# Patient Record
Sex: Male | Born: 1935
Health system: Southern US, Community
[De-identification: ages and names within clinical notes are randomized; demographics above are authoritative.]

## PROBLEM LIST (undated history)

## (undated) DIAGNOSIS — D472 Monoclonal gammopathy: Secondary | ICD-10-CM

## (undated) DIAGNOSIS — I480 Paroxysmal atrial fibrillation: Secondary | ICD-10-CM

## (undated) DIAGNOSIS — I498 Other specified cardiac arrhythmias: Secondary | ICD-10-CM

## (undated) DIAGNOSIS — Z8679 Personal history of other diseases of the circulatory system: Secondary | ICD-10-CM

## (undated) DIAGNOSIS — G7000 Myasthenia gravis without (acute) exacerbation: Secondary | ICD-10-CM

## (undated) DIAGNOSIS — E0590 Thyrotoxicosis, unspecified without thyrotoxic crisis or storm: Secondary | ICD-10-CM

## (undated) DIAGNOSIS — I350 Nonrheumatic aortic (valve) stenosis: Secondary | ICD-10-CM

## (undated) DIAGNOSIS — R0600 Dyspnea, unspecified: Secondary | ICD-10-CM

## (undated) DIAGNOSIS — K5792 Diverticulitis of intestine, part unspecified, without perforation or abscess without bleeding: Secondary | ICD-10-CM

## (undated) DIAGNOSIS — I2510 Atherosclerotic heart disease of native coronary artery without angina pectoris: Secondary | ICD-10-CM

## (undated) DIAGNOSIS — E785 Hyperlipidemia, unspecified: Secondary | ICD-10-CM

## (undated) DIAGNOSIS — R011 Cardiac murmur, unspecified: Secondary | ICD-10-CM

## (undated) DIAGNOSIS — G909 Disorder of the autonomic nervous system, unspecified: Secondary | ICD-10-CM

## (undated) DIAGNOSIS — R7303 Prediabetes: Secondary | ICD-10-CM

## (undated) DIAGNOSIS — D6859 Other primary thrombophilia: Secondary | ICD-10-CM

## (undated) DIAGNOSIS — I5022 Chronic systolic (congestive) heart failure: Secondary | ICD-10-CM

## (undated) DIAGNOSIS — M1990 Unspecified osteoarthritis, unspecified site: Secondary | ICD-10-CM

## (undated) DIAGNOSIS — I495 Sick sinus syndrome: Secondary | ICD-10-CM

## (undated) DIAGNOSIS — I639 Cerebral infarction, unspecified: Secondary | ICD-10-CM

## (undated) DIAGNOSIS — N400 Enlarged prostate without lower urinary tract symptoms: Secondary | ICD-10-CM

## (undated) DIAGNOSIS — E039 Hypothyroidism, unspecified: Secondary | ICD-10-CM

## (undated) HISTORY — PX: OTHER SURGICAL HISTORY: SHX169

## (undated) HISTORY — DX: Other specified cardiac arrhythmias: I49.8

## (undated) HISTORY — DX: Dyspnea, unspecified: R06.00

## (undated) HISTORY — DX: Unspecified osteoarthritis, unspecified site: M19.90

## (undated) HISTORY — PX: APPENDECTOMY: SHX54

## (undated) HISTORY — DX: Atherosclerotic heart disease of native coronary artery without angina pectoris: I25.10

## (undated) HISTORY — PX: COLONOSCOPY: SHX174

## (undated) HISTORY — DX: Monoclonal gammopathy: D47.2

## (undated) HISTORY — DX: Disorder of the autonomic nervous system, unspecified: G90.9

## (undated) HISTORY — PX: CARDIOVERSION: SHX1299

## (undated) HISTORY — DX: Chronic systolic (congestive) heart failure: I50.22

## (undated) HISTORY — DX: Other primary thrombophilia: D68.59

## (undated) HISTORY — DX: Hyperlipidemia, unspecified: E78.5

## (undated) HISTORY — DX: Sick sinus syndrome: I49.5

## (undated) HISTORY — DX: Prediabetes: R73.03

## (undated) HISTORY — DX: Paroxysmal atrial fibrillation: I48.0

## (undated) HISTORY — DX: Personal history of other diseases of the circulatory system: Z86.79

## (undated) HISTORY — DX: Myasthenia gravis without (acute) exacerbation: G70.00

## (undated) HISTORY — DX: Diverticulitis of intestine, part unspecified, without perforation or abscess without bleeding: K57.92

## (undated) HISTORY — DX: Nonrheumatic aortic (valve) stenosis: I35.0

## (undated) HISTORY — PX: CARDIAC CATHETERIZATION: SHX172

---

## 1989-03-01 HISTORY — PX: PACEMAKER INSERTION: SHX728

## 1998-02-06 ENCOUNTER — Ambulatory Visit (HOSPITAL_COMMUNITY): Admission: RE | Admit: 1998-02-06 | Discharge: 1998-02-06 | Payer: Self-pay | Admitting: Cardiovascular Disease

## 1998-02-06 ENCOUNTER — Encounter: Payer: Self-pay | Admitting: Cardiovascular Disease

## 2000-04-15 ENCOUNTER — Encounter: Payer: Self-pay | Admitting: Internal Medicine

## 2000-04-15 ENCOUNTER — Ambulatory Visit (HOSPITAL_COMMUNITY): Admission: RE | Admit: 2000-04-15 | Discharge: 2000-04-15 | Payer: Self-pay | Admitting: Internal Medicine

## 2002-08-24 ENCOUNTER — Ambulatory Visit (HOSPITAL_COMMUNITY): Admission: RE | Admit: 2002-08-24 | Discharge: 2002-08-25 | Payer: Self-pay | Admitting: Cardiology

## 2003-01-07 ENCOUNTER — Encounter: Admission: RE | Admit: 2003-01-07 | Discharge: 2003-01-07 | Payer: Self-pay | Admitting: General Surgery

## 2003-01-18 ENCOUNTER — Encounter: Admission: RE | Admit: 2003-01-18 | Discharge: 2003-01-18 | Payer: Self-pay | Admitting: General Surgery

## 2003-02-18 ENCOUNTER — Encounter (INDEPENDENT_AMBULATORY_CARE_PROVIDER_SITE_OTHER): Payer: Self-pay | Admitting: Specialist

## 2003-02-18 ENCOUNTER — Observation Stay (HOSPITAL_COMMUNITY): Admission: RE | Admit: 2003-02-18 | Discharge: 2003-02-20 | Payer: Self-pay | Admitting: General Surgery

## 2003-05-10 ENCOUNTER — Inpatient Hospital Stay (HOSPITAL_COMMUNITY): Admission: AD | Admit: 2003-05-10 | Discharge: 2003-05-13 | Payer: Self-pay | Admitting: Cardiology

## 2003-05-20 ENCOUNTER — Ambulatory Visit (HOSPITAL_COMMUNITY): Admission: RE | Admit: 2003-05-20 | Discharge: 2003-05-20 | Payer: Self-pay | Admitting: Cardiology

## 2003-07-02 ENCOUNTER — Ambulatory Visit (HOSPITAL_COMMUNITY): Admission: RE | Admit: 2003-07-02 | Discharge: 2003-07-02 | Payer: Self-pay | Admitting: *Deleted

## 2004-02-14 ENCOUNTER — Ambulatory Visit: Payer: Self-pay | Admitting: Cardiology

## 2004-02-28 ENCOUNTER — Ambulatory Visit: Payer: Self-pay

## 2004-09-14 ENCOUNTER — Ambulatory Visit: Payer: Self-pay | Admitting: Internal Medicine

## 2004-10-01 ENCOUNTER — Ambulatory Visit: Payer: Self-pay | Admitting: Internal Medicine

## 2004-10-07 ENCOUNTER — Ambulatory Visit: Payer: Self-pay | Admitting: Internal Medicine

## 2004-10-21 ENCOUNTER — Ambulatory Visit: Payer: Self-pay | Admitting: Internal Medicine

## 2004-11-17 ENCOUNTER — Ambulatory Visit: Payer: Self-pay | Admitting: Cardiology

## 2004-11-23 ENCOUNTER — Ambulatory Visit: Payer: Self-pay | Admitting: Cardiology

## 2004-11-23 ENCOUNTER — Encounter: Payer: Self-pay | Admitting: Cardiology

## 2004-11-23 ENCOUNTER — Ambulatory Visit: Payer: Self-pay

## 2005-06-28 ENCOUNTER — Ambulatory Visit: Payer: Self-pay | Admitting: Cardiology

## 2005-12-31 ENCOUNTER — Ambulatory Visit: Payer: Self-pay | Admitting: Internal Medicine

## 2006-01-28 ENCOUNTER — Encounter: Payer: Self-pay | Admitting: Cardiology

## 2006-01-28 ENCOUNTER — Ambulatory Visit: Payer: Self-pay | Admitting: Cardiology

## 2006-01-28 ENCOUNTER — Ambulatory Visit: Payer: Self-pay

## 2006-04-22 ENCOUNTER — Ambulatory Visit: Payer: Self-pay | Admitting: Internal Medicine

## 2006-05-02 ENCOUNTER — Ambulatory Visit (HOSPITAL_COMMUNITY): Admission: RE | Admit: 2006-05-02 | Discharge: 2006-05-02 | Payer: Self-pay | Admitting: Internal Medicine

## 2006-05-02 ENCOUNTER — Ambulatory Visit: Payer: Self-pay | Admitting: Internal Medicine

## 2006-05-06 ENCOUNTER — Ambulatory Visit: Payer: Self-pay

## 2006-05-09 ENCOUNTER — Ambulatory Visit: Payer: Self-pay | Admitting: Cardiology

## 2006-05-17 ENCOUNTER — Ambulatory Visit: Payer: Self-pay | Admitting: Internal Medicine

## 2006-05-19 ENCOUNTER — Ambulatory Visit: Payer: Self-pay | Admitting: Internal Medicine

## 2006-05-27 ENCOUNTER — Ambulatory Visit: Payer: Self-pay | Admitting: Cardiology

## 2006-05-27 ENCOUNTER — Inpatient Hospital Stay (HOSPITAL_BASED_OUTPATIENT_CLINIC_OR_DEPARTMENT_OTHER): Admission: RE | Admit: 2006-05-27 | Discharge: 2006-05-27 | Payer: Self-pay | Admitting: Cardiology

## 2006-06-02 ENCOUNTER — Ambulatory Visit: Payer: Self-pay | Admitting: Internal Medicine

## 2006-06-06 ENCOUNTER — Ambulatory Visit: Payer: Self-pay | Admitting: Internal Medicine

## 2006-06-09 ENCOUNTER — Ambulatory Visit: Payer: Self-pay | Admitting: Cardiology

## 2006-06-09 LAB — CONVERTED CEMR LAB
Basophils Absolute: 0 10*3/uL (ref 0.0–0.1)
Hemoglobin: 11.7 g/dL — ABNORMAL LOW (ref 13.0–17.0)
Lymphocytes Relative: 22.8 % (ref 12.0–46.0)
MCHC: 34 g/dL (ref 30.0–36.0)
MCV: 86.9 fL (ref 78.0–100.0)
Monocytes Absolute: 0.5 10*3/uL (ref 0.2–0.7)
Monocytes Relative: 9 % (ref 3.0–11.0)
Neutro Abs: 3.8 10*3/uL (ref 1.4–7.7)
Neutrophils Relative %: 63.4 % (ref 43.0–77.0)

## 2006-06-11 ENCOUNTER — Encounter: Payer: Self-pay | Admitting: Cardiology

## 2006-06-11 LAB — CONVERTED CEMR LAB: Prothrombin Time: 27.7 s — ABNORMAL HIGH (ref 11.6–15.2)

## 2006-06-13 ENCOUNTER — Ambulatory Visit: Payer: Self-pay | Admitting: Cardiology

## 2006-07-12 ENCOUNTER — Ambulatory Visit: Payer: Self-pay | Admitting: Cardiology

## 2006-08-16 ENCOUNTER — Ambulatory Visit: Payer: Self-pay | Admitting: Cardiology

## 2006-08-22 ENCOUNTER — Ambulatory Visit (HOSPITAL_COMMUNITY): Admission: RE | Admit: 2006-08-22 | Discharge: 2006-08-22 | Payer: Self-pay | Admitting: Cardiology

## 2006-08-22 ENCOUNTER — Ambulatory Visit: Payer: Self-pay | Admitting: Cardiology

## 2006-08-30 ENCOUNTER — Ambulatory Visit: Payer: Self-pay | Admitting: Cardiology

## 2006-09-16 ENCOUNTER — Ambulatory Visit: Payer: Self-pay | Admitting: Cardiology

## 2006-10-07 ENCOUNTER — Ambulatory Visit: Payer: Self-pay | Admitting: Cardiology

## 2006-10-27 ENCOUNTER — Ambulatory Visit: Payer: Self-pay | Admitting: Cardiology

## 2006-11-25 ENCOUNTER — Encounter: Payer: Self-pay | Admitting: Cardiology

## 2006-11-25 ENCOUNTER — Ambulatory Visit: Payer: Self-pay

## 2007-01-04 ENCOUNTER — Ambulatory Visit: Payer: Self-pay | Admitting: Cardiology

## 2007-02-14 ENCOUNTER — Ambulatory Visit: Payer: Self-pay | Admitting: Cardiology

## 2007-04-10 ENCOUNTER — Ambulatory Visit: Payer: Self-pay | Admitting: Cardiology

## 2007-05-12 ENCOUNTER — Ambulatory Visit: Payer: Self-pay | Admitting: Cardiology

## 2007-05-12 ENCOUNTER — Ambulatory Visit: Payer: Self-pay | Admitting: Internal Medicine

## 2007-05-18 ENCOUNTER — Ambulatory Visit: Payer: Self-pay | Admitting: Cardiology

## 2007-05-22 ENCOUNTER — Encounter: Admission: RE | Admit: 2007-05-22 | Discharge: 2007-05-22 | Payer: Self-pay | Admitting: Specialist

## 2007-06-14 ENCOUNTER — Ambulatory Visit: Payer: Self-pay | Admitting: Cardiology

## 2007-09-12 ENCOUNTER — Ambulatory Visit: Payer: Self-pay | Admitting: Cardiology

## 2007-10-12 ENCOUNTER — Ambulatory Visit: Payer: Self-pay | Admitting: Internal Medicine

## 2007-12-04 ENCOUNTER — Encounter: Admission: RE | Admit: 2007-12-04 | Discharge: 2007-12-04 | Payer: Self-pay | Admitting: Orthopedic Surgery

## 2007-12-08 DIAGNOSIS — I495 Sick sinus syndrome: Secondary | ICD-10-CM | POA: Insufficient documentation

## 2007-12-08 DIAGNOSIS — I359 Nonrheumatic aortic valve disorder, unspecified: Secondary | ICD-10-CM | POA: Insufficient documentation

## 2007-12-08 DIAGNOSIS — I4891 Unspecified atrial fibrillation: Secondary | ICD-10-CM | POA: Insufficient documentation

## 2007-12-08 DIAGNOSIS — I2510 Atherosclerotic heart disease of native coronary artery without angina pectoris: Secondary | ICD-10-CM | POA: Insufficient documentation

## 2007-12-08 DIAGNOSIS — E785 Hyperlipidemia, unspecified: Secondary | ICD-10-CM | POA: Insufficient documentation

## 2007-12-08 DIAGNOSIS — D6859 Other primary thrombophilia: Secondary | ICD-10-CM | POA: Insufficient documentation

## 2007-12-08 DIAGNOSIS — I25118 Atherosclerotic heart disease of native coronary artery with other forms of angina pectoris: Secondary | ICD-10-CM | POA: Insufficient documentation

## 2007-12-08 DIAGNOSIS — E782 Mixed hyperlipidemia: Secondary | ICD-10-CM | POA: Insufficient documentation

## 2008-01-19 ENCOUNTER — Ambulatory Visit: Payer: Self-pay | Admitting: Cardiovascular Disease

## 2008-02-20 ENCOUNTER — Ambulatory Visit: Payer: Self-pay | Admitting: Cardiology

## 2008-06-20 ENCOUNTER — Encounter (INDEPENDENT_AMBULATORY_CARE_PROVIDER_SITE_OTHER): Payer: Self-pay | Admitting: *Deleted

## 2008-07-04 ENCOUNTER — Encounter: Payer: Self-pay | Admitting: Cardiology

## 2008-07-30 ENCOUNTER — Encounter: Payer: Self-pay | Admitting: *Deleted

## 2008-08-16 ENCOUNTER — Ambulatory Visit: Payer: Self-pay | Admitting: Cardiology

## 2008-08-16 ENCOUNTER — Encounter: Payer: Self-pay | Admitting: Internal Medicine

## 2008-08-16 ENCOUNTER — Encounter: Payer: Self-pay | Admitting: Cardiology

## 2008-08-16 ENCOUNTER — Encounter (INDEPENDENT_AMBULATORY_CARE_PROVIDER_SITE_OTHER): Payer: Self-pay | Admitting: Cardiology

## 2008-08-23 ENCOUNTER — Ambulatory Visit: Payer: Self-pay | Admitting: Cardiology

## 2008-09-04 ENCOUNTER — Encounter: Payer: Self-pay | Admitting: *Deleted

## 2008-10-09 ENCOUNTER — Encounter: Payer: Self-pay | Admitting: Cardiology

## 2008-10-30 ENCOUNTER — Ambulatory Visit: Payer: Self-pay | Admitting: Internal Medicine

## 2008-12-30 ENCOUNTER — Encounter: Payer: Self-pay | Admitting: Cardiology

## 2008-12-30 LAB — CONVERTED CEMR LAB: POC INR: 2

## 2009-01-27 ENCOUNTER — Ambulatory Visit: Payer: Self-pay | Admitting: Cardiology

## 2009-01-27 ENCOUNTER — Encounter (INDEPENDENT_AMBULATORY_CARE_PROVIDER_SITE_OTHER): Payer: Self-pay | Admitting: Cardiology

## 2009-01-27 ENCOUNTER — Ambulatory Visit (HOSPITAL_COMMUNITY): Admission: RE | Admit: 2009-01-27 | Discharge: 2009-01-27 | Payer: Self-pay | Admitting: Cardiology

## 2009-01-27 ENCOUNTER — Encounter: Payer: Self-pay | Admitting: Cardiology

## 2009-01-27 ENCOUNTER — Ambulatory Visit: Payer: Self-pay

## 2009-01-27 LAB — CONVERTED CEMR LAB: POC INR: 2

## 2009-01-31 ENCOUNTER — Ambulatory Visit: Payer: Self-pay | Admitting: Cardiology

## 2009-02-03 ENCOUNTER — Ambulatory Visit: Payer: Self-pay | Admitting: Cardiology

## 2009-02-14 ENCOUNTER — Encounter: Payer: Self-pay | Admitting: Cardiology

## 2009-02-14 ENCOUNTER — Ambulatory Visit: Payer: Self-pay

## 2009-02-27 ENCOUNTER — Telehealth: Payer: Self-pay | Admitting: Cardiology

## 2009-03-06 ENCOUNTER — Encounter: Payer: Self-pay | Admitting: Cardiology

## 2009-04-14 ENCOUNTER — Ambulatory Visit: Payer: Self-pay | Admitting: Cardiology

## 2009-06-02 ENCOUNTER — Encounter: Payer: Self-pay | Admitting: Cardiovascular Disease

## 2009-06-02 ENCOUNTER — Ambulatory Visit: Payer: Self-pay | Admitting: Cardiovascular Disease

## 2009-06-02 LAB — CONVERTED CEMR LAB: POC INR: 1.5

## 2009-06-16 ENCOUNTER — Encounter: Payer: Self-pay | Admitting: Cardiology

## 2009-07-08 ENCOUNTER — Encounter: Payer: Self-pay | Admitting: Cardiology

## 2009-07-11 ENCOUNTER — Telehealth: Payer: Self-pay | Admitting: Cardiology

## 2009-07-11 ENCOUNTER — Encounter: Payer: Self-pay | Admitting: Cardiology

## 2009-07-11 LAB — CONVERTED CEMR LAB: POC INR: 2

## 2009-09-08 ENCOUNTER — Ambulatory Visit: Payer: Self-pay | Admitting: Internal Medicine

## 2009-09-08 LAB — CONVERTED CEMR LAB: INR: 2.1

## 2009-12-10 ENCOUNTER — Encounter: Payer: Self-pay | Admitting: Cardiology

## 2009-12-19 ENCOUNTER — Ambulatory Visit: Payer: Self-pay | Admitting: Cardiovascular Disease

## 2009-12-19 ENCOUNTER — Encounter: Payer: Self-pay | Admitting: Cardiology

## 2009-12-19 ENCOUNTER — Ambulatory Visit (HOSPITAL_COMMUNITY): Admission: RE | Admit: 2009-12-19 | Discharge: 2009-12-19 | Payer: Self-pay | Admitting: Cardiology

## 2009-12-19 ENCOUNTER — Ambulatory Visit: Payer: Self-pay

## 2009-12-22 ENCOUNTER — Ambulatory Visit: Payer: Self-pay | Admitting: Cardiology

## 2009-12-25 ENCOUNTER — Ambulatory Visit: Payer: Self-pay | Admitting: Internal Medicine

## 2009-12-26 ENCOUNTER — Encounter (INDEPENDENT_AMBULATORY_CARE_PROVIDER_SITE_OTHER): Payer: Self-pay | Admitting: *Deleted

## 2010-02-17 ENCOUNTER — Encounter (INDEPENDENT_AMBULATORY_CARE_PROVIDER_SITE_OTHER): Payer: Self-pay | Admitting: *Deleted

## 2010-03-06 ENCOUNTER — Encounter: Payer: Self-pay | Admitting: Internal Medicine

## 2010-03-06 ENCOUNTER — Ambulatory Visit
Admission: RE | Admit: 2010-03-06 | Discharge: 2010-03-06 | Payer: Self-pay | Source: Home / Self Care | Attending: Internal Medicine | Admitting: Internal Medicine

## 2010-03-31 NOTE — Letter (Signed)
Summary: Medical History  Medical History   Imported By: Roderic Ovens 08/13/2009 09:26:10  _____________________________________________________________________  External Attachment:    Type:   Image     Comment:   External Document  Appended Document: Medical History Preliminarily reviewed. Forwarded to MD desktop for review and signature

## 2010-03-31 NOTE — Medication Information (Signed)
Summary: [facility]in Clinic  Anticoagulant Therapy  Managed by: Bethena Midget, RN, BSN Referring MD: Legrand Rams MD: Antoine Poche MD, Fayrene Fearing Indication 1: Atrial Fibrillation (ICD-427.31) Lab Used: [facility] Site: [facility] INR POC 2.0 INR RANGE 2-3  Dietary changes: no    Health status changes: no    Bleeding/hemorrhagic complications: no    Recent/future hospitalizations: no    Any changes in medication regimen? no    Recent/future dental: no  Any missed doses?: no       Is patient compliant with meds? yes       Allergies: No Known Drug Allergies  Anticoagulation Management History:      The patient is taking warfarin and comes in today for a routine follow up visit.  Positive risk factors for bleeding include an age of 74 years or older.  The bleeding index is 'intermediate risk'.  Negative CHADS2 values include Age > 54 years old.  The start date was 05/31/2006.  His last INR was 2.4.  Anticoagulation responsible provider: Antoine Poche MD, Fayrene Fearing.  INR POC: 2.0.  Cuvette Lot#: [id number].  Exp: 09/2010.    Anticoagulation Management Assessment/Plan:      The patient's current anticoagulation dose is Warfarin sodium 4 mg tabs: Use as directed by Anticoagulation Clinic.  The target INR is 2.0-3.0.  The next INR is due 08/01/2009.  Anticoagulation instructions were given to patient.  Results were reviewed/authorized by Bethena Midget, RN, BSN.  He was notified by Bethena Midget, RN, BSN.         Prior Anticoagulation Instructions: INR 1.6  Take 6mg  x 2 days then increase dose to 4mg  every day except 6mg  on Monday and Friday   Current Anticoagulation Instructions: INR 2.0 Continue 4mg s daily except 6mg s on Mondays and Fridays. Recheck in 3 weeks.

## 2010-03-31 NOTE — Medication Information (Signed)
Summary: Coumadin Clinic  Anticoagulant Therapy  Managed by: Weston Brass, PharmD Referring MD: Legrand Rams MD: [name] MD, Tessa Seaberry Indication 1: Atrial Fibrillation (ICD-427.31) Lab Used: [facility] Site: [facility] INR POC 1.6 INR RANGE 2-3  Dietary changes: no    Health status changes: no    Bleeding/hemorrhagic complications: no    Recent/future hospitalizations: no    Any changes in medication regimen? no    Recent/future dental: no  Any missed doses?: no       Is patient compliant with meds? yes       Allergies: No Known Drug Allergies  Anticoagulation Management History:      The patient is taking warfarin and comes in today for a routine follow up visit.  Positive risk factors for bleeding include an age of 74 years or older.  The bleeding index is 'intermediate risk'.  Negative CHADS2 values include Age > 74 years old.  The start date was 05/31/2006.  His last INR was 2.4.  Anticoagulation responsible provider: [name] MD, Smitty Cords.  INR POC: 1.6.  Cuvette Lot#: [id number].  Exp: 11/2009.    Anticoagulation Management Assessment/Plan:      The patient's current anticoagulation dose is Warfarin sodium 4 mg tabs: Use as directed by Anticoagulation Clinic.  The target INR is 2.0-3.0.  The next INR is due 06/30/2009.  Anticoagulation instructions were given to patient.  Results were reviewed/authorized by Weston Brass, PharmD.  He was notified by Weston Brass PharmD.         Prior Anticoagulation Instructions: INR 1.5  Take 6mg  x 2 days then continue 4mg  daily   Current Anticoagulation Instructions: INR 1.6  Take 6mg  x 2 days then increase dose to 4mg  every day except 6mg  on Monday and Friday

## 2010-03-31 NOTE — Assessment & Plan Note (Signed)
Summary: PT/RCD   Nurse Visit   Allergies: No Known Drug Allergies Laboratory Results   Blood Tests   Date/Time Received: December 25, 2009 2:47 PM  Date/Time Reported: December 25, 2009 2:47 PM    INR: 2.0   (Normal Range: 0.88-1.12   Therap INR: 2.0-3.5) Comments: Wynona Canes, CMA  December 25, 2009 2:47 PM     Orders Added: 1)  Est. Patient Level I [99211] 2)  Protime [[id number]]  Laboratory Results   Blood Tests      INR: 2.0   (Normal Range: 0.88-1.12   Therap INR: 2.0-3.5) Comments: Wynona Canes, CMA  December 25, 2009 2:47 PM       ANTICOAGULATION RECORD PREVIOUS REGIMEN & LAB RESULTS Anticoagulation Diagnosis:  Atrial Fibrillation (ICD-427.31) on  07/12/2008 Previous INR Goal Range:  2-3 on  10/09/2008 Previous INR:  2.1 on  09/08/2009 Previous Coumadin Dose(mg):  4mg  on  06/16/2009 Previous Regimen:  same on  09/08/2009  NEW REGIMEN & LAB RESULTS Current INR: 2.0 Regimen: same  (no change)       Repeat testing in: 4 weeks MEDICATIONS FLOMAX 0.4 MG XR24H-CAP (TAMSULOSIN HCL) 1 by mouth daily ACTOS 30 MG TABS (PIOGLITAZONE HCL) 1 tab once daily CRESTOR 40 MG TABS (ROSUVASTATIN CALCIUM) Take one tablet by mouth daily. ASPIRIN 81 MG TBEC (ASPIRIN) Take one tablet by mouth daily POTASSIUM CHLORIDE CR 10 MEQ CR-TABS (POTASSIUM CHLORIDE) 2 TABS once daily * CO Q10 300 MG TABS (COENZYME Q10) 1 daily WARFARIN SODIUM 4 MG TABS (WARFARIN SODIUM) Use as directed by Anticoagulation Clinic LEVOTHROID 50 MCG TABS (LEVOTHYROXINE SODIUM) 1 daily. AMIODARONE HCL 200 MG TABS (AMIODARONE HCL) take one tab by mouth every other day alternating with 1/2 tab every other day METOPROLOL SUCCINATE 25 MG XR24H-TAB (METOPROLOL SUCCINATE) Take one tablet by mouth daily HYDROCHLOROTHIAZIDE 25 MG TABS (HYDROCHLOROTHIAZIDE) Take a half to one whole tab by mouth once daily as needed edema   Anticoagulation Visit Questionnaire      Coumadin dose missed/changed:  No  Abnormal Bleeding Symptoms:  No   Any diet changes including alcohol intake, vegetables or greens since the last visit:  No Any illnesses or hospitalizations since the last visit:  No Any signs of clotting since the last visit (including chest discomfort, dizziness, shortness of breath, arm tingling, slurred speech, swelling or redness in leg):  No

## 2010-03-31 NOTE — Assessment & Plan Note (Signed)
Summary: pt--ok per dr k//ccm   Nurse Visit   Allergies: No Known Drug Allergies Laboratory Results   Blood Tests      INR: 2.1   (Normal Range: 0.88-1.12   Therap INR: 2.0-3.5) Comments: Rita Ohara  September 08, 2009 1:31 PM     Orders Added: 1)  Est. Patient Level I [99211] 2)  Protime [[id number]]   ANTICOAGULATION RECORD PREVIOUS REGIMEN & LAB RESULTS Anticoagulation Diagnosis:  Atrial Fibrillation (ICD-427.31) on  07/12/2008 Previous INR Goal Range:  2-3 on  10/09/2008 Previous INR:  2.4 on  06/11/2006 Previous Coumadin Dose(mg):  4mg  on  06/16/2009   NEW REGIMEN & LAB RESULTS Current INR: 2.1 Regimen: same  Repeat testing in: 4 weeks  Anticoagulation Visit Questionnaire Coumadin dose missed/changed:  No Abnormal Bleeding Symptoms:  No  Any diet changes including alcohol intake, vegetables or greens since the last visit:  No Any illnesses or hospitalizations since the last visit:  No Any signs of clotting since the last visit (including chest discomfort, dizziness, shortness of breath, arm tingling, slurred speech, swelling or redness in leg):  No  MEDICATIONS FLOMAX 0.4 MG XR24H-CAP (TAMSULOSIN HCL) 1 by mouth daily ACTOS 30 MG TABS (PIOGLITAZONE HCL) 1 tab once daily CRESTOR 10 MG TABS (ROSUVASTATIN CALCIUM) Take one tablet by mouth daily. ASPIRIN 81 MG TBEC (ASPIRIN) Take one tablet by mouth daily POTASSIUM CHLORIDE CR 10 MEQ CR-TABS (POTASSIUM CHLORIDE) 2 TABS once daily * CO Q10 300 MG TABS (COENZYME Q10) 1 daily WARFARIN SODIUM 4 MG TABS (WARFARIN SODIUM) Use as directed by Anticoagulation Clinic LEVOTHROID 50 MCG TABS (LEVOTHYROXINE SODIUM) 1 daily. AMIODARONE HCL 200 MG TABS (AMIODARONE HCL) take one tab by mouth every other day alternating with 1/2 tab every other day METOPROLOL SUCCINATE 25 MG XR24H-TAB (METOPROLOL SUCCINATE) Take one tablet by mouth daily HYDROCHLOROTHIAZIDE 25 MG TABS (HYDROCHLOROTHIAZIDE) Take a half to one whole tab by mouth  once daily as needed edema

## 2010-03-31 NOTE — Assessment & Plan Note (Signed)
Summary: rov  Medications Added ACTOS 30 MG TABS (PIOGLITAZONE HCL) 1 tab once daily      Allergies Added: NKDA  Visit Type:  Pacemaker check  CC:  abdominal pain.  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 he had CAD and had a previous drug stent placed in the circumflex artery. He also has microvascular angina. He also has a history of paroxysmal atrial fibrillation which is controlled with amiodarone and he has a pacemaker which is now programmed to AAI mode. He is on chronic Coumadin therapy for this although he stopped that when he went skiing recently after discussions here. He also has aortic stenosis and had an echocardiogram in November of 2010 which showed a mean gradient of 20 mm of mercury. The mean gradient was 13 mm in 2008.  He has been doing well. He just got back from a skiing trip in [place]. He had minimal chest pain. He did laboratory work in his office and his LDL was 78, his HDL was 82, his total cholesterol is 135. He said he had a normal TSH and normal liver function tests.  Current Medications (verified): 1)  Flomax 0.4 Mg Xr24h-Cap (Tamsulosin Hcl) .Marland Kitchen.. 1 By Mouth Daily 2)  Actos 30 Mg Tabs (Pioglitazone Hcl) .Marland Kitchen.. 1 Tab Once Daily 3)  Crestor 20 Mg Tabs (Rosuvastatin Calcium) .... Take One Tablet By Mouth Daily. 4)  Aspirin 81 Mg Tbec (Aspirin) .... Take One Tablet By Mouth Daily 5)  Potassium Chloride Cr 10 Meq Cr-Tabs (Potassium Chloride) .... 2 Tabs Once Daily 6)  Co Q10 300 Mg Tabs (Coenzyme Q10) .Marland Kitchen.. 1 Daily 7)  Warfarin Sodium 4 Mg Tabs (Warfarin Sodium) .... Use As Directed By Anticoagulation Clinic 8)  Levothroid 50 Mcg Tabs (Levothyroxine Sodium) .Marland Kitchen.. 1 Daily. 9)  Amiodarone Hcl 200 Mg Tabs (Amiodarone Hcl) .... Take One Tab By Mouth Every Other Day Alternating With 1/2 Tab Every Other Day 10)  Metoprolol Succinate 25 Mg Xr24h-Tab (Metoprolol Succinate) .... Take One  Tablet By Mouth Daily 11)  Hydrochlorothiazide 25 Mg Tabs (Hydrochlorothiazide) .... Take A Half To One Whole Tab By Mouth Once Daily As Needed Edema  Allergies (verified): No Known Drug Allergies  Past History:  Past Medical History: Reviewed history from 12/08/2007 and no changes required. 1. Recent palpitations apparently related to intermittent atrial     bigeminy. 2. Paroxysmal atrial fibrillation, now controlled on amiodarone. 3. Status post Guidant dual-mode, dual-pacing, dual-sensing  pacemaker     implantation now programmed to AAI with recent generator change. 4. Coronary artery disease status post multiple prior percutaneous     coronary interventions with coronary anatomy as described above. 5. Microvascular angina. 6. Mild aortic stenosis with aortic valve gradient of 13 mmHg. 7. Hypercoagulable state. 8. Hyperlipidemia. 9. Degenerative disease, left shoulder.   Review of Systems       ROS is negative except as outlined in HPI.   Vital Signs:  Patient profile:   76 year old male Height:      74 inches Weight:      215 pounds Pulse rate:   60 / minute BP sitting:   130 / 72  (left arm) Cuff size:   large  Vitals Entered By: Burnett Kanaris, CNA (April 14, 2009 2:11 PM)  Physical Exam  Additional Exam:  Gen. Well-nourished, in no distress   Neck: No JVD, thyroid not enlarged, no carotid bruits Lungs: No  tachypnea, clear without rales, rhonchi or wheezes Cardiovascular: Rhythm regular, PMI not displaced,  heart sounds  normal, grade 2-3/6 systolic ejection murmur radiating to the base into the neck, no peripheral edema, pulses normal in all 4 extremities. Abdomen: BS normal, abdomen soft and non-tender without masses or organomegaly, no hepatosplenomegaly. MS: No deformities, no cyanosis or clubbing   Neuro:  No focal sns   Skin:  no lesions    PPM Specifications Following MD:  Everardo Beals. [name], MD     PPM VendorAurelio Jew     PPM Model Number:  [id number]      PPM Serial Number:  [id number] DOI:  08/22/2006     PPM Implanting MD:  Sherryl Manges, MD  Lead 1    Location: [facility]     DOI: 05/13/1989     Model #: [id number]     Serial #: [id number]     Status: active Lead 2    Location: [facility]     DOI: 05/13/1989     Model #: [id number]     Serial #: [id number]     Status: active   Indications:  Sick sinus syndrome   Episodes Coumadin:  Yes  Parameters Mode:  AAIR     Lower Rate Limit:  60     Upper Rate Limit:  100 Rate Response Parameters:  Accelorometer on med., responce factor 8, Minute ventilation turned on with a responsce factor of 3  Impression & Recommendations:  Problem # 1:  CAD, NATIVE VESSEL (ICD-414.01) He has had a previous drug-eluting stent placed to the circumflex artery and also has microvascular angina. He has had minimal angina and these problems. Under good control. His updated medication list for this problem includes:    Aspirin 81 Mg Tbec (Aspirin) .Marland Kitchen... Take one tablet by mouth daily    Warfarin Sodium 4 Mg Tabs (Warfarin sodium) ..... Use as directed by anticoagulation clinic    Metoprolol Succinate 25 Mg Xr24h-tab (Metoprolol succinate) .Marland Kitchen... Take one tablet by mouth daily  Problem # 2:  ATRIAL FIBRILLATION (ICD-427.31) he has a history of paroxysmal atrial fibrillation and is managed with amiodarone and Coumadin. 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 love and we've cut back his amiodarone from 200 mg a day to 150 mg a day. He has had no symptomatic recurrence of atrial fibrillation and has had no recurrence on interrogation of his pacemaker since his last visit. He had recent laboratory studies in his office documenting normal studies related to amiodarone toxicity. His updated medication list for this problem includes:    Aspirin 81 Mg Tbec (Aspirin) .Marland Kitchen... Take one tablet by mouth daily    Warfarin Sodium 4 Mg Tabs (Warfarin sodium) ..... Use as directed by anticoagulation clinic    Amiodarone Hcl 200 Mg Tabs (Amiodarone hcl) .Marland Kitchen... Take one  tab by mouth every other day alternating with 1/2 tab every other day    Metoprolol Succinate 25 Mg Xr24h-tab (Metoprolol succinate) .Marland Kitchen... Take one tablet by mouth daily  Problem # 3:  AORTIC STENOSIS/ INSUFFICIENCY, NON-RHEUMATIC (ICD-424.1) He has mild aortic stenosis with a mean aortic valve gradient of 20 mm. We will plan a repeat echocardiogram one year from his last echocardiogram which will be November of 2011. His updated medication list for this problem includes:    Metoprolol Succinate 25 Mg Xr24h-tab (Metoprolol succinate) .Marland Kitchen... Take one tablet by mouth daily    Hydrochlorothiazide 25 Mg Tabs (Hydrochlorothiazide) .Marland Kitchen... Take a half to one whole tab  by mouth once daily as needed edema  Problem # 4:  PACEMAKER (ICD-V45.Marland Kitchen01) His pacemaker is programmed AAI mode. He has good thresholds and good impedance he is had APCs but no atrial fibrillation.  Patient Instructions: 1)  Your physician wants you to follow-up in: 6 months.   You will receive a reminder letter in the mail two months in advance. If you don't receive a letter, please call our office to schedule the follow-up appointment.

## 2010-03-31 NOTE — Miscellaneous (Signed)
Summary: dx correction   Clinical Lists Changes  Problems: Changed problem from PACEMAKER (ICD-V45.Marland Kitchen01) to PACEMAKER, PERMANENT (ICD-V45.01)  changed the incorrect dx code to correct dx code Genella Mech  December 26, 2009 1:36 PM

## 2010-03-31 NOTE — Assessment & Plan Note (Signed)
Summary: rov  Medications Added CRESTOR 40 MG TABS (ROSUVASTATIN CALCIUM) Take one tablet by mouth daily.        History of Present Illness: [name] returns for followup management of CAD, atrial fibrillation, pacemaker, and aortic stenosis. He has CAD and previously has had a living stents to the circumflex artery. His last catheterization showed nonobstructive disease with the exception of a tight septal perforator. He also has had microvascular angina. He's had only minimal recent chest pain.  He also has paroxysmal atrial fibrillation which is being managed with amiodarone and Coumadin. He had an episode of fibrillation in April and we increased his amiodarone from 100 200 mg daily. This episode lasted about an hour and a half. He has a backup DDD pacemaker which is programmed AAI.  He also has aortic stenosis and we've been following this with serial echocardiograms. He had an echocardiogram prior to this visit. His twin brother [name] also has aortic stenosis and is nearing a time when he may need to have aortic valve replacement.  Current Medications (verified): 1)  Flomax 0.4 Mg Xr24h-Cap (Tamsulosin Hcl) .Marland Kitchen.. 1 By Mouth Daily 2)  Actos 30 Mg Tabs (Pioglitazone Hcl) .Marland Kitchen.. 1 Tab Once Daily 3)  Crestor 10 Mg Tabs (Rosuvastatin Calcium) .... Take One Tablet By Mouth Daily. 4)  Aspirin 81 Mg Tbec (Aspirin) .... Take One Tablet By Mouth Daily 5)  Potassium Chloride Cr 10 Meq Cr-Tabs (Potassium Chloride) .... 2 Tabs Once Daily 6)  Co Q10 300 Mg Tabs (Coenzyme Q10) .Marland Kitchen.. 1 Daily 7)  Warfarin Sodium 4 Mg Tabs (Warfarin Sodium) .... Use As Directed By Anticoagulation Clinic 8)  Levothroid 50 Mcg Tabs (Levothyroxine Sodium) .Marland Kitchen.. 1 Daily. 9)  Amiodarone Hcl 200 Mg Tabs (Amiodarone Hcl) .... Take One Tab By Mouth Every Other Day Alternating With 1/2 Tab Every Other Day 10)  Metoprolol Succinate 25 Mg Xr24h-Tab (Metoprolol Succinate) .... Take One Tablet By Mouth Daily 11)  Hydrochlorothiazide 25 Mg  Tabs (Hydrochlorothiazide) .... Take A Half To One Whole Tab By Mouth Once Daily As Needed Edema  Allergies: No Known Drug Allergies  Past History:  Past Medical History: Reviewed history from 12/08/2007 and no changes required. 1. Recent palpitations apparently related to intermittent atrial     bigeminy. 2. Paroxysmal atrial fibrillation, now controlled on amiodarone. 3. Status post Guidant dual-mode, dual-pacing, dual-sensing  pacemaker     implantation now programmed to AAI with recent generator change. 4. Coronary artery disease status post multiple prior percutaneous     coronary interventions with coronary anatomy as described above. 5. Microvascular angina. 6. Mild aortic stenosis with aortic valve gradient of 13 mmHg. 7. Hypercoagulable state. 8. Hyperlipidemia. 9. Degenerative disease, left shoulder.   Review of Systems       ROS is negative except as outlined in HPI.   Vital Signs:  Patient profile:   76 year old male Height:      74 inches Weight:      215 pounds BMI:     27.70 Pulse rate:   60 / minute Resp:     14 per minute BP sitting:   138 / 80  (left arm)  Vitals Entered By: Kem Parkinson (December 22, 2009 4:12 PM)  Physical Exam  Additional Exam:  Gen. Well-nourished, in no distress   Neck: No JVD, thyroid not enlarged, aortic murmur transferred to the neck Lungs: No tachypnea, clear without rales, rhonchi or wheezes Cardiovascular: Rhythm regular, PMI not displaced,  heart sounds  normal, grade 2-3/6 harsh systolic ejection murmur at the left sternal edge radiating to the base and into the neck., no diastolic murmur,, no peripheral edema, pulses normal in all 4 extremities. Abdomen: BS normal, abdomen soft and non-tender without masses or organomegaly, no hepatosplenomegaly. MS: No deformities, no cyanosis or clubbing   Neuro:  No focal sns   Skin:  no lesions    PPM Specifications Following MD:  Everardo Beals. [name], MD     PPM VendorAurelio Jew      PPM Model Number:  [id number]     PPM Serial Number:  [id number] DOI:  08/22/2006     PPM Implanting MD:  Sherryl Manges, MD  Lead 1    Location: [facility]     DOI: 05/13/1989     Model #: 1028T     Serial #: [id number]     Status: active Lead 2    Location: [facility]     DOI: 05/13/1989     Model #: [id number]     Serial #: [id number]     Status: active   Indications:  Sick sinus syndrome   Episodes Coumadin:  Yes  Parameters Mode:  AAIR     Lower Rate Limit:  60     Upper Rate Limit:  100 Rate Response Parameters:  Accelorometer on med., responce factor 8, Minute ventilation turned on with a responsce factor of 3  Impression & Recommendations:  Problem # 1:  ATRIAL FIBRILLATION (ICD-427.31) He has paroxysmal atrial fibrillation and had one episode of atrial fibrillation lasting an hour and a half in April. His amiodarone was increased to 200 mg daily. His problem now appears under good control. His updated medication list for this problem includes:    Aspirin 81 Mg Tbec (Aspirin) .Marland Kitchen... Take one tablet by mouth daily    Warfarin Sodium 4 Mg Tabs (Warfarin sodium) ..... Use as directed by anticoagulation clinic    Amiodarone Hcl 200 Mg Tabs (Amiodarone hcl) .Marland Kitchen... Take one tab by mouth every other day alternating with 1/2 tab every other day    Metoprolol Succinate 25 Mg Xr24h-tab (Metoprolol succinate) .Marland Kitchen... Take one tablet by mouth daily  Problem # 2:  AORTIC STENOSIS/ INSUFFICIENCY, NON-RHEUMATIC (ICD-424.1) He has moderate aortic stenosis. His recent echocardiogram on 10 /2111 showed a peak velocity of 344 and a mean aortic valve gradient of 27 mm. his echocardiogram in November of 2010 at a peak velocity of 308 and the mean aortic valve gradient of 20 mm. His echocardiogram in June of 2010 showed a peak velocity of 311 and the mean aortic valve gradient of 21 mm. It does not appear that his exercise tolerance has decreased over the past 6-12 months. His remained fairly active and he walks on his treadmill daily. We'll  plan a followup echocardiogram in 6 months prior to his next visit with Dr. Excell Seltzer. His updated medication list for this problem includes:    Metoprolol Succinate 25 Mg Xr24h-tab (Metoprolol succinate) .Marland Kitchen... Take one tablet by mouth daily    Hydrochlorothiazide 25 Mg Tabs (Hydrochlorothiazide) .Marland Kitchen... Take a half to one whole tab by mouth once daily as needed edema  Problem # 3:  PACEMAKER (ICD-V45.Marland Kitchen01) We interrogated his pacemaker today. He is programmed to AAI mode. Has a good threshold. His rate response was somewhat blunted. He had one episode of ventricular fibrillation lasting an hour and a half and a few other episodes lasted a few seconds.  Problem # 4:  CAD, NATIVE VESSEL (ICD-414.01)  He has CAD as described in history of present illness. He's had minimal recent angina and this problem appears stable. His updated medication list for this problem includes:    Aspirin 81 Mg Tbec (Aspirin) .Marland Kitchen... Take one tablet by mouth daily    Warfarin Sodium 4 Mg Tabs (Warfarin sodium) ..... Use as directed by anticoagulation clinic    Metoprolol Succinate 25 Mg Xr24h-tab (Metoprolol succinate) .Marland Kitchen... Take one tablet by mouth daily  Patient Instructions: 1)  Increase Crestor to 40mg  once daily. 2)  Fasting labwork in 1 month: lipid/liver/bmet. 3)  Your physician wants you to follow-up in: 6 months with Dr. Excell Seltzer (April 2012).  You will receive a reminder letter in the mail two months in advance. If you don't receive a letter, please call our office to schedule the follow-up appointment. 4)  Your physician wants you to have a repeat echocardiogram in: 6 months (April 2012)- just prior to your followup appointment with Dr. Excell Seltzer.

## 2010-03-31 NOTE — Progress Notes (Signed)
Summary: Med change  Medications Added CRESTOR 10 MG TABS (ROSUVASTATIN CALCIUM) Take one tablet by mouth daily.       Phone Note Call from Patient   Caller: Patient Call For: Dr. [name] Summary of Call: Per Dr. [name], he spoke with the pt. The pt has cut back his Crestor from 20mg  once daily to 10mg  once daily due to muscle aches and a CK of 260.  Initial call taken by: Sherri Rad, RN, BSN,  Jul 11, 2009 3:02 PM    New/Updated Medications: CRESTOR 10 MG TABS (ROSUVASTATIN CALCIUM) Take one tablet by mouth daily.

## 2010-03-31 NOTE — Medication Information (Signed)
Summary: [facility]  Anticoagulant Therapy  Managed by: Weston Brass, PharmD Referring MD: Legrand Rams MD: Clifton James MD, Cristal Deer Indication 1: Atrial Fibrillation (ICD-427.31) Lab Used: [facility] Site: [facility] INR POC 1.5 INR RANGE 2-3  Dietary changes: no    Health status changes: no    Bleeding/hemorrhagic complications: no    Recent/future hospitalizations: no    Any changes in medication regimen? no    Recent/future dental: no  Any missed doses?: yes     Details: Had held a few doses while skiing in [place] recently   Is patient compliant with meds? yes       Allergies: No Known Drug Allergies  Anticoagulation Management History:      The patient is taking warfarin and comes in today for a routine follow up visit.  Positive risk factors for bleeding include an age of 74 years or older.  The bleeding index is 'intermediate risk'.  Negative CHADS2 values include Age > 42 years old.  The start date was 05/31/2006.  His last INR was 2.4.  Anticoagulation responsible provider: Clifton James MD, Cristal Deer.  INR POC: 1.5.  Cuvette Lot#: [id number].  Exp: 11/2009.    Anticoagulation Management Assessment/Plan:      The patient's current anticoagulation dose is Warfarin sodium 4 mg tabs: Use as directed by Anticoagulation Clinic.  The target INR is 2.0-3.0.  The next INR is due 06/16/2009.  Anticoagulation instructions were given to patient.  Results were reviewed/authorized by Weston Brass, PharmD.  He was notified by Weston Brass PharmD.         Prior Anticoagulation Instructions: INR 2.0  Take extra 2 mg for 1 day then continue 4 mg daily.    Current Anticoagulation Instructions: INR 1.5  Take 6mg  x 2 days then continue 4mg  daily

## 2010-04-02 NOTE — Letter (Signed)
Summary: Generic Letter  [facility], Main Office  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 [name] increased your Crestor to 40mg  at your last office visit on 12/22/2009. If you have already had this done and could forward a copy of your results to [place], we would appreciate it. If you have not had this done, we ask that you do so at your earliest convienience.          Sincerely,    Bruce Brodie,MD Sherri Rad, RN, BSN  This letter has been electronically signed by your physician.

## 2010-04-02 NOTE — Assessment & Plan Note (Signed)
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 He has CAD and previously has had stents to the circumflex artery. His last catheterization showed nonobstructive disease with the exception of a tight septal perforator. He also has had microvascular angina. He's had only minimal recent chest pain.  He also has paroxysmal atrial fibrillation which is being managed with amiodarone and Coumadin. He had an episode of fibrillation in April and we increased his amiodarone from 100 200 mg daily. This episode lasted about an hour and a half. He has a backup DDD pacemaker which is programmed AAI.  He also has aortic stenosis and we've been following this with serial echocardiograms. Last echo in October mean AVG was . (last year gradient was 20)   Previously followed by Dr. [name]. Returns to establish long-term care. Very active, works with trainer 3x/week. Doing weights. Walks on treadmill and uses elliptical. HR won't go up over 105. Still with occasional angina. Also with episodes of palpitations. Both very stable.  Current Medications (verified): 1)  Actos 30 Mg Tabs (Pioglitazone Hcl) .Marland Kitchen.. 1 Tab Once Daily 2)  Crestor 40 Mg Tabs (Rosuvastatin Calcium) .... Take One Tablet By Mouth Daily. 3)  Aspirin 81 Mg Tbec (Aspirin) .... Take One Tablet By Mouth Daily 4)  Potassium Chloride Cr 10 Meq Cr-Tabs (Potassium Chloride) .... 2 Tabs Once Daily 5)  Co Q10 300 Mg Tabs (Coenzyme Q10) .Marland Kitchen.. 1 Daily 6)  Warfarin Sodium 4 Mg Tabs (Warfarin Sodium) .... Use As Directed By Anticoagulation Clinic 7)  Levothroid 75 Mcg Tabs (Levothyroxine Sodium) .... Take 1 Tablet By Mouth Once A Day 8)  Amiodarone Hcl 200 Mg Tabs  (Amiodarone Hcl) .... Take One Tab By Mouth Every Other Day Alternating With 1/2 Tab Every Other Day 9)  Metoprolol Succinate 25 Mg Xr24h-Tab (Metoprolol Succinate) .... Take One Tablet By Mouth Daily 10)  Hydrochlorothiazide 25 Mg Tabs (Hydrochlorothiazide) .... Take A Half To One Whole Tab By Mouth Once Daily As Needed Edema  Allergies (verified): No Known Drug Allergies  Review of Systems       As per HPI and past medical history; otherwise all systems negative.   Vital Signs:  Patient profile:   76 year old male Height:      74 inches Weight:      222 pounds BMI:     28.61 Pulse rate:   64 / minute BP sitting:   138 / 72  (left arm) Cuff size:   large  Vitals Entered By: Hardin Negus, RMA (March 06, 2010 11:37 AM)  Physical Exam  General:  Well appearing. no resp difficulty HEENT: normal Neck: supple. no JVD. Carotids 1+ bilat; bilateral radiated bruits. No lymphadenopathy or thryomegaly appreciated. Cor: PMI nondisplaced. Regular rate & rhythm. No rubs, gallops. 3/6 SEM at RSB. S2 only mildy decreased. Lungs: clear Abdomen: soft, nontender, nondistended. No hepatosplenomegaly. No bruits or masses. Good bowel sounds. Extremities: no cyanosis, clubbing, rash, edema Neuro: alert & orientedx3, cranial nerves grossly intact. moves all 4 extremities w/o difficulty. affect pleasant  Nose:  purulent nasal discharge.     PPM Specifications Following MD:  Everardo Beals. [name], MD     PPM Vendor:  [employer]lio Jew  PPM Model Number:  [id number]     PPM Serial Number:  [id number] PPM DOI:  08/22/2006     PPM Implanting MD:  Sherryl Manges, MD  Lead 1    Location: RA     DOI: 05/13/1989     Model #: [id number]     Serial #: [id number]     Status: active Lead 2    Location: RV     DOI: 05/13/1989     Model #: [id number]     Serial #: [id number]     Status: active   Indications:  Sick sinus syndrome   Episodes Coumadin:  Yes  Parameters Mode:  AAIR     Lower Rate Limit:  60     Upper Rate Limit:  100 Rate  Response Parameters:  Accelorometer on med., responce factor 8, Minute ventilation turned on with a responsce factor of 3  Impression & Recommendations:  Problem # 1:  AORTIC STENOSIS/ INSUFFICIENCY, NON-RHEUMATIC (ICD-424.1) This remains in the moderate range. Recent gradient has increased but exam reassuring. Will repeat echo in march. Will eventually need AVR.  Problem # 2:  CAD, NATIVE VESSEL (ICD-414.01) Stable with stable chronic angina. continue exercise program and current regimen. LDL at goal on Crestor 20.   Problem # 3:  ATRIAL FIBRILLATION (ICD-427.31) Remains on amio/coumadin for PAF. He is doing his own amio surveillance with TFTs, LFTs and CXR.   Other Orders: EKG w/ Interpretation (93000)

## 2010-04-06 ENCOUNTER — Telehealth: Payer: Self-pay | Admitting: Internal Medicine

## 2010-04-06 DIAGNOSIS — R079 Chest pain, unspecified: Secondary | ICD-10-CM | POA: Insufficient documentation

## 2010-04-06 DIAGNOSIS — I2089 Other forms of angina pectoris: Secondary | ICD-10-CM | POA: Insufficient documentation

## 2010-04-06 DIAGNOSIS — I208 Other forms of angina pectoris: Secondary | ICD-10-CM | POA: Insufficient documentation

## 2010-04-07 ENCOUNTER — Encounter: Payer: Self-pay | Admitting: Internal Medicine

## 2010-04-08 ENCOUNTER — Encounter: Payer: Self-pay | Admitting: Internal Medicine

## 2010-04-10 ENCOUNTER — Inpatient Hospital Stay (HOSPITAL_BASED_OUTPATIENT_CLINIC_OR_DEPARTMENT_OTHER)
Admission: RE | Admit: 2010-04-10 | Discharge: 2010-04-10 | Disposition: A | Discharge status: Home / Self Care | Payer: Medicare Other | Source: Ambulatory Visit | Attending: Internal Medicine | Admitting: Internal Medicine

## 2010-04-10 DIAGNOSIS — I2510 Atherosclerotic heart disease of native coronary artery without angina pectoris: Secondary | ICD-10-CM

## 2010-04-16 NOTE — Letter (Signed)
Summary: Cardiac Catheterization Instructions- [facility]  [employer], Main Office  [address]   [place], [place] [address]   Phone: [phone]  Fax: [phone]     04/07/2010 MRN: [id number]  JERRYL HOLZHAUER [address] [place], [place]  [address]  Dear Mr. Heinze,   You are scheduled for a Cardiac Catheterization on Fri. 04/10/10 with Dr.Bensimhon  Please arrive to the 1st floor of the Heart and Vascular Center at [facility] at 7:30 am / pm on the day of your procedure. Please do not arrive before 6:30 a.m. Call the Heart and Vascular Center at [phone] if you are unable to make your appointmnet. The Code to get into the parking garage under the building is 0300. Take the elevators to the 1st floor. You must have someone to drive you home. Someone must be with you for the first 24 hours after you arrive home. Please wear clothes that are easy to get on and off and wear slip-on shoes. Do not eat or drink after midnight except water with your medications that morning. Bring all your medications and current insurance cards with you.  _X__ DO NOT take these medications before your procedure:   Stop Coumadin 2/6,   Do Not take Actos AM of cath.    ___ Make sure you take your aspirin.  ___ You may take ALL of your medications with water that morning. ________________________________________________________________________________________________________________________________  ___ DO NOT take ANY medications before your procedure.  ___ Pre-med instructions:  ________________________________________________________________________________________________________________________________  The usual length of stay after your procedure is 2 to 3 hours. This can vary.  If you have any questions, please call the office at the number listed above.   [name], RN  Appended Document: Cardiac Catheterization Instructions- [facility] pt aware via  phone

## 2010-04-16 NOTE — Progress Notes (Signed)
Summary: sch cath   Phone Note Other Incoming   Summary of Call: Dr Gala Romney spoke w/pt reagrding chest pain, pt needs cath, sch for 2/10 at 8:30, will call pt w/time and instructions Initial call taken by: [name], RN,  April 06, 2010 1:06 PM  New Problems: CHEST PAIN (ICD-786.50)   New Problems: CHEST PAIN (ICD-786.50)

## 2010-04-20 DIAGNOSIS — I4891 Unspecified atrial fibrillation: Secondary | ICD-10-CM

## 2010-04-22 NOTE — Procedures (Signed)
NAMEALANN, AVEY              ACCOUNT NO.:  [id number]  MEDICAL RECORD NO.:  [id number]           PATIENT TYPE:  LOCATION:                                 FACILITY:  PHYSICIAN:  Bevelyn Buckles. 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  He is status post stenting of his left circumflex by [name].  He also has a history of microvascular angina.  He just got back from a skiing trip and notes that his angina has been slightly worse and more consistence. We decided to proceed with repeat catheterization to look at his coronaries and also further evaluate his aortic stenosis.  PROCEDURES PERFORMED: 1. Selective coronary angiography. 2. Left heart cath. 3. Left ventriculogram.  DESCRIPTION OF PROCEDURE:  The risks and indications were explained. Consent was signed and placed on the chart.  A 4-French arterial sheath was placed in the right femoral artery using a modified Seldinger technique.  We used JL-6, 3-D RCA and angled pigtail.  All catheter exchanges were made over wire.  No apparent complications.  Central aortic pressure 138/73 with a mean of 101.  LV pressure 165/14 with an EDP of 23, mean gradient across the aortic valve was 28 mmHg consistent with moderate aortic stenosis.  Left main had mild luminal plaquing, otherwise normal.  LAD was a long vessel wrapping the apex.  It gave off several small diagonals.  In the very proximal LAD there was a 40% tubular stenosis. In mid LAD there was a tubular 30% stenosis spanning the septal perforator.  The septal perforator had a chronic 99% subtotal occlusion ostially.  This was completely stable.  Left circumflex gave off a large branching ramus and a posterolateral. In the mid circumflex,  there was a long area of stenting.  Prior to the stent, there was about 30% stenosis.  The stent was itself was widely patent.  Right coronary artery was a dominant vessel gave off an RV branch of PDA and a posterolateral.  There is diffuse 40% plaquing in the proximal section and tubular 50% stenosis in the midsection and 20-30% stenosis distally near the takeoff of the PDA.  This was chronic.  Left ventriculogram done in the RAO position showed an EF of 55% with no regional wall motion abnormalities.  ASSESSMENT: 1. Stable essentially nonobstructive coronary artery disease. 2. Moderate aortic stenosis with gradient very similar to what is     found on echocardiogram. 3. Aortic root dilatation. 4. Normal left ventricular function.  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  We will continue aggressive risk factor management.  He has had some progression in his aortic stenosis over the past few years.  We will continue to watch this.  We will also continue to follow his aortic root diameter.     Bevelyn Buckles. Bensimhon, MD     DRB/MEDQ  D:  04/10/2010  T:  04/10/2010  Job:  [id number]  cc:   Gordy Savers, MD  Electronically Signed by Arvilla Meres MD on 04/22/2010 01:57:41 PM

## 2010-04-28 NOTE — Cardiovascular Report (Signed)
Summary: Pre-Cath Orders  Pre-Cath Orders   Imported By: Marylou Mccoy 04/24/2010 09:54:39  _____________________________________________________________________  External Attachment:    Type:   Image     Comment:   External Document

## 2010-05-14 ENCOUNTER — Ambulatory Visit (INDEPENDENT_AMBULATORY_CARE_PROVIDER_SITE_OTHER): Payer: Medicare Other

## 2010-05-14 DIAGNOSIS — I4891 Unspecified atrial fibrillation: Secondary | ICD-10-CM

## 2010-05-18 ENCOUNTER — Telehealth: Payer: Self-pay | Admitting: Internal Medicine

## 2010-05-22 ENCOUNTER — Encounter (INDEPENDENT_AMBULATORY_CARE_PROVIDER_SITE_OTHER): Payer: Medicare Other

## 2010-05-22 DIAGNOSIS — R002 Palpitations: Secondary | ICD-10-CM

## 2010-05-27 ENCOUNTER — Other Ambulatory Visit (HOSPITAL_COMMUNITY): Payer: Self-pay | Admitting: Internal Medicine

## 2010-05-27 DIAGNOSIS — I350 Nonrheumatic aortic (valve) stenosis: Secondary | ICD-10-CM

## 2010-05-28 ENCOUNTER — Ambulatory Visit (HOSPITAL_COMMUNITY): Payer: Medicare Other | Attending: Internal Medicine | Admitting: Radiology

## 2010-05-28 ENCOUNTER — Encounter: Payer: Self-pay | Admitting: Internal Medicine

## 2010-05-28 DIAGNOSIS — I359 Nonrheumatic aortic valve disorder, unspecified: Secondary | ICD-10-CM | POA: Insufficient documentation

## 2010-05-28 DIAGNOSIS — E785 Hyperlipidemia, unspecified: Secondary | ICD-10-CM | POA: Insufficient documentation

## 2010-05-28 DIAGNOSIS — I2510 Atherosclerotic heart disease of native coronary artery without angina pectoris: Secondary | ICD-10-CM | POA: Insufficient documentation

## 2010-05-28 DIAGNOSIS — I4891 Unspecified atrial fibrillation: Secondary | ICD-10-CM | POA: Insufficient documentation

## 2010-05-28 DIAGNOSIS — I350 Nonrheumatic aortic (valve) stenosis: Secondary | ICD-10-CM

## 2010-05-28 NOTE — Progress Notes (Signed)
Summary: sch echo   Phone Note Call from Patient   Summary of Call: pt due for echo in March to eval AS, he called today and sch echo for 3/30 at 1pm  Initial call taken by: [name], RN,  May 18, 2010 10:24 AM

## 2010-05-29 ENCOUNTER — Encounter: Payer: Self-pay | Admitting: Internal Medicine

## 2010-05-29 ENCOUNTER — Other Ambulatory Visit (HOSPITAL_COMMUNITY): Payer: Medicare Other | Admitting: Radiology

## 2010-05-29 ENCOUNTER — Ambulatory Visit (INDEPENDENT_AMBULATORY_CARE_PROVIDER_SITE_OTHER): Payer: Medicare Other | Admitting: Internal Medicine

## 2010-05-29 DIAGNOSIS — I359 Nonrheumatic aortic valve disorder, unspecified: Secondary | ICD-10-CM

## 2010-05-29 DIAGNOSIS — I4891 Unspecified atrial fibrillation: Secondary | ICD-10-CM

## 2010-05-29 DIAGNOSIS — I495 Sick sinus syndrome: Secondary | ICD-10-CM

## 2010-05-29 DIAGNOSIS — I2510 Atherosclerotic heart disease of native coronary artery without angina pectoris: Secondary | ICD-10-CM

## 2010-05-29 NOTE — Patient Instructions (Signed)
Your physician wants you to follow-up in: 6 months with Dr. Allred. You will receive a reminder letter in the mail two months in advance. If you don't receive a letter, please call our office to schedule the follow-up appointment.  

## 2010-05-29 NOTE — Assessment & Plan Note (Signed)
The patient has at least moderate aortic stenosis by cath and echo.  His peak and mean gradient by recent echo are 56 and 30 respectively.  His EF is preserved.  By exam, his AS is moderate.

## 2010-05-29 NOTE — Assessment & Plan Note (Signed)
I have reviewed the patients recent holter monitor and discussed its results with Dr [name].  The monitor reveals frequent PACs and PVCs with short episodes of atrial tachycardia.  The patient feels that his symptoms are much improved with recently increased amiodarone.  He will return to amiodarone 200mg  daily after a 2 week period.  Continue lifelong coumadin for stroke prevention. We will need to follow TFTs and LFTs longterm.

## 2010-05-29 NOTE — Assessment & Plan Note (Signed)
Recent cath reviewed.  Medical therapy advised No changes today

## 2010-05-29 NOTE — Progress Notes (Signed)
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  He has a Guidant PPM.  His first pacemaker was implanted 1991.  His most recent generator change was by Dr [name] in 2008.  He has a long AV delay and is therefore programmed AAI due to intolerance of ventricular pacing.  He states that over the past few months that his exercise tolerance, angina, and SOB have worsened.  He was evaluated by Dr Gala Romney which revealed stable coronary artery disease with moderate aortic stenosis for which medical therapy was advised.  He reports frequent palpitations over the past few months for which he wore a 48 hour holter 05/22/10.  This monitor was evaluated by Dr [name] and was felt to show episodes of atrial tachycardia with frequent PACs and PVCs.  He was instructed to increase amiodarone to 400mg  daily for two weeks.  With increased amiodarone, he reports feeling "much better".  His palpitations and exercise tolerance have improved.    He denies symptoms of orthopnea, PND, lower extremity edema, dizziness, presyncope, syncope, or neurologic sequela.  The patient feels that he is tolerating medications without difficulties and is otherwise without complaint today.   Past Medical History  Diagnosis Date  . Paroxysmal atrial fibrillation   . Chronotropic incompetence with sinus node dysfunction     Status post Guidant dual-mode, dual-pacing, dual-sensing  pacemaker   implantation now programmed to AAI with recent generator change.  . Coronary artery disease     status post multiple prior percutaneous coronary interventions, microvascular angina per Dr [name]  . Aortic stenosis     moderate aortic stenosis  . Hyperlipidemia   . Hypercoagulable state     chronically anticoagulated with coumadin   Past Surgical History  Procedure Date  . Pacemaker insertion 1991    Guidant PPM, most recent Generator Change by Dr [name] was 08/22/06    Current outpatient prescriptions:amiodarone (PACERONE) 200  MG tablet, 400 mg. 2 tabs daily, Disp: , Rfl: ;  aspirin 81 MG tablet, Take 81 mg by mouth daily.  , Disp: , Rfl: ;  co-enzyme Q-10 30 MG capsule, Take 30 mg by mouth daily.  , Disp: , Rfl: ;  hydrochlorothiazide 25 MG tablet, Take 25 mg by mouth. 1/2 po prn , Disp: , Rfl: ;  levothyroxine (SYNTHROID, LEVOTHROID) 75 MCG tablet, Take 75 mcg by mouth daily.  , Disp: , Rfl:  metoprolol succinate (TOPROL-XL) 25 MG 24 hr tablet, Take 50 mg by mouth daily. , Disp: , Rfl: ;  potassium chloride (K-DUR,KLOR-CON) 10 MEQ tablet, Take 10 mEq by mouth 2 (two) times daily.  , Disp: , Rfl: ;  rosuvastatin (CRESTOR) 40 MG tablet, Take 20 mg by mouth daily. , Disp: , Rfl: ;  Silodosin (RAPAFLO PO), Take by mouth daily.  , Disp: , Rfl: ;  warfarin (COUMADIN) 4 MG tablet, Take by mouth as directed.  , Disp: , Rfl:  DISCONTD: pioglitazone (ACTOS) 30 MG tablet, Take 30 mg by mouth daily.  , Disp: , Rfl:   History   Social History  . Marital Status: Married    Spouse Name: N/A    Number of Children: N/A  . Years of Education: N/A   Occupational History  . Not on file.   Social History Main Topics  . Smoking status: Never Smoker   . Smokeless tobacco: Not on file  . Alcohol Use: No  . Drug Use: No  . Sexually Active: Not on file   Other Topics Concern  .  Not on file   Social History Narrative  . No narrative on file    No family history on file.  Physical Exam: Filed Vitals:   05/29/10 1405  BP: 128/82  Pulse: 72  Height: 6\' 2"  (1.88 m)  Weight: 220 lb (99.791 kg)    GEN- The patient is well appearing, alert and oriented x 3 today.   Head- normocephalic, atraumatic Eyes-  Sclera clear, conjunctiva pink Ears- hearing intact Oropharynx- clear Neck- supple, no JVP Lymph- no cervical lymphadenopathy Lungs- Clear to ausculation bilaterally, normal work of breathing Chest- pacemaker pocket is well healed Heart- Regular rate and rhythm with frequent ectopy, 2/6 SEM LUSB with audible A2, PMI not  laterally displaced GI- soft, NT, ND, + BS Extremities- no clubbing, cyanosis, or edema MS- no significant deformity or atrophy Skin- no rash or lesion Psych- euthymic mood, full affect Neuro- strength and sensation are intact

## 2010-05-29 NOTE — Assessment & Plan Note (Signed)
Normal pacemaker function.  He has been chronically programmed AAI due to prolonged AV delay and intolerance of V pacing. No changes today. Return in 6 months for device follow-up.

## 2010-06-09 ENCOUNTER — Ambulatory Visit: Payer: Medicare Other | Admitting: Cardiology

## 2010-06-16 ENCOUNTER — Ambulatory Visit (INDEPENDENT_AMBULATORY_CARE_PROVIDER_SITE_OTHER): Payer: Medicare Other | Admitting: Cardiology

## 2010-06-16 DIAGNOSIS — I359 Nonrheumatic aortic valve disorder, unspecified: Secondary | ICD-10-CM

## 2010-06-16 DIAGNOSIS — I2510 Atherosclerotic heart disease of native coronary artery without angina pectoris: Secondary | ICD-10-CM

## 2010-06-16 DIAGNOSIS — D6859 Other primary thrombophilia: Secondary | ICD-10-CM

## 2010-06-16 DIAGNOSIS — I4891 Unspecified atrial fibrillation: Secondary | ICD-10-CM

## 2010-06-16 NOTE — Patient Instructions (Signed)
Your physician wants you to follow-up in: September 2012 with Dr Riley Kill.  You will receive a reminder letter in the mail two months in advance. If you don't receive a letter, please call our office to schedule the follow-up appointment.  Your physician has requested that you have an echocardiogram in September 2012. Echocardiography is a painless test that uses sound waves to create images of your heart. It provides your doctor with information about the size and shape of your heart and how well your heart's chambers and valves are working. This procedure takes approximately one hour. There are no restrictions for this procedure.

## 2010-06-26 NOTE — Assessment & Plan Note (Signed)
On amio and followed by Dr. [name].

## 2010-06-26 NOTE — Progress Notes (Signed)
HPI:  [name] comes in for follow up.  He has been followed by Bruce most recently, and then underwent cath by [facility] which demonstrated patent arteries.  He has not had CABG.  Brother [name], his identical twin, just underwent AVR after randomization in the Core Valve trial.  [name] has noticed some change, and is here today, and we are reviewing his course.  He also had a monitor, and it shows a variety of findings including what looks like atrial tach, some bradycardia, ?atrial fib, PVCs.  His amiodarone has recently been increased by Dr. [name].  Current Outpatient Prescriptions  Medication Sig Dispense Refill  . amiodarone (PACERONE) 200 MG tablet 400 mg. 2 tabs daily      . aspirin 81 MG tablet Take 81 mg by mouth daily.        Marland Kitchen co-enzyme Q-10 30 MG capsule Take 30 mg by mouth daily.        . hydrochlorothiazide 25 MG tablet Take 25 mg by mouth daily.       Marland Kitchen levothyroxine (SYNTHROID, LEVOTHROID) 75 MCG tablet Take 75 mcg by mouth daily.        . metoprolol succinate (TOPROL-XL) 25 MG 24 hr tablet Take 50 mg by mouth daily.       . potassium chloride (K-DUR,KLOR-CON) 10 MEQ tablet Take 10 mEq by mouth 2 (two) times daily.        . rosuvastatin (CRESTOR) 40 MG tablet Take 20 mg by mouth daily.       . Silodosin (RAPAFLO PO) Take by mouth daily.        Marland Kitchen warfarin (COUMADIN) 4 MG tablet Take by mouth as directed.          No Known Allergies  Past Medical History  Diagnosis Date  . Paroxysmal atrial fibrillation   . Chronotropic incompetence with sinus node dysfunction     Status post Guidant dual-mode, dual-pacing, dual-sensing  pacemaker   implantation now programmed to AAI with recent generator change.  . Coronary artery disease     status post multiple prior percutaneous coronary interventions, microvascular angina per Dr [name]  . Aortic stenosis     moderate aortic stenosis  . Hyperlipidemia   . Hypercoagulable state     chronically anticoagulated with coumadin    Past Surgical History    Procedure Date  . Pacemaker insertion 1991    Guidant PPM, most recent Generator Change by Dr [name] was 08/22/06    No family history on file.  History   Social History  . Marital Status: Married    Spouse Name: N/A    Number of Children: N/A  . Years of Education: N/A   Occupational History  . Not on file.   Social History Main Topics  . Smoking status: Never Smoker   . Smokeless tobacco: Not on file  . Alcohol Use: No  . Drug Use: No  . Sexually Active: Not on file   Other Topics Concern  . Not on file   Social History Narrative  . No narrative on file    ROS: Please see the HPI.  All other systems reviewed and negative.  PHYSICAL EXAM:  There were no vitals taken for this visit.  General: Well developed, well nourished, in no acute distress. Head:  Normocephalic and atraumatic. Neck: no JVD Lungs: Clear to auscultation and percussion. Heart: Normal S1 and S2.  S4.  SEM, mid peaking.   Abdomen:  Normal bowel sounds; soft; non tender; no organomegaly  Pulses: Pulses normal in all 4 extremities. Extremities: No clubbing or cyanosis. No edema. Neurologic: Alert and oriented x 3.  EKG: Atrial pacing, RBBB.   CARDIAC CATH 1. Stable essentially nonobstructive coronary artery disease.   2. Moderate aortic stenosis with gradient very similar to what is       found on echocardiogram.   3. Aortic root dilatation.   4. Normal left ventricular function.      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  We will continue aggressive risk factor management.  He has   had some progression in his aortic stenosis over the past few years.  We   will continue to watch this.  We will also continue to follow his aortic   root diameter.   ECHO: Study Conclusions    - Left ventricle: The cavity size was normal. Wall thickness was     increased in a pattern of mild LVH. Systolic function was normal.     The estimated ejection  fraction was in the range of 60% to 65%.     Doppler parameters are consistent with abnormal left ventricular     relaxation (grade 1 diastolic dysfunction).   - Aortic valve: AV is thickened, calcified with restricted motion.     Leaflets are difficult to see well. Peak and mean gradients     through the valve are50 and 26 mm Hg respectively consistent with     moderate AS. Mild regurgitation.   - Left atrium: The atrium was moderately dilated.       Cath:  Apr 14, 2010  ASSESSMENT AND PLAN:

## 2010-06-26 NOTE — Assessment & Plan Note (Signed)
Likely will come to replacement in the next few years.  I tried to review with him some of the possibilities.  He might be a TAVR candidate at the time this becomes necessary.  We are clued into this.

## 2010-06-26 NOTE — Assessment & Plan Note (Signed)
Identified issue.  Remains on warfarin.

## 2010-06-26 NOTE — Assessment & Plan Note (Signed)
See cath report information.

## 2010-07-14 NOTE — Assessment & Plan Note (Signed)
[facility]                            CARDIOLOGY OFFICE NOTE   NAME:LEBAUERMustaf, [name]                     MRN:          [id number]  DATE:07/12/2006                            DOB:          October 27, 1934    CLINICAL HISTORY:  [name] is 76 years old and returns for management of  his atrial fibrillation and coronary disease.  [name] has a Guidant-DD  pacemaker implanted.  About four weeks ago he went into atrial  fibrillation.  We started him on amiodarone and Coumadin at that time.  The amiodarone has helped her symptoms a great deal, and he has only  have occasional palpitations.  He does get a feeling of a pounding of  his heart with exercise as well.  When he exercises he also has had some  chest discomfort which is what he is associated with as his  microvascular angina.   He also has had coronary disease and has had previous stents placed in  the circumflex artery, but had a catheterization done recently which  showed only nonobstructive disease except for a lesion in the septal  perforator.  His LV function has been good.   PAST MEDICAL HISTORY:  Significant for the problems outlined below.   CURRENT MEDICATIONS INCLUDE:  Flomax, fish oil, multivitamin, Toprol,  aspirin, CoQ10, Lipitor, Protonix, amiodarone, and Coumadin.   PHYSICAL EXAMINATION:  On examination today the blood pressure is  130/82; the pulse was 60 and regular.  Venous pulses could be visible about 1 cm above the clavicle.  The  carotid pulses were full without bruits.  CHEST:  Clear without rales or rhonchi.  The cardiac rhythm was regular.  There was a 2/6 systolic ejection  murmur at the left sternal edge.  ABDOMEN:  Soft, normal bowel sounds.  There was trace peripheral edema.   We evaluated the pacemaker.  His resting electrocardiogram showed AV  pacing with AAI pacing his QT was about 470.  His AV delay measured with  a pacemaker with AAI pacing was 360, and on the ECG it  appeared to be  about 300.  He had no diaphragmatic stimulation on the ventricular lead  at high outputs.  We did this to see if his sensitivity was possibly  related to subclinical diaphragmatic stimulation.  Interrogation of his  pacemaker indicated that he was pacing both chambers most of the time;  and yet he did not have any sensation of feeling bad with ventricular  pacing.  It is possible that these beats were fused.   We programmed him down his rate response in the hopes that this will  help his palpitations.  We also put him back to DVD so that we can  assess mode switches.  Report his mode switch rate down to 140 so that  if he has SVT it will mode switch for this as well.  We left his  programmed AV delay at 300.   IMPRESSION:  1. Paroxysmal atrial fibrillation now improved on amiodarone.  2. It is post Guidant DVD pacemaker with good pacing function.  3. Coronary artery disease previous  drug-eluting stents to the      circumflex artery with nonobstructive disease that last      catheterization.  4. Microvascular angina.  5. Good LV function.  6. Hyperlipidemia.  7. History of hypercoagulable state.  8. History of posterior circulation transient ischemic attack.   RECOMMENDATIONS:  [name] is nearing ERI for his pacemaker; and we will  make plans to replace that in about four to five weeks.  We will have  him come in the week before to reinterrogate his pacemaker and assess  how much atrial fibrillation he has and decided we should cut back on  his amiodarone.  We will get amiodarone surveillance drugs with his labs  for his pacemaker including liver profile TSH and lipid profile.  When  we put in a new generator we may want to put in a generator which will  allow longer of AV delays in 300, and Joe from [employer] is looking into  their availability to provide this.  He may need a temporary pacemaker  since he has become pacer dependent.  We will cut back his Toprol from   200 to 100 a day; and we will put him back on hydrochlorothiazide 25 a  day since he has had some slight fluid retention with the resumption of  Actos.     Bruce Elvera Lennox [name], MD, [facility]     BRB/MedQ  DD: 07/12/2006  DT: 07/13/2006  Job #: [id number]

## 2010-07-14 NOTE — Procedures (Signed)
[name]                              EXERCISE TREADMILL   NAME:[name], [name]                     MRN:          [id number]  DATE:02/14/2007                            DOB:          November 01, 1934    INDICATIONS FOR PROCEDURE:  We performed a treadmill test on [name], to  evaluate his rate response to exercise, and also to evaluate his AV  conductions, to help with decisions regarding re-programming his  pacemaker.  He has been having quite a bit of symptoms of palpitations,  which he interprets as atrial fibrillation.   DESCRIPTION OF PROCEDURE:  [name] was able to exercise for nine minutes of  a modified Bruce protocol, and achieved a heart rate of 115, at which  time the test was terminated.  The resting electrocardiogram showed an  atrial paced rhythm with a right bundle branch block and frequent APCs.  During exercise, it appeared that he was atrial pacing with some APCs  and then with intrinsic conduction, until he got up to a rate of about  100, at which time he had his own instrinsic atrial rates.  During the  last phase of exercise, his rates got up to 100 to 115, and he had  occasional paced beats which appeared to be related to non-conducted  PACs with a long AV delay.  He also had a short run of what appeared to  be pacemaker-mediated tachycardia.  The post-exercise electrocardiogram  showed a 1.0 to 1.5 mm ST depression in the inferior lateral leads.  We  re-programmed his P-var up to 350 and we still documented one short  episode of pacemaker-mediated tachycardia following a non-captured  atrial paced beat.   CONCLUSION:  Exercise electrocardiogram showing moderately good exercise  tolerance with frequent atrial premature contractions and one short run  of pacemaker-mediated tachycardia with 1.5 mm ST depression in the  inferior lateral leads post-exercise.   RECOMMENDATIONS:  I reviewed the tracings with Dr. Doylene Canning. [name] and  we  made several decisions regarding his management.  We had interrogated  his pacemaker, and he had only less than one minute total of atrial  fibrillation, so this does not appear to be an issue.  He did have  frequent atrial premature contractions, and these are symptomatic.  Based on this, we increased his amiodarone from 100 mg to 200 mg daily.  He had actually started this several days ago.  Because of the pacemaker-  mediated tachycardia, we increased his P-var out to 360.  With his  atrioventricular delay set at 400, this gives an upper maximum tracking  rate of 80, but since he has intrinsic conduction with exercise, this  does not appear to be an issue.  We cut his resting rate back to 50 and  we programmed his sensor maximum rate to 100.   When he gets back from his trip to [place] and [place], which he is  leaving on March 10, 2007, we will plan to bring him in and re-program  to an AI mode.  This will eliminate the problems of ventricular pacing  and re-entrant tachycardia.  Since he had intact intrinsic conduction at  rest and through exercise, I think AI pacing should be safe.  He will  also get his  six-month amiodarone laboratory values, including liver tests, lipid  tests, TSH and beta natriuretic peptide.   FOLLOWUP:  I will see him back in late January or early February.     Bruce Kevin Lennox [name], MD, [facility]  Electronically Signed    BRB/MedQ  DD: 02/14/2007  DT: 02/15/2007  Job #: [id number]

## 2010-07-14 NOTE — Letter (Signed)
November 24, 2006     RE:  [name], [name]  MRN:  [id number]  /  DOB:  [birth year]   To whom this may concern:   [name] is having symptoms of palpitations and chest pain that have  increased in severity recently.  Because of the increase in his  symptoms, it is required that we perform a prolonged event monitor on  him.  This letter is to document the medical necessity for the event  monitor.    Sincerely,      Bruce R. [name], MD, [facility]  Electronically Signed    BRB/MedQ  DD: 11/24/2006  DT: 11/25/2006  Job #: [id number]

## 2010-07-14 NOTE — Assessment & Plan Note (Signed)
[facility]                            CARDIOLOGY OFFICE NOTE   NAME:LEBAUERNalin, Mazzocco                     MRN:          [id number]  DATE:10/26/2006                            DOB:          February 10, 1935    CLINICAL HISTORY:  [name] returns for follow-up management of his  pacemaker, atrial fibrillation, and coronary heart disease.  He has been  on several trips recently, including to the [place].  When he came  back from the [place], we had him on an event monitor, and he had  periods where he felt his heart beating differently with associated  chest pain.  On his event monitor, he was pacing during these spells,  and we thought it was probably due to paroxysmal atrial fibrillation  with pacing.  Based on this, we increased his amiodarone from 200 to 400  with plans to cut him back to 300 after a two-week load.  He has felt a  little bit better since that change.   He still has chest pain with exertion, but this has not changed in  frequency or intensity.   His past medical history is significant for multiple problems listed  below.  Including this is coronary artery disease with a drug-eluting  stent placed in the circumflex artery a number of years ago and  nonobstructive disease at last catheterization with the exception of a  high-grade stenosis and large septal perforator.  He also has mitral  valve angina.   His current medications include Flomax, fish oil, Toprol, aspirin,  Lipitor, Protonix, hydrochlorothiazide, Coumadin, Zetia, and amiodarone.   PHYSICAL EXAMINATION:  Blood pressure 136/78, pulse 50 and regular.  There was no venous distention.  Carotid pulses were full.  CHEST:  Clear without rales or rhonchi.  CARDIAC:  Heart rhythm was regular.  There is a 2/6 systolic murmur at  the left sternal edge.  I could hear no diastolic murmur.  ABDOMEN:  Soft with normal bowel sounds.  Peripheral pulses were full with no peripheral  edema.   The resting ECG showed atrial pacing with right bundle branch block and  nonspecific ST/T changes.   We interrogated the pacemaker, and we did not find any mode switches.  It does look like he had some periods where his ventricular pacing and  it appears it is related to prolongation of his AV delay.  The baseline  AV delay is about 380 and he is programmed to an AV delay of 400, so we  think he increases his AV delay and ventricular paces, and this causes  symptoms.   IMPRESSION:  1. Paroxysmal atrial fibrillation, controlled on Amiodarone.  2. Status post recent generator change with a Guidant DDD pacemaker      with good pacer function with some elevation of his atrial lead      threshold of 2.4, possibly related to amiodarone.  3. Coronary artery disease, status post overlapping drug-eluting      stents in the circumflex artery several years ago with      nonobstructive disease at last catheterization with the exception  of a __________ .  4. Mitral vascular angina.  5. History of hypercoagulable state.  6. Hyperlipidemia.  7. Good left ventricular function.  8. History of posterior circulation transient ischemic attacks.   RECOMMENDATIONS:  The atrial fib appears to be controlled on amiodarone,  and the symptoms appear to be related to pacing related to prolongation  of the AV delay.  Based on this, we will cut back the amiodarone from  400 back to 200 mg a day and will stop the Toprol.  We will plan to have  him return for a follow-up visit in three months. We will check his  atrial threshold at that time.  If his symptoms do not improve with the  above changes, he is to let us know.  He will need follow-up liver  function tests, TSH at six months for amiodarone surveillance.     Kevin Elvera Lennox [name], MD, [facility]  Electronically Signed    BRB/MedQ  DD: 10/26/2006  DT: 10/27/2006  Job #: [id number]

## 2010-07-14 NOTE — Assessment & Plan Note (Signed)
[facility]                            CARDIOLOGY OFFICE NOTE   NAME:LEBAUERParmvir, Boomer                     MRN:          [id number]  DATE:01/04/2007                            DOB:          March 02, 1934    CLINICAL HISTORY:  Gene Dils came in for an unscheduled visit today  because of increased symptoms of palpitation.  He feels like he can tell  the difference between ventricular pacing which is uncomfortable to him  and his atrial fibrillation and thought he was probably in atrial  fibrillation.  With listening with his stethoscope, he noticed some  irregular beats.  He has had no increase in his chest pain.   PAST MEDICAL HISTORY:  Significant for the problems outlined below.   CURRENT MEDICATIONS:  1. Flomax.  2. Fish oil.  3. Aspirin.  4. Protonix.  5. Hydrochlorothiazide.  6. Coumadin.  7. Zetia.  8. Amiodarone.  We talked two days ago, and he increased his      amiodarone from 100-300 a day, thinking he had increased atrial      fibrillation.  9. Lipitor.   PHYSICAL EXAMINATION:  VITAL SIGNS:  Blood pressure 150/84, there was no  venous distention.  Carotid pulses were full without bruits.  CHEST:  Clear without rales or rhonchi.  CARDIAC:  Rhythm was regular.  I could see no murmurs or gallops.  ABDOMEN:  Soft, normal bowel sounds.   ASSESSMENT:  We interrogated the pacemaker and there were no mode  switches, no absence of atrial fibrillation.  His program to mode switch  had a rate of 130 with more than 8 beats.  We found that his frequency  of ventricular paced beats increased from about 15% to 7%.  We presumed  this was related to his AV delay.  Stressing AV delay was shorter on  this interrogation at the lower dose of Amiodarone at about 320  milliseconds.  We also noted that his rate response was somewhat  blunted.   IMPRESSION:  1. Paroxysmal atrial fibrillation.  2. 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  3. Coronary artery disease status post prior percutaneous coronary      interventions.  4. Microvascular angina.  5. Aortic stenosis.  6. Hypercoagulable state.   RECOMMENDATIONS:  Based on the findings of interrogation with no  evidence of atrial fibrillation, we plan to cut his amiodarone back to  100 daily.  This will help decrease his frequency of ventricular pacing.  We elected not to increase his rate response as this might provoke  angina.  We will plan to have him come back for a stress test in  December which will allow [place] to assess his rate response and also see if  we can learn anything more about his AV delay and when he is ventricular  pacing.  We might consider programming the AAI at some point, but  probably wait until he returns from [place] before doing this.  He is  leaving tomorrow for [place] and then in early January will be  in [place]  for about three weeks.  We also discussed his coming off Coumadin when  he goes to [place] for about a week where he plans to ski, thinking that  the risk of an injury on Coumadin is greater than the risk of emboli off  Coumadin for a week.  He should stop his Coumadin about 3-4 days before  he goes on the trip, and he can start Coumadin back on the last day he  is there.     Bruce Elvera Lennox [name], MD, [facility]  Electronically Signed    BRB/MedQ  DD: 01/04/2007  DT: 01/05/2007  Job #: [id number]

## 2010-07-14 NOTE — Assessment & Plan Note (Signed)
[facility]                            CARDIOLOGY OFFICE NOTE   Kevin, [name]                     MRN:          [id number]  DATE:02/20/2008                            DOB:          1934-03-06    CLINICAL HISTORY:  [name] returns for followup management of his  coronary heart disease, atrial fibrillation, and pacemaker.  He has  documented coronary disease and has had previous stenting of the  circumflex artery and at last cath had a high-grade lesion of the  diagonal branch of the LAD, but nonobstructive disease elsewhere.  He  also had microvascular angina.  He also has atrial fibrillation, which  has been controlled with amiodarone.  Had a great deal of symptoms  related to his pacemaker when it was in the rate smoothing, but we cut  that off and the symptoms have mostly resolved.  We programmed the AAI  so that he would not pace his ventricle.  He is very sensitive when he  paces his ventricle.   He has been doing well recently with very little angina and very little  palpitations.   PAST MEDICAL HISTORY:  Significant for the problems outlined below.   CURRENT MEDICATIONS:  Flomax, fish oil, multivitamins, aspirin, CoQ10,  hydrochlorothiazide, Coumadin, Crestor, and amiodarone 200 mg daily.   PHYSICAL EXAMINATION:  VITAL SIGNS:  Blood pressure is 120/80, pulse 84  and regular.  NECK:  There was no venous distention.  The carotid pulses were full  with transmitted bruits from below.  CHEST:  Clear.  CARDIAC:  Rhythm was regular.  There was 2/6 systolic ejection murmur.  ABDOMEN:  Soft with normal bowel sounds.  No hepatosplenomegaly.  EXTREMITIES: Trace peripheral edema.  Pedal pulses are equal.   IMPRESSION:  1. Coronary artery disease status post prior stenting of the      circumflex artery with drug-eluting stents with coronary anatomy as      described above, now stable.  2. Microvascular angina.  3. Mild aortic  stenosis with a mean aortic valve of 13 mmHg.  4. Paroxysmal atrial fibrillation controlled on amiodarone.  5. Status post Guidant DDD pacemaker, now programmed AAI.  6. Hypercoagulable state.  7. Hyperlipidemia.  8. Degenerative disease of left shoulder.   RECOMMENDATIONS:  I think [name] is doing fairly well.  He is planning  to go on skiing and we decided to take him off Coumadin while he was  skiing feeling that the risk of injury on  Coumadin is greater than the small risk of stroke while he is off  Coumadin.  We will plan to see him back in followup in 6 months.     Bruce Elvera Lennox [name], MD, [facility]  Electronically Signed    BRB/MedQ  DD: 03/05/2008  DT: 03/06/2008  Job #: [id number]

## 2010-07-14 NOTE — Assessment & Plan Note (Signed)
[facility]                            CARDIOLOGY OFFICE NOTE   NAME:[name], [name]                     MRN:          [id number]  DATE:08/30/2006                            DOB:          Oct 24, 1934    CLINICAL HISTORY:  [name] is 76 years old and returned for management of  his atrial fibrillation and coronary heart disease following his recent  generator change.  We brought him in for a generator change because of  ERI and implanted a new Guidant DDD device which allowed an A-V delay of  up to 400.  Joe interrogated his device in the office last Friday, and  he was having sensitivity in pacing his ventricle, and he was having  intermittent retrograde V to A conduction, so we left him at AAI.  We  cut his Toprol back from 100 to 50 mg.  He said that he has felt some  better since then, but he still has had some feeling of palpitations.  He has had no chest pain and no feelings of SVT or atrial fibrillation.   PAST MEDICAL HISTORY:  Significant for the problems outlined below.   CURRENT MEDICATIONS:  Flomax, fish oil, Toprol, aspirin, Co-Q 10,  Lipitor, Protonix, amiodarone, hydrochlorothiazide, Coumadin, and Zetia.   PHYSICAL EXAMINATION:  VITAL SIGNS:  Blood pressure 120/86.  There was  no venous tension.  The carotid pulses were full without bruits.  CHEST:  Clear without rales or rhonchi.  Cardiac rhythm was regular.  He  had no murmurs or gallops.  The pacemaker incision appeared to be  healing well.  There was some mild puffiness in the pocket but no  erythema.  There was some ecchymosis below the pocket but no hematoma.  There was no peripheral edema.   We interrogated his pacemaker, and he was having intermittent fusion  with DDD pacing and an A-V delay of 400.  We were finally able to get  him to full instrinsic conduction by putting his atrial pacing rate at  50, so we set him at DDD 50.  We set his mode switch rate at 130 so it  would  detect SVT as well as atrial fibrillation and mode switch to DDI  mode.  We also found that he was having some retrograde conduction, both  when he had atrial pacing and ventricular pacing or ventricular fusion  and also occasionally when he had atrial pacing with instrinsic  conduction to his ventricle followed by retrograde P waves.  This  suggested there may be an accessory pathway.  With him programmed at DDD  at a rate of 50 and an A-V delay of 400, when we checked him last, he  had minimal retrograde conduction, and his instrinsic conduction was  intact.  We put his upper rate limit at 100 because of the long A-V  delay and PVARP requirements which would not allow [place] to have a faster  tracking rate or sensor rate.  This should not be a problem because his  rates when he was in AAI did not exceed 100.   IMPRESSION:  1. Status post recent generator change with a Guidant DDD pacer with      good pacer function but with some retrograde conduction.  2. Paroxysmal atrial fibrillation controlled on amiodarone.  3. History of supraventricular tachycardia.  4. Coronary artery disease status post prior drug-eluting overlapping      drug release stent in the circumflex artery with nonobstructive      disease at last catheterization with the exception of __________      large septal perforator.  5. Microvascular angina.  6. Hypercoagulable state.  7. Hyperlipidemia.  8. Good left ventricular function.  9. History of posterior circulation transient ischemic attack.   RECOMMENDATIONS:  I think [name] is doing well.  Hopefully the current  programming will allow him to have instrinsic arteriovenous conduction  so he will not pace his ventricle and be symptomatic from this and also  hopefully will minimize any retrograde conduction even from intrinsic  beats.  We will cut back his Toprol from 50 to 25 a day which will help  his instrinsic conduction, hopefully.  If retrograde becomes a   persistent problem and we think this is related to an accessory pathway,  then we might get an opinion about possible ablation.  I plan to see him  back the week before he leaves for the [facility] on July 20.     Bruce [name] [name], MD, [facility]  Electronically Signed    BRB/MedQ  DD: 08/30/2006  DT: 08/31/2006  Job #: [id number]

## 2010-07-14 NOTE — Assessment & Plan Note (Signed)
[facility]                            CARDIOLOGY OFFICE NOTE   NAME:LEBAUERJibril, Mcminn                     MRN:          [id number]  DATE:11/24/2006                            DOB:          [birth year]    CLINICAL HISTORY:  Gene Nicholl returned for followup management of his  atrial fibrillation and pacemaker and angina.  At his last visit, he was  having episodes where he felt like his ventricle was being paced.  Over  the last 2 weeks, he has had increased angina.  This is sometimes  associated with following his feeling of a ventricular paced rhythm.  Sometimes this occurs with exertion with the feeling of a paced rhythm,  sometimes without.  We had cut back his amiodarone from 400 to 200,  thinking his ventricular pacing was related to prolongation of his A-V  delay causing him to pace his ventricle rather than conduct through his  instrinsic system.  We also then cut back his Toprol and then  discontinued it, so now he is currently only on 200 of amiodarone today.   Please see the chart for his past medical history and his medications.   EXAMINATION:  Blood pressure is 139/88 and the pulse 55 and regular.  There was no venous distension.  Carotid pulses were full with slight  transmitted murmur from below.  CHEST:  Clear without rales or rhonchi.  The cardiac rhythm was regular.  There is a 2/6 systolic ejection murmur.  There is no peripheral edema, and pedal pulses were equal.   We interrogated the pacemaker, and his rate response was quite blunted,  indicating decreased activity which he concurred through his history.  He was having ventricular pacing about 15% of the time which was up from  about 1% of the time.  This generally occurred at a rate of about 70.   IMPRESSION:  1. Symptomatic ventricular pacing and angina.  2. Microvascular angina.  3. Coronary artery disease status post prior percutaneous coronary      intervention.  4.  Status post DDD pacemaker implantation with recent generator      change.  5. Paroxysmal atrial fibrillation, on amiodarone.  6. Aortic stenosis.   RECOMMENDATIONS:  I still think it is most likely that Gene's  ventricular pacing is related to prolongation of his arteriovenous  delay.  We will cut back his amiodarone from 200 to 100 a day.  He has  not had any atrial fibrillation.  We will reinterrogate his pacemaker in  about 4 weeks.  He is also to continue his event monitor, and we will  see if we can document for certain whether the ventricular pacing is  related to prolongation of his arteriovenous delay.  If we are not able  to tell from this, we may consider treadmill testing.  He is to get an  echocardiogram of  his heart to reevaluate his aortic stenosis and carotid Dopplers to  evaluate for carotid stenosis tomorrow.  We will see him back in 4  months.     Bruce Elvera Lennox [name], MD, [facility]  Electronically  Signed    BRB/MedQ  DD: 11/24/2006  DT: 11/25/2006  Job #: [id number]

## 2010-07-14 NOTE — Discharge Summary (Signed)
NAMEPETRO, [name]              ACCOUNT NO.:  [id number]   MEDICAL RECORD NO.:  [id number]          PATIENT TYPE:  OIB   LOCATION:  2855                         FACILITY:  [facility]   PHYSICIAN:  Everardo Beals. [name], MD, FACCDATE OF BIRTH:  [birth year]   DATE OF ADMISSION:  08/22/2006  DATE OF DISCHARGE:  08/22/2006                               DISCHARGE SUMMARY   ADDENDUM:  The addendum concerns the device which was implanted August 22, 2006 to replace his existing Guidant device.  The explanted device was a  [employer] II implanted April 15, 2000.  The new device is a  Herbalist 60 dual-chamber pacemaker with brady parameters  60/110 and mode DDDR.      Maple Mirza, PA      Bruce R. [name], MD, [facility]  Electronically Signed    GM/MEDQ  D:  08/22/2006  T:  08/22/2006  Job:  [id number] [id number]

## 2010-07-14 NOTE — Assessment & Plan Note (Signed)
[facility]                            CARDIOLOGY OFFICE NOTE   LATHEN, SEAL                     MRN:          [id number]  DATE:09/12/2007                            DOB:          1934/06/23    CLINICAL HISTORY:  [name] returned for a followup visit because of  increasing symptoms of palpitations.  He says he feels like his heart is  beating fast and it beats in the 80s and feels fairly regular, but  perhaps slightly irregular.  He says, he has some associated dizziness  with this.  He says this occurs every day and seems to occur much of the  time.  He wonders if it was atrial fibrillation.   He has documented atrial fibrillation and he has a DDD pacemaker, which  is now programmed to AAI.  He is on amiodarone for treatment of  paroxysmal atrial fibrillation.   He also has coronary heart disease and he has had previous stents in the  circumflex artery and was studied in March 2008, at which time he had  nonobstructive disease with the exception of a high-grade lesion in the  large septal perforator.   He also has moderate aortic stenosis with a mean aortic valve gradient  of 13 mmHg.   PAST MEDICAL HISTORY:  Significant for the other problems outlined  below.   CURRENT MEDICATIONS:  1. Flomax.  2. Fish oil.  3. Multivitamins.  4. Aspirin.  5. Hydrochlorothiazide 25 mg a day.  6. Coumadin.  7. Crestor.  8. Amiodarone, taking 200 mg of amiodarone a day.  9. Toprol 50 mg a day.   PHYSICAL EXAMINATION:  VITAL SIGNS:  Blood pressure is 120/80 and pulse  50 and regular.  NECK:  There was no venous distension.  The carotid pulses were full.  CHEST:  Clear without rales or rhonchi.  HEART:  Rhythm is regular, there is 2/6 systolic ejection murmur.  EXTREMITIES:  There is no peripheral edema.  Pedal pulses were equal.   We interrogated his pacemaker and while he was having some symptoms we  documented that he had atrial bigeminy.   This gave a rate of about 80,  which was only very slightly irregular.   IMPRESSION:  1. Recent palpitations apparently related to intermittent atrial      bigeminy.  2. Paroxysmal atrial fibrillation, now controlled on amiodarone.  3. Status post Guidant dual-mode, dual-pacing, dual-sensing  pacemaker      implantation now programmed to AAI with recent generator change.  4. Coronary artery disease status post multiple prior percutaneous      coronary interventions with coronary anatomy as described above.  5. Microvascular angina.  6. Mild aortic stenosis with aortic valve gradient of 13 mmHg.  7. Hypercoagulable state.  8. Hyperlipidemia.  9. Degenerative disease, left shoulder.   RECOMMENDATIONS:  The treatment of the bigeminy may be somewhat  difficult.  We increased his pacer rate from 50-60 in hopes that this  overdrive may decrease the frequency of atrial bigeminy.  His potassium  had been low in the past.  He has been  on hydrochlorothiazide without  potassium placement, so he is started on KCl 20 mEq a day and we will  check labs including potassium next week as well as amiodarone  surveillance.  If this does not help and then we will increase his  Toprol from 50-75 a day.  If this does not help we will consider  increasing amiodarone 200-300 mg.   ADDENDUM:  I reviewed the electrograms with Dr. Graciela Husbands because the atrial  escape interval for the next atrial pace beat was lower than the  programmed rate of 50.  Brett Canales felt this was likely related to a rate-  smoothing algorithm of the pacemaker and we called Guidant and the  pacemaker indeed does have this mode.  Sometimes this may cause an  increase in symptoms because it paces at faster rates, so if [name] is not  better with pacing at a rate of 60 and correcting his potassium, then we  may want to have him come in to turn off the rate-smoothing mode which  might make him less symptomatic.     Bruce Elvera Lennox [name], MD, [facility]   Electronically Signed    BRB/MedQ  DD: 09/12/2007  DT: 09/13/2007  Job #: [id number]

## 2010-07-14 NOTE — Assessment & Plan Note (Signed)
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  He has sick  sinus syndrome with paroxysmal atrial fibrillation, which is managed  with amiodarone.  He recently had a generator change for his Guidant DDD  pacemaker.  He was quite sensitive to ventricular pacing, was have quite  a bit of symptoms, probably mostly related to ventricular pacing.  We  programmed to AAI last visit, and this has helped a great deal.   He also has coronary disease and has had previous drug-eluting stents  placed in the circumflex artery and has had a IVUS of the LAD.   He says he has had only occasional palpitations, and he has had minimal  chest pain.  He does have some shortness of breath with exertion.  This  had not changed.   PAST MEDICAL HISTORY:  His past medical history is also significant for  a mild aortic stenosis.  He has microvascular angina.  He also has a  hypercoagulable state, hyperlipidemia and degenerative disease of the  shoulder.   CURRENT MEDICATIONS:  Flomax, fish oil, aspirin, Protonix,  hydrochlorothiazide 25 mg a day, Coumadin, Crestor 20 mg four times a  week,  amiodarone 10 mg a day, Norvasc 5 mg a day which he had not been  taking regularly, Ativan and Actose 45 mg daily.   EXAMINATION:  VITAL SIGNS:  On examination, blood pressure 140/90 (He  indicates that it ranges from 150/90 to 110/20).  The pulse was 80 and  regular.  There was no venous tension.  The carotid pulses were full  without bruits.  CHEST:  Was clear.  The cardiac rhythm was regular.  There is a grade at  2/6 systolic ejection murmur at the left sternal rate radiating  to the  base.  There  is a very short diastolic murmur.  ABDOMEN:  Soft with normal bowel sounds.  There is no peripheral edema  and pedal pulses are equal.   We interrogated his pacemaker, and he had no mode switches.  He has  programmed AAI, and there was no evidence of SVT evidenced by high  intrinsic rate on the atrium.  His rate response was somewhat blunted.   IMPRESSION:  1. Paroxysmal atrial fibrillation now controlled on amiodarone.  2. Status post Guidant DDD pacemaker with recent generator change, now      programmed to a AAI.  3. History of supraventricular tachycardia.  4. Coronary artery disease status post prior percutaneous coronary      interventions as described above.  5. Good LV function.  6. Microvascular angina.  7. Mild to moderate aortic stenosis.  8. Hypercoagulable state.  9. Hyperlipidemia.  10.Degenerative disease of the shoulder.  11.Hypertension.   RECOMMENDATIONS:  I think Gene is doing well.  I think the AAI  mode is  much better for him, since he is so symptomatic with ventricular pacing.  His blood pressure has been up, so we discussed and agreed to have him  start lisinopril 5 mg a day and take his Norvasc 5 mg a day regularly.  He will continue on 25 mg of Toprol.  We will plan to see him back in  and 3 to 4 months.     Bruce Elvera Lennox [name], MD, [facility]  Electronically Signed    BRB/MedQ  DD: 06/13/2007  DT: 06/13/2007  Job #: [id number]

## 2010-07-14 NOTE — Discharge Summary (Signed)
[name], [name]              ACCOUNT NO.:  [id number]   MEDICAL RECORD NO.:  [id number]          PATIENT TYPE:  OIB   LOCATION:  2855                         FACILITY:  [facility]   PHYSICIAN:  Everardo Beals. [name], MD, FACCDATE OF BIRTH:  April 10, 1934   DATE OF ADMISSION:  08/22/2006  DATE OF DISCHARGE:                               DISCHARGE SUMMARY   FINAL DIAGNOSIS:  Guidant pacemaker at end-of-life.   SECONDARY DIAGNOSES:  1. Pacemaker implanted for sick sinus syndrome.  2. Recent history of paroxysmal atrial fibrillation, quiescent on      amiodarone.  3. History of coronary artery disease status post stents to the left      circumflex.  4. Microvascular angina.  5. Preserved left ventricular function.  6. Dyslipidemia.  7. Hypercoagulable state.  8. History of posterior circulation transient ischemic attack.   PROCEDURE:  August 22, 2006, explantation of existing Guidant device with  implantation of a new Guidant device with AV delay greater than 300  milliseconds allowing for intrinsic AV conduction.  I will provide the  name of the device in a subsequent dictation.  The patient has tolerated  the procedure well.  He is cautioned not to get the incision wet for  next 7 days.   He is on an antibiotic, Keflex 500 mg one tablet one-half hour before  breakfast, lunch, dinner, bedtime.  He continues his regular  medications:  1. Flomax 0.4 mg daily.  2. Toprol-XL 100 mg daily.  3. Enteric-coated aspirin 81 mg daily.  4. Lipitor 80 liters daily at bedtime.  5. Protonix 40 mg daily.  6. Amiodarone 200 mg daily.  7. Coumadin resuming 4 mg daily.  8. Zetia 10 mg daily.  9. Hydrochlorothiazide 25 mg daily.  10.Ativan 1 mg as needed.   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  Interrogation of the  pacemaker shows that it is not quite at end-of-life, but it is thought  that a pacemaker which allows greater AV delay will help this patient.  We are scheduling replacement  with a Guidant generator with AV delay out  to 400 milliseconds.  The patient continues on amiodarone 200 mg daily  for his atrial fibrillation.   He had been scheduled for cardioversion earlier.  However, he converted  spontaneously to sinus rhythm on amiodarone and this was not necessary.  He has followup with Dr. Charlies Constable on August 30, 2006, at 4:30 p.m.      Maple Mirza, PA      Bruce R. [name], MD, [facility]  Electronically Signed    GM/MEDQ  D:  08/22/2006  T:  08/22/2006  Job:  [id number]

## 2010-07-14 NOTE — Assessment & Plan Note (Signed)
[facility]                            CARDIOLOGY OFFICE NOTE   NAME:[name], [name]                     MRN:          [id number]  DATE:04/10/2007                            DOB:          May 29, 1934    CLINICAL HISTORY:  [name] returned for follow-up management of his  coronary heart disease, microvascular angina, atrial fibrillation and  pacemaker.  He recently returned from a skiing trip in [place].  He  skied for six days and did quite well with no problems.  He did go off  his Coumadin for a short time while he was skiing since we felt the risk  of staying on it outweighed the risk of stopping.  He says that he has  daily short-lived episodes where he feels his heart beating abnormal.  He is not certain if this is ventricular pacing or atrial tachycardia  which are the two things that have caused him symptomatic palpitations.  He has had very little in the way of microvascular angina.   PAST MEDICAL HISTORY:  Significant for multiple problems as outlined  below.   CURRENT MEDICATIONS:  Include Flomax, fish oil, multivitamins, aspirin,  Co-Q 10, Protonix, hydrochlorothiazide 25 mg daily, Coumadin, Crestor 20  mg, amiodarone 200 mg daily.   PHYSICAL EXAMINATION:  VITAL SIGNS:  Blood pressure was 140/80.  NECK:  There was no venous distention.  The carotid pulses were full and  there were transmitted bruits from below.  CHEST:  Clear without rales or rhonchi.  CARDIAC:  Rhythm was regular.  There was grade 2/6 systolic ejection  murmur at the left sternal edge radiating to the base.  I could hear no  diastolic murmur.  ABDOMEN:  Soft with normal bowel sounds.  EXTREMITIES:  Peripheral pulses full with no peripheral edema.   We interrogated his pacemaker and he had reasonable thresholds on both  leads.  He was 81% atrial paced and  only 1% ventricular paced which was  down from 7%.  He did have more PACs than PVCs than on his previous  interrogation.  His AV delay is programmed at 400 and we increased his  PVARB last time after an exercise test when he had some pacemaker  mediated tachycardia.  He did have three episodes of atrial  fibrillation, the longest which was only 76 seconds.   IMPRESSION:  1. Paroxysmal atrial fibrillation now controlled on amiodarone.  2. Status post DDD Guidant Pulsar pacemaker.  3. Coronary artery disease, status post prior percutaneous coronary      interventions.  4. Microvascular angina.  5. Aortic stenosis, moderate.  6. Hypercoagulable state.  7. Hyperlipidemia.   RECOMMENDATIONS:  Kevin is symptomatically much better and has much less  biventricular pacing.  I initially planned to put in a M-mode today but  I think in view of the fact that he has had very little ventricular  pacing, that we can leave him where he is.  He recently amiodarone  surveillance blood work done in his office and he will fax those to [place]  or bring them in.  His arrhythmias have  been no different on the 200  from 100 amiodarone and he has a preference to be on 100 and I think it  is reasonable, so will leave him on 100 amiodarone.  I will plan to see  him back in follow-up in three months.     Bruce Elvera Lennox [name], MD, [facility]  Electronically Signed    BRB/MedQ  DD: 04/10/2007  DT: 04/11/2007  Job #: [id number]

## 2010-07-14 NOTE — Assessment & Plan Note (Signed)
[facility]                            CARDIOLOGY OFFICE NOTE   NAME:[name], [name]                     MRN:          [id number]  DATE:09/16/2006                            DOB:          08-28-1934    [name] came in for a final pacemaker check before he leaves for  the [facility]. He has been feeling fairly well, but he does have  some episodes where he feels that his heart is beating hard. It does not  feel fast or irregular. This sometimes occurs with exercise and  occasionally occurs at rest.   On interrogation of his pacemaker, he is pacing about the atrium 96% of  the time, but about 4% of the time, he is sensing an atrial rate of 70.  This corresponded with some APCs that we documented on his interrogation  from which he was symptomatic despite the fact that the APCs did not  occur very early and conducted with the intrinsic conduction. His  ventricular is sensed 99% of the time, so the prolonged AV delay of 400  appears to be allowing intrinsic conduction almost all of the time. His  rate response was not very vigorous, but we decided that we could change  that when he comes back from his trip.   We will plan to leave his settings as they are. We may have to tolerate  some symptomatic APCs.   His pacemaker wound looks very good. There is one stitch that is  slightly protruding and we clipped that today.     Bruce Elvera Lennox [name], MD, [facility]  Electronically Signed    BRB/MedQ  DD: 09/16/2006  DT: 09/17/2006  Job #: [id number]

## 2010-07-14 NOTE — Assessment & Plan Note (Signed)
[facility]                            CARDIOLOGY OFFICE NOTE   NAME:LEBAUERAlexiz, Sustaita                     MRN:          [id number]  DATE:05/18/2007                            DOB:          1934/05/11    PRIMARY CARE PHYSICIAN:  Unknown   CLINICAL HISTORY:  Gene Feinberg returns for our management of his  coronary heart disease, pacemaker, and arrhythmias.  He had been doing  fairly well, but he does have episodes daily where he feels a discomfort  feeling in his chest.  He thinks this may be when he has ventricular  pacing, but he cannot be absolutely sure.  He does have chronic  exertional chest discomfort, but this has not changed, and does not  appear to be terribly disabling at present.   PAST MEDICAL HISTORY:  Significant for the problems outlined below.   CURRENT MEDICATIONS INCLUDE:  1. Flomax.  2. Fish oil.  3. Aspirin.  4. Protonix.  5. Hydrochlorothiazide 25 mg daily.  6. Coumadin.  7. Crestor 20 mg daily.  8. Amiodarone 200 mg daily.  9. Norvasc 5 mg daily which is new for blood pressure.   PHYSICAL EXAMINATION:  On examination blood pressure is 148/77; pulse 60  and regular.  There was no venous distention.  The carotid pulses were full with  transmitted bruits from below.  CHEST:  Clear without rales or rhonchi.  CARDIAC:  Rhythm was regular.  There is a 2/3 systolic ejection murmur  at the left sternal edge radiating to the base.  I could hear no  diastolic murmur.  There was no peripheral edema.  The pedal pulses were equal.   We interrogated his pacemaker and he only had two very short episodes of  atrial fibrillation, both less than 1 minute.  He was pacing a little  less than 1% in the time.   We had done a Holter monitor on him, and he had several episodes where  he had a paced rhythms in the range of 60-70.  We thought this was most  likely an SVT that did not result in mode switching which he was  tracking 2:1.  He  is programmed to an AV delay of 400.   IMPRESSION:  1. Paroxysmal atrial fibrillation and fibrillating ventricular      tachycardia controlled with amiodarone.  2. Status post DV Guidant pacemaker with recent generator change.  3. Coronary artery status post prior percutaneous coronary      intervention.  4. Microvascular angina.  5. Moderate aortic stenosis.  6. Hypercoagulable state.  7. Hyperlipidemia  8. Degenerative disease of the shoulder.   RECOMMENDATIONS:  Overall, I think, Gene he is doing pretty well.  Again, he had laboratory studies for amiodarone surveillance, and his  liver tests were okay, his lipid profile was quite good, and his TSH was  okay.  We have programmed him to AAI to eliminate the ventricular  pacing.  We have not seen any AV block at any time greater than first  degree AV block, and when he went into atrial fibrillation he had a  ventricular response rather than a paced response except for a few  beats.  This makes me think that he would not be at significant risk of  high degree of block and profound bradycardia.  His underlying rhythm,  now, is sinus bradycardia at rate of about 35.  I will plan to see him  back in about 4 months.     Bruce Elvera Lennox [name], MD, [facility]  Electronically Signed    BRB/MedQ  DD: 05/18/2007  DT: 05/19/2007  Job #: [id number]

## 2010-07-14 NOTE — Assessment & Plan Note (Signed)
[facility]                            CARDIOLOGY OFFICE NOTE   NAME:LEBAUERDelio, Slates                     MRN:          [id number]  DATE:08/15/2006                            DOB:          March 11, 1934    Gene Nitsch came in today to the office, although I did not see him  officially.  He was scheduled to be seen for pre-evaluation prior to a  generator change, but on interrogation of his pacemaker it was found  that he was not at [facility].  He has at least 3 months of battery life after  ERI so we decided to hold off on proceeding with the pacemaker.  He is  also no longer pacer dependent, with an escape rhythm in the low 30's of  sinus bradycardia.  On interrogation of his pacemaker he was atrial  pacing 95% of the time and ventricular pacing 96% of the time with his  AV delay set at 300.   He did preop laboratory studies and his cholesterol was higher than  before at 219 with an HDL of 65 and an LDL of 121.  He had gone off the  Zetia and he is going to go back on that.  His liver function tests were  normal and his TSH was normal.  His BUN was 19, his creatinine was 1.2  and his potassium was 4.1.   Gene is scheduled to leave for [facility] in about 4 weeks and he  should be okay with this.  He has not reached ERI and he has several  months after that and he is not pacer dependent.  Will have him come in  for an office visit in August and arrange to replace his generator when  he reaches ERI.  We have a new Guidant generator with AV delay out to  400 milliseconds, so we can program him out that far if we think that is  optimal.  He seems to be less sensitive to ventricular pacing now since  he is pacing at the present time and not having any symptoms and feeling  well.  He had adjusted his amiodarone to 200 and 300 but we told him  that daily adjustments will not have an effect for some time and  recommended 200 at present.     Bruce Elvera Lennox  [name], MD, [facility]  Electronically Signed    BRB/MedQ  DD: 08/15/2006  DT: 08/16/2006  Job #: [id number]

## 2010-07-14 NOTE — Op Note (Signed)
NAMEROSS, BENDER              ACCOUNT NO.:  [id number]   MEDICAL RECORD NO.:  [id number]          PATIENT TYPE:  OIB   LOCATION:  2855                         FACILITY:  [facility]   PHYSICIAN:  Everardo Beals. 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  Then he had a generator change in 2002  with a Pulsar Max II.  He recently reached ERI and was brought in for a  change in his generator.   PROCEDURES:  Explantation of the old Pulsar Max II [phone])  inspection of the old ventricular leads (atrial lead:  Pacesetter 1028T  implanted May 13, 1989 and ventricular lead:  Pacesetter 1216T  implanted May 13, 1989) and implantation of a new Altrua pacemaker  (serial # [id number]).   ANESTHESIA:  1% of local Xylocaine.   ESTIMATED BLOOD LOSS:  Less than 10 mL.   INDICATIONS:  Old generator reaching ERI.   COMPLICATIONS:  None.   PROCEDURES:  The procedure was performed in laboratory room #2.  The  left anterior chest was prepped and draped in the usual fashion.  The  skin and subcutaneous tissue were anesthetized with 1% local Xylocaine.  The incision was made over the pacemaker pocket just inferior to the  previous incision.  This was extended to the pocket, and the pocket was  opened and the generator was removed.  The leads were inspected with  parameters as described below.  The pocket was irrigated with sterile  kanamycin solution.  The new generator was implanted into the pocket.  The subcutaneous tissue was closed with running 2-0 Vicryl.  The skin  was closed with running 4-0 Vicryl.   THE PATIENT'S PARAMETERS:  Atrial lead impedance:  560 ohms.  Threshold  1.7 volts with a pulse width of 0.5.  Intrinsic is not evaluated due to  low atrial  rate.   Ventricular lead impedance:  575 ohms.  Threshold 2.2 volts with a pulse  width of 0.5.  The R wave was not evaluated.   The patient tolerated the procedure well and left the laboratory in  satisfactory condition.      Bruce Elvera Lennox [name], MD, [facility]  Electronically Signed     BRB/MEDQ  D:  08/22/2006  T:  08/22/2006  Job:  [id number]   cc:   cardiopulmonary

## 2010-07-17 NOTE — Cardiovascular Report (Signed)
NAMETIMARION, AGCAOILI              ACCOUNT NO.:  [id number]   MEDICAL RECORD NO.:  [id number]          PATIENT TYPE:  OIB   LOCATION:  1962                         FACILITY:  [facility]   PHYSICIAN:  Everardo Beals. [name], MD, FACCDATE OF BIRTH:  November 23, 1934   DATE OF PROCEDURE:  05/27/2006  DATE OF DISCHARGE:  05/27/2006                            CARDIAC CATHETERIZATION   TITLE OF PROCEDURE:  Right and left heart catheterization and coronary  angiography.   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  His last catheterization was in  March 2005 at which time he had a 95% stenosis in the circumflex artery,  95% stenosis in the septal perforator and nonobstructive disease in his  remaining vessels.  He also has known microvascular angina and mild  aortic stenosis.  He also has a pacemaker with frequent APCs and runs of  probable SVT.  He recently has had increased symptoms of angina and had  a stress Myoview which showed more marked ST changes than on previous  studies as well as a three-beat run of V-tach although there was no  perfusion abnormality.  He also dropped his blood pressure immediately  post procedure.  For this reason, we decided to evaluate him further  with angiography.   PROCEDURE:  Right heart catheterization was performed percutaneously via  the right femoral vein using a mini sheath and a Swan-Ganz  thermodilution catheter.  Left heart catheterization was performed  percutaneously via the left femoral artery using an arterial sheath and  5-French preformed coronary catheters.  A front-wall arterial puncture  was performed and Omnipaque contrast was used.  We crossed the aortic  valve with a pigtail catheter and a straight wire.  The patient  tolerated the procedure well and left the laboratory in satisfactory  condition.   RESULTS:   The left main coronary artery was free of significant disease.   The left anterior descending artery gave rise to a moderately large  septal perforator and four diagonal branches.  The LAD was irregular  with some calcification and there was 40% narrowing in the proximal LAD  and 95% stenosis in the first septal perforator.  Overall, the disease  in the LAD with somewhat less than previously and the septal perforator  disease was about the same.   The circumflex artery gave rise to a ramus branch and a large  posterolateral branch.  There was 20% narrowing in the proximal  circumflex artery.  There was 0% stenosis at the tandem overlapping  stents in the mid circumflex artery.  The rest of the circumflex system  was free of significant obstruction.   The right coronary artery was a moderately large vessel and gave rise to  two right ventricular branches, a posterior branch and three small  posterolateral branches.  There was 30% narrowing in the proximal right  coronary artery.  There was 40% to 50% narrowing in the mid-right  coronary artery with was some irregularities in the mid-right coronary  artery.   The left ventriculogram from the RAO projection showed no definite areas  of hypokinesis.  The estimated ejection fraction was 50%.  There was 1+  mitral regurgitation.   HEMODYNAMIC DATA:  The right atrial pressure was 9 mean.  The pulmonary  artery pressure was 28/15 with a mean of 21.  Pulmonary wedge pressure  was 12.  Left ventricular pressure was 149/14.  The aortic pressure is  139/78 with a mean of 105.  The cardiac output and cardiac index was  4.8/2.1 liters/minutes/meter squared by Fick and 4.5/2.0  liters/minutes/meter squared by thermodilution.  The peak aortic valve  gradient was 10 mmHg and the mean aortic valve gradient was 13 mmHg.  The calculated aortic valve area was 1.5 cm2.   CONCLUSION:  1. Coronary artery disease status post prior PCI (percutaneous       coronary intervention) with mostly nonobstructive coronary disease      with 40% narrowing in the proximal LAD (left anterior descending      artery) and 95% narrowing in the first septal perforator, 20%      narrowing in the proximal circumflex artery with 0% stenosis at the      stent site in the mid circumflex artery, and 30% narrowing in the      proximal right coronary was 40% to 50% narrowing in the mid-right      coronary artery and normal left ventricular wall motion.  2. Mild aortic stenosis with a peak aortic valve gradient of 10 mmHg,      a mean aortic valve gradient of 13 mmHg and a calculated aortic      valve area 1.5 cm2.   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  I think  his symptoms are related to his microvascular angina rather than  obstructive coronary disease.  His aortic valve stenosis is mild to  moderate at most.  We will plan continued medical therapy of his  microvascular angina and secondary risk factor modification.  We will  consider Ranexa although he has a long QT associated with right bundle  branch block and we are researching the advisability of treatment with  Ranexa.      Bruce Elvera Lennox [name], MD, [facility]  Electronically Signed     BRB/MEDQ  D:  05/27/2006  T:  05/27/2006  Job:  [id number]   cc:   [name], MD, [facility]  Bruce R. [name], MD, [facility]  Cecil Cranker, MD, [facility]

## 2010-07-17 NOTE — Assessment & Plan Note (Signed)
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  Graciela Husbands and [name].  He presents here for second opinion.   PROBLEM LIST:  1. Coronary artery disease.      a.     Status post overlapping Taxus drug eluting stents to the       left circumflex in June of 2004.  These were 3 mm in diameter.      b.     Cardiac catheterization in March 2005 showed a normal left       main, LAD 50% narrowing in the mid-section.  There was a 95%       stenosis in a diagonal branch, as well as high-grade lesion in a       septal perforator.  Left circumflex stents were patent.  There was       a 40% ostial narrowing in a sub-branch of the ramus.  The right       coronary artery had 30% tandem lesions proximally; EF was 55%.  He       underwent IVUS of the LAD, which showed nonobstructive disease.  2. Chronic stable angina, thought to be due to microvascular disease.  3. Paroxysmal atrial tachycardia and sick sinus syndrome, status post      pacemaker, followed by Dr. Graciela Husbands.  4. Hyperlipidemia.  5. Mild to moderate aortic stenosis with a mean gradient of 20 by      echocardiogram.  6. Minimal carotid artery disease, 0-39% bilaterally.  7. History of CVA, secondary to occluded left vertebral.  8. Elevated sed rate.  9. History of hypercoagulable disorder with details unclear.   CURRENT MEDICATIONS:  1. Flomax 0.4 a day.  2. Zetia 10 a day.  3. Fish oil.  4. Multivitamin.  5. Plavix 75 a day.  6. Toprol XL 200.  7. Aspirin 81.  8. Co-enzyme Q10.  9. Caduet 10/40.  10.Lipitor 40 on top of that.  11.HCTZ 25.  12.Nexium 40.   ALLERGIES:  No known drug allergies.   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  He remains  very active.  He says when he starts exercising, he often has a little  bit of chest pressure, but he works through it and it does not come back  unless he pushes himself very hard.  For a while, this almost totally  resolved, but he says, of late, this has come back to some degree.  He  has also been having problems with palpitations.  He was seen by Dr.  Allena Katz, down in [place], who told him he had atrial bigeminy.  However, he is very concerned about the possibility of atrial  fibrillation.  He notes that his blood pressure has also been quite  labile.  He denies any congestive heart failure symptoms.   PHYSICAL EXAM:  He is well-appearing, in no acute distress.  He  ambulates around the clinic without any respiratory difficulty.  Blood  pressure is 122/80 with a heart rate of 60.  His weight is 230 pounds.  HEENT:  Sclerae are anicteric.  EOMI.  There are no xanthelasmas.  Mucous membranes are moist.  Oropharynx is clear.  NECK:  The neck is supple, there is no JVD.  Carotids are 2+  bilaterally.  There are soft bruits bilaterally.  There is no  lymphadenopathy or thyromegaly.  CARDIAC:  He has a regular rate and rhythm.  His point of maximum  impulse is not displaced.  There is a 2/6 mid-peaking systolic murmur  heard across the precordium.  S2 is well-preserved.  There is no rub or  gallop.  LUNGS:  Clear.  ABDOMEN:  Soft, nontender, nondistended.  No obvious hepatosplenomegaly,  no bruits, no masses appreciated.  Good bowel sounds.  EXTREMITIES:  Warm with no cyanosis, clubbing or edema and no rashes.  Dorsalis pedis pulses are 1+.   EKG:  Shows an irregular rhythm, appears to be atrial fibrillation with  occasional PVCs.  Ventricular rate is 75 beats per minute.   ASSESSMENT AND PLAN:  1. Coronary artery disease with microvascular angina.  I suggested to      him that he consider either going back on  long-acting      nitroglycerin, like Imdur, or trying to premedicate before exercise      with short-acting nitroglycerin.  He said he felt it was quite      tolerable and would do this if it was getting any worse.  I also      suggested the possibility that he may have a component of exercise-      induced bronchoconstriction, given his susceptibility to cold      weather, and we will set him up for a cardiopulmonary exercise test      to further evaluate.  Otherwise, he is quite stable from a coronary      perspective.  2. Atrial dysrhythmia.  I have reviewed the strips with Dr. Graciela Husbands, who      agrees it appears to be atrial fibrillation.  However, on      interrogation of his device, it appears to be a form of atrial      bigeminy and not true fibrillation.  We will continue him on his      Toprol.  At this point, there will be no need for Coumadin.  3. Hypertension, well-controlled at this point.  Continue current      regimen.  4. Hyperlipidemia.  Continue his Caduet and Lipitor.   DISPOSITION:  We will have him follow back up with Dr. [name] after his  CPX test.     Bevelyn Buckles. Bensimhon, MD  Electronically Signed    DRB/MedQ  DD: 04/22/2006  DT: 04/22/2006  Job #: [id number]

## 2010-07-17 NOTE — Cardiovascular Report (Signed)
NAME:  [name], PORTAL                        ACCOUNT NO.:  [id number]   MEDICAL RECORD NO.:  [id number]                   PATIENT TYPE:  INP   LOCATION:  3710                                 FACILITY:  [facility]   PHYSICIAN:  Charlies Constable, M.D. 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  Nine months ago he had placement of tandem overlying stents to the  circumflex artery.  He did well after that.  Over the last week, he has  developed recurrent symptoms of chest pain with exertion strongly suggestive  of angina.  He was admitted to the hospital with the diagnosis of possible  unstable angina.  He also has sick sinus syndrome and has a pacemaker in  place and he has microvascular angina.  Cardiolite scans have been difficult  to interpret in the past.   PROCEDURE:  The procedure was performed via the right femoral artery using  arterial sheath and 6 French preformed coronary catheters. A front wall  arterial puncture was performed and Omnipaque contrast was used.  A distal  aortogram was performed to rule out abdominal aortic aneurysm.  The right  femoral artery was closed with Angio-Seal at the end of the procedure.  The  patient tolerated the procedure well and left the laboratory in satisfactory  condition.   RESULTS:  Left main coronary artery:  The left main coronary was free of  significant disease.   Left anterior descending artery:  The left anterior descending artery gave  rise to two septal perforators, three diagonal branches and then several  more septal perforators.  There was 30-40% narrowing in the proximal LAD,  40% narrowing in the proximal to mid LAD and 50% narrowing at the mid LAD.  There was 95% stenosis in the second diagonal branch.  The diagonal lesion  was similar to  his previous lesions.  There may have been mild progression  in the lesion in the proximal LAD.   Circumflex artery:  The circumflex artery gave rise to a ramus branch and AV  branch which terminated into a large posterior lateral branch.  There was  40% ostial narrowing in a subbranch of the ramus branch.  There was 0%  stenosis at the stent sites in the mid circumflex artery.   Right coronary artery:  The right coronary artery is a moderately large  vessel.  It gave rise to two right ventricular branches, posterior  descending branch and three small posterior lateral branches.  There was  irregularity in the proximal right coronary artery with tandem 30% stenoses.   LEFT VENTRICULOGRAM:  The left ventriculogram performed in the RAO  projection showed asynchronous contraction.  All segments move well,  however.  The ejection fraction was  55%.   DISTAL AORTOGRAM:  Distal aortogram was performed which showed patent renal  arteries and no significant aortoiliac obstruction.   The aortic pressure was 165/102 with a mean of 128.  Left ventricular  pressure was 165/23.   CONCLUSION:  Coronary artery disease status post prior stenting of the  circumflex artery June 2004 with 30-40% proximal and 50% mid stenosis in the  left anterior descending with 95% stenosis in a septal perforator, 40%  narrowing in a subbranch of the ramus branch of the circumflex artery with  0% stenosis at the stent sites, and 30% narrowing in the right coronary  artery with good LV function with asynchronous contraction.   RECOMMENDATIONS:  Gradie's coronary disease appears to be primarily  nonobstructive disease.  The etiology of his symptoms is not clear.  It is  still possible he could have an acute coronary syndrome with ruptured plaque  and distal embolization, but there is no clear evidence for ruptured plaque  angiographically.  He could have had a flare up in his microvascular angina  although there is no  clear precipitating cause.  Will plan medical treatment  and will add Imdur to his current treatment regimen.  Will get an outpatient  adenosine rest/stress Cardiolite scan as a baseline and also to assess his  perfusion even though this may be difficult to interpret in view of his  paced rhythm and previous Cardiolite scans.  Will also get a D-dimer and  venous Dopplers to rule out an atypical presentation for pulmonary embolism.  He does have a strong positive family history for this.                                               Charlies Constable, M.D. [facility]    BB/MEDQ  D:  05/13/2003  T:  05/13/2003  Job:  [id number]   cc:   [name], M.D. [facility]

## 2010-07-17 NOTE — Cardiovascular Report (Signed)
NAME:  [name], [name]                        ACCOUNT NO.:  [id number]   MEDICAL RECORD NO.:  [id number]                   PATIENT TYPE:  OIB   LOCATION:  2920                                 FACILITY:  [facility]   PHYSICIAN:  Charlies Constable, M.D.                  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  He  also has supraventricular tachycardia and sick sinus syndrome and has a DDD  pacer in place.  He recently has had increased symptoms of exertional angina  over the past number of months.  He saw  Dr. [name] and decision was made to  evaluate him with angiography.  Previous Cardiolite scans have been positive  in the face of non-obstructive coronary disease and it has been felt that  the scans were possibly related to microvascular angina.  For this reason,  the scan was not thought to be too helpful.   PROCEDURE:  The procedure was performed via the right femoral artery and  arterial sheath and 6 French preformed coronary catheters.  A front wall  arterial puncture was performed and Omnipaque contrast was used.  Distal  aortogram was performed to rule out abdominal aortic aneurysm.   After completion of the diagnostic study and after review of the films with  Dr. Riley Kill, Dr. [name] and Dr. [name], we made a decision to proceed with  intervention on the tandem circumflex lesions.  The patient was given  Angiomax bolus and infusion was given 300 mg of Plavix.  We used a 6 [place]  CLS 3.5 guiding catheter with side holes and a Forte wire.  We crossed the  lesions in the circumflex artery with the wire without too much difficulty.  We initially predilated with a 2.5 x 20-mm Maverick performing two  inflations up to 8 atmospheres for  30 seconds.  We then deployed a 3.0 x 32-  mm Taxus stent covering the distal portion of the three tandem lesions.  We  deployed this with one inflation up to 14 atmospheres for 30 seconds.  We  then deployed a second  3.0 x 12-mm Taxus stent just overlapping the first stent and extending just  shy of an area where the vessel became somewhat ectatic.  We deployed this  with one inflation of 18 atmospheres for 30 seconds.  We then post dilated  with a 3.5 x 15-mm Quantum Maverick and performed a total of three  inflations at the proximal edge and over the overlapping stents and the  proximal portion of the distal stent performing inflations up to 15  atmospheres for 30 seconds.  Repeat diagnostics were then performed through  the guiding catheter.  The patient tolerated  the procedure well and left the  laboratory in satisfactory condition.   RESULTS:  Left main coronary artery:  The left main coronary was free of  significant disease.   Left anterior descending artery:  The left anterior descending artery gave  rises to four diagonal branches and a septal perforator.  There was 50%  narrowing in the mid LAD.  There were mild irregularities in the proximal  and mid LAD.   Circumflex artery:  The circumflex artery gave rise to an early marginal  branch and then AV branch which terminated in large posterior lateral  branch.  There was 70% narrowing in the sub branch of the first marginal  branch.  There were tandem 70, 80 and 40% stenoses in the mid to distal  circumflex artery.   Right coronary artery:  The right coronary artery is a mildly large vessel  that gave rise to a conus branch, two right  ventricular branches, posterior  descending branch and three posterior lateral branches.  There was 30%  proximal and 30% narrowing in the mid vessel and irregularities in the  proximal mid and distal vessel.   LEFT VENTRICULOGRAM:  The left ventriculogram performed in the RAO  projection  showed good wall motion with no areas of hypokinesis.  The  estimated ejection fraction was 60%.   DISTAL AORTOGRAM:  Distal aortogram was performed which showed patent renal  arteries and no significant aortoiliac instructions.  The ostium of the left  renal artery was not well visualized.   Following placement of two overlapping Taxus stents across three tandem  lesions in the mid and distal segments, the stenoses improved from 70 and  80% to 0%.   The aortic pressure was 164/93 with a mean of 123 and left ventricular  pressure was 164/22.   CONCLUSIONS:  1. Coronary artery disease with 50% narrowing in the mid left anterior     descending artery, 70% narrowing in the sub branch of the first marginal     branch of the first marginal branch of the circumflex artery, 70 and 80%     stenosis in the mid circumflex artery and 40% narrowing in the mid to     distal circumflex artery, 30% proximal and 30% mid narrowing in the right     coronary artery and normal LV function.  2. Successful placement of tandem overlying Taxus stents in the mid and mid     to distal circumflex artery with improvement in the narrowing of tandem     lesions from 70 and 80% to 0%.   DISPOSITION:  The patient is admitted for further observation.                                                 Charlies Constable, M.D.    BB/MEDQ  D:  08/24/2002  T:  08/26/2002  Job:  [id number]  [name], M.D.  [address]  [address]  [place]  [place] [address]   cc:   [name], M.D.  [address]  [address]  [place]  [place] [address]

## 2010-07-17 NOTE — Assessment & Plan Note (Signed)
[facility]                                 ON-CALL NOTE   [name], [name]                       MRN:          [id number]  DATE:06/11/2006                            DOB:          06/02/1934    TIME OF CALL:  11:30 to 12:00 on Saturday April 12.   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  The patient has been taking Lovenox 100 mg subcutaneously every 12 hours  since earlier in the week.  I prepared 3 syringes to get him through  this morning yesterday in the office.  I contacted the patient via voice  mail.  He called me back at 11:50 today.  He told me that he was not  having any bleeding or bruising out of the ordinary.  He does have some  continued bruising in his abdominal area, but is otherwise doing well.  He has had no other symptoms associated with anticoagulation therapy  problems.  The patient has been compliant with his regimen of 8 mg on  Thursdays and 6 mg on Friday the 11th.  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  The  patient will call with questions or problems in the meantime.      Shelby Dubin, PharmD, BCPS, CPP  Electronically Signed      Rollene Rotunda, MD, [facility]  Electronically Signed   MP/MedQ  DD: 06/11/2006  DT: 06/11/2006  Job #: [id number]   cc:   Everardo Beals. [name], MD, [facility]  Madolyn Frieze. Jens Som, MD, [facility]  Doylene Canning. [name], MD

## 2010-07-17 NOTE — Assessment & Plan Note (Signed)
[facility]                            CARDIOLOGY OFFICE NOTE   ERICSON, NAFZIGER                     MRN:          [id number]  DATE:06/13/2006                            DOB:          12-Mar-1934    ADDENDUM   Gene's pacemaker is nearing ERI, and we estimate about 6 months.  He  plans a trip on July 20th, so we might want to consider a generator  change in early June, so he will have time to heal before his trip.  We  will address this again, when we see him in 4 weeks.     Bruce Elvera Lennox [name], MD, [facility]     BRB/MedQ  DD: 06/13/2006  DT: 06/13/2006  Job #: [id number]

## 2010-07-17 NOTE — Assessment & Plan Note (Signed)
[facility]                            CARDIOLOGY OFFICE NOTE   JESTER, KLINGBERG                     MRN:          [id number]  DATE:06/13/2006                            DOB:          May 21, 1934    PRIMARY CARE PHYSICIAN:  None.   ELECTROPHYSIOLOGIST:  [name], M.D.   CLINICAL HISTORY:  [name] was scheduled to come in tomorrow for a TEE-  guided cardioversion after his recent onset of atrial fibrillation.  He  called me yesterday and felt like his rhythm had returned to normal  sinus rhythm.  He had an ECG in his office today which documented sinus  rhythm.  We cancelled the TEE-guided cardioversion and he came in today  for a followup visit.   He is feeling much better since his rhythm converted.  He has not  stressed himself enough to know if he has had much microvascular angina.   His current medications include:  1. Amiodarone 400 mg a day.  2. Toprol-XL 200 mg daily.   He is also taking Flomax, Zetia, fish oil, multivitamin, Plavix,  aspirin, and Lipitor.  We had started him on hydrochlorothiazide and  potassium when he went into atrial fibrillation and had questionable  mild fluid retention.   PAST MEDICAL HISTORY:  Significant for the problems outlined below.   On examination today, the pulse rate was 55 and regular.  There was no  venous distention.  The carotid pulses were full with short transmitted  bruits.  The chest was clear without rales or rhonchi.  The cardiac  rhythm was regular.  There was a grade 2/6 systolic ejection murmur at  the left sternal edge radiating to the base.  There was no peripheral  edema and pedal pulses were equal.   We interrogated his pacemaker and he was pacing both chambers with  fusion in the ventricular chamber.   IMPRESSION:  1. Recent onset of atrial fibrillation, now converted to sinus rhythm      on amiodarone.  2. Sick sinus syndrome status post placement of a Guidant DDD  pacemaker.  3. Coronary artery disease status post placement of tandem overlapping      drug-eluting stents in the circumflex artery 3 years ago with      mostly nonobstructive disease at last catheterization.  4. Microvascular angina.  5. Mild aortic stenosis.  6. Good left ventricular function with ejection fraction 50% by left      ventricular angiography and 65% by echocardiography.  7. Hypercoagulable state.  8. Coumadin therapy.   RECOMMENDATIONS:  Fortunately, [name] has converted to sinus rhythm.  Unfortunately, his AV delay is now somewhat longer and he is fusing at  an AV delay of 300.  Fortunately, most of his beat is intrinsic and he  does not feel any of the fusion beats.  Will plan to cut back his  amiodarone from 400 to 200 a day and cut back his Toprol-XL from 200 to  100 a day in hopes that this will keep his AV delay shorter and prevent  him from ventricular pacing.  I  am reluctant to put him back in AAI mode  with a long AV delay and him on amiodarone.  We also cut his rate  response back by decreasing his slope from 9 to 7 and decreasing his  upper rate limit from 120 to 105.  Hopefully this will help his  microvascular angina.  We had previously stopped his Plavix when we  started him Coumadin.  We continued the aspirin.  I told him he could  stop the hydrochlorothiazide and potassium today.  He has already had  baseline labs and pulmonary function tests which he says were normal.  We will plan to see him back in 4 weeks.  We did program him to DDIR  rather than DDD with mode switch so that when he has APCs he will not  track and pace his ventricle, which he is very sensitive to.  If his  microvascular angina is not improved with control of his heart rhythm  and changes in his rate response we may consider Norvasc.  Ranexa has  been used in the past with amiodarone but it is not approved for this  purpose.  Our options for other drugs for his atrial fibrillation  are  limited because of his long QT.   ADDENDUM:  [name]'s pacemaker is nearing ERI, and we estimate about 6  months.  He plans a trip on July 20th, so we might want to consider a  generator change in early June, so he will have time to heal before his  trip.  We will address this again, when we see him in 4 weeks.     Bruce Elvera Lennox [name], MD, [facility]  Electronically Signed    BRB/MedQ  DD: 06/13/2006  DT: 06/13/2006  Job #: [id number]

## 2010-07-17 NOTE — Assessment & Plan Note (Signed)
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  He had been seen in  [place], where he had had palpitations and had frequent PACs.  Interrogation of his device demonstrated that he was having atrial  tachycardia and this was programmed to detect at rates above 120 beats  per minute, I think.  We ended up reprogramming him down to 100 beats  per minute, as he is chronotropically incompetent (or it could be 140 to  120).   We will plan to get a 24-hour Holter monitor to see how much of this he  is having to consider further options and one of the other issues is he  has very prolonged AV conduction and in fact, his device is programmed  in the AAIR mode because he was having with his prolonged AV conduction,  occasional retrograde conduction when in the atrial pacing mode,  resulting in PMT, which we programmed around by programming an AAI.  I  am concerned about the use of Augmentin and AV nodal blocking agents or  intermediate drugs because of the potential for aggravating his AV  block.   We will plan to see him again.  I will plan to review his Holter monitor  and get back up with him before he takes his next trip, which I think is  a week from Friday.     [name], MD, [facility]  Electronically Signed    SCK/MedQ  DD: 05/18/2006  DT: 05/19/2006  Job #: [id number]

## 2010-07-17 NOTE — Op Note (Signed)
NAME:  [name], [name]                        ACCOUNT NO.:  [id number]   MEDICAL RECORD NO.:  [id number]                   PATIENT TYPE:  OBV   LOCATION:  0364                                 FACILITY:  [facility]   PHYSICIAN:  Timothy E. Earlene Plater, M.D.              DATE OF BIRTH:  [birth year]   DATE OF PROCEDURE:  02/18/2003  DATE OF DISCHARGE:                                 OPERATIVE REPORT   PREOPERATIVE DIAGNOSES:  1. Rectal mucosal prolapse syndrome.  2. Internal and external hemorrhoids.   POSTOPERATIVE DIAGNOSES:  1. Rectal mucosal prolapse syndrome.  2. Internal and external hemorrhoids.   OPERATION/PROCEDURE:  External hemorrhoidectomy and procedure for prolapse  and hemorrhoids anopexy.   SURGEON:  Timothy E. Earlene Plater, M.D.   ANESTHESIA:  General.   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  He has recently improved his function with  MiraLax on a regular basis and improved perianal care.  However, he does  have rectal mucosal prolapse daily with pain and bleeding.  Multiple  attempts have been made to treat this conservatively and he remains  symptomatic.  The rectal mucosal prolapse syndrome is relatively new  compared to his ordinary internal and external hemorrhoids.  He does have  significant cardiac history that has been cleared and his Plavix has been  stopped.  This has been carefully discussed with Dr. [name] and he is ready  to proceed with the surgery.   DESCRIPTION OF PROCEDURE:  He was identified, permit signed, evaluated by  anesthesia.  PASOs were applied. The patient was taken to the operating  room, placed supine.  LMA anesthesia provided.  He was placed in the  lithotomy. IV antibiotics had been given.  Perianal area was inspected,  prepped and draped in the usual fashion.  Hemorrhoids were very prominent.  Mucosal  prolapse was prominent.  All this area was gently massaged and  reduced and Marcaine, epinephrine and Wydase were injected around about the  anal orifice for wide field block and this was massaged which helped restore  some of the normal anatomy.  The anus was gently dilated.  The operating  anoscope was placed.  The rectal vault was cleansed with Betadine and then  that was cleared away and then I proceeded with the [facility] anopexy by placing a  pursestring suture of 2-0 Prolene approximately 4 cm proximal to the dentate  line in a circumferential fashion around the rectal mucosa.  The suture was  thought to be satisfactory.  The PPH stapler was opened completely and then  the head of the staple was placed through the pursestring suture which was  then tied around the shaft of the stapling device and then the shaft and  body of the stapling device were screwed on to the head of the  stapling  device.  It was fired and held and then removed.  A generous portion of  rectal mucosa was 360 degrees and intact.  The area of surgery was carefully  checked over a period of 25 minutes.  A couple of sutures were placed but  there was no significant bleeding; in fact, no bleeding at all after the  sutures were tied.  The surface of the anus was then inspected and three  large external hemorrhoids were removed and their base was closed with a  running 4-0 chromic, and with this the procedure was complete.  Again all  areas were inspected and no bleeding or complications.  Gelfoam gauze and  dry sterile dressing applied.  The patient was removed to the  recovery room, subsequently to outpatient, but plans for discharge were  interrupted by pain which required IV narcotics and he was then to be  admitted overnight.  The patient will be seen and followed as an outpatient  after discharge.                                               Timothy E. Earlene Plater, M.D.    TED/MEDQ  D:  02/18/2003  T:  02/18/2003   Job:  [id number]

## 2010-07-17 NOTE — Assessment & Plan Note (Signed)
[facility]                            CARDIOLOGY OFFICE NOTE   RANEY, KOEPPEN                     MRN:          [id number]  DATE:12/07/2006                            DOB:          1934/10/14    I reviewed Gene's event monitor with Berton Mount.  There is one period  where he appears to have wide complex ventricular beats, some of which  change morphology a little bit.  There are also double spikes after the  QRS.  We think these are probably artifact.  We suspect that, as his  rate increases and his atrial spike comes closer to the QRS, that he is  having longer AV delays and then pacing his ventricle and getting  symptomatic from that.  We do not see any definite spikes related to his  QRS, so we cannot be certain of that, but that is what our suspicion is.   I think I will have Gene scheduled to have a treadmill test.  We ought  to be able to define this at the time of the treadmill testing.  It is  difficult to tell on the event monitor because the clarity is not as  good.  We also might consider programming him to AAI mode, despite his  long AV delay.     Bruce Elvera Lennox [name], MD, [facility]  Electronically Signed    BRB/MedQ  DD: 12/07/2006  DT: 12/07/2006  Job #: [id number]

## 2010-07-17 NOTE — Assessment & Plan Note (Signed)
[facility]                                 ON-CALL NOTE   [name], [name]                       MRN:          [id number]  DATE:06/02/2006                            DOB:          [birth year]    Telephone conversation June 02, 2006 at 2107 through the answering  service concerning [name], patient of Dr. Odessa Fleming.   The phone call is from Chales Abrahams with [employer] at [phone]. I  returned the call promptly, it states [name] regarding urgent  EKG report. I spoke with Chales Abrahams. 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 on  June 02, 2006. Heart rate was approximately 92, irregular rhythm  diagnosed as atrial fib by the [facility] staff. I asked her to fax the  rhythm strip to our office. She stated she would. 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 and if not to call the office in the  morning. He returned a page to me approximately 30 minutes later. I  discussed with him the interpretation of the alarm. He was asymptomatic.  He states he has palpitations frequently and did not notice anything  different during that time period. I told him we could continue to  monitor and I had informed Chales Abrahams at the [employer] to call me if the  atrial fibrillation continued or the rate increased. The patient to get  in touch with Dr. Graciela Husbands once rhythm strip is reviewed to confirm it is  indeed atrial fib as he has a history of extensive PACs that has been  misinterpreted in the past. He agreed to seek medical assistance if his  symptoms changed.      Dorian Pod, ACNP  Electronically Signed      [name], MD, [facility]  Electronically Signed   MB/MedQ  DD: 06/03/2006  DT: 06/03/2006  Job #: [id number]

## 2010-07-17 NOTE — Cardiovascular Report (Signed)
NAMETAARIQ, LEITZ                        ACCOUNT NO.:  [id number]   MEDICAL RECORD NO.:  [id number]                   PATIENT TYPE:  OIB   LOCATION:  2899                                 FACILITY:  [facility]   PHYSICIAN:  Charlies Constable, M.D. 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  He was studied last week for recurrent angina and found to have  what was thought to be nonobstructive coronary disease.  Because of  persistent symptoms and because of CT angiogram performed at [facility] that  suggested more severe proximal LAD disease, he was brought back to the lab  for repeat evaluation with angiography and intravascular ultrasound.   PROCEDURE:  The procedure was performed via the left femoral artery using  arterial sheath and 6 French preformed coronary catheters. A front wall  arterial puncture was performed and Omnipaque contrast was used.  After  completion of the diagnostic study, we performed IVUS on the LAD.  We used a  Q4 6 [place] guiding catheter with side holes and a PT2 wire.  We passed the  wire down the LAD without too much difficulty.  After intracoronary  nitroglycerin, we passed the Atlantis catheter past the mid portion of the  LAD and did automatic pullback.  We had to do two separate runs because of  the long length of the vessel studied.  Repeat diagnostic study was then  performed through the guiding catheter.  The patient tolerated the procedure  well and left the laboratory in satisfactory condition.   RESULTS:  The aortic pressure was 138/77 with mean of 103.   Left main coronary artery:  The left main coronary was free of significant  disease.   Left anterior descending artery:  The  left anterior descending artery gave  rise to a large septal perforator and four diagonal branches.  There was 95%  stenosis at the ostium of the large septal perforator which then bifurcated  into two large septal perforators.  There was 30% narrowing in the proximal  LAD before the septal perforator.  There was 40% narrowing in the mid vessel  before and after the second diagonal branch and there was 40% narrowing in  the mid to distal vessel after the fourth diagonal branch.   Circumflex artery:  The circumflex artery gave rise to an early marginal  branch and two posterior lateral branches.  There was 50% narrowing in one  of the subbranches of the marginal branch.  There was less than 10%  narrowing at the overlapping stents in the mid circumflex artery.   Right coronary artery:  The right coronary artery is a  moderate size vessel  that gave rise to a right ventricular branch, posterior descending branch  and three posterior lateral branches. There were tandem 30% stenoses in the  proximal vessel.  There were irregularities in the distal vessel.   No left ventriculogram was performed.   IVUS measurements of the LAD showed a distal reference diameter of 2.5 x  2.5.  The lesion in the mid LAD measured at its minimal diameter 1.9 x 2.0  mm.  The proximal reference was 3.3 x 3.5 mm and the proximal LAD at its  smallest dimensions was 2.7 x 2.9 mm.  The calculated aortic stenosis was  only 25%.   CONCLUSION:  Coronary artery disease with 30% narrowing in the proximal LAD,  40% narrowing in the mid and 40% narrowing in the distal LAD with 95%  stenosis in a large septal perforator, 50% narrowing in a subbranch of the  first marginal branch of the circumflex artery with less than 10% stenosis  at the stent site in the mid circumflex artery and 30% narrowing in the  proximal right coronary artery.   RECOMMENDATIONS:  The lesions in the LAD do not appear to be obstructive  with lumen  dimensions that appear to be quite adequate.  I think it is quite  possible that the septal perforator could be causing the symptoms since it  is very tight and since it is a very large septal perforator.  Alternatively, his symptoms could be related to microvascular dysfunction.  We will plan continued medical therapy in hopes that this will stabilize  with time and he may develop collaterals to the septal perforator.                                               Charlies Constable, M.D. [facility]    BB/MEDQ  D:  05/20/2003  T:  05/20/2003  Job:  [id number]   cc:   [name], M.D. [facility]

## 2010-07-17 NOTE — Op Note (Signed)
[facility]  Patient:    [name], [name]                     MRN: [id number] Proc. Date: 04/15/00 Adm. Date:  [id number] Disc. Date: [id number] Attending:  Nathen May CC:         Electrophysiology Laboratory  Simek, Attention Fidela Salisbury, M.D. 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  PROCEDURE:  Explantation of a previously-implanted Pacesetter pulse generator with implantation of a Guidant rate-responsive pulse generator.  DESCRIPTION OF PROCEDURE:  Following the obtaining of informed consent, the patient was brought to the electrophysiology laboratory, placed on the fluoroscopic table in supine position.  After routine prep and drape of the left upper chest, lidocaine was infiltrated in the prepectoral and subclavicular regions at the site of the previous incision.  The incision was made and carried down to the layer to the pacemaker pocket, using blunt dissection and electrocautery.  The pocket was opened and with care, the pocket was expanded to allow for the extraction of the previously-implanted lead.  The leads were scarred, and they were freed up from the underlying scar tissue, and a scar tissue in the proximal quarter of the wound both anteriorly and posteriorly was removed.  The pocket was copiously irrigated with antibiotic-containing saline solution, and hemostasis was obtained.  Assessment of the Pacesetter atrial lead 1028, serial number [id number], demonstrated a P-wave of 4.4 with a pacing impedance of 613 and a pacing threshold of 1.2 volts at 0.5 milliseconds with a currented threshold of 1.9 MA.  The ventricular pacing lead was a 1216T, [id number].  R-wave was 14.6 with an impedance of 539 Ohms and a pacing thresholds at 2.2 volts and  0.5 milliseconds.  These were consonant with noninvasive measurements obtained previously.  With these acceptable parameters recorded, the leads were then attached to a Pulsar Max DR model 1280 pulse generator, serial number [id number].  Atrial and then AV pacing were identified.  The pocket was again copiously irrigated with antibiotic-containing saline solution.  Hemostasis was assured.  The leads and the pulse generator were then placed in the pocket.  The pocket was affixed to the prepectoral fascia because of the MDO2 sensor.  The wound was closed with three layers in the normal fashion.  The wound was washed, and a benzoin and Steri-Strip dressing was then applied.  Needle counts, sponge counts, and instrument counts were correct at the end of the procedure according to the staff. DD:  04/15/00 TD:  04/16/00 Job: [id number]

## 2010-07-17 NOTE — Assessment & Plan Note (Signed)
[facility]                               LIPID CLINIC NOTE   NAME:[name], [name]                     MRN:          [id number]  DATE:06/09/2006                            DOB:          [birth year]    TELEPHONE DOCUMENTATION:  I received lab work at 5:30 today regarding  Dr. Henrene Hawking PT/INR.  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  In review of the patient's current medications,  he states that he has been using two 40 mg syringes of Lovenox twice  daily to approximate an 80 mg dose.  I have asked the patient to  increase this to 100 mg and offered to put his syringes in a single-use  agent for him or show him how to do that for future injections.   The patient has had no bleeding.  He has had no weakness, fatigue.  He  has had dizziness associated with this irregular heart rhythm but has  had no other issues.  He states that he has had no dark, tarry bowel  movements.   His hemoglobin and hematocrit reveal 11.7 and 34.5.  Platelet count 201.   With the patient's INR of 1.5 and the plan for TEE-guided cardioversion  on Monday morning with Dr. Lewayne Bunting, I have asked the patient to  take 8 mg of warfarin today and 6 mg of warfarin tomorrow.  He will  report to the [facility] lab on Saturday morning for an INR.  I will  have that called to my pager.  Patient will continue with other  medications.  General cautions regarding his use of concomitant  medications has been given.  Patient will call with questions or  problems.      Shelby Dubin, PharmD, BCPS, CPP  Electronically Signed      Everardo Beals [name], MD, [facility]  Electronically Signed   MP/MedQ  DD: 06/09/2006  DT: 06/09/2006  Job #: [id number]   cc:   Anticoagulation clinic  E. Graceann Congress, MD, [facility]  Doylene Canning. [name], MD

## 2010-07-17 NOTE — Assessment & Plan Note (Signed)
[facility]                            CARDIOLOGY OFFICE NOTE   [name], [name]                     MRN:          [id number]  DATE:05/09/2006                            DOB:          November 03, 1934    PRIMARY CARE PHYSICIAN:  None.   ELECTROPHYSIOLOGIST:  [name], MD, [facility].   CLINICAL HISTORY:  [name] is 76 years old and came in for a visit today  after his Myoview scan last Friday.  He has been having some increased  symptoms of angina, and I had seen him recently, and Dr. Arvilla Meres had seen him recently and did a CPX on him.  The CPX test  showed rather marked ST depression, and we arranged for him to have a  Myoview scan.  On his Myoview scan, he developed chest pain and some  ventricular ectopy and had a 3-beat run of ventricular tachycardia post  exercise.  He had ST segment changes at rest which did not change much  post exercise.  His perfusion scans were normal.  He did have a drop in  his blood pressure immediately after exercise which returned to previous  level fairly quickly.  I could not tell on the strips whether he was  atrial paced when he increased his rate, but from interrogating his  pacemaker it appears that his increase in heart rates are related to  atrial paced rhythm rather than intrinsic sinus rhythm.   He has also had symptoms of palpitations and has had documentation of  atrial bigeminy and PACs.  He says he has quite a few symptoms of  palpitations at night.   PAST MEDICAL HISTORY:  1. Hyperlipidemia.  2. Aortic stenosis, with a mean aortic valve gradient of 20 mmHg in      November 2007.  3. He has had previous placement of tandem stents in the circumflex      artery, and had a 95% stenosis in his septal perforator at last      catheterization, which was March 2005.  4. He also has a Guidant generator which is programmed to AAI mode.   CURRENT MEDICATIONS:  1. Norvasc 10 mg.  2. Protonix.  3.  Flomax.  4. Zetia.  5. Fish oil.  6. Plavix.  7. Toprol 200 mg daily.  8. Aspirin.  9. CoQ10.  10.Lipitor 80.  11.Hydrochlorothiazide 25 mg.   PHYSICAL EXAMINATION:  VITAL SIGNS:  Blood pressure was 92/68, pulse 60  and regular.  NECK:  There was no venous distention.  The carotid pulses were full,  without bruits.  CHEST:  Clear.  HEART:  Rhythm was regular.  There was a grade 3/6 systolic ejection  murmur at the left sternal edge radiating to the base.  There was no  diastolic murmur.  S2 was slightly decreased.  ABDOMEN:  Soft, with normal bowel sounds.  No hepatosplenomegaly.  EXTREMITIES:  Peripheral pulses were full, with no peripheral edema.   IMPRESSION:  1. Coronary artery disease, status post placement of tandem Taxus      stents in the circumflex artery, with nonobstructive  disease at      last catheterization in 2005, with the exception of 95% stenosis of      the large septal perforator.  2. Microvascular angina, with some increased symptoms in recent      months.  3. Recent Myoview scan, with no evidence of ischemia, with no      perfusion defects, but with ST-T changes and ventricular ectopy.  4. Sick sinus syndrome, with atrial premature contractions.  5. Status post Guidant dual-chamber pacemaker, programmed to AAI mode.  6. Treated hyperlipidemia.   RECOMMENDATIONS:  Although the scan does not show any ischemia, there  are several things a little different from the previous scan.  There was  ventricular ectopy, a drop in blood pressure, and more ST-T changes on  the resting ECG.  Based on all these considerations, I think further  evaluation with angiography would be the best approach.  I discussed  this with [name], and he is agreeable to this.  If he does not have any  progression of his coronary disease, I think we should consider Ranexa.  His baseline QT is prolonged, with his right bundle branch block, and we  will review that further before making a  final decision.  We have  scheduled him to come in for the [facility] lab Friday morning.     Kevin Elvera Lennox [name], MD, [facility]  Electronically Signed    BRB/MedQ  DD: 05/09/2006  DT: 05/09/2006  Job #: [id number]

## 2010-07-17 NOTE — Assessment & Plan Note (Signed)
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  He is a very  pleasant man with a history of symptomatic bradycardia and who has felt  poorly for the last several months.  He had his pacemaker interrogated  several weeks ago and was in sinus rhythm apparently at that time with  some atrial bigeminy.  The patient continues to feel worse.  He is here  today for evaluation.  He on interrogation of his pacemaker is clearly  in atrial fibrillation with a ventricular rate at rest of 90 beats per  minute and I suspect with any exertion goes up much faster than this  despite his 200 mg a day of Toprol.  His other medications include fish  oil, Zetia, Plavix, aspirin, hydrochlorothiazide and Nexium.  He is also  on Lipitor.   PHYSICAL EXAMINATION:  GENERAL:  On physical examination today, he is a  pleasant 76 year old man in no acute distress.  VITAL SIGNS:  The blood pressure was 122/73, the pulse was 90 and  irregularly irregular, the respirations were 18.  NECK:  No jugular venous distention.  LUNGS:  Clear bilateral to masculation.  No wheezes, rales or rhonchi  were present.  CARDIOVASCULAR:  An irregularly irregular rhythm with normal S1 and S2.  EXTREMITIES:  No edema.   Interrogation of his CardioNet monitor demonstrates atrial fibrillation  as well.   IMPRESSION:  1. Paroxysmal atrial fibrillation.  2. Coronary artery disease, status post stenting.  3. Diabetes.  4. Dyslipidemia.  5. Prior remote stroke.   DISCUSSION:  I have discussed treatment options in significant detail  with Dr. [name].  The first issue is thromboembolic prevention.  He  clearly has an  indication for Coumadin based on his prior stroke, his  history of hypertension, and his history of diabetes, and I have started  him today on Coumadin.  I have also given him a prescription of Lovenox  as I am concerned about acute thrombolic complications, though he has no  symptoms at present.  He has been given a 3-day supply of Lovenox for  this.  We will have him be seen back in our Coumadin clinic on Thursday  or Friday.  We will stop his Lovenox once his INR is therapeutic.   The second issue is that of maintenance of sinus rhythm.  He is clearly  symptomatic in atrial fibrillation and feels poorly.  I have recommended  that he for now continue his present medical therapy.  The real issue is  whether to have him come in for Tikosyn or whether amiodarone as an  outpatient would be more reasonable.  I will discuss all of this with  Dr. [name] and Dr. Graciela Husbands.  We may ultimately have him come in for a TEE-guided cardioversion with  Tikosyn initiation later  in the week.     Doylene Canning. [name], MD  Electronically Signed    GWT/MedQ  DD: 06/06/2006  DT: 06/07/2006  Job #: [id number]   cc:   Everardo Beals. [name], MD, [facility]  [name], MD, [facility]

## 2010-07-17 NOTE — Discharge Summary (Signed)
[name]                        ACCOUNT NO.:  [id number]   MEDICAL RECORD NO.:  [id number]                   PATIENT TYPE:  OIB   LOCATION:  2920                                 FACILITY:  [facility]   PHYSICIAN:  [name], P.A. 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  He was recommended cardiac catheterization.  It was arranged from June 25.  According to the patient, he has had anterior chest squeezing sensation  radiating into his throat and left arm for a long time.  He feels it has  been worse over the last several months with decreased provocation and  increased dyspnea on exertion.  When he actually gets the discomfort, he  does not have any associated shortness of breath, nausea, vomiting or  diaphoresis.  He has not had any rest or nocturnal episodes. The discomfort  is precipitated by exertion, relieved by rest, or he works his way through  it.  He has not used any nitroglycerin.  Occasionally he takes an Imdur  prior to any heavy exercise.   PAST MEDICAL HISTORY:  Notable for irritable bowel syndrome, hyperlipidemia,  hypertension, chronically-elevated sed rate, arthritis, CVA in 1990 without  residual, vertigo, status post pacemaker.  However, he is unclear as to what  site as he does not carry his card with him.  He has also had cardiac  catheterizations in the past with nonobstructive coronary artery disease.  The actual records are not available at the time of this dictation.   LABORATORY DATA:  Admission H&H is 12.5 and 35.8, normal indices.  Platelets  226, WBC's 6.9.  Subsequent hematologies are all unremarkable.  PT 14.1,  sodium 138, potassium 3.8, BUN 19, creatinine 0.9,  glucose 111.   Subsequent  chemistries are unremarkable.  Fasting lipids showed a total cholesterol of  137, triglycerides  109, HDL 61, LDL 54.  Post procedure CK was 122, and MB  is 3.1.   Chest x-ray was not performed.   EKG showed ventricular AV pacing.   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  [name] on June 25.  According to Dr. [name] progress note, he has an  ejection fraction of 60%.  He has a proximal and mid 30% RCA region  irregularities in the distal RCA.  He has a 50% mid LAD, 70% branch off the  circumflex.  He has a 70-80% mid circumflex and a 30-40% distal circumflex.  Dr. [name] after review performed Taxus stenting to the 70-80% lesion in the  mid-circumflex and a 30-40% lesion in the circumflex reducing the __________  to 0%  His sheath were removed.  Bed rest.  He was ambulating without  difficulty.  After review on June 26, by [name] it was felt that he  could be discharged home.   DISCHARGE DIAGNOSES:  1. Unstable angina, status post stenting of the mid circumflex as previously     described.  2. History of __________ as described.   DISPOSITION:  1. Discharge home.  2. He was given a new prescription for Plavix 75 mg every day x6 months.  3. He is asked to continue his home medications.  These include Toprol-XL     150 every day, Altace 10 every day, Norvasc 5 daily, Flomax unknown     dosage every day, Lipitor 80 mg q.h.s.  Zetia 10 mg every day, enteric-     coated aspirin 325 daily.  Ambien 5 mg q.h.s.  Nexium 40 mg daily.  Fish     oil daily.  Multivitamin daily.  P.r.n. lorazepam, Darvocet and     nitroglycerin.  4. He was advised no lifting, driving, sexual activity or heavy exertion for     two days. Maintain low salt, low fat cholesterol diet.  If he has any     problems with his cardiac catheterization site, he is asked to call us     immediately.  Dr. [name] gave [place].  He  was  asked to call our office on Monday to arrange a two-week followup  appointment with Dr. Annia Belt.  At the time of followup, review of cardiac  risk factor modification should be pursued.                                               [name], P.A. [facility]    EW/MEDQ  D:  08/25/2002  T:  08/25/2002  Job:  [id number]

## 2010-07-17 NOTE — Assessment & Plan Note (Signed)
[facility]                            CARDIOLOGY OFFICE NOTE   NAME:[name], [name]                     MRN:          [id number]  DATE:06/09/2006                            DOB:          January 22, 1935    CLINICAL HISTORY:  [name] is 76 years old and returns for followup  management of his coronary heart disease, atrial fibrillation and  microvascular angina.  About a week ago, he developed symptoms of  palpitations and increased fatigue and was seen by Dr. Lewayne Bunting who  interrogated the pacemaker and documented he was indeed in atrial  fibrillation.  He started him on Coumadin and he was already on 200 mg  of Toprol and had fairly good rate control.  He came back today for  followup and decisions regarding further management.   He has been fairly symptomatic  with his atrial fibrillation.  He walks  outside and gets short of breath and has angina with fairly minimal  activity.  He feels his heart pounding although it does not beat very  fast.  He has timed his pulse rate and it only gets up with exercise  into the 90s.   [name] also has coronary artery disease and has had tandem overlapping,  drug-eluting stents in circumflex artery several years ago.  He has  microvascular angina and recently had a slight change in his Myoview  scan and underwent catheterization.  He had no progression of disease  and actually had some slight regression.  He does have a large septal  perforator with a high-grade stenosis which have not changed.  LV  function is good.   PAST MEDICAL HISTORY:  Significant for hyperlipidemia, aortic stenosis  with a meant aortic valve grading of 13 mmHg at catheterization.  He  also has a Guidant DVD pacemaker which had previously been programmed to  AAI mode.  He also has a hypercoagulable state.   CURRENT MEDICATIONS:  1. Flomax,  2. Zetia.  3. Lipitor.  4. Fish oil.  5. Toprol.  6. Aspirin.  7. Hydrochlorothiazide.  8.  Protonix.  His Plavix was discontinued when Coumadin was started by Dr. [name] on  April 7.   PHYSICAL EXAMINATION:  On examination, the blood pressure was 121/77,  pulse 70 and regular.  Venous pulsation was visible just after the  clavicle.  Carotid pulses were full.  Cardiac rhythm was irregular.  There was 2/6 murmur at the left sternal edge.  There is no peripheral  edema.   IMPRESSION:  1. Recent onset and persistent atrial fibrillation.  2. History of sick sinus syndrome with APC status post Guidant DVD      generator programmed to AAI.  3. Coronary artery disease status post pr;ideas of reference placement      of tandem overlapping drug-eluting stents in the circumflex artery      with mostly nonobstructive disease at last catheterization.  4. Microvascular angina.  5. Good LV function.  6. Hyperlipidemia.  7. History of hypercoagulable state.  8. History of posterior circulation TIA.   RECOMMENDATIONS:  1. It appears that the atrial  fibrillation is the major problem now      and over rides the microvascular angina.  I have done a lot of      research to see if we could use Renexa with his right bundle branch      block and prolonged QT and this does not appear to be a problem.      However in view of his atrial fibrillation and getting him back in      sinus rhythm and maintaining sinus rhythm has priority.  His atrium      is quite large and since he prefers not to have a repeat      cardioversion, I think beginning Amiodarone is appropriate, and we      have started him on 400 mg once a day, realizing it will take a      couple of weeks to get him loaded.  He is quite symptomatic  so we      will schedule him for a TE guided cardioversion on Monday.  This is      scheduled with Dr. Jens Som.  He is to be seen in Coumadin clinic      and I notified them that we are starting Amiodarone and they will      make the appropriate adjustments in his Coumadin dosage to try  to      have him therapeutic on Monday.  We also reprogrammed his      pacemaker.  I thought since he was in atrial fibrillation that we      should have a backup ventricular mode so we programmed him to DDI      at a rate of 50 with a long AV delay.  As long as he is in atrial      fibrillation and as long as his rates are not too slow, he will be      in backup mode.  When we convert him to sinus rhythm, I would      anticipate that he would be in atrial pace mode and we may want to      increase his rate back up to 60 at that time.  I will plan to see      him back in followup in 2 to 3 weeks.  We also turned the rate      smoothing off so he will minimize any ventricular pacing since he      is quite sensitive to that.     Bruce Elvera Lennox [name], MD, [facility]  Electronically Signed    BRB/MedQ  DD: 06/09/2006  DT: 06/09/2006  Job #: [id number]   cc:   [name], MD, [facility]  Doylene Canning. [name], MD

## 2010-07-17 NOTE — Assessment & Plan Note (Signed)
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  His  ectopy has been relatively active after having been quiescent for a number  of months.  He remains on his high-dose Toprol 200 mg a day.   He is about to embark on a couple of long sojourns.   I suggested that maybe a low dose of Ativan when his ectopy is more active  might be helpful.   Will plan to see him when he gets back and consider the adjunctive use of a  low-dose calcium blocker, specifically, verapamil or diltiazem, as an  alternative to his Norvasc and see if that does not help, also.                                   [name], MD, [facility]   SCK/MedQ  DD:  09/20/2005  DT:  09/20/2005  Job #:  [id number]   cc:   Kerry Kass, MD

## 2010-07-17 NOTE — H&P (Signed)
NAME:  [name], [name]                        ACCOUNT NO.:  [id number]   MEDICAL RECORD NO.:  [id number]                   PATIENT TYPE:  INP   LOCATION:  3710                                 FACILITY:  [facility]   PHYSICIAN:  Arturo Morton. Riley Kill, M.D.             DATE OF BIRTH:  December 03, 1934   DATE OF ADMISSION:  05/10/2003  DATE OF DISCHARGE:                                HISTORY & PHYSICAL   CHIEF COMPLAINT:  Chest pain.   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  He presents with episodes of recurring  substernal chest pain.  He was skiing during the week earlier and had no  problems.  He returned to [facility] early Tuesday morning.  On Wednesday  evening while walking the dog he had an episode of substernal chest pain  that recurred about an hour later.  It lasted up to an hour and was relieved  with sublingual nitroglycerin.  He had a second episode on Thursday evening  and according to his brother has had a couple of recurrent episodes since.  He called me today in the office to discuss this; and, I later spoke with  his brother, Dr. Glennon Hamilton, and the decision was made to admit the patient  for further evaluation and treatment.  Cardiac catheterization is planned on  Monday.   PAST MEDICAL HISTORY:  1. The patient has had known microvascular angina for many years.  2. The patient had a left heart catheterization in June 2004; left main was     free of significant disease.  There was a 50% mid LAD stenosis.  The     circumflex had 70% sub-branch of the OM and then tandem 70, 80 and 40%     mid-to-distal circumflex with subsequent stenting.  There was a 30%     proximal and mid right coronary with an ejection fraction of 60%.  3. The patient has had chronic bradycardia requiring permanent transvenous     pacing by Dr. Graciela Husbands.  4. The patient has had a history of paroxysmal supraventricular tachycardia.  5. The  patient has benign prostatic hypertrophy.  6. Hypertension.  7. History of CVA with vertebral artery occlusion.  8. Osteoarthritis.  9. Prior hemorrhoidectomy.  10.      Probable hypercoagulable syndrome.   ALLERGIES:  No known drug allergies.   MEDICATIONS:  1. Plavix 75 mg daily.  2. Flomax 0.4 mg q.h.s.  3. Toprol XL 50 in the morning and 100 mg in the evening.  4. Norvasc 5 mg daily.  5. Zetia 10 mg daily.  6. Lipitor 40 mg q.h.s.  7. Altace 10 mg daily.  8. Enteric coated aspirin 81 mg daily.  9. Aciphex 20 mg p.r.n.  10.      Fish oil caps 1 gram b.i.d.  11.  Nitroglycerin 0.4 mg sublingual p.r.n.   FAMILY HISTORY:  Mother died in her early 34s; she had coronary artery  disease and a pacemaker.  Father died in his 58s and he had coronary artery  disease as well as diabetes mellitus; both had cerebrovascular disease.  Brother died of sarcoid of the heart in his late 69s.   SOCIAL HISTORY:  The patient is a Paediatric nurse.  He has three  children and is married.  He does not smoke.  He drinks two glasses of wine  per week.   REVIEW OF SYSTEMS:  Review of systems is negative for significant rashes.  He has obvious osteoarthritis, which he has had for some time.  He has had  an occasional episode of vertigo.  Review of systems is otherwise negative.   PHYSICAL EXAMINATION:  GENERAL APPEARANCE:  On examination the patient is an  alert and oriented male in no acute distress.  VITAL SIGNS:  Blood pressure 170/100 in the right arm and 170/100 in the  left arm.  Respiratory rate 18.  Pulse 70 and temperature is pending.  NECK:  The neck is supple.  There are transmitted bruits from the aortic  area with good upstrokes.  LUNGS:  The lungs are clear to auscultation and percussion.  HEART:  The cardiac rhythm is regular.  There is a systolic ejection murmur,  graded 2/6, and without any diastolic murmurs noted.  There are no clicks or  rubs.  ABDOMEN:  The abdomen is  soft without obvious hepatosplenomegaly.  EXTREMITIES:  There is no extremity edema.  Distal pulses are intact.  NEUROLOGIC EXAMINATION:  Cranial nerves grossly are intact.  The remainder  of the neurologic examination is not performed.   LABORATORY DATA:  EKG is pending.   IMPRESSION:  1. Recurrent episodes of substernal chest pain with prior implantation of     TAXUS drug-eluting stents, June 2004.  2. History of microvascular angina.  3. Status post DDD pacer for sick sinus syndrome.  4. Probable hypercoagulable syndrome.  5. Hyperlipidemia.  6. Benign prostatic hypertrophy.  7. Hypertension.  8. Cerebrovascular accident with vertebral artery occlusion.   PLAN:  1. Admit to hospital.  2. Intravenous heparin drip.  3. Probable cardiac catheterization on Monday morning.   The patient and his family are all agreeable to this plan.                                                Arturo Morton. Riley Kill, M.D.    TDS/MEDQ  D:  05/10/2003  T:  05/11/2003  Job:  [id number]   cc:   [name], M.D.   [name], M.D.   Charlies Constable, M.D.   Cecil Cranker, M.D.   [facility]

## 2010-07-17 NOTE — Discharge Summary (Signed)
NAME:  [name], [name]                        ACCOUNT NO.:  [id number]   MEDICAL RECORD NO.:  [id number]                   PATIENT TYPE:  INP   LOCATION:  3710                                 FACILITY:  [facility]   PHYSICIAN:  Charlies Constable, M.D. [facility]              DATE OF BIRTH:  1935/02/05   DATE OF ADMISSION:  05/10/2003  DATE OF DISCHARGE:  05/13/2003                           DISCHARGE SUMMARY - REFERRING   DISCHARGE DIAGNOSES:  1. Nonobstructive coronary artery disease.  2. Chest pain, resolved.  3. Aortic sclerosis.  4. Sick sinus syndrome, status post DDD permanent pacemaker.  5. History of positive antinuclear antibody.  6. Arthritis.  7. Prior transient ischemic attack.   FAMILY HISTORY:  Coronary artery disease.   ALLERGIES:  No known drug allergies.   PAST MEDICAL HISTORY:  Hypertension.   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  He has undergone percutaneous  coronary intervention in 2004.  He was admitted on May 10, 2003 with  recurrent chest pain while walking.   He ultimately underwent cardiac catheterization on May 13, 2003.  He was  found to have the following:  LAD with a 40% proximal, 50% mid-lesion.  There was a 95% septal perforator lesion.  Circumflex was a 40% ramus and  there was a 0% in-stent restenosis at the previous stent site of the  circumflex.  The right coronary artery has a 30% proximal lesion.  Left  ventriculogram reveals an EF of 55%.  Dr. [name] at this time felt the  patient had nonobstructive coronary artery disease and felt that this could  perhaps still be ischemic related secondary to microvascular angina.   Lower extremity dopplers revealed no evidence of DVT.  EKG revealed right  bundle branch block, rate 76.  Chest x-ray had no acute findings.  Potassium  4.0, BUN 14, creatinine 1.1.  D-dimer was 0.29.  Hemoglobin 11.6, hematocrit  34.9, platelets  230,000.  White count 6.8.  Total cholesterol 148,  triglycerides 116, HDL 54, LDL 71.   CONDITION ON DISCHARGE:  The patient was discharged home on May 13, 2003  in stable condition.   DISCHARGE MEDICATIONS:  1. Toprol 50 mg b.i.d.  2. Norvasc 10 mg q.d.  3. Aspirin 325 mg q.d.  4. Imdur 15 mg q.d.  5. He is to resume his Flomax, Lipitor, Prilosec, Altace, Aciphex, Zetia,     fish oil, and nitroglycerin as needed as prior to admission.  6. May utilize Tylenol for pain.   DISCHARGE INSTRUCTIONS:  No strenuous activity, lifting.  No driving for two  days.  He will gradually increase activity.  Low fat diet.  Clean the site  with soap and water.  No scrubbing.  Follow up with questions or concerns.  The patient has a follow-up Adenosine-Cardiolite on May 17, 2003 and is to  hold the Imdur  that morning.  Follow up with Dr. [name] on June 03, 2003 at 2:30 p.m.      Guy Franco, P.A. [facility]                      Charlies Constable, M.D. [facility]    LB/MEDQ  D:  06/14/2003  T:  06/14/2003  Job:  [id number]   cc:   [name], M.D. [facility]   Charlies Constable, M.D. [facility]   [name], M.D.

## 2010-07-17 NOTE — Assessment & Plan Note (Signed)
[facility]                            CARDIOLOGY OFFICE NOTE   [name], ZURAWSKI                     MRN:          [id number]  DATE:01/28/2006                            DOB:          01/14/1935    PRIMARY CARE PHYSICIAN:  None.   ELECTROPHYSIOLOGIST:  [name], MD, [facility].   CLINICAL HISTORY:  [name] is 76 years old and has microvascular angina and  had two overlapping TAXUS stents placed in the circumflex artery in  2004.  He developed increased chest pain in 2005, and was studied and  was found to have nonobstructive disease in the LAD but a high grade  lesion in the septal perforator.  He had IVUS done to document the  lesion.  LAD was not obstructed.  He had been having stable angina since  that time and had to take nitroglycerin a fair amount until about 2  months ago when his symptoms improved.  He found that he only has to  take a rare nitroglycerin now.  All of his pain is related to exertion  and he walks on a treadmill regularly as well as walks his dogs.  He  also works out with [employer].  He has this equipment at home.   [name] also has had problems with sick sinus syndrome and atrial  tachycardia and bradycardia and has a __________  pacemaker which is  programmed AAI.  He says he occasionally gets episodes where he feels  like his heart goes up to 120.  He is not sure this is PAT because it  does not always start suddenly.  We did document an episode of PAT on  interrogation of his pacemaker today.   PAST MEDICAL HISTORY:  1. Hyperlipidemia.  2. Positive ANA.  3. Aortic stenosis with a mean aortic valve gradient of 13 in 2006,      with some concentric LVH.   CURRENT MEDICATIONS:  Flomax, Zetia, fish oil, multivitamin, Plavix,  Toprol, aspirin, Lipitor, hydrochlorothiazide, and Nexium.   PHYSICAL EXAMINATION:  VITAL SIGNS:  The blood pressure was 120/72 and  the pulse 62 and regular.  NECK:  There is no venous  distention.  The carotid pulses were full  transmitted bruits.  CHEST:  Clear without rales or rhonchi.  HEART:  Rhythm is regular.  Heart sounds were normal.  There is a grade  2/6 systolic ejection murmur left sternal edge radiating to the base and  to the neck.  I can hear no diastolic murmur.  ABDOMEN:  Soft without organomegaly.  EXTREMITIES:  Peripheral pulses were full.  There is no peripheral  edema.   His recent lipid profile showed an HDL of 59, an LDL of 61, and a total  cholesterol of 147, according to Dr. Nira Retort.   IMPRESSION:  1. Coronary artery disease, status post prior placement of tandem      overlapping TAXUS stents in the circumflex artery with high grade      large septal perforator lesion by last catheterization in 2005, now      stable on medical therapy.  2. Microvascular  angina.  3. Sick sinus syndrome with supraventricular tachycardia, stable.  4. Status post Guidant Pulsar Max II __________  generator programmed      to AAI mode.  5. Treated hyperlipidemia.   RECOMMENDATIONS:  I think [name] is doing quite well.  His symptoms have  improved for reasons that are not clear and it is possible he may have  developed some collaterals to the septal perforator.  He is not certain  he can tell a difference between microvascular angina and his other type  of angina.  In either event, he is much better.   His pacemaker showed good threshold and AAI pacing all the time and he  was pacer dependent.  His rate response was very good and we did  document one episode of PAT.   1. We will leave his medicines the same.  2. We will repeat an echocardiogram to rule out aortic stenosis as it      has been more than a year.  3. I will plan to see him back in followup in 6 months.     Bruce Elvera Lennox [name], MD, [facility]  Electronically Signed    BRB/MedQ  DD: 01/28/2006  DT: 01/29/2006  Job #: [id number]

## 2010-08-18 ENCOUNTER — Other Ambulatory Visit: Payer: Self-pay

## 2010-08-18 ENCOUNTER — Ambulatory Visit (INDEPENDENT_AMBULATORY_CARE_PROVIDER_SITE_OTHER): Payer: Medicare Other | Admitting: Internal Medicine

## 2010-08-18 DIAGNOSIS — I495 Sick sinus syndrome: Secondary | ICD-10-CM

## 2010-08-18 DIAGNOSIS — R079 Chest pain, unspecified: Secondary | ICD-10-CM

## 2010-08-18 DIAGNOSIS — I4892 Unspecified atrial flutter: Secondary | ICD-10-CM

## 2010-08-18 DIAGNOSIS — I4891 Unspecified atrial fibrillation: Secondary | ICD-10-CM

## 2010-08-18 NOTE — Patient Instructions (Signed)
Your physician recommends that you return for lab work on Friday 08/28/10 ---[name]  Depending on labs will set up for DCCV for next week on Mon or Tues

## 2010-08-19 ENCOUNTER — Encounter: Payer: Self-pay | Admitting: Internal Medicine

## 2010-08-19 DIAGNOSIS — I4892 Unspecified atrial flutter: Secondary | ICD-10-CM | POA: Insufficient documentation

## 2010-08-19 NOTE — Assessment & Plan Note (Signed)
This is a new problem. I discussed the treatment options with the patient. I recommended that we continue to hold his thyroid replacement and allow his thyroid to become euthyroid. Once this is accomplished, we plan to proceed with DC cardioversion. If the patient develops recurrent flutter after this, then catheter ablation would be recommended. Hopefully with maintenance of his thyroid in the normal state, he will maintain sinus rhythm. We'll schedule DC cardioversion in the next week.

## 2010-08-19 NOTE — Assessment & Plan Note (Signed)
His symptoms are well controlled. He will continue amiodarone.

## 2010-08-19 NOTE — Assessment & Plan Note (Signed)
His symptoms have worsened with development of atrial flutter. This is secondary to his more rapid ventricular response. He will continue with his beta blocker in addition to amiodarone and use nitroglycerin if needed.

## 2010-08-19 NOTE — Progress Notes (Signed)
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 The patient is a very pleasant 76 year old man with a history of hypertension, aortic valve stenosis, atrial fibrillation, symptomatic bradycardia, status post permanent pacemaker insertion. Several days ago he was in the [place] area when he developed palpitations and weak weakness. Subsequent evaluation there demonstrated atrial flutter with a controlled ventricular response. The patient initially felt worsening shortness of breath and chest pressure. He has had his medications up titrated to control his ventricular response. He has not had syncope. The patient has had problems with an elevated thyroid function. Previously he had been on Synthroid. He had held this medication. He is on chronic Coumadin therapy and his levels have been therapeutic. No Known Allergies   Current Outpatient Prescriptions  Medication Sig Dispense Refill  . amiodarone (PACERONE) 200 MG tablet 400 mg. 2 tabs daily      . aspirin 81 MG tablet Take 81 mg by mouth daily.        Marland Kitchen co-enzyme Q-10 30 MG capsule Take 30 mg by mouth daily.        . hydrochlorothiazide 25 MG tablet Take 25 mg by mouth daily.       Marland Kitchen levothyroxine (SYNTHROID, LEVOTHROID) 75 MCG tablet Take 75 mcg by mouth daily.        . metoprolol succinate (TOPROL-XL) 25 MG 24 hr tablet Take 50 mg by mouth daily.       . potassium chloride (K-DUR,KLOR-CON) 10 MEQ tablet Take 10 mEq by mouth 2 (two) times daily.        . rosuvastatin (CRESTOR) 40 MG tablet Take 20 mg by mouth daily.       . Silodosin (RAPAFLO PO) Take by mouth daily.        Marland Kitchen warfarin (COUMADIN) 4 MG tablet Take by mouth as directed.           Past Medical History  Diagnosis Date  . Paroxysmal atrial fibrillation   . Chronotropic incompetence with sinus node dysfunction     Status post Guidant dual-mode, dual-pacing, dual-sensing  pacemaker   implantation now programmed to AAI with recent generator  change.  . Coronary artery disease     status post multiple prior percutaneous coronary interventions, microvascular angina per Dr [name]  . Aortic stenosis     moderate aortic stenosis  . Hyperlipidemia   . Hypercoagulable state     chronically anticoagulated with coumadin    ROS:   All systems reviewed and negative except as noted in the HPI.   Past Surgical History  Procedure Date  . Pacemaker insertion 1991    Guidant PPM, most recent Generator Change by Dr [name] was 08/22/06     No family history on file.   History   Social History  . Marital Status: Married    Spouse Name: N/A    Number of Children: N/A  . Years of Education: N/A   Occupational History  . Not on file.   Social History Main Topics  . Smoking status: Never Smoker   . Smokeless tobacco: Not on file  . Alcohol Use: No  . Drug Use: No  . Sexually Active: Not on file   Other Topics Concern  . Not on file   Social History Narrative  . No narrative on file     There were no vitals taken for this visit.  Physical Exam:  Well appearing NAD HEENT: Unremarkable Neck:  No JVD, no thyromegally  Lymphatics:  No adenopathy Back:  No CVA tenderness Lungs:  Clear HEART:  Regular rate rhythm, no murmurs, no rubs, no clicks Abd:  positive bowel sounds, no organomegally, no rebound, no guarding Ext:  2 plus pulses, no edema, no cyanosis, no clubbing Skin:  No rashes no nodules Neuro:  CN II through XII intact, motor grossly intact  EKG Atrial flutter with a controlled ventricular response. DEVICE  Normal device function.  See PaceArt for details.   Assess/Plan:

## 2010-08-21 ENCOUNTER — Other Ambulatory Visit (INDEPENDENT_AMBULATORY_CARE_PROVIDER_SITE_OTHER): Payer: Medicare Other | Admitting: *Deleted

## 2010-08-21 ENCOUNTER — Other Ambulatory Visit: Payer: Medicare Other

## 2010-08-21 DIAGNOSIS — I4892 Unspecified atrial flutter: Secondary | ICD-10-CM

## 2010-08-21 DIAGNOSIS — I4891 Unspecified atrial fibrillation: Secondary | ICD-10-CM

## 2010-08-21 LAB — T4, FREE: Free T4: 1.2 ng/dL (ref 0.60–1.60)

## 2010-08-21 LAB — T4: T4, Total: 5.7 ug/dL (ref 5.0–12.5)

## 2010-08-26 ENCOUNTER — Ambulatory Visit (HOSPITAL_COMMUNITY)
Admission: RE | Admit: 2010-08-26 | Discharge: 2010-08-26 | Disposition: A | Discharge status: Home / Self Care | Payer: Medicare Other | Source: Ambulatory Visit | Attending: Internal Medicine | Admitting: Internal Medicine

## 2010-08-26 DIAGNOSIS — Z79899 Other long term (current) drug therapy: Secondary | ICD-10-CM | POA: Insufficient documentation

## 2010-08-26 DIAGNOSIS — Z01818 Encounter for other preprocedural examination: Secondary | ICD-10-CM | POA: Insufficient documentation

## 2010-08-26 DIAGNOSIS — Z01812 Encounter for preprocedural laboratory examination: Secondary | ICD-10-CM | POA: Insufficient documentation

## 2010-08-26 DIAGNOSIS — I4892 Unspecified atrial flutter: Secondary | ICD-10-CM

## 2010-08-26 LAB — BASIC METABOLIC PANEL
CO2: 27 mEq/L (ref 19–32)
Calcium: 8.8 mg/dL (ref 8.4–10.5)
Creatinine, Ser: 0.85 mg/dL (ref 0.50–1.35)
GFR calc Af Amer: >60 mL/min (ref >60)
GFR calc non Af Amer: >60 mL/min (ref >60)
Sodium: 138 mEq/L (ref 135–145)

## 2010-08-26 LAB — CBC
MCV: 83.1 fL (ref 78.0–100.0)
Platelets: 223 10*3/uL (ref 150–400)
RBC: 4.27 MIL/uL (ref 4.22–5.81)
WBC: 6.5 10*3/uL (ref 4.0–10.5)

## 2010-08-26 LAB — APTT: aPTT: 35 seconds (ref 24–37)

## 2010-08-26 LAB — PROTIME-INR
INR: 2.24 — ABNORMAL HIGH (ref 0.00–1.49)
Prothrombin Time: 25.2 seconds — ABNORMAL HIGH (ref 11.6–15.2)

## 2010-08-28 ENCOUNTER — Other Ambulatory Visit: Payer: Medicare Other | Admitting: *Deleted

## 2010-09-08 ENCOUNTER — Other Ambulatory Visit: Payer: Self-pay | Admitting: Dermatology

## 2010-09-17 NOTE — Op Note (Signed)
  NAMEXAYVIER, [name] NO.:  [id number]  MEDICAL RECORD NO.:  [id number]  LOCATION:  [facility]                         FACILITY:  [facility]  PHYSICIAN:  Doylene Canning. [name], MD    DATE OF BIRTH:  08-08-1934  DATE OF PROCEDURE:  08/26/2010 DATE OF DISCHARGE:  08/26/2010                              OPERATIVE REPORT   PROCEDURE PERFORMED:  Direct current cardioversion.  SURGEON:  Doylene Canning. [name], MD  INDICATIONS:  Symptomatic atrial flutter.  INTRODUCTION:  The patient is a 76 year old man who developed persistent atrial flutter despite low-dose amiodarone therapy after he became hyperthyroid.  He has now reached a euthyroid state.  He has been therapeutically anticoagulated.  He is referred for DC cardioversion.  PROCEDURE IN DETAIL:  After informed consent was obtained, the patient was prepped in the usual manner.  The electrode dispersive pad was placed in the anterior-posterior position.  125 mg of propofol was delivered intravenously under the direction of Dr. Michelle Piper.  200 joules of synchronized biphasic energy was subsequently delivered by way of the electrode dispersive pad in the anterior-posterior position resulting in restoration of sinus rhythm.  Atrial pacing then commenced.  The patient tolerated the procedure well.  COMPLICATIONS:  There were no immediate procedure complications.  RESULTS:  This demonstrated successful DC cardioversion in a patient with atrial flutter.     Doylene Canning. [name], MD     GWT/MEDQ  D:  08/26/2010  T:  08/27/2010  Job:  [id number]  Electronically Signed by Lewayne Bunting MD on 09/17/2010 08:35:08 AM

## 2010-09-23 ENCOUNTER — Ambulatory Visit (INDEPENDENT_AMBULATORY_CARE_PROVIDER_SITE_OTHER): Payer: Medicare Other | Admitting: Internal Medicine

## 2010-09-23 DIAGNOSIS — I4891 Unspecified atrial fibrillation: Secondary | ICD-10-CM

## 2010-11-16 ENCOUNTER — Other Ambulatory Visit: Payer: Self-pay | Admitting: Nurse Practitioner

## 2010-11-16 ENCOUNTER — Other Ambulatory Visit (HOSPITAL_COMMUNITY): Payer: Medicare Other | Admitting: Radiology

## 2010-11-16 ENCOUNTER — Ambulatory Visit (HOSPITAL_COMMUNITY): Payer: Medicare Other | Attending: Cardiology | Admitting: Radiology

## 2010-11-16 ENCOUNTER — Ambulatory Visit (INDEPENDENT_AMBULATORY_CARE_PROVIDER_SITE_OTHER): Payer: Medicare Other | Admitting: *Deleted

## 2010-11-16 ENCOUNTER — Other Ambulatory Visit (HOSPITAL_COMMUNITY): Payer: Self-pay | Admitting: Radiology

## 2010-11-16 DIAGNOSIS — I2510 Atherosclerotic heart disease of native coronary artery without angina pectoris: Secondary | ICD-10-CM

## 2010-11-16 DIAGNOSIS — I079 Rheumatic tricuspid valve disease, unspecified: Secondary | ICD-10-CM | POA: Insufficient documentation

## 2010-11-16 DIAGNOSIS — I2589 Other forms of chronic ischemic heart disease: Secondary | ICD-10-CM

## 2010-11-16 DIAGNOSIS — E7800 Pure hypercholesterolemia, unspecified: Secondary | ICD-10-CM

## 2010-11-16 DIAGNOSIS — I519 Heart disease, unspecified: Secondary | ICD-10-CM

## 2010-11-16 DIAGNOSIS — J331 Polypoid sinus degeneration: Secondary | ICD-10-CM

## 2010-11-16 DIAGNOSIS — N400 Enlarged prostate without lower urinary tract symptoms: Secondary | ICD-10-CM

## 2010-11-16 DIAGNOSIS — D6859 Other primary thrombophilia: Secondary | ICD-10-CM

## 2010-11-16 DIAGNOSIS — N181 Chronic kidney disease, stage 1: Secondary | ICD-10-CM

## 2010-11-16 DIAGNOSIS — I4891 Unspecified atrial fibrillation: Secondary | ICD-10-CM

## 2010-11-16 DIAGNOSIS — I359 Nonrheumatic aortic valve disorder, unspecified: Secondary | ICD-10-CM

## 2010-11-16 DIAGNOSIS — R079 Chest pain, unspecified: Secondary | ICD-10-CM

## 2010-11-16 DIAGNOSIS — I10 Essential (primary) hypertension: Secondary | ICD-10-CM | POA: Insufficient documentation

## 2010-11-16 DIAGNOSIS — R7301 Impaired fasting glucose: Secondary | ICD-10-CM

## 2010-11-16 DIAGNOSIS — E119 Type 2 diabetes mellitus without complications: Secondary | ICD-10-CM

## 2010-11-16 DIAGNOSIS — I350 Nonrheumatic aortic (valve) stenosis: Secondary | ICD-10-CM

## 2010-11-16 DIAGNOSIS — R351 Nocturia: Secondary | ICD-10-CM

## 2010-11-16 DIAGNOSIS — Z79899 Other long term (current) drug therapy: Secondary | ICD-10-CM

## 2010-11-16 DIAGNOSIS — I379 Nonrheumatic pulmonary valve disorder, unspecified: Secondary | ICD-10-CM | POA: Insufficient documentation

## 2010-11-16 DIAGNOSIS — I080 Rheumatic disorders of both mitral and aortic valves: Secondary | ICD-10-CM | POA: Insufficient documentation

## 2010-11-16 DIAGNOSIS — Z7902 Long term (current) use of antithrombotics/antiplatelets: Secondary | ICD-10-CM

## 2010-11-16 DIAGNOSIS — N320 Bladder-neck obstruction: Secondary | ICD-10-CM

## 2010-11-16 DIAGNOSIS — I4892 Unspecified atrial flutter: Secondary | ICD-10-CM

## 2010-11-16 DIAGNOSIS — I209 Angina pectoris, unspecified: Secondary | ICD-10-CM

## 2010-11-16 DIAGNOSIS — R9431 Abnormal electrocardiogram [ECG] [EKG]: Secondary | ICD-10-CM

## 2010-11-16 DIAGNOSIS — E785 Hyperlipidemia, unspecified: Secondary | ICD-10-CM

## 2010-11-16 DIAGNOSIS — R300 Dysuria: Secondary | ICD-10-CM

## 2010-11-16 DIAGNOSIS — M1990 Unspecified osteoarthritis, unspecified site: Secondary | ICD-10-CM

## 2010-11-16 DIAGNOSIS — I495 Sick sinus syndrome: Secondary | ICD-10-CM

## 2010-11-16 DIAGNOSIS — D649 Anemia, unspecified: Secondary | ICD-10-CM

## 2010-11-16 DIAGNOSIS — J309 Allergic rhinitis, unspecified: Secondary | ICD-10-CM

## 2010-11-16 LAB — CBC WITH DIFFERENTIAL/PLATELET
Basophils Absolute: 0 10*3/uL (ref 0.0–0.1)
Basophils Relative: 0 % (ref 0–1)
Eosinophils Absolute: 0.2 10*3/uL (ref 0.0–0.7)
Eosinophils Relative: 4 % (ref 0–5)
HCT: 39.2 % (ref 39.0–52.0)
MCH: 28.9 pg (ref 26.0–34.0)
MCHC: 33.2 g/dL (ref 30.0–36.0)
MCV: 87.1 fL (ref 78.0–100.0)
Monocytes Absolute: 0.3 10*3/uL (ref 0.1–1.0)
Monocytes Relative: 6 % (ref 3–12)
RDW: 16.3 % — ABNORMAL HIGH (ref 11.5–15.5)

## 2010-11-16 LAB — LIPID PANEL
Cholesterol: 239 mg/dL — ABNORMAL HIGH (ref 0–200)
HDL: 99.4 mg/dL (ref >39.00)
Total CHOL/HDL Ratio: 2
VLDL: 18 mg/dL (ref 0.0–40.0)

## 2010-11-16 LAB — BASIC METABOLIC PANEL WITH GFR
BUN: 20 mg/dL (ref 6–23)
CO2: 31 mEq/L (ref 19–32)
Chloride: 98 mEq/L (ref 96–112)
Creat: 1.26 mg/dL (ref 0.50–1.35)
GFR, Est Non African American: 56 mL/min — ABNORMAL LOW (ref >60)
Glucose, Bld: 100 mg/dL — ABNORMAL HIGH (ref 70–99)

## 2010-11-16 LAB — C-REACTIVE PROTEIN: CRP: 0.15 mg/dL (ref <0.60)

## 2010-11-16 LAB — T4: T4, Total: 1.6 ug/dL — ABNORMAL LOW (ref 5.0–12.5)

## 2010-11-16 LAB — LDL CHOLESTEROL, DIRECT: Direct LDL: 117.9 mg/dL

## 2010-11-17 LAB — TSH: TSH: 89.218 u[IU]/mL — ABNORMAL HIGH (ref 0.350–4.500)

## 2010-11-17 LAB — TESTOSTERONE, FREE, TOTAL, SHBG
Sex Hormone Binding: 42 nmol/L (ref 13–71)
Testosterone, Free: 30.4 pg/mL — ABNORMAL LOW (ref 47.0–244.0)
Testosterone: 186.89 ng/dL — ABNORMAL LOW (ref 250–890)

## 2010-11-19 ENCOUNTER — Ambulatory Visit (INDEPENDENT_AMBULATORY_CARE_PROVIDER_SITE_OTHER): Payer: Medicare Other | Admitting: Cardiology

## 2010-11-19 DIAGNOSIS — E039 Hypothyroidism, unspecified: Secondary | ICD-10-CM

## 2010-11-19 DIAGNOSIS — I359 Nonrheumatic aortic valve disorder, unspecified: Secondary | ICD-10-CM

## 2010-11-19 DIAGNOSIS — E785 Hyperlipidemia, unspecified: Secondary | ICD-10-CM

## 2010-11-19 DIAGNOSIS — I4891 Unspecified atrial fibrillation: Secondary | ICD-10-CM

## 2010-11-19 DIAGNOSIS — I2510 Atherosclerotic heart disease of native coronary artery without angina pectoris: Secondary | ICD-10-CM

## 2010-11-19 NOTE — Patient Instructions (Addendum)
Your physician has requested that you have an echocardiogram in [facility] 2013. Echocardiography is a painless test that uses sound waves to create images of your heart. It provides your doctor with information about the size and shape of your heart and how well your heart's chambers and valves are working. This procedure takes approximately one hour. There are no restrictions for this procedure.  Your physician wants you to follow-up in: [facility] 2013. You will receive a reminder letter in the mail two months in advance. If you don't receive a letter, please call our office to schedule the follow-up appointment.

## 2010-11-22 DIAGNOSIS — E039 Hypothyroidism, unspecified: Secondary | ICD-10-CM | POA: Insufficient documentation

## 2010-11-22 NOTE — Assessment & Plan Note (Signed)
Has AS in a background of both known CAD, which has been treated by PCI, and syndrome X, which has been documented previously.  It is notable that this is well described from prior history.  It was the same in his identical twin, Dr. [name].  There is not an aggravation in this at present.  Given his progressive aortic valve disease, I think it likely he will need surgical AVR in the next year or so.  A follow up echo in six months is suggested.  If he exceeds 4 m/s, he will be referred to Dr. Rexanne Mano.  Unlike Dr. [name], he has not had previous CABG, and I think his surgical risk score is low enough not to require a consideration of TAVR at this point.  He and I have discussed this at length, and I will bring back to him new information from TCT regarding treatment in lower risk patients, but he is comfortable with the approach.  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

## 2010-11-22 NOTE — Assessment & Plan Note (Signed)
Labs currently not reliable in background of hypothyroidism, undertreated.  Will recheck when this gets resolved.

## 2010-11-22 NOTE — Assessment & Plan Note (Signed)
Last cath in February.  Stable at present.  Would likely need another cath, but not until the time of planned surgical AVR.  He wishes to stay here for his surgery.

## 2010-11-22 NOTE — Assessment & Plan Note (Signed)
Stopped his own replacement therapy.  He is going back on at a targeted amount.  Will follow along.  Has spoken with Dr. [name].

## 2010-11-22 NOTE — Assessment & Plan Note (Signed)
Probable NSR.  Will have him get an ECG in his office.  Notably, it could recur when thyroid is normalized.  Will need to check amiodarone with time.

## 2010-11-22 NOTE — Progress Notes (Signed)
HPI:  [name] comes in for follow up.  The patient had atrial fib, and underwent cardioversion.  Of note, he was on thyroid replacement, and stopped it at the time because his TSH was low.  He has not taken, and when rechecked, he had a very high TSH.  He made this decision on his own.  He has not had noticeable symptoms since then.  He spoke with Lavone Orn, and then restarted his medications at a lower dose.  He will be rechecking his values.  Denies progressive chest pain, not clear that he is excessively fatigued.  Echo done today, and he and I reviewed the images.  He has some motion of the AV, and of note, his velocity is 3.7 m/s.  He and I discussed the results, implications, and possible outcomes of this.    Current Outpatient Prescriptions  Medication Sig Dispense Refill  . amiodarone (PACERONE) 200 MG tablet 400 mg. 2 tabs daily      . aspirin 81 MG tablet Take 81 mg by mouth daily.        Marland Kitchen co-enzyme Q-10 30 MG capsule Take 30 mg by mouth daily.        . hydrochlorothiazide 25 MG tablet Take 25 mg by mouth daily.       Marland Kitchen levothyroxine (SYNTHROID, LEVOTHROID) 75 MCG tablet Take 75 mcg by mouth daily.        . metoprolol succinate (TOPROL-XL) 25 MG 24 hr tablet Take 50 mg by mouth daily.       . potassium chloride (K-DUR,KLOR-CON) 10 MEQ tablet Take 10 mEq by mouth 2 (two) times daily.        . rosuvastatin (CRESTOR) 40 MG tablet Take 20 mg by mouth daily.       . Silodosin (RAPAFLO PO) Take by mouth daily.        Marland Kitchen warfarin (COUMADIN) 4 MG tablet Take by mouth as directed.          No Known Allergies  Past Medical History  Diagnosis Date  . Paroxysmal atrial fibrillation   . Chronotropic incompetence with sinus node dysfunction     Status post Guidant dual-mode, dual-pacing, dual-sensing  pacemaker   implantation now programmed to AAI with recent generator change.  . Coronary artery disease     status post multiple prior percutaneous coronary interventions, microvascular angina per  Dr [name]  . Aortic stenosis     moderate aortic stenosis  . Hyperlipidemia   . Hypercoagulable state     chronically anticoagulated with coumadin    Past Surgical History  Procedure Date  . Pacemaker insertion 1991    Guidant PPM, most recent Generator Change by Dr [name] was 08/22/06    No family history on file.  History   Social History  . Marital Status: Married    Spouse Name: N/A    Number of Children: N/A  . Years of Education: N/A   Occupational History  . Not on file.   Social History Main Topics  . Smoking status: Never Smoker   . Smokeless tobacco: Not on file  . Alcohol Use: No  . Drug Use: No  . Sexually Active: Not on file   Other Topics Concern  . Not on file   Social History Narrative  . No narrative on file    ROS: Please see the HPI.  All other systems reviewed and negative.  PHYSICAL EXAM:  There were no vitals taken for this visit.  General: Well developed,  well nourished, in no acute distress. Head:  Normocephalic and atraumatic. Neck: no JVD Lungs: Clear to auscultation and percussion. Heart: SEM  3-4/6 mid peaking, with preserved S2. Marland Kitchen  Extremities: No clubbing or cyanosis. No edema. Neurologic: Alert and oriented x 3.  EKG:  ECHO:Study Conclusions    - Left ventricle: The cavity size was normal. Systolic     function was normal. The estimated ejection fraction was     in the range of 55% to 60%. Wall motion was normal; there     were no regional wall motion abnormalities. Doppler     parameters are consistent with abnormal left ventricular     relaxation (grade 1 diastolic dysfunction).   - Aortic valve: Valve mobility was restricted. There was     moderate stenosis. Mild regurgitation. Valve area:     0.73cm^2 (Vmax).   - Mitral valve: Calcified annulus. Mild regurgitation.   - Left atrium: The atrium was moderately dilated.  Aortic valve                                  Peak vel,  374 cm/s     -----                                   S                                  Mean vel,  216 cm/s     -----  Apr 14, 2010  Cardiac Cath Data:   LAD was a long vessel wrapping the apex.  It gave off several small   diagonals.  In the very proximal LAD there was a 40% tubular stenosis.   In mid LAD there was a tubular 30% stenosis spanning the septal   perforator.  The septal perforator had a chronic 99% subtotal occlusion   ostially.  This was completely stable.      Left circumflex gave off a large branching ramus and a posterolateral.   In the mid circumflex, there was a long area of stenting.  Prior to the   stent, there was about 30% stenosis.  The stent was itself was widely   patent.      Right coronary artery was a dominant vessel gave off an RV branch of PDA   and a posterolateral.  There is diffuse 40% plaquing in the proximal   section and tubular 50% stenosis in the midsection and 20-30% stenosis   distally near the takeoff of the PDA.  This was chronic.      Left ventriculogram done in the RAO position showed an EF of 55% with no   regional wall motion abnormalities.      ASSESSMENT:   1. Stable essentially nonobstructive coronary artery disease.   2. Moderate aortic stenosis with gradient very similar to what is       found on echocardiogram.   3. Aortic root dilatation.   4. Normal left ventricular function.      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  We will continue aggressive risk factor management.  He has   had some progression in his aortic stenosis over the past few years.  We   will continue to  watch this.  We will also continue to follow his aortic   root diameter.        ASSESSMENT AND PLAN:

## 2010-12-16 LAB — BASIC METABOLIC PANEL
BUN: 21
CO2: 30
Calcium: 9.1
GFR calc non Af Amer: 56 — ABNORMAL LOW
Glucose, Bld: 115 — ABNORMAL HIGH

## 2010-12-16 LAB — CBC
HCT: 37.7 — ABNORMAL LOW
Hemoglobin: 12.9 — ABNORMAL LOW
MCHC: 34.2
Platelets: 207
RDW: 14.2 — ABNORMAL HIGH

## 2010-12-16 LAB — APTT: aPTT: 27

## 2011-01-28 ENCOUNTER — Other Ambulatory Visit: Payer: Self-pay

## 2011-01-28 DIAGNOSIS — I350 Nonrheumatic aortic (valve) stenosis: Secondary | ICD-10-CM

## 2011-01-28 DIAGNOSIS — R079 Chest pain, unspecified: Secondary | ICD-10-CM

## 2011-01-29 ENCOUNTER — Telehealth: Payer: Self-pay | Admitting: Cardiology

## 2011-01-29 ENCOUNTER — Encounter (HOSPITAL_COMMUNITY): Payer: Self-pay | Admitting: Pharmacy Technician

## 2011-01-29 ENCOUNTER — Ambulatory Visit (HOSPITAL_COMMUNITY): Payer: Medicare Other | Attending: Cardiology | Admitting: Radiology

## 2011-01-29 ENCOUNTER — Ambulatory Visit (INDEPENDENT_AMBULATORY_CARE_PROVIDER_SITE_OTHER): Payer: Medicare Other | Admitting: *Deleted

## 2011-01-29 DIAGNOSIS — I359 Nonrheumatic aortic valve disorder, unspecified: Secondary | ICD-10-CM

## 2011-01-29 DIAGNOSIS — I10 Essential (primary) hypertension: Secondary | ICD-10-CM | POA: Insufficient documentation

## 2011-01-29 DIAGNOSIS — E785 Hyperlipidemia, unspecified: Secondary | ICD-10-CM

## 2011-01-29 DIAGNOSIS — I079 Rheumatic tricuspid valve disease, unspecified: Secondary | ICD-10-CM | POA: Insufficient documentation

## 2011-01-29 DIAGNOSIS — I209 Angina pectoris, unspecified: Secondary | ICD-10-CM

## 2011-01-29 DIAGNOSIS — I350 Nonrheumatic aortic (valve) stenosis: Secondary | ICD-10-CM

## 2011-01-29 DIAGNOSIS — I2510 Atherosclerotic heart disease of native coronary artery without angina pectoris: Secondary | ICD-10-CM

## 2011-01-29 DIAGNOSIS — R079 Chest pain, unspecified: Secondary | ICD-10-CM

## 2011-01-29 LAB — BASIC METABOLIC PANEL
CO2: 33 mEq/L — ABNORMAL HIGH (ref 19–32)
Calcium: 9.1 mg/dL (ref 8.4–10.5)
Creatinine, Ser: 1.1 mg/dL (ref 0.4–1.5)
GFR: 68.75 mL/min (ref >60.00)
Sodium: 139 mEq/L (ref 135–145)

## 2011-01-29 LAB — TSH: TSH: 20.82 u[IU]/mL — ABNORMAL HIGH (ref 0.35–5.50)

## 2011-01-29 LAB — CBC WITH DIFFERENTIAL/PLATELET
Basophils Relative: 0.6 % (ref 0.0–3.0)
Eosinophils Absolute: 0.2 10*3/uL (ref 0.0–0.7)
Lymphocytes Relative: 16.7 % (ref 12.0–46.0)
MCHC: 34.5 g/dL (ref 30.0–36.0)
Monocytes Relative: 7.8 % (ref 3.0–12.0)
Neutrophils Relative %: 71.3 % (ref 43.0–77.0)
RBC: 4.12 Mil/uL — ABNORMAL LOW (ref 4.22–5.81)
WBC: 5.4 10*3/uL (ref 4.5–10.5)

## 2011-01-29 LAB — T3, FREE: T3, Free: 2.4 pg/mL (ref 2.3–4.2)

## 2011-01-29 LAB — T4, FREE: Free T4: 0.71 ng/dL (ref 0.60–1.60)

## 2011-01-29 NOTE — Telephone Encounter (Signed)
New problem:  Need order - left / heart cath.

## 2011-01-29 NOTE — Telephone Encounter (Signed)
I spoke with [name] and made her aware that this pt will need a work-up prior to his cardiac cath.  The pt has not been seen in the office since September and he will have a PA work up and his orders and note will be done at that time.

## 2011-02-01 ENCOUNTER — Encounter (HOSPITAL_COMMUNITY): Payer: Self-pay | Admitting: Cardiology

## 2011-02-01 ENCOUNTER — Ambulatory Visit (HOSPITAL_COMMUNITY)
Admission: RE | Admit: 2011-02-01 | Discharge: 2011-02-01 | Disposition: A | Discharge status: Home / Self Care | Payer: Medicare Other | Source: Ambulatory Visit | Attending: Cardiology | Admitting: Cardiology

## 2011-02-01 ENCOUNTER — Ambulatory Visit (HOSPITAL_COMMUNITY): Admission: RE | Admit: 2011-02-01 | Payer: Medicare Other | Source: Ambulatory Visit | Admitting: Cardiology

## 2011-02-01 ENCOUNTER — Encounter (HOSPITAL_COMMUNITY)
Admission: RE | Disposition: A | Discharge status: Home / Self Care | Payer: Self-pay | Source: Ambulatory Visit | Attending: Cardiology

## 2011-02-01 ENCOUNTER — Other Ambulatory Visit: Payer: Self-pay

## 2011-02-01 ENCOUNTER — Encounter (HOSPITAL_COMMUNITY): Admission: RE | Payer: Self-pay | Source: Ambulatory Visit

## 2011-02-01 DIAGNOSIS — R079 Chest pain, unspecified: Secondary | ICD-10-CM | POA: Insufficient documentation

## 2011-02-01 DIAGNOSIS — Z950 Presence of cardiac pacemaker: Secondary | ICD-10-CM | POA: Insufficient documentation

## 2011-02-01 DIAGNOSIS — I359 Nonrheumatic aortic valve disorder, unspecified: Secondary | ICD-10-CM

## 2011-02-01 DIAGNOSIS — I2510 Atherosclerotic heart disease of native coronary artery without angina pectoris: Secondary | ICD-10-CM | POA: Insufficient documentation

## 2011-02-01 DIAGNOSIS — I208 Other forms of angina pectoris: Secondary | ICD-10-CM | POA: Insufficient documentation

## 2011-02-01 DIAGNOSIS — I2089 Other forms of angina pectoris: Secondary | ICD-10-CM | POA: Insufficient documentation

## 2011-02-01 DIAGNOSIS — E039 Hypothyroidism, unspecified: Secondary | ICD-10-CM | POA: Insufficient documentation

## 2011-02-01 DIAGNOSIS — I209 Angina pectoris, unspecified: Secondary | ICD-10-CM | POA: Insufficient documentation

## 2011-02-01 DIAGNOSIS — I4891 Unspecified atrial fibrillation: Secondary | ICD-10-CM | POA: Insufficient documentation

## 2011-02-01 DIAGNOSIS — I25118 Atherosclerotic heart disease of native coronary artery with other forms of angina pectoris: Secondary | ICD-10-CM | POA: Insufficient documentation

## 2011-02-01 DIAGNOSIS — D6859 Other primary thrombophilia: Secondary | ICD-10-CM | POA: Insufficient documentation

## 2011-02-01 HISTORY — PX: LEFT AND RIGHT HEART CATHETERIZATION WITH CORONARY ANGIOGRAM: SHX5449

## 2011-02-01 LAB — POCT I-STAT 3, VENOUS BLOOD GAS (G3P V)
O2 Saturation: 69 %
TCO2: 25 mmol/L (ref 0–100)
pCO2, Ven: 46.4 mmHg (ref 45.0–50.0)
pO2, Ven: 40 mmHg (ref 30.0–45.0)

## 2011-02-01 LAB — POCT I-STAT 3, ART BLOOD GAS (G3+)
Acid-Base Excess: 1 mmol/L (ref 0.0–2.0)
Bicarbonate: 26.7 mEq/L — ABNORMAL HIGH (ref 20.0–24.0)
TCO2: 28 mmol/L (ref 0–100)
pO2, Arterial: 85 mmHg (ref 80.0–100.0)

## 2011-02-01 SURGERY — LEFT AND RIGHT HEART CATHETERIZATION WITH CORONARY ANGIOGRAM
Anesthesia: LOCAL

## 2011-02-01 SURGERY — LEFT AND RIGHT HEART CATHETERIZATION WITH CORONARY ANGIOGRAM
Anesthesia: LOCAL | Laterality: Bilateral

## 2011-02-01 MED ORDER — SODIUM CHLORIDE 0.9 % IJ SOLN: 3.0000 mL | INTRAMUSCULAR | Status: DC | PRN

## 2011-02-01 MED ORDER — FENTANYL CITRATE 0.05 MG/ML IJ SOLN
INTRAMUSCULAR | Status: AC
  Filled 2011-02-01: qty 2

## 2011-02-01 MED ORDER — ONDANSETRON HCL 4 MG/2ML IJ SOLN: 4.0000 mg | Freq: Four times a day (QID) | INTRAMUSCULAR | Status: DC | PRN

## 2011-02-01 MED ORDER — SODIUM CHLORIDE 0.9 % IV SOLN: 250.0000 mL | INTRAVENOUS | Status: DC | PRN

## 2011-02-01 MED ORDER — DIAZEPAM 5 MG PO TABS
5.0000 mg | ORAL_TABLET | ORAL | Status: AC
  Administered 2011-02-01: 5 mg via ORAL
  Filled 2011-02-01: qty 1

## 2011-02-01 MED ORDER — SODIUM CHLORIDE 0.9 % IV SOLN
INTRAVENOUS | Status: DC
  Administered 2011-02-01: 12:00:00 via INTRAVENOUS

## 2011-02-01 MED ORDER — NITROGLYCERIN 0.2 MG/ML ON CALL CATH LAB
INTRAVENOUS | Status: AC
  Filled 2011-02-01: qty 1

## 2011-02-01 MED ORDER — SODIUM CHLORIDE 0.9 % IJ SOLN: 3.0000 mL | Freq: Two times a day (BID) | INTRAMUSCULAR | Status: DC

## 2011-02-01 MED ORDER — FAMOTIDINE 20 MG PO TABS
20.0000 mg | ORAL_TABLET | ORAL | Status: AC
  Administered 2011-02-01: 20 mg via ORAL
  Filled 2011-02-01: qty 1

## 2011-02-01 MED ORDER — SODIUM CHLORIDE 0.9 % IV SOLN
1.0000 mL/kg/h | INTRAVENOUS | Status: DC
  Administered 2011-02-01: 1 mL/kg/h via INTRAVENOUS

## 2011-02-01 MED ORDER — HEPARIN (PORCINE) IN NACL 2-0.9 UNIT/ML-% IJ SOLN
INTRAMUSCULAR | Status: AC
  Filled 2011-02-01: qty 2000

## 2011-02-01 MED ORDER — DIPHENHYDRAMINE HCL 50 MG/ML IJ SOLN: 25.0000 mg | INTRAMUSCULAR | Status: DC

## 2011-02-01 MED ORDER — SODIUM CHLORIDE 0.9 % IV SOLN: INTRAVENOUS | Status: AC

## 2011-02-01 MED ORDER — MIDAZOLAM HCL 2 MG/2ML IJ SOLN
INTRAMUSCULAR | Status: AC
  Filled 2011-02-01: qty 2

## 2011-02-01 MED ORDER — HYDROCODONE-ACETAMINOPHEN 5-325 MG PO TABS
1.0000 | ORAL_TABLET | Freq: Once | ORAL | Status: AC
  Administered 2011-02-01: 1 via ORAL
  Filled 2011-02-01: qty 1

## 2011-02-01 MED ORDER — ACETAMINOPHEN 325 MG PO TABS: 650.0000 mg | ORAL_TABLET | ORAL | Status: DC | PRN

## 2011-02-01 MED ORDER — LIDOCAINE HCL (PF) 1 % IJ SOLN
INTRAMUSCULAR | Status: AC
  Filled 2011-02-01: qty 30

## 2011-02-01 NOTE — Progress Notes (Signed)
Dr Riley Kill called about pt question about when to resume coumadin and per Dr Riley Kill pt to take 2mg  tonight and pt notified and voiced understanding

## 2011-02-01 NOTE — H&P (Signed)
Patient ID: [name] MRN: [id number] DOB/AGE: January 17, 1935 76 y.o. 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  He has a long standing cardiac history, including Syndrome X, CAD, atrial fib/flutter, sick sinus syndrome, and worsening aortic stenosis.  He develop iatrogenic hypothyroidism earlier in the summer after stopping thyroid, and was in flutter.  He was cardioverted successfully.  His synthroid is undergoing replacement, but he has develop chest discomfort with exertion that has been worsening, raising the possibility of progressive CAD.  We have anticipated possible AVR in the spring as his velocity is 3.7 m/s, and expected angina greater potentially because of his well described syndrome X dating to the 74s.  He is brought in now for repeat diagnostic cath procedure.    Past Medical History  Diagnosis Date  . Paroxysmal atrial fibrillation   . Chronotropic incompetence with sinus node dysfunction     Status post Guidant dual-mode, dual-pacing, dual-sensing  pacemaker   implantation now programmed to AAI with recent generator change.  . Coronary artery disease     status post multiple prior percutaneous coronary interventions, microvascular angina per Dr [name]  . Aortic stenosis     moderate aortic stenosis  . Hyperlipidemia   . Hypercoagulable state     chronically anticoagulated with coumadin    Past Surgical History  Procedure Date  . Pacemaker insertion 1991    Guidant PPM, most recent Generator Change by Dr [name] was 08/22/06    No family history on file:  Pos for CAD.  Mother and twin brother  History   Social History  . Marital Status: Married    Spouse Name: Dondra Spry    Number of Children: 3  . Years of Education: Pulmonary/Allergy medicine    Occupational History  . Not on file.   Social History Main Topics  . Smoking status: Never  Smoker   . Smokeless tobacco: Not on file  . Alcohol Use: No  . Drug Use: No  . Sexually Active: Not on file   Other Topics Concern  . Not on file   Social History Narrative  . 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  This SmartLink requires parameters. Parameters are variables that are added to the [facility] name to request specific information. The parameter for lastwt is the number of readings to display.  For example: .lastwt[4  In this example, the SmartLink displays the last four encounter readings.   Pt is alert and oriented, WD, WN, in no distress. HEENT: normal Neck: JVP normal. Carotid upstrokes normal without bruits. No thyromegaly. Lungs: equal expansion, clear bilaterally CV: Normal S1 and fixed S2.  Mid to late peaking SEM  3-4/6 c/w AS Abd: soft, NT, +BS, no bruit, no hepatosplenomegaly Back: no CVA tenderness Ext: no C/C/E        Femoral pulses 2+= without bruits        DP/PT pulses intact and =  Skin: warm and dry without rash Neuro: CNII-XII intact               Labs:   Lab Results  Component Value Date   WBC 5.4 01/29/2011   HGB 12.7* 01/29/2011   HCT 36.8* 01/29/2011   MCV 89.3 01/29/2011   PLT 227.0 01/29/2011    Lab 01/29/11 0903  NA 139  K 3.9  CL 101  CO2 33*  BUN 17  CREATININE 1.1  CALCIUM 9.1  PROT --  BILITOT --  ALKPHOS --  ALT --  AST --  GLUCOSE 117*   No results found for this basename: CKTOTAL, CKMB,  CKMBINDEX, TROPONINI    Lab Results  Component Value Date   CHOL 239* 11/16/2010   Lab Results  Component Value Date   HDL 99.40 11/16/2010   No results found for this basename: [facility]   Lab Results  Component Value Date   TRIG 90.0 11/16/2010   Lab Results  Component Value Date   CHOLHDL 2 11/16/2010   Lab Results  Component Value Date   LDLDIRECT 117.9 11/16/2010      Radiology: No results found. EKG  ASSESSMENT AND PLAN:  Aortic Stenosis, Severe CAD with well described multiple PCI Syndrome X PAF Hypercoagulable syndrome  Plan: Cardiac Catheterization---the risks and indications have been thoroughly discussed with the patient, who wishes to proceed.  Labs have been reviewed.  Repeat PT INR is pending.   Signed: Shawnie Pons 02/01/2011, 6:47 AM

## 2011-02-01 NOTE — Op Note (Signed)
Cardiac Catheterization Procedure Note  Name: [name] MRN: [id number] DOB: October 14, 1934  Procedure: Right Heart Cath, Left Heart Cath, Selective Coronary Angiography, LV angiography  Indication:    Procedural Details: The right groin was prepped, draped, and anesthetized with 1% lidocaine. Using the modified Seldinger technique a 5 French sheath was placed in the right femoral artery and a 7 French sheath was placed in the right femoral vein. A Swan-Ganz catheter was used for the right heart catheterization. Standard protocol was followed for recording of right heart pressures and sampling of oxygen saturations. Fick cardiac output was calculated. Standard Judkins catheters were used for selective coronary angiography and left ventriculography. There were no immediate procedural complications. The patient was transferred to the post catheterization recovery area for further monitoring.  Procedural Findings: Hemodynamics RA 7/6/5 RV 26/5 PA 25/13 PCWP 11 LV Not done AO 147/82 (106)  Oxygen saturations: PA 69% AO 96%  Cardiac Output (Fick) 6.6 L/min  Cardiac Index (Fick) 2.85 L/min/m2 Thermodilution  CO:  5.46 L/min Thermodilution Index:  2.37 L/min/M2   Coronary angiography: Coronary dominance: right  Left mainstem: The left main coronary artery was a large-caliber vessel that was free of significant disease.   LAD:  The left anterior descending artery courses to the cardiac apex there is calcification and mild luminal irregularity throughout the first septal perforator has about 50% narrowing and there is a second septal perforator that has approximately 80-90% narrowing but is a small caliber vessel. The distal left anterior descending artery wraps the apex and provides twin apical vessels. Other than luminal regularity is free of critical disease.  Left Circumflex: The circumflex coronary artery provides a ramus intermedius and a large AV circumflex that supplies a marginal  branch. The  ramus intermedius  bifurcates into superior branch has approximately 40-50% narrowing but supplies a moderate size distal vessel the inferior subbranch appears to be widely patent. The AV portion of the circumflex coronary artery supplies a large marginal branch and has been previously stented. The stents are widely patent.   Right coronary artery (RCA): The right coronary artery is calcified there is moderate calcification in the mid vessel near the acute margin his knee centric 50% area of stenosis, but there is excellent flow through this territory. There is mild luminal irregularity in the distal right coronary. This supplies a posterior descending and posterolateral branches which are small and free of significant disease.  Left ventriculography: Not done  Final Conclusions:  1.  Severe aortic stenosis by echocardiography. 2. Continued patency of the circumflex stents. 3. Progressive angina pectoris with thyroid replacement therapy. 4.  Scattered non critical CAD  Recommendations:  This is a difficult situation.  His AS is not critical, but may be symptomatic because of his progressive AS.  I have discussed with my partners, and am leaning toward replacement of the valve.  Given his long standing classic syndrome X, which his identical twin, Dr. [name], also has, he may not be rendered asymptomatic with AVR.  Will refer to Dr. Laneta Simmers.  Shawnie Pons 02/01/2011, 2:31 PM

## 2011-02-08 ENCOUNTER — Encounter: Payer: Self-pay | Admitting: Internal Medicine

## 2011-02-09 ENCOUNTER — Encounter: Payer: Self-pay | Admitting: Surgery

## 2011-02-09 ENCOUNTER — Other Ambulatory Visit: Payer: Self-pay | Admitting: Dermatology

## 2011-02-09 ENCOUNTER — Encounter: Payer: Self-pay | Admitting: *Deleted

## 2011-02-09 ENCOUNTER — Institutional Professional Consult (permissible substitution) (INDEPENDENT_AMBULATORY_CARE_PROVIDER_SITE_OTHER): Payer: Medicare Other | Admitting: Surgery

## 2011-02-09 VITALS — BP 124/76 | HR 60 | Resp 18 | Ht 74.0 in | Wt 225.0 lb

## 2011-02-09 DIAGNOSIS — I359 Nonrheumatic aortic valve disorder, unspecified: Secondary | ICD-10-CM

## 2011-02-10 ENCOUNTER — Other Ambulatory Visit: Payer: Self-pay | Admitting: Surgery

## 2011-02-10 DIAGNOSIS — I359 Nonrheumatic aortic valve disorder, unspecified: Secondary | ICD-10-CM

## 2011-02-11 ENCOUNTER — Other Ambulatory Visit: Payer: Self-pay

## 2011-02-11 DIAGNOSIS — I359 Nonrheumatic aortic valve disorder, unspecified: Secondary | ICD-10-CM

## 2011-02-13 ENCOUNTER — Encounter: Payer: Self-pay | Admitting: Surgery

## 2011-02-13 NOTE — Progress Notes (Signed)
[address]            [place] [address]          [phone]      PCP is Rogelia Boga, MD Referring Provider is Shawnie Pons, MD  Chief Complaint  Patient presents with  . 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 He also has a history of atrial fibrillation and has been maintained on amiodarone and Coumadin. He subsequently developed hypothyroidism related to the amiodarone and was started on replacement therapy. With replacement therapy he developed increased atrial arrhythmias and therefore stopped the replacement therapy himself last summer. He developed symptomatic hypothyroidism and therefore restarted the replacement therapy. Since that has been restarted he has developed increased angina. He describes this as being a squeezing pressure in his chest radiating up into his neck and jaw as well as into his left arm. This is related to exertion and has been occurring with less exertion. He is at the point where he can no longer walk his dog without getting chest discomfort. His most recent echocardiogram from 01/29/2011 showed severe aortic stenosis with a peak gradient of 61 mm mercury and a mean gradient of 31 mm mercury. His ejection fraction was 60% with mild left ventricular hypertrophy. His indexed aortic valve area was 0.4cm2/m2. This had not changed much from his previous echo in September of 2012. He subsequently underwent catheterization to rule out recurrent coronary stenosis as the cause of his increased anginal symptoms. Cardiac catheterization showed patent stents in the left circumflex coronary artery and other scattered noncritical coronary disease. His right heart pressures were within normal limits with a cardiac index of 2.85.  Past Medical  History  Diagnosis Date  . Paroxysmal atrial fibrillation   . Chronotropic incompetence with sinus node dysfunction     Status post Guidant dual-mode, dual-pacing, dual-sensing  pacemaker   implantation now programmed to AAI with recent generator change.  . Coronary artery disease     status post multiple prior percutaneous coronary interventions, microvascular angina per Dr [name]  . Aortic stenosis     moderate aortic stenosis  . Hyperlipidemia   . Hypercoagulable state     chronically anticoagulated with coumadin    Past Surgical History  Procedure Date  . Pacemaker insertion 1991    Guidant PPM, most recent Generator Change by Dr [name] was 08/22/06    Family History  Problem Relation Age of Onset  . Heart disease Brother     Twin brother has coronary disease and recent AVR for AS    Social History History  Substance Use Topics  . Smoking status: Never Smoker   . Smokeless tobacco: Not on file  . Alcohol Use: No    Current Outpatient Prescriptions  Medication Sig Dispense Refill  . HYDROcodone-acetaminophen (NORCO) 5-325 MG per tablet Take 1 tablet by mouth 2 (two) times daily.        Marland Kitchen levothyroxine (SYNTHROID, LEVOTHROID) 25 MCG tablet Take 50 mcg by mouth daily.       Marland Kitchen LORazepam (ATIVAN) 1 MG tablet Take 1 mg by mouth once as needed.        Marland Kitchen amiodarone (PACERONE) 200 MG tablet  Take 200 mg by mouth daily.       Marland Kitchen aspirin 81 MG tablet Take 81 mg by mouth daily.       Marland Kitchen co-enzyme Q-10 30 MG capsule Take 30 mg by mouth daily.       . hydrochlorothiazide 25 MG tablet Take 25 mg by mouth daily.       . metoprolol (TOPROL-XL) 50 MG 24 hr tablet Take 50 mg by mouth at bedtime.        . potassium chloride SA (K-DUR,KLOR-CON) 20 MEQ tablet Take 30 mEq by mouth daily.        . rosuvastatin (CRESTOR) 40 MG tablet Take 20 mg by mouth at bedtime.       . Tamsulosin HCl (FLOMAX) 0.4 MG CAPS Take 0.4 mg by mouth at bedtime.        Marland Kitchen warfarin (COUMADIN) 4 MG tablet Take 4 mg by  mouth as directed.         Allergies  Allergen Reactions  . Contrast Media (Iodinated Diagnostic Agents) Hives    Review of Systems:  General: He denies fever or chills. He said no recent weight changes. He does report some fatigue. Eyes: Negative ENT: Negative. He does see a dentist regularly. Endocrine: he denies diabetes. He has hypothyroidism. Cardiac: He has exertional chest tightness and pressure radiating to his neck, jaw, and left arm. He has shortness of breath with exertion as well as occasionally at rest. He reports palpitations and always knows when he is in atrial fibrillation. He denies peripheral edema. Respiratory: He denies cough and sputum production. GI: He denies nausea vomiting. He has had no melena or bright red blood per rectum. GU: He denies dysuria and hematuria. 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 He has no significant residual deficit. He denies any focal weakness or numbness. He denies dizziness and syncope. Musculoskeletal: He denies arthralgias and myalgias.  BP 124/76  Pulse 60  Resp 18  Ht 6\' 2"  (1.88 m)  Wt 225 lb (102.059 kg)  BMI 28.89 kg/m2  SpO2 96% Physical Exam:  He is a well-developed white male in no distress. HEENT: Normocephalic and atraumatic. Pupils are equal and reactive to light. Extraocular muscles are intact. Oropharynx is clear. Teeth are in good condition. Neck: Carotid pulses are palpable bilaterally. There no bruits. There is no adenopathy or thyromegaly. Heart: Regular rate and rhythm with a grade 3/6 systolic murmur over the aorta. Lungs: Clear Abdomen: Bowel sounds are present. Soft and nontender. There are no palpable masses organomegaly. Extremities: No peripheral edema.  Diagnostic Tests: ------------------------------------------------------------ Transthoracic Echocardiography  Patient:    Axl, Rodino MR #:       [id number] Study Date:  01/29/2011 Gender:     M Age:        39 Height:     188cm Weight:     100kg BSA:        2.18m^2 Pt. Status: Room:    ORDERING     Shawnie Pons, MD  REFERRING    Shawnie Pons, MD  SONOGRAPHER  Luvenia Redden  ATTENDING    Willa Rough, MD, [facility]  PERFORMING   [facility], [facility] cc:  ------------------------------------------------------------ LV EF: 60%  ------------------------------------------------------------ Indications:      424.1 Aortic valve disorders.  ------------------------------------------------------------ History:   PMH:  Acquired from the patient and from the patient's chart.  Atrial fibrillation.  Atrial flutter. Coronary artery disease.  Aortic  valve disease.  Risk factors:  Hypertension. Dyslipidemia.  ------------------------------------------------------------ Study Conclusions  - Left ventricle: The cavity size was mildly dilated. Wall   thickness was increased in a pattern of mild LVH. The   estimated ejection fraction was 60%. Wall motion was   normal; there were no regional wall motion abnormalities. - Aortic valve: Calcified with aortic stenosis. The   gradients obtained today are very similar to the study of   10/2010. There is one beat with slight increase in velocity   at 3.57m/sec. Mean gradient: 31mm Hg (S). Peak gradient:   61mm Hg (S). - Mitral valve: Calcified annulus. - Left atrium: The atrium was moderately dilated. - Right ventricle: Pacer wire or catheter noted in right   ventricle. - Right atrium: The atrium was mildly dilated. Pacer wire or   catheter noted in right atrium. Transthoracic echocardiography.  M-mode, complete 2D, spectral Doppler, and color Doppler.  Height:  Height: 188cm. Height: 74in.  Weight:  Weight: 100kg. Weight: 220lb.  Body mass index:  BMI: 28.3kg/m^2.  Body surface area: BSA: 2.29m^2.  Patient status:  Outpatient.  Location:  [facility]  3  ------------------------------------------------------------  ------------------------------------------------------------ Left ventricle:  The cavity size was mildly dilated. Wall thickness was increased in a pattern of mild LVH. The estimated ejection fraction was 60%. Wall motion was normal; there were no regional wall motion abnormalities.  ------------------------------------------------------------ Aortic valve:  Calcified with aortic stenosis. The gradients obtained today are very similar to the study of 10/2010. There is one beat with slight increase in velocity at 3.104m/sec.  Doppler:     VTI ratio of LVOT to aortic valve: 0.2. Indexed valve area: 0.42cm^2/m^2 (VTI). Peak velocity ratio of LVOT to aortic valve: 0.18. Indexed valve area: 0.39cm^2/m^2 (Vmax).    Mean gradient: 31mm Hg (S). Peak gradient: 61mm Hg (S).  ------------------------------------------------------------ Mitral valve:   Calcified annulus.  Doppler:   No significant regurgitation.  ------------------------------------------------------------ Left atrium:  The atrium was moderately dilated.  ------------------------------------------------------------ Right ventricle:  The cavity size was normal. Pacer wire or catheter noted in right ventricle. Systolic function was normal.  ------------------------------------------------------------ Pulmonic valve:    The valve appears to be grossly normal.  Doppler:   No significant regurgitation.  ------------------------------------------------------------ Tricuspid valve:   Structurally normal valve.   Leaflet separation was normal.  Doppler:  Transvalvular velocity was within the normal range.  Mild regurgitation.  ------------------------------------------------------------ Right atrium:  The atrium was mildly dilated. Pacer wire or catheter noted in right atrium.  ------------------------------------------------------------ Pericardium:  There was  no pericardial effusion.  ------------------------------------------------------------  2D measurements        Normal  Doppler measurements    Norma Left ventricle                                         l LVID ED,   55.4 mm     43-52   Main pulmonary artery chord,                         Pressure,   23 mm Hg    =30 PLAX                           S LVID ES,   36.1 mm     23-38   Left ventricle chord,  Ea, lat    4.6 cm/s     ----- PLAX                           ann, tiss    1 FS, chord,   35 %      >29     DP PLAX                           E/Ea, lat  10.          ----- LVPW, ED   11.2 mm     ------  ann, tiss   17 IVS/LVPW   1.03        <1.3    DP ratio, ED                      Ea, med    5.9 cm/s     ----- Ventricular septum             ann, tiss    2 IVS, ED    11.5 mm     ------  DP LVOT                           E/Ea, med  7.9          ----- Diam, S      25 mm     ------  ann, tiss    2 Area       4.91 cm^2   ------  DP Diam         25 mm     ------  LVOT Aorta                          Peak vel,  71. cm/s     ----- Root diam,   37 mm     ------  S            4 ED                             VTI, S     20. cm       ----- Left atrium                                 2 AP dim       52 mm     ------  HR          60 bpm      ----- AP dim     2.26 cm/m^2 <2.2    Stroke vol 99. ml       ----- index                                       2                                Cardiac    5.9 L/min    -----  output                                Cardiac    2.6 L/(min-m -----                                index          ^2)                                Stroke     43. ml/m^2   -----                                index        1                                Aortic valve                                Peak vel,  389 cm/s     -----                                S                                Mean vel,  243 cm/s     -----                                 S                                VTI, S     103 cm       -----                                Mean        31 mm Hg    -----                                gradient,                                S                                Peak        61 mm Hg    -----                                gradient,  S                                VTI ratio  0.2          -----                                LVOT/AV                                Area index 0.4 cm^2/m^2 -----                                (VTI)        2                                Peak vel   0.1          -----                                ratio,       8                                LVOT/AV                                Area index 0.3 cm^2/m^2 -----                                (Vmax)       9                                Regurg PHT 342 ms       -----                                Mitral valve                                Peak E vel 46. cm/s     -----                                             9                                Peak A vel 72. cm/s     -----                                             1  Decelerati 197 ms       150-2                                on time                 30                                Peak E/A   0.7          -----                                ratio                                Tricuspid valve                                Regurg     213 cm/s     -----                                peak vel                                Peak RV-RA  18 mm Hg    -----                                gradient,                                S                                Systemic veins                                Estimated    5 mm Hg    -----                                CVP                                Right ventricle                                Pressure,   23 mm Hg    <30                                S                                 Sa vel,  10. cm/s     -----                                lat ann,     9                                tiss DP   ------------------------------------------------------------ Prepared and Electronically Authenticated by  Willa Rough, MD, [facility] 2012-11-30T15:48:50.990 Procedural Findings: Hemodynamics RA 7/6/5 RV 26/5 PA 25/13 PCWP 11 LV Not done AO 147/82 (106)  Oxygen saturations: PA 69% AO 96%  Cardiac Output (Fick) 6.6 L/min  Cardiac Index (Fick) 2.85 L/min/m2 Thermodilution  CO:  5.46 L/min Thermodilution Index:  2.37 L/min/M2   Coronary angiography: Coronary dominance: right  Left mainstem: The left main coronary artery was a large-caliber vessel that was free of significant disease.   LAD:  The left anterior descending artery courses to the cardiac apex there is calcification and mild luminal irregularity throughout the first septal perforator has about 50% narrowing and there is a second septal perforator that has approximately 80-90% narrowing but is a small caliber vessel. The distal left anterior descending artery wraps the apex and provides twin apical vessels. Other than luminal regularity is free of critical disease.  Left Circumflex: The circumflex coronary artery provides a ramus intermedius and a large AV circumflex that supplies a marginal branch. The  ramus intermedius  bifurcates into superior branch has approximately 40-50% narrowing but supplies a moderate size distal vessel the inferior subbranch appears to be widely patent. The AV portion of the circumflex coronary artery supplies a large marginal branch and has been previously stented. The stents are widely patent.    Right coronary artery (RCA): The right coronary artery is calcified there is moderate calcification in the mid vessel near the acute margin his knee centric 50% area of stenosis, but there is excellent flow through this territory. There is mild luminal  irregularity in the distal right coronary. This supplies a posterior descending and posterolateral branches which are small and free of significant disease.  Left ventriculography: Not done  Final Conclusions:  1.  Severe aortic stenosis by echocardiography. 2. Continued patency of the circumflex stents. 3. Progressive angina pectoris with thyroid replacement therapy. 4.  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 He has no significant coronary disease at this time and patent stents in his left circumflex coronary artery. He has a diagnosis of syndrome X with microvascular angina which may explain why he has such disabling angina with his aortic stenosis. He also has a history of paroxysmal atrial fibrillation that has been somewhat difficult to control and very symptomatic. He can feel every irregular beat. I would recommend proceeding with aortic valve replacement and possibly a left-sided Maze procedure. He has a permanent dual-chamber pacemaker and therefore I don't think a right-sided Maze procedure would be possible. I recommended using a tissue valve given his age even though he has been maintained on Coumadin for his atrial fibrillation. A bovine pericardial valve would have excellent longevity in his age group and would obviate the need for lifelong anticoagulation with Coumadin if it needed to be stopped for some other reason. I discussed the operative procedure with the patient and his wife including alternatives, benefits and risks; including but not limited to bleeding, blood transfusion,  infection, stroke, myocardial infarction, heart block requiring a permanent pacemaker, possible malfunction of his permanent pacer requiring revision, organ dysfunction, and death.  Kerry Kass understands and agrees to proceed.  We will schedule surgery for early January at his request.  I will discuss the potential  benefits and risks of a left-sided Maze procedure with him prior to that time.  Plan:  His wife will call us to schedule a date in the near future.

## 2011-02-22 ENCOUNTER — Encounter (HOSPITAL_COMMUNITY): Payer: Self-pay | Admitting: Emergency Medicine

## 2011-02-22 ENCOUNTER — Emergency Department (HOSPITAL_COMMUNITY)
Admission: EM | Admit: 2011-02-22 | Discharge: 2011-02-22 | Disposition: A | Discharge status: Home / Self Care | Payer: Medicare Other | Attending: Emergency Medicine | Admitting: Emergency Medicine

## 2011-02-22 DIAGNOSIS — IMO0002 Reserved for concepts with insufficient information to code with codable children: Secondary | ICD-10-CM | POA: Insufficient documentation

## 2011-02-22 DIAGNOSIS — E785 Hyperlipidemia, unspecified: Secondary | ICD-10-CM | POA: Insufficient documentation

## 2011-02-22 DIAGNOSIS — S01512A Laceration without foreign body of oral cavity, initial encounter: Secondary | ICD-10-CM

## 2011-02-22 DIAGNOSIS — Z950 Presence of cardiac pacemaker: Secondary | ICD-10-CM | POA: Insufficient documentation

## 2011-02-22 DIAGNOSIS — I4891 Unspecified atrial fibrillation: Secondary | ICD-10-CM | POA: Insufficient documentation

## 2011-02-22 DIAGNOSIS — Z7901 Long term (current) use of anticoagulants: Secondary | ICD-10-CM | POA: Insufficient documentation

## 2011-02-22 DIAGNOSIS — I2510 Atherosclerotic heart disease of native coronary artery without angina pectoris: Secondary | ICD-10-CM | POA: Insufficient documentation

## 2011-02-22 DIAGNOSIS — Z79899 Other long term (current) drug therapy: Secondary | ICD-10-CM | POA: Insufficient documentation

## 2011-02-22 DIAGNOSIS — Y92009 Unspecified place in unspecified non-institutional (private) residence as the place of occurrence of the external cause: Secondary | ICD-10-CM | POA: Insufficient documentation

## 2011-02-22 DIAGNOSIS — S01502A Unspecified open wound of oral cavity, initial encounter: Secondary | ICD-10-CM | POA: Insufficient documentation

## 2011-02-22 DIAGNOSIS — Z7982 Long term (current) use of aspirin: Secondary | ICD-10-CM | POA: Insufficient documentation

## 2011-02-22 NOTE — ED Notes (Signed)
Pt bit tongue and now can not get the bleeding to stop.  Pt is on coumadin.

## 2011-02-22 NOTE — ED Provider Notes (Signed)
History     CSN: [id number]  Arrival date & time 02/22/11  1618   First MD Initiated Contact with Patient 02/22/11 1626      Chief Complaint  Patient presents with  . Coagulation Disorder  tongue laceration  (Consider location/radiation/quality/duration/timing/severity/associated sxs/prior treatment) HPI This 76 year old male hit the dorsum of his tongue accidentally at home just prior to arrival and could not control the bleeding with local pressure at home but by the time he arrived here in the ED his bleeding is now stopped. 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  He has no other complaints at this time. He has no difficulty breathing currently. He is awaiting surgical repair for aortic stenosis sometime over the next several weeks for stable exertional shortness of breath. Past Medical History  Diagnosis Date  . Paroxysmal atrial fibrillation   . Chronotropic incompetence with sinus node dysfunction     Status post Guidant dual-mode, dual-pacing, dual-sensing  pacemaker   implantation now programmed to AAI with recent generator change.  . Coronary artery disease     status post multiple prior percutaneous coronary interventions, microvascular angina per Dr [name]  . Aortic stenosis     moderate aortic stenosis  . Hyperlipidemia   . Hypercoagulable state     chronically anticoagulated with coumadin    Past Surgical History  Procedure Date  . Pacemaker insertion 1991    Guidant PPM, most recent Generator Change by Dr [name] was 08/22/06    Family History  Problem Relation Age of Onset  . Heart disease Brother     Twin brother has coronary disease and recent AVR for AS    History  Substance Use Topics  . Smoking status: Never Smoker   . Smokeless tobacco: Not on file  .  Alcohol Use: No      Review of Systems  Constitutional: Negative for diaphoresis.  Respiratory: Negative for shortness of breath.   Cardiovascular: Negative for chest pain.  Neurological: Negative for light-headedness.    Allergies  Contrast media  Home Medications   Current Outpatient Rx  Name Route Sig Dispense Refill  . AMIODARONE HCL 200 MG PO TABS Oral Take 200 mg by mouth daily.     . ASPIRIN 81 MG PO TABS Oral Take 81 mg by mouth daily.     Marland Kitchen COENZYME Q10 30 MG PO CAPS Oral Take 30 mg by mouth daily.     Marland Kitchen HYDROCHLOROTHIAZIDE 25 MG PO TABS Oral Take 25 mg by mouth daily.     Marland Kitchen HYDROCODONE-ACETAMINOPHEN 5-325 MG PO TABS Oral Take 1 tablet by mouth every 6 (six) hours as needed. For pain    . LEVOTHYROXINE SODIUM 25 MCG PO TABS Oral Take 50 mcg by mouth daily.     Marland Kitchen LORAZEPAM 1 MG PO TABS Oral Take 1 mg by mouth at bedtime as needed. For anxiety    . METOPROLOL SUCCINATE ER 50 MG PO TB24 Oral Take 50 mg by mouth at bedtime.      Marland Kitchen POTASSIUM CHLORIDE CRYS CR 20 MEQ PO TBCR Oral Take 30 mEq by mouth daily.      Marland Kitchen ROSUVASTATIN CALCIUM 40 MG PO TABS Oral Take 20 mg by mouth at bedtime.     . TAMSULOSIN HCL 0.4 MG PO CAPS Oral  Take 0.4 mg by mouth at bedtime.      . WARFARIN SODIUM 4 MG PO TABS Oral Take 4 mg by mouth daily.       BP 163/81  Pulse 64  Resp 18  SpO2 97%  Physical Exam  Nursing note and vitals reviewed. Constitutional:       Awake, alert, nontoxic appearance.  Normal speech and gait  HENT:  Mouth/Throat: Oropharynx is clear and moist.       Superficial 2 mm dorsal tongue laceration which is not gaping and not actively bleeding  Eyes: Right eye exhibits no discharge. Left eye exhibits no discharge.  Neck: Neck supple.  Pulmonary/Chest: Effort normal. No respiratory distress.  Musculoskeletal:       Baseline ROM, no obvious new focal weakness.  Neurological: He is alert.       Mental status and motor strength appears baseline for patient and situation.    Skin: No pallor.  Psychiatric: He has a normal mood and affect.    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 He will apply a QR powder at home if needed if his tongue rebleeds Labs Reviewed - No data to display No results found.   1. Tongue laceration       MDM          Hurman Horn, MD 02/23/11 [phone]

## 2011-03-11 ENCOUNTER — Other Ambulatory Visit: Payer: Self-pay

## 2011-03-11 ENCOUNTER — Ambulatory Visit (HOSPITAL_COMMUNITY)
Admission: RE | Admit: 2011-03-11 | Discharge: 2011-03-11 | Disposition: A | Discharge status: Home / Self Care | Payer: Medicare Other | Source: Ambulatory Visit | Attending: Surgery | Admitting: Surgery

## 2011-03-11 ENCOUNTER — Encounter (HOSPITAL_COMMUNITY)
Admission: RE | Admit: 2011-03-11 | Discharge: 2011-03-11 | Disposition: A | Discharge status: Home / Self Care | Payer: Medicare Other | Source: Ambulatory Visit | Attending: Surgery | Admitting: Surgery

## 2011-03-11 ENCOUNTER — Encounter (HOSPITAL_COMMUNITY): Payer: Self-pay

## 2011-03-11 DIAGNOSIS — I359 Nonrheumatic aortic valve disorder, unspecified: Secondary | ICD-10-CM | POA: Diagnosis not present

## 2011-03-11 DIAGNOSIS — R0989 Other specified symptoms and signs involving the circulatory and respiratory systems: Secondary | ICD-10-CM | POA: Diagnosis not present

## 2011-03-11 DIAGNOSIS — Z01818 Encounter for other preprocedural examination: Secondary | ICD-10-CM | POA: Insufficient documentation

## 2011-03-11 DIAGNOSIS — Z01811 Encounter for preprocedural respiratory examination: Secondary | ICD-10-CM | POA: Diagnosis not present

## 2011-03-11 DIAGNOSIS — R0609 Other forms of dyspnea: Secondary | ICD-10-CM | POA: Insufficient documentation

## 2011-03-11 DIAGNOSIS — Z01812 Encounter for preprocedural laboratory examination: Secondary | ICD-10-CM | POA: Diagnosis not present

## 2011-03-11 DIAGNOSIS — Z01810 Encounter for preprocedural cardiovascular examination: Secondary | ICD-10-CM | POA: Insufficient documentation

## 2011-03-11 DIAGNOSIS — R079 Chest pain, unspecified: Secondary | ICD-10-CM | POA: Diagnosis not present

## 2011-03-11 HISTORY — DX: Thyrotoxicosis, unspecified without thyrotoxic crisis or storm: E05.90

## 2011-03-11 HISTORY — DX: Hypothyroidism, unspecified: E03.9

## 2011-03-11 HISTORY — DX: Benign prostatic hyperplasia without lower urinary tract symptoms: N40.0

## 2011-03-11 HISTORY — DX: Unspecified osteoarthritis, unspecified site: M19.90

## 2011-03-11 HISTORY — DX: Cardiac murmur, unspecified: R01.1

## 2011-03-11 HISTORY — DX: Cerebral infarction, unspecified: I63.9

## 2011-03-11 LAB — URINALYSIS, ROUTINE W REFLEX MICROSCOPIC
Bilirubin Urine: NEGATIVE
Leukocytes, UA: NEGATIVE
Nitrite: NEGATIVE
Specific Gravity, Urine: 1.018 (ref 1.005–1.030)
pH: 7 (ref 5.0–8.0)

## 2011-03-11 LAB — COMPREHENSIVE METABOLIC PANEL
ALT: 16 U/L (ref 0–53)
AST: 25 U/L (ref 0–37)
CO2: 26 mEq/L (ref 19–32)
Chloride: 103 mEq/L (ref 96–112)
GFR calc non Af Amer: 60 mL/min — ABNORMAL LOW (ref >90)
Potassium: 3.9 mEq/L (ref 3.5–5.1)
Sodium: 139 mEq/L (ref 135–145)
Total Bilirubin: 0.5 mg/dL (ref 0.3–1.2)

## 2011-03-11 LAB — BLOOD GAS, ARTERIAL
Acid-base deficit: 0.3 mmol/L (ref 0.0–2.0)
Drawn by: 344381
FIO2: 0.21 %
pCO2 arterial: 39.7 mmHg (ref 35.0–45.0)
pO2, Arterial: 62.1 mmHg — ABNORMAL LOW (ref 80.0–100.0)

## 2011-03-11 LAB — TSH: TSH: 8.793 u[IU]/mL — ABNORMAL HIGH (ref 0.350–4.500)

## 2011-03-11 LAB — TYPE AND SCREEN
Antibody Screen: NEGATIVE
Unit division: 0
Unit division: 0

## 2011-03-11 LAB — CBC
Platelets: 229 10*3/uL (ref 150–400)
RBC: 4.29 MIL/uL (ref 4.22–5.81)
WBC: 5.8 10*3/uL (ref 4.0–10.5)

## 2011-03-11 LAB — SURGICAL PCR SCREEN
MRSA, PCR: NEGATIVE
Staphylococcus aureus: NEGATIVE

## 2011-03-11 LAB — ABO/RH: ABO/RH(D): A POS

## 2011-03-11 LAB — PROTIME-INR: Prothrombin Time: 22.8 seconds — ABNORMAL HIGH (ref 11.6–15.2)

## 2011-03-11 LAB — PULMONARY FUNCTION TEST

## 2011-03-11 NOTE — Progress Notes (Signed)
Forwarded labs to anesthesia for review.  [employer] rep, Juan Quam phone # [phone]

## 2011-03-11 NOTE — Progress Notes (Signed)
VASCULAR LAB PRELIMINARY  PRELIMINARY  PRELIMINARY  PRELIMINARY  Pre-op Cardiac Surgery  Carotid Findings:  Bilateral: No evidence of hemodynamically significant internal carotid artery stenosis.   Right:  Vertebral artery flow is antegrade.  Left:  Vertebral artery occluded.     Upper Extremity Right Left  Brachial Pressures 138 tri 164 tri  Radial Waveforms tri tri  Ulnar Waveforms tri tri  Palmar Arch (Allen's Test) Within normal limits. Within normal limits   Findings:  Within normal limits.    Terance Hart, RVT 03/11/2011, 6:59 PM

## 2011-03-11 NOTE — Progress Notes (Signed)
Faxed prescription for cardiac device to [facility], contacted [employer], notified of patients upcoming surgery date 03/15/2011, rep is Juan Quam.

## 2011-03-11 NOTE — Pre-Procedure Instructions (Signed)
20 DARELL SAPUTO  03/11/2011   Your procedure is scheduled on:  Monday March 15, 2011  Report to [facility] at 0530 AM.  Call this number if you have problems the morning of surgery: [phone]   Remember:   Do not eat food:After Midnight.  May have clear liquids: up to 4 Hours before arrival. (up to 1:30am)  Clear liquids include soda, tea, black coffee, apple or grape juice, broth.  Take these medicines the morning of surgery with A SIP OF WATER: amiodarone, hydrocodone, levothyroxine, lorazepam, metoprolol   Do not wear jewelry, make-up or nail polish.  Do not wear lotions, powders, or perfumes. You may wear deodorant.  Do not shave 48 hours prior to surgery.  Do not bring valuables to the hospital.  Contacts, dentures or bridgework may not be worn into surgery.  Leave suitcase in the car. After surgery it may be brought to your room.  For patients admitted to the hospital, checkout time is 11:00 AM the day of discharge.   Patients discharged the day of surgery will not be allowed to drive home.  Name and phone number of your driver: Bradd Merlos [phone]  Special Instructions: Incentive Spirometry - Practice and bring it with you on the day of surgery. and CHG Shower Use Special Wash: 1/2 bottle night before surgery and 1/2 bottle morning of surgery.   Please read over the following fact sheets that you were given: Pain Booklet, Coughing and Deep Breathing, Blood Transfusion Information, Open Heart Packet, MRSA Information and Surgical Site Infection Prevention,  Bring Cardiac Surgery Packet with you on the day of surgery.

## 2011-03-12 NOTE — Progress Notes (Signed)
Call to Resp. Dept., requested PFT results be faxed to [phone]

## 2011-03-12 NOTE — Progress Notes (Signed)
Approx 1400 received phone call from [place] in device clinic at [facility] stating ICD orders almost complete, will fax when complete. At 1630 fax had not been received, 1645 called office and Belenda Cruise being given message to call writer back with update.

## 2011-03-12 NOTE — Consult Note (Signed)
Anesthesia:  [name] is a 77 year old male scheduled for an AV Replacement secondary to severe AS.  Other history includes HLD, paroxysmal a-fib, hypothyroidism, CVA, sinus node dysfunction s/p Guidant PPM, and CAD s/p left CX stent.  His Cardiologist is Dr. Riley Kill.  His last echo on 01/29/11 showed (See Results Review tab):  - Left ventricle: The cavity size was mildly dilated. Wall thickness was increased in a pattern of mild LVH. The estimated ejection fraction was 60%. Wall motion was normal; there were no regional wall motion abnormalities. - Aortic valve: Calcified with aortic stenosis. The gradients obtained today are very similar to the study of 10/2010. There is one beat with slight increase in velocity at 3.37m/sec. Mean gradient: 31mm Hg (S). Peak gradient: 61mm Hg (S). - Mitral valve: Calcified annulus. - Left atrium: The atrium was moderately dilated. - Right ventricle: Pacer wire or catheter noted in right ventricle. - Right atrium: The atrium was mildly   Cath on 02/01/11 showed: 1. Severe aortic stenosis by echocardiography.  2. Continued patency of the circumflex stents.  3. Progressive angina pectoris with thyroid replacement therapy.  4. Scattered non critical CAD  His preoperative EKG showed a-paced rhythm with right BBB.  CXR showed no acute cardiopulmonary process.  Labs showed a decreased pO2 of 62.1, normal pH 7.39, pCO2 39.7, bicarb of 23..9. Thyroid studies showed an elevated TSH of 8.79, decreased T3 of 69.2, and normal T4 of 6.6.  PT/INR were elevated at 22.8/1.97.  H/H were decreased at 12.8/37.7.  I spoke with Sherre Poot, RN at [facility].  She will have Dr. Laneta Simmers review labs.  I will order a repeat PT/INR for the morning of surgery, but will defer additional orders to Dr. Laneta Simmers.    It is anticipated that Dr. Michelle Piper will be the assigned Anesthesiologist.

## 2011-03-12 NOTE — Progress Notes (Signed)
ICD orders received. Spoke to Joey at [phone]. Requests call DOS when pt goes to holding area.

## 2011-03-12 NOTE — Progress Notes (Signed)
Call to [facility], spoke with [facility], requested status report on order fr. Device clinic.  They agree to f/u when order is available.

## 2011-03-14 MED ORDER — DOPAMINE-DEXTROSE 3.2-5 MG/ML-% IV SOLN
2.0000 ug/kg/min | INTRAVENOUS | Status: DC
  Filled 2011-03-14: qty 250

## 2011-03-14 MED ORDER — CHLORHEXIDINE GLUCONATE 4 % EX LIQD: 30.0000 mL | CUTANEOUS | Status: DC

## 2011-03-14 MED ORDER — METOPROLOL TARTRATE 12.5 MG HALF TABLET: 12.5000 mg | ORAL_TABLET | Freq: Once | ORAL | Status: DC

## 2011-03-14 MED ORDER — SODIUM CHLORIDE 0.9 % IV SOLN
0.1000 ug/kg/h | INTRAVENOUS | Status: DC
Start: 2011-03-15 — End: 2011-03-15
  Filled 2011-03-14: qty 4

## 2011-03-14 MED ORDER — PHENYLEPHRINE HCL 10 MG/ML IJ SOLN
30.0000 ug/min | INTRAVENOUS | Status: DC
  Filled 2011-03-14: qty 2

## 2011-03-14 MED ORDER — POTASSIUM CHLORIDE 2 MEQ/ML IV SOLN
80.0000 meq | INTRAVENOUS | Status: DC
  Filled 2011-03-14: qty 40

## 2011-03-14 MED ORDER — VANCOMYCIN HCL 1000 MG IV SOLR
1500.0000 mg | INTRAVENOUS | Status: AC
  Administered 2011-03-15: 1500 mg via INTRAVENOUS
  Filled 2011-03-14: qty 1500

## 2011-03-14 MED ORDER — DEXTROSE 5 % IV SOLN
1.5000 g | INTRAVENOUS | Status: AC
  Administered 2011-03-15: .75 g via INTRAVENOUS
  Administered 2011-03-15: 1.5 g via INTRAVENOUS
  Filled 2011-03-14: qty 1.5

## 2011-03-14 MED ORDER — SODIUM CHLORIDE 0.9 % IV SOLN
INTRAVENOUS | Status: DC
  Filled 2011-03-14: qty 1

## 2011-03-14 MED ORDER — DEXTROSE 5 % IV SOLN
750.0000 mg | INTRAVENOUS | Status: DC
  Filled 2011-03-14: qty 750

## 2011-03-14 MED ORDER — SODIUM CHLORIDE 0.9 % IV SOLN
INTRAVENOUS | Status: DC
  Filled 2011-03-14: qty 40

## 2011-03-14 MED ORDER — EPINEPHRINE HCL 1 MG/ML IJ SOLN
0.5000 ug/min | INTRAVENOUS | Status: DC
  Filled 2011-03-14: qty 4

## 2011-03-14 MED ORDER — MAGNESIUM SULFATE 50 % IJ SOLN
40.0000 meq | INTRAMUSCULAR | Status: DC
  Filled 2011-03-14: qty 10

## 2011-03-14 MED ORDER — PAPAVERINE HCL 30 MG/ML IJ SOLN
INTRAMUSCULAR | Status: AC
  Administered 2011-03-15: 09:00:00
  Filled 2011-03-14: qty 0.5

## 2011-03-14 MED ORDER — NITROGLYCERIN IN D5W 200-5 MCG/ML-% IV SOLN
2.0000 ug/min | INTRAVENOUS | Status: DC
  Filled 2011-03-14: qty 250

## 2011-03-15 ENCOUNTER — Encounter (HOSPITAL_COMMUNITY): Payer: Self-pay | Admitting: Vascular Surgery

## 2011-03-15 ENCOUNTER — Ambulatory Visit (HOSPITAL_COMMUNITY): Payer: Medicare Other | Admitting: Vascular Surgery

## 2011-03-15 ENCOUNTER — Other Ambulatory Visit: Payer: Self-pay | Admitting: Surgery

## 2011-03-15 ENCOUNTER — Encounter (HOSPITAL_COMMUNITY)
Admission: RE | Disposition: A | Discharge status: Home / Self Care | Payer: Self-pay | Source: Ambulatory Visit | Attending: Surgery

## 2011-03-15 ENCOUNTER — Encounter (HOSPITAL_COMMUNITY): Payer: Self-pay

## 2011-03-15 ENCOUNTER — Inpatient Hospital Stay (HOSPITAL_COMMUNITY): Payer: Medicare Other

## 2011-03-15 ENCOUNTER — Inpatient Hospital Stay (HOSPITAL_COMMUNITY)
Admission: RE | Admit: 2011-03-15 | Discharge: 2011-03-21 | DRG: 220 | Disposition: A | Discharge status: Home / Self Care | Payer: Medicare Other | Source: Ambulatory Visit | Attending: Surgery | Admitting: Surgery

## 2011-03-15 DIAGNOSIS — E782 Mixed hyperlipidemia: Secondary | ICD-10-CM | POA: Diagnosis present

## 2011-03-15 DIAGNOSIS — I495 Sick sinus syndrome: Secondary | ICD-10-CM | POA: Diagnosis present

## 2011-03-15 DIAGNOSIS — I4892 Unspecified atrial flutter: Secondary | ICD-10-CM | POA: Diagnosis present

## 2011-03-15 DIAGNOSIS — Z91041 Radiographic dye allergy status: Secondary | ICD-10-CM

## 2011-03-15 DIAGNOSIS — Z950 Presence of cardiac pacemaker: Secondary | ICD-10-CM | POA: Diagnosis not present

## 2011-03-15 DIAGNOSIS — I2510 Atherosclerotic heart disease of native coronary artery without angina pectoris: Secondary | ICD-10-CM | POA: Diagnosis present

## 2011-03-15 DIAGNOSIS — D62 Acute posthemorrhagic anemia: Secondary | ICD-10-CM | POA: Diagnosis not present

## 2011-03-15 DIAGNOSIS — Z7982 Long term (current) use of aspirin: Secondary | ICD-10-CM

## 2011-03-15 DIAGNOSIS — I4891 Unspecified atrial fibrillation: Secondary | ICD-10-CM | POA: Diagnosis present

## 2011-03-15 DIAGNOSIS — I359 Nonrheumatic aortic valve disorder, unspecified: Principal | ICD-10-CM

## 2011-03-15 DIAGNOSIS — E039 Hypothyroidism, unspecified: Secondary | ICD-10-CM | POA: Diagnosis present

## 2011-03-15 DIAGNOSIS — Z9861 Coronary angioplasty status: Secondary | ICD-10-CM

## 2011-03-15 DIAGNOSIS — J90 Pleural effusion, not elsewhere classified: Secondary | ICD-10-CM | POA: Diagnosis not present

## 2011-03-15 DIAGNOSIS — Z7901 Long term (current) use of anticoagulants: Secondary | ICD-10-CM

## 2011-03-15 DIAGNOSIS — I6789 Other cerebrovascular disease: Secondary | ICD-10-CM | POA: Diagnosis not present

## 2011-03-15 DIAGNOSIS — Z8249 Family history of ischemic heart disease and other diseases of the circulatory system: Secondary | ICD-10-CM | POA: Diagnosis not present

## 2011-03-15 DIAGNOSIS — Z79899 Other long term (current) drug therapy: Secondary | ICD-10-CM | POA: Diagnosis not present

## 2011-03-15 DIAGNOSIS — R918 Other nonspecific abnormal finding of lung field: Secondary | ICD-10-CM | POA: Diagnosis not present

## 2011-03-15 DIAGNOSIS — J9819 Other pulmonary collapse: Secondary | ICD-10-CM | POA: Diagnosis not present

## 2011-03-15 DIAGNOSIS — J811 Chronic pulmonary edema: Secondary | ICD-10-CM | POA: Diagnosis not present

## 2011-03-15 DIAGNOSIS — I209 Angina pectoris, unspecified: Secondary | ICD-10-CM | POA: Diagnosis present

## 2011-03-15 DIAGNOSIS — Z954 Presence of other heart-valve replacement: Secondary | ICD-10-CM | POA: Diagnosis not present

## 2011-03-15 HISTORY — PX: MAZE: SHX5063

## 2011-03-15 HISTORY — PX: AORTIC VALVE REPLACEMENT: SHX41

## 2011-03-15 LAB — POCT I-STAT, CHEM 8
BUN: 9 mg/dL (ref 6–23)
Chloride: 106 mEq/L (ref 96–112)
Glucose, Bld: 150 mg/dL — ABNORMAL HIGH (ref 70–99)
HCT: 22 % — ABNORMAL LOW (ref 39.0–52.0)
Potassium: 4 mEq/L (ref 3.5–5.1)

## 2011-03-15 LAB — POCT I-STAT 3, ART BLOOD GAS (G3+)
Acid-base deficit: 1 mmol/L (ref 0.0–2.0)
Acid-base deficit: 5 mmol/L — ABNORMAL HIGH (ref 0.0–2.0)
Bicarbonate: 27.7 mEq/L — ABNORMAL HIGH (ref 20.0–24.0)
O2 Saturation: 99 %
O2 Saturation: 95 %
Patient temperature: 35.1
Patient temperature: 37
Patient temperature: 37.3
TCO2: 24 mmol/L (ref 0–100)
TCO2: 24 mmol/L (ref 0–100)
TCO2: 22 mmol/L (ref 0–100)
pCO2 arterial: 30.1 mmHg — ABNORMAL LOW (ref 35.0–45.0)
pCO2 arterial: 41.6 mmHg (ref 35.0–45.0)
pH, Arterial: 7.411 (ref 7.350–7.450)
pH, Arterial: 7.481 — ABNORMAL HIGH (ref 7.350–7.450)
pO2, Arterial: 261 mmHg — ABNORMAL HIGH (ref 80.0–100.0)

## 2011-03-15 LAB — POCT I-STAT 4, (NA,K, GLUC, HGB,HCT)
Glucose, Bld: 113 mg/dL — ABNORMAL HIGH (ref 70–99)
Glucose, Bld: 102 mg/dL — ABNORMAL HIGH (ref 70–99)
Glucose, Bld: 128 mg/dL — ABNORMAL HIGH (ref 70–99)
Glucose, Bld: 107 mg/dL — ABNORMAL HIGH (ref 70–99)
HCT: 31 % — ABNORMAL LOW (ref 39.0–52.0)
HCT: 24 % — ABNORMAL LOW (ref 39.0–52.0)
HCT: 29 % — ABNORMAL LOW (ref 39.0–52.0)
Hemoglobin: 9.9 g/dL — ABNORMAL LOW (ref 13.0–17.0)
Hemoglobin: 8.2 g/dL — ABNORMAL LOW (ref 13.0–17.0)
Hemoglobin: 8.2 g/dL — ABNORMAL LOW (ref 13.0–17.0)
Potassium: 2.7 mEq/L — CL (ref 3.5–5.1)
Potassium: 3.8 mEq/L (ref 3.5–5.1)
Potassium: 2.9 mEq/L — ABNORMAL LOW (ref 3.5–5.1)
Sodium: 140 mEq/L (ref 135–145)
Sodium: 134 mEq/L — ABNORMAL LOW (ref 135–145)
Sodium: 137 mEq/L (ref 135–145)

## 2011-03-15 LAB — CREATININE, SERUM
Creatinine, Ser: 0.95 mg/dL (ref 0.50–1.35)
GFR calc non Af Amer: 80 mL/min — ABNORMAL LOW (ref >90)

## 2011-03-15 LAB — GLUCOSE, CAPILLARY
Glucose-Capillary: 96 mg/dL (ref 70–99)
Glucose-Capillary: 105 mg/dL — ABNORMAL HIGH (ref 70–99)
Glucose-Capillary: 137 mg/dL — ABNORMAL HIGH (ref 70–99)
Glucose-Capillary: 157 mg/dL — ABNORMAL HIGH (ref 70–99)

## 2011-03-15 LAB — PROTIME-INR
Prothrombin Time: 15.2 seconds (ref 11.6–15.2)
Prothrombin Time: 21.7 seconds — ABNORMAL HIGH (ref 11.6–15.2)

## 2011-03-15 LAB — CBC
HCT: 29.6 % — ABNORMAL LOW (ref 39.0–52.0)
Hemoglobin: 10 g/dL — ABNORMAL LOW (ref 13.0–17.0)
MCH: 29.3 pg (ref 26.0–34.0)
MCHC: 33.8 g/dL (ref 30.0–36.0)
MCV: 86.8 fL (ref 78.0–100.0)
Platelets: 159 10*3/uL (ref 150–400)
RBC: 3.41 MIL/uL — ABNORMAL LOW (ref 4.22–5.81)
RDW: 13.8 % (ref 11.5–15.5)
WBC: 12.2 10*3/uL — ABNORMAL HIGH (ref 4.0–10.5)

## 2011-03-15 LAB — HEMOGLOBIN AND HEMATOCRIT, BLOOD
HCT: 23.9 % — ABNORMAL LOW (ref 39.0–52.0)
Hemoglobin: 8.2 g/dL — ABNORMAL LOW (ref 13.0–17.0)

## 2011-03-15 LAB — MAGNESIUM: Magnesium: 3 mg/dL — ABNORMAL HIGH (ref 1.5–2.5)

## 2011-03-15 SURGERY — REPLACEMENT, AORTIC VALVE, OPEN
Anesthesia: General | Site: Chest | Wound class: Clean

## 2011-03-15 MED ORDER — KETOROLAC TROMETHAMINE 30 MG/ML IJ SOLN
INTRAMUSCULAR | Status: AC
  Administered 2011-03-15: 15 mg
  Filled 2011-03-15: qty 1

## 2011-03-15 MED ORDER — THROMBIN 20000 UNITS EX KIT
PACK | OROMUCOSAL | Status: DC | PRN
  Administered 2011-03-15: 09:00:00 via TOPICAL

## 2011-03-15 MED ORDER — SODIUM CHLORIDE 0.9 % IV SOLN
1000.0000 mg | Freq: Once | INTRAVENOUS | Status: AC
Start: 2011-03-15 — End: 2011-03-15
  Administered 2011-03-15: 1000 mg via INTRAVENOUS
  Filled 2011-03-15: qty 1000

## 2011-03-15 MED ORDER — METOPROLOL TARTRATE 1 MG/ML IV SOLN: 2.5000 mg | INTRAVENOUS | Status: DC | PRN

## 2011-03-15 MED ORDER — INSULIN ASPART 100 UNIT/ML ~~LOC~~ SOLN
0.0000 [IU] | SUBCUTANEOUS | Status: DC
  Administered 2011-03-16: 2 [IU] via SUBCUTANEOUS

## 2011-03-15 MED ORDER — MIDAZOLAM HCL 2 MG/2ML IJ SOLN
2.0000 mg | INTRAMUSCULAR | Status: DC | PRN
  Administered 2011-03-16: 1 mg via INTRAVENOUS
  Filled 2011-03-15: qty 2

## 2011-03-15 MED ORDER — SODIUM CHLORIDE 0.45 % IV SOLN
INTRAVENOUS | Status: DC
  Administered 2011-03-15: 14:00:00 via INTRAVENOUS

## 2011-03-15 MED ORDER — PHENYLEPHRINE HCL 10 MG/ML IJ SOLN
0.0000 ug/min | INTRAVENOUS | Status: DC
  Administered 2011-03-15: 10 ug/min via INTRAVENOUS
  Filled 2011-03-15: qty 2

## 2011-03-15 MED ORDER — AMINOCAPROIC ACID 250 MG/ML IV SOLN: INTRAVENOUS | Status: DC | PRN

## 2011-03-15 MED ORDER — SODIUM CHLORIDE 0.9 % IJ SOLN: 3.0000 mL | INTRAMUSCULAR | Status: DC | PRN

## 2011-03-15 MED ORDER — SODIUM CHLORIDE 0.9 % IV SOLN: INTRAVENOUS | Status: DC

## 2011-03-15 MED ORDER — OXYCODONE HCL 5 MG PO TABS
5.0000 mg | ORAL_TABLET | ORAL | Status: DC | PRN
  Administered 2011-03-15 – 2011-03-16 (×6): 10 mg via ORAL
  Administered 2011-03-17 – 2011-03-18 (×6): 5 mg via ORAL
  Administered 2011-03-18: 10 mg via ORAL
  Administered 2011-03-18 (×3): 5 mg via ORAL
  Administered 2011-03-18 – 2011-03-19 (×2): 10 mg via ORAL
  Administered 2011-03-19: 5 mg via ORAL
  Administered 2011-03-19 – 2011-03-21 (×7): 10 mg via ORAL
  Filled 2011-03-15 (×4): qty 2
  Filled 2011-03-15: qty 1
  Filled 2011-03-15 (×4): qty 2
  Filled 2011-03-15 (×2): qty 1
  Filled 2011-03-15: qty 2
  Filled 2011-03-15: qty 1
  Filled 2011-03-15 (×2): qty 2
  Filled 2011-03-15 (×2): qty 1
  Filled 2011-03-15 (×2): qty 2
  Filled 2011-03-15: qty 1
  Filled 2011-03-15 (×3): qty 2
  Filled 2011-03-15 (×2): qty 1
  Filled 2011-03-15 (×2): qty 2

## 2011-03-15 MED ORDER — HEMOSTATIC AGENTS (NO CHARGE) OPTIME
TOPICAL | Status: DC | PRN
  Administered 2011-03-15: 1 via TOPICAL

## 2011-03-15 MED ORDER — INSULIN REGULAR BOLUS VIA INFUSION
0.0000 [IU] | Freq: Three times a day (TID) | INTRAVENOUS | Status: DC
  Filled 2011-03-15 (×5): qty 10

## 2011-03-15 MED ORDER — POTASSIUM CHLORIDE 10 MEQ/50ML IV SOLN
10.0000 meq | INTRAVENOUS | Status: AC
  Administered 2011-03-15 (×3): 10 meq via INTRAVENOUS

## 2011-03-15 MED ORDER — MORPHINE SULFATE 2 MG/ML IJ SOLN
1.0000 mg | INTRAMUSCULAR | Status: AC | PRN
  Administered 2011-03-15: 4 mg via INTRAVENOUS
  Filled 2011-03-15 (×2): qty 1

## 2011-03-15 MED ORDER — ACETAMINOPHEN 650 MG RE SUPP
650.0000 mg | RECTAL | Status: AC
Start: 2011-03-15 — End: 2011-03-15
  Administered 2011-03-15: 650 mg via RECTAL

## 2011-03-15 MED ORDER — PROPOFOL 10 MG/ML IV EMUL
INTRAVENOUS | Status: DC | PRN
  Administered 2011-03-15 (×2): 50 mg via INTRAVENOUS

## 2011-03-15 MED ORDER — MIDAZOLAM HCL 5 MG/5ML IJ SOLN
INTRAMUSCULAR | Status: DC | PRN
  Administered 2011-03-15: 3 mg via INTRAVENOUS
  Administered 2011-03-15: 2 mg via INTRAVENOUS
  Administered 2011-03-15: 3 mg via INTRAVENOUS
  Administered 2011-03-15: 2 mg via INTRAVENOUS

## 2011-03-15 MED ORDER — SODIUM CHLORIDE 0.9 % IV SOLN
100.0000 [IU] | INTRAVENOUS | Status: DC | PRN
  Administered 2011-03-15: 1.6 [IU]/h via INTRAVENOUS

## 2011-03-15 MED ORDER — MAGNESIUM SULFATE 40 MG/ML IJ SOLN
4.0000 g | Freq: Once | INTRAMUSCULAR | Status: AC
Start: 2011-03-15 — End: 2011-03-15
  Administered 2011-03-15: 4 g via INTRAVENOUS
  Filled 2011-03-15: qty 100

## 2011-03-15 MED ORDER — BISACODYL 10 MG RE SUPP: 10.0000 mg | Freq: Every day | RECTAL | Status: DC

## 2011-03-15 MED ORDER — ONDANSETRON HCL 4 MG/2ML IJ SOLN: 4.0000 mg | Freq: Four times a day (QID) | INTRAMUSCULAR | Status: DC | PRN

## 2011-03-15 MED ORDER — ACETAMINOPHEN 160 MG/5ML PO SOLN
975.0000 mg | Freq: Four times a day (QID) | ORAL | Status: DC
  Filled 2011-03-15: qty 40.6

## 2011-03-15 MED ORDER — SODIUM CHLORIDE 0.9 % IV SOLN: 250.0000 mL | INTRAVENOUS | Status: DC

## 2011-03-15 MED ORDER — AMIODARONE HCL 200 MG PO TABS
200.0000 mg | ORAL_TABLET | Freq: Every day | ORAL | Status: DC
  Administered 2011-03-16 – 2011-03-21 (×6): 200 mg via ORAL
  Filled 2011-03-15 (×6): qty 1

## 2011-03-15 MED ORDER — ACETAMINOPHEN 500 MG PO TABS
1000.0000 mg | ORAL_TABLET | Freq: Four times a day (QID) | ORAL | Status: DC
  Administered 2011-03-15 – 2011-03-18 (×11): 1000 mg via ORAL
  Filled 2011-03-15 (×15): qty 2

## 2011-03-15 MED ORDER — NITROGLYCERIN IN D5W 200-5 MCG/ML-% IV SOLN: 0.0000 ug/min | INTRAVENOUS | Status: DC

## 2011-03-15 MED ORDER — HETASTARCH-ELECTROLYTES 6 % IV SOLN
INTRAVENOUS | Status: DC | PRN
  Administered 2011-03-15: 13:00:00 via INTRAVENOUS

## 2011-03-15 MED ORDER — POTASSIUM CHLORIDE 10 MEQ/50ML IV SOLN
10.0000 meq | INTRAVENOUS | Status: AC
  Administered 2011-03-15 (×2): 10 meq via INTRAVENOUS

## 2011-03-15 MED ORDER — PROTAMINE SULFATE 10 MG/ML IV SOLN
INTRAVENOUS | Status: DC | PRN
  Administered 2011-03-15: 25 mg via INTRAVENOUS
  Administered 2011-03-15: 50 mg via INTRAVENOUS
  Administered 2011-03-15: 220 mg via INTRAVENOUS
  Administered 2011-03-15: 5 mg via INTRAVENOUS

## 2011-03-15 MED ORDER — SODIUM CHLORIDE 0.9 % IV SOLN
10.0000 g | INTRAVENOUS | Status: DC | PRN
  Administered 2011-03-15: 5 g/h via INTRAVENOUS

## 2011-03-15 MED ORDER — LEVOTHYROXINE SODIUM 50 MCG PO TABS
50.0000 ug | ORAL_TABLET | Freq: Every day | ORAL | Status: DC
  Administered 2011-03-16 – 2011-03-21 (×6): 50 ug via ORAL
  Filled 2011-03-15 (×7): qty 1

## 2011-03-15 MED ORDER — LACTATED RINGERS IV SOLN: INTRAVENOUS | Status: DC

## 2011-03-15 MED ORDER — LACTATED RINGERS IV SOLN: 500.0000 mL | Freq: Once | INTRAVENOUS | Status: AC | PRN

## 2011-03-15 MED ORDER — DOCUSATE SODIUM 100 MG PO CAPS
200.0000 mg | ORAL_CAPSULE | Freq: Every day | ORAL | Status: DC
  Administered 2011-03-16 – 2011-03-18 (×3): 200 mg via ORAL
  Filled 2011-03-15 (×3): qty 2

## 2011-03-15 MED ORDER — SODIUM CHLORIDE 0.9 % IV SOLN
INTRAVENOUS | Status: DC
  Filled 2011-03-15: qty 1

## 2011-03-15 MED ORDER — ALBUMIN HUMAN 5 % IV SOLN
250.0000 mL | INTRAVENOUS | Status: AC | PRN
  Administered 2011-03-15 (×2): 250 mL via INTRAVENOUS
  Filled 2011-03-15: qty 500

## 2011-03-15 MED ORDER — LACTATED RINGERS IV SOLN
INTRAVENOUS | Status: DC | PRN
  Administered 2011-03-15: 07:00:00 via INTRAVENOUS

## 2011-03-15 MED ORDER — ASPIRIN EC 325 MG PO TBEC
325.0000 mg | DELAYED_RELEASE_TABLET | Freq: Every day | ORAL | Status: DC
  Administered 2011-03-16 – 2011-03-18 (×3): 325 mg via ORAL
  Filled 2011-03-15 (×3): qty 1

## 2011-03-15 MED ORDER — MORPHINE SULFATE 4 MG/ML IJ SOLN
2.0000 mg | INTRAMUSCULAR | Status: DC | PRN
  Administered 2011-03-15 – 2011-03-17 (×5): 4 mg via INTRAVENOUS
  Administered 2011-03-17: 2 mg via INTRAVENOUS
  Administered 2011-03-17: 4 mg via INTRAVENOUS
  Administered 2011-03-17: 2 mg via INTRAVENOUS
  Administered 2011-03-17 – 2011-03-18 (×6): 4 mg via INTRAVENOUS
  Filled 2011-03-15 (×14): qty 1

## 2011-03-15 MED ORDER — LORAZEPAM 1 MG PO TABS
1.0000 mg | ORAL_TABLET | Freq: Every day | ORAL | Status: DC
  Administered 2011-03-16 – 2011-03-20 (×5): 1 mg via ORAL
  Filled 2011-03-15 (×4): qty 1

## 2011-03-15 MED ORDER — 0.9 % SODIUM CHLORIDE (POUR BTL) OPTIME
TOPICAL | Status: DC | PRN
  Administered 2011-03-15: 5000 mL

## 2011-03-15 MED ORDER — ACETAMINOPHEN 160 MG/5ML PO SOLN: 650.0000 mg | ORAL | Status: AC

## 2011-03-15 MED ORDER — ROCURONIUM BROMIDE 100 MG/10ML IV SOLN
INTRAVENOUS | Status: DC | PRN
  Administered 2011-03-15 (×3): 50 mg via INTRAVENOUS

## 2011-03-15 MED ORDER — DEXTROSE 5 % IV SOLN
1.5000 g | Freq: Two times a day (BID) | INTRAVENOUS | Status: AC
Start: 2011-03-15 — End: 2011-03-17
  Administered 2011-03-15 – 2011-03-17 (×4): 1.5 g via INTRAVENOUS
  Filled 2011-03-15 (×4): qty 1.5

## 2011-03-15 MED ORDER — ASPIRIN 81 MG PO CHEW: 324.0000 mg | CHEWABLE_TABLET | Freq: Every day | ORAL | Status: DC

## 2011-03-15 MED ORDER — SODIUM CHLORIDE 0.9 % IV SOLN
INTRAVENOUS | Status: DC | PRN
  Administered 2011-03-15 (×2): via INTRAVENOUS

## 2011-03-15 MED ORDER — KETOROLAC TROMETHAMINE 15 MG/ML IJ SOLN
15.0000 mg | Freq: Four times a day (QID) | INTRAMUSCULAR | Status: DC | PRN
  Administered 2011-03-16 (×2): 15 mg via INTRAVENOUS
  Filled 2011-03-15 (×2): qty 1

## 2011-03-15 MED ORDER — FAMOTIDINE IN NACL 20-0.9 MG/50ML-% IV SOLN
20.0000 mg | Freq: Two times a day (BID) | INTRAVENOUS | Status: AC
  Administered 2011-03-15: 20 mg via INTRAVENOUS

## 2011-03-15 MED ORDER — PHENYLEPHRINE HCL 10 MG/ML IJ SOLN
20.0000 mg | INTRAVENOUS | Status: DC | PRN
  Administered 2011-03-15: 25 ug/min via INTRAVENOUS

## 2011-03-15 MED ORDER — FENTANYL CITRATE 0.05 MG/ML IJ SOLN
INTRAMUSCULAR | Status: DC | PRN
  Administered 2011-03-15 (×2): 100 ug via INTRAVENOUS
  Administered 2011-03-15: 250 ug via INTRAVENOUS
  Administered 2011-03-15: 300 ug via INTRAVENOUS
  Administered 2011-03-15: 1000 ug via INTRAVENOUS

## 2011-03-15 MED ORDER — METOPROLOL TARTRATE 25 MG/10 ML ORAL SUSPENSION
12.5000 mg | Freq: Two times a day (BID) | ORAL | Status: DC
  Filled 2011-03-15 (×7): qty 5

## 2011-03-15 MED ORDER — PANTOPRAZOLE SODIUM 40 MG PO TBEC
40.0000 mg | DELAYED_RELEASE_TABLET | Freq: Every day | ORAL | Status: DC
  Administered 2011-03-17 – 2011-03-20 (×4): 40 mg via ORAL
  Filled 2011-03-15 (×4): qty 1

## 2011-03-15 MED ORDER — NITROGLYCERIN IN D5W 200-5 MCG/ML-% IV SOLN
INTRAVENOUS | Status: DC | PRN
  Administered 2011-03-15: 5 ug/min via INTRAVENOUS

## 2011-03-15 MED ORDER — BISACODYL 5 MG PO TBEC
10.0000 mg | DELAYED_RELEASE_TABLET | Freq: Every day | ORAL | Status: DC
  Administered 2011-03-16 – 2011-03-18 (×3): 10 mg via ORAL
  Filled 2011-03-15 (×3): qty 2

## 2011-03-15 MED ORDER — HEPARIN SODIUM (PORCINE) 1000 UNIT/ML IJ SOLN
INTRAMUSCULAR | Status: DC | PRN
  Administered 2011-03-15: 35000 [IU] via INTRAVENOUS

## 2011-03-15 MED ORDER — ROSUVASTATIN CALCIUM 20 MG PO TABS
20.0000 mg | ORAL_TABLET | Freq: Every day | ORAL | Status: DC
  Administered 2011-03-16 – 2011-03-21 (×6): 20 mg via ORAL
  Filled 2011-03-15 (×6): qty 1

## 2011-03-15 MED ORDER — SODIUM CHLORIDE 0.9 % IV SOLN
0.1000 ug/kg/h | INTRAVENOUS | Status: DC
  Filled 2011-03-15: qty 2

## 2011-03-15 MED ORDER — SODIUM CHLORIDE 0.9 % IV SOLN
200.0000 ug | INTRAVENOUS | Status: DC | PRN
  Administered 2011-03-15: 0.2 ug/kg/h via INTRAVENOUS

## 2011-03-15 MED ORDER — SODIUM CHLORIDE 0.9 % IJ SOLN
3.0000 mL | Freq: Two times a day (BID) | INTRAMUSCULAR | Status: DC
  Administered 2011-03-16 – 2011-03-18 (×5): 3 mL via INTRAVENOUS

## 2011-03-15 MED ORDER — LACTATED RINGERS IV SOLN
INTRAVENOUS | Status: DC | PRN
  Administered 2011-03-15 (×3): via INTRAVENOUS

## 2011-03-15 MED ORDER — METOPROLOL TARTRATE 12.5 MG HALF TABLET
12.5000 mg | ORAL_TABLET | Freq: Two times a day (BID) | ORAL | Status: DC
  Administered 2011-03-16: 12.5 mg via ORAL
  Filled 2011-03-15 (×7): qty 1

## 2011-03-15 MED ORDER — 0.9 % SODIUM CHLORIDE (POUR BTL) OPTIME
TOPICAL | Status: DC | PRN
  Administered 2011-03-15: 1000 mL

## 2011-03-15 MED ORDER — INSULIN ASPART 100 UNIT/ML ~~LOC~~ SOLN
0.0000 [IU] | SUBCUTANEOUS | Status: AC
  Administered 2011-03-15 (×2): 2 [IU] via SUBCUTANEOUS
  Filled 2011-03-15 (×2): qty 3

## 2011-03-15 SURGICAL SUPPLY — 92 items
ADAPTER CARDIO PERF ANTE/RETRO (ADAPTER) ×3 IMPLANT
ADPR PRFSN 84XANTGRD RTRGD (ADAPTER) ×2
ATTRACTOMAT 16X20 MAGNETIC DRP (DRAPES) ×3 IMPLANT
BAG DECANTER FOR FLEXI CONT (MISCELLANEOUS) ×3 IMPLANT
BLADE SAW STERNAL (BLADE) ×3 IMPLANT
BLADE SURG 15 STRL LF DISP TIS (BLADE) ×2 IMPLANT
BLADE SURG 15 STRL SS (BLADE) ×3
CANISTER SUCTION 2500CC (MISCELLANEOUS) ×3 IMPLANT
CANN PRFSN 3/8XCNCT ST RT ANG (MISCELLANEOUS) ×2
CANNULA ARTERIAL NVNT 3/8 22FR (MISCELLANEOUS) ×1 IMPLANT
CANNULA GUNDRY RCSP 15FR (MISCELLANEOUS) ×3 IMPLANT
CANNULA PRFSN 3/8XCNCT RT ANG (MISCELLANEOUS) IMPLANT
CANNULA RT ANGLE VENOUS 36FR (CANNULA) IMPLANT
CANNULA SOFTFLOW AORTIC 8M24FR (CANNULA) ×1 IMPLANT
CANNULA VEN MTL TIP RT (MISCELLANEOUS) ×3
CANNULA VENOUS MAL SGL STG 40 (MISCELLANEOUS) IMPLANT
CANNULAE RT ANGLE VENOUS 36FR (CANNULA) ×3
CANNULAE VENOUS MAL SGL STG 40 (MISCELLANEOUS) ×3
CARDIOBLATE CARDIAC ABLATION (MISCELLANEOUS)
CATH ROBINSON RED A/P 18FR (CATHETERS) ×10 IMPLANT
CATH THORACIC 36FR (CATHETERS) ×3 IMPLANT
CATH THORACIC 36FR RT ANG (CATHETERS) ×3 IMPLANT
CLOTH BEACON ORANGE TIMEOUT ST (SAFETY) ×3 IMPLANT
CONT SPEC STER OR (MISCELLANEOUS) ×3 IMPLANT
COVER MAYO STAND STRL (DRAPES) ×1 IMPLANT
COVER SURGICAL LIGHT HANDLE (MISCELLANEOUS) ×6 IMPLANT
CRADLE DONUT ADULT HEAD (MISCELLANEOUS) ×3 IMPLANT
DEVICE CARDIOBLATE CARDIAC ABL (MISCELLANEOUS) IMPLANT
DRAPE SLUSH/WARMER DISC (DRAPES) ×3 IMPLANT
DRSG COVADERM 4X14 (GAUZE/BANDAGES/DRESSINGS) ×3 IMPLANT
ELECT CAUTERY BLADE 6.4 (BLADE) ×3 IMPLANT
ELECT REM PT RETURN 9FT ADLT (ELECTROSURGICAL) ×6
ELECTRODE REM PT RTRN 9FT ADLT (ELECTROSURGICAL) ×4 IMPLANT
GLOVE BIO SURGEON STRL SZ 6 (GLOVE) IMPLANT
GLOVE BIO SURGEON STRL SZ 6.5 (GLOVE) ×6 IMPLANT
GLOVE BIO SURGEON STRL SZ7 (GLOVE) IMPLANT
GLOVE BIO SURGEON STRL SZ7.5 (GLOVE) ×2 IMPLANT
GLOVE BIOGEL PI IND STRL 6.5 (GLOVE) IMPLANT
GLOVE BIOGEL PI IND STRL 7.0 (GLOVE) IMPLANT
GLOVE BIOGEL PI INDICATOR 6.5 (GLOVE) ×4
GLOVE BIOGEL PI INDICATOR 7.0 (GLOVE) ×3
GLOVE EUDERMIC 7 POWDERFREE (GLOVE) ×6 IMPLANT
GOWN PREVENTION PLUS XLARGE (GOWN DISPOSABLE) ×6 IMPLANT
GOWN STRL NON-REIN LRG LVL3 (GOWN DISPOSABLE) ×14 IMPLANT
HEART VENT LT CURVED (MISCELLANEOUS) ×3 IMPLANT
HEMOSTAT POWDER SURGIFOAM 1G (HEMOSTASIS) ×9 IMPLANT
HEMOSTAT SURGICEL 2X14 (HEMOSTASIS) ×3 IMPLANT
INSERT FOGARTY XLG (MISCELLANEOUS) IMPLANT
KIT BASIN OR (CUSTOM PROCEDURE TRAY) ×3 IMPLANT
KIT CATH CPB BARTLE (MISCELLANEOUS) ×3 IMPLANT
KIT ROOM TURNOVER OR (KITS) ×3 IMPLANT
KIT SUCTION CATH 14FR (SUCTIONS) ×3 IMPLANT
LINE EXTENSION DELIVERY (MISCELLANEOUS) ×1 IMPLANT
LINE VENT (MISCELLANEOUS) ×1 IMPLANT
LOOP VESSEL SUPERMAXI WHITE (MISCELLANEOUS) ×1 IMPLANT
NS IRRIG 1000ML POUR BTL (IV SOLUTION) ×16 IMPLANT
PACK OPEN HEART (CUSTOM PROCEDURE TRAY) ×3 IMPLANT
PAD ARMBOARD 7.5X6 YLW CONV (MISCELLANEOUS) ×6 IMPLANT
PROBE CRYO2-ABLATION MALLABLE (MISCELLANEOUS) ×1 IMPLANT
SET CARDIOPLEGIA MPS 5001102 (MISCELLANEOUS) ×1 IMPLANT
SPONGE GAUZE 4X4 12PLY (GAUZE/BANDAGES/DRESSINGS) ×3 IMPLANT
SPONGE GAUZE 4X4 FOR O.R. (GAUZE/BANDAGES/DRESSINGS) ×1 IMPLANT
SPONGE LAP 18X18 X RAY DECT (DISPOSABLE) ×2 IMPLANT
SPONGE LAP 4X18 X RAY DECT (DISPOSABLE) ×3 IMPLANT
SUCKER INTRACARDIAC WEIGHTED (SUCKER) ×1 IMPLANT
SUT BONE WAX W31G (SUTURE) ×3 IMPLANT
SUT ETHIBON 2 0 V 52N 30 (SUTURE) ×6 IMPLANT
SUT ETHIBON EXCEL 2-0 V-5 (SUTURE) IMPLANT
SUT ETHIBOND 2 0 SH (SUTURE) ×3
SUT ETHIBOND 2 0 SH 36X2 (SUTURE) IMPLANT
SUT ETHIBOND 2 0 V4 (SUTURE) IMPLANT
SUT ETHIBOND 2 0V4 GREEN (SUTURE) IMPLANT
SUT ETHIBOND 4 0 RB 1 (SUTURE) IMPLANT
SUT ETHIBOND V-5 VALVE (SUTURE) IMPLANT
SUT PROLENE 3 0 SH 1 (SUTURE) ×3 IMPLANT
SUT PROLENE 3 0 SH DA (SUTURE) IMPLANT
SUT PROLENE 4 0 RB 1 (SUTURE) ×15
SUT PROLENE 4-0 RB1 .5 CRCL 36 (SUTURE) ×6 IMPLANT
SUT STEEL 6MS V (SUTURE) IMPLANT
SUT STEEL SZ 6 DBL 3X14 BALL (SUTURE) ×3 IMPLANT
SUT VIC AB 1 CTX 36 (SUTURE) ×6
SUT VIC AB 1 CTX36XBRD ANBCTR (SUTURE) ×4 IMPLANT
SUTURE E-PAK OPEN HEART (SUTURE) ×3 IMPLANT
SYS ATRICLIP LAA EXCLUSION 45 (CLIP) IMPLANT
SYSTEM SAHARA CHEST DRAIN ATS (WOUND CARE) ×3 IMPLANT
TOWEL OR 17X24 6PK STRL BLUE (TOWEL DISPOSABLE) ×3 IMPLANT
TOWEL OR 17X26 10 PK STRL BLUE (TOWEL DISPOSABLE) ×4 IMPLANT
TRAY FOLEY IC TEMP SENS 14FR (CATHETERS) ×3 IMPLANT
TUBE SUCT INTRACARD DLP 20F (MISCELLANEOUS) ×3 IMPLANT
UNDERPAD 30X30 INCONTINENT (UNDERPADS AND DIAPERS) ×3 IMPLANT
VALVE MAGNA EASE AORTIC 25MM (Prosthesis & Implant Heart) ×1 IMPLANT
WATER STERILE IRR 1000ML POUR (IV SOLUTION) ×6 IMPLANT

## 2011-03-15 NOTE — Brief Op Note (Signed)
                   [address]            [place] [address]          [phone]    03/15/2011  11:03 AM  PATIENT:  [name]  77 y.o. male  PRE-OPERATIVE DIAGNOSIS:  Aortic stenosis; atrial fibrillation  POST-OPERATIVE DIAGNOSIS:  Aortic stenosis; atrial fibrillation  PROCEDURE:  Procedure(s): AORTIC VALVE REPLACEMENT (AVR) with #25 Magna Ease pericardial  Left MAZE/ligation Left atrial appendage  SURGEON:  Surgeon(s): Alleen Borne, MD ASSISTANT: Sheliah Plane MD PHYSICIAN ASSISTANT: Gershon Crane PA-C  ANESTHESIA:   general  PATIENT CONDITION:  ICU - intubated and hemodynamically stable.  PRE-OPERATIVE WEIGHT: 102kg  COMPLICATIONS: No Known

## 2011-03-15 NOTE — Progress Notes (Signed)
Echocardiogram 2D Echocardiogram has been performed.  [name] 03/15/2011, 9:33 AM

## 2011-03-15 NOTE — OR Nursing (Signed)
12:05 called vol. Desk to inform family off pump, 1st call made to SICU.  12:40pm 2nd call made to SICU.

## 2011-03-15 NOTE — Transfer of Care (Signed)
Immediate Anesthesia Transfer of Care Note  Patient: [name]  Procedure(s) Performed:  AORTIC VALVE REPLACEMENT (AVR); MAZE  Patient Location: SICU  Anesthesia Type: General  Level of Consciousness: sedated and unresponsive  Airway & Oxygen Therapy: Patient remains intubated per anesthesia plan and Patient placed on Ventilator (see vital sign flow sheet for setting)  Post-op Assessment: Report given to PACU RN and Post -op Vital signs reviewed and stable  Post vital signs: Reviewed and stable Filed Vitals:   03/15/11 0544  BP: 161/82  Pulse: 58  Temp: 36.6 C  Resp: 20    Complications: No apparent anesthesia complications

## 2011-03-15 NOTE — H&P (Signed)
[address]            [place] [address]          [phone]       [address]  [place] [address]  [phone]  PCP is Rogelia Boga, MD  Referring Provider is Shawnie Pons, MD  Chief Complaint   Patient presents with   .  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 He also has a history of atrial fibrillation and has been maintained on amiodarone and Coumadin. He subsequently developed hypothyroidism related to the amiodarone and was started on replacement therapy. With replacement therapy he developed increased atrial arrhythmias and therefore stopped the replacement therapy himself last summer. He developed symptomatic hypothyroidism and therefore restarted the replacement therapy. Since that has been restarted he has developed increased angina. He describes this as being a squeezing pressure in his chest radiating up into his neck and jaw as well as into his left arm. This is related to exertion and has been occurring with less exertion. He is at the point where he can no longer walk his dog without getting chest discomfort. His most recent echocardiogram from 01/29/2011 showed severe aortic stenosis with a peak gradient of 61 mm mercury and a mean gradient of 31 mm mercury. His ejection fraction was 60% with mild left ventricular hypertrophy. His indexed aortic valve area was 0.4cm2/m2. This had not changed much from his previous echo in September of 2012. He subsequently underwent catheterization to rule out recurrent coronary stenosis as the cause of his increased anginal symptoms. Cardiac catheterization showed patent stents in the left circumflex coronary artery and other scattered noncritical coronary disease. His right heart pressures  were within normal limits with a cardiac index of 2.85.  Past Medical History   Diagnosis  Date   .  Paroxysmal atrial fibrillation    .  Chronotropic incompetence with sinus node dysfunction      Status post Guidant dual-mode, dual-pacing, dual-sensing pacemaker implantation now programmed to AAI with recent generator change.   .  Coronary artery disease      status post multiple prior percutaneous coronary interventions, microvascular angina per Dr [name]   .  Aortic stenosis      moderate aortic stenosis   .  Hyperlipidemia    .  Hypercoagulable state      chronically anticoagulated with coumadin    Past Surgical History   Procedure  Date   .  Pacemaker insertion  1991     Guidant PPM, most recent Generator Change by Dr [name] was 08/22/06    Family History   Problem  Relation  Age of Onset   .  Heart disease  Brother       Twin brother has coronary disease and recent AVR for AS   Social History  History   Substance Use Topics   .  Smoking status:  Never Smoker   .  Smokeless tobacco:  Not on file   .  Alcohol Use:  No    Current Outpatient Prescriptions   Medication  Sig  Dispense  Refill   .  HYDROcodone-acetaminophen (NORCO) 5-325 MG per tablet  Take  1 tablet by mouth 2 (two) times daily.     Marland Kitchen  levothyroxine (SYNTHROID, LEVOTHROID) 25 MCG tablet  Take 50 mcg by mouth daily.     Marland Kitchen  LORazepam (ATIVAN) 1 MG tablet  Take 1 mg by mouth once as needed.     Marland Kitchen  amiodarone (PACERONE) 200 MG tablet  Take 200 mg by mouth daily.     Marland Kitchen  aspirin 81 MG tablet  Take 81 mg by mouth daily.     Marland Kitchen  co-enzyme Q-10 30 MG capsule  Take 30 mg by mouth daily.     .  hydrochlorothiazide 25 MG tablet  Take 25 mg by mouth daily.     .  metoprolol (TOPROL-XL) 50 MG 24 hr tablet  Take 50 mg by mouth at bedtime.     .  potassium chloride SA (K-DUR,KLOR-CON) 20 MEQ tablet  Take 30 mEq by mouth daily.     .  rosuvastatin (CRESTOR) 40 MG tablet  Take 20 mg by mouth at bedtime.     .  Tamsulosin HCl  (FLOMAX) 0.4 MG CAPS  Take 0.4 mg by mouth at bedtime.     Marland Kitchen  warfarin (COUMADIN) 4 MG tablet  Take 4 mg by mouth as directed.      Allergies   Allergen  Reactions   .  Contrast Media (Iodinated Diagnostic Agents)  Hives   Review of Systems:  General: He denies fever or chills. He said no recent weight changes. He does report some fatigue.  Eyes: Negative  ENT: Negative. He does see a dentist regularly.  Endocrine: he denies diabetes. He has hypothyroidism.  Cardiac: He has exertional chest tightness and pressure radiating to his neck, jaw, and left arm. He has shortness of breath with exertion as well as occasionally at rest. He reports palpitations and always knows when he is in atrial fibrillation. He denies peripheral edema.  Respiratory: He denies cough and sputum production.  GI: He denies nausea vomiting. He has had no melena or bright red blood per rectum.  GU: He denies dysuria and hematuria.  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 He has no significant residual deficit. He denies any focal weakness or numbness. He denies dizziness and syncope.  Musculoskeletal: He denies arthralgias and myalgias.  BP 124/76  Pulse 60  Resp 18  Ht 6\' 2"  (1.88 m)  Wt 225 lb (102.059 kg)  BMI 28.89 kg/m2  SpO2 96%  Physical Exam:  He is a well-developed white male in no distress.  HEENT: Normocephalic and atraumatic. Pupils are equal and reactive to light. Extraocular muscles are intact. Oropharynx is clear. Teeth are in good condition.  Neck: Carotid pulses are palpable bilaterally. There no bruits. There is no adenopathy or thyromegaly.  Heart: Regular rate and rhythm with a grade 3/6 systolic murmur over the aorta.  Lungs: Clear  Abdomen: Bowel sounds are present. Soft and nontender. There are no palpable masses organomegaly.  Extremities: No peripheral edema.  Diagnostic Tests:    ------------------------------------------------------------ Transthoracic Echocardiography  Patient: Jose, Corvin MR #: [id number] Study Date: 01/29/2011 Gender: M Age: 69 Height: 188cm Weight: 100kg BSA: 2.20m^2 Pt. Status: Room:  ORDERING Shawnie Pons, MD REFERRING Shawnie Pons, MD SONOGRAPHER Luvenia Redden ATTENDING Willa Rough, MD, [facility] PERFORMING [facility], Site 3 cc:  ------------------------------------------------------------ LV EF: 60%  ------------------------------------------------------------ Indications: 424.1 Aortic valve disorders.  ------------------------------------------------------------ History: PMH: Acquired from the patient and from the  patient's chart. Atrial fibrillation. Atrial flutter. Coronary artery disease. Aortic valve disease. Risk factors: Hypertension. Dyslipidemia.  ------------------------------------------------------------ Study Conclusions  - Left ventricle: The cavity size was mildly dilated. Wall thickness was increased in a pattern of mild LVH. The estimated ejection fraction was 60%. Wall motion was normal; there were no regional wall motion abnormalities. - Aortic valve: Calcified with aortic stenosis. The gradients obtained today are very similar to the study of 10/2010. There is one beat with slight increase in velocity at 3.39m/sec. Mean gradient: 31mm Hg (S). Peak gradient: 61mm Hg (S). - Mitral valve: Calcified annulus. - Left atrium: The atrium was moderately dilated. - Right ventricle: Pacer wire or catheter noted in right ventricle. - Right atrium: The atrium was mildly dilated. Pacer wire or catheter noted in right atrium. Transthoracic echocardiography. M-mode, complete 2D, spectral Doppler, and color Doppler. Height: Height: 188cm. Height: 74in. Weight: Weight: 100kg. Weight: 220lb. Body mass index: BMI: 28.3kg/m^2. Body surface area: BSA: 2.66m^2. Patient status: Outpatient. Location: [facility]  Site 3  ------------------------------------------------------------  ------------------------------------------------------------ Left ventricle: The cavity size was mildly dilated. Wall thickness was increased in a pattern of mild LVH. The estimated ejection fraction was 60%. Wall motion was normal; there were no regional wall motion abnormalities.  ------------------------------------------------------------ Aortic valve: Calcified with aortic stenosis. The gradients obtained today are very similar to the study of 10/2010. There is one beat with slight increase in velocity at 3.52m/sec. Doppler: VTI ratio of LVOT to aortic valve: 0.2. Indexed valve area: 0.42cm^2/m^2 (VTI). Peak velocity ratio of LVOT to aortic valve: 0.18. Indexed valve area: 0.39cm^2/m^2 (Vmax). Mean gradient: 31mm Hg (S). Peak gradient: 61mm Hg (S).  ------------------------------------------------------------ Mitral valve: Calcified annulus. Doppler: No significant regurgitation.  ------------------------------------------------------------ Left atrium: The atrium was moderately dilated.  ------------------------------------------------------------ Right ventricle: The cavity size was normal. Pacer wire or catheter noted in right ventricle. Systolic function was normal.  ------------------------------------------------------------ Pulmonic valve: The valve appears to be grossly normal. Doppler: No significant regurgitation.  ------------------------------------------------------------ Tricuspid valve: Structurally normal valve. Leaflet separation was normal. Doppler: Transvalvular velocity was within the normal range. Mild regurgitation.  ------------------------------------------------------------ Right atrium: The atrium was mildly dilated. Pacer wire or catheter noted in right atrium.  ------------------------------------------------------------ Pericardium: There was no pericardial  effusion.  ------------------------------------------------------------  2D measurements Normal Doppler measurements Norma Left ventricle l LVID ED, 55.4 mm 43-52 Main pulmonary artery chord, Pressure, 23 mm Hg =30 PLAX S LVID ES, 36.1 mm 23-38 Left ventricle chord, Ea, lat 4.6 cm/s ----- PLAX ann, tiss 1 FS, chord, 35 % >29 DP PLAX E/Ea, lat 10. ----- LVPW, ED 11.2 mm ------ ann, tiss 17 IVS/LVPW 1.03 <1.3 DP ratio, ED Ea, med 5.9 cm/s ----- Ventricular septum ann, tiss 2 IVS, ED 11.5 mm ------ DP LVOT E/Ea, med 7.9 ----- Diam, S 25 mm ------ ann, tiss 2 Area 4.91 cm^2 ------ DP Diam 25 mm ------ LVOT Aorta Peak vel, 71. cm/s ----- Root diam, 37 mm ------ S 4 ED VTI, S 20. cm ----- Left atrium 2 AP dim 52 mm ------ HR 60 bpm ----- AP dim 2.26 cm/m^2 <2.2 Stroke vol 99. ml ----- index 2 Cardiac 5.9 L/min ----- output Cardiac 2.6 L/(min-m ----- index ^2) Stroke 43. ml/m^2 ----- index 1 Aortic valve Peak vel, 389 cm/s ----- S Mean vel, 243 cm/s ----- S VTI, S 103 cm ----- Mean 31 mm Hg ----- gradient, S Peak 61 mm Hg ----- gradient, S VTI ratio 0.2 ----- LVOT/AV Area index 0.4 cm^2/m^2 ----- (VTI) 2  Peak vel 0.1 ----- ratio, 8 LVOT/AV Area index 0.3 cm^2/m^2 ----- (Vmax) 9 Regurg PHT 342 ms ----- Mitral valve Peak E vel 46. cm/s ----- 9 Peak A vel 72. cm/s ----- 1 Decelerati 197 ms 150-2 on time 30 Peak E/A 0.7 ----- ratio Tricuspid valve Regurg 213 cm/s ----- peak vel Peak RV-RA 18 mm Hg ----- gradient, S Systemic veins Estimated 5 mm Hg ----- CVP Right ventricle Pressure, 23 mm Hg <30 S Sa vel, 10. cm/s ----- lat ann, 9 tiss DP  ------------------------------------------------------------ Prepared and Electronically Authenticated by  Willa Rough, MD, [facility] 2012-11-30T15:48:50.990  Procedural Findings:  Hemodynamics  RA 7/6/5  RV 26/5  PA 25/13  PCWP 11  LV Not done  AO 147/82 (106)  Oxygen saturations:  PA 69%  AO 96%   Cardiac Output (Fick) 6.6 L/min  Cardiac Index (Fick) 2.85 L/min/m2  Thermodilution CO: 5.46 L/min  Thermodilution Index: 2.37 L/min/M2  Coronary angiography:  Coronary dominance: right  Left mainstem: The left main coronary artery was a large-caliber vessel that was free of significant disease.  LAD: The left anterior descending artery courses to the cardiac apex there is calcification and mild luminal irregularity throughout the first septal perforator has about 50% narrowing and there is a second septal perforator that has approximately 80-90% narrowing but is a small caliber vessel. The distal left anterior descending artery wraps the apex and provides twin apical vessels. Other than luminal regularity is free of critical disease.  Left Circumflex: The circumflex coronary artery provides a ramus intermedius and a large AV circumflex that supplies a marginal branch. The ramus intermedius bifurcates into superior branch has approximately 40-50% narrowing but supplies a moderate size distal vessel the inferior subbranch appears to be widely patent. The AV portion of the circumflex coronary artery supplies a large marginal branch and has been previously stented. The stents are widely patent.  Right coronary artery (RCA): The right coronary artery is calcified there is moderate calcification in the mid vessel near the acute margin his knee centric 50% area of stenosis, but there is excellent flow through this territory. There is mild luminal irregularity in the distal right coronary. This supplies a posterior descending and posterolateral branches which are small and free of significant disease.  Left ventriculography: Not done  Final Conclusions:  1. Severe aortic stenosis by echocardiography.  2. Continued patency of the circumflex stents.  3. Progressive angina pectoris with thyroid replacement therapy.  4. 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 He has no significant coronary disease at this time and patent stents in his left circumflex coronary artery. He has a diagnosis of syndrome X with microvascular angina which may explain why he has such disabling angina with his aortic stenosis. He also has a history of paroxysmal atrial fibrillation that has been somewhat difficult to control and very symptomatic. He can feel every irregular beat. I would recommend proceeding with aortic valve replacement and possibly a left-sided Maze procedure. He has a permanent dual-chamber pacemaker and therefore I don't think a right-sided Maze procedure would be possible. I recommended using a tissue valve given his age even though he has been maintained on Coumadin for his atrial fibrillation. A bovine pericardial valve would have excellent longevity in his age group and would obviate the need for lifelong anticoagulation with Coumadin if it needed to be stopped for some other reason. I discussed the operative procedure with the patient and his  wife including alternatives, benefits and risks; including but not limited to bleeding, blood transfusion, infection, stroke, myocardial infarction, heart block requiring a permanent pacemaker, possible malfunction of his permanent pacer requiring revision, organ dysfunction, and death. Kerry Kass understands and agrees to proceed. We will schedule surgery for early January at his request. I will discuss the potential benefits and risks of a left-sided Maze procedure with him prior to that time.  Plan:  AVR  And Left-sided MAZE.

## 2011-03-15 NOTE — Anesthesia Preprocedure Evaluation (Addendum)
Anesthesia Evaluation  Patient identified by MRN, date of birth, ID band Patient awake    Reviewed: Allergy & Precautions, H&P , NPO status , Patient's Chart, lab work & pertinent test results  Airway Mallampati: II TM Distance: >3 FB     Dental  (+) Teeth Intact and Caps   Pulmonary    Pulmonary exam normal       Cardiovascular hypertension, Pt. on medications and Pt. on home beta blockers + angina with exertion + CAD + dysrhythmias Atrial Fibrillation + Valvular Problems/Murmurs AS Irregular Abnormal+ Carotid Bruit    Neuro/Psych Anxiety CVA    GI/Hepatic   Endo/Other  Hypothyroidism   Renal/GU      Musculoskeletal   Abdominal   Peds  Hematology   Anesthesia Other Findings   Reproductive/Obstetrics                         Anesthesia Physical Anesthesia Plan  ASA: III  Anesthesia Plan: General   Post-op Pain Management:    Induction: Intravenous  Airway Management Planned: Oral ETT  Additional Equipment: TEE, PA Cath, CVP, 3D TEE, Arterial line and Ultrasound Guidance Line Placement  Intra-op Plan:   Post-operative Plan: Post-operative intubation/ventilation  Informed Consent: I have reviewed the patients History and Physical, chart, labs and discussed the procedure including the risks, benefits and alternatives for the proposed anesthesia with the patient or authorized representative who has indicated his/her understanding and acceptance.   Dental advisory given  Plan Discussed with: CRNA and Surgeon  Anesthesia Plan Comments:        Anesthesia Quick Evaluation

## 2011-03-15 NOTE — Procedures (Signed)
Extubation Procedure Note  Patient Details:   Name: [name] DOB: 07/25/1934 MRN: [id number]   Airway Documentation:  Airway 8 mm (Active)  Secured at (cm) 23 cm 03/15/2011  1:17 PM  Measured From Lips 03/15/2011  1:17 PM  Secured Location Right 03/15/2011  1:17 PM  Secured By Pink Tape 03/15/2011  1:17 PM  Site Condition Dry 03/15/2011  1:17 PM    Evaluation  O2 sats: 100% Complications: No apparent complications Patient did tolerate procedure well. Bilateral Breath Sounds: Clear;Diminished Suctioning: Oral Yes TIME OUT WAS PERFORMED. PATIENT DID VERY WELL WITH FOLLOWING COMMANDS. PLACED PATIENT ON 4L OF OXYGEN PER PROTOCOL.  Jeoffrey Massed, Lendell Caprice 03/15/2011, 4:56 PM

## 2011-03-15 NOTE — Anesthesia Procedure Notes (Signed)
Procedure Name: Intubation Date/Time: 03/15/2011 7:55 AM Performed by: Tyrone Nine Pre-anesthesia Checklist: Patient identified, Emergency Drugs available, Suction available and Patient being monitored Patient Re-evaluated:Patient Re-evaluated prior to inductionOxygen Delivery Method: Circle System Utilized Preoxygenation: Pre-oxygenation with 100% oxygen Intubation Type: IV induction Ventilation: Mask ventilation without difficulty Number of attempts: 1 Airway Equipment and Method: stylet Placement Confirmation: ETT inserted through vocal cords under direct vision,  positive ETCO2 and CO2 detector Secured at: 23 cm Tube secured with: Tape Dental Injury: Teeth and Oropharynx as per pre-operative assessment

## 2011-03-15 NOTE — Preoperative (Signed)
Beta Blockers   04:15 Metoprolol this am

## 2011-03-15 NOTE — Interval H&P Note (Signed)
History and Physical Interval Note:  03/15/2011 7:35 AM  [name]  has presented today for surgery, with the diagnosis of AORTIC STENOSIS  The various methods of treatment have been discussed with the patient and family. After consideration of risks, benefits and other options for treatment, the patient has consented to  Procedure(s): AORTIC VALVE REPLACEMENT (AVR) MAZE as a surgical intervention .  The patients' history has been reviewed, patient examined, no change in status, stable for surgery.  I have reviewed the patients' chart and labs.  Questions were answered to the patient's satisfaction.     Alleen Borne

## 2011-03-15 NOTE — Progress Notes (Signed)
Pt extubated by RT per MD order/protocol.  ABG within normal limits prior to extubation.  Patient placed on 3 L [place]ival and vital signs remain stable.  Pt alert and oriented post extubation.  Nursing will continue to monitor.

## 2011-03-15 NOTE — Progress Notes (Signed)
Patient ID: [name], male   DOB: [birth year], 77 y.o.   MRN: [id number] S/P AVR, maze Extubated Good hemodynamics BP 85/60  Pulse 79  Temp(Src) 98.8 F (37.1 C) (Oral)  Resp 16  Wt 225 lb 1.4 oz (102.1 kg)  SpO2 99% Minimal CT output Continue present care

## 2011-03-16 ENCOUNTER — Inpatient Hospital Stay (HOSPITAL_COMMUNITY): Payer: Medicare Other

## 2011-03-16 ENCOUNTER — Encounter (HOSPITAL_COMMUNITY): Payer: Self-pay | Admitting: Surgery

## 2011-03-16 ENCOUNTER — Other Ambulatory Visit: Payer: Self-pay

## 2011-03-16 LAB — POCT I-STAT, CHEM 8
Creatinine, Ser: 1.2 mg/dL (ref 0.50–1.35)
HCT: 21 % — ABNORMAL LOW (ref 39.0–52.0)
Hemoglobin: 7.1 g/dL — ABNORMAL LOW (ref 13.0–17.0)
Sodium: 137 mEq/L (ref 135–145)
TCO2: 25 mmol/L (ref 0–100)

## 2011-03-16 LAB — CBC
MCH: 29.5 pg (ref 26.0–34.0)
MCH: 29.8 pg (ref 26.0–34.0)
MCV: 87.8 fL (ref 78.0–100.0)
Platelets: 164 10*3/uL (ref 150–400)
Platelets: 116 10*3/uL — ABNORMAL LOW (ref 150–400)
RBC: 2.78 MIL/uL — ABNORMAL LOW (ref 4.22–5.81)
RBC: 2.58 MIL/uL — ABNORMAL LOW (ref 4.22–5.81)
RDW: 14 % (ref 11.5–15.5)
WBC: 9.2 10*3/uL (ref 4.0–10.5)

## 2011-03-16 LAB — BASIC METABOLIC PANEL
CO2: 24 mEq/L (ref 19–32)
Calcium: 7.2 mg/dL — ABNORMAL LOW (ref 8.4–10.5)
Chloride: 105 mEq/L (ref 96–112)
Creatinine, Ser: 0.97 mg/dL (ref 0.50–1.35)
GFR calc Af Amer: >90 mL/min (ref >90)
Sodium: 138 mEq/L (ref 135–145)

## 2011-03-16 LAB — MAGNESIUM
Magnesium: 2.6 mg/dL — ABNORMAL HIGH (ref 1.5–2.5)
Magnesium: 1.4 mg/dL — ABNORMAL LOW (ref 1.5–2.5)

## 2011-03-16 LAB — GLUCOSE, CAPILLARY
Glucose-Capillary: 123 mg/dL — ABNORMAL HIGH (ref 70–99)
Glucose-Capillary: 157 mg/dL — ABNORMAL HIGH (ref 70–99)

## 2011-03-16 LAB — CREATININE, SERUM: Creatinine, Ser: 1.12 mg/dL (ref 0.50–1.35)

## 2011-03-16 MED ORDER — POTASSIUM CHLORIDE CRYS ER 20 MEQ PO TBCR
40.0000 meq | EXTENDED_RELEASE_TABLET | Freq: Once | ORAL | Status: AC
Start: 2011-03-16 — End: 2011-03-16
  Administered 2011-03-16: 40 meq via ORAL
  Filled 2011-03-16: qty 2

## 2011-03-16 MED ORDER — TAMSULOSIN HCL 0.4 MG PO CAPS
0.4000 mg | ORAL_CAPSULE | Freq: Every day | ORAL | Status: DC
  Administered 2011-03-16 – 2011-03-20 (×5): 0.4 mg via ORAL
  Filled 2011-03-16 (×6): qty 1

## 2011-03-16 MED ORDER — ENOXAPARIN SODIUM 40 MG/0.4ML ~~LOC~~ SOLN
40.0000 mg | Freq: Every day | SUBCUTANEOUS | Status: DC
  Administered 2011-03-16 – 2011-03-20 (×5): 40 mg via SUBCUTANEOUS
  Filled 2011-03-16 (×6): qty 0.4

## 2011-03-16 MED ORDER — INSULIN ASPART 100 UNIT/ML ~~LOC~~ SOLN
0.0000 [IU] | SUBCUTANEOUS | Status: DC
  Administered 2011-03-16 – 2011-03-17 (×5): 2 [IU] via SUBCUTANEOUS
  Administered 2011-03-17: 4 [IU] via SUBCUTANEOUS
  Administered 2011-03-17 (×2): 2 [IU] via SUBCUTANEOUS
  Administered 2011-03-17: 4 [IU] via SUBCUTANEOUS
  Administered 2011-03-18: 2 [IU] via SUBCUTANEOUS
  Administered 2011-03-18: 4 [IU] via SUBCUTANEOUS
  Administered 2011-03-18: 2 [IU] via SUBCUTANEOUS

## 2011-03-16 MED ORDER — FUROSEMIDE 10 MG/ML IJ SOLN
40.0000 mg | Freq: Once | INTRAMUSCULAR | Status: AC
  Administered 2011-03-16: 40 mg via INTRAVENOUS
  Filled 2011-03-16: qty 4

## 2011-03-16 MED ORDER — POTASSIUM CHLORIDE 10 MEQ/50ML IV SOLN
INTRAVENOUS | Status: AC
  Administered 2011-03-16: 10 meq via INTRAVENOUS
  Filled 2011-03-16: qty 50

## 2011-03-16 MED ORDER — POTASSIUM CHLORIDE 10 MEQ/50ML IV SOLN
10.0000 meq | INTRAVENOUS | Status: AC | PRN
  Administered 2011-03-16 (×3): 10 meq via INTRAVENOUS
  Filled 2011-03-16: qty 100

## 2011-03-16 MED FILL — Dexmedetomidine HCl IV Soln 200 MCG/2ML: INTRAVENOUS | Qty: 2 | Status: AC

## 2011-03-16 MED FILL — Magnesium Sulfate Inj 50%: INTRAMUSCULAR | Qty: 10 | Status: AC

## 2011-03-16 MED FILL — Potassium Chloride Inj 2 mEq/ML: INTRAVENOUS | Qty: 40 | Status: AC

## 2011-03-16 NOTE — Progress Notes (Signed)
Patient ID: [name], male   DOB: [birth year], 77 y.o.   MRN: [id number] Filed Vitals:   03/16/11 1700 03/16/11 1800 03/16/11 1900 03/16/11 2000  BP: 104/49 125/65 94/54   Pulse: 80  81   Temp:    98.5 F (36.9 C)  TempSrc:    Oral  Resp: 15 22 16    Weight:      SpO2: 96% 92% 97%    Had a good day. Ambulated around ICU twice.  CBC    Component Value Date/Time   WBC 8.9 03/16/2011 1700   RBC 2.58* 03/16/2011 1700   HGB 7.1* 03/16/2011 1741   HCT 21.0* 03/16/2011 1741   PLT 116* 03/16/2011 1700   MCV 88.0 03/16/2011 1700   MCH 29.8 03/16/2011 1700   MCHC 33.9 03/16/2011 1700   RDW 14.3 03/16/2011 1700   LYMPHSABS 0.9 01/29/2011 0903   MONOABS 0.4 01/29/2011 0903   EOSABS 0.2 01/29/2011 0903   BASOSABS 0.0 01/29/2011 0903    BMET    Component Value Date/Time   NA 137 03/16/2011 1741   K 4.7 03/16/2011 1741   CL 100 03/16/2011 1741   CO2 24 03/16/2011 0410   GLUCOSE 134* 03/16/2011 1741   BUN 13 03/16/2011 1741   CREATININE 1.20 03/16/2011 1741   CREATININE 1.26 11/16/2010 1355   CALCIUM 7.2* 03/16/2011 0410   GFRNONAA 63* 03/16/2011 1700   GFRAA 73* 03/16/2011 1700     Will repeat Hgb in am. May need transfusion

## 2011-03-16 NOTE — Op Note (Signed)
NAMEENNIS, HEAVNER NO.:  [id number]  MEDICAL RECORD NO.:  [id number]  LOCATION:  2314                         FACILITY:  [facility]  PHYSICIAN:  Evelene Croon, M.D.     DATE OF BIRTH:  07-21-1934  DATE OF PROCEDURE:  03/15/2011 DATE OF DISCHARGE:                              OPERATIVE REPORT   PREOPERATIVE DIAGNOSES: 1. Severe aortic stenosis. 2. Paroxysmal atrial fibrillation.  POSTOPERATIVE DIAGNOSES: 1. Severe aortic stenosis. 2. Paroxysmal atrial fibrillation.  OPERATIVE PROCEDURE:  Median sternotomy, extracorporeal circulation, left-sided radiofrequency and cryotherapy Maze procedure, ligation of left atrial appendage, aortic valve replacement using a 25-mm Edwards Magna-Ease valve.  ATTENDING SURGEON:  Evelene Croon, MD.  ASSISTANT:  Sheliah Plane, MD  SECOND ASSISTANT:  Rowe Clack, PA-C  ANESTHESIA:  General endotracheal.  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  He also has a history of atrial fibrillation, has been maintained on amiodarone and Coumadin.  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  He has developed increasing angina that he describes as a squeezing pressure in his chest radiating up into his neck and jaw as well as into his left arm and specifically related to exertion.  This has been occurring with less exertion recently.  His most recent echocardiogram from January 29, 2011, shows severe aortic stenosis with a peak gradient of 61 and a mean gradient of 31.  Ejection fraction about 60% with mild left ventricular hypertrophy. His indexed aortic valve area was 0.4 cm square per meter square.  This had not changed much from his previous echo in  September 2012.  He subsequently underwent cardiac catheterizations to rule out recurrent coronary stenosis as a cause of his increased anginal symptoms and this showed that his stents were patent in the left circumflex system and the other scattered noncritical coronary artery disease was insignificant. His right heart pressures were within normal limits with a cardiac index of 2.85.  After review of the studies and examination of the patient, I felt that it would be best to proceed ahead with aortic valve replacement.  Also felt that a left-sided maze procedure was indicated to try to prevent atrial fibrillation and to give him the best chance of getting off amiodarone since he was very symptomatic with his atrial fibrillation.  He had a history of chronotropic incompetence with sinus node dysfunction and had a dual-chamber pacemaker implanted in the past with a recent generator change.  I discussed the operative procedure of aortic valve replacement and left-sided maze procedure with he and his wife.  We discussed the pros and cons of mechanical and tissue valves and my recommendation for a tissue valve given his age of 77 years old the coronary artery disease.  He also like to stop his Coumadin particularly when he went on ski trips and would not be able do that with a mechanical valve.  He was therefore in agreement to use a tissue valve.  I discussed the operative procedure with him including alternatives, benefits, and risks including, but not limited to bleeding, blood transfusion, infection, stroke, myocardial infarction, heart block or loss of sinus node function, recurrent atrial fibrillation, organ dysfunction, and death.  He understood all this and agreed to proceed.  OPERATIVE PROCEDURE:  The patient was taken to the operative room and placed on table in supine position.  After induction of general endotracheal anesthesia, a Foley catheter was placed in bladder  using sterile technique.  Then, the chest, abdomen, and both lower extremities were prepped and draped in usual sterile manner.  Chest was entered through a median sternotomy incision.  The pericardium opened midline. Examination of the heart showed good ventricular contractility.  The ascending aorta was of normal size and had no palpable plaques in it.  Transesophageal echocardiogram was performed by Dr. Arta Bruce.  This showed moderate to severe aortic stenosis with a calcified thickened aortic valve.  There is 2+ aortic insufficiency.  There was no significant mitral regurgitation.  Left ventricular function appeared well preserved.  Then the patient was heparinized when adequate ACT was obtained.  The distal ascending aorta was cannulated using a 22-French aortic cannula for arterial inflow.  Venous outflow was achieved using bicaval venous cannulation with an 8-French straight cannula placed into the superior vena cava through a pursestring suture.  A 40-French malleable plastic right-angle cannula was placed through pursestring suture in the low right atrium.  An antegrade cardioplegia and vent cannula was inserted in aortic root.  A retrograde cardioplegic cannula was placed through a pursestring suture in the right atrium and advanced in the coronary sinus.  Then the aorta was crossclamped and 800 mL of cold blood antegrade cardioplegia was administered into the aortic root with quick arrest of the heart.  Systemic hypothermia to 32 degrees centigrade and topical hypothermic iced saline was used.  A temperature probe was placed in the septum and insulating pad was placed in the pericardium.  Additional doses of cold blood retrograde cardioplegia were given about 20 minutes intervals to maintain myocardial temperature around 10 degrees centigrade or less.  Then the left-sided maze procedure was started.  The heart was retracted towards the right to expose the left  pulmonary veins.  Left upper and lower pulmonary vein were encircled with a tape.  Then a bipolar radiofrequency clamp was passed around the pulmonary veins and positioned on the left atrial wall.  An encircling the bipolar radiofrequency lesion was performed around the pulmonary veins.  Then a 2nd bipolar radiofrequency lesion was placed around the base of the left atrial appendage.  Then the AtriCure cryotherapy probe was used to create a cryolesion joining the base of left atrial appendage with the pulmonary vein encircling lesions.  All cryotherapy lesions were performed for 2 minutes.  Then the heart was returned to its normal anatomic position.  The superior vena cava was mobilized.  Then a vertical incision was made in the left atrium in the interatrial groove. Dissection was performed posterior to the right pulmonary veins and a bipolar radiofrequency clamp was then inserted from above and below to complete an encircling lesion around the right pulmonary veins.  Then the cryoprobe was used to place a cryotherapy lesion between the 2 pulmonary vein encircling lesions.  Then a cryotherapy lesion was performed from this crossing lesion down to the mitral anulus at the level of P3,.  Then the left atriotomy was closed in 2 layers using continuous 3-0 Prolene suture.  A left ventricular vent was placed through the suture line into the left ventricle for the aortic valve procedure and the suture was pulled snugly but not tied.  I did not perform a right-sided maze procedure since the patient had a permanent transvenous pacemaker already in place.  Then attention was turned to aortic valve replacement.  The aorta was opened transversely just above the sino-tubular junction.  Examination of the native valve showed that there were 3 leaflets that were markedly thickened, calcified, and fused at the commissures.  Did not coapt very well.  The right and left coronary ostia were identified  and were not obstructed.  The native valve was then excised.  The anulus was decalcified with rongeurs.  Care was taken to remove all particulate debris.  Left ventricle and aortic root were copiously irrigated with iced saline solution.  The anulus was sized and a 25 mm Edwards pericardial Magna-Ease valve was chosen.  This had model #3300TFX and serial [id number].  Then, a series of pledgeted 2-0 Ethibond horizontal mattress sutures were placed around the aortic anulus with pledgets in a subannular position.  The sutures were placed through the sewing ring and the valve lowered into place.  The sutures were tied sequentially. The valve seated nicely.  Left and right coronary ostia were not obstructed.  Then, the patient rewarmed to 37 degrees centigrade.  The aortotomy was closed in 2 layers using continuous 4-0 Prolene suture with failed strips to reinforce the closure.  Then the heart was retracted cephalad to expose the coronary sinus. Another cryotherapy lesion was performed across the coronary sinus into the left atrial wall at the location between the distal left circumflex and the distal right coronary artery.  This completed the maze procedure.  Then the left atriotomy was closed by tying the previously placed 2 suture lines.  Then the left atrial appendage was ligated.  The width of the base of the appendage was measured was about 40 mm.  Therefore, a 40 mm Gillinov- Cosgrove left atrial occlusion device was used and applied to the base of left atrial appendage without difficulty resulting in complete occlusion.  Then the left side of the heart was de-aired.  We did use CO2 insufflation throughout the case to minimize intracardiac air.  The head was placed in Trendelenburg position and the crossclamp removed with time of 126 minutes.  The patient spontaneously developed ventricular fibrillation and was defibrillated into a junctional rhythm. Then 2 temporary right ventricular  and right atrial pacing wires were placed, brought through the skin.  When the patient rewarmed to 37 degrees centigrade, he was weaned from cardiopulmonary bypass on no inotropic agents.  Total bypass time was 146 minutes.  Cardiac function appeared excellent.  Cardiac output of 6 L/minute.  TEE was performed that showed a normal functioning aortic valve prosthesis without evidence of regurgitation or perivalvular leak. There was no mitral regurgitation.  There was no tricuspid regurgitation.  Left ventricular function appeared well preserved.  Then protamine was given in the venous and aortic cannulas without difficulty.  Two chest tubes were placed with 2 in the post pericardium, 1 in anterior mediastinum.  The sternum was then closed with double #6 stainless steel wires.  Fascia was closed with continuous #1 Vicryl suture.  Subcutaneous tissue was closed with continuous 2-0 Vicryl and the skin with a 3-0 Vicryl subcuticular closure.  The sponge, needle, and instrument counts were correct according to the scrub nurse.  Dry sterile dressing applied over  the incisions around the chest tubes which were Pleur-Evac suction.  The patient remained hemodynamically stable, transferred to the SICU in guarded, but stable condition.     Evelene Croon, M.D.     BB/MEDQ  D:  03/15/2011  T:  03/16/2011  Job:  [id number]

## 2011-03-16 NOTE — Progress Notes (Signed)
UR Completed.  [name] [phone] 03/16/2011  

## 2011-03-16 NOTE — Progress Notes (Signed)
1 Day Post-Op Procedure(s) (LRB): AORTIC VALVE REPLACEMENT (AVR) (N/A) MAZE (N/A) Subjective: Sore, but otherwise feels ok  Objective: Vital signs in last 24 hours: Temp:  [95.2 F (35.1 C)-99.9 F (37.7 C)] 99.9 F (37.7 C) (01/15 0808) Pulse Rate:  [59-91] 79  (01/15 0700) Cardiac Rhythm:  [-] Atrial paced (01/15 0200) Resp:  [13-17] 15  (01/15 0700) BP: (85-116)/(57-81) 91/57 mmHg (01/14 2200) SpO2:  [89 %-100 %] 95 % (01/15 0700) Arterial Line BP: (83-146)/(37-74) 113/41 mmHg (01/15 0700) FiO2 (%):  [39.8 %-50.2 %] 39.8 % (01/14 1645) Weight:  [102.059 kg (225 lb)-109.4 kg (241 lb 2.9 oz)] 109.4 kg (241 lb 2.9 oz) (01/15 0500)  Hemodynamic parameters for last 24 hours: PAP: (17-38)/(9-21) 28/10 mmHg CO:  [3.4 L/min-4.6 L/min] 4.4 L/min CI:  [1.5 L/min/m2-2 L/min/m2] 2 L/min/m2  Intake/Output from previous day: 01/14 0701 - 01/15 0700 In: 7405.4 [P.O.:600; I.V.:4725.4; Blood:850; NG/GT:30; IV Piggyback:1200] Out: 4540 [JWJXB:1478; Blood:1850; Chest Tube:730] Intake/Output this shift:    General appearance: alert and cooperative Neurologic: intact Heart: regular rate and rhythm Lungs: clear to auscultation bilaterally Extremities: edema mild Wound: dressing dry  Lab Results:  Basename 03/16/11 0410 03/15/11 2001 03/15/11 2000  WBC 9.2 -- 12.2*  HGB 8.2* 7.5* --  HCT 24.4* 22.0* --  PLT 164 -- 159   BMET:  Basename 03/16/11 0410 03/15/11 2001  NA 138 139  K 3.6 4.0  CL 105 106  CO2 24 --  GLUCOSE 120* 150*  BUN 11 9  CREATININE 0.97 0.90  CALCIUM 7.2* --    PT/INR:  Basename 03/15/11 1330  LABPROT 21.7*  INR 1.85*   ABG    Component Value Date/Time   PHART 7.316* 03/15/2011 1805   HCO3 21.2 03/15/2011 1805   TCO2 24 03/15/2011 2001   ACIDBASEDEF 5.0* 03/15/2011 1805   O2SAT 95.0 03/15/2011 1805   CBG (last 3)   Basename 03/16/11 0418 03/15/11 2329 03/15/11 2147  GLUCAP 123* 119* 157*    Assessment/Plan: S/P Procedure(s) (LRB): AORTIC VALVE  REPLACEMENT (AVR) (N/A) MAZE (N/A) Mobilize Diuresis Diabetes control:  Hgb A1C was 5.7 preop d/c tubes/lines See progression orders Acute blood loss anemia:  Start Fe2+ and observe. Expect this to rise with diuresis. Rhythm: A-pacing at 60. Will let cardiology decide about internal pacer settings. Will resume amio 200mg  per day and would wait 6 wks before trying to stop that. Resume coumadin tomorrow.  LOS: 1 day    BARTLE,BRYAN K 03/16/2011

## 2011-03-16 NOTE — Anesthesia Postprocedure Evaluation (Signed)
  Anesthesia Post-op Note  Patient: [name]  Procedure(s) Performed:  AORTIC VALVE REPLACEMENT (AVR); MAZE  Patient Location: PACU and ICU  Anesthesia Type: General  Level of Consciousness: sedated  Airway and Oxygen Therapy: Patient remains intubated per anesthesia plan  Post-op Pain: none  Post-op Assessment: Post-op Vital signs reviewed  Post-op Vital Signs: stable  Complications: No apparent anesthesia complications

## 2011-03-17 ENCOUNTER — Inpatient Hospital Stay (HOSPITAL_COMMUNITY): Payer: Medicare Other

## 2011-03-17 LAB — GLUCOSE, CAPILLARY
Glucose-Capillary: 131 mg/dL — ABNORMAL HIGH (ref 70–99)
Glucose-Capillary: 125 mg/dL — ABNORMAL HIGH (ref 70–99)
Glucose-Capillary: 163 mg/dL — ABNORMAL HIGH (ref 70–99)

## 2011-03-17 LAB — CBC
Hemoglobin: 7.1 g/dL — ABNORMAL LOW (ref 13.0–17.0)
MCH: 30 pg (ref 26.0–34.0)
MCHC: 34 g/dL (ref 30.0–36.0)
Platelets: 99 10*3/uL — ABNORMAL LOW (ref 150–400)
RDW: 14.3 % (ref 11.5–15.5)

## 2011-03-17 LAB — BASIC METABOLIC PANEL
BUN: 15 mg/dL (ref 6–23)
Calcium: 7.8 mg/dL — ABNORMAL LOW (ref 8.4–10.5)
Creatinine, Ser: 1.11 mg/dL (ref 0.50–1.35)
GFR calc Af Amer: 74 mL/min — ABNORMAL LOW (ref >90)
GFR calc non Af Amer: 63 mL/min — ABNORMAL LOW (ref >90)
Glucose, Bld: 130 mg/dL — ABNORMAL HIGH (ref 70–99)

## 2011-03-17 LAB — PREPARE RBC (CROSSMATCH)

## 2011-03-17 MED ORDER — WARFARIN SODIUM 2.5 MG PO TABS
2.5000 mg | ORAL_TABLET | Freq: Once | ORAL | Status: AC
  Administered 2011-03-17: 2.5 mg via ORAL
  Filled 2011-03-17: qty 1

## 2011-03-17 MED ORDER — POTASSIUM CHLORIDE CRYS ER 20 MEQ PO TBCR
40.0000 meq | EXTENDED_RELEASE_TABLET | Freq: Two times a day (BID) | ORAL | Status: DC
  Administered 2011-03-17 – 2011-03-18 (×3): 40 meq via ORAL
  Filled 2011-03-17 (×4): qty 2

## 2011-03-17 MED ORDER — FUROSEMIDE 10 MG/ML IJ SOLN
40.0000 mg | Freq: Once | INTRAMUSCULAR | Status: DC
  Filled 2011-03-17: qty 4

## 2011-03-17 MED ORDER — POTASSIUM CHLORIDE 10 MEQ/50ML IV SOLN
INTRAVENOUS | Status: AC
  Administered 2011-03-17: 10 meq via INTRAVENOUS
  Filled 2011-03-17: qty 50

## 2011-03-17 MED ORDER — FUROSEMIDE 10 MG/ML IJ SOLN
40.0000 mg | Freq: Four times a day (QID) | INTRAMUSCULAR | Status: AC
  Administered 2011-03-17 (×3): 40 mg via INTRAVENOUS
  Filled 2011-03-17 (×3): qty 4

## 2011-03-17 MED ORDER — POTASSIUM CHLORIDE 10 MEQ/50ML IV SOLN
10.0000 meq | INTRAVENOUS | Status: AC | PRN
  Administered 2011-03-17 (×3): 10 meq via INTRAVENOUS
  Filled 2011-03-17 (×2): qty 50

## 2011-03-17 MED FILL — Heparin Sodium (Porcine) Inj 1000 Unit/ML: INTRAMUSCULAR | Qty: 30 | Status: AC

## 2011-03-17 MED FILL — Heparin Sodium (Porcine) Inj 1000 Unit/ML: INTRAMUSCULAR | Qty: 10 | Status: AC

## 2011-03-17 MED FILL — Electrolyte-R (PH 7.4) Solution: INTRAVENOUS | Qty: 5000 | Status: AC

## 2011-03-17 MED FILL — Sodium Chloride Irrigation Soln 0.9%: Qty: 3000 | Status: AC

## 2011-03-17 NOTE — Progress Notes (Signed)
Pacer working 80/min Packed cells infused O2 SAT 96  Restng comfortably

## 2011-03-17 NOTE — Progress Notes (Signed)
Labs/tele reviewed. I did not disturb patient this am as he was sleeping. Tele shows atrial pacing 80 bpm without any significant arrhythmia noted. HgB stable at 7.1. Will cont to follow.

## 2011-03-17 NOTE — Plan of Care (Signed)
Problem: Phase II Progression Outcomes Goal: Patient extubated within - Outcome: Completed/Met Date Met:  03/17/11 Within 6 hours

## 2011-03-17 NOTE — Progress Notes (Signed)
2 Days Post-Op Procedure(s) (LRB): AORTIC VALVE REPLACEMENT (AVR) (N/A) MAZE (N/A) Subjective: Feels weak and mildly SOB overnight and this am  Objective: Vital signs in last 24 hours: Temp:  [97.7 F (36.5 C)-99.1 F (37.3 C)] 98.8 F (37.1 C) (01/16 0830) Pulse Rate:  [79-84] 79  (01/16 0800) Cardiac Rhythm:  [-] Atrial paced (01/16 0800) Resp:  [13-22] 18  (01/16 0800) BP: (82-139)/(43-68) 99/55 mmHg (01/16 0830) SpO2:  [90 %-100 %] 95 % (01/16 0800) Weight:  [113 kg (249 lb 1.9 oz)] 113 kg (249 lb 1.9 oz) (01/16 0500)   Rhythm under external pacer is junction or A-paced 50.    Hemodynamic parameters for last 24 hours:    Intake/Output from previous day: 01/15 0701 - 01/16 0700 In: 1364.7 [P.O.:660; I.V.:454.7; IV Piggyback:250] Out: 1035 [Urine:945; Chest Tube:90] Intake/Output this shift: Total I/O In: 335 [P.O.:250; I.V.:20; Blood:15; IV Piggyback:50] Out: 30 [Urine:30]  General appearance: alert and cooperative Heart: regular rate and rhythm, S1, S2 normal, no murmur, click, rub or gallop Lungs: clear to auscultation bilaterally Extremities: edema mild Wound: incision ok  Lab Results:  Basename 03/17/11 0435 03/16/11 1741 03/16/11 1700  WBC 7.6 -- 8.9  HGB 7.1* 7.1* --  HCT 20.9* 21.0* --  PLT 99* -- 116*   BMET:  Basename 03/17/11 0435 03/16/11 1741 03/16/11 0410  NA 133* 137 --  K 3.4* 4.7 --  CL 101 100 --  CO2 27 -- 24  GLUCOSE 130* 134* --  BUN 15 13 --  CREATININE 1.11 1.20 --  CALCIUM 7.8* -- 7.2*    PT/INR:  Basename 03/15/11 1330  LABPROT 21.7*  INR 1.85*   ABG    Component Value Date/Time   PHART 7.316* 03/15/2011 1805   HCO3 21.2 03/15/2011 1805   TCO2 25 03/16/2011 1741   ACIDBASEDEF 5.0* 03/15/2011 1805   O2SAT 95.0 03/15/2011 1805   CBG (last 3)   Basename 03/17/11 0750 03/17/11 0353 03/17/11 0009  GLUCAP 118* 132* 175*    Assessment/Plan: S/P Procedure(s) (LRB): AORTIC VALVE REPLACEMENT (AVR) (N/A) MAZE  (N/A) Symptomatic acute blood loss anemia from surgery.  Will transfuse 2 units PRBC's. Volume excess:  Diurese today. Mobilize. Start coumadin slowly tonight. Unclear if internal pacer is working appropriately.  I thought it was set to AAI 60.  Will ask rep to check.   LOS: 2 days    Evelene Croon K 03/17/2011

## 2011-03-18 DIAGNOSIS — I359 Nonrheumatic aortic valve disorder, unspecified: Secondary | ICD-10-CM

## 2011-03-18 LAB — GLUCOSE, CAPILLARY
Glucose-Capillary: 128 mg/dL — ABNORMAL HIGH (ref 70–99)
Glucose-Capillary: 124 mg/dL — ABNORMAL HIGH (ref 70–99)
Glucose-Capillary: 174 mg/dL — ABNORMAL HIGH (ref 70–99)

## 2011-03-18 LAB — BASIC METABOLIC PANEL
GFR calc non Af Amer: 69 mL/min — ABNORMAL LOW (ref >90)
Glucose, Bld: 148 mg/dL — ABNORMAL HIGH (ref 70–99)
Potassium: 3.8 mEq/L (ref 3.5–5.1)
Sodium: 133 mEq/L — ABNORMAL LOW (ref 135–145)

## 2011-03-18 LAB — PROTIME-INR
INR: 1.39 (ref 0.00–1.49)
Prothrombin Time: 17.3 seconds — ABNORMAL HIGH (ref 11.6–15.2)

## 2011-03-18 LAB — CBC
Hemoglobin: 8.9 g/dL — ABNORMAL LOW (ref 13.0–17.0)
MCHC: 34.1 g/dL (ref 30.0–36.0)
RBC: 3 MIL/uL — ABNORMAL LOW (ref 4.22–5.81)

## 2011-03-18 MED ORDER — SODIUM CHLORIDE 0.9 % IV SOLN: 250.0000 mL | INTRAVENOUS | Status: DC | PRN

## 2011-03-18 MED ORDER — MOVING RIGHT ALONG BOOK
Freq: Once | Status: AC
  Administered 2011-03-19: 11:00:00
  Filled 2011-03-18: qty 1

## 2011-03-18 MED ORDER — METOPROLOL SUCCINATE ER 25 MG PO TB24
25.0000 mg | ORAL_TABLET | Freq: Every day | ORAL | Status: DC
  Administered 2011-03-18 – 2011-03-20 (×3): 25 mg via ORAL
  Filled 2011-03-18 (×4): qty 1

## 2011-03-18 MED ORDER — ONDANSETRON HCL 4 MG PO TABS: 4.0000 mg | ORAL_TABLET | Freq: Four times a day (QID) | ORAL | Status: DC | PRN

## 2011-03-18 MED ORDER — SODIUM CHLORIDE 0.9 % IJ SOLN: 3.0000 mL | INTRAMUSCULAR | Status: DC | PRN

## 2011-03-18 MED ORDER — BISACODYL 5 MG PO TBEC: 10.0000 mg | DELAYED_RELEASE_TABLET | Freq: Every day | ORAL | Status: DC | PRN

## 2011-03-18 MED ORDER — FUROSEMIDE 10 MG/ML IJ SOLN
40.0000 mg | Freq: Once | INTRAMUSCULAR | Status: AC
  Administered 2011-03-18: 40 mg via INTRAVENOUS
  Filled 2011-03-18: qty 4

## 2011-03-18 MED ORDER — INSULIN ASPART 100 UNIT/ML ~~LOC~~ SOLN
0.0000 [IU] | Freq: Three times a day (TID) | SUBCUTANEOUS | Status: DC
  Administered 2011-03-18: 4 [IU] via SUBCUTANEOUS
  Administered 2011-03-19 – 2011-03-20 (×6): 2 [IU] via SUBCUTANEOUS

## 2011-03-18 MED ORDER — SODIUM CHLORIDE 0.9 % IJ SOLN
3.0000 mL | Freq: Two times a day (BID) | INTRAMUSCULAR | Status: DC
  Administered 2011-03-18 – 2011-03-20 (×5): 3 mL via INTRAVENOUS

## 2011-03-18 MED ORDER — BISACODYL 10 MG RE SUPP: 10.0000 mg | Freq: Every day | RECTAL | Status: DC | PRN

## 2011-03-18 MED ORDER — POTASSIUM CHLORIDE CRYS ER 20 MEQ PO TBCR
40.0000 meq | EXTENDED_RELEASE_TABLET | Freq: Two times a day (BID) | ORAL | Status: DC
  Filled 2011-03-18 (×2): qty 2

## 2011-03-18 MED ORDER — FUROSEMIDE 80 MG PO TABS
80.0000 mg | ORAL_TABLET | Freq: Every day | ORAL | Status: DC
  Administered 2011-03-19 – 2011-03-20 (×2): 80 mg via ORAL
  Filled 2011-03-18 (×4): qty 1

## 2011-03-18 MED ORDER — WARFARIN SODIUM 2.5 MG PO TABS
2.5000 mg | ORAL_TABLET | Freq: Once | ORAL | Status: AC
  Administered 2011-03-18: 2.5 mg via ORAL
  Filled 2011-03-18: qty 1

## 2011-03-18 MED ORDER — ASPIRIN EC 81 MG PO TBEC
81.0000 mg | DELAYED_RELEASE_TABLET | Freq: Every day | ORAL | Status: DC
Start: 2011-03-18 — End: 2011-03-21
  Administered 2011-03-18 – 2011-03-21 (×4): 81 mg via ORAL
  Filled 2011-03-18 (×4): qty 1

## 2011-03-18 MED ORDER — DOCUSATE SODIUM 100 MG PO CAPS
200.0000 mg | ORAL_CAPSULE | Freq: Every day | ORAL | Status: DC
  Administered 2011-03-19 – 2011-03-21 (×3): 200 mg via ORAL
  Filled 2011-03-18 (×3): qty 2

## 2011-03-18 MED ORDER — POTASSIUM CHLORIDE CRYS ER 20 MEQ PO TBCR
40.0000 meq | EXTENDED_RELEASE_TABLET | Freq: Once | ORAL | Status: AC
  Administered 2011-03-18: 40 meq via ORAL

## 2011-03-18 MED ORDER — ONDANSETRON HCL 4 MG/2ML IJ SOLN: 4.0000 mg | Freq: Four times a day (QID) | INTRAMUSCULAR | Status: DC | PRN

## 2011-03-18 NOTE — Plan of Care (Signed)
Problem: Phase III Progression Outcomes Goal: Time patient transferred to PCTU/Telemetry POD Outcome: Completed/Met Date Met:  03/18/11 1640

## 2011-03-18 NOTE — Progress Notes (Signed)
3 Days Post-Op Procedure(s) (LRB): AORTIC VALVE REPLACEMENT (AVR) (N/A) MAZE (N/A) Subjective: Feeling better.  No BM yet   Objective: Vital signs in last 24 hours: Temp:  [97.3 F (36.3 C)-98.9 F (37.2 C)] 97.3 F (36.3 C) (01/17 0805) Pulse Rate:  [51-81] 80  (01/17 0700) Cardiac Rhythm:  [-] Atrial paced (01/16 2327) Resp:  [11-21] 17  (01/17 0700) BP: (83-125)/(47-76) 110/61 mmHg (01/17 0700) SpO2:  [91 %-99 %] 94 % (01/17 0700) FiO2 (%):  [1 %] 1 % (01/17 0500) Weight:  [112.6 kg (248 lb 3.8 oz)] 112.6 kg (248 lb 3.8 oz) (01/17 0500)   Rhythm: Sinus 70  Hemodynamic parameters for last 24 hours:    Intake/Output from previous day: 01/16 0701 - 01/17 0700 In: 2292 [P.O.:1330; I.V.:120; Blood:680; IV Piggyback:162] Out: 3935 [Urine:3935] Intake/Output this shift:    General appearance: alert and cooperative Heart: regular rate and rhythm, S1, S2 normal, no murmur, click, rub or gallop Lungs: diminished breath sounds bibasilar Extremities: edema mild Wound: incision ok  Lab Results:  Basename 03/18/11 0355 03/17/11 0435  WBC 9.5 7.6  HGB 8.9* 7.1*  HCT 26.1* 20.9*  PLT 130* 99*   BMET:  Basename 03/18/11 0355 03/17/11 0435  NA 133* 133*  K 3.8 3.4*  CL 97 101  CO2 29 27  GLUCOSE 148* 130*  BUN 13 15  CREATININE 1.03 1.11  CALCIUM 8.0* 7.8*    PT/INR:  Basename 03/18/11 0355  LABPROT 17.3*  INR 1.39   ABG    Component Value Date/Time   PHART 7.316* 03/15/2011 1805   HCO3 21.2 03/15/2011 1805   TCO2 25 03/16/2011 1741   ACIDBASEDEF 5.0* 03/15/2011 1805   O2SAT 95.0 03/15/2011 1805   CBG (last 3)   Basename 03/18/11 0409 03/17/11 2324 03/17/11 1942  GLUCAP 147* 144* 163*    Assessment/Plan: S/P Procedure(s) (LRB): AORTIC VALVE REPLACEMENT (AVR) (N/A) MAZE (N/A) Mobilize Diuresis Diabetes control Continue coumadin Plan for transfer to step-down: see transfer orders   LOS: 3 days    BARTLE,BRYAN K 03/18/2011

## 2011-03-18 NOTE — Progress Notes (Signed)
Subjective:  Feeling better. Discussed pacing issues with Dr Laneta Simmers. A-pacing threshold was increased and adjustments made by pacemaker rep to pace at a higher output.   Objective:  Vital Signs in the last 24 hours: Temp:  [97.8 F (36.6 C)-98.9 F (37.2 C)] 98.1 F (36.7 C) (01/17 0423) Pulse Rate:  [51-81] 80  (01/17 0700) Resp:  [11-21] 17  (01/17 0700) BP: (83-125)/(47-76) 110/61 mmHg (01/17 0700) SpO2:  [91 %-99 %] 94 % (01/17 0700) FiO2 (%):  [1 %] 1 % (01/17 0500) Weight:  [112.6 kg (248 lb 3.8 oz)] 112.6 kg (248 lb 3.8 oz) (01/17 0500)  Intake/Output from previous day: 01/16 0701 - 01/17 0700 In: 2292 [P.O.:1330; I.V.:120; Blood:680; IV Piggyback:162] Out: 3935 [Urine:3935]  Physical Exam: Pt is alert and oriented, NAD Remaining exam not done today.  Lab Results:  Basename 03/18/11 0355 03/17/11 0435  WBC 9.5 7.6  HGB 8.9* 7.1*  PLT 130* 99*    Basename 03/18/11 0355 03/17/11 0435  NA 133* 133*  K 3.8 3.4*  CL 97 101  CO2 29 27  GLUCOSE 148* 130*  BUN 13 15  CREATININE 1.03 1.11   No results found for this basename: TROPONINI:2,CK,MB:2 in the last 72 hours  Tele: a-paced  Assessment/Plan:  Progressing well after AVR. HgB up to 8.9 after 2 units prbc's. Good urine output on IV lasix.  Tonny Bollman, M.D. 03/18/2011, 7:53 AM

## 2011-03-19 ENCOUNTER — Inpatient Hospital Stay (HOSPITAL_COMMUNITY): Payer: Medicare Other

## 2011-03-19 ENCOUNTER — Ambulatory Visit: Payer: Self-pay | Admitting: Pharmacist

## 2011-03-19 DIAGNOSIS — I4891 Unspecified atrial fibrillation: Secondary | ICD-10-CM

## 2011-03-19 DIAGNOSIS — I359 Nonrheumatic aortic valve disorder, unspecified: Secondary | ICD-10-CM

## 2011-03-19 LAB — PROTIME-INR
INR: 1.32 (ref 0.00–1.49)
Prothrombin Time: 16.6 seconds — ABNORMAL HIGH (ref 11.6–15.2)

## 2011-03-19 LAB — BASIC METABOLIC PANEL
CO2: 29 mEq/L (ref 19–32)
Calcium: 8.4 mg/dL (ref 8.4–10.5)
GFR calc non Af Amer: 80 mL/min — ABNORMAL LOW (ref >90)
Potassium: 4.2 mEq/L (ref 3.5–5.1)
Sodium: 130 mEq/L — ABNORMAL LOW (ref 135–145)

## 2011-03-19 LAB — CBC
MCH: 29.7 pg (ref 26.0–34.0)
Platelets: 149 10*3/uL — ABNORMAL LOW (ref 150–400)
RBC: 2.9 MIL/uL — ABNORMAL LOW (ref 4.22–5.81)

## 2011-03-19 MED ORDER — POTASSIUM CHLORIDE CRYS ER 20 MEQ PO TBCR
40.0000 meq | EXTENDED_RELEASE_TABLET | Freq: Every day | ORAL | Status: AC
  Administered 2011-03-19 – 2011-03-21 (×3): 40 meq via ORAL
  Filled 2011-03-19 (×2): qty 2

## 2011-03-19 MED ORDER — LORAZEPAM 1 MG PO TABS
1.0000 mg | ORAL_TABLET | Freq: Once | ORAL | Status: DC
  Filled 2011-03-19: qty 1

## 2011-03-19 MED ORDER — LACTULOSE 10 GM/15ML PO SOLN
30.0000 g | Freq: Once | ORAL | Status: DC
  Filled 2011-03-19: qty 45

## 2011-03-19 MED ORDER — LACTULOSE 10 GM/15ML PO SOLN
30.0000 g | Freq: Once | ORAL | Status: AC
  Administered 2011-03-19: 30 g via ORAL
  Filled 2011-03-19: qty 45

## 2011-03-19 MED ORDER — FERROUS GLUCONATE 324 (38 FE) MG PO TABS
324.0000 mg | ORAL_TABLET | Freq: Two times a day (BID) | ORAL | Status: DC
  Administered 2011-03-19 – 2011-03-21 (×4): 324 mg via ORAL
  Filled 2011-03-19 (×7): qty 1

## 2011-03-19 MED ORDER — BISACODYL 10 MG RE SUPP
10.0000 mg | Freq: Once | RECTAL | Status: AC
  Administered 2011-03-19: 10 mg via RECTAL
  Filled 2011-03-19: qty 1

## 2011-03-19 MED ORDER — WARFARIN SODIUM 5 MG PO TABS
5.0000 mg | ORAL_TABLET | Freq: Once | ORAL | Status: AC
  Administered 2011-03-19: 5 mg via ORAL
  Filled 2011-03-19 (×2): qty 1

## 2011-03-19 NOTE — Progress Notes (Signed)
4 Days Post-Op Procedure(s) (LRB): AORTIC VALVE REPLACEMENT (AVR) (N/A) MAZE (N/A) Subjective: No BM yet.  Feels bloated. Some dyspnea.  Some chest wall pain  Objective: Vital signs in last 24 hours: Temp:  [97.7 F (36.5 C)-98.9 F (37.2 C)] 98.9 F (37.2 C) (01/18 0501) Pulse Rate:  [64-73] 68  (01/18 0501) Cardiac Rhythm:  [-] Junctional rhythm (01/18 0740) Resp:  [12-19] 18  (01/18 0501) BP: (104-131)/(44-77) 127/61 mmHg (01/18 0501) SpO2:  [91 %-99 %] 92 % (01/18 0501) Weight:  [113.2 kg (249 lb 9 oz)] 113.2 kg (249 lb 9 oz) (01/18 1610)  Hemodynamic parameters for last 24 hours:    Intake/Output from previous day: 01/17 0701 - 01/18 0700 In: 1200 [P.O.:1200] Out: 2130 [Urine:2130] Intake/Output this shift:    General appearance: alert and cooperative Heart: regular rate and rhythm, S1, S2 normal, no murmur, click, rub or gallop Lungs: rales base - left Abdomen: soft, non-tender; bowel sounds normal; no masses,  no organomegaly Extremities: edema mild Wound: incision looks good  Lab Results:  Basename 03/19/11 0430 03/18/11 0355  WBC 7.9 9.5  HGB 8.6* 8.9*  HCT 25.4* 26.1*  PLT 149* 130*   BMET:  Basename 03/19/11 0430 03/18/11 0355  NA 130* 133*  K 4.2 3.8  CL 94* 97  CO2 29 29  GLUCOSE 134* 148*  BUN 14 13  CREATININE 0.95 1.03  CALCIUM 8.4 8.0*    PT/INR:  Basename 03/19/11 0430  LABPROT 16.6*  INR 1.32   ABG    Component Value Date/Time   PHART 7.316* 03/15/2011 1805   HCO3 21.2 03/15/2011 1805   TCO2 25 03/16/2011 1741   ACIDBASEDEF 5.0* 03/15/2011 1805   O2SAT 95.0 03/15/2011 1805   CBG (last 3)   Basename 03/19/11 0622 03/18/11 2121 03/18/11 1601  GLUCAP 135* 171* 174*    Assessment/Plan: S/P Procedure(s) (LRB): AORTIC VALVE REPLACEMENT (AVR) (N/A) MAZE (N/A) Mobilize Diuresis Diabetes control Lactulose to get bowels moving DC pacing wires Continue coumadin Will check CXR today   LOS: 4 days    BARTLE,BRYAN  K 03/19/2011

## 2011-03-19 NOTE — Progress Notes (Signed)
CARDIAC REHAB PHASE I   PRE:  Rate/Rhythm: 64 pacing    BP: sitting 112/72    SaO2: 92-93  RA  MODE:  Ambulation: 350 ft   POST:  Rate/Rhythm: 79 SR    BP: sitting 170/85     SaO2: 97  RA  Pt lightheaded upon standing and going to bathroom. Sat again and took BP, which was 124/70. Stood and BP 142/75. Used RW and assist x1. Continued dizzy/lightheaded. Pt pale. Had to sit at half-way. Sts he feels weak. SaO2 92-97 while walking. BP 170/85 after walk. Felt and looked better after sitting in recliner for few minutes. Requests referral be sent to [facility]. Will f/u.  [id number]  [name] CES, ACSM

## 2011-03-19 NOTE — Progress Notes (Signed)
UR Completed.  [name] Jane [phone] 03/19/2011  

## 2011-03-19 NOTE — Progress Notes (Signed)
1000 - pt ambulated 167ft with RW x 1 assist, steady gait.  Returned to chair, personal RN is at his bedside, will continue to monitor.  Ninetta Lights 03/19/2011 12:30 PM

## 2011-03-19 NOTE — Progress Notes (Signed)
Pt not feeling well today. Initially agreed to d/c EPW after lunch, but CR walked with pt and taught him after lunch, then pt had several visitors this afternoon. Discussed with pt around 1530 to pull EPW but he wasn't feeling well, winded, tired, and in pain - so agreed to give pain med and wait an hour.  Upon f/u pt still isn't feeling well and discussed taking wires out in the morning.  Will continue to monitor.  Ninetta Lights 03/19/2011 4:26 PM

## 2011-03-19 NOTE — Progress Notes (Signed)
Subjective:  Feels short of breath. No other complaints.  Objective:  Vital Signs in the last 24 hours: Temp:  [97.7 F (36.5 C)-98.9 F (37.2 C)] 98.9 F (37.2 C) (01/18 0501) Pulse Rate:  [64-73] 68  (01/18 0501) Resp:  [12-19] 18  (01/18 0501) BP: (105-131)/(44-77) 127/61 mmHg (01/18 0501) SpO2:  [91 %-99 %] 92 % (01/18 0501) Weight:  [113.2 kg (249 lb 9 oz)] 113.2 kg (249 lb 9 oz) (01/18 0623)  Intake/Output from previous day: 01/17 0701 - 01/18 0700 In: 1200 [P.O.:1200] Out: 2130 [Urine:2130]  Physical Exam: Pt is alert and oriented, NAD HEENT: normal Neck: JVP - normal, carotids 2+= without bruits Lungs: CTA bilaterally CV: RRR without murmur or gallop Abd: soft, NT, Positive BS, no hepatomegaly Ext: no C/C/E, distal pulses intact and equal Skin: warm/dry no rash   Lab Results:  Basename 03/19/11 0430 03/18/11 0355  WBC 7.9 9.5  HGB 8.6* 8.9*  PLT 149* 130*    Basename 03/19/11 0430 03/18/11 0355  NA 130* 133*  K 4.2 3.8  CL 94* 97  CO2 29 29  GLUCOSE 134* 148*  BUN 14 13  CREATININE 0.95 1.03   No results found for this basename: TROPONINI:2,CK,MB:2 in the last 72 hours  Tele: pacer spikes hard to see, but I think a-paced at 65 bpm  Assessment/Plan:  1. Severe AS s/p bioprosthetic AVR 2. Afib s/p Maze. Rhythm stable - a paced. 3. Shortness of breath  CXR reviewed and appears stable, only small Left effusion. Agree with continued diuresis, mobilization. Will follow.   Tonny Bollman, M.D. 03/19/2011, 11:53 AM

## 2011-03-20 LAB — BASIC METABOLIC PANEL
Calcium: 8.7 mg/dL (ref 8.4–10.5)
GFR calc non Af Amer: 67 mL/min — ABNORMAL LOW (ref >90)
Potassium: 3.8 mEq/L (ref 3.5–5.1)
Sodium: 134 mEq/L — ABNORMAL LOW (ref 135–145)

## 2011-03-20 LAB — DIFFERENTIAL
Basophils Absolute: 0 10*3/uL (ref 0.0–0.1)
Eosinophils Absolute: 0.3 10*3/uL (ref 0.0–0.7)
Eosinophils Relative: 3 % (ref 0–5)
Lymphocytes Relative: 13 % (ref 12–46)
Neutrophils Relative %: 74 % (ref 43–77)

## 2011-03-20 LAB — PROTIME-INR
INR: 1.38 (ref 0.00–1.49)
Prothrombin Time: 17.2 seconds — ABNORMAL HIGH (ref 11.6–15.2)

## 2011-03-20 LAB — CBC
MCV: 87.2 fL (ref 78.0–100.0)
Platelets: 190 10*3/uL (ref 150–400)
RDW: 14.2 % (ref 11.5–15.5)
WBC: 8.6 10*3/uL (ref 4.0–10.5)

## 2011-03-20 LAB — GLUCOSE, CAPILLARY
Glucose-Capillary: 127 mg/dL — ABNORMAL HIGH (ref 70–99)
Glucose-Capillary: 135 mg/dL — ABNORMAL HIGH (ref 70–99)

## 2011-03-20 MED ORDER — WARFARIN SODIUM 5 MG PO TABS
5.0000 mg | ORAL_TABLET | Freq: Once | ORAL | Status: AC
  Administered 2011-03-20: 5 mg via ORAL
  Filled 2011-03-20: qty 1

## 2011-03-20 NOTE — Progress Notes (Addendum)
[address]            [place] [id number]          [phone]     5 Days Post-Op  Procedure(s) (LRB): AORTIC VALVE REPLACEMENT (AVR) (N/A) MAZE (N/A) Subjective: Continues to feel better  Objective  Telemetry NSR  Temp:  [98.6 F (37 C)-98.9 F (37.2 C)] 98.9 F (37.2 C) (01/19 0440) Pulse Rate:  [65-70] 66  (01/19 0440) Resp:  [18-20] 18  (01/19 0440) BP: (114-131)/(57-69) 114/57 mmHg (01/19 0440) SpO2:  [94 %-96 %] 94 % (01/19 0440) Weight:  [247 lb 5.7 oz (112.2 kg)] 247 lb 5.7 oz (112.2 kg) (01/19 0409)   Intake/Output Summary (Last 24 hours) at 03/20/11 1001 Last data filed at 03/20/11 0820  Gross per 24 hour  Intake    323 ml  Output   1976 ml  Net  -1653 ml       General appearance: alert and no distress Heart: regular rate and rhythm, S1, S2 normal and soft systolic flow murmur Lungs: clear to auscultation bilaterally Abdomen: soft, mild distension, + BS Extremities: minor lower extremity edema Wound: incision healing well  Lab Results:  Basename 03/20/11 0600 03/19/11 0430  NA 134* 130*  K 3.8 4.2  CL 93* 94*  CO2 30 29  GLUCOSE 118* 134*  BUN 15 14  CREATININE 1.06 0.95  CALCIUM 8.7 8.4  MG -- --  PHOS -- --   No results found for this basename: AST:2,ALT:2,ALKPHOS:2,BILITOT:2,PROT:2,ALBUMIN:2 in the last 72 hours No results found for this basename: LIPASE:2,AMYLASE:2 in the last 72 hours  Basename 03/20/11 0600 03/19/11 0430  WBC 8.6 7.9  NEUTROABS 6.3 --  HGB 8.1* 8.6*  HCT 24.5* 25.4*  MCV 87.2 87.6  PLT 190 149*   No results found for this basename: CKTOTAL:4,CKMB:4,TROPONINI:4 in the last 72 hours No components found with this basename: POCBNP:3 No results found for this basename: DDIMER in the last 72 hours No results found for this basename: HGBA1C in the last 72 hours No results found for this basename: CHOL,HDL,LDLCALC,TRIG,CHOLHDL in the last 72 hours No results found for this basename:  TSH,T4TOTAL,FREET3,T3FREE,THYROIDAB in the last 72 hours No results found for this basename: VITAMINB12,FOLATE,FERRITIN,TIBC,IRON,RETICCTPCT in the last 72 hours  Medications: Scheduled    . amiodarone  200 mg Oral Daily  . aspirin EC  81 mg Oral Daily  . bisacodyl  10 mg Rectal Once  . docusate sodium  200 mg Oral Daily  . enoxaparin  40 mg Subcutaneous QHS  . ferrous gluconate  324 mg Oral BID WC  . furosemide  80 mg Oral Daily  . insulin aspart  0-24 Units Subcutaneous TID AC & HS  . lactulose  30 g Oral Once  . lactulose  30 g Oral Once  . levothyroxine  50 mcg Oral QAC breakfast  . LORazepam  1 mg Oral QHS  . LORazepam  1 mg Oral Once  . metoprolol succinate  25 mg Oral QHS  . moving right along book   Does not apply Once  . pantoprazole  40 mg Oral Q1200  . potassium chloride  40 mEq Oral Daily  . rosuvastatin  20 mg Oral Daily  . sodium chloride  3 mL Intravenous Q12H  . Tamsulosin HCl  0.4 mg Oral QPC supper  . warfarin  5 mg Oral ONCE-1800  Radiology/Studies:  Dg Chest Port 1 View  03/19/2011  *RADIOLOGY REPORT*  Clinical Data: Status post aortic valve repair  PORTABLE CHEST - 1 VIEW  Comparison: 03/17/2011  Findings: There is a left chest wall pacer device with lead in the right atrial appendage and right ventricle.  The heart size appears moderately enlarged.  There is a small left pleural effusion.  Bibasilar atelectasis is unchanged from previous exam.  IMPRESSION:  1.  No change in aeration the lungs compared with previous exam. 2.  Interval resolution of right IJ catheter.  Original Report Authenticated By: Rosealee Albee, M.D.    INR:1.38 Will add last result for INR, ABG once components are confirmed Will add last 4 CBG results once components are confirmed  Assessment/Plan: S/P Procedure(s) (LRB): AORTIC VALVE REPLACEMENT (AVR) (N/A) MAZE (N/A) Mobilize Diuresis pulm toilet, AC rx, will give 5 mg coumadin today On Fe for ABL anemia Poss d/c in  am   LOS: 5 days    GOLD,WAYNE E 1/19/201310:01 AM    I have seen and examined [name] and agree with the above assessment  and plan.  Delight Ovens MD [facility] [phone] Office [phone] 03/20/2011 6:52 PM

## 2011-03-20 NOTE — Progress Notes (Signed)
EPW discontinued per protocol. Tips intact. Patient tolerated well. Last INR INR/Prothrombin Time on   .  Patient advised Bedrest X 1 hour. [name]   

## 2011-03-20 NOTE — Progress Notes (Signed)
CARDIAC REHAB PHASE I   PRE:  Rate/Rhythm: 74  BP:  Supine: 140/80  Sitting:   Standing:    SaO2: 95 RA  MODE:  Ambulation: 500 ft   POST:  Rate/Rhythem: 84  BP:  Supine:   Sitting: 174/84  Standing:   SaO2: 96 RA  [id number]  Ambulated with assist x 1.  Pace good, gait steady.  Reviewed exercise guideline, sternal precautions, restrictions and CR Phase 2.  Also reviewed nutrition. Returned to room, family visiting.  Jackey Loge

## 2011-03-20 NOTE — Progress Notes (Addendum)
Pt seen ambulating in the hall approximately 150 ft with Selena Batten, his private RN, using a RW on RA.  Tolerated well, slow and steady pace.  Returned to his room with RN.  Will continue to monitor.  [name]

## 2011-03-21 LAB — PROTIME-INR
INR: 1.68 — ABNORMAL HIGH (ref 0.00–1.49)
Prothrombin Time: 20.1 seconds — ABNORMAL HIGH (ref 11.6–15.2)

## 2011-03-21 LAB — GLUCOSE, CAPILLARY: Glucose-Capillary: 114 mg/dL — ABNORMAL HIGH (ref 70–99)

## 2011-03-21 MED ORDER — HYDROCODONE-ACETAMINOPHEN 5-325 MG PO TABS: 1.0000 | ORAL_TABLET | ORAL | Status: DC | PRN

## 2011-03-21 MED ORDER — HYDROCHLOROTHIAZIDE 25 MG PO TABS
25.0000 mg | ORAL_TABLET | Freq: Every day | ORAL | Status: DC
  Administered 2011-03-21: 25 mg via ORAL
  Filled 2011-03-21: qty 1

## 2011-03-21 MED ORDER — FERROUS GLUCONATE 324 (38 FE) MG PO TABS: 324.0000 mg | ORAL_TABLET | Freq: Two times a day (BID) | ORAL | Status: DC

## 2011-03-21 NOTE — Discharge Summary (Signed)
[address]            [place] [address]          [phone]      [name] 14-Feb-1935 77 y.o. [id number]  03/15/2011   Alleen Borne, MD  AORTIC STENOSIS  Patient presents with   .  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 He also has a history of atrial fibrillation and has been maintained on amiodarone and Coumadin. He subsequently developed hypothyroidism related to the amiodarone and was started on replacement therapy. With replacement therapy he developed increased atrial arrhythmias and therefore stopped the replacement therapy himself last summer. He developed symptomatic hypothyroidism and therefore restarted the replacement therapy. Since that has been restarted he has developed increased angina. He describes this as being a squeezing pressure in his chest radiating up into his neck and jaw as well as into his left arm. This is related to exertion and has been occurring with less exertion. He is at the point where he can no longer walk his dog without getting chest discomfort. His most recent echocardiogram from 01/29/2011 showed severe aortic stenosis with a peak gradient of 61 mm mercury and a mean gradient of 31 mm mercury. His ejection fraction was 60% with mild left ventricular hypertrophy. His indexed aortic valve area was 0.4cm2/m2. This had not changed much from his previous echo in September of 2012. He subsequently underwent catheterization to rule out recurrent coronary stenosis as the cause of his increased anginal symptoms. Cardiac catheterization showed patent stents in the left circumflex coronary artery and other scattered noncritical coronary disease. His right heart pressures were within normal limits with a cardiac  index of 2.85.  Past Medical History   Diagnosis  Date   .  Paroxysmal atrial fibrillation    .  Chronotropic incompetence with sinus node dysfunction      Status post Guidant dual-mode, dual-pacing, dual-sensing pacemaker implantation now programmed to AAI with recent generator change.   .  Coronary artery disease      status post multiple prior percutaneous coronary interventions, microvascular angina per Dr [name]   .  Aortic stenosis      moderate aortic stenosis   .  Hyperlipidemia    .  Hypercoagulable state      chronically anticoagulated with coumadin    Past Surgical History   Procedure  Date   .  Pacemaker insertion  1991     Guidant PPM, most recent Generator Change by Dr [name] was 08/22/06    Family History   Problem  Relation  Age of Onset   .  Heart disease  Brother       Twin brother has coronary disease and recent AVR for AS   Social History  History   Substance Use Topics   .  Smoking status:  Never Smoker   .  Smokeless tobacco:  Not on file   .  Alcohol Use:  No    Current Outpatient Prescriptions   Medication  Sig  Dispense  Refill   .  HYDROcodone-acetaminophen (NORCO) 5-325 MG per tablet  Take 1 tablet by mouth 2 (two) times  daily.     .  levothyroxine (SYNTHROID, LEVOTHROID) 25 MCG tablet  Take 50 mcg by mouth daily.     Marland Kitchen  LORazepam (ATIVAN) 1 MG tablet  Take 1 mg by mouth once as needed.     Marland Kitchen  amiodarone (PACERONE) 200 MG tablet  Take 200 mg by mouth daily.     Marland Kitchen  aspirin 81 MG tablet  Take 81 mg by mouth daily.     Marland Kitchen  co-enzyme Q-10 30 MG capsule  Take 30 mg by mouth daily.     .  hydrochlorothiazide 25 MG tablet  Take 25 mg by mouth daily.     .  metoprolol (TOPROL-XL) 50 MG 24 hr tablet  Take 50 mg by mouth at bedtime.     .  potassium chloride SA (K-DUR,KLOR-CON) 20 MEQ tablet  Take 30 mEq by mouth daily.     .  rosuvastatin (CRESTOR) 40 MG tablet  Take 20 mg by mouth at bedtime.     .  Tamsulosin HCl (FLOMAX) 0.4 MG CAPS  Take 0.4 mg by mouth  at bedtime.     Marland Kitchen  warfarin (COUMADIN) 4 MG tablet  Take 4 mg by mouth as directed.      Allergies   Allergen  Reactions   .  Contrast Media (Iodinated Diagnostic Agents)  Hives   Review of Systems: At time of consultation General: He denies fever or chills. He said no recent weight changes. He does report some fatigue.  Eyes: Negative  ENT: Negative. He does see a dentist regularly.  Endocrine: he denies diabetes. He has hypothyroidism.  Cardiac: He has exertional chest tightness and pressure radiating to his neck, jaw, and left arm. He has shortness of breath with exertion as well as occasionally at rest. He reports palpitations and always knows when he is in atrial fibrillation. He denies peripheral edema.  Respiratory: He denies cough and sputum production.  GI: He denies nausea vomiting. He has had no melena or bright red blood per rectum.  GU: He denies dysuria and hematuria.  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 He has no significant residual deficit. He denies any focal weakness or numbness. He denies dizziness and syncope.  Musculoskeletal: He denies arthralgias and myalgias.  BP 124/76  Pulse 60  Resp 18  Ht 6\' 2"  (1.88 m)  Wt 225 lb (102.059 kg)  BMI 28.89 kg/m2  SpO2 96%  Physical Exam: At time of consultation He is a well-developed white male in no distress.  HEENT: Normocephalic and atraumatic. Pupils are equal and reactive to light. Extraocular muscles are intact. Oropharynx is clear. Teeth are in good condition.  Neck: Carotid pulses are palpable bilaterally. There no bruits. There is no adenopathy or thyromegaly.  Heart: Regular rate and rhythm with a grade 3/6 systolic murmur over the aorta.  Lungs: Clear  Abdomen: Bowel sounds are present. Soft and nontender. There are no palpable masses organomegaly.  Extremities: No peripheral edema.  Diagnostic Tests:    ------------------------------------------------------------ Transthoracic Echocardiography  Patient: Dermot, Gremillion MR #: [id number] Study Date: 01/29/2011 Gender: M Age: 8 Height: 188cm Weight: 100kg BSA: 2.70m^2 Pt. Status: Room:  ORDERING Shawnie Pons, MD REFERRING Shawnie Pons, MD SONOGRAPHER Luvenia Redden ATTENDING Willa Rough, MD, [facility] PERFORMING [facility], Site 3 cc:  ------------------------------------------------------------ LV EF: 60%  ------------------------------------------------------------ Indications: 424.1 Aortic valve disorders.  ------------------------------------------------------------ History: PMH: Acquired from the patient and from the patient's  chart. Atrial fibrillation. Atrial flutter. Coronary artery disease. Aortic valve disease. Risk factors: Hypertension. Dyslipidemia.  ------------------------------------------------------------ Study Conclusions  - Left ventricle: The cavity size was mildly dilated. Wall thickness was increased in a pattern of mild LVH. The estimated ejection fraction was 60%. Wall motion was normal; there were no regional wall motion abnormalities. - Aortic valve: Calcified with aortic stenosis. The gradients obtained today are very similar to the study of 10/2010. There is one beat with slight increase in velocity at 3.82m/sec. Mean gradient: 31mm Hg (S). Peak gradient: 61mm Hg (S). - Mitral valve: Calcified annulus. - Left atrium: The atrium was moderately dilated. - Right ventricle: Pacer wire or catheter noted in right ventricle. - Right atrium: The atrium was mildly dilated. Pacer wire or catheter noted in right atrium. Transthoracic echocardiography. M-mode, complete 2D, spectral Doppler, and color Doppler. Height: Height: 188cm. Height: 74in. Weight: Weight: 100kg. Weight: 220lb. Body mass index: BMI: 28.3kg/m^2. Body surface area: BSA: 2.26m^2. Patient status: Outpatient. Location: [place]  Site 3  ------------------------------------------------------------  ------------------------------------------------------------ Left ventricle: The cavity size was mildly dilated. Wall thickness was increased in a pattern of mild LVH. The estimated ejection fraction was 60%. Wall motion was normal; there were no regional wall motion abnormalities.  ------------------------------------------------------------ Aortic valve: Calcified with aortic stenosis. The gradients obtained today are very similar to the study of 10/2010. There is one beat with slight increase in velocity at 3.82m/sec. Doppler: VTI ratio of LVOT to aortic valve: 0.2. Indexed valve area: 0.42cm^2/m^2 (VTI). Peak velocity ratio of LVOT to aortic valve: 0.18. Indexed valve area: 0.39cm^2/m^2 (Vmax). Mean gradient: 31mm Hg (S). Peak gradient: 61mm Hg (S).  ------------------------------------------------------------ Mitral valve: Calcified annulus. Doppler: No significant regurgitation.  ------------------------------------------------------------ Left atrium: The atrium was moderately dilated.  ------------------------------------------------------------ Right ventricle: The cavity size was normal. Pacer wire or catheter noted in right ventricle. Systolic function was normal.  ------------------------------------------------------------ Pulmonic valve: The valve appears to be grossly normal. Doppler: No significant regurgitation.  ------------------------------------------------------------ Tricuspid valve: Structurally normal valve. Leaflet separation was normal. Doppler: Transvalvular velocity was within the normal range. Mild regurgitation.  ------------------------------------------------------------ Right atrium: The atrium was mildly dilated. Pacer wire or catheter noted in right atrium.  ------------------------------------------------------------ Pericardium: There was no pericardial  effusion.  ------------------------------------------------------------  2D measurements Normal Doppler measurements Norma Left ventricle l LVID ED, 55.4 mm 43-52 Main pulmonary artery chord, Pressure, 23 mm Hg =30 PLAX S LVID ES, 36.1 mm 23-38 Left ventricle chord, Ea, lat 4.6 cm/s ----- PLAX ann, tiss 1 FS, chord, 35 % >29 DP PLAX E/Ea, lat 10. ----- LVPW, ED 11.2 mm ------ ann, tiss 17 IVS/LVPW 1.03 <1.3 DP ratio, ED Ea, med 5.9 cm/s ----- Ventricular septum ann, tiss 2 IVS, ED 11.5 mm ------ DP LVOT E/Ea, med 7.9 ----- Diam, S 25 mm ------ ann, tiss 2 Area 4.91 cm^2 ------ DP Diam 25 mm ------ LVOT Aorta Peak vel, 71. cm/s ----- Root diam, 37 mm ------ S 4 ED VTI, S 20. cm ----- Left atrium 2 AP dim 52 mm ------ HR 60 bpm ----- AP dim 2.26 cm/m^2 <2.2 Stroke vol 99. ml ----- index 2 Cardiac 5.9 L/min ----- output Cardiac 2.6 L/(min-m ----- index ^2) Stroke 43. ml/m^2 ----- index 1 Aortic valve Peak vel, 389 cm/s ----- S Mean vel, 243 cm/s ----- S VTI, S 103 cm ----- Mean 31 mm Hg ----- gradient, S Peak 61 mm Hg ----- gradient, S VTI ratio 0.2 ----- LVOT/AV Area index 0.4 cm^2/m^2 ----- (VTI) 2 Peak  vel 0.1 ----- ratio, 8 LVOT/AV Area index 0.3 cm^2/m^2 ----- (Vmax) 9 Regurg PHT 342 ms ----- Mitral valve Peak E vel 46. cm/s ----- 9 Peak A vel 72. cm/s ----- 1 Decelerati 197 ms 150-2 on time 30 Peak E/A 0.7 ----- ratio Tricuspid valve Regurg 213 cm/s ----- peak vel Peak RV-RA 18 mm Hg ----- gradient, S Systemic veins Estimated 5 mm Hg ----- CVP Right ventricle Pressure, 23 mm Hg <30 S Sa vel, 10. cm/s ----- lat ann, 9 tiss DP  ------------------------------------------------------------ Prepared and Electronically Authenticated by  Willa Rough, MD, [facility] 2012-11-30T15:[id number]  Procedural Findings:  Hemodynamics  RA 7/6/5  RV 26/5  PA 25/13  PCWP 11  LV Not done  AO 147/82 (106)  Oxygen saturations:  PA 69%  AO 96%   Cardiac Output (Fick) 6.6 L/min  Cardiac Index (Fick) 2.85 L/min/m2  Thermodilution CO: 5.46 L/min  Thermodilution Index: 2.37 L/min/M2  Coronary angiography:  Coronary dominance: right  Left mainstem: The left main coronary artery was a large-caliber vessel that was free of significant disease.  LAD: The left anterior descending artery courses to the cardiac apex there is calcification and mild luminal irregularity throughout the first septal perforator has about 50% narrowing and there is a second septal perforator that has approximately 80-90% narrowing but is a small caliber vessel. The distal left anterior descending artery wraps the apex and provides twin apical vessels. Other than luminal regularity is free of critical disease.  Left Circumflex: The circumflex coronary artery provides a ramus intermedius and a large AV circumflex that supplies a marginal branch. The ramus intermedius bifurcates into superior branch has approximately 40-50% narrowing but supplies a moderate size distal vessel the inferior subbranch appears to be widely patent. The AV portion of the circumflex coronary artery supplies a large marginal branch and has been previously stented. The stents are widely patent.  Right coronary artery (RCA): The right coronary artery is calcified there is moderate calcification in the mid vessel near the acute margin his knee centric 50% area of stenosis, but there is excellent flow through this territory. There is mild luminal irregularity in the distal right coronary. This supplies a posterior descending and posterolateral branches which are small and free of significant disease.  Left ventriculography: Not done  Final Conclusions:  1. Severe aortic stenosis by echocardiography.  2. Continued patency of the circumflex stents.  3. Progressive angina pectoris with thyroid replacement therapy.  4. Scattered non critical CAD      Hospital Course:  The patient was admitted to the  hospital and taken to the operating room on 03/15/2011 and underwent Procedure(s): OPERATIVE REPORT  PREOPERATIVE DIAGNOSES:  1. Severe aortic stenosis.  2. Paroxysmal atrial fibrillation.  POSTOPERATIVE DIAGNOSES:  1. Severe aortic stenosis.  2. Paroxysmal atrial fibrillation.  OPERATIVE PROCEDURE: Median sternotomy, extracorporeal circulation,  left-sided radiofrequency and cryotherapy Maze procedure, ligation of  left atrial appendage, aortic valve replacement using a 25-mm Edwards  Magna-Ease valve.  ATTENDING SURGEON: Evelene Croon, MD.  ASSISTANT: Sheliah Plane, MD  SECOND ASSISTANT: Rowe Clack, PA-C  ANESTHESIA: General endotracheal. He tolerated the procedure well was taken to the surgical intensive care in stable condition.  Post operative hospital course:  The patient has done quite well. He has remained hemodynamically stable. His permanent internal pacemaker is functioning, and he is mostly atrial pacing. He was weaned from the ventilator without difficulty. All routine lines, monitors, and drainage devices have been discontinued in the standard fashion. He does have  an acute blood loss anemia and has required transfusion. His values have been stabilized. He has been started on Coumadin and INRs have been monitored. Incisions are healing well without evidence of infection. He is tolerating routine activities using standard cardiac rehabilitation protocol. Oxygen has been weaned and he maintains good saturations on room air. He was seen and examined this morning and is at this time  quite stable for discharge.  Basename 03/20/11 0600 03/19/11 0430  NA 134* 130*  K 3.8 4.2  CL 93* 94*  CO2 30 29  GLUCOSE 118* 134*  BUN 15 14  CALCIUM 8.7 8.4    Basename 03/20/11 0600 03/19/11 0430  WBC 8.6 7.9  HGB 8.1* 8.6*  HCT 24.5* 25.4*  PLT 190 149*    Basename 03/21/11 0732 03/20/11 0600  INR 1.68* 1.38     Discharge Instructions:  The patient is discharged to home with  extensive instructions on wound care and progressive ambulation.  They are instructed not to drive or perform any heavy lifting until returning to see the physician in his office.  Discharge Diagnosis:  AORTIC STENOSIS  Secondary Diagnosis: Patient Active Problem List  Diagnoses  . HYPERLIPIDEMIA-MIXED  . PRIMARY HYPERCOAGULABLE STATE  . ANGINA, STABLE/EXERTIONAL  . CAD, NATIVE VESSEL  . AORTIC STENOSIS/ INSUFFICIENCY, NON-RHEUMATIC  . Atrial fibrillation  . Chronotropic incompetence with sinus node dysfunction  . CHEST PAIN  . Atrial flutter  . Hypothyroidism   Past Medical History  Diagnosis Date  . Chronotropic incompetence with sinus node dysfunction     Status post Guidant dual-mode, dual-pacing, dual-sensing  pacemaker   implantation now programmed to AAI with recent generator change.  . Coronary artery disease     status post multiple prior percutaneous coronary interventions, microvascular angina per Dr [name]  . Aortic stenosis     moderate aortic stenosis  . Hyperlipidemia   . Hypercoagulable state     chronically anticoagulated with coumadin  . Heart murmur   . Stroke     1990  . Hyperthyroidism   . Benign prostatic hypertrophy   . Arthritis   . Paroxysmal atrial fibrillation     DR. STuckey,   . Hypothyroidism     Dr. Geralyn Flash  Home Medication Instructions QIO:[id number]   Printed on:03/21/11 1152  Medication Information                    warfarin (COUMADIN) 4 MG tablet Take 4 mg by mouth daily.            amiodarone (PACERONE) 200 MG tablet Take 200 mg by mouth daily.            aspirin 81 MG tablet Take 81 mg by mouth daily.            co-enzyme Q-10 30 MG capsule Take 30 mg by mouth daily.            hydrochlorothiazide 25 MG tablet Take 25 mg by mouth daily.            metoprolol (TOPROL-XL) 50 MG 24 hr tablet Take 50 mg by mouth at bedtime.             potassium chloride SA (K-DUR,KLOR-CON) 20 MEQ  tablet Take 30 mEq by mouth daily.            Tamsulosin HCl (FLOMAX) 0.4 MG CAPS Take 0.4 mg by mouth at bedtime.  LORazepam (ATIVAN) 1 MG tablet Take 1 mg by mouth at bedtime as needed. For anxiety           rosuvastatin (CRESTOR) 20 MG tablet Take 20 mg by mouth daily.             Cholecalciferol (VITAMIN D PO) Take 1 tablet by mouth daily.             levothyroxine (SYNTHROID, LEVOTHROID) 50 MCG tablet Take 50 mcg by mouth daily.             Flaxseed, Linseed, (FLAX SEEDS PO) Take 1 tablet by mouth daily.             omega-3 acid ethyl esters (LOVAZA) 1 G capsule Take 1 g by mouth daily.             ferrous gluconate (FERGON) 324 MG tablet Take 1 tablet (324 mg total) by mouth 2 (two) times daily with a meal.           HYDROcodone-acetaminophen (NORCO) 5-325 MG per tablet Take 1 tablet by mouth every 4 (four) hours as needed for pain. For pain             Disposition: Discharged home  Patient's condition is Good  Gershon Crane, PA-C 03/21/2011  11:52 AM

## 2011-03-21 NOTE — Progress Notes (Signed)
Discharged patient home with wife and caregiver. RN reviewed discharge instructions and med rec with pt and caregiver. RN dc's IV with catheter intact. Pt and caregiver verbalized follow up appointments with MD.

## 2011-03-21 NOTE — Progress Notes (Signed)
Patient ambulated 500 ft using a rolling walker on room air with the assistance of his private RN at a moderate pace. Patient is very steady and tolerated ambulation well. Patient return to the bed and is resting. Will continue to monitor.

## 2011-03-21 NOTE — Progress Notes (Addendum)
[address]            [place] [address]          [phone]     6 Days Post-Op  Procedure(s) (LRB): AORTIC VALVE REPLACEMENT (AVR) (N/A) MAZE (N/A) Subjective: Feeling well  Objective  Telemetry Apaced at times, NSR currently  Temp:  [98.2 F (36.8 C)-98.4 F (36.9 C)] 98.4 F (36.9 C) (01/20 0500) Pulse Rate:  [72-80] 73  (01/20 0500) Resp:  [16-18] 18  (01/20 0500) BP: (128-170)/(71-95) 133/75 mmHg (01/20 0500) SpO2:  [91 %-92 %] 91 % (01/20 0500) Weight:  [241 lb 11.2 oz (109.634 kg)] 241 lb 11.2 oz (109.634 kg) (01/20 0350)   Intake/Output Summary (Last 24 hours) at 03/21/11 0757 Last data filed at 03/20/11 1716  Gross per 24 hour  Intake    440 ml  Output   2850 ml  Net  -2410 ml       General appearance: alert and no distress Heart: regular rate and rhythm, S1, S2 normal and systolic aortic flow murmur Lungs: clear to auscultation bilaterally Abdomen: benign exam Extremities: trace edema Wound: incision healing well  Lab Results:  Basename 03/20/11 0600 03/19/11 0430  NA 134* 130*  K 3.8 4.2  CL 93* 94*  CO2 30 29  GLUCOSE 118* 134*  BUN 15 14  CREATININE 1.06 0.95  CALCIUM 8.7 8.4  MG -- --  PHOS -- --   No results found for this basename: AST:2,ALT:2,ALKPHOS:2,BILITOT:2,PROT:2,ALBUMIN:2 in the last 72 hours No results found for this basename: LIPASE:2,AMYLASE:2 in the last 72 hours  Basename 03/20/11 0600 03/19/11 0430  WBC 8.6 7.9  NEUTROABS 6.3 --  HGB 8.1* 8.6*  HCT 24.5* 25.4*  MCV 87.2 87.6  PLT 190 149*   No results found for this basename: CKTOTAL:4,CKMB:4,TROPONINI:4 in the last 72 hours No components found with this basename: POCBNP:3 No results found for this basename: DDIMER in the last 72 hours No results found for this basename: HGBA1C in the last 72 hours No results found for this basename: CHOL,HDL,LDLCALC,TRIG,CHOLHDL in the last 72 hours No results found for this basename:  TSH,T4TOTAL,FREET3,T3FREE,THYROIDAB in the last 72 hours No results found for this basename: VITAMINB12,FOLATE,FERRITIN,TIBC,IRON,RETICCTPCT in the last 72 hours  Medications: Scheduled    . amiodarone  200 mg Oral Daily  . aspirin EC  81 mg Oral Daily  . docusate sodium  200 mg Oral Daily  . enoxaparin  40 mg Subcutaneous QHS  . ferrous gluconate  324 mg Oral BID WC  . furosemide  80 mg Oral Daily  . insulin aspart  0-24 Units Subcutaneous TID AC & HS  . lactulose  30 g Oral Once  . levothyroxine  50 mcg Oral QAC breakfast  . LORazepam  1 mg Oral QHS  . LORazepam  1 mg Oral Once  . metoprolol succinate  25 mg Oral QHS  . pantoprazole  40 mg Oral Q1200  . potassium chloride  40 mEq Oral Daily  . rosuvastatin  20 mg Oral Daily  . sodium chloride  3 mL Intravenous Q12H  . Tamsulosin HCl  0.4 mg Oral QPC supper  . warfarin  5 mg Oral ONCE-1800     Radiology/Studies:  Dg Chest Port 1 View  03/19/2011  *RADIOLOGY REPORT*  Clinical Data: Status post aortic valve repair  PORTABLE CHEST - 1 VIEW  Comparison: 03/17/2011  Findings:  There is a left chest wall pacer device with lead in the right atrial appendage and right ventricle.  The heart size appears moderately enlarged.  There is a small left pleural effusion.  Bibasilar atelectasis is unchanged from previous exam.  IMPRESSION:  1.  No change in aeration the lungs compared with previous exam. 2.  Interval resolution of right IJ catheter.  Original Report Authenticated By: Rosealee Albee, M.D.    [id number] Will add last result for INR, ABG once components are confirmed Will add last 4 CBG results once components are confirmed  Assessment/Plan: S/P Procedure(s) (LRB): AORTIC VALVE REPLACEMENT (AVR) (N/A) MAZE (N/A) 1. Doing well,some systolic htn, will restart hctz. 2. Will see how he feels with ambulation, poss d/c later today   LOS: 6 days    GOLD,WAYNE E 1/20/20137:57 AM    Feels well today. Plan d/c home I have  seen and examined [name] and agree with the above assessment  and plan.  Delight Ovens MD [facility] [phone] Office [phone] 03/21/2011 11:53 AM

## 2011-03-21 NOTE — Progress Notes (Signed)
Patient was shown cardiac video, recovering after surgery.  Will continue to monitor.

## 2011-03-21 NOTE — Progress Notes (Signed)
Patient ambulated 500 ft using a rolling walker on room air with the assistance of his private RN at a moderate pace. Patient is very steady and tolerated ambulation well. Patient return to the bed and is resting. Will continue to monitor. 

## 2011-03-24 ENCOUNTER — Ambulatory Visit (INDEPENDENT_AMBULATORY_CARE_PROVIDER_SITE_OTHER): Payer: Medicare Other | Admitting: Cardiology

## 2011-03-24 ENCOUNTER — Encounter: Payer: Self-pay | Admitting: Cardiology

## 2011-03-24 ENCOUNTER — Ambulatory Visit (INDEPENDENT_AMBULATORY_CARE_PROVIDER_SITE_OTHER)
Admission: RE | Admit: 2011-03-24 | Discharge: 2011-03-24 | Disposition: A | Discharge status: Home / Self Care | Payer: Medicare Other | Source: Ambulatory Visit | Attending: Cardiology | Admitting: Cardiology

## 2011-03-24 VITALS — BP 110/66 | HR 60 | Ht 74.0 in | Wt 235.0 lb

## 2011-03-24 DIAGNOSIS — I359 Nonrheumatic aortic valve disorder, unspecified: Secondary | ICD-10-CM

## 2011-03-24 DIAGNOSIS — E785 Hyperlipidemia, unspecified: Secondary | ICD-10-CM

## 2011-03-24 DIAGNOSIS — Z954 Presence of other heart-valve replacement: Secondary | ICD-10-CM

## 2011-03-24 DIAGNOSIS — I2510 Atherosclerotic heart disease of native coronary artery without angina pectoris: Secondary | ICD-10-CM

## 2011-03-24 DIAGNOSIS — I4891 Unspecified atrial fibrillation: Secondary | ICD-10-CM

## 2011-03-24 DIAGNOSIS — J90 Pleural effusion, not elsewhere classified: Secondary | ICD-10-CM | POA: Diagnosis not present

## 2011-03-24 DIAGNOSIS — Z7901 Long term (current) use of anticoagulants: Secondary | ICD-10-CM | POA: Insufficient documentation

## 2011-03-24 DIAGNOSIS — Z952 Presence of prosthetic heart valve: Secondary | ICD-10-CM

## 2011-03-24 LAB — CBC WITH DIFFERENTIAL/PLATELET
Basophils Relative: 0.4 % (ref 0.0–3.0)
Eosinophils Relative: 2.1 % (ref 0.0–5.0)
Lymphocytes Relative: 15 % (ref 12.0–46.0)
Monocytes Relative: 9.7 % (ref 3.0–12.0)
Neutrophils Relative %: 72.8 % (ref 43.0–77.0)
RBC: 3.27 Mil/uL — ABNORMAL LOW (ref 4.22–5.81)
WBC: 8.7 10*3/uL (ref 4.5–10.5)

## 2011-03-24 LAB — BASIC METABOLIC PANEL
BUN: 18 mg/dL (ref 6–23)
Chloride: 99 mEq/L (ref 96–112)
Glucose, Bld: 114 mg/dL — ABNORMAL HIGH (ref 70–99)
Potassium: 3.8 mEq/L (ref 3.5–5.1)

## 2011-03-24 NOTE — Assessment & Plan Note (Signed)
Scattered CAD.  No bypass.  Will need to be watched closely for progression of disease.

## 2011-03-24 NOTE — Assessment & Plan Note (Signed)
Resolved following maze.  Goal is to get off of Amio within the next two months.

## 2011-03-24 NOTE — Assessment & Plan Note (Signed)
No murmur.  Doing well post op.  Good experience.  Was anemic and required 2u prbc.  Will recheck labs today.

## 2011-03-24 NOTE — Assessment & Plan Note (Signed)
Exam suggests currently.  Will recheck CXR.  If larger, will change back to furosemide.

## 2011-03-24 NOTE — Assessment & Plan Note (Signed)
Continue statin. 

## 2011-03-24 NOTE — Patient Instructions (Signed)
Your physician recommends that you have lab work today: CBC, BMP  Please have a chest x-ray performed at the [place] office on [facility].   Your physician recommends that you schedule a follow-up appointment in: 2 WEEKS

## 2011-03-24 NOTE — Progress Notes (Signed)
HPI:  Had a good experience with his surgery.  A little weak today, feels like he is more anemic.  Has some chest discomfort, but expected it, and it is not too bad. He says he did a little too much yesterday.    Current Outpatient Prescriptions  Medication Sig Dispense Refill  . amiodarone (PACERONE) 200 MG tablet Take 200 mg by mouth daily.       Marland Kitchen aspirin 81 MG tablet Take 81 mg by mouth daily.       . bisacodyl (DULCOLAX) 5 MG EC tablet Take 5 mg by mouth daily as needed.      . Cholecalciferol (VITAMIN D PO) Take 1 tablet by mouth daily.        . ciprofloxacin (CIPRO) 750 MG tablet Take 750 mg by mouth 2 (two) times daily.      Marland Kitchen co-enzyme Q-10 30 MG capsule Take 30 mg by mouth daily.       . ferrous gluconate (FERGON) 324 MG tablet Take 1 tablet (324 mg total) by mouth 2 (two) times daily with a meal.  60 tablet  1  . fish oil-omega-3 fatty acids 1000 MG capsule Take 1 g by mouth daily.      . Flaxseed, Linseed, (FLAX SEEDS PO) Take 1 tablet by mouth daily.        . hydrochlorothiazide 25 MG tablet Take 25 mg by mouth daily.       Marland Kitchen HYDROcodone-acetaminophen (NORCO) 5-325 MG per tablet Take 1 tablet by mouth every 4 (four) hours as needed for pain. For pain  50 tablet  0  . levothyroxine (SYNTHROID, LEVOTHROID) 50 MCG tablet Take 50 mcg by mouth daily.        Marland Kitchen LORazepam (ATIVAN) 1 MG tablet Take 1 mg by mouth at bedtime as needed. For anxiety      . metoprolol (TOPROL-XL) 50 MG 24 hr tablet Take 50 mg by mouth at bedtime.        Marland Kitchen omega-3 acid ethyl esters (LOVAZA) 1 G capsule Take 1 g by mouth daily.        . polyethylene glycol (MIRALAX / GLYCOLAX) packet Take 17 g by mouth as needed.      . potassium chloride (K-DUR,KLOR-CON) 10 MEQ tablet Take 10 mEq by mouth 3 (three) times daily.      . rosuvastatin (CRESTOR) 20 MG tablet Take 20 mg by mouth daily.        . Tamsulosin HCl (FLOMAX) 0.4 MG CAPS Take 0.4 mg by mouth at bedtime.        Marland Kitchen warfarin (COUMADIN) 4 MG tablet Take 4 mg  by mouth daily.         Allergies  Allergen Reactions  . Contrast Media (Iodinated Diagnostic Agents) Hives    Past Medical History  Diagnosis Date  . Chronotropic incompetence with sinus node dysfunction     Status post Guidant dual-mode, dual-pacing, dual-sensing  pacemaker   implantation now programmed to AAI with recent generator change.  . Coronary artery disease     status post multiple prior percutaneous coronary interventions, microvascular angina per Dr [name]  . Aortic stenosis     moderate aortic stenosis  . Hyperlipidemia   . Hypercoagulable state     chronically anticoagulated with coumadin  . Heart murmur   . Stroke     1990  . Hyperthyroidism   . Benign prostatic hypertrophy   . Arthritis   . Paroxysmal atrial fibrillation  DR. Riley Kill,   . Hypothyroidism     Dr. [name]    Past Surgical History  Procedure Date  . Pacemaker insertion 1991    Guidant PPM, most recent Generator Change by Dr [name] was 08/22/06  . Hemrrhoidectomy   . Cardiac catheterization     11  . Cardioversion   . Appendectomy   . Aortic valve replacement 03/15/2011    Procedure: AORTIC VALVE REPLACEMENT (AVR);  Surgeon: Alleen Borne, MD;  Location: [facility] OR;  Service: Open Heart Surgery;  Laterality: N/A;  . Maze 03/15/2011    Procedure: MAZE;  Surgeon: Alleen Borne, MD;  Location: [facility] OR;  Service: Open Heart Surgery;  Laterality: N/A;    Family History  Problem Relation Age of Onset  . Heart disease Brother     Twin brother has coronary disease and recent AVR for AS    History   Social History  . Marital Status: Married    Spouse Name: N/A    Number of Children: N/A  . Years of Education: N/A   Occupational History  . Not on file.   Social History Main Topics  . Smoking status: Never Smoker   . Smokeless tobacco: Not on file  . Alcohol Use: No     occassional  . Drug Use: No  . Sexually Active: Not on file   Other Topics Concern  . Not on file   Social  History Narrative  . No narrative on file    ROS: Please see the HPI.  All other systems reviewed and negative.  PHYSICAL EXAM:  BP 110/66  Pulse 60  Ht 6\' 2"  (1.88 m)  Wt 106.595 kg (235 lb)  BMI 30.17 kg/m2  General: Well developed, well nourished, in no acute distress. Head:  Normocephalic and atraumatic. Neck: no JVD Lungs: Dullness to percussion in the bases, with decrease BS, but no rales.  Heart: Normal S1 and S2.   Pos S4 gallop.  No murmur, rubs or gallops.  Pulses: Pulses normal in all 4 extremities. Extremities: No clubbing or cyanosis. Trace edema.   Neurologic: Alert and oriented x 3.   EKG:  Atrial pacing, RBBB, LVH with repolarization change.  ASSESSMENT AND PLAN:

## 2011-03-25 ENCOUNTER — Encounter: Payer: Self-pay | Admitting: Surgery

## 2011-04-05 ENCOUNTER — Telehealth: Payer: Self-pay | Admitting: Cardiology

## 2011-04-05 DIAGNOSIS — R300 Dysuria: Secondary | ICD-10-CM

## 2011-04-05 DIAGNOSIS — I2510 Atherosclerotic heart disease of native coronary artery without angina pectoris: Secondary | ICD-10-CM

## 2011-04-05 DIAGNOSIS — I4891 Unspecified atrial fibrillation: Secondary | ICD-10-CM

## 2011-04-05 DIAGNOSIS — I359 Nonrheumatic aortic valve disorder, unspecified: Secondary | ICD-10-CM

## 2011-04-05 NOTE — Telephone Encounter (Signed)
New Msg: [name] calling wanting to know if UA for burning with urination will labs tomorrow. Please return call to discuss further.

## 2011-04-05 NOTE — Telephone Encounter (Signed)
Kim DR. Stevphen Rochester secretary called. She said pt spoke with Dr. Riley Kill yesterday regarding getting some lab work this coming Wednesday. He  Would like to have it done tomorrow instead and add urinalysis to the labs because he a got burning sensation with  Urination.  Selena Batten is aware that Dr. Riley Kill is not in the office today . I will sent MD this  message. We will call her back as soon as we have a respond.

## 2011-04-06 NOTE — Telephone Encounter (Signed)
I spoke with Dr Riley Kill and he would like the pt to have a BMP, CBC and Urinalysis.  I made Kim aware of this information and she said Dr [name] is going to come into the office on Wednesday for labs.

## 2011-04-07 ENCOUNTER — Other Ambulatory Visit (INDEPENDENT_AMBULATORY_CARE_PROVIDER_SITE_OTHER): Payer: Medicare Other | Admitting: *Deleted

## 2011-04-07 ENCOUNTER — Ambulatory Visit (INDEPENDENT_AMBULATORY_CARE_PROVIDER_SITE_OTHER): Payer: Medicare Other | Admitting: Cardiology

## 2011-04-07 ENCOUNTER — Ambulatory Visit (INDEPENDENT_AMBULATORY_CARE_PROVIDER_SITE_OTHER): Payer: Medicare Other | Admitting: *Deleted

## 2011-04-07 DIAGNOSIS — I2510 Atherosclerotic heart disease of native coronary artery without angina pectoris: Secondary | ICD-10-CM | POA: Diagnosis not present

## 2011-04-07 DIAGNOSIS — I951 Orthostatic hypotension: Secondary | ICD-10-CM

## 2011-04-07 DIAGNOSIS — D6859 Other primary thrombophilia: Secondary | ICD-10-CM | POA: Diagnosis not present

## 2011-04-07 DIAGNOSIS — E039 Hypothyroidism, unspecified: Secondary | ICD-10-CM

## 2011-04-07 DIAGNOSIS — I4891 Unspecified atrial fibrillation: Secondary | ICD-10-CM

## 2011-04-07 DIAGNOSIS — I359 Nonrheumatic aortic valve disorder, unspecified: Secondary | ICD-10-CM

## 2011-04-07 DIAGNOSIS — Z7901 Long term (current) use of anticoagulants: Secondary | ICD-10-CM

## 2011-04-07 DIAGNOSIS — J90 Pleural effusion, not elsewhere classified: Secondary | ICD-10-CM

## 2011-04-07 DIAGNOSIS — Z954 Presence of other heart-valve replacement: Secondary | ICD-10-CM

## 2011-04-07 DIAGNOSIS — R300 Dysuria: Secondary | ICD-10-CM

## 2011-04-07 DIAGNOSIS — Z952 Presence of prosthetic heart valve: Secondary | ICD-10-CM

## 2011-04-07 LAB — POCT INR: INR: 3.6

## 2011-04-07 LAB — CBC WITH DIFFERENTIAL/PLATELET
Basophils Relative: 0.7 % (ref 0.0–3.0)
Eosinophils Absolute: 0.2 10*3/uL (ref 0.0–0.7)
MCHC: 33.2 g/dL (ref 30.0–36.0)
MCV: 87.7 fl (ref 78.0–100.0)
Monocytes Absolute: 0.4 10*3/uL (ref 0.1–1.0)
Neutrophils Relative %: 75.3 % (ref 43.0–77.0)
Platelets: 297 10*3/uL (ref 150.0–400.0)
RBC: 3.9 Mil/uL — ABNORMAL LOW (ref 4.22–5.81)
RDW: 14.7 % — ABNORMAL HIGH (ref 11.5–14.6)

## 2011-04-07 LAB — BASIC METABOLIC PANEL
BUN: 13 mg/dL (ref 6–23)
GFR: 75.76 mL/min (ref >60.00)
Potassium: 3.8 mEq/L (ref 3.5–5.1)

## 2011-04-07 NOTE — Patient Instructions (Signed)
Pt will start HCTZ at 12.5mg  daily, Flomax every other day and take potassium 2 daily.  We will call after lab results are available.

## 2011-04-08 ENCOUNTER — Other Ambulatory Visit: Payer: Self-pay | Admitting: Surgery

## 2011-04-08 DIAGNOSIS — I359 Nonrheumatic aortic valve disorder, unspecified: Secondary | ICD-10-CM

## 2011-04-08 LAB — URINALYSIS
Bilirubin Urine: NEGATIVE
Ketones, ur: NEGATIVE
Leukocytes, UA: NEGATIVE
Specific Gravity, Urine: 1.01 (ref 1.000–1.030)
Urobilinogen, UA: 0.2 (ref 0.0–1.0)

## 2011-04-09 ENCOUNTER — Ambulatory Visit: Payer: Medicare Other | Admitting: Cardiology

## 2011-04-11 DIAGNOSIS — I951 Orthostatic hypotension: Secondary | ICD-10-CM | POA: Insufficient documentation

## 2011-04-11 NOTE — Assessment & Plan Note (Signed)
Clinically seems less, but scheduled for xray prior to visit with Dr. Laneta Simmers.

## 2011-04-11 NOTE — Assessment & Plan Note (Signed)
Non obstructive.  Will need long term aggressive RFR to avoid the need for later revascularization.  Has responded well to stenting in the past.

## 2011-04-11 NOTE — Assessment & Plan Note (Signed)
He is better after holding meds.  He has trouble going to the bathroom though.  We have adjusted the metoprolol a bit.  Will need to monitor.

## 2011-04-11 NOTE — Assessment & Plan Note (Signed)
TSH was normalizing.  Being followed by Dr. [name]

## 2011-04-11 NOTE — Assessment & Plan Note (Signed)
Thyroid now normalized, and currently on Amio.  Hope is for eventual dc of Amio, and I would consider that in a couple of months after MAZE, but for now would not initiate to avoid recurrence.

## 2011-04-11 NOTE — Assessment & Plan Note (Signed)
Slowly improving after surgery, gradually gaining strength without limiting angina at this point.

## 2011-04-11 NOTE — Assessment & Plan Note (Signed)
INR is now back out, and being adjusted by coumadin clinic.

## 2011-04-11 NOTE — Progress Notes (Signed)
HPI:  Doing well following recent surgery.  Walking without any angina.  No progressive shortness of breath.  BP had been a little low, and he was orthostatic, but was on flomax.  INR was done today in Coumadin clinic.    Current Outpatient Prescriptions  Medication Sig Dispense Refill  . amiodarone (PACERONE) 200 MG tablet Take 200 mg by mouth daily.       Marland Kitchen aspirin 81 MG tablet Take 81 mg by mouth daily.       . bisacodyl (DULCOLAX) 5 MG EC tablet Take 5 mg by mouth daily as needed.      . Cholecalciferol (VITAMIN D PO) Take 2,000 Units by mouth daily.       Marland Kitchen co-enzyme Q-10 30 MG capsule Take 30 mg by mouth daily.       . ferrous gluconate (FERGON) 324 MG tablet Take 1 tablet (324 mg total) by mouth 2 (two) times daily with a meal.  60 tablet  1  . fish oil-omega-3 fatty acids 1000 MG capsule Take 1 g by mouth daily.      Marland Kitchen HYDROcodone-acetaminophen (NORCO) 5-325 MG per tablet Take 1 tablet by mouth every 4 (four) hours as needed for pain. For pain  50 tablet  0  . levothyroxine (SYNTHROID, LEVOTHROID) 50 MCG tablet Take 50 mcg by mouth daily.        Marland Kitchen LORazepam (ATIVAN) 1 MG tablet Take 1 mg by mouth at bedtime as needed. For anxiety      . metoprolol (TOPROL-XL) 50 MG 24 hr tablet 50 mg. Take one-half tablet by mouth twice a day      . polyethylene glycol (MIRALAX / GLYCOLAX) packet Take 17 g by mouth as needed.      . potassium chloride (K-DUR,KLOR-CON) 10 MEQ tablet Take 1 tablet (10 mEq total) by mouth 2 (two) times daily.      . rosuvastatin (CRESTOR) 20 MG tablet Take 20 mg by mouth daily.        Marland Kitchen warfarin (COUMADIN) 4 MG tablet Take 4 mg by mouth daily.       . hydrochlorothiazide (HYDRODIURIL) 25 MG tablet Take 0.5 tablets (12.5 mg total) by mouth daily.      . Tamsulosin HCl (FLOMAX) 0.4 MG CAPS Take 1 capsule (0.4 mg total) by mouth at bedtime every other day  1 capsule      Allergies  Allergen Reactions  . Contrast Media (Iodinated Diagnostic Agents) Hives    Past  Medical History  Diagnosis Date  . Chronotropic incompetence with sinus node dysfunction     Status post Guidant dual-mode, dual-pacing, dual-sensing  pacemaker   implantation now programmed to AAI with recent generator change.  . Coronary artery disease     status post multiple prior percutaneous coronary interventions, microvascular angina per Dr [name]  . Aortic stenosis     moderate aortic stenosis  . Hyperlipidemia   . Hypercoagulable state     chronically anticoagulated with coumadin  . Heart murmur   . Stroke     1990  . Hyperthyroidism   . Benign prostatic hypertrophy   . Arthritis   . Paroxysmal atrial fibrillation     DR. Fatin Bachicha,   . Hypothyroidism     Dr. [name]    Past Surgical History  Procedure Date  . Pacemaker insertion 1991    Guidant PPM, most recent Generator Change by Dr [name] was 08/22/06  . Hemrrhoidectomy   . Cardiac catheterization  11  . Cardioversion   . Appendectomy   . Aortic valve replacement 03/15/2011    Procedure: AORTIC VALVE REPLACEMENT (AVR);  Surgeon: Alleen Borne, MD;  Location: [facility] OR;  Service: Open Heart Surgery;  Laterality: N/A;  . Maze 03/15/2011    Procedure: MAZE;  Surgeon: Alleen Borne, MD;  Location: [facility] OR;  Service: Open Heart Surgery;  Laterality: N/A;    Family History  Problem Relation Age of Onset  . Heart disease Brother     Twin brother has coronary disease and recent AVR for AS    History   Social History  . Marital Status: Married    Spouse Name: N/A    Number of Children: N/A  . Years of Education: N/A   Occupational History  . Not on file.   Social History Main Topics  . Smoking status: Never Smoker   . Smokeless tobacco: Not on file  . Alcohol Use: No     occassional  . Drug Use: No  . Sexually Active: Not on file   Other Topics Concern  . Not on file   Social History Narrative  . No narrative on file    ROS: Please see the HPI.  All other systems reviewed and  negative.  PHYSICAL EXAM:  BP 130/76  Pulse 66  SpO2 96%  General: Well developed, well nourished, in no acute distress. Head:  Normocephalic and atraumatic. Neck: no JVD Lungs: Clear throughout without rales, slight decrease at left base, but no increased dullness to percussioin. Heart: Normal S1 and S2.  SEM  1/6, no DM Pulses: Pulses normal in all 4 extremities. Extremities: No clubbing or cyanosis. No edema. Neurologic: Alert and oriented x 3.  EKG:  ASSESSMENT AND PLAN:

## 2011-04-12 ENCOUNTER — Ambulatory Visit (INDEPENDENT_AMBULATORY_CARE_PROVIDER_SITE_OTHER): Payer: Medicare Other

## 2011-04-12 ENCOUNTER — Ambulatory Visit (INDEPENDENT_AMBULATORY_CARE_PROVIDER_SITE_OTHER): Payer: Medicare Other | Admitting: Internal Medicine

## 2011-04-12 ENCOUNTER — Encounter: Payer: Medicare Other | Admitting: Internal Medicine

## 2011-04-12 ENCOUNTER — Encounter: Payer: Self-pay | Admitting: Internal Medicine

## 2011-04-12 ENCOUNTER — Ambulatory Visit
Admission: RE | Admit: 2011-04-12 | Discharge: 2011-04-12 | Disposition: A | Discharge status: Home / Self Care | Payer: Medicare Other | Source: Ambulatory Visit | Attending: Surgery | Admitting: Surgery

## 2011-04-12 ENCOUNTER — Encounter: Payer: Self-pay | Admitting: *Deleted

## 2011-04-12 VITALS — BP 92/62 | HR 108

## 2011-04-12 DIAGNOSIS — Z7901 Long term (current) use of anticoagulants: Secondary | ICD-10-CM | POA: Diagnosis not present

## 2011-04-12 DIAGNOSIS — I4891 Unspecified atrial fibrillation: Secondary | ICD-10-CM | POA: Diagnosis not present

## 2011-04-12 DIAGNOSIS — J90 Pleural effusion, not elsewhere classified: Secondary | ICD-10-CM | POA: Diagnosis not present

## 2011-04-12 DIAGNOSIS — I359 Nonrheumatic aortic valve disorder, unspecified: Secondary | ICD-10-CM

## 2011-04-12 DIAGNOSIS — Z954 Presence of other heart-valve replacement: Secondary | ICD-10-CM

## 2011-04-12 DIAGNOSIS — J9819 Other pulmonary collapse: Secondary | ICD-10-CM | POA: Diagnosis not present

## 2011-04-12 DIAGNOSIS — R0602 Shortness of breath: Secondary | ICD-10-CM | POA: Diagnosis not present

## 2011-04-12 NOTE — Patient Instructions (Signed)
Your physician has requested that you have a TEE/Cardioversion. During a TEE, sound waves are used to create images of your heart. It provides your doctor with information about the size and shape of your heart and how well your heart's chambers and valves are working. In this test, a transducer is attached to the end of a flexible tube that is guided down you throat and into your esophagus (the tube leading from your mouth to your stomach) to get a more detailed image of your heart. Once the TEE has determined that a blood clot is not present, the cardioversion begins. Electrical Cardioversion uses a jolt of electricity to your heart either through paddles or wired patches attached to your chest. This is a controlled, usually prescheduled, procedure. This procedure is done at the hospital and you are not awake during the procedure. You usually go home the day of the procedure. Please see the instruction sheet given to you today for more information.  Your physician recommends that you have lab work today: tsh

## 2011-04-13 ENCOUNTER — Encounter: Payer: Self-pay | Admitting: Surgery

## 2011-04-13 ENCOUNTER — Encounter: Payer: Self-pay | Admitting: Internal Medicine

## 2011-04-13 ENCOUNTER — Ambulatory Visit (INDEPENDENT_AMBULATORY_CARE_PROVIDER_SITE_OTHER): Payer: Self-pay | Admitting: Surgery

## 2011-04-13 VITALS — BP 104/71 | HR 88 | Resp 16 | Wt 202.4 lb

## 2011-04-13 DIAGNOSIS — Z09 Encounter for follow-up examination after completed treatment for conditions other than malignant neoplasm: Secondary | ICD-10-CM

## 2011-04-13 DIAGNOSIS — I359 Nonrheumatic aortic valve disorder, unspecified: Secondary | ICD-10-CM

## 2011-04-13 NOTE — Assessment & Plan Note (Signed)
The patient has gone out of rhythm one month out from his maze surgery. I recommended that he undergo a TSH check, increase his amiodarone to 200 mg twice a day, and return if couple of days for TEE guided cardioversion if his thyroid function is stable. Unfortunately, his INR is subtherapeutic. Hopefully by the time of his transesophageal echo, will become therapeutic.

## 2011-04-13 NOTE — Progress Notes (Signed)
[address]            [place] [address]          [phone]      HPI:  Patient returns for routine postoperative follow-up having undergone aortic valve replacement using a 25 mm Edwards pericardial bioprosthesis and left-sided radiofrequency and cryomaze procedure with ligation of the left atrial appendage on 03/15/2011. The patient's early postoperative recovery while in the hospital was notable for development of an ileus which resolved on its own. He had no atrial arrhythmias. Since hospital discharge the patient reports that he has felt well until 2 days ago when he developed atrial flutter with a rapid ventricular response. He was seen by Dr. Sharrell Ku and an attempt at rapid atrial pacing was unsuccessful. He is scheduled for a TEE directed cardioversion on Thursday. He says that his heart rate still speeds up when he is ambulatory and makes him tired and a little dizzy. He has had no further angina which was his main complaint preoperatively.   Current Outpatient Prescriptions  Medication Sig Dispense Refill  . amiodarone (PACERONE) 200 MG tablet Take 200 mg by mouth 2 (two) times daily.       Marland Kitchen aspirin 81 MG tablet Take 81 mg by mouth daily.       . bisacodyl (DULCOLAX) 5 MG EC tablet Take 5 mg by mouth daily as needed.      . Cholecalciferol (VITAMIN D PO) Take 2,000 Units by mouth daily.       Marland Kitchen co-enzyme Q-10 30 MG capsule Take 30 mg by mouth daily.       . ferrous gluconate (FERGON) 324 MG tablet Take 1 tablet (324 mg total) by mouth 2 (two) times daily with a meal.  60 tablet  1  . fish oil-omega-3 fatty acids 1000 MG capsule Take 1 g by mouth daily.      . hydrochlorothiazide (HYDRODIURIL) 25 MG tablet Take 0.5 tablets (12.5 mg total) by mouth daily.      Marland Kitchen HYDROcodone-acetaminophen (NORCO) 5-325 MG per tablet Take 1 tablet by mouth every 4 (four) hours as needed for pain. For pain  50 tablet  0  . levothyroxine (SYNTHROID,  LEVOTHROID) 50 MCG tablet Take 50 mcg by mouth daily.        Marland Kitchen LORazepam (ATIVAN) 1 MG tablet Take 1 mg by mouth at bedtime as needed. For anxiety      . metoprolol (TOPROL-XL) 50 MG 24 hr tablet 50 mg. Take one-half tablet by mouth twice a day      . polyethylene glycol (MIRALAX / GLYCOLAX) packet Take 17 g by mouth as needed.      . potassium chloride (K-DUR,KLOR-CON) 10 MEQ tablet Take 1 tablet (10 mEq total) by mouth 2 (two) times daily.      . rosuvastatin (CRESTOR) 20 MG tablet Take 20 mg by mouth daily.        . Tamsulosin HCl (FLOMAX) 0.4 MG CAPS Take 1 capsule (0.4 mg total) by mouth at bedtime every other day  1 capsule    . warfarin (COUMADIN) 4 MG tablet Take 4 mg by mouth daily.         Physical Exam:  BP 104/71  Pulse 88  Resp 16  Wt 202 lb 6.4 oz (91.808 kg)  SpO2 94% He looks tired but in no distress. Cardiac exam  shows a regularly irregular rhythm. There is no murmur. Lung exam is clear. Chest incision is healing well and sternum is stable. There is no peripheral edema.  Diagnostic Tests:  Chest x-ray from yesterday shows improved aeration compared to his previous x-ray and minimal left pleural effusion.  Impression:  Overall I think he is doing well following his aortic valve replacement. He has recurrent symptomatic atrial flutter which I don't think he has had since last summer. We only did a left-sided Maze procedure since he had a permanent dual-chamber pacemaker in the right atrium and that would've likely been disrupted by performing a Maze procedure on the right atrium. If he continues to have problems with atrial flutter he may need to be considered for an ablation. 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  Plan:  He will continue to followup with Dr. Riley Kill and Dr. [name] and I will see him back in 2 months for  followup.

## 2011-04-13 NOTE — Progress Notes (Signed)
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 He is concerned he has gone back out of rhythm. He is a 77-year-old man with aortic stenosis who underwent aortic valve replacement several weeks ago. At that time he had a surgical maze procedure performed. The patient had done quite well until the evening prior to his clinic visit. He noted palpitations and a sense that his heart was racing. He is referred now for additional evaluation. Of note, the patient has had therapeutic INRs but today his INR was less than 2. Allergies  Allergen Reactions  . Contrast Media (Iodinated Diagnostic Agents) Hives     Current Outpatient Prescriptions  Medication Sig Dispense Refill  . amiodarone (PACERONE) 200 MG tablet Take 200 mg by mouth 2 (two) times daily.       . aspirin 81 MG tablet Take 81 mg by mouth daily.       . bisacodyl (DULCOLAX) 5 MG EC tablet Take 5 mg by mouth daily as needed.      . Cholecalciferol (VITAMIN D PO) Take 2,000 Units by mouth daily.       . co-enzyme Q-10 30 MG capsule Take 30 mg by mouth daily.       . ferrous gluconate (FERGON) 324 MG tablet Take 1 tablet (324 mg total) by mouth 2 (two) times daily with a meal.  60 tablet  1  . fish oil-omega-3 fatty acids 1000 MG capsule Take 1 g by mouth daily.      . hydrochlorothiazide (HYDRODIURIL) 25 MG tablet Take 0.5 tablets (12.5 mg total) by mouth daily.      . HYDROcodone-acetaminophen (NORCO) 5-325 MG per tablet Take 1 tablet by mouth every 4 (four) hours as needed for pain. For pain  50 tablet  0  . levothyroxine (SYNTHROID, LEVOTHROID) 50 MCG tablet Take 50 mcg by mouth daily.        . LORazepam (ATIVAN) 1 MG tablet Take 1 mg by mouth at bedtime as needed. For anxiety      . metoprolol (TOPROL-XL) 50 MG 24 hr tablet 50 mg. Take one-half tablet by mouth twice a day      . polyethylene glycol (MIRALAX / GLYCOLAX) packet Take 17 g by mouth as needed.      . potassium chloride (K-DUR,KLOR-CON) 10  MEQ tablet Take 1 tablet (10 mEq total) by mouth 2 (two) times daily.      . rosuvastatin (CRESTOR) 20 MG tablet Take 20 mg by mouth daily.        . Tamsulosin HCl (FLOMAX) 0.4 MG CAPS Take 1 capsule (0.4 mg total) by mouth at bedtime every other day  1 capsule    . warfarin (COUMADIN) 4 MG tablet Take 4 mg by mouth daily.          Past Medical History  Diagnosis Date  . Chronotropic incompetence with sinus node dysfunction     Status post Guidant dual-mode, dual-pacing, dual-sensing  pacemaker   implantation now programmed to AAI with recent generator change.  . Coronary artery disease     status post multiple prior percutaneous coronary interventions, microvascular angina per Dr Brodie  . Aortic stenosis     moderate aortic stenosis  . Hyperlipidemia   . Hypercoagulable state     chronically anticoagulated with coumadin  . Heart murmur   . Stroke     1990  . Hyperthyroidism   . Benign prostatic hypertrophy   . Arthritis   . Paroxysmal atrial   fibrillation     DR. STuckey,   . Hypothyroidism     Dr. Altheimer    ROS:   All systems reviewed and negative except as noted in the HPI.   Past Surgical History  Procedure Date  . Pacemaker insertion 1991    Guidant PPM, most recent Generator Change by Dr Brodie was 08/22/06  . Hemrrhoidectomy   . Cardiac catheterization     11  . Cardioversion   . Appendectomy   . Aortic valve replacement 03/15/2011    Procedure: AORTIC VALVE REPLACEMENT (AVR);  Surgeon: Bryan K Bartle, MD;  Location: [facility] OR;  Service: Open Heart Surgery;  Laterality: N/A;  . Maze 03/15/2011    Procedure: MAZE;  Surgeon: Bryan K Bartle, MD;  Location: [facility] OR;  Service: Open Heart Surgery;  Laterality: N/A;     Family History  Problem Relation Age of Onset  . Heart disease Brother     Twin brother has coronary disease and recent AVR for AS     History   Social History  . Marital Status: Married    Spouse Name: N/A    Number of Children: N/A  . Years  of Education: N/A   Occupational History  . Not on file.   Social History Main Topics  . Smoking status: Never Smoker   . Smokeless tobacco: Not on file  . Alcohol Use: No     occassional  . Drug Use: No  . Sexually Active: Not on file   Other Topics Concern  . Not on file   Social History Narrative  . No narrative on file     BP 92/62  Pulse 108  Physical Exam:  Well appearing 77-year-old man,  NAD HEENT: Unremarkable Neck:  No JVD, no thyromegally Lungs:  Clear with no wheezes, rales, or rhonchi. HEART:  IRegular tachycardic rhythm, no murmurs, no rubs, no clicks Abd:  soft, positive bowel sounds, no organomegally, no rebound, no guarding Ext:  2 plus pulses, no edema, no cyanosis, no clubbing Skin:  No rashes no nodules Neuro:  CN II through XII intact, motor grossly intact  EKG Atrial fib/flutter with a rapid ventricular response DEVICE  Normal device function.  See PaceArt for details. Atrial flutter/fib.   Assess/Plan:   

## 2011-04-15 ENCOUNTER — Encounter (HOSPITAL_COMMUNITY): Payer: Self-pay | Admitting: Anesthesiology

## 2011-04-15 ENCOUNTER — Encounter (HOSPITAL_COMMUNITY): Payer: Self-pay | Admitting: *Deleted

## 2011-04-15 ENCOUNTER — Ambulatory Visit (INDEPENDENT_AMBULATORY_CARE_PROVIDER_SITE_OTHER): Payer: Medicare Other | Admitting: *Deleted

## 2011-04-15 ENCOUNTER — Ambulatory Visit (HOSPITAL_COMMUNITY)
Admission: RE | Admit: 2011-04-15 | Discharge: 2011-04-15 | Disposition: A | Discharge status: Home / Self Care | Payer: Medicare Other | Source: Ambulatory Visit | Attending: Cardiology | Admitting: Cardiology

## 2011-04-15 ENCOUNTER — Ambulatory Visit (HOSPITAL_COMMUNITY): Payer: Medicare Other | Admitting: Anesthesiology

## 2011-04-15 ENCOUNTER — Encounter (HOSPITAL_COMMUNITY)
Admission: RE | Disposition: A | Discharge status: Home / Self Care | Payer: Self-pay | Source: Ambulatory Visit | Attending: Cardiology

## 2011-04-15 DIAGNOSIS — I4891 Unspecified atrial fibrillation: Secondary | ICD-10-CM

## 2011-04-15 DIAGNOSIS — Z7901 Long term (current) use of anticoagulants: Secondary | ICD-10-CM | POA: Diagnosis not present

## 2011-04-15 DIAGNOSIS — Z954 Presence of other heart-valve replacement: Secondary | ICD-10-CM

## 2011-04-15 HISTORY — PX: CARDIOVERSION: SHX1299

## 2011-04-15 HISTORY — PX: TEE WITHOUT CARDIOVERSION: SHX5443

## 2011-04-15 SURGERY — ECHOCARDIOGRAM, TRANSESOPHAGEAL
Anesthesia: Moderate Sedation

## 2011-04-15 MED ORDER — SODIUM CHLORIDE 0.9 % IV SOLN: 250.0000 mL | INTRAVENOUS | Status: DC

## 2011-04-15 MED ORDER — SODIUM CHLORIDE 0.45 % IV SOLN
INTRAVENOUS | Status: DC
  Administered 2011-04-15: 500 mL via INTRAVENOUS
  Administered 2011-04-15: 100 mL via INTRAVENOUS

## 2011-04-15 MED ORDER — BUTAMBEN-TETRACAINE-BENZOCAINE 2-2-14 % EX AERO
INHALATION_SPRAY | CUTANEOUS | Status: DC | PRN
  Administered 2011-04-15: 2 via TOPICAL

## 2011-04-15 MED ORDER — MIDAZOLAM HCL 10 MG/2ML IJ SOLN
INTRAMUSCULAR | Status: DC | PRN
  Administered 2011-04-15: 2 mg via INTRAVENOUS
  Administered 2011-04-15 (×2): 1 mg via INTRAVENOUS

## 2011-04-15 MED ORDER — FENTANYL CITRATE 0.05 MG/ML IJ SOLN
INTRAMUSCULAR | Status: DC | PRN
  Administered 2011-04-15 (×2): 25 ug via INTRAVENOUS

## 2011-04-15 MED ORDER — FENTANYL CITRATE 0.05 MG/ML IJ SOLN
INTRAMUSCULAR | Status: AC
  Filled 2011-04-15: qty 2

## 2011-04-15 MED ORDER — SODIUM CHLORIDE 0.9 % IJ SOLN: 3.0000 mL | INTRAMUSCULAR | Status: DC | PRN

## 2011-04-15 MED ORDER — MIDAZOLAM HCL 10 MG/2ML IJ SOLN
INTRAMUSCULAR | Status: AC
  Filled 2011-04-15: qty 2

## 2011-04-15 MED ORDER — BENZOCAINE 20 % MT SOLN
1.0000 "application " | OROMUCOSAL | Status: DC | PRN
  Filled 2011-04-15: qty 57

## 2011-04-15 MED ORDER — SODIUM CHLORIDE 0.9 % IJ SOLN: 3.0000 mL | Freq: Two times a day (BID) | INTRAMUSCULAR | Status: DC

## 2011-04-15 MED ORDER — SODIUM CHLORIDE 0.9 % IV SOLN: INTRAVENOUS | Status: DC

## 2011-04-15 MED ORDER — PROPOFOL 10 MG/ML IV BOLUS
INTRAVENOUS | Status: DC | PRN
  Administered 2011-04-15: 100 mg via INTRAVENOUS

## 2011-04-15 MED ORDER — HYDROCORTISONE 1 % EX CREA: 1.0000 "application " | TOPICAL_CREAM | Freq: Three times a day (TID) | CUTANEOUS | Status: DC | PRN

## 2011-04-15 NOTE — Transfer of Care (Signed)
Immediate Anesthesia Transfer of Care Note  Patient: [name]  Procedure(s) Performed: Procedure(s) (LRB): TRANSESOPHAGEAL ECHOCARDIOGRAM (TEE) (N/A) CARDIOVERSION (N/A)  Patient Location: Endoscopy Unit  Anesthesia Type: General  Level of Consciousness: awake, alert  and oriented  Airway & Oxygen Therapy: Patient Spontanous Breathing and Patient connected to nasal cannula oxygen  Post-op Assessment: Report given to PACU RN, Post -op Vital signs reviewed and stable and Patient moving all extremities X 4  Post vital signs: Reviewed and stable  Complications: No apparent anesthesia complications

## 2011-04-15 NOTE — Discharge Instructions (Signed)
Transesophageal Echocardiography A transesophageal echocardiogram (TEE) is a picture test of your heart using sound waves. The pictures taken can give very detailed pictures of your heart. This can help your doctor see if there are problems with your heart. A TEE can check:  If your heart has blood clots in it.   How well your heart valves are working.   If you have puffiness (swelling) on the inside lining of your heart.   Some of the major arteries of your heart.  TEST  A tube (catheter) is put down your esophagus.   A tube (IV) will be started in your hand or arm. The medicine will be given through the tube to keep you relaxed.   A numbing medicine will be sprayed in the back of your throat to help numb it.   The tip of the probe is placed into the back of your mouth and you will be asked to swallow. This helps to pass the probe into your esophagus.   Once the tip of the probe is in the right place, your doctor can take pictures of your heart.   You may feel pressure at the back of your throat.  AFTER THE TEST  You will be taken to a recovery room area so the sedation can wear off. You will go home when you are fully awake and able to swallow liquids.   Your throat may be sore and scratchy. This normally goes away in 24 hours.   Have someone take you home. Do not drive or use machinery for the rest of the day.  Finding out the results of your test Ask when your test results will be ready. Make sure you get your test results. Document Released: 12/13/2008 Document Revised: 10/28/2010 Document Reviewed: 12/13/2008 [facility] Patient Information 2012 [place], [place].

## 2011-04-15 NOTE — Preoperative (Signed)
Beta Blockers   Reason not to administer Beta Blockers:Not Applicable 

## 2011-04-15 NOTE — Anesthesia Postprocedure Evaluation (Signed)
  Anesthesia Post-op Note  Patient: [name]  Procedure(s) Performed: Procedure(s) (LRB): TRANSESOPHAGEAL ECHOCARDIOGRAM (TEE) (N/A) CARDIOVERSION (N/A)  Patient Location: Endoscopy Unit  Anesthesia Type: General  Level of Consciousness: awake and alert   Airway and Oxygen Therapy: Patient Spontanous Breathing and Patient connected to nasal cannula oxygen  Post-op Pain: none  Post-op Assessment: Post-op Vital signs reviewed, Patient's Cardiovascular Status Stable, Respiratory Function Stable, Patent Airway and No signs of Nausea or vomiting  Post-op Vital Signs: Reviewed and stable  Complications: No apparent anesthesia complications

## 2011-04-15 NOTE — Interval H&P Note (Signed)
History and Physical Interval Note:  04/15/2011 12:07 PM  [name]  has presented today for surgery, with the diagnosis of a-fib  The various methods of treatment have been discussed with the patient and family. After consideration of risks, benefits and other options for treatment, the patient has consented to  Procedure(s) (LRB): TRANSESOPHAGEAL ECHOCARDIOGRAM (TEE) (N/A) CARDIOVERSION (N/A) as a surgical intervention .  The patients' history has been reviewed, patient examined, no change in status, stable for surgery.  I have reviewed the patients' chart and labs.  Questions were answered to the patient's satisfaction.     [name] [employer]

## 2011-04-15 NOTE — Op Note (Signed)
Procedure: TEE-guided cardioversion  Indication: Atrial fibrillation  Procedure Note:  INR was 2.5 today.  After informed consent was obtained, patient was sedated with 4 mg IV Versed and 50 mcg IV Fentanyl.  TEE was then done to rule out atrial thrombus.  The LA appendage had been ligated with prior cardiac surgery.  There was no LA thrombus.  There was a bioprosthetic aortic valve that was functioning properly.  Please see TEE note in echo section of chart for full details.    After TEE, the patient was sedated by anesthesiology using IV Propofol.  DC cardioversion was carried out at 200 J with conversion to NSR with 1 shock.  There were no complications.    Guidant rep will be by to interrogate pacemaker post-procedure.

## 2011-04-15 NOTE — Anesthesia Preprocedure Evaluation (Signed)
Anesthesia Evaluation  Patient identified by MRN, date of birth, ID band Patient awake    Reviewed: Allergy & Precautions, H&P , NPO status , Patient's Chart, lab work & pertinent test results  Airway Mallampati: II      Dental  (+) Teeth Intact   Pulmonary          Cardiovascular + angina with exertion + CAD + dysrhythmias Atrial Fibrillation + Valvular Problems/Murmurs     Neuro/Psych CVA, No Residual Symptoms    GI/Hepatic   Endo/Other  Hypothyroidism Hyperthyroidism   Renal/GU      Musculoskeletal   Abdominal   Peds  Hematology   Anesthesia Other Findings   Reproductive/Obstetrics                           Anesthesia Physical Anesthesia Plan  ASA: IV  Anesthesia Plan: General   Post-op Pain Management:    Induction: Intravenous  Airway Management Planned: Mask  Additional Equipment:   Intra-op Plan:   Post-operative Plan:   Informed Consent: I have reviewed the patients History and Physical, chart, labs and discussed the procedure including the risks, benefits and alternatives for the proposed anesthesia with the patient or authorized representative who has indicated his/her understanding and acceptance.   Dental advisory given  Plan Discussed with:   Anesthesia Plan Comments:         Anesthesia Quick Evaluation

## 2011-04-15 NOTE — H&P (View-Only) (Signed)
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 He is concerned he has gone back out of rhythm. He is a 77 year old man with aortic stenosis who underwent aortic valve replacement several weeks ago. At that time he had a surgical maze procedure performed. The patient had done quite well until the evening prior to his clinic visit. He noted palpitations and a sense that his heart was racing. He is referred now for additional evaluation. Of note, the patient has had therapeutic INRs but today his INR was less than 2. Allergies  Allergen Reactions  . Contrast Media (Iodinated Diagnostic Agents) Hives     Current Outpatient Prescriptions  Medication Sig Dispense Refill  . amiodarone (PACERONE) 200 MG tablet Take 200 mg by mouth 2 (two) times daily.       Marland Kitchen aspirin 81 MG tablet Take 81 mg by mouth daily.       . bisacodyl (DULCOLAX) 5 MG EC tablet Take 5 mg by mouth daily as needed.      . Cholecalciferol (VITAMIN D PO) Take 2,000 Units by mouth daily.       Marland Kitchen co-enzyme Q-10 30 MG capsule Take 30 mg by mouth daily.       . ferrous gluconate (FERGON) 324 MG tablet Take 1 tablet (324 mg total) by mouth 2 (two) times daily with a meal.  60 tablet  1  . fish oil-omega-3 fatty acids 1000 MG capsule Take 1 g by mouth daily.      . hydrochlorothiazide (HYDRODIURIL) 25 MG tablet Take 0.5 tablets (12.5 mg total) by mouth daily.      Marland Kitchen HYDROcodone-acetaminophen (NORCO) 5-325 MG per tablet Take 1 tablet by mouth every 4 (four) hours as needed for pain. For pain  50 tablet  0  . levothyroxine (SYNTHROID, LEVOTHROID) 50 MCG tablet Take 50 mcg by mouth daily.        Marland Kitchen LORazepam (ATIVAN) 1 MG tablet Take 1 mg by mouth at bedtime as needed. For anxiety      . metoprolol (TOPROL-XL) 50 MG 24 hr tablet 50 mg. Take one-half tablet by mouth twice a day      . polyethylene glycol (MIRALAX / GLYCOLAX) packet Take 17 g by mouth as needed.      . potassium chloride (K-DUR,KLOR-CON) 10  MEQ tablet Take 1 tablet (10 mEq total) by mouth 2 (two) times daily.      . rosuvastatin (CRESTOR) 20 MG tablet Take 20 mg by mouth daily.        . Tamsulosin HCl (FLOMAX) 0.4 MG CAPS Take 1 capsule (0.4 mg total) by mouth at bedtime every other day  1 capsule    . warfarin (COUMADIN) 4 MG tablet Take 4 mg by mouth daily.          Past Medical History  Diagnosis Date  . Chronotropic incompetence with sinus node dysfunction     Status post Guidant dual-mode, dual-pacing, dual-sensing  pacemaker   implantation now programmed to AAI with recent generator change.  . Coronary artery disease     status post multiple prior percutaneous coronary interventions, microvascular angina per Dr [name]  . Aortic stenosis     moderate aortic stenosis  . Hyperlipidemia   . Hypercoagulable state     chronically anticoagulated with coumadin  . Heart murmur   . Stroke     1990  . Hyperthyroidism   . Benign prostatic hypertrophy   . Arthritis   . Paroxysmal atrial  fibrillation     DR. STuckey,   . Hypothyroidism     Dr. [name]    ROS:   All systems reviewed and negative except as noted in the HPI.   Past Surgical History  Procedure Date  . Pacemaker insertion 1991    Guidant PPM, most recent Generator Change by Dr [name] was 08/22/06  . Hemrrhoidectomy   . Cardiac catheterization     11  . Cardioversion   . Appendectomy   . Aortic valve replacement 03/15/2011    Procedure: AORTIC VALVE REPLACEMENT (AVR);  Surgeon: Alleen Borne, MD;  Location: [facility] OR;  Service: Open Heart Surgery;  Laterality: N/A;  . Maze 03/15/2011    Procedure: MAZE;  Surgeon: Alleen Borne, MD;  Location: [facility] OR;  Service: Open Heart Surgery;  Laterality: N/A;     Family History  Problem Relation Age of Onset  . Heart disease Brother     Twin brother has coronary disease and recent AVR for AS     History   Social History  . Marital Status: Married    Spouse Name: N/A    Number of Children: N/A  . Years  of Education: N/A   Occupational History  . Not on file.   Social History Main Topics  . Smoking status: Never Smoker   . Smokeless tobacco: Not on file  . Alcohol Use: No     occassional  . Drug Use: No  . Sexually Active: Not on file   Other Topics Concern  . Not on file   Social History Narrative  . No narrative on file     BP 92/62  Pulse 108  Physical Exam:  Well appearing 77 year old man, NAD HEENT: Unremarkable Neck:  No JVD, no thyromegally Lungs:  Clear with no wheezes, rales, or rhonchi. HEART:  IRegular tachycardic rhythm, no murmurs, no rubs, no clicks Abd:  soft, positive bowel sounds, no organomegally, no rebound, no guarding Ext:  2 plus pulses, no edema, no cyanosis, no clubbing Skin:  No rashes no nodules Neuro:  CN II through XII intact, motor grossly intact  EKG Atrial fib/flutter with a rapid ventricular response DEVICE  Normal device function.  See PaceArt for details. Atrial flutter/fib.   Assess/Plan:

## 2011-04-16 ENCOUNTER — Encounter: Payer: Medicare Other | Admitting: *Deleted

## 2011-04-19 ENCOUNTER — Encounter (HOSPITAL_COMMUNITY): Payer: Self-pay | Admitting: Cardiology

## 2011-04-19 ENCOUNTER — Encounter: Payer: Medicare Other | Admitting: *Deleted

## 2011-04-21 NOTE — Progress Notes (Signed)
Addended by: Judithe Modest D on: 04/21/2011 10:37 AM   Modules accepted: Orders

## 2011-04-23 ENCOUNTER — Ambulatory Visit (INDEPENDENT_AMBULATORY_CARE_PROVIDER_SITE_OTHER): Payer: Medicare Other | Admitting: Pharmacist

## 2011-04-23 DIAGNOSIS — I4891 Unspecified atrial fibrillation: Secondary | ICD-10-CM | POA: Diagnosis not present

## 2011-04-23 DIAGNOSIS — Z954 Presence of other heart-valve replacement: Secondary | ICD-10-CM

## 2011-04-23 DIAGNOSIS — Z7901 Long term (current) use of anticoagulants: Secondary | ICD-10-CM

## 2011-04-27 DIAGNOSIS — R5381 Other malaise: Secondary | ICD-10-CM | POA: Diagnosis not present

## 2011-04-27 DIAGNOSIS — M25519 Pain in unspecified shoulder: Secondary | ICD-10-CM | POA: Diagnosis not present

## 2011-04-27 DIAGNOSIS — M19049 Primary osteoarthritis, unspecified hand: Secondary | ICD-10-CM | POA: Diagnosis not present

## 2011-04-27 DIAGNOSIS — M542 Cervicalgia: Secondary | ICD-10-CM | POA: Diagnosis not present

## 2011-04-30 ENCOUNTER — Ambulatory Visit (INDEPENDENT_AMBULATORY_CARE_PROVIDER_SITE_OTHER): Payer: Medicare Other | Admitting: *Deleted

## 2011-04-30 ENCOUNTER — Telehealth: Payer: Self-pay

## 2011-04-30 DIAGNOSIS — I4891 Unspecified atrial fibrillation: Secondary | ICD-10-CM | POA: Diagnosis not present

## 2011-04-30 DIAGNOSIS — Z7901 Long term (current) use of anticoagulants: Secondary | ICD-10-CM | POA: Diagnosis not present

## 2011-04-30 DIAGNOSIS — Z954 Presence of other heart-valve replacement: Secondary | ICD-10-CM

## 2011-04-30 LAB — POCT INR: INR: 2.5

## 2011-04-30 MED ORDER — AMIODARONE HCL 200 MG PO TABS: 200.0000 mg | ORAL_TABLET | Freq: Two times a day (BID) | ORAL | Status: DC

## 2011-04-30 MED ORDER — HYDROCHLOROTHIAZIDE 12.5 MG PO TABS: 12.5000 mg | ORAL_TABLET | Freq: Two times a day (BID) | ORAL | Status: DC

## 2011-04-30 MED ORDER — WARFARIN SODIUM 2 MG PO TABS: 2.0000 mg | ORAL_TABLET | ORAL | Status: DC

## 2011-04-30 MED ORDER — METOPROLOL SUCCINATE ER 25 MG PO TB24: 25.0000 mg | ORAL_TABLET | Freq: Two times a day (BID) | ORAL | Status: DC

## 2011-04-30 MED ORDER — POTASSIUM CHLORIDE CRYS ER 10 MEQ PO TBCR: 10.0000 meq | EXTENDED_RELEASE_TABLET | Freq: Two times a day (BID) | ORAL | Status: DC

## 2011-04-30 NOTE — Telephone Encounter (Signed)
I spoke with Selena Batten and she would like prescriptions sent into [facility] for patient: 1. HCTZ 12.5mg  bid #60 2. Metoprolol Succinate 25mg  bid #60 3. Amiodarone 200mg  bid #60 4. Warfarin 2mg  as directed #60 5. Potassium Chloride two daily #60  Prescriptions sent electronically to pharmacy.

## 2011-05-04 ENCOUNTER — Other Ambulatory Visit: Payer: Self-pay | Admitting: Cardiology

## 2011-05-04 ENCOUNTER — Other Ambulatory Visit: Payer: Self-pay | Admitting: *Deleted

## 2011-05-04 MED ORDER — METOPROLOL SUCCINATE ER 25 MG PO TB24: 25.0000 mg | ORAL_TABLET | Freq: Two times a day (BID) | ORAL | Status: DC

## 2011-05-05 DIAGNOSIS — Z13820 Encounter for screening for osteoporosis: Secondary | ICD-10-CM | POA: Diagnosis not present

## 2011-05-06 ENCOUNTER — Encounter (HOSPITAL_COMMUNITY)
Admission: RE | Admit: 2011-05-06 | Discharge: 2011-05-06 | Disposition: A | Discharge status: Home / Self Care | Payer: Medicare Other | Source: Ambulatory Visit | Attending: Cardiology | Admitting: Cardiology

## 2011-05-06 ENCOUNTER — Encounter (HOSPITAL_COMMUNITY): Payer: Self-pay

## 2011-05-06 DIAGNOSIS — I495 Sick sinus syndrome: Secondary | ICD-10-CM | POA: Insufficient documentation

## 2011-05-06 DIAGNOSIS — I4891 Unspecified atrial fibrillation: Secondary | ICD-10-CM | POA: Insufficient documentation

## 2011-05-06 DIAGNOSIS — E0590 Thyrotoxicosis, unspecified without thyrotoxic crisis or storm: Secondary | ICD-10-CM | POA: Insufficient documentation

## 2011-05-06 DIAGNOSIS — Z9861 Coronary angioplasty status: Secondary | ICD-10-CM | POA: Insufficient documentation

## 2011-05-06 DIAGNOSIS — Z8679 Personal history of other diseases of the circulatory system: Secondary | ICD-10-CM | POA: Insufficient documentation

## 2011-05-06 DIAGNOSIS — Z5189 Encounter for other specified aftercare: Secondary | ICD-10-CM | POA: Insufficient documentation

## 2011-05-06 DIAGNOSIS — E785 Hyperlipidemia, unspecified: Secondary | ICD-10-CM | POA: Insufficient documentation

## 2011-05-06 DIAGNOSIS — E039 Hypothyroidism, unspecified: Secondary | ICD-10-CM | POA: Insufficient documentation

## 2011-05-06 DIAGNOSIS — I080 Rheumatic disorders of both mitral and aortic valves: Secondary | ICD-10-CM | POA: Insufficient documentation

## 2011-05-06 DIAGNOSIS — I10 Essential (primary) hypertension: Secondary | ICD-10-CM | POA: Insufficient documentation

## 2011-05-06 DIAGNOSIS — I2510 Atherosclerotic heart disease of native coronary artery without angina pectoris: Secondary | ICD-10-CM | POA: Insufficient documentation

## 2011-05-06 DIAGNOSIS — R011 Cardiac murmur, unspecified: Secondary | ICD-10-CM | POA: Insufficient documentation

## 2011-05-06 NOTE — Progress Notes (Signed)
Cardiac Rehab Medication Review by a Pharmacist  Does the patient  feel that his/her medications are working for him/her?  yes  Has the patient been experiencing any side effects to the medications prescribed?  no  Does the patient measure his/her own blood pressure or blood glucose at home?  yes   Does the patient have any problems obtaining medications due to transportation or finances?   no  Understanding of regimen: good Understanding of indications: excellent Potential of compliance: good   Pharmacist comments: Daughter helps to manage medications.   Bayard Beaver. Saul Fordyce, PharmD  05/06/2011 8:21 AM

## 2011-05-07 DIAGNOSIS — E038 Other specified hypothyroidism: Secondary | ICD-10-CM | POA: Diagnosis not present

## 2011-05-10 ENCOUNTER — Encounter (HOSPITAL_COMMUNITY)
Admission: RE | Admit: 2011-05-10 | Discharge: 2011-05-10 | Disposition: A | Discharge status: Home / Self Care | Payer: Medicare Other | Source: Ambulatory Visit | Attending: Cardiology | Admitting: Cardiology

## 2011-05-10 DIAGNOSIS — Z8679 Personal history of other diseases of the circulatory system: Secondary | ICD-10-CM | POA: Diagnosis not present

## 2011-05-10 DIAGNOSIS — I495 Sick sinus syndrome: Secondary | ICD-10-CM | POA: Diagnosis not present

## 2011-05-10 DIAGNOSIS — Z5189 Encounter for other specified aftercare: Secondary | ICD-10-CM | POA: Diagnosis not present

## 2011-05-10 DIAGNOSIS — Z9861 Coronary angioplasty status: Secondary | ICD-10-CM | POA: Diagnosis not present

## 2011-05-10 DIAGNOSIS — I4891 Unspecified atrial fibrillation: Secondary | ICD-10-CM | POA: Diagnosis not present

## 2011-05-10 DIAGNOSIS — I080 Rheumatic disorders of both mitral and aortic valves: Secondary | ICD-10-CM | POA: Diagnosis not present

## 2011-05-10 DIAGNOSIS — E0590 Thyrotoxicosis, unspecified without thyrotoxic crisis or storm: Secondary | ICD-10-CM | POA: Diagnosis not present

## 2011-05-10 DIAGNOSIS — E039 Hypothyroidism, unspecified: Secondary | ICD-10-CM | POA: Diagnosis not present

## 2011-05-10 DIAGNOSIS — E785 Hyperlipidemia, unspecified: Secondary | ICD-10-CM | POA: Diagnosis not present

## 2011-05-10 DIAGNOSIS — R011 Cardiac murmur, unspecified: Secondary | ICD-10-CM | POA: Diagnosis not present

## 2011-05-10 DIAGNOSIS — I10 Essential (primary) hypertension: Secondary | ICD-10-CM | POA: Diagnosis not present

## 2011-05-10 DIAGNOSIS — I2510 Atherosclerotic heart disease of native coronary artery without angina pectoris: Secondary | ICD-10-CM | POA: Diagnosis not present

## 2011-05-10 NOTE — Progress Notes (Signed)
Pt started cardiac rehab today.  Pt tolerated light exercise without difficulty. Monitor show ?JR rate 60 and regular.  Very difficult to discern P wave.  Pt lead placement checked.  Pt placed on zoll to determine if pacer spikes were present.  No pacer spikes seen on ekg.  Pt gain increased to 3 and p waves became visible.  Noted  PVC during exercise.  Will send ekg tracing to Dr. [name] for review.

## 2011-05-12 ENCOUNTER — Encounter (HOSPITAL_COMMUNITY)
Admission: RE | Admit: 2011-05-12 | Discharge: 2011-05-12 | Disposition: A | Discharge status: Home / Self Care | Payer: Medicare Other | Source: Ambulatory Visit | Attending: Cardiology | Admitting: Cardiology

## 2011-05-13 NOTE — Progress Notes (Signed)
[name] 77 y.o. male       Nutrition Screen                                                                    YES  NO Do you live in a nursing home?  X   Do you eat out more than 3 times/week?    X If yes, how many times per week do you eat out?  Do you have food allergies?   X If yes, what are you allergic to?  Have you gained or lost more than 10 lbs without trying?               X If yes, how much weight have you lost and over what time period?  lbs gained or lost over  weeks/month  Do you want to lose weight? "always"   X  If yes, what is a goal weight or amount of weight you would like to lose? 198 lb  Do you eat alone most of the time?   X   Do you eat less than 2 meals/day?  X If yes, how many meals do you eat?  Do you drink more than 3 alcohol drinks/day? "Are you kidding"  X If yes, how many drinks per day?  Are you having trouble with constipation? * "un poco"  X If yes, what are you doing to help relieve constipation? "Praying"  Do you have financial difficulties with buying food?*    X Wife buys food  Are you experiencing regular nausea/ vomiting?*     X   Do you have a poor appetite? *                                        X   Do you have trouble chewing/swallowing? *   X    Pt with diagnoses of:  x Stent/ PTCA x AVR/MVR/ICD      x Dyslipidemia  / HDL< 40 / LDL>70 / High TG      x %  Body fat >goal / Body Mass Index >25 x HTN / BP >120/80 x Pre-diabetes        Pt Risk Score   1       Diagnosis Risk Score  30       Total Risk Score   31                         High Risk               X Low Risk    HT: 73.25" Ht Readings from Last 1 Encounters:  05/06/11 6' 1.25" (1.861 m)    WT:   227.3 lb (103.3 kg) Wt Readings from Last 3 Encounters:  05/06/11 227 lb 11.8 oz (103.3 kg)  04/15/11 224 lb (101.606 kg)  04/15/11 224 lb (101.606 kg)     IBW 84.3 123%IBW BMI 29.8 31.7%body fat  Meds reviewed: Vitamin D, Co-enzyme Q10, Ferrous Gluconate, Fish oil,  Hydrochlorothiazide, Potassium Chloride, Coumadin Past Medical History  Diagnosis Date  . Chronotropic incompetence with sinus node dysfunction  Status post Guidant dual-mode, dual-pacing, dual-sensing  pacemaker   implantation now programmed to AAI with recent generator change.  . Coronary artery disease     status post multiple prior percutaneous coronary interventions, microvascular angina per Dr [name]  . Aortic stenosis     moderate aortic stenosis  . Hyperlipidemia   . Hypercoagulable state     chronically anticoagulated with coumadin  . Heart murmur   . Stroke     1990  . Hyperthyroidism   . Benign prostatic hypertrophy   . Arthritis   . Paroxysmal atrial fibrillation     DR. STuckey,   . Hypothyroidism     Dr. [name]       Activity level: Pt is moderately active  Wt goal: 203-215 lb ( 92.3-97.7 kg) Current tobacco use? No      Food/Drug Interaction? Yes If Y, which med(s)? Coumadin If yes, pt given Food/Drug Interaction handout? Yes Labs:  Lipid Panel     Component Value Date/Time   CHOL 239* 11/16/2010 1355   TRIG 90.0 11/16/2010 1355   HDL 99.40 11/16/2010 1355   CHOLHDL 2 11/16/2010 1355   VLDL 18.0 11/16/2010 1355   Lab Results  Component Value Date   HGBA1C 5.7* 03/11/2011   04/07/11 Glucose 100  LDL goal: < 100       MI, DM, Carotid or PVD and > 2:      HTN, family h/o, > 75 yo male Estimated Daily Nutrition Needs for: ? wt loss  1550-2050 Kcal , Total Fat 40-55gm, Saturated Fat 12-16 gm, Trans Fat 1.5-2.1 gm,  Sodium less than 1500 mg

## 2011-05-14 ENCOUNTER — Encounter (HOSPITAL_COMMUNITY)
Admission: RE | Admit: 2011-05-14 | Discharge: 2011-05-14 | Disposition: A | Discharge status: Home / Self Care | Payer: Medicare Other | Source: Ambulatory Visit | Attending: Cardiology | Admitting: Cardiology

## 2011-05-14 ENCOUNTER — Ambulatory Visit (INDEPENDENT_AMBULATORY_CARE_PROVIDER_SITE_OTHER): Payer: Medicare Other | Admitting: Pharmacist

## 2011-05-14 DIAGNOSIS — Z7901 Long term (current) use of anticoagulants: Secondary | ICD-10-CM | POA: Diagnosis not present

## 2011-05-14 DIAGNOSIS — I4891 Unspecified atrial fibrillation: Secondary | ICD-10-CM

## 2011-05-14 DIAGNOSIS — Z954 Presence of other heart-valve replacement: Secondary | ICD-10-CM | POA: Diagnosis not present

## 2011-05-17 ENCOUNTER — Encounter (HOSPITAL_COMMUNITY)
Admission: RE | Admit: 2011-05-17 | Discharge: 2011-05-17 | Disposition: A | Discharge status: Home / Self Care | Payer: Medicare Other | Source: Ambulatory Visit | Attending: Cardiology | Admitting: Cardiology

## 2011-05-17 NOTE — Progress Notes (Signed)
Reviewed home exercise with pt today.  Pt plans to walk with wife and use home gym for exercise.  Reviewed THR, pulse, RPE, sign and symptoms, NTG use, and when to call 911 or MD.  Pt voiced understanding. Fabio Pierce, MA, ACSM RCEP

## 2011-05-17 NOTE — Progress Notes (Signed)
Pt with low BP at cool down - 88/50. Pt asymptomatic with no complaints.   Pt with water with him, but did not drink any during exercise. Pt given gatorade to drink. Further assessment revealed that patient ate breakfast early today which consisted of one medium size banana.  Pt did not eat any snack prior to the start of exercise.  Instructed pt that he must eat a snack before coming to rehab to exercise.  Pt verbalized understanding.

## 2011-05-19 ENCOUNTER — Encounter (HOSPITAL_COMMUNITY)
Admission: RE | Admit: 2011-05-19 | Discharge: 2011-05-19 | Disposition: A | Discharge status: Home / Self Care | Payer: Medicare Other | Source: Ambulatory Visit | Attending: Cardiology | Admitting: Cardiology

## 2011-05-19 ENCOUNTER — Other Ambulatory Visit: Payer: Self-pay

## 2011-05-19 DIAGNOSIS — I4891 Unspecified atrial fibrillation: Secondary | ICD-10-CM

## 2011-05-19 NOTE — Progress Notes (Signed)
Post exercise blood pressure 88/56. Patient asymptomatic given H20. Repeat blood pressure 102/64. Lauren [facility] nurse called and notified of low blood pressures.  Dr [name] left cardiac without complaints. Will send exercise flow sheets for Dr Riley Kill to review via media manger.

## 2011-05-19 NOTE — Progress Notes (Signed)
[name] 77 y.o. male Nutrition Note Spoke with pt.  Nutrition Plan reviewed with pt. Pt aware of pre-diabetic status. Pt noncommittal re: action plan to lose wt. Pt dislikes most food high in vitamin K and is aware of foods high in vitamin K. Nutrition Diagnosis   Food-and nutrition-related knowledge deficit related to lack of exposure to information as related to diagnosis of: ? CVD ? Pre-DM (A1c 5.7)    Overweight related to excessive energy intake as evidenced by a BMI of 29.8 Nutrition RX/ Estimated Daily Nutrition Needs for: wt loss  1550-2050 Kcal, 40-55 gm fat, 12-16 gm sat fat, 1.5-2.1 gm trans-fat, <1500 mg sodium  Nutrition Intervention   Pt's individual nutrition plan including cholesterol goals reviewed with pt.   Benefits of adopting Therapeutic Lifestyle Changes discussed when Medficts reviewed.   Pt to attend the Portion Distortion class   Pt to attend the  ? Nutrition I class                          ? Nutrition II class   Pt given handouts for: ? wt loss ? Consistent vit K diet   Continue client-centered nutrition education by RD, as part of interdisciplinary care. Goal(s)   Pt to identify food quantities necessary to achieve: ? wt loss to a goal wt of 203-215 lb (92.3-97.7 kg) at graduation from cardiac rehab.  Monitor and Evaluate progress toward nutrition goal with team.

## 2011-05-21 ENCOUNTER — Encounter (HOSPITAL_COMMUNITY)
Admission: RE | Admit: 2011-05-21 | Discharge: 2011-05-21 | Disposition: A | Discharge status: Home / Self Care | Payer: Medicare Other | Source: Ambulatory Visit | Attending: Cardiology | Admitting: Cardiology

## 2011-05-21 ENCOUNTER — Other Ambulatory Visit (INDEPENDENT_AMBULATORY_CARE_PROVIDER_SITE_OTHER): Payer: Medicare Other

## 2011-05-21 DIAGNOSIS — I4891 Unspecified atrial fibrillation: Secondary | ICD-10-CM | POA: Diagnosis not present

## 2011-05-21 LAB — BASIC METABOLIC PANEL
CO2: 26 mEq/L (ref 19–32)
Calcium: 9.2 mg/dL (ref 8.4–10.5)
Creatinine, Ser: 1.2 mg/dL (ref 0.4–1.5)
GFR: 61.02 mL/min (ref >60.00)
Glucose, Bld: 104 mg/dL — ABNORMAL HIGH (ref 70–99)
Sodium: 139 mEq/L (ref 135–145)

## 2011-05-21 LAB — CBC WITH DIFFERENTIAL/PLATELET
Basophils Absolute: 0 10*3/uL (ref 0.0–0.1)
Eosinophils Absolute: 0.2 10*3/uL (ref 0.0–0.7)
Hemoglobin: 11.8 g/dL — ABNORMAL LOW (ref 13.0–17.0)
Lymphocytes Relative: 16.4 % (ref 12.0–46.0)
MCHC: 33.1 g/dL (ref 30.0–36.0)
Monocytes Relative: 6.6 % (ref 3.0–12.0)
Neutro Abs: 5.1 10*3/uL (ref 1.4–7.7)
Neutrophils Relative %: 73 % (ref 43.0–77.0)
RDW: 14.8 % — ABNORMAL HIGH (ref 11.5–14.6)

## 2011-05-21 LAB — TSH: TSH: 7.97 u[IU]/mL — ABNORMAL HIGH (ref 0.35–5.50)

## 2011-05-21 NOTE — Progress Notes (Signed)
Pt informed rehab staff of medication changes by Dr. Riley Kill.  Pt hydrochlorothiazide d/c'd and amiodarone decreased to 200mg  once a day.  Pt again did not eat any breakfast.  Pt advised that he would need to eat prior to continue participation in rehab prior to exercise class.  Pt given gatorade to drink but declined offered crackers and peanut butter.  Pt verbalized that he would eat each time he comes in to exercise.

## 2011-05-24 ENCOUNTER — Encounter (HOSPITAL_COMMUNITY)
Admission: RE | Admit: 2011-05-24 | Discharge: 2011-05-24 | Disposition: A | Discharge status: Home / Self Care | Payer: Medicare Other | Source: Ambulatory Visit | Attending: Cardiology | Admitting: Cardiology

## 2011-05-24 NOTE — Progress Notes (Signed)
Pt to see Dr. Riley Kill in the office on Tuesday 05/25/11.  Rehab report scanned in media manager for review by MD.  Improving BP since medication change.  Pt reports that he ate breakfast prior to coming to exercise today.

## 2011-05-25 ENCOUNTER — Encounter: Payer: Self-pay | Admitting: Cardiology

## 2011-05-25 ENCOUNTER — Ambulatory Visit (INDEPENDENT_AMBULATORY_CARE_PROVIDER_SITE_OTHER): Payer: Medicare Other | Admitting: Cardiology

## 2011-05-25 VITALS — BP 138/80 | HR 60 | Ht 74.0 in | Wt 229.0 lb

## 2011-05-25 DIAGNOSIS — E039 Hypothyroidism, unspecified: Secondary | ICD-10-CM

## 2011-05-25 DIAGNOSIS — Z954 Presence of other heart-valve replacement: Secondary | ICD-10-CM | POA: Diagnosis not present

## 2011-05-25 DIAGNOSIS — I951 Orthostatic hypotension: Secondary | ICD-10-CM | POA: Diagnosis not present

## 2011-05-25 DIAGNOSIS — D6859 Other primary thrombophilia: Secondary | ICD-10-CM

## 2011-05-25 DIAGNOSIS — I4891 Unspecified atrial fibrillation: Secondary | ICD-10-CM

## 2011-05-25 DIAGNOSIS — J90 Pleural effusion, not elsewhere classified: Secondary | ICD-10-CM

## 2011-05-25 NOTE — Patient Instructions (Signed)
Your physician recommends that you schedule a follow-up appointment in: 6 WEEKS (Pt will call back to schedule)  Your physician recommends that you continue on your current medications as directed. Please refer to the Current Medication list given to you today.

## 2011-05-25 NOTE — Assessment & Plan Note (Signed)
Angina dramatically improved.  Now in NSR.  BP was down but now up.  Will hold on diuretics for now, but may need.

## 2011-05-25 NOTE — Assessment & Plan Note (Signed)
On long term warfarin.

## 2011-05-25 NOTE — Assessment & Plan Note (Signed)
Improved

## 2011-05-25 NOTE — Assessment & Plan Note (Signed)
No clinical evidence on exam.

## 2011-05-25 NOTE — Assessment & Plan Note (Signed)
Last TSH 7.97.  At some point will shoot for euthyroid status.

## 2011-05-25 NOTE — Assessment & Plan Note (Signed)
Is now on amiodarone at reduced dose.  SP MAZE procedure.  Had post op with cardioversion.  Will plan to gradually reduce and stop.

## 2011-05-25 NOTE — Progress Notes (Signed)
HPI:  [name] is in for follow up.  His BP has been a bit lower, so we pulled back on his HCTZ, and he has eaten more salt with the passover.  He denies any angina at this point, and he is doing pretty well.  He is able to walk fairly well, and feels none of what he had before.  No new symptoms.  We reviewed his meds today.    Current Outpatient Prescriptions  Medication Sig Dispense Refill  . amiodarone (PACERONE) 200 MG tablet Take 200 mg by mouth daily.      Marland Kitchen aspirin 81 MG tablet Take 81 mg by mouth daily.       . bisacodyl (DULCOLAX) 5 MG EC tablet Take 5 mg by mouth daily as needed.      . Cholecalciferol (VITAMIN D PO) Take 2,000 Units by mouth daily.       Marland Kitchen co-enzyme Q-10 30 MG capsule Take 30 mg by mouth daily.       . ferrous gluconate (FERGON) 324 MG tablet Take 324 mg by mouth daily with breakfast.      . fish oil-omega-3 fatty acids 1000 MG capsule Take 1 g by mouth as needed.       Marland Kitchen HYDROcodone-acetaminophen (NORCO) 5-325 MG per tablet Take 1 tablet by mouth every 4 (four) hours as needed for pain. For pain  50 tablet  0  . levothyroxine (SYNTHROID, LEVOTHROID) 50 MCG tablet Take 50 mcg by mouth daily.        Marland Kitchen LORazepam (ATIVAN) 1 MG tablet Take 1 mg by mouth at bedtime. For anxiety      . metoprolol succinate (TOPROL-XL) 50 MG 24 hr tablet Take 25 mg by mouth 2 (two) times daily. Take with or immediately following a meal.      . polyethylene glycol (MIRALAX / GLYCOLAX) packet Take 17 g by mouth as needed.      . potassium chloride (K-DUR,KLOR-CON) 10 MEQ tablet Take 1 tablet (10 mEq total) by mouth 2 (two) times daily.  60 tablet  6  . rosuvastatin (CRESTOR) 20 MG tablet Take 20 mg by mouth daily.        . Tamsulosin HCl (FLOMAX) 0.4 MG CAPS Take 1 capsule (0.4 mg total) by mouth at bedtime every other day  1 capsule    . warfarin (COUMADIN) 2 MG tablet Take 1 tablet (2 mg total) by mouth as directed.  60 tablet  6    Allergies  Allergen Reactions  . Contrast Media  (Iodinated Diagnostic Agents) Hives    Past Medical History  Diagnosis Date  . Chronotropic incompetence with sinus node dysfunction     Status post Guidant dual-mode, dual-pacing, dual-sensing  pacemaker   implantation now programmed to AAI with recent generator change.  . Coronary artery disease     status post multiple prior percutaneous coronary interventions, microvascular angina per Dr [name]  . Aortic stenosis     moderate aortic stenosis  . Hyperlipidemia   . Hypercoagulable state     chronically anticoagulated with coumadin  . Heart murmur   . Stroke     1990  . Hyperthyroidism   . Benign prostatic hypertrophy   . Arthritis   . Paroxysmal atrial fibrillation     DR. Eon Zunker,   . Hypothyroidism     Dr. [name]    Past Surgical History  Procedure Date  . Pacemaker insertion 1991    Guidant PPM, most recent Generator Change by  Dr [name] was 08/22/06  . Hemrrhoidectomy   . Cardiac catheterization     11  . Cardioversion   . Appendectomy   . Aortic valve replacement 03/15/2011    Procedure: AORTIC VALVE REPLACEMENT (AVR);  Surgeon: Alleen Borne, MD;  Location: [facility] OR;  Service: Open Heart Surgery;  Laterality: N/A;  . Maze 03/15/2011    Procedure: MAZE;  Surgeon: Alleen Borne, MD;  Location: [facility] OR;  Service: Open Heart Surgery;  Laterality: N/A;  . Tee without cardioversion 04/15/2011    Procedure: TRANSESOPHAGEAL ECHOCARDIOGRAM (TEE);  Surgeon: Marca Ancona, MD;  Location: [facility] ENDOSCOPY;  Service: Cardiovascular;  Laterality: N/A;  . Cardioversion 04/15/2011    Procedure: CARDIOVERSION;  Surgeon: Marca Ancona, MD;  Location: [facility] ENDOSCOPY;  Service: Cardiovascular;  Laterality: N/A;    Family History  Problem Relation Age of Onset  . Heart disease Brother     Twin brother has coronary disease and recent AVR for AS  . Anesthesia problems Neg Hx   . Hypotension Neg Hx   . Malignant hyperthermia Neg Hx   . Pseudochol deficiency Neg Hx     History   Social  History  . Marital Status: Married    Spouse Name: N/A    Number of Children: N/A  . Years of Education: N/A   Occupational History  . Not on file.   Social History Main Topics  . Smoking status: Never Smoker   . Smokeless tobacco: Not on file  . Alcohol Use: No     occassional  . Drug Use: No  . Sexually Active: Not on file   Other Topics Concern  . Not on file   Social History Narrative  . No narrative on file    ROS: Please see the HPI.  All other systems reviewed and negative.  PHYSICAL EXAM:  BP 138/80  Pulse 60  Ht 6\' 2"  (1.88 m)  Wt 229 lb (103.874 kg)  BMI 29.40 kg/m2  General: Well developed, well nourished, in no acute distress. Head:  Normocephalic and atraumatic. Neck: no JVD Lungs: Clear to auscultation and percussion. Heart: Normal S1 and S2.  Minimal SEM.   Pulses: Pulses normal in all 4 extremities. Extremities: No clubbing or cyanosis. Trace edema. Neurologic: Alert and oriented x 3.  EKG:  Electronic atrial pacemaker.  RBBB.  No acute changes.    ASSESSMENT AND PLAN:

## 2011-05-26 ENCOUNTER — Encounter (HOSPITAL_COMMUNITY)
Admission: RE | Admit: 2011-05-26 | Discharge: 2011-05-26 | Disposition: A | Discharge status: Home / Self Care | Payer: Medicare Other | Source: Ambulatory Visit | Attending: Cardiology | Admitting: Cardiology

## 2011-05-26 NOTE — Progress Notes (Signed)
Pt seen in office by Dr. Riley Kill.  No new changes.  Continue to monitor.

## 2011-05-28 ENCOUNTER — Encounter (HOSPITAL_COMMUNITY)
Admission: RE | Admit: 2011-05-28 | Discharge: 2011-05-28 | Disposition: A | Discharge status: Home / Self Care | Payer: Medicare Other | Source: Ambulatory Visit | Attending: Cardiology | Admitting: Cardiology

## 2011-05-31 ENCOUNTER — Encounter (HOSPITAL_COMMUNITY)
Admission: RE | Admit: 2011-05-31 | Discharge: 2011-05-31 | Disposition: A | Discharge status: Home / Self Care | Payer: Medicare Other | Source: Ambulatory Visit | Attending: Cardiology | Admitting: Cardiology

## 2011-05-31 DIAGNOSIS — I2510 Atherosclerotic heart disease of native coronary artery without angina pectoris: Secondary | ICD-10-CM | POA: Insufficient documentation

## 2011-05-31 DIAGNOSIS — I10 Essential (primary) hypertension: Secondary | ICD-10-CM | POA: Insufficient documentation

## 2011-05-31 DIAGNOSIS — Z8679 Personal history of other diseases of the circulatory system: Secondary | ICD-10-CM | POA: Diagnosis not present

## 2011-05-31 DIAGNOSIS — E039 Hypothyroidism, unspecified: Secondary | ICD-10-CM | POA: Diagnosis not present

## 2011-05-31 DIAGNOSIS — I080 Rheumatic disorders of both mitral and aortic valves: Secondary | ICD-10-CM | POA: Insufficient documentation

## 2011-05-31 DIAGNOSIS — E785 Hyperlipidemia, unspecified: Secondary | ICD-10-CM | POA: Insufficient documentation

## 2011-05-31 DIAGNOSIS — E0590 Thyrotoxicosis, unspecified without thyrotoxic crisis or storm: Secondary | ICD-10-CM | POA: Diagnosis not present

## 2011-05-31 DIAGNOSIS — I495 Sick sinus syndrome: Secondary | ICD-10-CM | POA: Insufficient documentation

## 2011-05-31 DIAGNOSIS — Z5189 Encounter for other specified aftercare: Secondary | ICD-10-CM | POA: Insufficient documentation

## 2011-05-31 DIAGNOSIS — R011 Cardiac murmur, unspecified: Secondary | ICD-10-CM | POA: Insufficient documentation

## 2011-05-31 DIAGNOSIS — Z9861 Coronary angioplasty status: Secondary | ICD-10-CM | POA: Insufficient documentation

## 2011-05-31 DIAGNOSIS — I4891 Unspecified atrial fibrillation: Secondary | ICD-10-CM | POA: Insufficient documentation

## 2011-06-02 ENCOUNTER — Encounter (HOSPITAL_COMMUNITY)
Admission: RE | Admit: 2011-06-02 | Discharge: 2011-06-02 | Disposition: A | Discharge status: Home / Self Care | Payer: Medicare Other | Source: Ambulatory Visit | Attending: Cardiology | Admitting: Cardiology

## 2011-06-04 ENCOUNTER — Encounter (HOSPITAL_COMMUNITY): Payer: Medicare Other

## 2011-06-04 ENCOUNTER — Encounter (HOSPITAL_COMMUNITY)
Admission: RE | Admit: 2011-06-04 | Discharge: 2011-06-04 | Disposition: A | Discharge status: Home / Self Care | Payer: Medicare Other | Source: Ambulatory Visit

## 2011-06-07 ENCOUNTER — Encounter (HOSPITAL_COMMUNITY)
Admission: RE | Admit: 2011-06-07 | Discharge: 2011-06-07 | Disposition: A | Discharge status: Home / Self Care | Payer: Medicare Other | Source: Ambulatory Visit | Attending: Cardiology | Admitting: Cardiology

## 2011-06-09 ENCOUNTER — Encounter (HOSPITAL_COMMUNITY)
Admission: RE | Admit: 2011-06-09 | Discharge: 2011-06-09 | Disposition: A | Discharge status: Home / Self Care | Payer: Medicare Other | Source: Ambulatory Visit | Attending: Cardiology | Admitting: Cardiology

## 2011-06-11 ENCOUNTER — Encounter (HOSPITAL_COMMUNITY)
Admission: RE | Admit: 2011-06-11 | Discharge: 2011-06-11 | Disposition: A | Discharge status: Home / Self Care | Payer: Medicare Other | Source: Ambulatory Visit | Attending: Cardiology | Admitting: Cardiology

## 2011-06-14 ENCOUNTER — Encounter (HOSPITAL_COMMUNITY): Payer: Medicare Other

## 2011-06-15 ENCOUNTER — Ambulatory Visit: Payer: Medicare Other | Admitting: Surgery

## 2011-06-15 ENCOUNTER — Ambulatory Visit (INDEPENDENT_AMBULATORY_CARE_PROVIDER_SITE_OTHER): Payer: Medicare Other | Admitting: Surgery

## 2011-06-15 ENCOUNTER — Encounter: Payer: Self-pay | Admitting: Surgery

## 2011-06-15 VITALS — BP 127/73 | HR 60 | Resp 20 | Ht 74.0 in | Wt 226.0 lb

## 2011-06-15 DIAGNOSIS — Z954 Presence of other heart-valve replacement: Secondary | ICD-10-CM

## 2011-06-15 DIAGNOSIS — I359 Nonrheumatic aortic valve disorder, unspecified: Secondary | ICD-10-CM

## 2011-06-15 DIAGNOSIS — Z952 Presence of prosthetic heart valve: Secondary | ICD-10-CM

## 2011-06-15 NOTE — Progress Notes (Signed)
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 Since I last saw him he said he has continued to improve and is now walking without any of his previous anginal symptoms. There only time he develops shortness of breath is when going up stairs quickly. He still has some problems with postural hypotension and is learning to deal with that. He did have some problems with atrial arrhythmias after discharge and required cardioversion. He says he has not noticed any tachycardia recently and only occasional PVCs. His amiodarone has been decreased to once daily and he said Dr. Riley Kill is considering stopping the amiodarone and possibly the Coumadin.   Current Outpatient Prescriptions  Medication Sig Dispense Refill  . amiodarone (PACERONE) 200 MG tablet Take 200 mg by mouth daily.      Marland Kitchen aspirin 81 MG tablet Take 81 mg by mouth daily.       . bisacodyl (DULCOLAX) 5 MG EC tablet Take 5 mg by mouth daily as needed.      . Cholecalciferol (VITAMIN D PO) Take 2,000 Units by mouth daily.       Marland Kitchen co-enzyme Q-10 30 MG capsule Take 30 mg by mouth daily.       . ferrous gluconate (FERGON) 324 MG tablet Take 324 mg by mouth every 3 (three) days.       . fish oil-omega-3 fatty acids 1000 MG capsule Take 1 g by mouth as needed.       Marland Kitchen HYDROcodone-acetaminophen (NORCO) 5-325 MG per tablet Take 1 tablet by mouth every 4 (four) hours as needed for pain. For pain  50 tablet  0  . levothyroxine (SYNTHROID, LEVOTHROID) 50 MCG tablet Take 50 mcg by mouth daily.        Marland Kitchen LORazepam (ATIVAN) 1 MG tablet Take 1 mg by mouth at bedtime. For anxiety      . metoprolol succinate (TOPROL-XL) 50 MG 24 hr tablet Take 25 mg by mouth daily. Take with or immediately following a meal.      . polyethylene glycol (MIRALAX /  GLYCOLAX) packet Take 17 g by mouth as needed.      . potassium chloride (K-DUR,KLOR-CON) 10 MEQ tablet Take 1 tablet (10 mEq total) by mouth 2 (two) times daily.  60 tablet  6  . rosuvastatin (CRESTOR) 20 MG tablet Take 20 mg by mouth daily.        . Tamsulosin HCl (FLOMAX) 0.4 MG CAPS Take 1 capsule (0.4 mg total) by mouth at bedtime every other day  1 capsule    . warfarin (COUMADIN) 2 MG tablet Take 1 tablet (2 mg total) by mouth as directed.  60 tablet  6  . DISCONTD: hydrochlorothiazide (HYDRODIURIL) 12.5 MG tablet Take 12.5 mg by mouth daily.        Physical Exam: BP 127/73  Pulse 60  Resp 20  Ht 6\' 2"  (1.88 m)  Wt 226 lb (102.513 kg)  BMI 29.02 kg/m2  SpO2 94% He looks well. Lung exam is clear. Cardiac exam shows a regular rate and rhythm with normal prosthetic valve sounds. There is no murmur. Chest incision is healing well and the sternum is stable. There is no peripheral edema.   Impression:  Overall he appears to be making good progress following surgery. I told him he can return to normal activity without limitation.  Plan:  He will continue to followup with Dr. Riley Kill and will contact me if he develops any problems with his incisions.

## 2011-06-16 ENCOUNTER — Encounter (HOSPITAL_COMMUNITY)
Admission: RE | Admit: 2011-06-16 | Discharge: 2011-06-16 | Disposition: A | Discharge status: Home / Self Care | Payer: Medicare Other | Source: Ambulatory Visit | Attending: Cardiology | Admitting: Cardiology

## 2011-06-18 ENCOUNTER — Encounter (HOSPITAL_COMMUNITY): Payer: Medicare Other

## 2011-06-21 ENCOUNTER — Encounter (HOSPITAL_COMMUNITY): Payer: Medicare Other

## 2011-06-23 ENCOUNTER — Encounter (HOSPITAL_COMMUNITY)
Admission: RE | Admit: 2011-06-23 | Discharge: 2011-06-23 | Disposition: A | Discharge status: Home / Self Care | Payer: Medicare Other | Source: Ambulatory Visit | Attending: Cardiology | Admitting: Cardiology

## 2011-06-23 ENCOUNTER — Ambulatory Visit: Payer: Medicare Other

## 2011-06-23 ENCOUNTER — Ambulatory Visit (INDEPENDENT_AMBULATORY_CARE_PROVIDER_SITE_OTHER): Payer: Medicare Other | Admitting: Cardiology

## 2011-06-23 ENCOUNTER — Encounter: Payer: Self-pay | Admitting: Cardiology

## 2011-06-23 VITALS — BP 132/82 | HR 60 | Ht 74.0 in | Wt 225.0 lb

## 2011-06-23 DIAGNOSIS — I4891 Unspecified atrial fibrillation: Secondary | ICD-10-CM | POA: Diagnosis not present

## 2011-06-23 DIAGNOSIS — Z954 Presence of other heart-valve replacement: Secondary | ICD-10-CM

## 2011-06-23 DIAGNOSIS — I209 Angina pectoris, unspecified: Secondary | ICD-10-CM

## 2011-06-23 DIAGNOSIS — I495 Sick sinus syndrome: Secondary | ICD-10-CM

## 2011-06-23 DIAGNOSIS — I359 Nonrheumatic aortic valve disorder, unspecified: Secondary | ICD-10-CM

## 2011-06-23 DIAGNOSIS — E0590 Thyrotoxicosis, unspecified without thyrotoxic crisis or storm: Secondary | ICD-10-CM

## 2011-06-23 DIAGNOSIS — I951 Orthostatic hypotension: Secondary | ICD-10-CM

## 2011-06-23 DIAGNOSIS — I4892 Unspecified atrial flutter: Secondary | ICD-10-CM

## 2011-06-23 DIAGNOSIS — I2510 Atherosclerotic heart disease of native coronary artery without angina pectoris: Secondary | ICD-10-CM | POA: Diagnosis not present

## 2011-06-23 DIAGNOSIS — E785 Hyperlipidemia, unspecified: Secondary | ICD-10-CM | POA: Diagnosis not present

## 2011-06-23 DIAGNOSIS — E039 Hypothyroidism, unspecified: Secondary | ICD-10-CM

## 2011-06-23 DIAGNOSIS — D6859 Other primary thrombophilia: Secondary | ICD-10-CM

## 2011-06-23 LAB — T4, FREE: Free T4: 0.82 ng/dL (ref 0.60–1.60)

## 2011-06-23 LAB — HEPATIC FUNCTION PANEL
Bilirubin, Direct: 0.1 mg/dL (ref 0.0–0.3)
Total Bilirubin: 0.2 mg/dL — ABNORMAL LOW (ref 0.3–1.2)

## 2011-06-23 LAB — BASIC METABOLIC PANEL
CO2: 30 mEq/L (ref 19–32)
Chloride: 105 mEq/L (ref 96–112)
Glucose, Bld: 104 mg/dL — ABNORMAL HIGH (ref 70–99)
Potassium: 4.4 mEq/L (ref 3.5–5.1)
Sodium: 142 mEq/L (ref 135–145)

## 2011-06-23 LAB — CBC WITH DIFFERENTIAL/PLATELET
Basophils Absolute: 0 10*3/uL (ref 0.0–0.1)
Basophils Relative: 1 % (ref 0.0–3.0)
Eosinophils Absolute: 0.2 10*3/uL (ref 0.0–0.7)
HCT: 36.1 % — ABNORMAL LOW (ref 39.0–52.0)
Hemoglobin: 11.9 g/dL — ABNORMAL LOW (ref 13.0–17.0)
Lymphs Abs: 0.9 10*3/uL (ref 0.7–4.0)
MCHC: 32.8 g/dL (ref 30.0–36.0)
Monocytes Relative: 7.3 % (ref 3.0–12.0)
Neutro Abs: 3.3 10*3/uL (ref 1.4–7.7)
RBC: 4.19 Mil/uL — ABNORMAL LOW (ref 4.22–5.81)
RDW: 15.6 % — ABNORMAL HIGH (ref 11.5–14.6)

## 2011-06-23 LAB — LIPID PANEL
Cholesterol: 156 mg/dL (ref 0–200)
Triglycerides: 67 mg/dL (ref 0.0–149.0)

## 2011-06-23 LAB — TSH: TSH: 6.56 u[IU]/mL — ABNORMAL HIGH (ref 0.35–5.50)

## 2011-06-23 LAB — PROTIME-INR: INR: 2 ratio — ABNORMAL HIGH (ref 0.8–1.0)

## 2011-06-25 ENCOUNTER — Encounter (HOSPITAL_COMMUNITY)
Admission: RE | Admit: 2011-06-25 | Discharge: 2011-06-25 | Disposition: A | Discharge status: Home / Self Care | Payer: Medicare Other | Source: Ambulatory Visit | Attending: Cardiology | Admitting: Cardiology

## 2011-06-28 ENCOUNTER — Encounter (HOSPITAL_COMMUNITY)
Admission: RE | Admit: 2011-06-28 | Discharge: 2011-06-28 | Disposition: A | Discharge status: Home / Self Care | Payer: Medicare Other | Source: Ambulatory Visit | Attending: Cardiology | Admitting: Cardiology

## 2011-06-30 ENCOUNTER — Encounter (HOSPITAL_COMMUNITY)
Admission: RE | Admit: 2011-06-30 | Discharge: 2011-06-30 | Disposition: A | Discharge status: Home / Self Care | Payer: Medicare Other | Source: Ambulatory Visit | Attending: Cardiology | Admitting: Cardiology

## 2011-06-30 DIAGNOSIS — I2510 Atherosclerotic heart disease of native coronary artery without angina pectoris: Secondary | ICD-10-CM | POA: Diagnosis not present

## 2011-06-30 DIAGNOSIS — E039 Hypothyroidism, unspecified: Secondary | ICD-10-CM | POA: Diagnosis not present

## 2011-06-30 DIAGNOSIS — I4891 Unspecified atrial fibrillation: Secondary | ICD-10-CM | POA: Diagnosis not present

## 2011-06-30 DIAGNOSIS — I495 Sick sinus syndrome: Secondary | ICD-10-CM | POA: Insufficient documentation

## 2011-06-30 DIAGNOSIS — R011 Cardiac murmur, unspecified: Secondary | ICD-10-CM | POA: Diagnosis not present

## 2011-06-30 DIAGNOSIS — E785 Hyperlipidemia, unspecified: Secondary | ICD-10-CM | POA: Diagnosis not present

## 2011-06-30 DIAGNOSIS — I10 Essential (primary) hypertension: Secondary | ICD-10-CM | POA: Insufficient documentation

## 2011-06-30 DIAGNOSIS — I080 Rheumatic disorders of both mitral and aortic valves: Secondary | ICD-10-CM | POA: Insufficient documentation

## 2011-06-30 DIAGNOSIS — Z9861 Coronary angioplasty status: Secondary | ICD-10-CM | POA: Insufficient documentation

## 2011-06-30 DIAGNOSIS — E0590 Thyrotoxicosis, unspecified without thyrotoxic crisis or storm: Secondary | ICD-10-CM | POA: Diagnosis not present

## 2011-06-30 DIAGNOSIS — Z8679 Personal history of other diseases of the circulatory system: Secondary | ICD-10-CM | POA: Insufficient documentation

## 2011-06-30 DIAGNOSIS — Z5189 Encounter for other specified aftercare: Secondary | ICD-10-CM | POA: Insufficient documentation

## 2011-07-02 ENCOUNTER — Encounter (HOSPITAL_COMMUNITY)
Admission: RE | Admit: 2011-07-02 | Discharge: 2011-07-02 | Disposition: A | Discharge status: Home / Self Care | Payer: Medicare Other | Source: Ambulatory Visit | Attending: Cardiology | Admitting: Cardiology

## 2011-07-05 ENCOUNTER — Encounter (HOSPITAL_COMMUNITY): Payer: Medicare Other

## 2011-07-07 ENCOUNTER — Encounter (HOSPITAL_COMMUNITY)
Admission: RE | Admit: 2011-07-07 | Discharge: 2011-07-07 | Disposition: A | Discharge status: Home / Self Care | Payer: Medicare Other | Source: Ambulatory Visit | Attending: Cardiology | Admitting: Cardiology

## 2011-07-09 ENCOUNTER — Encounter (HOSPITAL_COMMUNITY): Payer: Medicare Other

## 2011-07-12 ENCOUNTER — Encounter (HOSPITAL_COMMUNITY): Payer: Medicare Other

## 2011-07-14 ENCOUNTER — Encounter (HOSPITAL_COMMUNITY): Payer: Medicare Other

## 2011-07-15 NOTE — Assessment & Plan Note (Signed)
Good time to check lipid and liver.

## 2011-07-15 NOTE — Assessment & Plan Note (Signed)
Long term goal is to eliminate the use of Pacerone.  It may not be possible, but that would be the goal. Has had a MAZE, and he will remain on long term anticoagulation with his history of familial clotting  ([name] has had a PE.  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

## 2011-07-15 NOTE — Assessment & Plan Note (Signed)
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

## 2011-07-15 NOTE — Assessment & Plan Note (Addendum)
Difficult to assess.  No current angina.  Has combo of CAD and syndrome X.  Angina has been relieved since he had his AVR. Goal of long term prevention reviewed with Gene.  Main goal would be to get him euthyroid, off Amiodarone, at goal and target for LDL, and maintain better body weight.

## 2011-07-15 NOTE — Progress Notes (Signed)
HPI:  Gene returns in follow up.  Continues to improve.  BPs at time can get a little low, but overall doing pretty well.  No major chest pain or other symptoms.  Does not seem to have problems since he had his cardioversion.   Current Outpatient Prescriptions  Medication Sig Dispense Refill  . amiodarone (PACERONE) 200 MG tablet Take 200 mg by mouth daily.      Marland Kitchen aspirin 81 MG tablet Take 81 mg by mouth daily.       . bisacodyl (DULCOLAX) 5 MG EC tablet Take 5 mg by mouth daily as needed.      . Cholecalciferol (VITAMIN D PO) Take 2,000 Units by mouth daily.       Marland Kitchen co-enzyme Q-10 30 MG capsule Take 300 mg by mouth daily. Every other day      . ferrous gluconate (FERGON) 324 MG tablet Take 324 mg by mouth every 3 (three) days.       . fish oil-omega-3 fatty acids 1000 MG capsule Take 1 g by mouth as needed.       Marland Kitchen HYDROcodone-acetaminophen (NORCO) 5-325 MG per tablet Take 1 tablet by mouth every 4 (four) hours as needed for pain. For pain  50 tablet  0  . levothyroxine (SYNTHROID, LEVOTHROID) 50 MCG tablet Take 50 mcg by mouth daily.        Marland Kitchen LORazepam (ATIVAN) 1 MG tablet Take 1 mg by mouth at bedtime. For anxiety      . metoprolol succinate (TOPROL-XL) 50 MG 24 hr tablet Take 12.5 mg by mouth daily. Take with or immediately following a meal.      . polyethylene glycol (MIRALAX / GLYCOLAX) packet Take 17 g by mouth as needed.      . potassium chloride (K-DUR,KLOR-CON) 10 MEQ tablet Take 1 tablet (10 mEq total) by mouth 2 (two) times daily.  60 tablet  6  . rosuvastatin (CRESTOR) 20 MG tablet Take 20 mg by mouth daily.        . Tamsulosin HCl (FLOMAX) 0.4 MG CAPS Take 1 capsule (0.4 mg total) by mouth at bedtime every other day  1 capsule    . warfarin (COUMADIN) 2 MG tablet Take 1 tablet (2 mg total) by mouth as directed.  60 tablet  6  . DISCONTD: hydrochlorothiazide (HYDRODIURIL) 12.5 MG tablet Take 12.5 mg by mouth daily.        Allergies  Allergen Reactions  . Contrast Media  (Iodinated Diagnostic Agents) Hives    Past Medical History  Diagnosis Date  . Chronotropic incompetence with sinus node dysfunction     Status post Guidant dual-mode, dual-pacing, dual-sensing  pacemaker   implantation now programmed to AAI with recent generator change.  . Coronary artery disease     status post multiple prior percutaneous coronary interventions, microvascular angina per Dr [name]  . Aortic stenosis     moderate aortic stenosis  . Hyperlipidemia   . Hypercoagulable state     chronically anticoagulated with coumadin  . Heart murmur   . Stroke     1990  . Hyperthyroidism   . Benign prostatic hypertrophy   . Arthritis   . Paroxysmal atrial fibrillation     DR. Jadarious Dobbins,   . Hypothyroidism     Dr. [name]    Past Surgical History  Procedure Date  . Pacemaker insertion 1991    Guidant PPM, most recent Generator Change by Dr [name] was 08/22/06  . Hemrrhoidectomy   .  Cardiac catheterization     11  . Cardioversion   . Appendectomy   . Aortic valve replacement 03/15/2011    Procedure: AORTIC VALVE REPLACEMENT (AVR);  Surgeon: Alleen Borne, MD;  Location: [facility] OR;  Service: Open Heart Surgery;  Laterality: N/A;  . Maze 03/15/2011    Procedure: MAZE;  Surgeon: Alleen Borne, MD;  Location: [facility] OR;  Service: Open Heart Surgery;  Laterality: N/A;  . Tee without cardioversion 04/15/2011    Procedure: TRANSESOPHAGEAL ECHOCARDIOGRAM (TEE);  Surgeon: Marca Ancona, MD;  Location: [facility] ENDOSCOPY;  Service: Cardiovascular;  Laterality: N/A;  . Cardioversion 04/15/2011    Procedure: CARDIOVERSION;  Surgeon: Marca Ancona, MD;  Location: [facility] ENDOSCOPY;  Service: Cardiovascular;  Laterality: N/A;    Family History  Problem Relation Age of Onset  . Heart disease Brother     Twin brother has coronary disease and recent AVR for AS  . Anesthesia problems Neg Hx   . Hypotension Neg Hx   . Malignant hyperthermia Neg Hx   . Pseudochol deficiency Neg Hx     History   Social  History  . Marital Status: Married    Spouse Name: N/A    Number of Children: N/A  . Years of Education: N/A   Occupational History  . Not on file.   Social History Main Topics  . Smoking status: Never Smoker   . Smokeless tobacco: Not on file  . Alcohol Use: No     occassional  . Drug Use: No  . Sexually Active: Not on file   Other Topics Concern  . Not on file   Social History Narrative  . No narrative on file    ROS: Please see the HPI.  All other systems reviewed and negative.  PHYSICAL EXAM:  BP 132/82  Pulse 60  Ht 6\' 2"  (1.88 m)  Wt 225 lb (102.059 kg)  BMI 28.89 kg/m2  General: Well developed, well nourished, in no acute distress. Head:  Normocephalic and atraumatic. Neck: no JVD Lungs: Clear to auscultation and percussion.  No wheezes.   Heart: Normal S1 and S2.  No significant murmur.   Pulses: Pulses normal in all 4 extremities. Extremities: No clubbing or cyanosis. No edema. Neurologic: Alert and oriented x 3.  EKG:  ASSESSMENT AND PLAN:

## 2011-07-15 NOTE — Assessment & Plan Note (Signed)
Stressed to Gene the desire to get him to euthyroid status so that lipid status would be as close to normal as possible.  Doubt he would become symptomatic.

## 2011-07-16 ENCOUNTER — Encounter (HOSPITAL_COMMUNITY)
Admission: RE | Admit: 2011-07-16 | Discharge: 2011-07-16 | Disposition: A | Discharge status: Home / Self Care | Payer: Medicare Other | Source: Ambulatory Visit | Attending: Cardiology | Admitting: Cardiology

## 2011-07-16 NOTE — Progress Notes (Signed)
During third station pt with low bp and admitted that he felt dizzy.  Bp 88/62.  Pt did not eat any breakfast. Pt given gatorade and peanut butter crackers.  Rechecked bp 102/62.  Pt instructed that he must eat prior to exercise.  Pt assured staff that he would comply.

## 2011-07-19 ENCOUNTER — Encounter (HOSPITAL_COMMUNITY)
Admission: RE | Admit: 2011-07-19 | Discharge: 2011-07-19 | Disposition: A | Discharge status: Home / Self Care | Payer: Medicare Other | Source: Ambulatory Visit | Attending: Cardiology | Admitting: Cardiology

## 2011-07-21 ENCOUNTER — Encounter (HOSPITAL_COMMUNITY)
Admission: RE | Admit: 2011-07-21 | Discharge: 2011-07-21 | Disposition: A | Discharge status: Home / Self Care | Payer: Medicare Other | Source: Ambulatory Visit | Attending: Cardiology | Admitting: Cardiology

## 2011-07-23 ENCOUNTER — Encounter (HOSPITAL_COMMUNITY): Payer: Medicare Other

## 2011-07-26 ENCOUNTER — Encounter (HOSPITAL_COMMUNITY): Payer: Medicare Other

## 2011-07-28 ENCOUNTER — Encounter (HOSPITAL_COMMUNITY)
Admission: RE | Admit: 2011-07-28 | Discharge: 2011-07-28 | Disposition: A | Discharge status: Home / Self Care | Payer: Medicare Other | Source: Ambulatory Visit | Attending: Cardiology | Admitting: Cardiology

## 2011-07-30 ENCOUNTER — Encounter (HOSPITAL_COMMUNITY)
Admission: RE | Admit: 2011-07-30 | Discharge: 2011-07-30 | Disposition: A | Discharge status: Home / Self Care | Payer: Medicare Other | Source: Ambulatory Visit | Attending: Cardiology | Admitting: Cardiology

## 2011-08-02 ENCOUNTER — Encounter (HOSPITAL_COMMUNITY)
Admission: RE | Admit: 2011-08-02 | Discharge: 2011-08-02 | Disposition: A | Discharge status: Home / Self Care | Payer: Medicare Other | Source: Ambulatory Visit | Attending: Cardiology | Admitting: Cardiology

## 2011-08-02 DIAGNOSIS — I2510 Atherosclerotic heart disease of native coronary artery without angina pectoris: Secondary | ICD-10-CM | POA: Insufficient documentation

## 2011-08-02 DIAGNOSIS — I080 Rheumatic disorders of both mitral and aortic valves: Secondary | ICD-10-CM | POA: Insufficient documentation

## 2011-08-02 DIAGNOSIS — E785 Hyperlipidemia, unspecified: Secondary | ICD-10-CM | POA: Diagnosis not present

## 2011-08-02 DIAGNOSIS — R011 Cardiac murmur, unspecified: Secondary | ICD-10-CM | POA: Insufficient documentation

## 2011-08-02 DIAGNOSIS — E039 Hypothyroidism, unspecified: Secondary | ICD-10-CM | POA: Insufficient documentation

## 2011-08-02 DIAGNOSIS — I4891 Unspecified atrial fibrillation: Secondary | ICD-10-CM | POA: Diagnosis not present

## 2011-08-02 DIAGNOSIS — Z8679 Personal history of other diseases of the circulatory system: Secondary | ICD-10-CM | POA: Insufficient documentation

## 2011-08-02 DIAGNOSIS — I10 Essential (primary) hypertension: Secondary | ICD-10-CM | POA: Diagnosis not present

## 2011-08-02 DIAGNOSIS — I495 Sick sinus syndrome: Secondary | ICD-10-CM | POA: Insufficient documentation

## 2011-08-02 DIAGNOSIS — Z5189 Encounter for other specified aftercare: Secondary | ICD-10-CM | POA: Insufficient documentation

## 2011-08-02 DIAGNOSIS — Z9861 Coronary angioplasty status: Secondary | ICD-10-CM | POA: Insufficient documentation

## 2011-08-02 DIAGNOSIS — E0590 Thyrotoxicosis, unspecified without thyrotoxic crisis or storm: Secondary | ICD-10-CM | POA: Insufficient documentation

## 2011-08-03 ENCOUNTER — Ambulatory Visit (INDEPENDENT_AMBULATORY_CARE_PROVIDER_SITE_OTHER): Payer: Medicare Other | Admitting: Family

## 2011-08-03 DIAGNOSIS — Z7901 Long term (current) use of anticoagulants: Secondary | ICD-10-CM

## 2011-08-03 DIAGNOSIS — I4891 Unspecified atrial fibrillation: Secondary | ICD-10-CM | POA: Diagnosis not present

## 2011-08-03 DIAGNOSIS — Z954 Presence of other heart-valve replacement: Secondary | ICD-10-CM

## 2011-08-03 NOTE — Patient Instructions (Signed)
  Latest dosing instructions   Total Sun Mon Tue Wed Thu Fri Sat   28 4 mg 4 mg 4 mg 4 mg 4 mg 4 mg 4 mg    (2 mg2) (2 mg2) (2 mg2) (2 mg2) (2 mg2) (2 mg2) (2 mg2)        

## 2011-08-04 ENCOUNTER — Encounter (HOSPITAL_COMMUNITY)
Admission: RE | Admit: 2011-08-04 | Discharge: 2011-08-04 | Disposition: A | Discharge status: Home / Self Care | Payer: Medicare Other | Source: Ambulatory Visit | Attending: Cardiology | Admitting: Cardiology

## 2011-08-05 DIAGNOSIS — M2390 Unspecified internal derangement of unspecified knee: Secondary | ICD-10-CM | POA: Diagnosis not present

## 2011-08-06 ENCOUNTER — Encounter (HOSPITAL_COMMUNITY): Payer: Medicare Other

## 2011-08-09 ENCOUNTER — Encounter (HOSPITAL_COMMUNITY): Payer: Medicare Other

## 2011-08-11 ENCOUNTER — Encounter (HOSPITAL_COMMUNITY): Payer: Medicare Other

## 2011-08-12 NOTE — Progress Notes (Signed)
Pt will be absent extensively for vacation out of town.

## 2011-08-13 ENCOUNTER — Encounter (HOSPITAL_COMMUNITY): Admission: RE | Admit: 2011-08-13 | Payer: Medicare Other | Source: Ambulatory Visit

## 2011-08-16 ENCOUNTER — Encounter (HOSPITAL_COMMUNITY): Payer: Medicare Other

## 2011-08-18 ENCOUNTER — Encounter (HOSPITAL_COMMUNITY): Payer: Medicare Other

## 2011-08-20 ENCOUNTER — Encounter (HOSPITAL_COMMUNITY): Payer: Medicare Other

## 2011-08-23 ENCOUNTER — Ambulatory Visit (INDEPENDENT_AMBULATORY_CARE_PROVIDER_SITE_OTHER): Payer: Medicare Other | Admitting: Cardiology

## 2011-08-23 ENCOUNTER — Ambulatory Visit (INDEPENDENT_AMBULATORY_CARE_PROVIDER_SITE_OTHER): Payer: Medicare Other | Admitting: Pharmacist

## 2011-08-23 ENCOUNTER — Encounter (HOSPITAL_COMMUNITY)
Admission: RE | Admit: 2011-08-23 | Discharge: 2011-08-23 | Disposition: A | Discharge status: Home / Self Care | Payer: Medicare Other | Source: Ambulatory Visit | Attending: Cardiology | Admitting: Cardiology

## 2011-08-23 ENCOUNTER — Encounter: Payer: Self-pay | Admitting: Cardiology

## 2011-08-23 VITALS — BP 136/74 | HR 60 | Ht 74.0 in | Wt 226.0 lb

## 2011-08-23 DIAGNOSIS — D6859 Other primary thrombophilia: Secondary | ICD-10-CM | POA: Diagnosis not present

## 2011-08-23 DIAGNOSIS — Z954 Presence of other heart-valve replacement: Secondary | ICD-10-CM | POA: Diagnosis not present

## 2011-08-23 DIAGNOSIS — I4892 Unspecified atrial flutter: Secondary | ICD-10-CM

## 2011-08-23 DIAGNOSIS — Z952 Presence of prosthetic heart valve: Secondary | ICD-10-CM

## 2011-08-23 DIAGNOSIS — I4891 Unspecified atrial fibrillation: Secondary | ICD-10-CM

## 2011-08-23 DIAGNOSIS — Z7901 Long term (current) use of anticoagulants: Secondary | ICD-10-CM

## 2011-08-23 DIAGNOSIS — E785 Hyperlipidemia, unspecified: Secondary | ICD-10-CM

## 2011-08-23 DIAGNOSIS — E039 Hypothyroidism, unspecified: Secondary | ICD-10-CM

## 2011-08-24 MED ORDER — HYDROCHLOROTHIAZIDE 25 MG PO TABS: 25.0000 mg | ORAL_TABLET | Freq: Every day | ORAL | Status: DC

## 2011-08-25 ENCOUNTER — Encounter (HOSPITAL_COMMUNITY)
Admission: RE | Admit: 2011-08-25 | Discharge: 2011-08-25 | Disposition: A | Discharge status: Home / Self Care | Payer: Medicare Other | Source: Ambulatory Visit | Attending: Cardiology | Admitting: Cardiology

## 2011-08-25 NOTE — Progress Notes (Signed)
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  No major issues.  Has not had ANY angina since the time of his surgery.  He does have periods where he questions about wether he is out of rhythm, and he cut back on his thyroid replacement, thinking perhaps that slightly elevated TSH might be the best place for him to land.  He will recheck this and wants to discuss with Dr. Leslie [name].  Of note, the patient and I have also been discussing, given his MAZE, the liklihood of coming off of Amiodarone, although he has not had specific issues.    Current Outpatient Prescriptions  Medication Sig Dispense Refill  . amiodarone (PACERONE) 200 MG tablet Take 200 mg by mouth daily.      Marland Kitchen aspirin 81 MG tablet Take 81 mg by mouth daily.       . bisacodyl (DULCOLAX) 5 MG EC tablet Take 5 mg by mouth daily as needed.      . Cholecalciferol (VITAMIN D PO) Take 2,000 Units by mouth daily.       Marland Kitchen co-enzyme Q-10 30 MG capsule Take 300 mg by mouth daily. Every other day      . ferrous gluconate (FERGON) 324 MG tablet Take 324 mg by mouth every 3 (three) days.       . fish oil-omega-3 fatty acids 1000 MG capsule Take 1 g by mouth as needed.       Marland Kitchen HYDROcodone-acetaminophen (NORCO) 5-325 MG per tablet Take 1 tablet by mouth every 4 (four) hours as needed for pain. For pain  50 tablet  0  . levothyroxine (SYNTHROID, LEVOTHROID) 50 MCG tablet Take 50 mcg by mouth daily.        Marland Kitchen LORazepam (ATIVAN) 1 MG tablet Take 1 mg by mouth at bedtime. For anxiety      . metoprolol succinate (TOPROL-XL) 50 MG 24 hr tablet Take 12.5 mg by mouth daily. Take with or immediately following a meal.      . polyethylene glycol (MIRALAX / GLYCOLAX) packet Take 17 g by mouth as needed.      . potassium chloride (K-DUR,KLOR-CON) 10 MEQ tablet Take 1 tablet (10 mEq total) by mouth 2 (two) times daily.  60 tablet  6  . rosuvastatin (CRESTOR) 20 MG tablet Take 20 mg by mouth daily.        . Tamsulosin HCl (FLOMAX) 0.4 MG CAPS Take 1 capsule (0.4  mg total) by mouth at bedtime every other day  1 capsule    . warfarin (COUMADIN) 2 MG tablet Take 1 tablet (2 mg total) by mouth as directed.  60 tablet  6  . hydrochlorothiazide (HYDRODIURIL) 25 MG tablet Take 1 tablet (25 mg total) by mouth daily.  90 tablet  3    Allergies  Allergen Reactions  . Contrast Media (Iodinated Diagnostic Agents) Hives    Past Medical History  Diagnosis Date  . Chronotropic incompetence with sinus node dysfunction     Status post Guidant dual-mode, dual-pacing, dual-sensing  pacemaker   implantation now programmed to AAI with recent generator change.  . Coronary artery disease     status post multiple prior percutaneous coronary interventions, microvascular angina per Dr [name]  . Aortic stenosis     moderate aortic stenosis  . Hyperlipidemia   . Hypercoagulable state     chronically anticoagulated with coumadin  . Heart murmur   . Stroke     1990  . Hyperthyroidism   . Benign  prostatic hypertrophy   . Arthritis   . Paroxysmal atrial fibrillation     DR. [name],   . Hypothyroidism     Dr. Leslie [name]    Past Surgical History  Procedure Date  . Pacemaker insertion 1991    Guidant PPM, most recent Generator Change by Dr [name] was 08/22/06  . Hemrrhoidectomy   . Cardiac catheterization     11  . Cardioversion   . Appendectomy   . Aortic valve replacement 03/15/2011    Procedure: AORTIC VALVE REPLACEMENT (AVR);  Surgeon: Alleen Borne, MD;  Location[facility] OR;  Service: Open Heart Surgery;  Laterality: N/A;  . Maze 03/15/2011    Procedure: MAZE;  Surgeon: Alleen Borne, MD;  Location: [facility] OR;  Service: Open Heart Surgery;  Laterality: N/A;  . Tee without cardioversion 04/15/2011    Procedure: TRANSESOPHAGEAL ECHOCARDIOGRAM (TEE);  Surgeon: Marca Ancona, MD;  Location: [facility] ENDOSCOPY;  Service: Cardiovascular;  Laterality: N/A;  . Cardioversion 04/15/2011    Procedure: CARDIOVERSION;  Surgeon: Marca Ancona, MD;  Location: [facility] ENDOSCOPY;  Service:  Cardiovascular;  Laterality: N/A;    Family History  Problem Relation Age of Onset  . Heart disease Brother     Twin brother has coronary disease and recent AVR for AS  . Anesthesia problems Neg Hx   . Hypotension Neg Hx   . Malignant hyperthermia Neg Hx   . Pseudochol deficiency Neg Hx     History   Social History  . Marital Status: Married    Spouse Name: N/A    Number of Children: N/A  . Years of Education: N/A   Occupational History  . Not on file.   Social History Main Topics  . Smoking status: Never Smoker   . Smokeless tobacco: Not on file  . Alcohol Use: No     occassional  . Drug Use: No  . Sexually Active: Not on file   Other Topics Concern  . Not on file   Social History Narrative  . No narrative on file    ROS: Please see the HPI.  All other systems reviewed and negative.  PHYSICAL EXAM:  BP 136/74  Pulse 60  Ht 6\' 2"  (1.88 m)  Wt 226 lb (102.513 kg)  BMI 29.02 kg/m2  General: Well developed, well nourished, in no acute distress. Head:  Normocephalic and atraumatic. Neck: no JVD Lungs: Clear to auscultation and percussion. Heart: Normal S1 and S2.  No murmur, rubs or gallops.  Abdomen:  Normal bowel sounds; soft; non tender; no organomegaly Pulses: Pulses normal in all 4 extremities. Extremities: No clubbing or cyanosis. No edema. Neurologic: Alert and oriented x 3.  EKG:  Atrial pacing.  PR prolongation.  RBBB.    ASSESSMENT AND PLAN:

## 2011-08-27 ENCOUNTER — Encounter (HOSPITAL_COMMUNITY)
Admission: RE | Admit: 2011-08-27 | Discharge: 2011-08-27 | Disposition: A | Discharge status: Home / Self Care | Payer: Medicare Other | Source: Ambulatory Visit | Attending: Cardiology | Admitting: Cardiology

## 2011-08-27 DIAGNOSIS — E038 Other specified hypothyroidism: Secondary | ICD-10-CM | POA: Diagnosis not present

## 2011-08-30 ENCOUNTER — Encounter (HOSPITAL_COMMUNITY): Admission: RE | Admit: 2011-08-30 | Payer: Medicare Other | Source: Ambulatory Visit

## 2011-09-01 ENCOUNTER — Encounter (HOSPITAL_COMMUNITY): Payer: Medicare Other

## 2011-09-03 ENCOUNTER — Encounter (HOSPITAL_COMMUNITY): Payer: Medicare Other

## 2011-09-03 DIAGNOSIS — I495 Sick sinus syndrome: Secondary | ICD-10-CM | POA: Insufficient documentation

## 2011-09-03 DIAGNOSIS — Z9861 Coronary angioplasty status: Secondary | ICD-10-CM | POA: Insufficient documentation

## 2011-09-03 DIAGNOSIS — Z8679 Personal history of other diseases of the circulatory system: Secondary | ICD-10-CM | POA: Insufficient documentation

## 2011-09-03 DIAGNOSIS — I10 Essential (primary) hypertension: Secondary | ICD-10-CM | POA: Insufficient documentation

## 2011-09-03 DIAGNOSIS — Z5189 Encounter for other specified aftercare: Secondary | ICD-10-CM | POA: Insufficient documentation

## 2011-09-03 DIAGNOSIS — E785 Hyperlipidemia, unspecified: Secondary | ICD-10-CM | POA: Insufficient documentation

## 2011-09-03 DIAGNOSIS — E039 Hypothyroidism, unspecified: Secondary | ICD-10-CM | POA: Insufficient documentation

## 2011-09-03 DIAGNOSIS — E0590 Thyrotoxicosis, unspecified without thyrotoxic crisis or storm: Secondary | ICD-10-CM | POA: Insufficient documentation

## 2011-09-03 DIAGNOSIS — R011 Cardiac murmur, unspecified: Secondary | ICD-10-CM | POA: Insufficient documentation

## 2011-09-03 DIAGNOSIS — I4891 Unspecified atrial fibrillation: Secondary | ICD-10-CM | POA: Insufficient documentation

## 2011-09-03 DIAGNOSIS — I2510 Atherosclerotic heart disease of native coronary artery without angina pectoris: Secondary | ICD-10-CM | POA: Insufficient documentation

## 2011-09-03 DIAGNOSIS — I080 Rheumatic disorders of both mitral and aortic valves: Secondary | ICD-10-CM | POA: Insufficient documentation

## 2011-09-06 ENCOUNTER — Encounter (HOSPITAL_COMMUNITY): Payer: Medicare Other

## 2011-09-07 ENCOUNTER — Other Ambulatory Visit: Payer: Self-pay

## 2011-09-07 ENCOUNTER — Other Ambulatory Visit (INDEPENDENT_AMBULATORY_CARE_PROVIDER_SITE_OTHER): Payer: Medicare Other

## 2011-09-07 DIAGNOSIS — I4891 Unspecified atrial fibrillation: Secondary | ICD-10-CM

## 2011-09-07 LAB — T4, FREE: Free T4: 0.86 ng/dL (ref 0.60–1.60)

## 2011-09-07 LAB — CBC WITH DIFFERENTIAL/PLATELET
Eosinophils Relative: 4.9 % (ref 0.0–5.0)
Lymphocytes Relative: 21.1 % (ref 12.0–46.0)
Monocytes Relative: 8.8 % (ref 3.0–12.0)
Neutrophils Relative %: 64.5 % (ref 43.0–77.0)
Platelets: 185 10*3/uL (ref 150.0–400.0)
WBC: 4.4 10*3/uL — ABNORMAL LOW (ref 4.5–10.5)

## 2011-09-07 LAB — BASIC METABOLIC PANEL
BUN: 22 mg/dL (ref 6–23)
Creatinine, Ser: 1.1 mg/dL (ref 0.4–1.5)
GFR: 71.61 mL/min (ref >60.00)
Potassium: 4.1 mEq/L (ref 3.5–5.1)

## 2011-09-07 LAB — HEPATIC FUNCTION PANEL
Bilirubin, Direct: 0 mg/dL (ref 0.0–0.3)
Total Bilirubin: 0.7 mg/dL (ref 0.3–1.2)

## 2011-09-07 LAB — TSH: TSH: 7.16 u[IU]/mL — ABNORMAL HIGH (ref 0.35–5.50)

## 2011-09-08 ENCOUNTER — Encounter (HOSPITAL_COMMUNITY)
Admission: RE | Admit: 2011-09-08 | Discharge: 2011-09-08 | Disposition: A | Discharge status: Home / Self Care | Payer: Medicare Other | Source: Ambulatory Visit | Attending: Cardiology | Admitting: Cardiology

## 2011-09-08 ENCOUNTER — Other Ambulatory Visit: Payer: Self-pay

## 2011-09-08 DIAGNOSIS — I495 Sick sinus syndrome: Secondary | ICD-10-CM | POA: Diagnosis not present

## 2011-09-08 DIAGNOSIS — I10 Essential (primary) hypertension: Secondary | ICD-10-CM | POA: Diagnosis not present

## 2011-09-08 DIAGNOSIS — Z8679 Personal history of other diseases of the circulatory system: Secondary | ICD-10-CM | POA: Diagnosis not present

## 2011-09-08 DIAGNOSIS — E039 Hypothyroidism, unspecified: Secondary | ICD-10-CM | POA: Diagnosis not present

## 2011-09-08 DIAGNOSIS — I4891 Unspecified atrial fibrillation: Secondary | ICD-10-CM | POA: Diagnosis not present

## 2011-09-08 DIAGNOSIS — Z9861 Coronary angioplasty status: Secondary | ICD-10-CM | POA: Diagnosis not present

## 2011-09-08 DIAGNOSIS — E785 Hyperlipidemia, unspecified: Secondary | ICD-10-CM | POA: Diagnosis not present

## 2011-09-08 DIAGNOSIS — I080 Rheumatic disorders of both mitral and aortic valves: Secondary | ICD-10-CM | POA: Diagnosis not present

## 2011-09-08 DIAGNOSIS — E0590 Thyrotoxicosis, unspecified without thyrotoxic crisis or storm: Secondary | ICD-10-CM | POA: Diagnosis not present

## 2011-09-08 DIAGNOSIS — Z5189 Encounter for other specified aftercare: Secondary | ICD-10-CM | POA: Diagnosis not present

## 2011-09-08 DIAGNOSIS — R011 Cardiac murmur, unspecified: Secondary | ICD-10-CM | POA: Diagnosis not present

## 2011-09-08 DIAGNOSIS — I2510 Atherosclerotic heart disease of native coronary artery without angina pectoris: Secondary | ICD-10-CM | POA: Diagnosis not present

## 2011-09-10 ENCOUNTER — Encounter (HOSPITAL_COMMUNITY): Payer: Medicare Other

## 2011-09-12 NOTE — Assessment & Plan Note (Signed)
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  Needs lifelong anticoagulation.

## 2011-09-12 NOTE — Assessment & Plan Note (Signed)
Last LDL was 74mg /dl.

## 2011-09-12 NOTE — Assessment & Plan Note (Signed)
Has adjusted his own dose.  Will follow up with endocrinology.

## 2011-09-12 NOTE — Assessment & Plan Note (Signed)
Doing well.  No murmur.  Much improved exercise tolerance.  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

## 2011-09-12 NOTE — Assessment & Plan Note (Signed)
Will give a trial off of Amiodarone when he is in town to see how he does.

## 2011-10-06 ENCOUNTER — Other Ambulatory Visit: Payer: Self-pay

## 2011-10-06 MED ORDER — ROSUVASTATIN CALCIUM 20 MG PO TABS: 20.0000 mg | ORAL_TABLET | Freq: Every day | ORAL | Status: DC

## 2011-10-15 ENCOUNTER — Ambulatory Visit (INDEPENDENT_AMBULATORY_CARE_PROVIDER_SITE_OTHER): Payer: Medicare Other

## 2011-10-15 DIAGNOSIS — Z7901 Long term (current) use of anticoagulants: Secondary | ICD-10-CM | POA: Diagnosis not present

## 2011-10-15 DIAGNOSIS — I4891 Unspecified atrial fibrillation: Secondary | ICD-10-CM

## 2011-10-15 DIAGNOSIS — Z954 Presence of other heart-valve replacement: Secondary | ICD-10-CM | POA: Diagnosis not present

## 2011-10-22 ENCOUNTER — Ambulatory Visit (INDEPENDENT_AMBULATORY_CARE_PROVIDER_SITE_OTHER): Payer: Medicare Other | Admitting: Internal Medicine

## 2011-10-22 ENCOUNTER — Encounter: Payer: Medicare Other | Admitting: Internal Medicine

## 2011-10-22 ENCOUNTER — Encounter: Payer: Self-pay | Admitting: Internal Medicine

## 2011-10-22 VITALS — BP 110/70 | HR 100

## 2011-10-22 DIAGNOSIS — I4891 Unspecified atrial fibrillation: Secondary | ICD-10-CM

## 2011-10-22 DIAGNOSIS — I2510 Atherosclerotic heart disease of native coronary artery without angina pectoris: Secondary | ICD-10-CM | POA: Diagnosis not present

## 2011-10-22 DIAGNOSIS — I4892 Unspecified atrial flutter: Secondary | ICD-10-CM | POA: Diagnosis not present

## 2011-10-22 LAB — PACEMAKER DEVICE OBSERVATION
BMOD-0002RA: 8
BMOD-0003RA: 30
DEVICE MODEL PM: 567685
RV LEAD AMPLITUDE: 5.1 mv

## 2011-10-22 NOTE — Patient Instructions (Addendum)
Your physician has recommended you make the following change in your medication: RESTART Amiodarone 200 mg take two by mouth daily for 2 WEEKS and then decrease to one tablet by mouth daily 

## 2011-10-22 NOTE — Progress Notes (Signed)
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 The patient stop taking amiodarone approximately 5 weeks ago. Earlier today, he noted palpitations and presents for additional evaluation. He thinks that his heart is out of rhythm. He denies syncope. He has shortness of breath when his heart races. He has taken his pulse and found it to be over 100 beats per minute. His INRs have been therapeutic. Allergies  Allergen Reactions  . Contrast Media (Iodinated Diagnostic Agents) Hives     Current Outpatient Prescriptions  Medication Sig Dispense Refill  . amiodarone (PACERONE) 200 MG tablet Take two tablets by mouth daily for 2 weeks and then decrease to one tablet daily  1 tablet  0  . aspirin 81 MG tablet Take 81 mg by mouth daily.       . bisacodyl (DULCOLAX) 5 MG EC tablet Take 5 mg by mouth daily as needed.      . Cholecalciferol (VITAMIN D PO) Take 2,000 Units by mouth daily.       Marland Kitchen co-enzyme Q-10 30 MG capsule Take 300 mg by mouth daily. Every other day      . ferrous gluconate (FERGON) 324 MG tablet Take 324 mg by mouth every 3 (three) days.       . fish oil-omega-3 fatty acids 1000 MG capsule Take 1 g by mouth as needed.       . hydrochlorothiazide (HYDRODIURIL) 25 MG tablet Take 1 tablet (25 mg total) by mouth daily.  90 tablet  3  . HYDROcodone-acetaminophen (NORCO) 5-325 MG per tablet Take 1 tablet by mouth every 4 (four) hours as needed for pain. For pain  50 tablet  0  . levothyroxine (SYNTHROID, LEVOTHROID) 50 MCG tablet Take 50 mcg by mouth daily.        Marland Kitchen LORazepam (ATIVAN) 1 MG tablet Take 1 mg by mouth at bedtime. For anxiety      . metoprolol succinate (TOPROL-XL) 50 MG 24 hr tablet Take 12.5 mg by mouth daily. Take with or immediately following a meal.      . polyethylene  glycol (MIRALAX / GLYCOLAX) packet Take 17 g by mouth as needed.      . potassium chloride (K-DUR,KLOR-CON) 10 MEQ tablet Take 1 tablet (10 mEq total) by mouth 2 (two) times daily.  60 tablet  6  . rosuvastatin (CRESTOR) 20 MG tablet Take 1 tablet (20 mg total) by mouth daily.  90 tablet  3  . Tamsulosin HCl (FLOMAX) 0.4 MG CAPS Take 1 capsule (0.4 mg total) by mouth at bedtime every other day  1 capsule    . warfarin (COUMADIN) 2 MG tablet Take 1 tablet (2 mg total) by mouth as directed.  60 tablet  6     Past Medical History  Diagnosis Date  . Chronotropic incompetence with sinus node dysfunction     Status post Guidant dual-mode, dual-pacing, dual-sensing  pacemaker   implantation now programmed to AAI with recent generator change.  . Coronary artery disease     status post multiple prior percutaneous coronary interventions, microvascular angina per Dr [name]  . Aortic stenosis     moderate aortic stenosis  . Hyperlipidemia   . Hypercoagulable state     chronically anticoagulated with coumadin  . Heart murmur   . Stroke     1990  . Hyperthyroidism   .  Benign prostatic hypertrophy   . Arthritis   . Paroxysmal atrial fibrillation     DR. STuckey,   . Hypothyroidism     Dr. [name]    ROS:   All systems reviewed and negative except as noted in the HPI.   Past Surgical History  Procedure Date  . Pacemaker insertion 1991    Guidant PPM, most recent Generator Change by Dr [name] was 08/22/06  . Hemrrhoidectomy   . Cardiac catheterization     11  . Cardioversion   . Appendectomy   . Aortic valve replacement 03/15/2011    Procedure: AORTIC VALVE REPLACEMENT (AVR);  Surgeon: Alleen Borne, MD;  Location: [facility] OR;  Service: Open Heart Surgery;  Laterality: N/A;  . Maze 03/15/2011    Procedure: MAZE;  Surgeon: Alleen Borne, MD;  Location: [facility] OR;  Service: Open Heart Surgery;  Laterality: N/A;  . Tee without cardioversion 04/15/2011    Procedure: TRANSESOPHAGEAL ECHOCARDIOGRAM  (TEE);  Surgeon: Marca Ancona, MD;  Location: [facility] ENDOSCOPY;  Service: Cardiovascular;  Laterality: N/A;  . Cardioversion 04/15/2011    Procedure: CARDIOVERSION;  Surgeon: Marca Ancona, MD;  Location: [facility] ENDOSCOPY;  Service: Cardiovascular;  Laterality: N/A;     Family History  Problem Relation Age of Onset  . Heart disease Brother     Twin brother has coronary disease and recent AVR for AS  . Anesthesia problems Neg Hx   . Hypotension Neg Hx   . Malignant hyperthermia Neg Hx   . Pseudochol deficiency Neg Hx      History   Social History  . Marital Status: Married    Spouse Name: N/A    Number of Children: N/A  . Years of Education: N/A   Occupational History  . Not on file.   Social History Main Topics  . Smoking status: Never Smoker   . Smokeless tobacco: Not on file  . Alcohol Use: No     occassional  . Drug Use: No  . Sexually Active: Not on file   Other Topics Concern  . Not on file   Social History Narrative  . No narrative on file     BP 110/70  Pulse 100  Physical Exam:  Well appearing 77 year old man, NAD HEENT: Unremarkable Neck:  No JVD, no thyromegally Lungs:  Clear with no wheezes, rales, or rhonchi. HEART:  Regular rate rhythm, no murmurs, no rubs, no clicks Abd:  soft, positive bowel sounds, no organomegally, no rebound, no guarding Ext:  2 plus pulses, no edema, no cyanosis, no clubbing Skin:  No rashes no nodules Neuro:  CN II through XII intact, motor grossly intact  DEVICE  Normal device function.  See PaceArt for details. Underlying rhythm is atrial flutter  Assess/Plan:

## 2011-10-22 NOTE — Patient Instructions (Addendum)
Your physician has recommended you make the following change in your medication: RESTART Amiodarone 200 mg take two by mouth daily for 2 WEEKS and then decrease to one tablet by mouth daily

## 2011-10-22 NOTE — Assessment & Plan Note (Signed)
He denies anginal symptoms. Will follow. 

## 2011-10-22 NOTE — Assessment & Plan Note (Signed)
After confirming that his INR has been therapeutic, most recently less than a week ago, anti-tachycardic pacing was carried out through his pacemaker. Pacing at a cycle length of 210 ms, atrial flutter was terminated in sinus rhythm restored. He tolerated this very well. I've instructed the patient to go back on amiodarone 40 mg daily for 2 weeks followed by 200 mg a day thereafter. He is scheduled to undergo a trip to [place] and [place] in several months. I've asked the patient to stay on his amiodarone until he comes back from his travel. Further, he is instructed to call his endocrinologist for recommendations on his thyroid function.

## 2011-10-22 NOTE — Assessment & Plan Note (Signed)
He has had no atrial fibrillation in the last several months.

## 2011-10-25 NOTE — Progress Notes (Signed)
This encounter was created in error - please disregard.

## 2011-10-27 ENCOUNTER — Encounter: Payer: Self-pay | Admitting: Internal Medicine

## 2011-11-18 DIAGNOSIS — H2510 Age-related nuclear cataract, unspecified eye: Secondary | ICD-10-CM | POA: Diagnosis not present

## 2011-11-18 DIAGNOSIS — H02839 Dermatochalasis of unspecified eye, unspecified eyelid: Secondary | ICD-10-CM | POA: Diagnosis not present

## 2011-12-01 ENCOUNTER — Ambulatory Visit (INDEPENDENT_AMBULATORY_CARE_PROVIDER_SITE_OTHER): Payer: Medicare Other | Admitting: Cardiology

## 2011-12-01 ENCOUNTER — Encounter: Payer: Self-pay | Admitting: Cardiology

## 2011-12-01 VITALS — BP 136/78 | HR 60 | Ht 74.0 in | Wt 227.0 lb

## 2011-12-01 DIAGNOSIS — I2510 Atherosclerotic heart disease of native coronary artery without angina pectoris: Secondary | ICD-10-CM

## 2011-12-01 DIAGNOSIS — I4891 Unspecified atrial fibrillation: Secondary | ICD-10-CM

## 2011-12-01 DIAGNOSIS — Z7901 Long term (current) use of anticoagulants: Secondary | ICD-10-CM

## 2011-12-01 DIAGNOSIS — I4892 Unspecified atrial flutter: Secondary | ICD-10-CM

## 2011-12-01 DIAGNOSIS — D6859 Other primary thrombophilia: Secondary | ICD-10-CM

## 2011-12-01 DIAGNOSIS — Z954 Presence of other heart-valve replacement: Secondary | ICD-10-CM

## 2011-12-01 DIAGNOSIS — E039 Hypothyroidism, unspecified: Secondary | ICD-10-CM

## 2011-12-01 DIAGNOSIS — Z952 Presence of prosthetic heart valve: Secondary | ICD-10-CM

## 2011-12-01 DIAGNOSIS — Z79899 Other long term (current) drug therapy: Secondary | ICD-10-CM

## 2011-12-01 LAB — LIPID PANEL
Cholesterol: 166 mg/dL (ref 0–200)
VLDL: 21.8 mg/dL (ref 0.0–40.0)

## 2011-12-01 LAB — CBC WITH DIFFERENTIAL/PLATELET
Basophils Relative: 0.9 % (ref 0.0–3.0)
Eosinophils Absolute: 0.2 10*3/uL (ref 0.0–0.7)
Eosinophils Relative: 3.8 % (ref 0.0–5.0)
Lymphocytes Relative: 20.6 % (ref 12.0–46.0)
MCHC: 33.6 g/dL (ref 30.0–36.0)
Neutrophils Relative %: 64.6 % (ref 43.0–77.0)
RBC: 4.44 Mil/uL (ref 4.22–5.81)
WBC: 4.4 10*3/uL — ABNORMAL LOW (ref 4.5–10.5)

## 2011-12-01 LAB — HEPATIC FUNCTION PANEL
ALT: 22 U/L (ref 0–53)
AST: 30 U/L (ref 0–37)
Bilirubin, Direct: 0.1 mg/dL (ref 0.0–0.3)
Total Protein: 7.7 g/dL (ref 6.0–8.3)

## 2011-12-01 LAB — BASIC METABOLIC PANEL
CO2: 29 mEq/L (ref 19–32)
Calcium: 9.3 mg/dL (ref 8.4–10.5)
Creatinine, Ser: 0.9 mg/dL (ref 0.4–1.5)
GFR: 84.13 mL/min (ref >60.00)
Glucose, Bld: 104 mg/dL — ABNORMAL HIGH (ref 70–99)
Sodium: 142 mEq/L (ref 135–145)

## 2011-12-01 LAB — T3, FREE: T3, Free: 2.4 pg/mL (ref 2.3–4.2)

## 2011-12-01 LAB — PROTIME-INR
INR: 2.7 ratio — ABNORMAL HIGH (ref 0.8–1.0)
Prothrombin Time: 27.6 s — ABNORMAL HIGH (ref 10.2–12.4)

## 2011-12-01 NOTE — Patient Instructions (Addendum)
Labs drawn today.  Your physician has requested that you have an echocardiogram in November. Echocardiography is a painless test that uses sound waves to create images of your heart. It provides your doctor with information about the size and shape of your heart and how well your heart's chambers and valves are working. This procedure takes approximately one hour. There are no restrictions for this procedure.  Your physician recommends that you continue on your current medications as directed. Please refer to the Current Medication list given to you today.

## 2011-12-12 NOTE — Assessment & Plan Note (Signed)
Lifelong anticoagulation 

## 2011-12-12 NOTE — Assessment & Plan Note (Signed)
Dramatically improved.  Will check 2D ECHO at some point to assess current valve status.

## 2011-12-12 NOTE — Assessment & Plan Note (Signed)
The patient had post op recurrence, when he came off of Amio, but now back on.  Will need to watch thyroid status carefully at present.

## 2011-12-12 NOTE — Progress Notes (Signed)
HPI:  The patient is in for his followup visit. Since the time of his surgery, he's had a marked fairly dramatic improvement in his exercise tolerance. He now does not get any chest pain. Her Holter been to stop amiodarone, but he developed recurrent atrial fibrillation and had to be placed back on. He is now back on that drug. He is controlled on Coumadin anticoagulation without difficulty. He is been watching his electrolytes quite carefully. He is also been working with the endocrinologists to adjust his thyroid hormone level. He does have some postural orthostasis, he gets modestly symptomatic when he changes position. 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  Current Outpatient Prescriptions  Medication Sig Dispense Refill  . amiodarone (PACERONE) 200 MG tablet one tablet daily  1 tablet  0  . aspirin 81 MG tablet Take 81 mg by mouth daily.       . bisacodyl (DULCOLAX) 5 MG EC tablet Take 5 mg by mouth daily as needed.      . Cholecalciferol (VITAMIN D PO) Take 2,000 Units by mouth daily.       . fish oil-omega-3 fatty acids 1000 MG capsule Take 1 g by mouth as needed.       . hydrochlorothiazide (HYDRODIURIL) 25 MG tablet Take 1 tablet (25 mg total) by mouth daily.  90 tablet  3  . HYDROcodone-acetaminophen (NORCO) 5-325 MG per tablet Take 1 tablet by mouth every 4 (four) hours as needed for pain. For pain  50 tablet  0  . levothyroxine (SYNTHROID, LEVOTHROID) 50 MCG tablet Take 50 mcg by mouth daily.        Marland Kitchen LORazepam (ATIVAN) 1 MG tablet Take 1 mg by mouth at bedtime. For anxiety      . metoprolol succinate (TOPROL-XL) 50 MG 24 hr tablet Take 25mg  daily      . potassium chloride (K-DUR,KLOR-CON) 10 MEQ tablet Take 1 tablet (10 mEq total) by mouth 2 (two) times daily.  60 tablet  6  . rosuvastatin (CRESTOR) 20 MG tablet Take 1 tablet (20 mg total) by mouth daily.  90 tablet  3  . Tamsulosin HCl (FLOMAX) 0.4 MG CAPS Take 1 capsule (0.4 mg total) by mouth at bedtime every other day  1  capsule    . warfarin (COUMADIN) 2 MG tablet Take 1 tablet (2 mg total) by mouth as directed.  60 tablet  6    Allergies  Allergen Reactions  . Contrast Media (Iodinated Diagnostic Agents) Hives    Past Medical History  Diagnosis Date  . Chronotropic incompetence with sinus node dysfunction     Status post Guidant dual-mode, dual-pacing, dual-sensing  pacemaker   implantation now programmed to AAI with recent generator change.  . Coronary artery disease     status post multiple prior percutaneous coronary interventions, microvascular angina per Dr [name]  . Aortic stenosis     moderate aortic stenosis  . Hyperlipidemia   . Hypercoagulable state     chronically anticoagulated with coumadin  . Heart murmur   . Stroke     1990  . Hyperthyroidism   . Benign prostatic hypertrophy   . Arthritis   . Paroxysmal atrial fibrillation     DR. Eryk Beavers,   . Hypothyroidism     Dr. [name]    Past Surgical History  Procedure Date  . Pacemaker insertion 1991    Guidant PPM, most recent Generator Change by Dr [name] was 08/22/06  . Hemrrhoidectomy   . Cardiac catheterization  11  . Cardioversion   . Appendectomy   . Aortic valve replacement 03/15/2011    Procedure: AORTIC VALVE REPLACEMENT (AVR);  Surgeon: Alleen Borne, MD;  Location: [facility] OR;  Service: Open Heart Surgery;  Laterality: N/A;  . Maze 03/15/2011    Procedure: MAZE;  Surgeon: Alleen Borne, MD;  Location: [facility] OR;  Service: Open Heart Surgery;  Laterality: N/A;  . Tee without cardioversion 04/15/2011    Procedure: TRANSESOPHAGEAL ECHOCARDIOGRAM (TEE);  Surgeon: Marca Ancona, MD;  Location: [facility] ENDOSCOPY;  Service: Cardiovascular;  Laterality: N/A;  . Cardioversion 04/15/2011    Procedure: CARDIOVERSION;  Surgeon: Marca Ancona, MD;  Location: [facility] ENDOSCOPY;  Service: Cardiovascular;  Laterality: N/A;    Family History  Problem Relation Age of Onset  . Heart disease Brother     Twin brother has coronary disease and  recent AVR for AS  . Anesthesia problems Neg Hx   . Hypotension Neg Hx   . Malignant hyperthermia Neg Hx   . Pseudochol deficiency Neg Hx     History   Social History  . Marital Status: Married    Spouse Name: N/A    Number of Children: N/A  . Years of Education: N/A   Occupational History  . Not on file.   Social History Main Topics  . Smoking status: Never Smoker   . Smokeless tobacco: Not on file  . Alcohol Use: No     occassional  . Drug Use: No  . Sexually Active: Not on file   Other Topics Concern  . Not on file   Social History Narrative  . No narrative on file    ROS: Please see the HPI.  All other systems reviewed and negative.  PHYSICAL EXAM:  BP 136/78  Pulse 60  Ht 6\' 2"  (1.88 m)  Wt 227 lb (102.967 kg)  BMI 29.15 kg/m2  General: Well developed, well nourished, in no acute distress. Head:  Normocephalic and atraumatic. Neck: no JVD Lungs: Clear to auscultation and percussion. Heart: Normal S1 and S2.  Minimal to no systolic ejection murmur.  No AI.   Pulses: Pulses normal in all 4 extremities. Extremities: No clubbing or cyanosis. No edema. Neurologic: Alert and oriented x 3.  EKG:  Atrial pacing with RBBB.    ASSESSMENT AND PLAN:

## 2011-12-12 NOTE — Assessment & Plan Note (Signed)
Followed by Dr. Altheimer.  

## 2012-01-05 ENCOUNTER — Other Ambulatory Visit: Payer: Self-pay

## 2012-01-05 MED ORDER — LORAZEPAM 1 MG PO TABS: ORAL_TABLET | ORAL | Status: DC

## 2012-01-17 ENCOUNTER — Other Ambulatory Visit (HOSPITAL_COMMUNITY): Payer: Medicare Other

## 2012-01-18 DIAGNOSIS — H2510 Age-related nuclear cataract, unspecified eye: Secondary | ICD-10-CM | POA: Diagnosis not present

## 2012-01-19 ENCOUNTER — Ambulatory Visit (HOSPITAL_COMMUNITY): Payer: Medicare Other | Attending: Cardiology | Admitting: Radiology

## 2012-01-19 ENCOUNTER — Ambulatory Visit (INDEPENDENT_AMBULATORY_CARE_PROVIDER_SITE_OTHER): Payer: Medicare Other | Admitting: Pharmacist

## 2012-01-19 ENCOUNTER — Other Ambulatory Visit: Payer: Self-pay

## 2012-01-19 DIAGNOSIS — I4891 Unspecified atrial fibrillation: Secondary | ICD-10-CM

## 2012-01-19 DIAGNOSIS — Z954 Presence of other heart-valve replacement: Secondary | ICD-10-CM | POA: Diagnosis not present

## 2012-01-19 DIAGNOSIS — D6859 Other primary thrombophilia: Secondary | ICD-10-CM

## 2012-01-19 DIAGNOSIS — Z7901 Long term (current) use of anticoagulants: Secondary | ICD-10-CM

## 2012-01-19 DIAGNOSIS — I059 Rheumatic mitral valve disease, unspecified: Secondary | ICD-10-CM | POA: Diagnosis not present

## 2012-01-19 DIAGNOSIS — I359 Nonrheumatic aortic valve disorder, unspecified: Secondary | ICD-10-CM | POA: Diagnosis not present

## 2012-01-19 DIAGNOSIS — I2510 Atherosclerotic heart disease of native coronary artery without angina pectoris: Secondary | ICD-10-CM

## 2012-01-19 DIAGNOSIS — I4510 Unspecified right bundle-branch block: Secondary | ICD-10-CM | POA: Diagnosis not present

## 2012-01-19 DIAGNOSIS — I517 Cardiomegaly: Secondary | ICD-10-CM | POA: Insufficient documentation

## 2012-01-19 DIAGNOSIS — E785 Hyperlipidemia, unspecified: Secondary | ICD-10-CM | POA: Insufficient documentation

## 2012-01-19 DIAGNOSIS — Z79899 Other long term (current) drug therapy: Secondary | ICD-10-CM

## 2012-01-19 DIAGNOSIS — E039 Hypothyroidism, unspecified: Secondary | ICD-10-CM

## 2012-01-19 NOTE — Progress Notes (Signed)
Echocardiogram performed.  

## 2012-02-04 ENCOUNTER — Other Ambulatory Visit: Payer: Self-pay | Admitting: *Deleted

## 2012-02-10 ENCOUNTER — Encounter: Payer: Self-pay | Admitting: Cardiology

## 2012-02-14 ENCOUNTER — Other Ambulatory Visit: Payer: Self-pay | Admitting: Dermatology

## 2012-02-14 DIAGNOSIS — L820 Inflamed seborrheic keratosis: Secondary | ICD-10-CM | POA: Diagnosis not present

## 2012-02-14 DIAGNOSIS — D235 Other benign neoplasm of skin of trunk: Secondary | ICD-10-CM | POA: Diagnosis not present

## 2012-02-14 DIAGNOSIS — D485 Neoplasm of uncertain behavior of skin: Secondary | ICD-10-CM | POA: Diagnosis not present

## 2012-02-14 DIAGNOSIS — L578 Other skin changes due to chronic exposure to nonionizing radiation: Secondary | ICD-10-CM | POA: Diagnosis not present

## 2012-02-14 DIAGNOSIS — L570 Actinic keratosis: Secondary | ICD-10-CM | POA: Diagnosis not present

## 2012-02-14 DIAGNOSIS — L819 Disorder of pigmentation, unspecified: Secondary | ICD-10-CM | POA: Diagnosis not present

## 2012-02-17 ENCOUNTER — Other Ambulatory Visit: Payer: Self-pay

## 2012-02-17 MED ORDER — WARFARIN SODIUM 2 MG PO TABS: 2.0000 mg | ORAL_TABLET | ORAL | Status: DC

## 2012-02-18 ENCOUNTER — Other Ambulatory Visit: Payer: Self-pay | Admitting: Cardiology

## 2012-02-18 MED ORDER — AMIODARONE HCL 200 MG PO TABS: ORAL_TABLET | ORAL | Status: DC

## 2012-02-18 MED ORDER — WARFARIN SODIUM 2 MG PO TABS: ORAL_TABLET | ORAL | Status: DC

## 2012-02-18 MED ORDER — METOPROLOL SUCCINATE ER 50 MG PO TB24: ORAL_TABLET | ORAL | Status: DC

## 2012-02-18 MED ORDER — HYDROCHLOROTHIAZIDE 25 MG PO TABS: 25.0000 mg | ORAL_TABLET | Freq: Every day | ORAL | Status: DC

## 2012-03-06 ENCOUNTER — Ambulatory Visit (INDEPENDENT_AMBULATORY_CARE_PROVIDER_SITE_OTHER): Payer: Medicare Other | Admitting: *Deleted

## 2012-03-06 DIAGNOSIS — Z950 Presence of cardiac pacemaker: Secondary | ICD-10-CM

## 2012-03-06 LAB — PACEMAKER DEVICE OBSERVATION
BMOD-0004RA: 2
DEVICE MODEL PM: 567685

## 2012-03-06 NOTE — Progress Notes (Signed)
Patient presents for device clinic pacemaker check.  Pt reports problems with dizziness when climbing stairs quickly.  MV turned on today with a response factor of 3.  Accelerometer left at 8.  Question if we should increase max sensor rate now that AS has been surgically repaired.  Currently at 100bpm.  Pt will keep appt as scheduled with Dr [name] tomorrow.   Gypsy Balsam, RN, BSN 03/06/2012 11:20 AM

## 2012-03-07 ENCOUNTER — Encounter: Payer: Self-pay | Admitting: Internal Medicine

## 2012-03-07 ENCOUNTER — Ambulatory Visit (INDEPENDENT_AMBULATORY_CARE_PROVIDER_SITE_OTHER): Payer: Medicare Other | Admitting: Internal Medicine

## 2012-03-07 VITALS — BP 128/87 | HR 59 | Ht 74.0 in | Wt 227.0 lb

## 2012-03-07 DIAGNOSIS — I951 Orthostatic hypotension: Secondary | ICD-10-CM

## 2012-03-07 DIAGNOSIS — I4891 Unspecified atrial fibrillation: Secondary | ICD-10-CM

## 2012-03-07 DIAGNOSIS — I2510 Atherosclerotic heart disease of native coronary artery without angina pectoris: Secondary | ICD-10-CM

## 2012-03-07 NOTE — Progress Notes (Signed)
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 He is a 78 year old man with a history of aortic stenosis status post aortic valve replacement, hypertension, coronary artery disease, paroxysmal atrial fibrillation, and sinus bradycardia. The patient has complained of dizziness which is orthostatic in nature. He has had no frank syncope. When he bends over and comes back up, he feels dizzy and lightheaded. He can walk several miles at a time without difficulty. He can walk upstairs. Allergies  Allergen Reactions  . Contrast Media (Iodinated Diagnostic Agents) Hives     Current Outpatient Prescriptions  Medication Sig Dispense Refill  . amiodarone (PACERONE) 200 MG tablet one tablet by mouth daily  90 tablet  1  . aspirin 81 MG tablet Take 81 mg by mouth daily.       . bisacodyl (DULCOLAX) 5 MG EC tablet Take 5 mg by mouth daily as needed.      . Cholecalciferol (VITAMIN D PO) Take 2,000 Units by mouth daily.       . fish oil-omega-3 fatty acids 1000 MG capsule Take 1 g by mouth as needed.       . hydrochlorothiazide (HYDRODIURIL) 25 MG tablet Take 1 tablet (25 mg total) by mouth daily.  90 tablet  3  . HYDROcodone-acetaminophen (NORCO) 5-325 MG per tablet Take 1 tablet by mouth every 4 (four) hours as needed for pain. For pain  50 tablet  0  . levothyroxine (SYNTHROID, LEVOTHROID) 50 MCG tablet Take 50 mcg by mouth daily.        Marland Kitchen LORazepam (ATIVAN) 1 MG tablet Take one and one-half tablet by mouth at bedtime. For anxiety  45 tablet  0  . metoprolol succinate (TOPROL-XL) 50 MG 24 hr tablet Take 1/2 tablet (25 mg) by mouth daily  90 tablet  1  . potassium chloride (K-DUR,KLOR-CON) 10 MEQ tablet Take 1 tablet (10 mEq total) by mouth 2 (two) times daily.  60 tablet  6  . Tamsulosin HCl (FLOMAX) 0.4 MG CAPS Take 1 capsule (0.4 mg total) by mouth at bedtime every other day  1 capsule    . warfarin (COUMADIN) 2 MG tablet Take as directed by Anticoagulation clinic  120 tablet  1     Past Medical History   Diagnosis Date  . Chronotropic incompetence with sinus node dysfunction     Status post Guidant dual-mode, dual-pacing, dual-sensing  pacemaker   implantation now programmed to AAI with recent generator change.  . Coronary artery disease     status post multiple prior percutaneous coronary interventions, microvascular angina per Dr [name]  . Aortic stenosis     moderate aortic stenosis  . Hyperlipidemia   . Hypercoagulable state     chronically anticoagulated with coumadin  . Heart murmur   . Stroke     1990  . Hyperthyroidism   . Benign prostatic hypertrophy   . Arthritis   . Paroxysmal atrial fibrillation     DR. STuckey,   . Hypothyroidism     Dr. [name]    ROS:   All systems reviewed and negative except as noted in the HPI.   Past Surgical History  Procedure Date  . Pacemaker insertion 1991    Guidant PPM, most recent Generator Change by Dr [name] was 08/22/06  . Hemrrhoidectomy   . Cardiac catheterization     11  . Cardioversion   . Appendectomy   . Aortic valve replacement 03/15/2011    Procedure: AORTIC VALVE REPLACEMENT (AVR);  Surgeon: Alleen Borne, MD;  Location: [facility] OR;  Service: Open Heart Surgery;  Laterality: N/A;  . Maze 03/15/2011    Procedure: MAZE;  Surgeon: Alleen Borne, MD;  Location: [facility] OR;  Service: Open Heart Surgery;  Laterality: N/A;  . Tee without cardioversion 04/15/2011    Procedure: TRANSESOPHAGEAL ECHOCARDIOGRAM (TEE);  Surgeon: Marca Ancona, MD;  Location: [facility] ENDOSCOPY;  Service: Cardiovascular;  Laterality: N/A;  . Cardioversion 04/15/2011    Procedure: CARDIOVERSION;  Surgeon: Marca Ancona, MD;  Location: [facility] ENDOSCOPY;  Service: Cardiovascular;  Laterality: N/A;     Family History  Problem Relation Age of Onset  . Heart disease Brother     Twin brother has coronary disease and recent AVR for AS  . Anesthesia problems Neg Hx   . Hypotension Neg Hx   . Malignant hyperthermia Neg Hx   . Pseudochol deficiency Neg Hx       History   Social History  . Marital Status: Married    Spouse Name: N/A    Number of Children: N/A  . Years of Education: N/A   Occupational History  . Not on file.   Social History Main Topics  . Smoking status: Never Smoker   . Smokeless tobacco: Not on file  . Alcohol Use: No     Comment: occassional  . Drug Use: No  . Sexually Active: Not on file   Other Topics Concern  . Not on file   Social History Narrative  . No narrative on file     BP 128/87  Pulse 59  Ht 6\' 2"  (1.88 m)  Wt 227 lb (102.967 kg)  BMI 29.15 kg/m2  Physical Exam:  Well appearing 78 year old man, NAD HEENT: Unremarkable Neck:  No JVD, no thyromegally Lungs:  Clear with no wheezes, rales, or rhonchi. HEART:  Regular rate rhythm, no murmurs, no rubs, no clicks Abd:  soft, positive bowel sounds, no organomegally, no rebound, no guarding Ext:  2 plus pulses, no edema, no cyanosis, no clubbing Skin:  No rashes no nodules Neuro:  CN II through XII intact, motor grossly intact   DEVICE  Normal device function.  See PaceArt for details. No atrial fibrillation. 96% atrial pacing  Assess/Plan:

## 2012-03-07 NOTE — Assessment & Plan Note (Signed)
The patient is maintaining sinus rhythm very nicely on low-dose amiodarone therapy. He will continue his current medical therapy for atrial fibrillation. Of note, the patient is planning to go [place] skiing for several weeks. He has elected to stop his warfarin. He will start back once he stopped skiing.

## 2012-03-07 NOTE — Assessment & Plan Note (Signed)
Since his aortic valve has been replaced, he has had no additional anginal symptoms. No change in medical therapy.

## 2012-03-07 NOTE — Patient Instructions (Signed)
Your physician wants you to follow-up in:  6 months. You will receive a reminder letter in the mail two months in advance. If you don't receive a letter, please call our office to schedule the follow-up appointment.   

## 2012-03-07 NOTE — Assessment & Plan Note (Signed)
Today we discussed methods to minimize his symptoms. He has evidence of autonomic dysfunction. Maintaining a well-hydrated state, avoiding caffeine and alcohol, and moving a little bit slower when going from a recumbent to a standing position have all been discussed in detail. I do not think there is any pacemaker reprogramming option that will help reduce his symptoms.

## 2012-03-08 ENCOUNTER — Encounter: Payer: Self-pay | Admitting: Internal Medicine

## 2012-03-08 ENCOUNTER — Ambulatory Visit (INDEPENDENT_AMBULATORY_CARE_PROVIDER_SITE_OTHER): Payer: Medicare Other | Admitting: *Deleted

## 2012-03-08 DIAGNOSIS — I428 Other cardiomyopathies: Secondary | ICD-10-CM

## 2012-03-08 LAB — PACEMAKER DEVICE OBSERVATION

## 2012-03-09 ENCOUNTER — Encounter: Payer: Self-pay | Admitting: Internal Medicine

## 2012-04-03 ENCOUNTER — Other Ambulatory Visit: Payer: Self-pay | Admitting: Dermatology

## 2012-04-03 DIAGNOSIS — D485 Neoplasm of uncertain behavior of skin: Secondary | ICD-10-CM | POA: Diagnosis not present

## 2012-04-11 ENCOUNTER — Other Ambulatory Visit: Payer: Self-pay

## 2012-04-11 DIAGNOSIS — E7800 Pure hypercholesterolemia, unspecified: Secondary | ICD-10-CM

## 2012-04-11 MED ORDER — ATORVASTATIN CALCIUM 20 MG PO TABS: 20.0000 mg | ORAL_TABLET | Freq: Every day | ORAL | Status: DC

## 2012-04-27 DIAGNOSIS — M25519 Pain in unspecified shoulder: Secondary | ICD-10-CM | POA: Diagnosis not present

## 2012-04-27 DIAGNOSIS — M19049 Primary osteoarthritis, unspecified hand: Secondary | ICD-10-CM | POA: Diagnosis not present

## 2012-04-27 DIAGNOSIS — M25569 Pain in unspecified knee: Secondary | ICD-10-CM | POA: Diagnosis not present

## 2012-05-03 ENCOUNTER — Other Ambulatory Visit: Payer: Self-pay

## 2012-05-03 ENCOUNTER — Ambulatory Visit: Payer: Self-pay | Admitting: Internal Medicine

## 2012-05-03 ENCOUNTER — Ambulatory Visit (INDEPENDENT_AMBULATORY_CARE_PROVIDER_SITE_OTHER): Payer: Medicare Other | Admitting: *Deleted

## 2012-05-03 DIAGNOSIS — I4891 Unspecified atrial fibrillation: Secondary | ICD-10-CM

## 2012-05-03 DIAGNOSIS — Z7901 Long term (current) use of anticoagulants: Secondary | ICD-10-CM

## 2012-05-03 DIAGNOSIS — E039 Hypothyroidism, unspecified: Secondary | ICD-10-CM | POA: Diagnosis not present

## 2012-05-03 DIAGNOSIS — H02409 Unspecified ptosis of unspecified eyelid: Secondary | ICD-10-CM | POA: Diagnosis not present

## 2012-05-03 DIAGNOSIS — D6859 Other primary thrombophilia: Secondary | ICD-10-CM

## 2012-05-03 DIAGNOSIS — I10 Essential (primary) hypertension: Secondary | ICD-10-CM | POA: Diagnosis not present

## 2012-05-03 DIAGNOSIS — Z8679 Personal history of other diseases of the circulatory system: Secondary | ICD-10-CM | POA: Diagnosis not present

## 2012-05-03 DIAGNOSIS — Z954 Presence of other heart-valve replacement: Secondary | ICD-10-CM

## 2012-05-03 DIAGNOSIS — Z79899 Other long term (current) drug therapy: Secondary | ICD-10-CM

## 2012-05-03 DIAGNOSIS — I2510 Atherosclerotic heart disease of native coronary artery without angina pectoris: Secondary | ICD-10-CM

## 2012-05-03 LAB — BASIC METABOLIC PANEL
BUN: 18 mg/dL (ref 6–23)
CO2: 30 mEq/L (ref 19–32)
Chloride: 102 mEq/L (ref 96–112)
Potassium: 3.5 mEq/L (ref 3.5–5.1)

## 2012-05-03 LAB — CBC WITH DIFFERENTIAL/PLATELET
Basophils Relative: 0.8 % (ref 0.0–3.0)
Eosinophils Absolute: 0.2 10*3/uL (ref 0.0–0.7)
Eosinophils Relative: 4.8 % (ref 0.0–5.0)
HCT: 39.1 % (ref 39.0–52.0)
Lymphs Abs: 1.1 10*3/uL (ref 0.7–4.0)
MCHC: 33.7 g/dL (ref 30.0–36.0)
MCV: 85 fl (ref 78.0–100.0)
Monocytes Absolute: 0.4 10*3/uL (ref 0.1–1.0)
Neutro Abs: 3.4 10*3/uL (ref 1.4–7.7)
RBC: 4.6 Mil/uL (ref 4.22–5.81)
WBC: 5.1 10*3/uL (ref 4.5–10.5)

## 2012-05-03 LAB — T3, FREE: T3, Free: 2.6 pg/mL (ref 2.3–4.2)

## 2012-05-03 LAB — HEPATIC FUNCTION PANEL
Albumin: 4.1 g/dL (ref 3.5–5.2)
Bilirubin, Direct: 0.1 mg/dL (ref 0.0–0.3)
Total Protein: 7.4 g/dL (ref 6.0–8.3)

## 2012-05-03 LAB — SEDIMENTATION RATE: Sed Rate: 30 mm/hr — ABNORMAL HIGH (ref 0–22)

## 2012-05-03 LAB — PROTIME-INR
INR: 1.7 ratio — ABNORMAL HIGH (ref 0.8–1.0)
Prothrombin Time: 18.2 s — ABNORMAL HIGH (ref 10.2–12.4)

## 2012-05-03 LAB — T4, FREE: Free T4: 1 ng/dL (ref 0.60–1.60)

## 2012-05-03 LAB — LIPID PANEL
Cholesterol: 184 mg/dL (ref 0–200)
VLDL: 24 mg/dL (ref 0.0–40.0)

## 2012-05-03 LAB — HEMOGLOBIN A1C: Hgb A1c MFr Bld: 5.8 % (ref 4.6–6.5)

## 2012-05-03 NOTE — Patient Instructions (Signed)
Spoke with patient on phone regarding INR 1.7, see new episode for patient dosing.

## 2012-05-04 ENCOUNTER — Other Ambulatory Visit: Payer: Self-pay

## 2012-05-08 ENCOUNTER — Other Ambulatory Visit: Payer: Self-pay | Admitting: *Deleted

## 2012-05-08 MED ORDER — POTASSIUM CHLORIDE ER 10 MEQ PO TBCR: 10.0000 meq | EXTENDED_RELEASE_TABLET | Freq: Two times a day (BID) | ORAL | Status: DC

## 2012-05-09 ENCOUNTER — Other Ambulatory Visit: Payer: Self-pay | Admitting: Neurology

## 2012-05-09 DIAGNOSIS — H02409 Unspecified ptosis of unspecified eyelid: Secondary | ICD-10-CM

## 2012-05-17 ENCOUNTER — Ambulatory Visit
Admission: RE | Admit: 2012-05-17 | Discharge: 2012-05-17 | Disposition: A | Discharge status: Home / Self Care | Payer: Medicare Other | Source: Ambulatory Visit | Attending: Neurology | Admitting: Neurology

## 2012-05-17 DIAGNOSIS — H02409 Unspecified ptosis of unspecified eyelid: Secondary | ICD-10-CM

## 2012-05-17 DIAGNOSIS — I6509 Occlusion and stenosis of unspecified vertebral artery: Secondary | ICD-10-CM | POA: Diagnosis not present

## 2012-05-17 DIAGNOSIS — I658 Occlusion and stenosis of other precerebral arteries: Secondary | ICD-10-CM | POA: Diagnosis not present

## 2012-05-17 MED ORDER — IOHEXOL 350 MG/ML SOLN
100.0000 mL | Freq: Once | INTRAVENOUS | Status: AC | PRN
  Administered 2012-05-17: 100 mL via INTRAVENOUS

## 2012-06-05 ENCOUNTER — Other Ambulatory Visit: Payer: Self-pay

## 2012-06-05 MED ORDER — HYDROCHLOROTHIAZIDE 25 MG PO TABS: 25.0000 mg | ORAL_TABLET | Freq: Every day | ORAL | Status: DC

## 2012-06-05 MED ORDER — WARFARIN SODIUM 2 MG PO TABS: ORAL_TABLET | ORAL | Status: DC

## 2012-06-09 DIAGNOSIS — M2390 Unspecified internal derangement of unspecified knee: Secondary | ICD-10-CM | POA: Diagnosis not present

## 2012-06-12 ENCOUNTER — Encounter: Payer: Self-pay | Admitting: Cardiology

## 2012-06-12 ENCOUNTER — Ambulatory Visit (INDEPENDENT_AMBULATORY_CARE_PROVIDER_SITE_OTHER): Payer: Medicare Other

## 2012-06-12 ENCOUNTER — Ambulatory Visit (INDEPENDENT_AMBULATORY_CARE_PROVIDER_SITE_OTHER): Payer: Medicare Other | Admitting: Cardiology

## 2012-06-12 VITALS — BP 122/76 | HR 60 | Ht 73.0 in | Wt 220.0 lb

## 2012-06-12 DIAGNOSIS — I4891 Unspecified atrial fibrillation: Secondary | ICD-10-CM

## 2012-06-12 DIAGNOSIS — Z7901 Long term (current) use of anticoagulants: Secondary | ICD-10-CM

## 2012-06-12 DIAGNOSIS — I2510 Atherosclerotic heart disease of native coronary artery without angina pectoris: Secondary | ICD-10-CM

## 2012-06-12 DIAGNOSIS — D6859 Other primary thrombophilia: Secondary | ICD-10-CM | POA: Diagnosis not present

## 2012-06-12 DIAGNOSIS — Z954 Presence of other heart-valve replacement: Secondary | ICD-10-CM

## 2012-06-12 DIAGNOSIS — Z952 Presence of prosthetic heart valve: Secondary | ICD-10-CM

## 2012-06-12 DIAGNOSIS — E039 Hypothyroidism, unspecified: Secondary | ICD-10-CM

## 2012-06-12 LAB — POCT INR: INR: 1.8

## 2012-06-12 NOTE — Assessment & Plan Note (Signed)
Remains on warfarin at present.  Will continue given a fib.

## 2012-06-12 NOTE — Progress Notes (Signed)
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 He was able to ski this year without any difficulty whatsoever.  He really has some trouble event is over, but for the most part, his aortic valve replacement has been dramatic.   He did say he got my message regarding his labs, which do demonstrate mild TSH elevation, but he says he has been in contact with Dr. Leslie [name] regarding this and they had agreed to let him hover on the higher TSH side of things.  He also expressed a desire to see if in fact he could come down on his amiodarone dosing to 100mg  despite his recurrent arrhythmia, and we agreed that this would be acceptable. It is a pleasure providing his care.    Current Outpatient Prescriptions  Medication Sig Dispense Refill  . amiodarone (PACERONE) 200 MG tablet one tablet by mouth daily  90 tablet  1  . aspirin 81 MG tablet Take 81 mg by mouth daily.       . bisacodyl (DULCOLAX) 5 MG EC tablet Take 5 mg by mouth daily as needed.      . Coenzyme Q10 (COQ10) 100 MG CAPS Take 100 mg by mouth daily.      . fish oil-omega-3 fatty acids 1000 MG capsule Take 1 g by mouth as needed.       . hydrochlorothiazide (HYDRODIURIL) 25 MG tablet Take 1 tablet (25 mg total) by mouth daily.  90 tablet  3  . HYDROcodone-acetaminophen (NORCO) 5-325 MG per tablet Take 1 tablet by mouth every 4 (four) hours as needed for pain. For pain  50 tablet  0  . levothyroxine (SYNTHROID, LEVOTHROID) 50 MCG tablet Take 50 mcg by mouth daily.        Marland Kitchen LORazepam (ATIVAN) 1 MG tablet Take one and one-half tablet by mouth at bedtime. For anxiety  45 tablet  0  . metoprolol succinate (TOPROL-XL) 50 MG 24 hr tablet Take 1/2 tablet (25 mg) by mouth daily  90 tablet  1  . potassium chloride (K-DUR,KLOR-CON) 20 MEQ tablet Take 1 tablet (20 mEq total) by mouth daily.  1 tablet  0  . rosuvastatin (CRESTOR) 20 MG tablet Take 20 mg by mouth daily.      . Tamsulosin HCl (FLOMAX) 0.4 MG CAPS Take 1 capsule (0.4 mg total) by mouth at bedtime  every day  1 capsule    . warfarin (COUMADIN) 2 MG tablet Take as directed by Anticoagulation clinic  120 tablet  1   No current facility-administered medications for this visit.    Allergies  Allergen Reactions  . Contrast Media (Iodinated Diagnostic Agents) Hives    Past Medical History  Diagnosis Date  . Chronotropic incompetence with sinus node dysfunction     Status post Guidant dual-mode, dual-pacing, dual-sensing  pacemaker   implantation now programmed to AAI with recent generator change.  . Coronary artery disease     status post multiple prior percutaneous coronary interventions, microvascular angina per Dr [name]  . Aortic stenosis     moderate aortic stenosis  . Hyperlipidemia   . Hypercoagulable state     chronically anticoagulated with coumadin  . Heart murmur   . Stroke     1990  . Hyperthyroidism   . Benign prostatic hypertrophy   . Arthritis   . Paroxysmal atrial fibrillation     DR. Rollan [name],   . Hypothyroidism     Dr. Leslie [name]    Past Surgical History  Procedure Laterality Date  .  Pacemaker insertion  1991    Guidant PPM, most recent Generator Change by Dr [name] was 08/22/06  . Hemrrhoidectomy    . Cardiac catheterization      11  . Cardioversion    . Appendectomy    . Aortic valve replacement  03/15/2011    Procedure: AORTIC VALVE REPLACEMENT (AVR);  Surgeon: Kevin Borne, MD;  Location: [facility] OR;  Service: Open Heart Surgery;  Laterality: N/A;  . Maze  03/15/2011    Procedure: MAZE;  Surgeon: Kevin Borne, MD;  Location: [facility] OR;  Service: Open Heart Surgery;  Laterality: N/A;  . Tee without cardioversion  04/15/2011    Procedure: TRANSESOPHAGEAL ECHOCARDIOGRAM (TEE);  Surgeon: Kevin Ancona, MD;  Location: [facility] ENDOSCOPY;  Service: Cardiovascular;  Laterality: N/A;  . Cardioversion  04/15/2011    Procedure: CARDIOVERSION;  Surgeon: Kevin Ancona, MD;  Location: [facility] ENDOSCOPY;  Service: Cardiovascular;  Laterality: N/A;    Family History  Problem  Relation Age of Onset  . Heart disease Brother     Twin brother has coronary disease and recent AVR for AS  . Anesthesia problems Neg Hx   . Hypotension Neg Hx   . Malignant hyperthermia Neg Hx   . Pseudochol deficiency Neg Hx     History   Social History  . Marital Status: Married    Spouse Name: N/A    Number of Children: N/A  . Years of Education: N/A   Occupational History  . Not on file.   Social History Main Topics  . Smoking status: Never Smoker   . Smokeless tobacco: Not on file  . Alcohol Use: No     Comment: occassional  . Drug Use: No  . Sexually Active: Not on file   Other Topics Concern  . Not on file   Social History Narrative  . No narrative on file    ROS: Please see the HPI.  All other systems reviewed and negative.  PHYSICAL EXAM:  BP 122/76  Pulse 60  Ht 6\' 1"  (1.854 m)  Wt 220 lb (99.791 kg)  BMI 29.03 kg/m2  General: Well developed, well nourished, in no acute distress. Head:  Normocephalic and atraumatic. Neck: no JVD Lungs: Clear to auscultation and percussion. Heart: Normal S1 and S2. Miniimal SEM.  No DM>   Abdomen:  Normal bowel sounds; soft; non tender; no organomegaly Pulses: Pulses normal in all 4 extremities. Extremities: No clubbing or cyanosis. No edema. Neurologic: Alert and oriented x 3.  EKG:  Atrial paced with prolonged AV interval.  RBBB.  Left axis deviation. No change from Dec 01 2011.    ASSESSMENT AND PLAN:  1.  SP AVR --  Stable with dramatic improvement in symptoms.  2.  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  Recurrent atrial fib on Amiodarone 4.  History of syndrome X with classic angina with normal coronary arteries at that time and marked pos GXT  (1985 documentation) 5.  CAD sp stenting for multivessel coronary abnormalities--these have been stable.  6.  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  Chronic warfarin anticoagulation for #3, #6.  8.   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  DJD 10. Hypothyroidism followed by Dr. Leslie [name]  Feel free to call me at any time as I know the specifics of his history in detail along with that of his twin brother and prior evaluations.

## 2012-06-12 NOTE — Assessment & Plan Note (Signed)
Remains clinically euthyroid, but TSH is up a bit.  He has follow up and discussion with Dr. [name].

## 2012-06-12 NOTE — Assessment & Plan Note (Signed)
Continue to monitor over time.

## 2012-06-12 NOTE — Patient Instructions (Addendum)
Your physician wants you to follow-up in:  August 2014 with Dr. Excell Seltzer. You will receive a reminder letter in the mail two months in advance. If you don't receive a letter, please call our office to schedule the follow-up appointment.

## 2012-06-12 NOTE — Assessment & Plan Note (Signed)
Discussed options with regard to treatment.  He would like to come down on amiodarone to 100mg  per day and see how he does.  I would support for now.

## 2012-06-12 NOTE — Assessment & Plan Note (Signed)
Stable status.  Minimal murmur.  Good exercise tolerance.  Continue exercise at present.

## 2012-06-20 ENCOUNTER — Other Ambulatory Visit: Payer: Self-pay | Admitting: *Deleted

## 2012-06-20 MED ORDER — POTASSIUM CHLORIDE CRYS ER 20 MEQ PO TBCR: 20.0000 meq | EXTENDED_RELEASE_TABLET | Freq: Every day | ORAL | Status: DC

## 2012-06-23 ENCOUNTER — Ambulatory Visit (INDEPENDENT_AMBULATORY_CARE_PROVIDER_SITE_OTHER): Payer: Medicare Other | Admitting: *Deleted

## 2012-06-23 DIAGNOSIS — I4891 Unspecified atrial fibrillation: Secondary | ICD-10-CM

## 2012-06-23 DIAGNOSIS — Z954 Presence of other heart-valve replacement: Secondary | ICD-10-CM | POA: Diagnosis not present

## 2012-06-23 DIAGNOSIS — Z7901 Long term (current) use of anticoagulants: Secondary | ICD-10-CM | POA: Diagnosis not present

## 2012-06-30 ENCOUNTER — Ambulatory Visit (INDEPENDENT_AMBULATORY_CARE_PROVIDER_SITE_OTHER): Payer: Medicare Other | Admitting: *Deleted

## 2012-06-30 DIAGNOSIS — I4891 Unspecified atrial fibrillation: Secondary | ICD-10-CM

## 2012-06-30 DIAGNOSIS — Z7901 Long term (current) use of anticoagulants: Secondary | ICD-10-CM | POA: Diagnosis not present

## 2012-06-30 DIAGNOSIS — Z954 Presence of other heart-valve replacement: Secondary | ICD-10-CM | POA: Diagnosis not present

## 2012-07-03 ENCOUNTER — Telehealth: Payer: Self-pay | Admitting: Cardiovascular Disease

## 2012-07-03 NOTE — Telephone Encounter (Signed)
New problem    3 months of K+ 20 meq one bid.

## 2012-07-04 DIAGNOSIS — G7000 Myasthenia gravis without (acute) exacerbation: Secondary | ICD-10-CM | POA: Diagnosis not present

## 2012-07-04 MED ORDER — POTASSIUM CHLORIDE CRYS ER 20 MEQ PO TBCR: 20.0000 meq | EXTENDED_RELEASE_TABLET | Freq: Every day | ORAL | Status: DC

## 2012-07-11 ENCOUNTER — Telehealth: Payer: Self-pay | Admitting: Neurology

## 2012-07-11 NOTE — Telephone Encounter (Signed)
Received report for EMG from [facility].  Copy in your in box.

## 2012-07-11 NOTE — Telephone Encounter (Signed)
The patient is already on Mestinon.

## 2012-07-11 NOTE — Telephone Encounter (Signed)
I received the report. The single fiber EMG was consistent with myasthenia gravis. The patient was seen initially for ptosis.

## 2012-07-14 ENCOUNTER — Ambulatory Visit (INDEPENDENT_AMBULATORY_CARE_PROVIDER_SITE_OTHER): Payer: Medicare Other

## 2012-07-14 DIAGNOSIS — I4891 Unspecified atrial fibrillation: Secondary | ICD-10-CM

## 2012-07-14 DIAGNOSIS — Z7901 Long term (current) use of anticoagulants: Secondary | ICD-10-CM

## 2012-07-14 DIAGNOSIS — Z954 Presence of other heart-valve replacement: Secondary | ICD-10-CM

## 2012-08-01 ENCOUNTER — Encounter: Payer: Self-pay | Admitting: Internal Medicine

## 2012-08-01 ENCOUNTER — Ambulatory Visit (INDEPENDENT_AMBULATORY_CARE_PROVIDER_SITE_OTHER): Payer: Medicare Other

## 2012-08-01 ENCOUNTER — Ambulatory Visit (INDEPENDENT_AMBULATORY_CARE_PROVIDER_SITE_OTHER): Payer: Medicare Other | Admitting: Internal Medicine

## 2012-08-01 VITALS — BP 130/84 | HR 87

## 2012-08-01 DIAGNOSIS — I209 Angina pectoris, unspecified: Secondary | ICD-10-CM

## 2012-08-01 DIAGNOSIS — I4891 Unspecified atrial fibrillation: Secondary | ICD-10-CM

## 2012-08-01 DIAGNOSIS — I2510 Atherosclerotic heart disease of native coronary artery without angina pectoris: Secondary | ICD-10-CM

## 2012-08-01 DIAGNOSIS — Z954 Presence of other heart-valve replacement: Secondary | ICD-10-CM

## 2012-08-01 DIAGNOSIS — Z7901 Long term (current) use of anticoagulants: Secondary | ICD-10-CM

## 2012-08-01 DIAGNOSIS — I495 Sick sinus syndrome: Secondary | ICD-10-CM | POA: Diagnosis not present

## 2012-08-01 DIAGNOSIS — I951 Orthostatic hypotension: Secondary | ICD-10-CM

## 2012-08-01 DIAGNOSIS — I498 Other specified cardiac arrhythmias: Secondary | ICD-10-CM

## 2012-08-01 LAB — PACEMAKER DEVICE OBSERVATION
AL AMPLITUDE: 1.5 mv
ATRIAL PACING PM: 96
BMOD-0002RA: 8
BRDY-0004RA: 100 {beats}/min
DEVICE MODEL PM: 567685

## 2012-08-01 NOTE — Assessment & Plan Note (Signed)
Since his valve replacement, he has not had angina.

## 2012-08-01 NOTE — Assessment & Plan Note (Signed)
He is maintaining sinus rhythm. No change in medical therapy. 

## 2012-08-01 NOTE — Assessment & Plan Note (Signed)
His anginal symptoms are well-controlled. No change in medical therapy.

## 2012-08-01 NOTE — Patient Instructions (Addendum)
Your physician wants you to follow-up in: 6 months with Dr Court Joy will receive a reminder letter in the mail two months in advance. If you don't receive a letter, please call our office to schedule the follow-up appointment.  Your physician has recommended you make the following change in your medication:  1) STOP HCTZ

## 2012-08-01 NOTE — Assessment & Plan Note (Signed)
He is orthostatic symptoms by history. I've asked the patient to stop taking his hydrochlorothiazide, and to slowly get up.

## 2012-08-01 NOTE — Progress Notes (Signed)
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 Since his surgery, he has done well except that he notes shortness of breath when he bends over and when he climbs up stairs. In addition the patient has periods of orthostasis, particularly when he gets up too quickly. He is on a diuretic. Allergies  Allergen Reactions  . Contrast Media (Iodinated Diagnostic Agents) Hives     Current Outpatient Prescriptions  Medication Sig Dispense Refill  . amiodarone (PACERONE) 200 MG tablet one tablet by mouth daily  90 tablet  1  . aspirin 81 MG tablet Take 81 mg by mouth daily.       . bisacodyl (DULCOLAX) 5 MG EC tablet Take 5 mg by mouth daily as needed.      . Coenzyme Q10 (COQ10) 100 MG CAPS Take 100 mg by mouth daily.      . fish oil-omega-3 fatty acids 1000 MG capsule Take 1 g by mouth as needed.       Marland Kitchen HYDROcodone-acetaminophen (NORCO) 5-325 MG per tablet Take 1 tablet by mouth every 4 (four) hours as needed for pain. For pain  50 tablet  0  . levothyroxine (SYNTHROID, LEVOTHROID) 50 MCG tablet Take 50 mcg by mouth daily.        Marland Kitchen LORazepam (ATIVAN) 1 MG tablet Take one and one-half tablet by mouth at bedtime. For anxiety  45 tablet  0  . metoprolol succinate (TOPROL-XL) 50 MG 24 hr tablet Take 1/2 tablet (25 mg) by mouth daily  90 tablet  1  . potassium chloride SA (K-DUR,KLOR-CON) 20 MEQ tablet Take 1 tablet (20 mEq total) by mouth daily.  90 tablet  3  . rosuvastatin (CRESTOR) 20 MG tablet Take 20 mg by mouth daily.      . Tamsulosin HCl (FLOMAX) 0.4 MG CAPS Take 1 capsule (0.4 mg total) by mouth at bedtime every day  1 capsule    . warfarin (COUMADIN) 2 MG tablet Take as directed by Anticoagulation clinic  120 tablet  1   No current facility-administered medications for this visit.     Past Medical History  Diagnosis Date  . Chronotropic  incompetence with sinus node dysfunction     Status post Guidant dual-mode, dual-pacing, dual-sensing  pacemaker   implantation now programmed to AAI with recent generator change.  . Coronary artery disease     status post multiple prior percutaneous coronary interventions, microvascular angina per Dr [name]  . Aortic stenosis     moderate aortic stenosis  . Hyperlipidemia   . Hypercoagulable state     chronically anticoagulated with coumadin  . Heart murmur   . Stroke     1990  . Hyperthyroidism   . Benign prostatic hypertrophy   . Arthritis   . Paroxysmal atrial fibrillation     DR. STuckey,   . Hypothyroidism     Dr. [name]    ROS:   All systems reviewed and negative except as noted in the HPI.   Past Surgical History  Procedure Laterality Date  . Pacemaker insertion  1991    Guidant PPM, most recent Generator Change by Dr [name] was 08/22/06  . Hemrrhoidectomy    . Cardiac catheterization      11  . Cardioversion    . Appendectomy    . Aortic valve replacement  03/15/2011    Procedure: AORTIC VALVE REPLACEMENT (AVR);  Surgeon: Alleen Borne, MD;  Location: [facility] OR;  Service: Open Heart Surgery;  Laterality: N/A;  . Maze  03/15/2011    Procedure: MAZE;  Surgeon: Alleen Borne, MD;  Location: [facility] OR;  Service: Open Heart Surgery;  Laterality: N/A;  . Tee without cardioversion  04/15/2011    Procedure: TRANSESOPHAGEAL ECHOCARDIOGRAM (TEE);  Surgeon: Marca Ancona, MD;  Location: [facility] ENDOSCOPY;  Service: Cardiovascular;  Laterality: N/A;  . Cardioversion  04/15/2011    Procedure: CARDIOVERSION;  Surgeon: Marca Ancona, MD;  Location: [facility] ENDOSCOPY;  Service: Cardiovascular;  Laterality: N/A;     Family History  Problem Relation Age of Onset  . Heart disease Brother     Twin brother has coronary disease and recent AVR for AS  . Anesthesia problems Neg Hx   . Hypotension Neg Hx   . Malignant hyperthermia Neg Hx   . Pseudochol deficiency Neg Hx      History   Social  History  . Marital Status: Married    Spouse Name: N/A    Number of Children: N/A  . Years of Education: N/A   Occupational History  . Not on file.   Social History Main Topics  . Smoking status: Never Smoker   . Smokeless tobacco: Not on file  . Alcohol Use: No     Comment: occassional  . Drug Use: No  . Sexually Active: Not on file   Other Topics Concern  . Not on file   Social History Narrative  . No narrative on file     BP 130/84  Pulse 87  Physical Exam:  Well appearing 78 year old man, NAD HEENT: Unremarkable Neck:  6 cm JVD, no thyromegally Back:  No CVA tenderness Lungs:  Clear with no wheezes, rales, or rhonchi. HEART:  Regular rate rhythm, no murmurs, no rubs, no clicks Abd:  soft, positive bowel sounds, no organomegally, no rebound, no guarding Ext:  2 plus pulses, no edema, no cyanosis, no clubbing Skin:  No rashes no nodules Neuro:  CN II through XII intact, motor grossly intact  DEVICE  Normal device function.  See PaceArt for details.   Assess/Plan:

## 2012-08-18 ENCOUNTER — Other Ambulatory Visit: Payer: Self-pay

## 2012-08-18 DIAGNOSIS — Z954 Presence of other heart-valve replacement: Secondary | ICD-10-CM

## 2012-08-18 DIAGNOSIS — I4891 Unspecified atrial fibrillation: Secondary | ICD-10-CM

## 2012-08-18 DIAGNOSIS — Z7901 Long term (current) use of anticoagulants: Secondary | ICD-10-CM

## 2012-08-18 MED ORDER — NITROGLYCERIN 0.4 MG SL SUBL: 0.4000 mg | SUBLINGUAL_TABLET | SUBLINGUAL | Status: DC | PRN

## 2012-09-08 ENCOUNTER — Ambulatory Visit (INDEPENDENT_AMBULATORY_CARE_PROVIDER_SITE_OTHER): Payer: Medicare Other | Admitting: *Deleted

## 2012-09-08 DIAGNOSIS — Z7901 Long term (current) use of anticoagulants: Secondary | ICD-10-CM | POA: Diagnosis not present

## 2012-09-08 DIAGNOSIS — I4891 Unspecified atrial fibrillation: Secondary | ICD-10-CM

## 2012-09-08 DIAGNOSIS — Z954 Presence of other heart-valve replacement: Secondary | ICD-10-CM

## 2012-09-15 ENCOUNTER — Ambulatory Visit (INDEPENDENT_AMBULATORY_CARE_PROVIDER_SITE_OTHER): Payer: Medicare Other | Admitting: Pharmacist

## 2012-09-15 DIAGNOSIS — Z7901 Long term (current) use of anticoagulants: Secondary | ICD-10-CM | POA: Diagnosis not present

## 2012-09-15 DIAGNOSIS — I4891 Unspecified atrial fibrillation: Secondary | ICD-10-CM

## 2012-09-15 DIAGNOSIS — Z954 Presence of other heart-valve replacement: Secondary | ICD-10-CM | POA: Diagnosis not present

## 2012-09-20 ENCOUNTER — Other Ambulatory Visit: Payer: Self-pay

## 2012-09-20 DIAGNOSIS — R609 Edema, unspecified: Secondary | ICD-10-CM

## 2012-09-20 DIAGNOSIS — M79604 Pain in right leg: Secondary | ICD-10-CM

## 2012-09-21 ENCOUNTER — Encounter (INDEPENDENT_AMBULATORY_CARE_PROVIDER_SITE_OTHER): Payer: Medicare Other

## 2012-09-21 ENCOUNTER — Ambulatory Visit (INDEPENDENT_AMBULATORY_CARE_PROVIDER_SITE_OTHER): Payer: Medicare Other | Admitting: Pharmacist

## 2012-09-21 ENCOUNTER — Other Ambulatory Visit: Payer: Self-pay | Admitting: Pharmacist

## 2012-09-21 DIAGNOSIS — Z7901 Long term (current) use of anticoagulants: Secondary | ICD-10-CM

## 2012-09-21 DIAGNOSIS — I4891 Unspecified atrial fibrillation: Secondary | ICD-10-CM

## 2012-09-21 DIAGNOSIS — M79604 Pain in right leg: Secondary | ICD-10-CM

## 2012-09-21 DIAGNOSIS — R609 Edema, unspecified: Secondary | ICD-10-CM

## 2012-09-21 DIAGNOSIS — Z954 Presence of other heart-valve replacement: Secondary | ICD-10-CM

## 2012-09-21 DIAGNOSIS — M7989 Other specified soft tissue disorders: Secondary | ICD-10-CM | POA: Diagnosis not present

## 2012-09-21 DIAGNOSIS — M79609 Pain in unspecified limb: Secondary | ICD-10-CM

## 2012-09-21 LAB — POCT INR: INR: 2.6

## 2012-09-21 MED ORDER — WARFARIN SODIUM 4 MG PO TABS: ORAL_TABLET | ORAL | Status: DC

## 2012-09-25 ENCOUNTER — Telehealth: Payer: Self-pay | Admitting: Cardiovascular Disease

## 2012-09-25 NOTE — Telephone Encounter (Signed)
LMOM for [place] with [employer]

## 2012-09-25 NOTE — Telephone Encounter (Signed)
Will forward to CVRR. 

## 2012-09-25 NOTE — Telephone Encounter (Signed)
New Prob     Calling to provide an update on order for at home [facility] meter. Please call.

## 2012-09-25 NOTE — Telephone Encounter (Signed)
Spoke with Channing Mutters.  They have received all of the paperwork and are in the process of verifying benefits.  Once this is done, pt will be trained on the meter.

## 2012-10-01 ENCOUNTER — Other Ambulatory Visit: Payer: Self-pay | Admitting: Cardiovascular Disease

## 2012-10-01 MED ORDER — WARFARIN SODIUM 4 MG PO TABS: ORAL_TABLET | ORAL | Status: DC

## 2012-10-01 NOTE — Progress Notes (Signed)
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  Request a new script for the week.  Prescribed coumadin 4 mg tabs, # 15 to [facility], [facility] [place], [place]) Phone [phone]  Vesta Mixer, Montez Hageman., MD, [facility] 10/01/2012, 11:08 AM Office - [phone] Pager [phone]

## 2012-10-09 DIAGNOSIS — G7000 Myasthenia gravis without (acute) exacerbation: Secondary | ICD-10-CM | POA: Diagnosis not present

## 2012-10-11 ENCOUNTER — Other Ambulatory Visit: Payer: Self-pay | Admitting: *Deleted

## 2012-10-11 ENCOUNTER — Ambulatory Visit (INDEPENDENT_AMBULATORY_CARE_PROVIDER_SITE_OTHER): Payer: Medicare Other | Admitting: Cardiovascular Disease

## 2012-10-11 ENCOUNTER — Encounter: Payer: Self-pay | Admitting: Cardiovascular Disease

## 2012-10-11 VITALS — BP 116/74 | HR 60 | Ht 74.0 in | Wt 225.0 lb

## 2012-10-11 DIAGNOSIS — R0602 Shortness of breath: Secondary | ICD-10-CM | POA: Diagnosis not present

## 2012-10-11 DIAGNOSIS — I2510 Atherosclerotic heart disease of native coronary artery without angina pectoris: Secondary | ICD-10-CM

## 2012-10-11 DIAGNOSIS — R7309 Other abnormal glucose: Secondary | ICD-10-CM | POA: Diagnosis not present

## 2012-10-11 DIAGNOSIS — I4891 Unspecified atrial fibrillation: Secondary | ICD-10-CM

## 2012-10-11 NOTE — Progress Notes (Signed)
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 He is a 78 year old gentleman with history of coronary artery disease, cardiac syndrome X., aortic valve stenosis, and atrial fibrillation. He underwent aortic valve replacement with a 25 mm Edwards pericardial bioprosthesis and left-sided radiofrequency cryo- Cox maze procedure with ligation of the left atrial appendage in January 2013. He's had recurrence of atrial fibrillation and required amiodarone.  He called in last month with painless unilateral leg swelling on the right. A DVT ultrasound study was done and was negative. The swelling has now resolved.  Lipids were last checked in March 2014 and showed cholesterol 184, HDL 63, LDL 97, and triglycerides 161. Chemistry studies have been normal. Most recent hemoglobin from March was 13.2.  The patient has a history of symptomatic bradycardia and has undergone dual-chamber pacing by Dr. [name]. He was last seen in June.  Overall he is doing well. He continues to have shortness of breath with certain activities like walking up stairs at a brisk pace or bending forward. When he walks slowly he has no problems. He denies chest pain or pressure, orthopnea, PND, or cough. He has no lightheadedness or palpitations.  Outpatient Encounter Prescriptions as of 10/11/2012  Medication Sig Dispense Refill  . amiodarone (PACERONE) 200 MG tablet 100 mg. one tablet by mouth daily      . aspirin 81 MG tablet Take 81 mg by mouth daily.       . bisacodyl (DULCOLAX) 5 MG EC tablet Take 5 mg by mouth daily as needed.      . Coenzyme Q10 (COQ10) 100 MG CAPS Take 100 mg by mouth daily.      . fish oil-omega-3 fatty acids 1000 MG capsule Take 1 g by mouth as needed.       . hydrochlorothiazide (HYDRODIURIL) 25 MG tablet Take 25 mg by mouth daily.      Marland Kitchen HYDROcodone-acetaminophen (NORCO) 5-325 MG per tablet Take 1 tablet by mouth every 4 (four) hours as needed for pain. For pain  50 tablet  0  . levothyroxine  (SYNTHROID, LEVOTHROID) 50 MCG tablet Take 50 mcg by mouth daily.        Marland Kitchen LORazepam (ATIVAN) 1 MG tablet Take one and one-half tablet by mouth at bedtime. For anxiety  45 tablet  0  . metoprolol succinate (TOPROL-XL) 50 MG 24 hr tablet Take 1/2 tablet (25 mg) by mouth daily  90 tablet  1  . nitroGLYCERIN (NITROSTAT) 0.4 MG SL tablet Place 1 tablet (0.4 mg total) under the tongue every 5 (five) minutes as needed for chest pain.  25 tablet  3  . potassium chloride SA (K-DUR,KLOR-CON) 20 MEQ tablet Take 1 tablet (20 mEq total) by mouth daily.  90 tablet  3  . rosuvastatin (CRESTOR) 20 MG tablet Take 20 mg by mouth daily.      . Tamsulosin HCl (FLOMAX) 0.4 MG CAPS Take 1 capsule (0.4 mg total) by mouth at bedtime every day  1 capsule    . warfarin (COUMADIN) 4 MG tablet Take as directed by Anticoagulation clinic  15 tablet  1  . [DISCONTINUED] amiodarone (PACERONE) 200 MG tablet one tablet by mouth daily  90 tablet  1   No facility-administered encounter medications on file as of 10/11/2012.    Allergies  Allergen Reactions  . Contrast Media [Iodinated Diagnostic Agents] Hives    Past Medical History  Diagnosis Date  . Chronotropic incompetence with sinus node dysfunction     Status post Guidant  dual-mode, dual-pacing, dual-sensing  pacemaker   implantation now programmed to AAI with recent generator change.  . Coronary artery disease     status post multiple prior percutaneous coronary interventions, microvascular angina per Dr [name]  . Aortic stenosis     moderate aortic stenosis  . Hyperlipidemia   . Hypercoagulable state     chronically anticoagulated with coumadin  . Heart murmur   . Stroke     1990  . Hyperthyroidism   . Benign prostatic hypertrophy   . Arthritis   . Paroxysmal atrial fibrillation     DR. STuckey,   . Hypothyroidism     Dr. [name]   ROS: Negative except as per HPI  BP 116/74  Pulse 60  Ht 6\' 2"  (1.88 m)  Wt 102.059 kg (225 lb)  BMI 28.88  kg/m2  PHYSICAL EXAM: Pt is alert and oriented, NAD HEENT: normal Neck: JVP - normal, carotids 2+= without bruits Lungs: CTA bilaterally CV: RRR without murmur or gallop Abd: soft, NT, Positive BS, no hepatomegaly Ext: Mild right leg edema, distal pulses intact and equal Skin: warm/dry no rash  2-D echocardiogram 01/19/2012: Left ventricle: The cavity size was mildly dilated. Wall thickness was increased in a pattern of mild LVH. Systolic function was normal. The estimated ejection fraction was in the range of 55% to 60%. Wall motion was normal; there were no regional wall motion abnormalities. Features are consistent with a pseudonormal left ventricular filling pattern, with concomitant abnormal relaxation and increased filling pressure (grade 2 diastolic dysfunction).  ------------------------------------------------------------ Aortic valve: A bioprosthesis was present. Doppler: No regurgitation. VTI ratio of LVOT to aortic valve: 0.45. Peak velocity ratio of LVOT to aortic valve: 0.43. Mean gradient: 9mm Hg (S). Peak gradient: 18mm Hg (S).  ------------------------------------------------------------ Aorta: Aortic root: The aortic root was normal in size.  ------------------------------------------------------------ Mitral valve: Calcified annulus. Mobility was not restricted. Doppler: Transvalvular velocity was within the normal range. There was no evidence for stenosis. Mild regurgitation. Peak gradient: 3mm Hg (D).  ------------------------------------------------------------ Left atrium: The atrium was moderately dilated.  ------------------------------------------------------------ Right ventricle: The cavity size was normal. Pacer wire or catheter noted in right ventricle. Systolic function was mildly reduced.  ------------------------------------------------------------ Pulmonic valve: Doppler: Transvalvular velocity was within the normal range. There was no  evidence for stenosis.  ------------------------------------------------------------ Tricuspid valve: Structurally normal valve. Doppler: Transvalvular velocity was within the normal range. Trivial regurgitation.  ------------------------------------------------------------ Right atrium: The atrium was normal in size. Pacer wire or catheter noted in right atrium.  ------------------------------------------------------------ Pericardium: There was no pericardial effusion.  ------------------------------------------------------------ Systemic veins: Inferior vena cava: The vessel was normal in size.  ASSESSMENT AND PLAN: 1. Aortic stenosis. He is status post bioprosthetic aortic valve replacement. Marked symptomatic improvement since surgery. Continue clinical followup-will see him back in 6 months  2. Coronary artery disease, native vessel. Stable without anginal symptoms  3. Symptomatic bradycardia. Status post permanent pacemaker followed by Dr. [name]  4. Hyperlipidemia. Lipids reviewed as above. The patient remains on Crestor 20 mg daily  5. Chronic exertional dyspnea. Continues to be a significant complaint. Will check a cardiopulmonary exercise test for further evaluation.  Tonny Bollman 10/16/2012 5:46 AM

## 2012-10-11 NOTE — Patient Instructions (Addendum)
Your physician has recommended that you have a cardiopulmonary stress test (CPX). CPX testing is a non-invasive measurement of heart and lung function. It replaces a traditional treadmill stress test. This type of test provides a tremendous amount of information that relates not only to your present condition but also for future outcomes. This test combines measurements of you ventilation, respiratory gas exchange in the lungs, electrocardiogram (EKG), blood pressure and physical response before, during, and following an exercise protocol.  Your physician recommends that you return for lab work (FASTING): Lipid, Liver, BMP, CBC, TSH, HgbA1c, Free T4, Free T3, INR, Urine Drug Screen (per Dr Corliss Skains)  Your physician wants you to follow-up in: 6 MONTHS with Dr Excell Seltzer.  You will receive a reminder letter in the mail two months in advance. If you don't receive a letter, please call our office to schedule the follow-up appointment.

## 2012-10-16 ENCOUNTER — Ambulatory Visit (HOSPITAL_COMMUNITY): Payer: Medicare Other | Attending: Internal Medicine

## 2012-10-16 ENCOUNTER — Other Ambulatory Visit (INDEPENDENT_AMBULATORY_CARE_PROVIDER_SITE_OTHER): Payer: Medicare Other

## 2012-10-16 ENCOUNTER — Encounter: Payer: Self-pay | Admitting: Cardiovascular Disease

## 2012-10-16 DIAGNOSIS — R0609 Other forms of dyspnea: Secondary | ICD-10-CM

## 2012-10-16 DIAGNOSIS — I2510 Atherosclerotic heart disease of native coronary artery without angina pectoris: Secondary | ICD-10-CM | POA: Diagnosis not present

## 2012-10-16 DIAGNOSIS — I4891 Unspecified atrial fibrillation: Secondary | ICD-10-CM

## 2012-10-16 DIAGNOSIS — R7309 Other abnormal glucose: Secondary | ICD-10-CM | POA: Diagnosis not present

## 2012-10-16 DIAGNOSIS — R0602 Shortness of breath: Secondary | ICD-10-CM | POA: Insufficient documentation

## 2012-10-16 DIAGNOSIS — R0989 Other specified symptoms and signs involving the circulatory and respiratory systems: Secondary | ICD-10-CM | POA: Diagnosis not present

## 2012-10-16 DIAGNOSIS — Z79899 Other long term (current) drug therapy: Secondary | ICD-10-CM | POA: Diagnosis not present

## 2012-10-16 LAB — HEPATIC FUNCTION PANEL
Albumin: 4.2 g/dL (ref 3.5–5.2)
Total Bilirubin: 0.6 mg/dL (ref 0.3–1.2)

## 2012-10-16 LAB — CBC WITH DIFFERENTIAL/PLATELET
Basophils Absolute: 0 10*3/uL (ref 0.0–0.1)
Eosinophils Absolute: 0.3 10*3/uL (ref 0.0–0.7)
Hemoglobin: 13.1 g/dL (ref 13.0–17.0)
Lymphocytes Relative: 17.3 % (ref 12.0–46.0)
Lymphs Abs: 1 10*3/uL (ref 0.7–4.0)
MCHC: 34.5 g/dL (ref 30.0–36.0)
MCV: 84.9 fl (ref 78.0–100.0)
Monocytes Absolute: 0.4 10*3/uL (ref 0.1–1.0)
Neutro Abs: 3.9 10*3/uL (ref 1.4–7.7)
RDW: 13.4 % (ref 11.5–14.6)

## 2012-10-16 LAB — LIPID PANEL
HDL: 66.7 mg/dL (ref >39.00)
Total CHOL/HDL Ratio: 3
Triglycerides: 100 mg/dL (ref 0.0–149.0)
VLDL: 20 mg/dL (ref 0.0–40.0)

## 2012-10-16 LAB — BASIC METABOLIC PANEL
BUN: 15 mg/dL (ref 6–23)
Calcium: 9.2 mg/dL (ref 8.4–10.5)
Creatinine, Ser: 1 mg/dL (ref 0.4–1.5)
GFR: 74.6 mL/min (ref >60.00)

## 2012-10-16 LAB — PROTIME-INR
INR: 2.2 ratio — ABNORMAL HIGH (ref 0.8–1.0)
Prothrombin Time: 23.1 s — ABNORMAL HIGH (ref 10.2–12.4)

## 2012-10-17 DIAGNOSIS — D6859 Other primary thrombophilia: Secondary | ICD-10-CM | POA: Diagnosis not present

## 2012-10-17 LAB — POCT INR: INR: 2

## 2012-10-18 ENCOUNTER — Ambulatory Visit (INDEPENDENT_AMBULATORY_CARE_PROVIDER_SITE_OTHER): Payer: Medicare Other | Admitting: Cardiovascular Disease

## 2012-10-18 DIAGNOSIS — Z954 Presence of other heart-valve replacement: Secondary | ICD-10-CM

## 2012-10-18 DIAGNOSIS — I4891 Unspecified atrial fibrillation: Secondary | ICD-10-CM

## 2012-10-18 DIAGNOSIS — Z7901 Long term (current) use of anticoagulants: Secondary | ICD-10-CM

## 2012-10-19 DIAGNOSIS — L570 Actinic keratosis: Secondary | ICD-10-CM | POA: Diagnosis not present

## 2012-10-19 DIAGNOSIS — I8310 Varicose veins of unspecified lower extremity with inflammation: Secondary | ICD-10-CM | POA: Diagnosis not present

## 2012-11-06 ENCOUNTER — Ambulatory Visit (INDEPENDENT_AMBULATORY_CARE_PROVIDER_SITE_OTHER): Payer: Medicare Other | Admitting: Cardiology

## 2012-11-06 DIAGNOSIS — Z7901 Long term (current) use of anticoagulants: Secondary | ICD-10-CM

## 2012-11-06 DIAGNOSIS — I4891 Unspecified atrial fibrillation: Secondary | ICD-10-CM

## 2012-11-06 DIAGNOSIS — Z954 Presence of other heart-valve replacement: Secondary | ICD-10-CM

## 2012-11-23 ENCOUNTER — Ambulatory Visit (INDEPENDENT_AMBULATORY_CARE_PROVIDER_SITE_OTHER): Payer: Medicare Other | Admitting: Internal Medicine

## 2012-11-23 DIAGNOSIS — Z954 Presence of other heart-valve replacement: Secondary | ICD-10-CM

## 2012-11-23 DIAGNOSIS — Z7901 Long term (current) use of anticoagulants: Secondary | ICD-10-CM

## 2012-11-23 DIAGNOSIS — I4891 Unspecified atrial fibrillation: Secondary | ICD-10-CM

## 2012-11-23 DIAGNOSIS — D6859 Other primary thrombophilia: Secondary | ICD-10-CM | POA: Diagnosis not present

## 2012-11-23 LAB — POCT INR: INR: 2.7

## 2012-11-30 ENCOUNTER — Encounter: Payer: Self-pay | Admitting: Radiology

## 2012-11-30 ENCOUNTER — Other Ambulatory Visit: Payer: Self-pay

## 2012-11-30 ENCOUNTER — Encounter (INDEPENDENT_AMBULATORY_CARE_PROVIDER_SITE_OTHER): Payer: Medicare Other

## 2012-11-30 DIAGNOSIS — L821 Other seborrheic keratosis: Secondary | ICD-10-CM | POA: Diagnosis not present

## 2012-11-30 DIAGNOSIS — L570 Actinic keratosis: Secondary | ICD-10-CM | POA: Diagnosis not present

## 2012-11-30 DIAGNOSIS — D235 Other benign neoplasm of skin of trunk: Secondary | ICD-10-CM | POA: Diagnosis not present

## 2012-11-30 DIAGNOSIS — Z23 Encounter for immunization: Secondary | ICD-10-CM | POA: Diagnosis not present

## 2012-11-30 DIAGNOSIS — I4891 Unspecified atrial fibrillation: Secondary | ICD-10-CM

## 2012-11-30 DIAGNOSIS — L723 Sebaceous cyst: Secondary | ICD-10-CM | POA: Diagnosis not present

## 2012-11-30 NOTE — Progress Notes (Signed)
Patient ID: [name], male   DOB: 1934/09/28, 78 y.o.   MRN: [id number] E-Cardio 48 Hour holter monitor applied.

## 2012-12-07 ENCOUNTER — Ambulatory Visit (INDEPENDENT_AMBULATORY_CARE_PROVIDER_SITE_OTHER): Payer: Medicare Other | Admitting: Internal Medicine

## 2012-12-07 ENCOUNTER — Encounter: Payer: Self-pay | Admitting: Internal Medicine

## 2012-12-07 VITALS — BP 139/78 | HR 59 | Ht 74.0 in | Wt 224.4 lb

## 2012-12-07 DIAGNOSIS — I4891 Unspecified atrial fibrillation: Secondary | ICD-10-CM | POA: Diagnosis not present

## 2012-12-07 DIAGNOSIS — R079 Chest pain, unspecified: Secondary | ICD-10-CM | POA: Diagnosis not present

## 2012-12-07 LAB — PACEMAKER DEVICE OBSERVATION
AL IMPEDENCE PM: 540 Ohm
BMOD-0002RA: 8
BRDY-0002RA: 60 {beats}/min
BRDY-0004RA: 100 {beats}/min
DEVICE MODEL PM: 567685

## 2012-12-07 NOTE — Progress Notes (Signed)
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 He is an unscheduled visit calling because of palpitations and fatigue and dyspnea.  The patient has an extensive cardiac history with long-standing hypertension and sinus node dysfunction, status post pacemaker insertion. He has a history of microvascular coronary disease and epicardial coronary disease as well. He has aortic stenosis status post aortic valve replacement. The patient has maintained sinus rhythm on amiodarone therapy. As time has gone on, he has reduced his dose of amiodarone down to 100 mg daily. He has noted increasing palpitations and a sensation of nonsustained tachycardia. In addition, when he gets up and tries to walk quickly, he quickly gets tired, fatigued, short of breath. On the other hand, when he walks very slowly, he can walk up to 3 miles a day. He has not had syncope. He denies chest pain. Allergies  Allergen Reactions  . Contrast Media [Iodinated Diagnostic Agents] Hives     Current Outpatient Prescriptions  Medication Sig Dispense Refill  . amiodarone (PACERONE) 200 MG tablet 100 mg. one tablet by mouth daily      . aspirin 81 MG tablet Take 81 mg by mouth daily.       . bisacodyl (DULCOLAX) 5 MG EC tablet Take 5 mg by mouth daily as needed.      . Coenzyme Q10 (COQ10) 100 MG CAPS Take 100 mg by mouth daily.      . fish oil-omega-3 fatty acids 1000 MG capsule Take 1 g by mouth as needed.       . hydrochlorothiazide (HYDRODIURIL) 25 MG tablet Take 25 mg by mouth daily.      Marland Kitchen HYDROcodone-acetaminophen (NORCO) 5-325 MG per tablet Take 1 tablet by mouth every 4 (four) hours as needed for pain. For pain  50 tablet  0  . levothyroxine (SYNTHROID, LEVOTHROID) 50 MCG tablet Take 50 mcg by mouth daily.        Marland Kitchen LORazepam (ATIVAN) 1 MG tablet Take one and one-half tablet by mouth at bedtime. For anxiety  45 tablet  0  . metoprolol succinate (TOPROL-XL) 50 MG 24 hr tablet Take 1/2 tablet (25 mg) by mouth daily  90 tablet  1   . nitroGLYCERIN (NITROSTAT) 0.4 MG SL tablet Place 1 tablet (0.4 mg total) under the tongue every 5 (five) minutes as needed for chest pain.  25 tablet  3  . potassium chloride SA (K-DUR,KLOR-CON) 20 MEQ tablet Take 1 tablet (20 mEq total) by mouth daily.  90 tablet  3  . rosuvastatin (CRESTOR) 20 MG tablet Take 20 mg by mouth daily.      . Tamsulosin HCl (FLOMAX) 0.4 MG CAPS Take 1 capsule (0.4 mg total) by mouth at bedtime every day  1 capsule    . warfarin (COUMADIN) 4 MG tablet Take as directed by Anticoagulation clinic  15 tablet  1   No current facility-administered medications for this visit.     Past Medical History  Diagnosis Date  . Chronotropic incompetence with sinus node dysfunction     Status post Guidant dual-mode, dual-pacing, dual-sensing  pacemaker   implantation now programmed to AAI with recent generator change.  . Coronary artery disease     status post multiple prior percutaneous coronary interventions, microvascular angina per Dr [name]  . Aortic stenosis     moderate aortic stenosis  . Hyperlipidemia   . Hypercoagulable state     chronically anticoagulated with coumadin  . Heart murmur   . Stroke  1990  . Hyperthyroidism   . Benign prostatic hypertrophy   . Arthritis   . Paroxysmal atrial fibrillation     DR. STuckey,   . Hypothyroidism     Dr. [name]    ROS:   All systems reviewed and negative except as noted in the HPI.   Past Surgical History  Procedure Laterality Date  . Pacemaker insertion  1991    Guidant PPM, most recent Generator Change by Dr [name] was 08/22/06  . Hemrrhoidectomy    . Cardiac catheterization      11  . Cardioversion    . Appendectomy    . Aortic valve replacement  03/15/2011    Procedure: AORTIC VALVE REPLACEMENT (AVR);  Surgeon: Alleen Borne, MD;  Location: [facility] OR;  Service: Open Heart Surgery;  Laterality: N/A;  . Maze  03/15/2011    Procedure: MAZE;  Surgeon: Alleen Borne, MD;  Location: [facility] OR;  Service:  Open Heart Surgery;  Laterality: N/A;  . Tee without cardioversion  04/15/2011    Procedure: TRANSESOPHAGEAL ECHOCARDIOGRAM (TEE);  Surgeon: Marca Ancona, MD;  Location: [facility] ENDOSCOPY;  Service: Cardiovascular;  Laterality: N/A;  . Cardioversion  04/15/2011    Procedure: CARDIOVERSION;  Surgeon: Marca Ancona, MD;  Location[facility] ENDOSCOPY;  Service: Cardiovascular;  Laterality: N/A;     Family History  Problem Relation Age of Onset  . Heart disease Brother     Twin brother has coronary disease and recent AVR for AS  . Anesthesia problems Neg Hx   . Hypotension Neg Hx   . Malignant hyperthermia Neg Hx   . Pseudochol deficiency Neg Hx      History   Social History  . Marital Status: Married    Spouse Name: N/A    Number of Children: N/A  . Years of Education: N/A   Occupational History  . Not on file.   Social History Main Topics  . Smoking status: Never Smoker   . Smokeless tobacco: Not on file  . Alcohol Use: No     Comment: occassional  . Drug Use: No  . Sexual Activity: Not on file   Other Topics Concern  . Not on file   Social History Narrative  . No narrative on file     BP 139/78  Pulse 59  Ht 6\' 2"  (1.88 m)  Wt 224 lb 6.4 oz (101.787 kg)  BMI 28.8 kg/m2  Physical Exam:  Well appearing 78 year old man, NAD HEENT: Unremarkable Neck:  6 cm JVD, no thyromegally Back:  No CVA tenderness Lungs:  Clear with no wheezes, rales, or rhonchi. HEART:  Regular rate rhythm, no murmurs, no rubs, no clicks Abd:  soft, positive bowel sounds, no organomegally, no rebound, no guarding Ext:  2 plus pulses, no edema, no cyanosis, no clubbing Skin:  No rashes no nodules Neuro:  CN II through XII intact, motor grossly intact  Holter monitor - normal sinus rhythm with PVCs, and brief runs of atrial tachycardia  DEVICE  Normal device function.  See PaceArt for details.   Assess/Plan: a

## 2012-12-07 NOTE — Assessment & Plan Note (Signed)
The patient continues to have chest tightness with exertion, particularly when he tries to accelerate. I've asked the patient to slow down.

## 2012-12-07 NOTE — Assessment & Plan Note (Signed)
The patient has had an increase in frequency in his atrial arrhythmias, specifically atrial tachycardia. I've asked him to increase his dose of amiodarone from 100 mg daily up to 400 mg daily for 2 weeks. He will reduce his dose to 100 mg daily. He will continue his other medications for atrial fibrillation and atrial tachycardia.

## 2012-12-12 ENCOUNTER — Other Ambulatory Visit: Payer: Self-pay | Admitting: *Deleted

## 2012-12-12 ENCOUNTER — Other Ambulatory Visit: Payer: Self-pay

## 2012-12-12 MED ORDER — AMIODARONE HCL 200 MG PO TABS: 100.0000 mg | ORAL_TABLET | Freq: Every day | ORAL | Status: DC

## 2012-12-13 ENCOUNTER — Ambulatory Visit (INDEPENDENT_AMBULATORY_CARE_PROVIDER_SITE_OTHER): Payer: Medicare Other | Admitting: Pharmacist

## 2012-12-13 DIAGNOSIS — I4891 Unspecified atrial fibrillation: Secondary | ICD-10-CM

## 2012-12-13 DIAGNOSIS — Z7901 Long term (current) use of anticoagulants: Secondary | ICD-10-CM

## 2012-12-13 DIAGNOSIS — Z954 Presence of other heart-valve replacement: Secondary | ICD-10-CM

## 2012-12-13 LAB — POCT INR: INR: 4.2

## 2012-12-20 ENCOUNTER — Encounter: Payer: Self-pay | Admitting: Cardiology

## 2012-12-20 ENCOUNTER — Encounter: Payer: Self-pay | Admitting: Internal Medicine

## 2012-12-27 ENCOUNTER — Ambulatory Visit (INDEPENDENT_AMBULATORY_CARE_PROVIDER_SITE_OTHER): Payer: Medicare Other | Admitting: Cardiovascular Disease

## 2012-12-27 DIAGNOSIS — Z7901 Long term (current) use of anticoagulants: Secondary | ICD-10-CM

## 2012-12-27 DIAGNOSIS — Z954 Presence of other heart-valve replacement: Secondary | ICD-10-CM

## 2012-12-27 DIAGNOSIS — I4891 Unspecified atrial fibrillation: Secondary | ICD-10-CM

## 2013-01-17 ENCOUNTER — Encounter: Payer: Self-pay | Admitting: Internal Medicine

## 2013-01-26 ENCOUNTER — Ambulatory Visit (INDEPENDENT_AMBULATORY_CARE_PROVIDER_SITE_OTHER): Payer: Medicare Other | Admitting: Cardiology

## 2013-01-26 DIAGNOSIS — I4891 Unspecified atrial fibrillation: Secondary | ICD-10-CM

## 2013-01-26 DIAGNOSIS — Z954 Presence of other heart-valve replacement: Secondary | ICD-10-CM

## 2013-01-26 DIAGNOSIS — Z7901 Long term (current) use of anticoagulants: Secondary | ICD-10-CM

## 2013-01-30 DIAGNOSIS — E038 Other specified hypothyroidism: Secondary | ICD-10-CM | POA: Diagnosis not present

## 2013-01-31 DIAGNOSIS — R6889 Other general symptoms and signs: Secondary | ICD-10-CM | POA: Diagnosis not present

## 2013-01-31 DIAGNOSIS — R5381 Other malaise: Secondary | ICD-10-CM | POA: Diagnosis not present

## 2013-01-31 DIAGNOSIS — R52 Pain, unspecified: Secondary | ICD-10-CM | POA: Diagnosis not present

## 2013-01-31 DIAGNOSIS — M1710 Unilateral primary osteoarthritis, unspecified knee: Secondary | ICD-10-CM | POA: Diagnosis not present

## 2013-01-31 DIAGNOSIS — M19049 Primary osteoarthritis, unspecified hand: Secondary | ICD-10-CM | POA: Diagnosis not present

## 2013-01-31 DIAGNOSIS — E559 Vitamin D deficiency, unspecified: Secondary | ICD-10-CM | POA: Diagnosis not present

## 2013-01-31 DIAGNOSIS — IMO0001 Reserved for inherently not codable concepts without codable children: Secondary | ICD-10-CM | POA: Diagnosis not present

## 2013-01-31 DIAGNOSIS — Z79899 Other long term (current) drug therapy: Secondary | ICD-10-CM | POA: Diagnosis not present

## 2013-02-07 DIAGNOSIS — L570 Actinic keratosis: Secondary | ICD-10-CM | POA: Diagnosis not present

## 2013-02-08 ENCOUNTER — Ambulatory Visit (INDEPENDENT_AMBULATORY_CARE_PROVIDER_SITE_OTHER): Payer: Medicare Other | Admitting: Pharmacist

## 2013-02-08 DIAGNOSIS — I4891 Unspecified atrial fibrillation: Secondary | ICD-10-CM

## 2013-02-08 DIAGNOSIS — Z954 Presence of other heart-valve replacement: Secondary | ICD-10-CM

## 2013-02-08 DIAGNOSIS — D6859 Other primary thrombophilia: Secondary | ICD-10-CM | POA: Diagnosis not present

## 2013-02-08 DIAGNOSIS — Z7901 Long term (current) use of anticoagulants: Secondary | ICD-10-CM

## 2013-02-08 LAB — POCT INR: INR: 1.7

## 2013-02-14 ENCOUNTER — Encounter: Payer: Self-pay | Admitting: *Deleted

## 2013-02-14 ENCOUNTER — Other Ambulatory Visit: Payer: Self-pay | Admitting: Oncology

## 2013-02-14 DIAGNOSIS — D472 Monoclonal gammopathy: Secondary | ICD-10-CM

## 2013-02-14 NOTE — Progress Notes (Signed)
Received a referral by fax and called Maggie to let her know that Dr. Darnelle Catalan sees the breast patients and this pt does not have breast cancer and she stated that Dr. Velia Meyer is requesting Dr. Darnelle Catalan to see this pt.  Took paperwork to Dr. Darnelle Catalan to make him aware and requested for a date and time.

## 2013-02-15 ENCOUNTER — Telehealth: Payer: Self-pay | Admitting: *Deleted

## 2013-02-15 ENCOUNTER — Other Ambulatory Visit: Payer: Self-pay | Admitting: Oncology

## 2013-02-15 NOTE — Telephone Encounter (Signed)
Called pt to schedule an appt and he wants to speak with Dr. Darnelle Catalan about this.  Took paperwork to Dr. Darnelle Catalan for him to call pt and then give me some direction on how to schedule.

## 2013-02-17 ENCOUNTER — Other Ambulatory Visit: Payer: Self-pay | Admitting: Oncology

## 2013-02-17 ENCOUNTER — Encounter: Payer: Self-pay | Admitting: Oncology

## 2013-02-17 DIAGNOSIS — D472 Monoclonal gammopathy: Secondary | ICD-10-CM | POA: Insufficient documentation

## 2013-02-17 HISTORY — DX: Monoclonal gammopathy: D47.2

## 2013-02-19 ENCOUNTER — Other Ambulatory Visit: Payer: Self-pay | Admitting: Oncology

## 2013-02-19 ENCOUNTER — Telehealth: Payer: Self-pay

## 2013-02-19 ENCOUNTER — Other Ambulatory Visit: Payer: Self-pay

## 2013-02-19 MED ORDER — METOPROLOL SUCCINATE ER 50 MG PO TB24: ORAL_TABLET | ORAL | Status: DC

## 2013-02-19 NOTE — Telephone Encounter (Signed)
I received a phone call from the pt and he is currently at the pharmacy and needs refills on Warfarin and Metoprolol Succinate.  The pt gave his phone to the pharmacist and I spoke with her and authorized refills.  I gave one additional refill on Warfarin 4mg  as directed and gave 3 refills on Metoprolol Succinate.

## 2013-02-20 ENCOUNTER — Telehealth: Payer: Self-pay | Admitting: Oncology

## 2013-02-20 NOTE — Telephone Encounter (Signed)
Talked to pt gave him appt for labs on 04/05/12

## 2013-02-28 ENCOUNTER — Telehealth: Payer: Self-pay | Admitting: Oncology

## 2013-02-28 NOTE — Telephone Encounter (Signed)
S/W PT AND GVE NP APPT 02/11 @ 9:30 W/DR. GRANFORTUNA REFERRING DR. SHAILI DEVESHWAR DX- MGUS WELCOME PACKET MAILED

## 2013-04-04 DIAGNOSIS — H2510 Age-related nuclear cataract, unspecified eye: Secondary | ICD-10-CM | POA: Diagnosis not present

## 2013-04-05 ENCOUNTER — Other Ambulatory Visit (HOSPITAL_BASED_OUTPATIENT_CLINIC_OR_DEPARTMENT_OTHER): Payer: Medicare Other

## 2013-04-05 DIAGNOSIS — D472 Monoclonal gammopathy: Secondary | ICD-10-CM

## 2013-04-05 LAB — CBC & DIFF AND RETIC
BASO%: 0.2 % (ref 0.0–2.0)
BASOS ABS: 0 10*3/uL (ref 0.0–0.1)
EOS%: 0.7 % (ref 0.0–7.0)
Eosinophils Absolute: 0 10*3/uL (ref 0.0–0.5)
HCT: 41.5 % (ref 38.4–49.9)
HEMOGLOBIN: 13.8 g/dL (ref 13.0–17.1)
IMMATURE RETIC FRACT: 2.1 % — AB (ref 3.00–10.60)
LYMPH#: 0.7 10*3/uL — AB (ref 0.9–3.3)
LYMPH%: 11.4 % — ABNORMAL LOW (ref 14.0–49.0)
MCH: 29.2 pg (ref 27.2–33.4)
MCHC: 33.3 g/dL (ref 32.0–36.0)
MCV: 87.9 fL (ref 79.3–98.0)
MONO#: 0.2 10*3/uL (ref 0.1–0.9)
MONO%: 4 % (ref 0.0–14.0)
NEUT%: 83.7 % — ABNORMAL HIGH (ref 39.0–75.0)
NEUTROS ABS: 5 10*3/uL (ref 1.5–6.5)
Platelets: 206 10*3/uL (ref 140–400)
RBC: 4.72 10*6/uL (ref 4.20–5.82)
RDW: 15 % — ABNORMAL HIGH (ref 11.0–14.6)
RETIC CT ABS: 45.78 10*3/uL (ref 34.80–93.90)
Retic %: 0.97 % (ref 0.80–1.80)
WBC: 6 10*3/uL (ref 4.0–10.3)

## 2013-04-05 LAB — COMPREHENSIVE METABOLIC PANEL (CC13)
ALBUMIN: 4.3 g/dL (ref 3.5–5.0)
ALT: 16 U/L (ref 0–55)
AST: 19 U/L (ref 5–34)
Alkaline Phosphatase: 52 U/L (ref 40–150)
Anion Gap: 10 mEq/L (ref 3–11)
BILIRUBIN TOTAL: 0.45 mg/dL (ref 0.20–1.20)
BUN: 18.3 mg/dL (ref 7.0–26.0)
CO2: 30 mEq/L — ABNORMAL HIGH (ref 22–29)
Calcium: 9.7 mg/dL (ref 8.4–10.4)
Chloride: 100 mEq/L (ref 98–109)
Creatinine: 1.1 mg/dL (ref 0.7–1.3)
GLUCOSE: 122 mg/dL (ref 70–140)
POTASSIUM: 3.8 meq/L (ref 3.5–5.1)
Sodium: 141 mEq/L (ref 136–145)
TOTAL PROTEIN: 7.6 g/dL (ref 6.4–8.3)

## 2013-04-05 LAB — LACTATE DEHYDROGENASE (CC13): LDH: 203 U/L (ref 125–245)

## 2013-04-05 LAB — MORPHOLOGY: PLT EST: ADEQUATE

## 2013-04-05 LAB — CHCC SMEAR

## 2013-04-09 LAB — IMMUNOFIXATION ELECTROPHORESIS
IgA: 175 mg/dL (ref 68–379)
IgG (Immunoglobin G), Serum: 959 mg/dL (ref 650–1600)
IgM, Serum: 517 mg/dL — ABNORMAL HIGH (ref 41–251)
TOTAL PROTEIN, SERUM ELECTROPHOR: 7.4 g/dL (ref 6.0–8.3)

## 2013-04-09 LAB — KAPPA/LAMBDA LIGHT CHAINS
KAPPA LAMBDA RATIO: 0.87 (ref 0.26–1.65)
Kappa free light chain: 1.35 mg/dL (ref 0.33–1.94)
Lambda Free Lght Chn: 1.56 mg/dL (ref 0.57–2.63)

## 2013-04-10 ENCOUNTER — Ambulatory Visit (INDEPENDENT_AMBULATORY_CARE_PROVIDER_SITE_OTHER): Payer: Medicare Other | Admitting: Pharmacist

## 2013-04-10 DIAGNOSIS — I4891 Unspecified atrial fibrillation: Secondary | ICD-10-CM

## 2013-04-10 DIAGNOSIS — Z954 Presence of other heart-valve replacement: Secondary | ICD-10-CM

## 2013-04-10 DIAGNOSIS — Z7901 Long term (current) use of anticoagulants: Secondary | ICD-10-CM

## 2013-04-10 LAB — POCT INR: INR: 2.1

## 2013-04-11 ENCOUNTER — Ambulatory Visit (HOSPITAL_BASED_OUTPATIENT_CLINIC_OR_DEPARTMENT_OTHER): Payer: Medicare Other | Admitting: Oncology

## 2013-04-11 ENCOUNTER — Ambulatory Visit: Payer: Medicare Other

## 2013-04-11 ENCOUNTER — Other Ambulatory Visit: Payer: Self-pay | Admitting: Dermatology

## 2013-04-11 ENCOUNTER — Encounter: Payer: Self-pay | Admitting: Oncology

## 2013-04-11 VITALS — BP 125/77 | HR 59 | Temp 96.9°F

## 2013-04-11 DIAGNOSIS — D472 Monoclonal gammopathy: Secondary | ICD-10-CM | POA: Diagnosis not present

## 2013-04-11 DIAGNOSIS — D485 Neoplasm of uncertain behavior of skin: Secondary | ICD-10-CM | POA: Diagnosis not present

## 2013-04-11 DIAGNOSIS — L570 Actinic keratosis: Secondary | ICD-10-CM | POA: Diagnosis not present

## 2013-04-11 NOTE — Progress Notes (Signed)
New Patient Hematology-Oncology Evaluation   [name] [id number] 03-18-1934 79 y.o. 04/11/2013  CC: Dr. 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 Most of his medical problems have centered around vascular and cardiac issues. He had a stroke at age 43 in the distribution of the left vertebral artery. He subsequently developed coronary disease. He never had an MI. He did require coronary stenting x2. He developed atrial fibrillation. He has been cardioverted 3 times. He had a Maze procedure.  He also developed aortic valve disease and had a bovine aortic valve replacement in January of 2013. He has had some problems with increasing arthritis in the small joints of his hands. His identical twin brother has been diagnosed with polymyalgia rheumatica. [name] was recently evaluated by Dr. Patrecia Pour, rheumatology. As part of an evaluation to rule out PMR, additional laboratories were done on 01/31/2013. There was an  isolated elevation of IgM immunoglobulin 494 mg percent (41-251) with a normal IgG, 858 ounces [phone]), and IgA 169 (68-379). There was no monoclonal spike in the gamma region. There was a slight elevation of beta-2 globulin. Immunofixation electrophoresis was not done.  Lab profile was repeated again in anticipation of today's visit on February 5. IgM overall stable at 517 mg percent. Immunofixation electrophoresis with  monoclonal lambda restriction. Hemoglobin is 13.8. Normal white count and platelet count. Serum albumin is 4.3, total protein 7.6, BUN 18, creatinine 1.1, calcium 9.7.  He has no signs or symptoms of hyperviscosity syndrome. He denies any paresthesias. He has no focal bone pain. He has no constitutional symptoms. He remains quite active. He just got back from a skiing trip to [place] with [place] wife.     PMH: Past Medical History  Diagnosis Date   . Chronotropic incompetence with sinus node dysfunction     Status post Guidant dual-mode, dual-pacing, dual-sensing  pacemaker   implantation now programmed to AAI with recent generator change.  . Coronary artery disease     status post multiple prior percutaneous coronary interventions, microvascular angina per Dr Olevia Perches  . Aortic stenosis     moderate aortic stenosis  . Hyperlipidemia   . Hypercoagulable state     chronically anticoagulated with coumadin  . Heart murmur   . Stroke     1990  . Hyperthyroidism   . Benign prostatic hypertrophy   . Arthritis   . Paroxysmal atrial fibrillation     DR. STuckey,   . Hypothyroidism     Dr. Elyse Hsu  . MGUS (monoclonal gammopathy of unknown significance) 02/17/2013  He has mild hypothyroidism related to use of amiodarone to control atrial fibrillation. He denies history of peptic ulcer disease, hepatitis, yellow jaundice, malaria, seizure, He was told by a good neurologist and he has ocular myasthenia.   Past Surgical History  Procedure Laterality Date  . Pacemaker insertion  1991    Guidant PPM, most recent Generator Change by Dr Olevia Perches was 08/22/06  . Hemrrhoidectomy    . Cardiac catheterization      11  . Cardioversion    . Appendectomy    . Aortic valve replacement  03/15/2011    Procedure: AORTIC VALVE REPLACEMENT (AVR);  Surgeon: Gaye Pollack, MD;  Location: [place];  Service: Open Heart Surgery;  Laterality: N/A;  . Maze  03/15/2011    Procedure: MAZE;  Surgeon: Gaye Pollack, MD;  Location: [place];  Service: Open Heart Surgery;  Laterality: N/A;  . Tee without cardioversion  04/15/2011    Procedure: TRANSESOPHAGEAL ECHOCARDIOGRAM (TEE);  Surgeon: Loralie Champagne, MD;  Location: [place];  Service: Cardiovascular;  Laterality: N/A;  . Cardioversion  04/15/2011    Procedure: CARDIOVERSION;  Surgeon: Loralie Champagne, MD;  Location: [place];  Service: Cardiovascular;  Laterality: N/A;    Allergies: Allergies  Allergen  Reactions  . Contrast Media [Iodinated Diagnostic Agents] Hives    Medications: Current outpatient prescriptions:amiodarone (PACERONE) 200 MG tablet, Take 0.5 tablets (100 mg total) by mouth daily., Disp: 30 tablet, Rfl: 6;  aspirin 81 MG tablet, Take 81 mg by mouth daily. , Disp: , Rfl: ;  bisacodyl (DULCOLAX) 5 MG EC tablet, Take 5 mg by mouth daily as needed., Disp: , Rfl: ;  Coenzyme Q10 (COQ10) 100 MG CAPS, Take 100 mg by mouth daily., Disp: , Rfl:  fish oil-omega-3 fatty acids 1000 MG capsule, Take 1 g by mouth as needed. , Disp: , Rfl: ;  hydrochlorothiazide (HYDRODIURIL) 25 MG tablet, Take 25 mg by mouth daily., Disp: , Rfl: ;  HYDROcodone-acetaminophen (NORCO) 5-325 MG per tablet, Take 1 tablet by mouth every 4 (four) hours as needed for pain. For pain, Disp: 50 tablet, Rfl: 0;  levothyroxine (SYNTHROID, LEVOTHROID) 50 MCG tablet, Take 50 mcg by mouth daily.  , Disp: , Rfl:  LORazepam (ATIVAN) 1 MG tablet, Take one and one-half tablet by mouth at bedtime. For anxiety, Disp: 45 tablet, Rfl: 0;  metoprolol succinate (TOPROL-XL) 50 MG 24 hr tablet, Take 1/2 tablet (25 mg) by mouth daily, Disp: 90 tablet, Rfl: 1;  potassium chloride SA (K-DUR,KLOR-CON) 20 MEQ tablet, Take 1 tablet (20 mEq total) by mouth daily., Disp: 90 tablet, Rfl: 3 Tamsulosin HCl (FLOMAX) 0.4 MG CAPS, Take 1 capsule (0.4 mg total) by mouth at bedtime every day, Disp: 1 capsule, Rfl: ;  warfarin (COUMADIN) 4 MG tablet, Take 4 mg by mouth daily. 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 He is married. Wife accompanies him today. She is a Marine scientist. They  have 3 healthy children. 2 sons aged 69 and 56. One daughter who is a concert violinist age 16 currently living in [place]. he has never smoked. Marland Kitchen He does not drink alcohol or use illicit drugs.  Family History: Family History  Problem Relation Age of Onset  . Heart disease Brother     Twin brother has coronary disease and recent AVR for AS  . Anesthesia problems Neg Hx   . Hypotension Neg Hx   . Malignant hyperthermia Neg Hx   . Pseudochol deficiency Neg Hx     Review of Systems: Hematology: negative for swollen glands, easy bruising, epistaxis, gum bleeding, hematuria, hematochezia. Question of history of familial coagulopathy. His brother Inocente Salles has history of pulmonary embolus and was evaluated at [facility] many years ago. Results of that evaluation not known to [facility] ENT ROS: negative for - oral lesions or sore throat Breast ROS: Respiratory ROS: negative for - cough, pleuritic pain, shortness of breath or wheezing Cardiovascular ROS: negative for - chest pain, dyspnea on exertion, edema,  irregular heartbeat, murmur, orthopnea, palpitations, paroxysmal nocturnal dyspnea or rapid heart rate Gastrointestinal ROS: negative for - abdominal pain, appetite loss, blood in stools, change in bowel habits, constipation, diarrhea, heartburn, hematemesis, melena, nausea/vomiting or swallowing difficulty/pain Genito-Urinary ROS: Not questioned Musculoskeletal ROS: See above Neurological ROS: negative for - behavioral changes, confusion, dizziness, gait disturbance, headaches, impaired coordination/balance, memory loss, numbness/tingling, or paresthesias Dermatological ROS: negative for rash, ecchymosis Remaining ROS negative.  Physical Exam: Blood pressure 125/77, pulse 59, temperature 96.9 F (36.1 C), temperature source Oral. Wt Readings from Last 3 Encounters:  12/07/12 224 lb 6.4 oz (101.787 kg)  10/11/12 225 lb (102.059 kg)  06/12/12 220 lb (99.791 kg)     General appearance:  Well-nourished Caucasian man HENNT: Mild ptosis right eye. Pharynx no erythema, exudate, mass, or ulcer. No thyromegaly or thyroid nodules Lymph nodes: No cervical, supraclavicular, or axillary lymphadenopathy Breasts:  Lungs: Clear to auscultation, resonant to percussion throughout Heart: Regular rhythm, 1/6 aortic systolic murmur, no gallop, no rub, no click, no edema Abdomen: Soft, nontender, normal bowel sounds, no mass, no organomegaly Extremities: No edema, no calf tenderness Musculoskeletal: no joint deformities GU:  Vascular: Carotid pulses 1, no bruits,  Neurologic: Alert, oriented, PERRLA, I was unable to visualize his optic discs  no hemorrhage , cranial nerves grossly normal, motor strength 5 over 5, reflexes 1+ symmetric, upper body coordination normal, gait normal, vibration sense is normal over the fingertips by tuning fork exam Skin: No rash or ecchymosis    Lab Results: Lab Results  Component Value Date   WBC 6.0 04/05/2013   HGB 13.8 04/05/2013   HCT 41.5 04/05/2013   MCV 87.9 04/05/2013   PLT 206 04/05/2013     Chemistry      Component Value Date/Time   NA 141 04/05/2013 1159   NA 138 10/16/2012 1042   K 3.8 04/05/2013 1159   K 3.6 10/16/2012 1042   CL 101 10/16/2012 1042   CO2 30* 04/05/2013 1159   CO2 29 10/16/2012 1042   BUN 18.3 04/05/2013 1159   BUN 15 10/16/2012 1042   CREATININE 1.1 04/05/2013 1159   CREATININE 1.0 10/16/2012 1042   CREATININE 1.26 11/16/2010 1355      Component Value Date/Time   CALCIUM 9.7 04/05/2013 1159   CALCIUM 9.2 10/16/2012 1042   ALKPHOS 52 04/05/2013 1159   ALKPHOS 54 10/16/2012 1042   AST 19 04/05/2013 1159   AST 29 10/16/2012 1042   ALT 16 04/05/2013 1159   ALT 22 10/16/2012 1042   BILITOT 0.45 04/05/2013 1159   BILITOT 0.6 10/16/2012 1042      Impression and Plan: IgM monoclonal gammopathy undetermined significance  We discussed the differential for IgM monoclonal gammopathies which includes at the top of the list:  Waldenstrm's  macroglobulinemia which is really a low grade, well-differentiated lymphocytic lymphoma with an IgM paraprotein,  IgM myeloma,  which is less common than IgG or IgA myeloma,  and a splenic lymphoma with villous lymphocytes-low-grade lymphoma involving the spleen which is occasionally associated with a monoclonal IgM paraprotein.  I do not think he has myeloma since he does not have any concomitant suppression of his IgG or IgA immunoglobulins and he is not anemic. If he does have early Waldenstrm's, the treatment of choice would be observation alone at this point in the absence of any symptoms or anemia  and only a mild elevation of the IgM.  We discussed whether or not to do a bone marrow biopsy. This would not  immediately change our management which will be observation alone. He would prefer just to follow his immunoglobulin levels and CBCs at this point in time and defer a bone marrow biopsy until such time as there is a significant rise in the IgM paraprotein.  I will see him again in 6 months to reevaluate.     Annia Belt, MD 04/11/2013, 2:29 PM

## 2013-04-11 NOTE — Progress Notes (Signed)
The system was down so could not check the patient in. All documents were done last visit.  Check in for financial counselor only.

## 2013-04-12 ENCOUNTER — Telehealth: Payer: Self-pay | Admitting: *Deleted

## 2013-04-12 NOTE — Telephone Encounter (Signed)
Emailed MD and RH making them aware that pt needs to be scheduled w/ G in 6 months. Pt is aware that someone will call w/ appt...td

## 2013-04-19 DIAGNOSIS — L570 Actinic keratosis: Secondary | ICD-10-CM | POA: Diagnosis not present

## 2013-04-30 ENCOUNTER — Ambulatory Visit (INDEPENDENT_AMBULATORY_CARE_PROVIDER_SITE_OTHER): Payer: Medicare Other | Admitting: Internal Medicine

## 2013-04-30 ENCOUNTER — Other Ambulatory Visit: Payer: Self-pay | Admitting: Oncology

## 2013-04-30 ENCOUNTER — Encounter: Payer: Self-pay | Admitting: Internal Medicine

## 2013-04-30 ENCOUNTER — Ambulatory Visit (INDEPENDENT_AMBULATORY_CARE_PROVIDER_SITE_OTHER): Payer: Medicare Other

## 2013-04-30 VITALS — BP 122/82 | HR 84 | Ht 74.0 in | Wt 225.0 lb

## 2013-04-30 DIAGNOSIS — Z7901 Long term (current) use of anticoagulants: Secondary | ICD-10-CM

## 2013-04-30 DIAGNOSIS — I4892 Unspecified atrial flutter: Secondary | ICD-10-CM | POA: Diagnosis not present

## 2013-04-30 DIAGNOSIS — I4891 Unspecified atrial fibrillation: Secondary | ICD-10-CM

## 2013-04-30 DIAGNOSIS — Z954 Presence of other heart-valve replacement: Secondary | ICD-10-CM

## 2013-04-30 DIAGNOSIS — Z950 Presence of cardiac pacemaker: Secondary | ICD-10-CM

## 2013-04-30 LAB — POCT INR: INR: 2

## 2013-04-30 NOTE — Progress Notes (Addendum)
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 He is a pleasant 79 yo man with a h/o PAF and sinus node dysfunction who had done well but noted a rapid ventricular rate at 100/min. He denies syncope. No chest pressure. He denies dietary indiscretion. In the office today, we have tried to pace him through his device (therapeutic INR's) and he has reverted from atrial flutter to atrial fibrillation.  Allergies  Allergen Reactions  . Contrast Media [Iodinated Diagnostic Agents] Hives     Current Outpatient Prescriptions  Medication Sig Dispense Refill  . amiodarone (PACERONE) 200 MG tablet Take 100-200 mg by mouth daily.      Marland Kitchen aspirin 81 MG tablet Take 81 mg by mouth daily.       . bisacodyl (DULCOLAX) 5 MG EC tablet Take 5 mg by mouth daily as needed.      . butalbital-acetaminophen-caffeine (FIORICET, ESGIC) 50-325-40 MG per tablet Take 1 tablet by mouth daily.      . Coenzyme Q10 (COQ10) 100 MG CAPS Take 100 mg by mouth daily.      . Diclofenac Sodium 1.5 % SOLN Take 1 application by mouth as needed.      . fish oil-omega-3 fatty acids 1000 MG capsule Take 1 g by mouth as needed.       Marland Kitchen levothyroxine (SYNTHROID, LEVOTHROID) 50 MCG tablet Take 50 mcg by mouth daily.        Marland Kitchen LORazepam (ATIVAN) 1 MG tablet Take one tablet by mouth at bedtime. For anxiety      . metoprolol succinate (TOPROL-XL) 50 MG 24 hr tablet Take 1/2 tablet (25 mg) by mouth daily  90 tablet  1  . nitroGLYCERIN (NITROSTAT) 0.4 MG SL tablet Place 1 tablet (0.4 mg total) under the tongue every 5 (five) minutes as needed for chest pain.  25 tablet  3  . potassium chloride SA (K-DUR,KLOR-CON) 20 MEQ tablet Take 1 tablet (20 mEq total) by mouth daily.  90 tablet  3  . predniSONE (DELTASONE) 5 MG tablet Take 1 tablet by mouth daily.      . rosuvastatin (CRESTOR) 20 MG tablet Take 20 mg by mouth daily.      . Tamsulosin HCl (FLOMAX) 0.4 MG CAPS Take 1 capsule (0.4 mg total)  by mouth at bedtime every day  1 capsule    . warfarin (COUMADIN) 4 MG tablet Take as directed by Anticoagulation clinic      . hydrochlorothiazide (HYDRODIURIL) 25 MG tablet Take 25 mg by mouth daily.       No current facility-administered medications for this visit.     Past Medical History  Diagnosis Date  . Chronotropic incompetence with sinus node dysfunction     Status post Guidant dual-mode, dual-pacing, dual-sensing  pacemaker   implantation now programmed to AAI with recent generator change.  . Coronary artery disease     status post multiple prior percutaneous coronary interventions, microvascular angina per Dr [name]  . Aortic stenosis     moderate aortic stenosis  . Hyperlipidemia   . Hypercoagulable state     chronically anticoagulated with coumadin  . Heart murmur   . Stroke     1990  . Hyperthyroidism   . Benign prostatic hypertrophy   . Arthritis   . Paroxysmal atrial fibrillation     DR. STuckey,   . Hypothyroidism     Dr. Elyse [name]  . MGUS (monoclonal gammopathy of  unknown significance) 02/17/2013    ROS:   All systems reviewed and negative except as noted in the HPI.   Past Surgical History  Procedure Laterality Date  . Pacemaker insertion  1991    Guidant PPM, most recent Generator Change by Dr [name] was 08/22/06  . Hemrrhoidectomy    . Cardiac catheterization      11  . Cardioversion    . Appendectomy    . Aortic valve replacement  03/15/2011    Procedure: AORTIC VALVE REPLACEMENT (AVR);  Surgeon: Kevin Pollack, MD;  Location: [place];  Service: Open Heart Surgery;  Laterality: N/A;  . Maze  03/15/2011    Procedure: MAZE;  Surgeon: Kevin Pollack, MD;  Location: [place];  Service: Open Heart Surgery;  Laterality: N/A;  . Tee without cardioversion  04/15/2011    Procedure: TRANSESOPHAGEAL ECHOCARDIOGRAM (TEE);  Surgeon: Kevin Champagne, MD;  Location: [place];  Service: Cardiovascular;  Laterality: N/A;  . Cardioversion  04/15/2011    Procedure:  CARDIOVERSION;  Surgeon: Kevin Champagne, MD;  Location: [facility] END[facility];  Service: Cardiovascular;  Laterality: N/A;     Family History  Problem Relation Age of Onset  . Heart disease Brother     Twin brother has coronary disease and recent AVR for AS  . Anesthesia problems Neg Hx   . Hypotension Neg Hx   . Malignant hyperthermia Neg Hx   . Pseudochol deficiency Neg Hx      History   Social History  . Marital Status: Married    Spouse Name: N/A    Number of Children: N/A  . Years of Education: N/A   Occupational History  . Not on file.   Social History Main Topics  . Smoking status: Never Smoker   . Smokeless tobacco: Not on file  . Alcohol Use: No     Comment: occassional  . Drug Use: No  . Sexual Activity: Not on file   Other Topics Concern  . Not on file   Social History Narrative  . No narrative on file     BP 122/82  Pulse 84  Ht 6\' 2"  (1.88 m)  Wt 225 lb (102.059 kg)  BMI 28.88 kg/m2  Physical Exam:  Well appearing 79 yo man, NAD HEENT: Unremarkable Neck:  No JVD, no thyromegally Back:  No CVA tenderness Lungs:  Clear with no wheezes HEART:  IRegular rate rhythm, no murmurs, no rubs, no clicks Abd:  soft, positive bowel sounds, no organomegally, no rebound, no guarding Ext:  2 plus pulses, no edema, no cyanosis, no clubbing Skin:  No rashes no nodules Neuro:  CN II through XII intact, motor grossly intact  EKG - atrial flutter (atypical) with an RVR/CVR  DEVICE  Normal device function.  See PaceArt for details.   Assess/Plan:

## 2013-04-30 NOTE — Patient Instructions (Addendum)
Your physician has recommended that you have a Cardioversion (DCCV). Electrical Cardioversion uses a jolt of electricity to your heart either through paddles or wired patches attached to your chest. This is a controlled, usually prescheduled, procedure. Defibrillation is done under light anesthesia in the hospital, and you usually go home the day of the procedure. This is done to get your heart back into a normal rhythm. You are not awake for the procedure. Please see the instruction sheet given to you today.  We will call you with time/date  Your physician has recommended you make the following change in your medication:  1) Take extra 1/2 tablet Coumadin tonight and tomorrow night 2) Take Amiodarone 200 mg tonight, 400 mg tomorrow and daily after that until cardioversion

## 2013-04-30 NOTE — Assessment & Plan Note (Addendum)
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 Will recheck at the time of his DCCV.

## 2013-04-30 NOTE — Assessment & Plan Note (Signed)
I have tried to pace the patient back to NSR without success. He will continue his anticoagulation, increase his amiodarone and we will plan to proceed with DCCV on Thursday

## 2013-05-01 ENCOUNTER — Telehealth: Payer: Self-pay | Admitting: *Deleted

## 2013-05-01 LAB — BASIC METABOLIC PANEL
BUN: 19 mg/dL (ref 6–23)
CHLORIDE: 105 meq/L (ref 96–112)
CO2: 29 mEq/L (ref 19–32)
Calcium: 8.9 mg/dL (ref 8.4–10.5)
Creatinine, Ser: 1.1 mg/dL (ref 0.4–1.5)
GFR: 69.05 mL/min (ref >60.00)
Glucose, Bld: 102 mg/dL — ABNORMAL HIGH (ref 70–99)
POTASSIUM: 3.8 meq/L (ref 3.5–5.1)
Sodium: 139 mEq/L (ref 135–145)

## 2013-05-01 LAB — CBC WITH DIFFERENTIAL/PLATELET
BASOS ABS: 0 10*3/uL (ref 0.0–0.1)
BASOS PCT: 0.3 % (ref 0.0–3.0)
Eosinophils Absolute: 0.2 10*3/uL (ref 0.0–0.7)
Eosinophils Relative: 2 % (ref 0.0–5.0)
HCT: 39.5 % (ref 39.0–52.0)
Hemoglobin: 13.2 g/dL (ref 13.0–17.0)
LYMPHS PCT: 18.2 % (ref 12.0–46.0)
Lymphs Abs: 1.6 10*3/uL (ref 0.7–4.0)
MCHC: 33.5 g/dL (ref 30.0–36.0)
MCV: 88.6 fl (ref 78.0–100.0)
MONO ABS: 0.6 10*3/uL (ref 0.1–1.0)
Monocytes Relative: 7.2 % (ref 3.0–12.0)
NEUTROS PCT: 72.3 % (ref 43.0–77.0)
Neutro Abs: 6.5 10*3/uL (ref 1.4–7.7)
Platelets: 241 10*3/uL (ref 150.0–400.0)
RBC: 4.46 Mil/uL (ref 4.22–5.81)
RDW: 14.3 % (ref 11.5–14.6)
WBC: 9 10*3/uL (ref 4.5–10.5)

## 2013-05-01 NOTE — Telephone Encounter (Signed)
Called patient to inform him of DCCV scheduled for 3/5 at 8am w/ Dr. Lovena Le. Advised patient to be at hospital at 6:30am. Pt tells me he thinks he may be converting back to SR and will call Claiborne Billings today if he is back in NSR. Agreeable to plan.

## 2013-05-03 ENCOUNTER — Encounter (HOSPITAL_COMMUNITY): Admission: RE | Payer: Self-pay | Source: Ambulatory Visit

## 2013-05-03 ENCOUNTER — Ambulatory Visit (HOSPITAL_COMMUNITY): Admission: RE | Admit: 2013-05-03 | Payer: Medicare Other | Source: Ambulatory Visit | Admitting: Internal Medicine

## 2013-05-03 LAB — MDC_IDC_ENUM_SESS_TYPE_INCLINIC
Battery Remaining Longevity: 2.5
Brady Statistic RA Percent Paced: 96 %
Implantable Pulse Generator Serial Number: 567685
MDC IDC SET LEADCHNL RA PACING AMPLITUDE: 3.5 V

## 2013-05-03 SURGERY — CARDIOVERSION
Anesthesia: Monitor Anesthesia Care

## 2013-05-09 ENCOUNTER — Encounter: Payer: Self-pay | Admitting: Internal Medicine

## 2013-05-14 ENCOUNTER — Other Ambulatory Visit: Payer: Self-pay | Admitting: *Deleted

## 2013-05-14 DIAGNOSIS — I4891 Unspecified atrial fibrillation: Secondary | ICD-10-CM

## 2013-05-14 DIAGNOSIS — I4892 Unspecified atrial flutter: Secondary | ICD-10-CM

## 2013-05-14 DIAGNOSIS — Z950 Presence of cardiac pacemaker: Secondary | ICD-10-CM

## 2013-05-14 MED ORDER — AMIODARONE HCL 200 MG PO TABS: 100.0000 mg | ORAL_TABLET | Freq: Every day | ORAL | Status: DC

## 2013-06-11 ENCOUNTER — Ambulatory Visit (INDEPENDENT_AMBULATORY_CARE_PROVIDER_SITE_OTHER): Payer: Medicare Other | Admitting: Cardiology

## 2013-06-11 DIAGNOSIS — I4891 Unspecified atrial fibrillation: Secondary | ICD-10-CM

## 2013-06-11 DIAGNOSIS — Z7901 Long term (current) use of anticoagulants: Secondary | ICD-10-CM

## 2013-06-11 DIAGNOSIS — Z954 Presence of other heart-valve replacement: Secondary | ICD-10-CM

## 2013-06-11 LAB — POCT INR: INR: 2

## 2013-06-12 ENCOUNTER — Other Ambulatory Visit: Payer: Self-pay | Admitting: Cardiovascular Disease

## 2013-06-15 ENCOUNTER — Encounter: Payer: Self-pay | Admitting: Cardiovascular Disease

## 2013-06-15 ENCOUNTER — Ambulatory Visit (INDEPENDENT_AMBULATORY_CARE_PROVIDER_SITE_OTHER): Payer: Medicare Other | Admitting: Cardiovascular Disease

## 2013-06-15 VITALS — BP 136/80 | HR 60 | Ht 74.0 in

## 2013-06-15 DIAGNOSIS — I2510 Atherosclerotic heart disease of native coronary artery without angina pectoris: Secondary | ICD-10-CM | POA: Diagnosis not present

## 2013-06-15 DIAGNOSIS — I359 Nonrheumatic aortic valve disorder, unspecified: Secondary | ICD-10-CM

## 2013-06-15 DIAGNOSIS — I4891 Unspecified atrial fibrillation: Secondary | ICD-10-CM | POA: Diagnosis not present

## 2013-06-15 MED ORDER — POTASSIUM CHLORIDE CRYS ER 20 MEQ PO TBCR: 20.0000 meq | EXTENDED_RELEASE_TABLET | Freq: Every day | ORAL | Status: DC

## 2013-06-15 NOTE — Progress Notes (Signed)
HPI:  [name] returns for follow-up. He is now 79 years old. He was last seen in August 2014. He's been followed for coronary artery disease, cardiac syndrome X., aortic valve stenosis, and atrial fibrillation. He underwent aortic valve replacement with a 25 mm Edwards pericardial bioprosthesis and left-sided radiofrequency cryo- Cox maze procedure with ligation of the left atrial appendage in January 2013. He's had recurrence of atrial fibrillation and required amiodarone.  Since I have last seen him, he has had symptomatic atrial fibrillation and flutter. He's been followed closely by Dr. Lovena Le. He's had to increase his amiodarone dose. He was tentatively scheduled for cardioversion but converted spontaneously. He feels like he is back in sinus rhythm the vast majority of the time now. Symptoms up with his atrial fibrillation include palpitations, shortness of breath, and marked fatigue.  The patient is doing well at present. He has mild fullness in his chest when he first starts walking but this completely resolves if he continues. This symptom is markedly improved compared to how he felt before aortic valve replacement. He has dyspnea in a similar fashion when he first walks upstairs but has no symptoms with moderate level activity. He denies orthopnea, PND, or lightheadedness.  Other medical problems include MGUS, hypothyroidism, and somewhat labile BPs. He notes cholesterol has been excellent on rosuvastatin.  Outpatient Encounter Prescriptions as of 06/15/2013  Medication Sig  . amiodarone (PACERONE) 200 MG tablet Take 0.5-1 tablets (100-200 mg total) by mouth daily.  . bisacodyl (DULCOLAX) 5 MG EC tablet Take 5 mg by mouth daily as needed.  . butalbital-acetaminophen-caffeine (FIORICET, ESGIC) 50-325-40 MG per tablet Take 1 tablet by mouth daily.  . Coenzyme Q10 (COQ10) 100 MG CAPS Take 100 mg by mouth daily.  . Diclofenac Sodium 1.5 % SOLN Take 1 application by mouth as needed.  . fish  oil-omega-3 fatty acids 1000 MG capsule Take 1 g by mouth as needed.   . hydrochlorothiazide (HYDRODIURIL) 25 MG tablet Take 25 mg by mouth daily as needed.   Marland Kitchen levothyroxine (SYNTHROID, LEVOTHROID) 50 MCG tablet Take 50 mcg by mouth daily.    Marland Kitchen LORazepam (ATIVAN) 1 MG tablet Take one tablet by mouth at bedtime. For anxiety  . metoprolol succinate (TOPROL-XL) 50 MG 24 hr tablet Take 1/2 tablet (25 mg) by mouth daily  . nitroGLYCERIN (NITROSTAT) 0.4 MG SL tablet Place 1 tablet (0.4 mg total) under the tongue every 5 (five) minutes as needed for chest pain.  . potassium chloride SA (K-DUR,KLOR-CON) 20 MEQ tablet Take 1 tablet (20 mEq total) by mouth daily.  . predniSONE (DELTASONE) 5 MG tablet Take 1 tablet by mouth daily.  . rosuvastatin (CRESTOR) 20 MG tablet Take 20 mg by mouth daily.  . Tamsulosin HCl (FLOMAX) 0.4 MG CAPS Take 1 capsule (0.4 mg total) by mouth at bedtime every day  . warfarin (COUMADIN) 4 MG tablet Take as directed by Anticoagulation clinic  . [DISCONTINUED] potassium chloride SA (K-DUR,KLOR-CON) 20 MEQ tablet Take 1 tablet (20 mEq total) by mouth daily.  . [DISCONTINUED] aspirin 81 MG tablet Take 81 mg by mouth daily.   . [DISCONTINUED] potassium chloride SA (K-DUR,KLOR-CON) 20 MEQ tablet TAKE 1 TABLET BY MOUTH ONCE DAILY    Allergies  Allergen Reactions  . Contrast Media [Iodinated Diagnostic Agents] Hives    Past Medical History  Diagnosis Date  . Chronotropic incompetence with sinus node dysfunction     Status post Guidant dual-mode, dual-pacing, dual-sensing  pacemaker   implantation now programmed  to AAI with recent generator change.  . Coronary artery disease     status post multiple prior percutaneous coronary interventions, microvascular angina per Dr Olevia Perches  . Aortic stenosis     moderate aortic stenosis  . Hyperlipidemia   . Hypercoagulable state     chronically anticoagulated with coumadin  . Heart murmur   . Stroke     1990  . Hyperthyroidism   .  Benign prostatic hypertrophy   . Arthritis   . Paroxysmal atrial fibrillation     DR. STuckey,   . Hypothyroidism     Dr. Elyse Hsu  . MGUS (monoclonal gammopathy of unknown significance) 02/17/2013   ROS: Negative except as per HPI  BP 136/80  Pulse 60  Ht 6\' 2"  (1.88 m)  PHYSICAL EXAM: Pt is alert and oriented, NAD HEENT: normal Neck: JVP - normal, carotids 2+= without bruits Lungs: CTA bilaterally CV: RRR with a grade 2/6 systolic ejection murmur at the upper sternal border Abd: soft, NT, Positive BS, no hepatomegaly Ext: no C/C/E, distal pulses intact and equal Skin: warm/dry no rash  2-D echocardiogram November 2013: Study Conclusions  - Left ventricle: The cavity size was mildly dilated. Wall thickness was increased in a pattern of mild LVH. Systolic function was normal. The estimated ejection fraction was in the range of 55% to 60%. Wall motion was normal; there were no regional wall motion abnormalities. Features are consistent with a pseudonormal left ventricular filling pattern, with concomitant abnormal relaxation and increased filling pressure (grade 2 diastolic dysfunction). - Aortic valve: A bioprosthesis was present. - Mitral valve: Calcified annulus. Mild regurgitation. - Left atrium: The atrium was moderately dilated. - Right ventricle: Systolic function was mildly reduced.  ASSESSMENT AND PLAN: 1. Coronary artery disease, native vessel. Stable symptoms of mild angina with early exercise. Medical program reviewed and will continue the same. He is not on antiplatelet therapy because of long-term warfarin.  2. Aortic stenosis status post bioprosthetic aortic valve replacement. Stable exam. I would like him to have a repeat echocardiogram before he returns in 6 months.  3. Essential hypertension. Office blood pressure is excellent. He is treated with hydrochlorothiazide and metoprolol succinate. He notes labile blood pressures at home. Will continue same  medications for now.  4. Paroxysmal atrial fibrillation/flutter. In sinus rhythm today. Anticoagulated with warfarin. We discussed multiple anticoagulant drugs. He has done well on warfarin for many years and I would not recommend changing at this point. Followed closely by Dr. Lovena Le.  5. Hyperlipidemia. Lipids monitored by Dr Altheimer.  Sherren Mocha 06/15/2013 9:57 AM

## 2013-06-15 NOTE — Patient Instructions (Signed)
Your physician wants you to follow-up in: 6 MONTHS with Dr Cooper.  You will receive a reminder letter in the mail two months in advance. If you don't receive a letter, please call our office to schedule the follow-up appointment.  Your physician has requested that you have an echocardiogram in 6 MONTHS. Echocardiography is a painless test that uses sound waves to create images of your heart. It provides your doctor with information about the size and shape of your heart and how well your heart's chambers and valves are working. This procedure takes approximately one hour. There are no restrictions for this procedure.  Your physician recommends that you continue on your current medications as directed. Please refer to the Current Medication list given to you today.   

## 2013-06-26 ENCOUNTER — Other Ambulatory Visit (HOSPITAL_COMMUNITY): Payer: Medicare Other

## 2013-06-27 ENCOUNTER — Ambulatory Visit (INDEPENDENT_AMBULATORY_CARE_PROVIDER_SITE_OTHER): Payer: Medicare Other | Admitting: *Deleted

## 2013-06-27 DIAGNOSIS — Z954 Presence of other heart-valve replacement: Secondary | ICD-10-CM

## 2013-06-27 DIAGNOSIS — I4891 Unspecified atrial fibrillation: Secondary | ICD-10-CM | POA: Diagnosis not present

## 2013-06-27 DIAGNOSIS — Z7901 Long term (current) use of anticoagulants: Secondary | ICD-10-CM

## 2013-06-27 LAB — POCT INR: INR: 3.3

## 2013-07-05 ENCOUNTER — Other Ambulatory Visit: Payer: Self-pay

## 2013-07-05 MED ORDER — HYDROCHLOROTHIAZIDE 25 MG PO TABS: 25.0000 mg | ORAL_TABLET | Freq: Every day | ORAL | Status: DC | PRN

## 2013-07-06 ENCOUNTER — Ambulatory Visit (INDEPENDENT_AMBULATORY_CARE_PROVIDER_SITE_OTHER): Payer: Medicare Other | Admitting: Pharmacist

## 2013-07-06 DIAGNOSIS — Z7901 Long term (current) use of anticoagulants: Secondary | ICD-10-CM | POA: Diagnosis not present

## 2013-07-06 DIAGNOSIS — Z954 Presence of other heart-valve replacement: Secondary | ICD-10-CM | POA: Diagnosis not present

## 2013-07-06 DIAGNOSIS — I4891 Unspecified atrial fibrillation: Secondary | ICD-10-CM | POA: Diagnosis not present

## 2013-07-06 LAB — POCT INR: INR: 2.2

## 2013-07-27 ENCOUNTER — Ambulatory Visit (INDEPENDENT_AMBULATORY_CARE_PROVIDER_SITE_OTHER): Payer: Medicare Other

## 2013-07-27 DIAGNOSIS — R55 Syncope and collapse: Secondary | ICD-10-CM

## 2013-07-27 MED ORDER — METOPROLOL SUCCINATE ER 50 MG PO TB24: ORAL_TABLET | ORAL | Status: DC

## 2013-07-27 MED ORDER — HYDROCHLOROTHIAZIDE 25 MG PO TABS: ORAL_TABLET | ORAL | Status: DC

## 2013-07-27 NOTE — Progress Notes (Signed)
1.) Reason for visit: Pre-Syncope, dizziness, palpitations  2.) Name of MD requesting visit: Dr Sherren Mocha  3.) H&P: The pt called into the office complaining of dizzy spells over the past 3-4 days.  The pt has not completely passed out. The pt did describe an episode when he went to the bathroom and when going back to bed he became so dizzy that he fell and hit night stand and landed on his bed.  The pt has a scratch on the right side of his arm and face from this fall. The pt said he will check his BP at home when he stands up and his SBP will be in the 70s.   4.) ROS related to problem: EKG obtained and this shows NSR rate of 60 with pacemaker.  The pt did have rare PVCs when watching him on monitor. Pacemaker was interrogated with no events seen.   5.) Assessment and plan per MD: Discussed with Dr Burt Knack and the pt will hold Metoprolol and HCTZ at this time.  Push fluids and liberalize salt.  The pt will contact the office with any other questions or concerns.   Janan Halter RN did educate the pt about the [facility] device and this may be something that the pt needs in the future.

## 2013-07-31 DIAGNOSIS — I951 Orthostatic hypotension: Secondary | ICD-10-CM | POA: Diagnosis not present

## 2013-07-31 DIAGNOSIS — N138 Other obstructive and reflux uropathy: Secondary | ICD-10-CM | POA: Diagnosis not present

## 2013-07-31 DIAGNOSIS — N401 Enlarged prostate with lower urinary tract symptoms: Secondary | ICD-10-CM | POA: Diagnosis not present

## 2013-08-16 DIAGNOSIS — D6859 Other primary thrombophilia: Secondary | ICD-10-CM | POA: Diagnosis not present

## 2013-08-16 LAB — POCT INR: INR: 2.6

## 2013-08-17 ENCOUNTER — Ambulatory Visit (INDEPENDENT_AMBULATORY_CARE_PROVIDER_SITE_OTHER): Payer: Medicare Other | Admitting: Cardiology

## 2013-08-17 DIAGNOSIS — Z7901 Long term (current) use of anticoagulants: Secondary | ICD-10-CM

## 2013-08-17 DIAGNOSIS — Z954 Presence of other heart-valve replacement: Secondary | ICD-10-CM

## 2013-08-24 ENCOUNTER — Other Ambulatory Visit: Payer: Self-pay | Admitting: Cardiovascular Disease

## 2013-09-03 ENCOUNTER — Ambulatory Visit (INDEPENDENT_AMBULATORY_CARE_PROVIDER_SITE_OTHER): Payer: Medicare Other | Admitting: *Deleted

## 2013-09-03 DIAGNOSIS — I4891 Unspecified atrial fibrillation: Secondary | ICD-10-CM

## 2013-09-03 DIAGNOSIS — Z7901 Long term (current) use of anticoagulants: Secondary | ICD-10-CM | POA: Diagnosis not present

## 2013-09-03 DIAGNOSIS — Z954 Presence of other heart-valve replacement: Secondary | ICD-10-CM | POA: Diagnosis not present

## 2013-09-03 LAB — POCT INR: INR: 3.1

## 2013-09-04 DIAGNOSIS — IMO0001 Reserved for inherently not codable concepts without codable children: Secondary | ICD-10-CM | POA: Diagnosis not present

## 2013-09-04 DIAGNOSIS — R509 Fever, unspecified: Secondary | ICD-10-CM | POA: Diagnosis not present

## 2013-09-17 DIAGNOSIS — L089 Local infection of the skin and subcutaneous tissue, unspecified: Secondary | ICD-10-CM | POA: Diagnosis not present

## 2013-09-17 DIAGNOSIS — IMO0002 Reserved for concepts with insufficient information to code with codable children: Secondary | ICD-10-CM | POA: Diagnosis not present

## 2013-09-21 ENCOUNTER — Ambulatory Visit (INDEPENDENT_AMBULATORY_CARE_PROVIDER_SITE_OTHER): Payer: Medicare Other

## 2013-09-21 DIAGNOSIS — Z954 Presence of other heart-valve replacement: Secondary | ICD-10-CM | POA: Diagnosis not present

## 2013-09-21 DIAGNOSIS — Z7901 Long term (current) use of anticoagulants: Secondary | ICD-10-CM | POA: Diagnosis not present

## 2013-09-21 DIAGNOSIS — I4891 Unspecified atrial fibrillation: Secondary | ICD-10-CM

## 2013-09-21 LAB — POCT INR: INR: 1.5

## 2013-09-28 ENCOUNTER — Ambulatory Visit (INDEPENDENT_AMBULATORY_CARE_PROVIDER_SITE_OTHER): Payer: Medicare Other | Admitting: Internal Medicine

## 2013-09-28 ENCOUNTER — Encounter: Payer: Self-pay | Admitting: Internal Medicine

## 2013-09-28 VITALS — BP 125/79 | HR 42 | Ht 73.0 in | Wt 225.0 lb

## 2013-09-28 DIAGNOSIS — I4891 Unspecified atrial fibrillation: Secondary | ICD-10-CM | POA: Diagnosis not present

## 2013-09-28 DIAGNOSIS — I2510 Atherosclerotic heart disease of native coronary artery without angina pectoris: Secondary | ICD-10-CM

## 2013-09-28 DIAGNOSIS — I495 Sick sinus syndrome: Secondary | ICD-10-CM

## 2013-09-28 DIAGNOSIS — I498 Other specified cardiac arrhythmias: Secondary | ICD-10-CM

## 2013-09-28 DIAGNOSIS — I480 Paroxysmal atrial fibrillation: Secondary | ICD-10-CM

## 2013-09-28 LAB — MDC_IDC_ENUM_SESS_TYPE_INCLINIC
Brady Statistic RV Percent Paced: 0 %
Date Time Interrogation Session: 20150731040000
Implantable Pulse Generator Serial Number: 567685
Lead Channel Pacing Threshold Pulse Width: 1 ms
MDC IDC MSMT LEADCHNL RA IMPEDANCE VALUE: 520 Ohm
MDC IDC MSMT LEADCHNL RA PACING THRESHOLD AMPLITUDE: 1.7 V
MDC IDC SET LEADCHNL RA PACING AMPLITUDE: 3.5 V
MDC IDC STAT BRADY RA PERCENT PACED: 98 %

## 2013-09-28 NOTE — Progress Notes (Signed)
Patient Care Team: Limmie Patricia, MD as PCP - General (Endocrinology) Evans Lance, MD (Cardiology) Hillary Bow, MD (Cardiology) Christoper Allegra, MD as Consulting Physician (Rheumatology)   HPI  [name] is a 79 y.o. male Seen as an add-on today because of complaints of shortness of breath.  Is a history of coronary disease with cardiac syndrome X. He has a history of aortic valve replacement with a bioprosthesis and a prior Maze procedure with left atrial appendage ligation January 2013.  His history of paroxysmal atrial fibrillation and has been managed with amiodarone  He comes in today with complaints related to worsening shortness of breath with exertion. It is accompanied occasionally by chest tightness. This is notably different from 3-4 months ago. There is no associated cough. He had no change in his edema. There've been no recent travel.  Past Medical History  Diagnosis Date  . Chronotropic incompetence with sinus node dysfunction     Status post Guidant dual-mode, dual-pacing, dual-sensing  pacemaker   implantation now programmed to AAI with recent generator change.  . Coronary artery disease     status post multiple prior percutaneous coronary interventions, microvascular angina per Dr Olevia Perches  . Aortic stenosis     moderate aortic stenosis  . Hyperlipidemia   . Hypercoagulable state     chronically anticoagulated with coumadin  . Heart murmur   . Stroke     1990  . Hyperthyroidism   . Benign prostatic hypertrophy   . Arthritis   . Paroxysmal atrial fibrillation     DR. STuckey,   . Hypothyroidism     Dr. Elyse Hsu  . MGUS (monoclonal gammopathy of unknown significance) 02/17/2013    Past Surgical History  Procedure Laterality Date  . Pacemaker insertion  1991    Guidant PPM, most recent Generator Change by Dr Olevia Perches was 08/22/06  . Hemrrhoidectomy    . Cardiac catheterization      11  . Cardioversion    . Appendectomy      . Aortic valve replacement  03/15/2011    Procedure: AORTIC VALVE REPLACEMENT (AVR);  Surgeon: Gaye Pollack, MD;  Location: [place];  Service: Open Heart Surgery;  Laterality: N/A;  . Maze  03/15/2011    Procedure: MAZE;  Surgeon: Gaye Pollack, MD;  Location: [place];  Service: Open Heart Surgery;  Laterality: N/A;  . Tee without cardioversion  04/15/2011    Procedure: TRANSESOPHAGEAL ECHOCARDIOGRAM (TEE);  Surgeon: Loralie Champagne, MD;  Location: [place];  Service: Cardiovascular;  Laterality: N/A;  . Cardioversion  04/15/2011    Procedure: CARDIOVERSION;  Surgeon: Loralie Champagne, MD;  Location: [place];  Service: Cardiovascular;  Laterality: N/A;    Current Outpatient Prescriptions  Medication Sig Dispense Refill  . amiodarone (PACERONE) 200 MG tablet Take 0.5-1 tablets (100-200 mg total) by mouth daily.  90 tablet  3  . bisacodyl (DULCOLAX) 5 MG EC tablet Take 5 mg by mouth daily as needed.      . butalbital-acetaminophen-caffeine (FIORICET, ESGIC) 50-325-40 MG per tablet Take 1 tablet by mouth 2 (two) times daily.       . Coenzyme Q10 (COQ10) 100 MG CAPS Take 100 mg by mouth daily.      . Diclofenac Sodium 1.5 % SOLN Take 1 application by mouth as needed.      . doxazosin (CARDURA) 4 MG tablet Take 4 mg by mouth daily.      . fish oil-omega-3 fatty  acids 1000 MG capsule Take 1 g by mouth as needed.       . hydrochlorothiazide (HYDRODIURIL) 25 MG tablet ON HOLD STARTING 07/27/13  90 tablet  3  . HYDROcodone-acetaminophen (NORCO/VICODIN) 5-325 MG per tablet Take 1 tablet by mouth 3 (three) times daily.      Marland Kitchen levothyroxine (SYNTHROID, LEVOTHROID) 50 MCG tablet Take 50 mcg by mouth daily.        Marland Kitchen LORazepam (ATIVAN) 1 MG tablet Take one tablet by mouth at bedtime. For anxiety      . metoprolol succinate (TOPROL-XL) 50 MG 24 hr tablet ON HOLD STARTING 07/27/13-----Take 1/2 tablet (25 mg) by mouth daily  90 tablet  1  . nitroGLYCERIN (NITROSTAT) 0.4 MG SL tablet Place 1 tablet (0.4 mg  total) under the tongue every 5 (five) minutes as needed for chest pain.  25 tablet  3  . potassium chloride SA (K-DUR,KLOR-CON) 20 MEQ tablet Take 1 tablet (20 mEq total) by mouth daily.  90 tablet  3  . rosuvastatin (CRESTOR) 20 MG tablet Take 20 mg by mouth daily.      Marland Kitchen warfarin (COUMADIN) 4 MG tablet TAKE 1 TABLET BY MOUTH ONCE DAILY  90 tablet  1   No current facility-administered medications for this visit.    Allergies  Allergen Reactions  . Contrast Media [Iodinated Diagnostic Agents] Hives    Review of Systems negative except from HPI and PMH  Physical Exam BP 125/79  Pulse 42  Ht 6\' 1"  (1.854 m)  Wt 225 lb (102.059 kg)  BMI 29.69 kg/m2 Well developed and well nourished in no acute distress HENT normal E scleral and icterus clear Neck Supple JVP flat; carotids brisk and full Clear to ausculation  Regular rate and rhythm, 2/6 systolic murmur  Soft with active bowel sounds No clubbing cyanosis  Edema Alert and oriented, grossly normal motor and sensory function Skin Warm and Dry    Assessment and  Plan  Dyspnea on Exertion /chest discomfort  Coronary artery disease with prior stenting  Aortic stenosis  S/p AVT  Afib/Flutter  Amiodarone rx  PVCs  Patient has a subacute change in exercise tolerance which may or may not have a sporadic component. This is clearly different from 6-12 months ago. The accompanying chest discomfort is worrisome for progressive CAD, but the differential is broad and include amiodarone lung toxicity, not withstanding absence of cough, functional bradycardia related to PVCs and a cardiomyopathy related to PVCs, chronotropic incompetence with an  /inadequate rate response from  Pacing  For now we will undertake an echo to look at left ventricular function, we'll obtain a pulmonary function tests with DLCO  He will try to correlate  functional status  with heart rate changes, given the fact that he has frequent ventricular ectopy which  may be activated by exercise and he has noted some occasions that his heart rate goes down with exercise. =e may be helpful. Catheter ablation could also be considered

## 2013-09-28 NOTE — Patient Instructions (Signed)
Your physician has requested that you have an echocardiogram. Echocardiography is a painless test that uses sound waves to create images of your heart. It provides your doctor with information about the size and shape of your heart and how well your heart's chambers and valves are working. This procedure takes approximately one hour. There are no restrictions for this procedure.   Your physician has recommended that you have a pulmonary function test. Pulmonary Function Tests are a group of tests that measure how well air moves in and out of your lungs.  Your physician recommends that you schedule a follow-up appointment in: 3-4 weeks with Dr. Burt Knack.

## 2013-10-01 ENCOUNTER — Ambulatory Visit (HOSPITAL_COMMUNITY): Payer: Medicare Other | Attending: Internal Medicine | Admitting: Cardiology

## 2013-10-01 ENCOUNTER — Ambulatory Visit (INDEPENDENT_AMBULATORY_CARE_PROVIDER_SITE_OTHER): Payer: Medicare Other | Admitting: Internal Medicine

## 2013-10-01 ENCOUNTER — Other Ambulatory Visit: Payer: Self-pay | Admitting: *Deleted

## 2013-10-01 ENCOUNTER — Ambulatory Visit (INDEPENDENT_AMBULATORY_CARE_PROVIDER_SITE_OTHER)
Admission: RE | Admit: 2013-10-01 | Discharge: 2013-10-01 | Disposition: A | Discharge status: Home / Self Care | Payer: Medicare Other | Source: Ambulatory Visit | Attending: Internal Medicine | Admitting: Internal Medicine

## 2013-10-01 DIAGNOSIS — I4891 Unspecified atrial fibrillation: Secondary | ICD-10-CM

## 2013-10-01 DIAGNOSIS — R0602 Shortness of breath: Secondary | ICD-10-CM

## 2013-10-01 DIAGNOSIS — I2510 Atherosclerotic heart disease of native coronary artery without angina pectoris: Secondary | ICD-10-CM | POA: Diagnosis not present

## 2013-10-01 DIAGNOSIS — I480 Paroxysmal atrial fibrillation: Secondary | ICD-10-CM

## 2013-10-01 DIAGNOSIS — Z954 Presence of other heart-valve replacement: Secondary | ICD-10-CM | POA: Diagnosis not present

## 2013-10-01 DIAGNOSIS — E785 Hyperlipidemia, unspecified: Secondary | ICD-10-CM | POA: Insufficient documentation

## 2013-10-01 LAB — PULMONARY FUNCTION TEST
DL/VA % pred: 67 %
DL/VA: 3.2 ml/min/mmHg/L
DLCO UNC: 22.64 ml/min/mmHg
DLCO unc % pred: 62 %
FEF 25-75 Pre: 2.45 L/sec
FEF2575-%PRED-PRE: 101 %
FEV1-%PRED-PRE: 99 %
FEV1-Pre: 3.36 L
FEV1FVC-%Pred-Pre: 102 %
FEV6-%PRED-PRE: 101 %
FEV6-PRE: 4.45 L
FEV6FVC-%Pred-Pre: 104 %
FVC-%PRED-PRE: 97 %
FVC-PRE: 4.53 L
Pre FEV1/FVC ratio: 74 %
Pre FEV6/FVC Ratio: 98 %
RV % PRED: 99 %
RV: 2.74 L
TLC % PRED: 103 %
TLC: 7.94 L

## 2013-10-01 NOTE — Progress Notes (Signed)
Echo performed. 

## 2013-10-01 NOTE — Progress Notes (Signed)
PFT done today. 

## 2013-10-04 ENCOUNTER — Encounter: Payer: Self-pay | Admitting: Cardiovascular Disease

## 2013-10-04 ENCOUNTER — Ambulatory Visit (INDEPENDENT_AMBULATORY_CARE_PROVIDER_SITE_OTHER): Payer: Medicare Other | Admitting: Cardiovascular Disease

## 2013-10-04 ENCOUNTER — Ambulatory Visit (INDEPENDENT_AMBULATORY_CARE_PROVIDER_SITE_OTHER): Payer: Medicare Other | Admitting: *Deleted

## 2013-10-04 VITALS — BP 136/86 | HR 61 | Ht 73.0 in | Wt 227.0 lb

## 2013-10-04 DIAGNOSIS — I4891 Unspecified atrial fibrillation: Secondary | ICD-10-CM | POA: Diagnosis not present

## 2013-10-04 DIAGNOSIS — R0602 Shortness of breath: Secondary | ICD-10-CM

## 2013-10-04 DIAGNOSIS — I2510 Atherosclerotic heart disease of native coronary artery without angina pectoris: Secondary | ICD-10-CM | POA: Diagnosis not present

## 2013-10-04 DIAGNOSIS — Z7901 Long term (current) use of anticoagulants: Secondary | ICD-10-CM

## 2013-10-04 DIAGNOSIS — Z954 Presence of other heart-valve replacement: Secondary | ICD-10-CM

## 2013-10-04 LAB — BRAIN NATRIURETIC PEPTIDE: Pro B Natriuretic peptide (BNP): 56 pg/mL (ref 0.0–100.0)

## 2013-10-04 LAB — T3, FREE: T3 FREE: 2.8 pg/mL (ref 2.3–4.2)

## 2013-10-04 LAB — POCT INR: INR: 2.1

## 2013-10-04 LAB — T4, FREE: Free T4: 0.95 ng/dL (ref 0.60–1.60)

## 2013-10-04 LAB — TSH: TSH: 2.6 u[IU]/mL (ref 0.35–4.50)

## 2013-10-04 MED ORDER — NITROGLYCERIN 0.4 MG SL SUBL: 0.4000 mg | SUBLINGUAL_TABLET | SUBLINGUAL | Status: DC | PRN

## 2013-10-04 NOTE — Patient Instructions (Addendum)
Your physician recommends that you have lab work today: TSH, Free T4, Free T3 and BNP  Your physician has requested that you have an exercise stress myoview. For further information please visit [email or web address]. Please follow instruction sheet, as given.  Your physician has recommended you make the following change in your medication: Continue to hold Amiodarone and Aspirin (removed from your medication list)

## 2013-10-06 ENCOUNTER — Encounter: Payer: Self-pay | Admitting: Cardiovascular Disease

## 2013-10-06 NOTE — Progress Notes (Signed)
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  He underwent aortic valve replacement with a 25 mm Edwards pericardial bioprosthesis and left-sided radiofrequency cryo- Cox maze procedure with ligation of the left atrial appendage in January 2013. He's had recurrence of atrial fibrillation and required amiodarone.  And he has recently complained of worsening shortness of breath with activity. He has marked shortness of breath with bending forward. There have been concerns about amiodarone lung effects and he quit taking this medicine about 3 days ago. He also admits to occasional chest tightness, but this has been a long-standing issue. He denies edema, orthopnea, or PND. An echocardiogram showed normal LV systolic function and normal function of his aortic valve bioprosthesis. He was noted to have diastolic dysfunction. He has a chronically elevated sedimentation rate has been on and off of steroids. He's also had problems recently with labile blood pressure readings.   Outpatient Encounter Prescriptions as of 10/04/2013  Medication Sig  . bisacodyl (DULCOLAX) 5 MG EC tablet Take 5 mg by mouth daily as needed.  . butalbital-acetaminophen-caffeine (FIORICET, ESGIC) 50-325-40 MG per tablet Take 1 tablet by mouth 2 (two) times daily.   . Coenzyme Q10 (COQ10) 100 MG CAPS Take 100 mg by mouth daily.  . Diclofenac Sodium 1.5 % SOLN Take 1 application by mouth as needed.  . doxazosin (CARDURA) 4 MG tablet Take 4 mg by mouth daily.  . fish oil-omega-3 fatty acids 1000 MG capsule Take 1 g by mouth as needed.   . hydrochlorothiazide (HYDRODIURIL) 25 MG tablet ON HOLD STARTING 07/27/13  . HYDROcodone-acetaminophen (NORCO/VICODIN) 5-325 MG per tablet Take 1 tablet by mouth 3 (three) times daily.  Marland Kitchen levothyroxine  (SYNTHROID, LEVOTHROID) 50 MCG tablet Take 50 mcg by mouth daily.    Marland Kitchen LORazepam (ATIVAN) 1 MG tablet Take one tablet by mouth at bedtime. For anxiety  . metoprolol succinate (TOPROL-XL) 50 MG 24 hr tablet Take 1/2 tablet (25 mg) by mouth daily  . nitroGLYCERIN (NITROSTAT) 0.4 MG SL tablet Place 1 tablet (0.4 mg total) under the tongue every 5 (five) minutes as needed for chest pain.  . potassium chloride SA (K-DUR,KLOR-CON) 20 MEQ tablet Take 1 tablet (20 mEq total) by mouth daily.  . rosuvastatin (CRESTOR) 20 MG tablet Take 20 mg by mouth daily.  Marland Kitchen warfarin (COUMADIN) 4 MG tablet TAKE 1 TABLET BY MOUTH ONCE DAILY  . [DISCONTINUED] amiodarone (PACERONE) 200 MG tablet Take 0.5-1 tablets (100-200 mg total) by mouth daily.  . [DISCONTINUED] metoprolol succinate (TOPROL-XL) 50 MG 24 hr tablet ON HOLD STARTING 07/27/13-----Take 1/2 tablet (25 mg) by mouth daily  . [DISCONTINUED] nitroGLYCERIN (NITROSTAT) 0.4 MG SL tablet Place 1 tablet (0.4 mg total) under the tongue every 5 (five) minutes as needed for chest pain.    Allergies  Allergen Reactions  . Contrast Media [Iodinated Diagnostic Agents] Hives    Past Medical History  Diagnosis Date  . Chronotropic incompetence with sinus node dysfunction     Status post Guidant dual-mode, dual-pacing, dual-sensing  pacemaker   implantation now programmed to AAI with recent generator change.  . Coronary artery disease     status post multiple prior percutaneous coronary interventions, microvascular angina per Dr Olevia Perches  . Aortic stenosis     moderate aortic stenosis  . Hyperlipidemia   . Hypercoagulable state  chronically anticoagulated with coumadin  . Heart murmur   . Stroke     1990  . Hyperthyroidism   . Benign prostatic hypertrophy   . Arthritis   . Paroxysmal atrial fibrillation     DR. STuckey,   . Hypothyroidism     Dr. Elyse Hsu  . MGUS (monoclonal gammopathy of unknown significance) 02/17/2013    ROS: Negative except as per  HPI  BP 136/86  Pulse 61  Ht 6\' 1"  (1.854 m)  Wt 227 lb (102.967 kg)  BMI 29.96 kg/m2  SpO2 93%  PHYSICAL EXAM: Pt is alert and oriented, NAD HEENT: normal Neck: JVP - normal, carotids 2+= without bruits Lungs: CTA bilaterally CV: RRR with grade 2/6 systolic ejection murmur at the left sternal border Abd: soft, NT, Positive BS, no hepatomegaly Ext: no C/C/E, distal pulses intact and equal Skin: warm/dry no rash  2D Echo: Study Conclusions  - Left ventricle: The cavity size was normal. There was mild concentric hypertrophy. Systolic function was normal. The estimated ejection fraction was in the range of 55% to 60%. Wall motion was normal; there were no regional wall motion abnormalities. Features are consistent with a pseudonormal left ventricular filling pattern, with concomitant abnormal relaxation and increased filling pressure (grade 2 diastolic dysfunction). - Aortic valve: A bioprosthesis was present and functioning normally. Peak velocity (S): 228 cm/s. - Aorta: Ascending aortic diameter: 41 mm (S). - Mitral valve: Calcified annulus. Mildly thickened leaflets . There was mild regurgitation. - Left atrium: The atrium was moderately to severely dilated. Anterior-posterior dimension: 55 mm. - Right atrium: The atrium was mildly dilated.  Impressions:  - There is no significant change in bioprosthetic aortic valve when compared to prior (2013).  ASSESSMENT AND PLAN: 1. Progressive shortness of breath. Etiology is unclear, but considerations include amiodarone lung toxicity, progressive ischemic heart disease, diastolic dysfunction, and cardiac arrhythmia/chronotropic incompetence. His echocardiogram is reassuring. He has normal bioprosthetic valve function and normal LV systolic function. I checked a BNP this morning which is also normal. PFTs were reviewed and has reduced diffusion capacity compared to his baseline. Amiodarone has been stopped, and he clearly will be  at higher risk of atrial fibrillation considering his past history and severe atrial enlargement. Will arrange followup with Dr. Lovena Le. I'm also going to check an exercise Myoview scan to evaluate for ischemia. Thyroid function was also normal based on this morning's labs.  2. Coronary artery disease, native vessel. His chronic angina is essentially unchanged. As above he has progressive dyspnea. Will check a Myoview scan to evaluate for ischemia.  3. Paroxysmal atrial fibrillation. Anticoagulated with warfarin. Followup will be arranged with Dr. Lovena Le. He is now off of amiodarone.  4. Labile hypertension. Would continue same medications for now. Hopefully discontinuation of amiodarone will help as it can be associated with autonomic dysfunction.  Sherren Mocha MD 10/06/2013 7:53 AM

## 2013-10-16 ENCOUNTER — Ambulatory Visit (HOSPITAL_COMMUNITY): Payer: Medicare Other | Attending: Cardiology | Admitting: Radiology

## 2013-10-16 VITALS — BP 139/80 | HR 60 | Ht 73.0 in | Wt 230.0 lb

## 2013-10-16 DIAGNOSIS — R079 Chest pain, unspecified: Secondary | ICD-10-CM

## 2013-10-16 DIAGNOSIS — I10 Essential (primary) hypertension: Secondary | ICD-10-CM | POA: Insufficient documentation

## 2013-10-16 DIAGNOSIS — R42 Dizziness and giddiness: Secondary | ICD-10-CM | POA: Diagnosis not present

## 2013-10-16 DIAGNOSIS — Z8249 Family history of ischemic heart disease and other diseases of the circulatory system: Secondary | ICD-10-CM | POA: Diagnosis not present

## 2013-10-16 DIAGNOSIS — R0609 Other forms of dyspnea: Secondary | ICD-10-CM | POA: Insufficient documentation

## 2013-10-16 DIAGNOSIS — I4891 Unspecified atrial fibrillation: Secondary | ICD-10-CM | POA: Diagnosis not present

## 2013-10-16 DIAGNOSIS — R002 Palpitations: Secondary | ICD-10-CM | POA: Diagnosis not present

## 2013-10-16 DIAGNOSIS — I2510 Atherosclerotic heart disease of native coronary artery without angina pectoris: Secondary | ICD-10-CM | POA: Diagnosis not present

## 2013-10-16 DIAGNOSIS — R0989 Other specified symptoms and signs involving the circulatory and respiratory systems: Secondary | ICD-10-CM | POA: Diagnosis not present

## 2013-10-16 DIAGNOSIS — Z9861 Coronary angioplasty status: Secondary | ICD-10-CM | POA: Insufficient documentation

## 2013-10-16 DIAGNOSIS — R0602 Shortness of breath: Secondary | ICD-10-CM | POA: Diagnosis not present

## 2013-10-16 DIAGNOSIS — R0789 Other chest pain: Secondary | ICD-10-CM | POA: Diagnosis not present

## 2013-10-16 MED ORDER — TECHNETIUM TC 99M SESTAMIBI GENERIC - CARDIOLITE
30.0000 | Freq: Once | INTRAVENOUS | Status: AC | PRN
  Administered 2013-10-16: 30 via INTRAVENOUS

## 2013-10-16 MED ORDER — TECHNETIUM TC 99M SESTAMIBI GENERIC - CARDIOLITE
10.0000 | Freq: Once | INTRAVENOUS | Status: AC | PRN
  Administered 2013-10-16: 10 via INTRAVENOUS

## 2013-10-16 NOTE — Progress Notes (Addendum)
[facility] [address] [place], [place] [address] [phone]    Cardiology Nuclear Med Study  [name] is a 79 y.o. male     MRN : [id number]     DOB: 1934/12/17  Procedure Date: 10/16/2013  Nuclear Med Background Indication for Stress Test:  Evaluation for Ischemia and Stent Patency History:  CAD, Stents, AVR, PTVP, AFIB, '08  Myocardial Perfusion Imaging-Normal, EF=66%, 09-2013 Echo: EF=55-60% Cardiac Risk Factors: CVA, Family History - CAD, Hypertension and Lipids  Symptoms:Chest Tightness with exertion (last occurrence 2 weeks ago),  Dizziness, DOE and Palpitations   Nuclear Pre-Procedure Caffeine/Decaff Intake:  None> 12 hrs NPO After: 8:30pm   Lungs:  clear O2 Sat: 95%% on room air. IV 0.9% NS with Angio Cath:  22g  IV Site: R Antecubital x 1, tolerated well IV Started by:  Irven Baltimore, RN  Chest Size (in):  44 Cup Size: n/a  Height: 6\' 1"  (1.854 m)  Weight:  230 lb (104.327 kg)  BMI:  Body mass index is 30.35 kg/(m^2). Tech Comments:  Patient held Toprol x 24 hrs. Irven Baltimore, RN.    Nuclear Med Study 1 or 2 day study: 1 day  Stress Test Type:  Stress  Reading MD: N/A  Order Authorizing Provider:  Sherren Mocha, MD  Resting Radionuclide: Technetium 50m Sestamibi  Resting Radionuclide Dose: 11.0 mCi   Stress Radionuclide:  Technetium 50m Sestamibi  Stress Radionuclide Dose: 33.0 mCi           Stress Protocol Rest HR: 60 Stress HR: 123  Rest BP: 139/80 Stress BP: 179/66  Exercise Time (min): 9:57 METS: 11.6   Predicted Max HR: 143 bpm % Max HR: 86.01 bpm Rate Pressure Product: 22017   Dose of Adenosine (mg):  n/a Dose of Lexiscan: n/a mg  Dose of Atropine (mg): n/a Dose of Dobutamine: n/a mcg/kg/min (at max HR)  Stress Test Technologist: Irven Baltimore, RN  Nuclear Technologist:  Vedia Pereyra, CNMT     Rest Procedure:  Myocardial perfusion imaging was performed at rest 45 minutes following the intravenous  administration of Technetium 63m Sestamibi. Rest ECG: Atrial Fibrilliation with NS ST abn.   RBBB  Stress Procedure:  The patient exercised on the treadmill utilizing the Bruce Protocol for 9:57 minutes, RPE. The patient stopped due to DOE and denied any chest pain. Technetium 31m Sestamibi was injected at peak exercise and myocardial perfusion imaging was performed after a brief delay. Stress ECG: No significant change from baseline ECG  QPS Raw Data Images:  Normal; no motion artifact; normal heart/lung ratio. Stress Images:  Normal homogeneous uptake in all areas of the myocardium. Rest Images:  Normal homogeneous uptake in all areas of the myocardium. Subtraction (SDS):  No evidence of ischemia. Transient Ischemic Dilatation (Normal <1.22):  0.92 Lung/Heart Ratio (Normal <0.45):  0.30  Quantitative Gated Spect Images QGS EDV:  108 ml QGS ESV:  47 ml  Impression Exercise Capacity:  Good exercise capacity. BP Response:  Normal blood pressure response. Clinical Symptoms:  No significant symptoms noted. ECG Impression:  No significant ST segment change suggestive of ischemia. Comparison with Prior Nuclear Study: No images to compare  Overall Impression:  Normal stress nuclear study.  No evidence of ischemia.  Normal LV function.   LV Ejection Fraction: 57%.  LV Wall Motion:  NL LV Function; NL Wall Motion.  I think that the actual LV function is better than the computer generated 57%.   [name]  [name]., MD, [facility] 10/16/2013, 3:39 PM [address],  [place] Pager [phone]

## 2013-11-07 ENCOUNTER — Ambulatory Visit (INDEPENDENT_AMBULATORY_CARE_PROVIDER_SITE_OTHER): Payer: Medicare Other | Admitting: Pharmacist

## 2013-11-07 DIAGNOSIS — I4891 Unspecified atrial fibrillation: Secondary | ICD-10-CM

## 2013-11-07 DIAGNOSIS — Z7901 Long term (current) use of anticoagulants: Secondary | ICD-10-CM

## 2013-11-07 DIAGNOSIS — M19049 Primary osteoarthritis, unspecified hand: Secondary | ICD-10-CM | POA: Diagnosis not present

## 2013-11-07 DIAGNOSIS — Z0389 Encounter for observation for other suspected diseases and conditions ruled out: Secondary | ICD-10-CM | POA: Diagnosis not present

## 2013-11-07 DIAGNOSIS — Z954 Presence of other heart-valve replacement: Secondary | ICD-10-CM

## 2013-11-07 DIAGNOSIS — M1710 Unilateral primary osteoarthritis, unspecified knee: Secondary | ICD-10-CM | POA: Diagnosis not present

## 2013-11-07 DIAGNOSIS — M25519 Pain in unspecified shoulder: Secondary | ICD-10-CM | POA: Diagnosis not present

## 2013-11-07 LAB — POCT INR: INR: 1.4

## 2013-11-15 ENCOUNTER — Ambulatory Visit (INDEPENDENT_AMBULATORY_CARE_PROVIDER_SITE_OTHER): Payer: Medicare Other | Admitting: Pharmacist

## 2013-11-15 DIAGNOSIS — I4891 Unspecified atrial fibrillation: Secondary | ICD-10-CM

## 2013-11-15 DIAGNOSIS — Z7901 Long term (current) use of anticoagulants: Secondary | ICD-10-CM

## 2013-11-15 DIAGNOSIS — Z954 Presence of other heart-valve replacement: Secondary | ICD-10-CM | POA: Diagnosis not present

## 2013-11-15 LAB — POCT INR: INR: 1.6

## 2013-11-22 ENCOUNTER — Other Ambulatory Visit: Payer: Self-pay | Admitting: Oncology

## 2013-11-22 DIAGNOSIS — D472 Monoclonal gammopathy: Secondary | ICD-10-CM

## 2013-11-23 ENCOUNTER — Ambulatory Visit (INDEPENDENT_AMBULATORY_CARE_PROVIDER_SITE_OTHER): Payer: Medicare Other

## 2013-11-23 DIAGNOSIS — Z7901 Long term (current) use of anticoagulants: Secondary | ICD-10-CM

## 2013-11-23 DIAGNOSIS — I4891 Unspecified atrial fibrillation: Secondary | ICD-10-CM

## 2013-11-23 DIAGNOSIS — Z954 Presence of other heart-valve replacement: Secondary | ICD-10-CM | POA: Diagnosis not present

## 2013-11-23 LAB — POCT INR: INR: 1.5

## 2013-11-26 ENCOUNTER — Encounter: Payer: Self-pay | Admitting: Oncology

## 2013-11-26 ENCOUNTER — Encounter: Payer: Medicare Other | Admitting: Oncology

## 2013-11-26 ENCOUNTER — Ambulatory Visit (INDEPENDENT_AMBULATORY_CARE_PROVIDER_SITE_OTHER): Payer: Medicare Other | Admitting: Oncology

## 2013-11-26 ENCOUNTER — Other Ambulatory Visit: Payer: Medicare Other

## 2013-11-26 VITALS — BP 156/82 | HR 62 | Temp 97.8°F | Resp 20 | Ht 73.5 in | Wt 218.0 lb

## 2013-11-26 DIAGNOSIS — T50904A Poisoning by unspecified drugs, medicaments and biological substances, undetermined, initial encounter: Secondary | ICD-10-CM | POA: Diagnosis not present

## 2013-11-26 DIAGNOSIS — E032 Hypothyroidism due to medicaments and other exogenous substances: Secondary | ICD-10-CM

## 2013-11-26 DIAGNOSIS — R7309 Other abnormal glucose: Secondary | ICD-10-CM | POA: Diagnosis not present

## 2013-11-26 DIAGNOSIS — I4891 Unspecified atrial fibrillation: Secondary | ICD-10-CM | POA: Diagnosis not present

## 2013-11-26 DIAGNOSIS — N400 Enlarged prostate without lower urinary tract symptoms: Secondary | ICD-10-CM | POA: Diagnosis not present

## 2013-11-26 DIAGNOSIS — M899 Disorder of bone, unspecified: Secondary | ICD-10-CM

## 2013-11-26 DIAGNOSIS — E038 Other specified hypothyroidism: Secondary | ICD-10-CM

## 2013-11-26 DIAGNOSIS — E785 Hyperlipidemia, unspecified: Secondary | ICD-10-CM | POA: Diagnosis not present

## 2013-11-26 DIAGNOSIS — D472 Monoclonal gammopathy: Secondary | ICD-10-CM | POA: Diagnosis not present

## 2013-11-26 DIAGNOSIS — M949 Disorder of cartilage, unspecified: Secondary | ICD-10-CM

## 2013-11-26 DIAGNOSIS — I2510 Atherosclerotic heart disease of native coronary artery without angina pectoris: Secondary | ICD-10-CM

## 2013-11-26 DIAGNOSIS — M8580 Other specified disorders of bone density and structure, unspecified site: Secondary | ICD-10-CM

## 2013-11-26 LAB — CBC WITH DIFFERENTIAL/PLATELET
BASOS ABS: 0 10*3/uL (ref 0.0–0.1)
Basophils Relative: 0 % (ref 0–1)
EOS PCT: 2 % (ref 0–5)
Eosinophils Absolute: 0.1 10*3/uL (ref 0.0–0.7)
HEMATOCRIT: 37.4 % — AB (ref 39.0–52.0)
Hemoglobin: 12.6 g/dL — ABNORMAL LOW (ref 13.0–17.0)
Lymphocytes Relative: 12 % (ref 12–46)
Lymphs Abs: 0.7 10*3/uL (ref 0.7–4.0)
MCH: 29.5 pg (ref 26.0–34.0)
MCHC: 33.7 g/dL (ref 30.0–36.0)
MCV: 87.6 fL (ref 78.0–100.0)
MONO ABS: 0.3 10*3/uL (ref 0.1–1.0)
Monocytes Relative: 6 % (ref 3–12)
Neutro Abs: 4.5 10*3/uL (ref 1.7–7.7)
Neutrophils Relative %: 80 % — ABNORMAL HIGH (ref 43–77)
Platelets: 237 10*3/uL (ref 150–400)
RBC: 4.27 MIL/uL (ref 4.22–5.81)
RDW: 15 % (ref 11.5–15.5)
WBC: 5.6 10*3/uL (ref 4.0–10.5)

## 2013-11-26 LAB — COMPREHENSIVE METABOLIC PANEL
ALT: 15 U/L (ref 0–53)
AST: 18 U/L (ref 0–37)
Albumin: 4.4 g/dL (ref 3.5–5.2)
Alkaline Phosphatase: 61 U/L (ref 39–117)
BUN: 14 mg/dL (ref 6–23)
CO2: 27 mEq/L (ref 19–32)
CREATININE: 0.88 mg/dL (ref 0.50–1.35)
Calcium: 9.6 mg/dL (ref 8.4–10.5)
Chloride: 104 mEq/L (ref 96–112)
Glucose, Bld: 121 mg/dL — ABNORMAL HIGH (ref 70–99)
POTASSIUM: 4.1 meq/L (ref 3.5–5.3)
Sodium: 140 mEq/L (ref 135–145)
Total Bilirubin: 0.4 mg/dL (ref 0.2–1.2)
Total Protein: 7 g/dL (ref 6.0–8.3)

## 2013-11-26 LAB — LIPID PANEL
Cholesterol: 150 mg/dL (ref 0–200)
HDL: 59 mg/dL (ref >39)
LDL CALC: 69 mg/dL (ref 0–99)
TRIGLYCERIDES: 112 mg/dL (ref <150)
Total CHOL/HDL Ratio: 2.5 Ratio
VLDL: 22 mg/dL (ref 0–40)

## 2013-11-26 LAB — THYROID PANEL WITH TSH
FREE THYROXINE INDEX: 1.5 (ref 1.4–3.8)
T3 Uptake: 29 % (ref 22.0–35.0)
T4, Total: 5.1 ug/dL (ref 4.5–12.0)
TSH: 3.898 u[IU]/mL (ref 0.350–4.500)

## 2013-11-26 LAB — MAGNESIUM: Magnesium: 2.3 mg/dL (ref 1.5–2.5)

## 2013-11-26 NOTE — Patient Instructions (Signed)
To lab today Return visit 6 months  Lab 1 week before: CBC, IgM, Cmet

## 2013-11-26 NOTE — Progress Notes (Signed)
Patient ID: [name], male   DOB: 06/02/1934, 79 y.o.   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 IgM level initially done on 01/31/2013 was 494 mg percent (41-251) with normal IgG and IgA. No monoclonal spike in the gamma region. Repeat profile done on 04/05/2013 with IgM 517 mg percent. Immunofixation electrophoresis showed monoclonal lambda restriction. Hemoglobin 13.8. Normal white count and platelet count. Serum albumin 4.3 with total protein 7.6, BUN 18, creatinine 1.1, calcium 9.7. A chest radiograph showed changes from previous pacemaker implant. Other postoperative changes from previous median sternotomy and aortic valvuloplasty. I did not get a bone survey. He had no neurologic signs or symptoms. We discussed a differential diagnosis including benign monoclonal gammopathy, versus early Waldonstrom's macroglobulinemia (well-differentiated B-cell lymphocytic lymphoma), or IgM multiple myeloma. We discussed whether or not a bone marrow would add any insight into the process. Although it might confirm a diagnosis of Waldenstrm's, with a near normal hemoglobin, initial treatment  would be observation alone and we mutually decided to defer a bone marrow procedure at that time.  He has had no interim medical problems. He has travel quite extensively since his visit with me. He has an upcoming trip to [place].  He denied any new neurologic symptoms. 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 He denied any paresthesias.  We spent most of the time talking about current events, work history, and travel. I did not give him a formal physical exam today. Lab studies are pending.  Impression:  #1. Elevated IgM paraprotein in the absence of a significant anemia or any neurologic signs or symptoms.

## 2013-11-27 LAB — IGG, IGA, IGM
IGA: 168 mg/dL (ref 68–379)
IGG (IMMUNOGLOBIN G), SERUM: 862 mg/dL (ref 650–1600)
IGM, SERUM: 551 mg/dL — AB (ref 41–251)

## 2013-11-27 LAB — HEMOGLOBIN A1C
Hgb A1c MFr Bld: 5.8 % — ABNORMAL HIGH (ref <5.7)
Mean Plasma Glucose: 120 mg/dL — ABNORMAL HIGH (ref <117)

## 2013-11-27 LAB — SEDIMENTATION RATE: Sed Rate: 42 mm/hr — ABNORMAL HIGH (ref 0–16)

## 2013-11-27 LAB — PSA: PSA: 2.52 ng/mL (ref <=4.00)

## 2013-12-02 DIAGNOSIS — D6851 Activated protein C resistance: Secondary | ICD-10-CM | POA: Diagnosis not present

## 2013-12-02 LAB — POCT INR: INR: 1.4

## 2013-12-03 ENCOUNTER — Ambulatory Visit (INDEPENDENT_AMBULATORY_CARE_PROVIDER_SITE_OTHER): Payer: Medicare Other | Admitting: Cardiovascular Disease

## 2013-12-03 DIAGNOSIS — I4891 Unspecified atrial fibrillation: Secondary | ICD-10-CM

## 2013-12-05 ENCOUNTER — Ambulatory Visit (INDEPENDENT_AMBULATORY_CARE_PROVIDER_SITE_OTHER): Payer: Medicare Other | Admitting: Pharmacist

## 2013-12-05 DIAGNOSIS — M25561 Pain in right knee: Secondary | ICD-10-CM | POA: Diagnosis not present

## 2013-12-05 DIAGNOSIS — M1711 Unilateral primary osteoarthritis, right knee: Secondary | ICD-10-CM | POA: Diagnosis not present

## 2013-12-05 DIAGNOSIS — Z7901 Long term (current) use of anticoagulants: Secondary | ICD-10-CM | POA: Diagnosis not present

## 2013-12-05 DIAGNOSIS — I4891 Unspecified atrial fibrillation: Secondary | ICD-10-CM | POA: Diagnosis not present

## 2013-12-05 DIAGNOSIS — Z954 Presence of other heart-valve replacement: Secondary | ICD-10-CM | POA: Diagnosis not present

## 2013-12-05 DIAGNOSIS — Z952 Presence of prosthetic heart valve: Secondary | ICD-10-CM

## 2013-12-05 LAB — POCT INR: INR: 2.3

## 2013-12-13 ENCOUNTER — Other Ambulatory Visit: Payer: Self-pay

## 2013-12-17 DIAGNOSIS — Z23 Encounter for immunization: Secondary | ICD-10-CM | POA: Diagnosis not present

## 2013-12-18 DIAGNOSIS — Z23 Encounter for immunization: Secondary | ICD-10-CM | POA: Diagnosis not present

## 2013-12-18 DIAGNOSIS — E038 Other specified hypothyroidism: Secondary | ICD-10-CM | POA: Diagnosis not present

## 2013-12-26 ENCOUNTER — Ambulatory Visit (INDEPENDENT_AMBULATORY_CARE_PROVIDER_SITE_OTHER): Payer: Medicare Other | Admitting: *Deleted

## 2013-12-26 DIAGNOSIS — Z952 Presence of prosthetic heart valve: Secondary | ICD-10-CM

## 2013-12-26 DIAGNOSIS — I4891 Unspecified atrial fibrillation: Secondary | ICD-10-CM | POA: Diagnosis not present

## 2013-12-26 DIAGNOSIS — Z7901 Long term (current) use of anticoagulants: Secondary | ICD-10-CM | POA: Diagnosis not present

## 2013-12-26 DIAGNOSIS — Z954 Presence of other heart-valve replacement: Secondary | ICD-10-CM

## 2013-12-26 LAB — POCT INR: INR: 1.9

## 2014-01-07 ENCOUNTER — Other Ambulatory Visit: Payer: Self-pay | Admitting: Pharmacist

## 2014-01-07 MED ORDER — WARFARIN SODIUM 4 MG PO TABS: ORAL_TABLET | ORAL | Status: DC

## 2014-01-09 ENCOUNTER — Encounter: Payer: Self-pay | Admitting: Internal Medicine

## 2014-01-09 DIAGNOSIS — M1711 Unilateral primary osteoarthritis, right knee: Secondary | ICD-10-CM | POA: Diagnosis not present

## 2014-01-11 ENCOUNTER — Ambulatory Visit (INDEPENDENT_AMBULATORY_CARE_PROVIDER_SITE_OTHER): Payer: Medicare Other | Admitting: Pharmacist

## 2014-01-11 DIAGNOSIS — Z7901 Long term (current) use of anticoagulants: Secondary | ICD-10-CM

## 2014-01-11 DIAGNOSIS — I4891 Unspecified atrial fibrillation: Secondary | ICD-10-CM

## 2014-01-11 DIAGNOSIS — Z954 Presence of other heart-valve replacement: Secondary | ICD-10-CM

## 2014-01-11 DIAGNOSIS — Z952 Presence of prosthetic heart valve: Secondary | ICD-10-CM

## 2014-01-11 LAB — POCT INR: INR: 2.8

## 2014-01-16 ENCOUNTER — Ambulatory Visit (INDEPENDENT_AMBULATORY_CARE_PROVIDER_SITE_OTHER): Payer: Medicare Other | Admitting: *Deleted

## 2014-01-16 DIAGNOSIS — Z954 Presence of other heart-valve replacement: Secondary | ICD-10-CM

## 2014-01-16 DIAGNOSIS — Z7901 Long term (current) use of anticoagulants: Secondary | ICD-10-CM

## 2014-01-16 DIAGNOSIS — I4891 Unspecified atrial fibrillation: Secondary | ICD-10-CM

## 2014-01-16 DIAGNOSIS — Z952 Presence of prosthetic heart valve: Secondary | ICD-10-CM

## 2014-01-16 LAB — POCT INR: INR: 2.4

## 2014-02-06 ENCOUNTER — Encounter (HOSPITAL_COMMUNITY): Payer: Self-pay | Admitting: Cardiology

## 2014-02-25 ENCOUNTER — Ambulatory Visit (INDEPENDENT_AMBULATORY_CARE_PROVIDER_SITE_OTHER): Payer: Medicare Other | Admitting: Cardiology

## 2014-02-25 DIAGNOSIS — I4891 Unspecified atrial fibrillation: Secondary | ICD-10-CM

## 2014-02-25 LAB — POCT INR: INR: 2.6

## 2014-03-06 DIAGNOSIS — B029 Zoster without complications: Secondary | ICD-10-CM | POA: Diagnosis not present

## 2014-04-01 DIAGNOSIS — D6851 Activated protein C resistance: Secondary | ICD-10-CM | POA: Diagnosis not present

## 2014-04-02 ENCOUNTER — Other Ambulatory Visit: Payer: Self-pay | Admitting: Internal Medicine

## 2014-04-03 DIAGNOSIS — L821 Other seborrheic keratosis: Secondary | ICD-10-CM | POA: Diagnosis not present

## 2014-04-03 DIAGNOSIS — B029 Zoster without complications: Secondary | ICD-10-CM | POA: Diagnosis not present

## 2014-04-04 ENCOUNTER — Ambulatory Visit (INDEPENDENT_AMBULATORY_CARE_PROVIDER_SITE_OTHER): Payer: Medicare Other | Admitting: *Deleted

## 2014-04-04 DIAGNOSIS — I4891 Unspecified atrial fibrillation: Secondary | ICD-10-CM | POA: Diagnosis not present

## 2014-04-04 LAB — POCT INR: INR: 2.3

## 2014-04-10 ENCOUNTER — Telehealth: Payer: Self-pay | Admitting: Internal Medicine

## 2014-04-10 DIAGNOSIS — R5383 Other fatigue: Secondary | ICD-10-CM | POA: Diagnosis not present

## 2014-04-10 DIAGNOSIS — Z125 Encounter for screening for malignant neoplasm of prostate: Secondary | ICD-10-CM | POA: Diagnosis not present

## 2014-04-10 DIAGNOSIS — E038 Other specified hypothyroidism: Secondary | ICD-10-CM | POA: Diagnosis not present

## 2014-04-10 NOTE — Telephone Encounter (Signed)
2/10 left vm to sched pacer check recall; ah

## 2014-04-12 DIAGNOSIS — H25819 Combined forms of age-related cataract, unspecified eye: Secondary | ICD-10-CM | POA: Diagnosis not present

## 2014-04-12 DIAGNOSIS — H524 Presbyopia: Secondary | ICD-10-CM | POA: Diagnosis not present

## 2014-04-12 DIAGNOSIS — H5202 Hypermetropia, left eye: Secondary | ICD-10-CM | POA: Diagnosis not present

## 2014-04-12 DIAGNOSIS — H5211 Myopia, right eye: Secondary | ICD-10-CM | POA: Diagnosis not present

## 2014-04-12 DIAGNOSIS — H52223 Regular astigmatism, bilateral: Secondary | ICD-10-CM | POA: Diagnosis not present

## 2014-04-12 DIAGNOSIS — H5010 Unspecified exotropia: Secondary | ICD-10-CM | POA: Diagnosis not present

## 2014-04-24 ENCOUNTER — Encounter: Payer: Self-pay | Admitting: Cardiology

## 2014-04-24 DIAGNOSIS — I4891 Unspecified atrial fibrillation: Secondary | ICD-10-CM

## 2014-04-24 NOTE — Progress Notes (Signed)
This encounter was created in error - please disregard.

## 2014-05-02 ENCOUNTER — Ambulatory Visit (INDEPENDENT_AMBULATORY_CARE_PROVIDER_SITE_OTHER): Payer: Medicare Other | Admitting: Internal Medicine

## 2014-05-02 ENCOUNTER — Other Ambulatory Visit: Payer: Self-pay

## 2014-05-02 ENCOUNTER — Encounter: Payer: Self-pay | Admitting: Internal Medicine

## 2014-05-02 ENCOUNTER — Ambulatory Visit (INDEPENDENT_AMBULATORY_CARE_PROVIDER_SITE_OTHER): Payer: Medicare Other | Admitting: *Deleted

## 2014-05-02 VITALS — BP 140/80 | HR 60 | Ht 74.0 in | Wt 220.0 lb

## 2014-05-02 DIAGNOSIS — M1712 Unilateral primary osteoarthritis, left knee: Secondary | ICD-10-CM | POA: Diagnosis not present

## 2014-05-02 DIAGNOSIS — I209 Angina pectoris, unspecified: Secondary | ICD-10-CM

## 2014-05-02 DIAGNOSIS — Z954 Presence of other heart-valve replacement: Secondary | ICD-10-CM

## 2014-05-02 DIAGNOSIS — Z950 Presence of cardiac pacemaker: Secondary | ICD-10-CM

## 2014-05-02 DIAGNOSIS — Z7901 Long term (current) use of anticoagulants: Secondary | ICD-10-CM

## 2014-05-02 DIAGNOSIS — M25562 Pain in left knee: Secondary | ICD-10-CM | POA: Diagnosis not present

## 2014-05-02 DIAGNOSIS — Z952 Presence of prosthetic heart valve: Secondary | ICD-10-CM

## 2014-05-02 DIAGNOSIS — I498 Other specified cardiac arrhythmias: Secondary | ICD-10-CM | POA: Diagnosis not present

## 2014-05-02 DIAGNOSIS — I4891 Unspecified atrial fibrillation: Secondary | ICD-10-CM

## 2014-05-02 DIAGNOSIS — I495 Sick sinus syndrome: Secondary | ICD-10-CM

## 2014-05-02 LAB — MDC_IDC_ENUM_SESS_TYPE_INCLINIC
Date Time Interrogation Session: 20160303050000
Implantable Pulse Generator Serial Number: 567685
Lead Channel Impedance Value: 530 Ohm
Lead Channel Pacing Threshold Amplitude: 1.4 V
Lead Channel Pacing Threshold Pulse Width: 0.9 ms
Lead Channel Pacing Threshold Pulse Width: 1 ms
Lead Channel Sensing Intrinsic Amplitude: 1.5 mV
Lead Channel Sensing Intrinsic Amplitude: 7.8 mV
Lead Channel Setting Pacing Amplitude: 3.5 V
MDC IDC MSMT LEADCHNL RA IMPEDANCE VALUE: 520 Ohm
MDC IDC MSMT LEADCHNL RA PACING THRESHOLD AMPLITUDE: 2.5 V
MDC IDC SET LEADCHNL RV PACING AMPLITUDE: 2.8 V
MDC IDC SET LEADCHNL RV PACING PULSEWIDTH: 0.9 ms
MDC IDC STAT BRADY RA PERCENT PACED: 92 %
MDC IDC STAT BRADY RV PERCENT PACED: 0 %

## 2014-05-02 LAB — POCT INR: INR: 2.1

## 2014-05-02 NOTE — Assessment & Plan Note (Signed)
His [employer] scientific DDD PM is programmed AAIR and is working normally. Will follow.

## 2014-05-02 NOTE — Assessment & Plan Note (Signed)
His symptoms are well controlled. Will follow.

## 2014-05-02 NOTE — Progress Notes (Signed)
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 The patient has an extensive cardiac history with long-standing hypertension and sinus node dysfunction, status post pacemaker insertion, PAF, and is s/p AVR for aortic stenosis. He has a history of microvascular coronary disease and epicardial coronary disease as well. The patient had maintained sinus rhythm on amiodarone therapy. He developed thyroid dysfunction and possible pulmonary toxicity and his amio has been stopped for 8 months. He has not had syncope. He denies chest pain. He notes that his palpitations have worsened and he is experiencing more atrial fib which makes him more sob.  Allergies  Allergen Reactions  . Contrast Media [Iodinated Diagnostic Agents] Hives     Current Outpatient Prescriptions  Medication Sig Dispense Refill  . bisacodyl (DULCOLAX) 5 MG EC tablet Take 5 mg by mouth daily as needed (constipation).     . butalbital-acetaminophen-caffeine (FIORICET, ESGIC) 50-325-40 MG per tablet Take 1 tablet by mouth 2 (two) times daily.     . Coenzyme Q10 (COQ10) 100 MG CAPS Take 100 mg by mouth daily.    . Diclofenac Sodium 1.5 % SOLN Take 1 application by mouth daily as needed (pain).     Marland Kitchen doxazosin (CARDURA) 4 MG tablet Take 4 mg by mouth daily.    . fish oil-omega-3 fatty acids 1000 MG capsule Take 1 g by mouth as directed.     Marland Kitchen HYDROcodone-acetaminophen (NORCO) 10-325 MG per tablet Take 0.5-1 tablet by mouth twice a day    . levothyroxine (SYNTHROID, LEVOTHROID) 50 MCG tablet Take 25 mcg by mouth daily.     Marland Kitchen LORazepam (ATIVAN) 1 MG tablet Take one tablet by mouth at bedtime. For anxiety    . metoprolol succinate (TOPROL-XL) 25 MG 24 hr tablet TAKE 1 TABLET BY MOUTH DAILY 180 tablet 0  . nitroGLYCERIN (NITROSTAT) 0.4 MG SL tablet Place 1 tablet (0.4 mg total) under the tongue every 5 (five) minutes as needed for chest pain. 25 tablet 3  . potassium chloride SA (K-DUR,KLOR-CON) 20 MEQ tablet Take 1 tablet (20 mEq total)  by mouth daily. 90 tablet 3  . rosuvastatin (CRESTOR) 20 MG tablet Take 20 mg by mouth daily.    Marland Kitchen warfarin (COUMADIN) 4 MG tablet TAKE 1-2 TABLETS BY MOUTH ONCE DAILY 120 tablet 1   No current facility-administered medications for this visit.     Past Medical History  Diagnosis Date  . Chronotropic incompetence with sinus node dysfunction     Status post Guidant dual-mode, dual-pacing, dual-sensing  pacemaker   implantation now programmed to AAI with recent generator change.  . Coronary artery disease     status post multiple prior percutaneous coronary interventions, microvascular angina per Dr Olevia Perches  . Aortic stenosis     moderate aortic stenosis  . Hyperlipidemia   . Hypercoagulable state     chronically anticoagulated with coumadin  . Heart murmur   . Stroke     1990  . Hyperthyroidism   . Benign prostatic hypertrophy   . Arthritis   . Paroxysmal atrial fibrillation     DR. STuckey,   . Hypothyroidism     Dr. Elyse Hsu  . MGUS (monoclonal gammopathy of unknown significance) 02/17/2013    ROS:   All systems reviewed and negative except as noted in the HPI.   Past Surgical History  Procedure Laterality Date  . Pacemaker insertion  1991    Guidant PPM, most recent Generator Change by Dr Olevia Perches was 08/22/06  . Hemrrhoidectomy    .  Cardiac catheterization      11  . Cardioversion    . Appendectomy    . Aortic valve replacement  03/15/2011    Procedure: AORTIC VALVE REPLACEMENT (AVR);  Surgeon: Gaye Pollack, MD;  Location: [facility];  Service: Open Heart Surgery;  Laterality: N/A;  . Maze  03/15/2011    Procedure: MAZE;  Surgeon: Gaye Pollack, MD;  Location: [facility];  Service: Open Heart Surgery;  Laterality: N/A;  . Tee without cardioversion  04/15/2011    Procedure: TRANSESOPHAGEAL ECHOCARDIOGRAM (TEE);  Surgeon: Loralie Champagne, MD;  Location: [facility];  Service: Cardiovascular;  Laterality: N/A;  . Cardioversion  04/15/2011    Procedure: CARDIOVERSION;  Surgeon:  Loralie Champagne, MD;  Location: [facility] ENDOSCOPY;  Service: Cardiovascular;  Laterality: N/A;  . Left and right heart catheterization with coronary angiogram Bilateral 02/01/2011    Procedure: LEFT AND RIGHT HEART CATHETERIZATION WITH CORONARY ANGIOGRAM;  Surgeon: Hillary Bow, MD;  Location: [facility] CATH LAB;  Service: Cardiovascular;  Laterality: Bilateral;     Family History  Problem Relation Age of Onset  . Heart disease Brother     Twin brother has coronary disease and recent AVR for AS  . Anesthesia problems Neg Hx   . Hypotension Neg Hx   . Malignant hyperthermia Neg Hx   . Pseudochol deficiency Neg Hx      History   Social History  . Marital Status: Married    Spouse Name: N/A  . Number of Children: N/A  . Years of Education: N/A   Occupational History  . Not on file.   Social History Main Topics  . Smoking status: Never Smoker   . Smokeless tobacco: Not on file  . Alcohol Use: No     Comment: occassional  . Drug Use: No  . Sexual Activity: Not on file   Other Topics Concern  . Not on file   Social History Narrative     BP 140/80 mmHg  Pulse 60  Ht 6\' 2"  (1.88 m)  Wt 220 lb (99.791 kg)  BMI 28.23 kg/m2  Physical Exam:  Well appearing 80 year old man, NAD HEENT: Unremarkable Neck:  6 cm JVD, no thyromegally Back:  No CVA tenderness Lungs:  Clear with no wheezes, rales, or rhonchi. HEART:  Regular brady rhythm, no murmurs, no rubs, no clicks Abd:  soft, positive bowel sounds, no organomegally, no rebound, no guarding Ext:  2 plus pulses, no edema, no cyanosis, no clubbing Skin:  No rashes no nodules Neuro:  CN II through XII intact, motor grossly intact    DEVICE  Normal device function.  See PaceArt for details.   Assess/Plan: a

## 2014-05-02 NOTE — Assessment & Plan Note (Signed)
He is pacing 100% of the time that he is not in atrial fib.

## 2014-05-02 NOTE — Patient Instructions (Signed)
Your physician wants you to follow-up in: 6 months in the device clinic and 12 months with Dr Knox Saliva will receive a reminder letter in the mail two months in advance. If you don't receive a letter, please call our office to schedule the follow-up appointment.

## 2014-05-02 NOTE — Assessment & Plan Note (Signed)
He is having more atrial fib. I have offered him a trial of Tikosyn. His QTC is 450 on my calculation but he has RBBB and a QRS of 140. He is reflecting.

## 2014-05-03 DIAGNOSIS — M25562 Pain in left knee: Secondary | ICD-10-CM | POA: Diagnosis not present

## 2014-05-03 DIAGNOSIS — M1712 Unilateral primary osteoarthritis, left knee: Secondary | ICD-10-CM | POA: Diagnosis not present

## 2014-05-09 ENCOUNTER — Other Ambulatory Visit: Payer: Self-pay

## 2014-05-09 MED ORDER — ATORVASTATIN CALCIUM 40 MG PO TABS: 40.0000 mg | ORAL_TABLET | Freq: Every day | ORAL | Status: DC

## 2014-05-10 ENCOUNTER — Encounter: Payer: Self-pay | Admitting: Internal Medicine

## 2014-05-16 ENCOUNTER — Encounter (HOSPITAL_COMMUNITY): Admission: RE | Payer: Self-pay | Source: Ambulatory Visit

## 2014-05-16 ENCOUNTER — Ambulatory Visit (HOSPITAL_COMMUNITY): Admission: RE | Admit: 2014-05-16 | Payer: Medicare Other | Source: Ambulatory Visit | Admitting: Orthopedic Surgery

## 2014-05-16 SURGERY — ARTHROSCOPY, KNEE
Anesthesia: Choice | Site: Knee | Laterality: Left

## 2014-05-27 LAB — POCT INR: INR: 2.8

## 2014-05-28 ENCOUNTER — Ambulatory Visit (INDEPENDENT_AMBULATORY_CARE_PROVIDER_SITE_OTHER): Payer: Medicare Other | Admitting: Pharmacist

## 2014-05-28 DIAGNOSIS — I4891 Unspecified atrial fibrillation: Secondary | ICD-10-CM

## 2014-06-03 ENCOUNTER — Telehealth: Payer: Self-pay | Admitting: Internal Medicine

## 2014-06-03 DIAGNOSIS — I4891 Unspecified atrial fibrillation: Secondary | ICD-10-CM

## 2014-06-03 NOTE — Telephone Encounter (Signed)
Spoke with patient and let him know he can come at 11:30 tomorrow to have the monitor placed.  Shelly aware

## 2014-06-03 NOTE — Telephone Encounter (Signed)
Discussed with Dr Lovena Le and he wants a 48 hour holter

## 2014-06-03 NOTE — Telephone Encounter (Signed)
Patient called and states Dr Lovena Le wants him to wear a 48 hour holter monitor because he says his HR's are in the low 40's

## 2014-06-03 NOTE — Telephone Encounter (Signed)
New message      Talk to [facility].  He would not tell me what he wanted

## 2014-06-04 ENCOUNTER — Encounter: Payer: Self-pay | Admitting: *Deleted

## 2014-06-04 ENCOUNTER — Encounter (INDEPENDENT_AMBULATORY_CARE_PROVIDER_SITE_OTHER): Payer: Medicare Other

## 2014-06-04 DIAGNOSIS — I4891 Unspecified atrial fibrillation: Secondary | ICD-10-CM | POA: Diagnosis not present

## 2014-06-04 NOTE — Progress Notes (Signed)
Patient ID: [name], male   DOB: [birth year], 80 y.o.   MRN: [id number] [employer] 48 hour holter monitor applied to patient.

## 2014-06-07 ENCOUNTER — Other Ambulatory Visit: Payer: Self-pay | Admitting: Cardiovascular Disease

## 2014-06-11 ENCOUNTER — Telehealth: Payer: Self-pay | Admitting: Internal Medicine

## 2014-06-11 NOTE — Telephone Encounter (Signed)
New Message     Patient is calling to speak to Dr. Lovena Le please give patient a call back.  Thanks.

## 2014-06-11 NOTE — Telephone Encounter (Addendum)
Dr Lovena Le spoke with Dr Velora Heckler in regards to his monitor.  He recommended he start Mexiletine 100 mg tid.  He will call back after he returns from the beach as he does not want to start at this point.

## 2014-06-28 ENCOUNTER — Ambulatory Visit (INDEPENDENT_AMBULATORY_CARE_PROVIDER_SITE_OTHER): Payer: Medicare Other | Admitting: *Deleted

## 2014-06-28 DIAGNOSIS — I4891 Unspecified atrial fibrillation: Secondary | ICD-10-CM

## 2014-06-28 LAB — POCT INR: INR: 2.4

## 2014-07-04 DIAGNOSIS — L820 Inflamed seborrheic keratosis: Secondary | ICD-10-CM | POA: Diagnosis not present

## 2014-07-17 ENCOUNTER — Other Ambulatory Visit: Payer: Self-pay | Admitting: Cardiovascular Disease

## 2014-08-20 ENCOUNTER — Other Ambulatory Visit: Payer: Self-pay

## 2014-08-20 MED ORDER — ROSUVASTATIN CALCIUM 20 MG PO TABS: 20.0000 mg | ORAL_TABLET | Freq: Every day | ORAL | Status: DC

## 2014-09-03 ENCOUNTER — Ambulatory Visit (INDEPENDENT_AMBULATORY_CARE_PROVIDER_SITE_OTHER): Payer: Medicare Other | Admitting: Pharmacist Clinician (PhC)/ Clinical Pharmacy Specialist

## 2014-09-03 DIAGNOSIS — M7071 Other bursitis of hip, right hip: Secondary | ICD-10-CM | POA: Diagnosis not present

## 2014-09-03 DIAGNOSIS — M5030 Other cervical disc degeneration, unspecified cervical region: Secondary | ICD-10-CM | POA: Diagnosis not present

## 2014-09-03 DIAGNOSIS — M19041 Primary osteoarthritis, right hand: Secondary | ICD-10-CM | POA: Diagnosis not present

## 2014-09-03 DIAGNOSIS — Z036 Encounter for observation for suspected toxic effect from ingested substance ruled out: Secondary | ICD-10-CM | POA: Diagnosis not present

## 2014-09-03 DIAGNOSIS — I4891 Unspecified atrial fibrillation: Secondary | ICD-10-CM

## 2014-09-03 DIAGNOSIS — M170 Bilateral primary osteoarthritis of knee: Secondary | ICD-10-CM | POA: Diagnosis not present

## 2014-09-03 LAB — POCT INR: INR: 1.9

## 2014-09-09 ENCOUNTER — Telehealth: Payer: Self-pay | Admitting: Internal Medicine

## 2014-09-09 NOTE — Telephone Encounter (Signed)
Called patient who requests for [name], RN to call him back.  [name] is aware.

## 2014-09-09 NOTE — Telephone Encounter (Signed)
New message    Patient calling would like to triage nurse to call him back. Did not disclose any information .    Aware that [facility] [employer] is off today.

## 2014-09-10 ENCOUNTER — Encounter (HOSPITAL_COMMUNITY): Payer: Self-pay | Admitting: Nurse Practitioner

## 2014-09-10 ENCOUNTER — Ambulatory Visit (HOSPITAL_COMMUNITY)
Admission: RE | Admit: 2014-09-10 | Discharge: 2014-09-10 | Disposition: A | Discharge status: Home / Self Care | Payer: Medicare Other | Source: Ambulatory Visit | Attending: Nurse Practitioner | Admitting: Nurse Practitioner

## 2014-09-10 ENCOUNTER — Other Ambulatory Visit: Payer: Self-pay

## 2014-09-10 VITALS — BP 122/74 | HR 88 | Wt 266.0 lb

## 2014-09-10 DIAGNOSIS — E785 Hyperlipidemia, unspecified: Secondary | ICD-10-CM | POA: Diagnosis not present

## 2014-09-10 DIAGNOSIS — E039 Hypothyroidism, unspecified: Secondary | ICD-10-CM | POA: Diagnosis not present

## 2014-09-10 DIAGNOSIS — D472 Monoclonal gammopathy: Secondary | ICD-10-CM | POA: Insufficient documentation

## 2014-09-10 DIAGNOSIS — Z950 Presence of cardiac pacemaker: Secondary | ICD-10-CM | POA: Insufficient documentation

## 2014-09-10 DIAGNOSIS — I4891 Unspecified atrial fibrillation: Secondary | ICD-10-CM | POA: Diagnosis not present

## 2014-09-10 DIAGNOSIS — Z953 Presence of xenogenic heart valve: Secondary | ICD-10-CM | POA: Diagnosis not present

## 2014-09-10 DIAGNOSIS — I480 Paroxysmal atrial fibrillation: Secondary | ICD-10-CM | POA: Insufficient documentation

## 2014-09-10 DIAGNOSIS — Z8673 Personal history of transient ischemic attack (TIA), and cerebral infarction without residual deficits: Secondary | ICD-10-CM | POA: Diagnosis not present

## 2014-09-10 DIAGNOSIS — I4892 Unspecified atrial flutter: Secondary | ICD-10-CM | POA: Insufficient documentation

## 2014-09-10 DIAGNOSIS — Z7901 Long term (current) use of anticoagulants: Secondary | ICD-10-CM | POA: Insufficient documentation

## 2014-09-10 DIAGNOSIS — Z91041 Radiographic dye allergy status: Secondary | ICD-10-CM | POA: Diagnosis not present

## 2014-09-10 DIAGNOSIS — I2510 Atherosclerotic heart disease of native coronary artery without angina pectoris: Secondary | ICD-10-CM | POA: Diagnosis not present

## 2014-09-10 DIAGNOSIS — Z8249 Family history of ischemic heart disease and other diseases of the circulatory system: Secondary | ICD-10-CM | POA: Insufficient documentation

## 2014-09-10 DIAGNOSIS — Z79899 Other long term (current) drug therapy: Secondary | ICD-10-CM | POA: Diagnosis not present

## 2014-09-10 LAB — BASIC METABOLIC PANEL
ANION GAP: 8 (ref 5–15)
BUN: 10 mg/dL (ref 6–20)
CO2: 29 mmol/L (ref 22–32)
Calcium: 9.3 mg/dL (ref 8.9–10.3)
Chloride: 101 mmol/L (ref 101–111)
Creatinine, Ser: 1.01 mg/dL (ref 0.61–1.24)
GFR calc non Af Amer: >60 mL/min (ref >60)
GLUCOSE: 113 mg/dL — AB (ref 65–99)
Potassium: 4 mmol/L (ref 3.5–5.1)
Sodium: 138 mmol/L (ref 135–145)

## 2014-09-10 LAB — CBC WITH DIFFERENTIAL/PLATELET
BASOS ABS: 0 10*3/uL (ref 0.0–0.1)
Basophils Relative: 0 % (ref 0–1)
EOS ABS: 0.1 10*3/uL (ref 0.0–0.7)
Eosinophils Relative: 2 % (ref 0–5)
HCT: 40.5 % (ref 39.0–52.0)
HEMOGLOBIN: 13.5 g/dL (ref 13.0–17.0)
LYMPHS ABS: 1.4 10*3/uL (ref 0.7–4.0)
LYMPHS PCT: 19 % (ref 12–46)
MCH: 29.9 pg (ref 26.0–34.0)
MCHC: 33.3 g/dL (ref 30.0–36.0)
MCV: 89.6 fL (ref 78.0–100.0)
Monocytes Absolute: 0.4 10*3/uL (ref 0.1–1.0)
Monocytes Relative: 6 % (ref 3–12)
Neutro Abs: 5.4 10*3/uL (ref 1.7–7.7)
Neutrophils Relative %: 73 % (ref 43–77)
PLATELETS: 225 10*3/uL (ref 150–400)
RBC: 4.52 MIL/uL (ref 4.22–5.81)
RDW: 12.8 % (ref 11.5–15.5)
WBC: 7.3 10*3/uL (ref 4.0–10.5)

## 2014-09-10 LAB — LIPID PANEL
CHOL/HDL RATIO: 3.4 ratio
Cholesterol: 259 mg/dL — ABNORMAL HIGH (ref 0–200)
HDL: 77 mg/dL (ref >40)
LDL Cholesterol: 159 mg/dL — ABNORMAL HIGH (ref 0–99)
Triglycerides: 113 mg/dL (ref <150)
VLDL: 23 mg/dL (ref 0–40)

## 2014-09-10 LAB — PROTIME-INR
INR: 1.8 — ABNORMAL HIGH (ref 0.00–1.49)
Prothrombin Time: 20.8 seconds — ABNORMAL HIGH (ref 11.6–15.2)

## 2014-09-10 LAB — TSH: TSH: 2.203 u[IU]/mL (ref 0.350–4.500)

## 2014-09-10 MED ORDER — ENOXAPARIN SODIUM 120 MG/0.8ML ~~LOC~~ SOLN: SUBCUTANEOUS | Status: DC

## 2014-09-10 NOTE — Progress Notes (Signed)
Patient ID: [name], male   DOB: April 15, 1934, 80 y.o.   MRN: [id number]     Primary Care Physician: Limmie Patricia, MD Referring Physician:  Dr. Burt Knack Electrophysiologist: Dr. Gladstone Lighter is a 80 y.o. male with a h/o coronary artery disease, cardiac syndrome X., aortic stenosis status post bioprosthetic aortic valve replacement, and atrial fibrillation. He underwent aortic valve replacement with a 25 mm Edwards pericardial bioprosthesis and left-sided radiofrequency cryo- Cox maze procedure with ligation of the left atrial appendage in January 2013. He's had recurrence of atrial fibrillation and required amiodarone. He stopped amiodarone several months ago due to decreased PFT results. He drank a cup of decaf coffee from [employer] and went into an irregular rhythm almost immediately and EKG now shows aflutter with variable rate. He is in clinic requesting cardioversion. INR 7/5 1.9 so he will require a TEE. He is using extra metoprolol for rate control.  Today, he denies symptoms of palpitations, chest pain, shortness of breath, orthopnea, PND, lower extremity edema, dizziness, presyncope, syncope, or neurologic sequela. The patient is tolerating medications without difficulties and is otherwise without complaint today.   Past Medical History  Diagnosis Date  . Chronotropic incompetence with sinus node dysfunction     Status post Guidant dual-mode, dual-pacing, dual-sensing  pacemaker   implantation now programmed to AAI with recent generator change.  . Coronary artery disease     status post multiple prior percutaneous coronary interventions, microvascular angina per Dr Olevia Perches  . Aortic stenosis     moderate aortic stenosis  . Hyperlipidemia   . Hypercoagulable state     chronically anticoagulated with coumadin  . Heart murmur   . Stroke     1990  . Hyperthyroidism   . Benign prostatic hypertrophy   . Arthritis   . Paroxysmal atrial fibrillation     DR.  STuckey,   . Hypothyroidism     Dr. Elyse Hsu  . MGUS (monoclonal gammopathy of unknown significance) 02/17/2013   Past Surgical History  Procedure Laterality Date  . Pacemaker insertion  1991    Guidant PPM, most recent Generator Change by Dr Olevia Perches was 08/22/06  . Hemrrhoidectomy    . Cardiac catheterization      11  . Cardioversion    . Appendectomy    . Aortic valve replacement  03/15/2011    Procedure: AORTIC VALVE REPLACEMENT (AVR);  Surgeon: Gaye Pollack, MD;  Location: [place];  Service: Open Heart Surgery;  Laterality: N/A;  . Maze  03/15/2011    Procedure: MAZE;  Surgeon: Gaye Pollack, MD;  Location: [place];  Service: Open Heart Surgery;  Laterality: N/A;  . Tee without cardioversion  04/15/2011    Procedure: TRANSESOPHAGEAL ECHOCARDIOGRAM (TEE);  Surgeon: Loralie Champagne, MD;  Location: [place];  Service: Cardiovascular;  Laterality: N/A;  . Cardioversion  04/15/2011    Procedure: CARDIOVERSION;  Surgeon: Loralie Champagne, MD;  Location: [facility] ENDOSCOPY;  Service: Cardiovascular;  Laterality: N/A;  . Left and right heart catheterization with coronary angiogram Bilateral 02/01/2011    Procedure: LEFT AND RIGHT HEART CATHETERIZATION WITH CORONARY ANGIOGRAM;  Surgeon: Hillary Bow, MD;  Location: [facility] CATH LAB;  Service: Cardiovascular;  Laterality: Bilateral;    Current Outpatient Prescriptions  Medication Sig Dispense Refill  . bisacodyl (DULCOLAX) 5 MG EC tablet Take 5 mg by mouth daily as needed (constipation).     . butalbital-acetaminophen-caffeine (FIORICET, ESGIC) 50-325-40 MG per tablet Take 1 tablet by mouth 2 (two) times  daily.     . Coenzyme Q10 (COQ10) 100 MG CAPS Take 100 mg by mouth daily.    . Diclofenac Sodium 1.5 % SOLN Take 1 application by mouth daily as needed (pain).     Marland Kitchen doxazosin (CARDURA) 4 MG tablet Take 4 mg by mouth daily.    . fish oil-omega-3 fatty acids 1000 MG capsule Take 1 g by mouth as directed.     Marland Kitchen HYDROcodone-acetaminophen (NORCO) 10-325 MG  per tablet Take 0.5-1 tablet by mouth twice a day    . levothyroxine (SYNTHROID, LEVOTHROID) 50 MCG tablet Take 25 mcg by mouth daily.     Marland Kitchen LORazepam (ATIVAN) 1 MG tablet Take one tablet by mouth at bedtime. For anxiety    . metoprolol succinate (TOPROL-XL) 25 MG 24 hr tablet TAKE 1 TABLET BY MOUTH DAILY 180 tablet 0  . nitroGLYCERIN (NITROSTAT) 0.4 MG SL tablet Place 1 tablet (0.4 mg total) under the tongue every 5 (five) minutes as needed for chest pain. 25 tablet 3  . potassium chloride SA (K-DUR,KLOR-CON) 20 MEQ tablet TAKE 1 TABLET BY MOUTH ONCE DAILY 90 tablet 1  . warfarin (COUMADIN) 4 MG tablet TAKE 1 TO 2 TABLETS BY MOUTH DAILY 150 tablet 1  . rosuvastatin (CRESTOR) 20 MG tablet Take 1 tablet (20 mg total) by mouth at bedtime. (Patient not taking: Reported on 09/10/2014) 90 tablet 3   No current facility-administered medications for this encounter.    Allergies  Allergen Reactions  . Contrast Media [Iodinated Diagnostic Agents] Hives    History   Social History  . Marital Status: Married    Spouse Name: N/A  . Number of Children: N/A  . Years of Education: N/A   Occupational History  . Not on file.   Social History Main Topics  . Smoking status: Never Smoker   . Smokeless tobacco: Not on file  . Alcohol Use: No     Comment: occassional  . Drug Use: No  . Sexual Activity: Not on file   Other Topics Concern  . Not on file   Social History Narrative    Family History  Problem Relation Age of Onset  . Heart disease Brother     Twin brother has coronary disease and recent AVR for AS  . Anesthesia problems Neg Hx   . Hypotension Neg Hx   . Malignant hyperthermia Neg Hx   . Pseudochol deficiency Neg Hx     ROS- All systems are reviewed and negative except as per the HPI above  Physical Exam: Filed Vitals:   09/10/14 1449  BP: 122/74  Pulse: 88  Weight: 266 lb (120.657 kg)    GEN- The patient is well appearing, alert and oriented x 3 today.   Head-  normocephalic, atraumatic Eyes-  Sclera clear, conjunctiva pink Ears- hearing intact Oropharynx- clear Neck- supple, no JVP Lymph- no cervical lymphadenopathy Lungs- Clear to ausculation bilaterally, normal work of breathing Heart- Irregular rate and rhythm, no murmurs, rubs or gallops, PMI not laterally displaced GI- soft, NT, ND, + BS Extremities- no clubbing, cyanosis, or edema MS- no significant deformity or atrophy Skin- no rash or lesion Psych- euthymic mood, full affect Neuro- strength and sensation are intact  EKG-Shows Atrial flutter 88 bpm, QRS 138 ms, QTc 493 ms with variable block, RBBB, LAFB, LVH, unchanged from previous EKG. Epic records reviewed   Assessment and Plan:  1. Symptomatic A. Flutter, rate controlled Will be set up for TEE cardioversion tomorrow  No warfarin missed  but INR 1.9, 7/5, 1.8 today After discussion with Dr. Rayann Heman and pt concern re possible stroke with sub therapeutic INR, will give lovenox 120 mg(1 mg per kilogram) this pm and again in am. He will also increase coumadin this pm from regular dose of 5 mg to 7.5 mg tonight. Lipid panel/ thyroid panel drawn at patient request  F/u with Dr. Lovena Le after cardioversion.

## 2014-09-10 NOTE — Patient Instructions (Signed)
Your physician has recommended that you have a TEE guided Cardioversion (DCCV). Electrical Cardioversion uses a jolt of electricity to your heart either through paddles or wired patches attached to your chest. This is a controlled, usually prescheduled, procedure. Defibrillation is done under light anesthesia in the hospital, and you usually go home the day of the procedure. This is done to get your heart back into a normal rhythm. You are not awake for the procedure. Please see the instruction sheet given to you today.   Do Not eat or drink after midnight tonight and check in at the hospital at 9:00am at [facility] for a 10:00am procedure

## 2014-09-11 ENCOUNTER — Encounter (HOSPITAL_COMMUNITY)
Admission: RE | Disposition: A | Discharge status: Home / Self Care | Payer: Medicare Other | Source: Ambulatory Visit | Attending: Cardiology

## 2014-09-11 ENCOUNTER — Encounter (HOSPITAL_COMMUNITY): Payer: Self-pay | Admitting: Certified Registered"

## 2014-09-11 ENCOUNTER — Ambulatory Visit (HOSPITAL_COMMUNITY)
Admission: RE | Admit: 2014-09-11 | Discharge: 2014-09-11 | Disposition: A | Discharge status: Home / Self Care | Payer: Medicare Other | Source: Ambulatory Visit | Attending: Cardiology | Admitting: Cardiology

## 2014-09-11 ENCOUNTER — Ambulatory Visit (HOSPITAL_COMMUNITY): Payer: Medicare Other | Admitting: Certified Registered"

## 2014-09-11 DIAGNOSIS — Z950 Presence of cardiac pacemaker: Secondary | ICD-10-CM | POA: Diagnosis not present

## 2014-09-11 DIAGNOSIS — Z91041 Radiographic dye allergy status: Secondary | ICD-10-CM | POA: Insufficient documentation

## 2014-09-11 DIAGNOSIS — Z7901 Long term (current) use of anticoagulants: Secondary | ICD-10-CM | POA: Diagnosis not present

## 2014-09-11 DIAGNOSIS — Z952 Presence of prosthetic heart valve: Secondary | ICD-10-CM | POA: Insufficient documentation

## 2014-09-11 DIAGNOSIS — I4892 Unspecified atrial flutter: Secondary | ICD-10-CM

## 2014-09-11 DIAGNOSIS — E039 Hypothyroidism, unspecified: Secondary | ICD-10-CM | POA: Insufficient documentation

## 2014-09-11 DIAGNOSIS — N400 Enlarged prostate without lower urinary tract symptoms: Secondary | ICD-10-CM | POA: Insufficient documentation

## 2014-09-11 DIAGNOSIS — I4891 Unspecified atrial fibrillation: Secondary | ICD-10-CM | POA: Diagnosis present

## 2014-09-11 DIAGNOSIS — I2510 Atherosclerotic heart disease of native coronary artery without angina pectoris: Secondary | ICD-10-CM | POA: Insufficient documentation

## 2014-09-11 DIAGNOSIS — E785 Hyperlipidemia, unspecified: Secondary | ICD-10-CM | POA: Diagnosis not present

## 2014-09-11 HISTORY — PX: CARDIOVERSION: SHX1299

## 2014-09-11 HISTORY — PX: TEE WITHOUT CARDIOVERSION: SHX5443

## 2014-09-11 LAB — PROTIME-INR
INR: 2.39 — AB (ref 0.00–1.49)
Prothrombin Time: 25.8 seconds — ABNORMAL HIGH (ref 11.6–15.2)

## 2014-09-11 SURGERY — ECHOCARDIOGRAM, TRANSESOPHAGEAL
Anesthesia: Monitor Anesthesia Care

## 2014-09-11 MED ORDER — LACTATED RINGERS IV SOLN
INTRAVENOUS | Status: DC
  Administered 2014-09-11: 10:00:00 via INTRAVENOUS

## 2014-09-11 MED ORDER — MEPERIDINE HCL 100 MG/ML IJ SOLN: 6.2500 mg | INTRAMUSCULAR | Status: DC | PRN

## 2014-09-11 MED ORDER — LIDOCAINE VISCOUS 2 % MT SOLN
OROMUCOSAL | Status: AC
  Filled 2014-09-11: qty 15

## 2014-09-11 MED ORDER — BUTAMBEN-TETRACAINE-BENZOCAINE 2-2-14 % EX AERO
INHALATION_SPRAY | CUTANEOUS | Status: DC | PRN
  Administered 2014-09-11: 2 via TOPICAL

## 2014-09-11 MED ORDER — LIDOCAINE HCL (CARDIAC) 20 MG/ML IV SOLN
INTRAVENOUS | Status: DC | PRN
  Administered 2014-09-11: 80 mg via INTRAVENOUS

## 2014-09-11 MED ORDER — ONDANSETRON HCL 4 MG/2ML IJ SOLN: 4.0000 mg | Freq: Once | INTRAMUSCULAR | Status: DC | PRN

## 2014-09-11 MED ORDER — HYDROMORPHONE HCL 1 MG/ML IJ SOLN: 0.2500 mg | INTRAMUSCULAR | Status: DC | PRN

## 2014-09-11 MED ORDER — MIDAZOLAM HCL 5 MG/5ML IJ SOLN
INTRAMUSCULAR | Status: DC | PRN
  Administered 2014-09-11 (×2): 1 mg via INTRAVENOUS

## 2014-09-11 MED ORDER — PROPOFOL INFUSION 10 MG/ML OPTIME
INTRAVENOUS | Status: DC | PRN
  Administered 2014-09-11: 160 ug/kg/min via INTRAVENOUS

## 2014-09-11 MED ORDER — LIDOCAINE VISCOUS 2 % MT SOLN
OROMUCOSAL | Status: DC | PRN
  Administered 2014-09-11: 1 via OROMUCOSAL

## 2014-09-11 NOTE — Anesthesia Preprocedure Evaluation (Signed)
Anesthesia Evaluation  Patient identified by MRN, date of birth, ID band Patient awake    Reviewed: Allergy & Precautions, NPO status   Airway Mallampati: I  TM Distance: >3 FB Neck ROM: Full    Dental   Pulmonary    Pulmonary exam normal       Cardiovascular + CAD Normal cardiovascular exam+ pacemaker + Valvular Problems/Murmurs AS     Neuro/Psych    GI/Hepatic   Endo/Other    Renal/GU      Musculoskeletal   Abdominal   Peds  Hematology   Anesthesia Other Findings   Reproductive/Obstetrics                             Anesthesia Physical Anesthesia Plan  ASA: III  Anesthesia Plan: MAC   Post-op Pain Management:    Induction: Intravenous  Airway Management Planned: Natural Airway  Additional Equipment:   Intra-op Plan:   Post-operative Plan:   Informed Consent: I have reviewed the patients History and Physical, chart, labs and discussed the procedure including the risks, benefits and alternatives for the proposed anesthesia with the patient or authorized representative who has indicated his/her understanding and acceptance.     Plan Discussed with: CRNA and Surgeon  Anesthesia Plan Comments:         Anesthesia Quick Evaluation

## 2014-09-11 NOTE — Progress Notes (Signed)
  Echocardiogram Echocardiogram Transesophageal has been performed.  Diamond Nickel 09/11/2014, 10:48 AM

## 2014-09-11 NOTE — H&P (View-Only) (Signed)
Patient ID: [name], male   DOB: 29-Nov-1934, 80 y.o.   MRN: [id number]     Primary Care Physician: Limmie Patricia, MD Referring Physician:  Dr. Burt Knack Electrophysiologist: Dr. Gladstone Lighter is a 80 y.o. male with a h/o coronary artery disease, cardiac syndrome X., aortic stenosis status post bioprosthetic aortic valve replacement, and atrial fibrillation. He underwent aortic valve replacement with a 25 mm Edwards pericardial bioprosthesis and left-sided radiofrequency cryo- Cox maze procedure with ligation of the left atrial appendage in January 2013. He's had recurrence of atrial fibrillation and required amiodarone. He stopped amiodarone several months ago due to decreased PFT results. He drank a cup of decaf coffee from [employer] and went into an irregular rhythm almost immediately and EKG now shows aflutter with variable rate. He is in clinic requesting cardioversion. INR 7/5 1.9 so he will require a TEE. He is using extra metoprolol for rate control.  Today, he denies symptoms of palpitations, chest pain, shortness of breath, orthopnea, PND, lower extremity edema, dizziness, presyncope, syncope, or neurologic sequela. The patient is tolerating medications without difficulties and is otherwise without complaint today.   Past Medical History  Diagnosis Date  . Chronotropic incompetence with sinus node dysfunction     Status post Guidant dual-mode, dual-pacing, dual-sensing  pacemaker   implantation now programmed to AAI with recent generator change.  . Coronary artery disease     status post multiple prior percutaneous coronary interventions, microvascular angina per Dr Olevia Perches  . Aortic stenosis     moderate aortic stenosis  . Hyperlipidemia   . Hypercoagulable state     chronically anticoagulated with coumadin  . Heart murmur   . Stroke     1990  . Hyperthyroidism   . Benign prostatic hypertrophy   . Arthritis   . Paroxysmal atrial fibrillation     DR.  STuckey,   . Hypothyroidism     Dr. Elyse Hsu  . MGUS (monoclonal gammopathy of unknown significance) 02/17/2013   Past Surgical History  Procedure Laterality Date  . Pacemaker insertion  1991    Guidant PPM, most recent Generator Change by Dr Olevia Perches was 08/22/06  . Hemrrhoidectomy    . Cardiac catheterization      11  . Cardioversion    . Appendectomy    . Aortic valve replacement  03/15/2011    Procedure: AORTIC VALVE REPLACEMENT (AVR);  Surgeon: Gaye Pollack, MD;  Location: [place];  Service: Open Heart Surgery;  Laterality: N/A;  . Maze  03/15/2011    Procedure: MAZE;  Surgeon: Gaye Pollack, MD;  Location: [place];  Service: Open Heart Surgery;  Laterality: N/A;  . Tee without cardioversion  04/15/2011    Procedure: TRANSESOPHAGEAL ECHOCARDIOGRAM (TEE);  Surgeon: Loralie Champagne, MD;  Location: [place];  Service: Cardiovascular;  Laterality: N/A;  . Cardioversion  04/15/2011    Procedure: CARDIOVERSION;  Surgeon: Loralie Champagne, MD;  Location: [facility] ENDOSCOPY;  Service: Cardiovascular;  Laterality: N/A;  . Left and right heart catheterization with coronary angiogram Bilateral 02/01/2011    Procedure: LEFT AND RIGHT HEART CATHETERIZATION WITH CORONARY ANGIOGRAM;  Surgeon: Hillary Bow, MD;  Location: [facility] CATH LAB;  Service: Cardiovascular;  Laterality: Bilateral;    Current Outpatient Prescriptions  Medication Sig Dispense Refill  . bisacodyl (DULCOLAX) 5 MG EC tablet Take 5 mg by mouth daily as needed (constipation).     . butalbital-acetaminophen-caffeine (FIORICET, ESGIC) 50-325-40 MG per tablet Take 1 tablet by mouth 2 (two) times  daily.     . Coenzyme Q10 (COQ10) 100 MG CAPS Take 100 mg by mouth daily.    . Diclofenac Sodium 1.5 % SOLN Take 1 application by mouth daily as needed (pain).     Marland Kitchen doxazosin (CARDURA) 4 MG tablet Take 4 mg by mouth daily.    . fish oil-omega-3 fatty acids 1000 MG capsule Take 1 g by mouth as directed.     Marland Kitchen HYDROcodone-acetaminophen (NORCO) 10-325 MG  per tablet Take 0.5-1 tablet by mouth twice a day    . levothyroxine (SYNTHROID, LEVOTHROID) 50 MCG tablet Take 25 mcg by mouth daily.     Marland Kitchen LORazepam (ATIVAN) 1 MG tablet Take one tablet by mouth at bedtime. For anxiety    . metoprolol succinate (TOPROL-XL) 25 MG 24 hr tablet TAKE 1 TABLET BY MOUTH DAILY 180 tablet 0  . nitroGLYCERIN (NITROSTAT) 0.4 MG SL tablet Place 1 tablet (0.4 mg total) under the tongue every 5 (five) minutes as needed for chest pain. 25 tablet 3  . potassium chloride SA (K-DUR,KLOR-CON) 20 MEQ tablet TAKE 1 TABLET BY MOUTH ONCE DAILY 90 tablet 1  . warfarin (COUMADIN) 4 MG tablet TAKE 1 TO 2 TABLETS BY MOUTH DAILY 150 tablet 1  . rosuvastatin (CRESTOR) 20 MG tablet Take 1 tablet (20 mg total) by mouth at bedtime. (Patient not taking: Reported on 09/10/2014) 90 tablet 3   No current facility-administered medications for this encounter.    Allergies  Allergen Reactions  . Contrast Media [Iodinated Diagnostic Agents] Hives    History   Social History  . Marital Status: Married    Spouse Name: N/A  . Number of Children: N/A  . Years of Education: N/A   Occupational History  . Not on file.   Social History Main Topics  . Smoking status: Never Smoker   . Smokeless tobacco: Not on file  . Alcohol Use: No     Comment: occassional  . Drug Use: No  . Sexual Activity: Not on file   Other Topics Concern  . Not on file   Social History Narrative    Family History  Problem Relation Age of Onset  . Heart disease Brother     Twin brother has coronary disease and recent AVR for AS  . Anesthesia problems Neg Hx   . Hypotension Neg Hx   . Malignant hyperthermia Neg Hx   . Pseudochol deficiency Neg Hx     ROS- All systems are reviewed and negative except as per the HPI above  Physical Exam: Filed Vitals:   09/10/14 1449  BP: 122/74  Pulse: 88  Weight: 266 lb (120.657 kg)    GEN- The patient is well appearing, alert and oriented x 3 today.   Head-  normocephalic, atraumatic Eyes-  Sclera clear, conjunctiva pink Ears- hearing intact Oropharynx- clear Neck- supple, no JVP Lymph- no cervical lymphadenopathy Lungs- Clear to ausculation bilaterally, normal work of breathing Heart- Irregular rate and rhythm, no murmurs, rubs or gallops, PMI not laterally displaced GI- soft, NT, ND, + BS Extremities- no clubbing, cyanosis, or edema MS- no significant deformity or atrophy Skin- no rash or lesion Psych- euthymic mood, full affect Neuro- strength and sensation are intact  EKG-Shows Atrial flutter 88 bpm, QRS 138 ms, QTc 493 ms with variable block, RBBB, LAFB, LVH, unchanged from previous EKG. Epic records reviewed   Assessment and Plan:  1. Symptomatic A. Flutter, rate controlled Will be set up for TEE cardioversion tomorrow  No warfarin missed  but INR 1.9, 7/5, 1.8 today After discussion with Dr. Rayann Heman and pt concern re possible stroke with sub therapeutic INR, will give lovenox 120 mg(1 mg per kilogram) this pm and again in am. He will also increase coumadin this pm from regular dose of 5 mg to 7.5 mg tonight. Lipid panel/ thyroid panel drawn at patient request  F/u with Dr. Lovena Le after cardioversion.

## 2014-09-11 NOTE — Anesthesia Postprocedure Evaluation (Signed)
Anesthesia Post Note  Patient: [name]  Procedure(s) Performed: Procedure(s) (LRB): TRANSESOPHAGEAL ECHOCARDIOGRAM (TEE) (N/A) CARDIOVERSION (N/A)  Anesthesia type: general  Patient location: PACU  Post pain: Pain level controlled  Post assessment: Patient's Cardiovascular Status Stable  Last Vitals:  Filed Vitals:   09/11/14 1130  BP: 156/76  Pulse: 60  Temp:   Resp: 12    Post vital signs: Reviewed and stable  Level of consciousness: awake  Complications: No apparent anesthesia complications

## 2014-09-11 NOTE — Transfer of Care (Signed)
Immediate Anesthesia Transfer of Care Note  Patient: [name]  Procedure(s) Performed: Procedure(s): TRANSESOPHAGEAL ECHOCARDIOGRAM (TEE) (N/A) CARDIOVERSION (N/A)  Patient Location: Endoscopy Unit  Anesthesia Type:MAC  Level of Consciousness: awake, alert  and oriented  Airway & Oxygen Therapy: Patient Spontanous Breathing and Patient connected to nasal cannula oxygen  Post-op Assessment: Report given to RN, Post -op Vital signs reviewed and stable and Patient moving all extremities  Post vital signs: Reviewed and stable  Last Vitals:  Filed Vitals:   09/11/14 1110  BP: 118/85  Pulse: 59  Temp:   Resp: 15    Complications: No apparent anesthesia complications

## 2014-09-11 NOTE — Interval H&P Note (Signed)
History and Physical Interval Note:  09/11/2014 10:20 AM  [name]  has presented today for surgery, with the diagnosis of AFLUTTER  The various methods of treatment have been discussed with the patient and family. After consideration of risks, benefits and other options for treatment, the patient has consented to  Procedure(s): TRANSESOPHAGEAL ECHOCARDIOGRAM (TEE) (N/A) CARDIOVERSION (N/A) as a surgical intervention .  The patient's history has been reviewed, patient examined, no change in status, stable for surgery.  I have reviewed the patient's chart and labs.  Questions were answered to the patient's satisfaction.     TURNER,TRACI R

## 2014-09-11 NOTE — Discharge Instructions (Signed)

## 2014-09-11 NOTE — CV Procedure (Signed)
     PROCEDURE NOTE  Procedure:  Transesophageal echocardiogram /Direct Current Cardioversion Operator:  Fransico Him, MD Indications:  Atrial flutter Complications:None IV Meds:350mg  Propofol, 2mg  Versed IV  Results: Normal LV size and function EF 60% Normal RV size and function Normal RA with pacer wire noted that appears normal Moderately dilated LA with spontaneous echo contrast but no evidence of thrombus.  The LA appendage has been ligated with a very small residual sac present with no evidence of thrombus or spontaneous echo contrast. Normal TV with mild TR Normal PV Normal functioning bioprosthetic aortic valve with no perivalvular AI Normal MV with mild MR Normal interatrial septum with no evidence of shunt by colorflow doppler. Mild atherosclerosis of the thoracic aorta.  The patient tolerated the procedure well and proceeded with DCCV.      Electrical Cardioversion Procedure Note [name] [id number] 1934/08/16  Procedure: Electrical Cardioversion Indications:  Atrial Flutter  Time Out: Verified patient identification, verified procedure,medications/allergies/relevent history reviewed, required imaging and test results available.  Performed  Procedure Details  The patient was NPO after midnight. Anesthesia was administered at the beside  by [facility] with 350mg  of propofol.  Cardioversion was done with synchronized biphasic defibrillation with AP pads with 150watts.  The patient converted to normal sinus rhythm. The patient tolerated the procedure well   IMPRESSION:  Successful cardioversion of atrial flutter    Muhammed Teutsch R 09/11/2014, 10:49 AM

## 2014-09-12 ENCOUNTER — Encounter (HOSPITAL_COMMUNITY): Payer: Self-pay | Admitting: Cardiology

## 2014-09-13 ENCOUNTER — Ambulatory Visit (INDEPENDENT_AMBULATORY_CARE_PROVIDER_SITE_OTHER): Payer: Medicare Other | Admitting: Internal Medicine

## 2014-09-13 ENCOUNTER — Encounter: Payer: Self-pay | Admitting: Internal Medicine

## 2014-09-13 ENCOUNTER — Ambulatory Visit (INDEPENDENT_AMBULATORY_CARE_PROVIDER_SITE_OTHER): Payer: Medicare Other | Admitting: *Deleted

## 2014-09-13 ENCOUNTER — Other Ambulatory Visit: Payer: Self-pay

## 2014-09-13 VITALS — BP 124/70 | HR 60 | Ht 74.0 in

## 2014-09-13 DIAGNOSIS — I4891 Unspecified atrial fibrillation: Secondary | ICD-10-CM | POA: Diagnosis not present

## 2014-09-13 DIAGNOSIS — I2510 Atherosclerotic heart disease of native coronary artery without angina pectoris: Secondary | ICD-10-CM | POA: Diagnosis not present

## 2014-09-13 DIAGNOSIS — Z7901 Long term (current) use of anticoagulants: Secondary | ICD-10-CM | POA: Diagnosis not present

## 2014-09-13 DIAGNOSIS — Z950 Presence of cardiac pacemaker: Secondary | ICD-10-CM

## 2014-09-13 DIAGNOSIS — I480 Paroxysmal atrial fibrillation: Secondary | ICD-10-CM | POA: Diagnosis not present

## 2014-09-13 LAB — CUP PACEART INCLINIC DEVICE CHECK
Brady Statistic RA Percent Paced: 88 %
Lead Channel Impedance Value: 500 Ohm
Lead Channel Pacing Threshold Pulse Width: 1 ms
Lead Channel Setting Pacing Amplitude: 3.5 V
MDC IDC MSMT LEADCHNL RA PACING THRESHOLD AMPLITUDE: 1.6 V
MDC IDC MSMT LEADCHNL RA SENSING INTR AMPL: 1.1 mV
MDC IDC SESS DTM: 20160715040000
MDC IDC STAT BRADY RV PERCENT PACED: 0 %
Pulse Gen Serial Number: 567685

## 2014-09-13 LAB — POCT INR: INR: 2.9

## 2014-09-13 NOTE — Assessment & Plan Note (Signed)
He denies anginal symptoms except during strenuous activity. No change in meds today.

## 2014-09-13 NOTE — Assessment & Plan Note (Signed)
We discussed treatment options in detail. He has had ERAF with atypical atrial flutter (LA) and now returned to NSR. He had lung and thyroid problems with amiodarone and has been off of this for nearly a year. We discussed Tiksoyn, Multaq, and catheter ablation. He is reflecting. In my mind no clear advantage to any of the above of options. He is avoiding caffeine and ETOH.

## 2014-09-13 NOTE — Assessment & Plan Note (Signed)
Today we discussed ongoing Warfarin vs a NOAC. He will continue Warfarin for now.

## 2014-09-13 NOTE — Patient Instructions (Signed)
Medication Instructions:  Your physician recommends that you continue on your current medications as directed. Please refer to the Current Medication list given to you today.   Labwork: None ordered  Testing/Procedures: None ordered  Follow-Up: Your physician recommends that you schedule a follow-up appointment as scheduled   Any Other Special Instructions Will Be Listed Below (If Applicable).

## 2014-09-13 NOTE — Assessment & Plan Note (Signed)
His [place] Sci device is stable. He does have some electrical noise on his atrial lead which is short lived. I would anticipate a new lead at the time of PM generator change.

## 2014-09-13 NOTE — Progress Notes (Signed)
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 The patient has an extensive cardiac history with long-standing hypertension and sinus node dysfunction, status post pacemaker insertion, CAD, s/p multiple interventions, PAF, and is s/p AVR for aortic stenosis with surgical MAZE. The patient had maintained sinus rhythm on amiodarone therapy. He developed thyroid dysfunction and possible pulmonary toxicity and his amio has been stopped for nearly a year. He has not had syncope. He denies chest pain. He notes that his palpitations have worsened and he is experiencing more atrial fib which makes him more sob. He underwent DCCV several days ago, had ERAF, and then converted back to NSR on his own 2 days ago. He feels better.  Allergies  Allergen Reactions  . Contrast Media [Iodinated Diagnostic Agents] Hives     Current Outpatient Prescriptions  Medication Sig Dispense Refill  . bisacodyl (DULCOLAX) 5 MG EC tablet Take 5 mg by mouth daily as needed (constipation).     . butalbital-acetaminophen-caffeine (FIORICET, ESGIC) 50-325-40 MG per tablet Take 1 tablet by mouth 2 (two) times daily.     . Coenzyme Q10 (COQ10) 100 MG CAPS Take 100 mg by mouth daily.    . Diclofenac Sodium 1.5 % SOLN Take 1 application by mouth daily as needed (pain).     Marland Kitchen doxazosin (CARDURA) 4 MG tablet Take 4 mg by mouth daily.    . fish oil-omega-3 fatty acids 1000 MG capsule Take 1 g by mouth as directed.     Marland Kitchen HYDROcodone-acetaminophen (NORCO) 10-325 MG per tablet Take 0.5-1 tablet by mouth twice a day    . levothyroxine (SYNTHROID, LEVOTHROID) 50 MCG tablet Take 25 mcg by mouth daily.     Marland Kitchen LORazepam (ATIVAN) 1 MG tablet Take one tablet by mouth at bedtime. For anxiety    . metoprolol succinate (TOPROL-XL) 25 MG 24 hr tablet TAKE 1 TABLET BY MOUTH DAILY 180 tablet 0  . nitroGLYCERIN (NITROSTAT) 0.4 MG SL tablet Place 1 tablet (0.4 mg total) under the tongue every 5 (five) minutes as needed for chest pain. 25 tablet 3  .  potassium chloride SA (K-DUR,KLOR-CON) 20 MEQ tablet TAKE 1 TABLET BY MOUTH ONCE DAILY 90 tablet 1  . rosuvastatin (CRESTOR) 20 MG tablet Take 1 tablet (20 mg total) by mouth at bedtime. 90 tablet 3  . warfarin (COUMADIN) 4 MG tablet TAKE 1 TO 2 TABLETS BY MOUTH DAILY 150 tablet 1   No current facility-administered medications for this visit.     Past Medical History  Diagnosis Date  . Chronotropic incompetence with sinus node dysfunction     Status post Guidant dual-mode, dual-pacing, dual-sensing  pacemaker   implantation now programmed to AAI with recent generator change.  . Coronary artery disease     status post multiple prior percutaneous coronary interventions, microvascular angina per Dr Olevia Perches  . Aortic stenosis     moderate aortic stenosis  . Hyperlipidemia   . Hypercoagulable state     chronically anticoagulated with coumadin  . Heart murmur   . Stroke     1990  . Hyperthyroidism   . Benign prostatic hypertrophy   . Arthritis   . Paroxysmal atrial fibrillation     DR. STuckey,   . Hypothyroidism     Dr. Elyse Hsu  . MGUS (monoclonal gammopathy of unknown significance) 02/17/2013    ROS:   All systems reviewed and negative except as noted in the HPI.   Past Surgical History  Procedure Laterality Date  . Pacemaker  insertion  1991    Guidant PPM, most recent Generator Change by Dr Olevia Perches was 08/22/06  . Hemrrhoidectomy    . Cardiac catheterization      11  . Cardioversion    . Appendectomy    . Aortic valve replacement  03/15/2011    Procedure: AORTIC VALVE REPLACEMENT (AVR);  Surgeon: Gaye Pollack, MD;  Location: [place];  Service: Open Heart Surgery;  Laterality: N/A;  . Maze  03/15/2011    Procedure: MAZE;  Surgeon: Gaye Pollack, MD;  Location: [place];  Service: Open Heart Surgery;  Laterality: N/A;  . Tee without cardioversion  04/15/2011    Procedure: TRANSESOPHAGEAL ECHOCARDIOGRAM (TEE);  Surgeon: Loralie Champagne, MD;  Location: [place];  Service:  Cardiovascular;  Laterality: N/A;  . Cardioversion  04/15/2011    Procedure: CARDIOVERSION;  Surgeon: Loralie Champagne, MD;  Location: [facility] ENDOSCOPY;  Service: Cardiovascular;  Laterality: N/A;  . Left and right heart catheterization with coronary angiogram Bilateral 02/01/2011    Procedure: LEFT AND RIGHT HEART CATHETERIZATION WITH CORONARY ANGIOGRAM;  Surgeon: Hillary Bow, MD;  Location: [facility] CATH LAB;  Service: Cardiovascular;  Laterality: Bilateral;  . Tee without cardioversion N/A 09/11/2014    Procedure: TRANSESOPHAGEAL ECHOCARDIOGRAM (TEE);  Surgeon: Sueanne Margarita, MD;  Location: [facility] ENDOSCOPY;  Service: Cardiovascular;  Laterality: N/A;  . Cardioversion N/A 09/11/2014    Procedure: CARDIOVERSION;  Surgeon: Sueanne Margarita, MD;  Location: [facility] ENDOSCOPY;  Service: Cardiovascular;  Laterality: N/A;     Family History  Problem Relation Age of Onset  . Heart disease Brother     Twin brother has coronary disease and recent AVR for AS  . Anesthesia problems Neg Hx   . Hypotension Neg Hx   . Malignant hyperthermia Neg Hx   . Pseudochol deficiency Neg Hx      History   Social History  . Marital Status: Married    Spouse Name: N/A  . Number of Children: N/A  . Years of Education: N/A   Occupational History  . Not on file.   Social History Main Topics  . Smoking status: Never Smoker   . Smokeless tobacco: Not on file  . Alcohol Use: No     Comment: occassional  . Drug Use: No  . Sexual Activity: Not on file   Other Topics Concern  . Not on file   Social History Narrative     BP 124/70 mmHg  Pulse 60  Ht 6\' 2"  (1.88 m)  Physical Exam:  Well appearing 80 year old man, NAD HEENT: Unremarkable Neck:  6 cm JVD, no thyromegally Back:  No CVA tenderness Lungs:  Clear with no wheezes, rales, or rhonchi. HEART:  Regular brady rhythm, split S2. Abd:  soft, positive bowel sounds, no organomegally, no rebound, no guarding Ext:  2 plus pulses, no edema, no cyanosis, no  clubbing Skin:  No rashes no nodules Neuro:  CN II through XII intact, motor grossly intact  ECG - NSR with atrial pacing and RBBB.  DEVICE  Normal device function.  See PaceArt for details.   Assess/Plan: a

## 2014-09-17 DIAGNOSIS — E782 Mixed hyperlipidemia: Secondary | ICD-10-CM | POA: Diagnosis not present

## 2014-09-17 DIAGNOSIS — Z954 Presence of other heart-valve replacement: Secondary | ICD-10-CM | POA: Diagnosis not present

## 2014-09-17 DIAGNOSIS — I481 Persistent atrial fibrillation: Secondary | ICD-10-CM | POA: Diagnosis not present

## 2014-09-17 DIAGNOSIS — G7000 Myasthenia gravis without (acute) exacerbation: Secondary | ICD-10-CM | POA: Diagnosis not present

## 2014-09-17 DIAGNOSIS — D472 Monoclonal gammopathy: Secondary | ICD-10-CM | POA: Diagnosis not present

## 2014-09-17 DIAGNOSIS — I2510 Atherosclerotic heart disease of native coronary artery without angina pectoris: Secondary | ICD-10-CM | POA: Diagnosis not present

## 2014-09-17 DIAGNOSIS — I951 Orthostatic hypotension: Secondary | ICD-10-CM | POA: Diagnosis not present

## 2014-09-17 DIAGNOSIS — Z950 Presence of cardiac pacemaker: Secondary | ICD-10-CM | POA: Diagnosis not present

## 2014-09-17 DIAGNOSIS — Z862 Personal history of diseases of the blood and blood-forming organs and certain disorders involving the immune mechanism: Secondary | ICD-10-CM | POA: Diagnosis not present

## 2014-09-18 DIAGNOSIS — I481 Persistent atrial fibrillation: Secondary | ICD-10-CM | POA: Diagnosis not present

## 2014-09-18 DIAGNOSIS — Z954 Presence of other heart-valve replacement: Secondary | ICD-10-CM | POA: Diagnosis not present

## 2014-09-18 DIAGNOSIS — Z950 Presence of cardiac pacemaker: Secondary | ICD-10-CM | POA: Diagnosis not present

## 2014-09-19 ENCOUNTER — Encounter: Payer: Self-pay | Admitting: Internal Medicine

## 2014-10-16 DIAGNOSIS — I481 Persistent atrial fibrillation: Secondary | ICD-10-CM | POA: Diagnosis not present

## 2014-10-16 DIAGNOSIS — I2510 Atherosclerotic heart disease of native coronary artery without angina pectoris: Secondary | ICD-10-CM | POA: Diagnosis not present

## 2014-10-16 DIAGNOSIS — Z950 Presence of cardiac pacemaker: Secondary | ICD-10-CM | POA: Diagnosis not present

## 2014-10-16 DIAGNOSIS — I951 Orthostatic hypotension: Secondary | ICD-10-CM | POA: Diagnosis not present

## 2014-10-16 DIAGNOSIS — Z954 Presence of other heart-valve replacement: Secondary | ICD-10-CM | POA: Diagnosis not present

## 2014-10-24 DIAGNOSIS — I4891 Unspecified atrial fibrillation: Secondary | ICD-10-CM | POA: Diagnosis not present

## 2014-10-24 DIAGNOSIS — Z950 Presence of cardiac pacemaker: Secondary | ICD-10-CM | POA: Diagnosis not present

## 2014-10-24 DIAGNOSIS — I951 Orthostatic hypotension: Secondary | ICD-10-CM | POA: Diagnosis not present

## 2014-10-24 DIAGNOSIS — I484 Atypical atrial flutter: Secondary | ICD-10-CM | POA: Diagnosis not present

## 2014-10-25 ENCOUNTER — Telehealth: Payer: Self-pay | Admitting: Internal Medicine

## 2014-10-25 NOTE — Telephone Encounter (Signed)
New message      Pt is having problems regarding his metoprolol

## 2014-10-25 NOTE — Telephone Encounter (Signed)
Left message on both of pts contact numbers to contact the office back.   Operator tried to connect the pt to triage and was accidentally disconnected in transition.  Will continue to follow-up with the pt.

## 2014-10-28 MED ORDER — METOPROLOL SUCCINATE ER 25 MG PO TB24: 25.0000 mg | ORAL_TABLET | Freq: Every day | ORAL | Status: DC

## 2014-10-28 NOTE — Telephone Encounter (Signed)
Pt requesting refill for metoprolol succinate.

## 2014-10-30 ENCOUNTER — Encounter: Payer: Medicare Other | Admitting: Internal Medicine

## 2014-11-05 ENCOUNTER — Encounter: Payer: Medicare Other | Admitting: Internal Medicine

## 2014-11-26 ENCOUNTER — Other Ambulatory Visit: Payer: Self-pay | Admitting: Cardiovascular Disease

## 2014-11-27 ENCOUNTER — Ambulatory Visit (INDEPENDENT_AMBULATORY_CARE_PROVIDER_SITE_OTHER): Payer: Medicare Other | Admitting: *Deleted

## 2014-11-27 DIAGNOSIS — R002 Palpitations: Secondary | ICD-10-CM | POA: Diagnosis not present

## 2014-11-27 LAB — CUP PACEART INCLINIC DEVICE CHECK
Brady Statistic RA Percent Paced: 96 %
Brady Statistic RV Percent Paced: 87 %
Date Time Interrogation Session: 20160928150933
Lead Channel Impedance Value: 530 Ohm
Lead Channel Pacing Threshold Amplitude: 1.7 V
Lead Channel Pacing Threshold Pulse Width: 1 ms
Lead Channel Setting Pacing Amplitude: 3.5 V
MDC IDC PG SERIAL: 567685

## 2014-11-27 NOTE — Progress Notes (Signed)
Pacemaker check in clinic for palpitations and occaisional dizziness. Normal device function. Threshold, impedance consistent with previous measurements. Device programmed back to AAIR 60-100bpm (reprogrammed AOOR 60-140 at [facility], pt hasn't felt well since).  ATRs show PACs. No high ventricular rates recorded. Device programmed at appropriate safety margins. Atrial histogram distribution appropriate for patient activity level. Ventricular histogram shows rates in 90-110 bin and 110-130bpm bin. Device programmed to optimize intrinsic conduction. Estimated longevity 1 year. Dr. Lovena Le ordered 48hr holter monitor per pt request- to be placed 11/28/14.

## 2014-11-28 ENCOUNTER — Ambulatory Visit (INDEPENDENT_AMBULATORY_CARE_PROVIDER_SITE_OTHER): Payer: Medicare Other

## 2014-11-28 DIAGNOSIS — R002 Palpitations: Secondary | ICD-10-CM | POA: Diagnosis not present

## 2014-11-28 DIAGNOSIS — R42 Dizziness and giddiness: Secondary | ICD-10-CM | POA: Diagnosis not present

## 2014-12-17 ENCOUNTER — Encounter: Payer: Self-pay | Admitting: Internal Medicine

## 2014-12-17 DIAGNOSIS — I10 Essential (primary) hypertension: Secondary | ICD-10-CM | POA: Diagnosis not present

## 2014-12-24 DIAGNOSIS — E038 Other specified hypothyroidism: Secondary | ICD-10-CM | POA: Diagnosis not present

## 2014-12-24 DIAGNOSIS — Z125 Encounter for screening for malignant neoplasm of prostate: Secondary | ICD-10-CM | POA: Diagnosis not present

## 2014-12-24 DIAGNOSIS — R5383 Other fatigue: Secondary | ICD-10-CM | POA: Diagnosis not present

## 2014-12-26 ENCOUNTER — Ambulatory Visit (HOSPITAL_COMMUNITY): Admission: EM | Admit: 2014-12-26 | Payer: Medicare Other | Source: Ambulatory Visit | Admitting: Cardiovascular Disease

## 2014-12-26 ENCOUNTER — Encounter: Payer: Self-pay | Admitting: Internal Medicine

## 2014-12-26 ENCOUNTER — Encounter (HOSPITAL_COMMUNITY): Admission: EM | Payer: Self-pay | Source: Ambulatory Visit

## 2014-12-26 ENCOUNTER — Ambulatory Visit (INDEPENDENT_AMBULATORY_CARE_PROVIDER_SITE_OTHER): Payer: Medicare Other | Admitting: Internal Medicine

## 2014-12-26 ENCOUNTER — Other Ambulatory Visit: Payer: Self-pay | Admitting: *Deleted

## 2014-12-26 VITALS — BP 120/64 | HR 64 | Ht 73.0 in | Wt 225.0 lb

## 2014-12-26 DIAGNOSIS — Z950 Presence of cardiac pacemaker: Secondary | ICD-10-CM

## 2014-12-26 DIAGNOSIS — I4892 Unspecified atrial flutter: Secondary | ICD-10-CM | POA: Diagnosis not present

## 2014-12-26 DIAGNOSIS — I480 Paroxysmal atrial fibrillation: Secondary | ICD-10-CM

## 2014-12-26 DIAGNOSIS — I495 Sick sinus syndrome: Secondary | ICD-10-CM

## 2014-12-26 DIAGNOSIS — R079 Chest pain, unspecified: Secondary | ICD-10-CM

## 2014-12-26 DIAGNOSIS — I2510 Atherosclerotic heart disease of native coronary artery without angina pectoris: Secondary | ICD-10-CM

## 2014-12-26 DIAGNOSIS — I498 Other specified cardiac arrhythmias: Secondary | ICD-10-CM

## 2014-12-26 LAB — CUP PACEART INCLINIC DEVICE CHECK
Brady Statistic AP VP Percent: 84 %
Brady Statistic AP VS Percent: 12 %
HighPow Impedance: 55 Ohm
HighPow Impedance: 80 Ohm
Implantable Lead Implant Date: 19910315
Implantable Lead Location: 753859
Lead Channel Impedance Value: 652 Ohm
Lead Channel Sensing Intrinsic Amplitude: 10.8 mV
MDC IDC LEAD IMPLANT DT: 19910315
MDC IDC LEAD LOCATION: 753860
MDC IDC MSMT LEADCHNL RA SENSING INTR AMPL: 4.2 mV
MDC IDC MSMT LEADCHNL RV IMPEDANCE VALUE: 544 Ohm
MDC IDC SESS DTM: 20161027040000
MDC IDC SET LEADCHNL RA PACING AMPLITUDE: 3.5 V
MDC IDC STAT BRADY AS VP PERCENT: 3 %
MDC IDC STAT BRADY AS VS PERCENT: 1 %
Pulse Gen Serial Number: 113171

## 2014-12-26 SURGERY — CARDIOVERSION
Anesthesia: Monitor Anesthesia Care

## 2014-12-26 NOTE — Assessment & Plan Note (Signed)
His [place] PPM is working normally. Will recheck in several months.  

## 2014-12-26 NOTE — Patient Instructions (Addendum)
Medication Instructions:  Your physician recommends that you continue on your current medications as directed. Please refer to the Current Medication list given to you today.  Labwork: Pre procedure labs at hospital today  Testing/Procedures: Your physician has recommended that you have a Cardioversion (DCCV). Electrical Cardioversion uses a jolt of electricity to your heart either through paddles or wired patches attached to your chest. This is a controlled, usually prescheduled, procedure. Defibrillation is done under light anesthesia in the hospital, and you usually go home the day of the procedure. This is done to get your heart back into a normal rhythm. You are not awake for the procedure. Please see the instruction sheet given to you today.  Follow-Up: You have been referred to Dr. Rayann Heman to discuss AFib ablation.  You have a follow-up appointment on: 02/13/15 at 10:30 a.m. with device clinic  Your physician wants you to follow-up in: 1 year with Dr. Lovena Le.  You will receive a reminder letter in the mail two months in advance. If you don't receive a letter, please call our office to schedule the follow-up appointment.   Any Other Special Instructions Will Be Listed Below (If Applicable).  If you need a refill on your cardiac medications before your next appointment, please call your pharmacy.  Thank you for choosing [facility]!!     Your provider has recommended a cardioversion.   You are scheduled for a cardioversion on 12/26/2014 at 1:30 p.m. with Dr. Oval Linsey or associates. Please go to [facility]nd [place] Stay at 11:30 a.m..  Enter through the [facility] not have any food or drink after midnight on 12/25/15.  You may take your medicines with a sip of water on the day of your procedure.  You will need someone to drive you home following your procedure.   Call the [place] office at [phone] if you have  any questions, problems or concerns.

## 2014-12-26 NOTE — Assessment & Plan Note (Signed)
He had some earlier due to his rapid rate. He is feeling better now.

## 2014-12-26 NOTE — Progress Notes (Signed)
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 The patient has an extensive cardiac history with long-standing hypertension and sinus node dysfunction, status post pacemaker insertion, CAD, s/p multiple interventions, PAF, and is s/p AVR for aortic stenosis with surgical MAZE. The patient had maintained sinus rhythm on amiodarone therapy. He developed thyroid dysfunction and possible pulmonary toxicity and his amio has been stopped for over a year. Early this morning, he went out of rhythm. He feels poorly.   Allergies  Allergen Reactions  . Contrast Media [Iodinated Diagnostic Agents] Hives     Current Outpatient Prescriptions  Medication Sig Dispense Refill  . apixaban (ELIQUIS) 5 MG TABS tablet Take 5 mg by mouth 2 (two) times daily.    . bisacodyl (DULCOLAX) 5 MG EC tablet Take 5 mg by mouth daily as needed (constipation).     . butalbital-acetaminophen-caffeine (FIORICET, ESGIC) 50-325-40 MG per tablet Take 1 tablet by mouth 2 (two) times daily.     . Coenzyme Q10 (COQ10) 100 MG CAPS Take 100 mg by mouth daily.    . Diclofenac Sodium 1.5 % SOLN Take 1 application by mouth daily as needed (pain).     Marland Kitchen doxazosin (CARDURA) 4 MG tablet Take 4 mg by mouth daily.    . fish oil-omega-3 fatty acids 1000 MG capsule Take 1 g by mouth as directed.     Marland Kitchen HYDROcodone-acetaminophen (NORCO) 10-325 MG per tablet Take 0.5-1 tablet by mouth twice a day    . levothyroxine (SYNTHROID, LEVOTHROID) 50 MCG tablet Take 25 mcg by mouth daily.     Marland Kitchen LORazepam (ATIVAN) 1 MG tablet Take one tablet by mouth at bedtime. For anxiety    . metoprolol succinate (TOPROL-XL) 25 MG 24 hr tablet Take 1 tablet (25 mg total) by mouth daily. 90 tablet 3  . nitroGLYCERIN (NITROSTAT) 0.4 MG SL tablet Place 1 tablet (0.4 mg total) under the tongue every 5 (five) minutes as needed for chest pain. 25 tablet 3  . potassium chloride SA (K-DUR,KLOR-CON) 20 MEQ tablet TAKE 1 TABLET BY MOUTH ONCE DAILY 90 tablet 1  .  rosuvastatin (CRESTOR) 20 MG tablet Take 1 tablet (20 mg total) by mouth at bedtime. 90 tablet 3  . warfarin (COUMADIN) 4 MG tablet TAKE 1 TO 2 TABLETS BY MOUTH DAILY 150 tablet 1   No current facility-administered medications for this visit.     Past Medical History  Diagnosis Date  . 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  . Coronary artery disease     status post multiple prior percutaneous coronary interventions, microvascular angina per Dr Olevia Perches  . Aortic stenosis     moderate aortic stenosis  . Hyperlipidemia   . Hypercoagulable state ([place])     chronically anticoagulated with coumadin  . Heart murmur   . Stroke ([place])     1990  . Hyperthyroidism   . Benign prostatic hypertrophy   . Arthritis   . Paroxysmal atrial fibrillation ([place])     DR. Lia Foyer,   . Hypothyroidism     Dr. Elyse Hsu  . MGUS (monoclonal gammopathy of unknown significance) 02/17/2013    ROS:   All systems reviewed and negative except as noted in the HPI.   Past Surgical History  Procedure Laterality Date  . Pacemaker insertion  1991    Guidant PPM, most recent Generator  Change by Dr Olevia Perches was 08/22/06  . Hemrrhoidectomy    . Cardiac catheterization      11  . Cardioversion    . Appendectomy    . Aortic valve replacement  03/15/2011    Procedure: AORTIC VALVE REPLACEMENT (AVR);  Surgeon: Gaye Pollack, MD;  Location: [place];  Service: Open Heart Surgery;  Laterality: N/A;  . Maze  03/15/2011    Procedure: MAZE;  Surgeon: Gaye Pollack, MD;  Location: [place];  Service: Open Heart Surgery;  Laterality: N/A;  . Tee without cardioversion  04/15/2011    Procedure: TRANSESOPHAGEAL ECHOCARDIOGRAM (TEE);  Surgeon: Loralie Champagne, MD;  Location: [place];  Service: Cardiovascular;  Laterality: N/A;  . Cardioversion  04/15/2011    Procedure:  CARDIOVERSION;  Surgeon: Loralie Champagne, MD;  Location: [facility] ENDOSCOPY;  Service: Cardiovascular;  Laterality: N/A;  . Left and right heart catheterization with coronary angiogram Bilateral 02/01/2011    Procedure: LEFT AND RIGHT HEART CATHETERIZATION WITH CORONARY ANGIOGRAM;  Surgeon: Hillary Bow, MD;  Location: [facility] CATH LAB;  Service: Cardiovascular;  Laterality: Bilateral;  . Tee without cardioversion N/A 09/11/2014    Procedure: TRANSESOPHAGEAL ECHOCARDIOGRAM (TEE);  Surgeon: Sueanne Margarita, MD;  Location: [facility] ENDOSCOPY;  Service: Cardiovascular;  Laterality: N/A;  . Cardioversion N/A 09/11/2014    Procedure: CARDIOVERSION;  Surgeon: Sueanne Margarita, MD;  Location: [facility] ENDOSCOPY;  Service: Cardiovascular;  Laterality: N/A;     Family History  Problem Relation Age of Onset  . Heart disease Brother     Twin brother has coronary disease and recent AVR for AS  . Anesthesia problems Neg Hx   . Hypotension Neg Hx   . Malignant hyperthermia Neg Hx   . Pseudochol deficiency Neg Hx      Social History   Social History  . Marital Status: Married    Spouse Name: N/A  . Number of Children: N/A  . Years of Education: N/A   Occupational History  . Not on file.   Social History Main Topics  . Smoking status: Never Smoker   . Smokeless tobacco: Not on file  . Alcohol Use: No     Comment: occassional  . Drug Use: No  . Sexual Activity: Not on file   Other Topics Concern  . Not on file   Social History Narrative     BP 120/64 mmHg  Pulse 64  Ht 6\' 1"  (1.854 m)  Wt 225 lb (102.059 kg)  BMI 29.69 kg/m2  Physical Exam:  Well appearing 80 year old man, NAD HEENT: Unremarkable Neck:  6 cm JVD, no thyromegally Back:  No CVA tenderness Lungs:  Clear with no wheezes, rales, or rhonchi. HEART:  Regular brady rhythm, split S2. Abd:  soft, positive bowel sounds, no organomegally, no rebound, no guarding Ext:  2 plus pulses, no edema, no cyanosis, no clubbing Skin:  No rashes no  nodules Neuro:  CN II through XII intact, motor grossly intact  ECG - NSR with atrial pacing and RBBB.  DEVICE  Normal device function.  See PaceArt for details. He is in atrial fib  Assess/Plan:

## 2014-12-26 NOTE — Assessment & Plan Note (Signed)
He denies anginal symptoms. Will follow. 

## 2014-12-26 NOTE — Assessment & Plan Note (Signed)
He has returned to atrial fib and has felt poorly. He denies missing any of his Eliquis. I have recommended he be cardioverted today. Will obtain labs and set him up for DCCV later today. He will need referral to Dr. Greggory Brandy to consider ablation.

## 2015-01-01 ENCOUNTER — Ambulatory Visit (INDEPENDENT_AMBULATORY_CARE_PROVIDER_SITE_OTHER): Payer: Medicare Other | Admitting: Internal Medicine

## 2015-01-01 ENCOUNTER — Encounter: Payer: Self-pay | Admitting: Internal Medicine

## 2015-01-01 VITALS — BP 128/78 | HR 63 | Ht 73.5 in | Wt 220.0 lb

## 2015-01-01 DIAGNOSIS — Z7901 Long term (current) use of anticoagulants: Secondary | ICD-10-CM

## 2015-01-01 DIAGNOSIS — I498 Other specified cardiac arrhythmias: Secondary | ICD-10-CM

## 2015-01-01 DIAGNOSIS — I951 Orthostatic hypotension: Secondary | ICD-10-CM | POA: Diagnosis not present

## 2015-01-01 DIAGNOSIS — I480 Paroxysmal atrial fibrillation: Secondary | ICD-10-CM | POA: Diagnosis not present

## 2015-01-01 DIAGNOSIS — I484 Atypical atrial flutter: Secondary | ICD-10-CM

## 2015-01-01 DIAGNOSIS — I495 Sick sinus syndrome: Secondary | ICD-10-CM | POA: Diagnosis not present

## 2015-01-01 DIAGNOSIS — Z950 Presence of cardiac pacemaker: Secondary | ICD-10-CM

## 2015-01-01 DIAGNOSIS — I2510 Atherosclerotic heart disease of native coronary artery without angina pectoris: Secondary | ICD-10-CM

## 2015-01-01 MED ORDER — METOPROLOL SUCCINATE ER 25 MG PO TB24: 25.0000 mg | ORAL_TABLET | Freq: Two times a day (BID) | ORAL | Status: DC

## 2015-01-01 NOTE — Progress Notes (Signed)
Electrophysiology Office Note   Date:  01/01/2015   ID:  FREMONT SKALICKY, DOB 1934/10/06, MRN [id number]  PCP:  Limmie Patricia, MD  Primary Electrophysiologist:  Dr Lovena Le  Chief Complaint  Patient presents with  . PAF  . Atrial Flutter     History of Present Illness: [name] is a 80 y.o. male who presents today for electrophysiology evaluation.   The patient has had complex cardiac history.  More recently his issues have been with labile BP/ dysautonomia, sinus bradycardia, atrial fibrillation, atrial flutter (atypical) and PVCs.  He recently wore an event monitor which revealed high PVC burden as well as NSVT.  The patient has also had atrial arrhythmias but feels that he has actually been more symptomatic with PVCs.  He was seen recently by Dr Glennon Mac at [facility] and reports that ablation was discussed.  Overall, he is doing reasonably well.  He remains active.  Today, he denies symptoms of chest pain, shortness of breath, orthopnea, PND, lower extremity edema, claudication, dizziness, presyncope, syncope, bleeding, or neurologic sequela. The patient is tolerating medications without difficulties and is otherwise without complaint today.    Past Medical History  Diagnosis Date  . 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  . Coronary artery disease     status post multiple prior percutaneous coronary interventions, microvascular angina per Dr Olevia Perches  . Aortic stenosis     moderate aortic stenosis  . Hyperlipidemia   . Hypercoagulable state ([place])     chronically anticoagulated with coumadin  . Heart murmur   . Stroke ([place])     1990  . Hyperthyroidism   . Benign prostatic hypertrophy   . Arthritis   . Paroxysmal atrial fibrillation ([place])     DR. Lia Foyer,   . Hypothyroidism     Dr. Elyse Hsu  . MGUS  (monoclonal gammopathy of unknown significance) 02/17/2013   Past Surgical History  Procedure Laterality Date  . Pacemaker insertion  1991    Guidant PPM, most recent Generator Change by Dr Olevia Perches was 08/22/06  . Hemrrhoidectomy    . Cardiac catheterization      11  . Cardioversion    . Appendectomy    . Aortic valve replacement  03/15/2011    Procedure: AORTIC VALVE REPLACEMENT (AVR);  Surgeon: Gaye Pollack, MD;  Location: [place];  Service: Open Heart Surgery;  Laterality: N/A;  . Maze  03/15/2011    Procedure: MAZE;  Surgeon: Gaye Pollack, MD;  Location: [place];  Service: Open Heart Surgery;  Laterality: N/A;  . Tee without cardioversion  04/15/2011    Procedure: TRANSESOPHAGEAL ECHOCARDIOGRAM (TEE);  Surgeon: Loralie Champagne, MD;  Location: [place];  Service: Cardiovascular;  Laterality: N/A;  . Cardioversion  04/15/2011    Procedure: CARDIOVERSION;  Surgeon: Loralie Champagne, MD;  Location: [facility] ENDOSCOPY;  Service: Cardiovascular;  Laterality: N/A;  . Left and right heart catheterization with coronary angiogram Bilateral 02/01/2011    Procedure: LEFT AND RIGHT HEART CATHETERIZATION WITH CORONARY ANGIOGRAM;  Surgeon: Hillary Bow, MD;  Location: [facility] CATH LAB;  Service: Cardiovascular;  Laterality: Bilateral;  . Tee without cardioversion N/A 09/11/2014    Procedure: TRANSESOPHAGEAL ECHOCARDIOGRAM (TEE);  Surgeon: Sueanne Margarita, MD;  Location: [place];  Service: Cardiovascular;  Laterality: N/A;  . Cardioversion N/A 09/11/2014    Procedure: CARDIOVERSION;  Surgeon: Eber Hong  Radford Pax, MD;  Location: [place] ENDOSCOPY;  Service: Cardiovascular;  Laterality: N/A;     Current Outpatient Prescriptions  Medication Sig Dispense Refill  . apixaban (ELIQUIS) 5 MG TABS tablet Take 5 mg by mouth 2 (two) times daily.    . bisacodyl (DULCOLAX) 5 MG EC tablet Take 5 mg by mouth daily as needed (constipation).     . butalbital-acetaminophen-caffeine (FIORICET, ESGIC) 50-325-40 MG per tablet Take 1 tablet  by mouth 2 (two) times daily.     . Coenzyme Q10 (COQ10) 100 MG CAPS Take 100 mg by mouth daily.    . Diclofenac Sodium 1.5 % SOLN Take 1 application by mouth daily as needed (pain).     Marland Kitchen doxazosin (CARDURA) 4 MG tablet Take 4 mg by mouth daily.    . fish oil-omega-3 fatty acids 1000 MG capsule Take 1 g by mouth as directed.     Marland Kitchen HYDROcodone-acetaminophen (NORCO) 10-325 MG per tablet Take 0.5-1 tablet by mouth twice a day    . levothyroxine (SYNTHROID, LEVOTHROID) 50 MCG tablet Take 25 mcg by mouth as directed.     Marland Kitchen LORazepam (ATIVAN) 1 MG tablet Take one tablet by mouth at bedtime. For anxiety    . metoprolol succinate (TOPROL-XL) 25 MG 24 hr tablet Take 1 tablet (25 mg total) by mouth 2 (two) times daily. 180 tablet 3  . nitroGLYCERIN (NITROSTAT) 0.4 MG SL tablet Place 1 tablet (0.4 mg total) under the tongue every 5 (five) minutes as needed for chest pain. 25 tablet 3  . potassium chloride SA (K-DUR,KLOR-CON) 20 MEQ tablet TAKE 1 TABLET BY MOUTH ONCE DAILY 90 tablet 1  . rosuvastatin (CRESTOR) 20 MG tablet Take 1 tablet (20 mg total) by mouth at bedtime. 90 tablet 3   No current facility-administered medications for this visit.    Allergies:   Contrast media   Social History:  The patient  reports that he has never smoked. He does not have any smokeless tobacco history on file. He reports that he does not drink alcohol or use illicit drugs.   Family History:  The patient's family history includes Heart disease in his brother. There is no history of Anesthesia problems, Hypotension, Malignant hyperthermia, or Pseudochol deficiency.    ROS:  Please see the history of present illness.   All other systems are reviewed and negative.    PHYSICAL EXAM: VS:  BP 128/78 mmHg  Pulse 63  Ht 6' 1.5" (1.867 m)  Wt 220 lb (99.791 kg)  BMI 28.63 kg/m2 , BMI Body mass index is 28.63 kg/(m^2). GEN: Well nourished, well developed, in no acute distress HEENT: normal Neck: no JVD, carotid bruits,  or masses Cardiac: RRR  Respiratory:  clear to auscultation bilaterally, normal work of breathing GI: soft, nontender, nondistended, + BS MS: no deformity or atrophy Skin: warm and dry, device pocket is well healed Neuro:  Strength and sensation are intact Psych: euthymic mood, full affect  EKG:  EKG is ordered today. The ekg ordered today shows atrial pacing 60 bpm with frequent PVCs.  First degree AV block with RBBB/ LAHB.  PVC morphology is LBB inferior axis with transition V1-V2  Device interrogation is reviewed today in detail.  See PaceArt for details.   Recent Labs: 09/10/2014: BUN 10; Creatinine, Ser 1.01; Hemoglobin 13.5; Platelets 225; Potassium 4.0; Sodium 138; TSH 2.203    Lipid Panel     Component Value Date/Time   CHOL 259* 09/10/2014 1530   TRIG 113 09/10/2014 1530  HDL 77 09/10/2014 1530   CHOLHDL 3.4 09/10/2014 1530   VLDL 23 09/10/2014 1530   LDLCALC 159* 09/10/2014 1530   LDLDIRECT 117.9 11/16/2010 1355     Wt Readings from Last 3 Encounters:  01/01/15 220 lb (99.791 kg)  12/26/14 225 lb (102.059 kg)  09/10/14 266 lb (120.657 kg)      Other studies Reviewed: Additional studies/ records that were reviewed today include: Dr Forde Dandy notes, echo from 2015   [place]:  1.  Paroxysmal atrial fibrillation/ atypical atrial flutter The patient has significant atriopathy with severe LA enlargement previously and is already s/p MAZE.  I would anticipate that our ability to maintain sinus rhythm without AAD therapy is very low.  Success rates with ablation is modest at best.   Presently, the patient would like to avoid ablation. I will obtain an echo to evaluate for structural changes over the past year related to AF.  Once results are obtained, I will discuss AAD options with Dr Lovena Le and we will proceed accordingly.  Our options would be multaq, sotalol, or tikosyn.  He has previously failed medical therapy with amiodarone. Continue long term  anticoagulation  2. NSVT/PVCs Obtain echo As he is more symptomatic presently with PVCs than afib, I would favor AAD therapy which could hopefully improve both atrial and ventricular arrhythmias.  3. SSS Normal pacemaker function See Pace Art report No changes today  4. HTN Stable No change required today BP has been quite labile Dr Lovena Le to manage  Follow-up with Dr Lovena Le after echo results are obtained to determine appropriate strategy of either watchful waiting or initiation of AAD therapy.  Current medicines are reviewed at length with the patient today.   The patient does not have concerns regarding his medicines.  The following changes were made today:  none  Labs/ tests ordered today include:  Orders Placed This Encounter  Procedures  . EKG 12-Lead  . ECHOCARDIOGRAM COMPLETE   Today, I have spent 40  minutes with the patient discussing arrhythmia management.  More than 50% of the visit time today was spent on this issue.  Very complex patient with multiple medical issues.  A high level of decision making was required for this visit today.   Army Fossa, MD  01/01/2015 10:03 PM     [address] [place] [address] [place] [address] [phone] (office) [phone] (fax)

## 2015-01-01 NOTE — Patient Instructions (Addendum)
Medication Instructions:  Your physician has recommended you make the following change in your medication:  1)Increase Metoprolol to 25 mg twice daily   Labwork: None ordered   Testing/Procedures: Your physician has requested that you have an echocardiogram. Echocardiography is a painless test that uses sound waves to create images of your heart. It provides your doctor with information about the size and shape of your heart and how well your heart's chambers and valves are working. This procedure takes approximately one hour. There are no restrictions for this procedure.    Follow-Up: Your physician recommends that you schedule a follow-up appointment in: 4 weeks with Dr Lovena Le   Any Other Special Instructions Will Be Listed Below (If Applicable).     If you need a refill on your cardiac medications before your next appointment, please call your pharmacy.

## 2015-01-03 LAB — CUP PACEART INCLINIC DEVICE CHECK
Battery Remaining Longevity: 12 mo
Brady Statistic RA Percent Paced: 79 %
Implantable Lead Implant Date: 19910315
Implantable Lead Implant Date: 19910315
Implantable Lead Location: 753859
Lead Channel Impedance Value: 420 Ohm
Lead Channel Pacing Threshold Pulse Width: 1 ms
Lead Channel Sensing Intrinsic Amplitude: 2.3 mV
MDC IDC LEAD LOCATION: 753860
MDC IDC MSMT LEADCHNL RA PACING THRESHOLD AMPLITUDE: 1.9 V
MDC IDC PG SERIAL: 113171
MDC IDC SESS DTM: 20161104134104
MDC IDC SET LEADCHNL RA PACING AMPLITUDE: 3.5 V

## 2015-01-07 ENCOUNTER — Encounter: Payer: Self-pay | Admitting: Internal Medicine

## 2015-01-09 ENCOUNTER — Other Ambulatory Visit: Payer: Self-pay

## 2015-01-09 ENCOUNTER — Ambulatory Visit (HOSPITAL_COMMUNITY): Payer: Medicare Other | Attending: Cardiology

## 2015-01-09 DIAGNOSIS — I480 Paroxysmal atrial fibrillation: Secondary | ICD-10-CM

## 2015-01-09 DIAGNOSIS — E785 Hyperlipidemia, unspecified: Secondary | ICD-10-CM | POA: Diagnosis not present

## 2015-01-09 DIAGNOSIS — I4891 Unspecified atrial fibrillation: Secondary | ICD-10-CM | POA: Diagnosis not present

## 2015-01-09 DIAGNOSIS — I059 Rheumatic mitral valve disease, unspecified: Secondary | ICD-10-CM | POA: Diagnosis not present

## 2015-01-09 DIAGNOSIS — I517 Cardiomegaly: Secondary | ICD-10-CM | POA: Diagnosis not present

## 2015-01-30 ENCOUNTER — Encounter: Payer: Self-pay | Admitting: Internal Medicine

## 2015-01-30 ENCOUNTER — Ambulatory Visit (INDEPENDENT_AMBULATORY_CARE_PROVIDER_SITE_OTHER): Payer: Medicare Other | Admitting: Internal Medicine

## 2015-01-30 VITALS — BP 92/68 | HR 60 | Ht 73.5 in

## 2015-01-30 DIAGNOSIS — I4819 Other persistent atrial fibrillation: Secondary | ICD-10-CM

## 2015-01-30 DIAGNOSIS — I493 Ventricular premature depolarization: Secondary | ICD-10-CM | POA: Diagnosis not present

## 2015-01-30 DIAGNOSIS — I495 Sick sinus syndrome: Secondary | ICD-10-CM

## 2015-01-30 DIAGNOSIS — I481 Persistent atrial fibrillation: Secondary | ICD-10-CM | POA: Diagnosis not present

## 2015-01-30 DIAGNOSIS — I2510 Atherosclerotic heart disease of native coronary artery without angina pectoris: Secondary | ICD-10-CM | POA: Diagnosis not present

## 2015-01-30 DIAGNOSIS — Z950 Presence of cardiac pacemaker: Secondary | ICD-10-CM | POA: Diagnosis not present

## 2015-01-30 LAB — CUP PACEART INCLINIC DEVICE CHECK
Brady Statistic RA Percent Paced: 95 %
Brady Statistic RV Percent Paced: 72 %
Date Time Interrogation Session: 20161201050000
Implantable Lead Implant Date: 19910315
Implantable Lead Implant Date: 19910315
Implantable Lead Location: 753859
Lead Channel Pacing Threshold Amplitude: 1.5 V
MDC IDC LEAD LOCATION: 753860
MDC IDC MSMT LEADCHNL RA IMPEDANCE VALUE: 520 Ohm
MDC IDC MSMT LEADCHNL RA PACING THRESHOLD PULSEWIDTH: 1 ms
MDC IDC MSMT LEADCHNL RA SENSING INTR AMPL: 2.3 mV
MDC IDC SET LEADCHNL RA PACING AMPLITUDE: 3.5 V
Pulse Gen Serial Number: 567685

## 2015-01-30 MED ORDER — DRONEDARONE HCL 400 MG PO TABS: 400.0000 mg | ORAL_TABLET | Freq: Two times a day (BID) | ORAL | Status: DC

## 2015-01-30 NOTE — Assessment & Plan Note (Signed)
We will treat PVC's with multaq.

## 2015-01-30 NOTE — Assessment & Plan Note (Signed)
He is asymptomatic, s/p PPM insertion.

## 2015-01-30 NOTE — Assessment & Plan Note (Signed)
His symptoms are not progressing. Will follow.

## 2015-01-30 NOTE — Assessment & Plan Note (Signed)
His [employer] PM is working normally. He does have some noise on atrial lead and we have attempted to program around this to avoid oversensing of atrial noise.If this persists, will plan on changing PPM out earlier.

## 2015-01-30 NOTE — Assessment & Plan Note (Signed)
He has been reasonably well controlled. However with his ventricular ectopy, he is asymptomatic. He will undergo a trial of Multaq.

## 2015-01-30 NOTE — Progress Notes (Signed)
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 The patient has an extensive cardiac history with long-standing hypertension and sinus node dysfunction, status post pacemaker insertion, CAD, s/p multiple interventions, PAF, and is s/p AVR for aortic stenosis with surgical MAZE. The patient had maintained sinus rhythm on amiodarone therapy. He developed thyroid dysfunction and possible pulmonary toxicity and his amio has been stopped for over a year. He has had PAF and symptomatic PVC's. He was seen by Dr. Greggory Brandy and thought not to have enough symptoms to undergo catheter ablation of atrial fib. He appears to be bothered most by his PVC's.   His holter monitor demonstrated about 11k in 24 hours. Allergies  Allergen Reactions  . Contrast Media [Iodinated Diagnostic Agents] Hives     Current Outpatient Prescriptions  Medication Sig Dispense Refill  . apixaban (ELIQUIS) 5 MG TABS tablet Take 5 mg by mouth 2 (two) times daily.    . bisacodyl (DULCOLAX) 5 MG EC tablet Take 5 mg by mouth daily as needed (constipation).     . butalbital-acetaminophen-caffeine (FIORICET, ESGIC) 50-325-40 MG per tablet Take 1 tablet by mouth 2 (two) times daily.     . Coenzyme Q10 (COQ10) 100 MG CAPS Take 100 mg by mouth daily.    . Diclofenac Sodium 1.5 % SOLN Take 1 application by mouth daily as needed (pain).     Marland Kitchen doxazosin (CARDURA) 4 MG tablet Take 4 mg by mouth daily.    Marland Kitchen HYDROcodone-acetaminophen (NORCO) 10-325 MG per tablet Take 0.5-1 tablet by mouth twice a day    . LORazepam (ATIVAN) 1 MG tablet Take one tablet by mouth at bedtime. For anxiety    . metoprolol succinate (TOPROL-XL) 25 MG 24 hr tablet Take 1 tablet (25 mg total) by mouth 2 (two) times daily. 180 tablet 3  . nitroGLYCERIN (NITROSTAT) 0.4 MG SL tablet Place 1 tablet (0.4 mg total) under the tongue every 5 (five) minutes as needed for chest pain. 25 tablet 3  . potassium chloride SA (K-DUR,KLOR-CON) 20 MEQ tablet TAKE 1 TABLET BY MOUTH  ONCE DAILY 90 tablet 1  . rosuvastatin (CRESTOR) 20 MG tablet Take 1 tablet (20 mg total) by mouth at bedtime. 90 tablet 3  . dronedarone (MULTAQ) 400 MG tablet Take 1 tablet (400 mg total) by mouth 2 (two) times daily with a meal. 180 tablet 3   No current facility-administered medications for this visit.     Past Medical History  Diagnosis Date  . 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  . Coronary artery disease     status post multiple prior percutaneous coronary interventions, microvascular angina per Dr Olevia Perches  . Aortic stenosis     moderate aortic stenosis  . Hyperlipidemia   . Hypercoagulable state ([place])     chronically anticoagulated with coumadin  . Heart murmur   . Stroke ([place])     1990  . Hyperthyroidism   . Benign prostatic hypertrophy   . Arthritis   . Paroxysmal atrial fibrillation ([place])     DR. Lia Foyer,   . Hypothyroidism     Dr. Elyse Hsu  . MGUS (monoclonal gammopathy of unknown significance) 02/17/2013    ROS:   All systems reviewed and negative except as noted in the HPI.   Past Surgical History  Procedure Laterality Date  . Pacemaker insertion  1991  Guidant PPM, most recent Generator Change by Dr Olevia Perches was 08/22/06  . Hemrrhoidectomy    . Cardiac catheterization      11  . Cardioversion    . Appendectomy    . Aortic valve replacement  03/15/2011    Procedure: AORTIC VALVE REPLACEMENT (AVR);  Surgeon: Gaye Pollack, MD;  Location: [place];  Service: Open Heart Surgery;  Laterality: N/A;  . Maze  03/15/2011    Procedure: MAZE;  Surgeon: Gaye Pollack, MD;  Location: [place];  Service: Open Heart Surgery;  Laterality: N/A;  . Tee without cardioversion  04/15/2011    Procedure: TRANSESOPHAGEAL ECHOCARDIOGRAM (TEE);  Surgeon: Loralie Champagne, MD;  Location: [place];  Service: Cardiovascular;   Laterality: N/A;  . Cardioversion  04/15/2011    Procedure: CARDIOVERSION;  Surgeon: Loralie Champagne, MD;  Location: [facility] ENDOSCOPY;  Service: Cardiovascular;  Laterality: N/A;  . Left and right heart catheterization with coronary angiogram Bilateral 02/01/2011    Procedure: LEFT AND RIGHT HEART CATHETERIZATION WITH CORONARY ANGIOGRAM;  Surgeon: Hillary Bow, MD;  Location: [facility] CATH LAB;  Service: Cardiovascular;  Laterality: Bilateral;  . Tee without cardioversion N/A 09/11/2014    Procedure: TRANSESOPHAGEAL ECHOCARDIOGRAM (TEE);  Surgeon: Sueanne Margarita, MD;  Location: [facility] ENDOSCOPY;  Service: Cardiovascular;  Laterality: N/A;  . Cardioversion N/A 09/11/2014    Procedure: CARDIOVERSION;  Surgeon: Sueanne Margarita, MD;  Location: [facility] ENDOSCOPY;  Service: Cardiovascular;  Laterality: N/A;     Family History  Problem Relation Age of Onset  . Heart disease Brother     Twin brother has coronary disease and recent AVR for AS  . Anesthesia problems Neg Hx   . Hypotension Neg Hx   . Malignant hyperthermia Neg Hx   . Pseudochol deficiency Neg Hx      Social History   Social History  . Marital Status: Married    Spouse Name: N/A  . Number of Children: N/A  . Years of Education: N/A   Occupational History  . Not on file.   Social History Main Topics  . Smoking status: Never Smoker   . Smokeless tobacco: Not on file  . Alcohol Use: No     Comment: occassional  . Drug Use: No  . Sexual Activity: Not on file   Other Topics Concern  . Not on file   Social History Narrative     BP 92/68 mmHg  Pulse 60  Ht 6' 1.5" (1.867 m)  Physical Exam:  Well appearing 80 year old man, NAD HEENT: Unremarkable Neck:  6 cm JVD, no thyromegally Back:  No CVA tenderness Lungs:  Clear with no wheezes, rales, or rhonchi. HEART:  Regular brady rhythm, split S2. Abd:  soft, positive bowel sounds, no organomegally, no rebound, no guarding Ext:  2 plus pulses, no edema, no cyanosis, no clubbing Skin:   No rashes no nodules Neuro:  CN II through XII intact, motor grossly intact   DEVICE  Normal device function.  See PaceArt for details. He has had some oversensing of atrial lead noise.   Assess/Plan:

## 2015-01-30 NOTE — Patient Instructions (Addendum)
Medication Instructions: 1) Start Multaq 400 mg one tablet by mouth twice daily  Labwork: - none  Procedures/Testing: - none  Follow-Up: - Your physician wants you to follow-up in: late December with the device clinic. You will receive a reminder letter in the mail two months in advance. If you don't receive a letter, please call our office to schedule the follow-up appointment.  Any Additional Special Instructions Will Be Listed Below (If Applicable). - none

## 2015-02-11 ENCOUNTER — Encounter: Payer: Self-pay | Admitting: Internal Medicine

## 2015-02-26 ENCOUNTER — Encounter: Payer: Self-pay | Admitting: *Deleted

## 2015-02-26 ENCOUNTER — Ambulatory Visit (INDEPENDENT_AMBULATORY_CARE_PROVIDER_SITE_OTHER): Payer: Medicare Other | Admitting: *Deleted

## 2015-02-26 ENCOUNTER — Encounter: Payer: Self-pay | Admitting: Internal Medicine

## 2015-02-26 DIAGNOSIS — Z45010 Encounter for checking and testing of cardiac pacemaker pulse generator [battery]: Secondary | ICD-10-CM

## 2015-02-26 LAB — CUP PACEART INCLINIC DEVICE CHECK
Brady Statistic RA Percent Paced: 89 %
Date Time Interrogation Session: 20161228050000
Implantable Lead Location: 753859
Implantable Lead Location: 753860
Lead Channel Setting Pacing Amplitude: 3.5 V
MDC IDC LEAD IMPLANT DT: 19910315
MDC IDC LEAD IMPLANT DT: 19910315
MDC IDC STAT BRADY RV PERCENT PACED: 0 %
Pulse Gen Serial Number: 567685

## 2015-02-26 NOTE — Progress Notes (Signed)
Battery check only prior to leaving country [place]mo. Remaining longevity 52yr. PVC count not noted on interrogation due to AAIR mode. In office interrogation does show frequent skipped V-events. EGM shows atrial event without ventricular response (V-sensing not changed)---pt noted PVC symptom during V-isoelectric timeframes. Pt not inclined to reattempt DDD mode, previously had it on but was symptomatic. No changes made. Pt will make appt in approx 54mo (billable) for next battery check w/ device clinic.

## 2015-03-04 MED FILL — KLOR-CON M20 TABLET: 20 | 90 days supply | Qty: 90 | Fill #1

## 2015-03-04 MED FILL — HYDROCODON-APAP 10-325: 10-325 | 50 days supply | Qty: 100 | Fill #0

## 2015-03-04 MED FILL — DOXAZOSIN MESYLATE 4 MG TAB: 4 | 30 days supply | Qty: 30 | Fill #0

## 2015-03-04 MED FILL — ELIQUIS 5 MG TABLET: 5 | 30 days supply | Qty: 60 | Fill #3

## 2015-03-04 MED FILL — ROSUVASTATIN CALCIUM 20 MG: 20 | 90 days supply | Qty: 90 | Fill #2

## 2015-03-04 MED FILL — BUTALB-ACETAMIN-CAFF 50-325: 50-325-40 | 30 days supply | Qty: 60 | Fill #3

## 2015-03-04 MED FILL — LORazepam 1 MG TABS: 1 | 30 days supply | Qty: 30 | Fill #3

## 2015-04-02 DIAGNOSIS — M19012 Primary osteoarthritis, left shoulder: Secondary | ICD-10-CM | POA: Diagnosis not present

## 2015-04-02 DIAGNOSIS — M19011 Primary osteoarthritis, right shoulder: Secondary | ICD-10-CM | POA: Diagnosis not present

## 2015-04-02 MED FILL — BUTALB-ACETAMIN-CAFF 50-325: 50-325-40 | 30 days supply | Qty: 60 | Fill #4

## 2015-04-02 MED FILL — LORazepam 1 MG TABS: 1 | 30 days supply | Qty: 30 | Fill #4

## 2015-04-02 MED FILL — ELIQUIS 5 MG TABLET: 5 | 30 days supply | Qty: 60 | Fill #4

## 2015-04-02 MED FILL — METOPROLOL SUCC ER 25 MG TA: 25 | 90 days supply | Qty: 180 | Fill #1

## 2015-04-03 ENCOUNTER — Other Ambulatory Visit: Payer: Self-pay | Admitting: Endocrinology

## 2015-04-03 DIAGNOSIS — Z5181 Encounter for therapeutic drug level monitoring: Secondary | ICD-10-CM | POA: Diagnosis not present

## 2015-04-03 DIAGNOSIS — M4302 Spondylolysis, cervical region: Secondary | ICD-10-CM | POA: Diagnosis not present

## 2015-04-03 DIAGNOSIS — M25511 Pain in right shoulder: Secondary | ICD-10-CM | POA: Diagnosis not present

## 2015-04-03 DIAGNOSIS — M19041 Primary osteoarthritis, right hand: Secondary | ICD-10-CM | POA: Diagnosis not present

## 2015-04-03 DIAGNOSIS — R142 Eructation: Secondary | ICD-10-CM

## 2015-04-03 DIAGNOSIS — M170 Bilateral primary osteoarthritis of knee: Secondary | ICD-10-CM | POA: Diagnosis not present

## 2015-04-03 DIAGNOSIS — Z036 Encounter for observation for suspected toxic effect from ingested substance ruled out: Secondary | ICD-10-CM | POA: Diagnosis not present

## 2015-04-03 MED FILL — DOXAZOSIN MESYLATE 4 MG TAB: 4 | 30 days supply | Qty: 30 | Fill #0

## 2015-04-04 ENCOUNTER — Other Ambulatory Visit (HOSPITAL_COMMUNITY): Payer: Medicare Other

## 2015-04-04 ENCOUNTER — Ambulatory Visit (HOSPITAL_COMMUNITY)
Admission: RE | Admit: 2015-04-04 | Discharge: 2015-04-04 | Disposition: A | Discharge status: Home / Self Care | Payer: Medicare Other | Source: Ambulatory Visit | Attending: Endocrinology | Admitting: Endocrinology

## 2015-04-04 DIAGNOSIS — R142 Eructation: Secondary | ICD-10-CM

## 2015-04-04 DIAGNOSIS — R1310 Dysphagia, unspecified: Secondary | ICD-10-CM | POA: Diagnosis not present

## 2015-04-04 DIAGNOSIS — K224 Dyskinesia of esophagus: Secondary | ICD-10-CM | POA: Insufficient documentation

## 2015-04-04 DIAGNOSIS — K219 Gastro-esophageal reflux disease without esophagitis: Secondary | ICD-10-CM | POA: Insufficient documentation

## 2015-04-04 DIAGNOSIS — R066 Hiccough: Secondary | ICD-10-CM | POA: Insufficient documentation

## 2015-04-08 DIAGNOSIS — H25819 Combined forms of age-related cataract, unspecified eye: Secondary | ICD-10-CM | POA: Diagnosis not present

## 2015-04-08 DIAGNOSIS — H5053 Vertical heterophoria: Secondary | ICD-10-CM | POA: Diagnosis not present

## 2015-04-10 ENCOUNTER — Telehealth: Payer: Self-pay | Admitting: *Deleted

## 2015-04-10 ENCOUNTER — Other Ambulatory Visit: Payer: Self-pay | Admitting: Oncology

## 2015-04-10 DIAGNOSIS — D472 Monoclonal gammopathy: Secondary | ICD-10-CM

## 2015-04-10 NOTE — Telephone Encounter (Signed)
Lab appt tomorrow@ 1000 AM and sees Dr Beryle Beams 2/14.

## 2015-04-10 NOTE — Telephone Encounter (Signed)
-----   Message from Annia Belt, MD sent at 04/10/2015 11:52 AM EST ----- I put lab orders in for tomorrow 2/10; we can do an add on visit on Tuesday 2/14 @ 3:30 PM - need a few days to get lab results back so if he can't get labs tomorrow - would not schedule MD visit until nex Mon or Tues 2/20 or 2/21 ----- Message -----    From: Ebbie Latus, RN    Sent: 04/10/2015   9:45 AM      To: Lesia Hausen, MD  Received telephone message from Dr Bernita Buffy - stated he needed an appt to see Dr Beryle Beams and labs? Telephone #[phone] Thanks Holley Raring

## 2015-04-10 NOTE — Telephone Encounter (Signed)
Called pt to scheduled lab appt for tomorrow 2/10 - no answer; left message to [facility].

## 2015-04-11 ENCOUNTER — Other Ambulatory Visit (INDEPENDENT_AMBULATORY_CARE_PROVIDER_SITE_OTHER): Payer: Medicare Other

## 2015-04-11 DIAGNOSIS — D472 Monoclonal gammopathy: Secondary | ICD-10-CM | POA: Diagnosis not present

## 2015-04-12 LAB — CBC WITH DIFFERENTIAL/PLATELET
BASOS ABS: 0 10*3/uL (ref 0.0–0.2)
Basos: 0 %
EOS (ABSOLUTE): 0.2 10*3/uL (ref 0.0–0.4)
EOS: 4 %
HEMOGLOBIN: 12.3 g/dL — AB (ref 12.6–17.7)
Hematocrit: 37.3 % — ABNORMAL LOW (ref 37.5–51.0)
IMMATURE GRANS (ABS): 0 10*3/uL (ref 0.0–0.1)
Immature Granulocytes: 0 %
LYMPHS: 10 %
Lymphocytes Absolute: 0.7 10*3/uL (ref 0.7–3.1)
MCH: 28.5 pg (ref 26.6–33.0)
MCHC: 33 g/dL (ref 31.5–35.7)
MCV: 87 fL (ref 79–97)
MONOCYTES: 6 %
Monocytes Absolute: 0.4 10*3/uL (ref 0.1–0.9)
Neutrophils Absolute: 5 10*3/uL (ref 1.4–7.0)
Neutrophils: 80 %
Platelets: 210 10*3/uL (ref 150–379)
RBC: 4.31 x10E6/uL (ref 4.14–5.80)
RDW: 14.1 % (ref 12.3–15.4)
WBC: 6.2 10*3/uL (ref 3.4–10.8)

## 2015-04-14 LAB — IMMUNOFIXATION ELECTROPHORESIS
IGA/IMMUNOGLOBULIN A, SERUM: 151 mg/dL (ref 61–437)
IGG (IMMUNOGLOBIN G), SERUM: 751 mg/dL (ref 700–1600)
IgM (Immunoglobulin M), Srm: 638 mg/dL — ABNORMAL HIGH (ref 15–143)
TOTAL PROTEIN: 6.7 g/dL (ref 6.0–8.5)

## 2015-04-14 LAB — KAPPA/LAMBDA LIGHT CHAINS
Ig Kappa Free Light Chain: 22.44 mg/L — ABNORMAL HIGH (ref 3.30–19.40)
Ig Lambda Free Light Chain: 24.41 mg/L (ref 5.71–26.30)
KAPPA/LAMBDA FLC RATIO: 0.92 (ref 0.26–1.65)

## 2015-04-15 ENCOUNTER — Ambulatory Visit (INDEPENDENT_AMBULATORY_CARE_PROVIDER_SITE_OTHER): Payer: Medicare Other | Admitting: Oncology

## 2015-04-15 ENCOUNTER — Encounter: Payer: Self-pay | Admitting: Oncology

## 2015-04-15 VITALS — BP 161/91 | HR 78 | Temp 97.5°F | Ht 73.0 in | Wt 225.0 lb

## 2015-04-15 DIAGNOSIS — D472 Monoclonal gammopathy: Secondary | ICD-10-CM

## 2015-04-15 NOTE — Patient Instructions (Signed)
Return 1 year Lab 1 week before visit 

## 2015-04-16 ENCOUNTER — Encounter: Payer: Self-pay | Admitting: Oncology

## 2015-04-16 NOTE — Progress Notes (Signed)
Patient ID: [name], male   DOB: October 31, 1934, 81 y.o.   MRN: [id number] Hematology and Oncology Follow Up Visit  [name] [id number] 11-09-1934 81 y.o. 04/16/2015 4:27 PM   Principle Diagnosis: Encounter Diagnosis  Name Primary?  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 IgM level initially done on 01/31/2013 was 494 mg percent (41-251) with normal IgG and IgA. No monoclonal spike in the gamma region. Repeat profile done on 04/05/2013 with IgM 517 mg percent. Immunofixation electrophoresis showed monoclonal lambda restriction. Hemoglobin 13.8. Normal white count and platelet count. Serum albumin 4.3 with total protein 7.6, BUN 18, creatinine 1.1, calcium 9.7. A chest radiograph showed changes from previous pacemaker implant. Other postoperative changes from previous median sternotomy and aortic valvuloplasty. I did not get a bone survey. He had no neurologic signs or symptoms. We discussed a differential diagnosis including benign monoclonal gammopathy, versus early Waldonstrom's macroglobulinemia (well-differentiated B-cell lymphocytic lymphoma), or IgM multiple myeloma. We discussed whether or not a bone marrow would add any insight into the process. Although it might confirm a diagnosis of Waldenstrm's, with a near normal hemoglobin, initial treatment would be observation alone and we mutually decided to defer a bone marrow procedure.     Interim History:   He recently returned from an extended 6 week stay in [place] Needless to say, the [place] are incredulous about our new president. He denied any new neurologic symptoms. 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 No new change in vision. He has developed some intermittent  dysphagia. UGI/Barium swallow done last week show mild esophageal dysmotility; no mass or obstruction. He denied any paresthesias. He remains active, no excessive fatigue. No bleeding or bruising.   Medications: reviewed  Allergies:  Allergies  Allergen Reactions  . Contrast Media [Iodinated Diagnostic Agents] Hives    Review of Systems: See HPI Remaining ROS negative:   Physical Exam: Blood pressure 161/91, pulse 78, temperature 97.5 F (36.4 C), temperature source Oral, height '6\' 1"'  (1.854 m), weight 225 lb (102.059 kg), SpO2 98 %. Wt Readings from Last 3 Encounters:  04/15/15 225 lb (102.059 kg)  01/01/15 220 lb (99.791 kg)  12/26/14 225 lb (102.059 kg)     General appearance: Tall, well-nourished, Caucasian man HENNT: Pharynx no erythema, exudate, mass, or ulcer. No thyromegaly or thyroid nodules Lymph nodes: No cervical, supraclavicular, or axillary lymphadenopathy Breasts: Lungs: Clear to auscultation, resonant to percussion throughout Heart: Regular rhythm, no murmur, no gallop, no rub, no click, no edema Abdomen: Soft, nontender, normal bowel sounds, no mass, no organomegaly Extremities: No edema, no calf tenderness Musculoskeletal: no joint deformities GU:  Vascular: Carotid pulses 2+, no bruits, distal pulses: Dorsalis pedis 1+ symmetric Neurologic: Alert, oriented, PERRLA,I was unable to see his fundi; cranial nerves grossly normal, motor strength 5 over 5, reflexes 1+ symmetric, upper body coordination normal, gait normal,vibration not checked Skin: No rash or ecchymosis  Lab Results: CBC W/Diff    Component Value Date/Time   WBC 6.2 04/11/2015 1013   WBC 7.3 09/10/2014 1530   WBC 6.0 04/05/2013 1158   RBC 4.31 04/11/2015 1013   RBC 4.52 09/10/2014 1530   RBC 4.72 04/05/2013 1158   HGB 13.5 09/10/2014 1530   HGB 13.8 04/05/2013 1158   HCT 37.3* 04/11/2015 1013   HCT 40.5 09/10/2014  1530   HCT 41.5 04/05/2013 1158   PLT 210 04/11/2015 1013   PLT 225  09/10/2014 1530   PLT 206 04/05/2013 1158   MCV 87 04/11/2015 1013   MCV 89.6 09/10/2014 1530   MCV 87.9 04/05/2013 1158   MCH 28.5 04/11/2015 1013   MCH 29.9 09/10/2014 1530   MCH 29.2 04/05/2013 1158   MCHC 33.0 04/11/2015 1013   MCHC 33.3 09/10/2014 1530   MCHC 33.3 04/05/2013 1158   RDW 14.1 04/11/2015 1013   RDW 12.8 09/10/2014 1530   RDW 15.0* 04/05/2013 1158   LYMPHSABS 0.7 04/11/2015 1013   LYMPHSABS 1.4 09/10/2014 1530   LYMPHSABS 0.7* 04/05/2013 1158   MONOABS 0.4 09/10/2014 1530   MONOABS 0.2 04/05/2013 1158   EOSABS 0.2 04/11/2015 1013   EOSABS 0.1 09/10/2014 1530   EOSABS 0.0 04/05/2013 1158   BASOSABS 0.0 04/11/2015 1013   BASOSABS 0.0 09/10/2014 1530   BASOSABS 0.0 04/05/2013 1158     Chemistry      Component Value Date/Time   NA 138 09/10/2014 1530   NA 141 04/05/2013 1159   K 4.0 09/10/2014 1530   K 3.8 04/05/2013 1159   CL 101 09/10/2014 1530   CO2 29 09/10/2014 1530   CO2 30* 04/05/2013 1159   BUN 10 09/10/2014 1530   BUN 18.3 04/05/2013 1159   CREATININE 1.01 09/10/2014 1530   CREATININE 0.88 11/26/2013 1142   CREATININE 1.1 04/05/2013 1159      Component Value Date/Time   CALCIUM 9.3 09/10/2014 1530   CALCIUM 9.7 04/05/2013 1159   ALKPHOS 61 11/26/2013 1142   ALKPHOS 52 04/05/2013 1159   AST 18 11/26/2013 1142   AST 19 04/05/2013 1159   ALT 15 11/26/2013 1142   ALT 16 04/05/2013 1159   BILITOT 0.4 11/26/2013 1142   BILITOT 0.45 04/05/2013 1159    IgM: 638  (15-143) with normal IgG & IgA serum total protein 6.7 gm% = low normal IFE: monoclonal IgM with lambda light chain specificity Kappa free light chains: 22.4 mg% Lambda:24.4 Ratio: 0.92 (0.26-1.65) = normal Hemoglobin 12.3 down from 13.5 on 09/10/14  MCV 87   Radiological Studies: Dg Ugi W/high Density W/kub  04/04/2015  CLINICAL DATA:  Belching.  Dysphagia.  Difficulty swallowing foods. EXAM: UPPER GI SERIES WITH KUB TECHNIQUE: After obtaining a scout radiograph a routine upper GI  series was performed using thin and high density barium. FLUOROSCOPY TIME:  Radiation Exposure Index (as provided by the fluoroscopic device): If the device does not provide the exposure index: Fluoroscopy Time (in minutes and seconds):  2 minutes, 39 seconds Number of Acquired Images:  26 COMPARISON:  Multiple exams, including 01/18/2003 and 10/01/2013 FINDINGS: Initial KUB demonstrates normal bowel gas pattern. Mild lumbar spondylosis. The pharyngeal phase of swallowing appears normal. Cervical spondylosis observed. Primary peristaltic waves in the esophagus were mildly disrupted on 4 out of 4 swallows, with a significant portion of the swallowed bolus of barium static within the upper esophagus. One episode of gastroesophageal reflux was noted during the course of the exam. There is some occasional tertiary contractions in the distal esophagus, as might be seen on series 13. No significant distal esophageal fold thickening. I do not observe an esophageal ulcer or significant erosion. No esophageal mass is observed. No significant esophageal narrowing/stricture. A 13 mm barium tablet passed without difficulty into the stomach. Normal gastric rugal fold pattern. I do not perceive a gastric mass. The antrum and pyloric bulb unremarkable. The normal duodenal fall  pattern. IMPRESSION: 1. Nonspecific esophageal dysmotility disorder. No significant esophageal narrowing or stricture is identified. 2. 1 episode of gastroesophageal reflux during the course of the exam. 3. Cervical and lumbar spondylosis incidentally observed. Electronically Signed   By: Van Clines M.D.   On: 04/04/2015 09:55    Impression:  Slowly rising monoclonal IgM lambda gammopathy. No suppression of IgG and IgA in normal serum free light chain ratio argues against myeloma. This is most likely Waldenstrm's or a marginal zone lymphoma. I do not detect any splenomegaly on exam making marginal zone lymphoma less likely. These are both  low-grade B-cell lymphomas that do not require immediate treatment. Extent of the IgM rise is minimal over a 2-1/2 year interval. Only potential concern is a 1 g drop in his hemoglobin. He is alerted to this fact and will get an interim CBC in 6 months. I feel observation alone remains the preferred approach at this time. I will reevaluate him in one year.          12/14     2/15     9/15     2/17    IgM:  494        517      551      638   CC: Patient Care Team: [name], MD as PCP - General (Endocrinology) Evans Lance, MD (Cardiology) Hillary Bow, MD (Cardiology) Bo Merino, MD as Consulting Physician (Rheumatology)   Annia Belt, MD 2/15/20174:27 PM

## 2015-04-25 DIAGNOSIS — R350 Frequency of micturition: Secondary | ICD-10-CM | POA: Diagnosis not present

## 2015-04-25 DIAGNOSIS — N401 Enlarged prostate with lower urinary tract symptoms: Secondary | ICD-10-CM | POA: Diagnosis not present

## 2015-04-25 DIAGNOSIS — Z0000 Encounter for general adult medical examination without abnormal findings: Secondary | ICD-10-CM | POA: Diagnosis not present

## 2015-04-25 MED FILL — BUTALB-ACETAMIN-CAFF 50-325: 50-325-40 | 30 days supply | Qty: 60 | Fill #5

## 2015-04-25 MED FILL — ELIQUIS 5 MG TABLET: 5 | 30 days supply | Qty: 60 | Fill #5

## 2015-04-29 ENCOUNTER — Encounter: Payer: Medicare Other | Admitting: Oncology

## 2015-04-30 MED FILL — LORazepam 1 MG TABS: 1 | 30 days supply | Qty: 30 | Fill #5

## 2015-05-06 ENCOUNTER — Telehealth: Payer: Self-pay | Admitting: Internal Medicine

## 2015-05-06 ENCOUNTER — Emergency Department (HOSPITAL_COMMUNITY)
Admission: EM | Admit: 2015-05-06 | Discharge: 2015-05-07 | Disposition: A | Discharge status: Home / Self Care | Payer: Medicare Other | Attending: Emergency Medicine | Admitting: Emergency Medicine

## 2015-05-06 ENCOUNTER — Encounter (HOSPITAL_COMMUNITY): Payer: Self-pay | Admitting: Emergency Medicine

## 2015-05-06 ENCOUNTER — Ambulatory Visit: Payer: Self-pay | Admitting: Pharmacist

## 2015-05-06 ENCOUNTER — Encounter: Payer: Self-pay | Admitting: Internal Medicine

## 2015-05-06 ENCOUNTER — Ambulatory Visit (INDEPENDENT_AMBULATORY_CARE_PROVIDER_SITE_OTHER): Payer: Medicare Other | Admitting: Internal Medicine

## 2015-05-06 VITALS — BP 124/92 | Ht 73.0 in

## 2015-05-06 DIAGNOSIS — Z79899 Other long term (current) drug therapy: Secondary | ICD-10-CM | POA: Diagnosis not present

## 2015-05-06 DIAGNOSIS — I4819 Other persistent atrial fibrillation: Secondary | ICD-10-CM

## 2015-05-06 DIAGNOSIS — R011 Cardiac murmur, unspecified: Secondary | ICD-10-CM | POA: Diagnosis not present

## 2015-05-06 DIAGNOSIS — I482 Chronic atrial fibrillation, unspecified: Secondary | ICD-10-CM

## 2015-05-06 DIAGNOSIS — Z8673 Personal history of transient ischemic attack (TIA), and cerebral infarction without residual deficits: Secondary | ICD-10-CM | POA: Diagnosis not present

## 2015-05-06 DIAGNOSIS — E785 Hyperlipidemia, unspecified: Secondary | ICD-10-CM | POA: Insufficient documentation

## 2015-05-06 DIAGNOSIS — R002 Palpitations: Secondary | ICD-10-CM | POA: Diagnosis present

## 2015-05-06 DIAGNOSIS — Z7901 Long term (current) use of anticoagulants: Secondary | ICD-10-CM

## 2015-05-06 DIAGNOSIS — Z862 Personal history of diseases of the blood and blood-forming organs and certain disorders involving the immune mechanism: Secondary | ICD-10-CM | POA: Diagnosis not present

## 2015-05-06 DIAGNOSIS — M1990 Unspecified osteoarthritis, unspecified site: Secondary | ICD-10-CM | POA: Diagnosis not present

## 2015-05-06 DIAGNOSIS — Z9889 Other specified postprocedural states: Secondary | ICD-10-CM | POA: Diagnosis not present

## 2015-05-06 DIAGNOSIS — N400 Enlarged prostate without lower urinary tract symptoms: Secondary | ICD-10-CM | POA: Insufficient documentation

## 2015-05-06 DIAGNOSIS — I495 Sick sinus syndrome: Secondary | ICD-10-CM

## 2015-05-06 DIAGNOSIS — I2510 Atherosclerotic heart disease of native coronary artery without angina pectoris: Secondary | ICD-10-CM | POA: Diagnosis not present

## 2015-05-06 DIAGNOSIS — Z950 Presence of cardiac pacemaker: Secondary | ICD-10-CM | POA: Insufficient documentation

## 2015-05-06 DIAGNOSIS — I480 Paroxysmal atrial fibrillation: Secondary | ICD-10-CM

## 2015-05-06 DIAGNOSIS — R000 Tachycardia, unspecified: Secondary | ICD-10-CM | POA: Diagnosis not present

## 2015-05-06 LAB — CBC WITH DIFFERENTIAL/PLATELET
Basophils Absolute: 0 10*3/uL (ref 0.0–0.1)
Basophils Relative: 0 %
EOS ABS: 0.2 10*3/uL (ref 0.0–0.7)
EOS PCT: 3 %
HCT: 35.4 % — ABNORMAL LOW (ref 39.0–52.0)
Hemoglobin: 11.6 g/dL — ABNORMAL LOW (ref 13.0–17.0)
LYMPHS ABS: 1.1 10*3/uL (ref 0.7–4.0)
Lymphocytes Relative: 15 %
MCH: 28.4 pg (ref 26.0–34.0)
MCHC: 32.8 g/dL (ref 30.0–36.0)
MCV: 86.6 fL (ref 78.0–100.0)
MONO ABS: 0.5 10*3/uL (ref 0.1–1.0)
Monocytes Relative: 8 %
Neutro Abs: 5.2 10*3/uL (ref 1.7–7.7)
Neutrophils Relative %: 74 %
PLATELETS: 162 10*3/uL (ref 150–400)
RBC: 4.09 MIL/uL — AB (ref 4.22–5.81)
RDW: 13.8 % (ref 11.5–15.5)
WBC: 7.1 10*3/uL (ref 4.0–10.5)

## 2015-05-06 LAB — CUP PACEART INCLINIC DEVICE CHECK
Date Time Interrogation Session: 20170307174634
Implantable Lead Implant Date: 19910315
Implantable Lead Location: 753860
MDC IDC LEAD IMPLANT DT: 19910315
MDC IDC LEAD LOCATION: 753859
MDC IDC PG SERIAL: 567685

## 2015-05-06 LAB — BASIC METABOLIC PANEL
Anion gap: 8 (ref 5–15)
BUN: 10 mg/dL (ref 6–20)
CHLORIDE: 108 mmol/L (ref 101–111)
CO2: 24 mmol/L (ref 22–32)
CREATININE: 0.82 mg/dL (ref 0.61–1.24)
Calcium: 8.7 mg/dL — ABNORMAL LOW (ref 8.9–10.3)
GFR calc Af Amer: >60 mL/min (ref >60)
GFR calc non Af Amer: >60 mL/min (ref >60)
GLUCOSE: 121 mg/dL — AB (ref 65–99)
Potassium: 3.9 mmol/L (ref 3.5–5.1)
SODIUM: 140 mmol/L (ref 135–145)

## 2015-05-06 MED ORDER — DILTIAZEM HCL 25 MG/5ML IV SOLN
10.0000 mg | Freq: Once | INTRAVENOUS | Status: AC
  Administered 2015-05-06: 10 mg via INTRAVENOUS
  Filled 2015-05-06: qty 5

## 2015-05-06 MED ORDER — SODIUM CHLORIDE 0.9 % IV BOLUS (SEPSIS)
1000.0000 mL | Freq: Once | INTRAVENOUS | Status: AC
  Administered 2015-05-06: 1000 mL via INTRAVENOUS

## 2015-05-06 MED ORDER — APIXABAN 5 MG PO TABS
5.0000 mg | ORAL_TABLET | Freq: Two times a day (BID) | ORAL | Status: DC
Start: 2015-05-06 — End: 2015-05-07
  Administered 2015-05-06: 5 mg via ORAL
  Filled 2015-05-06: qty 1

## 2015-05-06 MED ORDER — PROPOFOL 10 MG/ML IV BOLUS
1.0000 mg/kg | Freq: Once | INTRAVENOUS | Status: AC
  Administered 2015-05-06: 102.1 mg via INTRAVENOUS
  Filled 2015-05-06: qty 20

## 2015-05-06 MED ORDER — METOPROLOL SUCCINATE ER 25 MG PO TB24
25.0000 mg | ORAL_TABLET | Freq: Every day | ORAL | Status: DC
  Administered 2015-05-06: 25 mg via ORAL
  Filled 2015-05-06: qty 1

## 2015-05-06 MED ORDER — SODIUM CHLORIDE 0.9 % IV SOLN
INTRAVENOUS | Status: DC
  Administered 2015-05-06: 21:00:00 via INTRAVENOUS

## 2015-05-06 NOTE — Telephone Encounter (Signed)
Please call.

## 2015-05-06 NOTE — Telephone Encounter (Signed)
Discussed with Dr Caryl Comes and he will see patient today

## 2015-05-06 NOTE — ED Notes (Signed)
Jacubowitz, MD at bedside.  

## 2015-05-06 NOTE — Sedation Documentation (Signed)
200J syncronized cardioversion shock delivered

## 2015-05-06 NOTE — ED Notes (Signed)
Pt reporting "I feel like Im back in Afib"; Jacubowitz, MD notified

## 2015-05-06 NOTE — Telephone Encounter (Signed)
Never took Multaq and has been doing great since his DCCV in July.   But over the last few days has been in and out of rhythm and has now become persistent.  Makes him feel tired and wants to come in a be pace terminated if possible.  I let him know I would discuss with the device clinic and call back this afternoon as to what the POC will be

## 2015-05-06 NOTE — ED Provider Notes (Signed)
CSN: [id number]     Arrival date & time 05/06/15  2004 History   First MD Initiated Contact with Patient 05/06/15 2007     Chief Complaint  Patient presents with  . Near Syncope  . Palpitations  . Weakness     (Consider location/radiation/quality/duration/timing/severity/associated sxs/prior Treatment) HPI Complains of rapid heartbeat, palpitations onset yesterday, intermittent, waxes and wanes. Denies any shortness of breath and sweatiness chest pain or lightheadedness or near syncope. No treatment prior to coming here. Brought by EMS. Patient seen by Dr. Caryl Comes today. He reports that in the office his rate was well controlled. Arrangements were made for him to be cardioverted electively tomorrow. He reports that after leaving the office his heart rate increased again to 150. Presently he feels his heart racing. No other associated symptoms Past Medical History  Diagnosis Date  . 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  . Coronary artery disease     status post multiple prior percutaneous coronary interventions, microvascular angina per Dr Olevia Perches  . Aortic stenosis     moderate aortic stenosis  . Hyperlipidemia   . Hypercoagulable state ([place])     chronically anticoagulated with coumadin  . Heart murmur   . Stroke ([place])     1990  . Hyperthyroidism   . Benign prostatic hypertrophy   . Arthritis   . Paroxysmal atrial fibrillation ([place])     DR. Lia Foyer,   . Hypothyroidism     Dr. Elyse Hsu  . MGUS (monoclonal gammopathy of unknown significance) 02/17/2013   Past Surgical History  Procedure Laterality Date  . Pacemaker insertion  1991    Guidant PPM, most recent Generator Change by Dr Olevia Perches was 08/22/06  . Hemrrhoidectomy    . Cardiac catheterization      11  . Cardioversion    . Appendectomy    . Aortic valve  replacement  03/15/2011    Procedure: AORTIC VALVE REPLACEMENT (AVR);  Surgeon: Gaye Pollack, MD;  Location: [place];  Service: Open Heart Surgery;  Laterality: N/A;  . Maze  03/15/2011    Procedure: MAZE;  Surgeon: Gaye Pollack, MD;  Location: [place];  Service: Open Heart Surgery;  Laterality: N/A;  . Tee without cardioversion  04/15/2011    Procedure: TRANSESOPHAGEAL ECHOCARDIOGRAM (TEE);  Surgeon: Loralie Champagne, MD;  Location: [place];  Service: Cardiovascular;  Laterality: N/A;  . Cardioversion  04/15/2011    Procedure: CARDIOVERSION;  Surgeon: Loralie Champagne, MD;  Location: [facility] ENDOSCOPY;  Service: Cardiovascular;  Laterality: N/A;  . Left and right heart catheterization with coronary angiogram Bilateral 02/01/2011    Procedure: LEFT AND RIGHT HEART CATHETERIZATION WITH CORONARY ANGIOGRAM;  Surgeon: Hillary Bow, MD;  Location: [facility] CATH LAB;  Service: Cardiovascular;  Laterality: Bilateral;  . Tee without cardioversion N/A 09/11/2014    Procedure: TRANSESOPHAGEAL ECHOCARDIOGRAM (TEE);  Surgeon: Sueanne Margarita, MD;  Location: [facility] ENDOSCOPY;  Service: Cardiovascular;  Laterality: N/A;  . Cardioversion N/A 09/11/2014    Procedure: CARDIOVERSION;  Surgeon: Sueanne Margarita, MD;  Location: [facility] ENDOSCOPY;  Service: Cardiovascular;  Laterality: N/A;   Family History  Problem Relation Age of Onset  . Heart disease Brother     Twin brother has coronary disease and recent AVR for AS  . Anesthesia problems Neg Hx   . Hypotension Neg Hx   . Malignant hyperthermia Neg Hx   .  Pseudochol deficiency Neg Hx    Social History  Substance Use Topics  . Smoking status: Never Smoker   . Smokeless tobacco: None  . Alcohol Use: 0.0 oz/week    0 Standard drinks or equivalent per week     Comment: Rarely.    Review of Systems  Cardiovascular: Positive for palpitations.  All other systems reviewed and are negative.     Allergies  Contrast media  Home Medications   Prior to Admission medications    Medication Sig Start Date End Date Taking? Authorizing Provider  apixaban (ELIQUIS) 5 MG TABS tablet Take 5 mg by mouth 2 (two) times daily. 10/17/14   Historical Provider, MD  bisacodyl (DULCOLAX) 5 MG EC tablet Take 5 mg by mouth daily as needed (constipation).     Historical Provider, MD  butalbital-acetaminophen-caffeine (FIORICET, ESGIC) 50-325-40 MG per tablet Take 1 tablet by mouth 2 (two) times daily.  02/28/13   Historical Provider, MD  Coenzyme Q10 (COQ10) 100 MG CAPS Take 100 mg by mouth daily.    Historical Provider, MD  Diclofenac Sodium 1.5 % SOLN Take 1 application by mouth daily as needed (pain).  02/28/13   Historical Provider, MD  doxazosin (CARDURA) 4 MG tablet Take 4 mg by mouth daily.    Historical Provider, MD  dronedarone (MULTAQ) 400 MG tablet Take 400 mg by mouth 2 (two) times daily with a meal.    Historical Provider, MD  LORazepam (ATIVAN) 1 MG tablet Take one tablet by mouth at bedtime. For anxiety 01/05/12   Hillary Bow, MD  metoprolol succinate (TOPROL-XL) 25 MG 24 hr tablet Take 1 tablet (25 mg total) by mouth 2 (two) times daily. 01/01/15   Thompson Grayer, MD  nitroGLYCERIN (NITROSTAT) 0.4 MG SL tablet Place 1 tablet (0.4 mg total) under the tongue every 5 (five) minutes as needed for chest pain. 10/04/13   Sherren Mocha, MD  potassium chloride SA (K-DUR,KLOR-CON) 20 MEQ tablet TAKE 1 TABLET BY MOUTH ONCE DAILY 11/27/14   Sherren Mocha, MD  rosuvastatin (CRESTOR) 20 MG tablet Take 1 tablet (20 mg total) by mouth at bedtime. 08/20/14 08/20/15  Sherren Mocha, MD   BP 146/114 mmHg  Pulse 146  Temp(Src) 98.3 F (36.8 C) (Oral)  Resp 18  Ht 6\' 1"  (1.854 m)  Wt 225 lb (102.059 kg)  BMI 29.69 kg/m2  SpO2 95% Physical Exam  Constitutional: He appears well-developed and well-nourished. No distress.  HENT:  Head: Normocephalic and atraumatic.  Eyes: Conjunctivae are normal. Pupils are equal, round, and reactive to light.  Neck: Neck supple. No tracheal deviation  present. No thyromegaly present.  Cardiovascular:  No murmur heard. Tachycardic irregularly irregular  Pulmonary/Chest: Effort normal and breath sounds normal.  Abdominal: Soft. Bowel sounds are normal. He exhibits no distension. There is no tenderness.  Musculoskeletal: Normal range of motion. He exhibits no edema or tenderness.  Neurological: He is alert. Coordination normal.  Skin: Skin is warm and dry. No rash noted.  Psychiatric: He has a normal mood and affect.  Nursing note and vitals reviewed.   ED Course  .Cardioversion Date/Time: 05/06/2015 9:44 PM Performed by: Orlie Dakin Authorized by: Orlie Dakin Consent: Verbal consent obtained. Written consent obtained. Risks and benefits: risks, benefits and alternatives were discussed Consent given by: patient Patient understanding: patient states understanding of the procedure being performed Patient consent: the patient's understanding of the procedure matches consent given Procedure consent: procedure consent matches procedure scheduled Relevant documents: relevant documents present and verified Test  results: test results available and properly labeled Required items: required blood products, implants, devices, and special equipment available Patient identity confirmed: verbally with patient, arm band and provided demographic data Patient sedated: yes Sedation type: moderate (conscious) sedation Sedatives: propofol Cardioversion basis: emergent Pre-procedure rhythm: atrial fibrillation Patient position: patient was placed in a supine position Chest area: chest area exposed Electrodes: pads Electrodes placed: anterior-posterior Number of attempts: 1 Attempt 1 mode: synchronous Attempt 1 waveform: biphasic Attempt 1 shock (in Joules): 120 Attempt 1 outcome: conversion to normal sinus rhythm Post-procedure rhythm: normal sinus rhythm Complications: no complications Patient tolerance: Patient tolerated the procedure  well with no immediate complications Comments: Dr Lanice Shirts) present  During cardioversion.  10 PM patient awake alert Glasgow Coma Score 15 asymptomatic   (including critical care time) Labs Review Labs Reviewed - No data to display  Imaging Review No results found. I have personally reviewed and evaluated these images and lab results as part of my medical decision-making.   EKG Interpretation   Date/Time:  Tuesday May 06 2015 20:15:14 EST Ventricular Rate:  147 PR Interval:    QRS Duration: 137 QT Interval:  359 QTC Calculation: 561 R Axis:   -70 Text Interpretation:  Wide-QRS tachycardia RBBB and LAFB Baseline wander  in lead(s) V1 SINCE LAST TRACING HEART RATE HAS INCREASED Confirmed by  Winfred Leeds  MD, Dan Dissinger [phone]) on 05/06/2015 8:19:56 PM     ED ECG REPORT   Date: 05/06/2015   2149pm  Rate: 70  Rhythm: normal sinus rhythm  QRS Axis: left  Intervals: normal  ST/T Wave abnormalities: nonspecific T wave changes  Conduction Disutrbances:right bundle 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  2242 p.m. nurse approached me. Patient's atrial fibrillation returned. 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  Dr Marlowe Sax was reconsulted and briefly saw pt and termed okay for patient to go home with follow-up tomorrow by his  cardiologist Dr. Lovena Le Results for orders placed or performed during the hospital encounter of [id number]  Basic metabolic panel  Result Value Ref Range   Sodium 140 135 - 145 mmol/L   Potassium 3.9 3.5 - 5.1 mmol/L   Chloride 108 101 - 111 mmol/L   CO2 24 22 - 32 mmol/L   Glucose, Bld 121 (H) 65 - 99 mg/dL   BUN 10 6 - 20 mg/dL   Creatinine, Ser 0.82 0.61 - 1.24 mg/dL   Calcium 8.7 (L) 8.9 - 10.3 mg/dL   GFR calc non Af Amer >60 >60 mL/min   GFR calc Af Amer >60 >60 mL/min   Anion gap 8 5 - 15  CBC with Differential/Platelet  Result Value Ref Range   WBC 7.1 4.0 - 10.5 K/uL   RBC 4.09 (L) 4.22 - 5.81 MIL/uL   Hemoglobin 11.6 (L) 13.0 - 17.0 g/dL   HCT 35.4 (L) 39.0 - 52.0 %   MCV 86.6 78.0 - 100.0 fL  MCH 28.4 26.0 - 34.0 pg   MCHC 32.8 30.0 - 36.0 g/dL   RDW 13.8 11.5 - 15.5 %   Platelets 162 150 - 400 K/uL   Neutrophils Relative % 74 %   Neutro Abs 5.2 1.7 - 7.7 K/uL   Lymphocytes Relative 15 %   Lymphs Abs 1.1 0.7 - 4.0 K/uL   Monocytes Relative 8 %   Monocytes Absolute 0.5 0.1 - 1.0 K/uL   Eosinophils Relative 3 %   Eosinophils Absolute 0.2 0.0 - 0.7 K/uL   Basophils Relative 0 %   Basophils Absolute 0.0 0.0 - 0.1 K/uL   No results found.  MDM  plasn : f/u dr Lovena Le tomorrow. Pt given po dose toplrol xl and eliquis prior to discharge Final diagnoses:  None  Dx : atrial fibrillation      Orlie Dakin, MD 05/06/15 2314

## 2015-05-06 NOTE — Progress Notes (Signed)
Patient Care Team: [name], MD as PCP - General (Endocrinology) Evans Lance, MD (Cardiology) Hillary Bow, MD (Cardiology) Bo Merino, MD as Consulting Physician (Rheumatology)   HPI  [name] is a 81 y.o. male Seen as an add-on today. He has complaints of recurrent atrial arrhythmia. He last saw Dr. Elliot Cousin 12/16 He has hx of CAD, s/p multiple interventions, PAF, and is s/p AVR for aortic stenosis with surgical MAZE.   The patient had maintained sinus rhythm on amiodarone therapy. He developed thyroid dysfunction and possible pulmonary toxicity and his amio has been stopped for over a year.   He has had PAF and symptomatic PVC's. He was seen by Dr. Greggory Brandy and thought not to have enough symptoms to undergo catheter ablation of atrial fib. He appears to be bothered most by his PVC's. His holter monitor demonstrated about 11k in 24 hours. Dronaderone was recommended but never initiated   he comies in today with complaints of palpitations over the last 3-4 days on and off Device nterrogation confirms PERSISENT tachycardia with variable conduction  There is also evidence of worsened bipolar atrial vs unipolar sensing  Past Medical History  Diagnosis Date  . 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  . Coronary artery disease     status post multiple prior percutaneous coronary interventions, microvascular angina per Dr Olevia Perches  . Aortic stenosis     moderate aortic stenosis  . Hyperlipidemia   . Hypercoagulable state ([place])     chronically anticoagulated with coumadin  . Heart murmur   . Stroke ([place])     1990  . Hyperthyroidism   . Benign prostatic hypertrophy   . Arthritis   . Paroxysmal atrial fibrillation ([place])     DR. Lia Foyer,   . Hypothyroidism     Dr. Elyse Hsu  . MGUS (monoclonal  gammopathy of unknown significance) 02/17/2013    Past Surgical History  Procedure Laterality Date  . Pacemaker insertion  1991    Guidant PPM, most recent Generator Change by Dr Olevia Perches was 08/22/06  . Hemrrhoidectomy    . Cardiac catheterization      11  . Cardioversion    . Appendectomy    . Aortic valve replacement  03/15/2011    Procedure: AORTIC VALVE REPLACEMENT (AVR);  Surgeon: Gaye Pollack, MD;  Location: [place];  Service: Open Heart Surgery;  Laterality: N/A;  . Maze  03/15/2011    Procedure: MAZE;  Surgeon: Gaye Pollack, MD;  Location: [place];  Service: Open Heart Surgery;  Laterality: N/A;  . Tee without cardioversion  04/15/2011    Procedure: TRANSESOPHAGEAL ECHOCARDIOGRAM (TEE);  Surgeon: Loralie Champagne, MD;  Location: [place];  Service: Cardiovascular;  Laterality: N/A;  . Cardioversion  04/15/2011    Procedure: CARDIOVERSION;  Surgeon: Loralie Champagne, MD;  Location: [facility] ENDOSCOPY;  Service: Cardiovascular;  Laterality: N/A;  . Left and right heart catheterization with coronary angiogram Bilateral 02/01/2011    Procedure: LEFT AND RIGHT HEART CATHETERIZATION WITH CORONARY ANGIOGRAM;  Surgeon: Hillary Bow, MD;  Location: [facility] CATH LAB;  Service: Cardiovascular;  Laterality: Bilateral;  . Tee without cardioversion N/A 09/11/2014    Procedure: TRANSESOPHAGEAL ECHOCARDIOGRAM (TEE);  Surgeon: Sueanne Margarita, MD;  Location: [place];  Service: Cardiovascular;  Laterality: N/A;  . Cardioversion N/A 09/11/2014    Procedure: CARDIOVERSION;  Surgeon: Sueanne Margarita, MD;  Location: [facility] ENDOSCOPY;  Service: Cardiovascular;  Laterality: N/A;    Current Outpatient Prescriptions  Medication Sig Dispense Refill  . apixaban (ELIQUIS) 5 MG TABS tablet Take 5 mg by mouth 2 (two) times daily.    . bisacodyl (DULCOLAX) 5 MG EC tablet Take 5 mg by mouth daily as needed (constipation).     . butalbital-acetaminophen-caffeine (FIORICET, ESGIC) 50-325-40 MG per tablet Take 1 tablet by mouth 2  (two) times daily.     . Coenzyme Q10 (COQ10) 100 MG CAPS Take 100 mg by mouth daily.    . Diclofenac Sodium 1.5 % SOLN Take 1 application by mouth daily as needed (pain).     Marland Kitchen doxazosin (CARDURA) 4 MG tablet Take 4 mg by mouth daily.    Marland Kitchen dronedarone (MULTAQ) 400 MG tablet Take 1 tablet (400 mg total) by mouth 2 (two) times daily with a meal. 180 tablet 3  . HYDROcodone-acetaminophen (NORCO) 10-325 MG per tablet Take 0.5-1 tablet by mouth twice a day    . LORazepam (ATIVAN) 1 MG tablet Take one tablet by mouth at bedtime. For anxiety    . metoprolol succinate (TOPROL-XL) 25 MG 24 hr tablet Take 1 tablet (25 mg total) by mouth 2 (two) times daily. 180 tablet 3  . nitroGLYCERIN (NITROSTAT) 0.4 MG SL tablet Place 1 tablet (0.4 mg total) under the tongue every 5 (five) minutes as needed for chest pain. 25 tablet 3  . potassium chloride SA (K-DUR,KLOR-CON) 20 MEQ tablet TAKE 1 TABLET BY MOUTH ONCE DAILY 90 tablet 1  . rosuvastatin (CRESTOR) 20 MG tablet Take 1 tablet (20 mg total) by mouth at bedtime. 90 tablet 3   No current facility-administered medications for this visit.    Allergies  Allergen Reactions  . Contrast Media [Iodinated Diagnostic Agents] Hives      Review of Systems negative except from HPI and PMH  Physical Exam BP 124/92 mmHg  Ht 6\' 1"  (1.854 m) Well developed and nourished in no acute distress HENT normal Neck supple with JVP-flat Clear Rapid and irreglarRegular rate and rhythm, no murmurs or gallops Abd-soft with active BS No Clubbing cyanosis edema Skin-warm and dry A & Oriented  Grossly normal sensory and motor function  ECG  Atrial tach/flutter with variable conduction  Assessment and  Plan  mulitple attempts to overdrive pace his atrial tach Has been on apixoban Have spoken with Dr Elliot Cousin and will arrange outpt DCCV in am

## 2015-05-06 NOTE — Discharge Instructions (Signed)
Atrial Fibrillation Call Dr. Lovena Le tomorrow morning to schedule the next available office appointment Atrial fibrillation is a type of heartbeat that is irregular or fast (rapid). If you have this condition, your heart keeps quivering in a weird (chaotic) way. This condition can make it so your heart cannot pump blood normally. Having this condition gives a person more risk for stroke, heart failure, and other heart problems. There are different types of atrial fibrillation. Talk with your doctor to learn about the type that you have. HOME CARE  Take over-the-counter and prescription medicines only as told by your doctor.  If your doctor prescribed a blood-thinning medicine, take it exactly as told. Taking too much of it can cause bleeding. If you do not take enough of it, you will not have the protection that you need against stroke and other problems.  Do not use any tobacco products. These include cigarettes, chewing tobacco, and e-cigarettes. If you need help quitting, ask your doctor.  If you have apnea (obstructive sleep apnea), manage it as told by your doctor.  Do not drink alcohol.  Do not drink beverages that have caffeine. These include coffee, soda, and tea.  Maintain a healthy weight. Do not use diet pills unless your doctor says they are safe for you. Diet pills may make heart problems worse.  Follow diet instructions as told by your doctor.  Exercise regularly as told by your doctor.  Keep all follow-up visits as told by your doctor. This is important. GET HELP IF:  You notice a change in the speed, rhythm, or strength of your heartbeat.  You are taking a blood-thinning medicine and you notice more bruising.  You get tired more easily when you move or exercise. GET HELP RIGHT AWAY IF:  You have pain in your chest or your belly (abdomen).  You have sweating or weakness.  You feel sick to your stomach (nauseous).  You notice blood in your throw up (vomit), poop  (stool), or pee (urine).  You are short of breath.  You suddenly have swollen feet and ankles.  You feel dizzy.  Your suddenly get weak or numb in your face, arms, or legs, especially if it happens on one side of your body.  You have trouble talking, trouble understanding, or both.  Your face or your eyelid droops on one side. These symptoms may be an emergency. Do not wait to see if the symptoms will go away. Get medical help right away. Call your local emergency services (911 in the [place] Do not drive yourself to the hospital.   This information is not intended to replace advice given to you by your health care provider. Make sure you discuss any questions you have with your health care provider.   Document Released: 11/25/2007 Document Revised: 11/06/2014 Document Reviewed: 06/12/2014 Elsevier Interactive Patient Education [employer].

## 2015-05-06 NOTE — Progress Notes (Addendum)
RT NOTE:  RT @ bedside to assist with cardioversion. AMBU bag on standby. Pt remained on 2L [place] throughout procedure. Sats 98%, ETCO2 32. Pt waking up at this time. Now on RA, Sats 98%.

## 2015-05-06 NOTE — ED Notes (Signed)
Pt presents to ER with [facility] for afib with RVR, palpitations, and weakness; pt reports recently seen with Dr Caryl Comes and had pacemaker changes; EMS reports occcassional PVCs; pt refused cardizem enroute;

## 2015-05-06 NOTE — Patient Instructions (Signed)
Medication Instructions: - no changes  Labwork: - none  Procedures/Testing: - none  Follow-Up: - call Heather, RN for Dr. Caryl Comes in the morning at [phone]  Any Additional Special Instructions Will Be Listed Below (If Applicable).     If you need a refill on your cardiac medications before your next appointment, please call your pharmacy.

## 2015-05-06 NOTE — Telephone Encounter (Addendum)
This note was written by Janan Halter, RN: Discussed with Dr Caryl Comes, he would like for patient to send transmission.  Spoke with patient and he does not have a way to send the transmission.  Dr Caryl Comes hs asked for him to come to the office at 5:00pm

## 2015-05-07 ENCOUNTER — Telehealth: Payer: Self-pay | Admitting: Internal Medicine

## 2015-05-07 NOTE — Telephone Encounter (Signed)
Calling stating he was seen in ER last PM with HR of 150 and CV in ER.  Was told by ER physician to get in touch with Dr. Lovena Le today to see "what next step would be".  States he is taking his Toprol and Eliquis.  Wants Dr. Lovena Le to call him at [phone]  [facility] on Dr. Tanna Furry phone to call Dr. Velora Heckler.

## 2015-05-07 NOTE — Telephone Encounter (Signed)
New Message:  Pt is returning [facility] call from yesterday.

## 2015-05-07 NOTE — ED Notes (Signed)
97.9mg  Propofol wasted in sharps container post patient discharge- unable to waste in medication pyxis

## 2015-05-09 ENCOUNTER — Telehealth: Payer: Self-pay | Admitting: Internal Medicine

## 2015-05-09 NOTE — Telephone Encounter (Addendum)
Spoke with patient and he was seen in ER after being here in the office 05/06/15 and had a DCCV.  He had labs done and wants the results.  I gave him the results and asked that I forward to Dr Beryle Beams.  I have done this but he is concerned as his hemoglobin has dropped even more.  I tried contacting the Internal medicine clinic at [facility] and held for over 5 minutes with no answer only automated saying to hold as all available  persons assisting other costumers.   Dr Lorin Picket contacted Dr Beryle Beams on his cell phone and feels better since speaking with him.  He is flying out tonight to be with his son who is 72 and just had recent spine surgery and is now in ICU.  He will call me back next week when he returns.

## 2015-05-09 NOTE — Telephone Encounter (Signed)
New Message:  Pt is calling to speak with Claiborne Billings about "something really important" . Please f/u with pt

## 2015-05-15 NOTE — Addendum Note (Signed)
Addended by: Freada Bergeron on: 05/15/2015 03:34 PM   Modules accepted: Orders

## 2015-05-16 MED FILL — LORazepam 1 MG TABS: 1 | 90 days supply | Qty: 180 | Fill #0

## 2015-05-19 MED FILL — DOXAZOSIN MESYLATE 4 MG TAB: 4 | 30 days supply | Qty: 30 | Fill #0

## 2015-05-19 MED FILL — ELIQUIS 5 MG TABLET: 5 | 30 days supply | Qty: 60 | Fill #6

## 2015-05-20 ENCOUNTER — Other Ambulatory Visit: Payer: Self-pay | Admitting: Cardiovascular Disease

## 2015-05-20 MED FILL — HYDROCODON-APAP 10-325: 10-325 | 50 days supply | Qty: 100 | Fill #0

## 2015-05-20 MED FILL — BUTALB-ACETAMIN-CAFF 50-325: 50-325-40 | 30 days supply | Qty: 60 | Fill #0

## 2015-05-30 ENCOUNTER — Telehealth: Payer: Self-pay | Admitting: Internal Medicine

## 2015-05-30 ENCOUNTER — Ambulatory Visit (INDEPENDENT_AMBULATORY_CARE_PROVIDER_SITE_OTHER): Payer: Medicare Other | Admitting: Internal Medicine

## 2015-05-30 ENCOUNTER — Encounter: Payer: Self-pay | Admitting: Internal Medicine

## 2015-05-30 VITALS — BP 108/60 | HR 66 | Ht 73.0 in | Wt 225.0 lb

## 2015-05-30 DIAGNOSIS — I480 Paroxysmal atrial fibrillation: Secondary | ICD-10-CM | POA: Diagnosis not present

## 2015-05-30 LAB — CUP PACEART INCLINIC DEVICE CHECK
Implantable Lead Implant Date: 19910315
Implantable Lead Implant Date: 19910315
Implantable Lead Location: 753859
Lead Channel Impedance Value: 520 Ohm
Lead Channel Pacing Threshold Amplitude: 1.5 V
Lead Channel Pacing Threshold Amplitude: 1.6 V
Lead Channel Pacing Threshold Amplitude: 1.9 V
Lead Channel Pacing Threshold Amplitude: 4 V
Lead Channel Pacing Threshold Pulse Width: 1 ms
Lead Channel Pacing Threshold Pulse Width: 1 ms
Lead Channel Pacing Threshold Pulse Width: 1 ms
Lead Channel Pacing Threshold Pulse Width: 1 ms
Lead Channel Sensing Intrinsic Amplitude: 1.5 mV
MDC IDC LEAD LOCATION: 753860
MDC IDC MSMT LEADCHNL RA PACING THRESHOLD AMPLITUDE: 3.3 V
MDC IDC MSMT LEADCHNL RA PACING THRESHOLD AMPLITUDE: 4.5 V
MDC IDC MSMT LEADCHNL RA PACING THRESHOLD PULSEWIDTH: 1 ms
MDC IDC MSMT LEADCHNL RA PACING THRESHOLD PULSEWIDTH: 1 ms
MDC IDC PG SERIAL: 567685
MDC IDC SESS DTM: 20170331040000
MDC IDC SET LEADCHNL RA PACING AMPLITUDE: 3.5 V
MDC IDC STAT BRADY RA PERCENT PACED: 75 %
MDC IDC STAT BRADY RV PERCENT PACED: 0 %

## 2015-05-30 MED ORDER — POTASSIUM CHLORIDE CRYS ER 20 MEQ PO TBCR: 20.0000 meq | EXTENDED_RELEASE_TABLET | Freq: Every day | ORAL | Status: DC

## 2015-05-30 MED ORDER — METOPROLOL SUCCINATE ER 25 MG PO TB24: 25.0000 mg | ORAL_TABLET | Freq: Two times a day (BID) | ORAL | Status: DC

## 2015-05-30 NOTE — Telephone Encounter (Signed)
Added patient on to schedule today at 10:30

## 2015-05-30 NOTE — Telephone Encounter (Signed)
Moved procedure to 4/3

## 2015-05-30 NOTE — Telephone Encounter (Signed)
New message     Patient calling did not disclose any information wants the nurse to call him back.

## 2015-05-30 NOTE — Telephone Encounter (Signed)
New Message:   Please call,said he was just there.

## 2015-05-30 NOTE — Progress Notes (Signed)
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 The patient has an extensive cardiac history with long-standing hypertension and sinus node dysfunction, status post pacemaker insertion, CAD, s/p multiple interventions, PAF, and is s/p AVR for aortic stenosis with surgical MAZE. The patient had maintained sinus rhythm on amiodarone therapy. He developed thyroid dysfunction and possible pulmonary toxicity and his amio has been stopped for over a year. He has had PAF and symptomatic PVC's. He was seen by Dr. Greggory Brandy and thought not to have enough symptoms to undergo catheter ablation of atrial fib. He appears to be bothered most by his PVC's.   His holter monitor demonstrated about 11k in 24 hours. He underwent DCCV several weeks ago for an atrial tachycardia but has been in and out of rhythm since. I recommended her start Multaq but he has only taken the medication a few times. He was out with MD friends yesterday and felt bad and was noted to have HR's in the 37's and returns today for PPM followup.  Allergies  Allergen Reactions  . Contrast Media [Iodinated Diagnostic Agents] Hives     Current Outpatient Prescriptions  Medication Sig Dispense Refill  . apixaban (ELIQUIS) 5 MG TABS tablet Take 5 mg by mouth 2 (two) times daily.    . bisacodyl (DULCOLAX) 5 MG EC tablet Take 5 mg by mouth daily as needed (constipation).     . butalbital-acetaminophen-caffeine (FIORICET, ESGIC) 50-325-40 MG per tablet Take 1 tablet by mouth 2 (two) times daily.     . Coenzyme Q10 (COQ10) 100 MG CAPS Take 100 mg by mouth daily.    . Diclofenac Sodium 1.5 % SOLN Take 1 application by mouth daily as needed (Knee pain).     Marland Kitchen doxazosin (CARDURA) 4 MG tablet Take 4 mg by mouth daily.    Marland Kitchen HYDROcodone-acetaminophen (NORCO) 10-325 MG tablet Take 1 tablet by mouth every 4 (four) hours as needed for moderate pain or severe pain.   0  . KLOR-CON M20 20 MEQ tablet TAKE 1 TABLET BY MOUTH ONCE DAILY 90 tablet 3  .  LORazepam (ATIVAN) 1 MG tablet Take one tablet by mouth at bedtime. For anxiety    . MAGNESIUM PO Take 1 tablet by mouth daily.    . metoprolol succinate (TOPROL-XL) 25 MG 24 hr tablet Take 1 tablet (25 mg total) by mouth 2 (two) times daily. 180 tablet 3  . Multiple Vitamin (MULTIVITAMIN WITH MINERALS) TABS tablet Take 1 tablet by mouth daily.    . nitroGLYCERIN (NITROSTAT) 0.4 MG SL tablet Place 1 tablet (0.4 mg total) under the tongue every 5 (five) minutes as needed for chest pain. 25 tablet 3  . rosuvastatin (CRESTOR) 20 MG tablet Take 1 tablet (20 mg total) by mouth at bedtime. 90 tablet 3   No current facility-administered medications for this visit.     Past Medical History  Diagnosis Date  . 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  . Coronary artery disease     status post multiple prior percutaneous coronary interventions, microvascular angina per Dr Olevia Perches  . Aortic stenosis     moderate aortic stenosis  . Hyperlipidemia   . Hypercoagulable state ([place])     chronically anticoagulated with coumadin  . Heart murmur   . Stroke ([place])     1990  . Hyperthyroidism   .  Benign prostatic hypertrophy   . Arthritis   . Paroxysmal atrial fibrillation ([place])     DR. Lia Foyer,   . Hypothyroidism     Dr. Elyse Hsu  . MGUS (monoclonal gammopathy of unknown significance) 02/17/2013    ROS:   All systems reviewed and negative except as noted in the HPI.   Past Surgical History  Procedure Laterality Date  . Pacemaker insertion  1991    Guidant PPM, most recent Generator Change by Dr Olevia Perches was 08/22/06  . Hemrrhoidectomy    . Cardiac catheterization      11  . Cardioversion    . Appendectomy    . Aortic valve replacement  03/15/2011    Procedure: AORTIC VALVE REPLACEMENT (AVR);  Surgeon: Gaye Pollack, MD;  Location: [place];   Service: Open Heart Surgery;  Laterality: N/A;  . Maze  03/15/2011    Procedure: MAZE;  Surgeon: Gaye Pollack, MD;  Location: [place];  Service: Open Heart Surgery;  Laterality: N/A;  . Tee without cardioversion  04/15/2011    Procedure: TRANSESOPHAGEAL ECHOCARDIOGRAM (TEE);  Surgeon: Loralie Champagne, MD;  Location: [place];  Service: Cardiovascular;  Laterality: N/A;  . Cardioversion  04/15/2011    Procedure: CARDIOVERSION;  Surgeon: Loralie Champagne, MD;  Location: [facility] ENDOSCOPY;  Service: Cardiovascular;  Laterality: N/A;  . Left and right heart catheterization with coronary angiogram Bilateral 02/01/2011    Procedure: LEFT AND RIGHT HEART CATHETERIZATION WITH CORONARY ANGIOGRAM;  Surgeon: Hillary Bow, MD;  Location: [facility] CATH LAB;  Service: Cardiovascular;  Laterality: Bilateral;  . Tee without cardioversion N/A 09/11/2014    Procedure: TRANSESOPHAGEAL ECHOCARDIOGRAM (TEE);  Surgeon: Sueanne Margarita, MD;  Location: [facility] ENDOSCOPY;  Service: Cardiovascular;  Laterality: N/A;  . Cardioversion N/A 09/11/2014    Procedure: CARDIOVERSION;  Surgeon: Sueanne Margarita, MD;  Location: [facility] ENDOSCOPY;  Service: Cardiovascular;  Laterality: N/A;     Family History  Problem Relation Age of Onset  . Heart disease Brother     Twin brother has coronary disease and recent AVR for AS  . Anesthesia problems Neg Hx   . Hypotension Neg Hx   . Malignant hyperthermia Neg Hx   . Pseudochol deficiency Neg Hx      Social History   Social History  . Marital Status: Married    Spouse Name: N/A  . Number of Children: N/A  . Years of Education: N/A   Occupational History  . Not on file.   Social History Main Topics  . Smoking status: Never Smoker   . Smokeless tobacco: Not on file  . Alcohol Use: 0.0 oz/week    0 Standard drinks or equivalent per week     Comment: Rarely.  . Drug Use: No  . Sexual Activity: Not on file   Other Topics Concern  . Not on file   Social History Narrative     BP 108/60  mmHg  Pulse 66  Ht 6\' 1"  (1.854 m)  Wt 225 lb (102.059 kg)  BMI 29.69 kg/m2  Physical Exam:  Well appearing 81 year old man, NAD HEENT: Unremarkable Neck:  6 cm JVD, no thyromegally Back:  No CVA tenderness Lungs:  Clear with no wheezes, rales, or rhonchi. HEART:  Regular brady rhythm, split S2. Abd:  soft, positive bowel sounds, no organomegally, no rebound, no guarding Ext:  2 plus pulses, no edema, no cyanosis, no clubbing Skin:  No rashes no nodules Neuro:  CN II through XII intact, motor grossly intact   DEVICE  Normal device function.  See PaceArt for details. He has had some oversensing of atrial lead noise.   Assess/Plan:  1. Atrial lead oversensing - I suspect he is now symptomatic. We discussed the treatment options. His leads are 79 years old. I have recommended he undergo placement of a new pacing system in the next few weeks with plans to abandon his old system. 2. Atrial fib/tachycardia - I have suggested he try Multaq for 2 weeks. He has had some nausea with the medication.  3. PPM - he is about a year from ERI. Will plan to place a new device in a few weeks to avoid atrial oversensing of electical noise 4. CAD - he denies anginal symptoms. Will follow.  Mikle Bosworth.D.

## 2015-05-30 NOTE — Patient Instructions (Addendum)
Medication Instructions:  Your physician recommends that you continue on your current medications as directed. Please refer to the Current Medication list given to you today.   Labwork: At the hospital day of procedure  Testing/Procedures: Your physician has recommended that you have a pacemaker inserted. A pacemaker is a small device that is placed under the skin of your chest or abdomen to help control abnormal heart rhythms. This device uses electrical pulses to prompt the heart to beat at a normal rate. Pacemakers are used to treat heart rhythms that are too slow. Wire (leads) are attached to the pacemaker that goes into the chambers of you heart. This is done in the hospital and usually requires and overnight stay. Please see the instruction sheet given to you today for more information.  Please arrive at [place] [facility] at 7:30am  Do not eat or drink after midnight    Follow-Up: Your physician recommends that you schedule a follow-up appointment in: 10-14 days from 06/02/15 in device clinic for wound check    Any Other Special Instructions Will Be Listed Below (If Applicable).     If you need a refill on your cardiac medications before your next appointment, please call your pharmacy.

## 2015-06-01 MED ORDER — SODIUM CHLORIDE 0.9 % IR SOLN
80.0000 mg | Status: DC
  Filled 2015-06-01: qty 2

## 2015-06-01 MED ORDER — CEFAZOLIN SODIUM-DEXTROSE 2-4 GM/100ML-% IV SOLN
2.0000 g | INTRAVENOUS | Status: DC
  Filled 2015-06-01: qty 100

## 2015-06-01 MED ORDER — SODIUM CHLORIDE 0.9 % IV SOLN
INTRAVENOUS | Status: DC
Start: 2015-06-01 — End: 2015-06-02

## 2015-06-01 MED ORDER — CHLORHEXIDINE GLUCONATE 4 % EX LIQD
60.0000 mL | Freq: Once | CUTANEOUS | Status: DC
  Filled 2015-06-01: qty 60

## 2015-06-02 ENCOUNTER — Ambulatory Visit (HOSPITAL_COMMUNITY): Admission: RE | Admit: 2015-06-02 | Payer: Medicare Other | Source: Ambulatory Visit | Admitting: Internal Medicine

## 2015-06-02 ENCOUNTER — Encounter (HOSPITAL_COMMUNITY): Admission: RE | Payer: Self-pay | Source: Ambulatory Visit

## 2015-06-02 SURGERY — PACEMAKER IMPLANT
Anesthesia: LOCAL

## 2015-06-03 MED FILL — predniSONE 5 MG TABS: 5 | 30 days supply | Qty: 100 | Fill #0

## 2015-06-03 MED FILL — KLOR-CON M20 TABLET: 20 | 90 days supply | Qty: 90 | Fill #0

## 2015-06-09 ENCOUNTER — Telehealth: Payer: Self-pay | Admitting: Internal Medicine

## 2015-06-09 NOTE — Telephone Encounter (Signed)
Spoke with patient and moved his case again.  This time to 06/19/15

## 2015-06-09 NOTE — Telephone Encounter (Signed)
New Message:  Pt called in wanting to speak with Claiborne Billings about cancelling his procedure. Please f/u with him

## 2015-06-11 ENCOUNTER — Telehealth: Payer: Self-pay | Admitting: Internal Medicine

## 2015-06-11 ENCOUNTER — Ambulatory Visit (INDEPENDENT_AMBULATORY_CARE_PROVIDER_SITE_OTHER)
Admission: RE | Admit: 2015-06-11 | Discharge: 2015-06-11 | Disposition: A | Discharge status: Home / Self Care | Payer: Medicare Other | Source: Ambulatory Visit | Attending: Pulmonary Disease | Admitting: Pulmonary Disease

## 2015-06-11 ENCOUNTER — Telehealth: Payer: Self-pay | Admitting: Pulmonary Disease

## 2015-06-11 DIAGNOSIS — R05 Cough: Secondary | ICD-10-CM | POA: Diagnosis not present

## 2015-06-11 DIAGNOSIS — J209 Acute bronchitis, unspecified: Secondary | ICD-10-CM | POA: Diagnosis not present

## 2015-06-11 NOTE — Telephone Encounter (Signed)
Says got CXR done and looks ok.

## 2015-06-11 NOTE — Telephone Encounter (Signed)
I called [name] w/ report & sent to him via Email... Copies to DrAltheimer & DrTaylor... [facility]

## 2015-06-11 NOTE — Telephone Encounter (Signed)
New Message: ° ° ° ° °Please call. °

## 2015-06-11 NOTE — Telephone Encounter (Signed)
Follow Up:   He wants you to call him again.

## 2015-06-11 NOTE — Telephone Encounter (Signed)
Still coughing and wants to get a CXR at [facility].  I let him know unfortunately we can not order a CXR there.  I can order one at [place].  He will call Dr Altheimer's office and have them order.

## 2015-06-11 NOTE — Telephone Encounter (Signed)
Pt downstairs for cxr for bronchitis symptoms Order placed Will route phone note to SN/Elise for follow up

## 2015-06-13 NOTE — Telephone Encounter (Signed)
Patient is feeling much better and will move his case to [facility].  Be at the hospital at 11am per patient request for 1pm procedure.  Aaron Edelman moved at the lab.

## 2015-06-16 ENCOUNTER — Ambulatory Visit (HOSPITAL_COMMUNITY)
Admission: RE | Admit: 2015-06-16 | Discharge: 2015-06-17 | Disposition: A | Discharge status: Home / Self Care | Payer: Medicare Other | Source: Ambulatory Visit | Attending: Internal Medicine | Admitting: Internal Medicine

## 2015-06-16 ENCOUNTER — Ambulatory Visit: Payer: Medicare Other

## 2015-06-16 ENCOUNTER — Encounter (HOSPITAL_COMMUNITY)
Admission: RE | Disposition: A | Discharge status: Home / Self Care | Payer: Self-pay | Source: Ambulatory Visit | Attending: Internal Medicine

## 2015-06-16 ENCOUNTER — Encounter (HOSPITAL_COMMUNITY): Payer: Self-pay

## 2015-06-16 DIAGNOSIS — I350 Nonrheumatic aortic (valve) stenosis: Secondary | ICD-10-CM | POA: Diagnosis not present

## 2015-06-16 DIAGNOSIS — I481 Persistent atrial fibrillation: Secondary | ICD-10-CM | POA: Insufficient documentation

## 2015-06-16 DIAGNOSIS — E785 Hyperlipidemia, unspecified: Secondary | ICD-10-CM | POA: Insufficient documentation

## 2015-06-16 DIAGNOSIS — E039 Hypothyroidism, unspecified: Secondary | ICD-10-CM | POA: Insufficient documentation

## 2015-06-16 DIAGNOSIS — Z8673 Personal history of transient ischemic attack (TIA), and cerebral infarction without residual deficits: Secondary | ICD-10-CM | POA: Diagnosis not present

## 2015-06-16 DIAGNOSIS — Z7901 Long term (current) use of anticoagulants: Secondary | ICD-10-CM | POA: Insufficient documentation

## 2015-06-16 DIAGNOSIS — Z79899 Other long term (current) drug therapy: Secondary | ICD-10-CM | POA: Insufficient documentation

## 2015-06-16 DIAGNOSIS — I2510 Atherosclerotic heart disease of native coronary artery without angina pectoris: Secondary | ICD-10-CM | POA: Diagnosis not present

## 2015-06-16 DIAGNOSIS — Z952 Presence of prosthetic heart valve: Secondary | ICD-10-CM | POA: Diagnosis not present

## 2015-06-16 DIAGNOSIS — I495 Sick sinus syndrome: Secondary | ICD-10-CM | POA: Insufficient documentation

## 2015-06-16 DIAGNOSIS — Z950 Presence of cardiac pacemaker: Secondary | ICD-10-CM

## 2015-06-16 DIAGNOSIS — D472 Monoclonal gammopathy: Secondary | ICD-10-CM | POA: Diagnosis not present

## 2015-06-16 HISTORY — PX: EP IMPLANTABLE DEVICE: SHX172B

## 2015-06-16 LAB — BASIC METABOLIC PANEL
Anion gap: 11 (ref 5–15)
BUN: 13 mg/dL (ref 6–20)
CHLORIDE: 104 mmol/L (ref 101–111)
CO2: 25 mmol/L (ref 22–32)
CREATININE: 0.96 mg/dL (ref 0.61–1.24)
Calcium: 9 mg/dL (ref 8.9–10.3)
GFR calc Af Amer: >60 mL/min (ref >60)
GFR calc non Af Amer: >60 mL/min (ref >60)
GLUCOSE: 128 mg/dL — AB (ref 65–99)
Potassium: 3.9 mmol/L (ref 3.5–5.1)
SODIUM: 140 mmol/L (ref 135–145)

## 2015-06-16 LAB — SURGICAL PCR SCREEN
MRSA, PCR: NEGATIVE
Staphylococcus aureus: NEGATIVE

## 2015-06-16 LAB — CBC
HCT: 39.1 % (ref 39.0–52.0)
HEMOGLOBIN: 12.6 g/dL — AB (ref 13.0–17.0)
MCH: 28.2 pg (ref 26.0–34.0)
MCHC: 32.2 g/dL (ref 30.0–36.0)
MCV: 87.5 fL (ref 78.0–100.0)
PLATELETS: 196 10*3/uL (ref 150–400)
RBC: 4.47 MIL/uL (ref 4.22–5.81)
RDW: 14.3 % (ref 11.5–15.5)
WBC: 7.9 10*3/uL (ref 4.0–10.5)

## 2015-06-16 SURGERY — PACEMAKER IMPLANT

## 2015-06-16 MED ORDER — FENTANYL CITRATE (PF) 100 MCG/2ML IJ SOLN
INTRAMUSCULAR | Status: DC | PRN
  Administered 2015-06-16: 25 ug via INTRAVENOUS
  Administered 2015-06-16 (×3): 12.5 ug via INTRAVENOUS

## 2015-06-16 MED ORDER — ACETAMINOPHEN 325 MG PO TABS: 325.0000 mg | ORAL_TABLET | ORAL | Status: DC | PRN

## 2015-06-16 MED ORDER — BUTALBITAL-APAP-CAFFEINE 50-325-40 MG PO TABS: 1.0000 | ORAL_TABLET | Freq: Two times a day (BID) | ORAL | Status: DC | PRN

## 2015-06-16 MED ORDER — GENTAMICIN SULFATE 40 MG/ML IJ SOLN
80.0000 mg | INTRAMUSCULAR | Status: AC
  Administered 2015-06-16: 80 mg

## 2015-06-16 MED ORDER — SODIUM CHLORIDE 0.9 % IV SOLN
INTRAVENOUS | Status: DC
  Administered 2015-06-16: 12:00:00 via INTRAVENOUS

## 2015-06-16 MED ORDER — DOXAZOSIN MESYLATE 4 MG PO TABS
4.0000 mg | ORAL_TABLET | Freq: Every day | ORAL | Status: DC
  Administered 2015-06-16: 4 mg via ORAL
  Filled 2015-06-16: qty 1

## 2015-06-16 MED ORDER — FENTANYL CITRATE (PF) 100 MCG/2ML IJ SOLN
INTRAMUSCULAR | Status: AC
  Filled 2015-06-16: qty 2

## 2015-06-16 MED ORDER — LORAZEPAM 0.5 MG PO TABS
1.0000 mg | ORAL_TABLET | Freq: Every day | ORAL | Status: DC
  Administered 2015-06-16: 22:00:00 1 mg via ORAL
  Filled 2015-06-16: qty 2

## 2015-06-16 MED ORDER — CEFAZOLIN SODIUM-DEXTROSE 2-4 GM/100ML-% IV SOLN
2.0000 g | INTRAVENOUS | Status: AC
  Administered 2015-06-16: 2 g via INTRAVENOUS

## 2015-06-16 MED ORDER — MIDAZOLAM HCL 5 MG/5ML IJ SOLN
INTRAMUSCULAR | Status: DC | PRN
  Administered 2015-06-16: 1 mg via INTRAVENOUS
  Administered 2015-06-16: 2 mg via INTRAVENOUS
  Administered 2015-06-16 (×2): 1 mg via INTRAVENOUS

## 2015-06-16 MED ORDER — MIDAZOLAM HCL 5 MG/5ML IJ SOLN
INTRAMUSCULAR | Status: AC
  Filled 2015-06-16: qty 5

## 2015-06-16 MED ORDER — CEFAZOLIN SODIUM 1-5 GM-% IV SOLN
1.0000 g | Freq: Four times a day (QID) | INTRAVENOUS | Status: AC
  Administered 2015-06-16 – 2015-06-17 (×3): 1 g via INTRAVENOUS
  Filled 2015-06-16 (×3): qty 50

## 2015-06-16 MED ORDER — SODIUM CHLORIDE 0.9 % IR SOLN
Status: AC
  Filled 2015-06-16: qty 2

## 2015-06-16 MED ORDER — HEPARIN (PORCINE) IN NACL 2-0.9 UNIT/ML-% IJ SOLN
INTRAMUSCULAR | Status: DC | PRN
  Administered 2015-06-16: 500 mL

## 2015-06-16 MED ORDER — APIXABAN 5 MG PO TABS
5.0000 mg | ORAL_TABLET | Freq: Two times a day (BID) | ORAL | Status: DC
  Administered 2015-06-16 – 2015-06-17 (×2): 5 mg via ORAL
  Filled 2015-06-16 (×2): qty 1

## 2015-06-16 MED ORDER — BISACODYL 5 MG PO TBEC: 5.0000 mg | DELAYED_RELEASE_TABLET | Freq: Every day | ORAL | Status: DC | PRN

## 2015-06-16 MED ORDER — ROSUVASTATIN CALCIUM 20 MG PO TABS
20.0000 mg | ORAL_TABLET | Freq: Every day | ORAL | Status: DC
  Administered 2015-06-16: 20 mg via ORAL
  Filled 2015-06-16: qty 1

## 2015-06-16 MED ORDER — CEFAZOLIN SODIUM-DEXTROSE 2-3 GM-% IV SOLR
INTRAVENOUS | Status: AC
Start: 2015-06-16 — End: 2015-06-16
  Filled 2015-06-16: qty 50

## 2015-06-16 MED ORDER — ADULT MULTIVITAMIN W/MINERALS CH
1.0000 | ORAL_TABLET | Freq: Every day | ORAL | Status: DC
  Administered 2015-06-17: 08:00:00 1 via ORAL
  Filled 2015-06-16 (×2): qty 1

## 2015-06-16 MED ORDER — NITROGLYCERIN 0.4 MG SL SUBL
0.4000 mg | SUBLINGUAL_TABLET | SUBLINGUAL | Status: DC | PRN
Start: 2015-06-16 — End: 2015-06-17

## 2015-06-16 MED ORDER — LIDOCAINE HCL (PF) 1 % IJ SOLN
INTRAMUSCULAR | Status: AC
Start: 2015-06-16 — End: 2015-06-16
  Filled 2015-06-16: qty 60

## 2015-06-16 MED ORDER — LIDOCAINE HCL (PF) 1 % IJ SOLN
INTRAMUSCULAR | Status: DC | PRN
  Administered 2015-06-16: 50 mL

## 2015-06-16 MED ORDER — MUPIROCIN 2 % EX OINT
1.0000 "application " | TOPICAL_OINTMENT | Freq: Once | CUTANEOUS | Status: AC
  Administered 2015-06-16: 1 via TOPICAL

## 2015-06-16 MED ORDER — PREDNISONE 20 MG PO TABS
20.0000 mg | ORAL_TABLET | Freq: Every day | ORAL | Status: DC
  Administered 2015-06-17: 20 mg via ORAL
  Filled 2015-06-16: qty 1

## 2015-06-16 MED ORDER — ONDANSETRON HCL 4 MG/2ML IJ SOLN: 4.0000 mg | Freq: Four times a day (QID) | INTRAMUSCULAR | Status: DC | PRN

## 2015-06-16 MED ORDER — HYDROCODONE-ACETAMINOPHEN 10-325 MG PO TABS
1.0000 | ORAL_TABLET | ORAL | Status: DC | PRN
  Administered 2015-06-16 – 2015-06-17 (×3): 1 via ORAL
  Filled 2015-06-16 (×3): qty 1

## 2015-06-16 MED ORDER — METOPROLOL SUCCINATE ER 25 MG PO TB24
25.0000 mg | ORAL_TABLET | Freq: Two times a day (BID) | ORAL | Status: DC
  Administered 2015-06-16 – 2015-06-17 (×2): 25 mg via ORAL
  Filled 2015-06-16 (×2): qty 1

## 2015-06-16 MED ORDER — COQ10 100 MG PO CAPS: 100.0000 mg | ORAL_CAPSULE | Freq: Every day | ORAL | Status: DC

## 2015-06-16 MED ORDER — POTASSIUM CHLORIDE CRYS ER 20 MEQ PO TBCR
20.0000 meq | EXTENDED_RELEASE_TABLET | Freq: Every day | ORAL | Status: DC
  Administered 2015-06-16 – 2015-06-17 (×2): 20 meq via ORAL
  Filled 2015-06-16 (×2): qty 1

## 2015-06-16 MED ORDER — MUPIROCIN 2 % EX OINT
TOPICAL_OINTMENT | CUTANEOUS | Status: AC
  Administered 2015-06-16: 1 via TOPICAL
  Filled 2015-06-16: qty 22

## 2015-06-16 SURGICAL SUPPLY — 10 items
CABLE SURGICAL S-101-97-12 (CABLE) ×1 IMPLANT
INGEVITY MRI 7740-45CM (Lead) ×2 IMPLANT
INGEVITY MRI 7741-52CM (Lead) ×2 IMPLANT
LEAD PACING INGEVITY MRI 45CM (Lead) IMPLANT
LEAD PACING INGEVITY MRI 52CM (Lead) IMPLANT
PAD DEFIB LIFELINK (PAD) ×1 IMPLANT
SHEATH CLASSIC 7F (SHEATH) ×2 IMPLANT
SYS PACING ACCOLADE EL DR L321 (Pacemaker) ×2 IMPLANT
SYSTEM PACING ACCLD EL DR L321 (Pacemaker) IMPLANT
TRAY PACEMAKER INSERTION (PACKS) ×1 IMPLANT

## 2015-06-16 NOTE — Interval H&P Note (Signed)
History and Physical Interval Note:  06/16/2015 1:36 PM  [name]  has presented today for surgery, with the diagnosis of afib  The various methods of treatment have been discussed with the patient and family. After consideration of risks, benefits and other options for treatment, the patient has consented to  Procedure(s): Pacemaker Implant (N/A) as a surgical intervention .  The patient's history has been reviewed, patient examined, no change in status, stable for surgery.  I have reviewed the patient's chart and labs.  Questions were answered to the patient's satisfaction.     Cristopher Peru

## 2015-06-16 NOTE — H&P (View-Only) (Signed)
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 The patient has an extensive cardiac history with long-standing hypertension and sinus node dysfunction, status post pacemaker insertion, CAD, s/p multiple interventions, PAF, and is s/p AVR for aortic stenosis with surgical MAZE. The patient had maintained sinus rhythm on amiodarone therapy. He developed thyroid dysfunction and possible pulmonary toxicity and his amio has been stopped for over a year. He has had PAF and symptomatic PVC's. He was seen by Dr. Greggory Brandy and thought not to have enough symptoms to undergo catheter ablation of atrial fib. He appears to be bothered most by his PVC's.   His holter monitor demonstrated about 11k in 24 hours. He underwent DCCV several weeks ago for an atrial tachycardia but has been in and out of rhythm since. I recommended her start Multaq but he has only taken the medication a few times. He was out with MD friends yesterday and felt bad and was noted to have HR's in the 76's and returns today for PPM followup.  Allergies  Allergen Reactions  . Contrast Media [Iodinated Diagnostic Agents] Hives     Current Outpatient Prescriptions  Medication Sig Dispense Refill  . apixaban (ELIQUIS) 5 MG TABS tablet Take 5 mg by mouth 2 (two) times daily.    . bisacodyl (DULCOLAX) 5 MG EC tablet Take 5 mg by mouth daily as needed (constipation).     . butalbital-acetaminophen-caffeine (FIORICET, ESGIC) 50-325-40 MG per tablet Take 1 tablet by mouth 2 (two) times daily.     . Coenzyme Q10 (COQ10) 100 MG CAPS Take 100 mg by mouth daily.    . Diclofenac Sodium 1.5 % SOLN Take 1 application by mouth daily as needed (Knee pain).     Marland Kitchen doxazosin (CARDURA) 4 MG tablet Take 4 mg by mouth daily.    Marland Kitchen HYDROcodone-acetaminophen (NORCO) 10-325 MG tablet Take 1 tablet by mouth every 4 (four) hours as needed for moderate pain or severe pain.   0  . KLOR-CON M20 20 MEQ tablet TAKE 1 TABLET BY MOUTH ONCE DAILY 90 tablet 3  .  LORazepam (ATIVAN) 1 MG tablet Take one tablet by mouth at bedtime. For anxiety    . MAGNESIUM PO Take 1 tablet by mouth daily.    . metoprolol succinate (TOPROL-XL) 25 MG 24 hr tablet Take 1 tablet (25 mg total) by mouth 2 (two) times daily. 180 tablet 3  . Multiple Vitamin (MULTIVITAMIN WITH MINERALS) TABS tablet Take 1 tablet by mouth daily.    . nitroGLYCERIN (NITROSTAT) 0.4 MG SL tablet Place 1 tablet (0.4 mg total) under the tongue every 5 (five) minutes as needed for chest pain. 25 tablet 3  . rosuvastatin (CRESTOR) 20 MG tablet Take 1 tablet (20 mg total) by mouth at bedtime. 90 tablet 3   No current facility-administered medications for this visit.     Past Medical History  Diagnosis Date  . 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  . Coronary artery disease     status post multiple prior percutaneous coronary interventions, microvascular angina per Dr Olevia Perches  . Aortic stenosis     moderate aortic stenosis  . Hyperlipidemia   . Hypercoagulable state ([place])     chronically anticoagulated with coumadin  . Heart murmur   . Stroke ([place])     1990  . Hyperthyroidism   .  Benign prostatic hypertrophy   . Arthritis   . Paroxysmal atrial fibrillation ([place])     DR. Lia Foyer,   . Hypothyroidism     Dr. Elyse Hsu  . MGUS (monoclonal gammopathy of unknown significance) 02/17/2013    ROS:   All systems reviewed and negative except as noted in the HPI.   Past Surgical History  Procedure Laterality Date  . Pacemaker insertion  1991    Guidant PPM, most recent Generator Change by Dr Olevia Perches was 08/22/06  . Hemrrhoidectomy    . Cardiac catheterization      11  . Cardioversion    . Appendectomy    . Aortic valve replacement  03/15/2011    Procedure: AORTIC VALVE REPLACEMENT (AVR);  Surgeon: Gaye Pollack, MD;  Location: [place];   Service: Open Heart Surgery;  Laterality: N/A;  . Maze  03/15/2011    Procedure: MAZE;  Surgeon: Gaye Pollack, MD;  Location: [place];  Service: Open Heart Surgery;  Laterality: N/A;  . Tee without cardioversion  04/15/2011    Procedure: TRANSESOPHAGEAL ECHOCARDIOGRAM (TEE);  Surgeon: Loralie Champagne, MD;  Location: [place];  Service: Cardiovascular;  Laterality: N/A;  . Cardioversion  04/15/2011    Procedure: CARDIOVERSION;  Surgeon: Loralie Champagne, MD;  Location: [facility] ENDOSCOPY;  Service: Cardiovascular;  Laterality: N/A;  . Left and right heart catheterization with coronary angiogram Bilateral 02/01/2011    Procedure: LEFT AND RIGHT HEART CATHETERIZATION WITH CORONARY ANGIOGRAM;  Surgeon: Hillary Bow, MD;  Location: [facility] CATH LAB;  Service: Cardiovascular;  Laterality: Bilateral;  . Tee without cardioversion N/A 09/11/2014    Procedure: TRANSESOPHAGEAL ECHOCARDIOGRAM (TEE);  Surgeon: Sueanne Margarita, MD;  Location[facility] ENDOSCOPY;  Service: Cardiovascular;  Laterality: N/A;  . Cardioversion N/A 09/11/2014    Procedure: CARDIOVERSION;  Surgeon: Sueanne Margarita, MD;  Location: [facility] ENDOSCOPY;  Service: Cardiovascular;  Laterality: N/A;     Family History  Problem Relation Age of Onset  . Heart disease Brother     Twin brother has coronary disease and recent AVR for AS  . Anesthesia problems Neg Hx   . Hypotension Neg Hx   . Malignant hyperthermia Neg Hx   . Pseudochol deficiency Neg Hx      Social History   Social History  . Marital Status: Married    Spouse Name: N/A  . Number of Children: N/A  . Years of Education: N/A   Occupational History  . Not on file.   Social History Main Topics  . Smoking status: Never Smoker   . Smokeless tobacco: Not on file  . Alcohol Use: 0.0 oz/week    0 Standard drinks or equivalent per week     Comment: Rarely.  . Drug Use: No  . Sexual Activity: Not on file   Other Topics Concern  . Not on file   Social History Narrative     BP 108/60  mmHg  Pulse 66  Ht 6\' 1"  (1.854 m)  Wt 225 lb (102.059 kg)  BMI 29.69 kg/m2  Physical Exam:  Well appearing 81 year old man, NAD HEENT: Unremarkable Neck:  6 cm JVD, no thyromegally Back:  No CVA tenderness Lungs:  Clear with no wheezes, rales, or rhonchi. HEART:  Regular brady rhythm, split S2. Abd:  soft, positive bowel sounds, no organomegally, no rebound, no guarding Ext:  2 plus pulses, no edema, no cyanosis, no clubbing Skin:  No rashes no nodules Neuro:  CN II through XII intact, motor grossly intact   DEVICE  Normal device function.  See PaceArt for details. He has had some oversensing of atrial lead noise.   Assess/Plan:  1. Atrial lead oversensing - I suspect he is now symptomatic. We discussed the treatment options. His leads are 79 years old. I have recommended he undergo placement of a new pacing system in the next few weeks with plans to abandon his old system. 2. Atrial fib/tachycardia - I have suggested he try Multaq for 2 weeks. He has had some nausea with the medication.  3. PPM - he is about a year from ERI. Will plan to place a new device in a few weeks to avoid atrial oversensing of electical noise 4. CAD - he denies anginal symptoms. Will follow.  Mikle Bosworth.D.

## 2015-06-17 ENCOUNTER — Ambulatory Visit (HOSPITAL_COMMUNITY): Payer: Medicare Other

## 2015-06-17 ENCOUNTER — Encounter (HOSPITAL_COMMUNITY): Payer: Self-pay | Admitting: Internal Medicine

## 2015-06-17 DIAGNOSIS — I499 Cardiac arrhythmia, unspecified: Secondary | ICD-10-CM | POA: Diagnosis not present

## 2015-06-17 DIAGNOSIS — E039 Hypothyroidism, unspecified: Secondary | ICD-10-CM | POA: Diagnosis not present

## 2015-06-17 DIAGNOSIS — Z8673 Personal history of transient ischemic attack (TIA), and cerebral infarction without residual deficits: Secondary | ICD-10-CM | POA: Diagnosis not present

## 2015-06-17 DIAGNOSIS — Z950 Presence of cardiac pacemaker: Secondary | ICD-10-CM | POA: Diagnosis not present

## 2015-06-17 DIAGNOSIS — I2510 Atherosclerotic heart disease of native coronary artery without angina pectoris: Secondary | ICD-10-CM | POA: Diagnosis not present

## 2015-06-17 DIAGNOSIS — I495 Sick sinus syndrome: Secondary | ICD-10-CM | POA: Diagnosis not present

## 2015-06-17 DIAGNOSIS — I481 Persistent atrial fibrillation: Secondary | ICD-10-CM | POA: Diagnosis not present

## 2015-06-17 DIAGNOSIS — E785 Hyperlipidemia, unspecified: Secondary | ICD-10-CM | POA: Diagnosis not present

## 2015-06-17 MED ORDER — YOU HAVE A PACEMAKER BOOK
Freq: Once | Status: AC
  Administered 2015-06-17: 02:00:00
  Filled 2015-06-17: qty 1

## 2015-06-17 MED FILL — Gentamicin Sulfate Inj 40 MG/ML: INTRAMUSCULAR | Qty: 2 | Status: AC

## 2015-06-17 MED FILL — Sodium Chloride Irrigation Soln 0.9%: Qty: 500 | Status: AC

## 2015-06-17 NOTE — Discharge Summary (Signed)
ELECTROPHYSIOLOGY PROCEDURE DISCHARGE SUMMARY    Patient ID: [name],  MRN: [id number], DOB/AGE: 04-16-1934 81 y.o.  Admit date: 06/16/2015 Discharge date: 06/17/2015  Primary Care Physician: Limmie Patricia, MD Primary Cardiologist: previously [place] Electrophysiologist: Lovena Le  Primary Discharge Diagnosis:  Symptomatic sinus node dysfunction with atrial lead malfunction status post pacemaker implantation this admission  Secondary Discharge Diagnosis:  1.  CAD 2.  Aortic stenosis s/p AVR 3.  Hyperlipidemia 4.  Prior CVA 5.  Hypothyroidism 6.  MGUS 7.  Persistent atrial fibrillation  Allergies  Allergen Reactions  . Contrast Media [Iodinated Diagnostic Agents] Hives     Procedures This Admission:  1.  Implantation of a [employer] dual chamber PPM on 06/16/15 by Dr Lovena Le.  See op note for full details There were no immediate post procedure complications.  The previously implanted left sided pacemaker was programmed off 2.  CXR on 06/17/15 demonstrated no pneumothorax status post device implantation.   Brief HPI: [name] is a 81 y.o. male with a past medical history as outlined above. He has developed atrial lead dysfunction with loss of capture and atrial oversensing. Risks, benefits, and alternatives to new PPM system implantation were reviewed with the patient who wished to proceed.   Hospital Course:  The patient was admitted and underwent implantation of a BSX dual chamber pacemaker with details as outlined above.  He  was monitored on telemetry overnight which demonstrated atrial pacing with intrinsic ventricular conduction.  Right chest was without hematoma or ecchymosis.  The device was interrogated and found to be functioning normally.  CXR was obtained and demonstrated no pneumothorax status post device implantation.  Wound care, arm mobility, and restrictions were reviewed with the patient.  The patient was examined and considered stable  for discharge to home.    Physical Exam: Filed Vitals:   06/16/15 2000 06/16/15 2018 06/17/15 0532 06/17/15 0725  BP:  136/78 134/65 137/73  Pulse:  59 61 64  Temp:  98.1 F (36.7 C) 98.1 F (36.7 C) 98 F (36.7 C)  TempSrc:  Oral Oral Oral  Resp: 18 14 16 20   Height:      Weight:      SpO2:  96% 95% 98%    GEN- The patient is well appearing, alert and oriented x 3 today.   HEENT: normocephalic, atraumatic; sclera clear, conjunctiva pink; hearing intact; oropharynx clear; neck supple  Lungs- Clear to ausculation bilaterally, normal work of breathing.  No wheezes, rales, rhonchi Heart- Regular rate and rhythm  GI- soft, non-tender, non-distended, bowel sounds present  Extremities- no clubbing, cyanosis, or edema; DP/PT/radial pulses 2+ bilaterally MS- no significant deformity or atrophy Skin- warm and dry, no rash or lesion, right chest without hematoma/ecchymosis Psych- euthymic mood, full affect Neuro- strength and sensation are intact   Labs:   Lab Results  Component Value Date   WBC 7.9 06/16/2015   HGB 12.6* 06/16/2015   HCT 39.1 06/16/2015   MCV 87.5 06/16/2015   PLT 196 06/16/2015     Recent Labs Lab 06/16/15 1155  NA 140  K 3.9  CL 104  CO2 25  BUN 13  CREATININE 0.96  CALCIUM 9.0  GLUCOSE 128*    Discharge Medications:    Medication List    TAKE these medications        bisacodyl 5 MG EC tablet  Commonly known as:  DULCOLAX  Take 5 mg by mouth daily as needed (constipation).  butalbital-acetaminophen-caffeine 50-325-40 MG tablet  Commonly known as:  FIORICET, ESGIC  Take 1 tablet by mouth 2 (two) times daily as needed for migraine.     CoQ10 100 MG Caps  Take 100 mg by mouth daily.     Diclofenac Sodium 1.5 % Soln  Take 1 application by mouth daily as needed (Knee pain).     doxazosin 4 MG tablet  Commonly known as:  CARDURA  Take 4 mg by mouth at bedtime.     ELIQUIS 5 MG Tabs tablet  Generic drug:  apixaban  Take 5 mg by  mouth 2 (two) times daily.     HYDROcodone-acetaminophen 10-325 MG tablet  Commonly known as:  NORCO  Take 1 tablet by mouth every 4 (four) hours as needed for moderate pain or severe pain.     LORazepam 1 MG tablet  Commonly known as:  ATIVAN  Take one tablet by mouth at bedtime. For anxiety     MAGNESIUM PO  Take 1 tablet by mouth daily.     metoprolol succinate 25 MG 24 hr tablet  Commonly known as:  TOPROL-XL  Take 1 tablet (25 mg total) by mouth 2 (two) times daily.     multivitamin with minerals Tabs tablet  Take 1 tablet by mouth daily.     nitroGLYCERIN 0.4 MG SL tablet  Commonly known as:  NITROSTAT  Place 1 tablet (0.4 mg total) under the tongue every 5 (five) minutes as needed for chest pain.     potassium chloride SA 20 MEQ tablet  Commonly known as:  KLOR-CON M20  Take 1 tablet (20 mEq total) by mouth daily.     predniSONE 20 MG tablet  Commonly known as:  DELTASONE  Take 20 mg by mouth daily with breakfast.     rosuvastatin 20 MG tablet  Commonly known as:  CRESTOR  Take 1 tablet (20 mg total) by mouth at bedtime.        Disposition:  Discharge Instructions    Diet - low sodium heart healthy    Complete by:  As directed      Increase activity slowly    Complete by:  As directed           Follow-up Information    Follow up with [facility] On 06/26/2015.   Specialty:  Cardiology   Why:  at 10:30AM for wound check    Contact information:   [address] [place] [phone]      Duration of Discharge Encounter: Greater than 30 minutes including physician time.  Signed, Chanetta Marshall, NP 06/17/2015 8:31 AM  EP Attending  Patient seen and examined. Agree with the findings as noted above. His PPM has been interogated and CXR reviewed and found to be stable. [place] for discharge home. Usual followup.  Mikle Bosworth.D.

## 2015-06-17 NOTE — Discharge Instructions (Signed)
° ° °  Supplemental Discharge Instructions for  Pacemaker/Defibrillator Patients  Activity No heavy lifting or vigorous activity with your left/right arm for 6 to 8 weeks.  Do not raise your left/right arm above your head for one week.  Gradually raise your affected arm as drawn below.           __     06/21/15                     06/22/15                     06/23/15                  06/24/15   NO DRIVING for  1 week   ; you may begin driving on  [id number]   .  WOUND CARE - Keep the wound area clean and dry.  Do not get this area wet for one week. No showers for one week; you may shower on 06/24/15    . - The tape/steri-strips on your wound will fall off; do not pull them off.  No bandage is needed on the site.  DO  NOT apply any creams, oils, or ointments to the wound area. - If you notice any drainage or discharge from the wound, any swelling or bruising at the site, or you develop a fever > 101? F after you are discharged home, call the office at once.  Special Instructions - You are still able to use cellular telephones; use the ear opposite the side where you have your pacemaker/defibrillator.  Avoid carrying your cellular phone near your device. - When traveling through airports, show security personnel your identification card to avoid being screened in the metal detectors.  Ask the security personnel to use the hand wand. - Avoid arc welding equipment, MRI testing (magnetic resonance imaging), TENS units (transcutaneous nerve stimulators).  Call the office for questions about other devices. - Avoid electrical appliances that are in poor condition or are not properly grounded. - Microwave ovens are safe to be near or to operate.

## 2015-06-20 ENCOUNTER — Ambulatory Visit: Payer: Medicare Other

## 2015-06-24 MED FILL — DOXAZOSIN MESYLATE 4 MG TAB: 4 | 30 days supply | Qty: 30 | Fill #1

## 2015-06-24 MED FILL — METOPROLOL SUCC ER 25 MG TA: 25 | 90 days supply | Qty: 180 | Fill #2

## 2015-06-24 MED FILL — BUTALB-ACETAMIN-CAFF 50-325: 50-325-40 | 30 days supply | Qty: 60 | Fill #1

## 2015-06-24 MED FILL — ELIQUIS 5 MG TABLET: 5 | 30 days supply | Qty: 60 | Fill #7

## 2015-06-24 MED FILL — ROSUVASTATIN CALCIUM 20 MG: 20 | 90 days supply | Qty: 90 | Fill #3

## 2015-06-25 ENCOUNTER — Telehealth: Payer: Self-pay | Admitting: *Deleted

## 2015-06-25 NOTE — Telephone Encounter (Signed)
Pt is a 81 yo M w/ CAD, afib, and s/p AVR. S/p PPM. Taking metoprolol. Recurrent afib. Rates 110-140 bpm. Took dronedarone. Had a second dose of metoprolol. +Palpitations. Feeling dizzy. Takes apixaban. Has not missed any doses. Instructed pt that if HRs improve to less than 120 bpm and symptoms are tolerable can follow-up in AM. If HRs remain persistently elevated and symptomatic should come to ER for further evaluation. Pt's brother verbalized understanding.  Jay Schlichter, MD

## 2015-06-25 NOTE — Telephone Encounter (Signed)
Dr. Coralie Keens came to office and stated that patient is "back in A-fib".  Called patient and assisted him in setting up Latitude monitor to send manual transmission per request from Janan Halter, RN.  Patient able to send manual transmission, presenting rhythm shows AFl/Vs, rates 100-120s bpm.   Claiborne Billings, RN spoke with patient--he reports that he has weaned himself to prednisone 5mg  daily and has taken Multaq and Toprol-XL 25mg  in an attempt to lower his HR.  He states that he feels dizzy, but denies chest discomfort or SOB at this time.  He reported to her that he missed two doses of Eliquis prior to his gen change--one the night before and one the morning of the procedure.    Attempted to reach Dr. Lovena Le and Dr. Caryl Comes to seek recommendations.  Patient agreeable to rescheduling wound check to 2:00pm (from 10:30am) on 06/26/15 while Dr. Caryl Comes is in the office.  He requests that Dr. Caryl Comes call him with further recommendations.  Advised patient that I will forward this message, but that he may not get it until tomorrow as it is so late in the day.  Patient and wife are aware.  Advised patient to proceed to ED if his symptoms worsen or if develops chest discomfort or SOB.  Patient verbalizes understanding and denies additional device-related questions at this time.

## 2015-06-26 ENCOUNTER — Encounter: Payer: Self-pay | Admitting: Internal Medicine

## 2015-06-26 ENCOUNTER — Ambulatory Visit (INDEPENDENT_AMBULATORY_CARE_PROVIDER_SITE_OTHER): Payer: Medicare Other | Admitting: Internal Medicine

## 2015-06-26 ENCOUNTER — Encounter: Payer: Self-pay | Admitting: *Deleted

## 2015-06-26 VITALS — BP 123/65

## 2015-06-26 DIAGNOSIS — I481 Persistent atrial fibrillation: Secondary | ICD-10-CM

## 2015-06-26 DIAGNOSIS — I4819 Other persistent atrial fibrillation: Secondary | ICD-10-CM

## 2015-06-26 NOTE — Progress Notes (Signed)
Patient Care Team: [name], MD as PCP - General (Endocrinology) Evans Lance, MD (Cardiology) Hillary Bow, MD (Cardiology) Bo Merino, MD as Consulting Physician (Rheumatology)   HPI  [name] is a 81 y.o. male With hx of recurrent atrial arrhythmias s/p MAZE LAA ligation  at time of AVR, PVC ( up to 7%) dysautonomia and sinus node dysfunction s/p pacemaker Presents today a few weeks following implantation of a new pacing system having developed recurrent atrial flutter with RVR  He had been on amiodarone but had developed suspected pulmonary toxicity; seen at [facility] 8/16 with discussion of ablation- no comment on LA size ( 59/2.6/55)  Records reviewed  Records and Results Reviewed As above   Past Medical History  Diagnosis Date  . 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  . Coronary artery disease     status post multiple prior percutaneous coronary interventions, microvascular angina per Dr Olevia Perches  . Aortic stenosis     moderate aortic stenosis  . Hyperlipidemia   . Hypercoagulable state ([place])     chronically anticoagulated with coumadin  . Heart murmur   . Stroke ([place])     1990  . Hyperthyroidism   . Benign prostatic hypertrophy   . Arthritis   . Paroxysmal atrial fibrillation ([place])     DR. Lia Foyer,   . Hypothyroidism     Dr. Elyse Hsu  . MGUS (monoclonal gammopathy of unknown significance) 02/17/2013    Past Surgical History  Procedure Laterality Date  . Pacemaker insertion  1991    Guidant PPM, most recent Generator Change by Dr Olevia Perches was 08/22/06  . Hemrrhoidectomy    . Cardiac catheterization      11  . Cardioversion    . Appendectomy    . Aortic valve replacement  03/15/2011    Procedure: AORTIC VALVE REPLACEMENT (AVR);  Surgeon: Gaye Pollack, MD;  Location: [place];  Service: Open Heart Surgery;  Laterality: N/A;  . Maze  03/15/2011    Procedure: MAZE;  Surgeon: Gaye Pollack, MD;  Location: [place];  Service: Open Heart Surgery;  Laterality: N/A;  . Tee without cardioversion  04/15/2011    Procedure: TRANSESOPHAGEAL ECHOCARDIOGRAM (TEE);  Surgeon: Loralie Champagne, MD;  Location: [place];  Service: Cardiovascular;  Laterality: N/A;  . Cardioversion  04/15/2011    Procedure: CARDIOVERSION;  Surgeon: Loralie Champagne, MD;  Location: [facility] ENDOSCOPY;  Service: Cardiovascular;  Laterality: N/A;  . Left and right heart catheterization with coronary angiogram Bilateral 02/01/2011    Procedure: LEFT AND RIGHT HEART CATHETERIZATION WITH CORONARY ANGIOGRAM;  Surgeon: Hillary Bow, MD;  Location: [facility] CATH LAB;  Service: Cardiovascular;  Laterality: Bilateral;  . Tee without cardioversion N/A 09/11/2014    Procedure: TRANSESOPHAGEAL ECHOCARDIOGRAM (TEE);  Surgeon: Sueanne Margarita, MD;  Location: [place];  Service: Cardiovascular;  Laterality: N/A;  . Cardioversion N/A 09/11/2014    Procedure: CARDIOVERSION;  Surgeon: Sueanne Margarita, MD;  Location: [facility] ENDOSCOPY;  Service: Cardiovascular;  Laterality: N/A;  . Ep implantable device N/A 06/16/2015    Procedure: Pacemaker Implant;  Surgeon: Evans Lance, MD;  Location: [place] CV LAB;  Service: Cardiovascular;  Laterality: N/A;    Current Outpatient Prescriptions  Medication Sig Dispense Refill  . apixaban (ELIQUIS) 5 MG TABS tablet Take 5 mg by mouth 2 (two)  times daily.    . bisacodyl (DULCOLAX) 5 MG EC tablet Take 5 mg by mouth daily as needed (constipation).     . butalbital-acetaminophen-caffeine (FIORICET, ESGIC) 50-325-40 MG per tablet Take 1 tablet by mouth 2 (two) times daily as needed (osteo-arthritis pain).     . Coenzyme Q10 (COQ10) 100 MG CAPS Take 100 mg by mouth daily.    . Diclofenac Sodium 1.5 % SOLN Take 1 application by mouth 2 (two) times daily as needed (Knee pain).     Marland Kitchen doxazosin (CARDURA) 4  MG tablet Take 4 mg by mouth at bedtime.     Marland Kitchen HYDROcodone-acetaminophen (NORCO) 10-325 MG tablet Take 0.5-1 tablets by mouth 2 (two) times daily as needed for moderate pain or severe pain.   0  . LORazepam (ATIVAN) 1 MG tablet Take 1 mg by mouth at bedtime.     . magnesium oxide (MAG-OX) 400 MG tablet Take 400 mg by mouth daily.    . metoprolol succinate (TOPROL-XL) 25 MG 24 hr tablet Take 1 tablet (25 mg total) by mouth 2 (two) times daily. (Patient taking differently: Take 12.5-50 mg by mouth 2 (two) times daily. To control heart rate) 180 tablet 3  . Multiple Vitamin (MULTIVITAMIN WITH MINERALS) TABS tablet Take 1 tablet by mouth daily.    . nitroGLYCERIN (NITROSTAT) 0.4 MG SL tablet Place 1 tablet (0.4 mg total) under the tongue every 5 (five) minutes as needed for chest pain. 25 tablet 3  . OVER THE COUNTER MEDICATION Apply 1 patch topically daily as needed (neck and shoulder pain). Chinese pain patch    . potassium chloride SA (KLOR-CON M20) 20 MEQ tablet Take 1 tablet (20 mEq total) by mouth daily. 90 tablet 3  . predniSONE (DELTASONE) 5 MG tablet Take 5 mg by mouth every other day.  4  . rosuvastatin (CRESTOR) 20 MG tablet Take 1 tablet (20 mg total) by mouth at bedtime. (Patient taking differently: Take 20 mg by mouth every other day. At night) 90 tablet 3   No current facility-administered medications for this visit.    Allergies  Allergen Reactions  . Contrast Media [Iodinated Diagnostic Agents] Hives      Review of Systems negative except from HPI and PMH  Physical Exam BP 123/65 mmHg Well developed and well nourished in no acute distress HENT normal E scleral and icterus clear Neck Supple JVP flat; carotids brisk and full Clear to ausculation  Regular rate and rhythm, no murmurs gallops or rub Soft with active bowel sounds No clubbing cyanosis { a: Edema Alert and oriented, grossly normal motor and sensory function Skin Warm and Dry    Assessment and   Plan  Atrial flutter / fibrillation  Pacemaker [employer]    Unable to paceterminate-- ate breakfast this am so will arrange cardioversion in am Worth giving thought to antiarrhythmic options-- dofetilide is worth considering--JTc appears within range as has RBBB-- i am not experienced enough to know how successful ablation might be with massive LA enlargement-- these data from 6 months ago   Pt was off apixoban at time of generator replacmenet but has been back on since return of tachycardia.  hjence do not think he needs TEE

## 2015-06-27 ENCOUNTER — Encounter (HOSPITAL_COMMUNITY)
Admission: RE | Disposition: A | Discharge status: Home / Self Care | Payer: Self-pay | Source: Ambulatory Visit | Attending: Cardiovascular Disease

## 2015-06-27 ENCOUNTER — Ambulatory Visit (HOSPITAL_COMMUNITY)
Admission: RE | Admit: 2015-06-27 | Discharge: 2015-06-27 | Disposition: A | Discharge status: Home / Self Care | Payer: Medicare Other | Source: Ambulatory Visit | Attending: Cardiovascular Disease | Admitting: Cardiovascular Disease

## 2015-06-27 ENCOUNTER — Encounter (HOSPITAL_COMMUNITY): Payer: Self-pay | Admitting: Anesthesiology

## 2015-06-27 ENCOUNTER — Ambulatory Visit (HOSPITAL_COMMUNITY): Payer: Medicare Other | Admitting: Anesthesiology

## 2015-06-27 DIAGNOSIS — Z7901 Long term (current) use of anticoagulants: Secondary | ICD-10-CM | POA: Diagnosis not present

## 2015-06-27 DIAGNOSIS — I4892 Unspecified atrial flutter: Secondary | ICD-10-CM | POA: Insufficient documentation

## 2015-06-27 DIAGNOSIS — I480 Paroxysmal atrial fibrillation: Secondary | ICD-10-CM | POA: Insufficient documentation

## 2015-06-27 DIAGNOSIS — Z950 Presence of cardiac pacemaker: Secondary | ICD-10-CM | POA: Diagnosis not present

## 2015-06-27 DIAGNOSIS — D6859 Other primary thrombophilia: Secondary | ICD-10-CM | POA: Diagnosis not present

## 2015-06-27 DIAGNOSIS — I10 Essential (primary) hypertension: Secondary | ICD-10-CM | POA: Diagnosis not present

## 2015-06-27 DIAGNOSIS — I2510 Atherosclerotic heart disease of native coronary artery without angina pectoris: Secondary | ICD-10-CM | POA: Insufficient documentation

## 2015-06-27 DIAGNOSIS — Z952 Presence of prosthetic heart valve: Secondary | ICD-10-CM | POA: Insufficient documentation

## 2015-06-27 DIAGNOSIS — Z79899 Other long term (current) drug therapy: Secondary | ICD-10-CM | POA: Diagnosis not present

## 2015-06-27 DIAGNOSIS — I4891 Unspecified atrial fibrillation: Secondary | ICD-10-CM | POA: Diagnosis not present

## 2015-06-27 DIAGNOSIS — Z7952 Long term (current) use of systemic steroids: Secondary | ICD-10-CM | POA: Insufficient documentation

## 2015-06-27 DIAGNOSIS — E785 Hyperlipidemia, unspecified: Secondary | ICD-10-CM | POA: Diagnosis not present

## 2015-06-27 DIAGNOSIS — I483 Typical atrial flutter: Secondary | ICD-10-CM | POA: Diagnosis not present

## 2015-06-27 DIAGNOSIS — Z8673 Personal history of transient ischemic attack (TIA), and cerebral infarction without residual deficits: Secondary | ICD-10-CM | POA: Diagnosis not present

## 2015-06-27 DIAGNOSIS — I4819 Other persistent atrial fibrillation: Secondary | ICD-10-CM

## 2015-06-27 HISTORY — PX: CARDIOVERSION: SHX1299

## 2015-06-27 LAB — CUP PACEART INCLINIC DEVICE CHECK
Brady Statistic RA Percent Paced: 57 %
Brady Statistic RV Percent Paced: 1 % — CL
Date Time Interrogation Session: 20170427040000
Implantable Lead Location: 753859
Implantable Lead Location: 753860
Lead Channel Impedance Value: 679 Ohm
Lead Channel Sensing Intrinsic Amplitude: 4 mV
Lead Channel Setting Pacing Amplitude: 5 V
MDC IDC LEAD IMPLANT DT: 20170417
MDC IDC LEAD IMPLANT DT: 20170417
MDC IDC LEAD SERIAL: 662696
MDC IDC LEAD SERIAL: 751382
MDC IDC MSMT LEADCHNL RV IMPEDANCE VALUE: 717 Ohm
MDC IDC MSMT LEADCHNL RV PACING THRESHOLD AMPLITUDE: 0.6 V
MDC IDC MSMT LEADCHNL RV PACING THRESHOLD PULSEWIDTH: 0.4 ms
MDC IDC MSMT LEADCHNL RV SENSING INTR AMPL: 24.4 mV
Pulse Gen Serial Number: 718418

## 2015-06-27 SURGERY — CARDIOVERSION
Anesthesia: Monitor Anesthesia Care

## 2015-06-27 MED ORDER — PROPOFOL 10 MG/ML IV BOLUS
INTRAVENOUS | Status: AC
  Filled 2015-06-27: qty 40

## 2015-06-27 MED ORDER — LIDOCAINE HCL (CARDIAC) 20 MG/ML IV SOLN
INTRAVENOUS | Status: DC | PRN
  Administered 2015-06-27: 40 mg via INTRAVENOUS

## 2015-06-27 MED ORDER — SODIUM CHLORIDE 0.9 % IV SOLN
INTRAVENOUS | Status: DC
  Administered 2015-06-27: 12:00:00 via INTRAVENOUS

## 2015-06-27 MED ORDER — PROPOFOL 10 MG/ML IV BOLUS
INTRAVENOUS | Status: DC | PRN
  Administered 2015-06-27: 100 mg via INTRAVENOUS

## 2015-06-27 MED FILL — HYDROCODON-APAP 10-325: 10-325 | 50 days supply | Qty: 100 | Fill #0

## 2015-06-27 NOTE — CV Procedure (Signed)
    Cardioversion Note  TREYVON VENUTI [id number] 28-Oct-1934  Procedure: DC Cardioversion Indications: atrial flutter   Procedure Details Consent: Obtained Time Out: Verified patient identification, verified procedure, site/side was marked, verified correct patient position, special equipment/implants available, Radiology Safety Procedures followed,  medications/allergies/relevent history reviewed, required imaging and test results available.  Performed  The patient has been on adequate anticoagulation.  The patient received IV Lidocaine 40 mg IV followed by Propofol 100 mg IV  for sedation.  Synchronous cardioversion was performed at 50 joules.  The cardioversion was successful     Complications: No apparent complications Patient did tolerate procedure well.   Thayer Headings, Brooke Bonito., MD, [facility] 06/27/2015, 12:03 PM

## 2015-06-27 NOTE — H&P (View-Only) (Signed)
Patient Care Team: [name], MD as PCP - General (Endocrinology) Evans Lance, MD (Cardiology) Hillary Bow, MD (Cardiology) Bo Merino, MD as Consulting Physician (Rheumatology)   HPI  [name] is a 81 y.o. male With hx of recurrent atrial arrhythmias s/p MAZE LAA ligation  at time of AVR, PVC ( up to 7%) dysautonomia and sinus node dysfunction s/p pacemaker Presents today a few weeks following implantation of a new pacing system having developed recurrent atrial flutter with RVR  He had been on amiodarone but had developed suspected pulmonary toxicity; seen at [facility] 8/16 with discussion of ablation- no comment on LA size ( 59/2.6/55)  Records reviewed  Records and Results Reviewed As above   Past Medical History  Diagnosis Date  . 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  . Coronary artery disease     status post multiple prior percutaneous coronary interventions, microvascular angina per Dr Olevia Perches  . Aortic stenosis     moderate aortic stenosis  . Hyperlipidemia   . Hypercoagulable state ([place])     chronically anticoagulated with coumadin  . Heart murmur   . Stroke ([place])     1990  . Hyperthyroidism   . Benign prostatic hypertrophy   . Arthritis   . Paroxysmal atrial fibrillation ([place])     DR. Lia Foyer,   . Hypothyroidism     Dr. Elyse Hsu  . MGUS (monoclonal gammopathy of unknown significance) 02/17/2013    Past Surgical History  Procedure Laterality Date  . Pacemaker insertion  1991    Guidant PPM, most recent Generator Change by Dr Olevia Perches was 08/22/06  . Hemrrhoidectomy    . Cardiac catheterization      11  . Cardioversion    . Appendectomy    . Aortic valve replacement  03/15/2011    Procedure: AORTIC VALVE REPLACEMENT (AVR);  Surgeon: Gaye Pollack, MD;  Location: [place];  Service: Open Heart Surgery;  Laterality: N/A;  . Maze  03/15/2011    Procedure: MAZE;  Surgeon: Gaye Pollack, MD;  Location: [place];  Service: Open Heart Surgery;  Laterality: N/A;  . Tee without cardioversion  04/15/2011    Procedure: TRANSESOPHAGEAL ECHOCARDIOGRAM (TEE);  Surgeon: Loralie Champagne, MD;  Location: [place];  Service: Cardiovascular;  Laterality: N/A;  . Cardioversion  04/15/2011    Procedure: CARDIOVERSION;  Surgeon: Loralie Champagne, MD;  Location: [facility] ENDOSCOPY;  Service: Cardiovascular;  Laterality: N/A;  . Left and right heart catheterization with coronary angiogram Bilateral 02/01/2011    Procedure: LEFT AND RIGHT HEART CATHETERIZATION WITH CORONARY ANGIOGRAM;  Surgeon: Hillary Bow, MD;  Location: [facility] CATH LAB;  Service: Cardiovascular;  Laterality: Bilateral;  . Tee without cardioversion N/A 09/11/2014    Procedure: TRANSESOPHAGEAL ECHOCARDIOGRAM (TEE);  Surgeon: Sueanne Margarita, MD;  Location: [place];  Service: Cardiovascular;  Laterality: N/A;  . Cardioversion N/A 09/11/2014    Procedure: CARDIOVERSION;  Surgeon: Sueanne Margarita, MD;  Location: [facility] ENDOSCOPY;  Service: Cardiovascular;  Laterality: N/A;  . Ep implantable device N/A 06/16/2015    Procedure: Pacemaker Implant;  Surgeon: Evans Lance, MD;  Location: [place] CV LAB;  Service: Cardiovascular;  Laterality: N/A;    Current Outpatient Prescriptions  Medication Sig Dispense Refill  . apixaban (ELIQUIS) 5 MG TABS tablet Take 5 mg by mouth 2 (two)  times daily.    . bisacodyl (DULCOLAX) 5 MG EC tablet Take 5 mg by mouth daily as needed (constipation).     . butalbital-acetaminophen-caffeine (FIORICET, ESGIC) 50-325-40 MG per tablet Take 1 tablet by mouth 2 (two) times daily as needed (osteo-arthritis pain).     . Coenzyme Q10 (COQ10) 100 MG CAPS Take 100 mg by mouth daily.    . Diclofenac Sodium 1.5 % SOLN Take 1 application by mouth 2 (two) times daily as needed (Knee pain).     Marland Kitchen doxazosin (CARDURA) 4  MG tablet Take 4 mg by mouth at bedtime.     Marland Kitchen HYDROcodone-acetaminophen (NORCO) 10-325 MG tablet Take 0.5-1 tablets by mouth 2 (two) times daily as needed for moderate pain or severe pain.   0  . LORazepam (ATIVAN) 1 MG tablet Take 1 mg by mouth at bedtime.     . magnesium oxide (MAG-OX) 400 MG tablet Take 400 mg by mouth daily.    . metoprolol succinate (TOPROL-XL) 25 MG 24 hr tablet Take 1 tablet (25 mg total) by mouth 2 (two) times daily. (Patient taking differently: Take 12.5-50 mg by mouth 2 (two) times daily. To control heart rate) 180 tablet 3  . Multiple Vitamin (MULTIVITAMIN WITH MINERALS) TABS tablet Take 1 tablet by mouth daily.    . nitroGLYCERIN (NITROSTAT) 0.4 MG SL tablet Place 1 tablet (0.4 mg total) under the tongue every 5 (five) minutes as needed for chest pain. 25 tablet 3  . OVER THE COUNTER MEDICATION Apply 1 patch topically daily as needed (neck and shoulder pain). Chinese pain patch    . potassium chloride SA (KLOR-CON M20) 20 MEQ tablet Take 1 tablet (20 mEq total) by mouth daily. 90 tablet 3  . predniSONE (DELTASONE) 5 MG tablet Take 5 mg by mouth every other day.  4  . rosuvastatin (CRESTOR) 20 MG tablet Take 1 tablet (20 mg total) by mouth at bedtime. (Patient taking differently: Take 20 mg by mouth every other day. At night) 90 tablet 3   No current facility-administered medications for this visit.    Allergies  Allergen Reactions  . Contrast Media [Iodinated Diagnostic Agents] Hives      Review of Systems negative except from HPI and PMH  Physical Exam BP 123/65 mmHg Well developed and well nourished in no acute distress HENT normal E scleral and icterus clear Neck Supple JVP flat; carotids brisk and full Clear to ausculation  Regular rate and rhythm, no murmurs gallops or rub Soft with active bowel sounds No clubbing cyanosis { a: Edema Alert and oriented, grossly normal motor and sensory function Skin Warm and Dry    Assessment and   Plan  Atrial flutter / fibrillation  Pacemaker [employer]    Unable to paceterminate-- ate breakfast this am so will arrange cardioversion in am Worth giving thought to antiarrhythmic options-- dofetilide is worth considering--JTc appears within range as has RBBB-- i am not experienced enough to know how successful ablation might be with massive LA enlargement-- these data from 6 months ago   Pt was off apixoban at time of generator replacmenet but has been back on since return of tachycardia.  hjence do not think he needs TEE

## 2015-06-27 NOTE — Interval H&P Note (Signed)
History and Physical Interval Note:  06/27/2015 8:20 AM  [name]  has presented today for surgery, with the diagnosis of AFIB  The various methods of treatment have been discussed with the patient and family. After consideration of risks, benefits and other options for treatment, the patient has consented to  Procedure(s): CARDIOVERSION (N/A) as a surgical intervention .  The patient's history has been reviewed, patient examined, no change in status, stable for surgery.  I have reviewed the patient's chart and labs.  Questions were answered to the patient's satisfaction.     [name], Wonda Cheng

## 2015-06-27 NOTE — Anesthesia Preprocedure Evaluation (Addendum)
Anesthesia Evaluation  Patient identified by MRN, date of birth, ID band Patient awake    Reviewed: Allergy & Precautions, NPO status , Patient's Chart, lab work & pertinent test results, reviewed documented beta blocker date and time   Airway Mallampati: I  TM Distance: >3 FB Neck ROM: Full    Dental  (+) Teeth Intact   Pulmonary neg pulmonary ROS,    Pulmonary exam normal        Cardiovascular hypertension, Pt. on home beta blockers and Pt. on medications + CAD  Normal cardiovascular exam+ pacemaker + Valvular Problems/Murmurs AS   12/2014 Echo: Left ventricle: The cavity size was normal. There was mild  concentric hypertrophy. Systolic function was normal. The  estimated ejection fraction was in the range of 60% to 65%. Wall  motion was normal; there were no regional wall motion  abnormalities. - Aortic valve: A bioprosthesis was present and functioning  normally. Peak velocity (S): 267 cm/s. Mean gradient (S): 14 mm  Hg. - Aorta: Ascending aortic diameter: 40 mm (S). - Mitral valve: Moderately calcified annulus. - Left atrium: The atrium was severely dilated. - Right atrium: The atrium was moderately dilated.   Neuro/Psych CVA    GI/Hepatic negative GI ROS, Neg liver ROS,   Endo/Other  negative endocrine ROS  Renal/GU negative Renal ROS     Musculoskeletal   Abdominal   Peds  Hematology negative hematology ROS (+)   Anesthesia Other Findings   Reproductive/Obstetrics                           Anesthesia Physical  Anesthesia Plan  ASA: III  Anesthesia Plan: MAC and General   Post-op Pain Management:    Induction: Intravenous  Airway Management Planned: Natural Airway and Mask  Additional Equipment:   Intra-op Plan:   Post-operative Plan:   Informed Consent: I have reviewed the patients History and Physical, chart, labs and discussed the procedure including the  risks, benefits and alternatives for the proposed anesthesia with the patient or authorized representative who has indicated his/her understanding and acceptance.     Plan Discussed with: CRNA and Surgeon  Anesthesia Plan Comments:         Anesthesia Quick Evaluation

## 2015-06-27 NOTE — Discharge Instructions (Signed)
Electrical Cardioversion, Care After °Refer to this sheet in the next few weeks. These instructions provide you with information on caring for yourself after your procedure. Your health care provider may also give you more specific instructions. Your treatment has been planned according to current medical practices, but problems sometimes occur. Call your health care provider if you have any problems or questions after your procedure. °WHAT TO EXPECT AFTER THE PROCEDURE °After your procedure, it is typical to have the following sensations: °· Some redness on the skin where the shocks were delivered. If this is tender, a sunburn lotion or hydrocortisone cream may help. °· Possible return of an abnormal heart rhythm within hours or days after the procedure. °HOME CARE INSTRUCTIONS °· Take medicines only as directed by your health care provider. Be sure you understand how and when to take your medicine. °· Learn how to feel your pulse and check it often. °· Limit your activity for 48 hours after the procedure or as directed by your health care provider. °· Avoid or minimize caffeine and other stimulants as directed by your health care provider. °SEEK MEDICAL CARE IF: °· You feel like your heart is beating too fast or your pulse is not regular. °· You have any questions about your medicines. °· You have bleeding that will not stop. °SEEK IMMEDIATE MEDICAL CARE IF: °· You are dizzy or feel faint. °· It is hard to breathe or you feel short of breath. °· There is a change in discomfort in your chest. °· Your speech is slurred or you have trouble moving an arm or leg on one side of your body. °· You get a serious muscle cramp that does not go away. °· Your fingers or toes turn cold or blue. °  °This information is not intended to replace advice given to you by your health care provider. Make sure you discuss any questions you have with your health care provider. °  °Document Released: 12/06/2012 Document Revised: 03/08/2014  Document Reviewed: 12/06/2012 °Elsevier Interactive Patient Education ©2016 [employer] Inc[employer] ° °

## 2015-06-27 NOTE — Transfer of Care (Signed)
Immediate Anesthesia Transfer of Care Note  Patient: [name]  Procedure(s) Performed: Procedure(s): CARDIOVERSION (N/A)  Patient Location: PACU  Anesthesia Type:MAC  Level of Consciousness: awake, alert , oriented and patient cooperative  Airway & Oxygen Therapy: Patient Spontanous Breathing and Patient connected to nasal cannula oxygen  Post-op Assessment: Report given to RN, Post -op Vital signs reviewed and stable and Patient moving all extremities  Post vital signs: Reviewed and stable  Last Vitals:  Filed Vitals:   06/27/15 1108 06/27/15 1214  BP: 134/116 107/85  Pulse: 137 48  Temp: 36.4 C   Resp: 16 16    Last Pain: There were no vitals filed for this visit.       Complications: No apparent anesthesia complications

## 2015-06-27 NOTE — Anesthesia Postprocedure Evaluation (Signed)
Anesthesia Post Note  Patient: [name]  Procedure(s) Performed: Procedure(s) (LRB): CARDIOVERSION (N/A)  Patient location during evaluation: Endoscopy Anesthesia Type: MAC Level of consciousness: awake, awake and alert, oriented and patient cooperative Pain management: pain level controlled Vital Signs Assessment: post-procedure vital signs reviewed and stable Respiratory status: spontaneous breathing Cardiovascular status: blood pressure returned to baseline and stable Anesthetic complications: no    Last Vitals:  Filed Vitals:   06/27/15 1108 06/27/15 1214  BP: 134/116 107/85  Pulse: 137 48  Temp: 36.4 C   Resp: 16 16    Last Pain: There were no vitals filed for this visit.               Destry Bezdek

## 2015-06-28 ENCOUNTER — Encounter (HOSPITAL_COMMUNITY): Payer: Self-pay | Admitting: Cardiovascular Disease

## 2015-07-01 ENCOUNTER — Encounter: Payer: Self-pay | Admitting: Internal Medicine

## 2015-07-01 ENCOUNTER — Ambulatory Visit (INDEPENDENT_AMBULATORY_CARE_PROVIDER_SITE_OTHER): Payer: Medicare Other | Admitting: Internal Medicine

## 2015-07-01 VITALS — BP 144/88 | Ht 73.0 in | Wt 229.0 lb

## 2015-07-01 DIAGNOSIS — I480 Paroxysmal atrial fibrillation: Secondary | ICD-10-CM | POA: Diagnosis not present

## 2015-07-01 LAB — CUP PACEART INCLINIC DEVICE CHECK
Implantable Lead Implant Date: 20170417
Implantable Lead Implant Date: 20170417
Implantable Lead Location: 753859
Implantable Lead Model: 7740
Implantable Lead Serial Number: 662696
Lead Channel Setting Pacing Amplitude: 3.5 V
Lead Channel Setting Pacing Amplitude: 3.5 V
Lead Channel Setting Pacing Pulse Width: 0.4 ms
MDC IDC LEAD LOCATION: 753860
MDC IDC LEAD SERIAL: 751382
MDC IDC PG SERIAL: 718418
MDC IDC SESS DTM: 20170502040000
MDC IDC SET LEADCHNL RV SENSING SENSITIVITY: 2.5 mV
MDC IDC STAT BRADY RA PERCENT PACED: 0 %
MDC IDC STAT BRADY RV PERCENT PACED: 0 %

## 2015-07-01 NOTE — Patient Instructions (Signed)
Medication Instructions:  Your physician recommends that you continue on your current medications as directed. Please refer to the Current Medication list given to you today.   Labwork: None ordered   Testing/Procedures: None ordered   Follow-Up: Your physician recommends that you schedule a follow-up appointment in: June with Dr Lovena Le   Any Other Special Instructions Will Be Listed Below (If Applicable).     If you need a refill on your cardiac medications before your next appointment, please call your pharmacy.

## 2015-07-01 NOTE — Progress Notes (Signed)
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 The patient has an extensive cardiac history with long-standing hypertension and sinus node dysfunction, status post pacemaker insertion, CAD, s/p multiple interventions, PAF, and is s/p AVR for aortic stenosis with surgical MAZE. The patient had maintained sinus rhythm on amiodarone therapy. He developed thyroid dysfunction and possible pulmonary toxicity and his amio has been stopped. He has had PAF and symptomatic PVC's. He was seen by Dr. Greggory Brandy and thought not to have enough symptoms to undergo catheter ablation of atrial fib. He has developed worsening symptoms. His 81 yo PPM leads began to experience noise resulting in inhibition of pacing and he went insertion of a new DDD PM on the right side about 3 weeks ago. He has had worsening atrial fib/flutter. He gets dizzy with exertion as well as chest pressure and sob. He feels like his heart is beating too fast. He has taken additional beta blocker. Allergies  Allergen Reactions  . Contrast Media [Iodinated Diagnostic Agents] Hives     Current Outpatient Prescriptions  Medication Sig Dispense Refill  . apixaban (ELIQUIS) 5 MG TABS tablet Take 5 mg by mouth 2 (two) times daily.    . bisacodyl (DULCOLAX) 5 MG EC tablet Take 5 mg by mouth daily as needed (constipation).     . butalbital-acetaminophen-caffeine (FIORICET, ESGIC) 50-325-40 MG per tablet Take 1 tablet by mouth 2 (two) times daily as needed (osteo-arthritis pain).     . Coenzyme Q10 (COQ10) 100 MG CAPS Take 100 mg by mouth daily.    . Diclofenac Sodium 1.5 % SOLN Take 1 application by mouth 2 (two) times daily as needed (Knee pain).     Marland Kitchen doxazosin (CARDURA) 4 MG tablet Take 4 mg by mouth at bedtime.     Marland Kitchen HYDROcodone-acetaminophen (NORCO) 10-325 MG tablet Take 0.5-1 tablets by mouth 2 (two) times daily as needed for moderate pain or severe pain.   0  . LORazepam (ATIVAN) 1 MG tablet Take 1 mg by mouth at  bedtime.     . magnesium oxide (MAG-OX) 400 MG tablet Take 400 mg by mouth daily.    . metoprolol succinate (TOPROL-XL) 25 MG 24 hr tablet Take 1 tablet (25 mg total) by mouth 2 (two) times daily. (Patient taking differently: Take 12.5-50 mg by mouth 2 (two) times daily. To control heart rate) 180 tablet 3  . Multiple Vitamin (MULTIVITAMIN WITH MINERALS) TABS tablet Take 1 tablet by mouth daily.    . nitroGLYCERIN (NITROSTAT) 0.4 MG SL tablet Place 1 tablet (0.4 mg total) under the tongue every 5 (five) minutes as needed for chest pain. 25 tablet 3  . OVER THE COUNTER MEDICATION Apply 1 patch topically daily as needed (neck and shoulder pain). Chinese pain patch    . potassium chloride SA (KLOR-CON M20) 20 MEQ tablet Take 1 tablet (20 mEq total) by mouth daily. 90 tablet 3  . predniSONE (DELTASONE) 5 MG tablet Take 5 mg by mouth every other day.  4  . rosuvastatin (CRESTOR) 20 MG tablet Take 1 tablet (20 mg total) by mouth at bedtime. (Patient taking differently: Take 20 mg by mouth every other day. At night) 90 tablet 3   No current facility-administered medications for this visit.     Past Medical History  Diagnosis Date  . Chronotropic incompetence with sinus node dysfunction (HCC)     Status post Guidant dual-mode, dual-pacing, dual-sensing  pacemaker   implantation now programmed to  AAI with recent generator change.  . Coronary artery disease     status post multiple prior percutaneous coronary interventions, microvascular angina per Dr Olevia Perches  . Aortic stenosis     moderate aortic stenosis  . Hyperlipidemia   . Hypercoagulable state ([place])     chronically anticoagulated with coumadin  . Heart murmur   . Stroke ([place])     1990  . Hyperthyroidism   . Benign prostatic hypertrophy   . Arthritis   . Paroxysmal atrial fibrillation ([place])     DR. Lia Foyer,   . Hypothyroidism     Dr. Elyse Hsu  . MGUS (monoclonal gammopathy of unknown significance) 02/17/2013    ROS:   All systems  reviewed and negative except as noted in the HPI.   Past Surgical History  Procedure Laterality Date  . Pacemaker insertion  1991    Guidant PPM, most recent Generator Change by Dr Olevia Perches was 08/22/06  . Hemrrhoidectomy    . Cardiac catheterization      11  . Cardioversion    . Appendectomy    . Aortic valve replacement  03/15/2011    Procedure: AORTIC VALVE REPLACEMENT (AVR);  Surgeon: Gaye Pollack, MD;  Location: [place];  Service: Open Heart Surgery;  Laterality: N/A;  . Maze  03/15/2011    Procedure: MAZE;  Surgeon: Gaye Pollack, MD;  Location: [place];  Service: Open Heart Surgery;  Laterality: N/A;  . Tee without cardioversion  04/15/2011    Procedure: TRANSESOPHAGEAL ECHOCARDIOGRAM (TEE);  Surgeon: Loralie Champagne, MD;  Location: [place];  Service: Cardiovascular;  Laterality: N/A;  . Cardioversion  04/15/2011    Procedure: CARDIOVERSION;  Surgeon: Loralie Champagne, MD;  Location: [facility] ENDOSCOPY;  Service: Cardiovascular;  Laterality: N/A;  . Left and right heart catheterization with coronary angiogram Bilateral 02/01/2011    Procedure: LEFT AND RIGHT HEART CATHETERIZATION WITH CORONARY ANGIOGRAM;  Surgeon: Hillary Bow, MD;  Location: [facility] CATH LAB;  Service: Cardiovascular;  Laterality: Bilateral;  . Tee without cardioversion N/A 09/11/2014    Procedure: TRANSESOPHAGEAL ECHOCARDIOGRAM (TEE);  Surgeon: Sueanne Margarita, MD;  Location: [place];  Service: Cardiovascular;  Laterality: N/A;  . Cardioversion N/A 09/11/2014    Procedure: CARDIOVERSION;  Surgeon: Sueanne Margarita, MD;  Location: [facility] ENDOSCOPY;  Service: Cardiovascular;  Laterality: N/A;  . Ep implantable device N/A 06/16/2015    Procedure: Pacemaker Implant;  Surgeon: Evans Lance, MD;  Location: [place] CV LAB;  Service: Cardiovascular;  Laterality: N/A;  . Cardioversion N/A 06/27/2015    Procedure: CARDIOVERSION;  Surgeon: Thayer Headings, MD;  Location: [facility] ENDOSCOPY;  Service: Cardiovascular;  Laterality: N/A;      Family History  Problem Relation Age of Onset  . Heart disease Brother     Twin brother has coronary disease and recent AVR for AS  . Anesthesia problems Neg Hx   . Hypotension Neg Hx   . Malignant hyperthermia Neg Hx   . Pseudochol deficiency Neg Hx      Social History   Social History  . Marital Status: Married    Spouse Name: N/A  . Number of Children: N/A  . Years of Education: N/A   Occupational History  . Not on file.   Social History Main Topics  . Smoking status: Never Smoker   . Smokeless tobacco: Not on file  . Alcohol Use: 0.0 oz/week    0 Standard drinks or equivalent per week     Comment: Rarely.  . Drug  Use: No  . Sexual Activity: Not on file   Other Topics Concern  . Not on file   Social History Narrative     BP 144/88 mmHg  Ht 6\' 1"  (1.854 m)  Wt 229 lb (103.874 kg)  BMI 30.22 kg/m2  Physical Exam:  Well appearing 81 year old man, NAD HEENT: Unremarkable Neck:  6 cm JVD, no thyromegally Back:  No CVA tenderness Lungs:  Clear with no wheezes, rales, or rhonchi. HEART:  IRegular tachy rhythm, split S2. Abd:  soft, positive bowel sounds, no organomegally, no rebound, no guarding Ext:  2 plus pulses, no edema, no cyanosis, no clubbing Skin:  No rashes no nodules Neuro:  CN II through XII intact, motor grossly intact   DEVICE  Normal device function.  See PaceArt for details. He has had atrial fib/flutter and is in this rhythm today. His new right sided DDD PM is working normally.   Assess/Plan:  1. Atrial fib/flutter with a RVR - his ventricular rate is not optimally controlled. We discussed the treatment options and I reviewed rate vs rhythm control. He would like to try rate control for now. If he cannot control his rate or if he feels badly with a strategy of rate control, then we would consider the initiation of Tikosyn. We also discussed ablation. 3. PPM - he is s/p insertion of a DDD PM. He appears to be healing nicely. 4. CAD -  he denies anginal symptoms. Will follow.  [name].D.

## 2015-07-03 ENCOUNTER — Encounter: Payer: Medicare Other | Admitting: Internal Medicine

## 2015-07-03 ENCOUNTER — Ambulatory Visit (INDEPENDENT_AMBULATORY_CARE_PROVIDER_SITE_OTHER): Payer: Medicare Other | Admitting: Internal Medicine

## 2015-07-03 ENCOUNTER — Encounter: Payer: Self-pay | Admitting: Internal Medicine

## 2015-07-03 VITALS — BP 106/68 | HR 51 | Ht 73.0 in | Wt 225.0 lb

## 2015-07-03 DIAGNOSIS — I484 Atypical atrial flutter: Secondary | ICD-10-CM | POA: Diagnosis not present

## 2015-07-03 NOTE — Progress Notes (Signed)
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 The patient has an extensive cardiac history with long-standing hypertension and sinus node dysfunction, status post pacemaker insertion, CAD, s/p multiple interventions, PAF, and is s/p AVR for aortic stenosis with surgical MAZE. The patient had maintained sinus rhythm on amiodarone therapy. He developed thyroid dysfunction and possible pulmonary toxicity and his amio has been stopped. He has had PAF and symptomatic PVC's. He was seen by Dr. Greggory Brandy and thought not to have enough symptoms to undergo catheter ablation of atrial fib. He has developed worsening symptoms. His 81 yo PPM leads began to experience noise resulting in inhibition of pacing and he went insertion of a new DDD PM on the right side about 4 weeks ago. He has had worsening atrial fib/flutter. He gets dizzy with exertion as well as chest pressure and sob. He feels like his heart is beating too fast. He has taken additional beta blocker. Today he came in for an add on visit and was noted to have atrial flutter with an RVR. He has taken additional doses of his metoprolol and feels poorly but his rates have been up. When his rate is controlled, he feels ok but when his rate is up he feels terrible.  Allergies  Allergen Reactions  . Contrast Media [Iodinated Diagnostic Agents] Hives     Current Outpatient Prescriptions  Medication Sig Dispense Refill  . apixaban (ELIQUIS) 5 MG TABS tablet Take 5 mg by mouth 2 (two) times daily.    . bisacodyl (DULCOLAX) 5 MG EC tablet Take 5 mg by mouth daily as needed (constipation).     . butalbital-acetaminophen-caffeine (FIORICET, ESGIC) 50-325-40 MG per tablet Take 1 tablet by mouth 2 (two) times daily as needed (osteo-arthritis pain).     . Coenzyme Q10 (COQ10) 100 MG CAPS Take 100 mg by mouth daily.    . Diclofenac Sodium 1.5 % SOLN Take 1 application by mouth 2 (two) times daily as needed (Knee pain).     Marland Kitchen doxazosin  (CARDURA) 4 MG tablet Take 4 mg by mouth at bedtime.     Marland Kitchen HYDROcodone-acetaminophen (NORCO) 10-325 MG tablet Take 0.5-1 tablets by mouth 2 (two) times daily as needed for moderate pain or severe pain.   0  . LORazepam (ATIVAN) 1 MG tablet Take 1 mg by mouth at bedtime.     . magnesium oxide (MAG-OX) 400 MG tablet Take 400 mg by mouth daily.    . metoprolol succinate (TOPROL-XL) 25 MG 24 hr tablet Take 1 tablet (25 mg total) by mouth 2 (two) times daily. (Patient taking differently: Take 12.5-50 mg by mouth 2 (two) times daily. To control heart rate) 180 tablet 3  . Multiple Vitamin (MULTIVITAMIN WITH MINERALS) TABS tablet Take 1 tablet by mouth daily.    . nitroGLYCERIN (NITROSTAT) 0.4 MG SL tablet Place 1 tablet (0.4 mg total) under the tongue every 5 (five) minutes as needed for chest pain. 25 tablet 3  . OVER THE COUNTER MEDICATION Apply 1 patch topically daily as needed (neck and shoulder pain). Chinese pain patch    . potassium chloride SA (KLOR-CON M20) 20 MEQ tablet Take 1 tablet (20 mEq total) by mouth daily. 90 tablet 3  . predniSONE (DELTASONE) 5 MG tablet Take 5 mg by mouth every other day.  4  . rosuvastatin (CRESTOR) 20 MG tablet Take 1 tablet (20 mg total) by mouth at bedtime. (Patient taking differently: Take 20 mg by mouth  every other day. At night) 90 tablet 3   No current facility-administered medications for this visit.     Past Medical History  Diagnosis Date  . 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  . Coronary artery disease     status post multiple prior percutaneous coronary interventions, microvascular angina per Dr Olevia Perches  . Aortic stenosis     moderate aortic stenosis  . Hyperlipidemia   . Hypercoagulable state ([place])     chronically anticoagulated with coumadin  . Heart murmur   . Stroke ([place])     1990    . Hyperthyroidism   . Benign prostatic hypertrophy   . Arthritis   . Paroxysmal atrial fibrillation ([place])     DR. Lia Foyer,   . Hypothyroidism     Dr. Elyse Hsu  . MGUS (monoclonal gammopathy of unknown significance) 02/17/2013    ROS:   All systems reviewed and negative except as noted in the HPI.   Past Surgical History  Procedure Laterality Date  . Pacemaker insertion  1991    Guidant PPM, most recent Generator Change by Dr Olevia Perches was 08/22/06  . Hemrrhoidectomy    . Cardiac catheterization      11  . Cardioversion    . Appendectomy    . Aortic valve replacement  03/15/2011    Procedure: AORTIC VALVE REPLACEMENT (AVR);  Surgeon: Gaye Pollack, MD;  Location: [place];  Service: Open Heart Surgery;  Laterality: N/A;  . Maze  03/15/2011    Procedure: MAZE;  Surgeon: Gaye Pollack, MD;  Location: [place];  Service: Open Heart Surgery;  Laterality: N/A;  . Tee without cardioversion  04/15/2011    Procedure: TRANSESOPHAGEAL ECHOCARDIOGRAM (TEE);  Surgeon: Loralie Champagne, MD;  Location: [place];  Service: Cardiovascular;  Laterality: N/A;  . Cardioversion  04/15/2011    Procedure: CARDIOVERSION;  Surgeon: Loralie Champagne, MD;  Location: [facility] ENDOSCOPY;  Service: Cardiovascular;  Laterality: N/A;  . Left and right heart catheterization with coronary angiogram Bilateral 02/01/2011    Procedure: LEFT AND RIGHT HEART CATHETERIZATION WITH CORONARY ANGIOGRAM;  Surgeon: Hillary Bow, MD;  Location[facility] CATH LAB;  Service: Cardiovascular;  Laterality: Bilateral;  . Tee without cardioversion N/A 09/11/2014    Procedure: TRANSESOPHAGEAL ECHOCARDIOGRAM (TEE);  Surgeon: Sueanne Margarita, MD;  Location: [place];  Service: Cardiovascular;  Laterality: N/A;  . Cardioversion N/A 09/11/2014    Procedure: CARDIOVERSION;  Surgeon: Sueanne Margarita, MD;  Location: [facility] ENDOSCOPY;  Service: Cardiovascular;  Laterality: N/A;  . Ep implantable device N/A 06/16/2015    Procedure: Pacemaker Implant;  Surgeon:  Evans Lance, MD;  Location: [place] CV LAB;  Service: Cardiovascular;  Laterality: N/A;  . Cardioversion N/A 06/27/2015    Procedure: CARDIOVERSION;  Surgeon: Thayer Headings, MD;  Location: [facility] ENDOSCOPY;  Service: Cardiovascular;  Laterality: N/A;     Family History  Problem Relation Age of Onset  . Heart disease Brother     Twin brother has coronary disease and recent AVR for AS  . Anesthesia problems Neg Hx   . Hypotension Neg Hx   . Malignant hyperthermia Neg Hx   . Pseudochol deficiency Neg Hx      Social History   Social History  . Marital Status: Married    Spouse Name: N/A  . Number of Children: N/A  . Years of Education:  N/A   Occupational History  . Not on file.   Social History Main Topics  . Smoking status: Never Smoker   . Smokeless tobacco: Never Used  . Alcohol Use: 0.0 oz/week    0 Standard drinks or equivalent per week     Comment: Rarely.  . Drug Use: No  . Sexual Activity: Not on file   Other Topics Concern  . Not on file   Social History Narrative     BP 106/68 mmHg  Pulse 51  Ht 6\' 1"  (1.854 m)  Wt 225 lb (102.059 kg)  BMI 29.69 kg/m2  SpO2 96%  Physical Exam:  Chronically ill appearing 81 year old man, NAD HEENT: Unremarkable Neck:  6 cm JVD, no thyromegally Back:  No CVA tenderness Lungs:  Clear with no wheezes, rales, or rhonchi. HEART:  IRegular tachy rhythm, split S2. Abd:  soft, positive bowel sounds, no organomegally, no rebound, no guarding Ext:  2 plus pulses, no edema, no cyanosis, no clubbing Skin:  No rashes no nodules Neuro:  CN II through XII intact, motor grossly intact   DEVICE  Normal device function.  See PaceArt for details. He has had atrial fib/flutter and is in this rhythm today. His new right sided DDD PM is working normally.   Assess/Plan:  1. Atrial fib/flutter with a RVR - his ventricular rate is not optimally controlled. We had previously reprogrammed his device but because of his surgical MAZE,  he is more often in atrial flutter with an RVR than atrial fib with a controlled VR. I have attempted to get him back to NSR and atrial fib without success by ATP. He will continue his beta blocker. I will have him come in tomorrow to undergo add on DCCV. 2. Acute diastolic heart failure - he feels poorly but is not in distress. If his breathing worsens, he is instructed to come to the ED 3. PPM - he is s/p insertion of a DDD PM. He appears to be healing nicely. 4. CAD - he denies anginal symptoms except when he is exerting himself and his rate is high. This should improve once his rate is controlled. Will follow.  Mikle Bosworth.D.

## 2015-07-03 NOTE — Patient Instructions (Signed)
  Call [facility] @ [phone] Concerning Cardioversion.   If you need a refill on your cardiac medications before your next appointment, please call your pharmacy.  Thank you for choosing [facility]!

## 2015-07-04 ENCOUNTER — Encounter (HOSPITAL_COMMUNITY): Payer: Self-pay

## 2015-07-04 ENCOUNTER — Telehealth: Payer: Self-pay | Admitting: *Deleted

## 2015-07-04 ENCOUNTER — Ambulatory Visit (HOSPITAL_COMMUNITY): Payer: Medicare Other | Admitting: Anesthesiology

## 2015-07-04 ENCOUNTER — Ambulatory Visit (HOSPITAL_COMMUNITY)
Admission: RE | Admit: 2015-07-04 | Discharge: 2015-07-04 | Disposition: A | Discharge status: Home / Self Care | Payer: Medicare Other | Source: Ambulatory Visit | Attending: Internal Medicine | Admitting: Internal Medicine

## 2015-07-04 ENCOUNTER — Other Ambulatory Visit: Payer: Self-pay | Admitting: Physician Assistant

## 2015-07-04 ENCOUNTER — Encounter (HOSPITAL_COMMUNITY)
Admission: RE | Disposition: A | Discharge status: Home / Self Care | Payer: Self-pay | Source: Ambulatory Visit | Attending: Internal Medicine

## 2015-07-04 DIAGNOSIS — I480 Paroxysmal atrial fibrillation: Secondary | ICD-10-CM | POA: Insufficient documentation

## 2015-07-04 DIAGNOSIS — I4892 Unspecified atrial flutter: Secondary | ICD-10-CM | POA: Insufficient documentation

## 2015-07-04 DIAGNOSIS — Z952 Presence of prosthetic heart valve: Secondary | ICD-10-CM | POA: Insufficient documentation

## 2015-07-04 DIAGNOSIS — Z79899 Other long term (current) drug therapy: Secondary | ICD-10-CM | POA: Insufficient documentation

## 2015-07-04 DIAGNOSIS — E785 Hyperlipidemia, unspecified: Secondary | ICD-10-CM | POA: Insufficient documentation

## 2015-07-04 DIAGNOSIS — I5031 Acute diastolic (congestive) heart failure: Secondary | ICD-10-CM | POA: Diagnosis not present

## 2015-07-04 DIAGNOSIS — I2510 Atherosclerotic heart disease of native coronary artery without angina pectoris: Secondary | ICD-10-CM | POA: Diagnosis not present

## 2015-07-04 DIAGNOSIS — I484 Atypical atrial flutter: Secondary | ICD-10-CM | POA: Diagnosis not present

## 2015-07-04 DIAGNOSIS — Z8673 Personal history of transient ischemic attack (TIA), and cerebral infarction without residual deficits: Secondary | ICD-10-CM | POA: Insufficient documentation

## 2015-07-04 DIAGNOSIS — I110 Hypertensive heart disease with heart failure: Secondary | ICD-10-CM | POA: Insufficient documentation

## 2015-07-04 DIAGNOSIS — Z950 Presence of cardiac pacemaker: Secondary | ICD-10-CM | POA: Insufficient documentation

## 2015-07-04 DIAGNOSIS — E039 Hypothyroidism, unspecified: Secondary | ICD-10-CM | POA: Diagnosis not present

## 2015-07-04 DIAGNOSIS — I4891 Unspecified atrial fibrillation: Secondary | ICD-10-CM | POA: Diagnosis not present

## 2015-07-04 DIAGNOSIS — Z91041 Radiographic dye allergy status: Secondary | ICD-10-CM | POA: Diagnosis not present

## 2015-07-04 DIAGNOSIS — Z7901 Long term (current) use of anticoagulants: Secondary | ICD-10-CM | POA: Insufficient documentation

## 2015-07-04 HISTORY — PX: CARDIOVERSION: SHX1299

## 2015-07-04 SURGERY — CARDIOVERSION
Anesthesia: General

## 2015-07-04 MED ORDER — PROPOFOL 10 MG/ML IV BOLUS
INTRAVENOUS | Status: DC | PRN
  Administered 2015-07-04: 90 mg via INTRAVENOUS

## 2015-07-04 MED ORDER — SODIUM CHLORIDE 0.9 % IV SOLN
INTRAVENOUS | Status: DC
Start: 2015-07-04 — End: 2015-07-04
  Administered 2015-07-04: 13:00:00 via INTRAVENOUS
  Administered 2015-07-04: 500 mL via INTRAVENOUS

## 2015-07-04 MED ORDER — HYDROCORTISONE 1 % EX CREA: 1.0000 "application " | TOPICAL_CREAM | Freq: Three times a day (TID) | CUTANEOUS | Status: DC | PRN

## 2015-07-04 MED ORDER — SODIUM CHLORIDE 0.9 % IV SOLN: 250.0000 mL | INTRAVENOUS | Status: DC

## 2015-07-04 MED ORDER — SODIUM CHLORIDE 0.9% FLUSH: 3.0000 mL | Freq: Two times a day (BID) | INTRAVENOUS | Status: DC

## 2015-07-04 MED ORDER — METOPROLOL SUCCINATE ER 100 MG PO TB24: 100.0000 mg | ORAL_TABLET | Freq: Two times a day (BID) | ORAL | Status: DC

## 2015-07-04 MED ORDER — ROSUVASTATIN CALCIUM 20 MG PO TABS: 20.0000 mg | ORAL_TABLET | ORAL | Status: DC

## 2015-07-04 MED ORDER — SODIUM CHLORIDE 0.9% FLUSH: 3.0000 mL | INTRAVENOUS | Status: DC | PRN

## 2015-07-04 MED FILL — METOPROLOL SUCC ER 100 MG T: 100 | 90 days supply | Qty: 180 | Fill #0

## 2015-07-04 NOTE — Transfer of Care (Signed)
Immediate Anesthesia Transfer of Care Note  Patient: [name]  Procedure(s) Performed: Procedure(s): CARDIOVERSION (N/A)  Patient Location: PACU  Anesthesia Type:MAC  Level of Consciousness: awake and alert   Airway & Oxygen Therapy: Patient Spontanous Breathing  Post-op Assessment: Report given to RN and Post -op Vital signs reviewed and stable  Post vital signs: Reviewed and stable  Last Vitals:  Filed Vitals:   07/04/15 1208  BP: 180/117  Pulse: 82  Temp: 36.6 C  Resp: 15    Last Pain: There were no vitals filed for this visit.       Complications: No apparent anesthesia complications

## 2015-07-04 NOTE — Telephone Encounter (Signed)
Called patient to determine if he still wants to pursue DCCV today.  He states that his HR is controlled in the 70s-80s right now and that he feels much better.  Patient has not eaten yet this morning and took 50mg  metoprolol.  He states he is going to walk around his house and get ready for the day to see if his HR is still controlled.  Patient to call back in the next few hours after he decides.  Gave device clinic phone number for return call.

## 2015-07-04 NOTE — Telephone Encounter (Signed)
Pt called wanting you to call him back.

## 2015-07-04 NOTE — Discharge Instructions (Signed)
Electrical Cardioversion, Care After °Refer to this sheet in the next few weeks. These instructions provide you with information on caring for yourself after your procedure. Your health care provider may also give you more specific instructions. Your treatment has been planned according to current medical practices, but problems sometimes occur. Call your health care provider if you have any problems or questions after your procedure. °WHAT TO EXPECT AFTER THE PROCEDURE °After your procedure, it is typical to have the following sensations: °· Some redness on the skin where the shocks were delivered. If this is tender, a sunburn lotion or hydrocortisone cream may help. °· Possible return of an abnormal heart rhythm within hours or days after the procedure. °HOME CARE INSTRUCTIONS °· Take medicines only as directed by your health care provider. Be sure you understand how and when to take your medicine. °· Learn how to feel your pulse and check it often. °· Limit your activity for 48 hours after the procedure or as directed by your health care provider. °· Avoid or minimize caffeine and other stimulants as directed by your health care provider. °SEEK MEDICAL CARE IF: °· You feel like your heart is beating too fast or your pulse is not regular. °· You have any questions about your medicines. °· You have bleeding that will not stop. °SEEK IMMEDIATE MEDICAL CARE IF: °· You are dizzy or feel faint. °· It is hard to breathe or you feel short of breath. °· There is a change in discomfort in your chest. °· Your speech is slurred or you have trouble moving an arm or leg on one side of your body. °· You get a serious muscle cramp that does not go away. °· Your fingers or toes turn cold or blue. °  °This information is not intended to replace advice given to you by your health care provider. Make sure you discuss any questions you have with your health care provider. °  °Document Released: 12/06/2012 Document Revised: 03/08/2014  Document Reviewed: 12/06/2012 °Elsevier Interactive Patient Education ©2016 [employer] Inc[employer] ° °

## 2015-07-04 NOTE — H&P (Signed)
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 The patient has an extensive cardiac history with long-standing hypertension and sinus node dysfunction, status post pacemaker insertion, CAD, s/p multiple interventions, PAF, and is s/p AVR for aortic stenosis with surgical MAZE. The patient had maintained sinus rhythm on amiodarone therapy. He developed thyroid dysfunction and possible pulmonary toxicity and his amio has been stopped. He has had PAF and symptomatic PVC's. He was seen by Dr. Greggory Brandy and thought not to have enough symptoms to undergo catheter ablation of atrial fib. He has developed worsening symptoms. His 81 yo PPM leads began to experience noise resulting in inhibition of pacing and he went insertion of a new DDD PM on the right side about 4 weeks ago. He has had worsening atrial fib/flutter. He gets dizzy with exertion as well as chest pressure and sob. He feels like his heart is beating too fast. He has taken additional beta blocker. Today he came in for an add on visit and was noted to have atrial flutter with an RVR. He has taken additional doses of his metoprolol and feels poorly but his rates have been up. When his rate is controlled, he feels ok but when his rate is up he feels terrible.  Allergies  Allergen Reactions  . Contrast Media [Iodinated Diagnostic Agents] Hives     Current Outpatient Prescriptions  Medication Sig Dispense Refill  . apixaban (ELIQUIS) 5 MG TABS tablet Take 5 mg by mouth 2 (two) times daily.    . bisacodyl (DULCOLAX) 5 MG EC tablet Take 5 mg by mouth daily as needed (constipation).     . butalbital-acetaminophen-caffeine (FIORICET, ESGIC) 50-325-40 MG per tablet Take 1 tablet by mouth 2 (two) times daily as needed (osteo-arthritis pain).     . Coenzyme Q10 (COQ10) 100 MG CAPS Take 100 mg by mouth daily.    . Diclofenac Sodium 1.5 % SOLN Take 1 application by mouth 2 (two) times daily as needed  (Knee pain).     Marland Kitchen doxazosin (CARDURA) 4 MG tablet Take 4 mg by mouth at bedtime.     Marland Kitchen HYDROcodone-acetaminophen (NORCO) 10-325 MG tablet Take 0.5-1 tablets by mouth 2 (two) times daily as needed for moderate pain or severe pain.   0  . LORazepam (ATIVAN) 1 MG tablet Take 1 mg by mouth at bedtime.     . magnesium oxide (MAG-OX) 400 MG tablet Take 400 mg by mouth daily.    . metoprolol succinate (TOPROL-XL) 25 MG 24 hr tablet Take 1 tablet (25 mg total) by mouth 2 (two) times daily. (Patient taking differently: Take 12.5-50 mg by mouth 2 (two) times daily. To control heart rate) 180 tablet 3  . Multiple Vitamin (MULTIVITAMIN WITH MINERALS) TABS tablet Take 1 tablet by mouth daily.    . nitroGLYCERIN (NITROSTAT) 0.4 MG SL tablet Place 1 tablet (0.4 mg total) under the tongue every 5 (five) minutes as needed for chest pain. 25 tablet 3  . OVER THE COUNTER MEDICATION Apply 1 patch topically daily as needed (neck and shoulder pain). Chinese pain patch    . potassium chloride SA (KLOR-CON M20) 20 MEQ tablet Take 1 tablet (20 mEq total) by mouth daily. 90 tablet 3  . predniSONE (DELTASONE) 5 MG tablet Take 5 mg by mouth every other day.  4  . rosuvastatin (CRESTOR) 20 MG tablet Take 1 tablet (20 mg total) by mouth at bedtime. (Patient taking differently: Take 20 mg by mouth every other day. At  night) 90 tablet 3   No current facility-administered medications for this visit.     Past Medical History  Diagnosis Date  . Chronotropic incompetence with sinus node dysfunction (HCC)     Status post Guidant dual-mode, dual-pacing, dual-sensing pacemaker implantation now programmed to AAI with recent generator change.  . Coronary artery disease     status post multiple prior percutaneous coronary interventions, microvascular angina per Dr Olevia Perches  . Aortic stenosis     moderate aortic stenosis  . Hyperlipidemia   .  Hypercoagulable state ([place])     chronically anticoagulated with coumadin  . Heart murmur   . Stroke ([place])     1990  . Hyperthyroidism   . Benign prostatic hypertrophy   . Arthritis   . Paroxysmal atrial fibrillation ([place])     DR. Lia Foyer,   . Hypothyroidism     Dr. Elyse Hsu  . MGUS (monoclonal gammopathy of unknown significance) 02/17/2013    ROS:  All systems reviewed and negative except as noted in the HPI.   Past Surgical History  Procedure Laterality Date  . Pacemaker insertion  1991    Guidant PPM, most recent Generator Change by Dr Olevia Perches was 08/22/06  . Hemrrhoidectomy    . Cardiac catheterization      11  . Cardioversion    . Appendectomy    . Aortic valve replacement  03/15/2011    Procedure: AORTIC VALVE REPLACEMENT (AVR); Surgeon: Gaye Pollack, MD; Location: [place]; Service: Open Heart Surgery; Laterality: N/A;  . Maze  03/15/2011    Procedure: MAZE; Surgeon: Gaye Pollack, MD; Location: [place]; Service: Open Heart Surgery; Laterality: N/A;  . Tee without cardioversion  04/15/2011    Procedure: TRANSESOPHAGEAL ECHOCARDIOGRAM (TEE); Surgeon: Loralie Champagne, MD; Location: [place]; Service: Cardiovascular; Laterality: N/A;  . Cardioversion  04/15/2011    Procedure: CARDIOVERSION; Surgeon: Loralie Champagne, MD; Location: [facility] ENDOSCOPY; Service: Cardiovascular; Laterality: N/A;  . Left and right heart catheterization with coronary angiogram Bilateral 02/01/2011    Procedure: LEFT AND RIGHT HEART CATHETERIZATION WITH CORONARY ANGIOGRAM; Surgeon: Hillary Bow, MD; Location: [facility] CATH LAB; Service: Cardiovascular; Laterality: Bilateral;  . Tee without cardioversion N/A 09/11/2014    Procedure: TRANSESOPHAGEAL ECHOCARDIOGRAM (TEE); Surgeon: Sueanne Margarita, MD; Location: [place]; Service: Cardiovascular; Laterality: N/A;  .  Cardioversion N/A 09/11/2014    Procedure: CARDIOVERSION; Surgeon: Sueanne Margarita, MD; Location: [facility] ENDOSCOPY; Service: Cardiovascular; Laterality: N/A;  . Ep implantable device N/A 06/16/2015    Procedure: Pacemaker Implant; Surgeon: Evans Lance, MD; Location: [place] CV LAB; Service: Cardiovascular; Laterality: N/A;  . Cardioversion N/A 06/27/2015    Procedure: CARDIOVERSION; Surgeon: Thayer Headings, MD; Location: [facility] ENDOSCOPY; Service: Cardiovascular; Laterality: N/A;     Family History  Problem Relation Age of Onset  . Heart disease Brother     Twin brother has coronary disease and recent AVR for AS  . Anesthesia problems Neg Hx   . Hypotension Neg Hx   . Malignant hyperthermia Neg Hx   . Pseudochol deficiency Neg Hx      Social History   Social History  . Marital Status: Married    Spouse Name: N/A  . Number of Children: N/A  . Years of Education: N/A   Occupational History  . Not on file.   Social History Main Topics  . Smoking status: Never Smoker   . Smokeless tobacco: Never Used  . Alcohol Use: 0.0 oz/week    0 Standard drinks or equivalent per week  Comment: Rarely.  . Drug Use: No  . Sexual Activity: Not on file   Other Topics Concern  . Not on file   Social History Narrative     BP 106/68 mmHg  Pulse 51  Ht 6\' 1"  (1.854 m)  Wt 225 lb (102.059 kg)  BMI 29.69 kg/m2  SpO2 96%  Physical Exam:  Chronically ill appearing 81 year old man, NAD HEENT: Unremarkable Neck: 6 cm JVD, no thyromegally Back: No CVA tenderness Lungs: Clear with no wheezes, rales, or rhonchi. HEART: IRegular tachy rhythm, split S2. Abd: soft, positive bowel sounds, no organomegally, no rebound, no guarding Ext: 2 plus pulses, no edema, no cyanosis, no clubbing Skin: No rashes no nodules Neuro: CN II through XII intact, motor grossly  intact   DEVICE  Normal device function. See PaceArt for details. He has had atrial fib/flutter and is in this rhythm today. His new right sided DDD PM is working normally.   Assess/Plan:  1. Atrial fib/flutter with a RVR - his ventricular rate is not optimally controlled. We had previously reprogrammed his device but because of his surgical MAZE, he is more often in atrial flutter with an RVR than atrial fib with a controlled VR. I have attempted to get him back to NSR and atrial fib without success by ATP. He will continue his beta blocker. I will have him come in tomorrow to undergo add on DCCV. 2. Acute diastolic heart failure - he feels poorly but is not in distress. If his breathing worsens, he is instructed to come to the ED 3. PPM - he is s/p insertion of a DDD PM. He appears to be healing nicely. 4. CAD - he denies anginal symptoms except when he is exerting himself and his rate is high. This should improve once his rate is controlled. Will follow.  Carleene Overlie Demeshia Sherburne,M.D.       EP Since clinic visit yesterday, he remains in atrial flutter with an RVR. Will proceed with DCCV. He has not missed any anti-coagulation.  Mikle Bosworth.D.

## 2015-07-04 NOTE — Anesthesia Preprocedure Evaluation (Signed)
Anesthesia Evaluation  Patient identified by MRN, date of birth, ID band Patient awake    Reviewed: Allergy & Precautions, NPO status , Patient's Chart, lab work & pertinent test results  Airway Mallampati: II  TM Distance: >3 FB Neck ROM: Full    Dental   Pulmonary    Pulmonary exam normal        Cardiovascular + angina + CAD  Normal cardiovascular exam+ dysrhythmias Atrial Fibrillation + pacemaker + Valvular Problems/Murmurs      Neuro/Psych CVA    GI/Hepatic   Endo/Other    Renal/GU      Musculoskeletal   Abdominal   Peds  Hematology   Anesthesia Other Findings   Reproductive/Obstetrics                             Anesthesia Physical Anesthesia Plan  ASA: III  Anesthesia Plan: General   Post-op Pain Management:    Induction: Intravenous  Airway Management Planned: Mask  Additional Equipment:   Intra-op Plan:   Post-operative Plan:   Informed Consent: I have reviewed the patients History and Physical, chart, labs and discussed the procedure including the risks, benefits and alternatives for the proposed anesthesia with the patient or authorized representative who has indicated his/her understanding and acceptance.     Plan Discussed with: CRNA and Surgeon  Anesthesia Plan Comments:         Anesthesia Quick Evaluation

## 2015-07-04 NOTE — Anesthesia Postprocedure Evaluation (Signed)
Anesthesia Post Note  Patient: [name]  Procedure(s) Performed: Procedure(s) (LRB): CARDIOVERSION (N/A)  Patient location during evaluation: PACU Anesthesia Type: General Level of consciousness: awake and alert Pain management: pain level controlled Vital Signs Assessment: post-procedure vital signs reviewed and stable Respiratory status: spontaneous breathing, nonlabored ventilation, respiratory function stable and patient connected to nasal cannula oxygen Cardiovascular status: blood pressure returned to baseline and stable Postop Assessment: no signs of nausea or vomiting Anesthetic complications: no    Last Vitals:  Filed Vitals:   07/04/15 1400 07/04/15 1405  BP: 123/67 143/92  Pulse:    Temp:    Resp: 14 17    Last Pain: There were no vitals filed for this visit.               [place]

## 2015-07-04 NOTE — CV Procedure (Signed)
EP procedure note  Procedure performed: Elective DC cardioversion  Preoperative diagnosis: Incessant left atrial flutter  Postoperative diagnosis: Same as preoperative diagnosis  Description of the procedure: After informed consent was obtained, the patient was sedated under the direction of the anesthesia service with intravenous propofol. The electrode dispersive pad was placed in the anterior-posterior position. 200 J of synchronized biphasic energy was delivered, terminating atrial flutter, and restoring sinus rhythm. The patient was atrial pacing after the procedure.  Complications: There were no immediate procedure, occasions  Conclusion: Successful DC cardioversion in a patient with incessant left atrial flutter.  Cristopher Peru, M.D.

## 2015-07-04 NOTE — Telephone Encounter (Signed)
Called patient--he still wishes to pursue DCCV today.  Dr. Lovena Le made aware, procedure already scheduled for 2:00pm.  Patient currently at [facility].  Tommye Standard, PA made aware of plan and will enter orders.  Patient is appreciative of assistance and denies additional questions at this time.

## 2015-07-07 ENCOUNTER — Encounter (HOSPITAL_COMMUNITY): Payer: Self-pay | Admitting: Internal Medicine

## 2015-07-21 LAB — CUP PACEART INCLINIC DEVICE CHECK
Brady Statistic RV Percent Paced: 1 % — CL
Date Time Interrogation Session: 20170522152500
Implantable Lead Implant Date: 20170417
Implantable Lead Location: 753860
Implantable Lead Model: 7741
Lead Channel Setting Pacing Amplitude: 3.5 V
Lead Channel Setting Sensing Sensitivity: 2.5 mV
MDC IDC LEAD IMPLANT DT: 20170417
MDC IDC LEAD LOCATION: 753859
MDC IDC LEAD SERIAL: 662696
MDC IDC LEAD SERIAL: 751382
MDC IDC SET LEADCHNL RV PACING AMPLITUDE: 3.5 V
MDC IDC SET LEADCHNL RV PACING PULSEWIDTH: 0.4 ms
MDC IDC STAT BRADY RA PERCENT PACED: 1 % — AB
Pulse Gen Serial Number: 718418

## 2015-07-21 MED FILL — DOXAZOSIN MESYLATE 4 MG TAB: 4 | 30 days supply | Qty: 30 | Fill #2

## 2015-07-21 MED FILL — ELIQUIS 5 MG TABLET: 5 | 30 days supply | Qty: 60 | Fill #8

## 2015-07-21 MED FILL — BUTALB-ACETAMIN-CAFF 50-325: 50-325-40 | 30 days supply | Qty: 60 | Fill #2

## 2015-07-22 ENCOUNTER — Telehealth: Payer: Self-pay | Admitting: Internal Medicine

## 2015-07-22 NOTE — Telephone Encounter (Signed)
New Message: ° ° ° ° °Please call. °

## 2015-07-23 ENCOUNTER — Encounter: Payer: Self-pay | Admitting: Internal Medicine

## 2015-07-23 ENCOUNTER — Ambulatory Visit (INDEPENDENT_AMBULATORY_CARE_PROVIDER_SITE_OTHER): Payer: Medicare Other | Admitting: Internal Medicine

## 2015-07-23 VITALS — BP 108/56 | HR 68 | Ht 73.0 in

## 2015-07-23 DIAGNOSIS — I491 Atrial premature depolarization: Secondary | ICD-10-CM

## 2015-07-23 DIAGNOSIS — Z950 Presence of cardiac pacemaker: Secondary | ICD-10-CM

## 2015-07-23 NOTE — Telephone Encounter (Signed)
Left message on home number.  Then called cell number.  Spoke to patient, he has no power.  Tree feel on generator.  He was calling because he feels his HR is 30-40 and in bigeminy.  Feels terrible.  Wants to know about his Metoprolol dosing.  He device was reprogrammed top DDI 50. Currently taking Metoprolol 100 mg bid.  I let him know I would discuss wit Dr Lovena Le and call him back

## 2015-07-23 NOTE — Telephone Encounter (Signed)
Discussed with Dr Lovena Le.  Will have him come in today at 1:45 to see Dr Lovena Le for device interrogation.  Patient aware

## 2015-07-23 NOTE — Progress Notes (Signed)
HPI Dr. 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 The patient has an extensive cardiac history with long-standing hypertension and sinus node dysfunction, status post pacemaker insertion, CAD, s/p multiple interventions, PAF, and is s/p AVR for aortic stenosis with surgical MAZE. The patient had maintained sinus rhythm on amiodarone therapy. He developed thyroid dysfunction and possible pulmonary toxicity and Kevin amio has been stopped. He has had PAF and symptomatic PVC's. He was seen by Dr. Greggory Brandy and thought not to have enough symptoms to undergo catheter ablation of atrial fib. He has developed worsening symptoms. Kevin 81 yo PPM leads began to experience noise resulting in inhibition of pacing and he went insertion of a new DDD PM on the right side about 8 weeks ago. He has had worsening atrial fib/flutter. He was cardioverted on May 4 and has maintained (to my surprise) NSR. This morning he awoke and Kevin pulse ox revealed a HR in the 40's and he returns for additional eval.  Allergies  Allergen Reactions  . Contrast Media [Iodinated Diagnostic Agents] Hives     Current Outpatient Prescriptions  Medication Sig Dispense Refill  . apixaban (ELIQUIS) 5 MG TABS tablet Take 5 mg by mouth 2 (two) times daily.    . bisacodyl (DULCOLAX) 5 MG EC tablet Take 5 mg by mouth daily as needed (constipation).     . butalbital-acetaminophen-caffeine (FIORICET, ESGIC) 50-325-40 MG per tablet Take 1 tablet by mouth 2 (two) times daily as needed (osteo-arthritis pain).     . Coenzyme Q10 (COQ10) 100 MG CAPS Take 100 mg by mouth daily.    . Diclofenac Sodium 1.5 % SOLN Take 1 application by mouth 2 (two) times daily as needed (Knee pain).     Marland Kitchen doxazosin (CARDURA) 4 MG tablet Take 4 mg by mouth at bedtime.     Marland Kitchen HYDROcodone-acetaminophen (NORCO) 10-325 MG tablet Take 0.5-1 tablets by mouth 2 (two) times daily as needed for moderate pain or severe pain.   0  . LORazepam  (ATIVAN) 1 MG tablet Take 1 mg by mouth at bedtime.     . magnesium oxide (MAG-OX) 400 MG tablet Take 400 mg by mouth daily.    . metoprolol succinate (TOPROL-XL) 100 MG 24 hr tablet Take 1 tablet (100 mg total) by mouth 2 (two) times daily. 180 tablet 1  . Multiple Vitamin (MULTIVITAMIN WITH MINERALS) TABS tablet Take 1 tablet by mouth daily.    . nitroGLYCERIN (NITROSTAT) 0.4 MG SL tablet Place 1 tablet (0.4 mg total) under the tongue every 5 (five) minutes as needed for chest pain. 25 tablet 3  . OVER THE COUNTER MEDICATION Apply 1 patch topically daily as needed (neck and shoulder pain). Chinese pain patch    . potassium chloride SA (KLOR-CON M20) 20 MEQ tablet Take 1 tablet (20 mEq total) by mouth daily. 90 tablet 3  . predniSONE (DELTASONE) 5 MG tablet Take 5 mg by mouth every other day.  4  . rosuvastatin (CRESTOR) 20 MG tablet Take 1 tablet (20 mg total) by mouth every other day. At night 90 tablet 3   No current facility-administered medications for this visit.     Past Medical History  Diagnosis Date  . 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  . Coronary artery disease     status post multiple  prior percutaneous coronary interventions, microvascular angina per Dr Olevia Perches  . Aortic stenosis     moderate aortic stenosis  . Hyperlipidemia   . Hypercoagulable state ([place])     chronically anticoagulated with coumadin  . Heart murmur   . Stroke ([place])     1990  . Hyperthyroidism   . Benign prostatic hypertrophy   . Arthritis   . Paroxysmal atrial fibrillation ([place])     DR. Lia Foyer,   . Hypothyroidism     Dr. Elyse Hsu  . MGUS (monoclonal gammopathy of unknown significance) 02/17/2013    ROS:   All systems reviewed and negative except as noted in the HPI.   Past Surgical History  Procedure Laterality Date  .  Pacemaker insertion  1991    Guidant PPM, most recent Generator Change by Dr Olevia Perches was 08/22/06  . Hemrrhoidectomy    . Cardiac catheterization      11  . Cardioversion    . Appendectomy    . Aortic valve replacement  03/15/2011    Procedure: AORTIC VALVE REPLACEMENT (AVR);  Surgeon: Gaye Pollack, MD;  Location: [facility];  Service: Open Heart Surgery;  Laterality: N/A;  . Maze  03/15/2011    Procedure: MAZE;  Surgeon: Gaye Pollack, MD;  Location: [place];  Service: Open Heart Surgery;  Laterality: N/A;  . Tee without cardioversion  04/15/2011    Procedure: TRANSESOPHAGEAL ECHOCARDIOGRAM (TEE);  Surgeon: Loralie Champagne, MD;  Location: [place];  Service: Cardiovascular;  Laterality: N/A;  . Cardioversion  04/15/2011    Procedure: CARDIOVERSION;  Surgeon: Loralie Champagne, MD;  Location: [facility] ENDOSCOPY;  Service: Cardiovascular;  Laterality: N/A;  . Left and right heart catheterization with coronary angiogram Bilateral 02/01/2011    Procedure: LEFT AND RIGHT HEART CATHETERIZATION WITH CORONARY ANGIOGRAM;  Surgeon: Hillary Bow, MD;  Location: [facility] CATH LAB;  Service: Cardiovascular;  Laterality: Bilateral;  . Tee without cardioversion N/A 09/11/2014    Procedure: TRANSESOPHAGEAL ECHOCARDIOGRAM (TEE);  Surgeon: Sueanne Margarita, MD;  Location: [place];  Service: Cardiovascular;  Laterality: N/A;  . Cardioversion N/A 09/11/2014    Procedure: CARDIOVERSION;  Surgeon: Sueanne Margarita, MD;  Location: [facility] ENDOSCOPY;  Service: Cardiovascular;  Laterality: N/A;  . Ep implantable device N/A 06/16/2015    Procedure: Pacemaker Implant;  Surgeon: Evans Lance, MD;  Location: [place] CV LAB;  Service: Cardiovascular;  Laterality: N/A;  . Cardioversion N/A 06/27/2015    Procedure: CARDIOVERSION;  Surgeon: Thayer Headings, MD;  Location: [place];  Service: Cardiovascular;  Laterality: N/A;  . Cardioversion N/A 07/04/2015    Procedure: CARDIOVERSION;  Surgeon: Evans Lance, MD;  Location: [facility] ENDOSCOPY;   Service: Cardiovascular;  Laterality: N/A;     Family History  Problem Relation Age of Onset  . Heart disease Brother     Twin brother has coronary disease and recent AVR for AS  . Anesthesia problems Neg Hx   . Hypotension Neg Hx   . Malignant hyperthermia Neg Hx   . Pseudochol deficiency Neg Hx      Social History   Social History  . Marital Status: Married    Spouse Name: N/A  . Number of Children: N/A  . Years of Education: N/A   Occupational History  . Not on file.   Social History Main Topics  . Smoking status: Never Smoker   . Smokeless tobacco: Never Used  . Alcohol Use: 0.0 oz/week    0 Standard drinks or equivalent per week  Comment: Rarely.  . Drug Use: No  . Sexual Activity: Not on file   Other Topics Concern  . Not on file   Social History Narrative     BP 108/56 mmHg  Pulse 68  Ht 6\' 1"  (1.854 m)  Physical Exam:  Chronically ill appearing 81 year old man, NAD HEENT: Unremarkable Neck:  6 cm JVD, no thyromegally Back:  No CVA tenderness Lungs:  Clear with no wheezes, rales, or rhonchi. HEART:  IRegular rhythm, split S2. Abd:  soft, positive bowel sounds, no organomegally, no rebound, no guarding Ext:  2 plus pulses, no edema, no cyanosis, no clubbing Skin:  No rashes no nodules Neuro:  CN II through XII intact, motor grossly intact   DEVICE  Normal device function.  See PaceArt for details. He has had atrial fib/flutter and is in this rhythm today. Kevin new right sided DDD PM is working normally.   Assess/Plan:  1. Atrial fib/flutter/Pac's - he is in atrial bigeminy today. We turned on rate response on Kevin PPM and he has been able to walk reasonably well in our halls. At this point, we will continue Kevin current treatment as we do not have much else we can offer short of Tikosyn which he has not wanted to undergo. 2. Acute diastolic heart failure - Kevin symptoms are well compensated 3. PPM - he is s/p insertion of a DDD PM. He appears to  be healing nicely. 4. CAD - he denies anginal symptoms except when he is exerting himself and Kevin rate is high. These have been controlled since he has been back in rhythm. Will follow.  Mikle [place].D.

## 2015-07-23 NOTE — Patient Instructions (Signed)
Medication Instructions:  Your physician recommends that you continue on your current medications as directed. Please refer to the Current Medication list given to you today.   Labwork: none  Testing/Procedures: none  Follow-Up: Keep scheduled follow-up with Dr. Lovena Le in June.  Any Other Special Instructions Will Be Listed Below (If Applicable).     If you need a refill on your cardiac medications before your next appointment, please call your pharmacy.

## 2015-07-24 LAB — CUP PACEART INCLINIC DEVICE CHECK
Date Time Interrogation Session: 20170524040000
Implantable Lead Implant Date: 20170417
Implantable Lead Location: 753860
Implantable Lead Model: 7740
Implantable Lead Serial Number: 662696
Implantable Lead Serial Number: 751382
Lead Channel Setting Pacing Pulse Width: 0.4 ms
Lead Channel Setting Sensing Sensitivity: 2.5 mV
MDC IDC LEAD IMPLANT DT: 20170417
MDC IDC LEAD LOCATION: 753859
MDC IDC PG SERIAL: 718418
MDC IDC SET LEADCHNL RA PACING AMPLITUDE: 3.5 V
MDC IDC SET LEADCHNL RV PACING AMPLITUDE: 3.5 V

## 2015-08-01 ENCOUNTER — Other Ambulatory Visit: Payer: Self-pay

## 2015-08-01 MED ORDER — FUROSEMIDE 20 MG PO TABS: 20.0000 mg | ORAL_TABLET | Freq: Every day | ORAL | Status: DC

## 2015-08-01 NOTE — Progress Notes (Signed)
The pt walked into the office to have DMV form completed by Dr Burt Knack. Dr Burt Knack spoke with the pt and the pt does not feel well and complains of fatigue and SOB with exertion.  Follow-up appointment scheduled 08/12/15 since the pt is out of town next week.  Dr Burt Knack also recommended an echocardiogram be performed and Rx given for Furosemide 20mg  take one tablet by mouth daily.

## 2015-08-06 ENCOUNTER — Other Ambulatory Visit: Payer: Self-pay

## 2015-08-06 DIAGNOSIS — R0602 Shortness of breath: Secondary | ICD-10-CM

## 2015-08-06 DIAGNOSIS — Z952 Presence of prosthetic heart valve: Secondary | ICD-10-CM

## 2015-08-06 DIAGNOSIS — R5383 Other fatigue: Secondary | ICD-10-CM

## 2015-08-11 ENCOUNTER — Ambulatory Visit (HOSPITAL_COMMUNITY): Payer: Medicare Other | Attending: Cardiovascular Disease

## 2015-08-11 ENCOUNTER — Other Ambulatory Visit: Payer: Self-pay

## 2015-08-11 DIAGNOSIS — E785 Hyperlipidemia, unspecified: Secondary | ICD-10-CM | POA: Diagnosis not present

## 2015-08-11 DIAGNOSIS — Z954 Presence of other heart-valve replacement: Secondary | ICD-10-CM

## 2015-08-11 DIAGNOSIS — I340 Nonrheumatic mitral (valve) insufficiency: Secondary | ICD-10-CM | POA: Insufficient documentation

## 2015-08-11 DIAGNOSIS — R5383 Other fatigue: Secondary | ICD-10-CM

## 2015-08-11 DIAGNOSIS — I350 Nonrheumatic aortic (valve) stenosis: Secondary | ICD-10-CM | POA: Diagnosis present

## 2015-08-11 DIAGNOSIS — R0602 Shortness of breath: Secondary | ICD-10-CM

## 2015-08-11 DIAGNOSIS — I517 Cardiomegaly: Secondary | ICD-10-CM | POA: Insufficient documentation

## 2015-08-11 DIAGNOSIS — I2510 Atherosclerotic heart disease of native coronary artery without angina pectoris: Secondary | ICD-10-CM | POA: Insufficient documentation

## 2015-08-11 DIAGNOSIS — Z953 Presence of xenogenic heart valve: Secondary | ICD-10-CM | POA: Diagnosis not present

## 2015-08-11 DIAGNOSIS — Z952 Presence of prosthetic heart valve: Secondary | ICD-10-CM

## 2015-08-11 LAB — ECHOCARDIOGRAM COMPLETE
AO mean calculated velocity dopler: 154 cm/s
AOASC: 41 cm
AOPV: 0.29 m/s
AOVTI: 71.5 cm
AV Area VTI index: 0.65 cm2/m2
AV Area VTI: 1.44 cm2
AV Area mean vel: 1.65 cm2
AV VEL mean LVOT/AV: 0.34
AV vel: 1.48
AVA: 1.48 cm2
AVAREAMEANVIN: 0.73 cm2/m2
AVG: 12 mmHg
AVLVOTPG: 2 mmHg
AVPG: 27 mmHg
AVPKVEL: 261 cm/s
CHL CUP AV PEAK INDEX: 0.64
CHL CUP DOP CALC LVOT VTI: 21.6 cm
CHL CUP MV DEC (S): 433
E decel time: 433 msec
FS: 28 % (ref 28–44)
IVS/LV PW RATIO, ED: 0.99
LA ID, A-P, ES: 54 mm
LA diam end sys: 54 mm
LA vol index: 43.8 mL/m2
LADIAMINDEX: 2.39 cm/m2
LAVOL: 99 mL
LAVOLA4C: 97 mL
LV PW d: 11.3 mm — AB (ref 0.6–1.1)
LVOT SV: 106 mL
LVOT area: 4.91 cm2
LVOT peak VTI: 0.3 cm
LVOT peak vel: 76.7 cm/s
LVOTD: 25 mm
MVPG: 3 mmHg
MVPKAVEL: 31.6 m/s
MVPKEVEL: 88.4 m/s
Valve area index: 0.65

## 2015-08-12 ENCOUNTER — Encounter: Payer: Self-pay | Admitting: Cardiovascular Disease

## 2015-08-12 ENCOUNTER — Ambulatory Visit (INDEPENDENT_AMBULATORY_CARE_PROVIDER_SITE_OTHER): Payer: Medicare Other | Admitting: Cardiovascular Disease

## 2015-08-12 VITALS — BP 114/60 | HR 60 | Ht 73.0 in | Wt 222.5 lb

## 2015-08-12 DIAGNOSIS — I5032 Chronic diastolic (congestive) heart failure: Secondary | ICD-10-CM

## 2015-08-12 MED FILL — FUROSEMIDE 20 MG TABLET: 20 | 90 days supply | Qty: 90 | Fill #0

## 2015-08-12 NOTE — Patient Instructions (Signed)
Medication Instructions:  Your physician recommends that you continue on your current medications as directed. Please refer to the Current Medication list given to you today.  Labwork: none  Testing/Procedures: none  Follow-Up: Your physician wants you to follow-up in: 1 year with Dr Burt Knack. (June 2018).  You will receive a reminder letter in the mail two months in advance. If you don't receive a letter, please call our office to schedule the follow-up appointment.        If you need a refill on your cardiac medications before your next appointment, please call your pharmacy.

## 2015-08-12 NOTE — Progress Notes (Signed)
Cardiology Office Note Date:  08/12/2015   ID:  TADASHI SHERBURNE, DOB December 11, 1934, MRN [id number]  PCP:  Limmie Patricia, MD  Cardiologist:  Sherren Mocha, MD    No chief complaint on file.    History of Present Illness: MALLIK RIZK is a 81 y.o. male who presents for follow-up evaluation.   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 He is 81 years old and has been followed for coronary artery disease, cardiac syndrome X., aortic stenosis status post bioprosthetic aortic valve replacement, and atrial fibrillation. He underwent aortic valve replacement with a 25 mm Edwards pericardial bioprosthesis and left-sided radiofrequency cryo- Cox maze procedure with ligation of the left atrial appendage in 2013. He's had recurrence of atrial fibrillation and required amiodarone. He subsequently has discontinued amiodarone and has been followed closely by Dr. Lovena Le. At the time of his last visit Tikosyn was discussed. He's had a lot of problems with autonomic dysfunction, supine hypertension, and orthostatic hypotension. He has learned to deal with this as it has become a chronic issue. He has been seen at [facility], but has not found any treatment to really help much.  He recently complained of progressive shortness of breath. He denies orthopnea or PND. He has not had leg swelling or abdominal swelling. Shortness of breath occurs with climbing stairs or moderate level activities. He's had no recent chest pain or pressure.  Past Medical History  Diagnosis Date  . 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  . Coronary artery disease     status post multiple prior percutaneous coronary interventions, microvascular angina per Dr Olevia Perches  . Aortic stenosis     moderate aortic stenosis  . Hyperlipidemia   . Hypercoagulable state  ([place])     chronically anticoagulated with coumadin  . Heart murmur   . Stroke ([place])     1990  . Hyperthyroidism   . Benign prostatic hypertrophy   . Arthritis   . Paroxysmal atrial fibrillation ([place])     DR. Lia Foyer,   . Hypothyroidism     Dr. Elyse Hsu  . MGUS (monoclonal gammopathy of unknown significance) 02/17/2013    Past Surgical History  Procedure Laterality Date  . Pacemaker insertion  1991    Guidant PPM, most recent Generator Change by Dr Olevia Perches was 08/22/06  . Hemrrhoidectomy    . Cardiac catheterization      11  . Cardioversion    . Appendectomy    . Aortic valve replacement  03/15/2011    Procedure: AORTIC VALVE REPLACEMENT (AVR);  Surgeon: Gaye Pollack, MD;  Location: [place];  Service: Open Heart Surgery;  Laterality: N/A;  . Maze  03/15/2011    Procedure: MAZE;  Surgeon: Gaye Pollack, MD;  Location: [place];  Service: Open Heart Surgery;  Laterality: N/A;  . Tee without cardioversion  04/15/2011    Procedure: TRANSESOPHAGEAL ECHOCARDIOGRAM (TEE);  Surgeon: Loralie Champagne, MD;  Location: [place];  Service: Cardiovascular;  Laterality: N/A;  . Cardioversion  04/15/2011    Procedure: CARDIOVERSION;  Surgeon: Loralie Champagne, MD;  Location[facility] ENDOSCOPY;  Service: Cardiovascular;  Laterality: N/A;  . Left and right heart catheterization with coronary angiogram Bilateral 02/01/2011    Procedure: LEFT AND RIGHT HEART CATHETERIZATION WITH CORONARY ANGIOGRAM;  Surgeon: Hillary Bow, MD;  Location: [facility] CATH LAB;  Service: Cardiovascular;  Laterality:  Bilateral;  . Tee without cardioversion N/A 09/11/2014    Procedure: TRANSESOPHAGEAL ECHOCARDIOGRAM (TEE);  Surgeon: Sueanne Margarita, MD;  Location: [place];  Service: Cardiovascular;  Laterality: N/A;  . Cardioversion N/A 09/11/2014    Procedure: CARDIOVERSION;  Surgeon: Sueanne Margarita, MD;  Location: [facility] ENDOSCOPY;  Service: Cardiovascular;  Laterality: N/A;  . Ep implantable device N/A 06/16/2015    Procedure: Pacemaker  Implant;  Surgeon: Evans Lance, MD;  Location: [place] CV LAB;  Service: Cardiovascular;  Laterality: N/A;  . Cardioversion N/A 06/27/2015    Procedure: CARDIOVERSION;  Surgeon: Thayer Headings, MD;  Location: [place];  Service: Cardiovascular;  Laterality: N/A;  . Cardioversion N/A 07/04/2015    Procedure: CARDIOVERSION;  Surgeon: Evans Lance, MD;  Location: [place];  Service: Cardiovascular;  Laterality: N/A;    Current Outpatient Prescriptions  Medication Sig Dispense Refill  . apixaban (ELIQUIS) 5 MG TABS tablet Take 5 mg by mouth 2 (two) times daily.    . bisacodyl (DULCOLAX) 5 MG EC tablet Take 5 mg by mouth daily as needed (constipation).     . butalbital-acetaminophen-caffeine (FIORICET, ESGIC) 50-325-40 MG per tablet Take 1 tablet by mouth 2 (two) times daily as needed (osteo-arthritis pain).     . Coenzyme Q10 (COQ10) 100 MG CAPS Take 100 mg by mouth daily.    . Diclofenac Sodium 1.5 % SOLN Take 1 application by mouth 2 (two) times daily as needed (Knee pain).     Marland Kitchen doxazosin (CARDURA) 4 MG tablet Take 4 mg by mouth at bedtime.     . furosemide (LASIX) 20 MG tablet Take 1 tablet (20 mg total) by mouth daily. 90 tablet 3  . HYDROcodone-acetaminophen (NORCO) 10-325 MG tablet Take 0.5-1 tablets by mouth 2 (two) times daily as needed for moderate pain or severe pain.   0  . LORazepam (ATIVAN) 1 MG tablet Take 1 mg by mouth at bedtime.     . magnesium oxide (MAG-OX) 400 MG tablet Take 400 mg by mouth daily.    . metoprolol succinate (TOPROL-XL) 100 MG 24 hr tablet Take 1 tablet (100 mg total) by mouth 2 (two) times daily. 180 tablet 1  . Multiple Vitamin (MULTIVITAMIN WITH MINERALS) TABS tablet Take 1 tablet by mouth daily.    . nitroGLYCERIN (NITROSTAT) 0.4 MG SL tablet Place 1 tablet (0.4 mg total) under the tongue every 5 (five) minutes as needed for chest pain. 25 tablet 3  . OVER THE COUNTER MEDICATION Apply 1 patch topically daily as needed (neck and shoulder pain).  Chinese pain patch    . potassium chloride SA (KLOR-CON M20) 20 MEQ tablet Take 1 tablet (20 mEq total) by mouth daily. 90 tablet 3  . predniSONE (DELTASONE) 5 MG tablet Take 5 mg by mouth every other day.  4  . rosuvastatin (CRESTOR) 20 MG tablet Take 1 tablet (20 mg total) by mouth every other day. At night 90 tablet 3   No current facility-administered medications for this visit.    Allergies:   Contrast media   Social History:  The patient  reports that he has never smoked. He has never used smokeless tobacco. He reports that he drinks alcohol. He reports that he does not use illicit drugs.   Family History:  The patient's family history includes Heart disease in his brother. There is no history of Anesthesia problems, Hypotension, Malignant hyperthermia, or Pseudochol deficiency.    ROS:  Please see the history of present illness.  Otherwise, review of systems is positive for visual disturbance (ocular myasthenia), osteoarthritis of multiple joints (hands, shoulders, hips).  All other systems are reviewed and negative.    PHYSICAL EXAM: VS:  BP 114/60 mmHg  Pulse 60  Ht 6\' 1"  (1.854 m)  Wt 222 lb 8 oz (100.925 kg)  BMI 29.36 kg/m2 , BMI Body mass index is 29.36 kg/(m^2). GEN: Well nourished, well developed, in no acute distress HEENT: normal Neck: no JVD, no masses. No carotid bruits Cardiac: RRR with 2/6 systolic ejection murmur at the right upper sternal border               Respiratory:  clear to auscultation bilaterally, normal work of breathing GI: soft, nontender, nondistended, + BS MS: no deformity or atrophy Ext: no pretibial edema, pedal pulses 2+= bilaterally Skin: warm and dry, no rash Neuro:  Strength and sensation are intact Psych: euthymic mood, full affect  EKG:  EKG is not ordered today.  Recent Labs: 09/10/2014: TSH 2.203 06/16/2015: BUN 13; Creatinine, Ser 0.96; Hemoglobin 12.6*; Platelets 196; Potassium 3.9; Sodium 140   Lipid Panel     Component  Value Date/Time   CHOL 259* 09/10/2014 1530   TRIG 113 09/10/2014 1530   HDL 77 09/10/2014 1530   CHOLHDL 3.4 09/10/2014 1530   VLDL 23 09/10/2014 1530   LDLCALC 159* 09/10/2014 1530   LDLDIRECT 117.9 11/16/2010 1355      Wt Readings from Last 3 Encounters:  08/12/15 222 lb 8 oz (100.925 kg)  07/04/15 225 lb (102.059 kg)  07/03/15 225 lb (102.059 kg)     Cardiac Studies Reviewed: 2D Echo: Study Conclusions  - Left ventricle: The cavity size was mildly dilated. Wall  thickness was normal. Systolic function was normal. The estimated  ejection fraction was in the range of 55% to 60%. Wall motion was  normal; there were no regional wall motion abnormalities. The  study is not technically sufficient to allow evaluation of LV  diastolic function. - Aortic valve: A bioprosthesis was present and functioning  normally. Mean gradient (S): 12 mm Hg. Peak gradient (S): 27 mm  Hg. Valve area (VTI): 1.48 cm^2. Valve area (Vmax): 1.44 cm^2.  Valve area (Vmean): 1.65 cm^2. - Mitral valve: Calcified annulus. There was mild regurgitation  directed centrally. - Left atrium: The atrium was severely dilated. - Right ventricle: The cavity size was mildly dilated. - Right atrium: The atrium was moderately dilated. - Pulmonary arteries: Systolic pressure was mildly increased. PA  peak pressure: 41 mm Hg (S).  ASSESSMENT AND PLAN: 1.  Chronic diastolic heart failure, NYHA functional class II: Probably related to aortic valve disease, coronary disease, and hypertension. Echo reviewed and demonstrates normal LV systolic function, normal function of his aortic bioprosthesis, and no evidence of significant pulmonary hypertension (estimated PA pressure 41 mmHg). He has not had any improvement with furosemide. Recommend that he only use this as needed. Otherwise he will continue on his current medical program. An aggressive antihypertensive program is not possible with his autonomic  dysfunction.  2. Atrial fibrillation/flutter: Tolerating chronic anticoagulation. Status post permanent pacemaker. Considering Tikosyn. Followed closely by Dr Lovena Le.  3. PPM: recent device change noted.  4. CAD: no symptoms of angina. Continue current medical therapy.  5. HTN: stable.  Current medicines are reviewed with the patient today.  The patient does not have concerns regarding medicines.  Labs/ tests ordered today include:  No orders of the defined types were placed in this encounter.   Disposition:  FU one year  Signed, Sherren Mocha, MD  08/12/2015 12:10 PM    [facility] [place], [place], [place]  [address] Phone: [phone]; Fax: [phone]

## 2015-08-13 MED FILL — BUTALB-ACETAMIN-CAFF 50-325: 50-325-40 | 30 days supply | Qty: 60 | Fill #3

## 2015-08-13 MED FILL — ELIQUIS 5 MG TABLET: 5 | 30 days supply | Qty: 60 | Fill #9

## 2015-08-13 MED FILL — DOXAZOSIN MESYLATE 4 MG TAB: 4 | 30 days supply | Qty: 30 | Fill #3

## 2015-08-14 ENCOUNTER — Encounter: Payer: Self-pay | Admitting: Internal Medicine

## 2015-08-14 MED FILL — HYDROCODON-APAP 10-325: 10-325 | 50 days supply | Qty: 100 | Fill #0

## 2015-08-15 MED FILL — KLOR-CON M20 TABLET: 20 | 90 days supply | Qty: 90 | Fill #1

## 2015-08-18 ENCOUNTER — Ambulatory Visit: Payer: Medicare Other | Admitting: Cardiovascular Disease

## 2015-08-25 ENCOUNTER — Encounter: Payer: Self-pay | Admitting: Internal Medicine

## 2015-08-25 ENCOUNTER — Ambulatory Visit (INDEPENDENT_AMBULATORY_CARE_PROVIDER_SITE_OTHER): Payer: Medicare Other | Admitting: Internal Medicine

## 2015-08-25 ENCOUNTER — Encounter: Payer: Medicare Other | Admitting: Internal Medicine

## 2015-08-25 VITALS — BP 118/64 | HR 58 | Ht 73.0 in

## 2015-08-25 DIAGNOSIS — I480 Paroxysmal atrial fibrillation: Secondary | ICD-10-CM | POA: Diagnosis not present

## 2015-08-25 NOTE — Progress Notes (Addendum)
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 The patient has an extensive cardiac history with long-standing hypertension and sinus node dysfunction, status post pacemaker insertion, CAD, s/p multiple interventions, PAF, and is s/p AVR for aortic stenosis with surgical MAZE. The patient had maintained sinus rhythm on amiodarone therapy. He developed thyroid dysfunction and possible pulmonary toxicity and his amio has been stopped. He has had PAF and symptomatic PVC's. He was seen by Dr. Greggory Brandy and thought not to have enough symptoms to undergo catheter ablation of atrial fib. He has developed worsening symptoms. His 81 yo PPM leads began to experience noise resulting in inhibition of pacing and he went insertion of a new DDD PM on the right side about 8 weeks ago. He has had worsening atrial fib/flutter. He was cardioverted on May 4 and has maintained (to my surprise) NSR. He has both atrial and ventricular bigeminy at times. He feels better now that he is out of flutter.  Allergies  Allergen Reactions  . Contrast Media [Iodinated Diagnostic Agents] Hives     Current Outpatient Prescriptions  Medication Sig Dispense Refill  . apixaban (ELIQUIS) 5 MG TABS tablet Take 5 mg by mouth 2 (two) times daily.    . bisacodyl (DULCOLAX) 5 MG EC tablet Take 5 mg by mouth daily as needed (constipation).     . butalbital-acetaminophen-caffeine (FIORICET, ESGIC) 50-325-40 MG per tablet Take 1 tablet by mouth 2 (two) times daily as needed (osteo-arthritis pain).     . Coenzyme Q10 (COQ10) 100 MG CAPS Take 100 mg by mouth daily.    . Diclofenac Sodium 1.5 % SOLN Take 1 application by mouth 2 (two) times daily as needed (Knee pain).     Marland Kitchen doxazosin (CARDURA) 4 MG tablet Take 4 mg by mouth at bedtime.     . furosemide (LASIX) 20 MG tablet Take 1 tablet (20 mg total) by mouth daily. 90 tablet 3  . HYDROcodone-acetaminophen (NORCO) 10-325 MG tablet Take 0.5-1 tablets  by mouth 2 (two) times daily as needed for moderate pain or severe pain.   0  . LORazepam (ATIVAN) 1 MG tablet Take 1 mg by mouth at bedtime.     . magnesium oxide (MAG-OX) 400 MG tablet Take 400 mg by mouth daily.    . metoprolol succinate (TOPROL-XL) 100 MG 24 hr tablet Take 1 tablet (100 mg total) by mouth 2 (two) times daily. 180 tablet 1  . Multiple Vitamin (MULTIVITAMIN WITH MINERALS) TABS tablet Take 1 tablet by mouth daily.    . nitroGLYCERIN (NITROSTAT) 0.4 MG SL tablet Place 1 tablet (0.4 mg total) under the tongue every 5 (five) minutes as needed for chest pain. 25 tablet 3  . OVER THE COUNTER MEDICATION Apply 1 patch topically daily as needed (neck and shoulder pain). Chinese pain patch    . potassium chloride SA (KLOR-CON M20) 20 MEQ tablet Take 1 tablet (20 mEq total) by mouth daily. 90 tablet 3  . predniSONE (DELTASONE) 5 MG tablet Take 5 mg by mouth every other day.  4  . rosuvastatin (CRESTOR) 20 MG tablet Take 1 tablet (20 mg total) by mouth every other day. At night 90 tablet 3   No current facility-administered medications for this visit.     Past Medical History  Diagnosis Date  . Chronotropic incompetence with sinus node dysfunction (HCC)     Status post Guidant dual-mode, dual-pacing, dual-sensing  pacemaker   implantation now programmed  to AAI with recent generator change.  . Coronary artery disease     status post multiple prior percutaneous coronary interventions, microvascular angina per Dr Olevia Perches  . Aortic stenosis     moderate aortic stenosis  . Hyperlipidemia   . Hypercoagulable state ([place])     chronically anticoagulated with coumadin  . Heart murmur   . Stroke ([place])     1990  . Hyperthyroidism   . Benign prostatic hypertrophy   . Arthritis   . Paroxysmal atrial fibrillation ([place])     DR. Lia Foyer,   . Hypothyroidism     Dr. Elyse Hsu  . MGUS (monoclonal gammopathy of unknown significance) 02/17/2013    ROS:   All systems reviewed and negative  except as noted in the HPI.   Past Surgical History  Procedure Laterality Date  . Pacemaker insertion  1991    Guidant PPM, most recent Generator Change by Dr Olevia Perches was 08/22/06  . Hemrrhoidectomy    . Cardiac catheterization      11  . Cardioversion    . Appendectomy    . Aortic valve replacement  03/15/2011    Procedure: AORTIC VALVE REPLACEMENT (AVR);  Surgeon: Gaye Pollack, MD;  Location: [place];  Service: Open Heart Surgery;  Laterality: N/A;  . Maze  03/15/2011    Procedure: MAZE;  Surgeon: Gaye Pollack, MD;  Location: [place];  Service: Open Heart Surgery;  Laterality: N/A;  . Tee without cardioversion  04/15/2011    Procedure: TRANSESOPHAGEAL ECHOCARDIOGRAM (TEE);  Surgeon: Loralie Champagne, MD;  Location: [place];  Service: Cardiovascular;  Laterality: N/A;  . Cardioversion  04/15/2011    Procedure: CARDIOVERSION;  Surgeon: Loralie Champagne, MD;  Location: [facility] ENDOSCOPY;  Service: Cardiovascular;  Laterality: N/A;  . Left and right heart catheterization with coronary angiogram Bilateral 02/01/2011    Procedure: LEFT AND RIGHT HEART CATHETERIZATION WITH CORONARY ANGIOGRAM;  Surgeon: Hillary Bow, MD;  Location: [facility] CATH LAB;  Service: Cardiovascular;  Laterality: Bilateral;  . Tee without cardioversion N/A 09/11/2014    Procedure: TRANSESOPHAGEAL ECHOCARDIOGRAM (TEE);  Surgeon: Sueanne Margarita, MD;  Location: [place];  Service: Cardiovascular;  Laterality: N/A;  . Cardioversion N/A 09/11/2014    Procedure: CARDIOVERSION;  Surgeon: Sueanne Margarita, MD;  Location: [facility] ENDOSCOPY;  Service: Cardiovascular;  Laterality: N/A;  . Ep implantable device N/A 06/16/2015    Procedure: Pacemaker Implant;  Surgeon: Evans Lance, MD;  Location: [place] CV LAB;  Service: Cardiovascular;  Laterality: N/A;  . Cardioversion N/A 06/27/2015    Procedure: CARDIOVERSION;  Surgeon: Thayer Headings, MD;  Location: [place];  Service: Cardiovascular;  Laterality: N/A;  . Cardioversion N/A 07/04/2015      Procedure: CARDIOVERSION;  Surgeon: Evans Lance, MD;  Location: [facility] ENDOSCOPY;  Service: Cardiovascular;  Laterality: N/A;     Family History  Problem Relation Age of Onset  . Heart disease Brother     Twin brother has coronary disease and recent AVR for AS  . Anesthesia problems Neg Hx   . Hypotension Neg Hx   . Malignant hyperthermia Neg Hx   . Pseudochol deficiency Neg Hx      Social History   Social History  . Marital Status: Married    Spouse Name: N/A  . Number of Children: N/A  . Years of Education: N/A   Occupational History  . Not on file.   Social History Main Topics  . Smoking status: Never Smoker   . Smokeless tobacco:  Never Used  . Alcohol Use: 0.0 oz/week    0 Standard drinks or equivalent per week     Comment: Rarely.  . Drug Use: No  . Sexual Activity: Not on file   Other Topics Concern  . Not on file   Social History Narrative     BP 118/64 mmHg  Pulse 58  Ht 6\' 1"  (1.854 m)  SpO2 97%  Physical Exam:  Chronically ill appearing 81 year old man, NAD HEENT: Unremarkable Neck:  6 cm JVD, no thyromegally Back:  No CVA tenderness Lungs:  Clear with no wheezes, rales, or rhonchi. HEART:  IRegular rhythm, split S2. Abd:  soft, positive bowel sounds, no organomegally, no rebound, no guarding Ext:  2 plus pulses, no edema, no cyanosis, no clubbing Skin:  No rashes no nodules Neuro:  CN II through XII intact, motor grossly intact   DEVICE  Normal device function.  See PaceArt for details. He has not had any sustained atrial fib or flutter since May 4.  His new right sided DDD PM is working normally.   Assess/Plan:  1. Atrial fib/flutter/Pac's - he is in atrial bigeminy today. We turned on rate response on his PPM and he has been able to walk reasonably well in our halls. At this point, we will continue his current treatment as we do not have much else we can offer short of Tikosyn which he has not wanted to undergo. 2. Acute diastolic  heart failure - his symptoms are well compensated 3. PPM - he is s/p insertion of a DDD PM. His device is working normally. 4. CAD - he denies anginal symptoms except when he is exerting himself and his rate is high. These have been controlled since he has been back in rhythm. Will follow.  Mikle Bosworth.D.

## 2015-08-25 NOTE — Patient Instructions (Addendum)
Medication Instructions:  Your physician recommends that you continue on your current medications as directed. Please refer to the Current Medication list given to you today.   Labwork: NONE  Testing/Procedures: NONE  Follow-Up: Your physician wants you to follow-up [id number]  CHECK 11-24-15  AND  YEAR  WITH DR  Knox Saliva will receive a reminder letter in the mail two months in advance. If you don't receive a letter, please call our office to schedule the follow-up appointment.  Any Other Special Instructions Will Be Listed Below (If Applicable).     If you need a refill on your cardiac medications before your next appointment, please call your pharmacy.

## 2015-09-05 MED FILL — BUTALB-ACETAMIN-CAFF 50-325: 50-325-40 | 30 days supply | Qty: 60 | Fill #4

## 2015-09-15 LAB — CUP PACEART INCLINIC DEVICE CHECK
Implantable Lead Implant Date: 20170417
Implantable Lead Location: 753859
Implantable Lead Model: 7741
Implantable Lead Serial Number: 662696
Implantable Lead Serial Number: 751382
MDC IDC LEAD IMPLANT DT: 20170417
MDC IDC LEAD LOCATION: 753860
MDC IDC PG SERIAL: 718418
MDC IDC SESS DTM: 20170717170445

## 2015-09-23 ENCOUNTER — Telehealth: Payer: Self-pay | Admitting: *Deleted

## 2015-09-23 ENCOUNTER — Telehealth: Payer: Self-pay | Admitting: Internal Medicine

## 2015-09-23 ENCOUNTER — Ambulatory Visit: Payer: Medicare Other

## 2015-09-23 DIAGNOSIS — I493 Ventricular premature depolarization: Secondary | ICD-10-CM

## 2015-09-23 NOTE — Telephone Encounter (Signed)
Patient had Holter placed today.  Patient agreeable to appointment with Dr. Lovena Le on 10/22/15 at 8:30am to discuss results.

## 2015-09-23 NOTE — Telephone Encounter (Signed)
New Message: ° ° ° ° °Please call. °

## 2015-09-23 NOTE — Telephone Encounter (Signed)
Spoke with Dr. Lovena Le, who ordered 48 hour Holter.  Order entered.  Patient aware and agreeable to follow-up with Dr. Lovena Le next available to discuss results.  Patient will be out of town for 2.5 weeks starting next week.  Advised patient that I will call him later today with an appointment date/time.  He is aware and appreciative.

## 2015-09-23 NOTE — Telephone Encounter (Signed)
Patient called reporting that his HR is "around 40bpm".  Patient is aware of his atrial bigeminy, but states that he is also having runs of PVCs.  Patient sent manual transmission for review.  Patient feels that he needs a 48 hour Holter to further assess his PAC/PVC burden.  Advised patient that I will review with Dr. Lovena Le and call him back.  He verbalizes understanding.

## 2015-09-24 ENCOUNTER — Other Ambulatory Visit: Payer: Self-pay | Admitting: Cardiovascular Disease

## 2015-09-24 MED FILL — DICLOFENAC 1.5% TOPICAL SOL: 1.5 | 30 days supply | Qty: 150 | Fill #1

## 2015-09-24 MED FILL — predniSONE 5 MG TABS: 5 | 30 days supply | Qty: 100 | Fill #1

## 2015-09-24 MED FILL — BUTALB-ACETAMIN-CAFF 50-325: 50-325-40 | 30 days supply | Qty: 60 | Fill #5

## 2015-09-24 MED FILL — METOPROLOL SUCC ER 100 MG T: 100 | 90 days supply | Qty: 180 | Fill #1

## 2015-09-24 MED FILL — DOXAZOSIN MESYLATE 4 MG TAB: 4 | 30 days supply | Qty: 30 | Fill #4

## 2015-09-24 MED FILL — LORazepam 1 MG TABS: 1 | 90 days supply | Qty: 180 | Fill #1

## 2015-09-24 NOTE — Telephone Encounter (Signed)
Left message on machine for him to call me back.  I apologize that no one called him yesterday.  I was off and message was sent tot me.

## 2015-09-25 ENCOUNTER — Other Ambulatory Visit: Payer: Self-pay | Admitting: *Deleted

## 2015-09-25 DIAGNOSIS — Z5181 Encounter for therapeutic drug level monitoring: Secondary | ICD-10-CM | POA: Diagnosis not present

## 2015-09-25 MED ORDER — APIXABAN 5 MG PO TABS: 5.0000 mg | ORAL_TABLET | Freq: Two times a day (BID) | ORAL | 3 refills | Status: DC

## 2015-09-25 MED FILL — HYDROCODON-APAP 10-325: 10-325 | 50 days supply | Qty: 100 | Fill #0

## 2015-09-25 MED FILL — ELIQUIS 5 MG TABLET: 5 | 30 days supply | Qty: 60 | Fill #0

## 2015-09-25 MED FILL — ROSUVASTATIN CALCIUM 20 MG: 20 | 90 days supply | Qty: 90 | Fill #0 | Status: TO

## 2015-09-25 NOTE — Telephone Encounter (Signed)
Mechele Dawley, RN     09/23/15 12:43 PM  Note    Patient called reporting that his HR is "around 40bpm".  Patient is aware of his atrial bigeminy, but states that he is also having runs of PVCs.  Patient sent manual transmission for review.  Patient feels that he needs a 48 hour Holter to further assess his PAC/PVC burden.  Advised patient that I will review with Dr. Lovena Le and call him back.  He verbalizes understanding

## 2015-10-02 ENCOUNTER — Telehealth: Payer: Self-pay | Admitting: Internal Medicine

## 2015-10-02 NOTE — Telephone Encounter (Signed)
F/u   Pt calling to speak to [facility] Please call.

## 2015-10-02 NOTE — Telephone Encounter (Signed)
New Message: ° ° ° ° °Please call. °

## 2015-10-02 NOTE — Telephone Encounter (Signed)
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  [place] is at the beach and will be back 8/15 or 8/16 and contact Shelly to have monitor repeated

## 2015-10-07 ENCOUNTER — Encounter: Payer: Self-pay | Admitting: Internal Medicine

## 2015-10-10 DIAGNOSIS — R9431 Abnormal electrocardiogram [ECG] [EKG]: Secondary | ICD-10-CM | POA: Diagnosis not present

## 2015-10-10 DIAGNOSIS — I452 Bifascicular block: Secondary | ICD-10-CM | POA: Diagnosis not present

## 2015-10-10 DIAGNOSIS — I481 Persistent atrial fibrillation: Secondary | ICD-10-CM | POA: Diagnosis not present

## 2015-10-10 DIAGNOSIS — R000 Tachycardia, unspecified: Secondary | ICD-10-CM | POA: Diagnosis not present

## 2015-10-16 ENCOUNTER — Ambulatory Visit (INDEPENDENT_AMBULATORY_CARE_PROVIDER_SITE_OTHER): Payer: Medicare Other

## 2015-10-16 DIAGNOSIS — I493 Ventricular premature depolarization: Secondary | ICD-10-CM | POA: Diagnosis not present

## 2015-10-22 ENCOUNTER — Encounter: Payer: Medicare Other | Admitting: Internal Medicine

## 2015-10-22 DIAGNOSIS — R5383 Other fatigue: Secondary | ICD-10-CM | POA: Diagnosis not present

## 2015-10-22 DIAGNOSIS — M7071 Other bursitis of hip, right hip: Secondary | ICD-10-CM | POA: Diagnosis not present

## 2015-10-22 DIAGNOSIS — E559 Vitamin D deficiency, unspecified: Secondary | ICD-10-CM | POA: Diagnosis not present

## 2015-10-22 DIAGNOSIS — Z79899 Other long term (current) drug therapy: Secondary | ICD-10-CM | POA: Diagnosis not present

## 2015-10-22 DIAGNOSIS — M19041 Primary osteoarthritis, right hand: Secondary | ICD-10-CM | POA: Diagnosis not present

## 2015-10-22 DIAGNOSIS — M2550 Pain in unspecified joint: Secondary | ICD-10-CM | POA: Diagnosis not present

## 2015-10-22 DIAGNOSIS — R5381 Other malaise: Secondary | ICD-10-CM | POA: Diagnosis not present

## 2015-10-22 DIAGNOSIS — M542 Cervicalgia: Secondary | ICD-10-CM | POA: Diagnosis not present

## 2015-10-23 MED FILL — predniSONE 5 MG TABS: 5 | 30 days supply | Qty: 100 | Fill #2

## 2015-10-23 MED FILL — DOXAZOSIN MESYLATE 4 MG TAB: 4 | 30 days supply | Qty: 30 | Fill #5

## 2015-10-23 MED FILL — ELIQUIS 5 MG TABLET: 5 | 90 days supply | Qty: 180 | Fill #0

## 2015-10-24 ENCOUNTER — Ambulatory Visit (INDEPENDENT_AMBULATORY_CARE_PROVIDER_SITE_OTHER): Payer: Medicare Other | Admitting: Internal Medicine

## 2015-10-24 ENCOUNTER — Encounter: Payer: Self-pay | Admitting: Internal Medicine

## 2015-10-24 VITALS — BP 116/58 | Ht 73.0 in | Wt 225.0 lb

## 2015-10-24 DIAGNOSIS — I350 Nonrheumatic aortic (valve) stenosis: Secondary | ICD-10-CM

## 2015-10-24 NOTE — Patient Instructions (Signed)
Medication Instructions:  Your physician recommends that you continue on your current medications as directed. Please refer to the Current Medication list given to you today.   Labwork: None Ordered   Testing/Procedures: None Ordered   Follow-Up: Your physician wants you to follow-up in: Patient will call when needed.  You will receive a reminder letter in the mail two months in advance. If you don't receive a letter, please call our office to schedule the follow-up appointment.   If you need a refill on your cardiac medications before your next appointment, please call your pharmacy.   Thank you for choosing [facility]! Christen Bame, RN [phone]

## 2015-10-24 NOTE — Progress Notes (Signed)
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 The patient has an extensive cardiac history with long-standing hypertension and sinus node dysfunction, status post pacemaker insertion, CAD, s/p multiple interventions, PAF, and is s/p AVR for aortic stenosis with surgical MAZE. The patient had maintained sinus rhythm on amiodarone therapy. He developed thyroid dysfunction and possible pulmonary toxicity and his amio has been stopped. He has had PAF and symptomatic PVC's and PAC's. He was at the beach 2 weeks ago and went back ton atrial fib/flutter and underwent DCCV. He wore a cardiac monitor which demonstrated frequent PVC's and PAC's and minimal NSVT.   Allergies  Allergen Reactions  . Contrast Media [Iodinated Diagnostic Agents] Hives     Current Outpatient Prescriptions  Medication Sig Dispense Refill  . apixaban (ELIQUIS) 5 MG TABS tablet Take 1 tablet (5 mg total) by mouth 2 (two) times daily. 180 tablet 3  . bisacodyl (DULCOLAX) 5 MG EC tablet Take 5 mg by mouth daily as needed (constipation).     . butalbital-acetaminophen-caffeine (FIORICET, ESGIC) 50-325-40 MG per tablet Take 1 tablet by mouth 2 (two) times daily as needed (osteo-arthritis pain).     . Coenzyme Q10 (COQ10) 100 MG CAPS Take 100 mg by mouth daily.    . Diclofenac Sodium 1.5 % SOLN Take 1 application by mouth 2 (two) times daily as needed (Knee pain).     Marland Kitchen doxazosin (CARDURA) 4 MG tablet Take 4 mg by mouth at bedtime.     . furosemide (LASIX) 20 MG tablet Take 1 tablet (20 mg total) by mouth daily. 90 tablet 3  . HYDROcodone-acetaminophen (NORCO) 10-325 MG tablet Take 0.5-1 tablets by mouth 2 (two) times daily as needed for moderate pain or severe pain.   0  . LORazepam (ATIVAN) 1 MG tablet Take 1 mg by mouth at bedtime.     . magnesium oxide (MAG-OX) 400 MG tablet Take 400 mg by mouth daily.    . metoprolol succinate (TOPROL-XL) 100 MG 24 hr tablet Take 1 tablet (100  mg total) by mouth 2 (two) times daily. 180 tablet 1  . Multiple Vitamin (MULTIVITAMIN WITH MINERALS) TABS tablet Take 1 tablet by mouth daily.    . nitroGLYCERIN (NITROSTAT) 0.4 MG SL tablet Place 1 tablet (0.4 mg total) under the tongue every 5 (five) minutes as needed for chest pain. 25 tablet 3  . OVER THE COUNTER MEDICATION Apply 1 patch topically daily as needed (neck and shoulder pain). Chinese pain patch    . potassium chloride SA (KLOR-CON M20) 20 MEQ tablet Take 1 tablet (20 mEq total) by mouth daily. 90 tablet 3  . predniSONE (DELTASONE) 5 MG tablet Take 5 mg by mouth every other day.  4  . rosuvastatin (CRESTOR) 20 MG tablet TAKE 1 TABLET BY MOUTH AT BEDTIME. 90 tablet 3   No current facility-administered medications for this visit.      Past Medical History:  Diagnosis Date  . Aortic stenosis    moderate aortic stenosis  . Arthritis   . Benign prostatic hypertrophy   . 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  . Coronary artery disease    status post multiple prior percutaneous coronary interventions, microvascular angina per Dr Olevia Perches  . Heart murmur   . Hypercoagulable state ([place])    chronically anticoagulated with  coumadin  . Hyperlipidemia   . Hyperthyroidism   . Hypothyroidism    Dr. Elyse Hsu  . MGUS (monoclonal gammopathy of unknown significance) 02/17/2013  . Paroxysmal atrial fibrillation ([place])    DR. Lia Foyer,   . Stroke ([place])    1990    ROS:   All systems reviewed and negative except as noted in the HPI.   Past Surgical History:  Procedure Laterality Date  . AORTIC VALVE REPLACEMENT  03/15/2011   Procedure: AORTIC VALVE REPLACEMENT (AVR);  Surgeon: Gaye Pollack, MD;  Location: [place];  Service: Open Heart Surgery;  Laterality: N/A;  . APPENDECTOMY    . CARDIAC CATHETERIZATION     11  .  CARDIOVERSION    . CARDIOVERSION  04/15/2011   Procedure: CARDIOVERSION;  Surgeon: Loralie Champagne, MD;  Location: [place];  Service: Cardiovascular;  Laterality: N/A;  . CARDIOVERSION N/A 09/11/2014   Procedure: CARDIOVERSION;  Surgeon: Sueanne Margarita, MD;  Location: [facility] ENDOSCOPY;  Service: Cardiovascular;  Laterality: N/A;  . CARDIOVERSION N/A 06/27/2015   Procedure: CARDIOVERSION;  Surgeon: Thayer Headings, MD;  Location: [facility] ENDOSCOPY;  Service: Cardiovascular;  Laterality: N/A;  . CARDIOVERSION N/A 07/04/2015   Procedure: CARDIOVERSION;  Surgeon: Evans Lance, MD;  Location: [place];  Service: Cardiovascular;  Laterality: N/A;  . EP IMPLANTABLE DEVICE N/A 06/16/2015   Procedure: Pacemaker Implant;  Surgeon: Evans Lance, MD;  Location: [place] CV LAB;  Service: Cardiovascular;  Laterality: N/A;  . hemrrhoidectomy    . LEFT AND RIGHT HEART CATHETERIZATION WITH CORONARY ANGIOGRAM Bilateral 02/01/2011   Procedure: LEFT AND RIGHT HEART CATHETERIZATION WITH CORONARY ANGIOGRAM;  Surgeon: Hillary Bow, MD;  Location: [facility] CATH LAB;  Service: Cardiovascular;  Laterality: Bilateral;  . MAZE  03/15/2011   Procedure: MAZE;  Surgeon: Gaye Pollack, MD;  Location: [place];  Service: Open Heart Surgery;  Laterality: N/A;  . PACEMAKER INSERTION  1991   Guidant PPM, most recent Generator Change by Dr Olevia Perches was 08/22/06  . TEE WITHOUT CARDIOVERSION  04/15/2011   Procedure: TRANSESOPHAGEAL ECHOCARDIOGRAM (TEE);  Surgeon: Loralie Champagne, MD;  Location: [place];  Service: Cardiovascular;  Laterality: N/A;  . TEE WITHOUT CARDIOVERSION N/A 09/11/2014   Procedure: TRANSESOPHAGEAL ECHOCARDIOGRAM (TEE);  Surgeon: Sueanne Margarita, MD;  Location: [facility] ENDOSCOPY;  Service: Cardiovascular;  Laterality: N/A;     Family History  Problem Relation Age of Onset  . Heart disease Brother     Twin brother has coronary disease and recent AVR for AS  . Anesthesia problems Neg Hx   . Hypotension Neg Hx   .  Malignant hyperthermia Neg Hx   . Pseudochol deficiency Neg Hx      Social History   Social History  . Marital status: Married    Spouse name: N/A  . Number of children: N/A  . Years of education: N/A   Occupational History  . Not on file.   Social History Main Topics  . Smoking status: Never Smoker  . Smokeless tobacco: Never Used  . Alcohol use 0.0 oz/week     Comment: Rarely.  . Drug use: No  . Sexual activity: Not on file   Other Topics Concern  . Not on file   Social History Narrative  . No narrative on file     BP (!) 116/58   Ht 6\' 1"  (1.854 m)   Wt 225 lb (102.1 kg)   BMI 29.69 kg/m   Physical Exam:  Chronically ill appearing 81 year old  man, NAD HEENT: Unremarkable Neck:  6 cm JVD, no thyromegally Back:  No CVA tenderness Lungs:  Clear with no wheezes, rales, or rhonchi. HEART:  IRegular rhythm, split S2. Abd:  soft, positive bowel sounds, no organomegally, no rebound, no guarding Ext:  2 plus pulses, no edema, no cyanosis, no clubbing Skin:  No rashes no nodules Neuro:  CN II through XII intact, motor grossly intact  ECG - nsr with atrial pacing and PAC's in a bigeminal fashion, and PVC's, likely originating from the RV  Assess/Plan:  1. Atrial fib/flutter/Pac's - he is in atrial bigeminy at times.  At this point, we will continue his current treatment as we do not have much else we can offer short of Tikosyn which he has not wanted to undergo or PAC ablation. 2. Acute diastolic heart failure - his symptoms are well compensated 3. PPM - he is s/p insertion of a DDD PM. His device is working normally. 4. CAD - he denies anginal symptoms except when he is exerting himself and his rate is high. These have been controlled since he has been back in rhythm. Will follow.  Mikle Bosworth.D.

## 2015-10-28 ENCOUNTER — Ambulatory Visit (INDEPENDENT_AMBULATORY_CARE_PROVIDER_SITE_OTHER): Payer: Medicare Other | Admitting: Neurology

## 2015-10-28 ENCOUNTER — Encounter: Payer: Self-pay | Admitting: Neurology

## 2015-10-28 VITALS — BP 120/84 | HR 59 | Ht 74.0 in | Wt 225.0 lb

## 2015-10-28 DIAGNOSIS — G7000 Myasthenia gravis without (acute) exacerbation: Secondary | ICD-10-CM | POA: Insufficient documentation

## 2015-10-28 MED ORDER — PREDNISONE 10 MG PO TABS
10.0000 mg | ORAL_TABLET | Freq: Every day | ORAL | 5 refills | Status: DC
Start: 2015-10-28 — End: 2016-04-30

## 2015-10-28 MED ORDER — PYRIDOSTIGMINE BROMIDE 60 MG PO TABS
60.0000 mg | ORAL_TABLET | Freq: Three times a day (TID) | ORAL | 5 refills | Status: DC
Start: 2015-10-28 — End: 2016-07-02

## 2015-10-28 MED FILL — predniSONE 10 MG TABS: 10 | 30 days supply | Qty: 30 | Fill #0

## 2015-10-28 MED FILL — PYRIDOSTIGMINE BR 60 MG TAB: 60 | 30 days supply | Qty: 90 | Fill #0

## 2015-10-28 NOTE — Patient Instructions (Addendum)
1.  Start mestinon 60mg  three times daily 2.  After one week, start prednisone 10mg  daily 3.  Call with an update in 3-4 weeks  Return to clinic on October 10th at 3pm.

## 2015-10-28 NOTE — Progress Notes (Signed)
[facility] Clinic Note - Initial Visit   Date: 10/28/15  [name] MRN: [id number] DOB: Oct 03, 1934   Dear Dr. Elyse Hsu:  Thank you for your kind referral of [name] for consultation of myasthenia gravis. Although his history is well known to you, please allow [place] to reiterate it for the purpose of our medical record. The patient was accompanied to the clinic by self.   History of Present Illness: [name] is a 81 y.o. 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  His has multiple medical comorbidities including hypertension, amiodarone-induced hypothyroidism and pulmonary fibrosis, cerebellar stroke (1989, age 33), sinus node dysfunction s/p PPM, CAD s/p multiple interventions, PAF on chronic anticoagulation with Eliquis, and is s/p AVR for aortic stenosis with MAZE.  He reports having history of fluctuating and fatigable bilateral ptosis, greater on the right, and binocular diplopia since 2010.  In 2014, his acetylcholine receptor antibodies returned negative.  Because of high clinical suspicion for myasthenia, he was referred to [facility] for SFEMG which showed jitter in the left orbicularis oculi.  The diagnosis of seronegative myasthenia gravis was made and he was started on mestinon 30-60mg  TID, however, admits that he did not take this very long.  CT chest was negative for thymoma.  Over the years, he has simply learned to compensate with his vertical diplopia and ptosis and remained off any medications. There has been no change in his ocular symptoms since onset.  He does not have any difficulty with day-to-day activities, including driving.   Recently, he began having increased dyspnea with exertion and greater bilateral leg fatigue and achy pain, which led to him thinking that his myasthenia may be getting worse.  He was able to walk a greater distance, but now gets very short of breath and needs to  rest.  With this said, he also acknowledges his significant cardiac comorbidities and is unsure whether his MG is getting worse or if his symptoms are stemming from cardiac and pulmonary disease.  He had mild dysphagia and had barium swallow which showed slowed peristalsis.  He has a reclining bed and tends to keep the head up, but this has been the same for many years.   He does not have slurred speech, proximal weakness, or falls.    He is mostly bothered by his cardiac arrhythmias, especially when he reverts back into atrial flutter with tachycardia because it causes chest tightness and angina.  Because this can be triggered by a number of things, he has been less active as to avoid increasing his heart rate and does feel deconditioned.  He also has significant arthritis involving his neck, shoulder, hands, hips, and knees for which he sees Dr. Estanislado Pandy.  Out-side paper records, electronic medical record, and images have been reviewed where available and summarized as:  Lab Results  Component Value Date   TSH 2.203 09/10/2014   Lab Results  Component Value Date   HGBA1C 5.8 (H) 11/26/2013   Lab Results  Component Value Date   ESRSEDRATE 42 (H) 11/26/2013   CTA head 05/17/2012: Old left inferior cerebellar infarction.  No abnormality of the cerebral hemispheres. Atherosclerosis of both carotid siphon regions without evidence of flow-limiting stenosis.  Narrowing is no greater than about 25-30% in that region. The right vertebral artery is patent to the basilar.  There is mild atherosclerotic narrowing of the distal 1 cm without evidence of a critical stenosis.  The left vertebral artery is  a tiny vessel. This could be antegrade flow, reconstituted flow or retrograde flow.  See above for full discussion.  There is no evidence of a dominant or correctable posterior circulation stenosis.   Past Medical History:  Diagnosis Date  . Aortic stenosis    moderate aortic stenosis  . Arthritis     . Benign prostatic hypertrophy   . 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  . Coronary artery disease    status post multiple prior percutaneous coronary interventions, microvascular angina per Dr Olevia Perches  . Heart murmur   . Hypercoagulable state ([place])    chronically anticoagulated with coumadin  . Hyperlipidemia   . Hyperthyroidism   . Hypothyroidism    Dr. Elyse Hsu  . MGUS (monoclonal gammopathy of unknown significance) 02/17/2013  . Paroxysmal atrial fibrillation ([place])    DR. Lia Foyer,   . Stroke [facility])    1990    Past Surgical History:  Procedure Laterality Date  . AORTIC VALVE REPLACEMENT  03/15/2011   Procedure: AORTIC VALVE REPLACEMENT (AVR);  Surgeon: Gaye Pollack, MD;  Location: [place];  Service: Open Heart Surgery;  Laterality: N/A;  . APPENDECTOMY    . CARDIAC CATHETERIZATION     11  . CARDIOVERSION    . CARDIOVERSION  04/15/2011   Procedure: CARDIOVERSION;  Surgeon: Loralie Champagne, MD;  Location: [place];  Service: Cardiovascular;  Laterality: N/A;  . CARDIOVERSION N/A 09/11/2014   Procedure: CARDIOVERSION;  Surgeon: Sueanne Margarita, MD;  Location: [facility] ENDOSCOPY;  Service: Cardiovascular;  Laterality: N/A;  . CARDIOVERSION N/A 06/27/2015   Procedure: CARDIOVERSION;  Surgeon: Thayer Headings, MD;  Location: [facility] ENDOSCOPY;  Service: Cardiovascular;  Laterality: N/A;  . CARDIOVERSION N/A 07/04/2015   Procedure: CARDIOVERSION;  Surgeon: Evans Lance, MD;  Location: [place];  Service: Cardiovascular;  Laterality: N/A;  . EP IMPLANTABLE DEVICE N/A 06/16/2015   Procedure: Pacemaker Implant;  Surgeon: Evans Lance, MD;  Location: [place] CV LAB;  Service: Cardiovascular;  Laterality: N/A;  . hemrrhoidectomy    . LEFT AND RIGHT HEART CATHETERIZATION WITH CORONARY ANGIOGRAM Bilateral 02/01/2011   Procedure: LEFT  AND RIGHT HEART CATHETERIZATION WITH CORONARY ANGIOGRAM;  Surgeon: Hillary Bow, MD;  Location: [facility] CATH LAB;  Service: Cardiovascular;  Laterality: Bilateral;  . MAZE  03/15/2011   Procedure: MAZE;  Surgeon: Gaye Pollack, MD;  Location: [place];  Service: Open Heart Surgery;  Laterality: N/A;  . PACEMAKER INSERTION  1991   Guidant PPM, most recent Generator Change by Dr Olevia Perches was 08/22/06  . TEE WITHOUT CARDIOVERSION  04/15/2011   Procedure: TRANSESOPHAGEAL ECHOCARDIOGRAM (TEE);  Surgeon: Loralie Champagne, MD;  Location: [place];  Service: Cardiovascular;  Laterality: N/A;  . TEE WITHOUT CARDIOVERSION N/A 09/11/2014   Procedure: TRANSESOPHAGEAL ECHOCARDIOGRAM (TEE);  Surgeon: Sueanne Margarita, MD;  Location: [facility] ENDOSCOPY;  Service: Cardiovascular;  Laterality: N/A;     Medications:  Outpatient Encounter Prescriptions as of 10/28/2015  Medication Sig Note  . apixaban (ELIQUIS) 5 MG TABS tablet Take 1 tablet (5 mg total) by mouth 2 (two) times daily.   . bisacodyl (DULCOLAX) 5 MG EC tablet Take 5 mg by mouth daily as needed (constipation).    . butalbital-acetaminophen-caffeine (FIORICET, ESGIC) 50-325-40 MG per tablet Take 1 tablet by mouth 2 (two) times daily as needed (osteo-arthritis pain).    . Coenzyme Q10 (COQ10) 100  MG CAPS Take 100 mg by mouth daily.   . Diclofenac Sodium 1.5 % SOLN Take 1 application by mouth 2 (two) times daily as needed (Knee pain).    Marland Kitchen doxazosin (CARDURA) 4 MG tablet Take 4 mg by mouth at bedtime.    . furosemide (LASIX) 20 MG tablet Take 1 tablet (20 mg total) by mouth daily.   Marland Kitchen HYDROcodone-acetaminophen (NORCO) 10-325 MG tablet Take 0.5-1 tablets by mouth 2 (two) times daily as needed for moderate pain or severe pain.    Marland Kitchen LORazepam (ATIVAN) 1 MG tablet Take 1 mg by mouth at bedtime.    . magnesium oxide (MAG-OX) 400 MG tablet Take 400 mg by mouth daily.   . metoprolol succinate (TOPROL-XL) 100 MG 24 hr tablet Take 1 tablet (100 mg total) by mouth 2 (two)  times daily.   . Multiple Vitamin (MULTIVITAMIN WITH MINERALS) TABS tablet Take 1 tablet by mouth daily.   . nitroGLYCERIN (NITROSTAT) 0.4 MG SL tablet Place 1 tablet (0.4 mg total) under the tongue every 5 (five) minutes as needed for chest pain.   Marland Kitchen OVER THE COUNTER MEDICATION Apply 1 patch topically daily as needed (neck and shoulder pain). Chinese pain patch   . potassium chloride SA (KLOR-CON M20) 20 MEQ tablet Take 1 tablet (20 mEq total) by mouth daily.   . rosuvastatin (CRESTOR) 20 MG tablet TAKE 1 TABLET BY MOUTH AT BEDTIME.   . [DISCONTINUED] butalbital-acetaminophen-caffeine (FIORICET, ESGIC) 50-325-40 MG tablet Take by mouth. 10/28/2015: Received from: [place]: Take 1 tablet by mouth once daily as needed.  . [DISCONTINUED] predniSONE (DELTASONE) 5 MG tablet Take 5 mg by mouth every other day.   . predniSONE (DELTASONE) 10 MG tablet Take 1 tablet (10 mg total) by mouth daily with breakfast.   . pyridostigmine (MESTINON) 60 MG tablet Take 1 tablet (60 mg total) by mouth 3 (three) times daily.   . [DISCONTINUED] Coenzyme Q10 100 MG capsule Take by mouth. 10/28/2015: Received from: [place]: Take 100 mg by mouth daily.   No facility-administered encounter medications on file as of 10/28/2015.      Allergies:  Allergies  Allergen Reactions  . Contrast Media [Iodinated Diagnostic Agents] Hives  . Other Hives    Family History: Family History  Problem Relation Age of Onset  . Heart disease Brother     Twin brother has coronary disease and recent AVR for AS  . Anesthesia problems Neg Hx   . Hypotension Neg Hx   . Malignant hyperthermia Neg Hx   . Pseudochol deficiency Neg Hx     Social History: Social History  Substance Use Topics  . Smoking status: Never Smoker  . Smokeless tobacco: Never Used  . Alcohol use 0.0 oz/week     Comment: Rarely.   Social History   Social History Narrative  . No narrative on  file    Review of Systems:  CONSTITUTIONAL: No fevers, chills, night sweats, or weight loss.   EYES: +visual changes or eye pain ENT: No hearing changes.  No history of nose bleeds.   RESPIRATORY: No cough, wheezing and shortness of breath.   CARDIOVASCULAR: Negative for chest pain, + palpitations.   GI: Negative for abdominal discomfort, blood in stools or black stools.  No recent change in bowel habits.   GU:  No history of incontinence.   MUSCLOSKELETAL: +history of joint pain or swelling.  No myalgias.   SKIN: Negative for lesions, rash,  and itching.   HEMATOLOGY/ONCOLOGY: Negative for prolonged bleeding, bruising easily, and swollen nodes.  No history of cancer.   ENDOCRINE: Negative for cold or heat intolerance, polydipsia or goiter.   PSYCH:  No depression or anxiety symptoms.   NEURO: As Above.   Vital Signs:  BP 120/84   Pulse (!) 59   Ht 6\' 2"  (1.88 m)   Wt 225 lb (102.1 kg)   SpO2 97%   BMI 28.89 kg/m   Neurological Exam: MENTAL STATUS including orientation to time, place, person, recent and remote memory, attention span and concentration, language, and fund of knowledge is normal.  Speech is not dysarthric.  CRANIAL NERVES: II:  No visual field defects.  Unremarkable fundi.   III-IV-VI: Very small pupils, equal round and reactive to light.  There is mild right upgaze restriction, otherwise normal conjugate, extra-ocular eye movements in all directions of gaze.  No nystagmus. Moderate right ptosis at rest and mildly worse with upgaze, there is also intermittent mild right ptosis.   V:  Normal facial sensation.   VII:  Normal facial symmetry and movements.  Frontalis, orbicularis oculi, buccinator, and orbicularis oris is 5.5    VIII:  Normal hearing and vestibular function.   IX-X:  Normal palatal movement.   XI:  Normal shoulder shrug and head rotation.   XII:  Normal tongue strength and range of motion, no deviation or fasciculation.  MOTOR:  No atrophy,  fasciculations or abnormal movements.  No pronator drift.  Tone is normal.  There is no muscle fatigability with proximal muscle testing.  Neck flexion and extension is 5/5.    Right Upper Extremity:    Left Upper Extremity:    Deltoid  5/5   Deltoid  5/5   Biceps  5/5   Biceps  5/5   Triceps  5/5   Triceps  5/5   Wrist extensors  5/5   Wrist extensors  5/5   Wrist flexors  5/5   Wrist flexors  5/5   Finger extensors  5/5   Finger extensors  5/5   Finger flexors  5/5   Finger flexors  5/5   Dorsal interossei  5/5   Dorsal interossei  5/5   Abductor pollicis  5/5   Abductor pollicis  5/5   Tone (Ashworth scale)  0  Tone (Ashworth scale)  0   Right Lower Extremity:    Left Lower Extremity:    Hip flexors  5/5   Hip flexors  5/5   Hip extensors  5/5   Hip extensors  5/5   Knee flexors  5/5   Knee flexors  5/5   Knee extensors  5/5   Knee extensors  5/5   Dorsiflexors  5/5   Dorsiflexors  5/5   Plantarflexors  5/5   Plantarflexors  5/5   Toe extensors  5/5   Toe extensors  5/5   Toe flexors  5/5   Toe flexors  5/5   Tone (Ashworth scale)  0  Tone (Ashworth scale)  0   MSRs:  Right                                                                 Left brachioradialis 2+  brachioradialis 2+  biceps 2+  biceps 2+  triceps 2+  triceps 2+  patellar 2+  patellar 2+  ankle jerk 1+  ankle jerk 1+  Hoffman no  Hoffman no  plantar response down  plantar response down   SENSORY:  Normal and symmetric perception of light touch, pinprick, vibration, and proprioception.    COORDINATION/GAIT: Normal finger-to- nose-finger and heel-to-shin.  Intact rapid alternating movements bilaterally.  Able to rise from a chair without using arms.  Gait narrow based and stable.     IMPRESSION: It was my pleasure to see Dr. Velora Heckler, a 81 year-old gentleman, for evaluation and management of seronegative ocular myasthenia gravis.  He was diagnosed by SFEMG at [facility] by Dr. Queen Slough in 2014 and was  briefly on mestinon, but did not notice any benefit.  He has not been on mestinon or immunomodulatory drugs since 2014.   Today, he presents with stable and unchanged ocular symptoms and new complains of shortness of breath and myalgias involving the quadriceps muscles.  His exam continues to show isolated bilateral ptosis, worse on the right, and mild right upgaze paresis.  History and exam is consistent with isolated ocular myasthenia without evidence of bulbar or limb involvement.  I do not see evidence that his dyspnea is due to respiratory muscle weakness and is most likely cardiopulmonary in nature.  His leg myalgias may be due to deconditioning as well as referred pain from his arthritis, as myasthenia would not cause pain.  With intact motor strength, myopathy seems less likely.  I can certainly do additional lab and electrodiagnostic testing if pain persists or he develops weakness.  He did have AChR and MUSK antibodies recently checked with Dr. Estanislado Pandy, his rheumatologist, and I will await these results.  From a management perspective, the options include restarting mestinon 60mg  TID and/or adding prednisone. Side effects of both medications were discussed.  It was decided to start mestinon 60mg  TID and if no benefit after one week, start prednisone 10mg  daily.  If there is no improvement after being on prednisone 10mg  for two week, I will further increase this to 20mg  daily.    He will contact me with an update in 3-4 weeks and return to clinic in 6 weeks.   The duration of this appointment visit was 45 minutes of face-to-face time with the patient.  Greater than 50% of this time was spent in counseling, explanation of diagnosis, planning of further management, and coordination of care.   Thank you for allowing me to participate in patient's care.  If I can answer any additional questions, I would be pleased to do so.    Sincerely,    Donika K. Posey Pronto, DO

## 2015-10-29 MED FILL — BUTALB-ACETAMIN-CAFF 50-325: 50-325-40 | 30 days supply | Qty: 60 | Fill #6

## 2015-10-29 NOTE — Progress Notes (Signed)
Note routed to both.

## 2015-11-10 ENCOUNTER — Telehealth: Payer: Self-pay | Admitting: Neurology

## 2015-11-10 NOTE — Telephone Encounter (Signed)
Labs rec'd dated 10/22/2015:  CMP wnl, ESR 16, MuSK neg, AChR binding neg, CK 76, vitamin D 26*  Called and discussed labs with Dr. Velora Heckler.  If no improvement with double vision, recommended increasing prednisone to 88m daily.  Donika K. PPosey Pronto DO

## 2015-11-12 ENCOUNTER — Telehealth: Payer: Self-pay | Admitting: Internal Medicine

## 2015-11-12 ENCOUNTER — Encounter: Payer: Self-pay | Admitting: Internal Medicine

## 2015-11-12 NOTE — Telephone Encounter (Signed)
Spoke with patient and let him know to send a transmission from his monitor and will have Dr Lovena Le review tomorrow

## 2015-11-12 NOTE — Telephone Encounter (Signed)
New message       Pt request to talk to [place] only

## 2015-11-13 MED FILL — KLOR-CON M20 TABLET: 20 | 90 days supply | Qty: 90 | Fill #2

## 2015-11-13 MED FILL — DICLOFENAC 1.5% TOPICAL SOL: 1.5 | 30 days supply | Qty: 150 | Fill #2

## 2015-11-13 NOTE — Telephone Encounter (Signed)
Received transmission and have for Dr Lovena Le to review. Patient called back today and wants to come in tomorrow to be seen.  I have put [name] on at 11:30am.  He is aware

## 2015-11-14 ENCOUNTER — Other Ambulatory Visit: Payer: Self-pay

## 2015-11-14 ENCOUNTER — Encounter: Payer: Self-pay | Admitting: Internal Medicine

## 2015-11-14 ENCOUNTER — Ambulatory Visit (INDEPENDENT_AMBULATORY_CARE_PROVIDER_SITE_OTHER): Payer: Medicare Other | Admitting: Internal Medicine

## 2015-11-14 DIAGNOSIS — I4891 Unspecified atrial fibrillation: Secondary | ICD-10-CM | POA: Diagnosis not present

## 2015-11-14 MED ORDER — NITROGLYCERIN 0.4 MG SL SUBL: 0.4000 mg | SUBLINGUAL_TABLET | SUBLINGUAL | 3 refills | Status: DC | PRN

## 2015-11-14 MED FILL — NITROGLYCERIN 0.4 MG TAB SL: 0.4 | 20 days supply | Qty: 25 | Fill #0

## 2015-11-14 MED FILL — HYDROCODON-APAP 10-325: 10-325 | 50 days supply | Qty: 100 | Fill #0

## 2015-11-14 NOTE — Patient Instructions (Signed)
Medication Instructions:  Your physician recommends that you continue on your current medications as directed. Please refer to the Current Medication list given to you today.   Labwork: none  Testing/Procedures: none  Follow-Up: Your physician wants you to follow-up in: 6 months with Dr. Taylor. You will receive a reminder letter in the mail two months in advance. If you don't receive a letter, please call our office to schedule the follow-up appointment.   Any Other Special Instructions Will Be Listed Below (If Applicable).     If you need a refill on your cardiac medications before your next appointment, please call your pharmacy.   

## 2015-11-14 NOTE — Progress Notes (Signed)
HPI [name] returns today for ongoing evaluation and management of his many arrhythmias. The patient has an extensive cardiac history with long-standing hypertension and sinus node dysfunction, status post pacemaker insertion, CAD, s/p multiple interventions, PAF, and is s/p AVR for aortic stenosis with surgical MAZE. The patient had maintained sinus rhythm on amiodarone therapy. He developed thyroid dysfunction and possible pulmonary toxicity and his amio has been stopped. He has reverted back to atrial fib/flutter. He is on high dose metoprolol and his ventricular rate has been controlled. Previously when he was out of rhythm, his HR's were very high and he felt terribly. He thinks that he is better now than when he is in Sinus bradycardia with PVC's.  Allergies  Allergen Reactions  . Contrast Media [Iodinated Diagnostic Agents] Hives     Current Outpatient Prescriptions  Medication Sig Dispense Refill  . apixaban (ELIQUIS) 5 MG TABS tablet Take 1 tablet (5 mg total) by mouth 2 (two) times daily. 180 tablet 3  . bisacodyl (DULCOLAX) 5 MG EC tablet Take 5 mg by mouth daily as needed (constipation).     . butalbital-acetaminophen-caffeine (FIORICET, ESGIC) 50-325-40 MG per tablet Take 1 tablet by mouth 2 (two) times daily as needed (osteo-arthritis pain).     . Coenzyme Q10 (COQ10) 100 MG CAPS Take 100 mg by mouth daily.    . Diclofenac Sodium 1.5 % SOLN Take 1 application by mouth 2 (two) times daily as needed (Knee pain).     Marland Kitchen doxazosin (CARDURA) 4 MG tablet Take 4 mg by mouth at bedtime.     . furosemide (LASIX) 20 MG tablet Take 20 mg by mouth daily as needed for fluid.    Marland Kitchen HYDROcodone-acetaminophen (NORCO) 10-325 MG tablet Take 0.5-1 tablets by mouth 2 (two) times daily as needed for moderate pain or severe pain.   0  . LORazepam (ATIVAN) 1 MG tablet Take 1 mg by mouth at bedtime.     . magnesium oxide (MAG-OX) 400 MG tablet Take 400 mg by mouth daily.    . metoprolol succinate  (TOPROL-XL) 100 MG 24 hr tablet Take 1 tablet (100 mg total) by mouth 2 (two) times daily. 180 tablet 1  . Multiple Vitamin (MULTIVITAMIN WITH MINERALS) TABS tablet Take 1 tablet by mouth daily.    . nitroGLYCERIN (NITROSTAT) 0.4 MG SL tablet Place 1 tablet (0.4 mg total) under the tongue every 5 (five) minutes as needed for chest pain. 25 tablet 3  . OVER THE COUNTER MEDICATION Apply 1 patch topically daily as needed (neck and shoulder pain). Chinese pain patch    . potassium chloride SA (KLOR-CON M20) 20 MEQ tablet Take 1 tablet (20 mEq total) by mouth daily. 90 tablet 3  . predniSONE (DELTASONE) 10 MG tablet Take 1 tablet (10 mg total) by mouth daily with breakfast. 30 tablet 5  . predniSONE (DELTASONE) 5 MG tablet as directed. Also has a 10 mg tablet - take as directed    . pyridostigmine (MESTINON) 60 MG tablet Take 1 tablet (60 mg total) by mouth 3 (three) times daily. 90 tablet 5  . rosuvastatin (CRESTOR) 20 MG tablet TAKE 1 TABLET BY MOUTH AT BEDTIME. 90 tablet 3   No current facility-administered medications for this visit.      Past Medical History:  Diagnosis Date  . Aortic stenosis    moderate aortic stenosis  . Arthritis   . Benign prostatic hypertrophy   . Chronotropic incompetence with sinus node dysfunction (HCC)  Status post Guidant dual-mode, dual-pacing, dual-sensing  pacemaker   implantation now programmed to AAI with recent generator change.  . Coronary artery disease    status post multiple prior percutaneous coronary interventions, microvascular angina per Dr Olevia Perches  . Heart murmur   . Hypercoagulable state ([place])    chronically anticoagulated with coumadin  . Hyperlipidemia   . Hyperthyroidism   . Hypothyroidism    Dr. Elyse Hsu  . MGUS (monoclonal gammopathy of unknown significance) 02/17/2013  . Paroxysmal atrial fibrillation ([place])    DR. Lia Foyer,   . Stroke ([place])    1990    ROS:   All systems reviewed and negative except as noted in the  HPI.   Past Surgical History:  Procedure Laterality Date  . AORTIC VALVE REPLACEMENT  03/15/2011   Procedure: AORTIC VALVE REPLACEMENT (AVR);  Surgeon: Gaye Pollack, MD;  Location: [place];  Service: Open Heart Surgery;  Laterality: N/A;  . APPENDECTOMY    . CARDIAC CATHETERIZATION     11  . CARDIOVERSION    . CARDIOVERSION  04/15/2011   Procedure: CARDIOVERSION;  Surgeon: Loralie Champagne, MD;  Location: [place];  Service: Cardiovascular;  Laterality: N/A;  . CARDIOVERSION N/A 09/11/2014   Procedure: CARDIOVERSION;  Surgeon: Sueanne Margarita, MD;  Location: [facility] ENDOSCOPY;  Service: Cardiovascular;  Laterality: N/A;  . CARDIOVERSION N/A 06/27/2015   Procedure: CARDIOVERSION;  Surgeon: Thayer Headings, MD;  Location: [facility] ENDOSCOPY;  Service: Cardiovascular;  Laterality: N/A;  . CARDIOVERSION N/A 07/04/2015   Procedure: CARDIOVERSION;  Surgeon: Evans Lance, MD;  Location: [place];  Service: Cardiovascular;  Laterality: N/A;  . EP IMPLANTABLE DEVICE N/A 06/16/2015   Procedure: Pacemaker Implant;  Surgeon: Evans Lance, MD;  Location: [place] CV LAB;  Service: Cardiovascular;  Laterality: N/A;  . hemrrhoidectomy    . LEFT AND RIGHT HEART CATHETERIZATION WITH CORONARY ANGIOGRAM Bilateral 02/01/2011   Procedure: LEFT AND RIGHT HEART CATHETERIZATION WITH CORONARY ANGIOGRAM;  Surgeon: Hillary Bow, MD;  Location[facility] CATH LAB;  Service: Cardiovascular;  Laterality: Bilateral;  . MAZE  03/15/2011   Procedure: MAZE;  Surgeon: Gaye Pollack, MD;  Location: [place];  Service: Open Heart Surgery;  Laterality: N/A;  . PACEMAKER INSERTION  1991   Guidant PPM, most recent Generator Change by Dr Olevia Perches was 08/22/06  . TEE WITHOUT CARDIOVERSION  04/15/2011   Procedure: TRANSESOPHAGEAL ECHOCARDIOGRAM (TEE);  Surgeon: Loralie Champagne, MD;  Location: [place];  Service: Cardiovascular;  Laterality: N/A;  . TEE WITHOUT CARDIOVERSION N/A 09/11/2014   Procedure: TRANSESOPHAGEAL ECHOCARDIOGRAM (TEE);   Surgeon: Sueanne Margarita, MD;  Location: [facility] ENDOSCOPY;  Service: Cardiovascular;  Laterality: N/A;     Family History  Problem Relation Age of Onset  . Heart disease Brother     Twin brother has coronary disease and recent AVR for AS  . Anesthesia problems Neg Hx   . Hypotension Neg Hx   . Malignant hyperthermia Neg Hx   . Pseudochol deficiency Neg Hx      Social History   Social History  . Marital status: Married    Spouse name: N/A  . Number of children: N/A  . Years of education: N/A   Occupational History  . Not on file.   Social History Main Topics  . Smoking status: Never Smoker  . Smokeless tobacco: Never Used  . Alcohol use 0.0 oz/week     Comment: Rarely.  . Drug use: No  . Sexual activity: Not on file  Other Topics Concern  . Not on file   Social History Narrative  . No narrative on file     BP 118/76   Pulse 82   Ht 6\' 2"  (1.88 m)   Wt 225 lb (102.1 kg)   BMI 28.89 kg/m   Physical Exam:  stable appearing 81 year old man, NAD HEENT: Unremarkable Neck:  7 cm JVD, no thyromegally Back:  No CVA tenderness Lungs:  Clear with no wheezes, rales, or rhonchi. HEART:  IRegular rhythm, split S2. Abd:  soft, positive bowel sounds, no organomegally, no rebound, no guarding Ext:  2 plus pulses, no edema, no cyanosis, no clubbing Skin:  No rashes no nodules Neuro:  CN II through XII intact, motor grossly intact  PPM interogation - atrial flutter with  Normal RV thresholds.   Assess/Plan:  1. Atrial fib/flutter/Pac's - he isback out of rhythm. I have discussed the treatment options in detail. Because he does not feel bad, he will continue with a strategy of rate control rather than rhythm control. He will call us if his ventricular rates cannot be controlled. 2. Acute diastolic heart failure - his symptoms are well compensated 3. PPM - he is s/p insertion of a DDD PM. His device is working normally. 4. CAD - he denies anginal symptoms except when he is  exerting himself and his rate is high. These have been controlled since he has been back in rhythm. Will follow.  [name].D.

## 2015-11-20 ENCOUNTER — Emergency Department (HOSPITAL_COMMUNITY): Payer: Medicare Other

## 2015-11-20 ENCOUNTER — Emergency Department (HOSPITAL_COMMUNITY)
Admission: EM | Admit: 2015-11-20 | Discharge: 2015-11-20 | Disposition: A | Discharge status: Home / Self Care | Payer: Medicare Other | Attending: Emergency Medicine | Admitting: Emergency Medicine

## 2015-11-20 ENCOUNTER — Telehealth: Payer: Self-pay | Admitting: *Deleted

## 2015-11-20 ENCOUNTER — Encounter (HOSPITAL_COMMUNITY): Payer: Self-pay | Admitting: Emergency Medicine

## 2015-11-20 DIAGNOSIS — Z950 Presence of cardiac pacemaker: Secondary | ICD-10-CM | POA: Insufficient documentation

## 2015-11-20 DIAGNOSIS — I480 Paroxysmal atrial fibrillation: Secondary | ICD-10-CM | POA: Diagnosis not present

## 2015-11-20 DIAGNOSIS — Z7901 Long term (current) use of anticoagulants: Secondary | ICD-10-CM | POA: Diagnosis not present

## 2015-11-20 DIAGNOSIS — I2510 Atherosclerotic heart disease of native coronary artery without angina pectoris: Secondary | ICD-10-CM | POA: Diagnosis not present

## 2015-11-20 DIAGNOSIS — E039 Hypothyroidism, unspecified: Secondary | ICD-10-CM | POA: Insufficient documentation

## 2015-11-20 DIAGNOSIS — Z79899 Other long term (current) drug therapy: Secondary | ICD-10-CM | POA: Insufficient documentation

## 2015-11-20 DIAGNOSIS — I4891 Unspecified atrial fibrillation: Secondary | ICD-10-CM | POA: Diagnosis not present

## 2015-11-20 DIAGNOSIS — Z8673 Personal history of transient ischemic attack (TIA), and cerebral infarction without residual deficits: Secondary | ICD-10-CM | POA: Insufficient documentation

## 2015-11-20 LAB — BASIC METABOLIC PANEL
Anion gap: 7 (ref 5–15)
BUN: 16 mg/dL (ref 6–20)
CHLORIDE: 104 mmol/L (ref 101–111)
CO2: 27 mmol/L (ref 22–32)
Calcium: 9.4 mg/dL (ref 8.9–10.3)
Creatinine, Ser: 1.09 mg/dL (ref 0.61–1.24)
GFR calc non Af Amer: >60 mL/min (ref >60)
Glucose, Bld: 134 mg/dL — ABNORMAL HIGH (ref 65–99)
POTASSIUM: 3.6 mmol/L (ref 3.5–5.1)
SODIUM: 138 mmol/L (ref 135–145)

## 2015-11-20 LAB — CBC
HEMATOCRIT: 38.4 % — AB (ref 39.0–52.0)
Hemoglobin: 12.1 g/dL — ABNORMAL LOW (ref 13.0–17.0)
MCH: 27.9 pg (ref 26.0–34.0)
MCHC: 31.5 g/dL (ref 30.0–36.0)
MCV: 88.7 fL (ref 78.0–100.0)
PLATELETS: 210 10*3/uL (ref 150–400)
RBC: 4.33 MIL/uL (ref 4.22–5.81)
RDW: 14 % (ref 11.5–15.5)
WBC: 7.5 10*3/uL (ref 4.0–10.5)

## 2015-11-20 MED ORDER — METOPROLOL TARTRATE 5 MG/5ML IV SOLN
5.0000 mg | Freq: Once | INTRAVENOUS | Status: DC
Start: 2015-11-20 — End: 2015-11-20

## 2015-11-20 NOTE — ED Provider Notes (Signed)
[facility] DEPT Provider Note   CSN: [id number] Arrival date & time: 11/20/15  0008  By signing my name below, I, [name], attest that this documentation has been prepared under the direction and in the presence of Orpah Greek, MD. Electronically Signed: Judithann Sauger, ED Scribe. 11/20/15. 12:44 AM.    History   Chief Complaint Chief Complaint  Patient presents with  . Atrial Fibrillation    HPI Comments: [name] is a 81 y.o. male with a hx of Paroxysmal A-fib, atrial flutter, chronotropic incompetence with sinus node dysfunction, and pacemaker who presents to the Emergency Department complaining of sudden onset of persistent episode of A-fib with heart flutters onset 1.5 hours ago. He states that he does not have any current pain. No alleviating factors noted. He reports that he tried 172 mg Metoprolol PTA with mild relief. Pt explains that he was last cardioverted on 10/10/15 and adds that he did not go back to full normal sinus after that. No fever, chills, chest pain, shortness of breath, headache, nausea, or vomiting.   The history is provided by the patient. No language interpreter was used.  Atrial Fibrillation  This is a chronic problem. The current episode started 1 to 2 hours ago. The problem has not changed since onset.Pertinent negatives include no chest pain, no headaches and no shortness of breath. Nothing aggravates the symptoms. Nothing relieves the symptoms. Treatments tried: Metropolol. The treatment provided mild relief.    Past Medical History:  Diagnosis Date  . Aortic stenosis    moderate aortic stenosis  . Arthritis   . Benign prostatic hypertrophy   . 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  . Coronary artery disease    status post multiple prior percutaneous coronary  interventions, microvascular angina per Dr Olevia Perches  . Heart murmur   . Hypercoagulable state ([place])    chronically anticoagulated with coumadin  . Hyperlipidemia   . Hyperthyroidism   . Hypothyroidism    Dr. Elyse Hsu  . MGUS (monoclonal gammopathy of unknown significance) 02/17/2013  . Paroxysmal atrial fibrillation ([place])    DR. Lia Foyer,   . Stroke [facility])    1990    Patient Active Problem List   Diagnosis Date Noted  . 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 Typical atrial flutter ([place])   . PVC's (premature ventricular contractions) 01/30/2015  . Pacemaker 04/30/2013  . MGUS (monoclonal gammopathy of unknown significance) 02/17/2013  . Orthostatic hypotension 04/11/2011  . Long term current use of anticoagulant therapy 03/24/2011  . Heart valve replaced by other means 03/24/2011  . S/P AVR 03/24/2011  . Pleural effusion 03/24/2011  . Hypothyroidism 11/22/2010  . Atrial flutter ([place]) 08/19/2010  . CHEST PAIN 04/06/2010  . HYPERLIPIDEMIA-MIXED 12/08/2007  . PRIMARY HYPERCOAGULABLE STATE 12/08/2007  . ANGINA, STABLE/EXERTIONAL 12/08/2007  . CAD, NATIVE VESSEL 12/08/2007  . AORTIC STENOSIS/ INSUFFICIENCY, NON-RHEUMATIC 12/08/2007  . Atrial fibrillation ([place]) 12/08/2007  . Chronotropic incompetence with sinus node dysfunction (HCC) 12/08/2007    Past Surgical History:  Procedure Laterality Date  . AORTIC VALVE REPLACEMENT  03/15/2011   Procedure: AORTIC VALVE REPLACEMENT (AVR);  Surgeon: Gaye Pollack, MD;  Location: [place];  Service: Open Heart Surgery;  Laterality: N/A;  . APPENDECTOMY    . CARDIAC CATHETERIZATION     11  . CARDIOVERSION    . CARDIOVERSION  04/15/2011   Procedure:  CARDIOVERSION;  Surgeon: Loralie Champagne, MD;  Location: [place];  Service: Cardiovascular;  Laterality: N/A;  . CARDIOVERSION N/A 09/11/2014   Procedure: CARDIOVERSION;  Surgeon: Sueanne Margarita, MD;  Location: [place] ENDOSCOPY;  Service: Cardiovascular;  Laterality: N/A;  . CARDIOVERSION N/A  06/27/2015   Procedure: CARDIOVERSION;  Surgeon: Thayer Headings, MD;  Location: [facility] ENDOSCOPY;  Service: Cardiovascular;  Laterality: N/A;  . CARDIOVERSION N/A 07/04/2015   Procedure: CARDIOVERSION;  Surgeon: Evans Lance, MD;  Location: [place];  Service: Cardiovascular;  Laterality: N/A;  . EP IMPLANTABLE DEVICE N/A 06/16/2015   Procedure: Pacemaker Implant;  Surgeon: Evans Lance, MD;  Location: [place] CV LAB;  Service: Cardiovascular;  Laterality: N/A;  . hemrrhoidectomy    . LEFT AND RIGHT HEART CATHETERIZATION WITH CORONARY ANGIOGRAM Bilateral 02/01/2011   Procedure: LEFT AND RIGHT HEART CATHETERIZATION WITH CORONARY ANGIOGRAM;  Surgeon: Hillary Bow, MD;  Location: [facility] CATH LAB;  Service: Cardiovascular;  Laterality: Bilateral;  . MAZE  03/15/2011   Procedure: MAZE;  Surgeon: Gaye Pollack, MD;  Location: [place];  Service: Open Heart Surgery;  Laterality: N/A;  . PACEMAKER INSERTION  1991   Guidant PPM, most recent Generator Change by Dr Olevia Perches was 08/22/06  . TEE WITHOUT CARDIOVERSION  04/15/2011   Procedure: TRANSESOPHAGEAL ECHOCARDIOGRAM (TEE);  Surgeon: Loralie Champagne, MD;  Location: [place];  Service: Cardiovascular;  Laterality: N/A;  . TEE WITHOUT CARDIOVERSION N/A 09/11/2014   Procedure: TRANSESOPHAGEAL ECHOCARDIOGRAM (TEE);  Surgeon: Sueanne Margarita, MD;  Location: [facility] ENDOSCOPY;  Service: Cardiovascular;  Laterality: N/A;       Home Medications    Prior to Admission medications   Medication Sig Start Date End Date Taking? Authorizing Provider  apixaban (ELIQUIS) 5 MG TABS tablet Take 1 tablet (5 mg total) by mouth 2 (two) times daily. 09/25/15   Sherren Mocha, MD  bisacodyl (DULCOLAX) 5 MG EC tablet Take 5 mg by mouth daily as needed (constipation).     Historical Provider, MD  butalbital-acetaminophen-caffeine (FIORICET, ESGIC) 50-325-40 MG per tablet Take 1 tablet by mouth 2 (two) times daily as needed (osteo-arthritis pain).  02/28/13   Historical Provider,  MD  Coenzyme Q10 (COQ10) 100 MG CAPS Take 100 mg by mouth daily.    Historical Provider, MD  Diclofenac Sodium 1.5 % SOLN Take 1 application by mouth 2 (two) times daily as needed (Knee pain).  02/28/13   Historical Provider, MD  doxazosin (CARDURA) 4 MG tablet Take 4 mg by mouth at bedtime.     Historical Provider, MD  furosemide (LASIX) 20 MG tablet Take 20 mg by mouth daily as needed for fluid.    Historical Provider, MD  HYDROcodone-acetaminophen (NORCO) 10-325 MG tablet Take 0.5-1 tablets by mouth 2 (two) times daily as needed for moderate pain or severe pain.  05/20/15   Historical Provider, MD  LORazepam (ATIVAN) 1 MG tablet Take 1 mg by mouth at bedtime.  01/05/12   Hillary Bow, MD  magnesium oxide (MAG-OX) 400 MG tablet Take 400 mg by mouth daily.    Historical Provider, MD  metoprolol succinate (TOPROL-XL) 100 MG 24 hr tablet Take 1 tablet (100 mg total) by mouth 2 (two) times daily. 07/04/15   Renee Dyane Dustman, PA-C  Multiple Vitamin (MULTIVITAMIN WITH MINERALS) TABS tablet Take 1 tablet by mouth daily.    Historical Provider, MD  nitroGLYCERIN (NITROSTAT) 0.4 MG SL tablet Place 1 tablet (0.4 mg total) under the tongue every 5 (five) minutes as needed  for chest pain. 11/14/15   Evans Lance, MD  OVER THE COUNTER MEDICATION Apply 1 patch topically daily as needed (neck and shoulder pain). Chinese pain patch    Historical Provider, MD  potassium chloride SA (KLOR-CON M20) 20 MEQ tablet Take 1 tablet (20 mEq total) by mouth daily. 05/30/15   Evans Lance, MD  predniSONE (DELTASONE) 10 MG tablet Take 1 tablet (10 mg total) by mouth daily with breakfast. 10/28/15   Alda Berthold, DO  predniSONE (DELTASONE) 5 MG tablet as directed. Also has a 10 mg tablet - take as directed 10/23/15   Historical Provider, MD  pyridostigmine (MESTINON) 60 MG tablet Take 1 tablet (60 mg total) by mouth 3 (three) times daily. 10/28/15   Donika Keith Rake, DO  rosuvastatin (CRESTOR) 20 MG tablet TAKE 1 TABLET BY MOUTH  AT BEDTIME. 09/25/15   Sherren Mocha, MD    Family History Family History  Problem Relation Age of Onset  . Heart disease Brother     Twin brother has coronary disease and recent AVR for AS  . Anesthesia problems Neg Hx   . Hypotension Neg Hx   . Malignant hyperthermia Neg Hx   . Pseudochol deficiency Neg Hx     Social History Social History  Substance Use Topics  . Smoking status: Never Smoker  . Smokeless tobacco: Never Used  . Alcohol use 0.0 oz/week     Comment: Rarely.     Allergies   Contrast media [iodinated diagnostic agents]   Review of Systems Review of Systems  Constitutional: Negative for chills and fever.  Respiratory: Negative for shortness of breath.   Cardiovascular: Positive for palpitations. Negative for chest pain.  Gastrointestinal: Negative for nausea and vomiting.  Neurological: Negative for headaches.  All other systems reviewed and are negative.    Physical Exam Updated Vital Signs BP 127/78   Pulse 66   Temp 97.7 F (36.5 C) (Oral)   Resp 18   SpO2 96%   Physical Exam  Constitutional: He is oriented to person, place, and time. He appears well-developed and well-nourished. No distress.  HENT:  Head: Normocephalic and atraumatic.  Right Ear: Hearing normal.  Left Ear: Hearing normal.  Nose: Nose normal.  Mouth/Throat: Oropharynx is clear and moist and mucous membranes are normal.  Eyes: Conjunctivae and EOM are normal. Pupils are equal, round, and reactive to light.  Neck: Normal range of motion. Neck supple.  Cardiovascular: S1 normal and S2 normal.  An irregularly irregular rhythm present. Tachycardia present.  Exam reveals no gallop and no friction rub.   No murmur heard. Pulmonary/Chest: Effort normal and breath sounds normal. No respiratory distress. He exhibits no tenderness.  Abdominal: Soft. Normal appearance and bowel sounds are normal. There is no hepatosplenomegaly. There is no tenderness. There is no rebound, no guarding,  no tenderness at McBurney's point and negative Murphy's sign. No hernia.  Musculoskeletal: Normal range of motion.  Neurological: He is alert and oriented to person, place, and time. He has normal strength. No cranial nerve deficit or sensory deficit. Coordination normal. GCS eye subscore is 4. GCS verbal subscore is 5. GCS motor subscore is 6.  Skin: Skin is warm, dry and intact. No rash noted. No cyanosis.  Psychiatric: He has a normal mood and affect. His speech is normal and behavior is normal. Thought content normal.  Nursing note and vitals reviewed.    ED Treatments / Results  DIAGNOSTIC STUDIES: Oxygen Saturation is 95% on RA, adequate by  my interpretation.    COORDINATION OF CARE: 12:54 AM- Pt advised of plan for treatment and pt agrees. Pt will receive lab work, chest x-ray, and EKG for further evaluation. He will also receive 5 mg metoprolol.    Labs (all labs ordered are listed, but only abnormal results are displayed) Labs Reviewed  BASIC METABOLIC PANEL - Abnormal; Notable for the following:       Result Value   Glucose, Bld 134 (*)    All other components within normal limits  CBC - Abnormal; Notable for the following:    Hemoglobin 12.1 (*)    HCT 38.4 (*)    All other components within normal limits    EKG  EKG Interpretation  Date/Time:  Thursday November 20 2015 00:10:06 EDT Ventricular Rate:  115 PR Interval:    QRS Duration: 134 QT Interval:  382 QTC Calculation: 528 R Axis:   -68 Text Interpretation:  Atrial flutter with variable A-V block with premature ventricular or aberrantly conducted complexes Right bundle branch block Left anterior fascicular block Bifascicular block  Left ventricular hypertrophy with repolarization abnormality Abnormal ECG Confirmed by Chandrika Sandles  MD, Roald Lukacs [id number]) on 11/20/2015 12:26:04 AM       Radiology No results found.  Procedures Procedures (including critical care time)  Medications Ordered in ED Medications -  No data to display   Initial Impression / Assessment and Plan / ED Course  Orpah Greek, MD has reviewed the triage vital signs and the nursing notes.  Pertinent labs & imaging results that were available during my care of the patient were reviewed by me and considered in my medical decision making (see chart for details).  Clinical Course    Patient presents to the emergency department for evaluation of increased heart rate. He did take additional Lopressor prior to coming to the ER and his heart rate is coming down. Reviewing records from his visit to cardiology on the 15th of this month reveals that he is now in chronic A. fib. He is taking Eliquis and is therefore adequately anticoagulated. Patient was monitored here in the ER. Basic labs are unremarkable. He is not experiencing any chest pain or shortness of breath. Examination is otherwise unremarkable. Heart rate is now in the 70s and 80s and he is therefore appropriate for discharge. He will contact Dr. Lovena Le tomorrow.  Final Clinical Impressions(s) / ED Diagnoses   Final diagnoses:  Atrial fibrillation with RVR (HCC)    New Prescriptions New Prescriptions   No medications on file   I personally performed the services described in this documentation, which was scribed in my presence. The recorded information has been reviewed and is accurate.'   Orpah Greek, MD 11/20/15 [phone]

## 2015-11-20 NOTE — ED Notes (Signed)
Patient transported to X-ray 

## 2015-11-20 NOTE — Telephone Encounter (Signed)
Patient called to ask if his Latitude monitor is working.  He sent three transmissions overnight due to A-flutter with V rates into the 130s and 140s.  Patient ultimately went to the ED due to these heart rates.  He was discharged early this morning and is feeling much better.  Advised patient that his monitor is transmitting but that it does not replace the ED overnight or on the weekend.  Had long discussion with patient about the purpose of his home monitor and he verbalizes understanding of its purpose.  Patient is going to follow-up with an EP at [facility] next week.  He requests copy of his Holter report, most recent device check, and Dr. Tanna Furry most recent note to bring to this appointment with him.  He plans to come in on Monday to pick up this information.  Patient agrees to fill out records request form at this time.  Patient is appreciative of assistance and denies additional questions or concerns at this time.

## 2015-11-20 NOTE — ED Triage Notes (Signed)
Pt reports AFIB with RVR starting at 2300 while walking up the steps. Pt alert, no diaphoresis. Denies pain at this time, ambulatory. Ativan 1.5 MG ativan and 175 MG metoprolol PTA.

## 2015-11-20 NOTE — ED Notes (Signed)
Pt departed in NAD.  

## 2015-11-24 ENCOUNTER — Encounter: Payer: Medicare Other | Admitting: *Deleted

## 2015-11-26 MED FILL — DOXAZOSIN MESYLATE 4 MG TAB: 4 | 90 days supply | Qty: 90 | Fill #0

## 2015-11-27 DIAGNOSIS — Z953 Presence of xenogenic heart valve: Secondary | ICD-10-CM | POA: Diagnosis not present

## 2015-11-27 DIAGNOSIS — I484 Atypical atrial flutter: Secondary | ICD-10-CM | POA: Diagnosis not present

## 2015-11-27 DIAGNOSIS — I2510 Atherosclerotic heart disease of native coronary artery without angina pectoris: Secondary | ICD-10-CM | POA: Diagnosis not present

## 2015-11-27 DIAGNOSIS — I951 Orthostatic hypotension: Secondary | ICD-10-CM | POA: Diagnosis not present

## 2015-11-27 DIAGNOSIS — Z950 Presence of cardiac pacemaker: Secondary | ICD-10-CM | POA: Diagnosis not present

## 2015-11-27 DIAGNOSIS — E782 Mixed hyperlipidemia: Secondary | ICD-10-CM | POA: Diagnosis not present

## 2015-11-27 DIAGNOSIS — D472 Monoclonal gammopathy: Secondary | ICD-10-CM | POA: Diagnosis not present

## 2015-11-28 MED FILL — BUTALB-ACETAMIN-CAFF 50-325: 50-325-40 | 90 days supply | Qty: 180 | Fill #0

## 2015-12-02 ENCOUNTER — Ambulatory Visit (INDEPENDENT_AMBULATORY_CARE_PROVIDER_SITE_OTHER): Payer: Medicare Other | Admitting: Neurology

## 2015-12-02 ENCOUNTER — Encounter: Payer: Self-pay | Admitting: Neurology

## 2015-12-02 VITALS — BP 144/86 | HR 70 | Temp 97.4°F

## 2015-12-02 DIAGNOSIS — G7000 Myasthenia gravis without (acute) exacerbation: Secondary | ICD-10-CM | POA: Diagnosis not present

## 2015-12-02 NOTE — Patient Instructions (Addendum)
1.  Continue prednisone 10mg  daily 2.  Continue mestinon 60mg  three times daily 3.  Call with an update in 3 weeks

## 2015-12-02 NOTE — Progress Notes (Signed)
Follow-up Visit   Date: 12/02/15    [name] MRN: [id number] DOB: Dec 26, 1934   Interim History: [name] is a 81 y.o. right-handed male returning to the clinic for follow-up of ocular myasthenia gravis.  His has multiple medical comorbidities including hypertension, amiodarone-induced hypothyroidism and pulmonary fibrosis, cerebellar stroke (1989, age 31), sinus node dysfunction s/p PPM, CAD s/p multiple interventions, PAF on chronic anticoagulation with Eliquis, and is s/p AVR for aortic stenosis with MAZE.  History of present illness: He reports having history of fluctuating and fatigable bilateral ptosis, greater on the right, and binocular diplopia since 2010.  In 2014, his acetylcholine receptor antibodies returned negative.  Because of high clinical suspicion for myasthenia, he was referred to [facility] for SFEMG which showed jitter in the left orbicularis oculi.  The diagnosis of seronegative myasthenia gravis was made and he was started on mestinon 30-60mg  TID, however, admits that he did not take this very long.  CT chest was negative for thymoma.  Over the years, he has simply learned to compensate with his vertical diplopia and ptosis and remained off any medications. There has been no change in his ocular symptoms since onset.  He does not have any difficulty with day-to-day activities, including driving.   Recently, he began having increased dyspnea with exertion and greater bilateral leg fatigue and achy pain, which led to him thinking that his myasthenia may be getting worse.  He was able to walk a greater distance, but now gets very short of breath and needs to rest.  With this said, he also acknowledges his significant cardiac comorbidities and is unsure whether his MG is getting worse or if his symptoms are stemming from cardiac and pulmonary disease.  He had mild dysphagia and had barium swallow which showed slowed peristalsis.  He has a reclining bed  and tends to keep the head up, but this has been the same for many years.   He does not have slurred speech, proximal weakness, or falls.    He is mostly bothered by his cardiac arrhythmias, especially when he reverts back into atrial flutter with tachycardia because it causes chest tightness and angina.  Because this can be triggered by a number of things, he has been less active as to avoid increasing his heart rate and does feel deconditioned.  He also has significant arthritis involving his neck, shoulder, hands, hips, and knees for which he sees Dr. Estanislado Pandy.  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  He tried prednisone 20mg  for about 10-days without benefit, so self tapering it back to 10mg  daily.  He denies any new neurological symptoms.  Unfortunately, he had an interval ER visit on 11/20/2015 for afib/flutter with RVR which was medically managed.  He will be going to [facility] for evaluation for cardiac ablation.    Medications:  Current Outpatient Prescriptions on File Prior to Visit  Medication Sig Dispense Refill  . apixaban (ELIQUIS) 5 MG TABS tablet Take 1 tablet (5 mg total) by mouth 2 (two) times daily. 180 tablet 3  . bisacodyl (DULCOLAX) 5 MG EC tablet Take 5 mg by mouth daily as needed (constipation).     . butalbital-acetaminophen-caffeine (FIORICET, ESGIC) 50-325-40 MG per tablet Take 1 tablet by mouth 2 (two) times daily as needed (osteo-arthritis pain).     . Coenzyme Q10 (COQ10) 100 MG CAPS Take 100 mg by mouth daily.    Marland Kitchen  Diclofenac Sodium 1.5 % SOLN Take 1 application by mouth 2 (two) times daily as needed (Knee pain).     Marland Kitchen doxazosin (CARDURA) 4 MG tablet Take 4 mg by mouth at bedtime.     . furosemide (LASIX) 20 MG tablet Take 20 mg by mouth daily as needed for fluid.    Marland Kitchen HYDROcodone-acetaminophen (NORCO) 10-325 MG tablet Take 0.5-1 tablets by mouth 2 (two) times daily as needed for  moderate pain or severe pain.   0  . LORazepam (ATIVAN) 1 MG tablet Take 1 mg by mouth at bedtime.     . magnesium oxide (MAG-OX) 400 MG tablet Take 400 mg by mouth daily.    . metoprolol succinate (TOPROL-XL) 100 MG 24 hr tablet Take 1 tablet (100 mg total) by mouth 2 (two) times daily. 180 tablet 1  . Multiple Vitamin (MULTIVITAMIN WITH MINERALS) TABS tablet Take 1 tablet by mouth daily.    . nitroGLYCERIN (NITROSTAT) 0.4 MG SL tablet Place 1 tablet (0.4 mg total) under the tongue every 5 (five) minutes as needed for chest pain. 25 tablet 3  . OVER THE COUNTER MEDICATION Apply 1 patch topically daily as needed (neck and shoulder pain). Chinese pain patch    . potassium chloride SA (KLOR-CON M20) 20 MEQ tablet Take 1 tablet (20 mEq total) by mouth daily. 90 tablet 3  . predniSONE (DELTASONE) 10 MG tablet Take 1 tablet (10 mg total) by mouth daily with breakfast. 30 tablet 5  . predniSONE (DELTASONE) 5 MG tablet as directed. Also has a 10 mg tablet - take as directed    . pyridostigmine (MESTINON) 60 MG tablet Take 1 tablet (60 mg total) by mouth 3 (three) times daily. 90 tablet 5  . rosuvastatin (CRESTOR) 20 MG tablet TAKE 1 TABLET BY MOUTH AT BEDTIME. 90 tablet 3   No current facility-administered medications on file prior to visit.     Allergies:  Allergies  Allergen Reactions  . Contrast Media [Iodinated Diagnostic Agents] Hives    Review of Systems:  CONSTITUTIONAL: No fevers, chills, night sweats, or weight loss.  EYES: No visual changes or eye pain ENT: No hearing changes.  No history of nose bleeds.   RESPIRATORY: No cough, wheezing and shortness of breath.   CARDIOVASCULAR: Negative for chest pain, and palpitations.   GI: Negative for abdominal discomfort, blood in stools or black stools.  No recent change in bowel habits.   GU:  No history of incontinence.   MUSCLOSKELETAL: +history of joint pain or swelling.  No myalgias.   SKIN: Negative for lesions, rash, and itching.     ENDOCRINE: Negative for cold or heat intolerance, polydipsia or goiter.   PSYCH:  No depression or anxiety symptoms.   NEURO: As Above.   Vital Signs:  BP (!) 144/86   Pulse 70   Temp 97.4 F (36.3 C) (Oral)   SpO2 (!) 70%   Neurological Exam: MENTAL STATUS including orientation to time, place, person, recent and remote memory, attention span and concentration, language, and fund of knowledge is normal.  Speech is not dysarthric.  CRANIAL NERVES:  Pupils are small equal round and reactive to light.  There is mild right upgaze restriction, otherwise normal conjugate, extra-ocular eye movements in all directions of gaze.  Moderate right ptosis at rest worse with upgaze.  Orbicularis oculi and buccinator muscles are intact.  Palate elevates symmetrically.  Tongue is midline.  MOTOR:  Motor strength is 5/5 in all extremities.  COORDINATION/GAIT:  Gait narrow based and stable.   Data: CTA head 05/17/2012: Old left inferior cerebellar infarction. No abnormality of the cerebral hemispheres. Atherosclerosis of both carotid siphon regions without evidence of flow-limiting stenosis. Narrowing is no greater than about 25-30% in that region. The right vertebral artery is patent to the basilar. There is mild atherosclerotic narrowing of the distal 1 cm without evidence of a critical stenosis. The left vertebral artery is a tiny vessel. This could be antegrade flow, reconstituted flow or retrograde flow. See above for full discussion. There is no evidence of a dominant or correctable posterior circulation stenosis.   IMPRESSION/PLAN: Seronegative ocular myasthenia gravis with exacerbation  - Diagnosed by [facility]G at [facility] by Dr. Queen Slough in 2014  - He has not been on mestinon or immunomodulatory drugs since 2014 and was managing symptoms by covering one eye  - He established care with me in August 2017 and I started mestinon 60mg  TID and prednisone 10mg  daily which was briefly increased  to 20mg , but has not noticed any benefit.  I do not think that he was on prednisone 20mg  for sufficient time to see lasting benefit  - At this juncture, his cardiac problems are a much greater priority and with his upcoming ablation, I do not want to make any changes to his medication.  He will continue mesntinon 60mg  TID and prednisone 10mg  daily.  Following his procedure, we can further titrate his prednisone to 20-30mg  daily to see how he responds. Long term side effects of corticosteroids were discussed.   The duration of this appointment visit was 30 minutes of face-to-face time with the patient.  Greater than 50% of this time was spent in counseling, explanation of diagnosis, planning of further management, and coordination of care.   Thank you for allowing me to participate in patient's care.  If I can answer any additional questions, I would be pleased to do so.    Sincerely,    Gordon Carlson K. Posey Pronto, DO

## 2015-12-03 NOTE — Progress Notes (Signed)
Note sent to Dr. Estanislado Pandy.

## 2015-12-09 ENCOUNTER — Ambulatory Visit: Payer: Medicare Other | Admitting: Neurology

## 2015-12-10 ENCOUNTER — Telehealth: Payer: Self-pay | Admitting: Internal Medicine

## 2015-12-10 NOTE — Telephone Encounter (Signed)
[name] is calling because they received the device back w/ no data on it for this patient . Please call    Thanks

## 2015-12-10 NOTE — Telephone Encounter (Signed)
Monitor was shipped back to [facility] because it malfunctioned back in July when Dr. Velora Heckler wore it. His holter monitor was repeated using a monitor from [facility] instead.  The results of this study have been finalized in EPIC.  I will contact [employer] to reiterate this was a malfunctioning monitor.

## 2015-12-11 DIAGNOSIS — I4430 Unspecified atrioventricular block: Secondary | ICD-10-CM | POA: Diagnosis not present

## 2015-12-11 DIAGNOSIS — R9431 Abnormal electrocardiogram [ECG] [EKG]: Secondary | ICD-10-CM | POA: Diagnosis not present

## 2015-12-11 DIAGNOSIS — I951 Orthostatic hypotension: Secondary | ICD-10-CM | POA: Diagnosis not present

## 2015-12-11 DIAGNOSIS — I484 Atypical atrial flutter: Secondary | ICD-10-CM | POA: Diagnosis not present

## 2015-12-11 DIAGNOSIS — Z950 Presence of cardiac pacemaker: Secondary | ICD-10-CM | POA: Diagnosis not present

## 2015-12-11 DIAGNOSIS — I452 Bifascicular block: Secondary | ICD-10-CM | POA: Diagnosis not present

## 2015-12-15 ENCOUNTER — Encounter: Payer: Self-pay | Admitting: Internal Medicine

## 2015-12-15 ENCOUNTER — Ambulatory Visit (INDEPENDENT_AMBULATORY_CARE_PROVIDER_SITE_OTHER)
Admission: RE | Admit: 2015-12-15 | Discharge: 2015-12-15 | Disposition: A | Discharge status: Home / Self Care | Payer: Medicare Other | Source: Ambulatory Visit | Attending: Internal Medicine | Admitting: Internal Medicine

## 2015-12-15 ENCOUNTER — Ambulatory Visit (INDEPENDENT_AMBULATORY_CARE_PROVIDER_SITE_OTHER): Payer: Medicare Other | Admitting: Internal Medicine

## 2015-12-15 VITALS — BP 110/68 | HR 72 | Ht 74.0 in | Wt 225.0 lb

## 2015-12-15 DIAGNOSIS — R10814 Left lower quadrant abdominal tenderness: Secondary | ICD-10-CM

## 2015-12-15 DIAGNOSIS — K5732 Diverticulitis of large intestine without perforation or abscess without bleeding: Secondary | ICD-10-CM | POA: Diagnosis not present

## 2015-12-15 MED ORDER — AMOXICILLIN-POT CLAVULANATE 875-125 MG PO TABS: 1.0000 | ORAL_TABLET | Freq: Two times a day (BID) | ORAL | 1 refills | Status: DC

## 2015-12-15 MED FILL — AMOX TR-K CLV 875-125 MG TA: 875-125 | 10 days supply | Qty: 20 | Fill #0

## 2015-12-15 NOTE — Progress Notes (Signed)
[name] 81 y.o. 10/11/1934 [id number]  Assessment & Plan:   Encounter Diagnoses  Name Primary?  . Diverticulitis of colon Yes  . Left lower quadrant abdominal tenderness without rebound tenderness but + involuntary guarding     Seems like diverticulitis - clinical dx only so far over the yrs - ? Undertreated w/ 100 mg cipro dose Complicated.  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 He started 100 mg bid cipro 5-6 d ago and then added metronidazole in past 2-3 d. Was in [place] for family visit past few days and 1-2 d ago pain very severe - maybe a little better today. Scanty small bowel movments - not much in past few days. Not eating solid food - using liquids, Boost, etc. Appetitie is off, + subjective fevers. Some borborygmi at times. Remote CT colonoscopy 2004 - 6 mm polyp, diverticulosis  Allergies  Allergen Reactions  . Contrast Media [Iodinated Diagnostic Agents] Hives   Outpatient Medications Prior to Visit  Medication Sig Dispense Refill  . apixaban (ELIQUIS) 5 MG TABS tablet Take 1 tablet (5 mg total) by mouth 2 (two) times daily. 180 tablet 3  . bisacodyl (DULCOLAX) 5 MG EC tablet Take 5 mg by mouth daily as needed (constipation).     . butalbital-acetaminophen-caffeine (FIORICET, ESGIC) 50-325-40 MG per tablet Take 1 tablet by mouth 2 (two) times daily as needed (osteo-arthritis pain).     . Coenzyme Q10 (COQ10) 100 MG CAPS Take 100 mg by mouth daily.    . Diclofenac Sodium 1.5 % SOLN Take 1  application by mouth 2 (two) times daily as needed (Knee pain).     Marland Kitchen doxazosin (CARDURA) 4 MG tablet Take 4 mg by mouth at bedtime.     . furosemide (LASIX) 20 MG tablet Take 20 mg by mouth daily as needed for fluid.    Marland Kitchen HYDROcodone-acetaminophen (NORCO) 10-325 MG tablet Take 0.5-1 tablets by mouth 2 (two) times daily as needed for moderate pain or severe pain.   0  . LORazepam (ATIVAN) 1 MG tablet Take 1 mg by mouth at bedtime.     . magnesium oxide (MAG-OX) 400 MG tablet Take 400 mg by mouth daily.    . metoprolol succinate (TOPROL-XL) 100 MG 24 hr tablet Take 1 tablet (100 mg total) by mouth 2 (two) times daily. 180 tablet 1  . Multiple Vitamin (MULTIVITAMIN WITH MINERALS) TABS tablet Take 1 tablet by mouth daily.    . nitroGLYCERIN (NITROSTAT) 0.4 MG SL tablet Place 1 tablet (0.4 mg total) under the tongue every 5 (five) minutes as needed for chest pain. 25 tablet 3  . OVER THE COUNTER MEDICATION Apply 1 patch topically daily as needed (neck and shoulder pain). Chinese pain patch    . potassium chloride SA (KLOR-CON M20) 20 MEQ tablet Take 1 tablet (20 mEq total) by mouth daily. 90 tablet 3  . predniSONE (DELTASONE) 10 MG tablet Take 1 tablet (10 mg total) by mouth daily with  breakfast. 30 tablet 5  . predniSONE (DELTASONE) 5 MG tablet as directed. Also has a 10 mg tablet - take as directed    . pyridostigmine (MESTINON) 60 MG tablet Take 1 tablet (60 mg total) by mouth 3 (three) times daily. 90 tablet 5  . rosuvastatin (CRESTOR) 20 MG tablet TAKE 1 TABLET BY MOUTH AT BEDTIME. 90 tablet 3   No facility-administered medications prior to visit.    Past Medical History:  Diagnosis Date  . Aortic stenosis    moderate aortic stenosis  . Arthritis   . Benign prostatic hypertrophy   . 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  . Coronary  artery disease    status post multiple prior percutaneous coronary interventions, microvascular angina per Dr Olevia Perches  . Heart murmur   . Hypercoagulable state ([place])    chronically anticoagulated with coumadin  . Hyperlipidemia   . Hyperthyroidism   . Hypothyroidism    Dr. Elyse Hsu  . MGUS (monoclonal gammopathy of unknown significance) 02/17/2013  . Paroxysmal atrial fibrillation ([place])    DR. Lia Foyer,   . Stroke [facility])    1990   Past Surgical History:  Procedure Laterality Date  . AORTIC VALVE REPLACEMENT  03/15/2011   Procedure: AORTIC VALVE REPLACEMENT (AVR);  Surgeon: Gaye Pollack, MD;  Location: [place];  Service: Open Heart Surgery;  Laterality: N/A;  . APPENDECTOMY    . CARDIAC CATHETERIZATION     11  . CARDIOVERSION    . CARDIOVERSION  04/15/2011   Procedure: CARDIOVERSION;  Surgeon: Loralie Champagne, MD;  Location: [place];  Service: Cardiovascular;  Laterality: N/A;  . CARDIOVERSION N/A 09/11/2014   Procedure: CARDIOVERSION;  Surgeon: Sueanne Margarita, MD;  Location: [facility] ENDOSCOPY;  Service: Cardiovascular;  Laterality: N/A;  . CARDIOVERSION N/A 06/27/2015   Procedure: CARDIOVERSION;  Surgeon: Thayer Headings, MD;  Location: [facility] ENDOSCOPY;  Service: Cardiovascular;  Laterality: N/A;  . CARDIOVERSION N/A 07/04/2015   Procedure: CARDIOVERSION;  Surgeon: Evans Lance, MD;  Location: [place];  Service: Cardiovascular;  Laterality: N/A;  . EP IMPLANTABLE DEVICE N/A 06/16/2015   Procedure: Pacemaker Implant;  Surgeon: Evans Lance, MD;  Location: [place] CV LAB;  Service: Cardiovascular;  Laterality: N/A;  . hemrrhoidectomy    . LEFT AND RIGHT HEART CATHETERIZATION WITH CORONARY ANGIOGRAM Bilateral 02/01/2011   Procedure: LEFT AND RIGHT HEART CATHETERIZATION WITH CORONARY ANGIOGRAM;  Surgeon: Hillary Bow, MD;  Location: [facility] CATH LAB;  Service: Cardiovascular;  Laterality: Bilateral;  . MAZE  03/15/2011   Procedure: MAZE;  Surgeon: Gaye Pollack, MD;  Location: [place];   Service: Open Heart Surgery;  Laterality: N/A;  . PACEMAKER INSERTION  1991   Guidant PPM, most recent Generator Change by Dr Olevia Perches was 08/22/06  . TEE WITHOUT CARDIOVERSION  04/15/2011   Procedure: TRANSESOPHAGEAL ECHOCARDIOGRAM (TEE);  Surgeon: Loralie Champagne, MD;  Location: [place];  Service: Cardiovascular;  Laterality: N/A;  . TEE WITHOUT CARDIOVERSION N/A 09/11/2014   Procedure: TRANSESOPHAGEAL ECHOCARDIOGRAM (TEE);  Surgeon: Sueanne Margarita, MD;  Location: [facility] ENDOSCOPY;  Service: Cardiovascular;  Laterality: N/A;   Social History   Social History  . Marital status: Married    Spouse name: N/A  . Number of children: N/A  . Years of education: N/A   Social History Main Topics  . Smoking status: Never Smoker  . Smokeless tobacco:  Never Used  . Alcohol use 0.0 oz/week     Comment: Rarely.  . Drug use: No   Social History Narrative   Married. Has grown children   Retired Horticulturist, commercial MD         Family History  Problem Relation Age of Onset  . Heart disease Brother     Twin brother has coronary disease and recent AVR for AS  . Anesthesia problems Neg Hx   . Hypotension Neg Hx   . Malignant hyperthermia Neg Hx   . Pseudochol deficiency Neg Hx      Review of Systems Chronic low back pain, no new urinary sxs - some chronically reduced stream Was to have a cardiac ablation for A fib today at [facility] but cancelled  Objective:   Physical Exam BP 110/68 (BP Location: Left Arm, Patient Position: Sitting, Cuff Size: Normal)   Pulse 72   Ht 6\' 2"  (1.88 m)   Wt 225 lb (102.1 kg)   BMI 28.89 kg/m  NAD anicetric eyes Lungs clear ant Heart S1S2 no rmg Abd: Mod tender sl guarding LLQ and tender to percussion but otherwise soft, NT BS + Neuro/psiych: - appropriate mood and affect - oriented x 3

## 2015-12-15 NOTE — Patient Instructions (Addendum)
   We have sent the following medications to your pharmacy for you to pick up at your convenience: Augmentin   You have been scheduled for a CT scan of the abdomen and pelvis at [facility] ([address]---this is in the same building as [facility]).   You are scheduled on 12/15/15 at 4:30pm. You should arrive 15 minutes prior to your appointment time for registration. Please follow the written instructions below on the day of your exam:  WARNING: IF YOU ARE ALLERGIC TO IODINE/X-RAY DYE, PLEASE NOTIFY RADIOLOGY IMMEDIATELY AT [phone]! YOU WILL BE GIVEN A 13 HOUR PREMEDICATION PREP.  1) Do not eat or drink anything after 12:30pm (4 hours prior to your test) 2) You have been given 2 bottles of oral contrast to drink. The solution may taste better if refrigerated, but do NOT add ice or any other liquid to this solution. Shake well before drinking.    Drink 1 bottle of contrast @ 2:30pm (2 hours prior to your exam)  Drink 1 bottle of contrast @ 3:30pm (1 hour prior to your exam)  You may take any medications as prescribed with a small amount of water except for the following: Metformin, Glucophage, Glucovance, Avandamet, Riomet, Fortamet, Actoplus Met, Janumet, Glumetza or Metaglip. The above medications must be held the day of the exam AND 48 hours after the exam.  The purpose of you drinking the oral contrast is to aid in the visualization of your intestinal tract. The contrast solution may cause some diarrhea. Before your exam is started, you will be given a small amount of fluid to drink. Depending on your individual set of symptoms, you may also receive an intravenous injection of x-ray contrast/dye. Plan on being at [facility] for 30 minutes or longer, depending on the type of exam you are having performed.  This test typically takes 30-45 minutes to complete.  If you have any questions regarding your exam or if you need to reschedule, you may call the CT  department at [phone] between the hours of 8:00 am and 5:00 pm, Monday-Friday.  ________________________________________________________________________    I appreciate the opportunity to care for you. Silvano Rusk, MD, [facility]

## 2015-12-16 NOTE — Progress Notes (Signed)
Please let Dr. Lorin Picket know CT confirms diverticulitis - no complications Hopefully Augmentin will fix things Try MiraLax for constipation  Will check in with him later in week by phone  Call back sooner prn

## 2015-12-19 NOTE — Progress Notes (Signed)
I called him - he is improved Will finish course of augmentin Has 1 RF for attacks if recur See me prn

## 2015-12-23 ENCOUNTER — Other Ambulatory Visit: Payer: Self-pay | Admitting: Oncology

## 2015-12-23 DIAGNOSIS — D472 Monoclonal gammopathy: Secondary | ICD-10-CM

## 2015-12-24 ENCOUNTER — Ambulatory Visit: Payer: Medicare Other | Admitting: Neurology

## 2015-12-24 ENCOUNTER — Other Ambulatory Visit (INDEPENDENT_AMBULATORY_CARE_PROVIDER_SITE_OTHER): Payer: Medicare Other

## 2015-12-24 DIAGNOSIS — D472 Monoclonal gammopathy: Secondary | ICD-10-CM | POA: Diagnosis not present

## 2015-12-26 DIAGNOSIS — R350 Frequency of micturition: Secondary | ICD-10-CM | POA: Diagnosis not present

## 2015-12-26 DIAGNOSIS — R3915 Urgency of urination: Secondary | ICD-10-CM | POA: Diagnosis not present

## 2015-12-26 DIAGNOSIS — N401 Enlarged prostate with lower urinary tract symptoms: Secondary | ICD-10-CM | POA: Diagnosis not present

## 2015-12-26 LAB — CBC WITH DIFFERENTIAL/PLATELET
BASOS ABS: 0 10*3/uL (ref 0.0–0.2)
Basos: 0 %
EOS (ABSOLUTE): 0.1 10*3/uL (ref 0.0–0.4)
Eos: 2 %
HEMOGLOBIN: 12 g/dL — AB (ref 12.6–17.7)
Hematocrit: 37.2 % — ABNORMAL LOW (ref 37.5–51.0)
Immature Grans (Abs): 0 10*3/uL (ref 0.0–0.1)
Immature Granulocytes: 0 %
LYMPHS ABS: 0.9 10*3/uL (ref 0.7–3.1)
Lymphs: 12 %
MCH: 28.4 pg (ref 26.6–33.0)
MCHC: 32.3 g/dL (ref 31.5–35.7)
MCV: 88 fL (ref 79–97)
MONOS ABS: 0.4 10*3/uL (ref 0.1–0.9)
Monocytes: 5 %
NEUTROS ABS: 6.1 10*3/uL (ref 1.4–7.0)
Neutrophils: 81 %
Platelets: 246 10*3/uL (ref 150–379)
RBC: 4.23 x10E6/uL (ref 4.14–5.80)
RDW: 13.9 % (ref 12.3–15.4)
WBC: 7.5 10*3/uL (ref 3.4–10.8)

## 2015-12-26 LAB — IMMUNOFIXATION ELECTROPHORESIS
IGM (IMMUNOGLOBULIN M), SRM: 731 mg/dL — AB (ref 15–143)
IgA/Immunoglobulin A, Serum: 156 mg/dL (ref 61–437)
IgG (Immunoglobin G), Serum: 832 mg/dL (ref 700–1600)
TOTAL PROTEIN: 6.7 g/dL (ref 6.0–8.5)

## 2015-12-26 LAB — KAPPA/LAMBDA LIGHT CHAINS
IG KAPPA FREE LIGHT CHAIN: 20.5 mg/L — AB (ref 3.3–19.4)
Ig Lambda Free Light Chain: 29.6 mg/L — ABNORMAL HIGH (ref 5.7–26.3)
KAPPA/LAMBDA FLC RATIO: 0.69 (ref 0.26–1.65)

## 2015-12-26 MED FILL — predniSONE 10 MG TABS: 10 | 30 days supply | Qty: 30 | Fill #1

## 2015-12-26 MED FILL — AMOX TR-K CLV 875-125 MG TA: 875-125 | 10 days supply | Qty: 20 | Fill #1

## 2015-12-26 MED FILL — ROSUVASTATIN CALCIUM 20 MG: 20 | 90 days supply | Qty: 90 | Fill #1 | Status: TO

## 2015-12-31 ENCOUNTER — Telehealth: Payer: Self-pay | Admitting: Rheumatology

## 2015-12-31 ENCOUNTER — Telehealth: Payer: Self-pay | Admitting: Radiology

## 2015-12-31 MED FILL — LORazepam 1 MG TABS: 1 | 90 days supply | Qty: 180 | Fill #0

## 2015-12-31 NOTE — Telephone Encounter (Signed)
Patient needs a refill of Hydrocodone

## 2015-12-31 NOTE — Telephone Encounter (Signed)
Patient needs follow up appt with Dr Deveshwar pls call to make appt. Feb 

## 2015-12-31 NOTE — Telephone Encounter (Signed)
10/22/15 last visit next visit Not sch, message sent for appt scheduling UDS 10/19/15 narcotic agreement on file/ current ok to refill Hydrocodone ?

## 2016-01-01 ENCOUNTER — Other Ambulatory Visit: Payer: Self-pay | Admitting: Radiology

## 2016-01-01 MED ORDER — HYDROCODONE-ACETAMINOPHEN 5-325 MG PO TABS: 1.0000 | ORAL_TABLET | Freq: Two times a day (BID) | ORAL | 0 refills | Status: DC | PRN

## 2016-01-01 MED ORDER — HYDROCODONE-ACETAMINOPHEN 10-325 MG PO TABS: 1.0000 | ORAL_TABLET | Freq: Two times a day (BID) | ORAL | 0 refills | Status: DC | PRN

## 2016-01-01 MED FILL — HYDROCODON-APAP 10-325: 10-325 | 50 days supply | Qty: 100 | Fill #0

## 2016-01-01 NOTE — Telephone Encounter (Signed)
ok 

## 2016-01-01 NOTE — Telephone Encounter (Signed)
Printed, I gave him the Rx while he was in the lobby. ID checked.

## 2016-01-01 NOTE — Telephone Encounter (Signed)
Rx for hydrocodone was given for 5/325, but this should have been 10/325 pharmacy has asked for Verbal to change this, I have advised 10/325 per Dr Bronson Curb / documented in Rx module quantity same #100

## 2016-01-05 NOTE — Telephone Encounter (Signed)
Sent clearance documents to medical records to be faxed to [facility] [phone]. Thulium laser TURP in Feb 2018.

## 2016-01-06 ENCOUNTER — Other Ambulatory Visit: Payer: Self-pay | Admitting: Physician Assistant

## 2016-01-06 DIAGNOSIS — I480 Paroxysmal atrial fibrillation: Secondary | ICD-10-CM

## 2016-01-07 ENCOUNTER — Other Ambulatory Visit: Payer: Self-pay | Admitting: Internal Medicine

## 2016-01-07 DIAGNOSIS — I480 Paroxysmal atrial fibrillation: Secondary | ICD-10-CM

## 2016-01-07 MED ORDER — METOPROLOL SUCCINATE ER 100 MG PO TB24: 100.0000 mg | ORAL_TABLET | Freq: Two times a day (BID) | ORAL | 3 refills | Status: DC

## 2016-01-07 MED FILL — METOPROLOL SUCC ER 100 MG T: 100 | 90 days supply | Qty: 180 | Fill #0

## 2016-01-14 DIAGNOSIS — M19011 Primary osteoarthritis, right shoulder: Secondary | ICD-10-CM | POA: Diagnosis not present

## 2016-01-14 DIAGNOSIS — M19012 Primary osteoarthritis, left shoulder: Secondary | ICD-10-CM | POA: Diagnosis not present

## 2016-01-29 MED FILL — ELIQUIS 5 MG TABLET: 5 | 30 days supply | Qty: 60 | Fill #0

## 2016-02-20 ENCOUNTER — Other Ambulatory Visit: Payer: Self-pay | Admitting: *Deleted

## 2016-02-20 ENCOUNTER — Other Ambulatory Visit: Payer: Self-pay | Admitting: Rheumatology

## 2016-02-20 MED ORDER — HYDROCODONE-ACETAMINOPHEN 10-325 MG PO TABS: 1.0000 | ORAL_TABLET | Freq: Two times a day (BID) | ORAL | 0 refills | Status: DC | PRN

## 2016-02-20 MED FILL — predniSONE 10 MG TABS: 10 | 30 days supply | Qty: 30 | Fill #2

## 2016-02-20 MED FILL — KLOR-CON M20 TABLET: 20 | 90 days supply | Qty: 90 | Fill #3

## 2016-02-20 MED FILL — NITROGLYCERIN 0.4 MG TAB SL: 0.4 | 20 days supply | Qty: 25 | Fill #1

## 2016-02-20 MED FILL — DOXAZOSIN MESYLATE 4 MG TAB: 4 | 90 days supply | Qty: 90 | Fill #1

## 2016-02-20 MED FILL — HYDROCODON-APAP 10-325: 10-325 | 50 days supply | Qty: 100 | Fill #0

## 2016-02-20 MED FILL — BUTALB-ACETAMIN-CAFF 50-325: 50-325-40 | 90 days supply | Qty: 180 | Fill #1

## 2016-02-20 NOTE — Telephone Encounter (Signed)
Last Visit: 10/22/15 Next Visit: 03/11/16 UDS 10/19/15 narcotic agreement on file/ current  Okay to refill Hydrocodone?

## 2016-02-20 NOTE — Telephone Encounter (Signed)
ok 

## 2016-02-20 NOTE — Telephone Encounter (Signed)
Patient needs a refill of Hydrocodone

## 2016-02-24 ENCOUNTER — Telehealth: Payer: Self-pay | Admitting: Internal Medicine

## 2016-02-24 ENCOUNTER — Encounter: Payer: Medicare Other | Admitting: *Deleted

## 2016-02-24 ENCOUNTER — Telehealth: Payer: Self-pay | Admitting: Cardiology

## 2016-02-24 MED FILL — DICLOFENAC 1.5% TOPICAL SOL: 1.5 | 10 days supply | Qty: 150 | Fill #0

## 2016-02-24 NOTE — Telephone Encounter (Signed)
Spoke with pt and reminded pt of remote transmission that is due today. Pt verbalized understanding.   

## 2016-02-24 NOTE — Telephone Encounter (Signed)
Returned call to patient and he is requesting an appointment with Dr Lovena Le before he goes on a trip.  I have given him 03/12/16 at 12:15.  Jari Sportsman will put on schedule.

## 2016-02-24 NOTE — Telephone Encounter (Signed)
New Message:   I told him you were no longer Dr Lovena Le nurse. He said he wanted to talk to you.

## 2016-02-27 ENCOUNTER — Encounter: Payer: Self-pay | Admitting: Cardiology

## 2016-03-08 ENCOUNTER — Other Ambulatory Visit: Payer: Self-pay | Admitting: Internal Medicine

## 2016-03-08 MED ORDER — AMOXICILLIN-POT CLAVULANATE 875-125 MG PO TABS: 1.0000 | ORAL_TABLET | Freq: Two times a day (BID) | ORAL | 0 refills | Status: DC

## 2016-03-08 MED FILL — ELIQUIS 5 MG TABLET: 5 | 90 days supply | Qty: 180 | Fill #1

## 2016-03-08 MED FILL — AMOX TR-K CLV 875-125 MG TA: 875-125 | 10 days supply | Qty: 20 | Fill #0

## 2016-03-08 NOTE — Telephone Encounter (Signed)
Dr Loletha Carrow as Doc of the day will you please advise on patient's Augmentin refill, leaving for [place] this weekend and feels like he's having a diverticulitis flare with on/off Left sided discomfort.  Routed to Dr Carlean Purl and he is off and hasn't answered back. Thank you Sir.

## 2016-03-08 NOTE — Telephone Encounter (Signed)
Yes, definitely refill for same dosing x 10 days with no refill.  Naturally, he should seek medical care overseas or return home if things worsening.  - HD

## 2016-03-08 NOTE — Telephone Encounter (Signed)
Spoke with Dr Velora Heckler and he said he's having on/off diverticulitis flare with Left sided discomfort.  He is leaving this weekend for [place] and needs to feel better.  Please advise regarding refill Sir, thank you.

## 2016-03-08 NOTE — Telephone Encounter (Signed)
I notified Dr Velora Heckler that his Augmentin has been sent in and advised him to seek medical care abroad if needed. He appreciated [place] taking care of this for him.

## 2016-03-10 DIAGNOSIS — G894 Chronic pain syndrome: Secondary | ICD-10-CM | POA: Insufficient documentation

## 2016-03-10 DIAGNOSIS — M25512 Pain in left shoulder: Secondary | ICD-10-CM

## 2016-03-10 DIAGNOSIS — R5383 Other fatigue: Secondary | ICD-10-CM | POA: Insufficient documentation

## 2016-03-10 DIAGNOSIS — G8929 Other chronic pain: Secondary | ICD-10-CM | POA: Insufficient documentation

## 2016-03-10 DIAGNOSIS — M791 Myalgia, unspecified site: Secondary | ICD-10-CM | POA: Insufficient documentation

## 2016-03-10 DIAGNOSIS — M19042 Primary osteoarthritis, left hand: Secondary | ICD-10-CM | POA: Insufficient documentation

## 2016-03-10 DIAGNOSIS — M7061 Trochanteric bursitis, right hip: Secondary | ICD-10-CM | POA: Insufficient documentation

## 2016-03-10 DIAGNOSIS — I739 Peripheral vascular disease, unspecified: Secondary | ICD-10-CM | POA: Insufficient documentation

## 2016-03-10 DIAGNOSIS — M19041 Primary osteoarthritis, right hand: Secondary | ICD-10-CM | POA: Insufficient documentation

## 2016-03-10 DIAGNOSIS — M47812 Spondylosis without myelopathy or radiculopathy, cervical region: Secondary | ICD-10-CM | POA: Insufficient documentation

## 2016-03-10 DIAGNOSIS — M47816 Spondylosis without myelopathy or radiculopathy, lumbar region: Secondary | ICD-10-CM | POA: Insufficient documentation

## 2016-03-10 NOTE — Progress Notes (Deleted)
Office Visit Note  Patient: Kevin Buff, MD             Date of Birth: Jul 20, 1934           MRN: [id number]             PCP: Limmie Patricia, MD Referring: Altheimer, Legrand Como, MD Visit Date: 03/11/2016 Occupation: _0 @    Subjective:  No chief complaint on file.   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   Patient {ACTIONS;DENIES/REPORTS:21021675::"Denies"} nocturnal pain.  Difficulty dressing/grooming: {ACTIONS;DENIES/REPORTS:21021675::"Denies"} Difficulty climbing stairs: {ACTIONS;DENIES/REPORTS:21021675::"Denies"} Difficulty getting out of chair: {ACTIONS;DENIES/REPORTS:21021675::"Denies"} Difficulty using hands for taps, buttons, cutlery, and/or writing: {ACTIONS;DENIES/REPORTS:21021675::"Denies"}   No Rheumatology ROS completed.   PMFS History:  Patient Active Problem List   Diagnosis Date Noted  . Primary osteoarthritis of both hands 03/10/2016  . DDD cervical spine 03/10/2016  . 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 Chronic left shoulder pain 03/10/2016  . Trochanteric bursitis of right hip 03/10/2016  . Other fatigue 03/10/2016  . Myalgia 03/10/2016  . Other chronic pain 03/10/2016  . Ocular myasthenia gravis ([place]) 10/28/2015  . Typical atrial flutter ([place])   . PVC's (premature ventricular contractions) 01/30/2015  . Pacemaker 04/30/2013  . MGUS (monoclonal gammopathy of unknown significance) 02/17/2013  . Orthostatic hypotension 04/11/2011  . Long term current use of anticoagulant therapy 03/24/2011  . Heart valve replaced by other means 03/24/2011  . S/P AVR 03/24/2011  . Pleural effusion 03/24/2011  . Hypothyroidism 11/22/2010  . Atrial flutter ([place]) 08/19/2010  . CHEST PAIN 04/06/2010  . HYPERLIPIDEMIA-MIXED 12/08/2007  . PRIMARY HYPERCOAGULABLE STATE 12/08/2007  . ANGINA, STABLE/EXERTIONAL 12/08/2007  .  CAD, NATIVE VESSEL 12/08/2007  . AORTIC STENOSIS/ INSUFFICIENCY, NON-RHEUMATIC 12/08/2007  . Atrial fibrillation ([place]) 12/08/2007  . Chronotropic incompetence with sinus node dysfunction (HCC) 12/08/2007    Past Medical History:  Diagnosis Date  . Aortic stenosis    moderate aortic stenosis  . Arthritis   . Benign prostatic hypertrophy   . 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  . Coronary artery disease    status post multiple prior percutaneous coronary interventions, microvascular angina per Dr Olevia Perches  . Heart murmur   . Hypercoagulable state ([place])    chronically anticoagulated with coumadin  . Hyperlipidemia   . Hyperthyroidism   . Hypothyroidism    Dr. Elyse Hsu  . MGUS (monoclonal gammopathy of unknown significance) 02/17/2013  . Paroxysmal atrial fibrillation ([place])    DR. Lia Foyer,   . Stroke [facility])    1990    Family History  Problem Relation Age of Onset  . Heart disease Brother     Twin brother has coronary disease and recent AVR for AS  . Anesthesia problems Neg Hx   . Hypotension Neg Hx   . Malignant hyperthermia Neg Hx   . Pseudochol deficiency Neg Hx    Past Surgical History:  Procedure Laterality Date  . AORTIC VALVE REPLACEMENT  03/15/2011   Procedure: AORTIC VALVE REPLACEMENT (AVR);  Surgeon: Gaye Pollack, MD;  Location: [place];  Service: Open Heart Surgery;  Laterality: N/A;  . APPENDECTOMY    . CARDIAC CATHETERIZATION     11  . CARDIOVERSION    . CARDIOVERSION  04/15/2011   Procedure: CARDIOVERSION;  Surgeon: Loralie Champagne, MD;  Location: [place];  Service: Cardiovascular;  Laterality: N/A;  . CARDIOVERSION N/A 09/11/2014   Procedure: CARDIOVERSION;  Surgeon: Sueanne Margarita, MD;  Location: [facility] ENDOSCOPY;  Service: Cardiovascular;  Laterality: N/A;  . CARDIOVERSION N/A 06/27/2015   Procedure:  CARDIOVERSION;  Surgeon: Thayer Headings, MD;  Location: [facility] ENDOSCOPY;  Service: Cardiovascular;  Laterality: N/A;  . CARDIOVERSION N/A 07/04/2015   Procedure: CARDIOVERSION;  Surgeon: Evans Lance, MD;  Location: [place];  Service: Cardiovascular;  Laterality: N/A;  . EP IMPLANTABLE DEVICE N/A 06/16/2015   Procedure: Pacemaker Implant;  Surgeon: Evans Lance, MD;  Location: [place] CV LAB;  Service: Cardiovascular;  Laterality: N/A;  . hemrrhoidectomy    . LEFT AND RIGHT HEART CATHETERIZATION WITH CORONARY ANGIOGRAM Bilateral 02/01/2011   Procedure: LEFT AND RIGHT HEART CATHETERIZATION WITH CORONARY ANGIOGRAM;  Surgeon: Hillary Bow, MD;  Location: [facility] CATH LAB;  Service: Cardiovascular;  Laterality: Bilateral;  . MAZE  03/15/2011   Procedure: MAZE;  Surgeon: Gaye Pollack, MD;  Location: [place];  Service: Open Heart Surgery;  Laterality: N/A;  . PACEMAKER INSERTION  1991   Guidant PPM, most recent Generator Change by Dr Olevia Perches was 08/22/06  . TEE WITHOUT CARDIOVERSION  04/15/2011   Procedure: TRANSESOPHAGEAL ECHOCARDIOGRAM (TEE);  Surgeon: Loralie Champagne, MD;  Location: [place];  Service: Cardiovascular;  Laterality: N/A;  . TEE WITHOUT CARDIOVERSION N/A 09/11/2014   Procedure: TRANSESOPHAGEAL ECHOCARDIOGRAM (TEE);  Surgeon: Sueanne Margarita, MD;  Location: [facility] ENDOSCOPY;  Service: Cardiovascular;  Laterality: N/A;   Social History   Social History Narrative   Married. Has grown children   Retired Horticulturist, commercial MD           Objective: Vital Signs: There were no vitals taken for this visit.   Physical Exam   Musculoskeletal Exam: ***  CDAI Exam: No CDAI exam completed.    Investigation: Findings:  10/22/2015 CMP normal, TSH normal, CBC hemoglobin 12.8, ESR 16, [place] antibody test negative, CK 76, acetylcholine receptor binding antibody negative, vitamin D 23, 09/25/2015 urine drug screen consistent with medication use    Imaging: No results found.  Speciality  Comments: No specialty comments available.    Procedures:  No procedures performed Allergies: Contrast media [iodinated diagnostic agents]   Assessment / Plan:     Visit Diagnoses: Primary osteoarthritis of both hands  DDD cervical spine  DDD lumbar spine  Chronic left shoulder pain  Trochanteric bursitis of right hip  Other fatigue  Myalgia  Other chronic pain    Orders: No orders of the defined types were placed in this encounter.  No orders of the defined types were placed in this encounter.   Face-to-face time spent with patient was *** minutes. 50% of time was spent in counseling and coordination of care.  Follow-Up Instructions: No Follow-up on file.   Bo Merino, MD  Note - This record has been created using Editor, commissioning.  Chart creation errors have been sought, but may not always  have been located. Such creation errors do not reflect on  the standard of medical care.

## 2016-03-11 ENCOUNTER — Ambulatory Visit: Payer: Self-pay | Admitting: Rheumatology

## 2016-03-12 ENCOUNTER — Ambulatory Visit (INDEPENDENT_AMBULATORY_CARE_PROVIDER_SITE_OTHER): Payer: Medicare Other | Admitting: Internal Medicine

## 2016-03-12 ENCOUNTER — Encounter: Payer: Self-pay | Admitting: Internal Medicine

## 2016-03-12 VITALS — BP 122/82 | HR 80 | Ht 74.0 in | Wt 220.0 lb

## 2016-03-12 DIAGNOSIS — I5032 Chronic diastolic (congestive) heart failure: Secondary | ICD-10-CM

## 2016-03-12 DIAGNOSIS — I480 Paroxysmal atrial fibrillation: Secondary | ICD-10-CM | POA: Diagnosis not present

## 2016-03-12 DIAGNOSIS — Z950 Presence of cardiac pacemaker: Secondary | ICD-10-CM | POA: Diagnosis not present

## 2016-03-12 DIAGNOSIS — I484 Atypical atrial flutter: Secondary | ICD-10-CM

## 2016-03-12 LAB — CUP PACEART INCLINIC DEVICE CHECK
Brady Statistic RV Percent Paced: 3 %
Date Time Interrogation Session: 20180112050000
Implantable Lead Implant Date: 20170417
Implantable Lead Location: 753859
Implantable Lead Model: 7740
Implantable Lead Serial Number: 751382
Lead Channel Impedance Value: 711 Ohm
Lead Channel Impedance Value: 783 Ohm
Lead Channel Sensing Intrinsic Amplitude: 4.1 mV
Lead Channel Setting Pacing Pulse Width: 0.4 ms
MDC IDC LEAD IMPLANT DT: 20170417
MDC IDC LEAD LOCATION: 753860
MDC IDC LEAD SERIAL: 662696
MDC IDC MSMT LEADCHNL RV PACING THRESHOLD AMPLITUDE: 0.5 V
MDC IDC MSMT LEADCHNL RV PACING THRESHOLD PULSEWIDTH: 0.4 ms
MDC IDC MSMT LEADCHNL RV SENSING INTR AMPL: 13.8 mV
MDC IDC PG IMPLANT DT: 20170417
MDC IDC SET LEADCHNL RA PACING AMPLITUDE: 2.5 V
MDC IDC SET LEADCHNL RV PACING AMPLITUDE: 2.5 V
MDC IDC SET LEADCHNL RV SENSING SENSITIVITY: 2.5 mV
MDC IDC STAT BRADY RA PERCENT PACED: 1 % — AB
Pulse Gen Serial Number: 718418

## 2016-03-12 NOTE — Progress Notes (Signed)
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 The patient has an extensive cardiac history with long-standing hypertension and sinus node dysfunction, status post pacemaker insertion, CAD, s/p multiple interventions, PAF, and is s/p AVR for aortic stenosis with surgical MAZE. The patient had maintained sinus rhythm on amiodarone therapy. He developed thyroid dysfunction and possible pulmonary toxicity and his amio has been stopped. He has reverted back to atrial fib/flutter. He is on high dose metoprolol and his ventricular rate has been controlled, mostly. Overall he feels fairly well. He notes that occaisionally his HR increases and he will take an additional metoprolol.  Allergies  Allergen Reactions  . Contrast Media [Iodinated Diagnostic Agents] Hives     Current Outpatient Prescriptions  Medication Sig Dispense Refill  . amoxicillin-clavulanate (AUGMENTIN) 875-125 MG tablet Take 1 tablet by mouth 2 (two) times daily. 20 tablet 0  . apixaban (ELIQUIS) 5 MG TABS tablet Take 1 tablet (5 mg total) by mouth 2 (two) times daily. 180 tablet 3  . bisacodyl (DULCOLAX) 5 MG EC tablet Take 5 mg by mouth daily as needed (constipation).     . butalbital-acetaminophen-caffeine (FIORICET, ESGIC) 50-325-40 MG per tablet Take 1 tablet by mouth 2 (two) times daily as needed (osteo-arthritis pain).     . Coenzyme Q10 (COQ10) 100 MG CAPS Take 100 mg by mouth daily.    . Diclofenac Sodium 1.5 % SOLN Take 1 application by mouth 2 (two) times daily as needed (Knee pain).     Marland Kitchen doxazosin (CARDURA) 4 MG tablet Take 4 mg by mouth at bedtime.     . furosemide (LASIX) 20 MG tablet Take 20 mg by mouth daily as needed for fluid.    Marland Kitchen HYDROcodone-acetaminophen (NORCO) 10-325 MG tablet Take 1 tablet by mouth 2 (two) times daily as needed. 100 tablet 0  . LORazepam (ATIVAN) 1 MG tablet Take 1 mg by mouth at bedtime.     . magnesium oxide (MAG-OX) 400 MG tablet Take 400  mg by mouth daily.    . metoprolol succinate (TOPROL-XL) 100 MG 24 hr tablet Take 1 tablet (100 mg total) by mouth 2 (two) times daily. 180 tablet 3  . Multiple Vitamin (MULTIVITAMIN WITH MINERALS) TABS tablet Take 1 tablet by mouth daily.    . nitroGLYCERIN (NITROSTAT) 0.4 MG SL tablet Place 1 tablet (0.4 mg total) under the tongue every 5 (five) minutes as needed for chest pain. 25 tablet 3  . OVER THE COUNTER MEDICATION Apply 1 patch topically daily as needed (neck and shoulder pain). Chinese pain patch    . potassium chloride SA (KLOR-CON M20) 20 MEQ tablet Take 1 tablet (20 mEq total) by mouth daily. 90 tablet 3  . predniSONE (DELTASONE) 10 MG tablet Take 1 tablet (10 mg total) by mouth daily with breakfast. 30 tablet 5  . predniSONE (DELTASONE) 5 MG tablet as directed. Also has a 10 mg tablet - take as directed    . pyridostigmine (MESTINON) 60 MG tablet Take 1 tablet (60 mg total) by mouth 3 (three) times daily. 90 tablet 5  . rosuvastatin (CRESTOR) 20 MG tablet TAKE 1 TABLET BY MOUTH AT BEDTIME. 90 tablet 3   No current facility-administered medications for this visit.      Past Medical History:  Diagnosis Date  . Aortic stenosis    moderate aortic stenosis  . Arthritis   . Benign prostatic hypertrophy   . Chronotropic incompetence with sinus node dysfunction (  [place])    Status post Guidant dual-mode, dual-pacing, dual-sensing  pacemaker   implantation now programmed to AAI with recent generator change.  . Coronary artery disease    status post multiple prior percutaneous coronary interventions, microvascular angina per Dr Olevia Perches  . Heart murmur   . Hypercoagulable state ([place])    chronically anticoagulated with coumadin  . Hyperlipidemia   . Hyperthyroidism   . Hypothyroidism    Dr. Elyse Hsu  . MGUS (monoclonal gammopathy of unknown significance) 02/17/2013  . Paroxysmal atrial fibrillation ([place])    DR. Lia Foyer,   . Stroke ([place])    1990    ROS:   All systems reviewed  and negative except as noted in the HPI.   Past Surgical History:  Procedure Laterality Date  . AORTIC VALVE REPLACEMENT  03/15/2011   Procedure: AORTIC VALVE REPLACEMENT (AVR);  Surgeon: Gaye Pollack, MD;  Location: [place];  Service: Open Heart Surgery;  Laterality: N/A;  . APPENDECTOMY    . CARDIAC CATHETERIZATION     11  . CARDIOVERSION    . CARDIOVERSION  04/15/2011   Procedure: CARDIOVERSION;  Surgeon: Loralie Champagne, MD;  Location: [place];  Service: Cardiovascular;  Laterality: N/A;  . CARDIOVERSION N/A 09/11/2014   Procedure: CARDIOVERSION;  Surgeon: Sueanne Margarita, MD;  Location: [facility] ENDOSCOPY;  Service: Cardiovascular;  Laterality: N/A;  . CARDIOVERSION N/A 06/27/2015   Procedure: CARDIOVERSION;  Surgeon: Thayer Headings, MD;  Location: [facility] ENDOSCOPY;  Service: Cardiovascular;  Laterality: N/A;  . CARDIOVERSION N/A 07/04/2015   Procedure: CARDIOVERSION;  Surgeon: Evans Lance, MD;  Location: [place];  Service: Cardiovascular;  Laterality: N/A;  . EP IMPLANTABLE DEVICE N/A 06/16/2015   Procedure: Pacemaker Implant;  Surgeon: Evans Lance, MD;  Location: [place] CV LAB;  Service: Cardiovascular;  Laterality: N/A;  . hemrrhoidectomy    . LEFT AND RIGHT HEART CATHETERIZATION WITH CORONARY ANGIOGRAM Bilateral 02/01/2011   Procedure: LEFT AND RIGHT HEART CATHETERIZATION WITH CORONARY ANGIOGRAM;  Surgeon: Hillary Bow, MD;  Location: [facility] CATH LAB;  Service: Cardiovascular;  Laterality: Bilateral;  . MAZE  03/15/2011   Procedure: MAZE;  Surgeon: Gaye Pollack, MD;  Location: [place];  Service: Open Heart Surgery;  Laterality: N/A;  . PACEMAKER INSERTION  1991   Guidant PPM, most recent Generator Change by Dr Olevia Perches was 08/22/06  . TEE WITHOUT CARDIOVERSION  04/15/2011   Procedure: TRANSESOPHAGEAL ECHOCARDIOGRAM (TEE);  Surgeon: Loralie Champagne, MD;  Location: [place];  Service: Cardiovascular;  Laterality: N/A;  . TEE WITHOUT CARDIOVERSION N/A 09/11/2014   Procedure:  TRANSESOPHAGEAL ECHOCARDIOGRAM (TEE);  Surgeon: Sueanne Margarita, MD;  Location: [facility] ENDOSCOPY;  Service: Cardiovascular;  Laterality: N/A;     Family History  Problem Relation Age of Onset  . Heart disease Brother     Twin brother has coronary disease and recent AVR for AS  . Anesthesia problems Neg Hx   . Hypotension Neg Hx   . Malignant hyperthermia Neg Hx   . Pseudochol deficiency Neg Hx      Social History   Social History  . Marital status: Married    Spouse name: N/A  . Number of children: N/A  . Years of education: N/A   Occupational History  . Not on file.   Social History Main Topics  . Smoking status: Never Smoker  . Smokeless tobacco: Never Used  . Alcohol use 0.0 oz/week     Comment: Rarely.  . Drug use: No  . Sexual activity:  Not on file   Other Topics Concern  . Not on file   Social History Narrative   Married. Has grown children   Retired Horticulturist, commercial MD           BP 122/82   Pulse 80   Ht 6\' 2"  (1.88 m)   Wt 220 lb (99.8 kg)   SpO2 98%   BMI 28.25 kg/m   Physical Exam:  stable appearing 82 year old man, NAD HEENT: Unremarkable Neck:  7 cm JVD, no thyromegally Back:  No CVA tenderness Lungs:  Clear with no wheezes, rales, or rhonchi. HEART:  IRegular rhythm, split S2. Abd:  soft, positive bowel sounds, no organomegally, no rebound, no guarding Ext:  2 plus pulses, no edema, no cyanosis, no clubbing Skin:  No rashes no nodules Neuro:  CN II through XII intact, motor grossly intact  PPM interogation - atypical atrial flutter with  Normal RV thresholds.   Assess/Plan:  1. Atrial fib/flutter/Pac's - he remains out of rhythm. I have discussed the treatment options in detail. Because he does not feel bad, he will continue with a strategy of rate control rather than rhythm control. He will call us if his ventricular rates cannot be controlled. We tried to pace terminate his tachycardia but were unsuccessful. 2. chronic diastolic heart failure -  his symptoms are well compensated 3. PPM - he is s/p insertion of a DDD PM. His device is working normally. 4. CAD - he denies anginal symptoms except when he is exerting himself and his rate is high. These have been controlled since he has been back in rhythm. Will follow.  Mikle Bosworth.D.

## 2016-03-12 NOTE — Patient Instructions (Addendum)
Medication Instructions:    Your physician recommends that you continue on your current medications as directed. Please refer to the Current Medication list given to you today.  --- If you need a refill on your cardiac medications before your next appointment, please call your pharmacy. ---  Labwork:  None ordered  Testing/Procedures:  None ordered  Follow-Up: Remote monitoring is used to monitor your Pacemaker of ICD from home. This monitoring reduces the number of office visits required to check your device to one time per year. It allows [place] to keep an eye on the functioning of your device to ensure it is working properly. You are scheduled for a device check from home on 06/14/2016. You may send your transmission at any time that day. If you have a wireless device, the transmission will be sent automatically. After your physician reviews your transmission, you will receive a postcard with your next transmission date.   Your physician wants you to follow-up in: 6 months with Dr. Lovena Le.  You will receive a reminder letter in the mail two months in advance. If you don't receive a letter, please call our office to schedule the follow-up appointment.  Thank you for choosing [facility]!!

## 2016-03-31 ENCOUNTER — Ambulatory Visit (INDEPENDENT_AMBULATORY_CARE_PROVIDER_SITE_OTHER): Payer: Medicare Other | Admitting: Pulmonary Disease

## 2016-03-31 ENCOUNTER — Encounter: Payer: Self-pay | Admitting: Pulmonary Disease

## 2016-03-31 ENCOUNTER — Other Ambulatory Visit: Payer: Self-pay | Admitting: Pulmonary Disease

## 2016-03-31 ENCOUNTER — Ambulatory Visit (INDEPENDENT_AMBULATORY_CARE_PROVIDER_SITE_OTHER)
Admission: RE | Admit: 2016-03-31 | Discharge: 2016-03-31 | Disposition: A | Discharge status: Home / Self Care | Payer: Medicare Other | Source: Ambulatory Visit | Attending: Pulmonary Disease | Admitting: Pulmonary Disease

## 2016-03-31 VITALS — BP 122/62 | HR 76 | Ht 74.0 in | Wt 220.0 lb

## 2016-03-31 DIAGNOSIS — J209 Acute bronchitis, unspecified: Secondary | ICD-10-CM

## 2016-03-31 DIAGNOSIS — J90 Pleural effusion, not elsewhere classified: Secondary | ICD-10-CM

## 2016-03-31 DIAGNOSIS — R05 Cough: Secondary | ICD-10-CM | POA: Diagnosis not present

## 2016-03-31 NOTE — Patient Instructions (Addendum)
You likely have a postviral inflammatory cough. The chest x-ray which I reviewed does not show any acute pneumonia or fluid. I recommend that you try first generation antihistamine such as Benadryl and Flonase nasal spray Treat acid reflux with antiacid medication and elevate the head of the bed Rest your throat to allow full recovery  Please call us back if there is any worsening of symptoms.

## 2016-03-31 NOTE — Progress Notes (Addendum)
Kevin Buff, MD    GA:[id number]    [birth year]  Primary Care Physician:ALTHEIMER,MICHAEL D, MD  Referring Physician: [name], MD [facility] [place], [place] [address]  Chief complaint:  Consult for evaluation of cough.  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 fib, coronary artery disease, aortic valve replacement. 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 He was vacationing in [place] this month and developed cough with green mucus, subjective fevers, dyspnea for the past 3 weeks. He was treated with Augmentin which was stopped secondary to diarrhea. He also tried Cipro but had to be stopped due to diarrhea as well. He is making a slow recovery and feels that the cough is getting better. He denies any fever, sputum production, wheezing, chest pain, palpitations. He does not have any sick contacts.  Outpatient Encounter Prescriptions as of 03/31/2016  Medication Sig  . amoxicillin-clavulanate (AUGMENTIN) 875-125 MG tablet Take 1 tablet by mouth 2 (two) times daily.  Marland Kitchen apixaban (ELIQUIS) 5 MG TABS tablet Take 1 tablet (5 mg total) by mouth 2 (two) times daily.  . bisacodyl (DULCOLAX) 5 MG EC tablet Take 5 mg by mouth daily as needed (constipation).   . butalbital-acetaminophen-caffeine (FIORICET, ESGIC) 50-325-40 MG per tablet Take 1 tablet by mouth 2 (two) times daily as needed (osteo-arthritis pain).   . Coenzyme Q10 (COQ10) 100 MG CAPS Take 100 mg by mouth daily.  . Diclofenac Sodium 1.5 % SOLN Take 1 application by mouth 2 (two) times daily as needed (Knee pain).   Marland Kitchen doxazosin (CARDURA) 4 MG tablet Take 4 mg by mouth at bedtime.   . furosemide (LASIX) 20 MG tablet Take 20 mg by mouth daily as needed for fluid.  Marland Kitchen HYDROcodone-acetaminophen (NORCO) 10-325 MG tablet  Take 1 tablet by mouth 2 (two) times daily as needed.  Marland Kitchen LORazepam (ATIVAN) 1 MG tablet Take 1 mg by mouth at bedtime.   . magnesium oxide (MAG-OX) 400 MG tablet Take 400 mg by mouth daily.  . metoprolol succinate (TOPROL-XL) 100 MG 24 hr tablet Take 1 tablet (100 mg total) by mouth 2 (two) times daily.  . Multiple Vitamin (MULTIVITAMIN WITH MINERALS) TABS tablet Take 1 tablet by mouth daily.  . nitroGLYCERIN (NITROSTAT) 0.4 MG SL tablet Place 1 tablet (0.4 mg total) under the tongue every 5 (five) minutes as needed for chest pain.  Marland Kitchen OVER THE COUNTER MEDICATION Apply 1 patch topically daily as needed (neck and shoulder pain). Chinese pain patch  . potassium chloride SA (KLOR-CON M20) 20 MEQ tablet Take 1 tablet (20 mEq total) by mouth daily.  . predniSONE (DELTASONE) 10 MG tablet Take 1 tablet (10 mg total) by mouth daily with breakfast.  . predniSONE (DELTASONE) 5 MG tablet as directed. Also has a 10 mg tablet - take as directed  . pyridostigmine (MESTINON) 60 MG tablet Take 1 tablet (60 mg total) by mouth 3 (three) times daily.  . rosuvastatin (CRESTOR) 20 MG tablet TAKE 1 TABLET BY MOUTH AT BEDTIME.   No facility-administered encounter medications on file as of 03/31/2016.     Allergies as of 03/31/2016 - Review Complete 03/12/2016  Allergen Reaction Noted  . Contrast media [iodinated diagnostic agents] Hives 02/01/2011    Past Medical History:  Diagnosis Date  . Aortic stenosis    moderate aortic  stenosis  . Arthritis   . Benign prostatic hypertrophy   . 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  . Coronary artery disease    status post multiple prior percutaneous coronary interventions, microvascular angina per Dr Olevia Perches  . Heart murmur   . Hypercoagulable state ([place])    chronically anticoagulated with coumadin  .  Hyperlipidemia   . Hyperthyroidism   . Hypothyroidism    Dr. Elyse Hsu  . MGUS (monoclonal gammopathy of unknown significance) 02/17/2013  . Paroxysmal atrial fibrillation ([place])    DR. Lia Foyer,   . Stroke [facility])    1990    Past Surgical History:  Procedure Laterality Date  . AORTIC VALVE REPLACEMENT  03/15/2011   Procedure: AORTIC VALVE REPLACEMENT (AVR);  Surgeon: Gaye Pollack, MD;  Location: [place];  Service: Open Heart Surgery;  Laterality: N/A;  . APPENDECTOMY    . CARDIAC CATHETERIZATION     11  . CARDIOVERSION    . CARDIOVERSION  04/15/2011   Procedure: CARDIOVERSION;  Surgeon: Loralie Champagne, MD;  Location: [place];  Service: Cardiovascular;  Laterality: N/A;  . CARDIOVERSION N/A 09/11/2014   Procedure: CARDIOVERSION;  Surgeon: Sueanne Margarita, MD;  Location: [facility] ENDOSCOPY;  Service: Cardiovascular;  Laterality: N/A;  . CARDIOVERSION N/A 06/27/2015   Procedure: CARDIOVERSION;  Surgeon: Thayer Headings, MD;  Location: [facility] ENDOSCOPY;  Service: Cardiovascular;  Laterality: N/A;  . CARDIOVERSION N/A 07/04/2015   Procedure: CARDIOVERSION;  Surgeon: Evans Lance, MD;  Location: [place];  Service: Cardiovascular;  Laterality: N/A;  . EP IMPLANTABLE DEVICE N/A 06/16/2015   Procedure: Pacemaker Implant;  Surgeon: Evans Lance, MD;  Location: [place] CV LAB;  Service: Cardiovascular;  Laterality: N/A;  . hemrrhoidectomy    . LEFT AND RIGHT HEART CATHETERIZATION WITH CORONARY ANGIOGRAM Bilateral 02/01/2011   Procedure: LEFT AND RIGHT HEART CATHETERIZATION WITH CORONARY ANGIOGRAM;  Surgeon: Hillary Bow, MD;  Location: [facility] CATH LAB;  Service: Cardiovascular;  Laterality: Bilateral;  . MAZE  03/15/2011   Procedure: MAZE;  Surgeon: Gaye Pollack, MD;  Location: [place];  Service: Open Heart Surgery;  Laterality: N/A;  . PACEMAKER INSERTION  1991   Guidant PPM, most recent Generator Change by Dr Olevia Perches was 08/22/06  . TEE WITHOUT CARDIOVERSION  04/15/2011   Procedure:  TRANSESOPHAGEAL ECHOCARDIOGRAM (TEE);  Surgeon: Loralie Champagne, MD;  Location: [place];  Service: Cardiovascular;  Laterality: N/A;  . TEE WITHOUT CARDIOVERSION N/A 09/11/2014   Procedure: TRANSESOPHAGEAL ECHOCARDIOGRAM (TEE);  Surgeon: Sueanne Margarita, MD;  Location: [facility] ENDOSCOPY;  Service: Cardiovascular;  Laterality: N/A;    Family History  Problem Relation Age of Onset  . Heart disease Brother     Twin brother has coronary disease and recent AVR for AS  . Anesthesia problems Neg Hx   . Hypotension Neg Hx   . Malignant hyperthermia Neg Hx   . Pseudochol deficiency Neg Hx     Social History   Social History  . Marital status: Married    Spouse name: N/A  . Number of children: N/A  . Years of education: N/A   Occupational History  . Not on file.   Social History Main Topics  . Smoking status: Never Smoker  . Smokeless tobacco: Never Used  . Alcohol use 0.0 oz/week     Comment: Rarely.  . Drug use: No  . Sexual  activity: Not on file   Other Topics Concern  . Not on file   Social History Narrative   Married. Has grown children   Retired Horticulturist, commercial MD         Review of systems: Review of Systems  Constitutional: Negative for fever and chills.  HENT: Negative.   Eyes: Negative for blurred vision.  Respiratory: as per HPI  Cardiovascular: Negative for chest pain and palpitations.  Gastrointestinal: Negative for vomiting, diarrhea, blood per rectum. Genitourinary: Negative for dysuria, urgency, frequency and hematuria.  Musculoskeletal: Negative for myalgias, back pain and joint pain.  Skin: Negative for itching and rash.  Neurological: Negative for dizziness, tremors, focal weakness, seizures and loss of consciousness.  Endo/Heme/Allergies: Negative for environmental allergies.  Psychiatric/Behavioral: Negative for depression, suicidal ideas and hallucinations.  All other systems reviewed and are negative.  Physical Exam: Blood pressure 122/62, pulse 76,  height 6\' 2"  (1.88 m), weight 220 lb (99.8 kg), SpO2 96 %. Gen:      No acute distress HEENT:  EOMI, sclera anicteric Neck:     No masses; no thyromegaly Lungs:    Clear to auscultation bilaterally; normal respiratory effort CV:         Irregular; systolic murmur Abd:      + bowel sounds; soft, non-tender; no palpable masses, no distension Ext:    No edema; adequate peripheral perfusion Skin:      Warm and dry; no rash Neuro: alert and oriented x 3 Psych: normal mood and affect  Data Reviewed: CXR 03/31/16- no acute cardiopulmonary abnormality. There is no infiltrate or fluid collection. Images personally reviewed.  PFTs 10/01/13 FVC 4.67 [97%) FEV1 3.37 [99%) F/F 72 TLC 103% DLCO 62% Moderate diffusion defect.  Assessment:  Viral bronchitis Post infectious cough.  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 Chest x-ray which I personally reviewed does not show any acute abnormality or pneumonia. He is making slow recovery but cough still persists. I have advised him to try and rest his throat to allow full recovery. He does have some postnasal drip and acid reflux which is likely worsening the situation. He'll try over-the-counter Benadryl and Flonase for postnasal drip and ranitidine for his acid reflux.  I have asked him to call the office back if symptoms do not improve or if they worsen. Otherwise he will be seen as needed.  Plan/Recommendations: - Benadryl and flonase - Ranitidine  Return as needed  Marshell Garfinkel MD [facility] Pager [phone] 03/31/2016, 1:30 PM  CC: Altheimer, Legrand Como, MD

## 2016-04-01 ENCOUNTER — Telehealth: Payer: Self-pay | Admitting: Pulmonary Disease

## 2016-04-01 MED ORDER — AZITHROMYCIN 250 MG PO TABS: ORAL_TABLET | ORAL | 0 refills | Status: DC

## 2016-04-01 MED FILL — AZITHROMYCIN 250 MG TABLET: 250 | 5 days supply | Qty: 6 | Fill #0

## 2016-04-01 NOTE — Telephone Encounter (Signed)
Pt called saying he is bringing up green sputum. No fevers, chills. Please call in Z pack 500 mg on day 1 and then 250 mg/day from day 2 to 5 to [place].  PM

## 2016-04-01 NOTE — Telephone Encounter (Addendum)
Spoke with pt. And made him aware that I was sending in his prescription. Nothing further is needed at this time. Rx was sent

## 2016-04-02 ENCOUNTER — Other Ambulatory Visit: Payer: Self-pay | Admitting: Oncology

## 2016-04-02 DIAGNOSIS — D472 Monoclonal gammopathy: Secondary | ICD-10-CM

## 2016-04-05 ENCOUNTER — Other Ambulatory Visit (INDEPENDENT_AMBULATORY_CARE_PROVIDER_SITE_OTHER): Payer: Medicare Other

## 2016-04-05 DIAGNOSIS — D472 Monoclonal gammopathy: Secondary | ICD-10-CM | POA: Diagnosis not present

## 2016-04-05 MED FILL — METOPROLOL SUCC ER 100 MG T: 100 | 90 days supply | Qty: 180 | Fill #1

## 2016-04-05 MED FILL — LORazepam 1 MG TABS: 1 | 90 days supply | Qty: 180 | Fill #1

## 2016-04-06 LAB — COMPREHENSIVE METABOLIC PANEL
ALBUMIN: 3.9 g/dL (ref 3.5–4.8)
ALK PHOS: 83 IU/L (ref 39–117)
ALT: 23 IU/L (ref 0–44)
AST: 18 IU/L (ref 0–40)
Albumin/Globulin Ratio: 1.3 (ref 1.2–2.2)
BUN/Creatinine Ratio: 16 (ref 10–24)
BUN: 14 mg/dL (ref 8–27)
Bilirubin Total: 0.3 mg/dL (ref 0.0–1.2)
CALCIUM: 9.3 mg/dL (ref 8.6–10.2)
CO2: 25 mmol/L (ref 18–29)
CREATININE: 0.85 mg/dL (ref 0.76–1.27)
Chloride: 99 mmol/L (ref 96–106)
GFR calc Af Amer: 96 mL/min/{1.73_m2} (ref >59)
GFR, EST NON AFRICAN AMERICAN: 83 mL/min/{1.73_m2} (ref >59)
GLOBULIN, TOTAL: 3.1 g/dL (ref 1.5–4.5)
GLUCOSE: 92 mg/dL (ref 65–99)
Potassium: 4.7 mmol/L (ref 3.5–5.2)
SODIUM: 140 mmol/L (ref 134–144)
Total Protein: 7 g/dL (ref 6.0–8.5)

## 2016-04-06 LAB — CBC WITH DIFFERENTIAL/PLATELET
BASOS ABS: 0 10*3/uL (ref 0.0–0.2)
Basos: 0 %
EOS (ABSOLUTE): 0.1 10*3/uL (ref 0.0–0.4)
EOS: 2 %
HEMATOCRIT: 38.4 % (ref 37.5–51.0)
HEMOGLOBIN: 12.4 g/dL — AB (ref 13.0–17.7)
IMMATURE GRANULOCYTES: 0 %
Immature Grans (Abs): 0 10*3/uL (ref 0.0–0.1)
LYMPHS: 20 %
Lymphocytes Absolute: 1.4 10*3/uL (ref 0.7–3.1)
MCH: 28 pg (ref 26.6–33.0)
MCHC: 32.3 g/dL (ref 31.5–35.7)
MCV: 87 fL (ref 79–97)
MONOCYTES: 8 %
Monocytes Absolute: 0.5 10*3/uL (ref 0.1–0.9)
NEUTROS PCT: 70 %
Neutrophils Absolute: 4.8 10*3/uL (ref 1.4–7.0)
Platelets: 289 10*3/uL (ref 150–379)
RBC: 4.43 x10E6/uL (ref 4.14–5.80)
RDW: 13.7 % (ref 12.3–15.4)
WBC: 6.9 10*3/uL (ref 3.4–10.8)

## 2016-04-06 LAB — IGG, IGA, IGM
IGA/IMMUNOGLOBULIN A, SERUM: 207 mg/dL (ref 61–437)
IGG (IMMUNOGLOBIN G), SERUM: 953 mg/dL (ref 700–1600)
IgM (Immunoglobulin M), Srm: 732 mg/dL — ABNORMAL HIGH (ref 15–143)

## 2016-04-06 LAB — KAPPA/LAMBDA LIGHT CHAINS
IG KAPPA FREE LIGHT CHAIN: 25.8 mg/L — AB (ref 3.3–19.4)
IG LAMBDA FREE LIGHT CHAIN: 32.7 mg/L — AB (ref 5.7–26.3)
KAPPA/LAMBDA FLC RATIO: 0.79 (ref 0.26–1.65)

## 2016-04-07 ENCOUNTER — Telehealth: Payer: Self-pay | Admitting: *Deleted

## 2016-04-07 NOTE — Telephone Encounter (Signed)
Pt called - no answer; left message "lab all stable compared with previous. IgM almost identical to 3 mos ago at 732 mg; was 731; Hb 12.4 " per Dr Beryle Beams. And call for any questions.

## 2016-04-07 NOTE — Telephone Encounter (Signed)
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 IgM almost identical to 3 mos ago at 732 mg; was 731; Hb 12.4

## 2016-04-12 ENCOUNTER — Ambulatory Visit (INDEPENDENT_AMBULATORY_CARE_PROVIDER_SITE_OTHER): Payer: Medicare Other | Admitting: Oncology

## 2016-04-12 ENCOUNTER — Encounter: Payer: Self-pay | Admitting: Oncology

## 2016-04-12 VITALS — BP 109/70 | HR 84 | Temp 97.8°F | Ht 74.0 in | Wt 220.0 lb

## 2016-04-12 DIAGNOSIS — Z7901 Long term (current) use of anticoagulants: Secondary | ICD-10-CM | POA: Diagnosis not present

## 2016-04-12 DIAGNOSIS — G7000 Myasthenia gravis without (acute) exacerbation: Secondary | ICD-10-CM | POA: Diagnosis not present

## 2016-04-12 DIAGNOSIS — Z950 Presence of cardiac pacemaker: Secondary | ICD-10-CM

## 2016-04-12 DIAGNOSIS — Z7952 Long term (current) use of systemic steroids: Secondary | ICD-10-CM | POA: Diagnosis not present

## 2016-04-12 DIAGNOSIS — D472 Monoclonal gammopathy: Secondary | ICD-10-CM | POA: Diagnosis present

## 2016-04-12 DIAGNOSIS — Z952 Presence of prosthetic heart valve: Secondary | ICD-10-CM

## 2016-04-12 DIAGNOSIS — Z91041 Radiographic dye allergy status: Secondary | ICD-10-CM | POA: Diagnosis not present

## 2016-04-12 DIAGNOSIS — D649 Anemia, unspecified: Secondary | ICD-10-CM

## 2016-04-12 DIAGNOSIS — I481 Persistent atrial fibrillation: Secondary | ICD-10-CM | POA: Diagnosis not present

## 2016-04-12 NOTE — Patient Instructions (Addendum)
Lab here 6/11 and October 15 Md vist 1-2 weeks after lab in June

## 2016-04-12 NOTE — Progress Notes (Signed)
Hematology and Oncology Follow Up Visit  Kevin WESTRUP, MD [id number] 1935-02-14 82 y.o. 04/12/2016 5:03 PM   Principle Diagnosis: Encounter Diagnoses  Name Primary?  Marland Kitchen MGUS (monoclonal gammopathy of unknown significance) Yes  . 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 IgM level initially done on 01/31/2013 was 494 mg percent (41-251) with normal IgG and IgA. No monoclonal spike in the gamma region. Repeat profile done on 04/05/2013 with IgM 517 mg percent. Immunofixation electrophoresis showed monoclonal lambda restriction. Hemoglobin 13.8. Normal white count and platelet count. Serum albumin 4.3 with total protein 7.6, BUN 18, creatinine 1.1, calcium 9.7. A chest radiograph showed changes from previous pacemaker implant. Other postoperative changes from previous median sternotomy and aortic valvuloplasty. I did not get a bone survey. He had no neurologic signs or symptoms. We discussed a differential diagnosis including benign monoclonal gammopathy, versus early Waldenstrom's macroglobulinemia (well-differentiated B-cell lymphocytic lymphoma), versus IgM multiple myeloma. We discussed whether or not a bone marrow would add any insight into the process. Although it might confirm a diagnosis of Waldenstrm's, with a near normal hemoglobin, initial treatment would be observation alone and we mutually decided to defer a bone marrow procedure. Serial IgM levels over the last 3 years have shown minimal increases and his hemoglobin remained stable at 12 g.  Interim History:   He has ongoing cardiac issues. He has persistent atrial fibrillation. He has had multiple cardioversions in the past. He has a pacemaker. He has had a bioprosthetic aortic valve replacement. He is on chronic anticoagulation with full dose Eloquis 5 mg twice daily  (he has surprisingly normal renal function). He gets dyspnea on exertion but not at rest. No current chest pain. He has ocular myasthenia. He decided to give himself a trial off Mestinon about 6 weeks ago and finding no difference in his symptoms, will likely not resume it. He was [place] in [place] month. He contracted flulike symptoms with a hacking nonproductive cough. Chills. No high fever. Cough has persisted for about 5 weeks and now resolving. He got a chest radiograph when he returned home on January 31 which did not show any pneumonia. He has had ongoing problems with polyarthralgia particularly in his hand joints. He is under the care of a rheumatologist Dr. Patrecia Pour. He is taking chronic low-dose prednisone 5-10 mg daily. He denies any paresthesias. No change in vision. No headache.   Medications: reviewed  Allergies:  Allergies  Allergen Reactions  . Contrast Media [Iodinated Diagnostic Agents] Hives    Review of Systems: See interim history Remaining ROS negative:   Physical Exam: Blood pressure 109/70, pulse 84, temperature 97.8 F (36.6 C), temperature source Oral, height _0  (1.88 m), weight 220 lb (99.8 kg), SpO2 97 %. Wt Readings from Last 3 Encounters:  04/12/16 220 lb (99.8 kg)  03/31/16 220 lb (99.8 kg)  03/12/16 220 lb (99.8 kg)     General appearance: Well-nourished Caucasian man HENNT: Pharynx no erythema, exudate, mass, or ulcer. No thyromegaly or thyroid nodules Lymph nodes: No cervical, supraclavicular, or axillary lymphadenopathy Breasts:  Lungs: Clear to auscultation, resonant to percussion throughout Heart: Irregularly irregular rhythm, no murmur, no gallop, no rub, no click, no edema. Known aortic valve replacement. Abdomen: Soft, nontender, normal bowel sounds, no mass, no organomegaly Extremities: No edema, no calf tenderness Musculoskeletal: no joint deformities GU:  Vascular: Carotid pulses 2+, no bruits,  distal pulses: Neurologic:  Alert, oriented, PERRLA, I was unable to get a good look at his fundi. cranial nerves grossly normal, motor strength 5 over 5, reflexes 1+ symmetric at the biceps, absent symmetric at the knees, upper body coordination normal, gait normal, sensation intact to vibration over the fingertips by tuning fork exam. Skin: No rash or ecchymosis  Lab Results: CBC W/Diff    Component Value Date/Time   WBC 6.9 04/05/2016 1408   WBC 7.5 11/20/2015 0015   RBC 4.43 04/05/2016 1408   RBC 4.33 11/20/2015 0015   HGB 12.1 (L) 11/20/2015 0015   HGB 13.8 04/05/2013 1158   HCT 38.4 04/05/2016 1408   HCT 41.5 04/05/2013 1158   PLT 289 04/05/2016 1408   MCV 87 04/05/2016 1408   MCV 87.9 04/05/2013 1158   MCH 28.0 04/05/2016 1408   MCH 27.9 11/20/2015 0015   MCHC 32.3 04/05/2016 1408   MCHC 31.5 11/20/2015 0015   RDW 13.7 04/05/2016 1408   RDW 15.0 (H) 04/05/2013 1158   LYMPHSABS 1.4 04/05/2016 1408   LYMPHSABS 0.7 (L) 04/05/2013 1158   MONOABS 0.5 05/06/2015 2110   MONOABS 0.2 04/05/2013 1158   EOSABS 0.1 04/05/2016 1408   BASOSABS 0.0 04/05/2016 1408   BASOSABS 0.0 04/05/2013 1158     Chemistry      Component Value Date/Time   NA 140 04/05/2016 1408   NA 141 04/05/2013 1159   K 4.7 04/05/2016 1408   K 3.8 04/05/2013 1159   CL 99 04/05/2016 1408   CO2 25 04/05/2016 1408   CO2 30 (H) 04/05/2013 1159   BUN 14 04/05/2016 1408   BUN 18.3 04/05/2013 1159   CREATININE 0.85 04/05/2016 1408   CREATININE 0.88 11/26/2013 1142   CREATININE 1.1 04/05/2013 1159      Component Value Date/Time   CALCIUM 9.3 04/05/2016 1408   CALCIUM 9.7 04/05/2013 1159   ALKPHOS 83 04/05/2016 1408   ALKPHOS 52 04/05/2013 1159   AST 18 04/05/2016 1408   AST 19 04/05/2013 1159   ALT 23 04/05/2016 1408   ALT 16 04/05/2013 1159   BILITOT 0.3 04/05/2016 1408   BILITOT 0.45 04/05/2013 1159    IgM: 732 mg percent on 04/05/2016 compare with 731 on 12/24/2015, 638 on 04/11/2015, 551 on 11/26/2013. No concomitant  suppression of the other immunoglobulins  Radiological Studies: Dg Chest 2 View  Result Date: 03/31/2016 CLINICAL DATA:  Cough, chest congestion for the past 3-4 weeks. History of coronary artery disease, aortic valve replacement, permanent pacemaker placement. EXAM: CHEST  2 VIEW COMPARISON:  Chest x-ray of November 20, 2015 FINDINGS: The lungs are well-expanded. There is no focal infiltrate. There is no pleural effusion. The heart and pulmonary vascularity are normal. The left atrial appendage clip is in stable position. The prosthetic aortic valve ring is visible. The sternal wires are intact. The two ICD devices appear to be in stable position. The bony thorax exhibits no acute abnormality. IMPRESSION: There is no pneumonia nor other acute cardiopulmonary abnormality. Electronically Signed   By: David  Martinique M.D.   On: 03/31/2016 13:33    Impression:  #1. 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 Minimal rise in IgM over time with stable hemoglobin, normal platelet count, and no neurologic signs or symptoms. Plan: Continue observation alone. If treatment eventually needed, I would likely start with single agent Rituxan given his age and cardiac condition.  CC: Patient Care Team: [name], MD as PCP - General (Endocrinology) Evans Lance, MD (Cardiology) Hillary Bow, MD (Cardiology) Bo Merino, MD as Consulting Physician (Rheumatology)   Annia Belt, MD 2/12/20185:03 PM

## 2016-04-29 ENCOUNTER — Other Ambulatory Visit: Payer: Self-pay | Admitting: Rheumatology

## 2016-04-29 ENCOUNTER — Telehealth: Payer: Self-pay | Admitting: Rheumatology

## 2016-04-29 ENCOUNTER — Telehealth: Payer: Self-pay | Admitting: Internal Medicine

## 2016-04-29 MED ORDER — HYDROCODONE-ACETAMINOPHEN 10-325 MG PO TABS
1.0000 | ORAL_TABLET | Freq: Two times a day (BID) | ORAL | 0 refills | Status: DC | PRN
Start: 2016-04-29 — End: 2016-06-18

## 2016-04-29 NOTE — Telephone Encounter (Signed)
Received incoming call from pt. Pt stated he would like to schedule appt tomorrow r/t a-fib/flutter. Pt would like to speak with Dr. Lovena Le about Tikosyn. Made appt for 04/30/16 at 10:30 AM, arriving at 10:15 AM. Pt verbalized understanding and thanked me.

## 2016-04-29 NOTE — Telephone Encounter (Signed)
Patient called requesting a refill of his hydrocodone.  [phone].  Thank you.

## 2016-04-29 NOTE — Telephone Encounter (Signed)
-----   Message from Carole Binning, LPN sent at [id number]  3:11 PM EST ----- Regarding: Please schedule patient for follow up appointment Please schedule patient for follow up appointment. Patient was due 04/2016. Thanks!

## 2016-04-29 NOTE — Telephone Encounter (Signed)
ok 

## 2016-04-29 NOTE — Telephone Encounter (Signed)
Attempted to call patient about his follow up.  Will try again in the morning.

## 2016-04-29 NOTE — Telephone Encounter (Signed)
Last Visit: 10/22/15 Next Visit: was due in Feb 2018 Message sent to front to schedule patient.  UDS 10/19/15 narcotic agreement 08/2015  Okay to refill Hydrocodone?

## 2016-04-30 ENCOUNTER — Encounter: Payer: Self-pay | Admitting: Internal Medicine

## 2016-04-30 ENCOUNTER — Ambulatory Visit (INDEPENDENT_AMBULATORY_CARE_PROVIDER_SITE_OTHER): Payer: Medicare Other | Admitting: Internal Medicine

## 2016-04-30 ENCOUNTER — Other Ambulatory Visit: Payer: Self-pay | Admitting: *Deleted

## 2016-04-30 ENCOUNTER — Telehealth: Payer: Self-pay | Admitting: Rheumatology

## 2016-04-30 VITALS — BP 102/64 | HR 59 | Ht 73.5 in | Wt 221.0 lb

## 2016-04-30 DIAGNOSIS — I480 Paroxysmal atrial fibrillation: Secondary | ICD-10-CM

## 2016-04-30 DIAGNOSIS — Z95810 Presence of automatic (implantable) cardiac defibrillator: Secondary | ICD-10-CM | POA: Diagnosis not present

## 2016-04-30 DIAGNOSIS — I495 Sick sinus syndrome: Secondary | ICD-10-CM

## 2016-04-30 DIAGNOSIS — Z5181 Encounter for therapeutic drug level monitoring: Secondary | ICD-10-CM

## 2016-04-30 LAB — CUP PACEART INCLINIC DEVICE CHECK
Implantable Lead Implant Date: 20170417
Implantable Lead Location: 753859
Implantable Lead Model: 7741
Implantable Lead Serial Number: 662696
Implantable Pulse Generator Implant Date: 20170417
MDC IDC LEAD IMPLANT DT: 20170417
MDC IDC LEAD LOCATION: 753860
MDC IDC LEAD SERIAL: 751382
MDC IDC SESS DTM: 20180302133927
Pulse Gen Serial Number: 718418

## 2016-04-30 NOTE — Progress Notes (Addendum)
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 The patient has an extensive cardiac history with long-standing hypertension and sinus node dysfunction, status post pacemaker insertion, CAD, s/p multiple interventions, PAF, and is s/p AVR for aortic stenosis with surgical MAZE. The patient had maintained sinus rhythm on amiodarone therapy. He developed thyroid dysfunction and possible pulmonary toxicity and his amio has been stopped. He has reverted back to atrial fib/flutter. He is on high dose metoprolol and his ventricular rate has been controlled, mostly. Since I saw him last, he has had influenza. He did not get a flu vaccine and did not take Tamiflu. He has not had much heart racing. He wonders about dofetilide.  Allergies  Allergen Reactions  . Contrast Media [Iodinated Diagnostic Agents] Hives     Current Outpatient Prescriptions  Medication Sig Dispense Refill  . apixaban (ELIQUIS) 5 MG TABS tablet Take 1 tablet (5 mg total) by mouth 2 (two) times daily. 180 tablet 3  . aspirin EC 81 MG tablet Take 81 mg by mouth daily.    . bisacodyl (DULCOLAX) 5 MG EC tablet Take 5 mg by mouth daily as needed (constipation).     . butalbital-acetaminophen-caffeine (FIORICET, ESGIC) 50-325-40 MG per tablet Take 1 tablet by mouth 2 (two) times daily as needed (osteo-arthritis pain).     . Coenzyme Q10 (COQ10) 100 MG CAPS Take 100 mg by mouth daily.    . Diclofenac Sodium 1.5 % SOLN Take 1 application by mouth 2 (two) times daily as needed (Knee pain).     Marland Kitchen doxazosin (CARDURA) 4 MG tablet Take 4 mg by mouth at bedtime.     . furosemide (LASIX) 20 MG tablet Take 20 mg by mouth daily as needed for fluid.    Marland Kitchen HYDROcodone-acetaminophen (NORCO) 10-325 MG tablet Take 1 tablet by mouth 2 (two) times daily as needed. 100 tablet 0  . LORazepam (ATIVAN) 1 MG tablet Take 1 mg by mouth at bedtime.     . magnesium oxide (MAG-OX) 400 MG tablet Take 400 mg by mouth  daily.    . metoprolol succinate (TOPROL-XL) 100 MG 24 hr tablet Take 1 tablet (100 mg total) by mouth 2 (two) times daily. 180 tablet 3  . Multiple Vitamin (MULTIVITAMIN WITH MINERALS) TABS tablet Take 1 tablet by mouth daily.    . nitroGLYCERIN (NITROSTAT) 0.4 MG SL tablet Place 1 tablet (0.4 mg total) under the tongue every 5 (five) minutes as needed for chest pain. 25 tablet 3  . OVER THE COUNTER MEDICATION Apply 1 patch topically daily as needed (neck and shoulder pain). Chinese pain patch    . potassium chloride SA (KLOR-CON M20) 20 MEQ tablet Take 1 tablet (20 mEq total) by mouth daily. 90 tablet 3  . predniSONE (DELTASONE) 5 MG tablet as directed. Also has a 10 mg tablet - take as directed    . pyridostigmine (MESTINON) 60 MG tablet Take 1 tablet (60 mg total) by mouth 3 (three) times daily. 90 tablet 5   No current facility-administered medications for this visit.      Past Medical History:  Diagnosis Date  . Aortic stenosis    moderate aortic stenosis  . Arthritis   . Benign prostatic hypertrophy   . 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  . Coronary artery disease  status post multiple prior percutaneous coronary interventions, microvascular angina per Dr Olevia Perches  . Heart murmur   . Hypercoagulable state ([place])    chronically anticoagulated with coumadin  . Hyperlipidemia   . Hyperthyroidism   . Hypothyroidism    Dr. Elyse Hsu  . MGUS (monoclonal gammopathy of unknown significance) 02/17/2013  . Paroxysmal atrial fibrillation ([place])    DR. Lia Foyer,   . Stroke ([place])    1990    ROS:   All systems reviewed and negative except as noted in the HPI.   Past Surgical History:  Procedure Laterality Date  . AORTIC VALVE REPLACEMENT  03/15/2011   Procedure: AORTIC VALVE REPLACEMENT (AVR);  Surgeon: Gaye Pollack, MD;   Location: [place];  Service: Open Heart Surgery;  Laterality: N/A;  . APPENDECTOMY    . CARDIAC CATHETERIZATION     11  . CARDIOVERSION    . CARDIOVERSION  04/15/2011   Procedure: CARDIOVERSION;  Surgeon: Loralie Champagne, MD;  Location: [place];  Service: Cardiovascular;  Laterality: N/A;  . CARDIOVERSION N/A 09/11/2014   Procedure: CARDIOVERSION;  Surgeon: Sueanne Margarita, MD;  Location: [facility] ENDOSCOPY;  Service: Cardiovascular;  Laterality: N/A;  . CARDIOVERSION N/A 06/27/2015   Procedure: CARDIOVERSION;  Surgeon: Thayer Headings, MD;  Location[facility] ENDOSCOPY;  Service: Cardiovascular;  Laterality: N/A;  . CARDIOVERSION N/A 07/04/2015   Procedure: CARDIOVERSION;  Surgeon: Evans Lance, MD;  Location: [place];  Service: Cardiovascular;  Laterality: N/A;  . EP IMPLANTABLE DEVICE N/A 06/16/2015   Procedure: Pacemaker Implant;  Surgeon: Evans Lance, MD;  Location: [place] CV LAB;  Service: Cardiovascular;  Laterality: N/A;  . hemrrhoidectomy    . LEFT AND RIGHT HEART CATHETERIZATION WITH CORONARY ANGIOGRAM Bilateral 02/01/2011   Procedure: LEFT AND RIGHT HEART CATHETERIZATION WITH CORONARY ANGIOGRAM;  Surgeon: Hillary Bow, MD;  Location[facility] CATH LAB;  Service: Cardiovascular;  Laterality: Bilateral;  . MAZE  03/15/2011   Procedure: MAZE;  Surgeon: Gaye Pollack, MD;  Location: [place];  Service: Open Heart Surgery;  Laterality: N/A;  . PACEMAKER INSERTION  1991   Guidant PPM, most recent Generator Change by Dr Olevia Perches was 08/22/06  . TEE WITHOUT CARDIOVERSION  04/15/2011   Procedure: TRANSESOPHAGEAL ECHOCARDIOGRAM (TEE);  Surgeon: Loralie Champagne, MD;  Location: [place];  Service: Cardiovascular;  Laterality: N/A;  . TEE WITHOUT CARDIOVERSION N/A 09/11/2014   Procedure: TRANSESOPHAGEAL ECHOCARDIOGRAM (TEE);  Surgeon: Sueanne Margarita, MD;  Location: [facility] ENDOSCOPY;  Service: Cardiovascular;  Laterality: N/A;     Family History  Problem Relation Age of Onset  . Heart disease Brother       Twin brother has coronary disease and recent AVR for AS  . Anesthesia problems Neg Hx   . Hypotension Neg Hx   . Malignant hyperthermia Neg Hx   . Pseudochol deficiency Neg Hx      Social History   Social History  . Marital status: Married    Spouse name: N/A  . Number of children: N/A  . Years of education: N/A   Occupational History  . Not on file.   Social History Main Topics  . Smoking status: Never Smoker  . Smokeless tobacco: Never Used  . Alcohol use No  . Drug use: No  . Sexual activity: Not on file   Other Topics Concern  . Not on file   Social History Narrative   Married. Has grown children   Retired Horticulturist, commercial MD  BP 102/64   Pulse (!) 59   Ht 6' 1.5" (1.867 m)   Wt 221 lb (100.2 kg)   BMI 28.76 kg/m   Physical Exam:  stable appearing 82 year old man, NAD HEENT: Unremarkable Neck:  7 cm JVD, no thyromegally Back:  No CVA tenderness Lungs:  Clear with no wheezes, rales, or rhonchi. HEART:  IRegular rhythm, split S2. Abd:  soft, positive bowel sounds, no organomegally, no rebound, no guarding Ext:  2 plus pulses, no edema, no cyanosis, no clubbing Skin:  No rashes no nodules Neuro:  CN II through XII intact, motor grossly intact  PPM interogation - atypical atrial flutter with  Normal RV thresholds.   Assess/Plan:  1. Atrial fib/flutter/Pac's - he remains out of rhythm. I have discussed the treatment options in detail. Because he does not feel bad, he will continue with a strategy of rate control rather than rhythm control. He will call us if his ventricular rates cannot be controlled or if he wants to try dofetilide with a 4 day hospital stay.  2. chronic diastolic heart failure - his symptoms are well compensated 3. PPM - he is s/p insertion of a DDD PM. His device is working normally. 4. CAD - he denies anginal symptoms except when he is exerting himself and his rate is high. These have been controlled since his rate in atrial  flutter is controlled. Will follow.  Mikle Bosworth.D.

## 2016-04-30 NOTE — Patient Instructions (Addendum)
Medication Instructions:  Your physician recommends that you continue on your current medications as directed. Please refer to the Current Medication list given to you today.   Labwork: None Ordered   Testing/Procedures: None Ordered    Follow-Up: Your physician recommends that you schedule a follow-up appointment in: 3 months with Dr. Taylor   Any Other Special Instructions Will Be Listed Below (If Applicable).     If you need a refill on your cardiac medications before your next appointment, please call your pharmacy.   

## 2016-04-30 NOTE — Telephone Encounter (Signed)
-----   Message from Carole Binning, LPN sent at [id number]  3:11 PM EST ----- Regarding: Please schedule patient for follow up appointment Please schedule patient for follow up appointment. Patient was due 04/2016. Thanks!

## 2016-04-30 NOTE — Telephone Encounter (Signed)
LVMOM for patient to call the office to schedule his follow up appointment.

## 2016-05-03 MED FILL — HYDROCODON-APAP 10-325: 10-325 | 50 days supply | Qty: 100 | Fill #0

## 2016-05-04 LAB — PAIN MGMT, PROFILE 5 W/CONF, U
AMINOCLONAZEPAM: NEGATIVE ng/mL (ref <25)
AMOBARBITAL: NEGATIVE ng/mL (ref <100)
AMPHETAMINES: NEGATIVE ng/mL (ref <500)
Alphahydroxyalprazolam: NEGATIVE ng/mL (ref <25)
Alphahydroxymidazolam: NEGATIVE ng/mL (ref <50)
Alphahydroxytriazolam: NEGATIVE ng/mL (ref <50)
BARBITURATES: POSITIVE ng/mL — AB (ref <300)
BENZODIAZEPINES: POSITIVE ng/mL — AB (ref <100)
BUTALBITAL: 3105 ng/mL — AB (ref <100)
COCAINE METABOLITE: NEGATIVE ng/mL (ref <150)
CREATININE: 97.8 mg/dL (ref >=20.0)
Codeine: NEGATIVE ng/mL (ref <50)
HYDROMORPHONE: 308 ng/mL — AB (ref <50)
Hydrocodone: 301 ng/mL — ABNORMAL HIGH (ref <50)
Hydroxyethylflurazepam: NEGATIVE ng/mL (ref <50)
LORAZEPAM: 597 ng/mL — AB (ref <50)
METHADONE METABOLITE: NEGATIVE ng/mL (ref <100)
Marijuana Metabolite: NEGATIVE ng/mL (ref <20)
Morphine: NEGATIVE ng/mL (ref <50)
NORDIAZEPAM: NEGATIVE ng/mL (ref <50)
Norhydrocodone: 1275 ng/mL — ABNORMAL HIGH (ref <50)
OXIDANT: NEGATIVE ug/mL (ref <200)
Opiates: POSITIVE ng/mL — AB (ref <100)
Oxazepam: NEGATIVE ng/mL (ref <50)
Oxycodone: NEGATIVE ng/mL (ref <100)
PENTOBARBITAL: NEGATIVE ng/mL (ref <100)
PH: 6.71 (ref 4.5–9.0)
Phenobarbital: NEGATIVE ng/mL (ref <100)
Secobarbital: NEGATIVE ng/mL (ref <100)
TEMAZEPAM: NEGATIVE ng/mL (ref <50)

## 2016-05-04 NOTE — Progress Notes (Signed)
Labs consistent

## 2016-05-12 ENCOUNTER — Other Ambulatory Visit: Payer: Self-pay | Admitting: Pulmonary Disease

## 2016-05-12 MED FILL — BUTALB-ACETAMIN-CAFF 50-325: 50-325-40 | 90 days supply | Qty: 180 | Fill #2

## 2016-05-12 MED FILL — DOXAZOSIN MESYLATE 4 MG TAB: 4 | 90 days supply | Qty: 90 | Fill #2

## 2016-05-12 MED FILL — predniSONE 10 MG TABS: 10 | 30 days supply | Qty: 30 | Fill #3

## 2016-05-12 MED FILL — POTASSIUM CL ER 20 MEQ TAB: 20 | 90 days supply | Qty: 90 | Fill #0

## 2016-05-13 DIAGNOSIS — L570 Actinic keratosis: Secondary | ICD-10-CM | POA: Diagnosis not present

## 2016-05-13 DIAGNOSIS — C4442 Squamous cell carcinoma of skin of scalp and neck: Secondary | ICD-10-CM | POA: Diagnosis not present

## 2016-05-13 DIAGNOSIS — D485 Neoplasm of uncertain behavior of skin: Secondary | ICD-10-CM | POA: Diagnosis not present

## 2016-05-20 DIAGNOSIS — Z8673 Personal history of transient ischemic attack (TIA), and cerebral infarction without residual deficits: Secondary | ICD-10-CM | POA: Insufficient documentation

## 2016-05-20 DIAGNOSIS — Z950 Presence of cardiac pacemaker: Secondary | ICD-10-CM | POA: Insufficient documentation

## 2016-05-20 DIAGNOSIS — E559 Vitamin D deficiency, unspecified: Secondary | ICD-10-CM | POA: Insufficient documentation

## 2016-05-29 NOTE — Progress Notes (Signed)
Office Visit Note  Patient: Kevin Buff, MD             Date of Birth: [birth year]           MRN: [id number]             PCP: Limmie Patricia, MD Referring: Altheimer, Legrand Como, MD Visit Date: 06/01/2016 Occupation: @GUAROCC @    Subjective:  Pain hands.   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 He continues to have more of pain and discomfort in his bilateral hands, bilateral shoulders, hip joints, knee joints and C-spine. He's been getting massage therapy which only helps to some extent. He is also tried physical therapy. Topical Voltaren gel was not very effective. He has not seen a great response from prednisone. He is currently on prednisone 5 mg by mouth daily. He also tried stopping Crestor for 4 months which did not make a difference in his myalgias. He states Vicodin 5-10 mg twice a day when necessary helps him to function on daily basis.  Activities of Daily Living:  Patient reports morning stiffness for all day hours.   Patient Reports nocturnal pain.  Difficulty dressing/grooming: Reports Difficulty climbing stairs: Reports Difficulty getting out of chair: Reports Difficulty using hands for taps, buttons, cutlery, and/or writing: Reports   Review of Systems  Constitutional: Positive for fatigue. Negative for night sweats and weakness ( ).  HENT: Negative for mouth sores, mouth dryness and nose dryness.   Eyes: Negative for redness and dryness.  Respiratory: Negative for shortness of breath and difficulty breathing.   Cardiovascular: Positive for hypertension and irregular heartbeat. Negative for chest pain, palpitations and swelling in legs/feet.  Gastrointestinal: Positive for constipation. Negative for diarrhea.  Endocrine: Negative for increased urination.  Musculoskeletal: Positive for arthralgias, joint pain, myalgias, morning stiffness and myalgias. Negative for joint swelling,  muscle weakness and muscle tenderness.  Skin: Negative for color change, rash, hair loss, nodules/bumps, skin tightness, ulcers and sensitivity to sunlight.  Allergic/Immunologic: Negative for susceptible to infections.  Neurological: Negative for dizziness, fainting, memory loss and night sweats.  Hematological: Negative for swollen glands.  Psychiatric/Behavioral: Positive for depressed mood and sleep disturbance. The patient is not nervous/anxious.     PMFS History:  Patient Active Problem List   Diagnosis Date Noted  . Status post internal cardiac defibrillator procedure 05/29/2016  . Vitamin D deficiency 05/20/2016  . History of pacemaker 05/20/2016  . History of stroke/[place]  05/20/2016  . Primary osteoarthritis of both hands 03/10/2016  . DDD cervical spine 03/10/2016  . 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 Chronic left shoulder pain 03/10/2016  . Trochanteric bursitis of right hip 03/10/2016  . Other fatigue 03/10/2016  . Myalgia 03/10/2016  . Other chronic pain 03/10/2016  . 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 Typical atrial flutter ([place])   . PVC's (premature ventricular contractions) 01/30/2015  . Pacemaker 04/30/2013  . MGUS (monoclonal gammopathy of unknown significance) 02/17/2013  . Orthostatic hypotension 04/11/2011  . Long term current use of anticoagulant therapy 03/24/2011  . S/P AVR 03/24/2011  . Pleural effusion 03/24/2011  . Hypothyroidism 11/22/2010  . Atrial flutter ([place]) 08/19/2010  . CHEST PAIN 04/06/2010  . HYPERLIPIDEMIA-MIXED 12/08/2007  . PRIMARY HYPERCOAGULABLE STATE 12/08/2007  . ANGINA, STABLE/EXERTIONAL 12/08/2007  . CAD, NATIVE VESSEL 12/08/2007  . AORTIC STENOSIS/ INSUFFICIENCY, NON-RHEUMATIC 12/08/2007  . Atrial fibrillation ([place]) 12/08/2007  . Chronotropic incompetence with sinus node dysfunction (  [place]) 12/08/2007    Past Medical History:  Diagnosis Date  . Aortic stenosis    moderate aortic stenosis  . Arthritis   .  Benign prostatic hypertrophy   . 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  . Coronary artery disease    status post multiple prior percutaneous coronary interventions, microvascular angina per Dr Olevia Perches  . Heart murmur   . Hypercoagulable state ([place])    chronically anticoagulated with coumadin  . Hyperlipidemia   . Hyperthyroidism   . Hypothyroidism    Dr. Elyse Hsu  . MGUS (monoclonal gammopathy of unknown significance) 02/17/2013  . Paroxysmal atrial fibrillation ([place])    DR. Lia Foyer,   . Stroke [facility])    1990    Family History  Problem Relation Age of Onset  . Heart disease Brother     Twin brother has coronary disease and recent AVR for AS  . Anesthesia problems Neg Hx   . Hypotension Neg Hx   . Malignant hyperthermia Neg Hx   . Pseudochol deficiency Neg Hx    Past Surgical History:  Procedure Laterality Date  . AORTIC VALVE REPLACEMENT  03/15/2011   Procedure: AORTIC VALVE REPLACEMENT (AVR);  Surgeon: Gaye Pollack, MD;  Location: [place];  Service: Open Heart Surgery;  Laterality: N/A;  . APPENDECTOMY    . CARDIAC CATHETERIZATION     11  . CARDIOVERSION    . CARDIOVERSION  04/15/2011   Procedure: CARDIOVERSION;  Surgeon: Loralie Champagne, MD;  Location: [place];  Service: Cardiovascular;  Laterality: N/A;  . CARDIOVERSION N/A 09/11/2014   Procedure: CARDIOVERSION;  Surgeon: Sueanne Margarita, MD;  Location: [facility] ENDOSCOPY;  Service: Cardiovascular;  Laterality: N/A;  . CARDIOVERSION N/A 06/27/2015   Procedure: CARDIOVERSION;  Surgeon: Thayer Headings, MD;  Location: [facility] ENDOSCOPY;  Service: Cardiovascular;  Laterality: N/A;  . CARDIOVERSION N/A 07/04/2015   Procedure: CARDIOVERSION;  Surgeon: Evans Lance, MD;  Location: [place];  Service: Cardiovascular;  Laterality: N/A;  . EP IMPLANTABLE DEVICE N/A 06/16/2015    Procedure: Pacemaker Implant;  Surgeon: Evans Lance, MD;  Location: [place] CV LAB;  Service: Cardiovascular;  Laterality: N/A;  . hemrrhoidectomy    . LEFT AND RIGHT HEART CATHETERIZATION WITH CORONARY ANGIOGRAM Bilateral 02/01/2011   Procedure: LEFT AND RIGHT HEART CATHETERIZATION WITH CORONARY ANGIOGRAM;  Surgeon: Hillary Bow, MD;  Location: [facility] CATH LAB;  Service: Cardiovascular;  Laterality: Bilateral;  . MAZE  03/15/2011   Procedure: MAZE;  Surgeon: Gaye Pollack, MD;  Location: [place];  Service: Open Heart Surgery;  Laterality: N/A;  . PACEMAKER INSERTION  1991   Guidant PPM, most recent Generator Change by Dr Olevia Perches was 08/22/06  . TEE WITHOUT CARDIOVERSION  04/15/2011   Procedure: TRANSESOPHAGEAL ECHOCARDIOGRAM (TEE);  Surgeon: Loralie Champagne, MD;  Location: [place];  Service: Cardiovascular;  Laterality: N/A;  . TEE WITHOUT CARDIOVERSION N/A 09/11/2014   Procedure: TRANSESOPHAGEAL ECHOCARDIOGRAM (TEE);  Surgeon: Sueanne Margarita, MD;  Location: [facility] ENDOSCOPY;  Service: Cardiovascular;  Laterality: N/A;   Social History   Social History Narrative   Married. Has grown children   Retired Horticulturist, commercial MD           Objective: Vital Signs: BP 118/76   Pulse 82   Resp 18    Physical Exam  Constitutional: He is oriented to person, place, and time. He appears  well-developed and well-nourished.  HENT:  Head: Normocephalic and atraumatic.  Eyes: Conjunctivae and EOM are normal. Pupils are equal, round, and reactive to light.  Neck: Normal range of motion. Neck supple.  Cardiovascular: Normal rate, regular rhythm and normal heart sounds.   Pacemaker  Pulmonary/Chest: Effort normal and breath sounds normal.  Abdominal: Soft. Bowel sounds are normal.  Neurological: He is alert and oriented to person, place, and time.  Skin: Skin is warm and dry. Capillary refill takes less than 2 seconds.  Psychiatric: He has a normal mood and affect. His behavior is normal.  Nursing note and  vitals reviewed.    Musculoskeletal Exam: C-spine limited range of motion. Thoracic and lumbar spine fairly good range of motion. Shoulder joint abduction was limited to 120 bilaterally. Elbow joints wrist joints are good range of motion. He has no synovitis or MCP joints. He had subluxation and thickening of all PIP/DIP joints consistent with osteoarthritis in his hands. Hip joints are good range of motion. Tenderness on palpation over right trochanteric bursa. Knee joints are good range of motion there was no swelling over bilateral ankle joints. CDAI Exam: No CDAI exam completed.    Investigation: Findings:  04/05/2016 CBC hemoglobin 12.4, CMP normal, 04/30/2016 urine drug screen consistent with benzodiazepine and opiates    Imaging: No results found.  Speciality Comments: No specialty comments available.    Procedures:  No procedures performed Allergies: Contrast media [iodinated diagnostic agents]   Assessment / Plan:     Visit Diagnoses: Myalgia: He continues to have some generalized aches and pains.  Other fatigue: Fatigue appears to be related to chronic insomnia due to nocturnal pain.  Other chronic pain: Due to severe arthritis in multiple joints. He is on Vicodin which controlled his pain symptoms. Narcotic agreement was renewed today. He also feels depressed to some extent due to chronic pain. Different treatment options and their side effects were discussed. I believe for chronic pain Cymbalta may be a good option to add. Indications side effects contraindications were discussed at length. I also advised him to discuss with his electrophysiologist before starting the medication. Possibility of urinary retention was also discussed. After reviewing indications and side effects of prescription for Cymbalta 30 mg by mouth daily at bedtime was called in with 2 refills.  Primary osteoarthritis of both hands: He has severe arthritis and DIP PIP joints.  Chronic left shoulder  pain: He has arthritis which continues to cause problems  Trochanteric bursitis of right hip: He has chronic pain. ITB and exercise were discussed.  DDD cervical spine: He is been having arm discomfort in his C-spine without any radiculopathy. His stretching exercises were demonstrated and discussed today.  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  Meds ordered this encounter  Medications  . DULoxetine (CYMBALTA) 30 MG capsule    Sig: Take 1 capsule (30 mg total) by mouth daily.    Dispense:  30 capsule    Refill:  2    Face-to-face time spent with patient was 30 minutes. 50% of time was spent in counseling and coordination of care.  Follow-Up Instructions: Return in about 3 months (around 08/31/2016) for Osteoarthritis.  Bo Merino, MD  Note - This record has been created using Editor, commissioning.  Chart creation errors have been sought, but may not always  have been located. Such creation errors do not reflect on  the standard of medical care.

## 2016-05-31 MED FILL — ROSUVASTATIN CALCIUM 20 MG: 20 | 90 days supply | Qty: 90 | Fill #2 | Status: TO

## 2016-06-01 ENCOUNTER — Ambulatory Visit (INDEPENDENT_AMBULATORY_CARE_PROVIDER_SITE_OTHER): Payer: Medicare Other | Admitting: Rheumatology

## 2016-06-01 ENCOUNTER — Encounter: Payer: Self-pay | Admitting: Rheumatology

## 2016-06-01 VITALS — BP 118/76 | HR 82 | Resp 18

## 2016-06-01 DIAGNOSIS — M47816 Spondylosis without myelopathy or radiculopathy, lumbar region: Secondary | ICD-10-CM

## 2016-06-01 DIAGNOSIS — R5383 Other fatigue: Secondary | ICD-10-CM | POA: Diagnosis not present

## 2016-06-01 DIAGNOSIS — D472 Monoclonal gammopathy: Secondary | ICD-10-CM

## 2016-06-01 DIAGNOSIS — M19041 Primary osteoarthritis, right hand: Secondary | ICD-10-CM

## 2016-06-01 DIAGNOSIS — E559 Vitamin D deficiency, unspecified: Secondary | ICD-10-CM

## 2016-06-01 DIAGNOSIS — Z950 Presence of cardiac pacemaker: Secondary | ICD-10-CM

## 2016-06-01 DIAGNOSIS — Z8673 Personal history of transient ischemic attack (TIA), and cerebral infarction without residual deficits: Secondary | ICD-10-CM

## 2016-06-01 DIAGNOSIS — G7000 Myasthenia gravis without (acute) exacerbation: Secondary | ICD-10-CM

## 2016-06-01 DIAGNOSIS — M791 Myalgia, unspecified site: Secondary | ICD-10-CM

## 2016-06-01 DIAGNOSIS — M25512 Pain in left shoulder: Secondary | ICD-10-CM

## 2016-06-01 DIAGNOSIS — M19042 Primary osteoarthritis, left hand: Secondary | ICD-10-CM

## 2016-06-01 DIAGNOSIS — G8929 Other chronic pain: Secondary | ICD-10-CM

## 2016-06-01 DIAGNOSIS — M7061 Trochanteric bursitis, right hip: Secondary | ICD-10-CM

## 2016-06-01 DIAGNOSIS — M47812 Spondylosis without myelopathy or radiculopathy, cervical region: Secondary | ICD-10-CM

## 2016-06-01 DIAGNOSIS — Z952 Presence of prosthetic heart valve: Secondary | ICD-10-CM

## 2016-06-01 DIAGNOSIS — M5030 Other cervical disc degeneration, unspecified cervical region: Secondary | ICD-10-CM | POA: Diagnosis not present

## 2016-06-01 DIAGNOSIS — D6859 Other primary thrombophilia: Secondary | ICD-10-CM

## 2016-06-01 MED ORDER — DULOXETINE HCL 30 MG PO CPEP: 30.0000 mg | ORAL_CAPSULE | Freq: Every day | ORAL | 2 refills | Status: DC

## 2016-06-01 MED FILL — ELIQUIS 5 MG TABLET: 5 | 90 days supply | Qty: 180 | Fill #0 | Status: TO

## 2016-06-01 MED FILL — DULoxetine HCL 30 MG CPEP: 30 | 30 days supply | Qty: 30 | Fill #0

## 2016-06-01 NOTE — Patient Instructions (Addendum)
Supplements for OA Natural anti-inflammatories  You can purchase these at [employer], [employer] or online.   . Ginger (ginger root or capsules)  . Omega 3 (Fish, flax seeds, chia seeds, walnuts, almonds)  . Tart cherry (dried or extract)   Patient should be under the care of a physician while taking these supplements. This may not be reproduced without the permission of Dr. Bo Merino.  Duloxetine delayed-release capsules What is this medicine? DULOXETINE (doo LOX e teen) is used to treat depression, anxiety, and different types of chronic pain. This medicine may be used for other purposes; ask your health care provider or pharmacist if you have questions. COMMON BRAND NAME(S): Cymbalta, [place] What should I tell my health care provider before I take this medicine? They need to know if you have any of these conditions: -bipolar disorder or a family history of bipolar disorder -glaucoma -kidney disease -liver disease -suicidal thoughts or a previous suicide attempt -taken medicines called MAOIs like Carbex, Eldepryl, Marplan, Nardil, and Parnate within 14 days -an unusual reaction to duloxetine, other medicines, foods, dyes, or preservatives -pregnant or trying to get pregnant -breast-feeding How should I use this medicine? Take this medicine by mouth with a glass of water. Follow the directions on the prescription label. Do not cut, crush or chew this medicine. You can take this medicine with or without food. Take your medicine at regular intervals. Do not take your medicine more often than directed. Do not stop taking this medicine suddenly except upon the advice of your doctor. Stopping this medicine too quickly may cause serious side effects or your condition may worsen. A special MedGuide will be given to you by the pharmacist with each prescription and refill. Be sure to read this information carefully each time. Talk to your pediatrician regarding the use of this  medicine in children. While this drug may be prescribed for children as young as 71 years of age for selected conditions, precautions do apply. Overdosage: If you think you have taken too much of this medicine contact a poison control center or emergency room at once. NOTE: This medicine is only for you. Do not share this medicine with others. What if I miss a dose? If you miss a dose, take it as soon as you can. If it is almost time for your next dose, take only that dose. Do not take double or extra doses. What may interact with this medicine? Do not take this medicine with any of the following medications: -desvenlafaxine -levomilnacipran -linezolid -MAOIs like Carbex, Eldepryl, Marplan, Nardil, and Parnate -methylene blue (injected into a vein) -milnacipran -thioridazine -venlafaxine This medicine may also interact with the following medications: -alcohol -amphetamines -aspirin and aspirin-like medicines -certain antibiotics like ciprofloxacin and enoxacin -certain medicines for blood pressure, heart disease, irregular heart beat -certain medicines for depression, anxiety, or psychotic disturbances -certain medicines for migraine headache like almotriptan, eletriptan, frovatriptan, naratriptan, rizatriptan, sumatriptan, zolmitriptan -certain medicines that treat or prevent blood clots like warfarin, enoxaparin, and dalteparin -cimetidine -fentanyl -lithium -NSAIDS, medicines for pain and inflammation, like ibuprofen or naproxen -phentermine -procarbazine -rasagiline -sibutramine -St. John's wort -theophylline -tramadol -tryptophan This list may not describe all possible interactions. Give your health care provider a list of all the medicines, herbs, non-prescription drugs, or dietary supplements you use. Also tell them if you smoke, drink alcohol, or use illegal drugs. Some items may interact with your medicine. What should I watch for while using this medicine? Tell your  doctor if your symptoms do  not get better or if they get worse. Visit your doctor or health care professional for regular checks on your progress. Because it may take several weeks to see the full effects of this medicine, it is important to continue your treatment as prescribed by your doctor. Patients and their families should watch out for new or worsening thoughts of suicide or depression. Also watch out for sudden changes in feelings such as feeling anxious, agitated, panicky, irritable, hostile, aggressive, impulsive, severely restless, overly excited and hyperactive, or not being able to sleep. If this happens, especially at the beginning of treatment or after a change in dose, call your health care professional. Dennis Bast may get drowsy or dizzy. Do not drive, use machinery, or do anything that needs mental alertness until you know how this medicine affects you. Do not stand or sit up quickly, especially if you are an older patient. This reduces the risk of dizzy or fainting spells. Alcohol may interfere with the effect of this medicine. Avoid alcoholic drinks. This medicine can cause an increase in blood pressure. This medicine can also cause a sudden drop in your blood pressure, which may make you feel faint and increase the chance of a fall. These effects are most common when you first start the medicine or when the dose is increased, or during use of other medicines that can cause a sudden drop in blood pressure. Check with your doctor for instructions on monitoring your blood pressure while taking this medicine. Your mouth may get dry. Chewing sugarless gum or sucking hard candy, and drinking plenty of water may help. Contact your doctor if the problem does not go away or is severe. What side effects may I notice from receiving this medicine? Side effects that you should report to your doctor or health care professional as soon as possible: -allergic reactions like skin rash, itching or hives, swelling  of the face, lips, or tongue -anxious -breathing problems -confusion -changes in vision -chest pain -confusion -elevated mood, decreased need for sleep, racing thoughts, impulsive behavior -eye pain -fast, irregular heartbeat -feeling faint or lightheaded, falls -feeling agitated, angry, or irritable -hallucination, loss of contact with reality -high blood pressure -loss of balance or coordination -palpitations -redness, blistering, peeling or loosening of the skin, including inside the mouth -restlessness, pacing, inability to keep still -seizures -stiff muscles -suicidal thoughts or other mood changes -trouble passing urine or change in the amount of urine -trouble sleeping -unusual bleeding or bruising -unusually weak or tired -vomiting -yellowing of the eyes or skin Side effects that usually do not require medical attention (report to your doctor or health care professional if they continue or are bothersome): -change in sex drive or performance -change in appetite or weight -constipation -dizziness -dry mouth -headache -increased sweating -nausea -tired This list may not describe all possible side effects. Call your doctor for medical advice about side effects. You may report side effects to FDA at 1-800-FDA-1088. Where should I keep my medicine? Keep out of the reach of children. Store at room temperature between 20 and 25 degrees C (68 to 77 degrees F). Throw away any unused medicine after the expiration date. NOTE: This sheet is a summary. It may not cover all possible information. If you have questions about this medicine, talk to your doctor, pharmacist, or health care provider.  2018 Elsevier/Gold Standard (2015-07-17 18:16:03)

## 2016-06-01 NOTE — Progress Notes (Signed)
Pharmacy Note  Subjective:   Patient presents today to the [facility] to see Dr. Estanislado Pandy.  Patient seen by the pharmacist for counseling on duloxetine (Cymbalta).    Objective: Vitals:   06/01/16 1122  BP: 118/76  Pulse: 82  Resp: 18   CMP     Component Value Date/Time   NA 140 04/05/2016 1408   NA 141 04/05/2013 1159   K 4.7 04/05/2016 1408   K 3.8 04/05/2013 1159   CL 99 04/05/2016 1408   CO2 25 04/05/2016 1408   CO2 30 (H) 04/05/2013 1159   GLUCOSE 92 04/05/2016 1408   GLUCOSE 134 (H) 11/20/2015 0015   GLUCOSE 122 04/05/2013 1159   BUN 14 04/05/2016 1408   BUN 18.3 04/05/2013 1159   CREATININE 0.85 04/05/2016 1408   CREATININE 0.88 11/26/2013 1142   CREATININE 1.1 04/05/2013 1159   CALCIUM 9.3 04/05/2016 1408   CALCIUM 9.7 04/05/2013 1159   PROT 7.0 04/05/2016 1408   PROT 7.6 04/05/2013 1159   ALBUMIN 3.9 04/05/2016 1408   ALBUMIN 4.3 04/05/2013 1159   AST 18 04/05/2016 1408   AST 19 04/05/2013 1159   ALT 23 04/05/2016 1408   ALT 16 04/05/2013 1159   ALKPHOS 83 04/05/2016 1408   ALKPHOS 52 04/05/2013 1159   BILITOT 0.3 04/05/2016 1408   BILITOT 0.45 04/05/2013 1159   GFRNONAA 83 04/05/2016 1408   GFRNONAA 56 (L) 11/16/2010 1355   GFRAA 96 04/05/2016 1408   GFRAA >60 11/16/2010 1355   Assessment/Plan: Patient was prescribed duloxetine 30 mg daily.  Patient was counseled on the purpose, proper use, and adverse effects of duloxetine including nausea, constipation, dizziness, confusion, blurry vision, increased blood pressure, increased sweating, headache, and urinary retention.  Reviewed drug interactions.  Noted plasma concentrations and pharmacologic effects of metoprolol may be increased by duloxetine.  Patient was advised to monitor the pharmacologic effects of metoprolol including blood pressure and heart rate.  Patient voiced understanding.  Reviewed black boxed warning of increased risk of suicidal thoughts in young adults.  Provided patient  with educational materials on duloxetine and answered all questions.    Elisabeth Most, Pharm.D., BCPS Clinical Pharmacist Pager: [phone] Phone: [phone] 06/01/2016 12:35 PM

## 2016-06-07 DIAGNOSIS — C4442 Squamous cell carcinoma of skin of scalp and neck: Secondary | ICD-10-CM | POA: Diagnosis not present

## 2016-06-07 DIAGNOSIS — Z85828 Personal history of other malignant neoplasm of skin: Secondary | ICD-10-CM | POA: Diagnosis not present

## 2016-06-14 ENCOUNTER — Telehealth: Payer: Self-pay | Admitting: Cardiology

## 2016-06-14 ENCOUNTER — Encounter: Payer: Medicare Other | Admitting: *Deleted

## 2016-06-14 NOTE — Telephone Encounter (Signed)
Spoke with pt and reminded pt of remote transmission that is due today. Pt verbalized understanding.   

## 2016-06-16 MED FILL — predniSONE 10 MG TABS: 10 | 30 days supply | Qty: 30 | Fill #4

## 2016-06-18 ENCOUNTER — Encounter: Payer: Self-pay | Admitting: Cardiology

## 2016-06-18 ENCOUNTER — Telehealth: Payer: Self-pay | Admitting: Rheumatology

## 2016-06-18 MED ORDER — HYDROCODONE-ACETAMINOPHEN 10-325 MG PO TABS: 1.0000 | ORAL_TABLET | Freq: Two times a day (BID) | ORAL | 0 refills | Status: DC | PRN

## 2016-06-18 NOTE — Telephone Encounter (Signed)
Patient is requesting refill of hydrocodone.  

## 2016-06-18 NOTE — Telephone Encounter (Signed)
ok 

## 2016-06-18 NOTE — Telephone Encounter (Signed)
06/01/16 last visit [facility] in March + Benzos, Barbituates Hyodrocodone  Consistent with meds used  No return visit sch sent message to schedule  Ok to refill Hydrocodone?

## 2016-06-23 MED FILL — HYDROCODON-APAP 10-325: 10-325 | 50 days supply | Qty: 100 | Fill #0

## 2016-06-29 ENCOUNTER — Telehealth: Payer: Self-pay | Admitting: Cardiovascular Disease

## 2016-06-29 NOTE — Telephone Encounter (Signed)
New message    Pt c/o of Chest Pain: STAT if CP now or developed within 24 hours  1. Are you having CP right now? Last night  2. Are you experiencing any other symptoms (ex. SOB, nausea, vomiting, sweating)? SOB  3. How long have you been experiencing CP? Pt said to put down forever  4. Is your CP continuous or coming and going? With exertion   5. Have you taken Nitroglycerin? no?

## 2016-06-29 NOTE — Telephone Encounter (Signed)
Spoke with patient who was complaining of chest pain and shortness of breath. Patient stated an episode happened last night where he was experiencing shortness of breath, chest pain and pain in his shoulder. Patient states the chest pain happens mostly with exertion. Patient wanting to see Dr. Burt Knack. Phone handed to [place], [place]..Patient stated he did not take NTG. Pt stated Dr. Wille Glaser (brother) was with patient the other day and pt was short of breath. Advised patient Ander Purpura will call him back tomorrow to make an appointment with Dr. Burt Knack for his chest pain.

## 2016-06-30 NOTE — Telephone Encounter (Signed)
Anderson Malta LPN contacted me by phone on 06/29/16 and I advised that the pt can be added to Dr Antionette Char next clinic day on 07/01/16.  Anderson Malta has scheduled this appointment already.

## 2016-07-01 ENCOUNTER — Ambulatory Visit: Payer: Medicare Other | Admitting: Cardiovascular Disease

## 2016-07-02 ENCOUNTER — Encounter: Payer: Self-pay | Admitting: Cardiovascular Disease

## 2016-07-02 ENCOUNTER — Ambulatory Visit (INDEPENDENT_AMBULATORY_CARE_PROVIDER_SITE_OTHER): Payer: Medicare Other | Admitting: Cardiovascular Disease

## 2016-07-02 VITALS — BP 120/78 | HR 79 | Ht 74.0 in | Wt 219.2 lb

## 2016-07-02 DIAGNOSIS — R079 Chest pain, unspecified: Secondary | ICD-10-CM

## 2016-07-02 DIAGNOSIS — I350 Nonrheumatic aortic (valve) stenosis: Secondary | ICD-10-CM

## 2016-07-02 DIAGNOSIS — R7309 Other abnormal glucose: Secondary | ICD-10-CM | POA: Diagnosis not present

## 2016-07-02 DIAGNOSIS — I480 Paroxysmal atrial fibrillation: Secondary | ICD-10-CM | POA: Diagnosis not present

## 2016-07-02 LAB — COMPREHENSIVE METABOLIC PANEL
A/G RATIO: 1.7 (ref 1.2–2.2)
ALBUMIN: 4.3 g/dL (ref 3.5–4.8)
ALK PHOS: 71 IU/L (ref 39–117)
ALT: 20 IU/L (ref 0–44)
AST: 20 IU/L (ref 0–40)
BILIRUBIN TOTAL: 0.4 mg/dL (ref 0.0–1.2)
BUN / CREAT RATIO: 16 (ref 10–24)
BUN: 16 mg/dL (ref 8–27)
CHLORIDE: 101 mmol/L (ref 96–106)
CO2: 23 mmol/L (ref 18–29)
Calcium: 9 mg/dL (ref 8.6–10.2)
Creatinine, Ser: 0.99 mg/dL (ref 0.76–1.27)
GFR calc non Af Amer: 72 mL/min/{1.73_m2} (ref >59)
GFR, EST AFRICAN AMERICAN: 83 mL/min/{1.73_m2} (ref >59)
GLOBULIN, TOTAL: 2.6 g/dL (ref 1.5–4.5)
Glucose: 124 mg/dL — ABNORMAL HIGH (ref 65–99)
POTASSIUM: 4.5 mmol/L (ref 3.5–5.2)
SODIUM: 143 mmol/L (ref 134–144)
TOTAL PROTEIN: 6.9 g/dL (ref 6.0–8.5)

## 2016-07-02 LAB — CBC
Hematocrit: 34.9 % — ABNORMAL LOW (ref 37.5–51.0)
Hemoglobin: 11.6 g/dL — ABNORMAL LOW (ref 13.0–17.7)
MCH: 28.7 pg (ref 26.6–33.0)
MCHC: 33.2 g/dL (ref 31.5–35.7)
MCV: 86 fL (ref 79–97)
PLATELETS: 209 10*3/uL (ref 150–379)
RBC: 4.04 x10E6/uL — ABNORMAL LOW (ref 4.14–5.80)
RDW: 14.6 % (ref 12.3–15.4)
WBC: 6.2 10*3/uL (ref 3.4–10.8)

## 2016-07-02 LAB — LIPID PANEL
CHOL/HDL RATIO: 2.3 ratio (ref 0.0–5.0)
CHOLESTEROL TOTAL: 162 mg/dL (ref 100–199)
HDL: 71 mg/dL (ref >39)
LDL CALC: 67 mg/dL (ref 0–99)
Triglycerides: 120 mg/dL (ref 0–149)
VLDL Cholesterol Cal: 24 mg/dL (ref 5–40)

## 2016-07-02 LAB — T4, FREE: Free T4: 1.06 ng/dL (ref 0.82–1.77)

## 2016-07-02 LAB — PROTIME-INR
INR: 1.1 (ref 0.8–1.2)
Prothrombin Time: 11.9 s (ref 9.1–12.0)

## 2016-07-02 LAB — HEMOGLOBIN A1C
Est. average glucose Bld gHb Est-mCnc: 117 mg/dL
Hgb A1c MFr Bld: 5.7 % — ABNORMAL HIGH (ref 4.8–5.6)

## 2016-07-02 LAB — TSH: TSH: 2.09 u[IU]/mL (ref 0.450–4.500)

## 2016-07-02 MED ORDER — PREDNISONE 20 MG PO TABS: ORAL_TABLET | ORAL | 0 refills | Status: DC

## 2016-07-02 MED FILL — predniSONE 20 MG TABS: 20 | 2 days supply | Qty: 6 | Fill #0

## 2016-07-02 NOTE — Patient Instructions (Addendum)
Medication Instructions:  Your physician recommends that you continue on your current medications as directed. Please refer to the Current Medication list given to you today.  Labwork: Your physician recommends that you have lab work today: LIPID, TSH, Free T4, HgbA1c, CMP, CBC and PT/INR  Testing/Procedures: Your physician has requested that you have a cardiac catheterization. Cardiac catheterization is used to diagnose and/or treat various heart conditions. Doctors may recommend this procedure for a number of different reasons. The most common reason is to evaluate chest pain. Chest pain can be a symptom of coronary artery disease (CAD), and cardiac catheterization can show whether plaque is narrowing or blocking your heart's arteries. This procedure is also used to evaluate the valves, as well as measure the blood flow and oxygen levels in different parts of your heart. For further information please visit [email or web address]. Please follow instruction sheet, as given.    [place] [facility] [address], [address] [place] [address] Dept: [phone] Loc: [phone]  [name], MD  07/02/2016  You are scheduled for a Cardiac Catheterization on Tuesday, May 8 with Dr. Sherren Mocha.  1. Please arrive at the [facility] (Main Entrance A) at [facility]: [place], [place] [address] at 7:00 AM (two hours before your procedure to ensure your preparation). Free valet parking service is available.   Special note: Every effort is made to have your procedure done on time. Please understand that emergencies sometimes delay scheduled procedures.  2. Diet: Do not eat or drink anything after midnight prior to your procedure except sips of water to take medications.  3. Labs: None needed.  4. Medication instructions in preparation for your procedure:  Stop taking Eliquis (Apixiban) on  Sunday, May 6. You will take your last dose of Eliquis on Saturday night, May 5.  Take Prednisone 60mg  the night before procedure and at 6:30 AM the morning of procedure.    On the morning of your procedure, take your Aspirin and any morning medicines NOT listed above.  You may use sips of water.  5. Plan for one night stay--bring personal belongings.  6. Bring a current list of your medications and current insurance cards.  7. You MUST have a responsible person to drive you home.  8. Someone MUST be with you the first 24 hours after you arrive home or your discharge will be delayed.  9. Please wear clothes that are easy to get on and off and wear slip-on shoes.  Thank you for allowing [place] to care for you!   -- [facility]   Follow-Up: We will arrange further follow-up after your cardiac catheterization.   Any Other Special Instructions Will Be Listed Below (If Applicable).     If you need a refill on your cardiac medications before your next appointment, please call your pharmacy.

## 2016-07-02 NOTE — Progress Notes (Addendum)
Cardiology Office Note Date:  07/02/2016   ID:  Kevin Buff, [name], DOB [birth year], MRN [id number]  PCP:  Limmie Patricia, [name]  Cardiologist:  Sherren Mocha, [name]    No chief complaint on file.    History of Present Illness: Kevin Buff, [name] is a 82 y.o. male who presents for follow-up evaluation.  [name] returns for follow-up of multiple cardiac issues. He is 82 years old and has been followed for coronary artery disease, cardiac syndrome X., aortic stenosis status post bioprosthetic aortic valve replacement, and atrial fibrillation. He underwent aortic valve replacement with a 25 mm Edwards pericardial bioprosthesis and left-sided radiofrequency cryo- Cox maze procedure with ligation of the left atrial appendage in 2013. He's had a lot of problems with autonomic dysfunction, supine hypertension, and orthostatic hypotension. Over the past 6 months he has been followed closely by Kevin. Lovena Le for his multiple arrhythmias. There was a discussion about dofetilide but the patient preferred to continue with a rate control strategy.  Kevin. Lorin [name] is here alone today. He continues to have worsening shortness of breath. He also has begun experiencing exertional angina. He complains of central and left-sided chest pressure radiating to his left shoulder with walking. Symptoms are worse than they were 6 months ago. He has long-standing microvascular angina, but symptoms are more pronounced. He also complains of worsening exercise intolerance, increased fatigue, and progressive shortness of breath. Symptoms of dizziness and postural hypotension are unchanged. He reports no bleeding problems on chronic anticoagulation. Chest pain symptoms are relieved with rest. He has not required sublingual nitroglycerin.   Past Medical History:  Diagnosis Date  . Aortic stenosis    moderate aortic stenosis  . Arthritis   . Benign prostatic hypertrophy   . Chronotropic incompetence with sinus node dysfunction  (HCC)    Status post Guidant dual-mode, dual-pacing, dual-sensing  pacemaker   implantation now programmed to AAI with recent generator change.  . Coronary artery disease    status post multiple prior percutaneous coronary interventions, microvascular angina per Kevin Olevia Perches  . Heart murmur   . Hypercoagulable state ([place])    chronically anticoagulated with coumadin  . Hyperlipidemia   . Hyperthyroidism   . Hypothyroidism    Kevin. Elyse Hsu  . MGUS (monoclonal gammopathy of unknown significance) 02/17/2013  . Paroxysmal atrial fibrillation ([place])    Kevin. Lia Foyer,   . Stroke [facility])    1990    Past Surgical History:  Procedure Laterality Date  . AORTIC VALVE REPLACEMENT  03/15/2011   Procedure: AORTIC VALVE REPLACEMENT (AVR);  Surgeon: Gaye Pollack, [name];  Location: [place];  Service: Open Heart Surgery;  Laterality: N/A;  . APPENDECTOMY    . CARDIAC CATHETERIZATION     11  . CARDIOVERSION    . CARDIOVERSION  04/15/2011   Procedure: CARDIOVERSION;  Surgeon: Loralie Champagne, [name];  Location: [place];  Service: Cardiovascular;  Laterality: N/A;  . CARDIOVERSION N/A 09/11/2014   Procedure: CARDIOVERSION;  Surgeon: Sueanne Margarita, [name];  Location: [facility] ENDOSCOPY;  Service: Cardiovascular;  Laterality: N/A;  . CARDIOVERSION N/A 06/27/2015   Procedure: CARDIOVERSION;  Surgeon: Thayer Headings, [name];  Location: [facility] ENDOSCOPY;  Service: Cardiovascular;  Laterality: N/A;  . CARDIOVERSION N/A 07/04/2015   Procedure: CARDIOVERSION;  Surgeon: Evans Lance, [name];  Location: [place];  Service: Cardiovascular;  Laterality: N/A;  . EP IMPLANTABLE DEVICE N/A 06/16/2015   Procedure: Pacemaker Implant;  Surgeon: Evans Lance, [name];  Location: [place] CV LAB;  Service:  Cardiovascular;  Laterality: N/A;  . hemrrhoidectomy    . LEFT AND RIGHT HEART CATHETERIZATION WITH CORONARY ANGIOGRAM Bilateral 02/01/2011   Procedure: LEFT AND RIGHT HEART CATHETERIZATION WITH CORONARY ANGIOGRAM;  Surgeon: Hillary Bow, [name];   Location: [facility] CATH LAB;  Service: Cardiovascular;  Laterality: Bilateral;  . MAZE  03/15/2011   Procedure: MAZE;  Surgeon: Gaye Pollack, [name];  Location: [place];  Service: Open Heart Surgery;  Laterality: N/A;  . PACEMAKER INSERTION  1991   Guidant PPM, most recent Generator Change by Kevin Olevia Perches was 08/22/06  . TEE WITHOUT CARDIOVERSION  04/15/2011   Procedure: TRANSESOPHAGEAL ECHOCARDIOGRAM (TEE);  Surgeon: Loralie Champagne, [name];  Location: [place];  Service: Cardiovascular;  Laterality: N/A;  . TEE WITHOUT CARDIOVERSION N/A 09/11/2014   Procedure: TRANSESOPHAGEAL ECHOCARDIOGRAM (TEE);  Surgeon: Sueanne Margarita, [name];  Location: [facility] ENDOSCOPY;  Service: Cardiovascular;  Laterality: N/A;    Current Outpatient Prescriptions  Medication Sig Dispense Refill  . apixaban (ELIQUIS) 5 MG TABS tablet Take 1 tablet (5 mg total) by mouth 2 (two) times daily. 180 tablet 3  . aspirin EC 81 MG tablet Take 81 mg by mouth daily.    . bisacodyl (DULCOLAX) 5 MG EC tablet Take 5 mg by mouth daily as needed (constipation).     . butalbital-acetaminophen-caffeine (FIORICET, ESGIC) 50-325-40 MG per tablet Take 1 tablet by mouth 2 (two) times daily as needed (osteo-arthritis pain).     . Coenzyme Q10 (COQ10) 100 MG CAPS Take 100 mg by mouth daily.    . Diclofenac Sodium 1.5 % SOLN Take 1 application by mouth 2 (two) times daily as needed (Knee pain).     Marland Kitchen doxazosin (CARDURA) 4 MG tablet Take 4 mg by mouth at bedtime.     Marland Kitchen HYDROcodone-acetaminophen (NORCO) 10-325 MG tablet Take 1 tablet by mouth 2 (two) times daily as needed. 100 tablet 0  . LORazepam (ATIVAN) 1 MG tablet Take 1 mg by mouth at bedtime.     . magnesium oxide (MAG-OX) 400 MG tablet Take 400 mg by mouth daily.    . metoprolol succinate (TOPROL-XL) 100 MG 24 hr tablet Take 1 tablet (100 mg total) by mouth 2 (two) times daily. 180 tablet 3  . Multiple Vitamin (MULTIVITAMIN WITH MINERALS) TABS tablet Take 1 tablet by mouth daily.    . nitroGLYCERIN (NITROSTAT)  0.4 MG SL tablet Place 1 tablet (0.4 mg total) under the tongue every 5 (five) minutes as needed for chest pain. 25 tablet 3  . OVER THE COUNTER MEDICATION Apply 1 patch topically daily as needed (neck and shoulder pain). Chinese pain patch    . potassium chloride SA (KLOR-CON M20) 20 MEQ tablet Take 1 tablet (20 mEq total) by mouth daily. 90 tablet 3  . predniSONE (DELTASONE) 5 MG tablet as directed. Also has a 10 mg tablet - take as directed     No current facility-administered medications for this visit.     Allergies:   Contrast media [iodinated diagnostic agents]   Social History:  The patient  reports that he has never smoked. He has never used smokeless tobacco. He reports that he does not drink alcohol or use drugs.   Family History:  The patient's  family history includes Heart disease in his brother.    ROS:  Please see the history of present illness.  All other systems are reviewed and negative.    PHYSICAL EXAM: VS:  BP 120/78   Pulse 79   Ht 6\' 2"  (  1.88 m)   Wt 219 lb 3 oz (99.4 kg)   BMI 28.14 kg/m  , BMI Body mass index is 28.14 kg/m. GEN: Well nourished, well developed, in no acute distress  HEENT: normal  Neck: no JVD, no masses. No carotid bruits Cardiac: RRR with 2/6 SEM at the RUSB               Respiratory:  clear to auscultation bilaterally, normal work of breathing GI: soft, nontender, nondistended, + BS MS: no deformity or atrophy  Ext: no pretibial edema, pedal pulses 2+= bilaterally Skin: warm and dry, no rash Neuro:  Strength and sensation are intact Psych: euthymic mood, full affect  EKG:  EKG is ordered today. The ekg ordered today shows atrial flutter with variable AV block, right bundle branch block, left anterior fascicular block  Recent Labs: 11/20/2015: Hemoglobin 12.1 04/05/2016: ALT 23; BUN 14; Creatinine, Ser 0.85; Platelets 289; Potassium 4.7; Sodium 140   Lipid Panel     Component Value Date/Time   CHOL 259 (H) 09/10/2014 1530    TRIG 113 09/10/2014 1530   HDL 77 09/10/2014 1530   CHOLHDL 3.4 09/10/2014 1530   VLDL 23 09/10/2014 1530   LDLCALC 159 (H) 09/10/2014 1530   LDLDIRECT 117.9 11/16/2010 1355      Wt Readings from Last 3 Encounters:  07/02/16 219 lb 3 oz (99.4 kg)  04/30/16 221 lb (100.2 kg)  04/12/16 220 lb (99.8 kg)     Cardiac Studies Reviewed: Echo 08/11/2015: Study Conclusions  - Left ventricle: The cavity size was mildly dilated. Wall   thickness was normal. Systolic function was normal. The estimated   ejection fraction was in the range of 55% to 60%. Wall motion was   normal; there were no regional wall motion abnormalities. The   study is not technically sufficient to allow evaluation of LV   diastolic function. - Aortic valve: A bioprosthesis was present and functioning   normally. Mean gradient (S): 12 mm Hg. Peak gradient (S): 27 mm   Hg. Valve area (VTI): 1.48 cm^2. Valve area (Vmax): 1.44 cm^2.   Valve area (Vmean): 1.65 cm^2. - Mitral valve: Calcified annulus. There was mild regurgitation   directed centrally. - Left atrium: The atrium was severely dilated. - Right ventricle: The cavity size was mildly dilated. - Right atrium: The atrium was moderately dilated. - Pulmonary arteries: Systolic pressure was mildly increased. PA   peak pressure: 41 mm Hg (S).  Cardiac Cath 02/01/2011: Procedural Details: The right groin was prepped, draped, and anesthetized with 1% lidocaine. Using the modified Seldinger technique a 5 French sheath was placed in the right femoral artery and a 7 French sheath was placed in the right femoral vein. A Swan-Ganz catheter was used for the right heart catheterization. Standard protocol was followed for recording of right heart pressures and sampling of oxygen saturations. Fick cardiac output was calculated. Standard Judkins catheters were used for selective coronary angiography and left ventriculography. There were no immediate procedural complications. The  patient was transferred to the post catheterization recovery area for further monitoring.  Procedural Findings: Hemodynamics RA 7/6/5 RV 26/5 PA 25/13 PCWP 11 LV Not done AO 147/82 (106)  Oxygen saturations: PA 69% AO 96%  Cardiac Output (Fick) 6.6 L/min  Cardiac Index (Fick) 2.85 L/min/m2 Thermodilution  CO:  5.46 L/min Thermodilution Index:  2.37 L/min/M2              Coronary angiography: Coronary dominance: right  Left mainstem: The left  main coronary artery was a large-caliber vessel that was free of significant disease.   LAD:  The left anterior descending artery courses to the cardiac apex there is calcification and mild luminal irregularity throughout the first septal perforator has about 50% narrowing and there is a second septal perforator that has approximately 80-90% narrowing but is a small caliber vessel. The distal left anterior descending artery wraps the apex and provides twin apical vessels. Other than luminal regularity is free of critical disease.  Left Circumflex: The circumflex coronary artery provides a ramus intermedius and a large AV circumflex that supplies a marginal branch. The  ramus intermedius  bifurcates into superior branch has approximately 40-50% narrowing but supplies a moderate size distal vessel the inferior subbranch appears to be widely patent. The AV portion of the circumflex coronary artery supplies a large marginal branch and has been previously stented. The stents are widely patent.   Right coronary artery (RCA): The right coronary artery is calcified there is moderate calcification in the mid vessel near the acute margin his knee centric 50% area of stenosis, but there is excellent flow through this territory. There is mild luminal irregularity in the distal right coronary. This supplies a posterior descending and posterolateral branches which are small and free of significant disease.  Left ventriculography: Not done  Final  Conclusions:  1.  Severe aortic stenosis by echocardiography. 2. Continued patency of the circumflex stents. 3. Progressive angina pectoris with thyroid replacement therapy. 4.  Scattered non critical CAD  Recommendations:  This is a difficult situation.  His AS is not critical, but may be symptomatic because of his progressive AS.  I have discussed with my partners, and am leaning toward replacement of the valve.  Given his long standing classic syndrome X, which his identical twin, Kevin. Inocente Salles, also has, he may not be rendered asymptomatic with AVR.  Will refer to Kevin. Cyndia Bent.   ASSESSMENT AND PLAN: 1.  Coronary artery disease with exertional angina: The patient reports progressive symptoms as outlined above, CCS class II. He is on high-dose metoprolol. I reviewed his last heart catheterization films from 2012. With progressive symptoms I have recommended right and left heart catheterization with possible PCI. I have reviewed the risks, indications, and alternatives to cardiac catheterization, possible angioplasty, and stenting with the patient. Risks include but are not limited to bleeding, infection, vascular injury, stroke, myocardial infection, arrhythmia, kidney injury, radiation-related injury in the case of prolonged fluoroscopy use, emergency cardiac surgery, and death. The patient understands the risks of serious complication is 1-2 in 4098 with diagnostic cardiac cath and 1-2% or less with angioplasty/stenting.   2. Atrial fibrillation: Tolerating anticoagulation. He will hold this for his cardiac catheterization procedure per protocol. Heart rate is controlled on high-dose beta-blockade. Followed closely by Kevin. Lovena Le. We discussed strategies of rate control versus rhythm control and he seems to be most comfortable with a rate control strategy at present. He has been evaluated at [facility] for ablation and has considered Tikosyn for AAD RX as well.  3. Chronic diastolic CHF: We will assess  intracardiac hemodynamics at the time of heart catheterization. We'll continue his current medical program. In the past he has not had much response to the addition of a loop diuretic.  4. Hypertension with autonomic dysfunction and postural hypotension. Overall appears stable.  Current medicines are reviewed with the patient today.  The patient does not have concerns regarding medicines.  Labs/ tests ordered today include:  No orders of the  defined types were placed in this encounter.   Disposition:   FU pending cath results, likely 6 months  Signed, Sherren Mocha, [name]  07/02/2016 10:48 AM    [facility] [place], [place], [place]  [address] Phone: [phone]; Fax: [phone]   ADDENDUM 07/15/2016: In review of patient's records and discussion with patient at time of cardiac catheterization, I have recommended a sleep study to evaluate him for obstructive sleep apnea. The patient has multiple cardiac arrhythmias, CAD, and symptoms of non-restorative sleep, shortness of breath, fatigue, and frequent nocturia. Will request for  Kevin Claiborne Billings to arrange and interpret sleep study.   Sherren Mocha 07/15/2016 10:58 AM

## 2016-07-05 ENCOUNTER — Other Ambulatory Visit: Payer: Self-pay | Admitting: Cardiovascular Disease

## 2016-07-05 DIAGNOSIS — I25118 Atherosclerotic heart disease of native coronary artery with other forms of angina pectoris: Secondary | ICD-10-CM

## 2016-07-06 ENCOUNTER — Encounter (HOSPITAL_COMMUNITY)
Admission: RE | Disposition: A | Discharge status: Home / Self Care | Payer: Self-pay | Source: Ambulatory Visit | Attending: Cardiovascular Disease

## 2016-07-06 ENCOUNTER — Other Ambulatory Visit: Payer: Self-pay | Admitting: *Deleted

## 2016-07-06 ENCOUNTER — Ambulatory Visit (HOSPITAL_COMMUNITY)
Admission: RE | Admit: 2016-07-06 | Discharge: 2016-07-06 | Disposition: A | Discharge status: Home / Self Care | Payer: Medicare Other | Source: Ambulatory Visit | Attending: Cardiovascular Disease | Admitting: Cardiovascular Disease

## 2016-07-06 DIAGNOSIS — I951 Orthostatic hypotension: Secondary | ICD-10-CM | POA: Insufficient documentation

## 2016-07-06 DIAGNOSIS — G909 Disorder of the autonomic nervous system, unspecified: Secondary | ICD-10-CM | POA: Insufficient documentation

## 2016-07-06 DIAGNOSIS — Z7982 Long term (current) use of aspirin: Secondary | ICD-10-CM | POA: Diagnosis not present

## 2016-07-06 DIAGNOSIS — Z7952 Long term (current) use of systemic steroids: Secondary | ICD-10-CM | POA: Diagnosis not present

## 2016-07-06 DIAGNOSIS — Z950 Presence of cardiac pacemaker: Secondary | ICD-10-CM | POA: Diagnosis not present

## 2016-07-06 DIAGNOSIS — Z7901 Long term (current) use of anticoagulants: Secondary | ICD-10-CM | POA: Insufficient documentation

## 2016-07-06 DIAGNOSIS — I480 Paroxysmal atrial fibrillation: Secondary | ICD-10-CM | POA: Diagnosis not present

## 2016-07-06 DIAGNOSIS — Z8673 Personal history of transient ischemic attack (TIA), and cerebral infarction without residual deficits: Secondary | ICD-10-CM | POA: Diagnosis not present

## 2016-07-06 DIAGNOSIS — I25118 Atherosclerotic heart disease of native coronary artery with other forms of angina pectoris: Secondary | ICD-10-CM | POA: Diagnosis present

## 2016-07-06 DIAGNOSIS — Z8249 Family history of ischemic heart disease and other diseases of the circulatory system: Secondary | ICD-10-CM | POA: Diagnosis not present

## 2016-07-06 DIAGNOSIS — R400 Somnolence: Secondary | ICD-10-CM

## 2016-07-06 DIAGNOSIS — I2584 Coronary atherosclerosis due to calcified coronary lesion: Secondary | ICD-10-CM | POA: Insufficient documentation

## 2016-07-06 DIAGNOSIS — I110 Hypertensive heart disease with heart failure: Secondary | ICD-10-CM | POA: Diagnosis not present

## 2016-07-06 DIAGNOSIS — I4892 Unspecified atrial flutter: Secondary | ICD-10-CM | POA: Insufficient documentation

## 2016-07-06 DIAGNOSIS — N400 Enlarged prostate without lower urinary tract symptoms: Secondary | ICD-10-CM | POA: Diagnosis not present

## 2016-07-06 DIAGNOSIS — E785 Hyperlipidemia, unspecified: Secondary | ICD-10-CM | POA: Insufficient documentation

## 2016-07-06 DIAGNOSIS — M1990 Unspecified osteoarthritis, unspecified site: Secondary | ICD-10-CM | POA: Diagnosis not present

## 2016-07-06 DIAGNOSIS — E039 Hypothyroidism, unspecified: Secondary | ICD-10-CM | POA: Diagnosis not present

## 2016-07-06 DIAGNOSIS — G478 Other sleep disorders: Secondary | ICD-10-CM

## 2016-07-06 DIAGNOSIS — I350 Nonrheumatic aortic (valve) stenosis: Secondary | ICD-10-CM | POA: Insufficient documentation

## 2016-07-06 DIAGNOSIS — D472 Monoclonal gammopathy: Secondary | ICD-10-CM | POA: Diagnosis not present

## 2016-07-06 DIAGNOSIS — I5032 Chronic diastolic (congestive) heart failure: Secondary | ICD-10-CM | POA: Diagnosis not present

## 2016-07-06 DIAGNOSIS — Z953 Presence of xenogenic heart valve: Secondary | ICD-10-CM | POA: Insufficient documentation

## 2016-07-06 DIAGNOSIS — I2510 Atherosclerotic heart disease of native coronary artery without angina pectoris: Secondary | ICD-10-CM

## 2016-07-06 HISTORY — PX: RIGHT/LEFT HEART CATH AND CORONARY ANGIOGRAPHY: CATH118266

## 2016-07-06 LAB — POCT I-STAT 3, VENOUS BLOOD GAS (G3P V)
Bicarbonate: 25.8 mmol/L (ref 20.0–28.0)
O2 Saturation: 67 %
TCO2: 27 mmol/L (ref 0–100)
pCO2, Ven: 45.3 mmHg (ref 44.0–60.0)
pH, Ven: 7.363 (ref 7.250–7.430)
pO2, Ven: 36 mmHg (ref 32.0–45.0)

## 2016-07-06 LAB — POCT I-STAT 3, ART BLOOD GAS (G3+)
Acid-base deficit: 1 mmol/L (ref 0.0–2.0)
Bicarbonate: 24.5 mmol/L (ref 20.0–28.0)
O2 Saturation: 98 %
TCO2: 26 mmol/L (ref 0–100)
pCO2 arterial: 42 mmHg (ref 32.0–48.0)
pH, Arterial: 7.373 (ref 7.350–7.450)
pO2, Arterial: 101 mmHg (ref 83.0–108.0)

## 2016-07-06 SURGERY — RIGHT/LEFT HEART CATH AND CORONARY ANGIOGRAPHY
Anesthesia: LOCAL

## 2016-07-06 MED ORDER — MIDAZOLAM HCL 2 MG/2ML IJ SOLN
INTRAMUSCULAR | Status: AC
  Filled 2016-07-06: qty 2

## 2016-07-06 MED ORDER — HYDRALAZINE HCL 20 MG/ML IJ SOLN: 10.0000 mg | INTRAMUSCULAR | Status: DC | PRN

## 2016-07-06 MED ORDER — SODIUM CHLORIDE 0.9% FLUSH: 3.0000 mL | INTRAVENOUS | Status: DC | PRN

## 2016-07-06 MED ORDER — VERAPAMIL HCL 2.5 MG/ML IV SOLN
INTRAVENOUS | Status: AC
  Filled 2016-07-06: qty 2

## 2016-07-06 MED ORDER — HEPARIN (PORCINE) IN NACL 2-0.9 UNIT/ML-% IJ SOLN
INTRAMUSCULAR | Status: AC
  Filled 2016-07-06: qty 1000

## 2016-07-06 MED ORDER — SODIUM CHLORIDE 0.9 % IV SOLN: 250.0000 mL | INTRAVENOUS | Status: DC | PRN

## 2016-07-06 MED ORDER — VERAPAMIL HCL 2.5 MG/ML IV SOLN
INTRAVENOUS | Status: DC | PRN
  Administered 2016-07-06: 10 mL via INTRA_ARTERIAL

## 2016-07-06 MED ORDER — HEPARIN SODIUM (PORCINE) 1000 UNIT/ML IJ SOLN
INTRAMUSCULAR | Status: DC | PRN
  Administered 2016-07-06: 5000 [IU] via INTRAVENOUS

## 2016-07-06 MED ORDER — FAMOTIDINE IN NACL 20-0.9 MG/50ML-% IV SOLN
INTRAVENOUS | Status: AC
  Administered 2016-07-06: 20 mg via INTRAVENOUS
  Filled 2016-07-06: qty 50

## 2016-07-06 MED ORDER — IOPAMIDOL (ISOVUE-370) INJECTION 76%
INTRAVENOUS | Status: AC
  Filled 2016-07-06: qty 100

## 2016-07-06 MED ORDER — LIDOCAINE HCL (PF) 1 % IJ SOLN
INTRAMUSCULAR | Status: AC
  Filled 2016-07-06: qty 30

## 2016-07-06 MED ORDER — SODIUM CHLORIDE 0.9 % WEIGHT BASED INFUSION
3.0000 mL/kg/h | INTRAVENOUS | Status: AC
  Administered 2016-07-06: 3 mL/kg/h via INTRAVENOUS

## 2016-07-06 MED ORDER — ASPIRIN 81 MG PO CHEW
81.0000 mg | CHEWABLE_TABLET | ORAL | Status: DC
Start: 2016-07-06 — End: 2016-07-06

## 2016-07-06 MED ORDER — SODIUM CHLORIDE 0.9 % WEIGHT BASED INFUSION: 1.0000 mL/kg/h | INTRAVENOUS | Status: DC

## 2016-07-06 MED ORDER — FENTANYL CITRATE (PF) 100 MCG/2ML IJ SOLN
INTRAMUSCULAR | Status: AC
Start: 2016-07-06 — End: ?
  Filled 2016-07-06: qty 2

## 2016-07-06 MED ORDER — ONDANSETRON HCL 4 MG/2ML IJ SOLN
4.0000 mg | Freq: Four times a day (QID) | INTRAMUSCULAR | Status: DC | PRN
Start: 2016-07-06 — End: 2016-07-06

## 2016-07-06 MED ORDER — SODIUM CHLORIDE 0.9% FLUSH: 3.0000 mL | Freq: Two times a day (BID) | INTRAVENOUS | Status: DC

## 2016-07-06 MED ORDER — FENTANYL CITRATE (PF) 100 MCG/2ML IJ SOLN
INTRAMUSCULAR | Status: DC | PRN
  Administered 2016-07-06: 25 ug via INTRAVENOUS

## 2016-07-06 MED ORDER — LABETALOL HCL 5 MG/ML IV SOLN: 20.0000 mg | INTRAVENOUS | Status: DC | PRN

## 2016-07-06 MED ORDER — DIPHENHYDRAMINE HCL 50 MG/ML IJ SOLN
25.0000 mg | Freq: Once | INTRAMUSCULAR | Status: AC
  Administered 2016-07-06: 25 mg via INTRAVENOUS

## 2016-07-06 MED ORDER — LABETALOL HCL 5 MG/ML IV SOLN
INTRAVENOUS | Status: AC
  Filled 2016-07-06: qty 4

## 2016-07-06 MED ORDER — HEPARIN (PORCINE) IN NACL 2-0.9 UNIT/ML-% IJ SOLN
INTRAMUSCULAR | Status: DC | PRN
  Administered 2016-07-06: 1000 mL

## 2016-07-06 MED ORDER — METOPROLOL TARTRATE 5 MG/5ML IV SOLN
INTRAVENOUS | Status: DC | PRN
  Administered 2016-07-06: 5 mg via INTRAVENOUS

## 2016-07-06 MED ORDER — LABETALOL HCL 5 MG/ML IV SOLN
INTRAVENOUS | Status: DC | PRN
  Administered 2016-07-06: 10 mg via INTRAVENOUS

## 2016-07-06 MED ORDER — LIDOCAINE HCL (PF) 1 % IJ SOLN
INTRAMUSCULAR | Status: DC | PRN
  Administered 2016-07-06: 2 mL
  Administered 2016-07-06: 3 mL

## 2016-07-06 MED ORDER — MIDAZOLAM HCL 2 MG/2ML IJ SOLN
INTRAMUSCULAR | Status: DC | PRN
Start: 2016-07-06 — End: 2016-07-06
  Administered 2016-07-06: 2 mg via INTRAVENOUS

## 2016-07-06 MED ORDER — FAMOTIDINE IN NACL 20-0.9 MG/50ML-% IV SOLN
20.0000 mg | Freq: Once | INTRAVENOUS | Status: AC
  Administered 2016-07-06: 20 mg via INTRAVENOUS

## 2016-07-06 MED ORDER — ACETAMINOPHEN 325 MG PO TABS: 650.0000 mg | ORAL_TABLET | ORAL | Status: DC | PRN

## 2016-07-06 MED ORDER — DIPHENHYDRAMINE HCL 50 MG/ML IJ SOLN
INTRAMUSCULAR | Status: AC
  Administered 2016-07-06: 25 mg via INTRAVENOUS
  Filled 2016-07-06: qty 1

## 2016-07-06 MED ORDER — IOPAMIDOL (ISOVUE-370) INJECTION 76%
INTRAVENOUS | Status: DC | PRN
  Administered 2016-07-06: 70 mL via INTRA_ARTERIAL

## 2016-07-06 MED ORDER — METOPROLOL TARTRATE 5 MG/5ML IV SOLN
INTRAVENOUS | Status: AC
  Filled 2016-07-06: qty 5

## 2016-07-06 SURGICAL SUPPLY — 15 items
CATH BALLN WEDGE 5F 110CM (CATHETERS) ×1 IMPLANT
CATH INFINITI 5FR ANG PIGTAIL (CATHETERS) ×1 IMPLANT
CATH INFINITI 5FR JL5 (CATHETERS) ×1 IMPLANT
CATH INFINITI JR4 5F (CATHETERS) ×1 IMPLANT
DEVICE RAD COMP TR BAND LRG (VASCULAR PRODUCTS) ×1 IMPLANT
GLIDESHEATH SLEND SS 6F .021 (SHEATH) ×1 IMPLANT
GUIDEWIRE .025 260CM (WIRE) ×1 IMPLANT
GUIDEWIRE INQWIRE 1.5J.035X260 (WIRE) IMPLANT
INQWIRE 1.5J .035X260CM (WIRE) ×2
KIT HEART LEFT (KITS) ×2 IMPLANT
PACK CARDIAC CATHETERIZATION (CUSTOM PROCEDURE TRAY) ×2 IMPLANT
SHEATH FAST CATH BRACH 5F 5CM (SHEATH) ×1 IMPLANT
SYR MEDRAD MARK V 150ML (SYRINGE) ×2 IMPLANT
TRANSDUCER W/STOPCOCK (MISCELLANEOUS) ×2 IMPLANT
TUBING CIL FLEX 10 FLL-RA (TUBING) ×2 IMPLANT

## 2016-07-06 NOTE — Discharge Instructions (Signed)
Radial Site Care °Refer to this sheet in the next few weeks. These instructions provide you with information about caring for yourself after your procedure. Your health care provider may also give you more specific instructions. Your treatment has been planned according to current medical practices, but problems sometimes occur. Call your health care provider if you have any problems or questions after your procedure. °What can I expect after the procedure? °After your procedure, it is typical to have the following: °· Bruising at the radial site that usually fades within 1-2 weeks. °· Blood collecting in the tissue (hematoma) that may be painful to the touch. It should usually decrease in size and tenderness within 1-2 weeks. °Follow these instructions at home: °· Take medicines only as directed by your health care provider. °· You may shower 24-48 hours after the procedure or as directed by your health care provider. Remove the bandage (dressing) and gently wash the site with plain soap and water. Pat the area dry with a clean towel. Do not rub the site, because this may cause bleeding. °· Do not take baths, swim, or use a hot tub until your health care provider approves. °· Check your insertion site every day for redness, swelling, or drainage. °· Do not apply powder or lotion to the site. °· Do not flex or bend the affected arm for 24 hours or as directed by your health care provider. °· Do not push or pull heavy objects with the affected arm for 24 hours or as directed by your health care provider. °· Do not lift over 10 lb (4.5 kg) for 5 days after your procedure or as directed by your health care provider. °· Ask your health care provider when it is okay to: °¨ Return to work or school. °¨ Resume usual physical activities or sports. °¨ Resume sexual activity. °· Do not drive home if you are discharged the same day as the procedure. Have someone else drive you. °· You may drive 24 hours after the procedure  unless otherwise instructed by your health care provider. °· Do not operate machinery or power tools for 24 hours after the procedure. °· If your procedure was done as an outpatient procedure, which means that you went home the same day as your procedure, a responsible adult should be with you for the first 24 hours after you arrive home. °· Keep all follow-up visits as directed by your health care provider. This is important. °Contact a health care provider if: °· You have a fever. °· You have chills. °· You have increased bleeding from the radial site. Hold pressure on the site. CALL 911 °Get help right away if: °· You have unusual pain at the radial site. °· You have redness, warmth, or swelling at the radial site. °· You have drainage (other than a small amount of blood on the dressing) from the radial site. °· The radial site is bleeding, and the bleeding does not stop after 30 minutes of holding steady pressure on the site. °· Your arm or hand becomes pale, cool, tingly, or numb. °This information is not intended to replace advice given to you by your health care provider. Make sure you discuss any questions you have with your health care provider. °Document Released: 03/20/2010 Document Revised: 07/24/2015 Document Reviewed: 09/03/2013 °Elsevier Interactive Patient Education © 2017 [employer] ° ° °

## 2016-07-06 NOTE — Interval H&P Note (Signed)
Cath Lab Visit (complete for each Cath Lab visit)  Clinical Evaluation Leading to the Procedure:   ACS: No.  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 After consideration of risks, benefits and other options for treatment, the patient has consented to  Procedure(s): Right/Left Heart Cath and Coronary Angiography (N/A) as a surgical intervention .  The patient's history has been reviewed, patient examined, no change in status, stable for surgery.  I have reviewed the patient's chart and labs.  Questions were answered to the patient's satisfaction.     Sherren Mocha

## 2016-07-06 NOTE — Progress Notes (Signed)
Pressure held 10 minutes to right radial/ slight swelling, will continue to monitor.

## 2016-07-06 NOTE — H&P (View-Only) (Signed)
Cardiology Office Note Date:  07/02/2016   ID:  Kevin Buff, [name], DOB 07-03-1934, MRN [id number]  PCP:  Limmie Patricia, [name]  Cardiologist:  Sherren Mocha, [name]    No chief complaint on file.    History of Present Illness: Kevin Buff, [name] is a 82 y.o. male who presents for follow-up evaluation.  [name] returns for follow-up of multiple cardiac issues. He is 82 years old and has been followed for coronary artery disease, cardiac syndrome X., aortic stenosis status post bioprosthetic aortic valve replacement, and atrial fibrillation. He underwent aortic valve replacement with a 25 mm Edwards pericardial bioprosthesis and left-sided radiofrequency cryo- Cox maze procedure with ligation of the left atrial appendage in 2013. He's had a lot of problems with autonomic dysfunction, supine hypertension, and orthostatic hypotension. Over the past 6 months he has been followed closely by Kevin. Lovena Le for his multiple arrhythmias. There was a discussion about dofetilide but the patient preferred to continue with a rate control strategy.  [name] is here alone today. He continues to have worsening shortness of breath. He also has begun experiencing exertional angina. He complains of central and left-sided chest pressure radiating to his left shoulder with walking. Symptoms are worse than they were 6 months ago. He has long-standing microvascular angina, but symptoms are more pronounced. He also complains of worsening exercise intolerance, increased fatigue, and progressive shortness of breath. Symptoms of dizziness and postural hypotension are unchanged. He reports no bleeding problems on chronic anticoagulation. Chest pain symptoms are relieved with rest. He has not required sublingual nitroglycerin.   Past Medical History:  Diagnosis Date  . Aortic stenosis    moderate aortic stenosis  . Arthritis   . Benign prostatic hypertrophy   . Chronotropic incompetence with sinus node dysfunction  (HCC)    Status post Guidant dual-mode, dual-pacing, dual-sensing  pacemaker   implantation now programmed to AAI with recent generator change.  . Coronary artery disease    status post multiple prior percutaneous coronary interventions, microvascular angina per Kevin Olevia Perches  . Heart murmur   . Hypercoagulable state ([place])    chronically anticoagulated with coumadin  . Hyperlipidemia   . Hyperthyroidism   . Hypothyroidism    Kevin. Elyse Hsu  . MGUS (monoclonal gammopathy of unknown significance) 02/17/2013  . Paroxysmal atrial fibrillation ([place])    Kevin. Lia Foyer,   . Stroke [facility])    1990    Past Surgical History:  Procedure Laterality Date  . AORTIC VALVE REPLACEMENT  03/15/2011   Procedure: AORTIC VALVE REPLACEMENT (AVR);  Surgeon: Gaye Pollack, [name];  Location: [place];  Service: Open Heart Surgery;  Laterality: N/A;  . APPENDECTOMY    . CARDIAC CATHETERIZATION     11  . CARDIOVERSION    . CARDIOVERSION  04/15/2011   Procedure: CARDIOVERSION;  Surgeon: Loralie Champagne, [name];  Location: [place];  Service: Cardiovascular;  Laterality: N/A;  . CARDIOVERSION N/A 09/11/2014   Procedure: CARDIOVERSION;  Surgeon: Sueanne Margarita, [name];  Location: [facility] ENDOSCOPY;  Service: Cardiovascular;  Laterality: N/A;  . CARDIOVERSION N/A 06/27/2015   Procedure: CARDIOVERSION;  Surgeon: Thayer Headings, [name];  Location: [facility] ENDOSCOPY;  Service: Cardiovascular;  Laterality: N/A;  . CARDIOVERSION N/A 07/04/2015   Procedure: CARDIOVERSION;  Surgeon: Evans Lance, [name];  Location: [place];  Service: Cardiovascular;  Laterality: N/A;  . EP IMPLANTABLE DEVICE N/A 06/16/2015   Procedure: Pacemaker Implant;  Surgeon: Evans Lance, [name];  Location: [place] CV LAB;  Service:  Cardiovascular;  Laterality: N/A;  . hemrrhoidectomy    . LEFT AND RIGHT HEART CATHETERIZATION WITH CORONARY ANGIOGRAM Bilateral 02/01/2011   Procedure: LEFT AND RIGHT HEART CATHETERIZATION WITH CORONARY ANGIOGRAM;  Surgeon: Hillary Bow, [name];   Location: [facility] CATH LAB;  Service: Cardiovascular;  Laterality: Bilateral;  . MAZE  03/15/2011   Procedure: MAZE;  Surgeon: Gaye Pollack, [name];  Location: [place];  Service: Open Heart Surgery;  Laterality: N/A;  . PACEMAKER INSERTION  1991   Guidant PPM, most recent Generator Change by Kevin Olevia Perches was 08/22/06  . TEE WITHOUT CARDIOVERSION  04/15/2011   Procedure: TRANSESOPHAGEAL ECHOCARDIOGRAM (TEE);  Surgeon: Loralie Champagne, [name];  Location: [place];  Service: Cardiovascular;  Laterality: N/A;  . TEE WITHOUT CARDIOVERSION N/A 09/11/2014   Procedure: TRANSESOPHAGEAL ECHOCARDIOGRAM (TEE);  Surgeon: Sueanne Margarita, [name];  Location: [facility] ENDOSCOPY;  Service: Cardiovascular;  Laterality: N/A;    Current Outpatient Prescriptions  Medication Sig Dispense Refill  . apixaban (ELIQUIS) 5 MG TABS tablet Take 1 tablet (5 mg total) by mouth 2 (two) times daily. 180 tablet 3  . aspirin EC 81 MG tablet Take 81 mg by mouth daily.    . bisacodyl (DULCOLAX) 5 MG EC tablet Take 5 mg by mouth daily as needed (constipation).     . butalbital-acetaminophen-caffeine (FIORICET, ESGIC) 50-325-40 MG per tablet Take 1 tablet by mouth 2 (two) times daily as needed (osteo-arthritis pain).     . Coenzyme Q10 (COQ10) 100 MG CAPS Take 100 mg by mouth daily.    . Diclofenac Sodium 1.5 % SOLN Take 1 application by mouth 2 (two) times daily as needed (Knee pain).     Marland Kitchen doxazosin (CARDURA) 4 MG tablet Take 4 mg by mouth at bedtime.     Marland Kitchen HYDROcodone-acetaminophen (NORCO) 10-325 MG tablet Take 1 tablet by mouth 2 (two) times daily as needed. 100 tablet 0  . LORazepam (ATIVAN) 1 MG tablet Take 1 mg by mouth at bedtime.     . magnesium oxide (MAG-OX) 400 MG tablet Take 400 mg by mouth daily.    . metoprolol succinate (TOPROL-XL) 100 MG 24 hr tablet Take 1 tablet (100 mg total) by mouth 2 (two) times daily. 180 tablet 3  . Multiple Vitamin (MULTIVITAMIN WITH MINERALS) TABS tablet Take 1 tablet by mouth daily.    . nitroGLYCERIN (NITROSTAT)  0.4 MG SL tablet Place 1 tablet (0.4 mg total) under the tongue every 5 (five) minutes as needed for chest pain. 25 tablet 3  . OVER THE COUNTER MEDICATION Apply 1 patch topically daily as needed (neck and shoulder pain). Chinese pain patch    . potassium chloride SA (KLOR-CON M20) 20 MEQ tablet Take 1 tablet (20 mEq total) by mouth daily. 90 tablet 3  . predniSONE (DELTASONE) 5 MG tablet as directed. Also has a 10 mg tablet - take as directed     No current facility-administered medications for this visit.     Allergies:   Contrast media [iodinated diagnostic agents]   Social History:  The patient  reports that he has never smoked. He has never used smokeless tobacco. He reports that he does not drink alcohol or use drugs.   Family History:  The patient's  family history includes Heart disease in his brother.    ROS:  Please see the history of present illness.  All other systems are reviewed and negative.    PHYSICAL EXAM: VS:  BP 120/78   Pulse 79   Ht 6\' 2"  (  1.88 m)   Wt 219 lb 3 oz (99.4 kg)   BMI 28.14 kg/m  , BMI Body mass index is 28.14 kg/m. GEN: Well nourished, well developed, in no acute distress  HEENT: normal  Neck: no JVD, no masses. No carotid bruits Cardiac: RRR with 2/6 SEM at the RUSB               Respiratory:  clear to auscultation bilaterally, normal work of breathing GI: soft, nontender, nondistended, + BS MS: no deformity or atrophy  Ext: no pretibial edema, pedal pulses 2+= bilaterally Skin: warm and dry, no rash Neuro:  Strength and sensation are intact Psych: euthymic mood, full affect  EKG:  EKG is ordered today. The ekg ordered today shows atrial flutter with variable AV block, right bundle branch block, left anterior fascicular block  Recent Labs: 11/20/2015: Hemoglobin 12.1 04/05/2016: ALT 23; BUN 14; Creatinine, Ser 0.85; Platelets 289; Potassium 4.7; Sodium 140   Lipid Panel     Component Value Date/Time   CHOL 259 (H) 09/10/2014 1530    TRIG 113 09/10/2014 1530   HDL 77 09/10/2014 1530   CHOLHDL 3.4 09/10/2014 1530   VLDL 23 09/10/2014 1530   LDLCALC 159 (H) 09/10/2014 1530   LDLDIRECT 117.9 11/16/2010 1355      Wt Readings from Last 3 Encounters:  07/02/16 219 lb 3 oz (99.4 kg)  04/30/16 221 lb (100.2 kg)  04/12/16 220 lb (99.8 kg)     Cardiac Studies Reviewed: Echo 08/11/2015: Study Conclusions  - Left ventricle: The cavity size was mildly dilated. Wall   thickness was normal. Systolic function was normal. The estimated   ejection fraction was in the range of 55% to 60%. Wall motion was   normal; there were no regional wall motion abnormalities. The   study is not technically sufficient to allow evaluation of LV   diastolic function. - Aortic valve: A bioprosthesis was present and functioning   normally. Mean gradient (S): 12 mm Hg. Peak gradient (S): 27 mm   Hg. Valve area (VTI): 1.48 cm^2. Valve area (Vmax): 1.44 cm^2.   Valve area (Vmean): 1.65 cm^2. - Mitral valve: Calcified annulus. There was mild regurgitation   directed centrally. - Left atrium: The atrium was severely dilated. - Right ventricle: The cavity size was mildly dilated. - Right atrium: The atrium was moderately dilated. - Pulmonary arteries: Systolic pressure was mildly increased. PA   peak pressure: 41 mm Hg (S).  Cardiac Cath 02/01/2011: Procedural Details: The right groin was prepped, draped, and anesthetized with 1% lidocaine. Using the modified Seldinger technique a 5 French sheath was placed in the right femoral artery and a 7 French sheath was placed in the right femoral vein. A Swan-Ganz catheter was used for the right heart catheterization. Standard protocol was followed for recording of right heart pressures and sampling of oxygen saturations. Fick cardiac output was calculated. Standard Judkins catheters were used for selective coronary angiography and left ventriculography. There were no immediate procedural complications. The  patient was transferred to the post catheterization recovery area for further monitoring.  Procedural Findings: Hemodynamics RA 7/6/5 RV 26/5 PA 25/13 PCWP 11 LV Not done AO 147/82 (106)  Oxygen saturations: PA 69% AO 96%  Cardiac Output (Fick) 6.6 L/min  Cardiac Index (Fick) 2.85 L/min/m2 Thermodilution  CO:  5.46 L/min Thermodilution Index:  2.37 L/min/M2              Coronary angiography: Coronary dominance: right  Left mainstem: The left  main coronary artery was a large-caliber vessel that was free of significant disease.   LAD:  The left anterior descending artery courses to the cardiac apex there is calcification and mild luminal irregularity throughout the first septal perforator has about 50% narrowing and there is a second septal perforator that has approximately 80-90% narrowing but is a small caliber vessel. The distal left anterior descending artery wraps the apex and provides twin apical vessels. Other than luminal regularity is free of critical disease.  Left Circumflex: The circumflex coronary artery provides a ramus intermedius and a large AV circumflex that supplies a marginal branch. The  ramus intermedius  bifurcates into superior branch has approximately 40-50% narrowing but supplies a moderate size distal vessel the inferior subbranch appears to be widely patent. The AV portion of the circumflex coronary artery supplies a large marginal branch and has been previously stented. The stents are widely patent.   Right coronary artery (RCA): The right coronary artery is calcified there is moderate calcification in the mid vessel near the acute margin his knee centric 50% area of stenosis, but there is excellent flow through this territory. There is mild luminal irregularity in the distal right coronary. This supplies a posterior descending and posterolateral branches which are small and free of significant disease.  Left ventriculography: Not done  Final  Conclusions:  1.  Severe aortic stenosis by echocardiography. 2. Continued patency of the circumflex stents. 3. Progressive angina pectoris with thyroid replacement therapy. 4.  Scattered non critical CAD  Recommendations:  This is a difficult situation.  His AS is not critical, but may be symptomatic because of his progressive AS.  I have discussed with my partners, and am leaning toward replacement of the valve.  Given his long standing classic syndrome X, which his identical twin, Kevin. Inocente Salles, also has, he may not be rendered asymptomatic with AVR.  Will refer to Kevin. Cyndia Bent.   ASSESSMENT AND PLAN: 1.  Coronary artery disease with exertional angina: The patient reports progressive symptoms as outlined above, CCS class II. He is on high-dose metoprolol. I reviewed his last heart catheterization films from 2012. With progressive symptoms I have recommended right and left heart catheterization with possible PCI. I have reviewed the risks, indications, and alternatives to cardiac catheterization, possible angioplasty, and stenting with the patient. Risks include but are not limited to bleeding, infection, vascular injury, stroke, myocardial infection, arrhythmia, kidney injury, radiation-related injury in the case of prolonged fluoroscopy use, emergency cardiac surgery, and death. The patient understands the risks of serious complication is 1-2 in 1610 with diagnostic cardiac cath and 1-2% or less with angioplasty/stenting.   2. Atrial fibrillation: Tolerating anticoagulation. He will hold this for his cardiac catheterization procedure per protocol. Heart rate is controlled on high-dose beta-blockade. Followed closely by Kevin. Lovena Le. We discussed strategies of rate control versus rhythm control and he seems to be most comfortable with a rate control strategy at present. He has been evaluated at [facility] for ablation and has considered Tikosyn for AAD RX as well.  3. Chronic diastolic CHF: We will assess  intracardiac hemodynamics at the time of heart catheterization. We'll continue his current medical program. In the past he has not had much response to the addition of a loop diuretic.  4. Hypertension with autonomic dysfunction and postural hypotension. Overall appears stable.  Current medicines are reviewed with the patient today.  The patient does not have concerns regarding medicines.  Labs/ tests ordered today include:  No orders of the  defined types were placed in this encounter.   Disposition:   FU pending cath results, likely 6 months  Signed, Sherren Mocha, [name]  07/02/2016 10:48 AM    [facility] [address], [place], [place]  [address] Phone: [phone]; Fax: [phone]

## 2016-07-07 ENCOUNTER — Encounter (HOSPITAL_COMMUNITY): Payer: Self-pay | Admitting: Cardiovascular Disease

## 2016-07-07 ENCOUNTER — Other Ambulatory Visit: Payer: Self-pay

## 2016-07-07 DIAGNOSIS — Z952 Presence of prosthetic heart valve: Secondary | ICD-10-CM

## 2016-07-07 DIAGNOSIS — I25119 Atherosclerotic heart disease of native coronary artery with unspecified angina pectoris: Secondary | ICD-10-CM

## 2016-07-09 ENCOUNTER — Telehealth: Payer: Self-pay | Admitting: Cardiovascular Disease

## 2016-07-09 DIAGNOSIS — I208 Other forms of angina pectoris: Secondary | ICD-10-CM

## 2016-07-09 DIAGNOSIS — I2089 Other forms of angina pectoris: Secondary | ICD-10-CM

## 2016-07-09 MED FILL — LORazepam 1 MG TABS: 1 | 30 days supply | Qty: 30 | Fill #0

## 2016-07-09 NOTE — Telephone Encounter (Signed)
I spoke with patient, he is waiting to hear from Cardiac Rehab about enrolling in Cardiac Rehab monitored program.

## 2016-07-09 NOTE — Telephone Encounter (Signed)
I spoke with Carlette at [place] will follow-up with patient this afternoon about getting enrolled in Cardiac Rehab, pt is aware Carlette will be in touch with him later today.

## 2016-07-09 NOTE — Telephone Encounter (Signed)
New message   Pt is having problems trying to get an appointment with the "rehabilitation people". Michela Pitcher he is upset about the phone system and usually does not get a call back. Requests a call back about this as soon as possible.

## 2016-07-11 ENCOUNTER — Encounter (HOSPITAL_COMMUNITY): Payer: Self-pay | Admitting: Cardiovascular Disease

## 2016-07-12 ENCOUNTER — Telehealth (HOSPITAL_COMMUNITY): Payer: Self-pay | Admitting: *Deleted

## 2016-07-12 DIAGNOSIS — Z125 Encounter for screening for malignant neoplasm of prostate: Secondary | ICD-10-CM | POA: Diagnosis not present

## 2016-07-12 DIAGNOSIS — R51 Headache: Secondary | ICD-10-CM | POA: Diagnosis not present

## 2016-07-12 DIAGNOSIS — E039 Hypothyroidism, unspecified: Secondary | ICD-10-CM | POA: Diagnosis not present

## 2016-07-12 NOTE — Telephone Encounter (Signed)
-----   Message from Sherren Mocha, MD sent at 07/11/2016  1:05 PM EDT ----- Regarding: RE: Cardiac rehab Changed cath note to reflect this. thanks ----- Message ----- From: Rowe Pavy, RN Sent: 07/08/2016  11:54 AM To: Sherren Mocha, MD Subject: RE: Cardiac rehab                              Dr. Burt Knack, We can try the diagnosis for chronic stable angina which would hopefully be reimbursed by Medicare.  Would you mind placing a phase II order with that diagnosis and also make an addendum to his cath report indicating "He has a history of microvascular angina. I think he would really benefit from cardiac rehab". Would be helpful for any medicare audits. Thanks Maurice Small RN, BSN Cardiac and Pulmonary Rehab Nurse Navigator    ----- Message ----- From: Sherren Mocha, MD Sent: 07/07/2016  10:09 PM To: Barkley Boards, RN, Rowe Pavy, RN Subject: RE: Cardiac rehab                              He doesn't have tight coronary stenoses but does have typical angina with exertion. He has a history of microvascular angina. I think he would really benefit from cardiac rehab. ----- Message ----- From: Barkley Boards, RN Sent: 07/07/2016   5:08 PM To: Rowe Pavy, RN, Sherren Mocha, MD Subject: RE: Cardiac rehab                              [facility],  I am not sure of the discussion between Dr Burt Knack and the pt (occurred after cardiac cath).  I will have to forward this message to Dr Burt Knack and wait for him to respond to you with this information.    Thanks, Lauren  ----- Message ----- From: Rowe Pavy, RN Sent: 07/07/2016   4:52 PM To: Barkley Boards, RN Subject: Cardiac rehab                                  Ander Purpura,  We received a referral for the above patient.  Olinty contacted pt who was quite upset that he would not be able to attend phase II (telemetry) program and wanted to speak to a nurse. He indicated that was the whole reason to come back  to CR.  I reviewed his cath report which showed non obstructive disease with patent stent which would not substantiate a stable angina order.  I also checked his latest echo for EF (55-60%) this also would not be a medicare reimburseable diagnosis. Before I call him back, I wanted to find out what the conversation entailed and what was hoped to be gain by returning to CR.  My only other option if he absolutely wanted to attend phase II would be to have him come in as self pay.  I appreciate your thoughts and your help Maurice Small RN, BSN Cardiac and Pulmonary Rehab Nurse Navigator

## 2016-07-13 MED FILL — METOPROLOL SUCC ER 100 MG T: 100 | 90 days supply | Qty: 180 | Fill #2

## 2016-07-17 ENCOUNTER — Telehealth: Payer: Self-pay | Admitting: Cardiovascular Disease

## 2016-07-27 ENCOUNTER — Other Ambulatory Visit: Payer: Self-pay | Admitting: Internal Medicine

## 2016-07-27 MED FILL — DOXAZOSIN MESYLATE 4 MG TAB: 4 | 90 days supply | Qty: 90 | Fill #3

## 2016-07-27 MED FILL — BUTALB-ACETAMIN-CAFF 50-325: 50-325-40 | 90 days supply | Qty: 180 | Fill #0

## 2016-07-28 ENCOUNTER — Other Ambulatory Visit: Payer: Self-pay | Admitting: Rheumatology

## 2016-07-28 MED ORDER — HYDROCODONE-ACETAMINOPHEN 10-325 MG PO TABS: 1.0000 | ORAL_TABLET | Freq: Two times a day (BID) | ORAL | 0 refills | Status: DC | PRN

## 2016-07-28 NOTE — Telephone Encounter (Signed)
06/01/16 last visit [facility] in March + Benzos, Barbituates Hyodrocodone  Consistent with meds used  No return visit sch sent message to schedule  Ok to refill Hydrocodone?

## 2016-07-28 NOTE — Telephone Encounter (Signed)
Patient called this morning requesting a refill on his Hydrocodone.  He states he can pick it up tomorrow and will fill it when they go out of town.  CB#[phone]

## 2016-07-28 NOTE — Telephone Encounter (Signed)
ok 

## 2016-08-24 ENCOUNTER — Telehealth (HOSPITAL_COMMUNITY): Payer: Self-pay | Admitting: Pharmacist

## 2016-08-25 NOTE — Telephone Encounter (Signed)
Cardiac Rehab Medication Review by a Pharmacist  Does the patient  feel that his/her medications are working for him/her?  yes  Has the patient been experiencing any side effects to the medications prescribed?  Yes - metoprolol makes him sleepy  Does the patient measure his/her own blood pressure or blood glucose at home?  yes   Does the patient have any problems obtaining medications due to transportation or finances?   no  Understanding of regimen: excellent Understanding of indications: excellent Potential of compliance: excellent    Pharmacist comments: Called patient to do medication hx in anticipation of cardiac rehab tomorrow. Patient has excellent understanding of medications and has no issues at this time. Metoprolol causes sleepiness but this does not bother him per patient. Educated on expiration date for NTG SL tablets, pt expressed understanding.   Carlean Jews, Pharm.D. PGY1 Pharmacy Resident 6/27/20186:42 PM Pager [phone]

## 2016-08-25 NOTE — Addendum Note (Signed)
Addended by: Carlean Jews on: 08/25/2016 06:43 PM   Modules accepted: Orders

## 2016-08-26 ENCOUNTER — Encounter (HOSPITAL_COMMUNITY)
Admission: RE | Admit: 2016-08-26 | Discharge: 2016-08-26 | Disposition: A | Discharge status: Home / Self Care | Payer: Medicare Other | Source: Ambulatory Visit | Attending: Cardiovascular Disease | Admitting: Cardiovascular Disease

## 2016-08-26 ENCOUNTER — Encounter (HOSPITAL_COMMUNITY): Payer: Self-pay

## 2016-08-26 VITALS — BP 142/80 | HR 79 | Ht 73.0 in | Wt 231.3 lb

## 2016-08-26 DIAGNOSIS — I208 Other forms of angina pectoris: Secondary | ICD-10-CM | POA: Diagnosis not present

## 2016-08-26 DIAGNOSIS — I498 Other specified cardiac arrhythmias: Secondary | ICD-10-CM | POA: Diagnosis not present

## 2016-08-26 DIAGNOSIS — M1990 Unspecified osteoarthritis, unspecified site: Secondary | ICD-10-CM | POA: Insufficient documentation

## 2016-08-26 DIAGNOSIS — I350 Nonrheumatic aortic (valve) stenosis: Secondary | ICD-10-CM | POA: Insufficient documentation

## 2016-08-26 DIAGNOSIS — R011 Cardiac murmur, unspecified: Secondary | ICD-10-CM | POA: Insufficient documentation

## 2016-08-26 DIAGNOSIS — I480 Paroxysmal atrial fibrillation: Secondary | ICD-10-CM | POA: Diagnosis not present

## 2016-08-26 DIAGNOSIS — I2089 Other forms of angina pectoris: Secondary | ICD-10-CM

## 2016-08-26 DIAGNOSIS — Z7901 Long term (current) use of anticoagulants: Secondary | ICD-10-CM | POA: Diagnosis not present

## 2016-08-26 DIAGNOSIS — Z79899 Other long term (current) drug therapy: Secondary | ICD-10-CM | POA: Insufficient documentation

## 2016-08-26 DIAGNOSIS — Z8673 Personal history of transient ischemic attack (TIA), and cerebral infarction without residual deficits: Secondary | ICD-10-CM | POA: Insufficient documentation

## 2016-08-26 DIAGNOSIS — N400 Enlarged prostate without lower urinary tract symptoms: Secondary | ICD-10-CM | POA: Insufficient documentation

## 2016-08-26 NOTE — Progress Notes (Signed)
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  apixaban (ELIQUIS) 5 MG TABS tablet, Take 1 tablet (5 mg total) by mouth 2 (two) times daily., Disp: 180 tablet, Rfl: 3 .  bisacodyl (DULCOLAX) 5 MG EC tablet, Take 5 mg by mouth daily as needed (constipation). , Disp: , Rfl:  .  butalbital-acetaminophen-caffeine (FIORICET, ESGIC) 50-325-40 MG per tablet, Take 1 tablet by mouth 2 (two) times daily as needed (osteo-arthritis pain). , Disp: , Rfl:  .  Cholecalciferol (VITAMIN D3) 2000 units capsule, Take 1 capsule by mouth daily., Disp: , Rfl:  .  Coenzyme Q10 (COQ10) 100 MG CAPS, Take 100 mg by mouth daily., Disp: , Rfl:  .  Diclofenac Sodium 1.5 % SOLN, Take 1 application by mouth 2 (two) times daily as needed (Knee pain). , Disp: , Rfl:  .  doxazosin (CARDURA) 4 MG tablet, Take 4 mg by mouth at bedtime. , Disp: , Rfl:  .  HYDROcodone-acetaminophen (NORCO) 10-325 MG tablet, Take 1 tablet by mouth 2 (two) times daily as needed., Disp: 100 tablet, Rfl: 0 .  LORazepam (ATIVAN) 1 MG tablet, Take 1 mg by mouth at bedtime. , Disp: , Rfl:  .  magnesium oxide (MAG-OX) 400 MG tablet, Take 400 mg by mouth daily., Disp: , Rfl:  .  metoprolol succinate (TOPROL-XL) 100 MG 24 hr tablet, Take 1 tablet (100 mg total) by mouth 2 (two) times daily., Disp: 180 tablet, Rfl: 3 .  nitroGLYCERIN (NITROSTAT) 0.4 MG SL tablet, Place 1 tablet (0.4 mg total) under the tongue every 5  (five) minutes as needed for chest pain., Disp: 25 tablet, Rfl: 3 .  OVER THE COUNTER MEDICATION, Apply 1 patch topically daily as needed (neck and shoulder pain). Chinese pain patch, Disp: , Rfl:  .  potassium chloride SA (K-DUR,KLOR-CON) 20 MEQ tablet, TAKE 1 TABLET BY MOUTH ONCE DAILY, Disp: 90 tablet, Rfl: 3 .  predniSONE (DELTASONE) 5 MG tablet, Take 2.5-5 mg by mouth daily as needed. , Disp: , Rfl:  .  rosuvastatin (CRESTOR) 20 MG tablet, Take 1 tablet by mouth at bedtime., Disp: , Rfl:   Past Medical History: Past Medical History:  Diagnosis Date  . Aortic stenosis    moderate aortic stenosis  . Arthritis   . Benign prostatic hypertrophy   . 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  . Coronary artery disease    status post multiple prior percutaneous coronary interventions, microvascular angina per Dr Olevia Perches  . Heart murmur   . Hypercoagulable state ([place])    chronically anticoagulated with coumadin  . Hyperlipidemia   . Hyperthyroidism   . Hypothyroidism    Dr. Elyse Hsu  . MGUS (monoclonal gammopathy of unknown significance) 02/17/2013  . Paroxysmal atrial fibrillation ([place])    DR. Lia Foyer,   . Stroke [facility])  1990    Tobacco Use: History  Smoking Status  . Never Smoker  Smokeless Tobacco  . Never Used    Labs: Recent Review Flowsheet Data    Labs for ITP Cardiac and Pulmonary Rehab Latest Ref Rng & Units 11/26/2013 09/10/2014 07/02/2016 07/06/2016 07/06/2016   Cholestrol 100 - 199 mg/dL 150 259(H) 162 - -   LDLCALC 0 - 99 mg/dL 69 159(H) 67 - -   LDLDIRECT mg/dL - - - - -   HDL >39 mg/dL 59 77 71 - -   Trlycerides 0 - 149 mg/dL 112 113 120 - -   Hemoglobin A1c 4.8 - 5.6 % 5.8(H) - 5.7(H) - -   PHART 7.350 - 7.450 - - - - 7.373   PCO2ART 32.0 - 48.0 mmHg - - - - 42.0   HCO3 20.0 - 28.0 mmol/L - - - 25.8 24.5    TCO2 0 - 100 mmol/L - - - 27 26   ACIDBASEDEF 0.0 - 2.0 mmol/L - - - - 1.0   O2SAT % - - - 67.0 98.0      Capillary Blood Glucose: Lab Results  Component Value Date   GLUCAP 114 (H) 03/21/2011   GLUCAP 135 (H) 03/20/2011   GLUCAP 127 (H) 03/20/2011   GLUCAP 123 (H) 03/20/2011   GLUCAP 146 (H) 03/19/2011     Exercise Target Goals: Date: 08/26/16  Exercise Program Goal: Individual exercise prescription set with THRR, safety & activity barriers. Participant demonstrates ability to understand and report RPE using BORG scale, to self-measure pulse accurately, and to acknowledge the importance of the exercise prescription.  Exercise Prescription Goal: Starting with aerobic activity 30 plus minutes a day, 3 days per week for initial exercise prescription. Provide home exercise prescription and guidelines that participant acknowledges understanding prior to discharge.  Activity Barriers & Risk Stratification:     Activity Barriers & Cardiac Risk Stratification - 08/26/16 1620      Activity Barriers & Cardiac Risk Stratification   Activity Barriers None   Cardiac Risk Stratification High      6 Minute Walk:     6 Minute Walk    Row Name 08/26/16 1544         6 Minute Walk   Phase Initial     Distance 1821 feet     Walk Time 6 minutes     # of Rest Breaks 0     MPH 3.4     METS 3.3     RPE 11     VO2 Peak 11.5     Symptoms No     Resting HR 73 bpm     Resting BP 142/80     Max Ex. HR 96 bpm     Max Ex. BP 148/88     2 Minute Post BP 118/78        Oxygen Initial Assessment:   Oxygen Re-Evaluation:   Oxygen Discharge (Final Oxygen Re-Evaluation):   Initial Exercise Prescription:     Initial Exercise Prescription - 08/26/16 1500      Date of Initial Exercise RX and Referring Provider   Date 08/26/16   Referring Provider Sherren Mocha MD     Treadmill   MPH 2.8   Grade 1   Minutes 10   METs 3     Recumbant Bike   Level 2   Minutes 10    METs 3     NuStep   Level 3   Minutes 10  METs 3     Prescription Details   Frequency (times per week) 3   Duration Progress to 45 minutes of aerobic exercise without signs/symptoms of physical distress     Intensity   THRR 40-80% of Max Heartrate 56-115   Ratings of Perceived Exertion 11-13   Perceived Dyspnea 0-4     Progression   Progression Continue to progress workloads to maintain intensity without signs/symptoms of physical distress.     Resistance Training   Training Prescription Yes   Weight 2   Reps 10-15      Perform Capillary Blood Glucose checks as needed.  Exercise Prescription Changes:   Exercise Comments:   Exercise Goals and Review:      Exercise Goals    Row Name 08/26/16 1413             Exercise Goals   Increase Physical Activity Yes       Intervention Provide advice, education, support and counseling about physical activity/exercise needs.;Develop an individualized exercise prescription for aerobic and resistive training based on initial evaluation findings, risk stratification, comorbidities and participant's personal goals.       Expected Outcomes Achievement of increased cardiorespiratory fitness and enhanced flexibility, muscular endurance and strength shown through measurements of functional capacity and personal statement of participant.       Increase Strength and Stamina Yes       Intervention Provide advice, education, support and counseling about physical activity/exercise needs.;Develop an individualized exercise prescription for aerobic and resistive training based on initial evaluation findings, risk stratification, comorbidities and participant's personal goals.       Expected Outcomes Achievement of increased cardiorespiratory fitness and enhanced flexibility, muscular endurance and strength shown through measurements of functional capacity and personal statement of participant.          Exercise Goals Re-Evaluation  :    Discharge Exercise Prescription (Final Exercise Prescription Changes):   Nutrition:  Target Goals: Understanding of nutrition guidelines, daily intake of sodium 1500mg , cholesterol 200mg , calories 30% from fat and 7% or less from saturated fats, daily to have 5 or more servings of fruits and vegetables.  Biometrics:     Pre Biometrics - 08/26/16 1607      Pre Biometrics   Height 6\' 1"  (1.854 m)   Weight 231 lb 4.2 oz (104.9 kg)   Waist Circumference 43 inches   Hip Circumference 45.5 inches   Waist to Hip Ratio 0.95 %   BMI (Calculated) 30.6   Triceps Skinfold 15 mm   % Body Fat 29.8 %   Grip Strength 43 kg   Flexibility 7 in   Single Leg Stand 18.9 seconds       Nutrition Therapy Plan and Nutrition Goals:     Nutrition Therapy & Goals - 08/26/16 1546      Nutrition Therapy   Diet Therapeutic Lifestyle Changes     Personal Nutrition Goals   Nutrition Goal Wt loss of 1-2 lb/week to a wt loss goal of 6-24 lb at graduation from [place].      Intervention Plan   Intervention Prescribe, educate and counsel regarding individualized specific dietary modifications aiming towards targeted core components such as weight, hypertension, lipid management, diabetes, heart failure and other comorbidities.   Expected Outcomes Short Term Goal: Understand basic principles of dietary content, such as calories, fat, sodium, cholesterol and nutrients.;Long Term Goal: Adherence to prescribed nutrition plan.      Nutrition Discharge: Nutrition Scores:     Nutrition Assessments -  08/26/16 1545      MEDFICTS Scores   Pre Score 3      Nutrition Goals Re-Evaluation:   Nutrition Goals Re-Evaluation:   Nutrition Goals Discharge (Final Nutrition Goals Re-Evaluation):   Psychosocial: Target Goals: Acknowledge presence or absence of significant depression and/or stress, maximize coping skills, provide positive support system. Participant is able to verbalize types and  ability to use techniques and skills needed for reducing stress and depression.  Initial Review & Psychosocial Screening:     Initial Psych Review & Screening - 08/26/16 1612      Family Dynamics   Good Support System? Yes     Barriers   Psychosocial barriers to participate in program There are no identifiable barriers or psychosocial needs.     Screening Interventions   Interventions Encouraged to exercise      Quality of Life Scores:     Quality of Life - 08/26/16 1548      Quality of Life Scores   Health/Function Pre 18.57 %   Socioeconomic Pre 28.75 %   Psych/Spiritual Pre 25 %   Family Pre 22.8 %   GLOBAL Pre 22.55 %      PHQ-9: Recent Review Flowsheet Data    Depression screen [facility] 2/9 04/12/2016 04/15/2015 11/26/2013   Decreased Interest 0 0 0   Down, Depressed, Hopeless 0 0 0   PHQ - 2 Score 0 0 0     Interpretation of Total Score  Total Score Depression Severity:  1-4 = Minimal depression, 5-9 = Mild depression, 10-14 = Moderate depression, 15-19 = Moderately severe depression, 20-27 = Severe depression   Psychosocial Evaluation and Intervention:   Psychosocial Re-Evaluation:   Psychosocial Discharge (Final Psychosocial Re-Evaluation):   Vocational Rehabilitation: Provide vocational rehab assistance to qualifying candidates.   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 Participant will state understanding/return demonstration of topics presented.  Learning Barriers/Preferences:     Learning Barriers/Preferences - 08/26/16 1406      Learning Barriers/Preferences   Learning Barriers None   Learning Preferences Verbal Instruction;Written  Material;Skilled Demonstration      Education Topics: Count Your Pulse:  -Group instruction provided by verbal instruction, demonstration, patient participation and written materials to support subject.  Instructors address importance of being able to find your pulse and how to count your pulse when at home without a heart monitor.  Patients get hands on experience counting their pulse with staff help and individually.   Heart Attack, Angina, and Risk Factor Modification:  -Group instruction provided by verbal instruction, video, and written materials to support subject.  Instructors address signs and symptoms of angina and heart attacks.    Also discuss risk factors for heart disease and how to make changes to improve heart health risk factors.   Functional Fitness:  -Group instruction provided by verbal instruction, demonstration, patient participation, and written materials to support subject.  Instructors address safety measures for doing things around the house.  Discuss how to get up and down off the floor, how to pick things up properly, how to safely get out of a chair without assistance, and balance training.   Meditation and Mindfulness:  -Group instruction provided by  verbal instruction, patient participation, and written materials to support subject.  Instructor addresses importance of mindfulness and meditation practice to help reduce stress and improve awareness.  Instructor also leads participants through a meditation exercise.    Stretching for Flexibility and Mobility:  -Group instruction provided by verbal instruction, patient participation, and written materials to support subject.  Instructors lead participants through series of stretches that are designed to increase flexibility thus improving mobility.  These stretches are additional exercise for major muscle groups that are typically performed during regular warm up and cool down.   Hands Only CPR:  -Group verbal,  video, and participation provides a basic overview of AHA guidelines for community CPR. Role-play of emergencies allow participants the opportunity to practice calling for help and chest compression technique with discussion of AED use.   Hypertension: -Group verbal and written instruction that provides a basic overview of hypertension including the most recent diagnostic guidelines, risk factor reduction with self-care instructions and medication management.    Nutrition I class: Heart Healthy Eating:  -Group instruction provided by PowerPoint slides, verbal discussion, and written materials to support subject matter. The instructor gives an explanation and review of the Therapeutic Lifestyle Changes diet recommendations, which includes a discussion on lipid goals, dietary fat, sodium, fiber, plant stanol/sterol esters, sugar, and the components of a well-balanced, healthy diet.   Nutrition II class: Lifestyle Skills:  -Group instruction provided by PowerPoint slides, verbal discussion, and written materials to support subject matter. The instructor gives an explanation and review of label reading, grocery shopping for heart health, heart healthy recipe modifications, and ways to make healthier choices when eating out.   Diabetes Question & Answer:  -Group instruction provided by PowerPoint slides, verbal discussion, and written materials to support subject matter. The instructor gives an explanation and review of diabetes co-morbidities, pre- and post-prandial blood glucose goals, pre-exercise blood glucose goals, signs, symptoms, and treatment of hypoglycemia and hyperglycemia, and foot care basics.   Diabetes Blitz:  -Group instruction provided by PowerPoint slides, verbal discussion, and written materials to support subject matter. The instructor gives an explanation and review of the physiology behind type 1 and type 2 diabetes, diabetes medications and rational behind using different  medications, pre- and post-prandial blood glucose recommendations and Hemoglobin A1c goals, diabetes diet, and exercise including blood glucose guidelines for exercising safely.    Portion Distortion:  -Group instruction provided by PowerPoint slides, verbal discussion, written materials, and food models to support subject matter. The instructor gives an explanation of serving size versus portion size, changes in portions sizes over the last 20 years, and what consists of a serving from each food group.   Stress Management:  -Group instruction provided by verbal instruction, video, and written materials to support subject matter.  Instructors review role of stress in heart disease and how to cope with stress positively.     Exercising on Your Own:  -Group instruction provided by verbal instruction, power point, and written materials to support subject.  Instructors discuss benefits of exercise, components of exercise, frequency and intensity of exercise, and end points for exercise.  Also discuss use of nitroglycerin and activating EMS.  Review options of places to exercise outside of rehab.  Review guidelines for sex with heart disease.   Cardiac Drugs I:  -Group instruction provided by verbal instruction and written materials to support subject.  Instructor reviews cardiac drug classes: antiplatelets, anticoagulants, beta blockers, and statins.  Instructor discusses reasons, side effects, and lifestyle considerations for  each drug class.   Cardiac Drugs II:  -Group instruction provided by verbal instruction and written materials to support subject.  Instructor reviews cardiac drug classes: angiotensin converting enzyme inhibitors (ACE-I), angiotensin II receptor blockers (ARBs), nitrates, and calcium channel blockers.  Instructor discusses reasons, side effects, and lifestyle considerations for each drug class.   Anatomy and Physiology of the Circulatory System:  Group verbal and written  instruction and models provide basic cardiac anatomy and physiology, with the coronary electrical and arterial systems. Review of: AMI, Angina, Valve disease, Heart Failure, Peripheral Artery Disease, Cardiac Arrhythmia, Pacemakers, and the ICD.   Other Education:  -Group or individual verbal, written, or video instructions that support the educational goals of the cardiac rehab program.   Knowledge Questionnaire Score:     Knowledge Questionnaire Score - 08/26/16 1543      Knowledge Questionnaire Score   Pre Score 22/24      Core Components/Risk Factors/Patient Goals at Admission:     Personal Goals and Risk Factors at Admission - 08/26/16 1419      Core Components/Risk Factors/Patient Goals on Admission    Weight Management Yes;Obesity;Weight Maintenance;Weight Loss   Intervention Weight Management: Develop a combined nutrition and exercise program designed to reach desired caloric intake, while maintaining appropriate intake of nutrient and fiber, sodium and fats, and appropriate energy expenditure required for the weight goal.;Weight Management: Provide education and appropriate resources to help participant work on and attain dietary goals.;Weight Management/Obesity: Establish reasonable short term and long term weight goals.;Obesity: Provide education and appropriate resources to help participant work on and attain dietary goals.   Expected Outcomes Short Term: Continue to assess and modify interventions until short term weight is achieved;Long Term: Adherence to nutrition and physical activity/exercise program aimed toward attainment of established weight goal;Weight Maintenance: Understanding of the daily nutrition guidelines, which includes 25-35% calories from fat, 7% or less cal from saturated fats, less than 200mg  cholesterol, less than 1.5gm of sodium, & 5 or more servings of fruits and vegetables daily;Weight Loss: Understanding of general recommendations for a balanced deficit  meal plan, which promotes 1-2 lb weight loss per week and includes a negative energy balance of [phone] kcal/d;Understanding recommendations for meals to include 15-35% energy as protein, 25-35% energy from fat, 35-60% energy from carbohydrates, less than 200mg  of dietary cholesterol, 20-35 gm of total fiber daily;Understanding of distribution of calorie intake throughout the day with the consumption of 4-5 meals/snacks   Hypertension Yes   Intervention Provide education on lifestyle modifcations including regular physical activity/exercise, weight management, moderate sodium restriction and increased consumption of fresh fruit, vegetables, and low fat dairy, alcohol moderation, and smoking cessation.;Monitor prescription use compliance.   Expected Outcomes Short Term: Continued assessment and intervention until BP is < 140/11mm HG in hypertensive participants. < 130/74mm HG in hypertensive participants with diabetes, heart failure or chronic kidney disease.;Long Term: Maintenance of blood pressure at goal levels.   Lipids Yes   Intervention Provide education and support for participant on nutrition & aerobic/resistive exercise along with prescribed medications to achieve LDL 70mg , HDL >40mg .   Expected Outcomes Short Term: Participant states understanding of desired cholesterol values and is compliant with medications prescribed. Participant is following exercise prescription and nutrition guidelines.;Long Term: Cholesterol controlled with medications as prescribed, with individualized exercise RX and with personalized nutrition plan. Value goals: LDL < 70mg , HDL > 40 mg.      Core Components/Risk Factors/Patient Goals Review:    Core Components/Risk Factors/Patient Goals at Discharge (Final  Review):    ITP Comments:     ITP Comments    Row Name 08/26/16 1407           ITP Comments Dr. Fransico Him, Medical Director          Comments: Dr Lorin Picket attended orientation from 1330 to 1500 to  review rules and guidelines for program. Completed 6 minute walk test, Intitial ITP, and exercise prescription.  VSS. Telemetry-chronic atrial flutter with a bundle branch block, V paced on demand, this has been previously documented .  Asymptomatic.Barnet Pall, RN,BSN 08/26/2016 4:22 PM

## 2016-08-26 NOTE — Progress Notes (Signed)
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 Stable angina [facility])    Past Medical History:  Diagnosis Date  . Aortic stenosis    moderate aortic stenosis  . Arthritis   . Benign prostatic hypertrophy   . 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  . Coronary artery disease    status post multiple prior percutaneous coronary interventions, microvascular angina per Dr Olevia Perches  . Heart murmur   . Hypercoagulable state ([place])    chronically anticoagulated with coumadin  . Hyperlipidemia   . Hyperthyroidism   . Hypothyroidism    Dr. Elyse Hsu  . MGUS (monoclonal gammopathy of unknown significance) 02/17/2013  . Paroxysmal atrial fibrillation ([place])    DR. Lia Foyer,   . Stroke [facility])    1990   Meds reviewed.  HT: Ht Readings from Last 1 Encounters:  08/26/16 6\' 1"  (1.854 m)    WT: Wt Readings from Last 3 Encounters:  08/26/16 231 lb 4.2 oz (104.9 kg)  07/06/16 220 lb (99.8 kg)  07/02/16 219 lb 3 oz (99.4 kg)     BMI 30.6   Current tobacco use? No  Labs:  Lipid Panel     Component Value Date/Time   CHOL 162 07/02/2016 1126   TRIG 120 07/02/2016 1126   HDL 71 07/02/2016 1126   CHOLHDL 2.3 07/02/2016 1126   CHOLHDL 3.4 09/10/2014 1530   VLDL 23 09/10/2014 1530   LDLCALC 67 07/02/2016 1126   LDLDIRECT 117.9 11/16/2010 1355    Lab Results  Component Value Date   HGBA1C 5.7 (H) 07/02/2016   CBG (last 3)  No results for input(s): GLUCAP in the last 72 hours.  Nutrition Note Spoke with pt. Pt known to this Probation officer from previous admission. Pt is following Step 2 of the Therapeutic Lifestyle Changes diet. Pt wants to lose wt but has not been actively trying to lose wt and does not plan on trying to lose wt. Pt expressed understanding of the information reviewed. Pt aware of nutrition education  classes offered and declined to attend nutrition classes due to previously receiving class information.  Nutrition Diagnosis ? Food-and nutrition-related knowledge deficit related to lack of exposure to information as related to diagnosis of: ? CVD ? Pre-DM ? Obesity related to excessive energy intake as evidenced by a BMI of 30.6  Nutrition Intervention ? Pt's individual nutrition plan reviewed with pt.  Nutrition Goal(s):  Pt to identify food quantities necessary to achieve weight loss of 6-24 lb (2.7-10.9 kg) at graduation from cardiac rehab.  Plan:  Will provide client-centered nutrition education as part of interdisciplinary care.   Monitor and evaluate progress toward nutrition goal with team.  Derek Mound, M.Ed, RD, LDN, CDE 08/26/2016 3:38 PM

## 2016-08-30 ENCOUNTER — Encounter (HOSPITAL_COMMUNITY): Payer: Medicare Other

## 2016-09-03 ENCOUNTER — Encounter (HOSPITAL_COMMUNITY)
Admission: RE | Admit: 2016-09-03 | Discharge: 2016-09-03 | Disposition: A | Discharge status: Home / Self Care | Payer: Medicare Other | Source: Ambulatory Visit | Attending: Cardiovascular Disease | Admitting: Cardiovascular Disease

## 2016-09-03 DIAGNOSIS — M1990 Unspecified osteoarthritis, unspecified site: Secondary | ICD-10-CM | POA: Diagnosis not present

## 2016-09-03 DIAGNOSIS — N400 Enlarged prostate without lower urinary tract symptoms: Secondary | ICD-10-CM | POA: Insufficient documentation

## 2016-09-03 DIAGNOSIS — R011 Cardiac murmur, unspecified: Secondary | ICD-10-CM | POA: Diagnosis not present

## 2016-09-03 DIAGNOSIS — Z7901 Long term (current) use of anticoagulants: Secondary | ICD-10-CM | POA: Diagnosis not present

## 2016-09-03 DIAGNOSIS — I480 Paroxysmal atrial fibrillation: Secondary | ICD-10-CM | POA: Insufficient documentation

## 2016-09-03 DIAGNOSIS — C4442 Squamous cell carcinoma of skin of scalp and neck: Secondary | ICD-10-CM | POA: Diagnosis not present

## 2016-09-03 DIAGNOSIS — Z79899 Other long term (current) drug therapy: Secondary | ICD-10-CM | POA: Diagnosis not present

## 2016-09-03 DIAGNOSIS — I350 Nonrheumatic aortic (valve) stenosis: Secondary | ICD-10-CM | POA: Insufficient documentation

## 2016-09-03 DIAGNOSIS — Z8673 Personal history of transient ischemic attack (TIA), and cerebral infarction without residual deficits: Secondary | ICD-10-CM | POA: Diagnosis not present

## 2016-09-03 DIAGNOSIS — I208 Other forms of angina pectoris: Secondary | ICD-10-CM | POA: Insufficient documentation

## 2016-09-03 DIAGNOSIS — Z85828 Personal history of other malignant neoplasm of skin: Secondary | ICD-10-CM | POA: Diagnosis not present

## 2016-09-03 DIAGNOSIS — I498 Other specified cardiac arrhythmias: Secondary | ICD-10-CM | POA: Insufficient documentation

## 2016-09-03 DIAGNOSIS — D485 Neoplasm of uncertain behavior of skin: Secondary | ICD-10-CM | POA: Diagnosis not present

## 2016-09-03 NOTE — Progress Notes (Signed)
Daily Session Note  Patient Details  Name: Kevin TUCH, [name] MRN: [id number] Date of Birth: January 17, 1935 Referring Provider:     [place] from 08/26/2016 in [place]  Referring Provider  Sherren Mocha [name]      Encounter Date: 09/03/2016  Check In:   Capillary Blood Glucose: No results found for this or any previous visit (from the past 24 hour(s)).    History  Smoking Status  . Never Smoker  Smokeless Tobacco  . Never Used    Goals Met:  Exercise tolerated well Personal goals reviewed No report of cardiac concerns or symptoms  Goals Unmet:  Not Applicable  Comments:  Pt started full exercise at phase II cardiac rehab today.  Pt tolerated light exercise without difficulty. VSS, telemetry-SR with no noted ectopy, asymptomatic.Pt returns to cardiac rehab with the diagnosis of stable angina  Medication list reconciled. Pt denies barriers to medication compliance.  PSYCHOSOCIAL ASSESSMENT:  PHQ-0. Pt completed pre assessment quality of life survey. Pt scored the following     Quality of Life - 08/26/16 1548      Quality of Life Scores   Health/Function Pre 18.57 %   Socioeconomic Pre 28.75 %   Psych/Spiritual Pre 25 %   Family Pre 22.8 %   GLOBAL Pre 22.55 %       Pt exhibits positive coping skills, hopeful outlook with supportive family. No psychosocial needs identified at this time, no psychosocial interventions necessary.  Pt brother is also a participant in the cardiac rehab maintenance program.  Pt and his twin brother just celebrated a birthday on the 63 of July.   Pt enjoys reading and travel. Pt desires to live comfortable and develop a structured exercise routine and better understanding of limitations with exercise.Pt oriented to exercise equipment and routine.  Understanding verbalized. Maurice Small RN, BSN Cardiac and Pulmonary Rehab Nurse Navigator      Dr. Fransico Him is Medical  Director for Cardiac Rehab at [facility].

## 2016-09-06 ENCOUNTER — Encounter (HOSPITAL_COMMUNITY)
Admission: RE | Admit: 2016-09-06 | Discharge: 2016-09-06 | Disposition: A | Discharge status: Home / Self Care | Payer: Medicare Other | Source: Ambulatory Visit | Attending: Cardiovascular Disease | Admitting: Cardiovascular Disease

## 2016-09-06 DIAGNOSIS — I208 Other forms of angina pectoris: Secondary | ICD-10-CM | POA: Diagnosis not present

## 2016-09-06 DIAGNOSIS — I2089 Other forms of angina pectoris: Secondary | ICD-10-CM

## 2016-09-06 DIAGNOSIS — M1990 Unspecified osteoarthritis, unspecified site: Secondary | ICD-10-CM | POA: Diagnosis not present

## 2016-09-06 DIAGNOSIS — Z7901 Long term (current) use of anticoagulants: Secondary | ICD-10-CM | POA: Diagnosis not present

## 2016-09-06 DIAGNOSIS — N400 Enlarged prostate without lower urinary tract symptoms: Secondary | ICD-10-CM | POA: Diagnosis not present

## 2016-09-06 DIAGNOSIS — Z79899 Other long term (current) drug therapy: Secondary | ICD-10-CM | POA: Diagnosis not present

## 2016-09-06 DIAGNOSIS — I350 Nonrheumatic aortic (valve) stenosis: Secondary | ICD-10-CM | POA: Diagnosis not present

## 2016-09-08 ENCOUNTER — Encounter (HOSPITAL_COMMUNITY)
Admission: RE | Admit: 2016-09-08 | Discharge: 2016-09-08 | Disposition: A | Discharge status: Home / Self Care | Payer: Medicare Other | Source: Ambulatory Visit | Attending: Cardiovascular Disease | Admitting: Cardiovascular Disease

## 2016-09-08 DIAGNOSIS — N400 Enlarged prostate without lower urinary tract symptoms: Secondary | ICD-10-CM | POA: Diagnosis not present

## 2016-09-08 DIAGNOSIS — I350 Nonrheumatic aortic (valve) stenosis: Secondary | ICD-10-CM | POA: Diagnosis not present

## 2016-09-08 DIAGNOSIS — I208 Other forms of angina pectoris: Secondary | ICD-10-CM | POA: Diagnosis not present

## 2016-09-08 DIAGNOSIS — Z7901 Long term (current) use of anticoagulants: Secondary | ICD-10-CM | POA: Diagnosis not present

## 2016-09-08 DIAGNOSIS — M1990 Unspecified osteoarthritis, unspecified site: Secondary | ICD-10-CM | POA: Diagnosis not present

## 2016-09-08 DIAGNOSIS — Z79899 Other long term (current) drug therapy: Secondary | ICD-10-CM | POA: Diagnosis not present

## 2016-09-10 ENCOUNTER — Encounter (HOSPITAL_COMMUNITY)
Admission: RE | Admit: 2016-09-10 | Discharge: 2016-09-10 | Disposition: A | Discharge status: Home / Self Care | Payer: Medicare Other | Source: Ambulatory Visit | Attending: Cardiovascular Disease | Admitting: Cardiovascular Disease

## 2016-09-10 DIAGNOSIS — Z79899 Other long term (current) drug therapy: Secondary | ICD-10-CM | POA: Diagnosis not present

## 2016-09-10 DIAGNOSIS — I350 Nonrheumatic aortic (valve) stenosis: Secondary | ICD-10-CM | POA: Diagnosis not present

## 2016-09-10 DIAGNOSIS — N400 Enlarged prostate without lower urinary tract symptoms: Secondary | ICD-10-CM | POA: Diagnosis not present

## 2016-09-10 DIAGNOSIS — M1990 Unspecified osteoarthritis, unspecified site: Secondary | ICD-10-CM | POA: Diagnosis not present

## 2016-09-10 DIAGNOSIS — I208 Other forms of angina pectoris: Secondary | ICD-10-CM | POA: Diagnosis not present

## 2016-09-10 DIAGNOSIS — Z7901 Long term (current) use of anticoagulants: Secondary | ICD-10-CM | POA: Diagnosis not present

## 2016-09-12 NOTE — Progress Notes (Signed)
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  apixaban (ELIQUIS) 5 MG TABS tablet, Take 1 tablet (5 mg total) by mouth 2 (two) times daily., Disp: 180 tablet, Rfl: 3 .  bisacodyl (DULCOLAX) 5 MG EC tablet, Take 5 mg by mouth daily as needed (constipation). , Disp: , Rfl:  .  butalbital-acetaminophen-caffeine (FIORICET, ESGIC) 50-325-40 MG per tablet, Take 1 tablet by mouth 2 (two) times daily as needed (osteo-arthritis pain). , Disp: , Rfl:  .  Cholecalciferol (VITAMIN D3) 2000 units capsule, Take 1 capsule by mouth daily., Disp: , Rfl:  .  Coenzyme Q10 (COQ10) 100 MG CAPS, Take 100 mg by mouth daily., Disp: , Rfl:  .  Diclofenac Sodium 1.5 % SOLN, Take 1 application by mouth 2 (two) times daily as needed (Knee pain). , Disp: , Rfl:  .  doxazosin (CARDURA) 4 MG tablet, Take 4 mg by mouth at bedtime. , Disp: , Rfl:  .  HYDROcodone-acetaminophen (NORCO) 10-325 MG tablet, Take 1 tablet by mouth 2 (two) times daily as needed., Disp: 100 tablet, Rfl: 0 .  LORazepam (ATIVAN) 1 MG tablet, Take 1 mg by mouth at bedtime. , Disp: , Rfl:  .  magnesium oxide (MAG-OX) 400 MG tablet, Take 400 mg by mouth daily., Disp: , Rfl:  .  metoprolol succinate (TOPROL-XL) 100 MG 24 hr tablet, Take 1 tablet (100 mg total) by mouth 2 (two) times daily., Disp: 180 tablet, Rfl: 3 .  nitroGLYCERIN (NITROSTAT) 0.4 MG SL tablet, Place 1 tablet (0.4 mg total) under the tongue every 5  (five) minutes as needed for chest pain., Disp: 25 tablet, Rfl: 3 .  OVER THE COUNTER MEDICATION, Apply 1 patch topically daily as needed (neck and shoulder pain). Chinese pain patch, Disp: , Rfl:  .  potassium chloride SA (K-DUR,KLOR-CON) 20 MEQ tablet, TAKE 1 TABLET BY MOUTH ONCE DAILY, Disp: 90 tablet, Rfl: 3 .  predniSONE (DELTASONE) 5 MG tablet, Take 2.5-5 mg by mouth daily as needed. , Disp: , Rfl:  .  rosuvastatin (CRESTOR) 20 MG tablet, Take 1 tablet by mouth at bedtime., Disp: , Rfl:   Past Medical History: Past Medical History:  Diagnosis Date  . Aortic stenosis    moderate aortic stenosis  . Arthritis   . Benign prostatic hypertrophy   . 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  . Coronary artery disease    status post multiple prior percutaneous coronary interventions, microvascular angina per Dr Olevia Perches  . Heart murmur   . Hypercoagulable state ([place])    chronically anticoagulated with coumadin  . Hyperlipidemia   . Hyperthyroidism   . Hypothyroidism    Dr. Elyse Hsu  . MGUS (monoclonal gammopathy of unknown significance) 02/17/2013  . Paroxysmal atrial fibrillation ([place])    DR. Lia Foyer,   . Stroke [facility])  1990    Tobacco Use: History  Smoking Status  . Never Smoker  Smokeless Tobacco  . Never Used    Labs: Recent Review Flowsheet Data    Labs for ITP Cardiac and Pulmonary Rehab Latest Ref Rng & Units 11/26/2013 09/10/2014 07/02/2016 07/06/2016 07/06/2016   Cholestrol 100 - 199 mg/dL 150 259(H) 162 - -   LDLCALC 0 - 99 mg/dL 69 159(H) 67 - -   LDLDIRECT mg/dL - - - - -   HDL >39 mg/dL 59 77 71 - -   Trlycerides 0 - 149 mg/dL 112 113 120 - -   Hemoglobin A1c 4.8 - 5.6 % 5.8(H) - 5.7(H) - -   PHART 7.350 - 7.450 - - - - 7.373   PCO2ART 32.0 - 48.0 mmHg - - - - 42.0   HCO3 20.0 - 28.0 mmol/L - - - 25.8 24.5    TCO2 0 - 100 mmol/L - - - 27 26   ACIDBASEDEF 0.0 - 2.0 mmol/L - - - - 1.0   O2SAT % - - - 67.0 98.0      Capillary Blood Glucose: Lab Results  Component Value Date   GLUCAP 114 (H) 03/21/2011   GLUCAP 135 (H) 03/20/2011   GLUCAP 127 (H) 03/20/2011   GLUCAP 123 (H) 03/20/2011   GLUCAP 146 (H) 03/19/2011     Exercise Target Goals:    Exercise Program Goal: Individual exercise prescription set with THRR, safety & activity barriers. Participant demonstrates ability to understand and report RPE using BORG scale, to self-measure pulse accurately, and to acknowledge the importance of the exercise prescription.  Exercise Prescription Goal: Starting with aerobic activity 30 plus minutes a day, 3 days per week for initial exercise prescription. Provide home exercise prescription and guidelines that participant acknowledges understanding prior to discharge.  Activity Barriers & Risk Stratification:     Activity Barriers & Cardiac Risk Stratification - 08/26/16 1620      Activity Barriers & Cardiac Risk Stratification   Activity Barriers None   Cardiac Risk Stratification High      6 Minute Walk:     6 Minute Walk    Row Name 08/26/16 1544         6 Minute Walk   Phase Initial     Distance 1821 feet     Walk Time 6 minutes     # of Rest Breaks 0     MPH 3.4     METS 3.3     RPE 11     VO2 Peak 11.5     Symptoms No     Resting HR 73 bpm     Resting BP 142/80     Max Ex. HR 96 bpm     Max Ex. BP 148/88     2 Minute Post BP 118/78        Oxygen Initial Assessment:   Oxygen Re-Evaluation:   Oxygen Discharge (Final Oxygen Re-Evaluation):   Initial Exercise Prescription:     Initial Exercise Prescription - 08/26/16 1500      Date of Initial Exercise RX and Referring Provider   Date 08/26/16   Referring Provider Sherren Mocha MD     Treadmill   MPH 2.8   Grade 1   Minutes 10   METs 3     Recumbant Bike   Level 2   Minutes 10   METs 3      NuStep   Level 3   Minutes 10  METs 3     Prescription Details   Frequency (times per week) 3   Duration Progress to 45 minutes of aerobic exercise without signs/symptoms of physical distress     Intensity   THRR 40-80% of Max Heartrate 56-115   Ratings of Perceived Exertion 11-13   Perceived Dyspnea 0-4     Progression   Progression Continue to progress workloads to maintain intensity without signs/symptoms of physical distress.     Resistance Training   Training Prescription Yes   Weight 2   Reps 10-15      Perform Capillary Blood Glucose checks as needed.  Exercise Prescription Changes:      Exercise Prescription Changes    Row Name 09/03/16 1041 09/13/16 1000           Response to Exercise   Blood Pressure (Admit) 112/82 98/62      Blood Pressure (Exercise) 130/70 119/78      Blood Pressure (Exit) 112/62 108/60      Heart Rate (Admit) 85 bpm 75 bpm      Heart Rate (Exercise) 105 bpm 102 bpm      Heart Rate (Exit) 85 bpm 75 bpm      Rating of Perceived Exertion (Exercise) 12 11      Symptoms none none       Comments pt was oriented to exercise equipment on 09/03/16  -      Duration Continue with 30 min of aerobic exercise without signs/symptoms of physical distress. Continue with 30 min of aerobic exercise without signs/symptoms of physical distress.      Intensity THRR unchanged THRR unchanged        Progression   Progression Continue to progress workloads to maintain intensity without signs/symptoms of physical distress. Continue to progress workloads to maintain intensity without signs/symptoms of physical distress.      Average METs 3.4 2.9        Resistance Training   Training Prescription Yes No      Weight 2lbs  -      Reps 10-15  -      Time 10 Minutes  -        Treadmill   MPH 2.8 2.8      Grade 1 1      Minutes 10 10      METs 3.53 3.53        Recumbant Bike   Level 2.5 2.5      Minutes 10 10        NuStep   Level 3 4      Minutes 10  10      METs 3.4 2.4         Exercise Comments:      Exercise Comments    Row Name 09/13/16 1044           Exercise Comments Pt was oriented to exercise equipment on 09/03/16. Pt is tolerating exercise fairly well; will continue to monitor exercise/activity progression.          Exercise Goals and Review:      Exercise Goals    Row Name 08/26/16 1413             Exercise Goals   Increase Physical Activity Yes       Intervention Provide advice, education, support and counseling about physical activity/exercise needs.;Develop an individualized exercise prescription for aerobic and resistive training based on initial evaluation findings, risk stratification, comorbidities and participant's personal goals.  Expected Outcomes Achievement of increased cardiorespiratory fitness and enhanced flexibility, muscular endurance and strength shown through measurements of functional capacity and personal statement of participant.       Increase Strength and Stamina Yes       Intervention Provide advice, education, support and counseling about physical activity/exercise needs.;Develop an individualized exercise prescription for aerobic and resistive training based on initial evaluation findings, risk stratification, comorbidities and participant's personal goals.       Expected Outcomes Achievement of increased cardiorespiratory fitness and enhanced flexibility, muscular endurance and strength shown through measurements of functional capacity and personal statement of participant.          Exercise Goals Re-Evaluation :    Discharge Exercise Prescription (Final Exercise Prescription Changes):     Exercise Prescription Changes - 09/13/16 1000      Response to Exercise   Blood Pressure (Admit) 98/62   Blood Pressure (Exercise) 119/78   Blood Pressure (Exit) 108/60   Heart Rate (Admit) 75 bpm   Heart Rate (Exercise) 102 bpm   Heart Rate (Exit) 75 bpm   Rating of Perceived  Exertion (Exercise) 11   Symptoms none    Duration Continue with 30 min of aerobic exercise without signs/symptoms of physical distress.   Intensity THRR unchanged     Progression   Progression Continue to progress workloads to maintain intensity without signs/symptoms of physical distress.   Average METs 2.9     Resistance Training   Training Prescription No     Treadmill   MPH 2.8   Grade 1   Minutes 10   METs 3.53     Recumbant Bike   Level 2.5   Minutes 10     NuStep   Level 4   Minutes 10   METs 2.4      Nutrition:  Target Goals: Understanding of nutrition guidelines, daily intake of sodium 1500mg , cholesterol 200mg , calories 30% from fat and 7% or less from saturated fats, daily to have 5 or more servings of fruits and vegetables.  Biometrics:     Pre Biometrics - 08/26/16 1607      Pre Biometrics   Height 6\' 1"  (1.854 m)   Weight 231 lb 4.2 oz (104.9 kg)   Waist Circumference 43 inches   Hip Circumference 45.5 inches   Waist to Hip Ratio 0.95 %   BMI (Calculated) 30.6   Triceps Skinfold 15 mm   % Body Fat 29.8 %   Grip Strength 43 kg   Flexibility 7 in   Single Leg Stand 18.9 seconds       Nutrition Therapy Plan and Nutrition Goals:     Nutrition Therapy & Goals - 08/26/16 1546      Nutrition Therapy   Diet Therapeutic Lifestyle Changes     Personal Nutrition Goals   Nutrition Goal Wt loss of 1-2 lb/week to a wt loss goal of 6-24 lb at graduation from [place].      Intervention Plan   Intervention Prescribe, educate and counsel regarding individualized specific dietary modifications aiming towards targeted core components such as weight, hypertension, lipid management, diabetes, heart failure and other comorbidities.   Expected Outcomes Short Term Goal: Understand basic principles of dietary content, such as calories, fat, sodium, cholesterol and nutrients.;Long Term Goal: Adherence to prescribed nutrition plan.      Nutrition  Discharge: Nutrition Scores:     Nutrition Assessments - 08/26/16 1545      MEDFICTS Scores   Pre Score 3  Nutrition Goals Re-Evaluation:   Nutrition Goals Re-Evaluation:   Nutrition Goals Discharge (Final Nutrition Goals Re-Evaluation):   Psychosocial: Target Goals: Acknowledge presence or absence of significant depression and/or stress, maximize coping skills, provide positive support system. Participant is able to verbalize types and ability to use techniques and skills needed for reducing stress and depression.  Initial Review & Psychosocial Screening:     Initial Psych Review & Screening - 09/12/16 1923      Barriers   Psychosocial barriers to participate in program There are no identifiable barriers or psychosocial needs.     Screening Interventions   Interventions Encouraged to exercise      Quality of Life Scores:     Quality of Life - 08/26/16 1548      Quality of Life Scores   Health/Function Pre 18.57 %   Socioeconomic Pre 28.75 %   Psych/Spiritual Pre 25 %   Family Pre 22.8 %   GLOBAL Pre 22.55 %      PHQ-9: Recent Review Flowsheet Data    Depression screen [facility] 2/9 09/03/2016 04/12/2016 04/15/2015 11/26/2013   Decreased Interest 0 0 0 0   Down, Depressed, Hopeless 0 0 0 0   PHQ - 2 Score 0 0 0 0     Interpretation of Total Score  Total Score Depression Severity:  1-4 = Minimal depression, 5-9 = Mild depression, 10-14 = Moderate depression, 15-19 = Moderately severe depression, 20-27 = Severe depression   Psychosocial Evaluation and Intervention:     Psychosocial Evaluation - 09/12/16 1923      Psychosocial Evaluation & Interventions   Interventions Encouraged to exercise with the program and follow exercise prescription   Comments pt demonstrates healthy and positive coping skills. pt has the support of his twin brother who is also a fellow participatnt in the cardiac rehab program      Psychosocial Re-Evaluation:   Psychosocial  Discharge (Final Psychosocial Re-Evaluation):   Vocational Rehabilitation: Provide vocational rehab assistance to qualifying candidates.   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 Participant will state understanding/return demonstration of topics presented.  Learning Barriers/Preferences:     Learning Barriers/Preferences - 08/26/16 1406      Learning Barriers/Preferences   Learning Barriers None   Learning Preferences Verbal Instruction;Written Material;Skilled Demonstration      Education Topics: Count Your Pulse:  -Group instruction provided by verbal instruction, demonstration, patient participation and written materials to support subject.  Instructors address importance of being able to find your pulse and how to count your pulse when at home without a heart monitor.  Patients get hands on experience counting their pulse with staff help and individually.   CARDIAC REHAB PHASE II EXERCISE from 09/08/2016 in [place]  Date  09/03/16  Educator  RN  Instruction Review Code  2- meets goals/outcomes      Heart Attack, Angina, and Risk Factor Modification:  -Group instruction provided by verbal instruction, video, and written materials to support subject.  Instructors address signs and symptoms of angina and heart attacks.    Also discuss risk factors for heart disease and how to make changes to improve heart health risk factors.  Functional Fitness:  -Group instruction provided by verbal instruction, demonstration, patient participation, and written materials to support subject.  Instructors address safety measures for doing things around  the house.  Discuss how to get up and down off the floor, how to pick things up properly, how to safely get out of a chair without assistance, and balance training.   Meditation and Mindfulness:  -Group instruction provided by verbal instruction, patient participation, and written materials to support subject.  Instructor addresses importance of mindfulness and meditation practice to help reduce stress and improve awareness.  Instructor also leads participants through a meditation exercise.    Stretching for Flexibility and Mobility:  -Group instruction provided by verbal instruction, patient participation, and written materials to support subject.  Instructors lead participants through series of stretches that are designed to increase flexibility thus improving mobility.  These stretches are additional exercise for major muscle groups that are typically performed during regular warm up and cool down.   Hands Only CPR:  -Group verbal, video, and participation provides a basic overview of AHA guidelines for community CPR. Role-play of emergencies allow participants the opportunity to practice calling for help and chest compression technique with discussion of AED use.   Hypertension: -Group verbal and written instruction that provides a basic overview of hypertension including the most recent diagnostic guidelines, risk factor reduction with self-care instructions and medication management.    Nutrition I class: Heart Healthy Eating:  -Group instruction provided by PowerPoint slides, verbal discussion, and written materials to support subject matter. The instructor gives an explanation and review of the Therapeutic Lifestyle Changes diet recommendations, which includes a discussion on lipid goals, dietary fat, sodium, fiber, plant stanol/sterol esters, sugar, and the components of a well-balanced, healthy diet.   Nutrition II class: Lifestyle Skills:  -Group instruction provided by PowerPoint  slides, verbal discussion, and written materials to support subject matter. The instructor gives an explanation and review of label reading, grocery shopping for heart health, heart healthy recipe modifications, and ways to make healthier choices when eating out.   Diabetes Question & Answer:  -Group instruction provided by PowerPoint slides, verbal discussion, and written materials to support subject matter. The instructor gives an explanation and review of diabetes co-morbidities, pre- and post-prandial blood glucose goals, pre-exercise blood glucose goals, signs, symptoms, and treatment of hypoglycemia and hyperglycemia, and foot care basics.   Diabetes Blitz:  -Group instruction provided by PowerPoint slides, verbal discussion, and written materials to support subject matter. The instructor gives an explanation and review of the physiology behind type 1 and type 2 diabetes, diabetes medications and rational behind using different medications, pre- and post-prandial blood glucose recommendations and Hemoglobin A1c goals, diabetes diet, and exercise including blood glucose guidelines for exercising safely.    Portion Distortion:  -Group instruction provided by PowerPoint slides, verbal discussion, written materials, and food models to support subject matter. The instructor gives an explanation of serving size versus portion size, changes in portions sizes over the last 20 years, and what consists of a serving from each food group.   Stress Management:  -Group instruction provided by verbal instruction, video, and written materials to support subject matter.  Instructors review role of stress in heart disease and how to cope with stress positively.     Exercising on Your Own:  -Group instruction provided by verbal instruction, power point, and written materials to support subject.  Instructors discuss benefits of exercise, components of exercise, frequency and intensity of exercise, and end  points for exercise.  Also discuss use of nitroglycerin and activating EMS.  Review options of places to exercise outside of rehab.  Review guidelines for sex with heart disease.   Cardiac Drugs I:  -Group instruction provided by verbal instruction and written materials to support subject.  Instructor reviews cardiac drug classes: antiplatelets, anticoagulants, beta blockers, and statins.  Instructor discusses reasons, side effects, and lifestyle considerations for each drug class.   Cardiac Drugs II:  -Group instruction provided by verbal instruction and written materials to support subject.  Instructor reviews cardiac drug classes: angiotensin converting enzyme inhibitors (ACE-I), angiotensin II receptor blockers (ARBs), nitrates, and calcium channel blockers.  Instructor discusses reasons, side effects, and lifestyle considerations for each drug class.   Anatomy and Physiology of the Circulatory System:  Group verbal and written instruction and models provide basic cardiac anatomy and physiology, with the coronary electrical and arterial systems. Review of: AMI, Angina, Valve disease, Heart Failure, Peripheral Artery Disease, Cardiac Arrhythmia, Pacemakers, and the ICD.   Other Education:  -Group or individual verbal, written, or video instructions that support the educational goals of the cardiac rehab program.   Knowledge Questionnaire Score:     Knowledge Questionnaire Score - 08/26/16 1543      Knowledge Questionnaire Score   Pre Score 22/24      Core Components/Risk Factors/Patient Goals at Admission:     Personal Goals and Risk Factors at Admission - 08/26/16 1419      Core Components/Risk Factors/Patient Goals on Admission    Weight Management Yes;Obesity;Weight Maintenance;Weight Loss   Intervention Weight Management: Develop a combined nutrition and exercise program designed to reach desired caloric intake, while maintaining appropriate intake of nutrient and fiber,  sodium and fats, and appropriate energy expenditure required for the weight goal.;Weight Management: Provide education and appropriate resources to help participant work on and attain dietary goals.;Weight Management/Obesity: Establish reasonable short term and long term weight goals.;Obesity: Provide education and appropriate resources to help participant work on and attain dietary goals.   Expected Outcomes Short Term: Continue to assess and modify interventions until short term weight is achieved;Long Term: Adherence to nutrition and physical activity/exercise program aimed toward attainment of established weight goal;Weight Maintenance: Understanding of the daily nutrition guidelines, which includes 25-35% calories from fat, 7% or less cal from saturated fats, less than 200mg  cholesterol, less than 1.5gm of sodium, & 5 or more servings of fruits and vegetables daily;Weight Loss: Understanding of general recommendations for a balanced deficit meal plan, which promotes 1-2 lb weight loss per week and includes a negative energy balance of [phone] kcal/d;Understanding recommendations for meals to include 15-35% energy as protein, 25-35% energy from fat, 35-60% energy from carbohydrates, less than 200mg  of dietary cholesterol, 20-35 gm of total fiber daily;Understanding of distribution of calorie intake throughout the day with the consumption of 4-5 meals/snacks   Hypertension Yes   Intervention Provide education on lifestyle modifcations including regular physical activity/exercise, weight management, moderate sodium restriction and increased consumption of fresh fruit, vegetables, and low fat dairy, alcohol moderation, and smoking cessation.;Monitor prescription use compliance.   Expected Outcomes Short Term: Continued assessment and intervention until BP is < 140/65mm HG in hypertensive participants. < 130/17mm HG in hypertensive participants with diabetes, heart failure or chronic kidney disease.;Long Term:  Maintenance of blood pressure at goal levels.   Lipids Yes   Intervention Provide education and support for participant on nutrition & aerobic/resistive exercise along with prescribed medications to achieve LDL 70mg , HDL >40mg .  Expected Outcomes Short Term: Participant states understanding of desired cholesterol values and is compliant with medications prescribed. Participant is following exercise prescription and nutrition guidelines.;Long Term: Cholesterol controlled with medications as prescribed, with individualized exercise RX and with personalized nutrition plan. Value goals: LDL < 70mg , HDL > 40 mg.      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 toward achieving risk factor reduction       Expected Outcomes Pt achieves and/or make progress toward his desired weight, improvement of blood lipid levels and bp readings within normal limits          Core Components/Risk Factors/Patient Goals at Discharge (Final Review):      Goals and Risk Factor Review - 09/12/16 1920      Core Components/Risk Factors/Patient Goals Review   Personal Goals Review Weight Management/Obesity;Hypertension;Lipids   Review Pt is a retired Engineer, 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 Fransico Him, Medical Director          Comments:  [name] is off to a great start and  is making the expected progress toward personal goals after completing  5 sessions.  Psychosocial Assessment -Pt exhibits positive coping skills, hopeful outlook with supportive family. No psychosocial needs identified at this time, no psychosocial interventions necessary. Pt denies any angina or chest pain during exercise. Pt remarks that he has some brief "mild" episodes at time. Episodes do not require any further medical treatment. Recommend continued exercise and life style modification education including  stress management and relaxation techniques to decrease cardiac risk profile. Cherre Huger, BSN Cardiac and Training and development officer

## 2016-09-13 ENCOUNTER — Encounter (HOSPITAL_COMMUNITY)
Admission: RE | Admit: 2016-09-13 | Discharge: 2016-09-13 | Disposition: A | Discharge status: Home / Self Care | Payer: Medicare Other | Source: Ambulatory Visit | Attending: Cardiovascular Disease | Admitting: Cardiovascular Disease

## 2016-09-13 DIAGNOSIS — Z7901 Long term (current) use of anticoagulants: Secondary | ICD-10-CM | POA: Diagnosis not present

## 2016-09-13 DIAGNOSIS — I208 Other forms of angina pectoris: Secondary | ICD-10-CM | POA: Diagnosis not present

## 2016-09-13 DIAGNOSIS — I350 Nonrheumatic aortic (valve) stenosis: Secondary | ICD-10-CM | POA: Diagnosis not present

## 2016-09-13 DIAGNOSIS — N400 Enlarged prostate without lower urinary tract symptoms: Secondary | ICD-10-CM | POA: Diagnosis not present

## 2016-09-13 DIAGNOSIS — Z79899 Other long term (current) drug therapy: Secondary | ICD-10-CM | POA: Diagnosis not present

## 2016-09-13 DIAGNOSIS — I2089 Other forms of angina pectoris: Secondary | ICD-10-CM

## 2016-09-13 DIAGNOSIS — M1990 Unspecified osteoarthritis, unspecified site: Secondary | ICD-10-CM | POA: Diagnosis not present

## 2016-09-15 ENCOUNTER — Encounter (HOSPITAL_COMMUNITY)
Admission: RE | Admit: 2016-09-15 | Discharge: 2016-09-15 | Disposition: A | Discharge status: Home / Self Care | Payer: Medicare Other | Source: Ambulatory Visit | Attending: Cardiovascular Disease | Admitting: Cardiovascular Disease

## 2016-09-15 DIAGNOSIS — N400 Enlarged prostate without lower urinary tract symptoms: Secondary | ICD-10-CM | POA: Diagnosis not present

## 2016-09-15 DIAGNOSIS — I208 Other forms of angina pectoris: Secondary | ICD-10-CM

## 2016-09-15 DIAGNOSIS — I350 Nonrheumatic aortic (valve) stenosis: Secondary | ICD-10-CM | POA: Diagnosis not present

## 2016-09-15 DIAGNOSIS — M1990 Unspecified osteoarthritis, unspecified site: Secondary | ICD-10-CM | POA: Diagnosis not present

## 2016-09-15 DIAGNOSIS — Z79899 Other long term (current) drug therapy: Secondary | ICD-10-CM | POA: Diagnosis not present

## 2016-09-15 DIAGNOSIS — Z7901 Long term (current) use of anticoagulants: Secondary | ICD-10-CM | POA: Diagnosis not present

## 2016-09-15 NOTE — Progress Notes (Signed)
Reviewed home exercise with pt today.  Pt plans to use stationary bike, treadmill and other exercise equipment at home gym.  Reviewed THR, pulse, RPE, sign and symptoms, NTG use, and when to call 911 or MD.  Also discussed weather considerations and indoor options.  Pt voiced understanding.    Gaye Scorza [employer]

## 2016-09-16 ENCOUNTER — Encounter (HOSPITAL_BASED_OUTPATIENT_CLINIC_OR_DEPARTMENT_OTHER): Payer: Medicare Other

## 2016-09-17 ENCOUNTER — Encounter (HOSPITAL_COMMUNITY)
Admission: RE | Admit: 2016-09-17 | Discharge: 2016-09-17 | Disposition: A | Discharge status: Home / Self Care | Payer: Medicare Other | Source: Ambulatory Visit | Attending: Cardiovascular Disease | Admitting: Cardiovascular Disease

## 2016-09-17 DIAGNOSIS — I350 Nonrheumatic aortic (valve) stenosis: Secondary | ICD-10-CM | POA: Diagnosis not present

## 2016-09-17 DIAGNOSIS — Z79899 Other long term (current) drug therapy: Secondary | ICD-10-CM | POA: Diagnosis not present

## 2016-09-17 DIAGNOSIS — I208 Other forms of angina pectoris: Secondary | ICD-10-CM

## 2016-09-17 DIAGNOSIS — M1990 Unspecified osteoarthritis, unspecified site: Secondary | ICD-10-CM | POA: Diagnosis not present

## 2016-09-17 DIAGNOSIS — Z7901 Long term (current) use of anticoagulants: Secondary | ICD-10-CM | POA: Diagnosis not present

## 2016-09-17 DIAGNOSIS — N400 Enlarged prostate without lower urinary tract symptoms: Secondary | ICD-10-CM | POA: Diagnosis not present

## 2016-09-20 ENCOUNTER — Encounter (HOSPITAL_COMMUNITY): Payer: Medicare Other

## 2016-09-20 DIAGNOSIS — L988 Other specified disorders of the skin and subcutaneous tissue: Secondary | ICD-10-CM | POA: Diagnosis not present

## 2016-09-20 DIAGNOSIS — Z85828 Personal history of other malignant neoplasm of skin: Secondary | ICD-10-CM | POA: Diagnosis not present

## 2016-09-20 DIAGNOSIS — C4442 Squamous cell carcinoma of skin of scalp and neck: Secondary | ICD-10-CM | POA: Diagnosis not present

## 2016-09-21 ENCOUNTER — Telehealth: Payer: Self-pay | Admitting: Internal Medicine

## 2016-09-21 NOTE — Telephone Encounter (Signed)
New Message:   Please call,having problems with his Pacemaker. He would like to be seen this week before he goes out of town.

## 2016-09-21 NOTE — Telephone Encounter (Signed)
Melissa to call and schedule an apt.

## 2016-09-21 NOTE — Telephone Encounter (Signed)
LVM on home and cell phone.

## 2016-09-22 ENCOUNTER — Encounter (HOSPITAL_COMMUNITY)
Admission: RE | Admit: 2016-09-22 | Discharge: 2016-09-22 | Disposition: A | Discharge status: Home / Self Care | Payer: Medicare Other | Source: Ambulatory Visit | Attending: Cardiovascular Disease | Admitting: Cardiovascular Disease

## 2016-09-22 DIAGNOSIS — I208 Other forms of angina pectoris: Secondary | ICD-10-CM

## 2016-09-22 DIAGNOSIS — I350 Nonrheumatic aortic (valve) stenosis: Secondary | ICD-10-CM | POA: Diagnosis not present

## 2016-09-22 DIAGNOSIS — Z79899 Other long term (current) drug therapy: Secondary | ICD-10-CM | POA: Diagnosis not present

## 2016-09-22 DIAGNOSIS — M1990 Unspecified osteoarthritis, unspecified site: Secondary | ICD-10-CM | POA: Diagnosis not present

## 2016-09-22 DIAGNOSIS — N400 Enlarged prostate without lower urinary tract symptoms: Secondary | ICD-10-CM | POA: Diagnosis not present

## 2016-09-22 DIAGNOSIS — Z7901 Long term (current) use of anticoagulants: Secondary | ICD-10-CM | POA: Diagnosis not present

## 2016-09-24 ENCOUNTER — Encounter (HOSPITAL_COMMUNITY)
Admission: RE | Admit: 2016-09-24 | Discharge: 2016-09-24 | Disposition: A | Discharge status: Home / Self Care | Payer: Medicare Other | Source: Ambulatory Visit | Attending: Cardiovascular Disease | Admitting: Cardiovascular Disease

## 2016-09-24 DIAGNOSIS — I208 Other forms of angina pectoris: Secondary | ICD-10-CM

## 2016-09-24 DIAGNOSIS — M1990 Unspecified osteoarthritis, unspecified site: Secondary | ICD-10-CM | POA: Diagnosis not present

## 2016-09-24 DIAGNOSIS — N400 Enlarged prostate without lower urinary tract symptoms: Secondary | ICD-10-CM | POA: Diagnosis not present

## 2016-09-24 DIAGNOSIS — Z79899 Other long term (current) drug therapy: Secondary | ICD-10-CM | POA: Diagnosis not present

## 2016-09-24 DIAGNOSIS — Z7901 Long term (current) use of anticoagulants: Secondary | ICD-10-CM | POA: Diagnosis not present

## 2016-09-24 DIAGNOSIS — I350 Nonrheumatic aortic (valve) stenosis: Secondary | ICD-10-CM | POA: Diagnosis not present

## 2016-09-27 ENCOUNTER — Encounter (HOSPITAL_COMMUNITY)
Admission: RE | Admit: 2016-09-27 | Discharge: 2016-09-27 | Disposition: A | Discharge status: Home / Self Care | Payer: Medicare Other | Source: Ambulatory Visit | Attending: Cardiovascular Disease | Admitting: Cardiovascular Disease

## 2016-09-27 DIAGNOSIS — Z79899 Other long term (current) drug therapy: Secondary | ICD-10-CM | POA: Diagnosis not present

## 2016-09-27 DIAGNOSIS — Z7901 Long term (current) use of anticoagulants: Secondary | ICD-10-CM | POA: Diagnosis not present

## 2016-09-27 DIAGNOSIS — I350 Nonrheumatic aortic (valve) stenosis: Secondary | ICD-10-CM | POA: Diagnosis not present

## 2016-09-27 DIAGNOSIS — I208 Other forms of angina pectoris: Secondary | ICD-10-CM | POA: Diagnosis not present

## 2016-09-27 DIAGNOSIS — N400 Enlarged prostate without lower urinary tract symptoms: Secondary | ICD-10-CM | POA: Diagnosis not present

## 2016-09-27 DIAGNOSIS — M1990 Unspecified osteoarthritis, unspecified site: Secondary | ICD-10-CM | POA: Diagnosis not present

## 2016-09-28 ENCOUNTER — Ambulatory Visit (INDEPENDENT_AMBULATORY_CARE_PROVIDER_SITE_OTHER): Payer: Medicare Other | Admitting: Rheumatology

## 2016-09-28 ENCOUNTER — Telehealth: Payer: Self-pay | Admitting: Rheumatology

## 2016-09-28 ENCOUNTER — Ambulatory Visit (INDEPENDENT_AMBULATORY_CARE_PROVIDER_SITE_OTHER): Payer: Medicare Other | Admitting: Internal Medicine

## 2016-09-28 ENCOUNTER — Other Ambulatory Visit: Payer: Self-pay | Admitting: *Deleted

## 2016-09-28 ENCOUNTER — Encounter: Payer: Self-pay | Admitting: Internal Medicine

## 2016-09-28 ENCOUNTER — Encounter: Payer: Self-pay | Admitting: Rheumatology

## 2016-09-28 VITALS — BP 158/93 | HR 72 | Ht 73.0 in | Wt 221.0 lb

## 2016-09-28 DIAGNOSIS — M5030 Other cervical disc degeneration, unspecified cervical region: Secondary | ICD-10-CM

## 2016-09-28 DIAGNOSIS — Z950 Presence of cardiac pacemaker: Secondary | ICD-10-CM | POA: Diagnosis not present

## 2016-09-28 DIAGNOSIS — G8929 Other chronic pain: Secondary | ICD-10-CM | POA: Diagnosis not present

## 2016-09-28 DIAGNOSIS — M19042 Primary osteoarthritis, left hand: Secondary | ICD-10-CM

## 2016-09-28 DIAGNOSIS — I25118 Atherosclerotic heart disease of native coronary artery with other forms of angina pectoris: Secondary | ICD-10-CM | POA: Diagnosis not present

## 2016-09-28 DIAGNOSIS — R5383 Other fatigue: Secondary | ICD-10-CM

## 2016-09-28 DIAGNOSIS — Z7901 Long term (current) use of anticoagulants: Secondary | ICD-10-CM

## 2016-09-28 DIAGNOSIS — M791 Myalgia, unspecified site: Secondary | ICD-10-CM

## 2016-09-28 DIAGNOSIS — I480 Paroxysmal atrial fibrillation: Secondary | ICD-10-CM

## 2016-09-28 DIAGNOSIS — M25512 Pain in left shoulder: Secondary | ICD-10-CM

## 2016-09-28 DIAGNOSIS — M7061 Trochanteric bursitis, right hip: Secondary | ICD-10-CM

## 2016-09-28 DIAGNOSIS — Z952 Presence of prosthetic heart valve: Secondary | ICD-10-CM

## 2016-09-28 DIAGNOSIS — M19041 Primary osteoarthritis, right hand: Secondary | ICD-10-CM | POA: Diagnosis not present

## 2016-09-28 DIAGNOSIS — E559 Vitamin D deficiency, unspecified: Secondary | ICD-10-CM

## 2016-09-28 DIAGNOSIS — D472 Monoclonal gammopathy: Secondary | ICD-10-CM

## 2016-09-28 DIAGNOSIS — I208 Other forms of angina pectoris: Secondary | ICD-10-CM

## 2016-09-28 DIAGNOSIS — I209 Angina pectoris, unspecified: Secondary | ICD-10-CM

## 2016-09-28 DIAGNOSIS — D6859 Other primary thrombophilia: Secondary | ICD-10-CM

## 2016-09-28 DIAGNOSIS — M47816 Spondylosis without myelopathy or radiculopathy, lumbar region: Secondary | ICD-10-CM | POA: Diagnosis not present

## 2016-09-28 DIAGNOSIS — Z8673 Personal history of transient ischemic attack (TIA), and cerebral infarction without residual deficits: Secondary | ICD-10-CM | POA: Diagnosis not present

## 2016-09-28 DIAGNOSIS — M47812 Spondylosis without myelopathy or radiculopathy, cervical region: Secondary | ICD-10-CM

## 2016-09-28 LAB — CUP PACEART INCLINIC DEVICE CHECK
Implantable Lead Implant Date: 20170417
Implantable Lead Model: 7741
Implantable Lead Serial Number: 662696
Implantable Pulse Generator Implant Date: 20170417
Lead Channel Impedance Value: 795 Ohm
Lead Channel Pacing Threshold Amplitude: 0.5 V
Lead Channel Pacing Threshold Pulse Width: 0.5 ms
Lead Channel Sensing Intrinsic Amplitude: 5.6 mV
Lead Channel Setting Pacing Pulse Width: 0.4 ms
Lead Channel Setting Sensing Sensitivity: 2.5 mV
MDC IDC LEAD IMPLANT DT: 20170417
MDC IDC LEAD LOCATION: 753859
MDC IDC LEAD LOCATION: 753860
MDC IDC LEAD SERIAL: 751382
MDC IDC MSMT LEADCHNL RA IMPEDANCE VALUE: 716 Ohm
MDC IDC MSMT LEADCHNL RV PACING THRESHOLD AMPLITUDE: 0.5 V
MDC IDC MSMT LEADCHNL RV PACING THRESHOLD PULSEWIDTH: 0.4 ms
MDC IDC MSMT LEADCHNL RV SENSING INTR AMPL: 17.7 mV
MDC IDC PG SERIAL: 718418
MDC IDC SESS DTM: 20180731040000
MDC IDC SET LEADCHNL RA PACING AMPLITUDE: 2.5 V
MDC IDC SET LEADCHNL RV PACING AMPLITUDE: 2.5 V

## 2016-09-28 MED ORDER — HYDROCODONE-ACETAMINOPHEN 10-325 MG PO TABS: 1.0000 | ORAL_TABLET | Freq: Two times a day (BID) | ORAL | 0 refills | Status: DC | PRN

## 2016-09-28 NOTE — Patient Instructions (Signed)
Natural anti-inflammatories  You can purchase these at [employer], [employer] or online.   . Ginger (ginger root or capsules)  . Omega 3 (Fish, flax seeds, chia seeds, walnuts, almonds)  . Tart cherry (dried or extract)   Patient should be under the care of a physician while taking these supplements. This may not be reproduced without the permission of Dr. Idabell Picking.  

## 2016-09-28 NOTE — Patient Instructions (Signed)

## 2016-09-28 NOTE — Telephone Encounter (Signed)
Patient requesting refill on Hydrocodone.   06/01/16 last visit UDS: 04/2016 + Benzos, Barbituates Hyodrocodone Consistent with meds used  No return visit sch sent message to schedule  Ok to refill Hydrocodone?

## 2016-09-28 NOTE — Progress Notes (Signed)
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 He is a 82 yo man with a multiple medical problems, s/p PPM, AVR, and multiple PCI's who has had atrial fib/flutter with a RVR/CVR. He has begun cardiac rehab and feels well. His vitals are stable and his exercise tolerance has been good. He does not have palpitations. No syncope. No chest pain. No edema.  Allergies  Allergen Reactions  . Contrast Media [Iodinated Diagnostic Agents] Hives  . Gadolinium Derivatives Hives  . Metrizamide Hives     Current Outpatient Prescriptions  Medication Sig Dispense Refill  . apixaban (ELIQUIS) 5 MG TABS tablet Take 1 tablet (5 mg total) by mouth 2 (two) times daily. 180 tablet 3  . bisacodyl (DULCOLAX) 5 MG EC tablet Take 5 mg by mouth daily as needed (constipation).     . butalbital-acetaminophen-caffeine (FIORICET, ESGIC) 50-325-40 MG per tablet Take 1 tablet by mouth 2 (two) times daily as needed (osteo-arthritis pain).     . Cholecalciferol (VITAMIN D3) 2000 units capsule Take 1 capsule by mouth daily.    . Coenzyme Q10 (COQ10) 100 MG CAPS Take 100 mg by mouth daily.    . Diclofenac Sodium 1.5 % SOLN Take 1 application by mouth 2 (two) times daily as needed (Knee pain).     Marland Kitchen doxazosin (CARDURA) 4 MG tablet Take 4 mg by mouth at bedtime.     Marland Kitchen HYDROcodone-acetaminophen (NORCO) 10-325 MG tablet Take 1 tablet by mouth 2 (two) times daily as needed. 100 tablet 0  . LORazepam (ATIVAN) 1 MG tablet Take 1 mg by mouth at bedtime.     . magnesium oxide (MAG-OX) 400 MG tablet Take 400 mg by mouth daily.    . metoprolol succinate (TOPROL-XL) 100 MG 24 hr tablet Take 1 tablet (100 mg total) by mouth 2 (two) times daily. 180 tablet 3  . nitroGLYCERIN (NITROSTAT) 0.4 MG SL tablet Place 1 tablet (0.4 mg total) under the tongue every 5 (five) minutes as needed for chest pain. 25 tablet 3  . OVER THE COUNTER MEDICATION Apply 1 patch topically daily as needed (neck and shoulder pain). Chinese pain patch    .  potassium chloride SA (K-DUR,KLOR-CON) 20 MEQ tablet TAKE 1 TABLET BY MOUTH ONCE DAILY 90 tablet 3  . predniSONE (DELTASONE) 5 MG tablet Take 2.5-5 mg by mouth daily as needed.     . rosuvastatin (CRESTOR) 20 MG tablet Take 1 tablet by mouth at bedtime.     No current facility-administered medications for this visit.      Past Medical History:  Diagnosis Date  . Aortic stenosis    moderate aortic stenosis  . Arthritis   . Benign prostatic hypertrophy   . 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  . Coronary artery disease    status post multiple prior percutaneous coronary interventions, microvascular angina per Dr Olevia Perches  . Heart murmur   . Hypercoagulable state ([place])    chronically anticoagulated with coumadin  . Hyperlipidemia   . Hyperthyroidism   . Hypothyroidism    Dr. Elyse Hsu  . MGUS (monoclonal gammopathy of unknown significance) 02/17/2013  . Paroxysmal atrial fibrillation ([place])    DR. Lia Foyer,   . Stroke ([place])    1990    ROS:   All systems reviewed and negative except as noted in the HPI.   Past Surgical History:  Procedure Laterality Date  . AORTIC VALVE REPLACEMENT  03/15/2011   Procedure: AORTIC VALVE REPLACEMENT (AVR);  Surgeon: Gaye Pollack, MD;  Location: [place];  Service: Open Heart Surgery;  Laterality: N/A;  . APPENDECTOMY    . CARDIAC CATHETERIZATION     11  . CARDIOVERSION    . CARDIOVERSION  04/15/2011   Procedure: CARDIOVERSION;  Surgeon: Loralie Champagne, MD;  Location: [place];  Service: Cardiovascular;  Laterality: N/A;  . CARDIOVERSION N/A 09/11/2014   Procedure: CARDIOVERSION;  Surgeon: Sueanne Margarita, MD;  Location[facility] ENDOSCOPY;  Service: Cardiovascular;  Laterality: N/A;  . CARDIOVERSION N/A 06/27/2015   Procedure: CARDIOVERSION;  Surgeon: Thayer Headings, MD;  [facility]  ENDOSCOPY;  Service: Cardiovascular;  Laterality: N/A;  . CARDIOVERSION N/A 07/04/2015   Procedure: CARDIOVERSION;  Surgeon: Evans Lance, MD;  Location: [place];  Service: Cardiovascular;  Laterality: N/A;  . EP IMPLANTABLE DEVICE N/A 06/16/2015   Procedure: Pacemaker Implant;  Surgeon: Evans Lance, MD;  Location: [place] CV LAB;  Service: Cardiovascular;  Laterality: N/A;  . hemrrhoidectomy    . LEFT AND RIGHT HEART CATHETERIZATION WITH CORONARY ANGIOGRAM Bilateral 02/01/2011   Procedure: LEFT AND RIGHT HEART CATHETERIZATION WITH CORONARY ANGIOGRAM;  Surgeon: Hillary Bow, MD;  Location: [facility] CATH LAB;  Service: Cardiovascular;  Laterality: Bilateral;  . MAZE  03/15/2011   Procedure: MAZE;  Surgeon: Gaye Pollack, MD;  Location: [place];  Service: Open Heart Surgery;  Laterality: N/A;  . PACEMAKER INSERTION  1991   Guidant PPM, most recent Generator Change by Dr Olevia Perches was 08/22/06  . RIGHT/LEFT HEART CATH AND CORONARY ANGIOGRAPHY N/A 07/06/2016   Procedure: Right/Left Heart Cath and Coronary Angiography;  Surgeon: Sherren Mocha, MD;  Location: [place] CV LAB;  Service: Cardiovascular;  Laterality: N/A;  . TEE WITHOUT CARDIOVERSION  04/15/2011   Procedure: TRANSESOPHAGEAL ECHOCARDIOGRAM (TEE);  Surgeon: Loralie Champagne, MD;  Location: [place];  Service: Cardiovascular;  Laterality: N/A;  . TEE WITHOUT CARDIOVERSION N/A 09/11/2014   Procedure: TRANSESOPHAGEAL ECHOCARDIOGRAM (TEE);  Surgeon: Sueanne Margarita, MD;  Location: [facility] ENDOSCOPY;  Service: Cardiovascular;  Laterality: N/A;     Family History  Problem Relation Age of Onset  . Heart disease Brother        Twin brother has coronary disease and recent AVR for AS  . Anesthesia problems Neg Hx   . Hypotension Neg Hx   . Malignant hyperthermia Neg Hx   . Pseudochol deficiency Neg Hx      Social History   Social History  . Marital status: Married    Spouse name: N/A  . Number of children: N/A  . Years of education:  N/A   Occupational History  . Not on file.   Social History Main Topics  . Smoking status: Never Smoker  . Smokeless tobacco: Never Used  . Alcohol use No  . Drug use: No  . Sexual activity: Not on file   Other Topics Concern  . Not on file   Social History Narrative   Married. Has grown children   Retired Horticulturist, commercial MD           BP (!) 145/84   Pulse 62   Ht 6\' 1"  (1.854 m)   Wt 221 lb (100.2 kg)   SpO2 92%   BMI 29.16 kg/m   Physical Exam:  Well appearing 82 yo man, NAD HEENT: Unremarkable Neck:  6 cm JVD, no thyromegally Lymphatics:  No adenopathy Back:  No  CVA tenderness Lungs:  Clear with no wheezes HEART:  IRegular rate rhythm, no murmurs, no rubs, no clicks Abd:  soft, positive bowel sounds, no organomegally, no rebound, no guarding Ext:  2 plus pulses, no edema, no cyanosis, no clubbing Skin:  No rashes no nodules Neuro:  CN II through XII intact, motor grossly intact   DEVICE  Normal device function.  See PaceArt for details. Underlying rhythm atrial fib/flutter  Assess/Plan: 1. Atrial flutter/fib - his rates are well controlled. He will continue his current meds including toprol 100 bid. 2. CAD - he denies anginal symptoms 3. PPM - His [employer] device is working normally. Will recheck in several months. 4. HTN - his blood pressure is mostly well controlled. He notes occaisional episodes where his pressure goes high and then comes down after a few hours.   Mikle Bosworth.D.

## 2016-09-28 NOTE — Progress Notes (Signed)
Office Visit Note  Patient: Kevin Buff, MD             Date of Birth: 07/06/1934           MRN: [id number]             PCP: [name], MD Referring: Altheimer, Legrand Como, MD Visit Date: 09/28/2016 Occupation: @GUAROCC @    Subjective:  Joint Pain (Arthritis. 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 He continues to have discomfort in his C-spine, bilateral shoulders bilateral hips and lower back. His hands continue to be painful. His pain level is 8-9 on the scale of 0-10 without the medication. With hydrocodone he can bring his pain level down to about 4-5. He states he is able to function with the medication.  Activities of Daily Living:  Patient reports morning stiffness for0 hours.   Patient reports nocturnal pain. Bilateral shoulder pain Difficulty dressing/grooming: Denies Difficulty climbing stairs: Reports Difficulty getting out of chair: Reports Difficulty using hands for taps, buttons, cutlery, and/or writing: Reports   Review of Systems  Constitutional: Positive for fatigue. Negative for night sweats and weakness ( ).  HENT: Negative for mouth sores, mouth dryness and nose dryness.   Eyes: Negative for redness and dryness.  Respiratory: Negative for shortness of breath and difficulty breathing.   Cardiovascular: Positive for palpitations and hypertension. Negative for chest pain, irregular heartbeat and swelling in legs/feet.  Gastrointestinal: Negative for constipation and diarrhea.  Endocrine: Negative for increased urination.  Musculoskeletal: Positive for arthralgias, joint pain, myalgias and myalgias. Negative for joint swelling, muscle weakness and muscle tenderness.  Skin: Negative for color change, rash, hair loss, nodules/bumps, skin tightness, ulcers and sensitivity to sunlight.  Allergic/Immunologic: Negative for susceptible to infections.    Neurological: Negative for dizziness, fainting, memory loss and night sweats.  Hematological: Negative for swollen glands.  Psychiatric/Behavioral: Positive for sleep disturbance. Negative for depressed mood. The patient is not nervous/anxious.     PMFS History:  Patient Active Problem List   Diagnosis Date Noted  . Vitamin D deficiency 05/20/2016  . History of pacemaker 05/20/2016  . History of stroke/[place]  05/20/2016  . Primary osteoarthritis of both hands 03/10/2016  . DDD cervical spine 03/10/2016  . 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 Chronic left shoulder pain 03/10/2016  . Trochanteric bursitis of right hip 03/10/2016  . Other fatigue 03/10/2016  . Myalgia 03/10/2016  . Other chronic pain 03/10/2016  . 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 Typical atrial flutter ([place])   . PVC's (premature ventricular contractions) 01/30/2015  . Pacemaker 04/30/2013  . MGUS (monoclonal gammopathy of unknown significance) 02/17/2013  . Orthostatic hypotension 04/11/2011  . Long term current use of anticoagulant therapy 03/24/2011  . S/P AVR 03/24/2011  . Pleural effusion 03/24/2011  . Hypothyroidism 11/22/2010  . Atrial flutter ([place]) 08/19/2010  . CHEST PAIN 04/06/2010  . HYPERLIPIDEMIA-MIXED 12/08/2007  . PRIMARY HYPERCOAGULABLE STATE 12/08/2007  . Coronary artery disease with exertional angina ([place]) 12/08/2007  . CAD, NATIVE VESSEL 12/08/2007  . AORTIC STENOSIS/ INSUFFICIENCY, NON-RHEUMATIC 12/08/2007  . Atrial fibrillation ([place]) 12/08/2007  . Chronotropic incompetence with sinus node dysfunction (HCC) 12/08/2007    Past Medical History:  Diagnosis Date  . Aortic stenosis    moderate aortic stenosis  . Arthritis   . Benign prostatic hypertrophy   . Chronotropic incompetence with sinus node dysfunction (HCC)  Status post Guidant dual-mode, dual-pacing, dual-sensing  pacemaker   implantation now programmed to AAI with recent generator change.  . Coronary artery  disease    status post multiple prior percutaneous coronary interventions, microvascular angina per Dr Olevia Perches  . Heart murmur   . Hypercoagulable state ([place])    chronically anticoagulated with coumadin  . Hyperlipidemia   . Hyperthyroidism   . Hypothyroidism    Dr. Elyse Hsu  . MGUS (monoclonal gammopathy of unknown significance) 02/17/2013  . Paroxysmal atrial fibrillation ([place])    DR. Lia Foyer,   . Stroke [facility])    1990    Family History  Problem Relation Age of Onset  . Heart disease Brother        Twin brother has coronary disease and recent AVR for AS  . Anesthesia problems Neg Hx   . Hypotension Neg Hx   . Malignant hyperthermia Neg Hx   . Pseudochol deficiency Neg Hx    Past Surgical History:  Procedure Laterality Date  . AORTIC VALVE REPLACEMENT  03/15/2011   Procedure: AORTIC VALVE REPLACEMENT (AVR);  Surgeon: Gaye Pollack, MD;  Location: [place];  Service: Open Heart Surgery;  Laterality: N/A;  . APPENDECTOMY    . CARDIAC CATHETERIZATION     11  . CARDIOVERSION    . CARDIOVERSION  04/15/2011   Procedure: CARDIOVERSION;  Surgeon: Loralie Champagne, MD;  Location: [place];  Service: Cardiovascular;  Laterality: N/A;  . CARDIOVERSION N/A 09/11/2014   Procedure: CARDIOVERSION;  Surgeon: Sueanne Margarita, MD;  Location: [facility] ENDOSCOPY;  Service: Cardiovascular;  Laterality: N/A;  . CARDIOVERSION N/A 06/27/2015   Procedure: CARDIOVERSION;  Surgeon: Thayer Headings, MD;  Location: [facility] ENDOSCOPY;  Service: Cardiovascular;  Laterality: N/A;  . CARDIOVERSION N/A 07/04/2015   Procedure: CARDIOVERSION;  Surgeon: Evans Lance, MD;  Location: [place];  Service: Cardiovascular;  Laterality: N/A;  . EP IMPLANTABLE DEVICE N/A 06/16/2015   Procedure: Pacemaker Implant;  Surgeon: Evans Lance, MD;  Location: [place] CV LAB;  Service: Cardiovascular;  Laterality: N/A;  . hemrrhoidectomy    . LEFT AND RIGHT HEART CATHETERIZATION WITH CORONARY ANGIOGRAM Bilateral 02/01/2011    Procedure: LEFT AND RIGHT HEART CATHETERIZATION WITH CORONARY ANGIOGRAM;  Surgeon: Hillary Bow, MD;  Location: [facility] CATH LAB;  Service: Cardiovascular;  Laterality: Bilateral;  . MAZE  03/15/2011   Procedure: MAZE;  Surgeon: Gaye Pollack, MD;  Location: [place];  Service: Open Heart Surgery;  Laterality: N/A;  . PACEMAKER INSERTION  1991   Guidant PPM, most recent Generator Change by Dr Olevia Perches was 08/22/06  . RIGHT/LEFT HEART CATH AND CORONARY ANGIOGRAPHY N/A 07/06/2016   Procedure: Right/Left Heart Cath and Coronary Angiography;  Surgeon: Sherren Mocha, MD;  Location: [place] CV LAB;  Service: Cardiovascular;  Laterality: N/A;  . TEE WITHOUT CARDIOVERSION  04/15/2011   Procedure: TRANSESOPHAGEAL ECHOCARDIOGRAM (TEE);  Surgeon: Loralie Champagne, MD;  Location: [place];  Service: Cardiovascular;  Laterality: N/A;  . TEE WITHOUT CARDIOVERSION N/A 09/11/2014   Procedure: TRANSESOPHAGEAL ECHOCARDIOGRAM (TEE);  Surgeon: Sueanne Margarita, MD;  Location: [facility] ENDOSCOPY;  Service: Cardiovascular;  Laterality: N/A;   Social History   Social History Narrative   Married. Has grown children   Retired Horticulturist, commercial MD           Objective: Vital Signs: BP (!) 158/93 (BP Location: Left Arm, Patient Position: Sitting, Cuff Size: Normal)   Pulse 72   Ht 6\' 1"  (1.854 m)   Wt 221 lb (100.2 kg)  BMI 29.16 kg/m    Physical Exam  Constitutional: He is oriented to person, place, and time. He appears well-developed and well-nourished.  HENT:  Head: Normocephalic and atraumatic.  Eyes: Pupils are equal, round, and reactive to light. Conjunctivae and EOM are normal.  Neck: Normal range of motion. Neck supple.  Cardiovascular: Normal rate, regular rhythm and normal heart sounds.   Pacemaker  Pulmonary/Chest: Effort normal and breath sounds normal.  Abdominal: Soft. Bowel sounds are normal.  Neurological: He is alert and oriented to person, place, and time.  Skin: Skin is warm and dry. Capillary refill  takes less than 2 seconds.  Psychiatric: He has a normal mood and affect. His behavior is normal.  Nursing note and vitals reviewed.    Musculoskeletal Exam: C-spine and lumbar spine limited range of motion. Shoulder joints painful range of motion of 210 bilaterally. Elbow joints are good range of motion. He has DIP PIP thickening bilaterally. With incomplete fist formation. He has tenderness over bilateral trochanteric bursa. Hip joints are good range of motion. He is crepitus in his bilateral knee joints.  CDAI Exam: No CDAI exam completed.    Investigation: No additional findings. CBC Latest Ref Rng & Units 07/02/2016 04/05/2016 12/24/2015  WBC 3.4 - 10.8 x10E3/uL 6.2 6.9 7.5  Hemoglobin 13.0 - 17.7 g/dL 11.6(L) 12.4(L) 12.0(L)  Hematocrit 37.5 - 51.0 % 34.9(L) 38.4 37.2(L)  Platelets 150 - 379 x10E3/uL 209 289 246   BMP normalTSH normal, 04/30/2016 UDS consistent with current treatment Imaging: No results found.  Speciality Comments: No specialty comments available.    Procedures:  No procedures performed Allergies: Contrast media [iodinated diagnostic agents]; Gadolinium derivatives; and Metrizamide   Assessment / Plan:     Visit Diagnoses: Primary osteoarthritis of both hands: He has severe osteoarthritis in his bilateral hands with decreased range of motion and chronic pain. We discussed some natural aunt and chemistries.  DDD cervical spine: He continues to have discomfort in his C-spine.  Spondylosis of lumbar region without myelopathy or radiculopathy: Discomfort in his lower back persists.  Chronic bilateral shoulder pain: Continues to have discomfort in his bilateral shoulders and nocturnal pain. He has had cortisone injections in the past which last only short time. He will continue with the massage therapy for right now.  Trochanteric bursitis of right hip: ITB and exercise were discussed.  Myalgia: He continues to have generalized aches and pains in his  muscles.  Other fatigue: Related to his beta blocker use.  Other chronic pain: He does have severe arthritis in multiple joints which causes chronic pain. He is never increased his hydrocodone dose he is on same dose for multiple years which aches and functional. A prescription refill for hydrocodone was given today.  Paroxysmal atrial fibrillation [facility]): He has a pacemaker  Coronary artery disease with exertional angina ([place])  History of pacemaker  History of stroke/[place]   Long term current use of anticoagulant therapy  MGUS (monoclonal gammopathy of unknown significance)  PRIMARY HYPERCOAGULABLE STATE  S/P AVR  Vitamin D deficiency    Orders: No orders of the defined types were placed in this encounter.  No orders of the defined types were placed in this encounter.   Face-to-face time spent with patient was 25 minutes. 50% of time was spent in counseling and coordination of care.  Follow-Up Instructions: Return in about 6 months (around 03/31/2017) for Osteoarthritis.   Bo Merino, MD  Note - This record has been created using Editor, commissioning.  Chart creation  errors have been sought, but may not always  have been located. Such creation errors do not reflect on  the standard of medical care.

## 2016-09-28 NOTE — Telephone Encounter (Signed)
Patient to have his follow up appointment today while in office.

## 2016-09-28 NOTE — Telephone Encounter (Signed)
Opened in error

## 2016-09-29 ENCOUNTER — Encounter (HOSPITAL_COMMUNITY): Payer: Medicare Other

## 2016-10-01 ENCOUNTER — Encounter (HOSPITAL_COMMUNITY): Payer: Medicare Other

## 2016-10-04 ENCOUNTER — Encounter (HOSPITAL_COMMUNITY): Payer: Medicare Other

## 2016-10-06 ENCOUNTER — Encounter (HOSPITAL_COMMUNITY): Payer: Medicare Other

## 2016-10-08 ENCOUNTER — Encounter (HOSPITAL_COMMUNITY): Payer: Medicare Other

## 2016-10-11 ENCOUNTER — Encounter (HOSPITAL_COMMUNITY): Payer: Medicare Other

## 2016-10-11 ENCOUNTER — Encounter (HOSPITAL_COMMUNITY): Payer: Self-pay | Admitting: *Deleted

## 2016-10-11 NOTE — Progress Notes (Signed)
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  apixaban (ELIQUIS) 5 MG TABS tablet, Take 1 tablet (5 mg total) by mouth 2 (two) times daily., Disp: 180 tablet, Rfl: 3 .  bisacodyl (DULCOLAX) 5 MG EC tablet, Take 5 mg by mouth daily as needed (constipation). , Disp: , Rfl:  .  butalbital-acetaminophen-caffeine (FIORICET, ESGIC) 50-325-40 MG per tablet, Take 1 tablet by mouth 2 (two) times daily as needed (osteo-arthritis pain). , Disp: , Rfl:  .  Cholecalciferol (VITAMIN D3) 2000 units capsule, Take 1 capsule by mouth daily., Disp: , Rfl:  .  Coenzyme Q10 (COQ10) 100 MG CAPS, Take 100 mg by mouth daily., Disp: , Rfl:  .  Diclofenac Sodium 1.5 % SOLN, Take 1 application by mouth 2 (two) times daily as needed (Knee pain). , Disp: , Rfl:  .  doxazosin (CARDURA) 4 MG tablet, Take 4 mg by mouth at bedtime. , Disp: , Rfl:  .  HYDROcodone-acetaminophen (NORCO) 10-325 MG tablet, Take 1 tablet by mouth 2 (two) times daily as needed., Disp: 100 tablet, Rfl: 0 .  LORazepam (ATIVAN) 1 MG tablet, Take 1 mg by mouth at bedtime. , Disp: , Rfl:  .  magnesium oxide (MAG-OX) 400 MG tablet, Take 400 mg by mouth daily., Disp: , Rfl:  .  metoprolol succinate (TOPROL-XL) 100 MG 24 hr tablet, Take 1 tablet (100 mg total) by mouth 2 (two) times daily., Disp: 180 tablet, Rfl: 3 .  nitroGLYCERIN (NITROSTAT) 0.4 MG SL tablet, Place 1 tablet (0.4 mg total) under the tongue every 5 (five)  minutes as needed for chest pain., Disp: 25 tablet, Rfl: 3 .  OVER THE COUNTER MEDICATION, Apply 1 patch topically daily as needed (neck and shoulder pain). Chinese pain patch, Disp: , Rfl:  .  potassium chloride SA (K-DUR,KLOR-CON) 20 MEQ tablet, TAKE 1 TABLET BY MOUTH ONCE DAILY, Disp: 90 tablet, Rfl: 3 .  predniSONE (DELTASONE) 5 MG tablet, Take 2.5-5 mg by mouth daily as needed. , Disp: , Rfl:  .  rosuvastatin (CRESTOR) 20 MG tablet, TAKE 1 TABLET BY MOUTH AT BEDTIME., Disp: 90 tablet, Rfl: 2  Past Medical History: Past Medical History:  Diagnosis Date  . Aortic stenosis    moderate aortic stenosis  . Arthritis   . Benign prostatic hypertrophy   . 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  . Coronary artery disease    status post multiple prior percutaneous coronary interventions, microvascular angina per Dr Olevia Perches  . Heart murmur   . Hypercoagulable state ([place])    chronically anticoagulated with coumadin  . Hyperlipidemia   . Hyperthyroidism   . Hypothyroidism    Dr. Elyse Hsu  . MGUS (monoclonal gammopathy of unknown significance) 02/17/2013  . Paroxysmal atrial fibrillation ([place])    DR. Lia Foyer,   . Stroke [facility])  1990    Tobacco Use: History  Smoking Status  . Never Smoker  Smokeless Tobacco  . Never Used    Labs: Recent Review Flowsheet Data    Labs for ITP Cardiac and Pulmonary Rehab Latest Ref Rng & Units 11/26/2013 09/10/2014 07/02/2016 07/06/2016 07/06/2016   Cholestrol 100 - 199 mg/dL 150 259(H) 162 - -   LDLCALC 0 - 99 mg/dL 69 159(H) 67 - -   LDLDIRECT mg/dL - - - - -   HDL >39 mg/dL 59 77 71 - -   Trlycerides 0 - 149 mg/dL 112 113 120 - -   Hemoglobin A1c 4.8 - 5.6 % 5.8(H) - 5.7(H) - -   PHART 7.350 - 7.450 - - - - 7.373   PCO2ART 32.0 - 48.0 mmHg - - - - 42.0   HCO3 20.0 - 28.0 mmol/L - - - 25.8 24.5    TCO2 0 - 100 mmol/L - - - 27 26   ACIDBASEDEF 0.0 - 2.0 mmol/L - - - - 1.0   O2SAT % - - - 67.0 98.0      Capillary Blood Glucose: Lab Results  Component Value Date   GLUCAP 114 (H) 03/21/2011   GLUCAP 135 (H) 03/20/2011   GLUCAP 127 (H) 03/20/2011   GLUCAP 123 (H) 03/20/2011   GLUCAP 146 (H) 03/19/2011     Exercise Target Goals:    Exercise Program Goal: Individual exercise prescription set with THRR, safety & activity barriers. Participant demonstrates ability to understand and report RPE using BORG scale, to self-measure pulse accurately, and to acknowledge the importance of the exercise prescription.  Exercise Prescription Goal: Starting with aerobic activity 30 plus minutes a day, 3 days per week for initial exercise prescription. Provide home exercise prescription and guidelines that participant acknowledges understanding prior to discharge.  Activity Barriers & Risk Stratification:     Activity Barriers & Cardiac Risk Stratification - 08/26/16 1620      Activity Barriers & Cardiac Risk Stratification   Activity Barriers None   Cardiac Risk Stratification High      6 Minute Walk:     6 Minute Walk    Row Name 08/26/16 1544         6 Minute Walk   Phase Initial     Distance 1821 feet     Walk Time 6 minutes     # of Rest Breaks 0     MPH 3.4     METS 3.3     RPE 11     VO2 Peak 11.5     Symptoms No     Resting HR 73 bpm     Resting BP 142/80     Max Ex. HR 96 bpm     Max Ex. BP 148/88     2 Minute Post BP 118/78        Oxygen Initial Assessment:   Oxygen Re-Evaluation:   Oxygen Discharge (Final Oxygen Re-Evaluation):   Initial Exercise Prescription:     Initial Exercise Prescription - 08/26/16 1500      Date of Initial Exercise RX and Referring Provider   Date 08/26/16   Referring Provider Sherren Mocha MD     Treadmill   MPH 2.8   Grade 1   Minutes 10   METs 3     Recumbant Bike   Level 2   Minutes 10   METs 3      NuStep   Level 3   Minutes 10  METs 3     Prescription Details   Frequency (times per week) 3   Duration Progress to 45 minutes of aerobic exercise without signs/symptoms of physical distress     Intensity   THRR 40-80% of Max Heartrate 56-115   Ratings of Perceived Exertion 11-13   Perceived Dyspnea 0-4     Progression   Progression Continue to progress workloads to maintain intensity without signs/symptoms of physical distress.     Resistance Training   Training Prescription Yes   Weight 2   Reps 10-15      Perform Capillary Blood Glucose checks as needed.  Exercise Prescription Changes:      Exercise Prescription Changes    Row Name 09/03/16 1041 09/13/16 1000 09/27/16 1123         Response to Exercise   Blood Pressure (Admit) 112/82 98/62 112/66     Blood Pressure (Exercise) 130/70 119/78 128/72     Blood Pressure (Exit) 112/62 108/60 112/64     Heart Rate (Admit) 85 bpm 75 bpm 72 bpm     Heart Rate (Exercise) 105 bpm 102 bpm 96 bpm     Heart Rate (Exit) 85 bpm 75 bpm 72 bpm     Rating of Perceived Exertion (Exercise) 12 11 11      Symptoms none none  none     Comments pt was oriented to exercise equipment on 09/03/16  -  -     Duration Continue with 30 min of aerobic exercise without signs/symptoms of physical distress. Continue with 30 min of aerobic exercise without signs/symptoms of physical distress. Continue with 30 min of aerobic exercise without signs/symptoms of physical distress.     Intensity THRR unchanged THRR unchanged THRR unchanged       Progression   Progression Continue to progress workloads to maintain intensity without signs/symptoms of physical distress. Continue to progress workloads to maintain intensity without signs/symptoms of physical distress. Continue to progress workloads to maintain intensity without signs/symptoms of physical distress.     Average METs 3.4 2.9 3.6       Resistance Training   Training Prescription Yes No No      Weight 2lbs  -  -     Reps 10-15  -  -     Time 10 Minutes  -  -       Treadmill   MPH 2.8 2.8 2.6     Grade 1 1 2      Minutes 10 10 10      METs 3.53 3.53 3.71       Recumbant Bike   Level 2.5 2.5 3.5     Minutes 10 10 10        NuStep   Level 3 4 4      Minutes 10 10 10      METs 3.4 2.4 3.5       Home Exercise Plan   Plans to continue exercise at  -  - Home (comment)  home gym     Frequency  -  - Add 2 additional days to program exercise sessions.     Initial Home Exercises Provided  -  - 09/15/16        Exercise Comments:      Exercise Comments    Row Name 09/13/16 1044 09/15/16 1239 10/11/16 1657       Exercise Comments Pt was oriented to exercise equipment on 09/03/16. Pt is tolerating exercise fairly well; will continue to monitor exercise/activity progression. Reviewed HEP on  09/15/16. Reviewed METs and goals. Pt is tolerating exercise well; will continue to improve in cardiorespiratory fitness.        Exercise Goals and Review:      Exercise Goals    Row Name 08/26/16 1413             Exercise Goals   Increase Physical Activity Yes       Intervention Provide advice, education, support and counseling about physical activity/exercise needs.;Develop an individualized exercise prescription for aerobic and resistive training based on initial evaluation findings, risk stratification, comorbidities and participant's personal goals.       Expected Outcomes Achievement of increased cardiorespiratory fitness and enhanced flexibility, muscular endurance and strength shown through measurements of functional capacity and personal statement of participant.       Increase Strength and Stamina Yes       Intervention Provide advice, education, support and counseling about physical activity/exercise needs.;Develop an individualized exercise prescription for aerobic and resistive training based on initial evaluation findings, risk stratification, comorbidities and participant's  personal goals.       Expected Outcomes Achievement of increased cardiorespiratory fitness and enhanced flexibility, muscular endurance and strength shown through measurements of functional capacity and personal statement of participant.          Exercise Goals Re-Evaluation :     Exercise Goals Re-Evaluation    Row Name 09/15/16 1239 10/11/16 1654           Exercise Goal Re-Evaluation   Exercise Goals Review Increase Physical Activity;Increase Strenth and Stamina Increase Physical Activity;Increase Strenth and Stamina      Comments Reviewed home exercise with pt today.  Pt plans to go to [employer] for exercise,3x/week in addition to coming to cardiac rehab.  Reviewed THR, pulse, RPE, sign and symptoms, NTG use, and when to call 911 or MD.  Also discussed weather considerations and indoor options.  Pt voiced understanding. Pt is using home gym 2x/week in addition to coming to cardiac rehab. Pt is also walks the dog multiple times a day.      Expected Outcomes Pt will continue to improve in cardiorespiratory fitness and energy levels. Pt will continue to improve in cardiorespiratory fitness and energy levels.          Discharge Exercise Prescription (Final Exercise Prescription Changes):     Exercise Prescription Changes - 09/27/16 1123      Response to Exercise   Blood Pressure (Admit) 112/66   Blood Pressure (Exercise) 128/72   Blood Pressure (Exit) 112/64   Heart Rate (Admit) 72 bpm   Heart Rate (Exercise) 96 bpm   Heart Rate (Exit) 72 bpm   Rating of Perceived Exertion (Exercise) 11   Symptoms none   Duration Continue with 30 min of aerobic exercise without signs/symptoms of physical distress.   Intensity THRR unchanged     Progression   Progression Continue to progress workloads to maintain intensity without signs/symptoms of physical distress.   Average METs 3.6     Resistance Training   Training Prescription No     Treadmill   MPH 2.6   Grade 2   Minutes  10   METs 3.71     Recumbant Bike   Level 3.5   Minutes 10     NuStep   Level 4   Minutes 10   METs 3.5     Home Exercise Plan   Plans to continue exercise at Home (comment)  home gym   Frequency Add 2 additional  days to program exercise sessions.   Initial Home Exercises Provided 09/15/16      Nutrition:  Target Goals: Understanding of nutrition guidelines, daily intake of sodium 1500mg , cholesterol 200mg , calories 30% from fat and 7% or less from saturated fats, daily to have 5 or more servings of fruits and vegetables.  Biometrics:     Pre Biometrics - 08/26/16 1607      Pre Biometrics   Height 6\' 1"  (1.854 m)   Weight 231 lb 4.2 oz (104.9 kg)   Waist Circumference 43 inches   Hip Circumference 45.5 inches   Waist to Hip Ratio 0.95 %   BMI (Calculated) 30.6   Triceps Skinfold 15 mm   % Body Fat 29.8 %   Grip Strength 43 kg   Flexibility 7 in   Single Leg Stand 18.9 seconds       Nutrition Therapy Plan and Nutrition Goals:     Nutrition Therapy & Goals - 08/26/16 1546      Nutrition Therapy   Diet Therapeutic Lifestyle Changes     Personal Nutrition Goals   Nutrition Goal Wt loss of 1-2 lb/week to a wt loss goal of 6-24 lb at graduation from [place].      Intervention Plan   Intervention Prescribe, educate and counsel regarding individualized specific dietary modifications aiming towards targeted core components such as weight, hypertension, lipid management, diabetes, heart failure and other comorbidities.   Expected Outcomes Short Term Goal: Understand basic principles of dietary content, such as calories, fat, sodium, cholesterol and nutrients.;Long Term Goal: Adherence to prescribed nutrition plan.      Nutrition Discharge: Nutrition Scores:     Nutrition Assessments - 08/26/16 1545      MEDFICTS Scores   Pre Score 3      Nutrition Goals Re-Evaluation:   Nutrition Goals Re-Evaluation:   Nutrition Goals Discharge (Final  Nutrition Goals Re-Evaluation):   Psychosocial: Target Goals: Acknowledge presence or absence of significant depression and/or stress, maximize coping skills, provide positive support system. Participant is able to verbalize types and ability to use techniques and skills needed for reducing stress and depression.  Initial Review & Psychosocial Screening:     Initial Psych Review & Screening - 09/12/16 1923      Barriers   Psychosocial barriers to participate in program There are no identifiable barriers or psychosocial needs.     Screening Interventions   Interventions Encouraged to exercise      Quality of Life Scores:     Quality of Life - 08/26/16 1548      Quality of Life Scores   Health/Function Pre 18.57 %   Socioeconomic Pre 28.75 %   Psych/Spiritual Pre 25 %   Family Pre 22.8 %   GLOBAL Pre 22.55 %      PHQ-9: Recent Review Flowsheet Data    Depression screen [facility] 2/9 09/03/2016 04/12/2016 04/15/2015 11/26/2013   Decreased Interest 0 0 0 0   Down, Depressed, Hopeless 0 0 0 0   PHQ - 2 Score 0 0 0 0     Interpretation of Total Score  Total Score Depression Severity:  1-4 = Minimal depression, 5-9 = Mild depression, 10-14 = Moderate depression, 15-19 = Moderately severe depression, 20-27 = Severe depression   Psychosocial Evaluation and Intervention:     Psychosocial Evaluation - 10/11/16 1619      Psychosocial Evaluation & Interventions   Interventions Encouraged to exercise with the program and follow exercise prescription   Comments pt  demonstrates healthy and positive coping skills. pt has the support of his twin brother who is also a fellow participatnt in the cardiac rehab program   Continue Psychosocial Services  No Follow up required      Psychosocial Re-Evaluation:     Psychosocial Re-Evaluation    [place] Name 10/11/16 1619             Psychosocial Re-Evaluation   Current issues with None Identified       Interventions Encouraged to attend  Cardiac Rehabilitation for the exercise;Relaxation education;Stress management education       Continue Psychosocial Services  No Follow up required          Psychosocial Discharge (Final Psychosocial Re-Evaluation):     Psychosocial Re-Evaluation - 10/11/16 1619      Psychosocial Re-Evaluation   Current issues with None Identified   Interventions Encouraged to attend Cardiac Rehabilitation for the exercise;Relaxation education;Stress management education   Continue Psychosocial Services  No Follow up required      Vocational Rehabilitation: Provide vocational rehab assistance to qualifying candidates.   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 Participant will state understanding/return demonstration of topics presented.  Learning Barriers/Preferences:     Learning Barriers/Preferences - 08/26/16 1406      Learning Barriers/Preferences   Learning Barriers None   Learning Preferences Verbal Instruction;Written Material;Skilled Demonstration      Education Topics: Count Your Pulse:  -Group instruction provided by verbal instruction, demonstration, patient participation and written materials to support subject.  Instructors address importance of being able to find your pulse and how to count your pulse when at home without a heart monitor.  Patients get hands on experience counting their pulse with staff help and individually.   CARDIAC REHAB PHASE II EXERCISE from 09/17/2016 in [place]  Date  09/03/16  Educator  RN  Instruction Review Code  2- meets goals/outcomes      Heart Attack, Angina, and Risk Factor Modification:  -Group  instruction provided by verbal instruction, video, and written materials to support subject.  Instructors address signs and symptoms of angina and heart attacks.    Also discuss risk factors for heart disease and how to make changes to improve heart health risk factors.   Functional Fitness:  -Group instruction provided by verbal instruction, demonstration, patient participation, and written materials to support subject.  Instructors address safety measures for doing things around the house.  Discuss how to get up and down off the floor, how to pick things up properly, how to safely get out of a chair without assistance, and balance training.   CARDIAC REHAB PHASE II EXERCISE from 09/17/2016 in [place]  Date  09/17/16  Instruction Review Code  2- meets goals/outcomes      Meditation and Mindfulness:  -Group instruction provided by verbal instruction, patient participation, and written materials to support subject.  Instructor addresses importance of mindfulness and meditation practice to help reduce stress and improve awareness.  Instructor also leads participants through a meditation exercise.    Stretching for Flexibility and Mobility:  -Group instruction provided by verbal instruction, patient participation, and written materials to  support subject.  Instructors lead participants through series of stretches that are designed to increase flexibility thus improving mobility.  These stretches are additional exercise for major muscle groups that are typically performed during regular warm up and cool down.   Hands Only CPR:  -Group verbal, video, and participation provides a basic overview of AHA guidelines for community CPR. Role-play of emergencies allow participants the opportunity to practice calling for help and chest compression technique with discussion of AED use.   Hypertension: -Group verbal and written instruction that provides a basic overview of  hypertension including the most recent diagnostic guidelines, risk factor reduction with self-care instructions and medication management.    Nutrition I class: Heart Healthy Eating:  -Group instruction provided by PowerPoint slides, verbal discussion, and written materials to support subject matter. The instructor gives an explanation and review of the Therapeutic Lifestyle Changes diet recommendations, which includes a discussion on lipid goals, dietary fat, sodium, fiber, plant stanol/sterol esters, sugar, and the components of a well-balanced, healthy diet.   Nutrition II class: Lifestyle Skills:  -Group instruction provided by PowerPoint slides, verbal discussion, and written materials to support subject matter. The instructor gives an explanation and review of label reading, grocery shopping for heart health, heart healthy recipe modifications, and ways to make healthier choices when eating out.   Diabetes Question & Answer:  -Group instruction provided by PowerPoint slides, verbal discussion, and written materials to support subject matter. The instructor gives an explanation and review of diabetes co-morbidities, pre- and post-prandial blood glucose goals, pre-exercise blood glucose goals, signs, symptoms, and treatment of hypoglycemia and hyperglycemia, and foot care basics.   Diabetes Blitz:  -Group instruction provided by PowerPoint slides, verbal discussion, and written materials to support subject matter. The instructor gives an explanation and review of the physiology behind type 1 and type 2 diabetes, diabetes medications and rational behind using different medications, pre- and post-prandial blood glucose recommendations and Hemoglobin A1c goals, diabetes diet, and exercise including blood glucose guidelines for exercising safely.    Portion Distortion:  -Group instruction provided by PowerPoint slides, verbal discussion, written materials, and food models to support subject  matter. The instructor gives an explanation of serving size versus portion size, changes in portions sizes over the last 20 years, and what consists of a serving from each food group.   Stress Management:  -Group instruction provided by verbal instruction, video, and written materials to support subject matter.  Instructors review role of stress in heart disease and how to cope with stress positively.     Exercising on Your Own:  -Group instruction provided by verbal instruction, power point, and written materials to support subject.  Instructors discuss benefits of exercise, components of exercise, frequency and intensity of exercise, and end points for exercise.  Also discuss use of nitroglycerin and activating EMS.  Review options of places to exercise outside of rehab.  Review guidelines for sex with heart disease.   Cardiac Drugs I:  -Group instruction provided by verbal instruction and written materials to support subject.  Instructor reviews cardiac drug classes: antiplatelets, anticoagulants, beta blockers, and statins.  Instructor discusses reasons, side effects, and lifestyle considerations for each drug class.   Cardiac Drugs II:  -Group instruction provided by verbal instruction and written materials to support subject.  Instructor reviews cardiac drug classes: angiotensin converting enzyme inhibitors (ACE-I), angiotensin II receptor blockers (ARBs), nitrates, and calcium channel blockers.  Instructor discusses reasons, side effects, and lifestyle considerations for each drug class.  Anatomy and Physiology of the Circulatory System:  Group verbal and written instruction and models provide basic cardiac anatomy and physiology, with the coronary electrical and arterial systems. Review of: AMI, Angina, Valve disease, Heart Failure, Peripheral Artery Disease, Cardiac Arrhythmia, Pacemakers, and the ICD.   Other Education:  -Group or individual verbal, written, or video instructions  that support the educational goals of the cardiac rehab program.   Knowledge Questionnaire Score:     Knowledge Questionnaire Score - 08/26/16 1543      Knowledge Questionnaire Score   Pre Score 22/24      Core Components/Risk Factors/Patient Goals at Admission:     Personal Goals and Risk Factors at Admission - 08/26/16 1419      Core Components/Risk Factors/Patient Goals on Admission    Weight Management Yes;Obesity;Weight Maintenance;Weight Loss   Intervention Weight Management: Develop a combined nutrition and exercise program designed to reach desired caloric intake, while maintaining appropriate intake of nutrient and fiber, sodium and fats, and appropriate energy expenditure required for the weight goal.;Weight Management: Provide education and appropriate resources to help participant work on and attain dietary goals.;Weight Management/Obesity: Establish reasonable short term and long term weight goals.;Obesity: Provide education and appropriate resources to help participant work on and attain dietary goals.   Expected Outcomes Short Term: Continue to assess and modify interventions until short term weight is achieved;Long Term: Adherence to nutrition and physical activity/exercise program aimed toward attainment of established weight goal;Weight Maintenance: Understanding of the daily nutrition guidelines, which includes 25-35% calories from fat, 7% or less cal from saturated fats, less than 200mg  cholesterol, less than 1.5gm of sodium, & 5 or more servings of fruits and vegetables daily;Weight Loss: Understanding of general recommendations for a balanced deficit meal plan, which promotes 1-2 lb weight loss per week and includes a negative energy balance of [phone] kcal/d;Understanding recommendations for meals to include 15-35% energy as protein, 25-35% energy from fat, 35-60% energy from carbohydrates, less than 200mg  of dietary cholesterol, 20-35 gm of total fiber  daily;Understanding of distribution of calorie intake throughout the day with the consumption of 4-5 meals/snacks   Hypertension Yes   Intervention Provide education on lifestyle modifcations including regular physical activity/exercise, weight management, moderate sodium restriction and increased consumption of fresh fruit, vegetables, and low fat dairy, alcohol moderation, and smoking cessation.;Monitor prescription use compliance.   Expected Outcomes Short Term: Continued assessment and intervention until BP is < 140/50mm HG in hypertensive participants. < 130/60mm HG in hypertensive participants with diabetes, heart failure or chronic kidney disease.;Long Term: Maintenance of blood pressure at goal levels.   Lipids Yes   Intervention Provide education and support for participant on nutrition & aerobic/resistive exercise along with prescribed medications to achieve LDL 70mg , HDL >40mg .   Expected Outcomes Short Term: Participant states understanding of desired cholesterol values and is compliant with medications prescribed. Participant is following exercise prescription and nutrition guidelines.;Long Term: Cholesterol controlled with medications as prescribed, with individualized exercise RX and with personalized nutrition plan. Value goals: LDL < 70mg , HDL > 40 mg.      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  is a retired Engineer, drilling and is off to a good start toward achieving risk factor reduction      Expected Outcomes Pt achieves and/or make progress toward his desired weight, improvement of blood lipid levels and bp readings  within normal limits Pt achieves and/or make progress toward his desired weight, improvement of blood lipid levels and bp readings within normal limits         Core Components/Risk Factors/Patient Goals at Discharge (Final Review):      Goals and Risk Factor Review - 10/11/16 1619      Core Components/Risk Factors/Patient Goals Review   Personal Goals Review Weight Management/Obesity;Hypertension;Lipids   Review Pt is a retired Engineer, 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 Fransico Him, Medical Director          Comments:  Gene is making expected progress toward personal goals after completing 12 sessions. Pt [place] vacation[place] with [place]. Psychosocial Assessment -Pt exhibits positive coping skills, hopeful outlook with supportive family. No psychosocial needs identified at this time, no psychosocial interventions necessary. Recommend continued exercise and life style modification education including  stress management and relaxation techniques to decrease cardiac risk profile. Cherre Huger, BSN Cardiac and Training and development officer

## 2016-10-12 ENCOUNTER — Other Ambulatory Visit: Payer: Self-pay | Admitting: Cardiovascular Disease

## 2016-10-12 DIAGNOSIS — Z4802 Encounter for removal of sutures: Secondary | ICD-10-CM | POA: Diagnosis not present

## 2016-10-12 MED FILL — METOPROLOL SUCC ER 100 MG T: 100 | 90 days supply | Qty: 180 | Fill #3

## 2016-10-12 MED FILL — LORazepam 1 MG TABS: 1 | 60 days supply | Qty: 60 | Fill #0

## 2016-10-13 ENCOUNTER — Encounter (HOSPITAL_COMMUNITY)
Admission: RE | Admit: 2016-10-13 | Discharge: 2016-10-13 | Disposition: A | Discharge status: Home / Self Care | Payer: Medicare Other | Source: Ambulatory Visit | Attending: Cardiovascular Disease | Admitting: Cardiovascular Disease

## 2016-10-13 DIAGNOSIS — I498 Other specified cardiac arrhythmias: Secondary | ICD-10-CM | POA: Insufficient documentation

## 2016-10-13 DIAGNOSIS — N400 Enlarged prostate without lower urinary tract symptoms: Secondary | ICD-10-CM | POA: Insufficient documentation

## 2016-10-13 DIAGNOSIS — I480 Paroxysmal atrial fibrillation: Secondary | ICD-10-CM | POA: Insufficient documentation

## 2016-10-13 DIAGNOSIS — I350 Nonrheumatic aortic (valve) stenosis: Secondary | ICD-10-CM | POA: Insufficient documentation

## 2016-10-13 DIAGNOSIS — Z7901 Long term (current) use of anticoagulants: Secondary | ICD-10-CM | POA: Insufficient documentation

## 2016-10-13 DIAGNOSIS — M1990 Unspecified osteoarthritis, unspecified site: Secondary | ICD-10-CM | POA: Insufficient documentation

## 2016-10-13 DIAGNOSIS — I208 Other forms of angina pectoris: Secondary | ICD-10-CM | POA: Insufficient documentation

## 2016-10-13 DIAGNOSIS — Z8673 Personal history of transient ischemic attack (TIA), and cerebral infarction without residual deficits: Secondary | ICD-10-CM | POA: Insufficient documentation

## 2016-10-13 DIAGNOSIS — R011 Cardiac murmur, unspecified: Secondary | ICD-10-CM | POA: Insufficient documentation

## 2016-10-13 DIAGNOSIS — Z79899 Other long term (current) drug therapy: Secondary | ICD-10-CM | POA: Insufficient documentation

## 2016-10-15 ENCOUNTER — Encounter (HOSPITAL_COMMUNITY): Admission: RE | Admit: 2016-10-15 | Payer: Medicare Other | Source: Ambulatory Visit

## 2016-10-15 DIAGNOSIS — H5202 Hypermetropia, left eye: Secondary | ICD-10-CM | POA: Diagnosis not present

## 2016-10-15 DIAGNOSIS — H5211 Myopia, right eye: Secondary | ICD-10-CM | POA: Diagnosis not present

## 2016-10-15 DIAGNOSIS — H2513 Age-related nuclear cataract, bilateral: Secondary | ICD-10-CM | POA: Diagnosis not present

## 2016-10-15 DIAGNOSIS — H52223 Regular astigmatism, bilateral: Secondary | ICD-10-CM | POA: Diagnosis not present

## 2016-10-15 DIAGNOSIS — H524 Presbyopia: Secondary | ICD-10-CM | POA: Diagnosis not present

## 2016-10-15 DIAGNOSIS — G7000 Myasthenia gravis without (acute) exacerbation: Secondary | ICD-10-CM | POA: Diagnosis not present

## 2016-10-18 ENCOUNTER — Encounter (HOSPITAL_COMMUNITY)
Admission: RE | Admit: 2016-10-18 | Discharge: 2016-10-18 | Disposition: A | Discharge status: Home / Self Care | Payer: Medicare Other | Source: Ambulatory Visit | Attending: Cardiovascular Disease | Admitting: Cardiovascular Disease

## 2016-10-18 DIAGNOSIS — I350 Nonrheumatic aortic (valve) stenosis: Secondary | ICD-10-CM | POA: Diagnosis not present

## 2016-10-18 DIAGNOSIS — Z79899 Other long term (current) drug therapy: Secondary | ICD-10-CM | POA: Diagnosis not present

## 2016-10-18 DIAGNOSIS — I498 Other specified cardiac arrhythmias: Secondary | ICD-10-CM | POA: Diagnosis not present

## 2016-10-18 DIAGNOSIS — I208 Other forms of angina pectoris: Secondary | ICD-10-CM | POA: Diagnosis not present

## 2016-10-18 DIAGNOSIS — I480 Paroxysmal atrial fibrillation: Secondary | ICD-10-CM | POA: Diagnosis not present

## 2016-10-18 DIAGNOSIS — R011 Cardiac murmur, unspecified: Secondary | ICD-10-CM | POA: Diagnosis not present

## 2016-10-18 DIAGNOSIS — M1990 Unspecified osteoarthritis, unspecified site: Secondary | ICD-10-CM | POA: Diagnosis not present

## 2016-10-18 DIAGNOSIS — Z7901 Long term (current) use of anticoagulants: Secondary | ICD-10-CM | POA: Diagnosis not present

## 2016-10-18 DIAGNOSIS — N400 Enlarged prostate without lower urinary tract symptoms: Secondary | ICD-10-CM | POA: Diagnosis not present

## 2016-10-18 DIAGNOSIS — Z8673 Personal history of transient ischemic attack (TIA), and cerebral infarction without residual deficits: Secondary | ICD-10-CM | POA: Diagnosis not present

## 2016-10-20 ENCOUNTER — Encounter (HOSPITAL_COMMUNITY)
Admission: RE | Admit: 2016-10-20 | Discharge: 2016-10-20 | Disposition: A | Discharge status: Home / Self Care | Payer: Medicare Other | Source: Ambulatory Visit | Attending: Cardiovascular Disease | Admitting: Cardiovascular Disease

## 2016-10-20 ENCOUNTER — Encounter (HOSPITAL_COMMUNITY): Payer: Medicare Other

## 2016-10-20 DIAGNOSIS — I208 Other forms of angina pectoris: Secondary | ICD-10-CM | POA: Diagnosis not present

## 2016-10-20 DIAGNOSIS — Z79899 Other long term (current) drug therapy: Secondary | ICD-10-CM | POA: Diagnosis not present

## 2016-10-20 DIAGNOSIS — M1990 Unspecified osteoarthritis, unspecified site: Secondary | ICD-10-CM | POA: Diagnosis not present

## 2016-10-20 DIAGNOSIS — Z7901 Long term (current) use of anticoagulants: Secondary | ICD-10-CM | POA: Diagnosis not present

## 2016-10-20 DIAGNOSIS — N400 Enlarged prostate without lower urinary tract symptoms: Secondary | ICD-10-CM | POA: Diagnosis not present

## 2016-10-20 DIAGNOSIS — I350 Nonrheumatic aortic (valve) stenosis: Secondary | ICD-10-CM | POA: Diagnosis not present

## 2016-10-20 MED FILL — predniSONE 10 MG TABS: 10 | 30 days supply | Qty: 30 | Fill #5

## 2016-10-20 MED FILL — BUTALB-ACETAMIN-CAFF 50-325: 50-325-40 | 90 days supply | Qty: 180 | Fill #0

## 2016-10-22 ENCOUNTER — Encounter (HOSPITAL_COMMUNITY)
Admission: RE | Admit: 2016-10-22 | Discharge: 2016-10-22 | Disposition: A | Discharge status: Home / Self Care | Payer: Medicare Other | Source: Ambulatory Visit | Attending: Cardiovascular Disease | Admitting: Cardiovascular Disease

## 2016-10-22 ENCOUNTER — Encounter (HOSPITAL_COMMUNITY): Payer: Medicare Other

## 2016-10-22 DIAGNOSIS — Z79899 Other long term (current) drug therapy: Secondary | ICD-10-CM | POA: Diagnosis not present

## 2016-10-22 DIAGNOSIS — I208 Other forms of angina pectoris: Secondary | ICD-10-CM

## 2016-10-22 DIAGNOSIS — N400 Enlarged prostate without lower urinary tract symptoms: Secondary | ICD-10-CM | POA: Diagnosis not present

## 2016-10-22 DIAGNOSIS — I350 Nonrheumatic aortic (valve) stenosis: Secondary | ICD-10-CM | POA: Diagnosis not present

## 2016-10-22 DIAGNOSIS — Z7901 Long term (current) use of anticoagulants: Secondary | ICD-10-CM | POA: Diagnosis not present

## 2016-10-22 DIAGNOSIS — M1990 Unspecified osteoarthritis, unspecified site: Secondary | ICD-10-CM | POA: Diagnosis not present

## 2016-10-25 ENCOUNTER — Encounter (HOSPITAL_COMMUNITY)
Admission: RE | Admit: 2016-10-25 | Discharge: 2016-10-25 | Disposition: A | Discharge status: Home / Self Care | Payer: Medicare Other | Source: Ambulatory Visit | Attending: Cardiovascular Disease | Admitting: Cardiovascular Disease

## 2016-10-25 ENCOUNTER — Encounter (HOSPITAL_COMMUNITY): Payer: Medicare Other

## 2016-10-25 DIAGNOSIS — Z7901 Long term (current) use of anticoagulants: Secondary | ICD-10-CM | POA: Diagnosis not present

## 2016-10-25 DIAGNOSIS — I208 Other forms of angina pectoris: Secondary | ICD-10-CM

## 2016-10-25 DIAGNOSIS — N400 Enlarged prostate without lower urinary tract symptoms: Secondary | ICD-10-CM | POA: Diagnosis not present

## 2016-10-25 DIAGNOSIS — M1990 Unspecified osteoarthritis, unspecified site: Secondary | ICD-10-CM | POA: Diagnosis not present

## 2016-10-25 DIAGNOSIS — Z79899 Other long term (current) drug therapy: Secondary | ICD-10-CM | POA: Diagnosis not present

## 2016-10-25 DIAGNOSIS — I350 Nonrheumatic aortic (valve) stenosis: Secondary | ICD-10-CM | POA: Diagnosis not present

## 2016-10-27 ENCOUNTER — Encounter (HOSPITAL_COMMUNITY): Payer: Medicare Other

## 2016-10-27 DIAGNOSIS — M19012 Primary osteoarthritis, left shoulder: Secondary | ICD-10-CM | POA: Diagnosis not present

## 2016-10-27 DIAGNOSIS — M19011 Primary osteoarthritis, right shoulder: Secondary | ICD-10-CM | POA: Diagnosis not present

## 2016-10-29 ENCOUNTER — Encounter (HOSPITAL_COMMUNITY): Payer: Medicare Other

## 2016-11-03 ENCOUNTER — Encounter (HOSPITAL_COMMUNITY): Payer: Medicare Other

## 2016-11-05 ENCOUNTER — Encounter (HOSPITAL_COMMUNITY): Payer: Medicare Other

## 2016-11-08 ENCOUNTER — Encounter (HOSPITAL_COMMUNITY): Payer: Medicare Other

## 2016-11-09 ENCOUNTER — Ambulatory Visit (HOSPITAL_BASED_OUTPATIENT_CLINIC_OR_DEPARTMENT_OTHER): Payer: Medicare Other | Attending: Cardiovascular Disease | Admitting: Cardiovascular Disease

## 2016-11-09 VITALS — Ht 73.0 in | Wt 225.0 lb

## 2016-11-09 DIAGNOSIS — R5383 Other fatigue: Secondary | ICD-10-CM | POA: Insufficient documentation

## 2016-11-09 DIAGNOSIS — I480 Paroxysmal atrial fibrillation: Secondary | ICD-10-CM | POA: Diagnosis not present

## 2016-11-09 DIAGNOSIS — R51 Headache: Secondary | ICD-10-CM | POA: Insufficient documentation

## 2016-11-09 DIAGNOSIS — I4892 Unspecified atrial flutter: Secondary | ICD-10-CM | POA: Diagnosis not present

## 2016-11-09 DIAGNOSIS — Z79899 Other long term (current) drug therapy: Secondary | ICD-10-CM | POA: Insufficient documentation

## 2016-11-09 DIAGNOSIS — R0683 Snoring: Secondary | ICD-10-CM | POA: Diagnosis not present

## 2016-11-09 DIAGNOSIS — G4719 Other hypersomnia: Secondary | ICD-10-CM | POA: Diagnosis not present

## 2016-11-09 DIAGNOSIS — Z7901 Long term (current) use of anticoagulants: Secondary | ICD-10-CM | POA: Diagnosis not present

## 2016-11-09 DIAGNOSIS — I493 Ventricular premature depolarization: Secondary | ICD-10-CM | POA: Insufficient documentation

## 2016-11-09 DIAGNOSIS — Z7952 Long term (current) use of systemic steroids: Secondary | ICD-10-CM | POA: Diagnosis not present

## 2016-11-09 DIAGNOSIS — G478 Other sleep disorders: Secondary | ICD-10-CM

## 2016-11-09 DIAGNOSIS — R400 Somnolence: Secondary | ICD-10-CM

## 2016-11-09 DIAGNOSIS — I2510 Atherosclerotic heart disease of native coronary artery without angina pectoris: Secondary | ICD-10-CM

## 2016-11-10 ENCOUNTER — Encounter (HOSPITAL_COMMUNITY): Payer: Medicare Other

## 2016-11-10 NOTE — Progress Notes (Signed)
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  apixaban (ELIQUIS) 5 MG TABS tablet, Take 1 tablet (5 mg total) by mouth 2 (two) times daily., Disp: 180 tablet, Rfl: 3 .  bisacodyl (DULCOLAX) 5 MG EC tablet, Take 5 mg by mouth daily as needed (constipation). , Disp: , Rfl:  .  butalbital-acetaminophen-caffeine (FIORICET, ESGIC) 50-325-40 MG per tablet, Take 1 tablet by mouth 2 (two) times daily as needed (osteo-arthritis pain). , Disp: , Rfl:  .  Cholecalciferol (VITAMIN D3) 2000 units capsule, Take 1 capsule by mouth daily., Disp: , Rfl:  .  Coenzyme Q10 (COQ10) 100 MG CAPS, Take 100 mg by mouth daily., Disp: , Rfl:  .  Diclofenac Sodium 1.5 % SOLN, Take 1 application by mouth 2 (two) times daily as needed (Knee pain). , Disp: , Rfl:  .  doxazosin (CARDURA) 4 MG tablet, Take 4 mg by mouth at bedtime. , Disp: , Rfl:  .  HYDROcodone-acetaminophen (NORCO) 10-325 MG tablet, Take 1 tablet by mouth 2 (two) times daily as needed., Disp: 100 tablet, Rfl: 0 .  LORazepam (ATIVAN) 1 MG tablet, Take 1 mg by mouth at bedtime. , Disp: , Rfl:  .  magnesium oxide (MAG-OX) 400 MG tablet, Take 400 mg by mouth daily., Disp: , Rfl:  .  metoprolol succinate (TOPROL-XL) 100 MG 24 hr tablet, Take 1 tablet (100 mg total) by mouth 2 (two) times daily., Disp: 180 tablet, Rfl: 3 .  nitroGLYCERIN (NITROSTAT) 0.4 MG SL tablet, Place 1 tablet (0.4 mg total) under the tongue every 5  (five) minutes as needed for chest pain., Disp: 25 tablet, Rfl: 3 .  OVER THE COUNTER MEDICATION, Apply 1 patch topically daily as needed (neck and shoulder pain). Chinese pain patch, Disp: , Rfl:  .  potassium chloride SA (K-DUR,KLOR-CON) 20 MEQ tablet, TAKE 1 TABLET BY MOUTH ONCE DAILY, Disp: 90 tablet, Rfl: 3 .  predniSONE (DELTASONE) 5 MG tablet, Take 2.5-5 mg by mouth daily as needed. , Disp: , Rfl:  .  rosuvastatin (CRESTOR) 20 MG tablet, TAKE 1 TABLET BY MOUTH AT BEDTIME., Disp: 90 tablet, Rfl: 2  Past Medical History: Past Medical History:  Diagnosis Date  . Aortic stenosis    moderate aortic stenosis  . Arthritis   . Benign prostatic hypertrophy   . 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  . Coronary artery disease    status post multiple prior percutaneous coronary interventions, microvascular angina per Dr Olevia Perches  . Heart murmur   . Hypercoagulable state ([place])    chronically anticoagulated with coumadin  . Hyperlipidemia   . Hyperthyroidism   . Hypothyroidism    Dr. Elyse Hsu  . MGUS (monoclonal gammopathy of unknown significance) 02/17/2013  . Paroxysmal atrial fibrillation ([place])    DR. Lia Foyer,   . Stroke [facility])  1990    Tobacco Use: History  Smoking Status  . Never Smoker  Smokeless Tobacco  . Never Used    Labs: Recent Review Flowsheet Data    Labs for ITP Cardiac and Pulmonary Rehab Latest Ref Rng & Units 11/26/2013 09/10/2014 07/02/2016 07/06/2016 07/06/2016   Cholestrol 100 - 199 mg/dL 150 259(H) 162 - -   LDLCALC 0 - 99 mg/dL 69 159(H) 67 - -   LDLDIRECT mg/dL - - - - -   HDL >39 mg/dL 59 77 71 - -   Trlycerides 0 - 149 mg/dL 112 113 120 - -   Hemoglobin A1c 4.8 - 5.6 % 5.8(H) - 5.7(H) - -   PHART 7.350 - 7.450 - - - - 7.373   PCO2ART 32.0 - 48.0 mmHg - - - - 42.0   HCO3 20.0 - 28.0 mmol/L - - - 25.8  24.5   TCO2 0 - 100 mmol/L - - - 27 26   ACIDBASEDEF 0.0 - 2.0 mmol/L - - - - 1.0   O2SAT % - - - 67.0 98.0      Capillary Blood Glucose: Lab Results  Component Value Date   GLUCAP 114 (H) 03/21/2011   GLUCAP 135 (H) 03/20/2011   GLUCAP 127 (H) 03/20/2011   GLUCAP 123 (H) 03/20/2011   GLUCAP 146 (H) 03/19/2011     Exercise Target Goals:    Exercise Program Goal: Individual exercise prescription set with THRR, safety & activity barriers. Participant demonstrates ability to understand and report RPE using BORG scale, to self-measure pulse accurately, and to acknowledge the importance of the exercise prescription.  Exercise Prescription Goal: Starting with aerobic activity 30 plus minutes a day, 3 days per week for initial exercise prescription. Provide home exercise prescription and guidelines that participant acknowledges understanding prior to discharge.  Activity Barriers & Risk Stratification:     Activity Barriers & Cardiac Risk Stratification - 08/26/16 1620      Activity Barriers & Cardiac Risk Stratification   Activity Barriers None   Cardiac Risk Stratification High      6 Minute Walk:     6 Minute Walk    Row Name 08/26/16 1544         6 Minute Walk   Phase Initial     Distance 1821 feet     Walk Time 6 minutes     # of Rest Breaks 0     MPH 3.4     METS 3.3     RPE 11     VO2 Peak 11.5     Symptoms No     Resting HR 73 bpm     Resting BP 142/80     Max Ex. HR 96 bpm     Max Ex. BP 148/88     2 Minute Post BP 118/78        Oxygen Initial Assessment:   Oxygen Re-Evaluation:   Oxygen Discharge (Final Oxygen Re-Evaluation):   Initial Exercise Prescription:     Initial Exercise Prescription - 08/26/16 1500      Date of Initial Exercise RX and Referring Provider   Date 08/26/16   Referring Provider Cooper, Michael MD     Treadmill   MPH 2.8   Grade 1   Minutes 10   METs 3     Recumbant Bike   Level 2   Minutes 10   METs 3      NuStep   Level 3   Minutes 10     METs 3     Prescription Details   Frequency (times per week) 3   Duration Progress to 45 minutes of aerobic exercise without signs/symptoms of physical distress     Intensity   THRR 40-80% of Max Heartrate 56-115   Ratings of Perceived Exertion 11-13   Perceived Dyspnea 0-4     Progression   Progression Continue to progress workloads to maintain intensity without signs/symptoms of physical distress.     Resistance Training   Training Prescription Yes   Weight 2   Reps 10-15      Perform Capillary Blood Glucose checks as needed.  Exercise Prescription Changes:     Exercise Prescription Changes    Row Name 09/03/16 1041 09/13/16 1000 09/27/16 1123 10/18/16 1427 10/25/16 1423     Response to Exercise   Blood Pressure (Admit) 112/82 98/62 112/66 104/66 118/70   Blood Pressure (Exercise) 130/70 119/78 128/72 122/70 120/60   Blood Pressure (Exit) 112/62 108/60 112/64 104/60 97/66   Heart Rate (Admit) 85 bpm 75 bpm 72 bpm 78 bpm 80 bpm   Heart Rate (Exercise) 105 bpm 102 bpm 96 bpm 99 bpm 115 bpm   Heart Rate (Exit) 85 bpm 75 bpm 72 bpm 88 bpm 80 bpm   Rating of Perceived Exertion (Exercise) 12 11 11 11 11   Symptoms none none  none none none   Comments pt was oriented to exercise equipment on 09/03/16  -  -  -  -   Duration Continue with 30 min of aerobic exercise without signs/symptoms of physical distress. Continue with 30 min of aerobic exercise without signs/symptoms of physical distress. Continue with 30 min of aerobic exercise without signs/symptoms of physical distress. Continue with 30 min of aerobic exercise without signs/symptoms of physical distress. Continue with 30 min of aerobic exercise without signs/symptoms of physical distress.   Intensity THRR unchanged THRR unchanged THRR unchanged THRR unchanged THRR unchanged     Progression   Progression Continue to progress workloads to maintain intensity without signs/symptoms of  physical distress. Continue to progress workloads to maintain intensity without signs/symptoms of physical distress. Continue to progress workloads to maintain intensity without signs/symptoms of physical distress. Continue to progress workloads to maintain intensity without signs/symptoms of physical distress. Continue to progress workloads to maintain intensity without signs/symptoms of physical distress.   Average METs 3.4 2.9 3.6 3.7 4.1     Resistance Training   Training Prescription Yes No No No No   Weight 2lbs  -  -  -  -   Reps 10-15  -  -  -  -   Time 10 Minutes  -  -  -  -     Treadmill   MPH 2.8 2.8 2.6 2.6 2.6   Grade 1 1 2 2 2   Minutes 10 10 10 10 10   METs 3.53 3.53 3.71 3.71 3.71     Recumbant Bike   Level 2.5 2.5 3.5 3.5 4   Minutes 10 10 10 10 10     NuStep   Level 3 4 4 4 4   Minutes 10 10 10 10 10   METs 3.4 2.4 3.5 3.8 4.6     Home Exercise Plan   Plans to continue exercise at  -  - Home (comment)  home gym Home (comment)  home gym Home (comment)  home gym   Frequency  -  - Add 2 additional days to program exercise sessions. Add 2 additional days   to program exercise sessions. Add 2 additional days to program exercise sessions.   Initial Home Exercises Provided  -  - 09/15/16 09/15/16 09/15/16      Exercise Comments:     Exercise Comments    Row Name 09/13/16 1044 09/15/16 1239 10/11/16 1657 11/09/16 1425     Exercise Comments Pt was oriented to exercise equipment on 09/03/16. Pt is tolerating exercise fairly well; will continue to monitor exercise/activity progression. Reviewed HEP on 09/15/16. Reviewed METs and goals. Pt is tolerating exercise well; will continue to improve in cardiorespiratory fitness. Unable to access METs and goals. Pt last exercise session was 10/25/16. Will f/u       Exercise Goals and Review:     Exercise Goals    Row Name 08/26/16 1413             Exercise Goals   Increase Physical Activity Yes       Intervention  Provide advice, education, support and counseling about physical activity/exercise needs.;Develop an individualized exercise prescription for aerobic and resistive training based on initial evaluation findings, risk stratification, comorbidities and participant's personal goals.       Expected Outcomes Achievement of increased cardiorespiratory fitness and enhanced flexibility, muscular endurance and strength shown through measurements of functional capacity and personal statement of participant.       Increase Strength and Stamina Yes       Intervention Provide advice, education, support and counseling about physical activity/exercise needs.;Develop an individualized exercise prescription for aerobic and resistive training based on initial evaluation findings, risk stratification, comorbidities and participant's personal goals.       Expected Outcomes Achievement of increased cardiorespiratory fitness and enhanced flexibility, muscular endurance and strength shown through measurements of functional capacity and personal statement of participant.          Exercise Goals Re-Evaluation :     Exercise Goals Re-Evaluation    Row Name 09/15/16 1239 10/11/16 1654 11/09/16 1426         Exercise Goal Re-Evaluation   Exercise Goals Review Increase Physical Activity;Increase Strenth and Stamina Increase Physical Activity;Increase Strenth and Stamina Increase Physical Activity;Able to understand and use rate of perceived exertion (RPE) scale;Knowledge and understanding of Target Heart Rate Range (THRR);Understanding of Exercise Prescription;Increase Strength and Stamina;Able to check pulse independently     Comments Reviewed home exercise with pt today.  Pt plans to go to [facility] for exercise,3x/week in addition to coming to cardiac rehab.  Reviewed THR, pulse, RPE, sign and symptoms, NTG use, and when to call 911 or MD.  Also discussed weather considerations and indoor options.  Pt voiced  understanding. Pt is using home gym 2x/week in addition to coming to cardiac rehab. Pt is also walks the dog multiple times a day. Pt is progressing well in cardiac rehab. Pt MET levels are increasing and evident that pt is getting stronger.     Expected Outcomes Pt will continue to improve in cardiorespiratory fitness and energy levels. Pt will continue to improve in cardiorespiratory fitness and energy levels. Pt will continue to improve in cardiorespiratory fitness and energy levels.         Discharge Exercise Prescription (Final Exercise Prescription Changes):     Exercise Prescription Changes - 10/25/16 1423      Response to Exercise   Blood Pressure (Admit) 118/70   Blood Pressure (Exercise) 120/60   Blood Pressure (Exit) 97/66   Heart Rate (Admit) 80 bpm   Heart Rate (Exercise) 115 bpm     Heart Rate (Exit) 80 bpm   Rating of Perceived Exertion (Exercise) 11   Symptoms none   Duration Continue with 30 min of aerobic exercise without signs/symptoms of physical distress.   Intensity THRR unchanged     Progression   Progression Continue to progress workloads to maintain intensity without signs/symptoms of physical distress.   Average METs 4.1     Resistance Training   Training Prescription No     Treadmill   MPH 2.6   Grade 2   Minutes 10   METs 3.71     Recumbant Bike   Level 4   Minutes 10     NuStep   Level 4   Minutes 10   METs 4.6     Home Exercise Plan   Plans to continue exercise at Home (comment)  home gym   Frequency Add 2 additional days to program exercise sessions.   Initial Home Exercises Provided 09/15/16      Nutrition:  Target Goals: Understanding of nutrition guidelines, daily intake of sodium <1542m, cholesterol <2072m calories 30% from fat and 7% or less from saturated fats, daily to have 5 or more servings of fruits and vegetables.  Biometrics:     Pre Biometrics - 08/26/16 1607      Pre Biometrics   Height 6' 1" (1.854 m)    Weight 231 lb 4.2 oz (104.9 kg)   Waist Circumference 43 inches   Hip Circumference 45.5 inches   Waist to Hip Ratio 0.95 %   BMI (Calculated) 30.6   Triceps Skinfold 15 mm   % Body Fat 29.8 %   Grip Strength 43 kg   Flexibility 7 in   Single Leg Stand 18.9 seconds       Nutrition Therapy Plan and Nutrition Goals:     Nutrition Therapy & Goals - 08/26/16 1546      Nutrition Therapy   Diet Therapeutic Lifestyle Changes     Personal Nutrition Goals   Nutrition Goal Wt loss of 1-2 lb/week to a wt loss goal of 6-24 lb at graduation from [facility]     Intervention Plan   Intervention Prescribe, educate and counsel regarding individualized specific dietary modifications aiming towards targeted core components such as weight, hypertension, lipid management, diabetes, heart failure and other comorbidities.   Expected Outcomes Short Term Goal: Understand basic principles of dietary content, such as calories, fat, sodium, cholesterol and nutrients.;Long Term Goal: Adherence to prescribed nutrition plan.      Nutrition Discharge: Nutrition Scores:     Nutrition Assessments - 08/26/16 1545      MEDFICTS Scores   Pre Score 3      Nutrition Goals Re-Evaluation:   Nutrition Goals Re-Evaluation:   Nutrition Goals Discharge (Final Nutrition Goals Re-Evaluation):   Psychosocial: Target Goals: Acknowledge presence or absence of significant depression and/or stress, maximize coping skills, provide positive support system. Participant is able to verbalize types and ability to use techniques and skills needed for reducing stress and depression.  Initial Review & Psychosocial Screening:     Initial Psych Review & Screening - 09/12/16 1923      Barriers   Psychosocial barriers to participate in program There are no identifiable barriers or psychosocial needs.     Screening Interventions   Interventions Encouraged to exercise      Quality of Life Scores:     Quality  of Life - 08/26/16 1548      Quality of Life Scores   Health/Function  Pre 18.57 %   Socioeconomic Pre 28.75 %   Psych/Spiritual Pre 25 %   Family Pre 22.8 %   GLOBAL Pre 22.55 %      PHQ-9: Recent Review Flowsheet Data    Depression screen PHQ 2/9 09/03/2016 04/12/2016 04/15/2015 11/26/2013   Decreased Interest 0 0 0 0   Down, Depressed, Hopeless 0 0 0 0   PHQ - 2 Score 0 0 0 0     Interpretation of Total Score  Total Score Depression Severity:  1-4 = Minimal depression, 5-9 = Mild depression, 10-14 = Moderate depression, 15-19 = Moderately severe depression, 20-27 = Severe depression   Psychosocial Evaluation and Intervention:     Psychosocial Evaluation - 11/10/16 1317      Psychosocial Evaluation & Interventions   Interventions Encouraged to exercise with the program and follow exercise prescription   Comments pt demonstrates healthy and positive coping skills. pt has the support of his twin brother who is also a fellow participatnt in the cardiac rehab program   Expected Outcomes Pt will have demonstrate positive and healthy coping skills and contribute to the community in a meaningful way.   Continue Psychosocial Services  No Follow up required      Psychosocial Re-Evaluation:     Psychosocial Re-Evaluation    Row Name 10/11/16 1619 11/10/16 1318           Psychosocial Re-Evaluation   Current issues with None Identified None Identified      Interventions Encouraged to attend Cardiac Rehabilitation for the exercise;Relaxation education;Stress management education Encouraged to attend Cardiac Rehabilitation for the exercise;Relaxation education;Stress management education      Continue Psychosocial Services  No Follow up required No Follow up required         Psychosocial Discharge (Final Psychosocial Re-Evaluation):     Psychosocial Re-Evaluation - 11/10/16 1318      Psychosocial Re-Evaluation   Current issues with None Identified   Interventions Encouraged  to attend Cardiac Rehabilitation for the exercise;Relaxation education;Stress management education   Continue Psychosocial Services  No Follow up required      Vocational Rehabilitation: Provide vocational rehab assistance to qualifying candidates.   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 Participant will state understanding/return demonstration of topics presented.  Learning Barriers/Preferences:     Learning Barriers/Preferences - 08/26/16 1406      Learning Barriers/Preferences   Learning Barriers None   Learning Preferences Verbal Instruction;Written Material;Skilled Demonstration      Education Topics: Count Your Pulse:  -Group instruction provided by verbal instruction, demonstration, patient participation and written materials to support subject.  Instructors address importance of being able to find your pulse and how to count your pulse when at home without a heart monitor.  Patients get hands on experience counting their pulse with staff help and individually.   CARDIAC REHAB PHASE II EXERCISE from 09/17/2016 in [facility] CARD[facility]AC [facility]  Date  09/03/16  Educator  RN  Instruction Review Code  2- meets goals/outcomes      Heart Attack, Angina, and Risk Factor Modification:  -Group instruction provided by verbal instruction, video, and written materials to support subject.  Instructors address signs   and symptoms of angina and heart attacks.    Also discuss risk factors for heart disease and how to make changes to improve heart health risk factors.   Functional Fitness:  -Group instruction provided by verbal instruction, demonstration, patient  participation, and written materials to support subject.  Instructors address safety measures for doing things around the house.  Discuss how to get up and down off the floor, how to pick things up properly, how to safely get out of a chair without assistance, and balance training.   CARDIAC REHAB PHASE II EXERCISE from 09/17/2016 in [facility]  Date  09/17/16  Instruction Review Code  2- meets goals/outcomes      Meditation and Mindfulness:  -Group instruction provided by verbal instruction, patient participation, and written materials to support subject.  Instructor addresses importance of mindfulness and meditation practice to help reduce stress and improve awareness.  Instructor also leads participants through a meditation exercise.    Stretching for Flexibility and Mobility:  -Group instruction provided by verbal instruction, patient participation, and written materials to support subject.  Instructors lead participants through series of stretches that are designed to increase flexibility thus improving mobility.  These stretches are additional exercise for major muscle groups that are typically performed during regular warm up and cool down.   Hands Only CPR:  -Group verbal, video, and participation provides a basic overview of AHA guidelines for community CPR. Role-play of emergencies allow participants the opportunity to practice calling for help and chest compression technique with discussion of AED use.   Hypertension: -Group verbal and written instruction that provides a basic overview of hypertension including the most recent diagnostic guidelines, risk factor reduction with self-care instructions and medication management.    Nutrition I class: Heart Healthy Eating:  -Group instruction provided by PowerPoint slides, verbal discussion, and written materials to support subject matter. The instructor gives an explanation and review of the Therapeutic  Lifestyle Changes diet recommendations, which includes a discussion on lipid goals, dietary fat, sodium, fiber, plant stanol/sterol esters, sugar, and the components of a well-balanced, healthy diet.   Nutrition II class: Lifestyle Skills:  -Group instruction provided by PowerPoint slides, verbal discussion, and written materials to support subject matter. The instructor gives an explanation and review of label reading, grocery shopping for heart health, heart healthy recipe modifications, and ways to make healthier choices when eating out.   Diabetes Question & Answer:  -Group instruction provided by PowerPoint slides, verbal discussion, and written materials to support subject matter. The instructor gives an explanation and review of diabetes co-morbidities, pre- and post-prandial blood glucose goals, pre-exercise blood glucose goals, signs, symptoms, and treatment of hypoglycemia and hyperglycemia, and foot care basics.   Diabetes Blitz:  -Group instruction provided by PowerPoint slides, verbal discussion, and written materials to support subject matter. The instructor gives an explanation and review of the physiology behind type 1 and type 2 diabetes, diabetes medications and rational behind using different medications, pre- and post-prandial blood glucose recommendations and Hemoglobin A1c goals, diabetes diet, and exercise including blood glucose guidelines for exercising safely.    Portion Distortion:  -Group instruction provided by PowerPoint slides, verbal discussion, written materials, and food models to support subject matter. The instructor gives an explanation of serving size versus portion size, changes in portions sizes over the last 20 years, and what consists of a serving from each food group.   Stress Management:  -Group instruction provided by verbal instruction, video, and written   materials to support subject matter.  Instructors review role of stress in heart disease and how  to cope with stress positively.     Exercising on Your Own:  -Group instruction provided by verbal instruction, power point, and written materials to support subject.  Instructors discuss benefits of exercise, components of exercise, frequency and intensity of exercise, and end points for exercise.  Also discuss use of nitroglycerin and activating EMS.  Review options of places to exercise outside of rehab.  Review guidelines for sex with heart disease.   Cardiac Drugs I:  -Group instruction provided by verbal instruction and written materials to support subject.  Instructor reviews cardiac drug classes: antiplatelets, anticoagulants, beta blockers, and statins.  Instructor discusses reasons, side effects, and lifestyle considerations for each drug class.   Cardiac Drugs II:  -Group instruction provided by verbal instruction and written materials to support subject.  Instructor reviews cardiac drug classes: angiotensin converting enzyme inhibitors (ACE-I), angiotensin II receptor blockers (ARBs), nitrates, and calcium channel blockers.  Instructor discusses reasons, side effects, and lifestyle considerations for each drug class.   Anatomy and Physiology of the Circulatory System:  Group verbal and written instruction and models provide basic cardiac anatomy and physiology, with the coronary electrical and arterial systems. Review of: AMI, Angina, Valve disease, Heart Failure, Peripheral Artery Disease, Cardiac Arrhythmia, Pacemakers, and the ICD.   Other Education:  -Group or individual verbal, written, or video instructions that support the educational goals of the cardiac rehab program.   Knowledge Questionnaire Score:     Knowledge Questionnaire Score - 08/26/16 1543      Knowledge Questionnaire Score   Pre Score 22/24      Core Components/Risk Factors/Patient Goals at Admission:     Personal Goals and Risk Factors at Admission - 08/26/16 1419      Core Components/Risk  Factors/Patient Goals on Admission    Weight Management Yes;Obesity;Weight Maintenance;Weight Loss   Intervention Weight Management: Develop a combined nutrition and exercise program designed to reach desired caloric intake, while maintaining appropriate intake of nutrient and fiber, sodium and fats, and appropriate energy expenditure required for the weight goal.;Weight Management: Provide education and appropriate resources to help participant work on and attain dietary goals.;Weight Management/Obesity: Establish reasonable short term and long term weight goals.;Obesity: Provide education and appropriate resources to help participant work on and attain dietary goals.   Expected Outcomes Short Term: Continue to assess and modify interventions until short term weight is achieved;Long Term: Adherence to nutrition and physical activity/exercise program aimed toward attainment of established weight goal;Weight Maintenance: Understanding of the daily nutrition guidelines, which includes 25-35% calories from fat, 7% or less cal from saturated fats, less than 2108m cholesterol, less than 1.5gm of sodium, & 5 or more servings of fruits and vegetables daily;Weight Loss: Understanding of general recommendations for a balanced deficit meal plan, which promotes 1-2 lb weight loss per week and includes a negative energy balance of [phone] kcal/d;Understanding recommendations for meals to include 15-35% energy as protein, 25-35% energy from fat, 35-60% energy from carbohydrates, less than 2021mof dietary cholesterol, 20-35 gm of total fiber daily;Understanding of distribution of calorie intake throughout the day with the consumption of 4-5 meals/snacks   Hypertension Yes   Intervention Provide education on lifestyle modifcations including regular physical activity/exercise, weight management, moderate sodium restriction and increased consumption of fresh fruit, vegetables, and low fat dairy, alcohol moderation, and  smoking cessation.;Monitor prescription use compliance.   Expected Outcomes Short Term: Continued assessment and intervention  until BP is < 140/45m HG in hypertensive participants. < 130/819mHG in hypertensive participants with diabetes, heart failure or chronic kidney disease.;Long Term: Maintenance of blood pressure at goal levels.   Lipids Yes   Intervention Provide education and support for participant on nutrition & aerobic/resistive exercise along with prescribed medications to achieve LDL <7084mHDL >84m44m Expected Outcomes Short Term: Participant states understanding of desired cholesterol values and is compliant with medications prescribed. Participant is following exercise prescription and nutrition guidelines.;Long Term: Cholesterol controlled with medications as prescribed, with individualized exercise RX and with personalized nutrition plan. Value goals: LDL < 70mg53mL > 40 mg.      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  Would like pt to attend more consitently.     Expected Outcomes Pt achieves and/or make progress toward his desired weight, improvement of blood lipid levels and bp readings within normal limits Pt achieves and/or make progress toward his desired weight, improvement of blood lipid levels and bp readings within normal limits Pt achieves and/or make progress  toward his desired weight, improvement of blood lipid levels and bp readings within normal limits        Core Components/Risk Factors/Patient Goals at Discharge (Final Review):      Goals and Risk Factor Review - 11/10/16 1316      Core Components/Risk Factors/Patient Goals Review   Personal Goals Review Weight Management/Obesity;Hypertension;Lipids   Review Pt is a retired physiEngineer, drillingis making some progress toward achieving risk factor reduction for CAD.  Would like pt to attend more consitently.   Expected Outcomes Pt achieves and/or make progress toward his desired weight, improvement of blood lipid levels and bp readings within normal limits      ITP Comments:     ITP Comments    Row Name 08/26/16 1407           ITP Comments Dr. TraciFransico Himical Director          Comments:  Pt is making expected progress toward personal goals after completing  15 sessions. Pt out with his wife who recently had surgery.  Prior to this pt attendance was sporadic with trips to the beach.  Psychosocial Assessment -Pt exhibits positive coping skills, hopeful outlook with supportive family. No psychosocial needs identified at this time, no psychosocial interventions necessary.   Recommend continued exercise and life style modification education including  stress management and relaxation techniques to decrease cardiac risk profile. CarleCherre Huger Cardiac and PulmoTraining and development officer

## 2016-11-11 MED FILL — ROSUVASTATIN CALCIUM 20 MG: 20 | 90 days supply | Qty: 90 | Fill #0 | Status: TO

## 2016-11-12 ENCOUNTER — Encounter (HOSPITAL_COMMUNITY): Payer: Medicare Other

## 2016-11-15 ENCOUNTER — Encounter (HOSPITAL_COMMUNITY): Payer: Medicare Other

## 2016-11-17 ENCOUNTER — Encounter (HOSPITAL_COMMUNITY): Payer: Medicare Other

## 2016-11-18 ENCOUNTER — Other Ambulatory Visit: Payer: Self-pay | Admitting: Oncology

## 2016-11-18 DIAGNOSIS — Z7901 Long term (current) use of anticoagulants: Secondary | ICD-10-CM

## 2016-11-18 DIAGNOSIS — D472 Monoclonal gammopathy: Secondary | ICD-10-CM

## 2016-11-18 DIAGNOSIS — D649 Anemia, unspecified: Secondary | ICD-10-CM

## 2016-11-18 NOTE — Progress Notes (Signed)
cbc

## 2016-11-19 ENCOUNTER — Encounter (HOSPITAL_COMMUNITY)
Admission: RE | Admit: 2016-11-19 | Discharge: 2016-11-19 | Disposition: A | Discharge status: Home / Self Care | Payer: Medicare Other | Source: Ambulatory Visit | Attending: Cardiovascular Disease | Admitting: Cardiovascular Disease

## 2016-11-19 ENCOUNTER — Other Ambulatory Visit (INDEPENDENT_AMBULATORY_CARE_PROVIDER_SITE_OTHER): Payer: Medicare Other

## 2016-11-19 ENCOUNTER — Encounter (HOSPITAL_COMMUNITY): Payer: Medicare Other

## 2016-11-19 DIAGNOSIS — I208 Other forms of angina pectoris: Secondary | ICD-10-CM | POA: Insufficient documentation

## 2016-11-19 DIAGNOSIS — D649 Anemia, unspecified: Secondary | ICD-10-CM

## 2016-11-19 DIAGNOSIS — N400 Enlarged prostate without lower urinary tract symptoms: Secondary | ICD-10-CM | POA: Diagnosis not present

## 2016-11-19 DIAGNOSIS — I350 Nonrheumatic aortic (valve) stenosis: Secondary | ICD-10-CM | POA: Diagnosis not present

## 2016-11-19 DIAGNOSIS — I480 Paroxysmal atrial fibrillation: Secondary | ICD-10-CM | POA: Diagnosis not present

## 2016-11-19 DIAGNOSIS — Z7901 Long term (current) use of anticoagulants: Secondary | ICD-10-CM

## 2016-11-19 DIAGNOSIS — M1990 Unspecified osteoarthritis, unspecified site: Secondary | ICD-10-CM | POA: Insufficient documentation

## 2016-11-19 DIAGNOSIS — R011 Cardiac murmur, unspecified: Secondary | ICD-10-CM | POA: Insufficient documentation

## 2016-11-19 DIAGNOSIS — Z79899 Other long term (current) drug therapy: Secondary | ICD-10-CM | POA: Insufficient documentation

## 2016-11-19 DIAGNOSIS — Z8673 Personal history of transient ischemic attack (TIA), and cerebral infarction without residual deficits: Secondary | ICD-10-CM | POA: Insufficient documentation

## 2016-11-19 DIAGNOSIS — D472 Monoclonal gammopathy: Secondary | ICD-10-CM

## 2016-11-19 DIAGNOSIS — I498 Other specified cardiac arrhythmias: Secondary | ICD-10-CM | POA: Insufficient documentation

## 2016-11-22 ENCOUNTER — Encounter (HOSPITAL_COMMUNITY): Payer: Medicare Other

## 2016-11-22 LAB — COMPREHENSIVE METABOLIC PANEL
ALBUMIN: 4.3 g/dL (ref 3.5–4.7)
ALK PHOS: 81 IU/L (ref 39–117)
ALT: 17 IU/L (ref 0–44)
AST: 21 IU/L (ref 0–40)
Albumin/Globulin Ratio: 1.5 (ref 1.2–2.2)
BUN / CREAT RATIO: 14 (ref 10–24)
BUN: 14 mg/dL (ref 8–27)
Bilirubin Total: 0.4 mg/dL (ref 0.0–1.2)
CALCIUM: 9.4 mg/dL (ref 8.6–10.2)
CO2: 26 mmol/L (ref 20–29)
CREATININE: 1 mg/dL (ref 0.76–1.27)
Chloride: 103 mmol/L (ref 96–106)
GFR, EST AFRICAN AMERICAN: 82 mL/min/{1.73_m2} (ref >59)
GFR, EST NON AFRICAN AMERICAN: 71 mL/min/{1.73_m2} (ref >59)
GLOBULIN, TOTAL: 2.9 g/dL (ref 1.5–4.5)
GLUCOSE: 128 mg/dL — AB (ref 65–99)
Potassium: 4.4 mmol/L (ref 3.5–5.2)
SODIUM: 144 mmol/L (ref 134–144)

## 2016-11-22 LAB — IMMUNOFIXATION ELECTROPHORESIS
IGM (IMMUNOGLOBULIN M), SRM: 648 mg/dL — AB (ref 15–143)
IgA/Immunoglobulin A, Serum: 157 mg/dL (ref 61–437)
IgG (Immunoglobin G), Serum: 854 mg/dL (ref 700–1600)
Total Protein: 7.2 g/dL (ref 6.0–8.5)

## 2016-11-22 LAB — CBC WITH DIFFERENTIAL/PLATELET
BASOS ABS: 0 10*3/uL (ref 0.0–0.2)
Basos: 0 %
EOS (ABSOLUTE): 0.2 10*3/uL (ref 0.0–0.4)
Eos: 3 %
HEMOGLOBIN: 12.1 g/dL — AB (ref 13.0–17.7)
Hematocrit: 36.6 % — ABNORMAL LOW (ref 37.5–51.0)
IMMATURE GRANS (ABS): 0 10*3/uL (ref 0.0–0.1)
Immature Granulocytes: 0 %
LYMPHS: 13 %
Lymphocytes Absolute: 0.7 10*3/uL (ref 0.7–3.1)
MCH: 28.5 pg (ref 26.6–33.0)
MCHC: 33.1 g/dL (ref 31.5–35.7)
MCV: 86 fL (ref 79–97)
MONOCYTES: 7 %
Monocytes Absolute: 0.4 10*3/uL (ref 0.1–0.9)
NEUTROS PCT: 77 %
Neutrophils Absolute: 4.3 10*3/uL (ref 1.4–7.0)
Platelets: 190 10*3/uL (ref 150–379)
RBC: 4.25 x10E6/uL (ref 4.14–5.80)
RDW: 14.6 % (ref 12.3–15.4)
WBC: 5.5 10*3/uL (ref 3.4–10.8)

## 2016-11-22 LAB — LACTATE DEHYDROGENASE: LDH: 208 IU/L (ref 121–224)

## 2016-11-23 MED FILL — ELIQUIS 5 MG TABLET: 5 | 90 days supply | Qty: 180 | Fill #0

## 2016-11-23 MED FILL — DOXAZOSIN MESYLATE 4 MG TAB: 4 | 90 days supply | Qty: 90 | Fill #0

## 2016-11-23 MED FILL — POTASSIUM CL ER 20 MEQ TAB: 20 | 90 days supply | Qty: 90 | Fill #0 | Status: TO

## 2016-11-24 ENCOUNTER — Encounter (HOSPITAL_COMMUNITY): Payer: Medicare Other

## 2016-11-24 NOTE — Procedures (Addendum)
Patient Name: [name], [name] Date: 11/09/2016 Gender: Male D.O.B: January 16, 1935 Age (years): 45 Referring Provider: Shelva Majestic MD, ABSM Height (inches): 73 Interpreting Physician: Shelva Majestic MD, ABSM Weight (lbs): 225 RPSGT: Carolin Coy BMI: 30 MRN: [id number] Neck Size: 17.50  CLINICAL INFORMATION Sleep Study Type: NPSG  Indication for sleep study: Excessive Daytime Sleepiness, Fatigue, Morning Headaches  Epworth Sleepiness Score: 3  SLEEP STUDY TECHNIQUE As per the AASM Manual for the Scoring of Sleep and Associated Events v2.3 (April 2016) with a hypopnea requiring 4% desaturations.  The channels recorded and monitored were frontal, central and occipital EEG, electrooculogram (EOG), submentalis EMG (chin), nasal and oral airflow, thoracic and abdominal wall motion, anterior tibialis EMG, snore microphone, electrocardiogram, and pulse oximetry.  MEDICATIONS . apixaban (ELIQUIS) 5 MG TABS tablet Take 1 tablet (5 mg total) by mouth 2 (two) times daily. 180 tablet 3  . bisacodyl (DULCOLAX) 5 MG EC tablet Take 5 mg by mouth daily as needed (constipation).     . butalbital-acetaminophen-caffeine (FIORICET, ESGIC) 50-325-40 MG per tablet Take 1 tablet by mouth 2 (two) times daily as needed (osteo-arthritis pain).     . Cholecalciferol (VITAMIN D3) 2000 units capsule Take 1 capsule by mouth daily.    . Coenzyme Q10 (COQ10) 100 MG CAPS Take 100 mg by mouth daily.    . Diclofenac Sodium 1.5 % SOLN Take 1 application by mouth 2 (two) times daily as needed (Knee pain).     Marland Kitchen doxazosin (CARDURA) 4 MG tablet Take 4 mg by mouth at bedtime.     Marland Kitchen HYDROcodone-acetaminophen (NORCO) 10-325 MG tablet Take 1 tablet by mouth 2 (two) times daily as needed. 100 tablet 0  . LORazepam (ATIVAN) 1 MG tablet Take 1 mg by mouth at bedtime.     . magnesium oxide (MAG-OX) 400 MG tablet Take 400 mg by mouth daily.    . metoprolol succinate (TOPROL-XL) 100 MG 24 hr tablet  Take 1 tablet (100 mg total) by mouth 2 (two) times daily. 180 tablet 3  . nitroGLYCERIN (NITROSTAT) 0.4 MG SL tablet Place 1 tablet (0.4 mg total) under the tongue every 5 (five) minutes as needed for chest pain. 25 tablet 3  . OVER THE COUNTER MEDICATION Apply 1 patch topically daily as needed (neck and shoulder pain). Chinese pain patch    . potassium chloride SA (K-DUR,KLOR-CON) 20 MEQ tablet TAKE 1 TABLET BY MOUTH ONCE DAILY 90 tablet 3  . predniSONE (DELTASONE) 5 MG tablet Take 2.5-5 mg by mouth daily as needed.     . rosuvastatin (CRESTOR) 20 MG tablet Take 1 tablet by mouth at bedtime.      Medications self-administered by patient taken the night of the study : ELIQUIS, DOXAZOSIN, ATIVAN, METOPROLOL SUCCINATE, [place] The study was initiated at 10:40:28 PM and ended at 5:27:34 AM.  Sleep onset time was 0.4 minutes and the sleep efficiency was 89.4%. The total sleep time was 364.0 minutes.  Stage REM latency was 187.0 minutes.  The patient spent 11.68% of the night in stage N1 sleep, 73.21% in stage N2 sleep, 0.00% in stage N3 and 15.11% in REM.  Alpha intrusion was absent.  Supine sleep was 100.00%.  RESPIRATORY PARAMETERS The overall apnea/hypopnea index (AHI) was 1.2 per hour. The respiratory disturbance index (RDI) was 2.8 per hour.  There were 3 total apneas, including 2 obstructive, 0 central and 1 mixed apneas. There were 4 hypopneas and 10 RERAs.  The AHI during Stage REM sleep  was 4.4 per hour.  AHI while supine was 1.2 per hour.  The mean oxygen saturation was 93.59%. The minimum SpO2 during sleep was 91.00%.  Soft snoring was noted during this study.  CARDIAC DATA The 2 lead EKG demonstrated sinus rhythm. The mean heart rate was 61.79 beats per minute. Other EKG findings include: The underlying rhythm appeared to be atrial flutter with variable block.  There were rare to occasional PVCs.  LEG MOVEMENT DATA The total PLMS were 0 with a  resulting PLMS index of 0.00. Associated arousal with leg movement index was 0.0 .  IMPRESSIONS - Very mild increased upper airway resistance without significant obstructive sleep apnea (AHI 1.2/h; RDI 2.8//h).  AHI during REM sleep was 4.4/h. - No significant central sleep apnea occurred during this study (CAI = 0.0/h). - No significant oxygen desaturation;  oxygen nadir 91%. - The patient snored with Soft snoring volume. - Excellent sleep efficiency but with prolonged latency to REM sleep. - EKG findings include atrial flutter with variable block with rare to occasional PVCs. - Clinically significant periodic limb movements did not occur during sleep. No significant associated arousals.  DIAGNOSIS - Mild increased airway resistance - Mild snoring  RECOMMENDATIONS - There is no indication for CPAP therapy. - Effort should be made to optimize nasal and oral pharyngeal patency. - Avoid alcohol, sedatives and other CNS depressants that may worsen sleep apnea and disrupt normal sleep architecture. - Sleep hygiene should be reviewed to assess factors that may improve sleep quality. - Weight management and regular exercise should be continued.  [Electronically signed] 11/24/2016 05:33 PM  Shelva Majestic MD, [facility], [place], American Board of Sleep Medicine   NPI: [id number] [place] PH: [phone]   FX: [phone] [place]

## 2016-11-26 ENCOUNTER — Encounter (HOSPITAL_COMMUNITY): Payer: Medicare Other

## 2016-11-27 ENCOUNTER — Telehealth: Payer: Self-pay | Admitting: Internal Medicine

## 2016-11-27 DIAGNOSIS — K5732 Diverticulitis of large intestine without perforation or abscess without bleeding: Secondary | ICD-10-CM

## 2016-11-27 MED ORDER — AMOXICILLIN-POT CLAVULANATE 875-125 MG PO TABS: 1.0000 | ORAL_TABLET | Freq: Two times a day (BID) | ORAL | 1 refills | Status: DC

## 2016-11-27 NOTE — Telephone Encounter (Signed)
Having recurrent diverticulitis sxs - wants refill Augmentin  Is on some now wants refill  No worrisome sigs reported  Rx sent

## 2016-11-29 ENCOUNTER — Encounter (HOSPITAL_COMMUNITY): Payer: Medicare Other

## 2016-11-29 ENCOUNTER — Encounter (HOSPITAL_COMMUNITY)
Admission: RE | Admit: 2016-11-29 | Discharge: 2016-11-29 | Disposition: A | Discharge status: Home / Self Care | Payer: Medicare Other | Source: Ambulatory Visit | Attending: Cardiovascular Disease | Admitting: Cardiovascular Disease

## 2016-11-29 DIAGNOSIS — I480 Paroxysmal atrial fibrillation: Secondary | ICD-10-CM | POA: Insufficient documentation

## 2016-11-29 DIAGNOSIS — Z79899 Other long term (current) drug therapy: Secondary | ICD-10-CM | POA: Diagnosis not present

## 2016-11-29 DIAGNOSIS — I208 Other forms of angina pectoris: Secondary | ICD-10-CM | POA: Insufficient documentation

## 2016-11-29 DIAGNOSIS — I498 Other specified cardiac arrhythmias: Secondary | ICD-10-CM | POA: Insufficient documentation

## 2016-11-29 DIAGNOSIS — Z7901 Long term (current) use of anticoagulants: Secondary | ICD-10-CM | POA: Insufficient documentation

## 2016-11-29 DIAGNOSIS — R011 Cardiac murmur, unspecified: Secondary | ICD-10-CM | POA: Diagnosis not present

## 2016-11-29 DIAGNOSIS — Z8673 Personal history of transient ischemic attack (TIA), and cerebral infarction without residual deficits: Secondary | ICD-10-CM | POA: Insufficient documentation

## 2016-11-29 DIAGNOSIS — N400 Enlarged prostate without lower urinary tract symptoms: Secondary | ICD-10-CM | POA: Diagnosis not present

## 2016-11-29 DIAGNOSIS — I350 Nonrheumatic aortic (valve) stenosis: Secondary | ICD-10-CM | POA: Diagnosis not present

## 2016-11-29 DIAGNOSIS — M1990 Unspecified osteoarthritis, unspecified site: Secondary | ICD-10-CM | POA: Diagnosis not present

## 2016-11-29 MED FILL — AMOX TR-K CLV 875-125 MG TA: 875-125 | 10 days supply | Qty: 20 | Fill #0

## 2016-12-01 ENCOUNTER — Encounter (HOSPITAL_COMMUNITY): Payer: Medicare Other

## 2016-12-05 ENCOUNTER — Encounter (HOSPITAL_COMMUNITY): Payer: Self-pay

## 2016-12-05 NOTE — Progress Notes (Signed)
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  amoxicillin-clavulanate (AUGMENTIN) 875-125 MG tablet, Take 1 tablet by mouth 2 (two) times daily., Disp: 20 tablet, Rfl: 1 .  apixaban (ELIQUIS) 5 MG TABS tablet, Take 1 tablet (5 mg total) by mouth 2 (two) times daily., Disp: 180 tablet, Rfl: 3 .  bisacodyl (DULCOLAX) 5 MG EC tablet, Take 5 mg by mouth daily as needed (constipation). , Disp: , Rfl:  .  butalbital-acetaminophen-caffeine (FIORICET, ESGIC) 50-325-40 MG per tablet, Take 1 tablet by mouth 2 (two) times daily as needed (osteo-arthritis pain). , Disp: , Rfl:  .  Cholecalciferol (VITAMIN D3) 2000 units capsule, Take 1 capsule by mouth daily., Disp: , Rfl:  .  Coenzyme Q10 (COQ10) 100 MG CAPS, Take 100 mg by mouth daily., Disp: , Rfl:  .  Diclofenac Sodium 1.5 % SOLN, Take 1 application by mouth 2 (two) times daily as needed (Knee pain). , Disp: , Rfl:  .  doxazosin (CARDURA) 4 MG tablet, Take 4 mg by mouth at bedtime. , Disp: , Rfl:  .  HYDROcodone-acetaminophen (NORCO) 10-325 MG tablet, Take 1 tablet by mouth 2 (two) times daily as needed., Disp: 100 tablet, Rfl: 0 .  LORazepam (ATIVAN) 1 MG tablet, Take 1 mg by mouth at bedtime. , Disp: , Rfl:  .  magnesium oxide (MAG-OX) 400 MG tablet, Take 400 mg by mouth daily., Disp: , Rfl:  .  metoprolol succinate (TOPROL-XL) 100 MG 24 hr tablet, Take 1 tablet (100 mg total) by mouth 2 (two) times daily.,  Disp: 180 tablet, Rfl: 3 .  nitroGLYCERIN (NITROSTAT) 0.4 MG SL tablet, Place 1 tablet (0.4 mg total) under the tongue every 5 (five) minutes as needed for chest pain., Disp: 25 tablet, Rfl: 3 .  OVER THE COUNTER MEDICATION, Apply 1 patch topically daily as needed (neck and shoulder pain). Chinese pain patch, Disp: , Rfl:  .  potassium chloride SA (K-DUR,KLOR-CON) 20 MEQ tablet, TAKE 1 TABLET BY MOUTH ONCE DAILY, Disp: 90 tablet, Rfl: 3 .  predniSONE (DELTASONE) 5 MG tablet, Take 2.5-5 mg by mouth daily as needed. , Disp: , Rfl:  .  rosuvastatin (CRESTOR) 20 MG tablet, TAKE 1 TABLET BY MOUTH AT BEDTIME., Disp: 90 tablet, Rfl: 2  Past Medical History: Past Medical History:  Diagnosis Date  . Aortic stenosis    moderate aortic stenosis  . Arthritis   . Benign prostatic hypertrophy   . 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  . Coronary artery disease    status post multiple prior percutaneous coronary interventions, microvascular angina per Dr Olevia Perches  . Heart murmur   . Hypercoagulable state ([place])    chronically anticoagulated with coumadin  . Hyperlipidemia   . Hyperthyroidism   . Hypothyroidism    Dr. Elyse Hsu  . MGUS (monoclonal gammopathy  of unknown significance) 02/17/2013  . Paroxysmal atrial fibrillation ([place])    DR. Lia Foyer,   . Stroke ([place])    1990    Tobacco Use: History  Smoking Status  . Never Smoker  Smokeless Tobacco  . Never Used    Labs: Recent Review Flowsheet Data    Labs for ITP Cardiac and Pulmonary Rehab Latest Ref Rng & Units 11/26/2013 09/10/2014 07/02/2016 07/06/2016 07/06/2016   Cholestrol 100 - 199 mg/dL 150 259(H) 162 - -   LDLCALC 0 - 99 mg/dL 69 159(H) 67 - -   LDLDIRECT mg/dL - - - - -   HDL >39 mg/dL 59 77 71 - -   Trlycerides 0 - 149 mg/dL 112 113 120 - -   Hemoglobin A1c 4.8 - 5.6 % 5.8(H)  - 5.7(H) - -   PHART 7.350 - 7.450 - - - - 7.373   PCO2ART 32.0 - 48.0 mmHg - - - - 42.0   HCO3 20.0 - 28.0 mmol/L - - - 25.8 24.5   TCO2 0 - 100 mmol/L - - - 27 26   ACIDBASEDEF 0.0 - 2.0 mmol/L - - - - 1.0   O2SAT % - - - 67.0 98.0      Capillary Blood Glucose: Lab Results  Component Value Date   GLUCAP 114 (H) 03/21/2011   GLUCAP 135 (H) 03/20/2011   GLUCAP 127 (H) 03/20/2011   GLUCAP 123 (H) 03/20/2011   GLUCAP 146 (H) 03/19/2011     Exercise Target Goals:    Exercise Program Goal: Individual exercise prescription set with THRR, safety & activity barriers. Participant demonstrates ability to understand and report RPE using BORG scale, to self-measure pulse accurately, and to acknowledge the importance of the exercise prescription.  Exercise Prescription Goal: Starting with aerobic activity 30 plus minutes a day, 3 days per week for initial exercise prescription. Provide home exercise prescription and guidelines that participant acknowledges understanding prior to discharge.  Activity Barriers & Risk Stratification:     Activity Barriers & Cardiac Risk Stratification - 08/26/16 1620      Activity Barriers & Cardiac Risk Stratification   Activity Barriers None   Cardiac Risk Stratification High      6 Minute Walk:     6 Minute Walk    Row Name 08/26/16 1544         6 Minute Walk   Phase Initial     Distance 1821 feet     Walk Time 6 minutes     # of Rest Breaks 0     MPH 3.4     METS 3.3     RPE 11     VO2 Peak 11.5     Symptoms No     Resting HR 73 bpm     Resting BP 142/80     Max Ex. HR 96 bpm     Max Ex. BP 148/88     2 Minute Post BP 118/78        Oxygen Initial Assessment:   Oxygen Re-Evaluation:   Oxygen Discharge (Final Oxygen Re-Evaluation):   Initial Exercise Prescription:     Initial Exercise Prescription - 08/26/16 1500      Date of Initial Exercise RX and Referring Provider   Date 08/26/16   Referring Provider Sherren Mocha MD     Treadmill   MPH 2.8   Grade 1   Minutes 10   METs 3     Recumbant Bike   Level 2  Minutes 10   METs 3     NuStep   Level 3   Minutes 10   METs 3     Prescription Details   Frequency (times per week) 3   Duration Progress to 45 minutes of aerobic exercise without signs/symptoms of physical distress     Intensity   THRR 40-80% of Max Heartrate 56-115   Ratings of Perceived Exertion 11-13   Perceived Dyspnea 0-4     Progression   Progression Continue to progress workloads to maintain intensity without signs/symptoms of physical distress.     Resistance Training   Training Prescription Yes   Weight 2   Reps 10-15      Perform Capillary Blood Glucose checks as needed.  Exercise Prescription Changes:      Exercise Prescription Changes    Row Name 09/03/16 1041 09/13/16 1000 09/27/16 1123 10/18/16 1427 10/25/16 1423     Response to Exercise   Blood Pressure (Admit) 112/82 98/62 112/66 104/66 118/70   Blood Pressure (Exercise) 130/70 119/78 128/72 122/70 120/60   Blood Pressure (Exit) 112/62 108/60 112/64 104/60 97/66   Heart Rate (Admit) 85 bpm 75 bpm 72 bpm 78 bpm 80 bpm   Heart Rate (Exercise) 105 bpm 102 bpm 96 bpm 99 bpm 115 bpm   Heart Rate (Exit) 85 bpm 75 bpm 72 bpm 88 bpm 80 bpm   Rating of Perceived Exertion (Exercise) _0 Symptoms none none  none none none   Comments pt was oriented to exercise equipment on 09/03/16  -  -  -  -   Duration Continue with 30 min of aerobic exercise without signs/symptoms of physical distress. Continue with 30 min of aerobic exercise without signs/symptoms of physical distress. Continue with 30 min of aerobic exercise without signs/symptoms of physical distress. Continue with 30 min of aerobic exercise without signs/symptoms of physical distress. Continue with 30 min of aerobic exercise without signs/symptoms of physical distress.   Intensity THRR unchanged THRR unchanged THRR unchanged THRR unchanged  THRR unchanged     Progression   Progression Continue to progress workloads to maintain intensity without signs/symptoms of physical distress. Continue to progress workloads to maintain intensity without signs/symptoms of physical distress. Continue to progress workloads to maintain intensity without signs/symptoms of physical distress. Continue to progress workloads to maintain intensity without signs/symptoms of physical distress. Continue to progress workloads to maintain intensity without signs/symptoms of physical distress.   Average METs 3.4 2.9 3.6 3.7 4.1     Resistance Training   Training Prescription Yes No No No No   Weight 2lbs  -  -  -  -   Reps 10-15  -  -  -  -   Time 10 Minutes  -  -  -  -     Treadmill   MPH 2.8 2.8 2.6 2.6 2.6   Grade _1 Minutes _2 METs 3.53 3.53 3.71 3.71 3.71     Recumbant Bike   Level 2.5 2.5 3.5 3.5 4   Minutes _3 NuStep   Level _4 Minutes _5 METs 3.4 2.4 3.5 3.8 4.6     Home Exercise Plan   Plans to continue exercise at  -  - Home (comment)  home gym Home (comment)  home gym Home (comment)  home gym   Frequency  -  - Add 2 additional days to program exercise sessions. Add 2 additional days to program exercise sessions. Add 2 additional days to program exercise sessions.   Initial Home Exercises Provided  -  - 09/15/16 09/15/16 09/15/16   Row Name 11/19/16 1601 11/29/16 1557           Response to Exercise   Blood Pressure (Admit) 106/62 124/70      Blood Pressure (Exercise) 110/72 122/68      Blood Pressure (Exit) 104/60 104/60      Heart Rate (Admit) 90 bpm 73 bpm      Heart Rate (Exercise) 104 bpm 109 bpm      Heart Rate (Exit) 88 bpm 72 bpm      Rating of Perceived Exertion (Exercise) 10 11      Symptoms none none      Duration Continue with 30 min of aerobic exercise without signs/symptoms of physical distress. Continue with 30 min of aerobic exercise without  signs/symptoms of physical distress.      Intensity THRR unchanged THRR unchanged        Progression   Progression Continue to progress workloads to maintain intensity without signs/symptoms of physical distress. Continue to progress workloads to maintain intensity without signs/symptoms of physical distress.      Average METs 4.1 4.1        Resistance Training   Training Prescription No No        Treadmill   MPH 2.6 2.6      Grade 2 2      Minutes 10 10      METs 3.71 3.71        Recumbant Bike   Level 4 4      Minutes 10 10      METs 3.8 3.8        NuStep   Level 4 4      Minutes 10 10      METs 4.8 4.8        Home Exercise Plan   Plans to continue exercise at Home (comment)  home gym Home (comment)  home gym      Frequency Add 2 additional days to program exercise sessions. Add 2 additional days to program exercise sessions.      Initial Home Exercises Provided 09/15/16 09/15/16         Exercise Comments:      Exercise Comments    Row Name 09/13/16 1044 09/15/16 1239 10/11/16 1657 11/09/16 1425 12/07/16 1602   Exercise Comments Pt was oriented to exercise equipment on 09/03/16. Pt is tolerating exercise fairly well; will continue to monitor exercise/activity progression. Reviewed HEP on 09/15/16. Reviewed METs and goals. Pt is tolerating exercise well; will continue to improve in cardiorespiratory fitness. Unable to access METs and goals. Pt last exercise session was 10/25/16. Will f/u Pt plans to graduate on Friday 12/10/16 in which he will use home gym to continue exercise program.    [place] Name 12/07/16 1605           Exercise Comments Pt plans to graduate on Friday 12/10/16 in which he will use home gym to continue exercise program. Pt feels comfortable about exercising on his own.          Exercise Goals and Review:      Exercise Goals    Row Name 08/26/16 1413             Exercise Goals  Increase Physical Activity Yes       Intervention Provide  advice, education, support and counseling about physical activity/exercise needs.;Develop an individualized exercise prescription for aerobic and resistive training based on initial evaluation findings, risk stratification, comorbidities and participant's personal goals.       Expected Outcomes Achievement of increased cardiorespiratory fitness and enhanced flexibility, muscular endurance and strength shown through measurements of functional capacity and personal statement of participant.       Increase Strength and Stamina Yes       Intervention Provide advice, education, support and counseling about physical activity/exercise needs.;Develop an individualized exercise prescription for aerobic and resistive training based on initial evaluation findings, risk stratification, comorbidities and participant's personal goals.       Expected Outcomes Achievement of increased cardiorespiratory fitness and enhanced flexibility, muscular endurance and strength shown through measurements of functional capacity and personal statement of participant.          Exercise Goals Re-Evaluation :     Exercise Goals Re-Evaluation    Row Name 09/15/16 1239 10/11/16 1654 11/09/16 1426 12/07/16 1605       Exercise Goal Re-Evaluation   Exercise Goals Review Increase Physical Activity;Increase Strenth and Stamina Increase Physical Activity;Increase Strenth and Stamina Increase Physical Activity;Able to understand and use rate of perceived exertion (RPE) scale;Knowledge and understanding of Target Heart Rate Range (THRR);Understanding of Exercise Prescription;Increase Strength and Stamina;Able to check pulse independently Increase Physical Activity;Able to understand and use rate of perceived exertion (RPE) scale;Knowledge and understanding of Target Heart Rate Range (THRR);Understanding of Exercise Prescription;Increase Strength and Stamina;Able to check pulse independently    Comments Reviewed home exercise with pt today.   Pt plans to go to [employer] for exercise,3x/week in addition to coming to cardiac rehab.  Reviewed THR, pulse, RPE, sign and symptoms, NTG use, and when to call 911 or MD.  Also discussed weather considerations and indoor options.  Pt voiced understanding. Pt is using home gym 2x/week in addition to coming to cardiac rehab. Pt is also walks the dog multiple times a day. Pt is progressing well in cardiac rehab. Pt MET levels are increasing and evident that pt is getting stronger. Pt continues to exercise for 30 minutes in cardiac rehab without signs and symptoms physical distress.    Expected Outcomes Pt will continue to improve in cardiorespiratory fitness and energy levels. Pt will continue to improve in cardiorespiratory fitness and energy levels. Pt will continue to improve in cardiorespiratory fitness and energy levels. Pt will continue to improve in cardiorespiratory fitness and energy levels.        Discharge Exercise Prescription (Final Exercise Prescription Changes):     Exercise Prescription Changes - 11/29/16 1557      Response to Exercise   Blood Pressure (Admit) 124/70   Blood Pressure (Exercise) 122/68   Blood Pressure (Exit) 104/60   Heart Rate (Admit) 73 bpm   Heart Rate (Exercise) 109 bpm   Heart Rate (Exit) 72 bpm   Rating of Perceived Exertion (Exercise) 11   Symptoms none   Duration Continue with 30 min of aerobic exercise without signs/symptoms of physical distress.   Intensity THRR unchanged     Progression   Progression Continue to progress workloads to maintain intensity without signs/symptoms of physical distress.   Average METs 4.1     Resistance Training   Training Prescription No     Treadmill   MPH 2.6   Grade 2   Minutes 10   METs 3.71  Recumbant Bike   Level 4   Minutes 10   METs 3.8     NuStep   Level 4   Minutes 10   METs 4.8     Home Exercise Plan   Plans to continue exercise at Home (comment)  home gym   Frequency Add 2  additional days to program exercise sessions.   Initial Home Exercises Provided 09/15/16      Nutrition:  Target Goals: Understanding of nutrition guidelines, daily intake of sodium <1584m, cholesterol <2071m calories 30% from fat and 7% or less from saturated fats, daily to have 5 or more servings of fruits and vegetables.  Biometrics:     Pre Biometrics - 08/26/16 1607      Pre Biometrics   Height 6' 1" (1.854 m)   Weight 231 lb 4.2 oz (104.9 kg)   Waist Circumference 43 inches   Hip Circumference 45.5 inches   Waist to Hip Ratio 0.95 %   BMI (Calculated) 30.6   Triceps Skinfold 15 mm   % Body Fat 29.8 %   Grip Strength 43 kg   Flexibility 7 in   Single Leg Stand 18.9 seconds       Nutrition Therapy Plan and Nutrition Goals:     Nutrition Therapy & Goals - 08/26/16 1546      Nutrition Therapy   Diet Therapeutic Lifestyle Changes     Personal Nutrition Goals   Nutrition Goal Wt loss of 1-2 lb/week to a wt loss goal of 6-24 lb at graduation from [facility]     Intervention Plan   Intervention Prescribe, educate and counsel regarding individualized specific dietary modifications aiming towards targeted core components such as weight, hypertension, lipid management, diabetes, heart failure and other comorbidities.   Expected Outcomes Short Term Goal: Understand basic principles of dietary content, such as calories, fat, sodium, cholesterol and nutrients.;Long Term Goal: Adherence to prescribed nutrition plan.      Nutrition Discharge: Nutrition Scores:     Nutrition Assessments - 08/26/16 1545      MEDFICTS Scores   Pre Score 3      Nutrition Goals Re-Evaluation:   Nutrition Goals Re-Evaluation:   Nutrition Goals Discharge (Final Nutrition Goals Re-Evaluation):   Psychosocial: Target Goals: Acknowledge presence or absence of significant depression and/or stress, maximize coping skills, provide positive support system. Participant is able to  verbalize types and ability to use techniques and skills needed for reducing stress and depression.  Initial Review & Psychosocial Screening:     Initial Psych Review & Screening - 09/12/16 1923      Barriers   Psychosocial barriers to participate in program There are no identifiable barriers or psychosocial needs.     Screening Interventions   Interventions Encouraged to exercise      Quality of Life Scores:     Quality of Life - 08/26/16 1548      Quality of Life Scores   Health/Function Pre 18.57 %   Socioeconomic Pre 28.75 %   Psych/Spiritual Pre 25 %   Family Pre 22.8 %   GLOBAL Pre 22.55 %      PHQ-9: Recent Review Flowsheet Data    [facility] 09/03/2016 04/12/2016 04/15/2015 11/26/2013   Decreased Interest 0 0 0 0   Down, Depressed, Hopeless 0 0 0 0   PHQ - 2 Score 0 0 0 0     Interpretation of Total Score  Total Score Depression Severity:  1-4 = Minimal depression, 5-9 =  Mild depression, 10-14 = Moderate depression, 15-19 = Moderately severe depression, 20-27 = Severe depression   Psychosocial Evaluation and Intervention:     Psychosocial Evaluation - 12/05/16 2009      Psychosocial Evaluation & Interventions   Interventions Encouraged to exercise with the program and follow exercise prescription   Comments pt demonstrates healthy and positive coping skills. pt has the support of his twin brother who is also a fellow participatnt in the cardiac rehab program   Expected Outcomes Pt will have demonstrate positive and healthy coping skills and contribute to the community in a meaningful way.   Continue Psychosocial Services  No Follow up required      Psychosocial Re-Evaluation:     Psychosocial Re-Evaluation    [place] Name 10/11/16 1619 11/10/16 1318 12/05/16 2010         Psychosocial Re-Evaluation   Current issues with None Identified None Identified None Identified     Interventions Encouraged to attend Cardiac Rehabilitation for the  exercise;Relaxation education;Stress management education Encouraged to attend Cardiac Rehabilitation for the exercise;Relaxation education;Stress management education Encouraged to attend Cardiac Rehabilitation for the exercise;Relaxation education;Stress management education     Continue Psychosocial Services  No Follow up required No Follow up required No Follow up required        Psychosocial Discharge (Final Psychosocial Re-Evaluation):     Psychosocial Re-Evaluation - 12/05/16 2010      Psychosocial Re-Evaluation   Current issues with None Identified   Interventions Encouraged to attend Cardiac Rehabilitation for the exercise;Relaxation education;Stress management education   Continue Psychosocial Services  No Follow up required      Vocational Rehabilitation: Provide vocational rehab assistance to qualifying candidates.   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 Participant will state understanding/return demonstration of topics presented.  Learning Barriers/Preferences:     Learning Barriers/Preferences - 08/26/16 1406      Learning Barriers/Preferences   Learning Barriers None   Learning Preferences Verbal Instruction;Written Material;Skilled Demonstration      Education Topics: Count Your Pulse:  -Group instruction provided by verbal instruction, demonstration, patient participation and written materials to support subject.  Instructors address importance of being able to find your pulse and how to count your pulse when at home without a heart monitor.  Patients get hands on experience counting their pulse with staff help and individually.   CARDIAC REHAB PHASE II  EXERCISE from 11/19/2016 in [place]  Date  09/03/16  Educator  RN  Instruction Review Code  2- meets goals/outcomes      Heart Attack, Angina, and Risk Factor Modification:  -Group instruction provided by verbal instruction, video, and written materials to support subject.  Instructors address signs and symptoms of angina and heart attacks.    Also discuss risk factors for heart disease and how to make changes to improve heart health risk factors.   Functional Fitness:  -Group instruction provided by verbal instruction, demonstration, patient participation, and written materials to support subject.  Instructors address safety measures for doing things around the house.  Discuss how to get up and down off the floor, how to pick things up properly, how to safely get  out of a chair without assistance, and balance training.   CARDIAC REHAB PHASE II EXERCISE from 11/19/2016 in [place]  Date  09/17/16  Instruction Review Code  2- meets goals/outcomes      Meditation and Mindfulness:  -Group instruction provided by verbal instruction, patient participation, and written materials to support subject.  Instructor addresses importance of mindfulness and meditation practice to help reduce stress and improve awareness.  Instructor also leads participants through a meditation exercise.    Stretching for Flexibility and Mobility:  -Group instruction provided by verbal instruction, patient participation, and written materials to support subject.  Instructors lead participants through series of stretches that are designed to increase flexibility thus improving mobility.  These stretches are additional exercise for major muscle groups that are typically performed during regular warm up and cool down.   Hands Only CPR:  -Group verbal, video, and participation provides a basic overview of AHA guidelines for community CPR. Role-play of  emergencies allow participants the opportunity to practice calling for help and chest compression technique with discussion of AED use.   Hypertension: -Group verbal and written instruction that provides a basic overview of hypertension including the most recent diagnostic guidelines, risk factor reduction with self-care instructions and medication management.    Nutrition I class: Heart Healthy Eating:  -Group instruction provided by PowerPoint slides, verbal discussion, and written materials to support subject matter. The instructor gives an explanation and review of the Therapeutic Lifestyle Changes diet recommendations, which includes a discussion on lipid goals, dietary fat, sodium, fiber, plant stanol/sterol esters, sugar, and the components of a well-balanced, healthy diet.   Nutrition II class: Lifestyle Skills:  -Group instruction provided by PowerPoint slides, verbal discussion, and written materials to support subject matter. The instructor gives an explanation and review of label reading, grocery shopping for heart health, heart healthy recipe modifications, and ways to make healthier choices when eating out.   Diabetes Question & Answer:  -Group instruction provided by PowerPoint slides, verbal discussion, and written materials to support subject matter. The instructor gives an explanation and review of diabetes co-morbidities, pre- and post-prandial blood glucose goals, pre-exercise blood glucose goals, signs, symptoms, and treatment of hypoglycemia and hyperglycemia, and foot care basics.   Diabetes Blitz:  -Group instruction provided by PowerPoint slides, verbal discussion, and written materials to support subject matter. The instructor gives an explanation and review of the physiology behind type 1 and type 2 diabetes, diabetes medications and rational behind using different medications, pre- and post-prandial blood glucose recommendations and Hemoglobin A1c goals, diabetes  diet, and exercise including blood glucose guidelines for exercising safely.    Portion Distortion:  -Group instruction provided by PowerPoint slides, verbal discussion, written materials, and food models to support subject matter. The instructor gives an explanation of serving size versus portion size, changes in portions sizes over the last 20 years, and what consists of a serving from each food group.   Stress Management:  -Group instruction provided by verbal instruction, video, and written materials to support subject matter.  Instructors review role of stress in heart disease and how to cope with stress positively.     Exercising on Your Own:  -Group instruction provided by verbal instruction, power point, and written materials to support subject.  Instructors discuss benefits of exercise, components of exercise, frequency and intensity of exercise, and end points for exercise.  Also discuss use of nitroglycerin and activating EMS.  Review options of places to exercise outside  of rehab.  Review guidelines for sex with heart disease.   Cardiac Drugs I:  -Group instruction provided by verbal instruction and written materials to support subject.  Instructor reviews cardiac drug classes: antiplatelets, anticoagulants, beta blockers, and statins.  Instructor discusses reasons, side effects, and lifestyle considerations for each drug class.   Cardiac Drugs II:  -Group instruction provided by verbal instruction and written materials to support subject.  Instructor reviews cardiac drug classes: angiotensin converting enzyme inhibitors (ACE-I), angiotensin II receptor blockers (ARBs), nitrates, and calcium channel blockers.  Instructor discusses reasons, side effects, and lifestyle considerations for each drug class.   Anatomy and Physiology of the Circulatory System:  Group verbal and written instruction and models provide basic cardiac anatomy and physiology, with the coronary electrical and  arterial systems. Review of: AMI, Angina, Valve disease, Heart Failure, Peripheral Artery Disease, Cardiac Arrhythmia, Pacemakers, and the ICD.   Other Education:  -Group or individual verbal, written, or video instructions that support the educational goals of the cardiac rehab program.   Knowledge Questionnaire Score:     Knowledge Questionnaire Score - 08/26/16 1543      Knowledge Questionnaire Score   Pre Score 22/24      Core Components/Risk Factors/Patient Goals at Admission:     Personal Goals and Risk Factors at Admission - 08/26/16 1419      Core Components/Risk Factors/Patient Goals on Admission    Weight Management Yes;Obesity;Weight Maintenance;Weight Loss   Intervention Weight Management: Develop a combined nutrition and exercise program designed to reach desired caloric intake, while maintaining appropriate intake of nutrient and fiber, sodium and fats, and appropriate energy expenditure required for the weight goal.;Weight Management: Provide education and appropriate resources to help participant work on and attain dietary goals.;Weight Management/Obesity: Establish reasonable short term and long term weight goals.;Obesity: Provide education and appropriate resources to help participant work on and attain dietary goals.   Expected Outcomes Short Term: Continue to assess and modify interventions until short term weight is achieved;Long Term: Adherence to nutrition and physical activity/exercise program aimed toward attainment of established weight goal;Weight Maintenance: Understanding of the daily nutrition guidelines, which includes 25-35% calories from fat, 7% or less cal from saturated fats, less than 238m cholesterol, less than 1.5gm of sodium, & 5 or more servings of fruits and vegetables daily;Weight Loss: Understanding of general recommendations for a balanced deficit meal plan, which promotes 1-2 lb weight loss per week and includes a negative energy balance of  [phone] kcal/d;Understanding recommendations for meals to include 15-35% energy as protein, 25-35% energy from fat, 35-60% energy from carbohydrates, less than 2027mof dietary cholesterol, 20-35 gm of total fiber daily;Understanding of distribution of calorie intake throughout the day with the consumption of 4-5 meals/snacks   Hypertension Yes   Intervention Provide education on lifestyle modifcations including regular physical activity/exercise, weight management, moderate sodium restriction and increased consumption of fresh fruit, vegetables, and low fat dairy, alcohol moderation, and smoking cessation.;Monitor prescription use compliance.   Expected Outcomes Short Term: Continued assessment and intervention until BP is < 140/9072mG in hypertensive participants. < 130/58m43m in hypertensive participants with diabetes, heart failure or chronic kidney disease.;Long Term: Maintenance of blood pressure at goal levels.   Lipids Yes   Intervention Provide education and support for participant on nutrition & aerobic/resistive exercise along with prescribed medications to achieve LDL <70mg54mL >40mg.64mxpected Outcomes Short Term: Participant states understanding of desired cholesterol values and is compliant with medications prescribed. Participant is following  exercise prescription and nutrition guidelines.;Long Term: Cholesterol controlled with medications as prescribed, with individualized exercise RX and with personalized nutrition plan. Value goals: LDL < 49m, HDL > 40 mg.      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  Would like pt to attend more consitently. 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  Would like pt to attend more consistently.    Expected Outcomes Pt achieves and/or make progress toward his desired weight, improvement of blood lipid levels and bp readings within normal limits Pt achieves and/or make progress toward his desired weight, improvement of blood lipid levels and bp readings within normal limits Pt achieves and/or make progress toward his desired weight, improvement of blood lipid levels and bp readings within normal limits Pt will participate in Cardiac Rehab, nutrition and lifestlye modification education to decrease overall risk factors.       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  Would like pt to attend more consistently.   Expected Outcomes Pt will participate in Cardiac Rehab, nutrition and lifestlye modification education to decrease overall risk factors.      ITP Comments:     ITP Comments    Row Name 08/26/16 1407 12/07/16 1616         ITP Comments Dr. TFransico Him Medical Director Dr. TFransico Him Medical Director         Comments:  Gene is making expected progress toward personal goals after completing 17 sessions since beginning in 07/2816.  Pt will graduate this week .Psychosocial Assessment -Pt  exhibits positive coping skills, hopeful outlook with supportive family. No psychosocial needs identified at this time, no psychosocial interventions necessary. Recommend continued exercise and life style modification education including stress management and relaxation techniques to decrease cardiac risk profile. CCherre Huger BSN Cardiac and PTraining and development officer

## 2016-12-07 NOTE — Addendum Note (Signed)
Encounter addended by: Dorna Bloom D on: 12/07/2016  4:07 PM<BR>    Actions taken: Flowsheet accepted, Flowsheet data copied forward, Visit Navigator Flowsheet section accepted

## 2016-12-08 ENCOUNTER — Other Ambulatory Visit: Payer: Self-pay | Admitting: Rheumatology

## 2016-12-08 ENCOUNTER — Encounter (HOSPITAL_COMMUNITY)
Admission: RE | Admit: 2016-12-08 | Discharge: 2016-12-08 | Disposition: A | Discharge status: Home / Self Care | Payer: Medicare Other | Source: Ambulatory Visit | Attending: Cardiovascular Disease | Admitting: Cardiovascular Disease

## 2016-12-08 ENCOUNTER — Other Ambulatory Visit: Payer: Self-pay

## 2016-12-08 ENCOUNTER — Other Ambulatory Visit: Payer: Self-pay | Admitting: *Deleted

## 2016-12-08 VITALS — Ht 73.0 in | Wt 233.7 lb

## 2016-12-08 DIAGNOSIS — I208 Other forms of angina pectoris: Secondary | ICD-10-CM | POA: Diagnosis not present

## 2016-12-08 DIAGNOSIS — Z5181 Encounter for therapeutic drug level monitoring: Secondary | ICD-10-CM | POA: Diagnosis not present

## 2016-12-08 DIAGNOSIS — I2089 Other forms of angina pectoris: Secondary | ICD-10-CM

## 2016-12-08 DIAGNOSIS — N400 Enlarged prostate without lower urinary tract symptoms: Secondary | ICD-10-CM | POA: Diagnosis not present

## 2016-12-08 DIAGNOSIS — M1990 Unspecified osteoarthritis, unspecified site: Secondary | ICD-10-CM | POA: Diagnosis not present

## 2016-12-08 DIAGNOSIS — Z7901 Long term (current) use of anticoagulants: Secondary | ICD-10-CM | POA: Diagnosis not present

## 2016-12-08 DIAGNOSIS — D649 Anemia, unspecified: Secondary | ICD-10-CM

## 2016-12-08 DIAGNOSIS — D472 Monoclonal gammopathy: Secondary | ICD-10-CM

## 2016-12-08 DIAGNOSIS — Z79899 Other long term (current) drug therapy: Secondary | ICD-10-CM | POA: Diagnosis not present

## 2016-12-08 DIAGNOSIS — I350 Nonrheumatic aortic (valve) stenosis: Secondary | ICD-10-CM | POA: Diagnosis not present

## 2016-12-08 MED ORDER — HYDROCODONE-ACETAMINOPHEN 10-325 MG PO TABS: 1.0000 | ORAL_TABLET | Freq: Two times a day (BID) | ORAL | 0 refills | Status: DC | PRN

## 2016-12-08 MED FILL — HYDROCODON-APAP 10-325: 10-325 | 50 days supply | Qty: 100 | Fill #0

## 2016-12-08 NOTE — Telephone Encounter (Signed)
Patient requesting refill on Hydrocodone. Patient will be here at 12:30.

## 2016-12-08 NOTE — Telephone Encounter (Signed)
ok 

## 2016-12-08 NOTE — Telephone Encounter (Signed)
Last Visit: 09/28/16 Next Visit due January 2019. Message sent to the front to schedule patient, UDS: 04/30/16  Narc Agreement: 06/20/16  Patient to update UDS while picking up prescription.   Okay to refill Hydrocodone?

## 2016-12-10 ENCOUNTER — Encounter (HOSPITAL_COMMUNITY)
Admission: RE | Admit: 2016-12-10 | Discharge: 2016-12-10 | Disposition: A | Discharge status: Home / Self Care | Payer: Medicare Other | Source: Ambulatory Visit | Attending: Cardiovascular Disease | Admitting: Cardiovascular Disease

## 2016-12-10 DIAGNOSIS — Z23 Encounter for immunization: Secondary | ICD-10-CM | POA: Diagnosis not present

## 2016-12-10 DIAGNOSIS — Z7901 Long term (current) use of anticoagulants: Secondary | ICD-10-CM | POA: Diagnosis not present

## 2016-12-10 DIAGNOSIS — N400 Enlarged prostate without lower urinary tract symptoms: Secondary | ICD-10-CM | POA: Diagnosis not present

## 2016-12-10 DIAGNOSIS — M1990 Unspecified osteoarthritis, unspecified site: Secondary | ICD-10-CM | POA: Diagnosis not present

## 2016-12-10 DIAGNOSIS — I2089 Other forms of angina pectoris: Secondary | ICD-10-CM

## 2016-12-10 DIAGNOSIS — I350 Nonrheumatic aortic (valve) stenosis: Secondary | ICD-10-CM | POA: Diagnosis not present

## 2016-12-10 DIAGNOSIS — I208 Other forms of angina pectoris: Secondary | ICD-10-CM | POA: Diagnosis not present

## 2016-12-10 DIAGNOSIS — Z79899 Other long term (current) drug therapy: Secondary | ICD-10-CM | POA: Diagnosis not present

## 2016-12-10 NOTE — Progress Notes (Signed)
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 from 08/26/2016 in [place]  Referring Provider  Sherren Mocha MD       Number of Visits: 19 sessions over 18 week period of time  Reason for Discharge:  Patient reached a stable level of exercise.  Smoking History:  History  Smoking Status  . Never Smoker  Smokeless Tobacco  . Never Used    Diagnosis:  Stable angina ([place])  ADL UCSD:   Initial Exercise Prescription:     Initial Exercise Prescription - 08/26/16 1500      Date of Initial Exercise RX and Referring Provider   Date 08/26/16   Referring Provider Sherren Mocha MD     Treadmill   MPH 2.8   Grade 1   Minutes 10   METs 3     Recumbant Bike   Level 2   Minutes 10   METs 3     NuStep   Level 3   Minutes 10   METs 3     Prescription Details   Frequency (times per week) 3   Duration Progress to 45 minutes of aerobic exercise without signs/symptoms of physical distress     Intensity   THRR 40-80% of Max Heartrate 56-115   Ratings of Perceived Exertion 11-13   Perceived Dyspnea 0-4     Progression   Progression Continue to progress workloads to maintain intensity without signs/symptoms of physical distress.     Resistance Training   Training Prescription Yes   Weight 2   Reps 10-15      Discharge Exercise Prescription (Final Exercise Prescription Changes):     Exercise Prescription Changes - 11/29/16 1557      Response to Exercise   Blood Pressure (Admit) 124/70   Blood Pressure (Exercise) 122/68   Blood Pressure (Exit) 104/60   Heart Rate (Admit) 73 bpm   Heart Rate (Exercise) 109 bpm   Heart Rate (Exit) 72 bpm   Rating of Perceived Exertion (Exercise) 11   Symptoms none   Duration Continue with 30 min of aerobic exercise without signs/symptoms of physical distress.    Intensity THRR unchanged     Progression   Progression Continue to progress workloads to maintain intensity without signs/symptoms of physical distress.   Average METs 4.1     Resistance Training   Training Prescription No     Treadmill   MPH 2.6   Grade 2   Minutes 10   METs 3.71     Recumbant Bike   Level 4   Minutes 10   METs 3.8     NuStep   Level 4   Minutes 10   METs 4.8     Home Exercise Plan   Plans to continue exercise at Home (comment)  home gym   Frequency Add 2 additional days to program exercise sessions.   Initial Home Exercises Provided 09/15/16      Functional Capacity:     6 Minute Walk    Row Name 08/26/16 1544 12/09/16 1412       6 Minute Walk   Phase Initial Discharge    Distance 1821 feet 1904 feet    Distance % Change  - 4.56 %    Distance Feet Change  - 83 ft    Walk Time 6 minutes 6 minutes    #  of Rest Breaks 0 0    MPH 3.4 3.61    METS 3.3 3.48    RPE 11 9    Perceived Dyspnea   - 0    VO2 Peak 11.5 12.2    Symptoms No Yes (comment)    Comments  - Pt had chest pain 2/10. Resolved with rest. History of stable angina    Resting HR 73 bpm 70 bpm    Resting BP 142/80 104/78    Max Ex. HR 96 bpm 109 bpm    Max Ex. BP 148/88 146/78    2 Minute Post BP 118/78 122/68       Psychological, QOL, Others - Outcomes: PHQ 2/9: Depression screen [facility] 2/9 09/03/2016 04/12/2016 04/15/2015 11/26/2013  Decreased Interest 0 0 0 0  Down, Depressed, Hopeless 0 0 0 0  PHQ - 2 Score 0 0 0 0  Some recent data might be hidden    Quality of Life:     Quality of Life - 12/09/16 1411      Quality of Life Scores   Health/Function Pre 18.57 %   Health/Function Post 20.25 %   Health/Function % Change 9.05 %   Socioeconomic Pre 28.75 %   Socioeconomic Post 26.63 %   Socioeconomic % Change  -7.37 %   Psych/Spiritual Pre 25 %   Psych/Spiritual Post 24 %   Psych/Spiritual % Change -4 %   Family Pre 22.8 %   Family Post 20 %   Family % Change  -12.28 %   GLOBAL Pre 22.55 %   GLOBAL Post 22 %   GLOBAL % Change -2.44 %      Personal Goals: Goals established at orientation with interventions provided to work toward goal.     Personal Goals and Risk Factors at Admission - 08/26/16 1419      Core Components/Risk Factors/Patient Goals on Admission    Weight Management Yes;Obesity;Weight Maintenance;Weight Loss   Intervention Weight Management: Develop a combined nutrition and exercise program designed to reach desired caloric intake, while maintaining appropriate intake of nutrient and fiber, sodium and fats, and appropriate energy expenditure required for the weight goal.;Weight Management: Provide education and appropriate resources to help participant work on and attain dietary goals.;Weight Management/Obesity: Establish reasonable short term and long term weight goals.;Obesity: Provide education and appropriate resources to help participant work on and attain dietary goals.   Expected Outcomes Short Term: Continue to assess and modify interventions until short term weight is achieved;Long Term: Adherence to nutrition and physical activity/exercise program aimed toward attainment of established weight goal;Weight Maintenance: Understanding of the daily nutrition guidelines, which includes 25-35% calories from fat, 7% or less cal from saturated fats, less than 22m cholesterol, less than 1.5gm of sodium, & 5 or more servings of fruits and vegetables daily;Weight Loss: Understanding of general recommendations for a balanced deficit meal plan, which promotes 1-2 lb weight loss per week and includes a negative energy balance of [phone] kcal/d;Understanding recommendations for meals to include 15-35% energy as protein, 25-35% energy from fat, 35-60% energy from carbohydrates, less than 2060mof dietary cholesterol, 20-35 gm of total fiber daily;Understanding of distribution of calorie intake throughout the day with the consumption of 4-5  meals/snacks   Hypertension Yes   Intervention Provide education on lifestyle modifcations including regular physical activity/exercise, weight management, moderate sodium restriction and increased consumption of fresh fruit, vegetables, and low fat dairy, alcohol moderation, and smoking cessation.;Monitor prescription use compliance.   Expected Outcomes Short Term: Continued  assessment and intervention until BP is < 140/70m HG in hypertensive participants. < 130/846mHG in hypertensive participants with diabetes, heart failure or chronic kidney disease.;Long Term: Maintenance of blood pressure at goal levels.   Lipids Yes   Intervention Provide education and support for participant on nutrition & aerobic/resistive exercise along with prescribed medications to achieve LDL <7064mHDL >4m44m Expected Outcomes Short Term: Participant states understanding of desired cholesterol values and is compliant with medications prescribed. Participant is following exercise prescription and nutrition guidelines.;Long Term: Cholesterol controlled with medications as prescribed, with individualized exercise RX and with personalized nutrition plan. Value goals: LDL < 70mg38mL > 40 mg.       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  Would like pt to attend more consitently. Pt  is a retired physiEngineer, drillingis making some progress toward achieving risk factor reduction for CAD.  Would like pt to attend more consistently.    Expected Outcomes Pt achieves and/or make progress toward his desired weight, improvement of blood lipid levels and bp readings within normal limits Pt achieves and/or make progress toward his desired weight, improvement of blood lipid levels and bp readings within normal limits Pt achieves and/or make progress toward his desired weight, improvement of blood lipid levels and bp readings within normal limits Pt will participate in Cardiac Rehab, nutrition and lifestlye modification education to decrease overall risk factors.       Exercise Goals and Review:     Exercise Goals    Row Name 08/26/16 1413             Exercise Goals   Increase Physical Activity Yes       Intervention Provide advice, education, support and counseling about physical activity/exercise needs.;Develop an individualized exercise prescription for aerobic and resistive training based on initial evaluation findings, risk stratification, comorbidities and participant's personal goals.       Expected Outcomes Achievement of increased cardiorespiratory fitness and enhanced flexibility, muscular endurance and strength shown through measurements of functional capacity and personal statement of participant.       Increase Strength and Stamina Yes       Intervention Provide advice, education, support and counseling about physical activity/exercise needs.;Develop an individualized exercise prescription for aerobic and resistive training based on initial evaluation findings, risk stratification, comorbidities and participant's personal goals.       Expected Outcomes Achievement of increased cardiorespiratory fitness and enhanced flexibility, muscular endurance and strength shown through measurements of functional capacity and personal statement of participant.          Nutrition & Weight  - Outcomes:     Pre Biometrics - 08/26/16 1607      Pre Biometrics   Height _0  (1.854 m)   Weight 231 lb 4.2 oz (104.9 kg)   Waist Circumference 43 inches   Hip Circumference 45.5 inches   Waist to Hip Ratio 0.95 %  BMI (Calculated) 30.6   Triceps Skinfold 15 mm   % Body Fat 29.8 %   Grip Strength 43 kg   Flexibility 7 in   Single Leg Stand 18.9 seconds         Post Biometrics - 12/09/16 1421       Post  Biometrics   Height _0  (1.854 m)   Weight 233 lb 11 oz (106 kg)   Waist Circumference 42.5 inches   Hip Circumference 45.5 inches   Waist to Hip Ratio 0.93 %   BMI (Calculated) 30.84   Triceps Skinfold 20 mm   % Body Fat 30.9 %   Grip Strength 44 kg   Flexibility 8 in   Single Leg Stand 30 seconds      Nutrition:     Nutrition Therapy & Goals - 08/26/16 1546      Nutrition Therapy   Diet Therapeutic Lifestyle Changes     Personal Nutrition Goals   Nutrition Goal Wt loss of 1-2 lb/week to a wt loss goal of 6-24 lb at graduation from [place].      Intervention Plan   Intervention Prescribe, educate and counsel regarding individualized specific dietary modifications aiming towards targeted core components such as weight, hypertension, lipid management, diabetes, heart failure and other comorbidities.   Expected Outcomes Short Term Goal: Understand basic principles of dietary content, such as calories, fat, sodium, cholesterol and nutrients.;Long Term Goal: Adherence to prescribed nutrition plan.      Nutrition Discharge:     Nutrition Assessments - 08/26/16 1545      MEDFICTS Scores   Pre Score 3      Education Questionnaire Score:     Knowledge Questionnaire Score - 12/09/16 1410      Knowledge Questionnaire Score   Post Score 23/24      Goals reviewed with patient. Pt graduated from cardiac rehab program today with completion of 19 exercise sessions in Phase II. Pt maintained good attendance and progressed nicely during his  participation in rehab as evidenced by increased MET level.   Medication list reconciled. Repeat  PHQ score-0. Pt completed post assessment quality of life survey.  Pt scored the following:     Quality of Life - 12/09/16 1411      Quality of Life Scores   Health/Function Pre 18.57 %   Health/Function Post 20.25 %   Health/Function % Change 9.05 %   Socioeconomic Pre 28.75 %   Socioeconomic Post 26.63 %   Socioeconomic % Change  -7.37 %   Psych/Spiritual Pre 25 %   Psych/Spiritual Post 24 %   Psych/Spiritual % Change -4 %   Family Pre 22.8 %   Family Post 20 %   Family % Change -12.28 %   GLOBAL Pre 22.55 %   GLOBAL Post 22 %   GLOBAL % Change -2.44 %     Pt feels he has achieved his goals during cardiac rehab. Pt wanted to see what his heart rhythm would be during exercise.   Pt plans to continue exercise at his home gym with weights and gym equipment. Pt was a joy to have in the exercise program!

## 2016-12-11 NOTE — Progress Notes (Signed)
C/w

## 2016-12-12 LAB — PAIN MGMT, PROFILE 5 W/CONF, U
ALPHAHYDROXYALPRAZOLAM: NEGATIVE ng/mL (ref <25)
AMINOCLONAZEPAM: NEGATIVE ng/mL (ref <25)
AMOBARBITAL: NEGATIVE ng/mL (ref <100)
Alphahydroxymidazolam: NEGATIVE ng/mL (ref <50)
Alphahydroxytriazolam: NEGATIVE ng/mL (ref <50)
Amphetamines: NEGATIVE ng/mL (ref <500)
BARBITURATES: POSITIVE ng/mL — AB (ref <300)
BENZODIAZEPINES: POSITIVE ng/mL — AB (ref <100)
BUTALBITAL: 2369 ng/mL — AB (ref <100)
COCAINE METABOLITE: NEGATIVE ng/mL (ref <150)
CREATININE: 75.7 mg/dL
Codeine: NEGATIVE ng/mL (ref <50)
HYDROCODONE: 214 ng/mL — AB (ref <50)
HYDROMORPHONE: 218 ng/mL — AB (ref <50)
Hydroxyethylflurazepam: NEGATIVE ng/mL (ref <50)
LORAZEPAM: 430 ng/mL — AB (ref <50)
Marijuana Metabolite: NEGATIVE ng/mL (ref <20)
Methadone Metabolite: NEGATIVE ng/mL (ref <100)
Morphine: NEGATIVE ng/mL (ref <50)
NORHYDROCODONE: 704 ng/mL — AB (ref <50)
Nordiazepam: NEGATIVE ng/mL (ref <50)
Opiates: POSITIVE ng/mL — AB (ref <100)
Oxazepam: NEGATIVE ng/mL (ref <50)
Oxidant: NEGATIVE ug/mL (ref <200)
Oxycodone: NEGATIVE ng/mL (ref <100)
PH: 6.5 (ref 4.5–9.0)
Pentobarbital: NEGATIVE ng/mL (ref <100)
Phenobarbital: NEGATIVE ng/mL (ref <100)
SECOBARBITAL: NEGATIVE ng/mL (ref <100)
TEMAZEPAM: NEGATIVE ng/mL (ref <50)

## 2016-12-13 NOTE — Progress Notes (Signed)
C/w

## 2016-12-28 ENCOUNTER — Encounter: Payer: Medicare Other | Admitting: *Deleted

## 2016-12-31 ENCOUNTER — Encounter: Payer: Self-pay | Admitting: Cardiology

## 2017-01-10 ENCOUNTER — Other Ambulatory Visit: Payer: Self-pay | Admitting: Rheumatology

## 2017-01-10 ENCOUNTER — Other Ambulatory Visit: Payer: Self-pay | Admitting: Internal Medicine

## 2017-01-10 DIAGNOSIS — I480 Paroxysmal atrial fibrillation: Secondary | ICD-10-CM

## 2017-01-10 MED FILL — METOPROLOL SUCC ER 100 MG T: 100 | 90 days supply | Qty: 180 | Fill #0

## 2017-01-10 MED FILL — LORazepam 1 MG TABS: 1 | 60 days supply | Qty: 60 | Fill #1

## 2017-01-10 MED FILL — DICLOFENAC 1.5% TOPICAL SOL: 1.5 | 10 days supply | Qty: 150 | Fill #1

## 2017-01-10 NOTE — Telephone Encounter (Signed)
Last Visit: 09/28/16 Next Visit: due January 2019. Message sent to the front to schedule patient, UDS: 12/08/16 Narc Agreement: 06/20/16  Okay to refill Hydrocodone?

## 2017-01-10 NOTE — Telephone Encounter (Signed)
ok 

## 2017-01-12 ENCOUNTER — Encounter: Payer: Self-pay | Admitting: Neurology

## 2017-01-12 ENCOUNTER — Other Ambulatory Visit: Payer: Self-pay

## 2017-01-12 ENCOUNTER — Ambulatory Visit (INDEPENDENT_AMBULATORY_CARE_PROVIDER_SITE_OTHER): Payer: Medicare Other | Admitting: Neurology

## 2017-01-12 VITALS — BP 98/48 | HR 106 | Ht 73.0 in | Wt 222.0 lb

## 2017-01-12 DIAGNOSIS — M542 Cervicalgia: Secondary | ICD-10-CM

## 2017-01-12 DIAGNOSIS — I208 Other forms of angina pectoris: Secondary | ICD-10-CM

## 2017-01-12 MED ORDER — TIZANIDINE HCL 2 MG PO TABS: 2.0000 mg | ORAL_TABLET | Freq: Every evening | ORAL | 5 refills | Status: DC | PRN

## 2017-01-12 MED FILL — BUTALB-ACETAMIN-CAFF 50-325: 50-325-40 | 90 days supply | Qty: 180 | Fill #0

## 2017-01-12 MED FILL — tiZANidine HCL 4 MG TABS: 4 | 30 days supply | Qty: 15 | Fill #0

## 2017-01-12 NOTE — Patient Instructions (Signed)
Start tizanidine 2mg  at bedtime  We will set you up from neck physiotherapy

## 2017-01-12 NOTE — Progress Notes (Signed)
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  Medical comorbidities include hypertension, amiodarone-induced hypothyroidism and pulmonary fibrosis, cerebellar stroke (1989, age 62), sinus node dysfunction s/p PPM, CAD s/p multiple interventions, PAF on chronic anticoagulation with Eliquis, and is s/p AVR for aortic stenosis with MAZE.  History of present illness: He reports having history of fluctuating and fatigable bilateral ptosis, greater on the right, and binocular diplopia since 2010.  In 2014, his acetylcholine receptor antibodies returned negative.  Because of high clinical suspicion for myasthenia, he was referred to [facility] for SFEMG which showed jitter in the left orbicularis oculi.  The diagnosis of seronegative myasthenia gravis was made and he was started on mestinon 30-60mg  TID, however, admits that he did not take this very long.  CT chest was negative for thymoma.  Over the years, he has simply learned to compensate with his vertical diplopia and ptosis and remained off any medications. There has been no change in his ocular symptoms since onset.  He does not have any difficulty with day-to-day activities, including driving.   Recently, he began having increased dyspnea with exertion and greater bilateral leg fatigue and achy pain, which led to him thinking that his myasthenia may be getting worse.  He was able to walk a greater distance, but now gets very short of breath and needs to rest.  With this said, he also acknowledges his significant cardiac comorbidities and is unsure whether his MG is getting worse or if his symptoms are stemming from cardiac and pulmonary disease.  He had mild dysphagia and had barium swallow which showed slowed peristalsis.  He has a reclining bed and tends  to keep the head up, but this has been the same for many years.   He does not have slurred speech, proximal weakness, or falls.    He is mostly bothered by his cardiac arrhythmias, especially when he reverts back into atrial flutter with tachycardia because it causes chest tightness and angina.  Because this can be triggered by a number of things, he has been less active as to avoid increasing his heart rate and does feel deconditioned.  He also has significant arthritis involving his neck, shoulder, hands, hips, and knees for which he sees Dr. Estanislado Pandy.  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  He tried prednisone 20mg  for about 10-days without benefit, so self tapering it back to 10mg  daily.  He denies any new neurological symptoms.  Unfortunately, he had an interval ER visit on 11/20/2015 for afib/flutter with RVR which was medically managed.  He will be going to [facility] for evaluation for cardiac ablation.    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  He has long history of OA and is followed by Dr. Estanislado Pandy.  He is cautious with NSAIDs due to being on Eliquis and treats pain with various analgesic patches and hydrocodone. He is mostly bothered by neck stiffness and pain with walking and feels that using a neck collar to support his head may alleviate some pain.  He denies any radicular pain into the arms or paresthesias. He has been taking fioricet twice daily for many years for tension  headache and neck pain.  He continues to have double vision secondary to ocular myasthenia gravis and did not benefit with prednisone or mestinon in the past.    Medications:  Current Outpatient Medications on File Prior to Visit  Medication Sig Dispense Refill  . apixaban (ELIQUIS) 5 MG TABS tablet Take 1 tablet (5 mg total) by mouth 2 (two) times daily. 180 tablet 3  .  bisacodyl (DULCOLAX) 5 MG EC tablet Take 5 mg by mouth daily as needed (constipation).     . butalbital-acetaminophen-caffeine (FIORICET, ESGIC) 50-325-40 MG per tablet Take 1 tablet by mouth 2 (two) times daily as needed (osteo-arthritis pain).     . Cholecalciferol (VITAMIN D3) 2000 units capsule Take 1 capsule by mouth daily.    . Coenzyme Q10 (COQ10) 100 MG CAPS Take 100 mg by mouth daily.    . Diclofenac Sodium 1.5 % SOLN Take 1 application by mouth 2 (two) times daily as needed (Knee pain).     Marland Kitchen doxazosin (CARDURA) 4 MG tablet Take 4 mg by mouth at bedtime.     Marland Kitchen HYDROcodone-acetaminophen (NORCO) 10-325 MG tablet Take 1 tablet by mouth 2 (two) times daily as needed. 100 tablet 0  . HYDROcodone-acetaminophen (NORCO) 10-325 MG tablet TAKE 1 TABLET BY MOUTH TWICE A DAY AS NEEDED 100 tablet 0  . LORazepam (ATIVAN) 1 MG tablet Take 1 mg by mouth at bedtime.     . magnesium oxide (MAG-OX) 400 MG tablet Take 400 mg by mouth daily.    . metoprolol succinate (TOPROL-XL) 100 MG 24 hr tablet Take one (1) tablet (100 mg) by mouth twice daily. 180 tablet 2  . nitroGLYCERIN (NITROSTAT) 0.4 MG SL tablet Place 1 tablet (0.4 mg total) under the tongue every 5 (five) minutes as needed for chest pain. 25 tablet 3  . OVER THE COUNTER MEDICATION Apply 1 patch topically daily as needed (neck and shoulder pain). Chinese pain patch    . potassium chloride SA (K-DUR,KLOR-CON) 20 MEQ tablet TAKE 1 TABLET BY MOUTH ONCE DAILY 90 tablet 3  . predniSONE (DELTASONE) 5 MG tablet Take 2.5-5 mg by mouth daily as needed.     . rosuvastatin (CRESTOR) 20 MG tablet TAKE 1 TABLET BY MOUTH AT BEDTIME. 90 tablet 2   No current facility-administered medications on file prior to visit.     Allergies:  Allergies  Allergen Reactions  . Contrast Media [Iodinated Diagnostic Agents] Hives  . Gadolinium Derivatives Hives  . Metrizamide Hives    Review of Systems:  CONSTITUTIONAL: No fevers, chills, night sweats, or weight loss.    EYES: No visual changes or eye pain ENT: No hearing changes.  No history of nose bleeds.   RESPIRATORY: No cough, wheezing and shortness of breath.   CARDIOVASCULAR: Negative for chest pain, and palpitations.   GI: Negative for abdominal discomfort, blood in stools or black stools.  No recent change in bowel habits.   GU:  No history of incontinence.   MUSCLOSKELETAL: +history of joint pain or swelling.  No myalgias.   SKIN: Negative for lesions, rash, and itching.   ENDOCRINE: Negative for cold or heat intolerance, polydipsia or goiter.   PSYCH:  No depression or anxiety symptoms.   NEURO: As Above.   Vital Signs:  BP (!) 98/48   Pulse (!) 106   Ht 6\' 1"  (1.854 m)   Wt 222 lb (100.7 kg)   SpO2 97%   BMI 29.29 kg/m   Neurological Exam: MENTAL STATUS including orientation  to time, place, person, recent and remote memory, attention span and concentration and fund of knowledge is excellent.  Speech is not dysarthric.  CRANIAL NERVES:  Face is symmetric.  He tends to keep the right eye closed due to diplopia. He has mild R ptosis at baseline.    MOTOR:  Motor strength is 5/5 in all extremities, including neck flexion and extension.  He has stiffness and tenderness over the cervical paraspinal muscles. Neck ROM is limited with rotation right and left and extension.   REFLEXES:  Reflexes are 2+/4 in the upper extremities.   COORDINATION/GAIT:  Gait narrow based and stable.   Data: CTA head 05/17/2012: Old left inferior cerebellar infarction. No abnormality of the cerebral hemispheres. Atherosclerosis of both carotid siphon regions without evidence of flow-limiting stenosis. Narrowing is no greater than about 25-30% in that region. The right vertebral artery is patent to the basilar. There is mild atherosclerotic narrowing of the distal 1 cm without evidence of a critical stenosis. The left vertebral artery is a tiny vessel. This could be antegrade flow, reconstituted flow or  retrograde flow. See above for full discussion. There is no evidence of a dominant or correctable posterior circulation stenosis.   IMPRESSION/PLAN: Cervicalgia due to OA and neck strain.  No features to suggest cervical radiculopathy or myelopathy  - Recommend starting tizanidine 2mg  at bedtime and titrate as needed  - Start neck physiotherapy  - OK to use soft neck collar only as needed to prevent neck fatigue and minimize pain, but hopefully strengthening and traction with neck PT will help  - Taper off fioricet as this will contribute to rebound headaches and pain  Seronegative ocular myasthenia gravis with exacerbation  - Diagnosed by [facility] at [facility] by Dr. Queen Slough in 2014  - No improvement with mestinon or prednisone 20mg  and he is prefers to manage this by covering one eye   - He does not wish to try any new medication for MG  Greater than 50% of this 25 minute visit was spent in counseling, explanation of diagnosis, planning of further management, and coordination of care.   Thank you for allowing me to participate in patient's care.  If I can answer any additional questions, I would be pleased to do so.    Sincerely,    Donika K. Posey Pronto, DO

## 2017-01-13 DIAGNOSIS — Z125 Encounter for screening for malignant neoplasm of prostate: Secondary | ICD-10-CM | POA: Diagnosis not present

## 2017-01-13 DIAGNOSIS — E039 Hypothyroidism, unspecified: Secondary | ICD-10-CM | POA: Diagnosis not present

## 2017-01-13 DIAGNOSIS — R51 Headache: Secondary | ICD-10-CM | POA: Diagnosis not present

## 2017-01-13 DIAGNOSIS — I480 Paroxysmal atrial fibrillation: Secondary | ICD-10-CM | POA: Diagnosis not present

## 2017-01-13 MED FILL — DOXYCYCLINE HYCLATE 100 MG: 100 | 15 days supply | Qty: 30 | Fill #0

## 2017-01-25 ENCOUNTER — Other Ambulatory Visit: Payer: Self-pay | Admitting: Neurology

## 2017-01-25 MED FILL — DOXYCYCLINE HYCLATE 100 MG: 100 | 15 days supply | Qty: 30 | Fill #1

## 2017-01-25 MED FILL — predniSONE 10 MG TABS: 10 | 30 days supply | Qty: 30 | Fill #0

## 2017-01-25 MED FILL — HYDROCODON-APAP 10-325: 10-325 | 50 days supply | Qty: 100 | Fill #0

## 2017-01-25 NOTE — Telephone Encounter (Signed)
Please advise 

## 2017-01-27 ENCOUNTER — Telehealth: Payer: Self-pay | Admitting: Neurology

## 2017-01-27 MED ORDER — PYRIDOSTIGMINE BROMIDE 60 MG PO TABS: 60.0000 mg | ORAL_TABLET | Freq: Three times a day (TID) | ORAL | 5 refills | Status: DC

## 2017-01-27 MED FILL — PYRIDOSTIGMINE BR 60 MG TAB: 60 | 30 days supply | Qty: 90 | Fill #0

## 2017-01-27 NOTE — Telephone Encounter (Signed)
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  No worsening double vision, droopy eyelid, dysphagia, dysarthria, or limb weakness.  He has many overlapping medical conditions which can predispose his to fatigue and neck stiffness/weakness, including known cardiac disease and arthritis.    Although it would be atypical for ocular myasthenia to generalize now (symptoms since 2010, diagnosed in 2014), I would like to offer a trial of prednisone 20mg  and mestinon 60mg  three times daily.  Lashayla Armes K. Posey Pronto, DO

## 2017-02-11 ENCOUNTER — Other Ambulatory Visit: Payer: Self-pay | Admitting: Rheumatology

## 2017-02-11 MED FILL — ELIQUIS 5 MG TABLET: 5 | 90 days supply | Qty: 180 | Fill #1

## 2017-02-11 MED FILL — ROSUVASTATIN CALCIUM 20 MG: 20 | 90 days supply | Qty: 90 | Fill #1 | Status: TO

## 2017-02-11 MED FILL — POTASSIUM CL ER 20 MEQ TAB: 20 | 90 days supply | Qty: 90 | Fill #1 | Status: TO

## 2017-02-11 MED FILL — DOXAZOSIN MESYLATE 4 MG TAB: 4 | 90 days supply | Qty: 90 | Fill #0

## 2017-02-11 NOTE — Telephone Encounter (Addendum)
Patient will be leaving to travel to [place] on 03/08/17 and will be gone for 3 weeks.   Last Visit: 09/28/16 Next Visit: due January 2019. Message sent to the front to schedule patient, UDS: 12/08/16 Narc Agreement: 06/20/16  Okay to refill Hydrocodone on 02/28/17?

## 2017-02-11 NOTE — Telephone Encounter (Signed)
ok 

## 2017-02-24 ENCOUNTER — Ambulatory Visit (INDEPENDENT_AMBULATORY_CARE_PROVIDER_SITE_OTHER): Payer: Medicare Other | Admitting: Internal Medicine

## 2017-02-24 ENCOUNTER — Encounter: Payer: Self-pay | Admitting: Internal Medicine

## 2017-02-24 VITALS — BP 155/80 | HR 80

## 2017-02-24 DIAGNOSIS — I480 Paroxysmal atrial fibrillation: Secondary | ICD-10-CM | POA: Diagnosis not present

## 2017-02-24 DIAGNOSIS — Z950 Presence of cardiac pacemaker: Secondary | ICD-10-CM | POA: Diagnosis not present

## 2017-02-24 DIAGNOSIS — I495 Sick sinus syndrome: Secondary | ICD-10-CM

## 2017-02-24 DIAGNOSIS — I484 Atypical atrial flutter: Secondary | ICD-10-CM

## 2017-02-24 DIAGNOSIS — I208 Other forms of angina pectoris: Secondary | ICD-10-CM

## 2017-02-24 LAB — CUP PACEART INCLINIC DEVICE CHECK
Date Time Interrogation Session: 20181227160455
Implantable Lead Implant Date: 20170417
Implantable Lead Location: 753860
Implantable Lead Model: 7740
Implantable Lead Serial Number: 662696
Implantable Lead Serial Number: 751382
MDC IDC LEAD IMPLANT DT: 20170417
MDC IDC LEAD LOCATION: 753859
MDC IDC PG IMPLANT DT: 20170417
MDC IDC PG SERIAL: 718418

## 2017-02-24 NOTE — Progress Notes (Signed)
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  Since I saw him last, he has been stable.  His palpitations have improved.  His main complaint is dyspnea and fatigue with exertion.  When he goes quickly up 2 flights of stairs, he gets severely winded and his legs get sore. Allergies  Allergen Reactions  . Contrast Media [Iodinated Diagnostic Agents] Hives  . Gadolinium Derivatives Hives  . Metrizamide Hives     Current Outpatient Medications  Medication Sig Dispense Refill  . apixaban (ELIQUIS) 5 MG TABS tablet Take 1 tablet (5 mg total) by mouth 2 (two) times daily. 180 tablet 3  . bisacodyl (DULCOLAX) 5 MG EC tablet Take 5 mg by mouth daily as needed (constipation).     . butalbital-acetaminophen-caffeine (FIORICET, ESGIC) 50-325-40 MG per tablet Take 1 tablet by mouth 2 (two) times daily as needed (osteo-arthritis pain).     . Cholecalciferol (VITAMIN D3) 2000 units capsule Take 1 capsule by mouth daily.    . Coenzyme Q10 (COQ10) 100 MG CAPS Take 100 mg by mouth daily.    . Diclofenac Sodium 1.5 % SOLN Take 1 application by mouth 2 (two) times daily as needed (Knee pain).     Marland Kitchen doxazosin (CARDURA) 4 MG tablet Take 4 mg by mouth at bedtime.     Marland Kitchen HYDROcodone-acetaminophen (NORCO) 10-325 MG tablet Take 1 tablet by mouth 2 (two) times daily as needed. 100 tablet 0  . HYDROcodone-acetaminophen (NORCO) 10-325 MG tablet TAKE 1 TABLET BY MOUTH TWICE A DAY AS NEEDED 100 tablet 0  . HYDROcodone-acetaminophen (NORCO) 10-325 MG tablet TAKE 1 TABLET BY MOUTH TWICE DAILY AS NEEDED 100 tablet 0  . LORazepam (ATIVAN) 1 MG tablet Take 1 mg by mouth at bedtime.     . magnesium oxide (MAG-OX)  400 MG tablet Take 400 mg by mouth daily.    . metoprolol succinate (TOPROL-XL) 100 MG 24 hr tablet Take one (1) tablet (100 mg) by mouth twice daily. 180 tablet 2  . nitroGLYCERIN (NITROSTAT) 0.4 MG SL tablet Place 1 tablet (0.4 mg total) under the tongue every 5 (five) minutes as needed for chest pain. 25 tablet 3  . OVER THE COUNTER MEDICATION Apply 1 patch topically daily as needed (neck and shoulder pain). Chinese pain patch    . potassium chloride SA (K-DUR,KLOR-CON) 20 MEQ tablet TAKE 1 TABLET BY MOUTH ONCE DAILY 90 tablet 3  . predniSONE (DELTASONE) 10 MG tablet TAKE 1 TABLET BY MOUTH ONCE DAILY WITH BREAKFAST 30 tablet 5  . predniSONE (DELTASONE) 5 MG tablet Take 2.5-5 mg by mouth daily as needed.     . pyridostigmine (MESTINON) 60 MG tablet Take 1 tablet (60 mg total) by mouth 3 (three) times daily. 90 tablet 5  . rosuvastatin (CRESTOR) 20 MG tablet TAKE 1 TABLET BY MOUTH AT BEDTIME. 90 tablet 2  . tiZANidine (ZANAFLEX) 2 MG tablet Take 1 tablet (2 mg total) at bedtime as needed by mouth for muscle spasms. 30 tablet 5   No current facility-administered medications for this visit.      Past Medical History:  Diagnosis Date  . Aortic stenosis    moderate aortic stenosis  . Arthritis   . Benign prostatic hypertrophy   . Chronotropic  incompetence with sinus node dysfunction (HCC)    Status post Guidant dual-mode, dual-pacing, dual-sensing  pacemaker   implantation now programmed to AAI with recent generator change.  . Coronary artery disease    status post multiple prior percutaneous coronary interventions, microvascular angina per Dr Olevia Perches  . Heart murmur   . Hypercoagulable state ([place])    chronically anticoagulated with coumadin  . Hyperlipidemia   . Hyperthyroidism   . Hypothyroidism    Dr. Elyse Hsu  . MGUS (monoclonal gammopathy of unknown significance) 02/17/2013  . Paroxysmal atrial fibrillation ([place])    DR. Lia Foyer,   . Stroke ([place])    1990    ROS:   All  systems reviewed and negative except as noted in the HPI.   Past Surgical History:  Procedure Laterality Date  . AORTIC VALVE REPLACEMENT  03/15/2011   Procedure: AORTIC VALVE REPLACEMENT (AVR);  Surgeon: Gaye Pollack, MD;  Location: [place];  Service: Open Heart Surgery;  Laterality: N/A;  . APPENDECTOMY    . CARDIAC CATHETERIZATION     13  . CARDIOVERSION    . CARDIOVERSION  04/15/2011   Procedure: CARDIOVERSION;  Surgeon: Loralie Champagne, MD;  Location: [place];  Service: Cardiovascular;  Laterality: N/A;  . CARDIOVERSION N/A 09/11/2014   Procedure: CARDIOVERSION;  Surgeon: Sueanne Margarita, MD;  Location: [facility] ENDOSCOPY;  Service: Cardiovascular;  Laterality: N/A;  . CARDIOVERSION N/A 06/27/2015   Procedure: CARDIOVERSION;  Surgeon: Thayer Headings, MD;  Location: [facility] ENDOSCOPY;  Service: Cardiovascular;  Laterality: N/A;  . CARDIOVERSION N/A 07/04/2015   Procedure: CARDIOVERSION;  Surgeon: Evans Lance, MD;  Location: [place];  Service: Cardiovascular;  Laterality: N/A;  . EP IMPLANTABLE DEVICE N/A 06/16/2015   Procedure: Pacemaker Implant;  Surgeon: Evans Lance, MD;  Location: [place] CV LAB;  Service: Cardiovascular;  Laterality: N/A;  . hemrrhoidectomy    . LEFT AND RIGHT HEART CATHETERIZATION WITH CORONARY ANGIOGRAM Bilateral 02/01/2011   Procedure: LEFT AND RIGHT HEART CATHETERIZATION WITH CORONARY ANGIOGRAM;  Surgeon: Hillary Bow, MD;  Location: [facility] CATH LAB;  Service: Cardiovascular;  Laterality: Bilateral;  . MAZE  03/15/2011   Procedure: MAZE;  Surgeon: Gaye Pollack, MD;  Location: [place];  Service: Open Heart Surgery;  Laterality: N/A;  . PACEMAKER INSERTION  1991   Guidant PPM, most recent Generator Change by Dr Olevia Perches was 08/22/06  . RIGHT/LEFT HEART CATH AND CORONARY ANGIOGRAPHY N/A 07/06/2016   Procedure: Right/Left Heart Cath and Coronary Angiography;  Surgeon: Sherren Mocha, MD;  Location: [place] CV LAB;  Service: Cardiovascular;  Laterality: N/A;  . TEE  WITHOUT CARDIOVERSION  04/15/2011   Procedure: TRANSESOPHAGEAL ECHOCARDIOGRAM (TEE);  Surgeon: Loralie Champagne, MD;  Location: [place];  Service: Cardiovascular;  Laterality: N/A;  . TEE WITHOUT CARDIOVERSION N/A 09/11/2014   Procedure: TRANSESOPHAGEAL ECHOCARDIOGRAM (TEE);  Surgeon: Sueanne Margarita, MD;  Location: [facility] ENDOSCOPY;  Service: Cardiovascular;  Laterality: N/A;     Family History  Problem Relation Age of Onset  . Heart disease Brother        Twin brother has coronary disease and recent AVR for AS  . Anesthesia problems Neg Hx   . Hypotension Neg Hx   . Malignant hyperthermia Neg Hx   . Pseudochol deficiency Neg Hx      Social History   Socioeconomic History  . Marital status: Married    Spouse name: Not on file  . Number of children: Not on file  . Years of education: Not  on file  . Highest education level: Not on file  Social Needs  . Financial resource strain: Not on file  . Food insecurity - worry: Not on file  . Food insecurity - inability: Not on file  . Transportation needs - medical: Not on file  . Transportation needs - non-medical: Not on file  Occupational History  . Not on file  Tobacco Use  . Smoking status: Never Smoker  . Smokeless tobacco: Never Used  Substance and Sexual Activity  . Alcohol use: No    Alcohol/week: 0.0 oz  . Drug use: No  . Sexual activity: Not on file  Other Topics Concern  . Not on file  Social History Narrative   Married. Has grown children   Retired Horticulturist, commercial MD        BP (!) 155/80   Pulse 80   Physical Exam:  Well appearing 82 year old man, NAD HEENT: Unremarkable Neck: 6 cm JVD, no thyromegally Lymphatics:  No adenopathy Back:  No CVA tenderness Lungs:  Clear, with no wheezes, rales, or rhonchi HEART:  IRegular rate rhythm, no murmurs, no rubs, no clicks Abd:  soft, positive bowel sounds, no organomegally, no rebound, no guarding Ext:  2 plus pulses, no edema, no cyanosis, no clubbing Skin:  No rashes  no nodules Neuro:  CN II through XII intact, motor grossly intact   DEVICE  Normal device function.  See PaceArt for details.  His device rate response was reprogrammed today in hopes of increasing his ventricular rates and his cardiac output.  Assess/Plan: 1.  Atrial fibrillation and flutter -his ventricular rate is well controlled based on his histogram data.  I have asked the patient to reduce his dose of Toprol from 100 mg twice a day to 100 mg in the morning and 50 mg in the evening.  We discussed rhythm control and specifically the antiarrhythmic drug dofetilide.  He is traveling out of the country for a couple of weeks and will return in late January.  We will consider inpatient admission for dofetilide at that time.  While long-term dofetilide therapy is in question, I would really like to see if he feels better in sinus rhythm, and if he does, consider catheter ablation. 2.  Hypertension -his blood pressure is at times low and at times high.  A few moments after walking up 2 flights of stairs today, his blood pressure was 150/95 on my exam.  I have asked the patient to try taking some additional diuretic therapy in hopes of improving his blood pressure. 3.  Chronic diastolic heart failure -his symptoms seem a bit worse today.  We will try additional diuretic therapy with Lasix as noted above.  If he is not improved after 3 or 4 days, then he will stop taking the Lasix. 4.  Pacemaker -we have interrogated his [place] pacemaker today.  His minute ventilation sensor was adjusted as well as his accelerometer.  He will undergo watchful waiting.  Crissie Sickles, MD

## 2017-02-24 NOTE — Patient Instructions (Addendum)
Medication Instructions:  Your physician has recommended you make the following change in your medication: 1.  Try taking furosemide in the morning for a few days to see if your dyspnea is better. 2.  Decrease your Toprol XL 100 mg to one tablet in the AM and 1/2 tablet in the PM.  If these changes are beneficial, have your med list updated at your upcoming visit with Dr. Burt Knack.  Labwork: None ordered.  Testing/Procedures: None ordered.  Follow-Up: Your physician wants you to follow-up in: 6-8 weeks with Dr. Lovena Le.  Remote monitoring is used to monitor your Pacemaker from home. This monitoring reduces the number of office visits required to check your device to one time per year. It allows [place] to keep an eye on the functioning of your device to ensure it is working properly. You are scheduled for a device check from home on 05/26/2017. You may send your transmission at any time that day. If you have a wireless device, the transmission will be sent automatically. After your physician reviews your transmission, you will receive a postcard with your next transmission date.  Any Other Special Instructions Will Be Listed Below (If Applicable).  If you need a refill on your cardiac medications before your next appointment, please call your pharmacy.

## 2017-02-25 MED FILL — predniSONE 10 MG TABS: 10 | 30 days supply | Qty: 30 | Fill #1

## 2017-03-04 ENCOUNTER — Ambulatory Visit (INDEPENDENT_AMBULATORY_CARE_PROVIDER_SITE_OTHER): Payer: Medicare Other | Admitting: Cardiovascular Disease

## 2017-03-04 ENCOUNTER — Encounter: Payer: Self-pay | Admitting: Cardiovascular Disease

## 2017-03-04 VITALS — BP 90/58 | HR 80 | Ht 73.0 in | Wt 220.0 lb

## 2017-03-04 DIAGNOSIS — I5032 Chronic diastolic (congestive) heart failure: Secondary | ICD-10-CM | POA: Diagnosis not present

## 2017-03-04 DIAGNOSIS — I2510 Atherosclerotic heart disease of native coronary artery without angina pectoris: Secondary | ICD-10-CM

## 2017-03-04 MED FILL — HYDROCODON-APAP 10-325: 10-325 | 50 days supply | Qty: 100 | Fill #0

## 2017-03-04 NOTE — Progress Notes (Signed)
Cardiology Office Note Date:  03/05/2017   ID:  Kevin Buff, MD, DOB [birth year], MRN [id number]  PCP:  [name], MD  Cardiologist:  Sherren Mocha, MD    Chief Complaint  Patient presents with  . Shortness of Breath    weakness  . 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  He continues to have shortness of breath with bending forward and with physical activity.  He is having a lot of dizziness especially with positional changes.  Notes that his blood pressure has been running  low at home, generally in the 90s.  Denies frank syncope.  Denies leg swelling.  No chest pain or pressure.  He is considering hospital admission for Tikosyn to try to restore sinus rhythm but has questions about the effectiveness of this as he has been out of rhythm for a long time and has significant left atrial enlargement.  He continues to tolerate anticoagulation without bleeding problems.  He is going to [place] 2 or 3 weeks in the near future and is going to consider hospital admission for Tikosyn when he gets back home.   Past Medical History:  Diagnosis Date  . Aortic stenosis    moderate aortic stenosis  . Arthritis   . Benign prostatic hypertrophy   . 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  . Coronary artery disease    status post multiple prior percutaneous coronary interventions, microvascular angina per Dr Olevia Perches  . Heart murmur   . Hypercoagulable state ([place])    chronically anticoagulated with coumadin  . Hyperlipidemia   . Hyperthyroidism   . Hypothyroidism    Dr. Elyse Hsu  . MGUS (monoclonal gammopathy of unknown significance) 02/17/2013  . Paroxysmal atrial fibrillation ([place])    DR. Lia Foyer,   . Stroke [facility])    1990    Past Surgical History:  Procedure Laterality Date  . AORTIC VALVE REPLACEMENT  03/15/2011   Procedure: AORTIC VALVE REPLACEMENT (AVR);  Surgeon: Gaye Pollack, MD;  Location: [facility];  Service: Open Heart Surgery;  Laterality: N/A;  . APPENDECTOMY    . CARDIAC CATHETERIZATION     11  . CARDIOVERSION    . CARDIOVERSION  04/15/2011   Procedure: CARDIOVERSION;  Surgeon: Loralie Champagne, MD;  Location: [place];  Service: Cardiovascular;  Laterality: N/A;  . CARDIOVERSION N/A 09/11/2014   Procedure: CARDIOVERSION;  Surgeon: Sueanne Margarita, MD;  Location: [facility] ENDOSCOPY;  Service: Cardiovascular;  Laterality: N/A;  . CARDIOVERSION N/A 06/27/2015   Procedure: CARDIOVERSION;  Surgeon: Thayer Headings, MD;  Location: [facility] ENDOSCOPY;  Service: Cardiovascular;  Laterality: N/A;  . CARDIOVERSION N/A 07/04/2015   Procedure: CARDIOVERSION;  Surgeon: Evans Lance, MD;  Location: [place];  Service: Cardiovascular;  Laterality: N/A;  . EP IMPLANTABLE DEVICE N/A 06/16/2015   Procedure: Pacemaker Implant;  Surgeon: Evans Lance, MD;  Location: [place] CV LAB;  Service: Cardiovascular;  Laterality: N/A;  . hemrrhoidectomy    . LEFT AND RIGHT HEART CATHETERIZATION WITH CORONARY ANGIOGRAM Bilateral 02/01/2011   Procedure: LEFT AND RIGHT HEART CATHETERIZATION WITH CORONARY ANGIOGRAM;  Surgeon: Hillary Bow, MD;  Location: [facility] CATH LAB;  Service: Cardiovascular;  Laterality: Bilateral;  . MAZE  03/15/2011   Procedure: MAZE;  Surgeon: Gaye Pollack, MD;  Location: [place];  Service: Open Heart Surgery;  Laterality: N/A;  . PACEMAKER INSERTION  1991   Guidant PPM, most recent Generator Change by Dr Olevia Perches was 08/22/06  . RIGHT/LEFT HEART CATH AND CORONARY ANGIOGRAPHY N/A 07/06/2016   Procedure: Right/Left Heart Cath and Coronary Angiography;  Surgeon: Sherren Mocha, MD;  Location: [place] CV LAB;  Service: Cardiovascular;  Laterality: N/A;  . TEE WITHOUT  CARDIOVERSION  04/15/2011   Procedure: TRANSESOPHAGEAL ECHOCARDIOGRAM (TEE);  Surgeon: Loralie Champagne, MD;  Location: [place];  Service: Cardiovascular;  Laterality: N/A;  . TEE WITHOUT CARDIOVERSION N/A 09/11/2014   Procedure: TRANSESOPHAGEAL ECHOCARDIOGRAM (TEE);  Surgeon: Sueanne Margarita, MD;  Location: [facility] ENDOSCOPY;  Service: Cardiovascular;  Laterality: N/A;    Current Outpatient Medications  Medication Sig Dispense Refill  . apixaban (ELIQUIS) 5 MG TABS tablet Take 1 tablet (5 mg total) by mouth 2 (two) times daily. 180 tablet 3  . bisacodyl (DULCOLAX) 5 MG EC tablet Take 5 mg by mouth daily as needed (constipation).     . butalbital-acetaminophen-caffeine (FIORICET, ESGIC) 50-325-40 MG per tablet Take 1 tablet by mouth 2 (two) times daily as needed (osteo-arthritis pain).     . Cholecalciferol (VITAMIN D3) 2000 units capsule Take 1 capsule by mouth daily.    . Coenzyme Q10 (COQ10) 100 MG CAPS Take 100 mg by mouth daily.    . Diclofenac Sodium 1.5 % SOLN Take 1 application by mouth 2 (two) times daily as needed (Knee pain).     Marland Kitchen doxazosin (CARDURA) 4 MG tablet Take 4 mg by mouth at bedtime.     Marland Kitchen HYDROcodone-acetaminophen (NORCO) 10-325 MG tablet Take 1 tablet by mouth 2 (two) times daily as needed. 100 tablet 0  . HYDROcodone-acetaminophen (NORCO) 10-325 MG tablet TAKE 1 TABLET BY MOUTH TWICE A DAY AS NEEDED 100 tablet 0  . HYDROcodone-acetaminophen (NORCO) 10-325 MG tablet TAKE 1 TABLET BY MOUTH TWICE DAILY AS NEEDED 100 tablet 0  . LORazepam (ATIVAN) 1 MG tablet Take 1 mg by mouth at bedtime.     . magnesium oxide (MAG-OX) 400 MG tablet Take 400 mg by mouth daily.    . metoprolol succinate (TOPROL-XL) 100 MG 24 hr tablet Take 100 mg by mouth daily. Take 50 mg by mouth at night. Take with or immediately following a meal.    . nitroGLYCERIN (NITROSTAT) 0.4 MG SL tablet Place 1 tablet (0.4 mg total) under the tongue every 5 (five) minutes as needed for chest pain. 25 tablet 3  . OVER  THE COUNTER MEDICATION Apply 1 patch topically daily as needed (neck and shoulder pain). Chinese pain patch    . potassium chloride SA (K-DUR,KLOR-CON) 20 MEQ tablet TAKE 1 TABLET BY MOUTH ONCE DAILY 90 tablet 3  . predniSONE (DELTASONE) 10 MG tablet TAKE 1 TABLET BY MOUTH ONCE DAILY WITH BREAKFAST 30 tablet 5  . predniSONE (DELTASONE) 5 MG tablet Take 2.5-5 mg by mouth daily as needed.     . pyridostigmine (MESTINON) 60 MG tablet Take 1 tablet (60 mg total) by mouth 3 (three) times daily. 90 tablet 5  . rosuvastatin (CRESTOR) 20 MG tablet TAKE 1 TABLET BY MOUTH AT BEDTIME. 90 tablet 2  . tiZANidine (ZANAFLEX) 2 MG tablet Take 1 tablet (2 mg total) at bedtime as needed by mouth for muscle spasms. 30 tablet 5   No current facility-administered medications for this visit.     Allergies:  Contrast media [iodinated diagnostic agents]; Gadolinium derivatives; and Metrizamide   Social History:  The patient  reports that  has never smoked. he has never used smokeless tobacco. He reports that he does not drink alcohol or use drugs.   Family History:  The patient's family history includes Heart disease in his brother.    ROS:  Please see the history of present illness.  Otherwise, review of systems is positive for generalized fatigue.  All other systems are reviewed and negative.    PHYSICAL EXAM: VS:  BP (!) 90/58   Pulse 80   Ht 6\' 1"  (1.854 m)   Wt 220 lb (99.8 kg)   BMI 29.03 kg/m  , BMI Body mass index is 29.03 kg/m. GEN: Well nourished, well developed, in no acute distress  HEENT: normal  Neck: no JVD, no masses. No carotid bruits Cardiac: RRR with 2/6 systolic ejection murmur at the RUSB< no diastolic murmur               Respiratory:  clear to auscultation bilaterally, normal work of breathing GI: soft, nontender, nondistended, + BS MS: no deformity or atrophy  Ext: no pretibial edema, pedal pulses 2+= bilaterally Skin: warm and dry, no rash Neuro:  Strength and sensation are  intact Psych: euthymic mood, full affect  EKG:  EKG is ordered today. The ekg ordered today shows atrial fibrillation 80 bpm, right bundle branch block, left anterior fascicular block.  Recent Labs: 07/02/2016: TSH 2.090 11/19/2016: ALT 17; BUN 14; Creatinine, Ser 1.00; Hemoglobin 12.1; Platelets 190; Potassium 4.4; Sodium 144   Lipid Panel     Component Value Date/Time   CHOL 162 07/02/2016 1126   TRIG 120 07/02/2016 1126   HDL 71 07/02/2016 1126   CHOLHDL 2.3 07/02/2016 1126   CHOLHDL 3.4 09/10/2014 1530   VLDL 23 09/10/2014 1530   LDLCALC 67 07/02/2016 1126   LDLDIRECT 117.9 11/16/2010 1355      Wt Readings from Last 3 Encounters:  03/04/17 220 lb (99.8 kg)  01/12/17 222 lb (100.7 kg)  12/09/16 233 lb 11 oz (106 kg)     Cardiac Studies Reviewed: Cardiac Cath 07-06-2016: Conclusion   1. Continued patency of the stented segment in the left circumflex 2. Diffuse nonobstructive coronary artery disease as detailed above with no high-grade stenoses identified 3. Low normal LV systolic function with no significant gradient across the patient's bioprosthetic aortic valve 4. Essentially normal right heart hemodynamics 5. Severe systemic hypertension. The patient is given IV metoprolol, IV labetalol during the procedure  Resume eliquis tonight.  Continue same medical therapy. Pt with history of microvascular angina, now with progressive dyspnea and exertional angina. Recommend Phase 2 cardiac rehab as his symptoms continue to limit him despite optimal medical therapy.   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 He recently presented with progressive  exertional angina and shortness of breath. He is referred for right and left heart catheterization.  PROCEDURAL DETAILS: There was an indwelling IV in a right antecubital vein. Using normal sterile technique, the IV was changed out for a 5 Fr brachial sheath over a 0.018 inch wire. The right wrist was then prepped, draped, and anesthetized with 1% lidocaine. Using the modified Seldinger technique a 5/6 [place] Slender  sheath was placed in the right radial artery. Intra-arterial verapamil was administered through the radial artery sheath. IV heparin was administered after a JR4 catheter was advanced into the central aorta. A Swan-Ganz catheter was used for the right heart catheterization. Standard protocol was followed for recording of right heart pressures and sampling of oxygen saturations. Fick cardiac output was calculated. Standard Judkins catheters were used for selective coronary angiography and left ventriculography. A JL 5 diagnostic catheter used for injection of the left coronary artery. There were no immediate procedural complications. The patient was transferred to the post catheterization recovery area for further monitoring.     Estimated blood loss <50 mL.  During this procedure the patient was administered the following to achieve and maintain moderate conscious sedation: Versed 2 mg, Fentanyl 25 mcg, while the patient's heart rate, blood pressure, and oxygen saturation were continuously monitored. The period of conscious sedation was 31 minutes, of which I was present face-to-face 100% of this time.  Coronary Findings   Diagnostic  Dominance: Right  Left Main  Vessel is angiographically normal.  Left Anterior Descending  Prox LAD to Mid LAD lesion 40% stenosed  There is mild diffuse proximal LAD stenosis unchanged from previous study. The LAD wraps around the LV apex  Left Circumflex  Ost Cx to Mid Cx lesion 10% stenosed  The lesion was previously treated. The long stented  segment in the left circumflex is widely patent with very mild in-stent restenosis present.  Right Coronary Artery  There is mild the vessel. Large, dominant vessel. There are diffuse irregularities with mild stenoses in the proximal and mid vessel. The distal vessel is widely patent. The PDA and PLA branches are patent.  Prox RCA lesion 30% stenosed  The lesion is calcified.  Mid RCA lesion 40% stenosed  The lesion is calcified.  Intervention   No interventions have been documented.  Wall Motion              Left Heart   Left Ventricle Somewhat difficult to assess LV function because of frequent ectopic beats, but LVEF is estimated at 55%.  Coronary Diagrams   Diagnostic Diagram       Implants     No implant documentation for this case.  MERGE Images   Show images for Cardiac catheterization   Link to Procedure Log   Procedure Log    Hemo Data    Most Recent Value  Fick Cardiac Output 4.65 L/min  Fick Cardiac Output Index 2.06 (L/min)/BSA  RA A Wave 2 mmHg  RA V Wave 7 mmHg  RA Mean 2 mmHg  RV Systolic Pressure 35 mmHg  RV Diastolic Pressure 0 mmHg  RV EDP 1 mmHg  PA Systolic Pressure 39 mmHg  PA Diastolic Pressure 13 mmHg  PA Mean 24 mmHg  PW A Wave 21 mmHg  PW V Wave 16 mmHg  PW Mean 18 mmHg  AO Systolic Pressure 497 mmHg  AO Diastolic Pressure 88 mmHg  AO Mean 026 mmHg  LV Systolic Pressure 378 mmHg  LV Diastolic Pressure 6 mmHg  LV EDP 9 mmHg  Arterial Occlusion Pressure Extended Systolic Pressure 588 mmHg  Arterial Occlusion Pressure Extended Diastolic Pressure 92 mmHg  Arterial Occlusion Pressure Extended Mean Pressure 122 mmHg  Left Ventricular Apex Extended Systolic Pressure 502 mmHg  Left Ventricular Apex Extended Diastolic Pressure 6 mmHg  Left Ventricular Apex Extended EDP Pressure 13 mmHg  QP/QS 1  TPVR Index 11.68 HRUI  TSVR Index 57.39 HRUI  PVR SVR Ratio 0.05  TPVR/TSVR Ratio 0.2   Cardiac Cath 07-06-2016: Conclusion   1.  Continued patency of the stented segment in the left circumflex 2. Diffuse nonobstructive coronary artery disease as detailed above with no high-grade stenoses identified 3. Low normal LV systolic function with no significant gradient across the patient's bioprosthetic aortic valve 4. Essentially normal right heart hemodynamics 5. Severe systemic hypertension. The patient is given IV metoprolol, IV labetalol during the procedure  Resume eliquis tonight.  Continue same medical therapy. Pt with history of microvascular angina, now with progressive dyspnea and exertional angina. Recommend Phase 2 cardiac rehab as his symptoms continue to limit him despite optimal medical therapy.   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 He recently presented with progressive exertional angina and shortness of breath. He is referred for right and left heart catheterization.  PROCEDURAL DETAILS: There was an indwelling IV in a right antecubital vein. Using normal sterile technique, the IV was changed out for a 5 Fr brachial sheath over a 0.018 inch wire. The right wrist was then prepped, draped, and anesthetized with 1% lidocaine. Using the modified Seldinger technique a 5/6 [place] Slender sheath was placed in the right radial artery. Intra-arterial verapamil was administered through the radial artery sheath. IV heparin was administered after a JR4 catheter was advanced into the central aorta. A Swan-Ganz catheter was used for the right heart catheterization. Standard protocol was followed for recording of right heart pressures and sampling of oxygen saturations. Fick cardiac output was  calculated. Standard Judkins catheters were used for selective coronary angiography and left ventriculography. A JL 5 diagnostic catheter used for injection of the left coronary artery. There were no immediate procedural complications. The patient was transferred to the post catheterization recovery area for further monitoring.     Estimated blood loss <50 mL.  During this procedure the patient was administered the following to achieve and maintain moderate conscious sedation: Versed 2 mg, Fentanyl 25 mcg, while the patient's heart rate, blood pressure, and oxygen saturation were continuously monitored. The period of conscious sedation was 31 minutes, of which I was present face-to-face 100% of this time.  Coronary Findings   Diagnostic  Dominance: Right  Left Main  Vessel is angiographically normal.  Left Anterior Descending  Prox LAD to Mid LAD lesion 40% stenosed  There is mild diffuse proximal LAD stenosis unchanged from previous study. The LAD wraps around the LV apex  Left Circumflex  Ost Cx to Mid Cx lesion 10% stenosed  The lesion was previously treated. The long stented segment in the left circumflex is widely patent with very mild in-stent restenosis present.  Right Coronary Artery  There is mild the vessel. Large, dominant vessel. There are diffuse irregularities with mild stenoses in the proximal and mid vessel. The distal vessel is widely patent. The PDA and PLA branches are patent.  Prox RCA lesion 30% stenosed  The lesion is calcified.  Mid RCA lesion 40% stenosed  The lesion is calcified.  Intervention   No interventions have been documented.  Wall Motion              Left Heart   Left Ventricle Somewhat difficult to assess LV function because of frequent ectopic beats, but  LVEF is estimated at 55%.  Coronary Diagrams   Diagnostic Diagram       Implants     No implant documentation for this case.  MERGE Images   Show images for Cardiac  catheterization   Link to Procedure Log   Procedure Log    Hemo Data    Most Recent Value  Fick Cardiac Output 4.65 L/min  Fick Cardiac Output Index 2.06 (L/min)/BSA  RA A Wave 2 mmHg  RA V Wave 7 mmHg  RA Mean 2 mmHg  RV Systolic Pressure 35 mmHg  RV Diastolic Pressure 0 mmHg  RV EDP 1 mmHg  PA Systolic Pressure 39 mmHg  PA Diastolic Pressure 13 mmHg  PA Mean 24 mmHg  PW A Wave 21 mmHg  PW V Wave 16 mmHg  PW Mean 18 mmHg  AO Systolic Pressure 096 mmHg  AO Diastolic Pressure 88 mmHg  AO Mean 283 mmHg  LV Systolic Pressure 662 mmHg  LV Diastolic Pressure 6 mmHg  LV EDP 9 mmHg  Arterial Occlusion Pressure Extended Systolic Pressure 947 mmHg  Arterial Occlusion Pressure Extended Diastolic Pressure 92 mmHg  Arterial Occlusion Pressure Extended Mean Pressure 122 mmHg  Left Ventricular Apex Extended Systolic Pressure 654 mmHg  Left Ventricular Apex Extended Diastolic Pressure 6 mmHg  Left Ventricular Apex Extended EDP Pressure 13 mmHg  QP/QS 1  TPVR Index 11.68 HRUI  TSVR Index 57.39 HRUI  PVR SVR Ratio 0.05  TPVR/TSVR Ratio 0.2     ASSESSMENT AND PLAN: 1.  Coronary artery disease, native vessel, without angina: The patient is stable with respect to his CAD.  He has no anginal symptoms.  His heart catheterization performed within the last 12 months showed stable coronary anatomy.  Medications are reviewed today and will be continued without change.  The patient is not on antiplatelet therapy because of chronic oral anticoagulation.  2.  Atrial fibrillation, persistent: The patient is considering options of rhythm control.  Somewhat difficult to know how much his symptoms are related to atrial fibrillation, but likely would feel better in sinus rhythm.  I encouraged him to move forward with hospital admission for Tikosyn when he returns from his trip.  If he feels significantly better with restoration of sinus rhythm he may consider radiofrequency ablation.  3.  Autonomic  dysfunction: Has had to reduce antihypertensive medications because of low blood pressure and orthostasis.  4.  Hyperlipidemia: Treated with Crestor.  Most recent lipids reviewed.  5.  Chronic diastolic heart failure: Was treated with a short course of furosemide but did not have any symptomatic improvement.  He has discontinued this.  Current medicines are reviewed with the patient today.  The patient does not have concerns regarding medicines.  Labs/ tests ordered today include:   Orders Placed This Encounter  Procedures  . EKG 12-Lead    Disposition:   FU 6 months  Signed, Sherren Mocha, MD  03/05/2017 3:03 PM    [facility] [address], [place], [place]  [address] Phone: [phone]; Fax: [phone]

## 2017-03-04 NOTE — Patient Instructions (Signed)

## 2017-03-21 ENCOUNTER — Encounter: Payer: Self-pay | Admitting: *Deleted

## 2017-03-25 ENCOUNTER — Telehealth: Payer: Self-pay | Admitting: Rheumatology

## 2017-03-25 NOTE — Telephone Encounter (Signed)
LMOM to schedule follow-up visit

## 2017-03-25 NOTE — Telephone Encounter (Signed)
-----   Message from Carole Binning, LPN sent at 29/79/8921 11:29 AM EST ----- Regarding: Please schedule patient for a follow up visit.  Please schedule patient for a follow up visit. Patient due January 2019. Thanks!

## 2017-03-29 ENCOUNTER — Telehealth: Payer: Self-pay

## 2017-03-29 NOTE — Telephone Encounter (Signed)
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  He has OV with Dr. Lovena Le 2/5 at 1400. She was grateful for assistance.

## 2017-03-30 NOTE — Progress Notes (Signed)
Office Visit Note  Patient: Kevin Buff, MD             Date of Birth: 1934-08-28           MRN: [id number]             PCP: [name], MD Referring: Altheimer, Legrand Como, MD Visit Date: 03/31/2017 Occupation: @GUAROCC @    Subjective:  Neck and shoulder pain.   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  He states he continues to have a lot of pain and discomfort in his C-spine.  He has been also having discomfort in his bilateral shoulders.  He describes discomfort in bilateral arms.  His hands continue to work.  He has been having discomfort in his right trochanteric bursa.  He is unable to function without hydrocodone.  Due to multiple medical problems he is not able to take any other medications.  He describes the pain level on the scale of 0-10 without medication about 8 and 9 and with medication about 5.  Activities of Daily Living:  Patient reports morning stiffness for 10 minutes.   Patient Reports nocturnal pain.  Difficulty dressing/grooming: Denies Difficulty climbing stairs: Reports Difficulty getting out of chair: Reports Difficulty using hands for taps, buttons, cutlery, and/or writing: Reports   Review of Systems  Constitutional: Positive for fatigue. Negative for night sweats and weakness ( ).  HENT: Negative for mouth sores, mouth dryness and nose dryness.   Eyes: Negative for redness and dryness.  Respiratory: Positive for shortness of breath. Negative for difficulty breathing.   Cardiovascular: Positive for palpitations. Negative for chest pain, hypertension, irregular heartbeat and swelling in legs/feet.  Gastrointestinal: Positive for constipation. Negative for diarrhea.  Endocrine: Negative for increased urination.  Musculoskeletal: Positive for arthralgias, joint pain, myalgias, morning stiffness and myalgias. Negative for joint swelling, muscle weakness and muscle tenderness.  Skin:  Negative for color change, rash, hair loss, nodules/bumps, skin tightness, ulcers and sensitivity to sunlight.  Allergic/Immunologic: Negative for susceptible to infections.  Neurological: Negative for dizziness, fainting, memory loss and night sweats.  Hematological: Negative for swollen glands.  Psychiatric/Behavioral: Positive for sleep disturbance. Negative for depressed mood. The patient is not nervous/anxious.     PMFS History:  Patient Active Problem List   Diagnosis Date Noted  . Vitamin D deficiency 05/20/2016  . History of pacemaker 05/20/2016  . History of stroke/[place]  05/20/2016  . Primary osteoarthritis of both hands 03/10/2016  . DDD cervical spine 03/10/2016  . 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 Chronic left shoulder pain 03/10/2016  . Trochanteric bursitis of right hip 03/10/2016  . Other fatigue 03/10/2016  . Myalgia 03/10/2016  . Other chronic pain 03/10/2016  . 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 Typical atrial flutter ([place])   . PVC's (premature ventricular contractions) 01/30/2015  . Pacemaker 04/30/2013  . MGUS (monoclonal gammopathy of unknown significance) 02/17/2013  . Orthostatic hypotension 04/11/2011  . Long term current use of anticoagulant therapy 03/24/2011  . S/P AVR 03/24/2011  . Pleural effusion 03/24/2011  . Hypothyroidism 11/22/2010  . Atrial flutter ([place]) 08/19/2010  . CHEST PAIN 04/06/2010  . HYPERLIPIDEMIA-MIXED 12/08/2007  . PRIMARY HYPERCOAGULABLE STATE 12/08/2007  . Coronary artery disease with exertional angina ([place]) 12/08/2007  . CAD, NATIVE VESSEL 12/08/2007  . AORTIC STENOSIS/ INSUFFICIENCY, NON-RHEUMATIC 12/08/2007  . Atrial fibrillation ([place]) 12/08/2007  . 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 Aortic stenosis    moderate aortic stenosis  . Arthritis   . Benign prostatic hypertrophy   . Chronotropic incompetence with sinus node  dysfunction (HCC)    Status post Guidant dual-mode, dual-pacing, dual-sensing  pacemaker   implantation now programmed to AAI with recent generator change.  . Coronary artery disease    status post multiple prior percutaneous coronary interventions, microvascular angina per Dr Olevia Perches  . Heart murmur   . Hypercoagulable state ([place])    chronically anticoagulated with coumadin  . Hyperlipidemia   . Hyperthyroidism   . Hypothyroidism    Dr. Elyse Hsu  . MGUS (monoclonal gammopathy of unknown significance) 02/17/2013  . Paroxysmal atrial fibrillation ([place])    DR. Lia Foyer,   . Stroke [facility])    1990    Family History  Problem Relation Age of Onset  . Heart disease Brother        Twin brother has coronary disease and recent AVR for AS  . Anesthesia problems Neg Hx   . Hypotension Neg Hx   . Malignant hyperthermia Neg Hx   . Pseudochol deficiency Neg Hx    Past Surgical History:  Procedure Laterality Date  . AORTIC VALVE REPLACEMENT  03/15/2011   Procedure: AORTIC VALVE REPLACEMENT (AVR);  Surgeon: Gaye Pollack, MD;  Location: [place];  Service: Open Heart Surgery;  Laterality: N/A;  . APPENDECTOMY    . CARDIAC CATHETERIZATION     13  . CARDIOVERSION    . CARDIOVERSION  04/15/2011   Procedure: CARDIOVERSION;  Surgeon: Loralie Champagne, MD;  Location: [place];  Service: Cardiovascular;  Laterality: N/A;  . CARDIOVERSION N/A 09/11/2014   Procedure: CARDIOVERSION;  Surgeon: Sueanne Margarita, MD;  Location: [facility] ENDOSCOPY;  Service: Cardiovascular;  Laterality: N/A;  . CARDIOVERSION N/A 06/27/2015   Procedure: CARDIOVERSION;  Surgeon: Thayer Headings, MD;  Location: [facility] ENDOSCOPY;  Service: Cardiovascular;  Laterality: N/A;  . CARDIOVERSION N/A 07/04/2015   Procedure: CARDIOVERSION;  Surgeon: Evans Lance, MD;  Location: [place];  Service: Cardiovascular;  Laterality: N/A;  . EP IMPLANTABLE DEVICE N/A 06/16/2015   Procedure: Pacemaker Implant;  Surgeon: Evans Lance, MD;  Location: [place] CV LAB;  Service: Cardiovascular;  Laterality: N/A;  . hemrrhoidectomy    . LEFT AND RIGHT HEART CATHETERIZATION WITH CORONARY ANGIOGRAM Bilateral 02/01/2011   Procedure: LEFT AND RIGHT HEART CATHETERIZATION WITH CORONARY ANGIOGRAM;  Surgeon: Hillary Bow, MD;  Location: [facility] CATH LAB;  Service: Cardiovascular;  Laterality: Bilateral;  . MAZE  03/15/2011   Procedure: MAZE;  Surgeon: Gaye Pollack, MD;  Location: [place];  Service: Open Heart Surgery;  Laterality: N/A;  . PACEMAKER INSERTION  1991   Guidant PPM, most recent Generator Change by Dr Olevia Perches was 08/22/06  . RIGHT/LEFT HEART CATH AND CORONARY ANGIOGRAPHY N/A 07/06/2016   Procedure: Right/Left Heart Cath and Coronary Angiography;  Surgeon: Sherren Mocha, MD;  Location: [place] CV LAB;  Service: Cardiovascular;  Laterality: N/A;  . TEE WITHOUT CARDIOVERSION  04/15/2011   Procedure: TRANSESOPHAGEAL ECHOCARDIOGRAM (TEE);  Surgeon: Loralie Champagne, MD;  Location: [place];  Service: Cardiovascular;  Laterality: N/A;  . TEE WITHOUT CARDIOVERSION N/A 09/11/2014   Procedure: TRANSESOPHAGEAL ECHOCARDIOGRAM (TEE);  Surgeon: Sueanne Margarita, MD;  Location: [facility] ENDOSCOPY;  Service: Cardiovascular;  Laterality: N/A;   Social History   Social History Narrative   Married. Has grown children   Retired Horticulturist, commercial MD  Objective: Vital Signs: BP (!) 141/83 (BP Location: Left Arm, Patient Position: Sitting, Cuff Size: Large)   Pulse 78   Resp 12   Ht 6\' 2"  (1.88 m)   Wt 228 lb (103.4 kg)   BMI 29.27 kg/m    Physical Exam  Constitutional: He is oriented to person, place, and time. He appears well-developed and well-nourished.  HENT:  Head: Normocephalic and atraumatic.  Eyes: Conjunctivae and EOM are normal. Pupils are equal, round, and reactive to light.  Neck: Normal range of motion. Neck supple.  Cardiovascular: Normal rate, regular rhythm and normal heart sounds.  Pulmonary/Chest: Effort normal and breath sounds normal.   Abdominal: Soft. Bowel sounds are normal.  Neurological: He is alert and oriented to person, place, and time.  Skin: Skin is warm and dry. Capillary refill takes less than 2 seconds.  Psychiatric: He has a normal mood and affect. His behavior is normal.  Nursing note and vitals reviewed.    Musculoskeletal Exam: C-spine limited range of motion.  He has painful range of motion of bilateral shoulders.  He has severe PIP/DIP thickening consistent with osteoarthritis.  Joints knee joints with good range of motion.  He has pedal edema over his right lower extremity.  CDAI Exam: No CDAI exam completed.    Investigation: No additional findings.   Imaging: No results found.  Speciality Comments: No specialty comments available.    Procedures:  No procedures performed Allergies: Contrast media [iodinated diagnostic agents]; Gadolinium derivatives; and Metrizamide   Assessment / Plan:     Visit Diagnoses: Primary osteoarthritis of both hands he has severe osteoarthritis in his hands with a lot of discomfort.  Joint protection muscle strengthening was discussed.  DDD (degenerative disc disease), cervical: He continues to have a lot of neck discomfort.  Spondylosis of lumbar region without myelopathy or radiculopathy: Chronic pain  Trochanteric bursitis of right hip: Has been having increased pain after the recent travel.  Other fatigue - Related to his beta blocker use.  Other chronic pain - hydrocodone UDS: Today Narc agreement: Today he has not increased his dose of hydrocodone in multiple years.  He is unable to tolerate any other medications due to multiple medical problems.  Other medical problems are listed as follows:  Paroxysmal atrial fibrillation ([place]) - He has a pacemaker  Coronary artery disease with exertional angina (HCC)  History of vitamin D deficiency  History of pacemaker  S/P AVR  History of stroke - /[place]   Primary hypercoagulable state  ([place])  MGUS (monoclonal gammopathy of unknown significance)  Long term current use of anticoagulant therapy  Medication monitoring encounter - Plan: Pain Mgmt, Profile 5 w/Conf, U    Orders: Orders Placed This Encounter  Procedures  . Pain Mgmt, Profile 5 w/Conf, U   No orders of the defined types were placed in this encounter.   Face-to-face time spent with patient was 30 minutes.  Greater than 50% of time was spent in counseling and coordination of care.  Follow-Up Instructions: Return in about 6 months (around 09/28/2017) for Osteoarthritis DDD.   Bo Merino, MD  Note - This record has been created using Editor, commissioning.  Chart creation errors have been sought, but may not always  have been located. Such creation errors do not reflect on  the standard of medical care.

## 2017-03-31 ENCOUNTER — Ambulatory Visit (INDEPENDENT_AMBULATORY_CARE_PROVIDER_SITE_OTHER): Payer: Medicare Other | Admitting: Rheumatology

## 2017-03-31 ENCOUNTER — Encounter: Payer: Self-pay | Admitting: Rheumatology

## 2017-03-31 VITALS — BP 141/83 | HR 78 | Resp 12 | Ht 74.0 in | Wt 228.0 lb

## 2017-03-31 DIAGNOSIS — M5030 Other cervical disc degeneration, unspecified cervical region: Secondary | ICD-10-CM | POA: Diagnosis not present

## 2017-03-31 DIAGNOSIS — Z950 Presence of cardiac pacemaker: Secondary | ICD-10-CM

## 2017-03-31 DIAGNOSIS — Z8639 Personal history of other endocrine, nutritional and metabolic disease: Secondary | ICD-10-CM

## 2017-03-31 DIAGNOSIS — I480 Paroxysmal atrial fibrillation: Secondary | ICD-10-CM | POA: Diagnosis not present

## 2017-03-31 DIAGNOSIS — M19042 Primary osteoarthritis, left hand: Secondary | ICD-10-CM

## 2017-03-31 DIAGNOSIS — M47816 Spondylosis without myelopathy or radiculopathy, lumbar region: Secondary | ICD-10-CM | POA: Diagnosis not present

## 2017-03-31 DIAGNOSIS — I2510 Atherosclerotic heart disease of native coronary artery without angina pectoris: Secondary | ICD-10-CM

## 2017-03-31 DIAGNOSIS — M7061 Trochanteric bursitis, right hip: Secondary | ICD-10-CM

## 2017-03-31 DIAGNOSIS — I25118 Atherosclerotic heart disease of native coronary artery with other forms of angina pectoris: Secondary | ICD-10-CM | POA: Diagnosis not present

## 2017-03-31 DIAGNOSIS — Z5181 Encounter for therapeutic drug level monitoring: Secondary | ICD-10-CM | POA: Diagnosis not present

## 2017-03-31 DIAGNOSIS — M19041 Primary osteoarthritis, right hand: Secondary | ICD-10-CM | POA: Diagnosis not present

## 2017-03-31 DIAGNOSIS — Z952 Presence of prosthetic heart valve: Secondary | ICD-10-CM | POA: Diagnosis not present

## 2017-03-31 DIAGNOSIS — Z7901 Long term (current) use of anticoagulants: Secondary | ICD-10-CM

## 2017-03-31 DIAGNOSIS — G8929 Other chronic pain: Secondary | ICD-10-CM | POA: Diagnosis not present

## 2017-03-31 DIAGNOSIS — Z8673 Personal history of transient ischemic attack (TIA), and cerebral infarction without residual deficits: Secondary | ICD-10-CM

## 2017-03-31 DIAGNOSIS — R5383 Other fatigue: Secondary | ICD-10-CM

## 2017-03-31 DIAGNOSIS — D472 Monoclonal gammopathy: Secondary | ICD-10-CM

## 2017-03-31 DIAGNOSIS — I209 Angina pectoris, unspecified: Secondary | ICD-10-CM

## 2017-03-31 DIAGNOSIS — D6859 Other primary thrombophilia: Secondary | ICD-10-CM

## 2017-04-04 LAB — PAIN MGMT, PROFILE 5 W/CONF, U
ALPHAHYDROXYALPRAZOLAM: NEGATIVE ng/mL (ref <25)
ALPHAHYDROXYTRIAZOLAM: NEGATIVE ng/mL (ref <50)
AMINOCLONAZEPAM: NEGATIVE ng/mL (ref <25)
Alphahydroxymidazolam: NEGATIVE ng/mL (ref <50)
Amobarbital: NEGATIVE ng/mL (ref <100)
Amphetamines: NEGATIVE ng/mL (ref <500)
BARBITURATES: POSITIVE ng/mL — AB (ref <300)
BUTALBITAL: 2219 ng/mL — AB (ref <100)
Benzodiazepines: POSITIVE ng/mL — AB (ref <100)
Cocaine Metabolite: NEGATIVE ng/mL (ref <150)
Codeine: NEGATIVE ng/mL (ref <50)
Creatinine: 61.1 mg/dL
HYDROCODONE: 254 ng/mL — AB (ref <50)
Hydromorphone: 257 ng/mL — ABNORMAL HIGH (ref <50)
Hydroxyethylflurazepam: NEGATIVE ng/mL (ref <50)
Lorazepam: 378 ng/mL — ABNORMAL HIGH (ref <50)
METHADONE METABOLITE: NEGATIVE ng/mL (ref <100)
Marijuana Metabolite: NEGATIVE ng/mL (ref <20)
Morphine: NEGATIVE ng/mL (ref <50)
NORDIAZEPAM: NEGATIVE ng/mL (ref <50)
NORHYDROCODONE: 662 ng/mL — AB (ref <50)
OPIATES: POSITIVE ng/mL — AB (ref <100)
OXAZEPAM: NEGATIVE ng/mL (ref <50)
Oxidant: NEGATIVE ug/mL (ref <200)
Oxycodone: NEGATIVE ng/mL (ref <100)
PHENOBARBITAL: NEGATIVE ng/mL (ref <100)
Pentobarbital: NEGATIVE ng/mL (ref <100)
SECOBARBITAL: NEGATIVE ng/mL (ref <100)
TEMAZEPAM: NEGATIVE ng/mL (ref <50)
pH: 6.88 (ref 4.5–9.0)

## 2017-04-05 ENCOUNTER — Encounter: Payer: Self-pay | Admitting: Internal Medicine

## 2017-04-05 ENCOUNTER — Telehealth: Payer: Self-pay | Admitting: Pharmacist

## 2017-04-05 ENCOUNTER — Ambulatory Visit (INDEPENDENT_AMBULATORY_CARE_PROVIDER_SITE_OTHER): Payer: Medicare Other | Admitting: Internal Medicine

## 2017-04-05 VITALS — BP 142/88 | HR 93 | Ht 74.0 in | Wt 222.0 lb

## 2017-04-05 DIAGNOSIS — I480 Paroxysmal atrial fibrillation: Secondary | ICD-10-CM

## 2017-04-05 DIAGNOSIS — I484 Atypical atrial flutter: Secondary | ICD-10-CM | POA: Diagnosis not present

## 2017-04-05 DIAGNOSIS — Z950 Presence of cardiac pacemaker: Secondary | ICD-10-CM

## 2017-04-05 DIAGNOSIS — I495 Sick sinus syndrome: Secondary | ICD-10-CM

## 2017-04-05 DIAGNOSIS — I2510 Atherosclerotic heart disease of native coronary artery without angina pectoris: Secondary | ICD-10-CM

## 2017-04-05 NOTE — Patient Instructions (Addendum)
Medication Instructions:  Your physician recommends that you continue on your current medications as directed. Please refer to the Current Medication list given to you today.  Labwork: None ordered.  Testing/Procedures: None ordered.  Follow-Up: Your physician wants you to follow-up in: one year with Dr. Lovena Le.   You will receive a reminder letter in the mail two months in advance. If you don't receive a letter, please call our office to schedule the follow-up appointment.  Remote monitoring is used to monitor your Pacemaker from home. This monitoring reduces the number of office visits required to check your device to one time per year. It allows [place] to keep an eye on the functioning of your device to ensure it is working properly. You are scheduled for a device check from home on 05/26/2017. You may send your transmission at any time that day. If you have a wireless device, the transmission will be sent automatically. After your physician reviews your transmission, you will receive a postcard with your next transmission date.  Any Other Special Instructions Will Be Listed Below (If Applicable).    If you need a refill on your cardiac medications before your next appointment, please call your pharmacy.  Dofetilide capsules What is this medicine? DOFETILIDE (doe FET il ide) is an antiarrhythmic drug. It helps make your heart beat regularly. This medicine also helps to slow rapid heartbeats. This medicine may be used for other purposes; ask your health care provider or pharmacist if you have questions. COMMON BRAND NAME(S): Tikosyn What should I tell my health care provider before I take this medicine? They need to know if you have any of these conditions: -heart disease -history of low levels of potassium or magnesium -kidney disease -liver disease -an unusual or allergic reaction to dofetilide, other medicines, foods, dyes, or preservatives -pregnant or trying to get  pregnant -breast-feeding How should I use this medicine? Take this medicine by mouth with a glass of water. Follow the directions on the prescription label. You can take this medicine with or without food. Do not drink grapefruit juice with this medicine. Take your doses at regular intervals. Do not take your medicine more often than directed. Do not stop taking this medicine suddenly. This may cause serious, heart-related side effects. Your doctor will tell you how much medicine to take. If your doctor wants you to stop the medicine, the dose will be slowly lowered over time to avoid any side effects. Talk to your pediatrician regarding the use of this medicine in children. Special care may be needed. Overdosage: If you think you have taken too much of this medicine contact a poison control center or emergency room at once. NOTE: This medicine is only for you. Do not share this medicine with others. What if I miss a dose? If you miss a dose, take it as soon as you can. If it is almost time for your next dose, take only that dose. Do not take double or extra doses. What may interact with this medicine? Do not take this medicine with any of the following medications: -cimetidine -dolutegravir -hydrochlorothiazide alone or in combination with other medicines -isavuconazonium -ketoconazole -megestrol -other medicines that prolong the QT interval (cause an abnormal heart rhythm) -prochlorperazine -trimethoprim alone or in combination with sulfamethoxazole -verapamil This medicine may also interact with the following medications: -amiloride -certain antidepressants like fluvoxamine or paroxetine -certain antiviral medicines for HIV or AIDS like atazanavir or darunavir -certain medicines for fungal infections like clotrimazole or miconazole -digoxin -diltiazem -  dronabinol, THC -grapefruit juice -metformin -nefazodone -triamterene -zafirlukast This list may not describe all possible  interactions. Give your health care provider a list of all the medicines, herbs, non-prescription drugs, or dietary supplements you use. Also tell them if you smoke, drink alcohol, or use illegal drugs. Some items may interact with your medicine. What should I watch for while using this medicine? Visit your doctor or health care professional for regular checks on your progress. Wear a medical ID bracelet or chain, and carry a card that describes your disease and details of your medicine and dosage times. Check your heart rate and blood pressure regularly while you are taking this medicine. Ask your doctor or health care professional what your heart rate and blood pressure should be, and when you should contact him or her. Your doctor or health care professional also may schedule regular tests to check your progress. You will be started on this medicine in a specialized facility for at least three days. You will be monitored to find the right dose of medicine for you. It is very important that you take your medicine exactly as prescribed when you leave the hospital. The correct dosing of this medicine is very important to treat your condition and prevent possible serious side effects. What side effects may I notice from receiving this medicine? Side effects that you should report to your doctor or health care professional as soon as possible: -allergic reactions like skin rash, itching or hives, swelling of the face, lips, or tongue -breathing problems -dizziness -fast or rapid beating of the heart -feeling faint or lightheaded -swelling of the ankles -unusually weak or tired -vomiting Side effects that usually do not require medical attention (report to your doctor or health care professional if they continue or are bothersome): -cough -diarrhea -difficulty sleeping -headache -nausea -stomach pain This list may not describe all possible side effects. Call your doctor for medical advice about  side effects. You may report side effects to FDA at 1-800-FDA-1088. Where should I keep my medicine? Keep out of the reach of children. Store at room temperature between 15 and 30 degrees C (59 and 86 degrees F). Protect the medicine from moisture or humidity. Keep container tightly closed. Throw away any unused medicine after the expiration date. NOTE: This sheet is a summary. It may not cover all possible information. If you have questions about this medicine, talk to your doctor, pharmacist, or health care provider.  2018 Elsevier/Gold Standard (2015-12-29 62:56:38)

## 2017-04-05 NOTE — Telephone Encounter (Signed)
Medication list reviewed in anticipation of upcoming Tikosyn initiation - referral by Dr Lovena Le. Patient is not taking any contraindicated or QTc prolonging medications.   Patient is anticoagulated on Eliquis 5mg  BID on the appropriate dose. Please ensure that patient has not missed any anticoagulation doses in the 3 weeks prior to Tikosyn initiation.   Patient will need to be counseled to avoid use of Benadryl while on Tikosyn and in the 2-3 days prior to Tikosyn initiation.

## 2017-04-05 NOTE — Progress Notes (Signed)
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 He is a pleasant 83 yo man with persistent atrial fib, s/p prior MAZE, who developed atrial flutter with a RVR. We reduced his dose of toprol when I saw him last. He feels fatigued and has had a reduction in his energy level. He has not had syncope. He does not have palpitations. Minimal peripheral edema. Allergies  Allergen Reactions  . Contrast Media [Iodinated Diagnostic Agents] Hives  . Gadolinium Derivatives Hives  . Metrizamide Hives     Current Outpatient Medications  Medication Sig Dispense Refill  . apixaban (ELIQUIS) 5 MG TABS tablet Take 1 tablet (5 mg total) by mouth 2 (two) times daily. 180 tablet 3  . bisacodyl (DULCOLAX) 5 MG EC tablet Take 5 mg by mouth daily as needed (constipation).     . butalbital-acetaminophen-caffeine (FIORICET, ESGIC) 50-325-40 MG per tablet Take 1 tablet by mouth 2 (two) times daily as needed (osteo-arthritis pain).     . Cholecalciferol (VITAMIN D3) 2000 units capsule Take 1 capsule by mouth daily.    . Coenzyme Q10 (COQ10) 100 MG CAPS Take 100 mg by mouth daily.    . Diclofenac Sodium 1.5 % SOLN Take 1 application by mouth 2 (two) times daily as needed (Knee pain).     Marland Kitchen doxazosin (CARDURA) 4 MG tablet Take 4 mg by mouth at bedtime.     Marland Kitchen HYDROcodone-acetaminophen (NORCO) 10-325 MG tablet Take 1 tablet by mouth 2 (two) times daily as needed. 100 tablet 0  . HYDROcodone-acetaminophen (NORCO) 10-325 MG tablet TAKE 1 TABLET BY MOUTH TWICE A DAY AS NEEDED 100 tablet 0  . HYDROcodone-acetaminophen (NORCO) 10-325 MG tablet TAKE 1 TABLET BY MOUTH TWICE DAILY AS NEEDED 100 tablet 0  . LORazepam (ATIVAN) 1 MG tablet Take 1 mg by mouth at bedtime.     . magnesium oxide (MAG-OX) 400 MG tablet Take 400 mg by mouth daily.    . metoprolol succinate (TOPROL-XL) 100 MG 24 hr tablet Take 100 mg by mouth daily. Take 50 mg by mouth at night. Take with or immediately following a meal.    . nitroGLYCERIN (NITROSTAT)  0.4 MG SL tablet Place 1 tablet (0.4 mg total) under the tongue every 5 (five) minutes as needed for chest pain. 25 tablet 3  . OVER THE COUNTER MEDICATION Apply 1 patch topically daily as needed (neck and shoulder pain). Chinese pain patch    . potassium chloride SA (K-DUR,KLOR-CON) 20 MEQ tablet TAKE 1 TABLET BY MOUTH ONCE DAILY 90 tablet 3  . predniSONE (DELTASONE) 10 MG tablet TAKE 1 TABLET BY MOUTH ONCE DAILY WITH BREAKFAST 30 tablet 5  . predniSONE (DELTASONE) 5 MG tablet Take 2.5-5 mg by mouth daily as needed.     . pyridostigmine (MESTINON) 60 MG tablet Take 1 tablet (60 mg total) by mouth 3 (three) times daily. 90 tablet 5  . rosuvastatin (CRESTOR) 20 MG tablet TAKE 1 TABLET BY MOUTH AT BEDTIME. 90 tablet 2  . tiZANidine (ZANAFLEX) 2 MG tablet Take 1 tablet (2 mg total) at bedtime as needed by mouth for muscle spasms. 30 tablet 5   No current facility-administered medications for this visit.      Past Medical History:  Diagnosis Date  . Aortic stenosis    moderate aortic stenosis  . Arthritis   . Benign prostatic hypertrophy   . Chronotropic incompetence with sinus node dysfunction (HCC)    Status post Guidant dual-mode, dual-pacing, dual-sensing  pacemaker  implantation now programmed to AAI with recent generator change.  . Coronary artery disease    status post multiple prior percutaneous coronary interventions, microvascular angina per Dr Olevia Perches  . Heart murmur   . Hypercoagulable state ([place])    chronically anticoagulated with coumadin  . Hyperlipidemia   . Hyperthyroidism   . Hypothyroidism    Dr. Elyse Hsu  . MGUS (monoclonal gammopathy of unknown significance) 02/17/2013  . Paroxysmal atrial fibrillation ([place])    DR. Lia Foyer,   . Stroke ([place])    1990    ROS:   All systems reviewed and negative except as noted in the HPI.   Past Surgical History:  Procedure Laterality Date  . AORTIC VALVE REPLACEMENT  03/15/2011   Procedure: AORTIC VALVE REPLACEMENT (AVR);   Surgeon: Gaye Pollack, MD;  Location: [place];  Service: Open Heart Surgery;  Laterality: N/A;  . APPENDECTOMY    . CARDIAC CATHETERIZATION     11  . CARDIOVERSION    . CARDIOVERSION  04/15/2011   Procedure: CARDIOVERSION;  Surgeon: Loralie Champagne, MD;  Location: [place];  Service: Cardiovascular;  Laterality: N/A;  . CARDIOVERSION N/A 09/11/2014   Procedure: CARDIOVERSION;  Surgeon: Sueanne Margarita, MD;  Location: [facility] ENDOSCOPY;  Service: Cardiovascular;  Laterality: N/A;  . CARDIOVERSION N/A 06/27/2015   Procedure: CARDIOVERSION;  Surgeon: Thayer Headings, MD;  Location: [facility] ENDOSCOPY;  Service: Cardiovascular;  Laterality: N/A;  . CARDIOVERSION N/A 07/04/2015   Procedure: CARDIOVERSION;  Surgeon: Evans Lance, MD;  Location: [place];  Service: Cardiovascular;  Laterality: N/A;  . EP IMPLANTABLE DEVICE N/A 06/16/2015   Procedure: Pacemaker Implant;  Surgeon: Evans Lance, MD;  Location: [place] CV LAB;  Service: Cardiovascular;  Laterality: N/A;  . hemrrhoidectomy    . LEFT AND RIGHT HEART CATHETERIZATION WITH CORONARY ANGIOGRAM Bilateral 02/01/2011   Procedure: LEFT AND RIGHT HEART CATHETERIZATION WITH CORONARY ANGIOGRAM;  Surgeon: Hillary Bow, MD;  Location[facility] CATH LAB;  Service: Cardiovascular;  Laterality: Bilateral;  . MAZE  03/15/2011   Procedure: MAZE;  Surgeon: Gaye Pollack, MD;  Location: [place];  Service: Open Heart Surgery;  Laterality: N/A;  . PACEMAKER INSERTION  1991   Guidant PPM, most recent Generator Change by Dr Olevia Perches was 08/22/06  . RIGHT/LEFT HEART CATH AND CORONARY ANGIOGRAPHY N/A 07/06/2016   Procedure: Right/Left Heart Cath and Coronary Angiography;  Surgeon: Sherren Mocha, MD;  Location: [place] CV LAB;  Service: Cardiovascular;  Laterality: N/A;  . TEE WITHOUT CARDIOVERSION  04/15/2011   Procedure: TRANSESOPHAGEAL ECHOCARDIOGRAM (TEE);  Surgeon: Loralie Champagne, MD;  Location: [place];  Service: Cardiovascular;  Laterality: N/A;  . TEE WITHOUT  CARDIOVERSION N/A 09/11/2014   Procedure: TRANSESOPHAGEAL ECHOCARDIOGRAM (TEE);  Surgeon: Sueanne Margarita, MD;  Location: [facility] ENDOSCOPY;  Service: Cardiovascular;  Laterality: N/A;     Family History  Problem Relation Age of Onset  . Heart disease Brother        Twin brother has coronary disease and recent AVR for AS  . Anesthesia problems Neg Hx   . Hypotension Neg Hx   . Malignant hyperthermia Neg Hx   . Pseudochol deficiency Neg Hx      Social History   Socioeconomic History  . Marital status: Married    Spouse name: Not on file  . Number of children: Not on file  . Years of education: Not on file  . Highest education level: Not on file  Social Needs  . Financial resource strain: Not  on file  . Food insecurity - worry: Not on file  . Food insecurity - inability: Not on file  . Transportation needs - medical: Not on file  . Transportation needs - non-medical: Not on file  Occupational History  . Not on file  Tobacco Use  . Smoking status: Never Smoker  . Smokeless tobacco: Never Used  Substance and Sexual Activity  . Alcohol use: No    Alcohol/week: 0.0 oz  . Drug use: No  . Sexual activity: Not on file  Other Topics Concern  . Not on file  Social History Narrative   Married. Has grown children   Retired Horticulturist, commercial MD        BP (!) 142/88   Pulse 93   Ht 6\' 2"  (1.88 m)   Wt 222 lb (100.7 kg)   BMI 28.50 kg/m   Physical Exam:  Well appearing NAD HEENT: Unremarkable Neck:  No JVD, no thyromegally Lymphatics:  No adenopathy Back:  No CVA tenderness Lungs:  Clear HEART:  Regular rate rhythm, no murmurs, no rubs, no clicks Abd:  soft, positive bowel sounds, no organomegally, no rebound, no guarding Ext:  2 plus pulses, no edema, no cyanosis, no clubbing Skin:  No rashes no nodules Neuro:  CN II through XII intact, motor grossly intact  EKG - none  DEVICE  Normal device function.  See PaceArt for details. Underlying rhythm is atrial  fib  Assess/Plan: 1. Atrial fib/flutter - his ventricular rates are controlled for the most part but he remains symptomatic. We discussed treatment with dofetilide. He is a marginal candidate based on his age, time out of rhythm and QTC. I have offered him inpatient initiation but warned him that he may not be able to take dofetilide. Also it might not be effective. However, I think he will feel better if he can maintain NSR and if he did, then consideration for PVI would be made.  2. Sinus node dysfunction - he is s/p PPM insertion and his device is working normally. 3. CAD - he denies angina at rest although he does have some with exertion.  4. Chronic diastolic heart failure - his symptoms are class 2b. Hopefully he will improve if he can be maintained in NSR.  [name].D.

## 2017-04-06 DIAGNOSIS — R350 Frequency of micturition: Secondary | ICD-10-CM | POA: Diagnosis not present

## 2017-04-06 DIAGNOSIS — N401 Enlarged prostate with lower urinary tract symptoms: Secondary | ICD-10-CM | POA: Diagnosis not present

## 2017-04-06 MED FILL — LORazepam 1 MG TABS: 1 | 60 days supply | Qty: 60 | Fill #0

## 2017-04-06 MED FILL — predniSONE 10 MG TABS: 10 | 30 days supply | Qty: 30 | Fill #2

## 2017-04-06 MED FILL — METOPROLOL SUCC ER 100 MG T: 100 | 90 days supply | Qty: 180 | Fill #1

## 2017-04-11 ENCOUNTER — Encounter (HOSPITAL_COMMUNITY): Payer: Self-pay | Admitting: Nurse Practitioner

## 2017-04-11 ENCOUNTER — Ambulatory Visit (HOSPITAL_COMMUNITY)
Admission: RE | Admit: 2017-04-11 | Discharge: 2017-04-11 | Disposition: A | Discharge status: Home / Self Care | Payer: Medicare Other | Source: Ambulatory Visit | Attending: Nurse Practitioner | Admitting: Nurse Practitioner

## 2017-04-11 ENCOUNTER — Other Ambulatory Visit: Payer: Self-pay

## 2017-04-11 ENCOUNTER — Inpatient Hospital Stay (HOSPITAL_COMMUNITY)
Admission: RE | Admit: 2017-04-11 | Discharge: 2017-04-14 | DRG: 310 | Disposition: A | Discharge status: Home / Self Care | Payer: Medicare Other | Source: Ambulatory Visit | Attending: Internal Medicine | Admitting: Internal Medicine

## 2017-04-11 VITALS — BP 126/84 | HR 79 | Ht 74.0 in | Wt 230.0 lb

## 2017-04-11 DIAGNOSIS — I481 Persistent atrial fibrillation: Principal | ICD-10-CM | POA: Diagnosis present

## 2017-04-11 DIAGNOSIS — E039 Hypothyroidism, unspecified: Secondary | ICD-10-CM | POA: Diagnosis not present

## 2017-04-11 DIAGNOSIS — I4510 Unspecified right bundle-branch block: Secondary | ICD-10-CM | POA: Diagnosis not present

## 2017-04-11 DIAGNOSIS — I10 Essential (primary) hypertension: Secondary | ICD-10-CM | POA: Diagnosis not present

## 2017-04-11 DIAGNOSIS — I25119 Atherosclerotic heart disease of native coronary artery with unspecified angina pectoris: Secondary | ICD-10-CM | POA: Diagnosis not present

## 2017-04-11 DIAGNOSIS — Z79899 Other long term (current) drug therapy: Secondary | ICD-10-CM

## 2017-04-11 DIAGNOSIS — Z7901 Long term (current) use of anticoagulants: Secondary | ICD-10-CM | POA: Diagnosis not present

## 2017-04-11 DIAGNOSIS — Z952 Presence of prosthetic heart valve: Secondary | ICD-10-CM

## 2017-04-11 DIAGNOSIS — I4819 Other persistent atrial fibrillation: Secondary | ICD-10-CM

## 2017-04-11 DIAGNOSIS — N400 Enlarged prostate without lower urinary tract symptoms: Secondary | ICD-10-CM | POA: Diagnosis present

## 2017-04-11 DIAGNOSIS — Z950 Presence of cardiac pacemaker: Secondary | ICD-10-CM | POA: Diagnosis not present

## 2017-04-11 DIAGNOSIS — I2510 Atherosclerotic heart disease of native coronary artery without angina pectoris: Secondary | ICD-10-CM | POA: Diagnosis present

## 2017-04-11 DIAGNOSIS — E782 Mixed hyperlipidemia: Secondary | ICD-10-CM | POA: Diagnosis not present

## 2017-04-11 DIAGNOSIS — Z8673 Personal history of transient ischemic attack (TIA), and cerebral infarction without residual deficits: Secondary | ICD-10-CM | POA: Diagnosis not present

## 2017-04-11 DIAGNOSIS — I4891 Unspecified atrial fibrillation: Secondary | ICD-10-CM | POA: Diagnosis not present

## 2017-04-11 DIAGNOSIS — R079 Chest pain, unspecified: Secondary | ICD-10-CM | POA: Diagnosis not present

## 2017-04-11 DIAGNOSIS — E785 Hyperlipidemia, unspecified: Secondary | ICD-10-CM | POA: Diagnosis not present

## 2017-04-11 LAB — BASIC METABOLIC PANEL
ANION GAP: 9 (ref 5–15)
BUN: 12 mg/dL (ref 6–20)
CALCIUM: 9.3 mg/dL (ref 8.9–10.3)
CO2: 26 mmol/L (ref 22–32)
Chloride: 104 mmol/L (ref 101–111)
Creatinine, Ser: 0.94 mg/dL (ref 0.61–1.24)
Glucose, Bld: 127 mg/dL — ABNORMAL HIGH (ref 65–99)
POTASSIUM: 4.4 mmol/L (ref 3.5–5.1)
Sodium: 139 mmol/L (ref 135–145)

## 2017-04-11 LAB — CBC
HCT: 38.9 % — ABNORMAL LOW (ref 39.0–52.0)
Hemoglobin: 12.4 g/dL — ABNORMAL LOW (ref 13.0–17.0)
MCH: 28.4 pg (ref 26.0–34.0)
MCHC: 31.9 g/dL (ref 30.0–36.0)
MCV: 89.2 fL (ref 78.0–100.0)
PLATELETS: 215 10*3/uL (ref 150–400)
RBC: 4.36 MIL/uL (ref 4.22–5.81)
RDW: 13.7 % (ref 11.5–15.5)
WBC: 7.7 10*3/uL (ref 4.0–10.5)

## 2017-04-11 LAB — MAGNESIUM: Magnesium: 2.2 mg/dL (ref 1.7–2.4)

## 2017-04-11 LAB — TSH: TSH: 3.278 u[IU]/mL (ref 0.350–4.500)

## 2017-04-11 MED ORDER — LORAZEPAM 1 MG PO TABS
1.0000 mg | ORAL_TABLET | Freq: Every day | ORAL | Status: DC
  Administered 2017-04-11 – 2017-04-13 (×3): 1 mg via ORAL
  Filled 2017-04-11 (×3): qty 1

## 2017-04-11 MED ORDER — MAGNESIUM OXIDE 400 (241.3 MG) MG PO TABS
400.0000 mg | ORAL_TABLET | Freq: Every day | ORAL | Status: DC
  Administered 2017-04-12 – 2017-04-14 (×3): 400 mg via ORAL
  Filled 2017-04-11 (×3): qty 1

## 2017-04-11 MED ORDER — SODIUM CHLORIDE 0.9% FLUSH
3.0000 mL | Freq: Two times a day (BID) | INTRAVENOUS | Status: DC
  Administered 2017-04-11 – 2017-04-14 (×7): 3 mL via INTRAVENOUS

## 2017-04-11 MED ORDER — ROSUVASTATIN CALCIUM 20 MG PO TABS
20.0000 mg | ORAL_TABLET | Freq: Every day | ORAL | Status: DC
  Administered 2017-04-11 – 2017-04-13 (×3): 20 mg via ORAL
  Filled 2017-04-11 (×3): qty 1

## 2017-04-11 MED ORDER — LORAZEPAM 1 MG PO TABS: 1.0000 mg | ORAL_TABLET | Freq: Every day | ORAL | Status: DC

## 2017-04-11 MED ORDER — METOPROLOL SUCCINATE ER 100 MG PO TB24
100.0000 mg | ORAL_TABLET | Freq: Every day | ORAL | Status: AC
  Administered 2017-04-11: 100 mg via ORAL

## 2017-04-11 MED ORDER — DOFETILIDE 250 MCG PO CAPS: 375.0000 ug | ORAL_CAPSULE | Freq: Two times a day (BID) | ORAL | Status: DC

## 2017-04-11 MED ORDER — HYDROCODONE-ACETAMINOPHEN 10-325 MG PO TABS
1.0000 | ORAL_TABLET | Freq: Two times a day (BID) | ORAL | Status: DC | PRN
  Administered 2017-04-11 – 2017-04-14 (×6): 1 via ORAL
  Filled 2017-04-11 (×6): qty 1

## 2017-04-11 MED ORDER — DOXAZOSIN MESYLATE 4 MG PO TABS
4.0000 mg | ORAL_TABLET | Freq: Every day | ORAL | Status: DC
  Administered 2017-04-11 – 2017-04-13 (×3): 4 mg via ORAL
  Filled 2017-04-11 (×3): qty 1

## 2017-04-11 MED ORDER — DOFETILIDE 500 MCG PO CAPS: 500.0000 ug | ORAL_CAPSULE | Freq: Two times a day (BID) | ORAL | Status: DC

## 2017-04-11 MED ORDER — APIXABAN 5 MG PO TABS
5.0000 mg | ORAL_TABLET | Freq: Two times a day (BID) | ORAL | Status: DC
  Administered 2017-04-11 – 2017-04-14 (×6): 5 mg via ORAL
  Filled 2017-04-11 (×6): qty 1

## 2017-04-11 MED ORDER — DOFETILIDE 500 MCG PO CAPS: 500.0000 ug | ORAL_CAPSULE | Freq: Once | ORAL | Status: DC

## 2017-04-11 MED ORDER — POTASSIUM CHLORIDE CRYS ER 20 MEQ PO TBCR
20.0000 meq | EXTENDED_RELEASE_TABLET | Freq: Every day | ORAL | Status: DC
  Administered 2017-04-12 – 2017-04-14 (×3): 20 meq via ORAL
  Filled 2017-04-11 (×3): qty 1

## 2017-04-11 MED ORDER — METOPROLOL SUCCINATE ER 100 MG PO TB24
100.0000 mg | ORAL_TABLET | Freq: Every day | ORAL | Status: DC
  Administered 2017-04-12 – 2017-04-14 (×3): 100 mg via ORAL
  Filled 2017-04-11 (×3): qty 1

## 2017-04-11 MED ORDER — METOPROLOL SUCCINATE ER 50 MG PO TB24: 50.0000 mg | ORAL_TABLET | Freq: Every day | ORAL | Status: DC

## 2017-04-11 MED ORDER — SODIUM CHLORIDE 0.9% FLUSH
3.0000 mL | INTRAVENOUS | Status: DC | PRN
  Administered 2017-04-14: 3 mL via INTRAVENOUS
  Filled 2017-04-11: qty 3

## 2017-04-11 MED ORDER — BISACODYL 5 MG PO TBEC
5.0000 mg | DELAYED_RELEASE_TABLET | ORAL | Status: DC
  Filled 2017-04-11 (×3): qty 1

## 2017-04-11 MED ORDER — METOPROLOL SUCCINATE ER 100 MG PO TB24
100.0000 mg | ORAL_TABLET | Freq: Every day | ORAL | Status: DC
  Administered 2017-04-12 – 2017-04-13 (×2): 100 mg via ORAL
  Filled 2017-04-11 (×2): qty 1

## 2017-04-11 MED ORDER — BUTALBITAL-APAP-CAFFEINE 50-325-40 MG PO TABS: 1.0000 | ORAL_TABLET | Freq: Two times a day (BID) | ORAL | Status: DC | PRN

## 2017-04-11 MED ORDER — PNEUMOCOCCAL 13-VAL CONJ VACC IM SUSP: 0.5000 mL | INTRAMUSCULAR | Status: DC

## 2017-04-11 MED ORDER — NITROGLYCERIN 0.4 MG SL SUBL: 0.4000 mg | SUBLINGUAL_TABLET | SUBLINGUAL | Status: DC | PRN

## 2017-04-11 MED ORDER — DOFETILIDE 250 MCG PO CAPS: 375.0000 ug | ORAL_CAPSULE | Freq: Once | ORAL | Status: DC

## 2017-04-11 MED ORDER — SODIUM CHLORIDE 0.9 % IV SOLN
250.0000 mL | INTRAVENOUS | Status: DC | PRN
  Administered 2017-04-13: 13:00:00 via INTRAVENOUS

## 2017-04-11 MED ORDER — METOPROLOL SUCCINATE ER 100 MG PO TB24
100.0000 mg | ORAL_TABLET | Freq: Every day | ORAL | Status: DC
  Filled 2017-04-11: qty 1

## 2017-04-11 MED ORDER — DOFETILIDE 500 MCG PO CAPS
500.0000 ug | ORAL_CAPSULE | Freq: Two times a day (BID) | ORAL | Status: DC
  Administered 2017-04-11: 500 ug via ORAL
  Filled 2017-04-11: qty 1

## 2017-04-11 MED ORDER — PREDNISONE 10 MG PO TABS
10.0000 mg | ORAL_TABLET | Freq: Every day | ORAL | Status: DC
  Administered 2017-04-12 – 2017-04-14 (×3): 10 mg via ORAL
  Filled 2017-04-11 (×3): qty 1

## 2017-04-11 MED ORDER — METOPROLOL SUCCINATE ER 50 MG PO TB24: 50.0000 mg | ORAL_TABLET | Freq: Two times a day (BID) | ORAL | Status: DC

## 2017-04-11 MED FILL — BUTALB-ACETAMIN-CAFF 50-325: 50-325-40 | 90 days supply | Qty: 180 | Fill #0

## 2017-04-11 NOTE — H&P (Signed)
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 Lovena Le.. He has h/o of remote amiodarone use. He is aware of purpose for  tikosyn and precautions with use after hospitalization. No missed does of eleiquis, no benadyl use.  Today, he denies symptoms of palpitations, chest pain, shortness of breath, orthopnea, PND, lower extremity edema, dizziness, presyncope, syncope, or neurologic sequela. The patient is tolerating medications without difficulties and is otherwise without complaint today.       Past Medical History:  Diagnosis Date  . Aortic stenosis    moderate aortic stenosis  . Arthritis   . Benign prostatic hypertrophy   . 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  . Coronary artery disease    status post multiple prior percutaneous coronary interventions, microvascular angina per Dr Olevia Perches  . Heart murmur   . Hypercoagulable state ([place])    chronically anticoagulated with coumadin  . Hyperlipidemia   . Hyperthyroidism   . Hypothyroidism    Dr. Elyse Hsu  . MGUS (monoclonal gammopathy of unknown significance) 02/17/2013  . Paroxysmal atrial fibrillation ([place])    DR. Lia Foyer,   . Stroke [facility])    1990        Past Surgical History:  Procedure Laterality Date  . AORTIC VALVE REPLACEMENT  03/15/2011   Procedure: AORTIC VALVE REPLACEMENT (AVR);  Surgeon: Gaye Pollack, MD;  Location: [place];  Service: Open Heart Surgery;  Laterality: N/A;  . APPENDECTOMY    . CARDIAC CATHETERIZATION     13  . CARDIOVERSION    . CARDIOVERSION  04/15/2011   Procedure: CARDIOVERSION;  Surgeon: Loralie Champagne, MD;  Location: [place];  Service: Cardiovascular;  Laterality: N/A;  . CARDIOVERSION N/A 09/11/2014   Procedure:  CARDIOVERSION;  Surgeon: Sueanne Margarita, MD;  Location: [facility] ENDOSCOPY;  Service: Cardiovascular;  Laterality: N/A;  . CARDIOVERSION N/A 06/27/2015   Procedure: CARDIOVERSION;  Surgeon: Thayer Headings, MD;  Location[facility] ENDOSCOPY;  Service: Cardiovascular;  Laterality: N/A;  . CARDIOVERSION N/A 07/04/2015   Procedure: CARDIOVERSION;  Surgeon: Evans Lance, MD;  Location: [place];  Service: Cardiovascular;  Laterality: N/A;  . EP IMPLANTABLE DEVICE N/A 06/16/2015   Procedure: Pacemaker Implant;  Surgeon: Evans Lance, MD;  Location: [place] CV LAB;  Service: Cardiovascular;  Laterality: N/A;  . hemrrhoidectomy    . LEFT AND RIGHT HEART CATHETERIZATION WITH CORONARY ANGIOGRAM Bilateral 02/01/2011   Procedure: LEFT AND RIGHT HEART CATHETERIZATION WITH CORONARY ANGIOGRAM;  Surgeon: Hillary Bow, MD;  Location: [facility] CATH LAB;  Service: Cardiovascular;  Laterality: Bilateral;  . MAZE  03/15/2011   Procedure: MAZE;  Surgeon: Gaye Pollack, MD;  Location: [place];  Service: Open Heart Surgery;  Laterality: N/A;  . PACEMAKER INSERTION  1991   Guidant PPM, most recent Generator Change by Dr Olevia Perches was 08/22/06  . RIGHT/LEFT HEART CATH AND CORONARY ANGIOGRAPHY N/A 07/06/2016   Procedure: Right/Left Heart Cath and Coronary Angiography;  Surgeon: Sherren Mocha, MD;  Location: [place] CV LAB;  Service: Cardiovascular;  Laterality: N/A;  . TEE WITHOUT CARDIOVERSION  04/15/2011   Procedure: TRANSESOPHAGEAL ECHOCARDIOGRAM (TEE);  Surgeon: Loralie Champagne, MD;  Location: [place];  Service: Cardiovascular;  Laterality: N/A;  . TEE WITHOUT CARDIOVERSION N/A 09/11/2014   Procedure: TRANSESOPHAGEAL ECHOCARDIOGRAM (TEE);  Surgeon: Sueanne Margarita, MD;  Location: [facility] ENDOSCOPY;  Service: Cardiovascular;  Laterality: N/A;          Current Outpatient Medications  Medication Sig Dispense Refill  . apixaban (ELIQUIS) 5 MG TABS tablet Take 1 tablet (5 mg total) by mouth 2 (two) times daily. 180  tablet 3  . bisacodyl (DULCOLAX) 5 MG EC tablet Take 5 mg by mouth every other day.     . butalbital-acetaminophen-caffeine (FIORICET, ESGIC) 50-325-40 MG per tablet Take 1 tablet by mouth 2 (two) times daily as needed (osteo-arthritis pain).     . Cholecalciferol (VITAMIN D3) 2000 units capsule Take 1 capsule by mouth daily.    . Diclofenac Sodium 1.5 % SOLN Take 1 application by mouth 2 (two) times daily as needed (Knee pain).     Marland Kitchen doxazosin (CARDURA) 4 MG tablet Take 4 mg by mouth at bedtime.     Marland Kitchen HYDROcodone-acetaminophen (NORCO) 10-325 MG tablet Take 1 tablet by mouth 2 (two) times daily as needed. 100 tablet 0  . LORazepam (ATIVAN) 1 MG tablet Take 1 mg by mouth at bedtime.     . magnesium oxide (MAG-OX) 400 MG tablet Take 400 mg by mouth daily.    . metoprolol succinate (TOPROL-XL) 100 MG 24 hr tablet Take 50-100 mg by mouth 2 (two) times daily. Take 100 mg by mouth in the morning and 50 mg by mouth at night. Take with or immediately following a meal.    . nitroGLYCERIN (NITROSTAT) 0.4 MG SL tablet Place 1 tablet (0.4 mg total) under the tongue every 5 (five) minutes as needed for chest pain. 25 tablet 3  . OVER THE COUNTER MEDICATION Apply 1 patch topically daily as needed (neck and shoulder pain). Chinese pain patch    . potassium chloride SA (K-DUR,KLOR-CON) 20 MEQ tablet TAKE 1 TABLET BY MOUTH ONCE DAILY 90 tablet 3  . predniSONE (DELTASONE) 10 MG tablet TAKE 1 TABLET BY MOUTH ONCE DAILY WITH BREAKFAST 30 tablet 5  . rosuvastatin (CRESTOR) 20 MG tablet TAKE 1 TABLET BY MOUTH AT BEDTIME. 90 tablet 2  . HYDROcodone-acetaminophen (NORCO) 10-325 MG tablet TAKE 1 TABLET BY MOUTH TWICE DAILY AS NEEDED (Patient not taking: Reported on 04/11/2017) 100 tablet 0   No current facility-administered medications for this encounter.         Allergies  Allergen Reactions  . Contrast Media [Iodinated Diagnostic Agents] Hives  . Gadolinium Derivatives Hives  . Metrizamide  Hives    Social History        Socioeconomic History  . Marital status: Married    Spouse name: Not on file  . Number of children: Not on file  . Years of education: Not on file  . Highest education level: Not on file  Social Needs  . Financial resource strain: Not on file  . Food insecurity - worry: Not on file  . Food insecurity - inability: Not on file  . Transportation needs - medical: Not on file  . Transportation needs - non-medical: Not on file  Occupational History  . Not on file  Tobacco Use  . Smoking status: Never Smoker  . Smokeless tobacco: Never Used  Substance and Sexual Activity  . Alcohol use: No    Alcohol/week: 0.0 oz  . Drug use: No  . Sexual activity: Not on file  Other Topics Concern  . Not on file  Social History Narrative   Married. Has grown children   Retired Horticulturist, commercial MD  Family History  Problem Relation Age of Onset  . Heart disease Brother        Twin brother has coronary disease and recent AVR for AS  . Anesthesia problems Neg Hx   . Hypotension Neg Hx   . Malignant hyperthermia Neg Hx   . Pseudochol deficiency Neg Hx     ROS- All systems are reviewed and negative except as per the HPI above  Physical Exam:    Vitals:   04/11/17 0922  BP: 126/84  Pulse: 79  Weight: 230 lb (104.3 kg)  Height: 6\' 2"  (1.88 m)      Wt Readings from Last 3 Encounters:  04/11/17 230 lb (104.3 kg)  04/05/17 222 lb (100.7 kg)  03/31/17 228 lb (103.4 kg)    Labs: RecentLabs       Lab Results  Component Value Date   NA 139 04/11/2017   K 4.4 04/11/2017   CL 104 04/11/2017   CO2 26 04/11/2017   GLUCOSE 127 (H) 04/11/2017   BUN 12 04/11/2017   CREATININE 0.94 04/11/2017   CALCIUM 9.3 04/11/2017   MG 2.2 04/11/2017     RecentLabs       Lab Results  Component Value Date   INR 1.1 07/02/2016     RecentLabs       Lab Results  Component Value Date   CHOL 162 07/02/2016   HDL  71 07/02/2016   LDLCALC 67 07/02/2016   TRIG 120 07/02/2016       GEN- The patient is well appearing, alert and oriented x 3 today.   Head- normocephalic, atraumatic Eyes-  Sclera clear, conjunctiva pink Ears- hearing intact Oropharynx- clear Neck- supple, no JVP Lymph- no cervical lymphadenopathy Lungs- Clear to ausculation bilaterally, normal work of breathing Heart-irregular rate and rhythm, no murmurs, rubs or gallops, PMI not laterally displaced GI- soft, NT, ND, + BS Extremities- no clubbing, cyanosis, or edema MS- no significant deformity or atrophy Skin- no rash or lesion Psych- euthymic mood, full affect Neuro- strength and sensation are intact  EKG-afib at 79 bpm, RBBB, LAFB, qrs int 139 bpm, qtc 497 ms Epic records reviewed    Assessment and Plan: 1. Persistent  afib Admission for this am for Tikosyn admit Bmet/mag pending, pt requested thyroid panel/ CBC to be drawn as well No missed doses of eliquis for at least 3 weeks No benadryl use Drugs reviewed by PharmD and no qtc prolonging drugs on board Tikosyn precautions discussed Qtc 497 ms but with presence of RBBB/LAFB   Labs  today show K+ at 4.4 and mag at 2.2, crcl cal at 230 ml/mim To be admitted to [place]. [facility] [address] [place], [place] [id number] [phone]  EP Attending  Patient seen and examined. Discussed plans for initiation of dofetilide. He is a bit anxious. His QTC after first dose around 490 in setting of QRS of 140. Will reduce his dose to 375 q12, increase his toprol back to 100 bid and follow. DC CV planned on Wednesday if still on dofetilide if he has not converted back to NSR.  Mikle Bosworth.D.

## 2017-04-11 NOTE — Progress Notes (Signed)
Primary Care Physician: [name], MD Referring Physician: Dr. Gladstone Lighter, MD is a 83 y.o. male with a h/o persistent Afib that is in the Afib clinic for admission for Tikosyn per Dr. Lovena Le.. He has h/o of remote amiodarone use. He is aware of purpose for  tikosyn and precautions with use after hospitalization. No missed does of eleiquis, no benadyl use.  Today, he denies symptoms of palpitations, chest pain, shortness of breath, orthopnea, PND, lower extremity edema, dizziness, presyncope, syncope, or neurologic sequela. The patient is tolerating medications without difficulties and is otherwise without complaint today.   Past Medical History:  Diagnosis Date  . Aortic stenosis    moderate aortic stenosis  . Arthritis   . Benign prostatic hypertrophy   . 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  . Coronary artery disease    status post multiple prior percutaneous coronary interventions, microvascular angina per Dr Olevia Perches  . Heart murmur   . Hypercoagulable state ([place])    chronically anticoagulated with coumadin  . Hyperlipidemia   . Hyperthyroidism   . Hypothyroidism    Dr. Elyse Hsu  . MGUS (monoclonal gammopathy of unknown significance) 02/17/2013  . Paroxysmal atrial fibrillation ([place])    DR. Lia Foyer,   . Stroke [facility])    1990   Past Surgical History:  Procedure Laterality Date  . AORTIC VALVE REPLACEMENT  03/15/2011   Procedure: AORTIC VALVE REPLACEMENT (AVR);  Surgeon: Gaye Pollack, MD;  Location: [place];  Service: Open Heart Surgery;  Laterality: N/A;  . APPENDECTOMY    . CARDIAC CATHETERIZATION     11  . CARDIOVERSION    . CARDIOVERSION  04/15/2011   Procedure: CARDIOVERSION;  Surgeon: Loralie Champagne, MD;  Location: [place];  Service: Cardiovascular;  Laterality: N/A;  .  CARDIOVERSION N/A 09/11/2014   Procedure: CARDIOVERSION;  Surgeon: Sueanne Margarita, MD;  Location: [facility] ENDOSCOPY;  Service: Cardiovascular;  Laterality: N/A;  . CARDIOVERSION N/A 06/27/2015   Procedure: CARDIOVERSION;  Surgeon: Thayer Headings, MD;  Location: [facility] ENDOSCOPY;  Service: Cardiovascular;  Laterality: N/A;  . CARDIOVERSION N/A 07/04/2015   Procedure: CARDIOVERSION;  Surgeon: Evans Lance, MD;  Location: [place];  Service: Cardiovascular;  Laterality: N/A;  . EP IMPLANTABLE DEVICE N/A 06/16/2015   Procedure: Pacemaker Implant;  Surgeon: Evans Lance, MD;  Location: [place] CV LAB;  Service: Cardiovascular;  Laterality: N/A;  . hemrrhoidectomy    . LEFT AND RIGHT HEART CATHETERIZATION WITH CORONARY ANGIOGRAM Bilateral 02/01/2011   Procedure: LEFT AND RIGHT HEART CATHETERIZATION WITH CORONARY ANGIOGRAM;  Surgeon: Hillary Bow, MD;  Location: [facility] CATH LAB;  Service: Cardiovascular;  Laterality: Bilateral;  . MAZE  03/15/2011   Procedure: MAZE;  Surgeon: Gaye Pollack, MD;  Location: [place];  Service: Open Heart Surgery;  Laterality: N/A;  . PACEMAKER INSERTION  1991   Guidant PPM, most recent Generator Change by Dr Olevia Perches was 08/22/06  . RIGHT/LEFT HEART CATH AND CORONARY ANGIOGRAPHY N/A 07/06/2016   Procedure: Right/Left Heart Cath and Coronary Angiography;  Surgeon: Sherren Mocha, MD;  Location: [place] CV LAB;  Service: Cardiovascular;  Laterality: N/A;  . TEE WITHOUT CARDIOVERSION  04/15/2011   Procedure: TRANSESOPHAGEAL ECHOCARDIOGRAM (TEE);  Surgeon: Loralie Champagne, MD;  Location: [place];  Service: Cardiovascular;  Laterality: N/A;  . TEE WITHOUT CARDIOVERSION N/A 09/11/2014  Procedure: TRANSESOPHAGEAL ECHOCARDIOGRAM (TEE);  Surgeon: Sueanne Margarita, MD;  Location: [facility] ENDOSCOPY;  Service: Cardiovascular;  Laterality: N/A;    Current Outpatient Medications  Medication Sig Dispense Refill  . apixaban (ELIQUIS) 5 MG TABS tablet Take 1 tablet (5 mg total) by mouth 2  (two) times daily. 180 tablet 3  . bisacodyl (DULCOLAX) 5 MG EC tablet Take 5 mg by mouth every other day.     . butalbital-acetaminophen-caffeine (FIORICET, ESGIC) 50-325-40 MG per tablet Take 1 tablet by mouth 2 (two) times daily as needed (osteo-arthritis pain).     . Cholecalciferol (VITAMIN D3) 2000 units capsule Take 1 capsule by mouth daily.    . Diclofenac Sodium 1.5 % SOLN Take 1 application by mouth 2 (two) times daily as needed (Knee pain).     Marland Kitchen doxazosin (CARDURA) 4 MG tablet Take 4 mg by mouth at bedtime.     Marland Kitchen HYDROcodone-acetaminophen (NORCO) 10-325 MG tablet Take 1 tablet by mouth 2 (two) times daily as needed. 100 tablet 0  . LORazepam (ATIVAN) 1 MG tablet Take 1 mg by mouth at bedtime.     . magnesium oxide (MAG-OX) 400 MG tablet Take 400 mg by mouth daily.    . metoprolol succinate (TOPROL-XL) 100 MG 24 hr tablet Take 50-100 mg by mouth 2 (two) times daily. Take 100 mg by mouth in the morning and 50 mg by mouth at night. Take with or immediately following a meal.    . nitroGLYCERIN (NITROSTAT) 0.4 MG SL tablet Place 1 tablet (0.4 mg total) under the tongue every 5 (five) minutes as needed for chest pain. 25 tablet 3  . OVER THE COUNTER MEDICATION Apply 1 patch topically daily as needed (neck and shoulder pain). Chinese pain patch    . potassium chloride SA (K-DUR,KLOR-CON) 20 MEQ tablet TAKE 1 TABLET BY MOUTH ONCE DAILY 90 tablet 3  . predniSONE (DELTASONE) 10 MG tablet TAKE 1 TABLET BY MOUTH ONCE DAILY WITH BREAKFAST 30 tablet 5  . rosuvastatin (CRESTOR) 20 MG tablet TAKE 1 TABLET BY MOUTH AT BEDTIME. 90 tablet 2  . HYDROcodone-acetaminophen (NORCO) 10-325 MG tablet TAKE 1 TABLET BY MOUTH TWICE DAILY AS NEEDED (Patient not taking: Reported on 04/11/2017) 100 tablet 0   No current facility-administered medications for this encounter.     Allergies  Allergen Reactions  . Contrast Media [Iodinated Diagnostic Agents] Hives  . Gadolinium Derivatives Hives  . Metrizamide Hives      Social History   Socioeconomic History  . Marital status: Married    Spouse name: Not on file  . Number of children: Not on file  . Years of education: Not on file  . Highest education level: Not on file  Social Needs  . Financial resource strain: Not on file  . Food insecurity - worry: Not on file  . Food insecurity - inability: Not on file  . Transportation needs - medical: Not on file  . Transportation needs - non-medical: Not on file  Occupational History  . Not on file  Tobacco Use  . Smoking status: Never Smoker  . Smokeless tobacco: Never Used  Substance and Sexual Activity  . Alcohol use: No    Alcohol/week: 0.0 oz  . Drug use: No  . Sexual activity: Not on file  Other Topics Concern  . Not on file  Social History Narrative   Married. Has grown children   Retired Horticulturist, commercial MD       Family History  Problem Relation Age of  Onset  . Heart disease Brother        Twin brother has coronary disease and recent AVR for AS  . Anesthesia problems Neg Hx   . Hypotension Neg Hx   . Malignant hyperthermia Neg Hx   . Pseudochol deficiency Neg Hx     ROS- All systems are reviewed and negative except as per the HPI above  Physical Exam: Vitals:   04/11/17 0922  BP: 126/84  Pulse: 79  Weight: 230 lb (104.3 kg)  Height: 6\' 2"  (1.88 m)   Wt Readings from Last 3 Encounters:  04/11/17 230 lb (104.3 kg)  04/05/17 222 lb (100.7 kg)  03/31/17 228 lb (103.4 kg)    Labs: Lab Results  Component Value Date   NA 139 04/11/2017   K 4.4 04/11/2017   CL 104 04/11/2017   CO2 26 04/11/2017   GLUCOSE 127 (H) 04/11/2017   BUN 12 04/11/2017   CREATININE 0.94 04/11/2017   CALCIUM 9.3 04/11/2017   MG 2.2 04/11/2017   Lab Results  Component Value Date   INR 1.1 07/02/2016   Lab Results  Component Value Date   CHOL 162 07/02/2016   HDL 71 07/02/2016   LDLCALC 67 07/02/2016   TRIG 120 07/02/2016     GEN- The patient is well appearing, alert and oriented x 3  today.   Head- normocephalic, atraumatic Eyes-  Sclera clear, conjunctiva pink Ears- hearing intact Oropharynx- clear Neck- supple, no JVP Lymph- no cervical lymphadenopathy Lungs- Clear to ausculation bilaterally, normal work of breathing Heart-irregular rate and rhythm, no murmurs, rubs or gallops, PMI not laterally displaced GI- soft, NT, ND, + BS Extremities- no clubbing, cyanosis, or edema MS- no significant deformity or atrophy Skin- no rash or lesion Psych- euthymic mood, full affect Neuro- strength and sensation are intact  EKG-afib at 79 bpm, RBBB, LAFB, qrs int 139 bpm, qtc 497 ms Epic records reviewed    Assessment and Plan: 1. Persistent  afib Admission for this am for Tikosyn admit Bmet/mag pending, pt requested thyroid panel/ CBC to be drawn as well No missed doses of eliquis for at least 3 weeks No benadryl use Drugs reviewed by PharmD and no qtc prolonging drugs on board Tikosyn precautions discussed Qtc 497 ms but with presence of RBBB/LAFB   Labs  today show K+ at 4.4 and mag at 2.2, crcl cal at 230 ml/mim To be admitted to [place]. Lauranne Beyersdorf, [facility] [address] [place], [place] [address] [phone]

## 2017-04-11 NOTE — Progress Notes (Signed)
Pharmacy Review for Dofetilide (Tikosyn) Initiation  Admit Complaint: 83 y.o. male admitted 04/11/2017 with atrial fibrillation to be initiated on dofetilide.   Assessment:  Patient Exclusion Criteria: If any screening criteria checked as "Yes", then  patient  should NOT receive dofetilide until criteria item is corrected. If "Yes" please indicate correction plan.  YES  NO Patient  Exclusion Criteria Correction Plan  [x]  []  Baseline QTc interval is greater than or equal to 440 msec. IF above YES box checked dofetilide contraindicated unless patient has ICD; then may proceed if QTc 500-550 msec or with known ventricular conduction abnormalities may proceed with QTc 550-600 msec. QTc = Qtc 497 ms but with presence of RBBB/LAFB   MD aware  []  [x]  Magnesium level is less than 1.8 mEq/l : Last magnesium:  Lab Results  Component Value Date   MG 2.2 04/11/2017         []  [x]  Potassium level is less than 4 mEq/l : Last potassium:  Lab Results  Component Value Date   K 4.4 04/11/2017         []  [x]  Patient is known or suspected to have a digoxin level greater than 2 ng/ml: No results found for: DIGOXIN    []  [x]  Creatinine clearance less than 20 ml/min (calculated using Cockcroft-Gault, actual body weight and serum creatinine): Estimated Creatinine Clearance: 80.7 mL/min (by C-G formula based on SCr of 0.94 mg/dL).    []  [x]  Patient has received drugs known to prolong the QT intervals within the last 48 hours (phenothiazines, tricyclics or tetracyclic antidepressants, erythromycin, H-1 antihistamines, cisapride, fluoroquinolones, azithromycin). Drugs not listed above may have an, as yet, undetected potential to prolong the QT interval, updated information on QT prolonging agents is available at this website:QT prolonging agents   []  [x]  Patient received a dose of hydrochlorothiazide (Oretic) alone or in any combination including triamterene (Dyazide, Maxzide) in the last 48 hours.   []  [x]   Patient received a medication known to increase dofetilide plasma concentrations prior to initial dofetilide dose:  . Trimethoprim (Primsol, Proloprim) in the last 36 hours . Verapamil (Calan, Verelan) in the last 36 hours or a sustained release dose in the last 72 hours . Megestrol (Megace) in the last 5 days  . Cimetidine (Tagamet) in the last 6 hours . Ketoconazole (Nizoral) in the last 24 hours . Itraconazole (Sporanox) in the last 48 hours  . Prochlorperazine (Compazine) in the last 36 hours    []  [x]  Patient is known to have a history of torsades de pointes; congenital or acquired long QT syndromes.   []  [x]  Patient has received a Class 1 antiarrhythmic with less than 2 half-lives since last dose. (Disopyramide, Quinidine, Procainamide, Lidocaine, Mexiletine, Flecainide, Propafenone)   []  [x]  Patient has received amiodarone therapy in the past 3 months or amiodarone level is greater than 0.3 ng/ml.    Patient has been appropriately anticoagulated with apixaban.  Ordering provider was confirmed at [email or web address] if they are not listed on the [facility] Prescribers list.  Goal of Therapy: Follow renal function, electrolytes, potential drug interactions, and dose adjustment. Provide education and 1 week supply at discharge.  Plan:  [x]   Physician selected initial dose within range recommended for patients level of renal function - will monitor for response.  []   Physician selected initial dose outside of range recommended for patients level of renal function - will discuss if the dose should be altered at this time.   Select One Calculated CrCl  Dose  q12h  [x]  > 60 ml/min 500 mcg  []  40-60 ml/min 250 mcg  []  20-40 ml/min 125 mcg   2. Follow up QTc after the first 5 doses, renal function, electrolytes (K & Mg) daily x 3     days, dose adjustment, success of initiation and facilitate 1 week discharge supply as     clinically indicated.  Erin Hearing Rhea 1:20 PM  04/11/2017

## 2017-04-12 ENCOUNTER — Encounter (HOSPITAL_COMMUNITY): Payer: Self-pay

## 2017-04-12 ENCOUNTER — Other Ambulatory Visit: Payer: Self-pay

## 2017-04-12 LAB — BASIC METABOLIC PANEL
Anion gap: 9 (ref 5–15)
BUN: 10 mg/dL (ref 6–20)
CO2: 26 mmol/L (ref 22–32)
Calcium: 8.9 mg/dL (ref 8.9–10.3)
Chloride: 106 mmol/L (ref 101–111)
Creatinine, Ser: 1.02 mg/dL (ref 0.61–1.24)
Glucose, Bld: 109 mg/dL — ABNORMAL HIGH (ref 65–99)
POTASSIUM: 3.9 mmol/L (ref 3.5–5.1)
SODIUM: 141 mmol/L (ref 135–145)

## 2017-04-12 LAB — MAGNESIUM: MAGNESIUM: 2.1 mg/dL (ref 1.7–2.4)

## 2017-04-12 MED ORDER — HYDRALAZINE HCL 20 MG/ML IJ SOLN
10.0000 mg | Freq: Three times a day (TID) | INTRAMUSCULAR | Status: DC | PRN
  Administered 2017-04-12: 10 mg via INTRAVENOUS
  Filled 2017-04-12: qty 1

## 2017-04-12 MED ORDER — HYDRALAZINE HCL 20 MG/ML IJ SOLN
10.0000 mg | Freq: Once | INTRAMUSCULAR | Status: AC
  Administered 2017-04-12: 10 mg via INTRAVENOUS
  Filled 2017-04-12: qty 1

## 2017-04-12 MED ORDER — DOFETILIDE 250 MCG PO CAPS
250.0000 ug | ORAL_CAPSULE | Freq: Two times a day (BID) | ORAL | Status: DC
  Administered 2017-04-12 – 2017-04-14 (×5): 250 ug via ORAL
  Filled 2017-04-12 (×5): qty 1

## 2017-04-12 NOTE — Care Management Note (Signed)
Case Management Note  Patient Details  Name: Kevin SAINDON, [name] MRN: [id number] Date of Birth: 1934-05-01  Subjective/Objective: Pt presented for Tikosyn Load- Persistent Atrial Fib. Plan for home on Tikosyn. PTA Independent from home with wife.  Benefits Check completed. CM will make pt aware of cost below and check with pharmacy for availability. Pt will need a Rx for 7 day supply no refills and the original Rx with refills. CM will assist with the Rx for 7 day supply via [place]. No further needs from CM at this time.                     Action/Plan: S/W JOAN @ [address] RX [facility] ([employer]) / [facility] # [phone]    1. TIKOSYN 500 MCG BID  [place] # [phone]    2. DOFITILIDE 500 MCG BID  COVER- YES  CO-PAY- 33 % OF TOTAL COST  TIER- 4 DRUG  PRIOR APPROVAL- NO  DEDUCTIBLE - NOT MET   PREFERRED PHARMACY : [facility]  Expected Discharge Date:  04/14/17               Expected Discharge Plan:  Home/Self Care  In-House Referral:  NA  Discharge planning Services  CM Consult, Medication Assistance  Post Acute Care Choice:  NA Choice offered to:  NA  DME Arranged:  N/A DME Agency:  NA  HH Arranged:  NA HH Agency:  NA  Status of Service:  Completed, signed off  If discussed at [place] of Stay Meetings, dates discussed:    Additional Comments: 1042 04-12-17 Jacqlyn Krauss, RN,BSN [phone] Pt utilizes [place] to call to make sure pharmacy can order. Bethena Roys, RN 04/12/2017, 10:29 AM

## 2017-04-12 NOTE — Progress Notes (Signed)
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  At this time, the patient denies chest pain, shortness of breath, or any new concerns except that his blood pressure is elevated. Asymptomatic  Inpatient Medications    Scheduled Meds: . apixaban  5 mg Oral BID  . bisacodyl  5 mg Oral QODAY  . dofetilide  250 mcg Oral BID  . doxazosin  4 mg Oral QHS  . hydrALAZINE  10 mg Intravenous Once  . LORazepam  1 mg Oral QHS  . magnesium oxide  400 mg Oral Daily  . metoprolol succinate  100 mg Oral QHS   And  . metoprolol succinate  100 mg Oral Daily  . potassium chloride SA  20 mEq Oral Daily  . predniSONE  10 mg Oral Q breakfast  . rosuvastatin  20 mg Oral QHS  . sodium chloride flush  3 mL Intravenous Q12H   Continuous Infusions: . sodium chloride     PRN Meds: sodium chloride, butalbital-acetaminophen-caffeine, HYDROcodone-acetaminophen, nitroGLYCERIN, sodium chloride flush   Vital Signs    Vitals:   04/11/17 1848 04/11/17 1937 04/11/17 2102 04/12/17 0520  BP: (!) 146/99 (!) 133/98 136/78 (!) 160/95  Pulse: (!) 114 (!) 114  (!) 110  Resp:  19  17  Temp:  (!) 97.5 F (36.4 C)  98.2 F (36.8 C)  TempSrc:  Oral  Oral  SpO2:    97%  Weight:    230 lb (104.3 kg)    Intake/Output Summary (Last 24 hours) at 04/12/2017 0742 Last data filed at 04/11/2017 2200 Gross per 24 hour  Intake 480 ml  Output 250 ml  Net 230 ml   Filed Weights   04/12/17 0520  Weight: 230 lb (104.3 kg)    Physical Exam    GEN- The patient is well appearing, alert and oriented x 3 today.   Head- normocephalic, atraumatic Eyes-  Sclera clear, conjunctiva pink Ears- hearing intact Oropharynx- clear Neck- supple Lungs- Clear to ausculation bilaterally, normal work of breathing Heart- Irregular rate and rhythm  GI- soft, NT, ND, + BS Extremities- no clubbing,  cyanosis, or edema Skin- no rash or lesion Psych- euthymic mood, full affect Neuro- strength and sensation are intact  Labs    CBC Recent Labs    04/11/17 0917  WBC 7.7  HGB 12.4*  HCT 38.9*  MCV 89.2  PLT 149   Basic Metabolic Panel Recent Labs    04/11/17 0917 04/12/17 0350  NA 139 141  K 4.4 3.9  CL 104 106  CO2 26 26  GLUCOSE 127* 109*  BUN 12 10  CREATININE 0.94 1.02  CALCIUM 9.3 8.9  MG 2.2 2.1   Thyroid Function Tests Recent Labs    04/11/17 0930  TSH 3.278    Telemetry    Atrial tach/atypical flutter (personally reviewed)  Radiology    No results found.   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  Persistent atrial arrhythmias Admitted for Tikosyn load QTc prolonged yesterday, ok this morning Will resume at 251mcg twice daily and follow Labs ok Continue Eliquis  2.  HTN BP elevated this morning Will give 1 dose of IV hydralazine   Signed, Chanetta Marshall, NP  04/12/2017, 7:42 AM   EP Attending  Patient seen and examined. Agree with above. His QT went out a bit and his dofetilide was held. He will restart 250 bid. Hydralazine for his elevated blood pressure. Will plan DCCV tomorrow or Thursday.  Mikle Bosworth.D.

## 2017-04-12 NOTE — Progress Notes (Signed)
HR around 120s and sustained.  Pt c/o pressure in his chest and anxiety. BP went up to 153/109. Denies SOB. Paged EP.

## 2017-04-13 ENCOUNTER — Inpatient Hospital Stay (HOSPITAL_COMMUNITY): Payer: Medicare Other | Admitting: Anesthesiology

## 2017-04-13 ENCOUNTER — Encounter (HOSPITAL_COMMUNITY): Payer: Self-pay

## 2017-04-13 ENCOUNTER — Encounter (HOSPITAL_COMMUNITY)
Admission: RE | Disposition: A | Discharge status: Home / Self Care | Payer: Self-pay | Source: Ambulatory Visit | Attending: Internal Medicine

## 2017-04-13 ENCOUNTER — Ambulatory Visit (HOSPITAL_COMMUNITY): Admit: 2017-04-13 | Payer: Medicare Other | Admitting: Cardiology

## 2017-04-13 DIAGNOSIS — I4891 Unspecified atrial fibrillation: Secondary | ICD-10-CM

## 2017-04-13 HISTORY — PX: CARDIOVERSION: SHX1299

## 2017-04-13 LAB — BASIC METABOLIC PANEL
Anion gap: 10 (ref 5–15)
BUN: 11 mg/dL (ref 6–20)
CALCIUM: 8.8 mg/dL — AB (ref 8.9–10.3)
CO2: 23 mmol/L (ref 22–32)
CREATININE: 0.87 mg/dL (ref 0.61–1.24)
Chloride: 105 mmol/L (ref 101–111)
GFR calc non Af Amer: >60 mL/min (ref >60)
Glucose, Bld: 101 mg/dL — ABNORMAL HIGH (ref 65–99)
Potassium: 3.4 mmol/L — ABNORMAL LOW (ref 3.5–5.1)
Sodium: 138 mmol/L (ref 135–145)

## 2017-04-13 LAB — MAGNESIUM: Magnesium: 2.3 mg/dL (ref 1.7–2.4)

## 2017-04-13 SURGERY — CARDIOVERSION
Anesthesia: General

## 2017-04-13 MED ORDER — POTASSIUM CHLORIDE CRYS ER 20 MEQ PO TBCR: 40.0000 meq | EXTENDED_RELEASE_TABLET | Freq: Once | ORAL | Status: DC

## 2017-04-13 MED ORDER — LIDOCAINE 2% (20 MG/ML) 5 ML SYRINGE
INTRAMUSCULAR | Status: DC | PRN
  Administered 2017-04-13: 100 mg via INTRAVENOUS

## 2017-04-13 MED ORDER — PROPOFOL 10 MG/ML IV BOLUS
INTRAVENOUS | Status: DC | PRN
  Administered 2017-04-13: 80 mg via INTRAVENOUS

## 2017-04-13 MED ORDER — POTASSIUM CHLORIDE CRYS ER 20 MEQ PO TBCR
40.0000 meq | EXTENDED_RELEASE_TABLET | Freq: Once | ORAL | Status: AC
  Administered 2017-04-13: 40 meq via ORAL
  Filled 2017-04-13: qty 2

## 2017-04-13 NOTE — Consult Note (Signed)
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 Patient reportshaving"irregular heart beats"thathad led to this admission.  Patientendorses Dr. Legrand Como Altheimer with [facility] Endocrinology under [place] care provider.   [facility]. to obtain medications without difficulty.   Patient reportsthat hehas beenmanaginghis medications at homewith use of "pillbox" system filled every 2 weeks.  Patientverbalized that he was driving prior to admission but wife Baker Janus) or friend Maudie Mercury) will be ableprovidetransportationto his doctors' appointmentsif needed after discharge.  His wife will bethe primary caregiverat home if needed.  Anticipateddischarge planishomeaccording to patient.  Patientvoiced understanding to call primary care provider's officewhen hereturnshometo schedule a post discharge follow-up visit within1-2weeksor sooner if needs arise.Patient letter (with PCP's contact number) was provided asareminder.   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 His wife and other support people will be able to assist him with health management at homeas stated. Patient denies any current needs or concerns at this point.   For additional questions please contact:  Edwena Felty A. Ayodele Hartsock, BSN, RN-BC [facility] PRIMARY CARE Navigator Cell: [phone]

## 2017-04-13 NOTE — Anesthesia Procedure Notes (Signed)
Procedure Name: General with mask airway Date/Time: 04/13/2017 1:38 PM Performed by: Wilburn Cornelia, CRNA Pre-anesthesia Checklist: Patient identified, Emergency Drugs available, Suction available, Patient being monitored and Timeout performed Patient Re-evaluated:Patient Re-evaluated prior to induction Oxygen Delivery Method: Ambu bag Preoxygenation: Pre-oxygenation with 100% oxygen Induction Type: IV induction Ventilation: Mask ventilation without difficulty Placement Confirmation: positive ETCO2,  breath sounds checked- equal and bilateral and CO2 detector Dental Injury: Teeth and Oropharynx as per pre-operative assessment

## 2017-04-13 NOTE — Anesthesia Postprocedure Evaluation (Signed)
Anesthesia Post Note  Patient: [name], MD  Procedure(s) Performed: CARDIOVERSION (N/A )     Patient location during evaluation: PACU Anesthesia Type: General Level of consciousness: awake and alert Pain management: pain level controlled Vital Signs Assessment: post-procedure vital signs reviewed and stable Respiratory status: spontaneous breathing, nonlabored ventilation and respiratory function stable Cardiovascular status: blood pressure returned to baseline and stable Postop Assessment: no apparent nausea or vomiting Anesthetic complications: no    Last Vitals:  Vitals:   04/13/17 1405 04/13/17 1424  BP: 137/80 (!) 144/85  Pulse: (!) 59 60  Resp: 14   Temp:  36.6 C  SpO2: 94% 97%    Last Pain:  Vitals:   04/13/17 1424  TempSrc: Oral  PainSc:                  Lynda Rainwater

## 2017-04-13 NOTE — Interval H&P Note (Signed)
History and Physical Interval Note:  04/13/2017 12:48 PM  [name], MD  has presented today for surgery, with the diagnosis of a fib  The various methods of treatment have been discussed with the patient and family. After consideration of risks, benefits and other options for treatment, the patient has consented to  Procedure(s): CARDIOVERSION (N/A) as a surgical intervention .  The patient's history has been reviewed, patient examined, no change in status, stable for surgery.  I have reviewed the patient's chart and labs.  Questions were answered to the patient's satisfaction.     Fransico Him

## 2017-04-13 NOTE — CV Procedure (Signed)
   Electrical Cardioversion Procedure Note KEIJUAN SCHELLHASE, MD [id number] [birth year]  Procedure: Electrical Cardioversion Indications:  Atrial Fibrillation  Time Out: Verified patient identification, verified procedure,medications/allergies/relevent history reviewed, required imaging and test results available.  Performed  Procedure Details  The patient was NPO after midnight. Anesthesia was administered at the beside  by Dr. Sabra Heck with 80mg  of propofol and 100mg  Lidocaine.   Cardioversion was done with synchronized biphasic defibrillation with AP pads with 150watts.  The patient converted to normal sinus rhythm. The patient tolerated the procedure well   IMPRESSION:  Successful cardioversion of atrial fibrillation    Traci Turner 04/13/2017, 12:48 PM

## 2017-04-13 NOTE — Discharge Summary (Signed)
ELECTROPHYSIOLOGY PROCEDURE DISCHARGE SUMMARY    Patient ID: Kevin Buff, [name],  MRN: [id number], DOB/AGE: 02-Sep-1934 83 y.o.  Admit date: 04/11/2017 Discharge date: 04/14/17  Primary Care Physician: [name], [name] Primary Cardiologist: Dr. Burt Knack Electrophysiologist: Dr. Lovena Le  Primary Discharge Diagnosis:  1.  persistent atrial fibrillation status post Tikosyn loading this admission      CHA2DS2Vasc is 5, on eliquis  Secondary Discharge Diagnosis:  1. Sinus node dysfunctionw/PPM 2. 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 CAD   Allergies  Allergen Reactions  . Contrast Media [Iodinated Diagnostic Agents] Hives  . Gadolinium Derivatives Hives  . Metrizamide Hives     Procedures This Admission:  1.  Tikosyn loading 2.  Direct current cardioversion on 04/13/17 by Dr Radford Pax which successfully restored SR.  There were no early apparent complications.   Brief HPI: Kevin Buff, [name] is a 83 y.o. male with a past medical history as noted above.  He wsa referred to the AFib clinic to arrange Tiksoyn loading, followed out patient by EP, Dr. Lovena Le.  Risks, benefits, and alternatives to Tikosyn were reviewed with the patient who wished to proceed.    Hospital Course:  The patient was admitted and Tikosyn was initiated.  Renal function and electrolytes were followed during the hospitalization. Will increase his home potassium replacement.  QTC lengthened and required down-titration,  remained stable at reduced dose.  On 04/13/17 he underwent direct current cardioversion which restored sinus rhythm.  The patient monitored until discharge on telemetry which demonstrated APace/Vsense, occ PVCs, NSVT monomorphic, in d/w Dr. Lovena Le, known prior.  On the day of discharge,he was examined by Dr. Lovena Le who considered him stable for discharge to home.  Follow-up has been arranged with the AFib clinic in 1 week and with Dr Lovena Le in 4 weeks.   Physical Exam: Vitals:   04/13/17 1424 04/13/17 2014 04/13/17  2041 04/14/17 0600  BP: (!) 144/85 132/80  (!) 150/89  Pulse: 60 61 68 66  Resp:  15  16  Temp: 97.9 F (36.6 C) 97.7 F (36.5 C)  97.7 F (36.5 C)  TempSrc: Oral Oral  Oral  SpO2: 97% 98%  98%  Weight:    235 lb (106.6 kg)    GEN- The patient is well appearing, alert and oriented x 3 today.   HEENT: normocephalic, atraumatic; sclera clear, conjunctiva pink; hearing intact; oropharynx clear; neck supple, no JVP Lymph- no cervical lymphadenopathy Lungs- CTA b/l, normal work of breathing.  No wheezes, rales, rhonchi Heart- RRR, no murmurs, rubs or gallops, PMI not laterally displaced GI- soft, non-tender, non-distended Extremities- no clubbing, cyanosis, or edema MS- no significant deformity or atrophy Skin- warm and dry, no rash or lesion Psych- euthymic mood, full affect Neuro- strength and sensation are intact   Labs:   Lab Results  Component Value Date   WBC 7.7 04/11/2017   HGB 12.4 (L) 04/11/2017   HCT 38.9 (L) 04/11/2017   MCV 89.2 04/11/2017   PLT 215 04/11/2017    Recent Labs  Lab 04/14/17 0551  NA 140  K 4.0  CL 107  CO2 23  BUN 12  CREATININE 0.86  CALCIUM 8.8*  GLUCOSE 100*     Discharge Medications:  Allergies as of 04/14/2017      Reactions   Contrast Media [iodinated Diagnostic Agents] Hives   Gadolinium Derivatives Hives   Metrizamide Hives      Medication List    TAKE these medications   apixaban 5 MG Tabs tablet  Commonly known as:  ELIQUIS Take 1 tablet (5 mg total) by mouth 2 (two) times daily.   bisacodyl 5 MG EC tablet Commonly known as:  DULCOLAX Take 5 mg by mouth every other day.   butalbital-acetaminophen-caffeine 50-325-40 MG tablet Commonly known as:  FIORICET, ESGIC Take 1 tablet by mouth 2 (two) times daily as needed (osteo-arthritis pain).   Diclofenac Sodium 1.5 % Soln Take 1 application by mouth 2 (two) times daily as needed (Knee pain).   dofetilide 250 MCG capsule Commonly known as:  TIKOSYN Take 1 capsule  (250 mcg total) by mouth 2 (two) times daily.   doxazosin 4 MG tablet Commonly known as:  CARDURA Take 4 mg by mouth at bedtime.   HYDROcodone-acetaminophen 10-325 MG tablet Commonly known as:  NORCO Take 1 tablet by mouth 2 (two) times daily as needed. What changed:  reasons to take this   LORazepam 1 MG tablet Commonly known as:  ATIVAN Take 1 mg by mouth at bedtime.   magnesium oxide 400 MG tablet Commonly known as:  MAG-OX Take 400 mg by mouth daily.   metoprolol succinate 100 MG 24 hr tablet Commonly known as:  TOPROL-XL Take 50-100 mg by mouth 2 (two) times daily. Take 100 mg by mouth in the morning and 50 mg by mouth at night. Take with or immediately following a meal.   nitroGLYCERIN 0.4 MG SL tablet Commonly known as:  NITROSTAT Place 1 tablet (0.4 mg total) under the tongue every 5 (five) minutes as needed for chest pain.   OVER THE COUNTER MEDICATION Apply 1 patch topically daily as needed (neck and shoulder pain). Chinese pain patch   potassium chloride SA 20 MEQ tablet Commonly known as:  K-DUR,KLOR-CON Take 2 tablets (40 mEq total) by mouth daily. Start taking on:  04/15/2017 What changed:  how much to take   predniSONE 10 MG tablet Commonly known as:  DELTASONE TAKE 1 TABLET BY MOUTH ONCE DAILY WITH BREAKFAST What changed:  See the new instructions.   rosuvastatin 20 MG tablet Commonly known as:  CRESTOR TAKE 1 TABLET BY MOUTH AT BEDTIME.   Vitamin D3 2000 units capsule Take 1,000 Units by mouth daily.       Disposition:  Home Discharge Instructions    Diet - low sodium heart healthy   Complete by:  As directed    Increase activity slowly   Complete by:  As directed      Follow-up Information    [facility] Follow up on 04/21/2017.   Specialty:  Cardiology Why:  1:30PM Contact information: [address] [id number] mc [place]       Ev[facility] Lance, [name] Follow up on  05/17/2017.   Specialty:  Cardiology Why:  10:45AM Contact information: [address] [address] [phone]           Duration of Discharge Encounter: Greater than 30 minutes including physician time.  Venetia Night, PA-C 04/14/2017 11:35 AM  EP Attending  Patient seen and examined. Agree with above. He is doing well after reverting to NSR after DCCV and dofetilide. His QTC is acceptable after my review. He will be discharged home and usual followup. Because his potassium has been a little low, he will take 40 meq of supplemental potassium daily.   Mikle Bosworth.D.

## 2017-04-13 NOTE — Progress Notes (Signed)
EKG obtained 3 hours post tikosyn. QTc = 531

## 2017-04-13 NOTE — Discharge Instructions (Addendum)
You have an appointment set up with the [facility].  Multiple studies have shown that being followed by a dedicated atrial fibrillation clinic in addition to the standard care you receive from your other physicians improves health. We believe that enrollment in the atrial fibrillation clinic will allow [place] to better care for you.   The phone number to the [facility] is [phone]. The clinic is staffed Monday through Friday from 8:30am to 5pm.  Parking Directions: The clinic is located in the Heart and Vascular Building connected to [facility]. 1)From [address] turn on to [address] and go to the 3rd entrance  (Heart and Vascular entrance) on the right. 2)Look to the right for Heart &Vascular Parking Garage. 3)A code for the entrance is required please call the clinic to receive this.   4)Take the elevators to the 1st floor. Registration is in the room with the glass walls at the end of the hallway.  If you have any trouble parking or locating the clinic, please dont hesitate to call [phone].   Electrical Cardioversion, Care After This sheet gives you information about how to care for yourself after your procedure. Your health care provider may also give you more specific instructions. If you have problems or questions, contact your health care provider. What can I expect after the procedure? After the procedure, it is common to have:  Some redness on the skin where the shocks were given.  Follow these instructions at home:  Do not drive for 24 hours if you were given a medicine to help you relax (sedative).  Take over-the-counter and prescription medicines only as told by your health care provider.  Ask your health care provider how to check your pulse. Check it often.  Rest for 48 hours after the procedure or as told by your health care provider.  Avoid or limit your caffeine use as told by your health care provider. Contact a  health care provider if:  You feel like your heart is beating too quickly or your pulse is not regular.  You have a serious muscle cramp that does not go away. Get help right away if:  You have discomfort in your chest.  You are dizzy or you feel faint.  You have trouble breathing or you are short of breath.  Your speech is slurred.  You have trouble moving an arm or leg on one side of your body.  Your fingers or toes turn cold or blue. This information is not intended to replace advice given to you by your health care provider. Make sure you discuss any questions you have with your health care provider. Document Released: 12/06/2012 Document Revised: 09/19/2015 Document Reviewed: 08/22/2015 Elsevier Interactive Patient Education  2018 [place] on my medicine - ELIQUIS (apixaban)  Why was Eliquis prescribed for you? Eliquis was prescribed for you to reduce the risk of a blood clot forming that can cause a stroke if you have a medical condition called atrial fibrillation (a type of irregular heartbeat).  What do You need to know about Eliquis ? Take your Eliquis TWICE DAILY - one tablet in the morning and one tablet in the evening with or without food. If you have difficulty swallowing the tablet whole please discuss with your pharmacist how to take the medication safely.  Take Eliquis exactly as prescribed by your doctor and DO NOT stop taking Eliquis without talking to the doctor who prescribed the medication.  Stopping may increase your  risk of developing a stroke.  Refill your prescription before you run out.  After discharge, you should have regular check-up appointments with your healthcare provider that is prescribing your Eliquis.  In the future your dose may need to be changed if your kidney function or weight changes by a significant amount or as you get older.  What do you do if you miss a dose? If you miss a dose, take it as soon as you remember  on the same day and resume taking twice daily.  Do not take more than one dose of ELIQUIS at the same time to make up a missed dose.  Important Safety Information A possible side effect of Eliquis is bleeding. You should call your healthcare provider right away if you experience any of the following: ? Bleeding from an injury or your nose that does not stop. ? Unusual colored urine (red or dark brown) or unusual colored stools (red or black). ? Unusual bruising for unknown reasons. ? A serious fall or if you hit your head (even if there is no bleeding).  Some medicines may interact with Eliquis and might increase your risk of bleeding or clotting while on Eliquis. To help avoid this, consult your healthcare provider or pharmacist prior to using any new prescription or non-prescription medications, including herbals, vitamins, non-steroidal anti-inflammatory drugs (NSAIDs) and supplements.  This website has more information on Eliquis (apixaban): http://www.eliquis.com/eliquis/home

## 2017-04-13 NOTE — Anesthesia Preprocedure Evaluation (Addendum)
Anesthesia Evaluation  Patient identified by MRN, date of birth, ID band Patient awake    Reviewed: Allergy & Precautions, H&P , NPO status , Patient's Chart, lab work & pertinent test results, reviewed documented beta blocker date and time   Airway Mallampati: II  TM Distance: >3 FB Neck ROM: Full    Dental no notable dental hx.    Pulmonary neg pulmonary ROS,    Pulmonary exam normal breath sounds clear to auscultation       Cardiovascular hypertension, Pt. on medications and Pt. on home beta blockers + CAD  Normal cardiovascular exam+ dysrhythmias Atrial Fibrillation + pacemaker  Rhythm:Regular Rate:Normal     Neuro/Psych CVA negative neurological ROS  negative psych ROS   GI/Hepatic negative GI ROS, Neg liver ROS,   Endo/Other  negative endocrine ROS  Renal/GU negative Renal ROS  negative genitourinary   Musculoskeletal  (+) Arthritis , Osteoarthritis,    Abdominal   Peds  Hematology negative hematology ROS (+)   Anesthesia Other Findings   Reproductive/Obstetrics negative OB ROS                            Anesthesia Physical Anesthesia Plan  ASA: III  Anesthesia Plan: General   Post-op Pain Management:    Induction: Intravenous  PONV Risk Score and Plan: 2 and Treatment may vary due to age or medical condition  Airway Management Planned: Mask  Additional Equipment:   Intra-op Plan:   Post-operative Plan:   Informed Consent: I have reviewed the patients History and Physical, chart, labs and discussed the procedure including the risks, benefits and alternatives for the proposed anesthesia with the patient or authorized representative who has indicated his/her understanding and acceptance.   Dental advisory given  Plan Discussed with: CRNA  Anesthesia Plan Comments:         Anesthesia Quick Evaluation

## 2017-04-13 NOTE — Progress Notes (Signed)
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  At this time, the patient denies chest pain, shortness of breath, or any new concerns.  Inpatient Medications    Scheduled Meds: . apixaban  5 mg Oral BID  . bisacodyl  5 mg Oral QODAY  . dofetilide  250 mcg Oral BID  . doxazosin  4 mg Oral QHS  . LORazepam  1 mg Oral QHS  . magnesium oxide  400 mg Oral Daily  . metoprolol succinate  100 mg Oral QHS   And  . metoprolol succinate  100 mg Oral Daily  . potassium chloride SA  20 mEq Oral Daily  . predniSONE  10 mg Oral Q breakfast  . rosuvastatin  20 mg Oral QHS  . sodium chloride flush  3 mL Intravenous Q12H   Continuous Infusions: . sodium chloride     PRN Meds: sodium chloride, butalbital-acetaminophen-caffeine, hydrALAZINE, HYDROcodone-acetaminophen, nitroGLYCERIN, sodium chloride flush   Vital Signs    Vitals:   04/12/17 2005 04/12/17 2311 04/12/17 2343 04/13/17 0540  BP: (!) 117/94 (!) 168/129 132/86 (!) 135/95  Pulse: 92   95  Resp: 17   17  Temp: 98.1 F (36.7 C)   97.7 F (36.5 C)  TempSrc: Oral   Oral  SpO2: 97%   97%  Weight:    230 lb (104.3 kg)    Intake/Output Summary (Last 24 hours) at 04/13/2017 0630 Last data filed at 04/12/2017 2200 Gross per 24 hour  Intake 1260 ml  Output -  Net 1260 ml   Filed Weights   04/12/17 0520 04/13/17 0540  Weight: 230 lb (104.3 kg) 230 lb (104.3 kg)    Physical Exam    GEN- The patient is well appearing, alert and oriented x 3 today.   Head- normocephalic, atraumatic Eyes-  Sclera clear, conjunctiva pink Ears- hearing intact Oropharynx- clear Neck- supple Lungs- Clear to ausculation bilaterally, normal work of breathing Heart- Irregular rate and rhythm  GI- soft, NT, ND, + BS Extremities- no clubbing, cyanosis, or edema Skin- no rash or lesion Psych- euthymic mood, full  affect Neuro- strength and sensation are intact  Labs    CBC Recent Labs    04/11/17 0917  WBC 7.7  HGB 12.4*  HCT 38.9*  MCV 89.2  PLT 099   Basic Metabolic Panel Recent Labs    04/12/17 0350 04/13/17 0418  NA 141 138  K 3.9 3.4*  CL 106 105  CO2 26 23  GLUCOSE 109* 101*  BUN 10 11  CREATININE 1.02 0.87  CALCIUM 8.9 8.8*  MG 2.1 2.3   Thyroid Function Tests Recent Labs    04/11/17 0930  TSH 3.278    Telemetry    Atypical flutter (personally reviewed)  Radiology    No results found.   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  Persistent atrial arrhythmias Admitted for Tikosyn load Plan DCCV today - NPO QTc ok when accounting for underlying RBBB K+ repleted this morning Continue [place]   2.  HTN Will re-evaluate once back in [place] today and hopefully home tomorrow if QTc ok in SR.    Signed, Chanetta Marshall, NP  04/13/2017, 6:30 AM   EP Attending  Patient seen and examined. Agree  with above. The patient remains in atrial flutter this morning. I have discussed his elevated bp and HR and plan for DCCV this morning. With RBBB, his QTC is within acceptable limits on dofetilide 250 q12. His potassium has been repleted.  Mikle Bosworth.D.

## 2017-04-13 NOTE — Transfer of Care (Signed)
Immediate Anesthesia Transfer of Care Note  Patient: Kevin Buff, MD  Procedure(s) Performed: CARDIOVERSION (N/A )  Patient Location: Endoscopy Unit  Anesthesia Type:General  Level of Consciousness: awake and alert   Airway & Oxygen Therapy: Patient Spontanous Breathing  Post-op Assessment: Report given to RN and Post -op Vital signs reviewed and stable  Post vital signs: Reviewed and stable  Last Vitals:  Vitals:   04/13/17 1238 04/13/17 1342  BP: (!) 156/115 (!) 145/76  Pulse: (!) 124 72  Resp: 15 15  Temp: 36.6 C   SpO2: 94% 96%    Last Pain:  Vitals:   04/13/17 1342  TempSrc: Oral  PainSc:       Patients Stated Pain Goal: 0 (43/83/81 8403)  Complications: No apparent anesthesia complications

## 2017-04-13 NOTE — Plan of Care (Signed)
Pt up ad lib. Ambulates independently with ease. Verbalizes understanding of need to call for assistance prior to ambulation when necessary

## 2017-04-14 ENCOUNTER — Encounter (HOSPITAL_COMMUNITY): Payer: Self-pay | Admitting: Cardiology

## 2017-04-14 LAB — BASIC METABOLIC PANEL
ANION GAP: 10 (ref 5–15)
BUN: 12 mg/dL (ref 6–20)
CALCIUM: 8.8 mg/dL — AB (ref 8.9–10.3)
CO2: 23 mmol/L (ref 22–32)
Chloride: 107 mmol/L (ref 101–111)
Creatinine, Ser: 0.86 mg/dL (ref 0.61–1.24)
GFR calc Af Amer: >60 mL/min (ref >60)
GLUCOSE: 100 mg/dL — AB (ref 65–99)
Potassium: 4 mmol/L (ref 3.5–5.1)
SODIUM: 140 mmol/L (ref 135–145)

## 2017-04-14 LAB — MAGNESIUM: MAGNESIUM: 2.2 mg/dL (ref 1.7–2.4)

## 2017-04-14 MED ORDER — DOFETILIDE 250 MCG PO CAPS: 250.0000 ug | ORAL_CAPSULE | Freq: Two times a day (BID) | ORAL | 6 refills | Status: DC

## 2017-04-14 MED ORDER — POTASSIUM CHLORIDE CRYS ER 20 MEQ PO TBCR: 40.0000 meq | EXTENDED_RELEASE_TABLET | Freq: Every day | ORAL | 6 refills | Status: DC

## 2017-04-14 MED FILL — DOFETILIDE 250 MCG CAPS: 250 | 30 days supply | Qty: 60 | Fill #0

## 2017-04-14 MED FILL — POTASSIUM CL ER 20 MEQ TABL: 20 | 30 days supply | Qty: 60 | Fill #0

## 2017-04-14 NOTE — Plan of Care (Signed)
Pt consumes 75-100% of all meals and snacks. Endorses good appetite.

## 2017-04-19 ENCOUNTER — Ambulatory Visit (HOSPITAL_COMMUNITY)
Admission: RE | Admit: 2017-04-19 | Discharge: 2017-04-19 | Disposition: A | Discharge status: Home / Self Care | Payer: Medicare Other | Source: Ambulatory Visit | Attending: Nurse Practitioner | Admitting: Nurse Practitioner

## 2017-04-19 ENCOUNTER — Encounter (HOSPITAL_COMMUNITY): Payer: Self-pay | Admitting: Nurse Practitioner

## 2017-04-19 VITALS — BP 136/82 | HR 67 | Ht 74.0 in | Wt 231.0 lb

## 2017-04-19 DIAGNOSIS — Z952 Presence of prosthetic heart valve: Secondary | ICD-10-CM | POA: Diagnosis not present

## 2017-04-19 DIAGNOSIS — Z9889 Other specified postprocedural states: Secondary | ICD-10-CM | POA: Insufficient documentation

## 2017-04-19 DIAGNOSIS — I481 Persistent atrial fibrillation: Secondary | ICD-10-CM | POA: Insufficient documentation

## 2017-04-19 DIAGNOSIS — Z8673 Personal history of transient ischemic attack (TIA), and cerebral infarction without residual deficits: Secondary | ICD-10-CM | POA: Diagnosis not present

## 2017-04-19 DIAGNOSIS — I4819 Other persistent atrial fibrillation: Secondary | ICD-10-CM

## 2017-04-19 DIAGNOSIS — Z79899 Other long term (current) drug therapy: Secondary | ICD-10-CM | POA: Diagnosis not present

## 2017-04-19 LAB — BASIC METABOLIC PANEL
Anion gap: 9 (ref 5–15)
BUN: 17 mg/dL (ref 6–20)
CALCIUM: 9.3 mg/dL (ref 8.9–10.3)
CO2: 24 mmol/L (ref 22–32)
Chloride: 106 mmol/L (ref 101–111)
Creatinine, Ser: 0.88 mg/dL (ref 0.61–1.24)
GFR calc Af Amer: >60 mL/min (ref >60)
GLUCOSE: 116 mg/dL — AB (ref 65–99)
Potassium: 4.6 mmol/L (ref 3.5–5.1)
Sodium: 139 mmol/L (ref 135–145)

## 2017-04-19 LAB — MAGNESIUM: Magnesium: 2.3 mg/dL (ref 1.7–2.4)

## 2017-04-19 NOTE — Progress Notes (Signed)
Primary Care Physician: [name], MD Referring Physician: Dr. Gladstone Lighter, MD is a 83 y.o. male with a h/o persistent Afib that is in the Afib clinic for f/u admission for Tikosyn per Dr. Lovena Le. He has h/o of remote amiodarone use.  He felt like he may have been back in afib over the weekend, but today his EKG shows atrial paced rhythm with PAC's. Interrogation of device also shows atrial paced rhythm since cardioversion. Pt states that sensitivity on  his device was tuned up in the hospital at he felt his heart rate was not responsive to activity. Now since adjustment, he feels he needs to be turned back down, because his HR is increasing too mcuh with activity. .   Today, he denies symptoms of palpitations, chest pain, shortness of breath, orthopnea, PND, lower extremity edema, dizziness, presyncope, syncope, or neurologic sequela. The patient is tolerating medications without difficulties and is otherwise without complaint today.   Past Medical History:  Diagnosis Date  . Aortic stenosis    moderate aortic stenosis  . Arthritis   . Benign prostatic hypertrophy   . 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  . Coronary artery disease    status post multiple prior percutaneous coronary interventions, microvascular angina per Dr Olevia Perches  . Heart murmur   . Hypercoagulable state ([place])    chronically anticoagulated with coumadin  . Hyperlipidemia   . Hyperthyroidism   . Hypothyroidism    Dr. Elyse Hsu  . MGUS (monoclonal gammopathy of unknown significance) 02/17/2013  . Paroxysmal atrial fibrillation ([place])    DR. Lia Foyer,   . Stroke [facility])    1990   Past Surgical History:  Procedure Laterality Date  . AORTIC VALVE REPLACEMENT  03/15/2011   Procedure: AORTIC VALVE REPLACEMENT (AVR);  Surgeon: Gaye Pollack, MD;  Location: [place];  Service: Open Heart Surgery;  Laterality: N/A;  . APPENDECTOMY    . CARDIAC CATHETERIZATION     13  . CARDIOVERSION    . CARDIOVERSION  04/15/2011   Procedure: CARDIOVERSION;  Surgeon: Loralie Champagne, MD;  Location: [place];  Service: Cardiovascular;  Laterality: N/A;  . CARDIOVERSION N/A 09/11/2014   Procedure: CARDIOVERSION;  Surgeon: Sueanne Margarita, MD;  Location: [facility] ENDOSCOPY;  Service: Cardiovascular;  Laterality: N/A;  . CARDIOVERSION N/A 06/27/2015   Procedure: CARDIOVERSION;  Surgeon: Thayer Headings, MD;  Location: [facility] ENDOSCOPY;  Service: Cardiovascular;  Laterality: N/A;  . CARDIOVERSION N/A 07/04/2015   Procedure: CARDIOVERSION;  Surgeon: Evans Lance, MD;  Location: [place];  Service: Cardiovascular;  Laterality: N/A;  . CARDIOVERSION N/A 04/13/2017   Procedure: CARDIOVERSION;  Surgeon: Sueanne Margarita, MD;  Location: [facility] ENDOSCOPY;  Service: Cardiovascular;  Laterality: N/A;  . EP IMPLANTABLE DEVICE N/A 06/16/2015   Procedure: Pacemaker Implant;  Surgeon: Evans Lance, MD;  Location: [place] CV LAB;  Service: Cardiovascular;  Laterality: N/A;  . hemrrhoidectomy    . LEFT AND RIGHT HEART CATHETERIZATION WITH CORONARY ANGIOGRAM Bilateral 02/01/2011   Procedure: LEFT AND RIGHT HEART CATHETERIZATION WITH CORONARY ANGIOGRAM;  Surgeon: Hillary Bow, MD;  Location: [facility] CATH LAB;  Service: Cardiovascular;  Laterality: Bilateral;  . MAZE  03/15/2011   Procedure: MAZE;  Surgeon: Gaye Pollack, MD;  Location: [place];  Service: Open Heart Surgery;  Laterality: N/A;  . [place]  Guidant PPM, most recent Generator Change by Dr Olevia Perches was 08/22/06  . RIGHT/LEFT HEART CATH AND CORONARY ANGIOGRAPHY N/A 07/06/2016   Procedure: Right/Left Heart Cath and Coronary Angiography;  Surgeon: Sherren Mocha, MD;  Location: [place] CV LAB;  Service: Cardiovascular;  Laterality: N/A;  . TEE WITHOUT CARDIOVERSION  04/15/2011   Procedure:  TRANSESOPHAGEAL ECHOCARDIOGRAM (TEE);  Surgeon: Loralie Champagne, MD;  Location: [place];  Service: Cardiovascular;  Laterality: N/A;  . TEE WITHOUT CARDIOVERSION N/A 09/11/2014   Procedure: TRANSESOPHAGEAL ECHOCARDIOGRAM (TEE);  Surgeon: Sueanne Margarita, MD;  Location: [facility] ENDOSCOPY;  Service: Cardiovascular;  Laterality: N/A;    Current Outpatient Medications  Medication Sig Dispense Refill  . apixaban (ELIQUIS) 5 MG TABS tablet Take 1 tablet (5 mg total) by mouth 2 (two) times daily. 180 tablet 3  . bisacodyl (DULCOLAX) 5 MG EC tablet Take 5 mg by mouth every other day.     . butalbital-acetaminophen-caffeine (FIORICET, ESGIC) 50-325-40 MG per tablet Take 1 tablet by mouth 2 (two) times daily as needed (osteo-arthritis pain).     . Cholecalciferol (VITAMIN D3) 2000 units capsule Take 1,000 Units by mouth daily.     . Diclofenac Sodium 1.5 % SOLN Take 1 application by mouth 2 (two) times daily as needed (Knee pain).     Marland Kitchen dofetilide (TIKOSYN) 250 MCG capsule Take 1 capsule (250 mcg total) by mouth 2 (two) times daily. 60 capsule 6  . doxazosin (CARDURA) 4 MG tablet Take 4 mg by mouth at bedtime.     Marland Kitchen HYDROcodone-acetaminophen (NORCO) 10-325 MG tablet Take 1 tablet by mouth 2 (two) times daily as needed. (Patient taking differently: Take 1 tablet by mouth 2 (two) times daily as needed for moderate pain. ) 100 tablet 0  . LORazepam (ATIVAN) 1 MG tablet Take 1 mg by mouth at bedtime.     . magnesium oxide (MAG-OX) 400 MG tablet Take 400 mg by mouth daily.    . metoprolol succinate (TOPROL-XL) 100 MG 24 hr tablet Take 50-100 mg by mouth 2 (two) times daily. Take 100 mg by mouth in the morning and 100 mg by mouth at night. Take with or immediately following a meal.    . nitroGLYCERIN (NITROSTAT) 0.4 MG SL tablet Place 1 tablet (0.4 mg total) under the tongue every 5 (five) minutes as needed for chest pain. 25 tablet 3  . OVER THE COUNTER MEDICATION Apply 1 patch topically daily as needed (neck and  shoulder pain). Chinese pain patch    . potassium chloride SA (K-DUR,KLOR-CON) 20 MEQ tablet Take 2 tablets (40 mEq total) by mouth daily. 60 tablet 6  . predniSONE (DELTASONE) 10 MG tablet TAKE 1 TABLET BY MOUTH ONCE DAILY WITH BREAKFAST (Patient taking differently: TAKE 5mg  BY MOUTH ONCE DAILY WITH BREAKFAST) 30 tablet 5  . rosuvastatin (CRESTOR) 20 MG tablet TAKE 1 TABLET BY MOUTH AT BEDTIME. 90 tablet 2   No current facility-administered medications for this encounter.     Allergies  Allergen Reactions  . Contrast Media [Iodinated Diagnostic Agents] Hives  . Gadolinium Derivatives Hives  . Metrizamide Hives    Social History   Socioeconomic History  . Marital status: Married    Spouse name: Not on file  . Number of children: Not on file  . Years of education: Not on file  . Highest education level: Not on file  Social Needs  . Financial resource strain: Not on file  . Food insecurity - worry: Not on file  . Food  insecurity - inability: Not on file  . Transportation needs - medical: Not on file  . Transportation needs - non-medical: Not on file  Occupational History  . Not on file  Tobacco Use  . Smoking status: Never Smoker  . Smokeless tobacco: Never Used  Substance and Sexual Activity  . Alcohol use: No    Alcohol/week: 0.0 oz  . Drug use: No  . Sexual activity: Not on file  Other Topics Concern  . Not on file  Social History Narrative   Married. Has grown children   Retired Horticulturist, commercial MD       Family History  Problem Relation Age of Onset  . Heart disease Brother        Twin brother has coronary disease and recent AVR for AS  . Anesthesia problems Neg Hx   . Hypotension Neg Hx   . Malignant hyperthermia Neg Hx   . Pseudochol deficiency Neg Hx     ROS- All systems are reviewed and negative except as per the HPI above  Physical Exam: Vitals:   04/19/17 1403  BP: 136/82  Pulse: 67  Weight: 231 lb (104.8 kg)  Height: 6\' 2"  (1.88 m)   Wt Readings  from Last 3 Encounters:  04/19/17 231 lb (104.8 kg)  04/14/17 235 lb (106.6 kg)  04/11/17 230 lb (104.3 kg)    Labs: Lab Results  Component Value Date   NA 139 04/19/2017   K 4.6 04/19/2017   CL 106 04/19/2017   CO2 24 04/19/2017   GLUCOSE 116 (H) 04/19/2017   BUN 17 04/19/2017   CREATININE 0.88 04/19/2017   CALCIUM 9.3 04/19/2017   MG 2.3 04/19/2017   Lab Results  Component Value Date   INR 1.1 07/02/2016   Lab Results  Component Value Date   CHOL 162 07/02/2016   HDL 71 07/02/2016   LDLCALC 67 07/02/2016   TRIG 120 07/02/2016     GEN- The patient is well appearing, alert and oriented x 3 today.   Head- normocephalic, atraumatic Eyes-  Sclera clear, conjunctiva pink Ears- hearing intact Oropharynx- clear Neck- supple, no JVP Lymph- no cervical lymphadenopathy Lungs- Clear to ausculation bilaterally, normal work of breathing Heart regular( with PAC's)  and rhythm, no murmurs, rubs or gallops, PMI not laterally displaced GI- soft, NT, ND, + BS Extremities- no clubbing, cyanosis, or edema MS- no significant deformity or atrophy Skin- no rash or lesion Psych- euthymic mood, full affect Neuro- strength and sensation are intact  EKG-atrial paced rhythm with prolonged AV conduction with PAC's, RBBB, LAFB, qrs int 142 ms, qtc 494 ms Epic records reviewed    Assessment and Plan: 1. Persistent  afib Appears to be atrial pacing since Tikosyn/cardioversion Joey, [employer] rep, in for interrogation and also adjusted HR sensitivity so that HR will not jump up so quickly with exertion Continue eliquis with chadsvasc score of at least 5 Bmet/mag  F/u with Dr. Lovena Le 3/19  Geroge Baseman. [place], [facility] [address] [place], [place] [address] [phone]

## 2017-04-21 ENCOUNTER — Ambulatory Visit (HOSPITAL_COMMUNITY): Payer: Medicare Other | Admitting: Nurse Practitioner

## 2017-04-29 LAB — CUP PACEART INCLINIC DEVICE CHECK
Date Time Interrogation Session: 20190301182111
Implantable Lead Location: 753859
Implantable Lead Model: 7741
Implantable Lead Serial Number: 662696
Implantable Pulse Generator Implant Date: 20170417
MDC IDC LEAD IMPLANT DT: 20170417
MDC IDC LEAD IMPLANT DT: 20170417
MDC IDC LEAD LOCATION: 753860
MDC IDC LEAD SERIAL: 751382
Pulse Gen Serial Number: 718418

## 2017-04-29 IMAGING — CT CT ABD-PELV W/O CM
2 of 4 series · 15 of 46 positions shown, 17 images · non-contrast
Comparison: CT the abdomen and pelvis 01/18/2003.

CLINICAL DATA: 81-year-old male with history of left-sided
abdominal pain. Prior history of diverticulitis.

EXAM:
CT ABDOMEN AND PELVIS WITHOUT CONTRAST
TECHNIQUE: Multidetector CT imaging of the abdomen and pelvis was performed
following the standard protocol without IV contrast.

[Series 2: abd/ pelvis · axial · 0.98mm/px · z∈[-455,-35]mm · 12 of 100 slices shown, 14 images]
[im 8/100  soft-tissue]
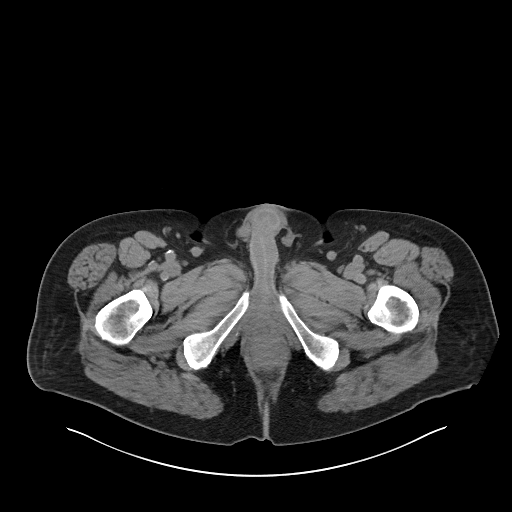
[im 8/100  bone]
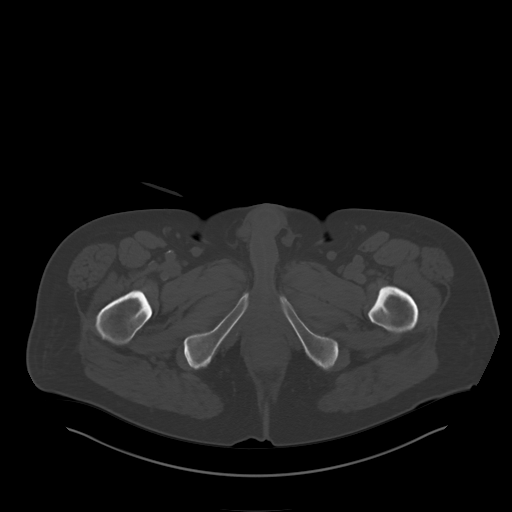
[im 16/100  soft-tissue]
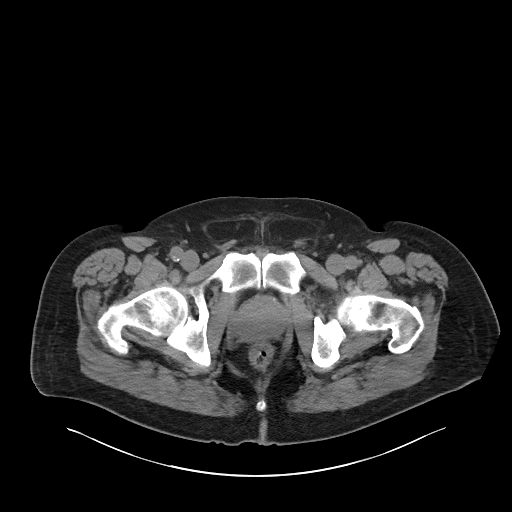
[im 23/100  soft-tissue]
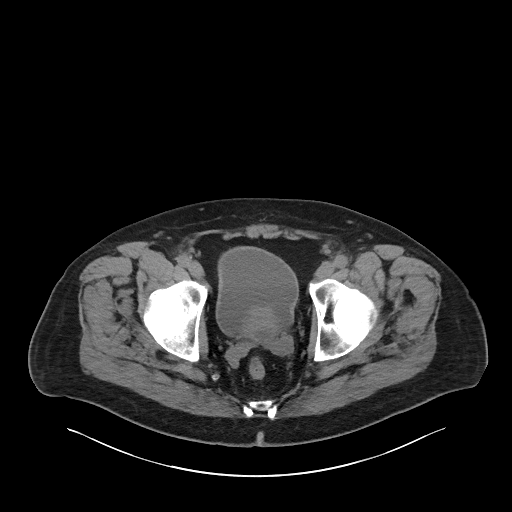
[im 31/100  soft-tissue]
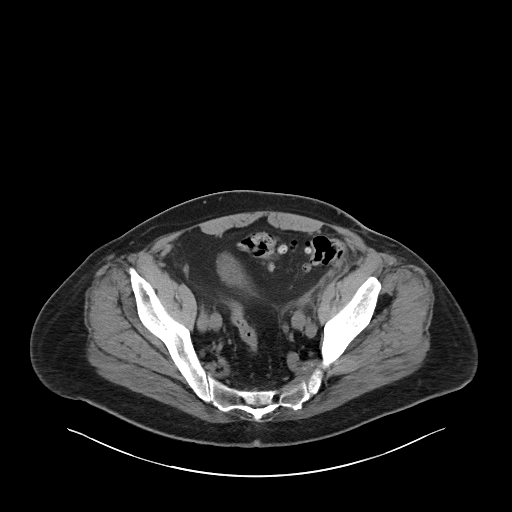
[im 39/100  soft-tissue]
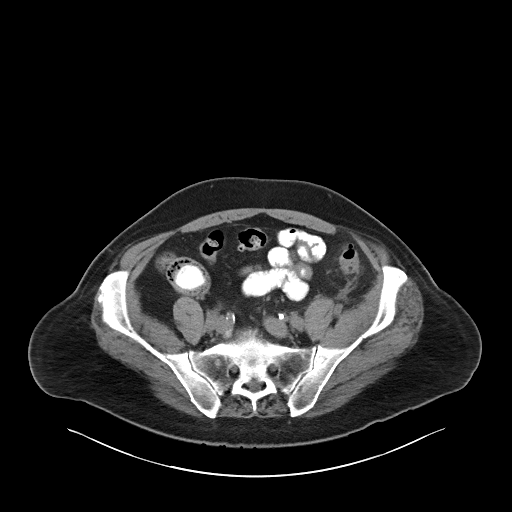
[im 46/100  soft-tissue]
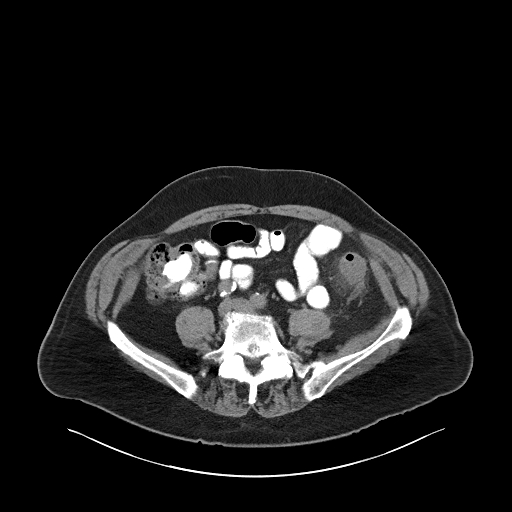
[im 54/100  soft-tissue]
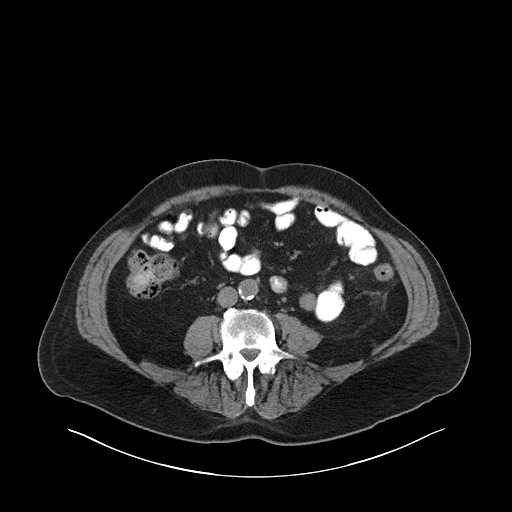
[im 61/100  soft-tissue]
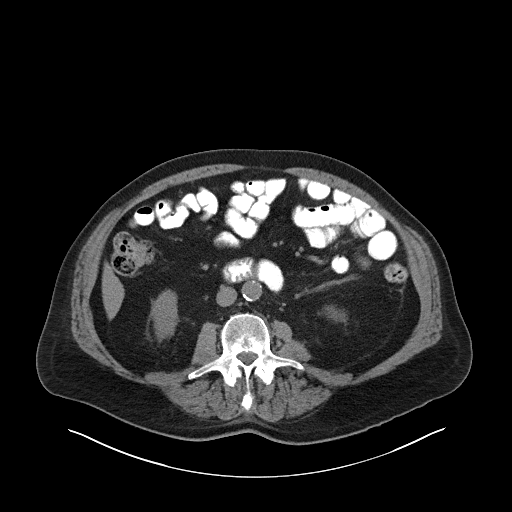
[im 69/100  soft-tissue]
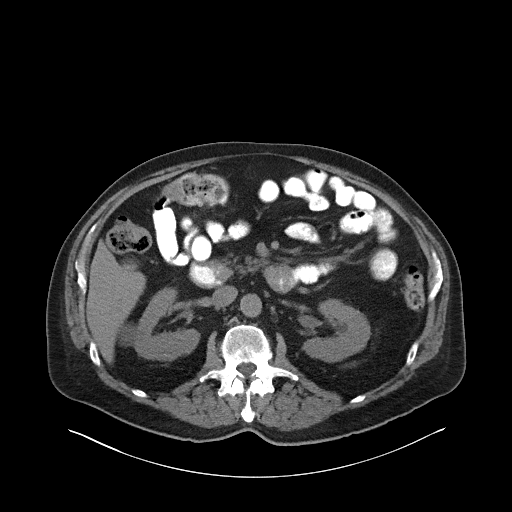
[im 69/100  bone]
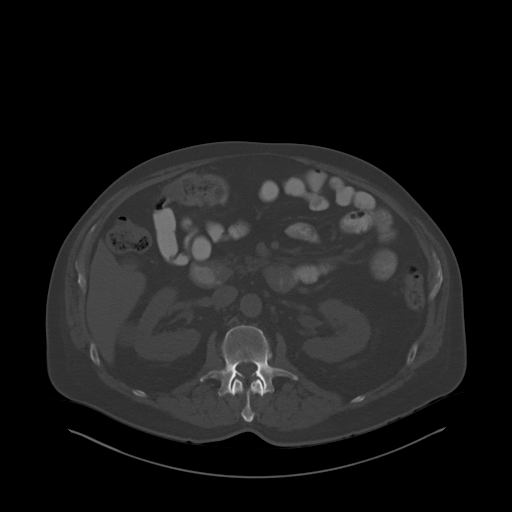
[im 77/100  soft-tissue]
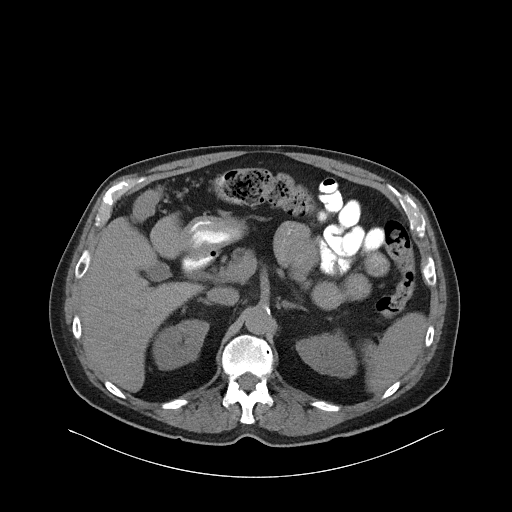
[im 84/100  soft-tissue]
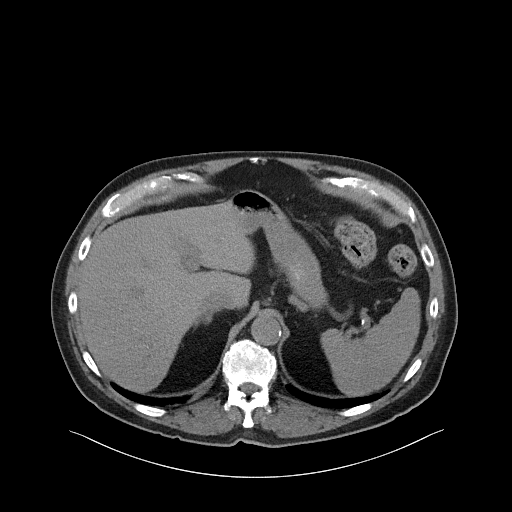
[im 92/100  soft-tissue]
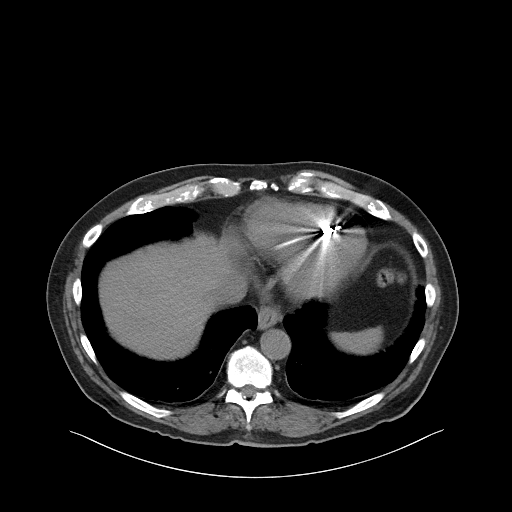

[Series 5: cor st · coronal · 0.80mm/px · 3 of 94 slices shown]
[im 32/94  soft-tissue]
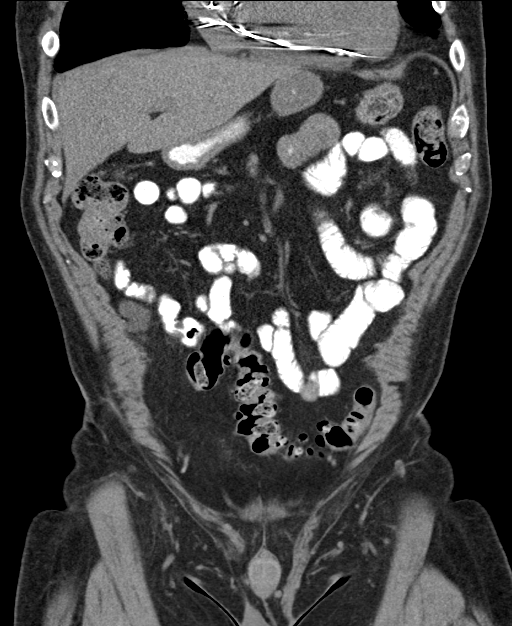
[im 42/94  soft-tissue]
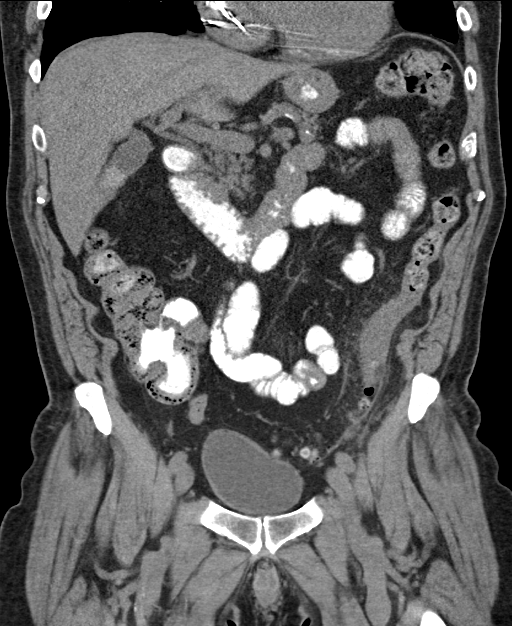
[im 52/94  soft-tissue]
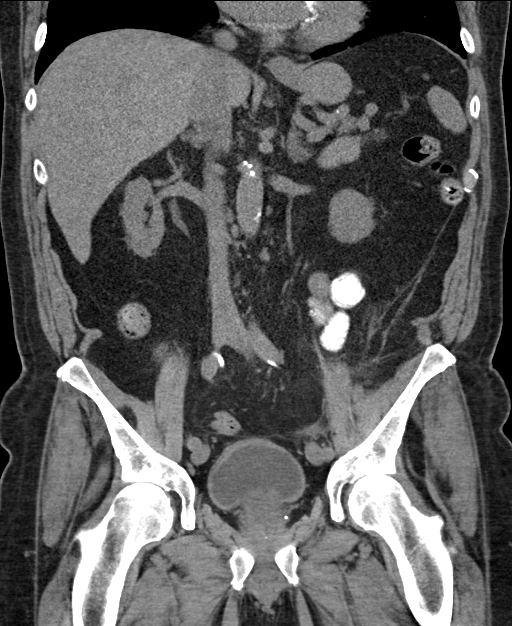

[15 of 46 positions shown; findings below may reference images not displayed]

FINDINGS: Lower chest: Mild scarring in the right lower lobe. Mild
cardiomegaly. Calcifications of the mitral annulus. Pacemaker leads
in the right atrium and right ventricle. Atherosclerotic
calcifications in the thoracic aorta and right coronary artery.

Hepatobiliary: No definite cystic or solid hepatic lesions are
identified on today's noncontrast CT examination. There is a small
amount of dependent high attenuation material which may represent
tiny gallstones or biliary sludge in the gallbladder. Gallbladder is
not distended. No pericholecystic fluid or inflammatory changes to
suggest an acute cholecystitis at this time.

Pancreas: No definite pancreatic mass or peripancreatic inflammatory
changes.

Spleen: Unremarkable.

Adrenals/Urinary Tract: Exophytic 4 cm low-attenuation lesion in the
lateral aspect of the interpolar region of the right kidney,
incompletely characterized, but only slightly larger than prior
study from 6228, presumably a cyst. Left kidney and bilateral
adrenal glands are normal in appearance. No urinary tract calculi.
No hydroureteronephrosis. Unenhanced appearance of the urinary
bladder is normal.

Stomach/Bowel: Unenhanced appearance of the stomach is normal. There
is no pathologic dilatation of small bowel or colon. Numerous
colonic diverticulae are noted, and there are inflammatory changes
in the left lower quadrant adjacent to the proximal sigmoid colon,
compatible with an acute diverticulitis. No definite diverticular
abscess or signs of frank perforation are noted at this time. The
appendix is not confidently identified and may be surgically absent.
Regardless, there are no inflammatory changes noted adjacent to the
cecum to suggest the presence of an acute appendicitis at this time.

Vascular/Lymphatic: Aortic atherosclerosis, without evidence of
aneurysm in the abdominal or pelvic vasculature. No lymphadenopathy
noted in the abdomen or pelvis.

Reproductive: Prostate gland is enlarged measuring 5.0 x 6.1 x
cm, with median lobe hypertrophy. Seminal vesicles are unremarkable
in appearance.

Other: No significant volume of ascites.  No pneumoperitoneum.

Musculoskeletal: There are no aggressive appearing lytic or blastic
lesions noted in the visualized portions of the skeleton. Median
sternotomy wires.
IMPRESSION: 1. Findings are compatible with acute diverticulitis of the proximal
sigmoid colon. No discrete diverticular abscess or definite signs to
suggest frank perforation at this time.
2. Cholelithiasis and/or biliary sludge lying dependently in the
gallbladder. No findings to suggest an acute cholecystitis at this
time.
3. Aortic atherosclerosis.
4. Prostatomegaly with median lobe hypertrophy of the prostate
gland.
5. Additional incidental findings, as above.

## 2017-05-17 ENCOUNTER — Encounter: Payer: Self-pay | Admitting: Internal Medicine

## 2017-05-17 ENCOUNTER — Ambulatory Visit (INDEPENDENT_AMBULATORY_CARE_PROVIDER_SITE_OTHER): Payer: Medicare Other | Admitting: Internal Medicine

## 2017-05-17 ENCOUNTER — Other Ambulatory Visit: Payer: Self-pay | Admitting: Rheumatology

## 2017-05-17 VITALS — BP 110/86 | HR 67 | Ht 73.5 in | Wt 220.0 lb

## 2017-05-17 DIAGNOSIS — I481 Persistent atrial fibrillation: Secondary | ICD-10-CM

## 2017-05-17 DIAGNOSIS — Z950 Presence of cardiac pacemaker: Secondary | ICD-10-CM

## 2017-05-17 DIAGNOSIS — I484 Atypical atrial flutter: Secondary | ICD-10-CM

## 2017-05-17 DIAGNOSIS — I495 Sick sinus syndrome: Secondary | ICD-10-CM | POA: Diagnosis not present

## 2017-05-17 DIAGNOSIS — I2510 Atherosclerotic heart disease of native coronary artery without angina pectoris: Secondary | ICD-10-CM

## 2017-05-17 DIAGNOSIS — I4819 Other persistent atrial fibrillation: Secondary | ICD-10-CM

## 2017-05-17 MED ORDER — HYDROCODONE-ACETAMINOPHEN 10-325 MG PO TABS: 1.0000 | ORAL_TABLET | Freq: Two times a day (BID) | ORAL | 0 refills | Status: DC | PRN

## 2017-05-17 NOTE — Telephone Encounter (Signed)
Patient requests a refill on Hydrocodone. Patient would like to pick it up by noon, if possible.

## 2017-05-17 NOTE — Telephone Encounter (Signed)
Last visit: 03/31/17 Next visit due July 2019 UDS 03/31/17 Narc Agreement: 03/31/17  Okay to refill Hydrocodone?

## 2017-05-17 NOTE — Patient Instructions (Addendum)
Medication Instructions:  Your physician recommends that you continue on your current medications as directed. Please refer to the Current Medication list given to you today.  Labwork: None ordered.  Testing/Procedures: None ordered.  Follow-Up: Your physician wants you to follow-up in: 7-8 weeks with Dr. Lovena Le.  Remote monitoring is used to monitor your Pacemaker from home. This monitoring reduces the number of office visits required to check your device to one time per year. It allows [place] to keep an eye on the functioning of your device to ensure it is working properly. You are scheduled for a device check from home on 05/26/2017. You may send your transmission at any time that day. If you have a wireless device, the transmission will be sent automatically. After your physician reviews your transmission, you will receive a postcard with your next transmission date.  Any Other Special Instructions Will Be Listed Below (If Applicable).  If you need a refill on your cardiac medications before your next appointment, please call your pharmacy.

## 2017-05-17 NOTE — Progress Notes (Signed)
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 He has sinus node dysfunction and is s/p PPM insertion. He has marked first degree AV block and RBBB. He has a h/o AS and is s/p valve replacement. The patient was hospitalized just over a month ago for initiation of dofetilide. He was cardioverted and has maintained NSR. His QT interval with my manual calculation corrects to around 470. He feels palpitations and is not sure that he feels any better. He has had episodic HTN.  Allergies  Allergen Reactions  . Contrast Media [Iodinated Diagnostic Agents] Hives  . Gadolinium Derivatives Hives  . Metrizamide Hives     Current Outpatient Medications  Medication Sig Dispense Refill  . apixaban (ELIQUIS) 5 MG TABS tablet Take 1 tablet (5 mg total) by mouth 2 (two) times daily. 180 tablet 3  . bisacodyl (DULCOLAX) 5 MG EC tablet Take 5 mg by mouth every other day.     . butalbital-acetaminophen-caffeine (FIORICET, ESGIC) 50-325-40 MG per tablet Take 1 tablet by mouth 2 (two) times daily as needed (osteo-arthritis pain).     . Cholecalciferol (VITAMIN D3) 2000 units capsule Take 1,000 Units by mouth daily.     . Diclofenac Sodium 1.5 % SOLN Take 1 application by mouth 2 (two) times daily as needed (Knee pain).     Marland Kitchen dofetilide (TIKOSYN) 250 MCG capsule Take 1 capsule (250 mcg total) by mouth 2 (two) times daily. 60 capsule 6  . doxazosin (CARDURA) 4 MG tablet Take 4 mg by mouth at bedtime.     Marland Kitchen LORazepam (ATIVAN) 1 MG tablet Take 1 mg by mouth at bedtime.     . magnesium oxide (MAG-OX) 400 MG tablet Take 400 mg by mouth daily.    . metoprolol succinate (TOPROL-XL) 100 MG 24 hr tablet Take 50-100 mg by mouth 2 (two) times daily. Take 100 mg by mouth in the morning and 100 mg by mouth at night. Take with or immediately following a meal.    .  nitroGLYCERIN (NITROSTAT) 0.4 MG SL tablet Place 1 tablet (0.4 mg total) under the tongue every 5 (five) minutes as needed for chest pain. 25 tablet 3  . OVER THE COUNTER MEDICATION Apply 1 patch topically daily as needed (neck and shoulder pain). Chinese pain patch    . potassium chloride SA (K-DUR,KLOR-CON) 20 MEQ tablet Take 2 tablets (40 mEq total) by mouth daily. 60 tablet 6  . predniSONE (DELTASONE) 10 MG tablet TAKE 1 TABLET BY MOUTH ONCE DAILY WITH BREAKFAST (Patient taking differently: TAKE 5mg  BY MOUTH ONCE DAILY WITH BREAKFAST) 30 tablet 5  . rosuvastatin (CRESTOR) 20 MG tablet TAKE 1 TABLET BY MOUTH AT BEDTIME. 90 tablet 2  . HYDROcodone-acetaminophen (NORCO) 10-325 MG tablet Take 1 tablet by mouth 2 (two) times daily as needed for moderate pain. 100 tablet 0   No current facility-administered medications for this visit.      Past Medical History:  Diagnosis Date  . Aortic stenosis    moderate aortic stenosis  . Arthritis   . Benign prostatic hypertrophy   . 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  . Coronary artery disease    status post multiple prior percutaneous coronary  interventions, microvascular angina per Dr Olevia Perches  . Heart murmur   . Hypercoagulable state ([place])    chronically anticoagulated with coumadin  . Hyperlipidemia   . Hyperthyroidism   . Hypothyroidism    Dr. Elyse Hsu  . MGUS (monoclonal gammopathy of unknown significance) 02/17/2013  . Paroxysmal atrial fibrillation ([place])    DR. Lia Foyer,   . Stroke ([place])    1990    ROS:   All systems reviewed and negative except as noted in the HPI.   Past Surgical History:  Procedure Laterality Date  . AORTIC VALVE REPLACEMENT  03/15/2011   Procedure: AORTIC VALVE REPLACEMENT (AVR);  Surgeon: Gaye Pollack, MD;  Location: [place];  Service: Open Heart  Surgery;  Laterality: N/A;  . APPENDECTOMY    . CARDIAC CATHETERIZATION     13  . CARDIOVERSION    . CARDIOVERSION  04/15/2011   Procedure: CARDIOVERSION;  Surgeon: Loralie Champagne, MD;  Location: [place];  Service: Cardiovascular;  Laterality: N/A;  . CARDIOVERSION N/A 09/11/2014   Procedure: CARDIOVERSION;  Surgeon: Sueanne Margarita, MD;  Location: [facility] ENDOSCOPY;  Service: Cardiovascular;  Laterality: N/A;  . CARDIOVERSION N/A 06/27/2015   Procedure: CARDIOVERSION;  Surgeon: Thayer Headings, MD;  Location: [facility] ENDOSCOPY;  Service: Cardiovascular;  Laterality: N/A;  . CARDIOVERSION N/A 07/04/2015   Procedure: CARDIOVERSION;  Surgeon: Evans Lance, MD;  Location: [place];  Service: Cardiovascular;  Laterality: N/A;  . CARDIOVERSION N/A 04/13/2017   Procedure: CARDIOVERSION;  Surgeon: Sueanne Margarita, MD;  Location: [facility] ENDOSCOPY;  Service: Cardiovascular;  Laterality: N/A;  . EP IMPLANTABLE DEVICE N/A 06/16/2015   Procedure: Pacemaker Implant;  Surgeon: Evans Lance, MD;  Location: [place] CV LAB;  Service: Cardiovascular;  Laterality: N/A;  . hemrrhoidectomy    . LEFT AND RIGHT HEART CATHETERIZATION WITH CORONARY ANGIOGRAM Bilateral 02/01/2011   Procedure: LEFT AND RIGHT HEART CATHETERIZATION WITH CORONARY ANGIOGRAM;  Surgeon: Hillary Bow, MD;  Location: [facility] CATH LAB;  Service: Cardiovascular;  Laterality: Bilateral;  . MAZE  03/15/2011   Procedure: MAZE;  Surgeon: Gaye Pollack, MD;  Location: [place];  Service: Open Heart Surgery;  Laterality: N/A;  . PACEMAKER INSERTION  1991   Guidant PPM, most recent Generator Change by Dr Olevia Perches was 08/22/06  . RIGHT/LEFT HEART CATH AND CORONARY ANGIOGRAPHY N/A 07/06/2016   Procedure: Right/Left Heart Cath and Coronary Angiography;  Surgeon: Sherren Mocha, MD;  Location: [place] CV LAB;  Service: Cardiovascular;  Laterality: N/A;  . TEE WITHOUT CARDIOVERSION  04/15/2011   Procedure: TRANSESOPHAGEAL ECHOCARDIOGRAM (TEE);  Surgeon: Loralie Champagne, MD;  Location: [place];  Service: Cardiovascular;  Laterality: N/A;  . TEE WITHOUT CARDIOVERSION N/A 09/11/2014   Procedure: TRANSESOPHAGEAL ECHOCARDIOGRAM (TEE);  Surgeon: Sueanne Margarita, MD;  Location: [facility] ENDOSCOPY;  Service: Cardiovascular;  Laterality: N/A;     Family History  Problem Relation Age of Onset  . Heart disease Brother        Twin brother has coronary disease and recent AVR for AS  . Anesthesia problems Neg Hx   . Hypotension Neg Hx   . Malignant hyperthermia Neg Hx   . Pseudochol deficiency Neg Hx      Social History   Socioeconomic History  . Marital status: Married    Spouse name: Not on file  . Number of children: Not on file  . Years of education: Not on file  . Highest education level: Not on file  Social Needs  . Emergency planning/management officer  strain: Not on file  . Food insecurity - worry: Not on file  . Food insecurity - inability: Not on file  . Transportation needs - medical: Not on file  . Transportation needs - non-medical: Not on file  Occupational History  . Not on file  Tobacco Use  . Smoking status: Never Smoker  . Smokeless tobacco: Never Used  Substance and Sexual Activity  . Alcohol use: No    Alcohol/week: 0.0 oz  . Drug use: No  . Sexual activity: Not on file  Other Topics Concern  . Not on file  Social History Narrative   Married. Has grown children   Retired Horticulturist, commercial MD        BP 110/86   Pulse 67   Ht 6' 1.5" (1.867 m)   Wt 220 lb (99.8 kg)   BMI 28.63 kg/m   Physical Exam:  Well appearing 83 yo man, NAD HEENT: Unremarkable Neck:  6 cm JVD, no thyromegally Lymphatics:  No adenopathy Back:  No CVA tenderness Lungs:  Clear with no wheezes HEART:  Regular rate rhythm, no murmurs, no rubs, no clicks Abd:  soft, positive bowel sounds, no organomegally, no rebound, no guarding Ext:  2 plus pulses, no edema, no cyanosis, no clubbing Skin:  No rashes no nodules Neuro:  CN II through XII intact, motor grossly  intact  EKG - NSR with PAC's and RBBB  DEVICE  Normal device function.  See PaceArt for details.   Assess/Plan: 1. Persistent atrial fib/flutter - he has maintained NSR since undergoing DCCV over a month ago. I have noted that he does not feel much improved. He thought that he had gone in and out of rhythm but his PPM interogation does not support any atrial fib. I have recommended that he either stop dofetilide or continue for another month or two. If he does not clearly feel better when I see him back then we will stop dofetilide.  2. Sinus node dysfunction - his resting HR is in the low 30's. He is asymptomatic with atrial pacing. 3. CAD - he denies anginal symptoms. Will follow. 4. PPM - interogation of his PPM demonstrates that his [employer] device is working normally.  Mikle Bosworth.D.

## 2017-05-18 LAB — CUP PACEART INCLINIC DEVICE CHECK
Implantable Lead Implant Date: 20170417
Implantable Lead Location: 753859
Implantable Lead Location: 753860
Implantable Lead Model: 7740
Implantable Lead Model: 7741
Implantable Lead Serial Number: 662696
Implantable Lead Serial Number: 751382
Lead Channel Impedance Value: 693 Ohm
Lead Channel Impedance Value: 785 Ohm
Lead Channel Pacing Threshold Amplitude: 0.5 V
Lead Channel Pacing Threshold Pulse Width: 0.4 ms
Lead Channel Setting Pacing Amplitude: 2.5 V
Lead Channel Setting Pacing Pulse Width: 0.4 ms
Lead Channel Setting Sensing Sensitivity: 2.5 mV
MDC IDC LEAD IMPLANT DT: 20170417
MDC IDC MSMT LEADCHNL RA PACING THRESHOLD AMPLITUDE: 0.8 V
MDC IDC MSMT LEADCHNL RA PACING THRESHOLD PULSEWIDTH: 0.5 ms
MDC IDC PG IMPLANT DT: 20170417
MDC IDC SESS DTM: 20190319040000
MDC IDC SET LEADCHNL RV PACING AMPLITUDE: 2.5 V
Pulse Gen Serial Number: 718418

## 2017-05-19 ENCOUNTER — Other Ambulatory Visit: Payer: Self-pay | Admitting: Rheumatology

## 2017-05-19 ENCOUNTER — Other Ambulatory Visit: Payer: Self-pay | Admitting: *Deleted

## 2017-05-19 MED ORDER — APIXABAN 5 MG PO TABS: 5.0000 mg | ORAL_TABLET | Freq: Two times a day (BID) | ORAL | 1 refills | Status: DC

## 2017-05-19 MED FILL — DOXAZOSIN MESYLATE 4 MG TAB: 4 | 90 days supply | Qty: 90 | Fill #0

## 2017-05-19 MED FILL — DOFETILIDE 250 MCG CAPS: 250 | 30 days supply | Qty: 60 | Fill #1

## 2017-05-19 MED FILL — ROSUVASTATIN CALCIUM 20 MG: 20 | 90 days supply | Qty: 90 | Fill #2 | Status: TO

## 2017-05-19 MED FILL — predniSONE 10 MG TABS: 10 | 30 days supply | Qty: 30 | Fill #3

## 2017-05-19 MED FILL — POTASSIUM CL ER 20 MEQ TABL: 20 | 30 days supply | Qty: 60 | Fill #1

## 2017-05-19 MED FILL — ELIQUIS 5 MG TABLET: 5 | 90 days supply | Qty: 180 | Fill #0

## 2017-05-25 MED FILL — HYDROCODON-APAP 10-325: 10-325 | 50 days supply | Qty: 100 | Fill #0

## 2017-05-26 ENCOUNTER — Encounter: Payer: Medicare Other | Admitting: *Deleted

## 2017-05-27 ENCOUNTER — Encounter: Payer: Self-pay | Admitting: Cardiology

## 2017-06-01 ENCOUNTER — Telehealth: Payer: Self-pay | Admitting: Neurology

## 2017-06-01 NOTE — Telephone Encounter (Signed)
Talked with patel about appt

## 2017-06-02 ENCOUNTER — Encounter: Payer: Self-pay | Admitting: Neurology

## 2017-06-02 ENCOUNTER — Ambulatory Visit (INDEPENDENT_AMBULATORY_CARE_PROVIDER_SITE_OTHER): Payer: Medicare Other | Admitting: Neurology

## 2017-06-02 VITALS — BP 120/84 | HR 81 | Ht 74.0 in | Wt 220.0 lb

## 2017-06-02 DIAGNOSIS — G629 Polyneuropathy, unspecified: Secondary | ICD-10-CM

## 2017-06-02 DIAGNOSIS — I2510 Atherosclerotic heart disease of native coronary artery without angina pectoris: Secondary | ICD-10-CM | POA: Diagnosis not present

## 2017-06-02 DIAGNOSIS — G7000 Myasthenia gravis without (acute) exacerbation: Secondary | ICD-10-CM | POA: Diagnosis not present

## 2017-06-02 DIAGNOSIS — R292 Abnormal reflex: Secondary | ICD-10-CM | POA: Diagnosis not present

## 2017-06-02 DIAGNOSIS — M542 Cervicalgia: Secondary | ICD-10-CM | POA: Diagnosis not present

## 2017-06-02 NOTE — Progress Notes (Signed)
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  Medical comorbidities include hypertension, amiodarone-induced hypothyroidism and pulmonary fibrosis, cerebellar stroke (1989, age 89), sinus node dysfunction s/p PPM, CAD s/p multiple interventions, PAF on chronic anticoagulation with Eliquis, and is s/p AVR for aortic stenosis with MAZE.  History of present illness: He has history of fluctuating and fatigable bilateral ptosis, greater on the right, and binocular diplopia since 2010.  Acetylcholine receptor antibodies were checked 2014 and negative.  Because of high clinical suspicion for myasthenia, he was referred to [name] for SFEMG which showed jitter in the left orbicularis oculi.  The diagnosis of seronegative myasthenia gravis was made and he was started on mestinon 30-60mg  TID, however, admits that he did not take this very long.  CT chest was negative for thymoma.  Over the years, he has simply learned to compensate with his vertical diplopia and ptosis and remained off any medications. There has been no change in his ocular symptoms since onset.  He does not have difficulty with day-to-day activities, including driving.   In 2017, he began having increased dyspnea with exertion and greater bilateral leg fatigue and achy pain, which led to him thinking that his myasthenia may be getting worse.  He was able to walk a greater distance, but now gets very short of breath and needs to rest.  With this said, he also acknowledges his significant cardiac comorbidities and is unsure whether his MG is getting worse or if his symptoms are stemming from cardiac and pulmonary disease.  He had mild dysphagia and had barium swallow which showed slowed peristalsis.  He was offered a trial of prednisone 20mg  and  self tapered it to 10mg  and did not appreciate any change.  He is mostly bothered by his cardiac arrhythmias, especially when he reverts back into atrial flutter with tachycardia because it causes chest tightness and angina.  Because this can be triggered by a number of things, he has been less active as to avoid increasing his heart rate and does feel deconditioned.  He also has significant arthritis involving his neck, shoulder, hands, hips, and knees for which he sees [name].  UPDATE 01/12/2017:   [name] is here with new complaints of neck pain and stiffness.  He has long history of OA and is followed by [name].  He is cautious with NSAIDs due to being on Eliquis and treats pain with various analgesic patches and hydrocodone. He is mostly bothered by neck stiffness and pain with walking and feels that using a neck collar to support his head may alleviate some pain.  He denies any radicular pain into the arms or paresthesias. He has been taking fioricet twice daily for many years for tension headache and neck pain.  He continues to have double vision secondary to ocular myasthenia gravis and did not benefit with prednisone or mestinon in the past.    UPDATE 06/02/2017:  [name] is here with ongoing neck pain and stiffness and has new complaints of right hand numbness.  He denies any radicular arm pain.  There is no weakness and he does not wake up with his hand asleep.  He does not have similar symptoms on the left hand.  Over the last few months, he has also started to  have increased numbness and pain of the legs, always triggered by exertion and improved by rest. He has chronic low back pain which is exacerbated by prolonged walking.  He does not have shooting pain into his thigh or legs.  He has some degree of constant numbness of the feet.  No balance issues or falls.  He walks unassisted. In February, he was admitted for Tikosyn loading for atrial fibrillation.    He has  self-adjusted his prednisone to 3mg  daily and does not appreciate any change in his double vision.  He keeps the right eye closed most of the time, which resolves his double vision.  He continues to struggle with osteoarthritis and is followed by [name].  He takes hydrocodone with minimal relief.     Medications:  Current Outpatient Medications on File Prior to Visit  Medication Sig Dispense Refill  . apixaban (ELIQUIS) 5 MG TABS tablet Take 1 tablet (5 mg total) by mouth 2 (two) times daily. 180 tablet 1  . bisacodyl (DULCOLAX) 5 MG EC tablet Take 5 mg by mouth every other day.     . butalbital-acetaminophen-caffeine (FIORICET, ESGIC) 50-325-40 MG per tablet Take 1 tablet by mouth 2 (two) times daily as needed (osteo-arthritis pain).     . Cholecalciferol (VITAMIN D3) 2000 units capsule Take 1,000 Units by mouth daily.     . Diclofenac Sodium 1.5 % SOLN Take 1 application by mouth 2 (two) times daily as needed (Knee pain).     Marland Kitchen dofetilide (TIKOSYN) 250 MCG capsule Take 1 capsule (250 mcg total) by mouth 2 (two) times daily. 60 capsule 6  . doxazosin (CARDURA) 4 MG tablet Take 4 mg by mouth at bedtime.     Marland Kitchen HYDROcodone-acetaminophen (NORCO) 10-325 MG tablet Take 1 tablet by mouth 2 (two) times daily as needed for moderate pain. 100 tablet 0  . LORazepam (ATIVAN) 1 MG tablet Take 1 mg by mouth at bedtime.     . magnesium oxide (MAG-OX) 400 MG tablet Take 400 mg by mouth daily.    . metoprolol succinate (TOPROL-XL) 100 MG 24 hr tablet Take 50-100 mg by mouth 2 (two) times daily. Take 100 mg by mouth in the morning and 100 mg by mouth at night. Take with or immediately following a meal.    . nitroGLYCERIN (NITROSTAT) 0.4 MG SL tablet Place 1 tablet (0.4 mg total) under the tongue every 5 (five) minutes as needed for chest pain. 25 tablet 3  . OVER THE COUNTER MEDICATION Apply 1 patch topically daily as needed (neck and shoulder pain). Chinese pain patch    . potassium chloride SA  (K-DUR,KLOR-CON) 20 MEQ tablet Take 2 tablets (40 mEq total) by mouth daily. 60 tablet 6  . predniSONE (DELTASONE) 10 MG tablet TAKE 1 TABLET BY MOUTH ONCE DAILY WITH BREAKFAST (Patient taking differently: TAKE 5mg  BY MOUTH ONCE DAILY WITH BREAKFAST) 30 tablet 5  . rosuvastatin (CRESTOR) 20 MG tablet TAKE 1 TABLET BY MOUTH AT BEDTIME. 90 tablet 2   No current facility-administered medications on file prior to visit.     Allergies:  Allergies  Allergen Reactions  . Contrast Media [Iodinated Diagnostic Agents] Hives  . Gadolinium Derivatives Hives  . Metrizamide Hives    Review of Systems:  CONSTITUTIONAL: No fevers, chills, night sweats, or weight loss.  EYES: No visual changes or eye pain ENT: No hearing changes.  No history of nose bleeds.   RESPIRATORY: No cough, wheezing and shortness of breath.   CARDIOVASCULAR:  Negative for chest pain, and palpitations.   GI: Negative for abdominal discomfort, blood in stools or black stools.  No recent change in bowel habits.   GU:  No history of incontinence.   MUSCLOSKELETAL: +history of joint pain or swelling.  No myalgias.   SKIN: Negative for lesions, rash, and itching.   ENDOCRINE: Negative for cold or heat intolerance, polydipsia or goiter.   PSYCH:  No depression or anxiety symptoms.   NEURO: As Above.   Vital Signs:  BP 120/84   Pulse 81   Ht 6\' 2"  (1.88 m)   Wt 220 lb (99.8 kg)   SpO2 94%   BMI 28.25 kg/m   Neurological Exam: MENTAL STATUS including orientation to time, place, person, recent and remote memory, attention span and concentration and fund of knowledge is excellent.  Speech is not dysarthric.  CRANIAL NERVES:  Pupils are round and reactive.  Extraocular muscles are intact except for mild left upgaze restriction.  Subtle right ptosis.  Facial muscles are intact.   MOTOR:  Motor strength is 5/5 in all extremities, including neck flexion and distally in the feet.  Feet are warm without skin lesions. Dorsalis pedis  is strong bilaterally, posterior tib is weaker.   REFLEXES:  Reflexes are 3+/4 in the upper extremities. Reflexes are absent in the legs  SENSORY:  Vibration is intact at the knees, 20% at the ankles, and absent at the ankle bilaterally. Proprioception, temperature, and pin prick is intact. Tinel's sign is negative at the wrist and elbow.   COORDINATION/GAIT:  Gait narrow based and stable, stooped posture.   Data: CTA head 05/17/2012: Old left inferior cerebellar infarction. No abnormality of the cerebral hemispheres. Atherosclerosis of both carotid siphon regions without evidence of flow-limiting stenosis. Narrowing is no greater than about 25-30% in that region. The right vertebral artery is patent to the basilar. There is mild atherosclerotic narrowing of the distal 1 cm without evidence of a critical stenosis. The left vertebral artery is a tiny vessel. This could be antegrade flow, reconstituted flow or retrograde flow. See above for full discussion. There is no evidence of a dominant or correctable posterior circulation stenosis.   IMPRESSION/PLAN: Cervical spinal canal and/or foraminal stenosis is suspected.  With his ongoing cervicalgia and neck stiffness and new complaints of right hand paresthesias, he most likely has some degree of degenerative changes of the spine for which CT cervical spine will be ordered.  May consider NCS/EMG of the right hand going forward  He has not appreciated any benefit with tizanidine.  He did not start neck physiotherapy, but will reconsider this based on his CT results.  Bilateral feet paresthesias is suggestive of peripheral neuropathy.  He describes constant numbness of the feet and exam shows markedly reduced vibratory sense distally as well as arreflexia in the legs.  Neurogenic claudication is less likely, as I would expect brisk reflexes and severe low back pain.  He will be seeing [name] tomorrow for evaluation and if there is no strong  inclination for arterial claudication, I will order NCS/EMG of the legs.  Check vitamin B12, TSH.    Seronegative ocular myasthenia gravis with exacerbation. Diagnosed by SFEMG at [facility] by [name] in 2014.  No improvement with mestinon or prednisone 20mg  and he is prefers to manage this by covering one eye.  Clinically unchanged.   Greater than 50% of this 40 minute visit was spent in counseling, explanation of diagnosis, planning of further management, and coordination  of care.   Thank you for allowing me to participate in patient's care.  If I can answer any additional questions, I would be pleased to do so.    Sincerely,    Kevin Woodbury K. Posey Pronto, DO

## 2017-06-02 NOTE — Patient Instructions (Signed)
CT cervical spine without contrast

## 2017-06-03 ENCOUNTER — Ambulatory Visit (INDEPENDENT_AMBULATORY_CARE_PROVIDER_SITE_OTHER): Payer: Medicare Other | Admitting: Cardiovascular Disease

## 2017-06-03 ENCOUNTER — Other Ambulatory Visit: Payer: Self-pay | Admitting: *Deleted

## 2017-06-03 VITALS — BP 114/66 | HR 64 | Ht 74.0 in | Wt 221.0 lb

## 2017-06-03 DIAGNOSIS — I359 Nonrheumatic aortic valve disorder, unspecified: Secondary | ICD-10-CM

## 2017-06-03 DIAGNOSIS — R5383 Other fatigue: Secondary | ICD-10-CM

## 2017-06-03 DIAGNOSIS — I5032 Chronic diastolic (congestive) heart failure: Secondary | ICD-10-CM

## 2017-06-03 DIAGNOSIS — G7000 Myasthenia gravis without (acute) exacerbation: Secondary | ICD-10-CM

## 2017-06-03 NOTE — Progress Notes (Signed)
Orders in epic. 

## 2017-06-03 NOTE — Patient Instructions (Signed)
Medication Instructions:  Your provider recommends that you continue on your current medications as directed. Please refer to the Current Medication list given to you today.    Labwork: None  Testing/Procedures: Your provider has requested that you have an echocardiogram. Echocardiography is a painless test that uses sound waves to create images of your heart. It provides your doctor with information about the size and shape of your heart and how well your heart's chambers and valves are working. This procedure takes approximately one hour. There are no restrictions for this procedure.  Follow-Up: Your provider wants you to follow-up in: 6 months with Dr. Cooper. You will receive a reminder letter in the mail two months in advance. If you don't receive a letter, please call our office to schedule the follow-up appointment.    Any Other Special Instructions Will Be Listed Below (If Applicable).     If you need a refill on your cardiac medications before your next appointment, please call your pharmacy.   

## 2017-06-03 NOTE — Progress Notes (Signed)
Cardiology Office Note Date:  06/03/2017   ID:  Kevin Buff, [name], DOB 1934/06/23, MRN [id number]  PCP:  [name], [name]  Cardiologist:  Sherren Mocha, [name]    No chief complaint on file.    History of Present Illness: Kevin Buff, [name] is a 83 y.o. male who presents for evaluation of shortness of breath. He has been followed for CAD, aortic valve disease, orthostatic hypotension, persistent atrial fibrillation, and chronic diastolic heart failure.   He complains of worsening shortness of breath. Occurs with bending over or walking short distances, climbing stairs. No cough, orthopnea, or PND. No chest pain.   He is having numbness in his legs when he walks. His legs 'feel tight.' He does complain of hip pain when he walks. Mild leg swelling in the ankles. He's now on Tikosyn and maintaining sinus rhythm but he really doesn't feel any better.   Past Medical History:  Diagnosis Date  . Aortic stenosis    moderate aortic stenosis  . Arthritis   . Benign prostatic hypertrophy   . 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  . Coronary artery disease    status post multiple prior percutaneous coronary interventions, microvascular angina per Dr Olevia Perches  . Heart murmur   . Hypercoagulable state ([place])    chronically anticoagulated with coumadin  . Hyperlipidemia   . Hyperthyroidism   . Hypothyroidism    Dr. Elyse Hsu  . MGUS (monoclonal gammopathy of unknown significance) 02/17/2013  . Paroxysmal atrial fibrillation ([place])    DR. Lia Foyer,   . Stroke [facility])    1990    Past Surgical History:  Procedure Laterality Date  . AORTIC VALVE REPLACEMENT  03/15/2011   Procedure: AORTIC VALVE REPLACEMENT (AVR);  Surgeon: Gaye Pollack, [name];  Location: [place];  Service: Open Heart Surgery;  Laterality: N/A;  . APPENDECTOMY    .  CARDIAC CATHETERIZATION     13  . CARDIOVERSION    . CARDIOVERSION  04/15/2011   Procedure: CARDIOVERSION;  Surgeon: Loralie Champagne, [name];  Location: [place];  Service: Cardiovascular;  Laterality: N/A;  . CARDIOVERSION N/A 09/11/2014   Procedure: CARDIOVERSION;  Surgeon: Sueanne Margarita, [name];  Location[facility] ENDOSCOPY;  Service: Cardiovascular;  Laterality: N/A;  . CARDIOVERSION N/A 06/27/2015   Procedure: CARDIOVERSION;  Surgeon: Thayer Headings, [name];  Location: [facility] ENDOSCOPY;  Service: Cardiovascular;  Laterality: N/A;  . CARDIOVERSION N/A 07/04/2015   Procedure: CARDIOVERSION;  Surgeon: Evans Lance, [name];  Location: [place];  Service: Cardiovascular;  Laterality: N/A;  . CARDIOVERSION N/A 04/13/2017   Procedure: CARDIOVERSION;  Surgeon: Sueanne Margarita, [name];  Location: [facility] ENDOSCOPY;  Service: Cardiovascular;  Laterality: N/A;  . EP IMPLANTABLE DEVICE N/A 06/16/2015   Procedure: Pacemaker Implant;  Surgeon: Evans Lance, [name];  Location: [place] CV LAB;  Service: Cardiovascular;  Laterality: N/A;  . hemrrhoidectomy    . LEFT AND RIGHT HEART CATHETERIZATION WITH CORONARY ANGIOGRAM Bilateral 02/01/2011   Procedure: LEFT AND RIGHT HEART CATHETERIZATION WITH CORONARY ANGIOGRAM;  Surgeon: Hillary Bow, [name];  Location: [facility] CATH LAB;  Service: Cardiovascular;  Laterality: Bilateral;  . MAZE  03/15/2011   Procedure: MAZE;  Surgeon: Gaye Pollack, [name];  Location: [place];  Service: Open Heart Surgery;  Laterality: N/A;  . PACEMAKER INSERTION  1991   Guidant PPM, most recent Generator Change by Dr  Olevia Perches was 08/22/06  . RIGHT/LEFT HEART CATH AND CORONARY ANGIOGRAPHY N/A 07/06/2016   Procedure: Right/Left Heart Cath and Coronary Angiography;  Surgeon: Sherren Mocha, [name];  Location: [place] CV LAB;  Service: Cardiovascular;  Laterality: N/A;  . TEE WITHOUT CARDIOVERSION  04/15/2011   Procedure: TRANSESOPHAGEAL ECHOCARDIOGRAM (TEE);  Surgeon: Loralie Champagne, [name];  Location: [place];  Service:  Cardiovascular;  Laterality: N/A;  . TEE WITHOUT CARDIOVERSION N/A 09/11/2014   Procedure: TRANSESOPHAGEAL ECHOCARDIOGRAM (TEE);  Surgeon: Sueanne Margarita, [name];  Location: [facility] ENDOSCOPY;  Service: Cardiovascular;  Laterality: N/A;    Current Outpatient Medications  Medication Sig Dispense Refill  . apixaban (ELIQUIS) 5 MG TABS tablet Take 1 tablet (5 mg total) by mouth 2 (two) times daily. 180 tablet 1  . bisacodyl (DULCOLAX) 5 MG EC tablet Take 5 mg by mouth every other day.     . butalbital-acetaminophen-caffeine (FIORICET, ESGIC) 50-325-40 MG per tablet Take 1 tablet by mouth 2 (two) times daily as needed (osteo-arthritis pain).     . Cholecalciferol (VITAMIN D3) 2000 units capsule Take 1,000 Units by mouth daily.     . Diclofenac Sodium 1.5 % SOLN Take 1 application by mouth 2 (two) times daily as needed (Knee pain).     Marland Kitchen dofetilide (TIKOSYN) 250 MCG capsule Take 1 capsule (250 mcg total) by mouth 2 (two) times daily. 60 capsule 6  . doxazosin (CARDURA) 4 MG tablet Take 4 mg by mouth at bedtime.     Marland Kitchen HYDROcodone-acetaminophen (NORCO) 10-325 MG tablet Take 1 tablet by mouth 2 (two) times daily as needed for moderate pain. 100 tablet 0  . LORazepam (ATIVAN) 1 MG tablet Take 1 mg by mouth at bedtime.     . magnesium oxide (MAG-OX) 400 MG tablet Take 400 mg by mouth daily.    . metoprolol succinate (TOPROL-XL) 100 MG 24 hr tablet Take 50-100 mg by mouth 2 (two) times daily. Take 100 mg by mouth in the morning and 100 mg by mouth at night. Take with or immediately following a meal.    . nitroGLYCERIN (NITROSTAT) 0.4 MG SL tablet Place 1 tablet (0.4 mg total) under the tongue every 5 (five) minutes as needed for chest pain. 25 tablet 3  . OVER THE COUNTER MEDICATION Apply 1 patch topically daily as needed (neck and shoulder pain). Chinese pain patch    . potassium chloride SA (K-DUR,KLOR-CON) 20 MEQ tablet Take 2 tablets (40 mEq total) by mouth daily. 60 tablet 6  . predniSONE (DELTASONE) 10 MG  tablet TAKE 1 TABLET BY MOUTH ONCE DAILY WITH BREAKFAST (Patient taking differently: TAKE 5mg  BY MOUTH ONCE DAILY WITH BREAKFAST) 30 tablet 5  . rosuvastatin (CRESTOR) 20 MG tablet TAKE 1 TABLET BY MOUTH AT BEDTIME. 90 tablet 2   No current facility-administered medications for this visit.     Allergies:   Contrast media [iodinated diagnostic agents]; Gadolinium derivatives; and Metrizamide   Social History:  The patient  reports that he has never smoked. He has never used smokeless tobacco. He reports that he does not drink alcohol or use drugs.   Family History:  The patient's family history includes Heart disease in his brother.    ROS:  Please see the history of present illness.  Otherwise, review of systems is positive for dizziness, arthritis.  All other systems are reviewed and negative.    PHYSICAL EXAM: VS:  BP 114/66   Pulse 64   Ht 6\' 2"  (1.88 m)   Wt 221 lb (  100.2 kg)   BMI 28.37 kg/m  , BMI Body mass index is 28.37 kg/m. GEN: Well nourished, well developed, in no acute distress  HEENT: normal  Neck: no JVD, no masses. No carotid bruits Cardiac: RRR with 2/6 SEM at the RUSB                Respiratory:  clear to auscultation bilaterally, normal work of breathing GI: soft, nontender, nondistended, + BS MS: no deformity or atrophy  Ext: trace bilateral ankle edema, pedal pulses 2+= bilaterally Skin: warm and dry, no rash Neuro:  Strength and sensation are intact Psych: euthymic mood, full affect  EKG:  EKG is not ordered today.  Recent Labs: 11/19/2016: ALT 17 04/11/2017: Hemoglobin 12.4; Platelets 215; TSH 3.278 04/19/2017: BUN 17; Creatinine, Ser 0.88; Magnesium 2.3; Potassium 4.6; Sodium 139   Lipid Panel     Component Value Date/Time   CHOL 162 07/02/2016 1126   TRIG 120 07/02/2016 1126   HDL 71 07/02/2016 1126   CHOLHDL 2.3 07/02/2016 1126   CHOLHDL 3.4 09/10/2014 1530   VLDL 23 09/10/2014 1530   LDLCALC 67 07/02/2016 1126   LDLDIRECT 117.9 11/16/2010  1355      Wt Readings from Last 3 Encounters:  06/03/17 221 lb (100.2 kg)  06/02/17 220 lb (99.8 kg)  05/17/17 220 lb (99.8 kg)     Cardiac Studies Reviewed: Echo 08-11-2015: Study Conclusions  - Left ventricle: The cavity size was mildly dilated. Wall   thickness was normal. Systolic function was normal. The estimated   ejection fraction was in the range of 55% to 60%. Wall motion was   normal; there were no regional wall motion abnormalities. The   study is not technically sufficient to allow evaluation of LV   diastolic function. - Aortic valve: A bioprosthesis was present and functioning   normally. Mean gradient (S): 12 mm Hg. Peak gradient (S): 27 mm   Hg. Valve area (VTI): 1.48 cm^2. Valve area (Vmax): 1.44 cm^2.   Valve area (Vmean): 1.65 cm^2. - Mitral valve: Calcified annulus. There was mild regurgitation   directed centrally. - Left atrium: The atrium was severely dilated. - Right ventricle: The cavity size was mildly dilated. - Right atrium: The atrium was moderately dilated. - Pulmonary arteries: Systolic pressure was mildly increased. PA   peak pressure: 41 mm Hg (S).   ASSESSMENT AND PLAN: 1.  Chronic diastolic CHF: worsening shortness of breath. No clinical exam signs of volume overload. Cath last year showed normal right heart hemodynamics. Will check echo to assess prosthetic aortic valve function, LV/RV function. No medication changes.   2. CAD, native vessel: no angina. Most recent cath study result reviewed.   3. Persistent atrial fibrillation: tolerating oral anticoagulation without bleeding problems. Maintaining sinus rhythm on Tikosyn. Followed by Dr Lovena Le.  4. Aortic valve disease: exam unchanged. Check echo.   Current medicines are reviewed with the patient today.  The patient does not have concerns regarding medicines.  Labs/ tests ordered today include:  No orders of the defined types were placed in this encounter.   Disposition:   FU 6  months  Signed, Sherren Mocha, [name]  06/03/2017 12:26 PM    [address], [place], [place]  [address] Phone: [phone]; Fax: [phone]

## 2017-06-05 ENCOUNTER — Encounter: Payer: Self-pay | Admitting: Cardiovascular Disease

## 2017-06-06 ENCOUNTER — Ambulatory Visit (HOSPITAL_COMMUNITY): Payer: Medicare Other | Attending: Cardiology

## 2017-06-06 ENCOUNTER — Other Ambulatory Visit: Payer: Self-pay

## 2017-06-06 DIAGNOSIS — I5032 Chronic diastolic (congestive) heart failure: Secondary | ICD-10-CM | POA: Insufficient documentation

## 2017-06-06 DIAGNOSIS — I4892 Unspecified atrial flutter: Secondary | ICD-10-CM | POA: Diagnosis not present

## 2017-06-06 DIAGNOSIS — I272 Pulmonary hypertension, unspecified: Secondary | ICD-10-CM | POA: Insufficient documentation

## 2017-06-06 DIAGNOSIS — I2510 Atherosclerotic heart disease of native coronary artery without angina pectoris: Secondary | ICD-10-CM | POA: Insufficient documentation

## 2017-06-06 DIAGNOSIS — Z8673 Personal history of transient ischemic attack (TIA), and cerebral infarction without residual deficits: Secondary | ICD-10-CM | POA: Insufficient documentation

## 2017-06-06 DIAGNOSIS — Z953 Presence of xenogenic heart valve: Secondary | ICD-10-CM | POA: Insufficient documentation

## 2017-06-06 DIAGNOSIS — I081 Rheumatic disorders of both mitral and tricuspid valves: Secondary | ICD-10-CM | POA: Insufficient documentation

## 2017-06-06 DIAGNOSIS — Z950 Presence of cardiac pacemaker: Secondary | ICD-10-CM | POA: Diagnosis not present

## 2017-06-06 DIAGNOSIS — J90 Pleural effusion, not elsewhere classified: Secondary | ICD-10-CM | POA: Diagnosis not present

## 2017-06-06 DIAGNOSIS — I4891 Unspecified atrial fibrillation: Secondary | ICD-10-CM | POA: Insufficient documentation

## 2017-06-06 MED FILL — LORazepam 1 MG TABS: 1 | 60 days supply | Qty: 60 | Fill #1

## 2017-06-10 ENCOUNTER — Ambulatory Visit
Admission: RE | Admit: 2017-06-10 | Discharge: 2017-06-10 | Disposition: A | Discharge status: Home / Self Care | Payer: Medicare Other | Source: Ambulatory Visit | Attending: Neurology | Admitting: Neurology

## 2017-06-10 DIAGNOSIS — M542 Cervicalgia: Secondary | ICD-10-CM

## 2017-06-10 DIAGNOSIS — R292 Abnormal reflex: Secondary | ICD-10-CM

## 2017-06-10 DIAGNOSIS — M50323 Other cervical disc degeneration at C6-C7 level: Secondary | ICD-10-CM | POA: Diagnosis not present

## 2017-06-12 MED FILL — DOFETILIDE 250 MCG CAPS: 250 | 30 days supply | Qty: 60 | Fill #2

## 2017-06-13 ENCOUNTER — Telehealth: Payer: Self-pay | Admitting: Neurology

## 2017-06-13 NOTE — Telephone Encounter (Signed)
Called Dr. Velora Heckler to discuss results of his CT cervical spine which shows degenerative cervical spondylosis with multilevel disc and facet changes, worse at C3-4 on the right.  There is mild right foraminal stenosis at C5-6, minimal at C6-7 and C7-8 which would not explain his right hand paresthesias.  Therefore, I suspect that there may be an entrapment neuropathy such as CTS in the right hand.  NCS/EMG offered, and would like to hold off on this unless paresthesias get worse. In the meantime, recommend using a wrist splint at bedtime to see if this helps.  Draycen Leichter K. Posey Pronto, DO

## 2017-06-24 MED FILL — predniSONE 10 MG TABS: 10 | 30 days supply | Qty: 30 | Fill #4

## 2017-07-07 MED FILL — METOPROLOL SUCCINATE ER 100: 100 | 90 days supply | Qty: 180 | Fill #2

## 2017-07-07 MED FILL — POTASSIUM CL ER 20 MEQ TABL: 20 | 30 days supply | Qty: 60 | Fill #2

## 2017-07-11 ENCOUNTER — Other Ambulatory Visit: Payer: Self-pay | Admitting: Rheumatology

## 2017-07-11 NOTE — Telephone Encounter (Signed)
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  He has been having increased discomfort in his bilateral shoulders and C-spine.  He was requesting a shoulder injection.  I have advised him to contact his cardiologist and if he gets permission from his cardiologist I will inject his shoulder joint with cortisone.  I also advised him to come in for UDS when he comes to pick up the hydrocodone prescription.  It is okay to refill hydrocodone.

## 2017-07-11 NOTE — Telephone Encounter (Signed)
Patient called requesting prescription refill of Hydrocodone.  Patient's pharmacy is [facility].  Patient also stated that he would like to talk to Dr. Estanislado Pandy directly when she has some time.

## 2017-07-11 NOTE — Telephone Encounter (Signed)
Last visit: 03/31/17 Next visit due July 2019 UDS 03/31/17 Narc Agreement: 03/31/17  Okay to refill Hydrocodone?  Patient also stated that he would like to talk to Dr. Estanislado Pandy directly when she has some time.

## 2017-07-12 ENCOUNTER — Other Ambulatory Visit: Payer: Self-pay

## 2017-07-12 ENCOUNTER — Other Ambulatory Visit: Payer: Self-pay | Admitting: *Deleted

## 2017-07-12 DIAGNOSIS — Z5181 Encounter for therapeutic drug level monitoring: Secondary | ICD-10-CM

## 2017-07-12 MED ORDER — HYDROCODONE-ACETAMINOPHEN 10-325 MG PO TABS: 1.0000 | ORAL_TABLET | Freq: Two times a day (BID) | ORAL | 0 refills | Status: DC | PRN

## 2017-07-12 MED FILL — HYDROCODON-APAP 10-325: 10-325 | 50 days supply | Qty: 100 | Fill #0

## 2017-07-13 DIAGNOSIS — L821 Other seborrheic keratosis: Secondary | ICD-10-CM | POA: Diagnosis not present

## 2017-07-13 DIAGNOSIS — L570 Actinic keratosis: Secondary | ICD-10-CM | POA: Diagnosis not present

## 2017-07-13 DIAGNOSIS — Z85828 Personal history of other malignant neoplasm of skin: Secondary | ICD-10-CM | POA: Diagnosis not present

## 2017-07-13 MED FILL — FLUOROURACIL 5% CREAM: 5 | 14 days supply | Qty: 40 | Fill #0

## 2017-07-17 LAB — PAIN MGMT, PROFILE 5 W/CONF, U
ALPHAHYDROXYALPRAZOLAM: NEGATIVE ng/mL (ref <25)
AMOBARBITAL: NEGATIVE ng/mL (ref <100)
AMPHETAMINES: NEGATIVE ng/mL (ref <500)
Alphahydroxymidazolam: NEGATIVE ng/mL (ref <50)
Alphahydroxytriazolam: NEGATIVE ng/mL (ref <50)
Aminoclonazepam: NEGATIVE ng/mL (ref <25)
BUTALBITAL: 1860 ng/mL — AB (ref <100)
Barbiturates: POSITIVE ng/mL — AB (ref <300)
Benzodiazepines: POSITIVE ng/mL — AB (ref <100)
Cocaine Metabolite: NEGATIVE ng/mL (ref <150)
Codeine: NEGATIVE ng/mL (ref <50)
Creatinine: 90.8 mg/dL
HYDROXYETHYLFLURAZEPAM: NEGATIVE ng/mL (ref <50)
Hydrocodone: 225 ng/mL — ABNORMAL HIGH (ref <50)
Hydromorphone: 335 ng/mL — ABNORMAL HIGH (ref <50)
LORAZEPAM: 486 ng/mL — AB (ref <50)
MARIJUANA METABOLITE: NEGATIVE ng/mL (ref <20)
MORPHINE: NEGATIVE ng/mL (ref <50)
Methadone Metabolite: NEGATIVE ng/mL (ref <100)
NORHYDROCODONE: 1020 ng/mL — AB (ref <50)
Nordiazepam: NEGATIVE ng/mL (ref <50)
Opiates: POSITIVE ng/mL — AB (ref <100)
Oxazepam: NEGATIVE ng/mL (ref <50)
Oxidant: NEGATIVE ug/mL (ref <200)
Oxycodone: NEGATIVE ng/mL (ref <100)
PENTOBARBITAL: NEGATIVE ng/mL (ref <100)
Phenobarbital: NEGATIVE ng/mL (ref <100)
SECOBARBITAL: NEGATIVE ng/mL (ref <100)
Temazepam: NEGATIVE ng/mL (ref <50)
pH: 6.72 (ref 4.5–9.0)

## 2017-07-18 ENCOUNTER — Ambulatory Visit (INDEPENDENT_AMBULATORY_CARE_PROVIDER_SITE_OTHER): Payer: Medicare Other | Admitting: Internal Medicine

## 2017-07-18 ENCOUNTER — Encounter: Payer: Self-pay | Admitting: Internal Medicine

## 2017-07-18 VITALS — BP 140/90 | HR 69 | Ht 73.0 in | Wt 225.0 lb

## 2017-07-18 DIAGNOSIS — I481 Persistent atrial fibrillation: Secondary | ICD-10-CM

## 2017-07-18 DIAGNOSIS — I4819 Other persistent atrial fibrillation: Secondary | ICD-10-CM

## 2017-07-18 DIAGNOSIS — I2510 Atherosclerotic heart disease of native coronary artery without angina pectoris: Secondary | ICD-10-CM

## 2017-07-18 DIAGNOSIS — I495 Sick sinus syndrome: Secondary | ICD-10-CM

## 2017-07-18 DIAGNOSIS — I484 Atypical atrial flutter: Secondary | ICD-10-CM

## 2017-07-18 DIAGNOSIS — Z950 Presence of cardiac pacemaker: Secondary | ICD-10-CM | POA: Diagnosis not present

## 2017-07-18 LAB — CUP PACEART INCLINIC DEVICE CHECK
Brady Statistic RV Percent Paced: 36 %
Date Time Interrogation Session: 20190520040000
Implantable Lead Implant Date: 20170417
Implantable Lead Location: 753860
Implantable Lead Model: 7741
Implantable Lead Serial Number: 662696
Implantable Lead Serial Number: 751382
Lead Channel Impedance Value: 739 Ohm
Lead Channel Impedance Value: 786 Ohm
Lead Channel Pacing Threshold Amplitude: 0.8 V
Lead Channel Pacing Threshold Pulse Width: 0.5 ms
Lead Channel Sensing Intrinsic Amplitude: 3.7 mV
MDC IDC LEAD IMPLANT DT: 20170417
MDC IDC LEAD LOCATION: 753859
MDC IDC MSMT LEADCHNL RV PACING THRESHOLD AMPLITUDE: 0.5 V
MDC IDC MSMT LEADCHNL RV PACING THRESHOLD PULSEWIDTH: 0.4 ms
MDC IDC MSMT LEADCHNL RV SENSING INTR AMPL: 14.9 mV
MDC IDC PG IMPLANT DT: 20170417
MDC IDC SET LEADCHNL RA PACING AMPLITUDE: 2.5 V
MDC IDC SET LEADCHNL RV PACING AMPLITUDE: 2.5 V
MDC IDC SET LEADCHNL RV PACING PULSEWIDTH: 0.4 ms
MDC IDC SET LEADCHNL RV SENSING SENSITIVITY: 2.5 mV
MDC IDC STAT BRADY RA PERCENT PACED: 21 %
Pulse Gen Serial Number: 718418

## 2017-07-18 NOTE — Progress Notes (Signed)
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 He is a pleasant 83 yo man with a h/o persistent atrial fib, AS, CAD, S./P AVR, who was placed on dofetilide and maintained NSR but developed recurrent atypical atrial flutter and presents for additional evaluation. The patient has felt like he went back out of rhyth about 3 weeks ago. I saw him 2 months ago and he was maintaining NSR though he was not sure if he felt much better. His QT interval on dofetilide was borderline. No syncope. Allergies  Allergen Reactions  . Contrast Media [Iodinated Diagnostic Agents] Hives  . Gadolinium Derivatives Hives  . Metrizamide Hives     Current Outpatient Medications  Medication Sig Dispense Refill  . apixaban (ELIQUIS) 5 MG TABS tablet Take 1 tablet (5 mg total) by mouth 2 (two) times daily. 180 tablet 1  . bisacodyl (DULCOLAX) 5 MG EC tablet Take 5 mg by mouth every other day.     . butalbital-acetaminophen-caffeine (FIORICET, ESGIC) 50-325-40 MG per tablet Take 1 tablet by mouth 2 (two) times daily as needed (osteo-arthritis pain).     . Cholecalciferol (VITAMIN D3) 2000 units capsule Take 1,000 Units by mouth daily.     . Diclofenac Sodium 1.5 % SOLN Take 1 application by mouth 2 (two) times daily as needed (Knee pain).     Marland Kitchen doxazosin (CARDURA) 4 MG tablet Take 4 mg by mouth at bedtime.     Marland Kitchen HYDROcodone-acetaminophen (NORCO) 10-325 MG tablet Take 1 tablet by mouth 2 (two) times daily as needed for moderate pain. 100 tablet 0  . LORazepam (ATIVAN) 1 MG tablet Take 1 mg by mouth at bedtime.     . magnesium oxide (MAG-OX) 400 MG tablet Take 400 mg by mouth daily.    . metoprolol succinate (TOPROL-XL) 100 MG 24 hr tablet Take 50-100 mg by mouth 2 (two) times daily. Take 100 mg by mouth in the morning and 100 mg by mouth at night. Take with or immediately following a meal.    . nitroGLYCERIN (NITROSTAT) 0.4 MG SL tablet Place 1 tablet (0.4 mg total) under the tongue every 5 (five) minutes as needed for  chest pain. 25 tablet 3  . OVER THE COUNTER MEDICATION Apply 1 patch topically daily as needed (neck and shoulder pain). Chinese pain patch    . potassium chloride SA (K-DUR,KLOR-CON) 20 MEQ tablet Take 2 tablets (40 mEq total) by mouth daily. 60 tablet 6  . predniSONE (DELTASONE) 10 MG tablet TAKE 1 TABLET BY MOUTH ONCE DAILY WITH BREAKFAST (Patient taking differently: TAKE 5mg  BY MOUTH ONCE DAILY WITH BREAKFAST) 30 tablet 5  . rosuvastatin (CRESTOR) 20 MG tablet TAKE 1 TABLET BY MOUTH AT BEDTIME. 90 tablet 2   No current facility-administered medications for this visit.      Past Medical History:  Diagnosis Date  . Aortic stenosis    moderate aortic stenosis  . Arthritis   . Benign prostatic hypertrophy   . 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  . Coronary artery disease    status post multiple prior percutaneous coronary interventions, microvascular angina per Dr Olevia Perches  . Heart murmur   . Hypercoagulable state ([place])    chronically anticoagulated with coumadin  . Hyperlipidemia   . Hyperthyroidism   . Hypothyroidism    Dr. Elyse Hsu  . MGUS (monoclonal gammopathy of unknown  significance) 02/17/2013  . Paroxysmal atrial fibrillation ([place])    DR. Lia Foyer,   . Stroke ([place])    1990    ROS:   All systems reviewed and negative except as noted in the HPI.   Past Surgical History:  Procedure Laterality Date  . AORTIC VALVE REPLACEMENT  03/15/2011   Procedure: AORTIC VALVE REPLACEMENT (AVR);  Surgeon: Gaye Pollack, MD;  Location: [place];  Service: Open Heart Surgery;  Laterality: N/A;  . APPENDECTOMY    . CARDIAC CATHETERIZATION     11  . CARDIOVERSION    . CARDIOVERSION  04/15/2011   Procedure: CARDIOVERSION;  Surgeon: Loralie Champagne, MD;  Location: [place];  Service: Cardiovascular;  Laterality: N/A;  .  CARDIOVERSION N/A 09/11/2014   Procedure: CARDIOVERSION;  Surgeon: Sueanne Margarita, MD;  Location: [facility] ENDOSCOPY;  Service: Cardiovascular;  Laterality: N/A;  . CARDIOVERSION N/A 06/27/2015   Procedure: CARDIOVERSION;  Surgeon: Thayer Headings, MD;  Location: [facility] ENDOSCOPY;  Service: Cardiovascular;  Laterality: N/A;  . CARDIOVERSION N/A 07/04/2015   Procedure: CARDIOVERSION;  Surgeon: Evans Lance, MD;  Location: [place];  Service: Cardiovascular;  Laterality: N/A;  . CARDIOVERSION N/A 04/13/2017   Procedure: CARDIOVERSION;  Surgeon: Sueanne Margarita, MD;  Location: [facility] ENDOSCOPY;  Service: Cardiovascular;  Laterality: N/A;  . EP IMPLANTABLE DEVICE N/A 06/16/2015   Procedure: Pacemaker Implant;  Surgeon: Evans Lance, MD;  Location: [place] CV LAB;  Service: Cardiovascular;  Laterality: N/A;  . hemrrhoidectomy    . LEFT AND RIGHT HEART CATHETERIZATION WITH CORONARY ANGIOGRAM Bilateral 02/01/2011   Procedure: LEFT AND RIGHT HEART CATHETERIZATION WITH CORONARY ANGIOGRAM;  Surgeon: Hillary Bow, MD;  Location: [facility] CATH LAB;  Service: Cardiovascular;  Laterality: Bilateral;  . MAZE  03/15/2011   Procedure: MAZE;  Surgeon: Gaye Pollack, MD;  Location: [place];  Service: Open Heart Surgery;  Laterality: N/A;  . PACEMAKER INSERTION  1991   Guidant PPM, most recent Generator Change by Dr Olevia Perches was 08/22/06  . RIGHT/LEFT HEART CATH AND CORONARY ANGIOGRAPHY N/A 07/06/2016   Procedure: Right/Left Heart Cath and Coronary Angiography;  Surgeon: Sherren Mocha, MD;  Location: [place] CV LAB;  Service: Cardiovascular;  Laterality: N/A;  . TEE WITHOUT CARDIOVERSION  04/15/2011   Procedure: TRANSESOPHAGEAL ECHOCARDIOGRAM (TEE);  Surgeon: Loralie Champagne, MD;  Location: [place];  Service: Cardiovascular;  Laterality: N/A;  . TEE WITHOUT CARDIOVERSION N/A 09/11/2014   Procedure: TRANSESOPHAGEAL ECHOCARDIOGRAM (TEE);  Surgeon: Sueanne Margarita, MD;  Location: [facility] ENDOSCOPY;  Service: Cardiovascular;   Laterality: N/A;     Family History  Problem Relation Age of Onset  . Heart disease Brother        Twin brother has coronary disease and recent AVR for AS  . Anesthesia problems Neg Hx   . Hypotension Neg Hx   . Malignant hyperthermia Neg Hx   . Pseudochol deficiency Neg Hx      Social History   Socioeconomic History  . Marital status: Married    Spouse name: Not on file  . Number of children: Not on file  . Years of education: Not on file  . Highest education level: Not on file  Occupational History  . Not on file  Social Needs  . Financial resource strain: Not on file  . Food insecurity:    Worry: Not on file    Inability: Not on file  . Transportation needs:    Medical: Not on file    Non-medical: Not  on file  Tobacco Use  . Smoking status: Never Smoker  . Smokeless tobacco: Never Used  Substance and Sexual Activity  . Alcohol use: No    Alcohol/week: 0.0 oz  . Drug use: No  . Sexual activity: Not on file  Lifestyle  . Physical activity:    Days per week: Not on file    Minutes per session: Not on file  . Stress: Not on file  Relationships  . Social connections:    Talks on phone: Not on file    Gets together: Not on file    Attends religious service: Not on file    Active member of club or organization: Not on file    Attends meetings of clubs or organizations: Not on file    Relationship status: Not on file  . Intimate partner violence:    Fear of current or ex partner: Not on file    Emotionally abused: Not on file    Physically abused: Not on file    Forced sexual activity: Not on file  Other Topics Concern  . Not on file  Social History Narrative   Married. Has grown children   Retired Horticulturist, commercial MD        BP 140/90   Pulse 69   Ht 6\' 1"  (1.854 m)   Wt 225 lb (102.1 kg)   SpO2 95%   BMI 29.69 kg/m   Physical Exam:  Well appearing 83 yo man, NAD HEENT: Unremarkable Neck:  6 cm JVD, no thyromegally Lymphatics:  No adenopathy Back:   No CVA tenderness Lungs:  Clear with no wheezes HEART:  IRegular rate rhythm, no murmurs, no rubs, no clicks Abd:  soft, positive bowel sounds, no organomegally, no rebound, no guarding Ext:  2 plus pulses, no edema, no cyanosis, no clubbing Skin:  No rashes no nodules Neuro:  CN II through XII intact, motor grossly intact  EKG  -atrial flutter with a CVR  DEVICE  Normal device function.  See PaceArt for details.   Assess/Plan: 1. PAF - he has reverted back to atrial fib/flutter and I tried to pace him back to NSR. I was unsuccessful. I discussed the feasibility of repeat DCCV as well as stopping the dofetilide and allowing him to remain rate controlled. He chooses the latter and I agree.  2. Chronic diastolic heart failure - his symptoms are class 2 mostly.  3. PPM - his [employer] DDD PM is working normally. Will follow. 4. HTN - his blood pressure is up a bit today. At times he does this and it is not clear what the cause is. He has been worked up in the past. I asked him to maintain a low sodium diet.  Mikle Bosworth.D.

## 2017-07-18 NOTE — Patient Instructions (Addendum)
Medication Instructions:  Your physician has recommended you make the following change in your medication: 1.   Stop dofetilide  Labwork: None ordered.  Testing/Procedures: None ordered.  Follow-Up: Your physician wants you to follow-up in: 6 months with Dr. Lovena Le.   You will receive a reminder letter in the mail two months in advance. If you don't receive a letter, please call our office to schedule the follow-up appointment.  Remote monitoring is used to monitor your Pacemaker from home. This monitoring reduces the number of office visits required to check your device to one time per year. It allows [place] to keep an eye on the functioning of your device to ensure it is working properly. You are scheduled for a device check from home on 10/17/2017. You may send your transmission at any time that day. If you have a wireless device, the transmission will be sent automatically. After your physician reviews your transmission, you will receive a postcard with your next transmission date.  Any Other Special Instructions Will Be Listed Below (If Applicable).  If you need a refill on your cardiac medications before your next appointment, please call your pharmacy.

## 2017-07-21 NOTE — Progress Notes (Signed)
Office Visit Note  Patient: Kevin Buff, [name]             Date of Birth: November 12, 1934           MRN: [id number]             PCP: [name], [name] Referring: [place], Legrand Como, [name] Visit Date: 07/26/2017 Occupation: @GUAROCC @    Subjective:  Pain in both shoulders.   History of Present Illness: Kevin Buff, [name] is a 83 y.o. male with history of osteoarthritis and DDD.  He continues to have pain and discomfort in his bilateral shoulders and want to have cortisone injections to his shoulder.  He was seen by his cardiologist recently and got approval for the cortisone injections.  He states he continues to have discomfort in the posterior shoulders during day and nighttime.  Activities of Daily Living:  Patient reports morning stiffness for 24 hours.   Patient Reports nocturnal pain.  Difficulty dressing/grooming: Reports Difficulty climbing stairs: Reports Difficulty getting out of chair: Reports Difficulty using hands for taps, buttons, cutlery, and/or writing: Reports   Review of Systems  Constitutional: Positive for activity change and fatigue.  HENT: Positive for mouth dryness.   Eyes: Negative for dryness.  Respiratory: Positive for shortness of breath and difficulty breathing.   Cardiovascular: Positive for swelling in legs/feet.  Gastrointestinal: Negative for constipation.  Endocrine: Positive for excessive thirst and increased urination.  Genitourinary: Positive for difficulty urinating.  Musculoskeletal: Positive for arthralgias, gait problem, joint pain, joint swelling, muscle weakness and morning stiffness.  Skin: Positive for rash.  Neurological: Positive for weakness.  Hematological: Negative for bruising/bleeding tendency.  Psychiatric/Behavioral: Positive for sleep disturbance.    PMFS History:  Patient Active Problem List   Diagnosis Date Noted  . Persistent atrial fibrillation ([place]) 04/11/2017  . Vitamin D deficiency 05/20/2016  . History of  pacemaker 05/20/2016  . History of stroke/[place]  05/20/2016  . Primary osteoarthritis of both hands 03/10/2016  . DDD cervical spine 03/10/2016  . 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 Chronic left shoulder pain 03/10/2016  . Trochanteric bursitis of right hip 03/10/2016  . Other fatigue 03/10/2016  . Myalgia 03/10/2016  . Other chronic pain 03/10/2016  . 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 Typical atrial flutter ([place])   . PVC's (premature ventricular contractions) 01/30/2015  . Pacemaker 04/30/2013  . MGUS (monoclonal gammopathy of unknown significance) 02/17/2013  . Orthostatic hypotension 04/11/2011  . Long term current use of anticoagulant therapy 03/24/2011  . S/P AVR 03/24/2011  . Pleural effusion 03/24/2011  . Hypothyroidism 11/22/2010  . Atrial flutter ([place]) 08/19/2010  . CHEST PAIN 04/06/2010  . HYPERLIPIDEMIA-MIXED 12/08/2007  . PRIMARY HYPERCOAGULABLE STATE 12/08/2007  . Coronary artery disease with exertional angina ([place]) 12/08/2007  . CAD, NATIVE VESSEL 12/08/2007  . AORTIC STENOSIS/ INSUFFICIENCY, NON-RHEUMATIC 12/08/2007  . Atrial fibrillation ([place]) 12/08/2007  . Chronotropic incompetence with sinus node dysfunction (HCC) 12/08/2007    Past Medical History:  Diagnosis Date  . Aortic stenosis    moderate aortic stenosis  . Arthritis   . Benign prostatic hypertrophy   . 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  . Coronary artery disease    status post multiple prior percutaneous coronary interventions, microvascular angina per Dr Olevia Perches  . Heart murmur   . Hypercoagulable state ([place])    chronically anticoagulated with  coumadin  . Hyperlipidemia   . Hyperthyroidism   . Hypothyroidism    Dr. Elyse Hsu  . MGUS (monoclonal gammopathy of unknown significance) 02/17/2013  . Paroxysmal atrial  fibrillation ([place])    DR. Lia Foyer,   . Stroke [facility])    1990    Family History  Problem Relation Age of Onset  . Heart disease Brother        Twin brother has coronary disease and recent AVR for AS  . Anesthesia problems Neg Hx   . Hypotension Neg Hx   . Malignant hyperthermia Neg Hx   . Pseudochol deficiency Neg Hx    Past Surgical History:  Procedure Laterality Date  . AORTIC VALVE REPLACEMENT  03/15/2011   Procedure: AORTIC VALVE REPLACEMENT (AVR);  Surgeon: Gaye Pollack, [name];  Location: [place];  Service: Open Heart Surgery;  Laterality: N/A;  . APPENDECTOMY    . CARDIAC CATHETERIZATION     11  . CARDIOVERSION    . CARDIOVERSION  04/15/2011   Procedure: CARDIOVERSION;  Surgeon: Loralie Champagne, [name];  Location: [place];  Service: Cardiovascular;  Laterality: N/A;  . CARDIOVERSION N/A 09/11/2014   Procedure: CARDIOVERSION;  Surgeon: Sueanne Margarita, [name];  Location: [facility] ENDOSCOPY;  Service: Cardiovascular;  Laterality: N/A;  . CARDIOVERSION N/A 06/27/2015   Procedure: CARDIOVERSION;  Surgeon: Thayer Headings, [name];  Location: [facility] ENDOSCOPY;  Service: Cardiovascular;  Laterality: N/A;  . CARDIOVERSION N/A 07/04/2015   Procedure: CARDIOVERSION;  Surgeon: Evans Lance, [name];  Location: [place];  Service: Cardiovascular;  Laterality: N/A;  . CARDIOVERSION N/A 04/13/2017   Procedure: CARDIOVERSION;  Surgeon: Sueanne Margarita, [name];  Location: [facility] ENDOSCOPY;  Service: Cardiovascular;  Laterality: N/A;  . EP IMPLANTABLE DEVICE N/A 06/16/2015   Procedure: Pacemaker Implant;  Surgeon: Evans Lance, [name];  Location: [place] CV LAB;  Service: Cardiovascular;  Laterality: N/A;  . hemrrhoidectomy    . LEFT AND RIGHT HEART CATHETERIZATION WITH CORONARY ANGIOGRAM Bilateral 02/01/2011   Procedure: LEFT AND RIGHT HEART CATHETERIZATION WITH CORONARY ANGIOGRAM;  Surgeon: Hillary Bow, [name];  Location: [facility] CATH LAB;  Service: Cardiovascular;  Laterality: Bilateral;  . MAZE  03/15/2011   Procedure: MAZE;   Surgeon: Gaye Pollack, [name];  Location: [place];  Service: Open Heart Surgery;  Laterality: N/A;  . PACEMAKER INSERTION  1991   Guidant PPM, most recent Generator Change by Dr Olevia Perches was 08/22/06  . RIGHT/LEFT HEART CATH AND CORONARY ANGIOGRAPHY N/A 07/06/2016   Procedure: Right/Left Heart Cath and Coronary Angiography;  Surgeon: Sherren Mocha, [name];  Location: [place] CV LAB;  Service: Cardiovascular;  Laterality: N/A;  . TEE WITHOUT CARDIOVERSION  04/15/2011   Procedure: TRANSESOPHAGEAL ECHOCARDIOGRAM (TEE);  Surgeon: Loralie Champagne, [name];  Location: [place];  Service: Cardiovascular;  Laterality: N/A;  . TEE WITHOUT CARDIOVERSION N/A 09/11/2014   Procedure: TRANSESOPHAGEAL ECHOCARDIOGRAM (TEE);  Surgeon: Sueanne Margarita, [name];  Location: [facility] ENDOSCOPY;  Service: Cardiovascular;  Laterality: N/A;   Social History   Social History Narrative   Married. Has grown children   Retired Horticulturist, commercial [name]        Objective: Vital Signs: BP 136/80 (BP Location: Left Arm, Patient Position: Sitting, Cuff Size: Normal)   Pulse 72   Resp 18   Ht 6\' 1"  (1.854 m)   Wt 239 lb (108.4 kg)   BMI 31.53 kg/m    Physical Exam  Constitutional: He is oriented to person, place, and time. He appears well-developed and well-nourished.  HENT:  Head: Normocephalic  and atraumatic.  Eyes: Pupils are equal, round, and reactive to light. Conjunctivae and EOM are normal.  Neck: Normal range of motion. Neck supple.  Cardiovascular: Normal rate, regular rhythm and normal heart sounds.  Defibrillator and pacemaker  Pulmonary/Chest: Effort normal and breath sounds normal.  Abdominal: Soft. Bowel sounds are normal.  Neurological: He is alert and oriented to person, place, and time.  Skin: Skin is warm and dry. Capillary refill takes less than 2 seconds.  Psychiatric: He has a normal mood and affect. His behavior is normal.  Nursing note and vitals reviewed.    Musculoskeletal Exam: C-spine some discomfort with range of  motion.  Thoracic and lumbar spine good range of motion.  He has discomfort with range of motion of his bilateral shoulder joints.  Elbow joints were in good range of motion.  He has thickening of bilateral PIP/DIP joints consistent with osteoarthritis.  Hip joints knee joints ankles MTPs PIPs in good range of motion with no synovitis.  CDAI Exam: No CDAI exam completed.    Investigation: No additional findings.   Imaging: No results found.  Speciality Comments: No specialty comments available.    Procedures:  Large Joint Inj: bilateral glenohumeral on 07/26/2017 1:40 PM Indications: pain Details: 27 G 1.5 in needle, posterior approach  Arthrogram: No  Medications (Right): 1.5 mL lidocaine 1 %; 40 mg triamcinolone acetonide 40 MG/ML Aspirate (Right): 0 mL Medications (Left): 1.5 mL lidocaine 1 %; 40 mg triamcinolone acetonide 40 MG/ML Aspirate (Left): 0 mL Outcome: tolerated well, no immediate complications Procedure, treatment alternatives, risks and benefits explained, specific risks discussed. Consent was given by the patient. Immediately prior to procedure a time out was called to verify the correct patient, procedure, equipment, support staff and site/side marked as required. Patient was prepped and draped in the usual sterile fashion.     Allergies: Contrast media [iodinated diagnostic agents]; Gadolinium derivatives; and Metrizamide   Assessment / Plan:     Visit Diagnoses: Primary osteoarthritis of both hands-he continues to have pain and stiffness in his bilateral hands and incomplete fist formation.  Primary osteoarthritis of both shoulders-he is on joint pain and discomfort in his bilateral shoulders and difficulty lifting his shoulder joints.  He requested cortisone injections to bilateral shoulders.  I took the permission of Dr. Katharina Caper.  He tolerated the procedure well.  I have advised him to monitor his blood pressure closely.  DDD (degenerative disc disease),  cervical-chronic pain  Spondylosis of lumbar region without myelopathy or radiculopathy-chronic pain  Trochanteric bursitis of right hip-he has been doing exercises.  Other chronic pain - hydrocodone. UDS: 5/14/2019Narc agreement: 03/31/2017  Other fatigue  History of vitamin D deficiency  Paroxysmal atrial fibrillation ([place]) - He has a pacemaker  Long term current use of anticoagulant therapy  MGUS (monoclonal gammopathy of unknown significance)  Coronary artery disease with exertional angina (HCC)  History of stroke - /[place]   Primary hypercoagulable state ([place])  S/P AVR  History of pacemaker    Orders: Orders Placed This Encounter  Procedures  . Large Joint Inj   No orders of the defined types were placed in this encounter.    Follow-Up Instructions: Return in about 6 months (around 01/26/2018) for Osteoarthritis,DDD.   Bo Merino, [name]  Note - This record has been created using Editor, commissioning.  Chart creation errors have been sought, but may not always  have been located. Such creation errors do not reflect on  the standard of medical care.

## 2017-07-26 ENCOUNTER — Ambulatory Visit (INDEPENDENT_AMBULATORY_CARE_PROVIDER_SITE_OTHER): Payer: Medicare Other | Admitting: Rheumatology

## 2017-07-26 ENCOUNTER — Encounter: Payer: Self-pay | Admitting: Rheumatology

## 2017-07-26 ENCOUNTER — Telehealth: Payer: Self-pay | Admitting: Cardiovascular Disease

## 2017-07-26 VITALS — BP 136/80 | HR 72 | Resp 18 | Ht 73.0 in | Wt 239.0 lb

## 2017-07-26 DIAGNOSIS — Z7901 Long term (current) use of anticoagulants: Secondary | ICD-10-CM | POA: Diagnosis not present

## 2017-07-26 DIAGNOSIS — R5383 Other fatigue: Secondary | ICD-10-CM

## 2017-07-26 DIAGNOSIS — Z950 Presence of cardiac pacemaker: Secondary | ICD-10-CM

## 2017-07-26 DIAGNOSIS — M19041 Primary osteoarthritis, right hand: Secondary | ICD-10-CM | POA: Diagnosis not present

## 2017-07-26 DIAGNOSIS — D6859 Other primary thrombophilia: Secondary | ICD-10-CM

## 2017-07-26 DIAGNOSIS — M7061 Trochanteric bursitis, right hip: Secondary | ICD-10-CM | POA: Diagnosis not present

## 2017-07-26 DIAGNOSIS — M19042 Primary osteoarthritis, left hand: Secondary | ICD-10-CM

## 2017-07-26 DIAGNOSIS — I480 Paroxysmal atrial fibrillation: Secondary | ICD-10-CM | POA: Diagnosis not present

## 2017-07-26 DIAGNOSIS — Z8639 Personal history of other endocrine, nutritional and metabolic disease: Secondary | ICD-10-CM

## 2017-07-26 DIAGNOSIS — I25118 Atherosclerotic heart disease of native coronary artery with other forms of angina pectoris: Secondary | ICD-10-CM

## 2017-07-26 DIAGNOSIS — M5030 Other cervical disc degeneration, unspecified cervical region: Secondary | ICD-10-CM

## 2017-07-26 DIAGNOSIS — G8929 Other chronic pain: Secondary | ICD-10-CM

## 2017-07-26 DIAGNOSIS — M19011 Primary osteoarthritis, right shoulder: Secondary | ICD-10-CM

## 2017-07-26 DIAGNOSIS — D472 Monoclonal gammopathy: Secondary | ICD-10-CM

## 2017-07-26 DIAGNOSIS — M47816 Spondylosis without myelopathy or radiculopathy, lumbar region: Secondary | ICD-10-CM

## 2017-07-26 DIAGNOSIS — M19012 Primary osteoarthritis, left shoulder: Secondary | ICD-10-CM

## 2017-07-26 DIAGNOSIS — Z8673 Personal history of transient ischemic attack (TIA), and cerebral infarction without residual deficits: Secondary | ICD-10-CM | POA: Diagnosis not present

## 2017-07-26 DIAGNOSIS — I2510 Atherosclerotic heart disease of native coronary artery without angina pectoris: Secondary | ICD-10-CM

## 2017-07-26 DIAGNOSIS — Z952 Presence of prosthetic heart valve: Secondary | ICD-10-CM

## 2017-07-26 MED ORDER — TRIAMCINOLONE ACETONIDE 40 MG/ML IJ SUSP
40.0000 mg | INTRAMUSCULAR | Status: AC | PRN
  Administered 2017-07-26: 40 mg via INTRA_ARTICULAR

## 2017-07-26 MED ORDER — LIDOCAINE HCL 1 % IJ SOLN
1.5000 mL | INTRAMUSCULAR | Status: AC | PRN
  Administered 2017-07-26: 1.5 mL

## 2017-07-26 NOTE — Telephone Encounter (Signed)
Phone call from Dr. Bo Merino wanting clearance for shoulder injection  She is not concerned about any bleeding. Was concerned about causing an arhythmia.  Dr. Velora Heckler is at low risk for any arrhythmias from a shoulder injection.    Mertie Moores, MD  07/26/2017 1:12 PM    [facility] [place],  [place], [place]  [address] Pager [phone] Phone: [phone]; Fax: [phone]

## 2017-08-01 MED FILL — LORazepam 1 MG TABS: 1 | 60 days supply | Qty: 60 | Fill #0

## 2017-08-01 MED FILL — predniSONE 10 MG TABS: 10 | 30 days supply | Qty: 30 | Fill #5

## 2017-08-01 MED FILL — BUTALB-ACETAMIN-CAFF 50-325: 50-325-40 | 90 days supply | Qty: 180 | Fill #0

## 2017-08-22 ENCOUNTER — Other Ambulatory Visit: Payer: Self-pay | Admitting: Cardiovascular Disease

## 2017-08-22 MED FILL — DOXAZOSIN MESYLATE 4 MG TAB: 4 | 90 days supply | Qty: 90 | Fill #1

## 2017-08-22 MED FILL — ROSUVASTATIN CALCIUM 20 MG: 20 | 90 days supply | Qty: 90 | Fill #0 | Status: TO

## 2017-08-22 MED FILL — ELIQUIS 5 MG TABLET: 5 | 90 days supply | Qty: 180 | Fill #1

## 2017-08-29 ENCOUNTER — Other Ambulatory Visit: Payer: Self-pay | Admitting: Oncology

## 2017-08-29 ENCOUNTER — Other Ambulatory Visit: Payer: Self-pay | Admitting: Rheumatology

## 2017-08-29 DIAGNOSIS — I2510 Atherosclerotic heart disease of native coronary artery without angina pectoris: Secondary | ICD-10-CM

## 2017-08-29 DIAGNOSIS — I4819 Other persistent atrial fibrillation: Secondary | ICD-10-CM

## 2017-08-29 DIAGNOSIS — E039 Hypothyroidism, unspecified: Secondary | ICD-10-CM

## 2017-08-29 DIAGNOSIS — D472 Monoclonal gammopathy: Secondary | ICD-10-CM

## 2017-08-29 DIAGNOSIS — Z7901 Long term (current) use of anticoagulants: Secondary | ICD-10-CM

## 2017-08-29 MED FILL — HYDROCODON-APAP 10-325: 10-325 | 30 days supply | Qty: 60 | Fill #0

## 2017-08-29 NOTE — Telephone Encounter (Signed)
Last Visit: 07/26/17 Next Visit due in November 2019 UDS: 07/12/17 Narc Agreement: 03/31/17  Okay to refill Hydrocodone?

## 2017-08-30 ENCOUNTER — Other Ambulatory Visit: Payer: Medicare Other

## 2017-09-02 ENCOUNTER — Other Ambulatory Visit (INDEPENDENT_AMBULATORY_CARE_PROVIDER_SITE_OTHER): Payer: Medicare Other

## 2017-09-02 DIAGNOSIS — E039 Hypothyroidism, unspecified: Secondary | ICD-10-CM | POA: Diagnosis not present

## 2017-09-02 DIAGNOSIS — I481 Persistent atrial fibrillation: Secondary | ICD-10-CM | POA: Diagnosis not present

## 2017-09-02 DIAGNOSIS — D472 Monoclonal gammopathy: Secondary | ICD-10-CM | POA: Diagnosis not present

## 2017-09-02 DIAGNOSIS — Z7901 Long term (current) use of anticoagulants: Secondary | ICD-10-CM | POA: Diagnosis not present

## 2017-09-02 DIAGNOSIS — I4819 Other persistent atrial fibrillation: Secondary | ICD-10-CM

## 2017-09-02 DIAGNOSIS — I2510 Atherosclerotic heart disease of native coronary artery without angina pectoris: Secondary | ICD-10-CM

## 2017-09-02 DIAGNOSIS — R7303 Prediabetes: Secondary | ICD-10-CM | POA: Diagnosis not present

## 2017-09-05 ENCOUNTER — Encounter: Payer: Medicare Other | Admitting: Oncology

## 2017-09-05 LAB — LIPID PANEL
Chol/HDL Ratio: 2.2 ratio (ref 0.0–5.0)
Cholesterol, Total: 158 mg/dL (ref 100–199)
HDL: 72 mg/dL (ref >39)
LDL Calculated: 68 mg/dL (ref 0–99)
TRIGLYCERIDES: 92 mg/dL (ref 0–149)
VLDL Cholesterol Cal: 18 mg/dL (ref 5–40)

## 2017-09-05 LAB — CBC WITH DIFFERENTIAL/PLATELET
BASOS ABS: 0 10*3/uL (ref 0.0–0.2)
Basos: 0 %
EOS (ABSOLUTE): 0.2 10*3/uL (ref 0.0–0.4)
EOS: 4 %
HEMOGLOBIN: 12 g/dL — AB (ref 13.0–17.7)
Hematocrit: 36.7 % — ABNORMAL LOW (ref 37.5–51.0)
IMMATURE GRANS (ABS): 0 10*3/uL (ref 0.0–0.1)
Immature Granulocytes: 0 %
LYMPHS ABS: 1 10*3/uL (ref 0.7–3.1)
Lymphs: 17 %
MCH: 28.9 pg (ref 26.6–33.0)
MCHC: 32.7 g/dL (ref 31.5–35.7)
MCV: 88 fL (ref 79–97)
MONOCYTES: 6 %
Monocytes Absolute: 0.4 10*3/uL (ref 0.1–0.9)
NEUTROS ABS: 4.1 10*3/uL (ref 1.4–7.0)
Neutrophils: 73 %
Platelets: 213 10*3/uL (ref 150–450)
RBC: 4.15 x10E6/uL (ref 4.14–5.80)
RDW: 14.2 % (ref 12.3–15.4)
WBC: 5.7 10*3/uL (ref 3.4–10.8)

## 2017-09-05 LAB — COMPREHENSIVE METABOLIC PANEL
A/G RATIO: 1.4 (ref 1.2–2.2)
ALBUMIN: 3.9 g/dL (ref 3.5–4.7)
ALT: 19 IU/L (ref 0–44)
AST: 20 IU/L (ref 0–40)
Alkaline Phosphatase: 77 IU/L (ref 39–117)
BUN / CREAT RATIO: 13 (ref 10–24)
BUN: 11 mg/dL (ref 8–27)
CALCIUM: 9 mg/dL (ref 8.6–10.2)
CHLORIDE: 104 mmol/L (ref 96–106)
CO2: 25 mmol/L (ref 20–29)
Creatinine, Ser: 0.88 mg/dL (ref 0.76–1.27)
GFR, EST AFRICAN AMERICAN: 93 mL/min/{1.73_m2} (ref >59)
GFR, EST NON AFRICAN AMERICAN: 81 mL/min/{1.73_m2} (ref >59)
GLOBULIN, TOTAL: 2.8 g/dL (ref 1.5–4.5)
Glucose: 107 mg/dL — ABNORMAL HIGH (ref 65–99)
POTASSIUM: 4.8 mmol/L (ref 3.5–5.2)
SODIUM: 141 mmol/L (ref 134–144)

## 2017-09-05 LAB — IMMUNOFIXATION ELECTROPHORESIS
IGG (IMMUNOGLOBIN G), SERUM: 853 mg/dL (ref 700–1600)
IgA/Immunoglobulin A, Serum: 150 mg/dL (ref 61–437)
IgM (Immunoglobulin M), Srm: 730 mg/dL — ABNORMAL HIGH (ref 15–143)
Total Protein: 6.7 g/dL (ref 6.0–8.5)

## 2017-09-05 LAB — KAPPA/LAMBDA LIGHT CHAINS
IG KAPPA FREE LIGHT CHAIN: 22.9 mg/L — AB (ref 3.3–19.4)
IG LAMBDA FREE LIGHT CHAIN: 27.2 mg/L — AB (ref 5.7–26.3)
KAPPA/LAMBDA FLC RATIO: 0.84 (ref 0.26–1.65)

## 2017-09-05 LAB — LACTATE DEHYDROGENASE: LDH: 224 IU/L (ref 121–224)

## 2017-09-05 LAB — HEMOGLOBIN A1C
ESTIMATED AVERAGE GLUCOSE: 126 mg/dL
HEMOGLOBIN A1C: 6 % — AB (ref 4.8–5.6)

## 2017-09-05 LAB — TSH: TSH: 2.75 u[IU]/mL (ref 0.450–4.500)

## 2017-09-12 ENCOUNTER — Encounter: Payer: Medicare Other | Admitting: Oncology

## 2017-09-21 ENCOUNTER — Encounter: Payer: Self-pay | Admitting: Oncology

## 2017-09-21 DIAGNOSIS — R7303 Prediabetes: Secondary | ICD-10-CM | POA: Insufficient documentation

## 2017-09-21 HISTORY — DX: Prediabetes: R73.03

## 2017-10-04 ENCOUNTER — Other Ambulatory Visit: Payer: Self-pay | Admitting: Rheumatology

## 2017-10-04 ENCOUNTER — Other Ambulatory Visit: Payer: Self-pay | Admitting: Internal Medicine

## 2017-10-04 DIAGNOSIS — I480 Paroxysmal atrial fibrillation: Secondary | ICD-10-CM

## 2017-10-04 MED FILL — METOPROLOL SUCCINATE ER 100: 100 | 90 days supply | Qty: 180 | Fill #0

## 2017-10-04 MED FILL — LORazepam 1 MG TABS: 1 | 60 days supply | Qty: 60 | Fill #1

## 2017-10-04 MED FILL — HYDROCODON-APAP 10-325: 10-325 | 30 days supply | Qty: 60 | Fill #0

## 2017-10-04 MED FILL — predniSONE 5 MG TABS: 5 | 90 days supply | Qty: 100 | Fill #0

## 2017-10-04 MED FILL — POTASSIUM CL ER 20 MEQ TABL: 20 | 30 days supply | Qty: 60 | Fill #3

## 2017-10-04 NOTE — Telephone Encounter (Signed)
Last visit: 07/26/2017 Next visit: message sent to the front desk to schedule patient.  UDS: 07/12/2017 c/w narc agreement: 03/31/2017  Okay to refill hydrocodone?

## 2017-10-06 ENCOUNTER — Telehealth: Payer: Self-pay | Admitting: Rheumatology

## 2017-10-06 NOTE — Telephone Encounter (Signed)
-----   Message from [place] sent at 10/04/2017 11:00 AM EDT ----- Please call patient and schedule follow up. Patient is due November 2019. Thanks!

## 2017-10-06 NOTE — Telephone Encounter (Signed)
LMOM for patient to call and schedule his follow-up appointment

## 2017-10-17 ENCOUNTER — Encounter: Payer: Medicare Other | Admitting: *Deleted

## 2017-10-18 ENCOUNTER — Telehealth: Payer: Self-pay | Admitting: Cardiology

## 2017-10-18 NOTE — Telephone Encounter (Signed)
Spoke with pt and reminded pt of remote transmission that is due today. Pt verbalized understanding.   

## 2017-10-19 ENCOUNTER — Encounter: Payer: Self-pay | Admitting: Cardiology

## 2017-11-04 ENCOUNTER — Other Ambulatory Visit: Payer: Self-pay | Admitting: Physician Assistant

## 2017-11-04 MED FILL — BUTALB-ACETAMIN-CAFF 50-325: 50-325-40 | 90 days supply | Qty: 180 | Fill #0

## 2017-11-04 MED FILL — POTASSIUM CL ER 20 MEQ TABL: 20 | 30 days supply | Qty: 60 | Fill #4

## 2017-11-04 NOTE — Telephone Encounter (Signed)
Ok. Please, check the status of UDS and narcotic agreement.

## 2017-11-04 NOTE — Telephone Encounter (Signed)
Last visit: 07/26/2017 Next visit: message sent to the front desk to schedule patient.  UDS: 07/12/2017 c/w narc agreement: 03/31/2017

## 2017-11-10 DIAGNOSIS — Z85828 Personal history of other malignant neoplasm of skin: Secondary | ICD-10-CM | POA: Diagnosis not present

## 2017-11-10 DIAGNOSIS — L821 Other seborrheic keratosis: Secondary | ICD-10-CM | POA: Diagnosis not present

## 2017-11-10 DIAGNOSIS — L570 Actinic keratosis: Secondary | ICD-10-CM | POA: Diagnosis not present

## 2017-11-11 MED FILL — HYDROCODON-APAP 10-325: 10-325 | 30 days supply | Qty: 60 | Fill #0

## 2017-11-15 NOTE — Progress Notes (Signed)
Office Visit Note  Patient: Kevin Buff, MD             Date of Birth: [birth year]           MRN: [id number]             PCP: [name], MD Referring: Altheimer, Legrand Como, MD Visit Date: 11/16/2017 Occupation: @GUAROCC @  Subjective:  Pain in muscles.   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  He has been experiencing increased pain in the muscles of his upper extremities and lower extremities.  He states he is having difficulty walking long distance.  He continues to take hydrocodone for ongoing pain from osteoarthritis.  He continues to have pain in his right trochanteric bursa.  He has not gone to the physical therapist yet.  Activities of Daily Living:  Patient reports morning stiffness for all day hours.   Patient Reports nocturnal pain.  Difficulty dressing/grooming: Denies Difficulty climbing stairs: Reports Difficulty getting out of chair: Reports Difficulty using hands for taps, buttons, cutlery, and/or writing: Denies  Review of Systems  Constitutional: Positive for fatigue. Negative for night sweats.  HENT: Positive for mouth dryness. Negative for mouth sores and nose dryness.   Eyes: Negative for redness and dryness.  Respiratory: Negative for shortness of breath and difficulty breathing.   Cardiovascular: Positive for palpitations. Negative for chest pain, hypertension, irregular heartbeat and swelling in legs/feet.  Gastrointestinal: Negative for constipation and diarrhea.  Endocrine: Negative for increased urination.  Musculoskeletal: Positive for arthralgias, joint pain, myalgias, morning stiffness and myalgias. Negative for joint swelling, muscle weakness and muscle tenderness.  Skin: Negative for color change, rash, hair loss, nodules/bumps, skin tightness, ulcers and sensitivity to sunlight.  Allergic/Immunologic: Negative for susceptible to infections.  Neurological: Negative for  dizziness, fainting, memory loss, night sweats and weakness ( ).  Hematological: Negative for swollen glands.  Psychiatric/Behavioral: Negative for depressed mood and sleep disturbance. The patient is not nervous/anxious.     PMFS History:  Patient Active Problem List   Diagnosis Date Noted  . Prediabetes 09/21/2017  . Persistent atrial fibrillation ([place]) 04/11/2017  . Vitamin D deficiency 05/20/2016  . History of pacemaker 05/20/2016  . History of stroke/[place]  05/20/2016  . Primary osteoarthritis of both hands 03/10/2016  . DDD cervical spine 03/10/2016  . 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 Chronic left shoulder pain 03/10/2016  . Trochanteric bursitis of right hip 03/10/2016  . Other fatigue 03/10/2016  . Myalgia 03/10/2016  . Other chronic pain 03/10/2016  . 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 Typical atrial flutter ([place])   . PVC's (premature ventricular contractions) 01/30/2015  . Pacemaker 04/30/2013  . MGUS (monoclonal gammopathy of unknown significance) 02/17/2013  . Orthostatic hypotension 04/11/2011  . Long term current use of anticoagulant therapy 03/24/2011  . S/P AVR 03/24/2011  . Pleural effusion 03/24/2011  . Hypothyroidism 11/22/2010  . Atrial flutter ([place]) 08/19/2010  . CHEST PAIN 04/06/2010  . HYPERLIPIDEMIA-MIXED 12/08/2007  . PRIMARY HYPERCOAGULABLE STATE 12/08/2007  . Coronary artery disease with exertional angina ([place]) 12/08/2007  . CAD, NATIVE VESSEL 12/08/2007  . AORTIC STENOSIS/ INSUFFICIENCY, NON-RHEUMATIC 12/08/2007  . Atrial fibrillation ([place]) 12/08/2007  . Chronotropic incompetence with sinus node dysfunction (HCC) 12/08/2007    Past Medical History:  Diagnosis Date  . Aortic stenosis    moderate aortic stenosis  . Arthritis   . Benign prostatic hypertrophy   . Chronotropic incompetence with  sinus node dysfunction (HCC)    Status post Guidant dual-mode, dual-pacing, dual-sensing  pacemaker   implantation now programmed to  AAI with recent generator change.  . Coronary artery disease    status post multiple prior percutaneous coronary interventions, microvascular angina per Dr Olevia Perches  . Heart murmur   . Hypercoagulable state ([place])    chronically anticoagulated with coumadin  . Hyperlipidemia   . Hyperthyroidism   . Hypothyroidism    Dr. Elyse Hsu  . MGUS (monoclonal gammopathy of unknown significance) 02/17/2013  . Paroxysmal atrial fibrillation ([place])    DR. Lia Foyer,   . Prediabetes 09/21/2017  . Stroke [facility])    1990    Family History  Problem Relation Age of Onset  . Heart disease Brother        Twin brother has coronary disease and recent AVR for AS  . Anesthesia problems Neg Hx   . Hypotension Neg Hx   . Malignant hyperthermia Neg Hx   . Pseudochol deficiency Neg Hx    Past Surgical History:  Procedure Laterality Date  . AORTIC VALVE REPLACEMENT  03/15/2011   Procedure: AORTIC VALVE REPLACEMENT (AVR);  Surgeon: Gaye Pollack, MD;  Location: [place];  Service: Open Heart Surgery;  Laterality: N/A;  . APPENDECTOMY    . CARDIAC CATHETERIZATION     11  . CARDIOVERSION    . CARDIOVERSION  04/15/2011   Procedure: CARDIOVERSION;  Surgeon: Loralie Champagne, MD;  Location: [place];  Service: Cardiovascular;  Laterality: N/A;  . CARDIOVERSION N/A 09/11/2014   Procedure: CARDIOVERSION;  Surgeon: Sueanne Margarita, MD;  Location: [facility] ENDOSCOPY;  Service: Cardiovascular;  Laterality: N/A;  . CARDIOVERSION N/A 06/27/2015   Procedure: CARDIOVERSION;  Surgeon: Thayer Headings, MD;  Location: [facility] ENDOSCOPY;  Service: Cardiovascular;  Laterality: N/A;  . CARDIOVERSION N/A 07/04/2015   Procedure: CARDIOVERSION;  Surgeon: Evans Lance, MD;  Location: [place];  Service: Cardiovascular;  Laterality: N/A;  . CARDIOVERSION N/A 04/13/2017   Procedure: CARDIOVERSION;  Surgeon: Sueanne Margarita, MD;  Location: [facility] ENDOSCOPY;  Service: Cardiovascular;  Laterality: N/A;  . EP IMPLANTABLE DEVICE N/A 06/16/2015   Procedure:  Pacemaker Implant;  Surgeon: Evans Lance, MD;  Location: [place] CV LAB;  Service: Cardiovascular;  Laterality: N/A;  . hemrrhoidectomy    . LEFT AND RIGHT HEART CATHETERIZATION WITH CORONARY ANGIOGRAM Bilateral 02/01/2011   Procedure: LEFT AND RIGHT HEART CATHETERIZATION WITH CORONARY ANGIOGRAM;  Surgeon: Hillary Bow, MD;  Location: [facility] CATH LAB;  Service: Cardiovascular;  Laterality: Bilateral;  . MAZE  03/15/2011   Procedure: MAZE;  Surgeon: Gaye Pollack, MD;  Location: [place];  Service: Open Heart Surgery;  Laterality: N/A;  . PACEMAKER INSERTION  1991   Guidant PPM, most recent Generator Change by Dr Olevia Perches was 08/22/06  . RIGHT/LEFT HEART CATH AND CORONARY ANGIOGRAPHY N/A 07/06/2016   Procedure: Right/Left Heart Cath and Coronary Angiography;  Surgeon: Sherren Mocha, MD;  Location: [place] CV LAB;  Service: Cardiovascular;  Laterality: N/A;  . TEE WITHOUT CARDIOVERSION  04/15/2011   Procedure: TRANSESOPHAGEAL ECHOCARDIOGRAM (TEE);  Surgeon: Loralie Champagne, MD;  Location: [place];  Service: Cardiovascular;  Laterality: N/A;  . TEE WITHOUT CARDIOVERSION N/A 09/11/2014   Procedure: TRANSESOPHAGEAL ECHOCARDIOGRAM (TEE);  Surgeon: Sueanne Margarita, MD;  Location[facility] ENDOSCOPY;  Service: Cardiovascular;  Laterality: N/A;   Social History   Social History Narrative   Married. Has grown children   Retired Horticulturist, commercial MD       Objective: Vital  Signs: BP 128/85 (BP Location: Left Arm, Patient Position: Sitting, Cuff Size: Normal)   Pulse 76   Resp 15   Ht 6\' 1"  (1.854 m)   Wt 222 lb (100.7 kg)   BMI 29.29 kg/m    Physical Exam  Constitutional: He is oriented to person, place, and time. He appears well-developed and well-nourished.  HENT:  Head: Normocephalic and atraumatic.  Eyes: Pupils are equal, round, and reactive to light. Conjunctivae and EOM are normal.  Neck: Normal range of motion. Neck supple.  Cardiovascular: Normal rate, regular rhythm and normal heart sounds.   Pace maker  Pulmonary/Chest: Effort normal and breath sounds normal.  Abdominal: Soft. Bowel sounds are normal.  Neurological: He is alert and oriented to person, place, and time.  Skin: Skin is warm and dry. Capillary refill takes less than 2 seconds.  Psychiatric: He has a normal mood and affect. His behavior is normal.  Nursing note and vitals reviewed.    Musculoskeletal Exam: C-spine limited range of motion with discomfort, shoulder joints very limited range of motion in his left shoulder and about 30 degrees right shoulder joint about 120 degrees.  He had discomfort in bilateral shoulders.  Elbow joints wrist joints were in good range of motion.  He is in complete fist formation due to severe osteoarthritis involving PIPs and DIPs.  He had tenderness on palpation of the right trochanteric bursa.  Wrist joints were in good range of motion.  CDAI Exam: CDAI Score: Not documented Patient Global Assessment: Not documented; Provider Global Assessment: Not documented Swollen: Not documented; Tender: Not documented Joint Exam   Not documented   There is currently no information documented on the homunculus. Go to the Rheumatology activity and complete the homunculus joint exam.  Investigation: No additional findings.  Imaging: No results found.  Recent Labs: Lab Results  Component Value Date   WBC 5.7 09/02/2017   HGB 12.0 (L) 09/02/2017   PLT 213 09/02/2017   NA 141 09/02/2017   K 4.8 09/02/2017   CL 104 09/02/2017   CO2 25 09/02/2017   GLUCOSE 107 (H) 09/02/2017   BUN 11 09/02/2017   CREATININE 0.88 09/02/2017   BILITOT <0.2 09/02/2017   ALKPHOS 77 09/02/2017   AST 20 09/02/2017   ALT 19 09/02/2017   PROT 6.7 09/02/2017   ALBUMIN 3.9 09/02/2017   CALCIUM 9.0 09/02/2017   GFRAA 93 09/02/2017    Speciality Comments: No specialty comments available.  Procedures:  No procedures performed Allergies: Contrast media [iodinated diagnostic agents]; Gadolinium  derivatives; and Metrizamide   Assessment / Plan:     Visit Diagnoses: Primary osteoarthritis of both hands-he has severe osteoarthritis in his bilateral hands with decreased range of motion.  He has been trying to do some stretching exercises.  Primary osteoarthritis of both shoulders-he has limited range of motion of bilateral shoulders with discomfort.  DDD (degenerative disc disease), cervical-he continues to have discomfort in his cervical region.  DDD (degenerative disc disease), lumbar-he does not have much lower back pain currently.  Trochanteric bursitis of right hip-he has difficulty walking due to right trochanteric bursitis.  He has not been to physical therapy.    Other fatigue -he has been experiencing increased muscle pain and increased fatigue.  Plan: CK, Sedimentation rate  Other chronic pain - hydrocodone.  He has been on the same dose of hydrocodone for many years.  He states he takes it on PRN basis to manage discomfort.  He is unable to take any  NSAIDs due to being on anticoagulants.  UDS: 5/14/2019Narc agreement: 03/31/2017  History of pacemaker  S/P AVR  History of vitamin D deficiency-he is on vitamin D supplement.  Paroxysmal atrial fibrillation (HCC) - He has a pacemaker  MGUS (monoclonal gammopathy of unknown significance)-followed up by hematology.  History of stroke -  /[place]   Primary hypercoagulable state [facility])  Coronary artery disease with exertional angina ([place])  Long term current use of anticoagulant therapy  Medication monitoring encounter - Plan: Pain Mgmt, Profile 5 w/Conf, U   Orders: Orders Placed This Encounter  Procedures  . CK  . Sedimentation rate  . Pain Mgmt, Profile 5 w/Conf, U   No orders of the defined types were placed in this encounter.   Face-to-face time spent with patient was 30 minutes. Greater than 50% of time was spent in counseling and coordination of care.  Follow-Up Instructions: Return in about 6 months  (around 05/17/2018) for Osteoarthritis.   Bo Merino, MD  Note - This record has been created using Editor, commissioning.  Chart creation errors have been sought, but may not always  have been located. Such creation errors do not reflect on  the standard of medical care.

## 2017-11-16 ENCOUNTER — Encounter: Payer: Self-pay | Admitting: Rheumatology

## 2017-11-16 ENCOUNTER — Ambulatory Visit (INDEPENDENT_AMBULATORY_CARE_PROVIDER_SITE_OTHER): Payer: Medicare Other | Admitting: Rheumatology

## 2017-11-16 VITALS — BP 128/85 | HR 76 | Resp 15 | Ht 73.0 in | Wt 222.0 lb

## 2017-11-16 DIAGNOSIS — M5136 Other intervertebral disc degeneration, lumbar region: Secondary | ICD-10-CM

## 2017-11-16 DIAGNOSIS — R5383 Other fatigue: Secondary | ICD-10-CM | POA: Diagnosis not present

## 2017-11-16 DIAGNOSIS — Z5181 Encounter for therapeutic drug level monitoring: Secondary | ICD-10-CM

## 2017-11-16 DIAGNOSIS — I25118 Atherosclerotic heart disease of native coronary artery with other forms of angina pectoris: Secondary | ICD-10-CM

## 2017-11-16 DIAGNOSIS — Z950 Presence of cardiac pacemaker: Secondary | ICD-10-CM | POA: Diagnosis not present

## 2017-11-16 DIAGNOSIS — M5030 Other cervical disc degeneration, unspecified cervical region: Secondary | ICD-10-CM | POA: Diagnosis not present

## 2017-11-16 DIAGNOSIS — Z8639 Personal history of other endocrine, nutritional and metabolic disease: Secondary | ICD-10-CM | POA: Diagnosis not present

## 2017-11-16 DIAGNOSIS — M7061 Trochanteric bursitis, right hip: Secondary | ICD-10-CM | POA: Diagnosis not present

## 2017-11-16 DIAGNOSIS — M19011 Primary osteoarthritis, right shoulder: Secondary | ICD-10-CM

## 2017-11-16 DIAGNOSIS — I2510 Atherosclerotic heart disease of native coronary artery without angina pectoris: Secondary | ICD-10-CM

## 2017-11-16 DIAGNOSIS — Z8673 Personal history of transient ischemic attack (TIA), and cerebral infarction without residual deficits: Secondary | ICD-10-CM

## 2017-11-16 DIAGNOSIS — D472 Monoclonal gammopathy: Secondary | ICD-10-CM | POA: Diagnosis not present

## 2017-11-16 DIAGNOSIS — Z952 Presence of prosthetic heart valve: Secondary | ICD-10-CM

## 2017-11-16 DIAGNOSIS — M19012 Primary osteoarthritis, left shoulder: Secondary | ICD-10-CM

## 2017-11-16 DIAGNOSIS — M19042 Primary osteoarthritis, left hand: Secondary | ICD-10-CM

## 2017-11-16 DIAGNOSIS — Z7901 Long term (current) use of anticoagulants: Secondary | ICD-10-CM

## 2017-11-16 DIAGNOSIS — G8929 Other chronic pain: Secondary | ICD-10-CM

## 2017-11-16 DIAGNOSIS — M19041 Primary osteoarthritis, right hand: Secondary | ICD-10-CM

## 2017-11-16 DIAGNOSIS — D6859 Other primary thrombophilia: Secondary | ICD-10-CM

## 2017-11-16 DIAGNOSIS — I480 Paroxysmal atrial fibrillation: Secondary | ICD-10-CM | POA: Diagnosis not present

## 2017-11-17 LAB — CK: CK TOTAL: 113 U/L (ref 44–196)

## 2017-11-17 LAB — SEDIMENTATION RATE: Sed Rate: 34 mm/h — ABNORMAL HIGH (ref 0–20)

## 2017-11-17 NOTE — Progress Notes (Signed)
Please notify patient that his CK is normal and his sed rate is mildly elevated which probably secondary to anemia.

## 2017-11-19 LAB — PAIN MGMT, PROFILE 5 W/CONF, U
ALPHAHYDROXYTRIAZOLAM: NEGATIVE ng/mL (ref <50)
AMINOCLONAZEPAM: NEGATIVE ng/mL (ref <25)
AMPHETAMINES: NEGATIVE ng/mL (ref <500)
Alphahydroxyalprazolam: NEGATIVE ng/mL (ref <25)
Alphahydroxymidazolam: NEGATIVE ng/mL (ref <50)
Amobarbital: NEGATIVE ng/mL (ref <100)
BENZODIAZEPINES: POSITIVE ng/mL — AB (ref <100)
Barbiturates: POSITIVE ng/mL — AB (ref <300)
Butalbital: 1782 ng/mL — ABNORMAL HIGH (ref <100)
CODEINE: NEGATIVE ng/mL (ref <50)
Cocaine Metabolite: NEGATIVE ng/mL (ref <150)
Creatinine: 78.1 mg/dL
HYDROCODONE: 276 ng/mL — AB (ref <50)
HYDROXYETHYLFLURAZEPAM: NEGATIVE ng/mL (ref <50)
Hydromorphone: 315 ng/mL — ABNORMAL HIGH (ref <50)
Lorazepam: 447 ng/mL — ABNORMAL HIGH (ref <50)
MARIJUANA METABOLITE: NEGATIVE ng/mL (ref <20)
Methadone Metabolite: NEGATIVE ng/mL (ref <100)
Morphine: NEGATIVE ng/mL (ref <50)
Nordiazepam: NEGATIVE ng/mL (ref <50)
Norhydrocodone: 1209 ng/mL — ABNORMAL HIGH (ref <50)
OPIATES: POSITIVE ng/mL — AB (ref <100)
OXAZEPAM: NEGATIVE ng/mL (ref <50)
OXYCODONE: NEGATIVE ng/mL (ref <100)
Oxidant: NEGATIVE ug/mL (ref <200)
PHENOBARBITAL: NEGATIVE ng/mL (ref <100)
Pentobarbital: NEGATIVE ng/mL (ref <100)
SECOBARBITAL: NEGATIVE ng/mL (ref <100)
TEMAZEPAM: NEGATIVE ng/mL (ref <50)
pH: 6.63 (ref 4.5–9.0)

## 2017-11-21 DIAGNOSIS — N401 Enlarged prostate with lower urinary tract symptoms: Secondary | ICD-10-CM | POA: Diagnosis not present

## 2017-11-21 DIAGNOSIS — R350 Frequency of micturition: Secondary | ICD-10-CM | POA: Diagnosis not present

## 2017-11-29 DIAGNOSIS — Z23 Encounter for immunization: Secondary | ICD-10-CM | POA: Diagnosis not present

## 2017-12-02 ENCOUNTER — Other Ambulatory Visit: Payer: Self-pay | Admitting: Cardiovascular Disease

## 2017-12-02 ENCOUNTER — Other Ambulatory Visit: Payer: Self-pay | Admitting: Internal Medicine

## 2017-12-02 DIAGNOSIS — I480 Paroxysmal atrial fibrillation: Secondary | ICD-10-CM

## 2017-12-02 MED FILL — LORazepam 1 MG TABS: 1 | 60 days supply | Qty: 60 | Fill #2

## 2017-12-02 MED FILL — DOXAZOSIN MESYLATE 4 MG TAB: 4 | 90 days supply | Qty: 90 | Fill #2

## 2017-12-02 MED FILL — ELIQUIS 5 MG TABLET: 5 | 90 days supply | Qty: 180 | Fill #0

## 2017-12-02 MED FILL — ROSUVASTATIN CALCIUM 20 MG: 20 | 90 days supply | Qty: 90 | Fill #1 | Status: TO

## 2017-12-02 NOTE — Telephone Encounter (Signed)
Pt saw Dr Lovena Le on 07/18/17. Last noted weight was 100.7Kg on 11/16/17. SCr on 09/02/17 was 0.88. Will refill Eliquis 5mg  BID.

## 2017-12-09 ENCOUNTER — Other Ambulatory Visit: Payer: Self-pay | Admitting: *Deleted

## 2017-12-09 DIAGNOSIS — M7061 Trochanteric bursitis, right hip: Secondary | ICD-10-CM

## 2017-12-09 DIAGNOSIS — M7918 Myalgia, other site: Secondary | ICD-10-CM

## 2017-12-09 NOTE — Progress Notes (Signed)
amb  

## 2017-12-19 ENCOUNTER — Other Ambulatory Visit: Payer: Self-pay | Admitting: Physician Assistant

## 2017-12-19 ENCOUNTER — Other Ambulatory Visit: Payer: Self-pay | Admitting: Internal Medicine

## 2017-12-19 DIAGNOSIS — R3914 Feeling of incomplete bladder emptying: Secondary | ICD-10-CM | POA: Diagnosis not present

## 2017-12-19 DIAGNOSIS — N401 Enlarged prostate with lower urinary tract symptoms: Secondary | ICD-10-CM | POA: Diagnosis not present

## 2017-12-19 DIAGNOSIS — K5732 Diverticulitis of large intestine without perforation or abscess without bleeding: Secondary | ICD-10-CM

## 2017-12-19 DIAGNOSIS — R3915 Urgency of urination: Secondary | ICD-10-CM | POA: Diagnosis not present

## 2017-12-19 MED FILL — HYDROCODON-APAP 10-325: 10-325 | 30 days supply | Qty: 60 | Fill #0

## 2017-12-19 NOTE — Telephone Encounter (Signed)
Please advise Sir? Thank you. 

## 2017-12-19 NOTE — Telephone Encounter (Signed)
Last Visit: 11/16/17 Next Visit due 04/2018 UDS: 11/16/17 Narc Agreement: 11/16/17  Okay to refill Hydrocodone? 

## 2017-12-22 MED FILL — AMOX-CLAV 875-125 MG TABLET: 875-125 | 10 days supply | Qty: 20 | Fill #0

## 2017-12-22 NOTE — Telephone Encounter (Signed)
Left Dr Velora Heckler a detailed message to call me so I can get an update.

## 2017-12-22 NOTE — Telephone Encounter (Signed)
Dr Bernita Buffy called back and said he has a diverticulitis flare about every 2-3 months. He said he doesn't want surgery. He has one pill left and just wants a refill to have on hand. I told him I will send in the refill and update Dr Carlean Purl.

## 2017-12-22 NOTE — Telephone Encounter (Signed)
Call Dr. Lorin Picket and find out how he is and when he last needed to take antibiotics  I can refill it so he has it on hand but do need to know how he has been re: diverticulitis

## 2017-12-26 NOTE — Telephone Encounter (Signed)
Agree 

## 2017-12-28 ENCOUNTER — Encounter: Payer: Self-pay | Admitting: Internal Medicine

## 2018-01-16 ENCOUNTER — Other Ambulatory Visit: Payer: Self-pay

## 2018-01-16 ENCOUNTER — Encounter: Payer: Self-pay | Admitting: Physical Therapy

## 2018-01-16 ENCOUNTER — Ambulatory Visit: Payer: Medicare Other | Attending: Rheumatology | Admitting: Physical Therapy

## 2018-01-16 DIAGNOSIS — G8929 Other chronic pain: Secondary | ICD-10-CM | POA: Insufficient documentation

## 2018-01-16 DIAGNOSIS — M25512 Pain in left shoulder: Secondary | ICD-10-CM | POA: Insufficient documentation

## 2018-01-16 DIAGNOSIS — M25551 Pain in right hip: Secondary | ICD-10-CM | POA: Insufficient documentation

## 2018-01-16 DIAGNOSIS — M25511 Pain in right shoulder: Secondary | ICD-10-CM | POA: Insufficient documentation

## 2018-01-16 NOTE — Therapy (Signed)
[facility] [address] [place], [place], [address] Phone: [phone]   Fax:  [phone]  Physical Therapy Evaluation  Patient Details  Name: Kevin ESSNER, [name] MRN: [id number] Date of Birth: 1934/12/20 Referring Provider (PT): Bo Merino, [name]   Encounter Date: 01/16/2018  PT End of Session - 01/16/18 2047    Visit Number  1    Date for PT Re-Evaluation  03/13/18    Authorization Type  Medicare [employer] life supplement    Authorization Time Period  01/16/18-03/13/18    PT Start Time  1445    PT Stop Time  1530    PT Time Calculation (min)  45 min    Activity Tolerance  Patient tolerated treatment well;Patient limited by pain    Behavior During Therapy  [facility] for tasks assessed/performed       Past Medical History:  Diagnosis Date  . Aortic stenosis    moderate aortic stenosis  . Arthritis   . Benign prostatic hypertrophy   . 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  . Coronary artery disease    status post multiple prior percutaneous coronary interventions, microvascular angina per Dr Olevia Perches  . Heart murmur   . Hypercoagulable state ([place])    chronically anticoagulated with coumadin  . Hyperlipidemia   . Hyperthyroidism   . Hypothyroidism    Dr. Elyse Hsu  . MGUS (monoclonal gammopathy of unknown significance) 02/17/2013  . Paroxysmal atrial fibrillation ([place])    DR. Lia Foyer,   . Prediabetes 09/21/2017  . Stroke [facility])    1990    Past Surgical History:  Procedure Laterality Date  . AORTIC VALVE REPLACEMENT  03/15/2011   Procedure: AORTIC VALVE REPLACEMENT (AVR);  Surgeon: Gaye Pollack, [name];  Location: [place];  Service: Open Heart Surgery;  Laterality: N/A;  . APPENDECTOMY    . CARDIAC CATHETERIZATION     11  . CARDIOVERSION     . CARDIOVERSION  04/15/2011   Procedure: CARDIOVERSION;  Surgeon: Loralie Champagne, [name];  Location: [place];  Service: Cardiovascular;  Laterality: N/A;  . CARDIOVERSION N/A 09/11/2014   Procedure: CARDIOVERSION;  Surgeon: Sueanne Margarita, [name];  Location: [facility] ENDOSCOPY;  Service: Cardiovascular;  Laterality: N/A;  . CARDIOVERSION N/A 06/27/2015   Procedure: CARDIOVERSION;  Surgeon: Thayer Headings, [name];  Location: [facility] ENDOSCOPY;  Service: Cardiovascular;  Laterality: N/A;  . CARDIOVERSION N/A 07/04/2015   Procedure: CARDIOVERSION;  Surgeon: Evans Lance, [name];  Location: [place];  Service: Cardiovascular;  Laterality: N/A;  . CARDIOVERSION N/A 04/13/2017   Procedure: CARDIOVERSION;  Surgeon: Sueanne Margarita, [name];  Location: [facility] ENDOSCOPY;  Service: Cardiovascular;  Laterality: N/A;  . EP IMPLANTABLE DEVICE N/A 06/16/2015   Procedure: Pacemaker Implant;  Surgeon: Evans Lance, [name];  Location: [place] CV LAB;  Service: Cardiovascular;  Laterality: N/A;  . hemrrhoidectomy    . LEFT AND RIGHT HEART CATHETERIZATION WITH CORONARY ANGIOGRAM Bilateral 02/01/2011   Procedure: LEFT AND RIGHT HEART CATHETERIZATION WITH CORONARY ANGIOGRAM;  Surgeon: Hillary Bow, [name];  Location: [facility] CATH LAB;  Service: Cardiovascular;  Laterality: Bilateral;  . MAZE  03/15/2011   Procedure: MAZE;  Surgeon: Gaye Pollack, [name];  Location: [place];  Service: Open Heart Surgery;  Laterality: N/A;  . PACEMAKER INSERTION  1991   Guidant PPM, most recent Generator Change by Dr Olevia Perches was  08/22/06  . RIGHT/LEFT HEART CATH AND CORONARY ANGIOGRAPHY N/A 07/06/2016   Procedure: Right/Left Heart Cath and Coronary Angiography;  Surgeon: Sherren Mocha, [name];  Location: [place] CV LAB;  Service: Cardiovascular;  Laterality: N/A;  . TEE WITHOUT CARDIOVERSION  04/15/2011   Procedure: TRANSESOPHAGEAL ECHOCARDIOGRAM (TEE);  Surgeon: Loralie Champagne, [name];  Location: [place];  Service: Cardiovascular;  Laterality: N/A;  . TEE WITHOUT  CARDIOVERSION N/A 09/11/2014   Procedure: TRANSESOPHAGEAL ECHOCARDIOGRAM (TEE);  Surgeon: Sueanne Margarita, [name];  Location: [facility] ENDOSCOPY;  Service: Cardiovascular;  Laterality: N/A;    There were no vitals filed for this visit.   Subjective Assessment - 01/16/18 1455    Subjective  Pt presents with extensive medical history and history of chronic right hip pain and bil shoulder pain.  Hip pain is worse with walking and shoulders hurt with all UE use.  He is unable to sleep on his sides due to shoulder pain. He has primary osteoarthritis throughout multiple joints with primary report of pain and limition in bil shoulders, Lt>Rt.  He reports bil shoulder pain is worse than Rt hip pain.    Limitations  Walking;Lifting;House hold activities    How long can you sit comfortably?  no limit    How long can you walk comfortably?  1 min    Diagnostic tests  Pt cannot get MRIs due to pacemaker, xrays    Patient Stated Goals  reduce pain    Currently in Pain?  Yes    Pain Score  6     Pain Location  Hip    Pain Orientation  Right    Pain Descriptors / Indicators  Aching    Pain Type  Chronic pain    Pain Radiating Towards  does not radiate    Pain Onset  More than a month ago    Pain Frequency  Intermittent    Aggravating Factors   walking    Pain Relieving Factors  sitting, laying down    Effect of Pain on Daily Activities  limits walking to 1 min    Multiple Pain Sites  Yes    Pain Score  9    Pain Location  Shoulder    Pain Orientation  Right;Left    Pain Descriptors / Indicators  Aching;Tightness    Pain Type  Chronic pain    Pain Onset  More than a month ago    Pain Frequency  Constant    Aggravating Factors   all use of UEs    Pain Relieving Factors  meds, although Pt not sure how much it helps    Effect of Pain on Daily Activities  lifting, sleeping on sides, any activity requiring use of UEs         [facility] PT Assessment - 01/16/18 0001      Assessment   Medical Diagnosis  M70.61  (ICD-10-CM) - Trochanteric bursitis, right hip, M79.18 (ICD-10-CM) - Myofascial pain    Referring Provider (PT)  Bo Merino, [name]    Next [name] Visit  no    Prior Therapy  yes      Balance Screen   Has the patient fallen in the past 6 months  No    Has the patient had a decrease in activity level because of a fear of falling?   No    Is the patient reluctant to leave their home because of a fear of falling?   No      Home Environment   Living  Environment  Private residence    Living Arrangements  Spouse/significant other    Type of [place] entry    [place]  One level      Prior Function   Level of [place]  Retired    Leisure  read      Cognition   Overall Cognitive Status  Within Functional Limits for tasks assessed      Observation/Other Assessments   Focus on Therapeutic Outcomes (FOTO)   45% for Rt hip   expected outcome 46%     ROM / Strength   AROM / PROM / Strength  AROM;Strength;PROM      AROM   Overall AROM Comments  bil shoulder A/ROM limited by pain in bil shoulder joints    AROM Assessment Site  Shoulder;Hip    Right/Left Shoulder  Right;Left    Right Shoulder Extension  15 Degrees    Right Shoulder Flexion  80 Degrees    Right Shoulder ABduction  55 Degrees    Right Shoulder Internal Rotation  30 Degrees    Right Shoulder External Rotation  35 Degrees    Right Shoulder Horizontal ABduction  35 Degrees    Left Shoulder Extension  10 Degrees    Left Shoulder Flexion  35 Degrees    Left Shoulder Internal Rotation  30 Degrees    Left Shoulder External Rotation  30 Degrees    Left Shoulder Horizontal ABduction  25 Degrees    Right/Left Hip  Right;Left    Right Hip Extension  15    Right Hip Flexion  95    Right Hip External Rotation   65    Right Hip Internal Rotation   10    Right Hip ABduction  50    Left Hip Extension  15    Left Hip Flexion  110    Left Hip External  Rotation   75    Left Hip Internal Rotation   15    Left Hip ABduction  50      PROM   PROM Assessment Site  Shoulder    Right/Left Shoulder  Right;Left    Right Shoulder Flexion  140 Degrees    Left Shoulder Flexion  130 Degrees      Strength   Overall Strength Comments  grossly 5/5 for bil LEs, cardinal planes of motion bil shoulders performed arms at sides      Palpation   Palpation comment  significant tenderness present: Rt greater trochanter      Special Tests    Special Tests  Hip Special Tests    Hip Special Tests   Saralyn Pilar (FABER) Test;Hip Scouring      Saralyn Pilar [facility]) Test   Findings  Negative    Side  Right      Hip Scouring   Findings  Negative    Side  Right                Objective measurements completed on examination: See above findings.                PT Short Term Goals - 01/16/18 2109      PT SHORT TERM GOAL #1   Title  Pt will be ind in initial HEP.    Time  4    Period  Weeks    Status  New    Target Date  02/13/18  PT SHORT TERM GOAL #2   Title  Pt will achieve bil shoulder A/ROM to at least 90 degrees to increase functional use of bil UEs.    Time  5    Period  Weeks    Status  New    Target Date  02/20/18      PT SHORT TERM GOAL #3   Title  Pt will report reduced shoulder pain to < or = 6/10 throughout the day.    Time  5    Period  Weeks    Status  New    Target Date  02/20/18      PT SHORT TERM GOAL #4   Title  Pt will be able to tolerate walking at least 10 min without increased Rt hip pain.    Time  5    Period  Weeks    Status  New    Target Date  02/20/18        PT Long Term Goals - 01/16/18 2112      PT LONG TERM GOAL #1   Title  Pt will be ind in advanced HEP.    Time  8    Period  Weeks    Status  New    Target Date  03/13/18      PT LONG TERM GOAL #2   Title  Pt will achieve active bil shoulder flexion to at least 110 degrees to increase ability to perform overhead tasks.    Time  8     Period  Weeks    Status  New    Target Date  03/13/18      PT LONG TERM GOAL #3   Title  Pt will be able to tolerate at least 15 min of standing and/or walking activities without increased hip pain.    Time  8    Period  Weeks    Status  New    Target Date  03/13/18      PT LONG TERM GOAL #4   Title  Pt will reduce FOTO score for Rt hip to < or = 40% to demonstate reduced functional limitation.             Plan - 01/16/18 2048    Clinical Impression Statement  Pt is referred to PT with diagnoses of Rt trochanteric bursitis and myofascial pain.  His chief complaint is bilateral shoulder pain and secondary is right hip pain.  Pt has significant loss of A/ROM in Lt>Rt shoulders.  He reports increased shoulder pain with sleeping and during all activities requiring use of UEs.  Pain appears to be a greater limiting factor than strength for bil shoulders.  Pt has 5/5 strength throughout bil LEs with slight limitation in Rt hip ROM compared to Lt.  Greater trochanter was exquisitely tender to palpation on evaluation with slight decrease in hip ROM compared to Lt.  Pt will benefit from skilled PT and modalities as needed to address pain, ROM, flexibility, and functional strength to improve his tolerance of daily activities.    History and Personal Factors relevant to plan of care:  pacemaker, extensive medical history    Clinical Presentation  Stable    Clinical Decision Making  Low    Rehab Potential  Good    Clinical Impairments Affecting Rehab Potential  pacemaker, extensive medical history    PT Frequency  2x / week    PT Duration  8 weeks    PT Treatment/Interventions  ADLs/Self Care  Home Management;Cryotherapy;Iontophoresis 4mg /ml Dexamethasone;Moist Heat;Functional mobility training;Therapeutic exercise;Therapeutic activities;Balance training;Neuromuscular re-education;Patient/family education;Manual techniques;Passive range of motion;Dry needling;Spinal Manipulations;Joint  Manipulations    PT Next Visit Plan  bil shoulder P/ROM, joint mobs, A/AROM, begin shoulder HEP, dexa for Rt hip if approved by cardiologist    PT Home Exercise Plan  begin HEP    Consulted and Agree with Plan of Care  Patient       Patient will benefit from skilled therapeutic intervention in order to improve the following deficits and impairments:  Decreased range of motion, Difficulty walking, Increased fascial restricitons, Impaired UE functional use, Decreased activity tolerance, Pain, Hypomobility, Impaired flexibility, Decreased mobility, Decreased strength, Postural dysfunction  Visit Diagnosis: Chronic left shoulder pain - Plan: PT plan of care cert/re-cert  Chronic right shoulder pain - Plan: PT plan of care cert/re-cert  Pain in right hip - Plan: PT plan of care cert/re-cert     Problem List Patient Active Problem List   Diagnosis Date Noted  . Prediabetes 09/21/2017  . Persistent atrial fibrillation 04/11/2017  . Vitamin D deficiency 05/20/2016  . History of pacemaker 05/20/2016  . History of stroke/[place]  05/20/2016  . Primary osteoarthritis of both hands 03/10/2016  . DDD cervical spine 03/10/2016  . 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 Chronic left shoulder pain 03/10/2016  . Trochanteric bursitis of right hip 03/10/2016  . Other fatigue 03/10/2016  . Myalgia 03/10/2016  . Other chronic pain 03/10/2016  . 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 Typical atrial flutter ([place])   . PVC's (premature ventricular contractions) 01/30/2015  . Pacemaker 04/30/2013  . MGUS (monoclonal gammopathy of unknown significance) 02/17/2013  . Orthostatic hypotension 04/11/2011  . Long term current use of anticoagulant therapy 03/24/2011  . S/P AVR 03/24/2011  . Pleural effusion 03/24/2011  . Hypothyroidism 11/22/2010  . Atrial flutter ([place]) 08/19/2010  . CHEST PAIN 04/06/2010  . HYPERLIPIDEMIA-MIXED 12/08/2007  . PRIMARY HYPERCOAGULABLE STATE 12/08/2007  .  Coronary artery disease with exertional angina ([place]) 12/08/2007  . CAD, NATIVE VESSEL 12/08/2007  . AORTIC STENOSIS/ INSUFFICIENCY, NON-RHEUMATIC 12/08/2007  . Atrial fibrillation ([place]) 12/08/2007  . Chronotropic incompetence with sinus node dysfunction (HCC) 12/08/2007    Laren Orama, PT 01/16/18 9:20 PM   [facility] [address] [place], [place], [address] Phone: [phone]   Fax:  [phone]  Name: Kevin Buff, [name] MRN: [id number] Date of Birth: 1935-02-03

## 2018-01-19 MED FILL — FUROSEMIDE 20 MG TABS: 20 | 30 days supply | Qty: 30 | Fill #0

## 2018-01-19 MED FILL — predniSONE 5 MG TABS: 5 | 90 days supply | Qty: 100 | Fill #1

## 2018-01-19 MED FILL — POTASSIUM CHLORIDE CRYS ER: 20 | 30 days supply | Qty: 60 | Fill #5

## 2018-01-19 MED FILL — METOPROLOL SUCCINATE ER 100: 100 | 90 days supply | Qty: 180 | Fill #1

## 2018-01-19 MED FILL — AMOX-CLAV 875-125 MG TABLET: 875-125 | 7 days supply | Qty: 14 | Fill #0

## 2018-01-19 MED FILL — LORazepam 1 MG TABS: 1 | 60 days supply | Qty: 60 | Fill #0

## 2018-01-20 ENCOUNTER — Ambulatory Visit (INDEPENDENT_AMBULATORY_CARE_PROVIDER_SITE_OTHER): Payer: Medicare Other | Admitting: Internal Medicine

## 2018-01-20 ENCOUNTER — Encounter: Payer: Self-pay | Admitting: Internal Medicine

## 2018-01-20 VITALS — BP 124/74 | HR 64 | Ht 73.0 in | Wt 222.0 lb

## 2018-01-20 DIAGNOSIS — I739 Peripheral vascular disease, unspecified: Secondary | ICD-10-CM | POA: Diagnosis not present

## 2018-01-20 DIAGNOSIS — I2510 Atherosclerotic heart disease of native coronary artery without angina pectoris: Secondary | ICD-10-CM

## 2018-01-20 DIAGNOSIS — I4819 Other persistent atrial fibrillation: Secondary | ICD-10-CM | POA: Diagnosis not present

## 2018-01-20 DIAGNOSIS — Z950 Presence of cardiac pacemaker: Secondary | ICD-10-CM | POA: Diagnosis not present

## 2018-01-20 DIAGNOSIS — I495 Sick sinus syndrome: Secondary | ICD-10-CM

## 2018-01-20 DIAGNOSIS — R0602 Shortness of breath: Secondary | ICD-10-CM

## 2018-01-20 DIAGNOSIS — I209 Angina pectoris, unspecified: Secondary | ICD-10-CM | POA: Diagnosis not present

## 2018-01-20 MED ORDER — NITROGLYCERIN 0.4 MG SL SUBL: 0.4000 mg | SUBLINGUAL_TABLET | SUBLINGUAL | 1 refills | Status: DC | PRN

## 2018-01-20 MED ORDER — MUPIROCIN 2 % EX OINT: 1.0000 "application " | TOPICAL_OINTMENT | Freq: Two times a day (BID) | CUTANEOUS | 1 refills | Status: DC

## 2018-01-20 MED FILL — MUPIROCIN 2% OINTMENT: 2 | 15 days supply | Qty: 22 | Fill #0

## 2018-01-20 MED FILL — NITROGLYCERIN 0.4 MG TAB SL: 0.4 | 7 days supply | Qty: 25 | Fill #0

## 2018-01-20 MED FILL — BUTALB-ACETAMIN-CAFF 50-325: 50-325-40 | 90 days supply | Qty: 180 | Fill #1

## 2018-01-20 NOTE — Addendum Note (Signed)
Addended by: Willeen Cass A on: 01/20/2018 11:51 AM   Modules accepted: Orders

## 2018-01-20 NOTE — Progress Notes (Signed)
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 He is a pleasant 83 yo man with a h/o persistent atrial fib/flutter, AS, CAD, S./P AVR, who was placed on dofetilide and maintained NSR but developed recurrent atypical atrial flutter and presents for additional evaluation. He denies palpitations but has noted some worsening dyspnea and claudication type symptoms.   Allergies  Allergen Reactions  . Contrast Media [Iodinated Diagnostic Agents] Hives  . Gadolinium Derivatives Hives  . Metrizamide Hives     Current Outpatient Medications  Medication Sig Dispense Refill  . amoxicillin-clavulanate (AUGMENTIN) 875-125 MG tablet TAKE 1 TABLET BY MOUTH TWICE DAILY 20 tablet 1  . bisacodyl (DULCOLAX) 5 MG EC tablet Take 5 mg by mouth every other day.     . butalbital-acetaminophen-caffeine (FIORICET, ESGIC) 50-325-40 MG per tablet Take 1 tablet by mouth 2 (two) times daily as needed (osteo-arthritis pain).     . Cholecalciferol (VITAMIN D3) 2000 units capsule Take 1,000 Units by mouth daily.     . Diclofenac Sodium 1.5 % SOLN Take 1 application by mouth 2 (two) times daily as needed (Knee pain).     Marland Kitchen doxazosin (CARDURA) 4 MG tablet Take 4 mg by mouth at bedtime.     Marland Kitchen ELIQUIS 5 MG TABS tablet TAKE 1 TABLET BY MOUTH 2 TIMES DAILY. 180 tablet 3  . HYDROcodone-acetaminophen (NORCO) 10-325 MG tablet Take 1 tablet by mouth 2 (two) times daily as needed for moderate pain. 100 tablet 0  . HYDROcodone-acetaminophen (NORCO) 10-325 MG tablet TAKE 1 TABLET BY MOUTH TWICE DAILY AS NEEDED FOR MODERATE PAIN 60 tablet 0  . HYDROcodone-acetaminophen (NORCO) 10-325 MG tablet TAKE 1 TABLET BY MOUTH TWICE DAILY AS NEEDED FOR MODERATE PAIN 100 tablet 0  . LORazepam (ATIVAN) 1 MG tablet Take 1 mg by mouth at bedtime.     . magnesium oxide (MAG-OX) 400 MG tablet Take 400 mg by mouth daily.    . metoprolol succinate (TOPROL-XL) 100 MG 24 hr tablet Take 100 mg by mouth in the morning and 100 mg by mouth at night. Take  with or immediately following a meal. 180 tablet 2  . nitroGLYCERIN (NITROSTAT) 0.4 MG SL tablet Place 1 tablet (0.4 mg total) under the tongue every 5 (five) minutes as needed for chest pain. 25 tablet 3  . OVER THE COUNTER MEDICATION Apply 1 patch topically daily as needed (neck and shoulder pain). Chinese pain patch    . potassium chloride SA (K-DUR,KLOR-CON) 20 MEQ tablet Take 2 tablets (40 mEq total) by mouth daily. 60 tablet 6  . predniSONE (DELTASONE) 10 MG tablet TAKE 1 TABLET BY MOUTH ONCE DAILY WITH BREAKFAST (Patient taking differently: Take 5 mg by mouth daily with breakfast. ) 30 tablet 5  . rosuvastatin (CRESTOR) 20 MG tablet TAKE 1 TABLET BY MOUTH AT BEDTIME. 90 tablet 2   No current facility-administered medications for this visit.      Past Medical History:  Diagnosis Date  . Aortic stenosis    moderate aortic stenosis  . Arthritis   . Benign prostatic hypertrophy   . 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  . Coronary artery disease    status post multiple prior percutaneous coronary interventions, microvascular angina per Dr Olevia Perches  . Heart murmur   . Hypercoagulable state ([place])    chronically anticoagulated with coumadin  . Hyperlipidemia   .  Hyperthyroidism   . Hypothyroidism    Dr. Elyse Hsu  . MGUS (monoclonal gammopathy of unknown significance) 02/17/2013  . Paroxysmal atrial fibrillation ([place])    DR. Lia Foyer,   . Prediabetes 09/21/2017  . Stroke ([place])    1990    ROS:   All systems reviewed and negative except as noted in the HPI.   Past Surgical History:  Procedure Laterality Date  . AORTIC VALVE REPLACEMENT  03/15/2011   Procedure: AORTIC VALVE REPLACEMENT (AVR);  Surgeon: Gaye Pollack, MD;  Location: [place];  Service: Open Heart Surgery;  Laterality: N/A;  . APPENDECTOMY    . CARDIAC  CATHETERIZATION     11  . CARDIOVERSION    . CARDIOVERSION  04/15/2011   Procedure: CARDIOVERSION;  Surgeon: Loralie Champagne, MD;  Location: [place];  Service: Cardiovascular;  Laterality: N/A;  . CARDIOVERSION N/A 09/11/2014   Procedure: CARDIOVERSION;  Surgeon: Sueanne Margarita, MD;  Location: [facility] ENDOSCOPY;  Service: Cardiovascular;  Laterality: N/A;  . CARDIOVERSION N/A 06/27/2015   Procedure: CARDIOVERSION;  Surgeon: Thayer Headings, MD;  Location: [facility] ENDOSCOPY;  Service: Cardiovascular;  Laterality: N/A;  . CARDIOVERSION N/A 07/04/2015   Procedure: CARDIOVERSION;  Surgeon: Evans Lance, MD;  Location: [place];  Service: Cardiovascular;  Laterality: N/A;  . CARDIOVERSION N/A 04/13/2017   Procedure: CARDIOVERSION;  Surgeon: Sueanne Margarita, MD;  Location: [facility] ENDOSCOPY;  Service: Cardiovascular;  Laterality: N/A;  . EP IMPLANTABLE DEVICE N/A 06/16/2015   Procedure: Pacemaker Implant;  Surgeon: Evans Lance, MD;  Location: [place] CV LAB;  Service: Cardiovascular;  Laterality: N/A;  . hemrrhoidectomy    . LEFT AND RIGHT HEART CATHETERIZATION WITH CORONARY ANGIOGRAM Bilateral 02/01/2011   Procedure: LEFT AND RIGHT HEART CATHETERIZATION WITH CORONARY ANGIOGRAM;  Surgeon: Hillary Bow, MD;  Location: [facility] CATH LAB;  Service: Cardiovascular;  Laterality: Bilateral;  . MAZE  03/15/2011   Procedure: MAZE;  Surgeon: Gaye Pollack, MD;  Location: [place];  Service: Open Heart Surgery;  Laterality: N/A;  . PACEMAKER INSERTION  1991   Guidant PPM, most recent Generator Change by Dr Olevia Perches was 08/22/06  . RIGHT/LEFT HEART CATH AND CORONARY ANGIOGRAPHY N/A 07/06/2016   Procedure: Right/Left Heart Cath and Coronary Angiography;  Surgeon: Sherren Mocha, MD;  Location: [place] CV LAB;  Service: Cardiovascular;  Laterality: N/A;  . TEE WITHOUT CARDIOVERSION  04/15/2011   Procedure: TRANSESOPHAGEAL ECHOCARDIOGRAM (TEE);  Surgeon: Loralie Champagne, MD;  Location: [place];  Service: Cardiovascular;   Laterality: N/A;  . TEE WITHOUT CARDIOVERSION N/A 09/11/2014   Procedure: TRANSESOPHAGEAL ECHOCARDIOGRAM (TEE);  Surgeon: Sueanne Margarita, MD;  Location: [facility] ENDOSCOPY;  Service: Cardiovascular;  Laterality: N/A;     Family History  Problem Relation Age of Onset  . Heart disease Brother        Twin brother has coronary disease and recent AVR for AS  . Anesthesia problems Neg Hx   . Hypotension Neg Hx   . Malignant hyperthermia Neg Hx   . Pseudochol deficiency Neg Hx      Social History   Socioeconomic History  . Marital status: Married    Spouse name: Not on file  . Number of children: Not on file  . Years of education: Not on file  . Highest education level: Not on file  Occupational History  . Not on file  Social Needs  . Financial resource strain: Not on file  . Food insecurity:    Worry: Not on file  Inability: Not on file  . Transportation needs:    Medical: Not on file    Non-medical: Not on file  Tobacco Use  . Smoking status: Never Smoker  . Smokeless tobacco: Never Used  Substance and Sexual Activity  . Alcohol use: No    Alcohol/week: 0.0 standard drinks  . Drug use: No  . Sexual activity: Not on file  Lifestyle  . Physical activity:    Days per week: Not on file    Minutes per session: Not on file  . Stress: Not on file  Relationships  . Social connections:    Talks on phone: Not on file    Gets together: Not on file    Attends religious service: Not on file    Active member of club or organization: Not on file    Attends meetings of clubs or organizations: Not on file    Relationship status: Not on file  . Intimate partner violence:    Fear of current or ex partner: Not on file    Emotionally abused: Not on file    Physically abused: Not on file    Forced sexual activity: Not on file  Other Topics Concern  . Not on file  Social History Narrative   Married. Has grown children   Retired Horticulturist, commercial MD        BP 124/74   Pulse 64   Ht 6'  1" (1.854 m)   Wt 222 lb (100.7 kg)   BMI 29.29 kg/m   Physical Exam:  Well appearing NAD HEENT: Unremarkable Neck:  No JVD, no thyromegally Lymphatics:  No adenopathy Back:  No CVA tenderness Lungs:  Clear with no wheezes HEART:  IRegular rate rhythm, no murmurs, no rubs, no clicks Abd:  soft, positive bowel sounds, no organomegally, no rebound, no guarding Ext:  2 plus pulses, no edema, no cyanosis, no clubbing Skin:  No rashes no nodules Neuro:  CN II through XII intact, motor grossly intact  EKG - atrial flutter, atypical with a controlled VR  DEVICE  Normal device function.  See PaceArt for details.   Assess/Plan: 1. Sinus node dysfunction - he has been in atypical atrial flutter. 2. PPM - his [place] Sci demonstrates that his VR has been fairly well controlled.  3. Worsening dyspnea - this is likely multifactorial but I have recommended a 2D echo 4. CAD - he has had some anginal symptoms with exertion. I will give him a new script for slntg  Mikle Bosworth.D.

## 2018-01-20 NOTE — Patient Instructions (Addendum)
Medication Instructions:  Your physician recommends that you continue on your current medications as directed. Please refer to the Current Medication list given to you today.  Labwork: None ordered.  Testing/Procedures: Your physician has requested that you have an echocardiogram. Echocardiography is a painless test that uses sound waves to create images of your heart. It provides your doctor with information about the size and shape of your heart and how well your heart's chambers and valves are working. This procedure takes approximately one hour. There are no restrictions for this procedure.  Please schedule for an ECHO   Your physician has requested that you have an ankle brachial index (ABI). During this test an ultrasound and blood pressure cuff are used to evaluate the arteries that supply the arms and legs with blood. Allow thirty minutes for this exam. There are no restrictions or special instructions.  Please schedule for bilateral ABI's  Follow-Up: Your physician wants you to follow-up in: 6 months with Dr. Lovena Le.   You will receive a reminder letter in the mail two months in advance. If you don't receive a letter, please call our office to schedule the follow-up appointment.   Any Other Special Instructions Will Be Listed Below (If Applicable).  If you need a refill on your cardiac medications before your next appointment, please call your pharmacy.

## 2018-01-23 NOTE — Addendum Note (Signed)
Addended by: Paulla Dolly on: 01/23/2018 08:39 AM   Modules accepted: Orders

## 2018-01-30 ENCOUNTER — Other Ambulatory Visit: Payer: Self-pay | Admitting: Internal Medicine

## 2018-01-30 DIAGNOSIS — I739 Peripheral vascular disease, unspecified: Secondary | ICD-10-CM

## 2018-02-01 ENCOUNTER — Ambulatory Visit: Payer: Medicare Other | Attending: Rheumatology

## 2018-02-01 ENCOUNTER — Telehealth: Payer: Self-pay

## 2018-02-01 DIAGNOSIS — M25511 Pain in right shoulder: Secondary | ICD-10-CM | POA: Insufficient documentation

## 2018-02-01 DIAGNOSIS — M25551 Pain in right hip: Secondary | ICD-10-CM | POA: Insufficient documentation

## 2018-02-01 DIAGNOSIS — G8929 Other chronic pain: Secondary | ICD-10-CM | POA: Insufficient documentation

## 2018-02-01 DIAGNOSIS — M25512 Pain in left shoulder: Secondary | ICD-10-CM | POA: Insufficient documentation

## 2018-02-01 NOTE — Telephone Encounter (Signed)
PT called pt due to missed appointment.  Pt reported that he didn't know he had an appointment and didn't get a reminder call.  Pt asked to be called tomorrow to see if he can get another appointment early next week as he was driving.

## 2018-02-02 ENCOUNTER — Other Ambulatory Visit: Payer: Self-pay

## 2018-02-02 ENCOUNTER — Ambulatory Visit (HOSPITAL_BASED_OUTPATIENT_CLINIC_OR_DEPARTMENT_OTHER): Payer: Medicare Other

## 2018-02-02 ENCOUNTER — Ambulatory Visit (HOSPITAL_COMMUNITY)
Admission: RE | Admit: 2018-02-02 | Discharge: 2018-02-02 | Disposition: A | Discharge status: Home / Self Care | Payer: Medicare Other | Source: Ambulatory Visit | Attending: Cardiovascular Disease | Admitting: Cardiovascular Disease

## 2018-02-02 DIAGNOSIS — R0602 Shortness of breath: Secondary | ICD-10-CM | POA: Diagnosis not present

## 2018-02-02 DIAGNOSIS — I739 Peripheral vascular disease, unspecified: Secondary | ICD-10-CM | POA: Diagnosis not present

## 2018-02-08 ENCOUNTER — Encounter: Payer: Medicare Other | Admitting: Physical Therapy

## 2018-02-10 ENCOUNTER — Encounter: Payer: Self-pay | Admitting: Cardiovascular Disease

## 2018-02-10 ENCOUNTER — Ambulatory Visit (INDEPENDENT_AMBULATORY_CARE_PROVIDER_SITE_OTHER): Payer: Medicare Other | Admitting: Cardiovascular Disease

## 2018-02-10 VITALS — BP 126/78 | HR 75 | Ht 73.0 in | Wt 220.0 lb

## 2018-02-10 DIAGNOSIS — I484 Atypical atrial flutter: Secondary | ICD-10-CM | POA: Diagnosis not present

## 2018-02-10 DIAGNOSIS — I2510 Atherosclerotic heart disease of native coronary artery without angina pectoris: Secondary | ICD-10-CM | POA: Diagnosis not present

## 2018-02-10 DIAGNOSIS — I5032 Chronic diastolic (congestive) heart failure: Secondary | ICD-10-CM | POA: Diagnosis not present

## 2018-02-10 DIAGNOSIS — I4819 Other persistent atrial fibrillation: Secondary | ICD-10-CM

## 2018-02-10 DIAGNOSIS — I359 Nonrheumatic aortic valve disorder, unspecified: Secondary | ICD-10-CM | POA: Diagnosis not present

## 2018-02-10 NOTE — Progress Notes (Signed)
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  The patient is here alone today.  He continues to struggle with exertional dyspnea.  He denies orthopnea, PND, or leg swelling.  He denies chest pain or pressure.  He has had no recent problems with lightheadedness or syncope.  He had no real improvement in the past with furosemide.  He has been noted to be in a left-sided atrial flutter with recommendations for ongoing rate control and anticoagulation.  Past Medical History:  Diagnosis Date  . Aortic stenosis    moderate aortic stenosis  . Arthritis   . Benign prostatic hypertrophy   . 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  . Coronary artery disease    status post multiple prior percutaneous coronary interventions, microvascular angina per Dr Olevia Perches  . Heart murmur   . Hypercoagulable state ([place])    chronically anticoagulated with coumadin  . Hyperlipidemia   . Hyperthyroidism   . Hypothyroidism    Dr. Elyse Hsu  . MGUS (monoclonal gammopathy of unknown significance) 02/17/2013  . Paroxysmal atrial fibrillation ([place])    DR. Lia Foyer,   . Prediabetes 09/21/2017  . Stroke [facility])    1990    Past Surgical History:  Procedure Laterality Date  . AORTIC VALVE  REPLACEMENT  03/15/2011   Procedure: AORTIC VALVE REPLACEMENT (AVR);  Surgeon: Gaye Pollack, MD;  Location: [place];  Service: Open Heart Surgery;  Laterality: N/A;  . APPENDECTOMY    . CARDIAC CATHETERIZATION     13  . CARDIOVERSION    . CARDIOVERSION  04/15/2011   Procedure: CARDIOVERSION;  Surgeon: Loralie Champagne, MD;  Location: [place];  Service: Cardiovascular;  Laterality: N/A;  . CARDIOVERSION N/A 09/11/2014   Procedure: CARDIOVERSION;  Surgeon: Sueanne Margarita, MD;  Location: [facility] ENDOSCOPY;  Service: Cardiovascular;  Laterality: N/A;  . CARDIOVERSION N/A 06/27/2015   Procedure: CARDIOVERSION;  Surgeon: Thayer Headings, MD;  Location: [facility] ENDOSCOPY;  Service: Cardiovascular;  Laterality: N/A;  . CARDIOVERSION N/A 07/04/2015   Procedure: CARDIOVERSION;  Surgeon: Evans Lance, MD;  Location: [place];  Service: Cardiovascular;  Laterality: N/A;  . CARDIOVERSION N/A 04/13/2017   Procedure: CARDIOVERSION;  Surgeon: Sueanne Margarita, MD;  Location: [facility] ENDOSCOPY;  Service: Cardiovascular;  Laterality: N/A;  . EP IMPLANTABLE DEVICE N/A 06/16/2015   Procedure: Pacemaker Implant;  Surgeon: Evans Lance, MD;  Location: [place] CV LAB;  Service: Cardiovascular;  Laterality: N/A;  . hemrrhoidectomy    . LEFT AND RIGHT HEART CATHETERIZATION WITH CORONARY ANGIOGRAM Bilateral 02/01/2011   Procedure: LEFT AND RIGHT HEART CATHETERIZATION WITH CORONARY ANGIOGRAM;  Surgeon: Hillary Bow, MD;  Location: [facility] CATH LAB;  Service: Cardiovascular;  Laterality: Bilateral;  . MAZE  03/15/2011   Procedure: MAZE;  Surgeon: Gaye Pollack, MD;  Location:  [place] OR;  Service: Open Heart Surgery;  Laterality: N/A;  . PACEMAKER INSERTION  1991   Guidant PPM, most recent Generator Change by Dr Olevia Perches was 08/22/06  . RIGHT/LEFT HEART CATH AND CORONARY ANGIOGRAPHY N/A 07/06/2016   Procedure: Right/Left Heart Cath and Coronary Angiography;  Surgeon: Sherren Mocha, MD;  Location: [place] CV LAB;  Service:  Cardiovascular;  Laterality: N/A;  . TEE WITHOUT CARDIOVERSION  04/15/2011   Procedure: TRANSESOPHAGEAL ECHOCARDIOGRAM (TEE);  Surgeon: Loralie Champagne, MD;  Location: [place];  Service: Cardiovascular;  Laterality: N/A;  . TEE WITHOUT CARDIOVERSION N/A 09/11/2014   Procedure: TRANSESOPHAGEAL ECHOCARDIOGRAM (TEE);  Surgeon: Sueanne Margarita, MD;  Location: [facility] ENDOSCOPY;  Service: Cardiovascular;  Laterality: N/A;    Current Medications: Current Meds  Medication Sig  . bisacodyl (DULCOLAX) 5 MG EC tablet Take 5 mg by mouth as needed (constipation).   . butalbital-acetaminophen-caffeine (FIORICET, ESGIC) 50-325-40 MG per tablet Take 1 tablet by mouth 2 (two) times daily as needed (osteo-arthritis pain).   . Cholecalciferol (VITAMIN D3) 2000 units capsule Take 1,000 Units by mouth daily.   . Diclofenac Sodium 1.5 % SOLN Take 1 application by mouth 2 (two) times daily as needed (Knee pain).   Marland Kitchen doxazosin (CARDURA) 4 MG tablet Take 4 mg by mouth at bedtime.   Marland Kitchen ELIQUIS 5 MG TABS tablet TAKE 1 TABLET BY MOUTH 2 TIMES DAILY.  Marland Kitchen HYDROcodone-acetaminophen (NORCO) 10-325 MG tablet Take 1 tablet by mouth 2 (two) times daily as needed for moderate pain.  Marland Kitchen LORazepam (ATIVAN) 1 MG tablet Take 1 mg by mouth at bedtime.   . magnesium oxide (MAG-OX) 400 MG tablet Take 400 mg by mouth daily.  . Metoprolol Succinate 100 MG CS24 Take 1 tablet by mouth 2 (two) times daily.  . nitroGLYCERIN (NITROSTAT) 0.4 MG SL tablet Place 1 tablet (0.4 mg total) under the tongue every 5 (five) minutes as needed for chest pain.  Marland Kitchen OVER THE COUNTER MEDICATION Apply 1 patch topically daily as needed (neck and shoulder pain). Chinese pain patch  . potassium chloride SA (K-DUR,KLOR-CON) 20 MEQ tablet Take 2 tablets (40 mEq total) by mouth daily.  . predniSONE (DELTASONE) 10 MG tablet TAKE 1 TABLET BY MOUTH ONCE DAILY WITH BREAKFAST  . rosuvastatin (CRESTOR) 20 MG tablet TAKE 1 TABLET BY MOUTH AT BEDTIME.     Allergies:   Contrast  media [iodinated diagnostic agents]; Gadolinium derivatives; and Metrizamide   Social History   Socioeconomic History  . Marital status: Married    Spouse name: Not on file  . Number of children: Not on file  . Years of education: Not on file  . Highest education level: Not on file  Occupational History  . Not on file  Social Needs  . Financial resource strain: Not on file  . Food insecurity:    Worry: Not on file    Inability: Not on file  . Transportation needs:    Medical: Not on file    Non-medical: Not on file  Tobacco Use  . Smoking status: Never Smoker  . Smokeless tobacco: Never Used  Substance and Sexual Activity  . Alcohol use: No    Alcohol/week: 0.0 standard drinks  . Drug use: No  . Sexual activity: Not on file  Lifestyle  . Physical activity:    Days per week: Not on file    Minutes per session: Not on file  . Stress: Not on file  Relationships  . Social connections:    Talks  on phone: Not on file    Gets together: Not on file    Attends religious service: Not on file    Active member of club or organization: Not on file    Attends meetings of clubs or organizations: Not on file    Relationship status: Not on file  Other Topics Concern  . Not on file  Social History Narrative   Married. Has grown children   Retired Horticulturist, commercial MD        Family History: The patient's family history includes Heart disease in his brother. There is no history of Anesthesia problems, Hypotension, Malignant hyperthermia, or Pseudochol deficiency.  ROS:   Please see the history of present illness.    Positive for joint pains. All other systems reviewed and are negative.  EKGs/Labs/Other Studies Reviewed:    The following studies were reviewed today: Echo 02/02/2018: Study Conclusions  - Left ventricle: The cavity size was normal. There was mild   concentric hypertrophy. Systolic function was normal. The   estimated ejection fraction was in the range of 50% to 55%.  Wall   motion was normal; there were no regional wall motion   abnormalities. - Aortic valve: A bioprosthesis was present. Valve mobility was   mildly restricted on parasternal long axis. There was very mild   stenosis consistent with prior valve gradients. There was no   regurgitation. - Aorta: Ascending aortic diameter: 40 mm (S). - Ascending aorta: The ascending aorta was mildly dilated. - Mitral valve: Calcified annulus. There was mild regurgitation. - Left atrium: The atrium was moderately dilated. - Right ventricle: The cavity size was mildly dilated. Wall   thickness was normal. Systolic function was normal. - Right atrium: The atrium was moderately dilated. - Atrial septum: No defect or patent foramen ovale was identified. - Tricuspid valve: There was mild regurgitation. Peak RV-RA   gradient (S): 30 mm Hg. - Pulmonic valve: There was mild regurgitation.  Impressions:  - Normal LV EF, bioprosthetic AVR without regurgitation, gradient   similar to prior.  EKG:  EKG is not ordered today.    Recent Labs: 04/19/2017: Magnesium 2.3 09/02/2017: ALT 19; BUN 11; Creatinine, Ser 0.88; Hemoglobin 12.0; Platelets 213; Potassium 4.8; Sodium 141; TSH 2.750  Recent Lipid Panel    Component Value Date/Time   CHOL 158 09/02/2017 1006   TRIG 92 09/02/2017 1006   HDL 72 09/02/2017 1006   CHOLHDL 2.2 09/02/2017 1006   CHOLHDL 3.4 09/10/2014 1530   VLDL 23 09/10/2014 1530   LDLCALC 68 09/02/2017 1006   LDLDIRECT 117.9 11/16/2010 1355    Physical Exam:    VS:  BP 126/78   Pulse 75   Ht 6\' 1"  (1.854 m)   Wt 220 lb (99.8 kg)   SpO2 97%   BMI 29.03 kg/m     Wt Readings from Last 3 Encounters:  02/10/18 220 lb (99.8 kg)  01/20/18 222 lb (100.7 kg)  11/16/17 222 lb (100.7 kg)     GEN: Well nourished, well developed in no acute distress HEENT: Normal NECK: No JVD; No carotid bruits LYMPHATICS: No lymphadenopathy CARDIAC: irregular, 2/6 SEM at the RUSB, rubs,  gallops RESPIRATORY:  Clear to auscultation without rales, wheezing or rhonchi  ABDOMEN: Soft, non-tender, non-distended MUSCULOSKELETAL:  No edema; No deformity  SKIN: Warm and dry NEUROLOGIC:  Alert and oriented x 3 PSYCHIATRIC:  Normal affect   ASSESSMENT:    1. Persistent atrial fibrillation   2. Chronic diastolic heart failure ([place])  3. Coronary artery disease involving native coronary artery of native heart without angina pectoris   4. Atypical atrial flutter ([place])   5. Aortic valve disease    PLAN:    In order of problems listed above:  1. The patient has a left-sided atrial flutter.  He is concerned that this may be contributing to his exercise intolerance and shortness of breath.  I spoke with Dr. Lovena Le at length about treatment options.  He feels the likelihood of a successful ablation is low considering the patient's atrial enlargement and scarred atria from previous maze surgery.  He also points out that the patient was in sinus rhythm previously and really did not appreciate any improvement in his symptoms.  He favors a continued approach of medical therapy which seems most reasonable.  He will continue on anticoagulation. 2. No evidence of volume excess on exam.  We will continue with current management. 3. Patient is stable from a coronary artery disease perspective with no anginal symptoms. 4. The patient's most recent echocardiogram is reviewed and demonstrates normal function of his aortic valve bioprosthesis.   Medication Adjustments/Labs and Tests Ordered: Current medicines are reviewed at length with the patient today.  Concerns regarding medicines are outlined above.  Orders Placed This Encounter  Procedures  . DG Chest 2 View   No orders of the defined types were placed in this encounter.   There are no Patient Instructions on file for this visit.   Signed, Sherren Mocha, MD  02/12/2018 4:23 PM    [place]

## 2018-02-12 ENCOUNTER — Encounter: Payer: Self-pay | Admitting: Cardiovascular Disease

## 2018-02-13 ENCOUNTER — Encounter: Payer: Self-pay | Admitting: Cardiovascular Disease

## 2018-02-14 ENCOUNTER — Ambulatory Visit: Payer: Medicare Other

## 2018-02-14 DIAGNOSIS — G8929 Other chronic pain: Secondary | ICD-10-CM | POA: Diagnosis not present

## 2018-02-14 DIAGNOSIS — M25511 Pain in right shoulder: Secondary | ICD-10-CM

## 2018-02-14 DIAGNOSIS — M25512 Pain in left shoulder: Principal | ICD-10-CM

## 2018-02-14 DIAGNOSIS — M25551 Pain in right hip: Secondary | ICD-10-CM

## 2018-02-14 NOTE — Patient Instructions (Signed)
Access Code: [email or web address]  URL: [email or web address]  Date: 02/14/2018  Prepared by: Sigurd Sos   Exercises Seated Hamstring Stretch - 3 reps - 20 hold - 2x daily - 7x weekly Supine Shoulder Flexion AAROM - 10 reps - 1 sets - 10 hold - 2x daily - 7x weekly Seated Shoulder Flexion AAROM with Pulley Behind - 2x daily - 7x weekly

## 2018-02-14 NOTE — Therapy (Signed)
[facility] 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 Aortic stenosis    moderate aortic stenosis  . Arthritis   . Benign prostatic hypertrophy   . 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  . Coronary artery disease    status post multiple prior percutaneous coronary interventions, microvascular angina per Dr Olevia Perches  . Heart murmur   . Hypercoagulable state ([place])    chronically anticoagulated with coumadin  . Hyperlipidemia   . Hyperthyroidism   . Hypothyroidism    Dr. Elyse Hsu  . MGUS (monoclonal gammopathy of unknown significance) 02/17/2013  . Paroxysmal atrial fibrillation ([place])    DR. Lia Foyer,   . Prediabetes 09/21/2017  . Stroke [facility])    1990    Past Surgical History:  Procedure Laterality Date  . AORTIC VALVE REPLACEMENT  03/15/2011   Procedure: AORTIC VALVE REPLACEMENT (AVR);  Surgeon: Gaye Pollack, MD;  Location: [place];  Service: Open Heart Surgery;  Laterality: N/A;  . APPENDECTOMY    . CARDIAC CATHETERIZATION     11  . CARDIOVERSION    . CARDIOVERSION  04/15/2011   Procedure:  CARDIOVERSION;  Surgeon: Loralie Champagne, MD;  Location: [place];  Service: Cardiovascular;  Laterality: N/A;  . CARDIOVERSION N/A 09/11/2014   Procedure: CARDIOVERSION;  Surgeon: Sueanne Margarita, MD;  Location: [facility] ENDOSCOPY;  Service: Cardiovascular;  Laterality: N/A;  . CARDIOVERSION N/A 06/27/2015   Procedure: CARDIOVERSION;  Surgeon: Thayer Headings, MD;  Location: [facility] ENDOSCOPY;  Service: Cardiovascular;  Laterality: N/A;  . CARDIOVERSION N/A 07/04/2015   Procedure: CARDIOVERSION;  Surgeon: Evans Lance, MD;  Location: [place];  Service: Cardiovascular;  Laterality: N/A;  . CARDIOVERSION N/A 04/13/2017   Procedure: CARDIOVERSION;  Surgeon: Sueanne Margarita, MD;  Location: [facility] ENDOSCOPY;  Service: Cardiovascular;  Laterality: N/A;  . EP IMPLANTABLE DEVICE N/A 06/16/2015   Procedure: Pacemaker Implant;  Surgeon: Evans Lance, MD;  Location: [place] CV LAB;  Service: Cardiovascular;  Laterality: N/A;  . hemrrhoidectomy    . LEFT AND RIGHT HEART CATHETERIZATION WITH CORONARY ANGIOGRAM Bilateral 02/01/2011   Procedure: LEFT AND RIGHT HEART CATHETERIZATION WITH CORONARY ANGIOGRAM;  Surgeon: Hillary Bow, MD;  Location: [facility] CATH LAB;  Service: Cardiovascular;  Laterality: Bilateral;  . MAZE  03/15/2011   Procedure: MAZE;  Surgeon: Gaye Pollack, MD;  Location: [place];  Service: Open Heart Surgery;  Laterality: N/A;  . PACEMAKER INSERTION  1991   Guidant PPM, most recent Generator Change by Dr Olevia Perches was 08/22/06  . RIGHT/LEFT HEART CATH  AND CORONARY ANGIOGRAPHY N/A 07/06/2016   Procedure: Right/Left Heart Cath and Coronary Angiography;  Surgeon: Sherren Mocha, MD;  Location: [place] CV LAB;  Service: Cardiovascular;  Laterality: N/A;  . TEE WITHOUT CARDIOVERSION  04/15/2011   Procedure: TRANSESOPHAGEAL ECHOCARDIOGRAM (TEE);  Surgeon: Loralie Champagne, MD;  Location: [place];  Service: Cardiovascular;  Laterality: N/A;  . TEE WITHOUT CARDIOVERSION N/A 09/11/2014   Procedure:  TRANSESOPHAGEAL ECHOCARDIOGRAM (TEE);  Surgeon: Sueanne Margarita, MD;  Location: [facility] ENDOSCOPY;  Service: Cardiovascular;  Laterality: N/A;    There were no vitals filed for this visit.  Subjective Assessment - 02/14/18 1247    Subjective  I feel terrible.  I have pain everywhere.      Currently in Pain?  Yes    Pain Score  10-Worst pain ever    Pain Location  Hip    Pain Orientation  Right    Pain Descriptors / Indicators  Aching    Pain Type  Chronic pain    Pain Onset  More than a month ago    Pain Frequency  Intermittent    Aggravating Factors   walking    Pain Relieving Factors  sitting, laying down    Pain Score  10    Pain Location  Shoulder    Pain Orientation  Right;Left    Pain Descriptors / Indicators  Aching;Tightness    Pain Type  Chronic pain    Pain Onset  More than a month ago    Pain Frequency  Constant    Aggravating Factors   everything, sleep at night    Pain Relieving Factors  meds                       [facility] Adult PT Treatment/Exercise - 02/14/18 0001      Exercises   Exercises  Knee/Hip;Shoulder      Knee/Hip Exercises: Stretches   Active Hamstring Stretch  Both;3 reps;20 seconds      Knee/Hip Exercises: Aerobic   Nustep  Level 1 x 5 minutes   PT present to monitor     Shoulder Exercises: Supine   Flexion  AAROM;Both;20 reps    Flexion Limitations  clasped hands      Shoulder Exercises: Pulleys   Flexion  3 minutes             PT Education - 02/14/18 1307    Education Details  Access Code: [email or web address]    Person(s) Educated  Patient    Methods  Explanation;Demonstration    Comprehension  Verbalized understanding;Returned demonstration       PT Short Term Goals - 01/16/18 2109      PT SHORT TERM GOAL #1   Title  Pt will be ind in initial HEP.    Time  4    Period  Weeks    Status  New    Target Date  02/13/18      PT SHORT TERM GOAL #2   Title  Pt will achieve bil shoulder A/ROM to at least 90 degrees to increase  functional use of bil UEs.    Time  5    Period  Weeks    Status  New    Target Date  02/20/18      PT SHORT TERM GOAL #3   Title  Pt will report reduced shoulder pain to < or = 6/10 throughout the day.    Time  5    Period  Weeks  Status  New    Target Date  02/20/18      PT SHORT TERM GOAL #4   Title  Pt will be able to tolerate walking at least 10 min without increased Rt hip pain.    Time  5    Period  Weeks    Status  New    Target Date  02/20/18        PT Long Term Goals - 01/16/18 2112      PT LONG TERM GOAL #1   Title  Pt will be ind in advanced HEP.    Time  8    Period  Weeks    Status  New    Target Date  03/13/18      PT LONG TERM GOAL #2   Title  Pt will achieve active bil shoulder flexion to at least 110 degrees to increase ability to perform overhead tasks.    Time  8    Period  Weeks    Status  New    Target Date  03/13/18      PT LONG TERM GOAL #3   Title  Pt will be able to tolerate at least 15 min of standing and/or walking activities without increased hip pain.    Time  8    Period  Weeks    Status  New    Target Date  03/13/18      PT LONG TERM GOAL #4   Title  Pt will reduce FOTO score for Rt hip to < or = 40% to demonstate reduced functional limitation.            Plan - 02/14/18 1327    Clinical Impression Statement  Pt with lapse in treatment.  Pt initiated HEP For shoulder flexibility and hip flexibility.  Pt declined ionto for Rt hip until he was able to be more consistent with PT attendance.  Pt will be going out of the country for a month.  Pt tolerated exercise well today.  Pt was encouraged to do the NuStep at [facility] for hip and shoulder mobility.  Pt will benefit from continued PT for shoulder mobility and hip flexibility.      Rehab Potential  Good    Clinical Impairments Affecting Rehab Potential  pacemaker, extensive medical history    PT Frequency  2x / week    PT Duration  8 weeks    PT Treatment/Interventions   ADLs/Self Care Home Management;Cryotherapy;Iontophoresis 4mg /ml Dexamethasone;Moist Heat;Functional mobility training;Therapeutic exercise;Therapeutic activities;Balance training;Neuromuscular re-education;Patient/family education;Manual techniques;Passive range of motion;Dry needling;Spinal Manipulations;Joint Manipulations    PT Next Visit Plan  shoulder and hip flexibility and ROM, NuStep    Recommended Other Services  initial cert is signed    Consulted and Agree with Plan of Care  Patient       Patient will benefit from skilled therapeutic intervention in order to improve the following deficits and impairments:  Decreased range of motion, Difficulty walking, Increased fascial restricitons, Impaired UE functional use, Decreased activity tolerance, Pain, Hypomobility, Impaired flexibility, Decreased mobility, Decreased strength, Postural dysfunction  Visit Diagnosis: Chronic left shoulder pain  Chronic right shoulder pain  Pain in right hip     Problem List Patient Active Problem List   Diagnosis Date Noted  . Prediabetes 09/21/2017  . Persistent atrial fibrillation 04/11/2017  . Vitamin D deficiency 05/20/2016  . History of pacemaker 05/20/2016  . History of stroke/[place]  05/20/2016  . Primary osteoarthritis of both hands 03/10/2016  . DDD  cervical spine 03/10/2016  . 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 Chronic left shoulder pain 03/10/2016  . Trochanteric bursitis of right hip 03/10/2016  . Other fatigue 03/10/2016  . Myalgia 03/10/2016  . Other chronic pain 03/10/2016  . 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 Typical atrial flutter ([place])   . PVC's (premature ventricular contractions) 01/30/2015  . Pacemaker 04/30/2013  . MGUS (monoclonal gammopathy of unknown significance) 02/17/2013  . Orthostatic hypotension 04/11/2011  . Long term current use of anticoagulant therapy 03/24/2011  . S/P AVR 03/24/2011  . Pleural effusion 03/24/2011  . Hypothyroidism  11/22/2010  . Atrial flutter ([place]) 08/19/2010  . CHEST PAIN 04/06/2010  . HYPERLIPIDEMIA-MIXED 12/08/2007  . PRIMARY HYPERCOAGULABLE STATE 12/08/2007  . Coronary artery disease with exertional angina ([place]) 12/08/2007  . CAD, NATIVE VESSEL 12/08/2007  . AORTIC STENOSIS/ INSUFFICIENCY, NON-RHEUMATIC 12/08/2007  . Atrial fibrillation ([place]) 12/08/2007  . Chronotropic incompetence with sinus node dysfunction (HCC) 12/08/2007    Sigurd Sos, PT 02/14/18 1:32 PM   [facility] [address], [place], [place], [address] Phone: [phone]   Fax:  [phone]  Name: [name], MD MRN: [id number] Date of Birth: 11/02/1934

## 2018-02-15 ENCOUNTER — Ambulatory Visit (INDEPENDENT_AMBULATORY_CARE_PROVIDER_SITE_OTHER)
Admission: RE | Admit: 2018-02-15 | Discharge: 2018-02-15 | Disposition: A | Discharge status: Home / Self Care | Payer: Medicare Other | Source: Ambulatory Visit | Attending: Cardiovascular Disease | Admitting: Cardiovascular Disease

## 2018-02-15 DIAGNOSIS — R0602 Shortness of breath: Secondary | ICD-10-CM | POA: Diagnosis not present

## 2018-02-15 DIAGNOSIS — I4819 Other persistent atrial fibrillation: Secondary | ICD-10-CM

## 2018-02-15 DIAGNOSIS — R05 Cough: Secondary | ICD-10-CM | POA: Diagnosis not present

## 2018-02-16 ENCOUNTER — Ambulatory Visit: Payer: Medicare Other

## 2018-02-20 ENCOUNTER — Ambulatory Visit: Payer: Medicare Other

## 2018-02-20 DIAGNOSIS — M25511 Pain in right shoulder: Secondary | ICD-10-CM

## 2018-02-20 DIAGNOSIS — M25512 Pain in left shoulder: Secondary | ICD-10-CM | POA: Diagnosis not present

## 2018-02-20 DIAGNOSIS — M25551 Pain in right hip: Secondary | ICD-10-CM

## 2018-02-20 DIAGNOSIS — G8929 Other chronic pain: Secondary | ICD-10-CM

## 2018-02-20 NOTE — Patient Instructions (Signed)
Access Code: [email or web address]  URL: [email or web address]  Date: 02/20/2018  Prepared by: Sigurd Sos   Exercises Supine Shoulder Horizontal Abduction with Resistance - 10 reps - 2 sets - 2x daily - 7x weekly Supine Bilateral Shoulder External Rotation with Resistance - 10 reps - 2 sets - 2x daily - 7x weekly Clamshell - 10 reps - 2 sets - 2x daily - 7x weekly Hooklying Single Knee to Chest - 3 reps - 1 sets - 20 hold - 3x daily - 7x weekly

## 2018-02-20 NOTE — Therapy (Signed)
[facility] [address], [place], [place], [address] Phone: [phone]   Fax:  [phone]  Physical Therapy Treatment  Patient Details  Name: Kevin HUNTSMAN, MD MRN: [id number] Date of Birth: 08-31-1934 Referring Provider (PT): Bo Merino, MD   Encounter Date: 02/20/2018  PT End of Session - 02/20/18 1526    Visit Number  3    PT Start Time  3094    PT Stop Time  0768    PT Time Calculation (min)  39 min    Activity Tolerance  Patient tolerated treatment well;Patient limited by pain    Behavior During Therapy  [facility] for tasks assessed/performed       Past Medical History:  Diagnosis Date  . Aortic stenosis    moderate aortic stenosis  . Arthritis   . Benign prostatic hypertrophy   . 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  . Coronary artery disease    status post multiple prior percutaneous coronary interventions, microvascular angina per Dr Olevia Perches  . Heart murmur   . Hypercoagulable state ([place])    chronically anticoagulated with coumadin  . Hyperlipidemia   . Hyperthyroidism   . Hypothyroidism    Dr. Elyse Hsu  . MGUS (monoclonal gammopathy of unknown significance) 02/17/2013  . Paroxysmal atrial fibrillation ([place])    DR. Lia Foyer,   . Prediabetes 09/21/2017  . Stroke [facility])    1990    Past Surgical History:  Procedure Laterality Date  . AORTIC VALVE REPLACEMENT  03/15/2011   Procedure: AORTIC VALVE REPLACEMENT (AVR);  Surgeon: Gaye Pollack, MD;  Location: [place];  Service: Open Heart Surgery;  Laterality: N/A;  . APPENDECTOMY    . CARDIAC CATHETERIZATION     11  . CARDIOVERSION    . CARDIOVERSION  04/15/2011   Procedure: CARDIOVERSION;  Surgeon: Loralie Champagne, MD;  Location: [place];  Service: Cardiovascular;  Laterality: N/A;  .  CARDIOVERSION N/A 09/11/2014   Procedure: CARDIOVERSION;  Surgeon: Sueanne Margarita, MD;  Location: [facility] ENDOSCOPY;  Service: Cardiovascular;  Laterality: N/A;  . CARDIOVERSION N/A 06/27/2015   Procedure: CARDIOVERSION;  Surgeon: Thayer Headings, MD;  Location: [facility] ENDOSCOPY;  Service: Cardiovascular;  Laterality: N/A;  . CARDIOVERSION N/A 07/04/2015   Procedure: CARDIOVERSION;  Surgeon: Evans Lance, MD;  Location: [place];  Service: Cardiovascular;  Laterality: N/A;  . CARDIOVERSION N/A 04/13/2017   Procedure: CARDIOVERSION;  Surgeon: Sueanne Margarita, MD;  Location: [facility] ENDOSCOPY;  Service: Cardiovascular;  Laterality: N/A;  . EP IMPLANTABLE DEVICE N/A 06/16/2015   Procedure: Pacemaker Implant;  Surgeon: Evans Lance, MD;  Location: [place] CV LAB;  Service: Cardiovascular;  Laterality: N/A;  . hemrrhoidectomy    . LEFT AND RIGHT HEART CATHETERIZATION WITH CORONARY ANGIOGRAM Bilateral 02/01/2011   Procedure: LEFT AND RIGHT HEART CATHETERIZATION WITH CORONARY ANGIOGRAM;  Surgeon: Hillary Bow, MD;  Location: [facility] CATH LAB;  Service: Cardiovascular;  Laterality: Bilateral;  . MAZE  03/15/2011   Procedure: MAZE;  Surgeon: Gaye Pollack, MD;  Location: [place];  Service: Open Heart Surgery;  Laterality: N/A;  . PACEMAKER INSERTION  1991   Guidant PPM, most recent Generator Change by Dr Olevia Perches was 08/22/06  . RIGHT/LEFT HEART CATH AND CORONARY ANGIOGRAPHY N/A 07/06/2016   Procedure: Right/Left Heart Cath and Coronary Angiography;  Surgeon: Sherren Mocha, MD;  Location: [facility]  INVASIVE CV LAB;  Service: Cardiovascular;  Laterality: N/A;  . TEE WITHOUT CARDIOVERSION  04/15/2011   Procedure: TRANSESOPHAGEAL ECHOCARDIOGRAM (TEE);  Surgeon: Loralie Champagne, MD;  Location: [place];  Service: Cardiovascular;  Laterality: N/A;  . TEE WITHOUT CARDIOVERSION N/A 09/11/2014   Procedure: TRANSESOPHAGEAL ECHOCARDIOGRAM (TEE);  Surgeon: Sueanne Margarita, MD;  Location: [facility] ENDOSCOPY;  Service: Cardiovascular;   Laterality: N/A;    There were no vitals filed for this visit.  Subjective Assessment - 02/20/18 1451    Subjective  [place] am leaving for [place] and won't be back for a month.  I have pulleys at [facility] and a NuStep    Diagnostic tests  Pt cannot get MRIs due to pacemaker, xrays    Patient Stated Goals  reduce pain    Currently in Pain?  Yes    Pain Score  5     Pain Location  Hip    Pain Orientation  Right    Pain Descriptors / Indicators  Aching    Pain Type  Chronic pain    Pain Onset  More than a month ago    Pain Frequency  Intermittent    Aggravating Factors   walking    Pain Relieving Factors  sitting, lying down    Pain Score  5    Pain Location  Shoulder    Pain Orientation  Left;Right    Pain Descriptors / Indicators  Aching;Tightness         [facility] PT Assessment - 02/20/18 0001      Assessment   Medical Diagnosis  M70.61 (ICD-10-CM) - Trochanteric bursitis, right hip, M79.18 (ICD-10-CM) - Myofascial pain      Cognition   Overall Cognitive Status  Within Functional Limits for tasks assessed                   [facility] Adult PT Treatment/Exercise - 02/20/18 0001      Knee/Hip Exercises: Stretches   Active Hamstring Stretch  Both;3 reps;20 seconds    Other Knee/Hip Stretches  knee to chest 3x20 seconds      Knee/Hip Exercises: Aerobic   Nustep  Level 6 x 6 minutes   PT present to monitor     Shoulder Exercises: Supine   Horizontal ABduction  Strengthening;Both;20 reps;Theraband    External Rotation  Strengthening;Both;Theraband;20 reps    Theraband Level (Shoulder External Rotation)  Level 1 (Yellow)    Flexion  AAROM;Both;20 reps    Flexion Limitations  clasped hands      Shoulder Exercises: Pulleys   Flexion  3 minutes             PT Education - 02/20/18 1524    Education Details  Access Code: [email or web address]    Person(s) Educated  Patient    Methods  Explanation;Demonstration    Comprehension  Verbalized understanding;Returned  demonstration       PT Short Term Goals - 01/16/18 2109      PT SHORT TERM GOAL #1   Title  Pt will be ind in initial HEP.    Time  4    Period  Weeks    Status  New    Target Date  02/13/18      PT SHORT TERM GOAL #2   Title  Pt will achieve bil shoulder A/ROM to at least 90 degrees to increase functional use of bil UEs.    Time  5    Period  Weeks    Status  New  Target Date  02/20/18      PT SHORT TERM GOAL #3   Title  Pt will report reduced shoulder pain to < or = 6/10 throughout the day.    Time  5    Period  Weeks    Status  New    Target Date  02/20/18      PT SHORT TERM GOAL #4   Title  Pt will be able to tolerate walking at least 10 min without increased Rt hip pain.    Time  5    Period  Weeks    Status  New    Target Date  02/20/18        PT Long Term Goals - 02/20/18 1525      PT LONG TERM GOAL #1   Title  Pt will be ind in advanced HEP.    Status  Partially Met      PT LONG TERM GOAL #2   Title  Pt will achieve active bil shoulder flexion to at least 110 degrees to increase ability to perform overhead tasks.    Status  Not Met      PT LONG TERM GOAL #3   Title  Pt will be able to tolerate at least 15 min of standing and/or walking activities without increased hip pain.    Status  Not Met      PT LONG TERM GOAL #4   Title  Pt will reduce FOTO score for Rt hip to < or = 40% to demonstate reduced functional limitation.    Status  Not Met            Plan - 02/20/18 1523    Clinical Impression Statement  Pt will be leaving the country for a month so he will discharge to HEP.  Pt will D/C to HEP for shoulder and hip flexibility and strength.  Pt will follow-up with MD when he returns if he wants to return to PT.  Pt required minor tactile and demo cues for technique with new exercises.  No goals met due to limited attendance with PT.      PT Next Visit Plan  D/C PT to HEP    Consulted and Agree with Plan of Care  Patient       Patient will  benefit from skilled therapeutic intervention in order to improve the following deficits and impairments:     Visit Diagnosis: Chronic left shoulder pain  Chronic right shoulder pain  Pain in right hip     Problem List Patient Active Problem List   Diagnosis Date Noted  . Prediabetes 09/21/2017  . Persistent atrial fibrillation 04/11/2017  . Vitamin D deficiency 05/20/2016  . History of pacemaker 05/20/2016  . History of stroke/[place]  05/20/2016  . Primary osteoarthritis of both hands 03/10/2016  . DDD cervical spine 03/10/2016  . 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 Chronic left shoulder pain 03/10/2016  . Trochanteric bursitis of right hip 03/10/2016  . Other fatigue 03/10/2016  . Myalgia 03/10/2016  . Other chronic pain 03/10/2016  . 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 Typical atrial flutter ([place])   . PVC's (premature ventricular contractions) 01/30/2015  . Pacemaker 04/30/2013  . MGUS (monoclonal gammopathy of unknown significance) 02/17/2013  . Orthostatic hypotension 04/11/2011  . Long term current use of anticoagulant therapy 03/24/2011  . S/P AVR 03/24/2011  . Pleural effusion 03/24/2011  . Hypothyroidism 11/22/2010  . Atrial flutter ([place]) 08/19/2010  . CHEST PAIN 04/06/2010  .  HYPERLIPIDEMIA-MIXED 12/08/2007  . PRIMARY HYPERCOAGULABLE STATE 12/08/2007  . Coronary artery disease with exertional angina ([place]) 12/08/2007  . CAD, NATIVE VESSEL 12/08/2007  . AORTIC STENOSIS/ INSUFFICIENCY, NON-RHEUMATIC 12/08/2007  . Atrial fibrillation ([place]) 12/08/2007  . Chronotropic incompetence with sinus node dysfunction (HCC) 12/08/2007  PHYSICAL THERAPY DISCHARGE SUMMARY  Visits from Start of Care: 3  Current functional level related to goals / functional outcomes: Pt discharged as he is going out of the country x 1 month.  See above for most current status.  PT established a HEP for hip and shoulder strength and flexibility.  Pt will follow-up MD when he  returns.     Remaining deficits: Chronic shoulder and hip pain.     Education / Equipment: HEP Plan: Patient agrees to discharge.  Patient goals were not met. Patient is being discharged due to a change in medical status.  ?????leaving the country x 1 month.           Sigurd Sos, PT 02/20/18 3:30 PM  [facility] [address] [place], [place], [address] Phone: [phone]   Fax:  [phone]  Name: [name], MD MRN: [id number] Date of Birth: 07/28/1934

## 2018-02-23 ENCOUNTER — Other Ambulatory Visit: Payer: Self-pay | Admitting: Physician Assistant

## 2018-02-23 MED FILL — POTASSIUM CHLORIDE CRYS ER: 20 | 30 days supply | Qty: 60 | Fill #6

## 2018-02-23 MED FILL — ROSUVASTATIN CALCIUM 20 MG: 20 | 90 days supply | Qty: 90 | Fill #2 | Status: TO

## 2018-02-23 MED FILL — HYDROCODON-APAP 10-325: 10-325 | 30 days supply | Qty: 60 | Fill #0

## 2018-02-23 MED FILL — MUPIROCIN 2% OINTMENT: 2 | 15 days supply | Qty: 22 | Fill #1

## 2018-02-23 MED FILL — AMOX-CLAV 875-125 MG TABLET: 875-125 | 7 days supply | Qty: 14 | Fill #1

## 2018-02-23 MED FILL — NITROGLYCERIN 0.4 MG TAB SL: 0.4 | 7 days supply | Qty: 25 | Fill #1

## 2018-02-23 MED FILL — DOXAZOSIN MESYLATE 4 MG TAB: 4 | 90 days supply | Qty: 90 | Fill #3

## 2018-02-23 MED FILL — ELIQUIS 5 MG TABLET: 5 | 90 days supply | Qty: 180 | Fill #1

## 2018-02-23 NOTE — Telephone Encounter (Signed)
Last Visit: 11/16/17 Next Visit due 04/2018 UDS: 11/16/17 Narc Agreement: 11/16/17  Okay to refill Hydrocodone? 

## 2018-02-27 ENCOUNTER — Ambulatory Visit: Payer: Medicare Other

## 2018-03-06 ENCOUNTER — Encounter: Payer: Medicare Other | Admitting: Physical Therapy

## 2018-03-06 MED FILL — LORazepam 1 MG TABS: 1 | 60 days supply | Qty: 60 | Fill #1

## 2018-03-25 LAB — CUP PACEART INCLINIC DEVICE CHECK
Brady Statistic RA Percent Paced: 1 % — CL
Brady Statistic RV Percent Paced: 35 %
Date Time Interrogation Session: 20200125121028
Implantable Lead Implant Date: 20170417
Implantable Lead Location: 753859
Implantable Lead Location: 753860
Implantable Lead Model: 7740
Implantable Lead Model: 7741
Implantable Lead Serial Number: 662696
Implantable Pulse Generator Implant Date: 20170417
Lead Channel Impedance Value: 655 Ohm
Lead Channel Pacing Threshold Amplitude: 0.5 V
Lead Channel Pacing Threshold Pulse Width: 0.4 ms
Lead Channel Sensing Intrinsic Amplitude: 15.6 mV
Lead Channel Sensing Intrinsic Amplitude: 2.4 mV
Lead Channel Setting Pacing Amplitude: 2.5 V
Lead Channel Setting Pacing Amplitude: 2.5 V
Lead Channel Setting Pacing Pulse Width: 0.4 ms
MDC IDC LEAD IMPLANT DT: 20170417
MDC IDC LEAD SERIAL: 751382
MDC IDC MSMT LEADCHNL RV IMPEDANCE VALUE: 749 Ohm
MDC IDC SET LEADCHNL RV SENSING SENSITIVITY: 2.5 mV
Pulse Gen Serial Number: 718418

## 2018-03-28 ENCOUNTER — Other Ambulatory Visit: Payer: Self-pay | Admitting: Physician Assistant

## 2018-03-28 ENCOUNTER — Other Ambulatory Visit (HOSPITAL_COMMUNITY): Payer: Self-pay | Admitting: Physician Assistant

## 2018-03-28 MED FILL — POTASSIUM CHLORIDE CRYS ER: 20 | 30 days supply | Qty: 60 | Fill #0

## 2018-03-28 MED FILL — HYDROCODON-APAP 10-325: 10-325 | 30 days supply | Qty: 60 | Fill #0

## 2018-03-28 NOTE — Telephone Encounter (Signed)
Last Visit: 11/16/17 Next Visit due 04/2018 UDS: 11/16/17 Narc Agreement: 11/16/17  Last Fill 02/23/18  Okay to refill Hydrocodone?

## 2018-04-05 MED FILL — BUTALB-ACETAMIN-CAFF 50-325: 50-325-40 | 90 days supply | Qty: 180 | Fill #2

## 2018-04-28 MED FILL — METOPROLOL SUCCINATE ER 100: 100 | 90 days supply | Qty: 180 | Fill #2

## 2018-05-04 ENCOUNTER — Other Ambulatory Visit: Payer: Self-pay | Admitting: Physician Assistant

## 2018-05-04 ENCOUNTER — Telehealth: Payer: Self-pay | Admitting: Rheumatology

## 2018-05-04 MED FILL — LORazepam 1 MG TABS: 1 | 60 days supply | Qty: 60 | Fill #2

## 2018-05-04 MED FILL — HYDROCODON-APAP 10-325: 10-325 | 30 days supply | Qty: 60 | Fill #0

## 2018-05-04 NOTE — Telephone Encounter (Signed)
Attempted to contact the patient and left message for patient to call the office. Patient will need follow up appointment and to update UDS and narcotic agreement.

## 2018-05-04 NOTE — Telephone Encounter (Signed)
Last Visit: 11/16/17 Next Visit due 04/2018 UDS: 11/16/17 Narc Agreement: 11/16/17  Okay to refill Hydrocodone? 

## 2018-05-04 NOTE — Telephone Encounter (Signed)
Patient returning your call.

## 2018-05-04 NOTE — Telephone Encounter (Signed)
Spoke with patient and schedule for a ROV as well as to update UDS and narcotic agreement.

## 2018-05-04 NOTE — Telephone Encounter (Signed)
Please advise patient to return to update UDS and narcotic agreement.

## 2018-05-08 ENCOUNTER — Telehealth: Payer: Self-pay | Admitting: Pulmonary Disease

## 2018-05-08 NOTE — Telephone Encounter (Signed)
Please make appointment for 8:45 am tomorrow. I already called and informed the pt about this.

## 2018-05-08 NOTE — Telephone Encounter (Signed)
Pt has been scheduled for 8:45a on 05/09/18. Nothing further is needed at this time.

## 2018-05-08 NOTE — Telephone Encounter (Signed)
Dr. Velora Heckler would like to see Dr. Vaughan Browner as soon as he can. First available for Dr.Mannam is 3/24 and this was too late for Dr. Velora Heckler, wants Dr. Vaughan Browner to please call him. [phone].   Will route to [facility] and Dr. Vaughan Browner

## 2018-05-09 ENCOUNTER — Encounter: Payer: Self-pay | Admitting: Pulmonary Disease

## 2018-05-09 ENCOUNTER — Ambulatory Visit (INDEPENDENT_AMBULATORY_CARE_PROVIDER_SITE_OTHER): Payer: Medicare Other | Admitting: Pulmonary Disease

## 2018-05-09 VITALS — BP 144/86 | HR 63 | Ht 73.0 in | Wt 236.2 lb

## 2018-05-09 DIAGNOSIS — R062 Wheezing: Secondary | ICD-10-CM | POA: Diagnosis not present

## 2018-05-09 DIAGNOSIS — J4541 Moderate persistent asthma with (acute) exacerbation: Secondary | ICD-10-CM

## 2018-05-09 LAB — BASIC METABOLIC PANEL
BUN: 15 mg/dL (ref 6–23)
CO2: 29 mEq/L (ref 19–32)
Calcium: 9.4 mg/dL (ref 8.4–10.5)
Chloride: 103 mEq/L (ref 96–112)
Creatinine, Ser: 0.88 mg/dL (ref 0.40–1.50)
GFR: 82.97 mL/min (ref >60.00)
Glucose, Bld: 106 mg/dL — ABNORMAL HIGH (ref 70–99)
Potassium: 3.4 mEq/L — ABNORMAL LOW (ref 3.5–5.1)
Sodium: 140 mEq/L (ref 135–145)

## 2018-05-09 LAB — NITRIC OXIDE: Peripheral Smear Description: 58

## 2018-05-09 LAB — CBC WITH DIFFERENTIAL/PLATELET
Basophils Absolute: 0 10*3/uL (ref 0.0–0.1)
Basophils Relative: 0.7 % (ref 0.0–3.0)
Eosinophils Absolute: 0.2 10*3/uL (ref 0.0–0.7)
Eosinophils Relative: 3.5 % (ref 0.0–5.0)
HCT: 35.1 % — ABNORMAL LOW (ref 39.0–52.0)
Hemoglobin: 11.8 g/dL — ABNORMAL LOW (ref 13.0–17.0)
LYMPHS ABS: 0.9 10*3/uL (ref 0.7–4.0)
Lymphocytes Relative: 14.6 % (ref 12.0–46.0)
MCHC: 33.5 g/dL (ref 30.0–36.0)
MCV: 85.1 fl (ref 78.0–100.0)
Monocytes Absolute: 0.5 10*3/uL (ref 0.1–1.0)
Monocytes Relative: 7.4 % (ref 3.0–12.0)
NEUTROS PCT: 73.8 % (ref 43.0–77.0)
Neutro Abs: 4.8 10*3/uL (ref 1.4–7.7)
Platelets: 206 10*3/uL (ref 150.0–400.0)
RBC: 4.12 Mil/uL — ABNORMAL LOW (ref 4.22–5.81)
RDW: 14.7 % (ref 11.5–15.5)
WBC: 6.5 10*3/uL (ref 4.0–10.5)

## 2018-05-09 LAB — BRAIN NATRIURETIC PEPTIDE: Pro B Natriuretic peptide (BNP): 141 pg/mL — ABNORMAL HIGH (ref 0.0–100.0)

## 2018-05-09 MED ORDER — BUDESONIDE 180 MCG/ACT IN AEPB: 2.0000 | INHALATION_SPRAY | Freq: Two times a day (BID) | RESPIRATORY_TRACT | 6 refills | Status: DC

## 2018-05-09 MED ORDER — BECLOMETHASONE DIPROPIONATE 80 MCG/ACT IN AERS: 2.0000 | INHALATION_SPRAY | Freq: Two times a day (BID) | RESPIRATORY_TRACT | 6 refills | Status: DC

## 2018-05-09 MED ORDER — PREDNISONE 10 MG PO TABS: ORAL_TABLET | ORAL | 0 refills | Status: DC

## 2018-05-09 MED ORDER — AMOXICILLIN-POT CLAVULANATE 875-125 MG PO TABS: 1.0000 | ORAL_TABLET | Freq: Two times a day (BID) | ORAL | 0 refills | Status: DC

## 2018-05-09 MED FILL — AMOX-CLAV 875-125 MG TABLET: 875-125 | 7 days supply | Qty: 14 | Fill #0

## 2018-05-09 MED FILL — PULMICORT 180 MCG FLEXHALER: 180 | 30 days supply | Qty: 1 | Fill #0

## 2018-05-09 MED FILL — predniSONE 10 MG TABS: 10 | 12 days supply | Qty: 30 | Fill #0

## 2018-05-09 NOTE — Addendum Note (Signed)
Addended by: Elton Sin on: 05/09/2018 11:35 AM   Modules accepted: Orders

## 2018-05-09 NOTE — Patient Instructions (Addendum)
We will check some blood test today including CBC differential, metabolic panel, IgE and BNP Prednisone taper starting at 40 mg.  Reduce dose by 10 mg every 3 days We will start you on an inhaler called pulmicort. Prescribed Augmentin 875 mg twice daily for 7 days. We will schedule you for pulmonary function test and follow-up in 2 to 4 weeks

## 2018-05-09 NOTE — Progress Notes (Addendum)
[name], MD    [id number]    1934-09-11  Primary Care Physician:Altheimer, Legrand Como, MD  Referring Physician: [name], MD [place], [place] [address]  Chief complaint: Follow-up for cough, wheezing.  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 fib, coronary artery disease, aortic valve replacement. 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  On intermittent prednisone for osteoarthritis. .  Interim history: Last seen in clinic in 2018 for acute bronchitis after a trip to [place] His come back to clinic today complaining of 6 months of daily symptoms of cough, wheezing that occurs mostly at night.  Cough is associated with clear mucus.  Denies any fevers, chills, body aches, sick contacts He had his annual trip to [place] and January of this year.  No other significant travel.  He started taking Augmentin 2 days ago.  He is also noticed increased lower extremity edema and took extra dose of Lasix with improvement in leg swelling. He does have allergy symptoms, history of atopic allergies and allergic rhinitis.  Denies GERD symptoms.  Outpatient Encounter Medications as of 05/09/2018  Medication Sig  . bisacodyl (DULCOLAX) 5 MG EC tablet Take 5 mg by mouth as needed (constipation).   . butalbital-acetaminophen-caffeine (FIORICET, ESGIC) 50-325-40 MG per tablet Take 1 tablet by mouth 2 (two) times daily as needed (osteo-arthritis pain).   . Cholecalciferol (VITAMIN D3) 2000 units capsule Take 1,000 Units by mouth daily.   . Diclofenac Sodium 1.5 % SOLN Take 1 application by mouth 2 (two) times daily as needed (Knee pain).   Marland Kitchen doxazosin (CARDURA) 4 MG tablet Take 4 mg by mouth at bedtime.   Marland Kitchen ELIQUIS 5 MG TABS tablet TAKE 1 TABLET BY MOUTH 2 TIMES DAILY.   Marland Kitchen HYDROcodone-acetaminophen (NORCO) 10-325 MG tablet TAKE 1 TABLET BY MOUTH TWO TIMES DAILY AS NEEDED FOR MODERATE PAIN  . LORazepam (ATIVAN) 1 MG tablet Take 1 mg by mouth at bedtime.   . magnesium oxide (MAG-OX) 400 MG tablet Take 400 mg by mouth daily.  . Metoprolol Succinate 100 MG CS24 Take 1 tablet by mouth 2 (two) times daily.  . nitroGLYCERIN (NITROSTAT) 0.4 MG SL tablet Place 1 tablet (0.4 mg total) under the tongue every 5 (five) minutes as needed for chest pain.  Marland Kitchen OVER THE COUNTER MEDICATION Apply 1 patch topically daily as needed (neck and shoulder pain). Chinese pain patch  . potassium chloride SA (K-DUR,KLOR-CON) 20 MEQ tablet TAKE 2 TABLETS BY MOUTH DAILY.  Marland Kitchen predniSONE (DELTASONE) 10 MG tablet TAKE 1 TABLET BY MOUTH ONCE DAILY WITH BREAKFAST  . rosuvastatin (CRESTOR) 20 MG tablet TAKE 1 TABLET BY MOUTH AT BEDTIME.   No facility-administered encounter medications on file as of 05/09/2018.     Allergies as of 05/09/2018 - Review Complete 05/09/2018  Allergen Reaction Noted  . Contrast media [iodinated diagnostic agents] Hives 02/01/2011  . Gadolinium derivatives Hives 10/09/2012  . Metrizamide Hives 02/01/2011    Past Medical History:  Diagnosis Date  . Aortic stenosis    moderate aortic stenosis  . Arthritis   . Benign prostatic hypertrophy   . 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  . Coronary artery  disease    status post multiple prior percutaneous coronary interventions, microvascular angina per Dr Olevia Perches  . Heart murmur   . Hypercoagulable state ([place])    chronically anticoagulated with coumadin  . Hyperlipidemia   . Hyperthyroidism   . Hypothyroidism    Dr. Elyse Hsu  . MGUS (monoclonal gammopathy of unknown significance) 02/17/2013  . Paroxysmal atrial fibrillation ([place])    DR. Lia Foyer,   .  Prediabetes 09/21/2017  . Stroke [facility])    1990    Past Surgical History:  Procedure Laterality Date  . AORTIC VALVE REPLACEMENT  03/15/2011   Procedure: AORTIC VALVE REPLACEMENT (AVR);  Surgeon: Gaye Pollack, MD;  Location: [place];  Service: Open Heart Surgery;  Laterality: N/A;  . APPENDECTOMY    . CARDIAC CATHETERIZATION     11  . CARDIOVERSION    . CARDIOVERSION  04/15/2011   Procedure: CARDIOVERSION;  Surgeon: Loralie Champagne, MD;  Location: [place];  Service: Cardiovascular;  Laterality: N/A;  . CARDIOVERSION N/A 09/11/2014   Procedure: CARDIOVERSION;  Surgeon: Sueanne Margarita, MD;  Location: [facility] ENDOSCOPY;  Service: Cardiovascular;  Laterality: N/A;  . CARDIOVERSION N/A 06/27/2015   Procedure: CARDIOVERSION;  Surgeon: Thayer Headings, MD;  Location: [facility] ENDOSCOPY;  Service: Cardiovascular;  Laterality: N/A;  . CARDIOVERSION N/A 07/04/2015   Procedure: CARDIOVERSION;  Surgeon: Evans Lance, MD;  Location: [place];  Service: Cardiovascular;  Laterality: N/A;  . CARDIOVERSION N/A 04/13/2017   Procedure: CARDIOVERSION;  Surgeon: Sueanne Margarita, MD;  Location: [facility] ENDOSCOPY;  Service: Cardiovascular;  Laterality: N/A;  . EP IMPLANTABLE DEVICE N/A 06/16/2015   Procedure: Pacemaker Implant;  Surgeon: Evans Lance, MD;  Location: [place] CV LAB;  Service: Cardiovascular;  Laterality: N/A;  . hemrrhoidectomy    . LEFT AND RIGHT HEART CATHETERIZATION WITH CORONARY ANGIOGRAM Bilateral 02/01/2011   Procedure: LEFT AND RIGHT HEART CATHETERIZATION WITH CORONARY ANGIOGRAM;  Surgeon: Hillary Bow, MD;  Location: [facility] CATH LAB;  Service: Cardiovascular;  Laterality: Bilateral;  . MAZE  03/15/2011   Procedure: MAZE;  Surgeon: Gaye Pollack, MD;  Location: [place];  Service: Open Heart Surgery;  Laterality: N/A;  . PACEMAKER INSERTION  1991   Guidant PPM, most recent Generator Change by Dr Olevia Perches was 08/22/06  . RIGHT/LEFT HEART CATH AND CORONARY ANGIOGRAPHY N/A 07/06/2016   Procedure:  Right/Left Heart Cath and Coronary Angiography;  Surgeon: Sherren Mocha, MD;  Location: [place] CV LAB;  Service: Cardiovascular;  Laterality: N/A;  . TEE WITHOUT CARDIOVERSION  04/15/2011   Procedure: TRANSESOPHAGEAL ECHOCARDIOGRAM (TEE);  Surgeon: Loralie Champagne, MD;  Location: [place];  Service: Cardiovascular;  Laterality: N/A;  . TEE WITHOUT CARDIOVERSION N/A 09/11/2014   Procedure: TRANSESOPHAGEAL ECHOCARDIOGRAM (TEE);  Surgeon: Sueanne Margarita, MD;  Location: [facility] ENDOSCOPY;  Service: Cardiovascular;  Laterality: N/A;    Family History  Problem Relation Age of Onset  . Heart disease Brother        Twin brother has coronary disease and recent AVR for AS  . Anesthesia problems Neg Hx   . Hypotension Neg Hx   . Malignant hyperthermia Neg Hx   . Pseudochol deficiency Neg Hx     Social History   Socioeconomic History  . Marital status: Married    Spouse name: Not on file  . Number of children: Not on file  . Years of education: Not on file  . Highest education level: Not on file  Occupational History  . Not on file  Social Needs  .  Financial resource strain: Not on file  . Food insecurity:    Worry: Not on file    Inability: Not on file  . Transportation needs:    Medical: Not on file    Non-medical: Not on file  Tobacco Use  . Smoking status: Never Smoker  . Smokeless tobacco: Never Used  Substance and Sexual Activity  . Alcohol use: No    Alcohol/week: 0.0 standard drinks  . Drug use: No  . Sexual activity: Not on file  Lifestyle  . Physical activity:    Days per week: Not on file    Minutes per session: Not on file  . Stress: Not on file  Relationships  . Social connections:    Talks on phone: Not on file    Gets together: Not on file    Attends religious service: Not on file    Active member of club or organization: Not on file    Attends meetings of clubs or organizations: Not on file    Relationship status: Not on file  . Intimate partner violence:     Fear of current or ex partner: Not on file    Emotionally abused: Not on file    Physically abused: Not on file    Forced sexual activity: Not on file  Other Topics Concern  . Not on file  Social History Narrative   Married. Has grown children   Retired Horticulturist, commercial MD      Review of systems: Review of Systems  Constitutional: Negative for fever and chills.  HENT: Negative.   Eyes: Negative for blurred vision.  Respiratory: as per HPI  Cardiovascular: Negative for chest pain and palpitations.  Gastrointestinal: Negative for vomiting, diarrhea, blood per rectum. Genitourinary: Negative for dysuria, urgency, frequency and hematuria.  Musculoskeletal: Negative for myalgias, back pain and joint pain.  Skin: Negative for itching and rash.  Neurological: Negative for dizziness, tremors, focal weakness, seizures and loss of consciousness.  Endo/Heme/Allergies: Negative for environmental allergies.  Psychiatric/Behavioral: Negative for depression, suicidal ideas and hallucinations.  All other systems reviewed and are negative.  Physical Exam: Blood pressure (!) 144/86, pulse 63, height 6\' 1"  (1.854 m), weight 236 lb 3.2 oz (107.1 kg), SpO2 97 %. Gen:      No acute distress HEENT:  EOMI, sclera anicteric Neck:     No masses; no thyromegaly Lungs:    Clear to auscultation bilaterally; normal respiratory effort CV:         Regular rate and rhythm; no murmurs Abd:      + bowel sounds; soft, non-tender; no palpable masses, no distension Ext:    No edema; adequate peripheral perfusion Skin:      Warm and dry; no rash Neuro: alert and oriented x 3 Psych: normal mood and affect  Data Reviewed: CXR 03/31/16- no acute cardiopulmonary abnormality. There is no infiltrate or fluid collection. Images personally reviewed.  PFTs 10/01/13 FVC 4.67 [97%) FEV1 3.37 [99%) F/F 72 TLC 103% DLCO 62% Moderate diffusion defect.  FENO 05/09/2018- 58  Assessment:  Moderate persistent asthma Presents  with mild asthma exacerbation in the setting of allergic rhinitis, atopic allergies.  May have a component of volume overload from his cardiac issues as well. Has elevated FENO in office today  He is already on Augmentin therapy.  Will finish a 7-day course I will give him a prednisone taper starting at 40 mg.  Reduce dose by 10 mg every 3 days Start Pulmicort.  He would like to avoid  a beta agonist due to history of atrial fibrillation Check CBC with differential, metabolic panel, BNP and IgE Schedule CXR, pulmonary function tests for reevaluation of lungs.  Plan/Recommendations: - Finish Augmentin - Prednisone taper - Start Qvar - CBC, metabolic panel, BNP, IgE - Chest x-ray, PFTs  Follow-up in 2 to 4 weeks.  Marshell Garfinkel MD [facility] 05/09/2018, 8:48 AM  CC: Altheimer, Legrand Como, MD

## 2018-05-10 LAB — IGE: IgE (Immunoglobulin E), Serum: 28 kU/L (ref <=114)

## 2018-05-24 MED FILL — ELIQUIS 5 MG TABLET: 5 | 90 days supply | Qty: 180 | Fill #2

## 2018-05-26 ENCOUNTER — Telehealth: Payer: Self-pay | Admitting: *Deleted

## 2018-05-26 NOTE — Telephone Encounter (Signed)
Called Dr. Velora Heckler to reschedule his PFT/ office visit. He did reschedule PFT but wishes to wait and see Dr. Vaughan Browner for follow up. Nothing further needed.

## 2018-05-29 ENCOUNTER — Other Ambulatory Visit: Payer: Self-pay | Admitting: Oncology

## 2018-05-29 ENCOUNTER — Telehealth: Payer: Self-pay | Admitting: Rheumatology

## 2018-05-29 NOTE — Telephone Encounter (Signed)
Patient called requesting prescription refill of Hydrocodone to be sent to [facility].  Patient was scheduled for appointment with Dr. Estanislado Pandy on 05/30/18 to update UDS and narcotic agreement.  Patient does not want to have a virtual appointment and prefers to reschedule when she is back in the office, but doesn't want the missed appointment to affect getting his prescription.  Patient requested a return call.

## 2018-05-29 NOTE — Telephone Encounter (Signed)
Attempted to contact the patient and left message for patient to call the office. Per Dr. Estanislado Pandy patient will need to go to the lab to get UDS and we can mail the narcotic agreement to be signed.

## 2018-05-30 ENCOUNTER — Ambulatory Visit: Payer: Self-pay | Admitting: Rheumatology

## 2018-05-31 ENCOUNTER — Telehealth: Payer: Self-pay | Admitting: Rheumatology

## 2018-05-31 DIAGNOSIS — G8929 Other chronic pain: Secondary | ICD-10-CM

## 2018-05-31 DIAGNOSIS — Z5181 Encounter for therapeutic drug level monitoring: Secondary | ICD-10-CM

## 2018-05-31 NOTE — Telephone Encounter (Signed)
attempted to contact patient and left message on machine to advise patient to call the office.  

## 2018-05-31 NOTE — Telephone Encounter (Signed)
Patient left voicemail stating he was returning Andrea's call.

## 2018-06-01 NOTE — Addendum Note (Signed)
Addended by: Carole Binning on: 06/01/2018 02:38 PM   Modules accepted: Orders

## 2018-06-01 NOTE — Telephone Encounter (Signed)
Patient advised he will need to have a UDS at the [facility] and we will mail narc agreement for him to sign and send back. Patient to schedule appointment once we are seeing patients in the office again. Patient verbalized understanding.

## 2018-06-02 ENCOUNTER — Other Ambulatory Visit: Payer: Self-pay | Admitting: *Deleted

## 2018-06-02 MED ORDER — HYDROCODONE-ACETAMINOPHEN 10-325 MG PO TABS: ORAL_TABLET | ORAL | 0 refills | Status: DC

## 2018-06-02 MED FILL — HYDROCODON-APAP 10-325: 10-325 | 30 days supply | Qty: 60 | Fill #0

## 2018-06-02 NOTE — Telephone Encounter (Signed)
Last Visit: 11/16/17 Next Visit due 04/2018 UDS: 11/16/17 Narc Agreement: 11/16/17  Okay to refill Hydrocodone?

## 2018-06-05 ENCOUNTER — Other Ambulatory Visit: Payer: Self-pay | Admitting: Cardiovascular Disease

## 2018-06-05 ENCOUNTER — Other Ambulatory Visit: Payer: Self-pay | Admitting: *Deleted

## 2018-06-05 DIAGNOSIS — Z5181 Encounter for therapeutic drug level monitoring: Secondary | ICD-10-CM

## 2018-06-05 DIAGNOSIS — G8929 Other chronic pain: Secondary | ICD-10-CM

## 2018-06-05 MED FILL — DOXAZOSIN MESYLATE 4 MG TAB: 4 | 90 days supply | Qty: 90 | Fill #0

## 2018-06-06 MED FILL — ROSUVASTATIN CALCIUM 20 MG: 20 | 90 days supply | Qty: 90 | Fill #0 | Status: TO

## 2018-06-07 MED FILL — BUTALBITAL-APAP-CAFFEINE 50: 50-325-40 | 90 days supply | Qty: 180 | Fill #0

## 2018-06-16 ENCOUNTER — Ambulatory Visit: Payer: Medicare Other | Admitting: Pulmonary Disease

## 2018-06-26 ENCOUNTER — Telehealth: Payer: Self-pay | Admitting: Internal Medicine

## 2018-06-26 MED ORDER — METRONIDAZOLE 500 MG PO TABS: 500.0000 mg | ORAL_TABLET | Freq: Two times a day (BID) | ORAL | 0 refills | Status: DC

## 2018-06-26 MED FILL — METRONIDAZOLE 500 MG TABS: 500 | 10 days supply | Qty: 20 | Fill #0

## 2018-06-26 NOTE — Telephone Encounter (Signed)
Diverticulitis flare LLQ pain No fevers  Took Augmentin 4- 6 weeks ago and had diarrhea so quit  No fever - gets a temp every day at [facility]  Has some cipro on hand  Plan cipro and metronidazole 500 mg bid each x 10 d  If not getting better call back

## 2018-06-28 ENCOUNTER — Telehealth: Payer: Self-pay | Admitting: Internal Medicine

## 2018-06-28 NOTE — Telephone Encounter (Signed)
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  He had taken some Augmentin about 6 weeks prior for similar complaints but only took it for a few days because he developed diarrhea.  He has been on Cipro for about 3 days and metronidazole for 2 days now as it was started a little bit later than the Cipro.  He has had about 15 bowel movements in the past couple of days, mostly just during the daytime.  He is having some borborygmi.  There is just slight left lower quadrant abdominal tenderness but no significant pain in fact he feels better otherwise with respect to this.  He had been using some MiraLAX.  No true fever though he feels a little feverish at times.  I have asked him not to take any more MiraLAX and give this 1 more day and to update me tomorrow.  Should things change in the interim he is to contact me sooner.

## 2018-06-30 MED FILL — LORazepam 1 MG TABS: 1 | 60 days supply | Qty: 60 | Fill #0

## 2018-07-10 ENCOUNTER — Other Ambulatory Visit: Payer: Self-pay | Admitting: Physician Assistant

## 2018-07-10 NOTE — Telephone Encounter (Signed)
Last Visit: 11/16/17 Next Visit due 04/2018 UDS: 11/16/17 Narc Agreement: 11/16/17  Okay to refill Hydrocodone?

## 2018-07-11 ENCOUNTER — Telehealth: Payer: Self-pay | Admitting: Rheumatology

## 2018-07-11 MED FILL — HYDROCODON-APAP 10-325: 10-325 | 30 days supply | Qty: 60 | Fill #0

## 2018-07-11 NOTE — Telephone Encounter (Signed)
Please advise patient to update UDS and narcotic agreement.

## 2018-07-11 NOTE — Telephone Encounter (Signed)
Pam from [facility] called stating she received a call from the patient who requested she call our office regarding his refill request.  I forwarded the message to Seth Bake who stated she left a message for patient to return her call regarding refill.  I let Pam know that Seth Bake would be calling the patient back.

## 2018-07-11 NOTE — Telephone Encounter (Signed)
Spoke with patient who will come to the office next week to update UDS and narcotic agreement in the office next week. Sent prescription refill to Hazel Sams, PA-C to be sent to the pharmacy.

## 2018-07-11 NOTE — Telephone Encounter (Signed)
Attempted to contact the patient and left message for patient to call the office.  

## 2018-07-11 NOTE — Telephone Encounter (Signed)
Patient will come to office and update next week.

## 2018-07-19 ENCOUNTER — Telehealth: Payer: Self-pay | Admitting: Internal Medicine

## 2018-07-19 NOTE — Telephone Encounter (Signed)
New message    Pt set up for virtual visit with Dr. Lovena Le on 05.26.20. Pt prefers a phone call, phone number is listed in appt notes.      Virtual Visit Pre-Appointment Phone Call  "(Name), I am calling you today to discuss your upcoming appointment. We are currently trying to limit exposure to the virus that causes COVID-19 by seeing patients at home rather than in the office."  1. "What is the BEST phone number to call the day of the visit?" - include this in appointment notes  2. Do you have or have access to (through a family member/friend) a smartphone with video capability that we can use for your visit?" a. If yes - list this number in appt notes as cell (if different from BEST phone #) and list the appointment type as a VIDEO visit in appointment notes b. If no - list the appointment type as a PHONE visit in appointment notes  3. Confirm consent - "In the setting of the current Covid19 crisis, you are scheduled for a (phone or video) visit with your provider on (date) at (time).  Just as we do with many in-office visits, in order for you to participate in this visit, we must obtain consent.  If you'd like, I can send this to your mychart (if signed up) or email for you to review.  Otherwise, I can obtain your verbal consent now.  All virtual visits are billed to your insurance company just like a normal visit would be.  By agreeing to a virtual visit, we'd like you to understand that the technology does not allow for your provider to perform an examination, and thus may limit your provider's ability to fully assess your condition. If your provider identifies any concerns that need to be evaluated in person, we will make arrangements to do so.  Finally, though the technology is pretty good, we cannot assure that it will always work on either your or our end, and in the setting of a video visit, we may have to convert it to a phone-only visit.  In either situation, we cannot ensure that  we have a secure connection.  Are you willing to proceed?" STAFF: Did the patient verbally acknowledge consent to telehealth visit? Document YES/NO here: YES  4. Advise patient to be prepared - "Two hours prior to your appointment, go ahead and check your blood pressure, pulse, oxygen saturation, and your weight (if you have the equipment to check those) and write them all down. When your visit starts, your provider will ask you for this information. If you have an Apple Watch or Kardia device, please plan to have heart rate information ready on the day of your appointment. Please have a pen and paper handy nearby the day of the visit as well."  5. Give patient instructions for MyChart download to smartphone OR Doximity/Doxy.me as below if video visit (depending on what platform provider is using)  6. 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 I spoke with the patient via phone to ensure availability of phone/video source, confirm preferred email & phone number, and discuss instructions and expectations.  I reminded [name], MD to be prepared with  any vital sign and/or heart rhythm information that could potentially be obtained via home monitoring, at the time of his visit. I reminded [name], MD to expect a phone call prior to his visit.  [name] 07/19/2018 11:39 AM   INSTRUCTIONS FOR DOWNLOADING THE MYCHART APP TO SMARTPHONE  - The patient must first make sure to have activated MyChart and know their login information - If Apple, go to [employer] and type in MyChart in the search bar and download the app. If Android, ask patient to go to [employer] and type in [place] in the search bar  and download the app. The app is free but as with any other app downloads, their phone may require them to verify saved payment information or Apple/Android password.  - The patient will need to then log into the app with their MyChart username and password, and select [facility] as their healthcare provider to link the account. When it is time for your visit, go to the MyChart app, find appointments, and click Begin Video Visit. Be sure to Select Allow for your device to access the Microphone and Camera for your visit. You will then be connected, and your provider will be with you shortly.  **If they have any issues connecting, or need assistance please contact MyChart service desk [phone])**  **If using a computer, in order to ensure the best quality for their visit they will need to use either of the following Internet Browsers: [employer], or Google Chrome**  IF USING DOXIMITY or DOXY.ME - The patient will receive a link just prior to their visit by text.     FULL LENGTH CONSENT FOR TELE-HEALTH VISIT   I hereby voluntarily request, consent and authorize [facility] and its employed or contracted physicians, physician assistants, nurse practitioners or other licensed health care professionals (the Practitioner), to provide me with telemedicine health care services (the Services") as deemed necessary by the treating Practitioner. I acknowledge and consent to receive the Services by the Practitioner via telemedicine. I understand that the telemedicine visit will involve communicating with the Practitioner through live audiovisual communication technology and the disclosure of certain medical information by electronic transmission. I acknowledge that I have been given the opportunity to request an in-person assessment or other available alternative prior to the telemedicine visit and am voluntarily participating in the telemedicine visit.  I understand that I have the  right to withhold or withdraw my consent to the use of telemedicine in the course of my care at any time, without affecting my right to future care or treatment, and that the Practitioner or I may terminate the telemedicine visit at any time. I understand that I have the right to inspect all information obtained and/or recorded in the course of the telemedicine visit and may receive copies of available information for a reasonable fee.  I understand that some of the potential risks of receiving the Services via telemedicine include:   Delay or interruption in medical evaluation due to technological equipment failure or disruption;  Information transmitted may not be sufficient (e.g. poor resolution of images) to allow for appropriate medical decision making by the Practitioner; and/or   In rare instances, security protocols could fail, causing a breach of personal health information.  Furthermore, I acknowledge that it is my responsibility to provide information about my medical history, conditions and care that is complete and accurate to the best of my ability. I acknowledge that Practitioner's advice, recommendations,  and/or decision may be based on factors not within their control, such as incomplete or inaccurate data provided by me or distortions of diagnostic images or specimens that may result from electronic transmissions. I understand that the practice of medicine is not an exact science and that Practitioner makes no warranties or guarantees regarding treatment outcomes. I acknowledge that I will receive a copy of this consent concurrently upon execution via email to the email address I last provided but may also request a printed copy by calling the office of [facility].    I understand that my insurance will be billed for this visit.   I have read or had this consent read to me.  I understand the contents of this consent, which adequately explains the benefits and risks of the Services  being provided via telemedicine.   I have been provided ample opportunity to ask questions regarding this consent and the Services and have had my questions answered to my satisfaction.  I give my informed consent for the services to be provided through the use of telemedicine in my medical care  By participating in this telemedicine visit I agree to the above.

## 2018-07-25 ENCOUNTER — Telehealth (INDEPENDENT_AMBULATORY_CARE_PROVIDER_SITE_OTHER): Payer: Medicare Other | Admitting: Internal Medicine

## 2018-07-25 ENCOUNTER — Other Ambulatory Visit: Payer: Self-pay | Admitting: Internal Medicine

## 2018-07-25 ENCOUNTER — Other Ambulatory Visit: Payer: Self-pay

## 2018-07-25 DIAGNOSIS — I480 Paroxysmal atrial fibrillation: Secondary | ICD-10-CM

## 2018-07-25 DIAGNOSIS — I5032 Chronic diastolic (congestive) heart failure: Secondary | ICD-10-CM | POA: Diagnosis not present

## 2018-07-25 DIAGNOSIS — I4819 Other persistent atrial fibrillation: Secondary | ICD-10-CM

## 2018-07-25 MED FILL — POTASSIUM CHLORIDE CRYS ER: 20 | 30 days supply | Qty: 60 | Fill #1

## 2018-07-25 NOTE — Progress Notes (Signed)
Electrophysiology TeleHealth Note   Due to national recommendations of social distancing due to COVID 19, an audio/video telehealth visit is felt to be most appropriate for this patient at this time.  See MyChart message from today for the patient's consent to telehealth for [facility].   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 He notes early morning orthostasis and his bp has been a little on the low side. He has moved to [employer] Since last being seen in our clinic, the patient reports doing very well. The patient is otherwise without complaint today.  The patient denies symptoms of fevers, chills, cough, or new SOB worrisome for COVID 19.  Past Medical History:  Diagnosis Date  . Aortic stenosis    moderate aortic stenosis  . Arthritis   . Benign prostatic hypertrophy   . 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  . Coronary artery disease    status post multiple prior percutaneous coronary interventions, microvascular angina per Dr Olevia Perches  . Heart murmur   . Hypercoagulable state ([place])    chronically anticoagulated with coumadin  . Hyperlipidemia   . Hyperthyroidism   . Hypothyroidism     Dr. Elyse Hsu  . MGUS (monoclonal gammopathy of unknown significance) 02/17/2013  . Paroxysmal atrial fibrillation ([place])    DR. Lia Foyer,   . Prediabetes 09/21/2017  . Stroke [facility])    1990    Past Surgical History:  Procedure Laterality Date  . AORTIC VALVE REPLACEMENT  03/15/2011   Procedure: AORTIC VALVE REPLACEMENT (AVR);  Surgeon: Gaye Pollack, MD;  Location: [place];  Service: Open Heart Surgery;  Laterality: N/A;  . APPENDECTOMY    . CARDIAC CATHETERIZATION     11  . CARDIOVERSION    . CARDIOVERSION  04/15/2011   Procedure: CARDIOVERSION;  Surgeon: Loralie Champagne, MD;  Location: [place];  Service: Cardiovascular;  Laterality: N/A;  . CARDIOVERSION N/A 09/11/2014   Procedure: CARDIOVERSION;  Surgeon: Sueanne Margarita, MD;  Location: [facility] ENDOSCOPY;  Service: Cardiovascular;  Laterality: N/A;  . CARDIOVERSION N/A 06/27/2015   Procedure: CARDIOVERSION;  Surgeon: Thayer Headings, MD;  Location: [facility] ENDOSCOPY;  Service: Cardiovascular;  Laterality: N/A;  . CARDIOVERSION N/A 07/04/2015   Procedure: CARDIOVERSION;  Surgeon: Evans Lance, MD;  Location: [place];  Service: Cardiovascular;  Laterality: N/A;  . CARDIOVERSION N/A 04/13/2017   Procedure: CARDIOVERSION;  Surgeon: Sueanne Margarita, MD;  Location: [facility] ENDOSCOPY;  Service: Cardiovascular;  Laterality: N/A;  . EP IMPLANTABLE DEVICE N/A 06/16/2015   Procedure: Pacemaker Implant;  Surgeon: Evans Lance, MD;  Location: [place] CV LAB;  Service: Cardiovascular;  Laterality: N/A;  . hemrrhoidectomy    . LEFT AND RIGHT HEART CATHETERIZATION WITH CORONARY  ANGIOGRAM Bilateral 02/01/2011   Procedure: LEFT AND RIGHT HEART CATHETERIZATION WITH CORONARY ANGIOGRAM;  Surgeon: Hillary Bow, MD;  Location: [facility] CATH LAB;  Service: Cardiovascular;  Laterality: Bilateral;  . MAZE  03/15/2011   Procedure: MAZE;  Surgeon: Gaye Pollack, MD;  Location: [place];  Service: Open Heart Surgery;  Laterality: N/A;  . PACEMAKER INSERTION  1991   Guidant  PPM, most recent Generator Change by Dr Olevia Perches was 08/22/06  . RIGHT/LEFT HEART CATH AND CORONARY ANGIOGRAPHY N/A 07/06/2016   Procedure: Right/Left Heart Cath and Coronary Angiography;  Surgeon: Sherren Mocha, MD;  Location: [place] CV LAB;  Service: Cardiovascular;  Laterality: N/A;  . TEE WITHOUT CARDIOVERSION  04/15/2011   Procedure: TRANSESOPHAGEAL ECHOCARDIOGRAM (TEE);  Surgeon: Loralie Champagne, MD;  Location: [place];  Service: Cardiovascular;  Laterality: N/A;  . TEE WITHOUT CARDIOVERSION N/A 09/11/2014   Procedure: TRANSESOPHAGEAL ECHOCARDIOGRAM (TEE);  Surgeon: Sueanne Margarita, MD;  Location: [facility] ENDOSCOPY;  Service: Cardiovascular;  Laterality: N/A;    Current Outpatient Medications  Medication Sig Dispense Refill  . beclomethasone (QVAR) 80 MCG/ACT inhaler Inhale 2 puffs into the lungs 2 (two) times daily. 1 Inhaler 6  . bisacodyl (DULCOLAX) 5 MG EC tablet Take 5 mg by mouth as needed (constipation).     . budesonide (PULMICORT FLEXHALER) 180 MCG/ACT inhaler Inhale 2 puffs into the lungs 2 (two) times daily. 1 each 6  . butalbital-acetaminophen-caffeine (FIORICET, ESGIC) 50-325-40 MG per tablet Take 1 tablet by mouth 2 (two) times daily as needed (osteo-arthritis pain).     . Cholecalciferol (VITAMIN D3) 2000 units capsule Take 1,000 Units by mouth daily.     . Diclofenac Sodium 1.5 % SOLN Take 1 application by mouth 2 (two) times daily as needed (Knee pain).     Marland Kitchen doxazosin (CARDURA) 4 MG tablet Take 4 mg by mouth at bedtime.     Marland Kitchen ELIQUIS 5 MG TABS tablet TAKE 1 TABLET BY MOUTH 2 TIMES DAILY. 180 tablet 3  . HYDROcodone-acetaminophen (NORCO) 10-325 MG tablet TAKE 1 TABLET BY MOUTH TWO TIMES DAILY AS NEEDED FOR MODERATE PAIN 60 tablet 0  . LORazepam (ATIVAN) 1 MG tablet Take 1 mg by mouth at bedtime.     . magnesium oxide (MAG-OX) 400 MG tablet Take 400 mg by mouth daily.    . Metoprolol Succinate 100 MG CS24 Take 1 tablet by mouth 2 (two) times daily.    . metroNIDAZOLE  (FLAGYL) 500 MG tablet Take 1 tablet (500 mg total) by mouth 2 (two) times daily. 20 tablet 0  . nitroGLYCERIN (NITROSTAT) 0.4 MG SL tablet Place 1 tablet (0.4 mg total) under the tongue every 5 (five) minutes as needed for chest pain. 30 tablet 1  . OVER THE COUNTER MEDICATION Apply 1 patch topically daily as needed (neck and shoulder pain). Chinese pain patch    . potassium chloride SA (K-DUR,KLOR-CON) 20 MEQ tablet TAKE 2 TABLETS BY MOUTH DAILY. 60 tablet 10  . predniSONE (DELTASONE) 10 MG tablet TAKE 1 TABLET BY MOUTH ONCE DAILY WITH BREAKFAST 30 tablet 5  . predniSONE (DELTASONE) 10 MG tablet 4 tabs x's 3 days,3tabs x's 3days,2tabs x's 3 days,1 tab x's 3 days,then stop 30 tablet 0  . rosuvastatin (CRESTOR) 20 MG tablet TAKE 1 TABLET BY MOUTH AT BEDTIME. 90 tablet 3   No current facility-administered medications for this visit.     Allergies:   Contrast media [iodinated diagnostic agents]; Gadolinium derivatives; and Metrizamide   Social History:  The  patient  reports that he has never smoked. He has never used smokeless tobacco. He reports that he does not drink alcohol or use drugs.   Family History:  The patient's  family history includes Heart disease in his brother.   ROS:  Please see the history of present illness.   All other systems are personally reviewed and negative.    Exam:    Vital Signs:  There were no vitals taken for this visit.   Labs/Other Tests and Data Reviewed:    Recent Labs: 09/02/2017: ALT 19; TSH 2.750 05/09/2018: BUN 15; Creatinine, Ser 0.88; Hemoglobin 11.8; Platelets 206.0; Potassium 3.4; Pro B Natriuretic peptide (BNP) 141.0; Sodium 140   Wt Readings from Last 3 Encounters:  05/09/18 236 lb 3.2 oz (107.1 kg)  02/10/18 220 lb (99.8 kg)  01/20/18 222 lb (100.7 kg)     Other studies personally reviewed: The patient presents wearable device technology report for my review today. On my review, the patient presents with Apple Watch tracings demonstrate  HR's in the 60-95 range.   ASSESSMENT & PLAN:    1.  Atrial fib/flutter - his rates appear to be well controlled. On his Apple watch, HR's in the 60-95 range. He will reduce his dose of nocturnal toprol. 2. orthostasis - he is mild/moderatly symptomatic and I have asked him to reduce his dose of the toprol in the evening. 3. PPM - I have asked him to return in 3 months. We will try and start doing home monitoring.  4. Chronic systolic/diastolic heart failure - his symptoms are class 2. He is exercising several times a week. 5. COVID 19 screen The patient denies symptoms of COVID 19 at this time.  The importance of social distancing was discussed today.  Follow-up:  3 months in the office Next remote: n/a  Current medicines are reviewed at length with the patient today.   The patient does not have concerns regarding his medicines.  The following changes were made today:  none  Labs/ tests ordered today include: none No orders of the defined types were placed in this encounter.    Patient Risk:  after full review of this patients clinical status, I feel that they are at moderate risk at this time.  Today, I have spent 25 minutes with the patient with telehealth technology discussing all of the above.    Signed, Cristopher Peru, MD  07/25/2018 10:39 AM     [facility] [address] [place] [address] [phone] (office) [phone] (fax)

## 2018-07-27 ENCOUNTER — Other Ambulatory Visit: Payer: Self-pay | Admitting: Internal Medicine

## 2018-07-27 DIAGNOSIS — I480 Paroxysmal atrial fibrillation: Secondary | ICD-10-CM

## 2018-07-31 ENCOUNTER — Other Ambulatory Visit: Payer: Self-pay | Admitting: Internal Medicine

## 2018-07-31 DIAGNOSIS — M25511 Pain in right shoulder: Secondary | ICD-10-CM | POA: Diagnosis not present

## 2018-07-31 DIAGNOSIS — M25512 Pain in left shoulder: Secondary | ICD-10-CM | POA: Diagnosis not present

## 2018-07-31 DIAGNOSIS — M19012 Primary osteoarthritis, left shoulder: Secondary | ICD-10-CM | POA: Diagnosis not present

## 2018-07-31 DIAGNOSIS — I480 Paroxysmal atrial fibrillation: Secondary | ICD-10-CM

## 2018-07-31 DIAGNOSIS — M19011 Primary osteoarthritis, right shoulder: Secondary | ICD-10-CM | POA: Diagnosis not present

## 2018-07-31 MED FILL — METOPROLOL SUCCINATE ER 100: 100 | 90 days supply | Qty: 180 | Fill #0

## 2018-08-07 ENCOUNTER — Other Ambulatory Visit: Payer: Self-pay | Admitting: Physician Assistant

## 2018-08-07 ENCOUNTER — Other Ambulatory Visit: Payer: Self-pay | Admitting: Pulmonary Disease

## 2018-08-07 MED FILL — POTASSIUM CHLORIDE CRYS ER: 20 | 30 days supply | Qty: 60 | Fill #2

## 2018-08-07 MED FILL — DOXAZOSIN MESYLATE 4 MG TAB: 4 | 90 days supply | Qty: 90 | Fill #1

## 2018-08-07 MED FILL — ROSUVASTATIN CALCIUM 20 MG: 20 | 90 days supply | Qty: 90 | Fill #1 | Status: TO

## 2018-08-07 MED FILL — LORazepam 1 MG TABS: 1 | 60 days supply | Qty: 60 | Fill #1

## 2018-08-07 MED FILL — ELIQUIS 5 MG TABLET: 5 | 90 days supply | Qty: 180 | Fill #3

## 2018-08-07 NOTE — Progress Notes (Signed)
Virtual Visit via Telephone Note  I connected with [name], MD on 08/08/18 at 11:30 AM EDT by telephone and verified that I am speaking with the correct person using two identifiers.  Location: Patient: At home Provider:In the office.   I discussed the limitations, risks, security and privacy concerns of performing an evaluation and management service by telephone and the availability of in person appointments. I also discussed with the patient that there may be a patient responsible charge related to this service. The patient expressed understanding and agreed to proceed. This service was conducted via virtual visit.  Both audio tools were used.  The patient was located at home. I was located in my office.  Consent was obtained prior to the virtual visit and is aware of possible charges through their insurance for this visit.  The patient is an established patient.  Dr. Estanislado Pandy, MD conducted the virtual visit .  Office staff helped with scheduling follow up visits after the service was conducted.    CC: History of Present Illness: Patient is a 84 year old male with a past medical history of osteoarthritis and DDD.  According to patient he continues to have a lot of pain and discomfort in his multiple joints.  He has been having severe pain and discomfort in his left shoulder and having difficulty lifting his arm.  He states he was seen by Dr. Onnie Graham who advised surgery but he is not ready and prepared for it.  He continues to have some stiffness in his hands which is tolerable.  He also has discomfort in his hip joints and knee joints.  He has been experiencing generalized pain and fatigue.  He states despite of taking Vicodin he has lives in a lot of discomfort.  The Vicodin makes his pain manageable.  He cannot take any NSAIDs or any other medications due to multiple medical problems.  He denies any joint swelling currently.  He states he would like to have left shoulder joint cortisone  injections in the future.  Review of Systems  Constitutional: Negative for fever and malaise/fatigue.  Eyes: Negative for photophobia, pain, discharge and redness.  Respiratory: Negative for cough, shortness of breath and wheezing.   Cardiovascular: Negative for chest pain and palpitations.  Gastrointestinal: Negative for blood in stool, constipation and diarrhea.  Genitourinary: Negative for dysuria.  Musculoskeletal: Positive for joint pain and myalgias. Negative for back pain and neck pain.  Skin: Negative for rash.  Neurological: Negative for dizziness and headaches.  Psychiatric/Behavioral: Negative for depression. The patient has insomnia. The patient is not nervous/anxious.       Observations/Objective: Physical Exam  Constitutional: He is oriented to person, place, and time.  Neurological: He is alert and oriented to person, place, and time.  Psychiatric: Mood, memory, affect and judgment normal.   Patient reports morning stiffness for 2 hours.   Patient reports nocturnal pain.  Difficulty dressing/grooming: Reports Difficulty climbing stairs: Reports Difficulty getting out of chair: Reports Difficulty using hands for taps, buttons, cutlery, and/or writing: Reports  Fax # of the lab: [phone]  UDS  Assessment and Plan: Primary osteoarthritis of both hands-he continues to have pain and stiffness in his bilateral hands.  Primary osteoarthritis of both shoulders-he has been having severe pain in his left shoulder with decreased range of motion.  He was recently seen by Dr. Onnie Graham who suggested surgery.  He is not prepared for surgery due to medical conditions.  He may consider cortisone injection in future.  DDD (degenerative disc disease), cervical-he has been having discomfort in his cervical spine.  DDD (degenerative disc disease), lumbar-he has chronic lower back pain.  Trochanteric bursitis of right hip-continues to have discomfort at night.  He has been doing  IT band exercises.  Other fatigue -he is chronic fatigue.  Other chronic pain -he is on Hydrocodone for pain management..  He has been on the same dose of hydrocodone for many years.  He states he takes it on PRN basis to manage discomfort.  He is unable to take any NSAIDs due to being on anticoagulants.    I will obtain UDS today.  The order will be faxed to his lab as per his request.  History of pacemaker  S/P AVR  History of vitamin D deficiency-he is on vitamin D supplement.  Paroxysmal atrial fibrillation (HCC) - He has a pacemaker  MGUS (monoclonal gammopathy of unknown significance)-followed up by hematology.  History of stroke -  /[place]   Primary hypercoagulable state [facility])  Coronary artery disease with exertional angina ([place])  Follow Up Instructions: He will follow up in    I discussed the assessment and treatment plan with the patient. The patient was provided an opportunity to ask questions and all were answered. The patient agreed with the plan and demonstrated an understanding of the instructions.   The patient was advised to call back or seek an in-person evaluation if the symptoms worsen or if the condition fails to improve as anticipated.     Bo Merino, MD

## 2018-08-07 NOTE — Telephone Encounter (Signed)
Last Visit: 11/16/2017 Next Visit: 08/08/2018 UDS: 11/16/2017 (order has been placed for updated UDS) Narc Agreement: 11/16/2017  Last fill: 07/11/2018  Okay to refill hydrocodone?

## 2018-08-08 ENCOUNTER — Telehealth: Payer: Self-pay | Admitting: Rheumatology

## 2018-08-08 ENCOUNTER — Telehealth (INDEPENDENT_AMBULATORY_CARE_PROVIDER_SITE_OTHER): Payer: Medicare Other | Admitting: Rheumatology

## 2018-08-08 DIAGNOSIS — G8929 Other chronic pain: Secondary | ICD-10-CM

## 2018-08-08 DIAGNOSIS — M19042 Primary osteoarthritis, left hand: Secondary | ICD-10-CM

## 2018-08-08 DIAGNOSIS — M19041 Primary osteoarthritis, right hand: Secondary | ICD-10-CM | POA: Diagnosis not present

## 2018-08-08 DIAGNOSIS — D472 Monoclonal gammopathy: Secondary | ICD-10-CM

## 2018-08-08 DIAGNOSIS — D6859 Other primary thrombophilia: Secondary | ICD-10-CM

## 2018-08-08 DIAGNOSIS — Z8639 Personal history of other endocrine, nutritional and metabolic disease: Secondary | ICD-10-CM

## 2018-08-08 DIAGNOSIS — M5030 Other cervical disc degeneration, unspecified cervical region: Secondary | ICD-10-CM

## 2018-08-08 DIAGNOSIS — M7918 Myalgia, other site: Secondary | ICD-10-CM

## 2018-08-08 DIAGNOSIS — I25118 Atherosclerotic heart disease of native coronary artery with other forms of angina pectoris: Secondary | ICD-10-CM

## 2018-08-08 DIAGNOSIS — M19012 Primary osteoarthritis, left shoulder: Secondary | ICD-10-CM

## 2018-08-08 DIAGNOSIS — M19011 Primary osteoarthritis, right shoulder: Secondary | ICD-10-CM

## 2018-08-08 DIAGNOSIS — M7061 Trochanteric bursitis, right hip: Secondary | ICD-10-CM

## 2018-08-08 DIAGNOSIS — Z5181 Encounter for therapeutic drug level monitoring: Secondary | ICD-10-CM

## 2018-08-08 DIAGNOSIS — R5383 Other fatigue: Secondary | ICD-10-CM

## 2018-08-08 DIAGNOSIS — M5136 Other intervertebral disc degeneration, lumbar region: Secondary | ICD-10-CM

## 2018-08-08 DIAGNOSIS — I480 Paroxysmal atrial fibrillation: Secondary | ICD-10-CM

## 2018-08-08 DIAGNOSIS — Z950 Presence of cardiac pacemaker: Secondary | ICD-10-CM

## 2018-08-08 DIAGNOSIS — Z8673 Personal history of transient ischemic attack (TIA), and cerebral infarction without residual deficits: Secondary | ICD-10-CM

## 2018-08-08 DIAGNOSIS — Z952 Presence of prosthetic heart valve: Secondary | ICD-10-CM

## 2018-08-08 MED FILL — HYDROCODON-APAP 10-325: 10-325 | 30 days supply | Qty: 60 | Fill #0

## 2018-08-08 NOTE — Telephone Encounter (Signed)
I LMOM for patient to call, and schedule a rov for 6 months (around 02/07/2019) with Dr. Estanislado Pandy.

## 2018-08-09 ENCOUNTER — Telehealth: Payer: Self-pay | Admitting: Oncology

## 2018-08-09 NOTE — Telephone Encounter (Signed)
Spoke to Dr. Velora Heckler and confirmed appt for him to see Dr. Benay Spice on 6/11 at 2pm. He has been made aware to arrive 15 minutes early.

## 2018-08-10 ENCOUNTER — Other Ambulatory Visit: Payer: Self-pay

## 2018-08-10 ENCOUNTER — Encounter: Payer: Self-pay | Admitting: Rheumatology

## 2018-08-10 ENCOUNTER — Inpatient Hospital Stay: Payer: Medicare Other

## 2018-08-10 ENCOUNTER — Telehealth: Payer: Self-pay | Admitting: Oncology

## 2018-08-10 ENCOUNTER — Inpatient Hospital Stay: Payer: Medicare Other | Attending: Oncology | Admitting: Oncology

## 2018-08-10 VITALS — BP 167/113 | HR 79 | Temp 98.9°F | Resp 16 | Ht 73.0 in

## 2018-08-10 DIAGNOSIS — R7303 Prediabetes: Secondary | ICD-10-CM | POA: Diagnosis not present

## 2018-08-10 DIAGNOSIS — I350 Nonrheumatic aortic (valve) stenosis: Secondary | ICD-10-CM | POA: Diagnosis not present

## 2018-08-10 DIAGNOSIS — Z952 Presence of prosthetic heart valve: Secondary | ICD-10-CM | POA: Diagnosis not present

## 2018-08-10 DIAGNOSIS — K5792 Diverticulitis of intestine, part unspecified, without perforation or abscess without bleeding: Secondary | ICD-10-CM | POA: Diagnosis not present

## 2018-08-10 DIAGNOSIS — E785 Hyperlipidemia, unspecified: Secondary | ICD-10-CM | POA: Diagnosis not present

## 2018-08-10 DIAGNOSIS — Z950 Presence of cardiac pacemaker: Secondary | ICD-10-CM

## 2018-08-10 DIAGNOSIS — I480 Paroxysmal atrial fibrillation: Secondary | ICD-10-CM | POA: Diagnosis not present

## 2018-08-10 DIAGNOSIS — Z79899 Other long term (current) drug therapy: Secondary | ICD-10-CM | POA: Diagnosis not present

## 2018-08-10 DIAGNOSIS — D805 Immunodeficiency with increased immunoglobulin M [IgM]: Secondary | ICD-10-CM | POA: Diagnosis not present

## 2018-08-10 DIAGNOSIS — I2510 Atherosclerotic heart disease of native coronary artery without angina pectoris: Secondary | ICD-10-CM | POA: Insufficient documentation

## 2018-08-10 DIAGNOSIS — E039 Hypothyroidism, unspecified: Secondary | ICD-10-CM | POA: Insufficient documentation

## 2018-08-10 DIAGNOSIS — D472 Monoclonal gammopathy: Secondary | ICD-10-CM

## 2018-08-10 DIAGNOSIS — Z5181 Encounter for therapeutic drug level monitoring: Secondary | ICD-10-CM | POA: Diagnosis not present

## 2018-08-10 DIAGNOSIS — N400 Enlarged prostate without lower urinary tract symptoms: Secondary | ICD-10-CM | POA: Insufficient documentation

## 2018-08-10 DIAGNOSIS — Z8673 Personal history of transient ischemic attack (TIA), and cerebral infarction without residual deficits: Secondary | ICD-10-CM | POA: Diagnosis not present

## 2018-08-10 LAB — CBC WITH DIFFERENTIAL (CANCER CENTER ONLY)
Abs Immature Granulocytes: 0.01 10*3/uL (ref 0.00–0.07)
Basophils Absolute: 0 10*3/uL (ref 0.0–0.1)
Basophils Relative: 0 %
Eosinophils Absolute: 0.1 10*3/uL (ref 0.0–0.5)
Eosinophils Relative: 2 %
HCT: 38 % — ABNORMAL LOW (ref 39.0–52.0)
Hemoglobin: 11.9 g/dL — ABNORMAL LOW (ref 13.0–17.0)
Immature Granulocytes: 0 %
Lymphocytes Relative: 10 %
Lymphs Abs: 0.6 10*3/uL — ABNORMAL LOW (ref 0.7–4.0)
MCH: 28.5 pg (ref 26.0–34.0)
MCHC: 31.3 g/dL (ref 30.0–36.0)
MCV: 91.1 fL (ref 80.0–100.0)
Monocytes Absolute: 0.3 10*3/uL (ref 0.1–1.0)
Monocytes Relative: 4 %
Neutro Abs: 5.3 10*3/uL (ref 1.7–7.7)
Neutrophils Relative %: 84 %
Platelet Count: 208 10*3/uL (ref 150–400)
RBC: 4.17 MIL/uL — ABNORMAL LOW (ref 4.22–5.81)
RDW: 14.1 % (ref 11.5–15.5)
WBC Count: 6.4 10*3/uL (ref 4.0–10.5)
nRBC: 0 % (ref 0.0–0.2)

## 2018-08-10 LAB — CMP (CANCER CENTER ONLY)
ALT: 19 U/L (ref 0–44)
AST: 25 U/L (ref 15–41)
Albumin: 4.1 g/dL (ref 3.5–5.0)
Alkaline Phosphatase: 95 U/L (ref 38–126)
Anion gap: 10 (ref 5–15)
BUN: 11 mg/dL (ref 8–23)
CO2: 27 mmol/L (ref 22–32)
Calcium: 9.5 mg/dL (ref 8.9–10.3)
Chloride: 105 mmol/L (ref 98–111)
Creatinine: 0.96 mg/dL (ref 0.61–1.24)
GFR, Est AFR Am: >60 mL/min (ref >60)
GFR, Estimated: >60 mL/min (ref >60)
Glucose, Bld: 136 mg/dL — ABNORMAL HIGH (ref 70–99)
Potassium: 4.3 mmol/L (ref 3.5–5.1)
Sodium: 142 mmol/L (ref 135–145)
Total Bilirubin: 0.4 mg/dL (ref 0.3–1.2)
Total Protein: 7.9 g/dL (ref 6.5–8.1)

## 2018-08-10 LAB — LIPID PANEL
Cholesterol: 174 mg/dL (ref 0–200)
HDL: 60 mg/dL (ref >40)
LDL Cholesterol: 84 mg/dL (ref 0–99)
Total CHOL/HDL Ratio: 2.9 RATIO
Triglycerides: 148 mg/dL (ref <150)
VLDL: 30 mg/dL (ref 0–40)

## 2018-08-10 NOTE — Progress Notes (Signed)
[name] Patient Consult   Requesting Kevin: [name], [name],  [place] [id number]   Kevin Buff, Kevin 84 y.o.  04-12-1934    Reason for Consult: Monoclonal gammopathy, IgM   HPI: [name] was referred to [name] in 2015 when he was discovered to have an elevated IgM level while undergoing evaluation for arthritis.  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  He has not undergone any diagnostic bone marrow biopsy.  He last saw [name] in September 2018.  [name] reports feeling well.    Past Medical History:  Diagnosis Date  . Aortic stenosis    moderate aortic stenosis  . Arthritis-osteoarthritis   . Benign prostatic hypertrophy   . 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  . Coronary artery disease    status post multiple prior percutaneous coronary interventions, microvascular angina per Dr [name]  . Heart murmur   . Hypercoagulable state ([name])    chronically anticoagulated with coumadin  . Hyperlipidemia   . Hyperthyroidism   . Hypothyroidism    Dr. Elyse [name]  . MGUS (monoclonal gammopathy of unknown significance) 02/17/2013  . Paroxysmal atrial fibrillation ([place])    DR. Lia [name],   . Prediabetes 09/21/2017  . Stroke [facility])    1990    Past Surgical History:  Procedure Laterality Date  . AORTIC VALVE REPLACEMENT  03/15/2011   Procedure: AORTIC VALVE REPLACEMENT (AVR);  Surgeon: Kevin Pollack, Kevin;  Location: [place];  Service: Open Heart Surgery;  Laterality: N/A;  . APPENDECTOMY    . CARDIAC CATHETERIZATION     11  . CARDIOVERSION    . CARDIOVERSION  04/15/2011   Procedure: CARDIOVERSION;  Surgeon: Kevin Champagne, Kevin;  Location: [place];  Service: Cardiovascular;   Laterality: N/A;  . CARDIOVERSION N/A 09/11/2014   Procedure: CARDIOVERSION;  Surgeon: Kevin Margarita, Kevin;  Location: [facility] ENDOSCOPY;  Service: Cardiovascular;  Laterality: N/A;  . CARDIOVERSION N/A 06/27/2015   Procedure: CARDIOVERSION;  Surgeon: Kevin Headings, Kevin;  [facility] END[facility];  Service: Cardiovascular;  Laterality: N/A;  . CARDIOVERSION N/A 07/04/2015   Procedure: CARDIOVERSION;  Surgeon: Kevin Lance, Kevin;  Location: [place];  Service: Cardiovascular;  Laterality: N/A;  . CARDIOVERSION N/A 04/13/2017   Procedure: CARDIOVERSION;  Surgeon: Kevin Margarita, Kevin;  Location: [facility] ENDOSCOPY;  Service: Cardiovascular;  Laterality: N/A;  . EP IMPLANTABLE DEVICE N/A 06/16/2015   Procedure: Pacemaker Implant;  Surgeon: Kevin Lance, Kevin;  Location: [place] CV LAB;  Service: Cardiovascular;  Laterality: N/A;  . hemrrhoidectomy    . LEFT AND RIGHT HEART CATHETERIZATION WITH CORONARY ANGIOGRAM Bilateral 02/01/2011   Procedure: LEFT AND RIGHT HEART CATHETERIZATION WITH CORONARY ANGIOGRAM;  Surgeon: Kevin Bow, Kevin;  Location: [facility] CATH LAB;  Service: Cardiovascular;  Laterality: Bilateral;  . MAZE  03/15/2011   Procedure: MAZE;  Surgeon: Kevin Pollack, Kevin;  Location: [place];  Service: Open Heart Surgery;  Laterality: N/A;  . PACEMAKER INSERTION  1991   Guidant PPM, most recent Generator Change by Dr [name] was 08/22/06  . RIGHT/LEFT HEART CATH AND CORONARY ANGIOGRAPHY N/A 07/06/2016   Procedure: Right/Left Heart Cath and Coronary Angiography;  Surgeon: Kevin Mocha, Kevin;  Location: [place] CV LAB;  Service: Cardiovascular;  Laterality: N/A;  . TEE  WITHOUT CARDIOVERSION  04/15/2011   Procedure: TRANSESOPHAGEAL ECHOCARDIOGRAM (TEE);  Surgeon: Kevin Champagne, Kevin;  Location: [place];  Service: Cardiovascular;  Laterality: N/A;  . TEE WITHOUT CARDIOVERSION N/A 09/11/2014   Procedure: TRANSESOPHAGEAL ECHOCARDIOGRAM (TEE);  Surgeon: Kevin Margarita, Kevin;  Location: [facility] ENDOSCOPY;  Service:  Cardiovascular;  Laterality: N/A;    Medications: Reviewed  Allergies:  Allergies  Allergen Reactions  . Contrast Media [Iodinated Diagnostic Agents] Hives  . Gadolinium Derivatives Hives  . Metrizamide Hives    Family history: No family history of cancer  Social History:   He lives with his wife in [place].  He is retired Contractor.  He does not use cigarettes or alcohol.  No transfusion history.  No risk factor for HIV or hepatitis.  ROS:   Positives include: He felt warm in the evening beginning in January and lasted for 2 months-resolved, nocturia; pain in the neck, shoulders, and hands  A complete ROS was otherwise negative.  Physical Exam:  Blood pressure (!) 167/113, pulse 79, temperature 98.9 F (37.2 C), temperature source Oral, resp. rate 16, height '6\' 1"'  (1.854 m), SpO2 99 %. Limited physical examination secondary to distancing with the COVID pandemic  HEENT: Neck without mass Abdomen: No hepatosplenomegaly, no mass, nontender  Vascular: No leg edema Lymph nodes: No cervical, supraclavicular, axillary, or inguinal nodes    LAB:  CBC  Lab Results  Component Value Date   WBC 6.4 08/10/2018   HGB 11.9 (L) 08/10/2018   HCT 38.0 (L) 08/10/2018   MCV 91.1 08/10/2018   PLT 208 08/10/2018   NEUTROABS 5.3 08/10/2018        CMP  Lab Results  Component Value Date   NA 140 05/09/2018   K 3.4 (L) 05/09/2018   CL 103 05/09/2018   CO2 29 05/09/2018   GLUCOSE 106 (H) 05/09/2018   BUN 15 05/09/2018   CREATININE 0.88 05/09/2018   CALCIUM 9.4 05/09/2018   PROT 6.7 09/02/2017   ALBUMIN 3.9 09/02/2017   AST 20 09/02/2017   ALT 19 09/02/2017   ALKPHOS 77 09/02/2017   BILITOT <0.2 09/02/2017   GFRNONAA 81 09/02/2017   GFRAA 93 09/02/2017   09/02/2017: IgG 863, IgM 730  Imaging: CT abdomen/pelvis 12/15/2015- unremarkable spleen, no lymphadenopathy    Assessment/Plan:   1. Elevated IgM level dating to 2015  2.  Coronary artery disease 3. BPH 4. Diverticulitis 5. Arrhythmia, status post pacemaker placement 6. History of atrial fibrillation 7. Aortic stenosis, status post aortic valve replacement 8. History of a CVA   Disposition:    KevinAmmon is referred for chronic elevation of the IgM level.  The elevated IgM may be related to early stage Waldenstrom's macroglobulinemia.  The differential diagnosis includes a monoclonal gammopathy of unknown significance.  I have a low clinical suspicion for IgM multiple myeloma.  There is no evidence of an associated condition such as CLL or rheumatoid arthritis.  We will repeat the IgM level and a serum protein electrophoresis/immunofixation today.  We discussed indications for treating Waldenstroms macroglobulinemia such as progressive anemia, "B "symptoms, and bulky lymphadenopathy.  There is no indication for treatment at present.  He will return for an office and lab visit in 1 year.  He will contact us in the interim as needed.  He reports intermittent elevations of his blood pressure.  He says this has been evaluated extensively in the past and his blood pressure usually runs low.  He does not wish to have a repeat blood  pressure today.  He monitors his blood pressure at home.  Kevin Coder, Kevin  08/10/2018, 3:48 PM

## 2018-08-10 NOTE — Telephone Encounter (Signed)
Scheduled appt per 6/11 los. A calendar will be mailed out. °

## 2018-08-10 NOTE — Progress Notes (Signed)
Patient declined weight today. Reports weight at 225.5 lb

## 2018-08-11 LAB — IGG, IGA, IGM
IgA: 157 mg/dL (ref 61–437)
IgG (Immunoglobin G), Serum: 911 mg/dL (ref 603–1613)
IgM (Immunoglobulin M), Srm: 929 mg/dL — ABNORMAL HIGH (ref 15–143)

## 2018-08-11 LAB — KAPPA/LAMBDA LIGHT CHAINS
Kappa free light chain: 24.1 mg/L — ABNORMAL HIGH (ref 3.3–19.4)
Kappa, lambda light chain ratio: 0.68 (ref 0.26–1.65)
Lambda free light chains: 35.4 mg/L — ABNORMAL HIGH (ref 5.7–26.3)

## 2018-08-14 ENCOUNTER — Telehealth: Payer: Self-pay

## 2018-08-14 LAB — IMMUNOFIXATION ELECTROPHORESIS
IgA: 157 mg/dL (ref 61–437)
IgG (Immunoglobin G), Serum: 895 mg/dL (ref 603–1613)
IgM (Immunoglobulin M), Srm: 973 mg/dL — ABNORMAL HIGH (ref 15–143)
Total Protein ELP: 7.3 g/dL (ref 6.0–8.5)

## 2018-08-14 NOTE — Telephone Encounter (Signed)
Received UDS via fax.   Reviewed by Hazel Sams, PA-C. 08/10/2018 consistent with treatment.   Will send document to scan center.

## 2018-08-24 MED FILL — DICLOFENAC 1.5% TOPICAL SOL: 1.5 | 90 days supply | Qty: 450 | Fill #0

## 2018-08-31 ENCOUNTER — Telehealth: Payer: Self-pay | Admitting: Internal Medicine

## 2018-08-31 DIAGNOSIS — R10812 Left upper quadrant abdominal tenderness: Secondary | ICD-10-CM

## 2018-08-31 DIAGNOSIS — K5732 Diverticulitis of large intestine without perforation or abscess without bleeding: Secondary | ICD-10-CM

## 2018-08-31 NOTE — Telephone Encounter (Addendum)
Persistent LLQ tenderness and abdominal pain despite multiple rounds of treatment for suspected diverticulitis.  We will arrange for a CT scan abd/pelvis without contrast 09/06/2018.  He has contrast allergy   DX  LUQ tenderness Diverticulitis  BUN creat were ok 6/11.  He will hopefully be coming back from the beach 7[place]6 - wife was hospitalized for intestinal volvulus and had surgery in [place] and hoping she will be discharged and released to return Monday.

## 2018-08-31 NOTE — Telephone Encounter (Signed)
Left message for patient to call back CT arranged at [facility] for 09/06/18 2:30 arrive at 2:15.

## 2018-09-04 ENCOUNTER — Other Ambulatory Visit: Payer: Self-pay | Admitting: Pulmonary Disease

## 2018-09-04 NOTE — Telephone Encounter (Signed)
I spoke with patient and he is still out of town with his wife that is currently;y admitted.  He will call me back when he returns

## 2018-09-06 ENCOUNTER — Inpatient Hospital Stay: Admission: RE | Admit: 2018-09-06 | Payer: Medicare Other | Source: Ambulatory Visit

## 2018-09-06 DIAGNOSIS — Z20828 Contact with and (suspected) exposure to other viral communicable diseases: Secondary | ICD-10-CM | POA: Diagnosis not present

## 2018-09-08 ENCOUNTER — Other Ambulatory Visit: Payer: Self-pay | Admitting: Physician Assistant

## 2018-09-08 ENCOUNTER — Telehealth: Payer: Self-pay | Admitting: Internal Medicine

## 2018-09-08 ENCOUNTER — Encounter: Payer: Self-pay | Admitting: Internal Medicine

## 2018-09-08 MED ORDER — METRONIDAZOLE 500 MG PO TABS: 500.0000 mg | ORAL_TABLET | Freq: Two times a day (BID) | ORAL | 0 refills | Status: DC

## 2018-09-08 MED ORDER — CIPROFLOXACIN HCL 500 MG PO TABS: 500.0000 mg | ORAL_TABLET | Freq: Two times a day (BID) | ORAL | 0 refills | Status: DC

## 2018-09-08 MED FILL — HYDROCODON-APAP 10-325: 10-325 | 30 days supply | Qty: 60 | Fill #0

## 2018-09-08 MED FILL — METRONIDAZOLE 500 MG TABS: 500 | 14 days supply | Qty: 28 | Fill #0

## 2018-09-08 MED FILL — CIPROFLOXACIN HCL 500 MG TA: 500 | 14 days supply | Qty: 28 | Fill #0

## 2018-09-08 MED FILL — POTASSIUM CHLORIDE CRYS ER: 20 | 30 days supply | Qty: 60 | Fill #3

## 2018-09-08 MED FILL — BUTALBITAL-APAP-CAFFEINE 50: 50-325-40 | 90 days supply | Qty: 180 | Fill #1

## 2018-09-08 NOTE — Telephone Encounter (Signed)
He left message  His wife remained in hospital a while in [place] so he missed his CT  Please reschedule CT abd/pelvis with contrast - see if he wants to do it this weekend if they can at [facility] and do that otherwise Monday or early next week should be ok  Also send in cipro 500 mh bid and metronidazole 500 mg bid x 14 days no refill

## 2018-09-08 NOTE — Telephone Encounter (Signed)
Patient notified of the recommendations He will have the CT scan on 09/12/18 at [facility].  He is asked to arrive at 8:45 .  He is asked to come pick up his contrast and instructions at the 3rd floor.  Rx sent to the pharmacy

## 2018-09-08 NOTE — Telephone Encounter (Signed)
I cannot send refill through [facility] currently. Please send to Dr. Estanislado Pandy or call in verbal.

## 2018-09-08 NOTE — Telephone Encounter (Signed)
Last Visit: 08/08/18 Next Visit: due in December 2020 UDS: 08/10/18 Narc Agreement: 11/16/17  Okay to refill Hydrocodone?

## 2018-09-12 ENCOUNTER — Other Ambulatory Visit (HOSPITAL_COMMUNITY)
Admission: RE | Admit: 2018-09-12 | Discharge: 2018-09-12 | Disposition: A | Discharge status: Home / Self Care | Payer: Medicare Other | Source: Ambulatory Visit | Attending: Pulmonary Disease | Admitting: Pulmonary Disease

## 2018-09-12 ENCOUNTER — Other Ambulatory Visit: Payer: Self-pay

## 2018-09-12 ENCOUNTER — Ambulatory Visit (INDEPENDENT_AMBULATORY_CARE_PROVIDER_SITE_OTHER)
Admission: RE | Admit: 2018-09-12 | Discharge: 2018-09-12 | Disposition: A | Discharge status: Home / Self Care | Payer: Medicare Other | Source: Ambulatory Visit | Attending: Internal Medicine | Admitting: Internal Medicine

## 2018-09-12 DIAGNOSIS — R10812 Left upper quadrant abdominal tenderness: Secondary | ICD-10-CM

## 2018-09-12 DIAGNOSIS — R10814 Left lower quadrant abdominal tenderness: Secondary | ICD-10-CM | POA: Diagnosis not present

## 2018-09-12 DIAGNOSIS — K5732 Diverticulitis of large intestine without perforation or abscess without bleeding: Secondary | ICD-10-CM | POA: Diagnosis not present

## 2018-09-12 NOTE — Progress Notes (Signed)
I s[poke with [name] to inquire his arrival for Covid 19 screening today. He stated he has canceled his PFT and does not need Covid testing at this time.

## 2018-09-12 NOTE — Progress Notes (Signed)
I reviewed results of mild diverticulitis I have asked him to see me in person 7/23 at 1130 in person Please make that appt and remind him

## 2018-09-20 ENCOUNTER — Telehealth: Payer: Self-pay

## 2018-09-20 NOTE — Telephone Encounter (Signed)
Covid-19 screening questions   Do you now or have you had a fever in the last 14 days? No  Do you have any respiratory symptoms of shortness of breath or cough now or in the last 14 days? No  Do you have any family members or close contacts with diagnosed or suspected Covid-19 in the past 14 days? No  Have you been tested for Covid-19 and found to be positive? Was tested on the 7th and as found to be negative

## 2018-09-21 ENCOUNTER — Encounter: Payer: Self-pay | Admitting: Internal Medicine

## 2018-09-21 ENCOUNTER — Ambulatory Visit (INDEPENDENT_AMBULATORY_CARE_PROVIDER_SITE_OTHER): Payer: Medicare Other | Admitting: Internal Medicine

## 2018-09-21 VITALS — BP 134/84 | HR 80 | Temp 97.8°F | Ht 73.0 in | Wt 221.0 lb

## 2018-09-21 DIAGNOSIS — K5732 Diverticulitis of large intestine without perforation or abscess without bleeding: Secondary | ICD-10-CM

## 2018-09-21 DIAGNOSIS — K5792 Diverticulitis of intestine, part unspecified, without perforation or abscess without bleeding: Secondary | ICD-10-CM | POA: Diagnosis not present

## 2018-09-21 NOTE — Patient Instructions (Signed)
Great to see you today. Please follow up with Dr Carlean Purl as needed.   I appreciate the opportunity to care for you. Silvano Rusk, MD, [facility]

## 2018-09-21 NOTE — Progress Notes (Signed)
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  . Diverticulitis of colon Yes  . Diverticulitis sigmoid colon recurrent      He has recurrent sigmoid diverticulitis problems and probably has symptomatic diverticulosis as well.  He will contact me as needed, at his age and with his comorbidities I do not think intervention with surgery makes sense and he agrees.  We will try not to overuse antibiotics.  Subjective:   Chief Complaint: Follow-up of diverticulitis  HPI [name] presents today for follow-up.  He struggled off and on with abdominal pain in the left lower quadrant and recurrent diverticulitis.  He uses antibiotics intermittently.  They generally work.  He will have some chronic mild tenderness but then flares where it is worse.  He has a regimen of Peri-Colace Colace and Dulcolax that he takes every other day which promotes regular defecation.  We had some communication by phone recently he had been having a flare while his wife was hospitalized in [place], she unfortunately developed a small bowel obstruction and required a resection and then had a postoperative abscess.  He was treated with Cipro and metronidazole and feels much better though he is still mildly tender in the left lower quadrant but is chronically so.  He moves his bowels as mentioned.  He definitely feels improved.  I had him do a CT scan because it has been a while since we have had 1 and I wanted to rule out abscess and he had some mild stranding around the distal sigmoid colon, we reviewed that in person today the images.  He cannot have IV contrast because of allergy.  We would have to premedicate but have not done so. Allergies  Allergen Reactions  . Contrast Media [Iodinated Diagnostic Agents] Hives  . Gadolinium Derivatives Hives  . Metrizamide Hives   Current Meds  Medication Sig  . bisacodyl (DULCOLAX) 5 MG EC tablet Take 5 mg by mouth  as needed (constipation).   . butalbital-acetaminophen-caffeine (FIORICET, ESGIC) 50-325-40 MG per tablet Take 1 tablet by mouth 2 (two) times daily as needed (osteo-arthritis pain).   . Cholecalciferol (VITAMIN D3) 2000 units capsule Take 1,000 Units by mouth daily.   . Coenzyme Q10 100 MG capsule Take 1 tablet once a day  . Diclofenac Sodium 1.5 % SOLN Take 1 application by mouth 2 (two) times daily as needed (Knee pain).   Marland Kitchen doxazosin (CARDURA) 4 MG tablet Take 4 mg by mouth at bedtime.   Marland Kitchen ELIQUIS 5 MG TABS tablet TAKE 1 TABLET BY MOUTH 2 TIMES DAILY.  Marland Kitchen HYDROcodone-acetaminophen (NORCO) 10-325 MG tablet TAKE 1 TABLET BY MOUTH TWICE DAILY AS NEEDED FOR MODERATE PAIN  . LORazepam (ATIVAN) 1 MG tablet Take 1 mg by mouth at bedtime.   . magnesium oxide (MAG-OX) 400 MG tablet Take 400 mg by mouth daily.  . metoprolol succinate (TOPROL-XL) 100 MG 24 hr tablet TAKE 1 TABLET BY MOUTH IN THE MORNING AND 1 TABLET BY MOUTH AT NIGHT. TAKE WITH OR IMMEDIATELY FOLLOWING A MEAL.  Marland Kitchen OVER THE COUNTER MEDICATION Apply 1 patch topically daily as needed (neck and shoulder pain). Chinese pain patch  . potassium chloride SA (K-DUR,KLOR-CON) 20 MEQ tablet TAKE 2 TABLETS BY MOUTH DAILY.  Marland Kitchen predniSONE (DELTASONE) 10 MG tablet 4 tabs x's 3 days,3tabs x's 3days,2tabs x's 3 days,1 tab x's 3 days,then stop  . rosuvastatin (CRESTOR) 20 MG tablet TAKE 1 TABLET BY MOUTH  AT BEDTIME.   Past Medical History:  Diagnosis Date  . Aortic stenosis    moderate aortic stenosis  . Arthritis   . Benign prostatic hypertrophy   . 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  . Coronary artery disease    status post multiple prior percutaneous coronary interventions, microvascular angina per Dr Olevia Perches  . Diverticulitis sigmoid colon recurrent   . Heart murmur   . Hypercoagulable  state ([place])    chronically anticoagulated with coumadin  . Hyperlipidemia   . Hyperthyroidism   . Hypothyroidism    Dr. Elyse Hsu  . MGUS (monoclonal gammopathy of unknown significance) 02/17/2013  . Paroxysmal atrial fibrillation ([place])    DR. Lia Foyer,   . Prediabetes 09/21/2017  . Stroke [facility])    1990   Past Surgical History:  Procedure Laterality Date  . AORTIC VALVE REPLACEMENT  03/15/2011   Procedure: AORTIC VALVE REPLACEMENT (AVR);  Surgeon: Gaye Pollack, MD;  Location: [place];  Service: Open Heart Surgery;  Laterality: N/A;  . APPENDECTOMY    . CARDIAC CATHETERIZATION     11  . CARDIOVERSION    . CARDIOVERSION  04/15/2011   Procedure: CARDIOVERSION;  Surgeon: Loralie Champagne, MD;  Location: [place];  Service: Cardiovascular;  Laterality: N/A;  . CARDIOVERSION N/A 09/11/2014   Procedure: CARDIOVERSION;  Surgeon: Sueanne Margarita, MD;  Location: [facility] ENDOSCOPY;  Service: Cardiovascular;  Laterality: N/A;  . CARDIOVERSION N/A 06/27/2015   Procedure: CARDIOVERSION;  Surgeon: Thayer Headings, MD;  Location: [facility] ENDOSCOPY;  Service: Cardiovascular;  Laterality: N/A;  . CARDIOVERSION N/A 07/04/2015   Procedure: CARDIOVERSION;  Surgeon: Evans Lance, MD;  Location: [place];  Service: Cardiovascular;  Laterality: N/A;  . CARDIOVERSION N/A 04/13/2017   Procedure: CARDIOVERSION;  Surgeon: Sueanne Margarita, MD;  Location: [facility] ENDOSCOPY;  Service: Cardiovascular;  Laterality: N/A;  . COLONOSCOPY    . EP IMPLANTABLE DEVICE N/A 06/16/2015   Procedure: Pacemaker Implant;  Surgeon: Evans Lance, MD;  Location: [place] CV LAB;  Service: Cardiovascular;  Laterality: N/A;  . hemrrhoidectomy    . LEFT AND RIGHT HEART CATHETERIZATION WITH CORONARY ANGIOGRAM Bilateral 02/01/2011   Procedure: LEFT AND RIGHT HEART CATHETERIZATION WITH CORONARY ANGIOGRAM;  Surgeon: Hillary Bow, MD;  Location: [facility] CATH LAB;  Service: Cardiovascular;  Laterality: Bilateral;  . MAZE  03/15/2011   Procedure: MAZE;   Surgeon: Gaye Pollack, MD;  Location: [place];  Service: Open Heart Surgery;  Laterality: N/A;  . PACEMAKER INSERTION  1991   Guidant PPM, most recent Generator Change by Dr Olevia Perches was 08/22/06  . RIGHT/LEFT HEART CATH AND CORONARY ANGIOGRAPHY N/A 07/06/2016   Procedure: Right/Left Heart Cath and Coronary Angiography;  Surgeon: Sherren Mocha, MD;  Location: [place] CV LAB;  Service: Cardiovascular;  Laterality: N/A;  . TEE WITHOUT CARDIOVERSION  04/15/2011   Procedure: TRANSESOPHAGEAL ECHOCARDIOGRAM (TEE);  Surgeon: Loralie Champagne, MD;  Location: [place];  Service: Cardiovascular;  Laterality: N/A;  . TEE WITHOUT CARDIOVERSION N/A 09/11/2014   Procedure: TRANSESOPHAGEAL ECHOCARDIOGRAM (TEE);  Surgeon: Sueanne Margarita, MD;  Location: [facility] ENDOSCOPY;  Service: Cardiovascular;  Laterality: N/A;   Social History   Social History Narrative   Married to [place]. Has grown children   Retired Horticulturist, commercial MD      Never smoker no alcohol      family history includes Heart disease  in his brother.   Review of Systems As above  Objective:   Physical Exam BP 134/84 (BP Location: Left Arm, Patient Position: Sitting, Cuff Size: Normal)   Pulse 80   Temp 97.8 F (36.6 C)   Ht 6\' 1"  (1.854 m)   Wt 221 lb (100.2 kg)   BMI 29.16 kg/m  Elderly white man in no acute distress There is mild left lower quadrant tenderness to deep palpation but no mass-effect or other concerning signs on abdominal exam  15 minutes time spent with patient > half in counseling coordination of care

## 2018-10-04 ENCOUNTER — Other Ambulatory Visit: Payer: Self-pay | Admitting: Rheumatology

## 2018-10-04 MED FILL — LORazepam 1 MG TABS: 1 | 60 days supply | Qty: 60 | Fill #2

## 2018-10-04 MED FILL — DICLOFENAC 1.5% TOPICAL SOL: 1.5 | 90 days supply | Qty: 450 | Fill #0

## 2018-10-04 NOTE — Telephone Encounter (Signed)
Last Visit: 08/08/18 Next Visit: due in December 2020 UDS: 08/10/18 Narc Agreement: 11/16/17 Last Fill: 10/04/18  Okay to refill Hydrocodone?

## 2018-10-11 MED FILL — LORazepam 1 MG TABS: 1 | 60 days supply | Qty: 60 | Fill #3

## 2018-10-23 ENCOUNTER — Telehealth: Payer: Self-pay | Admitting: Internal Medicine

## 2018-10-23 MED FILL — HYDROCODON-APAP 10-325: 10-325 | 30 days supply | Qty: 60 | Fill #0

## 2018-10-23 MED FILL — METOPROLOL SUCCINATE ER 100: 100 | 90 days supply | Qty: 180 | Fill #1

## 2018-10-23 NOTE — Telephone Encounter (Signed)
New message  Patient calling back in to speak with nurse about appointment on 10/25/18, patient states that he needs to reschedule, offered next soonest available with Dr Lovena Le on 01/17/19, patient declined. Declined scheduling with any APP. Asking to speak with Lorren. Please give patient a call back.

## 2018-10-23 NOTE — Telephone Encounter (Signed)
Call returned to Dr. Velora Heckler.  Rescheduled visit as requested.

## 2018-10-26 ENCOUNTER — Encounter: Payer: Medicare Other | Admitting: Internal Medicine

## 2018-10-31 ENCOUNTER — Other Ambulatory Visit: Payer: Self-pay

## 2018-10-31 ENCOUNTER — Ambulatory Visit (INDEPENDENT_AMBULATORY_CARE_PROVIDER_SITE_OTHER): Payer: Medicare Other | Admitting: Internal Medicine

## 2018-10-31 ENCOUNTER — Encounter: Payer: Self-pay | Admitting: Internal Medicine

## 2018-10-31 VITALS — BP 134/90 | HR 87 | Ht 73.0 in | Wt 225.0 lb

## 2018-10-31 DIAGNOSIS — I495 Sick sinus syndrome: Secondary | ICD-10-CM | POA: Diagnosis not present

## 2018-10-31 DIAGNOSIS — I2510 Atherosclerotic heart disease of native coronary artery without angina pectoris: Secondary | ICD-10-CM | POA: Diagnosis not present

## 2018-10-31 DIAGNOSIS — I480 Paroxysmal atrial fibrillation: Secondary | ICD-10-CM | POA: Diagnosis not present

## 2018-10-31 DIAGNOSIS — I4819 Other persistent atrial fibrillation: Secondary | ICD-10-CM

## 2018-10-31 DIAGNOSIS — Z950 Presence of cardiac pacemaker: Secondary | ICD-10-CM

## 2018-10-31 MED ORDER — EZETIMIBE 10 MG PO TABS: 10.0000 mg | ORAL_TABLET | Freq: Every day | ORAL | 3 refills | Status: DC

## 2018-10-31 MED ORDER — METOPROLOL TARTRATE 50 MG PO TABS: ORAL_TABLET | ORAL | 2 refills | Status: DC

## 2018-10-31 MED ORDER — METOPROLOL SUCCINATE ER 100 MG PO TB24: ORAL_TABLET | ORAL | 3 refills | Status: DC

## 2018-10-31 MED FILL — EZETIMIBE 10 MG TABS: 10 | 90 days supply | Qty: 90 | Fill #0

## 2018-10-31 MED FILL — METOPROLOL TARTRATE 50 MG T: 50 | 20 days supply | Qty: 60 | Fill #0

## 2018-10-31 NOTE — Progress Notes (Signed)
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 He is a pleasant 84 yo man with persistent left atrial flutter, sinus node dysfunction, amio induced thyroid dysfunction, resolved off amiodarone. He has reduced the dose of his toprol and he feels a bit more energy. He has minimal chest pain. No other complaints.  Allergies  Allergen Reactions  . Contrast Media [Iodinated Diagnostic Agents] Hives  . Gadolinium Derivatives Hives  . Metrizamide Hives     Current Outpatient Medications  Medication Sig Dispense Refill  . beclomethasone (QVAR) 80 MCG/ACT inhaler Inhale 2 puffs into the lungs 2 (two) times daily. 1 Inhaler 6  . bisacodyl (DULCOLAX) 5 MG EC tablet Take 5 mg by mouth as needed (constipation).     . budesonide (PULMICORT FLEXHALER) 180 MCG/ACT inhaler Inhale 2 puffs into the lungs 2 (two) times daily. 1 each 6  . butalbital-acetaminophen-caffeine (FIORICET, ESGIC) 50-325-40 MG per tablet Take 1 tablet by mouth 2 (two) times daily as needed (osteo-arthritis pain).     . Cholecalciferol (VITAMIN D3) 2000 units capsule Take 1,000 Units by mouth daily.     . Coenzyme Q10 100 MG capsule Take 1 tablet once a day    . Diclofenac Sodium 1.5 % SOLN Take 1 application by mouth 2 (two) times daily as needed (Knee pain).     Marland Kitchen doxazosin (CARDURA) 4 MG tablet Take 4 mg by mouth at bedtime.     Marland Kitchen ELIQUIS 5 MG TABS tablet TAKE 1 TABLET BY MOUTH 2 TIMES DAILY. 180 tablet 3  . HYDROcodone-acetaminophen (NORCO) 10-325 MG tablet TAKE 1 TABLET BY MOUTH TWICE DAILY AS NEEDED FOR MODERATE PAIN 60 tablet 0  . LORazepam (ATIVAN) 1 MG tablet Take 1 mg by mouth at bedtime.     . magnesium oxide (MAG-OX) 400 MG tablet Take 400 mg by mouth daily.    . metoprolol tartrate (LOPRESSOR) 50 MG tablet Take 50 mg by mouth 2 (two) times daily.    . nitroGLYCERIN (NITROSTAT) 0.4 MG SL tablet Place 1 tablet (0.4 mg total) under the tongue every 5 (five) minutes as needed for chest pain. 30 tablet 1  . OVER THE COUNTER  MEDICATION Apply 1 patch topically daily as needed (neck and shoulder pain). Chinese pain patch    . potassium chloride SA (K-DUR,KLOR-CON) 20 MEQ tablet TAKE 2 TABLETS BY MOUTH DAILY. 60 tablet 10  . predniSONE (DELTASONE) 10 MG tablet 4 tabs x's 3 days,3tabs x's 3days,2tabs x's 3 days,1 tab x's 3 days,then stop 30 tablet 0  . rosuvastatin (CRESTOR) 20 MG tablet TAKE 1 TABLET BY MOUTH AT BEDTIME. 90 tablet 3   No current facility-administered medications for this visit.      Past Medical History:  Diagnosis Date  . Aortic stenosis    moderate aortic stenosis  . Arthritis   . Benign prostatic hypertrophy   . 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  . Coronary artery disease    status post multiple prior percutaneous coronary interventions, microvascular angina per Dr Olevia Perches  . Diverticulitis sigmoid colon recurrent   . Heart murmur   . Hypercoagulable state ([place])    chronically anticoagulated with coumadin  . Hyperlipidemia   . Hyperthyroidism   . Hypothyroidism    Dr. Elyse Hsu  . MGUS (monoclonal gammopathy of unknown significance) 02/17/2013  . Paroxysmal atrial fibrillation (HCC)  DR. Lia Foyer,   . Prediabetes 09/21/2017  . Stroke ([place])    1990    ROS:   All systems reviewed and negative except as noted in the HPI.   Past Surgical History:  Procedure Laterality Date  . AORTIC VALVE REPLACEMENT  03/15/2011   Procedure: AORTIC VALVE REPLACEMENT (AVR);  Surgeon: Gaye Pollack, MD;  Location: [place];  Service: Open Heart Surgery;  Laterality: N/A;  . APPENDECTOMY    . CARDIAC CATHETERIZATION     11  . CARDIOVERSION    . CARDIOVERSION  04/15/2011   Procedure: CARDIOVERSION;  Surgeon: Loralie Champagne, MD;  Location: [place];  Service: Cardiovascular;  Laterality: N/A;  . CARDIOVERSION N/A 09/11/2014   Procedure:  CARDIOVERSION;  Surgeon: Sueanne Margarita, MD;  Location: [facility] ENDOSCOPY;  Service: Cardiovascular;  Laterality: N/A;  . CARDIOVERSION N/A 06/27/2015   Procedure: CARDIOVERSION;  Surgeon: Thayer Headings, MD;  Location: [facility] ENDOSCOPY;  Service: Cardiovascular;  Laterality: N/A;  . CARDIOVERSION N/A 07/04/2015   Procedure: CARDIOVERSION;  Surgeon: Evans Lance, MD;  Location: [place];  Service: Cardiovascular;  Laterality: N/A;  . CARDIOVERSION N/A 04/13/2017   Procedure: CARDIOVERSION;  Surgeon: Sueanne Margarita, MD;  Location: [facility] ENDOSCOPY;  Service: Cardiovascular;  Laterality: N/A;  . COLONOSCOPY    . EP IMPLANTABLE DEVICE N/A 06/16/2015   Procedure: Pacemaker Implant;  Surgeon: Evans Lance, MD;  Location: [place] CV LAB;  Service: Cardiovascular;  Laterality: N/A;  . hemrrhoidectomy    . LEFT AND RIGHT HEART CATHETERIZATION WITH CORONARY ANGIOGRAM Bilateral 02/01/2011   Procedure: LEFT AND RIGHT HEART CATHETERIZATION WITH CORONARY ANGIOGRAM;  Surgeon: Hillary Bow, MD;  Location: [facility] CATH LAB;  Service: Cardiovascular;  Laterality: Bilateral;  . MAZE  03/15/2011   Procedure: MAZE;  Surgeon: Gaye Pollack, MD;  Location: [place];  Service: Open Heart Surgery;  Laterality: N/A;  . PACEMAKER INSERTION  1991   Guidant PPM, most recent Generator Change by Dr Olevia Perches was 08/22/06  . RIGHT/LEFT HEART CATH AND CORONARY ANGIOGRAPHY N/A 07/06/2016   Procedure: Right/Left Heart Cath and Coronary Angiography;  Surgeon: Sherren Mocha, MD;  Location: [place] CV LAB;  Service: Cardiovascular;  Laterality: N/A;  . TEE WITHOUT CARDIOVERSION  04/15/2011   Procedure: TRANSESOPHAGEAL ECHOCARDIOGRAM (TEE);  Surgeon: Loralie Champagne, MD;  Location: [place];  Service: Cardiovascular;  Laterality: N/A;  . TEE WITHOUT CARDIOVERSION N/A 09/11/2014   Procedure: TRANSESOPHAGEAL ECHOCARDIOGRAM (TEE);  Surgeon: Sueanne Margarita, MD;  Location: [facility] ENDOSCOPY;  Service: Cardiovascular;  Laterality: N/A;     Family  History  Problem Relation Age of Onset  . Heart disease Brother        Twin brother has coronary disease and recent AVR for AS  . Anesthesia problems Neg Hx   . Hypotension Neg Hx   . Malignant hyperthermia Neg Hx   . Pseudochol deficiency Neg Hx      Social History   Socioeconomic History  . Marital status: Married    Spouse name: Not on file  . Number of children: Not on file  . Years of education: Not on file  . Highest education level: Not on file  Occupational History  . Occupation: Retired    Comment: Physician  Social Needs  . Financial resource strain: Not on file  . Food insecurity    Worry: Not on file    Inability: Not on file  . Transportation needs    Medical: Not on file  Non-medical: Not on file  Tobacco Use  . Smoking status: Never Smoker  . Smokeless tobacco: Never Used  Substance and Sexual Activity  . Alcohol use: No    Alcohol/week: 0.0 standard drinks  . Drug use: No  . Sexual activity: Not on file  Lifestyle  . Physical activity    Days per week: Not on file    Minutes per session: Not on file  . Stress: Not on file  Relationships  . Social Herbalist on phone: Not on file    Gets together: Not on file    Attends religious service: Not on file    Active member of club or organization: Not on file    Attends meetings of clubs or organizations: Not on file    Relationship status: Not on file  . Intimate partner violence    Fear of current or ex partner: Not on file    Emotionally abused: Not on file    Physically abused: Not on file    Forced sexual activity: Not on file  Other Topics Concern  . Not on file  Social History Narrative   Married to [place]. Has grown children   Retired Horticulturist, commercial MD      Never smoker no alcohol        BP 134/90   Pulse 87   Ht 6\' 1"  (1.854 m)   Wt 225 lb (102.1 kg)   SpO2 97%   BMI 29.69 kg/m   Physical Exam:  Well appearing NAD HEENT: Unremarkable Neck:  No JVD, no thyromegally  Lymphatics:  No adenopathy Back:  No CVA tenderness Lungs:  Clear with no wheezes HEART:  IRegular rate rhythm, no murmurs, no rubs, no clicks Abd:  soft, positive bowel sounds, no organomegally, no rebound, no guarding Ext:  2 plus pulses, no edema, no cyanosis, no clubbing Skin:  No rashes no nodules Neuro:  CN II through XII intact, motor grossly intact  DEVICE  Normal device function.  See PaceArt for details.   Assess/Plan: 1. Left atrial flutter - his rate is well controlled. He will undergo watchful waiting. I have prescribed short acting metoprolol so that the can take something if his rate and/or bp increase.  2. PPM - his [employer] DDD PPM is working normally. Histograms looks good. Thresholds are good. 3. Coags - he has had no bleeding on Eliquis. Continue. 4. Dyslipidemia - he has been maintained on crestor. His LDL in the past has been above 70. We will precribe Zetia.   [name].D.

## 2018-10-31 NOTE — Patient Instructions (Addendum)
Medication Instructions:  Your physician has recommended you make the following change in your medication:   1.  Start taking Zetia 10 mg one tablet by mouth daily   Labwork: None ordered.  Testing/Procedures: None ordered.  Follow-Up: Your physician wants you to follow-up in:   6 months with Dr. Lovena Le.   Any Other Special Instructions Will Be Listed Below (If Applicable).  If you need a refill on your cardiac medications before your next appointment, please call your pharmacy.

## 2018-11-02 DIAGNOSIS — H52203 Unspecified astigmatism, bilateral: Secondary | ICD-10-CM | POA: Diagnosis not present

## 2018-11-02 DIAGNOSIS — H532 Diplopia: Secondary | ICD-10-CM | POA: Diagnosis not present

## 2018-11-02 DIAGNOSIS — H25813 Combined forms of age-related cataract, bilateral: Secondary | ICD-10-CM | POA: Diagnosis not present

## 2018-11-03 ENCOUNTER — Ambulatory Visit (INDEPENDENT_AMBULATORY_CARE_PROVIDER_SITE_OTHER): Payer: Medicare Other | Admitting: *Deleted

## 2018-11-03 DIAGNOSIS — I495 Sick sinus syndrome: Secondary | ICD-10-CM

## 2018-11-06 LAB — CUP PACEART REMOTE DEVICE CHECK
Battery Remaining Longevity: 102 mo
Battery Remaining Percentage: 97 %
Brady Statistic RA Percent Paced: 0 %
Brady Statistic RV Percent Paced: 31 %
Date Time Interrogation Session: 20200905131700
Implantable Lead Implant Date: 20170417
Implantable Lead Implant Date: 20170417
Implantable Lead Location: 753859
Implantable Lead Location: 753860
Implantable Lead Model: 7740
Implantable Lead Model: 7741
Implantable Lead Serial Number: 662696
Implantable Lead Serial Number: 751382
Implantable Pulse Generator Implant Date: 20170417
Lead Channel Impedance Value: 659 Ohm
Lead Channel Impedance Value: 734 Ohm
Lead Channel Setting Pacing Amplitude: 2.5 V
Lead Channel Setting Pacing Amplitude: 2.5 V
Lead Channel Setting Pacing Pulse Width: 0.4 ms
Lead Channel Setting Sensing Sensitivity: 2.5 mV
Pulse Gen Serial Number: 718418

## 2018-11-07 ENCOUNTER — Other Ambulatory Visit: Payer: Self-pay | Admitting: Internal Medicine

## 2018-11-07 ENCOUNTER — Other Ambulatory Visit: Payer: Self-pay | Admitting: Pulmonary Disease

## 2018-11-07 MED FILL — ELIQUIS 5 MG TABLET: 5 | 90 days supply | Qty: 180 | Fill #0

## 2018-11-07 MED FILL — DOXAZOSIN MESYLATE 4 MG TAB: 4 | 90 days supply | Qty: 90 | Fill #2

## 2018-11-07 MED FILL — ROSUVASTATIN CALCIUM 20 MG: 20 | 90 days supply | Qty: 90 | Fill #2 | Status: TO

## 2018-11-07 MED FILL — predniSONE 10 MG TABS: 10 | 12 days supply | Qty: 30 | Fill #0

## 2018-11-07 NOTE — Telephone Encounter (Signed)
Appropriate for refill

## 2018-11-07 NOTE — Telephone Encounter (Signed)
Age 84, weight 102kg, SCr 0.96 on 08/10/18, last OV 10/31/18, afib indication

## 2018-11-10 DIAGNOSIS — M19011 Primary osteoarthritis, right shoulder: Secondary | ICD-10-CM | POA: Diagnosis not present

## 2018-11-10 DIAGNOSIS — M19012 Primary osteoarthritis, left shoulder: Secondary | ICD-10-CM | POA: Diagnosis not present

## 2018-11-10 DIAGNOSIS — M542 Cervicalgia: Secondary | ICD-10-CM | POA: Diagnosis not present

## 2018-11-15 ENCOUNTER — Encounter: Payer: Self-pay | Admitting: Cardiology

## 2018-11-15 NOTE — Progress Notes (Signed)
Remote pacemaker transmission.   

## 2018-11-29 ENCOUNTER — Other Ambulatory Visit: Payer: Self-pay | Admitting: Rheumatology

## 2018-11-29 MED FILL — HYDROCODON-APAP 10-325: 10-325 | 30 days supply | Qty: 60 | Fill #0

## 2018-11-29 NOTE — Telephone Encounter (Signed)
Last Visit: 08/08/18 Next Visit: due in December 2020 UDS: 08/10/18 Narc Agreement: 11/16/17  Okay to refill Hydrocodone?  

## 2018-11-30 ENCOUNTER — Other Ambulatory Visit: Payer: Self-pay

## 2018-11-30 DIAGNOSIS — Z20822 Contact with and (suspected) exposure to covid-19: Secondary | ICD-10-CM

## 2018-12-01 LAB — NOVEL CORONAVIRUS, NAA: SARS-CoV-2, NAA: NOT DETECTED

## 2018-12-06 MED FILL — BUTALBITAL-APAP-CAFFEINE 50: 50-325-40 | 90 days supply | Qty: 180 | Fill #2

## 2018-12-14 DIAGNOSIS — Z23 Encounter for immunization: Secondary | ICD-10-CM | POA: Diagnosis not present

## 2018-12-22 ENCOUNTER — Other Ambulatory Visit: Payer: Self-pay | Admitting: Rheumatology

## 2018-12-22 DIAGNOSIS — N401 Enlarged prostate with lower urinary tract symptoms: Secondary | ICD-10-CM | POA: Diagnosis not present

## 2018-12-22 DIAGNOSIS — R3914 Feeling of incomplete bladder emptying: Secondary | ICD-10-CM | POA: Diagnosis not present

## 2018-12-22 NOTE — Telephone Encounter (Signed)
Last Visit: 08/08/18 Next Visit: due in December 2020 UDS: 08/10/18 Narc Agreement: 11/16/17  Okay to refill Hydrocodone?  

## 2018-12-26 MED FILL — HYDROCODON-APAP 10-325: 10-325 | 30 days supply | Qty: 60 | Fill #0

## 2019-01-19 ENCOUNTER — Other Ambulatory Visit: Payer: Self-pay | Admitting: Rheumatology

## 2019-01-19 MED FILL — ELIQUIS 5 MG TABLET: 5 | 90 days supply | Qty: 180 | Fill #1

## 2019-01-19 MED FILL — LORazepam 1 MG TABS: 1 | 60 days supply | Qty: 60 | Fill #1

## 2019-01-19 MED FILL — DOXAZOSIN MESYLATE 4 MG TAB: 4 | 90 days supply | Qty: 90 | Fill #3

## 2019-01-19 MED FILL — ROSUVASTATIN CALCIUM 20 MG: 20 | 90 days supply | Qty: 90 | Fill #3 | Status: TO

## 2019-01-19 MED FILL — EZETIMIBE 10 MG TABS: 10 | 90 days supply | Qty: 90 | Fill #1

## 2019-01-19 MED FILL — METOPROLOL SUCCINATE ER 100: 100 | 90 days supply | Qty: 180 | Fill #2

## 2019-01-19 NOTE — Telephone Encounter (Signed)
He needs an appointment, UDS and narcotic agreement renewed.  This will be the last medication refill I will do.  Please advise that 3 will be able to refill any more medications after this phone until all above is met.

## 2019-01-19 NOTE — Telephone Encounter (Signed)
Last Visit: 08/08/2018 Next Visit: message sent to the front desk to schedule.  UDS:08/10/2018 Narc Agreement: 11/16/2017  Last fill: 12/22/2018   Okay to refill hydrocodone?

## 2019-01-22 NOTE — Telephone Encounter (Signed)
Attempted to contact patient and left message on machine to advise patient to call the office.  

## 2019-01-23 ENCOUNTER — Telehealth: Payer: Self-pay | Admitting: Rheumatology

## 2019-01-23 NOTE — Telephone Encounter (Signed)
Patient states he went to [facility] on Saturday, 11/21 to pick up his prescription of Hydrocodone and states the pharmacy has not received the refill request from Dr. Estanislado Pandy.  Patient states he will be going out of town for 2 weeks and would like to pick up his prescription today.

## 2019-01-23 NOTE — Telephone Encounter (Signed)
Spoke with pharmacy and they have received the prescription but cannot refill until 01/26/2019. I advised patient and patient verbalized understanding. Also advised patient he is due to update UDS/narc agreement, he is currently out of town.

## 2019-01-24 MED FILL — HYDROCODON-APAP 10-325: 10-325 | 30 days supply | Qty: 60 | Fill #0

## 2019-01-29 ENCOUNTER — Telehealth: Payer: Self-pay | Admitting: Cardiovascular Disease

## 2019-01-29 NOTE — Telephone Encounter (Signed)
Per Dr. Burt Knack, scheduled patient for evaluation on 12/2. The patient was grateful for assistance.

## 2019-01-29 NOTE — Telephone Encounter (Signed)
New Message:   Dr Cornelia Copa would like for you or Dr Burt Knack to please call him.

## 2019-01-30 ENCOUNTER — Other Ambulatory Visit: Payer: Self-pay

## 2019-01-30 DIAGNOSIS — Z20828 Contact with and (suspected) exposure to other viral communicable diseases: Secondary | ICD-10-CM | POA: Diagnosis not present

## 2019-01-30 DIAGNOSIS — Z20822 Contact with and (suspected) exposure to covid-19: Secondary | ICD-10-CM

## 2019-01-31 ENCOUNTER — Other Ambulatory Visit: Payer: Self-pay

## 2019-01-31 ENCOUNTER — Encounter: Payer: Self-pay | Admitting: Cardiovascular Disease

## 2019-01-31 ENCOUNTER — Ambulatory Visit (INDEPENDENT_AMBULATORY_CARE_PROVIDER_SITE_OTHER): Payer: Medicare Other | Admitting: Cardiovascular Disease

## 2019-01-31 VITALS — BP 140/90 | HR 85 | Ht 73.0 in | Wt 225.0 lb

## 2019-01-31 DIAGNOSIS — I484 Atypical atrial flutter: Secondary | ICD-10-CM | POA: Diagnosis not present

## 2019-01-31 DIAGNOSIS — I5032 Chronic diastolic (congestive) heart failure: Secondary | ICD-10-CM

## 2019-01-31 DIAGNOSIS — I25119 Atherosclerotic heart disease of native coronary artery with unspecified angina pectoris: Secondary | ICD-10-CM | POA: Diagnosis not present

## 2019-01-31 DIAGNOSIS — R0602 Shortness of breath: Secondary | ICD-10-CM | POA: Diagnosis not present

## 2019-01-31 MED FILL — POTASSIUM CHLORIDE CRYS ER: 20 MEQ | 30 days supply | Qty: 60 | Fill #4

## 2019-01-31 MED FILL — METOPROLOL TARTRATE 50 MG T: 50 | 20 days supply | Qty: 60 | Fill #1

## 2019-01-31 NOTE — Progress Notes (Signed)
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  The patient called in with progressive shortness of breath and was added on for evaluation today.  He describes progressive shortness of breath over a prolonged time period.  3 to 4 years ago he was able to go for a 3 mile walk without major limitation.  He has developed progressive dyspnea and now is only able to walk for about 100 yards.  He does not have edema, orthopnea, or PND.  He has had episodic angina where he describes a pressure-like sensation in the upper chest and neck with exertion.  This has not occurred consistently.  He is able to ride a stationary bike at a slow pace for about 30 minutes and he does this 3 to 4 days/week.  He has had no cough, fever, or chills.  He continues to have episodes of orthostatic hypotension but has not had frank syncope.  He has a lot of limitation from arthritis and especially with shoulder pain.  Past Medical History:  Diagnosis Date  . Aortic stenosis    moderate aortic stenosis  . Arthritis   . Benign prostatic hypertrophy   . 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  . Coronary artery disease    status post multiple prior percutaneous coronary  interventions, microvascular angina per Dr Olevia Perches  . Diverticulitis sigmoid colon recurrent   . Heart murmur   . Hypercoagulable state ([place])    chronically anticoagulated with coumadin  . Hyperlipidemia   . Hyperthyroidism   . Hypothyroidism    Dr. Elyse Hsu  . MGUS (monoclonal gammopathy of unknown significance) 02/17/2013  . Paroxysmal atrial fibrillation ([place])    DR. Lia Foyer,   . Prediabetes 09/21/2017  . Stroke [facility])    1990    Past Surgical History:  Procedure Laterality Date  . AORTIC VALVE REPLACEMENT  03/15/2011   Procedure: AORTIC VALVE REPLACEMENT (AVR);  Surgeon: Gaye Pollack, MD;  Location: [place];  Service: Open Heart Surgery;  Laterality: N/A;  . APPENDECTOMY    . CARDIAC CATHETERIZATION     11  . CARDIOVERSION    . CARDIOVERSION  04/15/2011   Procedure: CARDIOVERSION;  Surgeon: Loralie Champagne, MD;  Location: [place];  Service: Cardiovascular;  Laterality: N/A;  . CARDIOVERSION N/A 09/11/2014   Procedure: CARDIOVERSION;  Surgeon: Sueanne Margarita, MD;  Location: [facility] ENDOSCOPY;  Service: Cardiovascular;  Laterality: N/A;  . CARDIOVERSION N/A 06/27/2015   Procedure: CARDIOVERSION;  Surgeon: Thayer Headings, MD;  Location: [facility] ENDOSCOPY;  Service: Cardiovascular;  Laterality: N/A;  . CARDIOVERSION N/A 07/04/2015   Procedure: CARDIOVERSION;  Surgeon: Evans Lance, MD;  Location: [place];  Service: Cardiovascular;  Laterality: N/A;  . CARDIOVERSION N/A 04/13/2017   Procedure: CARDIOVERSION;  Surgeon: Sueanne Margarita, MD;  Location: [facility] ENDOSCOPY;  Service: Cardiovascular;  Laterality: N/A;  . COLONOSCOPY    . EP IMPLANTABLE DEVICE N/A 06/16/2015   Procedure: Pacemaker Implant;  Surgeon: Evans Lance, MD;  Location: [place] CV LAB;  Service: Cardiovascular;  Laterality: N/A;  . hemrrhoidectomy    . LEFT AND RIGHT HEART CATHETERIZATION WITH CORONARY ANGIOGRAM Bilateral 02/01/2011   Procedure: LEFT AND RIGHT HEART CATHETERIZATION WITH CORONARY ANGIOGRAM;  Surgeon:  Hillary Bow, MD;  Location: [facility] CATH LAB;  Service: Cardiovascular;  Laterality: Bilateral;  . MAZE  03/15/2011   Procedure: MAZE;  Surgeon: Gaye Pollack, MD;  Location: [place];  Service: Open Heart Surgery;  Laterality: N/A;  . PACEMAKER INSERTION  1991   Guidant PPM, most recent Generator Change by Dr Olevia Perches was 08/22/06  . RIGHT/LEFT HEART CATH AND CORONARY ANGIOGRAPHY N/A 07/06/2016   Procedure: Right/Left Heart Cath and Coronary Angiography;  Surgeon: Sherren Mocha, MD;  Location: [place] CV LAB;  Service: Cardiovascular;  Laterality: N/A;  . TEE WITHOUT CARDIOVERSION  04/15/2011   Procedure: TRANSESOPHAGEAL ECHOCARDIOGRAM (TEE);  Surgeon: Loralie Champagne, MD;  Location: [place];  Service: Cardiovascular;  Laterality: N/A;  . TEE WITHOUT CARDIOVERSION N/A 09/11/2014   Procedure: TRANSESOPHAGEAL ECHOCARDIOGRAM (TEE);  Surgeon: Sueanne Margarita, MD;  Location: [facility] ENDOSCOPY;  Service: Cardiovascular;  Laterality: N/A;    Current Medications: Current Meds  Medication Sig  . beclomethasone (QVAR) 80 MCG/ACT inhaler Inhale 2 puffs into the lungs 2 (two) times daily.  . bisacodyl (DULCOLAX) 5 MG EC tablet Take 5 mg by mouth as needed (constipation).   . budesonide (PULMICORT FLEXHALER) 180 MCG/ACT inhaler Inhale 2 puffs into the lungs 2 (two) times daily.  . butalbital-acetaminophen-caffeine (FIORICET, ESGIC) 50-325-40 MG per tablet Take 1 tablet by mouth 2 (two) times daily as needed (osteo-arthritis pain).   . Cholecalciferol (VITAMIN D3) 2000 units capsule Take 1,000 Units by mouth daily.   . Coenzyme Q10 100 MG capsule Take 1 tablet once a day  . Diclofenac Sodium 1.5 % SOLN Take 1 application by mouth 2 (two) times daily as needed (Knee pain).   Marland Kitchen doxazosin (CARDURA) 4 MG tablet Take 4 mg by mouth at bedtime.   Marland Kitchen ELIQUIS 5 MG TABS tablet TAKE 1 TABLET BY MOUTH 2 TIMES DAILY.  Marland Kitchen ezetimibe (ZETIA) 10 MG tablet Take 1 tablet (10 mg total) by mouth daily.  Marland Kitchen HYDROcodone-acetaminophen  (NORCO) 10-325 MG tablet TAKE 1 TABLET BY MOUTH TWICE DAILY AS NEEDED FOR MODERATE PAIN  . LORazepam (ATIVAN) 1 MG tablet Take 1 mg by mouth at bedtime.   . magnesium oxide (MAG-OX) 400 MG tablet Take 400 mg by mouth daily.  . metoprolol succinate (TOPROL-XL) 100 MG 24 hr tablet Take 1/2 tablet by mouth twice a day  . metoprolol tartrate (LOPRESSOR) 50 MG tablet Take one tablet by mouth as needed for chest pain/palpitations.  May take up to 3 tabs daily.  . nitroGLYCERIN (NITROSTAT) 0.4 MG SL tablet Place 1 tablet (0.4 mg total) under the tongue every 5 (five) minutes as needed for chest pain.  Marland Kitchen OVER THE COUNTER MEDICATION Apply 1 patch topically daily as needed (neck and shoulder pain). Chinese pain patch  . potassium chloride SA (KLOR-CON) 20 MEQ tablet Take 20 mEq by mouth daily.  . predniSONE (DELTASONE) 5 MG tablet Take 5 mg by mouth daily with breakfast.  . rosuvastatin (CRESTOR) 20 MG tablet TAKE 1 TABLET BY MOUTH AT BEDTIME.     Allergies:   Contrast  media [iodinated diagnostic agents], Gadolinium derivatives, and Metrizamide   Social History   Socioeconomic History  . Marital status: Married    Spouse name: Not on file  . Number of children: Not on file  . Years of education: Not on file  . Highest education level: Not on file  Occupational History  . Occupation: Retired    Comment: Physician  Social Needs  . Financial resource strain: Not on file  . Food insecurity    Worry: Not on file    Inability: Not on file  . Transportation needs    Medical: Not on file    Non-medical: Not on file  Tobacco Use  . Smoking status: Never Smoker  . Smokeless tobacco: Never Used  Substance and Sexual Activity  . Alcohol use: No    Alcohol/week: 0.0 standard drinks  . Drug use: No  . Sexual activity: Not on file  Lifestyle  . Physical activity    Days per week: Not on file    Minutes per session: Not on file  . Stress: Not on file  Relationships  . Social Product manager on phone: Not on file    Gets together: Not on file    Attends religious service: Not on file    Active member of club or organization: Not on file    Attends meetings of clubs or organizations: Not on file    Relationship status: Not on file  Other Topics Concern  . Not on file  Social History Narrative   Married to [place]. Has grown children   Retired Horticulturist, commercial MD      Never smoker no alcohol        Family History: The patient's family history includes Heart disease in his brother. There is no history of Anesthesia problems, Hypotension, Malignant hyperthermia, or Pseudochol deficiency.  ROS:   Please see the history of present illness.    All other systems reviewed and are negative.  EKGs/Labs/Other Studies Reviewed:    The following studies were reviewed today: 2D echocardiogram 02/02/2018: Study Conclusions  - Left ventricle: The cavity size was normal. There was mild   concentric hypertrophy. Systolic function was normal. The   estimated ejection fraction was in the range of 50% to 55%. Wall   motion was normal; there were no regional wall motion   abnormalities. - Aortic valve: A bioprosthesis was present. Valve mobility was   mildly restricted on parasternal long axis. There was very mild   stenosis consistent with prior valve gradients. There was no   regurgitation. - Aorta: Ascending aortic diameter: 40 mm (S). - Ascending aorta: The ascending aorta was mildly dilated. - Mitral valve: Calcified annulus. There was mild regurgitation. - Left atrium: The atrium was moderately dilated. - Right ventricle: The cavity size was mildly dilated. Wall   thickness was normal. Systolic function was normal. - Right atrium: The atrium was moderately dilated. - Atrial septum: No defect or patent foramen ovale was identified. - Tricuspid valve: There was mild regurgitation. Peak RV-RA   gradient (S): 30 mm Hg. - Pulmonic valve: There was mild regurgitation.   Impressions:  - Normal LV EF, bioprosthetic AVR without regurgitation, gradient   similar to prior.  Myoview 10-16-2013: Overall Impression:  Normal stress nuclear study.  No evidence of ischemia.  Normal LV function.   LV Ejection Fraction: 57%.  LV Wall Motion:  NL LV Function; NL Wall Motion.  I think that the actual LV function is  better than the computer generated 57%. EKG:  EKG is ordered today.  The ekg ordered today demonstrates atrial flutter with variable block, right bundle branch block, left anterior fascicular block.  Recent Labs: 05/09/2018: Pro B Natriuretic peptide (BNP) 141.0 08/10/2018: ALT 19; BUN 11; Creatinine 0.96; Hemoglobin 11.9; Platelet Count 208; Potassium 4.3; Sodium 142  Recent Lipid Panel    Component Value Date/Time   CHOL 174 08/10/2018 1520   CHOL 158 09/02/2017 1006   TRIG 148 08/10/2018 1520   HDL 60 08/10/2018 1520   HDL 72 09/02/2017 1006   CHOLHDL 2.9 08/10/2018 1520   VLDL 30 08/10/2018 1520   LDLCALC 84 08/10/2018 1520   LDLCALC 68 09/02/2017 1006   LDLDIRECT 117.9 11/16/2010 1355    Physical Exam:    VS:  BP 140/90   Pulse 85   Ht 6\' 1"  (1.854 m)   Wt 225 lb (102.1 kg)   SpO2 99%   BMI 29.69 kg/m     Wt Readings from Last 3 Encounters:  01/31/19 225 lb (102.1 kg)  10/31/18 225 lb (102.1 kg)  09/21/18 221 lb (100.2 kg)     GEN:  Well nourished, well developed in no acute distress HEENT: Normal NECK: No JVD; No carotid bruits LYMPHATICS: No lymphadenopathy CARDIAC: irregular, 1/6 systolic murmur at the RUSB RESPIRATORY:  Clear to auscultation without rales, wheezing or rhonchi  ABDOMEN: Soft, non-tender, non-distended MUSCULOSKELETAL:  No edema; No deformity  SKIN: Warm and dry NEUROLOGIC:  Alert and oriented x 3 PSYCHIATRIC:  Normal affect   ASSESSMENT:    1. Coronary artery disease involving native coronary artery of native heart with angina pectoris ([place])   2. Atypical atrial flutter (HCC)   3. Chronic diastolic  heart failure (HCC)    PLAN:    In order of problems listed above:  1. I personally reviewed the patient's most recent cardiac catheterization study from 2018 and we looked over his images together.  He has continued patency of the stented segment in the left circumflex.  There is nonobstructive disease in the mid right coronary artery and proximal LAD.  There were no high-grade lesions noted.  I have recommended a Lexiscan Myoview stress test for further evaluation.  I reviewed his last nuclear scan from 2015 which demonstrated no significant ischemia.  He will continue on current medical therapy. 2. The patient's EKG today demonstrates atrial flutter with variable AV block.  He has been followed closely by Dr. Lovena Le and is noted to have a left atrial flutter with recommendations for ongoing medical therapy. 3. The patient has New York Heart Association functional class II-III limitation.  His most recent echocardiogram 1 year ago demonstrated normal function of his aortic valve bioprosthesis with an LVEF of 50 to 55%.  I have recommended a BNP and a repeat echocardiogram today.  He has no evidence of volume overload on exam.  I will continue his current medical program.   Medication Adjustments/Labs and Tests Ordered: Current medicines are reviewed at length with the patient today.  Concerns regarding medicines are outlined above.  Orders Placed This Encounter  Procedures  . Basic Metabolic Panel (BMET)  . Pro b natriuretic peptide  . Magnesium  . MYOVIEW  . EKG 12-Lead  . ECHOCARDIOGRAM COMPLETE   No orders of the defined types were placed in this encounter.   Patient Instructions  Medication Instructions:  Your provider recommends that you continue on your current medications as directed. Please refer to the Current Medication list  given to you today.    Labwork: TODAY: BMET, Mag, BNP  Testing/Procedures: Your provider has requested that you have an echocardiogram.  Echocardiography is a painless test that uses sound waves to create images of your heart. It provides your doctor with information about the size and shape of your heart and how well your heart's chambers and valves are working. This procedure takes approximately one hour. There are no restrictions for this procedure.    Dr. Burt Knack recommends you have a NUCLEAR STRESS TEST.  Follow-Up: Your provider wants you to follow-up in: 6 months with Dr. Burt Knack. You will receive a reminder letter in the mail two months in advance. If you don't receive a letter, please call our office to schedule the follow-up appointment.      Signed, Sherren Mocha, MD  01/31/2019 12:15 PM    [place]

## 2019-01-31 NOTE — Patient Instructions (Signed)
Medication Instructions:  Your provider recommends that you continue on your current medications as directed. Please refer to the Current Medication list given to you today.    Labwork: TODAY: BMET, Mag, BNP  Testing/Procedures: Your provider has requested that you have an echocardiogram. Echocardiography is a painless test that uses sound waves to create images of your heart. It provides your doctor with information about the size and shape of your heart and how well your heart's chambers and valves are working. This procedure takes approximately one hour. There are no restrictions for this procedure.    Dr. Burt Knack recommends you have a NUCLEAR STRESS TEST.  Follow-Up: Your provider wants you to follow-up in: 6 months with Dr. Burt Knack. You will receive a reminder letter in the mail two months in advance. If you don't receive a letter, please call our office to schedule the follow-up appointment.

## 2019-02-01 LAB — BASIC METABOLIC PANEL
BUN/Creatinine Ratio: 14 (ref 10–24)
BUN: 13 mg/dL (ref 8–27)
CO2: 26 mmol/L (ref 20–29)
Calcium: 9.2 mg/dL (ref 8.6–10.2)
Chloride: 102 mmol/L (ref 96–106)
Creatinine, Ser: 0.9 mg/dL (ref 0.76–1.27)
GFR calc Af Amer: 92 mL/min/{1.73_m2} (ref >59)
GFR calc non Af Amer: 79 mL/min/{1.73_m2} (ref >59)
Glucose: 107 mg/dL — ABNORMAL HIGH (ref 65–99)
Potassium: 4.5 mmol/L (ref 3.5–5.2)
Sodium: 140 mmol/L (ref 134–144)

## 2019-02-01 LAB — PRO B NATRIURETIC PEPTIDE: NT-Pro BNP: 842 pg/mL — ABNORMAL HIGH (ref 0–486)

## 2019-02-01 LAB — NOVEL CORONAVIRUS, NAA: SARS-CoV-2, NAA: NOT DETECTED

## 2019-02-01 LAB — MAGNESIUM: Magnesium: 2.1 mg/dL (ref 1.6–2.3)

## 2019-02-02 ENCOUNTER — Ambulatory Visit (INDEPENDENT_AMBULATORY_CARE_PROVIDER_SITE_OTHER): Payer: Medicare Other | Admitting: *Deleted

## 2019-02-02 ENCOUNTER — Telehealth: Payer: Self-pay

## 2019-02-02 DIAGNOSIS — I495 Sick sinus syndrome: Secondary | ICD-10-CM

## 2019-02-02 NOTE — Telephone Encounter (Signed)
I spoke with the pt and let him know that he do not need to come into the office for his appointment. That appointment was with his bedside monitor. I also let him know we did receive his transmission from the monitor already. The pt verbalized understanding and thanked me for the call.

## 2019-02-04 LAB — CUP PACEART REMOTE DEVICE CHECK
Battery Remaining Longevity: 102 mo
Battery Remaining Percentage: 96 %
Brady Statistic RA Percent Paced: 0 %
Brady Statistic RV Percent Paced: 27 %
Date Time Interrogation Session: 20201204021100
Implantable Lead Implant Date: 20170417
Implantable Lead Implant Date: 20170417
Implantable Lead Location: 753859
Implantable Lead Location: 753860
Implantable Lead Model: 7740
Implantable Lead Model: 7741
Implantable Lead Serial Number: 662696
Implantable Lead Serial Number: 751382
Implantable Pulse Generator Implant Date: 20170417
Lead Channel Impedance Value: 709 Ohm
Lead Channel Impedance Value: 722 Ohm
Lead Channel Setting Pacing Amplitude: 2.5 V
Lead Channel Setting Pacing Amplitude: 2.5 V
Lead Channel Setting Pacing Pulse Width: 0.4 ms
Lead Channel Setting Sensing Sensitivity: 2.5 mV
Pulse Gen Serial Number: 718418

## 2019-02-08 ENCOUNTER — Telehealth (HOSPITAL_COMMUNITY): Payer: Self-pay

## 2019-02-08 NOTE — Telephone Encounter (Signed)
Spoke with the patient, instructions given. He stated that he would be here for his test. Asked to call back with any questions. S.Shinika Estelle EMTP 

## 2019-02-13 ENCOUNTER — Other Ambulatory Visit: Payer: Self-pay

## 2019-02-13 ENCOUNTER — Ambulatory Visit (HOSPITAL_BASED_OUTPATIENT_CLINIC_OR_DEPARTMENT_OTHER): Payer: Medicare Other

## 2019-02-13 ENCOUNTER — Ambulatory Visit (HOSPITAL_COMMUNITY): Payer: Medicare Other | Attending: Cardiovascular Disease

## 2019-02-13 VITALS — Ht 73.0 in | Wt 225.0 lb

## 2019-02-13 DIAGNOSIS — I25119 Atherosclerotic heart disease of native coronary artery with unspecified angina pectoris: Secondary | ICD-10-CM | POA: Insufficient documentation

## 2019-02-13 DIAGNOSIS — R110 Nausea: Secondary | ICD-10-CM | POA: Diagnosis present

## 2019-02-13 LAB — MYOCARDIAL PERFUSION IMAGING
LV dias vol: 109 mL (ref 62–150)
LV sys vol: 56 mL
Peak HR: 88 {beats}/min
Rest HR: 76 {beats}/min
SDS: 0
SRS: 0
SSS: 0
TID: 0.98

## 2019-02-13 MED ORDER — AMINOPHYLLINE 25 MG/ML IV SOLN
75.0000 mg | Freq: Once | INTRAVENOUS | Status: AC
  Administered 2019-02-13: 75 mg via INTRAVENOUS

## 2019-02-13 MED ORDER — REGADENOSON 0.4 MG/5ML IV SOLN
0.4000 mg | Freq: Once | INTRAVENOUS | Status: AC
  Administered 2019-02-13: 0.4 mg via INTRAVENOUS

## 2019-02-13 MED ORDER — TECHNETIUM TC 99M TETROFOSMIN IV KIT
32.2000 | PACK | Freq: Once | INTRAVENOUS | Status: AC | PRN
  Administered 2019-02-13: 32.2 via INTRAVENOUS
  Filled 2019-02-13: qty 33

## 2019-02-13 MED ORDER — TECHNETIUM TC 99M TETROFOSMIN IV KIT
11.0000 | PACK | Freq: Once | INTRAVENOUS | Status: AC | PRN
  Administered 2019-02-13: 11 via INTRAVENOUS
  Filled 2019-02-13: qty 11

## 2019-02-13 NOTE — Progress Notes (Signed)
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  Location: Patient: Home  Provider: Clinic  This service was conducted via virtual visit. The patient was located at home. I was located in my office.  Consent was obtained prior to the virtual visit and is aware of possible charges through their insurance for this visit.  The patient is an established patient.  Dr. Estanislado Pandy, MD conducted the virtual visit and Hazel Sams, PA-C acted as scribe during the service.  Office staff helped with scheduling follow up visits after the service was conducted.     I discussed the limitations, risks, security and privacy concerns of performing an evaluation and management service by telephone and the availability of in person appointments. I also discussed with the patient that there may be a patient responsible charge related to this service. The patient expressed understanding and agreed to proceed.  CC: Pain in multiple joints  History of Present Illness: Patient is a 84 year old male with history of osteoarthritis, DDD, and myofascial pain. He has ongoing pain in multiple joints. He has chronic pain in both shoulder joints.  He has tried using arnica topically and voltaren gel topically as needed for pain relief.  He states the topical agents provide about 35% pain relief. He continues to take hydrocodone 1/2 tablet to 1 tablet twice daily as needed for pain relief.  He states without hydrocodone his pain is a 9/10 and after he takes hydrocodone it is a 3/10. He has chronic pain in both hand but no inflammation.  He has chronic pain in his neck and lower back pain.    Review of Systems  Constitutional: Positive for malaise/fatigue. Negative for fever.  Eyes: Negative for photophobia, pain, discharge and redness.  Respiratory: Negative for cough, shortness of breath  and wheezing.   Cardiovascular: Negative for chest pain and palpitations.  Gastrointestinal: Negative for blood in stool, constipation and diarrhea.  Genitourinary: Negative for dysuria.  Musculoskeletal: Positive for joint pain. Negative for back pain, myalgias and neck pain.  Skin: Negative for rash.  Neurological: Negative for dizziness and headaches.  Psychiatric/Behavioral: Negative for depression. The patient is not nervous/anxious and does not have insomnia.       Observations/Objective: Physical Exam  Constitutional: He is oriented to person, place, and time.  Neurological: He is alert and oriented to person, place, and time.  Psychiatric: Mood, memory, affect and judgment normal.   Patient reports joint stiffness all day  Patient reports nocturnal pain.  Difficulty dressing/grooming: Reports Difficulty climbing stairs: Reports Difficulty getting out of chair: Reports Difficulty using hands for taps, buttons, cutlery, and/or writing: Reports   Assessment and Plan: Primary osteoarthritis of both hands-He has chronic pain and stiffness in both hands.  No joint swelling currently.  Joint protection and muscle strengthening were discussed. He will follow up in 6 months.   Primary osteoarthritis of both shoulders-He has chronic pain and stiffness in both shoulder joints.  He applies arnica and voltaren gel topically as needed for pain relief, which provided about 35% pain relief. He takes hydrocodone 1/2 tablet to 1 tablet by mouth twice daily as needed for pain relief. His pain is a 9/10 prior to taking hydrocodone and comes down to a 3/10 after taking hydrocodone. He cannot have a shoulder joint replacement due to his underlying medical conditions.  DDD (degenerative disc disease), cervical-He has chronic neck pain and stiffness.  No symptoms of radiculopathy at this time.   DDD (degenerative disc disease), lumbar-He has chronic lower back pain.  He takes hydrocodone as needed  for pain relief.    Trochanteric bursitis of right hip-He has intermittent discomfort, which is exacerbated by walking prolonged distances.   Other fatigue-Chronic and stable.   Other chronic pain -He is on Hydrocodone for pain management..He has been on the same dose of hydrocodone for many years.  He takes 1/2 tablet to 1 tablet BID PRN for pain relief. He is unable to take any NSAIDs due to being on anticoagulants.  UDS updated on 11/16/17.  He is due to update UDS.   Other medical conditions are listed as follows:   History of pacemaker  S/P AVR  History of vitamin D deficiency-he is on vitamin D supplement.  Paroxysmal atrial fibrillation (HCC) - He has a pacemaker  MGUS (monoclonal gammopathy of unknown significance)-followed up by hematology.  History of stroke - /[place]   Primary hypercoagulable state [facility])  Coronary artery disease with exertional angina ([place])   Follow Up Instructions: He will follow up in 6 months    I discussed the assessment and treatment plan with the patient. The patient was provided an opportunity to ask questions and all were answered. The patient agreed with the plan and demonstrated an understanding of the instructions.   The patient was advised to call back or seek an in-person evaluation if the symptoms worsen or if the condition fails to improve as anticipated.  I provided 15 minutes of non-face-to-face time during this encounter.   Bo Merino, MD   Scribed by-  Hazel Sams, PA-C

## 2019-02-14 ENCOUNTER — Telehealth (INDEPENDENT_AMBULATORY_CARE_PROVIDER_SITE_OTHER): Payer: Medicare Other | Admitting: Rheumatology

## 2019-02-14 ENCOUNTER — Encounter: Payer: Self-pay | Admitting: Rheumatology

## 2019-02-14 ENCOUNTER — Telehealth: Payer: Self-pay | Admitting: Cardiovascular Disease

## 2019-02-14 DIAGNOSIS — M7918 Myalgia, other site: Secondary | ICD-10-CM

## 2019-02-14 DIAGNOSIS — I480 Paroxysmal atrial fibrillation: Secondary | ICD-10-CM

## 2019-02-14 DIAGNOSIS — M19011 Primary osteoarthritis, right shoulder: Secondary | ICD-10-CM

## 2019-02-14 DIAGNOSIS — Z950 Presence of cardiac pacemaker: Secondary | ICD-10-CM

## 2019-02-14 DIAGNOSIS — M19042 Primary osteoarthritis, left hand: Secondary | ICD-10-CM

## 2019-02-14 DIAGNOSIS — I25118 Atherosclerotic heart disease of native coronary artery with other forms of angina pectoris: Secondary | ICD-10-CM

## 2019-02-14 DIAGNOSIS — Z8673 Personal history of transient ischemic attack (TIA), and cerebral infarction without residual deficits: Secondary | ICD-10-CM

## 2019-02-14 DIAGNOSIS — D472 Monoclonal gammopathy: Secondary | ICD-10-CM

## 2019-02-14 DIAGNOSIS — Z8639 Personal history of other endocrine, nutritional and metabolic disease: Secondary | ICD-10-CM

## 2019-02-14 DIAGNOSIS — M7061 Trochanteric bursitis, right hip: Secondary | ICD-10-CM

## 2019-02-14 DIAGNOSIS — M19041 Primary osteoarthritis, right hand: Secondary | ICD-10-CM

## 2019-02-14 DIAGNOSIS — M19012 Primary osteoarthritis, left shoulder: Secondary | ICD-10-CM

## 2019-02-14 DIAGNOSIS — R5383 Other fatigue: Secondary | ICD-10-CM

## 2019-02-14 DIAGNOSIS — I25119 Atherosclerotic heart disease of native coronary artery with unspecified angina pectoris: Secondary | ICD-10-CM

## 2019-02-14 DIAGNOSIS — M5136 Other intervertebral disc degeneration, lumbar region: Secondary | ICD-10-CM

## 2019-02-14 DIAGNOSIS — Z952 Presence of prosthetic heart valve: Secondary | ICD-10-CM

## 2019-02-14 DIAGNOSIS — G8929 Other chronic pain: Secondary | ICD-10-CM

## 2019-02-14 DIAGNOSIS — D6859 Other primary thrombophilia: Secondary | ICD-10-CM

## 2019-02-14 DIAGNOSIS — M5030 Other cervical disc degeneration, unspecified cervical region: Secondary | ICD-10-CM

## 2019-02-14 NOTE — Telephone Encounter (Signed)
The patient called to review recent echo and stress test results. Reviewed preliminary results with patient, but he understands he will be called when Dr. Burt Knack reviews. He was grateful for assistance.

## 2019-02-14 NOTE — Telephone Encounter (Signed)
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 States that it would be great to get a call back within the next hour. Please give Dr. Velora Heckler a call back.

## 2019-02-15 MED ORDER — METOPROLOL SUCCINATE ER 100 MG PO TB24: 100.0000 mg | ORAL_TABLET | Freq: Two times a day (BID) | ORAL | 3 refills | Status: DC

## 2019-02-15 NOTE — Addendum Note (Signed)
Addended by: Harland German A on: 02/15/2019 05:49 PM   Modules accepted: Orders

## 2019-02-15 NOTE — Telephone Encounter (Signed)
Reviewed results with patient again.  Dr. Burt Knack reviewed with patient as well.  Dr. Burt Knack instructed the patient to INCREASE TOPROL to 100 mg BID. Called in to pharmacy.

## 2019-02-21 ENCOUNTER — Telehealth: Payer: Self-pay

## 2019-02-21 ENCOUNTER — Telehealth: Payer: Self-pay | Admitting: Rheumatology

## 2019-02-21 NOTE — Telephone Encounter (Signed)
UDS results received via fax.   02/15/2019 Results are consistent with treatment.   UDS reviewed by Hazel Sams, PA-C. Will send document to scan center.

## 2019-02-21 NOTE — Telephone Encounter (Signed)
-----   Message from Shona Needles, RT sent at 02/14/2019  2:55 PM EST ----- Regarding: 6 MONTH F/U

## 2019-02-21 NOTE — Telephone Encounter (Signed)
LMOM for patient to call and schedule 6 month follow-up appointment ?

## 2019-02-26 ENCOUNTER — Ambulatory Visit: Payer: Medicare Other | Attending: Internal Medicine

## 2019-02-26 ENCOUNTER — Other Ambulatory Visit: Payer: Self-pay

## 2019-02-26 DIAGNOSIS — Z20822 Contact with and (suspected) exposure to covid-19: Secondary | ICD-10-CM

## 2019-02-26 MED ORDER — HYDROCODONE-ACETAMINOPHEN 10-325 MG PO TABS: ORAL_TABLET | ORAL | 0 refills | Status: DC

## 2019-02-26 MED FILL — HYDROCODON-APAP 10-325: 10-325 | 30 days supply | Qty: 60 | Fill #0

## 2019-02-26 NOTE — Telephone Encounter (Signed)
Patient called requesting a refill of hydrocodone to be sent to [facility].   Last Visit: 02/14/2019 Next Visit: message sent to the front desk to schedule.  UDS:02/15/2019  Narc Agreement: 11/16/2017  Last fill: 01/19/2019  Okay to refill hydrocodone?

## 2019-02-28 LAB — NOVEL CORONAVIRUS, NAA: SARS-CoV-2, NAA: NOT DETECTED

## 2019-03-09 MED FILL — LORAZEPAM 1 MG TABS: 1 | 60 days supply | Qty: 60 | Fill #2

## 2019-03-09 MED FILL — BUTALBITAL-APAP-CAFFEINE 50: 50-325-40 | 90 days supply | Qty: 180 | Fill #0

## 2019-03-13 ENCOUNTER — Ambulatory Visit: Payer: Medicare Other | Attending: Internal Medicine

## 2019-03-13 DIAGNOSIS — Z23 Encounter for immunization: Secondary | ICD-10-CM | POA: Diagnosis not present

## 2019-03-13 NOTE — Progress Notes (Signed)
   Covid-19 Vaccination Clinic  Name:  Kevin ATKISON, MD    MRN: [id number] DOB: 1934/05/14  03/13/2019  [name] was observed post Covid-19 immunization for 30 minutes based on pre-vaccination screening without incidence. He was provided with Vaccine Information Sheet and instruction to access the V-Safe system.   [name] was instructed to call 911 with any severe reactions post vaccine: Marland Kitchen Difficulty breathing  . Swelling of your face and throat  . A fast heartbeat  . A bad rash all over your body  . Dizziness and weakness    Immunizations Administered    Name Date Dose VIS Date Route   Pfizer COVID-19 Vaccine 03/13/2019 12:06 PM 0.3 mL 02/09/2019 Intramuscular   Manufacturer: [place]   Lot: [id number]   [place]: [id number]

## 2019-03-15 ENCOUNTER — Other Ambulatory Visit: Payer: Self-pay | Admitting: Internal Medicine

## 2019-03-15 NOTE — Telephone Encounter (Signed)
Age 85, weight 102kg, SCr 0.9 on 01/31/19, last OV 01/2019, afib indication

## 2019-03-21 ENCOUNTER — Other Ambulatory Visit: Payer: Self-pay | Admitting: Rheumatology

## 2019-03-21 MED FILL — POTASSIUM CHLORIDE CRYS ER: 20 | 30 days supply | Qty: 60 | Fill #5

## 2019-03-21 NOTE — Telephone Encounter (Signed)
Last Visit: 02/14/2019 Next Visit: message sent to the front desk to schedule.  UDS:02/15/2019  Narc Agreement: 11/16/2017  Last fill: 02/26/19  Okay to refill hydrocodone?

## 2019-03-22 ENCOUNTER — Telehealth: Payer: Self-pay | Admitting: *Deleted

## 2019-03-22 NOTE — Telephone Encounter (Signed)
Left message requesting call from Dr. Benay Spice. He has vaccine questions.

## 2019-03-27 MED FILL — predniSONE 10 MG TABS: 10 | 90 days supply | Qty: 100 | Fill #0

## 2019-03-27 MED FILL — HYDROCODON-APAP 10-325: 10-325 | 30 days supply | Qty: 60 | Fill #0

## 2019-04-02 ENCOUNTER — Ambulatory Visit: Payer: Medicare Other

## 2019-04-02 ENCOUNTER — Ambulatory Visit: Payer: Medicare Other | Attending: Internal Medicine

## 2019-04-02 DIAGNOSIS — Z23 Encounter for immunization: Secondary | ICD-10-CM

## 2019-04-02 NOTE — Progress Notes (Signed)
   Covid-19 Vaccination Clinic  Name:  KOLBI SHETTY, MD    MRN: [id number] DOB: [birth year]  04/02/2019  Mr. Beato was observed post Covid-19 immunization for 30 minutes based on pre-vaccination screening without incidence. He was provided with Vaccine Information Sheet and instruction to access the V-Safe system.   Mr. Runnells was instructed to call 911 with any severe reactions post vaccine: Marland Kitchen Difficulty breathing  . Swelling of your face and throat  . A fast heartbeat  . A bad rash all over your body  . Dizziness and weakness    Immunizations Administered    Name Date Dose VIS Date Route   Pfizer COVID-19 Vaccine 04/02/2019  9:54 AM 0.3 mL 02/09/2019 Intramuscular   Manufacturer: [place]   Lot: [id number]   [place]: [id number]

## 2019-04-26 ENCOUNTER — Other Ambulatory Visit: Payer: Self-pay | Admitting: Internal Medicine

## 2019-04-26 ENCOUNTER — Other Ambulatory Visit: Payer: Self-pay | Admitting: Rheumatology

## 2019-04-26 MED FILL — HYDROCODON-APAP 10-325: 10-325 | 30 days supply | Qty: 60 | Fill #0

## 2019-04-26 MED FILL — DOXAZOSIN MESYLATE 4 MG TAB: 4 | 90 days supply | Qty: 90 | Fill #0

## 2019-04-26 MED FILL — ELIQUIS 5 MG TABLET: 5 | 90 days supply | Qty: 180 | Fill #0

## 2019-04-26 NOTE — Telephone Encounter (Signed)
Prescription refill request for Eliquis received.  Last office visit: [name] 01/31/2019 Scr: 0.90, 01/31/2019 Age: 85 y.o. Weight: 102.1 kg   Prescription refill sent.

## 2019-04-26 NOTE — Telephone Encounter (Signed)
Last Visit:02/14/2019 Next Visit:message sent to the front desk to schedule. UDS:02/15/2019 Narc Agreement:11/16/2017  Last fill: 03/21/19  Okay to refill Hydrocodone?

## 2019-05-01 MED FILL — LORazepam 1 MG TABS: 1 | 60 days supply | Qty: 60 | Fill #3

## 2019-05-04 ENCOUNTER — Ambulatory Visit (INDEPENDENT_AMBULATORY_CARE_PROVIDER_SITE_OTHER): Payer: Medicare Other | Admitting: *Deleted

## 2019-05-04 DIAGNOSIS — I495 Sick sinus syndrome: Secondary | ICD-10-CM

## 2019-05-04 LAB — CUP PACEART REMOTE DEVICE CHECK
Battery Remaining Longevity: 96 mo
Battery Remaining Percentage: 94 %
Brady Statistic RA Percent Paced: 0 %
Brady Statistic RV Percent Paced: 33 %
Date Time Interrogation Session: 20210305021100
Implantable Lead Implant Date: 20170417
Implantable Lead Implant Date: 20170417
Implantable Lead Location: 753859
Implantable Lead Location: 753860
Implantable Lead Model: 7740
Implantable Lead Model: 7741
Implantable Lead Serial Number: 662696
Implantable Lead Serial Number: 751382
Implantable Pulse Generator Implant Date: 20170417
Lead Channel Impedance Value: 698 Ohm
Lead Channel Impedance Value: 708 Ohm
Lead Channel Setting Pacing Amplitude: 2.5 V
Lead Channel Setting Pacing Amplitude: 2.5 V
Lead Channel Setting Pacing Pulse Width: 0.4 ms
Lead Channel Setting Sensing Sensitivity: 2.5 mV
Pulse Gen Serial Number: 718418

## 2019-05-05 NOTE — Progress Notes (Signed)
PPM remote 

## 2019-05-09 ENCOUNTER — Other Ambulatory Visit: Payer: Self-pay | Admitting: Physician Assistant

## 2019-05-09 ENCOUNTER — Other Ambulatory Visit: Payer: Self-pay | Admitting: Cardiovascular Disease

## 2019-05-09 ENCOUNTER — Other Ambulatory Visit: Payer: Self-pay | Admitting: Internal Medicine

## 2019-05-09 DIAGNOSIS — I209 Angina pectoris, unspecified: Secondary | ICD-10-CM

## 2019-05-09 MED FILL — POTASSIUM CHLORIDE CRYS ER: 20 | 30 days supply | Qty: 60 | Fill #0

## 2019-05-09 MED FILL — ROSUVASTATIN CALCIUM 20 MG: 20 | 90 days supply | Qty: 90 | Fill #0 | Status: TO

## 2019-05-09 MED FILL — NITROGLYCERIN 0.4 MG TAB SL: 0.4 | 75 days supply | Qty: 75 | Fill #0

## 2019-05-09 MED FILL — EZETIMIBE 10 MG TABS: 10 | 90 days supply | Qty: 90 | Fill #2

## 2019-05-28 ENCOUNTER — Other Ambulatory Visit: Payer: Self-pay | Admitting: Rheumatology

## 2019-05-28 MED FILL — HYDROCODON-APAP 10-325: 10-325 | 30 days supply | Qty: 60 | Fill #0

## 2019-05-28 NOTE — Telephone Encounter (Signed)
Last Visit:02/14/2019 Next Visit:message sent to the front desk to schedule. UDS:02/15/2019 Narc Agreement:11/16/2017  Last fill: 04/26/19   Okay to refill Hydrocodone?

## 2019-06-04 MED FILL — BUTALB-ACETAMIN-CAFF 50-325: 50-325-40 | 90 days supply | Qty: 180 | Fill #1

## 2019-06-20 ENCOUNTER — Ambulatory Visit (INDEPENDENT_AMBULATORY_CARE_PROVIDER_SITE_OTHER): Payer: Medicare Other | Admitting: Cardiovascular Disease

## 2019-06-20 ENCOUNTER — Encounter: Payer: Self-pay | Admitting: Cardiovascular Disease

## 2019-06-20 ENCOUNTER — Other Ambulatory Visit: Payer: Self-pay

## 2019-06-20 VITALS — BP 120/74 | Ht 74.0 in | Wt 225.0 lb

## 2019-06-20 DIAGNOSIS — I480 Paroxysmal atrial fibrillation: Secondary | ICD-10-CM

## 2019-06-20 DIAGNOSIS — I209 Angina pectoris, unspecified: Secondary | ICD-10-CM | POA: Diagnosis not present

## 2019-06-20 DIAGNOSIS — I484 Atypical atrial flutter: Secondary | ICD-10-CM

## 2019-06-20 DIAGNOSIS — D472 Monoclonal gammopathy: Secondary | ICD-10-CM

## 2019-06-20 DIAGNOSIS — I25119 Atherosclerotic heart disease of native coronary artery with unspecified angina pectoris: Secondary | ICD-10-CM

## 2019-06-20 DIAGNOSIS — I5032 Chronic diastolic (congestive) heart failure: Secondary | ICD-10-CM

## 2019-06-20 MED ORDER — RANOLAZINE ER 500 MG PO TB12: 500.0000 mg | ORAL_TABLET | Freq: Two times a day (BID) | ORAL | 3 refills | Status: DC

## 2019-06-20 MED FILL — RANOLAZINE ER 500 MG TABLET: 500 | 90 days supply | Qty: 180 | Fill #0

## 2019-06-20 NOTE — Progress Notes (Signed)
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  The patient is here alone today.  He called in because of progressive fatigue and shortness of breath.  He does not have orthopnea, PND, or leg edema.  He occasionally has chest discomfort with activity in the upper chest, neck, and shoulder area.  This is been associated with both arthritis as well as microvascular angina in the past.  His breathing has progressively gotten worse over the last year and he is short of breath now with low-level activities.  He cannot do many of the things he could do just a few years ago.  He does not have resting dyspnea.  He has been tried on short courses of diuretics but has not appreciated any clinical improvement with this.  Evaluation in December 2020 included a Myoview scan and echocardiogram which were essentially unrevealing other than the presence of diastolic dysfunction.  The patient's nuclear study showed no significant ischemia.  A BNP was moderately elevated.  Past Medical History:  Diagnosis Date  . Aortic stenosis    moderate aortic stenosis  . Arthritis   . Benign prostatic hypertrophy   . 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  . Coronary artery  disease    status post multiple prior percutaneous coronary interventions, microvascular angina per Dr Olevia Perches  . Diverticulitis sigmoid colon recurrent   . Heart murmur   . Hypercoagulable state ([place])    chronically anticoagulated with coumadin  . Hyperlipidemia   . Hyperthyroidism   . Hypothyroidism    Dr. Elyse Hsu  . MGUS (monoclonal gammopathy of unknown significance) 02/17/2013  . Paroxysmal atrial fibrillation ([place])    DR. Lia Foyer,   . Prediabetes 09/21/2017  . Stroke [facility])    1990    Past Surgical History:  Procedure Laterality Date  . AORTIC VALVE REPLACEMENT  03/15/2011   Procedure: AORTIC VALVE REPLACEMENT (AVR);  Surgeon: Gaye Pollack, MD;  Location: [place];  Service: Open Heart Surgery;  Laterality: N/A;  . APPENDECTOMY    . CARDIAC CATHETERIZATION     11  . CARDIOVERSION    . CARDIOVERSION  04/15/2011   Procedure: CARDIOVERSION;  Surgeon: Loralie Champagne, MD;  Location: [place];  Service: Cardiovascular;  Laterality: N/A;  . CARDIOVERSION N/A 09/11/2014   Procedure: CARDIOVERSION;  Surgeon: Sueanne Margarita, MD;  Location: [facility] ENDOSCOPY;  Service: Cardiovascular;  Laterality: N/A;  . CARDIOVERSION N/A 06/27/2015   Procedure: CARDIOVERSION;  Surgeon: Thayer Headings, MD;  Location: [facility] ENDOSCOPY;  Service: Cardiovascular;  Laterality: N/A;  . CARDIOVERSION N/A 07/04/2015   Procedure: CARDIOVERSION;  Surgeon: Evans Lance, MD;  Location: [place];  Service: Cardiovascular;  Laterality: N/A;  . CARDIOVERSION N/A 04/13/2017   Procedure: CARDIOVERSION;  Surgeon: Sueanne Margarita, MD;  Location: [facility]  ENDOSCOPY;  Service: Cardiovascular;  Laterality: N/A;  . COLONOSCOPY    . EP IMPLANTABLE DEVICE N/A 06/16/2015   Procedure: Pacemaker Implant;  Surgeon: Evans Lance, MD;  Location: [place] CV LAB;  Service: Cardiovascular;  Laterality: N/A;  . hemrrhoidectomy    . LEFT AND RIGHT HEART CATHETERIZATION WITH CORONARY ANGIOGRAM Bilateral 02/01/2011   Procedure: LEFT AND RIGHT  HEART CATHETERIZATION WITH CORONARY ANGIOGRAM;  Surgeon: Hillary Bow, MD;  Location: [facility] CATH LAB;  Service: Cardiovascular;  Laterality: Bilateral;  . MAZE  03/15/2011   Procedure: MAZE;  Surgeon: Gaye Pollack, MD;  Location: [place];  Service: Open Heart Surgery;  Laterality: N/A;  . PACEMAKER INSERTION  1991   Guidant PPM, most recent Generator Change by Dr Olevia Perches was 08/22/06  . RIGHT/LEFT HEART CATH AND CORONARY ANGIOGRAPHY N/A 07/06/2016   Procedure: Right/Left Heart Cath and Coronary Angiography;  Surgeon: Sherren Mocha, MD;  Location: [place] CV LAB;  Service: Cardiovascular;  Laterality: N/A;  . TEE WITHOUT CARDIOVERSION  04/15/2011   Procedure: TRANSESOPHAGEAL ECHOCARDIOGRAM (TEE);  Surgeon: Loralie Champagne, MD;  Location: [place];  Service: Cardiovascular;  Laterality: N/A;  . TEE WITHOUT CARDIOVERSION N/A 09/11/2014   Procedure: TRANSESOPHAGEAL ECHOCARDIOGRAM (TEE);  Surgeon: Sueanne Margarita, MD;  Location: [facility] ENDOSCOPY;  Service: Cardiovascular;  Laterality: N/A;    Current Medications: No outpatient medications have been marked as taking for the 06/20/19 encounter (Office Visit) with Sherren Mocha, MD.     Allergies:   Contrast media [iodinated diagnostic agents], Gadolinium derivatives, and Metrizamide   Social History   Socioeconomic History  . Marital status: Married    Spouse name: Not on file  . Number of children: Not on file  . Years of education: Not on file  . Highest education level: Not on file  Occupational History  . Occupation: Retired    Comment: Physician  Tobacco Use  . Smoking status: Never Smoker  . Smokeless tobacco: Never Used  Substance and Sexual Activity  . Alcohol use: No    Alcohol/week: 0.0 standard drinks  . Drug use: No  . Sexual activity: Not on file  Other Topics Concern  . Not on file  Social History Narrative   Married to [place]. Has grown children   Retired Horticulturist, commercial MD      Never smoker no alcohol      Social  Determinants of Radio broadcast assistant Strain:   . Difficulty of Paying Living Expenses:   Food Insecurity:   . Worried About Charity fundraiser in the Last Year:   . Arboriculturist in the Last Year:   Transportation Needs:   . Film/video editor (Medical):   Marland Kitchen Lack of Transportation (Non-Medical):   Physical Activity:   . Days of Exercise per Week:   . Minutes of Exercise per Session:   Stress:   . Feeling of Stress :   Social Connections:   . Frequency of Communication with Friends and Family:   . Frequency of Social Gatherings with Friends and Family:   . Attends Religious Services:   . Active Member of Clubs or Organizations:   . Attends Archivist Meetings:   Marland Kitchen Marital Status:      Family History: The patient's family history includes Heart disease in his brother. There is no history of Anesthesia problems, Hypotension, Malignant hyperthermia, or Pseudochol deficiency.  ROS:   Please see the history of present illness.    All other  systems reviewed and are negative.  EKGs/Labs/Other Studies Reviewed:    The following studies were reviewed today: Echo 02-13-2019: IMPRESSIONS    1. Left ventricular ejection fraction, by visual estimation, is 55 to  60%. The left ventricle has normal function. There is no left ventricular  hypertrophy.  2. Abnormal septal motion consistent with post-operative status.  3. The left ventricle has no regional wall motion abnormalities.  4. Global right ventricle has normal systolic function.The right  ventricular size is normal. No increase in right ventricular wall  thickness.  5. Left atrial size was moderately dilated.  6. Right atrial size was moderately dilated.  7. Presence of pericardial fat pad.  8. Trivial pericardial effusion is present.  9. Mild to moderate mitral annular calcification.  10. The mitral valve is degenerative. Mild mitral valve regurgitation.  11. The tricuspid valve is grossly  normal. Tricuspid valve regurgitation  is mild.  12. Aortic valve regurgitation is not visualized.  13. Bioprosthetic valve in place. Vmax 1.9 m/2, mean gradient 8 mmHG, EOA  1.43 cm2, DI 0.32. Normal functioning prosthetic valve.  14. The pulmonic valve was grossly normal. Pulmonic valve regurgitation is  not visualized.  15. Normal pulmonary artery systolic pressure.  16. The tricuspid regurgitant velocity is 1.90 m/s, and with an assumed  right atrial pressure of 8 mmHg, the estimated right ventricular systolic  pressure is normal at 22.4 mmHg.  17. A pacer wire is visualized in the RA and RV.  18. The inferior vena cava is dilated in size with >50% respiratory  variability, suggesting right atrial pressure of 8 mmHg.  19. No significant change from prior study.   Myocardial Perfusion Scan 02-13-2019: Study Highlights   Nuclear stress EF: 48%. Visually, the EF appears to be normal. Echo from today shows normal EF of 55-60%.  There was no ST segment deviation noted during stress.  This is a low risk study. There is no evidence of ischemia or previous infarction.  The study is normal.    EKG:  EKG is ordered today.  The ekg ordered today demonstrates atypical atrial flutter 90 bpm  Recent Labs: 01/31/2019: Magnesium 2.1; NT-Pro BNP 842 06/20/2019: ALT 15; BUN 12; Creatinine, Ser 1.00; Hemoglobin 12.1; Platelets 248; Potassium 4.6; Sodium 142  Recent Lipid Panel    Component Value Date/Time   CHOL 174 08/10/2018 1520   CHOL 158 09/02/2017 1006   TRIG 148 08/10/2018 1520   HDL 60 08/10/2018 1520   HDL 72 09/02/2017 1006   CHOLHDL 2.9 08/10/2018 1520   VLDL 30 08/10/2018 1520   LDLCALC 84 08/10/2018 1520   LDLCALC 68 09/02/2017 1006   LDLDIRECT 117.9 11/16/2010 1355    Physical Exam:    VS:  BP 120/74   Ht 6\' 2"  (1.88 m)   Wt 225 lb (102.1 kg) Comment: patient reported  BMI 28.89 kg/m     Wt Readings from Last 3 Encounters:  06/20/19 225 lb (102.1 kg)  02/13/19  225 lb (102.1 kg)  01/31/19 225 lb (102.1 kg)     GEN:  Well nourished, well developed in no acute distress HEENT: Normal NECK: No JVD; No carotid bruits LYMPHATICS: No lymphadenopathy CARDIAC: Irregularly irregular, 2/6 systolic ejection murmur at the right upper sternal border RESPIRATORY:  Clear to auscultation without rales, wheezing or rhonchi  ABDOMEN: Soft, non-tender, non-distended MUSCULOSKELETAL:  No edema; No deformity  SKIN: Warm and dry NEUROLOGIC:  Alert and oriented x 3 PSYCHIATRIC:  Normal affect   ASSESSMENT:  1. Atypical atrial flutter ([place])   2. Chronic diastolic (congestive) heart failure ([place])   3. Coronary artery disease involving native coronary artery of native heart with angina pectoris ([place])    PLAN:    In order of problems listed above:  1. Continue with heart rate control using metoprolol.  The patient is chronically anticoagulated with apixaban.  States that his heart rate is generally well controlled on 50 mg of metoprolol succinate twice daily.  He will occasionally take 100 mg if his heart rate is higher than 100 bpm.  We have explored the idea of ablation, but he is felt to be a high risk candidate because of the left-sided location and his comorbid conditions. 2. He has not improved with diuretics in the past.  We will continue current medical therapy.  His most recent echocardiogram is reviewed as above.  We will try ranolazine 500 mg twice daily. 3. He does have a history of microvascular angina.  He may have small vessel disease as part of his functional limitation.  We will give him a trial of ranolazine as above.   Medication Adjustments/Labs and Tests Ordered: Current medicines are reviewed at length with the patient today.  Concerns regarding medicines are outlined above.  Orders Placed This Encounter  Procedures  . IgG, IgA, IgM  . Comprehensive metabolic panel  . CBC with Differential/Platelet  . Kappa/lambda light chains  .  Immunofixation electrophoresis  . EKG 12-Lead   Meds ordered this encounter  Medications  . ranolazine (RANEXA) 500 MG 12 hr tablet    Sig: Take 1 tablet (500 mg total) by mouth 2 (two) times daily.    Dispense:  180 tablet    Refill:  3    Patient Instructions  Medication Instructions:  1) START RANEXA 500 mg twice daily *If you need a refill on your cardiac medications before your next appointment, please call your pharmacy*  Follow-Up: At [facility], you and your health needs are our priority.  As part of our continuing mission to provide you with exceptional heart care, we have created designated Provider Care Teams.  These Care Teams include your primary Cardiologist (physician) and Advanced Practice Providers (APPs -  Physician Assistants and Nurse Practitioners) who all work together to provide you with the care you need, when you need it. Your next appointment:   6 month(s) The format for your next appointment:   In Person Provider:   You may see Sherren Mocha, MD or one of the following Advanced Practice Providers on your designated Care Team:    Richardson Dopp, PA-C  Vin [place], PA-C  Daune Perch, [place]      Signed, Sherren Mocha, MD  06/22/2019 7:45 PM    [place]

## 2019-06-20 NOTE — Patient Instructions (Signed)
Medication Instructions:  1) START RANEXA 500 mg twice daily *If you need a refill on your cardiac medications before your next appointment, please call your pharmacy*  Follow-Up: At [facility], you and your health needs are our priority.  As part of our continuing mission to provide you with exceptional heart care, we have created designated Provider Care Teams.  These Care Teams include your primary Cardiologist (physician) and Advanced Practice Providers (APPs -  Physician Assistants and Nurse Practitioners) who all work together to provide you with the care you need, when you need it. Your next appointment:   6 month(s) The format for your next appointment:   In Person Provider:   You may see Sherren Mocha, MD or one of the following Advanced Practice Providers on your designated Care Team:    Richardson Dopp, PA-C  Vin [place], [place]  Daune Perch, [place]

## 2019-06-22 ENCOUNTER — Encounter: Payer: Self-pay | Admitting: Cardiovascular Disease

## 2019-06-22 ENCOUNTER — Other Ambulatory Visit: Payer: Self-pay | Admitting: Physician Assistant

## 2019-06-22 NOTE — Telephone Encounter (Signed)
Attempted to contact patient and left message on machine to advise patient he is due to update UDS and narcotic agreement as soon as possible.

## 2019-06-22 NOTE — Telephone Encounter (Signed)
Last Visit: 02/14/2019 telemedicine  Next Visit: message sent to the front desk to schedule.  UDS:02/15/2019  Narc Agreement: 11/16/2017  Last fill: 05/28/2019   Okay to refill hydrocodone?

## 2019-06-22 NOTE — Telephone Encounter (Signed)
Okay to give him 30-day supply.  Please let him know that he needs to come as soon as possible for his UDS and narcotic agreement renewal.

## 2019-06-25 LAB — CBC WITH DIFFERENTIAL/PLATELET
Basophils Absolute: 0 10*3/uL (ref 0.0–0.2)
Basos: 0 %
EOS (ABSOLUTE): 0.2 10*3/uL (ref 0.0–0.4)
Eos: 2 %
Hematocrit: 37.1 % — ABNORMAL LOW (ref 37.5–51.0)
Hemoglobin: 12.1 g/dL — ABNORMAL LOW (ref 13.0–17.7)
Immature Grans (Abs): 0 10*3/uL (ref 0.0–0.1)
Immature Granulocytes: 0 %
Lymphocytes Absolute: 0.6 10*3/uL — ABNORMAL LOW (ref 0.7–3.1)
Lymphs: 8 %
MCH: 28.9 pg (ref 26.6–33.0)
MCHC: 32.6 g/dL (ref 31.5–35.7)
MCV: 89 fL (ref 79–97)
Monocytes Absolute: 0.4 10*3/uL (ref 0.1–0.9)
Monocytes: 5 %
Neutrophils Absolute: 6.9 10*3/uL (ref 1.4–7.0)
Neutrophils: 85 %
Platelets: 248 10*3/uL (ref 150–450)
RBC: 4.19 x10E6/uL (ref 4.14–5.80)
RDW: 12.7 % (ref 11.6–15.4)
WBC: 8.2 10*3/uL (ref 3.4–10.8)

## 2019-06-25 LAB — COMPREHENSIVE METABOLIC PANEL
ALT: 15 IU/L (ref 0–44)
AST: 23 IU/L (ref 0–40)
Albumin/Globulin Ratio: 1.5 (ref 1.2–2.2)
Albumin: 4.4 g/dL (ref 3.6–4.6)
Alkaline Phosphatase: 106 IU/L (ref 39–117)
BUN/Creatinine Ratio: 12 (ref 10–24)
BUN: 12 mg/dL (ref 8–27)
Bilirubin Total: 0.4 mg/dL (ref 0.0–1.2)
CO2: 23 mmol/L (ref 20–29)
Calcium: 9.6 mg/dL (ref 8.6–10.2)
Chloride: 103 mmol/L (ref 96–106)
Creatinine, Ser: 1 mg/dL (ref 0.76–1.27)
GFR calc Af Amer: 81 mL/min/{1.73_m2} (ref >59)
GFR calc non Af Amer: 70 mL/min/{1.73_m2} (ref >59)
Globulin, Total: 2.9 g/dL (ref 1.5–4.5)
Glucose: 115 mg/dL — ABNORMAL HIGH (ref 65–99)
Potassium: 4.6 mmol/L (ref 3.5–5.2)
Sodium: 142 mmol/L (ref 134–144)

## 2019-06-25 LAB — KAPPA/LAMBDA LIGHT CHAINS
Ig Kappa Free Light Chain: 25.4 mg/L — ABNORMAL HIGH (ref 3.3–19.4)
Ig Lambda Free Light Chain: 40.2 mg/L — ABNORMAL HIGH (ref 5.7–26.3)
KAPPA/LAMBDA RATIO: 0.63 (ref 0.26–1.65)

## 2019-06-25 LAB — IMMUNOFIXATION ELECTROPHORESIS
IgA/Immunoglobulin A, Serum: 141 mg/dL (ref 61–437)
IgG (Immunoglobin G), Serum: 871 mg/dL (ref 603–1613)
IgM (Immunoglobulin M), Srm: 1000 mg/dL — ABNORMAL HIGH (ref 15–143)
Total Protein: 7.3 g/dL (ref 6.0–8.5)

## 2019-06-25 MED FILL — HYDROCODON-APAP 10-325: 10-325 | 30 days supply | Qty: 60 | Fill #0

## 2019-06-27 MED FILL — LORazepam 1 MG TABS: 1 | 60 days supply | Qty: 60 | Fill #0

## 2019-06-28 MED FILL — AMOXICILLIN 500 MG CAPSULE: 500 | 3 days supply | Qty: 12 | Fill #0

## 2019-07-06 DIAGNOSIS — R946 Abnormal results of thyroid function studies: Secondary | ICD-10-CM | POA: Diagnosis not present

## 2019-07-19 ENCOUNTER — Other Ambulatory Visit: Payer: Self-pay | Admitting: Rheumatology

## 2019-07-19 DIAGNOSIS — G8929 Other chronic pain: Secondary | ICD-10-CM

## 2019-07-19 DIAGNOSIS — Z5181 Encounter for therapeutic drug level monitoring: Secondary | ICD-10-CM

## 2019-07-19 MED FILL — ELIQUIS 5 MG TABLET: 5 | 90 days supply | Qty: 180 | Fill #1

## 2019-07-19 MED FILL — ROSUVASTATIN CALCIUM 20 MG: 20 | 90 days supply | Qty: 90 | Fill #1 | Status: TO

## 2019-07-19 MED FILL — EZETIMIBE 10 MG TABS: 10 | 90 days supply | Qty: 90 | Fill #3

## 2019-07-19 MED FILL — DOXAZOSIN MESYLATE 4 MG TAB: 4 | 90 days supply | Qty: 90 | Fill #1

## 2019-07-19 MED FILL — METOPROLOL SUCCINATE ER 100: 100 | 90 days supply | Qty: 180 | Fill #0

## 2019-07-19 NOTE — Telephone Encounter (Signed)
Last Visit: 02/14/2019 telemedicine  Next Visit: patient will schedule.  UDS:02/15/2019  Narc Agreement: 11/16/2017  Last Fill: 06/22/2019   Spoke with patient and advised he is due to update UDS and narcotic agreement. order for UDS order faxed to [facility] (attn: Mliss Sax) and narcotic agreement mailed to his home address per patient's request.   Okay to refill hydrocodone?

## 2019-07-21 DIAGNOSIS — Z79899 Other long term (current) drug therapy: Secondary | ICD-10-CM | POA: Diagnosis not present

## 2019-07-21 DIAGNOSIS — Z5181 Encounter for therapeutic drug level monitoring: Secondary | ICD-10-CM | POA: Diagnosis not present

## 2019-07-21 DIAGNOSIS — G8929 Other chronic pain: Secondary | ICD-10-CM | POA: Diagnosis not present

## 2019-07-24 ENCOUNTER — Telehealth: Payer: Self-pay | Admitting: *Deleted

## 2019-07-24 MED FILL — HYDROCODON-APAP 10-325: 10-325 | 30 days supply | Qty: 60 | Fill #0

## 2019-07-24 NOTE — Telephone Encounter (Signed)
Received UDS collected on 07/21/2019.  Reviewed by Hazel Sams, PA-C   UDS Panel 5: negative  Patient's fill history 03/21/2019, 04/26/2019, 05/28/2019, 06/22/2019, 07/19/2019  Spoke with patient and verified patient is taking Hydrocodone 1 tablet every 12 hours. Patient states he occasionally takes less.

## 2019-08-03 ENCOUNTER — Telehealth: Payer: Self-pay

## 2019-08-03 NOTE — Telephone Encounter (Signed)
Left message for patient to remind of missed remote transmission.  

## 2019-08-10 ENCOUNTER — Other Ambulatory Visit: Payer: Self-pay

## 2019-08-10 ENCOUNTER — Ambulatory Visit (INDEPENDENT_AMBULATORY_CARE_PROVIDER_SITE_OTHER): Payer: Medicare Other | Admitting: *Deleted

## 2019-08-10 ENCOUNTER — Inpatient Hospital Stay (HOSPITAL_BASED_OUTPATIENT_CLINIC_OR_DEPARTMENT_OTHER): Payer: Medicare Other | Admitting: Oncology

## 2019-08-10 ENCOUNTER — Inpatient Hospital Stay: Payer: Medicare Other | Attending: Oncology

## 2019-08-10 VITALS — BP 169/96 | HR 87 | Temp 97.2°F | Resp 20 | Ht 74.0 in

## 2019-08-10 DIAGNOSIS — M1990 Unspecified osteoarthritis, unspecified site: Secondary | ICD-10-CM | POA: Insufficient documentation

## 2019-08-10 DIAGNOSIS — I2510 Atherosclerotic heart disease of native coronary artery without angina pectoris: Secondary | ICD-10-CM | POA: Diagnosis not present

## 2019-08-10 DIAGNOSIS — Z8673 Personal history of transient ischemic attack (TIA), and cerebral infarction without residual deficits: Secondary | ICD-10-CM | POA: Diagnosis not present

## 2019-08-10 DIAGNOSIS — R768 Other specified abnormal immunological findings in serum: Secondary | ICD-10-CM | POA: Diagnosis not present

## 2019-08-10 DIAGNOSIS — I4891 Unspecified atrial fibrillation: Secondary | ICD-10-CM | POA: Diagnosis not present

## 2019-08-10 DIAGNOSIS — I209 Angina pectoris, unspecified: Secondary | ICD-10-CM | POA: Diagnosis not present

## 2019-08-10 DIAGNOSIS — D472 Monoclonal gammopathy: Secondary | ICD-10-CM

## 2019-08-10 DIAGNOSIS — N400 Enlarged prostate without lower urinary tract symptoms: Secondary | ICD-10-CM | POA: Diagnosis not present

## 2019-08-10 DIAGNOSIS — I495 Sick sinus syndrome: Secondary | ICD-10-CM

## 2019-08-10 DIAGNOSIS — R5381 Other malaise: Secondary | ICD-10-CM | POA: Insufficient documentation

## 2019-08-10 LAB — CUP PACEART REMOTE DEVICE CHECK
Battery Remaining Longevity: 96 mo
Battery Remaining Percentage: 93 %
Brady Statistic RA Percent Paced: 0 %
Brady Statistic RV Percent Paced: 31 %
Date Time Interrogation Session: 20210610173100
Implantable Lead Implant Date: 20170417
Implantable Lead Implant Date: 20170417
Implantable Lead Location: 753859
Implantable Lead Location: 753860
Implantable Lead Model: 7740
Implantable Lead Model: 7741
Implantable Lead Serial Number: 662696
Implantable Lead Serial Number: 751382
Implantable Pulse Generator Implant Date: 20170417
Lead Channel Impedance Value: 643 Ohm
Lead Channel Impedance Value: 666 Ohm
Lead Channel Setting Pacing Amplitude: 2.5 V
Lead Channel Setting Pacing Amplitude: 2.5 V
Lead Channel Setting Pacing Pulse Width: 0.4 ms
Lead Channel Setting Sensing Sensitivity: 2.5 mV
Pulse Gen Serial Number: 718418

## 2019-08-10 NOTE — Patient Instructions (Signed)
Please provide a copy of your Medical Advanced Directive to have scanned into chart when available.

## 2019-08-10 NOTE — Progress Notes (Signed)
Remote pacemaker transmission.   

## 2019-08-10 NOTE — Progress Notes (Signed)
  [facility] OFFICE PROGRESS NOTE   Diagnosis: Monoclonal protein  INTERVAL HISTORY:   [name] returns as scheduled.  He has multiple complaints including diffuse arthritis pain and malaise.  He relates the malaise to atrial fibrillation.  No fever, night sweats, or palpable lymph nodes.  Good appetite.  He plans to begin a weight loss diet.  Objective:  Vital signs in last 24 hours:  Blood pressure (!) 169/96, pulse 87, temperature (!) 97.2 F (36.2 C), temperature source Temporal, resp. rate 20, height '6\' 2"'$  (1.88 m), SpO2 98 %.     Lymphatics: No cervical, supraclavicular, axillary, or inguinal nodes Resp: Lungs clear bilaterally Cardio: Regular rhythm GI: No hepatosplenomegaly Vascular: No leg edema  Lab Results:  Lab Results  Component Value Date   WBC 8.2 06/20/2019   HGB 12.1 (L) 06/20/2019   HCT 37.1 (L) 06/20/2019   MCV 89 06/20/2019   PLT 248 06/20/2019   NEUTROABS 6.9 06/20/2019    CMP  Lab Results  Component Value Date   NA 142 06/20/2019   K 4.6 06/20/2019   CL 103 06/20/2019   CO2 23 06/20/2019   GLUCOSE 115 (H) 06/20/2019   BUN 12 06/20/2019   CREATININE 1.00 06/20/2019   CALCIUM 9.6 06/20/2019   PROT 7.3 06/20/2019   ALBUMIN 4.4 06/20/2019   AST 23 06/20/2019   ALT 15 06/20/2019   ALKPHOS 106 06/20/2019   BILITOT 0.4 06/20/2019   GFRNONAA 70 06/20/2019   GFRAA 81 06/20/2019   06/20/2019: IgM 1000, lambda free light chain 40.2, serum immunofixation-monoclonal IgM lambda Imaging:  CUP PACEART REMOTE DEVICE CHECK  Result Date: 08/10/2019 Scheduled remote reviewed. Normal device function.  Known Persistent AFlutter programmed DDIR. NST events appear conducted Aflutter. Overall rates controlled. On toprol and eliquis. Next remote 91 days. [place]   Medications: I have reviewed the patient's current medications.   Assessment/Plan: 1. 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 Coronary artery disease 3. BPH 4. Diverticulitis 5. Arrhythmia, status post pacemaker placement 6. History of atrial fibrillation 7. Aortic stenosis, status post aortic valve replacement 8. History of a CVA    Disposition: [name] appears stable.  He has an elevated IgM level with a serum monoclonal IgM lambda protein.  There is no clinical evidence for progression to multiple myeloma or another lymphoproliferative disorder.  It is possible the M protein is related to an underlying collagen vascular disease.  He will be scheduled for a lab visit in approximately 5 months.  He will return for an office visit in 1 year.  [name] has received the COVID-19 vaccine.  Betsy Coder, MD  08/10/2019  12:04 PM

## 2019-08-13 ENCOUNTER — Telehealth: Payer: Self-pay | Admitting: Oncology

## 2019-08-13 NOTE — Telephone Encounter (Signed)
Scheduled per los. Called and left msg. Mailed printout  °

## 2019-08-14 ENCOUNTER — Encounter: Payer: Self-pay | Admitting: Internal Medicine

## 2019-08-14 ENCOUNTER — Other Ambulatory Visit: Payer: Self-pay

## 2019-08-14 ENCOUNTER — Ambulatory Visit (INDEPENDENT_AMBULATORY_CARE_PROVIDER_SITE_OTHER): Payer: Medicare Other | Admitting: Internal Medicine

## 2019-08-14 VITALS — BP 148/88 | HR 82 | Ht 73.0 in | Wt 231.0 lb

## 2019-08-14 DIAGNOSIS — I495 Sick sinus syndrome: Secondary | ICD-10-CM | POA: Diagnosis not present

## 2019-08-14 DIAGNOSIS — I951 Orthostatic hypotension: Secondary | ICD-10-CM | POA: Diagnosis not present

## 2019-08-14 DIAGNOSIS — I4819 Other persistent atrial fibrillation: Secondary | ICD-10-CM | POA: Diagnosis not present

## 2019-08-14 DIAGNOSIS — I209 Angina pectoris, unspecified: Secondary | ICD-10-CM

## 2019-08-14 DIAGNOSIS — I498 Other specified cardiac arrhythmias: Secondary | ICD-10-CM

## 2019-08-14 DIAGNOSIS — Z950 Presence of cardiac pacemaker: Secondary | ICD-10-CM

## 2019-08-14 NOTE — Progress Notes (Signed)
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 He is a pleasant almost 85 yo man with CAD, AS, s/p AVR, persistent atrial fib/flutter, sinus node dysfunction s/p PPM. 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 We tried him on dofetilide and he maintained NSR but could not tell that he felt any better, and then reverted back to atrial flutter and did not feel any worse. He has dyspnea with exertion but appears to be limited mostly by his sore knees. He is considering knee replacement surgery.  Allergies  Allergen Reactions   Contrast Media [Iodinated Diagnostic Agents] Hives   Gadolinium Derivatives Hives   Metrizamide Hives     Current Outpatient Medications  Medication Sig Dispense Refill   bisacodyl (DULCOLAX) 5 MG EC tablet Take 5 mg by mouth as needed (constipation).      butalbital-acetaminophen-caffeine (FIORICET, ESGIC) 50-325-40 MG per tablet Take 1 tablet by mouth 2 (two) times daily as needed (osteo-arthritis pain).      Cholecalciferol (VITAMIN D3) 2000 units capsule Take 1,000 Units by mouth daily.      Coenzyme Q10 100 MG capsule Take 1 tablet once a day     Diclofenac Sodium 1.5 % SOLN Take 1 application by mouth 2 (two) times daily as needed (Knee pain).      doxazosin (CARDURA) 4 MG tablet Take 4 mg by mouth at bedtime.      ELIQUIS 5 MG TABS tablet TAKE 1 TABLET BY MOUTH 2 TIMES DAILY. 180 tablet 1   ezetimibe (ZETIA) 10 MG tablet Take 1 tablet (10 mg total) by mouth daily. 90 tablet 3   HYDROcodone-acetaminophen (NORCO) 10-325 MG tablet TAKE 1 TABLET BY MOUTH TWO TIMES DAILY AS NEEDED FOR MODERATE PAIN 60 tablet 0   LORazepam (ATIVAN) 1 MG tablet Take 1 mg by mouth at bedtime.      magnesium oxide (MAG-OX) 400 MG tablet Take 400 mg by mouth daily.     metoprolol succinate (TOPROL-XL) 50 MG 24 hr tablet Take 50 mg by mouth in the morning and at bedtime. Take with or immediately following a meal.     metoprolol tartrate (LOPRESSOR) 50  MG tablet Take one tablet by mouth as needed for chest pain/palpitations.  May take up to 3 tabs daily. 60 tablet 2   nitroGLYCERIN (NITROSTAT) 0.4 MG SL tablet PLACE 1 TABLET UNDER THE TONGUE EVERY 5 MINUTES AS NEEDED FOR CHEST PAIN. 75 tablet 2   OVER THE COUNTER MEDICATION Apply 1 patch topically daily as needed (neck and shoulder pain). Chinese pain patch     potassium chloride SA (KLOR-CON) 20 MEQ tablet TAKE 2 TABLETS BY MOUTH DAILY. 60 tablet 8   predniSONE (DELTASONE) 5 MG tablet Take 5 mg by mouth daily with breakfast.     rosuvastatin (CRESTOR) 20 MG tablet TAKE 1 TABLET BY MOUTH AT BEDTIME. 90 tablet 2   No current facility-administered medications for this visit.     Past Medical History:  Diagnosis Date   Aortic stenosis    moderate aortic stenosis   Arthritis    Benign prostatic hypertrophy    Chronotropic incompetence with sinus node dysfunction (HCC)    Status post Guidant dual-mode, dual-pacing, dual-sensing  pacemaker   implantation now programmed to AAI with recent generator change.   Coronary artery disease    status post multiple prior percutaneous coronary interventions, microvascular angina per Dr Olevia Perches   Diverticulitis sigmoid colon recurrent    Heart murmur  Hypercoagulable state ([place])    chronically anticoagulated with coumadin   Hyperlipidemia    Hyperthyroidism    Hypothyroidism    Dr. Elyse Hsu   MGUS (monoclonal gammopathy of unknown significance) 02/17/2013   Paroxysmal atrial fibrillation ([place])    DR. Lia Foyer,    Prediabetes 09/21/2017   Stroke ([place])    1990    ROS:   All systems reviewed and negative except as noted in the HPI.   Past Surgical History:  Procedure Laterality Date   AORTIC VALVE REPLACEMENT  03/15/2011   Procedure: AORTIC VALVE REPLACEMENT (AVR);  Surgeon: Gaye Pollack, MD;  Location: [place];  Service: Open Heart Surgery;  Laterality: N/A;   APPENDECTOMY     CARDIAC CATHETERIZATION     13    CARDIOVERSION     CARDIOVERSION  04/15/2011   Procedure: CARDIOVERSION;  Surgeon: Loralie Champagne, MD;  Location: [place];  Service: Cardiovascular;  Laterality: N/A;   CARDIOVERSION N/A 09/11/2014   Procedure: CARDIOVERSION;  Surgeon: Sueanne Margarita, MD;  Location: [place] [facility];  Service: Cardiovascular;  Laterality: N/A;   CARDIOVERSION N/A 06/27/2015   Procedure: CARDIOVERSION;  Surgeon: Thayer Headings, MD;  Location: [place];  Service: Cardiovascular;  Laterality: N/A;   CARDIOVERSION N/A 07/04/2015   Procedure: CARDIOVERSION;  Surgeon: Evans Lance, MD;  Location: [place];  Service: Cardiovascular;  Laterality: N/A;   CARDIOVERSION N/A 04/13/2017   Procedure: CARDIOVERSION;  Surgeon: Sueanne Margarita, MD;  Location[facility] ENDOSCOPY;  Service: Cardiovascular;  Laterality: N/A;   COLONOSCOPY     EP IMPLANTABLE DEVICE N/A 06/16/2015   Procedure: Pacemaker Implant;  Surgeon: Evans Lance, MD;  Location: [place] CV LAB;  Service: Cardiovascular;  Laterality: N/A;   hemrrhoidectomy     LEFT AND RIGHT HEART CATHETERIZATION WITH CORONARY ANGIOGRAM Bilateral 02/01/2011   Procedure: LEFT AND RIGHT HEART CATHETERIZATION WITH CORONARY ANGIOGRAM;  Surgeon: Hillary Bow, MD;  Location: [facility] CATH LAB;  Service: Cardiovascular;  Laterality: Bilateral;   MAZE  03/15/2011   Procedure: MAZE;  Surgeon: Gaye Pollack, MD;  Location: [place];  Service: Open Heart Surgery;  Laterality: N/A;   PACEMAKER INSERTION  1991   Guidant PPM, most recent Generator Change by Dr Olevia Perches was 08/22/06   RIGHT/LEFT HEART CATH AND CORONARY ANGIOGRAPHY N/A 07/06/2016   Procedure: Right/Left Heart Cath and Coronary Angiography;  Surgeon: Sherren Mocha, MD;  Location: [place] CV LAB;  Service: Cardiovascular;  Laterality: N/A;   TEE WITHOUT CARDIOVERSION  04/15/2011   Procedure: TRANSESOPHAGEAL ECHOCARDIOGRAM (TEE);  Surgeon: Loralie Champagne, MD;  Location: [place];  Service: Cardiovascular;   Laterality: N/A;   TEE WITHOUT CARDIOVERSION N/A 09/11/2014   Procedure: TRANSESOPHAGEAL ECHOCARDIOGRAM (TEE);  Surgeon: Sueanne Margarita, MD;  Location: [facility] ENDOSCOPY;  Service: Cardiovascular;  Laterality: N/A;     Family History  Problem Relation Age of Onset   Heart disease Brother        Twin brother has coronary disease and recent AVR for AS   Anesthesia problems Neg Hx    Hypotension Neg Hx    Malignant hyperthermia Neg Hx    Pseudochol deficiency Neg Hx      Social History   Socioeconomic History   Marital status: Married    Spouse name: Not on file   Number of children: Not on file   Years of education: Not on file   Highest education level: Not on file  Occupational History   Occupation: Retired    Comment:  Physician  Tobacco Use   Smoking status: Never Smoker   Smokeless tobacco: Never Used  Vaping Use   Vaping Use: Never used  Substance and Sexual Activity   Alcohol use: No    Alcohol/week: 0.0 standard drinks   Drug use: No   Sexual activity: Not on file  Other Topics Concern   Not on file  Social History Narrative   Married to [place]. Has grown children   Retired Horticulturist, commercial MD      Never smoker no alcohol      Social Determinants of Radio broadcast assistant Strain:    Difficulty of Paying Living Expenses:   Food Insecurity:    Worried About Charity fundraiser in the Last Year:    Arboriculturist in the Last Year:   Transportation Needs:    Film/video editor (Medical):    Lack of Transportation (Non-Medical):   Physical Activity:    Days of Exercise per Week:    Minutes of Exercise per Session:   Stress:    Feeling of Stress :   Social Connections:    Frequency of Communication with Friends and Family:    Frequency of Social Gatherings with Friends and Family:    Attends Religious Services:    Active Member of Clubs or Organizations:    Attends Music therapist:    Marital Status:     Intimate Partner Violence:    Fear of Current or Ex-Partner:    Emotionally Abused:    Physically Abused:    Sexually Abused:      BP (!) 148/88    Pulse 82    Ht 6\' 1"  (1.854 m)    Wt 231 lb (104.8 kg)    SpO2 94%    BMI 30.48 kg/m   Physical Exam:  Well appearing NAD HEENT: Unremarkable Neck:  No JVD, no thyromegally Lymphatics:  No adenopathy Back:  No CVA tenderness Lungs:  Clear with no wheezes HEART:  Regular rate rhythm, no murmurs, no rubs, no clicks Abd:  soft, positive bowel sounds, no organomegally, no rebound, no guarding Ext:  2 plus pulses, no edema, no cyanosis, no clubbing Skin:  No rashes no nodules Neuro:  CN II through XII intact, motor grossly intact  DEVICE  Normal device function.  See PaceArt for details.   Assess/Plan: 1. Atrial flutter - he has left atrial flutter. Review of his electrograms demonstrates that his VR is controlled but appropriate. He will continue his beta blocker 2. Coags -he has had no bleeding on eliquis. Continue. 3. HTN - his bp is minimally elevated today. No change in his meds. 4. PPM - his [employer] Sci DDD PM is working normally. He has over 7 years on the battery.  Mikle Bosworth.D.

## 2019-08-14 NOTE — Patient Instructions (Addendum)
Medication Instructions:  Your physician recommends that you continue on your current medications as directed. Please refer to the Current Medication list given to you today.  Labwork: None ordered.  Testing/Procedures: None ordered.  Follow-Up: Your physician wants you to follow-up in: 6 months with Dr. Lovena Le.   You will receive a reminder letter in the mail two months in advance. If you don't receive a letter, please call our office to schedule the follow-up appointment.  Remote monitoring is used to monitor your Pacemaker from home. This monitoring reduces the number of office visits required to check your device to one time per year. It allows [place] to keep an eye on the functioning of your device to ensure it is working properly. You are scheduled for a device check from home on 11/09/2019. You may send your transmission at any time that day. If you have a wireless device, the transmission will be sent automatically. After your physician reviews your transmission, you will receive a postcard with your next transmission date.  Any Other Special Instructions Will Be Listed Below (If Applicable).  If you need a refill on your cardiac medications before your next appointment, please call your pharmacy.

## 2019-08-17 DIAGNOSIS — M170 Bilateral primary osteoarthritis of knee: Secondary | ICD-10-CM | POA: Diagnosis not present

## 2019-08-17 DIAGNOSIS — M1711 Unilateral primary osteoarthritis, right knee: Secondary | ICD-10-CM | POA: Diagnosis not present

## 2019-08-17 DIAGNOSIS — M1712 Unilateral primary osteoarthritis, left knee: Secondary | ICD-10-CM | POA: Diagnosis not present

## 2019-08-21 ENCOUNTER — Other Ambulatory Visit: Payer: Self-pay | Admitting: Rheumatology

## 2019-08-21 MED FILL — LORazepam 1 MG TABS: 1 | 60 days supply | Qty: 60 | Fill #1

## 2019-08-21 MED FILL — HYDROCODON-APAP 10-325: 10-325 | 30 days supply | Qty: 60 | Fill #0

## 2019-08-21 MED FILL — AMOX-CLAV 875-125 MG TABLET: 875-125 | 7 days supply | Qty: 14 | Fill #0

## 2019-08-21 NOTE — Telephone Encounter (Signed)
Last Visit: 02/14/2019 telemedicine  Next Visit: patient will schedule UDS: 07/21/2019 Narc Agreement: 11/16/2017 (patient has updated. Not scanned in the chart yet)  Last Fill: 07/19/2019  Okay to refill Hydrocodone?

## 2019-08-23 DIAGNOSIS — M170 Bilateral primary osteoarthritis of knee: Secondary | ICD-10-CM | POA: Diagnosis not present

## 2019-08-23 DIAGNOSIS — M1712 Unilateral primary osteoarthritis, left knee: Secondary | ICD-10-CM | POA: Diagnosis not present

## 2019-08-23 DIAGNOSIS — M1711 Unilateral primary osteoarthritis, right knee: Secondary | ICD-10-CM | POA: Diagnosis not present

## 2019-08-24 DIAGNOSIS — I8312 Varicose veins of left lower extremity with inflammation: Secondary | ICD-10-CM | POA: Diagnosis not present

## 2019-08-24 DIAGNOSIS — I872 Venous insufficiency (chronic) (peripheral): Secondary | ICD-10-CM | POA: Diagnosis not present

## 2019-08-24 DIAGNOSIS — I8311 Varicose veins of right lower extremity with inflammation: Secondary | ICD-10-CM | POA: Diagnosis not present

## 2019-08-24 DIAGNOSIS — Z85828 Personal history of other malignant neoplasm of skin: Secondary | ICD-10-CM | POA: Diagnosis not present

## 2019-08-24 MED FILL — DOXYCYCLINE HYCLATE 100 MG: 100 | 14 days supply | Qty: 28 | Fill #0

## 2019-08-24 MED FILL — CEPHALEXIN 500 MG CAPSULE: 500 | 10 days supply | Qty: 40 | Fill #0

## 2019-08-31 MED FILL — BUTALB-ACETAMIN-CAFF 50-325: 50-325-40 | 90 days supply | Qty: 180 | Fill #2

## 2019-09-05 LAB — CUP PACEART INCLINIC DEVICE CHECK
Date Time Interrogation Session: 20210615172202
Implantable Lead Implant Date: 20170417
Implantable Lead Implant Date: 20170417
Implantable Lead Location: 753859
Implantable Lead Location: 753860
Implantable Lead Model: 7740
Implantable Lead Model: 7741
Implantable Lead Serial Number: 662696
Implantable Lead Serial Number: 751382
Implantable Pulse Generator Implant Date: 20170417
Lead Channel Impedance Value: 676 Ohm
Lead Channel Impedance Value: 682 Ohm
Lead Channel Pacing Threshold Amplitude: 0.5 V
Lead Channel Pacing Threshold Amplitude: 0.8 V
Lead Channel Pacing Threshold Pulse Width: 0.4 ms
Lead Channel Pacing Threshold Pulse Width: 0.5 ms
Lead Channel Sensing Intrinsic Amplitude: 14 mV
Lead Channel Sensing Intrinsic Amplitude: 3.7 mV
Lead Channel Setting Pacing Amplitude: 2.5 V
Lead Channel Setting Pacing Amplitude: 2.5 V
Lead Channel Setting Pacing Pulse Width: 0.4 ms
Lead Channel Setting Sensing Sensitivity: 2.5 mV
Pulse Gen Serial Number: 718418

## 2019-09-13 DIAGNOSIS — I83212 Varicose veins of right lower extremity with both ulcer of calf and inflammation: Secondary | ICD-10-CM | POA: Diagnosis not present

## 2019-09-13 DIAGNOSIS — Z85828 Personal history of other malignant neoplasm of skin: Secondary | ICD-10-CM | POA: Diagnosis not present

## 2019-09-13 DIAGNOSIS — C4442 Squamous cell carcinoma of skin of scalp and neck: Secondary | ICD-10-CM | POA: Diagnosis not present

## 2019-09-21 ENCOUNTER — Other Ambulatory Visit: Payer: Self-pay | Admitting: Rheumatology

## 2019-09-21 MED FILL — HYDROCODON-APAP 10-325: 10-325 | 30 days supply | Qty: 60 | Fill #0

## 2019-09-21 MED FILL — POTASSIUM CHLORIDE CRYS ER: 20 | 30 days supply | Qty: 60 | Fill #1

## 2019-09-21 NOTE — Telephone Encounter (Signed)
Last Visit:02/14/2019 telemedicine Next Visit:patient will schedule UDS: 07/21/2019 Narc Agreement: 07/24/2019  Last Fill: 08/24/2019  Okay to refill Hydrocodone?

## 2019-09-25 DIAGNOSIS — D229 Melanocytic nevi, unspecified: Secondary | ICD-10-CM | POA: Diagnosis not present

## 2019-09-25 DIAGNOSIS — D225 Melanocytic nevi of trunk: Secondary | ICD-10-CM | POA: Diagnosis not present

## 2019-09-25 DIAGNOSIS — D1801 Hemangioma of skin and subcutaneous tissue: Secondary | ICD-10-CM | POA: Diagnosis not present

## 2019-09-25 DIAGNOSIS — L853 Xerosis cutis: Secondary | ICD-10-CM | POA: Diagnosis not present

## 2019-09-25 DIAGNOSIS — L570 Actinic keratosis: Secondary | ICD-10-CM | POA: Diagnosis not present

## 2019-09-25 DIAGNOSIS — L821 Other seborrheic keratosis: Secondary | ICD-10-CM | POA: Diagnosis not present

## 2019-09-25 DIAGNOSIS — Z85828 Personal history of other malignant neoplasm of skin: Secondary | ICD-10-CM | POA: Diagnosis not present

## 2019-09-25 MED FILL — predniSONE 10 MG TABS: 10 | 90 days supply | Qty: 100 | Fill #0

## 2019-10-01 DIAGNOSIS — C4442 Squamous cell carcinoma of skin of scalp and neck: Secondary | ICD-10-CM | POA: Diagnosis not present

## 2019-10-01 DIAGNOSIS — L0889 Other specified local infections of the skin and subcutaneous tissue: Secondary | ICD-10-CM | POA: Diagnosis not present

## 2019-10-01 DIAGNOSIS — Z85828 Personal history of other malignant neoplasm of skin: Secondary | ICD-10-CM | POA: Diagnosis not present

## 2019-10-01 MED FILL — DOXYCYCLINE HYCLATE 100 MG: 100 | 7 days supply | Qty: 14 | Fill #0

## 2019-10-08 DIAGNOSIS — C4442 Squamous cell carcinoma of skin of scalp and neck: Secondary | ICD-10-CM | POA: Diagnosis not present

## 2019-10-08 DIAGNOSIS — Z85828 Personal history of other malignant neoplasm of skin: Secondary | ICD-10-CM | POA: Diagnosis not present

## 2019-10-12 DIAGNOSIS — Z4801 Encounter for change or removal of surgical wound dressing: Secondary | ICD-10-CM | POA: Diagnosis not present

## 2019-10-12 DIAGNOSIS — L0889 Other specified local infections of the skin and subcutaneous tissue: Secondary | ICD-10-CM | POA: Diagnosis not present

## 2019-10-13 ENCOUNTER — Observation Stay (HOSPITAL_COMMUNITY): Payer: Medicare Other

## 2019-10-13 ENCOUNTER — Inpatient Hospital Stay (HOSPITAL_COMMUNITY)
Admission: EM | Admit: 2019-10-13 | Discharge: 2019-10-16 | DRG: 247 | Disposition: A | Discharge status: Home / Self Care | Payer: Medicare Other | Attending: Internal Medicine | Admitting: Internal Medicine

## 2019-10-13 ENCOUNTER — Emergency Department (HOSPITAL_COMMUNITY): Payer: Medicare Other

## 2019-10-13 ENCOUNTER — Encounter (HOSPITAL_COMMUNITY): Payer: Self-pay | Admitting: Internal Medicine

## 2019-10-13 ENCOUNTER — Other Ambulatory Visit: Payer: Self-pay

## 2019-10-13 DIAGNOSIS — I471 Supraventricular tachycardia: Secondary | ICD-10-CM

## 2019-10-13 DIAGNOSIS — R011 Cardiac murmur, unspecified: Secondary | ICD-10-CM | POA: Diagnosis present

## 2019-10-13 DIAGNOSIS — R197 Diarrhea, unspecified: Secondary | ICD-10-CM

## 2019-10-13 DIAGNOSIS — E0590 Thyrotoxicosis, unspecified without thyrotoxic crisis or storm: Secondary | ICD-10-CM | POA: Diagnosis present

## 2019-10-13 DIAGNOSIS — Z79899 Other long term (current) drug therapy: Secondary | ICD-10-CM

## 2019-10-13 DIAGNOSIS — I259 Chronic ischemic heart disease, unspecified: Secondary | ICD-10-CM

## 2019-10-13 DIAGNOSIS — R778 Other specified abnormalities of plasma proteins: Secondary | ICD-10-CM | POA: Diagnosis not present

## 2019-10-13 DIAGNOSIS — Z7952 Long term (current) use of systemic steroids: Secondary | ICD-10-CM

## 2019-10-13 DIAGNOSIS — I4891 Unspecified atrial fibrillation: Secondary | ICD-10-CM | POA: Diagnosis not present

## 2019-10-13 DIAGNOSIS — I495 Sick sinus syndrome: Secondary | ICD-10-CM | POA: Diagnosis present

## 2019-10-13 DIAGNOSIS — I214 Non-ST elevation (NSTEMI) myocardial infarction: Secondary | ICD-10-CM | POA: Diagnosis not present

## 2019-10-13 DIAGNOSIS — G894 Chronic pain syndrome: Secondary | ICD-10-CM

## 2019-10-13 DIAGNOSIS — I2510 Atherosclerotic heart disease of native coronary artery without angina pectoris: Secondary | ICD-10-CM | POA: Diagnosis present

## 2019-10-13 DIAGNOSIS — R000 Tachycardia, unspecified: Secondary | ICD-10-CM | POA: Diagnosis not present

## 2019-10-13 DIAGNOSIS — N400 Enlarged prostate without lower urinary tract symptoms: Secondary | ICD-10-CM | POA: Diagnosis present

## 2019-10-13 DIAGNOSIS — Z8249 Family history of ischemic heart disease and other diseases of the circulatory system: Secondary | ICD-10-CM

## 2019-10-13 DIAGNOSIS — Z7901 Long term (current) use of anticoagulants: Secondary | ICD-10-CM

## 2019-10-13 DIAGNOSIS — I4819 Other persistent atrial fibrillation: Secondary | ICD-10-CM | POA: Diagnosis not present

## 2019-10-13 DIAGNOSIS — Z955 Presence of coronary angioplasty implant and graft: Secondary | ICD-10-CM | POA: Diagnosis not present

## 2019-10-13 DIAGNOSIS — D72829 Elevated white blood cell count, unspecified: Secondary | ICD-10-CM | POA: Diagnosis present

## 2019-10-13 DIAGNOSIS — E039 Hypothyroidism, unspecified: Secondary | ICD-10-CM

## 2019-10-13 DIAGNOSIS — I4589 Other specified conduction disorders: Secondary | ICD-10-CM | POA: Diagnosis present

## 2019-10-13 DIAGNOSIS — D6859 Other primary thrombophilia: Secondary | ICD-10-CM | POA: Diagnosis not present

## 2019-10-13 DIAGNOSIS — R0789 Other chest pain: Secondary | ICD-10-CM | POA: Diagnosis present

## 2019-10-13 DIAGNOSIS — E782 Mixed hyperlipidemia: Secondary | ICD-10-CM

## 2019-10-13 DIAGNOSIS — I2584 Coronary atherosclerosis due to calcified coronary lesion: Secondary | ICD-10-CM | POA: Diagnosis present

## 2019-10-13 DIAGNOSIS — I071 Rheumatic tricuspid insufficiency: Secondary | ICD-10-CM | POA: Diagnosis present

## 2019-10-13 DIAGNOSIS — K5900 Constipation, unspecified: Secondary | ICD-10-CM | POA: Diagnosis not present

## 2019-10-13 DIAGNOSIS — D472 Monoclonal gammopathy: Secondary | ICD-10-CM | POA: Diagnosis present

## 2019-10-13 DIAGNOSIS — M159 Polyosteoarthritis, unspecified: Secondary | ICD-10-CM | POA: Diagnosis present

## 2019-10-13 DIAGNOSIS — R7989 Other specified abnormal findings of blood chemistry: Secondary | ICD-10-CM

## 2019-10-13 DIAGNOSIS — Z20822 Contact with and (suspected) exposure to covid-19: Secondary | ICD-10-CM | POA: Diagnosis not present

## 2019-10-13 DIAGNOSIS — I10 Essential (primary) hypertension: Secondary | ICD-10-CM | POA: Diagnosis present

## 2019-10-13 DIAGNOSIS — I25118 Atherosclerotic heart disease of native coronary artery with other forms of angina pectoris: Secondary | ICD-10-CM

## 2019-10-13 DIAGNOSIS — I248 Other forms of acute ischemic heart disease: Secondary | ICD-10-CM | POA: Diagnosis not present

## 2019-10-13 DIAGNOSIS — Z8673 Personal history of transient ischemic attack (TIA), and cerebral infarction without residual deficits: Secondary | ICD-10-CM

## 2019-10-13 DIAGNOSIS — D638 Anemia in other chronic diseases classified elsewhere: Secondary | ICD-10-CM | POA: Diagnosis present

## 2019-10-13 DIAGNOSIS — I951 Orthostatic hypotension: Secondary | ICD-10-CM

## 2019-10-13 DIAGNOSIS — I484 Atypical atrial flutter: Secondary | ICD-10-CM | POA: Diagnosis not present

## 2019-10-13 DIAGNOSIS — Z91041 Radiographic dye allergy status: Secondary | ICD-10-CM

## 2019-10-13 DIAGNOSIS — D649 Anemia, unspecified: Secondary | ICD-10-CM

## 2019-10-13 DIAGNOSIS — Z950 Presence of cardiac pacemaker: Secondary | ICD-10-CM

## 2019-10-13 DIAGNOSIS — Z953 Presence of xenogenic heart valve: Secondary | ICD-10-CM

## 2019-10-13 DIAGNOSIS — G709 Myoneural disorder, unspecified: Secondary | ICD-10-CM | POA: Diagnosis present

## 2019-10-13 DIAGNOSIS — I25110 Atherosclerotic heart disease of native coronary artery with unstable angina pectoris: Secondary | ICD-10-CM | POA: Diagnosis present

## 2019-10-13 DIAGNOSIS — I340 Nonrheumatic mitral (valve) insufficiency: Secondary | ICD-10-CM | POA: Diagnosis not present

## 2019-10-13 DIAGNOSIS — I499 Cardiac arrhythmia, unspecified: Secondary | ICD-10-CM | POA: Diagnosis not present

## 2019-10-13 DIAGNOSIS — I361 Nonrheumatic tricuspid (valve) insufficiency: Secondary | ICD-10-CM | POA: Diagnosis not present

## 2019-10-13 LAB — MAGNESIUM: Magnesium: 2.3 mg/dL (ref 1.7–2.4)

## 2019-10-13 LAB — HEPATIC FUNCTION PANEL
ALT: 23 U/L (ref 0–44)
AST: 32 U/L (ref 15–41)
Albumin: 3.9 g/dL (ref 3.5–5.0)
Alkaline Phosphatase: 82 U/L (ref 38–126)
Bilirubin, Direct: 0.2 mg/dL (ref 0.0–0.2)
Indirect Bilirubin: 0.6 mg/dL (ref 0.3–0.9)
Total Bilirubin: 0.8 mg/dL (ref 0.3–1.2)
Total Protein: 7 g/dL (ref 6.5–8.1)

## 2019-10-13 LAB — URINALYSIS, ROUTINE W REFLEX MICROSCOPIC
Bilirubin Urine: NEGATIVE
Glucose, UA: NEGATIVE mg/dL
Hgb urine dipstick: NEGATIVE
Ketones, ur: NEGATIVE mg/dL
Leukocytes,Ua: NEGATIVE
Nitrite: NEGATIVE
Protein, ur: NEGATIVE mg/dL
Specific Gravity, Urine: 1.014 (ref 1.005–1.030)
pH: 6 (ref 5.0–8.0)

## 2019-10-13 LAB — HEMOGLOBIN AND HEMATOCRIT, BLOOD
HCT: 34 % — ABNORMAL LOW (ref 39.0–52.0)
Hemoglobin: 10.6 g/dL — ABNORMAL LOW (ref 13.0–17.0)

## 2019-10-13 LAB — CBC
HCT: 34.7 % — ABNORMAL LOW (ref 39.0–52.0)
Hemoglobin: 11 g/dL — ABNORMAL LOW (ref 13.0–17.0)
MCH: 29.1 pg (ref 26.0–34.0)
MCHC: 31.7 g/dL (ref 30.0–36.0)
MCV: 91.8 fL (ref 80.0–100.0)
Platelets: 192 10*3/uL (ref 150–400)
RBC: 3.78 MIL/uL — ABNORMAL LOW (ref 4.22–5.81)
RDW: 14.4 % (ref 11.5–15.5)
WBC: 11.9 10*3/uL — ABNORMAL HIGH (ref 4.0–10.5)
nRBC: 0 % (ref 0.0–0.2)

## 2019-10-13 LAB — TROPONIN I (HIGH SENSITIVITY)
Troponin I (High Sensitivity): 120 ng/L (ref <18)
Troponin I (High Sensitivity): 833 ng/L (ref <18)
Troponin I (High Sensitivity): 1449 ng/L (ref <18)

## 2019-10-13 LAB — BASIC METABOLIC PANEL
Anion gap: 10 (ref 5–15)
BUN: 15 mg/dL (ref 8–23)
CO2: 23 mmol/L (ref 22–32)
Calcium: 8.9 mg/dL (ref 8.9–10.3)
Chloride: 105 mmol/L (ref 98–111)
Creatinine, Ser: 1.09 mg/dL (ref 0.61–1.24)
GFR calc Af Amer: >60 mL/min (ref >60)
GFR calc non Af Amer: >60 mL/min (ref >60)
Glucose, Bld: 158 mg/dL — ABNORMAL HIGH (ref 70–99)
Potassium: 4 mmol/L (ref 3.5–5.1)
Sodium: 138 mmol/L (ref 135–145)

## 2019-10-13 LAB — SARS CORONAVIRUS 2 BY RT PCR (HOSPITAL ORDER, PERFORMED IN ~~LOC~~ HOSPITAL LAB): SARS Coronavirus 2: NEGATIVE

## 2019-10-13 LAB — LIPASE, BLOOD: Lipase: 21 U/L (ref 11–51)

## 2019-10-13 LAB — PROTIME-INR
INR: 1.4 — ABNORMAL HIGH (ref 0.8–1.2)
Prothrombin Time: 16.5 seconds — ABNORMAL HIGH (ref 11.4–15.2)

## 2019-10-13 MED ORDER — VITAMIN D 25 MCG (1000 UNIT) PO TABS
1000.0000 [IU] | ORAL_TABLET | Freq: Every day | ORAL | Status: DC
  Administered 2019-10-14 – 2019-10-16 (×2): 1000 [IU] via ORAL
  Filled 2019-10-13 (×2): qty 1

## 2019-10-13 MED ORDER — COENZYME Q10 100 MG PO CAPS: 100.0000 mg | ORAL_CAPSULE | Freq: Every day | ORAL | Status: DC

## 2019-10-13 MED ORDER — HYDROCODONE-ACETAMINOPHEN 10-325 MG PO TABS
1.0000 | ORAL_TABLET | Freq: Once | ORAL | Status: AC
  Administered 2019-10-13: 1 via ORAL
  Filled 2019-10-13: qty 1

## 2019-10-13 MED ORDER — METOPROLOL TARTRATE 50 MG PO TABS
50.0000 mg | ORAL_TABLET | Freq: Two times a day (BID) | ORAL | Status: DC
  Administered 2019-10-14 – 2019-10-15 (×3): 50 mg via ORAL
  Filled 2019-10-13 (×3): qty 1

## 2019-10-13 MED ORDER — METOPROLOL SUCCINATE ER 25 MG PO TB24: 50.0000 mg | ORAL_TABLET | Freq: Two times a day (BID) | ORAL | Status: DC

## 2019-10-13 MED ORDER — ROSUVASTATIN CALCIUM 20 MG PO TABS
20.0000 mg | ORAL_TABLET | Freq: Every day | ORAL | Status: DC
  Administered 2019-10-14 – 2019-10-16 (×3): 20 mg via ORAL
  Filled 2019-10-13 (×3): qty 1

## 2019-10-13 MED ORDER — LACTATED RINGERS IV SOLN: INTRAVENOUS | Status: AC

## 2019-10-13 MED ORDER — EZETIMIBE 10 MG PO TABS
10.0000 mg | ORAL_TABLET | Freq: Every day | ORAL | Status: DC
  Administered 2019-10-14 – 2019-10-16 (×3): 10 mg via ORAL
  Filled 2019-10-13 (×3): qty 1

## 2019-10-13 MED ORDER — SODIUM CHLORIDE 0.9 % IV SOLN: INTRAVENOUS | Status: DC

## 2019-10-13 MED ORDER — APIXABAN 5 MG PO TABS
5.0000 mg | ORAL_TABLET | Freq: Two times a day (BID) | ORAL | Status: DC
  Administered 2019-10-14 (×2): 5 mg via ORAL
  Filled 2019-10-13 (×3): qty 1

## 2019-10-13 MED ORDER — HYDROCODONE-ACETAMINOPHEN 5-325 MG PO TABS
1.0000 | ORAL_TABLET | Freq: Four times a day (QID) | ORAL | Status: DC | PRN
  Administered 2019-10-14: 1 via ORAL
  Filled 2019-10-13: qty 1

## 2019-10-13 MED ORDER — MAGNESIUM OXIDE 400 (241.3 MG) MG PO TABS
400.0000 mg | ORAL_TABLET | Freq: Every day | ORAL | Status: DC
  Administered 2019-10-14 – 2019-10-16 (×3): 400 mg via ORAL
  Filled 2019-10-13 (×3): qty 1

## 2019-10-13 MED ORDER — BUTALBITAL-APAP-CAFFEINE 50-325-40 MG PO TABS
1.0000 | ORAL_TABLET | Freq: Two times a day (BID) | ORAL | Status: DC | PRN
  Administered 2019-10-14: 1 via ORAL
  Administered 2019-10-16: 2 via ORAL
  Filled 2019-10-13 (×3): qty 1

## 2019-10-13 MED ORDER — DILTIAZEM HCL 25 MG/5ML IV SOLN
10.0000 mg | Freq: Once | INTRAVENOUS | Status: AC
  Administered 2019-10-13: 10 mg via INTRAVENOUS
  Filled 2019-10-13: qty 5

## 2019-10-13 MED ORDER — METOPROLOL TARTRATE 5 MG/5ML IV SOLN: 2.5000 mg | Freq: Four times a day (QID) | INTRAVENOUS | Status: DC | PRN

## 2019-10-13 MED ORDER — LORAZEPAM 1 MG PO TABS
1.0000 mg | ORAL_TABLET | Freq: Every day | ORAL | Status: DC
  Administered 2019-10-14 – 2019-10-15 (×3): 1 mg via ORAL
  Filled 2019-10-13 (×3): qty 1

## 2019-10-13 MED ORDER — SODIUM CHLORIDE 0.9 % IV BOLUS
250.0000 mL | Freq: Once | INTRAVENOUS | Status: AC
  Administered 2019-10-13: 250 mL via INTRAVENOUS

## 2019-10-13 MED ORDER — ONDANSETRON HCL 4 MG/2ML IJ SOLN: 4.0000 mg | Freq: Four times a day (QID) | INTRAMUSCULAR | Status: DC | PRN

## 2019-10-13 MED ORDER — DOXAZOSIN MESYLATE 4 MG PO TABS
4.0000 mg | ORAL_TABLET | Freq: Every day | ORAL | Status: DC
  Administered 2019-10-14 – 2019-10-15 (×3): 4 mg via ORAL
  Filled 2019-10-13 (×3): qty 1

## 2019-10-13 MED ORDER — HYDROCODONE-ACETAMINOPHEN 10-325 MG PO TABS
2.0000 | ORAL_TABLET | Freq: Four times a day (QID) | ORAL | Status: DC | PRN
  Administered 2019-10-14: 2 via ORAL
  Administered 2019-10-15: 1 via ORAL
  Filled 2019-10-13 (×3): qty 2

## 2019-10-13 MED ORDER — NITROGLYCERIN 0.4 MG SL SUBL: 0.4000 mg | SUBLINGUAL_TABLET | SUBLINGUAL | Status: DC | PRN

## 2019-10-13 MED ORDER — ACETAMINOPHEN 325 MG PO TABS: 650.0000 mg | ORAL_TABLET | ORAL | Status: DC | PRN

## 2019-10-13 MED ORDER — PREDNISONE 5 MG PO TABS
5.0000 mg | ORAL_TABLET | Freq: Every day | ORAL | Status: DC
  Administered 2019-10-14 – 2019-10-16 (×3): 5 mg via ORAL
  Filled 2019-10-13 (×2): qty 1

## 2019-10-13 NOTE — ED Notes (Signed)
Urine sent to lab WITH culture. 

## 2019-10-13 NOTE — ED Provider Notes (Signed)
[facility] EMERGENCY DEPARTMENT Provider Note   CSN: [id number] Arrival date & time: 10/13/19  1139     History Chief Complaint  Patient presents with  . 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  Patient brought in by EMS from [facility].  Patient reported a rapid heart rate on scene.  Parent heart rate was going up to the 180s.  Patient had already taken some of his extra short acting and long-acting beta blockers.  EMS thought it was SVT.  They gave him 6 then 12 and 12 of adenosine unsuccessfully.  Then they gave Cardizem 10 mg IV bolus which slowed his heart rate down to the 80s and 90s.  Shortly after arrival here patient's heart rate was going anywhere from the upper 90s up to 140.  When heart rate would get above 120s he would get chest pain.  Patient stated that he had chest pain during the night.  Patient has a very extensive cardiac history that is very complicated followed by cardiology here.  Also coronary artery disease.  Has a chronotropic incompetence with sinus node dysfunction.  Has a pacemaker.  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  In addition patient was having some diarrhea overnight.  Not having large amounts now.  Kind of mucousy orange and heme positive.  No abdominal pain.        Past Medical History:  Diagnosis Date  . Aortic stenosis    moderate aortic stenosis  . Arthritis   . Benign prostatic hypertrophy   . 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  . Coronary artery disease    status post multiple prior percutaneous coronary interventions,  microvascular angina per Dr Olevia Perches  . Diverticulitis sigmoid colon recurrent   . Heart murmur   . Hypercoagulable state ([place])    chronically anticoagulated with coumadin  . Hyperlipidemia   . Hyperthyroidism   . Hypothyroidism    Dr. Elyse Hsu  . MGUS (monoclonal gammopathy of unknown significance) 02/17/2013  . Paroxysmal atrial fibrillation ([place])    DR. Lia Foyer,   . Prediabetes 09/21/2017  . Stroke [facility])    1990    Patient Active Problem List   Diagnosis Date Noted  . Diverticulitis sigmoid colon recurrent   . Prediabetes 09/21/2017  . Persistent atrial fibrillation ([place]) 04/11/2017  . Vitamin D deficiency 05/20/2016  . History of pacemaker 05/20/2016  . History of stroke/[place]  05/20/2016  . Primary osteoarthritis of both hands 03/10/2016  . DDD cervical spine 03/10/2016  . 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 Chronic left shoulder pain 03/10/2016  . Trochanteric bursitis of right hip 03/10/2016  . Other fatigue 03/10/2016  . Myalgia 03/10/2016  . Other chronic pain 03/10/2016  . 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 Typical atrial flutter ([place])   . PVC's (premature ventricular contractions) 01/30/2015  . Pacemaker 04/30/2013  . MGUS (monoclonal gammopathy of unknown significance) 02/17/2013  . Orthostatic hypotension 04/11/2011  . Long term current use of anticoagulant therapy 03/24/2011  . S/P AVR 03/24/2011  . Pleural effusion 03/24/2011  . Hypothyroidism 11/22/2010  . Atrial flutter ([place]) 08/19/2010  . CHEST PAIN 04/06/2010  .  HYPERLIPIDEMIA-MIXED 12/08/2007  . PRIMARY HYPERCOAGULABLE STATE 12/08/2007  . Coronary artery disease with exertional angina ([place]) 12/08/2007  . CAD, NATIVE VESSEL 12/08/2007  . AORTIC STENOSIS/ INSUFFICIENCY, NON-RHEUMATIC 12/08/2007  . Atrial fibrillation ([place]) 12/08/2007  . Chronotropic incompetence with sinus node dysfunction (HCC) 12/08/2007    Past Surgical History:  Procedure Laterality Date  . AORTIC VALVE  REPLACEMENT  03/15/2011   Procedure: AORTIC VALVE REPLACEMENT (AVR);  Surgeon: Gaye Pollack, MD;  Location: [facility];  Service: Open Heart Surgery;  Laterality: N/A;  . APPENDECTOMY    . CARDIAC CATHETERIZATION     11  . CARDIOVERSION    . CARDIOVERSION  04/15/2011   Procedure: CARDIOVERSION;  Surgeon: Loralie Champagne, MD;  Location: [facility];  Service: Cardiovascular;  Laterality: N/A;  . CARDIOVERSION N/A 09/11/2014   Procedure: CARDIOVERSION;  Surgeon: Sueanne Margarita, MD;  Location: [facility] ENDOSCOPY;  Service: Cardiovascular;  Laterality: N/A;  . CARDIOVERSION N/A 06/27/2015   Procedure: CARDIOVERSION;  Surgeon: Thayer Headings, MD;  Location: [facility] ENDOSCOPY;  Service: Cardiovascular;  Laterality: N/A;  . CARDIOVERSION N/A 07/04/2015   Procedure: CARDIOVERSION;  Surgeon: Evans Lance, MD;  Location: [place] [facility];  Service: Cardiovascular;  Laterality: N/A;  . CARDIOVERSION N/A 04/13/2017   Procedure: CARDIOVERSION;  Surgeon: Sueanne Margarita, MD;  Location: [facility] ENDOSCOPY;  Service: Cardiovascular;  Laterality: N/A;  . COLONOSCOPY    . EP IMPLANTABLE DEVICE N/A 06/16/2015   Procedure: Pacemaker Implant;  Surgeon: Evans Lance, MD;  Location: [facility] CV LAB;  Service: Cardiovascular;  Laterality: N/A;  . hemrrhoidectomy    . LEFT AND RIGHT HEART CATHETERIZATION WITH CORONARY ANGIOGRAM Bilateral 02/01/2011   Procedure: LEFT AND RIGHT HEART CATHETERIZATION WITH CORONARY ANGIOGRAM;  Surgeon: Hillary Bow, MD;  Location: [facility] CATH LAB;  Service: Cardiovascular;  Laterality: Bilateral;  . MAZE  03/15/2011   Procedure: MAZE;  Surgeon: Gaye Pollack, MD;  Location: [facility];  Service: Open Heart Surgery;  Laterality: N/A;  . PACEMAKER INSERTION  1991   Guidant PPM, most recent Generator Change by Dr Olevia Perches was 08/22/06  . RIGHT/LEFT HEART CATH AND CORONARY ANGIOGRAPHY N/A 07/06/2016   Procedure: Right/Left Heart Cath and Coronary Angiography;  Surgeon: Sherren Mocha, MD;  Location: [place] CV LAB;   Service: Cardiovascular;  Laterality: N/A;  . TEE WITHOUT CARDIOVERSION  04/15/2011   Procedure: TRANSESOPHAGEAL ECHOCARDIOGRAM (TEE);  Surgeon: Loralie Champagne, MD;  Location: [facility] [place];  Service: Cardiovascular;  Laterality: N/A;  . TEE WITHOUT CARDIOVERSION N/A 09/11/2014   Procedure: TRANSESOPHAGEAL ECHOCARDIOGRAM (TEE);  Surgeon: Sueanne Margarita, MD;  Location: [facility] ENDOSCOPY;  Service: Cardiovascular;  Laterality: N/A;       Family History  Problem Relation Age of Onset  . Heart disease Brother        Twin brother has coronary disease and recent AVR for AS  . Anesthesia problems Neg Hx   . Hypotension Neg Hx   . Malignant hyperthermia Neg Hx   . Pseudochol deficiency Neg Hx     Social History   Tobacco Use  . Smoking status: Never Smoker  . Smokeless tobacco: Never Used  Vaping Use  . Vaping Use: Never used  Substance Use Topics  . Alcohol use: No    Alcohol/week: 0.0 standard drinks  . Drug use: No    Home Medications Prior to Admission medications   Medication Sig Start Date End Date Taking? Authorizing Provider  bisacodyl (DULCOLAX) 5 MG EC tablet Take 5 mg by mouth as  needed (constipation).    Yes [provider]  butalbital-acetaminophen-caffeine (FIORICET, ESGIC) 50-325-40 MG per tablet Take 1 tablet by mouth 2 (two) times daily as needed (osteo-arthritis pain).  02/28/13  Yes [provider]  Cholecalciferol (VITAMIN D3) 2000 units capsule Take 1,000 Units by mouth daily.    Yes [provider]  Coenzyme Q10 100 MG capsule Take 100 mg by mouth daily.    Yes [provider]  Diclofenac Sodium 1.5 % SOLN Take 1 application by mouth 2 (two) times daily as needed (Knee pain).  02/28/13  Yes [provider]  doxazosin (CARDURA) 4 MG tablet Take 4 mg by mouth at bedtime.    Yes [provider]  ELIQUIS 5 MG TABS tablet TAKE 1 TABLET BY MOUTH 2 TIMES DAILY. Patient taking differently: Take 5 mg by mouth 2 (two) times  daily.  04/26/19  Yes Evans Lance, MD  ezetimibe (ZETIA) 10 MG tablet Take 1 tablet (10 mg total) by mouth daily. 10/31/18  Yes Evans Lance, MD  HYDROcodone-acetaminophen (NORCO) 10-325 MG tablet TAKE 1 TABLET BY MOUTH TWICE DAILY AS NEEDED FOR MODERATE PAIN 09/21/19  Yes Ofilia Neas, PA-C  LORazepam (ATIVAN) 1 MG tablet Take 1 mg by mouth at bedtime.  01/05/12  Yes Hillary Bow, MD  magnesium oxide (MAG-OX) 400 MG tablet Take 400 mg by mouth daily.   Yes [provider]  metoprolol succinate (TOPROL-XL) 50 MG 24 hr tablet Take 50 mg by mouth in the morning and at bedtime. Take with or immediately following a meal.   Yes [provider]  metoprolol tartrate (LOPRESSOR) 50 MG tablet Take one tablet by mouth as needed for chest pain/palpitations.  May take up to 3 tabs daily. 10/31/18  Yes Evans Lance, MD  nitroGLYCERIN (NITROSTAT) 0.4 MG SL tablet PLACE 1 TABLET UNDER THE TONGUE EVERY 5 MINUTES AS NEEDED FOR CHEST PAIN. Patient taking differently: Place 0.4 mg under the tongue every 5 (five) minutes as needed for chest pain.  05/09/19  Yes Sherren Mocha, MD  OVER THE COUNTER MEDICATION Apply 1 patch topically daily as needed (neck and shoulder pain). Chinese pain patch   Yes [provider]  potassium chloride SA (KLOR-CON) 20 MEQ tablet TAKE 2 TABLETS BY MOUTH DAILY. Patient taking differently: Take 20 mEq by mouth daily.  05/09/19  Yes Sherren Mocha, MD  predniSONE (DELTASONE) 5 MG tablet Take 5 mg by mouth daily with breakfast.   Yes [provider]  rosuvastatin (CRESTOR) 20 MG tablet TAKE 1 TABLET BY MOUTH AT BEDTIME. Patient taking differently: Take 20 mg by mouth daily.  05/09/19  Yes Sherren Mocha, MD    Allergies    Contrast media [iodinated diagnostic agents], Gadolinium derivatives, and Metrizamide  Review of Systems   Review of Systems  Constitutional: Negative for chills and fever.  HENT: Negative for congestion, rhinorrhea and  sore throat.   Eyes: Negative for visual disturbance.  Respiratory: Negative for cough and shortness of breath.   Cardiovascular: Positive for chest pain and palpitations. Negative for leg swelling.  Gastrointestinal: Positive for blood in stool and diarrhea. Negative for abdominal pain, nausea and vomiting.  Genitourinary: Negative for dysuria.  Musculoskeletal: Negative for back pain and neck pain.  Skin: Negative for rash.  Neurological: Negative for dizziness, light-headedness and headaches.  Hematological: Does not bruise/bleed easily.  Psychiatric/Behavioral: Negative for confusion.    Physical Exam Updated Vital Signs BP 129/70   Pulse (!) 106  Temp 98.1 F (36.7 C)   Resp 15   Ht 1.829 m (6')   Wt 100.7 kg   SpO2 97%   BMI 30.11 kg/m   Physical Exam Vitals and nursing note reviewed.  Constitutional:      General: He is not in acute distress.    Appearance: Normal appearance. He is well-developed. He is ill-appearing.  HENT:     Head: Normocephalic and atraumatic.  Eyes:     Extraocular Movements: Extraocular movements intact.     Conjunctiva/sclera: Conjunctivae normal.     Pupils: Pupils are equal, round, and reactive to light.  Cardiovascular:     Rate and Rhythm: Tachycardia present. Rhythm irregular.     Heart sounds: No murmur heard.   Pulmonary:     Effort: Pulmonary effort is normal. No respiratory distress.     Breath sounds: Normal breath sounds.  Abdominal:     Palpations: Abdomen is soft.     Tenderness: There is no abdominal tenderness.  Musculoskeletal:        General: Normal range of motion.     Cervical back: Normal range of motion and neck supple.  Skin:    General: Skin is warm and dry.  Neurological:     General: No focal deficit present.     Mental Status: He is alert and oriented to person, place, and time.     Cranial Nerves: No cranial nerve deficit.     Sensory: No sensory deficit.     Motor: No weakness.     ED Results /  Procedures / Treatments   Labs (all labs ordered are listed, but only abnormal results are displayed) Labs Reviewed  BASIC METABOLIC PANEL - Abnormal; Notable for the following components:      Result Value   Glucose, Bld 158 (*)    All other components within normal limits  CBC - Abnormal; Notable for the following components:   WBC 11.9 (*)    RBC 3.78 (*)    Hemoglobin 11.0 (*)    HCT 34.7 (*)    All other components within normal limits  PROTIME-INR - Abnormal; Notable for the following components:   Prothrombin Time 16.5 (*)    INR 1.4 (*)    All other components within normal limits  HEMOGLOBIN AND HEMATOCRIT, BLOOD - Abnormal; Notable for the following components:   Hemoglobin 10.6 (*)    HCT 34.0 (*)    All other components within normal limits  TROPONIN I (HIGH SENSITIVITY) - Abnormal; Notable for the following components:   Troponin I (High Sensitivity) 120 (*)    All other components within normal limits  TROPONIN I (HIGH SENSITIVITY) - Abnormal; Notable for the following components:   Troponin I (High Sensitivity) 833 (*)    All other components within normal limits  TROPONIN I (HIGH SENSITIVITY) - Abnormal; Notable for the following components:   Troponin I (High Sensitivity) 1,449 (*)    All other components within normal limits  SARS CORONAVIRUS 2 BY RT PCR (HOSPITAL ORDER, [place] [facility])  C DIFFICILE QUICK SCREEN W PCR REFLEX  HEPATIC FUNCTION PANEL  LIPASE, BLOOD  URINALYSIS, ROUTINE W REFLEX MICROSCOPIC  TSH  MAGNESIUM    EKG EKG Interpretation  Date/Time:  Saturday October 13 2019 12:10:40 EDT Ventricular Rate:  97 PR Interval:    QRS Duration: 152 QT Interval:  409 QTC Calculation: 515 R Axis:   -55 Text Interpretation: Atrial fibrillation Right bundle branch block LVH with  IVCD and secondary repol abnrm Prolonged QT interval Confirmed by Fredia Sorrow [phone]) on 10/13/2019 6:15:29 PM   Radiology DG Chest Portable 1  View  Result Date: 10/13/2019 CLINICAL DATA:  Supraventricular tachycardia. EXAM: PORTABLE CHEST 1 VIEW COMPARISON:  Chest radiograph dated 02/15/2018. FINDINGS: The heart size is mildly enlarged, unchanged. Both lungs are clear. The visualized skeletal structures are unremarkable. Bilateral subclavian cardiac devices are redemonstrated. Median sternotomy wires, an aortic valve prosthesis, and an atrial appendage clip are redemonstrated. IMPRESSION: No active disease. Electronically Signed   By: Zerita Boers M.D.   On: 10/13/2019 13:20    Procedures Procedures (including critical care time)CRITICAL CARE Performed by: Fredia Sorrow Total critical care time: 45 minutes Critical care time was exclusive of separately billable procedures and treating other patients. Critical care was necessary to treat or prevent imminent or life-threatening deterioration. Critical care was time spent personally by me on the following activities: development of treatment plan with patient and/or surrogate as well as nursing, discussions with consultants, evaluation of patient's response to treatment, examination of patient, obtaining history from patient or surrogate, ordering and performing treatments and interventions, ordering and review of laboratory studies, ordering and review of radiographic studies, pulse oximetry and re-evaluation of patient's condition.   Medications Ordered in ED Medications  0.9 %  sodium chloride infusion ( Intravenous Rate/Dose Change 10/13/19 1634)  HYDROcodone-acetaminophen (NORCO) 10-325 MG per tablet 1 tablet (has no administration in time range)  diltiazem (CARDIZEM) injection 10 mg (10 mg Intravenous Given 10/13/19 1519)  sodium chloride 0.9 % bolus 250 mL (250 mLs Intravenous New Bag/Given 10/13/19 1524)    ED Course  I have reviewed the triage vital signs and the nursing notes.  Pertinent labs & imaging results that were available during my care of the patient were reviewed  by me and considered in my medical decision making (see chart for details).    MDM Rules/Calculators/A&P                         Initially after getting patient's complicated cardiac history.  Did give cardiology a call Dr. Caryl Comes was on his way in to see patient.  Heart rate was starting to get up to around 140s with chest discomfort again.  So gave another 10 mg of Cardizem.  Which helped bring his heart rate back.  But as that would kind of wear off then his heart rate would kind of go anywhere from upper 90s to the 140s.  We did not get any more the 180s.  Dr. Caryl Comes felt that maybe the diarrhea and some dehydration was kind of triggering the tachycardia.  Covid was checked and that was negative.  Also he had a little bit of the diarrhea was some bloody mucus.  Hemoglobin had changed slightly from 12-11.  No abdominal pain.  C. difficile was ordered since patient's been on doxycycline for a number of days secondary to a biopsy that occurred on his head.  Chest x-ray without any significant findings.  Renal function without any significant abnormalities.  However patient's troponin which was ordered later but drawn run off the original blood came back at 120 so therefore delta was required next one was 880 and and the one after that was 1500.  With 880 troponin Dr. Golden Hurter was called from cardiology she did not recommend anticoagulating him with heparin he is already on Eliquis.  But did recommend medical admission particularly in face of the diarrhea.  Repeat hemoglobin dropped down to around 10 but no significant change there.  Patient has not had any pooling of blood with the bowel movements.  Patient is chest pain-free when the heart rate is down below 120.  Gets chest discomfort when above apparently had a lot just chest discomfort overnight. Gust with the hospitalist they will admit Dr. Radford Pax said she will see the patient in person and consult.  Final Clinical Impression(s) / ED  Diagnoses Final diagnoses:  Atrial fibrillation with RVR ([place])  Elevated troponin    Rx / DC Orders ED Discharge Orders    None       Fredia Sorrow, MD 10/13/19 2208

## 2019-10-13 NOTE — Consult Note (Addendum)
ELECTROPHYSIOLOGY CONSULT NOTE  Patient ID: Kevin Buff, MD, MRN: [id number], DOB/AGE: February 01, 1935 85 y.o. 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  He is seen today because of the development of 1: 1 conduction in the context of abrupt onset voluminous and watery diarrhea.  Adenosine and diltiazem have been associated with slowing.  One-to-one conduction was very symptomatic hypotensive, chest pain, diaphoretic and ashen.  Stool was associated with some blood  P.o. intake has been decreased of late.  Also, status post Mohs procedure on his scalp that was complicated by infection.  Now 10 days in on antibiotics  Fluid intake is generally pretty good.  He has significant orthostatic hypotension, normally without orthostatic tachycardia.  Blood pressures 140--80 with standing.  Presyncope.  Unable to exercise.  Does not recall evaluation for orthostasis specifically evalu for amyloid.   no dry eyes/dry mouth or constipation  Diagnosed with ocular myasthenia;  Venous stasis and wears compressive stockings      Past Medical History:  Diagnosis Date  . Aortic stenosis    moderate aortic stenosis  . Arthritis   . Benign prostatic hypertrophy   . 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  . Coronary artery disease    status post multiple prior percutaneous coronary interventions, microvascular angina per Dr Olevia Perches   . Diverticulitis sigmoid colon recurrent   . Heart murmur   . Hypercoagulable state ([place])    chronically anticoagulated with coumadin  . Hyperlipidemia   . Hyperthyroidism   . Hypothyroidism    Dr. Elyse Hsu  . MGUS (monoclonal gammopathy of unknown significance) 02/17/2013  . Paroxysmal atrial fibrillation ([place])    DR. Lia Foyer,   . Prediabetes 09/21/2017  . Stroke [facility])    1990      Surgical History:  Past Surgical History:  Procedure Laterality Date  . AORTIC VALVE REPLACEMENT  03/15/2011   Procedure: AORTIC VALVE REPLACEMENT (AVR);  Surgeon: Gaye Pollack, MD;  Location: [place];  Service: Open Heart Surgery;  Laterality: N/A;  . APPENDECTOMY    . CARDIAC CATHETERIZATION     11  . CARDIOVERSION    . CARDIOVERSION  04/15/2011   Procedure: CARDIOVERSION;  Surgeon: Loralie Champagne, MD;  Location: [place];  Service: Cardiovascular;  Laterality: N/A;  . CARDIOVERSION N/A 09/11/2014   Procedure: CARDIOVERSION;  Surgeon: Sueanne Margarita, MD;  Location: [facility] ENDOSCOPY;  Service: Cardiovascular;  Laterality: N/A;  . CARDIOVERSION N/A 06/27/2015   Procedure: CARDIOVERSION;  Surgeon: Thayer Headings, MD;  Location: [facility] ENDOSCOPY;  Service: Cardiovascular;  Laterality: N/A;  . CARDIOVERSION N/A 07/04/2015   Procedure: CARDIOVERSION;  Surgeon: Evans Lance, MD;  Location: [place];  Service: Cardiovascular;  Laterality: N/A;  . CARDIOVERSION N/A 04/13/2017   Procedure: CARDIOVERSION;  Surgeon: Sueanne Margarita, MD;  Location: [facility] ENDOSCOPY;  Service: Cardiovascular;  Laterality: N/A;  . COLONOSCOPY    . EP IMPLANTABLE DEVICE N/A 06/16/2015   Procedure: Pacemaker  Implant;  Surgeon: Evans Lance, MD;  Location: [place] CV LAB;  Service: Cardiovascular;  Laterality: N/A;  . hemrrhoidectomy    . LEFT AND RIGHT HEART CATHETERIZATION WITH CORONARY ANGIOGRAM Bilateral 02/01/2011   Procedure: LEFT AND RIGHT HEART CATHETERIZATION WITH CORONARY ANGIOGRAM;  Surgeon: Hillary Bow, MD;   Location: [facility] CATH LAB;  Service: Cardiovascular;  Laterality: Bilateral;  . MAZE  03/15/2011   Procedure: MAZE;  Surgeon: Gaye Pollack, MD;  Location: [place];  Service: Open Heart Surgery;  Laterality: N/A;  . PACEMAKER INSERTION  1991   Guidant PPM, most recent Generator Change by Dr Olevia Perches was 08/22/06  . RIGHT/LEFT HEART CATH AND CORONARY ANGIOGRAPHY N/A 07/06/2016   Procedure: Right/Left Heart Cath and Coronary Angiography;  Surgeon: Sherren Mocha, MD;  Location: [place] CV LAB;  Service: Cardiovascular;  Laterality: N/A;  . TEE WITHOUT CARDIOVERSION  04/15/2011   Procedure: TRANSESOPHAGEAL ECHOCARDIOGRAM (TEE);  Surgeon: Loralie Champagne, MD;  Location: [place];  Service: Cardiovascular;  Laterality: N/A;  . TEE WITHOUT CARDIOVERSION N/A 09/11/2014   Procedure: TRANSESOPHAGEAL ECHOCARDIOGRAM (TEE);  Surgeon: Sueanne Margarita, MD;  Location: [facility] ENDOSCOPY;  Service: Cardiovascular;  Laterality: N/A;     Home Meds: Prior to Admission medications   Medication Sig Start Date End Date Taking? Authorizing Provider  bisacodyl (DULCOLAX) 5 MG EC tablet Take 5 mg by mouth as needed (constipation).     [provider]  butalbital-acetaminophen-caffeine (FIORICET, ESGIC) 50-325-40 MG per tablet Take 1 tablet by mouth 2 (two) times daily as needed (osteo-arthritis pain).  02/28/13   [provider]  Cholecalciferol (VITAMIN D3) 2000 units capsule Take 1,000 Units by mouth daily.     [provider]  Coenzyme Q10 100 MG capsule Take 1 tablet once a day    [provider]  Diclofenac Sodium 1.5 % SOLN Take 1 application by mouth 2 (two) times daily as needed (Knee pain).  02/28/13   [provider]  doxazosin (CARDURA) 4 MG tablet Take 4 mg by mouth at bedtime.     [provider]  ELIQUIS 5 MG TABS tablet TAKE 1 TABLET BY MOUTH 2 TIMES DAILY. 04/26/19   Evans Lance, MD  ezetimibe (ZETIA) 10 MG tablet Take 1 tablet (10 mg total) by mouth daily.  10/31/18   Evans Lance, MD  HYDROcodone-acetaminophen (NORCO) 10-325 MG tablet TAKE 1 TABLET BY MOUTH TWICE DAILY AS NEEDED FOR MODERATE PAIN 09/21/19   Ofilia Neas, PA-C  LORazepam (ATIVAN) 1 MG tablet Take 1 mg by mouth at bedtime.  01/05/12   Hillary Bow, MD  magnesium oxide (MAG-OX) 400 MG tablet Take 400 mg by mouth daily.    [provider]  metoprolol succinate (TOPROL-XL) 50 MG 24 hr tablet Take 50 mg by mouth in the morning and at bedtime. Take with or immediately following a meal.    [provider]  metoprolol tartrate (LOPRESSOR) 50 MG tablet Take one tablet by mouth as needed for chest pain/palpitations.  May take up to 3 tabs daily. 10/31/18   Evans Lance, MD  nitroGLYCERIN (NITROSTAT) 0.4 MG SL tablet PLACE 1 TABLET UNDER THE TONGUE EVERY 5 MINUTES AS NEEDED FOR CHEST PAIN. 05/09/19   Sherren Mocha, MD  OVER THE COUNTER MEDICATION Apply 1 patch topically daily as needed (neck and shoulder pain). Chinese pain patch    [provider]  potassium chloride SA (KLOR-CON) 20 MEQ tablet TAKE 2 TABLETS BY MOUTH DAILY. 05/09/19  Sherren Mocha, MD  predniSONE (DELTASONE) 5 MG tablet Take 5 mg by mouth daily with breakfast.    [provider]  rosuvastatin (CRESTOR) 20 MG tablet TAKE 1 TABLET BY MOUTH AT BEDTIME. 05/09/19   Sherren Mocha, MD      Allergies:  Allergies  Allergen Reactions  . Contrast Media [Iodinated Diagnostic Agents] Hives  . Gadolinium Derivatives Hives  . Metrizamide Hives    Social History   Socioeconomic History  . Marital status: Married    Spouse name: Not on file  . Number of children: Not on file  . Years of education: Not on file  . Highest education level: Not on file  Occupational History  . Occupation: Retired    Comment: Physician  Tobacco Use  . Smoking status: Never Smoker  . Smokeless tobacco: Never Used  Vaping Use  . Vaping Use: Never used  Substance and Sexual Activity  . Alcohol use: No     Alcohol/week: 0.0 standard drinks  . Drug use: No  . Sexual activity: Not on file  Other Topics Concern  . Not on file  Social History Narrative   Married to [place]. Has grown children   Retired Horticulturist, commercial MD      Never smoker no alcohol      Social Determinants of Radio broadcast assistant Strain:   . Difficulty of Paying Living Expenses:   Food Insecurity:   . Worried About Charity fundraiser in the Last Year:   . Arboriculturist in the Last Year:   Transportation Needs:   . Film/video editor (Medical):   Marland Kitchen Lack of Transportation (Non-Medical):   Physical Activity:   . Days of Exercise per Week:   . Minutes of Exercise per Session:   Stress:   . Feeling of Stress :   Social Connections:   . Frequency of Communication with Friends and Family:   . Frequency of Social Gatherings with Friends and Family:   . Attends Religious Services:   . Active Member of Clubs or Organizations:   . Attends Archivist Meetings:   Marland Kitchen Marital Status:   Intimate Partner Violence:   . Fear of Current or Ex-Partner:   . Emotionally Abused:   Marland Kitchen Physically Abused:   . Sexually Abused:      Family History  Problem Relation Age of Onset  . Heart disease Brother        Twin brother has coronary disease and recent AVR for AS  . Anesthesia problems Neg Hx   . Hypotension Neg Hx   . Malignant hyperthermia Neg Hx   . Pseudochol deficiency Neg Hx      ROS:  Please see the history of present illness.     All other systems reviewed and negative.    Physical Exam:  Blood pressure (!) 148/85, pulse 96, temperature 98.1 F (36.7 C), resp. rate 20, height 6' (1.829 m), weight 100.7 kg, SpO2 100 %. General: Well developed, well nourished male in no acute distress. Head: Normocephalic, atraumatic, sclera non-icteric, no xanthomas, nares are without discharge. EENT: Right eye ptosis Lymph Nodes:  none Back: without scoliosis/kyphosis , no CVA tendersness Neck: Negative for  carotid bruits. JVD 7 cm Lungs: Clear bilaterally to auscultation without wheezes, rales, or rhonchi. Breathing is unlabored. Heart: Irregular rate and rhythm with a 2/6 systolic murmur , rubs, or gallops appreciated. Abdomen: Soft, non-tender, non-distended with normoactive bowel sounds. No hepatomegaly. No rebound/guarding. No obvious abdominal masses.  Msk:  Strength and tone appear normal for age. Extremities: No clubbing or cyanosis. No edema.  Distal pedal pulses are 2+ and equal bilaterally. Skin: Warm and Dry Neuro: Alert and oriented X 3. CN III-XII intact Grossly normal sensory and motor function . Psych:  Responds to questions appropriately with a normal affect.      Labs: Cardiac Enzymes No results for input(s): CKTOTAL, CKMB, TROPONINI in the last 72 hours. CBC Lab Results  Component Value Date   WBC 11.9 (H) 10/13/2019   HGB 11.0 (L) 10/13/2019   HCT 34.7 (L) 10/13/2019   MCV 91.8 10/13/2019   PLT 192 10/13/2019   PROTIME: Recent Labs    10/13/19 1158  LABPROT 16.5*  INR 1.4*   Chemistry  Recent Labs  Lab 10/13/19 1158  NA 138  K 4.0  CL 105  CO2 23  BUN 15  CREATININE 1.09  CALCIUM 8.9  GLUCOSE 158*   Lipids Lab Results  Component Value Date   CHOL 174 08/10/2018   HDL 60 08/10/2018   LDLCALC 84 08/10/2018   TRIG 148 08/10/2018   BNP Pro B Natriuretic peptide (BNP)  Date/Time Value Ref Range Status  05/09/2018 09:30 AM 141.0 (H) 0.0 - 100.0 pg/mL Final  10/04/2013 11:29 AM 56.0 0.0 - 100.0 pg/mL Final   NT-Pro BNP  Date/Time Value Ref Range Status  01/31/2019 12:20 PM 842 (H) 0 - 486 pg/mL Final    Comment:    The following cut-points have been suggested for the use of proBNP for the diagnostic evaluation of heart failure (HF) in patients with acute dyspnea: Modality                     Age           Optimal Cut                            (years)            Point ------------------------------------------------------ Diagnosis (rule in  HF)        <50            450 pg/mL                           50 - 75            900 pg/mL                               >75           1800 pg/mL Exclusion (rule out HF)  Age independent     300 pg/mL    Thyroid Function Tests: No results for input(s): TSH, T4TOTAL, T3FREE, THYROIDAB in the last 72 hours.  Invalid input(s): FREET3    Miscellaneous No results found for: DDIMER  Radiology/Studies:  DG Chest Portable 1 View  Result Date: 10/13/2019 CLINICAL DATA:  Supraventricular tachycardia. EXAM: PORTABLE CHEST 1 VIEW COMPARISON:  Chest radiograph dated 02/15/2018. FINDINGS: The heart size is mildly enlarged, unchanged. Both lungs are clear. The visualized skeletal structures are unremarkable. Bilateral subclavian cardiac devices are redemonstrated. Median sternotomy wires, an aortic valve prosthesis, and an atrial appendage clip are redemonstrated. IMPRESSION: No active disease. Electronically Signed   By: Zerita Boers M.D.   On: 10/13/2019 13:20    EKG: atrial tach CL at 320  msec  RBBB with variable conduction    Assessment and Plan:  Atrial tachycardia 1: 1 conduction  Chest pain associated with the above  Diarrheal illness-acute  Resolving infection following scalp Mohs procedure  Ocular myasthenia  Hypertension  Orthostatic hypotension  Pacemaker-[employer]  Anemia-mild  Coronary artery disease/microvascular angina  Status post AVR/maze  IgM gammopathy chronic anticoagulation   The patient presented with the abrupt onset of atrial tachycardia with one-to-one conduction; normally he is well controlled with 2: 1 conduction.  This occurred in the wake of acute diarrhea developing in the middle of the night.  Recurrent tachycardia was noted today in the emergency room with a bowel movement at about 10.  Dietary intake has been down over the last week or so.  Has longstanding history of orthostatic intolerance with systolic blood pressures in the 80s upon  standing and presyncope.  Has been diagnosed with ocular myasthenia; hence, the role of Mestinon might be particularly attractive.  However, given his complex medical regime, would favor compressive therapies.  Discussed that calf compression may help with his venous stasis but would not be expected to help with his orthostasis.  Thigh compression and abdominal binders are more likely to be useful.  With his IgM gammopathy, I wonder whether that puts him at increased risk for amyloid which might explain his orthostasis. Will send message to Julieanne Manson to consider    In the short-term with his diarrhea has suggested he use a bedside commode.  Following resolution of the acute illness, we will obtain some orthostatic vital signs at home before and after compression wear.  We will then plan a telehealth visit in about 2 weeks to consider pharmacotherapy. There was some blood in his stool; hemoglobin is relatively stable 11<<-12.5 or so; this will need to continue to be followed.   In the acute phase, aggressive beta-blockade is not indicated as his tachycardia is at least part compensatory.  However, given the fact that he is rate control is mediated by AV nodal conduction and his options are 2: 1 versus 1: 1, low-dose beta-blockade is appropriate.  We will have him resume his metoprolol at 50 mg tartrate twice daily  Chest pain almost certainly demand in the setting of his tachycardia coronary disease and microvascular angina no work-up is currently indicated.  Could his diarrhea be C Diff   Spoke with ED  They will assess  For now awaiting  1) continue IV fluid repletion--perhaps 500 cc prior to discharge I spoke about the role of NUUN, Pedialyte advance care and liquid IV as preferred volume repletion strategies as opposed to Gatorade given her higher sodium content.  2) resume home BB but would use tartrate 50 bid for the first 24 hrs until diarrhea resolves  3) bedside commode  4)  C  diff assessment  5) compressive wear of abdomen and thighs for OI  6) recumbent exercise-- bike  And CUBII   7) will arrange telehealth visit  In 2 weeks       Virl Axe

## 2019-10-13 NOTE — ED Triage Notes (Signed)
Arrived via EMS from [place]. Reported SVT on scene. EMS gave 6, 12, 12 of adenosine unsuccessful. Cardizem 10 mg slowed rate down from 180 to 80's-90's. Patient arrived in room A&Ox4. Marland Kitchen

## 2019-10-13 NOTE — Progress Notes (Signed)
PHARMACIST - PHYSICIAN ORDER COMMUNICATION  CONCERNING: P&T Medication Policy on Herbal Medications  DESCRIPTION:  This patient's order for:  Co Enzyme Q10  has been noted.  This product(s) is classified as an "herbal" or natural product. Due to a lack of definitive safety studies or FDA approval, nonstandard manufacturing practices, plus the potential risk of unknown drug-drug interactions while on inpatient medications, the Pharmacy and Therapeutics Committee does not permit the use of "herbal" or natural products of this type within [place].   ACTION TAKEN: The pharmacy department is unable to verify this order at this time and your patient has been informed of this safety policy. Please reevaluate patient's clinical condition at discharge and address if the herbal or natural product(s) should be resumed at that time.   

## 2019-10-13 NOTE — H&P (Signed)
History and Physical    Kevin Buff, MD JGO:[id number] DOB: Mar 16, 1934 DOA: 10/13/2019  PCP: [name], MD  Patient coming from: Home   Chief Complaint:  Chief Complaint  Patient presents with  . Irregular Heart Beat     HPI:    85 year old male with past medical history of persistent atrial fibrillation status post pacemaker placement, aortic stenosis status post aortic valve replacement 2012, coronary artery disease status post PCI and stent placement (last cath 2018), hyperlipidemia, previous CVA, hypothyroidism (although patient reports no current Tx), MGUS, chronic pain syndrome who presents to [facility] emergency department with complaints of chest discomfort, lightheadedness and weakness.  Patient explains that for approximately the past week he has been experiencing generalized mild weakness and malaise.  Patient also complains of some associated myalgias although he is unable to provide further details concerning this.  Over the same span of time the patient is also been experiencing decreased oral intake.  Patient denies any associated abdominal pain nausea or vomiting.  Patient denies any dysuria.  Patient states that over this past week he was actually constipated, prompting him to take several laxatives the evening of 8/13.  The morning of 8/14 the patient began to experience several small-volume watery bowel movements.  After having several uncomfortable watery episodes of diarrhea patient began to develop severe lightheadedness and worsening generalized weakness.  Patient also began to experience associated palpitations and chest discomfort that he states is moderate to severe intensity, midsternal, pressure-like in quality and radiating to the neck and down the left arm.  The symptoms lasted for approximately 4 hours after onset.  Patient states that his apple watch stated that he was in atrial fibrillation with heart rates in excess of 140 bpm.  EMS was  then contacted.  EMS confirmed that the patient was indeed in rapid atrial fibrillation with heart rates approaching 180 bpm.  In route to [facility] emergency department, several rounds of adenosine were administered followed by 10 mg of IV diltiazem resulting in improvement in his heart rate.  Upon evaluation in the emergency department, patient was found to have markedly elevated troponins of 120 followed by 833.  Patient was then evaluated by Dr. Caryl Comes with cardiology.  At the time, Dr. Caryl Comes recommended conservative management with intravenous volume resuscitation, continued close monitoring and work-up of patient's diarrhea if indicated.  In the hours that followed, serial troponin continued to rise to 1449.  Case was rediscussed with cardiology (thist time Dr. Radford Pax) who stated that they would come and reevaluate the patient this evening.  The hospitalist group was then called to assess the patient for admission to the hospital.   Review of Systems:   Review of Systems  Constitutional: Positive for malaise/fatigue.  Cardiovascular: Positive for chest pain and palpitations.  Gastrointestinal: Positive for constipation and diarrhea.  Neurological: Positive for weakness.  All other systems reviewed and are negative.     Past Medical History:  Diagnosis Date  . Aortic stenosis    moderate aortic stenosis  . Arthritis   . Benign prostatic hypertrophy   . 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  . Coronary artery disease    status post multiple prior percutaneous coronary interventions, microvascular angina per Dr Olevia Perches  . Diverticulitis sigmoid colon recurrent   . Heart murmur   . Hypercoagulable state ([place])  chronically anticoagulated with coumadin  . Hyperlipidemia   . Hyperthyroidism   . Hypothyroidism     Dr. Elyse Hsu  . MGUS (monoclonal gammopathy of unknown significance) 02/17/2013  . Paroxysmal atrial fibrillation ([place])    DR. Lia Foyer,   . Prediabetes 09/21/2017  . Stroke [facility])    1990    Past Surgical History:  Procedure Laterality Date  . AORTIC VALVE REPLACEMENT  03/15/2011   Procedure: AORTIC VALVE REPLACEMENT (AVR);  Surgeon: Gaye Pollack, MD;  Location: [place];  Service: Open Heart Surgery;  Laterality: N/A;  . APPENDECTOMY    . CARDIAC CATHETERIZATION     11  . CARDIOVERSION    . CARDIOVERSION  04/15/2011   Procedure: CARDIOVERSION;  Surgeon: Loralie Champagne, MD;  Location: [place];  Service: Cardiovascular;  Laterality: N/A;  . CARDIOVERSION N/A 09/11/2014   Procedure: CARDIOVERSION;  Surgeon: Sueanne Margarita, MD;  Location: [facility] ENDOSCOPY;  Service: Cardiovascular;  Laterality: N/A;  . CARDIOVERSION N/A 06/27/2015   Procedure: CARDIOVERSION;  Surgeon: Thayer Headings, MD;  Location: [facility] ENDOSCOPY;  Service: Cardiovascular;  Laterality: N/A;  . CARDIOVERSION N/A 07/04/2015   Procedure: CARDIOVERSION;  Surgeon: Evans Lance, MD;  Location: [place];  Service: Cardiovascular;  Laterality: N/A;  . CARDIOVERSION N/A 04/13/2017   Procedure: CARDIOVERSION;  Surgeon: Sueanne Margarita, MD;  Location: [facility] ENDOSCOPY;  Service: Cardiovascular;  Laterality: N/A;  . COLONOSCOPY    . EP IMPLANTABLE DEVICE N/A 06/16/2015   Procedure: Pacemaker Implant;  Surgeon: Evans Lance, MD;  Location: [place] CV LAB;  Service: Cardiovascular;  Laterality: N/A;  . hemrrhoidectomy    . LEFT AND RIGHT HEART CATHETERIZATION WITH CORONARY ANGIOGRAM Bilateral 02/01/2011   Procedure: LEFT AND RIGHT HEART CATHETERIZATION WITH CORONARY ANGIOGRAM;  Surgeon: Hillary Bow, MD;  Location: [facility] CATH LAB;  Service: Cardiovascular;  Laterality: Bilateral;  . MAZE  03/15/2011   Procedure: MAZE;  Surgeon: Gaye Pollack, MD;  Location: [place];  Service: Open Heart Surgery;  Laterality: N/A;  . PACEMAKER INSERTION   1991   Guidant PPM, most recent Generator Change by Dr Olevia Perches was 08/22/06  . RIGHT/LEFT HEART CATH AND CORONARY ANGIOGRAPHY N/A 07/06/2016   Procedure: Right/Left Heart Cath and Coronary Angiography;  Surgeon: Sherren Mocha, MD;  Location: [place] CV LAB;  Service: Cardiovascular;  Laterality: N/A;  . TEE WITHOUT CARDIOVERSION  04/15/2011   Procedure: TRANSESOPHAGEAL ECHOCARDIOGRAM (TEE);  Surgeon: Loralie Champagne, MD;  Location: [place];  Service: Cardiovascular;  Laterality: N/A;  . TEE WITHOUT CARDIOVERSION N/A 09/11/2014   Procedure: TRANSESOPHAGEAL ECHOCARDIOGRAM (TEE);  Surgeon: Sueanne Margarita, MD;  Location: [facility] ENDOSCOPY;  Service: Cardiovascular;  Laterality: N/A;     reports that he has never smoked. He has never used smokeless tobacco. He reports that he does not drink alcohol and does not use drugs.  Allergies  Allergen Reactions  . Contrast Media [Iodinated Diagnostic Agents] Hives  . Gadolinium Derivatives Hives  . Metrizamide Hives    Family History  Problem Relation Age of Onset  . Heart disease Brother        Twin brother has coronary disease and recent AVR for AS  . Anesthesia problems Neg Hx   . Hypotension Neg Hx   . Malignant hyperthermia Neg Hx   . Pseudochol deficiency Neg Hx      Prior to Admission medications   Medication Sig Start Date End Date Taking? Authorizing Provider  bisacodyl (DULCOLAX) 5 MG EC tablet Take 5 mg by  mouth as needed (constipation).    Yes [provider]  butalbital-acetaminophen-caffeine (FIORICET, ESGIC) 50-325-40 MG per tablet Take 1 tablet by mouth 2 (two) times daily as needed (osteo-arthritis pain).  02/28/13  Yes [provider]  Cholecalciferol (VITAMIN D3) 2000 units capsule Take 1,000 Units by mouth daily.    Yes [provider]  Coenzyme Q10 100 MG capsule Take 100 mg by mouth daily.    Yes [provider]  Diclofenac Sodium 1.5 % SOLN Take 1 application by mouth 2 (two) times daily  as needed (Knee pain).  02/28/13  Yes [provider]  doxazosin (CARDURA) 4 MG tablet Take 4 mg by mouth at bedtime.    Yes [provider]  ELIQUIS 5 MG TABS tablet TAKE 1 TABLET BY MOUTH 2 TIMES DAILY. Patient taking differently: Take 5 mg by mouth 2 (two) times daily.  04/26/19  Yes Evans Lance, MD  ezetimibe (ZETIA) 10 MG tablet Take 1 tablet (10 mg total) by mouth daily. 10/31/18  Yes Evans Lance, MD  HYDROcodone-acetaminophen (NORCO) 10-325 MG tablet TAKE 1 TABLET BY MOUTH TWICE DAILY AS NEEDED FOR MODERATE PAIN 09/21/19  Yes Ofilia Neas, PA-C  LORazepam (ATIVAN) 1 MG tablet Take 1 mg by mouth at bedtime.  01/05/12  Yes Hillary Bow, MD  magnesium oxide (MAG-OX) 400 MG tablet Take 400 mg by mouth daily.   Yes [provider]  metoprolol succinate (TOPROL-XL) 50 MG 24 hr tablet Take 50 mg by mouth in the morning and at bedtime. Take with or immediately following a meal.   Yes [provider]  metoprolol tartrate (LOPRESSOR) 50 MG tablet Take one tablet by mouth as needed for chest pain/palpitations.  May take up to 3 tabs daily. 10/31/18  Yes Evans Lance, MD  nitroGLYCERIN (NITROSTAT) 0.4 MG SL tablet PLACE 1 TABLET UNDER THE TONGUE EVERY 5 MINUTES AS NEEDED FOR CHEST PAIN. Patient taking differently: Place 0.4 mg under the tongue every 5 (five) minutes as needed for chest pain.  05/09/19  Yes Sherren Mocha, MD  OVER THE COUNTER MEDICATION Apply 1 patch topically daily as needed (neck and shoulder pain). Chinese pain patch   Yes [provider]  potassium chloride SA (KLOR-CON) 20 MEQ tablet TAKE 2 TABLETS BY MOUTH DAILY. Patient taking differently: Take 20 mEq by mouth daily.  05/09/19  Yes Sherren Mocha, MD  predniSONE (DELTASONE) 5 MG tablet Take 5 mg by mouth daily with breakfast.   Yes [provider]  rosuvastatin (CRESTOR) 20 MG tablet TAKE 1 TABLET BY MOUTH AT BEDTIME. Patient taking differently: Take 20 mg by mouth  daily.  05/09/19  Yes Sherren Mocha, MD    Physical Exam: Vitals:   10/13/19 1615 10/13/19 1700 10/13/19 1730 10/13/19 1904  BP: (!) 148/107 (!) 148/98 (!) 151/89 129/70  Pulse: 85 85 86 (!) 106  Resp: 16 20 20 15   Temp:      SpO2: 99% 97% 96% 97%  Weight:      Height:        Constitutional: Acute alert and oriented x3, no associated distress.   Skin: no rashes, no lesions, somewhat poor skin turgor noted. Eyes: Pupils are equally reactive to light.  No evidence of scleral icterus or conjunctival pallor.  ENMT: Slightly dry mucous membranes noted.  Posterior pharynx clear of any exudate or lesions.   Neck: normal, supple, no masses, no thyromegaly.  No evidence of jugular venous distension.   Respiratory: clear  to auscultation bilaterally, no wheezing, no crackles. Normal respiratory effort. No accessory muscle use.  Cardiovascular: Cardiac rate with irregularly irregular rhythm, no murmurs / rubs / gallops. No extremity edema. 2+ pedal pulses. No carotid bruits.  Chest:   Nontender without crepitus or deformity.   Back:   Nontender without crepitus or deformity. Abdomen: Abdomen is soft and nontender.  No evidence of intra-abdominal masses.  Positive bowel sounds noted in all quadrants.   Musculoskeletal: No joint deformity upper and lower extremities. Good ROM, no contractures. Normal muscle tone.  Neurologic: CN 2-12 grossly intact. Sensation intact, strength noted to be 5 out of 5 in all 4 extremities.  Patient is following all commands.  Patient is responsive to verbal stimuli.   Psychiatric: Patient presents as a normal mood with appropriate affect.  Patient seems to possess insight as to theircurrent situation.     Labs on Admission: I have personally reviewed following labs and imaging studies -   CBC: Recent Labs  Lab 10/13/19 1158 10/13/19 1737  WBC 11.9*  --   HGB 11.0* 10.6*  HCT 34.7* 34.0*  MCV 91.8  --   PLT 192  --    Basic Metabolic Panel: Recent Labs    Lab 10/13/19 1158  NA 138  K 4.0  CL 105  CO2 23  GLUCOSE 158*  BUN 15  CREATININE 1.09  CALCIUM 8.9   GFR: Estimated Creatinine Clearance: 63 mL/min (by C-G formula based on SCr of 1.09 mg/dL). Liver Function Tests: Recent Labs  Lab 10/13/19 1158  AST 32  ALT 23  ALKPHOS 82  BILITOT 0.8  PROT 7.0  ALBUMIN 3.9   Recent Labs  Lab 10/13/19 1158  LIPASE 21   No results for input(s): AMMONIA in the last 168 hours. Coagulation Profile: Recent Labs  Lab 10/13/19 1158  INR 1.4*   Cardiac Enzymes: No results for input(s): CKTOTAL, CKMB, CKMBINDEX, TROPONINI in the last 168 hours. BNP (last 3 results) Recent Labs    01/31/19 1220  PROBNP 842*   HbA1C: No results for input(s): HGBA1C in the last 72 hours. CBG: No results for input(s): GLUCAP in the last 168 hours. Lipid Profile: No results for input(s): CHOL, HDL, LDLCALC, TRIG, CHOLHDL, LDLDIRECT in the last 72 hours. Thyroid Function Tests: No results for input(s): TSH, T4TOTAL, FREET4, T3FREE, THYROIDAB in the last 72 hours. Anemia Panel: No results for input(s): VITAMINB12, FOLATE, FERRITIN, TIBC, IRON, RETICCTPCT in the last 72 hours. Urine analysis:    Component Value Date/Time   COLORURINE YELLOW 10/13/2019 1524   APPEARANCEUR CLEAR 10/13/2019 1524   LABSPEC 1.014 10/13/2019 1524   PHURINE 6.0 10/13/2019 1524   GLUCOSEU NEGATIVE 10/13/2019 1524   GLUCOSEU NEGATIVE 04/07/2011 1305   HGBUR NEGATIVE 10/13/2019 [place] 10/13/2019 [place] 10/13/2019 1524   PROTEINUR NEGATIVE 10/13/2019 1524   UROBILINOGEN 0.2 04/07/2011 1305   NITRITE NEGATIVE 10/13/2019 1524   LEUKOCYTESUR NEGATIVE 10/13/2019 1524    Radiological Exams on Admission - Personally Reviewed: DG Chest Portable 1 View  Result Date: 10/13/2019 CLINICAL DATA:  Supraventricular tachycardia. EXAM: PORTABLE CHEST 1 VIEW COMPARISON:  Chest radiograph dated 02/15/2018. FINDINGS: The heart size is mildly  enlarged, unchanged. Both lungs are clear. The visualized skeletal structures are unremarkable. Bilateral subclavian cardiac devices are redemonstrated. Median sternotomy wires, an aortic valve prosthesis, and an atrial appendage clip are redemonstrated. IMPRESSION: No active disease. Electronically Signed   By: Zerita Boers M.D.   On: 10/13/2019  13:20    EKG: Personally reviewed.  Rhythm is atrial fibrillation with heart rate of 97 bpm.  Evidence of right bundle branch block with intraventricular conduction delay.  No dynamic ST segment changes appreciated.  Assessment/Plan Principal Problem:   Atrial fibrillation with rapid ventricular response [facility])   Patient presenting after the development of several hours of rapid atrial fibrillation in the home setting.  Patient seems to be acutely aware of when he goes into rapid atrial fibrillation, particularly due to the availability of using his watch EKG future.  Patient states that this was ongoing for approximately 4 hours.  Concerning the etiology of this bout of rapid atrial fibrillation, it is somewhat unclear.  It is possibly secondary to some relative volume depletion due to a week of poor oral intake.  Onset of several episodes of diarrhea this morning could have also precipitated the rapid atrial fibrillation.    Obtaining TSH, serum electrolytes, as well as chest x-ray and urinalysis to ensure there is no evidence of underlying infection.  Patient is Covid negative.  Cycling cardiac enzymes which seem to be markedly elevated and currently 1449.  Considering patient's extended bout of typical sounding chest discomfort for approximately 4 hours earlier today I am concerned patient developed some degree of NSTEMI.  Placing patient back on metoprolol tartrate 50 mg twice daily per Dr. Olin Pia recommendation.  Will order additional intravenous as needed metoprolol for bouts of rapid atrial fibrillation.  Continuing home regimen of Eliquis  for now, will defer whether patient needs need to be transitioned to heparin infusion to cardiology based on upcoming troponins  Admitting patient to stepdown unit for close clinical monitoring.    Acute diarrhea   Patient does mention several small bouts of watery diarrhea this morning, however this was likely laxative induced due to the fact the patient took 2 laxatives and a suppository yesterday evening.  C. difficile testing has already been ordered by another provider, this will be performed if the watery stools continue   Symptoms are likely self-limiting, monitoring for resolution.  Patient also is complaining of a 1 week history of constipation prior to these episodes.  Will obtain abdominal x-ray to evaluate for retained stool.  Active Problems:   NSTEMI (non-ST elevated myocardial infarction) ([place])   Please see assessment and plan above.    Mixed hyperlipidemia   Coronary artery disease involving native coronary artery of native heart   Known multivessel coronary artery disease status post multiple stents placed.  Continue home regimen of statin therapy.  Remainder of assessment and plan as above.    Hypothyroidism   Patient reports history of hypothyroidism related to previous longstanding amiodarone use.  Patient states he is no longer on Synthroid.  Will obtain TSH    Chronic pain syndrome   Substantial chronic pain secondary to advanced osteoarthritis of multiple joints  We will continue home regimen of Norco    Normocytic anemia  Patient indeed does exhibit a mild normocytic anemia on CBC  Stool Hemoccult in the emergency department is indeed positive however hemoglobin and hematocrit are close to baseline.  Will monitor hemoglobin and hematocrit with serial CBCs  No evidence of gross clinical bleeding  Will obtain vitamin B12, folate, iron panel.  Code Status:  Full code Family Communication: Caretaker is at the bedside and has been updated  on plan of care.  Status is: Observation  The patient remains OBS appropriate and will d/c before 2 midnights.  Dispo: The patient is from:  Home              Anticipated d/c is to: Home              Anticipated d/c date is: 2 days              Patient currently is not medically stable to d/c.        Vernelle Emerald MD [facility] Pager [phone]  If 7PM-7AM, please contact night-coverage [email or web address] Use universal [place] password for that web site. If you do not have the password, please call the hospital operator.  10/13/2019, 10:30 PM

## 2019-10-14 ENCOUNTER — Observation Stay (HOSPITAL_COMMUNITY): Payer: Medicare Other

## 2019-10-14 DIAGNOSIS — Z955 Presence of coronary angioplasty implant and graft: Secondary | ICD-10-CM | POA: Diagnosis not present

## 2019-10-14 DIAGNOSIS — Z953 Presence of xenogenic heart valve: Secondary | ICD-10-CM | POA: Diagnosis not present

## 2019-10-14 DIAGNOSIS — D6859 Other primary thrombophilia: Secondary | ICD-10-CM | POA: Diagnosis present

## 2019-10-14 DIAGNOSIS — R778 Other specified abnormalities of plasma proteins: Secondary | ICD-10-CM

## 2019-10-14 DIAGNOSIS — I361 Nonrheumatic tricuspid (valve) insufficiency: Secondary | ICD-10-CM | POA: Diagnosis not present

## 2019-10-14 DIAGNOSIS — E039 Hypothyroidism, unspecified: Secondary | ICD-10-CM | POA: Diagnosis not present

## 2019-10-14 DIAGNOSIS — R0789 Other chest pain: Secondary | ICD-10-CM | POA: Diagnosis present

## 2019-10-14 DIAGNOSIS — I2510 Atherosclerotic heart disease of native coronary artery without angina pectoris: Secondary | ICD-10-CM

## 2019-10-14 DIAGNOSIS — D72829 Elevated white blood cell count, unspecified: Secondary | ICD-10-CM | POA: Diagnosis present

## 2019-10-14 DIAGNOSIS — I484 Atypical atrial flutter: Secondary | ICD-10-CM | POA: Diagnosis present

## 2019-10-14 DIAGNOSIS — E782 Mixed hyperlipidemia: Secondary | ICD-10-CM | POA: Diagnosis present

## 2019-10-14 DIAGNOSIS — I4891 Unspecified atrial fibrillation: Secondary | ICD-10-CM | POA: Diagnosis present

## 2019-10-14 DIAGNOSIS — N400 Enlarged prostate without lower urinary tract symptoms: Secondary | ICD-10-CM | POA: Diagnosis present

## 2019-10-14 DIAGNOSIS — I4819 Other persistent atrial fibrillation: Secondary | ICD-10-CM | POA: Diagnosis present

## 2019-10-14 DIAGNOSIS — I10 Essential (primary) hypertension: Secondary | ICD-10-CM | POA: Diagnosis present

## 2019-10-14 DIAGNOSIS — Z8673 Personal history of transient ischemic attack (TIA), and cerebral infarction without residual deficits: Secondary | ICD-10-CM | POA: Diagnosis not present

## 2019-10-14 DIAGNOSIS — R011 Cardiac murmur, unspecified: Secondary | ICD-10-CM | POA: Diagnosis present

## 2019-10-14 DIAGNOSIS — D472 Monoclonal gammopathy: Secondary | ICD-10-CM | POA: Diagnosis present

## 2019-10-14 DIAGNOSIS — Z950 Presence of cardiac pacemaker: Secondary | ICD-10-CM | POA: Diagnosis not present

## 2019-10-14 DIAGNOSIS — I471 Supraventricular tachycardia: Secondary | ICD-10-CM | POA: Diagnosis present

## 2019-10-14 DIAGNOSIS — I071 Rheumatic tricuspid insufficiency: Secondary | ICD-10-CM | POA: Diagnosis present

## 2019-10-14 DIAGNOSIS — K5900 Constipation, unspecified: Secondary | ICD-10-CM | POA: Diagnosis not present

## 2019-10-14 DIAGNOSIS — Z20822 Contact with and (suspected) exposure to covid-19: Secondary | ICD-10-CM | POA: Diagnosis present

## 2019-10-14 DIAGNOSIS — I2584 Coronary atherosclerosis due to calcified coronary lesion: Secondary | ICD-10-CM | POA: Diagnosis present

## 2019-10-14 DIAGNOSIS — I248 Other forms of acute ischemic heart disease: Secondary | ICD-10-CM | POA: Diagnosis not present

## 2019-10-14 DIAGNOSIS — I340 Nonrheumatic mitral (valve) insufficiency: Secondary | ICD-10-CM | POA: Diagnosis not present

## 2019-10-14 DIAGNOSIS — D638 Anemia in other chronic diseases classified elsewhere: Secondary | ICD-10-CM | POA: Diagnosis present

## 2019-10-14 DIAGNOSIS — I951 Orthostatic hypotension: Secondary | ICD-10-CM | POA: Diagnosis present

## 2019-10-14 DIAGNOSIS — I4589 Other specified conduction disorders: Secondary | ICD-10-CM | POA: Diagnosis present

## 2019-10-14 DIAGNOSIS — I214 Non-ST elevation (NSTEMI) myocardial infarction: Secondary | ICD-10-CM | POA: Diagnosis present

## 2019-10-14 DIAGNOSIS — G894 Chronic pain syndrome: Secondary | ICD-10-CM | POA: Diagnosis present

## 2019-10-14 DIAGNOSIS — R197 Diarrhea, unspecified: Secondary | ICD-10-CM | POA: Diagnosis present

## 2019-10-14 LAB — COMPREHENSIVE METABOLIC PANEL
ALT: 19 U/L (ref 0–44)
AST: 30 U/L (ref 15–41)
Albumin: 3.3 g/dL — ABNORMAL LOW (ref 3.5–5.0)
Alkaline Phosphatase: 81 U/L (ref 38–126)
Anion gap: 8 (ref 5–15)
BUN: 11 mg/dL (ref 8–23)
CO2: 24 mmol/L (ref 22–32)
Calcium: 8.5 mg/dL — ABNORMAL LOW (ref 8.9–10.3)
Chloride: 104 mmol/L (ref 98–111)
Creatinine, Ser: 0.84 mg/dL (ref 0.61–1.24)
GFR calc Af Amer: >60 mL/min (ref >60)
GFR calc non Af Amer: >60 mL/min (ref >60)
Glucose, Bld: 119 mg/dL — ABNORMAL HIGH (ref 70–99)
Potassium: 3.6 mmol/L (ref 3.5–5.1)
Sodium: 136 mmol/L (ref 135–145)
Total Bilirubin: 0.8 mg/dL (ref 0.3–1.2)
Total Protein: 6.1 g/dL — ABNORMAL LOW (ref 6.5–8.1)

## 2019-10-14 LAB — IRON AND TIBC
Iron: 31 ug/dL — ABNORMAL LOW (ref 45–182)
Saturation Ratios: 11 % — ABNORMAL LOW (ref 17.9–39.5)
TIBC: 287 ug/dL (ref 250–450)
UIBC: 256 ug/dL

## 2019-10-14 LAB — CBC WITH DIFFERENTIAL/PLATELET
Abs Immature Granulocytes: 0.03 10*3/uL (ref 0.00–0.07)
Basophils Absolute: 0 10*3/uL (ref 0.0–0.1)
Basophils Relative: 0 %
Eosinophils Absolute: 0.1 10*3/uL (ref 0.0–0.5)
Eosinophils Relative: 1 %
HCT: 31.6 % — ABNORMAL LOW (ref 39.0–52.0)
Hemoglobin: 10.2 g/dL — ABNORMAL LOW (ref 13.0–17.0)
Immature Granulocytes: 0 %
Lymphocytes Relative: 9 %
Lymphs Abs: 0.8 10*3/uL (ref 0.7–4.0)
MCH: 28.7 pg (ref 26.0–34.0)
MCHC: 32.3 g/dL (ref 30.0–36.0)
MCV: 88.8 fL (ref 80.0–100.0)
Monocytes Absolute: 0.7 10*3/uL (ref 0.1–1.0)
Monocytes Relative: 8 %
Neutro Abs: 6.9 10*3/uL (ref 1.7–7.7)
Neutrophils Relative %: 82 %
Platelets: 175 10*3/uL (ref 150–400)
RBC: 3.56 MIL/uL — ABNORMAL LOW (ref 4.22–5.81)
RDW: 14.6 % (ref 11.5–15.5)
WBC: 8.5 10*3/uL (ref 4.0–10.5)
nRBC: 0 % (ref 0.0–0.2)

## 2019-10-14 LAB — APTT: aPTT: 31 seconds (ref 24–36)

## 2019-10-14 LAB — TROPONIN I (HIGH SENSITIVITY): Troponin I (High Sensitivity): 1252 ng/L (ref <18)

## 2019-10-14 LAB — PROTIME-INR
INR: 1.4 — ABNORMAL HIGH (ref 0.8–1.2)
Prothrombin Time: 17 seconds — ABNORMAL HIGH (ref 11.4–15.2)

## 2019-10-14 LAB — VITAMIN B12: Vitamin B-12: 379 pg/mL (ref 180–914)

## 2019-10-14 LAB — MAGNESIUM: Magnesium: 2.3 mg/dL (ref 1.7–2.4)

## 2019-10-14 LAB — FOLATE: Folate: 24.5 ng/mL (ref >5.9)

## 2019-10-14 LAB — TSH: TSH: 2.231 u[IU]/mL (ref 0.350–4.500)

## 2019-10-14 MED ORDER — ASPIRIN 81 MG PO CHEW
81.0000 mg | CHEWABLE_TABLET | ORAL | Status: AC
  Administered 2019-10-15: 81 mg via ORAL
  Filled 2019-10-14: qty 1

## 2019-10-14 MED ORDER — DIPHENHYDRAMINE HCL 50 MG/ML IJ SOLN
50.0000 mg | Freq: Once | INTRAMUSCULAR | Status: AC
  Administered 2019-10-15: 50 mg via INTRAVENOUS
  Filled 2019-10-14: qty 1

## 2019-10-14 MED ORDER — SODIUM CHLORIDE 0.9% FLUSH: 3.0000 mL | INTRAVENOUS | Status: DC | PRN

## 2019-10-14 MED ORDER — DIPHENHYDRAMINE HCL 25 MG PO CAPS: 50.0000 mg | ORAL_CAPSULE | Freq: Once | ORAL | Status: AC

## 2019-10-14 MED ORDER — SENNOSIDES-DOCUSATE SODIUM 8.6-50 MG PO TABS
1.0000 | ORAL_TABLET | Freq: Two times a day (BID) | ORAL | Status: DC
  Administered 2019-10-14 – 2019-10-16 (×3): 1 via ORAL
  Filled 2019-10-14 (×3): qty 1

## 2019-10-14 MED ORDER — HEPARIN (PORCINE) 25000 UT/250ML-% IV SOLN
1500.0000 [IU]/h | INTRAVENOUS | Status: DC
  Administered 2019-10-14: 1500 [IU]/h via INTRAVENOUS
  Filled 2019-10-14: qty 250

## 2019-10-14 MED ORDER — SODIUM CHLORIDE 0.9% FLUSH
3.0000 mL | Freq: Two times a day (BID) | INTRAVENOUS | Status: DC
  Administered 2019-10-14 (×2): 3 mL via INTRAVENOUS

## 2019-10-14 MED ORDER — SODIUM CHLORIDE 0.9% FLUSH
3.0000 mL | Freq: Two times a day (BID) | INTRAVENOUS | Status: DC
  Administered 2019-10-14: 3 mL via INTRAVENOUS

## 2019-10-14 MED ORDER — HYDROCORTISONE (PERIANAL) 2.5 % EX CREA
TOPICAL_CREAM | Freq: Two times a day (BID) | CUTANEOUS | Status: DC
  Administered 2019-10-14: 1 via RECTAL
  Filled 2019-10-14: qty 28.35

## 2019-10-14 MED ORDER — SODIUM CHLORIDE 0.9 % WEIGHT BASED INFUSION
1.0000 mL/kg/h | INTRAVENOUS | Status: DC
  Administered 2019-10-15: 1 mL/kg/h via INTRAVENOUS

## 2019-10-14 MED ORDER — SODIUM CHLORIDE 0.9 % WEIGHT BASED INFUSION
3.0000 mL/kg/h | INTRAVENOUS | Status: DC
  Administered 2019-10-15: 3 mL/kg/h via INTRAVENOUS

## 2019-10-14 MED ORDER — SODIUM CHLORIDE 0.9 % IV SOLN: 250.0000 mL | INTRAVENOUS | Status: DC | PRN

## 2019-10-14 MED ORDER — PREDNISONE 20 MG PO TABS
50.0000 mg | ORAL_TABLET | Freq: Four times a day (QID) | ORAL | Status: DC
  Administered 2019-10-15 (×2): 50 mg via ORAL
  Filled 2019-10-14 (×2): qty 1

## 2019-10-14 MED ORDER — POLYETHYLENE GLYCOL 3350 17 G PO PACK
17.0000 g | PACK | Freq: Every day | ORAL | Status: DC | PRN
  Filled 2019-10-14: qty 1

## 2019-10-14 NOTE — H&P (View-Only) (Signed)
Progress Note  Patient Name: [name], MD Date of Encounter: 10/14/2019  [facility] Cardiologist: Sherren Mocha, MD   Patient Profile     85 y.o. male with permanent atrial tach ( CL320) (anticoagulation with Apixoban  )with normally controlled VR admitted 8/14 for increased HR in the context of acute diarrhea and subacute scalp infection. Has severe baseline orthostasis and IgM gammopathy; ocular myasthenia; AVR/MAze and microvascular angina.    HsTn 14 ( on synopsis but not lab list??) > 120>.833>>1449>>1252  No interval diarrhea    Subjective   Still feels washed out lightheaded with standing   Inpatient Medications    Scheduled Meds:  apixaban  5 mg Oral BID   cholecalciferol  1,000 Units Oral Daily   doxazosin  4 mg Oral QHS   ezetimibe  10 mg Oral Daily   LORazepam  1 mg Oral QHS   magnesium oxide  400 mg Oral Daily   metoprolol tartrate  50 mg Oral BID   predniSONE  5 mg Oral Q breakfast   rosuvastatin  20 mg Oral Daily   Continuous Infusions:  PRN Meds: acetaminophen, butalbital-acetaminophen-caffeine, HYDROcodone-acetaminophen **OR** HYDROcodone-acetaminophen, metoprolol tartrate, nitroGLYCERIN, ondansetron (ZOFRAN) IV   Vital Signs    Vitals:   10/14/19 0217 10/14/19 0342 10/14/19 0831 10/14/19 1100  BP: (!) 157/91 (!) 158/104 (!) 172/103 130/84  Pulse: 87 80 74 68  Resp: 20 20 18 20   Temp: 98.4 F (36.9 C) 98.1 F (36.7 C) 98.2 F (36.8 C) 97.9 F (36.6 C)  TempSrc: Oral Oral Oral Axillary  SpO2: 95% 96% 98% 94%  Weight:      Height:        Intake/Output Summary (Last 24 hours) at 10/14/2019 1119 Last data filed at 10/14/2019 1100 Gross per 24 hour  Intake 240 ml  Output 556 ml  Net -316 ml   Last 3 Weights 10/14/2019 10/13/2019 08/14/2019  Weight (lbs) 228 lb 11.2 oz 222 lb 231 lb  Weight (kg) 103.738 kg 100.699 kg 104.781 kg      Telemetry     atach with VVR* - Personally Reviewed  ECG       Physical  Exam    GEN: No acute distress.   Neck: No JVD Cardiac: iRRR, no murmurs, rubs, or gallops.  Respiratory: Clear to auscultation bilaterally. GI: Soft, nontender, non-distended  MS: No edema; No deformity. Neuro:  Nonfocal  Skin decreased turgor  Psych: Normal affect   Labs    High Sensitivity Troponin:   Recent Labs  Lab 10/13/19 1158 10/13/19 1629 10/13/19 2012 10/14/19 0936  TROPONINIHS 120* 833* 1,449* 1,252*      Chemistry Recent Labs  Lab 10/13/19 1158 10/14/19 0208  NA 138 136  K 4.0 3.6  CL 105 104  CO2 23 24  GLUCOSE 158* 119*  BUN 15 11  CREATININE 1.09 0.84  CALCIUM 8.9 8.5*  PROT 7.0 6.1*  ALBUMIN 3.9 3.3*  AST 32 30  ALT 23 19  ALKPHOS 82 81  BILITOT 0.8 0.8  GFRNONAA >60 >60  GFRAA >60 >60  ANIONGAP 10 8     Hematology Recent Labs  Lab 10/13/19 1158 10/13/19 1737 10/14/19 0208  WBC 11.9*  --  8.5  RBC 3.78*  --  3.56*  HGB 11.0* 10.6* 10.2*  HCT 34.7* 34.0* 31.6*  MCV 91.8  --  88.8  MCH 29.1  --  28.7  MCHC 31.7  --  32.3  RDW 14.4  --  14.6  PLT 192  --  175    BNPNo results for input(s): BNP, PROBNP in the last 168 hours.   DDimer No results for input(s): DDIMER in the last 168 hours.   Radiology    DG Abd 1 View  Result Date: 10/14/2019 CLINICAL DATA:  Constipation. EXAM: ABDOMEN - 1 VIEW COMPARISON:  CT 09/12/2018 FINDINGS: No bowel dilatation to suggest obstruction. There is moderate stool in the right colon, with small volume of stool throughout the remainder of the colon. No abnormal rectal distention. Pelvic calcifications consistent phleboliths. There also vascular calcifications. No acute osseous abnormalities are seen. IMPRESSION: Moderate stool in the right colon, small volume of stool distally. No abnormal rectal distention. Normal bowel gas pattern without obstruction. Electronically Signed   By: Keith Rake M.D.   On: 10/14/2019 01:30   DG Chest Portable 1 View  Result Date: 10/13/2019 CLINICAL DATA:   Supraventricular tachycardia. EXAM: PORTABLE CHEST 1 VIEW COMPARISON:  Chest radiograph dated 02/15/2018. FINDINGS: The heart size is mildly enlarged, unchanged. Both lungs are clear. The visualized skeletal structures are unremarkable. Bilateral subclavian cardiac devices are redemonstrated. Median sternotomy wires, an aortic valve prosthesis, and an atrial appendage clip are redemonstrated. IMPRESSION: No active disease. Electronically Signed   By: Zerita Boers M.D.   On: 10/13/2019 13:20    Cardiac Studies       Assessment & Plan    Atrial tachycardia 1: 1 conduction  Chest pain associated with the above  Diarrheal illness-acute  Resolving infection following scalp Mohs procedure  Ocular myasthenia  Hypertension-systolic  Orthostatic hypotension  Pacemaker-[employer]  Anemia-mild  Coronary artery disease/microvascular angina  Status post AVR/maze  IgM gammopathy chronic anticoagulation  Elevated Troponin   Will check orthostatics  Continue fluids -- by mouth  His steroid dose has been low and relatively short duration ( 1 yr) so doubt adrenally insufficient so will not give stress steroids at present ( x for cath--See Below)     No known obstructive CAD, so suspect elevated Tn related to the demand ischemia inferred from chest pain with tachycardia.  Will defer to Dr [facility] as to further workup put will write orders for cath in am  Will need premeidcation        For questions or updates, please contact [facility] Please consult [email or web address] for contact info under        Signed, Virl Axe, MD  10/14/2019, 11:19 AM

## 2019-10-14 NOTE — Progress Notes (Signed)
   10/14/19 0000  Assess: MEWS Score  Temp 98.5 F (36.9 C)  BP (!) 158/104  Pulse Rate (!) 137  Resp 20  SpO2 96 %  O2 Device Room Air  Assess: MEWS Score  MEWS Temp 0  MEWS Systolic 0  MEWS Pulse 3  MEWS RR 0  MEWS LOC 0  MEWS Score 3  MEWS Score Color Yellow  Assess: if the MEWS score is Yellow or Red  Were vital signs taken at a resting state? Yes  Focused Assessment No change from prior assessment  Early Detection of Sepsis Score *See Row Information* Low  MEWS guidelines implemented *See Row Information* Yes  Treat  MEWS Interventions Administered scheduled meds/treatments  Take Vital Signs  Increase Vital Sign Frequency  Yellow: Q 2hr X 2 then Q 4hr X 2, if remains yellow, continue Q 4hrs  Escalate  MEWS: Escalate Yellow: discuss with charge nurse/RN and consider discussing with provider and RRT  Notify: Charge Nurse/RN  Name of Charge Nurse/RN Notified  Sharee Pimple RN  Date Charge Nurse/RN Notified 10/14/19  Time Charge Nurse/RN Notified 0000  Document  Patient Outcome Stabilized after interventions (HR 84)  Progress note created (see row info) Yes

## 2019-10-14 NOTE — Progress Notes (Signed)
Progress Note  Patient Name: Kevin Buff, MD Date of Encounter: 10/14/2019  [facility] Cardiologist: Sherren Mocha, MD   Patient Profile     85 y.o. male with permanent atrial tach ( CL320) (anticoagulation with Apixoban  )with normally controlled VR admitted 8/14 for increased HR in the context of acute diarrhea and subacute scalp infection. Has severe baseline orthostasis and IgM gammopathy; ocular myasthenia; AVR/MAze and microvascular angina.    HsTn 14 ( on synopsis but not lab list??) > 120>.833>>1449>>1252  No interval diarrhea    Subjective   Still feels washed out lightheaded with standing   Inpatient Medications    Scheduled Meds: . apixaban  5 mg Oral BID  . cholecalciferol  1,000 Units Oral Daily  . doxazosin  4 mg Oral QHS  . ezetimibe  10 mg Oral Daily  . LORazepam  1 mg Oral QHS  . magnesium oxide  400 mg Oral Daily  . metoprolol tartrate  50 mg Oral BID  . predniSONE  5 mg Oral Q breakfast  . rosuvastatin  20 mg Oral Daily   Continuous Infusions:  PRN Meds: acetaminophen, butalbital-acetaminophen-caffeine, HYDROcodone-acetaminophen **OR** HYDROcodone-acetaminophen, metoprolol tartrate, nitroGLYCERIN, ondansetron (ZOFRAN) IV   Vital Signs    Vitals:   10/14/19 0217 10/14/19 0342 10/14/19 0831 10/14/19 1100  BP: (!) 157/91 (!) 158/104 (!) 172/103 130/84  Pulse: 87 80 74 68  Resp: 20 20 18 20   Temp: 98.4 F (36.9 C) 98.1 F (36.7 C) 98.2 F (36.8 C) 97.9 F (36.6 C)  TempSrc: Oral Oral Oral Axillary  SpO2: 95% 96% 98% 94%  Weight:      Height:        Intake/Output Summary (Last 24 hours) at 10/14/2019 1119 Last data filed at 10/14/2019 1100 Gross per 24 hour  Intake 240 ml  Output 556 ml  Net -316 ml   Last 3 Weights 10/14/2019 10/13/2019 08/14/2019  Weight (lbs) 228 lb 11.2 oz 222 lb 231 lb  Weight (kg) 103.738 kg 100.699 kg 104.781 kg      Telemetry     atach with VVR* - Personally Reviewed  ECG       Physical  Exam    GEN: No acute distress.   Neck: No JVD Cardiac: iRRR, no murmurs, rubs, or gallops.  Respiratory: Clear to auscultation bilaterally. GI: Soft, nontender, non-distended  MS: No edema; No deformity. Neuro:  Nonfocal  Skin decreased turgor  Psych: Normal affect   Labs    High Sensitivity Troponin:   Recent Labs  Lab 10/13/19 1158 10/13/19 1629 10/13/19 2012 10/14/19 0936  TROPONINIHS 120* 833* 1,449* 1,252*      Chemistry Recent Labs  Lab 10/13/19 1158 10/14/19 0208  NA 138 136  K 4.0 3.6  CL 105 104  CO2 23 24  GLUCOSE 158* 119*  BUN 15 11  CREATININE 1.09 0.84  CALCIUM 8.9 8.5*  PROT 7.0 6.1*  ALBUMIN 3.9 3.3*  AST 32 30  ALT 23 19  ALKPHOS 82 81  BILITOT 0.8 0.8  GFRNONAA >60 >60  GFRAA >60 >60  ANIONGAP 10 8     Hematology Recent Labs  Lab 10/13/19 1158 10/13/19 1737 10/14/19 0208  WBC 11.9*  --  8.5  RBC 3.78*  --  3.56*  HGB 11.0* 10.6* 10.2*  HCT 34.7* 34.0* 31.6*  MCV 91.8  --  88.8  MCH 29.1  --  28.7  MCHC 31.7  --  32.3  RDW 14.4  --  14.6  PLT 192  --  175    BNPNo results for input(s): BNP, PROBNP in the last 168 hours.   DDimer No results for input(s): DDIMER in the last 168 hours.   Radiology    DG Abd 1 View  Result Date: 10/14/2019 CLINICAL DATA:  Constipation. EXAM: ABDOMEN - 1 VIEW COMPARISON:  CT 09/12/2018 FINDINGS: No bowel dilatation to suggest obstruction. There is moderate stool in the right colon, with small volume of stool throughout the remainder of the colon. No abnormal rectal distention. Pelvic calcifications consistent phleboliths. There also vascular calcifications. No acute osseous abnormalities are seen. IMPRESSION: Moderate stool in the right colon, small volume of stool distally. No abnormal rectal distention. Normal bowel gas pattern without obstruction. Electronically Signed   By: Keith Rake M.D.   On: 10/14/2019 01:30   DG Chest Portable 1 View  Result Date: 10/13/2019 CLINICAL DATA:   Supraventricular tachycardia. EXAM: PORTABLE CHEST 1 VIEW COMPARISON:  Chest radiograph dated 02/15/2018. FINDINGS: The heart size is mildly enlarged, unchanged. Both lungs are clear. The visualized skeletal structures are unremarkable. Bilateral subclavian cardiac devices are redemonstrated. Median sternotomy wires, an aortic valve prosthesis, and an atrial appendage clip are redemonstrated. IMPRESSION: No active disease. Electronically Signed   By: Zerita Boers M.D.   On: 10/13/2019 13:20    Cardiac Studies       Assessment & Plan    Atrial tachycardia 1: 1 conduction  Chest pain associated with the above  Diarrheal illness-acute  Resolving infection following scalp Mohs procedure  Ocular myasthenia  Hypertension-systolic  Orthostatic hypotension  Pacemaker-[employer]  Anemia-mild  Coronary artery disease/microvascular angina  Status post AVR/maze  IgM gammopathy chronic anticoagulation  Elevated Troponin   Will check orthostatics  Continue fluids -- by mouth  His steroid dose has been low and relatively short duration ( 1 yr) so doubt adrenally insufficient so will not give stress steroids at present ( x for cath--See Below)     No known obstructive CAD, so suspect elevated Tn related to the demand ischemia inferred from chest pain with tachycardia.  Will defer to Dr [facility] as to further workup put will write orders for cath in am  Will need premeidcation        For questions or updates, please contact [place] Please consult [email or web address] for contact info under        Signed, Virl Axe, MD  10/14/2019, 11:19 AM

## 2019-10-14 NOTE — Progress Notes (Signed)
Patient ID: Kevin Buff, MD, male   DOB: 15-Mar-1934, 85 y.o.   MRN: [id number]  PROGRESS NOTE    Kevin Buff, MD  FYB:[id number] DOB: September 13, 1934 DOA: 10/13/2019 PCP: [name], MD   Brief Narrative:  84 year old male with history of persistent atrial fibrillation status post pacemaker placement, aortic stenosis status post aortic valve replacement in 2012, CAD status post PCI and stent placement, last cath in 2018, hyperlipidemia, previous unspecified CVA, hypothyroidism, MGUS, chronic pain syndrome, orthostatic hypotension presented with chest discomfort, lightheadedness and weakness.  She was found to have A. fib with RVR with heart rates reaching up to 180 by EMS requiring several rounds of adenosine followed by IV diltiazem in the ED.  Cardiology was consulted.  Assessment & Plan:   A. fib with RVR in a patient with history of persistent A. Fib -Initially required IV adenosine followed by IV Cardizem.  Cardiology following.  Currently rate controlled.  Continue oral metoprolol.  Continue Eliquis.  Follow further cardiology recommendations.  Diarrhea -Most likely from recent laxative use.  C. difficile test pending although does not sound that patient has serial diarrhea.  Positive troponins in a patient with history of CAD status post PCI and stent placement -Unclear if this is demand ischemia versus non-STEMI.  Cardiology following.  Continue metoprolol and statin  Leukocytosis -Mild.  Resolved  Chronic normocytic anemia/anemia of chronic disease -Questionable cause.  Hemoglobin stable.  Hyperlipidemia -continue ezetimibe and statin  Chronic pain syndrome -Continue home regimen.  BPH  -continue doxazosin  Intermittent orthostatic hypotension History of hypertension -Blood pressure intermittently elevated.  Intermittently also becomes orthostatic.  Currently on low-dose prednisone.      DVT prophylaxis: Apixaban Code Status: Full Family Communication:  Caretaker at bedside Disposition Plan: Status is: Observation  Will switch to inpatient.  Not ready for discharge today, pending further cardiology recommendations. Dispo: The patient is from: Home              Anticipated d/c is to: Home              Anticipated d/c date is: 1 day              Patient currently is not medically stable to d/c.   Consultants: Cardiology  Procedures: None  Antimicrobials: None   Subjective: Patient seen and examined at bedside.  Feels almost the same.  Diarrhea improving.  Denies any palpitations.  No overnight fever or vomiting reported.  Objective: Vitals:   10/14/19 0217 10/14/19 0342 10/14/19 0831 10/14/19 1100  BP: (!) 157/91 (!) 158/104 (!) 172/103 130/84  Pulse: 87 80 74 68  Resp: 20 20 18 20   Temp: 98.4 F (36.9 C) 98.1 F (36.7 C) 98.2 F (36.8 C) 97.9 F (36.6 C)  TempSrc: Oral Oral Oral Axillary  SpO2: 95% 96% 98% 94%  Weight:      Height:        Intake/Output Summary (Last 24 hours) at 10/14/2019 1156 Last data filed at 10/14/2019 1100 Gross per 24 hour  Intake 240 ml  Output 556 ml  Net -316 ml   Filed Weights   10/13/19 1151 10/14/19 0000  Weight: 100.7 kg 103.7 kg    Examination:  General exam: Appears calm and comfortable  Respiratory system: Bilateral decreased breath sounds at bases Cardiovascular system: S1 & S2 heard, Rate controlled Gastrointestinal system: Abdomen is nondistended, soft and nontender. Normal bowel sounds heard. Extremities: No cyanosis, clubbing, edema  Central nervous system: Alert and oriented. No  focal neurological deficits. Moving extremities Skin: No rashes, lesions or ulcers Psychiatry: Judgement and insight appear normal. Mood & affect appropriate.     Data Reviewed: I have personally reviewed following labs and imaging studies  CBC: Recent Labs  Lab 10/13/19 1158 10/13/19 1737 10/14/19 0208  WBC 11.9*  --  8.5  NEUTROABS  --   --  6.9  HGB 11.0* 10.6* 10.2*  HCT 34.7*  34.0* 31.6*  MCV 91.8  --  88.8  PLT 192  --  063   Basic Metabolic Panel: Recent Labs  Lab 10/13/19 1158 10/13/19 2012 10/14/19 0208  NA 138  --  136  K 4.0  --  3.6  CL 105  --  104  CO2 23  --  24  GLUCOSE 158*  --  119*  BUN 15  --  11  CREATININE 1.09  --  0.84  CALCIUM 8.9  --  8.5*  MG  --  2.3 2.3   GFR: Estimated Creatinine Clearance: 84.3 mL/min (by C-G formula based on SCr of 0.84 mg/dL). Liver Function Tests: Recent Labs  Lab 10/13/19 1158 10/14/19 0208  AST 32 30  ALT 23 19  ALKPHOS 82 81  BILITOT 0.8 0.8  PROT 7.0 6.1*  ALBUMIN 3.9 3.3*   Recent Labs  Lab 10/13/19 1158  LIPASE 21   No results for input(s): AMMONIA in the last 168 hours. Coagulation Profile: Recent Labs  Lab 10/13/19 1158 10/14/19 0208  INR 1.4* 1.4*   Cardiac Enzymes: No results for input(s): CKTOTAL, CKMB, CKMBINDEX, TROPONINI in the last 168 hours. BNP (last 3 results) Recent Labs    01/31/19 1220  PROBNP 842*   HbA1C: No results for input(s): HGBA1C in the last 72 hours. CBG: No results for input(s): GLUCAP in the last 168 hours. Lipid Profile: No results for input(s): CHOL, HDL, LDLCALC, TRIG, CHOLHDL, LDLDIRECT in the last 72 hours. Thyroid Function Tests: Recent Labs    10/14/19 0208  TSH 2.231   Anemia Panel: Recent Labs    10/14/19 0208  VITAMINB12 379  FOLATE 24.5  TIBC 287  IRON 31*   Sepsis Labs: No results for input(s): PROCALCITON, LATICACIDVEN in the last 168 hours.  Recent Results (from the past 240 hour(s))  SARS Coronavirus 2 by RT PCR (hospital order, performed in [facility] hospital lab) Nasopharyngeal Nasopharyngeal Swab     Status: None   Collection Time: 10/13/19  3:59 PM   Specimen: Nasopharyngeal Swab  Result Value Ref Range Status   SARS Coronavirus 2 NEGATIVE NEGATIVE Final    Comment: (NOTE) SARS-CoV-2 target nucleic acids are NOT DETECTED.  The SARS-CoV-2 RNA is generally detectable in upper and lower respiratory  specimens during the acute phase of infection. The lowest concentration of SARS-CoV-2 viral copies this assay can detect is 250 copies / mL. A negative result does not preclude SARS-CoV-2 infection and should not be used as the sole basis for treatment or other patient management decisions.  A negative result may occur with improper specimen collection / handling, submission of specimen other than nasopharyngeal swab, presence of viral mutation(s) within the areas targeted by this assay, and inadequate number of viral copies (<250 copies / mL). A negative result must be combined with clinical observations, patient history, and epidemiological information.  Fact Sheet for Patients:   StrictlyIdeas.no  Fact Sheet for Healthcare Providers: BankingDealers.co.za  This test is not yet approved or  cleared by the [place] FDA and has been authorized for detection  and/or diagnosis of SARS-CoV-2 by FDA under an Emergency Use Authorization (EUA).  This EUA will remain in effect (meaning this test can be used) for the duration of the COVID-19 declaration under Section 564(b)(1) of the Act, 21 U.S.C. section 360bbb-3(b)(1), unless the authorization is terminated or revoked sooner.  Performed at [facility], [place] [address], [place], [place] [address]          Radiology Studies: DG Abd 1 View  Result Date: 10/14/2019 CLINICAL DATA:  Constipation. EXAM: ABDOMEN - 1 VIEW COMPARISON:  CT 09/12/2018 FINDINGS: No bowel dilatation to suggest obstruction. There is moderate stool in the right colon, with small volume of stool throughout the remainder of the colon. No abnormal rectal distention. Pelvic calcifications consistent phleboliths. There also vascular calcifications. No acute osseous abnormalities are seen. IMPRESSION: Moderate stool in the right colon, small volume of stool distally. No abnormal rectal distention. Normal bowel gas  pattern without obstruction. Electronically Signed   By: Keith Rake M.D.   On: 10/14/2019 01:30   DG Chest Portable 1 View  Result Date: 10/13/2019 CLINICAL DATA:  Supraventricular tachycardia. EXAM: PORTABLE CHEST 1 VIEW COMPARISON:  Chest radiograph dated 02/15/2018. FINDINGS: The heart size is mildly enlarged, unchanged. Both lungs are clear. The visualized skeletal structures are unremarkable. Bilateral subclavian cardiac devices are redemonstrated. Median sternotomy wires, an aortic valve prosthesis, and an atrial appendage clip are redemonstrated. IMPRESSION: No active disease. Electronically Signed   By: Zerita Boers M.D.   On: 10/13/2019 13:20        Scheduled Meds: . apixaban  5 mg Oral BID  . cholecalciferol  1,000 Units Oral Daily  . doxazosin  4 mg Oral QHS  . ezetimibe  10 mg Oral Daily  . LORazepam  1 mg Oral QHS  . magnesium oxide  400 mg Oral Daily  . metoprolol tartrate  50 mg Oral BID  . predniSONE  5 mg Oral Q breakfast  . rosuvastatin  20 mg Oral Daily   Continuous Infusions:        Aline August, MD [facility] 10/14/2019, 11:56 AM

## 2019-10-14 NOTE — Progress Notes (Signed)
ANTICOAGULATION CONSULT NOTE - Initial Consult  Pharmacy Consult for heparin Indication: atrial fibrillation  Allergies  Allergen Reactions  . Contrast Media [Iodinated Diagnostic Agents] Hives  . Gadolinium Derivatives Hives  . Metrizamide Hives    Patient Measurements: Height: 6\' 1"  (185.4 cm) Weight: 103.7 kg (228 lb 11.2 oz) IBW/kg (Calculated) : 79.9 Heparin Dosing Weight: 101 kg  Vital Signs: Temp: 97.9 F (36.6 C) (08/15 1100) Temp Source: Axillary (08/15 1100) BP: 130/84 (08/15 1100) Pulse Rate: 68 (08/15 1100)  Labs: Recent Labs    10/13/19 1158 10/13/19 1158 10/13/19 1629 10/13/19 1737 10/13/19 2012 10/14/19 0208 10/14/19 0936  HGB 11.0*   < >  --  10.6*  --  10.2*  --   HCT 34.7*  --   --  34.0*  --  31.6*  --   PLT 192  --   --   --   --  175  --   APTT  --   --   --   --   --  31  --   LABPROT 16.5*  --   --   --   --  17.0*  --   INR 1.4*  --   --   --   --  1.4*  --   CREATININE 1.09  --   --   --   --  0.84  --   TROPONINIHS 120*   < > 833*  --  1,449*  --  1,252*   < > = values in this interval not displayed.    Estimated Creatinine Clearance: 84.3 mL/min (by C-G formula based on SCr of 0.84 mg/dL).   Medical History: Past Medical History:  Diagnosis Date  . Aortic stenosis    moderate aortic stenosis  . Arthritis   . Benign prostatic hypertrophy   . 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  . Coronary artery disease    status post multiple prior percutaneous coronary interventions, microvascular angina per Dr Olevia Perches  . Diverticulitis sigmoid colon recurrent   . Heart murmur   . Hypercoagulable state ([place])    chronically anticoagulated with coumadin  . Hyperlipidemia   . Hyperthyroidism   . Hypothyroidism    Dr. Elyse Hsu  . MGUS (monoclonal gammopathy of unknown significance)  02/17/2013  . Paroxysmal atrial fibrillation ([place])    DR. Lia Foyer,   . Prediabetes 09/21/2017  . Stroke [facility])    1990    Medications:  Scheduled:  . cholecalciferol  1,000 Units Oral Daily  . doxazosin  4 mg Oral QHS  . ezetimibe  10 mg Oral Daily  . LORazepam  1 mg Oral QHS  . magnesium oxide  400 mg Oral Daily  . metoprolol tartrate  50 mg Oral BID  . predniSONE  5 mg Oral Q breakfast  . rosuvastatin  20 mg Oral Daily  . sodium chloride flush  3 mL Intravenous Q12H  . sodium chloride flush  3 mL Intravenous Q12H   PRN: acetaminophen, butalbital-acetaminophen-caffeine, HYDROcodone-acetaminophen **OR** HYDROcodone-acetaminophen, metoprolol tartrate, nitroGLYCERIN, ondansetron (ZOFRAN) IV   Assessment: 85 yo male presents with chest discomfort, lightheadedness and weakness. The patient has a history of atrial fibrillation in which they take apixaban for PTA. The patient's last dose of apixaban was on 8/15 at 0925. The plan is for the patient to go to cath lab on 8/16 in the morning.  Apixaban can elevate the heparin level, therefore, will obtain aPTT levels and adjust therapy accordingly. Once heparin levels and aPTT levels correlate, then can discontinue the aPTT level and adjust therapy with only heparin levels. There are no signs or symptoms of bleeding documented and CBC is WNL. Due to the patient's last dose of apixaban being on 8/15 at 0925, will start heparin drip on 8/15 at 2130 and continue to follow up on cath plans.   Goal of Therapy:  Heparin level 0.3-0.7 units/ml aPTT 66-102 seconds Monitor platelets by anticoagulation protocol: Yes   Plan:  Heparin IV 1500 units/hr, no bolus Check 8-hr heparin level and aPTT Monitor daily heparin level, aPTT and CBC Monitor for signs and symptoms of bleeding  Shauna Hugh, PharmD, [place]  PGY-1 Pharmacy Resident 10/14/2019 2:48 PM  Please check [email or web address] for unit-specific pharmacy phone numbers.

## 2019-10-14 NOTE — Progress Notes (Signed)
EP consult note reviewed.  I personally interviewed and examined patient.  Called due to ongoing CP with intermittent with elevated HRs.  HR mainly in the 80's at rest but with any movement increases to the 130's and gets CP.  hsTrop elevated at 120 and has continued to trend upward now at 833.  Will continue to cycle hsTrop until it peaks.  He is anticoagulated on Eliquis.  TRH plans to admit patient.  Suspect his trop elevation is related to prolonged SVT earlier prior to admission.

## 2019-10-15 ENCOUNTER — Encounter (HOSPITAL_COMMUNITY): Payer: Self-pay | Admitting: Interventional Cardiology

## 2019-10-15 ENCOUNTER — Other Ambulatory Visit (HOSPITAL_COMMUNITY): Payer: Medicare Other

## 2019-10-15 ENCOUNTER — Encounter (HOSPITAL_COMMUNITY)
Admission: EM | Disposition: A | Discharge status: Home / Self Care | Payer: Self-pay | Source: Home / Self Care | Attending: Internal Medicine

## 2019-10-15 ENCOUNTER — Inpatient Hospital Stay (HOSPITAL_COMMUNITY): Payer: Medicare Other

## 2019-10-15 DIAGNOSIS — I2510 Atherosclerotic heart disease of native coronary artery without angina pectoris: Secondary | ICD-10-CM | POA: Diagnosis not present

## 2019-10-15 DIAGNOSIS — I340 Nonrheumatic mitral (valve) insufficiency: Secondary | ICD-10-CM

## 2019-10-15 DIAGNOSIS — I4891 Unspecified atrial fibrillation: Secondary | ICD-10-CM | POA: Diagnosis not present

## 2019-10-15 DIAGNOSIS — I361 Nonrheumatic tricuspid (valve) insufficiency: Secondary | ICD-10-CM | POA: Diagnosis not present

## 2019-10-15 DIAGNOSIS — I214 Non-ST elevation (NSTEMI) myocardial infarction: Secondary | ICD-10-CM | POA: Diagnosis not present

## 2019-10-15 DIAGNOSIS — G894 Chronic pain syndrome: Secondary | ICD-10-CM | POA: Diagnosis not present

## 2019-10-15 DIAGNOSIS — I248 Other forms of acute ischemic heart disease: Secondary | ICD-10-CM

## 2019-10-15 HISTORY — PX: LEFT HEART CATH AND CORONARY ANGIOGRAPHY: CATH118249

## 2019-10-15 HISTORY — PX: CORONARY STENT INTERVENTION: CATH118234

## 2019-10-15 LAB — POCT ACTIVATED CLOTTING TIME
Activated Clotting Time: 279 seconds
Activated Clotting Time: 323 seconds

## 2019-10-15 LAB — CBC WITH DIFFERENTIAL/PLATELET
Abs Immature Granulocytes: 0.04 10*3/uL (ref 0.00–0.07)
Basophils Absolute: 0 10*3/uL (ref 0.0–0.1)
Basophils Relative: 0 %
Eosinophils Absolute: 0 10*3/uL (ref 0.0–0.5)
Eosinophils Relative: 1 %
HCT: 35.5 % — ABNORMAL LOW (ref 39.0–52.0)
Hemoglobin: 11.2 g/dL — ABNORMAL LOW (ref 13.0–17.0)
Immature Granulocytes: 1 %
Lymphocytes Relative: 6 %
Lymphs Abs: 0.5 10*3/uL — ABNORMAL LOW (ref 0.7–4.0)
MCH: 28.2 pg (ref 26.0–34.0)
MCHC: 31.5 g/dL (ref 30.0–36.0)
MCV: 89.4 fL (ref 80.0–100.0)
Monocytes Absolute: 0.1 10*3/uL (ref 0.1–1.0)
Monocytes Relative: 2 %
Neutro Abs: 6.6 10*3/uL (ref 1.7–7.7)
Neutrophils Relative %: 90 %
Platelets: 175 10*3/uL (ref 150–400)
RBC: 3.97 MIL/uL — ABNORMAL LOW (ref 4.22–5.81)
RDW: 14.4 % (ref 11.5–15.5)
WBC: 7.3 10*3/uL (ref 4.0–10.5)
nRBC: 0 % (ref 0.0–0.2)

## 2019-10-15 LAB — BASIC METABOLIC PANEL
Anion gap: 8 (ref 5–15)
BUN: 6 mg/dL — ABNORMAL LOW (ref 8–23)
CO2: 24 mmol/L (ref 22–32)
Calcium: 8.9 mg/dL (ref 8.9–10.3)
Chloride: 107 mmol/L (ref 98–111)
Creatinine, Ser: 0.79 mg/dL (ref 0.61–1.24)
GFR calc Af Amer: >60 mL/min (ref >60)
GFR calc non Af Amer: >60 mL/min (ref >60)
Glucose, Bld: 161 mg/dL — ABNORMAL HIGH (ref 70–99)
Potassium: 3.9 mmol/L (ref 3.5–5.1)
Sodium: 139 mmol/L (ref 135–145)

## 2019-10-15 LAB — ECHOCARDIOGRAM COMPLETE
AR max vel: 1.53 cm2
AV Area VTI: 1.42 cm2
AV Area mean vel: 1.42 cm2
AV Mean grad: 7 mmHg
AV Peak grad: 12.4 mmHg
Ao pk vel: 1.76 m/s
Height: 73 in
S' Lateral: 4.2 cm
Weight: 3534.41 oz

## 2019-10-15 LAB — KAPPA/LAMBDA LIGHT CHAINS
Kappa free light chain: 21.7 mg/L — ABNORMAL HIGH (ref 3.3–19.4)
Kappa, lambda light chain ratio: 0.76 (ref 0.26–1.65)
Lambda free light chains: 28.5 mg/L — ABNORMAL HIGH (ref 5.7–26.3)

## 2019-10-15 LAB — HEPARIN LEVEL (UNFRACTIONATED): Heparin Unfractionated: 1.06 IU/mL — ABNORMAL HIGH (ref 0.30–0.70)

## 2019-10-15 LAB — APTT: aPTT: 108 seconds — ABNORMAL HIGH (ref 24–36)

## 2019-10-15 LAB — MAGNESIUM: Magnesium: 2.2 mg/dL (ref 1.7–2.4)

## 2019-10-15 SURGERY — LEFT HEART CATH AND CORONARY ANGIOGRAPHY
Anesthesia: LOCAL

## 2019-10-15 MED ORDER — LABETALOL HCL 5 MG/ML IV SOLN: 10.0000 mg | INTRAVENOUS | Status: AC | PRN

## 2019-10-15 MED ORDER — ONDANSETRON HCL 4 MG/2ML IJ SOLN: 4.0000 mg | Freq: Four times a day (QID) | INTRAMUSCULAR | Status: DC | PRN

## 2019-10-15 MED ORDER — SODIUM CHLORIDE 0.9 % IV SOLN: INTRAVENOUS | Status: AC

## 2019-10-15 MED ORDER — MIDAZOLAM HCL 2 MG/2ML IJ SOLN
INTRAMUSCULAR | Status: AC
  Filled 2019-10-15: qty 2

## 2019-10-15 MED ORDER — FENTANYL CITRATE (PF) 100 MCG/2ML IJ SOLN
INTRAMUSCULAR | Status: DC | PRN
  Administered 2019-10-15 (×2): 25 ug via INTRAVENOUS

## 2019-10-15 MED ORDER — HEPARIN (PORCINE) IN NACL 1000-0.9 UT/500ML-% IV SOLN
INTRAVENOUS | Status: DC | PRN
  Administered 2019-10-15: 500 mL

## 2019-10-15 MED ORDER — SODIUM CHLORIDE 0.9% FLUSH
3.0000 mL | Freq: Two times a day (BID) | INTRAVENOUS | Status: DC
  Administered 2019-10-16: 3 mL via INTRAVENOUS

## 2019-10-15 MED ORDER — CLOPIDOGREL BISULFATE 300 MG PO TABS
ORAL_TABLET | ORAL | Status: AC
  Filled 2019-10-15: qty 2

## 2019-10-15 MED ORDER — FENTANYL CITRATE (PF) 100 MCG/2ML IJ SOLN
INTRAMUSCULAR | Status: AC
  Filled 2019-10-15: qty 2

## 2019-10-15 MED ORDER — HEPARIN (PORCINE) IN NACL 1000-0.9 UT/500ML-% IV SOLN
INTRAVENOUS | Status: AC
  Filled 2019-10-15: qty 1000

## 2019-10-15 MED ORDER — MIDAZOLAM HCL 2 MG/2ML IJ SOLN
INTRAMUSCULAR | Status: DC | PRN
  Administered 2019-10-15 (×2): 1 mg via INTRAVENOUS

## 2019-10-15 MED ORDER — NITROGLYCERIN 1 MG/10 ML FOR IR/CATH LAB
INTRA_ARTERIAL | Status: AC
  Filled 2019-10-15: qty 10

## 2019-10-15 MED ORDER — LIDOCAINE HCL (PF) 1 % IJ SOLN
INTRAMUSCULAR | Status: DC | PRN
  Administered 2019-10-15: 2 mL

## 2019-10-15 MED ORDER — SODIUM CHLORIDE 0.9% FLUSH
3.0000 mL | INTRAVENOUS | Status: DC | PRN
  Administered 2019-10-16: 3 mL via INTRAVENOUS

## 2019-10-15 MED ORDER — APIXABAN 5 MG PO TABS
5.0000 mg | ORAL_TABLET | Freq: Two times a day (BID) | ORAL | Status: DC
  Administered 2019-10-15 – 2019-10-16 (×2): 5 mg via ORAL
  Filled 2019-10-15 (×2): qty 1

## 2019-10-15 MED ORDER — METOPROLOL TARTRATE 50 MG PO TABS
50.0000 mg | ORAL_TABLET | Freq: Once | ORAL | Status: AC
  Administered 2019-10-15: 50 mg via ORAL
  Filled 2019-10-15: qty 1

## 2019-10-15 MED ORDER — SODIUM CHLORIDE 0.9 % IV SOLN: 250.0000 mL | INTRAVENOUS | Status: DC | PRN

## 2019-10-15 MED ORDER — HEPARIN SODIUM (PORCINE) 1000 UNIT/ML IJ SOLN
INTRAMUSCULAR | Status: DC | PRN
  Administered 2019-10-15: 5000 [IU] via INTRAVENOUS
  Administered 2019-10-15: 3000 [IU] via INTRAVENOUS
  Administered 2019-10-15: 7000 [IU] via INTRAVENOUS

## 2019-10-15 MED ORDER — CLOPIDOGREL BISULFATE 300 MG PO TABS
ORAL_TABLET | ORAL | Status: DC | PRN
  Administered 2019-10-15: 600 mg via ORAL

## 2019-10-15 MED ORDER — IOHEXOL 350 MG/ML SOLN
INTRAVENOUS | Status: DC | PRN
  Administered 2019-10-15: 110 mL

## 2019-10-15 MED ORDER — METOPROLOL SUCCINATE ER 100 MG PO TB24
100.0000 mg | ORAL_TABLET | Freq: Two times a day (BID) | ORAL | Status: DC
  Administered 2019-10-15 – 2019-10-16 (×2): 100 mg via ORAL
  Filled 2019-10-15 (×2): qty 1

## 2019-10-15 MED ORDER — HYDROCODONE-ACETAMINOPHEN 5-325 MG PO TABS: 1.0000 | ORAL_TABLET | Freq: Four times a day (QID) | ORAL | Status: DC | PRN

## 2019-10-15 MED ORDER — LIDOCAINE HCL (PF) 1 % IJ SOLN
INTRAMUSCULAR | Status: AC
  Filled 2019-10-15: qty 30

## 2019-10-15 MED ORDER — HYDROCODONE-ACETAMINOPHEN 10-325 MG PO TABS
1.0000 | ORAL_TABLET | Freq: Four times a day (QID) | ORAL | Status: DC | PRN
  Administered 2019-10-15: 1 via ORAL
  Filled 2019-10-15: qty 1

## 2019-10-15 MED ORDER — HYDRALAZINE HCL 20 MG/ML IJ SOLN: 10.0000 mg | INTRAMUSCULAR | Status: AC | PRN

## 2019-10-15 MED ORDER — VERAPAMIL HCL 2.5 MG/ML IV SOLN
INTRAVENOUS | Status: AC
  Filled 2019-10-15: qty 2

## 2019-10-15 MED ORDER — FAMOTIDINE IN NACL 20-0.9 MG/50ML-% IV SOLN
INTRAVENOUS | Status: AC
  Filled 2019-10-15: qty 50

## 2019-10-15 MED ORDER — VERAPAMIL HCL 2.5 MG/ML IV SOLN
INTRAVENOUS | Status: DC | PRN
  Administered 2019-10-15: 10 mL via INTRA_ARTERIAL

## 2019-10-15 MED ORDER — FAMOTIDINE IN NACL 20-0.9 MG/50ML-% IV SOLN
INTRAVENOUS | Status: AC | PRN
  Administered 2019-10-15: 20 mg via INTRAVENOUS

## 2019-10-15 MED ORDER — CLOPIDOGREL BISULFATE 75 MG PO TABS
75.0000 mg | ORAL_TABLET | Freq: Every day | ORAL | Status: DC
  Administered 2019-10-16: 75 mg via ORAL
  Filled 2019-10-15: qty 1

## 2019-10-15 MED ORDER — CHLORHEXIDINE GLUCONATE 4 % EX LIQD: Freq: Every morning | CUTANEOUS | Status: DC

## 2019-10-15 MED ORDER — ACETAMINOPHEN 325 MG PO TABS: 650.0000 mg | ORAL_TABLET | ORAL | Status: DC | PRN

## 2019-10-15 MED ORDER — CIPROFLOXACIN HCL 500 MG PO TABS
500.0000 mg | ORAL_TABLET | Freq: Two times a day (BID) | ORAL | Status: DC
  Administered 2019-10-15 – 2019-10-16 (×3): 500 mg via ORAL
  Filled 2019-10-15 (×3): qty 1

## 2019-10-15 MED ORDER — NITROGLYCERIN 1 MG/10 ML FOR IR/CATH LAB
INTRA_ARTERIAL | Status: DC | PRN
  Administered 2019-10-15 (×2): 200 ug via INTRACORONARY
  Administered 2019-10-15: 400 ug via INTRA_ARTERIAL

## 2019-10-15 MED FILL — CIPROFLOXACIN HCL 500 MG TA: 500 | 10 days supply | Qty: 20 | Fill #0

## 2019-10-15 SURGICAL SUPPLY — 18 items
BALLN SAPPHIRE 2.5X12 (BALLOONS) ×2
BALLN SAPPHIRE ~~LOC~~ 3.25X12 (BALLOONS) ×1 IMPLANT
BALLOON SAPPHIRE 2.5X12 (BALLOONS) IMPLANT
CATH 5FR JL3.5 JR4 ANG PIG MP (CATHETERS) ×1 IMPLANT
CATH LAUNCHER 6FR JR4 (CATHETERS) ×1 IMPLANT
DEVICE RAD COMP TR BAND LRG (VASCULAR PRODUCTS) ×1 IMPLANT
GLIDESHEATH SLEND SS 6F .021 (SHEATH) ×1 IMPLANT
GUIDEWIRE INQWIRE 1.5J.035X260 (WIRE) IMPLANT
INQWIRE 1.5J .035X260CM (WIRE) ×2
KIT ENCORE 26 ADVANTAGE (KITS) ×1 IMPLANT
KIT HEART LEFT (KITS) ×2 IMPLANT
KIT HEMO VALVE WATCHDOG (MISCELLANEOUS) ×1 IMPLANT
PACK CARDIAC CATHETERIZATION (CUSTOM PROCEDURE TRAY) ×2 IMPLANT
STENT SYNERGY XD 2.75X16 (Permanent Stent) IMPLANT
SYNERGY XD 2.75X16 (Permanent Stent) ×2 IMPLANT
TRANSDUCER W/STOPCOCK (MISCELLANEOUS) ×2 IMPLANT
TUBING CIL FLEX 10 FLL-RA (TUBING) ×2 IMPLANT
WIRE ASAHI PROWATER 180CM (WIRE) ×1 IMPLANT

## 2019-10-15 NOTE — Progress Notes (Signed)
  Echocardiogram 2D Echocardiogram has been performed.  Jennette Dubin 10/15/2019, 2:38 PM

## 2019-10-15 NOTE — Progress Notes (Signed)
Per Dr. Burt Knack, resume Eliquis tonight -> plan relayed to pharmD. Loye Reininger PA-C

## 2019-10-15 NOTE — Progress Notes (Signed)
Patient ID: Kevin Buff, MD, male   DOB: [birth year], 85 y.o.   MRN: [id number]  PROGRESS NOTE    Kevin Buff, MD  PXT:[id number] DOB: 09-07-1934 DOA: 10/13/2019 PCP: [name], MD   Brief Narrative:  85 year old male with history of persistent atrial fibrillation status post pacemaker placement, aortic stenosis status post aortic valve replacement in 2012, CAD status post PCI and stent placement, last cath in 2018, hyperlipidemia, previous unspecified CVA, hypothyroidism, MGUS, chronic pain syndrome, orthostatic hypotension presented with chest discomfort, lightheadedness and weakness.  She was found to have A. fib with RVR with heart rates reaching up to 180 by EMS requiring several rounds of adenosine followed by IV diltiazem in the ED.  Cardiology was consulted.  Assessment & Plan:   A. fib with RVR in a patient with history of persistent A. Fib -Initially required IV adenosine followed by IV Cardizem.  Cardiology following.  Currently rate controlled.  Continue oral metoprolol.  Eliquis has been switched to heparin drip by cardiology.  Follow further cardiology recommendations.  Diarrhea -Most likely from recent laxative use.  C. difficile testing has been discontinued.  Enteric precautions has been discontinued.  Positive troponins/NSTEMI in a patient with history of CAD status post PCI and stent placement -Cardiology following: Status post cardiac catheterization and stenting today.  Continue metoprolol and rosuvastatin.  Leukocytosis -Mild.  Resolved  Chronic normocytic anemia/anemia of chronic disease -Questionable cause.  Hemoglobin stable.  Hyperlipidemia -continue ezetimibe and statin  Chronic pain syndrome -Continue home regimen.  BPH  -continue doxazosin  Intermittent orthostatic hypotension History of hypertension -Blood pressure intermittently elevated.  Intermittently also becomes orthostatic.  Currently on low-dose prednisone. -Continue  metoprolol   DVT prophylaxis: Heparin drip Code Status: Full Family Communication: Caretaker at bedside Disposition Plan: Status is: Inpatient  Cardiology is planning for cardiac catheterization today.  Discharge home pending cardiology clearance in 1 to 2 days.   Dispo: The patient is from: Home              Anticipated d/c is to: Home              Anticipated d/c date is: 1 day              Patient currently is not medically stable to d/c.   Consultants: Cardiology  Procedures:  Cardiac catheterization  Prox LAD to Mid LAD lesion is 40% stenosed.  Prox RCA lesion is 30% stenosed.  Mid RCA lesion is 40% stenosed.  Previously placed Mid Cx to Dist Cx stent (unknown type) is widely patent.  Dist RCA lesion is 75% stenosed. This was new compared to prior cath in 2018.  A drug-eluting stent was successfully placed using a SYNERGY XD [id number]. THis was dilated to 3.0 mm and postdilated to 3.4 mm.  Post intervention, there is a 0% residual stenosis.  There is moderate left ventricular systolic dysfunction.  LV end diastolic pressure is normal.  The left ventricular ejection fraction is 35-45% by visual estimate. Global hypokinesis.   No aspirin given Eliquis and Plavix.  Could use Plavix longer if no bleeding issues, but bare minimum would be one month.    Continue aggressive secondary prevention and management of his tachycardia.    Antimicrobials: None   Subjective: Patient seen and examined at bedside.  Denies any chest pain or palpitations.  Diarrhea has improved.   Objective: Vitals:   10/14/19 1100 10/14/19 1959 10/14/19 2137 10/15/19 0511  BP: 130/84 (!) 153/100  (!) 185/100  Pulse: 68 82 99 77  Resp: 20 18  18   Temp: 97.9 F (36.6 C) 98 F (36.7 C)  98 F (36.7 C)  TempSrc: Axillary Oral  Oral  SpO2: 94%   99%  Weight:    100.2 kg  Height:        Intake/Output Summary (Last 24 hours) at 10/15/2019 0738 Last data filed at 10/15/2019 1610 Gross per  24 hour  Intake 1337.44 ml  Output 250 ml  Net 1087.44 ml   Filed Weights   10/13/19 1151 10/14/19 0000 10/15/19 0511  Weight: 100.7 kg 103.7 kg 100.2 kg    Examination:  General exam: No acute distress.   Respiratory system: Bilateral decreased breath sounds at bases, no wheezing Cardiovascular system: Currently rate controlled, S1-S2 heard Gastrointestinal system: Abdomen is nondistended, soft and nontender.  Bowel sounds are heard  extremities: No lower extremity edema or clubbing   Data Reviewed: I have personally reviewed following labs and imaging studies  CBC: Recent Labs  Lab 10/13/19 1158 10/13/19 1737 10/14/19 0208 10/15/19 0624  WBC 11.9*  --  8.5 7.3  NEUTROABS  --   --  6.9 6.6  HGB 11.0* 10.6* 10.2* 11.2*  HCT 34.7* 34.0* 31.6* 35.5*  MCV 91.8  --  88.8 89.4  PLT 192  --  175 960   Basic Metabolic Panel: Recent Labs  Lab 10/13/19 1158 10/13/19 2012 10/14/19 0208 10/15/19 0624  NA 138  --  136 139  K 4.0  --  3.6 3.9  CL 105  --  104 107  CO2 23  --  24 24  GLUCOSE 158*  --  119* 161*  BUN 15  --  11 6*  CREATININE 1.09  --  0.84 0.79  CALCIUM 8.9  --  8.5* 8.9  MG  --  2.3 2.3 2.2   GFR: Estimated Creatinine Clearance: 87.1 mL/min (by C-G formula based on SCr of 0.79 mg/dL). Liver Function Tests: Recent Labs  Lab 10/13/19 1158 10/14/19 0208  AST 32 30  ALT 23 19  ALKPHOS 82 81  BILITOT 0.8 0.8  PROT 7.0 6.1*  ALBUMIN 3.9 3.3*   Recent Labs  Lab 10/13/19 1158  LIPASE 21   No results for input(s): AMMONIA in the last 168 hours. Coagulation Profile: Recent Labs  Lab 10/13/19 1158 10/14/19 0208  INR 1.4* 1.4*   Cardiac Enzymes: No results for input(s): CKTOTAL, CKMB, CKMBINDEX, TROPONINI in the last 168 hours. BNP (last 3 results) Recent Labs    01/31/19 1220  PROBNP 842*   HbA1C: No results for input(s): HGBA1C in the last 72 hours. CBG: No results for input(s): GLUCAP in the last 168 hours. Lipid Profile: No  results for input(s): CHOL, HDL, LDLCALC, TRIG, CHOLHDL, LDLDIRECT in the last 72 hours. Thyroid Function Tests: Recent Labs    10/14/19 0208  TSH 2.231   Anemia Panel: Recent Labs    10/14/19 0208  VITAMINB12 379  FOLATE 24.5  TIBC 287  IRON 31*   Sepsis Labs: No results for input(s): PROCALCITON, LATICACIDVEN in the last 168 hours.  Recent Results (from the past 240 hour(s))  SARS Coronavirus 2 by RT PCR (hospital order, performed in [facility] hospital lab) Nasopharyngeal Nasopharyngeal Swab     Status: None   Collection Time: 10/13/19  3:59 PM   Specimen: Nasopharyngeal Swab  Result Value Ref Range Status   SARS Coronavirus 2 NEGATIVE NEGATIVE Final    Comment: (NOTE) SARS-CoV-2 target nucleic acids are NOT  DETECTED.  The SARS-CoV-2 RNA is generally detectable in upper and lower respiratory specimens during the acute phase of infection. The lowest concentration of SARS-CoV-2 viral copies this assay can detect is 250 copies / mL. A negative result does not preclude SARS-CoV-2 infection and should not be used as the sole basis for treatment or other patient management decisions.  A negative result may occur with improper specimen collection / handling, submission of specimen other than nasopharyngeal swab, presence of viral mutation(s) within the areas targeted by this assay, and inadequate number of viral copies (<250 copies / mL). A negative result must be combined with clinical observations, patient history, and epidemiological information.  Fact Sheet for Patients:   StrictlyIdeas.no  Fact Sheet for Healthcare Providers: BankingDealers.co.za  This test is not yet approved or  cleared by the [place] FDA and has been authorized for detection and/or diagnosis of SARS-CoV-2 by FDA under an Emergency Use Authorization (EUA).  This EUA will remain in effect (meaning this test can be used) for the duration of  the COVID-19 declaration under Section 564(b)(1) of the Act, 21 U.S.C. section 360bbb-3(b)(1), unless the authorization is terminated or revoked sooner.  Performed at [facility], [place] [address] [place], [place] [address]          Radiology Studies: DG Abd 1 View  Result Date: 10/14/2019 CLINICAL DATA:  Constipation. EXAM: ABDOMEN - 1 VIEW COMPARISON:  CT 09/12/2018 FINDINGS: No bowel dilatation to suggest obstruction. There is moderate stool in the right colon, with small volume of stool throughout the remainder of the colon. No abnormal rectal distention. Pelvic calcifications consistent phleboliths. There also vascular calcifications. No acute osseous abnormalities are seen. IMPRESSION: Moderate stool in the right colon, small volume of stool distally. No abnormal rectal distention. Normal bowel gas pattern without obstruction. Electronically Signed   By: Keith Rake M.D.   On: 10/14/2019 01:30   DG Chest Portable 1 View  Result Date: 10/13/2019 CLINICAL DATA:  Supraventricular tachycardia. EXAM: PORTABLE CHEST 1 VIEW COMPARISON:  Chest radiograph dated 02/15/2018. FINDINGS: The heart size is mildly enlarged, unchanged. Both lungs are clear. The visualized skeletal structures are unremarkable. Bilateral subclavian cardiac devices are redemonstrated. Median sternotomy wires, an aortic valve prosthesis, and an atrial appendage clip are redemonstrated. IMPRESSION: No active disease. Electronically Signed   By: Zerita Boers M.D.   On: 10/13/2019 13:20        Scheduled Meds:  cholecalciferol  1,000 Units Oral Daily   diphenhydrAMINE  50 mg Oral Once   Or   diphenhydrAMINE  50 mg Intravenous Once   doxazosin  4 mg Oral QHS   ezetimibe  10 mg Oral Daily   hydrocortisone   Rectal BID   LORazepam  1 mg Oral QHS   magnesium oxide  400 mg Oral Daily   metoprolol tartrate  50 mg Oral BID   predniSONE  5 mg Oral Q breakfast   predniSONE  50 mg Oral Q6H    rosuvastatin  20 mg Oral Daily   senna-docusate  1 tablet Oral BID   sodium chloride flush  3 mL Intravenous Q12H   sodium chloride flush  3 mL Intravenous Q12H   Continuous Infusions:  sodium chloride     sodium chloride 1 mL/kg/hr (10/15/19 0617)   heparin 1,500 Units/hr (10/14/19 2144)          Aline August, MD [facility] 10/15/2019, 7:38 AM

## 2019-10-15 NOTE — Interval H&P Note (Signed)
Cath Lab Visit (complete for each Cath Lab visit)  Clinical Evaluation Leading to the Procedure:   ACS: Yes.    Non-ACS:    Anginal Classification: CCS IV  Anti-ischemic medical therapy: Minimal Therapy (1 class of medications)  Non-Invasive Test Results: No non-invasive testing performed  Prior CABG: No previous CABG      History and Physical Interval Note:  10/15/2019 8:43 AM  [name], MD  has presented today for surgery, with the diagnosis of NSTEMI.  The various methods of treatment have been discussed with the patient and family. After consideration of risks, benefits and other options for treatment, the patient has consented to  Procedure(s): LEFT HEART CATH AND CORONARY ANGIOGRAPHY (N/A) as a surgical intervention.  The patient's history has been reviewed, patient examined, no change in status, stable for surgery.  I have reviewed the patient's chart and labs.  Questions were answered to the patient's satisfaction.     Larae Grooms

## 2019-10-15 NOTE — Progress Notes (Signed)
Progress Note  Patient Name: Kevin Buff, MD Date of Encounter: 10/15/2019  [facility] Cardiologist: Sherren Mocha, MD   Subjective   No specific complaints this morning.  No chest pain at present.  His home nurse, Maudie Mercury, is at the bedside.  No shortness of breath or heart palpitations at rest.  Inpatient Medications    Scheduled Meds: . cholecalciferol  1,000 Units Oral Daily  . doxazosin  4 mg Oral QHS  . ezetimibe  10 mg Oral Daily  . hydrocortisone   Rectal BID  . LORazepam  1 mg Oral QHS  . magnesium oxide  400 mg Oral Daily  . metoprolol tartrate  50 mg Oral BID  . predniSONE  5 mg Oral Q breakfast  . predniSONE  50 mg Oral Q6H  . rosuvastatin  20 mg Oral Daily  . senna-docusate  1 tablet Oral BID  . sodium chloride flush  3 mL Intravenous Q12H  . sodium chloride flush  3 mL Intravenous Q12H   Continuous Infusions: . sodium chloride    . sodium chloride 1 mL/kg/hr (10/15/19 0617)  . heparin 1,500 Units/hr (10/14/19 2144)   PRN Meds: sodium chloride, acetaminophen, butalbital-acetaminophen-caffeine, HYDROcodone-acetaminophen **OR** HYDROcodone-acetaminophen, metoprolol tartrate, nitroGLYCERIN, ondansetron (ZOFRAN) IV, polyethylene glycol, sodium chloride flush   Vital Signs    Vitals:   10/14/19 1100 10/14/19 1959 10/14/19 2137 10/15/19 0511  BP: 130/84 (!) 153/100  (!) 185/100  Pulse: 68 82 99 77  Resp: 20 18  18   Temp: 97.9 F (36.6 C) 98 F (36.7 C)  98 F (36.7 C)  TempSrc: Axillary Oral  Oral  SpO2: 94%   99%  Weight:    100.2 kg  Height:        Intake/Output Summary (Last 24 hours) at 10/15/2019 0833 Last data filed at 10/15/2019 5638 Gross per 24 hour  Intake 1337.44 ml  Output 250 ml  Net 1087.44 ml   Last 3 Weights 10/15/2019 10/14/2019 10/13/2019  Weight (lbs) 220 lb 14.4 oz 228 lb 11.2 oz 222 lb  Weight (kg) 100.2 kg 103.738 kg 100.699 kg      Telemetry    Atrial fibrillation, heart rate 90 to 120 bpm - Personally  Reviewed   Physical Exam  Elderly male in no distress GEN: No acute distress.   Neck: No JVD Cardiac:  Irregularly irregular, widely split S2 Respiratory: Clear to auscultation bilaterally. GI: Soft, nontender, non-distended  MS: No edema; No deformity. Neuro:  Nonfocal  Psych: Normal affect   Labs    High Sensitivity Troponin:   Recent Labs  Lab 10/13/19 1158 10/13/19 1629 10/13/19 2012 10/14/19 0936  TROPONINIHS 120* 833* 1,449* 1,252*      Chemistry Recent Labs  Lab 10/13/19 1158 10/14/19 0208 10/15/19 0624  NA 138 136 139  K 4.0 3.6 3.9  CL 105 104 107  CO2 23 24 24   GLUCOSE 158* 119* 161*  BUN 15 11 6*  CREATININE 1.09 0.84 0.79  CALCIUM 8.9 8.5* 8.9  PROT 7.0 6.1*  --   ALBUMIN 3.9 3.3*  --   AST 32 30  --   ALT 23 19  --   ALKPHOS 82 81  --   BILITOT 0.8 0.8  --   GFRNONAA >60 >60 >60  GFRAA >60 >60 >60  ANIONGAP 10 8 8      Hematology Recent Labs  Lab 10/13/19 1158 10/13/19 1158 10/13/19 1737 10/14/19 0208 10/15/19 0624  WBC 11.9*  --   --  8.5  7.3  RBC 3.78*  --   --  3.56* 3.97*  HGB 11.0*   < > 10.6* 10.2* 11.2*  HCT 34.7*   < > 34.0* 31.6* 35.5*  MCV 91.8  --   --  88.8 89.4  MCH 29.1  --   --  28.7 28.2  MCHC 31.7  --   --  32.3 31.5  RDW 14.4  --   --  14.6 14.4  PLT 192  --   --  175 175   < > = values in this interval not displayed.    BNPNo results for input(s): BNP, PROBNP in the last 168 hours.   DDimer No results for input(s): DDIMER in the last 168 hours.   Radiology    DG Abd 1 View  Result Date: 10/14/2019 CLINICAL DATA:  Constipation. EXAM: ABDOMEN - 1 VIEW COMPARISON:  CT 09/12/2018 FINDINGS: No bowel dilatation to suggest obstruction. There is moderate stool in the right colon, with small volume of stool throughout the remainder of the colon. No abnormal rectal distention. Pelvic calcifications consistent phleboliths. There also vascular calcifications. No acute osseous abnormalities are seen. IMPRESSION: Moderate  stool in the right colon, small volume of stool distally. No abnormal rectal distention. Normal bowel gas pattern without obstruction. Electronically Signed   By: Keith Rake M.D.   On: 10/14/2019 01:30   DG Chest Portable 1 View  Result Date: 10/13/2019 CLINICAL DATA:  Supraventricular tachycardia. EXAM: PORTABLE CHEST 1 VIEW COMPARISON:  Chest radiograph dated 02/15/2018. FINDINGS: The heart size is mildly enlarged, unchanged. Both lungs are clear. The visualized skeletal structures are unremarkable. Bilateral subclavian cardiac devices are redemonstrated. Median sternotomy wires, an aortic valve prosthesis, and an atrial appendage clip are redemonstrated. IMPRESSION: No active disease. Electronically Signed   By: Zerita Boers M.D.   On: 10/13/2019 13:20    Cardiac Studies   Echo 02/13/2019: IMPRESSIONS    1. Left ventricular ejection fraction, by visual estimation, is 55 to  60%. The left ventricle has normal function. There is no left ventricular  hypertrophy.  2. Abnormal septal motion consistent with post-operative status.  3. The left ventricle has no regional wall motion abnormalities.  4. Global right ventricle has normal systolic function.The right  ventricular size is normal. No increase in right ventricular wall  thickness.  5. Left atrial size was moderately dilated.  6. Right atrial size was moderately dilated.  7. Presence of pericardial fat pad.  8. Trivial pericardial effusion is present.  9. Mild to moderate mitral annular calcification.  10. The mitral valve is degenerative. Mild mitral valve regurgitation.  11. The tricuspid valve is grossly normal. Tricuspid valve regurgitation  is mild.  12. Aortic valve regurgitation is not visualized.  13. Bioprosthetic valve in place. Vmax 1.9 m/2, mean gradient 8 mmHG, EOA  1.43 cm2, DI 0.32. Normal functioning prosthetic valve.  14. The pulmonic valve was grossly normal. Pulmonic valve regurgitation is  not  visualized.  15. Normal pulmonary artery systolic pressure.  16. The tricuspid regurgitant velocity is 1.90 m/s, and with an assumed  right atrial pressure of 8 mmHg, the estimated right ventricular systolic  pressure is normal at 22.4 mmHg.  17. A pacer wire is visualized in the RA and RV.  18. The inferior vena cava is dilated in size with >50% respiratory  variability, suggesting right atrial pressure of 8 mmHg.  19. No significant change from prior study.   Myoview stress test 02/13/2019: Study Highlights   Nuclear  stress EF: 48%. Visually, the EF appears to be normal. Echo from today shows normal EF of 55-60%.  There was no ST segment deviation noted during stress.  This is a low risk study. There is no evidence of ischemia or previous infarction.  The study is normal.  Patient Profile     85 y.o. male with atrial tachycardia and rapid ventricular rate presenting with elevated troponin likely consistent with demand ischemia.  Assessment & Plan    1.  Demand ischemia: Troponin elevated to approximately 1500.  Patient with known coronary disease, prior left circumflex stenting, last heart catheterization in 2018 and most recent Myoview stress test in December 2020 with no ischemia.  I agree that repeat cardiac catheterization is reasonable to rule out progressive coronary disease.  The patient's renal function is normal.  Apixaban has been appropriately held.  Should be a candidate for radial cath. I have reviewed the risks, indications, and alternatives to cardiac catheterization, possible angioplasty, and stenting with the patient. Risks include but are not limited to bleeding, infection, vascular injury, stroke, myocardial infection, arrhythmia, kidney injury, radiation-related injury in the case of prolonged fluoroscopy use, emergency cardiac surgery, and death. The patient understands the risks of serious complication is 1-2 in 1157 with diagnostic cardiac cath and 1-2% or less  with angioplasty/stenting.   2.  Atrial tachycardia, rapid ventricular rate: Has not been felt to be a candidate for ablation in the past.  Previously was on metoprolol succinate 100 mg twice daily, wean down to 50 mg twice daily with ongoing fatigue.  He has been switched to metoprolol tartrate in the hospital.  We will give an extra 50 mg of metoprolol this morning for better heart rate control.  Suspect he will need to go back on 100 mg twice daily, defer to EP team whether they want him on tartrate or succinate.  3.  Orthostatic hypotension: Longstanding issue.  Patient with supine hypertension and diastolic blood pressures greater than 100 mmHg.  Will increase his beta-blocker.  Abdominal and thigh binders as recommended by Dr. Caryl Comes.  Question about Mestinon, defer to EP team.  Disposition: Pending cardiac catheterization results      For questions or updates, please contact [place] Please consult [email or web address] for contact info under        Signed, Sherren Mocha, MD  10/15/2019, 8:33 AM

## 2019-10-16 ENCOUNTER — Telehealth: Payer: Self-pay | Admitting: Physician Assistant

## 2019-10-16 ENCOUNTER — Other Ambulatory Visit (HOSPITAL_COMMUNITY): Payer: Self-pay | Admitting: Internal Medicine

## 2019-10-16 DIAGNOSIS — I2510 Atherosclerotic heart disease of native coronary artery without angina pectoris: Secondary | ICD-10-CM | POA: Diagnosis not present

## 2019-10-16 DIAGNOSIS — G894 Chronic pain syndrome: Secondary | ICD-10-CM | POA: Diagnosis not present

## 2019-10-16 DIAGNOSIS — I4891 Unspecified atrial fibrillation: Secondary | ICD-10-CM | POA: Diagnosis not present

## 2019-10-16 DIAGNOSIS — I214 Non-ST elevation (NSTEMI) myocardial infarction: Secondary | ICD-10-CM | POA: Diagnosis not present

## 2019-10-16 LAB — HEMOGLOBIN A1C
Hgb A1c MFr Bld: 6.2 % — ABNORMAL HIGH (ref 4.8–5.6)
Mean Plasma Glucose: 131.24 mg/dL

## 2019-10-16 LAB — CBC
HCT: 33.9 % — ABNORMAL LOW (ref 39.0–52.0)
Hemoglobin: 10.6 g/dL — ABNORMAL LOW (ref 13.0–17.0)
MCH: 28 pg (ref 26.0–34.0)
MCHC: 31.3 g/dL (ref 30.0–36.0)
MCV: 89.4 fL (ref 80.0–100.0)
Platelets: 173 10*3/uL (ref 150–400)
RBC: 3.79 MIL/uL — ABNORMAL LOW (ref 4.22–5.81)
RDW: 14.2 % (ref 11.5–15.5)
WBC: 9.3 10*3/uL (ref 4.0–10.5)
nRBC: 0 % (ref 0.0–0.2)

## 2019-10-16 LAB — BASIC METABOLIC PANEL
Anion gap: 8 (ref 5–15)
BUN: 8 mg/dL (ref 8–23)
CO2: 27 mmol/L (ref 22–32)
Calcium: 9.1 mg/dL (ref 8.9–10.3)
Chloride: 105 mmol/L (ref 98–111)
Creatinine, Ser: 0.87 mg/dL (ref 0.61–1.24)
GFR calc Af Amer: >60 mL/min (ref >60)
GFR calc non Af Amer: >60 mL/min (ref >60)
Glucose, Bld: 112 mg/dL — ABNORMAL HIGH (ref 70–99)
Potassium: 3.8 mmol/L (ref 3.5–5.1)
Sodium: 140 mmol/L (ref 135–145)

## 2019-10-16 LAB — PROTEIN ELECTROPHORESIS, SERUM
A/G Ratio: 1.1 (ref 0.7–1.7)
Albumin ELP: 3.3 g/dL (ref 2.9–4.4)
Alpha-1-Globulin: 0.3 g/dL (ref 0.0–0.4)
Alpha-2-Globulin: 0.7 g/dL (ref 0.4–1.0)
Beta Globulin: 0.7 g/dL (ref 0.7–1.3)
Gamma Globulin: 1.4 g/dL (ref 0.4–1.8)
Globulin, Total: 3.1 g/dL (ref 2.2–3.9)
M-Spike, %: 0.6 g/dL — ABNORMAL HIGH
Total Protein ELP: 6.4 g/dL (ref 6.0–8.5)

## 2019-10-16 LAB — LIPID PANEL
Cholesterol: 134 mg/dL (ref 0–200)
HDL: 78 mg/dL (ref >40)
LDL Cholesterol: 45 mg/dL (ref 0–99)
Total CHOL/HDL Ratio: 1.7 RATIO
Triglycerides: 55 mg/dL (ref <150)
VLDL: 11 mg/dL (ref 0–40)

## 2019-10-16 MED ORDER — ASPIRIN 81 MG PO TBEC: 81.0000 mg | DELAYED_RELEASE_TABLET | Freq: Every day | ORAL | 0 refills | Status: DC

## 2019-10-16 MED ORDER — METOPROLOL SUCCINATE ER 100 MG PO TB24: 100.0000 mg | ORAL_TABLET | Freq: Two times a day (BID) | ORAL | 0 refills | Status: DC

## 2019-10-16 MED ORDER — CIPROFLOXACIN HCL 500 MG PO TABS: 500.0000 mg | ORAL_TABLET | Freq: Two times a day (BID) | ORAL | 0 refills | Status: DC

## 2019-10-16 MED ORDER — PANTOPRAZOLE SODIUM 40 MG PO TBEC: 40.0000 mg | DELAYED_RELEASE_TABLET | Freq: Every day | ORAL | 0 refills | Status: DC

## 2019-10-16 MED ORDER — CLOPIDOGREL BISULFATE 75 MG PO TABS: 75.0000 mg | ORAL_TABLET | Freq: Every day | ORAL | 0 refills | Status: DC

## 2019-10-16 MED ORDER — PANTOPRAZOLE SODIUM 40 MG PO TBEC: 40.0000 mg | DELAYED_RELEASE_TABLET | Freq: Every day | ORAL | Status: DC

## 2019-10-16 MED ORDER — ASPIRIN EC 81 MG PO TBEC: 81.0000 mg | DELAYED_RELEASE_TABLET | Freq: Every day | ORAL | Status: DC

## 2019-10-16 MED ORDER — CHLORHEXIDINE GLUCONATE 4 % EX LIQD: Freq: Every morning | CUTANEOUS | 0 refills | Status: DC

## 2019-10-16 MED FILL — CLOPIDOGREL 75 MG TABLET: 75 | 30 days supply | Qty: 30 | Fill #0

## 2019-10-16 MED FILL — PANTOPRAZOLE SOD DR 40 MG T: 40 | 30 days supply | Qty: 30 | Fill #0

## 2019-10-16 NOTE — Progress Notes (Addendum)
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 Lovena Le   Subjective   No specific complaints this am. Dr. Lovena Le has seen.    Inpatient Medications    Scheduled Meds: . apixaban  5 mg Oral BID  . chlorhexidine   Topical q morning - 10a  . cholecalciferol  1,000 Units Oral Daily  . ciprofloxacin  500 mg Oral BID  . clopidogrel  75 mg Oral Q breakfast  . doxazosin  4 mg Oral QHS  . ezetimibe  10 mg Oral Daily  . hydrocortisone   Rectal BID  . LORazepam  1 mg Oral QHS  . magnesium oxide  400 mg Oral Daily  . metoprolol succinate  100 mg Oral BID  . predniSONE  5 mg Oral Q breakfast  . rosuvastatin  20 mg Oral Daily  . senna-docusate  1 tablet Oral BID  . sodium chloride flush  3 mL Intravenous Q12H  . sodium chloride flush  3 mL Intravenous Q12H  . sodium chloride flush  3 mL Intravenous Q12H   Continuous Infusions: . sodium chloride     PRN Meds: sodium chloride, acetaminophen, butalbital-acetaminophen-caffeine, HYDROcodone-acetaminophen **OR** HYDROcodone-acetaminophen, nitroGLYCERIN, ondansetron (ZOFRAN) IV, polyethylene glycol, sodium chloride flush   Vital Signs    Vitals:   10/15/19 2049 10/16/19 0022 10/16/19 0516 10/16/19 0842  BP: (!) 179/110 (!) 163/108 (!) 175/91 (!) 167/105  Pulse: 94 70 62 95  Resp: 18 20 18    Temp: 98 F (36.7 C) 98 F (36.7 C) 97.8 F (36.6 C)   TempSrc: Oral Oral Oral   SpO2: 99% 98% 98%   Weight:   103.6 kg   Height:        Intake/Output Summary (Last 24 hours) at 10/16/2019 0908 Last data filed at 10/16/2019 0830 Gross per 24 hour  Intake 460 ml  Output --  Net 460 ml   Filed Weights   10/14/19 0000 10/15/19 0511 10/16/19 0516  Weight: 103.7 kg 100.2 kg 103.6 kg    Physical Exam    GEN- The patient is elderly appearing, alert and oriented x 3 today.   Head- normocephalic, atraumatic Eyes-  Sclera  clear, conjunctiva pink Ears- hearing intact Oropharynx- clear Neck- supple Lungs- Clear to ausculation bilaterally, normal work of breathing Heart- irregularly irregular, widely split S2.  GI- soft, NT, ND, + BS Extremities- no clubbing or cyanosis. No edema Skin- no rash or lesion Psych- euthymic mood, full affect Neuro- strength and sensation are intact  Labs    CBC Recent Labs    10/14/19 0208 10/14/19 0208 10/15/19 0624 10/16/19 0355  WBC 8.5   < > 7.3 9.3  NEUTROABS 6.9  --  6.6  --   HGB 10.2*   < > 11.2* 10.6*  HCT 31.6*   < > 35.5* 33.9*  MCV 88.8   < > 89.4 89.4  PLT 175   < > 175 173   < > = values in this interval not displayed.   Basic Metabolic Panel Recent Labs    10/14/19 0208 10/14/19 0208 10/15/19 0624 10/16/19 0355  NA 136   < > 139 140  K 3.6   < > 3.9 3.8  CL 104   < > 107 105  CO2 24   < > 24 27  GLUCOSE 119*   < > 161* 112*  BUN 11   < > 6* 8  CREATININE 0.84   < >  0.79 0.87  CALCIUM 8.5*   < > 8.9 9.1  MG 2.3  --  2.2  --    < > = values in this interval not displayed.   Liver Function Tests Recent Labs    10/13/19 1158 10/14/19 0208  AST 32 30  ALT 23 19  ALKPHOS 82 81  BILITOT 0.8 0.8  PROT 7.0 6.1*  ALBUMIN 3.9 3.3*   Recent Labs    10/13/19 1158  LIPASE 21   Cardiac Enzymes No results for input(s): CKTOTAL, CKMB, CKMBINDEX, TROPONINI in the last 72 hours.   Telemetry    Atrial flutter with variable rates in 60-90s (personally reviewed)  Radiology    CARDIAC CATHETERIZATION  Result Date: 10/15/2019  Prox LAD to Mid LAD lesion is 40% stenosed.  Prox RCA lesion is 30% stenosed.  Mid RCA lesion is 40% stenosed.  Previously placed Mid Cx to Dist Cx stent (unknown type) is widely patent.  Dist RCA lesion is 75% stenosed. This was new compared to prior cath in 2018.  A drug-eluting stent was successfully placed using a SYNERGY XD [id number]. THis was dilated to 3.0 mm and postdilated to 3.4 mm.  Post intervention,  there is a 0% residual stenosis.  There is moderate left ventricular systolic dysfunction.  LV end diastolic pressure is normal.  The left ventricular ejection fraction is 35-45% by visual estimate. Global hypokinesis.  No aspirin given Eliquis and Plavix.  Could use Plavix longer if no bleeding issues, but bare minimum would be one month.  Continue aggressive secondary prevention and management of his tachycardia.   ECHOCARDIOGRAM COMPLETE  Result Date: 10/15/2019    ECHOCARDIOGRAM REPORT   Patient Name:   [name] Date of Exam: 10/15/2019 Medical Rec #:  [id number]        Height:       73.0 in Accession #:    [id number]       Weight:       220.9 lb Date of Birth:  03-14-1934         BSA:          2.244 m Patient Age:    85 years         BP:           146/90 mmHg Patient Gender: M                HR:           77 bpm. Exam Location:  Inpatient Procedure: 2D Echo Indications:    Chest Pain R07.9  History:        Patient has prior history of Echocardiogram examinations, most                 recent 02/13/2019. CAD, Pacemaker, Aortic Valve Disease,                 Arrythmias:Atrial Fibrillation; Risk Factors:Dyslipidemia.                 Aortic Valve: valve is present in the aortic position. Procedure                 Date: 03/15/2011.  Sonographer:    Mikki Santee RDCS (AE) Referring Phys: [place]  1. Mid and basal inferior wall hypokinesis EF has decreased since echo done 02/13/19 . Left ventricular ejection fraction, by estimation, is 45 to 50%. The left ventricle has mildly decreased function. The left ventricle demonstrates regional wall motion abnormalities (  see scoring diagram/findings for description). Left ventricular diastolic parameters are indeterminate.  2. Pacing wires in RA/RV. Right ventricular systolic function is mildly reduced. The right ventricular size is mildly enlarged. There is mildly elevated pulmonary artery systolic pressure.  3. Left atrial size was  moderately dilated.  4. Right atrial size was moderately dilated.  5. The mitral valve is abnormal. Mild mitral valve regurgitation.  6. Tricuspid valve regurgitation is mild to moderate.  7. Bioprosthetic AVR no details on valve type in chart. No PVL stable gradients since echo done 02/13/19 . The aortic valve has been repaired/replaced. Aortic valve regurgitation is not visualized. No aortic stenosis is present. There is a valve present  in the aortic position. Procedure Date: 03/15/2011.  8. Aortic dilatation noted. There is mild dilatation of the ascending aorta measuring 40 mm. FINDINGS  Left Ventricle: Mid and basal inferior wall hypokinesis EF has decreased since echo done 02/13/19. Left ventricular ejection fraction, by estimation, is 45 to 50%. The left ventricle has mildly decreased function. The left ventricle demonstrates regional wall motion abnormalities. The left ventricular internal cavity size was normal in size. There is no left ventricular hypertrophy. Left ventricular diastolic parameters are indeterminate. Right Ventricle: Pacing wires in RA/RV. The right ventricular size is mildly enlarged. Right vetricular wall thickness was not assessed. Right ventricular systolic function is mildly reduced. There is mildly elevated pulmonary artery systolic pressure. The tricuspid regurgitant velocity is 2.79 m/s, and with an assumed right atrial pressure of 8 mmHg, the estimated right ventricular systolic pressure is 97.0 mmHg. Left Atrium: Left atrial size was moderately dilated. Right Atrium: Right atrial size was moderately dilated. Pericardium: There is no evidence of pericardial effusion. Mitral Valve: The mitral valve is abnormal. There is mild thickening of the mitral valve leaflet(s). There is mild calcification of the mitral valve leaflet(s). Severe mitral annular calcification. Mild mitral valve regurgitation. Tricuspid Valve: The tricuspid valve is normal in structure. Tricuspid valve  regurgitation is mild to moderate. Aortic Valve: Bioprosthetic AVR no details on valve type in chart. No PVL stable gradients since echo done 02/13/19. The aortic valve has been repaired/replaced. Aortic valve regurgitation is not visualized. No aortic stenosis is present. Aortic valve mean gradient measures 7.0 mmHg. Aortic valve peak gradient measures 12.4 mmHg. Aortic valve area, by VTI measures 1.42 cm. There is a valve present in the aortic position. Procedure Date: 03/15/2011. Pulmonic Valve: The pulmonic valve was normal in structure. Pulmonic valve regurgitation is mild. Aorta: Aortic dilatation noted. There is mild dilatation of the ascending aorta measuring 40 mm. IAS/Shunts: The interatrial septum was not well visualized.  LEFT VENTRICLE PLAX 2D LVIDd:         6.00 cm  Diastology LVIDs:         4.20 cm  LV e' lateral: 8.92 cm/s LV PW:         1.10 cm  LV e' medial:  4.54 cm/s LV IVS:        1.00 cm LVOT diam:     2.30 cm LV SV:         55 LV SV Index:   25 LVOT Area:     4.15 cm  RIGHT VENTRICLE RV S prime:     10.60 cm/s TAPSE (M-mode): 1.2 cm LEFT ATRIUM              Index       RIGHT ATRIUM           Index LA diam:  5.60 cm  2.50 cm/m  RA Area:     20.90 cm LA Vol (A2C):   114.0 ml 50.79 ml/m RA Volume:   56.20 ml  25.04 ml/m LA Vol (A4C):   120.0 ml 53.46 ml/m LA Biplane Vol: 122.0 ml 54.36 ml/m  AORTIC VALVE AV Area (Vmax):    1.53 cm AV Area (Vmean):   1.42 cm AV Area (VTI):     1.42 cm AV Vmax:           176.00 cm/s AV Vmean:          131.000 cm/s AV VTI:            0.389 m AV Peak Grad:      12.4 mmHg AV Mean Grad:      7.0 mmHg LVOT Vmax:         64.90 cm/s LVOT Vmean:        44.800 cm/s LVOT VTI:          0.133 m LVOT/AV VTI ratio: 0.34  AORTA Ao Root diam: 3.60 cm TRICUSPID VALVE TR Peak grad:   31.1 mmHg TR Vmax:        279.00 cm/s  SHUNTS Systemic VTI:  0.13 m Systemic Diam: 2.30 cm Jenkins Rouge MD Electronically signed by Jenkins Rouge MD Signature Date/Time: 10/15/2019/3:08:20  PM    Final     Patient Profile     Kevin Buff, 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 Found to have elevated troponin and cathed with DES as below.   Has severe baseline orthostasis and IgM gammopathy; ocular myasthenia; AVR/MAze and microvascular angina.    Assessment & Plan    1. Atrial tachycardia/fib/flutter, RVR Rates improved.  Continue apixaban chronically. 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 7 year battery by last check.   2. Orthostatic hypotension Longstanding.  Beta blocker increased in setting of #1 Abdominal and thigh binders have been recommended.   3. Demand ischemia LHC cath 10/15/2019 with RCA stenting (75% lesion) Echo 10/15/2019 with LVEF 45-50%  EP will see as needed while here. 8 week follow up made.   For questions or updates, please contact [place] Please consult [email or web address] for contact info under Cardiology/STEMI.  Signed, Shirley Friar, PA-C  10/16/2019, 9:08 AM   EP Attending  Patient seen and examined. Agree with the findings as noted above. The patient is stable after PCI. HR is well controlled. He will be discharged with toprol 100 bid and plavix and elliquis. I am concerned about bleeding. He will continue his other meds. I suspect that his EF will stabilize.  Carleene Overlie Janeene Sand,MD

## 2019-10-16 NOTE — Progress Notes (Signed)
Progress Note  Patient Name: Kevin Buff, MD Date of Encounter: 10/16/2019  [facility] Cardiologist: Sherren Mocha, MD   Subjective   Feeling better today.  No chest pain or shortness of breath.  Heart rate better controlled on higher dose of metoprolol succinate.  Inpatient Medications    Scheduled Meds: . apixaban  5 mg Oral BID  . chlorhexidine   Topical q morning - 10a  . cholecalciferol  1,000 Units Oral Daily  . ciprofloxacin  500 mg Oral BID  . clopidogrel  75 mg Oral Q breakfast  . doxazosin  4 mg Oral QHS  . ezetimibe  10 mg Oral Daily  . hydrocortisone   Rectal BID  . LORazepam  1 mg Oral QHS  . magnesium oxide  400 mg Oral Daily  . metoprolol succinate  100 mg Oral BID  . predniSONE  5 mg Oral Q breakfast  . rosuvastatin  20 mg Oral Daily  . senna-docusate  1 tablet Oral BID  . sodium chloride flush  3 mL Intravenous Q12H  . sodium chloride flush  3 mL Intravenous Q12H  . sodium chloride flush  3 mL Intravenous Q12H   Continuous Infusions: . sodium chloride     PRN Meds: sodium chloride, acetaminophen, butalbital-acetaminophen-caffeine, HYDROcodone-acetaminophen **OR** HYDROcodone-acetaminophen, nitroGLYCERIN, ondansetron (ZOFRAN) IV, polyethylene glycol, sodium chloride flush   Vital Signs    Vitals:   10/15/19 2049 10/16/19 0022 10/16/19 0516 10/16/19 0842  BP: (!) 179/110 (!) 163/108 (!) 175/91 (!) 167/105  Pulse: 94 70 62 95  Resp: 18 20 18    Temp: 98 F (36.7 C) 98 F (36.7 C) 97.8 F (36.6 C)   TempSrc: Oral Oral Oral   SpO2: 99% 98% 98%   Weight:   103.6 kg   Height:        Intake/Output Summary (Last 24 hours) at 10/16/2019 0954 Last data filed at 10/16/2019 0830 Gross per 24 hour  Intake 460 ml  Output --  Net 460 ml   Last 3 Weights 10/16/2019 10/15/2019 10/14/2019  Weight (lbs) 228 lb 8 oz 220 lb 14.4 oz 228 lb 11.2 oz  Weight (kg) 103.647 kg 100.2 kg 103.738 kg      Telemetry    Atrial flutter with variable AV block,  heart rate currently in the 70s- Personally Reviewed   Physical Exam  Alert, oriented, no distress.  Sitting up in a chair at the bedside. GEN: No acute distress.   Neck: No JVD Cardiac:  Irregularly irregular, no murmurs, rubs, or gallops.  Respiratory: Clear to auscultation bilaterally. GI: Soft, nontender, non-distended  MS: No edema; No deformity.  Right radial site clear. Neuro:  Nonfocal  Psych: Normal affect   Labs    High Sensitivity Troponin:   Recent Labs  Lab 10/13/19 1158 10/13/19 1629 10/13/19 2012 10/14/19 0936  TROPONINIHS 120* 833* 1,449* 1,252*      Chemistry Recent Labs  Lab 10/13/19 1158 10/13/19 1158 10/14/19 0208 10/15/19 0624 10/16/19 0355  NA 138   < > 136 139 140  K 4.0   < > 3.6 3.9 3.8  CL 105   < > 104 107 105  CO2 23   < > 24 24 27   GLUCOSE 158*   < > 119* 161* 112*  BUN 15   < > 11 6* 8  CREATININE 1.09   < > 0.84 0.79 0.87  CALCIUM 8.9   < > 8.5* 8.9 9.1  PROT 7.0  --  6.1*  --   --  ALBUMIN 3.9  --  3.3*  --   --   AST 32  --  30  --   --   ALT 23  --  19  --   --   ALKPHOS 82  --  81  --   --   BILITOT 0.8  --  0.8  --   --   GFRNONAA >60   < > >60 >60 >60  GFRAA >60   < > >60 >60 >60  ANIONGAP 10   < > 8 8 8    < > = values in this interval not displayed.     Hematology Recent Labs  Lab 10/14/19 0208 10/15/19 0624 10/16/19 0355  WBC 8.5 7.3 9.3  RBC 3.56* 3.97* 3.79*  HGB 10.2* 11.2* 10.6*  HCT 31.6* 35.5* 33.9*  MCV 88.8 89.4 89.4  MCH 28.7 28.2 28.0  MCHC 32.3 31.5 31.3  RDW 14.6 14.4 14.2  PLT 175 175 173    BNPNo results for input(s): BNP, PROBNP in the last 168 hours.   DDimer No results for input(s): DDIMER in the last 168 hours.   Radiology    CARDIAC CATHETERIZATION  Result Date: 10/15/2019  Prox LAD to Mid LAD lesion is 40% stenosed.  Prox RCA lesion is 30% stenosed.  Mid RCA lesion is 40% stenosed.  Previously placed Mid Cx to Dist Cx stent (unknown type) is widely patent.  Dist RCA lesion  is 75% stenosed. This was new compared to prior cath in 2018.  A drug-eluting stent was successfully placed using a SYNERGY XD 2.75X16. THis was dilated to 3.0 mm and postdilated to 3.4 mm.  Post intervention, there is a 0% residual stenosis.  There is moderate left ventricular systolic dysfunction.  LV end diastolic pressure is normal.  The left ventricular ejection fraction is 35-45% by visual estimate. Global hypokinesis.  No aspirin given Eliquis and Plavix.  Could use Plavix longer if no bleeding issues, but bare minimum would be one month.  Continue aggressive secondary prevention and management of his tachycardia.   ECHOCARDIOGRAM COMPLETE  Result Date: 10/15/2019    ECHOCARDIOGRAM REPORT   Patient Name:   [name] Date of Exam: 10/15/2019 Medical Rec #:  [id number]        Height:       73.0 in Accession #:    [id number]       Weight:       220.9 lb Date of Birth:  07-06-1934         BSA:          2.244 m Patient Age:    85 years         BP:           146/90 mmHg Patient Gender: M                HR:           77 bpm. Exam Location:  Inpatient Procedure: 2D Echo Indications:    Chest Pain R07.9  History:        Patient has prior history of Echocardiogram examinations, most                 recent 02/13/2019. CAD, Pacemaker, Aortic Valve Disease,                 Arrythmias:Atrial Fibrillation; Risk Factors:Dyslipidemia.                 Aortic Valve: valve is present in  the aortic position. Procedure                 Date: 03/15/2011.  Sonographer:    Mikki Santee RDCS (AE) Referring Phys: [place]  1. Mid and basal inferior wall hypokinesis EF has decreased since echo done 02/13/19 . Left ventricular ejection fraction, by estimation, is 45 to 50%. The left ventricle has mildly decreased function. The left ventricle demonstrates regional wall motion abnormalities (see scoring diagram/findings for description). Left ventricular diastolic parameters are indeterminate.   2. Pacing wires in RA/RV. Right ventricular systolic function is mildly reduced. The right ventricular size is mildly enlarged. There is mildly elevated pulmonary artery systolic pressure.  3. Left atrial size was moderately dilated.  4. Right atrial size was moderately dilated.  5. The mitral valve is abnormal. Mild mitral valve regurgitation.  6. Tricuspid valve regurgitation is mild to moderate.  7. Bioprosthetic AVR no details on valve type in chart. No PVL stable gradients since echo done 02/13/19 . The aortic valve has been repaired/replaced. Aortic valve regurgitation is not visualized. No aortic stenosis is present. There is a valve present  in the aortic position. Procedure Date: 03/15/2011.  8. Aortic dilatation noted. There is mild dilatation of the ascending aorta measuring 40 mm. FINDINGS  Left Ventricle: Mid and basal inferior wall hypokinesis EF has decreased since echo done 02/13/19. Left ventricular ejection fraction, by estimation, is 45 to 50%. The left ventricle has mildly decreased function. The left ventricle demonstrates regional wall motion abnormalities. The left ventricular internal cavity size was normal in size. There is no left ventricular hypertrophy. Left ventricular diastolic parameters are indeterminate. Right Ventricle: Pacing wires in RA/RV. The right ventricular size is mildly enlarged. Right vetricular wall thickness was not assessed. Right ventricular systolic function is mildly reduced. There is mildly elevated pulmonary artery systolic pressure. The tricuspid regurgitant velocity is 2.79 m/s, and with an assumed right atrial pressure of 8 mmHg, the estimated right ventricular systolic pressure is 33.0 mmHg. Left Atrium: Left atrial size was moderately dilated. Right Atrium: Right atrial size was moderately dilated. Pericardium: There is no evidence of pericardial effusion. Mitral Valve: The mitral valve is abnormal. There is mild thickening of the mitral valve leaflet(s). There  is mild calcification of the mitral valve leaflet(s). Severe mitral annular calcification. Mild mitral valve regurgitation. Tricuspid Valve: The tricuspid valve is normal in structure. Tricuspid valve regurgitation is mild to moderate. Aortic Valve: Bioprosthetic AVR no details on valve type in chart. No PVL stable gradients since echo done 02/13/19. The aortic valve has been repaired/replaced. Aortic valve regurgitation is not visualized. No aortic stenosis is present. Aortic valve mean gradient measures 7.0 mmHg. Aortic valve peak gradient measures 12.4 mmHg. Aortic valve area, by VTI measures 1.42 cm. There is a valve present in the aortic position. Procedure Date: 03/15/2011. Pulmonic Valve: The pulmonic valve was normal in structure. Pulmonic valve regurgitation is mild. Aorta: Aortic dilatation noted. There is mild dilatation of the ascending aorta measuring 40 mm. IAS/Shunts: The interatrial septum was not well visualized.  LEFT VENTRICLE PLAX 2D LVIDd:         6.00 cm  Diastology LVIDs:         4.20 cm  LV e' lateral: 8.92 cm/s LV PW:         1.10 cm  LV e' medial:  4.54 cm/s LV IVS:        1.00 cm LVOT diam:     2.30 cm  LV SV:         55 LV SV Index:   25 LVOT Area:     4.15 cm  RIGHT VENTRICLE RV S prime:     10.60 cm/s TAPSE (M-mode): 1.2 cm LEFT ATRIUM              Index       RIGHT ATRIUM           Index LA diam:        5.60 cm  2.50 cm/m  RA Area:     20.90 cm LA Vol (A2C):   114.0 ml 50.79 ml/m RA Volume:   56.20 ml  25.04 ml/m LA Vol (A4C):   120.0 ml 53.46 ml/m LA Biplane Vol: 122.0 ml 54.36 ml/m  AORTIC VALVE AV Area (Vmax):    1.53 cm AV Area (Vmean):   1.42 cm AV Area (VTI):     1.42 cm AV Vmax:           176.00 cm/s AV Vmean:          131.000 cm/s AV VTI:            0.389 m AV Peak Grad:      12.4 mmHg AV Mean Grad:      7.0 mmHg LVOT Vmax:         64.90 cm/s LVOT Vmean:        44.800 cm/s LVOT VTI:          0.133 m LVOT/AV VTI ratio: 0.34  AORTA Ao Root diam: 3.60 cm TRICUSPID VALVE  TR Peak grad:   31.1 mmHg TR Vmax:        279.00 cm/s  SHUNTS Systemic VTI:  0.13 m Systemic Diam: 2.30 cm Jenkins Rouge MD Electronically signed by Jenkins Rouge MD Signature Date/Time: 10/15/2019/3:08:20 PM    Final     Cardiac Studies   2D Echo: 1. Mid and basal inferior wall hypokinesis EF has decreased since echo  done 02/13/19 . Left ventricular ejection fraction, by estimation, is 45  to 50%. The left ventricle has mildly decreased function. The left  ventricle demonstrates regional wall  motion abnormalities (see scoring diagram/findings for description). Left  ventricular diastolic parameters are indeterminate.  2. Pacing wires in RA/RV. Right ventricular systolic function is mildly  reduced. The right ventricular size is mildly enlarged. There is mildly  elevated pulmonary artery systolic pressure.  3. Left atrial size was moderately dilated.  4. Right atrial size was moderately dilated.  5. The mitral valve is abnormal. Mild mitral valve regurgitation.  6. Tricuspid valve regurgitation is mild to moderate.  7. Bioprosthetic AVR no details on valve type in chart. No PVL stable  gradients since echo done 02/13/19 . The aortic valve has been  repaired/replaced. Aortic valve regurgitation is not visualized. No aortic  stenosis is present. There is a valve present  in the aortic position. Procedure Date: 03/15/2011.  8. Aortic dilatation noted. There is mild dilatation of the ascending  aorta measuring 40 mm.   Cath: Conclusion    Prox LAD to Mid LAD lesion is 40% stenosed.  Prox RCA lesion is 30% stenosed.  Mid RCA lesion is 40% stenosed.  Previously placed Mid Cx to Dist Cx stent (unknown type) is widely patent.  Dist RCA lesion is 75% stenosed. This was new compared to prior cath in 2018.  A drug-eluting stent was successfully placed using a SYNERGY XD 2.75X16. THis was dilated to 3.0 mm and postdilated  to 3.4 mm.  Post intervention, there is a 0% residual  stenosis.  There is moderate left ventricular systolic dysfunction.  LV end diastolic pressure is normal.  The left ventricular ejection fraction is 35-45% by visual estimate. Global hypokinesis.   No aspirin given Eliquis and Plavix.  Could use Plavix longer if no bleeding issues, but bare minimum would be one month.    Continue aggressive secondary prevention and management of his tachycardia.   Patient Profile     85 y.o. male presents with atrial tachycardia with rapid ventricular rate and chest pain/elevated troponin consistent with demand ischemia.  Assessment & Plan    1.  Demand ischemia/unstable angina: Cardiac cath data reviewed.  Discussed at length with Dr. Illene Bolus yesterday during the procedure.  Patient underwent uncomplicated PCI of the distal right coronary artery with a drug-eluting stent.  Recommend aspirin 81 mg daily x2 weeks, clopidogrel 75 mg daily times at least 3 months, and patient now back on apixaban.  We will cover him empirically with a proton pump inhibitor (pantoprazole) while he is on multi drug therapy especially in the background of prednisone use.  Will arrange hospital follow-up in 2 weeks.  2.  Atypical atrial flutter/atrial tachycardia: Metoprolol succinate increased to 100 mg twice daily.  Apixaban has been restarted.  Dr. Lovena Le has seen patient while in the hospital.  3.  Orthostatic hypotension: Patient with resting hypertension.  For now we will continue metoprolol succinate alone.  Dr. Caryl Comes has seen the patient and has recommended abdominal and thigh binders.  This has been a longstanding issue for the patient.  Mestinon has been discussed.  This can be followed up as an outpatient.  If the patient remains hypertensive, his home nurse will call and we will make adjustments to his medical regimen.  Disposition: Patient appears stable for discharge from a cardiac perspective.  Hospital follow-up will be arranged.  Recommendations as outlined  above.  [facility] will sign off.   Medication Recommendations:  See above Other recommendations (labs, testing, etc):  none Follow up as an outpatient:  Will arrange 2 week follow-up  For questions or updates, please contact [place] Please consult [email or web address] for contact info under    Signed, Sherren Mocha, MD  10/16/2019, 9:54 AM

## 2019-10-16 NOTE — Telephone Encounter (Signed)
    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 They are being discharged today Follow-up appointment has already been arranged with: (Pending - per Dr. Burt Knack, I sent message to Lenice Llamas to add him in clinic in 2 weeks) They are a patient of Sherren Mocha, MD.  Thank you! Charlie Pitter, PA-C

## 2019-10-16 NOTE — Progress Notes (Signed)
CARDIAC REHAB PHASE I   PRE:  Rate/Rhythm: 80 Afib  BP:  Sitting: 167/105     SaO2: 94 RA  MODE:  Ambulation: 470 ft   POST:  Rate/Rhythm: 103 Afib  BP:  Sitting: 167/105    SaO2: 95 RA  Pt ambulated 422ft in hallway assist of one with unsteady gait. Pt declines use of walker, states he uses furniture at home. Stressed importance of safety and symptom monitoring. Reviewed importance of Plavix and Eliquis with pts private nurse, along with restrictions and exercise guidelines. Will refer to CRP II [place] Rufina Falco, RN BSN 10/16/2019 8:59 AM

## 2019-10-16 NOTE — Discharge Summary (Signed)
Physician Discharge Summary  Kevin Buff, MD ZOX:[id number] DOB: 1934/10/28 DOA: 10/13/2019  PCP: [name], MD  Admit date: 10/13/2019 Discharge date: 10/16/2019  Admitted From: Home Disposition: Home  Recommendations for Outpatient Follow-up:  1. Follow up with PCP in 1 week with repeat CBC/BMP 2. Outpatient follow-up with cardiology 3. Follow up in ED if symptoms worsen or new appear   Home Health: No Equipment/Devices: None  Discharge Condition: Stable CODE STATUS: Full Diet recommendation: Heart healthy  Brief/Interim Summary: 85 year old male with history of persistent atrial fibrillation status post pacemaker placement, aortic stenosis status post aortic valve replacement in 2012, CAD status post PCI and stent placement, last cath in 2018, hyperlipidemia, previous unspecified CVA, hypothyroidism, MGUS, chronic pain syndrome, orthostatic hypotension presented with chest discomfort, lightheadedness and weakness.  She was found to have A. fib with RVR with heart rates reaching up to 180 by EMS requiring several rounds of adenosine followed by IV diltiazem in the ED.  Cardiology was consulted. During hospitalization, he was switched to oral metoprolol by cardiology. Diarrhea improved. He underwent cardiac catheterization and stenting on 10/15/2019. Cardiology has cleared the patient for discharge. He will be discharged home today with outpatient follow-up with cardiology.   Discharge Diagnoses:   A. fib with RVR in a patient with history of persistent A. Fib -Initially required IV adenosine followed by IV Cardizem.  Cardiology following.  Currently rate controlled.   -Continue oral Toprol-XL 100 mg twice a day as per cardiology recommendations.  Continue Eliquis. Cardiology has cleared the patient for discharge with outpatient follow-up with cardiology. Discharge home today.  Positive troponins/NSTEMI in a patient with history of CAD status post PCI and stent  placement -Cardiology following: Status post cardiac catheterization and stenting on 10/15/2019. -Cardiology recommends aspirin 81 mg daily for 2 weeks, Plavix 75 mg daily along with metoprolol and rosuvastatin.  Outpatient follow-up with cardiology in 2 weeks.  Diarrhea -Most likely from recent laxative use.  C. difficile testing has been discontinued.  Enteric precautions has been discontinued. Improved.  Leukocytosis -Mild.  Resolved  Chronic normocytic anemia/anemia of chronic disease -Questionable cause.  Hemoglobin stable.  Hyperlipidemia -continue ezetimibe and statin  Chronic pain syndrome -Continue home regimen.  BPH  -continue doxazosin  Intermittent orthostatic hypotension History of hypertension -Blood pressure intermittently elevated.  Intermittently also becomes orthostatic.  Currently on low-dose prednisone. -Continue metoprolol as above. Outpatient follow-up.  Discharge Instructions  Discharge Instructions    Amb Referral to Cardiac Rehabilitation   Complete by: As directed    Diagnosis: Coronary Stents   After initial evaluation and assessments completed: Virtual Based Care may be provided alone or in conjunction with Phase 2 Cardiac Rehab based on patient barriers.: Yes   Diet - low sodium heart healthy   Complete by: As directed    Increase activity slowly   Complete by: As directed    No wound care   Complete by: As directed      Allergies as of 10/16/2019      Reactions   Contrast Media [iodinated Diagnostic Agents] Hives   Gadolinium Derivatives Hives   Metrizamide Hives      Medication List    STOP taking these medications   metoprolol tartrate 50 MG tablet Commonly known as: LOPRESSOR     TAKE these medications   bisacodyl 5 MG EC tablet Commonly known as: DULCOLAX Take 5 mg by mouth as needed (constipation).   butalbital-acetaminophen-caffeine 50-325-40 MG tablet Commonly known as: FIORICET Take 1 tablet by  mouth 2 (two)  times daily as needed (osteo-arthritis pain).   chlorhexidine 4 % external liquid Commonly known as: HIBICLENS Apply topically every morning.   ciprofloxacin 500 MG tablet Commonly known as: CIPRO Take 1 tablet (500 mg total) by mouth 2 (two) times daily.   clopidogrel 75 MG tablet Commonly known as: PLAVIX Take 1 tablet (75 mg total) by mouth daily with breakfast. Start taking on: October 17, 2019   Coenzyme Q10 100 MG capsule Take 100 mg by mouth daily.   Diclofenac Sodium 1.5 % Soln Take 1 application by mouth 2 (two) times daily as needed (Knee pain).   doxazosin 4 MG tablet Commonly known as: CARDURA Take 4 mg by mouth at bedtime.   Eliquis 5 MG Tabs tablet Generic drug: apixaban TAKE 1 TABLET BY MOUTH 2 TIMES DAILY. What changed: how much to take   ezetimibe 10 MG tablet Commonly known as: Zetia Take 1 tablet (10 mg total) by mouth daily.   HYDROcodone-acetaminophen 10-325 MG tablet Commonly known as: NORCO TAKE 1 TABLET BY MOUTH TWICE DAILY AS NEEDED FOR MODERATE PAIN   LORazepam 1 MG tablet Commonly known as: ATIVAN Take 1 mg by mouth at bedtime.   magnesium oxide 400 MG tablet Commonly known as: MAG-OX Take 400 mg by mouth daily.   metoprolol succinate 100 MG 24 hr tablet Commonly known as: TOPROL-XL Take 1 tablet (100 mg total) by mouth 2 (two) times daily. Take with or immediately following a meal. What changed:   medication strength  how much to take  when to take this   nitroGLYCERIN 0.4 MG SL tablet Commonly known as: NITROSTAT PLACE 1 TABLET UNDER THE TONGUE EVERY 5 MINUTES AS NEEDED FOR CHEST PAIN. What changed: See the new instructions.   OVER THE COUNTER MEDICATION Apply 1 patch topically daily as needed (neck and shoulder pain). Chinese pain patch   potassium chloride SA 20 MEQ tablet Commonly known as: KLOR-CON TAKE 2 TABLETS BY MOUTH DAILY. What changed: how much to take   predniSONE 5 MG tablet Commonly known as:  DELTASONE Take 5 mg by mouth daily with breakfast.   rosuvastatin 20 MG tablet Commonly known as: CRESTOR TAKE 1 TABLET BY MOUTH AT BEDTIME. What changed: when to take this   Vitamin D3 50 MCG (2000 UT) capsule Take 1,000 Units by mouth daily.       Follow-up Information    Evans Lance, MD Follow up on 12/18/2019.   Specialty: Cardiology Why: at 1145 for post hospital follow up Contact information: [address] [place] [address] [phone]        Altheimer, Legrand Como, MD. Schedule an appointment as soon as possible for a visit in 1 week(s).   Specialty: Endocrinology Contact information: [place] [address] [phone]        Sherren Mocha, MD .   Specialty: Cardiology Contact information: [address] [place] [address] [phone]              Allergies  Allergen Reactions  . Contrast Media [Iodinated Diagnostic Agents] Hives  . Gadolinium Derivatives Hives  . Metrizamide Hives    Consultations:  Cardiology/EP   Procedures/Studies: DG Abd 1 View  Result Date: 10/14/2019 CLINICAL DATA:  Constipation. EXAM: ABDOMEN - 1 VIEW COMPARISON:  CT 09/12/2018 FINDINGS: No bowel dilatation to suggest obstruction. There is moderate stool in the right colon, with small volume of stool throughout the remainder of  the colon. No abnormal rectal distention. Pelvic calcifications consistent phleboliths. There also vascular calcifications. No acute osseous abnormalities are seen. IMPRESSION: Moderate stool in the right colon, small volume of stool distally. No abnormal rectal distention. Normal bowel gas pattern without obstruction. Electronically Signed   By: Keith Rake M.D.   On: 10/14/2019 01:30   CARDIAC CATHETERIZATION  Result Date: 10/15/2019  Prox LAD to Mid LAD lesion is 40% stenosed.  Prox RCA lesion is 30% stenosed.  Mid RCA lesion is 40% stenosed.  Previously placed  Mid Cx to Dist Cx stent (unknown type) is widely patent.  Dist RCA lesion is 75% stenosed. This was new compared to prior cath in 2018.  A drug-eluting stent was successfully placed using a SYNERGY XD [id number]. THis was dilated to 3.0 mm and postdilated to 3.4 mm.  Post intervention, there is a 0% residual stenosis.  There is moderate left ventricular systolic dysfunction.  LV end diastolic pressure is normal.  The left ventricular ejection fraction is 35-45% by visual estimate. Global hypokinesis.  No aspirin given Eliquis and Plavix.  Could use Plavix longer if no bleeding issues, but bare minimum would be one month.  Continue aggressive secondary prevention and management of his tachycardia.   DG Chest Portable 1 View  Result Date: 10/13/2019 CLINICAL DATA:  Supraventricular tachycardia. EXAM: PORTABLE CHEST 1 VIEW COMPARISON:  Chest radiograph dated 02/15/2018. FINDINGS: The heart size is mildly enlarged, unchanged. Both lungs are clear. The visualized skeletal structures are unremarkable. Bilateral subclavian cardiac devices are redemonstrated. Median sternotomy wires, an aortic valve prosthesis, and an atrial appendage clip are redemonstrated. IMPRESSION: No active disease. Electronically Signed   By: Zerita Boers M.D.   On: 10/13/2019 13:20   ECHOCARDIOGRAM COMPLETE  Result Date: 10/15/2019    ECHOCARDIOGRAM REPORT   Patient Name:   [name] Date of Exam: 10/15/2019 Medical Rec #:  [id number]        Height:       73.0 in Accession #:    [id number]       Weight:       220.9 lb Date of Birth:  Apr 05, 1934         BSA:          2.244 m Patient Age:    85 years         BP:           146/90 mmHg Patient Gender: M                HR:           77 bpm. Exam Location:  Inpatient Procedure: 2D Echo Indications:    Chest Pain R07.9  History:        Patient has prior history of Echocardiogram examinations, most                 recent 02/13/2019. CAD, Pacemaker, Aortic Valve Disease,                  Arrythmias:Atrial Fibrillation; Risk Factors:Dyslipidemia.                 Aortic Valve: valve is present in the aortic position. Procedure                 Date: 03/15/2011.  Sonographer:    Mikki Santee RDCS (AE) Referring Phys: [place]  1. Mid and basal inferior wall hypokinesis EF has decreased since echo done 02/13/19 . Left ventricular  ejection fraction, by estimation, is 45 to 50%. The left ventricle has mildly decreased function. The left ventricle demonstrates regional wall motion abnormalities (see scoring diagram/findings for description). Left ventricular diastolic parameters are indeterminate.  2. Pacing wires in RA/RV. Right ventricular systolic function is mildly reduced. The right ventricular size is mildly enlarged. There is mildly elevated pulmonary artery systolic pressure.  3. Left atrial size was moderately dilated.  4. Right atrial size was moderately dilated.  5. The mitral valve is abnormal. Mild mitral valve regurgitation.  6. Tricuspid valve regurgitation is mild to moderate.  7. Bioprosthetic AVR no details on valve type in chart. No PVL stable gradients since echo done 02/13/19 . The aortic valve has been repaired/replaced. Aortic valve regurgitation is not visualized. No aortic stenosis is present. There is a valve present  in the aortic position. Procedure Date: 03/15/2011.  8. Aortic dilatation noted. There is mild dilatation of the ascending aorta measuring 40 mm. FINDINGS  Left Ventricle: Mid and basal inferior wall hypokinesis EF has decreased since echo done 02/13/19. Left ventricular ejection fraction, by estimation, is 45 to 50%. The left ventricle has mildly decreased function. The left ventricle demonstrates regional wall motion abnormalities. The left ventricular internal cavity size was normal in size. There is no left ventricular hypertrophy. Left ventricular diastolic parameters are indeterminate. Right Ventricle: Pacing wires in RA/RV. The  right ventricular size is mildly enlarged. Right vetricular wall thickness was not assessed. Right ventricular systolic function is mildly reduced. There is mildly elevated pulmonary artery systolic pressure. The tricuspid regurgitant velocity is 2.79 m/s, and with an assumed right atrial pressure of 8 mmHg, the estimated right ventricular systolic pressure is 71.6 mmHg. Left Atrium: Left atrial size was moderately dilated. Right Atrium: Right atrial size was moderately dilated. Pericardium: There is no evidence of pericardial effusion. Mitral Valve: The mitral valve is abnormal. There is mild thickening of the mitral valve leaflet(s). There is mild calcification of the mitral valve leaflet(s). Severe mitral annular calcification. Mild mitral valve regurgitation. Tricuspid Valve: The tricuspid valve is normal in structure. Tricuspid valve regurgitation is mild to moderate. Aortic Valve: Bioprosthetic AVR no details on valve type in chart. No PVL stable gradients since echo done 02/13/19. The aortic valve has been repaired/replaced. Aortic valve regurgitation is not visualized. No aortic stenosis is present. Aortic valve mean gradient measures 7.0 mmHg. Aortic valve peak gradient measures 12.4 mmHg. Aortic valve area, by VTI measures 1.42 cm. There is a valve present in the aortic position. Procedure Date: 03/15/2011. Pulmonic Valve: The pulmonic valve was normal in structure. Pulmonic valve regurgitation is mild. Aorta: Aortic dilatation noted. There is mild dilatation of the ascending aorta measuring 40 mm. IAS/Shunts: The interatrial septum was not well visualized.  LEFT VENTRICLE PLAX 2D LVIDd:         6.00 cm  Diastology LVIDs:         4.20 cm  LV e' lateral: 8.92 cm/s LV PW:         1.10 cm  LV e' medial:  4.54 cm/s LV IVS:        1.00 cm LVOT diam:     2.30 cm LV SV:         55 LV SV Index:   25 LVOT Area:     4.15 cm  RIGHT VENTRICLE RV S prime:     10.60 cm/s TAPSE (M-mode): 1.2 cm LEFT ATRIUM  Index       RIGHT ATRIUM           Index LA diam:        5.60 cm  2.50 cm/m  RA Area:     20.90 cm LA Vol (A2C):   114.0 ml 50.79 ml/m RA Volume:   56.20 ml  25.04 ml/m LA Vol (A4C):   120.0 ml 53.46 ml/m LA Biplane Vol: 122.0 ml 54.36 ml/m  AORTIC VALVE AV Area (Vmax):    1.53 cm AV Area (Vmean):   1.42 cm AV Area (VTI):     1.42 cm AV Vmax:           176.00 cm/s AV Vmean:          131.000 cm/s AV VTI:            0.389 m AV Peak Grad:      12.4 mmHg AV Mean Grad:      7.0 mmHg LVOT Vmax:         64.90 cm/s LVOT Vmean:        44.800 cm/s LVOT VTI:          0.133 m LVOT/AV VTI ratio: 0.34  AORTA Ao Root diam: 3.60 cm TRICUSPID VALVE TR Peak grad:   31.1 mmHg TR Vmax:        279.00 cm/s  SHUNTS Systemic VTI:  0.13 m Systemic Diam: 2.30 cm Jenkins Rouge MD Electronically signed by Jenkins Rouge MD Signature Date/Time: 10/15/2019/3:08:20 PM    Final        Subjective: Patient seen and examined at bedside. He thinks that he is ready for discharge today. Concerned about his blood pressure fluctuating up and down. No diarrhea. No chest pain, shortness of breath or abdominal pain.  Discharge Exam: Vitals:   10/16/19 0516 10/16/19 0842  BP: (!) 175/91 (!) 167/105  Pulse: 62 95  Resp: 18   Temp: 97.8 F (36.6 C)   SpO2: 98%     General: Pt is alert, awake, not in acute distress Cardiovascular: rate controlled, S1/S2 + Respiratory: bilateral decreased breath sounds at bases Abdominal: Soft, NT, ND, bowel sounds + Extremities: no edema, no cyanosis    The results of significant diagnostics from this hospitalization (including imaging, microbiology, ancillary and laboratory) are listed below for reference.     Microbiology: Recent Results (from the past 240 hour(s))  SARS Coronavirus 2 by RT PCR (hospital order, performed in [facility] hospital lab) Nasopharyngeal Nasopharyngeal Swab     Status: None   Collection Time: 10/13/19  3:59 PM   Specimen: Nasopharyngeal Swab  Result Value Ref  Range Status   SARS Coronavirus 2 NEGATIVE NEGATIVE Final    Comment: (NOTE) SARS-CoV-2 target nucleic acids are NOT DETECTED.  The SARS-CoV-2 RNA is generally detectable in upper and lower respiratory specimens during the acute phase of infection. The lowest concentration of SARS-CoV-2 viral copies this assay can detect is 250 copies / mL. A negative result does not preclude SARS-CoV-2 infection and should not be used as the sole basis for treatment or other patient management decisions.  A negative result may occur with improper specimen collection / handling, submission of specimen other than nasopharyngeal swab, presence of viral mutation(s) within the areas targeted by this assay, and inadequate number of viral copies (<250 copies / mL). A negative result must be combined with clinical observations, patient history, and epidemiological information.  Fact Sheet for Patients:   StrictlyIdeas.no  Fact Sheet for Healthcare Providers:  BankingDealers.co.za  This test is not yet approved or  cleared by the [place] and has been authorized for detection and/or diagnosis of SARS-CoV-2 by FDA under an Emergency Use Authorization (EUA).  This EUA will remain in effect (meaning this test can be used) for the duration of the COVID-19 declaration under Section 564(b)(1) of the Act, 21 U.S.C. section 360bbb-3(b)(1), unless the authorization is terminated or revoked sooner.  Performed at [facility], [place] [address] [place],  [address]      Labs: BNP (last 3 results) No results for input(s): BNP in the last 8760 hours. Basic Metabolic Panel: Recent Labs  Lab 10/13/19 1158 10/13/19 2012 10/14/19 0208 10/15/19 0624 10/16/19 0355  NA 138  --  136 139 140  K 4.0  --  3.6 3.9 3.8  CL 105  --  104 107 105  CO2 23  --  24 24 27   GLUCOSE 158*  --  119* 161* 112*  BUN 15  --  11 6* 8  CREATININE 1.09  --  0.84 0.79  0.87  CALCIUM 8.9  --  8.5* 8.9 9.1  MG  --  2.3 2.3 2.2  --    Liver Function Tests: Recent Labs  Lab 10/13/19 1158 10/14/19 0208  AST 32 30  ALT 23 19  ALKPHOS 82 81  BILITOT 0.8 0.8  PROT 7.0 6.1*  ALBUMIN 3.9 3.3*   Recent Labs  Lab 10/13/19 1158  LIPASE 21   No results for input(s): AMMONIA in the last 168 hours. CBC: Recent Labs  Lab 10/13/19 1158 10/13/19 1737 10/14/19 0208 10/15/19 0624 10/16/19 0355  WBC 11.9*  --  8.5 7.3 9.3  NEUTROABS  --   --  6.9 6.6  --   HGB 11.0* 10.6* 10.2* 11.2* 10.6*  HCT 34.7* 34.0* 31.6* 35.5* 33.9*  MCV 91.8  --  88.8 89.4 89.4  PLT 192  --  175 175 173   Cardiac Enzymes: No results for input(s): CKTOTAL, CKMB, CKMBINDEX, TROPONINI in the last 168 hours. BNP: Invalid input(s): POCBNP CBG: No results for input(s): GLUCAP in the last 168 hours. D-Dimer No results for input(s): DDIMER in the last 72 hours. Hgb A1c Recent Labs    10/16/19 0355  HGBA1C 6.2*   Lipid Profile Recent Labs    10/16/19 0355  CHOL 134  HDL 78  LDLCALC 45  TRIG 55  CHOLHDL 1.7   Thyroid function studies Recent Labs    10/14/19 0208  TSH 2.231   Anemia work up Recent Labs    10/14/19 0208  VITAMINB12 379  FOLATE 24.5  TIBC 287  IRON 31*   Urinalysis    Component Value Date/Time   COLORURINE YELLOW 10/13/2019 [place] 10/13/2019 1524   LABSPEC 1.014 10/13/2019 1524   PHURINE 6.0 10/13/2019 1524   GLUCOSEU NEGATIVE 10/13/2019 1524   GLUCOSEU NEGATIVE 04/07/2011 1305   [place] 10/13/2019 [place] 10/13/2019 1524   [place] 10/13/2019 1524   [place] 10/13/2019 1524   UROBILINOGEN 0.2 04/07/2011 1305   NITRITE NEGATIVE 10/13/2019 1524   LEUKOCYTESUR NEGATIVE 10/13/2019 1524   Sepsis Labs Invalid input(s): PROCALCITONIN,  WBC,  LACTICIDVEN Microbiology Recent Results (from the past 240 hour(s))  SARS Coronavirus 2 by RT PCR (hospital order, performed in  [facility] hospital lab) Nasopharyngeal Nasopharyngeal Swab     Status: None   Collection Time: 10/13/19  3:59 PM   Specimen: Nasopharyngeal Swab  Result Value Ref Range Status   SARS Coronavirus 2 NEGATIVE NEGATIVE Final    Comment: (NOTE) SARS-CoV-2 target nucleic acids are NOT DETECTED.  The SARS-CoV-2 RNA is generally detectable in upper and lower respiratory specimens during the acute phase of infection. The lowest concentration of SARS-CoV-2 viral copies this assay can detect is 250 copies / mL. A negative result does not preclude SARS-CoV-2 infection and should not be used as the sole basis for treatment or other patient management decisions.  A negative result may occur with improper specimen collection / handling, submission of specimen other than nasopharyngeal swab, presence of viral mutation(s) within the areas targeted by this assay, and inadequate number of viral copies (<250 copies / mL). A negative result must be combined with clinical observations, patient history, and epidemiological information.  Fact Sheet for Patients:   StrictlyIdeas.no  Fact Sheet for Healthcare Providers: BankingDealers.co.za  This test is not yet approved or  cleared by the [place] FDA and has been authorized for detection and/or diagnosis of SARS-CoV-2 by FDA under an Emergency Use Authorization (EUA).  This EUA will remain in effect (meaning this test can be used) for the duration of the COVID-19 declaration under Section 564(b)(1) of the Act, 21 U.S.C. section 360bbb-3(b)(1), unless the authorization is terminated or revoked sooner.  Performed at [facility], [place] [address] [place], [place] [address]      Time coordinating discharge: 35 minutes  SIGNED:   Aline August, MD  [facility] 10/16/2019, 9:26 AM

## 2019-10-16 NOTE — Progress Notes (Signed)
Patient had a 13 beat run of VT at 2245. Upon assessment patient stated feeling fine; pt denied chest pain, palpitations and shortness of breath. Will continue to monitor closely.

## 2019-10-17 NOTE — Telephone Encounter (Signed)
Scheduled Dr. Lorin Picket 8/30 with Dr. Burt Knack. He was grateful for assistance.

## 2019-10-17 NOTE — Telephone Encounter (Signed)
**Note De-Identified [name] Obfuscation** Patient contacted regarding discharge from [facility] on 10/16/2019.  Patient understands to follow up with provider Dr Burt Knack in 2 weeks. He is aware that Dr Antionette Char nurse will be contacting him to schedule his post hospital f/u at [address], [place] in [place], [place] [address]. Patient understands discharge instructions? Yes Patient understands medications and regiment? Yes Patient understands to bring all medications to this visit? Yes  Ask patient:  Are you enrolled in My Chart: Yes  The patient states that he is doing well today and is without any complaints or concerns at this time. He does want the entire [facility] MDs to know that he appreciates them all greatly and that they took the BEST care of him while he was in the hospital as always.

## 2019-10-18 ENCOUNTER — Ambulatory Visit (HOSPITAL_COMMUNITY)
Admission: RE | Admit: 2019-10-18 | Discharge: 2019-10-18 | Disposition: A | Discharge status: Home / Self Care | Payer: Medicare Other | Source: Ambulatory Visit | Attending: Internal Medicine | Admitting: Internal Medicine

## 2019-10-18 ENCOUNTER — Other Ambulatory Visit: Payer: Self-pay

## 2019-10-18 ENCOUNTER — Telehealth: Payer: Self-pay

## 2019-10-18 DIAGNOSIS — R10812 Left upper quadrant abdominal tenderness: Secondary | ICD-10-CM | POA: Diagnosis not present

## 2019-10-18 DIAGNOSIS — K5732 Diverticulitis of large intestine without perforation or abscess without bleeding: Secondary | ICD-10-CM | POA: Diagnosis not present

## 2019-10-18 DIAGNOSIS — I700 Atherosclerosis of aorta: Secondary | ICD-10-CM | POA: Diagnosis not present

## 2019-10-18 DIAGNOSIS — K8020 Calculus of gallbladder without cholecystitis without obstruction: Secondary | ICD-10-CM | POA: Diagnosis not present

## 2019-10-18 DIAGNOSIS — N400 Enlarged prostate without lower urinary tract symptoms: Secondary | ICD-10-CM | POA: Diagnosis not present

## 2019-10-18 MED ORDER — CIPROFLOXACIN HCL 500 MG PO TABS
500.0000 mg | ORAL_TABLET | Freq: Two times a day (BID) | ORAL | 0 refills | Status: DC
Start: 2019-10-18 — End: 2019-10-19

## 2019-10-18 MED ORDER — METRONIDAZOLE 500 MG PO TABS
500.0000 mg | ORAL_TABLET | Freq: Two times a day (BID) | ORAL | 0 refills | Status: DC
Start: 2019-10-18 — End: 2019-10-19

## 2019-10-18 MED FILL — METRONIDAZOLE 500 MG TABS: 500 | 10 days supply | Qty: 20 | Fill #0

## 2019-10-18 NOTE — Telephone Encounter (Signed)
Contacted by Dr. Carlean Purl , patient called him this am with LLQ pain.  Patient needs a CT today or tomorrow. Patient has been scheduled at [facility] for today at 1:00.  Patient's caregiver Maudie Mercury Card notified of instructions. They will come pick up the oral contrast and drink at 11:00 and 12:00

## 2019-10-18 NOTE — Addendum Note (Signed)
Addended by: Marlon Pel on: 10/18/2019 04:24 PM   Modules accepted: Orders

## 2019-10-18 NOTE — Telephone Encounter (Signed)
Excellent.  Thank you 

## 2019-10-18 NOTE — Telephone Encounter (Addendum)
CT results of:  Mild to moderate acute sigmoid diverticulitis. No evidence of abscess or other complication.  Stable enlarged prostate and findings of chronic bladder outlet obstruction.  Cholelithiasis. No radiographic evidence of cholecystitis.  Aortic Atherosclerosis (ICD10-I70.0).  Reviewed with Dr. Henrene Pastor.  Per Dr. Henrene Pastor contact patient and caregiver with results and rx cipro and flagyl or Augmentin. Patient and caregiver Maudie Mercury Card notified of the results and Dr. Henrene Pastor recommendations.   Patient and caregiver prefer cipro and flagyl.  Rx sent for Cipro 500 mg BID, flagyl 500 mg BID for 10 days.  Patient asked to call back if not improved in the next few days. Rx sent to [facility].  Pharmacy contacted and notified to hold rx for pick up and not mail rx, per patient

## 2019-10-19 ENCOUNTER — Telehealth (HOSPITAL_COMMUNITY): Payer: Self-pay

## 2019-10-19 ENCOUNTER — Encounter: Payer: Self-pay | Admitting: Internal Medicine

## 2019-10-19 ENCOUNTER — Telehealth: Payer: Self-pay | Admitting: Internal Medicine

## 2019-10-19 ENCOUNTER — Other Ambulatory Visit: Payer: Self-pay | Admitting: Internal Medicine

## 2019-10-19 DIAGNOSIS — K5732 Diverticulitis of large intestine without perforation or abscess without bleeding: Secondary | ICD-10-CM

## 2019-10-19 DIAGNOSIS — K5792 Diverticulitis of intestine, part unspecified, without perforation or abscess without bleeding: Secondary | ICD-10-CM

## 2019-10-19 MED ORDER — AMOXICILLIN-POT CLAVULANATE 875-125 MG PO TABS: 1.0000 | ORAL_TABLET | Freq: Two times a day (BID) | ORAL | 0 refills | Status: DC

## 2019-10-19 MED FILL — AMOX-CLAV 875-125 MG TABLET: 875-125 | 10 days supply | Qty: 20 | Fill #0

## 2019-10-19 NOTE — Telephone Encounter (Signed)
Reviewed CT results - he was aware from contact with Scarlette Shorts  Sigmoid diverticulitis  We discussed and following plans made:\  1) Augmentin 875 mg bid x 10 d Rx sent 2) cipro and metronidazole dc (he is worried about QT prolongation) 3) miralax qd-bid

## 2019-10-19 NOTE — Telephone Encounter (Signed)
Called patient to see if he is interested in the Cardiac Rehab Program. Patient expressed interest. Explained scheduling process and went over insurance, patient verbalized understanding. Will contact patient for scheduling once f/u has been completed.  °

## 2019-10-19 NOTE — Telephone Encounter (Signed)
Pt insurance is active and benefits verified through Medicare A/B. Co-pay $0.00, DED $203.00/$203.00 met, out of pocket $0.00/$0.00 met, co-insurance 20%. No pre-authorization required. Passport, 10/19/19 @ 12:21AM, [phone]  2ndary insurance is active and benefits verified through [place]. Co-pay $0.00, DED $0.00/$0.00 met, out of pocket $0.00/$0.00 met, co-insurance 0%. No pre-authorization required.  Will contact patient to see if he is interested in the Cardiac Rehab Program. If interested, patient will need to complete follow up appt. Once completed, patient will be contacted for scheduling upon review by the RN Navigator.

## 2019-10-23 ENCOUNTER — Other Ambulatory Visit: Payer: Self-pay | Admitting: *Deleted

## 2019-10-23 MED FILL — CIPROFLOXACIN HCL 500 MG TA: 500 | 10 days supply | Qty: 20 | Fill #0

## 2019-10-23 NOTE — Patient Outreach (Signed)
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 Pt has d/c paperwork with review. No additional barriers or needs at this time.   Plan: No needs will close this EMMI with resolution.  Raina Mina, RN Care Management Coordinator [place] Office [phone]

## 2019-10-24 ENCOUNTER — Other Ambulatory Visit: Payer: Self-pay | Admitting: Rheumatology

## 2019-10-24 ENCOUNTER — Other Ambulatory Visit: Payer: Self-pay | Admitting: Physician Assistant

## 2019-10-24 MED FILL — LORazepam 1 MG TABS: 1 | 60 days supply | Qty: 60 | Fill #2

## 2019-10-24 MED FILL — HYDROCODON-APAP 10-325: 10-325 | 30 days supply | Qty: 60 | Fill #0

## 2019-10-24 NOTE — Telephone Encounter (Signed)
Last Visit:02/14/2019 telemedicine Next Visit:patient will schedule UDS: 07/21/2019 Narc Agreement: 07/24/2019  Last Fill: 09/21/2019  Okay to refill Hydrocodone?

## 2019-10-29 ENCOUNTER — Ambulatory Visit (INDEPENDENT_AMBULATORY_CARE_PROVIDER_SITE_OTHER): Payer: Medicare Other | Admitting: Cardiovascular Disease

## 2019-10-29 ENCOUNTER — Other Ambulatory Visit: Payer: Self-pay

## 2019-10-29 ENCOUNTER — Other Ambulatory Visit: Payer: Self-pay | Admitting: Cardiovascular Disease

## 2019-10-29 ENCOUNTER — Encounter: Payer: Self-pay | Admitting: Cardiovascular Disease

## 2019-10-29 VITALS — BP 142/98 | HR 66 | Ht 73.0 in | Wt 222.0 lb

## 2019-10-29 DIAGNOSIS — I5032 Chronic diastolic (congestive) heart failure: Secondary | ICD-10-CM

## 2019-10-29 DIAGNOSIS — R319 Hematuria, unspecified: Secondary | ICD-10-CM | POA: Diagnosis not present

## 2019-10-29 DIAGNOSIS — I951 Orthostatic hypotension: Secondary | ICD-10-CM | POA: Diagnosis not present

## 2019-10-29 DIAGNOSIS — I359 Nonrheumatic aortic valve disorder, unspecified: Secondary | ICD-10-CM

## 2019-10-29 DIAGNOSIS — I209 Angina pectoris, unspecified: Secondary | ICD-10-CM | POA: Diagnosis not present

## 2019-10-29 DIAGNOSIS — I484 Atypical atrial flutter: Secondary | ICD-10-CM

## 2019-10-29 DIAGNOSIS — I25119 Atherosclerotic heart disease of native coronary artery with unspecified angina pectoris: Secondary | ICD-10-CM

## 2019-10-29 LAB — CBC WITH DIFFERENTIAL/PLATELET
Basophils Absolute: 0.1 10*3/uL (ref 0.0–0.2)
Basos: 1 %
EOS (ABSOLUTE): 0.2 10*3/uL (ref 0.0–0.4)
Eos: 3 %
Hematocrit: 34.6 % — ABNORMAL LOW (ref 37.5–51.0)
Hemoglobin: 11.4 g/dL — ABNORMAL LOW (ref 13.0–17.7)
Immature Grans (Abs): 0 10*3/uL (ref 0.0–0.1)
Immature Granulocytes: 0 %
Lymphocytes Absolute: 1 10*3/uL (ref 0.7–3.1)
Lymphs: 13 %
MCH: 28.8 pg (ref 26.6–33.0)
MCHC: 32.9 g/dL (ref 31.5–35.7)
MCV: 87 fL (ref 79–97)
Monocytes Absolute: 0.4 10*3/uL (ref 0.1–0.9)
Monocytes: 5 %
Neutrophils Absolute: 6.3 10*3/uL (ref 1.4–7.0)
Neutrophils: 78 %
Platelets: 258 10*3/uL (ref 150–450)
RBC: 3.96 x10E6/uL — ABNORMAL LOW (ref 4.14–5.80)
RDW: 13.3 % (ref 11.6–15.4)
WBC: 7.9 10*3/uL (ref 3.4–10.8)

## 2019-10-29 LAB — URINALYSIS, ROUTINE W REFLEX MICROSCOPIC
Bilirubin, UA: NEGATIVE
Glucose, UA: NEGATIVE
Ketones, UA: NEGATIVE
Leukocytes,UA: NEGATIVE
Nitrite, UA: NEGATIVE
Protein,UA: NEGATIVE
RBC, UA: NEGATIVE
Specific Gravity, UA: 1.012 (ref 1.005–1.030)
Urobilinogen, Ur: 0.2 mg/dL (ref 0.2–1.0)
pH, UA: 7.5 (ref 5.0–7.5)

## 2019-10-29 LAB — BASIC METABOLIC PANEL
BUN/Creatinine Ratio: 11 (ref 10–24)
BUN: 10 mg/dL (ref 8–27)
CO2: 27 mmol/L (ref 20–29)
Calcium: 9.5 mg/dL (ref 8.6–10.2)
Chloride: 101 mmol/L (ref 96–106)
Creatinine, Ser: 0.94 mg/dL (ref 0.76–1.27)
GFR calc Af Amer: 86 mL/min/{1.73_m2} (ref >59)
GFR calc non Af Amer: 75 mL/min/{1.73_m2} (ref >59)
Glucose: 112 mg/dL — ABNORMAL HIGH (ref 65–99)
Potassium: 4.6 mmol/L (ref 3.5–5.2)
Sodium: 140 mmol/L (ref 134–144)

## 2019-10-29 MED ORDER — AMLODIPINE BESYLATE 5 MG PO TABS
5.0000 mg | ORAL_TABLET | Freq: Every evening | ORAL | 1 refills | Status: DC | PRN
Start: 2019-10-29 — End: 2020-08-20

## 2019-10-29 MED ORDER — NITROGLYCERIN 0.4 MG SL SUBL: 0.4000 mg | SUBLINGUAL_TABLET | SUBLINGUAL | 5 refills | Status: DC | PRN

## 2019-10-29 MED FILL — NITROGLYCERIN 0.4 MG TAB SL: 0.4 | 5 days supply | Qty: 25 | Fill #0

## 2019-10-29 MED FILL — AMLODIPINE BESYLATE 5 MG TA: 5 | 90 days supply | Qty: 90 | Fill #0

## 2019-10-29 NOTE — Patient Instructions (Signed)
Medication Instructions:  Your provider recommends that you continue on your current medications as directed. Please refer to the Current Medication list given to you today.  *If you need a refill on your cardiac medications before your next appointment, please call your pharmacy*  Lab Work: TODAY! BMET, CBC, urinalysis  If you have labs (blood work) drawn today and your tests are completely normal, you will receive your results only by: Marland Kitchen MyChart Message (if you have MyChart) OR . A paper copy in the mail If you have any lab test that is abnormal or we need to change your treatment, we will call you to review the results.  Procedures: Your provider has requested that you have an echocardiogram in 3 months. Echocardiography is a painless test that uses sound waves to create images of your heart. It provides your doctor with information about the size and shape of your heart and how well your heart's chambers and valves are working. This procedure takes approximately one hour. There are no restrictions for this procedure.     Follow-Up: Your provider recommends that you schedule a follow-up appointment in 3 months with Dr. Burt Knack.

## 2019-10-29 NOTE — Progress Notes (Signed)
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  The patient was hospitalized 2 weeks ago with atrial flutter with RVR and non-ST elevation MI.  His medications were adjusted to better control heart rate.  He underwent cardiac catheterization demonstrating progressive disease in the distal RCA and he was treated with a drug-eluting stent platform.  He was discharged on "triple therapy" with aspirin 81 mg daily, clopidogrel, and apixaban.  Since discharge, he remains weak.  He continues to have problems with labile blood pressures.  He takes amlodipine on an as-needed basis, currently taking it most days.  He continues on high-dose metoprolol succinate and is tolerating this reasonably well.  He is very fatigued with any activity and feels weak in his legs with walking even very short distances.  He denies orthopnea or PND.  He has had no recent chest discomfort.  Past Medical History:  Diagnosis Date  . Aortic stenosis    moderate aortic stenosis  . Arthritis   . Benign prostatic hypertrophy   . 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  . Coronary artery disease    status post multiple prior percutaneous coronary  interventions, microvascular angina per Dr Olevia Perches  . Diverticulitis sigmoid colon recurrent   . Heart murmur   . Hypercoagulable state ([place])    chronically anticoagulated with coumadin  . Hyperlipidemia   . Hyperthyroidism   . Hypothyroidism    Dr. Elyse Hsu  . MGUS (monoclonal gammopathy of unknown significance) 02/17/2013  . Paroxysmal atrial fibrillation ([place])    DR. Lia Foyer,   . Prediabetes 09/21/2017  . Stroke [facility])    1990    Past Surgical History:  Procedure Laterality Date  . AORTIC VALVE REPLACEMENT  03/15/2011   Procedure: AORTIC VALVE REPLACEMENT (AVR);  Surgeon: Gaye Pollack, MD;  Location: [place];  Service: Open Heart Surgery;  Laterality: N/A;  . APPENDECTOMY    . CARDIAC CATHETERIZATION     11  . CARDIOVERSION    . CARDIOVERSION  04/15/2011   Procedure: CARDIOVERSION;  Surgeon: Loralie Champagne, MD;  Location: [place];  Service: Cardiovascular;  Laterality: N/A;  . CARDIOVERSION N/A 09/11/2014   Procedure: CARDIOVERSION;  Surgeon: Sueanne Margarita, MD;  Location: [facility] ENDOSCOPY;  Service: Cardiovascular;  Laterality: N/A;  . CARDIOVERSION N/A 06/27/2015   Procedure: CARDIOVERSION;  Surgeon: Thayer Headings, MD;  Location: [facility] ENDOSCOPY;  Service: Cardiovascular;  Laterality: N/A;  . CARDIOVERSION N/A 07/04/2015   Procedure: CARDIOVERSION;  Surgeon: Evans Lance, MD;  Location: [place];  Service: Cardiovascular;  Laterality: N/A;  . CARDIOVERSION N/A 04/13/2017   Procedure: CARDIOVERSION;  Surgeon: Sueanne Margarita, MD;  Location[facility]COPY;  Service: Cardiovascular;  Laterality: N/A;  . COLONOSCOPY    . CORONARY STENT INTERVENTION N/A 10/15/2019  Procedure: CORONARY STENT INTERVENTION;  Surgeon: Jettie Booze, MD;  Location: [place] CV LAB;  Service: Cardiovascular;  Laterality: N/A;  . EP IMPLANTABLE DEVICE N/A 06/16/2015   Procedure: Pacemaker Implant;  Surgeon: Evans Lance, MD;  Location: [place] CV LAB;  Service: Cardiovascular;  Laterality:  N/A;  . hemrrhoidectomy    . LEFT AND RIGHT HEART CATHETERIZATION WITH CORONARY ANGIOGRAM Bilateral 02/01/2011   Procedure: LEFT AND RIGHT HEART CATHETERIZATION WITH CORONARY ANGIOGRAM;  Surgeon: Hillary Bow, MD;  Location: [facility] CATH LAB;  Service: Cardiovascular;  Laterality: Bilateral;  . LEFT HEART CATH AND CORONARY ANGIOGRAPHY N/A 10/15/2019   Procedure: LEFT HEART CATH AND CORONARY ANGIOGRAPHY;  Surgeon: Jettie Booze, MD;  Location: [place] CV LAB;  Service: Cardiovascular;  Laterality: N/A;  . MAZE  03/15/2011   Procedure: MAZE;  Surgeon: Gaye Pollack, MD;  Location: [place];  Service: Open Heart Surgery;  Laterality: N/A;  . PACEMAKER INSERTION  1991   Guidant PPM, most recent Generator Change by Dr Olevia Perches was 08/22/06  . RIGHT/LEFT HEART CATH AND CORONARY ANGIOGRAPHY N/A 07/06/2016   Procedure: Right/Left Heart Cath and Coronary Angiography;  Surgeon: Sherren Mocha, MD;  Location: [place] CV LAB;  Service: Cardiovascular;  Laterality: N/A;  . TEE WITHOUT CARDIOVERSION  04/15/2011   Procedure: TRANSESOPHAGEAL ECHOCARDIOGRAM (TEE);  Surgeon: Loralie Champagne, MD;  Location: [place];  Service: Cardiovascular;  Laterality: N/A;  . TEE WITHOUT CARDIOVERSION N/A 09/11/2014   Procedure: TRANSESOPHAGEAL ECHOCARDIOGRAM (TEE);  Surgeon: Sueanne Margarita, MD;  Location: [facility] ENDOSCOPY;  Service: Cardiovascular;  Laterality: N/A;    Current Medications: Current Meds  Medication Sig  . amLODipine (NORVASC) 5 MG tablet Take 1 tablet (5 mg total) by mouth at bedtime as needed.  Marland Kitchen aspirin EC 81 MG EC tablet Take 1 tablet (81 mg total) by mouth daily. Swallow whole.  . bisacodyl (DULCOLAX) 5 MG EC tablet Take 5 mg by mouth as needed (constipation).   . butalbital-acetaminophen-caffeine (FIORICET, ESGIC) 50-325-40 MG per tablet Take 1 tablet by mouth 2 (two) times daily as needed (osteo-arthritis pain).   . Cholecalciferol (VITAMIN D3) 2000 units capsule Take 1,000 Units by mouth daily.     . clopidogrel (PLAVIX) 75 MG tablet Take 1 tablet (75 mg total) by mouth daily with breakfast.  . Coenzyme Q10 100 MG capsule Take 100 mg by mouth daily.   . Diclofenac Sodium 1.5 % SOLN Take 1 application by mouth 2 (two) times daily as needed (Knee pain).   Marland Kitchen doxazosin (CARDURA) 4 MG tablet Take 4 mg by mouth at bedtime.   Marland Kitchen ELIQUIS 5 MG TABS tablet TAKE 1 TABLET BY MOUTH 2 TIMES DAILY.  Marland Kitchen ezetimibe (ZETIA) 10 MG tablet Take 1 tablet (10 mg total) by mouth daily.  Marland Kitchen HYDROcodone-acetaminophen (NORCO) 10-325 MG tablet TAKE 1 TABLET BY MOUTH TWICE DAILY AS NEEDED FOR MODERATE PAIN  . LORazepam (ATIVAN) 1 MG tablet Take 1 mg by mouth at bedtime.   . magnesium oxide (MAG-OX) 400 MG tablet Take 400 mg by mouth daily.  . metoprolol succinate (TOPROL-XL) 100 MG 24 hr tablet Take 1 tablet (100 mg total) by mouth 2 (two) times daily. Take with or immediately following a meal.  . nitroGLYCERIN (NITROSTAT) 0.4 MG SL tablet PLACE 1 TABLET UNDER THE TONGUE EVERY 5 MINUTES AS NEEDED FOR CHEST PAIN.  Marland Kitchen OVER THE COUNTER MEDICATION Apply 1 patch topically daily as needed (neck and shoulder pain). Chinese pain patch  . pantoprazole (  PROTONIX) 40 MG tablet Take 1 tablet (40 mg total) by mouth daily.  . polyethylene glycol (MIRALAX / GLYCOLAX) 17 g packet Take 17 g by mouth daily.  . potassium chloride SA (KLOR-CON) 20 MEQ tablet Take 20 mEq by mouth daily.  . predniSONE (DELTASONE) 5 MG tablet Take 5 mg by mouth daily with breakfast.  . rosuvastatin (CRESTOR) 20 MG tablet TAKE 1 TABLET BY MOUTH AT BEDTIME.  . [DISCONTINUED] amLODipine (NORVASC) 5 MG tablet Take 5 mg by mouth daily as needed.     Allergies:   Contrast media [iodinated diagnostic agents], Gadolinium derivatives, and Metrizamide   Social History   Socioeconomic History  . Marital status: Married    Spouse name: Not on file  . Number of children: Not on file  . Years of education: Not on file  . Highest education level: Not on file   Occupational History  . Occupation: Retired    Comment: Physician  Tobacco Use  . Smoking status: Never Smoker  . Smokeless tobacco: Never Used  Vaping Use  . Vaping Use: Never used  Substance and Sexual Activity  . Alcohol use: No    Alcohol/week: 0.0 standard drinks  . Drug use: No  . Sexual activity: Not on file  Other Topics Concern  . Not on file  Social History Narrative   Married to [place]. Has grown children   Retired Horticulturist, commercial MD      Never smoker no alcohol      Social Determinants of Radio broadcast assistant Strain:   . Difficulty of Paying Living Expenses: Not on file  Food Insecurity:   . Worried About Charity fundraiser in the Last Year: Not on file  . Ran Out of Food in the Last Year: Not on file  Transportation Needs:   . Lack of Transportation (Medical): Not on file  . Lack of Transportation (Non-Medical): Not on file  Physical Activity:   . Days of Exercise per Week: Not on file  . Minutes of Exercise per Session: Not on file  Stress:   . Feeling of Stress : Not on file  Social Connections:   . Frequency of Communication with Friends and Family: Not on file  . Frequency of Social Gatherings with Friends and Family: Not on file  . Attends Religious Services: Not on file  . Active Member of Clubs or Organizations: Not on file  . Attends Archivist Meetings: Not on file  . Marital Status: Not on file     Family History: The patient's family history includes Heart disease in his brother. There is no history of Anesthesia problems, Hypotension, Malignant hyperthermia, or Pseudochol deficiency.  ROS:   Please see the history of present illness.    All other systems reviewed and are negative.  EKGs/Labs/Other Studies Reviewed:    The following studies were reviewed today: Echo 10/15/2019: IMPRESSIONS    1. Mid and basal inferior wall hypokinesis EF has decreased since echo  done 02/13/19 . Left ventricular ejection fraction, by  estimation, is 45  to 50%. The left ventricle has mildly decreased function. The left  ventricle demonstrates regional wall  motion abnormalities (see scoring diagram/findings for description). Left  ventricular diastolic parameters are indeterminate.  2. Pacing wires in RA/RV. Right ventricular systolic function is mildly  reduced. The right ventricular size is mildly enlarged. There is mildly  elevated pulmonary artery systolic pressure.  3. Left atrial size was moderately dilated.  4. Right atrial size  was moderately dilated.  5. The mitral valve is abnormal. Mild mitral valve regurgitation.  6. Tricuspid valve regurgitation is mild to moderate.  7. Bioprosthetic AVR no details on valve type in chart. No PVL stable  gradients since echo done 02/13/19 . The aortic valve has been  repaired/replaced. Aortic valve regurgitation is not visualized. No aortic  stenosis is present. There is a valve present  in the aortic position. Procedure Date: 03/15/2011.  8. Aortic dilatation noted. There is mild dilatation of the ascending  aorta measuring 40 mm.   Cardiac Cath 10-15-2019: Conclusion    Prox LAD to Mid LAD lesion is 40% stenosed.  Prox RCA lesion is 30% stenosed.  Mid RCA lesion is 40% stenosed.  Previously placed Mid Cx to Dist Cx stent (unknown type) is widely patent.  Dist RCA lesion is 75% stenosed. This was new compared to prior cath in 2018.  A drug-eluting stent was successfully placed using a SYNERGY XD 2.75X16. THis was dilated to 3.0 mm and postdilated to 3.4 mm.  Post intervention, there is a 0% residual stenosis.  There is moderate left ventricular systolic dysfunction.  LV end diastolic pressure is normal.  The left ventricular ejection fraction is 35-45% by visual estimate. Global hypokinesis.   No aspirin given Eliquis and Plavix.  Could use Plavix longer if no bleeding issues, but bare minimum would be one month.    Continue aggressive secondary  prevention and management of his tachycardia.   Diagnostic Dominance: Right Left Main  Vessel is angiographically normal.  Left Anterior Descending  Prox LAD to Mid LAD lesion is 40% stenosed. There is mild diffuse proximal LAD stenosis unchanged from previous study. The LAD wraps around the LV apex  Left Circumflex  Previously placed Mid Cx to Dist Cx stent (unknown type) is widely patent.  Right Coronary Artery  There is mild diffuse disease throughout the vessel. Large, dominant vessel. There are diffuse irregularities with mild stenoses in the proximal and mid vessel. The distal vessel is widely patent. The PDA and PLA branches are patent.  Prox RCA lesion is 30% stenosed. The lesion is calcified.  Mid RCA lesion is 40% stenosed. The lesion is calcified.  Dist RCA lesion is 75% stenosed.  Intervention  Dist RCA lesion  Stent  CATH LAUNCHER 6FR JR4 guide catheter was inserted. Lesion crossed with guidewire using a WIRE ASAHI PROWATER 180CM. Pre-stent angioplasty was performed using a BALLOON SAPPHIRE 2.5X12. A drug-eluting stent was successfully placed using a SYNERGY XD 2.75X16. Stent strut is well apposed. Post-stent angioplasty was performed using a BALLOON SAPPHIRE [place] 3.25X12.  Post-Intervention Lesion Assessment  The intervention was successful. Pre-interventional TIMI flow is 3. Post-intervention TIMI flow is 3. No complications occurred at this lesion.  There is a 0% residual stenosis post intervention.  Wall Motion  Resting               Left Heart  Left Ventricle The left ventricular size is in the upper limits of normal. There is moderate left ventricular systolic dysfunction. LV end diastolic pressure is normal. The left ventricular ejection fraction is 35-45% by visual estimate. There are LV function abnormalities due to global hypokinesis.  Aortic Valve The patient has a bioprosthetic aortic valve that opens and closes well. No gradient across bioprosthetic valve.    Coronary Diagrams  Diagnostic Dominance: Right  Intervention    EKG:  EKG is not ordered today.    Recent Labs: 01/31/2019: NT-Pro BNP 842 10/14/2019: ALT 19;  TSH 2.231 10/15/2019: Magnesium 2.2 10/16/2019: BUN 8; Creatinine, Ser 0.87; Hemoglobin 10.6; Platelets 173; Potassium 3.8; Sodium 140  Recent Lipid Panel    Component Value Date/Time   CHOL 134 10/16/2019 0355   CHOL 158 09/02/2017 1006   TRIG 55 10/16/2019 0355   HDL 78 10/16/2019 0355   HDL 72 09/02/2017 1006   CHOLHDL 1.7 10/16/2019 0355   VLDL 11 10/16/2019 0355   LDLCALC 45 10/16/2019 0355   LDLCALC 68 09/02/2017 1006   LDLDIRECT 117.9 11/16/2010 1355    Physical Exam:    VS:  BP (!) 142/98   Pulse 66   Ht 6\' 1"  (1.854 m)   Wt 222 lb (100.7 kg)   SpO2 97%   BMI 29.29 kg/m     Wt Readings from Last 3 Encounters:  10/29/19 222 lb (100.7 kg)  10/16/19 228 lb 8 oz (103.6 kg)  08/14/19 231 lb (104.8 kg)     GEN:  Well nourished, well developed in no acute distress HEENT: Normal NECK: No JVD; No carotid bruits LYMPHATICS: No lymphadenopathy CARDIAC: irregular, 2/6 SEM at the RUSB RESPIRATORY:  Clear to auscultation without rales, wheezing or rhonchi  ABDOMEN: Soft, non-tender, non-distended MUSCULOSKELETAL:  No edema; No deformity  SKIN: Warm and dry NEUROLOGIC:  Alert and oriented x 3 PSYCHIATRIC:  Normal affect   ASSESSMENT:    1. Hematuria, unspecified type   2. Coronary artery disease involving native coronary artery of native heart with angina pectoris ([place])   3. Orthostatic hypotension   4. Atypical atrial flutter ([place])   5. Chronic diastolic (congestive) heart failure (HCC)   6. Aortic valve disease    PLAN:    In order of problems listed above:  1. Patient on apixaban, aspirin, and clopidogrel.  Check urinalysis, check CBC, discontinue aspirin.  Patient to follow-up closely with urology. 2. Recommend stop aspirin, continue clopidogrel and apixaban.  No angina since hospital  discharge.  Would anticipate discontinuation of clopidogrel at time of 62-month follow-up and transition back to low-dose aspirin. 3. Longstanding problem.  Use amlodipine as needed with episodes of resting hypertension.  Continue metoprolol succinate. 4. Continue anticoagulation with apixaban.  Heart rate now well controlled. 5. Recommend repeat echocardiogram in 3 months since he had new evidence of LV dysfunction during his hospitalization with LVEF 45 to 50%.  I suspect this was related to demand ischemia in the setting of his tachycardia.  I am hopeful that his LV function will return to normal with medical therapy. 6. Normal function of his aortic valve bioprosthesis noted on his recent echo study.   Medication Adjustments/Labs and Tests Ordered: Current medicines are reviewed at length with the patient today.  Concerns regarding medicines are outlined above.  Orders Placed This Encounter  Procedures  . Basic metabolic panel  . CBC with Differential/Platelet  . Urinalysis, Routine w reflex microscopic  . ECHOCARDIOGRAM COMPLETE   Meds ordered this encounter  Medications  . amLODipine (NORVASC) 5 MG tablet    Sig: Take 1 tablet (5 mg total) by mouth at bedtime as needed.    Dispense:  90 tablet    Refill:  1    Patient Instructions  Medication Instructions:  Your provider recommends that you continue on your current medications as directed. Please refer to the Current Medication list given to you today.  *If you need a refill on your cardiac medications before your next appointment, please call your pharmacy*  Lab Work: TODAY! BMET, CBC, urinalysis  If you  have labs (blood work) drawn today and your tests are completely normal, you will receive your results only by: Marland Kitchen MyChart Message (if you have MyChart) OR . A paper copy in the mail If you have any lab test that is abnormal or we need to change your treatment, we will call you to review the results.  Procedures: Your  provider has requested that you have an echocardiogram in 3 months. Echocardiography is a painless test that uses sound waves to create images of your heart. It provides your doctor with information about the size and shape of your heart and how well your heart's chambers and valves are working. This procedure takes approximately one hour. There are no restrictions for this procedure.     Follow-Up: Your provider recommends that you schedule a follow-up appointment in 3 months with Dr. Burt Knack.    Signed, Sherren Mocha, MD  10/29/2019 9:52 AM    [facility]

## 2019-10-30 DIAGNOSIS — R310 Gross hematuria: Secondary | ICD-10-CM | POA: Diagnosis not present

## 2019-10-30 DIAGNOSIS — N401 Enlarged prostate with lower urinary tract symptoms: Secondary | ICD-10-CM | POA: Diagnosis not present

## 2019-10-30 DIAGNOSIS — R3914 Feeling of incomplete bladder emptying: Secondary | ICD-10-CM | POA: Diagnosis not present

## 2019-10-30 MED FILL — LIDOCAINE HCL 2% JELLY: 2 | 12 days supply | Qty: 250 | Fill #0

## 2019-11-01 DIAGNOSIS — Z23 Encounter for immunization: Secondary | ICD-10-CM | POA: Diagnosis not present

## 2019-11-06 ENCOUNTER — Other Ambulatory Visit: Payer: Self-pay

## 2019-11-06 ENCOUNTER — Emergency Department (HOSPITAL_COMMUNITY)
Admission: EM | Admit: 2019-11-06 | Discharge: 2019-11-06 | Disposition: A | Discharge status: Home / Self Care | Payer: Medicare Other | Attending: Emergency Medicine | Admitting: Emergency Medicine

## 2019-11-06 ENCOUNTER — Other Ambulatory Visit (HOSPITAL_COMMUNITY): Payer: Self-pay | Admitting: Endocrinology

## 2019-11-06 ENCOUNTER — Encounter (HOSPITAL_COMMUNITY): Payer: Self-pay | Admitting: Emergency Medicine

## 2019-11-06 ENCOUNTER — Emergency Department (HOSPITAL_COMMUNITY): Payer: Medicare Other

## 2019-11-06 DIAGNOSIS — I4891 Unspecified atrial fibrillation: Secondary | ICD-10-CM | POA: Insufficient documentation

## 2019-11-06 DIAGNOSIS — Z950 Presence of cardiac pacemaker: Secondary | ICD-10-CM | POA: Diagnosis not present

## 2019-11-06 DIAGNOSIS — E039 Hypothyroidism, unspecified: Secondary | ICD-10-CM | POA: Diagnosis not present

## 2019-11-06 DIAGNOSIS — R0789 Other chest pain: Secondary | ICD-10-CM | POA: Diagnosis not present

## 2019-11-06 DIAGNOSIS — Z79899 Other long term (current) drug therapy: Secondary | ICD-10-CM | POA: Insufficient documentation

## 2019-11-06 DIAGNOSIS — Z20822 Contact with and (suspected) exposure to covid-19: Secondary | ICD-10-CM | POA: Insufficient documentation

## 2019-11-06 DIAGNOSIS — I200 Unstable angina: Secondary | ICD-10-CM | POA: Diagnosis not present

## 2019-11-06 DIAGNOSIS — I2510 Atherosclerotic heart disease of native coronary artery without angina pectoris: Secondary | ICD-10-CM | POA: Insufficient documentation

## 2019-11-06 DIAGNOSIS — I471 Supraventricular tachycardia: Secondary | ICD-10-CM

## 2019-11-06 DIAGNOSIS — I201 Angina pectoris with documented spasm: Secondary | ICD-10-CM | POA: Diagnosis not present

## 2019-11-06 DIAGNOSIS — N390 Urinary tract infection, site not specified: Secondary | ICD-10-CM

## 2019-11-06 DIAGNOSIS — R079 Chest pain, unspecified: Secondary | ICD-10-CM | POA: Diagnosis not present

## 2019-11-06 DIAGNOSIS — I499 Cardiac arrhythmia, unspecified: Secondary | ICD-10-CM | POA: Diagnosis not present

## 2019-11-06 LAB — URINALYSIS, ROUTINE W REFLEX MICROSCOPIC
Bilirubin Urine: NEGATIVE
Glucose, UA: NEGATIVE mg/dL
Hgb urine dipstick: NEGATIVE
Ketones, ur: NEGATIVE mg/dL
Nitrite: NEGATIVE
Protein, ur: NEGATIVE mg/dL
Specific Gravity, Urine: 1.017 (ref 1.005–1.030)
WBC, UA: >50 WBC/hpf — ABNORMAL HIGH (ref 0–5)
pH: 6 (ref 5.0–8.0)

## 2019-11-06 LAB — BASIC METABOLIC PANEL
Anion gap: 11 (ref 5–15)
BUN: 13 mg/dL (ref 8–23)
CO2: 26 mmol/L (ref 22–32)
Calcium: 8.7 mg/dL — ABNORMAL LOW (ref 8.9–10.3)
Chloride: 99 mmol/L (ref 98–111)
Creatinine, Ser: 0.99 mg/dL (ref 0.61–1.24)
GFR calc Af Amer: >60 mL/min (ref >60)
GFR calc non Af Amer: >60 mL/min (ref >60)
Glucose, Bld: 169 mg/dL — ABNORMAL HIGH (ref 70–99)
Potassium: 3.6 mmol/L (ref 3.5–5.1)
Sodium: 136 mmol/L (ref 135–145)

## 2019-11-06 LAB — TROPONIN I (HIGH SENSITIVITY)
Troponin I (High Sensitivity): 36 ng/L — ABNORMAL HIGH (ref <18)
Troponin I (High Sensitivity): 96 ng/L — ABNORMAL HIGH (ref <18)
Troponin I (High Sensitivity): 172 ng/L (ref <18)
Troponin I (High Sensitivity): 231 ng/L (ref <18)

## 2019-11-06 LAB — SARS CORONAVIRUS 2 BY RT PCR (HOSPITAL ORDER, PERFORMED IN ~~LOC~~ HOSPITAL LAB): SARS Coronavirus 2: NEGATIVE

## 2019-11-06 LAB — CBC
HCT: 34.3 % — ABNORMAL LOW (ref 39.0–52.0)
Hemoglobin: 10.8 g/dL — ABNORMAL LOW (ref 13.0–17.0)
MCH: 28.3 pg (ref 26.0–34.0)
MCHC: 31.5 g/dL (ref 30.0–36.0)
MCV: 89.8 fL (ref 80.0–100.0)
Platelets: 180 10*3/uL (ref 150–400)
RBC: 3.82 MIL/uL — ABNORMAL LOW (ref 4.22–5.81)
RDW: 14.5 % (ref 11.5–15.5)
WBC: 7.7 10*3/uL (ref 4.0–10.5)
nRBC: 0 % (ref 0.0–0.2)

## 2019-11-06 MED ORDER — CEPHALEXIN 250 MG PO CAPS
500.0000 mg | ORAL_CAPSULE | Freq: Once | ORAL | Status: AC
  Administered 2019-11-06: 500 mg via ORAL
  Filled 2019-11-06: qty 2

## 2019-11-06 MED ORDER — METOPROLOL SUCCINATE ER 100 MG PO TB24
100.0000 mg | ORAL_TABLET | Freq: Two times a day (BID) | ORAL | Status: DC
  Administered 2019-11-06: 100 mg via ORAL
  Filled 2019-11-06: qty 1

## 2019-11-06 MED ORDER — HYDROCODONE-ACETAMINOPHEN 5-325 MG PO TABS
2.0000 | ORAL_TABLET | Freq: Once | ORAL | Status: AC
  Administered 2019-11-06: 2 via ORAL
  Filled 2019-11-06: qty 2

## 2019-11-06 MED ORDER — APIXABAN 5 MG PO TABS
5.0000 mg | ORAL_TABLET | Freq: Two times a day (BID) | ORAL | Status: DC
  Administered 2019-11-06: 5 mg via ORAL
  Filled 2019-11-06: qty 1

## 2019-11-06 MED ORDER — CEPHALEXIN 500 MG PO CAPS: 500.0000 mg | ORAL_CAPSULE | Freq: Three times a day (TID) | ORAL | 0 refills | Status: AC

## 2019-11-06 MED ORDER — DILTIAZEM HCL 30 MG PO TABS: 30.0000 mg | ORAL_TABLET | Freq: Four times a day (QID) | ORAL | 11 refills | Status: DC | PRN

## 2019-11-06 MED ORDER — CLOPIDOGREL BISULFATE 75 MG PO TABS
75.0000 mg | ORAL_TABLET | Freq: Every day | ORAL | Status: DC
  Administered 2019-11-06: 75 mg via ORAL
  Filled 2019-11-06: qty 1

## 2019-11-06 MED ORDER — CLOPIDOGREL BISULFATE 75 MG PO TABS: 75.0000 mg | ORAL_TABLET | Freq: Every day | ORAL | Status: DC

## 2019-11-06 MED FILL — CEPHALEXIN 500 MG CAPSULE: 500 | 7 days supply | Qty: 21 | Fill #0

## 2019-11-06 MED FILL — dilTIAZem HCL 30 MG TABS: 30 | 22 days supply | Qty: 90 | Fill #0

## 2019-11-06 NOTE — Consult Note (Addendum)
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  Admit date: 11/06/2019 Date of Consult: 11/06/2019  Primary Physician: [name], MD Primary Cardiologist: Sherren Mocha, MD  Electrophysiologist: Dr. Lovena Le  Referring Provider: Dr. 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 Billy Fischer.  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 He was feeling well until evening of 9/6 when he became flushed, diaphoertic with generalized muscle aches. He then noticed a rapid HR and tightness in his chest. He took an additional 25 mg of Lopressor, and called his RN. She documented HR of 187 upon her arrival, gave him an additional 100 mg of Toprol and called EMS.     Pt was given 10 mg IV cardizem x 2 by EMS with rate control and resolution of chest pain. Denied shortness of breath at any point.   Currently he is feeling OK. His nurse is present who states he had overall been doing well on Toprol 100 mg BID.  Pt has been having issues with urinary retention. He attempted self cathing but this resulted in hematuria and he has stopped.  UA positive with Hazy appearance, moderate Leukocytes, > WBC, and rare bacteria with mild dysuria for the past few weeks. Plans to start ABX per ED.   Past Medical History:  Diagnosis Date  . Aortic stenosis    moderate aortic stenosis  . Arthritis   . Benign prostatic hypertrophy   . Chronotropic incompetence with sinus node dysfunction (HCC)    Status post Guidant dual-mode, dual-pacing,  dual-sensing  pacemaker   implantation now programmed to AAI with recent generator change.  . Coronary artery disease    status post multiple prior percutaneous coronary interventions, microvascular angina per Dr Olevia Perches  . Diverticulitis sigmoid colon recurrent   . Heart murmur   . Hypercoagulable state ([place])    chronically anticoagulated with coumadin  . Hyperlipidemia   . Hyperthyroidism   . Hypothyroidism    Dr. Elyse Hsu  . MGUS (monoclonal gammopathy of unknown significance) 02/17/2013  . Paroxysmal atrial fibrillation ([place])    DR. Lia Foyer,   . Prediabetes 09/21/2017  . Stroke [facility])    1990     Surgical History:  Past Surgical History:  Procedure Laterality Date  . AORTIC VALVE REPLACEMENT  03/15/2011   Procedure: AORTIC VALVE REPLACEMENT (AVR);  Surgeon: Gaye Pollack, MD;  Location: [place];  Service: Open Heart Surgery;  Laterality: N/A;  . APPENDECTOMY    . CARDIAC CATHETERIZATION     11  . CARDIOVERSION    . CARDIOVERSION  04/15/2011   Procedure: CARDIOVERSION;  Surgeon: Loralie Champagne, MD;  Location: [place];  Service: Cardiovascular;  Laterality: N/A;  . CARDIOVERSION N/A 09/11/2014   Procedure: CARDIOVERSION;  Surgeon: Sueanne Margarita, MD;  Location: [facility] ENDOSCOPY;  Service: Cardiovascular;  Laterality: N/A;  . CARDIOVERSION N/A 06/27/2015   Procedure: CARDIOVERSION;  Surgeon: Thayer Headings, MD;  Location: [place];  Service: Cardiovascular;  Laterality: N/A;  . CARDIOVERSION  N/A 07/04/2015   Procedure: CARDIOVERSION;  Surgeon: Evans Lance, MD;  Location: [place];  Service: Cardiovascular;  Laterality: N/A;  . CARDIOVERSION N/A 04/13/2017   Procedure: CARDIOVERSION;  Surgeon: Sueanne Margarita, MD;  Location: [facility] ENDOSCOPY;  Service: Cardiovascular;  Laterality: N/A;  . COLONOSCOPY    . CORONARY STENT INTERVENTION N/A 10/15/2019   Procedure: CORONARY STENT INTERVENTION;  Surgeon: Jettie Booze, MD;  Location: [place] CV LAB;  Service: Cardiovascular;   Laterality: N/A;  . EP IMPLANTABLE DEVICE N/A 06/16/2015   Procedure: Pacemaker Implant;  Surgeon: Evans Lance, MD;  Location: [place] CV LAB;  Service: Cardiovascular;  Laterality: N/A;  . hemrrhoidectomy    . LEFT AND RIGHT HEART CATHETERIZATION WITH CORONARY ANGIOGRAM Bilateral 02/01/2011   Procedure: LEFT AND RIGHT HEART CATHETERIZATION WITH CORONARY ANGIOGRAM;  Surgeon: Hillary Bow, MD;  Location: [facility] CATH LAB;  Service: Cardiovascular;  Laterality: Bilateral;  . LEFT HEART CATH AND CORONARY ANGIOGRAPHY N/A 10/15/2019   Procedure: LEFT HEART CATH AND CORONARY ANGIOGRAPHY;  Surgeon: Jettie Booze, MD;  Location: [place] CV LAB;  Service: Cardiovascular;  Laterality: N/A;  . MAZE  03/15/2011   Procedure: MAZE;  Surgeon: Gaye Pollack, MD;  Location: [place];  Service: Open Heart Surgery;  Laterality: N/A;  . PACEMAKER INSERTION  1991   Guidant PPM, most recent Generator Change by Dr Olevia Perches was 08/22/06  . RIGHT/LEFT HEART CATH AND CORONARY ANGIOGRAPHY N/A 07/06/2016   Procedure: Right/Left Heart Cath and Coronary Angiography;  Surgeon: Sherren Mocha, MD;  Location: [place] CV LAB;  Service: Cardiovascular;  Laterality: N/A;  . TEE WITHOUT CARDIOVERSION  04/15/2011   Procedure: TRANSESOPHAGEAL ECHOCARDIOGRAM (TEE);  Surgeon: Loralie Champagne, MD;  Location: [place];  Service: Cardiovascular;  Laterality: N/A;  . TEE WITHOUT CARDIOVERSION N/A 09/11/2014   Procedure: TRANSESOPHAGEAL ECHOCARDIOGRAM (TEE);  Surgeon: Sueanne Margarita, MD;  Location: [facility] ENDOSCOPY;  Service: Cardiovascular;  Laterality: N/A;     (Not in a hospital admission)   Inpatient Medications:   Allergies:  Allergies  Allergen Reactions  . Contrast Media [Iodinated Diagnostic Agents] Hives  . Gadolinium Derivatives Hives  . Metrizamide Hives    Social History   Socioeconomic History  . Marital status: Married    Spouse name: Not on file  . Number of children: Not on file  . Years of  education: Not on file  . Highest education level: Not on file  Occupational History  . Occupation: Retired    Comment: Physician  Tobacco Use  . Smoking status: Never Smoker  . Smokeless tobacco: Never Used  Vaping Use  . Vaping Use: Never used  Substance and Sexual Activity  . Alcohol use: No    Alcohol/week: 0.0 standard drinks  . Drug use: No  . Sexual activity: Not on file  Other Topics Concern  . Not on file  Social History Narrative   Married to Serena. Has grown children   Retired Horticulturist, commercial MD      Never smoker no alcohol      Social Determinants of Radio broadcast assistant Strain:   . Difficulty of Paying Living Expenses: Not on file  Food Insecurity:   . Worried About Charity fundraiser in the Last Year: Not on file  . Ran Out of Food in the Last Year: Not on file  Transportation Needs:   . Lack of Transportation (Medical): Not on file  . Lack of Transportation (Non-Medical): Not on file  Physical Activity:   . Days of Exercise per Week: Not on file  . Minutes of Exercise per Session: Not on file  Stress:   . Feeling of Stress : Not on file  Social Connections:   . Frequency of Communication with Friends and Family: Not on file  . Frequency of Social Gatherings with Friends and Family: Not on file  . Attends Religious Services: Not on file  . Active Member of Clubs or Organizations: Not on file  . Attends Archivist Meetings: Not on file  . Marital Status: Not on file  Intimate Partner Violence:   . Fear of Current or Ex-Partner: Not on file  . Emotionally Abused: Not on file  . Physically Abused: Not on file  . Sexually Abused: Not on file     Family History  Problem Relation Age of Onset  . Heart disease Brother        Twin brother has coronary disease and recent AVR for AS  . Anesthesia problems Neg Hx   . Hypotension Neg Hx   . Malignant hyperthermia Neg Hx   . Pseudochol deficiency Neg Hx      Review of Systems: All other  systems reviewed and are otherwise negative except as noted above.  Physical Exam: Vitals:   11/06/19 0715 11/06/19 0730 11/06/19 0745 11/06/19 0800  BP: (!) 157/86 (!) 166/100 (!) 153/100 (!) 167/94  Pulse: 86 79 92 78  Resp: 18 14 18 17   Temp:      TempSrc:      SpO2: 96% 96% 96% 96%  Weight:      Height:        GEN- The patient is well appearing, alert and oriented x 3 today.   HEENT: normocephalic, atraumatic; sclera clear, conjunctiva pink; hearing intact; oropharynx clear; neck supple Lungs- Clear to ausculation bilaterally, normal work of breathing.  No wheezes, rales, rhonchi Heart- Regular rate and rhythm, no murmurs, rubs or gallops GI- soft, non-tender, non-distended, bowel sounds present Extremities- no clubbing, cyanosis, or edema; DP/PT/radial pulses 2+ bilaterally MS- no significant deformity or atrophy Skin- warm and dry, no rash or lesion Psych- euthymic mood, full affect Neuro- strength and sensation are intact  Labs:   Lab Results  Component Value Date   WBC 7.7 11/06/2019   HGB 10.8 (L) 11/06/2019   HCT 34.3 (L) 11/06/2019   MCV 89.8 11/06/2019   PLT 180 11/06/2019    Recent Labs  Lab 11/06/19 0238  NA 136  K 3.6  CL 99  CO2 26  BUN 13  CREATININE 0.99  CALCIUM 8.7*  GLUCOSE 169*      Radiology/Studies: CT ABDOMEN PELVIS WO CONTRAST  Result Date: 10/18/2019 CLINICAL DATA:  Worsening left lower quadrant pain for several months. EXAM: CT ABDOMEN AND PELVIS WITHOUT CONTRAST TECHNIQUE: Multidetector CT imaging of the abdomen and pelvis was performed following the standard protocol without IV contrast. COMPARISON:  09/12/2018 from [place]: Lower chest: No acute findings. Hepatobiliary: No mass visualized on this unenhanced exam. Numerous tiny gallstones are again seen, however there is no evidence of cholecystitis or biliary ductal dilatation. Pancreas: No mass or inflammatory process visualized on this unenhanced exam. Spleen:   Within normal limits in size. Adrenals/Urinary tract: 4 cm fluid attenuation subcapsular cysts again seen in the lower pole of the right kidney. No evidence of urolithiasis or hydronephrosis. Stable mild diffuse bladder wall thickening is again seen, likely due to chronic bladder outlet obstruction given enlarged prostate. Stomach/Bowel:  Mild-to-moderate acute diverticulitis is seen involving the proximal sigmoid colon. No evidence of extraluminal air or abscess. Vascular/Lymphatic: No pathologically enlarged lymph nodes identified. No evidence of abdominal aortic aneurysm. Aortic atherosclerosis noted. Reproductive:  Stable mild-to-moderate enlargement of prostate. Other:  None. Musculoskeletal:  No suspicious bone lesions identified. IMPRESSION: Mild to moderate acute sigmoid diverticulitis. No evidence of abscess or other complication. Stable enlarged prostate and findings of chronic bladder outlet obstruction. Cholelithiasis. No radiographic evidence of cholecystitis. Aortic Atherosclerosis (ICD10-I70.0). Electronically Signed   By: Marlaine Hind M.D.   On: 10/18/2019 14:32   DG Abd 1 View  Result Date: 10/14/2019 CLINICAL DATA:  Constipation. EXAM: ABDOMEN - 1 VIEW COMPARISON:  CT 09/12/2018 FINDINGS: No bowel dilatation to suggest obstruction. There is moderate stool in the right colon, with small volume of stool throughout the remainder of the colon. No abnormal rectal distention. Pelvic calcifications consistent phleboliths. There also vascular calcifications. No acute osseous abnormalities are seen. IMPRESSION: Moderate stool in the right colon, small volume of stool distally. No abnormal rectal distention. Normal bowel gas pattern without obstruction. Electronically Signed   By: Keith Rake M.D.   On: 10/14/2019 01:30   CARDIAC CATHETERIZATION  Result Date: 10/15/2019  Prox LAD to Mid LAD lesion is 40% stenosed.  Prox RCA lesion is 30% stenosed.  Mid RCA lesion is 40% stenosed.  Previously  placed Mid Cx to Dist Cx stent (unknown type) is widely patent.  Dist RCA lesion is 75% stenosed. This was new compared to prior cath in 2018.  A drug-eluting stent was successfully placed using a SYNERGY XD [id number]. THis was dilated to 3.0 mm and postdilated to 3.4 mm.  Post intervention, there is a 0% residual stenosis.  There is moderate left ventricular systolic dysfunction.  LV end diastolic pressure is normal.  The left ventricular ejection fraction is 35-45% by visual estimate. Global hypokinesis.  No aspirin given Eliquis and Plavix.  Could use Plavix longer if no bleeding issues, but bare minimum would be one month.  Continue aggressive secondary prevention and management of his tachycardia.   DG Chest Port 1 View  Result Date: 11/06/2019 CLINICAL DATA:  Chest pain EXAM: PORTABLE CHEST 1 VIEW COMPARISON:  10/13/2019 FINDINGS: The heart size and mediastinal contours are within normal limits. Both lungs are clear. The visualized skeletal structures are unremarkable. Median sternotomy, prosthetic aortic valve and left atrial appendage clip. Bilateral pacemakers. IMPRESSION: No active disease. Electronically Signed   By: Ulyses Jarred M.D.   On: 11/06/2019 02:59   DG Chest Portable 1 View  Result Date: 10/13/2019 CLINICAL DATA:  Supraventricular tachycardia. EXAM: PORTABLE CHEST 1 VIEW COMPARISON:  Chest radiograph dated 02/15/2018. FINDINGS: The heart size is mildly enlarged, unchanged. Both lungs are clear. The visualized skeletal structures are unremarkable. Bilateral subclavian cardiac devices are redemonstrated. Median sternotomy wires, an aortic valve prosthesis, and an atrial appendage clip are redemonstrated. IMPRESSION: No active disease. Electronically Signed   By: Zerita Boers M.D.   On: 10/13/2019 13:20   ECHOCARDIOGRAM COMPLETE  Result Date: 10/15/2019    ECHOCARDIOGRAM REPORT   Patient Name:   JAIEL SARACENO Date of Exam: 10/15/2019 Medical Rec #:  [id number]        Height:        73.0 in Accession #:    [id number]       Weight:       220.9 lb Date of Birth:  [birth year]         BSA:  2.244 m Patient Age:    61 years         BP:           146/90 mmHg Patient Gender: M                HR:           77 bpm. Exam Location:  Inpatient Procedure: 2D Echo Indications:    Chest Pain R07.9  History:        Patient has prior history of Echocardiogram examinations, most                 recent 02/13/2019. CAD, Pacemaker, Aortic Valve Disease,                 Arrythmias:Atrial Fibrillation; Risk Factors:Dyslipidemia.                 Aortic Valve: valve is present in the aortic position. Procedure                 Date: 03/15/2011.  Sonographer:    Mikki Santee RDCS (AE) Referring Phys: [place]  1. Mid and basal inferior wall hypokinesis EF has decreased since echo done 02/13/19 . Left ventricular ejection fraction, by estimation, is 45 to 50%. The left ventricle has mildly decreased function. The left ventricle demonstrates regional wall motion abnormalities (see scoring diagram/findings for description). Left ventricular diastolic parameters are indeterminate.  2. Pacing wires in RA/RV. Right ventricular systolic function is mildly reduced. The right ventricular size is mildly enlarged. There is mildly elevated pulmonary artery systolic pressure.  3. Left atrial size was moderately dilated.  4. Right atrial size was moderately dilated.  5. The mitral valve is abnormal. Mild mitral valve regurgitation.  6. Tricuspid valve regurgitation is mild to moderate.  7. Bioprosthetic AVR no details on valve type in chart. No PVL stable gradients since echo done 02/13/19 . The aortic valve has been repaired/replaced. Aortic valve regurgitation is not visualized. No aortic stenosis is present. There is a valve present  in the aortic position. Procedure Date: 03/15/2011.  8. Aortic dilatation noted. There is mild dilatation of the ascending aorta measuring 40 mm. FINDINGS  Left  Ventricle: Mid and basal inferior wall hypokinesis EF has decreased since echo done 02/13/19. Left ventricular ejection fraction, by estimation, is 45 to 50%. The left ventricle has mildly decreased function. The left ventricle demonstrates regional wall motion abnormalities. The left ventricular internal cavity size was normal in size. There is no left ventricular hypertrophy. Left ventricular diastolic parameters are indeterminate. Right Ventricle: Pacing wires in RA/RV. The right ventricular size is mildly enlarged. Right vetricular wall thickness was not assessed. Right ventricular systolic function is mildly reduced. There is mildly elevated pulmonary artery systolic pressure. The tricuspid regurgitant velocity is 2.79 m/s, and with an assumed right atrial pressure of 8 mmHg, the estimated right ventricular systolic pressure is 24.4 mmHg. Left Atrium: Left atrial size was moderately dilated. Right Atrium: Right atrial size was moderately dilated. Pericardium: There is no evidence of pericardial effusion. Mitral Valve: The mitral valve is abnormal. There is mild thickening of the mitral valve leaflet(s). There is mild calcification of the mitral valve leaflet(s). Severe mitral annular calcification. Mild mitral valve regurgitation. Tricuspid Valve: The tricuspid valve is normal in structure. Tricuspid valve regurgitation is mild to moderate. Aortic Valve: Bioprosthetic AVR no details on valve type in chart. No PVL stable gradients since echo done 02/13/19. The aortic valve has been  repaired/replaced. Aortic valve regurgitation is not visualized. No aortic stenosis is present. Aortic valve mean gradient measures 7.0 mmHg. Aortic valve peak gradient measures 12.4 mmHg. Aortic valve area, by VTI measures 1.42 cm. There is a valve present in the aortic position. Procedure Date: 03/15/2011. Pulmonic Valve: The pulmonic valve was normal in structure. Pulmonic valve regurgitation is mild. Aorta: Aortic dilatation  noted. There is mild dilatation of the ascending aorta measuring 40 mm. IAS/Shunts: The interatrial septum was not well visualized.  LEFT VENTRICLE PLAX 2D LVIDd:         6.00 cm  Diastology LVIDs:         4.20 cm  LV e' lateral: 8.92 cm/s LV PW:         1.10 cm  LV e' medial:  4.54 cm/s LV IVS:        1.00 cm LVOT diam:     2.30 cm LV SV:         55 LV SV Index:   25 LVOT Area:     4.15 cm  RIGHT VENTRICLE RV S prime:     10.60 cm/s TAPSE (M-mode): 1.2 cm LEFT ATRIUM              Index       RIGHT ATRIUM           Index LA diam:        5.60 cm  2.50 cm/m  RA Area:     20.90 cm LA Vol (A2C):   114.0 ml 50.79 ml/m RA Volume:   56.20 ml  25.04 ml/m LA Vol (A4C):   120.0 ml 53.46 ml/m LA Biplane Vol: 122.0 ml 54.36 ml/m  AORTIC VALVE AV Area (Vmax):    1.53 cm AV Area (Vmean):   1.42 cm AV Area (VTI):     1.42 cm AV Vmax:           176.00 cm/s AV Vmean:          131.000 cm/s AV VTI:            0.389 m AV Peak Grad:      12.4 mmHg AV Mean Grad:      7.0 mmHg LVOT Vmax:         64.90 cm/s LVOT Vmean:        44.800 cm/s LVOT VTI:          0.133 m LVOT/AV VTI ratio: 0.34  AORTA Ao Root diam: 3.60 cm TRICUSPID VALVE TR Peak grad:   31.1 mmHg TR Vmax:        279.00 cm/s  SHUNTS Systemic VTI:  0.13 m Systemic Diam: 2.30 cm Jenkins Rouge MD Electronically signed by Jenkins Rouge MD Signature Date/Time: 10/15/2019/3:08:20 PM    Final     EKG: EMS strips show AF with RVR in 100-110s (personally reviewed)  EKG on arrival shows AF with controlled VR at 89 bpm (personally reviewed)      TELEMETRY: AF 60-90s since being attached to monitor (personally reviewed)  DEVICE HISTORY: [employer]   Assessment/Plan: 1.  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  Rates have been controlled since arrival after IV diltiazem via EMS.  He is on Toprol 100 mg BID.  Discussed with Dr. Rayann Heman and  will also prescribe PRN short acting diltiazem to use as outpatient.  Continue eliquis for CHA2DS2VASC of at least  4   He has scheduled follow up with Dr. Lovena Le 12/18/2019. Previously failed amiodarone with lung toxicity  2. HTN Uses amlodipine prn with episodes of resting HTN  3. Chronic systolic CHF Echo 0/17/7939 LVEF 45-50%  Volume status stable on exam.   4. CAD Pt had stenting of RCA 10/15/2019 He had chest tightness last night with RVR, but no further.  HS troponin with gradual uptrend over past 6 hrs.  36 -> 96 -> 172 -> 231 -> pending.  Dr. Burt Knack to further evaluate.   5. UTI With recent urinary retention.  Has attempted in and out caths but led to hematuria on Eliquis.  UCx sent.  Plan to start ABX per ED MD.   For questions or updates, please contact [place] Please consult [email or web address] for contact info under Cardiology/STEMI.  Signed, Shirley Friar, PA-C  11/06/2019 10:20 AM  I have seen, examined the patient, and reviewed the above assessment and plan.  Changes to above are made where necessary.  On exam, iRRR.  The patient has longstanding symptomatic atrial arrhythmias.  He is well known to Dr Lovena Le.  Dr Lovena Le and I have discussed at length.  Per Dr Lovena Le, I will continue metoprolol 100mg  BID.  In addition, I will give cardizem 30mg  Q4h prn for breakthrough RVR. He has scheduled follow-up with Dr Lovena Le.  Co Sign: Thompson Grayer, MD 11/06/2019 6:11 PM

## 2019-11-06 NOTE — Progress Notes (Signed)
Cardiology Note: Pt well-known to me. In ER with AF with RVR (permanent atypical flutter). This is in setting of a catheter induced UTI (recent trial of self cath). See full EP consult note for details. Pt looks good on my evaluation, now with HR 75 bpm after receiving diltiazem. He has mild troponin elevation consistent with mild demand ischemia. I do not think he has any evidence of ACS. Agree with use of prn cardizem. I will arrange close follow-up with him later this month. His nurse, Maudie Mercury, is at the bedside and we discussed follow-up plans and recommendations. Also spoke with EDP who will discharge him from the ER.  Sherren Mocha 11/06/2019 4:53 PM

## 2019-11-06 NOTE — ED Provider Notes (Signed)
  Physical Exam  BP 129/71 (BP Location: Right Arm)   Pulse 69   Temp 98.3 F (36.8 C) (Oral)   Resp 16   Ht 6\' 2"  (1.88 m)   Wt 109 kg   SpO2 95%   BMI 30.85 kg/m   Physical Exam  ED Course/Procedures     Procedures  MDM  Received care of Dr. Velora Heckler from Dr. Betsey Holiday at [facility]. Please see his note for prior history and physical.  Presents with concern for palpitations, chest tightness in setting being in atrial fibrillation with RVR witih rate in 180s which was rate controlled after IV diltiazem with EMS and po metoprolol at home.  This episode was preceded by feeling of body aches, fatigue, chills.  He received the third COVID vaccine Thursday but did not have any of these symptoms until yesterday.  Troponins trending up--dlan to obtain third troponin and discuss with Cardiology.  Third troponin with continued increase however likely within realm of atrial fibrillation RVR for hours prior to arrival. No continued chest pain or dyspnea. Discussed with Dr. Gardiner Rhyme of Cardiology who recommends repeat troponin and have Cardiology team evaluate.  Dr. Rayann Heman and Dr. Burt Knack of Cardiology evaluated him and feel outpatient therapy is appropriate with close follow up and prescription for prn diltiazem.  History of urinary retention 500cc and self-cath was prescribed but discontinued due to associated bleeding.  No sign of renal dysfunction on labs. Urinalysis with greater than 50 WBC is concerning for UTI and suspect this is underlying etiology of episode of afib with RVR. Given keflex in ED and rx for same.      Gareth Morgan, MD 11/06/19 2224

## 2019-11-06 NOTE — ED Provider Notes (Signed)
[facility] EMERGENCY DEPARTMENT Provider Note   CSN: [id number] Arrival date & time: 11/06/19  0208     History Chief Complaint  Patient presents with  . 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  Patient presents to the emergency department for evaluation of atrial fibrillation.  Patient has a history of chronic atrial fibrillation.  He has had periodic episodes of rapid ventricular response in the past.  He reports that he has undergone every type of cardioversion attempt in the past and has not been able to stay in sinus rhythm.  He had his Covid vaccination booster several days ago.  He was feeling well until tonight when he suddenly became flushed, diaphoretic with generalized aching myalgias.  He then noticed a rapid heart rate with tightness in the chest.  He took an additional Lopressor 25 mg and called his nurse.  She documented a heart rate of 187 upon her arrival, gave him an additional Toprol-XL 100 mg and called EMS.  Patient presents to the emergency department by ambulance.  He was still experiencing rapid ventricular response when EMS arrived.  He was given Cardizem 10 mg IV push x2 and has achieved rate control.  He no longer has any chest discomfort.  He never had any shortness of breath.        Past Medical History:  Diagnosis Date  . Aortic stenosis    moderate aortic stenosis  . Arthritis   . Benign prostatic hypertrophy   . 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  . Coronary artery disease    status post multiple prior percutaneous coronary interventions, microvascular angina per Dr Olevia Perches  . Diverticulitis sigmoid colon recurrent   . Heart murmur   . Hypercoagulable state ([place])    chronically anticoagulated with coumadin  . Hyperlipidemia   .  Hyperthyroidism   . Hypothyroidism    Dr. Elyse Hsu  . MGUS (monoclonal gammopathy of unknown significance) 02/17/2013  . Paroxysmal atrial fibrillation ([place])    DR. Lia Foyer,   . Prediabetes 09/21/2017  . Stroke [facility])    1990    Patient Active Problem List   Diagnosis Date Noted  . A-fib ([place]) 10/14/2019  . Atrial fibrillation with rapid ventricular response ([place]) 10/13/2019  . NSTEMI (non-ST elevated myocardial infarction) ([place]) 10/13/2019  . Acute diarrhea 10/13/2019  . Normocytic anemia 10/13/2019  . Diverticulitis sigmoid colon recurrent   . Prediabetes 09/21/2017  . Persistent atrial fibrillation ([place]) 04/11/2017  . Vitamin D deficiency 05/20/2016  . History of pacemaker 05/20/2016  . History of stroke/[place]  05/20/2016  . Primary osteoarthritis of both hands 03/10/2016  . DDD cervical spine 03/10/2016  . 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 Chronic left shoulder pain 03/10/2016  . Trochanteric bursitis of right hip 03/10/2016  . Other fatigue 03/10/2016  . Myalgia 03/10/2016  . Chronic pain syndrome 03/10/2016  . 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 Typical atrial flutter ([place])   . PVC's (premature ventricular contractions) 01/30/2015  . Pacemaker 04/30/2013  . MGUS (monoclonal gammopathy of unknown significance) 02/17/2013  . Orthostatic hypotension 04/11/2011  . Long term current use of anticoagulant therapy 03/24/2011  . S/P AVR 03/24/2011  . Pleural effusion 03/24/2011  . Hypothyroidism 11/22/2010  . Atrial flutter ([place]) 08/19/2010  . CHEST PAIN 04/06/2010  .  Mixed hyperlipidemia 12/08/2007  . PRIMARY HYPERCOAGULABLE STATE 12/08/2007  . Coronary artery disease with exertional angina ([place]) 12/08/2007  . Coronary artery disease involving native coronary artery of native heart 12/08/2007  . AORTIC STENOSIS/ INSUFFICIENCY, NON-RHEUMATIC 12/08/2007  . Atrial fibrillation ([place]) 12/08/2007  . 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 AORTIC VALVE REPLACEMENT  03/15/2011   Procedure: AORTIC VALVE REPLACEMENT (AVR);  Surgeon: Gaye Pollack, MD;  Location: [place];  Service: Open Heart Surgery;  Laterality: N/A;  . APPENDECTOMY    . CARDIAC CATHETERIZATION     11  . CARDIOVERSION    . CARDIOVERSION  04/15/2011   Procedure: CARDIOVERSION;  Surgeon: Loralie Champagne, MD;  Location: [place];  Service: Cardiovascular;  Laterality: N/A;  . CARDIOVERSION N/A 09/11/2014   Procedure: CARDIOVERSION;  Surgeon: Sueanne Margarita, MD;  Location: [facility] ENDOSCOPY;  Service: Cardiovascular;  Laterality: N/A;  . CARDIOVERSION N/A 06/27/2015   Procedure: CARDIOVERSION;  Surgeon: Thayer Headings, MD;  Location: [facility] ENDOSCOPY;  Service: Cardiovascular;  Laterality: N/A;  . CARDIOVERSION N/A 07/04/2015   Procedure: CARDIOVERSION;  Surgeon: Evans Lance, MD;  Location: [place];  Service: Cardiovascular;  Laterality: N/A;  . CARDIOVERSION N/A 04/13/2017   Procedure: CARDIOVERSION;  Surgeon: Sueanne Margarita, MD;  Location: [facility] ENDOSCOPY;  Service: Cardiovascular;  Laterality: N/A;  . COLONOSCOPY    . CORONARY STENT INTERVENTION N/A 10/15/2019   Procedure: CORONARY STENT INTERVENTION;  Surgeon: Jettie Booze, MD;  Location: [place] CV LAB;  Service: Cardiovascular;  Laterality: N/A;  . EP IMPLANTABLE DEVICE N/A 06/16/2015   Procedure: Pacemaker Implant;  Surgeon: Evans Lance, MD;  Location: [place] CV LAB;  Service: Cardiovascular;  Laterality: N/A;  . hemrrhoidectomy    . LEFT AND RIGHT HEART CATHETERIZATION WITH CORONARY ANGIOGRAM Bilateral 02/01/2011   Procedure: LEFT AND RIGHT HEART CATHETERIZATION WITH CORONARY ANGIOGRAM;  Surgeon: Hillary Bow, MD;  Location: [facility] CATH LAB;  Service: Cardiovascular;  Laterality: Bilateral;  . LEFT HEART CATH AND CORONARY ANGIOGRAPHY N/A 10/15/2019   Procedure: LEFT HEART CATH AND CORONARY ANGIOGRAPHY;  Surgeon: Jettie Booze, MD;  Location: [place] CV LAB;  Service: Cardiovascular;  Laterality: N/A;  . MAZE  03/15/2011   Procedure: MAZE;  Surgeon: Gaye Pollack, MD;  Location: [place];  Service: Open Heart Surgery;  Laterality: N/A;  . PACEMAKER INSERTION  1991   Guidant PPM, most recent Generator Change by Dr Olevia Perches was 08/22/06  . RIGHT/LEFT HEART CATH AND CORONARY ANGIOGRAPHY N/A 07/06/2016   Procedure: Right/Left Heart Cath and Coronary Angiography;  Surgeon: Sherren Mocha, MD;  Location: [place] CV LAB;  Service: Cardiovascular;  Laterality: N/A;  . TEE WITHOUT CARDIOVERSION  04/15/2011   Procedure: TRANSESOPHAGEAL ECHOCARDIOGRAM (TEE);  Surgeon: Loralie Champagne, MD;  Location: [place];  Service: Cardiovascular;  Laterality: N/A;  . TEE WITHOUT CARDIOVERSION N/A 09/11/2014   Procedure: TRANSESOPHAGEAL ECHOCARDIOGRAM (TEE);  Surgeon: Sueanne Margarita, MD;  Location: [facility] ENDOSCOPY;  Service: Cardiovascular;  Laterality: N/A;       Family History  Problem Relation Age of Onset  . Heart disease Brother        Twin brother has coronary disease and recent AVR for AS  . Anesthesia problems Neg Hx   . Hypotension Neg Hx   . Malignant hyperthermia Neg Hx   . Pseudochol deficiency Neg Hx     Social History   Tobacco  Use  . Smoking status: Never Smoker  . Smokeless tobacco: Never Used  Vaping Use  . Vaping Use: Never used  Substance Use Topics  . Alcohol use: No    Alcohol/week: 0.0 standard drinks  . Drug use: No    Home Medications Prior to Admission medications   Medication Sig Start Date End Date Taking? Authorizing Provider  amLODipine (NORVASC) 5 MG tablet Take 1 tablet (5 mg total) by mouth at bedtime as needed. 10/29/19   Sherren Mocha, MD  bisacodyl (DULCOLAX) 5 MG EC tablet Take 5 mg by mouth as needed (constipation).     [provider]  butalbital-acetaminophen-caffeine (FIORICET, ESGIC) 50-325-40 MG per tablet Take 1 tablet by mouth 2 (two) times daily as needed  (osteo-arthritis pain).  02/28/13   [provider]  Cholecalciferol (VITAMIN D3) 2000 units capsule Take 1,000 Units by mouth daily.     [provider]  clopidogrel (PLAVIX) 75 MG tablet Take 1 tablet (75 mg total) by mouth daily with breakfast. 10/17/19   Aline August, MD  Coenzyme Q10 100 MG capsule Take 100 mg by mouth daily.     [provider]  Diclofenac Sodium 1.5 % SOLN Take 1 application by mouth 2 (two) times daily as needed (Knee pain).  02/28/13   [provider]  doxazosin (CARDURA) 4 MG tablet Take 4 mg by mouth at bedtime.     [provider]  ELIQUIS 5 MG TABS tablet TAKE 1 TABLET BY MOUTH 2 TIMES DAILY. 04/26/19   Evans Lance, MD  ezetimibe (ZETIA) 10 MG tablet Take 1 tablet (10 mg total) by mouth daily. 10/31/18   Evans Lance, MD  HYDROcodone-acetaminophen (NORCO) 10-325 MG tablet TAKE 1 TABLET BY MOUTH TWICE DAILY AS NEEDED FOR MODERATE PAIN 10/24/19   Bo Merino, MD  LORazepam (ATIVAN) 1 MG tablet Take 1 mg by mouth at bedtime.  01/05/12   Hillary Bow, MD  magnesium oxide (MAG-OX) 400 MG tablet Take 400 mg by mouth daily.    [provider]  metoprolol succinate (TOPROL-XL) 100 MG 24 hr tablet Take 1 tablet (100 mg total) by mouth 2 (two) times daily. Take with or immediately following a meal. 10/16/19   Aline August, MD  nitroGLYCERIN (NITROSTAT) 0.4 MG SL tablet Place 1 tablet (0.4 mg total) under the tongue every 5 (five) minutes as needed for chest pain. 10/29/19   Sherren Mocha, MD  OVER THE COUNTER MEDICATION Apply 1 patch topically daily as needed (neck and shoulder pain). Chinese pain patch    [provider]  pantoprazole (PROTONIX) 40 MG tablet Take 1 tablet (40 mg total) by mouth daily. 10/16/19   Aline August, MD  polyethylene glycol (MIRALAX / GLYCOLAX) 17 g packet Take 17 g by mouth daily.    [provider]  potassium chloride SA (KLOR-CON) 20 MEQ tablet Take 20 mEq by  mouth daily.    [provider]  predniSONE (DELTASONE) 5 MG tablet Take 5 mg by mouth daily with breakfast.    [provider]  rosuvastatin (CRESTOR) 20 MG tablet TAKE 1 TABLET BY MOUTH AT BEDTIME. 05/09/19   Sherren Mocha, MD    Allergies    Contrast media [iodinated diagnostic agents], Gadolinium derivatives, and Metrizamide  Review of Systems   Review of Systems  Respiratory: Positive for chest tightness.   Cardiovascular: Positive for palpitations.  All other systems reviewed and are negative.   Physical Exam Updated Vital Signs BP (!) 157/81  Pulse 74   Temp 98.3 F (36.8 C) (Oral)   Resp 18   Ht 6\' 2"  (1.88 m)   Wt 109 kg   SpO2 96%   BMI 30.85 kg/m   Physical Exam Vitals and nursing note reviewed.  Constitutional:      General: He is not in acute distress.    Appearance: Normal appearance. He is well-developed.  HENT:     Head: Normocephalic and atraumatic.     Right Ear: Hearing normal.     Left Ear: Hearing normal.     Nose: Nose normal.  Eyes:     Conjunctiva/sclera: Conjunctivae normal.     Pupils: Pupils are equal, round, and reactive to light.  Cardiovascular:     Rate and Rhythm: Rhythm irregular.     Heart sounds: S1 normal and S2 normal. No murmur heard.  No friction rub. No gallop.   Pulmonary:     Effort: Pulmonary effort is normal. No respiratory distress.     Breath sounds: Normal breath sounds.  Chest:     Chest wall: No tenderness.  Abdominal:     General: Bowel sounds are normal.     Palpations: Abdomen is soft.     Tenderness: There is no abdominal tenderness. There is no guarding or rebound. Negative signs include Murphy's sign and McBurney's sign.     Hernia: No hernia is present.  Musculoskeletal:        General: Normal range of motion.     Cervical back: Normal range of motion and neck supple.  Skin:    General: Skin is warm and dry.     Findings: No rash.  Neurological:     Mental Status: He is alert and  oriented to person, place, and time.     GCS: GCS eye subscore is 4. GCS verbal subscore is 5. GCS motor subscore is 6.     Cranial Nerves: No cranial nerve deficit.     Sensory: No sensory deficit.     Coordination: Coordination normal.  Psychiatric:        Speech: Speech normal.        Behavior: Behavior normal.        Thought Content: Thought content normal.     ED Results / Procedures / Treatments   Labs (all labs ordered are listed, but only abnormal results are displayed) Labs Reviewed  BASIC METABOLIC PANEL - Abnormal; Notable for the following components:      Result Value   Glucose, Bld 169 (*)    Calcium 8.7 (*)    All other components within normal limits  CBC - Abnormal; Notable for the following components:   RBC 3.82 (*)    Hemoglobin 10.8 (*)    HCT 34.3 (*)    All other components within normal limits  TROPONIN I (HIGH SENSITIVITY) - Abnormal; Notable for the following components:   Troponin I (High Sensitivity) 36 (*)    All other components within normal limits  TROPONIN I (HIGH SENSITIVITY) - Abnormal; Notable for the following components:   Troponin I (High Sensitivity) 96 (*)    All other components within normal limits  SARS CORONAVIRUS 2 BY RT PCR (HOSPITAL ORDER, [place] LAB)  URINE CULTURE  URINALYSIS, ROUTINE W REFLEX MICROSCOPIC  TROPONIN I (HIGH SENSITIVITY)    EKG EKG Interpretation  Date/Time:  Tuesday November 06 2019 02:20:18 EDT Ventricular Rate:  89 PR Interval:    QRS Duration: 147 QT Interval:  404  QTC Calculation: 492 R Axis:   -65 Text Interpretation: Atrial fibrillation Right bundle branch block LVH with IVCD and secondary repol abnrm Borderline prolonged QT interval Confirmed by Orpah Greek [phone]) on 11/06/2019 4:40:35 AM   Radiology DG Chest Port 1 View  Result Date: 11/06/2019 CLINICAL DATA:  Chest pain EXAM: PORTABLE CHEST 1 VIEW COMPARISON:  10/13/2019 FINDINGS: The heart size and  mediastinal contours are within normal limits. Both lungs are clear. The visualized skeletal structures are unremarkable. Median sternotomy, prosthetic aortic valve and left atrial appendage clip. Bilateral pacemakers. IMPRESSION: No active disease. Electronically Signed   By: Ulyses Jarred M.D.   On: 11/06/2019 02:59    Procedures Procedures (including critical care time)  Medications Ordered in ED Medications - No data to display  ED Course  I have reviewed the triage vital signs and the nursing notes.  Pertinent labs & imaging results that were available during my care of the patient were reviewed by me and considered in my medical decision making (see chart for details).    MDM Rules/Calculators/A&P                          Patient brought to the emergency department after he had sudden onset of feeling poorly tonight.  Patient reports that he started to feel sweaty with diffuse myalgias that he thought was a viremia.  He recently had his third Covid vaccination shot.  He was found to have tachycardia.  He took an extra Lopressor dose and his home health nurse administered additional Lopressor.  EMS found the patient still tachycardic, atrial fibrillation with rapid ventricular response.  He was given 2 doses of IV Cardizem during transport and is now rate controlled at arrival.  He did have some tightness in his chest when his heart rate was in the 180s but this has resolved at arrival and has not reoccurred.  Patient did have stenting last month with a similar episode.  He is on Eliquis and Plavix.  First troponin was 36, repeat was 96.  Discussed with cardiology fellow on-call, it was felt that the patient did not require hospitalization.  Based on his recent presentation, however, it was felt prudent to obtain a third troponin.  This is pending at this time.  We will set oncoming ER physician.  If there is any significant elevation, can discuss disposition with cardiology.      CHA2DS2/VAS Stroke Risk Points  Current as of 37 minutes ago     4 >= 2 Points: High Risk  1 - 1.99 Points: Medium Risk  0 Points: Low Risk    Last Change: N/A      Details    This score determines the patient's risk of having a stroke if the  patient has atrial fibrillation.       Points Metrics  1 Has Congestive Heart Failure:  Yes    Current as of 37 minutes ago  1 Has Vascular Disease:  Yes    Current as of 37 minutes ago  0 Has Hypertension:  No    Current as of 37 minutes ago  2 Age:  52    Current as of 37 minutes ago  0 Has Diabetes:  No    Current as of 37 minutes ago  0 Had Stroke:  No  Had TIA:  No  Had thromboembolism:  No    Current as of 37 minutes ago  0 Male:  No  Current as of 37 minutes ago           Final Clinical Impression(s) / ED Diagnoses Final diagnoses:  Atrial fibrillation with RVR [facility])    Rx / DC Orders ED Discharge Orders    None       Sabrina Keough, Gwenyth Allegra, MD 11/06/19 [phone]

## 2019-11-06 NOTE — ED Triage Notes (Signed)
Patient arrived with EMS from home reports central chest pain/palpitations this evening , Afib/RVR 180-200's at home , he received Cardizem 20 mg by EMS prior to arrival , HR = 90's at arrival , denies chest pain /respirations unlabored, history of CAD/Coronary stent , his cardiologist is Dr. Burt Knack.

## 2019-11-06 NOTE — ED Notes (Signed)
Pt ambulated independently in the hall with steady gait. No complaint of dizziness.

## 2019-11-07 MED FILL — BUTALB-ACETAMIN-CAFF 50-325: 50-325-40 | 90 days supply | Qty: 180 | Fill #0

## 2019-11-08 LAB — URINE CULTURE: Culture: >=100000 — AB

## 2019-11-08 NOTE — Telephone Encounter (Signed)
The patient has been scheduled for consult with Dr. Cruzita Lederer 10/19.

## 2019-11-09 ENCOUNTER — Ambulatory Visit (INDEPENDENT_AMBULATORY_CARE_PROVIDER_SITE_OTHER): Payer: Medicare Other | Admitting: *Deleted

## 2019-11-09 ENCOUNTER — Telehealth: Payer: Self-pay

## 2019-11-09 DIAGNOSIS — Z950 Presence of cardiac pacemaker: Secondary | ICD-10-CM

## 2019-11-09 LAB — CUP PACEART REMOTE DEVICE CHECK
Battery Remaining Longevity: 90 mo
Battery Remaining Percentage: 86 %
Brady Statistic RA Percent Paced: 1 %
Brady Statistic RV Percent Paced: 29 %
Date Time Interrogation Session: 20210910021100
Implantable Lead Implant Date: 20170417
Implantable Lead Implant Date: 20170417
Implantable Lead Location: 753859
Implantable Lead Location: 753860
Implantable Lead Model: 7740
Implantable Lead Model: 7741
Implantable Lead Serial Number: 662696
Implantable Lead Serial Number: 751382
Implantable Pulse Generator Implant Date: 20170417
Lead Channel Impedance Value: 669 Ohm
Lead Channel Impedance Value: 691 Ohm
Lead Channel Setting Pacing Amplitude: 2.5 V
Lead Channel Setting Pacing Amplitude: 2.5 V
Lead Channel Setting Pacing Pulse Width: 0.4 ms
Lead Channel Setting Sensing Sensitivity: 2.5 mV
Pulse Gen Serial Number: 718418

## 2019-11-09 NOTE — Progress Notes (Signed)
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 Chest Pain/Palpitations    AFIB/RVR    Recent Results (from the past 720 hour(s))  SARS Coronavirus 2 by RT PCR (hospital order, performed in [facility] hospital lab) Nasopharyngeal Nasopharyngeal Swab     Status: None   Collection Time: 10/13/19  3:59 PM   Specimen: Nasopharyngeal Swab  Result Value Ref Range Status   SARS Coronavirus 2 NEGATIVE NEGATIVE Final    Comment: (NOTE) SARS-CoV-2 target nucleic acids are NOT DETECTED.  The SARS-CoV-2 RNA is generally detectable in upper and lower respiratory specimens during the acute phase of infection. The lowest concentration of SARS-CoV-2 viral copies this assay can detect is 250 copies / mL. A negative result does not preclude SARS-CoV-2 infection and should not be used as the sole basis for treatment or other patient management decisions.  A negative result may occur with improper specimen collection / handling, submission of specimen other than nasopharyngeal swab, presence of viral mutation(s) within the areas targeted by this assay, and inadequate number of viral copies (<250 copies / mL). A negative result must be combined with clinical observations, patient history, and epidemiological information.  Fact Sheet for Patients:   StrictlyIdeas.no  Fact Sheet for Healthcare Providers: BankingDealers.co.za  This test is not yet approved or  cleared by the [place] FDA and has been authorized for detection and/or diagnosis of SARS-CoV-2 by FDA under an Emergency Use Authorization (EUA).  This EUA will remain in effect (meaning this test can be used) for the duration of the COVID-19 declaration under Section 564(b)(1) of the Act, 21 U.S.C. section 360bbb-3(b)(1), unless the  authorization is terminated or revoked sooner.  Performed at [facility], [place] [address], [place], [place] [address]   SARS Coronavirus 2 by RT PCR (hospital order, performed in [facility] hospital lab) Nasopharyngeal Nasopharyngeal Swab     Status: None   Collection Time: 11/06/19  2:30 AM   Specimen: Nasopharyngeal Swab  Result Value Ref Range Status   SARS Coronavirus 2 NEGATIVE NEGATIVE Final    Comment: (NOTE) SARS-CoV-2 target nucleic acids are NOT DETECTED.  The SARS-CoV-2 RNA is generally detectable in upper and lower respiratory specimens during the acute phase of infection. The lowest concentration of SARS-CoV-2 viral copies this assay can detect is 250 copies / mL. A negative result does not preclude SARS-CoV-2 infection and should not be used as the sole basis for treatment or other patient management decisions.  A negative result may occur with improper specimen collection / handling, submission of specimen other than nasopharyngeal swab, presence of viral mutation(s) within the areas targeted by this assay, and inadequate number of viral copies (<250 copies / mL). A negative result must be combined with clinical observations, patient history, and epidemiological information.  Fact Sheet for Patients:   StrictlyIdeas.no  Fact Sheet for Healthcare Providers: BankingDealers.co.za  This test is not yet approved or  cleared by the [place] FDA and has been authorized for detection and/or diagnosis of SARS-CoV-2 by FDA under an Emergency Use Authorization (EUA).  This EUA will remain in effect (meaning this test can be used) for the duration of the COVID-19 declaration under Section 564(b)(1) of the Act, 21 U.S.C. section 360bbb-3(b)(1), unless the authorization is terminated or revoked sooner.  Performed at [facility]  Hospital Lab, [place] [address], [place], [place] [address]   Urine culture     Status: Abnormal    Collection Time: 11/06/19  7:41 AM   Specimen: Urine, Random  Result Value Ref Range Status   Specimen Description URINE, RANDOM  Final   Special Requests   Final    NONE Performed at [facility], [place] [address] [place], [place] [address]    Culture >=100,000 COLONIES/mL ENTEROCOCCUS FAECALIS (A)  Final   Report Status 11/08/2019 FINAL  Final   Organism ID, Bacteria ENTEROCOCCUS FAECALIS (A)  Final      Susceptibility   Enterococcus faecalis - MIC*    AMPICILLIN <=2 SENSITIVE Sensitive     NITROFURANTOIN <=16 SENSITIVE Sensitive     VANCOMYCIN 2 SENSITIVE Sensitive     * >=100,000 COLONIES/mL ENTEROCOCCUS FAECALIS    [x]  Treated with Keflex, organism resistant to prescribed antimicrobial  36 YOM who presented with CP/palpitations and was found to be in Afib w/ RVR. The patient also noted issues with urinary retention and mild dysuria. MD wanted to treat for UTI - urinary culture growing Enterococcus not covered by Keflex  New antibiotic prescription: D/c Keflex and start Amoxicillin 500 mg po 3x/day for 7 days  ED Provider: Eustaquio Maize, PA-C   Lawson Radar 11/09/2019, 9:14 AM Clinical Pharmacist Monday - Friday phone -  [phone] Saturday - Sunday phone - [phone]

## 2019-11-09 NOTE — Telephone Encounter (Signed)
Post ED Visit - Positive Culture Follow-up: Unsuccessful Patient Follow-up  Culture assessed and recommendations reviewed by:  []  Elenor Quinones, Pharm.D. []  Heide Guile, Pharm.D., BCPS AQ-ID []  Parks Neptune, Pharm.D., BCPS [x]  Alycia Rossetti, Pharm.D., BCPS []  [place], [place].D., BCPS, AAHIVP []  Legrand Como, Pharm.D., BCPS, AAHIVP []  Wynell Balloon, PharmD []  Vincenza Hews, PharmD, BCPS  Positive urine culture Pt needs amoxicillin 500 mg PO TIC x 7 days per  M. [place] PA []  Patient discharged without antimicrobial prescription and treatment is now indicated [x]  Organism is resistant to prescribed ED discharge antimicrobial []  Patient with positive blood cultures   Unable to contact patient after 3 attempts, letter will be sent to address on file  Genia Del 11/09/2019, 9:43 AM

## 2019-11-12 NOTE — Progress Notes (Signed)
Remote pacemaker transmission.   

## 2019-11-14 ENCOUNTER — Other Ambulatory Visit: Payer: Self-pay | Admitting: Cardiovascular Disease

## 2019-11-14 ENCOUNTER — Other Ambulatory Visit: Payer: Self-pay | Admitting: Internal Medicine

## 2019-11-14 MED ORDER — PANTOPRAZOLE SODIUM 40 MG PO TBEC: 40.0000 mg | DELAYED_RELEASE_TABLET | Freq: Every day | ORAL | 3 refills | Status: DC

## 2019-11-14 MED ORDER — ROSUVASTATIN CALCIUM 20 MG PO TABS: 20.0000 mg | ORAL_TABLET | Freq: Every day | ORAL | 3 refills | Status: DC

## 2019-11-14 MED ORDER — CLOPIDOGREL BISULFATE 75 MG PO TABS: 75.0000 mg | ORAL_TABLET | Freq: Every day | ORAL | 0 refills | Status: AC

## 2019-11-14 MED ORDER — APIXABAN 5 MG PO TABS: 5.0000 mg | ORAL_TABLET | Freq: Two times a day (BID) | ORAL | 3 refills | Status: DC

## 2019-11-14 MED ORDER — EZETIMIBE 10 MG PO TABS: 10.0000 mg | ORAL_TABLET | Freq: Every day | ORAL | 3 refills | Status: DC

## 2019-11-14 MED FILL — ROSUVASTATIN CALCIUM 20 MG: 20 | 90 days supply | Qty: 90 | Fill #2 | Status: TO

## 2019-11-14 MED FILL — METOPROLOL SUCCINATE ER 100: 100 | 90 days supply | Qty: 180 | Fill #1

## 2019-11-14 MED FILL — CLOPIDOGREL 75 MG TABLET: 75 | 60 days supply | Qty: 60 | Fill #0

## 2019-11-14 MED FILL — DOXAZOSIN MESYLATE 4 MG TAB: 4 | 90 days supply | Qty: 90 | Fill #2

## 2019-11-14 MED FILL — PANTOPRAZOLE SOD DR 40 MG T: 40 | 90 days supply | Qty: 90 | Fill #0

## 2019-11-14 MED FILL — ELIQUIS 5 MG TABLET: 5 | 90 days supply | Qty: 180 | Fill #0

## 2019-11-14 MED FILL — POTASSIUM CHLORIDE CRYS ER: 20 | 30 days supply | Qty: 60 | Fill #2

## 2019-11-14 MED FILL — EZETIMIBE 10 MG TABS: 10 | 90 days supply | Qty: 90 | Fill #0

## 2019-11-14 NOTE — Telephone Encounter (Signed)
10M 100.7KG SCR 0.99 11/06/19 LOVW/COOPER 10/29/19

## 2019-11-19 DIAGNOSIS — H5213 Myopia, bilateral: Secondary | ICD-10-CM | POA: Diagnosis not present

## 2019-11-19 DIAGNOSIS — H04123 Dry eye syndrome of bilateral lacrimal glands: Secondary | ICD-10-CM | POA: Diagnosis not present

## 2019-11-19 DIAGNOSIS — H43813 Vitreous degeneration, bilateral: Secondary | ICD-10-CM | POA: Diagnosis not present

## 2019-11-19 DIAGNOSIS — H25813 Combined forms of age-related cataract, bilateral: Secondary | ICD-10-CM | POA: Diagnosis not present

## 2019-11-24 ENCOUNTER — Other Ambulatory Visit: Payer: Self-pay | Admitting: Rheumatology

## 2019-11-26 ENCOUNTER — Encounter: Payer: Self-pay | Admitting: Cardiovascular Disease

## 2019-11-26 ENCOUNTER — Other Ambulatory Visit: Payer: Self-pay

## 2019-11-26 ENCOUNTER — Ambulatory Visit (INDEPENDENT_AMBULATORY_CARE_PROVIDER_SITE_OTHER): Payer: Medicare Other | Admitting: Cardiovascular Disease

## 2019-11-26 VITALS — BP 140/82 | HR 74 | Ht 73.0 in | Wt 220.0 lb

## 2019-11-26 DIAGNOSIS — I209 Angina pectoris, unspecified: Secondary | ICD-10-CM

## 2019-11-26 DIAGNOSIS — I484 Atypical atrial flutter: Secondary | ICD-10-CM | POA: Diagnosis not present

## 2019-11-26 DIAGNOSIS — I2510 Atherosclerotic heart disease of native coronary artery without angina pectoris: Secondary | ICD-10-CM

## 2019-11-26 DIAGNOSIS — I5032 Chronic diastolic (congestive) heart failure: Secondary | ICD-10-CM | POA: Diagnosis not present

## 2019-11-26 DIAGNOSIS — I4819 Other persistent atrial fibrillation: Secondary | ICD-10-CM | POA: Diagnosis not present

## 2019-11-26 MED FILL — HYDROCODON-APAP 10-325: 10-325 | 30 days supply | Qty: 60 | Fill #0

## 2019-11-26 NOTE — Patient Instructions (Signed)
Medication Instructions:  Your provider recommends that you continue on your current medications as directed. Please refer to the Current Medication list given to you today.   *If you need a refill on your cardiac medications before your next appointment, please call your pharmacy*  Lab Work: TODAY! BMET, CBC, urinalysis  If you have labs (blood work) drawn today and your tests are completely normal, you will receive your results only by: Marland Kitchen MyChart Message (if you have MyChart) OR . A paper copy in the mail If you have any lab test that is abnormal or we need to change your treatment, we will call you to review the results.  Follow-Up: Your provider recommends that you schedule a follow-up appointment in 4 months with Dr. Burt Knack.

## 2019-11-26 NOTE — Telephone Encounter (Signed)
Last Visit:02/14/2019 telemedicine Next Visit:patient will schedule UDS: 07/21/2019 Narc Agreement:07/24/2019  Last Fill: 10/24/2019  Okay to refill Hydrocodone?

## 2019-11-26 NOTE — Progress Notes (Signed)
Cardiology Office Note:    Date:  11/26/2019   ID:  Kevin Buff, [name], DOB 1934/05/27, MRN [id number]  PCP:  [name], [name]  [facility] Cardiologist:  Sherren Mocha, [name]  [place] Electrophysiologist:  None   Referring [name]: [name], [name]   Chief Complaint  Patient presents with  . Shortness of Breath    History of Present Illness:    Kevin Buff, [name] is a 85 y.o. male presenting for follow-up evaluation today.  Dr. Romie Minus has been evaluated frequently over recent months.  I last saw him October 29, 2019.  Since the time of my last evaluation he was seen in the emergency department with atrial flutter with RVR in the setting of a urinary tract infection.  He was treated with diltiazem with good response.  His urinary tract infection was also treated.  The patient returns today in is doing fairly well.  His weakness is slowly improving.  He still has significant weak spells, especially in the postprandial period.  He has not had frank syncope.  He has not had any recent chest pain or pressure.  His exertional dyspnea is essentially unchanged.  Heart rate and blood pressure readings are reviewed today as they are frequently recorded at home.  His heart rates have ranged in the 60s and 70s on a fairly consistent basis.  His blood pressure has been very labile over time but appears to be more consistent based on my review of his most recent readings.  He takes amlodipine on an as-needed basis and has only taken it once over the last several weeks.  Past Medical History:  Diagnosis Date  . Aortic stenosis    moderate aortic stenosis  . Arthritis   . Benign prostatic hypertrophy   . 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  . Coronary artery disease    status post multiple prior percutaneous coronary  interventions, microvascular angina per Dr Olevia Perches  . Diverticulitis sigmoid colon recurrent   . Heart murmur   . Hypercoagulable state ([place])    chronically anticoagulated with coumadin  . Hyperlipidemia   . Hyperthyroidism   . Hypothyroidism    Dr. Elyse Hsu  . MGUS (monoclonal gammopathy of unknown significance) 02/17/2013  . Paroxysmal atrial fibrillation ([place])    DR. Lia Foyer,   . Prediabetes 09/21/2017  . Stroke [facility])    1990    Past Surgical History:  Procedure Laterality Date  . AORTIC VALVE REPLACEMENT  03/15/2011   Procedure: AORTIC VALVE REPLACEMENT (AVR);  Surgeon: Gaye Pollack, [name];  Location: [place];  Service: Open Heart Surgery;  Laterality: N/A;  . APPENDECTOMY    . CARDIAC CATHETERIZATION     11  . CARDIOVERSION    . CARDIOVERSION  04/15/2011   Procedure: CARDIOVERSION;  Surgeon: Loralie Champagne, [name];  Location: [place];  Service: Cardiovascular;  Laterality: N/A;  . CARDIOVERSION N/A 09/11/2014   Procedure: CARDIOVERSION;  Surgeon: Sueanne Margarita, [name];  Location[facility] ENDOSCOPY;  Service: Cardiovascular;  Laterality: N/A;  . CARDIOVERSION N/A 06/27/2015   Procedure: CARDIOVERSION;  Surgeon: Thayer Headings, [name];  Location: [facility] ENDOSCOPY;  Service: Cardiovascular;  Laterality: N/A;  . CARDIOVERSION N/A 07/04/2015   Procedure: CARDIOVERSION;  Surgeon: Evans Lance, [name];  Location: [place];  Service: Cardiovascular;  Laterality: N/A;  . CARDIOVERSION N/A 04/13/2017   Procedure: CARDIOVERSION;  Surgeon: Sueanne Margarita, [name];  Location: [facility] ENDOSCOPY;  Service: Cardiovascular;  Laterality: N/A;  . COLONOSCOPY    . CORONARY STENT INTERVENTION N/A 10/15/2019   Procedure: CORONARY STENT INTERVENTION;  Surgeon: Jettie Booze, [name];  Location: [place] CV LAB;  Service: Cardiovascular;  Laterality: N/A;  . EP IMPLANTABLE DEVICE N/A 06/16/2015   Procedure: Pacemaker Implant;  Surgeon: Evans Lance, [name];  Location: [place] CV LAB;  Service: Cardiovascular;  Laterality:  N/A;  . hemrrhoidectomy    . LEFT AND RIGHT HEART CATHETERIZATION WITH CORONARY ANGIOGRAM Bilateral 02/01/2011   Procedure: LEFT AND RIGHT HEART CATHETERIZATION WITH CORONARY ANGIOGRAM;  Surgeon: Hillary Bow, [name];  Location: [facility] CATH LAB;  Service: Cardiovascular;  Laterality: Bilateral;  . LEFT HEART CATH AND CORONARY ANGIOGRAPHY N/A 10/15/2019   Procedure: LEFT HEART CATH AND CORONARY ANGIOGRAPHY;  Surgeon: Jettie Booze, [name];  Location: [place] CV LAB;  Service: Cardiovascular;  Laterality: N/A;  . MAZE  03/15/2011   Procedure: MAZE;  Surgeon: Gaye Pollack, [name];  Location: [place];  Service: Open Heart Surgery;  Laterality: N/A;  . PACEMAKER INSERTION  1991   Guidant PPM, most recent Generator Change by Dr Olevia Perches was 08/22/06  . RIGHT/LEFT HEART CATH AND CORONARY ANGIOGRAPHY N/A 07/06/2016   Procedure: Right/Left Heart Cath and Coronary Angiography;  Surgeon: Sherren Mocha, [name];  Location: [place] CV LAB;  Service: Cardiovascular;  Laterality: N/A;  . TEE WITHOUT CARDIOVERSION  04/15/2011   Procedure: TRANSESOPHAGEAL ECHOCARDIOGRAM (TEE);  Surgeon: Loralie Champagne, [name];  Location: [place];  Service: Cardiovascular;  Laterality: N/A;  . TEE WITHOUT CARDIOVERSION N/A 09/11/2014   Procedure: TRANSESOPHAGEAL ECHOCARDIOGRAM (TEE);  Surgeon: Sueanne Margarita, [name];  Location: [facility] ENDOSCOPY;  Service: Cardiovascular;  Laterality: N/A;    Current Medications: Current Meds  Medication Sig  . amLODipine (NORVASC) 5 MG tablet Take 1 tablet (5 mg total) by mouth at bedtime as needed.  Marland Kitchen apixaban (ELIQUIS) 5 MG TABS tablet Take 1 tablet (5 mg total) by mouth 2 (two) times daily.  . bisacodyl (DULCOLAX) 5 MG EC tablet Take 5 mg by mouth as needed (constipation).   . butalbital-acetaminophen-caffeine (FIORICET, ESGIC) 50-325-40 MG per tablet Take 1 tablet by mouth 2 (two) times daily as needed for headache (osteo-arthritis pain).   . Cholecalciferol (VITAMIN D3) 2000 units capsule Take 1,000 Units  by mouth daily.   . clopidogrel (PLAVIX) 75 MG tablet Take 1 tablet (75 mg total) by mouth daily with breakfast.  . Coenzyme Q10 100 MG capsule Take 100 mg by mouth daily.   . diclofenac Sodium (VOLTAREN) 1 % GEL Apply 2 g topically 2 (two) times daily as needed (knee pain).  Marland Kitchen diltiazem (CARDIZEM) 30 MG tablet Take 1 tablet (30 mg total) by mouth every 6 (six) hours as needed (for HRs greater than 150).  Marland Kitchen doxazosin (CARDURA) 4 MG tablet Take 4 mg by mouth at bedtime.   Marland Kitchen ezetimibe (ZETIA) 10 MG tablet Take 1 tablet (10 mg total) by mouth daily.  Marland Kitchen HYDROcodone-acetaminophen (NORCO) 10-325 MG tablet TAKE 1 TABLET BY MOUTH TWICE DAILY AS NEEDED FOR MODERATE PAIN  . LORazepam (ATIVAN) 1 MG tablet Take 1 mg by mouth at bedtime.   . magnesium oxide (MAG-OX) 400 MG tablet Take 400 mg by mouth daily.  . metoprolol succinate (TOPROL-XL) 100 MG 24 hr tablet Take 1 tablet (100 mg total) by mouth 2 (two) times daily. Take with or immediately following a meal.  . nitroGLYCERIN (NITROSTAT)  0.4 MG SL tablet Place 1 tablet (0.4 mg total) under the tongue every 5 (five) minutes as needed for chest pain.  Marland Kitchen OVER THE COUNTER MEDICATION Apply 1 patch topically daily as needed (neck and shoulder pain). Chinese pain patch  . pantoprazole (PROTONIX) 40 MG tablet Take 1 tablet (40 mg total) by mouth daily.  . polyethylene glycol (MIRALAX / GLYCOLAX) 17 g packet Take 17 g by mouth daily as needed for mild constipation.   . potassium chloride SA (KLOR-CON) 20 MEQ tablet Take 20 mEq by mouth daily.  . predniSONE (DELTASONE) 5 MG tablet Take 5 mg by mouth daily with breakfast.  . rosuvastatin (CRESTOR) 20 MG tablet Take 1 tablet (20 mg total) by mouth at bedtime.     Allergies:   Contrast media [iodinated diagnostic agents], Gadolinium derivatives, and Metrizamide   Social History   Socioeconomic History  . Marital status: Married    Spouse name: Not on file  . Number of children: Not on file  . Years of  education: Not on file  . Highest education level: Not on file  Occupational History  . Occupation: Retired    Comment: Physician  Tobacco Use  . Smoking status: Never Smoker  . Smokeless tobacco: Never Used  Vaping Use  . Vaping Use: Never used  Substance and Sexual Activity  . Alcohol use: No    Alcohol/week: 0.0 standard drinks  . Drug use: No  . Sexual activity: Not on file  Other Topics Concern  . Not on file  Social History Narrative   Married to [place]. Has grown children   Retired Horticulturist, commercial [name]      Never smoker no alcohol      Social Determinants of Radio broadcast assistant Strain:   . Difficulty of Paying Living Expenses: Not on file  Food Insecurity:   . Worried About Charity fundraiser in the Last Year: Not on file  . Ran Out of Food in the Last Year: Not on file  Transportation Needs:   . Lack of Transportation (Medical): Not on file  . Lack of Transportation (Non-Medical): Not on file  Physical Activity:   . Days of Exercise per Week: Not on file  . Minutes of Exercise per Session: Not on file  Stress:   . Feeling of Stress : Not on file  Social Connections:   . Frequency of Communication with Friends and Family: Not on file  . Frequency of Social Gatherings with Friends and Family: Not on file  . Attends Religious Services: Not on file  . Active Member of Clubs or Organizations: Not on file  . Attends Archivist Meetings: Not on file  . Marital Status: Not on file     Family History: The patient's family history includes Heart disease in his brother. There is no history of Anesthesia problems, Hypotension, Malignant hyperthermia, or Pseudochol deficiency.  ROS:   Please see the history of present illness.    All other systems reviewed and are negative.  EKGs/Labs/Other Studies Reviewed:    EKG:  EKG is ordered today.  The ekg ordered today demonstrates atrial flutter with variable response, heart rate 74 bpm, right bundle branch  block.  Recent Labs: 01/31/2019: NT-Pro BNP 842 10/14/2019: ALT 19; TSH 2.231 10/15/2019: Magnesium 2.2 11/06/2019: BUN 13; Creatinine, Ser 0.99; Hemoglobin 10.8; Platelets 180; Potassium 3.6; Sodium 136  Recent Lipid Panel    Component Value Date/Time   CHOL 134 10/16/2019 0355   CHOL  158 09/02/2017 1006   TRIG 55 10/16/2019 0355   HDL 78 10/16/2019 0355   HDL 72 09/02/2017 1006   CHOLHDL 1.7 10/16/2019 0355   VLDL 11 10/16/2019 0355   LDLCALC 45 10/16/2019 0355   LDLCALC 68 09/02/2017 1006   LDLDIRECT 117.9 11/16/2010 1355    Physical Exam:    VS:  BP 140/82   Pulse 74   Ht 6\' 1"  (1.854 m)   Wt 220 lb (99.8 kg)   SpO2 95%   BMI 29.03 kg/m     Wt Readings from Last 3 Encounters:  11/26/19 220 lb (99.8 kg)  11/06/19 240 lb 4.8 oz (109 kg)  10/29/19 222 lb (100.7 kg)     GEN:  Well nourished, well developed in no acute distress HEENT: Normal NECK: No JVD; No carotid bruits LYMPHATICS: No lymphadenopathy CARDIAC: Irregularly irregular, grade 2/6 systolic ejection murmur at the right upper sternal border, no diastolic murmur. RESPIRATORY:  Clear to auscultation without rales, wheezing or rhonchi  ABDOMEN: Soft, non-tender, non-distended MUSCULOSKELETAL:  No edema; No deformity  SKIN: Warm and dry NEUROLOGIC:  Alert and oriented x 3 PSYCHIATRIC:  Normal affect   ASSESSMENT:    1. Chronic diastolic (congestive) heart failure ([place])   2. Atypical atrial flutter (HCC)   3. Coronary artery disease involving native coronary artery of native heart without angina pectoris    PLAN:    In order of problems listed above:  1. Appears clinically stable.  I am going to repeat an echocardiogram and this is already on the books.  He had some LV dysfunction at the time of his recent hospital admission when he experienced atrial flutter with RVR and non-STEMI.  We will continue his current therapy at present. 2. Heart rate is much better controlled on his current dose of metoprolol  succinate.  He is tolerating apixaban without bleeding problems. 3. Currently stable without symptoms of angina.  Aspirin has been discontinued after recent PCI.  He remains on clopidogrel 75 mg daily.  Anticipate switching him back to low-dose aspirin in about 4 months.   Medication Adjustments/Labs and Tests Ordered: Current medicines are reviewed at length with the patient today.  Concerns regarding medicines are outlined above.  Orders Placed This Encounter  Procedures  . Basic metabolic panel  . CBC with Differential/Platelet  . Urinalysis, Routine w reflex microscopic  . EKG 12-Lead   No orders of the defined types were placed in this encounter.   Patient Instructions  Medication Instructions:  Your provider recommends that you continue on your current medications as directed. Please refer to the Current Medication list given to you today.   *If you need a refill on your cardiac medications before your next appointment, please call your pharmacy*  Lab Work: TODAY! BMET, CBC, urinalysis  If you have labs (blood work) drawn today and your tests are completely normal, you will receive your results only by: Marland Kitchen MyChart Message (if you have MyChart) OR . A paper copy in the mail If you have any lab test that is abnormal or we need to change your treatment, we will call you to review the results.  Follow-Up: Your provider recommends that you schedule a follow-up appointment in 4 months with Dr. Burt Knack.    Signed, Sherren Mocha, [name]  11/26/2019 5:04 PM    [place]

## 2019-11-27 LAB — BASIC METABOLIC PANEL
BUN/Creatinine Ratio: 14 (ref 10–24)
BUN: 12 mg/dL (ref 8–27)
CO2: 26 mmol/L (ref 20–29)
Calcium: 9.2 mg/dL (ref 8.6–10.2)
Chloride: 105 mmol/L (ref 96–106)
Creatinine, Ser: 0.88 mg/dL (ref 0.76–1.27)
GFR calc Af Amer: 92 mL/min/{1.73_m2} (ref >59)
GFR calc non Af Amer: 79 mL/min/{1.73_m2} (ref >59)
Glucose: 90 mg/dL (ref 65–99)
Potassium: 4.7 mmol/L (ref 3.5–5.2)
Sodium: 143 mmol/L (ref 134–144)

## 2019-11-27 LAB — CBC WITH DIFFERENTIAL/PLATELET
Basophils Absolute: 0 10*3/uL (ref 0.0–0.2)
Basos: 0 %
EOS (ABSOLUTE): 0.1 10*3/uL (ref 0.0–0.4)
Eos: 1 %
Hematocrit: 33.9 % — ABNORMAL LOW (ref 37.5–51.0)
Hemoglobin: 10.9 g/dL — ABNORMAL LOW (ref 13.0–17.7)
Immature Grans (Abs): 0 10*3/uL (ref 0.0–0.1)
Immature Granulocytes: 0 %
Lymphocytes Absolute: 0.8 10*3/uL (ref 0.7–3.1)
Lymphs: 11 %
MCH: 28.8 pg (ref 26.6–33.0)
MCHC: 32.2 g/dL (ref 31.5–35.7)
MCV: 89 fL (ref 79–97)
Monocytes Absolute: 0.4 10*3/uL (ref 0.1–0.9)
Monocytes: 6 %
Neutrophils Absolute: 5.6 10*3/uL (ref 1.4–7.0)
Neutrophils: 82 %
Platelets: 218 10*3/uL (ref 150–450)
RBC: 3.79 x10E6/uL — ABNORMAL LOW (ref 4.14–5.80)
RDW: 14.1 % (ref 11.6–15.4)
WBC: 6.9 10*3/uL (ref 3.4–10.8)

## 2019-11-28 ENCOUNTER — Encounter: Payer: Self-pay | Admitting: Internal Medicine

## 2019-11-28 ENCOUNTER — Ambulatory Visit: Payer: Medicare Other | Admitting: Internal Medicine

## 2019-11-28 ENCOUNTER — Other Ambulatory Visit: Payer: Self-pay

## 2019-11-28 VITALS — BP 138/76 | HR 66 | Temp 97.7°F | Ht 74.0 in | Wt 220.0 lb

## 2019-11-28 DIAGNOSIS — M4712 Other spondylosis with myelopathy, cervical region: Secondary | ICD-10-CM

## 2019-11-28 DIAGNOSIS — K648 Other hemorrhoids: Secondary | ICD-10-CM

## 2019-11-28 DIAGNOSIS — D472 Monoclonal gammopathy: Secondary | ICD-10-CM

## 2019-11-28 DIAGNOSIS — I2510 Atherosclerotic heart disease of native coronary artery without angina pectoris: Secondary | ICD-10-CM | POA: Diagnosis not present

## 2019-11-28 DIAGNOSIS — I4819 Other persistent atrial fibrillation: Secondary | ICD-10-CM | POA: Diagnosis not present

## 2019-11-28 DIAGNOSIS — F4322 Adjustment disorder with anxiety: Secondary | ICD-10-CM

## 2019-11-28 DIAGNOSIS — G7000 Myasthenia gravis without (acute) exacerbation: Secondary | ICD-10-CM | POA: Diagnosis not present

## 2019-11-28 DIAGNOSIS — E032 Hypothyroidism due to medicaments and other exogenous substances: Secondary | ICD-10-CM

## 2019-11-28 DIAGNOSIS — Z950 Presence of cardiac pacemaker: Secondary | ICD-10-CM

## 2019-11-28 DIAGNOSIS — I209 Angina pectoris, unspecified: Secondary | ICD-10-CM

## 2019-11-28 DIAGNOSIS — R5381 Other malaise: Secondary | ICD-10-CM

## 2019-11-28 DIAGNOSIS — I208 Other forms of angina pectoris: Secondary | ICD-10-CM | POA: Diagnosis not present

## 2019-11-28 DIAGNOSIS — Z7189 Other specified counseling: Secondary | ICD-10-CM | POA: Diagnosis not present

## 2019-11-28 DIAGNOSIS — T462X1A Poisoning by other antidysrhythmic drugs, accidental (unintentional), initial encounter: Secondary | ICD-10-CM | POA: Diagnosis not present

## 2019-11-28 DIAGNOSIS — I2089 Other forms of angina pectoris: Secondary | ICD-10-CM

## 2019-11-28 DIAGNOSIS — R339 Retention of urine, unspecified: Secondary | ICD-10-CM

## 2019-11-28 LAB — URINALYSIS, ROUTINE W REFLEX MICROSCOPIC

## 2019-11-28 MED ORDER — LORAZEPAM 1 MG PO TABS: ORAL_TABLET | ORAL | 0 refills | Status: DC

## 2019-11-28 MED FILL — LORazepam 1 MG TABS: 1 | 30 days supply | Qty: 45 | Fill #0
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
## 2019-12-04 ENCOUNTER — Ambulatory Visit (INDEPENDENT_AMBULATORY_CARE_PROVIDER_SITE_OTHER): Payer: Medicare Other | Admitting: Rheumatology

## 2019-12-04 ENCOUNTER — Other Ambulatory Visit: Payer: Self-pay

## 2019-12-04 ENCOUNTER — Encounter: Payer: Self-pay | Admitting: Rheumatology

## 2019-12-04 VITALS — BP 143/85 | HR 81 | Ht 73.5 in | Wt 220.0 lb

## 2019-12-04 DIAGNOSIS — M5030 Other cervical disc degeneration, unspecified cervical region: Secondary | ICD-10-CM | POA: Diagnosis not present

## 2019-12-04 DIAGNOSIS — Z7189 Other specified counseling: Secondary | ICD-10-CM

## 2019-12-04 DIAGNOSIS — G8929 Other chronic pain: Secondary | ICD-10-CM

## 2019-12-04 DIAGNOSIS — Z8719 Personal history of other diseases of the digestive system: Secondary | ICD-10-CM

## 2019-12-04 DIAGNOSIS — D6859 Other primary thrombophilia: Secondary | ICD-10-CM

## 2019-12-04 DIAGNOSIS — M19042 Primary osteoarthritis, left hand: Secondary | ICD-10-CM

## 2019-12-04 DIAGNOSIS — I25118 Atherosclerotic heart disease of native coronary artery with other forms of angina pectoris: Secondary | ICD-10-CM | POA: Diagnosis not present

## 2019-12-04 DIAGNOSIS — I209 Angina pectoris, unspecified: Secondary | ICD-10-CM

## 2019-12-04 DIAGNOSIS — I480 Paroxysmal atrial fibrillation: Secondary | ICD-10-CM

## 2019-12-04 DIAGNOSIS — M19012 Primary osteoarthritis, left shoulder: Secondary | ICD-10-CM

## 2019-12-04 DIAGNOSIS — M19011 Primary osteoarthritis, right shoulder: Secondary | ICD-10-CM | POA: Diagnosis not present

## 2019-12-04 DIAGNOSIS — Z5181 Encounter for therapeutic drug level monitoring: Secondary | ICD-10-CM

## 2019-12-04 DIAGNOSIS — Z950 Presence of cardiac pacemaker: Secondary | ICD-10-CM

## 2019-12-04 DIAGNOSIS — M7061 Trochanteric bursitis, right hip: Secondary | ICD-10-CM

## 2019-12-04 DIAGNOSIS — M5136 Other intervertebral disc degeneration, lumbar region: Secondary | ICD-10-CM

## 2019-12-04 DIAGNOSIS — M7918 Myalgia, other site: Secondary | ICD-10-CM

## 2019-12-04 DIAGNOSIS — R5383 Other fatigue: Secondary | ICD-10-CM

## 2019-12-04 DIAGNOSIS — Z8673 Personal history of transient ischemic attack (TIA), and cerebral infarction without residual deficits: Secondary | ICD-10-CM

## 2019-12-04 DIAGNOSIS — Z952 Presence of prosthetic heart valve: Secondary | ICD-10-CM

## 2019-12-04 DIAGNOSIS — M19041 Primary osteoarthritis, right hand: Secondary | ICD-10-CM | POA: Diagnosis not present

## 2019-12-04 DIAGNOSIS — D472 Monoclonal gammopathy: Secondary | ICD-10-CM

## 2019-12-04 MED ORDER — HYDROCODONE-ACETAMINOPHEN 5-325 MG PO TABS: 1.0000 | ORAL_TABLET | Freq: Three times a day (TID) | ORAL | 0 refills | Status: DC | PRN

## 2019-12-04 NOTE — Progress Notes (Signed)
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  He states about 6 weeks ago he developed tachycardia which was diagnosed as ventricular tachycardia.  The symptoms were controlled with diltiazem.  He also underwent coronary cath and RCA stent placement.  At the time he was also diagnosed with diverticulitis and treated with Cipro.  He was rehospitalized with tachycardia and had urinary tract infection at the time.  Now the heart  rate is regular.  He is on Eliquis and Plavix combination currently.  He has been experiencing increased pain.  He complains of pain and discomfort in his C-spine, bilateral shoulders, bilateral hands and his hips.  He has been extremely tired.  He states he is unable to stand and take a shower.  He has been sitting and doing activities.  He has been taking hydrocodone about half a tablet once or twice a day.  It makes a big difference in the pain and activity level.  Activities of Daily Living:  Patient reports morning stiffness for 24 hours.   Patient Reports nocturnal pain.  Difficulty dressing/grooming: Reports Difficulty climbing stairs: Reports Difficulty getting out of chair: Reports Difficulty using hands for taps, buttons, cutlery, and/or writing: Reports  Review of Systems  Constitutional: Positive for fatigue.  HENT: Negative for mouth sores, mouth dryness and nose dryness.   Eyes: Negative for pain, itching, visual disturbance and dryness.  Respiratory: Negative for cough, hemoptysis, shortness of breath and difficulty breathing.   Cardiovascular: Negative for chest pain, palpitations  and swelling in legs/feet.  Gastrointestinal: Negative for abdominal pain, blood in stool, constipation and diarrhea.  Endocrine: Negative for increased urination.  Genitourinary: Negative for difficulty urinating and painful urination.  Musculoskeletal: Positive for arthralgias, joint pain, myalgias, muscle weakness, morning stiffness, muscle tenderness and myalgias. Negative for joint swelling.  Skin: Negative for color change, rash and redness.  Allergic/Immunologic: Positive for susceptible to infections.  Neurological: Positive for dizziness, headaches and weakness. Negative for numbness and memory loss.  Hematological: Negative for swollen glands.  Psychiatric/Behavioral: Positive for sleep disturbance. Negative for confusion.    PMFS History:  Patient Active Problem List   Diagnosis Date Noted   A-fib ([place]) 10/14/2019   Atrial fibrillation with rapid ventricular response ([place]) 10/13/2019   NSTEMI (non-ST elevated myocardial infarction) ([place]) 10/13/2019   Acute diarrhea 10/13/2019   Normocytic anemia 10/13/2019   Diverticulitis sigmoid colon recurrent    Prediabetes 09/21/2017   Persistent atrial fibrillation ([place]) 04/11/2017   Vitamin D deficiency 05/20/2016   History of pacemaker 05/20/2016   History of stroke/[place]  05/20/2016   Primary osteoarthritis of both hands 03/10/2016   DDD cervical spine 03/10/2016   Osteoarthritis of lumbar spine 03/10/2016   Chronic left shoulder pain 03/10/2016   Trochanteric bursitis of right hip 03/10/2016   Other fatigue 03/10/2016   Myalgia 03/10/2016   Chronic pain syndrome 03/10/2016   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   Typical atrial flutter (HCC)    PVC's (premature ventricular contractions) 01/30/2015  Pacemaker 04/30/2013   MGUS (monoclonal gammopathy of unknown significance) 02/17/2013   Orthostatic hypotension 04/11/2011   Long term current use of anticoagulant therapy 03/24/2011   S/P AVR  03/24/2011   Pleural effusion 03/24/2011   Hypothyroidism 11/22/2010   Atrial flutter ([place]) 08/19/2010   CHEST PAIN 04/06/2010   Mixed hyperlipidemia 12/08/2007   PRIMARY HYPERCOAGULABLE STATE 12/08/2007   Coronary artery disease with exertional angina ([place]) 12/08/2007   Coronary artery disease involving native coronary artery of native heart 12/08/2007   AORTIC STENOSIS/ INSUFFICIENCY, NON-RHEUMATIC 12/08/2007   Atrial fibrillation ([place]) 12/08/2007   Chronotropic incompetence with sinus node dysfunction ([place]) 12/08/2007    Past Medical History:  Diagnosis Date   Aortic stenosis    moderate aortic stenosis   Arthritis    Benign prostatic hypertrophy    Chronotropic incompetence with sinus node dysfunction (HCC)    Status post Guidant dual-mode, dual-pacing, dual-sensing  pacemaker   implantation now programmed to AAI with recent generator change.   Coronary artery disease    status post multiple prior percutaneous coronary interventions, microvascular angina per Dr Olevia Perches   Diverticulitis sigmoid colon recurrent    Heart murmur    Hypercoagulable state ([place])    chronically anticoagulated with coumadin   Hyperlipidemia    Hyperthyroidism    Hypothyroidism    Dr. Elyse Hsu   MGUS (monoclonal gammopathy of unknown significance) 02/17/2013   Paroxysmal atrial fibrillation ([place])    DR. Lia Foyer,    Prediabetes 09/21/2017   Stroke ([place])    1990    Family History  Problem Relation Age of Onset   Heart disease Brother        Twin brother has coronary disease and recent AVR for AS   CAD Brother    Atrial fibrillation Brother    Sarcoidosis Brother    Depression Daughter    Anorexia nervosa Daughter    Hypertension Son    Anesthesia problems Neg Hx    Hypotension Neg Hx    Malignant hyperthermia Neg Hx    Pseudochol deficiency Neg Hx    Past Surgical History:  Procedure Laterality Date   AORTIC VALVE REPLACEMENT  03/15/2011   Procedure:  AORTIC VALVE REPLACEMENT (AVR);  Surgeon: Gaye Pollack, MD;  Location: [facility];  Service: Open Heart Surgery;  Laterality: N/A;   APPENDECTOMY     CARDIAC CATHETERIZATION     11   CARDIOVERSION     CARDIOVERSION  04/15/2011   Procedure: CARDIOVERSION;  Surgeon: Loralie Champagne, MD;  Location: [place] [facility];  Service: Cardiovascular;  Laterality: N/A;   CARDIOVERSION N/A 09/11/2014   Procedure: CARDIOVERSION;  Surgeon: Sueanne Margarita, MD;  Location: [facility];  Service: Cardiovascular;  Laterality: N/A;   CARDIOVERSION N/A 06/27/2015   Procedure: CARDIOVERSION;  Surgeon: Thayer Headings, MD;  Location: [facility];  Service: Cardiovascular;  Laterality: N/A;   CARDIOVERSION N/A 07/04/2015   Procedure: CARDIOVERSION;  Surgeon: Evans Lance, MD;  Location: [place];  Service: Cardiovascular;  Laterality: N/A;   CARDIOVERSION N/A 04/13/2017   Procedure: CARDIOVERSION;  Surgeon: Sueanne Margarita, MD;  Location: [facility] ENDOSCOPY;  Service: Cardiovascular;  Laterality: N/A;   COLONOSCOPY     CORONARY STENT INTERVENTION N/A 10/15/2019   Procedure: CORONARY STENT INTERVENTION;  Surgeon: Jettie Booze, MD;  Location: [place] CV LAB;  Service: Cardiovascular;  Laterality: N/A;   EP IMPLANTABLE DEVICE N/A 06/16/2015   Procedure: Pacemaker Implant;  Surgeon: Evans Lance, MD;  Location: [place] CV LAB;  Service: Cardiovascular;  Laterality: N/A;   hemrrhoidectomy     LEFT AND RIGHT HEART CATHETERIZATION WITH CORONARY ANGIOGRAM Bilateral 02/01/2011   Procedure: LEFT AND RIGHT HEART CATHETERIZATION WITH CORONARY ANGIOGRAM;  Surgeon: Hillary Bow, MD;  Location[facility] CATH LAB;  Service: Cardiovascular;  Laterality: Bilateral;   LEFT HEART CATH AND CORONARY ANGIOGRAPHY N/A 10/15/2019   Procedure: LEFT HEART CATH AND CORONARY ANGIOGRAPHY;  Surgeon: Jettie Booze, MD;  Location: [place] CV LAB;  Service: Cardiovascular;  Laterality: N/A;   MAZE  03/15/2011   Procedure:  MAZE;  Surgeon: Gaye Pollack, MD;  Location: [place];  Service: Open Heart Surgery;  Laterality: N/A;   PACEMAKER INSERTION  1991   Guidant PPM, most recent Generator Change by Dr Olevia Perches was 08/22/06   RIGHT/LEFT HEART CATH AND CORONARY ANGIOGRAPHY N/A 07/06/2016   Procedure: Right/Left Heart Cath and Coronary Angiography;  Surgeon: Sherren Mocha, MD;  Location: [place] CV LAB;  Service: Cardiovascular;  Laterality: N/A;   TEE WITHOUT CARDIOVERSION  04/15/2011   Procedure: TRANSESOPHAGEAL ECHOCARDIOGRAM (TEE);  Surgeon: Loralie Champagne, MD;  Location: [place];  Service: Cardiovascular;  Laterality: N/A;   TEE WITHOUT CARDIOVERSION N/A 09/11/2014   Procedure: TRANSESOPHAGEAL ECHOCARDIOGRAM (TEE);  Surgeon: Sueanne Margarita, MD;  Location: [facility] ENDOSCOPY;  Service: Cardiovascular;  Laterality: N/A;   Social History   Social History Narrative   Married to [place]. Has grown children   Retired Horticulturist, commercial MD      Never smoker no alcohol      Immunization History  Administered Date(s) Administered   Influenza Split 12/30/2017   PFIZER SARS-COV-2 Vaccination 03/13/2019, 04/02/2019, 11/01/2019     Objective: Vital Signs: BP (!) 143/85 (BP Location: Left Arm, Patient Position: Sitting, Cuff Size: Small)    Pulse 81    Ht 6' 1.5" (1.867 m)    Wt 220 lb (99.8 kg)    BMI 28.63 kg/m    Physical Exam Vitals and nursing note reviewed.  Constitutional:      Appearance: He is well-developed.  HENT:     Head: Normocephalic and atraumatic.  Eyes:     Conjunctiva/sclera: Conjunctivae normal.     Pupils: Pupils are equal, round, and reactive to light.  Cardiovascular:     Rate and Rhythm: Normal rate and regular rhythm.     Heart sounds: Normal heart sounds.  Pulmonary:     Effort: Pulmonary effort is normal.     Breath sounds: Normal breath sounds.  Abdominal:     General: Bowel sounds are normal.     Palpations: Abdomen is soft.  Musculoskeletal:     Cervical back: Normal range of  motion and neck supple.  Skin:    General: Skin is warm and dry.     Capillary Refill: Capillary refill takes less than 2 seconds.  Neurological:     Mental Status: He is alert and oriented to person, place, and time.  Psychiatric:        Behavior: Behavior normal.      Musculoskeletal Exam: He has good range of motion of cervical spine with discomfort.  Shoulder joint abduction was limited to 90 degrees.  Elbow joints with good range of motion.  He has PIP and DIP thickening with no synovitis.  Hip joints and knee joints with good range of motion.  He had no tenderness over ankle joints or MTPs.  CDAI Exam: CDAI Score: -- Patient Global: --; Provider Global: -- Swollen: --; Tender: -- Joint Exam 12/04/2019  No joint exam has been documented for this visit   There is currently no information documented on the homunculus. Go to the Rheumatology activity and complete the homunculus joint exam.  Investigation: No additional findings.  Imaging: DG Chest Port 1 View  Result Date: 11/06/2019 CLINICAL DATA:  Chest pain EXAM: PORTABLE CHEST 1 VIEW COMPARISON:  10/13/2019 FINDINGS: The heart size and mediastinal contours are within normal limits. Both lungs are clear. The visualized skeletal structures are unremarkable. Median sternotomy, prosthetic aortic valve and left atrial appendage clip. Bilateral pacemakers. IMPRESSION: No active disease. Electronically Signed   By: Ulyses Jarred M.D.   On: 11/06/2019 02:59   CUP PACEART REMOTE DEVICE CHECK  Result Date: 11/09/2019 Scheduled remote reviewed. Normal device function.  SVT/NST events show SVT/conducted Aflutter, programmed DDIR On toprol and eliquis. Next remote 91 days. JM   Recent Labs: Lab Results  Component Value Date   WBC 6.9 11/26/2019   HGB 10.9 (L) 11/26/2019   PLT 218 11/26/2019   NA 143 11/26/2019   K 4.7 11/26/2019   CL 105 11/26/2019   CO2 26 11/26/2019   GLUCOSE 90 11/26/2019   BUN 12 11/26/2019   CREATININE  0.88 11/26/2019   BILITOT 0.8 10/14/2019   ALKPHOS 81 10/14/2019   AST 30 10/14/2019   ALT 19 10/14/2019   PROT 6.1 (L) 10/14/2019   ALBUMIN 3.3 (L) 10/14/2019   CALCIUM 9.2 11/26/2019   GFRAA 92 11/26/2019    Speciality Comments: No specialty comments available.  Procedures:  No procedures performed Allergies: Contrast media [iodinated diagnostic agents], Gadolinium derivatives, and Metrizamide   Assessment / Plan:     Visit Diagnoses: Primary osteoarthritis of both shoulders-he has limited range of motion of his shoulder joints with discomfort.  A handout on shoulder joint exercises was given.  Primary osteoarthritis of both hands-he has severe osteoarthritis with decreased range of motion of PIPs and DIPs.  Exercises were demonstrated and also handout was given.  Trochanteric bursitis, right hip-doing better currently.  DDD (degenerative disc disease), cervical-he has discomfort with range of motion of his cervical spine.  C-spine exercises were demonstrated in the office.  Proper posture was discussed.  DDD (degenerative disc disease), lumbar-he has chronic discomfort.  Other chronic pain-he is has chronic pain due to severe osteoarthritis.  He is trying to reduce hydrocodone to the minimal amount as tolerated.  I have decreased his dose of hydrocodone to 5 -325 mg 3 times daily as needed.  We will check UDS today.  Myofascial pain-he continues to have generalized pain and discomfort.  Other fatigue-he has been experiencing extreme fatigue.  History of pacemaker  S/P AVR  Paroxysmal atrial fibrillation (HCC)-he was recently hospitalized for ventricular tachycardia.  He had's adjustment to his medications.  Coronary artery disease with exertional angina (HCC)-he had recent stent placement.  History of stroke  Primary hypercoagulable state [facility])  History of diverticulitis - August 2021 treated with Cipro   MGUS (monoclonal gammopathy of unknown significance)-followed  by hematology.  Depression-he feels he is more depressed.  He does not want to take any antidepressants.  Educated about COVID-19 virus infection-he is fully vaccinated against COVID-19.  Use of mask, social distancing and hand hygiene was discussed.  I also discussed the use of monoclonal antibody infusion in case he develops COVID-19 infection.  Orders: Orders Placed This Encounter  Procedures   DRUG MONITOR, PANEL 5, W/CONF, URINE   Meds ordered this encounter  Medications   HYDROcodone-acetaminophen (NORCO/VICODIN) 5-325 MG tablet  Sig: Take 1 tablet by mouth 3 (three) times daily as needed for moderate pain.    Dispense:  90 tablet    Refill:  0    Face-to-face time spent with patient was 33 minutes.  Follow-Up Instructions: Return in about 6 months (around 06/03/2020) for Osteoarthritis,DDD,MFP.   Bo Merino, MD  Note - This record has been created using Editor, commissioning.  Chart creation errors have been sought, but may not always  have been located. Such creation errors do not reflect on  the standard of medical care.

## 2019-12-04 NOTE — Patient Instructions (Signed)
Hand Exercises Hand exercises can be helpful for almost anyone. These exercises can strengthen the hands, improve flexibility and movement, and increase blood flow to the hands. These results can make work and daily tasks easier. Hand exercises can be especially helpful for people who have joint pain from arthritis or have nerve damage from overuse (carpal tunnel syndrome). These exercises can also help people who have injured a hand. Exercises Most of these hand exercises are gentle stretching and motion exercises. It is usually safe to do them often throughout the day. Warming up your hands before exercise may help to reduce stiffness. You can do this with gentle massage or by placing your hands in warm water for 10-15 minutes. It is normal to feel some stretching, pulling, tightness, or mild discomfort as you begin new exercises. This will gradually improve. Stop an exercise right away if you feel sudden, severe pain or your pain gets worse. Ask your health care provider which exercises are best for you. Knuckle bend or "claw" fist 1. Stand or sit with your arm, hand, and all five fingers pointed straight up. Make sure to keep your wrist straight during the exercise. 2. Gently bend your fingers down toward your palm until the tips of your fingers are touching the top of your palm. Keep your big knuckle straight and just bend the small knuckles in your fingers. 3. Hold this position for __________ seconds. 4. Straighten (extend) your fingers back to the starting position. Repeat this exercise 5-10 times with each hand. Full finger fist 1. Stand or sit with your arm, hand, and all five fingers pointed straight up. Make sure to keep your wrist straight during the exercise. 2. Gently bend your fingers into your palm until the tips of your fingers are touching the middle of your palm. 3. Hold this position for __________ seconds. 4. Extend your fingers back to the starting position, stretching every  joint fully. Repeat this exercise 5-10 times with each hand. Straight fist 1. Stand or sit with your arm, hand, and all five fingers pointed straight up. Make sure to keep your wrist straight during the exercise. 2. Gently bend your fingers at the big knuckle, where your fingers meet your hand, and the middle knuckle. Keep the knuckle at the tips of your fingers straight and try to touch the bottom of your palm. 3. Hold this position for __________ seconds. 4. Extend your fingers back to the starting position, stretching every joint fully. Repeat this exercise 5-10 times with each hand. Tabletop 1. Stand or sit with your arm, hand, and all five fingers pointed straight up. Make sure to keep your wrist straight during the exercise. 2. Gently bend your fingers at the big knuckle, where your fingers meet your hand, as far down as you can while keeping the small knuckles in your fingers straight. Think of forming a tabletop with your fingers. 3. Hold this position for __________ seconds. 4. Extend your fingers back to the starting position, stretching every joint fully. Repeat this exercise 5-10 times with each hand. Finger spread 1. Place your hand flat on a table with your palm facing down. Make sure your wrist stays straight as you do this exercise. 2. Spread your fingers and thumb apart from each other as far as you can until you feel a gentle stretch. Hold this position for __________ seconds. 3. Bring your fingers and thumb tight together again. Hold this position for __________ seconds. Repeat this exercise 5-10 times with each hand.   Making circles 1. Stand or sit with your arm, hand, and all five fingers pointed straight up. Make sure to keep your wrist straight during the exercise. 2. Make a circle by touching the tip of your thumb to the tip of your index finger. 3. Hold for __________ seconds. Then open your hand wide. 4. Repeat this motion with your thumb and each finger on your  hand. Repeat this exercise 5-10 times with each hand. Thumb motion 1. Sit with your forearm resting on a table and your wrist straight. Your thumb should be facing up toward the ceiling. Keep your fingers relaxed as you move your thumb. 2. Lift your thumb up as high as you can toward the ceiling. Hold for __________ seconds. 3. Bend your thumb across your palm as far as you can, reaching the tip of your thumb for the small finger (pinkie) side of your palm. Hold for __________ seconds. Repeat this exercise 5-10 times with each hand. Grip strengthening  1. Hold a stress ball or other soft ball in the middle of your hand. 2. Slowly increase the pressure, squeezing the ball as much as you can without causing pain. Think of bringing the tips of your fingers into the middle of your palm. All of your finger joints should bend when doing this exercise. 3. Hold your squeeze for __________ seconds, then relax. Repeat this exercise 5-10 times with each hand. Contact a health care provider if:  Your hand pain or discomfort gets much worse when you do an exercise.  Your hand pain or discomfort does not improve within 2 hours after you exercise. If you have any of these problems, stop doing these exercises right away. Do not do them again unless your health care provider says that you can. Get help right away if:  You develop sudden, severe hand pain or swelling. If this happens, stop doing these exercises right away. Do not do them again unless your health care provider says that you can. This information is not intended to replace advice given to you by your health care provider. Make sure you discuss any questions you have with your health care provider. Document Revised: 06/08/2018 Document Reviewed: 02/16/2018 Elsevier Patient Education  [place]. Shoulder Exercises Ask your health care provider which exercises are safe for you. Do exercises exactly as told by your health care provider  and adjust them as directed. It is normal to feel mild stretching, pulling, tightness, or discomfort as you do these exercises. Stop right away if you feel sudden pain or your pain gets worse. Do not begin these exercises until told by your health care provider. Stretching exercises External rotation and abduction This exercise is sometimes called corner stretch. This exercise rotates your arm outward (external rotation) and moves your arm out from your body (abduction). 1. Stand in a doorway with one of your feet slightly in front of the other. This is called a staggered stance. If you cannot reach your forearms to the door frame, stand facing a corner of a room. 2. Choose one of the following positions as told by your health care provider: ? Place your hands and forearms on the door frame above your head. ? Place your hands and forearms on the door frame at the height of your head. ? Place your hands on the door frame at the height of your elbows. 3. Slowly move your weight onto your front foot until you feel a stretch across your chest and in the front  of your shoulders. Keep your head and chest upright and keep your abdominal muscles tight. 4. Hold for __________ seconds. 5. To release the stretch, shift your weight to your back foot. Repeat __________ times. Complete this exercise __________ times a day. Extension, standing 1. Stand and hold a broomstick, a cane, or a similar object behind your back. ? Your hands should be a little wider than shoulder width apart. ? Your palms should face away from your back. 2. Keeping your elbows straight and your shoulder muscles relaxed, move the stick away from your body until you feel a stretch in your shoulders (extension). ? Avoid shrugging your shoulders while you move the stick. Keep your shoulder blades tucked down toward the middle of your back. 3. Hold for __________ seconds. 4. Slowly return to the starting position. Repeat __________ times.  Complete this exercise __________ times a day. Range-of-motion exercises Pendulum  1. Stand near a wall or a surface that you can hold onto for balance. 2. Bend at the waist and let your left / right arm hang straight down. Use your other arm to support you. Keep your back straight and do not lock your knees. 3. Relax your left / right arm and shoulder muscles, and move your hips and your trunk so your left / right arm swings freely. Your arm should swing because of the motion of your body, not because you are using your arm or shoulder muscles. 4. Keep moving your hips and trunk so your arm swings in the following directions, as told by your health care provider: ? Side to side. ? Forward and backward. ? In clockwise and counterclockwise circles. 5. Continue each motion for __________ seconds, or for as long as told by your health care provider. 6. Slowly return to the starting position. Repeat __________ times. Complete this exercise __________ times a day. Shoulder flexion, standing  1. Stand and hold a broomstick, a cane, or a similar object. Place your hands a little more than shoulder width apart on the object. Your left / right hand should be palm up, and your other hand should be palm down. 2. Keep your elbow straight and your shoulder muscles relaxed. Push the stick up with your healthy arm to raise your left / right arm in front of your body, and then over your head until you feel a stretch in your shoulder (flexion). ? Avoid shrugging your shoulder while you raise your arm. Keep your shoulder blade tucked down toward the middle of your back. 3. Hold for __________ seconds. 4. Slowly return to the starting position. Repeat __________ times. Complete this exercise __________ times a day. Shoulder abduction, standing 1. Stand and hold a broomstick, a cane, or a similar object. Place your hands a little more than shoulder width apart on the object. Your left / right hand should be palm  up, and your other hand should be palm down. 2. Keep your elbow straight and your shoulder muscles relaxed. Push the object across your body toward your left / right side. Raise your left / right arm to the side of your body (abduction) until you feel a stretch in your shoulder. ? Do not raise your arm above shoulder height unless your health care provider tells you to do that. ? If directed, raise your arm over your head. ? Avoid shrugging your shoulder while you raise your arm. Keep your shoulder blade tucked down toward the middle of your back. 3. Hold for __________ seconds. 4. Slowly return  to the starting position. Repeat __________ times. Complete this exercise __________ times a day. Internal rotation  1. Place your left / right hand behind your back, palm up. 2. Use your other hand to dangle an exercise band, a towel, or a similar object over your shoulder. Grasp the band with your left / right hand so you are holding on to both ends. 3. Gently pull up on the band until you feel a stretch in the front of your left / right shoulder. The movement of your arm toward the center of your body is called internal rotation. ? Avoid shrugging your shoulder while you raise your arm. Keep your shoulder blade tucked down toward the middle of your back. 4. Hold for __________ seconds. 5. Release the stretch by letting go of the band and lowering your hands. Repeat __________ times. Complete this exercise __________ times a day. Strengthening exercises External rotation  1. Sit in a stable chair without armrests. 2. Secure an exercise band to a stable object at elbow height on your left / right side. 3. Place a soft object, such as a folded towel or a small pillow, between your left / right upper arm and your body to move your elbow about 4 inches (10 cm) away from your side. 4. Hold the end of the exercise band so it is tight and there is no slack. 5. Keeping your elbow pressed against the soft  object, slowly move your forearm out, away from your abdomen (external rotation). Keep your body steady so only your forearm moves. 6. Hold for __________ seconds. 7. Slowly return to the starting position. Repeat __________ times. Complete this exercise __________ times a day. Shoulder abduction  1. Sit in a stable chair without armrests, or stand up. 2. Hold a __________ weight in your left / right hand, or hold an exercise band with both hands. 3. Start with your arms straight down and your left / right palm facing in, toward your body. 4. Slowly lift your left / right hand out to your side (abduction). Do not lift your hand above shoulder height unless your health care provider tells you that this is safe. ? Keep your arms straight. ? Avoid shrugging your shoulder while you do this movement. Keep your shoulder blade tucked down toward the middle of your back. 5. Hold for __________ seconds. 6. Slowly lower your arm, and return to the starting position. Repeat __________ times. Complete this exercise __________ times a day. Shoulder extension 1. Sit in a stable chair without armrests, or stand up. 2. Secure an exercise band to a stable object in front of you so it is at shoulder height. 3. Hold one end of the exercise band in each hand. Your palms should face each other. 4. Straighten your elbows and lift your hands up to shoulder height. 5. Step back, away from the secured end of the exercise band, until the band is tight and there is no slack. 6. Squeeze your shoulder blades together as you pull your hands down to the sides of your thighs (extension). Stop when your hands are straight down by your sides. Do not let your hands go behind your body. 7. Hold for __________ seconds. 8. Slowly return to the starting position. Repeat __________ times. Complete this exercise __________ times a day. Shoulder row 1. Sit in a stable chair without armrests, or stand up. 2. Secure an exercise band  to a stable object in front of you so it is at waist  height. 3. Hold one end of the exercise band in each hand. Position your palms so that your thumbs are facing the ceiling (neutral position). 4. Bend each of your elbows to a 90-degree angle (right angle) and keep your upper arms at your sides. 5. Step back until the band is tight and there is no slack. 6. Slowly pull your elbows back behind you. 7. Hold for __________ seconds. 8. Slowly return to the starting position. Repeat __________ times. Complete this exercise __________ times a day. Shoulder press-ups  1. Sit in a stable chair that has armrests. Sit upright, with your feet flat on the floor. 2. Put your hands on the armrests so your elbows are bent and your fingers are pointing forward. Your hands should be about even with the sides of your body. 3. Push down on the armrests and use your arms to lift yourself off the chair. Straighten your elbows and lift yourself up as much as you comfortably can. ? Move your shoulder blades down, and avoid letting your shoulders move up toward your ears. ? Keep your feet on the ground. As you get stronger, your feet should support less of your body weight as you lift yourself up. 4. Hold for __________ seconds. 5. Slowly lower yourself back into the chair. Repeat __________ times. Complete this exercise __________ times a day. Wall push-ups  1. Stand so you are facing a stable wall. Your feet should be about one arm-length away from the wall. 2. Lean forward and place your palms on the wall at shoulder height. 3. Keep your feet flat on the floor as you bend your elbows and lean forward toward the wall. 4. Hold for __________ seconds. 5. Straighten your elbows to push yourself back to the starting position. Repeat __________ times. Complete this exercise __________ times a day. This information is not intended to replace advice given to you by your health care provider. Make sure you discuss any  questions you have with your health care provider. Document Revised: 06/09/2018 Document Reviewed: 03/17/2018 Elsevier Patient Education  [place]. Cervical Strain and Sprain Rehab Ask your health care provider which exercises are safe for you. Do exercises exactly as told by your health care provider and adjust them as directed. It is normal to feel mild stretching, pulling, tightness, or discomfort as you do these exercises. Stop right away if you feel sudden pain or your pain gets worse. Do not begin these exercises until told by your health care provider. Stretching and range-of-motion exercises Cervical side bending  6. Using good posture, sit on a stable chair or stand up. 7. Without moving your shoulders, slowly tilt your left / right ear to your shoulder until you feel a stretch in the opposite side neck muscles. You should be looking straight ahead. 8. Hold for __________ seconds. 9. Repeat with the other side of your neck. Repeat __________ times. Complete this exercise __________ times a day. Cervical rotation  5. Using good posture, sit on a stable chair or stand up. 6. Slowly turn your head to the side as if you are looking over your left / right shoulder. ? Keep your eyes level with the ground. ? Stop when you feel a stretch along the side and the back of your neck. 7. Hold for __________ seconds. 8. Repeat this by turning to your other side. Repeat __________ times. Complete this exercise __________ times a day. Thoracic extension and pectoral stretch 7. Roll a towel or a small blanket  so it is about 4 inches (10 cm) in diameter. 8. Lie down on your back on a firm surface. 9. Put the towel lengthwise, under your spine in the middle of your back. It should not be under your shoulder blades. The towel should line up with your spine from your middle back to your lower back. 10. Put your hands behind your head and let your elbows fall out to your sides. 11. Hold for  __________ seconds. Repeat __________ times. Complete this exercise __________ times a day. Strengthening exercises Isometric upper cervical flexion 5. Lie on your back with a thin pillow behind your head and a small rolled-up towel under your neck. 6. Gently tuck your chin toward your chest and nod your head down to look toward your feet. Do not lift your head off the pillow. 7. Hold for __________ seconds. 8. Release the tension slowly. Relax your neck muscles completely before you repeat this exercise. Repeat __________ times. Complete this exercise __________ times a day. Isometric cervical extension  5. Stand about 6 inches (15 cm) away from a wall, with your back facing the wall. 6. Place a soft object, about 6-8 inches (15-20 cm) in diameter, between the back of your head and the wall. A soft object could be a small pillow, a ball, or a folded towel. 7. Gently tilt your head back and press into the soft object. Keep your jaw and forehead relaxed. 8. Hold for __________ seconds. 9. Release the tension slowly. Relax your neck muscles completely before you repeat this exercise. Repeat __________ times. Complete this exercise __________ times a day. Posture and body mechanics Body mechanics refers to the movements and positions of your body while you do your daily activities. Posture is part of body mechanics. Good posture and healthy body mechanics can help to relieve stress in your body's tissues and joints. Good posture means that your spine is in its natural S-curve position (your spine is neutral), your shoulders are pulled back slightly, and your head is not tipped forward. The following are general guidelines for applying improved posture and body mechanics to your everyday activities. Sitting  6. When sitting, keep your spine neutral and keep your feet flat on the floor. Use a footrest, if necessary, and keep your thighs parallel to the floor. Avoid rounding your shoulders, and avoid  tilting your head forward. 7. When working at a desk or a computer, keep your desk at a height where your hands are slightly lower than your elbows. Slide your chair under your desk so you are close enough to maintain good posture. 8. When working at a computer, place your monitor at a height where you are looking straight ahead and you do not have to tilt your head forward or downward to look at the screen. Standing   When standing, keep your spine neutral and keep your feet about hip-width apart. Keep a slight bend in your knees. Your ears, shoulders, and hips should line up.  When you do a task in which you stand in one place for a long time, place one foot up on a stable object that is 2-4 inches (5-10 cm) high, such as a footstool. This helps keep your spine neutral. Resting When lying down and resting, avoid positions that are most painful for you. Try to support your neck in a neutral position. You can use a contour pillow or a small rolled-up towel. Your pillow should support your neck but not push on it. This information  is not intended to replace advice given to you by your health care provider. Make sure you discuss any questions you have with your health care provider. Document Revised: 06/07/2018 Document Reviewed: 11/16/2017 Elsevier Patient Education  [place].

## 2019-12-05 DIAGNOSIS — R339 Retention of urine, unspecified: Secondary | ICD-10-CM | POA: Insufficient documentation

## 2019-12-05 DIAGNOSIS — E032 Hypothyroidism due to medicaments and other exogenous substances: Secondary | ICD-10-CM | POA: Insufficient documentation

## 2019-12-05 DIAGNOSIS — T462X1A Poisoning by other antidysrhythmic drugs, accidental (unintentional), initial encounter: Secondary | ICD-10-CM | POA: Insufficient documentation

## 2019-12-05 DIAGNOSIS — K648 Other hemorrhoids: Secondary | ICD-10-CM | POA: Insufficient documentation

## 2019-12-05 DIAGNOSIS — R5381 Other malaise: Secondary | ICD-10-CM | POA: Insufficient documentation

## 2019-12-05 DIAGNOSIS — F4322 Adjustment disorder with anxiety: Secondary | ICD-10-CM | POA: Insufficient documentation

## 2019-12-06 LAB — DRUG MONITOR, PANEL 5, W/CONF, URINE
Alphahydroxyalprazolam: NEGATIVE ng/mL (ref <25)
Alphahydroxymidazolam: NEGATIVE ng/mL (ref <50)
Alphahydroxytriazolam: NEGATIVE ng/mL (ref <50)
Aminoclonazepam: NEGATIVE ng/mL (ref <25)
Amobarbital: NEGATIVE ng/mL (ref <100)
Amphetamines: NEGATIVE ng/mL (ref <500)
Barbiturates: POSITIVE ng/mL — AB (ref <300)
Benzodiazepines: POSITIVE ng/mL — AB (ref <100)
Butalbital: 1294 ng/mL — ABNORMAL HIGH (ref <100)
Cocaine Metabolite: NEGATIVE ng/mL (ref <150)
Codeine: NEGATIVE ng/mL (ref <50)
Creatinine: 74.7 mg/dL
Hydrocodone: 160 ng/mL — ABNORMAL HIGH (ref <50)
Hydromorphone: 263 ng/mL — ABNORMAL HIGH (ref <50)
Hydroxyethylflurazepam: NEGATIVE ng/mL (ref <50)
Lorazepam: 356 ng/mL — ABNORMAL HIGH (ref <50)
Marijuana Metabolite: NEGATIVE ng/mL (ref <20)
Methadone Metabolite: NEGATIVE ng/mL (ref <100)
Morphine: NEGATIVE ng/mL (ref <50)
Nordiazepam: NEGATIVE ng/mL (ref <50)
Norhydrocodone: 613 ng/mL — ABNORMAL HIGH (ref <50)
Opiates: POSITIVE ng/mL — AB (ref <100)
Oxazepam: NEGATIVE ng/mL (ref <50)
Oxidant: NEGATIVE ug/mL
Oxycodone: NEGATIVE ng/mL (ref <100)
Pentobarbital: NEGATIVE ng/mL (ref <100)
Phenobarbital: NEGATIVE ng/mL (ref <100)
Secobarbital: NEGATIVE ng/mL (ref <100)
Temazepam: NEGATIVE ng/mL (ref <50)
pH: 7.2 (ref 4.5–9.0)

## 2019-12-06 LAB — DM TEMPLATE

## 2019-12-07 NOTE — Progress Notes (Signed)
Labs consistent with treatment.

## 2019-12-13 ENCOUNTER — Telehealth: Payer: Self-pay | Admitting: Rheumatology

## 2019-12-13 MED FILL — dilTIAZem HCL 30 MG TABS: 30 | 22 days supply | Qty: 90 | Fill #1

## 2019-12-13 MED FILL — POTASSIUM CHLORIDE CRYS ER: 20 | 30 days supply | Qty: 60 | Fill #3

## 2019-12-13 NOTE — Telephone Encounter (Signed)
Spoke with [facility] at [facility]. She states the last Hydrocodone prescription was filled on 11/26/2019 and mailed to patient's home on 11/29/2019. Spoke with patient and he states he received the prescription but was advised to decrease dose at his last appointment. Patient advised per Dr. Estanislado Pandy that will be with his next refill which will be due at the end of this month. Patient expressed understanding.

## 2019-12-13 NOTE — Telephone Encounter (Signed)
Patient calling because [name] never received his RX for Hydrocodone that was suppose to be sent in on 12/04/2019. Patient's wife going to pharmacy today at 2:30 pm, please get rx in by then.  Also, PLEASE CALL PATIENT to let him know rx was sent in.

## 2019-12-18 ENCOUNTER — Encounter: Payer: Self-pay | Admitting: Internal Medicine

## 2019-12-18 ENCOUNTER — Ambulatory Visit: Payer: Medicare Other | Admitting: Internal Medicine

## 2019-12-18 ENCOUNTER — Ambulatory Visit (INDEPENDENT_AMBULATORY_CARE_PROVIDER_SITE_OTHER): Payer: Medicare Other | Admitting: Internal Medicine

## 2019-12-18 ENCOUNTER — Other Ambulatory Visit: Payer: Self-pay

## 2019-12-18 ENCOUNTER — Other Ambulatory Visit: Payer: Self-pay | Admitting: Internal Medicine

## 2019-12-18 VITALS — BP 130/84 | HR 80 | Ht 73.5 in | Wt 220.0 lb

## 2019-12-18 VITALS — BP 126/78 | HR 75 | Ht 73.5 in | Wt 220.0 lb

## 2019-12-18 DIAGNOSIS — I495 Sick sinus syndrome: Secondary | ICD-10-CM | POA: Diagnosis not present

## 2019-12-18 DIAGNOSIS — E2740 Unspecified adrenocortical insufficiency: Secondary | ICD-10-CM

## 2019-12-18 DIAGNOSIS — I209 Angina pectoris, unspecified: Secondary | ICD-10-CM

## 2019-12-18 DIAGNOSIS — I4819 Other persistent atrial fibrillation: Secondary | ICD-10-CM

## 2019-12-18 DIAGNOSIS — Z950 Presence of cardiac pacemaker: Secondary | ICD-10-CM | POA: Diagnosis not present

## 2019-12-18 LAB — CUP PACEART INCLINIC DEVICE CHECK
Date Time Interrogation Session: 20211019171030
Implantable Lead Implant Date: 20170417
Implantable Lead Implant Date: 20170417
Implantable Lead Location: 753859
Implantable Lead Location: 753860
Implantable Lead Model: 7740
Implantable Lead Model: 7741
Implantable Lead Serial Number: 662696
Implantable Lead Serial Number: 751382
Implantable Pulse Generator Implant Date: 20170417
Lead Channel Impedance Value: 681 Ohm
Lead Channel Impedance Value: 684 Ohm
Lead Channel Pacing Threshold Amplitude: 0.5 V
Lead Channel Pacing Threshold Amplitude: 0.8 V
Lead Channel Pacing Threshold Pulse Width: 0.4 ms
Lead Channel Pacing Threshold Pulse Width: 0.5 ms
Lead Channel Sensing Intrinsic Amplitude: 15.4 mV
Lead Channel Sensing Intrinsic Amplitude: 4.1 mV
Lead Channel Setting Pacing Amplitude: 2.5 V
Lead Channel Setting Pacing Amplitude: 2.5 V
Lead Channel Setting Pacing Pulse Width: 0.4 ms
Lead Channel Setting Sensing Sensitivity: 2.5 mV
Pulse Gen Serial Number: 718418

## 2019-12-18 MED ORDER — HYDROCORTISONE NA SUCCINATE PF 100 MG IJ SOLR: INTRAMUSCULAR | 99 refills | Status: DC

## 2019-12-18 MED ORDER — HYDROCORTISONE NA SUCCINATE PF 100 MG IJ SOLR
INTRAMUSCULAR | 99 refills | Status: DC
Start: 2019-12-18 — End: 2019-12-18

## 2019-12-18 MED FILL — SOLU-CORTEF (PF) 100 MG VIA: 100 | 1 days supply | Qty: 1 | Fill #0

## 2019-12-18 NOTE — Progress Notes (Signed)
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 He is a pleasant 85 yo man with multiple medical problems including persistent atrial fib/left atrial flutter, sinus node dysfunction, CAD, who prseneted several weeks ago with a RVR after he had self reduced his beta blocker medications. He had been on 200 mg daily and went down to 100. His rates increased and he had chest pain and ruled in for a NSTEMI, underwent left heart cath and PCI of the RCA. He was noted to have mild-mod LV dysfunction. He has done well in the interim now that he is back on toprol 100 bid. He denies chest pain. He notes symptoms of neuropathy. His orthostasis is a little better.  Allergies  Allergen Reactions  . Contrast Media [Iodinated Diagnostic Agents] Hives  . Gadolinium Derivatives Hives  . Metrizamide Hives     Current Outpatient Medications  Medication Sig Dispense Refill  . amLODipine (NORVASC) 5 MG tablet Take 1 tablet (5 mg total) by mouth at bedtime as needed. 90 tablet 1  . apixaban (ELIQUIS) 5 MG TABS tablet Take 1 tablet (5 mg total) by mouth 2 (two) times daily. 180 tablet 3  . bisacodyl (DULCOLAX) 5 MG EC tablet Take 5 mg by mouth as needed (constipation).     . butalbital-acetaminophen-caffeine (FIORICET, ESGIC) 50-325-40 MG per tablet Take 1 tablet by mouth 2 (two) times daily as needed for headache (osteo-arthritis pain).     . Cholecalciferol (VITAMIN D3) 2000 units capsule Take 1,000 Units by mouth daily.     . clopidogrel (PLAVIX) 75 MG tablet Take 1 tablet (75 mg total) by mouth daily with breakfast. 60 tablet 0  . Coenzyme Q10 100 MG capsule Take 100 mg by mouth daily.     . diclofenac Sodium (VOLTAREN) 1 % GEL Apply 2 g topically 2 (two) times daily as needed (knee pain).    Marland Kitchen diltiazem (CARDIZEM) 30 MG tablet Take 1 tablet (30 mg total) by mouth every 6 (six) hours as needed (for HRs greater than 150). 90 tablet 11  . doxazosin (CARDURA) 4 MG tablet Take 4 mg by mouth at bedtime.     Marland Kitchen  ezetimibe (ZETIA) 10 MG tablet Take 1 tablet (10 mg total) by mouth daily. 90 tablet 3  . HYDROcodone-acetaminophen (NORCO/VICODIN) 5-325 MG tablet Take 1 tablet by mouth 3 (three) times daily as needed for moderate pain. 90 tablet 0  . hydrocortisone sodium succinate (SOLU-CORTEF) 100 MG SOLR injection Inject 100 mL in the muscle as needed when you cannot take Prednisone by mouth 1 each prn  . LORazepam (ATIVAN) 1 MG tablet Take 1 tablet (1 mg total) by mouth at bedtime. May also take 0.5 tablets (0.5 mg total) daily as needed for anxiety. 45 tablet 0  . magnesium oxide (MAG-OX) 400 MG tablet Take 400 mg by mouth daily.    . metoprolol succinate (TOPROL-XL) 100 MG 24 hr tablet Take 1 tablet (100 mg total) by mouth 2 (two) times daily. Take with or immediately following a meal. 60 tablet 0  . nitroGLYCERIN (NITROSTAT) 0.4 MG SL tablet Place 1 tablet (0.4 mg total) under the tongue every 5 (five) minutes as needed for chest pain. 25 tablet 5  . OVER THE COUNTER MEDICATION Apply 1 patch topically daily as needed (neck and shoulder pain). Chinese pain patch    . pantoprazole (PROTONIX) 40 MG tablet Take 1 tablet (40 mg total) by mouth daily. 90 tablet 3  . polyethylene glycol (MIRALAX / GLYCOLAX)  17 g packet Take 17 g by mouth daily as needed for mild constipation.     . potassium chloride SA (KLOR-CON) 20 MEQ tablet Take 20 mEq by mouth daily.    . predniSONE (DELTASONE) 5 MG tablet Take 5 mg by mouth daily with breakfast.    . rosuvastatin (CRESTOR) 20 MG tablet Take 1 tablet (20 mg total) by mouth at bedtime. 90 tablet 3   No current facility-administered medications for this visit.     Past Medical History:  Diagnosis Date  . Aortic stenosis    moderate aortic stenosis  . Arthritis   . Benign prostatic hypertrophy   . Chronotropic incompetence with sinus node dysfunction (HCC)    Status post Guidant dual-mode, dual-pacing, dual-sensing  pacemaker   implantation now programmed to AAI with  recent generator change.  . Coronary artery disease    status post multiple prior percutaneous coronary interventions, microvascular angina per Dr Olevia Perches  . Diverticulitis sigmoid colon recurrent   . Heart murmur   . Hypercoagulable state ([place])    chronically anticoagulated with coumadin  . Hyperlipidemia   . Hyperthyroidism   . Hypothyroidism    Dr. Elyse Hsu  . MGUS (monoclonal gammopathy of unknown significance) 02/17/2013  . Paroxysmal atrial fibrillation ([place])    DR. Lia Foyer,   . Prediabetes 09/21/2017  . Stroke ([place])    1990    ROS:   All systems reviewed and negative except as noted in the HPI.   Past Surgical History:  Procedure Laterality Date  . AORTIC VALVE REPLACEMENT  03/15/2011   Procedure: AORTIC VALVE REPLACEMENT (AVR);  Surgeon: Gaye Pollack, MD;  Location: [place];  Service: Open Heart Surgery;  Laterality: N/A;  . APPENDECTOMY    . CARDIAC CATHETERIZATION     11  . CARDIOVERSION    . CARDIOVERSION  04/15/2011   Procedure: CARDIOVERSION;  Surgeon: Loralie Champagne, MD;  Location: [place];  Service: Cardiovascular;  Laterality: N/A;  . CARDIOVERSION N/A 09/11/2014   Procedure: CARDIOVERSION;  Surgeon: Sueanne Margarita, MD;  Location: [facility] ENDOSCOPY;  Service: Cardiovascular;  Laterality: N/A;  . CARDIOVERSION N/A 06/27/2015   Procedure: CARDIOVERSION;  Surgeon: Thayer Headings, MD;  Location: [facility] ENDOSCOPY;  Service: Cardiovascular;  Laterality: N/A;  . CARDIOVERSION N/A 07/04/2015   Procedure: CARDIOVERSION;  Surgeon: Evans Lance, MD;  Location: [place];  Service: Cardiovascular;  Laterality: N/A;  . CARDIOVERSION N/A 04/13/2017   Procedure: CARDIOVERSION;  Surgeon: Sueanne Margarita, MD;  Location: [facility] ENDOSCOPY;  Service: Cardiovascular;  Laterality: N/A;  . COLONOSCOPY    . CORONARY STENT INTERVENTION N/A 10/15/2019   Procedure: CORONARY STENT INTERVENTION;  Surgeon: Jettie Booze, MD;  Location: [place] CV LAB;  Service: Cardiovascular;   Laterality: N/A;  . EP IMPLANTABLE DEVICE N/A 06/16/2015   Procedure: Pacemaker Implant;  Surgeon: Evans Lance, MD;  Location: [place] CV LAB;  Service: Cardiovascular;  Laterality: N/A;  . hemrrhoidectomy    . LEFT AND RIGHT HEART CATHETERIZATION WITH CORONARY ANGIOGRAM Bilateral 02/01/2011   Procedure: LEFT AND RIGHT HEART CATHETERIZATION WITH CORONARY ANGIOGRAM;  Surgeon: Hillary Bow, MD;  Location: [facility] CATH LAB;  Service: Cardiovascular;  Laterality: Bilateral;  . LEFT HEART CATH AND CORONARY ANGIOGRAPHY N/A 10/15/2019   Procedure: LEFT HEART CATH AND CORONARY ANGIOGRAPHY;  Surgeon: Jettie Booze, MD;  Location: [place] CV LAB;  Service: Cardiovascular;  Laterality: N/A;  . MAZE  03/15/2011   Procedure: MAZE;  Surgeon: Gaye Pollack, MD;  Location: [facility] OR;  Service: Open Heart Surgery;  Laterality: N/A;  . PACEMAKER INSERTION  1991   Guidant PPM, most recent Generator Change by Dr Olevia Perches was 08/22/06  . RIGHT/LEFT HEART CATH AND CORONARY ANGIOGRAPHY N/A 07/06/2016   Procedure: Right/Left Heart Cath and Coronary Angiography;  Surgeon: Sherren Mocha, MD;  Location: [place] CV LAB;  Service: Cardiovascular;  Laterality: N/A;  . TEE WITHOUT CARDIOVERSION  04/15/2011   Procedure: TRANSESOPHAGEAL ECHOCARDIOGRAM (TEE);  Surgeon: Loralie Champagne, MD;  Location: [place];  Service: Cardiovascular;  Laterality: N/A;  . TEE WITHOUT CARDIOVERSION N/A 09/11/2014   Procedure: TRANSESOPHAGEAL ECHOCARDIOGRAM (TEE);  Surgeon: Sueanne Margarita, MD;  Location: [facility] ENDOSCOPY;  Service: Cardiovascular;  Laterality: N/A;     Family History  Problem Relation Age of Onset  . Heart disease Brother        Twin brother has coronary disease and recent AVR for AS  . CAD Brother   . Atrial fibrillation Brother   . Sarcoidosis Brother   . Depression Daughter   . Anorexia nervosa Daughter   . Hypertension Son   . Anesthesia problems Neg Hx   . Hypotension Neg Hx   . Malignant hyperthermia Neg  Hx   . Pseudochol deficiency Neg Hx      Social History   Socioeconomic History  . Marital status: Married    Spouse name: Baker Janus   . Number of children: 3  . Years of education: Not on file  . Highest education level: Not on file  Occupational History  . Occupation: Retired    Comment: Physician  Tobacco Use  . Smoking status: Never Smoker  . Smokeless tobacco: Never Used  Vaping Use  . Vaping Use: Never used  Substance and Sexual Activity  . Alcohol use: No    Alcohol/week: 0.0 standard drinks  . Drug use: No  . Sexual activity: Not Currently  Other Topics Concern  . Not on file  Social History Narrative   Married to [place]. Has grown children   Retired Horticulturist, commercial MD      Never smoker no alcohol      Social Determinants of Radio broadcast assistant Strain:   . Difficulty of Paying Living Expenses: Not on file  Food Insecurity:   . Worried About Charity fundraiser in the Last Year: Not on file  . Ran Out of Food in the Last Year: Not on file  Transportation Needs:   . Lack of Transportation (Medical): Not on file  . Lack of Transportation (Non-Medical): Not on file  Physical Activity:   . Days of Exercise per Week: Not on file  . Minutes of Exercise per Session: Not on file  Stress:   . Feeling of Stress : Not on file  Social Connections:   . Frequency of Communication with Friends and Family: Not on file  . Frequency of Social Gatherings with Friends and Family: Not on file  . Attends Religious Services: Not on file  . Active Member of Clubs or Organizations: Not on file  . Attends Archivist Meetings: Not on file  . Marital Status: Not on file  Intimate Partner Violence:   . Fear of Current or Ex-Partner: Not on file  . Emotionally Abused: Not on file  . Physically Abused: Not on file  . Sexually Abused: Not on file     BP 126/78   Pulse 75   Ht 6' 1.5" (1.867 m)   Wt 220 lb (99.8 kg)  SpO2 95%   BMI 28.63 kg/m   Physical  Exam:  Well appearing NAD HEENT: Unremarkable Neck:  No JVD, no thyromegally Lymphatics:  No adenopathy Back:  No CVA tenderness Lungs:  Clear with no wheezes HEART:  Regular rate rhythm, no murmurs, no rubs, no clicks Abd:  soft, positive bowel sounds, no organomegally, no rebound, no guarding Ext:  2 plus pulses, no edema, no cyanosis, no clubbing Skin:  No rashes no nodules Neuro:  CN II through XII intact, motor grossly intact  DEVICE  Normal device function.  See PaceArt for details.   Assess/Plan: 1. Atrial fib/flutter - his rates are controlled. As time has gone on, his atrial rates have gone down. 2. Sinus node dysfunction - he is asymptomatic, s/p PPM 3. PPM - his boston Sci DDD PM is working normally. 4. CAD - he denies additional angina. I think that the key will be keeping his HR controlled.   Carleene Overlie Jeanee Fabre,MD

## 2019-12-18 NOTE — Progress Notes (Signed)
Patient ID: Kevin Buff, MD, male   DOB: 1934-03-30, 85 y.o.   MRN: [id number]   This visit occurred during the SARS-CoV-2 public health emergency.  Safety protocols were in place, including screening questions prior to the visit, additional usage of staff PPE, and extensive cleaning of exam room while observing appropriate contact time as indicated for disinfecting solutions.   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 Elyse Hsu (who is retiring), for evaluation for suspicion for adrenal insufficiency.  He is here with Maudie Mercury, RN, who offers part of the history including PMH, medication clarifications, symptoms.  Patient has a significant history of osteoarthritis, myofascial pain, trochanteric bursitis of the right hip, DDD, severe foraminal stenosis of the cervical spine (per CT of the spine 06/10/2017).  He has pain in all joints.  She is seen by Dr. Estanislado Pandy and treated with Norco 5/325 (he takes a tablet 2-3 times a day) with good relief.  He has been taking this for many years.  She is also on chronic prednisone treatment-5 mg daily now (previously 2-5 mg daily) - for last ~2 years.  He was admitted 10/2019 with diverticulitis, A. fib with RVR, he also had to have a cardiac stent placed at that time.  He complains of chronic fatigue and weakness and cannot stand for longer periods of time.  For the last 2-3 mo: malaise, flu-like sxs, low tolerance to activity (tachycardia, orthostatic hypotension alternating with HTN).  No h/o chronic Megace, po ketoconazole, phenytoin, rifampin, fluconazole use. No excess use of NSAIDs. No h/o generalized infections or HIV. No IVDA. No h/o head injury. No h/o malignancy.  Pt c/o: - + mm and joint aches - + fatigue and weakness - + dizziness - + HAs - + palpitations - no weight loss - no nausea - no vomiting - no abdominal pain - no syncopal episodes - + more depression especially related to previous  hospitalization- refused antidepressants  No h/o hyponatremia or hyperkalemia.   Chemistry      Component Value Date/Time   NA 143 11/26/2019 1616   NA 141 04/05/2013 1159   K 4.7 11/26/2019 1616   K 3.8 04/05/2013 1159   CL 105 11/26/2019 1616   CO2 26 11/26/2019 1616   CO2 30 (H) 04/05/2013 1159   BUN 12 11/26/2019 1616   BUN 18.3 04/05/2013 1159   CREATININE 0.88 11/26/2019 1616   CREATININE 0.96 08/10/2018 1521   CREATININE 0.88 11/26/2013 1142   CREATININE 1.1 04/05/2013 1159      Component Value Date/Time   CALCIUM 9.2 11/26/2019 1616   CALCIUM 9.7 04/05/2013 1159   ALKPHOS 81 10/14/2019 0208   ALKPHOS 52 04/05/2013 1159   AST 30 10/14/2019 0208   AST 25 08/10/2018 1521   AST 19 04/05/2013 1159   ALT 19 10/14/2019 0208   ALT 19 08/10/2018 1521   ALT 16 04/05/2013 1159   BILITOT 0.8 10/14/2019 0208   BILITOT 0.4 06/20/2019 1239   BILITOT 0.4 08/10/2018 1521   BILITOT 0.45 04/05/2013 1159     Reviewed imaging tests: CT abdomen (10/18/2019): No adrenal abnormality mentioned CT abdomen (09/12/2018): Normal adrenals  He has a history of amiodarone induced thyrotoxicosis and hypothyroidism.  She was on low-dose levothyroxine in the past.  This was stopped in 2016.  TFTs remains normal: Lab Results  Component Value Date   TSH 2.231 10/14/2019   TSH 2.750 09/02/2017   TSH 3.278 04/11/2017  TSH 2.090 07/02/2016   TSH 2.203 09/10/2014   TSH 3.898 11/26/2013   TSH 2.60 10/04/2013   TSH 5.30 10/16/2012   TSH 8.92 (H) 05/03/2012   TSH 5.22 12/01/2011   TSH 7.16 (H) 09/07/2011   TSH 6.56 (H) 06/23/2011   TSH 7.97 (H) 05/21/2011   TSH 7.34 (H) 04/12/2011   TSH 8.793 (H) 03/11/2011   TSH 20.82 (H) 01/29/2011   TSH 89.218 (H) 11/16/2010   TSH 0.37 08/21/2010   Of note, vitamin B12 was normal 09/2019; magnesium level also normal.  Pt. also has a history of:  Aortic stenosis - s/p AVR  CHF   CAD, h/o NSTEMI, s/p multiple PCI's, s/p RCA stent 10/2019, on  Plavix, Eliquis  A. fib with history of RVR-most recent episode 10/2019 -has pacemaker, on diltiazem, Toprol-XL  Old left cerebellar infarction-on CT angio 2014  HTN  POTS  Hypercoagulable state  HL-on Crestor 20  MGUS  Prediabetes -latest HbA1c 6.2% on 10/16/2019  Diverticulitis episode in 10/2019-on Cipro  Anemia  He resides at [facility] facility.  ROS: Constitutional: + see HPI;  no subjective hyperthermia/hypothermia Eyes: no blurry vision, no xerophthalmia ENT: no sore throat, no nodules palpated in neck, no dysphagia/odynophagia, no hoarseness Cardiovascular: no CP/SOB/+ palpitations/no leg swelling Respiratory: no cough/SOB Gastrointestinal: no N/V/D/C Musculoskeletal: + both: muscle/joint aches Skin: no rashes Neurological: no tremors/numbness/tingling/+ dizziness, + HAs Psychiatric: + depression/no anxiety  Past Medical History:  Diagnosis Date  . Aortic stenosis    moderate aortic stenosis  . Arthritis   . Benign prostatic hypertrophy   . 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  . Coronary artery disease    status post multiple prior percutaneous coronary interventions, microvascular angina per Dr Olevia Perches  . Diverticulitis sigmoid colon recurrent   . Heart murmur   . Hypercoagulable state ([place])    chronically anticoagulated with coumadin  . Hyperlipidemia   . Hyperthyroidism   . Hypothyroidism    Dr. Elyse Hsu  . MGUS (monoclonal gammopathy of unknown significance) 02/17/2013  . Paroxysmal atrial fibrillation ([place])    DR. Lia Foyer,   . Prediabetes 09/21/2017  . Stroke [facility])    1990   Past Surgical History:  Procedure Laterality Date  . AORTIC VALVE REPLACEMENT  03/15/2011   Procedure: AORTIC VALVE REPLACEMENT (AVR);  Surgeon: Gaye Pollack, MD;  Location: [place];  Service: Open Heart Surgery;   Laterality: N/A;  . APPENDECTOMY    . CARDIAC CATHETERIZATION     11  . CARDIOVERSION    . CARDIOVERSION  04/15/2011   Procedure: CARDIOVERSION;  Surgeon: Loralie Champagne, MD;  Location: [place];  Service: Cardiovascular;  Laterality: N/A;  . CARDIOVERSION N/A 09/11/2014   Procedure: CARDIOVERSION;  Surgeon: Sueanne Margarita, MD;  Location: [facility] ENDOSCOPY;  Service: Cardiovascular;  Laterality: N/A;  . CARDIOVERSION N/A 06/27/2015   Procedure: CARDIOVERSION;  Surgeon: Thayer Headings, MD;  Location: [facility] ENDOSCOPY;  Service: Cardiovascular;  Laterality: N/A;  . CARDIOVERSION N/A 07/04/2015   Procedure: CARDIOVERSION;  Surgeon: Evans Lance, MD;  Location: [place];  Service: Cardiovascular;  Laterality: N/A;  . CARDIOVERSION N/A 04/13/2017   Procedure: CARDIOVERSION;  Surgeon: Sueanne Margarita, MD;  Location: [facility] ENDOSCOPY;  Service: Cardiovascular;  Laterality: N/A;  . COLONOSCOPY    . CORONARY STENT INTERVENTION N/A 10/15/2019   Procedure: CORONARY STENT INTERVENTION;  Surgeon: Jettie Booze,  MD;  Location: [place] CV LAB;  Service: Cardiovascular;  Laterality: N/A;  . EP IMPLANTABLE DEVICE N/A 06/16/2015   Procedure: Pacemaker Implant;  Surgeon: Evans Lance, MD;  Location: [place] CV LAB;  Service: Cardiovascular;  Laterality: N/A;  . hemrrhoidectomy    . LEFT AND RIGHT HEART CATHETERIZATION WITH CORONARY ANGIOGRAM Bilateral 02/01/2011   Procedure: LEFT AND RIGHT HEART CATHETERIZATION WITH CORONARY ANGIOGRAM;  Surgeon: Hillary Bow, MD;  Location: [facility] CATH LAB;  Service: Cardiovascular;  Laterality: Bilateral;  . LEFT HEART CATH AND CORONARY ANGIOGRAPHY N/A 10/15/2019   Procedure: LEFT HEART CATH AND CORONARY ANGIOGRAPHY;  Surgeon: Jettie Booze, MD;  Location: [place] CV LAB;  Service: Cardiovascular;  Laterality: N/A;  . MAZE  03/15/2011   Procedure: MAZE;  Surgeon: Gaye Pollack, MD;  Location: [place];  Service: Open Heart Surgery;  Laterality: N/A;  .  PACEMAKER INSERTION  1991   Guidant PPM, most recent Generator Change by Dr Olevia Perches was 08/22/06  . RIGHT/LEFT HEART CATH AND CORONARY ANGIOGRAPHY N/A 07/06/2016   Procedure: Right/Left Heart Cath and Coronary Angiography;  Surgeon: Sherren Mocha, MD;  Location: [place] CV LAB;  Service: Cardiovascular;  Laterality: N/A;  . TEE WITHOUT CARDIOVERSION  04/15/2011   Procedure: TRANSESOPHAGEAL ECHOCARDIOGRAM (TEE);  Surgeon: Loralie Champagne, MD;  Location: [place];  Service: Cardiovascular;  Laterality: N/A;  . TEE WITHOUT CARDIOVERSION N/A 09/11/2014   Procedure: TRANSESOPHAGEAL ECHOCARDIOGRAM (TEE);  Surgeon: Sueanne Margarita, MD;  Location: [facility] ENDOSCOPY;  Service: Cardiovascular;  Laterality: N/A;   Social History   Socioeconomic History  . Marital status: Married    Spouse name: Baker Janus   . Number of children: 3  . Years of education: Not on file  . Highest education level: Not on file  Occupational History  . Occupation: Retired    Comment: Physician  Tobacco Use  . Smoking status: Never Smoker  . Smokeless tobacco: Never Used  Vaping Use  . Vaping Use: Never used  Substance and Sexual Activity  . Alcohol use: No    Alcohol/week: 0.0 standard drinks  . Drug use: No  . Sexual activity: Not Currently  Other Topics Concern  . Not on file  Social History Narrative   Married to [place]ard. Has grown children   Retired Horticulturist, commercial MD      Never smoker no alcohol      Social Determinants of Radio broadcast assistant Strain:   . Difficulty of Paying Living Expenses: Not on file  Food Insecurity:   . Worried About Charity fundraiser in the Last Year: Not on file  . Ran Out of Food in the Last Year: Not on file  Transportation Needs:   . Lack of Transportation (Medical): Not on file  . Lack of Transportation (Non-Medical): Not on file  Physical Activity:   . Days of Exercise per Week: Not on file  . Minutes of Exercise per Session: Not on file  Stress:   . Feeling of Stress : Not  on file  Social Connections:   . Frequency of Communication with Friends and Family: Not on file  . Frequency of Social Gatherings with Friends and Family: Not on file  . Attends Religious Services: Not on file  . Active Member of Clubs or Organizations: Not on file  . Attends Archivist Meetings: Not on file  . Marital Status: Not on file  Intimate Partner Violence:   . Fear of Current or Ex-Partner: Not  on file  . Emotionally Abused: Not on file  . Physically Abused: Not on file  . Sexually Abused: Not on file   Current Outpatient Medications on File Prior to Visit  Medication Sig Dispense Refill  . amLODipine (NORVASC) 5 MG tablet Take 1 tablet (5 mg total) by mouth at bedtime as needed. 90 tablet 1  . apixaban (ELIQUIS) 5 MG TABS tablet Take 1 tablet (5 mg total) by mouth 2 (two) times daily. 180 tablet 3  . bisacodyl (DULCOLAX) 5 MG EC tablet Take 5 mg by mouth as needed (constipation).     . butalbital-acetaminophen-caffeine (FIORICET, ESGIC) 50-325-40 MG per tablet Take 1 tablet by mouth 2 (two) times daily as needed for headache (osteo-arthritis pain).     . Cholecalciferol (VITAMIN D3) 2000 units capsule Take 1,000 Units by mouth daily.     . clopidogrel (PLAVIX) 75 MG tablet Take 1 tablet (75 mg total) by mouth daily with breakfast. 60 tablet 0  . Coenzyme Q10 100 MG capsule Take 100 mg by mouth daily.     . diclofenac Sodium (VOLTAREN) 1 % GEL Apply 2 g topically 2 (two) times daily as needed (knee pain).    Marland Kitchen diltiazem (CARDIZEM) 30 MG tablet Take 1 tablet (30 mg total) by mouth every 6 (six) hours as needed (for HRs greater than 150). 90 tablet 11  . doxazosin (CARDURA) 4 MG tablet Take 4 mg by mouth at bedtime.     Marland Kitchen ezetimibe (ZETIA) 10 MG tablet Take 1 tablet (10 mg total) by mouth daily. 90 tablet 3  . HYDROcodone-acetaminophen (NORCO/VICODIN) 5-325 MG tablet Take 1 tablet by mouth 3 (three) times daily as needed for moderate pain. 90 tablet 0  . LORazepam  (ATIVAN) 1 MG tablet Take 1 tablet (1 mg total) by mouth at bedtime. May also take 0.5 tablets (0.5 mg total) daily as needed for anxiety. 45 tablet 0  . magnesium oxide (MAG-OX) 400 MG tablet Take 400 mg by mouth daily.    . metoprolol succinate (TOPROL-XL) 100 MG 24 hr tablet Take 1 tablet (100 mg total) by mouth 2 (two) times daily. Take with or immediately following a meal. 60 tablet 0  . nitroGLYCERIN (NITROSTAT) 0.4 MG SL tablet Place 1 tablet (0.4 mg total) under the tongue every 5 (five) minutes as needed for chest pain. 25 tablet 5  . OVER THE COUNTER MEDICATION Apply 1 patch topically daily as needed (neck and shoulder pain). Chinese pain patch    . pantoprazole (PROTONIX) 40 MG tablet Take 1 tablet (40 mg total) by mouth daily. 90 tablet 3  . polyethylene glycol (MIRALAX / GLYCOLAX) 17 g packet Take 17 g by mouth daily as needed for mild constipation.     . potassium chloride SA (KLOR-CON) 20 MEQ tablet Take 20 mEq by mouth daily.    . predniSONE (DELTASONE) 5 MG tablet Take 5 mg by mouth daily with breakfast.    . rosuvastatin (CRESTOR) 20 MG tablet Take 1 tablet (20 mg total) by mouth at bedtime. 90 tablet 3   No current facility-administered medications on file prior to visit.   Allergies  Allergen Reactions  . Contrast Media [Iodinated Diagnostic Agents] Hives  . Gadolinium Derivatives Hives  . Metrizamide Hives   Family History  Problem Relation Age of Onset  . Heart disease Brother        Twin brother has coronary disease and recent AVR for AS  . CAD Brother   . Atrial fibrillation Brother   .  Sarcoidosis Brother   . Depression Daughter   . Anorexia nervosa Daughter   . Hypertension Son   . Anesthesia problems Neg Hx   . Hypotension Neg Hx   . Malignant hyperthermia Neg Hx   . Pseudochol deficiency Neg Hx    PE: BP 130/84   Pulse 80   Ht 6' 1.5" (1.867 m)   Wt 220 lb (99.8 kg)   SpO2 97%   BMI 28.63 kg/m  Wt Readings from Last 3 Encounters:  12/18/19 220 lb  (99.8 kg)  12/04/19 220 lb (99.8 kg)  11/28/19 220 lb (99.8 kg)   Constitutional: Slightly overweight, in NAD Eyes: PERRLA, EOMI, no exophthalmos ENT: moist mucous membranes, no thyromegaly, no cervical lymphadenopathy Cardiovascular: RRR, No MRG Respiratory: CTA B Gastrointestinal: abdomen soft, NT, ND, BS+ Musculoskeletal: no deformities, strength intact in all 4 Skin: moist, warm, no rashes; no dark discoloration of skin Neurological: no tremor with outstretched hands, DTR normal in all 4  ASSESSMENT: 1.  Central adrenal insufficiency  PLAN:  1.  Central adrenal insufficiency (AI) -Patient with long history of opiate use and also with 2-year history of prednisone use for osteoarthritis. -He is on 5 mg of prednisone but has been on fluctuating doses in the past, between 2 and 5 mg daily. -He is aware of the possible side effect of steroids and would be very interested to come off, but he does not feel this is feasible.  Also, he does not think he can come off his opiate, which can contribute to adrenal insufficiency. -we discussed that after 2 years of continuous Prednisone use, he most likely has at least a degree of central (tertiary) adrenal insufficiency.  Prednisone has a long half-life and is quite efficient in suppressing the hypothalamic-pituitary-adrenal axis.  However, since he will not be able to stop prednisone, the focus is not necessarily to check him for adrenal insufficiency (since this can only be done after starting prednisone), but to optimize his steroid intake.  I explained that 5 mg of prednisone is a physiologic dose and we can continue with this for now, however, he will need to increase the dose (2 tablets) in case he develops a fever or a systemic illness that puts his body under increased stress.  Also, I underlined the need of getting the a steroid in the muscle or in the vein if he cannot take tablets by mouth or he loses them in stool (diarrhea).  I sent a  prescription for hydrocortisone to his pharmacy.  His nurse, Maudie Mercury, knows how to administer this. -Patient inquires about Solu-Medrol and we discussed about the fact that this is the active substance of prednisone and if there is any concern about liver damage in the future, he needs to get the active substance, rather than prednisone.  However, there is no indication of liver dysfunction now. -I also advised him to get a med alert bracelet mentioning "adrenal insufficiency".  Given application leaflet. -I explained that his fatigue may be related to adrenal insufficiency but he has many other possible reasons for this including: Deconditioning, constant pain, heart disease, beta-blockers, insomnia, depression, etc. -I plan to see him back in 6 months but advised him to stay in touch with me with any questions or concerns.  Philemon Kingdom, MD PhD [facility]

## 2019-12-18 NOTE — Patient Instructions (Addendum)
Please continue Prednisone 5 mg in am.  - You absolutely need to take the steroid medication every day and not skip doses.  - Please double the dose if you have a fever or another systemic illness, for the duration of the fever.  - If you cannot take anything by mouth (vomiting) or you have severe diarrhea so that you eliminate the steroid pills in your stool, please make sure that you get the steroid in the vein or in the muscle instead.  - Please try to get a MedAlert bracelet or pendant indicating: "Adrenal insufficiency".  Please come back for a follow-up appointment in 6 months.

## 2019-12-18 NOTE — Patient Instructions (Addendum)
Medication Instructions:  Your physician recommends that you continue on your current medications as directed. Please refer to the Current Medication list given to you today.  Labwork: None ordered.  Testing/Procedures: None ordered.  Follow-Up: Your physician wants you to follow-up in: 6 months with Dr. Lovena Le.   You will receive a reminder letter in the mail two months in advance. If you don't receive a letter, please call our office to schedule the follow-up appointment.  Remote monitoring is used to monitor your Pacemaker from home. This monitoring reduces the number of office visits required to check your device to one time per year. It allows [place] to keep an eye on the functioning of your device to ensure it is working properly. You are scheduled for a device check from home on 02/08/2020. You may send your transmission at any time that day. If you have a wireless device, the transmission will be sent automatically. After your physician reviews your transmission, you will receive a postcard with your next transmission date.  Any Other Special Instructions Will Be Listed Below (If Applicable).  If you need a refill on your cardiac medications before your next appointment, please call your pharmacy.

## 2019-12-24 ENCOUNTER — Other Ambulatory Visit: Payer: Self-pay | Admitting: Rheumatology

## 2019-12-24 DIAGNOSIS — H25813 Combined forms of age-related cataract, bilateral: Secondary | ICD-10-CM | POA: Diagnosis not present

## 2019-12-24 MED ORDER — HYDROCODONE-ACETAMINOPHEN 5-325 MG PO TABS
1.0000 | ORAL_TABLET | Freq: Three times a day (TID) | ORAL | 0 refills | Status: DC | PRN
Start: 2019-12-24 — End: 2020-01-25

## 2019-12-24 MED FILL — HYDROCODON-APAP 5-325: 5-325 | 30 days supply | Qty: 90 | Fill #0

## 2019-12-24 NOTE — Telephone Encounter (Signed)
Patient calling because he needs his refill on Hydrocodone 90 day supply. Pharmacy does not have a rx for him. Please resend rx to [facility], and call patient to inform when done.

## 2019-12-24 NOTE — Telephone Encounter (Signed)
Patient advised prescription sent to the pharmacy.  

## 2019-12-24 NOTE — Telephone Encounter (Signed)
Last Visit:12/04/2019 Next Visit:06/04/2020 UDS: 12/04/2019 Narc Agreement:12/04/2019  Last Fill: 11/24/2019  Okay to refill Hydrocodone?

## 2019-12-26 ENCOUNTER — Other Ambulatory Visit: Payer: Self-pay | Admitting: Internal Medicine

## 2019-12-26 DIAGNOSIS — Z950 Presence of cardiac pacemaker: Secondary | ICD-10-CM

## 2019-12-26 MED FILL — LORazepam 1 MG TABS: 1 | 30 days supply | Qty: 45 | Fill #0

## 2019-12-26 NOTE — Telephone Encounter (Signed)
RX last filled on 11/28/2019. Treatment agreement on file from 11/28/2019

## 2020-01-11 ENCOUNTER — Inpatient Hospital Stay: Payer: Medicare Other | Attending: Oncology

## 2020-01-11 ENCOUNTER — Other Ambulatory Visit: Payer: Self-pay

## 2020-01-11 DIAGNOSIS — D472 Monoclonal gammopathy: Secondary | ICD-10-CM

## 2020-01-11 LAB — CMP (CANCER CENTER ONLY)
ALT: 20 U/L (ref 0–44)
AST: 26 U/L (ref 15–41)
Albumin: 3.9 g/dL (ref 3.5–5.0)
Alkaline Phosphatase: 101 U/L (ref 38–126)
Anion gap: 6 (ref 5–15)
BUN: 11 mg/dL (ref 8–23)
CO2: 29 mmol/L (ref 22–32)
Calcium: 9.3 mg/dL (ref 8.9–10.3)
Chloride: 106 mmol/L (ref 98–111)
Creatinine: 1 mg/dL (ref 0.61–1.24)
GFR, Estimated: >60 mL/min (ref >60)
Glucose, Bld: 114 mg/dL — ABNORMAL HIGH (ref 70–99)
Potassium: 4.2 mmol/L (ref 3.5–5.1)
Sodium: 141 mmol/L (ref 135–145)
Total Bilirubin: 0.6 mg/dL (ref 0.3–1.2)
Total Protein: 7.3 g/dL (ref 6.5–8.1)

## 2020-01-11 LAB — CBC WITH DIFFERENTIAL (CANCER CENTER ONLY)
Abs Immature Granulocytes: 0.02 10*3/uL (ref 0.00–0.07)
Basophils Absolute: 0 10*3/uL (ref 0.0–0.1)
Basophils Relative: 1 %
Eosinophils Absolute: 0.2 10*3/uL (ref 0.0–0.5)
Eosinophils Relative: 4 %
HCT: 32.5 % — ABNORMAL LOW (ref 39.0–52.0)
Hemoglobin: 10.6 g/dL — ABNORMAL LOW (ref 13.0–17.0)
Immature Granulocytes: 0 %
Lymphocytes Relative: 12 %
Lymphs Abs: 0.7 10*3/uL (ref 0.7–4.0)
MCH: 29 pg (ref 26.0–34.0)
MCHC: 32.6 g/dL (ref 30.0–36.0)
MCV: 88.8 fL (ref 80.0–100.0)
Monocytes Absolute: 0.3 10*3/uL (ref 0.1–1.0)
Monocytes Relative: 6 %
Neutro Abs: 4.4 10*3/uL (ref 1.7–7.7)
Neutrophils Relative %: 77 %
Platelet Count: 187 10*3/uL (ref 150–400)
RBC: 3.66 MIL/uL — ABNORMAL LOW (ref 4.22–5.81)
RDW: 14.3 % (ref 11.5–15.5)
WBC Count: 5.7 10*3/uL (ref 4.0–10.5)
nRBC: 0 % (ref 0.0–0.2)

## 2020-01-12 LAB — IGM: IgM (Immunoglobulin M), Srm: 917 mg/dL — ABNORMAL HIGH (ref 15–143)

## 2020-01-14 LAB — PROTEIN ELECTROPHORESIS, SERUM
A/G Ratio: 1.2 (ref 0.7–1.7)
Albumin ELP: 3.7 g/dL (ref 2.9–4.4)
Alpha-1-Globulin: 0.2 g/dL (ref 0.0–0.4)
Alpha-2-Globulin: 0.7 g/dL (ref 0.4–1.0)
Beta Globulin: 0.7 g/dL (ref 0.7–1.3)
Gamma Globulin: 1.4 g/dL (ref 0.4–1.8)
Globulin, Total: 3 g/dL (ref 2.2–3.9)
M-Spike, %: 0.6 g/dL — ABNORMAL HIGH
Total Protein ELP: 6.7 g/dL (ref 6.0–8.5)

## 2020-01-14 LAB — KAPPA/LAMBDA LIGHT CHAINS
Kappa free light chain: 26.9 mg/L — ABNORMAL HIGH (ref 3.3–19.4)
Kappa, lambda light chain ratio: 0.86 (ref 0.26–1.65)
Lambda free light chains: 31.4 mg/L — ABNORMAL HIGH (ref 5.7–26.3)

## 2020-01-16 ENCOUNTER — Encounter (HOSPITAL_COMMUNITY): Payer: Self-pay | Admitting: Cardiovascular Disease

## 2020-01-16 ENCOUNTER — Other Ambulatory Visit (HOSPITAL_COMMUNITY): Payer: Medicare Other

## 2020-01-16 ENCOUNTER — Telehealth: Payer: Self-pay | Admitting: *Deleted

## 2020-01-16 NOTE — Telephone Encounter (Signed)
Patient called concerned about his labs collected on 01/11/2020 w/Hgb 10.6. Asking if he needs iron or is there a problem with his bone marrow? Does admit to being in hospital x 2 three months ago w/cardiac issues and had multiple blood draws. Requesting a call from Dr. Benay Spice to discuss.

## 2020-01-18 ENCOUNTER — Other Ambulatory Visit (HOSPITAL_COMMUNITY): Payer: Medicare Other

## 2020-01-18 ENCOUNTER — Other Ambulatory Visit: Payer: Self-pay

## 2020-01-18 ENCOUNTER — Telehealth: Payer: Self-pay | Admitting: Oncology

## 2020-01-18 ENCOUNTER — Ambulatory Visit: Payer: Medicare Other | Admitting: Cardiovascular Disease

## 2020-01-18 ENCOUNTER — Ambulatory Visit (HOSPITAL_COMMUNITY): Payer: Medicare Other | Attending: Internal Medicine

## 2020-01-18 ENCOUNTER — Other Ambulatory Visit: Payer: Self-pay | Admitting: *Deleted

## 2020-01-18 DIAGNOSIS — D472 Monoclonal gammopathy: Secondary | ICD-10-CM

## 2020-01-18 DIAGNOSIS — I25119 Atherosclerotic heart disease of native coronary artery with unspecified angina pectoris: Secondary | ICD-10-CM

## 2020-01-18 DIAGNOSIS — D649 Anemia, unspecified: Secondary | ICD-10-CM

## 2020-01-18 LAB — ECHOCARDIOGRAM COMPLETE
AR max vel: 1.28 cm2
AV Area VTI: 1.27 cm2
AV Area mean vel: 1.3 cm2
AV Mean grad: 9 mmHg
AV Peak grad: 16.9 mmHg
Ao pk vel: 2.06 m/s
Area-P 1/2: 3.85 cm2
S' Lateral: 3.9 cm

## 2020-01-18 NOTE — Progress Notes (Signed)
Dr. Benay Spice reviewed recent labs w/patient and has ordered CBC,ferritin in 2-3 months. Orders placed and scheduling message sent.

## 2020-01-18 NOTE — Telephone Encounter (Signed)
Scheduled appointment per 11/19 sch msg. Called patient, no answer. Left message for pt. With appointment date and time.

## 2020-01-23 ENCOUNTER — Telehealth: Payer: Self-pay | Admitting: Cardiovascular Disease

## 2020-01-23 NOTE — Telephone Encounter (Signed)
[name] is calling requesting to speak with Dr. Burt Knack in regards to his Echo results. Please advise.

## 2020-01-23 NOTE — Telephone Encounter (Signed)
Per Dr. Burt Knack, informed patient that his echo is stable and no changes are to be made based on results. Also informed him he may finish his Plavix (he has a couple days left) and then start ASA 81 mg daily.  He requests a call from Dr. Burt Knack next week to discuss further. He was grateful for call.

## 2020-01-25 ENCOUNTER — Other Ambulatory Visit: Payer: Self-pay | Admitting: Internal Medicine

## 2020-01-25 ENCOUNTER — Other Ambulatory Visit: Payer: Self-pay | Admitting: Rheumatology

## 2020-01-25 DIAGNOSIS — Z950 Presence of cardiac pacemaker: Secondary | ICD-10-CM

## 2020-01-25 MED FILL — NITROGLYCERIN 0.4 MG TAB SL: 0.4 | 5 days supply | Qty: 25 | Fill #1

## 2020-01-25 MED FILL — HYDROCODON-APAP 5-325: 5-325 | 30 days supply | Qty: 90 | Fill #0

## 2020-01-25 MED FILL — ROSUVASTATIN CALCIUM 20 MG: 20 | 90 days supply | Qty: 90 | Fill #0

## 2020-01-28 ENCOUNTER — Other Ambulatory Visit (HOSPITAL_COMMUNITY): Payer: Self-pay | Admitting: Urology

## 2020-01-28 MED FILL — DOXAZOSIN MESYLATE 4 MG TAB: 4 | 90 days supply | Qty: 90 | Fill #0

## 2020-01-28 MED FILL — LORazepam 1 MG TABS: 1 | 30 days supply | Qty: 45 | Fill #0

## 2020-01-28 NOTE — Telephone Encounter (Signed)
RX last filled 12/26/2019   Treatment agreement on file from 11/28/2019

## 2020-01-29 NOTE — Telephone Encounter (Signed)
Reviewed results in detail with Dr Velora Heckler last night.

## 2020-02-04 MED FILL — BUTALB-ACETAMIN-CAFF 50-325: 50-325-40 | 90 days supply | Qty: 180 | Fill #1

## 2020-02-08 ENCOUNTER — Ambulatory Visit (INDEPENDENT_AMBULATORY_CARE_PROVIDER_SITE_OTHER): Payer: Medicare Other

## 2020-02-08 DIAGNOSIS — I495 Sick sinus syndrome: Secondary | ICD-10-CM

## 2020-02-10 LAB — CUP PACEART REMOTE DEVICE CHECK
Battery Remaining Longevity: 84 mo
Battery Remaining Percentage: 81 %
Brady Statistic RA Percent Paced: 0 %
Brady Statistic RV Percent Paced: 44 %
Date Time Interrogation Session: 20211210021000
Implantable Lead Implant Date: 20170417
Implantable Lead Implant Date: 20170417
Implantable Lead Location: 753859
Implantable Lead Location: 753860
Implantable Lead Model: 7740
Implantable Lead Model: 7741
Implantable Lead Serial Number: 662696
Implantable Lead Serial Number: 751382
Implantable Pulse Generator Implant Date: 20170417
Lead Channel Impedance Value: 666 Ohm
Lead Channel Impedance Value: 736 Ohm
Lead Channel Setting Pacing Amplitude: 2.5 V
Lead Channel Setting Pacing Amplitude: 2.5 V
Lead Channel Setting Pacing Pulse Width: 0.4 ms
Lead Channel Setting Sensing Sensitivity: 2.5 mV
Pulse Gen Serial Number: 718418

## 2020-02-12 ENCOUNTER — Other Ambulatory Visit (HOSPITAL_COMMUNITY): Payer: Self-pay | Admitting: General Practice

## 2020-02-12 MED FILL — AMOXICILLIN 500 MG CAPSULE: 500 | 5 days supply | Qty: 20 | Fill #0

## 2020-02-15 MED FILL — EZETIMIBE 10 MG TABS: 10 | 90 days supply | Qty: 90 | Fill #1

## 2020-02-15 MED FILL — ELIQUIS 5 MG TABLET: 5 | 30 days supply | Qty: 60 | Fill #1

## 2020-02-15 MED FILL — PANTOPRAZOLE SOD DR 40 MG T: 40 | 90 days supply | Qty: 90 | Fill #1

## 2020-02-21 NOTE — Progress Notes (Signed)
Remote pacemaker transmission.   

## 2020-02-27 ENCOUNTER — Encounter: Payer: Self-pay | Admitting: Internal Medicine

## 2020-02-27 ENCOUNTER — Other Ambulatory Visit: Payer: Self-pay

## 2020-02-27 ENCOUNTER — Non-Acute Institutional Stay: Payer: Medicare Other | Admitting: Internal Medicine

## 2020-02-27 VITALS — BP 142/92 | HR 68 | Temp 97.5°F | Ht 74.0 in | Wt 225.0 lb

## 2020-02-27 DIAGNOSIS — I209 Angina pectoris, unspecified: Secondary | ICD-10-CM | POA: Diagnosis not present

## 2020-02-27 DIAGNOSIS — M47812 Spondylosis without myelopathy or radiculopathy, cervical region: Secondary | ICD-10-CM

## 2020-02-27 DIAGNOSIS — I5022 Chronic systolic (congestive) heart failure: Secondary | ICD-10-CM | POA: Diagnosis not present

## 2020-02-27 DIAGNOSIS — I739 Peripheral vascular disease, unspecified: Secondary | ICD-10-CM

## 2020-02-27 DIAGNOSIS — I208 Other forms of angina pectoris: Secondary | ICD-10-CM | POA: Diagnosis not present

## 2020-02-27 DIAGNOSIS — I2510 Atherosclerotic heart disease of native coronary artery without angina pectoris: Secondary | ICD-10-CM

## 2020-02-27 DIAGNOSIS — D805 Immunodeficiency with increased immunoglobulin M [IgM]: Secondary | ICD-10-CM | POA: Insufficient documentation

## 2020-02-27 DIAGNOSIS — I4819 Other persistent atrial fibrillation: Secondary | ICD-10-CM

## 2020-02-27 DIAGNOSIS — I2089 Other forms of angina pectoris: Secondary | ICD-10-CM

## 2020-02-27 NOTE — Progress Notes (Signed)
Location:  Medical illustrator of Service:  Clinic (12)  Provider: Dub Maclellan L. Renato Gails, D.O., C.M.D.  Code Status: FULL  Goals of Care:  Advanced Directives 02/27/2020  Does Patient Have a Medical Advance Directive? Yes  Type of Advance Directive Living will;Healthcare Power of [place];Out of facility DNR (pink MOST or yellow form)  Does patient want to make changes to medical advance directive? No - Patient declined  Copy of Healthcare Power of Attorney in Chart? Yes - validated most recent copy scanned in chart (See row information)  Would patient like information on creating a medical advance directive? -  Pre-existing out of facility DNR order (yellow form or pink MOST form) Pink MOST/Yellow Form most recent copy in chart - Physician notified to receive inpatient order  Has living will and HCPOA but none were scanned into vynca after first appt   Chief Complaint  Patient presents with  . Medical Management of Chronic Issues    3 month follow up     HPI: Patient is a 85 y.o. male seen today for medical management of chronic diseases.    1/23, has f/u with Dr. Excell Seltzer.  Reviewed echo with him.  He's off plavix and now on baby asa.  Also on eliquis.    He's going on a diet in a week like everyone does in the new year.  He is taking 3-4mg  of prednisone for adrenal insufficiency.  Dr. Elvera Lennox had recommended 10mg .  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  He is taking less codeine--usually 5mg  twice a day.  His arthritis is the worst--cannot move either shoulder--saw Dr. who wanted to operate but then saw Dr. who would not "touch him with a 10 foot pole."   [address] and [address] Aluisio passed on his hips and knees, too.  He walks the circle in front of his house.  He gets a funny sensation in his chest and more PVCs and PACs than he used to.  Sometimes nothing.  He cannot tell what precipitates it.    He does not take the  lorazepam except at night.  He doesn't think an extra one will help and will just make him sleepy in the day.  Has no numbness or weakness of his arms, but can't hold his neck up to shave.  Dr. Whiteberg looked at him and took imaging which was a "mess".     Past Medical History:  Diagnosis Date  . Aortic stenosis    moderate aortic stenosis  . Arthritis   . Benign prostatic hypertrophy   . Chronic systolic heart failure (HCC)   . 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  . Coronary artery disease    status post multiple prior percutaneous coronary interventions, microvascular angina per Dr [name]  . Diverticulitis sigmoid colon recurrent   . Dysfunctional autonomic nervous system   . Dyspnea   . Heart murmur   . History of primary hypertension   . Hypercoagulable state (HCC)    chronically anticoagulated with coumadin  . Hyperlipidemia   . Hyperthyroidism   . Hypothyroidism    Dr. Corliss Skains  . MGUS (monoclonal gammopathy of unknown significance) 02/17/2013  . Ocular myasthenia (HCC)   . Osteoarthritis   . Paroxysmal atrial fibrillation (HCC)    DR. [name],   .  Prediabetes 09/21/2017  . Stroke [facility])    1990    Past Surgical History:  Procedure Laterality Date  . AORTIC VALVE REPLACEMENT  03/15/2011   Procedure: AORTIC VALVE REPLACEMENT (AVR);  Surgeon: Gaye Pollack, MD;  Location: [place];  Service: Open Heart Surgery;  Laterality: N/A;  . APPENDECTOMY    . CARDIAC CATHETERIZATION     11  . CARDIOVERSION    . CARDIOVERSION  04/15/2011   Procedure: CARDIOVERSION;  Surgeon: Loralie Champagne, MD;  Location: [place];  Service: Cardiovascular;  Laterality: N/A;  . CARDIOVERSION N/A 09/11/2014   Procedure: CARDIOVERSION;  Surgeon: Sueanne Margarita, MD;  Location: [facility] ENDOSCOPY;  Service: Cardiovascular;  Laterality: N/A;  .  CARDIOVERSION N/A 06/27/2015   Procedure: CARDIOVERSION;  Surgeon: Thayer Headings, MD;  Location: [facility] ENDOSCOPY;  Service: Cardiovascular;  Laterality: N/A;  . CARDIOVERSION N/A 07/04/2015   Procedure: CARDIOVERSION;  Surgeon: Evans Lance, MD;  Location: [place];  Service: Cardiovascular;  Laterality: N/A;  . CARDIOVERSION N/A 04/13/2017   Procedure: CARDIOVERSION;  Surgeon: Sueanne Margarita, MD;  Location: [facility] ENDOSCOPY;  Service: Cardiovascular;  Laterality: N/A;  . COLONOSCOPY    . CORONARY STENT INTERVENTION N/A 10/15/2019   Procedure: CORONARY STENT INTERVENTION;  Surgeon: Jettie Booze, MD;  Location: [place] CV LAB;  Service: Cardiovascular;  Laterality: N/A;  . EP IMPLANTABLE DEVICE N/A 06/16/2015   Procedure: Pacemaker Implant;  Surgeon: Evans Lance, MD;  Location: [place] CV LAB;  Service: Cardiovascular;  Laterality: N/A;  . hemrrhoidectomy    . LEFT AND RIGHT HEART CATHETERIZATION WITH CORONARY ANGIOGRAM Bilateral 02/01/2011   Procedure: LEFT AND RIGHT HEART CATHETERIZATION WITH CORONARY ANGIOGRAM;  Surgeon: Hillary Bow, MD;  Location: [facility] CATH LAB;  Service: Cardiovascular;  Laterality: Bilateral;  . LEFT HEART CATH AND CORONARY ANGIOGRAPHY N/A 10/15/2019   Procedure: LEFT HEART CATH AND CORONARY ANGIOGRAPHY;  Surgeon: Jettie Booze, MD;  Location: [place] CV LAB;  Service: Cardiovascular;  Laterality: N/A;  . MAZE  03/15/2011   Procedure: MAZE;  Surgeon: Gaye Pollack, MD;  Location: [place];  Service: Open Heart Surgery;  Laterality: N/A;  . PACEMAKER INSERTION  1991   Guidant PPM, most recent Generator Change by Dr Olevia Perches was 08/22/06  . RIGHT/LEFT HEART CATH AND CORONARY ANGIOGRAPHY N/A 07/06/2016   Procedure: Right/Left Heart Cath and Coronary Angiography;  Surgeon: Sherren Mocha, MD;  Location: [place] CV LAB;  Service: Cardiovascular;  Laterality: N/A;  . TEE WITHOUT CARDIOVERSION  04/15/2011   Procedure: TRANSESOPHAGEAL ECHOCARDIOGRAM (TEE);   Surgeon: Loralie Champagne, MD;  Location: [place];  Service: Cardiovascular;  Laterality: N/A;  . TEE WITHOUT CARDIOVERSION N/A 09/11/2014   Procedure: TRANSESOPHAGEAL ECHOCARDIOGRAM (TEE);  Surgeon: Sueanne Margarita, MD;  Location: [facility] ENDOSCOPY;  Service: Cardiovascular;  Laterality: N/A;    Allergies  Allergen Reactions  . Contrast Media [Iodinated Diagnostic Agents] Hives  . Gadolinium Derivatives Hives  . Metrizamide Hives    Outpatient Encounter Medications as of 02/27/2020  Medication Sig  . amLODipine (NORVASC) 5 MG tablet Take 1 tablet (5 mg total) by mouth at bedtime as needed.  Marland Kitchen apixaban (ELIQUIS) 5 MG TABS tablet Take 1 tablet (5 mg total) by mouth 2 (two) times daily.  Marland Kitchen aspirin 81 MG chewable tablet Chew by mouth daily.  . bisacodyl (DULCOLAX) 5 MG EC tablet Take 5 mg by mouth as needed (constipation).   . butalbital-acetaminophen-caffeine (FIORICET, ESGIC) 50-325-40 MG per tablet Take 1 tablet by  mouth 2 (two) times daily as needed for headache (osteo-arthritis pain).   . Cholecalciferol (VITAMIN D3) 2000 units capsule Take 1,000 Units by mouth daily.   . Coenzyme Q10 100 MG capsule Take 100 mg by mouth daily.   . diclofenac Sodium (VOLTAREN) 1 % GEL Apply 2 g topically 2 (two) times daily as needed (knee pain).  Marland Kitchen diltiazem (CARDIZEM) 30 MG tablet Take 1 tablet (30 mg total) by mouth every 6 (six) hours as needed (for HRs greater than 150).  Marland Kitchen doxazosin (CARDURA) 4 MG tablet Take 4 mg by mouth at bedtime.   Marland Kitchen ezetimibe (ZETIA) 10 MG tablet Take 1 tablet (10 mg total) by mouth daily.  Marland Kitchen HYDROcodone-acetaminophen (NORCO/VICODIN) 5-325 MG tablet TAKE 1 TABLET BY MOUTH 3 TIMES DAILY AS NEEDED FOR MODERATE PAIN  . hydrocortisone sodium succinate (SOLU-CORTEF) 100 MG SOLR injection Inject 100 mL in the muscle as needed when you cannot take Prednisone by mouth  . LORazepam (ATIVAN) 1 MG tablet TAKE 1 TABLET BY MOUTH AT BEDTIME. MAY ALSO TAKE 1/2 OF A TABLET DAILY AS NEEDED FOR  ANXIETY.  . magnesium oxide (MAG-OX) 400 MG tablet Take 400 mg by mouth daily.  . metoprolol succinate (TOPROL-XL) 100 MG 24 hr tablet Take 1 tablet (100 mg total) by mouth 2 (two) times daily. Take with or immediately following a meal.  . nitroGLYCERIN (NITROSTAT) 0.4 MG SL tablet Place 1 tablet (0.4 mg total) under the tongue every 5 (five) minutes as needed for chest pain.  Marland Kitchen OVER THE COUNTER MEDICATION Apply 1 patch topically daily as needed (neck and shoulder pain). Chinese pain patch  . pantoprazole (PROTONIX) 40 MG tablet Take 1 tablet (40 mg total) by mouth daily.  . polyethylene glycol (MIRALAX / GLYCOLAX) 17 g packet Take 17 g by mouth daily as needed for mild constipation.   . potassium chloride SA (KLOR-CON) 20 MEQ tablet Take 20 mEq by mouth daily.  . predniSONE (DELTASONE) 5 MG tablet Take 5 mg by mouth daily with breakfast.  . rosuvastatin (CRESTOR) 20 MG tablet Take 1 tablet (20 mg total) by mouth at bedtime.   No facility-administered encounter medications on file as of 02/27/2020.    Review of Systems:  Review of Systems  Constitutional: Positive for malaise/fatigue. Negative for chills and fever.  HENT: Negative for congestion and sore throat.   Eyes: Negative for blurred vision.  Respiratory: Negative for cough and shortness of breath.   Cardiovascular: Positive for chest pain and palpitations. Negative for orthopnea and leg swelling.  Gastrointestinal: Negative for abdominal pain, blood in stool, constipation, diarrhea and melena.  Genitourinary: Negative for dysuria.  Musculoskeletal: Positive for joint pain and neck pain. Negative for falls and myalgias.  Skin: Negative for itching and rash.  Neurological: Positive for dizziness. Negative for loss of consciousness and weakness.  Psychiatric/Behavioral: Negative for depression and memory loss. The patient is nervous/anxious.     Health Maintenance  Topic Date Due  . TETANUS/TDAP  Never done  . PNA vac Low Risk  Adult (1 of 2 - PCV13) Never done  . COVID-19 Vaccine (4 - Booster for Pfizer series) 04/30/2020  . INFLUENZA VACCINE  Completed    Physical Exam: Vitals:   02/27/20 1359  BP: (!) 142/92  Pulse: 68  Temp: (!) 97.5 F (36.4 C)  SpO2: 97%  Weight: 225 lb (102.1 kg)  Height: 6\' 2"  (1.88 m)   Body mass index is 28.89 kg/m. Physical Exam Vitals reviewed.  Constitutional:  Appearance: Normal appearance.  HENT:     Head: Normocephalic and atraumatic.  Cardiovascular:     Rate and Rhythm: Normal rate and regular rhythm.     Heart sounds: Murmur heard.      Comments: Dorsalis pedis pulses intact Pulmonary:     Effort: Pulmonary effort is normal.     Breath sounds: Normal breath sounds. No wheezing, rhonchi or rales.  Musculoskeletal:        General: Normal range of motion.     Right lower leg: No edema.     Left lower leg: No edema.     Comments: Kyphosis; heberden's and bouchard's nodes of digits   Neurological:     General: No focal deficit present.     Mental Status: He is alert and oriented to person, place, and time.  Psychiatric:        Mood and Affect: Mood normal.     Comments: Pleasant and sociable     Labs reviewed: Basic Metabolic Panel: Recent Labs    10/13/19 2012 10/14/19 0208 10/15/19 0624 10/16/19 0355 11/06/19 0238 11/26/19 1616 01/11/20 0947  NA  --  136 139   < > 136 143 141  K  --  3.6 3.9   < > 3.6 4.7 4.2  CL  --  104 107   < > 99 105 106  CO2  --  24 24   < > 26 26 29   GLUCOSE  --  119* 161*   < > 169* 90 114*  BUN  --  11 6*   < > 13 12 11   CREATININE  --  0.84 0.79   < > 0.99 0.88 1.00  CALCIUM  --  8.5* 8.9   < > 8.7* 9.2 9.3  MG 2.3 2.3 2.2  --   --   --   --   TSH  --  2.231  --   --   --   --   --    < > = values in this interval not displayed.   Liver Function Tests: Recent Labs    10/13/19 1158 10/14/19 0208 01/11/20 0947  AST 32 30 26  ALT 23 19 20   ALKPHOS 82 81 101  BILITOT 0.8 0.8 0.6  PROT 7.0 6.1* 7.3   ALBUMIN 3.9 3.3* 3.9   Recent Labs    10/13/19 1158  LIPASE 21   No results for input(s): AMMONIA in the last 8760 hours. CBC: Recent Labs    10/29/19 1003 11/06/19 0238 11/26/19 1616 01/11/20 0947  WBC 7.9 7.7 6.9 5.7  NEUTROABS 6.3  --  5.6 4.4  HGB 11.4* 10.8* 10.9* 10.6*  HCT 34.6* 34.3* 33.9* 32.5*  MCV 87 89.8 89 88.8  PLT 258 180 218 187   Lipid Panel: Recent Labs    10/16/19 0355  CHOL 134  HDL 78  LDLCALC 45  TRIG 55  CHOLHDL 1.7   Lab Results  Component Value Date   HGBA1C 6.2 (H) 10/16/2019    Procedures since last visit: CUP [place]  Result Date: 02/10/2020 Scheduled remote reviewed. Normal device function.  Known persistent atrial arrhythmia VHR episodes with morphology similar to underlying Next remote 91 days.   Assessment/Plan 1. Chronic HFrEF (heart failure with reduced ejection fraction) (HCC) -noted on echo as above -no signs of volume overload -doing well at present, cont same medications  2. Coronary artery disease involving native coronary artery of native heart without angina pectoris -has occasional  sporadic pains in the chest but not consistent and he tries not to dwell on them -cont current regimen and cardiology close monitoring  3. Syndrome X (cardiac) (HCC) -original issue for him and his twin with microvascular disease  4. 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 Benay Spice  5. Claudication in peripheral vascular disease ([place]) -not mentioned today, does short walks around his cul-de[place]sac  6. Spondylosis of cervical region without myelopathy or radiculopathy -one of his biggest issues along with generalized OA -continues on pain regimen from rheumatology for this and also on opioids from previous PCP that we are now covering for him -he's unable to  take nsaids with his other asa and eliquis -cont voltaren gel -also on low dose steroids from rheum as well and does appear more comfortable than last time  7. Persistent atrial fibrillation ([place]) -continues on eliquis noac -has pacemaker in place, toprol rate control  Labs/tests ordered:  No new; reviewed panel from heme/onc  Next appt:  06/25/2020  Roxanne Panek L. Karlisa Gaubert, D.O. [facility] [address], [place] [address] Cell Phone (Mon-Fri 8am-5pm):  [phone] On Call:  [phone] & follow prompts after 5pm & weekends Office Phone:  [phone] Office Fax:  [phone]

## 2020-03-03 ENCOUNTER — Other Ambulatory Visit: Payer: Self-pay | Admitting: Rheumatology

## 2020-03-03 ENCOUNTER — Ambulatory Visit: Payer: Medicare Other | Admitting: Cardiovascular Disease

## 2020-03-03 MED FILL — LORazepam 1 MG TABS: 1 | 30 days supply | Qty: 45 | Fill #1

## 2020-03-03 MED FILL — HYDROCODON-APAP 5-325: 5-325 | 30 days supply | Qty: 90 | Fill #0

## 2020-03-03 NOTE — Telephone Encounter (Signed)
If he continues to take lorazepam at bedtime then I would recommend decreasing hydrocodone 5/325 mg a.m. and noon only.  Please discuss this with the patient.  If he is in agreement we will change the prescription accordingly for the future refills.

## 2020-03-03 NOTE — Telephone Encounter (Signed)
Last Visit: 12/04/2019 Next Visit: 06/04/2020 UDS: 12/04/2019 Narc Agreement: 12/04/2019  Last Fill: 01/25/2020  Okay to refill Hydrocodone?

## 2020-03-03 NOTE — Telephone Encounter (Signed)
Please advise patient that he should not take lorazepam and hydrocodone together due to increased risk of respiratory suppression.

## 2020-03-03 NOTE — Telephone Encounter (Signed)
Patient advised that he should not take lorazepam and hydrocodone together due to increased risk of respiratory suppression. Patient advised if he continues to take lorazepam at bedtime then Dr. Corliss Skains  would recommend decreasing hydrocodone 5/325 mg a.m. and noon only. Dr. Discussed this with the patient. Patient states he has previously been on Hydrocodone 10 mg twice daily. Patient states if he decreases the Hydrocodone what can he do of the pain. He is unable take NSAIDS. Patient is concerned about decreasing the dose. Please advise.

## 2020-03-04 DIAGNOSIS — Z20828 Contact with and (suspected) exposure to other viral communicable diseases: Secondary | ICD-10-CM | POA: Diagnosis not present

## 2020-03-04 DIAGNOSIS — Z9189 Other specified personal risk factors, not elsewhere classified: Secondary | ICD-10-CM | POA: Diagnosis not present

## 2020-03-04 NOTE — Telephone Encounter (Signed)
Patient contacted the office and stated he would rather discuss with Dr. Corliss Skains in person and has scheduled an appointment with her on 03/19/2020.

## 2020-03-06 NOTE — Progress Notes (Signed)
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  He continues to have pain and discomfort in his bilateral shoulders and have difficulty raising his arms.  He has been having increased pain in his neck and having difficulty rotating his head.  He continues to have pain and discomfort in his bilateral hands and bilateral knee joints.  Left trochanteric bursitis still painful and he has difficulty walking.  He has been taking hydrocodone 5 mg 3 times a day to relieve pain.  He states he has been on lorazepam for many years and  never had any side effects.  He states without hydrocodone his pain level is 8-9 on the scale of 0-10 and with hydrocodone about 4.  Activities of Daily Living:  Patient reports morning stiffness for 24 hours.   Patient Denies nocturnal pain.  Difficulty dressing/grooming: Reports Difficulty climbing stairs: Denies Difficulty getting out of chair: Denies Difficulty using hands for taps, buttons, cutlery, and/or writing: Reports  Review of Systems  Constitutional: Positive for fatigue. Negative for night sweats.  HENT: Positive for mouth dryness. Negative for mouth sores and nose dryness.   Eyes: Positive for dryness. Negative for redness.  Respiratory: Negative for shortness of breath and difficulty breathing.   Cardiovascular: Negative for chest pain, palpitations, hypertension, irregular heartbeat and swelling in legs/feet.  Gastrointestinal: Negative for constipation and diarrhea.  Endocrine: Negative for increased urination.  Genitourinary: Negative for difficulty urinating.  Musculoskeletal: Positive for arthralgias, gait problem,  joint pain and morning stiffness. Negative for joint swelling, myalgias, muscle weakness, muscle tenderness and myalgias.  Skin: Negative for color change, rash, hair loss, nodules/bumps, skin tightness, ulcers and sensitivity to sunlight.  Allergic/Immunologic: Negative for susceptible to infections.  Neurological: Positive for weakness. Negative for dizziness, fainting, memory loss and night sweats.  Hematological: Negative for bruising/bleeding tendency and swollen glands.  Psychiatric/Behavioral: Negative for depressed mood and sleep disturbance. The patient is not nervous/anxious.     PMFS History:  Patient Active Problem List   Diagnosis Date Noted  . 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[place]) 02/27/2020  . Chronic HFrEF (heart failure with reduced ejection fraction) ([place]) 02/27/2020  . 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 Urinary retention with incomplete bladder emptying 12/05/2019  . Physical deconditioning 12/05/2019  . Other hemorrhoids 12/05/2019  . Adjustment reaction with anxiety 12/05/2019  . A-fib ([place]) 10/14/2019  . Atrial fibrillation with rapid ventricular response ([place]) 10/13/2019  . NSTEMI (non-ST elevated myocardial infarction) ([place]) 10/13/2019  . Acute diarrhea 10/13/2019  . Normocytic anemia 10/13/2019  . Diverticulitis sigmoid colon recurrent   . Prediabetes 09/21/2017  . Persistent atrial fibrillation ([place]) 04/11/2017  . Vitamin D deficiency 05/20/2016  . History of pacemaker 05/20/2016  . History of stroke/[place]  05/20/2016  . Primary osteoarthritis of both hands 03/10/2016  . DDD cervical spine 03/10/2016  . 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 Chronic left shoulder pain 03/10/2016  . Trochanteric bursitis of right hip 03/10/2016  . Other fatigue 03/10/2016  . Claudication in  peripheral vascular disease ([place]) 03/10/2016  . Chronic pain syndrome 03/10/2016  . Ocular myasthenia gravis ([place]) 10/28/2015  . Typical  atrial flutter ([place])   . PVC's (premature ventricular contractions) 01/30/2015  . Pacemaker 04/30/2013  . IgM monoclonal gammopathy of uncertain significance 02/17/2013  . Orthostatic hypotension 04/11/2011  . Long term current use of anticoagulant therapy 03/24/2011  . S/P AVR 03/24/2011  . Pleural effusion 03/24/2011  . Hypothyroidism 11/22/2010  . Atrial flutter ([place]) 08/19/2010  . Syndrome X (cardiac) ([place]) 04/06/2010  . Mixed hyperlipidemia 12/08/2007  . PRIMARY HYPERCOAGULABLE STATE 12/08/2007  . Coronary artery disease with exertional angina ([place]) 12/08/2007  . Coronary artery disease involving native coronary artery of native heart 12/08/2007  . AORTIC STENOSIS/ INSUFFICIENCY, NON-RHEUMATIC 12/08/2007  . Atrial fibrillation ([place]) 12/08/2007  . Chronotropic incompetence with sinus node dysfunction (HCC) 12/08/2007    Past Medical History:  Diagnosis Date  . Aortic stenosis    moderate aortic stenosis  . Arthritis   . Benign prostatic hypertrophy   . Chronic systolic heart failure ([place])   . 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  . Coronary artery disease    status post multiple prior percutaneous coronary interventions, microvascular angina per Dr Olevia Perches  . Diverticulitis sigmoid colon recurrent   . Dysfunctional autonomic nervous system   . Dyspnea   . Heart murmur   . History of primary hypertension   . Hypercoagulable state ([place])    chronically anticoagulated with coumadin  . Hyperlipidemia   . Hyperthyroidism   . Hypothyroidism    Dr. Elyse Hsu  . MGUS (monoclonal gammopathy of unknown significance) 02/17/2013  . Ocular myasthenia ([place])   . Osteoarthritis   . Paroxysmal atrial fibrillation ([place])    DR. Lia Foyer,   . Prediabetes 09/21/2017  . Stroke [facility])    1990    Family History  Problem Relation Age  of Onset  . Heart disease Brother        Twin brother has coronary disease and recent AVR for AS  . CAD Brother   . Atrial fibrillation Brother   . Sarcoidosis Brother   . Depression Daughter   . Anorexia nervosa Daughter   . Hypertension Son   . Anesthesia problems Neg Hx   . Hypotension Neg Hx   . Malignant hyperthermia Neg Hx   . Pseudochol deficiency Neg Hx    Past Surgical History:  Procedure Laterality Date  . AORTIC VALVE REPLACEMENT  03/15/2011   Procedure: AORTIC VALVE REPLACEMENT (AVR);  Surgeon: Gaye Pollack, MD;  Location: [place];  Service: Open Heart Surgery;  Laterality: N/A;  . APPENDECTOMY    . CARDIAC CATHETERIZATION     11  . CARDIOVERSION    . CARDIOVERSION  04/15/2011   Procedure: CARDIOVERSION;  Surgeon: Loralie Champagne, MD;  Location: [place];  Service: Cardiovascular;  Laterality: N/A;  . CARDIOVERSION N/A 09/11/2014   Procedure: CARDIOVERSION;  Surgeon: Sueanne Margarita, MD;  Location: [facility] ENDOSCOPY;  Service: Cardiovascular;  Laterality: N/A;  . CARDIOVERSION N/A 06/27/2015   Procedure: CARDIOVERSION;  Surgeon: Thayer Headings, MD;  Location: [facility] ENDOSCOPY;  Service: Cardiovascular;  Laterality: N/A;  . CARDIOVERSION N/A 07/04/2015   Procedure: CARDIOVERSION;  Surgeon: Evans Lance, MD;  Location: [place];  Service: Cardiovascular;  Laterality: N/A;  . CARDIOVERSION N/A 04/13/2017   Procedure: CARDIOVERSION;  Surgeon: Sueanne Margarita,  MD;  Location: [facility] ENDOSCOPY;  Service: Cardiovascular;  Laterality: N/A;  . COLONOSCOPY    . CORONARY STENT INTERVENTION N/A 10/15/2019   Procedure: CORONARY STENT INTERVENTION;  Surgeon: Jettie Booze, MD;  Location: [place] CV LAB;  Service: Cardiovascular;  Laterality: N/A;  . EP IMPLANTABLE DEVICE N/A 06/16/2015   Procedure: Pacemaker Implant;  Surgeon: Evans Lance, MD;  Location: [place] CV LAB;  Service: Cardiovascular;  Laterality: N/A;  . hemrrhoidectomy    . LEFT AND RIGHT HEART CATHETERIZATION WITH  CORONARY ANGIOGRAM Bilateral 02/01/2011   Procedure: LEFT AND RIGHT HEART CATHETERIZATION WITH CORONARY ANGIOGRAM;  Surgeon: Hillary Bow, MD;  Location[facility] CATH LAB;  Service: Cardiovascular;  Laterality: Bilateral;  . LEFT HEART CATH AND CORONARY ANGIOGRAPHY N/A 10/15/2019   Procedure: LEFT HEART CATH AND CORONARY ANGIOGRAPHY;  Surgeon: Jettie Booze, MD;  Location: [place] CV LAB;  Service: Cardiovascular;  Laterality: N/A;  . MAZE  03/15/2011   Procedure: MAZE;  Surgeon: Gaye Pollack, MD;  Location: [place];  Service: Open Heart Surgery;  Laterality: N/A;  . PACEMAKER INSERTION  1991   Guidant PPM, most recent Generator Change by Dr Olevia Perches was 08/22/06  . RIGHT/LEFT HEART CATH AND CORONARY ANGIOGRAPHY N/A 07/06/2016   Procedure: Right/Left Heart Cath and Coronary Angiography;  Surgeon: Sherren Mocha, MD;  Location: [place] CV LAB;  Service: Cardiovascular;  Laterality: N/A;  . TEE WITHOUT CARDIOVERSION  04/15/2011   Procedure: TRANSESOPHAGEAL ECHOCARDIOGRAM (TEE);  Surgeon: Loralie Champagne, MD;  Location: [place];  Service: Cardiovascular;  Laterality: N/A;  . TEE WITHOUT CARDIOVERSION N/A 09/11/2014   Procedure: TRANSESOPHAGEAL ECHOCARDIOGRAM (TEE);  Surgeon: Sueanne Margarita, MD;  Location: [facility] ENDOSCOPY;  Service: Cardiovascular;  Laterality: N/A;   Social History   Social History Narrative   Married to [place]. Has grown children   Retired Horticulturist, commercial MD      Never smoker no alcohol      Immunization History  Administered Date(s) Administered  . Influenza Split 12/30/2017  . Influenza, High Dose Seasonal PF 12/28/2019  . PFIZER(Purple Top)SARS-COV-2 Vaccination 03/13/2019, 04/02/2019, 11/01/2019     Objective: Vital Signs: BP 103/67 (BP Location: Left Arm, Patient Position: Sitting, Cuff Size: Normal)   Pulse 86   Resp 17   Ht 6\' 1"  (1.854 m)   Wt 224 lb 8 oz (101.8 kg)   BMI 29.62 kg/m    Physical Exam Vitals and nursing note reviewed.  Constitutional:       Appearance: He is well-developed and well-nourished.  HENT:     Head: Normocephalic and atraumatic.  Eyes:     Extraocular Movements: EOM normal.     Conjunctiva/sclera: Conjunctivae normal.     Pupils: Pupils are equal, round, and reactive to light.  Cardiovascular:     Rate and Rhythm: Normal rate and regular rhythm.     Heart sounds: Normal heart sounds.  Pulmonary:     Effort: Pulmonary effort is normal.     Breath sounds: Normal breath sounds.  Abdominal:     General: Bowel sounds are normal.     Palpations: Abdomen is soft.  Musculoskeletal:     Cervical back: Normal range of motion and neck supple.  Skin:    General: Skin is warm and dry.     Capillary Refill: Capillary refill takes less than 2 seconds.  Neurological:     Mental Status: He is alert and oriented to person, place, and time.  Psychiatric:  Mood and Affect: Mood and affect normal.        Behavior: Behavior normal.      Musculoskeletal Exam: He has limited range of motion of his cervical spine.  He has thoracic kyphosis.  He had very limited range of motion of bilateral shoulder joints.  Elbow joints with good range of motion.  He has bilateral PIP and DIP thickening with no synovitis.  He has good range of motion of his hip joints and knee joints.  No tenderness over ankles or MTPs was noted.  CDAI Exam: CDAI Score: -- Patient Global: --; Provider Global: -- Swollen: --; Tender: -- Joint Exam 03/19/2020   No joint exam has been documented for this visit   There is currently no information documented on the homunculus. Go to the Rheumatology activity and complete the homunculus joint exam.  Investigation: No additional findings.  Imaging: No results found.  Recent Labs: Lab Results  Component Value Date   WBC 6.7 03/19/2020   HGB 11.2 (L) 03/19/2020   PLT 203 03/19/2020   NA 141 01/11/2020   K 4.2 01/11/2020   CL 106 01/11/2020   CO2 29 01/11/2020   GLUCOSE 114 (H) 01/11/2020   BUN 11  01/11/2020   CREATININE 1.00 01/11/2020   BILITOT 0.6 01/11/2020   ALKPHOS 101 01/11/2020   AST 26 01/11/2020   ALT 20 01/11/2020   PROT 7.3 01/11/2020   ALBUMIN 3.9 01/11/2020   CALCIUM 9.3 01/11/2020   GFRAA 92 11/26/2019    Speciality Comments: No specialty comments available.  Procedures:  No procedures performed Allergies: Contrast media [iodinated diagnostic agents], Gadolinium derivatives, and Metrizamide   Assessment / Plan:     Visit Diagnoses: Primary osteoarthritis of both shoulders-he has severe osteoarthritis in his bilateral shoulder joints.  He has seen surgeons in the past and would not be able to have surgery.  He has limited range of motion.  He would like to have physical therapy.  Prescription for physical therapy was given.  Primary osteoarthritis of both hands-he has severe osteoarthritis in his bilateral hands.  Joint protection muscle strengthening was discussed.  Trochanteric bursitis, right hip-he has severe right trochanteric bursitis and has difficulty walking due to pain.  I have given him a prescription for physical therapy.  DDD (degenerative disc disease), cervical-he has limited range of motion of his cervical spine and chronic pain.  He will benefit from physical therapy.  DDD (degenerative disc disease), lumbar-he continues to have some lower back pain.  Other chronic pain - hydrocodone to 5 -325 mg 3 times daily as needed.  He has to take hydrocodone on a regular basis to relieve pain.  He states he will try to taper hydrocortisone on as tolerated.  So far he is unable to function on a lower dose.  He is unable to take any NSAIDs due to chronic anticoagulation.  UDS & narcotic agreement: Were obtained today.  Medication monitoring encounter - Plan: DRUG MONITOR, TRAMADOL,QN, URINE, DRUG MONITOR, PANEL 5, W/CONF, URINE  Myofascial pain-he continues to have some generalized pain and discomfort.  Other fatigue-he has chronic fatigue.  Other  medical problems are listed as follows:  History of pacemaker  S/P AVR  Paroxysmal atrial fibrillation (HCC)  Primary hypercoagulable state (HCC)  History of stroke  Coronary artery disease with exertional angina [facility]) - he had MI in August and had a stent placement.    History of diverticulitis - August 2021 treated with Cipro   MGUS (monoclonal gammopathy  of unknown significance) - followed by hematology.  Other depression  Adrenal insufficiency-he has been seeing endocrinologist and is on low-dose hydrocortisone.  He is fully immunized against COVID-19 and also received a booster.  Use of mask, social distancing and hand hygiene was discussed.  Orders: Orders Placed This Encounter  Procedures  . DRUG MONITOR, TRAMADOL,QN, URINE  . DRUG MONITOR, PANEL 5, W/CONF, URINE   No orders of the defined types were placed in this encounter.   Follow-Up Instructions: Return in about 6 months (around 09/16/2020) for Osteoarthritis.   Bo Merino, MD  Note - This record has been created using Editor, commissioning.  Chart creation errors have been sought, but may not always  have been located. Such creation errors do not reflect on  the standard of medical care.

## 2020-03-19 ENCOUNTER — Ambulatory Visit (INDEPENDENT_AMBULATORY_CARE_PROVIDER_SITE_OTHER): Payer: Medicare Other | Admitting: Rheumatology

## 2020-03-19 ENCOUNTER — Other Ambulatory Visit: Payer: Self-pay

## 2020-03-19 ENCOUNTER — Inpatient Hospital Stay: Payer: Medicare Other | Attending: Oncology

## 2020-03-19 ENCOUNTER — Encounter: Payer: Self-pay | Admitting: Rheumatology

## 2020-03-19 VITALS — BP 103/67 | HR 86 | Resp 17 | Ht 73.0 in | Wt 224.5 lb

## 2020-03-19 DIAGNOSIS — I480 Paroxysmal atrial fibrillation: Secondary | ICD-10-CM | POA: Diagnosis not present

## 2020-03-19 DIAGNOSIS — M19041 Primary osteoarthritis, right hand: Secondary | ICD-10-CM

## 2020-03-19 DIAGNOSIS — M19011 Primary osteoarthritis, right shoulder: Secondary | ICD-10-CM | POA: Diagnosis not present

## 2020-03-19 DIAGNOSIS — E2740 Unspecified adrenocortical insufficiency: Secondary | ICD-10-CM

## 2020-03-19 DIAGNOSIS — Z950 Presence of cardiac pacemaker: Secondary | ICD-10-CM | POA: Diagnosis not present

## 2020-03-19 DIAGNOSIS — M19012 Primary osteoarthritis, left shoulder: Secondary | ICD-10-CM

## 2020-03-19 DIAGNOSIS — I25118 Atherosclerotic heart disease of native coronary artery with other forms of angina pectoris: Secondary | ICD-10-CM

## 2020-03-19 DIAGNOSIS — R5383 Other fatigue: Secondary | ICD-10-CM | POA: Diagnosis not present

## 2020-03-19 DIAGNOSIS — F3289 Other specified depressive episodes: Secondary | ICD-10-CM

## 2020-03-19 DIAGNOSIS — Z8719 Personal history of other diseases of the digestive system: Secondary | ICD-10-CM

## 2020-03-19 DIAGNOSIS — M7918 Myalgia, other site: Secondary | ICD-10-CM | POA: Diagnosis not present

## 2020-03-19 DIAGNOSIS — Z8673 Personal history of transient ischemic attack (TIA), and cerebral infarction without residual deficits: Secondary | ICD-10-CM

## 2020-03-19 DIAGNOSIS — M5136 Other intervertebral disc degeneration, lumbar region: Secondary | ICD-10-CM | POA: Diagnosis not present

## 2020-03-19 DIAGNOSIS — M7061 Trochanteric bursitis, right hip: Secondary | ICD-10-CM | POA: Diagnosis not present

## 2020-03-19 DIAGNOSIS — Z5181 Encounter for therapeutic drug level monitoring: Secondary | ICD-10-CM | POA: Diagnosis not present

## 2020-03-19 DIAGNOSIS — D649 Anemia, unspecified: Secondary | ICD-10-CM

## 2020-03-19 DIAGNOSIS — M5030 Other cervical disc degeneration, unspecified cervical region: Secondary | ICD-10-CM | POA: Diagnosis not present

## 2020-03-19 DIAGNOSIS — Z952 Presence of prosthetic heart valve: Secondary | ICD-10-CM | POA: Diagnosis not present

## 2020-03-19 DIAGNOSIS — M51369 Other intervertebral disc degeneration, lumbar region without mention of lumbar back pain or lower extremity pain: Secondary | ICD-10-CM

## 2020-03-19 DIAGNOSIS — D6859 Other primary thrombophilia: Secondary | ICD-10-CM

## 2020-03-19 DIAGNOSIS — D472 Monoclonal gammopathy: Secondary | ICD-10-CM

## 2020-03-19 DIAGNOSIS — M19042 Primary osteoarthritis, left hand: Secondary | ICD-10-CM

## 2020-03-19 DIAGNOSIS — G8929 Other chronic pain: Secondary | ICD-10-CM

## 2020-03-19 LAB — CBC WITH DIFFERENTIAL (CANCER CENTER ONLY)
Abs Immature Granulocytes: 0.03 10*3/uL (ref 0.00–0.07)
Basophils Absolute: 0 10*3/uL (ref 0.0–0.1)
Basophils Relative: 0 %
Eosinophils Absolute: 0.3 10*3/uL (ref 0.0–0.5)
Eosinophils Relative: 4 %
HCT: 35.2 % — ABNORMAL LOW (ref 39.0–52.0)
Hemoglobin: 11.2 g/dL — ABNORMAL LOW (ref 13.0–17.0)
Immature Granulocytes: 0 %
Lymphocytes Relative: 9 %
Lymphs Abs: 0.6 10*3/uL — ABNORMAL LOW (ref 0.7–4.0)
MCH: 28.6 pg (ref 26.0–34.0)
MCHC: 31.8 g/dL (ref 30.0–36.0)
MCV: 89.8 fL (ref 80.0–100.0)
Monocytes Absolute: 0.4 10*3/uL (ref 0.1–1.0)
Monocytes Relative: 5 %
Neutro Abs: 5.4 10*3/uL (ref 1.7–7.7)
Neutrophils Relative %: 82 %
Platelet Count: 203 10*3/uL (ref 150–400)
RBC: 3.92 MIL/uL — ABNORMAL LOW (ref 4.22–5.81)
RDW: 14 % (ref 11.5–15.5)
WBC Count: 6.7 10*3/uL (ref 4.0–10.5)
nRBC: 0 % (ref 0.0–0.2)

## 2020-03-19 LAB — FERRITIN: Ferritin: 31 ng/mL (ref 24–336)

## 2020-03-20 ENCOUNTER — Ambulatory Visit (INDEPENDENT_AMBULATORY_CARE_PROVIDER_SITE_OTHER): Payer: Medicare Other | Admitting: Cardiovascular Disease

## 2020-03-20 ENCOUNTER — Encounter: Payer: Self-pay | Admitting: Cardiovascular Disease

## 2020-03-20 ENCOUNTER — Other Ambulatory Visit: Payer: Self-pay | Admitting: Cardiovascular Disease

## 2020-03-20 ENCOUNTER — Telehealth: Payer: Self-pay

## 2020-03-20 VITALS — BP 118/77 | HR 67 | Ht 73.0 in | Wt 222.5 lb

## 2020-03-20 DIAGNOSIS — I10 Essential (primary) hypertension: Secondary | ICD-10-CM | POA: Diagnosis not present

## 2020-03-20 DIAGNOSIS — I5032 Chronic diastolic (congestive) heart failure: Secondary | ICD-10-CM

## 2020-03-20 DIAGNOSIS — I5022 Chronic systolic (congestive) heart failure: Secondary | ICD-10-CM | POA: Diagnosis not present

## 2020-03-20 DIAGNOSIS — I25119 Atherosclerotic heart disease of native coronary artery with unspecified angina pectoris: Secondary | ICD-10-CM | POA: Diagnosis not present

## 2020-03-20 DIAGNOSIS — I4819 Other persistent atrial fibrillation: Secondary | ICD-10-CM

## 2020-03-20 MED ORDER — ISOSORBIDE MONONITRATE ER 30 MG PO TB24: 15.0000 mg | ORAL_TABLET | Freq: Every day | ORAL | 3 refills | Status: DC

## 2020-03-20 MED FILL — ISOSORBIDE MN ER 30 MG TAB: 30 | 90 days supply | Qty: 45 | Fill #0

## 2020-03-20 NOTE — Telephone Encounter (Signed)
-----   Message from Ladell Pier, MD sent at 03/19/2020  3:28 PM EST ----- Please call patient, hemoglobin is better, iron stores appear normal, follow-up as scheduled

## 2020-03-20 NOTE — Progress Notes (Signed)
Cardiology Office Note:    Date:  03/21/2020   ID:  Kevin KassEugene S Dufner, MD, DOB 04/20/1934, MRN [id number]  PCP:  Kermit Baloeed, Tiffany L, DO  [facility] Cardiologist:  Tonny BollmanMichael Autry Droege, MD  [facility] Electrophysiologist:  None   Referring MD: Hali MarryAltheimer, Terrea Bruster, MD   Chief Complaint  Patient presents with  . Shortness of Breath    History of Present Illness:    Kevin KassEugene S Mccannon, MD is a 86 y.o. male with a complex medical and cardiovascular history presenting for follow-up evaluation today. I saw him last 11/26/2019. Medical conditions include longstanding atypical atrial flutter/atrial tachycardia, coronary artery disease, aortic valve disease status post bioprosthetic aortic valve replacement, orthostatic hypotension, and chronic diastolic heart failure.   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 He is experiencing some angina with physical exertion. He describes a tightness in the center of his chest. This is not occurring frequently but when he walks for any significant distance he experiences this. Chronic dyspnea is unchanged. No edema, orthopnea, or PND. No palpitations but he has noticed irregularities on his watch. He still has labile BP readings at times.   Past Medical History:  Diagnosis Date  . Aortic stenosis    moderate aortic stenosis  . Arthritis   . Benign prostatic hypertrophy   . Chronic systolic heart failure (HCC)   . 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  . Coronary artery disease    status post multiple prior percutaneous coronary interventions, microvascular angina per Dr Juanda ChanceBrodie  . Diverticulitis sigmoid colon recurrent   . Dysfunctional autonomic nervous system   . Dyspnea   . Heart murmur   . History of primary hypertension   . Hypercoagulable state (HCC)    chronically anticoagulated with coumadin  .  Hyperlipidemia   . Hyperthyroidism   . Hypothyroidism    Dr. Leslie DalesAltheimer  . MGUS (monoclonal gammopathy of unknown significance) 02/17/2013  . Ocular myasthenia (HCC)   . Osteoarthritis   . Paroxysmal atrial fibrillation (HCC)    DR. Riley KillSTuckey,   . Prediabetes 09/21/2017  . Stroke [facility](HCC)    1990    Past Surgical History:  Procedure Laterality Date  . AORTIC VALVE REPLACEMENT  03/15/2011   Procedure: AORTIC VALVE REPLACEMENT (AVR);  Surgeon: Alleen BorneBryan K Bartle, MD;  [facility];  Service: Open Heart Surgery;  Laterality: N/A;  . APPENDECTOMY    . CARDIAC CATHETERIZATION     11  . CARDIOVERSION    . CARDIOVERSION  04/15/2011   Procedure: CARDIOVERSION;  Surgeon: Marca Anconaalton McLean, MD;  [facility];  Service: Cardiovascular;  Laterality: N/A;  . CARDIOVERSION N/A 09/11/2014   Procedure: CARDIOVERSION;  Surgeon: Quintella Reichertraci R Turner, MD;  [facility];  Service: Cardiovascular;  Laterality: N/A;  . CARDIOVERSION N/A 06/27/2015   Procedure: CARDIOVERSION;  Surgeon: Vesta MixerPhilip J Nahser, MD;  [facility];  Service: Cardiovascular;  Laterality: N/A;  . CARDIOVERSION N/A 07/04/2015   Procedure: CARDIOVERSION;  Surgeon: Marinus MawGregg W Taylor, MD;  [facility];  Service: Cardiovascular;  Laterality: N/A;  . CARDIOVERSION N/A 04/13/2017   Procedure: CARDIOVERSION;  Surgeon: Quintella Reicherturner, Traci R, MD;  [facility];  Service: Cardiovascular;  Laterality: N/A;  . COLONOSCOPY    . CORONARY STENT INTERVENTION N/A 10/15/2019   Procedure: CORONARY STENT INTERVENTION;  Surgeon: Corky CraftsVaranasi, Jayadeep S, MD;  [facility];  Service:  Cardiovascular;  Laterality: N/A;  . EP IMPLANTABLE DEVICE N/A 06/16/2015   Procedure: Pacemaker Implant;  Surgeon: Evans Lance, MD;  Location: [place] CV LAB;  Service: Cardiovascular;  Laterality: N/A;  . hemrrhoidectomy    . LEFT AND RIGHT HEART CATHETERIZATION WITH CORONARY ANGIOGRAM Bilateral 02/01/2011   Procedure: LEFT AND RIGHT  HEART CATHETERIZATION WITH CORONARY ANGIOGRAM;  Surgeon: Hillary Bow, MD;  [facility] LAB;  Service: Cardiovascular;  Laterality: Bilateral;  . LEFT HEART CATH AND CORONARY ANGIOGRAPHY N/A 10/15/2019   Procedure: LEFT HEART CATH AND CORONARY ANGIOGRAPHY;  Surgeon: Jettie Booze, MD;  Location: [place] CV LAB;  Service: Cardiovascular;  Laterality: N/A;  . MAZE  03/15/2011   Procedure: MAZE;  Surgeon: Gaye Pollack, MD;  Location: [place];  Service: Open Heart Surgery;  Laterality: N/A;  . PACEMAKER INSERTION  1991   Guidant PPM, most recent Generator Change by Dr Olevia Perches was 08/22/06  . RIGHT/LEFT HEART CATH AND CORONARY ANGIOGRAPHY N/A 07/06/2016   Procedure: Right/Left Heart Cath and Coronary Angiography;  Surgeon: Sherren Mocha, MD;  Location: [place] CV LAB;  Service: Cardiovascular;  Laterality: N/A;  . TEE WITHOUT CARDIOVERSION  04/15/2011   Procedure: TRANSESOPHAGEAL ECHOCARDIOGRAM (TEE);  Surgeon: Loralie Champagne, MD;  Location: [place];  Service: Cardiovascular;  Laterality: N/A;  . TEE WITHOUT CARDIOVERSION N/A 09/11/2014   Procedure: TRANSESOPHAGEAL ECHOCARDIOGRAM (TEE);  Surgeon: Sueanne Margarita, MD;  Location: [facility] ENDOSCOPY;  Service: Cardiovascular;  Laterality: N/A;    Current Medications: Current Meds  Medication Sig  . amLODipine (NORVASC) 5 MG tablet Take 1 tablet (5 mg total) by mouth at bedtime as needed.  Marland Kitchen apixaban (ELIQUIS) 5 MG TABS tablet Take 1 tablet (5 mg total) by mouth 2 (two) times daily.  Marland Kitchen aspirin 81 MG chewable tablet Chew by mouth daily.  . bisacodyl (DULCOLAX) 5 MG EC tablet Take 5 mg by mouth as needed (constipation).   . butalbital-acetaminophen-caffeine (FIORICET, ESGIC) 50-325-40 MG per tablet Take 1 tablet by mouth 2 (two) times daily as needed for headache (osteo-arthritis pain).   . Cholecalciferol (VITAMIN D3) 2000 units capsule Take 1,000 Units by mouth daily.   . Coenzyme Q10 100 MG capsule Take 100 mg by mouth daily.   .  diclofenac Sodium (VOLTAREN) 1 % GEL Apply 2 g topically 2 (two) times daily as needed (knee pain).  Marland Kitchen doxazosin (CARDURA) 4 MG tablet Take 4 mg by mouth at bedtime.   Marland Kitchen ezetimibe (ZETIA) 10 MG tablet Take 1 tablet (10 mg total) by mouth daily.  Marland Kitchen HYDROcodone-acetaminophen (NORCO/VICODIN) 5-325 MG tablet TAKE 1 TABLET BY MOUTH 3 TIMES DAILY AS NEEDED FOR MODERATE PAIN  . hydrocortisone sodium succinate (SOLU-CORTEF) 100 MG SOLR injection Inject 100 mL in the muscle as needed when you cannot take Prednisone by mouth  . isosorbide mononitrate (IMDUR) 30 MG 24 hr tablet Take 0.5 tablets (15 mg total) by mouth daily.  Marland Kitchen LORazepam (ATIVAN) 1 MG tablet TAKE 1 TABLET BY MOUTH AT BEDTIME. MAY ALSO TAKE 1/2 OF A TABLET DAILY AS NEEDED FOR ANXIETY.  . magnesium oxide (MAG-OX) 400 MG tablet Take 400 mg by mouth daily.  . metoprolol succinate (TOPROL-XL) 100 MG 24 hr tablet Take 1 tablet (100 mg total) by mouth 2 (two) times daily. Take with or immediately following a meal.  . nitroGLYCERIN (NITROSTAT) 0.4 MG SL tablet Place 1 tablet (0.4 mg total) under the tongue every 5 (five) minutes as needed for chest pain.  Marland Kitchen  OVER THE COUNTER MEDICATION Apply 1 patch topically daily as needed (neck and shoulder pain). Chinese pain patch  . pantoprazole (PROTONIX) 40 MG tablet Take 1 tablet (40 mg total) by mouth daily.  . polyethylene glycol (MIRALAX / GLYCOLAX) 17 g packet Take 17 g by mouth daily as needed for mild constipation.   . potassium chloride SA (KLOR-CON) 20 MEQ tablet Take 20 mEq by mouth daily.  . predniSONE (DELTASONE) 5 MG tablet Take 5 mg by mouth daily with breakfast.  . rosuvastatin (CRESTOR) 20 MG tablet Take 1 tablet (20 mg total) by mouth at bedtime.     Allergies:   Contrast media [iodinated diagnostic agents], Gadolinium derivatives, and Metrizamide   Social History   Socioeconomic History  . Marital status: Married    Spouse name: Baker Janus   . Number of children: 3  . Years of education: Not  on file  . Highest education level: Not on file  Occupational History  . Occupation: Retired    Comment: Physician  Tobacco Use  . Smoking status: Never Smoker  . Smokeless tobacco: Never Used  Vaping Use  . Vaping Use: Never used  Substance and Sexual Activity  . Alcohol use: No    Alcohol/week: 0.0 standard drinks  . Drug use: No  . Sexual activity: Not Currently  Other Topics Concern  . Not on file  Social History Narrative   Married to [place]. Has grown children   Retired Horticulturist, commercial MD      Never smoker no alcohol      Social Determinants of Radio broadcast assistant Strain: Not on file  Food Insecurity: Not on file  Transportation Needs: Not on file  Physical Activity: Not on file  Stress: Not on file  Social Connections: Not on file     Family History: The patient's family history includes Anorexia nervosa in his daughter; Atrial fibrillation in his brother; CAD in his brother; Depression in his daughter; Heart disease in his brother; Hypertension in his son; Sarcoidosis in his brother. There is no history of Anesthesia problems, Hypotension, Malignant hyperthermia, or Pseudochol deficiency.  ROS:   Please see the history of present illness.    All other systems reviewed and are negative.  EKGs/Labs/Other Studies Reviewed:    The following studies were reviewed today: Echo 01-18-2020:  1. Left ventricular ejection fraction, by estimation, is 45 to 50%. The  left ventricle has mildly decreased function. The left ventricle  demonstrates regional wall motion abnormalities (see scoring  diagram/findings for description). There is mild left  ventricular hypertrophy. Left ventricular diastolic function could not be  evaluated. There is severe akinesis of the left ventricular, basal-mid  inferior wall suggestive of scar.  2. Left atrial size was moderately dilated.  3. The mitral valve is abnormal. Mild mitral valve regurgitation.  4. The aortic valve has been  repaired/replaced with a bioprosthetic valve  (2013). Aortic valve area, by VTI measures 1.27 cm. Aortic valve mean  gradient measures 9.0 mmHg. Aortic valve Vmax measures 2.05 m/s. DI of  0.33.  5. Aortic dilatation noted. There is mild dilatation of the ascending  aorta, measuring 41 mm.  6. Right atrial size was mildly dilated.  7. Right ventricular systolic function is low normal. The right  ventricular size is normal. There is normal pulmonary artery systolic  pressure.  8. The inferior vena cava is dilated in size with >50% respiratory  variability, suggesting right atrial pressure of 8 mmHg.   Comparison(s): No significant  change from prior study. 10/15/2019: LVEF  45-50%, basal to mid inferior hypokinesis, bioprosthetic AVR - mean  gradient 7 mmHg.   Cardiac Cath 10-15-2019: Conclusion    Prox LAD to Mid LAD lesion is 40% stenosed.  Prox RCA lesion is 30% stenosed.  Mid RCA lesion is 40% stenosed.  Previously placed Mid Cx to Dist Cx stent (unknown type) is widely patent.  Dist RCA lesion is 75% stenosed. This was new compared to prior cath in 2018.  A drug-eluting stent was successfully placed using a SYNERGY XD 2.75X16. THis was dilated to 3.0 mm and postdilated to 3.4 mm.  Post intervention, there is a 0% residual stenosis.  There is moderate left ventricular systolic dysfunction.  LV end diastolic pressure is normal.  The left ventricular ejection fraction is 35-45% by visual estimate. Global hypokinesis.   No aspirin given Eliquis and Plavix.  Could use Plavix longer if no bleeding issues, but bare minimum would be one month.    Continue aggressive secondary prevention and management of his tachycardia.   Diagnostic Dominance: Right  Left Main  Vessel is angiographically normal.  Left Anterior Descending  Prox LAD to Mid LAD lesion is 40% stenosed. There is mild diffuse proximal LAD stenosis unchanged from previous study. The LAD wraps around the  LV apex  Left Circumflex  Previously placed Mid Cx to Dist Cx stent (unknown type) is widely patent.  Right Coronary Artery  There is mild diffuse disease throughout the vessel. Large, dominant vessel. There are diffuse irregularities with mild stenoses in the proximal and mid vessel. The distal vessel is widely patent. The PDA and PLA branches are patent.  Prox RCA lesion is 30% stenosed. The lesion is calcified.  Mid RCA lesion is 40% stenosed. The lesion is calcified.  Dist RCA lesion is 75% stenosed.   Intervention   Dist RCA lesion  Stent  CATH LAUNCHER 6FR JR4 guide catheter was inserted. Lesion crossed with guidewire using a WIRE ASAHI PROWATER 180CM. Pre-stent angioplasty was performed using a BALLOON SAPPHIRE 2.5X12. A drug-eluting stent was successfully placed using a SYNERGY XD 2.75X16. Stent strut is well apposed. Post-stent angioplasty was performed using a BALLOON SAPPHIRE [place] 3.25X12.  Post-Intervention Lesion Assessment  The intervention was successful. Pre-interventional TIMI flow is 3. Post-intervention TIMI flow is 3. No complications occurred at this lesion.  There is a 0% residual stenosis post intervention.     EKG:  EKG is not ordered today.   Recent Labs: 10/14/2019: TSH 2.231 10/15/2019: Magnesium 2.2 01/11/2020: ALT 20; BUN 11; Creatinine 1.00; Potassium 4.2; Sodium 141 03/19/2020: Hemoglobin 11.2; Platelet Count 203  Recent Lipid Panel    Component Value Date/Time   CHOL 134 10/16/2019 0355   CHOL 158 09/02/2017 1006   TRIG 55 10/16/2019 0355   HDL 78 10/16/2019 0355   HDL 72 09/02/2017 1006   CHOLHDL 1.7 10/16/2019 0355   VLDL 11 10/16/2019 0355   LDLCALC 45 10/16/2019 0355   LDLCALC 68 09/02/2017 1006   LDLDIRECT 117.9 11/16/2010 1355     Risk Assessment/Calculations:    CHA2DS2-VASc Score = 5  This indicates a 7.2% annual risk of stroke. The patient's score is based upon: CHF History: Yes HTN History: Yes Diabetes History: No Stroke  History: No Vascular Disease History: Yes Age Score: 2 Gender Score: 0      Physical Exam:    VS:  BP 118/77   Pulse 67   Ht 6\' 1"  (1.854 m)   Wt 222 lb  8 oz (100.9 kg)   SpO2 97%   BMI 29.36 kg/m     Wt Readings from Last 3 Encounters:  03/20/20 222 lb 8 oz (100.9 kg)  03/19/20 224 lb 8 oz (101.8 kg)  02/27/20 225 lb (102.1 kg)     GEN: Well nourished, well developed in no acute distress HEENT: Normal NECK: No JVD; No carotid bruits LYMPHATICS: No lymphadenopathy CARDIAC: RRR, soft SEM at the RUSB RESPIRATORY:  Clear to auscultation without rales, wheezing or rhonchi  ABDOMEN: Soft, non-tender, non-distended MUSCULOSKELETAL:  No edema; No deformity  SKIN: Warm and dry NEUROLOGIC:  Alert and oriented x 3 PSYCHIATRIC:  Normal affect   ASSESSMENT:    1. Chronic systolic heart failure ([place])   2. Coronary artery disease involving native coronary artery of native heart with angina pectoris (HCC)   3. Persistent atrial fibrillation ([place])   4. Essential hypertension    PLAN:    In order of problems listed above:  1. We reviewed his most recent echo showing a LVEF of 45%, unchanged from his last echo. It appears that when he was hospitalized with NSTEMI/demand ischemia in the setting of febrile illness, that he sustained an inferior infarct as he has a persistent inferior wall motion abnormality on echo. He appears stable at this time. Will repeat an echo when he returns for follow-up in 6 months.  2. Remains on ASA in the setting of chronic oral anticoagulation. Add isosorbide 15 mg x 2 weeks, then 30 mg daily. Continue toprol XL at current dose.  3. Tolerating anticoagulation. Continue metoprolol succinate. Use of prn metoprolol tartrate and diltiazem discussed in the case of elevated HR. 4. Controlled.        Medication Adjustments/Labs and Tests Ordered: Current medicines are reviewed at length with the patient today.  Concerns regarding medicines are outlined  above.  Orders Placed This Encounter  Procedures  . ECHOCARDIOGRAM COMPLETE   Meds ordered this encounter  Medications  . isosorbide mononitrate (IMDUR) 30 MG 24 hr tablet    Sig: Take 0.5 tablets (15 mg total) by mouth daily.    Dispense:  45 tablet    Refill:  3    Patient Instructions  Medication Instructions:  1) START IMDUR 15 mg for 2 weeks. If you do well on this dose, increase to 30 mg daily *If you need a refill on your cardiac medications before your next appointment, please call your pharmacy*  Follow-Up: You will be called to arrange your 6 month echo and office visit with Dr. Burt Knack.    Signed, Sherren Mocha, MD  03/21/2020 1:25 PM    [facility]

## 2020-03-20 NOTE — Telephone Encounter (Signed)
Pt contacted per Dr. Benay Spice and informed Hgb is better and iron stores appear normal. Pt to follow-up as scheduled.

## 2020-03-20 NOTE — Patient Instructions (Signed)
Medication Instructions:  1) START IMDUR 15 mg for 2 weeks. If you do well on this dose, increase to 30 mg daily *If you need a refill on your cardiac medications before your next appointment, please call your pharmacy*  Follow-Up: You will be called to arrange your 6 month echo and office visit with Dr. Burt Knack.

## 2020-03-21 NOTE — Progress Notes (Signed)
Tramadol is not detected.  He is not on tramadol.  He is on hydrocodone.

## 2020-03-22 LAB — DRUG MONITOR, TRAMADOL,QN, URINE
Desmethyltramadol: NEGATIVE ng/mL (ref <100)
Tramadol: NEGATIVE ng/mL (ref <100)

## 2020-03-22 LAB — DRUG MONITOR, PANEL 5, W/CONF, URINE
Alphahydroxyalprazolam: NEGATIVE ng/mL (ref <25)
Alphahydroxymidazolam: NEGATIVE ng/mL (ref <50)
Alphahydroxytriazolam: NEGATIVE ng/mL (ref <50)
Aminoclonazepam: NEGATIVE ng/mL (ref <25)
Amobarbital: NEGATIVE ng/mL (ref <100)
Amphetamines: NEGATIVE ng/mL (ref <500)
Barbiturates: POSITIVE ng/mL — AB (ref <300)
Benzodiazepines: POSITIVE ng/mL — AB (ref <100)
Butalbital: 838 ng/mL — ABNORMAL HIGH (ref <100)
Cocaine Metabolite: NEGATIVE ng/mL (ref <150)
Codeine: NEGATIVE ng/mL (ref <50)
Creatinine: 65.1 mg/dL
Hydrocodone: 123 ng/mL — ABNORMAL HIGH (ref <50)
Hydromorphone: 200 ng/mL — ABNORMAL HIGH (ref <50)
Hydroxyethylflurazepam: NEGATIVE ng/mL (ref <50)
Lorazepam: 346 ng/mL — ABNORMAL HIGH (ref <50)
Marijuana Metabolite: NEGATIVE ng/mL (ref <20)
Methadone Metabolite: NEGATIVE ng/mL (ref <100)
Morphine: NEGATIVE ng/mL (ref <50)
Nordiazepam: NEGATIVE ng/mL (ref <50)
Norhydrocodone: 469 ng/mL — ABNORMAL HIGH (ref <50)
Opiates: POSITIVE ng/mL — AB (ref <100)
Oxazepam: NEGATIVE ng/mL (ref <50)
Oxidant: NEGATIVE ug/mL
Oxycodone: NEGATIVE ng/mL (ref <100)
Pentobarbital: NEGATIVE ng/mL (ref <100)
Phenobarbital: NEGATIVE ng/mL (ref <100)
Secobarbital: NEGATIVE ng/mL (ref <100)
Temazepam: NEGATIVE ng/mL (ref <50)
pH: 7.5 (ref 4.5–9.0)

## 2020-03-22 LAB — DM TEMPLATE

## 2020-03-23 NOTE — Progress Notes (Signed)
UDS is consistent with the treatment.

## 2020-03-27 ENCOUNTER — Other Ambulatory Visit: Payer: Self-pay | Admitting: Cardiovascular Disease

## 2020-03-27 MED FILL — POTASSIUM CHLORIDE CRYS ER: 20 | 90 days supply | Qty: 90 | Fill #0

## 2020-03-27 MED FILL — ELIQUIS 5 MG TABLET: 5 | 90 days supply | Qty: 180 | Fill #0

## 2020-03-27 MED FILL — METOPROLOL SUCCINATE ER 100: 100 | 30 days supply | Qty: 60 | Fill #0

## 2020-03-27 NOTE — Telephone Encounter (Signed)
Prescription refill request for Eliquis received.  Indication: Afib Last office visit: Burt Knack, 03/20/2020 Scr: 1.00, 01/11/2020 Age: 86 yo  Weight: 100.9 kg   Prescription refill sent.

## 2020-04-02 ENCOUNTER — Other Ambulatory Visit: Payer: Self-pay | Admitting: Rheumatology

## 2020-04-02 MED FILL — HYDROCODON-APAP 5-325: 5-325 | 30 days supply | Qty: 90 | Fill #0

## 2020-04-02 NOTE — Telephone Encounter (Signed)
Last Visit: 03/19/2020 Next Visit: 06/04/2020 UDS:03/19/2020, UDS is consistent with the treatment. Narc Agreement: 12/04/2019  Last Fill: 03/03/2020, Other chronic pain - hydrocodone to 5 -325 mg 3 times daily as needed.  Okay to refill Hydrocodone?

## 2020-04-04 MED FILL — LORAZEPAM 1 MG TABS: 1 | 30 days supply | Qty: 45 | Fill #2

## 2020-04-10 ENCOUNTER — Telehealth: Payer: Self-pay

## 2020-04-10 NOTE — Telephone Encounter (Signed)
Called Dr. Lorin Picket to see how he is tolerating the Imdur 15 mg daily and to update prescription for 30 mg. He states he took 1/4 tablet one time and got a bad headache, his chest felt full, and he just felt "bad." the episode was pretty immediate and lasted about 1-1.5 hours. He has not taken the medication since.   He also states his blood pressure is fluctuating. Last week, his BP at one point was over 200/100. He had a headache at the time. He took his medications and felt better but didn't recheck his BP. Reiterated the importance of taking meds as directed and at the same time every day.  His most recent BP readings have been "fine."  He is unsure whether or not to try Imdur again. Instructed him to hold the medication and Dr. Burt Knack will be asked for recommendations. He was grateful for call and agrees with plan.

## 2020-04-10 NOTE — Telephone Encounter (Signed)
Wouldn't try it again. Would continue regular meds. Stop Imdur. thanks

## 2020-04-10 NOTE — Telephone Encounter (Signed)
Per DPR form, left detailed message for patient to STOP IMDUR. Instructed him to continue to monitor BP and to call if it is consistently elevated.

## 2020-04-16 ENCOUNTER — Ambulatory Visit: Payer: Medicare Other | Admitting: Cardiovascular Disease

## 2020-04-18 ENCOUNTER — Telehealth: Payer: Self-pay

## 2020-04-18 NOTE — Telephone Encounter (Signed)
Faxed PT order

## 2020-04-18 NOTE — Telephone Encounter (Signed)
[name] from the therapy department at [facility] left a voicemail stating Dr. Estanislado Pandy had written a physical therapy order for Dr. Velora Heckler on 03/19/20.  He has decided that he would like to pursue physical therapy, however, in the time span we can evaluate him this order will be expired.  We are requesting a new physical therapy order be faxed to [phone].  If you have any questions, please call back at [phone]

## 2020-04-18 NOTE — Telephone Encounter (Signed)
Okay to send a new physical therapy order for osteoarthritis of bilateral shoulders.

## 2020-04-21 ENCOUNTER — Encounter: Payer: Self-pay | Admitting: Internal Medicine

## 2020-04-28 DIAGNOSIS — M19011 Primary osteoarthritis, right shoulder: Secondary | ICD-10-CM | POA: Diagnosis not present

## 2020-04-28 DIAGNOSIS — M25512 Pain in left shoulder: Secondary | ICD-10-CM | POA: Diagnosis not present

## 2020-04-28 DIAGNOSIS — M25511 Pain in right shoulder: Secondary | ICD-10-CM | POA: Diagnosis not present

## 2020-04-28 DIAGNOSIS — M19012 Primary osteoarthritis, left shoulder: Secondary | ICD-10-CM | POA: Diagnosis not present

## 2020-04-28 DIAGNOSIS — M7061 Trochanteric bursitis, right hip: Secondary | ICD-10-CM | POA: Diagnosis not present

## 2020-04-28 DIAGNOSIS — M25652 Stiffness of left hip, not elsewhere classified: Secondary | ICD-10-CM | POA: Diagnosis not present

## 2020-04-28 DIAGNOSIS — M25551 Pain in right hip: Secondary | ICD-10-CM | POA: Diagnosis not present

## 2020-04-28 DIAGNOSIS — M47812 Spondylosis without myelopathy or radiculopathy, cervical region: Secondary | ICD-10-CM | POA: Diagnosis not present

## 2020-04-28 DIAGNOSIS — M7062 Trochanteric bursitis, left hip: Secondary | ICD-10-CM | POA: Diagnosis not present

## 2020-04-28 DIAGNOSIS — M25612 Stiffness of left shoulder, not elsewhere classified: Secondary | ICD-10-CM | POA: Diagnosis not present

## 2020-04-28 DIAGNOSIS — M25651 Stiffness of right hip, not elsewhere classified: Secondary | ICD-10-CM | POA: Diagnosis not present

## 2020-04-28 DIAGNOSIS — M25611 Stiffness of right shoulder, not elsewhere classified: Secondary | ICD-10-CM | POA: Diagnosis not present

## 2020-04-30 DIAGNOSIS — M25612 Stiffness of left shoulder, not elsewhere classified: Secondary | ICD-10-CM | POA: Diagnosis not present

## 2020-04-30 DIAGNOSIS — M7062 Trochanteric bursitis, left hip: Secondary | ICD-10-CM | POA: Diagnosis not present

## 2020-04-30 DIAGNOSIS — M25512 Pain in left shoulder: Secondary | ICD-10-CM | POA: Diagnosis not present

## 2020-04-30 DIAGNOSIS — M19012 Primary osteoarthritis, left shoulder: Secondary | ICD-10-CM | POA: Diagnosis not present

## 2020-04-30 DIAGNOSIS — M25611 Stiffness of right shoulder, not elsewhere classified: Secondary | ICD-10-CM | POA: Diagnosis not present

## 2020-04-30 DIAGNOSIS — M47812 Spondylosis without myelopathy or radiculopathy, cervical region: Secondary | ICD-10-CM | POA: Diagnosis not present

## 2020-04-30 DIAGNOSIS — M25651 Stiffness of right hip, not elsewhere classified: Secondary | ICD-10-CM | POA: Diagnosis not present

## 2020-04-30 DIAGNOSIS — M19011 Primary osteoarthritis, right shoulder: Secondary | ICD-10-CM | POA: Diagnosis not present

## 2020-04-30 DIAGNOSIS — M25652 Stiffness of left hip, not elsewhere classified: Secondary | ICD-10-CM | POA: Diagnosis not present

## 2020-04-30 DIAGNOSIS — M25511 Pain in right shoulder: Secondary | ICD-10-CM | POA: Diagnosis not present

## 2020-04-30 DIAGNOSIS — M7061 Trochanteric bursitis, right hip: Secondary | ICD-10-CM | POA: Diagnosis not present

## 2020-04-30 DIAGNOSIS — M25551 Pain in right hip: Secondary | ICD-10-CM | POA: Diagnosis not present

## 2020-05-02 ENCOUNTER — Other Ambulatory Visit: Payer: Self-pay | Admitting: Rheumatology

## 2020-05-02 MED FILL — BUTALB-ACETAMIN-CAFF 50-325: 50-325-40 | 90 days supply | Qty: 180 | Fill #2

## 2020-05-02 MED FILL — LORAZEPAM 1 MG TABS: 1 | 30 days supply | Qty: 45 | Fill #3

## 2020-05-02 MED FILL — HYDROCODON-APAP 5-325: 5-325 | 30 days supply | Qty: 90 | Fill #0

## 2020-05-02 NOTE — Telephone Encounter (Signed)
Last Visit: 03/19/2020 Next Visit: 06/04/2020 UDS: 03/19/2020, UDS is consistent with the treatment. Narc Agreement: 03/14/2020  Last Fill: 04/02/2020, Other chronic painhydrocodone to 5 -325 mg 3 times daily as needed  Okay to refill Hydrocodone?

## 2020-05-05 ENCOUNTER — Telehealth: Payer: Self-pay | Admitting: Cardiovascular Disease

## 2020-05-05 DIAGNOSIS — M25511 Pain in right shoulder: Secondary | ICD-10-CM | POA: Diagnosis not present

## 2020-05-05 DIAGNOSIS — M19011 Primary osteoarthritis, right shoulder: Secondary | ICD-10-CM | POA: Diagnosis not present

## 2020-05-05 DIAGNOSIS — M25651 Stiffness of right hip, not elsewhere classified: Secondary | ICD-10-CM | POA: Diagnosis not present

## 2020-05-05 DIAGNOSIS — M25512 Pain in left shoulder: Secondary | ICD-10-CM | POA: Diagnosis not present

## 2020-05-05 DIAGNOSIS — M7061 Trochanteric bursitis, right hip: Secondary | ICD-10-CM | POA: Diagnosis not present

## 2020-05-05 DIAGNOSIS — M25612 Stiffness of left shoulder, not elsewhere classified: Secondary | ICD-10-CM | POA: Diagnosis not present

## 2020-05-05 DIAGNOSIS — M47812 Spondylosis without myelopathy or radiculopathy, cervical region: Secondary | ICD-10-CM | POA: Diagnosis not present

## 2020-05-05 DIAGNOSIS — M25611 Stiffness of right shoulder, not elsewhere classified: Secondary | ICD-10-CM | POA: Diagnosis not present

## 2020-05-05 DIAGNOSIS — M25551 Pain in right hip: Secondary | ICD-10-CM | POA: Diagnosis not present

## 2020-05-05 DIAGNOSIS — M7062 Trochanteric bursitis, left hip: Secondary | ICD-10-CM | POA: Diagnosis not present

## 2020-05-05 DIAGNOSIS — M19012 Primary osteoarthritis, left shoulder: Secondary | ICD-10-CM | POA: Diagnosis not present

## 2020-05-05 DIAGNOSIS — M25652 Stiffness of left hip, not elsewhere classified: Secondary | ICD-10-CM | POA: Diagnosis not present

## 2020-05-05 NOTE — Telephone Encounter (Signed)
New Message:     Dr Caprice Red would like to change from Dr Burt Knack to Dr Harrell Gave. Is that alright with both of you?

## 2020-05-05 NOTE — Telephone Encounter (Signed)
Yes this is fine with me. Thank you

## 2020-05-05 NOTE — Telephone Encounter (Signed)
Ok by me. Thanks.

## 2020-05-06 NOTE — Telephone Encounter (Signed)
The patient states he would like to be established with a doctor at [facility] since it will be close to his facility. He is scheduled with Dr. Harrell Gave next Friday, 05/16/2020 to reestablish care. He states if he ever needs a heart catheterization he would like Dr. Burt Knack to do it. He was grateful for follow-up.

## 2020-05-07 DIAGNOSIS — M25551 Pain in right hip: Secondary | ICD-10-CM | POA: Diagnosis not present

## 2020-05-07 DIAGNOSIS — M19011 Primary osteoarthritis, right shoulder: Secondary | ICD-10-CM | POA: Diagnosis not present

## 2020-05-07 DIAGNOSIS — M25611 Stiffness of right shoulder, not elsewhere classified: Secondary | ICD-10-CM | POA: Diagnosis not present

## 2020-05-07 DIAGNOSIS — M25652 Stiffness of left hip, not elsewhere classified: Secondary | ICD-10-CM | POA: Diagnosis not present

## 2020-05-07 DIAGNOSIS — M25651 Stiffness of right hip, not elsewhere classified: Secondary | ICD-10-CM | POA: Diagnosis not present

## 2020-05-07 DIAGNOSIS — M47812 Spondylosis without myelopathy or radiculopathy, cervical region: Secondary | ICD-10-CM | POA: Diagnosis not present

## 2020-05-07 DIAGNOSIS — M25612 Stiffness of left shoulder, not elsewhere classified: Secondary | ICD-10-CM | POA: Diagnosis not present

## 2020-05-07 DIAGNOSIS — M25511 Pain in right shoulder: Secondary | ICD-10-CM | POA: Diagnosis not present

## 2020-05-07 DIAGNOSIS — M19012 Primary osteoarthritis, left shoulder: Secondary | ICD-10-CM | POA: Diagnosis not present

## 2020-05-07 DIAGNOSIS — M25512 Pain in left shoulder: Secondary | ICD-10-CM | POA: Diagnosis not present

## 2020-05-07 DIAGNOSIS — M7062 Trochanteric bursitis, left hip: Secondary | ICD-10-CM | POA: Diagnosis not present

## 2020-05-07 DIAGNOSIS — M7061 Trochanteric bursitis, right hip: Secondary | ICD-10-CM | POA: Diagnosis not present

## 2020-05-09 ENCOUNTER — Ambulatory Visit (INDEPENDENT_AMBULATORY_CARE_PROVIDER_SITE_OTHER): Payer: Medicare Other

## 2020-05-09 DIAGNOSIS — I5022 Chronic systolic (congestive) heart failure: Secondary | ICD-10-CM

## 2020-05-09 DIAGNOSIS — I4891 Unspecified atrial fibrillation: Secondary | ICD-10-CM

## 2020-05-12 DIAGNOSIS — M7061 Trochanteric bursitis, right hip: Secondary | ICD-10-CM | POA: Diagnosis not present

## 2020-05-12 DIAGNOSIS — M25612 Stiffness of left shoulder, not elsewhere classified: Secondary | ICD-10-CM | POA: Diagnosis not present

## 2020-05-12 DIAGNOSIS — M25652 Stiffness of left hip, not elsewhere classified: Secondary | ICD-10-CM | POA: Diagnosis not present

## 2020-05-12 DIAGNOSIS — M25512 Pain in left shoulder: Secondary | ICD-10-CM | POA: Diagnosis not present

## 2020-05-12 DIAGNOSIS — M25551 Pain in right hip: Secondary | ICD-10-CM | POA: Diagnosis not present

## 2020-05-12 DIAGNOSIS — M25651 Stiffness of right hip, not elsewhere classified: Secondary | ICD-10-CM | POA: Diagnosis not present

## 2020-05-12 DIAGNOSIS — M25511 Pain in right shoulder: Secondary | ICD-10-CM | POA: Diagnosis not present

## 2020-05-12 DIAGNOSIS — M47812 Spondylosis without myelopathy or radiculopathy, cervical region: Secondary | ICD-10-CM | POA: Diagnosis not present

## 2020-05-12 DIAGNOSIS — M19012 Primary osteoarthritis, left shoulder: Secondary | ICD-10-CM | POA: Diagnosis not present

## 2020-05-12 DIAGNOSIS — M25611 Stiffness of right shoulder, not elsewhere classified: Secondary | ICD-10-CM | POA: Diagnosis not present

## 2020-05-12 DIAGNOSIS — M7062 Trochanteric bursitis, left hip: Secondary | ICD-10-CM | POA: Diagnosis not present

## 2020-05-12 DIAGNOSIS — M19011 Primary osteoarthritis, right shoulder: Secondary | ICD-10-CM | POA: Diagnosis not present

## 2020-05-12 LAB — CUP PACEART REMOTE DEVICE CHECK
Battery Remaining Longevity: 78 mo
Battery Remaining Percentage: 78 %
Brady Statistic RA Percent Paced: 0 %
Brady Statistic RV Percent Paced: 51 %
Date Time Interrogation Session: 20220311021100
Implantable Lead Implant Date: 20170417
Implantable Lead Implant Date: 20170417
Implantable Lead Location: 753859
Implantable Lead Location: 753860
Implantable Lead Model: 7740
Implantable Lead Model: 7741
Implantable Lead Serial Number: 662696
Implantable Lead Serial Number: 751382
Implantable Pulse Generator Implant Date: 20170417
Lead Channel Impedance Value: 668 Ohm
Lead Channel Impedance Value: 692 Ohm
Lead Channel Setting Pacing Amplitude: 2.5 V
Lead Channel Setting Pacing Amplitude: 2.5 V
Lead Channel Setting Pacing Pulse Width: 0.4 ms
Lead Channel Setting Sensing Sensitivity: 2.5 mV
Pulse Gen Serial Number: 718418

## 2020-05-14 DIAGNOSIS — M25611 Stiffness of right shoulder, not elsewhere classified: Secondary | ICD-10-CM | POA: Diagnosis not present

## 2020-05-14 DIAGNOSIS — M25551 Pain in right hip: Secondary | ICD-10-CM | POA: Diagnosis not present

## 2020-05-14 DIAGNOSIS — M19012 Primary osteoarthritis, left shoulder: Secondary | ICD-10-CM | POA: Diagnosis not present

## 2020-05-14 DIAGNOSIS — M7061 Trochanteric bursitis, right hip: Secondary | ICD-10-CM | POA: Diagnosis not present

## 2020-05-14 DIAGNOSIS — M25511 Pain in right shoulder: Secondary | ICD-10-CM | POA: Diagnosis not present

## 2020-05-14 DIAGNOSIS — M19011 Primary osteoarthritis, right shoulder: Secondary | ICD-10-CM | POA: Diagnosis not present

## 2020-05-14 DIAGNOSIS — M25512 Pain in left shoulder: Secondary | ICD-10-CM | POA: Diagnosis not present

## 2020-05-14 DIAGNOSIS — M25652 Stiffness of left hip, not elsewhere classified: Secondary | ICD-10-CM | POA: Diagnosis not present

## 2020-05-14 DIAGNOSIS — M7062 Trochanteric bursitis, left hip: Secondary | ICD-10-CM | POA: Diagnosis not present

## 2020-05-14 DIAGNOSIS — M25651 Stiffness of right hip, not elsewhere classified: Secondary | ICD-10-CM | POA: Diagnosis not present

## 2020-05-14 DIAGNOSIS — M25612 Stiffness of left shoulder, not elsewhere classified: Secondary | ICD-10-CM | POA: Diagnosis not present

## 2020-05-14 DIAGNOSIS — M47812 Spondylosis without myelopathy or radiculopathy, cervical region: Secondary | ICD-10-CM | POA: Diagnosis not present

## 2020-05-14 NOTE — Progress Notes (Signed)
Cardiology Office Note:    Date:  05/16/2020   ID:  Kevin Buff, MD, DOB [birth year], MRN [id number]  PCP:  Gayland Curry, DO  Cardiologist:  Sherren Mocha, MD  Referring MD: Gayland Curry, DO   CC: transferring care from Dr. 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  He has been followed by Dr. Burt Knack in the past, last visit 03/20/20. At that visit, noted to have angina on exertion only. Noted also to have chronic dyspnea, labile BP. Also noted to have occasional irregular rhythms on his Apple watch, but he is asymptomatic. Imdur was added at that visit.  Today:  Brings a list of questions today. -last echo with reduced EF -has been in atrial arrhythmia, has felt more fatigued. HR goes up with exertion -hasn't tolerated imdur for angina.  -asking about LDL goal, last LDL 45.  Tried imdur, used a 1/4 pill and had a terrible headache. Has tried ranolazine in the past as well.   Last cath 10/15/19, received PCI to distal RCA. Did not feel any better after PCI, may even have felt a little worse. Had been fatigued prior.  Was on amiodarone for about 7 years, controlled his atach but caused loss of lung function. Has tried tikosyn in the past as well, QT prolonged and did not tolerate. Has had multiple cardioversions, hasn't lasted long.  Off both aspirin and clopidogrel, still on apixaban.  Labile blood pressures.  Has been seen at [facility], [facility], etc.   We discussed his history, workup/evaluation, and attempts at management at length today. See plan below.   Past Medical History:  Diagnosis Date   Aortic stenosis     moderate aortic stenosis   Arthritis    Benign prostatic hypertrophy    Chronic systolic heart failure (HCC)    Chronotropic incompetence with sinus node dysfunction (HCC)    Status post Guidant dual-mode, dual-pacing, dual-sensing  pacemaker   implantation now programmed to AAI with recent generator change.   Coronary artery disease    status post multiple prior percutaneous coronary interventions, microvascular angina per Dr Olevia Perches   Diverticulitis sigmoid colon recurrent    Dysfunctional autonomic nervous system    Dyspnea    Heart murmur    History of primary hypertension    Hypercoagulable state ([place])    chronically anticoagulated with coumadin   Hyperlipidemia    Hyperthyroidism    Hypothyroidism    Dr. 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 Lia Foyer,    Prediabetes 09/21/2017   Stroke ([place])    1990    Past Surgical History:  Procedure Laterality Date   AORTIC VALVE REPLACEMENT  03/15/2011   Procedure: AORTIC VALVE REPLACEMENT (AVR);  Surgeon: Gaye Pollack, MD;  Location: [place];  Service: Open Heart Surgery;  Laterality: N/A;   APPENDECTOMY     CARDIAC CATHETERIZATION     11   CARDIOVERSION     CARDIOVERSION  04/15/2011   Procedure: CARDIOVERSION;  Surgeon: Loralie Champagne, MD;  Location: [facility]  ENDOSCOPY;  Service: Cardiovascular;  Laterality: N/A;   CARDIOVERSION N/A 09/11/2014   Procedure: CARDIOVERSION;  Surgeon: Sueanne Margarita, MD;  Location: [place] ENDOSCOPY;  Service: Cardiovascular;  Laterality: N/A;   CARDIOVERSION N/A 06/27/2015   Procedure: CARDIOVERSION;  Surgeon: Thayer Headings, MD;  Location: [place];  Service: Cardiovascular;  Laterality: N/A;   CARDIOVERSION N/A 07/04/2015   Procedure: CARDIOVERSION;  Surgeon: Evans Lance, MD;  Location: [place];  Service: Cardiovascular;  Laterality: N/A;   CARDIOVERSION N/A 04/13/2017    Procedure: CARDIOVERSION;  Surgeon: Sueanne Margarita, MD;  Location: [facility] ENDOSCOPY;  Service: Cardiovascular;  Laterality: N/A;   COLONOSCOPY     CORONARY STENT INTERVENTION N/A 10/15/2019   Procedure: CORONARY STENT INTERVENTION;  Surgeon: Jettie Booze, MD;  Location: [place] CV LAB;  Service: Cardiovascular;  Laterality: N/A;   EP IMPLANTABLE DEVICE N/A 06/16/2015   Procedure: Pacemaker Implant;  Surgeon: Evans Lance, MD;  Location: [place] CV LAB;  Service: Cardiovascular;  Laterality: N/A;   hemrrhoidectomy     LEFT AND RIGHT HEART CATHETERIZATION WITH CORONARY ANGIOGRAM Bilateral 02/01/2011   Procedure: LEFT AND RIGHT HEART CATHETERIZATION WITH CORONARY ANGIOGRAM;  Surgeon: Hillary Bow, MD;  Location: [facility] CATH LAB;  Service: Cardiovascular;  Laterality: Bilateral;   LEFT HEART CATH AND CORONARY ANGIOGRAPHY N/A 10/15/2019   Procedure: LEFT HEART CATH AND CORONARY ANGIOGRAPHY;  Surgeon: Jettie Booze, MD;  Location: [place] CV LAB;  Service: Cardiovascular;  Laterality: N/A;   MAZE  03/15/2011   Procedure: MAZE;  Surgeon: Gaye Pollack, MD;  Location: [place];  Service: Open Heart Surgery;  Laterality: N/A;   PACEMAKER INSERTION  1991   Guidant PPM, most recent Generator Change by Dr Olevia Perches was 08/22/06   RIGHT/LEFT HEART CATH AND CORONARY ANGIOGRAPHY N/A 07/06/2016   Procedure: Right/Left Heart Cath and Coronary Angiography;  Surgeon: Sherren Mocha, MD;  Location: [place] CV LAB;  Service: Cardiovascular;  Laterality: N/A;   TEE WITHOUT CARDIOVERSION  04/15/2011   Procedure: TRANSESOPHAGEAL ECHOCARDIOGRAM (TEE);  Surgeon: Loralie Champagne, MD;  Location: [place];  Service: Cardiovascular;  Laterality: N/A;   TEE WITHOUT CARDIOVERSION N/A 09/11/2014   Procedure: TRANSESOPHAGEAL ECHOCARDIOGRAM (TEE);  Surgeon: Sueanne Margarita, MD;  Location: [facility] ENDOSCOPY;  Service: Cardiovascular;  Laterality: N/A;    Current Medications: Current Outpatient Medications on File  Prior to Visit  Medication Sig   amLODipine (NORVASC) 5 MG tablet Take 1 tablet (5 mg total) by mouth at bedtime as needed.   bisacodyl (DULCOLAX) 5 MG EC tablet Take 5 mg by mouth as needed (constipation).    butalbital-acetaminophen-caffeine (FIORICET, ESGIC) 50-325-40 MG per tablet Take 1 tablet by mouth 2 (two) times daily as needed for headache (osteo-arthritis pain).    Cholecalciferol (VITAMIN D3) 2000 units capsule Take 1,000 Units by mouth daily.    Coenzyme Q10 100 MG capsule Take 100 mg by mouth daily.    diclofenac Sodium (VOLTAREN) 1 % GEL Apply 2 g topically 2 (two) times daily as needed (knee pain).   doxazosin (CARDURA) 4 MG tablet Take 4 mg by mouth at bedtime.    ELIQUIS 5 MG TABS tablet TAKE 1 TABLET BY MOUTH 2 TIMES DAILY.   ezetimibe (ZETIA) 10 MG tablet Take 1 tablet (10 mg total) by mouth daily.   HYDROcodone-acetaminophen (NORCO/VICODIN) 5-325 MG tablet TAKE 1 TABLET BY MOUTH 3 TIMES DAILY AS NEEDED FOR MODERATE PAIN   hydrocortisone sodium succinate (SOLU-CORTEF) 100 MG SOLR injection Inject 100  mL in the muscle as needed when you cannot take Prednisone by mouth   LORazepam (ATIVAN) 1 MG tablet TAKE 1 TABLET BY MOUTH AT BEDTIME. MAY ALSO TAKE 1/2 OF A TABLET DAILY AS NEEDED FOR ANXIETY.   magnesium oxide (MAG-OX) 400 MG tablet Take 400 mg by mouth daily.   metoprolol succinate (TOPROL-XL) 100 MG 24 hr tablet Take 1 tablet (100 mg total) by mouth 2 (two) times daily. Take with or immediately following a meal.   nitroGLYCERIN (NITROSTAT) 0.4 MG SL tablet Place 1 tablet (0.4 mg total) under the tongue every 5 (five) minutes as needed for chest pain.   OVER THE COUNTER MEDICATION Apply 1 patch topically daily as needed (neck and shoulder pain). Chinese pain patch   pantoprazole (PROTONIX) 40 MG tablet Take 1 tablet (40 mg total) by mouth daily.   polyethylene glycol (MIRALAX / GLYCOLAX) 17 g packet Take 17 g by mouth daily as needed for mild constipation.    potassium  chloride SA (KLOR-CON) 20 MEQ tablet Take 1 tablet (20 mEq total) by mouth daily.   predniSONE (DELTASONE) 5 MG tablet Take 5 mg by mouth daily with breakfast.   rosuvastatin (CRESTOR) 20 MG tablet Take 1 tablet (20 mg total) by mouth at bedtime.   No current facility-administered medications on file prior to visit.     Allergies:   Contrast media [iodinated diagnostic agents], Gadolinium derivatives, and Metrizamide   Social History   Tobacco Use   Smoking status: Never Smoker   Smokeless tobacco: Never Used  Vaping Use   Vaping Use: Never used  Substance Use Topics   Alcohol use: No    Alcohol/week: 0.0 standard drinks   Drug use: No    Family History: family history includes Anorexia nervosa in his daughter; Atrial fibrillation in his brother; CAD in his brother; Depression in his daughter; Heart disease in his brother; Hypertension in his son; Sarcoidosis in his brother. There is no history of Anesthesia problems, Hypotension, Malignant hyperthermia, or Pseudochol deficiency.  ROS:   Please see the history of present illness.  Additional pertinent ROS: Constitutional: Negative for chills, fever, night sweats, unintentional weight loss  HENT: Negative for ear pain and hearing loss.   Eyes: Negative for loss of vision and eye pain.  Respiratory: Negative for cough, sputum, wheezing.   Cardiovascular: See HPI. Gastrointestinal: Negative for abdominal pain, melena, and hematochezia.  Genitourinary: Negative for dysuria and hematuria.  Musculoskeletal: Negative for falls and myalgias.  Skin: Negative for itching and rash.  Neurological: Negative for focal weakness, focal sensory changes and loss of consciousness.  Endo/Heme/Allergies: Does bruise/bleed easily.     EKGs/Labs/Other Studies Reviewed:    The following studies were reviewed today: Echo 01-18-2020:   1. Left ventricular ejection fraction, by estimation, is 45 to 50%. The  left ventricle has mildly decreased  function. The left ventricle  demonstrates regional wall motion abnormalities (see scoring  diagram/findings for description). There is mild left  ventricular hypertrophy. Left ventricular diastolic function could not be  evaluated. There is severe akinesis of the left ventricular, basal-mid  inferior wall suggestive of scar.   2. Left atrial size was moderately dilated.   3. The mitral valve is abnormal. Mild mitral valve regurgitation.   4. The aortic valve has been repaired/replaced with a bioprosthetic valve  (2013). Aortic valve area, by VTI measures 1.27 cm. Aortic valve mean  gradient measures 9.0 mmHg. Aortic valve Vmax measures 2.05 m/s. DI of  0.33.  5. Aortic dilatation noted. There is mild dilatation of the ascending  aorta, measuring 41 mm.   6. Right atrial size was mildly dilated.   7. Right ventricular systolic function is low normal. The right  ventricular size is normal. There is normal pulmonary artery systolic  pressure.   8. The inferior vena cava is dilated in size with >50% respiratory  variability, suggesting right atrial pressure of 8 mmHg.   Comparison(s): No significant change from prior study. 10/15/2019: LVEF  45-50%, basal to mid inferior hypokinesis, bioprosthetic AVR - mean  gradient 7 mmHg.    Cardiac Cath 10-15-2019: Conclusion     Prox LAD to Mid LAD lesion is 40% stenosed. Prox RCA lesion is 30% stenosed. Mid RCA lesion is 40% stenosed. Previously placed Mid Cx to Dist Cx stent (unknown type) is widely patent. Dist RCA lesion is 75% stenosed. This was new compared to prior cath in 2018. A drug-eluting stent was successfully placed using a SYNERGY XD 2.75X16. THis was dilated to 3.0 mm and postdilated to 3.4 mm. Post intervention, there is a 0% residual stenosis. There is moderate left ventricular systolic dysfunction. LV end diastolic pressure is normal. The left ventricular ejection fraction is 35-45% by visual estimate. Global hypokinesis.    No aspirin given Eliquis and Plavix.  Could use Plavix longer if no bleeding issues, but bare minimum would be one month.     Continue aggressive secondary prevention and management of his tachycardia.    Diagnostic Dominance: Right   Left Main  Vessel is angiographically normal.  Left Anterior Descending  Prox LAD to Mid LAD lesion is 40% stenosed. There is mild diffuse proximal LAD stenosis unchanged from previous study. The LAD wraps around the LV apex  Left Circumflex  Previously placed Mid Cx to Dist Cx stent (unknown type) is widely patent.  Right Coronary Artery  There is mild diffuse disease throughout the vessel. Large, dominant vessel. There are diffuse irregularities with mild stenoses in the proximal and mid vessel. The distal vessel is widely patent. The PDA and PLA branches are patent.  Prox RCA lesion is 30% stenosed. The lesion is calcified.  Mid RCA lesion is 40% stenosed. The lesion is calcified.  Dist RCA lesion is 75% stenosed.    Intervention     Dist RCA lesion  Stent  CATH LAUNCHER 6FR JR4 guide catheter was inserted. Lesion crossed with guidewire using a WIRE ASAHI PROWATER 180CM. Pre-stent angioplasty was performed using a BALLOON SAPPHIRE 2.5X12. A drug-eluting stent was successfully placed using a SYNERGY XD 2.75X16. Stent strut is well apposed. Post-stent angioplasty was performed using a BALLOON SAPPHIRE [place] 3.25X12.  Post-Intervention Lesion Assessment  The intervention was successful. Pre-interventional TIMI flow is 3. Post-intervention TIMI flow is 3. No complications occurred at this lesion.  There is a 0% residual stenosis post intervention.     EKG:  EKG is personally reviewed.  The ekg ordered today demonstrates atrial tachycardia with a ventricular rate of 70 bpm  Recent Labs: 10/14/2019: TSH 2.231 10/15/2019: Magnesium 2.2 01/11/2020: ALT 20; BUN 11; Creatinine 1.00; Potassium 4.2; Sodium 141 03/19/2020: Hemoglobin 11.2; Platelet Count 203   Recent Lipid Panel    Component Value Date/Time   CHOL 134 10/16/2019 0355   CHOL 158 09/02/2017 1006   TRIG 55 10/16/2019 0355   HDL 78 10/16/2019 0355   HDL 72 09/02/2017 1006   CHOLHDL 1.7 10/16/2019 0355   VLDL 11 10/16/2019 0355   LDLCALC 45 10/16/2019 0355  LDLCALC 68 09/02/2017 1006   LDLDIRECT 117.9 11/16/2010 1355    Physical Exam:    VS:  BP (!) 115/92   Pulse 60   Ht 6\' 1"  (1.854 m)   Wt 225 lb (102.1 kg)   SpO2 99%   BMI 29.69 kg/m     Wt Readings from Last 3 Encounters:  05/16/20 225 lb (102.1 kg)  03/20/20 222 lb 8 oz (100.9 kg)  03/19/20 224 lb 8 oz (101.8 kg)    GEN: Well nourished, well developed in no acute distress HEENT: Normal, moist mucous membranes NECK: No JVD CARDIAC: regular rhythm, normal S1 and S2, no rubs or gallops. No murmur. VASCULAR: Radial and DP pulses 2+ bilaterally. No carotid bruits RESPIRATORY:  Clear to auscultation without rales, wheezing or rhonchi  ABDOMEN: Soft, non-tender, non-distended MUSCULOSKELETAL:  Ambulates independently SKIN: Warm and dry, no edema NEUROLOGIC:  Alert and oriented x 3. No focal neuro deficits noted. PSYCHIATRIC:  Normal affect    ASSESSMENT:    1. Chronic fatigue   2. Atrial tachycardia ([place])   3. Coronary artery disease of native artery of native heart with stable angina pectoris ([place])   4. History of CVA (cerebrovascular accident)   5. S/P AVR (aortic valve replacement)   6. Pure hypercholesterolemia    PLAN:    Fatigue, chronic: -unclear etiology. Symptoms did not improve after PCI in 2021.  -reviewed labs. Last LFTs nl, BMET stable. Has chronic stable anemia, last labs within his normal range. Follows with Dr. Ammie Dalton in heme/onc and Dr. Estanislado Pandy in rheumatology -he appears euvolemic on exam today -my main concerns would be the atrial tachycardia, which has been resistant to management, vs. CAD. However, with his recent cath and lack of improvements in his symptoms post PCI, CAD seems  less likely  Low normal EF (45-50% with akinetic basal-mid inferior wall 12/2019), suspect ischemic cardiomyopathy (given focal WMA) vs arrythmia mediated -similar to echo from 10/15/19, but echo 02/13/2019 read as 55-60% -cath 10/15/19 reported visual EF as 35-45% with global hypokinesis -has order in for repeat echo -continue metoprolol succinate 100 mg BiD -no BP room for ARB today. History of orthostatic hypotension, need to be cautious about antihypertensive agents  Atrial tachycardia: history of atrial fib, atrial flutter, and atrial tachycardia S/P Dual Chamber PPM -has had extensive evaluation at [facility], [facility], [facility], [facility] etc without complete resolution -follows closely with Dr. Lovena Le. I will reach out to him and discuss options  History of CVA: -continue apixaban, rosuvastatin  Bioprosthetic AVR: -he discontinue aspirin, is on apixaban alone -echo reviewed as above  CAD with stable angina: -has not tolerated imdur for angina -on maximum dose of metoprolol succinate -reviewed cath from 09/2019. Had PCI to RCA, did not improve fatigue -did not have improvement on ranolazine in the past  Hypercholesterolemia: -continue rosuvastatin, ezetimibe -Last LDL 45, goal <70. Did discuss data that there is benefit to lowering LDL as low as medication permits. He is not having known medication side effects at this time.  Cardiac risk counseling and prevention recommendations: -recommend heart healthy/Mediterranean diet, with whole grains, fruits, vegetable, fish, lean meats, nuts, and olive oil. Limit salt. -recommend moderate walking, 3-5 times/week for 30-50 minutes each session. Aim for at least 150 minutes.week. Goal should be pace of 3 miles/hours, or walking 1.5 miles in 30 minutes -recommend avoidance of tobacco products. Avoid excess alcohol.  Plan for follow up: 4-6 weeks, will contact me if symptoms worsen  Buford Dresser, MD, PhD, [facility]  [facility]     Medication Adjustments/Labs and Tests Ordered: Current medicines are reviewed at length with the patient today.  Concerns regarding medicines are outlined above.  Orders Placed This Encounter  Procedures   EKG 12-Lead   No orders of the defined types were placed in this encounter.   Patient Instructions  Medication Instructions:  Your Physician recommend you continue on your current medication as directed.    *If you need a refill on your cardiac medications before your next appointment, please call your pharmacy*   Lab Work: None   Testing/Procedures: None   Follow-Up: At [facility], you and your health needs are our priority.  As part of our continuing mission to provide you with exceptional heart care, we have created designated Provider Care Teams.  These Care Teams include your primary Cardiologist (physician) and Advanced Practice Providers (APPs -  Physician Assistants and Nurse Practitioners) who all work together to provide you with the care you need, when you need it.  We recommend signing up for the patient portal called "MyChart".  Sign up information is provided on this After Visit Summary.  MyChart is used to connect with patients for Virtual Visits (Telemedicine).  Patients are able to view lab/test results, encounter notes, upcoming appointments, etc.  Non-urgent messages can be sent to your provider as well.   To learn more about what you can do with MyChart, go to [email or web address].    Your next appointment:   4-6 week(s)  The format for your next appointment:   In Person  Provider:   Buford Dresser, MD    Signed, Buford Dresser, MD PhD 05/16/2020     [place]

## 2020-05-16 ENCOUNTER — Encounter: Payer: Self-pay | Admitting: Cardiology

## 2020-05-16 ENCOUNTER — Ambulatory Visit (INDEPENDENT_AMBULATORY_CARE_PROVIDER_SITE_OTHER): Payer: Medicare Other | Admitting: Cardiology

## 2020-05-16 ENCOUNTER — Other Ambulatory Visit: Payer: Self-pay

## 2020-05-16 VITALS — BP 115/92 | HR 60 | Ht 73.0 in | Wt 225.0 lb

## 2020-05-16 DIAGNOSIS — I471 Supraventricular tachycardia: Secondary | ICD-10-CM | POA: Diagnosis not present

## 2020-05-16 DIAGNOSIS — I25119 Atherosclerotic heart disease of native coronary artery with unspecified angina pectoris: Secondary | ICD-10-CM | POA: Diagnosis not present

## 2020-05-16 DIAGNOSIS — I25118 Atherosclerotic heart disease of native coronary artery with other forms of angina pectoris: Secondary | ICD-10-CM

## 2020-05-16 DIAGNOSIS — Z8673 Personal history of transient ischemic attack (TIA), and cerebral infarction without residual deficits: Secondary | ICD-10-CM | POA: Diagnosis not present

## 2020-05-16 DIAGNOSIS — E7800 Pure hypercholesterolemia, unspecified: Secondary | ICD-10-CM

## 2020-05-16 DIAGNOSIS — I4719 Other supraventricular tachycardia: Secondary | ICD-10-CM

## 2020-05-16 DIAGNOSIS — R5382 Chronic fatigue, unspecified: Secondary | ICD-10-CM | POA: Diagnosis not present

## 2020-05-16 DIAGNOSIS — Z952 Presence of prosthetic heart valve: Secondary | ICD-10-CM

## 2020-05-16 DIAGNOSIS — I4892 Unspecified atrial flutter: Secondary | ICD-10-CM

## 2020-05-16 NOTE — Patient Instructions (Signed)
Medication Instructions:  Your Physician recommend you continue on your current medication as directed.    *If you need a refill on your cardiac medications before your next appointment, please call your pharmacy*   Lab Work: None   Testing/Procedures: None   Follow-Up: At [facility], you and your health needs are our priority.  As part of our continuing mission to provide you with exceptional heart care, we have created designated Provider Care Teams.  These Care Teams include your primary Cardiologist (physician) and Advanced Practice Providers (APPs -  Physician Assistants and Nurse Practitioners) who all work together to provide you with the care you need, when you need it.  We recommend signing up for the patient portal called "MyChart".  Sign up information is provided on this After Visit Summary.  MyChart is used to connect with patients for Virtual Visits (Telemedicine).  Patients are able to view lab/test results, encounter notes, upcoming appointments, etc.  Non-urgent messages can be sent to your provider as well.   To learn more about what you can do with MyChart, go to [email or web address].    Your next appointment:   4-6 week(s)  The format for your next appointment:   In Person  Provider:   Buford Dresser, MD

## 2020-05-19 NOTE — Progress Notes (Signed)
Remote pacemaker transmission.   

## 2020-05-22 ENCOUNTER — Other Ambulatory Visit: Payer: Self-pay | Admitting: Internal Medicine

## 2020-05-22 ENCOUNTER — Other Ambulatory Visit: Payer: Self-pay | Admitting: Cardiology

## 2020-05-22 MED FILL — DOXAZOSIN MESYLATE 4 MG TAB: 4 | 90 days supply | Qty: 90 | Fill #1

## 2020-05-22 MED FILL — METOPROLOL SUCCINATE ER 100: 100 | 30 days supply | Qty: 60 | Fill #0

## 2020-05-22 MED FILL — ROSUVASTATIN CALCIUM 20 MG: 20 | 90 days supply | Qty: 90 | Fill #1

## 2020-05-22 MED FILL — PANTOPRAZOLE SOD DR 40 MG T: 40 | 90 days supply | Qty: 90 | Fill #2

## 2020-05-22 MED FILL — EZETIMIBE 10 MG TABS: 10 | 90 days supply | Qty: 90 | Fill #2

## 2020-05-24 NOTE — Progress Notes (Deleted)
Office Visit Note  Patient: Kevin Buff, [name]             Date of Birth: 21-Jun-1934           MRN: [id number]             PCP: Gayland Curry, DO Referring: Al[place], Legrand Como, [name] Visit Date: 06/04/2020 Occupation: @GUAROCC @  Subjective:  No chief complaint on file.   History of Present Illness: Kevin Buff, [name] is a 86 y.o. male ***   Activities of Daily Living:  Patient reports morning stiffness for *** {minute/hour:19697}.   Patient {ACTIONS;DENIES/REPORTS:21021675::"Denies"} nocturnal pain.  Difficulty dressing/grooming: {ACTIONS;DENIES/REPORTS:21021675::"Denies"} Difficulty climbing stairs: {ACTIONS;DENIES/REPORTS:21021675::"Denies"} Difficulty getting out of chair: {ACTIONS;DENIES/REPORTS:21021675::"Denies"} Difficulty using hands for taps, buttons, cutlery, and/or writing: {ACTIONS;DENIES/REPORTS:21021675::"Denies"}  No Rheumatology ROS completed.   PMFS History:  Patient Active Problem List   Diagnosis Date Noted  . 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[place]) 02/27/2020  . Chronic HFrEF (heart failure with reduced ejection fraction) ([place]) 02/27/2020  . 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 Urinary retention with incomplete bladder emptying 12/05/2019  . Physical deconditioning 12/05/2019  . Other hemorrhoids 12/05/2019  . Adjustment reaction with anxiety 12/05/2019  . A-fib ([place]) 10/14/2019  . Atrial fibrillation with rapid ventricular response ([place]) 10/13/2019  . NSTEMI (non-ST elevated myocardial infarction) ([place]) 10/13/2019  . Acute diarrhea 10/13/2019  . Normocytic anemia 10/13/2019  . Diverticulitis sigmoid colon recurrent   . Prediabetes 09/21/2017  . Persistent atrial fibrillation ([place]) 04/11/2017  . Vitamin D deficiency 05/20/2016  . History of pacemaker 05/20/2016  . History of stroke/[place]  05/20/2016  . Primary osteoarthritis of both hands 03/10/2016  . DDD cervical spine 03/10/2016  . Osteoarthritis of lumbar  spine 03/10/2016  . Chronic left shoulder pain 03/10/2016  . Trochanteric bursitis of right hip 03/10/2016  . Other fatigue 03/10/2016  . Claudication in peripheral vascular disease ([place]) 03/10/2016  . Chronic pain syndrome 03/10/2016  . Ocular myasthenia gravis ([place]) 10/28/2015  . Typical atrial flutter ([place])   . PVC's (premature ventricular contractions) 01/30/2015  . Pacemaker 04/30/2013  . IgM monoclonal gammopathy of uncertain significance 02/17/2013  . Orthostatic hypotension 04/11/2011  . Long term current use of anticoagulant therapy 03/24/2011  . S/P AVR 03/24/2011  . Pleural effusion 03/24/2011  . Hypothyroidism 11/22/2010  . Atrial flutter ([place]) 08/19/2010  . Syndrome X (cardiac) ([place]) 04/06/2010  . Mixed hyperlipidemia 12/08/2007  . PRIMARY HYPERCOAGULABLE STATE 12/08/2007  . Coronary artery disease with exertional angina ([place]) 12/08/2007  . Coronary artery disease involving native coronary artery of native heart 12/08/2007  . AORTIC STENOSIS/ INSUFFICIENCY, NON-RHEUMATIC 12/08/2007  . Atrial fibrillation ([place]) 12/08/2007  . Chronotropic incompetence with sinus node dysfunction (HCC) 12/08/2007    Past Medical History:  Diagnosis Date  . Aortic stenosis    moderate aortic stenosis  . Arthritis   . Benign prostatic hypertrophy   . Chronic systolic heart failure ([place])   . 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  . Coronary artery disease    status post multiple prior percutaneous coronary interventions, microvascular angina per Dr Olevia Perches  . Diverticulitis sigmoid colon recurrent   . Dysfunctional autonomic nervous system   . Dyspnea   . Heart murmur   . History of primary hypertension   . Hypercoagulable state ([place])    chronically anticoagulated with coumadin  . Hyperlipidemia   .  Hyperthyroidism   .  Hypothyroidism    Dr. Elyse Hsu  . MGUS (monoclonal gammopathy of unknown significance) 02/17/2013  . Ocular myasthenia ([place])   . Osteoarthritis   . Paroxysmal atrial fibrillation ([place])    DR. Lia Foyer,   . Prediabetes 09/21/2017  . Stroke [facility])    1990    Family History  Problem Relation Age of Onset  . Heart disease Brother        Twin brother has coronary disease and recent AVR for AS  . CAD Brother   . Atrial fibrillation Brother   . Sarcoidosis Brother   . Depression Daughter   . Anorexia nervosa Daughter   . Hypertension Son   . Anesthesia problems Neg Hx   . Hypotension Neg Hx   . Malignant hyperthermia Neg Hx   . Pseudochol deficiency Neg Hx    Past Surgical History:  Procedure Laterality Date  . AORTIC VALVE REPLACEMENT  03/15/2011   Procedure: AORTIC VALVE REPLACEMENT (AVR);  Surgeon: Gaye Pollack, [name];  Location: [place];  Service: Open Heart Surgery;  Laterality: N/A;  . APPENDECTOMY    . CARDIAC CATHETERIZATION     11  . CARDIOVERSION    . CARDIOVERSION  04/15/2011   Procedure: CARDIOVERSION;  Surgeon: Loralie Champagne, [name];  Location: [place];  Service: Cardiovascular;  Laterality: N/A;  . CARDIOVERSION N/A 09/11/2014   Procedure: CARDIOVERSION;  Surgeon: Sueanne Margarita, [name];  Location: [facility] ENDOSCOPY;  Service: Cardiovascular;  Laterality: N/A;  . CARDIOVERSION N/A 06/27/2015   Procedure: CARDIOVERSION;  Surgeon: Thayer Headings, [name];  Location: [facility] ENDOSCOPY;  Service: Cardiovascular;  Laterality: N/A;  . CARDIOVERSION N/A 07/04/2015   Procedure: CARDIOVERSION;  Surgeon: Evans Lance, [name];  Location: [place];  Service: Cardiovascular;  Laterality: N/A;  . CARDIOVERSION N/A 04/13/2017   Procedure: CARDIOVERSION;  Surgeon: Sueanne Margarita, [name];  Location: [facility] ENDOSCOPY;  Service: Cardiovascular;  Laterality: N/A;  . COLONOSCOPY    . CORONARY STENT INTERVENTION N/A 10/15/2019   Procedure: CORONARY STENT INTERVENTION;  Surgeon: Jettie Booze, [name];  Location: [place] CV LAB;  Service: Cardiovascular;  Laterality: N/A;  . EP IMPLANTABLE DEVICE N/A 06/16/2015   Procedure: Pacemaker Implant;  Surgeon: Evans Lance, [name];  Location: [place] CV LAB;  Service: Cardiovascular;  Laterality: N/A;  . hemrrhoidectomy    . LEFT AND RIGHT HEART CATHETERIZATION WITH CORONARY ANGIOGRAM Bilateral 02/01/2011   Procedure: LEFT AND RIGHT HEART CATHETERIZATION WITH CORONARY ANGIOGRAM;  Surgeon: Hillary Bow, [name];  Location: [facility] CATH LAB;  Service: Cardiovascular;  Laterality: Bilateral;  . LEFT HEART CATH AND CORONARY ANGIOGRAPHY N/A 10/15/2019   Procedure: LEFT HEART CATH AND CORONARY ANGIOGRAPHY;  Surgeon: Jettie Booze, [name];  Location: [place] CV LAB;  Service: Cardiovascular;  Laterality: N/A;  . MAZE  03/15/2011   Procedure: MAZE;  Surgeon: Gaye Pollack, [name];  Location: [place];  Service: Open Heart Surgery;  Laterality: N/A;  . PACEMAKER INSERTION  1991   Guidant PPM, most recent Generator Change by Dr Olevia Perches was 08/22/06  . RIGHT/LEFT HEART CATH AND CORONARY ANGIOGRAPHY N/A 07/06/2016   Procedure: Right/Left Heart Cath and Coronary Angiography;  Surgeon: Sherren Mocha, [name];  Location: [place] CV LAB;  Service: Cardiovascular;  Laterality: N/A;  . TEE WITHOUT CARDIOVERSION  04/15/2011   Procedure: TRANSESOPHAGEAL ECHOCARDIOGRAM (TEE);  Surgeon: Loralie Champagne, [name];  Location: [place];  Service: Cardiovascular;  Laterality: N/A;  . TEE WITHOUT CARDIOVERSION N/A 09/11/2014   Procedure: TRANSESOPHAGEAL  ECHOCARDIOGRAM (TEE);  Surgeon: Sueanne Margarita, [name];  Location: [facility] ENDOSCOPY;  Service: Cardiovascular;  Laterality: N/A;   Social History   Social History Narrative   Married to [place]. Has grown children   Retired Horticulturist, commercial [name]      Never smoker no alcohol      Immunization History  Administered Date(s) Administered  . Influenza Split 12/30/2017  . Influenza, High Dose Seasonal PF 12/28/2019  . PFIZER(Purple Top)SARS-COV-2 Vaccination  03/13/2019, 04/02/2019, 11/01/2019     Objective: Vital Signs: There were no vitals taken for this visit.   Physical Exam   Musculoskeletal Exam: ***  CDAI Exam: CDAI Score: - Patient Global: -; Provider Global: - Swollen: -; Tender: - Joint Exam 06/04/2020   No joint exam has been documented for this visit   There is currently no information documented on the homunculus. Go to the Rheumatology activity and complete the homunculus joint exam.  Investigation: No additional findings.  Imaging: CUP PACEART REMOTE DEVICE CHECK  Result Date: 05/12/2020 Scheduled remote reviewed. Normal device function.  Known persistent AF and on apixaban - programmed DDIR NST events suggest RVR; overall V rates controlled Next remote 91 days. HB   Recent Labs: Lab Results  Component Value Date   WBC 6.7 03/19/2020   HGB 11.2 (L) 03/19/2020   PLT 203 03/19/2020   NA 141 01/11/2020   K 4.2 01/11/2020   CL 106 01/11/2020   CO2 29 01/11/2020   GLUCOSE 114 (H) 01/11/2020   BUN 11 01/11/2020   CREATININE 1.00 01/11/2020   BILITOT 0.6 01/11/2020   ALKPHOS 101 01/11/2020   AST 26 01/11/2020   ALT 20 01/11/2020   PROT 7.3 01/11/2020   ALBUMIN 3.9 01/11/2020   CALCIUM 9.3 01/11/2020   GFRAA 92 11/26/2019   March 19, 2020 UDS consistent with use of benzodiazepines and hydrocodone  Speciality Comments: No specialty comments available.  Procedures:  No procedures performed Allergies: Contrast media [iodinated diagnostic agents], Gadolinium derivatives, and Metrizamide   Assessment / Plan:     Visit Diagnoses: Primary osteoarthritis of both shoulders  Primary osteoarthritis of both hands  Trochanteric bursitis, right hip  DDD (degenerative disc disease), cervical  DDD (degenerative disc disease), lumbar  Medication monitoring encounter  Other chronic pain  Myofascial pain  Other fatigue  Paroxysmal atrial fibrillation (HCC)  History of pacemaker  S/P AVR  Coronary  artery disease with exertional angina (HCC)  Primary hypercoagulable state ([place])  History of stroke  History of diverticulitis  MGUS (monoclonal gammopathy of unknown significance)  Adrenal insufficiency ([place])  Other depression  History of vitamin D deficiency  Orders: No orders of the defined types were placed in this encounter.  No orders of the defined types were placed in this encounter.   Face-to-face time spent with patient was *** minutes. Greater than 50% of time was spent in counseling and coordination of care.  Follow-Up Instructions: No follow-ups on file.   Bo Merino, [name]  Note - This record has been created using Editor, commissioning.  Chart creation errors have been sought, but may not always  have been located. Such creation errors do not reflect on  the standard of medical care.

## 2020-05-26 DIAGNOSIS — M7062 Trochanteric bursitis, left hip: Secondary | ICD-10-CM | POA: Diagnosis not present

## 2020-05-26 DIAGNOSIS — M25511 Pain in right shoulder: Secondary | ICD-10-CM | POA: Diagnosis not present

## 2020-05-26 DIAGNOSIS — M25512 Pain in left shoulder: Secondary | ICD-10-CM | POA: Diagnosis not present

## 2020-05-26 DIAGNOSIS — M19011 Primary osteoarthritis, right shoulder: Secondary | ICD-10-CM | POA: Diagnosis not present

## 2020-05-26 DIAGNOSIS — M19012 Primary osteoarthritis, left shoulder: Secondary | ICD-10-CM | POA: Diagnosis not present

## 2020-05-26 DIAGNOSIS — M25611 Stiffness of right shoulder, not elsewhere classified: Secondary | ICD-10-CM | POA: Diagnosis not present

## 2020-05-26 DIAGNOSIS — M25612 Stiffness of left shoulder, not elsewhere classified: Secondary | ICD-10-CM | POA: Diagnosis not present

## 2020-05-26 DIAGNOSIS — M47812 Spondylosis without myelopathy or radiculopathy, cervical region: Secondary | ICD-10-CM | POA: Diagnosis not present

## 2020-05-26 DIAGNOSIS — M25652 Stiffness of left hip, not elsewhere classified: Secondary | ICD-10-CM | POA: Diagnosis not present

## 2020-05-26 DIAGNOSIS — M25651 Stiffness of right hip, not elsewhere classified: Secondary | ICD-10-CM | POA: Diagnosis not present

## 2020-05-26 DIAGNOSIS — M25551 Pain in right hip: Secondary | ICD-10-CM | POA: Diagnosis not present

## 2020-05-26 DIAGNOSIS — M7061 Trochanteric bursitis, right hip: Secondary | ICD-10-CM | POA: Diagnosis not present

## 2020-05-30 ENCOUNTER — Other Ambulatory Visit: Payer: Self-pay | Admitting: Physician Assistant

## 2020-05-30 ENCOUNTER — Other Ambulatory Visit: Payer: Self-pay | Admitting: Rheumatology

## 2020-05-30 NOTE — Telephone Encounter (Signed)
Last Visit: 03/19/2020 Next Visit: 06/04/2020 UDS: 03/19/2020, UDS is consistent with the treatment. Narc Agreement:  07/24/2019  Last Fill:05/02/2020,  Other chronic pain, hydrocodone to 5 -325 mg 3 times daily as needed  Okay to refill Hydrocodone?

## 2020-05-31 ENCOUNTER — Other Ambulatory Visit (HOSPITAL_COMMUNITY): Payer: Self-pay

## 2020-05-31 MED FILL — Hydrocodone-Acetaminophen Tab 5-325 MG: ORAL | 30 days supply | Qty: 90 | Fill #0 | Status: AC

## 2020-06-01 ENCOUNTER — Other Ambulatory Visit (HOSPITAL_COMMUNITY): Payer: Self-pay

## 2020-06-02 ENCOUNTER — Other Ambulatory Visit (HOSPITAL_COMMUNITY): Payer: Self-pay

## 2020-06-04 ENCOUNTER — Telehealth: Payer: Self-pay | Admitting: *Deleted

## 2020-06-04 ENCOUNTER — Ambulatory Visit: Payer: Medicare Other | Admitting: Rheumatology

## 2020-06-04 DIAGNOSIS — Z8673 Personal history of transient ischemic attack (TIA), and cerebral infarction without residual deficits: Secondary | ICD-10-CM

## 2020-06-04 DIAGNOSIS — M19041 Primary osteoarthritis, right hand: Secondary | ICD-10-CM

## 2020-06-04 DIAGNOSIS — D6859 Other primary thrombophilia: Secondary | ICD-10-CM

## 2020-06-04 DIAGNOSIS — M7918 Myalgia, other site: Secondary | ICD-10-CM

## 2020-06-04 DIAGNOSIS — Z950 Presence of cardiac pacemaker: Secondary | ICD-10-CM

## 2020-06-04 DIAGNOSIS — Z8639 Personal history of other endocrine, nutritional and metabolic disease: Secondary | ICD-10-CM

## 2020-06-04 DIAGNOSIS — Z8719 Personal history of other diseases of the digestive system: Secondary | ICD-10-CM

## 2020-06-04 DIAGNOSIS — I480 Paroxysmal atrial fibrillation: Secondary | ICD-10-CM

## 2020-06-04 DIAGNOSIS — I25118 Atherosclerotic heart disease of native coronary artery with other forms of angina pectoris: Secondary | ICD-10-CM

## 2020-06-04 DIAGNOSIS — Z5181 Encounter for therapeutic drug level monitoring: Secondary | ICD-10-CM

## 2020-06-04 DIAGNOSIS — E2740 Unspecified adrenocortical insufficiency: Secondary | ICD-10-CM

## 2020-06-04 DIAGNOSIS — Z952 Presence of prosthetic heart valve: Secondary | ICD-10-CM

## 2020-06-04 DIAGNOSIS — G8929 Other chronic pain: Secondary | ICD-10-CM

## 2020-06-04 DIAGNOSIS — D472 Monoclonal gammopathy: Secondary | ICD-10-CM

## 2020-06-04 DIAGNOSIS — M7061 Trochanteric bursitis, right hip: Secondary | ICD-10-CM

## 2020-06-04 DIAGNOSIS — M19011 Primary osteoarthritis, right shoulder: Secondary | ICD-10-CM

## 2020-06-04 DIAGNOSIS — M5030 Other cervical disc degeneration, unspecified cervical region: Secondary | ICD-10-CM

## 2020-06-04 DIAGNOSIS — R5383 Other fatigue: Secondary | ICD-10-CM

## 2020-06-04 DIAGNOSIS — M5136 Other intervertebral disc degeneration, lumbar region: Secondary | ICD-10-CM

## 2020-06-04 DIAGNOSIS — F3289 Other specified depressive episodes: Secondary | ICD-10-CM

## 2020-06-04 NOTE — Telephone Encounter (Signed)
I called patient regarding use of Hydrocodone and Lorazepam puts patient at great risk of potentially fatal opioid overdose, patient verbalized understanding.

## 2020-06-05 ENCOUNTER — Other Ambulatory Visit (HOSPITAL_COMMUNITY): Payer: Self-pay

## 2020-06-06 ENCOUNTER — Ambulatory Visit (INDEPENDENT_AMBULATORY_CARE_PROVIDER_SITE_OTHER): Payer: Medicare Other | Admitting: Internal Medicine

## 2020-06-06 ENCOUNTER — Other Ambulatory Visit (HOSPITAL_COMMUNITY): Payer: Self-pay

## 2020-06-06 ENCOUNTER — Other Ambulatory Visit: Payer: Self-pay

## 2020-06-06 VITALS — BP 134/86 | HR 63 | Ht 73.0 in | Wt 226.0 lb

## 2020-06-06 DIAGNOSIS — I951 Orthostatic hypotension: Secondary | ICD-10-CM

## 2020-06-06 DIAGNOSIS — I25119 Atherosclerotic heart disease of native coronary artery with unspecified angina pectoris: Secondary | ICD-10-CM

## 2020-06-06 DIAGNOSIS — I495 Sick sinus syndrome: Secondary | ICD-10-CM

## 2020-06-06 DIAGNOSIS — I5022 Chronic systolic (congestive) heart failure: Secondary | ICD-10-CM

## 2020-06-06 DIAGNOSIS — I4819 Other persistent atrial fibrillation: Secondary | ICD-10-CM | POA: Diagnosis not present

## 2020-06-06 DIAGNOSIS — Z950 Presence of cardiac pacemaker: Secondary | ICD-10-CM

## 2020-06-06 MED ORDER — METOPROLOL SUCCINATE ER 100 MG PO TB24
ORAL_TABLET | ORAL | 3 refills | Status: DC
  Filled 2020-06-06: qty 135, fill #0
  Filled 2020-06-20 (×3): qty 135, 90d supply, fill #0
  Filled 2020-08-29: qty 135, 90d supply, fill #1

## 2020-06-06 MED FILL — Lorazepam Tab 1 MG: ORAL | 30 days supply | Qty: 45 | Fill #0 | Status: AC

## 2020-06-06 NOTE — Progress Notes (Signed)
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 He is a pleasant 86 yo man with a long past medical history with CAD, AS, s/p AVR, persistent left atrial flutter, HTN, and sinus node dysfunction. He underwent PCI of the RCA 8 months ago. He notes exertional angina which has responded to slntg. He denies non-compliance. He also c/o fatigue and weakness. No syncope.   Allergies  Allergen Reactions  . Contrast Media [Iodinated Diagnostic Agents] Hives  . Gadolinium Derivatives Hives  . Metrizamide Hives     Current Outpatient Medications  Medication Sig Dispense Refill  . amLODipine (NORVASC) 5 MG tablet Take 1 tablet (5 mg total) by mouth at bedtime as needed. 90 tablet 1  . amoxicillin (AMOXIL) 500 MG capsule TAKE 4 CAPSULES BY MOUTH 1 HOUR PRIOR TO DENTAL APPT 20 capsule 99  . apixaban (ELIQUIS) 5 MG TABS tablet TAKE 1 TABLET BY MOUTH 2 TIMES DAILY. 180 tablet 1  . bisacodyl (DULCOLAX) 5 MG EC tablet Take 5 mg by mouth as needed (constipation).     . butalbital-acetaminophen-caffeine (FIORICET) 50-325-40 MG tablet TAKE 1 TABLET BY MOUTH TWO TIMES DAILY AS NEEDED FOR TENSION HEADACHES AS DIRECTED 180 tablet 2  . butalbital-acetaminophen-caffeine (FIORICET, ESGIC) 50-325-40 MG per tablet Take 1 tablet by mouth 2 (two) times daily as needed for headache (osteo-arthritis pain).     . Cholecalciferol (VITAMIN D3) 2000 units capsule Take 1,000 Units by mouth daily.     . Coenzyme Q10 100 MG capsule Take 100 mg by mouth daily.     . diclofenac Sodium (VOLTAREN) 1 % GEL Apply 2 g topically 2 (two) times daily as needed (knee pain).    Marland Kitchen doxazosin (CARDURA) 4 MG tablet TAKE 1 TABLET BY MOUTH ONCE A DAY 90 tablet 2  . ezetimibe (ZETIA) 10 MG tablet TAKE 1 TABLET (10 MG TOTAL) BY MOUTH DAILY. 90 tablet 2  . HYDROcodone-acetaminophen (NORCO/VICODIN) 5-325 MG tablet TAKE 1 TABLET BY MOUTH 3 TIMES DAILY AS NEEDED FOR MODERATE PAIN 90 tablet 0  . hydrocortisone sodium succinate (SOLU-CORTEF) 100 MG  SOLR injection INJECT 100 MG INTO THE MUSCLE AS NEEDED WHEN YOU CAN'T TAKE PREDNISONE BY MOUTH 1 each 99  . LORazepam (ATIVAN) 1 MG tablet TAKE 1 TABLET BY MOUTH AT BEDTIME. MAY ALSO TAKE 1/2 TABLET DAILY AS NEEDED FOR ANXIETY. 45 tablet 5  . magnesium oxide (MAG-OX) 400 MG tablet Take 400 mg by mouth daily.    . metoprolol succinate (TOPROL-XL) 100 MG 24 hr tablet TAKE 1 TABLET BY MOUTH 2 TIMES DAILY WITH OR IMMEDIATELY FOLLOWING A MEAL 60 tablet 5  . nitroGLYCERIN (NITROSTAT) 0.4 MG SL tablet PLACE 1 TABLET UNDER THE TONGUE EVERY 5 MINUTES AS NEEDED FOR CHEST PAIN. 25 tablet 5  . OVER THE COUNTER MEDICATION Apply 1 patch topically daily as needed (neck and shoulder pain). Chinese pain patch    . pantoprazole (PROTONIX) 40 MG tablet TAKE 1 TABLET (40 MG TOTAL) BY MOUTH DAILY. 90 tablet 3  . polyethylene glycol (MIRALAX / GLYCOLAX) 17 g packet Take 17 g by mouth daily as needed for mild constipation.     . potassium chloride SA (KLOR-CON) 20 MEQ tablet TAKE 1 TABLET BY MOUTH DAILY. 90 tablet 3  . predniSONE (DELTASONE) 5 MG tablet Take 5 mg by mouth daily with breakfast.    . rosuvastatin (CRESTOR) 20 MG tablet TAKE 1 TABLET (20 MG TOTAL) BY MOUTH AT BEDTIME. 90 tablet 3   No current facility-administered  medications for this visit.     Past Medical History:  Diagnosis Date  . Aortic stenosis    moderate aortic stenosis  . Arthritis   . Benign prostatic hypertrophy   . Chronic systolic heart failure ([place])   . 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  . Coronary artery disease    status post multiple prior percutaneous coronary interventions, microvascular angina per Dr Olevia Perches  . Diverticulitis sigmoid colon recurrent   . Dysfunctional autonomic nervous system   . Dyspnea   . Heart murmur   . History of primary hypertension   .  Hypercoagulable state ([place])    chronically anticoagulated with coumadin  . Hyperlipidemia   . Hyperthyroidism   . Hypothyroidism    Dr. Elyse Hsu  . MGUS (monoclonal gammopathy of unknown significance) 02/17/2013  . Ocular myasthenia ([place])   . Osteoarthritis   . Paroxysmal atrial fibrillation ([place])    DR. Lia Foyer,   . Prediabetes 09/21/2017  . Stroke ([place])    1990    ROS:   All systems reviewed and negative except as noted in the HPI.   Past Surgical History:  Procedure Laterality Date  . AORTIC VALVE REPLACEMENT  03/15/2011   Procedure: AORTIC VALVE REPLACEMENT (AVR);  Surgeon: Gaye Pollack, MD;  Location: [place];  Service: Open Heart Surgery;  Laterality: N/A;  . APPENDECTOMY    . CARDIAC CATHETERIZATION     11  . CARDIOVERSION    . CARDIOVERSION  04/15/2011   Procedure: CARDIOVERSION;  Surgeon: Loralie Champagne, MD;  Location: [place];  Service: Cardiovascular;  Laterality: N/A;  . CARDIOVERSION N/A 09/11/2014   Procedure: CARDIOVERSION;  Surgeon: Sueanne Margarita, MD;  Location: [facility] ENDOSCOPY;  Service: Cardiovascular;  Laterality: N/A;  . CARDIOVERSION N/A 06/27/2015   Procedure: CARDIOVERSION;  Surgeon: Thayer Headings, MD;  Location: [facility] ENDOSCOPY;  Service: Cardiovascular;  Laterality: N/A;  . CARDIOVERSION N/A 07/04/2015   Procedure: CARDIOVERSION;  Surgeon: Evans Lance, MD;  Location: [place];  Service: Cardiovascular;  Laterality: N/A;  . CARDIOVERSION N/A 04/13/2017   Procedure: CARDIOVERSION;  Surgeon: Sueanne Margarita, MD;  Location: [facility] ENDOSCOPY;  Service: Cardiovascular;  Laterality: N/A;  . COLONOSCOPY    . CORONARY STENT INTERVENTION N/A 10/15/2019   Procedure: CORONARY STENT INTERVENTION;  Surgeon: Jettie Booze, MD;  Location: [place] CV LAB;  Service: Cardiovascular;  Laterality: N/A;  . EP IMPLANTABLE DEVICE N/A 06/16/2015   Procedure: Pacemaker Implant;  Surgeon: Evans Lance, MD;  Location: [place] CV LAB;  Service: Cardiovascular;   Laterality: N/A;  . hemrrhoidectomy    . LEFT AND RIGHT HEART CATHETERIZATION WITH CORONARY ANGIOGRAM Bilateral 02/01/2011   Procedure: LEFT AND RIGHT HEART CATHETERIZATION WITH CORONARY ANGIOGRAM;  Surgeon: Hillary Bow, MD;  Location: [facility] CATH LAB;  Service: Cardiovascular;  Laterality: Bilateral;  . LEFT HEART CATH AND CORONARY ANGIOGRAPHY N/A 10/15/2019   Procedure: LEFT HEART CATH AND CORONARY ANGIOGRAPHY;  Surgeon: Jettie Booze, MD;  Location: [place] CV LAB;  Service: Cardiovascular;  Laterality: N/A;  . MAZE  03/15/2011   Procedure: MAZE;  Surgeon: Gaye Pollack, MD;  Location: [place];  Service: Open Heart Surgery;  Laterality: N/A;  . PACEMAKER INSERTION  1991   Guidant PPM, most recent Generator Change by Dr Olevia Perches was 08/22/06  . RIGHT/LEFT HEART CATH AND CORONARY ANGIOGRAPHY N/A  07/06/2016   Procedure: Right/Left Heart Cath and Coronary Angiography;  Surgeon: Sherren Mocha, MD;  Location: [place] CV LAB;  Service: Cardiovascular;  Laterality: N/A;  . TEE WITHOUT CARDIOVERSION  04/15/2011   Procedure: TRANSESOPHAGEAL ECHOCARDIOGRAM (TEE);  Surgeon: Loralie Champagne, MD;  Location: [place];  Service: Cardiovascular;  Laterality: N/A;  . TEE WITHOUT CARDIOVERSION N/A 09/11/2014   Procedure: TRANSESOPHAGEAL ECHOCARDIOGRAM (TEE);  Surgeon: Sueanne Margarita, MD;  Location: [facility] ENDOSCOPY;  Service: Cardiovascular;  Laterality: N/A;     Family History  Problem Relation Age of Onset  . Heart disease Brother        Twin brother has coronary disease and recent AVR for AS  . CAD Brother   . Atrial fibrillation Brother   . Sarcoidosis Brother   . Depression Daughter   . Anorexia nervosa Daughter   . Hypertension Son   . Anesthesia problems Neg Hx   . Hypotension Neg Hx   . Malignant hyperthermia Neg Hx   . Pseudochol deficiency Neg Hx      Social History   Socioeconomic History  . Marital status: Married    Spouse name: Baker Janus   . Number of children: 3  . Years of  education: Not on file  . Highest education level: Not on file  Occupational History  . Occupation: Retired    Comment: Physician  Tobacco Use  . Smoking status: Never Smoker  . Smokeless tobacco: Never Used  Vaping Use  . Vaping Use: Never used  Substance and Sexual Activity  . Alcohol use: No    Alcohol/week: 0.0 standard drinks  . Drug use: No  . Sexual activity: Not Currently  Other Topics Concern  . Not on file  Social History Narrative   Married to [place]. Has grown children   Retired Horticulturist, commercial MD      Never smoker no alcohol      Social Determinants of Radio broadcast assistant Strain: Not on file  Food Insecurity: Not on file  Transportation Needs: Not on file  Physical Activity: Not on file  Stress: Not on file  Social Connections: Not on file  Intimate Partner Violence: Not on file     BP 134/86   Pulse 63   Ht 6\' 1"  (1.854 m)   Wt 226 lb (102.5 kg)   SpO2 96%   BMI 29.82 kg/m   Physical Exam:  Well appearing NAD HEENT: Unremarkable Neck:  No JVD, no thyromegally Lymphatics:  No adenopathy Back:  No CVA tenderness Lungs:  Clear with no wheezes HEART:  Regular rate rhythm, no murmurs, no rubs, no clicks Abd:  soft, positive bowel sounds, no organomegally, no rebound, no guarding Ext:  2 plus pulses, no edema, no cyanosis, no clubbing Skin:  No rashes no nodules Neuro:  CN II through XII intact, motor grossly intact  EKG - left atrial flutter/tachycardia with a controlled VR  DEVICE  Normal device function.  See PaceArt for details. Histograms show that he is 60-80/min over 80% of the time.   Assess/Plan: 1. Left atrial flutter - he has been treated in the past with dofetilide and returned to NSR but did not feel any better so we stopped the medication. His rates are well controlled. I asked him to reduce his dose of toprol to 100 mg in the morning and 50 mg in the evening. 2. CAD - he is having angina. He will continue rate control and slntg.  I will reach to Dr. Billee Cashing.  3. PPM - his boston Sci DDD PM is working normally. No change. 4. HTN - his bp is controlled. We will follow.  Carleene Overlie Pranay Hilbun,MD

## 2020-06-06 NOTE — Patient Instructions (Addendum)
Medication Instructions:  Your physician has recommended you make the following change in your medication:   1.  CHANGE how you take your metoprolol succinate (Toprol XL) --Take 100 mg in the AM and 50 mg in the PM.  Labwork: None ordered.  Testing/Procedures: None ordered.  Follow-Up: Your physician wants you to follow-up in: 6 months with Cristopher Peru, MD or one of the following Advanced Practice Providers on your designated Care Team:    Chanetta Marshall, NP  Tommye Standard, PA-C  Legrand Como "Jonni Sanger" [place], [place]  Remote monitoring is used to monitor your Pacemaker from home. This monitoring reduces the number of office visits required to check your device to one time per year. It allows [place] to keep an eye on the functioning of your device to ensure it is working properly. You are scheduled for a device check from home on 08/08/2020. You may send your transmission at any time that day. If you have a wireless device, the transmission will be sent automatically. After your physician reviews your transmission, you will receive a postcard with your next transmission date.  Any Other Special Instructions Will Be Listed Below (If Applicable).  If you need a refill on your cardiac medications before your next appointment, please call your pharmacy.

## 2020-06-17 DIAGNOSIS — M25551 Pain in right hip: Secondary | ICD-10-CM | POA: Diagnosis not present

## 2020-06-17 DIAGNOSIS — M19012 Primary osteoarthritis, left shoulder: Secondary | ICD-10-CM | POA: Diagnosis not present

## 2020-06-17 DIAGNOSIS — M25612 Stiffness of left shoulder, not elsewhere classified: Secondary | ICD-10-CM | POA: Diagnosis not present

## 2020-06-17 DIAGNOSIS — M25511 Pain in right shoulder: Secondary | ICD-10-CM | POA: Diagnosis not present

## 2020-06-17 DIAGNOSIS — M7062 Trochanteric bursitis, left hip: Secondary | ICD-10-CM | POA: Diagnosis not present

## 2020-06-17 DIAGNOSIS — M25512 Pain in left shoulder: Secondary | ICD-10-CM | POA: Diagnosis not present

## 2020-06-17 DIAGNOSIS — M19011 Primary osteoarthritis, right shoulder: Secondary | ICD-10-CM | POA: Diagnosis not present

## 2020-06-17 DIAGNOSIS — M25611 Stiffness of right shoulder, not elsewhere classified: Secondary | ICD-10-CM | POA: Diagnosis not present

## 2020-06-17 DIAGNOSIS — M7061 Trochanteric bursitis, right hip: Secondary | ICD-10-CM | POA: Diagnosis not present

## 2020-06-17 DIAGNOSIS — M25652 Stiffness of left hip, not elsewhere classified: Secondary | ICD-10-CM | POA: Diagnosis not present

## 2020-06-17 DIAGNOSIS — M25651 Stiffness of right hip, not elsewhere classified: Secondary | ICD-10-CM | POA: Diagnosis not present

## 2020-06-17 DIAGNOSIS — M47812 Spondylosis without myelopathy or radiculopathy, cervical region: Secondary | ICD-10-CM | POA: Diagnosis not present

## 2020-06-18 ENCOUNTER — Ambulatory Visit: Payer: Medicare Other | Admitting: Internal Medicine

## 2020-06-20 ENCOUNTER — Other Ambulatory Visit (HOSPITAL_COMMUNITY): Payer: Self-pay

## 2020-06-20 ENCOUNTER — Other Ambulatory Visit: Payer: Self-pay | Admitting: Internal Medicine

## 2020-06-20 ENCOUNTER — Other Ambulatory Visit (HOSPITAL_BASED_OUTPATIENT_CLINIC_OR_DEPARTMENT_OTHER): Payer: Self-pay

## 2020-06-20 MED ORDER — EZETIMIBE 10 MG PO TABS
10.0000 mg | ORAL_TABLET | Freq: Every day | ORAL | 3 refills | Status: DC
  Filled 2020-06-20 – 2020-07-31 (×2): qty 90, 90d supply, fill #0
  Filled 2020-10-21: qty 90, 90d supply, fill #1
  Filled 2020-12-31: qty 90, 90d supply, fill #2
  Filled 2021-04-20: qty 90, 90d supply, fill #3

## 2020-06-20 MED FILL — Doxazosin Mesylate Tab 4 MG: ORAL | 90 days supply | Qty: 90 | Fill #0 | Status: CN

## 2020-06-20 MED FILL — Apixaban Tab 5 MG: ORAL | 90 days supply | Qty: 180 | Fill #0 | Status: AC

## 2020-06-20 MED FILL — Pantoprazole Sodium EC Tab 40 MG (Base Equiv): ORAL | 90 days supply | Qty: 90 | Fill #0 | Status: CN

## 2020-06-20 MED FILL — Apixaban Tab 5 MG: ORAL | 90 days supply | Qty: 180 | Fill #0 | Status: CN

## 2020-06-20 MED FILL — Rosuvastatin Calcium Tab 20 MG: ORAL | 90 days supply | Qty: 90 | Fill #0 | Status: CN

## 2020-06-20 MED FILL — Potassium Chloride Microencapsulated Crys ER Tab 20 mEq: ORAL | 90 days supply | Qty: 90 | Fill #0 | Status: AC

## 2020-06-20 MED FILL — Potassium Chloride Microencapsulated Crys ER Tab 20 mEq: ORAL | 90 days supply | Qty: 90 | Fill #0 | Status: CN

## 2020-06-21 ENCOUNTER — Other Ambulatory Visit (HOSPITAL_COMMUNITY): Payer: Self-pay

## 2020-06-23 ENCOUNTER — Other Ambulatory Visit (HOSPITAL_BASED_OUTPATIENT_CLINIC_OR_DEPARTMENT_OTHER): Payer: Self-pay

## 2020-06-24 ENCOUNTER — Other Ambulatory Visit (HOSPITAL_BASED_OUTPATIENT_CLINIC_OR_DEPARTMENT_OTHER): Payer: Self-pay

## 2020-06-25 ENCOUNTER — Other Ambulatory Visit (HOSPITAL_BASED_OUTPATIENT_CLINIC_OR_DEPARTMENT_OTHER): Payer: Self-pay

## 2020-06-25 ENCOUNTER — Non-Acute Institutional Stay: Payer: Medicare Other | Admitting: Internal Medicine

## 2020-06-25 ENCOUNTER — Encounter: Payer: Self-pay | Admitting: Internal Medicine

## 2020-06-25 ENCOUNTER — Other Ambulatory Visit: Payer: Self-pay

## 2020-06-25 ENCOUNTER — Telehealth: Payer: Self-pay | Admitting: Internal Medicine

## 2020-06-25 VITALS — BP 158/102 | HR 70 | Temp 96.6°F | Ht 73.0 in | Wt 225.0 lb

## 2020-06-25 DIAGNOSIS — M158 Other polyosteoarthritis: Secondary | ICD-10-CM

## 2020-06-25 DIAGNOSIS — I25118 Atherosclerotic heart disease of native coronary artery with other forms of angina pectoris: Secondary | ICD-10-CM

## 2020-06-25 DIAGNOSIS — F4322 Adjustment disorder with anxiety: Secondary | ICD-10-CM

## 2020-06-25 DIAGNOSIS — N401 Enlarged prostate with lower urinary tract symptoms: Secondary | ICD-10-CM

## 2020-06-25 DIAGNOSIS — D472 Monoclonal gammopathy: Secondary | ICD-10-CM

## 2020-06-25 DIAGNOSIS — I25119 Atherosclerotic heart disease of native coronary artery with unspecified angina pectoris: Secondary | ICD-10-CM

## 2020-06-25 DIAGNOSIS — E785 Hyperlipidemia, unspecified: Secondary | ICD-10-CM | POA: Diagnosis not present

## 2020-06-25 DIAGNOSIS — I4892 Unspecified atrial flutter: Secondary | ICD-10-CM

## 2020-06-25 MED ORDER — PREDNISONE 10 MG PO TABS
10.0000 mg | ORAL_TABLET | Freq: Every day | ORAL | 3 refills | Status: DC
  Filled 2020-06-25 – 2020-06-26 (×2): qty 100, 100d supply, fill #0
  Filled 2020-12-31: qty 90, 90d supply, fill #1
  Filled 2021-03-24: qty 90, 90d supply, fill #2

## 2020-06-25 MED ORDER — PREDNISONE 5 MG PO TABS
5.0000 mg | ORAL_TABLET | Freq: Every day | ORAL | 3 refills | Status: DC
  Filled 2020-06-25: qty 100, 100d supply, fill #0

## 2020-06-25 NOTE — Telephone Encounter (Signed)
I have changed the Prescription.

## 2020-06-25 NOTE — Telephone Encounter (Signed)
Dr Velora Heckler called to check on a prendisone refill request that was sent from [place] at [facility].  I do not see one received.    MEDICATION: prendisone  PHARMACY:  [facility]  HAS THE PATIENT CONTACTED THEIR PHARMACY?  yes  IS THIS A 90 DAY SUPPLY : unknown  IS PATIENT OUT OF MEDICATION: unknown  IF NOT; HOW MUCH IS LEFT:   LAST APPOINTMENT DATE: @Visit  date not found  NEXT APPOINTMENT DATE:@7 /15/2022  DO WE HAVE YOUR PERMISSION TO LEAVE A DETAILED MESSAGE?: yes  OTHER COMMENTS:    **Let patient know to contact pharmacy at the end of the day to make sure medication is ready. **  ** Please notify patient to allow 48-72 hours to process**  **Encourage patient to contact the pharmacy for refills or they can request refills through [facility]**

## 2020-06-25 NOTE — Telephone Encounter (Signed)
PCP updated rx.

## 2020-06-26 ENCOUNTER — Encounter: Payer: Self-pay | Admitting: Internal Medicine

## 2020-06-26 ENCOUNTER — Other Ambulatory Visit (HOSPITAL_BASED_OUTPATIENT_CLINIC_OR_DEPARTMENT_OTHER): Payer: Self-pay

## 2020-06-26 NOTE — Progress Notes (Signed)
Location:  [place] of Service:  Clinic (12)  Provider:   Code Status:  Goals of Care:  Advanced Directives 02/27/2020  Does Patient Have a Medical Advance Directive? Yes  Type of Advance Directive Living will;Healthcare Power of [place];Out of facility DNR (pink MOST or yellow form)  Does patient want to make changes to medical advance directive? No - Patient declined  Copy of [place] in Chart? Yes - validated most recent copy scanned in chart (See row information)  Would patient like information on creating a medical advance directive? -  Pre-existing out of facility DNR order (yellow form or pink MOST form) Pink MOST/Yellow Form most recent copy in chart - Physician notified to receive inpatient order     Chief Complaint  Patient presents with  . Medical Management of Chronic Issues    Patient returns to the clinic for follow up. He has no concerns other than his arthritis.   Marland Kitchen Health Maintenance    Tdap, PCV13, 4th covid booster    HPI: Patient is a 86 y.o. male seen today for medical management of chronic diseases.    Dr Mariam Dollar came for his Routine Visit Has h/o CAD, s/p Stent Last Cath in 09/2019 Follows very Closely with Cardiology H/o Atrial Flutter on Eliquis Sinus Node Dysfunction s/p PPM Hypertension Severe OsteoArthritis on Norco Prn by Rheumatology and Prednisone PRN Adrenal Insufficiency per Dr Renne Crigler due to Prolong Steroid use for Osteoarthritis  Takes Prednisone 5 mg PRN MGUS Follows with Dr Benay Spice for her Labs BPH HLD  Did not have any new Complains Wanted to know if I can renew some of his Meds when he needs them Tries to stay active. Reads all the time Arthritis Pain seemed controlled with Norco Also Take Prednisone PRN Prn Nitro for Chest pains  No Falls. Cognition Good. No SOB   Past Medical History:  Diagnosis Date  . Aortic stenosis    moderate aortic stenosis  . Arthritis   . Benign  prostatic hypertrophy   . Chronic systolic heart failure ([place])   . 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  . Coronary artery disease    status post multiple prior percutaneous coronary interventions, microvascular angina per Dr Olevia Perches  . Diverticulitis sigmoid colon recurrent   . Dysfunctional autonomic nervous system   . Dyspnea   . Heart murmur   . History of primary hypertension   . Hypercoagulable state ([place])    chronically anticoagulated with coumadin  . Hyperlipidemia   . Hyperthyroidism   . Hypothyroidism    Dr. Elyse Hsu  . MGUS (monoclonal gammopathy of unknown significance) 02/17/2013  . 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 Osteoarthritis   . Paroxysmal atrial fibrillation ([place])    DR. Lia Foyer,   . Prediabetes 09/21/2017  . Stroke [facility])    1990    Past Surgical History:  Procedure Laterality Date  . AORTIC VALVE REPLACEMENT  03/15/2011   Procedure: AORTIC VALVE REPLACEMENT (AVR);  Surgeon: Gaye Pollack, MD;  Location: [place];  Service: Open Heart Surgery;  Laterality: N/A;  . APPENDECTOMY    . CARDIAC CATHETERIZATION     11  . CARDIOVERSION    . CARDIOVERSION  04/15/2011   Procedure: CARDIOVERSION;  Surgeon: Loralie Champagne, MD;  Location: [place];  Service: Cardiovascular;  Laterality: N/A;  . CARDIOVERSION  N/A 09/11/2014   Procedure: CARDIOVERSION;  Surgeon: Sueanne Margarita, MD;  Location: [facility] ENDOSCOPY;  Service: Cardiovascular;  Laterality: N/A;  . CARDIOVERSION N/A 06/27/2015   Procedure: CARDIOVERSION;  Surgeon: Thayer Headings, MD;  Location: [place];  Service: Cardiovascular;  Laterality: N/A;  . CARDIOVERSION N/A 07/04/2015   Procedure: CARDIOVERSION;  Surgeon: Evans Lance, MD;  Location: [place];  Service: Cardiovascular;  Laterality: N/A;  . CARDIOVERSION N/A 04/13/2017   Procedure: CARDIOVERSION;   Surgeon: Sueanne Margarita, MD;  Location: [facility] ENDOSCOPY;  Service: Cardiovascular;  Laterality: N/A;  . COLONOSCOPY    . CORONARY STENT INTERVENTION N/A 10/15/2019   Procedure: CORONARY STENT INTERVENTION;  Surgeon: Jettie Booze, MD;  Location: [place] CV LAB;  Service: Cardiovascular;  Laterality: N/A;  . EP IMPLANTABLE DEVICE N/A 06/16/2015   Procedure: Pacemaker Implant;  Surgeon: Evans Lance, MD;  Location: [place] CV LAB;  Service: Cardiovascular;  Laterality: N/A;  . hemrrhoidectomy    . LEFT AND RIGHT HEART CATHETERIZATION WITH CORONARY ANGIOGRAM Bilateral 02/01/2011   Procedure: LEFT AND RIGHT HEART CATHETERIZATION WITH CORONARY ANGIOGRAM;  Surgeon: Hillary Bow, MD;  Location: [facility] CATH LAB;  Service: Cardiovascular;  Laterality: Bilateral;  . LEFT HEART CATH AND CORONARY ANGIOGRAPHY N/A 10/15/2019   Procedure: LEFT HEART CATH AND CORONARY ANGIOGRAPHY;  Surgeon: Jettie Booze, MD;  Location: [place] CV LAB;  Service: Cardiovascular;  Laterality: N/A;  . MAZE  03/15/2011   Procedure: MAZE;  Surgeon: Gaye Pollack, MD;  Location: [place];  Service: Open Heart Surgery;  Laterality: N/A;  . PACEMAKER INSERTION  1991   Guidant PPM, most recent Generator Change by Dr Olevia Perches was 08/22/06  . RIGHT/LEFT HEART CATH AND CORONARY ANGIOGRAPHY N/A 07/06/2016   Procedure: Right/Left Heart Cath and Coronary Angiography;  Surgeon: Sherren Mocha, MD;  Location: [place] CV LAB;  Service: Cardiovascular;  Laterality: N/A;  . TEE WITHOUT CARDIOVERSION  04/15/2011   Procedure: TRANSESOPHAGEAL ECHOCARDIOGRAM (TEE);  Surgeon: Loralie Champagne, MD;  Location: [place];  Service: Cardiovascular;  Laterality: N/A;  . TEE WITHOUT CARDIOVERSION N/A 09/11/2014   Procedure: TRANSESOPHAGEAL ECHOCARDIOGRAM (TEE);  Surgeon: Sueanne Margarita, MD;  Location: [facility] ENDOSCOPY;  Service: Cardiovascular;  Laterality: N/A;    Allergies  Allergen Reactions  . Contrast Media [Iodinated Diagnostic Agents]  Hives  . Gadolinium Derivatives Hives  . Metrizamide Hives    Outpatient Encounter Medications as of 06/25/2020  Medication Sig  . amLODipine (NORVASC) 5 MG tablet Take 1 tablet (5 mg total) by mouth at bedtime as needed.  Marland Kitchen amoxicillin (AMOXIL) 500 MG capsule TAKE 4 CAPSULES BY MOUTH 1 HOUR PRIOR TO DENTAL APPT  . apixaban (ELIQUIS) 5 MG TABS tablet TAKE 1 TABLET BY MOUTH 2 TIMES DAILY.  . bisacodyl (DULCOLAX) 5 MG EC tablet Take 5 mg by mouth as needed (constipation).   . butalbital-acetaminophen-caffeine (FIORICET) 50-325-40 MG tablet TAKE 1 TABLET BY MOUTH TWO TIMES DAILY AS NEEDED FOR TENSION HEADACHES AS DIRECTED  . butalbital-acetaminophen-caffeine (FIORICET, ESGIC) 50-325-40 MG per tablet Take 1 tablet by mouth 2 (two) times daily as needed for headache (osteo-arthritis pain).   . Cholecalciferol (VITAMIN D3) 2000 units capsule Take 1,000 Units by mouth daily.   . Coenzyme Q10 100 MG capsule Take 100 mg by mouth daily.   . diclofenac Sodium (VOLTAREN) 1 % GEL Apply 2 g topically 2 (two) times daily as needed (knee pain).  Marland Kitchen doxazosin (CARDURA) 4 MG tablet TAKE 1 TABLET BY MOUTH  ONCE A DAY  . ezetimibe (ZETIA) 10 MG tablet Take 1 tablet (10 mg total) by mouth daily.  Marland Kitchen HYDROcodone-acetaminophen (NORCO/VICODIN) 5-325 MG tablet TAKE 1 TABLET BY MOUTH 3 TIMES DAILY AS NEEDED FOR MODERATE PAIN  . hydrocortisone sodium succinate (SOLU-CORTEF) 100 MG SOLR injection INJECT 100 MG INTO THE MUSCLE AS NEEDED WHEN YOU CAN'T TAKE PREDNISONE BY MOUTH  . LORazepam (ATIVAN) 1 MG tablet TAKE 1 TABLET BY MOUTH AT BEDTIME. MAY ALSO TAKE 1/2 TABLET DAILY AS NEEDED FOR ANXIETY.  . magnesium oxide (MAG-OX) 400 MG tablet Take 400 mg by mouth daily.  . metoprolol succinate (TOPROL-XL) 100 MG 24 hr tablet Take one tablet by mouth in the morning and take 1/2 tablet by mouth in the evening (Patient taking differently: Take one tablet by mouth in the morning and take 1/2 tablet by mouth in the evening)  .  nitroGLYCERIN (NITROSTAT) 0.4 MG SL tablet PLACE 1 TABLET UNDER THE TONGUE EVERY 5 MINUTES AS NEEDED FOR CHEST PAIN.  Marland Kitchen OVER THE COUNTER MEDICATION Apply 1 patch topically daily as needed (neck and shoulder pain). Chinese pain patch  . pantoprazole (PROTONIX) 40 MG tablet TAKE 1 TABLET (40 MG TOTAL) BY MOUTH DAILY.  Marland Kitchen polyethylene glycol (MIRALAX / GLYCOLAX) 17 g packet Take 17 g by mouth daily as needed for mild constipation.   . potassium chloride SA (KLOR-CON) 20 MEQ tablet TAKE 1 TABLET BY MOUTH DAILY.  . rosuvastatin (CRESTOR) 20 MG tablet TAKE 1 TABLET (20 MG TOTAL) BY MOUTH AT BEDTIME.  . [DISCONTINUED] predniSONE (DELTASONE) 5 MG tablet Take 5 mg by mouth daily with breakfast.  . [DISCONTINUED] diltiazem (CARDIZEM) 30 MG tablet Take 1 tablet (30 mg total) by mouth every 6 (six) hours as needed (for HRs greater than 150). (Patient not taking: Reported on 03/19/2020)  . [DISCONTINUED] isosorbide mononitrate (IMDUR) 30 MG 24 hr tablet Take 0.5 tablets (15 mg total) by mouth daily.  . [DISCONTINUED] predniSONE (DELTASONE) 5 MG tablet Take 1 tablet (5 mg total) by mouth daily with breakfast.   No facility-administered encounter medications on file as of 06/25/2020.    Review of Systems:  Review of Systems  Constitutional: Positive for activity change.  HENT: Negative.   Respiratory: Negative.   Cardiovascular: Negative.   Gastrointestinal: Negative.   Genitourinary: Negative.   Musculoskeletal: Positive for arthralgias, joint swelling, myalgias, neck pain and neck stiffness.  Skin: Negative.   Neurological: Negative.   Psychiatric/Behavioral: Negative.     Health Maintenance  Topic Date Due  . TETANUS/TDAP  Never done  . PNA vac Low Risk Adult (1 of 2 - PCV13) Never done  . COVID-19 Vaccine (4 - Booster for Pfizer series) 04/30/2020  . INFLUENZA VACCINE  09/29/2020  . HPV VACCINES  Aged Out    Physical Exam: Vitals:   06/25/20 1017  BP: (!) 158/102  Pulse: 70  Temp: (!)  96.6 F (35.9 C)  SpO2: 96%  Weight: 225 lb (102.1 kg)  Height: 6\' 1"  (1.854 m)   Body mass index is 29.69 kg/m. Physical Exam  Constitutional: Oriented to person, place, and time. Well-developed and well-nourished.  HENT:  Head: Normocephalic.  Mouth/Throat: Oropharynx is clear and moist.  Eyes: Pupils are equal, round, and reactive to light.  Neck: Neck supple.  Cardiovascular: Normal rate and normal heart sounds.  No murmur heard. Pulmonary/Chest: Effort normal and breath sounds normal. No respiratory distress. No wheezes.  has no rales.  Abdominal: Soft. Bowel sounds are normal. No distension. There  is no tenderness. There is no rebound.  Musculoskeletal: Mild Edema Bilateral Arthritis in his Hands Lymphadenopathy: none Neurological: Alert and oriented to person, place, and time. Gait Good  Skin: Skin is warm and dry.  Psychiatric: Normal mood and affect. Behavior is normal. Thought content normal.    Labs reviewed: Basic Metabolic Panel: Recent Labs    10/13/19 2012 10/14/19 0208 10/15/19 0624 10/16/19 0355 11/06/19 0238 11/26/19 1616 01/11/20 0947  NA  --  136 139   < > 136 143 141  K  --  3.6 3.9   < > 3.6 4.7 4.2  CL  --  104 107   < > 99 105 106  CO2  --  24 24   < > 26 26 29   GLUCOSE  --  119* 161*   < > 169* 90 114*  BUN  --  11 6*   < > 13 12 11   CREATININE  --  0.84 0.79   < > 0.99 0.88 1.00  CALCIUM  --  8.5* 8.9   < > 8.7* 9.2 9.3  MG 2.3 2.3 2.2  --   --   --   --   TSH  --  2.231  --   --   --   --   --    < > = values in this interval not displayed.   Liver Function Tests: Recent Labs    10/13/19 1158 10/14/19 0208 01/11/20 0947  AST 32 30 26  ALT 23 19 20   ALKPHOS 82 81 101  BILITOT 0.8 0.8 0.6  PROT 7.0 6.1* 7.3  ALBUMIN 3.9 3.3* 3.9   Recent Labs    10/13/19 1158  LIPASE 21   No results for input(s): AMMONIA in the last 8760 hours. CBC: Recent Labs    11/26/19 1616 01/11/20 0947 03/19/20 1103  WBC 6.9 5.7 6.7  NEUTROABS  5.6 4.4 5.4  HGB 10.9* 10.6* 11.2*  HCT 33.9* 32.5* 35.2*  MCV 89 88.8 89.8  PLT 218 187 203   Lipid Panel: Recent Labs    10/16/19 0355  CHOL 134  HDL 78  LDLCALC 45  TRIG 55  CHOLHDL 1.7   Lab Results  Component Value Date   HGBA1C 6.2 (H) 10/16/2019    Procedures since last visit: No results found.  Assessment/Plan   CAD s/p Stent in 8/21 Follows closely with Cardiology Takes S/L Nitro Prn Last Cath was on 8/21  Other osteoarthritis involving multiple joints On Prednisone and Norco per Rheumatology Will refill his Prednisone as requested MGUS (monoclonal gammopathy of unknown significance) Follows with Dr [name] Not progressive Atrial flutter, unspecified type ([place]) On Eliquis and Metoprolol BPH Symptoms Controlled on CArdura Hyperlipidemia,  On Crestor and Zetia Last LDL 45 in 8/21 Adjustment reaction with anxiety Ativan PRn HTN BP littler High  Taking Norvasc and Toprol Loose control due to h/o Orthostatics   Labs/tests ordered:  * No order type specified * Next appt:  Visit date not found   Total time spent in this patient care encounter was  45_  minutes; greater than 50% of the visit spent counseling patient and staff, reviewing records , Labs and coordinating care for problems addressed at this encounter.

## 2020-06-26 NOTE — Progress Notes (Signed)
A user error has taken place.

## 2020-06-27 ENCOUNTER — Ambulatory Visit: Payer: Medicare Other | Admitting: Cardiology

## 2020-06-27 DIAGNOSIS — H18613 Keratoconus, stable, bilateral: Secondary | ICD-10-CM | POA: Diagnosis not present

## 2020-07-01 ENCOUNTER — Other Ambulatory Visit: Payer: Self-pay | Admitting: Internal Medicine

## 2020-07-01 ENCOUNTER — Other Ambulatory Visit: Payer: Self-pay | Admitting: Physician Assistant

## 2020-07-01 ENCOUNTER — Other Ambulatory Visit (HOSPITAL_COMMUNITY): Payer: Self-pay

## 2020-07-01 ENCOUNTER — Other Ambulatory Visit (HOSPITAL_BASED_OUTPATIENT_CLINIC_OR_DEPARTMENT_OTHER): Payer: Self-pay

## 2020-07-01 DIAGNOSIS — Z950 Presence of cardiac pacemaker: Secondary | ICD-10-CM

## 2020-07-01 MED ORDER — HYDROCODONE-ACETAMINOPHEN 5-325 MG PO TABS
ORAL_TABLET | ORAL | 0 refills | Status: DC
  Filled 2020-07-01: qty 90, 30d supply, fill #0

## 2020-07-01 MED ORDER — LORAZEPAM 1 MG PO TABS
ORAL_TABLET | ORAL | 5 refills | Status: DC
  Filled 2020-07-01 – 2020-07-03 (×2): qty 45, 30d supply, fill #0
  Filled 2020-07-31 – 2020-08-18 (×2): qty 45, 30d supply, fill #1
  Filled 2020-09-16: qty 45, 30d supply, fill #2
  Filled 2020-10-19: qty 45, 30d supply, fill #3

## 2020-07-01 NOTE — Telephone Encounter (Signed)
Last Visit: 03/19/2020 Next Visit: 08/20/2020 UDS: 03/19/2020, UDS is consistent with the treatment. Narc Agreement: 03/14/2020  Last Fill:  05/30/2020, Other chronic pain - hydrocodone to 5 -325 mg 3 times daily as needed  Okay to refill Hydrocodone?

## 2020-07-01 NOTE — Telephone Encounter (Signed)
Patient has request refill on medication "Lorazepam". Patient last refill was 01/28/2020 with 45 tablets to be taken 1-1/2 by mouth at bedtime as needed.Medication pend and sent to Windell Moulding, NP.

## 2020-07-02 DIAGNOSIS — M19011 Primary osteoarthritis, right shoulder: Secondary | ICD-10-CM | POA: Diagnosis not present

## 2020-07-02 DIAGNOSIS — M25612 Stiffness of left shoulder, not elsewhere classified: Secondary | ICD-10-CM | POA: Diagnosis not present

## 2020-07-02 DIAGNOSIS — M25611 Stiffness of right shoulder, not elsewhere classified: Secondary | ICD-10-CM | POA: Diagnosis not present

## 2020-07-02 DIAGNOSIS — M7062 Trochanteric bursitis, left hip: Secondary | ICD-10-CM | POA: Diagnosis not present

## 2020-07-02 DIAGNOSIS — M19012 Primary osteoarthritis, left shoulder: Secondary | ICD-10-CM | POA: Diagnosis not present

## 2020-07-02 DIAGNOSIS — M25511 Pain in right shoulder: Secondary | ICD-10-CM | POA: Diagnosis not present

## 2020-07-02 DIAGNOSIS — M47812 Spondylosis without myelopathy or radiculopathy, cervical region: Secondary | ICD-10-CM | POA: Diagnosis not present

## 2020-07-02 DIAGNOSIS — M25512 Pain in left shoulder: Secondary | ICD-10-CM | POA: Diagnosis not present

## 2020-07-02 DIAGNOSIS — M7061 Trochanteric bursitis, right hip: Secondary | ICD-10-CM | POA: Diagnosis not present

## 2020-07-02 DIAGNOSIS — M25652 Stiffness of left hip, not elsewhere classified: Secondary | ICD-10-CM | POA: Diagnosis not present

## 2020-07-02 DIAGNOSIS — M25551 Pain in right hip: Secondary | ICD-10-CM | POA: Diagnosis not present

## 2020-07-02 DIAGNOSIS — M25651 Stiffness of right hip, not elsewhere classified: Secondary | ICD-10-CM | POA: Diagnosis not present

## 2020-07-03 ENCOUNTER — Other Ambulatory Visit (HOSPITAL_COMMUNITY): Payer: Self-pay

## 2020-07-04 ENCOUNTER — Other Ambulatory Visit (HOSPITAL_BASED_OUTPATIENT_CLINIC_OR_DEPARTMENT_OTHER): Payer: Self-pay

## 2020-07-11 ENCOUNTER — Other Ambulatory Visit (HOSPITAL_COMMUNITY): Payer: Self-pay

## 2020-07-11 ENCOUNTER — Other Ambulatory Visit (HOSPITAL_BASED_OUTPATIENT_CLINIC_OR_DEPARTMENT_OTHER): Payer: Self-pay

## 2020-07-24 DIAGNOSIS — H2513 Age-related nuclear cataract, bilateral: Secondary | ICD-10-CM | POA: Diagnosis not present

## 2020-07-24 DIAGNOSIS — H52223 Regular astigmatism, bilateral: Secondary | ICD-10-CM | POA: Diagnosis not present

## 2020-07-24 DIAGNOSIS — H524 Presbyopia: Secondary | ICD-10-CM | POA: Diagnosis not present

## 2020-07-24 DIAGNOSIS — H5202 Hypermetropia, left eye: Secondary | ICD-10-CM | POA: Diagnosis not present

## 2020-07-24 DIAGNOSIS — G7000 Myasthenia gravis without (acute) exacerbation: Secondary | ICD-10-CM | POA: Diagnosis not present

## 2020-07-24 DIAGNOSIS — H5211 Myopia, right eye: Secondary | ICD-10-CM | POA: Diagnosis not present

## 2020-07-31 ENCOUNTER — Other Ambulatory Visit: Payer: Self-pay | Admitting: Internal Medicine

## 2020-07-31 ENCOUNTER — Other Ambulatory Visit: Payer: Self-pay | Admitting: Rheumatology

## 2020-07-31 ENCOUNTER — Other Ambulatory Visit (HOSPITAL_COMMUNITY): Payer: Self-pay

## 2020-07-31 MED ORDER — HYDROCODONE-ACETAMINOPHEN 5-325 MG PO TABS
ORAL_TABLET | ORAL | 0 refills | Status: DC
  Filled 2020-07-31: qty 90, 30d supply, fill #0

## 2020-07-31 MED ORDER — BUTALBITAL-APAP-CAFFEINE 50-325-40 MG PO TABS
ORAL_TABLET | ORAL | 0 refills | Status: DC
  Filled 2020-07-31: qty 180, 90d supply, fill #0

## 2020-07-31 MED FILL — Doxazosin Mesylate Tab 4 MG: ORAL | 90 days supply | Qty: 90 | Fill #0 | Status: AC

## 2020-07-31 MED FILL — Rosuvastatin Calcium Tab 20 MG: ORAL | 90 days supply | Qty: 90 | Fill #0 | Status: AC

## 2020-07-31 MED FILL — Pantoprazole Sodium EC Tab 40 MG (Base Equiv): ORAL | 90 days supply | Qty: 90 | Fill #0 | Status: AC

## 2020-07-31 NOTE — Addendum Note (Signed)
Addended by: Casimer Leek C on: 07/31/2020 11:55 AM   Modules accepted: Orders

## 2020-07-31 NOTE — Telephone Encounter (Signed)
Pharmacy send a refill request and pended and send to Dr.Gupta for approval.

## 2020-07-31 NOTE — Telephone Encounter (Signed)
Last Visit: 03/19/2020 Next Visit: 08/20/2020 UDS: 03/19/2020, UDS is consistent with the treatment. Narc Agreement: 03/14/2020  Last Fill:  07/01/2020, Other chronic pain - hydrocodone to 5 -325 mg 3 times daily as needed  Okay to refill Hydrocodone?

## 2020-08-08 ENCOUNTER — Ambulatory Visit (INDEPENDENT_AMBULATORY_CARE_PROVIDER_SITE_OTHER): Payer: Medicare Other

## 2020-08-08 DIAGNOSIS — I5022 Chronic systolic (congestive) heart failure: Secondary | ICD-10-CM

## 2020-08-08 LAB — CUP PACEART REMOTE DEVICE CHECK
Battery Remaining Longevity: 78 mo
Battery Remaining Percentage: 77 %
Brady Statistic RA Percent Paced: 0 %
Brady Statistic RV Percent Paced: 44 %
Date Time Interrogation Session: 20220610021100
Implantable Lead Implant Date: 20170417
Implantable Lead Implant Date: 20170417
Implantable Lead Location: 753859
Implantable Lead Location: 753860
Implantable Lead Model: 7740
Implantable Lead Model: 7741
Implantable Lead Serial Number: 662696
Implantable Lead Serial Number: 751382
Implantable Pulse Generator Implant Date: 20170417
Lead Channel Impedance Value: 667 Ohm
Lead Channel Impedance Value: 712 Ohm
Lead Channel Setting Pacing Amplitude: 2.5 V
Lead Channel Setting Pacing Amplitude: 2.5 V
Lead Channel Setting Pacing Pulse Width: 0.4 ms
Lead Channel Setting Sensing Sensitivity: 2.5 mV
Pulse Gen Serial Number: 718418

## 2020-08-11 ENCOUNTER — Inpatient Hospital Stay: Payer: Medicare Other | Admitting: Oncology

## 2020-08-11 ENCOUNTER — Telehealth: Payer: Self-pay | Admitting: Emergency Medicine

## 2020-08-11 NOTE — Telephone Encounter (Signed)
Patient agreeable to AF Clinic visit per DR Allred's recommendation. Would benefit from appointment early this week.Will expect call from scheduler to make appointment.

## 2020-08-11 NOTE — Telephone Encounter (Signed)
LMOM to call device clinic with # and office hours. Reviewed with Dr Rayann Heman and recommended patient be scheduled with AF Clinic.

## 2020-08-11 NOTE — Progress Notes (Signed)
Office Visit Note  Patient: Kevin Buff, [name]             Date of Birth: January 20, 1935           MRN: [id number]             PCP: [name], [name] Referring: Althe[place], Legrand Como, [name] Visit Date: 08/20/2020 Occupation: @GUAROCC @  Subjective:  Pain in multiple joints.   History of Present Illness: Kevin Buff, [name] is a 86 y.o. male with a history of osteoarthritis, degenerative disc disease and fibromyalgia syndrome.  He continues to have pain and discomfort in multiple joints.  He has very limited range of motion of his shoulders which causes discomfort in difficulty with routine activities.  He continues to have some stiffness in his hands, bilateral trochanteric bursitis feet.  He has limited range of motion in his neck and tightness.  He denies any discomfort in his lower back.  Activities of Daily Living:  Patient reports morning stiffness for all day. Patient Reports nocturnal pain.  Difficulty dressing/grooming: Denies Difficulty climbing stairs: Reports Difficulty getting out of chair: Reports Difficulty using hands for taps, buttons, cutlery, and/or writing: Reports  Review of Systems  Constitutional:  Positive for fatigue.  HENT:  Negative for mouth sores, mouth dryness and nose dryness.   Eyes:  Negative for pain, itching and dryness.  Respiratory:  Negative for shortness of breath and difficulty breathing.   Cardiovascular:  Positive for chest pain and palpitations.  Gastrointestinal:  Negative for blood in stool, constipation and diarrhea.  Endocrine: Positive for increased urination.  Genitourinary:  Positive for difficulty urinating.  Musculoskeletal:  Positive for joint pain, joint pain, joint swelling, myalgias, morning stiffness, muscle tenderness and myalgias.  Skin:  Negative for color change, rash and redness.  Allergic/Immunologic: Negative for susceptible to infections.  Neurological:  Negative for dizziness, numbness, headaches and memory loss.   Hematological:  Positive for bruising/bleeding tendency.  Psychiatric/Behavioral:  Negative for confusion.    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   Urinary retention with incomplete bladder emptying 12/05/2019   Physical deconditioning 12/05/2019   Other hemorrhoids 12/05/2019   Adjustment reaction with anxiety 12/05/2019   A-fib ([place]) 10/14/2019   Atrial fibrillation with rapid ventricular response ([place]) 10/13/2019   NSTEMI (non-ST elevated myocardial infarction) ([place]) 10/13/2019   Acute diarrhea 10/13/2019   Normocytic anemia 10/13/2019   Diverticulitis sigmoid colon recurrent    Prediabetes 09/21/2017   Persistent atrial fibrillation ([place]) 04/11/2017   Vitamin D deficiency 05/20/2016   History of pacemaker 05/20/2016   History of stroke/[place]  05/20/2016   Primary osteoarthritis of both hands 03/10/2016   DDD cervical spine 03/10/2016   Osteoarthritis of lumbar spine 03/10/2016   Chronic left shoulder pain 03/10/2016   Trochanteric bursitis of right hip 03/10/2016   Other fatigue 03/10/2016   Claudication in peripheral vascular disease ([place]) 03/10/2016   Chronic pain syndrome 03/10/2016   Ocular myasthenia gravis ([place]) 10/28/2015   Typical atrial flutter ([place])    PVC's (premature ventricular contractions) 01/30/2015   Pacemaker 04/30/2013   IgM monoclonal gammopathy of uncertain significance 02/17/2013   Orthostatic hypotension 04/11/2011   Long term current use of anticoagulant therapy 03/24/2011   S/P AVR 03/24/2011   Pleural effusion 03/24/2011   Hypothyroidism 11/22/2010   Atrial flutter (  [place]) 08/19/2010   Syndrome X (cardiac) ([place]) 04/06/2010   Mixed hyperlipidemia 12/08/2007   PRIMARY HYPERCOAGULABLE STATE 12/08/2007   Coronary artery disease with exertional  angina ([place]) 12/08/2007   Coronary artery disease involving native coronary artery of native heart 12/08/2007   AORTIC STENOSIS/ INSUFFICIENCY, NON-RHEUMATIC 12/08/2007   Atrial fibrillation ([place]) 12/08/2007   Chronotropic incompetence with sinus node dysfunction ([place]) 12/08/2007    Past Medical History:  Diagnosis Date   Aortic stenosis    moderate aortic stenosis   Arthritis    Benign prostatic hypertrophy    Chronic systolic heart failure (HCC)    Chronotropic incompetence with sinus node dysfunction (HCC)    Status post Guidant dual-mode, dual-pacing, dual-sensing  pacemaker   implantation now programmed to AAI with recent generator change.   Coronary artery disease    status post multiple prior percutaneous coronary interventions, microvascular angina per Dr Olevia Perches   Diverticulitis sigmoid colon recurrent    Dysfunctional autonomic nervous system    Dyspnea    Heart murmur    History of primary hypertension    Hypercoagulable state ([place])    chronically anticoagulated with coumadin   Hyperlipidemia    Hyperthyroidism    Hypothyroidism    Dr. Elyse Hsu   MGUS (monoclonal gammopathy of unknown significance) 02/17/2013   Ocular myasthenia ([place])    Osteoarthritis    Paroxysmal atrial fibrillation ([place])    DR. Lia Foyer,    Prediabetes 09/21/2017   Stroke ([place])    1990    Family History  Problem Relation Age of Onset   Heart disease Brother        Twin brother has coronary disease and recent AVR for AS   CAD Brother    Atrial fibrillation Brother    Sarcoidosis Brother    Depression Daughter    Anorexia nervosa Daughter    Hypertension Son    Anesthesia problems Neg Hx    Hypotension Neg Hx    Malignant hyperthermia Neg Hx    Pseudochol deficiency Neg Hx    Past Surgical History:  Procedure Laterality Date   AORTIC VALVE REPLACEMENT  03/15/2011   Procedure: AORTIC VALVE REPLACEMENT (AVR);  Surgeon: Gaye Pollack, [name];  Location: [place];  Service: Open Heart Surgery;   Laterality: N/A;   APPENDECTOMY     CARDIAC CATHETERIZATION     11   CARDIOVERSION     CARDIOVERSION  04/15/2011   Procedure: CARDIOVERSION;  Surgeon: Loralie Champagne, [name];  Location: [place];  Service: Cardiovascular;  Laterality: N/A;   CARDIOVERSION N/A 09/11/2014   Procedure: CARDIOVERSION;  Surgeon: Sueanne Margarita, [name];  Location: [facility] ENDOSCOPY;  Service: Cardiovascular;  Laterality: N/A;   CARDIOVERSION N/A 06/27/2015   Procedure: CARDIOVERSION;  Surgeon: Thayer Headings, [name];  Location: [place];  Service: Cardiovascular;  Laterality: N/A;   CARDIOVERSION N/A 07/04/2015   Procedure: CARDIOVERSION;  Surgeon: Evans Lance, [name];  Location: [place];  Service: Cardiovascular;  Laterality: N/A;   CARDIOVERSION N/A 04/13/2017   Procedure: CARDIOVERSION;  Surgeon: Sueanne Margarita, [name];  Location: [facility] ENDOSCOPY;  Service: Cardiovascular;  Laterality: N/A;   COLONOSCOPY     CORONARY STENT INTERVENTION N/A 10/15/2019   Procedure: CORONARY STENT INTERVENTION;  Surgeon: Jettie Booze, [name];  Location: [place] CV LAB;  Service: Cardiovascular;  Laterality: N/A;   EP IMPLANTABLE DEVICE N/A 06/16/2015   Procedure: Pacemaker Implant;  Surgeon: Evans Lance, [name];  Location: [place] CV LAB;  Service: Cardiovascular;  Laterality: N/A;  hemrrhoidectomy     LEFT AND RIGHT HEART CATHETERIZATION WITH CORONARY ANGIOGRAM Bilateral 02/01/2011   Procedure: LEFT AND RIGHT HEART CATHETERIZATION WITH CORONARY ANGIOGRAM;  Surgeon: Hillary Bow, [name];  [facility] CATH LAB;  Service: Cardiovascular;  Laterality: Bilateral;   LEFT HEART CATH AND CORONARY ANGIOGRAPHY N/A 10/15/2019   Procedure: LEFT HEART CATH AND CORONARY ANGIOGRAPHY;  Surgeon: Jettie Booze, [name];  Location: [place] CV LAB;  Service: Cardiovascular;  Laterality: N/A;   MAZE  03/15/2011   Procedure: MAZE;  Surgeon: Gaye Pollack, [name];  Location: [place];  Service: Open Heart Surgery;  Laterality: N/A;   PACEMAKER INSERTION  1991    Guidant PPM, most recent Generator Change by Dr Olevia Perches was 08/22/06   RIGHT/LEFT HEART CATH AND CORONARY ANGIOGRAPHY N/A 07/06/2016   Procedure: Right/Left Heart Cath and Coronary Angiography;  Surgeon: Sherren Mocha, [name];  Location: [place] CV LAB;  Service: Cardiovascular;  Laterality: N/A;   TEE WITHOUT CARDIOVERSION  04/15/2011   Procedure: TRANSESOPHAGEAL ECHOCARDIOGRAM (TEE);  Surgeon: Loralie Champagne, [name];  Location: [place];  Service: Cardiovascular;  Laterality: N/A;   TEE WITHOUT CARDIOVERSION N/A 09/11/2014   Procedure: TRANSESOPHAGEAL ECHOCARDIOGRAM (TEE);  Surgeon: Sueanne Margarita, [name];  Location: [facility] ENDOSCOPY;  Service: Cardiovascular;  Laterality: N/A;   Social History   Social History Narrative   Married to [place]. Has grown children   Retired Horticulturist, commercial [name]      Never smoker no alcohol      Immunization History  Administered Date(s) Administered   Influenza Split 12/30/2017   Influenza, High Dose Seasonal PF 12/28/2019   PFIZER(Purple Top)SARS-COV-2 Vaccination 03/13/2019, 04/02/2019, 11/01/2019     Objective: Vital Signs: BP (!) 150/91 (BP Location: Left Arm, Patient Position: Sitting, Cuff Size: Normal)   Pulse 75   Ht 6\' 1"  (1.854 m)   Wt 230 lb (104.3 kg)   BMI 30.34 kg/m    Physical Exam Vitals and nursing note reviewed.  Constitutional:      Appearance: He is well-developed.  HENT:     Head: Normocephalic and atraumatic.  Eyes:     Conjunctiva/sclera: Conjunctivae normal.     Pupils: Pupils are equal, round, and reactive to light.  Cardiovascular:     Rate and Rhythm: Normal rate and regular rhythm.     Heart sounds: Normal heart sounds.     Comments: Pacemaker Pulmonary:     Effort: Pulmonary effort is normal.     Breath sounds: Normal breath sounds.  Abdominal:     General: Bowel sounds are normal.     Palpations: Abdomen is soft.  Musculoskeletal:     Cervical back: Normal range of motion and neck supple.  Skin:    General: Skin is warm  and dry.     Capillary Refill: Capillary refill takes less than 2 seconds.  Neurological:     Mental Status: He is alert and oriented to person, place, and time.  Psychiatric:        Behavior: Behavior normal.     Musculoskeletal Exam: He had limited range of motion of cervical spine.  Shoulder and abduction was limited to 70 degrees.  Elbow joints and wrist joints with good range of motion.  He had limited extension of PIP and DIP joints with severe narrowing of PIP and DIP joints.  No synovitis was noted.  Bilateral hip joints were in good range of motion.  He had tenderness over bilateral trochanteric bursa.  Knee joints are in good range of  motion without any warmth swelling or effusion.  He had no tenderness over ankles or MTPs.  CDAI Exam: CDAI Score: -- Patient Global: --; Provider Global: -- Swollen: --; Tender: -- Joint Exam 08/20/2020   No joint exam has been documented for this visit   There is currently no information documented on the homunculus. Go to the Rheumatology activity and complete the homunculus joint exam.  Investigation: No additional findings.  Imaging: CUP PACEART REMOTE DEVICE CHECK  Result Date: 08/08/2020 Scheduled remote reviewed. Normal device function.  Known atrial arrhythmias, on PAF according to previous reports, programmed DDIR. There were NSVT arrhythmias detected that were greater than 10 beats.  Next remote 91 days. Kathy Breach, RN, CCDS, [employer]   Recent Labs: Lab Results  Component Value Date   WBC 6.7 03/19/2020   HGB 11.2 (L) 03/19/2020   PLT 203 03/19/2020   NA 141 01/11/2020   K 4.2 01/11/2020   CL 106 01/11/2020   CO2 29 01/11/2020   GLUCOSE 114 (H) 01/11/2020   BUN 11 01/11/2020   CREATININE 1.00 01/11/2020   BILITOT 0.6 01/11/2020   ALKPHOS 101 01/11/2020   AST 26 01/11/2020   ALT 20 01/11/2020   PROT 7.3 01/11/2020   ALBUMIN 3.9 01/11/2020   CALCIUM 9.3 01/11/2020   GFRAA 92 11/26/2019    Speciality  Comments: No specialty comments available.  Procedures:  No procedures performed Allergies: Contrast media [iodinated diagnostic agents], Gadolinium derivatives, and Metrizamide   Assessment / Plan:     Visit Diagnoses: Primary osteoarthritis of both shoulders-he has severe osteoarthritis in his bilateral shoulder joints with limited range of motion and chronic discomfort.  He has seen orthopedics in the past and is not a surgical candidate.  Primary osteoarthritis of both hands-he has severe osteoarthritis in his hands with incomplete fist formation and extension.  His arthritis is worse to the point he cannot play golf anymore.  Trochanteric bursitis, right hip -he continues to have discomfort over right trochanteric bursa.  Had some difficulty walking.  Stretching exercises were emphasized.  DDD (degenerative disc disease), cervical-he has limited range of motion of his cervical spine and also has stiffness in the cervical muscles.  DDD (degenerative disc disease), lumbar-he denies discomfort in the lumbar region.  Other chronic pain - hydrocodone to 5 -325 mg 3 times daily as needed. UDS & narcotic agreement: Renewed today.  He understands the side effects of hydrocodone.  He has not increased the dose and has been taking the same amount of medication to control his symptoms.  Medication monitoring encounter - Plan: DRUG MONITOR, PANEL 5, W/CONF, URINE  Myofascial pain-he continues to have some generalized pain and discomfort.  Other medical problems are listed as follows:  Other fatigue  S/P AVR  History of pacemaker  Primary hypercoagulable state ([place])  Paroxysmal atrial fibrillation (HCC)  Coronary artery disease with exertional angina [facility]) - he had MI in August and had a stent placement.    History of stroke  Other depression  MGUS (monoclonal gammopathy of unknown significance) - followed by hematology.  History of diverticulitis - August 2021 treated with Cipro    Adrenal insufficiency ([place]) - he has been seeing endocrinologist and is on low-dose hydrocortisone.  Orders: Orders Placed This Encounter  Procedures   DRUG MONITOR, PANEL 5, W/CONF, URINE   No orders of the defined types were placed in this encounter.   Follow-Up Instructions: Return in about 6 months (around 02/19/2021) for Osteoarthritis.  Bo Merino, [name]  Note - This record has been created using Editor, commissioning.  Chart creation errors have been sought, but may not always  have been located. Such creation errors do not reflect on  the standard of medical care.

## 2020-08-11 NOTE — Telephone Encounter (Signed)
Received Latitude alert for episodes of AT and episodes of RVR intermittently since 08/08/20. Patient reports that he has experienced intermittent CP, chest pressure SOB and extreme fatigue with activity. Patient has been titrating his Toprol XL for HR and is currently taking Toprol XL 75 mg BID . He reports he has taken diltiazem 30 mg prn and it has helped slow his HR. Transmission sent today with presenting EGM that shows AT in 170s with VP rhythm. ED precautions given for CP, chest pressure or SOB at rest or increasing with activity.

## 2020-08-11 NOTE — Telephone Encounter (Signed)
Called and spoke with patient, he is agreeable to appt 08/12/20 at 2 pm with Roderic Palau, NP.

## 2020-08-12 ENCOUNTER — Encounter (HOSPITAL_COMMUNITY): Payer: Self-pay | Admitting: Nurse Practitioner

## 2020-08-12 ENCOUNTER — Ambulatory Visit (HOSPITAL_COMMUNITY)
Admission: RE | Admit: 2020-08-12 | Discharge: 2020-08-12 | Disposition: A | Discharge status: Home / Self Care | Payer: Medicare Other | Source: Ambulatory Visit | Attending: Nurse Practitioner | Admitting: Nurse Practitioner

## 2020-08-12 ENCOUNTER — Other Ambulatory Visit: Payer: Self-pay

## 2020-08-12 VITALS — BP 150/96 | HR 80 | Ht 73.0 in | Wt 239.2 lb

## 2020-08-12 DIAGNOSIS — D6869 Other thrombophilia: Secondary | ICD-10-CM | POA: Diagnosis not present

## 2020-08-12 DIAGNOSIS — I4892 Unspecified atrial flutter: Secondary | ICD-10-CM | POA: Diagnosis not present

## 2020-08-12 DIAGNOSIS — Z950 Presence of cardiac pacemaker: Secondary | ICD-10-CM | POA: Insufficient documentation

## 2020-08-12 DIAGNOSIS — Z955 Presence of coronary angioplasty implant and graft: Secondary | ICD-10-CM | POA: Insufficient documentation

## 2020-08-12 DIAGNOSIS — I471 Supraventricular tachycardia: Secondary | ICD-10-CM | POA: Diagnosis not present

## 2020-08-12 DIAGNOSIS — I951 Orthostatic hypotension: Secondary | ICD-10-CM | POA: Insufficient documentation

## 2020-08-12 DIAGNOSIS — I4891 Unspecified atrial fibrillation: Secondary | ICD-10-CM

## 2020-08-12 DIAGNOSIS — I2510 Atherosclerotic heart disease of native coronary artery without angina pectoris: Secondary | ICD-10-CM | POA: Insufficient documentation

## 2020-08-12 DIAGNOSIS — I10 Essential (primary) hypertension: Secondary | ICD-10-CM | POA: Diagnosis not present

## 2020-08-12 DIAGNOSIS — Z8249 Family history of ischemic heart disease and other diseases of the circulatory system: Secondary | ICD-10-CM | POA: Diagnosis not present

## 2020-08-12 DIAGNOSIS — Z952 Presence of prosthetic heart valve: Secondary | ICD-10-CM | POA: Diagnosis not present

## 2020-08-12 DIAGNOSIS — I452 Bifascicular block: Secondary | ICD-10-CM | POA: Diagnosis not present

## 2020-08-12 DIAGNOSIS — I480 Paroxysmal atrial fibrillation: Secondary | ICD-10-CM | POA: Diagnosis not present

## 2020-08-12 DIAGNOSIS — E785 Hyperlipidemia, unspecified: Secondary | ICD-10-CM | POA: Insufficient documentation

## 2020-08-12 DIAGNOSIS — Z79899 Other long term (current) drug therapy: Secondary | ICD-10-CM | POA: Insufficient documentation

## 2020-08-12 DIAGNOSIS — Z7901 Long term (current) use of anticoagulants: Secondary | ICD-10-CM | POA: Insufficient documentation

## 2020-08-12 NOTE — Progress Notes (Signed)
Primary Care Physician: [name], MD Referring Physician: Device clinic EP: Dr. Lovena Le  Cardiologist: Dr. Mickel Fuchs, MD is a 86 y.o. male with a h/o PPM, CAD, AS, S/p AVR. persistent  left atrial flutter, HTN. He was referred here for device clinic seeing some increase in  v rates.He had cut  back  metoprolol succinate from 100 mg bid to 100/50 since visit with Dr. Lovena Le in April when he was doing well. He was contacted yesterday  by device clinic and it was reported since 6/10 intermittent AT and episodes of RVR. He reported that he had cut back on his Toprol further to 75 mg bid. Since yesterday he had increased back to 100 mg bid. He also reports shortly before June 10 th, he had a very dizzy episode that he experienced leaving a restaurant. He states that he failed to snd for a short period of time before walking .  Several people assisted him out. He denies any alcohol use.   I spoke to the [employer] rep who looked at his last interrogation in detail. He did not have any arrhythmia event around the time in the restaurant. He states that he has had orthostatic BP in the past.  He has very short runs of RVR, lasting just seconds and 8-10 beat duration.  He is in atrial flutter with v rate of 80 today. In the remote past he was on amiodarone but developed ling toxicity with it and last lung function. He also was on Tikosyn at some point that did not work well for the pt and it was stopped.   Today, he denies symptoms of palpitations, chest pain, shortness of breath, orthopnea, PND, lower extremity edema, dizziness, presyncope, syncope, or neurologic sequela. The patient is tolerating medications without difficulties and is otherwise without complaint today.   Past Medical History:  Diagnosis Date   Aortic stenosis    moderate aortic stenosis   Arthritis    Benign prostatic hypertrophy    Chronic systolic heart failure (HCC)    Chronotropic incompetence  with sinus node dysfunction (HCC)    Status post Guidant dual-mode, dual-pacing, dual-sensing  pacemaker   implantation now programmed to AAI with recent generator change.   Coronary artery disease    status post multiple prior percutaneous coronary interventions, microvascular angina per Dr Olevia Perches   Diverticulitis sigmoid colon recurrent    Dysfunctional autonomic nervous system    Dyspnea    Heart murmur    History of primary hypertension    Hypercoagulable state ([place])    chronically anticoagulated with coumadin   Hyperlipidemia    Hyperthyroidism    Hypothyroidism    Dr. 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 Lia Foyer,    Prediabetes 09/21/2017   Stroke ([place])    1990   Past Surgical History:  Procedure Laterality Date   AORTIC VALVE REPLACEMENT  03/15/2011   Procedure: AORTIC VALVE REPLACEMENT (AVR);  Surgeon: Gaye Pollack, MD;  Location: [facility];  Service: Open Heart Surgery;  Laterality: N/A;   APPENDECTOMY     CARDIAC CATHETERIZATION     13   CARDIOVERSION     CARDIOVERSION  04/15/2011   Procedure: CARDIOVERSION;  Surgeon: Loralie Champagne, MD;  Location: [place];  Service: Cardiovascular;  Laterality: N/A;   CARDIOVERSION N/A 09/11/2014   Procedure: CARDIOVERSION;  Surgeon: Sueanne Margarita, MD;  Location: [facility] ENDOSCOPY;  Service: Cardiovascular;  Laterality: N/A;   CARDIOVERSION N/A 06/27/2015   Procedure: CARDIOVERSION;  Surgeon: Thayer Headings, MD;  Location: [place];  Service: Cardiovascular;  Laterality: N/A;   CARDIOVERSION N/A 07/04/2015   Procedure: CARDIOVERSION;  Surgeon: Evans Lance, MD;  Location: [place];  Service: Cardiovascular;  Laterality: N/A;   CARDIOVERSION N/A 04/13/2017   Procedure: CARDIOVERSION;  Surgeon: Sueanne Margarita, MD;  Location: [facility] ENDOSCOPY;  Service: Cardiovascular;  Laterality: N/A;   COLONOSCOPY      CORONARY STENT INTERVENTION N/A 10/15/2019   Procedure: CORONARY STENT INTERVENTION;  Surgeon: Jettie Booze, MD;  Location: [facility] CV LAB;  Service: Cardiovascular;  Laterality: N/A;   EP IMPLANTABLE DEVICE N/A 06/16/2015   Procedure: Pacemaker Implant;  Surgeon: Evans Lance, MD;  Location: [place] CV LAB;  Service: Cardiovascular;  Laterality: N/A;   hemrrhoidectomy     LEFT AND RIGHT HEART CATHETERIZATION WITH CORONARY ANGIOGRAM Bilateral 02/01/2011   Procedure: LEFT AND RIGHT HEART CATHETERIZATION WITH CORONARY ANGIOGRAM;  Surgeon: Hillary Bow, MD;  Location: [facility] CATH LAB;  Service: Cardiovascular;  Laterality: Bilateral;   LEFT HEART CATH AND CORONARY ANGIOGRAPHY N/A 10/15/2019   Procedure: LEFT HEART CATH AND CORONARY ANGIOGRAPHY;  Surgeon: Jettie Booze, MD;  Location: [place] CV LAB;  Service: Cardiovascular;  Laterality: N/A;   MAZE  03/15/2011   Procedure: MAZE;  Surgeon: Gaye Pollack, MD;  Location: [place];  Service: Open Heart Surgery;  Laterality: N/A;   PACEMAKER INSERTION  1991   Guidant PPM, most recent Generator Change by Dr Olevia Perches was 08/22/06   RIGHT/LEFT HEART CATH AND CORONARY ANGIOGRAPHY N/A 07/06/2016   Procedure: Right/Left Heart Cath and Coronary Angiography;  Surgeon: Sherren Mocha, MD;  Location: [place] CV LAB;  Service: Cardiovascular;  Laterality: N/A;   TEE WITHOUT CARDIOVERSION  04/15/2011   Procedure: TRANSESOPHAGEAL ECHOCARDIOGRAM (TEE);  Surgeon: Loralie Champagne, MD;  Location: [facility];  Service: Cardiovascular;  Laterality: N/A;   TEE WITHOUT CARDIOVERSION N/A 09/11/2014   Procedure: TRANSESOPHAGEAL ECHOCARDIOGRAM (TEE);  Surgeon: Sueanne Margarita, MD;  Location[facility] ENDOSCOPY;  Service: Cardiovascular;  Laterality: N/A;    Current Outpatient Medications  Medication Sig Dispense Refill   amoxicillin (AMOXIL) 500 MG capsule TAKE 4 CAPSULES BY MOUTH 1 HOUR PRIOR TO DENTAL APPT 20 capsule 99   apixaban (ELIQUIS) 5 MG TABS tablet  TAKE 1 TABLET BY MOUTH 2 TIMES DAILY. 180 tablet 1   bisacodyl (DULCOLAX) 5 MG EC tablet Take 5 mg by mouth as needed (constipation).      butalbital-acetaminophen-caffeine (FIORICET, ESGIC) 50-325-40 MG per tablet Take 1 tablet by mouth 2 (two) times daily as needed for headache (osteo-arthritis pain).      Cholecalciferol (VITAMIN D3) 2000 units capsule Take 1,000 Units by mouth daily.      Coenzyme Q10 100 MG capsule Take 100 mg by mouth daily.      diclofenac Sodium (VOLTAREN) 1 % GEL Apply 2 g topically 2 (two) times daily as needed (knee pain).     doxazosin (CARDURA) 4 MG tablet TAKE 1 TABLET BY MOUTH ONCE A DAY 90 tablet 2   ezetimibe (ZETIA) 10 MG tablet Take 1 tablet (10 mg total) by mouth daily. 90 tablet 3   HYDROcodone-acetaminophen (NORCO/VICODIN) 5-325 MG tablet TAKE 1 TABLET BY MOUTH 3 TIMES DAILY AS NEEDED FOR MODERATE PAIN 90 tablet 0   LORazepam (ATIVAN) 1 MG tablet TAKE 1  TABLET BY MOUTH AT BEDTIME. MAY ALSO TAKE 1/2 TABLET DAILY AS NEEDED FOR ANXIETY. 45 tablet 5   magnesium oxide (MAG-OX) 400 MG tablet Take 400 mg by mouth daily.     metoprolol succinate (TOPROL-XL) 100 MG 24 hr tablet Take one tablet by mouth in the morning and take 1/2 tablet by mouth in the evening (Patient taking differently: Take one tablet by mouth in the morning and take 1/2 tablet by mouth in the evening) 135 tablet 3   nitroGLYCERIN (NITROSTAT) 0.4 MG SL tablet PLACE 1 TABLET UNDER THE TONGUE EVERY 5 MINUTES AS NEEDED FOR CHEST PAIN. 25 tablet 5   OVER THE COUNTER MEDICATION Apply 1 patch topically daily as needed (neck and shoulder pain). Chinese pain patch     pantoprazole (PROTONIX) 40 MG tablet TAKE 1 TABLET (40 MG TOTAL) BY MOUTH DAILY. 90 tablet 3   polyethylene glycol (MIRALAX / GLYCOLAX) 17 g packet Take 17 g by mouth daily as needed for mild constipation.      potassium chloride SA (KLOR-CON) 20 MEQ tablet TAKE 1 TABLET BY MOUTH DAILY. 90 tablet 3   predniSONE (DELTASONE) 10 MG tablet Take 1  tablet (10 mg total) by mouth daily with breakfast. (Patient taking differently: Take 5 mg by mouth daily with breakfast.) 100 tablet 3   rosuvastatin (CRESTOR) 20 MG tablet TAKE 1 TABLET (20 MG TOTAL) BY MOUTH AT BEDTIME. 90 tablet 3   amLODipine (NORVASC) 5 MG tablet Take 1 tablet (5 mg total) by mouth at bedtime as needed. (Patient not taking: Reported on 08/12/2020) 90 tablet 1   hydrocortisone sodium succinate (SOLU-CORTEF) 100 MG SOLR injection INJECT 100 MG INTO THE MUSCLE AS NEEDED WHEN YOU CAN'T TAKE PREDNISONE BY MOUTH (Patient not taking: Reported on 08/12/2020) 1 each 99   No current facility-administered medications for this encounter.    Allergies  Allergen Reactions   Contrast Media [Iodinated Diagnostic Agents] Hives   Gadolinium Derivatives Hives   Metrizamide Hives    Social History   Socioeconomic History   Marital status: Married    Spouse name: Baker Janus    Number of children: 3   Years of education: Not on file   Highest education level: Not on file  Occupational History   Occupation: Retired    Comment: Physician  Tobacco Use   Smoking status: Never   Smokeless tobacco: Never  Vaping Use   Vaping Use: Never used  Substance and Sexual Activity   Alcohol use: No    Alcohol/week: 0.0 standard drinks   Drug use: No   Sexual activity: Not Currently  Other Topics Concern   Not on file  Social History Narrative   Married to [place]. Has grown children   Retired Horticulturist, commercial MD      Never smoker no alcohol      Social Determinants of Radio broadcast assistant Strain: Not on file  Food Insecurity: Not on file  Transportation Needs: Not on file  Physical Activity: Not on file  Stress: Not on file  Social Connections: Not on file  Intimate Partner Violence: Not on file    Family History  Problem Relation Age of Onset   Heart disease Brother        Twin brother has coronary disease and recent AVR for AS   CAD Brother    Atrial fibrillation Brother     Sarcoidosis Brother    Depression Daughter    Anorexia nervosa Daughter    Hypertension Son  Anesthesia problems Neg Hx    Hypotension Neg Hx    Malignant hyperthermia Neg Hx    Pseudochol deficiency Neg Hx     ROS- All systems are reviewed and negative except as per the HPI above  Physical Exam: Vitals:   08/12/20 1403  BP: (!) 150/96  Pulse: 80  Weight: 108.5 kg  Height: 6\' 1"  (1.854 m)   Wt Readings from Last 3 Encounters:  08/12/20 108.5 kg  06/25/20 102.1 kg  06/06/20 102.5 kg    Labs: Lab Results  Component Value Date   NA 141 01/11/2020   K 4.2 01/11/2020   CL 106 01/11/2020   CO2 29 01/11/2020   GLUCOSE 114 (H) 01/11/2020   BUN 11 01/11/2020   CREATININE 1.00 01/11/2020   CALCIUM 9.3 01/11/2020   MG 2.2 10/15/2019   Lab Results  Component Value Date   INR 1.4 (H) 10/14/2019   Lab Results  Component Value Date   CHOL 134 10/16/2019   HDL 78 10/16/2019   LDLCALC 45 10/16/2019   TRIG 55 10/16/2019     GEN- The patient is well appearing, alert and oriented x 3 today.   Head- normocephalic, atraumatic Eyes-  Sclera clear, conjunctiva pink Ears- hearing intact Oropharynx- clear Neck- supple, no JVP Lymph- no cervical lymphadenopathy Lungs- Clear to ausculation bilaterally, normal work of breathing Heart- Regular rate and rhythm, no murmurs, rubs or gallops, PMI not laterally displaced GI- soft, NT, ND, + BS Extremities- no clubbing, cyanosis, or edema MS- no significant deformity or atrophy Skin- no rash or lesion Psych- euthymic mood, full affect Neuro- strength and sensation are intact  EKG-atrial flutter with variable block with v rate of 80. Qrs int 160 ms, qtc 502 ms Reviewed last interrogation with Dance movement psychotherapist and Plan:  Chronic atrial flutter  Has failed amiodarone and Tikosyn in the past He had been triturating off his BB and gotten down to 75 mg bid  Resume previous dose of metoprolol succinate 100 mg bid   Continue remote  monitoring   2. [id number]  score of at least 4 Continue eliquis 5 mg bid   3. Orthostatic hypotension Change position carefully  Watch to see if increase in BB worsens dizzy spells, is so will need Dr. Tanna Furry input as he has few drug options to treat arrhythmia's   F/u with Dr. Vallery Ridge. Lovena Le as scheduled   Geroge Baseman. Clinten Howk, [facility] [address] [place], [place] [address] [phone]

## 2020-08-18 ENCOUNTER — Other Ambulatory Visit (HOSPITAL_COMMUNITY): Payer: Self-pay

## 2020-08-20 ENCOUNTER — Other Ambulatory Visit: Payer: Self-pay

## 2020-08-20 ENCOUNTER — Encounter: Payer: Self-pay | Admitting: Rheumatology

## 2020-08-20 ENCOUNTER — Ambulatory Visit (INDEPENDENT_AMBULATORY_CARE_PROVIDER_SITE_OTHER): Payer: Medicare Other | Admitting: Rheumatology

## 2020-08-20 VITALS — BP 150/91 | HR 75 | Ht 73.0 in | Wt 230.0 lb

## 2020-08-20 DIAGNOSIS — M19041 Primary osteoarthritis, right hand: Secondary | ICD-10-CM | POA: Diagnosis not present

## 2020-08-20 DIAGNOSIS — I25118 Atherosclerotic heart disease of native coronary artery with other forms of angina pectoris: Secondary | ICD-10-CM

## 2020-08-20 DIAGNOSIS — Z5181 Encounter for therapeutic drug level monitoring: Secondary | ICD-10-CM | POA: Diagnosis not present

## 2020-08-20 DIAGNOSIS — Z8719 Personal history of other diseases of the digestive system: Secondary | ICD-10-CM

## 2020-08-20 DIAGNOSIS — G8929 Other chronic pain: Secondary | ICD-10-CM | POA: Diagnosis not present

## 2020-08-20 DIAGNOSIS — I480 Paroxysmal atrial fibrillation: Secondary | ICD-10-CM

## 2020-08-20 DIAGNOSIS — F3289 Other specified depressive episodes: Secondary | ICD-10-CM

## 2020-08-20 DIAGNOSIS — D6859 Other primary thrombophilia: Secondary | ICD-10-CM | POA: Diagnosis not present

## 2020-08-20 DIAGNOSIS — M7918 Myalgia, other site: Secondary | ICD-10-CM | POA: Diagnosis not present

## 2020-08-20 DIAGNOSIS — Z952 Presence of prosthetic heart valve: Secondary | ICD-10-CM

## 2020-08-20 DIAGNOSIS — M19011 Primary osteoarthritis, right shoulder: Secondary | ICD-10-CM | POA: Diagnosis not present

## 2020-08-20 DIAGNOSIS — M7061 Trochanteric bursitis, right hip: Secondary | ICD-10-CM | POA: Diagnosis not present

## 2020-08-20 DIAGNOSIS — I25119 Atherosclerotic heart disease of native coronary artery with unspecified angina pectoris: Secondary | ICD-10-CM

## 2020-08-20 DIAGNOSIS — Z950 Presence of cardiac pacemaker: Secondary | ICD-10-CM

## 2020-08-20 DIAGNOSIS — R5383 Other fatigue: Secondary | ICD-10-CM | POA: Diagnosis not present

## 2020-08-20 DIAGNOSIS — M5136 Other intervertebral disc degeneration, lumbar region: Secondary | ICD-10-CM

## 2020-08-20 DIAGNOSIS — M5030 Other cervical disc degeneration, unspecified cervical region: Secondary | ICD-10-CM | POA: Diagnosis not present

## 2020-08-20 DIAGNOSIS — M19042 Primary osteoarthritis, left hand: Secondary | ICD-10-CM

## 2020-08-20 DIAGNOSIS — Z8673 Personal history of transient ischemic attack (TIA), and cerebral infarction without residual deficits: Secondary | ICD-10-CM

## 2020-08-20 DIAGNOSIS — D472 Monoclonal gammopathy: Secondary | ICD-10-CM

## 2020-08-20 DIAGNOSIS — E2740 Unspecified adrenocortical insufficiency: Secondary | ICD-10-CM

## 2020-08-20 DIAGNOSIS — M19012 Primary osteoarthritis, left shoulder: Secondary | ICD-10-CM

## 2020-08-26 ENCOUNTER — Inpatient Hospital Stay: Payer: Medicare Other

## 2020-08-26 ENCOUNTER — Inpatient Hospital Stay: Payer: Medicare Other | Attending: Oncology | Admitting: Oncology

## 2020-08-26 ENCOUNTER — Other Ambulatory Visit: Payer: Self-pay

## 2020-08-26 VITALS — BP 164/98 | HR 81 | Temp 98.1°F | Resp 20 | Ht 73.0 in | Wt 236.1 lb

## 2020-08-26 DIAGNOSIS — I4891 Unspecified atrial fibrillation: Secondary | ICD-10-CM | POA: Insufficient documentation

## 2020-08-26 DIAGNOSIS — I10 Essential (primary) hypertension: Secondary | ICD-10-CM

## 2020-08-26 DIAGNOSIS — D649 Anemia, unspecified: Secondary | ICD-10-CM | POA: Diagnosis not present

## 2020-08-26 DIAGNOSIS — I252 Old myocardial infarction: Secondary | ICD-10-CM | POA: Diagnosis not present

## 2020-08-26 DIAGNOSIS — Z8673 Personal history of transient ischemic attack (TIA), and cerebral infarction without residual deficits: Secondary | ICD-10-CM | POA: Insufficient documentation

## 2020-08-26 DIAGNOSIS — N400 Enlarged prostate without lower urinary tract symptoms: Secondary | ICD-10-CM | POA: Insufficient documentation

## 2020-08-26 DIAGNOSIS — D472 Monoclonal gammopathy: Secondary | ICD-10-CM

## 2020-08-26 DIAGNOSIS — R768 Other specified abnormal immunological findings in serum: Secondary | ICD-10-CM | POA: Diagnosis not present

## 2020-08-26 LAB — CBC WITH DIFFERENTIAL (CANCER CENTER ONLY)
Abs Immature Granulocytes: 0.03 10*3/uL (ref 0.00–0.07)
Basophils Absolute: 0 10*3/uL (ref 0.0–0.1)
Basophils Relative: 0 %
Eosinophils Absolute: 0.1 10*3/uL (ref 0.0–0.5)
Eosinophils Relative: 2 %
HCT: 36.9 % — ABNORMAL LOW (ref 39.0–52.0)
Hemoglobin: 11.7 g/dL — ABNORMAL LOW (ref 13.0–17.0)
Immature Granulocytes: 0 %
Lymphocytes Relative: 9 %
Lymphs Abs: 0.6 10*3/uL — ABNORMAL LOW (ref 0.7–4.0)
MCH: 29 pg (ref 26.0–34.0)
MCHC: 31.7 g/dL (ref 30.0–36.0)
MCV: 91.3 fL (ref 80.0–100.0)
Monocytes Absolute: 0.4 10*3/uL (ref 0.1–1.0)
Monocytes Relative: 5 %
Neutro Abs: 5.9 10*3/uL (ref 1.7–7.7)
Neutrophils Relative %: 84 %
Platelet Count: 203 10*3/uL (ref 150–400)
RBC: 4.04 MIL/uL — ABNORMAL LOW (ref 4.22–5.81)
RDW: 14.7 % (ref 11.5–15.5)
WBC Count: 7 10*3/uL (ref 4.0–10.5)
nRBC: 0 % (ref 0.0–0.2)

## 2020-08-26 LAB — CMP (CANCER CENTER ONLY)
ALT: 19 U/L (ref 0–44)
AST: 22 U/L (ref 15–41)
Albumin: 4.3 g/dL (ref 3.5–5.0)
Alkaline Phosphatase: 93 U/L (ref 38–126)
Anion gap: 7 (ref 5–15)
BUN: 13 mg/dL (ref 8–23)
CO2: 29 mmol/L (ref 22–32)
Calcium: 9.5 mg/dL (ref 8.9–10.3)
Chloride: 102 mmol/L (ref 98–111)
Creatinine: 0.87 mg/dL (ref 0.61–1.24)
GFR, Estimated: >60 mL/min (ref >60)
Glucose, Bld: 102 mg/dL — ABNORMAL HIGH (ref 70–99)
Potassium: 4.3 mmol/L (ref 3.5–5.1)
Sodium: 138 mmol/L (ref 135–145)
Total Bilirubin: 0.5 mg/dL (ref 0.3–1.2)
Total Protein: 7.7 g/dL (ref 6.5–8.1)

## 2020-08-26 LAB — LIPID PANEL
Cholesterol: 137 mg/dL (ref 0–200)
HDL: 75 mg/dL (ref >40)
LDL Cholesterol: 49 mg/dL (ref 0–99)
Total CHOL/HDL Ratio: 1.8 RATIO
Triglycerides: 66 mg/dL (ref <150)
VLDL: 13 mg/dL (ref 0–40)

## 2020-08-26 NOTE — Progress Notes (Signed)
Remote pacemaker transmission.   

## 2020-08-26 NOTE — Progress Notes (Signed)
  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  He is followed by Dr. Inez Pilgrim for osteoarthritis and myofascial pain.  He was admitted in August 2021 with atrial fibrillation/RVR and an NSTEMI.  He reports exertional dyspnea.  No fever or night sweats.  Good appetite.  He has intermittent headaches.  Objective:  Vital signs in last 24 hours:  Blood pressure (!) 164/98, pulse 81, temperature 98.1 F (36.7 C), temperature source Oral, resp. rate 20, height $RemoveBe'6\' 1"'qVhcmesRt$  (1.854 m), weight 236 lb 1.6 oz (107.1 kg), SpO2 (!) 87 %.   Lymphatics: No cervical, supraclavicular, axillary, or inguinal nodes Resp: Lungs clear bilaterally Cardio: Irregular GI: No hepatosplenomegaly Vascular: No leg edema     Lab Results:  Lab Results  Component Value Date   WBC 6.7 03/19/2020   HGB 11.2 (L) 03/19/2020   HCT 35.2 (L) 03/19/2020   MCV 89.8 03/19/2020   PLT 203 03/19/2020   NEUTROABS 5.4 03/19/2020    CMP  Lab Results  Component Value Date   NA 141 01/11/2020   K 4.2 01/11/2020   CL 106 01/11/2020   CO2 29 01/11/2020   GLUCOSE 114 (H) 01/11/2020   BUN 11 01/11/2020   CREATININE 1.00 01/11/2020   CALCIUM 9.3 01/11/2020   PROT 7.3 01/11/2020   ALBUMIN 3.9 01/11/2020   AST 26 01/11/2020   ALT 20 01/11/2020   ALKPHOS 101 01/11/2020   BILITOT 0.6 01/11/2020   GFRNONAA >60 01/11/2020   GFRAA 92 11/26/2019   Medications: I have reviewed the patient's current medications.   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  This is most likely monoclonal gammopathy of  unknown significance.  There is no clinical evidence for progression to multiple myeloma or another lymphoproliferative disorder.  We will follow-up on the IgM level, CBC, chemistry panel, and serum light chains from today.  He will return for an office and lab visit in 1 year.  He will contact me in the interim for new symptoms.  He has chronic mild anemia, likely related to anemia of chronic disease, bleeding while on apixaban, or the serum monoclonal protein.  He will call for symptoms of anemia.  Betsy Coder, MD  08/26/2020  11:57 AM

## 2020-08-27 LAB — PROTEIN ELECTROPHORESIS, SERUM
A/G Ratio: 1.2 (ref 0.7–1.7)
Albumin ELP: 4 g/dL (ref 2.9–4.4)
Alpha-1-Globulin: 0.2 g/dL (ref 0.0–0.4)
Alpha-2-Globulin: 0.7 g/dL (ref 0.4–1.0)
Beta Globulin: 0.8 g/dL (ref 0.7–1.3)
Gamma Globulin: 1.5 g/dL (ref 0.4–1.8)
Globulin, Total: 3.3 g/dL (ref 2.2–3.9)
M-Spike, %: 0.7 g/dL — ABNORMAL HIGH
Total Protein ELP: 7.3 g/dL (ref 6.0–8.5)

## 2020-08-27 LAB — KAPPA/LAMBDA LIGHT CHAINS
Kappa free light chain: 20.9 mg/L — ABNORMAL HIGH (ref 3.3–19.4)
Kappa, lambda light chain ratio: 0.65 (ref 0.26–1.65)
Lambda free light chains: 32.1 mg/L — ABNORMAL HIGH (ref 5.7–26.3)

## 2020-08-28 ENCOUNTER — Other Ambulatory Visit (HOSPITAL_COMMUNITY): Payer: Self-pay

## 2020-08-28 ENCOUNTER — Encounter: Payer: Self-pay | Admitting: Oncology

## 2020-08-28 ENCOUNTER — Other Ambulatory Visit: Payer: Self-pay | Admitting: Rheumatology

## 2020-08-28 LAB — IMMUNOFIXATION ELECTROPHORESIS
IgA: 129 mg/dL (ref 61–437)
IgG (Immunoglobin G), Serum: 757 mg/dL (ref 603–1613)
IgM (Immunoglobulin M), Srm: 980 mg/dL — ABNORMAL HIGH (ref 15–143)
Total Protein ELP: 7.2 g/dL (ref 6.0–8.5)

## 2020-08-28 MED ORDER — HYDROCODONE-ACETAMINOPHEN 5-325 MG PO TABS
ORAL_TABLET | ORAL | 0 refills | Status: DC
  Filled 2020-08-28: qty 90, 30d supply, fill #0

## 2020-08-29 ENCOUNTER — Other Ambulatory Visit: Payer: Self-pay | Admitting: Internal Medicine

## 2020-08-29 ENCOUNTER — Other Ambulatory Visit (HOSPITAL_COMMUNITY): Payer: Self-pay

## 2020-08-29 DIAGNOSIS — I4891 Unspecified atrial fibrillation: Secondary | ICD-10-CM

## 2020-08-29 LAB — DRUG MONITOR, PANEL 5, W/CONF, URINE
Amobarbital: NEGATIVE ng/mL (ref <100)
Amphetamines: NEGATIVE ng/mL (ref <500)
Barbiturates: POSITIVE ng/mL — AB (ref <300)
Benzodiazepines: NEGATIVE ng/mL (ref <100)
Butalbital: 1978 ng/mL — ABNORMAL HIGH (ref <100)
Cocaine Metabolite: NEGATIVE ng/mL (ref <150)
Codeine: NEGATIVE ng/mL (ref <50)
Creatinine: 28.7 mg/dL (ref >=20.0)
Hydrocodone: 64 ng/mL — ABNORMAL HIGH (ref <50)
Hydromorphone: 115 ng/mL — ABNORMAL HIGH (ref <50)
Marijuana Metabolite: NEGATIVE ng/mL (ref <20)
Methadone Metabolite: NEGATIVE ng/mL (ref <100)
Morphine: NEGATIVE ng/mL (ref <50)
Norhydrocodone: 281 ng/mL — ABNORMAL HIGH (ref <50)
Opiates: POSITIVE ng/mL — AB (ref <100)
Oxidant: NEGATIVE ug/mL (ref <200)
Oxycodone: NEGATIVE ng/mL (ref <100)
Pentobarbital: NEGATIVE ng/mL (ref <100)
Phenobarbital: NEGATIVE ng/mL (ref <100)
Secobarbital: NEGATIVE ng/mL (ref <100)
pH: 7.1 (ref 4.5–9.0)

## 2020-08-29 LAB — DM TEMPLATE

## 2020-08-29 MED ORDER — METOPROLOL SUCCINATE ER 100 MG PO TB24
ORAL_TABLET | ORAL | 3 refills | Status: DC
  Filled 2020-11-06: qty 180, 90d supply, fill #0

## 2020-08-29 NOTE — Progress Notes (Signed)
UDS is positive for barbiturates, and hydrocodone which is consistent with the treatment.

## 2020-09-01 ENCOUNTER — Telehealth: Payer: Self-pay | Admitting: Adult Health

## 2020-09-01 NOTE — Telephone Encounter (Signed)
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 He is having mild symptoms, mostly nasal, for the past two days. He is requesting paxlovid. BMP reviewed, renal function is ok. He is on a statin which I asked him to hold. Both pharmacies that he asked me to use are closed as it is July 4th. I called and left a message with the pharmacy at [facility] on [address] to call me for a med review prior to authorizing prescription of paxlovid.

## 2020-09-02 ENCOUNTER — Other Ambulatory Visit (HOSPITAL_COMMUNITY): Payer: Self-pay

## 2020-09-02 ENCOUNTER — Other Ambulatory Visit (HOSPITAL_BASED_OUTPATIENT_CLINIC_OR_DEPARTMENT_OTHER): Payer: Self-pay

## 2020-09-02 ENCOUNTER — Telehealth: Payer: Self-pay | Admitting: Adult Health

## 2020-09-02 DIAGNOSIS — U071 COVID-19: Secondary | ICD-10-CM

## 2020-09-02 MED ORDER — PAXLOVID 20 X 150 MG & 10 X 100MG PO TBPK
3.0000 | ORAL_TABLET | Freq: Two times a day (BID) | ORAL | 0 refills | Status: AC
  Filled 2020-09-02: qty 30, 5d supply, fill #0

## 2020-09-02 NOTE — Telephone Encounter (Signed)
Discussed with pharmacist at [facility] and they indicated that we need to hold the zetia and zocor while on Paxlovid and reduce to eliquis by 50% for the duration of therapy as well. These directions will be communicated to the responsible party at pick up and written down for the pt

## 2020-09-04 ENCOUNTER — Telehealth: Payer: Self-pay | Admitting: *Deleted

## 2020-09-04 NOTE — Telephone Encounter (Signed)
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 Also went over to monitor for sob and or worsening cough or fever. If this happens we can order a CXR.  His wife wanted a covid swab and so I asked the clinic nurse to go to their home and do this for exposure. They expressed understanding and appreciation.

## 2020-09-04 NOTE — Telephone Encounter (Signed)
Patient called and left message on Clinical intake at 4:59 09/03/2020 stating that he has been expecting a call from Dr. Lyndel Safe all day and hasn't received one.  Stated that she was suppose to call him regarding him testing Positive for Covid.   Requesting Dr. Lyndel Safe to call him at [phone].

## 2020-09-04 NOTE — Telephone Encounter (Signed)
Royal Hawthorn, NP  You 49 minutes ago (9:15 AM)   I called and left a message on his voice mail. I let him know that his isolation time should be 5 days of isolation from symptom onset and then he can come off isolation and should do 5 days of masking if he goes out in public or is around others. This applies as long as his symptoms are improving and he is afebrile. I will try to call him again in a little bit as well.

## 2020-09-07 ENCOUNTER — Encounter: Payer: Self-pay | Admitting: Cardiology

## 2020-09-08 DIAGNOSIS — E7800 Pure hypercholesterolemia, unspecified: Secondary | ICD-10-CM | POA: Insufficient documentation

## 2020-09-10 ENCOUNTER — Encounter: Payer: Self-pay | Admitting: *Deleted

## 2020-09-12 ENCOUNTER — Ambulatory Visit: Payer: Medicare Other | Admitting: Internal Medicine

## 2020-09-15 ENCOUNTER — Ambulatory Visit (INDEPENDENT_AMBULATORY_CARE_PROVIDER_SITE_OTHER): Payer: Medicare Other | Admitting: Cardiology

## 2020-09-15 ENCOUNTER — Other Ambulatory Visit: Payer: Self-pay

## 2020-09-15 ENCOUNTER — Encounter (HOSPITAL_BASED_OUTPATIENT_CLINIC_OR_DEPARTMENT_OTHER): Payer: Self-pay | Admitting: Cardiology

## 2020-09-15 VITALS — BP 118/68 | HR 74 | Ht 73.0 in | Wt 225.0 lb

## 2020-09-15 DIAGNOSIS — D6869 Other thrombophilia: Secondary | ICD-10-CM | POA: Diagnosis not present

## 2020-09-15 DIAGNOSIS — I25118 Atherosclerotic heart disease of native coronary artery with other forms of angina pectoris: Secondary | ICD-10-CM

## 2020-09-15 DIAGNOSIS — Z952 Presence of prosthetic heart valve: Secondary | ICD-10-CM

## 2020-09-15 DIAGNOSIS — I4819 Other persistent atrial fibrillation: Secondary | ICD-10-CM

## 2020-09-15 DIAGNOSIS — I471 Supraventricular tachycardia: Secondary | ICD-10-CM

## 2020-09-15 DIAGNOSIS — R5382 Chronic fatigue, unspecified: Secondary | ICD-10-CM | POA: Diagnosis not present

## 2020-09-15 DIAGNOSIS — I25119 Atherosclerotic heart disease of native coronary artery with unspecified angina pectoris: Secondary | ICD-10-CM

## 2020-09-15 NOTE — Progress Notes (Signed)
Cardiology Office Note:    Date:  09/15/2020   ID:  Kevin Buff, [name], DOB 1934/09/26, MRN [id number]  PCP:  [name], [name]  Cardiologist:  Buford Dresser, [name]  Referring [name]: Gayland Curry, DO   CC: follow up  History of Present Illness:    Kevin Buff, [name] is a 86 y.o. male with a hx of atypical atrial flutter/atrial tachycardia, atrial fibrillation, prior PICA CVA, CAD with multiple prior stents, aortic valve disease s/p bioprosthetic AVR, orthostatic hypotension, and chronic diastolic heart failure who is seen for follow-up. He was initially seen as a new patient to me 05/16/20.  He has been followed by Dr. Burt Knack in the past, last visit 03/20/20. At that visit, noted to have angina on exertion only. Noted also to have chronic dyspnea, labile BP. Also noted to have occasional irregular rhythms on his Apple watch, but he is asymptomatic. Imdur was added at that visit.  Today: He is feeling terrible overall. He feels fatigued and has no energy, and can not walk longer than 25 feet. While standing up after eating, he becomes severely lightheaded and near-syncope. However, after eating dinner in the late afternoon, his lightheadedness is not as severe. Usually he will stand for a minute before continuing to walk. Also, he continues to have headaches.   Once every 2-4 weeks he has exertional angina. Recently, his blood pressure was found to be 80/50. Typically he does not see heart rate readings above 100.  At this time, he states he has only used Imdur once.  He denies any chest pain, shortness of breath, or palpitations. Also has no lower extremity edema, orthopnea or PND.   Past Medical History:  Diagnosis Date   Aortic stenosis    moderate aortic stenosis   Arthritis    Benign prostatic hypertrophy    Chronic systolic heart failure (HCC)    Chronotropic incompetence with sinus node dysfunction (HCC)    Status post Guidant dual-mode, dual-pacing, dual-sensing   pacemaker   implantation now programmed to AAI with recent generator change.   Coronary artery disease    status post multiple prior percutaneous coronary interventions, microvascular angina per Dr Olevia Perches   Diverticulitis sigmoid colon recurrent    Dysfunctional autonomic nervous system    Dyspnea    Heart murmur    History of primary hypertension    Hypercoagulable state ([place])    chronically anticoagulated with coumadin   Hyperlipidemia    Hyperthyroidism    Hypothyroidism    Dr. 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 Lia Foyer,    Prediabetes 09/21/2017   Stroke ([place])    1990    Past Surgical History:  Procedure Laterality Date   AORTIC VALVE REPLACEMENT  03/15/2011   Procedure: AORTIC VALVE REPLACEMENT (AVR);  Surgeon: Gaye Pollack, [name];  Location: [place];  Service: Open Heart Surgery;  Laterality: N/A;   APPENDECTOMY     CARDIAC CATHETERIZATION     11   CARDIOVERSION     CARDIOVERSION  04/15/2011   Procedure: CARDIOVERSION;  Surgeon: Loralie Champagne, [name];  Location: [place];  Service: Cardiovascular;  Laterality: N/A;   CARDIOVERSION N/A 09/11/2014   Procedure: CARDIOVERSION;  Surgeon: Sueanne Margarita, [name];  Location: [place] [facility];  Service: Cardiovascular;  Laterality: N/A;   CARDIOVERSION N/A 06/27/2015   Procedure: CARDIOVERSION;  Surgeon: Wonda Cheng  Nahser, [name];  Location: [place];  Service: Cardiovascular;  Laterality: N/A;   CARDIOVERSION N/A 07/04/2015   Procedure: CARDIOVERSION;  Surgeon: Evans Lance, [name];  Location: [place];  Service: Cardiovascular;  Laterality: N/A;   CARDIOVERSION N/A 04/13/2017   Procedure: CARDIOVERSION;  Surgeon: Sueanne Margarita, [name];  Location: [facility];  Service: Cardiovascular;  Laterality: N/A;   COLONOSCOPY     CORONARY STENT INTERVENTION N/A 10/15/2019   Procedure: CORONARY STENT INTERVENTION;  Surgeon:  Jettie Booze, [name];  Location: [place] [facility];  Service: Cardiovascular;  Laterality: N/A;   EP IMPLANTABLE DEVICE N/A 06/16/2015   Procedure: Pacemaker Implant;  Surgeon: Evans Lance, [name];  Location: [place] CV LAB;  Service: Cardiovascular;  Laterality: N/A;   hemrrhoidectomy     LEFT AND RIGHT HEART CATHETERIZATION WITH CORONARY ANGIOGRAM Bilateral 02/01/2011   Procedure: LEFT AND RIGHT HEART CATHETERIZATION WITH CORONARY ANGIOGRAM;  Surgeon: Hillary Bow, [name];  Location: [facility];  Service: Cardiovascular;  Laterality: Bilateral;   LEFT HEART CATH AND CORONARY ANGIOGRAPHY N/A 10/15/2019   Procedure: LEFT HEART CATH AND CORONARY ANGIOGRAPHY;  Surgeon: Jettie Booze, [name];  Location: [place] CV LAB;  Service: Cardiovascular;  Laterality: N/A;   MAZE  03/15/2011   Procedure: MAZE;  Surgeon: Gaye Pollack, [name];  Location: [place];  Service: Open Heart Surgery;  Laterality: N/A;   PACEMAKER INSERTION  1991   Guidant PPM, most recent Generator Change by Dr Olevia Perches was 08/22/06   RIGHT/LEFT HEART CATH AND CORONARY ANGIOGRAPHY N/A 07/06/2016   Procedure: Right/Left Heart Cath and Coronary Angiography;  Surgeon: Sherren Mocha, [name];  Location: [place] CV LAB;  Service: Cardiovascular;  Laterality: N/A;   TEE WITHOUT CARDIOVERSION  04/15/2011   Procedure: TRANSESOPHAGEAL ECHOCARDIOGRAM (TEE);  Surgeon: Loralie Champagne, [name];  Location: [place];  Service: Cardiovascular;  Laterality: N/A;   TEE WITHOUT CARDIOVERSION N/A 09/11/2014   Procedure: TRANSESOPHAGEAL ECHOCARDIOGRAM (TEE);  Surgeon: Sueanne Margarita, [name];  Location: [facility];  Service: Cardiovascular;  Laterality: N/A;    Current Medications: Current Outpatient Medications on File Prior to Visit  Medication Sig   metoprolol succinate (TOPROL-XL) 100 MG 24 hr tablet Take 1 tablet by mouth twice daily   amoxicillin (AMOXIL) 500 MG capsule TAKE 4 CAPSULES BY MOUTH 1 HOUR PRIOR TO DENTAL APPT (Patient not taking:  Reported on 08/26/2020)   apixaban (ELIQUIS) 5 MG TABS tablet TAKE 1 TABLET BY MOUTH 2 TIMES DAILY.   bisacodyl (DULCOLAX) 5 MG EC tablet Take 5 mg by mouth as needed (constipation).    butalbital-acetaminophen-caffeine (FIORICET, ESGIC) 50-325-40 MG per tablet Take 1 tablet by mouth 2 (two) times daily as needed for headache (osteo-arthritis pain).    Cholecalciferol (VITAMIN D3) 2000 units capsule Take 1,000 Units by mouth daily.    Coenzyme Q10 100 MG capsule Take 100 mg by mouth daily.    diclofenac Sodium (VOLTAREN) 1 % GEL Apply 2 g topically 2 (two) times daily as needed (knee pain).   doxazosin (CARDURA) 4 MG tablet TAKE 1 TABLET BY MOUTH ONCE A DAY   ezetimibe (ZETIA) 10 MG tablet Take 1 tablet (10 mg total) by mouth daily.   HYDROcodone-acetaminophen (NORCO/VICODIN) 5-325 MG tablet TAKE 1 TABLET BY MOUTH 3 TIMES DAILY AS NEEDED FOR MODERATE PAIN   LORazepam (ATIVAN) 1 MG tablet TAKE 1 TABLET BY MOUTH AT BEDTIME. MAY ALSO TAKE 1/2 TABLET DAILY AS NEEDED FOR ANXIETY.   magnesium oxide (MAG-OX) 400 MG tablet Take 400  mg by mouth daily.   nitroGLYCERIN (NITROSTAT) 0.4 MG SL tablet PLACE 1 TABLET UNDER THE TONGUE EVERY 5 MINUTES AS NEEDED FOR CHEST PAIN.   OVER THE COUNTER MEDICATION Apply 1 patch topically daily as needed (neck and shoulder pain). Chinese pain patch   pantoprazole (PROTONIX) 40 MG tablet TAKE 1 TABLET (40 MG TOTAL) BY MOUTH DAILY.   polyethylene glycol (MIRALAX / GLYCOLAX) 17 g packet Take 17 g by mouth daily as needed for mild constipation.    potassium chloride SA (KLOR-CON) 20 MEQ tablet TAKE 1 TABLET BY MOUTH DAILY.   predniSONE (DELTASONE) 10 MG tablet Take 1 tablet (10 mg total) by mouth daily with breakfast. (Patient taking differently: Take 5 mg by mouth daily with breakfast.)   rosuvastatin (CRESTOR) 20 MG tablet TAKE 1 TABLET (20 MG TOTAL) BY MOUTH AT BEDTIME.   [DISCONTINUED] diltiazem (CARDIZEM) 30 MG tablet Take 1 tablet (30 mg total) by mouth every 6 (six) hours  as needed (for HRs greater than 150). (Patient not taking: Reported on 03/19/2020)   [DISCONTINUED] isosorbide mononitrate (IMDUR) 30 MG 24 hr tablet Take 0.5 tablets (15 mg total) by mouth daily.   No current facility-administered medications on file prior to visit.     Allergies:   Contrast media [iodinated diagnostic agents], Gadolinium derivatives, and Metrizamide   Social History   Tobacco Use   Smoking status: Never   Smokeless tobacco: Never  Vaping Use   Vaping Use: Never used  Substance Use Topics   Alcohol use: No    Alcohol/week: 0.0 standard drinks   Drug use: No    Family History: family history includes Anorexia nervosa in his daughter; Atrial fibrillation in his brother; CAD in his brother; Depression in his daughter; Heart disease in his brother; Hypertension in his son; Sarcoidosis in his brother. There is no history of Anesthesia problems, Hypotension, Malignant hyperthermia, or Pseudochol deficiency.  ROS:   Please see the history of present illness. (+) Fatigue (+) Lightheadedness (+) Near-syncope (+) Headaches All other systems are reviewed and negative.     EKGs/Labs/Other Studies Reviewed:    The following studies were reviewed today:  Echo 01-18-2020:   1. Left ventricular ejection fraction, by estimation, is 45 to 50%. The  left ventricle has mildly decreased function. The left ventricle  demonstrates regional wall motion abnormalities (see scoring  diagram/findings for description). There is mild left  ventricular hypertrophy. Left ventricular diastolic function could not be  evaluated. There is severe akinesis of the left ventricular, basal-mid  inferior wall suggestive of scar.   2. Left atrial size was moderately dilated.   3. The mitral valve is abnormal. Mild mitral valve regurgitation.   4. The aortic valve has been repaired/replaced with a bioprosthetic valve  (2013). Aortic valve area, by VTI measures 1.27 cm. Aortic valve mean   gradient measures 9.0 mmHg. Aortic valve Vmax measures 2.05 m/s. DI of  0.33.   5. Aortic dilatation noted. There is mild dilatation of the ascending  aorta, measuring 41 mm.   6. Right atrial size was mildly dilated.   7. Right ventricular systolic function is low normal. The right  ventricular size is normal. There is normal pulmonary artery systolic  pressure.   8. The inferior vena cava is dilated in size with >50% respiratory  variability, suggesting right atrial pressure of 8 mmHg.   Comparison(s): No significant change from prior study. 10/15/2019: LVEF  45-50%, basal to mid inferior hypokinesis, bioprosthetic AVR - mean  gradient  7 mmHg.    Cardiac Cath 10-15-2019: Conclusion     Prox LAD to Mid LAD lesion is 40% stenosed. Prox RCA lesion is 30% stenosed. Mid RCA lesion is 40% stenosed. Previously placed Mid Cx to Dist Cx stent (unknown type) is widely patent. Dist RCA lesion is 75% stenosed. This was new compared to prior cath in 2018. A drug-eluting stent was successfully placed using a SYNERGY XD 2.75X16. THis was dilated to 3.0 mm and postdilated to 3.4 mm. Post intervention, there is a 0% residual stenosis. There is moderate left ventricular systolic dysfunction. LV end diastolic pressure is normal. The left ventricular ejection fraction is 35-45% by visual estimate. Global hypokinesis.   No aspirin given Eliquis and Plavix.  Could use Plavix longer if no bleeding issues, but bare minimum would be one month.     Continue aggressive secondary prevention and management of his tachycardia.    Diagnostic Dominance: Right   Left Main  Vessel is angiographically normal.  Left Anterior Descending  Prox LAD to Mid LAD lesion is 40% stenosed. There is mild diffuse proximal LAD stenosis unchanged from previous study. The LAD wraps around the LV apex  Left Circumflex  Previously placed Mid Cx to Dist Cx stent (unknown type) is widely patent.  Right Coronary Artery  There  is mild diffuse disease throughout the vessel. Large, dominant vessel. There are diffuse irregularities with mild stenoses in the proximal and mid vessel. The distal vessel is widely patent. The PDA and PLA branches are patent.  Prox RCA lesion is 30% stenosed. The lesion is calcified.  Mid RCA lesion is 40% stenosed. The lesion is calcified.  Dist RCA lesion is 75% stenosed.    Intervention     Dist RCA lesion  Stent  CATH LAUNCHER 6FR JR4 guide catheter was inserted. Lesion crossed with guidewire using a WIRE ASAHI PROWATER 180CM. Pre-stent angioplasty was performed using a BALLOON SAPPHIRE 2.5X12. A drug-eluting stent was successfully placed using a SYNERGY XD 2.75X16. Stent strut is well apposed. Post-stent angioplasty was performed using a BALLOON SAPPHIRE [place] 3.25X12.  Post-Intervention Lesion Assessment  The intervention was successful. Pre-interventional TIMI flow is 3. Post-intervention TIMI flow is 3. No complications occurred at this lesion.  There is a 0% residual stenosis post intervention.     EKG:  EKG is personally reviewed.    09/15/2020: atrial tachycardia/atypical atrial flutter at 74 bpm, RBBB, LAFB 05/16/2020: atrial tachycardia with a ventricular rate of 70 bpm  Recent Labs: 10/14/2019: TSH 2.231 10/15/2019: Magnesium 2.2 08/26/2020: ALT 19; BUN 13; Creatinine 0.87; Hemoglobin 11.7; Platelet Count 203; Potassium 4.3; Sodium 138  Recent Lipid Panel    Component Value Date/Time   CHOL 137 08/26/2020 1234   CHOL 158 09/02/2017 1006   TRIG 66 08/26/2020 1234   HDL 75 08/26/2020 1234   HDL 72 09/02/2017 1006   CHOLHDL 1.8 08/26/2020 1234   VLDL 13 08/26/2020 1234   LDLCALC 49 08/26/2020 1234   LDLCALC 68 09/02/2017 1006   LDLDIRECT 117.9 11/16/2010 1355    Physical Exam:    VS:  BP 118/68   Pulse 74   Ht 6\' 1"  (1.854 m)   Wt 225 lb (102.1 kg)   SpO2 96%   BMI 29.69 kg/m     Wt Readings from Last 3 Encounters:  08/26/20 236 lb 1.6 oz (107.1 kg)  08/20/20  230 lb (104.3 kg)  08/12/20 239 lb 3.2 oz (108.5 kg)    GEN: Well nourished, well developed in  no acute distress HEENT: Normal, moist mucous membranes NECK: No JVD CARDIAC: regular rhythm, normal S1 and S2, no rubs or gallops. 1/6 systolic murmur. VASCULAR: Radial and DP pulses 2+ bilaterally. No carotid bruits RESPIRATORY:  Clear to auscultation without rales, wheezing or rhonchi  ABDOMEN: Soft, non-tender, non-distended MUSCULOSKELETAL:  Ambulates independently SKIN: Warm and dry, no edema NEUROLOGIC:  Alert and oriented x 3. No focal neuro deficits noted. PSYCHIATRIC:  Normal affect    ASSESSMENT:    1. Chronic fatigue   2. Persistent atrial fibrillation ([place])   3. Secondary hypercoagulable state ([place])   4. Atrial tachycardia ([place])   5. Coronary artery disease of native artery of native heart with stable angina pectoris ([place])   6. S/P AVR (aortic valve replacement)     PLAN:    Fatigue, chronic: -unclear etiology. Symptoms did not improve after PCI in 2021.  -reviewed labs. Last LFTs nl, BMET stable. Has chronic stable anemia, last labs within his normal range. Follows with Dr. Ammie Dalton in heme/onc and Dr. Estanislado Pandy in rheumatology -we discussed multiple potential etiologies. I have a low suspicion that this is related to his CAD, given that symptoms did not improve after prior intervention, and he feels poorly constantly. However, we did discuss repeat cath if there is no improvement -see prior extensive conversation re: atrial tach. I suspect this is one of the drivers of his symptoms. He follows closely with Dr. Lovena Le on this.   Low normal EF (45-50% with akinetic basal-mid inferior wall 12/2019), suspect ischemic cardiomyopathy (given focal WMA) vs arrythmia mediated -similar to echo from 10/15/19, but echo 02/13/2019 read as 55-60% -cath 10/15/19 reported visual EF as 35-45% with global hypokinesis -has order in for repeat echo -continue metoprolol succinate 100 mg BiD -no  BP room for ARB today. History of orthostatic hypotension, need to be cautious about antihypertensive agents  Atrial tachycardia: history of atrial fib, atrial flutter, and atrial tachycardia S/P Dual Chamber PPM -has had extensive evaluation at [facility], [facility], [facility], [facility] etc without complete resolution -follows closely with Dr. Lovena Le. I will reach out to him and discuss options  History of CVA: -continue apixaban, rosuvastatin  Bioprosthetic AVR: -he discontinue aspirin, is on apixaban alone -echo reviewed as above  CAD with stable angina: -has not tolerated imdur for angina -on maximum dose of metoprolol succinate -reviewed cath from 09/2019. Had PCI to RCA, did not improve fatigue -did not have improvement on ranolazine in the past  Hypercholesterolemia: -continue rosuvastatin, ezetimibe -Last LDL 45, goal <70. Did discuss data that there is benefit to lowering LDL as low as medication permits. He is not having known medication side effects at this time.  Cardiac risk counseling and prevention recommendations: -recommend heart healthy/Mediterranean diet, with whole grains, fruits, vegetable, fish, lean meats, nuts, and olive oil. Limit salt. -recommend moderate walking, 3-5 times/week for 30-50 minutes each session. Aim for at least 150 minutes.week. Goal should be pace of 3 miles/hours, or walking 1.5 miles in 30 minutes -recommend avoidance of tobacco products. Avoid excess alcohol.  Plan for follow up: 3 months, will contact me if symptoms worsen  Total time of encounter: 41 minutes total time of encounter, including 41 minutes spent in face-to-face patient care. This time includes coordination of care and counseling regarding symptoms and options for management. Remainder of non-face-to-face time involved reviewing chart documents/testing relevant to the patient encounter and documentation in the medical record.  Buford Dresser, MD, PhD, [place]  Medication Adjustments/Labs and Tests Ordered: Current medicines are reviewed at length with the patient today.  Concerns regarding medicines are outlined above.  Orders Placed This Encounter  Procedures   EKG 12-Lead    No orders of the defined types were placed in this encounter.   Patient Instructions  Medication Instructions:  Your Physician recommend you continue on your current medication as directed.    *If you need a refill on your cardiac medications before your next appointment, please call your pharmacy*   Lab Work: None ordered    Testing/Procedures: None ordered    Follow-Up: At [facility], you and your health needs are our priority.  As part of our continuing mission to provide you with exceptional heart care, we have created designated Provider Care Teams.  These Care Teams include your primary Cardiologist (physician) and Advanced Practice Providers (APPs -  Physician Assistants and Nurse Practitioners) who all work together to provide you with the care you need, when you need it.  We recommend signing up for the patient portal called "MyChart".  Sign up information is provided on this After Visit Summary.  MyChart is used to connect with patients for Virtual Visits (Telemedicine).  Patients are able to view lab/test results, encounter notes, upcoming appointments, etc.  Non-urgent messages can be sent to your provider as well.   To learn more about what you can do with MyChart, go to [email or web address].    Your next appointment:   3 month(s)  The format for your next appointment:   In Person  Provider:   Buford Dresser, [name]    [facility] St[facility],acting as a scribe for Buford Dresser, [name].,have documented all relevant documentation on the behalf of Buford Dresser, [name],as directed by  Buford Dresser, [name] while in the presence of Buford Dresser, [name].  I, Buford Dresser, [name], have reviewed all documentation for this  visit. The documentation on 10/25/20 for the exam, diagnosis, procedures, and orders are all accurate and complete.   Signed, Buford Dresser, MD PhD 09/15/2020     [place]

## 2020-09-15 NOTE — Patient Instructions (Signed)
Medication Instructions:  Your Physician recommend you continue on your current medication as directed.    *If you need a refill on your cardiac medications before your next appointment, please call your pharmacy*   Lab Work: None ordered   Testing/Procedures: None ordered    Follow-Up: At [facility], you and your health needs are our priority.  As part of our continuing mission to provide you with exceptional heart care, we have created designated Provider Care Teams.  These Care Teams include your primary Cardiologist (physician) and Advanced Practice Providers (APPs -  Physician Assistants and Nurse Practitioners) who all work together to provide you with the care you need, when you need it.  We recommend signing up for the patient portal called "MyChart".  Sign up information is provided on this After Visit Summary.  MyChart is used to connect with patients for Virtual Visits (Telemedicine).  Patients are able to view lab/test results, encounter notes, upcoming appointments, etc.  Non-urgent messages can be sent to your provider as well.   To learn more about what you can do with MyChart, go to [email or web address].    Your next appointment:   3 month(s)  The format for your next appointment:   In Person  Provider:   Bridgette Christopher, MD    

## 2020-09-17 ENCOUNTER — Other Ambulatory Visit (HOSPITAL_COMMUNITY): Payer: Self-pay

## 2020-09-19 ENCOUNTER — Other Ambulatory Visit (HOSPITAL_COMMUNITY): Payer: Self-pay

## 2020-09-22 ENCOUNTER — Other Ambulatory Visit (HOSPITAL_COMMUNITY): Payer: Self-pay

## 2020-09-29 ENCOUNTER — Other Ambulatory Visit: Payer: Self-pay | Admitting: Rheumatology

## 2020-09-29 ENCOUNTER — Other Ambulatory Visit (HOSPITAL_COMMUNITY): Payer: Self-pay

## 2020-09-29 ENCOUNTER — Other Ambulatory Visit: Payer: Self-pay | Admitting: Internal Medicine

## 2020-09-29 ENCOUNTER — Other Ambulatory Visit: Payer: Self-pay | Admitting: Cardiovascular Disease

## 2020-09-29 MED ORDER — APIXABAN 5 MG PO TABS
ORAL_TABLET | Freq: Two times a day (BID) | ORAL | 1 refills | Status: DC
  Filled 2020-09-29 – 2020-12-31 (×2): qty 180, 90d supply, fill #0

## 2020-09-29 MED ORDER — HYDROCODONE-ACETAMINOPHEN 5-325 MG PO TABS
ORAL_TABLET | ORAL | 0 refills | Status: DC
  Filled 2020-09-29: qty 90, 30d supply, fill #0

## 2020-09-29 MED FILL — Potassium Chloride Microencapsulated Crys ER Tab 20 mEq: ORAL | 90 days supply | Qty: 90 | Fill #1 | Status: AC

## 2020-09-29 NOTE — Telephone Encounter (Signed)
Prescription refill request for Eliquis received. Indication:atrial fib Last office visit:7/22 Scr:0.8 Age: 86 Weight:102.1 kg  Prescription refilled

## 2020-09-29 NOTE — Telephone Encounter (Signed)
Last Visit: 08/20/2020 Next Visit: due 01/2021. Message sent to the front desk to schedule.  UDS:08/20/2020 c/w Narc Agreement: 08/20/2020  Last Fill: 08/28/2020  Okay to refill hydrocodone?

## 2020-09-30 ENCOUNTER — Encounter: Payer: Self-pay | Admitting: Cardiovascular Disease

## 2020-09-30 ENCOUNTER — Other Ambulatory Visit (HOSPITAL_COMMUNITY): Payer: Self-pay

## 2020-09-30 ENCOUNTER — Other Ambulatory Visit: Payer: Self-pay

## 2020-09-30 ENCOUNTER — Ambulatory Visit (INDEPENDENT_AMBULATORY_CARE_PROVIDER_SITE_OTHER): Payer: Medicare Other | Admitting: Cardiovascular Disease

## 2020-09-30 VITALS — BP 130/84 | HR 76 | Ht 72.5 in | Wt 229.0 lb

## 2020-09-30 DIAGNOSIS — I25119 Atherosclerotic heart disease of native coronary artery with unspecified angina pectoris: Secondary | ICD-10-CM | POA: Diagnosis not present

## 2020-09-30 DIAGNOSIS — I25118 Atherosclerotic heart disease of native coronary artery with other forms of angina pectoris: Secondary | ICD-10-CM | POA: Diagnosis not present

## 2020-09-30 DIAGNOSIS — I484 Atypical atrial flutter: Secondary | ICD-10-CM

## 2020-09-30 DIAGNOSIS — I10 Essential (primary) hypertension: Secondary | ICD-10-CM | POA: Diagnosis not present

## 2020-09-30 MED ORDER — PREDNISONE 50 MG PO TABS
ORAL_TABLET | ORAL | 0 refills | Status: DC
  Filled 2020-09-30: qty 3, 3d supply, fill #0

## 2020-09-30 MED ORDER — BUTALBITAL-APAP-CAFFEINE 50-325-40 MG PO TABS
ORAL_TABLET | ORAL | 0 refills | Status: DC
  Filled 2020-09-30: qty 180, fill #0
  Filled 2020-10-21: qty 180, 90d supply, fill #0

## 2020-09-30 MED FILL — Nitroglycerin SL Tab 0.4 MG: SUBLINGUAL | 8 days supply | Qty: 25 | Fill #0 | Status: AC

## 2020-09-30 NOTE — Progress Notes (Signed)
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  I have followed Dr. Lorin Picket for many years.  He has recently been seen by Dr. Harrell Gave.  His last heart catheterization performed October 15, 2019 demonstrated nonobstructive LAD stenosis, continued patency of the stented segment in the left circumflex, and progressive severe stenosis in the distal RCA which was treated with a 2.75 x 16 mm Synergy DES postdilated with a 3.0 mm noncompliant balloon.  The patient is here with his wife today.  He has been much more limited by anginal chest discomfort over the last few months.  He really never noticed significant improvement with his anginal chest discomfort after undergoing PCI last year.  However, he has clear progression of symptoms over the last few months.  His symptoms are especially bad after eating.  He has angina with minimal activity if he has eaten a meal.  He describes a pressure-like sensation in the center of the chest radiating to the neck and also experiencing symptoms in the left wrist.  This has been his typical anginal equivalent in the past.  There is associated shortness of breath.  He has not had resting symptoms.  He thinks his atrial fibrillation has been fairly well controlled and he is compliant with metoprolol succinate 100 mg twice daily.  He has not required sublingual  nitroglycerin.  He has had occasional nocturnal angina.  Past Medical History:  Diagnosis Date   Aortic stenosis    moderate aortic stenosis   Arthritis    Benign prostatic hypertrophy    Chronic systolic heart failure (HCC)    Chronotropic incompetence with sinus node dysfunction (HCC)    Status post Guidant dual-mode, dual-pacing, dual-sensing  pacemaker   implantation now programmed to AAI with recent generator change.   Coronary artery disease    status post multiple prior percutaneous coronary interventions, microvascular angina per Dr Olevia Perches   Diverticulitis sigmoid colon recurrent    Dysfunctional autonomic nervous system    Dyspnea    Heart murmur    History of primary hypertension    Hypercoagulable state ([place])    chronically anticoagulated with coumadin   Hyperlipidemia    Hyperthyroidism    Hypothyroidism    Dr. 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 Lia Foyer,    Prediabetes 09/21/2017   Stroke ([place])    1990    Past Surgical History:  Procedure Laterality Date   AORTIC VALVE REPLACEMENT  03/15/2011   Procedure: AORTIC VALVE REPLACEMENT (AVR);  Surgeon: Gaye Pollack, MD;  Location: [place];  Service: Open Heart Surgery;  Laterality: N/A;   APPENDECTOMY     CARDIAC CATHETERIZATION     11   CARDIOVERSION     CARDIOVERSION  04/15/2011   Procedure: CARDIOVERSION;  Surgeon: Loralie Champagne, MD;  Location: [place];  Service: Cardiovascular;  Laterality: N/A;   CARDIOVERSION N/A 09/11/2014   Procedure: CARDIOVERSION;  Surgeon: Sueanne Margarita, MD;  Location: [facility] ENDOSCOPY;  Service: Cardiovascular;  Laterality: N/A;   CARDIOVERSION N/A 06/27/2015   Procedure: CARDIOVERSION;  Surgeon: Thayer Headings, MD;  Location: [place];  Service: Cardiovascular;  Laterality: N/A;   CARDIOVERSION N/A 07/04/2015   Procedure: CARDIOVERSION;  Surgeon: Evans Lance, MD;  Location: [place];  Service: Cardiovascular;  Laterality: N/A;   CARDIOVERSION N/A 04/13/2017   Procedure: CARDIOVERSION;  Surgeon: Sueanne Margarita, MD;  Location: [facility] ENDOSCOPY;  Service: Cardiovascular;  Laterality: N/A;   COLONOSCOPY     CORONARY STENT INTERVENTION N/A 10/15/2019   Procedure: CORONARY STENT INTERVENTION;  Surgeon: Jettie Booze, MD;  Location: [place] CV LAB;  Service: Cardiovascular;  Laterality: N/A;   EP IMPLANTABLE DEVICE N/A 06/16/2015   Procedure: Pacemaker Implant;  Surgeon: Evans Lance, MD;  Location: [place] CV LAB;  Service: Cardiovascular;  Laterality: N/A;   hemrrhoidectomy     LEFT AND RIGHT HEART CATHETERIZATION WITH CORONARY ANGIOGRAM Bilateral 02/01/2011   Procedure: LEFT AND RIGHT HEART CATHETERIZATION WITH CORONARY ANGIOGRAM;  Surgeon: Hillary Bow, MD;  Location: [facility] CATH LAB;  Service: Cardiovascular;  Laterality: Bilateral;   LEFT HEART CATH AND CORONARY ANGIOGRAPHY N/A 10/15/2019   Procedure: LEFT HEART CATH AND CORONARY ANGIOGRAPHY;  Surgeon: Jettie Booze, MD;  Location: [place] CV LAB;  Service: Cardiovascular;  Laterality: N/A;   MAZE  03/15/2011   Procedure: MAZE;  Surgeon: Gaye Pollack, MD;  Location: [place];  Service: Open Heart Surgery;  Laterality: N/A;   PACEMAKER INSERTION  1991   Guidant PPM, most recent Generator Change by Dr Olevia Perches was 08/22/06   RIGHT/LEFT HEART CATH AND CORONARY ANGIOGRAPHY N/A 07/06/2016   Procedure: Right/Left Heart Cath and Coronary Angiography;  Surgeon: Sherren Mocha, MD;  Location: [place] CV LAB;  Service: Cardiovascular;  Laterality: N/A;   TEE WITHOUT CARDIOVERSION  04/15/2011   Procedure: TRANSESOPHAGEAL ECHOCARDIOGRAM (TEE);  Surgeon: Loralie Champagne, MD;  Location: [place];  Service: Cardiovascular;  Laterality: N/A;   TEE WITHOUT CARDIOVERSION N/A 09/11/2014   Procedure: TRANSESOPHAGEAL ECHOCARDIOGRAM (TEE);  Surgeon: Sueanne Margarita, MD;  Location: [facility]  ENDOSCOPY;  Service: Cardiovascular;  Laterality: N/A;    Current Medications: Current Meds  Medication Sig   amoxicillin (AMOXIL) 500 MG capsule TAKE 4 CAPSULES BY MOUTH 1 HOUR PRIOR TO DENTAL APPT (Patient taking differently: Take 2,000 mg by mouth once.)   apixaban (ELIQUIS) 5 MG TABS tablet TAKE 1 TABLET BY MOUTH 2 TIMES DAILY. (Patient taking differently: Take 5 mg by mouth 2 (two) times daily.)   bisacodyl (DULCOLAX) 5 MG EC tablet Take 5 mg by mouth every other day.   butalbital-acetaminophen-caffeine (FIORICET, ESGIC) 50-325-40 MG per tablet Take 1 tablet by mouth 2 (two) times daily.   Cholecalciferol (VITAMIN D3) 2000 units capsule Take 2,000 Units by mouth daily.   Coenzyme Q10 200 MG TABS Take 200 mg by mouth daily.   diclofenac Sodium (VOLTAREN) 1 % GEL Apply 2 g topically 2 (two) times daily as needed (knee pain).   doxazosin (CARDURA) 4 MG tablet TAKE 1 TABLET BY MOUTH ONCE A DAY (Patient taking differently: Take 4 mg by mouth at bedtime.)   ezetimibe (ZETIA) 10 MG tablet Take 1 tablet (10 mg total) by mouth daily.   HYDROcodone-acetaminophen (NORCO/VICODIN)  5-325 MG tablet TAKE 1 TABLET BY MOUTH 3 TIMES DAILY AS NEEDED FOR MODERATE PAIN (Patient taking differently: Take 1 tablet by mouth 3 (three) times daily as needed for moderate pain or severe pain.)   LORazepam (ATIVAN) 1 MG tablet TAKE 1 TABLET BY MOUTH AT BEDTIME. MAY ALSO TAKE 1/2 TABLET DAILY AS NEEDED FOR ANXIETY. (Patient taking differently: Take 1 mg by mouth at bedtime.)   magnesium oxide (MAG-OX) 400 MG tablet Take 400 mg by mouth daily.   metoprolol succinate (TOPROL-XL) 100 MG 24 hr tablet Take 1 tablet by mouth twice daily (Patient taking differently: Take 100 mg by mouth 2 (two) times daily.)   nitroGLYCERIN (NITROSTAT) 0.4 MG SL tablet PLACE 1 TABLET UNDER THE TONGUE EVERY 5 MINUTES AS NEEDED FOR CHEST PAIN. (Patient taking differently: Place 0.4 mg under the tongue every 5 (five) minutes as needed for chest  pain.)   OVER THE COUNTER MEDICATION Apply 1 patch topically daily as needed (neck and shoulder pain). Chinese pain patch   pantoprazole (PROTONIX) 40 MG tablet TAKE 1 TABLET (40 MG TOTAL) BY MOUTH DAILY. (Patient taking differently: Take 40 mg by mouth daily.)   polyethylene glycol (MIRALAX / GLYCOLAX) 17 g packet Take 17 g by mouth daily as needed for mild constipation.    potassium chloride SA (KLOR-CON) 20 MEQ tablet TAKE 1 TABLET BY MOUTH DAILY. (Patient taking differently: Take 20 mEq by mouth daily.)   predniSONE (DELTASONE) 10 MG tablet Take 1 tablet (10 mg total) by mouth daily with breakfast. (Patient taking differently: Take 5 mg by mouth daily with breakfast.)   predniSONE (DELTASONE) 50 MG tablet Take as directed prior to procedure   rosuvastatin (CRESTOR) 20 MG tablet TAKE 1 TABLET (20 MG TOTAL) BY MOUTH AT BEDTIME. (Patient taking differently: Take 20 mg by mouth at bedtime.)     Allergies:   Contrast media [iodinated diagnostic agents], Gadolinium derivatives, and Metrizamide   Social History   Socioeconomic History   Marital status: Married    Spouse name: Baker Janus    Number of children: 3   Years of education: Not on file   Highest education level: Not on file  Occupational History   Occupation: Retired    Comment: Physician  Tobacco Use   Smoking status: Never   Smokeless tobacco: Never  Vaping Use   Vaping Use: Never used  Substance and Sexual Activity   Alcohol use: No    Alcohol/week: 0.0 standard drinks   Drug use: No   Sexual activity: Not Currently  Other Topics Concern   Not on file  Social History Narrative   Married to [place]. Has grown children   Retired Horticulturist, commercial MD      Never smoker no alcohol      Social Determinants of Radio broadcast assistant Strain: Not on file  Food Insecurity: Not on file  Transportation Needs: Not on file  Physical Activity: Not on file  Stress: Not on file  Social Connections: Not on file     Family History: The  patient's family history includes Anorexia nervosa in his daughter; Atrial fibrillation in his brother; CAD in his brother; Depression in his daughter; Heart disease in his brother; Hypertension in his son; Sarcoidosis in his brother. There is no history of Anesthesia problems, Hypotension, Malignant hyperthermia, or Pseudochol deficiency.  ROS:   Please see the history of present illness.    Positive for shoulder pain, back pain, fatigue.  All other systems reviewed and are negative.  EKGs/Labs/Other Studies Reviewed:    The following studies were reviewed today: Echocardiogram 01/18/2020:  1. Left ventricular ejection fraction, by estimation, is 45 to 50%. The  left ventricle has mildly decreased function. The left ventricle  demonstrates regional wall motion abnormalities (see scoring  diagram/findings for description). There is mild left  ventricular hypertrophy. Left ventricular diastolic function could not be  evaluated. There is severe akinesis of the left ventricular, basal-mid  inferior wall suggestive of scar.   2. Left atrial size was moderately dilated.   3. The mitral valve is abnormal. Mild mitral valve regurgitation.   4. The aortic valve has been repaired/replaced with a bioprosthetic valve  (2013). Aortic valve area, by VTI measures 1.27 cm. Aortic valve mean  gradient measures 9.0 mmHg. Aortic valve Vmax measures 2.05 m/s. DI of  0.33.   5. Aortic dilatation noted. There is mild dilatation of the ascending  aorta, measuring 41 mm.   6. Right atrial size was mildly dilated.   7. Right ventricular systolic function is low normal. The right  ventricular size is normal. There is normal pulmonary artery systolic  pressure.   8. The inferior vena cava is dilated in size with >50% respiratory  variability, suggesting right atrial pressure of 8 mmHg.   Comparison(s): No significant change from prior study. 10/15/2019: LVEF  45-50%, basal to mid inferior hypokinesis,  bioprosthetic AVR - mean  gradient 7 mmHg.   Cardiac catheterization 10/15/2019: Conclusion    Prox LAD to Mid LAD lesion is 40% stenosed. Prox RCA lesion is 30% stenosed. Mid RCA lesion is 40% stenosed. Previously placed Mid Cx to Dist Cx stent (unknown type) is widely patent. Dist RCA lesion is 75% stenosed. This was new compared to prior cath in 2018. A drug-eluting stent was successfully placed using a SYNERGY XD 2.75X16. THis was dilated to 3.0 mm and postdilated to 3.4 mm. Post intervention, there is a 0% residual stenosis. There is moderate left ventricular systolic dysfunction. LV end diastolic pressure is normal. The left ventricular ejection fraction is 35-45% by visual estimate. Global hypokinesis.   No aspirin given Eliquis and Plavix.  Could use Plavix longer if no bleeding issues, but bare minimum would be one month.     Continue aggressive secondary prevention and management of his tachycardia.  Diagnostic Dominance: Right  Left Main  Vessel is angiographically normal.  Left Anterior Descending  Prox LAD to Mid LAD lesion is 40% stenosed. There is mild diffuse proximal LAD stenosis unchanged from previous study. The LAD wraps around the LV apex  Left Circumflex  Previously placed Mid Cx to Dist Cx stent (unknown type) is widely patent.  Right Coronary Artery  There is mild diffuse disease throughout the vessel. Large, dominant vessel. There are diffuse irregularities with mild stenoses in the proximal and mid vessel. The distal vessel is widely patent. The PDA and PLA branches are patent.  Prox RCA lesion is 30% stenosed. The lesion is calcified.  Mid RCA lesion is 40% stenosed. The lesion is calcified.  Dist RCA lesion is 75% stenosed.   Intervention   Dist RCA lesion  Stent  CATH LAUNCHER 6FR JR4 guide catheter was inserted. Lesion crossed with guidewire using a WIRE ASAHI PROWATER 180CM. Pre-stent angioplasty was performed using a BALLOON SAPPHIRE 2.5X12. A  drug-eluting stent was successfully placed using a SYNERGY XD 2.75X16. Stent strut is well apposed. Post-stent angioplasty was performed using a BALLOON SAPPHIRE [place] 3.25X12.  Post-Intervention Lesion Assessment  The intervention was successful.  Pre-interventional TIMI flow is 3. Post-intervention TIMI flow is 3. No complications occurred at this lesion.  There is a 0% residual stenosis post intervention.    Wall Motion  Resting                Left Heart  Left Ventricle The left ventricular size is in the upper limits of normal. There is moderate left ventricular systolic dysfunction. LV end diastolic pressure is normal. The left ventricular ejection fraction is 35-45% by visual estimate. There are LV function abnormalities due to global hypokinesis.  Aortic Valve The patient has a bioprosthetic aortic valve that opens and closes well. No gradient across bioprosthetic valve.   Coronary Diagrams   Diagnostic Dominance: Right    Intervention    EKG:  EKG is ordered today.  The ekg ordered today demonstrates atypical atrial flutter with ventricular rate 76 bpm, right bundle branch block, left anterior fascicular block, no significant change from previous tracing  Recent Labs: 10/14/2019: TSH 2.231 10/15/2019: Magnesium 2.2 08/26/2020: ALT 19 09/30/2020: BUN 18; Creatinine, Ser 0.97; Hemoglobin 11.5; Platelets 192; Potassium 4.3; Sodium 139  Recent Lipid Panel    Component Value Date/Time   CHOL 137 08/26/2020 1234   CHOL 158 09/02/2017 1006   TRIG 66 08/26/2020 1234   HDL 75 08/26/2020 1234   HDL 72 09/02/2017 1006   CHOLHDL 1.8 08/26/2020 1234   VLDL 13 08/26/2020 1234   LDLCALC 49 08/26/2020 1234   LDLCALC 68 09/02/2017 1006   LDLDIRECT 117.9 11/16/2010 1355     Risk Assessment/Calculations:    CHA2DS2-VASc Score = 5  This indicates a 7.2% annual risk of stroke. The patient's score is based upon: CHF History: Yes HTN History: Yes Diabetes History: No Stroke  History: No Vascular Disease History: Yes Age Score: 2 Gender Score: 0          Physical Exam:    VS:  BP 130/84   Pulse 76   Ht 6' 0.5" (1.842 m)   Wt 229 lb (103.9 kg) Comment: patient stated weight-would not weigh  SpO2 98%   BMI 30.63 kg/m     Wt Readings from Last 3 Encounters:  09/30/20 229 lb (103.9 kg)  09/15/20 225 lb (102.1 kg)  08/26/20 236 lb 1.6 oz (107.1 kg)     GEN:  Well nourished, well developed in no acute distress HEENT: Normal NECK: No JVD; No carotid bruits LYMPHATICS: No lymphadenopathy CARDIAC: RRR, 2/6 systolic murmur at the right upper sternal border RESPIRATORY:  Clear to auscultation without rales, wheezing or rhonchi  ABDOMEN: Soft, non-tender, non-distended MUSCULOSKELETAL:  No edema; No deformity  SKIN: Warm and dry NEUROLOGIC:  Alert and oriented x 3 PSYCHIATRIC:  Normal affect   ASSESSMENT:    1. Coronary artery disease of native artery of native heart with stable angina pectoris ([place])   2. Atypical atrial flutter (HCC)   3. Essential hypertension    PLAN:    In order of problems listed above:  The patient has progressive symptoms of angina on maximally tolerated medical therapy.  He has progressed and now is having CCS functional class III symptoms.  I have reviewed his cardiac catheterization films from 1 year ago when he underwent stenting of the distal RCA with a good angiographic result.  There was no other high-grade stenosis at the time of his last procedure.  Due to his marked functional limitation, I have recommended repeat cardiac catheterization with consideration of PCI.  I have reviewed the risks, indications,  and alternatives to cardiac catheterization, possible angioplasty, and stenting with the patient. Risks include but are not limited to bleeding, infection, vascular injury, stroke, myocardial infection, arrhythmia, kidney injury, radiation-related injury in the case of prolonged fluoroscopy use, emergency cardiac  surgery, and death. The patient understands the risks of serious complication is 1-2 in [id number] with diagnostic cardiac cath and 1-2% or less with angioplasty/stenting.  The patient is in agreement and provides full informed consent.  He is instructed on holding apixaban prior to the procedure. Followed by Dr. Lovena Le.  Heart rate well controlled.  Tolerating anticoagulation with apixaban. Blood pressure controlled on current medical therapy.   Shared Decision Making/Informed Consent The risks [stroke (1 in 1000), death (1 in 1000), kidney failure [usually temporary] (1 in 500), bleeding (1 in 200), allergic reaction [possibly serious] (1 in 200)], benefits (diagnostic support and management of coronary artery disease) and alternatives of a cardiac catheterization were discussed in detail with Mr. Fingerhut and he is willing to proceed.    Medication Adjustments/Labs and Tests Ordered: Current medicines are reviewed at length with the patient today.  Concerns regarding medicines are outlined above.  Orders Placed This Encounter  Procedures   CBC   Basic metabolic panel   EKG [id number]    Meds ordered this encounter  Medications   predniSONE (DELTASONE) 50 MG tablet    Sig: Take as directed prior to procedure    Dispense:  3 tablet    Refill:  0     Patient Instructions  Medication Instructions:  No changes today *If you need a refill on your cardiac medications before your next appointment, please call your pharmacy*   Lab Work: Today: CBC, BMET   Testing/Procedures: Your physician has requested that you have a cardiac catheterization. Cardiac catheterization is used to diagnose and/or treat various heart conditions. Doctors may recommend this procedure for a number of different reasons. The most common reason is to evaluate chest pain. Chest pain can be a symptom of coronary artery disease (CAD), and cardiac catheterization can show whether plaque is narrowing or blocking your heart's  arteries. This procedure is also used to evaluate the valves, as well as measure the blood flow and oxygen levels in different parts of your heart. For further information please visit [facility]. Please follow instruction sheet, as given.   Follow-Up: To be determined based on cath results.  Other Instructions  [place] OFFICE [place], [place] [address] Dept: [phone] Loc: [phone]  Kevin Buff, MD  09/30/2020  You are scheduled for a Cardiac Catheterization on Tuesday, August 9 with Dr. Sherren Mocha.  1. Please arrive at the [facility] (Main Entrance A) at [facility]: [address] [place], [place] [address] at 9:00 AM (This time is two hours before your procedure to ensure your preparation). Free valet parking service is available.   Special note: Every effort is made to have your procedure done on time. Please understand that emergencies sometimes delay scheduled procedures.  2. Diet: Do not eat solid foods after midnight.  The patient may have clear liquids until 5am upon the day of the procedure.  3. Labs: completed 09/30/2020  4. Medication instructions in preparation for your procedure:   Contrast Allergy: Yes, Please take Prednisone '50mg'$  by mouth at: Thirteen hours prior to cath 10:00pm on Monday  10/06/20 Seven hours prior to cath 4:00am on Tuesday 10/07/20 And prior to leaving  home please take last dose of Prednisone '50mg'$  and Benadryl '50mg'$  by mouth.   Do not take your Eliquis after Saturday 10/04/20 until you are told to restart after cath. Do not take your usual dose of Prednisone on the morning of cath (5 mg) On the morning of your procedure, take your Aspirin and any morning medicines NOT listed above.  You may use sips of water.  5. Plan for one night stay--bring personal belongings. 6. Bring a current list of your medications and current  insurance cards. 7. You MUST have a responsible person to drive you home. 8. Someone MUST be with you the first 24 hours after you arrive home or your discharge will be delayed. 9. Please wear clothes that are easy to get on and off and wear slip-on shoes.  Thank you for allowing [place] to care for you!   -- [facility] In[facility] Cardiovascular services   Signed, Sherren Mocha, MD  10/02/2020 10:21 AM    [place]

## 2020-09-30 NOTE — H&P (View-Only) (Signed)
Cardiology Office Note:    Date:  10/02/2020   ID:  Kevin Buff, [name], DOB 05-18-1934, MRN [id number]  PCP:  [name], [name]   [facility] Providers Cardiologist:  Sherren Mocha, [name]     Referring [name]: [name], [name]   Chief Complaint  Patient presents with   Coronary Artery Disease    History of Present Illness:    Kevin Buff, [name] is a 85 y.o. male with a hx of longstanding atypical atrial flutter and atrial tachycardia, coronary artery disease with chronic angina, aortic valve disease status post bioprosthetic aortic valve replacement, orthostatic hypotension, and chronic diastolic heart failure, presenting for evaluation of chest pain.  I have followed Dr. Lorin Picket for many years.  He has recently been seen by Dr. Harrell Gave.  His last heart catheterization performed October 15, 2019 demonstrated nonobstructive LAD stenosis, continued patency of the stented segment in the left circumflex, and progressive severe stenosis in the distal RCA which was treated with a 2.75 x 16 mm Synergy DES postdilated with a 3.0 mm noncompliant balloon.  The patient is here with his wife today.  He has been much more limited by anginal chest discomfort over the last few months.  He really never noticed significant improvement with his anginal chest discomfort after undergoing PCI last year.  However, he has clear progression of symptoms over the last few months.  His symptoms are especially bad after eating.  He has angina with minimal activity if he has eaten a meal.  He describes a pressure-like sensation in the center of the chest radiating to the neck and also experiencing symptoms in the left wrist.  This has been his typical anginal equivalent in the past.  There is associated shortness of breath.  He has not had resting symptoms.  He thinks his atrial fibrillation has been fairly well controlled and he is compliant with metoprolol succinate 100 mg twice daily.  He has not required sublingual  nitroglycerin.  He has had occasional nocturnal angina.  Past Medical History:  Diagnosis Date   Aortic stenosis    moderate aortic stenosis   Arthritis    Benign prostatic hypertrophy    Chronic systolic heart failure (HCC)    Chronotropic incompetence with sinus node dysfunction (HCC)    Status post Guidant dual-mode, dual-pacing, dual-sensing  pacemaker   implantation now programmed to AAI with recent generator change.   Coronary artery disease    status post multiple prior percutaneous coronary interventions, microvascular angina per Dr Olevia Perches   Diverticulitis sigmoid colon recurrent    Dysfunctional autonomic nervous system    Dyspnea    Heart murmur    History of primary hypertension    Hypercoagulable state ([place])    chronically anticoagulated with coumadin   Hyperlipidemia    Hyperthyroidism    Hypothyroidism    Dr. 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 Lia Foyer,    Prediabetes 09/21/2017   Stroke ([place])    1990    Past Surgical History:  Procedure Laterality Date   AORTIC VALVE REPLACEMENT  03/15/2011   Procedure: AORTIC VALVE REPLACEMENT (AVR);  Surgeon: Gaye Pollack, [name];  Location: [place];  Service: Open Heart Surgery;  Laterality: N/A;   APPENDECTOMY     CARDIAC CATHETERIZATION     11   CARDIOVERSION     CARDIOVERSION  04/15/2011   Procedure: CARDIOVERSION;  Surgeon: Loralie Champagne, [name];  Location: [place];  Service: Cardiovascular;  Laterality: N/A;   CARDIOVERSION N/A 09/11/2014   Procedure: CARDIOVERSION;  Surgeon: Sueanne Margarita, [name];  Location: [facility] ENDOSCOPY;  Service: Cardiovascular;  Laterality: N/A;   CARDIOVERSION N/A 06/27/2015   Procedure: CARDIOVERSION;  Surgeon: Thayer Headings, [name];  Location: [place];  Service: Cardiovascular;  Laterality: N/A;   CARDIOVERSION N/A 07/04/2015   Procedure: CARDIOVERSION;  Surgeon: Evans Lance, [name];  Location: [place];  Service: Cardiovascular;  Laterality: N/A;   CARDIOVERSION N/A 04/13/2017   Procedure: CARDIOVERSION;  Surgeon: Sueanne Margarita, [name];  Location: [facility] ENDOSCOPY;  Service: Cardiovascular;  Laterality: N/A;   COLONOSCOPY     CORONARY STENT INTERVENTION N/A 10/15/2019   Procedure: CORONARY STENT INTERVENTION;  Surgeon: Jettie Booze, [name];  Location: [place] CV LAB;  Service: Cardiovascular;  Laterality: N/A;   EP IMPLANTABLE DEVICE N/A 06/16/2015   Procedure: Pacemaker Implant;  Surgeon: Evans Lance, [name];  Location: [place] CV LAB;  Service: Cardiovascular;  Laterality: N/A;   hemrrhoidectomy     LEFT AND RIGHT HEART CATHETERIZATION WITH CORONARY ANGIOGRAM Bilateral 02/01/2011   Procedure: LEFT AND RIGHT HEART CATHETERIZATION WITH CORONARY ANGIOGRAM;  Surgeon: Hillary Bow, [name];  Location: [facility] CATH LAB;  Service: Cardiovascular;  Laterality: Bilateral;   LEFT HEART CATH AND CORONARY ANGIOGRAPHY N/A 10/15/2019   Procedure: LEFT HEART CATH AND CORONARY ANGIOGRAPHY;  Surgeon: Jettie Booze, [name];  Location: [place] CV LAB;  Service: Cardiovascular;  Laterality: N/A;   MAZE  03/15/2011   Procedure: MAZE;  Surgeon: Gaye Pollack, [name];  Location: [place];  Service: Open Heart Surgery;  Laterality: N/A;   PACEMAKER INSERTION  1991   Guidant PPM, most recent Generator Change by Dr Olevia Perches was 08/22/06   RIGHT/LEFT HEART CATH AND CORONARY ANGIOGRAPHY N/A 07/06/2016   Procedure: Right/Left Heart Cath and Coronary Angiography;  Surgeon: Sherren Mocha, [name];  Location: [place] CV LAB;  Service: Cardiovascular;  Laterality: N/A;   TEE WITHOUT CARDIOVERSION  04/15/2011   Procedure: TRANSESOPHAGEAL ECHOCARDIOGRAM (TEE);  Surgeon: Loralie Champagne, [name];  Location: [place];  Service: Cardiovascular;  Laterality: N/A;   TEE WITHOUT CARDIOVERSION N/A 09/11/2014   Procedure: TRANSESOPHAGEAL ECHOCARDIOGRAM (TEE);  Surgeon: Sueanne Margarita, [name];  Location: [facility]  ENDOSCOPY;  Service: Cardiovascular;  Laterality: N/A;    Current Medications: Current Meds  Medication Sig   amoxicillin (AMOXIL) 500 MG capsule TAKE 4 CAPSULES BY MOUTH 1 HOUR PRIOR TO DENTAL APPT (Patient taking differently: Take 2,000 mg by mouth once.)   apixaban (ELIQUIS) 5 MG TABS tablet TAKE 1 TABLET BY MOUTH 2 TIMES DAILY. (Patient taking differently: Take 5 mg by mouth 2 (two) times daily.)   bisacodyl (DULCOLAX) 5 MG EC tablet Take 5 mg by mouth every other day.   butalbital-acetaminophen-caffeine (FIORICET, ESGIC) 50-325-40 MG per tablet Take 1 tablet by mouth 2 (two) times daily.   Cholecalciferol (VITAMIN D3) 2000 units capsule Take 2,000 Units by mouth daily.   Coenzyme Q10 200 MG TABS Take 200 mg by mouth daily.   diclofenac Sodium (VOLTAREN) 1 % GEL Apply 2 g topically 2 (two) times daily as needed (knee pain).   doxazosin (CARDURA) 4 MG tablet TAKE 1 TABLET BY MOUTH ONCE A DAY (Patient taking differently: Take 4 mg by mouth at bedtime.)   ezetimibe (ZETIA) 10 MG tablet Take 1 tablet (10 mg total) by mouth daily.   HYDROcodone-acetaminophen (NORCO/VICODIN)  5-325 MG tablet TAKE 1 TABLET BY MOUTH 3 TIMES DAILY AS NEEDED FOR MODERATE PAIN (Patient taking differently: Take 1 tablet by mouth 3 (three) times daily as needed for moderate pain or severe pain.)   LORazepam (ATIVAN) 1 MG tablet TAKE 1 TABLET BY MOUTH AT BEDTIME. MAY ALSO TAKE 1/2 TABLET DAILY AS NEEDED FOR ANXIETY. (Patient taking differently: Take 1 mg by mouth at bedtime.)   magnesium oxide (MAG-OX) 400 MG tablet Take 400 mg by mouth daily.   metoprolol succinate (TOPROL-XL) 100 MG 24 hr tablet Take 1 tablet by mouth twice daily (Patient taking differently: Take 100 mg by mouth 2 (two) times daily.)   nitroGLYCERIN (NITROSTAT) 0.4 MG SL tablet PLACE 1 TABLET UNDER THE TONGUE EVERY 5 MINUTES AS NEEDED FOR CHEST PAIN. (Patient taking differently: Place 0.4 mg under the tongue every 5 (five) minutes as needed for chest  pain.)   OVER THE COUNTER MEDICATION Apply 1 patch topically daily as needed (neck and shoulder pain). Chinese pain patch   pantoprazole (PROTONIX) 40 MG tablet TAKE 1 TABLET (40 MG TOTAL) BY MOUTH DAILY. (Patient taking differently: Take 40 mg by mouth daily.)   polyethylene glycol (MIRALAX / GLYCOLAX) 17 g packet Take 17 g by mouth daily as needed for mild constipation.    potassium chloride SA (KLOR-CON) 20 MEQ tablet TAKE 1 TABLET BY MOUTH DAILY. (Patient taking differently: Take 20 mEq by mouth daily.)   predniSONE (DELTASONE) 10 MG tablet Take 1 tablet (10 mg total) by mouth daily with breakfast. (Patient taking differently: Take 5 mg by mouth daily with breakfast.)   predniSONE (DELTASONE) 50 MG tablet Take as directed prior to procedure   rosuvastatin (CRESTOR) 20 MG tablet TAKE 1 TABLET (20 MG TOTAL) BY MOUTH AT BEDTIME. (Patient taking differently: Take 20 mg by mouth at bedtime.)     Allergies:   Contrast media [iodinated diagnostic agents], Gadolinium derivatives, and Metrizamide   Social History   Socioeconomic History   Marital status: Married    Spouse name: Baker Janus    Number of children: 3   Years of education: Not on file   Highest education level: Not on file  Occupational History   Occupation: Retired    Comment: Physician  Tobacco Use   Smoking status: Never   Smokeless tobacco: Never  Vaping Use   Vaping Use: Never used  Substance and Sexual Activity   Alcohol use: No    Alcohol/week: 0.0 standard drinks   Drug use: No   Sexual activity: Not Currently  Other Topics Concern   Not on file  Social History Narrative   Married to [place]. Has grown children   Retired Horticulturist, commercial [name]      Never smoker no alcohol      Social Determinants of Radio broadcast assistant Strain: Not on file  Food Insecurity: Not on file  Transportation Needs: Not on file  Physical Activity: Not on file  Stress: Not on file  Social Connections: Not on file     Family History: The  patient's family history includes Anorexia nervosa in his daughter; Atrial fibrillation in his brother; CAD in his brother; Depression in his daughter; Heart disease in his brother; Hypertension in his son; Sarcoidosis in his brother. There is no history of Anesthesia problems, Hypotension, Malignant hyperthermia, or Pseudochol deficiency.  ROS:   Please see the history of present illness.    Positive for shoulder pain, back pain, fatigue.  All other systems reviewed and are negative.  EKGs/Labs/Other Studies Reviewed:    The following studies were reviewed today: Echocardiogram 01/18/2020:  1. Left ventricular ejection fraction, by estimation, is 45 to 50%. The  left ventricle has mildly decreased function. The left ventricle  demonstrates regional wall motion abnormalities (see scoring  diagram/findings for description). There is mild left  ventricular hypertrophy. Left ventricular diastolic function could not be  evaluated. There is severe akinesis of the left ventricular, basal-mid  inferior wall suggestive of scar.   2. Left atrial size was moderately dilated.   3. The mitral valve is abnormal. Mild mitral valve regurgitation.   4. The aortic valve has been repaired/replaced with a bioprosthetic valve  (2013). Aortic valve area, by VTI measures 1.27 cm. Aortic valve mean  gradient measures 9.0 mmHg. Aortic valve Vmax measures 2.05 m/s. DI of  0.33.   5. Aortic dilatation noted. There is mild dilatation of the ascending  aorta, measuring 41 mm.   6. Right atrial size was mildly dilated.   7. Right ventricular systolic function is low normal. The right  ventricular size is normal. There is normal pulmonary artery systolic  pressure.   8. The inferior vena cava is dilated in size with >50% respiratory  variability, suggesting right atrial pressure of 8 mmHg.   Comparison(s): No significant change from prior study. 10/15/2019: LVEF  45-50%, basal to mid inferior hypokinesis,  bioprosthetic AVR - mean  gradient 7 mmHg.   Cardiac catheterization 10/15/2019: Conclusion    Prox LAD to Mid LAD lesion is 40% stenosed. Prox RCA lesion is 30% stenosed. Mid RCA lesion is 40% stenosed. Previously placed Mid Cx to Dist Cx stent (unknown type) is widely patent. Dist RCA lesion is 75% stenosed. This was new compared to prior cath in 2018. A drug-eluting stent was successfully placed using a SYNERGY XD 2.75X16. THis was dilated to 3.0 mm and postdilated to 3.4 mm. Post intervention, there is a 0% residual stenosis. There is moderate left ventricular systolic dysfunction. LV end diastolic pressure is normal. The left ventricular ejection fraction is 35-45% by visual estimate. Global hypokinesis.   No aspirin given Eliquis and Plavix.  Could use Plavix longer if no bleeding issues, but bare minimum would be one month.     Continue aggressive secondary prevention and management of his tachycardia.  Diagnostic Dominance: Right  Left Main  Vessel is angiographically normal.  Left Anterior Descending  Prox LAD to Mid LAD lesion is 40% stenosed. There is mild diffuse proximal LAD stenosis unchanged from previous study. The LAD wraps around the LV apex  Left Circumflex  Previously placed Mid Cx to Dist Cx stent (unknown type) is widely patent.  Right Coronary Artery  There is mild diffuse disease throughout the vessel. Large, dominant vessel. There are diffuse irregularities with mild stenoses in the proximal and mid vessel. The distal vessel is widely patent. The PDA and PLA branches are patent.  Prox RCA lesion is 30% stenosed. The lesion is calcified.  Mid RCA lesion is 40% stenosed. The lesion is calcified.  Dist RCA lesion is 75% stenosed.   Intervention   Dist RCA lesion  Stent  CATH LAUNCHER 6FR JR4 guide catheter was inserted. Lesion crossed with guidewire using a WIRE ASAHI PROWATER 180CM. Pre-stent angioplasty was performed using a BALLOON SAPPHIRE 2.5X12. A  drug-eluting stent was successfully placed using a SYNERGY XD 2.75X16. Stent strut is well apposed. Post-stent angioplasty was performed using a BALLOON SAPPHIRE [place] 3.25X12.  Post-Intervention Lesion Assessment  The intervention was successful.  Pre-interventional TIMI flow is 3. Post-intervention TIMI flow is 3. No complications occurred at this lesion.  There is a 0% residual stenosis post intervention.    Wall Motion  Resting                Left Heart  Left Ventricle The left ventricular size is in the upper limits of normal. There is moderate left ventricular systolic dysfunction. LV end diastolic pressure is normal. The left ventricular ejection fraction is 35-45% by visual estimate. There are LV function abnormalities due to global hypokinesis.  Aortic Valve The patient has a bioprosthetic aortic valve that opens and closes well. No gradient across bioprosthetic valve.   Coronary Diagrams   Diagnostic Dominance: Right    Intervention    EKG:  EKG is ordered today.  The ekg ordered today demonstrates atypical atrial flutter with ventricular rate 76 bpm, right bundle branch block, left anterior fascicular block, no significant change from previous tracing  Recent Labs: 10/14/2019: TSH 2.231 10/15/2019: Magnesium 2.2 08/26/2020: ALT 19 09/30/2020: BUN 18; Creatinine, Ser 0.97; Hemoglobin 11.5; Platelets 192; Potassium 4.3; Sodium 139  Recent Lipid Panel    Component Value Date/Time   CHOL 137 08/26/2020 1234   CHOL 158 09/02/2017 1006   TRIG 66 08/26/2020 1234   HDL 75 08/26/2020 1234   HDL 72 09/02/2017 1006   CHOLHDL 1.8 08/26/2020 1234   VLDL 13 08/26/2020 1234   LDLCALC 49 08/26/2020 1234   LDLCALC 68 09/02/2017 1006   LDLDIRECT 117.9 11/16/2010 1355     Risk Assessment/Calculations:    CHA2DS2-VASc Score = 5  This indicates a 7.2% annual risk of stroke. The patient's score is based upon: CHF History: Yes HTN History: Yes Diabetes History: No Stroke  History: No Vascular Disease History: Yes Age Score: 2 Gender Score: 0          Physical Exam:    VS:  BP 130/84   Pulse 76   Ht 6' 0.5" (1.842 m)   Wt 229 lb (103.9 kg) Comment: patient stated weight-would not weigh  SpO2 98%   BMI 30.63 kg/m     Wt Readings from Last 3 Encounters:  09/30/20 229 lb (103.9 kg)  09/15/20 225 lb (102.1 kg)  08/26/20 236 lb 1.6 oz (107.1 kg)     GEN:  Well nourished, well developed in no acute distress HEENT: Normal NECK: No JVD; No carotid bruits LYMPHATICS: No lymphadenopathy CARDIAC: RRR, 2/6 systolic murmur at the right upper sternal border RESPIRATORY:  Clear to auscultation without rales, wheezing or rhonchi  ABDOMEN: Soft, non-tender, non-distended MUSCULOSKELETAL:  No edema; No deformity  SKIN: Warm and dry NEUROLOGIC:  Alert and oriented x 3 PSYCHIATRIC:  Normal affect   ASSESSMENT:    1. Coronary artery disease of native artery of native heart with stable angina pectoris ([place])   2. Atypical atrial flutter (HCC)   3. Essential hypertension    PLAN:    In order of problems listed above:  The patient has progressive symptoms of angina on maximally tolerated medical therapy.  He has progressed and now is having CCS functional class III symptoms.  I have reviewed his cardiac catheterization films from 1 year ago when he underwent stenting of the distal RCA with a good angiographic result.  There was no other high-grade stenosis at the time of his last procedure.  Due to his marked functional limitation, I have recommended repeat cardiac catheterization with consideration of PCI.  I have reviewed the risks, indications,  and alternatives to cardiac catheterization, possible angioplasty, and stenting with the patient. Risks include but are not limited to bleeding, infection, vascular injury, stroke, myocardial infection, arrhythmia, kidney injury, radiation-related injury in the case of prolonged fluoroscopy use, emergency cardiac  surgery, and death. The patient understands the risks of serious complication is 1-2 in [id number] with diagnostic cardiac cath and 1-2% or less with angioplasty/stenting.  The patient is in agreement and provides full informed consent.  He is instructed on holding apixaban prior to the procedure. Followed by Dr. Lovena Le.  Heart rate well controlled.  Tolerating anticoagulation with apixaban. Blood pressure controlled on current medical therapy.   Shared Decision Making/Informed Consent The risks [stroke (1 in 1000), death (1 in 1000), kidney failure [usually temporary] (1 in 500), bleeding (1 in 200), allergic reaction [possibly serious] (1 in 200)], benefits (diagnostic support and management of coronary artery disease) and alternatives of a cardiac catheterization were discussed in detail with Mr. Woodring and he is willing to proceed.    Medication Adjustments/Labs and Tests Ordered: Current medicines are reviewed at length with the patient today.  Concerns regarding medicines are outlined above.  Orders Placed This Encounter  Procedures   CBC   Basic metabolic panel   EKG [id number]    Meds ordered this encounter  Medications   predniSONE (DELTASONE) 50 MG tablet    Sig: Take as directed prior to procedure    Dispense:  3 tablet    Refill:  0     Patient Instructions  Medication Instructions:  No changes today *If you need a refill on your cardiac medications before your next appointment, please call your pharmacy*   Lab Work: Today: CBC, BMET   Testing/Procedures: Your physician has requested that you have a cardiac catheterization. Cardiac catheterization is used to diagnose and/or treat various heart conditions. Doctors may recommend this procedure for a number of different reasons. The most common reason is to evaluate chest pain. Chest pain can be a symptom of coronary artery disease (CAD), and cardiac catheterization can show whether plaque is narrowing or blocking your heart's  arteries. This procedure is also used to evaluate the valves, as well as measure the blood flow and oxygen levels in different parts of your heart. For further information please visit [facility]. Please follow instruction sheet, as given.   Follow-Up: To be determined based on cath results.  Other Instructions  [place] OFFICE [place], [place]  [place] [address] Dept: [phone] Loc: [phone]  Kevin Buff, [name]  09/30/2020  You are scheduled for a Cardiac Catheterization on Tuesday, August 9 with Dr. Sherren Mocha.  1. Please arrive at the [facility] (Main Entrance A) at [facility]: [address] [place], [place] [address] at 9:00 AM (This time is two hours before your procedure to ensure your preparation). Free valet parking service is available.   Special note: Every effort is made to have your procedure done on time. Please understand that emergencies sometimes delay scheduled procedures.  2. Diet: Do not eat solid foods after midnight.  The patient may have clear liquids until 5am upon the day of the procedure.  3. Labs: completed 09/30/2020  4. Medication instructions in preparation for your procedure:   Contrast Allergy: Yes, Please take Prednisone '50mg'$  by mouth at: Thirteen hours prior to cath 10:00pm on Monday  10/06/20 Seven hours prior to cath 4:00am on Tuesday 10/07/20 And prior to leaving  home please take last dose of Prednisone '50mg'$  and Benadryl '50mg'$  by mouth.   Do not take your Eliquis after Saturday 10/04/20 until you are told to restart after cath. Do not take your usual dose of Prednisone on the morning of cath (5 mg) On the morning of your procedure, take your Aspirin and any morning medicines NOT listed above.  You may use sips of water.  5. Plan for one night stay--bring personal belongings. 6. Bring a current list of your medications and current  insurance cards. 7. You MUST have a responsible person to drive you home. 8. Someone MUST be with you the first 24 hours after you arrive home or your discharge will be delayed. 9. Please wear clothes that are easy to get on and off and wear slip-on shoes.  Thank you for allowing [place] to care for you!   -- [facility] Invasive Cardiovascular services   Signed, Sherren Mocha, [name]  10/02/2020 10:21 AM    [place]

## 2020-09-30 NOTE — Patient Instructions (Addendum)
Medication Instructions:  No changes today *If you need a refill on your cardiac medications before your next appointment, please call your pharmacy*   Lab Work: Today: CBC, BMET   Testing/Procedures: Your physician has requested that you have a cardiac catheterization. Cardiac catheterization is used to diagnose and/or treat various heart conditions. Doctors may recommend this procedure for a number of different reasons. The most common reason is to evaluate chest pain. Chest pain can be a symptom of coronary artery disease (CAD), and cardiac catheterization can show whether plaque is narrowing or blocking your heart's arteries. This procedure is also used to evaluate the valves, as well as measure the blood flow and oxygen levels in different parts of your heart. For further information please visit [facility]uge[employer]iesta[employer]tn. Please follow instruction sheet, as given.   Follow-Up: To be determined based on cath results.  Other Instructions  [facility] OFFICE [place], [place] [address] Dept: [phone] Loc: [phone]  [name], MD  09/30/2020  You are scheduled for a Cardiac Catheterization on Tuesday, August 9 with Dr. Sherren Mocha.  1. Please arrive at the [facility] (Main Entrance A) at [facility]: [address] [place], [place] [address] at 9:00 AM (This time is two hours before your procedure to ensure your preparation). Free valet parking service is available.   Special note: Every effort is made to have your procedure done on time. Please understand that emergencies sometimes delay scheduled procedures.  2. Diet: Do not eat solid foods after midnight.  The patient may have clear liquids until 5am upon the day of the procedure.  3. Labs: completed 09/30/2020  4. Medication instructions in preparation for your procedure:   Contrast Allergy: Yes, Please take  Prednisone '50mg'$  by mouth at: Thirteen hours prior to cath 10:00pm on Monday  10/06/20 Seven hours prior to cath 4:00am on Tuesday 10/07/20 And prior to leaving home please take last dose of Prednisone '50mg'$  and Benadryl '50mg'$  by mouth.   Do not take your Eliquis after Saturday 10/04/20 until you are told to restart after cath. Do not take your usual dose of Prednisone on the morning of cath (5 mg) On the morning of your procedure, take your Aspirin and any morning medicines NOT listed above.  You may use sips of water.  5. Plan for one night stay--bring personal belongings. 6. Bring a current list of your medications and current insurance cards. 7. You MUST have a responsible person to drive you home. 8. Someone MUST be with you the first 24 hours after you arrive home or your discharge will be delayed. 9. Please wear clothes that are easy to get on and off and wear slip-on shoes.  Thank you for allowing [place] to care for you!   --  Invasive Cardiovascular services

## 2020-10-01 LAB — BASIC METABOLIC PANEL
BUN/Creatinine Ratio: 19 (ref 10–24)
BUN: 18 mg/dL (ref 8–27)
CO2: 23 mmol/L (ref 20–29)
Calcium: 9.4 mg/dL (ref 8.6–10.2)
Chloride: 98 mmol/L (ref 96–106)
Creatinine, Ser: 0.97 mg/dL (ref 0.76–1.27)
Glucose: 103 mg/dL — ABNORMAL HIGH (ref 65–99)
Potassium: 4.3 mmol/L (ref 3.5–5.2)
Sodium: 139 mmol/L (ref 134–144)
eGFR: 77 mL/min/{1.73_m2} (ref >59)

## 2020-10-01 LAB — CBC
Hematocrit: 34.9 % — ABNORMAL LOW (ref 37.5–51.0)
Hemoglobin: 11.5 g/dL — ABNORMAL LOW (ref 13.0–17.7)
MCH: 28.6 pg (ref 26.6–33.0)
MCHC: 33 g/dL (ref 31.5–35.7)
MCV: 87 fL (ref 79–97)
Platelets: 192 10*3/uL (ref 150–450)
RBC: 4.02 x10E6/uL — ABNORMAL LOW (ref 4.14–5.80)
RDW: 13.2 % (ref 11.6–15.4)
WBC: 6.5 10*3/uL (ref 3.4–10.8)

## 2020-10-06 ENCOUNTER — Telehealth: Payer: Self-pay | Admitting: *Deleted

## 2020-10-06 NOTE — Telephone Encounter (Signed)
Cardiac catheterization scheduled at [facility] for: Tuesday October 07, 2020 [facility] Main Entrance A [facility]) at: 9 AM   No solid food after midnight prior to cath, clear liquids until 5 AM day of procedure.  CONTRAST ALLERGY: yes 13 hour Prednisone and Benadryl Prep reviewed with patient: 10/06/20 Prednisone 50 mg 10 PM 10/07/20 Prednisone 50 mg 4 AM 10/07/20 Prednisone 50 mg and Benadryl 50 mg just prior to leaving for hospital AM of procedure.  Medication instructions: Hold: -Eliquis-none 10/05/20 until post procedure  Except hold medications AM meds can be  taken pre-cath with sips of water including: -aspirin 81 mg -Prednisone 50 mg and Benadryl 50 mg-pt advised not to drive.    Confirmed patient has responsible adult to drive home post procedure and be with patient first 24 hours after arriving home.  Patients are allowed one visitor in the waiting room during the time they are at the hospital for their procedure. Both patient and visitor must wear a mask once they enter the hospital.   Patient reports does not currently have any symptoms concerning for COVID-19 and no household members with COVID-19 like illness.      Reviewed procedure/mask/visitor instructions with patient.

## 2020-10-07 ENCOUNTER — Ambulatory Visit (HOSPITAL_COMMUNITY)
Admission: RE | Admit: 2020-10-07 | Discharge: 2020-10-07 | Disposition: A | Discharge status: Home / Self Care | Payer: Medicare Other | Attending: Cardiovascular Disease | Admitting: Cardiovascular Disease

## 2020-10-07 ENCOUNTER — Other Ambulatory Visit: Payer: Self-pay

## 2020-10-07 ENCOUNTER — Encounter (HOSPITAL_COMMUNITY): Payer: Self-pay | Admitting: Cardiovascular Disease

## 2020-10-07 ENCOUNTER — Encounter (HOSPITAL_COMMUNITY)
Admission: RE | Disposition: A | Discharge status: Home / Self Care | Payer: Self-pay | Source: Home / Self Care | Attending: Cardiovascular Disease

## 2020-10-07 ENCOUNTER — Telehealth: Payer: Self-pay | Admitting: Cardiovascular Disease

## 2020-10-07 DIAGNOSIS — Z79899 Other long term (current) drug therapy: Secondary | ICD-10-CM | POA: Diagnosis not present

## 2020-10-07 DIAGNOSIS — Z7901 Long term (current) use of anticoagulants: Secondary | ICD-10-CM | POA: Diagnosis not present

## 2020-10-07 DIAGNOSIS — I5032 Chronic diastolic (congestive) heart failure: Secondary | ICD-10-CM | POA: Insufficient documentation

## 2020-10-07 DIAGNOSIS — I4891 Unspecified atrial fibrillation: Secondary | ICD-10-CM | POA: Insufficient documentation

## 2020-10-07 DIAGNOSIS — I484 Atypical atrial flutter: Secondary | ICD-10-CM | POA: Diagnosis not present

## 2020-10-07 DIAGNOSIS — Z953 Presence of xenogenic heart valve: Secondary | ICD-10-CM | POA: Insufficient documentation

## 2020-10-07 DIAGNOSIS — Z888 Allergy status to other drugs, medicaments and biological substances status: Secondary | ICD-10-CM | POA: Diagnosis not present

## 2020-10-07 DIAGNOSIS — I110 Hypertensive heart disease with heart failure: Secondary | ICD-10-CM | POA: Insufficient documentation

## 2020-10-07 DIAGNOSIS — I25118 Atherosclerotic heart disease of native coronary artery with other forms of angina pectoris: Secondary | ICD-10-CM | POA: Diagnosis not present

## 2020-10-07 DIAGNOSIS — Z955 Presence of coronary angioplasty implant and graft: Secondary | ICD-10-CM | POA: Insufficient documentation

## 2020-10-07 DIAGNOSIS — Z91041 Radiographic dye allergy status: Secondary | ICD-10-CM | POA: Insufficient documentation

## 2020-10-07 HISTORY — PX: LEFT HEART CATH AND CORONARY ANGIOGRAPHY: CATH118249

## 2020-10-07 LAB — CBC
HCT: 37.3 % — ABNORMAL LOW (ref 39.0–52.0)
Hemoglobin: 12.1 g/dL — ABNORMAL LOW (ref 13.0–17.0)
MCH: 29.5 pg (ref 26.0–34.0)
MCHC: 32.4 g/dL (ref 30.0–36.0)
MCV: 91 fL (ref 80.0–100.0)
Platelets: 208 10*3/uL (ref 150–400)
RBC: 4.1 MIL/uL — ABNORMAL LOW (ref 4.22–5.81)
RDW: 14 % (ref 11.5–15.5)
WBC: 7.4 10*3/uL (ref 4.0–10.5)
nRBC: 0 % (ref 0.0–0.2)

## 2020-10-07 SURGERY — LEFT HEART CATH AND CORONARY ANGIOGRAPHY
Anesthesia: LOCAL

## 2020-10-07 MED ORDER — LIDOCAINE HCL (PF) 1 % IJ SOLN
INTRAMUSCULAR | Status: AC
  Filled 2020-10-07: qty 30

## 2020-10-07 MED ORDER — ACETAMINOPHEN 325 MG PO TABS: 650.0000 mg | ORAL_TABLET | ORAL | Status: DC | PRN

## 2020-10-07 MED ORDER — ONDANSETRON HCL 4 MG/2ML IJ SOLN: 4.0000 mg | Freq: Four times a day (QID) | INTRAMUSCULAR | Status: DC | PRN

## 2020-10-07 MED ORDER — LABETALOL HCL 5 MG/ML IV SOLN
INTRAVENOUS | Status: AC
  Filled 2020-10-07: qty 4

## 2020-10-07 MED ORDER — METOPROLOL TARTRATE 50 MG PO TABS
ORAL_TABLET | ORAL | Status: AC
  Administered 2020-10-07: 50 mg via ORAL
  Filled 2020-10-07: qty 1

## 2020-10-07 MED ORDER — IOHEXOL 350 MG/ML SOLN
INTRAVENOUS | Status: DC | PRN
  Administered 2020-10-07: 40 mL

## 2020-10-07 MED ORDER — LABETALOL HCL 5 MG/ML IV SOLN
INTRAVENOUS | Status: DC | PRN
  Administered 2020-10-07: 20 mg via INTRAVENOUS

## 2020-10-07 MED ORDER — FENTANYL CITRATE (PF) 100 MCG/2ML IJ SOLN: 25.0000 ug | INTRAMUSCULAR | Status: DC | PRN

## 2020-10-07 MED ORDER — HYDRALAZINE HCL 20 MG/ML IJ SOLN: 10.0000 mg | INTRAMUSCULAR | Status: DC | PRN

## 2020-10-07 MED ORDER — METOPROLOL TARTRATE 50 MG PO TABS
50.0000 mg | ORAL_TABLET | Freq: Once | ORAL | Status: AC
  Filled 2020-10-07: qty 1

## 2020-10-07 MED ORDER — MIDAZOLAM HCL 2 MG/2ML IJ SOLN
INTRAMUSCULAR | Status: AC
  Filled 2020-10-07: qty 2

## 2020-10-07 MED ORDER — FENTANYL CITRATE (PF) 100 MCG/2ML IJ SOLN
INTRAMUSCULAR | Status: AC
  Filled 2020-10-07: qty 2

## 2020-10-07 MED ORDER — LIDOCAINE HCL (PF) 1 % IJ SOLN
INTRAMUSCULAR | Status: DC | PRN
  Administered 2020-10-07: 2 mL

## 2020-10-07 MED ORDER — HEPARIN (PORCINE) IN NACL 1000-0.9 UT/500ML-% IV SOLN
INTRAVENOUS | Status: AC
  Filled 2020-10-07: qty 500

## 2020-10-07 MED ORDER — SODIUM CHLORIDE 0.9 % WEIGHT BASED INFUSION
3.0000 mL/kg/h | INTRAVENOUS | Status: AC
  Administered 2020-10-07: 3 mL/kg/h via INTRAVENOUS

## 2020-10-07 MED ORDER — HEPARIN SODIUM (PORCINE) 1000 UNIT/ML IJ SOLN
INTRAMUSCULAR | Status: DC | PRN
  Administered 2020-10-07: 5000 [IU] via INTRAVENOUS

## 2020-10-07 MED ORDER — FENTANYL CITRATE (PF) 100 MCG/2ML IJ SOLN
INTRAMUSCULAR | Status: DC | PRN
  Administered 2020-10-07: 25 ug via INTRAVENOUS

## 2020-10-07 MED ORDER — SODIUM CHLORIDE 0.9 % IV SOLN: 250.0000 mL | INTRAVENOUS | Status: DC | PRN

## 2020-10-07 MED ORDER — LABETALOL HCL 5 MG/ML IV SOLN: 10.0000 mg | INTRAVENOUS | Status: DC | PRN

## 2020-10-07 MED ORDER — VERAPAMIL HCL 2.5 MG/ML IV SOLN
INTRAVENOUS | Status: AC
  Filled 2020-10-07: qty 2

## 2020-10-07 MED ORDER — SODIUM CHLORIDE 0.9% FLUSH: 3.0000 mL | INTRAVENOUS | Status: DC | PRN

## 2020-10-07 MED ORDER — SODIUM CHLORIDE 0.9% FLUSH: 3.0000 mL | Freq: Two times a day (BID) | INTRAVENOUS | Status: DC

## 2020-10-07 MED ORDER — HEPARIN (PORCINE) IN NACL 1000-0.9 UT/500ML-% IV SOLN
INTRAVENOUS | Status: DC | PRN
  Administered 2020-10-07 (×2): 500 mL

## 2020-10-07 MED ORDER — ASPIRIN 81 MG PO CHEW: 81.0000 mg | CHEWABLE_TABLET | ORAL | Status: DC

## 2020-10-07 MED ORDER — SODIUM CHLORIDE 0.9 % IV SOLN: INTRAVENOUS | Status: DC

## 2020-10-07 MED ORDER — SODIUM CHLORIDE 0.9 % WEIGHT BASED INFUSION: 1.0000 mL/kg/h | INTRAVENOUS | Status: DC

## 2020-10-07 MED ORDER — HEPARIN SODIUM (PORCINE) 1000 UNIT/ML IJ SOLN
INTRAMUSCULAR | Status: AC
  Filled 2020-10-07: qty 1

## 2020-10-07 MED ORDER — VERAPAMIL HCL 2.5 MG/ML IV SOLN
INTRAVENOUS | Status: DC | PRN
  Administered 2020-10-07: 10 mL via INTRA_ARTERIAL

## 2020-10-07 MED ORDER — MIDAZOLAM HCL 2 MG/2ML IJ SOLN
INTRAMUSCULAR | Status: DC | PRN
  Administered 2020-10-07: 1 mg via INTRAVENOUS

## 2020-10-07 SURGICAL SUPPLY — 10 items
CATH 5FR JL3.5 JR4 ANG PIG MP (CATHETERS) ×1 IMPLANT
CATH INFINITI 5FR JL4 (CATHETERS) ×1 IMPLANT
DEVICE RAD COMP TR BAND LRG (VASCULAR PRODUCTS) ×1 IMPLANT
GLIDESHEATH SLEND SS 6F .021 (SHEATH) ×1 IMPLANT
GUIDEWIRE INQWIRE 1.5J.035X260 (WIRE) IMPLANT
INQWIRE 1.5J .035X260CM (WIRE) ×2
KIT HEART LEFT (KITS) ×2 IMPLANT
PACK CARDIAC CATHETERIZATION (CUSTOM PROCEDURE TRAY) ×2 IMPLANT
TRANSDUCER W/STOPCOCK (MISCELLANEOUS) ×2 IMPLANT
TUBING CIL FLEX 10 FLL-RA (TUBING) ×2 IMPLANT

## 2020-10-07 NOTE — Telephone Encounter (Signed)
Per Dr. Burt Knack, he called patient and he would call him back.

## 2020-10-07 NOTE — Progress Notes (Signed)
Pt c/o 6/10 chest pain that radiates down left arm s/p heart catheterization. Pt states this is the same type of pain he experienced at home prior to procedure. Pt requested EKG. Dr. Burt Knack notified and orders for fentanyl and metoprolol placed. Metoprolol given per order.

## 2020-10-07 NOTE — Progress Notes (Signed)
Discharge instructions reviewed with pt, his wife, and brothers. All voice understanding.

## 2020-10-07 NOTE — Interval H&P Note (Signed)
Cath Lab Visit (complete for each Cath Lab visit)  Clinical Evaluation Leading to the Procedure:   ACS: No.  Non-ACS:    Anginal Classification: CCS III  Anti-ischemic medical therapy: Minimal Therapy (1 class of medications)  Non-Invasive Test Results: No non-invasive testing performed  Prior CABG: No previous CABG      History and Physical Interval Note:  10/07/2020 11:22 AM  [name], MD  has presented today for surgery, with the diagnosis of chest pain - cad.  The various methods of treatment have been discussed with the patient and family. After consideration of risks, benefits and other options for treatment, the patient has consented to  Procedure(s): LEFT HEART CATH AND CORONARY ANGIOGRAPHY (N/A) as a surgical intervention.  The patient's history has been reviewed, patient examined, no change in status, stable for surgery.  I have reviewed the patient's chart and labs.  Questions were answered to the patient's satisfaction.     Sherren Mocha

## 2020-10-07 NOTE — Telephone Encounter (Signed)
   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 Antionette Char nurse and to call him back as soon as possible

## 2020-10-07 NOTE — Discharge Instructions (Signed)
Radial Site Care  This sheet gives you information about how to care for yourself after your procedure. Your health care provider may also give you more specific instructions. If you have problems or questions, contact your health care provider. What can I expect after the procedure? After the procedure, it is common to have: Bruising and tenderness at the catheter insertion area. Follow these instructions at home: Medicines Take over-the-counter and prescription medicines only as told by your health care provider. Insertion site care Follow instructions from your health care provider about how to take care of your insertion site. Make sure you: Wash your hands with soap and water before you remove your bandage (dressing). If soap and water are not available, use hand sanitizer. May remove dressing in 24 hours. Check your insertion site every day for signs of infection. Check for: Redness, swelling, or pain. Fluid or blood. Pus or a bad smell. Warmth. Do no take baths, swim, or use a hot tub for 5 days. You may shower 24-48 hours after the procedure. Remove the dressing and gently wash the site with plain soap and water. Pat the area dry with a clean towel. Do not rub the site. That could cause bleeding. Do not apply powder or lotion to the site. Activity  For 24 hours after the procedure, or as directed by your health care provider: Do not flex or bend the affected arm. Do not push or pull heavy objects with the affected arm. Do not drive yourself home from the hospital or clinic. You may drive 24 hours after the procedure. Do not operate machinery or power tools. KEEP ARM ELEVATED THE REMAINDER OF THE DAY. Do not push, pull or lift anything that is heavier than 10 lb for 5 days. Ask your health care provider when it is okay to: Return to work or school. Resume usual physical activities or sports. Resume sexual activity. General instructions If the catheter site starts to  bleed, raise your arm and put firm pressure on the site. If the bleeding does not stop, get help right away. This is a medical emergency. DRINK PLENTY OF FLUIDS FOR THE NEXT 2-3 DAYS. No alcohol consumption for 24 hours after receiving sedation. If you went home on the same day as your procedure, a responsible adult should be with you for the first 24 hours after you arrive home. Keep all follow-up visits as told by your health care provider. This is important. Contact a health care provider if: You have a fever. You have redness, swelling, or yellow drainage around your insertion site. Get help right away if: You have unusual pain at the radial site. The catheter insertion area swells very fast. The insertion area is bleeding, and the bleeding does not stop when you hold steady pressure on the area. Your arm or hand becomes pale, cool, tingly, or numb. These symptoms may represent a serious problem that is an emergency. Do not wait to see if the symptoms will go away. Get medical help right away. Call your local emergency services (911 in the [place] Do not drive yourself to the hospital. Summary After the procedure, it is common to have bruising and tenderness at the site. Follow instructions from your health care provider about how to take care of your radial site wound. Check the wound every day for signs of infection.  This information is not intended to replace advice given to you by your health care provider. Make sure you discuss any questions you have with   your health care provider. Document Revised: 03/23/2017 Document Reviewed: 03/23/2017 Elsevier Patient Education  2020 Elsevier Inc.  

## 2020-10-10 ENCOUNTER — Ambulatory Visit (INDEPENDENT_AMBULATORY_CARE_PROVIDER_SITE_OTHER): Payer: Medicare Other | Admitting: Internal Medicine

## 2020-10-10 ENCOUNTER — Other Ambulatory Visit (HOSPITAL_COMMUNITY): Payer: Self-pay

## 2020-10-10 ENCOUNTER — Other Ambulatory Visit: Payer: Self-pay

## 2020-10-10 VITALS — BP 118/68 | HR 86 | Ht 73.0 in | Wt 228.0 lb

## 2020-10-10 DIAGNOSIS — I498 Other specified cardiac arrhythmias: Secondary | ICD-10-CM

## 2020-10-10 DIAGNOSIS — I25119 Atherosclerotic heart disease of native coronary artery with unspecified angina pectoris: Secondary | ICD-10-CM

## 2020-10-10 DIAGNOSIS — I495 Sick sinus syndrome: Secondary | ICD-10-CM | POA: Diagnosis not present

## 2020-10-10 DIAGNOSIS — I5022 Chronic systolic (congestive) heart failure: Secondary | ICD-10-CM

## 2020-10-10 DIAGNOSIS — I4819 Other persistent atrial fibrillation: Secondary | ICD-10-CM

## 2020-10-10 DIAGNOSIS — Z950 Presence of cardiac pacemaker: Secondary | ICD-10-CM | POA: Diagnosis not present

## 2020-10-10 MED ORDER — DIGOXIN 125 MCG PO TABS
0.1250 mg | ORAL_TABLET | Freq: Every day | ORAL | 3 refills | Status: DC
  Filled 2020-10-10: qty 90, 90d supply, fill #0

## 2020-10-10 NOTE — Patient Instructions (Addendum)
Medication Instructions:  Your physician has recommended you make the following change in your medication:    START taking digoxin 0.125 mg -  Take one tablet by mouth daily   Labwork: You will come for lab work on October 31, 2020- dig level  Testing/Procedures: None ordered.  Follow-Up: Your physician wants you to follow-up in: 6 weeks with Cristopher Peru, MD   December 02, 2020 at 2:15 pm  Remote monitoring is used to monitor your Pacemaker from home. This monitoring reduces the number of office visits required to check your device to one time per year. It allows [place] to keep an eye on the functioning of your device to ensure it is working properly. You are scheduled for a device check from home on 11/07/2020. You may send your transmission at any time that day. If you have a wireless device, the transmission will be sent automatically. After your physician reviews your transmission, you will receive a postcard with your next transmission date.  Any Other Special Instructions Will Be Listed Below (If Applicable).  If you need a refill on your cardiac medications before your next appointment, please call your pharmacy.

## 2020-10-10 NOTE — Progress Notes (Signed)
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 He is a pleasant 86 yo man with a long past medical history with CAD, AS, s/p AVR, persistent left atrial flutter, HTN, and sinus node dysfunction. He underwent PCI of the RCA 12 months ago and because of worsening angina, underwent repeat cath and his stents were found to be open. On the table and despite high dose metoprolol, he had runs of atrial flutter with 1:1 AV conduction and subsequent drop in his bp into the 70's. He has been over 100/min about 20% of the time.Marland Kitchen He notes exertional angina which has responded to slntg. He denies non-compliance. He also c/o fatigue and weakness. No syncope.   Allergies  Allergen Reactions   Contrast Media [Iodinated Diagnostic Agents] Hives   Gadolinium Derivatives Hives   Metrizamide Hives     Current Outpatient Medications  Medication Sig Dispense Refill   amoxicillin (AMOXIL) 500 MG capsule TAKE 4 CAPSULES BY MOUTH 1 HOUR PRIOR TO DENTAL APPT (Patient taking differently: Take 2,000 mg by mouth once.) 20 capsule 99   apixaban (ELIQUIS) 5 MG TABS tablet TAKE 1 TABLET BY MOUTH 2 TIMES DAILY. (Patient taking differently: Take 5 mg by mouth 2 (two) times daily.) 180 tablet 1   bisacodyl (DULCOLAX) 5 MG EC tablet Take 5 mg by mouth every other day.     butalbital-acetaminophen-caffeine (FIORICET) 50-325-40 MG tablet TAKE 1 TABLET BY MOUTH TWO TIMES DAILY AS NEEDED FOR TENSION HEADACHES AS DIRECTED (Patient taking differently: Take 1 tablet by mouth 2 (two) times daily.) 180 tablet 0   butalbital-acetaminophen-caffeine (FIORICET, ESGIC) 50-325-40 MG per tablet Take 1 tablet by mouth 2 (two) times daily.     Cholecalciferol (VITAMIN D3) 2000 units capsule Take 2,000 Units by mouth daily.     Coenzyme Q10 200 MG TABS Take 200 mg by mouth daily.     diclofenac Sodium (VOLTAREN) 1 % GEL Apply 2 g topically 2 (two) times daily as needed (knee pain).     digoxin (LANOXIN) 0.125 MG  tablet Take 1 tablet (0.125 mg total) by mouth daily. 90 tablet 3   diltiazem (CARDIZEM) 30 MG tablet Take 30 mg by mouth as needed (heart rate).     doxazosin (CARDURA) 4 MG tablet TAKE 1 TABLET BY MOUTH ONCE A DAY (Patient taking differently: Take 4 mg by mouth at bedtime.) 90 tablet 2   ezetimibe (ZETIA) 10 MG tablet Take 1 tablet (10 mg total) by mouth daily. 90 tablet 3   HYDROcodone-acetaminophen (NORCO/VICODIN) 5-325 MG tablet TAKE 1 TABLET BY MOUTH 3 TIMES DAILY AS NEEDED FOR MODERATE PAIN (Patient taking differently: Take 1 tablet by mouth 3 (three) times daily as needed for moderate pain or severe pain.) 90 tablet 0   LORazepam (ATIVAN) 1 MG tablet TAKE 1 TABLET BY MOUTH AT BEDTIME. MAY ALSO TAKE 1/2 TABLET DAILY AS NEEDED FOR ANXIETY. (Patient taking differently: Take 1 mg by mouth at bedtime.) 45 tablet 5   magnesium oxide (MAG-OX) 400 MG tablet Take 400 mg by mouth daily.     metoprolol succinate (TOPROL-XL) 100 MG 24 hr tablet Take 1 tablet by mouth twice daily (Patient taking differently: Take 100 mg by mouth 2 (two) times daily.) 180 tablet 3   Multiple Vitamins-Minerals (MULTIVITAMIN WITH MINERALS) tablet Take 1 tablet by mouth daily.     nitroGLYCERIN (NITROSTAT) 0.4 MG SL tablet PLACE 1 TABLET UNDER THE TONGUE EVERY 5 MINUTES AS NEEDED FOR CHEST PAIN. (Patient  taking differently: Place 0.4 mg under the tongue every 5 (five) minutes as needed for chest pain.) 25 tablet 5   OVER THE COUNTER MEDICATION Apply 1 patch topically daily as needed (neck and shoulder pain). Chinese pain patch     pantoprazole (PROTONIX) 40 MG tablet TAKE 1 TABLET (40 MG TOTAL) BY MOUTH DAILY. (Patient taking differently: Take 40 mg by mouth daily.) 90 tablet 3   polyethylene glycol (MIRALAX / GLYCOLAX) 17 g packet Take 17 g by mouth daily as needed for mild constipation.      potassium chloride SA (KLOR-CON) 20 MEQ tablet TAKE 1 TABLET BY MOUTH DAILY. (Patient taking differently: Take 20 mEq by mouth daily.) 90  tablet 3   predniSONE (DELTASONE) 10 MG tablet Take 1 tablet (10 mg total) by mouth daily with breakfast. (Patient taking differently: Take 5 mg by mouth daily with breakfast.) 100 tablet 3   predniSONE (DELTASONE) 50 MG tablet Take as directed prior to procedure 3 tablet 0   rosuvastatin (CRESTOR) 20 MG tablet TAKE 1 TABLET (20 MG TOTAL) BY MOUTH AT BEDTIME. (Patient taking differently: Take 20 mg by mouth at bedtime.) 90 tablet 3   No current facility-administered medications for this visit.     Past Medical History:  Diagnosis Date   Aortic stenosis    moderate aortic stenosis   Arthritis    Benign prostatic hypertrophy    Chronic systolic heart failure (HCC)    Chronotropic incompetence with sinus node dysfunction (HCC)    Status post Guidant dual-mode, dual-pacing, dual-sensing  pacemaker   implantation now programmed to AAI with recent generator change.   Coronary artery disease    status post multiple prior percutaneous coronary interventions, microvascular angina per Dr Olevia Perches   Diverticulitis sigmoid colon recurrent    Dysfunctional autonomic nervous system    Dyspnea    Heart murmur    History of primary hypertension    Hypercoagulable state ([place])    chronically anticoagulated with coumadin   Hyperlipidemia    Hyperthyroidism    Hypothyroidism    Dr. 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 Lia Foyer,    Prediabetes 09/21/2017   Stroke ([place])    1990    ROS:   All systems reviewed and negative except as noted in the HPI.   Past Surgical History:  Procedure Laterality Date   AORTIC VALVE REPLACEMENT  03/15/2011   Procedure: AORTIC VALVE REPLACEMENT (AVR);  Surgeon: Gaye Pollack, MD;  Location: [facility];  Service: Open Heart Surgery;  Laterality: N/A;   APPENDECTOMY     CARDIAC CATHETERIZATION     11   CARDIOVERSION     CARDIOVERSION   04/15/2011   Procedure: CARDIOVERSION;  Surgeon: Loralie Champagne, MD;  Location: [facility];  Service: Cardiovascular;  Laterality: N/A;   CARDIOVERSION N/A 09/11/2014   Procedure: CARDIOVERSION;  Surgeon: Sueanne Margarita, MD;  Location: [facility];  Service: Cardiovascular;  Laterality: N/A;   CARDIOVERSION N/A 06/27/2015   Procedure: CARDIOVERSION;  Surgeon: Thayer Headings, MD;  Location: [facility];  Service: Cardiovascular;  Laterality: N/A;   CARDIOVERSION N/A 07/04/2015   Procedure: CARDIOVERSION;  Surgeon: Evans Lance, MD;  Location: [facility];  Service: Cardiovascular;  Laterality: N/A;   CARDIOVERSION N/A 04/13/2017   Procedure: CARDIOVERSION;  Surgeon: Sueanne Margarita, MD;  Location: [facility]COPY;  Service: Cardiovascular;  Laterality:  N/A;   COLONOSCOPY     CORONARY STENT INTERVENTION N/A 10/15/2019   Procedure: CORONARY STENT INTERVENTION;  Surgeon: Jettie Booze, MD;  Location: [facility] CV LAB;  Service: Cardiovascular;  Laterality: N/A;   EP IMPLANTABLE DEVICE N/A 06/16/2015   Procedure: Pacemaker Implant;  Surgeon: Evans Lance, MD;  Location: [facility] CV LAB;  Service: Cardiovascular;  Laterality: N/A;   hemrrhoidectomy     LEFT AND RIGHT HEART CATHETERIZATION WITH CORONARY ANGIOGRAM Bilateral 02/01/2011   Procedure: LEFT AND RIGHT HEART CATHETERIZATION WITH CORONARY ANGIOGRAM;  Surgeon: Hillary Bow, MD;  Location: [facility] CATH LAB;  Service: Cardiovascular;  Laterality: Bilateral;   LEFT HEART CATH AND CORONARY ANGIOGRAPHY N/A 10/15/2019   Procedure: LEFT HEART CATH AND CORONARY ANGIOGRAPHY;  Surgeon: Jettie Booze, MD;  Location: [facility] CV LAB;  Service: Cardiovascular;  Laterality: N/A;   LEFT HEART CATH AND CORONARY ANGIOGRAPHY N/A 10/07/2020   Procedure: LEFT HEART CATH AND CORONARY ANGIOGRAPHY;  Surgeon: Sherren Mocha, MD;  Location: [facility] CV LAB;  Service: Cardiovascular;  Laterality: N/A;   MAZE  03/15/2011   Procedure: MAZE;  Surgeon:  Gaye Pollack, MD;  Location: [facility];  Service: Open Heart Surgery;  Laterality: N/A;   PACEMAKER INSERTION  1991   Guidant PPM, most recent Generator Change by Dr Olevia Perches was 08/22/06   RIGHT/LEFT HEART CATH AND CORONARY ANGIOGRAPHY N/A 07/06/2016   Procedure: Right/Left Heart Cath and Coronary Angiography;  Surgeon: Sherren Mocha, MD;  Location: [facility] CV LAB;  Service: Cardiovascular;  Laterality: N/A;   TEE WITHOUT CARDIOVERSION  04/15/2011   Procedure: TRANSESOPHAGEAL ECHOCARDIOGRAM (TEE);  Surgeon: Loralie Champagne, MD;  Location: [facility];  Service: Cardiovascular;  Laterality: N/A;   TEE WITHOUT CARDIOVERSION N/A 09/11/2014   Procedure: TRANSESOPHAGEAL ECHOCARDIOGRAM (TEE);  Surgeon: Sueanne Margarita, MD;  Location: [facility] ENDOSCOPY;  Service: Cardiovascular;  Laterality: N/A;     Family History  Problem Relation Age of Onset   Heart disease Brother        Twin brother has coronary disease and recent AVR for AS   CAD Brother    Atrial fibrillation Brother    Sarcoidosis Brother    Depression Daughter    Anorexia nervosa Daughter    Hypertension Son    Anesthesia problems Neg Hx    Hypotension Neg Hx    Malignant hyperthermia Neg Hx    Pseudochol deficiency Neg Hx      Social History   Socioeconomic History   Marital status: Married    Spouse name: Baker Janus    Number of children: 3   Years of education: Not on file   Highest education level: Not on file  Occupational History   Occupation: Retired    Comment: Physician  Tobacco Use   Smoking status: Never   Smokeless tobacco: Never  Vaping Use   Vaping Use: Never used  Substance and Sexual Activity   Alcohol use: No    Alcohol/week: 0.0 standard drinks   Drug use: No   Sexual activity: Not Currently  Other Topics Concern   Not on file  Social History Narrative   Married to Perrin. Has grown children   Retired Horticulturist, commercial MD      Never smoker no alcohol      Social Determinants of Radio broadcast assistant  Strain: Not on file  Food Insecurity: Not on file  Transportation Needs: Not on file  Physical Activity: Not on file  Stress: Not on file  Social Connections: Not on file  Intimate Partner Violence: Not on file     BP 118/68   Pulse 86   Ht '6\' 1"'$  (1.854 m)   Wt 228 lb (103.4 kg)   SpO2 94%   BMI 30.08 kg/m   Physical Exam:  Well appearing NAD HEENT: Unremarkable Neck:  No JVD, no thyromegally Lymphatics:  No adenopathy Back:  No CVA tenderness Lungs:  Clear HEART:  Regular rate rhythm, no murmurs, no rubs, no clicks Abd:  soft, positive bowel sounds, no organomegally, no rebound, no guarding Ext:  2 plus pulses, no edema, no cyanosis, no clubbing Skin:  No rashes no nodules Neuro:  CN II through XII intact, motor grossly intact  EKG - reviewed. Atrial flutter with a controlled VR  DEVICE  Normal device function.  See PaceArt for details.   Assess/Plan:  1. Left atrial flutter - he has been treated in the past with dofetilide and returned to NSR but did not feel any better so we stopped the medication. His rates are not well controlled. I asked him to continue toprol 100 bid and to start digoxin 0.125 mg daily. We will have him return for a dig level in 3-4 weeks and I will see him back after to adjust his meds. We also discussed AV node ablation as well as left atrial flutter ablation.  2. CAD - he is having angina. He will continue rate control and slntg. I will reach to Dr. Billee Cashing.  3. PPM - his boston Sci DDD PM is working normally. No change. 4. HTN - his bp is controlled. We will follow.   Carleene Overlie Grae Cannata,MD

## 2020-10-13 ENCOUNTER — Other Ambulatory Visit (HOSPITAL_COMMUNITY): Payer: Self-pay

## 2020-10-20 ENCOUNTER — Other Ambulatory Visit (HOSPITAL_COMMUNITY): Payer: Self-pay

## 2020-10-20 ENCOUNTER — Other Ambulatory Visit (HOSPITAL_BASED_OUTPATIENT_CLINIC_OR_DEPARTMENT_OTHER): Payer: Self-pay

## 2020-10-20 ENCOUNTER — Other Ambulatory Visit: Payer: Self-pay | Admitting: Rheumatology

## 2020-10-20 NOTE — Telephone Encounter (Signed)
Last Visit: 08/20/2020  Next Visit: 02/10/2021  UDS:08/20/2020 c/w  Narc Agreement: 08/20/2020   Last Fill: 09/29/2020   Okay to refill hydrocodone?

## 2020-10-21 ENCOUNTER — Other Ambulatory Visit: Payer: Self-pay | Admitting: Rheumatology

## 2020-10-21 ENCOUNTER — Telehealth: Payer: Self-pay | Admitting: Internal Medicine

## 2020-10-21 ENCOUNTER — Other Ambulatory Visit (HOSPITAL_BASED_OUTPATIENT_CLINIC_OR_DEPARTMENT_OTHER): Payer: Self-pay

## 2020-10-21 ENCOUNTER — Other Ambulatory Visit (HOSPITAL_COMMUNITY): Payer: Self-pay

## 2020-10-21 ENCOUNTER — Other Ambulatory Visit: Payer: Self-pay | Admitting: Cardiovascular Disease

## 2020-10-21 MED ORDER — PANTOPRAZOLE SODIUM 40 MG PO TBEC
DELAYED_RELEASE_TABLET | Freq: Every day | ORAL | 3 refills | Status: DC
  Filled 2020-10-21: qty 90, 90d supply, fill #0
  Filled 2021-01-20: qty 90, 90d supply, fill #1
  Filled 2021-04-20: qty 90, 90d supply, fill #2
  Filled 2021-06-30: qty 90, 90d supply, fill #3

## 2020-10-21 MED ORDER — HYDROCODONE-ACETAMINOPHEN 5-325 MG PO TABS
ORAL_TABLET | ORAL | 0 refills | Status: DC
  Filled 2020-10-21: qty 90, fill #0
  Filled 2020-11-06: qty 90, 30d supply, fill #0

## 2020-10-21 MED FILL — Rosuvastatin Calcium Tab 20 MG: ORAL | 90 days supply | Qty: 90 | Fill #0 | Status: AC

## 2020-10-21 NOTE — Telephone Encounter (Signed)
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 He says he is in Atrial Fib. He says Dr Lovena Le wants him to see Dr Quentin Ore. Dr Quentin Ore is here one day next week and he says he wants to be seen next week. He says he does not need to see Dr Lovena Le or an APP.   Patient c/o Palpitations:  High priority if patient c/o lightheadedness, shortness of breath, or chest pain  How long have you had palpitations/irregular HR/ Afib? Are you having the symptoms now?  He is in Afibs  Are you currently experiencing lightheadedness, SOB or CP?  yes  Do you have a history of afib (atrial fibrillation) or irregular heart rhythm? yes  Have you checked your BP or HR? (document readings if available):   Are you experiencing any other symptoms?

## 2020-10-21 NOTE — Telephone Encounter (Signed)
Will forward to [place] appears Dr Quentin Ore is in office on 8/30 Last office note Dr Taylor(10/10/20) discussed possible AV node ablation or a flutter ablation./cy

## 2020-10-21 NOTE — Telephone Encounter (Signed)
Last Visit: 08/20/2020   Next Visit: 02/10/2021   UDS:08/20/2020 c/w   Narc Agreement: 08/20/2020   Last Fill: 09/29/2020   Okay to refill hydrocodone?

## 2020-10-22 ENCOUNTER — Other Ambulatory Visit (HOSPITAL_COMMUNITY): Payer: Self-pay

## 2020-10-22 NOTE — Telephone Encounter (Signed)
Per Dr. Corliss Parish to schedule OV with Dr. Quentin Ore to discuss afib ablation.  Pt scheduled.

## 2020-10-25 ENCOUNTER — Encounter (HOSPITAL_BASED_OUTPATIENT_CLINIC_OR_DEPARTMENT_OTHER): Payer: Self-pay | Admitting: Cardiology

## 2020-10-31 ENCOUNTER — Other Ambulatory Visit: Payer: Self-pay | Admitting: *Deleted

## 2020-10-31 ENCOUNTER — Telehealth: Payer: Self-pay | Admitting: *Deleted

## 2020-10-31 ENCOUNTER — Other Ambulatory Visit: Payer: Self-pay

## 2020-10-31 ENCOUNTER — Ambulatory Visit (INDEPENDENT_AMBULATORY_CARE_PROVIDER_SITE_OTHER): Payer: Medicare Other

## 2020-10-31 ENCOUNTER — Other Ambulatory Visit: Payer: Medicare Other | Admitting: *Deleted

## 2020-10-31 DIAGNOSIS — Z79899 Other long term (current) drug therapy: Secondary | ICD-10-CM | POA: Diagnosis not present

## 2020-10-31 DIAGNOSIS — I495 Sick sinus syndrome: Secondary | ICD-10-CM | POA: Diagnosis not present

## 2020-10-31 DIAGNOSIS — I498 Other specified cardiac arrhythmias: Secondary | ICD-10-CM

## 2020-10-31 DIAGNOSIS — I484 Atypical atrial flutter: Secondary | ICD-10-CM

## 2020-10-31 NOTE — Telephone Encounter (Signed)
Patient was in the office today getting labs and asked to speak to me.  He states he is to have a 3 day holter monitor prior to seeing Dr. Quentin Ore on 11/20/20.  There is no order in the chart. I reached out to Dr. Lovena Le for recommendations and he advised to order a 3 day ZIO.  Order placed, patient aware the results should be in prior to his appointment.  He was appreciative of my call and help.

## 2020-10-31 NOTE — Progress Notes (Unsigned)
Enrolled patient for a 3 day Zio XT monitor to be mailed to patients home  

## 2020-11-01 LAB — SPECIMEN STATUS REPORT

## 2020-11-01 LAB — DIGOXIN LEVEL: Digoxin, Serum: 0.8 ng/mL (ref 0.5–0.9)

## 2020-11-06 ENCOUNTER — Other Ambulatory Visit (HOSPITAL_COMMUNITY): Payer: Self-pay

## 2020-11-06 ENCOUNTER — Other Ambulatory Visit: Payer: Self-pay

## 2020-11-06 MED ORDER — DOXAZOSIN MESYLATE 4 MG PO TABS
ORAL_TABLET | ORAL | 3 refills | Status: DC
  Filled 2020-11-06 – 2021-01-28 (×3): qty 90, 90d supply, fill #0
  Filled 2021-04-28: qty 90, 90d supply, fill #1
  Filled 2021-07-30: qty 90, 90d supply, fill #2

## 2020-11-07 ENCOUNTER — Ambulatory Visit (INDEPENDENT_AMBULATORY_CARE_PROVIDER_SITE_OTHER): Payer: Medicare Other

## 2020-11-07 DIAGNOSIS — I4891 Unspecified atrial fibrillation: Secondary | ICD-10-CM

## 2020-11-07 LAB — CUP PACEART REMOTE DEVICE CHECK
Battery Remaining Longevity: 72 mo
Battery Remaining Percentage: 71 %
Brady Statistic RA Percent Paced: 0 %
Brady Statistic RV Percent Paced: 73 %
Date Time Interrogation Session: 20220909021100
Implantable Lead Implant Date: 20170417
Implantable Lead Implant Date: 20170417
Implantable Lead Location: 753859
Implantable Lead Location: 753860
Implantable Lead Model: 7740
Implantable Lead Model: 7741
Implantable Lead Serial Number: 662696
Implantable Lead Serial Number: 751382
Implantable Pulse Generator Implant Date: 20170417
Lead Channel Impedance Value: 646 Ohm
Lead Channel Impedance Value: 709 Ohm
Lead Channel Setting Pacing Amplitude: 2.5 V
Lead Channel Setting Pacing Amplitude: 2.5 V
Lead Channel Setting Pacing Pulse Width: 0.4 ms
Lead Channel Setting Sensing Sensitivity: 2.5 mV
Pulse Gen Serial Number: 718418

## 2020-11-10 DIAGNOSIS — I484 Atypical atrial flutter: Secondary | ICD-10-CM

## 2020-11-11 DIAGNOSIS — I484 Atypical atrial flutter: Secondary | ICD-10-CM | POA: Diagnosis not present

## 2020-11-13 NOTE — Progress Notes (Signed)
Remote pacemaker transmission.   

## 2020-11-17 ENCOUNTER — Other Ambulatory Visit (HOSPITAL_COMMUNITY): Payer: Self-pay

## 2020-11-17 ENCOUNTER — Other Ambulatory Visit: Payer: Self-pay | Admitting: Orthopedic Surgery

## 2020-11-17 DIAGNOSIS — Z950 Presence of cardiac pacemaker: Secondary | ICD-10-CM

## 2020-11-17 MED FILL — Lorazepam Tab 1 MG: ORAL | 30 days supply | Qty: 45 | Fill #0 | Status: AC

## 2020-11-17 NOTE — Telephone Encounter (Signed)
Patient has request refill on medication "Lorazepam 1mg ". Patient last refill was 07/01/2020. Patient medication pend and sent to Royal Hawthorn, NP for approval. I'm unable to make judgements on controlled medications. Please Advise.

## 2020-11-17 NOTE — Progress Notes (Signed)
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 Lovena Le.  He was last seen by Dr. Lovena Le October 10, 2020.  His medical history includes coronary artery disease, aortic stenosis post aortic valve replacement, persistent atypical atrial flutter, hypertension and sinus node dysfunction.  He has had previous PCI as recently as 2021.  He is maintained on Eliquis for stroke prophylaxis.  He has a permanent pacemaker that was implanted in 1991.  He had a generator replacement in 2017 by Dr. Lovena Le.  He had a maze operation by Dr. Cyndia Bent at the time of his aortic valve replacement in 2013.  He was previously trialed on dofetilide and successfully returned to normal rhythm initially but eventually had a recurrence of atypical atrial flutter.  Furthermore, he was unsure whether he felt any better so the medication was stopped.  He previously was on amiodarone but developed lung toxicity and thyroid toxicity so this medication was stopped.    Past Medical History:  Diagnosis Date   Aortic stenosis    moderate aortic stenosis   Arthritis    Benign prostatic hypertrophy    Chronic systolic heart failure (HCC)    Chronotropic incompetence with sinus node dysfunction (HCC)    Status post Guidant dual-mode, dual-pacing, dual-sensing  pacemaker   implantation now programmed to AAI with recent generator change.   Coronary artery disease    status post multiple prior percutaneous coronary interventions, microvascular angina per Dr Olevia Perches   Diverticulitis sigmoid colon recurrent    Dysfunctional autonomic nervous system    Dyspnea    Heart murmur    History of primary hypertension     Hypercoagulable state ([place])    chronically anticoagulated with coumadin   Hyperlipidemia    Hyperthyroidism    Hypothyroidism    Dr. 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 Lia Foyer,    Prediabetes 09/21/2017   Stroke ([place])    1990    Past Surgical History:  Procedure Laterality Date   AORTIC VALVE REPLACEMENT  03/15/2011   Procedure: AORTIC VALVE REPLACEMENT (AVR);  Surgeon: Gaye Pollack, MD;  Location: [place];  Service: Open Heart Surgery;  Laterality: N/A;   APPENDECTOMY     CARDIAC CATHETERIZATION     11   CARDIOVERSION     CARDIOVERSION  04/15/2011   Procedure: CARDIOVERSION;  Surgeon: Loralie Champagne, MD;  Location: [place];  Service: Cardiovascular;  Laterality: N/A;   CARDIOVERSION N/A 09/11/2014   Procedure: CARDIOVERSION;  Surgeon: Sueanne Margarita, MD;  Location: [place] ENDOSCOPY;  Service: Cardiovascular;  Laterality: N/A;   CARDIOVERSION N/A 06/27/2015   Procedure: CARDIOVERSION;  Surgeon: Thayer Headings, MD;  Location: [place];  Service: Cardiovascular;  Laterality: N/A;   CARDIOVERSION N/A 07/04/2015   Procedure: CARDIOVERSION;  Surgeon: Evans Lance, MD;  Location: [place];  Service: Cardiovascular;  Laterality: N/A;   CARDIOVERSION N/A 04/13/2017   Procedure: CARDIOVERSION;  Surgeon: Sueanne Margarita, MD;  Location: [facility] ENDOSCOPY;  Service: Cardiovascular;  Laterality: N/A;   COLONOSCOPY  CORONARY STENT INTERVENTION N/A 10/15/2019   Procedure: CORONARY STENT INTERVENTION;  Surgeon: Jettie Booze, MD;  Location: [place] CV LAB;  Service: Cardiovascular;  Laterality: N/A;   EP IMPLANTABLE DEVICE N/A 06/16/2015   Procedure: Pacemaker Implant;  Surgeon: Evans Lance, MD;  Location: [place] CV LAB;  Service: Cardiovascular;  Laterality: N/A;   hemrrhoidectomy     LEFT AND RIGHT HEART CATHETERIZATION WITH CORONARY ANGIOGRAM  Bilateral 02/01/2011   Procedure: LEFT AND RIGHT HEART CATHETERIZATION WITH CORONARY ANGIOGRAM;  Surgeon: Hillary Bow, MD;  Location: [facility] CATH LAB;  Service: Cardiovascular;  Laterality: Bilateral;   LEFT HEART CATH AND CORONARY ANGIOGRAPHY N/A 10/15/2019   Procedure: LEFT HEART CATH AND CORONARY ANGIOGRAPHY;  Surgeon: Jettie Booze, MD;  Location: [place] CV LAB;  Service: Cardiovascular;  Laterality: N/A;   LEFT HEART CATH AND CORONARY ANGIOGRAPHY N/A 10/07/2020   Procedure: LEFT HEART CATH AND CORONARY ANGIOGRAPHY;  Surgeon: Sherren Mocha, MD;  Location: [place] CV LAB;  Service: Cardiovascular;  Laterality: N/A;   MAZE  03/15/2011   Procedure: MAZE;  Surgeon: Gaye Pollack, MD;  Location: [place];  Service: Open Heart Surgery;  Laterality: N/A;   PACEMAKER INSERTION  1991   Guidant PPM, most recent Generator Change by Dr Olevia Perches was 08/22/06   RIGHT/LEFT HEART CATH AND CORONARY ANGIOGRAPHY N/A 07/06/2016   Procedure: Right/Left Heart Cath and Coronary Angiography;  Surgeon: Sherren Mocha, MD;  Location: [place] CV LAB;  Service: Cardiovascular;  Laterality: N/A;   TEE WITHOUT CARDIOVERSION  04/15/2011   Procedure: TRANSESOPHAGEAL ECHOCARDIOGRAM (TEE);  Surgeon: Loralie Champagne, MD;  Location: [place];  Service: Cardiovascular;  Laterality: N/A;   TEE WITHOUT CARDIOVERSION N/A 09/11/2014   Procedure: TRANSESOPHAGEAL ECHOCARDIOGRAM (TEE);  Surgeon: Sueanne Margarita, MD;  Location: [facility] ENDOSCOPY;  Service: Cardiovascular;  Laterality: N/A;    Current Medications: Current Meds  Medication Sig   amoxicillin (AMOXIL) 500 MG capsule TAKE 4 CAPSULES BY MOUTH 1 HOUR PRIOR TO DENTAL APPT (Patient taking differently: Take 2,000 mg by mouth once.)   apixaban (ELIQUIS) 5 MG TABS tablet TAKE 1 TABLET BY MOUTH 2 TIMES DAILY. (Patient taking differently: Take 5 mg by mouth 2 (two) times daily.)   bisacodyl (DULCOLAX) 5 MG EC tablet Take 5 mg by mouth every other day.    butalbital-acetaminophen-caffeine (FIORICET) 50-325-40 MG tablet TAKE 1 TABLET BY MOUTH TWO TIMES DAILY AS NEEDED FOR TENSION HEADACHES AS DIRECTED (Patient taking differently: Take 1 tablet by mouth 2 (two) times daily.)   butalbital-acetaminophen-caffeine (FIORICET, ESGIC) 50-325-40 MG per tablet Take 1 tablet by mouth 2 (two) times daily.   Cholecalciferol (VITAMIN D3) 2000 units capsule Take 2,000 Units by mouth daily.   Coenzyme Q10 200 MG TABS Take 200 mg by mouth daily.   diclofenac Sodium (VOLTAREN) 1 % GEL Apply 2 g topically 2 (two) times daily as needed (knee pain).   digoxin (LANOXIN) 0.125 MG tablet Take 1 tablet (0.125 mg total) by mouth daily.   diltiazem (CARDIZEM) 30 MG tablet Take 30 mg by mouth as needed (heart rate).   doxazosin (CARDURA) 4 MG tablet TAKE 1 TABLET BY MOUTH ONCE A DAY (Patient taking differently: Take 4 mg by mouth at bedtime.)   doxazosin (CARDURA) 4 MG tablet Take 1 tablet by mouth daily   ezetimibe (ZETIA) 10 MG tablet Take 1 tablet (10 mg total) by mouth daily.   HYDROcodone-acetaminophen (NORCO/VICODIN) 5-325 MG tablet TAKE 1 TABLET BY MOUTH 3 TIMES DAILY AS  NEEDED FOR MODERATE PAIN   LORazepam (ATIVAN) 1 MG tablet TAKE 1 TABLET BY MOUTH AT BEDTIME. MAY ALSO TAKE 1/2 TABLET DAILY AS NEEDED FOR ANXIETY.   magnesium oxide (MAG-OX) 400 MG tablet Take 400 mg by mouth daily.   metoprolol succinate (TOPROL-XL) 100 MG 24 hr tablet Take 1 tablet by mouth twice daily (Patient taking differently: Take 100 mg by mouth 2 (two) times daily.)   Multiple Vitamins-Minerals (MULTIVITAMIN WITH MINERALS) tablet Take 1 tablet by mouth daily.   OVER THE COUNTER MEDICATION Apply 1 patch topically daily as needed (neck and shoulder pain). Chinese pain patch   pantoprazole (PROTONIX) 40 MG tablet TAKE 1 TABLET BY MOUTH DAILY.   polyethylene glycol (MIRALAX / GLYCOLAX) 17 g packet Take 17 g by mouth daily as needed for mild constipation.    potassium chloride SA (KLOR-CON) 20 MEQ  tablet TAKE 1 TABLET BY MOUTH DAILY. (Patient taking differently: Take 20 mEq by mouth daily.)   predniSONE (DELTASONE) 10 MG tablet Take 1 tablet (10 mg total) by mouth daily with breakfast. (Patient taking differently: Take 5 mg by mouth daily with breakfast.)   predniSONE (DELTASONE) 50 MG tablet Take as directed prior to procedure   rosuvastatin (CRESTOR) 20 MG tablet TAKE 1 TABLET (20 MG TOTAL) BY MOUTH AT BEDTIME. (Patient taking differently: Take 20 mg by mouth at bedtime.)     Allergies:   Contrast media [iodinated diagnostic agents], Gadolinium derivatives, and Metrizamide   Social History   Socioeconomic History   Marital status: Married    Spouse name: Baker Janus    Number of children: 3   Years of education: Not on file   Highest education level: Not on file  Occupational History   Occupation: Retired    Comment: Physician  Tobacco Use   Smoking status: Never   Smokeless tobacco: Never  Vaping Use   Vaping Use: Never used  Substance and Sexual Activity   Alcohol use: No    Alcohol/week: 0.0 standard drinks   Drug use: No   Sexual activity: Not Currently  Other Topics Concern   Not on file  Social History Narrative   Married to [place]. Has grown children   Retired Horticulturist, commercial MD      Never smoker no alcohol      Social Determinants of Radio broadcast assistant Strain: Not on file  Food Insecurity: Not on file  Transportation Needs: Not on file  Physical Activity: Not on file  Stress: Not on file  Social Connections: Not on file     Family History: The patient's family history includes Anorexia nervosa in his daughter; Atrial fibrillation in his brother; CAD in his brother; Depression in his daughter; Heart disease in his brother; Hypertension in his son; Sarcoidosis in his brother. There is no history of Anesthesia problems, Hypotension, Malignant hyperthermia, or Pseudochol deficiency.  ROS:   Please see the history of present illness.    All other systems  reviewed and are negative.  EKGs/Labs/Other Studies Reviewed:    The following studies were reviewed today:  Prior notes  November 11, 2020 ZIO personally reviewed Atrial flutter, 100% burden Ventricular rates average 63 bpm while in atrial flutter Occasional PVCs, 2.4% 1 NSVT episode  November 07, 2020 device interrogation personally reviewed 100% atrial flutter with variable AV conduction  January 18, 2020 echo Left ventricular function mildly decreased, 45 to 50% Mild LVH Moderately dilated left atrium Mild MR Replaced aortic valve with a mean gradient of 9 mmHg  November 18, 2020 device interrogation in clinic personally reviewed Presenting rhythm atrial flutter and ventricular pacing at 60 bpm 6 years to battery explant Lead parameters are stable  EKG:  The ekg ordered today demonstrates atrial flutter, ventricular pacing  Recent Labs: 08/26/2020: ALT 19 09/30/2020: BUN 18; Creatinine, Ser 0.97; Potassium 4.3; Sodium 139 10/07/2020: Hemoglobin 12.1; Platelets 208  Recent Lipid Panel    Component Value Date/Time   CHOL 137 08/26/2020 1234   CHOL 158 09/02/2017 1006   TRIG 66 08/26/2020 1234   HDL 75 08/26/2020 1234   HDL 72 09/02/2017 1006   CHOLHDL 1.8 08/26/2020 1234   VLDL 13 08/26/2020 1234   LDLCALC 49 08/26/2020 1234   LDLCALC 68 09/02/2017 1006   LDLDIRECT 117.9 11/16/2010 1355    Physical Exam:    VS:  BP (!) 150/80   Pulse 67   Ht '6\' 1"'$  (1.854 m)   Wt 225 lb (102.1 kg)   SpO2 94%   BMI 29.69 kg/m     Wt Readings from Last 3 Encounters:  11/18/20 225 lb (102.1 kg)  10/10/20 228 lb (103.4 kg)  10/07/20 228 lb (103.4 kg)     GEN:  Well nourished, well developed in no acute distress HEENT: Normal NECK: No JVD; No carotid bruits LYMPHATICS: No lymphadenopathy CARDIAC: Irregularly irregular, no murmurs, rubs, gallops RESPIRATORY:  Clear to auscultation without rales, wheezing or rhonchi  ABDOMEN: Soft, non-tender,  non-distended MUSCULOSKELETAL:  No edema; No deformity  SKIN: Warm and dry NEUROLOGIC:  Alert and oriented x 3 PSYCHIATRIC:  Normal affect   ASSESSMENT:    1. Atrial fibrillation with rapid ventricular response ([place])   2. Atypical atrial flutter (HCC)   3. Pacemaker   4. S/P AVR (aortic valve replacement)   5. H/O maze procedure    PLAN:    In order of problems listed above:  #Atrial fibrillation/atypical atrial flutter Rate control and symptoms have improved on digoxin.  Given his multiple medical comorbidities, I think he is at an elevated risk to undergo an ablation procedure.  I would favor conservative management with medications alone.  If rate control becomes an issue moving forward, agree with Dr. Tanna Furry assessment that a AV junction ablation could be considered.  I would continue Eliquis for stroke prophylaxis.  #Pacemaker in situ Device functioning well.  #Post aortic valve replacement and maze Prosthesis functioning well.  Follows with Dr. Burt Knack.   Follow-up with me as needed.   Total time spent with patient today 45 minutes. This includes reviewing records, evaluating the patient and coordinating care.   Medication Adjustments/Labs and Tests Ordered: Current medicines are reviewed at length with the patient today.  Concerns regarding medicines are outlined above.  Orders Placed This Encounter  Procedures   EKG 12-Lead    No orders of the defined types were placed in this encounter.    Signed, Lars Mage, MD, [facility], [facility] [address] 11/18/2020 10:09 AM    Electrophysiology [facility]

## 2020-11-18 ENCOUNTER — Other Ambulatory Visit: Payer: Self-pay

## 2020-11-18 ENCOUNTER — Ambulatory Visit (INDEPENDENT_AMBULATORY_CARE_PROVIDER_SITE_OTHER): Payer: Medicare Other | Admitting: Cardiology

## 2020-11-18 VITALS — BP 150/80 | HR 67 | Ht 73.0 in | Wt 225.0 lb

## 2020-11-18 DIAGNOSIS — Z9889 Other specified postprocedural states: Secondary | ICD-10-CM | POA: Diagnosis not present

## 2020-11-18 DIAGNOSIS — I4891 Unspecified atrial fibrillation: Secondary | ICD-10-CM

## 2020-11-18 DIAGNOSIS — Z952 Presence of prosthetic heart valve: Secondary | ICD-10-CM

## 2020-11-18 DIAGNOSIS — Z950 Presence of cardiac pacemaker: Secondary | ICD-10-CM | POA: Diagnosis not present

## 2020-11-18 DIAGNOSIS — I484 Atypical atrial flutter: Secondary | ICD-10-CM | POA: Diagnosis not present

## 2020-11-18 NOTE — Patient Instructions (Addendum)
Medication Instructions:  Your physician recommends that you continue on your current medications as directed. Please refer to the Current Medication list given to you today. *If you need a refill on your cardiac medications before your next appointment, please call your pharmacy*  Lab Work: None ordered. If you have labs (blood work) drawn today and your tests are completely normal, you will receive your results only by: [place] (if you have MyChart) OR A paper copy in the mail If you have any lab test that is abnormal or we need to change your treatment, we will call you to review the results.  Testing/Procedures: None ordered.  Follow-Up: At [facility], you and your health needs are our priority.  As part of our continuing mission to provide you with exceptional heart care, we have created designated Provider Care Teams.  These Care Teams include your primary Cardiologist (physician) and Advanced Practice Providers (APPs -  Physician Assistants and Nurse Practitioners) who all work together to provide you with the care you need, when you need it.  Your next appointment:   Your physician wants you to follow-up in: as needed with Vickie Epley, MD.   Remote monitoring is used to monitor your Pacemaker from home. This monitoring reduces the number of office visits required to check your device to one time per year. It allows [place] to keep an eye on the functioning of your device to ensure it is working properly. You are scheduled for a device check from home on 02/06/2021. You may send your transmission at any time that day. If you have a wireless device, the transmission will be sent automatically. After your physician reviews your transmission, you will receive a postcard with your next transmission date.

## 2020-11-24 DIAGNOSIS — M19011 Primary osteoarthritis, right shoulder: Secondary | ICD-10-CM | POA: Diagnosis not present

## 2020-11-24 DIAGNOSIS — M19012 Primary osteoarthritis, left shoulder: Secondary | ICD-10-CM | POA: Diagnosis not present

## 2020-12-02 ENCOUNTER — Other Ambulatory Visit: Payer: Self-pay

## 2020-12-02 ENCOUNTER — Ambulatory Visit (INDEPENDENT_AMBULATORY_CARE_PROVIDER_SITE_OTHER): Payer: Medicare Other | Admitting: Internal Medicine

## 2020-12-02 ENCOUNTER — Encounter: Payer: Self-pay | Admitting: Internal Medicine

## 2020-12-02 ENCOUNTER — Other Ambulatory Visit (HOSPITAL_COMMUNITY): Payer: Self-pay

## 2020-12-02 VITALS — BP 124/70 | HR 60 | Ht 72.0 in

## 2020-12-02 DIAGNOSIS — I25119 Atherosclerotic heart disease of native coronary artery with unspecified angina pectoris: Secondary | ICD-10-CM | POA: Diagnosis not present

## 2020-12-02 DIAGNOSIS — Z950 Presence of cardiac pacemaker: Secondary | ICD-10-CM | POA: Diagnosis not present

## 2020-12-02 DIAGNOSIS — I483 Typical atrial flutter: Secondary | ICD-10-CM | POA: Diagnosis not present

## 2020-12-02 DIAGNOSIS — I495 Sick sinus syndrome: Secondary | ICD-10-CM

## 2020-12-02 MED ORDER — METOPROLOL SUCCINATE ER 50 MG PO TB24
75.0000 mg | ORAL_TABLET | Freq: Two times a day (BID) | ORAL | 3 refills | Status: DC
  Filled 2020-12-02: qty 270, 90d supply, fill #0

## 2020-12-02 NOTE — Patient Instructions (Addendum)
Medication Instructions:  Your physician has recommended you make the following change in your medication:    REDUCE your metoprolol succinate-  Take 75 mg by mouth twice a day.  Take 1.5 tablets of metoprolol succinate 50 mg by mouth twice a day  Labwork: None ordered.  Testing/Procedures: None ordered.  Follow-Up: Your physician wants you to follow-up in: 3 months with Cristopher Peru, MD    Remote monitoring is used to monitor your Pacemaker from home. This monitoring reduces the number of office visits required to check your device to one time per year. It allows [place] to keep an eye on the functioning of your device to ensure it is working properly. You are scheduled for a device check from home on 02/06/2021. You may send your transmission at any time that day. If you have a wireless device, the transmission will be sent automatically. After your physician reviews your transmission, you will receive a postcard with your next transmission date.  Any Other Special Instructions Will Be Listed Below (If Applicable).  If you need a refill on your cardiac medications before your next appointment, please call your pharmacy.

## 2020-12-02 NOTE — Progress Notes (Signed)
HPI [name] returns today for followup of his rapid atrial arrhythmias. He is a pleasant 86 yo man with a long past medical history with CAD, AS, s/p AVR, persistent left atrial flutter, HTN, and sinus node dysfunction. He underwent PCI of the RCA 12 months ago and because of worsening angina, underwent repeat cath and his stents were found to be open. On the table and despite high dose metoprolol, he had runs of atrial flutter with 1:1 AV conduction and subsequent drop in his bp into the 70's. He has been over 100/min about 20% of the time. I saw him after this several weeks ago and we added digoxin and he has felt well but notes that over the last few weeks his energy level is down. No syncope. His bp has been a little low as well although at times he will have some rebound high bp's.  Allergies  Allergen Reactions   Contrast Media [Iodinated Diagnostic Agents] Hives   Gadolinium Derivatives Hives   Metrizamide Hives     Current Outpatient Medications  Medication Sig Dispense Refill   amoxicillin (AMOXIL) 500 MG capsule TAKE 4 CAPSULES BY MOUTH 1 HOUR PRIOR TO DENTAL APPT 20 capsule 99   apixaban (ELIQUIS) 5 MG TABS tablet TAKE 1 TABLET BY MOUTH 2 TIMES DAILY. 180 tablet 1   bisacodyl (DULCOLAX) 5 MG EC tablet Take 5 mg by mouth every other day.     butalbital-acetaminophen-caffeine (FIORICET) 50-325-40 MG tablet TAKE 1 TABLET BY MOUTH TWO TIMES DAILY AS NEEDED FOR TENSION HEADACHES AS DIRECTED 180 tablet 0   butalbital-acetaminophen-caffeine (FIORICET, ESGIC) 50-325-40 MG per tablet Take 1 tablet by mouth 2 (two) times daily.     Cholecalciferol (VITAMIN D3) 2000 units capsule Take 2,000 Units by mouth daily.     Coenzyme Q10 200 MG TABS Take 200 mg by mouth daily.     diclofenac Sodium (VOLTAREN) 1 % GEL Apply 2 g topically 2 (two) times daily as needed (knee pain).     digoxin (LANOXIN) 0.125 MG tablet Take 1 tablet (0.125 mg total) by mouth daily. 90 tablet 3   diltiazem  (CARDIZEM) 30 MG tablet Take 30 mg by mouth as needed (heart rate).     doxazosin (CARDURA) 4 MG tablet TAKE 1 TABLET BY MOUTH ONCE A DAY 90 tablet 2   doxazosin (CARDURA) 4 MG tablet Take 1 tablet by mouth daily 90 tablet 3   ezetimibe (ZETIA) 10 MG tablet Take 1 tablet (10 mg total) by mouth daily. 90 tablet 3   HYDROcodone-acetaminophen (NORCO/VICODIN) 5-325 MG tablet TAKE 1 TABLET BY MOUTH 3 TIMES DAILY AS NEEDED FOR MODERATE PAIN 90 tablet 0   LORazepam (ATIVAN) 1 MG tablet TAKE 1 TABLET BY MOUTH AT BEDTIME. MAY ALSO TAKE 1/2 TABLET DAILY AS NEEDED FOR ANXIETY. 45 tablet 5   magnesium oxide (MAG-OX) 400 MG tablet Take 400 mg by mouth daily.     metoprolol succinate (TOPROL-XL) 50 MG 24 hr tablet Take 1 and 1/2 tablets (75 mg total) by mouth in the morning and at bedtime. Take with or immediately following a meal. 275 tablet 3   Multiple Vitamins-Minerals (MULTIVITAMIN WITH MINERALS) tablet Take 1 tablet by mouth daily.     nitroGLYCERIN (NITROSTAT) 0.4 MG SL tablet PLACE 1 TABLET UNDER THE TONGUE EVERY 5 MINUTES AS NEEDED FOR CHEST PAIN. 25 tablet 5   OVER THE COUNTER MEDICATION Apply 1 patch topically daily as needed (neck and shoulder pain). Chinese pain patch  pantoprazole (PROTONIX) 40 MG tablet TAKE 1 TABLET BY MOUTH DAILY. 90 tablet 3   polyethylene glycol (MIRALAX / GLYCOLAX) 17 g packet Take 17 g by mouth daily as needed for mild constipation.      potassium chloride SA (KLOR-CON) 20 MEQ tablet TAKE 1 TABLET BY MOUTH DAILY. 90 tablet 3   predniSONE (DELTASONE) 10 MG tablet Take 1 tablet (10 mg total) by mouth daily with breakfast. 100 tablet 3   predniSONE (DELTASONE) 50 MG tablet Take as directed prior to procedure 3 tablet 0   rosuvastatin (CRESTOR) 20 MG tablet TAKE 1 TABLET (20 MG TOTAL) BY MOUTH AT BEDTIME. 90 tablet 3   No current facility-administered medications for this visit.     Past Medical History:  Diagnosis Date   Aortic stenosis    moderate aortic stenosis    Arthritis    Benign prostatic hypertrophy    Chronic systolic heart failure (HCC)    Chronotropic incompetence with sinus node dysfunction (HCC)    Status post Guidant dual-mode, dual-pacing, dual-sensing  pacemaker   implantation now programmed to AAI with recent generator change.   Coronary artery disease    status post multiple prior percutaneous coronary interventions, microvascular angina per Dr Olevia Perches   Diverticulitis sigmoid colon recurrent    Dysfunctional autonomic nervous system    Dyspnea    Heart murmur    History of primary hypertension    Hypercoagulable state ([place])    chronically anticoagulated with coumadin   Hyperlipidemia    Hyperthyroidism    Hypothyroidism    Dr. 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 Lia Foyer,    Prediabetes 09/21/2017   Stroke ([place])    1990    ROS:   All systems reviewed and negative except as noted in the HPI.   Past Surgical History:  Procedure Laterality Date   AORTIC VALVE REPLACEMENT  03/15/2011   Procedure: AORTIC VALVE REPLACEMENT (AVR);  Surgeon: Gaye Pollack, MD;  Location: [facility];  Service: Open Heart Surgery;  Laterality: N/A;   APPENDECTOMY     CARDIAC CATHETERIZATION     11   CARDIOVERSION     CARDIOVERSION  04/15/2011   Procedure: CARDIOVERSION;  Surgeon: Loralie Champagne, MD;  Location: [facility];  Service: Cardiovascular;  Laterality: N/A;   CARDIOVERSION N/A 09/11/2014   Procedure: CARDIOVERSION;  Surgeon: Sueanne Margarita, MD;  Location: [facility];  Service: Cardiovascular;  Laterality: N/A;   CARDIOVERSION N/A 06/27/2015   Procedure: CARDIOVERSION;  Surgeon: Thayer Headings, MD;  Location: [facility];  Service: Cardiovascular;  Laterality: N/A;   CARDIOVERSION N/A 07/04/2015   Procedure: CARDIOVERSION;  Surgeon: Evans Lance, MD;  Location: [facility];  Service: Cardiovascular;   Laterality: N/A;   CARDIOVERSION N/A 04/13/2017   Procedure: CARDIOVERSION;  Surgeon: Sueanne Margarita, MD;  Location: [facility] ENDOSCOPY;  Service: Cardiovascular;  Laterality: N/A;   COLONOSCOPY     CORONARY STENT INTERVENTION N/A 10/15/2019   Procedure: CORONARY STENT INTERVENTION;  Surgeon: Jettie Booze, MD;  Location: [facility] CV LAB;  Service: Cardiovascular;  Laterality: N/A;   EP IMPLANTABLE DEVICE N/A 06/16/2015   Procedure: Pacemaker Implant;  Surgeon: Evans Lance, MD;  Location: [facility] CV LAB;  Service: Cardiovascular;  Laterality: N/A;   hemrrhoidectomy     LEFT AND RIGHT HEART CATHETERIZATION WITH CORONARY ANGIOGRAM Bilateral 02/01/2011  Procedure: LEFT AND RIGHT HEART CATHETERIZATION WITH CORONARY ANGIOGRAM;  Surgeon: Hillary Bow, MD;  Location: [facility] C[facility] LAB;  Service: Cardiovascular;  Laterality: Bilateral;   LEFT HEART CATH AND CORONARY ANGIOGRAPHY N/A 10/15/2019   Procedure: LEFT HEART CATH AND CORONARY ANGIOGRAPHY;  Surgeon: Jettie Booze, MD;  Location: [place] CV LAB;  Service: Cardiovascular;  Laterality: N/A;   LEFT HEART CATH AND CORONARY ANGIOGRAPHY N/A 10/07/2020   Procedure: LEFT HEART CATH AND CORONARY ANGIOGRAPHY;  Surgeon: Sherren Mocha, MD;  Location: [place] CV LAB;  Service: Cardiovascular;  Laterality: N/A;   MAZE  03/15/2011   Procedure: MAZE;  Surgeon: Gaye Pollack, MD;  Location: [facility];  Service: Open Heart Surgery;  Laterality: N/A;   PACEMAKER INSERTION  1991   Guidant PPM, most recent Generator Change by Dr Olevia Perches was 08/22/06   RIGHT/LEFT HEART CATH AND CORONARY ANGIOGRAPHY N/A 07/06/2016   Procedure: Right/Left Heart Cath and Coronary Angiography;  Surgeon: Sherren Mocha, MD;  Location: [facility] CV LAB;  Service: Cardiovascular;  Laterality: N/A;   TEE WITHOUT CARDIOVERSION  04/15/2011   Procedure: TRANSESOPHAGEAL ECHOCARDIOGRAM (TEE);  Surgeon: Loralie Champagne, MD;  Location: [facility];  Service: Cardiovascular;   Laterality: N/A;   TEE WITHOUT CARDIOVERSION N/A 09/11/2014   Procedure: TRANSESOPHAGEAL ECHOCARDIOGRAM (TEE);  Surgeon: Sueanne Margarita, MD;  Location: [facility] ENDOSCOPY;  Service: Cardiovascular;  Laterality: N/A;     Family History  Problem Relation Age of Onset   Heart disease Brother        Twin brother has coronary disease and recent AVR for AS   CAD Brother    Atrial fibrillation Brother    Sarcoidosis Brother    Depression Daughter    Anorexia nervosa Daughter    Hypertension Son    Anesthesia problems Neg Hx    Hypotension Neg Hx    Malignant hyperthermia Neg Hx    Pseudochol deficiency Neg Hx      Social History   Socioeconomic History   Marital status: Married    Spouse name: Baker Janus    Number of children: 3   Years of education: Not on file   Highest education level: Not on file  Occupational History   Occupation: Retired    Comment: Physician  Tobacco Use   Smoking status: Never   Smokeless tobacco: Never  Vaping Use   Vaping Use: Never used  Substance and Sexual Activity   Alcohol use: No    Alcohol/week: 0.0 standard drinks   Drug use: No   Sexual activity: Not Currently  Other Topics Concern   Not on file  Social History Narrative   Married to [place]. Has grown children   Retired Horticulturist, commercial MD      Never smoker no alcohol      Social Determinants of Radio broadcast assistant Strain: Not on file  Food Insecurity: Not on file  Transportation Needs: Not on file  Physical Activity: Not on file  Stress: Not on file  Social Connections: Not on file  Intimate Partner Violence: Not on file     BP 124/70 (BP Location: Left Arm, Patient Position: Sitting, Cuff Size: Normal)   Pulse 60   Ht 6' (1.829 m)   SpO2 95%   BMI 30.52 kg/m   Physical Exam:  Well appearing NAD HEENT: Unremarkable Neck:  No JVD, no thyromegally Lymphatics:  No adenopathy Back:  No CVA tenderness Lungs:  Clear with no wheezes HEART:  IRegular rate rhythm, no murmurs, no  rubs, no clicks Abd:  soft, positive bowel sounds, no organomegally, no rebound, no guarding Ext:  2 plus pulses, no edema, no cyanosis, no clubbing Skin:  No rashes no nodules Neuro:  CN II through XII intact, motor grossly intact  DEVICE  Normal device function.  See PaceArt for details.   Assess/Plan:  1. Left atrial flutter - His rates are a little too low. His histograms demonstrated that he is between 60-70/min almost 90% of the time. His bp has run low as well and he has some fatigue. I asked him to reduce his dose of toprol from 200 mg daily in divided doses to 150 mg daily, either 75 bid or 100/50 or 50/100 a.m./p.m. 2. CAD - he is not having angina now that his rates are stable. He will continue rate control and slntg.  3. PPM - his b[employer] Sci DDD PM is working normally. No change in programmed parameters. 4. HTN - his bp is controlled. We will follow.   Carleene Overlie Roan Miklos,MD

## 2020-12-04 ENCOUNTER — Other Ambulatory Visit: Payer: Self-pay | Admitting: Rheumatology

## 2020-12-04 ENCOUNTER — Other Ambulatory Visit (HOSPITAL_COMMUNITY): Payer: Self-pay

## 2020-12-04 MED ORDER — HYDROCODONE-ACETAMINOPHEN 5-325 MG PO TABS
ORAL_TABLET | ORAL | 0 refills | Status: DC
  Filled 2020-12-04: qty 90, 30d supply, fill #0

## 2020-12-04 NOTE — Telephone Encounter (Signed)
Last Visit: 08/20/2020   Next Visit: 02/10/2021   UDS:08/20/2020 c/w   Narc Agreement: 08/20/2020   Last Fill: 09/29/2020   Okay to refill hydrocodone?

## 2020-12-09 ENCOUNTER — Encounter: Payer: Self-pay | Admitting: Neurology

## 2020-12-09 ENCOUNTER — Other Ambulatory Visit: Payer: Self-pay

## 2020-12-09 ENCOUNTER — Ambulatory Visit (INDEPENDENT_AMBULATORY_CARE_PROVIDER_SITE_OTHER): Payer: Medicare Other | Admitting: Neurology

## 2020-12-09 VITALS — BP 111/72 | HR 62 | Ht 72.0 in | Wt 226.0 lb

## 2020-12-09 DIAGNOSIS — G7000 Myasthenia gravis without (acute) exacerbation: Secondary | ICD-10-CM | POA: Diagnosis not present

## 2020-12-09 DIAGNOSIS — R5383 Other fatigue: Secondary | ICD-10-CM

## 2020-12-09 DIAGNOSIS — I25119 Atherosclerotic heart disease of native coronary artery with unspecified angina pectoris: Secondary | ICD-10-CM | POA: Diagnosis not present

## 2020-12-09 DIAGNOSIS — R5381 Other malaise: Secondary | ICD-10-CM | POA: Diagnosis not present

## 2020-12-09 NOTE — Progress Notes (Signed)
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   Medical comorbidities include hypertension, amiodarone-induced hypothyroidism and pulmonary fibrosis, cerebellar stroke (1989, age 37), sinus node dysfunction s/p PPM, CAD s/p multiple interventions, PAF on chronic anticoagulation with Eliquis, and is s/p AVR for aortic stenosis with MAZE.  It is good to see Dr. Velora Heckler today. He has previously seen me for ocular myasthenia gravis (diagnosed 2014 by [facility] at [facility]), cervical spondylosis, and right hand numbness.  Today, he presents for evaluation of generalized fatigue.  He reports progressive difficulty with prolonged walking and often needs to take a break because of shortness of breath and weakness.  He continues to walk unassisted but does use a rollator as needed. Fortunately, he has not suffered any falls.  He also has orthostatic hypotension and sees Dr. Caryl Comes for this.  He was recommended to wear thigh high stockings. He is able to perform ADLs.  Because of his fatigue, he has become much more sedentary and he feels that this may be contributing to his weakness also.  His myasthenia seems to be stable.  He does not notice his double vision as much.  He denies difficulty swallowing/talking.  There is no diurnal variation to his fatigue. TSH and vitamin B12 from 2021 was reviewed and normal.     Medications:  Current Outpatient Medications on File Prior to Visit  Medication Sig Dispense Refill   amoxicillin (AMOXIL) 500 MG capsule TAKE 4 CAPSULES BY MOUTH 1 HOUR PRIOR TO DENTAL APPT 20 capsule 99   apixaban (ELIQUIS) 5 MG TABS tablet TAKE 1 TABLET BY MOUTH 2 TIMES DAILY. 180 tablet 1   bisacodyl (DULCOLAX) 5 MG EC tablet Take 5 mg by mouth every other day.     butalbital-acetaminophen-caffeine (FIORICET)  50-325-40 MG tablet TAKE 1 TABLET BY MOUTH TWO TIMES DAILY AS NEEDED FOR TENSION HEADACHES AS DIRECTED 180 tablet 0   butalbital-acetaminophen-caffeine (FIORICET, ESGIC) 50-325-40 MG per tablet Take 1 tablet by mouth 2 (two) times daily.     Cholecalciferol (VITAMIN D3) 2000 units capsule Take 2,000 Units by mouth daily.     Coenzyme Q10 200 MG TABS Take 200 mg by mouth daily.     diclofenac Sodium (VOLTAREN) 1 % GEL Apply 2 g topically 2 (two) times daily as needed (knee pain).     digoxin (LANOXIN) 0.125 MG tablet Take 1 tablet (0.125 mg total) by mouth daily. 90 tablet 3   diltiazem (CARDIZEM) 30 MG tablet Take 30 mg by mouth as needed (heart rate).     doxazosin (CARDURA) 4 MG tablet TAKE 1 TABLET BY MOUTH ONCE A DAY 90 tablet 2   doxazosin (CARDURA) 4 MG tablet Take 1 tablet by mouth daily 90 tablet 3   ezetimibe (ZETIA) 10 MG tablet Take 1 tablet (10 mg total) by mouth daily. 90 tablet 3   HYDROcodone-acetaminophen (NORCO/VICODIN) 5-325 MG tablet TAKE 1 TABLET BY MOUTH 3 TIMES DAILY AS NEEDED FOR MODERATE PAIN 90 tablet 0   LORazepam (ATIVAN) 1 MG tablet TAKE 1 TABLET BY MOUTH AT BEDTIME. MAY ALSO TAKE 1/2 TABLET DAILY AS NEEDED FOR ANXIETY. 45 tablet 5   magnesium oxide (MAG-OX) 400 MG tablet Take 400 mg by mouth daily.     metoprolol succinate (TOPROL-XL) 50 MG 24 hr tablet Take 1 and  1/2 tablets (75 mg total) by mouth in the morning and at bedtime. Take with or immediately following a meal. (Patient taking differently: Take 75 mg by mouth in the morning and at bedtime. 60 mg twice daily) 275 tablet 3   Multiple Vitamins-Minerals (MULTIVITAMIN WITH MINERALS) tablet Take 1 tablet by mouth daily.     OVER THE COUNTER MEDICATION Apply 1 patch topically daily as needed (neck and shoulder pain). Chinese pain patch     pantoprazole (PROTONIX) 40 MG tablet TAKE 1 TABLET BY MOUTH DAILY. 90 tablet 3   polyethylene glycol (MIRALAX / GLYCOLAX) 17 g packet Take 17 g by mouth daily as needed for mild  constipation.      potassium chloride SA (KLOR-CON) 20 MEQ tablet TAKE 1 TABLET BY MOUTH DAILY. 90 tablet 3   predniSONE (DELTASONE) 10 MG tablet Take 1 tablet (10 mg total) by mouth daily with breakfast. (Patient taking differently: Take 10 mg by mouth daily with breakfast. 5 mg) 100 tablet 3   rosuvastatin (CRESTOR) 20 MG tablet TAKE 1 TABLET (20 MG TOTAL) BY MOUTH AT BEDTIME. 90 tablet 3   nitroGLYCERIN (NITROSTAT) 0.4 MG SL tablet PLACE 1 TABLET UNDER THE TONGUE EVERY 5 MINUTES AS NEEDED FOR CHEST PAIN. 25 tablet 5   predniSONE (DELTASONE) 50 MG tablet Take as directed prior to procedure (Patient not taking: Reported on 12/09/2020) 3 tablet 0   No current facility-administered medications on file prior to visit.    Allergies:  Allergies  Allergen Reactions   Contrast Media [Iodinated Diagnostic Agents] Hives   Gadolinium Derivatives Hives   Metrizamide Hives    Vital Signs:  BP 111/72   Pulse 62   Ht 6' (1.829 m)   Wt 226 lb (102.5 kg)   SpO2 94%   BMI 30.65 kg/m   Neurological Exam: MENTAL STATUS including orientation to time, place, person, recent and remote memory, attention span and concentration and fund of knowledge is excellent.  Speech is not dysarthric,mildly hypophonic.  CRANIAL NERVES:  Pupils are small, round and reactive.  Extraocular muscles are intact except for mild right upgaze restriction.  Subtle right ptosis. No fatigable bulbar or ocular weakness.  Facial muscles are intact.   MOTOR:  Motor strength is 5/5 in all extremities, no fatigable weakness.  REFLEXES:  Reflexes are 3+/4 in the upper extremities. Reflexes are absent in the legs  COORDINATION/GAIT:  Gait is mildly wide-based and stable, stooped posture, unassisted.  He is able to stand up from chair without using arms.  He was also able to perform several chair squats.   Data: CT cervical spine wo contrast 06/10/2017: 1. No acute fracture or loss of vertebral body height. 2. C4-5 grade 1  anterolisthesis. 3. Cervical spondylosis with multilevel disc and facet degenerative changes which are greater on the right. 4. Uncovertebral and facet hypertrophy with multilevel bony foraminal stenosis greatest at the right C3-4 level where it is severe.  IMPRESSION/PLAN: Multifactorial generalized fatigue without true muscle weakness. His neurological exam does not disclose focal weakness and there is no evidence of myasthenia gravis exacerbation, especially given the lack of weakness and the fact that his MG has been ocular manifestation only.  I reassured Dr. Velora Heckler that I did not see any findings to suggest primary neurological pathology causing his weakness. Exam does not support neurodegenerative condition, spinal canal stenosis, or myopathy.  It's possible that his fatigue maybe related to medication side effect vs chronic cardiac conditions.     Thank you for  allowing me to participate in patient's care.  If I can answer any additional questions, I would be pleased to do so.    Sincerely,    Verginia Toohey K. Posey Pronto, DO

## 2020-12-10 ENCOUNTER — Ambulatory Visit (INDEPENDENT_AMBULATORY_CARE_PROVIDER_SITE_OTHER): Payer: Medicare Other | Admitting: Cardiology

## 2020-12-10 ENCOUNTER — Other Ambulatory Visit (HOSPITAL_BASED_OUTPATIENT_CLINIC_OR_DEPARTMENT_OTHER): Payer: Self-pay

## 2020-12-10 ENCOUNTER — Encounter (HOSPITAL_BASED_OUTPATIENT_CLINIC_OR_DEPARTMENT_OTHER): Payer: Self-pay | Admitting: Cardiology

## 2020-12-10 ENCOUNTER — Ambulatory Visit: Payer: Medicare Other | Admitting: Neurology

## 2020-12-10 VITALS — BP 112/64 | HR 60 | Ht 72.0 in | Wt 225.0 lb

## 2020-12-10 DIAGNOSIS — Z952 Presence of prosthetic heart valve: Secondary | ICD-10-CM

## 2020-12-10 DIAGNOSIS — R5382 Chronic fatigue, unspecified: Secondary | ICD-10-CM

## 2020-12-10 DIAGNOSIS — I25119 Atherosclerotic heart disease of native coronary artery with unspecified angina pectoris: Secondary | ICD-10-CM | POA: Diagnosis not present

## 2020-12-10 DIAGNOSIS — I5022 Chronic systolic (congestive) heart failure: Secondary | ICD-10-CM | POA: Diagnosis not present

## 2020-12-10 DIAGNOSIS — I25118 Atherosclerotic heart disease of native coronary artery with other forms of angina pectoris: Secondary | ICD-10-CM | POA: Diagnosis not present

## 2020-12-10 DIAGNOSIS — I471 Supraventricular tachycardia: Secondary | ICD-10-CM | POA: Diagnosis not present

## 2020-12-10 DIAGNOSIS — Z8673 Personal history of transient ischemic attack (TIA), and cerebral infarction without residual deficits: Secondary | ICD-10-CM

## 2020-12-10 DIAGNOSIS — I951 Orthostatic hypotension: Secondary | ICD-10-CM

## 2020-12-10 DIAGNOSIS — I484 Atypical atrial flutter: Secondary | ICD-10-CM

## 2020-12-10 MED ORDER — METOPROLOL SUCCINATE ER 50 MG PO TB24
50.0000 mg | ORAL_TABLET | Freq: Two times a day (BID) | ORAL | 3 refills | Status: DC
  Filled 2020-12-10 – 2021-01-12 (×9): qty 180, 90d supply, fill #0

## 2020-12-10 NOTE — Patient Instructions (Signed)
Medication Instructions:  DECREASE: Metoprolol Succinate to 50 mg twice a day  *If you need a refill on your cardiac medications before your next appointment, please call your pharmacy*   Lab Work: None ordered today   Testing/Procedures: None ordered today   Follow-Up: At [facility], you and your health needs are our priority.  As part of our continuing mission to provide you with exceptional heart care, we have created designated Provider Care Teams.  These Care Teams include your primary Cardiologist (physician) and Advanced Practice Providers (APPs -  Physician Assistants and Nurse Practitioners) who all work together to provide you with the care you need, when you need it.  We recommend signing up for the patient portal called "MyChart".  Sign up information is provided on this After Visit Summary.  MyChart is used to connect with patients for Virtual Visits (Telemedicine).  Patients are able to view lab/test results, encounter notes, upcoming appointments, etc.  Non-urgent messages can be sent to your provider as well.   To learn more about what you can do with MyChart, go to [email or web address].    Your next appointment:   3 month(s)  The format for your next appointment:   In Person  Provider:   Buford Dresser, MD

## 2020-12-10 NOTE — Progress Notes (Signed)
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 He was initially seen as a new patient to me 05/16/20.  He has been followed by Dr. Burt Knack in the past, last visit 03/20/20. At that visit, noted to have angina on exertion only. Noted also to have chronic dyspnea, labile BP. Also noted to have occasional irregular rhythms on his Apple watch, but he is asymptomatic. Imdur was added at that visit.  Today: Has felt fatigued continuously. Dealing with a lot of arthritis. Reviewed cath, visits with Drs. Lovena Le and Quentin Ore.   Generally blood pressures have been well controlled to borderline low. For 2-3 times/every 1-2 months, will have an episode of very elevated blood pressures (can be over 762 systolic) for a time, then self resolves. Behaves like a pheochromocytoma but has never been found.  Mornings are the worst time, but he also knows that he gets orthostatic after meals. Felt better for a time on digoxin. No rates higher than 125 bpm, most have been lower. He is paced on ECG today at 60 bpm.  Has tried decreasing metoprolol succinate BID, between 10-11 AM and PM. Taking 60-75 mg BID (cuts partial section of pill). Asking about cutting to 50 mg BID, will try this and watch for tachycardia.  Has been on cardura 4 mg for years (since 2015) for BPH. Has been on other meds as well, but they did not work as well. Discussed that this may cause orthostasis. No syncope. Had feel   Past Medical History:   Diagnosis Date   Aortic stenosis    moderate aortic stenosis   Arthritis    Benign prostatic hypertrophy    Chronic systolic heart failure (HCC)    Chronotropic incompetence with sinus node dysfunction (HCC)    Status post Guidant dual-mode, dual-pacing, dual-sensing  pacemaker   implantation now programmed to AAI with recent generator change.   Coronary artery disease    status post multiple prior percutaneous coronary interventions, microvascular angina per Dr Olevia Perches   Diverticulitis sigmoid colon recurrent    Dysfunctional autonomic nervous system    Dyspnea    Heart murmur    History of primary hypertension    Hypercoagulable state ([place])    chronically anticoagulated with coumadin   Hyperlipidemia    Hyperthyroidism    Hypothyroidism    Dr. 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 Lia Foyer,    Prediabetes 09/21/2017   Stroke ([place])    1990    Past Surgical History:  Procedure Laterality Date   AORTIC VALVE REPLACEMENT  03/15/2011   Procedure: AORTIC VALVE REPLACEMENT (AVR);  Surgeon: Gaye Pollack, MD;  Location: [place];  Service: Open Heart Surgery;  Laterality: N/A;   APPENDECTOMY     CARDIAC CATHETERIZATION     11   CARDIOVERSION     CARDIOVERSION  04/15/2011  Procedure: CARDIOVERSION;  Surgeon: Loralie Champagne, MD;  Location: [place];  Service: Cardiovascular;  Laterality: N/A;   CARDIOVERSION N/A 09/11/2014   Procedure: CARDIOVERSION;  Surgeon: Sueanne Margarita, MD;  Location: [place] ENDOSCOPY;  Service: Cardiovascular;  Laterality: N/A;   CARDIOVERSION N/A 06/27/2015   Procedure: CARDIOVERSION;  Surgeon: Thayer Headings, MD;  Location: [place];  Service: Cardiovascular;  Laterality: N/A;   CARDIOVERSION N/A 07/04/2015   Procedure: CARDIOVERSION;  Surgeon: Evans Lance, MD;  Location: [place];  Service: Cardiovascular;  Laterality:  N/A;   CARDIOVERSION N/A 04/13/2017   Procedure: CARDIOVERSION;  Surgeon: Sueanne Margarita, MD;  Location[facility];  Service: Cardiovascular;  Laterality: N/A;   COLONOSCOPY     CORONARY STENT INTERVENTION N/A 10/15/2019   Procedure: CORONARY STENT INTERVENTION;  Surgeon: Jettie Booze, MD;  Location: [place] CV LAB;  Service: Cardiovascular;  Laterality: N/A;   EP IMPLANTABLE DEVICE N/A 06/16/2015   Procedure: Pacemaker Implant;  Surgeon: Evans Lance, MD;  Location: [place] CV LAB;  Service: Cardiovascular;  Laterality: N/A;   hemrrhoidectomy     LEFT AND RIGHT HEART CATHETERIZATION WITH CORONARY ANGIOGRAM Bilateral 02/01/2011   Procedure: LEFT AND RIGHT HEART CATHETERIZATION WITH CORONARY ANGIOGRAM;  Surgeon: Hillary Bow, MD;  Location: [facility] CATH LAB;  Service: Cardiovascular;  Laterality: Bilateral;   LEFT HEART CATH AND CORONARY ANGIOGRAPHY N/A 10/15/2019   Procedure: LEFT HEART CATH AND CORONARY ANGIOGRAPHY;  Surgeon: Jettie Booze, MD;  Location: [place] CV LAB;  Service: Cardiovascular;  Laterality: N/A;   LEFT HEART CATH AND CORONARY ANGIOGRAPHY N/A 10/07/2020   Procedure: LEFT HEART CATH AND CORONARY ANGIOGRAPHY;  Surgeon: Sherren Mocha, MD;  Location: [place] CV LAB;  Service: Cardiovascular;  Laterality: N/A;   MAZE  03/15/2011   Procedure: MAZE;  Surgeon: Gaye Pollack, MD;  Location: [place];  Service: Open Heart Surgery;  Laterality: N/A;   PACEMAKER INSERTION  1991   Guidant PPM, most recent Generator Change by Dr Olevia Perches was 08/22/06   RIGHT/LEFT HEART CATH AND CORONARY ANGIOGRAPHY N/A 07/06/2016   Procedure: Right/Left Heart Cath and Coronary Angiography;  Surgeon: Sherren Mocha, MD;  Location: [place] CV LAB;  Service: Cardiovascular;  Laterality: N/A;   TEE WITHOUT CARDIOVERSION  04/15/2011   Procedure: TRANSESOPHAGEAL ECHOCARDIOGRAM (TEE);  Surgeon: Loralie Champagne, MD;  Location: [place];  Service: Cardiovascular;  Laterality: N/A;    TEE WITHOUT CARDIOVERSION N/A 09/11/2014   Procedure: TRANSESOPHAGEAL ECHOCARDIOGRAM (TEE);  Surgeon: Sueanne Margarita, MD;  Location: [facility] ENDOSCOPY;  Service: Cardiovascular;  Laterality: N/A;    Current Medications: Current Outpatient Medications on File Prior to Visit  Medication Sig   apixaban (ELIQUIS) 5 MG TABS tablet TAKE 1 TABLET BY MOUTH 2 TIMES DAILY.   bisacodyl (DULCOLAX) 5 MG EC tablet Take 5 mg by mouth every other day.   butalbital-acetaminophen-caffeine (FIORICET, ESGIC) 50-325-40 MG per tablet Take 1 tablet by mouth 2 (two) times daily.   Cholecalciferol (VITAMIN D3) 2000 units capsule Take 2,000 Units by mouth daily.   Coenzyme Q10 200 MG TABS Take 200 mg by mouth daily.   diclofenac Sodium (VOLTAREN) 1 % GEL Apply 2 g topically 2 (two) times daily as needed (knee pain).   digoxin (LANOXIN) 0.125 MG tablet Take 1 tablet (0.125 mg total) by mouth daily.   diltiazem (CARDIZEM) 30 MG tablet Take 30 mg by mouth as needed (heart rate).   doxazosin (CARDURA) 4 MG tablet Take 1 tablet by mouth daily  ezetimibe (ZETIA) 10 MG tablet Take 1 tablet (10 mg total) by mouth daily.   HYDROcodone-acetaminophen (NORCO/VICODIN) 5-325 MG tablet TAKE 1 TABLET BY MOUTH 3 TIMES DAILY AS NEEDED FOR MODERATE PAIN   LORazepam (ATIVAN) 1 MG tablet TAKE 1 TABLET BY MOUTH AT BEDTIME. MAY ALSO TAKE 1/2 TABLET DAILY AS NEEDED FOR ANXIETY.   magnesium oxide (MAG-OX) 400 MG tablet Take 400 mg by mouth daily.   metoprolol succinate (TOPROL-XL) 50 MG 24 hr tablet Take 1 and 1/2 tablets (75 mg total) by mouth in the morning and at bedtime. Take with or immediately following a meal. (Patient taking differently: Take 75 mg by mouth in the morning and at bedtime. 60 mg twice daily)   Multiple Vitamins-Minerals (MULTIVITAMIN WITH MINERALS) tablet Take 1 tablet by mouth daily.   OVER THE COUNTER MEDICATION Apply 1 patch topically daily as needed (neck and shoulder pain). Chinese pain patch   pantoprazole (PROTONIX) 40  MG tablet TAKE 1 TABLET BY MOUTH DAILY.   polyethylene glycol (MIRALAX / GLYCOLAX) 17 g packet Take 17 g by mouth daily as needed for mild constipation.    potassium chloride SA (KLOR-CON) 20 MEQ tablet TAKE 1 TABLET BY MOUTH DAILY.   predniSONE (DELTASONE) 10 MG tablet Take 1 tablet (10 mg total) by mouth daily with breakfast. (Patient taking differently: Take 10 mg by mouth daily with breakfast. 5 mg)   predniSONE (DELTASONE) 50 MG tablet Take as directed prior to procedure   rosuvastatin (CRESTOR) 20 MG tablet TAKE 1 TABLET (20 MG TOTAL) BY MOUTH AT BEDTIME.   nitroGLYCERIN (NITROSTAT) 0.4 MG SL tablet PLACE 1 TABLET UNDER THE TONGUE EVERY 5 MINUTES AS NEEDED FOR CHEST PAIN.   No current facility-administered medications on file prior to visit.     Allergies:   Contrast media [iodinated diagnostic agents], Gadolinium derivatives, and Metrizamide   Social History   Tobacco Use   Smoking status: Never   Smokeless tobacco: Never  Vaping Use   Vaping Use: Never used  Substance Use Topics   Alcohol use: No    Alcohol/week: 0.0 standard drinks   Drug use: No    Family History: family history includes Anorexia nervosa in his daughter; Atrial fibrillation in his brother; CAD in his brother; Depression in his daughter; Heart disease in his brother; Hypertension in his son; Sarcoidosis in his brother. There is no history of Anesthesia problems, Hypotension, Malignant hyperthermia, or Pseudochol deficiency.  ROS:   Please see the history of present illness.  All other systems are reviewed and negative.    EKGs/Labs/Other Studies Reviewed:    The following studies were reviewed today:  Monitor 11/11/2020: 1. Atrial flutter with a CVR 2. Rare PVC's and NSVT 3. No prolonged pauses 4. Brief episodes of RVR with an ave HR of 63/min and briefly up to 130/min.  Left Heart Cath 10/07/2020:   Prox RCA lesion is 30% stenosed.   Mid RCA lesion is 40% stenosed.   Prox LAD to Mid LAD lesion  is 40% stenosed.   Previously placed Mid Cx to Dist Cx stent (unknown type) is  widely patent.   Non-stenotic Dist RCA lesion was previously treated.   Patent left main with no stenosis Patent LAD with mild-moderate proximal stenosis unchanged from previous cardiac cath studies Patent LCx stent with no significant stenosis Patent RCA with mild diffuse plaquing and patent stent in the distal RCA Normal LV filling pressure and no transaortic gradient across bioprosthetic aortic valve   Recommend: medical  therapy.    Note the patient has frequent runs of PAT throughout the procedure with HR above 160 bpm and drop in blood pressure down to the 70's during these runs  Echo 01-18-2020:   1. Left ventricular ejection fraction, by estimation, is 45 to 50%. The  left ventricle has mildly decreased function. The left ventricle  demonstrates regional wall motion abnormalities (see scoring  diagram/findings for description). There is mild left  ventricular hypertrophy. Left ventricular diastolic function could not be  evaluated. There is severe akinesis of the left ventricular, basal-mid  inferior wall suggestive of scar.   2. Left atrial size was moderately dilated.   3. The mitral valve is abnormal. Mild mitral valve regurgitation.   4. The aortic valve has been repaired/replaced with a bioprosthetic valve  (2013). Aortic valve area, by VTI measures 1.27 cm. Aortic valve mean  gradient measures 9.0 mmHg. Aortic valve Vmax measures 2.05 m/s. DI of  0.33.   5. Aortic dilatation noted. There is mild dilatation of the ascending  aorta, measuring 41 mm.   6. Right atrial size was mildly dilated.   7. Right ventricular systolic function is low normal. The right  ventricular size is normal. There is normal pulmonary artery systolic  pressure.   8. The inferior vena cava is dilated in size with >50% respiratory  variability, suggesting right atrial pressure of 8 mmHg.   Comparison(s): No  significant change from prior study. 10/15/2019: LVEF  45-50%, basal to mid inferior hypokinesis, bioprosthetic AVR - mean  gradient 7 mmHg.    Cardiac Cath 10-15-2019: Conclusion     Prox LAD to Mid LAD lesion is 40% stenosed. Prox RCA lesion is 30% stenosed. Mid RCA lesion is 40% stenosed. Previously placed Mid Cx to Dist Cx stent (unknown type) is widely patent. Dist RCA lesion is 75% stenosed. This was new compared to prior cath in 2018. A drug-eluting stent was successfully placed using a SYNERGY XD 2.75X16. THis was dilated to 3.0 mm and postdilated to 3.4 mm. Post intervention, there is a 0% residual stenosis. There is moderate left ventricular systolic dysfunction. LV end diastolic pressure is normal. The left ventricular ejection fraction is 35-45% by visual estimate. Global hypokinesis.   No aspirin given Eliquis and Plavix.  Could use Plavix longer if no bleeding issues, but bare minimum would be one month.     Continue aggressive secondary prevention and management of his tachycardia.    Diagnostic Dominance: Right   Left Main  Vessel is angiographically normal.  Left Anterior Descending  Prox LAD to Mid LAD lesion is 40% stenosed. There is mild diffuse proximal LAD stenosis unchanged from previous study. The LAD wraps around the LV apex  Left Circumflex  Previously placed Mid Cx to Dist Cx stent (unknown type) is widely patent.  Right Coronary Artery  There is mild diffuse disease throughout the vessel. Large, dominant vessel. There are diffuse irregularities with mild stenoses in the proximal and mid vessel. The distal vessel is widely patent. The PDA and PLA branches are patent.  Prox RCA lesion is 30% stenosed. The lesion is calcified.  Mid RCA lesion is 40% stenosed. The lesion is calcified.  Dist RCA lesion is 75% stenosed.    Intervention     Dist RCA lesion  Stent  CATH LAUNCHER 6FR JR4 guide catheter was inserted. Lesion crossed with guidewire using a  WIRE ASAHI PROWATER 180CM. Pre-stent angioplasty was performed using a BALLOON SAPPHIRE 2.5X12. A drug-eluting stent was successfully  placed using a SYNERGY XD 2.75X16. Stent strut is well apposed. Post-stent angioplasty was performed using a BALLOON SAPPHIRE [place] 3.25X12.  Post-Intervention Lesion Assessment  The intervention was successful. Pre-interventional TIMI flow is 3. Post-intervention TIMI flow is 3. No complications occurred at this lesion.  There is a 0% residual stenosis post intervention.   EKG:  EKG is personally reviewed.    12/10/2020: atrial flutter, V paced at 60 bpm 09/15/2020: atrial tachycardia/atypical atrial flutter at 74 bpm, RBBB, LAFB 05/16/2020: atrial tachycardia with a ventricular rate of 70 bpm  Recent Labs: 08/26/2020: ALT 19 09/30/2020: BUN 18; Creatinine, Ser 0.97; Potassium 4.3; Sodium 139 10/07/2020: Hemoglobin 12.1; Platelets 208  Recent Lipid Panel    Component Value Date/Time   CHOL 137 08/26/2020 1234   CHOL 158 09/02/2017 1006   TRIG 66 08/26/2020 1234   HDL 75 08/26/2020 1234   HDL 72 09/02/2017 1006   CHOLHDL 1.8 08/26/2020 1234   VLDL 13 08/26/2020 1234   LDLCALC 49 08/26/2020 1234   LDLCALC 68 09/02/2017 1006   LDLDIRECT 117.9 11/16/2010 1355    Physical Exam:    VS:  BP 112/64   Pulse 60   Ht 6' (1.829 m)   Wt 225 lb (102.1 kg)   SpO2 97%   BMI 30.52 kg/m     Wt Readings from Last 3 Encounters:  12/10/20 225 lb (102.1 kg)  12/09/20 226 lb (102.5 kg)  11/18/20 225 lb (102.1 kg)    GEN: Well nourished, well developed in no acute distress HEENT: Normal, moist mucous membranes NECK: No JVD CARDIAC:  largely regular rhythm, normal S1 and S2, no rubs or gallops. 1/6 systolic murmur. VASCULAR: Radial and DP pulses 2+ bilaterally. No carotid bruits RESPIRATORY:  Clear to auscultation without rales, wheezing or rhonchi  ABDOMEN: Soft, non-tender, non-distended MUSCULOSKELETAL:  Ambulates independently SKIN: Warm and dry, no  edema NEUROLOGIC:  Alert and oriented x 3. No focal neuro deficits noted. PSYCHIATRIC:  Normal affect    ASSESSMENT:    1. Atrial tachycardia ([place])   2. Chronic fatigue   3. Orthostatic hypotension   4. Chronic systolic heart failure ([place])   5. Coronary artery disease of native artery of native heart with stable angina pectoris ([place])   6. History of CVA (cerebrovascular accident)   7. S/P AVR (aortic valve replacement)    PLAN:    Fatigue, chronic Orthostasis -unclear etiology. Symptoms did not improve after PCI in 2021. No significant progression of disease on recent cath -labs have been stable -we discussed his medication regimen. I am concerned about the doxazosin given his orthostatic symptoms, especially in the morning and with meals. However, he states that he has significant prostate issues and needs the doxazosin. -see prior extensive conversation re: atrial tach. I suspect this is one of the drivers of his symptoms. He follows closely with Dr. Lovena Le on this. He has discussed with Dr. Quentin Ore as well -we will try decreasing his metoprolol succinate dose from 100 mg BID to 50 mg BID. He will contact me if his rates increase.  Chronic systolic and diastolic heart failure Low normal EF (45-50% with akinetic basal-mid inferior wall 12/2019), suspect ischemic cardiomyopathy (given focal WMA) vs arrythmia mediated -similar to echo from 10/15/19, but echo 02/13/2019 read as 55-60% -metoprolol as above -no BP room for ARB today. History of orthostatic hypotension, need to be cautious about antihypertensive agents  Atrial tachycardia: history of atrial fib, atrial flutter, and atrial tachycardia S/P Dual Chamber PPM -has  had extensive evaluation at [facility], [facility], [place], [facility] etc without complete resolution -follows closely with Dr. Lovena Le. Has discussed with Dr. Quentin Ore as well.   History of CVA: -continue apixaban, rosuvastatin  Bioprosthetic AVR: -no longer on aspirin, is on  apixaban alone -echo reviewed as above  CAD with stable angina: -has not tolerated imdur for angina -on maximum dose of metoprolol succinate -reviewed cath from 09/2019. Had PCI to RCA, did not improve fatigue -did not have improvement on ranolazine in the past -most recent cath without significant change  Hypercholesterolemia: -continue rosuvastatin, ezetimibe -Last LDL 45, goal <70. Did discuss data that there is benefit to lowering LDL as low as medication permits. He is not having known medication side effects at this time.  Cardiac risk counseling and prevention recommendations: -recommend heart healthy/Mediterranean diet, with whole grains, fruits, vegetable, fish, lean meats, nuts, and olive oil. Limit salt. -recommend moderate walking, 3-5 times/week for 30-50 minutes each session. Aim for at least 150 minutes.week. Goal should be pace of 3 miles/hours, or walking 1.5 miles in 30 minutes -recommend avoidance of tobacco products. Avoid excess alcohol.  Plan for follow up: 3 months, will contact me if symptoms worsen  Total time of encounter: 50 minutes total time of encounter, including 50 minutes spent in face-to-face patient care. This time includes coordination of care and counseling regarding symptoms and options for management. Remainder of non-face-to-face time involved reviewing chart documents/testing relevant to the patient encounter and documentation in the medical record.  Buford Dresser, MD, PhD, [facility]    Medication Adjustments/Labs and Tests Ordered: Current medicines are reviewed at length with the patient today.  Concerns regarding medicines are outlined above.  No orders of the defined types were placed in this encounter.   Meds ordered this encounter  Medications   metoprolol succinate (TOPROL-XL) 50 MG 24 hr tablet    Sig: Take 1 tablet (50 mg total) by mouth 2 (two) times daily. Take with or immediately following a meal.     Dispense:  180 tablet    Refill:  3    Patient will call when refills needed     Patient Instructions  Medication Instructions:  DECREASE: Metoprolol Succinate to 50 mg twice a day  *If you need a refill on your cardiac medications before your next appointment, please call your pharmacy*   Lab Work: None ordered today   Testing/Procedures: None ordered today   Follow-Up: At [facility], you and your health needs are our priority.  As part of our continuing mission to provide you with exceptional heart care, we have created designated Provider Care Teams.  These Care Teams include your primary Cardiologist (physician) and Advanced Practice Providers (APPs -  Physician Assistants and Nurse Practitioners) who all work together to provide you with the care you need, when you need it.  We recommend signing up for the patient portal called "MyChart".  Sign up information is provided on this After Visit Summary.  MyChart is used to connect with patients for Virtual Visits (Telemedicine).  Patients are able to view lab/test results, encounter notes, upcoming appointments, etc.  Non-urgent messages can be sent to your provider as well.   To learn more about what you can do with MyChart, go to [email or web address].    Your next appointment:   3 month(s)  The format for your next appointment:   In Person  Provider:   Buford Dresser, MD    Signed, Buford Dresser, MD PhD 12/10/2020  [facility]

## 2020-12-18 ENCOUNTER — Other Ambulatory Visit (HOSPITAL_BASED_OUTPATIENT_CLINIC_OR_DEPARTMENT_OTHER): Payer: Self-pay

## 2020-12-18 MED FILL — Lorazepam Tab 1 MG: ORAL | 30 days supply | Qty: 45 | Fill #0 | Status: AC

## 2020-12-19 DIAGNOSIS — Z23 Encounter for immunization: Secondary | ICD-10-CM | POA: Diagnosis not present

## 2020-12-24 ENCOUNTER — Encounter: Payer: Self-pay | Admitting: Internal Medicine

## 2020-12-31 ENCOUNTER — Other Ambulatory Visit: Payer: Self-pay

## 2020-12-31 ENCOUNTER — Encounter (HOSPITAL_BASED_OUTPATIENT_CLINIC_OR_DEPARTMENT_OTHER): Payer: Self-pay | Admitting: Pharmacist

## 2020-12-31 ENCOUNTER — Encounter: Payer: Self-pay | Admitting: Internal Medicine

## 2020-12-31 ENCOUNTER — Other Ambulatory Visit (HOSPITAL_BASED_OUTPATIENT_CLINIC_OR_DEPARTMENT_OTHER): Payer: Self-pay

## 2020-12-31 ENCOUNTER — Non-Acute Institutional Stay: Payer: Medicare Other | Admitting: Internal Medicine

## 2020-12-31 VITALS — BP 144/90 | HR 57 | Temp 96.3°F | Ht 72.0 in | Wt 226.0 lb

## 2020-12-31 DIAGNOSIS — E785 Hyperlipidemia, unspecified: Secondary | ICD-10-CM

## 2020-12-31 DIAGNOSIS — K219 Gastro-esophageal reflux disease without esophagitis: Secondary | ICD-10-CM

## 2020-12-31 DIAGNOSIS — I25118 Atherosclerotic heart disease of native coronary artery with other forms of angina pectoris: Secondary | ICD-10-CM | POA: Diagnosis not present

## 2020-12-31 DIAGNOSIS — R5382 Chronic fatigue, unspecified: Secondary | ICD-10-CM | POA: Diagnosis not present

## 2020-12-31 DIAGNOSIS — D472 Monoclonal gammopathy: Secondary | ICD-10-CM | POA: Diagnosis not present

## 2020-12-31 DIAGNOSIS — R5381 Other malaise: Secondary | ICD-10-CM

## 2020-12-31 DIAGNOSIS — M158 Other polyosteoarthritis: Secondary | ICD-10-CM | POA: Diagnosis not present

## 2020-12-31 DIAGNOSIS — I4892 Unspecified atrial flutter: Secondary | ICD-10-CM

## 2020-12-31 DIAGNOSIS — I5022 Chronic systolic (congestive) heart failure: Secondary | ICD-10-CM | POA: Diagnosis not present

## 2020-12-31 MED ORDER — DESOXIMETASONE 0.25 % EX CREA
1.0000 "application " | TOPICAL_CREAM | Freq: Two times a day (BID) | CUTANEOUS | 0 refills | Status: DC
  Filled 2020-12-31: qty 60, 30d supply, fill #0

## 2020-12-31 MED ORDER — MOMETASONE FUROATE 0.1 % EX CREA
1.0000 "application " | TOPICAL_CREAM | Freq: Every day | CUTANEOUS | 0 refills | Status: DC
  Filled 2020-12-31: qty 15, 15d supply, fill #0

## 2020-12-31 MED ORDER — DIGOXIN 125 MCG PO TABS
0.1250 mg | ORAL_TABLET | Freq: Every day | ORAL | 3 refills | Status: DC
  Filled 2020-12-31: qty 90, 90d supply, fill #0
  Filled 2021-03-24: qty 90, 90d supply, fill #1
  Filled 2021-06-30: qty 90, 90d supply, fill #2
  Filled 2021-09-23: qty 90, 90d supply, fill #3

## 2020-12-31 MED FILL — Potassium Chloride Microencapsulated Crys ER Tab 20 mEq: ORAL | 90 days supply | Qty: 90 | Fill #0 | Status: AC

## 2020-12-31 NOTE — Progress Notes (Signed)
Location:  [place] of Service:  Clinic (12)  Provider:   Code Status:  Goals of Care:  Advanced Directives 12/09/2020  Does Patient Have a Medical Advance Directive? Yes  Type of Paramedic of [place];Living will;Out of facility DNR (pink MOST or yellow form)  Does patient want to make changes to medical advance directive? -  Copy of [place] in Chart? -  Would patient like information on creating a medical advance directive? -  Pre-existing out of facility DNR order (yellow form or pink MOST form) -     Chief Complaint  Patient presents with   Medical Management of Chronic Issues    Patient returns to the clinic for 6 month follow up.    Quality Metric Gaps    Covid #4, Shingrix, TDAP, PCV    HPI: Patient is a 86 y.o. male seen today for medical management of chronic diseases.    Came for his Routine Visit  Has h/o CAD, s/p Stent Distal RCA Stent Cath in 09/2019 Repeat Cath Done in 08/22 for Anginal Symptoms. Stents were open   Follows very Closely with Cardiology  H/o Atrial Flutter on Eliquis Due to high runs of A Flutter Digoxin was added  Since then doing better  Sinus Node Dysfunction s/p PPM Orthostatic Hypotension  Severe OsteoArthritis on Norco Prn by Rheumatology and Prednisone PRN  Adrenal Insufficiency per Dr Renne Crigler due to Prolong Steroid use for Osteoarthritis  Takes Prednisone 5 mg PRN MGUS Follows with Dr Benay Spice for her Labs BPH   Chronic Fatigue and Weakness  No New issues  No Falls. Pain is somewhat controlled on Norco Had many cream with him including Biofreeze, Voltaren Get and Salonpas which he has been using with no help with the pain Chronic Fatigue continues. Wants to know if this could be Chronic Fatigue Syndrome ? Duloxetine would be worth taking    Past Medical History:  Diagnosis Date   Aortic stenosis    moderate aortic stenosis   Arthritis     Benign prostatic hypertrophy    Chronic systolic heart failure (HCC)    Chronotropic incompetence with sinus node dysfunction (HCC)    Status post Guidant dual-mode, dual-pacing, dual-sensing  pacemaker   implantation now programmed to AAI with recent generator change.   Coronary artery disease    status post multiple prior percutaneous coronary interventions, microvascular angina per Dr Olevia Perches   Diverticulitis sigmoid colon recurrent    Dysfunctional autonomic nervous system    Dyspnea    Heart murmur    History of primary hypertension    Hypercoagulable state ([place])    chronically anticoagulated with coumadin   Hyperlipidemia    Hyperthyroidism    Hypothyroidism    Dr. 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 Lia Foyer,    Prediabetes 09/21/2017   Stroke ([place])    1990    Past Surgical History:  Procedure Laterality Date   AORTIC VALVE REPLACEMENT  03/15/2011   Procedure: AORTIC VALVE REPLACEMENT (AVR);  Surgeon: Gaye Pollack, MD;  Location: [place];  Service: Open Heart Surgery;  Laterality: N/A;   APPENDECTOMY     CARDIAC CATHETERIZATION     11   CARDIOVERSION     CARDIOVERSION  04/15/2011   Procedure: CARDIOVERSION;  Surgeon: Loralie Champagne, MD;  Location: [place];  Service: Cardiovascular;  Laterality: N/A;   CARDIOVERSION N/A 09/11/2014   Procedure: CARDIOVERSION;  Surgeon: Sueanne Margarita, MD;  Location: [place] ENDOSCOPY;  Service: Cardiovascular;  Laterality: N/A;   CARDIOVERSION N/A 06/27/2015   Procedure: CARDIOVERSION;  Surgeon: Thayer Headings, MD;  Location: [place];  Service: Cardiovascular;  Laterality: N/A;   CARDIOVERSION N/A 07/04/2015   Procedure: CARDIOVERSION;  Surgeon: Evans Lance, MD;  Location: [place];  Service: Cardiovascular;  Laterality: N/A;   CARDIOVERSION N/A 04/13/2017   Procedure: CARDIOVERSION;  Surgeon: Sueanne Margarita, MD;  Location: [facility] ENDOSCOPY;  Service: Cardiovascular;  Laterality: N/A;   COLONOSCOPY     CORONARY STENT INTERVENTION N/A 10/15/2019   Procedure: CORONARY STENT INTERVENTION;  Surgeon: Jettie Booze, MD;  Location: [place] CV LAB;  Service: Cardiovascular;  Laterality: N/A;   EP IMPLANTABLE DEVICE N/A 06/16/2015   Procedure: Pacemaker Implant;  Surgeon: Evans Lance, MD;  Location: [place] CV LAB;  Service: Cardiovascular;  Laterality: N/A;   hemrrhoidectomy     LEFT AND RIGHT HEART CATHETERIZATION WITH CORONARY ANGIOGRAM Bilateral 02/01/2011   Procedure: LEFT AND RIGHT HEART CATHETERIZATION WITH CORONARY ANGIOGRAM;  Surgeon: Hillary Bow, MD;  Location: [facility] CATH LAB;  Service: Cardiovascular;  Laterality: Bilateral;   LEFT HEART CATH AND CORONARY ANGIOGRAPHY N/A 10/15/2019   Procedure: LEFT HEART CATH AND CORONARY ANGIOGRAPHY;  Surgeon: Jettie Booze, MD;  Location: [place] CV LAB;  Service: Cardiovascular;  Laterality: N/A;   LEFT HEART CATH AND CORONARY ANGIOGRAPHY N/A 10/07/2020   Procedure: LEFT HEART CATH AND CORONARY ANGIOGRAPHY;  Surgeon: Sherren Mocha, MD;  Location: [place] CV LAB;  Service: Cardiovascular;  Laterality: N/A;   MAZE  03/15/2011   Procedure: MAZE;  Surgeon: Gaye Pollack, MD;  Location: [place];  Service: Open Heart Surgery;  Laterality: N/A;   PACEMAKER INSERTION  1991   Guidant PPM, most recent Generator Change by Dr Olevia Perches was 08/22/06   RIGHT/LEFT HEART CATH AND CORONARY ANGIOGRAPHY N/A 07/06/2016   Procedure: Right/Left Heart Cath and Coronary Angiography;  Surgeon: Sherren Mocha, MD;  Location: [place] CV LAB;  Service: Cardiovascular;  Laterality: N/A;   TEE WITHOUT CARDIOVERSION  04/15/2011   Procedure: TRANSESOPHAGEAL ECHOCARDIOGRAM (TEE);  Surgeon: Loralie Champagne, MD;  Location: [employer];  Service: Cardiovascular;  Laterality: N/A;   TEE WITHOUT CARDIOVERSION N/A 09/11/2014   Procedure: TRANSESOPHAGEAL ECHOCARDIOGRAM  (TEE);  Surgeon: Sueanne Margarita, MD;  Location: [facility] ENDOSCOPY;  Service: Cardiovascular;  Laterality: N/A;    Allergies  Allergen Reactions   Contrast Media [Iodinated Diagnostic Agents] Hives   Gadolinium Derivatives Hives   Metrizamide Hives    Outpatient Encounter Medications as of 12/31/2020  Medication Sig   apixaban (ELIQUIS) 5 MG TABS tablet TAKE 1 TABLET BY MOUTH 2 TIMES DAILY.   bisacodyl (DULCOLAX) 5 MG EC tablet Take 5 mg by mouth every other day.   butalbital-acetaminophen-caffeine (FIORICET, ESGIC) 50-325-40 MG per tablet Take 1 tablet by mouth 2 (two) times daily.   Cholecalciferol (VITAMIN D3) 2000 units capsule Take 2,000 Units by mouth daily.   Coenzyme Q10 200 MG TABS Take 200 mg by mouth daily.   desoximetasone (TOPICORT) 0.25 % cream Apply 1 application topically 2 (two) times daily.   diclofenac Sodium (VOLTAREN) 1 % GEL Apply 2 g topically 2 (two) times daily as needed (knee pain).   diltiazem (CARDIZEM) 30 MG tablet Take 30 mg by mouth as needed (heart rate).   doxazosin (CARDURA) 4 MG  tablet Take 1 tablet by mouth daily   ezetimibe (ZETIA) 10 MG tablet Take 1 tablet (10 mg total) by mouth daily.   HYDROcodone-acetaminophen (NORCO/VICODIN) 5-325 MG tablet TAKE 1 TABLET BY MOUTH 3 TIMES DAILY AS NEEDED FOR MODERATE PAIN   LORazepam (ATIVAN) 1 MG tablet TAKE 1 TABLET BY MOUTH AT BEDTIME. MAY ALSO TAKE 1/2 TABLET DAILY AS NEEDED FOR ANXIETY.   magnesium oxide (MAG-OX) 400 MG tablet Take 400 mg by mouth daily.   metoprolol succinate (TOPROL-XL) 50 MG 24 hr tablet Take 1 tablet (50 mg total) by mouth 2 (two) times daily. Take with or immediately following a meal.   mometasone (ELOCON) 0.1 % cream Apply 1 application topically daily.   Multiple Vitamins-Minerals (MULTIVITAMIN WITH MINERALS) tablet Take 1 tablet by mouth daily.   OVER THE COUNTER MEDICATION Apply 1 patch topically daily as needed (neck and shoulder pain). Chinese pain patch   pantoprazole (PROTONIX) 40 MG  tablet TAKE 1 TABLET BY MOUTH DAILY.   polyethylene glycol (MIRALAX / GLYCOLAX) 17 g packet Take 17 g by mouth daily as needed for mild constipation.    potassium chloride SA (KLOR-CON) 20 MEQ tablet TAKE 1 TABLET BY MOUTH DAILY.   predniSONE (DELTASONE) 10 MG tablet Take 1 tablet (10 mg total) by mouth daily with breakfast. (Patient taking differently: Take 10 mg by mouth daily with breakfast. 5 mg)   rosuvastatin (CRESTOR) 20 MG tablet TAKE 1 TABLET (20 MG TOTAL) BY MOUTH AT BEDTIME.   [DISCONTINUED] digoxin (LANOXIN) 0.125 MG tablet Take 1 tablet (0.125 mg total) by mouth daily.   [DISCONTINUED] predniSONE (DELTASONE) 50 MG tablet Take as directed prior to procedure   digoxin (LANOXIN) 0.125 MG tablet Take 1 tablet (0.125 mg total) by mouth daily.   nitroGLYCERIN (NITROSTAT) 0.4 MG SL tablet PLACE 1 TABLET UNDER THE TONGUE EVERY 5 MINUTES AS NEEDED FOR CHEST PAIN.   No facility-administered encounter medications on file as of 12/31/2020.    Review of Systems:  Review of Systems  Constitutional: Negative.   HENT: Negative.    Respiratory: Negative.    Cardiovascular:  Positive for leg swelling.  Gastrointestinal: Negative.   Genitourinary: Negative.   Musculoskeletal:  Positive for arthralgias, back pain, joint swelling, myalgias, neck pain and neck stiffness.  Skin: Negative.   Neurological:  Negative for dizziness.  Psychiatric/Behavioral:  The patient is nervous/anxious.    Health Maintenance  Topic Date Due   Pneumonia Vaccine 31+ Years old (1 - PCV) Never done   TETANUS/TDAP  Never done   Zoster Vaccines- Shingrix (1 of 2) Never done   COVID-19 Vaccine (4 - Booster for Pfizer series) 12/27/2019   INFLUENZA VACCINE  Completed   HPV VACCINES  Aged Out    Physical Exam: Vitals:   12/31/20 0955  BP: (!) 144/90  Pulse: (!) 57  Temp: (!) 96.3 F (35.7 C)  SpO2: 97%  Weight: 226 lb (102.5 kg)  Height: 6' (1.829 m)   Body mass index is 30.65 kg/m. Physical Exam Vitals  reviewed.  Constitutional:      Appearance: Normal appearance.  HENT:     Head: Normocephalic.     Right Ear: Tympanic membrane normal. There is no impacted cerumen.     Left Ear: Tympanic membrane normal. There is no impacted cerumen.     Nose: Nose normal.     Mouth/Throat:     Mouth: Mucous membranes are moist.     Pharynx: Oropharynx is clear.  Eyes:  Pupils: Pupils are equal, round, and reactive to light.  Cardiovascular:     Rate and Rhythm: Normal rate and regular rhythm.     Pulses: Normal pulses.     Heart sounds: Normal heart sounds.  Pulmonary:     Effort: Pulmonary effort is normal.     Breath sounds: Normal breath sounds.  Abdominal:     General: Abdomen is flat. Bowel sounds are normal.     Palpations: Abdomen is soft.  Musculoskeletal:     Cervical back: Neck supple.     Comments: Mild swelling Bilateral Has arthritic changes in his hand  Shoulder has Limited mobility due to Pain  Skin:    General: Skin is warm and dry.  Neurological:     General: No focal deficit present.     Mental Status: He is alert and oriented to person, place, and time.  Psychiatric:        Mood and Affect: Mood normal.        Thought Content: Thought content normal.    Labs reviewed: Basic Metabolic Panel: Recent Labs    01/11/20 0947 08/26/20 1234 09/30/20 1657  NA 141 138 139  K 4.2 4.3 4.3  CL 106 102 98  CO2 29 29 23   GLUCOSE 114* 102* 103*  BUN 11 13 18   CREATININE 1.00 0.87 0.97  CALCIUM 9.3 9.5 9.4   Liver Function Tests: Recent Labs    01/11/20 0947 08/26/20 1234  AST 26 22  ALT 20 19  ALKPHOS 101 93  BILITOT 0.6 0.5  PROT 7.3 7.7  ALBUMIN 3.9 4.3   No results for input(s): LIPASE, AMYLASE in the last 8760 hours. No results for input(s): AMMONIA in the last 8760 hours. CBC: Recent Labs    01/11/20 0947 03/19/20 1103 08/26/20 1234 09/30/20 1657 10/07/20 0948  WBC 5.7 6.7 7.0 6.5 7.4  NEUTROABS 4.4 5.4 5.9  --   --   HGB 10.6* 11.2* 11.7*  11.5* 12.1*  HCT 32.5* 35.2* 36.9* 34.9* 37.3*  MCV 88.8 89.8 91.3 87 91.0  PLT 187 203 203 192 208   Lipid Panel: Recent Labs    08/26/20 1234  CHOL 137  HDL 75  LDLCALC 49  TRIG 66  CHOLHDL 1.8   Lab Results  Component Value Date   HGBA1C 6.2 (H) 10/16/2019    Procedures since last visit: No results found.  Assessment/Plan 1. Coronary artery disease of native artery of native heart with stable angina pectoris [facility]) Recent Cath was negative for any Issues. Stents were open Continues on statins and Beta blocker   2. Atrial flutter, unspecified type ([place]) Feels better on Digoxin Also on Eliquis Toprol dose was reduced to see if it helps his Orthostatics and Fatigue  he thinks it did helps Follows with Dr Lovena Le Has failed Amiodarone and Tikosyn before Uses Cardizem PRN   3. Other osteoarthritis involving multiple joints Uses Prednisone 5 mg Prn for Arthritis pain Follows with Dr Kathi Ludwig Also Norco BID  Get refill from her  4. MGUS (monoclonal gammopathy of unknown significance) Follows with Dr Benay Spice 5. Hyperlipidemia, unspecified hyperlipidemia type On Crestor and Zetia LDL 49 in 6/22 6. Chronic HFrEF (heart failure with reduced ejection fraction) (HCC) EF 45-50% No ARB due to Low BP  7.Gastroesophageal reflux disease without esophagitis On Protonix  8 Chronic fatigue Continues ot be issue Will follow with Dr Estanislado Pandy 9.BPH  Wants to continue Cardura    Labs/tests ordered:  * No order type specified *  Next appt:  Visit date not found

## 2021-01-01 ENCOUNTER — Other Ambulatory Visit: Payer: Self-pay | Admitting: Cardiovascular Disease

## 2021-01-01 ENCOUNTER — Other Ambulatory Visit (HOSPITAL_BASED_OUTPATIENT_CLINIC_OR_DEPARTMENT_OTHER): Payer: Self-pay

## 2021-01-01 DIAGNOSIS — I209 Angina pectoris, unspecified: Secondary | ICD-10-CM

## 2021-01-01 MED ORDER — NITROGLYCERIN 0.4 MG SL SUBL
SUBLINGUAL_TABLET | SUBLINGUAL | 5 refills | Status: DC
  Filled 2021-01-01: qty 25, 5d supply, fill #0
  Filled 2021-01-02: qty 25, 5d supply, fill #1

## 2021-01-02 ENCOUNTER — Other Ambulatory Visit (HOSPITAL_BASED_OUTPATIENT_CLINIC_OR_DEPARTMENT_OTHER): Payer: Self-pay

## 2021-01-02 ENCOUNTER — Other Ambulatory Visit: Payer: Self-pay

## 2021-01-02 DIAGNOSIS — I4891 Unspecified atrial fibrillation: Secondary | ICD-10-CM

## 2021-01-02 DIAGNOSIS — Z950 Presence of cardiac pacemaker: Secondary | ICD-10-CM

## 2021-01-02 DIAGNOSIS — I4892 Unspecified atrial flutter: Secondary | ICD-10-CM

## 2021-01-02 MED ORDER — BUTALBITAL-APAP-CAFFEINE 50-325-40 MG PO TABS
1.0000 | ORAL_TABLET | Freq: Two times a day (BID) | ORAL | 0 refills | Status: DC
  Filled 2021-01-02 – 2021-01-19 (×2): qty 60, 30d supply, fill #0

## 2021-01-02 MED ORDER — HYDROCODONE-ACETAMINOPHEN 5-325 MG PO TABS
ORAL_TABLET | ORAL | 0 refills | Status: DC
  Filled 2021-01-02: qty 90, 30d supply, fill #0

## 2021-01-02 NOTE — Telephone Encounter (Signed)
[facility] request  Patient transferring all scripts to [place].   To Dr. Lyndel Safe

## 2021-01-05 ENCOUNTER — Other Ambulatory Visit (HOSPITAL_BASED_OUTPATIENT_CLINIC_OR_DEPARTMENT_OTHER): Payer: Self-pay

## 2021-01-06 ENCOUNTER — Other Ambulatory Visit (HOSPITAL_BASED_OUTPATIENT_CLINIC_OR_DEPARTMENT_OTHER): Payer: Self-pay

## 2021-01-07 ENCOUNTER — Other Ambulatory Visit (HOSPITAL_BASED_OUTPATIENT_CLINIC_OR_DEPARTMENT_OTHER): Payer: Self-pay

## 2021-01-08 ENCOUNTER — Other Ambulatory Visit (HOSPITAL_BASED_OUTPATIENT_CLINIC_OR_DEPARTMENT_OTHER): Payer: Self-pay

## 2021-01-09 ENCOUNTER — Other Ambulatory Visit (HOSPITAL_BASED_OUTPATIENT_CLINIC_OR_DEPARTMENT_OTHER): Payer: Self-pay

## 2021-01-12 ENCOUNTER — Other Ambulatory Visit (HOSPITAL_BASED_OUTPATIENT_CLINIC_OR_DEPARTMENT_OTHER): Payer: Self-pay

## 2021-01-19 ENCOUNTER — Other Ambulatory Visit (HOSPITAL_BASED_OUTPATIENT_CLINIC_OR_DEPARTMENT_OTHER): Payer: Self-pay

## 2021-01-20 ENCOUNTER — Other Ambulatory Visit: Payer: Self-pay | Admitting: Cardiovascular Disease

## 2021-01-20 ENCOUNTER — Other Ambulatory Visit (HOSPITAL_BASED_OUTPATIENT_CLINIC_OR_DEPARTMENT_OTHER): Payer: Self-pay

## 2021-01-20 MED ORDER — ROSUVASTATIN CALCIUM 20 MG PO TABS
ORAL_TABLET | Freq: Every day | ORAL | 3 refills | Status: DC
  Filled 2021-01-20: qty 90, 90d supply, fill #0
  Filled 2021-04-20: qty 90, 90d supply, fill #1
  Filled 2021-06-30: qty 90, 90d supply, fill #2
  Filled 2021-10-19: qty 90, 90d supply, fill #3

## 2021-01-27 ENCOUNTER — Other Ambulatory Visit (HOSPITAL_BASED_OUTPATIENT_CLINIC_OR_DEPARTMENT_OTHER): Payer: Self-pay

## 2021-01-27 MED FILL — Lorazepam Tab 1 MG: ORAL | 30 days supply | Qty: 45 | Fill #1 | Status: AC

## 2021-01-28 ENCOUNTER — Other Ambulatory Visit (HOSPITAL_BASED_OUTPATIENT_CLINIC_OR_DEPARTMENT_OTHER): Payer: Self-pay

## 2021-01-28 NOTE — Progress Notes (Signed)
Office Visit Note  Patient: Kevin Buff, MD             Date of Birth: 09/19/1934           MRN: [id number]             PCP: [name], MD Referring: [name], MD Visit Date: 02/10/2021 Occupation: @GUAROCC @  Subjective:  Pain in multiple joints.   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  He states he continues to have severe pain and discomfort in multiple joints.  He has difficulty raising his shoulders.  He has neck stiffness and discomfort.  He continues to have lower back pain.  He states the pain is all over and generalized.  He is unable to function without hydrocodone which she has to take 3 times a day.  He cannot take any NSAIDs due to anticoagulation.  Activities of Daily Living:  Patient reports morning stiffness for all day. Patient Reports nocturnal pain.  Difficulty dressing/grooming: Reports Difficulty climbing stairs: Reports Difficulty getting out of chair: Reports Difficulty using hands for taps, buttons, cutlery, and/or writing: Reports  Review of Systems  Constitutional:  Positive for fatigue. Negative for night sweats.  HENT:  Negative for mouth sores, mouth dryness and nose dryness.   Eyes:  Positive for itching and dryness. Negative for pain and redness.  Respiratory:  Positive for shortness of breath. Negative for difficulty breathing.   Cardiovascular:  Positive for palpitations. Negative for chest pain, hypertension, irregular heartbeat and swelling in legs/feet.  Gastrointestinal:  Positive for constipation. Negative for blood in stool and diarrhea.  Endocrine: Negative for increased urination.  Genitourinary:  Negative for difficulty urinating.  Musculoskeletal:  Positive for joint pain, joint pain, joint swelling, myalgias, morning stiffness, muscle tenderness and myalgias. Negative for muscle weakness.  Skin:  Negative for color  change, rash, hair loss, nodules/bumps, redness, skin tightness, ulcers and sensitivity to sunlight.  Allergic/Immunologic: Negative for susceptible to infections.  Neurological:  Positive for dizziness, headaches and weakness. Negative for fainting, numbness, memory loss and night sweats.  Hematological:  Positive for bruising/bleeding tendency. Negative for swollen glands.  Psychiatric/Behavioral:  Negative for depressed mood, confusion and sleep disturbance. The patient is not nervous/anxious.    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   Urinary retention with incomplete bladder emptying 12/05/2019   Physical deconditioning 12/05/2019   Other hemorrhoids 12/05/2019   Adjustment reaction with anxiety 12/05/2019   A-fib ([place]) 10/14/2019   Atrial fibrillation with rapid ventricular response ([place]) 10/13/2019   NSTEMI (non-ST elevated myocardial infarction) ([place]) 10/13/2019   Acute diarrhea 10/13/2019   Normocytic anemia 10/13/2019   Diverticulitis sigmoid colon recurrent    Prediabetes 09/21/2017   Persistent atrial fibrillation ([place]) 04/11/2017   Vitamin D deficiency 05/20/2016   History of pacemaker 05/20/2016   History of CVA (cerebrovascular accident) 05/20/2016   Primary osteoarthritis of both hands 03/10/2016   DDD cervical spine 03/10/2016   Osteoarthritis of lumbar spine 03/10/2016   Chronic left shoulder pain 03/10/2016   Trochanteric bursitis of right hip 03/10/2016   Other fatigue 03/10/2016   Claudication in peripheral vascular disease ([place]) 03/10/2016   Chronic pain syndrome 03/10/2016  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/28/2015   Typical atrial flutter (HCC)    PVC's (premature ventricular contractions) 01/30/2015   Pacemaker  04/30/2013   IgM monoclonal gammopathy of uncertain significance 02/17/2013   Orthostatic hypotension 04/11/2011   Long term current use of anticoagulant therapy 03/24/2011   S/P AVR (aortic valve replacement) 03/24/2011   Pleural effusion 03/24/2011   Hypothyroidism 11/22/2010   Atrial flutter ([place]) 08/19/2010   Syndrome X (cardiac) ([place]) 04/06/2010   Mixed hyperlipidemia 12/08/2007   PRIMARY HYPERCOAGULABLE STATE 12/08/2007   Coronary artery disease with exertional angina ([place]) 12/08/2007   Coronary artery disease of native artery of native heart with stable angina pectoris ([place]) 12/08/2007   AORTIC STENOSIS/ INSUFFICIENCY, NON-RHEUMATIC 12/08/2007   Atrial fibrillation ([place]) 12/08/2007   Chronotropic incompetence with sinus node dysfunction ([place]) 12/08/2007    Past Medical History:  Diagnosis Date   Aortic stenosis    moderate aortic stenosis   Arthritis    Benign prostatic hypertrophy    Chronic systolic heart failure (HCC)    Chronotropic incompetence with sinus node dysfunction (HCC)    Status post Guidant dual-mode, dual-pacing, dual-sensing  pacemaker   implantation now programmed to AAI with recent generator change.   Coronary artery disease    status post multiple prior percutaneous coronary interventions, microvascular angina per Dr Olevia Perches   Diverticulitis sigmoid colon recurrent    Dysfunctional autonomic nervous system    Dyspnea    Heart murmur    History of primary hypertension    Hypercoagulable state ([place])    chronically anticoagulated with coumadin   Hyperlipidemia    Hyperthyroidism    Hypothyroidism    Dr. Elyse Hsu   MGUS (monoclonal gammopathy of unknown significance) 02/17/2013   Ocular myasthenia ([place])    Osteoarthritis    Paroxysmal atrial fibrillation ([place])    DR. Lia Foyer,    Prediabetes 09/21/2017   Stroke ([place])    1990    Family History  Problem Relation Age of Onset   Heart disease Brother        Twin brother has coronary disease and  recent AVR for AS   CAD Brother    Atrial fibrillation Brother    Sarcoidosis Brother    Depression Daughter    Anorexia nervosa Daughter    Hypertension Son    Anesthesia problems Neg Hx    Hypotension Neg Hx    Malignant hyperthermia Neg Hx    Pseudochol deficiency Neg Hx    Past Surgical History:  Procedure Laterality Date   AORTIC VALVE REPLACEMENT  03/15/2011   Procedure: AORTIC VALVE REPLACEMENT (AVR);  Surgeon: Gaye Pollack, MD;  Location: [place];  Service: Open Heart Surgery;  Laterality: N/A;   APPENDECTOMY     CARDIAC CATHETERIZATION     13   CARDIOVERSION     CARDIOVERSION  04/15/2011   Procedure: CARDIOVERSION;  Surgeon: Loralie Champagne, MD;  Location: [place];  Service: Cardiovascular;  Laterality: N/A;   CARDIOVERSION N/A 09/11/2014   Procedure: CARDIOVERSION;  Surgeon: Sueanne Margarita, MD;  Location: [facility] ENDOSCOPY;  Service: Cardiovascular;  Laterality: N/A;   CARDIOVERSION N/A 06/27/2015   Procedure: CARDIOVERSION;  Surgeon: Thayer Headings, MD;  Location: [place];  Service: Cardiovascular;  Laterality: N/A;   CARDIOVERSION N/A 07/04/2015   Procedure: CARDIOVERSION;  Surgeon: Evans Lance, MD;  Location: [place];  Service: Cardiovascular;  Laterality: N/A;   CARDIOVERSION N/A 04/13/2017   Procedure: CARDIOVERSION;  Surgeon: Sueanne Margarita, MD;  Location: [facility] ENDOSCOPY;  Service: Cardiovascular;  Laterality: N/A;   COLONOSCOPY     CORONARY STENT INTERVENTION N/A 10/15/2019   Procedure: CORONARY STENT INTERVENTION;  Surgeon: Jettie Booze, MD;  Location: [place] CV LAB;  Service: Cardiovascular;  Laterality: N/A;   EP IMPLANTABLE DEVICE N/A 06/16/2015   Procedure: Pacemaker Implant;  Surgeon: Evans Lance, MD;  Location: [place] CV LAB;  Service: Cardiovascular;  Laterality: N/A;   hemrrhoidectomy     LEFT AND RIGHT HEART CATHETERIZATION WITH CORONARY ANGIOGRAM Bilateral 02/01/2011   Procedure: LEFT AND RIGHT HEART CATHETERIZATION WITH CORONARY  ANGIOGRAM;  Surgeon: Hillary Bow, MD;  Location: [facility] CATH LAB;  Service: Cardiovascular;  Laterality: Bilateral;   LEFT HEART CATH AND CORONARY ANGIOGRAPHY N/A 10/15/2019   Procedure: LEFT HEART CATH AND CORONARY ANGIOGRAPHY;  Surgeon: Jettie Booze, MD;  Location: [place] CV LAB;  Service: Cardiovascular;  Laterality: N/A;   LEFT HEART CATH AND CORONARY ANGIOGRAPHY N/A 10/07/2020   Procedure: LEFT HEART CATH AND CORONARY ANGIOGRAPHY;  Surgeon: Sherren Mocha, MD;  Location: [place] CV LAB;  Service: Cardiovascular;  Laterality: N/A;   MAZE  03/15/2011   Procedure: MAZE;  Surgeon: Gaye Pollack, MD;  Location: [place];  Service: Open Heart Surgery;  Laterality: N/A;   PACEMAKER INSERTION  1991   Guidant PPM, most recent Generator Change by Dr Olevia Perches was 08/22/06   RIGHT/LEFT HEART CATH AND CORONARY ANGIOGRAPHY N/A 07/06/2016   Procedure: Right/Left Heart Cath and Coronary Angiography;  Surgeon: Sherren Mocha, MD;  Location: [place] CV LAB;  Service: Cardiovascular;  Laterality: N/A;   TEE WITHOUT CARDIOVERSION  04/15/2011   Procedure: TRANSESOPHAGEAL ECHOCARDIOGRAM (TEE);  Surgeon: Loralie Champagne, MD;  Location: [place];  Service: Cardiovascular;  Laterality: N/A;   TEE WITHOUT CARDIOVERSION N/A 09/11/2014   Procedure: TRANSESOPHAGEAL ECHOCARDIOGRAM (TEE);  Surgeon: Sueanne Margarita, MD;  Location[facility]COPY;  Service: Cardiovascular;  Laterality: N/A;   Social History   Social History Narrative   Married to Sherrill. Has grown children   Retired Horticulturist, commercial MD      Never smoker no alcohol      Immunization History  Administered Date(s) Administered   Influenza Split 12/30/2017   Influenza, High Dose Seasonal PF 12/28/2019   Influenza,inj,quad, With Preservative 11/29/2017   Influenza-Unspecified 12/19/2020   PFIZER(Purple Top)SARS-COV-2 Vaccination 03/13/2019, 04/02/2019, 11/01/2019     Objective: Vital Signs: BP (!) 151/90 (BP Location: Left Arm, Patient Position:  Sitting, Cuff Size: Normal)   Pulse 81   Ht 6\' 1"  (1.854 m)   Wt 226 lb (102.5 kg)   BMI 29.82 kg/m    Physical Exam Vitals and nursing note reviewed.  Constitutional:      Appearance: He is well-developed.  HENT:     Head: Normocephalic and atraumatic.  Eyes:     Conjunctiva/sclera: Conjunctivae normal.     Pupils: Pupils are equal, round, and reactive to light.  Cardiovascular:     Rate and Rhythm: Normal rate and regular rhythm.     Heart sounds: Normal heart sounds.     Comments: Pacemaker Pulmonary:     Effort: Pulmonary effort is normal.     Breath sounds: Normal breath sounds.  Abdominal:     General: Bowel sounds are normal.     Palpations: Abdomen is soft.  Musculoskeletal:     Cervical back: Normal range of motion and neck supple.  Skin:    General: Skin is warm and dry.     Capillary Refill: Capillary refill  takes less than 2 seconds.  Neurological:     Mental Status: He is alert and oriented to person, place, and time.  Psychiatric:        Behavior: Behavior normal.     Musculoskeletal Exam: He had limited range of motion of his cervical spine with discomfort.  Shoulder joint abduction was limited to 120 degrees on the right and 90 degrees on the left.  He had limited internal and external rotation of bilateral shoulders.  Shoulder elbow joints with good range of motion.  Wrist joints with good range of motion.  He has severe PIP and DIP thickening bilaterally with subluxation of the DIP joints and ankylosis of PIP and DIP joints.  He had incomplete fist formation.  Hip joints and knee joints with good range of motion.  There was no tenderness over ankles or MTPs.  Edema is was noted on bilateral feet.  He had tenderness over trochanteric bursa.  CDAI Exam: CDAI Score: -- Patient Global: --; Provider Global: -- Swollen: --; Tender: -- Joint Exam 02/10/2021   No joint exam has been documented for this visit   There is currently no information documented on  the homunculus. Go to the Rheumatology activity and complete the homunculus joint exam.  Investigation: No additional findings.  Imaging: CUP PACEART REMOTE DEVICE CHECK  Result Date: 02/06/2021 Scheduled remote reviewed. Normal device function.  Known AF/atypical flutter, conservative management with medications Eliquis, Digoxin, prescribed Next remote 91 days. LR   Recent Labs: Lab Results  Component Value Date   WBC 7.4 10/07/2020   HGB 12.1 (L) 10/07/2020   PLT 208 10/07/2020   NA 139 09/30/2020   K 4.3 09/30/2020   CL 98 09/30/2020   CO2 23 09/30/2020   GLUCOSE 103 (H) 09/30/2020   BUN 18 09/30/2020   CREATININE 0.97 09/30/2020   BILITOT 0.5 08/26/2020   ALKPHOS 93 08/26/2020   AST 22 08/26/2020   ALT 19 08/26/2020   PROT 7.7 08/26/2020   ALBUMIN 4.3 08/26/2020   CALCIUM 9.4 09/30/2020   GFRAA 92 11/26/2019    Speciality Comments: No specialty comments available.  Procedures:  No procedures performed Allergies: Contrast media [iodinated diagnostic agents], Gadolinium derivatives, and Metrizamide   Assessment / Plan:     Visit Diagnoses: Primary osteoarthritis of both shoulders-he has severe osteoarthritis of bilateral shoulder joints with limited painful range of motion.  Stretching exercises were demonstrated in the office.  Primary osteoarthritis of both hands-he has severe osteoarthritis in his hands with ankylosis of PIP and DIP joints.  He had bilateral incomplete fist formation.  Hand exercises were demonstrated in the office.  Trochanteric bursitis, right hip-he continues to have tenderness over bilateral trochanteric bursa more prominent on the right side.  Stretching exercises were discussed.  DDD (degenerative disc disease), cervical-he had limited painful range of motion of the cervical spine.  Cervical spine exercises were demonstrated.  DDD (degenerative disc disease), lumbar-chronic discomfort.  Other chronic pain -he is on hydrocodone to 5 -325 mg  3 times daily as needed.  The prescription is refilled by Dr. Lyndel Safe.  Myofascial pain-he continues to have some generalized pain and discomfort.  Gradual daily exercises were emphasized.  He has tried Cymbalta and gabapentin in the past but could not tolerate the side effects.  Other fatigue-related to chronic pain and myofascial pain.  Other medical problems are listed as follows:  S/P AVR  History of pacemaker  Other depression  Paroxysmal atrial fibrillation (HCC)  Coronary artery disease  with exertional angina [facility]) - he had MI in August and had a stent placement.    History of stroke  Primary hypercoagulable state [facility])  Adrenal insufficiency ([place]) - he has been seeing endocrinologist and is on low-dose hydrocortisone.  MGUS (monoclonal gammopathy of unknown significance) - followed by hematology.  History of diverticulitis - August 2021 treated with Cipro   Orders: No orders of the defined types were placed in this encounter.  No orders of the defined types were placed in this encounter.    Follow-Up Instructions: Return in about 6 months (around 08/11/2021) for Osteoarthritis.   Bo Merino, MD  Note - This record has been created using Editor, commissioning.  Chart creation errors have been sought, but may not always  have been located. Such creation errors do not reflect on  the standard of medical care.

## 2021-02-04 ENCOUNTER — Other Ambulatory Visit (HOSPITAL_BASED_OUTPATIENT_CLINIC_OR_DEPARTMENT_OTHER): Payer: Self-pay

## 2021-02-04 ENCOUNTER — Other Ambulatory Visit: Payer: Self-pay | Admitting: Internal Medicine

## 2021-02-04 MED ORDER — HYDROCODONE-ACETAMINOPHEN 5-325 MG PO TABS
ORAL_TABLET | ORAL | 0 refills | Status: DC
Start: 2021-02-04 — End: 2021-02-23
  Filled 2021-02-04: qty 90, 30d supply, fill #0

## 2021-02-04 NOTE — Telephone Encounter (Signed)
Patient has request refill on medication "Hydrocodone". Patient last refill was 01/02/2021. Patient doesn't have Opioid Agreement on Hydrocodone. Patient has Non Opioid agreement on medications Lorazepam, and Foricet dated 12/06/2019. Patient has upcoming appointment 07/01/2021. Update contract and add Hydrocodone contract added to patient appointment note. Medication pend and sent to Royal Hawthorn, NP for approval. Please Advise.

## 2021-02-05 ENCOUNTER — Other Ambulatory Visit (HOSPITAL_BASED_OUTPATIENT_CLINIC_OR_DEPARTMENT_OTHER): Payer: Self-pay

## 2021-02-06 ENCOUNTER — Ambulatory Visit (INDEPENDENT_AMBULATORY_CARE_PROVIDER_SITE_OTHER): Payer: Medicare Other

## 2021-02-06 DIAGNOSIS — I483 Typical atrial flutter: Secondary | ICD-10-CM | POA: Diagnosis not present

## 2021-02-06 LAB — CUP PACEART REMOTE DEVICE CHECK
Battery Remaining Longevity: 54 mo
Battery Remaining Percentage: 56 %
Brady Statistic RA Percent Paced: 0 %
Brady Statistic RV Percent Paced: 82 %
Date Time Interrogation Session: 20221209022500
Implantable Lead Implant Date: 20170417
Implantable Lead Implant Date: 20170417
Implantable Lead Location: 753859
Implantable Lead Location: 753860
Implantable Lead Model: 7740
Implantable Lead Model: 7741
Implantable Lead Serial Number: 662696
Implantable Lead Serial Number: 751382
Implantable Pulse Generator Implant Date: 20170417
Lead Channel Impedance Value: 682 Ohm
Lead Channel Impedance Value: 713 Ohm
Lead Channel Setting Pacing Amplitude: 2.5 V
Lead Channel Setting Pacing Amplitude: 2.5 V
Lead Channel Setting Pacing Pulse Width: 0.4 ms
Lead Channel Setting Sensing Sensitivity: 2.5 mV
Pulse Gen Serial Number: 718418

## 2021-02-09 ENCOUNTER — Other Ambulatory Visit (HOSPITAL_BASED_OUTPATIENT_CLINIC_OR_DEPARTMENT_OTHER): Payer: Self-pay

## 2021-02-10 ENCOUNTER — Ambulatory Visit (INDEPENDENT_AMBULATORY_CARE_PROVIDER_SITE_OTHER): Payer: Medicare Other | Admitting: Rheumatology

## 2021-02-10 ENCOUNTER — Other Ambulatory Visit: Payer: Self-pay

## 2021-02-10 ENCOUNTER — Encounter: Payer: Self-pay | Admitting: Rheumatology

## 2021-02-10 VITALS — BP 151/90 | HR 81 | Ht 73.0 in | Wt 226.0 lb

## 2021-02-10 DIAGNOSIS — E2740 Unspecified adrenocortical insufficiency: Secondary | ICD-10-CM

## 2021-02-10 DIAGNOSIS — F3289 Other specified depressive episodes: Secondary | ICD-10-CM

## 2021-02-10 DIAGNOSIS — Z950 Presence of cardiac pacemaker: Secondary | ICD-10-CM

## 2021-02-10 DIAGNOSIS — D472 Monoclonal gammopathy: Secondary | ICD-10-CM

## 2021-02-10 DIAGNOSIS — M7918 Myalgia, other site: Secondary | ICD-10-CM

## 2021-02-10 DIAGNOSIS — Z952 Presence of prosthetic heart valve: Secondary | ICD-10-CM | POA: Diagnosis not present

## 2021-02-10 DIAGNOSIS — Z8673 Personal history of transient ischemic attack (TIA), and cerebral infarction without residual deficits: Secondary | ICD-10-CM

## 2021-02-10 DIAGNOSIS — M5030 Other cervical disc degeneration, unspecified cervical region: Secondary | ICD-10-CM | POA: Diagnosis not present

## 2021-02-10 DIAGNOSIS — M5136 Other intervertebral disc degeneration, lumbar region: Secondary | ICD-10-CM | POA: Diagnosis not present

## 2021-02-10 DIAGNOSIS — G8929 Other chronic pain: Secondary | ICD-10-CM

## 2021-02-10 DIAGNOSIS — M19041 Primary osteoarthritis, right hand: Secondary | ICD-10-CM

## 2021-02-10 DIAGNOSIS — M19042 Primary osteoarthritis, left hand: Secondary | ICD-10-CM

## 2021-02-10 DIAGNOSIS — R5383 Other fatigue: Secondary | ICD-10-CM

## 2021-02-10 DIAGNOSIS — I480 Paroxysmal atrial fibrillation: Secondary | ICD-10-CM

## 2021-02-10 DIAGNOSIS — M7061 Trochanteric bursitis, right hip: Secondary | ICD-10-CM | POA: Diagnosis not present

## 2021-02-10 DIAGNOSIS — M19012 Primary osteoarthritis, left shoulder: Secondary | ICD-10-CM

## 2021-02-10 DIAGNOSIS — Z5181 Encounter for therapeutic drug level monitoring: Secondary | ICD-10-CM

## 2021-02-10 DIAGNOSIS — I209 Angina pectoris, unspecified: Secondary | ICD-10-CM

## 2021-02-10 DIAGNOSIS — M19011 Primary osteoarthritis, right shoulder: Secondary | ICD-10-CM | POA: Diagnosis not present

## 2021-02-10 DIAGNOSIS — Z8719 Personal history of other diseases of the digestive system: Secondary | ICD-10-CM

## 2021-02-10 DIAGNOSIS — D6859 Other primary thrombophilia: Secondary | ICD-10-CM

## 2021-02-10 DIAGNOSIS — I25118 Atherosclerotic heart disease of native coronary artery with other forms of angina pectoris: Secondary | ICD-10-CM

## 2021-02-12 ENCOUNTER — Emergency Department (HOSPITAL_BASED_OUTPATIENT_CLINIC_OR_DEPARTMENT_OTHER)
Admission: EM | Admit: 2021-02-12 | Discharge: 2021-02-12 | Disposition: A | Discharge status: Home / Self Care | Payer: Medicare Other | Attending: Emergency Medicine | Admitting: Emergency Medicine

## 2021-02-12 ENCOUNTER — Other Ambulatory Visit: Payer: Self-pay

## 2021-02-12 ENCOUNTER — Encounter (HOSPITAL_BASED_OUTPATIENT_CLINIC_OR_DEPARTMENT_OTHER): Payer: Self-pay

## 2021-02-12 DIAGNOSIS — M5431 Sciatica, right side: Secondary | ICD-10-CM | POA: Insufficient documentation

## 2021-02-12 DIAGNOSIS — E039 Hypothyroidism, unspecified: Secondary | ICD-10-CM | POA: Diagnosis not present

## 2021-02-12 DIAGNOSIS — Z7901 Long term (current) use of anticoagulants: Secondary | ICD-10-CM | POA: Insufficient documentation

## 2021-02-12 DIAGNOSIS — Z950 Presence of cardiac pacemaker: Secondary | ICD-10-CM | POA: Diagnosis not present

## 2021-02-12 DIAGNOSIS — Z79899 Other long term (current) drug therapy: Secondary | ICD-10-CM | POA: Insufficient documentation

## 2021-02-12 DIAGNOSIS — Z955 Presence of coronary angioplasty implant and graft: Secondary | ICD-10-CM | POA: Diagnosis not present

## 2021-02-12 DIAGNOSIS — I110 Hypertensive heart disease with heart failure: Secondary | ICD-10-CM | POA: Insufficient documentation

## 2021-02-12 DIAGNOSIS — I2510 Atherosclerotic heart disease of native coronary artery without angina pectoris: Secondary | ICD-10-CM | POA: Diagnosis not present

## 2021-02-12 DIAGNOSIS — M5441 Lumbago with sciatica, right side: Secondary | ICD-10-CM | POA: Diagnosis not present

## 2021-02-12 DIAGNOSIS — I5022 Chronic systolic (congestive) heart failure: Secondary | ICD-10-CM | POA: Diagnosis not present

## 2021-02-12 MED ORDER — DIAZEPAM 5 MG PO TABS
5.0000 mg | ORAL_TABLET | Freq: Once | ORAL | Status: AC
  Administered 2021-02-12: 5 mg via ORAL
  Filled 2021-02-12: qty 1

## 2021-02-12 MED ORDER — MORPHINE SULFATE (PF) 4 MG/ML IV SOLN
6.0000 mg | Freq: Once | INTRAVENOUS | Status: AC
  Administered 2021-02-12: 6 mg via INTRAMUSCULAR
  Filled 2021-02-12: qty 2

## 2021-02-12 MED ORDER — KETOROLAC TROMETHAMINE 30 MG/ML IJ SOLN
30.0000 mg | Freq: Once | INTRAMUSCULAR | Status: AC
  Administered 2021-02-12: 30 mg via INTRAMUSCULAR
  Filled 2021-02-12: qty 1

## 2021-02-12 MED ORDER — DIAZEPAM 2 MG PO TABS
2.0000 mg | ORAL_TABLET | Freq: Four times a day (QID) | ORAL | 0 refills | Status: DC | PRN
  Filled 2021-02-12: qty 15, 4d supply, fill #0

## 2021-02-12 MED ORDER — DEXAMETHASONE SODIUM PHOSPHATE 10 MG/ML IJ SOLN
10.0000 mg | Freq: Once | INTRAMUSCULAR | Status: AC
  Administered 2021-02-12: 10 mg via INTRAMUSCULAR
  Filled 2021-02-12: qty 1

## 2021-02-12 MED ORDER — OXYCODONE-ACETAMINOPHEN 5-325 MG PO TABS: 1.0000 | ORAL_TABLET | Freq: Four times a day (QID) | ORAL | 0 refills | Status: DC | PRN

## 2021-02-12 MED ORDER — DIAZEPAM 2 MG PO TABS: 2.0000 mg | ORAL_TABLET | Freq: Four times a day (QID) | ORAL | 0 refills | Status: DC | PRN

## 2021-02-12 MED ORDER — PREDNISONE 50 MG PO TABS: ORAL_TABLET | ORAL | 0 refills | Status: DC

## 2021-02-12 MED ORDER — PREDNISONE 50 MG PO TABS
ORAL_TABLET | ORAL | 0 refills | Status: DC
  Filled 2021-02-12: qty 4, fill #0

## 2021-02-12 MED ORDER — OXYCODONE-ACETAMINOPHEN 5-325 MG PO TABS
1.0000 | ORAL_TABLET | Freq: Four times a day (QID) | ORAL | 0 refills | Status: DC | PRN
  Filled 2021-02-12: qty 15, 2d supply, fill #0

## 2021-02-12 NOTE — ED Provider Notes (Signed)
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  86 year old male presents with right-sided sciatic pain x1 day.  Denies any injury.  States that the pain is sharp and worse with positions.  Denies any foot drop.  Pain is worse when he tries ambulate.  Has been unresponsive to his home medications.      Past Medical History:  Diagnosis Date   Aortic stenosis    moderate aortic stenosis   Arthritis    Benign prostatic hypertrophy    Chronic systolic heart failure (HCC)    Chronotropic incompetence with sinus node dysfunction (HCC)    Status post Guidant dual-mode, dual-pacing, dual-sensing  pacemaker   implantation now programmed to AAI with recent generator change.   Coronary artery disease    status post multiple prior percutaneous coronary interventions, microvascular angina per Dr Olevia Perches   Diverticulitis sigmoid colon recurrent    Dysfunctional autonomic nervous system    Dyspnea    Heart murmur    History of primary hypertension    Hypercoagulable state ([place])    chronically anticoagulated with coumadin   Hyperlipidemia    Hyperthyroidism    Hypothyroidism    Dr. 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   Urinary retention with incomplete bladder emptying 12/05/2019   Physical deconditioning 12/05/2019   Other hemorrhoids 12/05/2019   Adjustment  reaction with anxiety 12/05/2019   A-fib ([place]) 10/14/2019   Atrial fibrillation with rapid ventricular response ([place]) 10/13/2019   NSTEMI (non-ST elevated myocardial infarction) ([place]) 10/13/2019   Acute diarrhea 10/13/2019   Normocytic anemia 10/13/2019   Diverticulitis sigmoid colon recurrent    Prediabetes 09/21/2017   Persistent atrial fibrillation ([place]) 04/11/2017   Vitamin D deficiency 05/20/2016   History of pacemaker 05/20/2016   History of CVA (cerebrovascular accident) 05/20/2016   Primary osteoarthritis of both hands 03/10/2016   DDD cervical spine 03/10/2016   Osteoarthritis of lumbar spine 03/10/2016   Chronic left shoulder pain 03/10/2016   Trochanteric bursitis of right hip 03/10/2016   Other fatigue 03/10/2016   Claudication in peripheral vascular disease ([place]) 03/10/2016   Chronic pain syndrome 03/10/2016   Ocular myasthenia gravis ([place]) 10/28/2015   Typical atrial flutter (HCC)    PVC's (premature ventricular contractions) 01/30/2015   Pacemaker 04/30/2013   IgM monoclonal gammopathy of uncertain significance 02/17/2013   Orthostatic hypotension 04/11/2011   Long term current use of anticoagulant therapy 03/24/2011   S/P AVR (aortic valve replacement) 03/24/2011   Pleural effusion 03/24/2011   Hypothyroidism 11/22/2010   Atrial flutter ([place]) 08/19/2010   Syndrome X (cardiac) ([place]) 04/06/2010   Mixed hyperlipidemia 12/08/2007   PRIMARY HYPERCOAGULABLE STATE 12/08/2007   Coronary artery disease with exertional angina ([place]) 12/08/2007   Coronary artery disease of native artery of native heart with stable angina pectoris ([place]) 12/08/2007   AORTIC STENOSIS/ INSUFFICIENCY,  NON-RHEUMATIC 12/08/2007   Atrial fibrillation ([place]) 12/08/2007   Chronotropic incompetence with sinus node dysfunction ([place]) 12/08/2007    Past Surgical History:  Procedure Laterality Date   AORTIC VALVE REPLACEMENT  03/15/2011   Procedure: AORTIC VALVE REPLACEMENT (AVR);  Surgeon: Gaye Pollack, MD;  Location: [facility];  Service: Open Heart Surgery;  Laterality: N/A;   APPENDECTOMY     CARDIAC CATHETERIZATION     11   CARDIOVERSION     CARDIOVERSION  04/15/2011   Procedure: CARDIOVERSION;  Surgeon: Loralie Champagne, MD;  Location: [facility];  Service: Cardiovascular;  Laterality: N/A;   CARDIOVERSION N/A 09/11/2014   Procedure: CARDIOVERSION;  Surgeon: Sueanne Margarita, MD;  Location: [facility]OSCOPY;  Service: Cardiovascular;  Laterality: N/A;   CARDIOVERSION N/A 06/27/2015   Procedure: CARDIOVERSION;  Surgeon: Thayer Headings, MD;  Location: [facility];  Service: Cardiovascular;  Laterality: N/A;   CARDIOVERSION N/A 07/04/2015   Procedure: CARDIOVERSION;  Surgeon: Evans Lance, MD;  Location: [facility];  Service: Cardiovascular;  Laterality: N/A;   CARDIOVERSION N/A 04/13/2017   Procedure: CARDIOVERSION;  Surgeon: Sueanne Margarita, MD;  Location: [facility]COPY;  Service: Cardiovascular;  Laterality: N/A;   COLONOSCOPY     CORONARY STENT INTERVENTION N/A 10/15/2019   Procedure: CORONARY STENT INTERVENTION;  Surgeon: Jettie Booze, MD;  Location: [facility];  Service: Cardiovascular;  Laterality: N/A;   EP IMPLANTABLE DEVICE N/A 06/16/2015   Procedure: Pacemaker Implant;  Surgeon: Evans Lance, MD;  Location: [facility];  Service: Cardiovascular;  Laterality: N/A;   hemrrhoidectomy     LEFT AND RIGHT HEART CATHETERIZATION WITH CORONARY ANGIOGRAM Bilateral 02/01/2011   Procedure: LEFT AND RIGHT HEART CATHETERIZATION WITH CORONARY ANGIOGRAM;  Surgeon: Hillary Bow, MD;  Location[facility] CATH LAB;  Service: Cardiovascular;  Laterality: Bilateral;   LEFT HEART CATH AND CORONARY ANGIOGRAPHY N/A 10/15/2019   Procedure: LEFT HEART CATH AND CORONARY ANGIOGRAPHY;  Surgeon: Jettie Booze, MD;  Location: [facility] CV LAB;  Service: Cardiovascular;  Laterality: N/A;   LEFT HEART CATH AND CORONARY ANGIOGRAPHY N/A 10/07/2020   Procedure: LEFT HEART CATH AND CORONARY  ANGIOGRAPHY;  Surgeon: Sherren Mocha, MD;  Location: [facility] CV LAB;  Service: Cardiovascular;  Laterality: N/A;   MAZE  03/15/2011   Procedure: MAZE;  Surgeon: Gaye Pollack, MD;  Location: [facility];  Service: Open Heart Surgery;  Laterality: N/A;   PACEMAKER INSERTION  1991   Guidant PPM, most recent Generator Change by Dr Olevia Perches was 08/22/06   RIGHT/LEFT HEART CATH AND CORONARY ANGIOGRAPHY N/A 07/06/2016   Procedure: Right/Left Heart Cath and Coronary Angiography;  Surgeon: Sherren Mocha, MD;  Location: [facility] CV LAB;  Service: Cardiovascular;  Laterality: N/A;   TEE WITHOUT CARDIOVERSION  04/15/2011   Procedure: TRANSESOPHAGEAL ECHOCARDIOGRAM (TEE);  Surgeon: Loralie Champagne, MD;  Location: [facility];  Service: Cardiovascular;  Laterality: N/A;   TEE WITHOUT CARDIOVERSION N/A 09/11/2014   Procedure: TRANSESOPHAGEAL ECHOCARDIOGRAM (TEE);  Surgeon: Sueanne Margarita, MD;  Location: [facility];  Service: Cardiovascular;  Laterality: N/A;       Family History  Problem Relation Age of Onset   Heart disease Brother        Twin brother has coronary disease and recent AVR for AS   CAD Brother    Atrial fibrillation Brother    Sarcoidosis Brother    Depression Daughter    Anorexia nervosa Daughter    Hypertension Son    Anesthesia problems  Neg Hx    Hypotension Neg Hx    Malignant hyperthermia Neg Hx    Pseudochol deficiency Neg Hx     Social History   Tobacco Use   Smoking status: Never   Smokeless tobacco: Never  Vaping Use   Vaping Use: Never used  Substance Use Topics   Alcohol use: No    Alcohol/week: 0.0 standard drinks   Drug use: No    Home Medications Prior to Admission medications   Medication Sig Start Date End Date Taking? Authorizing Provider  apixaban (ELIQUIS) 5 MG TABS tablet TAKE 1 TABLET BY MOUTH 2 TIMES DAILY. 09/29/20 09/29/21  Sherren Mocha, MD  bisacodyl (DULCOLAX) 5 MG EC tablet Take 5 mg by mouth every other day.    [provider]   butalbital-acetaminophen-caffeine (FIORICET) 50-325-40 MG tablet Take 1 tablet by mouth 2 (two) times daily. 01/02/21   [name], MD  Cholecalciferol (VITAMIN D3) 2000 units capsule Take 2,000 Units by mouth daily.    [provider]  Coenzyme Q10 200 MG TABS Take 200 mg by mouth daily.    [provider]  desoximetasone (TOPICORT) 0.25 % cream Apply 1 application topically 2 (two) times daily. 12/31/20   [name], MD  diclofenac Sodium (VOLTAREN) 1 % GEL Apply 2 g topically 2 (two) times daily as needed (knee pain).    [provider]  digoxin (LANOXIN) 0.125 MG tablet Take 1 tablet (0.125 mg total) by mouth daily. 12/31/20   [name], MD  diltiazem (CARDIZEM) 30 MG tablet Take 30 mg by mouth as needed (heart rate).    [provider]  doxazosin (CARDURA) 4 MG tablet Take 1 tablet by mouth daily 11/06/20     ezetimibe (ZETIA) 10 MG tablet Take 1 tablet (10 mg total) by mouth daily. 06/20/20   Evans Lance, MD  HYDROcodone-acetaminophen (NORCO/VICODIN) 5-325 MG tablet TAKE 1 TABLET BY MOUTH 3 TIMES DAILY AS NEEDED FOR MODERATE PAIN 02/04/21 08/03/21  Royal Hawthorn, NP  LORazepam (ATIVAN) 1 MG tablet TAKE 1 TABLET BY MOUTH AT BEDTIME. MAY ALSO TAKE 1/2 TABLET DAILY AS NEEDED FOR ANXIETY. 11/17/20 05/16/21  Royal Hawthorn, NP  magnesium oxide (MAG-OX) 400 MG tablet Take 400 mg by mouth daily.    [provider]  metoprolol succinate (TOPROL-XL) 50 MG 24 hr tablet Take 1 tablet (50 mg total) by mouth 2 times daily. Take with or immediately following a meal. 12/10/20 12/05/21  Buford Dresser, MD  mometasone (ELOCON) 0.1 % cream Apply 1 application topically daily. 12/31/20   [name], MD  Multiple Vitamins-Minerals (MULTIVITAMIN WITH MINERALS) tablet Take 1 tablet by mouth daily.    [provider]  nitroGLYCERIN (NITROSTAT) 0.4 MG SL tablet PLACE 1 TABLET UNDER THE TONGUE EVERY 5 MINUTES AS NEEDED FOR CHEST PAIN. 01/01/21  01/01/22  Sherren Mocha, MD  OVER THE COUNTER MEDICATION Apply 1 patch topically daily as needed (neck and shoulder pain). Chinese pain patch    [provider]  pantoprazole (PROTONIX) 40 MG tablet TAKE 1 TABLET BY MOUTH DAILY. 10/21/20 10/21/21  Sherren Mocha, MD  polyethylene glycol (MIRALAX / GLYCOLAX) 17 g packet Take 17 g by mouth daily as needed for mild constipation.     [provider]  potassium chloride SA (KLOR-CON) 20 MEQ tablet TAKE 1 TABLET BY MOUTH DAILY. 03/27/20 04/01/21  Sherren Mocha, MD  predniSONE (DELTASONE) 10 MG tablet Take 1 tablet (10 mg total) by mouth daily with breakfast. Patient  taking differently: Take 10 mg by mouth daily with breakfast. 5 mg 06/25/20   [name], MD  rosuvastatin (CRESTOR) 20 MG tablet TAKE 1 TABLET (20 MG TOTAL) BY MOUTH AT BEDTIME. 01/20/21 01/20/22  Sherren Mocha, MD    Allergies    Contrast media [iodinated diagnostic agents], Gadolinium derivatives, and Metrizamide  Review of Systems   Review of Systems  All other systems reviewed and are negative.  Physical Exam Updated Vital Signs BP (!) 144/82 (BP Location: Left Arm)    Pulse 83    Temp 98.1 F (36.7 C)    Resp 20    Ht 1.854 m (6\' 1" )    Wt 102.5 kg    SpO2 94%    BMI 29.82 kg/m   Physical Exam Vitals and nursing note reviewed.  Constitutional:      General: He is not in acute distress.    Appearance: Normal appearance. He is well-developed. He is not toxic-appearing.  HENT:     Head: Normocephalic and atraumatic.  Eyes:     General: Lids are normal.     Conjunctiva/sclera: Conjunctivae normal.     Pupils: Pupils are equal, round, and reactive to light.  Neck:     Thyroid: No thyroid mass.     Trachea: No tracheal deviation.  Cardiovascular:     Rate and Rhythm: Normal rate and regular rhythm.     Heart sounds: Normal heart sounds. No murmur heard.   No gallop.  Pulmonary:     Effort: Pulmonary effort is normal. No respiratory distress.      Breath sounds: Normal breath sounds. No stridor. No decreased breath sounds, wheezing, rhonchi or rales.  Abdominal:     General: There is no distension.     Palpations: Abdomen is soft.     Tenderness: There is no abdominal tenderness. There is no rebound.  Musculoskeletal:        General: No tenderness. Normal range of motion.     Cervical back: Normal range of motion and neck supple.       Legs:     Comments: Neurovascular status intact at right foot  Skin:    General: Skin is warm and dry.     Findings: No abrasion or rash.  Neurological:     Mental Status: He is alert and oriented to person, place, and time. Mental status is at baseline.     GCS: GCS eye subscore is 4. GCS verbal subscore is 5. GCS motor subscore is 6.     Cranial Nerves: No cranial nerve deficit.     Sensory: No sensory deficit.     Motor: Motor function is intact.  Psychiatric:        Attention and Perception: Attention normal.        Speech: Speech normal.        Behavior: Behavior normal.    ED Results / Procedures / Treatments   Labs (all labs ordered are listed, but only abnormal results are displayed) Labs Reviewed - No data to display  EKG None  Radiology No results found.  Procedures Procedures   Medications Ordered in ED Medications  diazepam (VALIUM) tablet 5 mg (has no administration in time range)  morphine 4 MG/ML injection 6 mg (has no administration in time range)    ED Course  I have reviewed the triage vital signs and the nursing notes.  Pertinent labs & imaging results that were available during my care of the patient were reviewed by  me and considered in my medical decision making (see chart for details).    MDM Rules/Calculators/A&P                         Patient medicated for pain x3 here.  Patient is able to stand at this point.  Will give dose of Decadron here as well as place on medications for pain and muscle laxation.  He will follow-up with his  orthopedist    Final Clinical Impression(s) / ED Diagnoses Final diagnoses:  None    Rx / DC Orders ED Discharge Orders     None        [name], MD 02/12/21 1846

## 2021-02-12 NOTE — ED Triage Notes (Signed)
Pt presents with Right hip pain, hx of chronic Right hip pain (bursitis), but this am it felt like he was gong to "pass out" when he went to the bathroom. Pt reports pain is in his Right buttocks radiating down into his leg, unable to stand, only relief is with lifting his leg. Constant burning pain.  Pt took 2 tabs Vicodin 5/325 mg and 30 mg Prednisone this am

## 2021-02-12 NOTE — ED Notes (Signed)
RN provided AVS using Teachback Method. Patient verbalizes understanding of Discharge Instructions. Opportunity for Questioning and Answers were provided by RN. Patient Discharged from ED in wheelchair to Home. 

## 2021-02-13 ENCOUNTER — Other Ambulatory Visit (HOSPITAL_BASED_OUTPATIENT_CLINIC_OR_DEPARTMENT_OTHER): Payer: Self-pay

## 2021-02-13 ENCOUNTER — Other Ambulatory Visit (HOSPITAL_COMMUNITY): Payer: Self-pay

## 2021-02-13 DIAGNOSIS — M5451 Vertebrogenic low back pain: Secondary | ICD-10-CM | POA: Diagnosis not present

## 2021-02-13 DIAGNOSIS — M1611 Unilateral primary osteoarthritis, right hip: Secondary | ICD-10-CM | POA: Diagnosis not present

## 2021-02-13 MED ORDER — TIZANIDINE HCL 2 MG PO TABS
2.0000 mg | ORAL_TABLET | Freq: Two times a day (BID) | ORAL | 0 refills | Status: DC
  Filled 2021-02-13 – 2021-02-18 (×2): qty 14, 7d supply, fill #0

## 2021-02-16 ENCOUNTER — Other Ambulatory Visit: Payer: Self-pay | Admitting: Internal Medicine

## 2021-02-16 ENCOUNTER — Telehealth: Payer: Self-pay

## 2021-02-16 ENCOUNTER — Other Ambulatory Visit: Payer: Self-pay | Admitting: Medical

## 2021-02-16 ENCOUNTER — Other Ambulatory Visit (HOSPITAL_BASED_OUTPATIENT_CLINIC_OR_DEPARTMENT_OTHER): Payer: Self-pay

## 2021-02-16 ENCOUNTER — Other Ambulatory Visit: Payer: Self-pay | Admitting: Orthopedic Surgery

## 2021-02-16 DIAGNOSIS — M5451 Vertebrogenic low back pain: Secondary | ICD-10-CM

## 2021-02-16 DIAGNOSIS — M5416 Radiculopathy, lumbar region: Secondary | ICD-10-CM

## 2021-02-16 MED ORDER — TIZANIDINE HCL 2 MG PO TABS
ORAL_TABLET | ORAL | 0 refills | Status: DC
  Filled 2021-02-16 – 2021-02-17 (×2): qty 14, 7d supply, fill #0

## 2021-02-16 NOTE — Progress Notes (Signed)
Phone call to patient to review instructions for 13 hr prep for CT Myelogram w/ contrast on 02/17/21 at 9:30 AM. I spoke to pt and his private nurse regarding instructions. Pt reports he currently takes 50 mg of prednisone daily. Advised pt to skip his 50 mg daily dose tomorrow, while taking this prescription, resume his daily dose on 02/18/21. Prescription called into [facility]. Pt aware and verbalized understanding of instructions. Prescription: 8:30 PM- 50mg  Prednisone 2:30 AM- 50mg  Prednisone 8:30 AM - 50mg  Prednisone and 50mg  Benadryl   Benadryl was also called in as a prescription, per to the patients request. Advised pt not to drive the day of taking this medication as it may cause drowsiness. Pt verbalized understanding.

## 2021-02-16 NOTE — Progress Notes (Signed)
Remote pacemaker transmission.   

## 2021-02-17 ENCOUNTER — Ambulatory Visit
Admission: RE | Admit: 2021-02-17 | Discharge: 2021-02-17 | Disposition: A | Discharge status: Home / Self Care | Payer: Medicare Other | Source: Ambulatory Visit | Attending: Orthopedic Surgery | Admitting: Orthopedic Surgery

## 2021-02-17 ENCOUNTER — Other Ambulatory Visit (HOSPITAL_BASED_OUTPATIENT_CLINIC_OR_DEPARTMENT_OTHER): Payer: Self-pay

## 2021-02-17 ENCOUNTER — Other Ambulatory Visit: Payer: Self-pay | Admitting: Nurse Practitioner

## 2021-02-17 ENCOUNTER — Other Ambulatory Visit: Payer: Medicare Other

## 2021-02-17 ENCOUNTER — Other Ambulatory Visit: Payer: Self-pay

## 2021-02-17 DIAGNOSIS — M48061 Spinal stenosis, lumbar region without neurogenic claudication: Secondary | ICD-10-CM | POA: Diagnosis not present

## 2021-02-17 DIAGNOSIS — M5416 Radiculopathy, lumbar region: Secondary | ICD-10-CM

## 2021-02-17 DIAGNOSIS — M5116 Intervertebral disc disorders with radiculopathy, lumbar region: Secondary | ICD-10-CM | POA: Diagnosis not present

## 2021-02-17 MED ORDER — IOPAMIDOL (ISOVUE-M 200) INJECTION 41%
20.0000 mL | Freq: Once | INTRAMUSCULAR | Status: AC
  Administered 2021-02-17: 11:00:00 20 mL via INTRATHECAL

## 2021-02-17 MED ORDER — ONDANSETRON HCL 4 MG/2ML IJ SOLN
4.0000 mg | Freq: Once | INTRAMUSCULAR | Status: AC | PRN
  Administered 2021-02-17: 10:00:00 4 mg via INTRAMUSCULAR

## 2021-02-17 MED ORDER — MEPERIDINE HCL 50 MG/ML IJ SOLN
50.0000 mg | Freq: Once | INTRAMUSCULAR | Status: AC | PRN
  Administered 2021-02-17: 10:00:00 50 mg via INTRAMUSCULAR

## 2021-02-17 MED ORDER — DIAZEPAM 5 MG PO TABS
5.0000 mg | ORAL_TABLET | Freq: Once | ORAL | Status: AC
  Administered 2021-02-17: 09:00:00 5 mg via ORAL

## 2021-02-17 NOTE — Discharge Instr - Other Orders (Addendum)
1012: Pt reports pain 6/10 during myelogram procedure, see MAR.  1040: Pt reports relief in pain from pain medication.  1120: pts BP high, pt has no complaints at this time. Per pts nurse, kim, he did not take his BP medication this morning. Advised pt to go home and take BP medications and recheck BP around 1 hour after. Call PCP if still high.

## 2021-02-17 NOTE — Discharge Instructions (Signed)

## 2021-02-18 ENCOUNTER — Other Ambulatory Visit (HOSPITAL_BASED_OUTPATIENT_CLINIC_OR_DEPARTMENT_OTHER): Payer: Self-pay

## 2021-02-19 ENCOUNTER — Other Ambulatory Visit (HOSPITAL_COMMUNITY): Payer: Self-pay

## 2021-02-19 ENCOUNTER — Other Ambulatory Visit (HOSPITAL_BASED_OUTPATIENT_CLINIC_OR_DEPARTMENT_OTHER): Payer: Self-pay

## 2021-02-19 ENCOUNTER — Other Ambulatory Visit: Payer: Self-pay | Admitting: Internal Medicine

## 2021-02-19 DIAGNOSIS — I4891 Unspecified atrial fibrillation: Secondary | ICD-10-CM

## 2021-02-19 DIAGNOSIS — Z950 Presence of cardiac pacemaker: Secondary | ICD-10-CM

## 2021-02-19 DIAGNOSIS — M5416 Radiculopathy, lumbar region: Secondary | ICD-10-CM | POA: Diagnosis not present

## 2021-02-19 DIAGNOSIS — I4892 Unspecified atrial flutter: Secondary | ICD-10-CM

## 2021-02-19 MED ORDER — BUTALBITAL-APAP-CAFFEINE 50-325-40 MG PO TABS
1.0000 | ORAL_TABLET | Freq: Two times a day (BID) | ORAL | 0 refills | Status: DC
  Filled 2021-02-19: qty 60, 30d supply, fill #0

## 2021-02-19 NOTE — Telephone Encounter (Signed)
[facility]   RX last refilled on 01/02/2021. No treatment agreement on file, and notation already made on pending appointment for 07/01/21 to sign agreement.

## 2021-02-23 ENCOUNTER — Other Ambulatory Visit: Payer: Self-pay

## 2021-02-23 ENCOUNTER — Ambulatory Visit (HOSPITAL_COMMUNITY): Payer: Medicare Other | Admitting: Anesthesiology

## 2021-02-23 ENCOUNTER — Encounter (HOSPITAL_COMMUNITY): Payer: Self-pay | Admitting: Neurological Surgery

## 2021-02-23 ENCOUNTER — Ambulatory Visit (HOSPITAL_COMMUNITY): Payer: Medicare Other

## 2021-02-23 ENCOUNTER — Ambulatory Visit (HOSPITAL_COMMUNITY)
Admission: RE | Admit: 2021-02-23 | Discharge: 2021-02-23 | Disposition: A | Discharge status: Home / Self Care | Payer: Medicare Other | Attending: Neurological Surgery | Admitting: Neurological Surgery

## 2021-02-23 ENCOUNTER — Encounter (HOSPITAL_COMMUNITY)
Admission: RE | Disposition: A | Discharge status: Home / Self Care | Payer: Self-pay | Source: Home / Self Care | Attending: Neurological Surgery

## 2021-02-23 DIAGNOSIS — Z7901 Long term (current) use of anticoagulants: Secondary | ICD-10-CM | POA: Insufficient documentation

## 2021-02-23 DIAGNOSIS — I252 Old myocardial infarction: Secondary | ICD-10-CM | POA: Diagnosis not present

## 2021-02-23 DIAGNOSIS — Z419 Encounter for procedure for purposes other than remedying health state, unspecified: Secondary | ICD-10-CM

## 2021-02-23 DIAGNOSIS — M5126 Other intervertebral disc displacement, lumbar region: Secondary | ICD-10-CM | POA: Diagnosis not present

## 2021-02-23 DIAGNOSIS — M5116 Intervertebral disc disorders with radiculopathy, lumbar region: Secondary | ICD-10-CM | POA: Insufficient documentation

## 2021-02-23 DIAGNOSIS — I2510 Atherosclerotic heart disease of native coronary artery without angina pectoris: Secondary | ICD-10-CM | POA: Insufficient documentation

## 2021-02-23 DIAGNOSIS — I10 Essential (primary) hypertension: Secondary | ICD-10-CM | POA: Insufficient documentation

## 2021-02-23 DIAGNOSIS — I4819 Other persistent atrial fibrillation: Secondary | ICD-10-CM | POA: Diagnosis not present

## 2021-02-23 DIAGNOSIS — Z8673 Personal history of transient ischemic attack (TIA), and cerebral infarction without residual deficits: Secondary | ICD-10-CM | POA: Diagnosis not present

## 2021-02-23 DIAGNOSIS — F419 Anxiety disorder, unspecified: Secondary | ICD-10-CM | POA: Diagnosis not present

## 2021-02-23 DIAGNOSIS — Z950 Presence of cardiac pacemaker: Secondary | ICD-10-CM | POA: Insufficient documentation

## 2021-02-23 DIAGNOSIS — Z952 Presence of prosthetic heart valve: Secondary | ICD-10-CM | POA: Insufficient documentation

## 2021-02-23 DIAGNOSIS — I4891 Unspecified atrial fibrillation: Secondary | ICD-10-CM | POA: Insufficient documentation

## 2021-02-23 DIAGNOSIS — Z4789 Encounter for other orthopedic aftercare: Secondary | ICD-10-CM | POA: Diagnosis not present

## 2021-02-23 DIAGNOSIS — M48061 Spinal stenosis, lumbar region without neurogenic claudication: Secondary | ICD-10-CM | POA: Diagnosis not present

## 2021-02-23 DIAGNOSIS — Z79899 Other long term (current) drug therapy: Secondary | ICD-10-CM | POA: Insufficient documentation

## 2021-02-23 DIAGNOSIS — Z955 Presence of coronary angioplasty implant and graft: Secondary | ICD-10-CM | POA: Diagnosis not present

## 2021-02-23 DIAGNOSIS — I214 Non-ST elevation (NSTEMI) myocardial infarction: Secondary | ICD-10-CM | POA: Diagnosis not present

## 2021-02-23 HISTORY — PX: LUMBAR LAMINECTOMY/DECOMPRESSION MICRODISCECTOMY: SHX5026

## 2021-02-23 LAB — CBC
HCT: 40.8 % (ref 39.0–52.0)
Hemoglobin: 12.4 g/dL — ABNORMAL LOW (ref 13.0–17.0)
MCH: 27.8 pg (ref 26.0–34.0)
MCHC: 30.4 g/dL (ref 30.0–36.0)
MCV: 91.5 fL (ref 80.0–100.0)
Platelets: 219 10*3/uL (ref 150–400)
RBC: 4.46 MIL/uL (ref 4.22–5.81)
RDW: 14.1 % (ref 11.5–15.5)
WBC: 14 10*3/uL — ABNORMAL HIGH (ref 4.0–10.5)
nRBC: 0 % (ref 0.0–0.2)

## 2021-02-23 LAB — BASIC METABOLIC PANEL
Anion gap: 8 (ref 5–15)
BUN: 18 mg/dL (ref 8–23)
CO2: 29 mmol/L (ref 22–32)
Calcium: 9.2 mg/dL (ref 8.9–10.3)
Chloride: 101 mmol/L (ref 98–111)
Creatinine, Ser: 0.93 mg/dL (ref 0.61–1.24)
GFR, Estimated: >60 mL/min (ref >60)
Glucose, Bld: 166 mg/dL — ABNORMAL HIGH (ref 70–99)
Potassium: 4.4 mmol/L (ref 3.5–5.1)
Sodium: 138 mmol/L (ref 135–145)

## 2021-02-23 LAB — URINALYSIS, ROUTINE W REFLEX MICROSCOPIC
Bilirubin Urine: NEGATIVE
Glucose, UA: NEGATIVE mg/dL
Hgb urine dipstick: NEGATIVE
Ketones, ur: NEGATIVE mg/dL
Leukocytes,Ua: NEGATIVE
Nitrite: NEGATIVE
Protein, ur: NEGATIVE mg/dL
Specific Gravity, Urine: 1.015 (ref 1.005–1.030)
pH: 7 (ref 5.0–8.0)

## 2021-02-23 LAB — SURGICAL PCR SCREEN
MRSA, PCR: NEGATIVE
Staphylococcus aureus: NEGATIVE

## 2021-02-23 SURGERY — LUMBAR LAMINECTOMY/DECOMPRESSION MICRODISCECTOMY 1 LEVEL
Anesthesia: General | Site: Back | Laterality: Right

## 2021-02-23 MED ORDER — FENTANYL CITRATE (PF) 250 MCG/5ML IJ SOLN
INTRAMUSCULAR | Status: DC | PRN
  Administered 2021-02-23 (×2): 50 ug via INTRAVENOUS
  Administered 2021-02-23: 100 ug via INTRAVENOUS

## 2021-02-23 MED ORDER — CHLORHEXIDINE GLUCONATE 0.12 % MT SOLN: 15.0000 mL | Freq: Once | OROMUCOSAL | Status: AC

## 2021-02-23 MED ORDER — THROMBIN 5000 UNITS EX SOLR
CUTANEOUS | Status: AC
  Filled 2021-02-23: qty 5000

## 2021-02-23 MED ORDER — ORAL CARE MOUTH RINSE: 15.0000 mL | Freq: Once | OROMUCOSAL | Status: AC

## 2021-02-23 MED ORDER — OXYCODONE-ACETAMINOPHEN 5-325 MG PO TABS
1.0000 | ORAL_TABLET | ORAL | 0 refills | Status: DC | PRN
  Filled 2021-02-23: qty 30, 5d supply, fill #0

## 2021-02-23 MED ORDER — OXYCODONE HCL 5 MG PO TABS
5.0000 mg | ORAL_TABLET | Freq: Once | ORAL | Status: AC
  Administered 2021-02-23: 17:00:00 5 mg via ORAL

## 2021-02-23 MED ORDER — OXYCODONE HCL 5 MG PO TABS
ORAL_TABLET | ORAL | Status: AC
  Filled 2021-02-23: qty 1

## 2021-02-23 MED ORDER — HYDRALAZINE HCL 20 MG/ML IJ SOLN
5.0000 mg | INTRAMUSCULAR | Status: AC | PRN
  Administered 2021-02-23 (×2): 5 mg via INTRAVENOUS

## 2021-02-23 MED ORDER — BUPIVACAINE HCL (PF) 0.25 % IJ SOLN
INTRAMUSCULAR | Status: DC | PRN
  Administered 2021-02-23: 2 mL

## 2021-02-23 MED ORDER — SUGAMMADEX SODIUM 200 MG/2ML IV SOLN
INTRAVENOUS | Status: DC | PRN
  Administered 2021-02-23: 200 mg via INTRAVENOUS

## 2021-02-23 MED ORDER — LACTATED RINGERS IV SOLN: INTRAVENOUS | Status: DC

## 2021-02-23 MED ORDER — CEFAZOLIN SODIUM-DEXTROSE 2-4 GM/100ML-% IV SOLN
INTRAVENOUS | Status: AC
  Filled 2021-02-23: qty 100

## 2021-02-23 MED ORDER — 0.9 % SODIUM CHLORIDE (POUR BTL) OPTIME
TOPICAL | Status: DC | PRN
  Administered 2021-02-23: 16:00:00 1000 mL

## 2021-02-23 MED ORDER — DEXAMETHASONE SODIUM PHOSPHATE 10 MG/ML IJ SOLN
INTRAMUSCULAR | Status: DC | PRN
  Administered 2021-02-23: 4 mg via INTRAVENOUS

## 2021-02-23 MED ORDER — THROMBIN 5000 UNITS EX SOLR: OROMUCOSAL | Status: DC | PRN

## 2021-02-23 MED ORDER — CEFAZOLIN SODIUM-DEXTROSE 2-3 GM-%(50ML) IV SOLR
INTRAVENOUS | Status: DC | PRN
  Administered 2021-02-23: 2 g via INTRAVENOUS

## 2021-02-23 MED ORDER — CHLORHEXIDINE GLUCONATE 0.12 % MT SOLN
OROMUCOSAL | Status: AC
  Administered 2021-02-23: 14:00:00 15 mL via OROMUCOSAL
  Filled 2021-02-23: qty 15

## 2021-02-23 MED ORDER — ROCURONIUM BROMIDE 10 MG/ML (PF) SYRINGE
PREFILLED_SYRINGE | INTRAVENOUS | Status: DC | PRN
  Administered 2021-02-23: 50 mg via INTRAVENOUS

## 2021-02-23 MED ORDER — PHENYLEPHRINE HCL-NACL 20-0.9 MG/250ML-% IV SOLN
INTRAVENOUS | Status: DC | PRN
  Administered 2021-02-23: 10 ug/min via INTRAVENOUS

## 2021-02-23 MED ORDER — ONDANSETRON HCL 4 MG/2ML IJ SOLN: 4.0000 mg | Freq: Once | INTRAMUSCULAR | Status: DC | PRN

## 2021-02-23 MED ORDER — FENTANYL CITRATE (PF) 100 MCG/2ML IJ SOLN
25.0000 ug | INTRAMUSCULAR | Status: DC | PRN
Start: 2021-02-23 — End: 2021-02-24

## 2021-02-23 MED ORDER — ONDANSETRON HCL 4 MG/2ML IJ SOLN
INTRAMUSCULAR | Status: DC | PRN
  Administered 2021-02-23: 4 mg via INTRAVENOUS

## 2021-02-23 MED ORDER — BUPIVACAINE HCL (PF) 0.25 % IJ SOLN
INTRAMUSCULAR | Status: AC
  Filled 2021-02-23: qty 30

## 2021-02-23 MED ORDER — FENTANYL CITRATE (PF) 250 MCG/5ML IJ SOLN
INTRAMUSCULAR | Status: AC
  Filled 2021-02-23: qty 5

## 2021-02-23 MED ORDER — PROPOFOL 10 MG/ML IV BOLUS
INTRAVENOUS | Status: DC | PRN
  Administered 2021-02-23: 100 mg via INTRAVENOUS
  Administered 2021-02-23: 50 mg via INTRAVENOUS

## 2021-02-23 MED ORDER — OXYCODONE-ACETAMINOPHEN 5-325 MG PO TABS: 1.0000 | ORAL_TABLET | ORAL | 0 refills | Status: DC | PRN

## 2021-02-23 MED ORDER — LIDOCAINE 2% (20 MG/ML) 5 ML SYRINGE
INTRAMUSCULAR | Status: DC | PRN
  Administered 2021-02-23: 60 mg via INTRAVENOUS

## 2021-02-23 MED ORDER — HYDRALAZINE HCL 20 MG/ML IJ SOLN
INTRAMUSCULAR | Status: AC
  Filled 2021-02-23: qty 1

## 2021-02-23 SURGICAL SUPPLY — 47 items
ADH SKN CLS APL DERMABOND .7 (GAUZE/BANDAGES/DRESSINGS) ×1
APL SKNCLS STERI-STRIP NONHPOA (GAUZE/BANDAGES/DRESSINGS) ×1
BAG COUNTER SPONGE SURGICOUNT (BAG) ×3 IMPLANT
BAG SPNG CNTER NS LX DISP (BAG) ×1
BAG SURGICOUNT SPONGE COUNTING (BAG) ×1
BAND INSRT 18 STRL LF DISP RB (MISCELLANEOUS) ×2
BAND RUBBER #18 3X1/16 STRL (MISCELLANEOUS) ×8 IMPLANT
BENZOIN TINCTURE PRP APPL 2/3 (GAUZE/BANDAGES/DRESSINGS) ×4 IMPLANT
BUR CARBIDE MATCH 3.0 (BURR) ×4 IMPLANT
CANISTER SUCT 3000ML PPV (MISCELLANEOUS) ×4 IMPLANT
CLOSURE WOUND 1/2 X4 (GAUZE/BANDAGES/DRESSINGS) ×1
DERMABOND ADVANCED (GAUZE/BANDAGES/DRESSINGS) ×2
DERMABOND ADVANCED .7 DNX12 (GAUZE/BANDAGES/DRESSINGS) IMPLANT
DRAPE LAPAROTOMY 100X72X124 (DRAPES) ×4 IMPLANT
DRAPE MICROSCOPE LEICA (MISCELLANEOUS) ×4 IMPLANT
DRAPE SURG 17X23 STRL (DRAPES) ×2 IMPLANT
DRSG OPSITE POSTOP 3X4 (GAUZE/BANDAGES/DRESSINGS) ×2 IMPLANT
DURAPREP 26ML APPLICATOR (WOUND CARE) ×4 IMPLANT
ELECT REM PT RETURN 9FT ADLT (ELECTROSURGICAL) ×3
ELECTRODE REM PT RTRN 9FT ADLT (ELECTROSURGICAL) ×2 IMPLANT
GAUZE 4X4 16PLY ~~LOC~~+RFID DBL (SPONGE) ×2 IMPLANT
GLOVE SURG ENC MOIS LTX SZ7 (GLOVE) ×2 IMPLANT
GLOVE SURG ENC MOIS LTX SZ8 (GLOVE) ×4 IMPLANT
GLOVE SURG UNDER POLY LF SZ7 (GLOVE) ×2 IMPLANT
GOWN STRL REUS W/ TWL LRG LVL3 (GOWN DISPOSABLE) IMPLANT
GOWN STRL REUS W/ TWL XL LVL3 (GOWN DISPOSABLE) ×2 IMPLANT
GOWN STRL REUS W/TWL 2XL LVL3 (GOWN DISPOSABLE) IMPLANT
GOWN STRL REUS W/TWL LRG LVL3 (GOWN DISPOSABLE) ×6
GOWN STRL REUS W/TWL XL LVL3 (GOWN DISPOSABLE) ×6
HEMOSTAT POWDER KIT SURGIFOAM (HEMOSTASIS) ×4 IMPLANT
KIT BASIN OR (CUSTOM PROCEDURE TRAY) ×4 IMPLANT
KIT TURNOVER KIT B (KITS) ×4 IMPLANT
NDL HYPO 25X1 1.5 SAFETY (NEEDLE) ×2 IMPLANT
NDL SPNL 20GX3.5 QUINCKE YW (NEEDLE) IMPLANT
NEEDLE HYPO 25X1 1.5 SAFETY (NEEDLE) ×3 IMPLANT
NEEDLE SPNL 20GX3.5 QUINCKE YW (NEEDLE) ×3 IMPLANT
NS IRRIG 1000ML POUR BTL (IV SOLUTION) ×4 IMPLANT
PACK LAMINECTOMY NEURO (CUSTOM PROCEDURE TRAY) ×4 IMPLANT
PAD ARMBOARD 7.5X6 YLW CONV (MISCELLANEOUS) ×12 IMPLANT
STRIP CLOSURE SKIN 1/2X4 (GAUZE/BANDAGES/DRESSINGS) ×3 IMPLANT
SUT VIC AB 0 CT1 18XCR BRD8 (SUTURE) ×2 IMPLANT
SUT VIC AB 0 CT1 8-18 (SUTURE) ×3
SUT VIC AB 2-0 CP2 18 (SUTURE) ×4 IMPLANT
SUT VIC AB 3-0 SH 8-18 (SUTURE) ×4 IMPLANT
TOWEL GREEN STERILE (TOWEL DISPOSABLE) ×4 IMPLANT
TOWEL GREEN STERILE FF (TOWEL DISPOSABLE) ×4 IMPLANT
WATER STERILE IRR 1000ML POUR (IV SOLUTION) ×4 IMPLANT

## 2021-02-23 NOTE — Op Note (Signed)
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 Imaging showed herniated disc L4-5 right with subarticular recess stenosis and underfilling of the right L5 nerve root. The patient tried conservative measures without relief. Pain was debilitating. Recommended decompressive hemilaminectomy with microdiscectomy. Patient understood the risks, benefits, and alternatives and potential outcomes and wished to proceed.  PROCEDURE DETAILS: The patient was taken to the operating room and after induction of adequate generalized endotracheal anesthesia, the patient was rolled into the prone position on the Wilson frame and all pressure points were padded. The lumbar region was cleaned and then prepped with DuraPrep and draped in the usual sterile fashion. 5 cc of local anesthesia was injected and then a dorsal midline incision was made and carried down to the lumbo sacral fascia. The fascia was opened and the paraspinous musculature was taken down in a subperiosteal fashion to expose L4-5 on the right. Intraoperative x-ray confirmed my level, and then I used a combination of the high-speed drill and the Kerrison punches to perform a hemilaminectomy, medial facetectomy, and foraminotomy at L4-5 on the right. The underlying yellow ligament was opened and removed in a piecemeal fashion to expose the underlying dura and  exiting nerve root. I undercut the lateral recess and dissected down until I was medial to and distal to the pedicle. The nerve root was well decompressed. We then gently retracted the nerve root medially with a retractor, coagulated the epidural venous vasculature, and incised the disc space. I performed a thorough intradiscal discectomy with pituitary rongeurs and curettes, until I had a nice decompression of the nerve root and the midline. I then palpated with a coronary dilator along the nerve root and into the foramen to assure adequate decompression. I felt no more compression of the nerve root. I irrigated with saline solution containing bacitracin. Achieved hemostasis with bipolar cautery, lined the dura with Gelfoam, and then closed the fascia with 0 Vicryl. I closed the subcutaneous tissues with 2-0 Vicryl and the subcuticular tissues with 3-0 Vicryl. The skin was then closed with benzoin and Steri-Strips. The drapes were removed, a sterile dressing was applied.  My nurse practitioner was involved in the exposure, safe retraction of the neural elements, the disc work and the closure. the patient was awakened from general anesthesia and transferred to the recovery room in stable condition. At the end of the procedure all sponge, needle and instrument counts were correct.    PLAN OF CARE: Discharge to home after PACU  PATIENT DISPOSITION:  PACU - hemodynamically stable.   Delay start of Pharmacological VTE agent (>24hrs) due to surgical blood loss or risk of bleeding:  yes

## 2021-02-23 NOTE — H&P (Signed)
Subjective: Patient is a 86 y.o. male admitted for R leg pain. Onset of symptoms was a few weeks ago, rapidly worsening since that time.  The pain is rated intense, unremitting, and is located at the across the lower back and radiates to RLE with tingling. The pain is described as aching and occurs all day. The symptoms have been progressive. Symptoms are exacerbated by nothing in particular. MRI or CT showed hnp/ stenosis L4-5 R   Past Medical History:  Diagnosis Date   Aortic stenosis    moderate aortic stenosis   Arthritis    Benign prostatic hypertrophy    Chronic systolic heart failure (HCC)    Chronotropic incompetence with sinus node dysfunction (HCC)    Status post Guidant dual-mode, dual-pacing, dual-sensing  pacemaker   implantation now programmed to AAI with recent generator change.   Coronary artery disease    status post multiple prior percutaneous coronary interventions, microvascular angina per Dr Olevia Perches   Diverticulitis sigmoid colon recurrent    Dysfunctional autonomic nervous system    Dyspnea    Heart murmur    History of primary hypertension    Hypercoagulable state ([place])    chronically anticoagulated with coumadin   Hyperlipidemia    Hyperthyroidism    Hypothyroidism    Dr. 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 Lia Foyer,    Prediabetes 09/21/2017   Stroke ([place])    1990    Past Surgical History:  Procedure Laterality Date   AORTIC VALVE REPLACEMENT  03/15/2011   Procedure: AORTIC VALVE REPLACEMENT (AVR);  Surgeon: Gaye Pollack, MD;  Location: [place];  Service: Open Heart Surgery;  Laterality: N/A;   APPENDECTOMY     CARDIAC CATHETERIZATION     11   CARDIOVERSION     CARDIOVERSION  04/15/2011   Procedure: CARDIOVERSION;  Surgeon: Loralie Champagne, MD;  Location: [place];  Service: Cardiovascular;  Laterality: N/A;    CARDIOVERSION N/A 09/11/2014   Procedure: CARDIOVERSION;  Surgeon: Sueanne Margarita, MD;  Location: [facility];  Service: Cardiovascular;  Laterality: N/A;   CARDIOVERSION N/A 06/27/2015   Procedure: CARDIOVERSION;  Surgeon: Thayer Headings, MD;  Location: [place];  Service: Cardiovascular;  Laterality: N/A;   CARDIOVERSION N/A 07/04/2015   Procedure: CARDIOVERSION;  Surgeon: Evans Lance, MD;  Location: [place];  Service: Cardiovascular;  Laterality: N/A;   CARDIOVERSION N/A 04/13/2017   Procedure: CARDIOVERSION;  Surgeon: Sueanne Margarita, MD;  Location: [facility] ENDOSCOPY;  Service: Cardiovascular;  Laterality: N/A;   COLONOSCOPY     CORONARY STENT INTERVENTION N/A 10/15/2019   Procedure: CORONARY STENT INTERVENTION;  Surgeon: Jettie Booze, MD;  Location: [place] [facility];  Service: Cardiovascular;  Laterality: N/A;   EP IMPLANTABLE DEVICE N/A 06/16/2015   Procedure: Pacemaker Implant;  Surgeon: Evans Lance, MD;  Location: [place] [facility];  Service: Cardiovascular;  Laterality: N/A;   hemrrhoidectomy     LEFT AND RIGHT HEART CATHETERIZATION WITH CORONARY ANGIOGRAM Bilateral 02/01/2011   Procedure: LEFT AND RIGHT HEART CATHETERIZATION WITH CORONARY ANGIOGRAM;  Surgeon: Hillary Bow, MD;  Location: [facility] CATH LAB;  Service: Cardiovascular;  Laterality: Bilateral;   LEFT HEART CATH AND CORONARY ANGIOGRAPHY N/A 10/15/2019   Procedure: LEFT HEART CATH AND CORONARY ANGIOGRAPHY;  Surgeon: Jettie Booze, MD;  Location: [place] [facility];  Service: Cardiovascular;  Laterality: N/A;  LEFT HEART CATH AND CORONARY ANGIOGRAPHY N/A 10/07/2020   Procedure: LEFT HEART CATH AND CORONARY ANGIOGRAPHY;  Surgeon: Sherren Mocha, MD;  Location: [place] CV LAB;  Service: Cardiovascular;  Laterality: N/A;   MAZE  03/15/2011   Procedure: MAZE;  Surgeon: Gaye Pollack, MD;  Location: [place];  Service: Open Heart Surgery;  Laterality: N/A;   PACEMAKER INSERTION  1991   Guidant PPM, most recent  Generator Change by Dr Olevia Perches was 08/22/06   RIGHT/LEFT HEART CATH AND CORONARY ANGIOGRAPHY N/A 07/06/2016   Procedure: Right/Left Heart Cath and Coronary Angiography;  Surgeon: Sherren Mocha, MD;  Location: [place] CV LAB;  Service: Cardiovascular;  Laterality: N/A;   TEE WITHOUT CARDIOVERSION  04/15/2011   Procedure: TRANSESOPHAGEAL ECHOCARDIOGRAM (TEE);  Surgeon: Loralie Champagne, MD;  Location: [place];  Service: Cardiovascular;  Laterality: N/A;   TEE WITHOUT CARDIOVERSION N/A 09/11/2014   Procedure: TRANSESOPHAGEAL ECHOCARDIOGRAM (TEE);  Surgeon: Sueanne Margarita, MD;  Location: [facility] ENDOSCOPY;  Service: Cardiovascular;  Laterality: N/A;    Prior to Admission medications   Medication Sig Start Date End Date Taking? Authorizing Provider  bisacodyl (DULCOLAX) 5 MG EC tablet Take 5 mg by mouth every other day.   Yes [provider]  Cholecalciferol (VITAMIN D3) 2000 units capsule Take 2,000 Units by mouth daily.   Yes [provider]  Coenzyme Q10 200 MG TABS Take 200 mg by mouth daily.   Yes [provider]  cyclobenzaprine (FLEXERIL) 10 MG tablet Take 10 mg by mouth 3 (three) times daily as needed for muscle spasms.   Yes [provider]  diazepam (VALIUM) 2 MG tablet Take 1 tablet (2 mg total) by mouth every 6 (six) hours as needed for muscle spasms. 02/12/21  Yes Kevin Leigh, MD  digoxin (LANOXIN) 0.125 MG tablet Take 1 tablet (0.125 mg total) by mouth daily. 12/31/20  Yes Kevin Dad, MD  doxazosin (CARDURA) 4 MG tablet Take 1 tablet by mouth daily 11/06/20  Yes   ezetimibe (ZETIA) 10 MG tablet Take 1 tablet (10 mg total) by mouth daily. 06/20/20  Yes Evans Lance, MD  HYDROcodone-acetaminophen (NORCO/VICODIN) 5-325 MG tablet TAKE 1 TABLET BY MOUTH 3 TIMES DAILY AS NEEDED FOR MODERATE PAIN 02/04/21 08/03/21 Yes Wert, Christina, NP  LORazepam (ATIVAN) 1 MG tablet TAKE 1 TABLET BY MOUTH AT BEDTIME. MAY ALSO TAKE 1/2 TABLET DAILY AS NEEDED FOR ANXIETY.  11/17/20 05/16/21 Yes Wert, [name], NP  magnesium oxide (MAG-OX) 400 MG tablet Take 400 mg by mouth daily.   Yes [provider]  metoprolol succinate (TOPROL-XL) 50 MG 24 hr tablet Take 1 tablet (50 mg total) by mouth 2 times daily. Take with or immediately following a meal. 12/10/20 12/05/21 Yes Buford Dresser, MD  Multiple Vitamins-Minerals (MULTIVITAMIN WITH MINERALS) tablet Take 1 tablet by mouth daily.   Yes [provider]  OVER THE COUNTER MEDICATION Apply 1 patch topically daily as needed (neck and shoulder pain). Chinese pain patch   Yes [provider]  oxyCODONE-acetaminophen (PERCOCET/ROXICET) 5-325 MG tablet Take 1-2 tablets by mouth every 6 (six) hours as needed for severe pain. 02/12/21  Yes Kevin Leigh, MD  pantoprazole (PROTONIX) 40 MG tablet TAKE 1 TABLET BY MOUTH DAILY. 10/21/20 10/21/21 Yes Sherren Mocha, MD  polyethylene glycol (MIRALAX / GLYCOLAX) 17 g packet Take 17 g by mouth daily as needed for mild constipation.    Yes [provider]  potassium chloride SA (KLOR-CON) 20 MEQ tablet TAKE 1 TABLET BY MOUTH  DAILY. 03/27/20 04/01/21 Yes Sherren Mocha, MD  predniSONE (DELTASONE) 10 MG tablet Take 1 tablet (10 mg total) by mouth daily with breakfast. Patient taking differently: Take 40 mg by mouth daily with breakfast. 5 mg 06/25/20  Yes Kevin Dad, MD  pregabalin (LYRICA) 75 MG capsule Take 75 mg by mouth 2 (two) times daily.   Yes [provider]  rosuvastatin (CRESTOR) 20 MG tablet TAKE 1 TABLET (20 MG TOTAL) BY MOUTH AT BEDTIME. 01/20/21 01/20/22 Yes Sherren Mocha, MD  apixaban (ELIQUIS) 5 MG TABS tablet TAKE 1 TABLET BY MOUTH 2 TIMES DAILY. 09/29/20 09/29/21  Sherren Mocha, MD  butalbital-acetaminophen-caffeine (FIORICET) [id number] MG tablet Take 1 tablet by mouth 2 (two) times daily. 02/19/21   Fargo, Amy E, NP  desoximetasone (TOPICORT) 0.25 % cream Apply 1 application topically 2 (two) times daily. 12/31/20   Kevin Dad, MD  diclofenac Sodium (VOLTAREN) 1 % GEL Apply 2 g topically 2 (two) times daily as needed (knee pain).    [provider]  diltiazem (CARDIZEM) 30 MG tablet Take 30 mg by mouth as needed (heart rate).    [provider]  mometasone (ELOCON) 0.1 % cream Apply 1 application topically daily. 12/31/20   Kevin Dad, MD  nitroGLYCERIN (NITROSTAT) 0.4 MG SL tablet PLACE 1 TABLET UNDER THE TONGUE EVERY 5 MINUTES AS NEEDED FOR CHEST PAIN. 01/01/21 01/01/22  Sherren Mocha, MD  predniSONE (DELTASONE) 50 MG tablet 1 p.o. daily x4 Patient taking differently: Take 50 mg by mouth. 1 p.o. daily x4 02/12/21   Kevin Leigh, MD  tiZANidine (ZANAFLEX) 2 MG tablet Take 1 tablet by mouth 2 times daily. 02/13/21     tiZANidine (ZANAFLEX) 2 MG tablet Take 1 tablet twice a day by oral route. 02/13/21      Allergies  Allergen Reactions   Contrast Media [Iodinated Contrast Media] Hives   Gadolinium Derivatives Hives   Metrizamide Hives    Social History   Tobacco Use   Smoking status: Never   Smokeless tobacco: Never  Substance Use Topics   Alcohol use: No    Alcohol/week: 0.0 standard drinks    Family History  Problem Relation Age of Onset   Heart disease Brother        Twin brother has coronary disease and recent AVR for AS   CAD Brother    Atrial fibrillation Brother    Sarcoidosis Brother    Depression Daughter    Anorexia nervosa Daughter    Hypertension Son    Anesthesia problems Neg Hx    Hypotension Neg Hx    Malignant hyperthermia Neg Hx    Pseudochol deficiency Neg Hx      Review of Systems  Positive ROS: neg  All other systems have been reviewed and were otherwise negative with the exception of those mentioned in the HPI and as above.  Objective: Vital signs in last 24 hours: Temp:  [98 F (36.7 C)] 98 F (36.7 C) (12/26 1136) Pulse Rate:  [67] 67 (12/26 1136) Resp:  [18] 18 (12/26 1136) BP: (170)/(100) 170/100 (12/26 1136) SpO2:  [96 %] 96 %  (12/26 1136) Weight:  [102.5 kg] 102.5 kg (12/26 1136)  General Appearance: Alert, cooperative, no distress, appears stated age Head: Normocephalic, without obvious abnormality, atraumatic Eyes: PERRL, conjunctiva/corneas clear, EOM's intact    Neck: Supple, symmetrical, trachea midline Back: Symmetric, no curvature, ROM normal, no CVA tenderness Lungs:  respirations unlabored Heart: Regular rate and rhythm Abdomen: Soft, non-tender Extremities: Extremities  normal, atraumatic, no cyanosis or edema Pulses: 2+ and symmetric all extremities Skin: Skin color, texture, turgor normal, no rashes or lesions  NEUROLOGIC:   Mental status: Alert and oriented x4,  no aphasia, good attention span, fund of knowledge, and memory Motor Exam - grossly normal Sensory Exam - grossly normal Reflexes: 1+ Coordination - grossly normal Gait - grossly normal Balance - grossly normal Cranial Nerves: I: smell Not tested  II: visual acuity  OS: nl    OD: nl  II: visual fields Full to confrontation  II: pupils Equal, round, reactive to light  III,VII: ptosis None  III,IV,VI: extraocular muscles  Full ROM  V: mastication Normal  V: facial light touch sensation  Normal  V,VII: corneal reflex  Present  VII: facial muscle function - upper  Normal  VII: facial muscle function - lower Normal  VIII: hearing Not tested  IX: soft palate elevation  Normal  IX,X: gag reflex Present  XI: trapezius strength  5/5  XI: sternocleidomastoid strength 5/5  XI: neck flexion strength  5/5  XII: tongue strength  Normal    Data Review Lab Results  Component Value Date   WBC 14.0 (H) 02/23/2021   HGB 12.4 (L) 02/23/2021   HCT 40.8 02/23/2021   MCV 91.5 02/23/2021   PLT 219 02/23/2021   Lab Results  Component Value Date   NA 139 09/30/2020   K 4.3 09/30/2020   CL 98 09/30/2020   CO2 23 09/30/2020   BUN 18 09/30/2020   CREATININE 0.97 09/30/2020   GLUCOSE 103 (H) 09/30/2020   Lab Results  Component Value  Date   INR 1.4 (H) 10/14/2019   PROTIME 18.1 08/16/2008    Assessment/Plan:  Estimated body mass index is 29.82 kg/m as calculated from the following:   Height as of this encounter: 6\' 1"  (1.854 m).   Weight as of this encounter: 102.5 kg. Patient admitted for R L4-5 hemilaminectomy with diskectomy. Patient has failed a reasonable attempt at conservative therapy.  I explained the condition and procedure to the patient and answered any questions.  Patient wishes to proceed with procedure as planned. Understands risks/ benefits and typical outcomes of procedure.   [name] 02/23/2021 2:59 PM

## 2021-02-23 NOTE — Anesthesia Preprocedure Evaluation (Signed)
Anesthesia Evaluation  Patient identified by MRN, date of birth, ID band Patient awake    Reviewed: Allergy & Precautions, NPO status , Patient's Chart, lab work & pertinent test results, reviewed documented beta blocker date and time   Airway Mallampati: IV  TM Distance: >3 FB Neck ROM: Full    Dental  (+) Teeth Intact, Dental Advisory Given   Pulmonary neg pulmonary ROS,    Pulmonary exam normal breath sounds clear to auscultation       Cardiovascular hypertension, Pt. on home beta blockers and Pt. on medications (-) angina+ CAD, + Past MI and + Cardiac Stents  Normal cardiovascular exam+ dysrhythmias Atrial Fibrillation + pacemaker + Valvular Problems/Murmurs (s/p AVR) AS  Rhythm:Regular Rate:Normal     Neuro/Psych PSYCHIATRIC DISORDERS Anxiety HNP  Neuromuscular disease CVA    GI/Hepatic negative GI ROS, Neg liver ROS,   Endo/Other  Hypothyroidism   Renal/GU negative Renal ROS     Musculoskeletal  (+) Arthritis ,   Abdominal   Peds  Hematology  (+) Blood dyscrasia (Eliquis), , MGUS   Anesthesia Other Findings Day of surgery medications reviewed with the patient.  Reproductive/Obstetrics                             Anesthesia Physical Anesthesia Plan  ASA: 3  Anesthesia Plan: General   Post-op Pain Management:    Induction: Intravenous  PONV Risk Score and Plan: 2 and Dexamethasone and Ondansetron  Airway Management Planned: Oral ETT  Additional Equipment:   Intra-op Plan:   Post-operative Plan: Extubation in OR  Informed Consent: I have reviewed the patients History and Physical, chart, labs and discussed the procedure including the risks, benefits and alternatives for the proposed anesthesia with the patient or authorized representative who has indicated his/her understanding and acceptance.     Dental advisory given  Plan Discussed with: CRNA  Anesthesia Plan  Comments:         Anesthesia Quick Evaluation

## 2021-02-23 NOTE — Transfer of Care (Signed)
Immediate Anesthesia Transfer of Care Note  Patient: Kevin Buff, MD  Procedure(s) Performed: Right Lumbar four-five microdiscectomy (Right: Back)  Patient Location: PACU  Anesthesia Type:General  Level of Consciousness: awake, alert , oriented and patient cooperative  Airway & Oxygen Therapy: Patient Spontanous Breathing  Post-op Assessment: Report given to RN and Post -op Vital signs reviewed and stable  Post vital signs: Reviewed and stable  Last Vitals:  Vitals Value Taken Time  BP 176/115 02/23/21 1705  Temp    Pulse 58 02/23/21 1706  Resp 11 02/23/21 1706  SpO2 97 % 02/23/21 1706  Vitals shown include unvalidated device data.  Last Pain:  Vitals:   02/23/21 1334  TempSrc:   PainSc: 4       Patients Stated Pain Goal: 2 (82/80/03 4917)  Complications: No notable events documented.   MDA at bedside.  Orders for BP given.

## 2021-02-23 NOTE — Anesthesia Procedure Notes (Signed)
Procedure Name: Intubation Date/Time: 02/23/2021 3:47 PM Performed by: Oletta Lamas, CRNA Pre-anesthesia Checklist: Patient identified, Emergency Drugs available, Suction available and Patient being monitored Patient Re-evaluated:Patient Re-evaluated prior to induction Oxygen Delivery Method: Circle System Utilized Preoxygenation: Pre-oxygenation with 100% oxygen Induction Type: IV induction Ventilation: Mask ventilation without difficulty Laryngoscope Size: Mac and 4 Grade View: Grade II Tube type: Oral Tube size: 7.5 mm Number of attempts: 1 Airway Equipment and Method: Stylet and Oral airway Placement Confirmation: ETT inserted through vocal cords under direct vision, positive ETCO2 and breath sounds checked- equal and bilateral Secured at: 23 cm Tube secured with: Tape Dental Injury: Teeth and Oropharynx as per pre-operative assessment

## 2021-02-23 NOTE — Anesthesia Postprocedure Evaluation (Signed)
Anesthesia Post Note  Patient: Kevin Buff, MD  Procedure(s) Performed: Right Lumbar four-five microdiscectomy (Right: Back)     Patient location during evaluation: PACU Anesthesia Type: General Level of consciousness: awake and alert Pain management: pain level controlled Vital Signs Assessment: post-procedure vital signs reviewed and stable Respiratory status: spontaneous breathing, nonlabored ventilation, respiratory function stable and patient connected to nasal cannula oxygen Cardiovascular status: blood pressure returned to baseline and stable Postop Assessment: no apparent nausea or vomiting Anesthetic complications: no   No notable events documented.  Last Vitals:  Vitals:   02/23/21 1136 02/23/21 1705  BP: (!) 170/100 (!) 176/115  Pulse: 67 (!) 59  Resp: 18 18  Temp: 36.7 C 36.4 C  SpO2: 96% 93%    Last Pain:  Vitals:   02/23/21 1705  TempSrc:   PainSc: Asleep                 Catalina Gravel

## 2021-02-24 ENCOUNTER — Other Ambulatory Visit (HOSPITAL_BASED_OUTPATIENT_CLINIC_OR_DEPARTMENT_OTHER): Payer: Self-pay

## 2021-02-24 ENCOUNTER — Encounter (HOSPITAL_COMMUNITY): Payer: Self-pay | Admitting: Neurological Surgery

## 2021-03-02 MED FILL — Lorazepam Tab 1 MG: ORAL | 30 days supply | Qty: 45 | Fill #2 | Status: AC

## 2021-03-03 ENCOUNTER — Other Ambulatory Visit (HOSPITAL_BASED_OUTPATIENT_CLINIC_OR_DEPARTMENT_OTHER): Payer: Self-pay

## 2021-03-10 ENCOUNTER — Other Ambulatory Visit (HOSPITAL_BASED_OUTPATIENT_CLINIC_OR_DEPARTMENT_OTHER): Payer: Self-pay

## 2021-03-10 ENCOUNTER — Other Ambulatory Visit: Payer: Self-pay | Admitting: Adult Health

## 2021-03-10 NOTE — Telephone Encounter (Signed)
This is a duplicate request, medication is not on active medication list.   I am unable to deny, I will send to [facility] provider to review

## 2021-03-10 NOTE — Telephone Encounter (Signed)
Patient has request "Hydrocodone". Medication isn't listed in medication list. Prescription pended and routed to Royal Hawthorn, NP for further action. Please Advise.

## 2021-03-11 ENCOUNTER — Other Ambulatory Visit (HOSPITAL_BASED_OUTPATIENT_CLINIC_OR_DEPARTMENT_OTHER): Payer: Self-pay

## 2021-03-11 ENCOUNTER — Other Ambulatory Visit: Payer: Self-pay | Admitting: Adult Health

## 2021-03-11 NOTE — Telephone Encounter (Signed)
Message routed to PCP [name], MD per Royal Hawthorn, NP request.

## 2021-03-11 NOTE — Telephone Encounter (Signed)
There have been several messages about this not in this same thread. Can you please forward this to Dr. Darnell Level for review?She last saw him and wasn't sure we should fill it.

## 2021-03-11 NOTE — Telephone Encounter (Signed)
This is the third time medication has appeared into inbasket refill pool. Medication "Hydrocodone" not on medication list. Patient medication pend and sent to Royal Hawthorn, NP again for further action. Please Advise.

## 2021-03-12 ENCOUNTER — Other Ambulatory Visit (HOSPITAL_BASED_OUTPATIENT_CLINIC_OR_DEPARTMENT_OTHER): Payer: Self-pay

## 2021-03-12 ENCOUNTER — Other Ambulatory Visit: Payer: Self-pay | Admitting: Rheumatology

## 2021-03-12 ENCOUNTER — Encounter (HOSPITAL_BASED_OUTPATIENT_CLINIC_OR_DEPARTMENT_OTHER): Payer: Self-pay | Admitting: Pharmacist

## 2021-03-12 ENCOUNTER — Telehealth: Payer: Self-pay

## 2021-03-12 MED ORDER — HYDROCODONE-ACETAMINOPHEN 5-325 MG PO TABS
1.0000 | ORAL_TABLET | Freq: Three times a day (TID) | ORAL | 0 refills | Status: DC
  Filled 2021-03-12: qty 90, 30d supply, fill #0

## 2021-03-12 NOTE — Telephone Encounter (Signed)
I tried to call him but he didn't pick up. I am Approving his Vicodin but if he calls let him know that he cannot have different providers approve different Narcotics for him as Percocet few weeks ago It is causing confusion and making it difficult for [place] to prescribe him his Meds. I am doing it in [facility]

## 2021-03-12 NOTE — Telephone Encounter (Signed)
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  Patient stressed that he recently had surgery and needs this medication in hand by 5 pm today.

## 2021-03-12 NOTE — Telephone Encounter (Signed)
Patient called a third time and stated this is ridiculous that his message has not been addressed and Dr.Gupta needs to call him ASAP on his cell phone. 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  Patients wife Baker Janus, was in the background expressing her frustration with this process and stated "He is in pain and just had surgery."   Patient was instructed that we have sent a message each time he calls and I will send a third message.

## 2021-03-12 NOTE — Telephone Encounter (Signed)
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 Ronnald Ramp ) neurosurgery 2 weeks ago and seen 3 days but told Wert/Gupta will send refill to [facility] (open Sat-Sun) for Viocdin 5 tid #90. Medication currently not on med list.  To Dr. Lyndel Safe

## 2021-03-16 ENCOUNTER — Ambulatory Visit (INDEPENDENT_AMBULATORY_CARE_PROVIDER_SITE_OTHER): Payer: Medicare Other | Admitting: Cardiology

## 2021-03-16 ENCOUNTER — Other Ambulatory Visit: Payer: Self-pay

## 2021-03-16 ENCOUNTER — Encounter (HOSPITAL_BASED_OUTPATIENT_CLINIC_OR_DEPARTMENT_OTHER): Payer: Self-pay | Admitting: Cardiology

## 2021-03-16 VITALS — BP 152/80 | HR 62 | Ht 73.0 in | Wt 226.0 lb

## 2021-03-16 DIAGNOSIS — Z952 Presence of prosthetic heart valve: Secondary | ICD-10-CM

## 2021-03-16 DIAGNOSIS — I5022 Chronic systolic (congestive) heart failure: Secondary | ICD-10-CM

## 2021-03-16 DIAGNOSIS — I25118 Atherosclerotic heart disease of native coronary artery with other forms of angina pectoris: Secondary | ICD-10-CM

## 2021-03-16 DIAGNOSIS — I4719 Other supraventricular tachycardia: Secondary | ICD-10-CM

## 2021-03-16 DIAGNOSIS — I25119 Atherosclerotic heart disease of native coronary artery with unspecified angina pectoris: Secondary | ICD-10-CM

## 2021-03-16 DIAGNOSIS — R5382 Chronic fatigue, unspecified: Secondary | ICD-10-CM | POA: Diagnosis not present

## 2021-03-16 DIAGNOSIS — D6869 Other thrombophilia: Secondary | ICD-10-CM | POA: Diagnosis not present

## 2021-03-16 DIAGNOSIS — I471 Supraventricular tachycardia: Secondary | ICD-10-CM

## 2021-03-16 NOTE — Patient Instructions (Signed)

## 2021-03-16 NOTE — Therapy (Deleted)
OUTPATIENT PHYSICAL THERAPY THORACOLUMBAR EVALUATION   Patient Name: Kevin CERRONE, MD MRN: [id number] DOB:August 16, 1934, 87 y.o., male Today's Date: 03/16/2021    Past Medical History:  Diagnosis Date   Aortic stenosis    moderate aortic stenosis   Arthritis    Benign prostatic hypertrophy    Chronic systolic heart failure (HCC)    Chronotropic incompetence with sinus node dysfunction (HCC)    Status post Guidant dual-mode, dual-pacing, dual-sensing  pacemaker   implantation now programmed to AAI with recent generator change.   Coronary artery disease    status post multiple prior percutaneous coronary interventions, microvascular angina per Dr Olevia Perches   Diverticulitis sigmoid colon recurrent    Dysfunctional autonomic nervous system    Dyspnea    Heart murmur    History of primary hypertension    Hypercoagulable state ([place])    chronically anticoagulated with coumadin   Hyperlipidemia    Hyperthyroidism    Hypothyroidism    Dr. 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 Lia Foyer,    Prediabetes 09/21/2017   Stroke ([place])    1990   Past Surgical History:  Procedure Laterality Date   AORTIC VALVE REPLACEMENT  03/15/2011   Procedure: AORTIC VALVE REPLACEMENT (AVR);  Surgeon: Gaye Pollack, MD;  Location: [place];  Service: Open Heart Surgery;  Laterality: N/A;   APPENDECTOMY     CARDIAC CATHETERIZATION     11   CARDIOVERSION     CARDIOVERSION  04/15/2011   Procedure: CARDIOVERSION;  Surgeon: Loralie Champagne, MD;  Location: [place];  Service: Cardiovascular;  Laterality: N/A;   CARDIOVERSION N/A 09/11/2014   Procedure: CARDIOVERSION;  Surgeon: Sueanne Margarita, MD;  Location: [facility];  Service: Cardiovascular;  Laterality: N/A;   CARDIOVERSION N/A 06/27/2015   Procedure: CARDIOVERSION;  Surgeon: Thayer Headings, MD;  Location: [place];   Service: Cardiovascular;  Laterality: N/A;   CARDIOVERSION N/A 07/04/2015   Procedure: CARDIOVERSION;  Surgeon: Evans Lance, MD;  Location: [place];  Service: Cardiovascular;  Laterality: N/A;   CARDIOVERSION N/A 04/13/2017   Procedure: CARDIOVERSION;  Surgeon: Sueanne Margarita, MD;  Location: [facility] ENDOSCOPY;  Service: Cardiovascular;  Laterality: N/A;   COLONOSCOPY     CORONARY STENT INTERVENTION N/A 10/15/2019   Procedure: CORONARY STENT INTERVENTION;  Surgeon: Jettie Booze, MD;  Location: [place] CV LAB;  Service: Cardiovascular;  Laterality: N/A;   EP IMPLANTABLE DEVICE N/A 06/16/2015   Procedure: Pacemaker Implant;  Surgeon: Evans Lance, MD;  Location: [place] [facility];  Service: Cardiovascular;  Laterality: N/A;   hemrrhoidectomy     LEFT AND RIGHT HEART CATHETERIZATION WITH CORONARY ANGIOGRAM Bilateral 02/01/2011   Procedure: LEFT AND RIGHT HEART CATHETERIZATION WITH CORONARY ANGIOGRAM;  Surgeon: Hillary Bow, MD;  Location: [facility] CATH LAB;  Service: Cardiovascular;  Laterality: Bilateral;   LEFT HEART CATH AND CORONARY ANGIOGRAPHY N/A 10/15/2019   Procedure: LEFT HEART CATH AND CORONARY ANGIOGRAPHY;  Surgeon: Jettie Booze, MD;  Location: [place] CV LAB;  Service: Cardiovascular;  Laterality: N/A;   LEFT HEART CATH AND CORONARY ANGIOGRAPHY N/A 10/07/2020   Procedure: LEFT HEART CATH AND CORONARY ANGIOGRAPHY;  Surgeon: Sherren Mocha, MD;  Location: [place] CV LAB;  Service: Cardiovascular;  Laterality: N/A;   LUMBAR LAMINECTOMY/DECOMPRESSION MICRODISCECTOMY Right 02/23/2021   Procedure: Right Lumbar four-five microdiscectomy;  Surgeon: Eustace Moore, MD;  Location: [place] [facility];  Service: Neurosurgery;  Laterality: Right;   MAZE  03/15/2011   Procedure: MAZE;  Surgeon: Gaye Pollack, MD;  Location: [place];  Service: Open Heart Surgery;  Laterality: N/A;   PACEMAKER INSERTION  1991   Guidant PPM, most recent Generator Change by Dr Olevia Perches was 08/22/06   RIGHT/LEFT  HEART CATH AND CORONARY ANGIOGRAPHY N/A 07/06/2016   Procedure: Right/Left Heart Cath and Coronary Angiography;  Surgeon: Sherren Mocha, MD;  Location: [place] CV LAB;  Service: Cardiovascular;  Laterality: N/A;   TEE WITHOUT CARDIOVERSION  04/15/2011   Procedure: TRANSESOPHAGEAL ECHOCARDIOGRAM (TEE);  Surgeon: Loralie Champagne, MD;  Location: [place];  Service: Cardiovascular;  Laterality: N/A;   TEE WITHOUT CARDIOVERSION N/A 09/11/2014   Procedure: TRANSESOPHAGEAL ECHOCARDIOGRAM (TEE);  Surgeon: Sueanne Margarita, MD;  Location: [facility] ENDOSCOPY;  Service: Cardiovascular;  Laterality: N/A;   Patient Active Problem List   Diagnosis Date Noted   Pure hypercholesterolemia 09/08/2020   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[place]) 95/10/3265   Chronic systolic heart failure ([place]) 02/27/2020   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) 04/11/2017   Vitamin D deficiency 05/20/2016   History of pacemaker 05/20/2016   History of CVA (cerebrovascular accident) 05/20/2016   Primary osteoarthritis of both hands 03/10/2016   DDD cervical spine 03/10/2016   Osteoarthritis of lumbar spine 03/10/2016   Chronic left shoulder pain 03/10/2016   Trochanteric bursitis of right hip 03/10/2016   Other fatigue 03/10/2016   Claudication in peripheral vascular disease ([place]) 03/10/2016   Chronic pain syndrome 03/10/2016   Ocular myasthenia gravis ([place]) 10/28/2015   Typical atrial flutter (HCC)     PVC's (premature ventricular contractions) 01/30/2015   Pacemaker 04/30/2013   IgM monoclonal gammopathy of uncertain significance 02/17/2013   Orthostatic hypotension 04/11/2011   Long term current use of anticoagulant therapy 03/24/2011   S/P AVR (aortic valve replacement) 03/24/2011   Pleural effusion 03/24/2011   Hypothyroidism 11/22/2010   Atrial flutter ([place]) 08/19/2010   Syndrome X (cardiac) ([place]) 04/06/2010   Mixed hyperlipidemia 12/08/2007   PRIMARY HYPERCOAGULABLE STATE 12/08/2007   Coronary artery disease with exertional angina ([place]) 12/08/2007   Coronary artery disease of native artery of native heart with stable angina pectoris ([place]) 12/08/2007   AORTIC STENOSIS/ INSUFFICIENCY, NON-RHEUMATIC 12/08/2007   Atrial fibrillation ([place]) 12/08/2007   Chronotropic incompetence with sinus node dysfunction ([place]) 12/08/2007    PCP: [name], MD  REFERRING PROVIDER: Eustace Moore, MD  REFERRING DIAG: M54.16 radiculopathy, lumbar region  THERAPY DIAG:  No diagnosis found.  ONSET DATE: *** DOS 02/23/21  SUBJECTIVE:  SUBJECTIVE STATEMENT: *** PERTINENT HISTORY:  ***  PAIN:  Are you having pain? {yes/no:20286} NPRS scale: ***/10 Pain location: *** Pain orientation: {Pain Orientation:25161}  PAIN TYPE: {type:313116} Pain description: {PAIN DESCRIPTION:21022940}  Aggravating factors: *** Relieving factors: ***  PRECAUTIONS: Back  WEIGHT BEARING RESTRICTIONS No  FALLS:  Has patient fallen in last 6 months? {yes/no:20286}, Number of falls: ***  LIVING ENVIRONMENT: Lives with: {[facility] lives with:25569::"lives with their family"} Lives in: {Lives in:25570} Stairs: {yes/no:20286}; {Stairs:24000} Has following equipment at home: {Assistive devices:23999}  OCCUPATION:  ***  PLOF: {PLOF:24004}  PATIENT GOALS ***   OBJECTIVE:   DIAGNOSTIC FINDINGS:  ***  PATIENT SURVEYS:  {rehab surveys:24030}  SCREENING FOR RED FLAGS: Bowel or bladder incontinence: {Yes/No:304960894} Spinal tumors: {Yes/No:304960894} Cauda equina syndrome: {Yes/No:304960894} Compression fracture: {Yes/No:304960894} Abdominal aneurysm: {Yes/No:304960894}  COGNITION:  Overall cognitive status: {cognition:24006}     SENSATION:  Light touch: {intact/deficits:24005}  Stereognosis: {intact/deficits:24005}  Hot/Cold: {intact/deficits:24005}  Proprioception: {intact/deficits:24005}  MUSCLE LENGTH: Hamstrings: Right *** deg; Left *** deg Thomas test: Right *** deg; Left *** deg  POSTURE:  ***  PALPATION: ***  LUMBARAROM/PROM  A/PROM A/PROM  03/16/2021  Flexion   Extension   Right lateral flexion   Left lateral flexion   Right rotation   Left rotation    (Blank rows = not tested) LE Measurements Lower Extremity Right 03/16/2021 Left 03/16/2021   A/PROM MMT A/PROM MMT  Hip Flexion      Hip Extension      Hip Abduction      Hip Adduction      Hip Internal rotation      Hip External rotation      Knee Flexion      Knee Extension      Ankle Dorsiflexion      Ankle Plantarflexion      Ankle Inversion      Ankle Eversion       (Blank rows = not tested)   LUMBAR SPECIAL TESTS:  {lumbar special test:25242}  FUNCTIONAL TESTS:  {Functional tests:24029}  GAIT: Distance walked: *** Assistive device utilized: {Assistive devices:23999} Level of assistance: {Levels of assistance:24026} Comments: ***    TODAY'S TREATMENT  ***03/17/21 Therapeutic Exercise:  Aerobic: Supine: Prone:  Seated:  Standing: Neuromuscular Re-education: Manual Therapy: Therapeutic Activity: Self Care: Trigger Point Dry Needling:  Modalities:     PATIENT EDUCATION:  Education details: on current presentation, on HEP, on clinical outcomes score and POC Person educated:  Patient Education method: Explanation, Demonstration, and Handouts Education comprehension: verbalized understanding    HOME EXERCISE PROGRAM: ***  ASSESSMENT:  CLINICAL IMPRESSION: Patient is a 87 y.o. male who was seen today for physical therapy evaluation and treatment for ***. Objective impairments include {opptimpairments:25111}. These impairments are limiting patient from {activity limitations:25113}. Personal factors including {Personal factors:25162} are also affecting patient's functional outcome. Patient will benefit from skilled PT to address above impairments and improve overall function.  REHAB POTENTIAL: {rehabpotential:25112}  CLINICAL DECISION MAKING: {clinical decision making:25114}  EVALUATION COMPLEXITY: {Evaluation complexity:25115}   GOALS: Goals reviewed with patient? Yes SHORT TERM GOALS:  STG Name Target Date Goal status  1 Patient will be independent in self management strategies to improve quality of life and functional outcomes. Baseline:  *** INITIAL  2 Patient will report at least 50% improvement in overall symptoms and/or function to demonstrate improved functional mobility Baseline: 0%  INITIAL  3 *** Baseline:  INITIAL  4  Baseline:    5  Baseline:    6  Baseline:  7  Baseline:     LONG TERM GOALS:   LTG Name Target Date Goal status  1 Patient will report at least 75% improvement in overall symptoms and/or function to demonstrate improved functional mobility Baseline:0% *** INITIAL  2 *** Baseline:  INITIAL  3 *** Baseline:  INITIAL  4  Baseline:    5  Baseline:    6  Baseline:    7  Baseline:       PLAN: PT FREQUENCY: {rehab frequency:25116}  PT DURATION: {rehab duration:25117}  PLANNED INTERVENTIONS: {rehab planned interventions:25118::"Therapeutic exercises","Therapeutic activity","Neuro Muscular re-education","Balance training","Gait training","Patient/Family education","Joint mobilization"}  PLAN FOR NEXT  SESSION: ***   12:57 PM, 03/16/21 Jerene Pitch, DPT Physical Therapy with [facility]  [phone] office

## 2021-03-16 NOTE — Progress Notes (Signed)
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 He was initially seen as a new patient to me 05/16/20.  Today: On 02/23/2021 he underwent right L4-L5 microdiscectomy. He is recovering well at this time. No issues with arrhythmia perioperatively. Held DOAC perioperatively but has now restarted.  Overall, he continues to feel generalized weakness that he mainly attributes to atrial flutter. At night his heart rate typically runs in the 60s, and is often around 85 bpm during the day. Most of the time his heart rate is under 100 bpm.  He has occasional lightheadedness/dizziness, especially after eating. No falls.   He denies any palpitations, chest pain, or shortness of breath. No headaches, syncope, orthopnea, or PND.   Past Medical History:  Diagnosis Date   Aortic stenosis    moderate aortic stenosis   Arthritis    Benign prostatic hypertrophy    Chronic systolic heart failure (HCC)    Chronotropic incompetence with sinus node dysfunction (HCC)    Status post Guidant dual-mode, dual-pacing, dual-sensing  pacemaker   implantation now programmed to AAI with recent generator change.   Coronary artery disease    status post multiple prior percutaneous coronary interventions, microvascular angina per Dr Olevia Perches   Diverticulitis sigmoid colon recurrent    Dysfunctional autonomic nervous system    Dyspnea    Heart murmur    History of primary hypertension    Hypercoagulable state ([place])     chronically anticoagulated with coumadin   Hyperlipidemia    Hyperthyroidism    Hypothyroidism    Dr. 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 Lia Foyer,    Prediabetes 09/21/2017   Stroke ([place])    1990    Past Surgical History:  Procedure Laterality Date   AORTIC VALVE REPLACEMENT  03/15/2011   Procedure: AORTIC VALVE REPLACEMENT (AVR);  Surgeon: Gaye Pollack, MD;  Location: [facility];  Service: Open Heart Surgery;  Laterality: N/A;   APPENDECTOMY     CARDIAC CATHETERIZATION     11   CARDIOVERSION     CARDIOVERSION  04/15/2011   Procedure: CARDIOVERSION;  Surgeon: Loralie Champagne, MD;  Location: [facility];  Service: Cardiovascular;  Laterality: N/A;   CARDIOVERSION N/A 09/11/2014   Procedure: CARDIOVERSION;  Surgeon: Sueanne Margarita, MD;  Location: [facility];  Service: Cardiovascular;  Laterality: N/A;   CARDIOVERSION N/A 06/27/2015   Procedure: CARDIOVERSION;  Surgeon: Thayer Headings, MD;  Location: [facility];  Service: Cardiovascular;  Laterality: N/A;   CARDIOVERSION N/A 07/04/2015   Procedure: CARDIOVERSION;  Surgeon: Evans Lance, MD;  Location: [facility];  Service: Cardiovascular;  Laterality: N/A;   CARDIOVERSION N/A 04/13/2017   Procedure: CARDIOVERSION;  Surgeon: Sueanne Margarita, MD;  Location: [facility] ENDOSCOPY;  Service: Cardiovascular;  Laterality: N/A;   COLONOSCOPY     CORONARY STENT INTERVENTION N/A 10/15/2019   Procedure:  CORONARY STENT INTERVENTION;  Surgeon: Jettie Booze, MD;  Location: [place] CV LAB;  Service: Cardiovascular;  Laterality: N/A;   EP IMPLANTABLE DEVICE N/A 06/16/2015   Procedure: Pacemaker Implant;  Surgeon: Evans Lance, MD;  Location: [place] CV LAB;  Service: Cardiovascular;  Laterality: N/A;   hemrrhoidectomy     LEFT AND RIGHT HEART CATHETERIZATION WITH CORONARY ANGIOGRAM Bilateral 02/01/2011    Procedure: LEFT AND RIGHT HEART CATHETERIZATION WITH CORONARY ANGIOGRAM;  Surgeon: Hillary Bow, MD;  Location: [facility] CATH LAB;  Service: Cardiovascular;  Laterality: Bilateral;   LEFT HEART CATH AND CORONARY ANGIOGRAPHY N/A 10/15/2019   Procedure: LEFT HEART CATH AND CORONARY ANGIOGRAPHY;  Surgeon: Jettie Booze, MD;  Location: [place] CV LAB;  Service: Cardiovascular;  Laterality: N/A;   LEFT HEART CATH AND CORONARY ANGIOGRAPHY N/A 10/07/2020   Procedure: LEFT HEART CATH AND CORONARY ANGIOGRAPHY;  Surgeon: Sherren Mocha, MD;  Location: [place] CV LAB;  Service: Cardiovascular;  Laterality: N/A;   LUMBAR LAMINECTOMY/DECOMPRESSION MICRODISCECTOMY Right 02/23/2021   Procedure: Right Lumbar four-five microdiscectomy;  Surgeon: Eustace Moore, MD;  Location: [place];  Service: Neurosurgery;  Laterality: Right;   MAZE  03/15/2011   Procedure: MAZE;  Surgeon: Gaye Pollack, MD;  Location: [place];  Service: Open Heart Surgery;  Laterality: N/A;   PACEMAKER INSERTION  1991   Guidant PPM, most recent Generator Change by Dr Olevia Perches was 08/22/06   RIGHT/LEFT HEART CATH AND CORONARY ANGIOGRAPHY N/A 07/06/2016   Procedure: Right/Left Heart Cath and Coronary Angiography;  Surgeon: Sherren Mocha, MD;  Location: [place] CV LAB;  Service: Cardiovascular;  Laterality: N/A;   TEE WITHOUT CARDIOVERSION  04/15/2011   Procedure: TRANSESOPHAGEAL ECHOCARDIOGRAM (TEE);  Surgeon: Loralie Champagne, MD;  Location: [place];  Service: Cardiovascular;  Laterality: N/A;   TEE WITHOUT CARDIOVERSION N/A 09/11/2014   Procedure: TRANSESOPHAGEAL ECHOCARDIOGRAM (TEE);  Surgeon: Sueanne Margarita, MD;  Location: [facility] ENDOSCOPY;  Service: Cardiovascular;  Laterality: N/A;    Current Medications: Current Outpatient Medications on File Prior to Visit  Medication Sig   apixaban (ELIQUIS) 5 MG TABS tablet TAKE 1 TABLET BY MOUTH 2 TIMES DAILY.   bisacodyl (DULCOLAX) 5 MG EC tablet Take 5 mg by mouth every other day.    Cholecalciferol (VITAMIN D3) 2000 units capsule Take 2,000 Units by mouth daily.   Coenzyme Q10 200 MG TABS Take 200 mg by mouth daily.   cyclobenzaprine (FLEXERIL) 10 MG tablet Take 10 mg by mouth 3 (three) times daily as needed for muscle spasms.   desoximetasone (TOPICORT) 0.25 % cream Apply 1 application topically 2 (two) times daily.   diclofenac Sodium (VOLTAREN) 1 % GEL Apply 2 g topically 2 (two) times daily as needed (knee pain).   digoxin (LANOXIN) 0.125 MG tablet Take 1 tablet (0.125 mg total) by mouth daily.   diltiazem (CARDIZEM) 30 MG tablet Take 30 mg by mouth as needed (heart rate).   doxazosin (CARDURA) 4 MG tablet Take 1 tablet by mouth daily   ezetimibe (ZETIA) 10 MG tablet Take 1 tablet (10 mg total) by mouth daily.   HYDROcodone-acetaminophen (NORCO) 5-325 MG tablet Take 1 tablet by mouth 3 (three) times daily.   LORazepam (ATIVAN) 1 MG tablet TAKE 1 TABLET BY MOUTH AT BEDTIME. MAY ALSO TAKE 1/2 TABLET DAILY AS NEEDED FOR ANXIETY.   magnesium oxide (MAG-OX) 400 MG tablet Take 400 mg by mouth daily.   metoprolol succinate (TOPROL-XL) 50 MG 24 hr tablet Take 1 tablet (50  mg total) by mouth 2 times daily. Take with or immediately following a meal.   mometasone (ELOCON) 0.1 % cream Apply 1 application topically daily.   Multiple Vitamins-Minerals (MULTIVITAMIN WITH MINERALS) tablet Take 1 tablet by mouth daily.   nitroGLYCERIN (NITROSTAT) 0.4 MG SL tablet PLACE 1 TABLET UNDER THE TONGUE EVERY 5 MINUTES AS NEEDED FOR CHEST PAIN.   OVER THE COUNTER MEDICATION Apply 1 patch topically daily as needed (neck and shoulder pain). Chinese pain patch   oxyCODONE-acetaminophen (PERCOCET/ROXICET) 5-325 MG tablet Take 1 tablet by mouth every 4 (four) hours as needed for severe pain.   pantoprazole (PROTONIX) 40 MG tablet TAKE 1 TABLET BY MOUTH DAILY.   polyethylene glycol (MIRALAX / GLYCOLAX) 17 g packet Take 17 g by mouth daily as needed for mild constipation.    potassium chloride SA  (KLOR-CON) 20 MEQ tablet TAKE 1 TABLET BY MOUTH DAILY.   predniSONE (DELTASONE) 10 MG tablet Take 1 tablet (10 mg total) by mouth daily with breakfast. (Patient taking differently: Take 40 mg by mouth daily with breakfast. 5 mg)   predniSONE (DELTASONE) 50 MG tablet 1 p.o. daily x4 (Patient taking differently: Take 50 mg by mouth. 1 p.o. daily x4)   pregabalin (LYRICA) 75 MG capsule Take 75 mg by mouth 2 (two) times daily.   rosuvastatin (CRESTOR) 20 MG tablet TAKE 1 TABLET (20 MG TOTAL) BY MOUTH AT BEDTIME.   tiZANidine (ZANAFLEX) 2 MG tablet Take 1 tablet by mouth 2 times daily.   No current facility-administered medications on file prior to visit.     Allergies:   Contrast media [iodinated contrast media], Gadolinium derivatives, and Metrizamide   Social History   Tobacco Use   Smoking status: Never   Smokeless tobacco: Never  Vaping Use   Vaping Use: Never used  Substance Use Topics   Alcohol use: No    Alcohol/week: 0.0 standard drinks   Drug use: No    Family History: family history includes Anorexia nervosa in his daughter; Atrial fibrillation in his brother; CAD in his brother; Depression in his daughter; Heart disease in his brother; Hypertension in his son; Sarcoidosis in his brother. There is no history of Anesthesia problems, Hypotension, Malignant hyperthermia, or Pseudochol deficiency.  ROS:   Please see the history of present illness. (+) Generalized weakness (+) Dizziness All other systems are reviewed and negative.    EKGs/Labs/Other Studies Reviewed:    The following studies were reviewed today:  Monitor 11/11/2020: 1. Atrial flutter with a CVR 2. Rare PVC's and NSVT 3. No prolonged pauses 4. Brief episodes of RVR with an ave HR of 63/min and briefly up to 130/min.  Left Heart Cath 10/07/2020:   Prox RCA lesion is 30% stenosed.   Mid RCA lesion is 40% stenosed.   Prox LAD to Mid LAD lesion is 40% stenosed.   Previously placed Mid Cx to Dist Cx stent  (unknown type) is  widely patent.   Non-stenotic Dist RCA lesion was previously treated.   Patent left main with no stenosis Patent LAD with mild-moderate proximal stenosis unchanged from previous cardiac cath studies Patent LCx stent with no significant stenosis Patent RCA with mild diffuse plaquing and patent stent in the distal RCA Normal LV filling pressure and no transaortic gradient across bioprosthetic aortic valve   Recommend: medical therapy.    Note the patient has frequent runs of PAT throughout the procedure with HR above 160 bpm and drop in blood pressure down to the 70's during these  runs  Echo 01-18-2020:   1. Left ventricular ejection fraction, by estimation, is 45 to 50%. The  left ventricle has mildly decreased function. The left ventricle  demonstrates regional wall motion abnormalities (see scoring  diagram/findings for description). There is mild left  ventricular hypertrophy. Left ventricular diastolic function could not be  evaluated. There is severe akinesis of the left ventricular, basal-mid  inferior wall suggestive of scar.   2. Left atrial size was moderately dilated.   3. The mitral valve is abnormal. Mild mitral valve regurgitation.   4. The aortic valve has been repaired/replaced with a bioprosthetic valve  (2013). Aortic valve area, by VTI measures 1.27 cm. Aortic valve mean  gradient measures 9.0 mmHg. Aortic valve Vmax measures 2.05 m/s. DI of  0.33.   5. Aortic dilatation noted. There is mild dilatation of the ascending  aorta, measuring 41 mm.   6. Right atrial size was mildly dilated.   7. Right ventricular systolic function is low normal. The right  ventricular size is normal. There is normal pulmonary artery systolic  pressure.   8. The inferior vena cava is dilated in size with >50% respiratory  variability, suggesting right atrial pressure of 8 mmHg.   Comparison(s): No significant change from prior study. 10/15/2019: LVEF  45-50%, basal  to mid inferior hypokinesis, bioprosthetic AVR - mean  gradient 7 mmHg.    Cardiac Cath 10-15-2019: Conclusion     Prox LAD to Mid LAD lesion is 40% stenosed. Prox RCA lesion is 30% stenosed. Mid RCA lesion is 40% stenosed. Previously placed Mid Cx to Dist Cx stent (unknown type) is widely patent. Dist RCA lesion is 75% stenosed. This was new compared to prior cath in 2018. A drug-eluting stent was successfully placed using a SYNERGY XD 2.75X16. THis was dilated to 3.0 mm and postdilated to 3.4 mm. Post intervention, there is a 0% residual stenosis. There is moderate left ventricular systolic dysfunction. LV end diastolic pressure is normal. The left ventricular ejection fraction is 35-45% by visual estimate. Global hypokinesis.   No aspirin given Eliquis and Plavix.  Could use Plavix longer if no bleeding issues, but bare minimum would be one month.     Continue aggressive secondary prevention and management of his tachycardia.    Diagnostic Dominance: Right   Left Main  Vessel is angiographically normal.  Left Anterior Descending  Prox LAD to Mid LAD lesion is 40% stenosed. There is mild diffuse proximal LAD stenosis unchanged from previous study. The LAD wraps around the LV apex  Left Circumflex  Previously placed Mid Cx to Dist Cx stent (unknown type) is widely patent.  Right Coronary Artery  There is mild diffuse disease throughout the vessel. Large, dominant vessel. There are diffuse irregularities with mild stenoses in the proximal and mid vessel. The distal vessel is widely patent. The PDA and PLA branches are patent.  Prox RCA lesion is 30% stenosed. The lesion is calcified.  Mid RCA lesion is 40% stenosed. The lesion is calcified.  Dist RCA lesion is 75% stenosed.    Intervention     Dist RCA lesion  Stent  CATH LAUNCHER 6FR JR4 guide catheter was inserted. Lesion crossed with guidewire using a WIRE ASAHI PROWATER 180CM. Pre-stent angioplasty was performed using a  BALLOON SAPPHIRE 2.5X12. A drug-eluting stent was successfully placed using a SYNERGY XD 2.75X16. Stent strut is well apposed. Post-stent angioplasty was performed using a BALLOON SAPPHIRE [place] 3.25X12.  Post-Intervention Lesion Assessment  The intervention was successful. Pre-interventional  TIMI flow is 3. Post-intervention TIMI flow is 3. No complications occurred at this lesion.  There is a 0% residual stenosis post intervention.   EKG:  EKG is personally reviewed.    03/16/2021: not ordered 12/10/2020: atrial flutter, V paced at 60 bpm 09/15/2020: atrial tachycardia/atypical atrial flutter at 74 bpm, RBBB, LAFB 05/16/2020: atrial tachycardia with a ventricular rate of 70 bpm  Recent Labs: 08/26/2020: ALT 19 02/23/2021: BUN 18; Creatinine, Ser 0.93; Hemoglobin 12.4; Platelets 219; Potassium 4.4; Sodium 138   Recent Lipid Panel    Component Value Date/Time   CHOL 137 08/26/2020 1234   CHOL 158 09/02/2017 1006   TRIG 66 08/26/2020 1234   HDL 75 08/26/2020 1234   HDL 72 09/02/2017 1006   CHOLHDL 1.8 08/26/2020 1234   VLDL 13 08/26/2020 1234   LDLCALC 49 08/26/2020 1234   LDLCALC 68 09/02/2017 1006   LDLDIRECT 117.9 11/16/2010 1355    Physical Exam:    VS:  BP (!) 152/80 (BP Location: Right Arm, Patient Position: Sitting, Cuff Size: Large)    Pulse 62    Ht 6\' 1"  (1.854 m)    Wt 226 lb (102.5 kg)    SpO2 96%    BMI 29.82 kg/m     Wt Readings from Last 3 Encounters:  03/16/21 226 lb (102.5 kg)  02/23/21 226 lb (102.5 kg)  02/12/21 226 lb (102.5 kg)    GEN: Well nourished, well developed in no acute distress HEENT: Normal, moist mucous membranes NECK: No JVD CARDIAC:  largely regular rhythm, normal S1 and S2, no rubs or gallops. 1/6 systolic murmur. VASCULAR: Radial and DP pulses 2+ bilaterally. No carotid bruits RESPIRATORY:  Clear to auscultation without rales, wheezing or rhonchi  ABDOMEN: Soft, non-tender, non-distended MUSCULOSKELETAL:  Ambulates independently SKIN: Warm  and dry, no edema NEUROLOGIC:  Alert and oriented x 3. No focal neuro deficits noted. PSYCHIATRIC:  Normal affect    ASSESSMENT:    1. Atrial tachycardia ([place])   2. Chronic fatigue   3. Chronic systolic heart failure ([place])   4. Coronary artery disease of native artery of native heart with stable angina pectoris ([place])   5. S/P AVR (aortic valve replacement)   6. Secondary hypercoagulable state ([place])   7. Coronary artery disease involving native coronary artery of native heart with angina pectoris (HCC)     PLAN:    Fatigue, chronic -unclear etiology. Symptoms did not improve after PCI in 2021. No significant progression of disease on recent cath -continue metoprolol succinate dose 50 mg BID. Prior was on 100 mg BID  Chronic systolic and diastolic heart failure Low normal EF (45-50% with akinetic basal-mid inferior wall 12/2019), suspect ischemic cardiomyopathy (given focal WMA) vs arrythmia mediated -similar to echo from 10/15/19, but echo 02/13/2019 read as 55-60% -metoprolol as above -History of orthostatic hypotension, need to be cautious about antihypertensive agents  Atrial tachycardia: history of atrial fib, atrial flutter, and atrial tachycardia with secondary hypercoagulable state S/P Dual Chamber PPM -has had extensive evaluation at [facility], [facility], [facility], [facility] etc without complete resolution -follows closely with Dr. Lovena Le. Has discussed with Dr. Quentin Ore as well.  -now on digoxin as well with more stable rates  History of CVA: -continue apixaban, rosuvastatin  Bioprosthetic AVR: -no longer on aspirin, is on apixaban alone -echo reviewed as above  CAD with stable angina: -has not tolerated imdur for angina -on metoprolol succinate -reviewed cath from 09/2019. Had PCI to RCA, did not improve fatigue -did not have improvement  on ranolazine in the past -most recent cath without significant change  Hypercholesterolemia: -continue rosuvastatin, ezetimibe -Last LDL 45, goal  <70.   Cardiac risk counseling and prevention recommendations: -recommend heart healthy/Mediterranean diet, with whole grains, fruits, vegetable, fish, lean meats, nuts, and olive oil. Limit salt. -recommend moderate walking, 3-5 times/week for 30-50 minutes each session. Aim for at least 150 minutes.week. Goal should be pace of 3 miles/hours, or walking 1.5 miles in 30 minutes -recommend avoidance of tobacco products. Avoid excess alcohol.  Plan for follow up: 6 months, will contact me if symptoms worsen  Total time of encounter: 50 minutes total time of encounter, including 43 minutes spent in face-to-face patient care. This time includes coordination of care and counseling regarding recent surgery, management review. Remainder of non-face-to-face time involved reviewing chart documents/testing relevant to the patient encounter and documentation in the medical record.  Buford Dresser, MD, PhD, [facility]    Medication Adjustments/Labs and Tests Ordered: Current medicines are reviewed at length with the patient today.  Concerns regarding medicines are outlined above.   No orders of the defined types were placed in this encounter.  No orders of the defined types were placed in this encounter.  Patient Instructions  Medication Instructions:  Your Physician recommend you continue on your current medication as directed.    *If you need a refill on your cardiac medications before your next appointment, please call your pharmacy*   Lab Work: None ordered today   Testing/Procedures: None ordered today   Follow-Up: At [facility], you and your health needs are our priority.  As part of our continuing mission to provide you with exceptional heart care, we have created designated Provider Care Teams.  These Care Teams include your primary Cardiologist (physician) and Advanced Practice Providers (APPs -  Physician Assistants and Nurse Practitioners) who  all work together to provide you with the care you need, when you need it.  We recommend signing up for the patient portal called "MyChart".  Sign up information is provided on this After Visit Summary.  MyChart is used to connect with patients for Virtual Visits (Telemedicine).  Patients are able to view lab/test results, encounter notes, upcoming appointments, etc.  Non-urgent messages can be sent to your provider as well.   To learn more about what you can do with MyChart, go to [email or web address].    Your next appointment:   6 month(s)  The format for your next appointment:   In Person  Provider:   Buford Dresser, MD       [facility] Stump[facility],acting as a scribe for Buford Dresser, MD.,have documented all relevant documentation on the behalf of Buford Dresser, MD,as directed by  Buford Dresser, MD while in the presence of Buford Dresser, MD.  I, Buford Dresser, MD, have reviewed all documentation for this visit. The documentation on 03/16/21 for the exam, diagnosis, procedures, and orders are all accurate and complete.   Signed, Buford Dresser, MD PhD 03/16/2021     [place]

## 2021-03-17 ENCOUNTER — Ambulatory Visit: Payer: Medicare Other | Admitting: Physical Therapy

## 2021-03-18 ENCOUNTER — Other Ambulatory Visit: Payer: Self-pay

## 2021-03-18 ENCOUNTER — Ambulatory Visit (INDEPENDENT_AMBULATORY_CARE_PROVIDER_SITE_OTHER): Payer: Medicare Other | Admitting: Physical Therapy

## 2021-03-18 DIAGNOSIS — M545 Low back pain, unspecified: Secondary | ICD-10-CM

## 2021-03-18 DIAGNOSIS — R2689 Other abnormalities of gait and mobility: Secondary | ICD-10-CM

## 2021-03-18 DIAGNOSIS — M6281 Muscle weakness (generalized): Secondary | ICD-10-CM

## 2021-03-18 NOTE — Therapy (Signed)
OUTPATIENT PHYSICAL THERAPY EVALUATION   Patient Name: Kevin PICKERILL, MD MRN: [id number] DOB:1934-11-06, 87 y.o., male Today's Date: 03/18/21 .   PT End of Session - 03/22/21 2047     Visit Number 1    Number of Visits 12    Date for PT Re-Evaluation 04/29/21    Authorization Type Medicare    PT Start Time 1102    PT Stop Time 1145    PT Time Calculation (min) 43 min    Activity Tolerance Patient tolerated treatment well    Behavior During Therapy WFL for tasks assessed/performed             Past Medical History:  Diagnosis Date   Aortic stenosis    moderate aortic stenosis   Arthritis    Benign prostatic hypertrophy    Chronic systolic heart failure (HCC)    Chronotropic incompetence with sinus node dysfunction (HCC)    Status post Guidant dual-mode, dual-pacing, dual-sensing  pacemaker   implantation now programmed to AAI with recent generator change.   Coronary artery disease    status post multiple prior percutaneous coronary interventions, microvascular angina per Dr Olevia Perches   Diverticulitis sigmoid colon recurrent    Dysfunctional autonomic nervous system    Dyspnea    Heart murmur    History of primary hypertension    Hypercoagulable state ([place])    chronically anticoagulated with coumadin   Hyperlipidemia    Hyperthyroidism    Hypothyroidism    Dr. 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 Lia Foyer,    Prediabetes 09/21/2017   Stroke ([place])    1990   Past Surgical History:  Procedure Laterality Date   AORTIC VALVE REPLACEMENT  03/15/2011   Procedure: AORTIC VALVE REPLACEMENT (AVR);  Surgeon: Gaye Pollack, MD;  Location: [place];  Service: Open Heart Surgery;  Laterality: N/A;   APPENDECTOMY     CARDIAC CATHETERIZATION     11   CARDIOVERSION     CARDIOVERSION  04/15/2011   Procedure: CARDIOVERSION;  Surgeon: Loralie Champagne,  MD;  Location: [place];  Service: Cardiovascular;  Laterality: N/A;   CARDIOVERSION N/A 09/11/2014   Procedure: CARDIOVERSION;  Surgeon: Sueanne Margarita, MD;  Location: [place] [facility];  Service: Cardiovascular;  Laterality: N/A;   CARDIOVERSION N/A 06/27/2015   Procedure: CARDIOVERSION;  Surgeon: Thayer Headings, MD;  Location: [place];  Service: Cardiovascular;  Laterality: N/A;   CARDIOVERSION N/A 07/04/2015   Procedure: CARDIOVERSION;  Surgeon: Evans Lance, MD;  Location: [place];  Service: Cardiovascular;  Laterality: N/A;   CARDIOVERSION N/A 04/13/2017   Procedure: CARDIOVERSION;  Surgeon: Sueanne Margarita, MD;  Location: [facility] ENDOSCOPY;  Service: Cardiovascular;  Laterality: N/A;   COLONOSCOPY     CORONARY STENT INTERVENTION N/A 10/15/2019   Procedure: CORONARY STENT INTERVENTION;  Surgeon: Jettie Booze, MD;  Location: [place] CV LAB;  Service: Cardiovascular;  Laterality: N/A;   EP IMPLANTABLE DEVICE N/A 06/16/2015   Procedure: Pacemaker Implant;  Surgeon: Evans Lance, MD;  Location: [place] CV LAB;  Service: Cardiovascular;  Laterality: N/A;   hemrrhoidectomy     LEFT AND RIGHT HEART CATHETERIZATION WITH CORONARY ANGIOGRAM Bilateral 02/01/2011   Procedure: LEFT AND RIGHT HEART CATHETERIZATION WITH CORONARY ANGIOGRAM;  Surgeon: Hillary Bow, MD;  Location: [facility] CATH LAB;  Service: Cardiovascular;  Laterality: Bilateral;   LEFT HEART CATH  AND CORONARY ANGIOGRAPHY N/A 10/15/2019   Procedure: LEFT HEART CATH AND CORONARY ANGIOGRAPHY;  Surgeon: Jettie Booze, MD;  Location: [place] CV LAB;  Service: Cardiovascular;  Laterality: N/A;   LEFT HEART CATH AND CORONARY ANGIOGRAPHY N/A 10/07/2020   Procedure: LEFT HEART CATH AND CORONARY ANGIOGRAPHY;  Surgeon: Sherren Mocha, MD;  Location: [place] CV LAB;  Service: Cardiovascular;  Laterality: N/A;   LUMBAR LAMINECTOMY/DECOMPRESSION MICRODISCECTOMY Right 02/23/2021   Procedure: Right Lumbar four-five  microdiscectomy;  Surgeon: Eustace Moore, MD;  Location: [place];  Service: Neurosurgery;  Laterality: Right;   MAZE  03/15/2011   Procedure: MAZE;  Surgeon: Gaye Pollack, MD;  Location: [place];  Service: Open Heart Surgery;  Laterality: N/A;   PACEMAKER INSERTION  1991   Guidant PPM, most recent Generator Change by Dr Olevia Perches was 08/22/06   RIGHT/LEFT HEART CATH AND CORONARY ANGIOGRAPHY N/A 07/06/2016   Procedure: Right/Left Heart Cath and Coronary Angiography;  Surgeon: Sherren Mocha, MD;  Location: [place] CV LAB;  Service: Cardiovascular;  Laterality: N/A;   TEE WITHOUT CARDIOVERSION  04/15/2011   Procedure: TRANSESOPHAGEAL ECHOCARDIOGRAM (TEE);  Surgeon: Loralie Champagne, MD;  Location: [place];  Service: Cardiovascular;  Laterality: N/A;   TEE WITHOUT CARDIOVERSION N/A 09/11/2014   Procedure: TRANSESOPHAGEAL ECHOCARDIOGRAM (TEE);  Surgeon: Sueanne Margarita, MD;  Location: [facility] ENDOSCOPY;  Service: Cardiovascular;  Laterality: N/A;   Patient Active Problem List   Diagnosis Date Noted   Pure hypercholesterolemia 09/08/2020   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[place]) 38/18/2993   Chronic systolic heart failure ([place]) 02/27/2020   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   Urinary retention with incomplete bladder emptying 12/05/2019   Physical deconditioning 12/05/2019   Other hemorrhoids 12/05/2019   Adjustment reaction with anxiety 12/05/2019   A-fib ([place]) 10/14/2019   Atrial fibrillation with rapid ventricular response ([place]) 10/13/2019   NSTEMI (non-ST elevated myocardial infarction) ([place]) 10/13/2019   Acute diarrhea 10/13/2019   Normocytic anemia 10/13/2019   Diverticulitis sigmoid colon recurrent    Prediabetes 09/21/2017   Persistent atrial fibrillation ([place]) 04/11/2017   Vitamin D deficiency 05/20/2016   History of pacemaker 05/20/2016   History of CVA (cerebrovascular accident) 05/20/2016   Primary osteoarthritis of both hands 03/10/2016   DDD cervical  spine 03/10/2016   Osteoarthritis of lumbar spine 03/10/2016   Chronic left shoulder pain 03/10/2016   Trochanteric bursitis of right hip 03/10/2016   Other fatigue 03/10/2016   Claudication in peripheral vascular disease ([place]) 03/10/2016   Chronic pain syndrome 03/10/2016   Ocular myasthenia gravis ([place]) 10/28/2015   Typical atrial flutter (HCC)    PVC's (premature ventricular contractions) 01/30/2015   Pacemaker 04/30/2013   IgM monoclonal gammopathy of uncertain significance 02/17/2013   Orthostatic hypotension 04/11/2011   Long term current use of anticoagulant therapy 03/24/2011   S/P AVR (aortic valve replacement) 03/24/2011   Pleural effusion 03/24/2011   Hypothyroidism 11/22/2010   Atrial flutter ([place]) 08/19/2010   Syndrome X (cardiac) ([place]) 04/06/2010   Mixed hyperlipidemia 12/08/2007   PRIMARY HYPERCOAGULABLE STATE 12/08/2007   Coronary artery disease with exertional angina ([place]) 12/08/2007   Coronary artery disease of native artery of native heart with stable angina pectoris ([place]) 12/08/2007   AORTIC STENOSIS/ INSUFFICIENCY, NON-RHEUMATIC 12/08/2007   Atrial fibrillation ([place]) 12/08/2007   Chronotropic incompetence with sinus node dysfunction ([place]) 12/08/2007    PCP: [name], MD  REFERRING PROVIDER: Sherley Bounds  REFERRING DIAG:   THERAPY DIAG:  Muscle weakness (generalized)  Other  abnormalities of gait and mobility  Acute bilateral low back pain without sciatica  ONSET DATE: 02/23/21  SUBJECTIVE:                                                                                                                                                                                           SUBJECTIVE STATEMENT: Onset 12/15 of R LE pain, weakness. Pt had L 4/5 microdiscectomy on R, on 02/23/21.  Lives independently with wife at [place].  Friend/nurse present at visit, helps with medical history. Pt having little pain from back surgery. He states he would like  to be more active, admits to being too sedentary. Also states significant cardiac history.   PERTINENT HISTORY:  Afib, Chronic Heart Failure , Orthostatic Hypotension, OA (shoulders, hands, hips), Pacemaker,   PAIN:  Are you having pain? Yes NPRS scale: 5/10 Pain location: Low back  Pain orientation: Right  PAIN TYPE: aching Pain description: constant  Aggravating factors: standing, walking Relieving factors: sitting, resting.   PRECAUTIONS: Back and ICD/Pacemaker  WEIGHT BEARING RESTRICTIONS No  FALLS:  Has patient fallen in last 6 months? No, Number of falls: 0  LIVING ENVIRONMENT: Lives with: lives with their spouse Lives in: House/apartment Stairs: No;  Has following equipment at home: Single point cane   PLOF: Independent  PATIENT GOALS  general mobility, strengthening, increased ability for walking, endurance, feels limited by A-Fib.    OBJECTIVE:   COGNITION:  Overall cognitive status: Within functional limits for tasks assessed      PALPATION: Fully healed incision at lumbar spine  LUMBARAROM/PROM  A/PROM A/PROM  03/22/2021  Flexion Mod deficit   Extension Mod deficit  Right lateral flexion Mild/mod deficit   Left lateral flexion Mild/mod deficit   Right rotation   Left rotation    (Blank rows = not tested)  LE AROM/PROM:  Hips: mild deficits for flex and rotation. Knees: WFL   LE MMT:  Hips: 4/5;  Knee: 4+/5     GAIT: Distance walked: 35 ft x 4;  Assistive device utilized: Single point cane Level of assistance: Modified independence     PATIENT EDUCATION:  Education details: PT POC, Exam findings,  Person educated: Patient and Armed forces training and education officer method: Explanation and Verbal cues Education comprehension: verbalized understanding, returned demonstration, verbal cues required, and needs further education     ASSESSMENT:  CLINICAL IMPRESSION:   Pt presents with primary deficits of decreased strength and mobility, following  lumbar surgery on 02/23/22. He has decreased lumbar ROM, and mild pain with increased activity. He has decreased strength of LEs, and is overall deconditioned. He has significant cardiac history, which has  been a barrier for his ability to exercise. He has decreased endurance and tolerance for standing activity, walking, and functional activity. Pt to benefit from skilled PT to improve strength, mobility, endurance for functional activity, and education on HEP.   Objective impairments include decreased activity tolerance, decreased balance, decreased coordination, decreased endurance, decreased mobility, difficulty walking, decreased ROM, decreased strength, decreased safety awareness, dizziness, increased muscle spasms, impaired flexibility, improper body mechanics, and pain. These impairments are limiting patient from cleaning, community activity, driving, and shopping. Personal factors including : cardiac, are also affecting patient's functional outcome. Patient will benefit from skilled PT to address above impairments and improve overall function.  REHAB POTENTIAL: Fair   CLINICAL DECISION MAKING: Stable/uncomplicated  EVALUATION COMPLEXITY: Low   GOALS: Goals reviewed with patient? Yes  SHORT TERM GOALS:  STG Name Target Date Goal status  1 Patient will be independent with initial HEP   04/01/21 INITIAL  2 Pt to demo independence with log roll transfer to protect back.  04/01/21 INITIAL         LONG TERM GOALS:   LTG Name Target Date Goal status  1 Patient will be independent with final HEP  04/29/21 INITIAL  2 Pt to report decreased pain in back and LEs to 0-2/10 with walking and activity.   04/29/21 INITIAL  3 Pt to demonstrate improved strength of bil LEs to be at least 4+/5, to improve ability for gait and stairs   04/29/21 INITIAL  4 Pt to demo improved score on TUG (TBD)   04/29/21 INITIAL  5 Pt to demo improved score on 6 min walk test (TBD)   04/29/21 INITIAL     PLAN: PT  FREQUENCY: 1-2x/week  PT DURATION: 6 weeks  PLANNED INTERVENTIONS: Therapeutic exercises, Therapeutic activity, Neuro Muscular re-education, Balance training, Gait training, Patient/Family education, Joint mobilization, Stair training, Vestibular training, Visual/preceptual remediation/compensation, DME instructions, Dry Needling, Spinal mobilization, Cryotherapy, Moist heat, Taping, Traction, and Manual therapy  PLAN FOR NEXT SESSION:   Lyndee Hensen, PT, DPT 9:20 PM  03/22/21

## 2021-03-19 ENCOUNTER — Encounter: Payer: Self-pay | Admitting: Internal Medicine

## 2021-03-19 ENCOUNTER — Ambulatory Visit (INDEPENDENT_AMBULATORY_CARE_PROVIDER_SITE_OTHER): Payer: Medicare Other | Admitting: Internal Medicine

## 2021-03-19 VITALS — BP 112/64 | HR 83 | Ht 73.0 in | Wt 226.0 lb

## 2021-03-19 DIAGNOSIS — I495 Sick sinus syndrome: Secondary | ICD-10-CM

## 2021-03-19 DIAGNOSIS — Z950 Presence of cardiac pacemaker: Secondary | ICD-10-CM

## 2021-03-19 DIAGNOSIS — I5022 Chronic systolic (congestive) heart failure: Secondary | ICD-10-CM | POA: Diagnosis not present

## 2021-03-19 DIAGNOSIS — I25119 Atherosclerotic heart disease of native coronary artery with unspecified angina pectoris: Secondary | ICD-10-CM

## 2021-03-19 DIAGNOSIS — I4819 Other persistent atrial fibrillation: Secondary | ICD-10-CM

## 2021-03-19 DIAGNOSIS — I498 Other specified cardiac arrhythmias: Secondary | ICD-10-CM

## 2021-03-19 NOTE — Patient Instructions (Addendum)
Medication Instructions:  Your physician recommends that you continue on your current medications as directed. Please refer to the Current Medication list given to you today.  Labwork: You will get lab work today:  dig level  Testing/Procedures: None ordered.  Follow-Up: Your physician wants you to follow-up in: 6 months with Cristopher Peru, MD  You will receive a reminder letter in the mail two months in advance. If you don't receive a letter, please call our office to schedule the follow-up appointment.  Remote monitoring is used to monitor your Pacemaker from home. This monitoring reduces the number of office visits required to check your device to one time per year. It allows [place] to keep an eye on the functioning of your device to ensure it is working properly. You are scheduled for a device check from home on 05/08/2021. You may send your transmission at any time that day. If you have a wireless device, the transmission will be sent automatically. After your physician reviews your transmission, you will receive a postcard with your next transmission date.  Any Other Special Instructions Will Be Listed Below (If Applicable).  If you need a refill on your cardiac medications before your next appointment, please call your pharmacy.

## 2021-03-19 NOTE — Progress Notes (Signed)
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 He is a pleasant 87 yo man with persistent atrial fib, sinus node dysfunction, s/p PPM insertion, AS s/p AVR, HTN, and CAD. He has also had atrial flutter with a RVR coming from the [place]. He most recently has had atrial fib with a fairly slow VR and ventricular pacing.  Allergies  Allergen Reactions   Contrast Media [Iodinated Contrast Media] Hives   Gadolinium Derivatives Hives   Metrizamide Hives     Current Outpatient Medications  Medication Sig Dispense Refill   apixaban (ELIQUIS) 5 MG TABS tablet TAKE 1 TABLET BY MOUTH 2 TIMES DAILY. 180 tablet 1   bisacodyl (DULCOLAX) 5 MG EC tablet Take 5 mg by mouth every other day.     Cholecalciferol (VITAMIN D3) 2000 units capsule Take 2,000 Units by mouth daily.     Coenzyme Q10 200 MG TABS Take 200 mg by mouth daily.     cyclobenzaprine (FLEXERIL) 10 MG tablet Take 10 mg by mouth 3 (three) times daily as needed for muscle spasms.     desoximetasone (TOPICORT) 0.25 % cream Apply 1 application topically 2 (two) times daily. 60 g 0   diclofenac Sodium (VOLTAREN) 1 % GEL Apply 2 g topically 2 (two) times daily as needed (knee pain).     digoxin (LANOXIN) 0.125 MG tablet Take 1 tablet (0.125 mg total) by mouth daily. 90 tablet 3   diltiazem (CARDIZEM) 30 MG tablet Take 30 mg by mouth as needed (heart rate).     doxazosin (CARDURA) 4 MG tablet Take 1 tablet by mouth daily 90 tablet 3   ezetimibe (ZETIA) 10 MG tablet Take 1 tablet (10 mg total) by mouth daily. 90 tablet 3   HYDROcodone-acetaminophen (NORCO) 5-325 MG tablet Take 1 tablet by mouth 3 (three) times daily. 90 tablet 0   LORazepam (ATIVAN) 1 MG tablet TAKE 1 TABLET BY MOUTH AT BEDTIME. MAY ALSO TAKE 1/2 TABLET DAILY AS NEEDED FOR ANXIETY. 45 tablet 5   magnesium oxide (MAG-OX) 400 MG tablet Take 400 mg by mouth daily.     metoprolol succinate (TOPROL-XL) 50 MG 24 hr tablet Take 1 tablet (50 mg total) by mouth 2 times daily. Take with or  immediately following a meal. 180 tablet 3   mometasone (ELOCON) 0.1 % cream Apply 1 application topically daily. 15 g 0   Multiple Vitamins-Minerals (MULTIVITAMIN WITH MINERALS) tablet Take 1 tablet by mouth daily.     nitroGLYCERIN (NITROSTAT) 0.4 MG SL tablet PLACE 1 TABLET UNDER THE TONGUE EVERY 5 MINUTES AS NEEDED FOR CHEST PAIN. 25 tablet 5   OVER THE COUNTER MEDICATION Apply 1 patch topically daily as needed (neck and shoulder pain). Chinese pain patch     oxyCODONE-acetaminophen (PERCOCET/ROXICET) 5-325 MG tablet Take 1 tablet by mouth every 4 (four) hours as needed for severe pain. 30 tablet 0   pantoprazole (PROTONIX) 40 MG tablet TAKE 1 TABLET BY MOUTH DAILY. 90 tablet 3   polyethylene glycol (MIRALAX / GLYCOLAX) 17 g packet Take 17 g by mouth daily as needed for mild constipation.      potassium chloride SA (KLOR-CON) 20 MEQ tablet TAKE 1 TABLET BY MOUTH DAILY. 90 tablet 3   predniSONE (DELTASONE) 10 MG tablet Take 1 tablet (10 mg total) by mouth daily with breakfast. (Patient taking differently: Take 40 mg by mouth daily with breakfast. 5 mg) 100 tablet 3   predniSONE (DELTASONE) 50 MG tablet 1 p.o. daily x4 (Patient taking  differently: Take 50 mg by mouth. 1 p.o. daily x4) 4 tablet 0   pregabalin (LYRICA) 75 MG capsule Take 75 mg by mouth 2 (two) times daily.     rosuvastatin (CRESTOR) 20 MG tablet TAKE 1 TABLET (20 MG TOTAL) BY MOUTH AT BEDTIME. 90 tablet 3   tiZANidine (ZANAFLEX) 2 MG tablet Take 1 tablet by mouth 2 times daily. 14 tablet 0   No current facility-administered medications for this visit.     Past Medical History:  Diagnosis Date   Aortic stenosis    moderate aortic stenosis   Arthritis    Benign prostatic hypertrophy    Chronic systolic heart failure (HCC)    Chronotropic incompetence with sinus node dysfunction (HCC)    Status post Guidant dual-mode, dual-pacing, dual-sensing  pacemaker   implantation now programmed to AAI with recent generator change.    Coronary artery disease    status post multiple prior percutaneous coronary interventions, microvascular angina per Dr Olevia Perches   Diverticulitis sigmoid colon recurrent    Dysfunctional autonomic nervous system    Dyspnea    Heart murmur    History of primary hypertension    Hypercoagulable state ([place])    chronically anticoagulated with coumadin   Hyperlipidemia    Hyperthyroidism    Hypothyroidism    Dr. 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 Lia Foyer,    Prediabetes 09/21/2017   Stroke ([place])    1990    ROS:   All systems reviewed and negative except as noted in the HPI.   Past Surgical History:  Procedure Laterality Date   AORTIC VALVE REPLACEMENT  03/15/2011   Procedure: AORTIC VALVE REPLACEMENT (AVR);  Surgeon: Gaye Pollack, MD;  Location: [place];  Service: Open Heart Surgery;  Laterality: N/A;   APPENDECTOMY     CARDIAC CATHETERIZATION     11   CARDIOVERSION     CARDIOVERSION  04/15/2011   Procedure: CARDIOVERSION;  Surgeon: Loralie Champagne, MD;  Location: [place];  Service: Cardiovascular;  Laterality: N/A;   CARDIOVERSION N/A 09/11/2014   Procedure: CARDIOVERSION;  Surgeon: Sueanne Margarita, MD;  Location: [place] ENDOSCOPY;  Service: Cardiovascular;  Laterality: N/A;   CARDIOVERSION N/A 06/27/2015   Procedure: CARDIOVERSION;  Surgeon: Thayer Headings, MD;  Location: [place];  Service: Cardiovascular;  Laterality: N/A;   CARDIOVERSION N/A 07/04/2015   Procedure: CARDIOVERSION;  Surgeon: Evans Lance, MD;  Location: [place];  Service: Cardiovascular;  Laterality: N/A;   CARDIOVERSION N/A 04/13/2017   Procedure: CARDIOVERSION;  Surgeon: Sueanne Margarita, MD;  Location: [facility] ENDOSCOPY;  Service: Cardiovascular;  Laterality: N/A;   COLONOSCOPY     CORONARY STENT INTERVENTION N/A 10/15/2019   Procedure: CORONARY STENT INTERVENTION;  Surgeon:  Jettie Booze, MD;  Location: [place] CV LAB;  Service: Cardiovascular;  Laterality: N/A;   EP IMPLANTABLE DEVICE N/A 06/16/2015   Procedure: Pacemaker Implant;  Surgeon: Evans Lance, MD;  Location: [place] CV LAB;  Service: Cardiovascular;  Laterality: N/A;   hemrrhoidectomy     LEFT AND RIGHT HEART CATHETERIZATION WITH CORONARY ANGIOGRAM Bilateral 02/01/2011   Procedure: LEFT AND RIGHT HEART CATHETERIZATION WITH CORONARY ANGIOGRAM;  Surgeon: Hillary Bow, MD;  Location: [facility] CATH LAB;  Service: Cardiovascular;  Laterality: Bilateral;   LEFT HEART CATH AND CORONARY ANGIOGRAPHY N/A 10/15/2019   Procedure: LEFT HEART CATH AND CORONARY  ANGIOGRAPHY;  Surgeon: Jettie Booze, MD;  Location: [place] CV LAB;  Service: Cardiovascular;  Laterality: N/A;   LEFT HEART CATH AND CORONARY ANGIOGRAPHY N/A 10/07/2020   Procedure: LEFT HEART CATH AND CORONARY ANGIOGRAPHY;  Surgeon: Sherren Mocha, MD;  Location: [place] CV LAB;  Service: Cardiovascular;  Laterality: N/A;   LUMBAR LAMINECTOMY/DECOMPRESSION MICRODISCECTOMY Right 02/23/2021   Procedure: Right Lumbar four-five microdiscectomy;  Surgeon: Eustace Moore, MD;  Location: [place];  Service: Neurosurgery;  Laterality: Right;   MAZE  03/15/2011   Procedure: MAZE;  Surgeon: Gaye Pollack, MD;  Location: [place];  Service: Open Heart Surgery;  Laterality: N/A;   PACEMAKER INSERTION  1991   Guidant PPM, most recent Generator Change by Dr Olevia Perches was 08/22/06   RIGHT/LEFT HEART CATH AND CORONARY ANGIOGRAPHY N/A 07/06/2016   Procedure: Right/Left Heart Cath and Coronary Angiography;  Surgeon: Sherren Mocha, MD;  Location: [place] CV LAB;  Service: Cardiovascular;  Laterality: N/A;   TEE WITHOUT CARDIOVERSION  04/15/2011   Procedure: TRANSESOPHAGEAL ECHOCARDIOGRAM (TEE);  Surgeon: Loralie Champagne, MD;  Location: [place];  Service: Cardiovascular;  Laterality: N/A;   TEE WITHOUT CARDIOVERSION N/A 09/11/2014   Procedure:  TRANSESOPHAGEAL ECHOCARDIOGRAM (TEE);  Surgeon: Sueanne Margarita, MD;  Location: [facility] ENDOSCOPY;  Service: Cardiovascular;  Laterality: N/A;     Family History  Problem Relation Age of Onset   Heart disease Brother        Twin brother has coronary disease and recent AVR for AS   CAD Brother    Atrial fibrillation Brother    Sarcoidosis Brother    Depression Daughter    Anorexia nervosa Daughter    Hypertension Son    Anesthesia problems Neg Hx    Hypotension Neg Hx    Malignant hyperthermia Neg Hx    Pseudochol deficiency Neg Hx      Social History   Socioeconomic History   Marital status: Married    Spouse name: Baker Janus    Number of children: 3   Years of education: Not on file   Highest education level: Not on file  Occupational History   Occupation: Retired    Comment: Physician  Tobacco Use   Smoking status: Never   Smokeless tobacco: Never  Vaping Use   Vaping Use: Never used  Substance and Sexual Activity   Alcohol use: No    Alcohol/week: 0.0 standard drinks   Drug use: No   Sexual activity: Not Currently  Other Topics Concern   Not on file  Social History Narrative   Married to [place]. Has grown children   Retired Horticulturist, commercial MD      Never smoker no alcohol      Social Determinants of Radio broadcast assistant Strain: Not on file  Food Insecurity: Not on file  Transportation Needs: Not on file  Physical Activity: Not on file  Stress: Not on file  Social Connections: Not on file  Intimate Partner Violence: Not on file     BP 112/64    Pulse 83    Ht 6\' 1"  (1.854 m)    Wt 226 lb (102.5 kg)    SpO2 95%    BMI 29.82 kg/m   Physical Exam:  Well appearing 87 yo man, NAD HEENT: Unremarkable Neck:  No JVD, no thyromegally Lymphatics:  No adenopathy Back:  No CVA tenderness Lungs:  Clear with no wheezes HEART:  Regular rate rhythm, no murmurs, no rubs, no clicks Abd:  soft, positive bowel  sounds, no organomegally, no rebound, no guarding Ext:  2 plus  pulses, no edema, no cyanosis, no clubbing Skin:  No rashes no nodules Neuro:  CN II through XII intact, motor grossly intact  DEVICE  Normal device function.  See PaceArt for details.   Assess/Plan:  Atrial fib and flutter - his VR is controlled and his rate is actually a little slow. I have recommended we check a digoxin level. If high we will reduce his dose of digoxin. HTN - his bp is well controlled. I asked him to keep tract and let us know if it goes too low. PPM - his [employer] DDD PM is working normally. His VR is a little slower than usual. CAD - his angina is improved. He will continue his current meds.  Carleene Overlie Alden Bensinger,MD

## 2021-03-20 ENCOUNTER — Other Ambulatory Visit: Payer: Self-pay | Admitting: Cardiovascular Disease

## 2021-03-20 ENCOUNTER — Other Ambulatory Visit (HOSPITAL_BASED_OUTPATIENT_CLINIC_OR_DEPARTMENT_OTHER): Payer: Self-pay

## 2021-03-20 DIAGNOSIS — I4819 Other persistent atrial fibrillation: Secondary | ICD-10-CM

## 2021-03-20 LAB — DIGOXIN LEVEL: Digoxin, Serum: 0.6 ng/mL (ref 0.5–0.9)

## 2021-03-20 MED ORDER — POTASSIUM CHLORIDE CRYS ER 20 MEQ PO TBCR
EXTENDED_RELEASE_TABLET | Freq: Every day | ORAL | 2 refills | Status: DC
  Filled 2021-03-20: qty 90, 90d supply, fill #0
  Filled 2021-06-23: qty 90, 90d supply, fill #1
  Filled 2021-09-23: qty 90, 90d supply, fill #2

## 2021-03-20 MED ORDER — APIXABAN 5 MG PO TABS
ORAL_TABLET | Freq: Two times a day (BID) | ORAL | 2 refills | Status: DC
  Filled 2021-03-20 – 2021-03-24 (×2): qty 180, 90d supply, fill #0
  Filled 2021-06-23: qty 180, 90d supply, fill #1
  Filled 2021-09-23: qty 180, 90d supply, fill #2

## 2021-03-20 NOTE — Telephone Encounter (Signed)
Eliquis 5mg  refill request received. Patient is 87 years old, weight-102.5kg, Crea-0.93 on 02/23/2021, Diagnosis-Afib/flutter, and last seen by Dr. Lovena Le on 03/19/2021. Dose is appropriate based on dosing criteria. Will send in refill to requested pharmacy.

## 2021-03-22 ENCOUNTER — Encounter: Payer: Self-pay | Admitting: Physical Therapy

## 2021-03-23 ENCOUNTER — Encounter: Payer: Self-pay | Admitting: Physical Therapy

## 2021-03-23 ENCOUNTER — Ambulatory Visit (INDEPENDENT_AMBULATORY_CARE_PROVIDER_SITE_OTHER): Payer: Medicare Other | Admitting: Physical Therapy

## 2021-03-23 ENCOUNTER — Other Ambulatory Visit (HOSPITAL_BASED_OUTPATIENT_CLINIC_OR_DEPARTMENT_OTHER): Payer: Self-pay

## 2021-03-23 ENCOUNTER — Other Ambulatory Visit: Payer: Self-pay

## 2021-03-23 DIAGNOSIS — R2689 Other abnormalities of gait and mobility: Secondary | ICD-10-CM | POA: Diagnosis not present

## 2021-03-23 DIAGNOSIS — M545 Low back pain, unspecified: Secondary | ICD-10-CM

## 2021-03-23 DIAGNOSIS — M6281 Muscle weakness (generalized): Secondary | ICD-10-CM

## 2021-03-23 NOTE — Therapy (Signed)
OUTPATIENT PHYSICAL THERAPY TREATMENT    Patient Name: Kevin COLL, MD MRN: [id number] DOB:[birth year], 87 y.o., male Today's Date: 03/18/21 .   PT End of Session - 03/23/21 1333     Visit Number 2    Number of Visits 12    Date for PT Re-Evaluation 04/29/21    Authorization Type Medicare    PT Start Time 1220    PT Stop Time 1300    PT Time Calculation (min) 40 min    Activity Tolerance Patient tolerated treatment well    Behavior During Therapy WFL for tasks assessed/performed              Past Medical History:  Diagnosis Date   Aortic stenosis    moderate aortic stenosis   Arthritis    Benign prostatic hypertrophy    Chronic systolic heart failure (HCC)    Chronotropic incompetence with sinus node dysfunction (HCC)    Status post Guidant dual-mode, dual-pacing, dual-sensing  pacemaker   implantation now programmed to AAI with recent generator change.   Coronary artery disease    status post multiple prior percutaneous coronary interventions, microvascular angina per Dr Olevia Perches   Diverticulitis sigmoid colon recurrent    Dysfunctional autonomic nervous system    Dyspnea    Heart murmur    History of primary hypertension    Hypercoagulable state ([place])    chronically anticoagulated with coumadin   Hyperlipidemia    Hyperthyroidism    Hypothyroidism    Dr. 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 Lia Foyer,    Prediabetes 09/21/2017   Stroke ([place])    1990   Past Surgical History:  Procedure Laterality Date   AORTIC VALVE REPLACEMENT  03/15/2011   Procedure: AORTIC VALVE REPLACEMENT (AVR);  Surgeon: Gaye Pollack, MD;  Location: [place];  Service: Open Heart Surgery;  Laterality: N/A;   APPENDECTOMY     CARDIAC CATHETERIZATION     11   CARDIOVERSION     CARDIOVERSION  04/15/2011   Procedure: CARDIOVERSION;  Surgeon: Loralie Champagne,  MD;  Location: [place];  Service: Cardiovascular;  Laterality: N/A;   CARDIOVERSION N/A 09/11/2014   Procedure: CARDIOVERSION;  Surgeon: Sueanne Margarita, MD;  Location: [facility] [facility]COPY;  Service: Cardiovascular;  Laterality: N/A;   CARDIOVERSION N/A 06/27/2015   Procedure: CARDIOVERSION;  Surgeon: Thayer Headings, MD;  Location: [place];  Service: Cardiovascular;  Laterality: N/A;   CARDIOVERSION N/A 07/04/2015   Procedure: CARDIOVERSION;  Surgeon: Evans Lance, MD;  Location: [place];  Service: Cardiovascular;  Laterality: N/A;   CARDIOVERSION N/A 04/13/2017   Procedure: CARDIOVERSION;  Surgeon: Sueanne Margarita, MD;  Location: [facility] ENDOSCOPY;  Service: Cardiovascular;  Laterality: N/A;   COLONOSCOPY     CORONARY STENT INTERVENTION N/A 10/15/2019   Procedure: CORONARY STENT INTERVENTION;  Surgeon: Jettie Booze, MD;  Location: [place] CV LAB;  Service: Cardiovascular;  Laterality: N/A;   EP IMPLANTABLE DEVICE N/A 06/16/2015   Procedure: Pacemaker Implant;  Surgeon: Evans Lance, MD;  Location: [place] CV LAB;  Service: Cardiovascular;  Laterality: N/A;   hemrrhoidectomy     LEFT AND RIGHT HEART CATHETERIZATION WITH CORONARY ANGIOGRAM Bilateral 02/01/2011   Procedure: LEFT AND RIGHT HEART CATHETERIZATION WITH CORONARY ANGIOGRAM;  Surgeon: Hillary Bow, MD;  Location: [facility] CATH LAB;  Service: Cardiovascular;  Laterality: Bilateral;   LEFT  HEART CATH AND CORONARY ANGIOGRAPHY N/A 10/15/2019   Procedure: LEFT HEART CATH AND CORONARY ANGIOGRAPHY;  Surgeon: Jettie Booze, MD;  Location: [place] [facility] LAB;  Service: Cardiovascular;  Laterality: N/A;   LEFT HEART CATH AND CORONARY ANGIOGRAPHY N/A 10/07/2020   Procedure: LEFT HEART CATH AND CORONARY ANGIOGRAPHY;  Surgeon: Sherren Mocha, MD;  Location: [place] CV LAB;  Service: Cardiovascular;  Laterality: N/A;   LUMBAR LAMINECTOMY/DECOMPRESSION MICRODISCECTOMY Right 02/23/2021   Procedure: Right Lumbar four-five  microdiscectomy;  Surgeon: Eustace Moore, MD;  Location: [place];  Service: Neurosurgery;  Laterality: Right;   MAZE  03/15/2011   Procedure: MAZE;  Surgeon: Gaye Pollack, MD;  Location: [place];  Service: Open Heart Surgery;  Laterality: N/A;   PACEMAKER INSERTION  1991   Guidant PPM, most recent Generator Change by Dr Olevia Perches was 08/22/06   RIGHT/LEFT HEART CATH AND CORONARY ANGIOGRAPHY N/A 07/06/2016   Procedure: Right/Left Heart Cath and Coronary Angiography;  Surgeon: Sherren Mocha, MD;  Location: [place] CV LAB;  Service: Cardiovascular;  Laterality: N/A;   TEE WITHOUT CARDIOVERSION  04/15/2011   Procedure: TRANSESOPHAGEAL ECHOCARDIOGRAM (TEE);  Surgeon: Loralie Champagne, MD;  Location: [place];  Service: Cardiovascular;  Laterality: N/A;   TEE WITHOUT CARDIOVERSION N/A 09/11/2014   Procedure: TRANSESOPHAGEAL ECHOCARDIOGRAM (TEE);  Surgeon: Sueanne Margarita, MD;  Location[facility];  Service: Cardiovascular;  Laterality: N/A;   Patient Active Problem List   Diagnosis Date Noted   Pure hypercholesterolemia 09/08/2020   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[place]) 81/19/1478   Chronic systolic heart failure ([place]) 02/27/2020   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   Urinary retention with incomplete bladder emptying 12/05/2019   Physical deconditioning 12/05/2019   Other hemorrhoids 12/05/2019   Adjustment reaction with anxiety 12/05/2019   A-fib ([place]) 10/14/2019   Atrial fibrillation with rapid ventricular response ([place]) 10/13/2019   NSTEMI (non-ST elevated myocardial infarction) ([place]) 10/13/2019   Acute diarrhea 10/13/2019   Normocytic anemia 10/13/2019   Diverticulitis sigmoid colon recurrent    Prediabetes 09/21/2017   Persistent atrial fibrillation ([place]) 04/11/2017   Vitamin D deficiency 05/20/2016   History of pacemaker 05/20/2016   History of CVA (cerebrovascular accident) 05/20/2016   Primary osteoarthritis of both hands 03/10/2016   DDD cervical  spine 03/10/2016   Osteoarthritis of lumbar spine 03/10/2016   Chronic left shoulder pain 03/10/2016   Trochanteric bursitis of right hip 03/10/2016   Other fatigue 03/10/2016   Claudication in peripheral vascular disease ([place]) 03/10/2016   Chronic pain syndrome 03/10/2016   Ocular myasthenia gravis ([place]) 10/28/2015   Typical atrial flutter (HCC)    PVC's (premature ventricular contractions) 01/30/2015   Pacemaker 04/30/2013   IgM monoclonal gammopathy of uncertain significance 02/17/2013   Orthostatic hypotension 04/11/2011   Long term current use of anticoagulant therapy 03/24/2011   S/P AVR (aortic valve replacement) 03/24/2011   Pleural effusion 03/24/2011   Hypothyroidism 11/22/2010   Atrial flutter ([place]) 08/19/2010   Syndrome X (cardiac) ([place]) 04/06/2010   Mixed hyperlipidemia 12/08/2007   PRIMARY HYPERCOAGULABLE STATE 12/08/2007   Coronary artery disease with exertional angina ([place]) 12/08/2007   Coronary artery disease of native artery of native heart with stable angina pectoris ([place]) 12/08/2007   AORTIC STENOSIS/ INSUFFICIENCY, NON-RHEUMATIC 12/08/2007   Atrial fibrillation ([place]) 12/08/2007   Chronotropic incompetence with sinus node dysfunction ([place]) 12/08/2007    PCP: [name], MD  REFERRING PROVIDER: Sherley Bounds  REFERRING DIAG: S/P lumbar microdiscectomy   THERAPY DIAG:  Muscle weakness (generalized)  Other abnormalities of gait and mobility  Acute bilateral low back pain without sciatica  ONSET DATE: 02/23/21  SUBJECTIVE:                                                                                                                                                                                           SUBJECTIVE STATEMENT: Pt drove to appt today independently, feeling pretty good. States soreness/tightness in low back. Feels most limited by "weakness".  PERTINENT HISTORY:  Afib, Chronic Heart Failure , Orthostatic Hypotension, OA (shoulders,  hands, hips), Pacemaker,   PAIN:  Are you having pain? Yes NPRS scale: 5/10 Pain location: Low back  Pain orientation: Right  PAIN TYPE: aching Pain description: constant  Aggravating factors: standing, walking Relieving factors: sitting, resting.   PRECAUTIONS: Back and ICD/Pacemaker  WEIGHT BEARING RESTRICTIONS No  FALLS:  Has patient fallen in last 6 months? No, Number of falls: 0  LIVING ENVIRONMENT: Lives with: lives with their spouse Lives in: House/apartment Stairs: No;  Has following equipment at home: Single point cane   PLOF: Independent  PATIENT GOALS  general mobility, strengthening, increased ability for walking, endurance, feels limited by A-Fib.    OBJECTIVE:   COGNITION:  Overall cognitive status: Within functional limits for tasks assessed      PALPATION: Fully healed incision at lumbar spine  LUMBARAROM/PROM  A/PROM A/PROM  03/23/2021  Flexion Mod deficit   Extension Mod deficit  Right lateral flexion Mild/mod deficit   Left lateral flexion Mild/mod deficit   Right rotation   Left rotation    (Blank rows = not tested)  LE AROM/PROM:  Hips: mild deficits for flex and rotation. Knees: WFL   LE MMT:  Hips: 4/5;  Knee: 4+/5     Todays Treatment:  Therapeutic Exercise:  Aerobic: Supine: hooklying ball squeeze and hip abd Iso Blue TB x 20 each with TA;  TA x 10; practice for supine to sit/ log roll transfer x 2;  Seated: sit to stand x 10;  Standing:  Stretches:  light SKTC with towel 30 sec x 2 bil; gentile pelvic tilts x 10; Seated HS stretch 30 sec x 3 bil;  Educated on HEP for shoulder OA, aarom for flexion and ER in supine, pulleys x 15 flexion;   Neuromuscular Re-education: Manual Therapy: Therapeutic Activity: Self Care: Trigger Point Dry Needling:  Modalities:    PATIENT EDUCATION:  Education details: Education on initial HEP Person educated: Patient Education method: Explanation and Verbal cues Education  comprehension: verbalized understanding, returned demonstration, verbal cues required, and needs further education  HOME EXERCISE PROGRAM:  Access Code:  [email or web address] URL: [email or web address] Date: 03/23/2021 Prepared by: Lyndee Hensen  Exercises Seated Hamstring Stretch - 2 x daily - 3 reps - 30 hold Hooklying Single Knee to Chest Stretch with Towel - 2 x daily - 3 reps - 30 hold Supine Shoulder Flexion AAROM with Hands Clasped - 2 x daily - 1 sets - 10 reps Supine Chest Stretch with Elbows Bent - 1-2 x daily - 1 sets - 10 reps Sit to Stand Without Arm Support - 1 x daily - 1 sets - 5-10 reps    ASSESSMENT:  CLINICAL IMPRESSION: 03/23/21: Pt educated on initial HEP for light lumbar stretching. Pt asked about ROM for bil shoulder OA, so HEP given for this as well. Pt with increased tightness and limitation in R hamstring vs L. Plan to continue light LE strength, functional strength and activity next visit. Will assess endurance, 6 min walk test, and walking as well. Pt with noted weakness and decreased step height and length with L LE today.    Objective impairments include decreased activity tolerance, decreased balance, decreased coordination, decreased endurance, decreased mobility, difficulty walking, decreased ROM, decreased strength, decreased safety awareness, dizziness, increased muscle spasms, impaired flexibility, improper body mechanics, and pain. These impairments are limiting patient from cleaning, community activity, driving, and shopping. Personal factors including : cardiac, are also affecting patient's functional outcome. Patient will benefit from skilled PT to address above impairments and improve overall function.  REHAB POTENTIAL: Fair   CLINICAL DECISION MAKING: Stable/uncomplicated  EVALUATION COMPLEXITY: Low   GOALS: Goals reviewed with patient? Yes  SHORT TERM GOALS:  STG Name Target Date Goal status  1 Patient will be independent with initial  HEP   04/01/21 INITIAL  2 Pt to demo independence with log roll transfer to protect back.  04/01/21 INITIAL         LONG TERM GOALS:   LTG Name Target Date Goal status  1 Patient will be independent with final HEP  04/29/21 INITIAL  2 Pt to report decreased pain in back and LEs to 0-2/10 with walking and activity.   04/29/21 INITIAL  3 Pt to demonstrate improved strength of bil LEs to be at least 4+/5, to improve ability for gait and stairs   04/29/21 INITIAL  4 Pt to demo improved score on TUG (TBD)   04/29/21 INITIAL  5 Pt to demo improved score on 6 min walk test (TBD)   04/29/21 INITIAL     PLAN: PT FREQUENCY: 1-2x/week  PT DURATION: 6 weeks  PLANNED INTERVENTIONS: Therapeutic exercises, Therapeutic activity, Neuro Muscular re-education, Balance training, Gait training, Patient/Family education, Joint mobilization, Stair training, Vestibular training, Visual/preceptual remediation/compensation, DME instructions, Dry Needling, Spinal mobilization, Cryotherapy, Moist heat, Taping, Traction, and Manual therapy  PLAN FOR NEXT SESSION:   Lyndee Hensen, PT, DPT 1:35 PM  03/23/21

## 2021-03-24 ENCOUNTER — Encounter: Payer: Self-pay | Admitting: Internal Medicine

## 2021-03-24 ENCOUNTER — Other Ambulatory Visit (HOSPITAL_BASED_OUTPATIENT_CLINIC_OR_DEPARTMENT_OTHER): Payer: Self-pay

## 2021-03-25 ENCOUNTER — Ambulatory Visit (INDEPENDENT_AMBULATORY_CARE_PROVIDER_SITE_OTHER): Payer: Medicare Other | Admitting: Physical Therapy

## 2021-03-25 ENCOUNTER — Telehealth: Payer: Self-pay

## 2021-03-25 ENCOUNTER — Non-Acute Institutional Stay: Payer: Medicare Other | Admitting: Internal Medicine

## 2021-03-25 ENCOUNTER — Encounter: Payer: Self-pay | Admitting: Internal Medicine

## 2021-03-25 ENCOUNTER — Other Ambulatory Visit (HOSPITAL_BASED_OUTPATIENT_CLINIC_OR_DEPARTMENT_OTHER): Payer: Self-pay

## 2021-03-25 ENCOUNTER — Other Ambulatory Visit: Payer: Self-pay

## 2021-03-25 ENCOUNTER — Encounter: Payer: Self-pay | Admitting: Physical Therapy

## 2021-03-25 VITALS — BP 122/70 | HR 73 | Temp 97.3°F | Ht 73.0 in | Wt 226.0 lb

## 2021-03-25 DIAGNOSIS — I25118 Atherosclerotic heart disease of native coronary artery with other forms of angina pectoris: Secondary | ICD-10-CM

## 2021-03-25 DIAGNOSIS — I5022 Chronic systolic (congestive) heart failure: Secondary | ICD-10-CM | POA: Diagnosis not present

## 2021-03-25 DIAGNOSIS — M545 Low back pain, unspecified: Secondary | ICD-10-CM

## 2021-03-25 DIAGNOSIS — Z9889 Other specified postprocedural states: Secondary | ICD-10-CM

## 2021-03-25 DIAGNOSIS — R5382 Chronic fatigue, unspecified: Secondary | ICD-10-CM | POA: Diagnosis not present

## 2021-03-25 DIAGNOSIS — R5381 Other malaise: Secondary | ICD-10-CM | POA: Diagnosis not present

## 2021-03-25 DIAGNOSIS — E785 Hyperlipidemia, unspecified: Secondary | ICD-10-CM | POA: Diagnosis not present

## 2021-03-25 DIAGNOSIS — K5903 Drug induced constipation: Secondary | ICD-10-CM

## 2021-03-25 DIAGNOSIS — R2689 Other abnormalities of gait and mobility: Secondary | ICD-10-CM | POA: Diagnosis not present

## 2021-03-25 DIAGNOSIS — M6281 Muscle weakness (generalized): Secondary | ICD-10-CM | POA: Diagnosis not present

## 2021-03-25 DIAGNOSIS — N401 Enlarged prostate with lower urinary tract symptoms: Secondary | ICD-10-CM

## 2021-03-25 DIAGNOSIS — I4892 Unspecified atrial flutter: Secondary | ICD-10-CM | POA: Diagnosis not present

## 2021-03-25 DIAGNOSIS — K219 Gastro-esophageal reflux disease without esophagitis: Secondary | ICD-10-CM | POA: Diagnosis not present

## 2021-03-25 DIAGNOSIS — M158 Other polyosteoarthritis: Secondary | ICD-10-CM

## 2021-03-25 MED ORDER — LORAZEPAM 1 MG PO TABS
ORAL_TABLET | ORAL | 5 refills | Status: DC
  Filled 2021-03-25: qty 45, fill #0
  Filled 2021-04-06: qty 45, 30d supply, fill #0
  Filled 2021-05-14: qty 45, 30d supply, fill #1
  Filled 2021-06-11: qty 45, 30d supply, fill #2
  Filled 2021-07-17: qty 45, 30d supply, fill #3
  Filled 2021-08-07 – 2021-08-16 (×2): qty 45, 30d supply, fill #4
  Filled 2021-09-14 – 2021-09-17 (×2): qty 45, 30d supply, fill #5

## 2021-03-25 MED ORDER — HYDROCODONE-ACETAMINOPHEN 5-325 MG PO TABS
1.0000 | ORAL_TABLET | Freq: Three times a day (TID) | ORAL | 0 refills | Status: DC
  Filled 2021-03-25 – 2021-04-10 (×2): qty 90, 30d supply, fill #0

## 2021-03-25 MED ORDER — METOPROLOL SUCCINATE ER 50 MG PO TB24
ORAL_TABLET | ORAL | 3 refills | Status: DC
  Filled 2021-03-25: qty 145, 90d supply, fill #0
  Filled 2021-06-18: qty 145, 90d supply, fill #1
  Filled 2021-09-16: qty 145, 90d supply, fill #2

## 2021-03-25 NOTE — Therapy (Deleted)
OUTPATIENT PHYSICAL THERAPY TREATMENT    Patient Name: Kevin ASTARITA, MD MRN: [id number] DOB:12/29/1934, 87 y.o., male Today's Date: 03/25/2021    Past Medical History:  Diagnosis Date   Aortic stenosis    moderate aortic stenosis   Arthritis    Benign prostatic hypertrophy    Chronic systolic heart failure (HCC)    Chronotropic incompetence with sinus node dysfunction (HCC)    Status post Guidant dual-mode, dual-pacing, dual-sensing  pacemaker   implantation now programmed to AAI with recent generator change.   Coronary artery disease    status post multiple prior percutaneous coronary interventions, microvascular angina per Dr Olevia Perches   Diverticulitis sigmoid colon recurrent    Dysfunctional autonomic nervous system    Dyspnea    Heart murmur    History of primary hypertension    Hypercoagulable state ([place])    chronically anticoagulated with coumadin   Hyperlipidemia    Hyperthyroidism    Hypothyroidism    Dr. 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 Lia Foyer,    Prediabetes 09/21/2017   Stroke ([place])    1990   Past Surgical History:  Procedure Laterality Date   AORTIC VALVE REPLACEMENT  03/15/2011   Procedure: AORTIC VALVE REPLACEMENT (AVR);  Surgeon: Gaye Pollack, MD;  Location: [place];  Service: Open Heart Surgery;  Laterality: N/A;   APPENDECTOMY     CARDIAC CATHETERIZATION     11   CARDIOVERSION     CARDIOVERSION  04/15/2011   Procedure: CARDIOVERSION;  Surgeon: Loralie Champagne, MD;  Location: [place];  Service: Cardiovascular;  Laterality: N/A;   CARDIOVERSION N/A 09/11/2014   Procedure: CARDIOVERSION;  Surgeon: Sueanne Margarita, MD;  Location: [place] [facility];  Service: Cardiovascular;  Laterality: N/A;   CARDIOVERSION N/A 06/27/2015   Procedure: CARDIOVERSION;  Surgeon: Thayer Headings, MD;  Location: [place];  Service:  Cardiovascular;  Laterality: N/A;   CARDIOVERSION N/A 07/04/2015   Procedure: CARDIOVERSION;  Surgeon: Evans Lance, MD;  Location: [place];  Service: Cardiovascular;  Laterality: N/A;   CARDIOVERSION N/A 04/13/2017   Procedure: CARDIOVERSION;  Surgeon: Sueanne Margarita, MD;  Location: [facility] ENDOSCOPY;  Service: Cardiovascular;  Laterality: N/A;   COLONOSCOPY     CORONARY STENT INTERVENTION N/A 10/15/2019   Procedure: CORONARY STENT INTERVENTION;  Surgeon: Jettie Booze, MD;  Location: [place] CV LAB;  Service: Cardiovascular;  Laterality: N/A;   EP IMPLANTABLE DEVICE N/A 06/16/2015   Procedure: Pacemaker Implant;  Surgeon: Evans Lance, MD;  Location: [place] CV LAB;  Service: Cardiovascular;  Laterality: N/A;   hemrrhoidectomy     LEFT AND RIGHT HEART CATHETERIZATION WITH CORONARY ANGIOGRAM Bilateral 02/01/2011   Procedure: LEFT AND RIGHT HEART CATHETERIZATION WITH CORONARY ANGIOGRAM;  Surgeon: Hillary Bow, MD;  Location: [facility] CATH LAB;  Service: Cardiovascular;  Laterality: Bilateral;   LEFT HEART CATH AND CORONARY ANGIOGRAPHY N/A 10/15/2019   Procedure: LEFT HEART CATH AND CORONARY ANGIOGRAPHY;  Surgeon: Jettie Booze, MD;  Location: [place] [facility] LAB;  Service: Cardiovascular;  Laterality: N/A;   LEFT HEART CATH AND CORONARY ANGIOGRAPHY N/A 10/07/2020   Procedure: LEFT HEART CATH AND CORONARY ANGIOGRAPHY;  Surgeon: Sherren Mocha, MD;  Location: [place] CV LAB;  Service: Cardiovascular;  Laterality: N/A;   LUMBAR LAMINECTOMY/DECOMPRESSION MICRODISCECTOMY Right 02/23/2021   Procedure: Right Lumbar four-five microdiscectomy;  Surgeon: Eustace Moore, MD;  Location: [place] OR;  Service: Neurosurgery;  Laterality: Right;   MAZE  03/15/2011   Procedure: MAZE;  Surgeon: Gaye Pollack, MD;  Location: [place];  Service: Open Heart Surgery;  Laterality: N/A;   PACEMAKER INSERTION  1991   Guidant PPM, most recent Generator Change by Dr Olevia Perches was 08/22/06   RIGHT/LEFT HEART CATH  AND CORONARY ANGIOGRAPHY N/A 07/06/2016   Procedure: Right/Left Heart Cath and Coronary Angiography;  Surgeon: Sherren Mocha, MD;  Location: [place] CV LAB;  Service: Cardiovascular;  Laterality: N/A;   TEE WITHOUT CARDIOVERSION  04/15/2011   Procedure: TRANSESOPHAGEAL ECHOCARDIOGRAM (TEE);  Surgeon: Loralie Champagne, MD;  Location: [place];  Service: Cardiovascular;  Laterality: N/A;   TEE WITHOUT CARDIOVERSION N/A 09/11/2014   Procedure: TRANSESOPHAGEAL ECHOCARDIOGRAM (TEE);  Surgeon: Sueanne Margarita, MD;  Location: [facility] ENDOSCOPY;  Service: Cardiovascular;  Laterality: N/A;   Patient Active Problem List   Diagnosis Date Noted   Pure hypercholesterolemia 09/08/2020   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[place]) 03/54/6568   Chronic systolic heart failure ([place]) 02/27/2020   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   Urinary retention with incomplete bladder emptying 12/05/2019   Physical deconditioning 12/05/2019   Other hemorrhoids 12/05/2019   Adjustment reaction with anxiety 12/05/2019   A-fib ([place]) 10/14/2019   Atrial fibrillation with rapid ventricular response ([place]) 10/13/2019   NSTEMI (non-ST elevated myocardial infarction) ([place]) 10/13/2019   Acute diarrhea 10/13/2019   Normocytic anemia 10/13/2019   Diverticulitis sigmoid colon recurrent    Prediabetes 09/21/2017   Persistent atrial fibrillation ([place]) 04/11/2017   Vitamin D deficiency 05/20/2016   History of pacemaker 05/20/2016   History of CVA (cerebrovascular accident) 05/20/2016   Primary osteoarthritis of both hands 03/10/2016   DDD cervical spine 03/10/2016   Osteoarthritis of lumbar spine 03/10/2016   Chronic left shoulder pain 03/10/2016   Trochanteric bursitis of right hip 03/10/2016   Other fatigue 03/10/2016   Claudication in peripheral vascular disease ([place]) 03/10/2016   Chronic pain syndrome 03/10/2016   Ocular myasthenia gravis ([place]) 10/28/2015   Typical atrial flutter (HCC)    PVC's  (premature ventricular contractions) 01/30/2015   Pacemaker 04/30/2013   IgM monoclonal gammopathy of uncertain significance 02/17/2013   Orthostatic hypotension 04/11/2011   Long term current use of anticoagulant therapy 03/24/2011   S/P AVR (aortic valve replacement) 03/24/2011   Pleural effusion 03/24/2011   Hypothyroidism 11/22/2010   Atrial flutter ([place]) 08/19/2010   Syndrome X (cardiac) ([place]) 04/06/2010   Mixed hyperlipidemia 12/08/2007   PRIMARY HYPERCOAGULABLE STATE 12/08/2007   Coronary artery disease with exertional angina ([place]) 12/08/2007   Coronary artery disease of native artery of native heart with stable angina pectoris ([place]) 12/08/2007   AORTIC STENOSIS/ INSUFFICIENCY, NON-RHEUMATIC 12/08/2007   Atrial fibrillation ([place]) 12/08/2007   Chronotropic incompetence with sinus node dysfunction ([place]) 12/08/2007    PCP: [name], MD  REFERRING PROVIDER: Sherley Bounds  REFERRING DIAG: S/P lumbar microdiscectomy   THERAPY DIAG:  Muscle weakness (generalized)  Other abnormalities of gait and mobility  Acute bilateral low back pain without sciatica  ONSET DATE: 02/23/21  SUBJECTIVE:  SUBJECTIVE STATEMENT: Pt drove to appt today independently, feeling pretty good. States soreness/tightness in low back. Feels most limited by "weakness".  PERTINENT HISTORY:  Afib, Chronic Heart Failure , Orthostatic Hypotension, OA (shoulders, hands, hips), Pacemaker,   PAIN:  Are you having pain? Yes NPRS scale: 5/10 Pain location: Low back  Pain orientation: Right  PAIN TYPE: aching Pain description: constant  Aggravating factors: standing, walking Relieving factors: sitting, resting.   PRECAUTIONS: Back and ICD/Pacemaker  WEIGHT BEARING RESTRICTIONS No  FALLS:  Has patient fallen in last  6 months? No, Number of falls: 0  LIVING ENVIRONMENT: Lives with: lives with their spouse Lives in: House/apartment Stairs: No;  Has following equipment at home: Single point cane   PLOF: Independent  PATIENT GOALS  general mobility, strengthening, increased ability for walking, endurance, feels limited by A-Fib.    OBJECTIVE:   COGNITION:  Overall cognitive status: Within functional limits for tasks assessed      PALPATION: Fully healed incision at lumbar spine  LUMBARAROM/PROM  A/PROM A/PROM  03/19/23  Flexion Mod deficit   Extension Mod deficit  Right lateral flexion Mild/mod deficit   Left lateral flexion Mild/mod deficit   Right rotation   Left rotation    (Blank rows = not tested)  LE AROM/PROM:  Hips: mild deficits for flex and rotation. Knees: WFL   LE MMT:  Hips: 4/5;  Knee: 4+/5     Todays Treatment: 03/25/21: Aerobic: Supine:  Seated: sit to stand x 10; LAQ 3 lb x 20 bil;  Standing: Rows GTB x 20;  Marching  Stretches:  light SKTC with towel 30 sec x 2 bil; gentile pelvic tilts x 10; Seated HS stretch 30 sec x 3 bil;  Educated on HEP for shoulder OA, aarom for flexion and ER in supine, pulleys x 15 flexion;   Neuromuscular Re-education: Manual Therapy: Therapeutic Activity: Self Care: Trigger Point Dry Needling:  Modalities:   03/18/21: Supine: hooklying ball squeeze and hip abd Iso Blue TB x 20 each with TA;  TA x 10; practice for supine to sit/ log roll transfer x 2;  Seated: sit to stand x 10;  Standing:  Stretches:  light SKTC with towel 30 sec x 2 bil; gentile pelvic tilts x 10; Seated HS stretch 30 sec x 3 bil;  Educated on HEP for shoulder OA, aarom for flexion and ER in supine, pulleys x 15 flexion;     PATIENT EDUCATION:  Education details: Updated and reviewed HEP,  Person educated: Patient Education method: Explanation and Verbal cues Education comprehension: verbalized understanding, returned demonstration, verbal cues  required, and needs further education  HOME EXERCISE PROGRAM:  Access Code: [email or web address]    ASSESSMENT:  CLINICAL IMPRESSION: 03/25/21:  ***   Objective impairments include decreased activity tolerance, decreased balance, decreased coordination, decreased endurance, decreased mobility, difficulty walking, decreased ROM, decreased strength, decreased safety awareness, dizziness, increased muscle spasms, impaired flexibility, improper body mechanics, and pain. These impairments are limiting patient from cleaning, community activity, driving, and shopping. Personal factors including : cardiac, are also affecting patient's functional outcome. Patient will benefit from skilled PT to address above impairments and improve overall function.  REHAB POTENTIAL: Fair   CLINICAL DECISION MAKING: Stable/uncomplicated  EVALUATION COMPLEXITY: Low   GOALS: Goals reviewed with patient? Yes  SHORT TERM GOALS:  STG Name Target Date Goal status  1 Patient will be independent with initial HEP   04/01/21 INITIAL  2 Pt to demo independence with log roll transfer to protect back.  04/01/21 INITIAL         LONG TERM GOALS:   LTG Name Target Date Goal status  1 Patient will be independent with final HEP  04/29/21 INITIAL  2 Pt to report decreased pain in back and LEs to 0-2/10 with walking and activity.   04/29/21 INITIAL  3 Pt to demonstrate improved strength of bil LEs to be at least 4+/5, to improve ability for gait and stairs   04/29/21 INITIAL  4 Pt to demo improved score on TUG (TBD)   04/29/21 INITIAL  5 Pt to demo improved score on 6 min walk test (TBD)   04/29/21 INITIAL     PLAN: PT FREQUENCY: 1-2x/week  PT DURATION: 6 weeks  PLANNED INTERVENTIONS: Therapeutic exercises, Therapeutic activity, Neuro Muscular re-education, Balance training, Gait training, Patient/Family education, Joint mobilization, Stair training, Vestibular training, Visual/preceptual remediation/compensation, DME instructions,  Dry Needling, Spinal mobilization, Cryotherapy, Moist heat, Taping, Traction, and Manual therapy  PLAN FOR NEXT SESSION:   Lyndee Hensen, PT, DPT 1:34 PM  03/25/21

## 2021-03-25 NOTE — Telephone Encounter (Signed)
Sent new prescription to pharmacy.  Sent mychart message advising of change.

## 2021-03-25 NOTE — Progress Notes (Signed)
Location: [place] of Service:  Clinic (12)  Provider:   Code Status:  Goals of Care:  Advanced Directives 03/22/2021  Does Patient Have a Medical Advance Directive? No  Type of Advance Directive -  Does patient want to make changes to medical advance directive? -  Copy of [place] in Chart? -  Would patient like information on creating a medical advance directive? No - Patient declined  Pre-existing out of facility DNR order (yellow form or pink MOST form) -     Chief Complaint  Patient presents with   Medical Management of Chronic Issues    Patient returns to the clinic to follow up after surgery   Quality Metric Gaps    Pneumonia Vaccine 26+ Years old (1 - PCV)  TETANUS/TDAP (Every 10 Years) Zoster Vaccines- Shingrix (1 of 2) COVID-19 Vaccine Patient prefers to discuss at another time.     HPI: Patient is a 87 y.o. male seen today for an acute visit for Follow up for Pain Management  Has h/o CAD, s/p Stent Distal RCA Stent Cath in 09/2019 Repeat Cath Done in 08/22 for Anginal Symptoms. Stents were open   Follows very Closely with Cardiology  H/o Atrial Flutter on Eliquis Due to high runs of A Flutter Digoxin was added  Sinus Node Dysfunction s/p PPM Orthostatic Hypotension  Severe OsteoArthritis on Norco Prn by Rheumatology and Prednisone PRN  Adrenal Insufficiency per Dr Renne Crigler due to Prolong Steroid use for Osteoarthritis  Takes Prednisone 10 mg QD MGUS Follows with Dr Benay Spice for her Labs BPH   Chronic Fatigue and Weakness  Underwent R L4-5 hemilaminectomy with diskectomy on 12/26 for severe back pain radiating to right lower extremity  Patient has recovered well from surgery is walking with no assist.  Wife helps with housekeeping.  He is staying independent with his ADLs.  Did have a nurse today with him who is now helping him manage his medications till he fully recovers. Also was seen by Dr. Lovena Le who  reduced his metoprolol to 20 5 in the morning and 50 in the evening to see if it helps with his fatigue He has not made the change yet.  Continues to have issues with fatigue dizziness and tiredness  Pain seems controlled on Norco 3 times a day Lyrica was discontinued due to dyskinesia.  Not on any Flexeril Using MiraLAX and Peri-Colace for bowels Continues to get up many times at night for nocturia   Past Medical History:  Diagnosis Date   Aortic stenosis    moderate aortic stenosis   Arthritis    Benign prostatic hypertrophy    Chronic systolic heart failure (HCC)    Chronotropic incompetence with sinus node dysfunction (HCC)    Status post Guidant dual-mode, dual-pacing, dual-sensing  pacemaker   implantation now programmed to AAI with recent generator change.   Coronary artery disease    status post multiple prior percutaneous coronary interventions, microvascular angina per Dr Olevia Perches   Diverticulitis sigmoid colon recurrent    Dysfunctional autonomic nervous system    Dyspnea    Heart murmur    History of primary hypertension    Hypercoagulable state ([place])    chronically anticoagulated with coumadin   Hyperlipidemia    Hyperthyroidism    Hypothyroidism    Dr. Elyse Hsu   MGUS (monoclonal gammopathy of unknown significance) 02/17/2013   Ocular myasthenia (HCC)    Osteoarthritis    Paroxysmal atrial fibrillation ([place])  DR. Lia Foyer,    Prediabetes 09/21/2017   Stroke ([place])    1990    Past Surgical History:  Procedure Laterality Date   AORTIC VALVE REPLACEMENT  03/15/2011   Procedure: AORTIC VALVE REPLACEMENT (AVR);  Surgeon: Gaye Pollack, MD;  Location: [place];  Service: Open Heart Surgery;  Laterality: N/A;   APPENDECTOMY     CARDIAC CATHETERIZATION     11   CARDIOVERSION     CARDIOVERSION  04/15/2011   Procedure: CARDIOVERSION;  Surgeon: Loralie Champagne, MD;  Location: [place];  Service: Cardiovascular;  Laterality: N/A;   CARDIOVERSION N/A 09/11/2014    Procedure: CARDIOVERSION;  Surgeon: Sueanne Margarita, MD;  Location: [facility] ENDOSCOPY;  Service: Cardiovascular;  Laterality: N/A;   CARDIOVERSION N/A 06/27/2015   Procedure: CARDIOVERSION;  Surgeon: Thayer Headings, MD;  Location: [place];  Service: Cardiovascular;  Laterality: N/A;   CARDIOVERSION N/A 07/04/2015   Procedure: CARDIOVERSION;  Surgeon: Evans Lance, MD;  Location: [place];  Service: Cardiovascular;  Laterality: N/A;   CARDIOVERSION N/A 04/13/2017   Procedure: CARDIOVERSION;  Surgeon: Sueanne Margarita, MD;  [facility] ENDOSCOPY;  Service: Cardiovascular;  Laterality: N/A;   COLONOSCOPY     CORONARY STENT INTERVENTION N/A 10/15/2019   Procedure: CORONARY STENT INTERVENTION;  Surgeon: Jettie Booze, MD;  Location: [place] CV LAB;  Service: Cardiovascular;  Laterality: N/A;   EP IMPLANTABLE DEVICE N/A 06/16/2015   Procedure: Pacemaker Implant;  Surgeon: Evans Lance, MD;  Location: [place] CV LAB;  Service: Cardiovascular;  Laterality: N/A;   hemrrhoidectomy     LEFT AND RIGHT HEART CATHETERIZATION WITH CORONARY ANGIOGRAM Bilateral 02/01/2011   Procedure: LEFT AND RIGHT HEART CATHETERIZATION WITH CORONARY ANGIOGRAM;  Surgeon: Hillary Bow, MD;  [facility] LAB;  Service: Cardiovascular;  Laterality: Bilateral;   LEFT HEART CATH AND CORONARY ANGIOGRAPHY N/A 10/15/2019   Procedure: LEFT HEART CATH AND CORONARY ANGIOGRAPHY;  Surgeon: Jettie Booze, MD;  Location: [place] CV LAB;  Service: Cardiovascular;  Laterality: N/A;   LEFT HEART CATH AND CORONARY ANGIOGRAPHY N/A 10/07/2020   Procedure: LEFT HEART CATH AND CORONARY ANGIOGRAPHY;  Surgeon: Sherren Mocha, MD;  Location: [place] CV LAB;  Service: Cardiovascular;  Laterality: N/A;   LUMBAR LAMINECTOMY/DECOMPRESSION MICRODISCECTOMY Right 02/23/2021   Procedure: Right Lumbar four-five microdiscectomy;  Surgeon: Eustace Moore, MD;  Location: [place];  Service: Neurosurgery;  Laterality: Right;   MAZE   03/15/2011   Procedure: MAZE;  Surgeon: Gaye Pollack, MD;  Location: [place];  Service: Open Heart Surgery;  Laterality: N/A;   PACEMAKER INSERTION  1991   Guidant PPM, most recent Generator Change by Dr Olevia Perches was 08/22/06   RIGHT/LEFT HEART CATH AND CORONARY ANGIOGRAPHY N/A 07/06/2016   Procedure: Right/Left Heart Cath and Coronary Angiography;  Surgeon: Sherren Mocha, MD;  Location: [place] CV LAB;  Service: Cardiovascular;  Laterality: N/A;   TEE WITHOUT CARDIOVERSION  04/15/2011   Procedure: TRANSESOPHAGEAL ECHOCARDIOGRAM (TEE);  Surgeon: Loralie Champagne, MD;  Location: [facility];  Service: Cardiovascular;  Laterality: N/A;   TEE WITHOUT CARDIOVERSION N/A 09/11/2014   Procedure: TRANSESOPHAGEAL ECHOCARDIOGRAM (TEE);  Surgeon: Sueanne Margarita, MD;  Location: [facility] ENDOSCOPY;  Service: Cardiovascular;  Laterality: N/A;    Allergies  Allergen Reactions   Contrast Media [Iodinated Contrast Media] Hives   Gadolinium Derivatives Hives   Metrizamide Hives    Outpatient Encounter Medications as of 03/25/2021  Medication Sig   apixaban (ELIQUIS) 5 MG TABS tablet TAKE  1 TABLET BY MOUTH 2 TIMES DAILY.   bisacodyl (DULCOLAX) 5 MG EC tablet Take 5 mg by mouth daily as needed.   Cholecalciferol (VITAMIN D3) 2000 units capsule Take 2,000 Units by mouth daily.   Coenzyme Q10 200 MG TABS Take 200 mg by mouth daily.   desoximetasone (TOPICORT) 0.25 % cream Apply 1 application topically 2 (two) times daily.   diclofenac Sodium (VOLTAREN) 1 % GEL Apply 2 g topically 2 (two) times daily as needed (knee pain).   digoxin (LANOXIN) 0.125 MG tablet Take 1 tablet (0.125 mg total) by mouth daily.   diltiazem (CARDIZEM) 30 MG tablet Take 30 mg by mouth as needed (heart rate).   doxazosin (CARDURA) 4 MG tablet Take 1 tablet by mouth daily   ezetimibe (ZETIA) 10 MG tablet Take 1 tablet (10 mg total) by mouth daily.   HYDROcodone-acetaminophen (NORCO) 5-325 MG tablet Take 1 tablet by mouth 3 (three) times daily.    LORazepam (ATIVAN) 1 MG tablet TAKE 1 TABLET BY MOUTH AT BEDTIME. MAY ALSO TAKE 1/2 TABLET DAILY AS NEEDED FOR ANXIETY.   magnesium oxide (MAG-OX) 400 MG tablet Take 400 mg by mouth daily.   metoprolol succinate (TOPROL-XL) 50 MG 24 hr tablet Take 1/2 tablet (25 mg) in the AM and 1 tablet (50 mg) in the PM.   mometasone (ELOCON) 0.1 % cream Apply 1 application topically daily.   Multiple Vitamins-Minerals (MULTIVITAMIN WITH MINERALS) tablet Take 1 tablet by mouth daily.   nitroGLYCERIN (NITROSTAT) 0.4 MG SL tablet PLACE 1 TABLET UNDER THE TONGUE EVERY 5 MINUTES AS NEEDED FOR CHEST PAIN.   OVER THE COUNTER MEDICATION Apply 1 patch topically daily as needed (neck and shoulder pain). Chinese pain patch   pantoprazole (PROTONIX) 40 MG tablet TAKE 1 TABLET BY MOUTH DAILY.   polyethylene glycol (MIRALAX / GLYCOLAX) 17 g packet Take 17 g by mouth daily as needed for mild constipation.    potassium chloride SA (KLOR-CON M) 20 MEQ tablet TAKE 1 TABLET BY MOUTH DAILY.   predniSONE (DELTASONE) 10 MG tablet Take 10 mg by mouth daily with breakfast.   rosuvastatin (CRESTOR) 20 MG tablet TAKE 1 TABLET (20 MG TOTAL) BY MOUTH AT BEDTIME.   senna-docusate (SENOKOT-S) 8.6-50 MG tablet Take 1.5 tablets by mouth 2 (two) times daily.   [DISCONTINUED] metoprolol succinate (TOPROL-XL) 50 MG 24 hr tablet Take 1 tablet (50 mg total) by mouth 2 times daily. Take with or immediately following a meal.   [DISCONTINUED] predniSONE (DELTASONE) 10 MG tablet Take 1 tablet (10 mg total) by mouth daily with breakfast. (Patient taking differently: Take 10 mg by mouth daily with breakfast. 5 mg)   [DISCONTINUED] predniSONE (DELTASONE) 50 MG tablet 1 p.o. daily x4 (Patient taking differently: Take 50 mg by mouth. 1 p.o. daily x4)   [DISCONTINUED] pregabalin (LYRICA) 75 MG capsule Take 75 mg by mouth 2 (two) times daily.   [DISCONTINUED] tiZANidine (ZANAFLEX) 2 MG tablet Take 1 tablet by mouth 2 times daily.   No  facility-administered encounter medications on file as of 03/25/2021.    Review of Systems:  Review of Systems  Constitutional:  Positive for activity change. Negative for appetite change and unexpected weight change.  HENT: Negative.    Respiratory:  Positive for chest tightness. Negative for cough and shortness of breath.   Cardiovascular:  Negative for leg swelling.  Gastrointestinal:  Positive for constipation.  Genitourinary:  Positive for frequency.  Musculoskeletal:  Positive for arthralgias, back pain and myalgias. Negative for  gait problem.  Skin: Negative.  Negative for rash.  Neurological:  Positive for dizziness. Negative for weakness.  Psychiatric/Behavioral:  Negative for confusion and sleep disturbance.   All other systems reviewed and are negative.  Health Maintenance  Topic Date Due   Pneumonia Vaccine 24+ Years old (1 - PCV) Never done   TETANUS/TDAP  Never done   Zoster Vaccines- Shingrix (1 of 2) Never done   COVID-19 Vaccine (4 - Booster for Pfizer series) 12/27/2019   INFLUENZA VACCINE  Completed   HPV VACCINES  Aged Out    Physical Exam: Vitals:   03/25/21 1049  BP: 122/70  Pulse: 73  Temp: (!) 97.3 F (36.3 C)  SpO2: 93%  Weight: 226 lb (102.5 kg)  Height: 6\' 1"  (1.854 m)   Body mass index is 29.82 kg/m. Physical Exam Vitals reviewed.  Constitutional:      Appearance: Normal appearance.  HENT:     Head: Normocephalic.     Mouth/Throat:     Mouth: Mucous membranes are moist.     Pharynx: Oropharynx is clear.  Eyes:     Pupils: Pupils are equal, round, and reactive to light.  Cardiovascular:     Rate and Rhythm: Normal rate and regular rhythm.     Pulses: Normal pulses.     Heart sounds: No murmur heard. Pulmonary:     Effort: Pulmonary effort is normal. No respiratory distress.     Breath sounds: Normal breath sounds. No rales.  Abdominal:     General: Abdomen is flat. Bowel sounds are normal.     Palpations: Abdomen is soft.   Musculoskeletal:     Cervical back: Neck supple.     Comments: Mild edema Bilateral  Skin:    General: Skin is warm.  Neurological:     General: No focal deficit present.     Mental Status: He is alert and oriented to person, place, and time.  Psychiatric:        Mood and Affect: Mood normal.        Thought Content: Thought content normal.    Labs reviewed: Basic Metabolic Panel: Recent Labs    08/26/20 1234 09/30/20 1657 02/23/21 1341  NA 138 139 138  K 4.3 4.3 4.4  CL 102 98 101  CO2 29 23 29   GLUCOSE 102* 103* 166*  BUN 13 18 18   CREATININE 0.87 0.97 0.93  CALCIUM 9.5 9.4 9.2   Liver Function Tests: Recent Labs    08/26/20 1234  AST 22  ALT 19  ALKPHOS 93  BILITOT 0.5  PROT 7.7  ALBUMIN 4.3   No results for input(s): LIPASE, AMYLASE in the last 8760 hours. No results for input(s): AMMONIA in the last 8760 hours. CBC: Recent Labs    08/26/20 1234 09/30/20 1657 10/07/20 0948 02/23/21 1341  WBC 7.0 6.5 7.4 14.0*  NEUTROABS 5.9  --   --   --   HGB 11.7* 11.5* 12.1* 12.4*  HCT 36.9* 34.9* 37.3* 40.8  MCV 91.3 87 91.0 91.5  PLT 203 192 208 219   Lipid Panel: Recent Labs    08/26/20 1234  CHOL 137  HDL 75  LDLCALC 49  TRIG 66  CHOLHDL 1.8   Lab Results  Component Value Date   HGBA1C 6.2 (H) 10/16/2019    Procedures since last visit: DG Lumbar Spine 1 View  Result Date: 02/23/2021 CLINICAL DATA:  Micro discectomy EXAM: LUMBAR SPINE - 1 VIEW COMPARISON:  CT 02/17/2021 FINDINGS: Single intraoperative cross-table  lateral view of the lumbar spine. Linear localizing instrument overlies the posterior arch at the level of L5-S1. IMPRESSION: Negative. Electronically Signed   By: Donavan Foil M.D.   On: 02/23/2021 17:15    Assessment/Plan 1. S/P laminectomy Doing well working with therapy Pain better since surgery Follow up with Neurosurgery  2. Other osteoarthritis involving multiple joints On Prednisone 10 mg Also on Renewed Norco TID Per  his nurse he is not able to move otr do any of his ADLS without pain We talked about [facility] for Management He is going to think about it  3. Coronary artery disease of native artery of native heart with stable angina pectoris ([place]) Continues to have issue with Angina Nitro makes BP drop Not able to do much  S/p Multiple Caths Closely with Cardiologists On Statin   4. Atrial flutter, unspecified type (HCC) On Digoxin  Also on Eliquis Metoprolol dose Reduced He has not made the change yet Follows with Dr Lovena Le Has failed Amiodarone and Tikosyn before Uses Cardizem PRN  5. Hyperlipidemia, unspecified hyperlipidemia type On Statin and Zetia  6. Chronic HFrEF (heart failure with reduced ejection fraction) (HCC) Euvolemic EF 45-50% No ARB due to Low BP 7. Physical deconditioning Trying to work with therapy  8. Gastroesophageal reflux disease without esophagitis On Protonix  9. Chronic fatigue Not tried Duloxetine yet  10. Benign prostatic hyperplasia with lower urinary tract symptoms, symptom details unspecified Cardura Says doesn't help him that much  11. Drug-induced constipation Miralax and Pericolasce working well    Labs/tests ordered:  * No order type specified * Next appt:  07/01/2021

## 2021-03-25 NOTE — Telephone Encounter (Signed)
-----   Message from Evans Lance, MD sent at 03/23/2021  9:35 PM EST ----- His digoxin level is good. Low normal. Ask him to decreased the dose of toprol to 25 mg in the morning and 50 mg in the evening.

## 2021-03-25 NOTE — Therapy (Signed)
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   PT End of Session - 03/25/21 1331     Visit Number 3    Number of Visits 12    Date for PT Re-Evaluation 04/29/21    Authorization Type Medicare    PT Start Time 0932    PT Stop Time 1010    PT Time Calculation (min) 38 min    Activity Tolerance Patient tolerated treatment well    Behavior During Therapy WFL for tasks assessed/performed              Past Medical History:  Diagnosis Date   Aortic stenosis    moderate aortic stenosis   Arthritis    Benign prostatic hypertrophy    Chronic systolic heart failure (HCC)    Chronotropic incompetence with sinus node dysfunction (HCC)    Status post Guidant dual-mode, dual-pacing, dual-sensing  pacemaker   implantation now programmed to AAI with recent generator change.   Coronary artery disease    status post multiple prior percutaneous coronary interventions, microvascular angina per Dr Olevia Perches   Diverticulitis sigmoid colon recurrent    Dysfunctional autonomic nervous system    Dyspnea    Heart murmur    History of primary hypertension    Hypercoagulable state ([place])    chronically anticoagulated with coumadin   Hyperlipidemia    Hyperthyroidism    Hypothyroidism    Dr. 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 Lia Foyer,    Prediabetes 09/21/2017   Stroke ([place])    1990   Past Surgical History:  Procedure Laterality Date   AORTIC VALVE REPLACEMENT  03/15/2011   Procedure: AORTIC VALVE REPLACEMENT (AVR);  Surgeon: Gaye Pollack, MD;  Location: [facility];  Service: Open Heart Surgery;  Laterality: N/A;   APPENDECTOMY     CARDIAC CATHETERIZATION     11   CARDIOVERSION     CARDIOVERSION  04/15/2011   Procedure: CARDIOVERSION;  Surgeon: Loralie Champagne, MD;  Location: [facility];  Service: Cardiovascular;  Laterality: N/A;   CARDIOVERSION N/A 09/11/2014   Procedure: CARDIOVERSION;  Surgeon: Sueanne Margarita, MD;  Location: [facility] ENDOSCOPY;  Service: Cardiovascular;  Laterality: N/A;   CARDIOVERSION N/A 06/27/2015   Procedure: CARDIOVERSION;  Surgeon: Thayer Headings, MD;  Location: [facility];  Service: Cardiovascular;  Laterality: N/A;   CARDIOVERSION N/A 07/04/2015   Procedure: CARDIOVERSION;  Surgeon: Evans Lance, MD;  Location: [place];  Service: Cardiovascular;  Laterality: N/A;   CARDIOVERSION N/A 04/13/2017   Procedure: CARDIOVERSION;  Surgeon: Sueanne Margarita, MD;  Location[facility] ENDOSCOPY;  Service: Cardiovascular;  Laterality: N/A;   COLONOSCOPY     CORONARY STENT INTERVENTION N/A 10/15/2019   Procedure: CORONARY STENT INTERVENTION;  Surgeon: Jettie Booze, MD;  Location: [place] CV LAB;  Service: Cardiovascular;  Laterality: N/A;   EP IMPLANTABLE DEVICE N/A 06/16/2015   Procedure: Pacemaker Implant;  Surgeon: Evans Lance, MD;  Location: [place] CV LAB;  Service: Cardiovascular;  Laterality: N/A;   hemrrhoidectomy     LEFT AND RIGHT HEART CATHETERIZATION WITH CORONARY ANGIOGRAM Bilateral 02/01/2011   Procedure: LEFT AND RIGHT HEART CATHETERIZATION WITH CORONARY ANGIOGRAM;  Surgeon: Hillary Bow, MD;  Location: [facility] CATH LAB;  Service: Cardiovascular;  Laterality: Bilateral;  LEFT HEART CATH AND CORONARY ANGIOGRAPHY N/A 10/15/2019   Procedure: LEFT HEART CATH AND CORONARY ANGIOGRAPHY;  Surgeon: Jettie Booze, MD;  Location: [place] CV LAB;  Service: Cardiovascular;  Laterality: N/A;   LEFT HEART CATH AND CORONARY ANGIOGRAPHY N/A 10/07/2020   Procedure: LEFT HEART CATH AND CORONARY ANGIOGRAPHY;  Surgeon: Sherren Mocha, MD;  Location: [place] CV LAB;  Service: Cardiovascular;  Laterality: N/A;   LUMBAR LAMINECTOMY/DECOMPRESSION MICRODISCECTOMY Right 02/23/2021   Procedure: Right Lumbar four-five  microdiscectomy;  Surgeon: Eustace Moore, MD;  Location: [place];  Service: Neurosurgery;  Laterality: Right;   MAZE  03/15/2011   Procedure: MAZE;  Surgeon: Gaye Pollack, MD;  Location: [place];  Service: Open Heart Surgery;  Laterality: N/A;   PACEMAKER INSERTION  1991   Guidant PPM, most recent Generator Change by Dr Olevia Perches was 08/22/06   RIGHT/LEFT HEART CATH AND CORONARY ANGIOGRAPHY N/A 07/06/2016   Procedure: Right/Left Heart Cath and Coronary Angiography;  Surgeon: Sherren Mocha, MD;  Location: [place] CV LAB;  Service: Cardiovascular;  Laterality: N/A;   TEE WITHOUT CARDIOVERSION  04/15/2011   Procedure: TRANSESOPHAGEAL ECHOCARDIOGRAM (TEE);  Surgeon: Loralie Champagne, MD;  Location: [place];  Service: Cardiovascular;  Laterality: N/A;   TEE WITHOUT CARDIOVERSION N/A 09/11/2014   Procedure: TRANSESOPHAGEAL ECHOCARDIOGRAM (TEE);  Surgeon: Sueanne Margarita, MD;  Location: [facility] ENDOSCOPY;  Service: Cardiovascular;  Laterality: N/A;   Patient Active Problem List   Diagnosis Date Noted   Pure hypercholesterolemia 09/08/2020   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[place]) 65/78/4696   Chronic systolic heart failure ([place]) 02/27/2020   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   Urinary retention with incomplete bladder emptying 12/05/2019   Physical deconditioning 12/05/2019   Other hemorrhoids 12/05/2019   Adjustment reaction with anxiety 12/05/2019   A-fib ([place]) 10/14/2019   Atrial fibrillation with rapid ventricular response ([place]) 10/13/2019   NSTEMI (non-ST elevated myocardial infarction) ([place]) 10/13/2019   Acute diarrhea 10/13/2019   Normocytic anemia 10/13/2019   Diverticulitis sigmoid colon recurrent    Prediabetes 09/21/2017   Persistent atrial fibrillation ([place]) 04/11/2017   Vitamin D deficiency 05/20/2016   History of pacemaker 05/20/2016   History of CVA (cerebrovascular accident) 05/20/2016   Primary osteoarthritis of both hands 03/10/2016   DDD cervical  spine 03/10/2016   Osteoarthritis of lumbar spine 03/10/2016   Chronic left shoulder pain 03/10/2016   Trochanteric bursitis of right hip 03/10/2016   Other fatigue 03/10/2016   Claudication in peripheral vascular disease ([place]) 03/10/2016   Chronic pain syndrome 03/10/2016   Ocular myasthenia gravis ([place]) 10/28/2015   Typical atrial flutter (HCC)    PVC's (premature ventricular contractions) 01/30/2015   Pacemaker 04/30/2013   IgM monoclonal gammopathy of uncertain significance 02/17/2013   Orthostatic hypotension 04/11/2011   Long term current use of anticoagulant therapy 03/24/2011   S/P AVR (aortic valve replacement) 03/24/2011   Pleural effusion 03/24/2011   Hypothyroidism 11/22/2010   Atrial flutter ([place]) 08/19/2010   Syndrome X (cardiac) ([place]) 04/06/2010   Mixed hyperlipidemia 12/08/2007   PRIMARY HYPERCOAGULABLE STATE 12/08/2007   Coronary artery disease with exertional angina ([place]) 12/08/2007   Coronary artery disease of native artery of native heart with stable angina pectoris ([place]) 12/08/2007   AORTIC STENOSIS/ INSUFFICIENCY, NON-RHEUMATIC 12/08/2007   Atrial fibrillation ([place]) 12/08/2007   Chronotropic incompetence with sinus node dysfunction ([place]) 12/08/2007    PCP: [name], MD  REFERRING PROVIDER: Sherley Bounds  REFERRING DIAG: S/P lumbar microdiscectomy   THERAPY DIAG:  Muscle weakness (generalized)  Other abnormalities of gait and mobility  Acute bilateral low back pain without sciatica  ONSET DATE: 02/23/21  SUBJECTIVE:                                                                                                                                                                                           SUBJECTIVE STATEMENT: Pt states feeling "weaker" today, had some angina yesterday that resolved itself without medication.   PERTINENT HISTORY:  Afib, Chronic Heart Failure , Orthostatic Hypotension, OA (shoulders, hands, hips), Pacemaker,    PAIN:  Are you having pain? Yes NPRS scale: 5/10 Pain location: Low back  Pain orientation: Right  PAIN TYPE: aching Pain description: constant  Aggravating factors: standing, walking Relieving factors: sitting, resting.   PRECAUTIONS: Back and ICD/Pacemaker  WEIGHT BEARING RESTRICTIONS No  FALLS:  Has patient fallen in last 6 months? No, Number of falls: 0  LIVING ENVIRONMENT: Lives with: lives with their spouse Lives in: House/apartment Stairs: No;  Has following equipment at home: Single point cane   PLOF: Independent  PATIENT GOALS  general mobility, strengthening, increased ability for walking, endurance, feels limited by A-Fib.    OBJECTIVE:   COGNITION:  Overall cognitive status: Within functional limits for tasks assessed      PALPATION: Fully healed incision at lumbar spine  LUMBARAROM/PROM  A/PROM A/PROM  03/18/2021  Flexion Mod deficit   Extension Mod deficit  Right lateral flexion Mild/mod deficit   Left lateral flexion Mild/mod deficit   Right rotation   Left rotation    (Blank rows = not tested)  LE AROM/PROM:  Hips: mild deficits for flex and rotation. Knees: WFL   LE MMT:  Hips: 4/5;  Knee: 4+/5     Todays Treatment:   03/25/21:   Hear rate monitored throughout session. 64-80 bpm.  Aerobic: Supine:  Seated: sit to stand x 10;  LAQ 3 lb x 20 bil;  Standing: Rows GTB x 20;  Marching x20; Toe taps on 6 in step x 20, 1 UE support.  Stretches:    Neuromuscular Re-education: Manual Therapy: Therapeutic Activity: Self Care: Trigger Point Dry Needling:  Modalities:    03/23/21: Therapeutic Exercise:  Aerobic: Supine: hooklying ball squeeze and hip abd Iso Blue TB x 20 each with TA;  TA x 10; practice for supine to sit/ log roll transfer x 2;  Seated: sit to stand x 10;  Standing:  Stretches:  light SKTC with towel 30 sec x 2 bil; gentile pelvic tilts x 10; Seated HS stretch 30 sec x 3 bil;  Educated on HEP for shoulder OA,  aarom  for flexion and ER in supine, pulleys x 15 flexion;   Neuromuscular Re-education: Manual Therapy: Therapeutic Activity: Self Care: Trigger Point Dry Needling:  Modalities:    PATIENT EDUCATION:  Education details: Education on initial HEP Person educated: Patient Education method: Explanation and Verbal cues Education comprehension: verbalized understanding, returned demonstration, verbal cues required, and needs further education  HOME EXERCISE PROGRAM:  Access Code: [email or web address]    ASSESSMENT:  CLINICAL IMPRESSION: 03/25/21: Pt educated on LE strengthening today, updated HEP. Pt did well with activities, but does require seated rest breaks after sit to stands and standing ther ex, due to weakness. Will progress to recumbent bike, as pts energy and endurance allows.    Objective impairments include decreased activity tolerance, decreased balance, decreased coordination, decreased endurance, decreased mobility, difficulty walking, decreased ROM, decreased strength, decreased safety awareness, dizziness, increased muscle spasms, impaired flexibility, improper body mechanics, and pain. These impairments are limiting patient from cleaning, community activity, driving, and shopping. Personal factors including : cardiac, are also affecting patient's functional outcome. Patient will benefit from skilled PT to address above impairments and improve overall function.  REHAB POTENTIAL: Fair   CLINICAL DECISION MAKING: Stable/uncomplicated  EVALUATION COMPLEXITY: Low   GOALS: Goals reviewed with patient? Yes  SHORT TERM GOALS:  STG Name Target Date Goal status  1 Patient will be independent with initial HEP   04/01/21 INITIAL  2 Pt to demo independence with log roll transfer to protect back.  04/01/21 INITIAL         LONG TERM GOALS:   LTG Name Target Date Goal status  1 Patient will be independent with final HEP  04/29/21 INITIAL  2 Pt to report decreased pain in back and LEs to  0-2/10 with walking and activity.   04/29/21 INITIAL  3 Pt to demonstrate improved strength of bil LEs to be at least 4+/5, to improve ability for gait and stairs   04/29/21 INITIAL  4 Pt to demo improved score on TUG (TBD)   04/29/21 INITIAL  5 Pt to demo improved score on 6 min walk test (TBD)   04/29/21 INITIAL     PLAN: PT FREQUENCY: 1-2x/week  PT DURATION: 6 weeks  PLANNED INTERVENTIONS: Therapeutic exercises, Therapeutic activity, Neuro Muscular re-education, Balance training, Gait training, Patient/Family education, Joint mobilization, Stair training, Vestibular training, Visual/preceptual remediation/compensation, DME instructions, Dry Needling, Spinal mobilization, Cryotherapy, Moist heat, Taping, Traction, and Manual therapy  PLAN FOR NEXT SESSION:   Lyndee Hensen, PT, DPT 1:37 PM  03/25/21

## 2021-03-27 ENCOUNTER — Other Ambulatory Visit (HOSPITAL_BASED_OUTPATIENT_CLINIC_OR_DEPARTMENT_OTHER): Payer: Self-pay

## 2021-03-30 ENCOUNTER — Encounter: Payer: Self-pay | Admitting: Physical Therapy

## 2021-03-30 ENCOUNTER — Ambulatory Visit (INDEPENDENT_AMBULATORY_CARE_PROVIDER_SITE_OTHER): Payer: Medicare Other | Admitting: Physical Therapy

## 2021-03-30 ENCOUNTER — Other Ambulatory Visit: Payer: Self-pay

## 2021-03-30 DIAGNOSIS — M545 Low back pain, unspecified: Secondary | ICD-10-CM

## 2021-03-30 DIAGNOSIS — R2689 Other abnormalities of gait and mobility: Secondary | ICD-10-CM | POA: Diagnosis not present

## 2021-03-30 DIAGNOSIS — M6281 Muscle weakness (generalized): Secondary | ICD-10-CM | POA: Diagnosis not present

## 2021-03-30 NOTE — Therapy (Signed)
OUTPATIENT PHYSICAL THERAPY TREATMENT    Patient Name: Kevin MARLING, MD MRN: [id number] DOB:05/02/1934, 87 y.o., male Today's Date: 03/30/2021  .   PT End of Session - 03/30/21 1316     Visit Number 4    Number of Visits 12    Date for PT Re-Evaluation 04/29/21    Authorization Type Medicare    PT Start Time 1218    PT Stop Time 1259    PT Time Calculation (min) 41 min    Activity Tolerance Patient tolerated treatment well    Behavior During Therapy WFL for tasks assessed/performed               Past Medical History:  Diagnosis Date   Aortic stenosis    moderate aortic stenosis   Arthritis    Benign prostatic hypertrophy    Chronic systolic heart failure (HCC)    Chronotropic incompetence with sinus node dysfunction (HCC)    Status post Guidant dual-mode, dual-pacing, dual-sensing  pacemaker   implantation now programmed to AAI with recent generator change.   Coronary artery disease    status post multiple prior percutaneous coronary interventions, microvascular angina per Dr Olevia Perches   Diverticulitis sigmoid colon recurrent    Dysfunctional autonomic nervous system    Dyspnea    Heart murmur    History of primary hypertension    Hypercoagulable state ([place])    chronically anticoagulated with coumadin   Hyperlipidemia    Hyperthyroidism    Hypothyroidism    Dr. 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 Lia Foyer,    Prediabetes 09/21/2017   Stroke ([place])    1990   Past Surgical History:  Procedure Laterality Date   AORTIC VALVE REPLACEMENT  03/15/2011   Procedure: AORTIC VALVE REPLACEMENT (AVR);  Surgeon: Gaye Pollack, MD;  Location: [place];  Service: Open Heart Surgery;  Laterality: N/A;   APPENDECTOMY     CARDIAC CATHETERIZATION     11   CARDIOVERSION     CARDIOVERSION  04/15/2011   Procedure: CARDIOVERSION;  Surgeon: Loralie Champagne, MD;  Location: [place];  Service: Cardiovascular;  Laterality: N/A;   CARDIOVERSION N/A 09/11/2014   Procedure: CARDIOVERSION;  Surgeon: Sueanne Margarita, MD;  Location: [facility];  Service: Cardiovascular;  Laterality: N/A;   CARDIOVERSION N/A 06/27/2015   Procedure: CARDIOVERSION;  Surgeon: Thayer Headings, MD;  Location: [place];  Service: Cardiovascular;  Laterality: N/A;   CARDIOVERSION N/A 07/04/2015   Procedure: CARDIOVERSION;  Surgeon: Evans Lance, MD;  Location: [place];  Service: Cardiovascular;  Laterality: N/A;   CARDIOVERSION N/A 04/13/2017   Procedure: CARDIOVERSION;  Surgeon: Sueanne Margarita, MD;  Location: [facility] ENDOSCOPY;  Service: Cardiovascular;  Laterality: N/A;   COLONOSCOPY     CORONARY STENT INTERVENTION N/A 10/15/2019   Procedure: CORONARY STENT INTERVENTION;  Surgeon: Jettie Booze, MD;  Location: [place] [facility];  Service: Cardiovascular;  Laterality: N/A;   EP IMPLANTABLE DEVICE N/A 06/16/2015   Procedure: Pacemaker Implant;  Surgeon: Evans Lance, MD;  Location: [place] [facility];  Service: Cardiovascular;  Laterality: N/A;   hemrrhoidectomy     LEFT AND RIGHT HEART CATHETERIZATION WITH CORONARY ANGIOGRAM Bilateral 02/01/2011   Procedure: LEFT AND RIGHT HEART CATHETERIZATION WITH CORONARY ANGIOGRAM;  Surgeon: Hillary Bow, MD;  Location: [facility] CATH LAB;  Service: Cardiovascular;  Laterality: Bilateral;  LEFT HEART CATH AND CORONARY ANGIOGRAPHY N/A 10/15/2019   Procedure: LEFT HEART CATH AND CORONARY ANGIOGRAPHY;  Surgeon: Jettie Booze, MD;  Location: [place] [facility];  Service: Cardiovascular;  Laterality: N/A;   LEFT HEART CATH AND CORONARY ANGIOGRAPHY N/A 10/07/2020   Procedure: LEFT HEART CATH AND CORONARY ANGIOGRAPHY;  Surgeon: Sherren Mocha, MD;  Location: [place] [facility];  Service: Cardiovascular;  Laterality: N/A;   LUMBAR LAMINECTOMY/DECOMPRESSION MICRODISCECTOMY Right 02/23/2021   Procedure: Right Lumbar four-five  microdiscectomy;  Surgeon: Eustace Moore, MD;  Location: [place];  Service: Neurosurgery;  Laterality: Right;   MAZE  03/15/2011   Procedure: MAZE;  Surgeon: Gaye Pollack, MD;  Location: [place];  Service: Open Heart Surgery;  Laterality: N/A;   PACEMAKER INSERTION  1991   Guidant PPM, most recent Generator Change by Dr Olevia Perches was 08/22/06   RIGHT/LEFT HEART CATH AND CORONARY ANGIOGRAPHY N/A 07/06/2016   Procedure: Right/Left Heart Cath and Coronary Angiography;  Surgeon: Sherren Mocha, MD;  Location: [place] [facility];  Service: Cardiovascular;  Laterality: N/A;   TEE WITHOUT CARDIOVERSION  04/15/2011   Procedure: TRANSESOPHAGEAL ECHOCARDIOGRAM (TEE);  Surgeon: Loralie Champagne, MD;  Location: [place];  Service: Cardiovascular;  Laterality: N/A;   TEE WITHOUT CARDIOVERSION N/A 09/11/2014   Procedure: TRANSESOPHAGEAL ECHOCARDIOGRAM (TEE);  Surgeon: Sueanne Margarita, MD;  Location: [facility] ENDOSCOPY;  Service: Cardiovascular;  Laterality: N/A;   Patient Active Problem List   Diagnosis Date Noted   Pure hypercholesterolemia 09/08/2020   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[place]) 24/40/1027   Chronic systolic heart failure ([place]) 02/27/2020   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   Urinary retention with incomplete bladder emptying 12/05/2019   Physical deconditioning 12/05/2019   Other hemorrhoids 12/05/2019   Adjustment reaction with anxiety 12/05/2019   A-fib ([place]) 10/14/2019   Atrial fibrillation with rapid ventricular response ([place]) 10/13/2019   NSTEMI (non-ST elevated myocardial infarction) ([place]) 10/13/2019   Acute diarrhea 10/13/2019   Normocytic anemia 10/13/2019   Diverticulitis sigmoid colon recurrent    Prediabetes 09/21/2017   Persistent atrial fibrillation ([place]) 04/11/2017   Vitamin D deficiency 05/20/2016   History of pacemaker 05/20/2016   History of CVA (cerebrovascular accident) 05/20/2016   Primary osteoarthritis of both hands 03/10/2016   DDD cervical  spine 03/10/2016   Osteoarthritis of lumbar spine 03/10/2016   Chronic left shoulder pain 03/10/2016   Trochanteric bursitis of right hip 03/10/2016   Other fatigue 03/10/2016   Claudication in peripheral vascular disease ([place]) 03/10/2016   Chronic pain syndrome 03/10/2016   Ocular myasthenia gravis ([place]) 10/28/2015   Typical atrial flutter (HCC)    PVC's (premature ventricular contractions) 01/30/2015   Pacemaker 04/30/2013   IgM monoclonal gammopathy of uncertain significance 02/17/2013   Orthostatic hypotension 04/11/2011   Long term current use of anticoagulant therapy 03/24/2011   S/P AVR (aortic valve replacement) 03/24/2011   Pleural effusion 03/24/2011   Hypothyroidism 11/22/2010   Atrial flutter ([place]) 08/19/2010   Syndrome X (cardiac) ([place]) 04/06/2010   Mixed hyperlipidemia 12/08/2007   PRIMARY HYPERCOAGULABLE STATE 12/08/2007   Coronary artery disease with exertional angina ([place]) 12/08/2007   Coronary artery disease of native artery of native heart with stable angina pectoris ([place]) 12/08/2007   AORTIC STENOSIS/ INSUFFICIENCY, NON-RHEUMATIC 12/08/2007   Atrial fibrillation ([place]) 12/08/2007   Chronotropic incompetence with sinus node dysfunction ([place]) 12/08/2007    PCP: [name], MD  REFERRING PROVIDER: Sherley Bounds  REFERRING DIAG: S/P lumbar microdiscectomy   THERAPY DIAG:  Muscle weakness (generalized)  Other abnormalities of gait and mobility  Acute bilateral low back pain without sciatica  ONSET DATE: 02/23/21  SUBJECTIVE:                                                                                                                                                                                           SUBJECTIVE STATEMENT: Pt states mild soreness in R low back and glute.   PERTINENT HISTORY:  Afib, Chronic Heart Failure , Orthostatic Hypotension, OA (shoulders, hands, hips), Pacemaker,   PAIN:  Are you having pain? Yes NPRS scale: 5/10 Pain  location: Low back  Pain orientation: Right  PAIN TYPE: aching Pain description: constant  Aggravating factors: standing, walking Relieving factors: sitting, resting.   PRECAUTIONS: Back and ICD/Pacemaker  WEIGHT BEARING RESTRICTIONS No  FALLS:  Has patient fallen in last 6 months? No, Number of falls: 0  LIVING ENVIRONMENT: Lives with: lives with their spouse Lives in: House/apartment Stairs: No;  Has following equipment at home: Single point cane   PLOF: Independent  PATIENT GOALS  general mobility, strengthening, increased ability for walking, endurance, feels limited by A-Fib.    OBJECTIVE:   COGNITION:  Overall cognitive status: Within functional limits for tasks assessed      PALPATION: Fully healed incision at lumbar spine  LUMBARAROM/PROM  A/PROM A/PROM  03/18/2021  Flexion Mod deficit   Extension Mod deficit  Right lateral flexion Mild/mod deficit   Left lateral flexion Mild/mod deficit   Right rotation   Left rotation    (Blank rows = not tested)  LE AROM/PROM:  Hips: mild deficits for flex and rotation. Knees: WFL   LE MMT:  Hips: 4/5;  Knee: 4+/5     Todays Treatment:  03/30/21:   Aerobic: Supine:  Seated: sit to stand  2x 5;    LAQ 5 lb x 20 bil;  Standing:  Marching x20; Heel raises x 20; Fwd /bwd stepping with weight shifts x 15 each bil (at counter) , no UE support; Stretches:   SKTC with towel 30 sec x 2 bil; modified fig 4 piriformis 30 sec x 3 bil;  Gentile pelvic tilts x 10;  Seated HS stretch 30 sec x 3 bil;   Neuromuscular Re-education: Manual Therapy:  Long leg distraction on R for lumbar traction x 2 min;  Therapeutic Activity:    03/25/21:   Hear rate monitored throughout session. 64-80 bpm.  Aerobic: Supine:  Seated: sit to stand x 10;  LAQ 3 lb x 20 bil;  Standing: Rows GTB x 20;  Marching x20; Toe taps on 6 in step x 20, 1  UE support.  Stretches:    Neuromuscular Re-education: Manual Therapy: Therapeutic  Activity: Self Care: Trigger Point Dry Needling:  Modalities:    03/23/21: Therapeutic Exercise:  Aerobic: Supine: hooklying ball squeeze and hip abd Iso Blue TB x 20 each with TA;  TA x 10; practice for supine to sit/ log roll transfer x 2;  Seated: sit to stand x 10;  Standing:  Stretches:  light SKTC with towel 30 sec x 2 bil; gentile pelvic tilts x 10; Seated HS stretch 30 sec x 3 bil;  Educated on HEP for shoulder OA, aarom for flexion and ER in supine, pulleys x 15 flexion;   Neuromuscular Re-education: Manual Therapy: Therapeutic Activity: Self Care: Trigger Point Dry Needling:  Modalities:    PATIENT EDUCATION:  Education details: reviewed HEP and importance,  Person educated: Patient Education method: Explanation and Verbal cues Education comprehension: verbalized understanding, returned demonstration, verbal cues required, and needs further education   HOME EXERCISE PROGRAM:  Access Code: [email or web address]    ASSESSMENT:  CLINICAL IMPRESSION: 03/30/21:  Pt with good ability for exercises today, does tire very easily, and require seated rest breaks. Challenged with stepping/weight shifts today, pt to benefit from continued LE strength and stability, for improved stability and safety with gait, as well as mobility and strengthening for back/hips for decreased pain. Updated HEP, and discussed performing more often for back pain and decompression.     Objective impairments include decreased activity tolerance, decreased balance, decreased coordination, decreased endurance, decreased mobility, difficulty walking, decreased ROM, decreased strength, decreased safety awareness, dizziness, increased muscle spasms, impaired flexibility, improper body mechanics, and pain. These impairments are limiting patient from cleaning, community activity, driving, and shopping. Personal factors including : cardiac, are also affecting patient's functional outcome. Patient will benefit from  skilled PT to address above impairments and improve overall function.  REHAB POTENTIAL: Fair   CLINICAL DECISION MAKING: Stable/uncomplicated  EVALUATION COMPLEXITY: Low   GOALS: Goals reviewed with patient? Yes  SHORT TERM GOALS:  STG Name Target Date Goal status  1 Patient will be independent with initial HEP   04/01/21 INITIAL  2 Pt to demo independence with log roll transfer to protect back.  04/01/21 INITIAL         LONG TERM GOALS:   LTG Name Target Date Goal status  1 Patient will be independent with final HEP  04/29/21 INITIAL  2 Pt to report decreased pain in back and LEs to 0-2/10 with walking and activity.   04/29/21 INITIAL  3 Pt to demonstrate improved strength of bil LEs to be at least 4+/5, to improve ability for gait and stairs   04/29/21 INITIAL  4 Pt to demo improved score on TUG (TBD)   04/29/21 INITIAL  5 Pt to demo improved score on 6 min walk test (TBD)   04/29/21 INITIAL     PLAN: PT FREQUENCY: 1-2x/week  PT DURATION: 6 weeks  PLANNED INTERVENTIONS: Therapeutic exercises, Therapeutic activity, Neuro Muscular re-education, Balance training, Gait training, Patient/Family education, Joint mobilization, Stair training, Vestibular training, Visual/preceptual remediation/compensation, DME instructions, Dry Needling, Spinal mobilization, Cryotherapy, Moist heat, Taping, Traction, and Manual therapy  PLAN FOR NEXT SESSION:   Lyndee Hensen, PT, DPT 1:16 PM  03/30/21

## 2021-04-01 ENCOUNTER — Ambulatory Visit (INDEPENDENT_AMBULATORY_CARE_PROVIDER_SITE_OTHER): Payer: Medicare Other | Admitting: Physical Therapy

## 2021-04-01 ENCOUNTER — Encounter: Payer: Self-pay | Admitting: Physical Therapy

## 2021-04-01 DIAGNOSIS — R2689 Other abnormalities of gait and mobility: Secondary | ICD-10-CM

## 2021-04-01 DIAGNOSIS — M545 Low back pain, unspecified: Secondary | ICD-10-CM

## 2021-04-01 DIAGNOSIS — M6281 Muscle weakness (generalized): Secondary | ICD-10-CM | POA: Diagnosis not present

## 2021-04-01 NOTE — Therapy (Signed)
OUTPATIENT PHYSICAL THERAPY TREATMENT    Patient Name: Kevin ARCH, MD MRN: [id number] DOB:1934-08-22, 87 y.o., male Today's Date: 04/01/2021  .   PT End of Session - 04/01/21 1310     Visit Number 5    Number of Visits 12    Date for PT Re-Evaluation 04/29/21    Authorization Type Medicare    PT Start Time 1217    PT Stop Time 1257    PT Time Calculation (min) 40 min    Activity Tolerance Patient tolerated treatment well    Behavior During Therapy WFL for tasks assessed/performed                Past Medical History:  Diagnosis Date   Aortic stenosis    moderate aortic stenosis   Arthritis    Benign prostatic hypertrophy    Chronic systolic heart failure (HCC)    Chronotropic incompetence with sinus node dysfunction (HCC)    Status post Guidant dual-mode, dual-pacing, dual-sensing  pacemaker   implantation now programmed to AAI with recent generator change.   Coronary artery disease    status post multiple prior percutaneous coronary interventions, microvascular angina per Dr Olevia Perches   Diverticulitis sigmoid colon recurrent    Dysfunctional autonomic nervous system    Dyspnea    Heart murmur    History of primary hypertension    Hypercoagulable state ([place])    chronically anticoagulated with coumadin   Hyperlipidemia    Hyperthyroidism    Hypothyroidism    Dr. 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 Lia Foyer,    Prediabetes 09/21/2017   Stroke ([place])    1990   Past Surgical History:  Procedure Laterality Date   AORTIC VALVE REPLACEMENT  03/15/2011   Procedure: AORTIC VALVE REPLACEMENT (AVR);  Surgeon: Gaye Pollack, MD;  Location: [facility];  Service: Open Heart Surgery;  Laterality: N/A;   APPENDECTOMY     CARDIAC CATHETERIZATION     11   CARDIOVERSION     CARDIOVERSION  04/15/2011   Procedure: CARDIOVERSION;  Surgeon: Loralie Champagne, MD;  Location: [facility];  Service: Cardiovascular;  Laterality: N/A;   CARDIOVERSION N/A 09/11/2014   Procedure: CARDIOVERSION;  Surgeon: Sueanne Margarita, MD;  Location: [facility] ENDOSCOPY;  Service: Cardiovascular;  Laterality: N/A;   CARDIOVERSION N/A 06/27/2015   Procedure: CARDIOVERSION;  Surgeon: Thayer Headings, MD;  Location: [facility];  Service: Cardiovascular;  Laterality: N/A;   CARDIOVERSION N/A 07/04/2015   Procedure: CARDIOVERSION;  Surgeon: Evans Lance, MD;  Location: [facility];  Service: Cardiovascular;  Laterality: N/A;   CARDIOVERSION N/A 04/13/2017   Procedure: CARDIOVERSION;  Surgeon: Sueanne Margarita, MD;  Location: [facility] ENDOSCOPY;  Service: Cardiovascular;  Laterality: N/A;   COLONOSCOPY     CORONARY STENT INTERVENTION N/A 10/15/2019   Procedure: CORONARY STENT INTERVENTION;  Surgeon: Jettie Booze, MD;  Location: [place] CV LAB;  Service: Cardiovascular;  Laterality: N/A;   EP IMPLANTABLE DEVICE N/A 06/16/2015   Procedure: Pacemaker Implant;  Surgeon: Evans Lance, MD;  Location: [place] CV LAB;  Service: Cardiovascular;  Laterality: N/A;   hemrrhoidectomy     LEFT AND RIGHT HEART CATHETERIZATION WITH CORONARY ANGIOGRAM Bilateral 02/01/2011   Procedure: LEFT AND RIGHT HEART CATHETERIZATION WITH CORONARY ANGIOGRAM;  Surgeon: Hillary Bow, MD;  Location: [facility] CATH LAB;  Service: Cardiovascular;  Laterality: Bilateral;  LEFT HEART CATH AND CORONARY ANGIOGRAPHY N/A 10/15/2019   Procedure: LEFT HEART CATH AND CORONARY ANGIOGRAPHY;  Surgeon: Jettie Booze, MD;  Location: [place] CV LAB;  Service: Cardiovascular;  Laterality: N/A;   LEFT HEART CATH AND CORONARY ANGIOGRAPHY N/A 10/07/2020   Procedure: LEFT HEART CATH AND CORONARY ANGIOGRAPHY;  Surgeon: Sherren Mocha, MD;  Location: [place] CV LAB;  Service: Cardiovascular;  Laterality: N/A;   LUMBAR LAMINECTOMY/DECOMPRESSION MICRODISCECTOMY Right 02/23/2021   Procedure: Right Lumbar four-five  microdiscectomy;  Surgeon: Eustace Moore, MD;  Location: [place];  Service: Neurosurgery;  Laterality: Right;   MAZE  03/15/2011   Procedure: MAZE;  Surgeon: Gaye Pollack, MD;  Location: [place];  Service: Open Heart Surgery;  Laterality: N/A;   PACEMAKER INSERTION  1991   Guidant PPM, most recent Generator Change by Dr Olevia Perches was 08/22/06   RIGHT/LEFT HEART CATH AND CORONARY ANGIOGRAPHY N/A 07/06/2016   Procedure: Right/Left Heart Cath and Coronary Angiography;  Surgeon: Sherren Mocha, MD;  Location: [place] CV LAB;  Service: Cardiovascular;  Laterality: N/A;   TEE WITHOUT CARDIOVERSION  04/15/2011   Procedure: TRANSESOPHAGEAL ECHOCARDIOGRAM (TEE);  Surgeon: Loralie Champagne, MD;  Location: [place];  Service: Cardiovascular;  Laterality: N/A;   TEE WITHOUT CARDIOVERSION N/A 09/11/2014   Procedure: TRANSESOPHAGEAL ECHOCARDIOGRAM (TEE);  Surgeon: Sueanne Margarita, MD;  Location[facility] ENDOSCOPY;  Service: Cardiovascular;  Laterality: N/A;   Patient Active Problem List   Diagnosis Date Noted   Pure hypercholesterolemia 09/08/2020   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[place]) 12/75/1700   Chronic systolic heart failure ([place]) 02/27/2020   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   Urinary retention with incomplete bladder emptying 12/05/2019   Physical deconditioning 12/05/2019   Other hemorrhoids 12/05/2019   Adjustment reaction with anxiety 12/05/2019   A-fib ([place]) 10/14/2019   Atrial fibrillation with rapid ventricular response ([place]) 10/13/2019   NSTEMI (non-ST elevated myocardial infarction) ([place]) 10/13/2019   Acute diarrhea 10/13/2019   Normocytic anemia 10/13/2019   Diverticulitis sigmoid colon recurrent    Prediabetes 09/21/2017   Persistent atrial fibrillation ([place]) 04/11/2017   Vitamin D deficiency 05/20/2016   History of pacemaker 05/20/2016   History of CVA (cerebrovascular accident) 05/20/2016   Primary osteoarthritis of both hands 03/10/2016   DDD cervical  spine 03/10/2016   Osteoarthritis of lumbar spine 03/10/2016   Chronic left shoulder pain 03/10/2016   Trochanteric bursitis of right hip 03/10/2016   Other fatigue 03/10/2016   Claudication in peripheral vascular disease ([place]) 03/10/2016   Chronic pain syndrome 03/10/2016   Ocular myasthenia gravis ([place]) 10/28/2015   Typical atrial flutter (HCC)    PVC's (premature ventricular contractions) 01/30/2015   Pacemaker 04/30/2013   IgM monoclonal gammopathy of uncertain significance 02/17/2013   Orthostatic hypotension 04/11/2011   Long term current use of anticoagulant therapy 03/24/2011   S/P AVR (aortic valve replacement) 03/24/2011   Pleural effusion 03/24/2011   Hypothyroidism 11/22/2010   Atrial flutter ([place]) 08/19/2010   Syndrome X (cardiac) ([place]) 04/06/2010   Mixed hyperlipidemia 12/08/2007   PRIMARY HYPERCOAGULABLE STATE 12/08/2007   Coronary artery disease with exertional angina ([place]) 12/08/2007   Coronary artery disease of native artery of native heart with stable angina pectoris ([place]) 12/08/2007   AORTIC STENOSIS/ INSUFFICIENCY, NON-RHEUMATIC 12/08/2007   Atrial fibrillation ([place]) 12/08/2007   Chronotropic incompetence with sinus node dysfunction ([place]) 12/08/2007    PCP: [name], MD  REFERRING PROVIDER: Sherley Bounds  REFERRING DIAG: S/P lumbar microdiscectomy   THERAPY DIAG:  Muscle weakness (generalized)  Other abnormalities of gait and mobility  Acute bilateral low back pain without sciatica  ONSET DATE: 02/23/21  SUBJECTIVE:                                                                                                                                                                                           SUBJECTIVE STATEMENT: Pt states pain in R glute today. Has been trying to walk more at home/ laps in the house.   PERTINENT HISTORY:  Afib, Chronic Heart Failure , Orthostatic Hypotension, OA (shoulders, hands, hips), Pacemaker,   PAIN:  Are you  having pain? Yes NPRS scale: 5/10 Pain location: Low back  Pain orientation: Right  PAIN TYPE: aching Pain description: constant  Aggravating factors: standing, walking Relieving factors: sitting, resting.   PRECAUTIONS: Back and ICD/Pacemaker  WEIGHT BEARING RESTRICTIONS No  FALLS:  Has patient fallen in last 6 months? No, Number of falls: 0  LIVING ENVIRONMENT: Lives with: lives with their spouse Lives in: House/apartment Stairs: No;  Has following equipment at home: Single point cane   PLOF: Independent  PATIENT GOALS  general mobility, strengthening, increased ability for walking, endurance, feels limited by A-Fib.    OBJECTIVE:   COGNITION:  Overall cognitive status: Within functional limits for tasks assessed    PALPATION: Fully healed incision at lumbar spine  LUMBARAROM/PROM  A/PROM A/PROM  03/18/2021  Flexion Mod deficit   Extension Mod deficit  Right lateral flexion Mild/mod deficit   Left lateral flexion Mild/mod deficit   Right rotation   Left rotation    (Blank rows = not tested)  LE AROM/PROM:  Hips: mild deficits for flex and rotation. Knees: WFL   LE MMT:  Hips: 4/5;  Knee: 4+/5     Todays Treatment: 04/01/21:  Aerobic: Supine:  Seated: sit to stand x 10 , x 8;      Standing:  Marching x20; hip abd 2x10 bil; Heel raises x 20;  Toe taps on 6 in step x 25, 1 UE support;  up/down 5 steps, 6 in, 1 UE support, x 5;  Stretches:   SKTC with towel 30 sec x 2 bil;  Pelvic tilts x 15;  Neuromuscular Re-education: Manual Therapy:  Long leg distraction on R for lumbar traction x 2 min;  TPR to R glute med with tennis ball x 6  min  Therapeutic Activity:    03/30/21:   Aerobic: Supine:  Seated: sit to stand  2x 5;    LAQ 5 lb x 20 bil;  Standing:  Marching x20; Heel raises x 20; Fwd /bwd stepping with weight shifts x  15 each bil (at counter) , no UE support; Stretches:   SKTC with towel 30 sec x 2 bil; modified fig 4 piriformis 30 sec x 3  bil;  Gentile pelvic tilts x 10;  Seated HS stretch 30 sec x 3 bil;   Neuromuscular Re-education: Manual Therapy:  Long leg distraction on R for lumbar traction x 2 min;  Therapeutic Activity:    03/25/21:   Hear rate monitored throughout session. 64-80 bpm.  Aerobic: Supine:  Seated: sit to stand x 10;  LAQ 3 lb x 20 bil;  Standing: Rows GTB x 20;  Marching x20; Toe taps on 6 in step x 20, 1 UE support.  Stretches:    Neuromuscular Re-education: Manual Therapy: Therapeutic Activity: Self Care: Trigger Point Dry Needling:  Modalities:    PATIENT EDUCATION:  Education details: reviewed HEP and importance,  Person educated: Patient Education method: Explanation and Verbal cues Education comprehension: verbalized understanding, returned demonstration, verbal cues required, and needs further education   HOME EXERCISE PROGRAM:  Access Code: [email or web address]    ASSESSMENT:  CLINICAL IMPRESSION: 04/01/21: Pt with low endurance for standing activity, does require seated rest breaks ,mostly after each exercise. He has tenderness in R glute med today, addressed with TPR, may require continuation of this for decreasing muscle tension and pain. Pt to benefit from continued LE strength, balance, and increasing capacity for standing and functional activity.    Objective impairments include decreased activity tolerance, decreased balance, decreased coordination, decreased endurance, decreased mobility, difficulty walking, decreased ROM, decreased strength, decreased safety awareness, dizziness, increased muscle spasms, impaired flexibility, improper body mechanics, and pain. These impairments are limiting patient from cleaning, community activity, driving, and shopping. Personal factors including : cardiac, are also affecting patient's functional outcome. Patient will benefit from skilled PT to address above impairments and improve overall function.  REHAB POTENTIAL: Fair   CLINICAL DECISION  MAKING: Stable/uncomplicated  EVALUATION COMPLEXITY: Low   GOALS: Goals reviewed with patient? Yes  SHORT TERM GOALS:  STG Name Target Date Goal status  1 Patient will be independent with initial HEP   04/01/21 INITIAL  2 Pt to demo independence with log roll transfer to protect back.  04/01/21 INITIAL         LONG TERM GOALS:   LTG Name Target Date Goal status  1 Patient will be independent with final HEP  04/29/21 INITIAL  2 Pt to report decreased pain in back and LEs to 0-2/10 with walking and activity.   04/29/21 INITIAL  3 Pt to demonstrate improved strength of bil LEs to be at least 4+/5, to improve ability for gait and stairs   04/29/21 INITIAL  4 Pt to demo improved score on TUG (TBD)   04/29/21 INITIAL  5 Pt to demo improved score on 6 min walk test (TBD)   04/29/21 INITIAL     PLAN: PT FREQUENCY: 1-2x/week  PT DURATION: 6 weeks  PLANNED INTERVENTIONS: Therapeutic exercises, Therapeutic activity, Neuro Muscular re-education, Balance training, Gait training, Patient/Family education, Joint mobilization, Stair training, Vestibular training, Visual/preceptual remediation/compensation, DME instructions, Dry Needling, Spinal mobilization, Cryotherapy, Moist heat, Taping, Traction, and Manual therapy  PLAN FOR NEXT SESSION:   Lyndee Hensen, PT, DPT 1:13 PM  04/01/21

## 2021-04-06 ENCOUNTER — Other Ambulatory Visit (HOSPITAL_BASED_OUTPATIENT_CLINIC_OR_DEPARTMENT_OTHER): Payer: Self-pay

## 2021-04-06 ENCOUNTER — Other Ambulatory Visit: Payer: Self-pay | Admitting: Internal Medicine

## 2021-04-06 ENCOUNTER — Other Ambulatory Visit: Payer: Self-pay

## 2021-04-06 ENCOUNTER — Encounter (HOSPITAL_BASED_OUTPATIENT_CLINIC_OR_DEPARTMENT_OTHER): Payer: Self-pay

## 2021-04-06 ENCOUNTER — Ambulatory Visit (INDEPENDENT_AMBULATORY_CARE_PROVIDER_SITE_OTHER): Payer: Medicare Other | Admitting: Physical Therapy

## 2021-04-06 ENCOUNTER — Other Ambulatory Visit: Payer: Self-pay | Admitting: Orthopedic Surgery

## 2021-04-06 ENCOUNTER — Encounter: Payer: Self-pay | Admitting: Physical Therapy

## 2021-04-06 DIAGNOSIS — I4892 Unspecified atrial flutter: Secondary | ICD-10-CM

## 2021-04-06 DIAGNOSIS — M545 Low back pain, unspecified: Secondary | ICD-10-CM

## 2021-04-06 DIAGNOSIS — M6281 Muscle weakness (generalized): Secondary | ICD-10-CM

## 2021-04-06 DIAGNOSIS — R2689 Other abnormalities of gait and mobility: Secondary | ICD-10-CM

## 2021-04-06 DIAGNOSIS — I4891 Unspecified atrial fibrillation: Secondary | ICD-10-CM

## 2021-04-06 DIAGNOSIS — Z950 Presence of cardiac pacemaker: Secondary | ICD-10-CM

## 2021-04-06 NOTE — Therapy (Signed)
OUTPATIENT PHYSICAL THERAPY TREATMENT    Patient Name: Kevin LAHAIE, MD MRN: [id number] DOB:1934-09-18, 87 y.o., male Today's Date: 04/06/2021  .   PT End of Session - 04/06/21 1311     Visit Number 6    Number of Visits 12    Date for PT Re-Evaluation 04/29/21    Authorization Type Medicare    PT Start Time 1217    PT Stop Time 1300    PT Time Calculation (min) 43 min    Activity Tolerance Patient tolerated treatment well    Behavior During Therapy WFL for tasks assessed/performed                 Past Medical History:  Diagnosis Date   Aortic stenosis    moderate aortic stenosis   Arthritis    Benign prostatic hypertrophy    Chronic systolic heart failure (HCC)    Chronotropic incompetence with sinus node dysfunction (HCC)    Status post Guidant dual-mode, dual-pacing, dual-sensing  pacemaker   implantation now programmed to AAI with recent generator change.   Coronary artery disease    status post multiple prior percutaneous coronary interventions, microvascular angina per Dr Olevia Perches   Diverticulitis sigmoid colon recurrent    Dysfunctional autonomic nervous system    Dyspnea    Heart murmur    History of primary hypertension    Hypercoagulable state ([place])    chronically anticoagulated with coumadin   Hyperlipidemia    Hyperthyroidism    Hypothyroidism    Dr. 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 Lia Foyer,    Prediabetes 09/21/2017   Stroke ([place])    1990   Past Surgical History:  Procedure Laterality Date   AORTIC VALVE REPLACEMENT  03/15/2011   Procedure: AORTIC VALVE REPLACEMENT (AVR);  Surgeon: Gaye Pollack, MD;  Location: [place];  Service: Open Heart Surgery;  Laterality: N/A;   APPENDECTOMY     CARDIAC CATHETERIZATION     11   CARDIOVERSION     CARDIOVERSION  04/15/2011   Procedure: CARDIOVERSION;  Surgeon: Loralie Champagne, MD;  Location: [place];  Service: Cardiovascular;  Laterality: N/A;   CARDIOVERSION N/A 09/11/2014   Procedure: CARDIOVERSION;  Surgeon: Sueanne Margarita, MD;  Location: [facility];  Service: Cardiovascular;  Laterality: N/A;   CARDIOVERSION N/A 06/27/2015   Procedure: CARDIOVERSION;  Surgeon: Thayer Headings, MD;  Location: [place];  Service: Cardiovascular;  Laterality: N/A;   CARDIOVERSION N/A 07/04/2015   Procedure: CARDIOVERSION;  Surgeon: Evans Lance, MD;  Location: [place];  Service: Cardiovascular;  Laterality: N/A;   CARDIOVERSION N/A 04/13/2017   Procedure: CARDIOVERSION;  Surgeon: Sueanne Margarita, MD;  Location[facility] ENDOSCOPY;  Service: Cardiovascular;  Laterality: N/A;   COLONOSCOPY     CORONARY STENT INTERVENTION N/A 10/15/2019   Procedure: CORONARY STENT INTERVENTION;  Surgeon: Jettie Booze, MD;  Location: [place] CV LAB;  Service: Cardiovascular;  Laterality: N/A;   EP IMPLANTABLE DEVICE N/A 06/16/2015   Procedure: Pacemaker Implant;  Surgeon: Evans Lance, MD;  Location: [place] CV LAB;  Service: Cardiovascular;  Laterality: N/A;   hemrrhoidectomy     LEFT AND RIGHT HEART CATHETERIZATION WITH CORONARY ANGIOGRAM Bilateral 02/01/2011   Procedure: LEFT AND RIGHT HEART CATHETERIZATION WITH CORONARY ANGIOGRAM;  Surgeon: Hillary Bow, MD;  Location: [facility] CATH LAB;  Service: Cardiovascular;  Laterality:  Bilateral;   LEFT HEART CATH AND CORONARY ANGIOGRAPHY N/A 10/15/2019   Procedure: LEFT HEART CATH AND CORONARY ANGIOGRAPHY;  Surgeon: Jettie Booze, MD;  Location: [place] CV LAB;  Service: Cardiovascular;  Laterality: N/A;   LEFT HEART CATH AND CORONARY ANGIOGRAPHY N/A 10/07/2020   Procedure: LEFT HEART CATH AND CORONARY ANGIOGRAPHY;  Surgeon: Sherren Mocha, MD;  Location: [place] CV LAB;  Service: Cardiovascular;  Laterality: N/A;   LUMBAR LAMINECTOMY/DECOMPRESSION MICRODISCECTOMY Right 02/23/2021   Procedure: Right Lumbar four-five  microdiscectomy;  Surgeon: Eustace Moore, MD;  Location: [place];  Service: Neurosurgery;  Laterality: Right;   MAZE  03/15/2011   Procedure: MAZE;  Surgeon: Gaye Pollack, MD;  Location: [place];  Service: Open Heart Surgery;  Laterality: N/A;   PACEMAKER INSERTION  1991   Guidant PPM, most recent Generator Change by Dr Olevia Perches was 08/22/06   RIGHT/LEFT HEART CATH AND CORONARY ANGIOGRAPHY N/A 07/06/2016   Procedure: Right/Left Heart Cath and Coronary Angiography;  Surgeon: Sherren Mocha, MD;  Location: [place] CV LAB;  Service: Cardiovascular;  Laterality: N/A;   TEE WITHOUT CARDIOVERSION  04/15/2011   Procedure: TRANSESOPHAGEAL ECHOCARDIOGRAM (TEE);  Surgeon: Loralie Champagne, MD;  Location: [place];  Service: Cardiovascular;  Laterality: N/A;   TEE WITHOUT CARDIOVERSION N/A 09/11/2014   Procedure: TRANSESOPHAGEAL ECHOCARDIOGRAM (TEE);  Surgeon: Sueanne Margarita, MD;  Location: [facility] ENDOSCOPY;  Service: Cardiovascular;  Laterality: N/A;   Patient Active Problem List   Diagnosis Date Noted   Pure hypercholesterolemia 09/08/2020   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[place]) 42/35/3614   Chronic systolic heart failure ([place]) 02/27/2020   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   Urinary retention with incomplete bladder emptying 12/05/2019   Physical deconditioning 12/05/2019   Other hemorrhoids 12/05/2019   Adjustment reaction with anxiety 12/05/2019   A-fib ([place]) 10/14/2019   Atrial fibrillation with rapid ventricular response ([place]) 10/13/2019   NSTEMI (non-ST elevated myocardial infarction) ([place]) 10/13/2019   Acute diarrhea 10/13/2019   Normocytic anemia 10/13/2019   Diverticulitis sigmoid colon recurrent    Prediabetes 09/21/2017   Persistent atrial fibrillation ([place]) 04/11/2017   Vitamin D deficiency 05/20/2016   History of pacemaker 05/20/2016   History of CVA (cerebrovascular accident) 05/20/2016   Primary osteoarthritis of both hands 03/10/2016   DDD cervical  spine 03/10/2016   Osteoarthritis of lumbar spine 03/10/2016   Chronic left shoulder pain 03/10/2016   Trochanteric bursitis of right hip 03/10/2016   Other fatigue 03/10/2016   Claudication in peripheral vascular disease ([place]) 03/10/2016   Chronic pain syndrome 03/10/2016   Ocular myasthenia gravis ([place]) 10/28/2015   Typical atrial flutter (HCC)    PVC's (premature ventricular contractions) 01/30/2015   Pacemaker 04/30/2013   IgM monoclonal gammopathy of uncertain significance 02/17/2013   Orthostatic hypotension 04/11/2011   Long term current use of anticoagulant therapy 03/24/2011   S/P AVR (aortic valve replacement) 03/24/2011   Pleural effusion 03/24/2011   Hypothyroidism 11/22/2010   Atrial flutter ([place]) 08/19/2010   Syndrome X (cardiac) ([place]) 04/06/2010   Mixed hyperlipidemia 12/08/2007   PRIMARY HYPERCOAGULABLE STATE 12/08/2007   Coronary artery disease with exertional angina ([place]) 12/08/2007   Coronary artery disease of native artery of native heart with stable angina pectoris ([place]) 12/08/2007   AORTIC STENOSIS/ INSUFFICIENCY, NON-RHEUMATIC 12/08/2007   Atrial fibrillation ([place]) 12/08/2007   Chronotropic incompetence with sinus node dysfunction ([place]) 12/08/2007    PCP: [name], MD  REFERRING PROVIDER: Sherley Bounds  REFERRING DIAG: S/P lumbar microdiscectomy  THERAPY DIAG:  Muscle weakness (generalized)  Other abnormalities of gait and mobility  Acute bilateral low back pain without sciatica  ONSET DATE: 02/23/21  SUBJECTIVE:                                                                                                                                                                                           SUBJECTIVE STATEMENT: Pt states he is slightly more dizzy today than he has been. Thinks he may have vertigo. Dizziness is still quite intermittent. Has been doing HEP Vitals at start of session: 02: 97, pulse 65,  BP: 158/94  PERTINENT HISTORY:   Afib, Chronic Heart Failure , Orthostatic Hypotension, OA (shoulders, hands, hips), Pacemaker,   PAIN:  Are you having pain? Yes NPRS scale: 5/10 Pain location: Low back  Pain orientation: Right  PAIN TYPE: aching Pain description: constant  Aggravating factors: standing, walking Relieving factors: sitting, resting.   PRECAUTIONS: Back and ICD/Pacemaker  WEIGHT BEARING RESTRICTIONS No  FALLS:  Has patient fallen in last 6 months? No, Number of falls: 0  LIVING ENVIRONMENT: Lives with: lives with their spouse Lives in: House/apartment Stairs: No;  Has following equipment at home: Single point cane   PLOF: Independent  PATIENT GOALS  general mobility, strengthening, increased ability for walking, endurance, feels limited by A-Fib.    OBJECTIVE:   COGNITION:  Overall cognitive status: Within functional limits for tasks assessed    PALPATION: Fully healed incision at lumbar spine  LUMBARAROM/PROM  A/PROM A/PROM  03/18/2021  Flexion Mod deficit   Extension Mod deficit  Right lateral flexion Mild/mod deficit   Left lateral flexion Mild/mod deficit   Right rotation   Left rotation    (Blank rows = not tested)  LE AROM/PROM:  Hips: mild deficits for flex and rotation. Knees: WFL   LE MMT:  Hips: 4/5;  Knee: 4+/5     Todays Treatment:  04/06/21: Aerobic: Supine:  Bridging x 10 Seated: sit to stand x 10 , S/L:  hip abd 3 x 5; Clams x 10;  Standing:  Marching x20;  Heel raises x 20;  Mini Squats x 20;  Stretches:   SKTC 30 sec x 3 bil;  Pelvic tilts x 15;  Neuromuscular Re-education: Manual Therapy:  Long leg distraction on R for lumbar traction x 2 min;  TPR to R glute med with tennis ball x 7  min  Therapeutic Activity:    04/01/21:  Aerobic: Supine:  Seated: sit to stand x 10       Standing:  Marching x20; hip abd 2x10 bil; Heel raises x 20;  Toe taps on  6 in step x 25, 1 UE support;  up/down 5 steps, 6 in, 1 UE support, x 5;  Stretches:   SKTC  with towel 30 sec x 2 bil;  Pelvic tilts x 15;  Neuromuscular Re-education: Manual Therapy:  Long leg distraction on R for lumbar traction x 2 min;  TPR to R glute med with tennis ball x 6  min  Therapeutic Activity:    03/30/21:   Aerobic: Supine:  Seated: sit to stand  2x 5;    LAQ 5 lb x 20 bil;  Standing:  Marching x20; Heel raises x 20; Fwd /bwd stepping with weight shifts x 15 each bil (at counter) , no UE support; Stretches:   SKTC with towel 30 sec x 2 bil; modified fig 4 piriformis 30 sec x 3 bil;  Gentile pelvic tilts x 10;  Seated HS stretch 30 sec x 3 bil;   Neuromuscular Re-education: Manual Therapy:  Long leg distraction on R for lumbar traction x 2 min;  Therapeutic Activity:    03/25/21:   Hear rate monitored throughout session. 64-80 bpm.  Aerobic: Supine:  Seated: sit to stand x 10;  LAQ 3 lb x 20 bil;  Standing: Rows GTB x 20;  Marching x20; Toe taps on 6 in step x 20, 1 UE support.  Stretches:    Neuromuscular Re-education: Manual Therapy: Therapeutic Activity: Self Care: Trigger Point Dry Needling:  Modalities:    PATIENT EDUCATION:  Education details: reviewed HEP and importance,  Person educated: Patient Education method: Explanation and Verbal cues Education comprehension: verbalized understanding, returned demonstration, verbal cues required, and needs further education   HOME EXERCISE PROGRAM:  Access Code: [email or web address]    ASSESSMENT:  CLINICAL IMPRESSION: 04/06/21: Pt showing improvements with ability to perform ther ex and strengthening. Still limited with endurance, but increased ease, reps and speed with ther ex. Educated on posture and arm swing with ambulation today, improved posture with cuing, with less trunk SB. He continues to have soreness in R glute/hip, will benefit from continued strengthening and manual as needed for pain, as well as progression of overall strength and conditioning.    Objective impairments include decreased  activity tolerance, decreased balance, decreased coordination, decreased endurance, decreased mobility, difficulty walking, decreased ROM, decreased strength, decreased safety awareness, dizziness, increased muscle spasms, impaired flexibility, improper body mechanics, and pain. These impairments are limiting patient from cleaning, community activity, driving, and shopping. Personal factors including : cardiac, are also affecting patient's functional outcome. Patient will benefit from skilled PT to address above impairments and improve overall function.  REHAB POTENTIAL: Fair   CLINICAL DECISION MAKING: Stable/uncomplicated  EVALUATION COMPLEXITY: Low   GOALS: Goals reviewed with patient? Yes  SHORT TERM GOALS:  STG Name Target Date Goal status  1 Patient will be independent with initial HEP   04/01/21 INITIAL  2 Pt to demo independence with log roll transfer to protect back.  04/01/21 INITIAL         LONG TERM GOALS:   LTG Name Target Date Goal status  1 Patient will be independent with final HEP  04/29/21 INITIAL  2 Pt to report decreased pain in back and LEs to 0-2/10 with walking and activity.   04/29/21 INITIAL  3 Pt to demonstrate improved strength of bil LEs to be at least 4+/5, to improve ability for gait and stairs   04/29/21 INITIAL  4 Pt to demo improved score on TUG (TBD)   04/29/21 INITIAL  5 Pt to demo  improved score on 6 min walk test (TBD)   04/29/21 INITIAL     PLAN: PT FREQUENCY: 1-2x/week  PT DURATION: 6 weeks  PLANNED INTERVENTIONS: Therapeutic exercises, Therapeutic activity, Neuro Muscular re-education, Balance training, Gait training, Patient/Family education, Joint mobilization, Stair training, Vestibular training, Visual/preceptual remediation/compensation, DME instructions, Dry Needling, Spinal mobilization, Cryotherapy, Moist heat, Taping, Traction, and Manual therapy  PLAN FOR NEXT SESSION:   Lyndee Hensen, PT, DPT 1:21 PM  04/06/21

## 2021-04-07 ENCOUNTER — Other Ambulatory Visit (HOSPITAL_BASED_OUTPATIENT_CLINIC_OR_DEPARTMENT_OTHER): Payer: Self-pay

## 2021-04-07 ENCOUNTER — Other Ambulatory Visit: Payer: Self-pay | Admitting: *Deleted

## 2021-04-07 MED ORDER — BUTALBITAL-APAP-CAFFEINE 50-325-40 MG PO TABS
ORAL_TABLET | ORAL | 5 refills | Status: DC
  Filled 2021-04-07: qty 14, 7d supply, fill #0
  Filled 2021-05-14: qty 14, 7d supply, fill #1
  Filled 2021-06-09: qty 14, 7d supply, fill #2
  Filled 2021-06-30: qty 14, 7d supply, fill #3
  Filled 2021-07-17: qty 14, 7d supply, fill #4
  Filled 2021-08-07: qty 14, 7d supply, fill #5

## 2021-04-07 MED ORDER — PREDNISONE 10 MG PO TABS
10.0000 mg | ORAL_TABLET | Freq: Every day | ORAL | 1 refills | Status: DC
  Filled 2021-04-07: qty 90, 90d supply, fill #0
  Filled 2021-06-30: qty 90, 90d supply, fill #1

## 2021-04-07 NOTE — Telephone Encounter (Signed)
Patient Caregiver, Joelene Millin, called and stated that patient's Rx's were DENIED yesterday for: Prednisone 10mg  One daily Fioricet 50-325-40 One twice daily as needed for Headache  Confirmed patient is taking. Pended Rx's and sent to Dr. Lyndel Safe for approval.

## 2021-04-08 ENCOUNTER — Encounter: Payer: Medicare Other | Admitting: Physical Therapy

## 2021-04-09 ENCOUNTER — Other Ambulatory Visit: Payer: Self-pay

## 2021-04-09 ENCOUNTER — Encounter: Payer: Self-pay | Admitting: Rehabilitative and Restorative Service Providers"

## 2021-04-09 ENCOUNTER — Ambulatory Visit: Payer: Medicare Other | Attending: Neurological Surgery | Admitting: Rehabilitative and Restorative Service Providers"

## 2021-04-09 DIAGNOSIS — R2689 Other abnormalities of gait and mobility: Secondary | ICD-10-CM | POA: Diagnosis not present

## 2021-04-09 DIAGNOSIS — M6281 Muscle weakness (generalized): Secondary | ICD-10-CM | POA: Insufficient documentation

## 2021-04-09 NOTE — Therapy (Signed)
OUTPATIENT PHYSICAL THERAPY TREATMENT      Patient Name: Kevin KUENZI, MD MRN: [id number] DOB:1935/01/09, 87 y.o., male Today's Date: 04/09/2021     PT End of Session - 04/09/21 1359     Visit Number 7    Number of Visits 12    Date for PT Re-Evaluation 04/29/21    Authorization Type Medicare    PT Start Time 7342    PT Stop Time 1445    PT Time Calculation (min) 42 min    Activity Tolerance Patient tolerated treatment well    Behavior During Therapy WFL for tasks assessed/performed             Past Medical History:  Diagnosis Date   Aortic stenosis    moderate aortic stenosis   Arthritis    Benign prostatic hypertrophy    Chronic systolic heart failure (HCC)    Chronotropic incompetence with sinus node dysfunction (HCC)    Status post Guidant dual-mode, dual-pacing, dual-sensing  pacemaker   implantation now programmed to AAI with recent generator change.   Coronary artery disease    status post multiple prior percutaneous coronary interventions, microvascular angina per Dr Olevia Perches   Diverticulitis sigmoid colon recurrent    Dysfunctional autonomic nervous system    Dyspnea    Heart murmur    History of primary hypertension    Hypercoagulable state ([place])    chronically anticoagulated with coumadin   Hyperlipidemia    Hyperthyroidism    Hypothyroidism    Dr. 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 Lia Foyer,    Prediabetes 09/21/2017   Stroke ([place])    1990    Past Surgical History:  Procedure Laterality Date   AORTIC VALVE REPLACEMENT  03/15/2011   Procedure: AORTIC VALVE REPLACEMENT (AVR);  Surgeon: Gaye Pollack, MD;  Location: [place];  Service: Open Heart Surgery;  Laterality: N/A;   APPENDECTOMY     CARDIAC CATHETERIZATION     11   CARDIOVERSION     CARDIOVERSION  04/15/2011   Procedure: CARDIOVERSION;  Surgeon: Loralie Champagne,  MD;  Location: [place];  Service: Cardiovascular;  Laterality: N/A;   CARDIOVERSION N/A 09/11/2014   Procedure: CARDIOVERSION;  Surgeon: Sueanne Margarita, MD;  Location: [place] ENDOSCOPY;  Service: Cardiovascular;  Laterality: N/A;   CARDIOVERSION N/A 06/27/2015   Procedure: CARDIOVERSION;  Surgeon: Thayer Headings, MD;  Location: [place];  Service: Cardiovascular;  Laterality: N/A;   CARDIOVERSION N/A 07/04/2015   Procedure: CARDIOVERSION;  Surgeon: Evans Lance, MD;  Location: [place];  Service: Cardiovascular;  Laterality: N/A;   CARDIOVERSION N/A 04/13/2017   Procedure: CARDIOVERSION;  Surgeon: Sueanne Margarita, MD;  Location: [facility] ENDOSCOPY;  Service: Cardiovascular;  Laterality: N/A;   COLONOSCOPY     CORONARY STENT INTERVENTION N/A 10/15/2019   Procedure: CORONARY STENT INTERVENTION;  Surgeon: Jettie Booze, MD;  Location: [place] CV LAB;  Service: Cardiovascular;  Laterality: N/A;   EP IMPLANTABLE DEVICE N/A 06/16/2015   Procedure: Pacemaker Implant;  Surgeon: Evans Lance, MD;  Location: [place] CV LAB;  Service: Cardiovascular;  Laterality: N/A;   hemrrhoidectomy     LEFT AND RIGHT HEART CATHETERIZATION WITH CORONARY ANGIOGRAM Bilateral 02/01/2011   Procedure: LEFT AND RIGHT HEART CATHETERIZATION WITH CORONARY ANGIOGRAM;  Surgeon: Hillary Bow, MD;  Location: [facility] CATH LAB;  Service: Cardiovascular;  Laterality: Bilateral;  LEFT HEART CATH AND CORONARY ANGIOGRAPHY N/A 10/15/2019   Procedure: LEFT HEART CATH AND CORONARY ANGIOGRAPHY;  Surgeon: Jettie Booze, MD;  Location: [place] CV LAB;  Service: Cardiovascular;  Laterality: N/A;   LEFT HEART CATH AND CORONARY ANGIOGRAPHY N/A 10/07/2020   Procedure: LEFT HEART CATH AND CORONARY ANGIOGRAPHY;  Surgeon: Sherren Mocha, MD;  Location: [place] CV LAB;  Service: Cardiovascular;  Laterality: N/A;   LUMBAR LAMINECTOMY/DECOMPRESSION MICRODISCECTOMY Right 02/23/2021   Procedure: Right Lumbar four-five  microdiscectomy;  Surgeon: Eustace Moore, MD;  Location: [place];  Service: Neurosurgery;  Laterality: Right;   MAZE  03/15/2011   Procedure: MAZE;  Surgeon: Gaye Pollack, MD;  Location: [place];  Service: Open Heart Surgery;  Laterality: N/A;   PACEMAKER INSERTION  1991   Guidant PPM, most recent Generator Change by Dr Olevia Perches was 08/22/06   RIGHT/LEFT HEART CATH AND CORONARY ANGIOGRAPHY N/A 07/06/2016   Procedure: Right/Left Heart Cath and Coronary Angiography;  Surgeon: Sherren Mocha, MD;  Location: [place] CV LAB;  Service: Cardiovascular;  Laterality: N/A;   TEE WITHOUT CARDIOVERSION  04/15/2011   Procedure: TRANSESOPHAGEAL ECHOCARDIOGRAM (TEE);  Surgeon: Loralie Champagne, MD;  Location: [place];  Service: Cardiovascular;  Laterality: N/A;   TEE WITHOUT CARDIOVERSION N/A 09/11/2014   Procedure: TRANSESOPHAGEAL ECHOCARDIOGRAM (TEE);  Surgeon: Sueanne Margarita, MD;  Location: [facility] ENDOSCOPY;  Service: Cardiovascular;  Laterality: N/A;    There were no vitals filed for this visit.   Subjective Assessment - 04/09/21 1401     Subjective The patient is a current patient with OP Rehab at [facility] s/p low back microdiscectomy.  He has a h/o orthostatic hypotension and a prior h/o BPPV.  He notes his BP runs low and he sometimes has a hard time walking.   BP=100/60 was checked before coming to therapy.    Currently in Pain? Yes    Pain Score 3     Pain Location Back    Pain Orientation Lower    Pain Descriptors / Indicators Discomfort    Pain Type Chronic pain    Pain Onset More than a month ago    Pain Frequency Intermittent    Aggravating Factors  *monitored back during vestibular assessment-- no increase in pain.             OBJECTIVE:      Vestibular Assessment - 04/09/21 1410       Vestibular Assessment   General Observation Patient walks into clinic independently      Oculomotor Exam   Oculomotor Alignment Abnormal   uses head tilt   Ocular ROM abnormal   with  asymmetrical ROM noted with dysconjugate gaze   Spontaneous Absent    Gaze-induced  Absent    Smooth Pursuits --   abnormal/ asymmetry noted in visual ROM   Comment Patient has abnormalities with myasthesia gravis.  He has vertical diplopia and uses head tilt to compensate.  R eye ptosis noted.      Vestibulo-Ocular Reflex   Comment can do slow VOR, does get increased visual blurring and double vision      Positional Testing   Sidelying Test Sidelying Right;Sidelying Left    Horizontal Canal Testing Horizontal Canal Right;Horizontal Canal Left      Sidelying Right   Sidelying Right Duration feels better as compared to the left side    Sidelying Right Symptoms No nystagmus      Sidelying Left   Sidelying Left Duration nausea to the left  side    Sidelying Left Symptoms No nystagmus      Horizontal Canal Right   Horizontal Canal Right Duration none    Horizontal Canal Right Symptoms Normal      Horizontal Canal Left   Horizontal Canal Left Duration none    Horizontal Canal Left Symptoms Normal      Positional Sensitivities   Positional Sensitivities Comments The patient feels nausea with L sidelying and return to sitting from both sides      Orthostatics   BP sitting 109/75    HR sitting 79                      [facility] Adult PT Treatment/Exercise - 04/09/21 1643       Self-Care   Self-Care Other Self-Care Comments    Other Self-Care Comments  Patient and PT discussed no evidence today of positional vertigo.  He does have gaze deficits, however his visual deficits at baseline from myasthenia gravis would limit response to gaze adaptation exercises.             Vestibular Treatment/Exercise - 04/09/21 1643       Vestibular Treatment/Exercise   Vestibular Treatment Provided Gaze    Gaze Exercises X1 Viewing Horizontal      X1 Viewing Horizontal   Foot Position seated and standing    Comments cues for visual fixation to target; patient gets diplopia.   Patient in standing notes fatigue and has to reach for external support.              self: Discussed multi-factorial nature of current symptoms.  Recommended adherence to current HEP with ortho PT and f/u with vestibular rehab if symptoms do not continue to improve with activities.     HOME EXERCISE PROGRAM:  Access Code: [email or web address]   ASSESSMENT:   CLINICAL IMPRESSION: The patient has baseline oculomotor deficits due to myasthenia gravis with frequent episodes of diplopia.  He has seen eye doctor and does not use prisms.  In addition, he has dec'd gaze stability however training with adaptation not indicated due to double vision. The patient did not have evidence of positional symptoms at today's assessment. He does have motion sensitivity with sensations of altered midline and nausea with motion.  PT recommended he continue to progress his ortho PT program with general strength/conditioning.  More motion will also allow for improved adaptation/compensation of his visual/vestibular deficits and reduction of motion sensitivity.      Objective impairments include decreased activity tolerance, decreased balance, decreased coordination, decreased endurance, decreased mobility, difficulty walking, decreased ROM, decreased strength, decreased safety awareness, dizziness, increased muscle spasms, impaired flexibility, improper body mechanics, and pain. These impairments are limiting patient from cleaning, community activity, driving, and shopping. Personal factors including : cardiac, are also affecting patient's functional outcome. Patient will benefit from skilled PT to address above impairments and improve overall function.   REHAB POTENTIAL: Fair    CLINICAL DECISION MAKING: Stable/uncomplicated   EVALUATION COMPLEXITY: Low     GOALS: Goals reviewed with patient? Yes   SHORT TERM GOALS:   STG Name Target Date Goal status  1 Patient will be independent with initial HEP    04/01/21  INITIAL  2 Pt to demo independence with log roll transfer to protect back.  04/01/21 INITIAL               LONG TERM GOALS:    LTG Name Target Date Goal status  1 Patient will be  independent with final HEP   04/29/21 INITIAL  2 Pt to report decreased pain in back and LEs to 0-2/10 with walking and activity.    04/29/21 INITIAL  3 Pt to demonstrate improved strength of bil LEs to be at least 4+/5, to improve ability for gait and stairs    04/29/21 INITIAL  4 Pt to demo improved score on TUG (TBD)    04/29/21 INITIAL  5 Pt to demo improved score on 6 min walk test (TBD)    04/29/21 INITIAL        PLAN: PT FREQUENCY: 1-2x/week   PT DURATION: 6 weeks   PLANNED INTERVENTIONS: Therapeutic exercises, Therapeutic activity, Neuro Muscular re-education, Balance training, Gait training, Patient/Family education, Joint mobilization, Stair training, Vestibular training, Visual/preceptual remediation/compensation, DME instructions, Dry Needling, Spinal mobilization, Cryotherapy, Moist heat, Taping, Traction, and Manual therapy   PLAN FOR NEXT SESSION:   Patient to continue ortho PT visits with emphasis on increasing movement throughout the day.  If he continues with worsened nausea/constant sensation of dizziness at end of those sessions, will return to vestibular rehab for continued training.            Patient will benefit from skilled therapeutic intervention in order to improve the following deficits and impairments:     Visit Diagnosis: Muscle weakness (generalized)  Other abnormalities of gait and mobility  Acute bilateral low back pain without sciatica    Malu Pellegrini, PT 04/09/2021, 5:06 PM  [facility] 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

## 2021-04-10 ENCOUNTER — Other Ambulatory Visit (HOSPITAL_BASED_OUTPATIENT_CLINIC_OR_DEPARTMENT_OTHER): Payer: Self-pay

## 2021-04-12 ENCOUNTER — Encounter (HOSPITAL_COMMUNITY): Payer: Self-pay | Admitting: Emergency Medicine

## 2021-04-12 ENCOUNTER — Other Ambulatory Visit: Payer: Self-pay

## 2021-04-12 ENCOUNTER — Emergency Department (HOSPITAL_COMMUNITY): Payer: Medicare Other

## 2021-04-12 ENCOUNTER — Inpatient Hospital Stay (HOSPITAL_COMMUNITY): Payer: Medicare Other

## 2021-04-12 ENCOUNTER — Observation Stay (HOSPITAL_COMMUNITY)
Admission: EM | Admit: 2021-04-12 | Discharge: 2021-04-13 | Disposition: A | Discharge status: Home / Self Care | Payer: Medicare Other | Attending: Internal Medicine | Admitting: Internal Medicine

## 2021-04-12 DIAGNOSIS — G7000 Myasthenia gravis without (acute) exacerbation: Secondary | ICD-10-CM | POA: Diagnosis present

## 2021-04-12 DIAGNOSIS — R9389 Abnormal findings on diagnostic imaging of other specified body structures: Secondary | ICD-10-CM

## 2021-04-12 DIAGNOSIS — H547 Unspecified visual loss: Secondary | ICD-10-CM

## 2021-04-12 DIAGNOSIS — R2689 Other abnormalities of gait and mobility: Secondary | ICD-10-CM | POA: Diagnosis not present

## 2021-04-12 DIAGNOSIS — H3410 Central retinal artery occlusion, unspecified eye: Secondary | ICD-10-CM | POA: Diagnosis present

## 2021-04-12 DIAGNOSIS — Z955 Presence of coronary angioplasty implant and graft: Secondary | ICD-10-CM | POA: Insufficient documentation

## 2021-04-12 DIAGNOSIS — I672 Cerebral atherosclerosis: Secondary | ICD-10-CM | POA: Diagnosis not present

## 2021-04-12 DIAGNOSIS — I4819 Other persistent atrial fibrillation: Secondary | ICD-10-CM | POA: Insufficient documentation

## 2021-04-12 DIAGNOSIS — R29818 Other symptoms and signs involving the nervous system: Secondary | ICD-10-CM | POA: Diagnosis not present

## 2021-04-12 DIAGNOSIS — Z79899 Other long term (current) drug therapy: Secondary | ICD-10-CM | POA: Diagnosis not present

## 2021-04-12 DIAGNOSIS — R7303 Prediabetes: Secondary | ICD-10-CM | POA: Diagnosis not present

## 2021-04-12 DIAGNOSIS — D472 Monoclonal gammopathy: Secondary | ICD-10-CM | POA: Diagnosis present

## 2021-04-12 DIAGNOSIS — H539 Unspecified visual disturbance: Secondary | ICD-10-CM

## 2021-04-12 DIAGNOSIS — H3411 Central retinal artery occlusion, right eye: Principal | ICD-10-CM | POA: Insufficient documentation

## 2021-04-12 DIAGNOSIS — H34231 Retinal artery branch occlusion, right eye: Secondary | ICD-10-CM

## 2021-04-12 DIAGNOSIS — I110 Hypertensive heart disease with heart failure: Secondary | ICD-10-CM | POA: Insufficient documentation

## 2021-04-12 DIAGNOSIS — Z7901 Long term (current) use of anticoagulants: Secondary | ICD-10-CM | POA: Diagnosis not present

## 2021-04-12 DIAGNOSIS — D3160 Benign neoplasm of unspecified site of unspecified orbit: Secondary | ICD-10-CM | POA: Diagnosis not present

## 2021-04-12 DIAGNOSIS — E039 Hypothyroidism, unspecified: Secondary | ICD-10-CM | POA: Insufficient documentation

## 2021-04-12 DIAGNOSIS — M47812 Spondylosis without myelopathy or radiculopathy, cervical region: Secondary | ICD-10-CM | POA: Diagnosis not present

## 2021-04-12 DIAGNOSIS — I63233 Cerebral infarction due to unspecified occlusion or stenosis of bilateral carotid arteries: Secondary | ICD-10-CM | POA: Diagnosis not present

## 2021-04-12 DIAGNOSIS — H538 Other visual disturbances: Secondary | ICD-10-CM | POA: Diagnosis present

## 2021-04-12 DIAGNOSIS — I5022 Chronic systolic (congestive) heart failure: Secondary | ICD-10-CM | POA: Insufficient documentation

## 2021-04-12 DIAGNOSIS — Z8673 Personal history of transient ischemic attack (TIA), and cerebral infarction without residual deficits: Secondary | ICD-10-CM | POA: Diagnosis not present

## 2021-04-12 DIAGNOSIS — Z950 Presence of cardiac pacemaker: Secondary | ICD-10-CM | POA: Insufficient documentation

## 2021-04-12 DIAGNOSIS — I2510 Atherosclerotic heart disease of native coronary artery without angina pectoris: Secondary | ICD-10-CM | POA: Diagnosis not present

## 2021-04-12 DIAGNOSIS — Z20822 Contact with and (suspected) exposure to covid-19: Secondary | ICD-10-CM | POA: Insufficient documentation

## 2021-04-12 DIAGNOSIS — H0589 Other disorders of orbit: Secondary | ICD-10-CM | POA: Diagnosis present

## 2021-04-12 DIAGNOSIS — E7800 Pure hypercholesterolemia, unspecified: Secondary | ICD-10-CM | POA: Diagnosis present

## 2021-04-12 DIAGNOSIS — R9431 Abnormal electrocardiogram [ECG] [EKG]: Secondary | ICD-10-CM | POA: Diagnosis not present

## 2021-04-12 DIAGNOSIS — R930 Abnormal findings on diagnostic imaging of skull and head, not elsewhere classified: Secondary | ICD-10-CM | POA: Diagnosis not present

## 2021-04-12 DIAGNOSIS — H5461 Unqualified visual loss, right eye, normal vision left eye: Secondary | ICD-10-CM | POA: Diagnosis not present

## 2021-04-12 DIAGNOSIS — I6503 Occlusion and stenosis of bilateral vertebral arteries: Secondary | ICD-10-CM | POA: Diagnosis not present

## 2021-04-12 DIAGNOSIS — I10 Essential (primary) hypertension: Secondary | ICD-10-CM | POA: Diagnosis present

## 2021-04-12 DIAGNOSIS — J3489 Other specified disorders of nose and nasal sinuses: Secondary | ICD-10-CM | POA: Diagnosis not present

## 2021-04-12 LAB — RESP PANEL BY RT-PCR (FLU A&B, COVID) ARPGX2
Influenza A by PCR: NEGATIVE
Influenza B by PCR: NEGATIVE
SARS Coronavirus 2 by RT PCR: NEGATIVE

## 2021-04-12 LAB — I-STAT CHEM 8, ED
BUN: 16 mg/dL (ref 8–23)
Calcium, Ion: 1.09 mmol/L — ABNORMAL LOW (ref 1.15–1.40)
Chloride: 103 mmol/L (ref 98–111)
Creatinine, Ser: 1 mg/dL (ref 0.61–1.24)
Glucose, Bld: 195 mg/dL — ABNORMAL HIGH (ref 70–99)
HCT: 35 % — ABNORMAL LOW (ref 39.0–52.0)
Hemoglobin: 11.9 g/dL — ABNORMAL LOW (ref 13.0–17.0)
Potassium: 4.4 mmol/L (ref 3.5–5.1)
Sodium: 139 mmol/L (ref 135–145)
TCO2: 26 mmol/L (ref 22–32)

## 2021-04-12 LAB — COMPREHENSIVE METABOLIC PANEL
ALT: 19 U/L (ref 0–44)
AST: 26 U/L (ref 15–41)
Albumin: 4 g/dL (ref 3.5–5.0)
Alkaline Phosphatase: 82 U/L (ref 38–126)
Anion gap: 11 (ref 5–15)
BUN: 16 mg/dL (ref 8–23)
CO2: 26 mmol/L (ref 22–32)
Calcium: 9.1 mg/dL (ref 8.9–10.3)
Chloride: 101 mmol/L (ref 98–111)
Creatinine, Ser: 1.11 mg/dL (ref 0.61–1.24)
GFR, Estimated: >60 mL/min (ref >60)
Glucose, Bld: 203 mg/dL — ABNORMAL HIGH (ref 70–99)
Potassium: 4.4 mmol/L (ref 3.5–5.1)
Sodium: 138 mmol/L (ref 135–145)
Total Bilirubin: 0.6 mg/dL (ref 0.3–1.2)
Total Protein: 7.5 g/dL (ref 6.5–8.1)

## 2021-04-12 LAB — DIFFERENTIAL
Abs Immature Granulocytes: 0.05 10*3/uL (ref 0.00–0.07)
Basophils Absolute: 0 10*3/uL (ref 0.0–0.1)
Basophils Relative: 0 %
Eosinophils Absolute: 0.1 10*3/uL (ref 0.0–0.5)
Eosinophils Relative: 1 %
Immature Granulocytes: 1 %
Lymphocytes Relative: 5 %
Lymphs Abs: 0.5 10*3/uL — ABNORMAL LOW (ref 0.7–4.0)
Monocytes Absolute: 0.3 10*3/uL (ref 0.1–1.0)
Monocytes Relative: 3 %
Neutro Abs: 9.5 10*3/uL — ABNORMAL HIGH (ref 1.7–7.7)
Neutrophils Relative %: 90 %

## 2021-04-12 LAB — PROTIME-INR
INR: 1.2 (ref 0.8–1.2)
Prothrombin Time: 14.9 seconds (ref 11.4–15.2)

## 2021-04-12 LAB — URINALYSIS, ROUTINE W REFLEX MICROSCOPIC
Bilirubin Urine: NEGATIVE
Glucose, UA: NEGATIVE mg/dL
Hgb urine dipstick: NEGATIVE
Ketones, ur: NEGATIVE mg/dL
Leukocytes,Ua: NEGATIVE
Nitrite: NEGATIVE
Protein, ur: NEGATIVE mg/dL
Specific Gravity, Urine: 1.009 (ref 1.005–1.030)
pH: 7 (ref 5.0–8.0)

## 2021-04-12 LAB — CBC
HCT: 36.6 % — ABNORMAL LOW (ref 39.0–52.0)
Hemoglobin: 11.2 g/dL — ABNORMAL LOW (ref 13.0–17.0)
MCH: 27.9 pg (ref 26.0–34.0)
MCHC: 30.6 g/dL (ref 30.0–36.0)
MCV: 91 fL (ref 80.0–100.0)
Platelets: 212 10*3/uL (ref 150–400)
RBC: 4.02 MIL/uL — ABNORMAL LOW (ref 4.22–5.81)
RDW: 15.4 % (ref 11.5–15.5)
WBC: 10.5 10*3/uL (ref 4.0–10.5)
nRBC: 0 % (ref 0.0–0.2)

## 2021-04-12 LAB — RAPID URINE DRUG SCREEN, HOSP PERFORMED
Amphetamines: NOT DETECTED
Barbiturates: POSITIVE — AB
Benzodiazepines: NOT DETECTED
Cocaine: NOT DETECTED
Opiates: POSITIVE — AB
Tetrahydrocannabinol: NOT DETECTED

## 2021-04-12 LAB — CBG MONITORING, ED: Glucose-Capillary: 183 mg/dL — ABNORMAL HIGH (ref 70–99)

## 2021-04-12 LAB — ETHANOL: Alcohol, Ethyl (B): <10 mg/dL (ref <10)

## 2021-04-12 LAB — APTT: aPTT: 30 seconds (ref 24–36)

## 2021-04-12 MED ORDER — MAGNESIUM OXIDE -MG SUPPLEMENT 400 (240 MG) MG PO TABS
400.0000 mg | ORAL_TABLET | Freq: Every day | ORAL | Status: DC
  Administered 2021-04-13: 400 mg via ORAL
  Filled 2021-04-12: qty 1

## 2021-04-12 MED ORDER — ASPIRIN EC 81 MG PO TBEC
81.0000 mg | DELAYED_RELEASE_TABLET | Freq: Every day | ORAL | Status: DC
  Administered 2021-04-13: 81 mg via ORAL
  Filled 2021-04-12 (×2): qty 1

## 2021-04-12 MED ORDER — IOHEXOL 350 MG/ML SOLN
100.0000 mL | Freq: Once | INTRAVENOUS | Status: AC | PRN
  Administered 2021-04-12: 100 mL via INTRAVENOUS

## 2021-04-12 MED ORDER — DIPHENHYDRAMINE HCL 50 MG/ML IJ SOLN
25.0000 mg | Freq: Once | INTRAMUSCULAR | Status: AC
  Administered 2021-04-12: 25 mg via INTRAVENOUS
  Filled 2021-04-12: qty 1

## 2021-04-12 MED ORDER — EZETIMIBE 10 MG PO TABS
10.0000 mg | ORAL_TABLET | Freq: Every day | ORAL | Status: DC
  Administered 2021-04-12: 10 mg via ORAL
  Filled 2021-04-12: qty 1

## 2021-04-12 MED ORDER — STROKE: EARLY STAGES OF RECOVERY BOOK
Freq: Once | Status: AC
  Administered 2021-04-12: 1
  Filled 2021-04-12: qty 1

## 2021-04-12 MED ORDER — ACETAMINOPHEN 325 MG PO TABS: 650.0000 mg | ORAL_TABLET | ORAL | Status: DC | PRN

## 2021-04-12 MED ORDER — PREDNISONE 10 MG PO TABS
10.0000 mg | ORAL_TABLET | Freq: Every day | ORAL | Status: DC
  Administered 2021-04-13: 10 mg via ORAL
  Filled 2021-04-12: qty 1

## 2021-04-12 MED ORDER — HYDROCODONE-ACETAMINOPHEN 5-325 MG PO TABS
1.5000 | ORAL_TABLET | Freq: Four times a day (QID) | ORAL | Status: DC | PRN
  Administered 2021-04-12 – 2021-04-13 (×3): 1.5 via ORAL
  Filled 2021-04-12 (×3): qty 2

## 2021-04-12 MED ORDER — LORAZEPAM 1 MG PO TABS
1.0000 mg | ORAL_TABLET | Freq: Every day | ORAL | Status: DC
  Administered 2021-04-12: 1 mg via ORAL
  Filled 2021-04-12: qty 1

## 2021-04-12 MED ORDER — SODIUM CHLORIDE 0.9% FLUSH: 3.0000 mL | Freq: Once | INTRAVENOUS | Status: DC

## 2021-04-12 MED ORDER — PANTOPRAZOLE SODIUM 40 MG PO TBEC
40.0000 mg | DELAYED_RELEASE_TABLET | Freq: Every day | ORAL | Status: DC
  Administered 2021-04-13: 40 mg via ORAL
  Filled 2021-04-12: qty 1

## 2021-04-12 MED ORDER — DIPHENHYDRAMINE HCL 50 MG/ML IJ SOLN: 25.0000 mg | Freq: Once | INTRAMUSCULAR | Status: DC

## 2021-04-12 MED ORDER — POLYETHYLENE GLYCOL 3350 17 G PO PACK
17.0000 g | PACK | Freq: Every day | ORAL | Status: DC
  Administered 2021-04-13: 17 g via ORAL
  Filled 2021-04-12: qty 1

## 2021-04-12 MED ORDER — ACETAMINOPHEN 160 MG/5ML PO SOLN: 650.0000 mg | ORAL | Status: DC | PRN

## 2021-04-12 MED ORDER — DIGOXIN 125 MCG PO TABS
0.1250 mg | ORAL_TABLET | Freq: Every day | ORAL | Status: DC
  Administered 2021-04-13: 0.125 mg via ORAL
  Filled 2021-04-12: qty 1

## 2021-04-12 MED ORDER — LABETALOL HCL 5 MG/ML IV SOLN: 10.0000 mg | INTRAVENOUS | Status: DC | PRN

## 2021-04-12 MED ORDER — DOCUSATE SODIUM 100 MG PO CAPS
100.0000 mg | ORAL_CAPSULE | Freq: Two times a day (BID) | ORAL | Status: DC
  Administered 2021-04-12 – 2021-04-13 (×2): 100 mg via ORAL
  Filled 2021-04-12 (×2): qty 1

## 2021-04-12 MED ORDER — DIPHENHYDRAMINE HCL 50 MG/ML IJ SOLN
50.0000 mg | Freq: Once | INTRAMUSCULAR | Status: AC
  Administered 2021-04-12: 50 mg via INTRAVENOUS
  Filled 2021-04-12: qty 1

## 2021-04-12 MED ORDER — DILTIAZEM HCL 30 MG PO TABS
30.0000 mg | ORAL_TABLET | ORAL | Status: AC
  Administered 2021-04-12: 30 mg via ORAL
  Filled 2021-04-12: qty 1

## 2021-04-12 MED ORDER — METHYLPREDNISOLONE SODIUM SUCC 125 MG IJ SOLR
125.0000 mg | Freq: Once | INTRAMUSCULAR | Status: AC
  Administered 2021-04-12: 125 mg via INTRAVENOUS
  Filled 2021-04-12: qty 2

## 2021-04-12 MED ORDER — ROSUVASTATIN CALCIUM 20 MG PO TABS
20.0000 mg | ORAL_TABLET | Freq: Every day | ORAL | Status: DC
  Administered 2021-04-12: 20 mg via ORAL
  Filled 2021-04-12: qty 1

## 2021-04-12 MED ORDER — APIXABAN 5 MG PO TABS: 5.0000 mg | ORAL_TABLET | Freq: Two times a day (BID) | ORAL | Status: DC

## 2021-04-12 MED ORDER — DOXAZOSIN MESYLATE 4 MG PO TABS
4.0000 mg | ORAL_TABLET | Freq: Every day | ORAL | Status: DC
  Administered 2021-04-12: 4 mg via ORAL
  Filled 2021-04-12 (×2): qty 1

## 2021-04-12 MED ORDER — ACETAMINOPHEN 650 MG RE SUPP: 650.0000 mg | RECTAL | Status: DC | PRN

## 2021-04-12 MED ORDER — INSULIN ASPART 100 UNIT/ML IJ SOLN
0.0000 [IU] | Freq: Three times a day (TID) | INTRAMUSCULAR | Status: DC
  Administered 2021-04-13: 2 [IU] via SUBCUTANEOUS

## 2021-04-12 NOTE — Assessment & Plan Note (Addendum)
-  Continue Crestor, Zetia -Check lipid panel and showed a total cholesterol/HDL ratio of 2.1, cholesterol level 131, HDL 62, LDL 64, triglycerides of 27, VLDL 5 -Prior LDL was at goal and is LDL 64 and neurology recommends continuing current dose of statin

## 2021-04-12 NOTE — ED Notes (Signed)
Pt premedicated at this time for contrast allergy with solumedrol and benadryl.

## 2021-04-12 NOTE — ED Notes (Signed)
Pt bp maintains in the 817R systolic. Pt denies headache or any other discomfort. Dr.Yates sent a message regarding pt bp. Will continue to monitor.

## 2021-04-12 NOTE — ED Notes (Signed)
Dinner tray ordered.

## 2021-04-12 NOTE — ED Notes (Addendum)
Per neuro's orders, permissive hypertension allowed at this time with 220/120 parameters. Will continue to monitor.

## 2021-04-12 NOTE — Assessment & Plan Note (Addendum)
-  From Amiodarone -Last thyroid testing was normal without medications -Follow-up thyroid function tests in the next 4 to 6 weeks

## 2021-04-12 NOTE — ED Notes (Signed)
Taken to CT by transporter.

## 2021-04-12 NOTE — Assessment & Plan Note (Signed)
-  Annual f/u with Dr. Benay Spice, next due in 07/2021

## 2021-04-12 NOTE — Assessment & Plan Note (Addendum)
-  Patient with prior remote h/o CVA presenting with acute onset of R partial visual impairment -He was seen by ophthalmology yesterday AM and diagnosed with branch retinal artery occlusion -However, he also has had ataxia and dizziness recently and this is concerning for CVA; has recently had dizziness and ataxia and this has been more of a chronic issue as well -Aspirin has been given to reduce stroke mortality and decrease morbidity but he never received this as he refused -Patient was admitted for CVA evaluation -Telemetry monitoring while hospitalized -MRI/MRA is not an option given his pacemaker -Carotid dopplers ordered and showed 1 to 39% stenosis in the left and right carotid arteries;  -Echo ordered and showed an EF of 60 to 65% with moderate LVH, left ventricular basal inferior hypokinesis as well as a dilated left atrium and a prosthetic aortic valve with no atrial shunt -Has known afib but reports strict compliance with Eliquis -Risk stratification with FLP, A1c; his hemoglobin A1c of 6.5 and his LDL of 64 -Neurology consult was appreciated and they recommended continuing anticoagulant Eliquis and starting the patient on Pletal 100 mg p.o. twice daily -PT OT recommended outpatient PT but no OT follow-up -PT/OT/ST/Nutrition Consults appreciated -Patient has been deemed medically stable after his complete work-up and will need to follow-up with neurology in outpatient setting in 2 months as well as his cardiologist Dr. Lovena Le within 1 to 2 weeks.

## 2021-04-12 NOTE — Consult Note (Signed)
Neurology Consultation Reason for Consult: BRAO Referring Physician: Jeanell Sparrow, D  CC: Dizziness.   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  He states that he is woke up around 6 AM, and then went back to sleep and when he woke up around 9 AM, it was like a shade was over his vision.  He presented to his ophthalmologist who diagnosed with a branch retinal artery occlusion.  He was sent into the emergency department for stroke work-up.  On another note, he has had worsening gait dysfunction over the past 2 to 3 days.  He has a longstanding history of vertigo, and states this feels similar but his nurse reports that it seems much worse than typical.   LKW: 6 AM tpa given?: no, out of window and anticoagulated     ROS: A 14 point ROS was performed and is negative except as noted in the HPI.   Past Medical History:  Diagnosis Date   Aortic stenosis    moderate aortic stenosis   Arthritis    Benign prostatic hypertrophy    Chronic systolic heart failure (HCC)    Chronotropic incompetence with sinus node dysfunction (HCC)    Status post Guidant dual-mode, dual-pacing, dual-sensing  pacemaker   implantation now programmed to AAI with recent generator change.   Coronary artery disease    status post multiple prior percutaneous coronary interventions, microvascular angina per Dr Olevia Perches   Diverticulitis sigmoid colon recurrent    Dysfunctional autonomic nervous system    Dyspnea    Heart murmur    History of primary hypertension    Hypercoagulable state ([place])    chronically anticoagulated with coumadin   Hyperlipidemia    Hyperthyroidism    Hypothyroidism    Dr. 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 Lia Foyer,    Prediabetes 09/21/2017   Stroke ([place])    1990     Family History  Problem Relation Age of Onset   Heart disease Brother        Twin brother has coronary disease and recent AVR for AS   CAD Brother    Atrial fibrillation Brother    Sarcoidosis Brother    Depression Daughter    Anorexia nervosa Daughter    Hypertension Son    Anesthesia problems Neg Hx    Hypotension Neg Hx    Malignant hyperthermia Neg Hx    Pseudochol deficiency Neg Hx      Social History:  reports that he has never smoked. He has never used smokeless tobacco. He reports that he does not drink alcohol and does not use drugs.   Exam: Current vital signs: BP (!) 181/101    Pulse 85    Temp 98 F (36.7 C) (Oral)    Resp 15    SpO2 96%  Vital signs in last 24 hours: Temp:  [98 F (36.7 C)] 98 F (36.7 C) (02/12 1430) Pulse Rate:  [85-88] 85 (02/12 1455) Resp:  [15-18] 15 (02/12 1455) BP: (178-181)/(101-128) 181/101 (02/12 1455) SpO2:  [93 %-96 %] 96 % (02/12 1455)   Physical Exam  Constitutional: Appears well-developed and well-nourished.  Psych: Affect appropriate to situation Eyes: No scleral injection HENT: No OP obstruction MSK:  no joint deformities.  Cardiovascular: Normal rate and regular rhythm.  Respiratory: Effort normal, non-labored breathing GI: Soft.  No distension. There is no tenderness.  Skin: WDI  Neuro: Mental Status: Patient is awake, alert, oriented to person, place, month, year, and situation. Patient is able to give a clear and coherent history. No signs of aphasia or neglect Cranial Nerves: II: Visual Fields are full in the left eye.  Left pupil is reactive, right pupil is pharmacologically dilated III,IV, VI: He has right ptosis, diplopia which resolves with upward gaze, without clear disconjugate gaze V: Facial sensation is symmetric to temperature VII: Facial movement is symmetric.  VIII: hearing is intact to voice X: Uvula  elevates symmetrically XI: Shoulder shrug is symmetric. XII: tongue is midline without atrophy or fasciculations.  Motor: Tone is normal. Bulk is normal. 5/5 strength was present in all four extremities.  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  He is being admitted for stroke work-up/PT evaluation.  I am not sure if lesion in his orbit on the right represents any danger with anticoagulation, but would favor further evaluation with CTA.  If it looks masslike as opposed to vascular, I would favor starting anticoagulation, if vascular I would discuss with ophthalmology if there is any contraindication.  If no other contraindication, I would favor restarting anticoagulation at this time.  Recommendations: - HgbA1c, fasting lipid panel - Frequent neuro checks - Echocardiogram - CTA head and neck, also orbits with contrast - Prophylactic therapy-Antiplatelet med: Aspirin - dose 81mg  daily if orbital mass represents danger with anticoagulation, otherwise Eliquis 5 mg twice daily - Risk factor modification - Telemetry monitoring - PT consult, OT consult, Speech consult - Stroke team to follow    [name], MD [facility] [phone]  If 7pm- 7am, please page neurology on call as listed in [place].

## 2021-04-12 NOTE — ED Provider Notes (Signed)
[facility] EMERGENCY DEPARTMENT Provider Note   CSN: [id number] Arrival date & time: 04/12/21  1424     History  No chief complaint on file.   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  HPI  9:02 PM Seen on initial evaluation at registration 87 year old male history of stroke, on Eliquis, and today complaining of right eye visual changes.  He states that he woke up at 6 AM and vision was normal.  At 9 AM when he woke up he felt like there was a shade coming over to his right eye.  His nurse reports that he has had difficulties walking intermittently over the past 2 days with dizziness.  Has not walked intermittently because he is felt very dizzy.  He called his ophthalmologist and with reports that he was seen in the office.  He reports that the ophthalmologist did not feel that there was any retinal detachment or acute abnormality of the right eye.  He was sent to the emergency department for evaluation of stroke     Home Medications Prior to Admission medications   Medication Sig Start Date End Date Taking? Authorizing Provider  apixaban (ELIQUIS) 5 MG TABS tablet TAKE 1 TABLET BY MOUTH 2 TIMES DAILY. Patient taking differently: Take 5 mg by mouth 2 (two) times daily. 03/20/21 03/20/22 Yes Sherren Mocha, MD  butalbital-acetaminophen-caffeine (FIORICET) [id number] MG tablet Take 1 tablet by mouth twice daily as needed for headache. 04/07/21  Yes [name], MD  Cholecalciferol (VITAMIN D3) 2000 units capsule Take 2,000 Units by mouth daily.   Yes [provider]  Coenzyme Q10 200 MG TABS Take 200 mg by mouth daily.   Yes [provider]  desoximetasone (TOPICORT) 0.25 % cream Apply 1 application topically 2 (two) times daily. Patient taking differently: Apply 1 application topically 2 (two) times daily as needed (rash or irritation). 12/31/20  Yes [name], MD  diclofenac Sodium (VOLTAREN) 1 % GEL Apply 2 g topically 2 (two) times daily as  needed (knee pain).   Yes [provider]  digoxin (LANOXIN) 0.125 MG tablet Take 1 tablet (0.125 mg total) by mouth daily. 12/31/20  Yes [name], MD  diltiazem (CARDIZEM) 30 MG tablet Take 30 mg by mouth daily as needed (sbp <130).   Yes [provider]  docusate sodium (COLACE) 100 MG capsule Take 100 mg by mouth 2 (two) times daily.   Yes [provider]  doxazosin (CARDURA) 4 MG tablet Take 1 tablet by mouth daily Patient taking differently: Take 4 mg by mouth at bedtime. 11/06/20  Yes   ezetimibe (ZETIA) 10 MG tablet Take 1 tablet (10 mg total) by mouth daily. Patient taking differently: Take 10 mg by mouth at bedtime. 06/20/20  Yes Evans Lance, MD  HYDROcodone-acetaminophen (NORCO) 5-325 MG tablet Take 1 tablet by mouth 3 (three) times daily. Patient taking differently: Take 1.5 tablets by mouth in the morning and at bedtime. 03/25/21  Yes [name], MD  LORazepam (ATIVAN) 1 MG tablet TAKE 1 TABLET BY MOUTH AT BEDTIME. MAY ALSO TAKE 1/2 TABLET DAILY AS NEEDED FOR ANXIETY. Patient taking differently: Take 1 mg by mouth at bedtime. 03/25/21 09/21/21 Yes [name], MD  magnesium oxide (MAG-OX) 400 MG tablet Take 400 mg by mouth daily.   Yes [provider]  metoprolol succinate (TOPROL-XL) 50 MG 24 hr tablet Take 1/2 tablet (25 mg) in the AM and 1 tablet (50 mg) in  the PM. Patient taking differently: Take 25-50 mg by mouth See admin instructions. 25mg  in the morning and 50mg  in th evening 03/25/21  Yes Evans Lance, MD  mometasone (ELOCON) 0.1 % cream Apply 1 application topically daily. Patient taking differently: Apply 1 application topically daily as needed (rash or irritation). 12/31/20  Yes [name], MD  Multiple Vitamins-Minerals (MULTIVITAMIN WITH MINERALS) tablet Take 1 tablet by mouth daily.   Yes [provider]  nitroGLYCERIN (NITROSTAT) 0.4 MG SL tablet PLACE 1 TABLET UNDER THE TONGUE EVERY 5 MINUTES AS NEEDED FOR  CHEST PAIN. Patient taking differently: Place 0.4 mg under the tongue every 5 (five) minutes as needed for chest pain. 01/01/21 01/01/22 Yes Sherren Mocha, MD  pantoprazole (PROTONIX) 40 MG tablet TAKE 1 TABLET BY MOUTH DAILY. Patient taking differently: Take 40 mg by mouth daily. 10/21/20 10/21/21 Yes Sherren Mocha, MD  polyethylene glycol (MIRALAX / GLYCOLAX) 17 g packet Take 17 g by mouth daily.   Yes [provider]  potassium chloride SA (KLOR-CON M) 20 MEQ tablet TAKE 1 TABLET BY MOUTH DAILY. Patient taking differently: Take 20 mEq by mouth daily. 03/20/21  Yes Sherren Mocha, MD  predniSONE (DELTASONE) 10 MG tablet Take 1 tablet (10 mg total) by mouth daily with breakfast. Patient taking differently: Take 10 mg by mouth daily with breakfast. Continuous 04/07/21  Yes [name], MD  rosuvastatin (CRESTOR) 20 MG tablet TAKE 1 TABLET (20 MG TOTAL) BY MOUTH AT BEDTIME. Patient taking differently: Take 20 mg by mouth at bedtime. 01/20/21 01/20/22 Yes Sherren Mocha, MD  pregabalin (LYRICA) 75 MG capsule Take 75 mg by mouth 2 (two) times daily.  03/23/21  [provider]      Allergies    Contrast media [iodinated contrast media], Gadolinium derivatives, and Metrizamide    Review of Systems   Review of Systems  All other systems reviewed and are negative.  Physical Exam Updated Vital Signs BP (!) 183/121    Pulse 71    Temp 98 F (36.7 C) (Oral)    Resp 12    SpO2 95%  Physical Exam Vitals and nursing note reviewed.  Constitutional:      Appearance: Normal appearance.  HENT:     Head: Normocephalic and atraumatic.     Right Ear: External ear normal.     Left Ear: External ear normal.     Nose: Nose normal.     Mouth/Throat:     Mouth: Mucous membranes are moist.  Eyes:     Comments: Right pupil dilated without reactivity Pupils midsize Extraocular movements are intact  Cardiovascular:     Rate and Rhythm: Normal rate. Rhythm irregular.     Pulses:  Normal pulses.  Pulmonary:     Effort: Pulmonary effort is normal.     Breath sounds: Normal breath sounds.  Abdominal:     General: Abdomen is flat.     Palpations: Abdomen is soft.  Musculoskeletal:        General: Normal range of motion.  Skin:    General: Skin is warm.     Capillary Refill: Capillary refill takes less than 2 seconds.  Neurological:     General: No focal deficit present.     Mental Status: He is alert.     Cranial Nerves: No cranial nerve deficit.     Sensory: No sensory deficit.     Motor: No weakness.     Coordination: Coordination normal.     Deep Tendon  Reflexes: Reflexes normal.     Comments: Gait not tested Patient appears somewhat off balance when he transferred from wheelchair to bed Finger-to-nose normal Heel-to-shin normal  Psychiatric:        Mood and Affect: Mood normal.        Behavior: Behavior normal.        Thought Content: Thought content normal.    ED Results / Procedures / Treatments   Labs (all labs ordered are listed, but only abnormal results are displayed) Labs Reviewed  CBC - Abnormal; Notable for the following components:      Result Value   RBC 4.02 (*)    Hemoglobin 11.2 (*)    HCT 36.6 (*)    All other components within normal limits  DIFFERENTIAL - Abnormal; Notable for the following components:   Neutro Abs 9.5 (*)    Lymphs Abs 0.5 (*)    All other components within normal limits  COMPREHENSIVE METABOLIC PANEL - Abnormal; Notable for the following components:   Glucose, Bld 203 (*)    All other components within normal limits  RAPID URINE DRUG SCREEN, HOSP PERFORMED - Abnormal; Notable for the following components:   Opiates POSITIVE (*)    Barbiturates POSITIVE (*)    All other components within normal limits  I-STAT CHEM 8, ED - Abnormal; Notable for the following components:   Glucose, Bld 195 (*)    Calcium, Ion 1.09 (*)    Hemoglobin 11.9 (*)    HCT 35.0 (*)    All other components within normal limits   CBG MONITORING, ED - Abnormal; Notable for the following components:   Glucose-Capillary 183 (*)    All other components within normal limits  RESP PANEL BY RT-PCR (FLU A&B, COVID) ARPGX2  PROTIME-INR  APTT  ETHANOL  URINALYSIS, ROUTINE W REFLEX MICROSCOPIC  DIGOXIN LEVEL  HEMOGLOBIN A1C  LIPID PANEL    EKG EKG Interpretation  Date/Time:  Sunday April 12 2021 14:34:49 EST Ventricular Rate:  88 PR Interval:    QRS Duration: 136 QT Interval:  426 QTC Calculation: 515 R Axis:   -60 Text Interpretation: Atrial fibrillation Right bundle branch block Left anterior fascicular block  Left ventricular hypertrophy with repolarization abnormality ( R in aVL , Romhilt-Estes ) Abnormal ECG When compared with ECG of 07-Oct-2020 13:29,  no significant change noted Confirmed by Pattricia Boss [phone]) on 04/12/2021 2:44:23 PM  Radiology CT HEAD WO CONTRAST  Result Date: 04/12/2021 CLINICAL DATA:  Neuro deficit, acute, stroke suspected. Loss of vision in right eye EXAM: CT HEAD WITHOUT CONTRAST TECHNIQUE: Contiguous axial images were obtained from the base of the skull through the vertex without intravenous contrast. RADIATION DOSE REDUCTION: This exam was performed according to the departmental dose-optimization program which includes automated exposure control, adjustment of the mA and/or kV according to patient size and/or use of iterative reconstruction technique. COMPARISON:  05/17/2012 FINDINGS: Brain: No evidence of acute infarction, hemorrhage, hydrocephalus, extra-axial collection or mass lesion/mass effect. 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 Scattered low-density changes within the periventricular and subcortical white matter compatible with chronic microvascular ischemic change. Mild diffuse cerebral volume loss. Vascular: Atherosclerotic calcifications involving the large vessels of the skull base. No unexpected hyperdense vessel. Skull: Normal. Negative for fracture  or focal lesion. Sinuses/Orbits: There is a new extraconal lesion within the superolateral aspect of the right orbit measuring approximately 13 x 8 x 10 mm (series 3, image 14; series 5, image 21), new from prior CT. Prominence of the  right superior ophthalmic vein which appears to extend directly into the lesion (series 5, images 14-21). No additional lesions identified. Normal appearance of the intraconal fat. No fluid collection. Left orbital structures are within normal limits. Paranasal sinuses and mastoid air cells are clear. Other: None. IMPRESSION: 1. 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 Prominence of the right superior ophthalmic vein which appears to extend directly into the lesion. Appearance is nonspecific with differential including vascular malformation, venous varix, versus solid neoplasm. Further evaluation with contrast-enhanced MRI of the orbits is recommended. 2. No acute intracranial abnormality. 3. Chronic microvascular ischemic disease and cerebral volume loss. 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 Electronically Signed   By: Davina Poke D.O.   On: 04/12/2021 16:09    Procedures Procedures    Medications Ordered in ED Medications  HYDROcodone-acetaminophen (NORCO/VICODIN) 5-325 MG per tablet 1.5 tablet (has no administration in time range)  digoxin (LANOXIN) tablet 0.125 mg (has no administration in time range)  ezetimibe (ZETIA) tablet 10 mg (has no administration in time range)  rosuvastatin (CRESTOR) tablet 20 mg (has no administration in time range)  LORazepam (ATIVAN) tablet 1 mg (has no administration in time range)  predniSONE (DELTASONE) tablet 10 mg (has no administration in time range)  docusate sodium (COLACE) capsule 100 mg (has no administration in time range)  magnesium oxide (MAG-OX) tablet 400 mg (has no administration in time range)  pantoprazole (PROTONIX) EC tablet 40  mg (has no administration in time range)  polyethylene glycol (MIRALAX / GLYCOLAX) packet 17 g (has no administration in time range)  acetaminophen (TYLENOL) tablet 650 mg (has no administration in time range)    Or  acetaminophen (TYLENOL) 160 MG/5ML solution 650 mg (has no administration in time range)    Or  acetaminophen (TYLENOL) suppository 650 mg (has no administration in time range)  aspirin EC tablet 81 mg (0 mg Oral Hold 04/12/21 1820)  insulin aspart (novoLOG) injection 0-15 Units (has no administration in time range)  methylPREDNISolone sodium succinate (SOLU-MEDROL) 125 mg/2 mL injection 125 mg (125 mg Intravenous Given 04/12/21 1517)  diphenhydrAMINE (BENADRYL) injection 25 mg (25 mg Intravenous Given 04/12/21 1517)  diltiazem (CARDIZEM) tablet 30 mg (30 mg Oral Given 04/12/21 1556)   stroke: mapping our early stages of recovery book (1 each Does not apply Given 04/12/21 1820)  diphenhydrAMINE (BENADRYL) injection 50 mg (50 mg Intravenous Given 04/12/21 1850)  iohexol (OMNIPAQUE) 350 MG/ML injection 100 mL (100 mLs Intravenous Contrast Given 04/12/21 2000)    ED Course/ Medical Decision Making/ A&P Clinical Course as of 04/12/21 2102  Sun Apr 12, 2021  1502 Patient and nurse report patient has had an allergic reaction to IV dye used many years ago by Dr. Olevia Perches for cardiac catheterization at that time, he had itching only He has had multiple episodes of CT dye since that time without acute reaction. Plan plain CT at this time and will premedicate. [DR]  1856 Patient with systolic blood pressure 1 70-1 80 A-fib with rate control Patient given dose of p.o. Cardizem. 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  He is on anticoagulation.  He is also having some intermittent dizziness and balance problems. Care coordinated with ophthalmology, neurology, and medicine service. Patient is hypertensive here with systolic blood pressures 1 70-80 on my initial evaluation this is discussed with neurology.  He is given p.o. Cardizem which he is on at home. Current systolic blood pressure 818 will recheck after CT.  Discussed blood pressure management with Drs. Leonel Ramsay and Leighton and aware of current elevated blood pressure.  Amount and/or Complexity of Data Reviewed Independent Historian: caregiver External Data Reviewed: notes.    Details: Discused with Dr. Alanda Slim who reports a Right branch retinal artery branch occlusion with a plaque noted Labs: ordered. Decision-making details documented in ED Course. Radiology: ordered.    Details: Discussed results of CT scan with Dr. Jeralyn Ruths.  He advises that they will review orbits on ETA He is aware of patient's contrast allergy and inability to have an MRI due to pacemaker leads. Results of initial head CT discussed with Drs. Elmer Picker via chat. Discussion of management or test interpretation with external provider(s): Discussed with Dr. Alanda Slim, on-call for ophthalmology. Discussed with Dr. Leonel Ramsay, on-call for neurology he has seen and evaluated patient Discussed with Dr. Lorin Mercy, on-call for hospitalist.  She will see for admission  Risk Prescription drug management. Decision regarding hospitalization.  Critical Care Total time providing critical care: 30-74 minutes   Final Clinical Impression(s) / ED Diagnoses Final diagnoses:  Vision changes  Retinal artery branch occlusion of right eye  Abnormal finding on CT scan    Rx / DC Orders ED Discharge Orders     None          Pattricia Boss, MD 04/12/21 2103

## 2021-04-12 NOTE — ED Notes (Signed)
Dr. Kirkpatrick at bedside 

## 2021-04-12 NOTE — H&P (Signed)
History and Physical    Patient: Kevin LEJA, [name] BXU:[id number] DOB: 08/03/1934 DOA: 04/12/2021 DOS: the patient was seen and examined on 04/12/2021 PCP: [name], [name]  Patient coming from: Home - lives with wife; NOK: Wife, [phone]   Chief Complaint: Visual impairment  HPI: Kevin Buff, [name] is a 87 y.o. male with medical history significant of AVR; BPH; chronic systolic CHF; pacemaker; CAD s/p stents; HTN; HLD; hypothyroidism; MGUS; ocular myasthenia; afib on Eliquis; and CVA (1990) presenting with .  He receives routine PT but was unable to be treated on 2/9 due to visual deficits from MG and nausea from vertigo.   He awoke this AM and felt not quite right due to vertigo - off and on for a while, but somewhat worse.  He woke up again about 9AM and noticed that part of his R eye vision was blocked by a "navy blue curtain".  He called his eye doctor and met him there about an hour later and he diagnosed a retinal artery branch occlusion.  He went home, didn't feel well, called his personal RN and she came over and brought him to the hospital.  He has been on Eliquis for a long time and is compliant with the dosing.  His dizziness has been worse than baseline intermittently for a couple of weeks but significantly worse for about the last 48 hours.  He has had "volatile" orthostatic pressures.  Gait is more unsteady and he has felt much worse, not wanting to get up as much for the last 2-3 days.  No N/W/T of arms/legs but he has had some L-sided weakness, possibly progressive from prior spinal surgery.  No dysphagia or dysarthria.    ER Course:   No MRI due to pacer, has contrast allergy.  Dizziness, ataxia for a few days, worsening balance.  Change in vision from 6 -> 9AM, seen in eye office today with retinal branch artery occlusion.  Neurology consulting, concern for ataxia.  Premedicating for CTA. Given home Cardizem.     Review of Systems: As mentioned in the history of  present illness. All other systems reviewed and are negative. Past Medical History:  Diagnosis Date   Aortic stenosis    moderate aortic stenosis   Arthritis    Benign prostatic hypertrophy    Chronic systolic heart failure (HCC)    Chronotropic incompetence with sinus node dysfunction (HCC)    Status post Guidant dual-mode, dual-pacing, dual-sensing  pacemaker   implantation now programmed to AAI with recent generator change.   Coronary artery disease    status post multiple prior percutaneous coronary interventions, microvascular angina per Dr Olevia Perches   Diverticulitis sigmoid colon recurrent    Dysfunctional autonomic nervous system    Dyspnea    Heart murmur    History of primary hypertension    Hypercoagulable state ([place])    chronically anticoagulated with coumadin   Hyperlipidemia    Hyperthyroidism    Hypothyroidism    Dr. Elyse Hsu   MGUS (monoclonal gammopathy of unknown significance) 02/17/2013   Ocular myasthenia ([place])    Osteoarthritis    Paroxysmal atrial fibrillation ([place])    DR. Lia Foyer,    Prediabetes 09/21/2017   Stroke ([place])    1990   Past Surgical History:  Procedure Laterality Date   AORTIC VALVE REPLACEMENT  03/15/2011   Procedure: AORTIC VALVE REPLACEMENT (AVR);  Surgeon: Gaye Pollack, [name];  Location: [place];  Service: Open Heart Surgery;  Laterality: N/A;  APPENDECTOMY     CARDIAC CATHETERIZATION     11   CARDIOVERSION     CARDIOVERSION  04/15/2011   Procedure: CARDIOVERSION;  Surgeon: Loralie Champagne, [name];  Location: [place];  Service: Cardiovascular;  Laterality: N/A;   CARDIOVERSION N/A 09/11/2014   Procedure: CARDIOVERSION;  Surgeon: Sueanne Margarita, [name];  Location: [facility] ENDOSCOPY;  Service: Cardiovascular;  Laterality: N/A;   CARDIOVERSION N/A 06/27/2015   Procedure: CARDIOVERSION;  Surgeon: Thayer Headings, [name];  Location: [place];  Service: Cardiovascular;  Laterality: N/A;   CARDIOVERSION N/A 07/04/2015   Procedure: CARDIOVERSION;  Surgeon:  Evans Lance, [name];  Location: [place];  Service: Cardiovascular;  Laterality: N/A;   CARDIOVERSION N/A 04/13/2017   Procedure: CARDIOVERSION;  Surgeon: Sueanne Margarita, [name];  Location: [facility] ENDOSCOPY;  Service: Cardiovascular;  Laterality: N/A;   COLONOSCOPY     CORONARY STENT INTERVENTION N/A 10/15/2019   Procedure: CORONARY STENT INTERVENTION;  Surgeon: Jettie Booze, [name];  Location: [place] CV LAB;  Service: Cardiovascular;  Laterality: N/A;   EP IMPLANTABLE DEVICE N/A 06/16/2015   Procedure: Pacemaker Implant;  Surgeon: Evans Lance, [name];  Location: [place] CV LAB;  Service: Cardiovascular;  Laterality: N/A;   hemrrhoidectomy     LEFT AND RIGHT HEART CATHETERIZATION WITH CORONARY ANGIOGRAM Bilateral 02/01/2011   Procedure: LEFT AND RIGHT HEART CATHETERIZATION WITH CORONARY ANGIOGRAM;  Surgeon: Hillary Bow, [name];  Location: [facility] CATH LAB;  Service: Cardiovascular;  Laterality: Bilateral;   LEFT HEART CATH AND CORONARY ANGIOGRAPHY N/A 10/15/2019   Procedure: LEFT HEART CATH AND CORONARY ANGIOGRAPHY;  Surgeon: Jettie Booze, [name];  Location: [place] CV LAB;  Service: Cardiovascular;  Laterality: N/A;   LEFT HEART CATH AND CORONARY ANGIOGRAPHY N/A 10/07/2020   Procedure: LEFT HEART CATH AND CORONARY ANGIOGRAPHY;  Surgeon: Sherren Mocha, [name];  Location: [place] CV LAB;  Service: Cardiovascular;  Laterality: N/A;   LUMBAR LAMINECTOMY/DECOMPRESSION MICRODISCECTOMY Right 02/23/2021   Procedure: Right Lumbar four-five microdiscectomy;  Surgeon: Eustace Moore, [name];  Location: [place];  Service: Neurosurgery;  Laterality: Right;   MAZE  03/15/2011   Procedure: MAZE;  Surgeon: Gaye Pollack, [name];  Location: [place];  Service: Open Heart Surgery;  Laterality: N/A;   PACEMAKER INSERTION  1991   Guidant PPM, most recent Generator Change by Dr Olevia Perches was 08/22/06   RIGHT/LEFT HEART CATH AND CORONARY ANGIOGRAPHY N/A 07/06/2016   Procedure: Right/Left Heart Cath and Coronary Angiography;   Surgeon: Sherren Mocha, [name];  Location: [place] CV LAB;  Service: Cardiovascular;  Laterality: N/A;   TEE WITHOUT CARDIOVERSION  04/15/2011   Procedure: TRANSESOPHAGEAL ECHOCARDIOGRAM (TEE);  Surgeon: Loralie Champagne, [name];  Location: [place];  Service: Cardiovascular;  Laterality: N/A;   TEE WITHOUT CARDIOVERSION N/A 09/11/2014   Procedure: TRANSESOPHAGEAL ECHOCARDIOGRAM (TEE);  Surgeon: Sueanne Margarita, [name];  Location: [facility] ENDOSCOPY;  Service: Cardiovascular;  Laterality: N/A;   Social History:  reports that he has never smoked. He has never used smokeless tobacco. He reports that he does not drink alcohol and does not use drugs.  Allergies  Allergen Reactions   Contrast Media [Iodinated Contrast Media] Hives   Gadolinium Derivatives Hives   Metrizamide Hives    Family History  Problem Relation Age of Onset   Heart disease Brother        Twin brother has coronary disease and recent AVR for AS   CAD Brother    Atrial fibrillation Brother    Sarcoidosis Brother    Depression  Daughter    Anorexia nervosa Daughter    Hypertension Son    Anesthesia problems Neg Hx    Hypotension Neg Hx    Malignant hyperthermia Neg Hx    Pseudochol deficiency Neg Hx     Prior to Admission medications   Medication Sig Start Date End Date Taking? Authorizing Provider  apixaban (ELIQUIS) 5 MG TABS tablet TAKE 1 TABLET BY MOUTH 2 TIMES DAILY. Patient taking differently: Take 5 mg by mouth 2 (two) times daily. 03/20/21 03/20/22 Yes Sherren Mocha, [name]  butalbital-acetaminophen-caffeine (FIORICET) [phone] MG tablet Take 1 tablet by mouth twice daily as needed for headache. 04/07/21  Yes [name], [name]  Cholecalciferol (VITAMIN D3) 2000 units capsule Take 2,000 Units by mouth daily.   Yes Provider, Historical, [name]  Coenzyme Q10 200 MG TABS Take 200 mg by mouth daily.   Yes Provider, Historical, [name]  desoximetasone (TOPICORT) 0.25 % cream Apply 1 application topically 2 (two) times daily. Patient taking  differently: Apply 1 application topically 2 (two) times daily as needed (rash or irritation). 12/31/20  Yes [name], [name]  diclofenac Sodium (VOLTAREN) 1 % GEL Apply 2 g topically 2 (two) times daily as needed (knee pain).   Yes Provider, Historical, [name]  digoxin (LANOXIN) 0.125 MG tablet Take 1 tablet (0.125 mg total) by mouth daily. 12/31/20  Yes [name], [name]  diltiazem (CARDIZEM) 30 MG tablet Take 30 mg by mouth daily as needed (sbp <130).   Yes Provider, Historical, [name]  docusate sodium (COLACE) 100 MG capsule Take 100 mg by mouth 2 (two) times daily.   Yes Provider, Historical, [name]  doxazosin (CARDURA) 4 MG tablet Take 1 tablet by mouth daily Patient taking differently: Take 4 mg by mouth at bedtime. 11/06/20  Yes   ezetimibe (ZETIA) 10 MG tablet Take 1 tablet (10 mg total) by mouth daily. Patient taking differently: Take 10 mg by mouth at bedtime. 06/20/20  Yes Evans Lance, [name]  HYDROcodone-acetaminophen (NORCO) 5-325 MG tablet Take 1 tablet by mouth 3 (three) times daily. Patient taking differently: Take 1.5 tablets by mouth in the morning and at bedtime. 03/25/21  Yes [name], [name]  LORazepam (ATIVAN) 1 MG tablet TAKE 1 TABLET BY MOUTH AT BEDTIME. MAY ALSO TAKE 1/2 TABLET DAILY AS NEEDED FOR ANXIETY. Patient taking differently: Take 1 mg by mouth at bedtime. 03/25/21 09/21/21 Yes [name], [name]  magnesium oxide (MAG-OX) 400 MG tablet Take 400 mg by mouth daily.   Yes Provider, Historical, [name]  metoprolol succinate (TOPROL-XL) 50 MG 24 hr tablet Take 1/2 tablet (25 mg) in the AM and 1 tablet (50 mg) in the PM. Patient taking differently: Take 25-50 mg by mouth See admin instructions. 25m in the morning and 551min th evening 03/25/21  Yes TaEvans LanceMD  mometasone (ELOCON) 0.1 % cream Apply 1 application topically daily. Patient taking differently: Apply 1 application topically daily as needed (rash or irritation). 12/31/20  Yes GuVirgie DadMD  Multiple  Vitamins-Minerals (MULTIVITAMIN WITH MINERALS) tablet Take 1 tablet by mouth daily.   Yes Provider, Historical, [name]  nitroGLYCERIN (NITROSTAT) 0.4 MG SL tablet PLACE 1 TABLET UNDER THE TONGUE EVERY 5 MINUTES AS NEEDED FOR CHEST PAIN. Patient taking differently: Place 0.4 mg under the tongue every 5 (five) minutes as needed for chest pain. 01/01/21 01/01/22 Yes CoSherren MochaMD  pantoprazole (PROTONIX) 40 MG tablet TAKE 1 TABLET BY MOUTH DAILY. Patient taking differently: Take 40 mg  by mouth daily. 10/21/20 10/21/21 Yes Sherren Mocha, [name]  polyethylene glycol (MIRALAX / GLYCOLAX) 17 g packet Take 17 g by mouth daily.   Yes Provider, Historical, [name]  potassium chloride SA (KLOR-CON M) 20 MEQ tablet TAKE 1 TABLET BY MOUTH DAILY. Patient taking differently: Take 20 mEq by mouth daily. 03/20/21  Yes Sherren Mocha, [name]  predniSONE (DELTASONE) 10 MG tablet Take 1 tablet (10 mg total) by mouth daily with breakfast. Patient taking differently: Take 10 mg by mouth daily with breakfast. Continuous 04/07/21  Yes [name], [name]  rosuvastatin (CRESTOR) 20 MG tablet TAKE 1 TABLET (20 MG TOTAL) BY MOUTH AT BEDTIME. Patient taking differently: Take 20 mg by mouth at bedtime. 01/20/21 01/20/22 Yes Sherren Mocha, [name]  pregabalin (LYRICA) 75 MG capsule Take 75 mg by mouth 2 (two) times daily.  03/23/21  Provider, Historical, [name]    Physical Exam: Vitals:   04/12/21 1430 04/12/21 1455 04/12/21 1554 04/12/21 1645  BP: (!) 178/128 (!) 181/101 (!) 191/90 (!) 197/92  Pulse: 88 85 73 64  Resp: 18 15 (!) 21 16  Temp: 98 F (36.7 C)     TempSrc: Oral     SpO2: 93% 96% 98% 96%   General:  Appears calm and comfortable and is in NAD, keeps right eye closed most of the time Eyes:  R eye is dilated from ophthalmology, EOMI, normal lids, iris ENT:  grossly normal hearing, lips & tongue, mmm; appropriate dentition Neck:  no LAD, masses or thyromegaly; ?L carotid bruit Cardiovascular:  Irregularly irregular, rate  controlled, no m/r/g, +click. No LE edema.  Respiratory:   CTA bilaterally with no wheezes/rales/rhonchi.  Normal respiratory effort. Abdomen:  soft, NT, ND, NABS Skin:  no rash or induration seen on limited exam Musculoskeletal:  grossly normal tone BUE/BLE, good ROM, no bony abnormality Psychiatric:  grossly normal mood and affect, speech fluent and appropriate, AOx3 Neurologic:  CN 2-12 grossly intact, moves all extremities in coordinated fashion, sensation intact   Radiological Exams on Admission: Independently reviewed - see discussion in A/P where applicable  CT HEAD WO CONTRAST  Result Date: 04/12/2021 CLINICAL DATA:  Neuro deficit, acute, stroke suspected. Loss of vision in right eye EXAM: CT HEAD WITHOUT CONTRAST TECHNIQUE: Contiguous axial images were obtained from the base of the skull through the vertex without intravenous contrast. RADIATION DOSE REDUCTION: This exam was performed according to the departmental dose-optimization program which includes automated exposure control, adjustment of the mA and/or kV according to patient size and/or use of iterative reconstruction technique. COMPARISON:  05/17/2012 FINDINGS: Brain: No evidence of acute infarction, hemorrhage, hydrocephalus, extra-axial collection or mass lesion/mass effect. 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 Scattered low-density changes within the periventricular and subcortical white matter compatible with chronic microvascular ischemic change. Mild diffuse cerebral volume loss. Vascular: Atherosclerotic calcifications involving the large vessels of the skull base. No unexpected hyperdense vessel. Skull: Normal. Negative for fracture or focal lesion. Sinuses/Orbits: There is a new extraconal lesion within the superolateral aspect of the right orbit measuring approximately 13 x 8 x 10 mm (series 3, image 14; series 5, image 21), new from prior CT. Prominence of the right superior ophthalmic vein which  appears to extend directly into the lesion (series 5, images 14-21). No additional lesions identified. Normal appearance of the intraconal fat. No fluid collection. Left orbital structures are within normal limits. Paranasal sinuses and mastoid air cells are clear. Other: None. IMPRESSION: 1. New extraconal mass lesion within the superolateral aspect  of the right orbit measuring 13 x 8 x 10 mm. Prominence of the right superior ophthalmic vein which appears to extend directly into the lesion. Appearance is nonspecific with differential including vascular malformation, venous varix, versus solid neoplasm. Further evaluation with contrast-enhanced MRI of the orbits is recommended. 2. No acute intracranial abnormality. 3. Chronic microvascular ischemic disease and cerebral volume loss. 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 Electronically Signed   By: Davina Poke D.O.   On: 04/12/2021 16:09    EKG: Independently reviewed.  Afib with rate 88; bifascicular block, NSCSLT   Labs on Admission: I have personally reviewed the available labs and imaging studies at the time of the admission.  Pertinent labs:    Glucose 203 WBC 10.5 Hgb 11.2 INR 1.2 ETOH <10    Assessment and Plan: * Retinal artery branch occlusion of right eye- (present on admission) -Patient with prior remote h/o CVA presenting with acute onset of R partial visual impairment -He was seen by ophthalmology this AM and diagnosed with branch retinal artery occlusion -However, he also has had ataxia and dizziness recently and this is concerning for CVA -Aspirin has been given to reduce stroke mortality and decrease morbidity -Will admit for CVA evaluation -Telemetry monitoring -MRI/MRA is not an option given his pacemaker -Carotid dopplers ordered; if ipsilateral carotid stenosis is detected then prompt vascular surgery consultation is needed for consideration of CEA. 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 Alanda Slim (ophthalmology) -He recommends outpatient f/u at [facility] for this issue with oculoplastics -I also called neurosurgery and confirmed that they would not handle this issue -Eliquis is currently held pending CTA results; will need resumption of Eliquis if this mass does not appear to be a significant bleeding risk, since his stroke risk is obviously quite high  Essential hypertension- (present on admission) -Allow permissive HTN for now -Treat BP only if >220/120, and then with goal of 15% reduction -Hold Dilt and Toprol XL and plan to restart in 48-72 hours -Hydralazine has been ordered for SBP >220/DBP >120  Pure hypercholesterolemia- (present on admission) -Continue Crestor, Zetia -Check lipids -Prior LDL was at goal  Chronic systolic heart failure ([place])- (present on admission) -EF was 45-50% on echo in 12/2019 -Appears to be compensated at this time -Will follow clinically  Prediabetes- (present on admission) -Last A1c was 6.2 -Current glucose is 203 -Will check A1c -Will add moderate-scale SSI  Persistent atrial fibrillation ([place])- (present on admission) -Long-standing afib -On digoxin, Dig level pending -On diltiazem and Toprol XL, held currently for permissive HTN -Continue Eliquis pending CTA  Ocular myasthenia gravis ([place])- (present on admission) -Continue Lyrica -Continue daily prednisone  Pacemaker- (present on admission) -Pacemaker is present and is not MRI compatible  IgM monoclonal gammopathy of uncertain significance- (present on admission) -Annual f/u with Dr. Benay Spice, next due in 07/2021  Hypothyroidism- (present on admission) -From Amiodarone -Last thyroid testing was normal without medications     Advance Care Planning:   Code Status: Full  Code   Consults: Neurology; ophthalmology (by telephone and PTA); PT/OT/ST/TOC team/Nutrition  DVT Prophylaxis: SCDs for now, resume Eliquis when safe  Family Communication: Personal RN was present throughout evaluation  Severity of Illness: The appropriate patient status for this patient is INPATIENT. Inpatient status is judged to be reasonable and necessary in order to provide the  required intensity of service to ensure the patient's safety. The patient's presenting symptoms, physical exam findings, and initial radiographic and laboratory data in the context of their chronic comorbidities is felt to place them at high risk for further clinical deterioration. Furthermore, it is not anticipated that the patient will be medically stable for discharge from the hospital within 2 midnights of admission.   * I certify that at the point of admission it is my clinical judgment that the patient will require inpatient hospital care spanning beyond 2 midnights from the point of admission due to high intensity of service, high risk for further deterioration and high frequency of surveillance required.*  Author: Karmen Bongo, [name] 04/12/2021 5:45 PM  For on call review [email or web address].

## 2021-04-12 NOTE — ED Notes (Signed)
Dr.Ray is aware of pt bp. Cardizem PO given at this time. Will continue to monitor.

## 2021-04-12 NOTE — Assessment & Plan Note (Addendum)
-  CT with apparent orbital mass -Dr. Lorin Mercy Discussed with Dr. Alanda Slim (ophthalmology) -Dr. Alanda Slim recommends outpatient f/u at [facility] for this issue with oculoplastics -Dr. Lorin Mercy also called neurosurgery and confirmed that they would not handle this issue -Eliquis wascurrently held pending CT of the orbits; CT of the orbits showed "1.7 x 0.9 x 1.0 cm lesion at the superolateral aspect of the postseptal right orbit, contiguous with the adjacent superior orbital vein, and most consistent with an orbital varix. Otherwise unremarkable CT of the orbits." -After further neurology discussion with interventional radiology they felt the patient will go back on his anticoagulation and this was more of an incidental finding

## 2021-04-12 NOTE — Assessment & Plan Note (Signed)
-  Continue Lyrica -Continue daily prednisone

## 2021-04-12 NOTE — Assessment & Plan Note (Addendum)
-  Long-standing afib -On digoxin, Dig level 0.4 -On diltiazem and Toprol XL, held currently for permissive HTN but resume and normalize in 5 to 7 days in the outpatient setting -Continue Eliquis

## 2021-04-12 NOTE — Assessment & Plan Note (Signed)
-  Pacemaker is present and is not MRI compatible

## 2021-04-12 NOTE — Assessment & Plan Note (Addendum)
-  Last A1c was 6.2 and repeat was 6.5 today -Current glucose is 203 -CBGs ranging from 134-230 -Further management per PCP and further blood sugar adjustments and monitoring in the outpatient setting

## 2021-04-12 NOTE — ED Triage Notes (Addendum)
Pt woke up at 6am with normal vision.  Woke up again at 9am with loss of vision in R eye- curtain going down.  Seen by ophthalmologist for exam this morning and sent to ED to R/o stroke.  No unilateral weakness.  Increased dizziness over the last 48-72 hours.  Dr. Jeanell Sparrow accessed pt on arrival to ED.  Hx of strokes.  Takes Eliquis.

## 2021-04-12 NOTE — Assessment & Plan Note (Addendum)
-  EF was 45-50% on echo in 12/2019 -Appears to be compensated at this time -Will follow clinically and have the patient follow-up with his primary cardiology team in the outpatient setting

## 2021-04-12 NOTE — Assessment & Plan Note (Addendum)
-  Allowed permissive HTN for now -Treat BP only if >220/120, and then with goal of 15% reduction -Hold Dilt and Toprol XL and plan to restart in 48-72 hours at discharge -Hydralazine has been ordered for SBP >220/DBP >120 -Last blood pressure readings 161/72 prior to discharge

## 2021-04-13 ENCOUNTER — Encounter: Payer: Medicare Other | Admitting: Physical Therapy

## 2021-04-13 ENCOUNTER — Inpatient Hospital Stay (HOSPITAL_BASED_OUTPATIENT_CLINIC_OR_DEPARTMENT_OTHER): Payer: Medicare Other

## 2021-04-13 DIAGNOSIS — H34231 Retinal artery branch occlusion, right eye: Secondary | ICD-10-CM

## 2021-04-13 DIAGNOSIS — E7800 Pure hypercholesterolemia, unspecified: Secondary | ICD-10-CM | POA: Diagnosis not present

## 2021-04-13 DIAGNOSIS — Z950 Presence of cardiac pacemaker: Secondary | ICD-10-CM | POA: Diagnosis not present

## 2021-04-13 DIAGNOSIS — I6389 Other cerebral infarction: Secondary | ICD-10-CM | POA: Diagnosis not present

## 2021-04-13 DIAGNOSIS — H3410 Central retinal artery occlusion, unspecified eye: Secondary | ICD-10-CM | POA: Diagnosis not present

## 2021-04-13 DIAGNOSIS — I5022 Chronic systolic (congestive) heart failure: Secondary | ICD-10-CM | POA: Diagnosis not present

## 2021-04-13 DIAGNOSIS — I10 Essential (primary) hypertension: Secondary | ICD-10-CM | POA: Diagnosis not present

## 2021-04-13 DIAGNOSIS — I4819 Other persistent atrial fibrillation: Secondary | ICD-10-CM | POA: Diagnosis not present

## 2021-04-13 DIAGNOSIS — G7000 Myasthenia gravis without (acute) exacerbation: Secondary | ICD-10-CM | POA: Diagnosis not present

## 2021-04-13 DIAGNOSIS — H0589 Other disorders of orbit: Secondary | ICD-10-CM

## 2021-04-13 DIAGNOSIS — H3411 Central retinal artery occlusion, right eye: Secondary | ICD-10-CM | POA: Diagnosis not present

## 2021-04-13 LAB — ECHOCARDIOGRAM COMPLETE
AR max vel: 2.67 cm2
AV Mean grad: 14 mmHg
AV Peak grad: 24 mmHg
Ao pk vel: 2.45 m/s
Area-P 1/2: 2.5 cm2
Height: 73 in
S' Lateral: 3.15 cm
Weight: 3624 oz

## 2021-04-13 LAB — BASIC METABOLIC PANEL
Anion gap: 10 (ref 5–15)
BUN: 15 mg/dL (ref 8–23)
CO2: 23 mmol/L (ref 22–32)
Calcium: 8.9 mg/dL (ref 8.9–10.3)
Chloride: 103 mmol/L (ref 98–111)
Creatinine, Ser: 1.03 mg/dL (ref 0.61–1.24)
GFR, Estimated: >60 mL/min (ref >60)
Glucose, Bld: 204 mg/dL — ABNORMAL HIGH (ref 70–99)
Potassium: 3.9 mmol/L (ref 3.5–5.1)
Sodium: 136 mmol/L (ref 135–145)

## 2021-04-13 LAB — LIPID PANEL
Cholesterol: 131 mg/dL (ref 0–200)
HDL: 62 mg/dL (ref >40)
LDL Cholesterol: 64 mg/dL (ref 0–99)
Total CHOL/HDL Ratio: 2.1 RATIO
Triglycerides: 27 mg/dL (ref <150)
VLDL: 5 mg/dL (ref 0–40)

## 2021-04-13 LAB — CBC WITH DIFFERENTIAL/PLATELET
Abs Immature Granulocytes: 0.05 10*3/uL (ref 0.00–0.07)
Basophils Absolute: 0 10*3/uL (ref 0.0–0.1)
Basophils Relative: 0 %
Eosinophils Absolute: 0 10*3/uL (ref 0.0–0.5)
Eosinophils Relative: 0 %
HCT: 33.2 % — ABNORMAL LOW (ref 39.0–52.0)
Hemoglobin: 10.3 g/dL — ABNORMAL LOW (ref 13.0–17.0)
Immature Granulocytes: 1 %
Lymphocytes Relative: 4 %
Lymphs Abs: 0.4 10*3/uL — ABNORMAL LOW (ref 0.7–4.0)
MCH: 27.8 pg (ref 26.0–34.0)
MCHC: 31 g/dL (ref 30.0–36.0)
MCV: 89.5 fL (ref 80.0–100.0)
Monocytes Absolute: 0.1 10*3/uL (ref 0.1–1.0)
Monocytes Relative: 2 %
Neutro Abs: 8.2 10*3/uL — ABNORMAL HIGH (ref 1.7–7.7)
Neutrophils Relative %: 93 %
Platelets: 209 10*3/uL (ref 150–400)
RBC: 3.71 MIL/uL — ABNORMAL LOW (ref 4.22–5.81)
RDW: 15.5 % (ref 11.5–15.5)
WBC: 8.8 10*3/uL (ref 4.0–10.5)
nRBC: 0 % (ref 0.0–0.2)

## 2021-04-13 LAB — GLUCOSE, CAPILLARY
Glucose-Capillary: 134 mg/dL — ABNORMAL HIGH (ref 70–99)
Glucose-Capillary: 230 mg/dL — ABNORMAL HIGH (ref 70–99)

## 2021-04-13 LAB — HEMOGLOBIN A1C
Hgb A1c MFr Bld: 6.5 % — ABNORMAL HIGH (ref 4.8–5.6)
Mean Plasma Glucose: 139.85 mg/dL

## 2021-04-13 LAB — DIGOXIN LEVEL: Digoxin Level: 0.4 ng/mL — ABNORMAL LOW (ref 0.8–2.0)

## 2021-04-13 MED ORDER — CILOSTAZOL 100 MG PO TABS: 100.0000 mg | ORAL_TABLET | Freq: Two times a day (BID) | ORAL | 0 refills | Status: DC

## 2021-04-13 MED ORDER — CILOSTAZOL 100 MG PO TABS
100.0000 mg | ORAL_TABLET | Freq: Two times a day (BID) | ORAL | Status: DC
  Administered 2021-04-13: 100 mg via ORAL
  Filled 2021-04-13: qty 1

## 2021-04-13 MED ORDER — APIXABAN 5 MG PO TABS
5.0000 mg | ORAL_TABLET | Freq: Two times a day (BID) | ORAL | Status: DC
  Administered 2021-04-13: 5 mg via ORAL
  Filled 2021-04-13: qty 1

## 2021-04-13 MED ORDER — CILOSTAZOL 100 MG PO TABS
100.0000 mg | ORAL_TABLET | Freq: Two times a day (BID) | ORAL | Status: DC
  Filled 2021-04-13: qty 1

## 2021-04-13 NOTE — Care Management Obs Status (Signed)
[place] NOTIFICATION   Patient Details  Name: Kevin NYLUND, MD MRN: [id number] Date of Birth: 1934/06/06   Medicare Observation Status Notification Given:  Yes    Marilu Favre, RN 04/13/2021, 3:42 PM

## 2021-04-13 NOTE — Care Management CC44 (Signed)
Condition Code 44 Documentation Completed  Patient Details  Name: Kevin FASON, MD MRN: [id number] Date of Birth: [birth year]   Condition Code 44 given:  Yes Patient signature on Condition Code 44 notice:  Yes Documentation of 2 MD's agreement:  Yes Code 44 added to claim:  Yes    Marilu Favre, RN 04/13/2021, 3:42 PM

## 2021-04-13 NOTE — Discharge Summary (Signed)
Physician Discharge Summary   Patient: [name], [name] MRN: [id number] DOB: 01-11-1935  Admit date:     04/12/2021  Discharge date: 04/13/21  Discharge Physician: [name]   PCP: [name], [name]   Recommendations at discharge:   Follow up with PCP within 1-2 weeks and repeat CBC/CMP, Mag, Phos and repeat TFTs in 4-6 weeks  Follow up with Cardiology within 1-2 weeks Follow up with Neurology within 2 months C/w outpatient PT/OT   Discharge Diagnoses: Principal Problem:   Retinal artery branch occlusion of right eye Active Problems:   Hypothyroidism   IgM monoclonal gammopathy of uncertain significance   Pacemaker   Ocular myasthenia gravis ([place])   Persistent atrial fibrillation (HCC)   Prediabetes   Chronic systolic heart failure ([place])   Pure hypercholesterolemia   Essential hypertension   Mass of orbit   Central retinal artery occlusion  Resolved Problems:   * No resolved hospital problems. [name] Course: HPI per [name] on 04/12/21  [name], [name] is a 87 y.o. male with medical history significant of AVR; BPH; chronic systolic CHF; pacemaker; CAD s/p stents; HTN; HLD; hypothyroidism; MGUS; ocular myasthenia; afib on Eliquis; and CVA (1990) presenting with .  He receives routine PT but was unable to be treated on 2/9 due to visual deficits from MG and nausea from vertigo.   He awoke this AM and felt not quite right due to vertigo - off and on for a while, but somewhat worse.  He woke up again about 9AM and noticed that part of his R eye vision was blocked by a "navy blue curtain".  He called his eye doctor and met him there about an hour later and he diagnosed a retinal artery branch occlusion.  He went home, didn't feel well, called his personal RN and she came over and brought him to the hospital.  He has been on Eliquis for a long time and is compliant with the dosing.  His dizziness has been worse than baseline intermittently for a couple  of weeks but significantly worse for about the last 48 hours.  He has had "volatile" orthostatic pressures.  Gait is more unsteady and he has felt much worse, not wanting to get up as much for the last 2-3 days.  No N/W/T of arms/legs but he has had some L-sided weakness, possibly progressive from prior spinal surgery.  No dysphagia or dysarthria.       ER Course:   No MRI due to pacer, has contrast allergy.  Dizziness, ataxia for a few days, worsening balance.  Change in vision from 6 -> 9AM, seen in eye office today with retinal branch artery occlusion.  Neurology consulting, concern for ataxia.  Premedicating for CTA. Given home Cardizem.  **Interim History He was worked up for his central retinal artery occlusion but continued to have a right eye altitudinal visual field defect on the right.  He described it as a blue curtain and described it as a horizontal descent over his right eye.  He saw his ophthalmologist who diagnosed him with a retinal artery branch occlusion and because of this he came for stroke work-up.  Stroke work-up was completed and he ended up having an echocardiogram as well as a CTA of the head and neck as well as a orbital CT which showed the patient had a right orbital venous varix.  He has been compliant with his Eliquis dose and came back to baseline.  Neurology recommending continuing Eliquis and starting the patient on Pletal 100 p.o. twice daily after discussion with his cardiologist [name].  [name] recommended continuing to maintain aggressive risk moderate condition and he recommended the patient follow-up in the stroke clinic in 2 months.  Patient will need to follow-up with PCP, cardiology and neurology in outpatient setting as he is medically stable to be discharged and PT OT recommending outpatient PT.  Assessment and Plan: * Retinal artery branch occlusion of right eye- (present on admission) -Patient with prior remote h/o CVA presenting with acute onset of R  partial visual impairment -He was seen by ophthalmology yesterday AM and diagnosed with branch retinal artery occlusion -However, he also has had ataxia and dizziness recently and this is concerning for CVA; has recently had dizziness and ataxia and this has been more of a chronic issue as well -Aspirin has been given to reduce stroke mortality and decrease morbidity but he never received this as he refused -Patient was admitted for CVA evaluation -Telemetry monitoring while hospitalized -MRI/MRA is not an option given his pacemaker -Carotid dopplers ordered and showed 1 to 39% stenosis in the left and right carotid arteries;  -Echo ordered and showed an EF of 60 to 65% with moderate LVH, left ventricular basal inferior hypokinesis as well as a dilated left atrium and a prosthetic aortic valve with no atrial shunt -Has known afib but reports strict compliance with Eliquis -Risk stratification with FLP, A1c; his hemoglobin A1c of 6.5 and his LDL of 64 -Neurology consult was appreciated and they recommended continuing anticoagulant Eliquis and starting the patient on Pletal 100 mg p.o. twice daily -PT OT recommended outpatient PT but no OT follow-up -PT/OT/ST/Nutrition Consults appreciated -Patient has been deemed medically stable after his complete work-up and will need to follow-up with neurology in outpatient setting in 2 months as well as his cardiologist [name] within 1 to 2 weeks.  Central retinal artery occlusion- (present on admission) -Seen by Ophthalmology in the outpatient setting  -worked up for CVA and Neurology evaluated and plan is delineated in the note -Follow-up with [name] in outpatient setting again and follow-up with neurology   Mass of orbit- (present on admission) -CT with apparent orbital mass -[name] Discussed with [name] (ophthalmology) -[name] recommends outpatient f/u at [facility] for this issue with oculoplastics -[name] also called neurosurgery  and confirmed that they would not handle this issue -Eliquis wascurrently held pending CT of the orbits; CT of the orbits showed "1.7 x 0.9 x 1.0 cm lesion at the superolateral aspect of the postseptal right orbit, contiguous with the adjacent superior orbital vein, and most consistent with an orbital varix. Otherwise unremarkable CT of the orbits." -After further neurology discussion with interventional radiology they felt the patient will go back on his anticoagulation and this was more of an incidental finding    Essential hypertension- (present on admission) -Allowed permissive HTN for now -Treat BP only if >220/120, and then with goal of 15% reduction -Hold Dilt and Toprol XL and plan to restart in 48-72 hours at discharge -Hydralazine has been ordered for SBP >220/DBP >120 -Last blood pressure readings 161/72 prior to discharge  Pure hypercholesterolemia- (present on admission) -Continue Crestor, Zetia -Check lipid panel and showed a total cholesterol/HDL ratio of 2.1, cholesterol level 131, HDL 62, LDL 64, triglycerides of 27, VLDL 5 -Prior LDL was at goal and is LDL 64 and neurology recommends continuing current dose of statin  Chronic systolic  heart failure ([place])- (present on admission) -EF was 45-50% on echo in 12/2019 -Appears to be compensated at this time -Will follow clinically and have the patient follow-up with his primary cardiology team in the outpatient setting  Prediabetes- (present on admission) -Last A1c was 6.2 and repeat was 6.5 today -Current glucose is 203 -CBGs ranging from 134-230 -Further management per PCP and further blood sugar adjustments and monitoring in the outpatient setting  Persistent atrial fibrillation ([place])- (present on admission) -Long-standing afib -On digoxin, Dig level 0.4 -On diltiazem and Toprol XL, held currently for permissive HTN but resume and normalize in 5 to 7 days in the outpatient setting -Continue Eliquis   Ocular myasthenia  gravis ([place])- (present on admission) -Continue Lyrica -Continue daily prednisone  Pacemaker- (present on admission) -Pacemaker is present and is not MRI compatible  IgM monoclonal gammopathy of uncertain significance- (present on admission) -Annual f/u with Dr. Benay Spice, next due in 07/2021  Hypothyroidism- (present on admission) -From Amiodarone -Last thyroid testing was normal without medications -Follow-up thyroid function tests in the next 4 to 6 weeks     Pain control - [name] Controlled Substance Reporting System database was reviewed. and patient was instructed, not to drive, operate heavy machinery, perform activities at heights, swimming or participation in water activities or provide baby-sitting services while on Pain, Sleep and Anxiety Medications; until their outpatient Physician has advised to do so again. Also recommended to not to take more than prescribed Pain, Sleep and Anxiety Medications.   Consultants: Neurology; [name] discussed with Optho  Procedures performed: ECHO, Carotid Dopplers, CTA Head and Neck and Orbits  Disposition: Home Diet recommendation:  Discharge Diet Orders (From admission, onward)     Start     Ordered   04/13/21 0000  Diet - low sodium heart healthy        04/13/21 1531           Cardiac and Carb modified diet  DISCHARGE MEDICATION: Allergies as of 04/13/2021       Reactions   Contrast Media [iodinated Contrast Media] Hives   Gadolinium Derivatives Hives   Metrizamide Hives        Medication List     TAKE these medications    butalbital-acetaminophen-caffeine 50-325-40 MG tablet Commonly known as: FIORICET Take 1 tablet by mouth twice daily as needed for headache.   cilostazol 100 MG tablet Commonly known as: PLETAL Take 1 tablet (100 mg total) by mouth 2 (two) times daily.   Coenzyme Q10 200 MG Tabs Take 200 mg by mouth daily.   desoximetasone 0.25 % cream Commonly known as: TOPICORT Apply 1  application topically 2 (two) times daily. What changed:  when to take this reasons to take this   diclofenac Sodium 1 % Gel Commonly known as: VOLTAREN Apply 2 g topically 2 (two) times daily as needed (knee pain).   digoxin 0.125 MG tablet Commonly known as: LANOXIN Take 1 tablet (0.125 mg total) by mouth daily.   diltiazem 30 MG tablet Commonly known as: CARDIZEM Take 30 mg by mouth daily as needed (sbp <130).   docusate sodium 100 MG capsule Commonly known as: COLACE Take 100 mg by mouth 2 (two) times daily.   doxazosin 4 MG tablet Commonly known as: CARDURA Take 1 tablet by mouth daily What changed:  how much to take how to take this when to take this   Eliquis 5 MG Tabs tablet Generic drug: apixaban TAKE 1 TABLET BY MOUTH 2 TIMES DAILY.  What changed: how much to take   ezetimibe 10 MG tablet Commonly known as: ZETIA Take 1 tablet (10 mg total) by mouth daily. What changed: when to take this   HYDROcodone-acetaminophen 5-325 MG tablet Commonly known as: Norco Take 1 tablet by mouth 3 (three) times daily. What changed:  how much to take when to take this   LORazepam 1 MG tablet Commonly known as: ATIVAN TAKE 1 TABLET BY MOUTH AT BEDTIME. MAY ALSO TAKE 1/2 TABLET DAILY AS NEEDED FOR ANXIETY. What changed:  how much to take how to take this when to take this   magnesium oxide 400 MG tablet Commonly known as: MAG-OX Take 400 mg by mouth daily.   metoprolol succinate 50 MG 24 hr tablet Commonly known as: TOPROL-XL Take 1/2 tablet (25 mg) in the AM and 1 tablet (50 mg) in the PM. What changed:  how much to take how to take this when to take this additional instructions   mometasone 0.1 % cream Commonly known as: ELOCON Apply 1 application topically daily. What changed:  when to take this reasons to take this   multivitamin with minerals tablet Take 1 tablet by mouth daily.   nitroGLYCERIN 0.4 MG SL tablet Commonly known as: NITROSTAT PLACE  1 TABLET UNDER THE TONGUE EVERY 5 MINUTES AS NEEDED FOR CHEST PAIN. What changed:  how much to take how to take this when to take this reasons to take this   pantoprazole 40 MG tablet Commonly known as: PROTONIX TAKE 1 TABLET BY MOUTH DAILY. What changed: how much to take   polyethylene glycol 17 g packet Commonly known as: MIRALAX / GLYCOLAX Take 17 g by mouth daily.   potassium chloride SA 20 MEQ tablet Commonly known as: KLOR-CON M TAKE 1 TABLET BY MOUTH DAILY. What changed: how much to take   predniSONE 10 MG tablet Commonly known as: DELTASONE Take 1 tablet (10 mg total) by mouth daily with breakfast. What changed: additional instructions   rosuvastatin 20 MG tablet Commonly known as: CRESTOR TAKE 1 TABLET (20 MG TOTAL) BY MOUTH AT BEDTIME. What changed: how much to take   Vitamin D3 50 MCG (2000 UT) capsule Take 2,000 Units by mouth daily.        Follow-up Information     [name], [name]. Call.   Specialty: Internal Medicine Why: Follow up within 1-2 weeks Contact information: [place] [address] [phone]         Buford Dresser, [name] .   Specialty: Cardiology Contact information: [address] [place] [address] [phone]         Evans Lance, [name]. Call.   Specialty: Cardiology Why: Follow up within 1-2 weeks Contact information: [address] [place] [address] [phone]         Garvin Fila, [name]. Call.   Specialties: Neurology, Radiology Why: Follow up within 8 weeks Contact information: [address] [place] [address] [phone]         Awanda Mink, [name]. Call.   Specialty: Ophthalmology Why: Follow up within 1-2 weeks Contact information: [place] [address] [phone]                 Discharge Exam: Filed Weights   04/12/21 2316  Weight: 102.7 kg   Vitals:   04/13/21 1151  04/13/21 1402  BP:  (!) 161/72  Pulse:  85  Resp: 14  Temp: 97.6 F (36.4 C)   SpO2:  97%   Examination: Physical Exam:   Constitutional: WN/WD overweight Caucasian male currently in NAD and appears calm and comfortable Respiratory: Slightly diminished to auscultation bilaterally, no wheezing, rales, rhonchi or crackles. Normal respiratory effort and patient is not tachypenic. No accessory muscle use.  Unlabored breathing Cardiovascular: Irregularly irregular, no murmurs / rubs / gallops. S1 and S2 auscultated.  Minimal edema Abdomen: Soft, non-tender, distended secondary body. Bowel sounds positive.  GU: Deferred.  Condition at discharge: stable  The results of significant diagnostics from this hospitalization (including imaging, microbiology, ancillary and laboratory) are listed below for reference.   Imaging Studies: CT ANGIO HEAD NECK W WO CM  Result Date: 04/12/2021 CLINICAL DATA:  Follow-up examination for stroke. EXAM: CT ANGIOGRAPHY HEAD AND NECK TECHNIQUE: Multidetector CT imaging of the head and neck was performed using the standard protocol during bolus administration of intravenous contrast. Multiplanar CT image reconstructions and MIPs were obtained to evaluate the vascular anatomy. Carotid stenosis measurements (when applicable) are obtained utilizing NASCET criteria, using the distal internal carotid diameter as the denominator. RADIATION DOSE REDUCTION: This exam was performed according to the departmental dose-optimization program which includes automated exposure control, adjustment of the mA and/or kV according to patient size and/or use of iterative reconstruction technique. CONTRAST:  143m OMNIPAQUE IOHEXOL 350 MG/ML SOLN COMPARISON:  Prior CT from earlier the same day. FINDINGS: CTA NECK FINDINGS Aortic arch: Visualized aortic arch normal in caliber with normal branch pattern. Mild atheromatous change about the arch itself. No stenosis about the origin the great  vessels. Right carotid system: Right common and internal carotid arteries patent without stenosis or dissection. Mild for age atheromatous change about the right carotid bulb. Left carotid system: Left common and internal carotid arteries patent without stenosis or dissection. Mild for age atheromatous change about the left carotid bulb. Vertebral arteries: Both vertebral arteries arise from the subclavian arteries. 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 Right vertebral artery patent without stenosis or dissection. Skeleton: No discrete or worrisome osseous lesions. Mild cervical spondylosis for age. Other neck: No other acute soft tissue abnormality within the neck. Upper chest: Visualized upper chest demonstrates no acute finding. Left-sided pacemaker/AICD noted. Median sternotomy wires noted as well. Review of the MIP images confirms the above findings CTA HEAD FINDINGS Anterior circulation: Petrous segments patent bilaterally. Atheromatous change within the carotid siphons without hemodynamically significant stenosis. Right A1 patent. Left A1 hypoplastic and/or absent. Short-segment azygous ACA noted. ACAs patent distally without stenosis. No M1 stenosis or occlusion. Normal MCA bifurcations. Distal MCA branches perfused and symmetric. Distal small vessel atheromatous irregularity noted. Posterior circulation: Right V4 segment patent to the vertebrobasilar junction without stenosis. Right PICA patent. Irregular attenuated flow seen within the left V4 segment, likely retrograde in nature across the vertebrobasilar junction. Multifocal moderate to severe left V4 stenoses noted. Left PICA grossly patent at its origin, although is not seen distally. Basilar diminutive but patent to its distal aspect without stenosis. Superior cerebellar arteries patent bilaterally. Right PCA supplied via the basilar as well as a small right posterior communicating artery. Fetal type  origin left PCA. Both PCAs patent to their distal aspects without stenosis. Venous sinuses: Grossly patent allowing for timing the contrast bolus. Anatomic variants: Hypoplastic/absent left A1. Azygous ACA. No aneurysm. Review of the MIP images confirms the above findings IMPRESSION: 1. Negative CTA for acute large vessel occlusion or other acute vascular abnormality. 2. Occlusion  of the left vertebral artery at its origin, which remains occluded within the neck. Irregular with irregular attenuated flow within the intracranial left V4 segment, likely retrograde in nature across the vertebrobasilar junction. Multifocal moderate to severe left V4 stenoses. 3. Additional mild for age atheromatous change elsewhere about the major arterial vasculature of the head and neck. No other hemodynamically significant or correctable stenosis. Electronically Signed   By: Jeannine Boga M.D.   On: 04/12/2021 21:17   CT HEAD WO CONTRAST  Result Date: 04/12/2021 CLINICAL DATA:  Neuro deficit, acute, stroke suspected. Loss of vision in right eye EXAM: CT HEAD WITHOUT CONTRAST TECHNIQUE: Contiguous axial images were obtained from the base of the skull through the vertex without intravenous contrast. RADIATION DOSE REDUCTION: This exam was performed according to the departmental dose-optimization program which includes automated exposure control, adjustment of the mA and/or kV according to patient size and/or use of iterative reconstruction technique. COMPARISON:  05/17/2012 FINDINGS: Brain: No evidence of acute infarction, hemorrhage, hydrocephalus, extra-axial collection or mass lesion/mass effect. 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 Scattered low-density changes within the periventricular and subcortical white matter compatible with chronic microvascular ischemic change. Mild diffuse cerebral volume loss. Vascular: Atherosclerotic calcifications involving the large vessels of the skull base. No  unexpected hyperdense vessel. Skull: Normal. Negative for fracture or focal lesion. Sinuses/Orbits: There is a new extraconal lesion within the superolateral aspect of the right orbit measuring approximately 13 x 8 x 10 mm (series 3, image 14; series 5, image 21), new from prior CT. Prominence of the right superior ophthalmic vein which appears to extend directly into the lesion (series 5, images 14-21). No additional lesions identified. Normal appearance of the intraconal fat. No fluid collection. Left orbital structures are within normal limits. Paranasal sinuses and mastoid air cells are clear. Other: None. IMPRESSION: 1. 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 Prominence of the right superior ophthalmic vein which appears to extend directly into the lesion. Appearance is nonspecific with differential including vascular malformation, venous varix, versus solid neoplasm. Further evaluation with contrast-enhanced MRI of the orbits is recommended. 2. No acute intracranial abnormality. 3. Chronic microvascular ischemic disease and cerebral volume loss. 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 Electronically Signed   By: Davina Poke D.O.   On: 04/12/2021 16:09   ECHOCARDIOGRAM COMPLETE  Result Date: 04/13/2021    ECHOCARDIOGRAM REPORT   Patient Name:   DARYN HICKS Date of Exam: 04/13/2021 Medical Rec #:  [id number]        Height:       73.0 in Accession #:    [id number]       Weight:       226.5 lb Date of Birth:  11/12/1934         BSA:          2.268 m Patient Age:    3 years         BP:           154/73 mmHg Patient Gender: M                HR:           66 bpm. Exam Location:  Inpatient Procedure: 2D Echo Indications:    Stroke  History:        Patient has prior history of Echocardiogram examinations, most  recent 01/18/2020. CAD, Arrythmias:Atrial Fibrillation; Risk                 Factors:Hypertension.   Sonographer:    Jefferey Pica Referring Phys: [place]  1. Left ventricular ejection fraction, by estimation, is 60 to 65%. The left ventricle has normal function. The left ventricle demonstrates regional wall motion abnormalities. Basal inferior hypokinesis. There is moderate left ventricular hypertrophy. Left ventricular diastolic parameters are indeterminate.  2. Right ventricular systolic function is mildly reduced. The right ventricular size is mildly enlarged. There is mildly elevated pulmonary artery systolic pressure. The estimated right ventricular systolic pressure is 93.8 mmHg.  3. Left atrial size was severely dilated.  4. The mitral valve is degenerative. Trivial mitral valve regurgitation. No evidence of mitral stenosis. Moderate mitral annular calcification.  5. There is a 25 mm Edwards Magna-Ease valve present in the aortic position.     Aortic valve regurgitation is not visualized. Aortic valve mean gradient measures 14.0 mmHg. Vmax 2.5 m/s, EOA 2.7 cm^2, DI 0.7. Echo findings are consistent with normal structure and function of the aortic valve prosthesis.  6. The inferior vena cava is dilated in size with >50% respiratory variability, suggesting right atrial pressure of 8 mmHg. FINDINGS  Left Ventricle: Left ventricular ejection fraction, by estimation, is 60 to 65%. The left ventricle has normal function. The left ventricle demonstrates regional wall motion abnormalities. The left ventricular internal cavity size was normal in size. There is moderate left ventricular hypertrophy. Left ventricular diastolic parameters are indeterminate. Right Ventricle: The right ventricular size is mildly enlarged. No increase in right ventricular wall thickness. Right ventricular systolic function is mildly reduced. There is mildly elevated pulmonary artery systolic pressure. The tricuspid regurgitant  velocity is 2.90 m/s, and with an assumed right atrial pressure of 8 mmHg, the  estimated right ventricular systolic pressure is 10.1 mmHg. Left Atrium: Left atrial size was severely dilated. Right Atrium: Right atrial size was normal in size. Pericardium: There is no evidence of pericardial effusion. Mitral Valve: The mitral valve is degenerative in appearance. Moderate mitral annular calcification. Trivial mitral valve regurgitation. No evidence of mitral valve stenosis. Tricuspid Valve: The tricuspid valve is normal in structure. Tricuspid valve regurgitation is trivial. Aortic Valve: The aortic valve has been repaired/replaced. Aortic valve regurgitation is not visualized. Aortic valve mean gradient measures 14.0 mmHg. Aortic valve peak gradient measures 24.0 mmHg. There is a 25 mm Edwards Magna-Ease valve present in the aortic position. Echo findings are consistent with normal structure and function of the aortic valve prosthesis. Pulmonic Valve: The pulmonic valve was not well visualized. Pulmonic valve regurgitation is trivial. Aorta: The aortic root and ascending aorta are structurally normal, with no evidence of dilitation. Venous: The inferior vena cava is dilated in size with greater than 50% respiratory variability, suggesting right atrial pressure of 8 mmHg. IAS/Shunts: No atrial level shunt detected by color flow Doppler.  LEFT VENTRICLE PLAX 2D LVIDd:         5.10 cm LVIDs:         3.15 cm LV PW:         1.55 cm LV IVS:        1.45 cm LVOT diam:     2.20 cm LV SV:         132 LV SV Index:   58 LVOT Area:     3.80 cm  RIGHT VENTRICLE            IVC RV  S prime:     7.43 cm/s  IVC diam: 2.60 cm TAPSE (M-mode): 1.6 cm LEFT ATRIUM              Index        RIGHT ATRIUM           Index LA diam:        5.40 cm  2.38 cm/m   RA Area:     19.40 cm LA Vol (A2C):   58.3 ml  25.70 ml/m  RA Volume:   47.20 ml  20.81 ml/m LA Vol (A4C):   133.0 ml 58.63 ml/m LA Biplane Vol: 93.2 ml  41.08 ml/m  AORTIC VALVE                  PULMONIC VALVE AV Area (Vmax): 2.67 cm      PV Vmax:       0.91  m/s AV Vmax:        245.00 cm/s   PV Peak grad:  3.3 mmHg AV Peak Grad:   24.0 mmHg AV Mean Grad:   14.0 mmHg LVOT Vmax:      172.00 cm/s LVOT Vmean:     112.000 cm/s LVOT VTI:       0.347 m  AORTA Ao Root diam: 3.80 cm Ao Asc diam:  3.70 cm MITRAL VALVE               TRICUSPID VALVE MV Area (PHT): 2.50 cm    TR Peak grad:   33.6 mmHg MV Decel Time: 303 msec    TR Vmax:        290.00 cm/s MV E velocity: 89.70 cm/s                            SHUNTS                            Systemic VTI:  0.35 m                            Systemic Diam: 2.20 cm Oswaldo Milian [name] Electronically signed by Oswaldo Milian [name] Signature Date/Time: 04/13/2021/12:34:56 PM    Final    VAS US CAROTID (at [facility] and [facility] only)  Result Date: 04/13/2021 Carotid Arterial Duplex Study Patient Name:  XAN INGRAHAM  Date of Exam:   04/13/2021 Medical Rec #: [id number]         Accession #:    [id number] Date of Birth: [birth year]          Patient Gender: M Patient Age:   70 years Exam Location:  [facility] Procedure:      VAS US CAROTID Referring Phys: [name] --------------------------------------------------------------------------------  Indications:      CVA. Risk Factors:     Hypertension, hyperlipidemia, coronary artery disease, prior                   CVA. Comparison Study: no prior Performing Technologist: Archie Patten RVS  Examination Guidelines: A complete evaluation includes B-mode imaging, spectral Doppler, color Doppler, and power Doppler as needed of all accessible portions of each vessel. Bilateral testing is considered an integral part of a complete examination. Limited examinations for reoccurring indications may be performed as noted.  Right Carotid Findings: +----------+--------+--------+--------+------------------+--------+             PSV cm/s  EDV cm/s Stenosis Plaque Description Comments  +----------+--------+--------+--------+------------------+--------+  CCA Prox   96       13                 heterogenous                 +----------+--------+--------+--------+------------------+--------+  CCA Distal 73       16                heterogenous                 +----------+--------+--------+--------+------------------+--------+  ICA Prox   60       19       1-39%    heterogenous                 +----------+--------+--------+--------+------------------+--------+  ICA Distal 69       19                                             +----------+--------+--------+--------+------------------+--------+  ECA        133      12                                             +----------+--------+--------+--------+------------------+--------+ +----------+--------+-------+--------+-------------------+             PSV cm/s EDV cms Describe Arm Pressure (mmHG)  +----------+--------+-------+--------+-------------------+  Subclavian 53                                             +----------+--------+-------+--------+-------------------+ +---------+--------+--+--------+--+---------+  Vertebral PSV cm/s 56 EDV cm/s 18 Antegrade  +---------+--------+--+--------+--+---------+  Left Carotid Findings: +----------+--------+--------+--------+------------------+--------+             PSV cm/s EDV cm/s Stenosis Plaque Description Comments  +----------+--------+--------+--------+------------------+--------+  CCA Prox   79       14                heterogenous                 +----------+--------+--------+--------+------------------+--------+  CCA Distal 86       15                heterogenous                 +----------+--------+--------+--------+------------------+--------+  ICA Prox   46       12       1-39%    heterogenous                 +----------+--------+--------+--------+------------------+--------+  ICA Distal 76       20                                             +----------+--------+--------+--------+------------------+--------+  ECA        114      24                                              +----------+--------+--------+--------+------------------+--------+ +----------+--------+--------+--------+-------------------+  PSV cm/s EDV cm/s Describe Arm Pressure (mmHG)  +----------+--------+--------+--------+-------------------+  Subclavian 90                                              +----------+--------+--------+--------+-------------------+ +---------+--------+--------+--------+  Vertebral PSV cm/s EDV cm/s occluded  +---------+--------+--------+--------+   Summary: Right Carotid: Velocities in the right ICA are consistent with a 1-39% stenosis. Left Carotid: Velocities in the left ICA are consistent with a 1-39% stenosis. Vertebrals: Right vertebral artery demonstrates antegrade flow. Left vertebral             artery demonstrates an occlusion. *See table(s) above for measurements and observations.     Preliminary    CT ORBITS W CONTRAST  Result Date: 04/12/2021 CLINICAL DATA:  Initial evaluation for benign neoplasm of eye. EXAM: CT ORBITS WITH CONTRAST TECHNIQUE: Multidetector CT images was performed according to the standard protocol following intravenous contrast administration. RADIATION DOSE REDUCTION: This exam was performed according to the departmental dose-optimization program which includes automated exposure control, adjustment of the mA and/or kV according to patient size and/or use of iterative reconstruction technique. CONTRAST:  138m OMNIPAQUE IOHEXOL 350 MG/ML SOLN COMPARISON:  No pertinent prior exam. FINDINGS: Orbits: Globes are symmetric in size with normal appearance and morphology. Extra-ocular muscles symmetric and normal. Optic nerves within normal limits. No abnormality about the orbital apices. Intraconal and extraconal fat well-maintained. Lacrimal glands normal. Smoothly marginated soft tissue lesion at the superolateral aspect of the postseptal right orbit measures 1.7 x 0.9 x 1.0 cm (series 6, image 15). This appears to be contiguous with the adjacent  superior orbital vein which is mildly enlarged and hyperdense. Constellation of features suggesting orbital varix. No surrounding inflammatory changes. Lesion abuts the right superior rectus muscle without significant regional mass effect. No visible evidence of cavernous sinus thrombosis. Visible paranasal sinuses: Mild scattered mucoperiosteal thickening noted within the ethmoidal air cells. Visualized paranasal sinuses are otherwise largely clear. Visualized mastoids and middle ear cavities are clear. Soft tissues: Unremarkable. Osseous: Unremarkable. Limited intracranial: Unremarkable, better evaluated on corresponding CTA. IMPRESSION: 1. 1.7 x 0.9 x 1.0 cm lesion at the superolateral aspect of the postseptal right orbit, contiguous with the adjacent superior orbital vein, and most consistent with an orbital varix. 2. Otherwise unremarkable CT of the orbits. Electronically Signed   By: BJeannine BogaM.D.   On: 04/12/2021 21:38    Microbiology: Results for orders placed or performed during the hospital encounter of 04/12/21  Resp Panel by RT-PCR (Flu A&B, Covid) Nasopharyngeal Swab     Status: None   Collection Time: 04/12/21  2:41 PM   Specimen: Nasopharyngeal Swab; Nasopharyngeal(NP) swabs in vial transport medium  Result Value Ref Range Status   SARS Coronavirus 2 by RT PCR NEGATIVE NEGATIVE Final    Comment: (NOTE) SARS-CoV-2 target nucleic acids are NOT DETECTED.  The SARS-CoV-2 RNA is generally detectable in upper respiratory specimens during the acute phase of infection. The lowest concentration of SARS-CoV-2 viral copies this assay can detect is 138 copies/mL. A negative result does not preclude SARS-Cov-2 infection and should not be used as the sole basis for treatment or other patient management decisions. A negative result may occur with  improper specimen collection/handling, submission of specimen other than nasopharyngeal swab, presence of viral mutation(s) within  the areas targeted by this assay, and inadequate number of viral copies(<138 copies/mL). A negative result  must be combined with clinical observations, patient history, and epidemiological information. The expected result is Negative.  Fact Sheet for Patients:  EntrepreneurPulse.com.au  Fact Sheet for Healthcare Providers:  IncredibleEmployment.be  This test is no t yet approved or cleared by the [place] FDA and  has been authorized for detection and/or diagnosis of SARS-CoV-2 by FDA under an Emergency Use Authorization (EUA). This EUA will remain  in effect (meaning this test can be used) for the duration of the COVID-19 declaration under Section 564(b)(1) of the Act, 21 U.S.C.section 360bbb-3(b)(1), unless the authorization is terminated  or revoked sooner.       Influenza A by PCR NEGATIVE NEGATIVE Final   Influenza B by PCR NEGATIVE NEGATIVE Final    Comment: (NOTE) The Xpert Xpress SARS-CoV-2/FLU/RSV plus assay is intended as an aid in the diagnosis of influenza from Nasopharyngeal swab specimens and should not be used as a sole basis for treatment. Nasal washings and aspirates are unacceptable for Xpert Xpress SARS-CoV-2/FLU/RSV testing.  Fact Sheet for Patients: EntrepreneurPulse.com.au  Fact Sheet for Healthcare Providers: IncredibleEmployment.be  This test is not yet approved or cleared by the [place] FDA and has been authorized for detection and/or diagnosis of SARS-CoV-2 by FDA under an Emergency Use Authorization (EUA). This EUA will remain in effect (meaning this test can be used) for the duration of the COVID-19 declaration under Section 564(b)(1) of the Act, 21 U.S.C. section 360bbb-3(b)(1), unless the authorization is terminated or revoked.  Performed at [facility], [place] [address] [place], [place] [address]    *Note: Due to a large number of results and/or  encounters for the requested time period, some results have not been displayed. A complete set of results can be found in Results Review.   Labs: CBC: Recent Labs  Lab 04/12/21 1429 04/12/21 1441 04/13/21 0109  WBC 10.5  --  8.8  NEUTROABS 9.5*  --  8.2*  HGB 11.2* 11.9* 10.3*  HCT 36.6* 35.0* 33.2*  MCV 91.0  --  89.5  PLT 212  --  989   Basic Metabolic Panel: Recent Labs  Lab 04/12/21 1429 04/12/21 1441 04/13/21 0109  NA 138 139 136  K 4.4 4.4 3.9  CL 101 103 103  CO2 26  --  23  GLUCOSE 203* 195* 204*  BUN '16 16 15  ' CREATININE 1.11 1.00 1.03  CALCIUM 9.1  --  8.9   Liver Function Tests: Recent Labs  Lab 04/12/21 1429  AST 26  ALT 19  ALKPHOS 82  BILITOT 0.6  PROT 7.5  ALBUMIN 4.0   CBG: Recent Labs  Lab 04/12/21 1431 04/13/21 0748 04/13/21 1147  GLUCAP 183* 134* 230*    Discharge time spent: greater than 30 minutes.  Signed: Raiford Noble, DO [facility] 04/13/2021

## 2021-04-13 NOTE — Evaluation (Signed)
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 CT revealed no acute intracranial abnormalities, but did reveal new mass in the R orbit. 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 PMH includes: vertigo, aortic stenosis, CHF, pacemaker placement, CAD, HTN, HLD, osteoarthritis, ocular myasthenia gravis, and stroke.   Clinical Impression   Pt functions independently at baseline and was participating in [place] following back surgery in December. Pt did not demonstrate any LOB and is compensating well for vision changes and baseline intermittent diplopia during basic ADLs and mobility. Overall pt is functioning at up to a supervision level for safety. No further OT needs.      Recommendations for follow up therapy are one component of a multi-disciplinary discharge planning process, led by the attending physician.  Recommendations may be updated based on patient status, additional functional criteria and insurance authorization.   Follow Up Recommendations  No OT follow up    Assistance Recommended at Discharge    Patient can return home with the following Assistance with cooking/housework;Assist for transportation    Functional Status Assessment  Patient has had a recent decline in their functional status and demonstrates the ability to make significant improvements in function in a reasonable and predictable amount of time.  Equipment Recommendations  None recommended by OT    Recommendations for Other Services       Precautions / Restrictions Precautions Precautions: Fall Precaution Comments: pt with 0 falls in last 6 months Restrictions Weight Bearing Restrictions: No      Mobility Bed Mobility Overal bed mobility: Modified Independent                   Transfers Overall transfer level: Needs assistance Equipment used: None Transfers: Sit to/from Stand Sit to Stand: Supervision           General transfer comment: no overt LOB, supervised for safety      Balance Overall balance assessment: Needs assistance   Sitting balance-Leahy Scale: Good       Standing balance-Leahy Scale: Fair                             ADL either performed or assessed with clinical judgement   ADL Overall ADL's : Modified independent                                       General ADL Comments: pt using compensatory strategies for visual field loss     Vision Ability to See in Adequate Light: 1 Impaired Additional Comments: dysconjugate gaze, new R eye upper quadrant field loss, intermittent diplopia--longstanding. Pt has had prism glasses in the past which did not help. He closes his R eye when experiencing diplopia.     Perception     Praxis      Pertinent Vitals/Pain Pain Assessment Pain Assessment: Faces Faces Pain Scale: Hurts little more Pain Location: head, neck Pain Descriptors / Indicators: Aching, Discomfort Pain Intervention(s): RN gave pain meds during session     Hand Dominance Right   Extremity/Trunk Assessment Upper Extremity Assessment Upper Extremity Assessment: Overall WFL for tasks assessed   Lower Extremity Assessment Lower Extremity Assessment:  Defer to PT evaluation   Cervical / Trunk Assessment Cervical / Trunk Assessment: Normal   Communication Communication Communication: No difficulties   Cognition Arousal/Alertness: Awake/alert Behavior During Therapy: WFL for tasks assessed/performed Overall Cognitive Status: Within Functional Limits for tasks assessed                                 General Comments: pt is well aware of his deficits     General Comments  VSS on RA    Exercises     Shoulder Instructions      Home Living Family/patient expects  to be discharged to:: Private residence Living Arrangements: Spouse/significant other;Other (Comment) (family RN) Available Help at Discharge: Family;Personal care attendant;Available 24 hours/day Type of Home: Independent living facility Home Access: Level entry     Home Layout: One level     Bathroom Shower/Tub: Occupational psychologist: Standard     Home Equipment: Rollator (4 wheels);Shower seat - built in;Grab bars - tub/shower;Grab bars - toilet;Cane - single point          Prior Functioning/Environment Prior Level of Function : Independent/Modified Independent             Mobility Comments: pt receiving OPPT PT, no falls, no use of DME ADLs Comments: pt and RN report independent with ADLs and IADLs        OT Problem List:        OT Treatment/Interventions:      OT Goals(Current goals can be found in the care plan section)    OT Frequency:      Co-evaluation              AM-PAC OT "6 Clicks" Daily Activity     Outcome Measure Help from another person eating meals?: None Help from another person taking care of personal grooming?: None Help from another person toileting, which includes using toliet, bedpan, or urinal?: None Help from another person bathing (including washing, rinsing, drying)?: None Help from another person to put on and taking off regular upper body clothing?: None Help from another person to put on and taking off regular lower body clothing?: None 6 Click Score: 24   End of Session    Activity Tolerance: Patient tolerated treatment well Patient left: in bed;with call bell/phone within reach;with family/visitor present;with nursing/sitter in room  OT Visit Diagnosis: Pain                Time: 1415-1430 OT Time Calculation (min): 15 min Charges:  OT General Charges $OT Visit: 1 Visit OT Evaluation $OT Eval Low Complexity: 1 Low  Nestor Lewandowsky, OTR/L Acute Rehabilitation Services Pager: [phone] Office:  [phone]  Malka So 04/13/2021, 3:13 PM

## 2021-04-13 NOTE — Progress Notes (Signed)
Echocardiogram 2D Echocardiogram has been performed.  Jefferey Pica 04/13/2021, 10:03 AM

## 2021-04-13 NOTE — Hospital Course (Signed)
HPI per Dr. 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  He receives routine PT but was unable to be treated on 2/9 due to visual deficits from MG and nausea from vertigo.   He awoke this AM and felt not quite right due to vertigo - off and on for a while, but somewhat worse.  He woke up again about 9AM and noticed that part of his R eye vision was blocked by a "navy blue curtain".  He called his eye doctor and met him there about an hour later and he diagnosed a retinal artery branch occlusion.  He went home, didn't feel well, called his personal RN and she came over and brought him to the hospital.  He has been on Eliquis for a long time and is compliant with the dosing.  His dizziness has been worse than baseline intermittently for a couple of weeks but significantly worse for about the last 48 hours.  He has had "volatile" orthostatic pressures.  Gait is more unsteady and he has felt much worse, not wanting to get up as much for the last 2-3 days.  No N/W/T of arms/legs but he has had some L-sided weakness, possibly progressive from prior spinal surgery.  No dysphagia or dysarthria. °  °  °  °ER Course:   No MRI due to pacer, has contrast allergy.  Dizziness, ataxia for a few days, worsening balance.  Change in vision from 6 -> 9AM, seen in eye office today with retinal branch artery occlusion.  Neurology consulting, concern for ataxia.  Premedicating for CTA. Given home Cardizem. ° °**Interim History °He was worked up for his central retinal artery occlusion but continued to have a right eye altitudinal visual field defect on the right.  He described it as a blue curtain and described it as a horizontal descent over his right eye.  He saw his ophthalmologist who diagnosed him with a retinal  artery branch occlusion and because of this he came for stroke work-up.  Stroke work-up was completed and he ended up having an echocardiogram as well as a CTA of the head and neck as well as a orbital CT which showed the patient had a right orbital venous varix.  He has been compliant with his Eliquis dose and came back to baseline.  Neurology recommending continuing Eliquis and starting the patient on Pletal 100 p.o. twice daily after discussion with his cardiologist Dr. Taylor.  Dr. Sethi recommended continuing to maintain aggressive risk moderate condition and he recommended the patient follow-up in the stroke clinic in 2 months.  Patient will need to follow-up with PCP, cardiology and neurology in outpatient setting as he is medically stable to be discharged and PT OT recommending outpatient PT. °

## 2021-04-13 NOTE — Assessment & Plan Note (Signed)
-  Seen by Ophthalmology in the outpatient setting  -worked up for CVA and Neurology evaluated and plan is delineated in the note -Follow-up with Dr. Alanda Slim in outpatient setting again and follow-up with neurology

## 2021-04-13 NOTE — Progress Notes (Signed)
°   04/12/21 2323  Assess: MEWS Score  Temp 98.2 F (36.8 C)  BP (!) 189/109  Pulse Rate 60  ECG Heart Rate 67  Resp 18  Level of Consciousness Alert  Assess: MEWS Score  MEWS Temp 0  MEWS Systolic 0  MEWS Pulse 0  MEWS RR 0  MEWS LOC 0  MEWS Score 0  MEWS Score Color Green  Assess: if the MEWS score is Yellow or Red  Were vital signs taken at a resting state? Yes  Focused Assessment No change from prior assessment  Early Detection of Sepsis Score *See Row Information* Low  MEWS guidelines implemented *See Row Information* Yes  Treat  MEWS Interventions Escalated (See documentation below)  Pain Scale 0-10  Pain Score 4  Pain Type Chronic pain  Pain Location Back  Pain Orientation Lower  Pain Descriptors / Indicators Aching;Discomfort  Pain Frequency Intermittent  Pain Onset On-going  Patients Stated Pain Goal 2  Pain Intervention(s) Medication (See eMAR)  Multiple Pain Sites No  Take Vital Signs  Increase Vital Sign Frequency  Yellow: Q 2hr X 2 then Q 4hr X 2, if remains yellow, continue Q 4hrs  Escalate  MEWS: Escalate Yellow: discuss with charge nurse/RN and consider discussing with provider and RRT  Notify: Charge Nurse/RN  Name of Charge Nurse/RN Notified Jill, RN  Date Charge Nurse/RN Notified 04/12/21  Time Charge Nurse/RN Notified 2345  Document  Patient Outcome Other (Comment) (Pt remains stable)  Progress note created (see row info) Yes

## 2021-04-13 NOTE — Progress Notes (Signed)
Initial Nutrition Assessment  DOCUMENTATION CODES:  Not applicable  INTERVENTION:  Recommend liberalizing to regular diet for advanced age Encourage PO intake MVI with minerals daily.   NUTRITION DIAGNOSIS:  Inadequate oral intake related to decreased appetite as evidenced by per patient/family report.  GOAL:  Patient will meet greater than or equal to 90% of their needs  MONITOR:  PO intake  REASON FOR ASSESSMENT:  Consult Assessment of nutrition requirement/status  ASSESSMENT:  87 year old male with history of atrial fibrillation, CAD, HLD, CVA, CHF, and HTN presented to ED from ophthalmologist office with branch retinal artery occlusion.   Pt reported on admission several days of feeling weaker and more off balance. Admitted for CVA workup.  Pt resting in bed at the time of assessment, family at bedside assists with interview. Pt reports that appetite has always been great no concerns with his intake at this time.   Lunch tray at bedside untouched. Pt does state he is eating less than he does at home, but pt reports that he typically grazes during the day. Will consume premier protein drinks a few times a week.  Stable weight hx and no muscle or fat deficits present on exam. Pt anticipates being discharged later today.   Nutritionally Relevant Medications: Scheduled Meds:  digoxin  0.125 mg Oral Daily   docusate sodium  100 mg Oral BID   doxazosin  4 mg Oral QHS   ezetimibe  10 mg Oral QHS   insulin aspart  0-15 Units Subcutaneous TID WC   magnesium oxide  400 mg Oral Daily   pantoprazole  40 mg Oral Daily   polyethylene glycol  17 g Oral Daily   predniSONE  10 mg Oral Q breakfast   rosuvastatin  20 mg Oral QHS   Labs Reviewed: SBG ranges from 134-230 mg/dL over the last 24 hours HgbA1c 6.5% (2/12)  NUTRITION - FOCUSED PHYSICAL EXAM: Flowsheet Row Most Recent Value  Orbital Region No depletion  Upper Arm Region No depletion  Thoracic and Lumbar Region No  depletion  Buccal Region No depletion  Temple Region No depletion  Clavicle Bone Region No depletion  Clavicle and Acromion Bone Region No depletion  Scapular Bone Region No depletion  Dorsal Hand No depletion  Patellar Region No depletion  Anterior Thigh Region No depletion  Posterior Calf Region No depletion  Edema (RD Assessment) None  Hair Reviewed  Eyes Reviewed  Mouth Reviewed  Skin Reviewed  Nails Reviewed   Diet Order:   Diet Order             Diet Heart Room service appropriate? Yes; Fluid consistency: Thin  Diet effective ____                   EDUCATION NEEDS:  No education needs have been identified at this time  Skin:  Skin Assessment: Reviewed RN Assessment  Last BM:  2/12  Height:  Ht Readings from Last 1 Encounters:  04/12/21 6\' 1"  (1.854 m)    Weight:  Wt Readings from Last 1 Encounters:  04/12/21 102.7 kg    Ideal Body Weight:  83.6 kg  BMI:  Body mass index is 29.88 kg/m.  Estimated Nutritional Needs:  Kcal:  2000-2200 kcal/d Protein:  100-115 g/d Fluid:  >2.2 L/d   Ranell Patrick, RD, LDN Clinical Dietitian RD pager # available in [place]  After hours/weekend pager # available in [facility]

## 2021-04-13 NOTE — Progress Notes (Addendum)
STROKE TEAM PROGRESS NOTE   INTERVAL HISTORY Patient is seen in his room with his RN at the bedside.  Yesterday, he awoke at 0900 with a right eye altitudinal visual field deficit on the right.  The patient stated that it appeared as if a blue curtain had descended horizontally over his right eye.  He saw his ophthalmologist and was diagnosed with a retinal artery branch occlusion.  He then came to the emergency department for stroke workup.  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  But he denies any proptosis, eye pain or double vision. He states he has been compliant with Eliquis and did not miss any dose.  His personal nurse was present at the bedside concurs. Vitals:   04/13/21 0547 04/13/21 0617 04/13/21 0715 04/13/21 1005  BP: (!) 172/85 (!) 152/82 (!) 154/73   Pulse: (!) 59 62 (!) 59 79  Resp:   16   Temp:   97.7 F (36.5 C)   TempSrc:   Oral   SpO2: 95% 94% 95%   Weight:      Height:       CBC:  Recent Labs  Lab 04/12/21 1429 04/12/21 1441 04/13/21 0109  WBC 10.5  --  8.8  NEUTROABS 9.5*  --  8.2*  HGB 11.2* 11.9* 10.3*  HCT 36.6* 35.0* 33.2*  MCV 91.0  --  89.5  PLT 212  --  301   Basic Metabolic Panel:  Recent Labs  Lab 04/12/21 1429 04/12/21 1441 04/13/21 0109  NA 138 139 136  K 4.4 4.4 3.9  CL 101 103 103  CO2 26  --  23  GLUCOSE 203* 195* 204*  BUN 16 16 15   CREATININE 1.11 1.00 1.03  CALCIUM 9.1  --  8.9   Lipid Panel:  Recent Labs  Lab 04/13/21 0109  CHOL 131  TRIG 27  HDL 62  CHOLHDL 2.1  VLDL 5  LDLCALC 64   HgbA1c:  Recent Labs  Lab 04/13/21 0109  HGBA1C 6.5*   Urine Drug Screen:  Recent Labs  Lab 04/12/21 1441  LABOPIA POSITIVE*  COCAINSCRNUR NONE DETECTED  LABBENZ NONE DETECTED  AMPHETMU NONE DETECTED  THCU NONE DETECTED  LABBARB POSITIVE*    Alcohol Level  Recent Labs  Lab 04/12/21 1441  ETH <10    IMAGING past 24 hours CT ANGIO HEAD NECK W WO CM  Result Date: 04/12/2021 CLINICAL DATA:   Follow-up examination for stroke. EXAM: CT ANGIOGRAPHY HEAD AND NECK TECHNIQUE: Multidetector CT imaging of the head and neck was performed using the standard protocol during bolus administration of intravenous contrast. Multiplanar CT image reconstructions and MIPs were obtained to evaluate the vascular anatomy. Carotid stenosis measurements (when applicable) are obtained utilizing NASCET criteria, using the distal internal carotid diameter as the denominator. RADIATION DOSE REDUCTION: This exam was performed according to the departmental dose-optimization program which includes automated exposure control, adjustment of the mA and/or kV according to patient size and/or use of iterative reconstruction technique. CONTRAST:  143mL OMNIPAQUE IOHEXOL 350 MG/ML SOLN COMPARISON:  Prior CT from earlier the same day. FINDINGS: CTA NECK FINDINGS Aortic arch: Visualized aortic arch normal in caliber with normal branch pattern. Mild atheromatous change about the arch itself. No stenosis about the origin the great vessels. Right carotid system: Right common and internal carotid arteries patent without stenosis or dissection. Mild for age atheromatous change about the right carotid bulb. Left carotid system: Left common and internal carotid arteries  patent without stenosis or dissection. Mild for age atheromatous change about the left carotid bulb. Vertebral arteries: Both vertebral arteries arise from the subclavian arteries. 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 Right vertebral artery patent without stenosis or dissection. Skeleton: No discrete or worrisome osseous lesions. Mild cervical spondylosis for age. Other neck: No other acute soft tissue abnormality within the neck. Upper chest: Visualized upper chest demonstrates no acute finding. Left-sided pacemaker/AICD noted. Median sternotomy wires noted as well. Review of the MIP images confirms the above findings CTA HEAD FINDINGS  Anterior circulation: Petrous segments patent bilaterally. Atheromatous change within the carotid siphons without hemodynamically significant stenosis. Right A1 patent. Left A1 hypoplastic and/or absent. Short-segment azygous ACA noted. ACAs patent distally without stenosis. No M1 stenosis or occlusion. Normal MCA bifurcations. Distal MCA branches perfused and symmetric. Distal small vessel atheromatous irregularity noted. Posterior circulation: Right V4 segment patent to the vertebrobasilar junction without stenosis. Right PICA patent. Irregular attenuated flow seen within the left V4 segment, likely retrograde in nature across the vertebrobasilar junction. Multifocal moderate to severe left V4 stenoses noted. Left PICA grossly patent at its origin, although is not seen distally. Basilar diminutive but patent to its distal aspect without stenosis. Superior cerebellar arteries patent bilaterally. Right PCA supplied via the basilar as well as a small right posterior communicating artery. Fetal type origin left PCA. Both PCAs patent to their distal aspects without stenosis. Venous sinuses: Grossly patent allowing for timing the contrast bolus. Anatomic variants: Hypoplastic/absent left A1. Azygous ACA. No aneurysm. Review of the MIP images confirms the above findings IMPRESSION: 1. Negative CTA for acute large vessel occlusion or other acute vascular abnormality. 2. Occlusion of the left vertebral artery at its origin, which remains occluded within the neck. Irregular with irregular attenuated flow within the intracranial left V4 segment, likely retrograde in nature across the vertebrobasilar junction. Multifocal moderate to severe left V4 stenoses. 3. Additional mild for age atheromatous change elsewhere about the major arterial vasculature of the head and neck. No other hemodynamically significant or correctable stenosis. Electronically Signed   By: Jeannine Boga M.D.   On: 04/12/2021 21:17   CT HEAD WO  CONTRAST  Result Date: 04/12/2021 CLINICAL DATA:  Neuro deficit, acute, stroke suspected. Loss of vision in right eye EXAM: CT HEAD WITHOUT CONTRAST TECHNIQUE: Contiguous axial images were obtained from the base of the skull through the vertex without intravenous contrast. RADIATION DOSE REDUCTION: This exam was performed according to the departmental dose-optimization program which includes automated exposure control, adjustment of the mA and/or kV according to patient size and/or use of iterative reconstruction technique. COMPARISON:  05/17/2012 FINDINGS: Brain: No evidence of acute infarction, hemorrhage, hydrocephalus, extra-axial collection or mass lesion/mass effect. 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 Scattered low-density changes within the periventricular and subcortical white matter compatible with chronic microvascular ischemic change. Mild diffuse cerebral volume loss. Vascular: Atherosclerotic calcifications involving the large vessels of the skull base. No unexpected hyperdense vessel. Skull: Normal. Negative for fracture or focal lesion. Sinuses/Orbits: There is a new extraconal lesion within the superolateral aspect of the right orbit measuring approximately 13 x 8 x 10 mm (series 3, image 14; series 5, image 21), new from prior CT. Prominence of the right superior ophthalmic vein which appears to extend directly into the lesion (series 5, images 14-21). No additional lesions identified. Normal appearance of the intraconal fat. No fluid collection. Left orbital structures are within normal limits. Paranasal sinuses and  mastoid air cells are clear. Other: None. IMPRESSION: 1. 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 Prominence of the right superior ophthalmic vein which appears to extend directly into the lesion. Appearance is nonspecific with differential including vascular malformation, venous varix, versus  solid neoplasm. Further evaluation with contrast-enhanced MRI of the orbits is recommended. 2. No acute intracranial abnormality. 3. Chronic microvascular ischemic disease and cerebral volume loss. 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 Electronically Signed   By: Davina Poke D.O.   On: 04/12/2021 16:09   VAS US CAROTID (at [facility] and [facility] only)  Result Date: 04/13/2021 Carotid Arterial Duplex Study Patient Name:  [name]  Date of Exam:   04/13/2021 Medical Rec #: [id number]         Accession #:    [id number] Date of Birth: 01/02/1935          Patient Gender: M Patient Age:   23 years Exam Location:  [facility] Procedure:      VAS US CAROTID Referring Phys: Karmen Bongo --------------------------------------------------------------------------------  Indications:      CVA. Risk Factors:     Hypertension, hyperlipidemia, coronary artery disease, prior                   CVA. Comparison Study: no prior Performing Technologist: Archie Patten RVS  Examination Guidelines: A complete evaluation includes B-mode imaging, spectral Doppler, color Doppler, and power Doppler as needed of all accessible portions of each vessel. Bilateral testing is considered an integral part of a complete examination. Limited examinations for reoccurring indications may be performed as noted.  Right Carotid Findings: +----------+--------+--------+--------+------------------+--------+             PSV cm/s EDV cm/s Stenosis Plaque Description Comments  +----------+--------+--------+--------+------------------+--------+  CCA Prox   96       13                heterogenous                 +----------+--------+--------+--------+------------------+--------+  CCA Distal 73       16                heterogenous                 +----------+--------+--------+--------+------------------+--------+  ICA Prox   60       19       1-39%    heterogenous                  +----------+--------+--------+--------+------------------+--------+  ICA Distal 69       19                                             +----------+--------+--------+--------+------------------+--------+  ECA        133      12                                             +----------+--------+--------+--------+------------------+--------+ +----------+--------+-------+--------+-------------------+             PSV cm/s EDV cms Describe Arm Pressure (mmHG)  +----------+--------+-------+--------+-------------------+  Subclavian 53                                             +----------+--------+-------+--------+-------------------+ +---------+--------+--+--------+--+---------+  Vertebral PSV cm/s 56 EDV cm/s 18 Antegrade  +---------+--------+--+--------+--+---------+  Left Carotid Findings: +----------+--------+--------+--------+------------------+--------+             PSV cm/s EDV cm/s Stenosis Plaque Description Comments  +----------+--------+--------+--------+------------------+--------+  CCA Prox   79       14                heterogenous                 +----------+--------+--------+--------+------------------+--------+  CCA Distal 86       15                heterogenous                 +----------+--------+--------+--------+------------------+--------+  ICA Prox   46       12       1-39%    heterogenous                 +----------+--------+--------+--------+------------------+--------+  ICA Distal 76       20                                             +----------+--------+--------+--------+------------------+--------+  ECA        114      24                                             +----------+--------+--------+--------+------------------+--------+ +----------+--------+--------+--------+-------------------+             PSV cm/s EDV cm/s Describe Arm Pressure (mmHG)  +----------+--------+--------+--------+-------------------+  Subclavian 90                                               +----------+--------+--------+--------+-------------------+ +---------+--------+--------+--------+  Vertebral PSV cm/s EDV cm/s occluded  +---------+--------+--------+--------+   Summary: Right Carotid: Velocities in the right ICA are consistent with a 1-39% stenosis. Left Carotid: Velocities in the left ICA are consistent with a 1-39% stenosis. Vertebrals: Right vertebral artery demonstrates antegrade flow. Left vertebral             artery demonstrates an occlusion. *See table(s) above for measurements and observations.     Preliminary    CT ORBITS W CONTRAST  Result Date: 04/12/2021 CLINICAL DATA:  Initial evaluation for benign neoplasm of eye. EXAM: CT ORBITS WITH CONTRAST TECHNIQUE: Multidetector CT images was performed according to the standard protocol following intravenous contrast administration. RADIATION DOSE REDUCTION: This exam was performed according to the departmental dose-optimization program which includes automated exposure control, adjustment of the mA and/or kV according to patient size and/or use of iterative reconstruction technique. CONTRAST:  178mL OMNIPAQUE IOHEXOL 350 MG/ML SOLN COMPARISON:  No pertinent prior exam. FINDINGS: Orbits: Globes are symmetric in size with normal appearance and morphology. Extra-ocular muscles symmetric and normal. Optic nerves within normal limits. No abnormality about the orbital apices. Intraconal and extraconal fat well-maintained. Lacrimal glands normal. Smoothly marginated soft tissue lesion at the superolateral aspect of the postseptal right orbit measures 1.7 x 0.9 x 1.0 cm (series 6, image 15). This appears to be contiguous with the adjacent superior orbital vein which is mildly enlarged and  hyperdense. Constellation of features suggesting orbital varix. No surrounding inflammatory changes. Lesion abuts the right superior rectus muscle without significant regional mass effect. No visible evidence of cavernous sinus thrombosis. Visible paranasal  sinuses: Mild scattered mucoperiosteal thickening noted within the ethmoidal air cells. Visualized paranasal sinuses are otherwise largely clear. Visualized mastoids and middle ear cavities are clear. Soft tissues: Unremarkable. Osseous: Unremarkable. Limited intracranial: Unremarkable, better evaluated on corresponding CTA. IMPRESSION: 1. 1.7 x 0.9 x 1.0 cm lesion at the superolateral aspect of the postseptal right orbit, contiguous with the adjacent superior orbital vein, and most consistent with an orbital varix. 2. Otherwise unremarkable CT of the orbits. Electronically Signed   By: Jeannine Boga M.D.   On: 04/12/2021 21:38    PHYSICAL EXAM General:  Alert, well-developed, well-nourished elderly Caucasian male in no acute distress. No scalp tenderness.  No superficial temporal artery tenderness.  NEURO:  Mental Status: AA&Ox3  Speech/Language: speech is without dysarthria or aphasia.    Cranial Nerves:  II: Right pupil slightly sluggishly reactive.  Left pupil reacts well.  Fundi not visualized.  Right eye superior altitudinal visual field defect.  Mild right eye ptosis.  No double vision.  No proptosis III, IV, VI: EOMI. Eyelids elevate symmetrically.  VII: Smile is symmetrical.  VIII: hearing intact to voice. IX, X:  Phonation is normal.  Sensation- Intact to light touch bilaterally.   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 Yesterday, he awoke at 0900 with a visual field deficit on the right.  The patient stated that it appeared as if a blue curtain had descended horizontally over his right eye.  He saw his ophthalmologist and was diagnosed with a retinal artery occlusion.  He then came to the emergency department for  stroke workup.  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 Patient has no proptosis, eye pain or redness of the right eye.  Discussed varix with neurointerventiuonal radiology- this is an incidental finding and requires no intervention at this time.  Will continue Eliquis for now and consider addition of Pletal pending discussion with patient's cardiologist.  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 Extraconal mass within superolateral aspect of right orbit, no acute abnormality CT orbits with contrast 1.7 x 0.9 x 1.0 cm lesion at superolateral aspect of right orbit, consistent with orbital varix Carotid Doppler  1-39% stenosis in left and right carotid arteries 2D Echo EF 60-65%, moderate LVH, left ventricular basal inferior hypokinesis, dilated left atrium, prosthetic aortic valve, no atrial level shunt LDL 64 HgbA1c 6.5 VTE prophylaxis - fully anticoagulated with Eliquis    Diet   Diet Heart Room service appropriate? Yes; Fluid consistency: Thin   Eliquis (apixaban) daily prior to admission, now on aspirin 81 mg daily. Will switch back to Eliquis Therapy recommendations:  pending Disposition:  pending  Hypertension Home meds:  doxazosin 4 mg daily Stable Permissive hypertension (OK if < 220/120) but gradually normalize in 5-7 days Long-term BP goal normotensive  Hyperlipidemia Home meds:  rosuvastatin 20 mg daily, zetia 10 mg daily, resumed in hospital LDL 64, goal <  17 Continue statin at discharge  Diabetes type II Controlled Home meds:  none HgbA1c 6.5, goal < 7.0 CBGs Recent Labs    04/12/21 1431 04/13/21 0748  GLUCAP 183* 134*    SSI  Atrial fibrillation Continue anticoagulation with Eliquis Continue home digoxin and diltiazem  Other Stroke Risk Factors Advanced  Age >/= 22  Hx stroke CHF CAD   Other Active Problems Hypothyroidism Continue home synthroid  Hospital day # [place] , MSN, AGACNP-BC [facility] See Amion for schedule and pager information 04/13/2021 12:54 PM   STROKE MD NOTE :  I have personally obtained history,examined this patient, reviewed notes, independently viewed imaging studies, participated in medical decision making and plan of care.ROS completed by me personally and pertinent positives fully documented  I have made any additions or clarifications directly to the above note. Agree with note above.   He presented with sudden onset of painless vision loss in the upper half of the right eye from retinal artery branch occlusion possibly of embolic etiology from his A-fib despite being on optimal anticoagulation with Eliquis.He could also possibly have nonarteritic ischemic optic neuropathy due to sluggishly reactive pupil and partial vision loss.  I discussed available treatment options for anticoagulation including continuing Eliquis versus switching to Pradaxa or Xarelto and lack of definitive evidence suggesting that this move would be beneficial.  Adding aspirin has not been shown to be beneficial and may increase the risk for bleeding.  We also discussed the recent LACE 2 trial results presented at [facility] 2023 showing benefit of using Pletal in patients with small vessel disease and since it does not increase risk for bleeding I prefer this option rather than adding aspirin and he is in agreement.  Recommend continue Eliquis 5 mg twice daily and add Pletal 100 mg twice daily.  Maintain aggressive risk factor modification.  Long discussion with patient and his personal and answered questions.  Also discussed with his cardiologist Dr. Lovena Le.  Discussed with Dr. Osborne Oman.  Greater than 50% time during this 50-minute visit were spent on counseling and coordination of care and discussion patient and care team  and answering questions.  Follow-up as an outpatient with stroke clinic in 2 months or call earlier if necessary.  Antony Contras, MD Medical Director [facility] Pager: [phone] 04/13/2021 4:05 PM   To contact Stroke Continuity provider, please refer to [email or web address] After hours, contact General Neurology

## 2021-04-13 NOTE — Progress Notes (Signed)
Carotid duplex has been completed.   Preliminary results in CV Proc.   [name] 04/13/2021 9:37 AM

## 2021-04-13 NOTE — Progress Notes (Signed)
ANTICOAGULATION CONSULT NOTE - Initial Consult  Pharmacy Consult for apixaban Indication: atrial fibrillation  Allergies  Allergen Reactions   Contrast Media [Iodinated Contrast Media] Hives   Gadolinium Derivatives Hives   Metrizamide Hives    Patient Measurements: Height: 6\' 1"  (185.4 cm) Weight: 102.7 kg (226 lb 8 oz) IBW/kg (Calculated) : 79.9  Vital Signs: Temp: 97.6 F (36.4 C) (02/13 1151) Temp Source: Oral (02/13 1151) BP: 126/65 (02/13 0945) Pulse Rate: 79 (02/13 1005)  Labs: Recent Labs    04/12/21 1429 04/12/21 1441 04/13/21 0109  HGB 11.2* 11.9* 10.3*  HCT 36.6* 35.0* 33.2*  PLT 212  --  209  APTT 30  --   --   LABPROT 14.9  --   --   INR 1.2  --   --   CREATININE 1.11 1.00 1.03    Estimated Creatinine Clearance: 67.2 mL/min (by C-G formula based on SCr of 1.03 mg/dL).   Medical History: Past Medical History:  Diagnosis Date   Aortic stenosis    moderate aortic stenosis   Arthritis    Benign prostatic hypertrophy    Chronic systolic heart failure (HCC)    Chronotropic incompetence with sinus node dysfunction (HCC)    Status post Guidant dual-mode, dual-pacing, dual-sensing  pacemaker   implantation now programmed to AAI with recent generator change.   Coronary artery disease    status post multiple prior percutaneous coronary interventions, microvascular angina per Dr Olevia Perches   Diverticulitis sigmoid colon recurrent    Dysfunctional autonomic nervous system    Dyspnea    Heart murmur    History of primary hypertension    Hypercoagulable state ([facility])    chronically anticoagulated with coumadin   Hyperlipidemia    Hyperthyroidism    Hypothyroidism    Dr. Elyse Hsu   MGUS (monoclonal gammopathy of unknown significance) 02/17/2013   Ocular myasthenia (HCC)    Osteoarthritis    Paroxysmal atrial fibrillation (HCC)    DR. 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 Okay to restart apixaban per neuro.   Quailifies for full dose apixaban - age>80 but Scr<1.5 and wt>60 kg. Hgb 10.3, plt 209. No s/sx of bleeding.   Goal of Therapy:  Monitor platelets by anticoagulation protocol: Yes   Plan:  Start apixaban 5 mg BID  Monitor CBC, renal fx, and for s/sx of bleeding   Antonietta Jewel, PharmD, [place] Pharmacist  Phone: [phone] 04/13/2021 1:30 PM  Please check AMION for all [place] phone numbers After 10:00 PM, call Rafael Hernandez [phone]

## 2021-04-13 NOTE — Evaluation (Signed)
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 CT revealed no acute intracranial abnormalities, but did reveal new mass in the R orbit. 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 PMH includes: vertigo, aortic stenosis, CHF, pacemaker placement, CAD, HTN, HLD, osteoarthritis, ocular myasthenia gravis, and stroke.   Clinical Impression  Pt in bed upon arrival of PT, agreeable to evaluation at this time. Prior to admission the pt was mobilizing without need for DME or physical assist, reports 0 falls in last 6 months, and was working with OPPT for pain and balance. The pt now presents with limitations in functional mobility, dynamic stability, and vision due to above dx, but is safe to return home with family supervision and assist as needed. The pt continues to present with baseline deficits in balance, but was able to complete sit-stand transfers without need for DME or UE support and completed ~200 ft hallway ambulation without overt LOB or UE support. He does benefit from intermittent cues for slowed speed, but was able to avoid obstacles, step over objects, and make rapid directional changes without LOB or need for assist. Encouraged pt to increase visual scanning techniques and resume OPPT for further balance assessment and balance training, but is safe to return home with family once medically cleared.   Gait Speed: 0.40m/s (Gait speed < 1.33m/s indicates increased risk of falls)       Recommendations for follow up therapy are one component of a multi-disciplinary discharge planning process, led by the attending physician.  Recommendations may be updated based on patient status, additional functional criteria and insurance authorization.  Follow Up Recommendations Outpatient PT    Assistance  Recommended at Discharge Intermittent Supervision/Assistance  Patient can return home with the following  A little help with walking and/or transfers;Assist for transportation;Help with stairs or ramp for entrance    Equipment Recommendations None recommended by PT  Recommendations for Other Services       Functional Status Assessment Patient has had a recent decline in their functional status and demonstrates the ability to make significant improvements in function in a reasonable and predictable amount of time.     Precautions / Restrictions Precautions Precautions: Fall Precaution Comments: pt with 0 falls in last 6 months Restrictions Weight Bearing Restrictions: No      Mobility  Bed Mobility Overal bed mobility: Modified Independent             General bed mobility comments: no assist, slightly increased time    Transfers Overall transfer level: Needs assistance Equipment used: None Transfers: Sit to/from Stand Sit to Stand: Min guard           General transfer comment: minG for safety, pt with no instability or need for UE support    Ambulation/Gait Ambulation/Gait assistance: Min guard Gait Distance (Feet): 200 Feet Assistive device: None Gait Pattern/deviations: Step-through pattern Gait velocity: 0.8 m/s Gait velocity interpretation: 1.31 - 2.62 ft/sec, indicative of limited community ambulator   General Gait Details: pt intermittently needing cues to slow movements for improved safety. no overt LOB, able to manage around objects in hall without any assist or running into anything     Modified Rankin (Stroke Patients Only) Modified Rankin (Stroke Patients Only) Pre-Morbid Rankin Score: Moderate disability Modified Rankin: Moderately severe disability  Balance Overall balance assessment: Needs assistance Sitting-balance support: No upper extremity supported Sitting balance-Leahy Scale: Good     Standing balance support: No upper  extremity supported, During functional activity Standing balance-Leahy Scale: Fair               High level balance activites: Direction changes, Turns, Sudden stops, Head turns, Other (comment) High Level Balance Comments: pt able to complete with minG for safety, no overt LOB. able to avoid all obstacles without any additional assist             Pertinent Vitals/Pain Pain Assessment Pain Assessment: Faces Pain Score: 4  Pain Location: neck Pain Descriptors / Indicators: Aching, Discomfort Pain Intervention(s): Patient requesting pain meds-RN notified, Monitored during session    Home Living Family/patient expects to be discharged to:: Private residence Living Arrangements: Spouse/significant other;Other (Comment) (family RN) Available Help at Discharge: Family;Personal care attendant;Available 24 hours/day Type of Home: House Home Access: Level entry       Home Layout: One level Home Equipment: Rollator (4 wheels);Shower seat - built in;Grab bars - tub/shower;Grab bars - toilet;Cane - single point      Prior Function Prior Level of Function : Independent/Modified Independent             Mobility Comments: pt receiving OPPT PTA, no falls, no use of DME ADLs Comments: pt and RN report independent with ADLs and IADLs     Hand Dominance        Extremity/Trunk Assessment   Upper Extremity Assessment Upper Extremity Assessment: Defer to OT evaluation    Lower Extremity Assessment Lower Extremity Assessment: Overall WFL for tasks assessed    Cervical / Trunk Assessment Cervical / Trunk Assessment: Normal  Communication   Communication: No difficulties  Cognition Arousal/Alertness: Awake/alert Behavior During Therapy: WFL for tasks assessed/performed Overall Cognitive Status: Within Functional Limits for tasks assessed                                 General Comments: The pt was able to follow all instructions, and cues in a timely  manner, required intermittent cues for safety and visual scanning.        General Comments General comments (skin integrity, edema, etc.): VSS on RA        Assessment/Plan    PT Assessment Patient needs continued PT services  PT Problem List Decreased activity tolerance;Decreased balance       PT Treatment Interventions DME instruction;Gait training;Stair training;Functional mobility training;Therapeutic activities;Therapeutic exercise;Balance training;Patient/family education    PT Goals (Current goals can be found in the Care Plan section)  Acute Rehab PT Goals Patient Stated Goal: return home today PT Goal Formulation: With patient Time For Goal Achievement: 04/27/21 Potential to Achieve Goals: Good    Frequency Min 4X/week        AM-PAC PT "6 Clicks" Mobility  Outcome Measure Help needed turning from your back to your side while in a flat bed without using bedrails?: None Help needed moving from lying on your back to sitting on the side of a flat bed without using bedrails?: None Help needed moving to and from a bed to a chair (including a wheelchair)?: A Little Help needed standing up from a chair using your arms (e.g., wheelchair or bedside chair)?: A Little Help needed to walk in hospital room?: A Little Help needed climbing 3-5 steps with a railing? : A Little 6 Click Score: 20  End of Session Equipment Utilized During Treatment: Gait belt Activity Tolerance: Patient tolerated treatment well Patient left: in bed;with call bell/phone within reach;with family/visitor present Nurse Communication: Mobility status;Patient requests pain meds PT Visit Diagnosis: Other abnormalities of gait and mobility (R26.89);Unsteadiness on feet (R26.81)    Time: 9923-4144 PT Time Calculation (min) (ACUTE ONLY): 25 min   Charges:   PT Evaluation $PT Eval Low Complexity: 1 Low PT Treatments $Therapeutic Exercise: 8-22 mins        West Carbo, PT, DPT   Acute  Rehabilitation Department Pager #: [phone]  Sandra Cockayne 04/13/2021, 2:25 PM

## 2021-04-13 NOTE — Progress Notes (Signed)
SLP Cancellation Note  Patient Details Name: Kevin STAYER, MD MRN: [id number] DOB: 11-07-1934   Cancelled treatment:       Reason Eval/Treat Not Completed: SLP screened, no needs identified, will sign off.  Sakira Dahmer L. Tivis Ringer, [place] CCC/SLP Acute Rehabilitation Services Office number [phone] Pager [phone]    Assunta Curtis 04/13/2021, 12:19 PM

## 2021-04-14 ENCOUNTER — Encounter: Payer: Self-pay | Admitting: Cardiovascular Disease

## 2021-04-14 NOTE — Telephone Encounter (Signed)
Thx Sarah - yes that slot is fine. We can decide on labs when we see him. Thx again

## 2021-04-15 ENCOUNTER — Encounter: Payer: Medicare Other | Admitting: Internal Medicine

## 2021-04-15 NOTE — Telephone Encounter (Signed)
Called and spoke to [place]. Pt is scheduled on 2/24 @ 1320 to see Burt Knack, MD.

## 2021-04-16 ENCOUNTER — Encounter: Payer: Medicare Other | Admitting: Physical Therapy

## 2021-04-17 ENCOUNTER — Other Ambulatory Visit (HOSPITAL_BASED_OUTPATIENT_CLINIC_OR_DEPARTMENT_OTHER): Payer: Self-pay

## 2021-04-20 ENCOUNTER — Encounter: Payer: Self-pay | Admitting: Internal Medicine

## 2021-04-20 ENCOUNTER — Other Ambulatory Visit (HOSPITAL_BASED_OUTPATIENT_CLINIC_OR_DEPARTMENT_OTHER): Payer: Self-pay

## 2021-04-20 ENCOUNTER — Non-Acute Institutional Stay: Payer: Medicare Other | Admitting: Internal Medicine

## 2021-04-20 ENCOUNTER — Other Ambulatory Visit: Payer: Self-pay

## 2021-04-20 VITALS — BP 124/98

## 2021-04-20 DIAGNOSIS — I5022 Chronic systolic (congestive) heart failure: Secondary | ICD-10-CM

## 2021-04-20 DIAGNOSIS — H34231 Retinal artery branch occlusion, right eye: Secondary | ICD-10-CM

## 2021-04-20 DIAGNOSIS — Z9889 Other specified postprocedural states: Secondary | ICD-10-CM

## 2021-04-20 DIAGNOSIS — I25118 Atherosclerotic heart disease of native coronary artery with other forms of angina pectoris: Secondary | ICD-10-CM

## 2021-04-20 DIAGNOSIS — G894 Chronic pain syndrome: Secondary | ICD-10-CM | POA: Diagnosis not present

## 2021-04-20 DIAGNOSIS — I10 Essential (primary) hypertension: Secondary | ICD-10-CM | POA: Diagnosis not present

## 2021-04-20 DIAGNOSIS — N401 Enlarged prostate with lower urinary tract symptoms: Secondary | ICD-10-CM

## 2021-04-20 DIAGNOSIS — I4892 Unspecified atrial flutter: Secondary | ICD-10-CM

## 2021-04-20 DIAGNOSIS — K219 Gastro-esophageal reflux disease without esophagitis: Secondary | ICD-10-CM | POA: Diagnosis not present

## 2021-04-20 DIAGNOSIS — M158 Other polyosteoarthritis: Secondary | ICD-10-CM

## 2021-04-20 DIAGNOSIS — R42 Dizziness and giddiness: Secondary | ICD-10-CM

## 2021-04-20 DIAGNOSIS — E785 Hyperlipidemia, unspecified: Secondary | ICD-10-CM

## 2021-04-20 NOTE — Progress Notes (Signed)
Location:   [facility] Room Number: [place] of Service:  Clinic [phone]) Provider:  Veleta Miners MD  [name], MD  Patient Care Team: [name], MD as PCP - General (Internal Medicine) Buford Dresser, MD as PCP - Cardiology (Cardiology) Evans Lance, MD (Cardiology) Hillary Bow, MD (Cardiology) Bo Merino, MD as Consulting Physician (Rheumatology) Gatha Mayer, MD as Consulting Physician (Gastroenterology) Gaye Pollack, MD as Consulting Physician (Cardiothoracic Surgery) Paralee Cancel, MD as Consulting Physician (Orthopedic Surgery) Ladell Pier, MD as Consulting Physician (Oncology) Rolm Bookbinder, MD as Consulting Physician (Dermatology) Griselda Miner, MD as Consulting Physician (Dermatology) Raynelle Bring, MD as Consulting Physician (Urology) Shon Hough, MD as Consulting Physician (Ophthalmology) Alda Berthold, DO as Consulting Physician (Neurology)  Extended Emergency Contact Information Primary Emergency Contact: Whetsel,Gail Address: [address]          [place], [place] [address] Johnnette Litter of [place] Phone: [phone] Mobile Phone: [phone] Relation: Spouse Secondary Emergency Contact: Penoyer,Kim Home Phone: [phone] Relation: Other  Code Status:  Full Code Goals of care: Advanced Directive information Advanced Directives 04/20/2021  Does Patient Have a Medical Advance Directive? Yes  Type of Advance Directive Living will  Does patient want to make changes to medical advance directive? No - Guardian declined  Copy of [place] in Chart? -  Would patient like information on creating a medical advance directive? -  Pre-existing out of facility DNR order (yellow form or pink MOST form) -     Chief Complaint  Patient presents with   Transitions Of Care    Patient was admitted and discharged from [facility]  04/12/2021 - 04/13/2021 (26 hours)    Acute Visit    Follow up from hospital.    HPI:  Pt is a 87 y.o. male seen today for an acute visit for Follow up from the hospital  Here with his Nurse  Admitted from 2/12-2/13 for Retinal Artery branch occlusion.  Has h/o CAD, s/p Stent Distal RCA Stent Cath in 09/2019 Repeat Cath Done in 08/22 for Anginal Symptoms. Stents were open  Follows very Closely with Cardiology  H/o Atrial Flutter on Eliquis Due to high runs of A Flutter Digoxin was added  Sinus Node Dysfunction s/p PPM Orthostatic Hypotension Severe OsteoArthritis on Norco Prn  and Prednisone PRN  Adrenal Insufficiency per Dr Renne Crigler due to Prolong Steroid use for Osteoarthritis  Takes Prednisone 10 mg QD MGUS Follows with Dr Benay Spice for  Labs BPH  Chronic Fatigue and Weakness Underwent R L4-5 hemilaminectomy with diskectomy on 12/26 for severe back pain radiating to right lower extremity Chronic Dizziness   Patient went to the ED when he realized that part of his vision in the right eye was blocked by a negative curtain.  Seen by the eye doctor who diagnosed a retinal artery branch occlusion.  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 Now has Nurse to help with Meds Staying independent with his ADLS Walking with no falls or assist Holds furniture if needed  Past Medical History:   Diagnosis Date   Aortic stenosis    moderate aortic stenosis   Arthritis    Benign prostatic hypertrophy    Chronic systolic heart failure (HCC)    Chronotropic incompetence with sinus node dysfunction (HCC)    Status post Guidant dual-mode, dual-pacing, dual-sensing  pacemaker   implantation now programmed to AAI with recent generator change.   Coronary artery disease    status post multiple prior percutaneous coronary interventions, microvascular angina per Dr Olevia Perches   Diverticulitis sigmoid colon recurrent    Dysfunctional autonomic nervous system    Dyspnea    Heart murmur    History of primary hypertension    Hypercoagulable state ([place])    chronically anticoagulated with coumadin   Hyperlipidemia    Hyperthyroidism    Hypothyroidism    Dr. 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 Lia Foyer,    Prediabetes 09/21/2017   Stroke ([place])    1990   Past Surgical History:  Procedure Laterality Date   AORTIC VALVE REPLACEMENT  03/15/2011   Procedure: AORTIC VALVE REPLACEMENT (AVR);  Surgeon: Gaye Pollack, MD;  Location: [facility];  Service: Open Heart Surgery;  Laterality: N/A;   APPENDECTOMY     CARDIAC CATHETERIZATION     11   CARDIOVERSION     CARDIOVERSION  04/15/2011   Procedure: CARDIOVERSION;  Surgeon: Loralie Champagne, MD;  Location: [facility];  Service: Cardiovascular;  Laterality: N/A;   CARDIOVERSION N/A 09/11/2014   Procedure: CARDIOVERSION;  Surgeon: Sueanne Margarita, MD;  Location: [facility] ENDOSCOPY;  Service: Cardiovascular;  Laterality: N/A;   CARDIOVERSION N/A 06/27/2015   Procedure: CARDIOVERSION;  Surgeon: Thayer Headings, MD;  Location: [facility];  Service: Cardiovascular;  Laterality: N/A;   CARDIOVERSION N/A 07/04/2015   Procedure: CARDIOVERSION;  Surgeon: Evans Lance, MD;  Location: [facility];  Service: Cardiovascular;  Laterality: N/A;    CARDIOVERSION N/A 04/13/2017   Procedure: CARDIOVERSION;  Surgeon: Sueanne Margarita, MD;  Location: [facility] ENDOSCOPY;  Service: Cardiovascular;  Laterality: N/A;   COLONOSCOPY     CORONARY STENT INTERVENTION N/A 10/15/2019   Procedure: CORONARY STENT INTERVENTION;  Surgeon: Jettie Booze, MD;  Location: [facility] CV LAB;  Service: Cardiovascular;  Laterality: N/A;   EP IMPLANTABLE DEVICE N/A 06/16/2015   Procedure: Pacemaker Implant;  Surgeon: Evans Lance, MD;  Location: [facility] CV LAB;  Service: Cardiovascular;  Laterality: N/A;   hemrrhoidectomy     LEFT AND RIGHT HEART CATHETERIZATION WITH CORONARY ANGIOGRAM Bilateral 02/01/2011   Procedure: LEFT AND RIGHT HEART CATHETERIZATION WITH CORONARY ANGIOGRAM;  Surgeon: Hillary Bow, MD;  [facility];  Service: Cardiovascular;  Laterality: Bilateral;   LEFT HEART CATH AND CORONARY ANGIOGRAPHY N/A 10/15/2019   Procedure: LEFT HEART CATH AND CORONARY ANGIOGRAPHY;  Surgeon: Jettie Booze, MD;  Location: [facility] CV LAB;  Service: Cardiovascular;  Laterality: N/A;   LEFT HEART CATH AND CORONARY ANGIOGRAPHY N/A 10/07/2020   Procedure: LEFT HEART CATH AND CORONARY ANGIOGRAPHY;  Surgeon: Sherren Mocha, MD;  Location: [facility] CV LAB;  Service: Cardiovascular;  Laterality: N/A;   LUMBAR LAMINECTOMY/DECOMPRESSION MICRODISCECTOMY Right 02/23/2021   Procedure: Right Lumbar four-five microdiscectomy;  Surgeon: Eustace Moore, MD;  Location: [facility];  Service: Neurosurgery;  Laterality: Right;   MAZE  03/15/2011   Procedure: MAZE;  Surgeon: Gaye Pollack, MD;  Location: [facility];  Service: Open Heart Surgery;  Laterality: N/A;   PACEMAKER INSERTION  1991   Guidant PPM, most recent Generator Change by Dr Olevia Perches was 08/22/06   RIGHT/LEFT HEART CATH AND CORONARY ANGIOGRAPHY N/A 07/06/2016   Procedure: Right/Left Heart Cath and Coronary Angiography;  Surgeon: Sherren Mocha, MD;  Location: [facility] CV LAB;  Service: Cardiovascular;   Laterality: N/A;   TEE WITHOUT CARDIOVERSION  04/15/2011   Procedure: TRANSESOPHAGEAL ECHOCARDIOGRAM (TEE);  Surgeon: Loralie Champagne, MD;  Location: [facility];  Service: Cardiovascular;  Laterality: N/A;   TEE WITHOUT CARDIOVERSION N/A 09/11/2014   Procedure: TRANSESOPHAGEAL ECHOCARDIOGRAM (TEE);  Surgeon: Sueanne Margarita, MD;  Location: [facility] ENDOSCOPY;  Service: Cardiovascular;  Laterality: N/A;    Allergies  Allergen Reactions   Contrast Media [Iodinated Contrast Media] Hives   Gadolinium Derivatives Hives   Metrizamide Hives    Allergies as of 04/20/2021       Reactions   Contrast Media [iodinated Contrast Media] Hives   Gadolinium Derivatives Hives   Metrizamide Hives        Medication List        Accurate as of April 20, 2021 11:59 PM. If you have any questions, ask your nurse or doctor.          butalbital-acetaminophen-caffeine 50-325-40 MG tablet Commonly known as: FIORICET Take 1 tablet by mouth twice daily as needed for headache.   cilostazol 100 MG tablet Commonly known as: PLETAL Take 1 tablet (100 mg total) by mouth 2 (two) times daily.   Coenzyme Q10 200 MG Tabs Take 200 mg by mouth daily.   desoximetasone 0.25 % cream Commonly known as: TOPICORT Apply 1 application topically 2 (two) times daily. What changed:  when to take this reasons to take this   diclofenac Sodium 1 % Gel Commonly known as: VOLTAREN Apply 2 g topically 2 (two) times daily as needed (knee pain).   digoxin 0.125 MG tablet Commonly known as: LANOXIN Take 1 tablet (0.125 mg total) by mouth daily.   diltiazem 30 MG tablet Commonly known as: CARDIZEM Take 30 mg by mouth daily as needed (sbp <130).   docusate sodium 100 MG capsule Commonly known as: COLACE Take 100 mg by mouth 2 (two) times daily.   doxazosin 4 MG tablet Commonly known as: CARDURA Take 1 tablet by mouth daily What changed:  how much to take how to take this when to take this   Eliquis 5 MG Tabs  tablet Generic drug: apixaban TAKE 1 TABLET BY MOUTH 2 TIMES DAILY. What changed: how much to take   ezetimibe 10 MG tablet Commonly known as: ZETIA Take 1 tablet (10 mg total) by mouth daily. What changed: when to take this   HYDROcodone-acetaminophen 5-325 MG tablet Commonly known as: Norco Take 1 tablet by mouth 3 (three) times daily. What changed:  how much to take when to take this   LORazepam 1 MG tablet Commonly known as: ATIVAN TAKE 1 TABLET BY MOUTH AT BEDTIME. MAY ALSO TAKE 1/2 TABLET DAILY AS NEEDED FOR ANXIETY. What changed:  how much to take how to take this when to take this   magnesium oxide 400 MG tablet Commonly known as: MAG-OX Take 400 mg by mouth daily.   metoprolol succinate 50 MG 24 hr tablet Commonly known as:  TOPROL-XL Take 1/2 tablet (25 mg) in the AM and 1 tablet (50 mg) in the PM. What changed:  how much to take how to take this when to take this additional instructions   mometasone 0.1 % cream Commonly known as: ELOCON Apply 1 application topically daily. What changed:  when to take this reasons to take this   multivitamin with minerals tablet Take 1 tablet by mouth daily.   nitroGLYCERIN 0.4 MG SL tablet Commonly known as: NITROSTAT PLACE 1 TABLET UNDER THE TONGUE EVERY 5 MINUTES AS NEEDED FOR CHEST PAIN. What changed:  how much to take how to take this when to take this reasons to take this   pantoprazole 40 MG tablet Commonly known as: PROTONIX TAKE 1 TABLET BY MOUTH DAILY. What changed: how much to take   polyethylene glycol 17 g packet Commonly known as: MIRALAX / GLYCOLAX Take 17 g by mouth daily.   potassium chloride SA 20 MEQ tablet Commonly known as: KLOR-CON M TAKE 1 TABLET BY MOUTH DAILY. What changed: how much to take   predniSONE 10 MG tablet Commonly known as: DELTASONE Take 1 tablet (10 mg total) by mouth daily with breakfast. What changed: additional instructions   rosuvastatin 20 MG  tablet Commonly known as: CRESTOR TAKE 1 TABLET (20 MG TOTAL) BY MOUTH AT BEDTIME. What changed: how much to take   Vitamin D3 50 MCG (2000 UT) capsule Take 2,000 Units by mouth daily.        Review of Systems  Constitutional:  Positive for activity change. Negative for appetite change and unexpected weight change.  HENT: Negative.    Respiratory:  Negative for cough and shortness of breath.   Cardiovascular:  Negative for leg swelling.  Gastrointestinal:  Negative for constipation.  Genitourinary:  Positive for frequency.  Musculoskeletal:  Positive for arthralgias, gait problem and myalgias.  Skin: Negative.  Negative for rash.  Neurological:  Positive for dizziness. Negative for weakness.  Psychiatric/Behavioral:  Negative for confusion and sleep disturbance.   All other systems reviewed and are negative.  Immunization History  Administered Date(s) Administered   Influenza Split 12/30/2017   Influenza, High Dose Seasonal PF 12/28/2019   Influenza,inj,quad, With Preservative 11/29/2017   Influenza-Unspecified 12/19/2020   PFIZER(Purple Top)SARS-COV-2 Vaccination 03/13/2019, 04/02/2019, 11/01/2019   Pertinent  Health Maintenance Due  Topic Date Due   INFLUENZA VACCINE  Completed   Fall Risk 02/23/2021 03/24/2021 04/12/2021 04/12/2021 04/13/2021  Falls in the past year? - 0 - - -  Was there an injury with Fall? - 0 - - -  Fall Risk Category Calculator - 0 - - -  Fall Risk Category - Low - - -  Patient Fall Risk Level Moderate fall risk Low fall risk Low fall risk High fall risk High fall risk  Patient at Risk for Falls Due to - No Fall Risks - - -  Fall risk Follow up - Falls evaluation completed - - -   Functional Status Survey:    Vitals:   04/20/21 1107  BP: (!) 124/98   There is no height or weight on file to calculate BMI. Physical Exam Vitals reviewed.  Constitutional:      Appearance: Normal appearance.  HENT:     Head: Normocephalic.     Nose: Nose  normal.     Mouth/Throat:     Mouth: Mucous membranes are moist.     Pharynx: Oropharynx is clear.  Eyes:     Pupils: Pupils are equal, round, and  reactive to light.  Cardiovascular:     Rate and Rhythm: Normal rate. Rhythm irregular.     Pulses: Normal pulses.     Heart sounds: No murmur heard. Pulmonary:     Effort: Pulmonary effort is normal. No respiratory distress.     Breath sounds: Normal breath sounds. No rales.  Abdominal:     General: Abdomen is flat. Bowel sounds are normal.     Palpations: Abdomen is soft.  Musculoskeletal:        General: No swelling.     Cervical back: Neck supple.  Skin:    General: Skin is warm.  Neurological:     General: No focal deficit present.     Mental Status: He is alert and oriented to person, place, and time.  Psychiatric:        Mood and Affect: Mood normal.        Thought Content: Thought content normal.    Labs reviewed: Recent Labs    02/23/21 1341 04/12/21 1429 04/12/21 1441 04/13/21 0109  NA 138 138 139 136  K 4.4 4.4 4.4 3.9  CL 101 101 103 103  CO2 29 26  --  23  GLUCOSE 166* 203* 195* 204*  BUN 18 16 16 15   CREATININE 0.93 1.11 1.00 1.03  CALCIUM 9.2 9.1  --  8.9   Recent Labs    08/26/20 1234 04/12/21 1429  AST 22 26  ALT 19 19  ALKPHOS 93 82  BILITOT 0.5 0.6  PROT 7.7 7.5  ALBUMIN 4.3 4.0   Recent Labs    08/26/20 1234 09/30/20 1657 02/23/21 1341 04/12/21 1429 04/12/21 1441 04/13/21 0109  WBC 7.0   < > 14.0* 10.5  --  8.8  NEUTROABS 5.9  --   --  9.5*  --  8.2*  HGB 11.7*   < > 12.4* 11.2* 11.9* 10.3*  HCT 36.9*   < > 40.8 36.6* 35.0* 33.2*  MCV 91.3   < > 91.5 91.0  --  89.5  PLT 203   < > 219 212  --  209   < > = values in this interval not displayed.   Lab Results  Component Value Date   TSH 2.231 10/14/2019   Lab Results  Component Value Date   HGBA1C 6.5 (H) 04/13/2021   Lab Results  Component Value Date   CHOL 131 04/13/2021   HDL 62 04/13/2021   LDLCALC 64 04/13/2021    LDLDIRECT 117.9 11/16/2010   TRIG 27 04/13/2021   CHOLHDL 2.1 04/13/2021    Significant Diagnostic Results in last 30 days:  CT ANGIO HEAD NECK W WO CM  Result Date: 04/12/2021 CLINICAL DATA:  Follow-up examination for stroke. EXAM: CT ANGIOGRAPHY HEAD AND NECK TECHNIQUE: Multidetector CT imaging of the head and neck was performed using the standard protocol during bolus administration of intravenous contrast. Multiplanar CT image reconstructions and MIPs were obtained to evaluate the vascular anatomy. Carotid stenosis measurements (when applicable) are obtained utilizing NASCET criteria, using the distal internal carotid diameter as the denominator. RADIATION DOSE REDUCTION: This exam was performed according to the departmental dose-optimization program which includes automated exposure control, adjustment of the mA and/or kV according to patient size and/or use of iterative reconstruction technique. CONTRAST:  160mL OMNIPAQUE IOHEXOL 350 MG/ML SOLN COMPARISON:  Prior CT from earlier the same day. FINDINGS: CTA NECK FINDINGS Aortic arch: Visualized aortic arch normal in caliber with normal branch pattern. Mild atheromatous change about the arch itself. No stenosis  about the origin the great vessels. Right carotid system: Right common and internal carotid arteries patent without stenosis or dissection. Mild for age atheromatous change about the right carotid bulb. Left carotid system: Left common and internal carotid arteries patent without stenosis or dissection. Mild for age atheromatous change about the left carotid bulb. Vertebral arteries: Both vertebral arteries arise from the subclavian arteries. 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 Right vertebral artery patent without stenosis or dissection. Skeleton: No discrete or worrisome osseous lesions. Mild cervical spondylosis for age. Other neck: No other acute soft tissue abnormality within the neck. Upper  chest: Visualized upper chest demonstrates no acute finding. Left-sided pacemaker/AICD noted. Median sternotomy wires noted as well. Review of the MIP images confirms the above findings CTA HEAD FINDINGS Anterior circulation: Petrous segments patent bilaterally. Atheromatous change within the carotid siphons without hemodynamically significant stenosis. Right A1 patent. Left A1 hypoplastic and/or absent. Short-segment azygous ACA noted. ACAs patent distally without stenosis. No M1 stenosis or occlusion. Normal MCA bifurcations. Distal MCA branches perfused and symmetric. Distal small vessel atheromatous irregularity noted. Posterior circulation: Right V4 segment patent to the vertebrobasilar junction without stenosis. Right PICA patent. Irregular attenuated flow seen within the left V4 segment, likely retrograde in nature across the vertebrobasilar junction. Multifocal moderate to severe left V4 stenoses noted. Left PICA grossly patent at its origin, although is not seen distally. Basilar diminutive but patent to its distal aspect without stenosis. Superior cerebellar arteries patent bilaterally. Right PCA supplied via the basilar as well as a small right posterior communicating artery. Fetal type origin left PCA. Both PCAs patent to their distal aspects without stenosis. Venous sinuses: Grossly patent allowing for timing the contrast bolus. Anatomic variants: Hypoplastic/absent left A1. Azygous ACA. No aneurysm. Review of the MIP images confirms the above findings IMPRESSION: 1. Negative CTA for acute large vessel occlusion or other acute vascular abnormality. 2. Occlusion of the left vertebral artery at its origin, which remains occluded within the neck. Irregular with irregular attenuated flow within the intracranial left V4 segment, likely retrograde in nature across the vertebrobasilar junction. Multifocal moderate to severe left V4 stenoses. 3. Additional mild for age atheromatous change elsewhere about the  major arterial vasculature of the head and neck. No other hemodynamically significant or correctable stenosis. Electronically Signed   By: Jeannine Boga M.D.   On: 04/12/2021 21:17   CT HEAD WO CONTRAST  Result Date: 04/12/2021 CLINICAL DATA:  Neuro deficit, acute, stroke suspected. Loss of vision in right eye EXAM: CT HEAD WITHOUT CONTRAST TECHNIQUE: Contiguous axial images were obtained from the base of the skull through the vertex without intravenous contrast. RADIATION DOSE REDUCTION: This exam was performed according to the departmental dose-optimization program which includes automated exposure control, adjustment of the mA and/or kV according to patient size and/or use of iterative reconstruction technique. COMPARISON:  05/17/2012 FINDINGS: Brain: No evidence of acute infarction, hemorrhage, hydrocephalus, extra-axial collection or mass lesion/mass effect. 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 Scattered low-density changes within the periventricular and subcortical white matter compatible with chronic microvascular ischemic change. Mild diffuse cerebral volume loss. Vascular: Atherosclerotic calcifications involving the large vessels of the skull base. No unexpected hyperdense vessel. Skull: Normal. Negative for fracture or focal lesion. Sinuses/Orbits: There is a new extraconal lesion within the superolateral aspect of the right orbit measuring approximately 13 x 8 x 10 mm (series 3, image 14; series 5, image 21), new from prior CT. Prominence of the  right superior ophthalmic vein which appears to extend directly into the lesion (series 5, images 14-21). No additional lesions identified. Normal appearance of the intraconal fat. No fluid collection. Left orbital structures are within normal limits. Paranasal sinuses and mastoid air cells are clear. Other: None. IMPRESSION: 1. New extraconal mass lesion within the superolateral aspect of the right orbit measuring 13 x 8 x 10  mm. Prominence of the right superior ophthalmic vein which appears to extend directly into the lesion. Appearance is nonspecific with differential including vascular malformation, venous varix, versus solid neoplasm. Further evaluation with contrast-enhanced MRI of the orbits is recommended. 2. No acute intracranial abnormality. 3. Chronic microvascular ischemic disease and cerebral volume loss. 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 Electronically Signed   By: Davina Poke D.O.   On: 04/12/2021 16:09   ECHOCARDIOGRAM COMPLETE  Result Date: 04/13/2021    ECHOCARDIOGRAM REPORT   Patient Name:   CLAYDEN WITHEM Date of Exam: 04/13/2021 Medical Rec #:  [id number]        Height:       73.0 in Accession #:    [id number]       Weight:       226.5 lb Date of Birth:  05-30-1934         BSA:          2.268 m Patient Age:    89 years         BP:           154/73 mmHg Patient Gender: M                HR:           66 bpm. Exam Location:  Inpatient Procedure: 2D Echo Indications:    Stroke  History:        Patient has prior history of Echocardiogram examinations, most                 recent 01/18/2020. CAD, Arrythmias:Atrial Fibrillation; Risk                 Factors:Hypertension.  Sonographer:    Jefferey Pica Referring Phys: [place]  1. Left ventricular ejection fraction, by estimation, is 60 to 65%. The left ventricle has normal function. The left ventricle demonstrates regional wall motion abnormalities. Basal inferior hypokinesis. There is moderate left ventricular hypertrophy. Left ventricular diastolic parameters are indeterminate.  2. Right ventricular systolic function is mildly reduced. The right ventricular size is mildly enlarged. There is mildly elevated pulmonary artery systolic pressure. The estimated right ventricular systolic pressure is 02.4 mmHg.  3. Left atrial size was severely dilated.  4. The mitral valve is degenerative. Trivial mitral valve  regurgitation. No evidence of mitral stenosis. Moderate mitral annular calcification.  5. There is a 25 mm Edwards Magna-Ease valve present in the aortic position.     Aortic valve regurgitation is not visualized. Aortic valve mean gradient measures 14.0 mmHg. Vmax 2.5 m/s, EOA 2.7 cm^2, DI 0.7. Echo findings are consistent with normal structure and function of the aortic valve prosthesis.  6. The inferior vena cava is dilated in size with >50% respiratory variability, suggesting right atrial pressure of 8 mmHg. FINDINGS  Left Ventricle: Left ventricular ejection fraction, by estimation, is 60 to 65%. The left ventricle has normal function. The left ventricle demonstrates regional wall motion abnormalities. The left ventricular internal cavity size was normal in size. There is moderate left ventricular hypertrophy. Left ventricular diastolic parameters  are indeterminate. Right Ventricle: The right ventricular size is mildly enlarged. No increase in right ventricular wall thickness. Right ventricular systolic function is mildly reduced. There is mildly elevated pulmonary artery systolic pressure. The tricuspid regurgitant  velocity is 2.90 m/s, and with an assumed right atrial pressure of 8 mmHg, the estimated right ventricular systolic pressure is 16.1 mmHg. Left Atrium: Left atrial size was severely dilated. Right Atrium: Right atrial size was normal in size. Pericardium: There is no evidence of pericardial effusion. Mitral Valve: The mitral valve is degenerative in appearance. Moderate mitral annular calcification. Trivial mitral valve regurgitation. No evidence of mitral valve stenosis. Tricuspid Valve: The tricuspid valve is normal in structure. Tricuspid valve regurgitation is trivial. Aortic Valve: The aortic valve has been repaired/replaced. Aortic valve regurgitation is not visualized. Aortic valve mean gradient measures 14.0 mmHg. Aortic valve peak gradient measures 24.0 mmHg. There is a 25 mm Edwards  Magna-Ease valve present in the aortic position. Echo findings are consistent with normal structure and function of the aortic valve prosthesis. Pulmonic Valve: The pulmonic valve was not well visualized. Pulmonic valve regurgitation is trivial. Aorta: The aortic root and ascending aorta are structurally normal, with no evidence of dilitation. Venous: The inferior vena cava is dilated in size with greater than 50% respiratory variability, suggesting right atrial pressure of 8 mmHg. IAS/Shunts: No atrial level shunt detected by color flow Doppler.  LEFT VENTRICLE PLAX 2D LVIDd:         5.10 cm LVIDs:         3.15 cm LV PW:         1.55 cm LV IVS:        1.45 cm LVOT diam:     2.20 cm LV SV:         132 LV SV Index:   58 LVOT Area:     3.80 cm  RIGHT VENTRICLE            IVC RV S prime:     7.43 cm/s  IVC diam: 2.60 cm TAPSE (M-mode): 1.6 cm LEFT ATRIUM              Index        RIGHT ATRIUM           Index LA diam:        5.40 cm  2.38 cm/m   RA Area:     19.40 cm LA Vol (A2C):   58.3 ml  25.70 ml/m  RA Volume:   47.20 ml  20.81 ml/m LA Vol (A4C):   133.0 ml 58.63 ml/m LA Biplane Vol: 93.2 ml  41.08 ml/m  AORTIC VALVE                  PULMONIC VALVE AV Area (Vmax): 2.67 cm      PV Vmax:       0.91 m/s AV Vmax:        245.00 cm/s   PV Peak grad:  3.3 mmHg AV Peak Grad:   24.0 mmHg AV Mean Grad:   14.0 mmHg LVOT Vmax:      172.00 cm/s LVOT Vmean:     112.000 cm/s LVOT VTI:       0.347 m  AORTA Ao Root diam: 3.80 cm Ao Asc diam:  3.70 cm MITRAL VALVE               TRICUSPID VALVE MV Area (PHT): 2.50 cm    TR Peak grad:   33.6 mmHg  MV Decel Time: 303 msec    TR Vmax:        290.00 cm/s MV E velocity: 89.70 cm/s                            SHUNTS                            Systemic VTI:  0.35 m                            Systemic Diam: 2.20 cm Oswaldo Milian MD Electronically signed by Oswaldo Milian MD Signature Date/Time: 04/13/2021/12:34:56 PM    Final    VAS US CAROTID (at [facility] and [facility]  only)  Result Date: 04/16/2021 Carotid Arterial Duplex Study Patient Name:  BEKIM WERNTZ  Date of Exam:   04/13/2021 Medical Rec #: [id number]         Accession #:    [id number] Date of Birth: [birth year]          Patient Gender: M Patient Age:   67 years Exam Location:  [facility] Procedure:      VAS US CAROTID Referring Phys: Karmen Bongo --------------------------------------------------------------------------------  Indications:      CVA. Risk Factors:     Hypertension, hyperlipidemia, coronary artery disease, prior                   CVA. Comparison Study: no prior Performing Technologist: Archie Patten RVS  Examination Guidelines: A complete evaluation includes B-mode imaging, spectral Doppler, color Doppler, and power Doppler as needed of all accessible portions of each vessel. Bilateral testing is considered an integral part of a complete examination. Limited examinations for reoccurring indications may be performed as noted.  Right Carotid Findings: +----------+--------+--------+--------+------------------+--------+             PSV cm/s EDV cm/s Stenosis Plaque Description Comments  +----------+--------+--------+--------+------------------+--------+  CCA Prox   96       13                heterogenous                 +----------+--------+--------+--------+------------------+--------+  CCA Distal 73       16                heterogenous                 +----------+--------+--------+--------+------------------+--------+  ICA Prox   60       19       1-39%    heterogenous                 +----------+--------+--------+--------+------------------+--------+  ICA Distal 69       19                                             +----------+--------+--------+--------+------------------+--------+  ECA        133      12                                             +----------+--------+--------+--------+------------------+--------+ +----------+--------+-------+--------+-------------------+  PSV  cm/s EDV cms Describe Arm Pressure (mmHG)  +----------+--------+-------+--------+-------------------+  Subclavian 53                                             +----------+--------+-------+--------+-------------------+ +---------+--------+--+--------+--+---------+  Vertebral PSV cm/s 56 EDV cm/s 18 Antegrade  +---------+--------+--+--------+--+---------+  Left Carotid Findings: +----------+--------+--------+--------+------------------+--------+             PSV cm/s EDV cm/s Stenosis Plaque Description Comments  +----------+--------+--------+--------+------------------+--------+  CCA Prox   79       14                heterogenous                 +----------+--------+--------+--------+------------------+--------+  CCA Distal 86       15                heterogenous                 +----------+--------+--------+--------+------------------+--------+  ICA Prox   46       12       1-39%    heterogenous                 +----------+--------+--------+--------+------------------+--------+  ICA Distal 76       20                                             +----------+--------+--------+--------+------------------+--------+  ECA        114      24                                             +----------+--------+--------+--------+------------------+--------+ +----------+--------+--------+--------+-------------------+             PSV cm/s EDV cm/s Describe Arm Pressure (mmHG)  +----------+--------+--------+--------+-------------------+  Subclavian 90                                              +----------+--------+--------+--------+-------------------+ +---------+--------+--------+--------+  Vertebral PSV cm/s EDV cm/s occluded  +---------+--------+--------+--------+   Summary: Right Carotid: Velocities in the right ICA are consistent with a 1-39% stenosis. Left Carotid: Velocities in the left ICA are consistent with a 1-39% stenosis. Vertebrals: Right vertebral artery demonstrates antegrade flow. Left vertebral              artery demonstrates an occlusion. *See table(s) above for measurements and observations.  Electronically signed by Antony Contras MD on 04/16/2021 at 11:55:46 AM.    Final    CT ORBITS W CONTRAST  Result Date: 04/12/2021 CLINICAL DATA:  Initial evaluation for benign neoplasm of eye. EXAM: CT ORBITS WITH CONTRAST TECHNIQUE: Multidetector CT images was performed according to the standard protocol following intravenous contrast administration. RADIATION DOSE REDUCTION: This exam was performed according to the departmental dose-optimization program which includes automated exposure control, adjustment of the mA and/or kV according to patient size and/or use of iterative reconstruction technique. CONTRAST:  155mL OMNIPAQUE IOHEXOL 350 MG/ML SOLN COMPARISON:  No pertinent prior exam. FINDINGS: Orbits: Globes are symmetric  in size with normal appearance and morphology. Extra-ocular muscles symmetric and normal. Optic nerves within normal limits. No abnormality about the orbital apices. Intraconal and extraconal fat well-maintained. Lacrimal glands normal. Smoothly marginated soft tissue lesion at the superolateral aspect of the postseptal right orbit measures 1.7 x 0.9 x 1.0 cm (series 6, image 15). This appears to be contiguous with the adjacent superior orbital vein which is mildly enlarged and hyperdense. Constellation of features suggesting orbital varix. No surrounding inflammatory changes. Lesion abuts the right superior rectus muscle without significant regional mass effect. No visible evidence of cavernous sinus thrombosis. Visible paranasal sinuses: Mild scattered mucoperiosteal thickening noted within the ethmoidal air cells. Visualized paranasal sinuses are otherwise largely clear. Visualized mastoids and middle ear cavities are clear. Soft tissues: Unremarkable. Osseous: Unremarkable. Limited intracranial: Unremarkable, better evaluated on corresponding CTA. IMPRESSION: 1. 1.7 x 0.9 x 1.0 cm lesion at the  superolateral aspect of the postseptal right orbit, contiguous with the adjacent superior orbital vein, and most consistent with an orbital varix. 2. Otherwise unremarkable CT of the orbits. Electronically Signed   By: Jeannine Boga M.D.   On: 04/12/2021 21:38    Assessment/Plan . Retinal artery branch occlusion of right eye - Plan: Ambulatory referral to Physical Therapy Follow up with Opthalmology and Neurology On Pletal and Eliquis  Dizziness - Plan: Ambulatory referral to Physical Therapy  osteoarthritis involving multiple joints with Chronic Pain  Taking Norco one and half twice a day and added one tablet at night to help with pain Managed by his Nurse Prescribed only by me now S/P laminectomy Back pain resoled Working with therapy now  Elevated CBGS and A1C Will check CBG 3/week and bring in next visit Anemia Need follow up labs next visit  Coronary artery disease  Continues to have issue with Angina Nitro makes BP drop Not able to do much  S/p Multiple Caths Closely with Cardiologists On Statin  Atrial flutter, unspecified type ([place]) On Digoxin  Also on Eliquis Essential hypertension Taking Toprol 50 mg BID as BP running high at home Hyperlipidemia,  On statin and Zetia LDL good levels Chronic HFrEF (heart failure with reduced ejection fraction) (HCC) Euvolemic EF 45-50% No ARB due to Low BP Gastroesophageal reflux disease without esophagitis On Protonix Benign prostatic hyperplasia with lower urinary tract symptoms,  Cardura Says doesn't help him that much   Family/ staff Communication:   Labs/tests ordered:

## 2021-04-21 ENCOUNTER — Ambulatory Visit (INDEPENDENT_AMBULATORY_CARE_PROVIDER_SITE_OTHER): Payer: Medicare Other | Admitting: Physical Therapy

## 2021-04-21 ENCOUNTER — Encounter: Payer: Self-pay | Admitting: Physical Therapy

## 2021-04-21 ENCOUNTER — Other Ambulatory Visit: Payer: Self-pay

## 2021-04-21 DIAGNOSIS — M6281 Muscle weakness (generalized): Secondary | ICD-10-CM | POA: Diagnosis not present

## 2021-04-21 DIAGNOSIS — R2689 Other abnormalities of gait and mobility: Secondary | ICD-10-CM

## 2021-04-21 DIAGNOSIS — M545 Low back pain, unspecified: Secondary | ICD-10-CM

## 2021-04-21 NOTE — Therapy (Signed)
OUTPATIENT PHYSICAL THERAPY TREATMENT    Patient Name: Kevin TURNAGE, MD MRN: [id number] DOB:[birth year], 87 y.o., male Today's Date: 04/21/2021  .   PT End of Session - 04/21/21 1607     Visit Number 8    Number of Visits 12    Date for PT Re-Evaluation 04/29/21    Authorization Type Medicare    PT Start Time 1016    PT Stop Time 1100    PT Time Calculation (min) 44 min    Activity Tolerance Patient tolerated treatment well    Behavior During Therapy WFL for tasks assessed/performed                  Past Medical History:  Diagnosis Date   Aortic stenosis    moderate aortic stenosis   Arthritis    Benign prostatic hypertrophy    Chronic systolic heart failure (HCC)    Chronotropic incompetence with sinus node dysfunction (HCC)    Status post Guidant dual-mode, dual-pacing, dual-sensing  pacemaker   implantation now programmed to AAI with recent generator change.   Coronary artery disease    status post multiple prior percutaneous coronary interventions, microvascular angina per Dr Olevia Perches   Diverticulitis sigmoid colon recurrent    Dysfunctional autonomic nervous system    Dyspnea    Heart murmur    History of primary hypertension    Hypercoagulable state ([place])    chronically anticoagulated with coumadin   Hyperlipidemia    Hyperthyroidism    Hypothyroidism    Dr. 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 Lia Foyer,    Prediabetes 09/21/2017   Stroke ([place])    1990   Past Surgical History:  Procedure Laterality Date   AORTIC VALVE REPLACEMENT  03/15/2011   Procedure: AORTIC VALVE REPLACEMENT (AVR);  Surgeon: Gaye Pollack, MD;  Location: [place];  Service: Open Heart Surgery;  Laterality: N/A;   APPENDECTOMY     CARDIAC CATHETERIZATION     11   CARDIOVERSION     CARDIOVERSION  04/15/2011   Procedure: CARDIOVERSION;  Surgeon:  Loralie Champagne, MD;  Location: [place];  Service: Cardiovascular;  Laterality: N/A;   CARDIOVERSION N/A 09/11/2014   Procedure: CARDIOVERSION;  Surgeon: Sueanne Margarita, MD;  Location: [place] ENDOSCOPY;  Service: Cardiovascular;  Laterality: N/A;   CARDIOVERSION N/A 06/27/2015   Procedure: CARDIOVERSION;  Surgeon: Thayer Headings, MD;  Location: [place];  Service: Cardiovascular;  Laterality: N/A;   CARDIOVERSION N/A 07/04/2015   Procedure: CARDIOVERSION;  Surgeon: Evans Lance, MD;  Location: [place];  Service: Cardiovascular;  Laterality: N/A;   CARDIOVERSION N/A 04/13/2017   Procedure: CARDIOVERSION;  Surgeon: Sueanne Margarita, MD;  Location: [facility] ENDOSCOPY;  Service: Cardiovascular;  Laterality: N/A;   COLONOSCOPY     CORONARY STENT INTERVENTION N/A 10/15/2019   Procedure: CORONARY STENT INTERVENTION;  Surgeon: Jettie Booze, MD;  Location: [place] CV LAB;  Service: Cardiovascular;  Laterality: N/A;   EP IMPLANTABLE DEVICE N/A 06/16/2015   Procedure: Pacemaker Implant;  Surgeon: Evans Lance, MD;  Location: [place] CV LAB;  Service: Cardiovascular;  Laterality: N/A;   hemrrhoidectomy     LEFT AND RIGHT HEART CATHETERIZATION WITH CORONARY ANGIOGRAM Bilateral 02/01/2011   Procedure: LEFT AND RIGHT HEART CATHETERIZATION WITH CORONARY ANGIOGRAM;  Surgeon: Hillary Bow, MD;  Location[facility] CATH LAB;  Service: Cardiovascular;  Laterality: Bilateral;   LEFT HEART CATH AND CORONARY ANGIOGRAPHY N/A 10/15/2019   Procedure: LEFT HEART CATH AND CORONARY ANGIOGRAPHY;  Surgeon: Jettie Booze, MD;  Location: [place] CV LAB;  Service: Cardiovascular;  Laterality: N/A;   LEFT HEART CATH AND CORONARY ANGIOGRAPHY N/A 10/07/2020   Procedure: LEFT HEART CATH AND CORONARY ANGIOGRAPHY;  Surgeon: Sherren Mocha, MD;  Location: [place] CV LAB;  Service: Cardiovascular;  Laterality: N/A;   LUMBAR LAMINECTOMY/DECOMPRESSION MICRODISCECTOMY Right 02/23/2021   Procedure: Right Lumbar  four-five microdiscectomy;  Surgeon: Eustace Moore, MD;  Location: [place];  Service: Neurosurgery;  Laterality: Right;   MAZE  03/15/2011   Procedure: MAZE;  Surgeon: Gaye Pollack, MD;  Location: [place];  Service: Open Heart Surgery;  Laterality: N/A;   PACEMAKER INSERTION  1991   Guidant PPM, most recent Generator Change by Dr Olevia Perches was 08/22/06   RIGHT/LEFT HEART CATH AND CORONARY ANGIOGRAPHY N/A 07/06/2016   Procedure: Right/Left Heart Cath and Coronary Angiography;  Surgeon: Sherren Mocha, MD;  Location: [place] CV LAB;  Service: Cardiovascular;  Laterality: N/A;   TEE WITHOUT CARDIOVERSION  04/15/2011   Procedure: TRANSESOPHAGEAL ECHOCARDIOGRAM (TEE);  Surgeon: Loralie Champagne, MD;  Location: [place];  Service: Cardiovascular;  Laterality: N/A;   TEE WITHOUT CARDIOVERSION N/A 09/11/2014   Procedure: TRANSESOPHAGEAL ECHOCARDIOGRAM (TEE);  Surgeon: Sueanne Margarita, MD;  Location: [facility] ENDOSCOPY;  Service: Cardiovascular;  Laterality: N/A;   Patient Active Problem List   Diagnosis Date Noted   Central retinal artery occlusion 04/13/2021   Retinal artery branch occlusion of right eye 04/12/2021   Essential hypertension 04/12/2021   Mass of orbit 04/12/2021   Pure hypercholesterolemia 09/08/2020   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[place]) 84/69/6295   Chronic systolic heart failure ([place]) 02/27/2020   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   Urinary retention with incomplete bladder emptying 12/05/2019   Physical deconditioning 12/05/2019   Other hemorrhoids 12/05/2019   Adjustment reaction with anxiety 12/05/2019   A-fib ([place]) 10/14/2019   Atrial fibrillation with rapid ventricular response ([place]) 10/13/2019   NSTEMI (non-ST elevated myocardial infarction) ([place]) 10/13/2019   Acute diarrhea 10/13/2019   Normocytic anemia 10/13/2019   Diverticulitis sigmoid colon recurrent    Prediabetes 09/21/2017   Persistent atrial fibrillation ([place]) 04/11/2017   Vitamin D  deficiency 05/20/2016   History of pacemaker 05/20/2016   History of CVA (cerebrovascular accident) 05/20/2016   Primary osteoarthritis of both hands 03/10/2016   DDD cervical spine 03/10/2016   Osteoarthritis of lumbar spine 03/10/2016   Chronic left shoulder pain 03/10/2016   Trochanteric bursitis of right hip 03/10/2016   Other fatigue 03/10/2016   Claudication in peripheral vascular disease ([place]) 03/10/2016   Chronic pain syndrome 03/10/2016   Ocular myasthenia gravis ([place]) 10/28/2015   Typical atrial flutter (HCC)    PVC's (premature ventricular contractions) 01/30/2015   Pacemaker 04/30/2013   IgM monoclonal gammopathy of uncertain significance 02/17/2013   Orthostatic hypotension 04/11/2011   Long term current use of anticoagulant therapy 03/24/2011   S/P AVR (aortic valve replacement) 03/24/2011   Pleural effusion 03/24/2011   Hypothyroidism 11/22/2010   Atrial flutter ([place]) 08/19/2010   Syndrome X (cardiac) ([place]) 04/06/2010   Mixed hyperlipidemia 12/08/2007   PRIMARY HYPERCOAGULABLE STATE 12/08/2007   Coronary artery disease with exertional angina ([place]) 12/08/2007   Coronary artery disease of native artery of native heart with stable angina pectoris ([place]) 12/08/2007   AORTIC STENOSIS/ INSUFFICIENCY, NON-RHEUMATIC 12/08/2007   Atrial fibrillation ([place]) 12/08/2007  Chronotropic incompetence with sinus node dysfunction (HCC) 12/08/2007    PCP: [name], MD  REFERRING PROVIDER: Sherley Bounds  REFERRING DIAG: S/P lumbar microdiscectomy   THERAPY DIAG:  Muscle weakness (generalized)  Other abnormalities of gait and mobility  Acute bilateral low back pain without sciatica  ONSET DATE: 02/23/21  SUBJECTIVE:                                                                                                                                                                                           SUBJECTIVE STATEMENT: Since he was seen last, pt with new R occular  occlusion- admitted to hospital. Has some mild vision restrictions now in R eye. States he is still felling dizzy at times, intermittently. Still feels weak. Seeing cardiologist this week for question on dizziness. Back is doing pretty well, increased with more standing.   PERTINENT HISTORY:  Afib, Chronic Heart Failure , Orthostatic Hypotension, OA (shoulders, hands, hips), Pacemaker,   PAIN:  Are you having pain? Yes NPRS scale: 5/10 Pain location: Low back  Pain orientation: Right  PAIN TYPE: aching Pain description: constant  Aggravating factors: standing, walking Relieving factors: sitting, resting.   PRECAUTIONS: Back and ICD/Pacemaker  WEIGHT BEARING RESTRICTIONS No  FALLS:  Has patient fallen in last 6 months? No, Number of falls: 0  LIVING ENVIRONMENT: Lives with: lives with their spouse Lives in: House/apartment Stairs: No;  Has following equipment at home: Single point cane   PLOF: Independent  PATIENT GOALS  general mobility, strengthening, increased ability for walking, endurance, feels limited by A-Fib.    OBJECTIVE:   COGNITION:  Overall cognitive status: Within functional limits for tasks assessed    PALPATION: Fully healed incision at lumbar spine  LUMBARAROM/PROM  A/PROM A/PROM  03/18/2021  Flexion Mod deficit   Extension Mod deficit  Right lateral flexion Mild/mod deficit   Left lateral flexion Mild/mod deficit   Right rotation   Left rotation    (Blank rows = not tested)  LE AROM/PROM:  Hips: mild deficits for flex and rotation. Knees: WFL   LE MMT:  Hips: 4/5;  Knee: 4+/5     Todays Treatment:  04/21/21: Aerobic: Supine:   Seated: sit to stand x 10 , S/L:   Standing:  Marching x20;  Heel raises x 20;  Mini Squats x 20;  Stretches:    Neuromuscular Re-education: Standing L/R and A/P weight shifts x 25 ea; fwd and bwd stepping with weight shifts x 20 bil; toe taps on 6 in step x 20; step ups 6 in x 10 bil, 1 UE Support; stairs  up/down 5 steps x  3, 1 UE support.  Quick walking 35 ft x 6;  Manual Therapy:   Therapeutic Activity:  04/06/21: Aerobic: Supine:  Bridging x 10 Seated: sit to stand x 10 , S/L:  hip abd 3 x 5; Clams x 10;  Standing:  Marching x20;  Heel raises x 20;  Mini Squats x 20;  Stretches:   SKTC 30 sec x 3 bil;  Pelvic tilts x 15;  Neuromuscular Re-education: Manual Therapy:  Long leg distraction on R for lumbar traction x 2 min;  TPR to R glute med with tennis ball x 7  min  Therapeutic Activity:    04/01/21:  Aerobic: Supine:  Seated: sit to stand x 10       Standing:  Marching x20; hip abd 2x10 bil; Heel raises x 20;  Toe taps on 6 in step x 25, 1 UE support;  up/down 5 steps, 6 in, 1 UE support, x 5;  Stretches:   SKTC with towel 30 sec x 2 bil;  Pelvic tilts x 15;  Neuromuscular Re-education: Manual Therapy:  Long leg distraction on R for lumbar traction x 2 min;  TPR to R glute med with tennis ball x 6  min  Therapeutic Activity:    PATIENT EDUCATION:  Education details: reviewed HEP Person educated: Patient Education method: Explanation and Verbal cues Education comprehension: verbalized understanding, returned demonstration, verbal cues required, and needs further education   HOME EXERCISE PROGRAM:  Access Code: [email or web address]    ASSESSMENT:  CLINICAL IMPRESSION: 04/21/21: Pt with recent occular occlusion, actually has some improved vision from previously, less double vision. He does have some vision loss in superior frame of R eye, still able to see well looking down to floor- for balance. No new balance deficits or other ataxia seen today with activities. Pt to benefit from continued work on general strength, mobility, balance. Back seems to be doing well post operatively, he does still have some soreness at times with increased walking. Discussed continuing to do activity as tolerated, he is still getting tired and sob often with standing activity, and requires seated rest breaks.  Pt to benefit from continued care.     Objective impairments include decreased activity tolerance, decreased balance, decreased coordination, decreased endurance, decreased mobility, difficulty walking, decreased ROM, decreased strength, decreased safety awareness, dizziness, increased muscle spasms, impaired flexibility, improper body mechanics, and pain. These impairments are limiting patient from cleaning, community activity, driving, and shopping. Personal factors including : cardiac, are also affecting patient's functional outcome. Patient will benefit from skilled PT to address above impairments and improve overall function.  REHAB POTENTIAL: Fair   CLINICAL DECISION MAKING: Stable/uncomplicated  EVALUATION COMPLEXITY: Low   GOALS: Goals reviewed with patient? Yes  SHORT TERM GOALS:  STG Name Target Date Goal status  1 Patient will be independent with initial HEP   04/01/21 INITIAL  2 Pt to demo independence with log roll transfer to protect back.  04/01/21 INITIAL         LONG TERM GOALS:   LTG Name Target Date Goal status  1 Patient will be independent with final HEP  04/29/21 INITIAL  2 Pt to report decreased pain in back and LEs to 0-2/10 with walking and activity.   04/29/21 INITIAL  3 Pt to demonstrate improved strength of bil LEs to be at least 4+/5, to improve ability for gait and stairs   04/29/21 INITIAL  4 Pt to demo improved score on TUG (TBD)   04/29/21 INITIAL  5  Pt to demo improved score on 6 min walk test (TBD)   04/29/21 INITIAL     PLAN: PT FREQUENCY: 1-2x/week  PT DURATION: 6 weeks  PLANNED INTERVENTIONS: Therapeutic exercises, Therapeutic activity, Neuro Muscular re-education, Balance training, Gait training, Patient/Family education, Joint mobilization, Stair training, Vestibular training, Visual/preceptual remediation/compensation, DME instructions, Dry Needling, Spinal mobilization, Cryotherapy, Moist heat, Taping, Traction, and Manual therapy  PLAN FOR  NEXT SESSION:   Lyndee Hensen, PT, DPT 4:08 PM  04/21/21

## 2021-04-24 ENCOUNTER — Other Ambulatory Visit: Payer: Self-pay

## 2021-04-24 ENCOUNTER — Ambulatory Visit (INDEPENDENT_AMBULATORY_CARE_PROVIDER_SITE_OTHER): Payer: Medicare Other | Admitting: Cardiovascular Disease

## 2021-04-24 ENCOUNTER — Other Ambulatory Visit (HOSPITAL_BASED_OUTPATIENT_CLINIC_OR_DEPARTMENT_OTHER): Payer: Self-pay

## 2021-04-24 ENCOUNTER — Encounter: Payer: Self-pay | Admitting: Cardiovascular Disease

## 2021-04-24 VITALS — BP 142/90 | HR 68 | Ht 73.0 in | Wt 226.2 lb

## 2021-04-24 DIAGNOSIS — E782 Mixed hyperlipidemia: Secondary | ICD-10-CM | POA: Diagnosis not present

## 2021-04-24 DIAGNOSIS — Z952 Presence of prosthetic heart valve: Secondary | ICD-10-CM | POA: Diagnosis not present

## 2021-04-24 DIAGNOSIS — I25118 Atherosclerotic heart disease of native coronary artery with other forms of angina pectoris: Secondary | ICD-10-CM | POA: Diagnosis not present

## 2021-04-24 DIAGNOSIS — I4819 Other persistent atrial fibrillation: Secondary | ICD-10-CM | POA: Diagnosis not present

## 2021-04-24 DIAGNOSIS — I5032 Chronic diastolic (congestive) heart failure: Secondary | ICD-10-CM

## 2021-04-24 DIAGNOSIS — I25119 Atherosclerotic heart disease of native coronary artery with unspecified angina pectoris: Secondary | ICD-10-CM | POA: Diagnosis not present

## 2021-04-24 MED ORDER — CILOSTAZOL 100 MG PO TABS
100.0000 mg | ORAL_TABLET | Freq: Two times a day (BID) | ORAL | 3 refills | Status: DC
  Filled 2021-04-24 – 2021-05-07 (×4): qty 180, 90d supply, fill #0
  Filled 2021-07-30: qty 180, 90d supply, fill #1
  Filled 2021-10-28: qty 180, 90d supply, fill #2
  Filled 2022-01-18: qty 180, 90d supply, fill #3

## 2021-04-24 MED ORDER — AMLODIPINE BESYLATE 2.5 MG PO TABS
2.5000 mg | ORAL_TABLET | Freq: Every day | ORAL | 3 refills | Status: DC
  Filled 2021-04-24: qty 90, 90d supply, fill #0
  Filled 2021-06-30 – 2021-07-16 (×2): qty 90, 90d supply, fill #1
  Filled 2021-10-14: qty 90, 90d supply, fill #2
  Filled 2022-01-12: qty 90, 90d supply, fill #3

## 2021-04-24 NOTE — Patient Instructions (Signed)
Medication Instructions:  START Amlodipine (Norvasc) 2.5mg  once daily at bedtime START Cilostazol (Pletal) 100mg  twice daily *If you need a refill on your cardiac medications before your next appointment, please call your pharmacy*   Lab Work: NONE If you have labs (blood work) drawn today and your tests are completely normal, you will receive your results only by: [place] (if you have MyChart) OR A paper copy in the mail If you have any lab test that is abnormal or we need to change your treatment, we will call you to review the results.   Testing/Procedures: NONE   Follow-Up: At [facility], you and your health needs are our priority.  As part of our continuing mission to provide you with exceptional heart care, we have created designated Provider Care Teams.  These Care Teams include your primary Cardiologist (physician) and Advanced Practice Providers (APPs -  Physician Assistants and Nurse Practitioners) who all work together to provide you with the care you need, when you need it.  Your next appointment:   6 month(s)  The format for your next appointment:   In Person  Provider:   Sherren Mocha

## 2021-04-24 NOTE — Progress Notes (Signed)
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   The patient is here with his personal nurse, Maudie Mercury, today.  He presented February 12 with acute vision loss in the upper half of his right eye.  The patient was diagnosed with a retinal branch occlusion.  He had been compliant with apixaban therapy for prevention of thromboembolism related to atrial fibrillation.  Dr. Leonie Man added cilostazol 100 mg twice daily to his medical regimen.  Since discharge, his blood pressure has been consistently elevated, but not as high as it was in the hospital.  In the hospital, he had blood pressures in the range of 200/100 mmHg.  With permissive hypertension, this was allowed, with recommendation to resume normal blood pressure goals within a few weeks.  His blood pressure since discharge has been in the 140-150s over 80s and 90s.  He has had episodic chest pain with no change in pattern from his baseline chest discomfort which is central when location and feels like a pressure.  He has not required sublingual nitroglycerin.  Symptoms have been mild.  His shortness of breath is unchanged.  Generalized fatigue is unchanged.  No orthopnea or PND.  Past Medical History:  Diagnosis Date   Aortic stenosis    moderate aortic stenosis   Arthritis    Benign prostatic hypertrophy    Chronic systolic  heart failure (HCC)    Chronotropic incompetence with sinus node dysfunction (HCC)    Status post Guidant dual-mode, dual-pacing, dual-sensing  pacemaker   implantation now programmed to AAI with recent generator change.   Coronary artery disease    status post multiple prior percutaneous coronary interventions, microvascular angina per Dr Olevia Perches   Diverticulitis sigmoid colon recurrent    Dysfunctional autonomic nervous system    Dyspnea    Heart murmur    History of primary hypertension    Hypercoagulable state ([place])    chronically anticoagulated with coumadin   Hyperlipidemia    Hyperthyroidism    Hypothyroidism    Dr. 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 Lia Foyer,    Prediabetes 09/21/2017   Stroke ([place])    1990    Past Surgical History:  Procedure Laterality Date   AORTIC VALVE REPLACEMENT  03/15/2011   Procedure: AORTIC VALVE REPLACEMENT (AVR);  Surgeon: Gaye Pollack, MD;  Location: [place];  Service: Open Heart Surgery;  Laterality: N/A;   APPENDECTOMY     CARDIAC CATHETERIZATION     11   CARDIOVERSION     CARDIOVERSION  04/15/2011   Procedure: CARDIOVERSION;  Surgeon: Loralie Champagne, MD;  Location: [place];  Service: Cardiovascular;  Laterality: N/A;   CARDIOVERSION N/A 09/11/2014   Procedure: CARDIOVERSION;  Surgeon: Sueanne Margarita, MD;  Location: [facility]  ENDOSCOPY;  Service: Cardiovascular;  Laterality: N/A;   CARDIOVERSION N/A 06/27/2015   Procedure: CARDIOVERSION;  Surgeon: Thayer Headings, MD;  Location: [place];  Service: Cardiovascular;  Laterality: N/A;   CARDIOVERSION N/A 07/04/2015   Procedure: CARDIOVERSION;  Surgeon: Evans Lance, MD;  Location: [place];  Service: Cardiovascular;  Laterality: N/A;   CARDIOVERSION N/A 04/13/2017   Procedure: CARDIOVERSION;  Surgeon: Sueanne Margarita, MD;  Location: [facility] ENDOSCOPY;  Service:  Cardiovascular;  Laterality: N/A;   COLONOSCOPY     CORONARY STENT INTERVENTION N/A 10/15/2019   Procedure: CORONARY STENT INTERVENTION;  Surgeon: Jettie Booze, MD;  Location: [place] CV LAB;  Service: Cardiovascular;  Laterality: N/A;   EP IMPLANTABLE DEVICE N/A 06/16/2015   Procedure: Pacemaker Implant;  Surgeon: Evans Lance, MD;  Location: [place] CV LAB;  Service: Cardiovascular;  Laterality: N/A;   hemrrhoidectomy     LEFT AND RIGHT HEART CATHETERIZATION WITH CORONARY ANGIOGRAM Bilateral 02/01/2011   Procedure: LEFT AND RIGHT HEART CATHETERIZATION WITH CORONARY ANGIOGRAM;  Surgeon: Hillary Bow, MD;  [facility] C[facility] LAB;  Service: Cardiovascular;  Laterality: Bilateral;   LEFT HEART CATH AND CORONARY ANGIOGRAPHY N/A 10/15/2019   Procedure: LEFT HEART CATH AND CORONARY ANGIOGRAPHY;  Surgeon: Jettie Booze, MD;  Location: [place] CV LAB;  Service: Cardiovascular;  Laterality: N/A;   LEFT HEART CATH AND CORONARY ANGIOGRAPHY N/A 10/07/2020   Procedure: LEFT HEART CATH AND CORONARY ANGIOGRAPHY;  Surgeon: Sherren Mocha, MD;  Location: [place] CV LAB;  Service: Cardiovascular;  Laterality: N/A;   LUMBAR LAMINECTOMY/DECOMPRESSION MICRODISCECTOMY Right 02/23/2021   Procedure: Right Lumbar four-five microdiscectomy;  Surgeon: Eustace Moore, MD;  Location: [place];  Service: Neurosurgery;  Laterality: Right;   MAZE  03/15/2011   Procedure: MAZE;  Surgeon: Gaye Pollack, MD;  Location: [place];  Service: Open Heart Surgery;  Laterality: N/A;   PACEMAKER INSERTION  1991   Guidant PPM, most recent Generator Change by Dr Olevia Perches was 08/22/06   RIGHT/LEFT HEART CATH AND CORONARY ANGIOGRAPHY N/A 07/06/2016   Procedure: Right/Left Heart Cath and Coronary Angiography;  Surgeon: Sherren Mocha, MD;  Location: [place] CV LAB;  Service: Cardiovascular;  Laterality: N/A;   TEE WITHOUT CARDIOVERSION  04/15/2011   Procedure: TRANSESOPHAGEAL ECHOCARDIOGRAM (TEE);  Surgeon: Loralie Champagne, MD;  Location: [place];  Service: Cardiovascular;  Laterality: N/A;   TEE WITHOUT CARDIOVERSION N/A 09/11/2014   Procedure: TRANSESOPHAGEAL ECHOCARDIOGRAM (TEE);  Surgeon: Sueanne Margarita, MD;  Location: [facility] ENDOSCOPY;  Service: Cardiovascular;  Laterality: N/A;    Current Medications: Current Meds  Medication Sig   amLODipine (NORVASC) 2.5 MG tablet Take 1 tablet (2.5 mg total) by mouth at bedtime.   apixaban (ELIQUIS) 5 MG TABS tablet TAKE 1 TABLET BY MOUTH 2 TIMES DAILY. (Patient taking differently: Take 5 mg by mouth 2 (two) times daily.)   butalbital-acetaminophen-caffeine (FIORICET) 50-325-40 MG tablet Take 1 tablet by mouth twice daily as needed for headache.   Cholecalciferol (VITAMIN D3) 2000 units capsule Take 2,000 Units by mouth daily.   cilostazol (PLETAL) 100 MG tablet Take 1 tablet (100 mg total) by mouth 2 (two) times daily.   Coenzyme Q10 200 MG TABS Take 200 mg by mouth daily.   desoximetasone (TOPICORT) 0.25 % cream Apply 1 application topically 2 (two) times daily. (Patient taking differently: Apply 1 application topically 2 (two) times daily as needed (rash or irritation).)   diclofenac Sodium (VOLTAREN) 1 % GEL Apply 2 g topically 2 (  two) times daily as needed (knee pain).   digoxin (LANOXIN) 0.125 MG tablet Take 1 tablet (0.125 mg total) by mouth daily.   diltiazem (CARDIZEM) 30 MG tablet Take 30 mg by mouth daily as needed (sbp <130).   docusate sodium (COLACE) 100 MG capsule Take 100 mg by mouth 2 (two) times daily.   doxazosin (CARDURA) 4 MG tablet Take 1 tablet by mouth daily (Patient taking differently: Take 4 mg by mouth at bedtime.)   ezetimibe (ZETIA) 10 MG tablet Take 1 tablet (10 mg total) by mouth daily. (Patient taking differently: Take 10 mg by mouth at bedtime.)   HYDROcodone-acetaminophen (NORCO) 5-325 MG tablet Take 1 tablet by mouth 3 (three) times daily. (Patient taking differently: Take 2 tablets by mouth in the morning and at bedtime.)    LORazepam (ATIVAN) 1 MG tablet TAKE 1 TABLET BY MOUTH AT BEDTIME. MAY ALSO TAKE 1/2 TABLET DAILY AS NEEDED FOR ANXIETY. (Patient taking differently: Take 1 mg by mouth at bedtime.)   magnesium oxide (MAG-OX) 400 MG tablet Take 400 mg by mouth daily.   metoprolol succinate (TOPROL-XL) 50 MG 24 hr tablet Take 1/2 tablet (25 mg) in the AM and 1 tablet (50 mg) in the PM. (Patient taking differently: Take 50 mg by mouth 2 (two) times daily.)   mometasone (ELOCON) 0.1 % cream Apply 1 application topically daily. (Patient taking differently: Apply 1 application topically daily as needed (rash or irritation).)   Multiple Vitamins-Minerals (MULTIVITAMIN WITH MINERALS) tablet Take 1 tablet by mouth daily.   nitroGLYCERIN (NITROSTAT) 0.4 MG SL tablet PLACE 1 TABLET UNDER THE TONGUE EVERY 5 MINUTES AS NEEDED FOR CHEST PAIN. (Patient taking differently: Place 0.4 mg under the tongue every 5 (five) minutes as needed for chest pain.)   pantoprazole (PROTONIX) 40 MG tablet TAKE 1 TABLET BY MOUTH DAILY. (Patient taking differently: Take 40 mg by mouth daily.)   polyethylene glycol (MIRALAX / GLYCOLAX) 17 g packet Take 17 g by mouth daily.   potassium chloride SA (KLOR-CON M) 20 MEQ tablet TAKE 1 TABLET BY MOUTH DAILY. (Patient taking differently: Take 20 mEq by mouth daily.)   predniSONE (DELTASONE) 10 MG tablet Take 1 tablet (10 mg total) by mouth daily with breakfast. (Patient taking differently: Take 10 mg by mouth daily with breakfast. Continuous)   rosuvastatin (CRESTOR) 20 MG tablet TAKE 1 TABLET (20 MG TOTAL) BY MOUTH AT BEDTIME. (Patient taking differently: Take 20 mg by mouth at bedtime.)   [DISCONTINUED] cilostazol (PLETAL) 100 MG tablet Take 1 tablet (100 mg total) by mouth 2 (two) times daily.     Allergies:   Contrast media [iodinated contrast media], Gadolinium derivatives, and Metrizamide   Social History   Socioeconomic History   Marital status: Married    Spouse name: Baker Janus    Number of children:  3   Years of education: Not on file   Highest education level: Not on file  Occupational History   Occupation: Retired    Comment: Physician  Tobacco Use   Smoking status: Never   Smokeless tobacco: Never  Vaping Use   Vaping Use: Never used  Substance and Sexual Activity   Alcohol use: No    Alcohol/week: 0.0 standard drinks   Drug use: No   Sexual activity: Not Currently  Other Topics Concern   Not on file  Social History Narrative   Married to [place]. Has grown children   Retired Horticulturist, commercial MD      Never smoker no alcohol  Social Determinants of Health   Financial Resource Strain: Not on file  Food Insecurity: Not on file  Transportation Needs: Not on file  Physical Activity: Not on file  Stress: Not on file  Social Connections: Not on file     Family History: The patient's family history includes Anorexia nervosa in his daughter; Atrial fibrillation in his brother; CAD in his brother; Depression in his daughter; Heart disease in his brother; Hypertension in his son; Sarcoidosis in his brother. There is no history of Anesthesia problems, Hypotension, Malignant hyperthermia, or Pseudochol deficiency.  ROS:   Please see the history of present illness.    All other systems reviewed and are negative.  EKGs/Labs/Other Studies Reviewed:    The following studies were reviewed today: Echo:   1. Left ventricular ejection fraction, by estimation, is 60 to 65%. The  left ventricle has normal function. The left ventricle demonstrates  regional wall motion abnormalities. Basal inferior hypokinesis. There is  moderate left ventricular hypertrophy.  Left ventricular diastolic parameters are indeterminate.   2. Right ventricular systolic function is mildly reduced. The right  ventricular size is mildly enlarged. There is mildly elevated pulmonary  artery systolic pressure. The estimated right ventricular systolic  pressure is 10.1 mmHg.   3. Left atrial size was severely  dilated.   4. The mitral valve is degenerative. Trivial mitral valve regurgitation.  No evidence of mitral stenosis. Moderate mitral annular calcification.   5. There is a 25 mm Edwards Magna-Ease valve present in the aortic  position.      Aortic valve regurgitation is not visualized. Aortic valve mean  gradient measures 14.0 mmHg. Vmax 2.5 m/s, EOA 2.7 cm^2, DI 0.7. Echo  findings are consistent with normal structure and function of the aortic  valve prosthesis.   6. The inferior vena cava is dilated in size with >50% respiratory  variability, suggesting right atrial pressure of 8 mmHg.   EKG:  EKG is not ordered today.    Recent Labs: 04/12/2021: ALT 19 04/13/2021: BUN 15; Creatinine, Ser 1.03; Hemoglobin 10.3; Platelets 209; Potassium 3.9; Sodium 136  Recent Lipid Panel    Component Value Date/Time   CHOL 131 04/13/2021 0109   CHOL 158 09/02/2017 1006   TRIG 27 04/13/2021 0109   HDL 62 04/13/2021 0109   HDL 72 09/02/2017 1006   CHOLHDL 2.1 04/13/2021 0109   VLDL 5 04/13/2021 0109   LDLCALC 64 04/13/2021 0109   LDLCALC 68 09/02/2017 1006   LDLDIRECT 117.9 11/16/2010 1355     Risk Assessment/Calculations:    CHA2DS2-VASc Score = 7   This indicates a 11.2% annual risk of stroke. The patient's score is based upon: CHF History: 1 HTN History: 1 Diabetes History: 0 Stroke History: 2 Vascular Disease History: 1 Age Score: 2 Gender Score: 0          Physical Exam:    VS:  BP (!) 142/90    Pulse 68    Ht 6\' 1"  (1.854 m)    Wt 226 lb 3.2 oz (102.6 kg)    SpO2 96%    BMI 29.84 kg/m     Wt Readings from Last 3 Encounters:  04/24/21 226 lb 3.2 oz (102.6 kg)  04/12/21 226 lb 8 oz (102.7 kg)  03/25/21 226 lb (102.5 kg)     GEN:  Well nourished, well developed in no acute distress HEENT: Normal NECK: No JVD; No carotid bruits LYMPHATICS: No lymphadenopathy CARDIAC: irregularly irregular, 2/6 SEM at the RUSB RESPIRATORY:  Clear to auscultation without rales,  wheezing or rhonchi  ABDOMEN: Soft, non-tender, non-distended MUSCULOSKELETAL:  No edema; No deformity  SKIN: Warm and dry NEUROLOGIC:  Alert and oriented x 3 PSYCHIATRIC:  Normal affect   ASSESSMENT:    1. Persistent atrial fibrillation ([place])   2. S/P AVR (aortic valve replacement)   3. Coronary artery disease of native artery of native heart with stable angina pectoris ([place])   4. Mixed hyperlipidemia   5. Chronic diastolic heart failure (HCC)    PLAN:    In order of problems listed above:  Patient with recent right retinal artery infarct occurring on apixaban therapy.  Patient evaluated by stroke neurology and Pletal was added to his medical regimen.  Upcoming ophthalmology follow-up noted.  Neurology follow-up also planned in the outpatient setting with Dr. Leonie Man. Normal valve function on recent echocardiogram Stable angina noted, recommended no changes today. Treated with rosuvastatin 20 mg daily.  LDL cholesterol is at goal of less than 70 mg/dL. Appears stable.  I personally reviewed his echo images with him today.  His ejection fraction has improved from the 45 to 50% range down to 60 to 65% range. While his blood pressure is above goal, I have recommended adding amlodipine 2.5 mg daily.  Starting with low-dose because of history of autonomic dysfunction and orthostatic hypotension.     Medication Adjustments/Labs and Tests Ordered: Current medicines are reviewed at length with the patient today.  Concerns regarding medicines are outlined above.  No orders of the defined types were placed in this encounter.  Meds ordered this encounter  Medications   amLODipine (NORVASC) 2.5 MG tablet    Sig: Take 1 tablet (2.5 mg total) by mouth at bedtime.    Dispense:  90 tablet    Refill:  3   cilostazol (PLETAL) 100 MG tablet    Sig: Take 1 tablet (100 mg total) by mouth 2 (two) times daily.    Dispense:  180 tablet    Refill:  3    Patient Instructions  Medication Instructions:   START Amlodipine (Norvasc) 2.5mg  once daily at bedtime START Cilostazol (Pletal) 100mg  twice daily *If you need a refill on your cardiac medications before your next appointment, please call your pharmacy*   Lab Work: NONE If you have labs (blood work) drawn today and your tests are completely normal, you will receive your results only by: [place] (if you have MyChart) OR A paper copy in the mail If you have any lab test that is abnormal or we need to change your treatment, we will call you to review the results.   Testing/Procedures: NONE   Follow-Up: At [facility], you and your health needs are our priority.  As part of our continuing mission to provide you with exceptional heart care, we have created designated Provider Care Teams.  These Care Teams include your primary Cardiologist (physician) and Advanced Practice Providers (APPs -  Physician Assistants and Nurse Practitioners) who all work together to provide you with the care you need, when you need it.  Your next appointment:   6 month(s)  The format for your next appointment:   In Person  Provider:   Sherren Mocha        Signed, Sherren Mocha, MD  04/24/2021 5:17 PM    [place]

## 2021-04-27 ENCOUNTER — Ambulatory Visit (INDEPENDENT_AMBULATORY_CARE_PROVIDER_SITE_OTHER): Payer: Medicare Other | Admitting: Physical Therapy

## 2021-04-27 ENCOUNTER — Other Ambulatory Visit (HOSPITAL_BASED_OUTPATIENT_CLINIC_OR_DEPARTMENT_OTHER): Payer: Self-pay

## 2021-04-27 ENCOUNTER — Other Ambulatory Visit: Payer: Self-pay

## 2021-04-27 ENCOUNTER — Encounter: Payer: Self-pay | Admitting: Physical Therapy

## 2021-04-27 DIAGNOSIS — R2689 Other abnormalities of gait and mobility: Secondary | ICD-10-CM

## 2021-04-27 DIAGNOSIS — M545 Low back pain, unspecified: Secondary | ICD-10-CM

## 2021-04-27 DIAGNOSIS — M6281 Muscle weakness (generalized): Secondary | ICD-10-CM

## 2021-04-27 NOTE — Therapy (Signed)
OUTPATIENT PHYSICAL THERAPY TREATMENT    Patient Name: Kevin EDERER, MD MRN: [id number] DOB:11-07-1934, 87 y.o., male Today's Date: 04/27/2021  .   PT End of Session - 04/27/21 1301     Visit Number 9    Number of Visits 12    Date for PT Re-Evaluation 04/29/21    Authorization Type Medicare    PT Start Time 1216    PT Stop Time 1300    PT Time Calculation (min) 44 min    Activity Tolerance Patient tolerated treatment well    Behavior During Therapy WFL for tasks assessed/performed                   Past Medical History:  Diagnosis Date   Aortic stenosis    moderate aortic stenosis   Arthritis    Benign prostatic hypertrophy    Chronic systolic heart failure (HCC)    Chronotropic incompetence with sinus node dysfunction (HCC)    Status post Guidant dual-mode, dual-pacing, dual-sensing  pacemaker   implantation now programmed to AAI with recent generator change.   Coronary artery disease    status post multiple prior percutaneous coronary interventions, microvascular angina per Dr Olevia Perches   Diverticulitis sigmoid colon recurrent    Dysfunctional autonomic nervous system    Dyspnea    Heart murmur    History of primary hypertension    Hypercoagulable state ([place])    chronically anticoagulated with coumadin   Hyperlipidemia    Hyperthyroidism    Hypothyroidism    Dr. 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 Lia Foyer,    Prediabetes 09/21/2017   Stroke ([place])    1990   Past Surgical History:  Procedure Laterality Date   AORTIC VALVE REPLACEMENT  03/15/2011   Procedure: AORTIC VALVE REPLACEMENT (AVR);  Surgeon: Gaye Pollack, MD;  Location: [place];  Service: Open Heart Surgery;  Laterality: N/A;   APPENDECTOMY     CARDIAC CATHETERIZATION     11   CARDIOVERSION     CARDIOVERSION  04/15/2011   Procedure: CARDIOVERSION;  Surgeon:  Loralie Champagne, MD;  Location: [place];  Service: Cardiovascular;  Laterality: N/A;   CARDIOVERSION N/A 09/11/2014   Procedure: CARDIOVERSION;  Surgeon: Sueanne Margarita, MD;  Location: [facility];  Service: Cardiovascular;  Laterality: N/A;   CARDIOVERSION N/A 06/27/2015   Procedure: CARDIOVERSION;  Surgeon: Thayer Headings, MD;  Location: [place];  Service: Cardiovascular;  Laterality: N/A;   CARDIOVERSION N/A 07/04/2015   Procedure: CARDIOVERSION;  Surgeon: Evans Lance, MD;  Location: [place];  Service: Cardiovascular;  Laterality: N/A;   CARDIOVERSION N/A 04/13/2017   Procedure: CARDIOVERSION;  Surgeon: Sueanne Margarita, MD;  Location: [facility];  Service: Cardiovascular;  Laterality: N/A;   COLONOSCOPY     CORONARY STENT INTERVENTION N/A 10/15/2019   Procedure: CORONARY STENT INTERVENTION;  Surgeon: Jettie Booze, MD;  Location: [place] [facility];  Service: Cardiovascular;  Laterality: N/A;   EP IMPLANTABLE DEVICE N/A 06/16/2015   Procedure: Pacemaker Implant;  Surgeon: Evans Lance, MD;  Location: [place] [facility];  Service: Cardiovascular;  Laterality: N/A;   hemrrhoidectomy     LEFT AND RIGHT HEART CATHETERIZATION WITH CORONARY ANGIOGRAM Bilateral 02/01/2011   Procedure: LEFT AND RIGHT HEART CATHETERIZATION WITH CORONARY ANGIOGRAM;  Surgeon: Hillary Bow, MD;  Location: [facility];  Service: Cardiovascular;  Laterality: Bilateral;   LEFT HEART CATH AND CORONARY ANGIOGRAPHY N/A 10/15/2019   Procedure: LEFT HEART CATH AND CORONARY ANGIOGRAPHY;  Surgeon: Jettie Booze, MD;  Location: [place] [facility];  Service: Cardiovascular;  Laterality: N/A;   LEFT HEART CATH AND CORONARY ANGIOGRAPHY N/A 10/07/2020   Procedure: LEFT HEART CATH AND CORONARY ANGIOGRAPHY;  Surgeon: Sherren Mocha, MD;  Location: [place] [facility];  Service: Cardiovascular;  Laterality: N/A;   LUMBAR LAMINECTOMY/DECOMPRESSION MICRODISCECTOMY Right 02/23/2021   Procedure: Right Lumbar  four-five microdiscectomy;  Surgeon: Eustace Moore, MD;  Location: [place];  Service: Neurosurgery;  Laterality: Right;   MAZE  03/15/2011   Procedure: MAZE;  Surgeon: Gaye Pollack, MD;  Location: [place];  Service: Open Heart Surgery;  Laterality: N/A;   PACEMAKER INSERTION  1991   Guidant PPM, most recent Generator Change by Dr Olevia Perches was 08/22/06   RIGHT/LEFT HEART CATH AND CORONARY ANGIOGRAPHY N/A 07/06/2016   Procedure: Right/Left Heart Cath and Coronary Angiography;  Surgeon: Sherren Mocha, MD;  Location: [place] [facility];  Service: Cardiovascular;  Laterality: N/A;   TEE WITHOUT CARDIOVERSION  04/15/2011   Procedure: TRANSESOPHAGEAL ECHOCARDIOGRAM (TEE);  Surgeon: Loralie Champagne, MD;  Location: [place];  Service: Cardiovascular;  Laterality: N/A;   TEE WITHOUT CARDIOVERSION N/A 09/11/2014   Procedure: TRANSESOPHAGEAL ECHOCARDIOGRAM (TEE);  Surgeon: Sueanne Margarita, MD;  Location: [facility];  Service: Cardiovascular;  Laterality: N/A;   Patient Active Problem List   Diagnosis Date Noted   Central retinal artery occlusion 04/13/2021   Retinal artery branch occlusion of right eye 04/12/2021   Essential hypertension 04/12/2021   Mass of orbit 04/12/2021   Pure hypercholesterolemia 09/08/2020   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[place]) 29/79/8921   Chronic systolic heart failure ([place]) 02/27/2020   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   Urinary retention with incomplete bladder emptying 12/05/2019   Physical deconditioning 12/05/2019   Other hemorrhoids 12/05/2019   Adjustment reaction with anxiety 12/05/2019   A-fib ([place]) 10/14/2019   Atrial fibrillation with rapid ventricular response ([place]) 10/13/2019   NSTEMI (non-ST elevated myocardial infarction) ([place]) 10/13/2019   Acute diarrhea 10/13/2019   Normocytic anemia 10/13/2019   Diverticulitis sigmoid colon recurrent    Prediabetes 09/21/2017   Persistent atrial fibrillation ([place]) 04/11/2017   Vitamin D  deficiency 05/20/2016   History of pacemaker 05/20/2016   History of CVA (cerebrovascular accident) 05/20/2016   Primary osteoarthritis of both hands 03/10/2016   DDD cervical spine 03/10/2016   Osteoarthritis of lumbar spine 03/10/2016   Chronic left shoulder pain 03/10/2016   Trochanteric bursitis of right hip 03/10/2016   Other fatigue 03/10/2016   Claudication in peripheral vascular disease ([place]) 03/10/2016   Chronic pain syndrome 03/10/2016   Ocular myasthenia gravis ([place]) 10/28/2015   Typical atrial flutter (HCC)    PVC's (premature ventricular contractions) 01/30/2015   Pacemaker 04/30/2013   IgM monoclonal gammopathy of uncertain significance 02/17/2013   Orthostatic hypotension 04/11/2011   Long term current use of anticoagulant therapy 03/24/2011   S/P AVR (aortic valve replacement) 03/24/2011   Pleural effusion 03/24/2011   Hypothyroidism 11/22/2010   Atrial flutter ([place]) 08/19/2010   Syndrome X (cardiac) ([place]) 04/06/2010   Mixed hyperlipidemia 12/08/2007   PRIMARY HYPERCOAGULABLE STATE 12/08/2007   Coronary artery disease with exertional angina ([place]) 12/08/2007   Coronary artery disease of native artery of native heart with stable angina pectoris ([place]) 12/08/2007   AORTIC STENOSIS/ INSUFFICIENCY, NON-RHEUMATIC 12/08/2007   Atrial fibrillation ([place]) 12/08/2007  Chronotropic incompetence with sinus node dysfunction (HCC) 12/08/2007    PCP: [name], MD  REFERRING PROVIDER: Sherley Bounds  REFERRING DIAG: S/P lumbar microdiscectomy   THERAPY DIAG:  Other abnormalities of gait and mobility  Muscle weakness (generalized)  Acute bilateral low back pain without sciatica  ONSET DATE: 02/23/21  SUBJECTIVE:                                                                                                                                                                                           SUBJECTIVE STATEMENT: Pt with no new complaints.   PERTINENT HISTORY:   Afib, Chronic Heart Failure , Orthostatic Hypotension, OA (shoulders, hands, hips), Pacemaker,   PAIN:  Are you having pain? Yes NPRS scale: 5/10 Pain location: Low back  Pain orientation: Right  PAIN TYPE: aching Pain description: constant  Aggravating factors: standing, walking Relieving factors: sitting, resting.   PRECAUTIONS: Back and ICD/Pacemaker  WEIGHT BEARING RESTRICTIONS No  FALLS:  Has patient fallen in last 6 months? No, Number of falls: 0  LIVING ENVIRONMENT: Lives with: lives with their spouse Lives in: House/apartment Stairs: No;  Has following equipment at home: Single point cane   PLOF: Independent  PATIENT GOALS  general mobility, strengthening, increased ability for walking, endurance, feels limited by A-Fib.    OBJECTIVE:   COGNITION:  Overall cognitive status: Within functional limits for tasks assessed    PALPATION: Fully healed incision at lumbar spine  LUMBARAROM/PROM  A/PROM A/PROM  03/18/2021  Flexion Mod deficit   Extension Mod deficit  Right lateral flexion Mild/mod deficit   Left lateral flexion Mild/mod deficit   Right rotation   Left rotation    (Blank rows = not tested)  LE AROM/PROM:  Hips: mild deficits for flex and rotation. Knees: WFL   LE MMT:  Hips: 4/5;  Knee: 4+/5     Todays Treatment:   04/27/21: Aerobic: L2 1 min on/ 1 min off-rest, x 5 rounds.  Supine:   Seated: sit to stand x 10 , S/L:   Standing:  Marching x20;  Heel raises x 20;  Mini Squats x 20;  Stretches:  HSS 30 sec x 3;  Neuromuscular Re-education: fwd and bwd stepping with weight shifts x 20 bil;  toe taps on 6 in step x 20;  Manual Therapy:   Therapeutic Activity:    04/21/21: Aerobic: Supine:   Seated: sit to stand x 10 , S/L:   Standing:  Marching x20;  Heel raises x 20;  Mini Squats x 20;  Stretches:    Neuromuscular Re-education: Standing L/R and A/P weight shifts x 25 ea;  fwd and bwd stepping with weight shifts x 20 bil; toe  taps on 6 in step x 20; step ups 6 in x 10 bil, 1 UE Support; stairs up/down 5 steps x 3, 1 UE support.  Quick walking 35 ft x 6;  Manual Therapy:   Therapeutic Activity:  04/06/21: Aerobic: Supine:  Bridging x 10 Seated: sit to stand x 10 , S/L:  hip abd 3 x 5; Clams x 10;  Standing:  Marching x20;  Heel raises x 20;  Mini Squats x 20;  Stretches:   SKTC 30 sec x 3 bil;  Pelvic tilts x 15;  Neuromuscular Re-education: Manual Therapy:  Long leg distraction on R for lumbar traction x 2 min;  TPR to R glute med with tennis ball x 7  min  Therapeutic Activity:   PATIENT EDUCATION:  Education details: reviewed HEP Person educated: Patient Education method: Explanation and Verbal cues Education comprehension: verbalized understanding, returned demonstration, verbal cues required, and needs further education   HOME EXERCISE PROGRAM:  Access Code: [email or web address]    ASSESSMENT:  CLINICAL IMPRESSION: 04/27/21:  Pt able to do bike today with good tolerance. Education on energy conservation, going slow, and allowing 1 min rest in between. Pt requires cuing to slow down stepping movements with balance exercises. Reports continued soreness in back at times with standing. Reviewed HEP. Pt to benefit from continued care.    Objective impairments include decreased activity tolerance, decreased balance, decreased coordination, decreased endurance, decreased mobility, difficulty walking, decreased ROM, decreased strength, decreased safety awareness, dizziness, increased muscle spasms, impaired flexibility, improper body mechanics, and pain. These impairments are limiting patient from cleaning, community activity, driving, and shopping. Personal factors including : cardiac, are also affecting patient's functional outcome. Patient will benefit from skilled PT to address above impairments and improve overall function.  REHAB POTENTIAL: Fair   CLINICAL DECISION MAKING: Stable/uncomplicated  EVALUATION  COMPLEXITY: Low   GOALS: Goals reviewed with patient? Yes  SHORT TERM GOALS:  STG Name Target Date Goal status  1 Patient will be independent with initial HEP   04/01/21 INITIAL  2 Pt to demo independence with log roll transfer to protect back.  04/01/21 INITIAL         LONG TERM GOALS:   LTG Name Target Date Goal status  1 Patient will be independent with final HEP  04/29/21 INITIAL  2 Pt to report decreased pain in back and LEs to 0-2/10 with walking and activity.   04/29/21 INITIAL  3 Pt to demonstrate improved strength of bil LEs to be at least 4+/5, to improve ability for gait and stairs   04/29/21 INITIAL  4 Pt to demo improved score on TUG (TBD)   04/29/21 INITIAL  5 Pt to demo improved score on 6 min walk test (TBD)   04/29/21 INITIAL     PLAN: PT FREQUENCY: 1-2x/week  PT DURATION: 6 weeks  PLANNED INTERVENTIONS: Therapeutic exercises, Therapeutic activity, Neuro Muscular re-education, Balance training, Gait training, Patient/Family education, Joint mobilization, Stair training, Vestibular training, Visual/preceptual remediation/compensation, DME instructions, Dry Needling, Spinal mobilization, Cryotherapy, Moist heat, Taping, Traction, and Manual therapy  PLAN FOR NEXT SESSION:   Lyndee Hensen, PT, DPT 1:02 PM  04/27/21

## 2021-04-28 ENCOUNTER — Other Ambulatory Visit (HOSPITAL_BASED_OUTPATIENT_CLINIC_OR_DEPARTMENT_OTHER): Payer: Self-pay

## 2021-04-28 ENCOUNTER — Encounter (HOSPITAL_BASED_OUTPATIENT_CLINIC_OR_DEPARTMENT_OTHER): Payer: Self-pay | Admitting: Pharmacist

## 2021-04-29 ENCOUNTER — Encounter: Payer: Medicare Other | Admitting: Physical Therapy

## 2021-04-30 DIAGNOSIS — H50111 Monocular exotropia, right eye: Secondary | ICD-10-CM | POA: Diagnosis not present

## 2021-04-30 DIAGNOSIS — H2513 Age-related nuclear cataract, bilateral: Secondary | ICD-10-CM | POA: Diagnosis not present

## 2021-04-30 DIAGNOSIS — H34231 Retinal artery branch occlusion, right eye: Secondary | ICD-10-CM | POA: Diagnosis not present

## 2021-05-04 ENCOUNTER — Other Ambulatory Visit: Payer: Self-pay

## 2021-05-04 ENCOUNTER — Ambulatory Visit (INDEPENDENT_AMBULATORY_CARE_PROVIDER_SITE_OTHER): Payer: Medicare Other | Admitting: Physical Therapy

## 2021-05-04 ENCOUNTER — Other Ambulatory Visit (HOSPITAL_BASED_OUTPATIENT_CLINIC_OR_DEPARTMENT_OTHER): Payer: Self-pay

## 2021-05-04 ENCOUNTER — Encounter: Payer: Self-pay | Admitting: Internal Medicine

## 2021-05-04 ENCOUNTER — Encounter (HOSPITAL_BASED_OUTPATIENT_CLINIC_OR_DEPARTMENT_OTHER): Payer: Self-pay | Admitting: Pharmacist

## 2021-05-04 DIAGNOSIS — M6281 Muscle weakness (generalized): Secondary | ICD-10-CM | POA: Diagnosis not present

## 2021-05-04 DIAGNOSIS — R2689 Other abnormalities of gait and mobility: Secondary | ICD-10-CM | POA: Diagnosis not present

## 2021-05-04 DIAGNOSIS — M545 Low back pain, unspecified: Secondary | ICD-10-CM | POA: Diagnosis not present

## 2021-05-04 MED ORDER — HYDROCODONE-ACETAMINOPHEN 5-325 MG PO TABS
ORAL_TABLET | ORAL | 0 refills | Status: DC
Start: 2021-05-04 — End: 2021-06-09
  Filled 2021-05-04: qty 120, fill #0
  Filled 2021-05-04: qty 120, 30d supply, fill #0

## 2021-05-04 NOTE — Telephone Encounter (Signed)
Epic LR: 03/25/2021 ?No Contract on File.  ?

## 2021-05-04 NOTE — Telephone Encounter (Signed)
Pended Rx and sent to Dr. Gupta for approval.  

## 2021-05-04 NOTE — Therapy (Signed)
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   PT End of Session - 05/05/21 1158     Visit Number 10    Number of Visits 12    Date for PT Re-Evaluation 06/15/21    Authorization Type Medicare re-cert done at visit 10.    PT Start Time 1217    PT Stop Time 1300    PT Time Calculation (min) 43 min    Activity Tolerance Patient tolerated treatment well    Behavior During Therapy WFL for tasks assessed/performed                    Past Medical History:  Diagnosis Date   Aortic stenosis    moderate aortic stenosis   Arthritis    Benign prostatic hypertrophy    Chronic systolic heart failure (HCC)    Chronotropic incompetence with sinus node dysfunction (HCC)    Status post Guidant dual-mode, dual-pacing, dual-sensing  pacemaker   implantation now programmed to AAI with recent generator change.   Coronary artery disease    status post multiple prior percutaneous coronary interventions, microvascular angina per Dr Olevia Perches   Diverticulitis sigmoid colon recurrent    Dysfunctional autonomic nervous system    Dyspnea    Heart murmur    History of primary hypertension    Hypercoagulable state ([place])    chronically anticoagulated with coumadin   Hyperlipidemia    Hyperthyroidism    Hypothyroidism    Dr. 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 Lia Foyer,    Prediabetes 09/21/2017   Stroke ([place])    1990   Past Surgical History:  Procedure Laterality Date   AORTIC VALVE REPLACEMENT  03/15/2011   Procedure: AORTIC VALVE REPLACEMENT (AVR);  Surgeon: Gaye Pollack, MD;  Location: [place];  Service: Open Heart Surgery;  Laterality: N/A;   APPENDECTOMY     CARDIAC CATHETERIZATION     11   CARDIOVERSION     CARDIOVERSION  04/15/2011    Procedure: CARDIOVERSION;  Surgeon: Loralie Champagne, MD;  Location: [place];  Service: Cardiovascular;  Laterality: N/A;   CARDIOVERSION N/A 09/11/2014   Procedure: CARDIOVERSION;  Surgeon: Sueanne Margarita, MD;  Location: [facility];  Service: Cardiovascular;  Laterality: N/A;   CARDIOVERSION N/A 06/27/2015   Procedure: CARDIOVERSION;  Surgeon: Thayer Headings, MD;  Location: [place];  Service: Cardiovascular;  Laterality: N/A;   CARDIOVERSION N/A 07/04/2015   Procedure: CARDIOVERSION;  Surgeon: Evans Lance, MD;  Location: [place];  Service: Cardiovascular;  Laterality: N/A;   CARDIOVERSION N/A 04/13/2017   Procedure: CARDIOVERSION;  Surgeon: Sueanne Margarita, MD;  Location: [facility];  Service: Cardiovascular;  Laterality: N/A;   COLONOSCOPY     CORONARY STENT INTERVENTION N/A 10/15/2019   Procedure: CORONARY STENT INTERVENTION;  Surgeon: Jettie Booze, MD;  Location: [place] [facility] LAB;  Service: Cardiovascular;  Laterality: N/A;   EP IMPLANTABLE DEVICE N/A 06/16/2015   Procedure: Pacemaker Implant;  Surgeon: Evans Lance, MD;  Location: [place] [facility];  Service: Cardiovascular;  Laterality: N/A;   hemrrhoidectomy     LEFT AND RIGHT HEART CATHETERIZATION WITH CORONARY ANGIOGRAM Bilateral 02/01/2011   Procedure: LEFT AND RIGHT HEART CATHETERIZATION WITH CORONARY ANGIOGRAM;  Surgeon: Hillary Bow, MD;  Location:  [place] CATH LAB;  Service: Cardiovascular;  Laterality: Bilateral;   LEFT HEART CATH AND CORONARY ANGIOGRAPHY N/A 10/15/2019   Procedure: LEFT HEART CATH AND CORONARY ANGIOGRAPHY;  Surgeon: Jettie Booze, MD;  Location: [place] CV LAB;  Service: Cardiovascular;  Laterality: N/A;   LEFT HEART CATH AND CORONARY ANGIOGRAPHY N/A 10/07/2020   Procedure: LEFT HEART CATH AND CORONARY ANGIOGRAPHY;  Surgeon: Sherren Mocha, MD;  Location: [place] CV LAB;  Service: Cardiovascular;  Laterality: N/A;   LUMBAR LAMINECTOMY/DECOMPRESSION MICRODISCECTOMY Right  02/23/2021   Procedure: Right Lumbar four-five microdiscectomy;  Surgeon: Eustace Moore, MD;  Location: [place];  Service: Neurosurgery;  Laterality: Right;   MAZE  03/15/2011   Procedure: MAZE;  Surgeon: Gaye Pollack, MD;  Location: [place];  Service: Open Heart Surgery;  Laterality: N/A;   PACEMAKER INSERTION  1991   Guidant PPM, most recent Generator Change by Dr Olevia Perches was 08/22/06   RIGHT/LEFT HEART CATH AND CORONARY ANGIOGRAPHY N/A 07/06/2016   Procedure: Right/Left Heart Cath and Coronary Angiography;  Surgeon: Sherren Mocha, MD;  Location: [place] [facility];  Service: Cardiovascular;  Laterality: N/A;   TEE WITHOUT CARDIOVERSION  04/15/2011   Procedure: TRANSESOPHAGEAL ECHOCARDIOGRAM (TEE);  Surgeon: Loralie Champagne, MD;  Location: [place];  Service: Cardiovascular;  Laterality: N/A;   TEE WITHOUT CARDIOVERSION N/A 09/11/2014   Procedure: TRANSESOPHAGEAL ECHOCARDIOGRAM (TEE);  Surgeon: Sueanne Margarita, MD;  Location: [facility]COPY;  Service: Cardiovascular;  Laterality: N/A;   Patient Active Problem List   Diagnosis Date Noted   Central retinal artery occlusion 04/13/2021   Retinal artery branch occlusion of right eye 04/12/2021   Essential hypertension 04/12/2021   Mass of orbit 04/12/2021   Pure hypercholesterolemia 09/08/2020   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[place]) 03/70/4888   Chronic systolic heart failure ([place]) 02/27/2020   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   Urinary retention with incomplete bladder emptying 12/05/2019   Physical deconditioning 12/05/2019   Other hemorrhoids 12/05/2019   Adjustment reaction with anxiety 12/05/2019   A-fib ([place]) 10/14/2019   Atrial fibrillation with rapid ventricular response ([place]) 10/13/2019   NSTEMI (non-ST elevated myocardial infarction) ([place]) 10/13/2019   Acute diarrhea 10/13/2019   Normocytic anemia 10/13/2019   Diverticulitis sigmoid colon recurrent    Prediabetes 09/21/2017   Persistent atrial  fibrillation ([place]) 04/11/2017   Vitamin D deficiency 05/20/2016   History of pacemaker 05/20/2016   History of CVA (cerebrovascular accident) 05/20/2016   Primary osteoarthritis of both hands 03/10/2016   DDD cervical spine 03/10/2016   Osteoarthritis of lumbar spine 03/10/2016   Chronic left shoulder pain 03/10/2016   Trochanteric bursitis of right hip 03/10/2016   Other fatigue 03/10/2016   Claudication in peripheral vascular disease ([place]) 03/10/2016   Chronic pain syndrome 03/10/2016   Ocular myasthenia gravis ([place]) 10/28/2015   Typical atrial flutter (HCC)    PVC's (premature ventricular contractions) 01/30/2015   Pacemaker 04/30/2013   IgM monoclonal gammopathy of uncertain significance 02/17/2013   Orthostatic hypotension 04/11/2011   Long term current use of anticoagulant therapy 03/24/2011   S/P AVR (aortic valve replacement) 03/24/2011   Pleural effusion 03/24/2011   Hypothyroidism 11/22/2010   Atrial flutter ([place]) 08/19/2010   Syndrome X (cardiac) ([place]) 04/06/2010   Mixed hyperlipidemia 12/08/2007   PRIMARY HYPERCOAGULABLE STATE 12/08/2007   Coronary artery disease with exertional angina ([place]) 12/08/2007   Coronary artery disease of native artery of native heart with stable angina pectoris ([place]) 12/08/2007   AORTIC STENOSIS/ INSUFFICIENCY, NON-RHEUMATIC 12/08/2007  Atrial fibrillation ([place]) 12/08/2007   Chronotropic incompetence with sinus node dysfunction ([place]) 12/08/2007    PCP: [name], MD  REFERRING PROVIDER: Sherley Bounds  REFERRING DIAG: S/P lumbar microdiscectomy   THERAPY DIAG:  Other abnormalities of gait and mobility  Muscle weakness (generalized)  Acute bilateral low back pain without sciatica  ONSET DATE: 02/23/21  SUBJECTIVE:                                                                                                                                                                                           SUBJECTIVE STATEMENT: Pt  with no new complaints. He is still having intermittent dizziness at times. Still feeling weak, but has been able to do a bit more walking at home. Back is doing very well, only has mild soreness from time to time.   PERTINENT HISTORY:  Afib, Chronic Heart Failure , Orthostatic Hypotension, OA (shoulders, hands, hips), Pacemaker,   PAIN:  Are you having pain? Yes NPRS scale: 3/10 Pain location: Low back  Pain orientation: Right  PAIN TYPE: aching Pain description: intermittent and constant  Aggravating factors: increased activity, standing, walking Relieving factors: sitting, resting.   PRECAUTIONS: Back and ICD/Pacemaker  WEIGHT BEARING RESTRICTIONS No  FALLS:  Has patient fallen in last 6 months? No, Number of falls: 0  LIVING ENVIRONMENT: Lives with: lives with their spouse Lives in: House/apartment Stairs: No;  Has following equipment at home: Single point cane   PLOF: Independent  PATIENT GOALS  general mobility, strengthening, increased ability for walking, endurance, feels limited by A-Fib.    OBJECTIVE:   COGNITION:  Overall cognitive status: Within functional limits for tasks assessed    PALPATION: Fully healed incision at lumbar spine  LUMBARAROM/PROM  A/PROM A/PROM  03/18/2021 AROM 05/04/21  Flexion Mod deficit  Mild deficit  Extension Mod deficit Mid deficit  Right lateral flexion Mild/mod deficit  WFL  Left lateral flexion Mild/mod deficit  WFL  Right rotation    Left rotation     (Blank rows = not tested)  LE AROM/PROM:  Hips: mild deficits for flex and rotation. Knees: WFL   LE MMT:  Hips: 4/5;  Knee: 4+/5    [facility] PT Assessment - 05/05/21 0001       Standardized Balance Assessment   Standardized Balance Assessment Berg Balance Test      Berg Balance Test   Sit to Stand Able to stand without using hands and stabilize independently    Standing Unsupported Able to stand safely 2 minutes    Sitting with Back Unsupported but Feet Supported  on Floor or Stool Able to sit  safely and securely 2 minutes    Stand to Sit Sits safely with minimal use of hands    Transfers Able to transfer safely, minor use of hands    Standing Unsupported with Eyes Closed Able to stand 10 seconds safely    Standing Unsupported with Feet Together Able to place feet together independently but unable to hold for 30 seconds    From Standing, Reach Forward with Outstretched Arm Can reach forward >12 cm safely (5")    From Standing Position, Pick up Object from Floor Able to pick up shoe safely and easily    From Standing Position, Turn to Look Behind Over each Shoulder Turn sideways only but maintains balance    Turn 360 Degrees Able to turn 360 degrees safely one side only in 4 seconds or less    Standing Unsupported, Alternately Place Feet on Step/Stool Able to complete 4 steps without aid or supervision    Standing Unsupported, One Foot in Front Able to plae foot ahead of the other independently and hold 30 seconds    Standing on One Leg Tries to lift leg/unable to hold 3 seconds but remains standing independently    Total Score 44                Todays Treatment:  05/04/21: Aerobic: L2 , 2 min on/ 1 min off-rest,  x 3 rounds.  Supine:   Seated: sit to stand x 10 , S/L:   Standing:  Marching x20;  Heel raises x 20;  Mini Squats x 20;  Stretches:  HSS 30 sec x 3;  Neuromuscular Re-education: fwd and bwd stepping with weight shifts x 20 bil;  toe taps on 6 in step x 20; Tandem stance 20 sec x 3 bil; Rhomberg stance 30 sec x 2; DLS with head turns L/R x 15;    PATIENT EDUCATION:  Education details: reviewed HEP, POC Person educated: Patient Education method: Explanation and Verbal cues Education comprehension: verbalized understanding, returned demonstration, verbal cues required, and needs further education   HOME EXERCISE PROGRAM:  Access Code: [email or web address]    ASSESSMENT:  CLINICAL IMPRESSION: 07/03/07: Re-Cert:  Pt doing much better  with back pain and mobility. Minimal pain with activity at this time. He has shown some improvements with ability for ther ex. He is still very limited with exercise tolerance for standing activity, due to fatigue, and requires several seated rest breaks. Pt with only mild deficits with static balance today, but increased difficulty with dynamic balance. He will benefit from continued care, for continued LE strength, endurance, and progression of dynamic balance.    Objective impairments include decreased activity tolerance, decreased balance, decreased coordination, decreased endurance, decreased mobility, difficulty walking, decreased ROM, decreased strength, decreased safety awareness, dizziness, increased muscle spasms, impaired flexibility, improper body mechanics, and pain. These impairments are limiting patient from cleaning, community activity, driving, and shopping. Personal factors including : cardiac, are also affecting patient's functional outcome. Patient will benefit from skilled PT to address above impairments and improve overall function.  REHAB POTENTIAL: Fair   CLINICAL DECISION MAKING: Stable/uncomplicated  EVALUATION COMPLEXITY: Low   GOALS: Goals reviewed with patient? Yes  SHORT TERM GOALS:  STG Name Target Date Goal status  1 Patient will be independent with initial HEP   04/01/21 MET  2 Pt to demo independence with log roll transfer to protect back.  04/01/21 MET         LONG TERM GOALS:   LTG Name Target Date Goal  status  1 Patient will be independent with final HEP  06/15/21 Partially met  2 Pt to report decreased pain in back and LEs to 0-2/10 with walking and activity.    MET  3 Pt to demonstrate improved strength of bil LEs to be at least 4+/5, to improve ability for gait and stairs   06/15/21 Partially met  4 Pt to demo improved score on BERG by at least  4 points  06/15/21 Revised   5 Pt to demo improved score on DGI to be [facility] for pt age   06/15/21 Revised      PLAN: PT FREQUENCY: 1-2x/week  PT DURATION: 6 weeks  PLANNED INTERVENTIONS: Therapeutic exercises, Therapeutic activity, Neuro Muscular re-education, Balance training, Gait training, Patient/Family education, Joint mobilization, Stair training, Vestibular training, Visual/preceptual remediation/compensation, DME instructions, Dry Needling, Spinal mobilization, Cryotherapy, Moist heat, Taping, Traction, and Manual therapy  PLAN FOR NEXT SESSION:   Lyndee Hensen, PT, DPT 11:59 AM  05/05/21

## 2021-05-05 ENCOUNTER — Encounter: Payer: Self-pay | Admitting: Physical Therapy

## 2021-05-06 ENCOUNTER — Encounter: Payer: Self-pay | Admitting: Physical Therapy

## 2021-05-06 ENCOUNTER — Ambulatory Visit (INDEPENDENT_AMBULATORY_CARE_PROVIDER_SITE_OTHER): Payer: Medicare Other | Admitting: Physical Therapy

## 2021-05-06 DIAGNOSIS — M6281 Muscle weakness (generalized): Secondary | ICD-10-CM | POA: Diagnosis not present

## 2021-05-06 DIAGNOSIS — R2689 Other abnormalities of gait and mobility: Secondary | ICD-10-CM | POA: Diagnosis not present

## 2021-05-06 DIAGNOSIS — M545 Low back pain, unspecified: Secondary | ICD-10-CM

## 2021-05-06 NOTE — Therapy (Addendum)
OUTPATIENT PHYSICAL THERAPY TREATMENT    Patient Name: Kevin PAPROCKI, MD MRN: [id number] DOB:June 09, 1934, 87 y.o., male Today's Date: 05/06/2021  .   PT End of Session - 05/06/21 1214     Visit Number 11    Number of Visits 12    Date for PT Re-Evaluation 06/15/21    Authorization Type Medicare re-cert done at visit 10.    PT Start Time 1215    PT Stop Time 1257    PT Time Calculation (min) 42 min    Activity Tolerance Patient tolerated treatment well    Behavior During Therapy WFL for tasks assessed/performed                    Past Medical History:  Diagnosis Date   Aortic stenosis    moderate aortic stenosis   Arthritis    Benign prostatic hypertrophy    Chronic systolic heart failure (HCC)    Chronotropic incompetence with sinus node dysfunction (HCC)    Status post Guidant dual-mode, dual-pacing, dual-sensing  pacemaker   implantation now programmed to AAI with recent generator change.   Coronary artery disease    status post multiple prior percutaneous coronary interventions, microvascular angina per Dr Olevia Perches   Diverticulitis sigmoid colon recurrent    Dysfunctional autonomic nervous system    Dyspnea    Heart murmur    History of primary hypertension    Hypercoagulable state ([place])    chronically anticoagulated with coumadin   Hyperlipidemia    Hyperthyroidism    Hypothyroidism    Dr. 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 Lia Foyer,    Prediabetes 09/21/2017   Stroke ([place])    1990   Past Surgical History:  Procedure Laterality Date   AORTIC VALVE REPLACEMENT  03/15/2011   Procedure: AORTIC VALVE REPLACEMENT (AVR);  Surgeon: Gaye Pollack, MD;  Location: [facility];  Service: Open Heart Surgery;  Laterality: N/A;   APPENDECTOMY     CARDIAC CATHETERIZATION     11   CARDIOVERSION     CARDIOVERSION  04/15/2011    Procedure: CARDIOVERSION;  Surgeon: Loralie Champagne, MD;  Location: [facility];  Service: Cardiovascular;  Laterality: N/A;   CARDIOVERSION N/A 09/11/2014   Procedure: CARDIOVERSION;  Surgeon: Sueanne Margarita, MD;  Location: [facility];  Service: Cardiovascular;  Laterality: N/A;   CARDIOVERSION N/A 06/27/2015   Procedure: CARDIOVERSION;  Surgeon: Thayer Headings, MD;  Location: [facility];  Service: Cardiovascular;  Laterality: N/A;   CARDIOVERSION N/A 07/04/2015   Procedure: CARDIOVERSION;  Surgeon: Evans Lance, MD;  Location: [facility];  Service: Cardiovascular;  Laterality: N/A;   CARDIOVERSION N/A 04/13/2017   Procedure: CARDIOVERSION;  Surgeon: Sueanne Margarita, MD;  Location: [facility];  Service: Cardiovascular;  Laterality: N/A;   COLONOSCOPY     CORONARY STENT INTERVENTION N/A 10/15/2019   Procedure: CORONARY STENT INTERVENTION;  Surgeon: Jettie Booze, MD;  Location: [facility];  Service: Cardiovascular;  Laterality: N/A;   EP IMPLANTABLE DEVICE N/A 06/16/2015   Procedure: Pacemaker Implant;  Surgeon: Evans Lance, MD;  Location: [facility];  Service: Cardiovascular;  Laterality: N/A;   hemrrhoidectomy     LEFT AND RIGHT HEART CATHETERIZATION WITH CORONARY ANGIOGRAM Bilateral 02/01/2011   Procedure: LEFT AND RIGHT HEART CATHETERIZATION WITH CORONARY ANGIOGRAM;  Surgeon: Hillary Bow, MD;  Location:  [facility];  Service: Cardiovascular;  Laterality: Bilateral;   LEFT HEART CATH AND CORONARY ANGIOGRAPHY N/A 10/15/2019   Procedure: LEFT HEART CATH AND CORONARY ANGIOGRAPHY;  Surgeon: Jettie Booze, MD;  Location: [facility];  Service: Cardiovascular;  Laterality: N/A;   LEFT HEART CATH AND CORONARY ANGIOGRAPHY N/A 10/07/2020   Procedure: LEFT HEART CATH AND CORONARY ANGIOGRAPHY;  Surgeon: Sherren Mocha, MD;  Location: [facility];  Service: Cardiovascular;  Laterality: N/A;   LUMBAR LAMINECTOMY/DECOMPRESSION MICRODISCECTOMY Right  02/23/2021   Procedure: Right Lumbar four-five microdiscectomy;  Surgeon: Eustace Moore, MD;  Location: [facility];  Service: Neurosurgery;  Laterality: Right;   MAZE  03/15/2011   Procedure: MAZE;  Surgeon: Gaye Pollack, MD;  Location: [facility];  Service: Open Heart Surgery;  Laterality: N/A;   PACEMAKER INSERTION  1991   Guidant PPM, most recent Generator Change by Dr Olevia Perches was 08/22/06   RIGHT/LEFT HEART CATH AND CORONARY ANGIOGRAPHY N/A 07/06/2016   Procedure: Right/Left Heart Cath and Coronary Angiography;  Surgeon: Sherren Mocha, MD;  Location: [facility];  Service: Cardiovascular;  Laterality: N/A;   TEE WITHOUT CARDIOVERSION  04/15/2011   Procedure: TRANSESOPHAGEAL ECHOCARDIOGRAM (TEE);  Surgeon: Loralie Champagne, MD;  Location: [facility];  Service: Cardiovascular;  Laterality: N/A;   TEE WITHOUT CARDIOVERSION N/A 09/11/2014   Procedure: TRANSESOPHAGEAL ECHOCARDIOGRAM (TEE);  Surgeon: Sueanne Margarita, MD;  Location: [facility];  Service: Cardiovascular;  Laterality: N/A;   Patient Active Problem List   Diagnosis Date Noted   Central retinal artery occlusion 04/13/2021   Retinal artery branch occlusion of right eye 04/12/2021   Essential hypertension 04/12/2021   Mass of orbit 04/12/2021   Pure hypercholesterolemia 09/08/2020   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[place]) 03/70/4888   Chronic systolic heart failure ([place]) 02/27/2020   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   Urinary retention with incomplete bladder emptying 12/05/2019   Physical deconditioning 12/05/2019   Other hemorrhoids 12/05/2019   Adjustment reaction with anxiety 12/05/2019   A-fib ([place]) 10/14/2019   Atrial fibrillation with rapid ventricular response ([place]) 10/13/2019   NSTEMI (non-ST elevated myocardial infarction) ([place]) 10/13/2019   Acute diarrhea 10/13/2019   Normocytic anemia 10/13/2019   Diverticulitis sigmoid colon recurrent    Prediabetes 09/21/2017   Persistent atrial  fibrillation ([place]) 04/11/2017   Vitamin D deficiency 05/20/2016   History of pacemaker 05/20/2016   History of CVA (cerebrovascular accident) 05/20/2016   Primary osteoarthritis of both hands 03/10/2016   DDD cervical spine 03/10/2016   Osteoarthritis of lumbar spine 03/10/2016   Chronic left shoulder pain 03/10/2016   Trochanteric bursitis of right hip 03/10/2016   Other fatigue 03/10/2016   Claudication in peripheral vascular disease ([place]) 03/10/2016   Chronic pain syndrome 03/10/2016   Ocular myasthenia gravis ([place]) 10/28/2015   Typical atrial flutter (HCC)    PVC's (premature ventricular contractions) 01/30/2015   Pacemaker 04/30/2013   IgM monoclonal gammopathy of uncertain significance 02/17/2013   Orthostatic hypotension 04/11/2011   Long term current use of anticoagulant therapy 03/24/2011   S/P AVR (aortic valve replacement) 03/24/2011   Pleural effusion 03/24/2011   Hypothyroidism 11/22/2010   Atrial flutter ([place]) 08/19/2010   Syndrome X (cardiac) ([place]) 04/06/2010   Mixed hyperlipidemia 12/08/2007   PRIMARY HYPERCOAGULABLE STATE 12/08/2007   Coronary artery disease with exertional angina ([place]) 12/08/2007   Coronary artery disease of native artery of native heart with stable angina pectoris ([place]) 12/08/2007   AORTIC STENOSIS/ INSUFFICIENCY, NON-RHEUMATIC 12/08/2007  Atrial fibrillation ([place]) 12/08/2007   Chronotropic incompetence with sinus node dysfunction ([place]) 12/08/2007    PCP: [name], MD  REFERRING PROVIDER: Sherley Bounds  REFERRING DIAG: S/P lumbar microdiscectomy   THERAPY DIAG:  Other abnormalities of gait and mobility  Muscle weakness (generalized)  Acute bilateral low back pain without sciatica  ONSET DATE: 02/23/21  SUBJECTIVE:                                                                                                                                                                                           SUBJECTIVE STATEMENT: Pt  with no new complaints. Has been able to walk a bit more this week.   PERTINENT HISTORY:  Afib, Chronic Heart Failure , Orthostatic Hypotension, OA (shoulders, hands, hips), Pacemaker,   PAIN:  Are you having pain? Yes NPRS scale: 3/10 Pain location: Low back  Pain orientation: Right  PAIN TYPE: aching Pain description: intermittent and constant  Aggravating factors: increased activity, standing, walking Relieving factors: sitting, resting.   PRECAUTIONS: Back and ICD/Pacemaker  WEIGHT BEARING RESTRICTIONS No  FALLS:  Has patient fallen in last 6 months? No, Number of falls: 0  LIVING ENVIRONMENT: Lives with: lives with their spouse Lives in: House/apartment Stairs: No;  Has following equipment at home: Single point cane   PLOF: Independent  PATIENT GOALS  general mobility, strengthening, increased ability for walking, endurance, feels limited by A-Fib.    OBJECTIVE:   COGNITION:  Overall cognitive status: Within functional limits for tasks assessed    PALPATION: Fully healed incision at lumbar spine  LUMBARAROM/PROM  A/PROM A/PROM  03/18/2021 AROM 05/04/21  Flexion Mod deficit  Mild deficit  Extension Mod deficit Mid deficit  Right lateral flexion Mild/mod deficit  WFL  Left lateral flexion Mild/mod deficit  WFL  Right rotation    Left rotation     (Blank rows = not tested)  LE AROM/PROM:  Hips: mild deficits for flex and rotation. Knees: WFL   LE MMT:  Hips: 4/5;  Knee: 4+/5         Todays Treatment: 05/06/21: Aerobic: L2 , 2 min on/ 1 min off-rest, x 4 rounds.  Supine:   Seated:  S/L:   Standing:  Marching x20;   Squats x20  Stretches:   Neuromuscular Re-education: lateral and fwd and bwd stepping with weight shifts x 20 bil;  toe taps on 6 in step x 20;  HR with post weight shifts x 20; Tandem stance 20 sec x 3 bil; Quick walking 35 ft x 6; Stepping up/over cones 20 ft x 6; Walking around cones 20 ft x 6;  05/04/21: Aerobic: L2 , 2 min  on/ 1 min off-rest,  x 3 rounds.  Supine:   Seated: sit to stand x 10 , S/L:   Standing:  Marching x20;  Heel raises x 20;  Mini Squats x 20;  Stretches:  HSS 30 sec x 3;  Neuromuscular Re-education: fwd and bwd stepping with weight shifts x 20 bil;  toe taps on 6 in step x 20; Tandem stance 20 sec x 3 bil;     PATIENT EDUCATION:  Education details: reviewed HEP, POC Person educated: Patient Education method: Explanation and Verbal cues Education comprehension: verbalized understanding, returned demonstration, verbal cues required, and needs further education   HOME EXERCISE PROGRAM:  Access Code: [email or web address]    ASSESSMENT:  CLINICAL IMPRESSION: 05/06/21: Pt able to increase time on bike today, still given intermittent rest breaks, but did not need to stop due to being out of breath. Pt cued to slow exercises today, for increased energy conservation. Pt to benefit from continued strengthening and balance.   Objective impairments include decreased activity tolerance, decreased balance, decreased coordination, decreased endurance, decreased mobility, difficulty walking, decreased ROM, decreased strength, decreased safety awareness, dizziness, increased muscle spasms, impaired flexibility, improper body mechanics, and pain. These impairments are limiting patient from cleaning, community activity, driving, and shopping. Personal factors including : cardiac, are also affecting patient's functional outcome. Patient will benefit from skilled PT to address above impairments and improve overall function.  REHAB POTENTIAL: Fair   CLINICAL DECISION MAKING: Stable/uncomplicated  EVALUATION COMPLEXITY: Low   GOALS: Goals reviewed with patient? Yes  SHORT TERM GOALS:  STG Name Target Date Goal status  1 Patient will be independent with initial HEP   04/01/21 MET  2 Pt to demo independence with log roll transfer to protect back.  04/01/21 MET         LONG TERM GOALS:   LTG Name Target Date  Goal status  1 Patient will be independent with final HEP  06/15/21 Partially met  2 Pt to report decreased pain in back and LEs to 0-2/10 with walking and activity.    MET  3 Pt to demonstrate improved strength of bil LEs to be at least 4+/5, to improve ability for gait and stairs   06/15/21 Partially met  4 Pt to demo improved score on BERG by at least  4 points  06/15/21 Revised   5 Pt to demo improved score on DGI to be [facility] for pt age   06/15/21 Revised     PLAN: PT FREQUENCY: 1-2x/week  PT DURATION: 6 weeks  PLANNED INTERVENTIONS: Therapeutic exercises, Therapeutic activity, Neuro Muscular re-education, Balance training, Gait training, Patient/Family education, Joint mobilization, Stair training, Vestibular training, Visual/preceptual remediation/compensation, DME instructions, Dry Needling, Spinal mobilization, Cryotherapy, Moist heat, Taping, Traction, and Manual therapy  PLAN FOR NEXT SESSION:   Lyndee Hensen, PT, DPT 2:03 PM  05/06/21

## 2021-05-07 ENCOUNTER — Other Ambulatory Visit (HOSPITAL_BASED_OUTPATIENT_CLINIC_OR_DEPARTMENT_OTHER): Payer: Self-pay

## 2021-05-08 ENCOUNTER — Other Ambulatory Visit (HOSPITAL_BASED_OUTPATIENT_CLINIC_OR_DEPARTMENT_OTHER): Payer: Self-pay

## 2021-05-08 ENCOUNTER — Ambulatory Visit (INDEPENDENT_AMBULATORY_CARE_PROVIDER_SITE_OTHER): Payer: Medicare Other

## 2021-05-08 DIAGNOSIS — I495 Sick sinus syndrome: Secondary | ICD-10-CM | POA: Diagnosis not present

## 2021-05-08 LAB — CUP PACEART REMOTE DEVICE CHECK
Battery Remaining Longevity: 54 mo
Battery Remaining Percentage: 54 %
Brady Statistic RA Percent Paced: 0 %
Brady Statistic RV Percent Paced: 63 %
Date Time Interrogation Session: 20230310021300
Implantable Lead Implant Date: 20170417
Implantable Lead Implant Date: 20170417
Implantable Lead Location: 753859
Implantable Lead Location: 753860
Implantable Lead Model: 7740
Implantable Lead Model: 7741
Implantable Lead Serial Number: 662696
Implantable Lead Serial Number: 751382
Implantable Pulse Generator Implant Date: 20170417
Lead Channel Impedance Value: 655 Ohm
Lead Channel Impedance Value: 729 Ohm
Lead Channel Setting Pacing Amplitude: 2.5 V
Lead Channel Setting Pacing Amplitude: 2.5 V
Lead Channel Setting Pacing Pulse Width: 0.4 ms
Lead Channel Setting Sensing Sensitivity: 2.5 mV
Pulse Gen Serial Number: 718418

## 2021-05-11 ENCOUNTER — Ambulatory Visit (INDEPENDENT_AMBULATORY_CARE_PROVIDER_SITE_OTHER): Payer: Medicare Other | Admitting: Physical Therapy

## 2021-05-11 DIAGNOSIS — R2689 Other abnormalities of gait and mobility: Secondary | ICD-10-CM

## 2021-05-11 DIAGNOSIS — M6281 Muscle weakness (generalized): Secondary | ICD-10-CM | POA: Diagnosis not present

## 2021-05-11 DIAGNOSIS — M545 Low back pain, unspecified: Secondary | ICD-10-CM | POA: Diagnosis not present

## 2021-05-11 NOTE — Therapy (Incomplete)
OUTPATIENT PHYSICAL THERAPY TREATMENT    Patient Name: TYLIEK TIMBERMAN, MD MRN: [id number] DOB:01/09/1935, 87 y.o., male Today's Date: 05/11/2021  .            Past Medical History:  Diagnosis Date   Aortic stenosis    moderate aortic stenosis   Arthritis    Benign prostatic hypertrophy    Chronic systolic heart failure (HCC)    Chronotropic incompetence with sinus node dysfunction (HCC)    Status post Guidant dual-mode, dual-pacing, dual-sensing  pacemaker   implantation now programmed to AAI with recent generator change.   Coronary artery disease    status post multiple prior percutaneous coronary interventions, microvascular angina per Dr Olevia Perches   Diverticulitis sigmoid colon recurrent    Dysfunctional autonomic nervous system    Dyspnea    Heart murmur    History of primary hypertension    Hypercoagulable state ([place])    chronically anticoagulated with coumadin   Hyperlipidemia    Hyperthyroidism    Hypothyroidism    Dr. Elyse Hsu   MGUS (monoclonal gammopathy of unknown significance) 02/17/2013   Ocular myasthenia ([place])    Osteoarthritis    Paroxysmal atrial fibrillation ([place])    DR. Lia Foyer,    Prediabetes 09/21/2017   Stroke ([place])    1990   Past Surgical History:  Procedure Laterality Date   AORTIC VALVE REPLACEMENT  03/15/2011   Procedure: AORTIC VALVE REPLACEMENT (AVR);  Surgeon: Gaye Pollack, MD;  Location: [facility];  Service: Open Heart Surgery;  Laterality: N/A;   APPENDECTOMY     CARDIAC CATHETERIZATION     11   CARDIOVERSION     CARDIOVERSION  04/15/2011   Procedure: CARDIOVERSION;  Surgeon: Loralie Champagne, MD;  Location: [facility];  Service: Cardiovascular;  Laterality: N/A;   CARDIOVERSION N/A 09/11/2014   Procedure: CARDIOVERSION;  Surgeon: Sueanne Margarita, MD;  Location: [facility] ENDOSCOPY;  Service: Cardiovascular;  Laterality: N/A;   CARDIOVERSION N/A 06/27/2015   Procedure: CARDIOVERSION;  Surgeon: Thayer Headings, MD;  Location: [facility];   Service: Cardiovascular;  Laterality: N/A;   CARDIOVERSION N/A 07/04/2015   Procedure: CARDIOVERSION;  Surgeon: Evans Lance, MD;  Location: [facility];  Service: Cardiovascular;  Laterality: N/A;   CARDIOVERSION N/A 04/13/2017   Procedure: CARDIOVERSION;  Surgeon: Sueanne Margarita, MD;  Location: [facility] ENDOSCOPY;  Service: Cardiovascular;  Laterality: N/A;   COLONOSCOPY     CORONARY STENT INTERVENTION N/A 10/15/2019   Procedure: CORONARY STENT INTERVENTION;  Surgeon: Jettie Booze, MD;  Location: [facility] CV LAB;  Service: Cardiovascular;  Laterality: N/A;   EP IMPLANTABLE DEVICE N/A 06/16/2015   Procedure: Pacemaker Implant;  Surgeon: Evans Lance, MD;  Location: [place] CV LAB;  Service: Cardiovascular;  Laterality: N/A;   hemrrhoidectomy     LEFT AND RIGHT HEART CATHETERIZATION WITH CORONARY ANGIOGRAM Bilateral 02/01/2011   Procedure: LEFT AND RIGHT HEART CATHETERIZATION WITH CORONARY ANGIOGRAM;  Surgeon: Hillary Bow, MD;  Location: [facility] CATH LAB;  Service: Cardiovascular;  Laterality: Bilateral;   LEFT HEART CATH AND CORONARY ANGIOGRAPHY N/A 10/15/2019   Procedure: LEFT HEART CATH AND CORONARY ANGIOGRAPHY;  Surgeon: Jettie Booze, MD;  Location: [place] CV LAB;  Service: Cardiovascular;  Laterality: N/A;   LEFT HEART CATH AND CORONARY ANGIOGRAPHY N/A 10/07/2020   Procedure: LEFT HEART CATH AND CORONARY ANGIOGRAPHY;  Surgeon: Sherren Mocha, MD;  Location: [place] CV LAB;  Service: Cardiovascular;  Laterality: N/A;   LUMBAR LAMINECTOMY/DECOMPRESSION MICRODISCECTOMY Right 02/23/2021   Procedure:  Right Lumbar four-five microdiscectomy;  Surgeon: Eustace Moore, MD;  Location: [facility];  Service: Neurosurgery;  Laterality: Right;   MAZE  03/15/2011   Procedure: MAZE;  Surgeon: Gaye Pollack, MD;  Location: [facility];  Service: Open Heart Surgery;  Laterality: N/A;   PACEMAKER INSERTION  1991   Guidant PPM, most recent Generator Change by Dr Olevia Perches was 08/22/06   RIGHT/LEFT  HEART CATH AND CORONARY ANGIOGRAPHY N/A 07/06/2016   Procedure: Right/Left Heart Cath and Coronary Angiography;  Surgeon: Sherren Mocha, MD;  Location: [facility] CV LAB;  Service: Cardiovascular;  Laterality: N/A;   TEE WITHOUT CARDIOVERSION  04/15/2011   Procedure: TRANSESOPHAGEAL ECHOCARDIOGRAM (TEE);  Surgeon: Loralie Champagne, MD;  Location: [facility];  Service: Cardiovascular;  Laterality: N/A;   TEE WITHOUT CARDIOVERSION N/A 09/11/2014   Procedure: TRANSESOPHAGEAL ECHOCARDIOGRAM (TEE);  Surgeon: Sueanne Margarita, MD;  Location: [facility]OSCOPY;  Service: Cardiovascular;  Laterality: N/A;   Patient Active Problem List   Diagnosis Date Noted   Central retinal artery occlusion 04/13/2021   Retinal artery branch occlusion of right eye 04/12/2021   Essential hypertension 04/12/2021   Mass of orbit 04/12/2021   Pure hypercholesterolemia 09/08/2020   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[place]) 08/25/9483   Chronic systolic heart failure ([place]) 02/27/2020   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   Urinary retention with incomplete bladder emptying 12/05/2019   Physical deconditioning 12/05/2019   Other hemorrhoids 12/05/2019   Adjustment reaction with anxiety 12/05/2019   A-fib ([place]) 10/14/2019   Atrial fibrillation with rapid ventricular response ([place]) 10/13/2019   NSTEMI (non-ST elevated myocardial infarction) ([place]) 10/13/2019   Acute diarrhea 10/13/2019   Normocytic anemia 10/13/2019   Diverticulitis sigmoid colon recurrent    Prediabetes 09/21/2017   Persistent atrial fibrillation ([place]) 04/11/2017   Vitamin D deficiency 05/20/2016   History of pacemaker 05/20/2016   History of CVA (cerebrovascular accident) 05/20/2016   Primary osteoarthritis of both hands 03/10/2016   DDD cervical spine 03/10/2016   Osteoarthritis of lumbar spine 03/10/2016   Chronic left shoulder pain 03/10/2016   Trochanteric bursitis of right hip 03/10/2016   Other fatigue 03/10/2016    Claudication in peripheral vascular disease ([place]) 03/10/2016   Chronic pain syndrome 03/10/2016   Ocular myasthenia gravis ([place]) 10/28/2015   Typical atrial flutter (HCC)    PVC's (premature ventricular contractions) 01/30/2015   Pacemaker 04/30/2013   IgM monoclonal gammopathy of uncertain significance 02/17/2013   Orthostatic hypotension 04/11/2011   Long term current use of anticoagulant therapy 03/24/2011   S/P AVR (aortic valve replacement) 03/24/2011   Pleural effusion 03/24/2011   Hypothyroidism 11/22/2010   Atrial flutter ([place]) 08/19/2010   Syndrome X (cardiac) ([place]) 04/06/2010   Mixed hyperlipidemia 12/08/2007   PRIMARY HYPERCOAGULABLE STATE 12/08/2007   Coronary artery disease with exertional angina ([place]) 12/08/2007   Coronary artery disease of native artery of native heart with stable angina pectoris ([place]) 12/08/2007   AORTIC STENOSIS/ INSUFFICIENCY, NON-RHEUMATIC 12/08/2007   Atrial fibrillation ([place]) 12/08/2007   Chronotropic incompetence with sinus node dysfunction ([place]) 12/08/2007    PCP: [name], MD  REFERRING PROVIDER: Sherley Bounds  REFERRING DIAG: S/P lumbar microdiscectomy   THERAPY DIAG:  No diagnosis found.  ONSET DATE: 02/23/21  SUBJECTIVE:  SUBJECTIVE STATEMENT: Pt with no new complaints. Has been able to walk a bit more this week.   PERTINENT HISTORY:  Afib, Chronic Heart Failure , Orthostatic Hypotension, OA (shoulders, hands, hips), Pacemaker,   PAIN:  Are you having pain? Yes NPRS scale: 3/10 Pain location: Low back  Pain orientation: Right  PAIN TYPE: aching Pain description: intermittent and constant  Aggravating factors: increased activity, standing, walking Relieving factors: sitting, resting.   PRECAUTIONS: Back and ICD/Pacemaker  WEIGHT  BEARING RESTRICTIONS No  FALLS:  Has patient fallen in last 6 months? No, Number of falls: 0  LIVING ENVIRONMENT: Lives with: lives with their spouse Lives in: House/apartment Stairs: No;  Has following equipment at home: Single point cane   PLOF: Independent  PATIENT GOALS  general mobility, strengthening, increased ability for walking, endurance, feels limited by A-Fib.    OBJECTIVE:   COGNITION:  Overall cognitive status: Within functional limits for tasks assessed    PALPATION: Fully healed incision at lumbar spine  LUMBARAROM/PROM  A/PROM A/PROM  03/18/2021 AROM 05/04/21  Flexion Mod deficit  Mild deficit  Extension Mod deficit Mid deficit  Right lateral flexion Mild/mod deficit  WFL  Left lateral flexion Mild/mod deficit  WFL  Right rotation    Left rotation     (Blank rows = not tested)  LE AROM/PROM:  Hips: mild deficits for flex and rotation. Knees: WFL   LE MMT:  Hips: 4/5;  Knee: 4+/5         Todays Treatment:  05/11/21: Aerobic: L2 , 2 min on/ 1 min off-rest, x 4 rounds.  Supine:   Seated:  S/L:   Standing:  Marching x20;   Squats x20  Stretches:   Neuromuscular Re-education: lateral and fwd and bwd stepping with weight shifts x 20 bil;  toe taps on 6 in step x 20;  HR with post weight shifts x 20; Tandem stance 20 sec x 3 bil;  Quick walking 35 ft x 6 practice for shorter step length on R;    05/06/21: Aerobic: L2 , 2 min on/ 1 min off-rest, x 4 rounds.  Supine:   Seated:  S/L:   Standing:  Marching x20;   Squats x20  Stretches:   Neuromuscular Re-education: lateral and fwd and bwd stepping with weight shifts x 20 bil;  toe taps on 6 in step x 20;  HR with post weight shifts x 20; Tandem stance 20 sec x 3 bil; Quick walking 35 ft x 6; Stepping up/over cones 20 ft x 6; Walking around cones 20 ft x 6;     05/04/21: Aerobic: L2 , 2 min on/ 1 min off-rest,  x 3 rounds.  Supine:   Seated: sit to stand x 10 , S/L:   Standing:  Marching  x20;  Heel raises x 20;  Mini Squats x 20;  Stretches:  HSS 30 sec x 3;  Neuromuscular Re-education: fwd and bwd stepping with weight shifts x 20 bil;  toe taps on 6 in step x 20; Tandem stance 20 sec x 3 bil;     PATIENT EDUCATION:  Education details: reviewed HEP, POC Person educated: Patient Education method: Explanation and Verbal cues Education comprehension: verbalized understanding, returned demonstration, verbal cues required, and needs further education   HOME EXERCISE PROGRAM:  Access Code: [email or web address]    ASSESSMENT:  CLINICAL IMPRESSION: 05/06/21: Pt able to increase time on bike today, still given intermittent rest breaks, but did not need to stop due to being out of  breath. Pt cued to slow exercises today, for increased energy conservation. Pt to benefit from continued strengthening and balance.   Objective impairments include decreased activity tolerance, decreased balance, decreased coordination, decreased endurance, decreased mobility, difficulty walking, decreased ROM, decreased strength, decreased safety awareness, dizziness, increased muscle spasms, impaired flexibility, improper body mechanics, and pain. These impairments are limiting patient from cleaning, community activity, driving, and shopping. Personal factors including : cardiac, are also affecting patient's functional outcome. Patient will benefit from skilled PT to address above impairments and improve overall function.  REHAB POTENTIAL: Fair   CLINICAL DECISION MAKING: Stable/uncomplicated  EVALUATION COMPLEXITY: Low   GOALS: Goals reviewed with patient? Yes  SHORT TERM GOALS:  STG Name Target Date Goal status  1 Patient will be independent with initial HEP   04/01/21 MET  2 Pt to demo independence with log roll transfer to protect back.  04/01/21 MET         LONG TERM GOALS:   LTG Name Target Date Goal status  1 Patient will be independent with final HEP  06/15/21 Partially met  2 Pt to report  decreased pain in back and LEs to 0-2/10 with walking and activity.    MET  3 Pt to demonstrate improved strength of bil LEs to be at least 4+/5, to improve ability for gait and stairs   06/15/21 Partially met  4 Pt to demo improved score on BERG by at least  4 points  06/15/21 Revised   5 Pt to demo improved score on DGI to be [facility] for pt age   06/15/21 Revised     PLAN: PT FREQUENCY: 1-2x/week  PT DURATION: 6 weeks  PLANNED INTERVENTIONS: Therapeutic exercises, Therapeutic activity, Neuro Muscular re-education, Balance training, Gait training, Patient/Family education, Joint mobilization, Stair training, Vestibular training, Visual/preceptual remediation/compensation, DME instructions, Dry Needling, Spinal mobilization, Cryotherapy, Moist heat, Taping, Traction, and Manual therapy  PLAN FOR NEXT SESSION:   Lyndee Hensen, PT, DPT 12:13 PM  05/11/21

## 2021-05-12 ENCOUNTER — Encounter: Payer: Self-pay | Admitting: Physical Therapy

## 2021-05-13 ENCOUNTER — Encounter: Payer: Medicare Other | Admitting: Physical Therapy

## 2021-05-13 NOTE — Progress Notes (Signed)
Remote pacemaker transmission.   

## 2021-05-15 ENCOUNTER — Other Ambulatory Visit (HOSPITAL_BASED_OUTPATIENT_CLINIC_OR_DEPARTMENT_OTHER): Payer: Self-pay

## 2021-05-18 ENCOUNTER — Ambulatory Visit: Payer: Medicare Other | Attending: Neurological Surgery | Admitting: Rehabilitative and Restorative Service Providers"

## 2021-05-18 ENCOUNTER — Other Ambulatory Visit: Payer: Self-pay

## 2021-05-18 ENCOUNTER — Other Ambulatory Visit (HOSPITAL_BASED_OUTPATIENT_CLINIC_OR_DEPARTMENT_OTHER): Payer: Self-pay

## 2021-05-18 DIAGNOSIS — Z8673 Personal history of transient ischemic attack (TIA), and cerebral infarction without residual deficits: Secondary | ICD-10-CM | POA: Diagnosis not present

## 2021-05-18 DIAGNOSIS — R42 Dizziness and giddiness: Secondary | ICD-10-CM | POA: Diagnosis not present

## 2021-05-18 NOTE — Patient Instructions (Signed)
Access Code: [email or web address] ?URL: [email or web address] ?Date: 05/18/2021 ?Prepared by: Rudell Cobb ? ?Exercises ?Standing Balance in Corner with Eyes Closed - 2 x daily - 7 x weekly - 1 sets - 3 reps - 30 seconds hold ?Corner Balance Feet Together: Eyes Open With Head Turns - 2 x daily - 7 x weekly - 1 sets - 10 reps ? ?

## 2021-05-18 NOTE — Therapy (Signed)
[facility] ?[address], [address] ?[place], [place], [address] ?Phone: [phone]   Fax:  [phone] ? ?Physical Therapy Evaluation ? ?Patient Details  ?Name: [name], MD ?MRN: [id number] ?Date of Birth: 1934/10/28 ?Referring Provider (PT): Sherley Bounds, MD ? ? ?Encounter Date: 05/18/2021 ? ? PT End of Session - 05/18/21 1708   ? ? Visit Number 1   ? Number of Visits 1   ? PT Start Time 4196   ? PT Stop Time 1530   ? PT Time Calculation (min) 45 min   ? ?  ?  ? ?  ? ? ?Past Medical History:  ?Diagnosis Date  ? Aortic stenosis   ? moderate aortic stenosis  ? Arthritis   ? Benign prostatic hypertrophy   ? Chronic systolic heart failure ([place])   ? Chronotropic incompetence with sinus node dysfunction (HCC)   ? Status post Guidant dual-mode, dual-pacing, dual-sensing  pacemaker   implantation now programmed to AAI with recent generator change.  ? Coronary artery disease   ? status post multiple prior percutaneous coronary interventions, microvascular angina per Dr Olevia Perches  ? Diverticulitis sigmoid colon recurrent   ? Dysfunctional autonomic nervous system   ? Dyspnea   ? Heart murmur   ? History of primary hypertension   ? Hypercoagulable state ([place])   ? chronically anticoagulated with coumadin  ? Hyperlipidemia   ? Hyperthyroidism   ? Hypothyroidism   ? Dr. Elyse Hsu  ? MGUS (monoclonal gammopathy of unknown significance) 02/17/2013  ? Ocular myasthenia ([place])   ? Osteoarthritis   ? Paroxysmal atrial fibrillation (HCC)   ? DR. Lia Foyer,   ? Prediabetes 09/21/2017  ? Stroke [facility])   ? 1990  ? ? ?Past Surgical History:  ?Procedure Laterality Date  ? AORTIC VALVE REPLACEMENT  03/15/2011  ? Procedure: AORTIC VALVE REPLACEMENT (AVR);  Surgeon: Gaye Pollack, MD;  Location: [place];  Service: Open Heart Surgery;  Laterality: N/A;  ? APPENDECTOMY    ? CARDIAC CATHETERIZATION    ? 11  ? CARDIOVERSION    ? CARDIOVERSION  04/15/2011  ? Procedure: CARDIOVERSION;  Surgeon: Loralie Champagne, MD;   Location: [place] [place];  Service: Cardiovascular;  Laterality: N/A;  ? CARDIOVERSION N/A 09/11/2014  ? Procedure: CARDIOVERSION;  Surgeon: Sueanne Margarita, MD;  Location: [place];  Service: Cardiovascular;  Laterality: N/A;  ? CARDIOVERSION N/A 06/27/2015  ? Procedure: CARDIOVERSION;  Surgeon: Thayer Headings, MD;  Location: [facility];  Service: Cardiovascular;  Laterality: N/A;  ? CARDIOVERSION N/A 07/04/2015  ? Procedure: CARDIOVERSION;  Surgeon: Evans Lance, MD;  Location: [place];  Service: Cardiovascular;  Laterality: N/A;  ? CARDIOVERSION N/A 04/13/2017  ? Procedure: CARDIOVERSION;  Surgeon: Sueanne Margarita, MD;  Location: [facility] ENDOSCOPY;  Service: Cardiovascular;  Laterality: N/A;  ? COLONOSCOPY    ? CORONARY STENT INTERVENTION N/A 10/15/2019  ? Procedure: CORONARY STENT INTERVENTION;  Surgeon: Jettie Booze, MD;  Location: [place] CV LAB;  Service: Cardiovascular;  Laterality: N/A;  ? EP IMPLANTABLE DEVICE N/A 06/16/2015  ? Procedure: Pacemaker Implant;  Surgeon: Evans Lance, MD;  Location: [place] CV LAB;  Service: Cardiovascular;  Laterality: N/A;  ? hemrrhoidectomy    ? LEFT AND RIGHT HEART CATHETERIZATION WITH CORONARY ANGIOGRAM Bilateral 02/01/2011  ? Procedure: LEFT AND RIGHT HEART CATHETERIZATION WITH CORONARY ANGIOGRAM;  Surgeon: Hillary Bow, MD;  Location: [facility] CATH LAB;  Service: Cardiovascular;  Laterality: Bilateral;  ? LEFT HEART CATH AND  CORONARY ANGIOGRAPHY N/A 10/15/2019  ? Procedure: LEFT HEART CATH AND CORONARY ANGIOGRAPHY;  Surgeon: Jettie Booze, MD;  Location: [place] CV LAB;  Service: Cardiovascular;  Laterality: N/A;  ? LEFT HEART CATH AND CORONARY ANGIOGRAPHY N/A 10/07/2020  ? Procedure: LEFT HEART CATH AND CORONARY ANGIOGRAPHY;  Surgeon: Sherren Mocha, MD;  Location: [place] CV LAB;  Service: Cardiovascular;  Laterality: N/A;  ? LUMBAR LAMINECTOMY/DECOMPRESSION MICRODISCECTOMY Right 02/23/2021  ? Procedure: Right Lumbar four-five  microdiscectomy;  Surgeon: Eustace Moore, MD;  Location: [facility];  Service: Neurosurgery;  Laterality: Right;  ? MAZE  03/15/2011  ? Procedure: MAZE;  Surgeon: Gaye Pollack, MD;  Location: [place];  Service: Open Heart Surgery;  Laterality: N/A;  ? PACEMAKER INSERTION  1991  ? Guidant PPM, most recent Generator Change by Dr Olevia Perches was 08/22/06  ? RIGHT/LEFT HEART CATH AND CORONARY ANGIOGRAPHY N/A 07/06/2016  ? Procedure: Right/Left Heart Cath and Coronary Angiography;  Surgeon: Sherren Mocha, MD;  Location: [place] CV LAB;  Service: Cardiovascular;  Laterality: N/A;  ? TEE WITHOUT CARDIOVERSION  04/15/2011  ? Procedure: TRANSESOPHAGEAL ECHOCARDIOGRAM (TEE);  Surgeon: Loralie Champagne, MD;  Location: [facility];  Service: Cardiovascular;  Laterality: N/A;  ? TEE WITHOUT CARDIOVERSION N/A 09/11/2014  ? Procedure: TRANSESOPHAGEAL ECHOCARDIOGRAM (TEE);  Surgeon: Sueanne Margarita, MD;  Location: [place];  Service: Cardiovascular;  Laterality: N/A;  ? ? ?There were no vitals filed for this visit. ? ? ? Subjective Assessment - 05/18/21 1448   ? ? Subjective The patient reports he had a retinal stroke in the R eye and felt a "curtain" over the eye on 04/12/21.  He is finishing episode with Lyndee Hensen, PT due to back procedure.  This episode is related to recent stroke and general dizziness.   ? Pertinent History myasthenia gravis (vertical diplopia), h/o stroke, h/o back procedure 02/23/21,   ? Patient Stated Goals Address weakness and dizziness (he is finishing activity for LBP)   ? Currently in Pain? No/denies   ? ?  ?  ? ?  ? ? ? ? ? [facility] PT Assessment - 05/18/21 1453   ? ?  ? Assessment  ? Medical Diagnosis Vertigo   ? Referring Provider (PT) Sherley Bounds, MD   ? Onset Date/Surgical Date 04/12/21   ? Hand Dominance Right   ? Prior Therapy current patient for low back pain   ?  ? Precautions  ? Precautions None   ?  ? Restrictions  ? Weight Bearing Restrictions No   ?  ? Balance Screen  ? Has the patient fallen in the  past 6 months No   ? Has the patient had a decrease in activity level because of a fear of falling?  No   ? Is the patient reluctant to leave their home because of a fear of falling?  No   ?  ? Home Environment  ? Living Environment Private residence   ? Living Arrangements Spouse/significant other   ? Type of Home House   ? Home Access Level entry   ? Home Layout One level   ? [employer] - single point;Grab bars - tub/shower;Grab bars - toilet;Walker - 2 wheels   ?  ? Prior Function  ? Level of Independence Independent   ? 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 Leisure goes out int he community   ?  ? Observation/Other Assessments  ? Focus on Therapeutic Outcomes (FOTO)  n/a   ?  ?  Sensation  ? Light Touch Appears Intact   ? Additional Comments occasional numbness and tingling in lower extremities   ?  ? Balance  ? Balance Assessed --   Standing with eyes closed/eyes open; standing with head turns. *These bring on sensation of dizziness.  ? ?  ?  ? ?  ? ? ? ? ? ? ? ? ? Vestibular Assessment - 05/18/21 1456   ? ?  ? Vestibular Assessment  ? General Observation Patient walks into clinic independently   ?  ? Symptom Behavior  ? Subjective history of current problem Patient notes dizziness present every day.   ? Type of Dizziness  Diplopia;Imbalance;Unsteady with head/body turns;Vertigo   ? Frequency of Dizziness daily   ? Duration of Dizziness minutes   ? Symptom Nature Motion provoked;Positional;Spontaneous   ? Aggravating Factors Activity in general   ? Relieving Factors Head stationary;Closing eyes   ? Progression of Symptoms Worse   ?  ? Oculomotor Exam  ? Oculomotor Alignment Abnormal   uses head tilt  ?  ? Vestibulo-Ocular Reflex  ? Comment slow VOR does not double, does not get increased dizziness today   ?  ? Positional Testing  ? Dix-Hallpike Dix-Hallpike Right;Dix-Hallpike Left   ? Sidelying Test Sidelying Right;Sidelying Left   ? Horizontal Canal Testing Horizontal Canal  Right;Horizontal Canal Left   ?  ? Dix-Hallpike Right  ? Dix-Hallpike Right Duration none   ? Dix-Hallpike Right Symptoms No nystagmus   ?  ? Dix-Hallpike Left  ? Dix-Hallpike Left Duration none   ? Dix-Hallpike Left Sym

## 2021-05-19 ENCOUNTER — Encounter: Payer: Medicare Other | Admitting: Physical Therapy

## 2021-05-20 ENCOUNTER — Encounter: Payer: Medicare Other | Admitting: Internal Medicine

## 2021-06-09 ENCOUNTER — Other Ambulatory Visit (HOSPITAL_BASED_OUTPATIENT_CLINIC_OR_DEPARTMENT_OTHER): Payer: Self-pay

## 2021-06-09 ENCOUNTER — Other Ambulatory Visit: Payer: Self-pay | Admitting: Internal Medicine

## 2021-06-09 MED ORDER — HYDROCODONE-ACETAMINOPHEN 5-325 MG PO TABS
ORAL_TABLET | ORAL | 0 refills | Status: DC
  Filled 2021-06-09: qty 120, 30d supply, fill #0

## 2021-06-09 NOTE — Telephone Encounter (Signed)
Confirmed on medication list: HYDROcodone-acetaminophen (NORCO) 5-325 MG tablet  ? ?Take One and a Half tablet by mouth twice daily and Take One tablet at bedtime for pain.  ? ?Last filled 05/04/21. Last visit 2.20.23. Next visit 5.3.23 ?

## 2021-06-11 ENCOUNTER — Other Ambulatory Visit (HOSPITAL_BASED_OUTPATIENT_CLINIC_OR_DEPARTMENT_OTHER): Payer: Self-pay

## 2021-06-15 ENCOUNTER — Other Ambulatory Visit (HOSPITAL_BASED_OUTPATIENT_CLINIC_OR_DEPARTMENT_OTHER): Payer: Self-pay

## 2021-06-18 ENCOUNTER — Ambulatory Visit (INDEPENDENT_AMBULATORY_CARE_PROVIDER_SITE_OTHER): Payer: Medicare Other | Admitting: Neurology

## 2021-06-18 ENCOUNTER — Other Ambulatory Visit (HOSPITAL_BASED_OUTPATIENT_CLINIC_OR_DEPARTMENT_OTHER): Payer: Self-pay

## 2021-06-18 ENCOUNTER — Encounter: Payer: Self-pay | Admitting: Neurology

## 2021-06-18 VITALS — BP 184/91 | HR 66 | Ht 73.0 in | Wt 225.0 lb

## 2021-06-18 DIAGNOSIS — H34231 Retinal artery branch occlusion, right eye: Secondary | ICD-10-CM | POA: Diagnosis not present

## 2021-06-18 DIAGNOSIS — I25119 Atherosclerotic heart disease of native coronary artery with unspecified angina pectoris: Secondary | ICD-10-CM

## 2021-06-18 NOTE — Patient Instructions (Signed)
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  I recommend he continue Eliquis as there is no definite data about superiority of switching to alternative anticoagulants.  Continue Pletal 100 mg twice daily as recent data suggest benefit in small vessel disease without increasing bleeding risk.  Maintain aggressive risk factor modification with strict control of hypertension blood pressure goal below 130/90, lipids with LDL cholesterol goal below 70 mg percent and diabetes with hemoglobin A1c goal below 6.5%.  He will return for follow-up in the future only as necessary. ?

## 2021-06-18 NOTE — Progress Notes (Signed)
?[facility] ?[address] ?[place]. [place] [address] ?[phone] ? ?     OFFICE FOLLOW-UP NOTE ? 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  History is obtained from the patient and nurse came was accompanying him as well as review of electronic medical records and opossum reviewed pertinent available imaging films in PACS.  He has a past medical history of aortic valve replacement, benign prostatic hypertrophy, chronic systolic heart failure, pacemaker x2, CAD s/p stents, hypertension, hyperlipidemia, prediabetes, hypothyroidism, MUGS,, ocular myasthenia, chronic atrial fibrillation on long-term Eliquis and remote stroke in 1990.  He woke up on 04/12/2021 with sudden onset of vision disturbance which he describes as a blue curtain coming from the top of his right eye but with a horizontal descent with stopping in the midportion.  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  CT angiogram of the head and neck showed no large vessel stenosis or occlusion but suggested orbital venous varix which was confirmed with a dedicated orbital CT.  The patient however denied symptoms in the form of eye pain, proptosis and diplopia.  Echocardiogram showed normal ejection fraction with moderate left ventricular hypertrophy and some inferior basal hypokinesia.  Prosthetic aortic valve.  LDL cholesterol 64 mg percent.  Hemoglobin A1c was 6.5.  MRI could not be done since patient had a pacemaker.  Patient was already on Eliquis and had been quite compliant with taking it.  Patient was asked to continue it as there was no definitive data suggesting switching Eliquis to alternative anticoagulant may be better.  Also I felt the retinal artery branch occlusion may be due to small vessel  disease of the eye and due to recent results from LACE 2 study decided to add Pletal 100 mg twice daily which has shown benefit in small vessel disease has not shown any increase bleeding risk.  Patient states is done well since discharge has not had any bleeding or bruising.  He is tolerating Pletal well without side effects.  He still has significant vision loss in the right eye in the upper portion but EKG is beginning to see some shadows the objects and not clear in the upper portion of the right eye.  He continues to have some intermittent dizziness and balance issues which sound mostly orthostatic.  He has not had any falls or injuries. ? ?ROS:   ?14 system review of Systems is positive for right eye vision loss, imbalance, tiredness, walking difficulty bruising all other systems negative ? ?PMH:  ?Past Medical History:  ?Diagnosis Date  ? Aortic stenosis   ? moderate aortic stenosis  ? Arthritis   ? Benign prostatic hypertrophy   ? Chronic systolic heart failure ([place])   ? Chronotropic incompetence with sinus node dysfunction (HCC)   ? Status post Guidant dual-mode, dual-pacing, dual-sensing  pacemaker   implantation now programmed to AAI with recent generator change.  ? Coronary artery disease   ? status post multiple prior percutaneous coronary interventions, microvascular angina per Dr Olevia Perches  ? Diverticulitis sigmoid colon recurrent   ? Dysfunctional autonomic nervous system   ? Dyspnea   ? Heart murmur   ? History of primary hypertension   ? Hypercoagulable state ([place])   ? chronically anticoagulated with coumadin  ? Hyperlipidemia   ?  Hyperthyroidism   ? Hypothyroidism   ? Dr. Elyse Hsu  ? MGUS (monoclonal gammopathy of unknown significance) 02/17/2013  ? 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 Osteoarthritis   ? Paroxysmal atrial fibrillation (HCC)   ? DR. Lia Foyer,   ? Prediabetes 09/21/2017  ? Stroke [facility])   ? 1990  ? ? ?Social History:  ?Social History  ? ?Socioeconomic History  ? Marital status: Married  ?   Spouse name: Baker Janus   ? Number of children: 3  ? Years of education: Not on file  ? Highest education level: Not on file  ?Occupational History  ? 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 Smoking status: Never  ? Smokeless tobacco: Never  ?Vaping Use  ? Vaping Use: Never used  ?Substance and Sexual Activity  ? Alcohol use: No  ?  Alcohol/week: 0.0 standard drinks  ? Drug use: No  ? Sexual activity: Not Currently  ?Other Topics Concern  ? Not on file  ?Social History Narrative  ? Married to [place]. Has grown children  ? Retired Horticulturist, commercial MD  ?   ? Never smoker no alcohol  ?   ? ?Social Determinants of Health  ? ?Financial Resource Strain: Not on file  ?Food Insecurity: Not on file  ?Transportation Needs: Not on file  ?Physical Activity: Not on file  ?Stress: Not on file  ?Social Connections: Not on file  ?Intimate Partner Violence: Not on file  ? ? ?Medications:   ?Current Outpatient Medications on File Prior to Visit  ?Medication Sig Dispense Refill  ? amLODipine (NORVASC) 2.5 MG tablet Take 1 tablet (2.5 mg total) by mouth at bedtime. 90 tablet 3  ? apixaban (ELIQUIS) 5 MG TABS tablet TAKE 1 TABLET BY MOUTH 2 TIMES DAILY. (Patient taking differently: Take 5 mg by mouth 2 (two) times daily.) 180 tablet 2  ? butalbital-acetaminophen-caffeine (FIORICET) 50-325-40 MG tablet Take 1 tablet by mouth twice daily as needed for headache. 14 tablet 5  ? Cholecalciferol (VITAMIN D3) 2000 units capsule Take 2,000 Units by mouth daily.    ? cilostazol (PLETAL) 100 MG tablet Take 1 tablet (100 mg total) by mouth 2 (two) times daily. 180 tablet 3  ? Coenzyme Q10 200 MG TABS Take 200 mg by mouth daily.    ? desoximetasone (TOPICORT) 0.25 % cream Apply 1 application topically 2 (two) times daily. (Patient taking differently: Apply 1 application. topically 2 (two) times daily as needed (rash or irritation).) 60 g 0  ? diclofenac Sodium (VOLTAREN) 1 % GEL Apply 2 g topically 2 (two) times daily as needed (knee pain).     ? digoxin (LANOXIN) 0.125 MG tablet Take 1 tablet (0.125 mg total) by mouth daily. 90 tablet 3  ? diltiazem (CARDIZEM) 30 MG tablet Take 30 mg by mouth daily as needed (sbp <130).    ? docusate sodium (COLACE) 100 MG capsule Take 100 mg by mouth 2 (two) times daily.    ? doxazosin (CARDURA) 4 MG tablet Take 1 tablet by mouth daily (Patient taking differently: Take 4 mg by mouth at bedtime.) 90 tablet 3  ? ezetimibe (ZETIA) 10 MG tablet Take 1 tablet (10 mg total) by mouth daily. (Patient taking differently: Take 10 mg by mouth at bedtime.) 90 tablet 3  ? HYDROcodone-acetaminophen (NORCO) 5-325 MG tablet Take One and a Half tablet by mouth twice daily and Take One tablet at bedtime for pain. 120 tablet 0  ? LORazepam (ATIVAN) 1 MG tablet TAKE 1  TABLET BY MOUTH AT BEDTIME. MAY ALSO TAKE 1/2 TABLET DAILY AS NEEDED FOR ANXIETY. (Patient taking differently: Take 1 mg by mouth at bedtime.) 45 tablet 5  ? magnesium oxide (MAG-OX) 400 MG tablet Take 400 mg by mouth daily.    ? metoprolol succinate (TOPROL-XL) 50 MG 24 hr tablet Take 1/2 tablet (25 mg) in the AM and 1 tablet (50 mg) in the PM. (Patient taking differently: Take 50 mg by mouth 2 (two) times daily.) 145 tablet 3  ? mometasone (ELOCON) 0.1 % cream Apply 1 application topically daily. (Patient taking differently: Apply 1 application. topically daily as needed (rash or irritation).) 15 g 0  ? Multiple Vitamins-Minerals (MULTIVITAMIN WITH MINERALS) tablet Take 1 tablet by mouth daily.    ? nitroGLYCERIN (NITROSTAT) 0.4 MG SL tablet PLACE 1 TABLET UNDER THE TONGUE EVERY 5 MINUTES AS NEEDED FOR CHEST PAIN. (Patient taking differently: Place 0.4 mg under the tongue every 5 (five) minutes as needed for chest pain.) 25 tablet 5  ? pantoprazole (PROTONIX) 40 MG tablet TAKE 1 TABLET BY MOUTH DAILY. (Patient taking differently: Take 40 mg by mouth daily.) 90 tablet 3  ? polyethylene glycol (MIRALAX / GLYCOLAX) 17 g packet Take 17 g by mouth daily.    ? potassium chloride  SA (KLOR-CON M) 20 MEQ tablet TAKE 1 TABLET BY MOUTH DAILY. (Patient taking differently: Take 20 mEq by mouth daily.) 90 tablet 2  ? predniSONE (DELTASONE) 10 MG tablet Take 1 tablet (10 mg total) by mouth d

## 2021-06-22 ENCOUNTER — Other Ambulatory Visit (HOSPITAL_BASED_OUTPATIENT_CLINIC_OR_DEPARTMENT_OTHER): Payer: Self-pay

## 2021-06-23 ENCOUNTER — Other Ambulatory Visit (HOSPITAL_BASED_OUTPATIENT_CLINIC_OR_DEPARTMENT_OTHER): Payer: Self-pay

## 2021-06-30 ENCOUNTER — Other Ambulatory Visit: Payer: Self-pay | Admitting: Internal Medicine

## 2021-06-30 ENCOUNTER — Other Ambulatory Visit (HOSPITAL_BASED_OUTPATIENT_CLINIC_OR_DEPARTMENT_OTHER): Payer: Self-pay

## 2021-07-01 ENCOUNTER — Non-Acute Institutional Stay: Payer: Medicare Other | Admitting: Internal Medicine

## 2021-07-01 ENCOUNTER — Encounter: Payer: Self-pay | Admitting: Internal Medicine

## 2021-07-01 ENCOUNTER — Other Ambulatory Visit (HOSPITAL_BASED_OUTPATIENT_CLINIC_OR_DEPARTMENT_OTHER): Payer: Self-pay

## 2021-07-01 VITALS — BP 114/76 | HR 77 | Temp 97.7°F | Ht 73.0 in

## 2021-07-01 DIAGNOSIS — R7303 Prediabetes: Secondary | ICD-10-CM | POA: Diagnosis not present

## 2021-07-01 DIAGNOSIS — D649 Anemia, unspecified: Secondary | ICD-10-CM

## 2021-07-01 MED ORDER — EZETIMIBE 10 MG PO TABS
10.0000 mg | ORAL_TABLET | Freq: Every day | ORAL | 3 refills | Status: DC
  Filled 2021-07-01: qty 90, 90d supply, fill #0
  Filled 2021-10-14: qty 90, 90d supply, fill #1
  Filled 2022-01-12: qty 90, 90d supply, fill #2
  Filled 2022-04-07: qty 90, 90d supply, fill #3

## 2021-07-01 NOTE — Progress Notes (Signed)
? ?Location:  [facility] ?  ?Place of Service:  Clinic (12) ? ?Provider:  ? ?Code Status:  ?Goals of Care:  ? ?  04/20/2021  ?  9:24 AM  ?Advanced Directives  ?Does Patient Have a Medical Advance Directive? Yes  ?Type of Advance Directive Living will  ?Does patient want to make changes to medical advance directive? No - Guardian declined  ? ? ? ?Chief Complaint  ?Patient presents with  ? Medical Management of Chronic Issues  ?  Patient returns to the clinic for 6 month follow up. YW  ? Quality Metric Gaps  ?  TDAP, Shingrix, PCV and Covid #4 are due.   ? ? ?HPI: Patient is a 87 y.o. male seen today for medical management of chronic diseases.   ? ?Has h/o CAD, s/p Stent Distal RCA Stent Cath in 09/2019 ?Repeat Cath Done in 08/22 for Anginal Symptoms. Stents were open ? Follows very Closely with Cardiology ? H/o Atrial Flutter on Eliquis Due to high runs of A Flutter Digoxin was added  ?Sinus Node Dysfunction s/p PPM ?Orthostatic Hypotension ?Severe OsteoArthritis on Norco Prn  and Prednisone PRN ? Adrenal Insufficiency per Dr Renne Crigler due to Prolong Steroid use for Osteoarthritis ? Takes Prednisone 10 mg QD ?MGUS Follows with Dr Benay Spice for  Labs ?BPH  ?Chronic Fatigue and Weakness ?Underwent R L4-5 hemilaminectomy with diskectomy on 12/26 for severe back pain radiating to right lower extremity ?Chronic Dizziness ? ?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 ?carotid ultrasound which did not show any significant stenosis ?2D echo which showed a EF of 60% with inferior wall hypokinesis ? ?Has stopped Digoxin recently due to Gynecomastia ?Continues to deal with dizziness and Pain due to arthritis ? ?Back pain is now Completly gone since surgery. Finished his therapy ?Vision issues continue due to RA occlusion ? ?Unable to function without his Norco. ?Now has Nurse to help with Meds ?Staying independent with his ADLS ?Walking with no falls or assist ?Holds furniture if needed ? ? ?Past Medical History:   ?Diagnosis Date  ? Aortic stenosis   ? moderate aortic stenosis  ? Arthritis   ? Benign prostatic hypertrophy   ? Chronic systolic heart failure ([place])   ? Chronotropic incompetence with sinus node dysfunction (HCC)   ? Status post Guidant dual-mode, dual-pacing, dual-sensing  pacemaker   implantation now programmed to AAI with recent generator change.  ? Coronary artery disease   ? status post multiple prior percutaneous coronary interventions, microvascular angina per Dr Olevia Perches  ? Diverticulitis sigmoid colon recurrent   ? Dysfunctional autonomic nervous system   ? Dyspnea   ? Heart murmur   ? History of primary hypertension   ? Hypercoagulable state ([place])   ? chronically anticoagulated with coumadin  ? Hyperlipidemia   ? Hyperthyroidism   ? Hypothyroidism   ? Dr. Elyse Hsu  ? MGUS (monoclonal gammopathy of unknown significance) 02/17/2013  ? 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 Osteoarthritis   ? Paroxysmal atrial fibrillation (HCC)   ? DR. Lia Foyer,   ? Prediabetes 09/21/2017  ? Stroke [facility])   ? 1990  ? ? ?Past Surgical History:  ?Procedure Laterality Date  ? AORTIC VALVE REPLACEMENT  03/15/2011  ? Procedure: AORTIC VALVE REPLACEMENT (AVR);  Surgeon: Gaye Pollack, MD;  Location: [place];  Service: Open Heart Surgery;  Laterality: N/A;  ? APPENDECTOMY    ? CARDIAC CATHETERIZATION    ? 11  ? CARDIOVERSION    ? CARDIOVERSION  04/15/2011  ?  Procedure: CARDIOVERSION;  Surgeon: Loralie Champagne, MD;  Location: [place];  Service: Cardiovascular;  Laterality: N/A;  ? CARDIOVERSION N/A 09/11/2014  ? Procedure: CARDIOVERSION;  Surgeon: Sueanne Margarita, MD;  Location: [place];  Service: Cardiovascular;  Laterality: N/A;  ? CARDIOVERSION N/A 06/27/2015  ? Procedure: CARDIOVERSION;  Surgeon: Thayer Headings, MD;  Location: [place];  Service: Cardiovascular;  Laterality: N/A;  ? CARDIOVERSION N/A 07/04/2015  ? Procedure: CARDIOVERSION;  Surgeon: Evans Lance, MD;  Location: [place];  Service: Cardiovascular;  Laterality:  N/A;  ? CARDIOVERSION N/A 04/13/2017  ? Procedure: CARDIOVERSION;  Surgeon: Sueanne Margarita, MD;  Location: [facility] ENDOSCOPY;  Service: Cardiovascular;  Laterality: N/A;  ? COLONOSCOPY    ? CORONARY STENT INTERVENTION N/A 10/15/2019  ? Procedure: CORONARY STENT INTERVENTION;  Surgeon: Jettie Booze, MD;  Location: [place] CV LAB;  Service: Cardiovascular;  Laterality: N/A;  ? EP IMPLANTABLE DEVICE N/A 06/16/2015  ? Procedure: Pacemaker Implant;  Surgeon: Evans Lance, MD;  Location: [place] CV LAB;  Service: Cardiovascular;  Laterality: N/A;  ? hemrrhoidectomy    ? LEFT AND RIGHT HEART CATHETERIZATION WITH CORONARY ANGIOGRAM Bilateral 02/01/2011  ? Procedure: LEFT AND RIGHT HEART CATHETERIZATION WITH CORONARY ANGIOGRAM;  Surgeon: Hillary Bow, MD;  Location: [facility] CATH LAB;  Service: Cardiovascular;  Laterality: Bilateral;  ? LEFT HEART CATH AND CORONARY ANGIOGRAPHY N/A 10/15/2019  ? Procedure: LEFT HEART CATH AND CORONARY ANGIOGRAPHY;  Surgeon: Jettie Booze, MD;  Location: [place] CV LAB;  Service: Cardiovascular;  Laterality: N/A;  ? LEFT HEART CATH AND CORONARY ANGIOGRAPHY N/A 10/07/2020  ? Procedure: LEFT HEART CATH AND CORONARY ANGIOGRAPHY;  Surgeon: Sherren Mocha, MD;  Location: [place] CV LAB;  Service: Cardiovascular;  Laterality: N/A;  ? LUMBAR LAMINECTOMY/DECOMPRESSION MICRODISCECTOMY Right 02/23/2021  ? Procedure: Right Lumbar four-five microdiscectomy;  Surgeon: Eustace Moore, MD;  Location: [place];  Service: Neurosurgery;  Laterality: Right;  ? MAZE  03/15/2011  ? Procedure: MAZE;  Surgeon: Gaye Pollack, MD;  Location: [place];  Service: Open Heart Surgery;  Laterality: N/A;  ? PACEMAKER INSERTION  1991  ? Guidant PPM, most recent Generator Change by Dr Olevia Perches was 08/22/06  ? RIGHT/LEFT HEART CATH AND CORONARY ANGIOGRAPHY N/A 07/06/2016  ? Procedure: Right/Left Heart Cath and Coronary Angiography;  Surgeon: Sherren Mocha, MD;  Location: [place] CV LAB;  Service:  Cardiovascular;  Laterality: N/A;  ? TEE WITHOUT CARDIOVERSION  04/15/2011  ? Procedure: TRANSESOPHAGEAL ECHOCARDIOGRAM (TEE);  Surgeon: Loralie Champagne, MD;  Location: [place];  Service: Cardiovascular;  Laterality: N/A;  ? TEE WITHOUT CARDIOVERSION N/A 09/11/2014  ? Procedure: TRANSESOPHAGEAL ECHOCARDIOGRAM (TEE);  Surgeon: Sueanne Margarita, MD;  Location: [place];  Service: Cardiovascular;  Laterality: N/A;  ? ? ?Allergies  ?Allergen Reactions  ? Contrast Media [Iodinated Contrast Media] Hives  ? Gadolinium Derivatives Hives  ? Metrizamide Hives  ? ? ?Outpatient Encounter Medications as of 07/01/2021  ?Medication Sig  ? amLODipine (NORVASC) 2.5 MG tablet Take 1 tablet (2.5 mg total) by mouth at bedtime.  ? apixaban (ELIQUIS) 5 MG TABS tablet TAKE 1 TABLET BY MOUTH 2 TIMES DAILY. (Patient taking differently: Take 5 mg by mouth 2 (two) times daily.)  ? butalbital-acetaminophen-caffeine (FIORICET) 50-325-40 MG tablet Take 1 tablet by mouth twice daily as needed for headache.  ? Cholecalciferol (VITAMIN D3) 2000 units capsule Take 2,000 Units by mouth daily.  ? cilostazol (PLETAL) 100 MG tablet Take 1 tablet (100 mg total) by mouth  2 (two) times daily.  ? Coenzyme Q10 200 MG TABS Take 200 mg by mouth daily.  ? desoximetasone (TOPICORT) 0.25 % cream Apply 1 application topically 2 (two) times daily. (Patient taking differently: Apply 1 application. topically 2 (two) times daily as needed (rash or irritation).)  ? diclofenac Sodium (VOLTAREN) 1 % GEL Apply 2 g topically 2 (two) times daily as needed (knee pain).  ? digoxin (LANOXIN) 0.125 MG tablet Take 1 tablet (0.125 mg total) by mouth daily. (Patient taking differently: Take 0.125 mg by mouth daily. Not taking it right now due to Gynecomastia)  ? diltiazem (CARDIZEM) 30 MG tablet Take 30 mg by mouth daily as needed (sbp <130).  ? docusate sodium (COLACE) 100 MG capsule Take 100 mg by mouth 2 (two) times daily.  ? doxazosin (CARDURA) 4 MG tablet Take 1 tablet by  mouth daily (Patient taking differently: Take 4 mg by mouth at bedtime.)  ? ezetimibe (ZETIA) 10 MG tablet Take 1 tablet (10 mg total) by mouth daily.  ? HYDROcodone-acetaminophen (NORCO) 5-325 MG tablet Take One and a H

## 2021-07-09 ENCOUNTER — Other Ambulatory Visit (HOSPITAL_BASED_OUTPATIENT_CLINIC_OR_DEPARTMENT_OTHER): Payer: Self-pay

## 2021-07-09 ENCOUNTER — Other Ambulatory Visit: Payer: Self-pay | Admitting: Internal Medicine

## 2021-07-09 MED ORDER — HYDROCODONE-ACETAMINOPHEN 5-325 MG PO TABS
ORAL_TABLET | ORAL | 0 refills | Status: DC
  Filled 2021-07-09: qty 120, 30d supply, fill #0

## 2021-07-09 NOTE — Telephone Encounter (Signed)
Patient has request refill on medication Hydrocodone 3-'325mg'$ . Patient last refill dated 06/09/2021. Patient has Opioid Contract with Rheumatology. Medication pend and sent to Royal Hawthorn, NP.  ?

## 2021-07-14 ENCOUNTER — Other Ambulatory Visit (HOSPITAL_BASED_OUTPATIENT_CLINIC_OR_DEPARTMENT_OTHER): Payer: Self-pay

## 2021-07-16 ENCOUNTER — Other Ambulatory Visit (HOSPITAL_BASED_OUTPATIENT_CLINIC_OR_DEPARTMENT_OTHER): Payer: Self-pay

## 2021-07-17 ENCOUNTER — Other Ambulatory Visit (HOSPITAL_BASED_OUTPATIENT_CLINIC_OR_DEPARTMENT_OTHER): Payer: Self-pay

## 2021-07-29 ENCOUNTER — Encounter: Payer: Medicare Other | Admitting: Internal Medicine

## 2021-07-29 ENCOUNTER — Other Ambulatory Visit (HOSPITAL_BASED_OUTPATIENT_CLINIC_OR_DEPARTMENT_OTHER): Payer: Self-pay

## 2021-07-29 DIAGNOSIS — M19011 Primary osteoarthritis, right shoulder: Secondary | ICD-10-CM | POA: Diagnosis not present

## 2021-07-29 DIAGNOSIS — M19012 Primary osteoarthritis, left shoulder: Secondary | ICD-10-CM | POA: Diagnosis not present

## 2021-07-30 ENCOUNTER — Other Ambulatory Visit (HOSPITAL_COMMUNITY): Payer: Self-pay

## 2021-07-30 ENCOUNTER — Other Ambulatory Visit (HOSPITAL_BASED_OUTPATIENT_CLINIC_OR_DEPARTMENT_OTHER): Payer: Self-pay

## 2021-07-30 DIAGNOSIS — C44311 Basal cell carcinoma of skin of nose: Secondary | ICD-10-CM | POA: Diagnosis not present

## 2021-07-30 DIAGNOSIS — L570 Actinic keratosis: Secondary | ICD-10-CM | POA: Diagnosis not present

## 2021-07-30 DIAGNOSIS — L821 Other seborrheic keratosis: Secondary | ICD-10-CM | POA: Diagnosis not present

## 2021-07-30 DIAGNOSIS — D034 Melanoma in situ of scalp and neck: Secondary | ICD-10-CM | POA: Diagnosis not present

## 2021-07-30 DIAGNOSIS — L578 Other skin changes due to chronic exposure to nonionizing radiation: Secondary | ICD-10-CM | POA: Diagnosis not present

## 2021-07-30 DIAGNOSIS — D225 Melanocytic nevi of trunk: Secondary | ICD-10-CM | POA: Diagnosis not present

## 2021-07-30 DIAGNOSIS — L219 Seborrheic dermatitis, unspecified: Secondary | ICD-10-CM | POA: Diagnosis not present

## 2021-07-30 DIAGNOSIS — D485 Neoplasm of uncertain behavior of skin: Secondary | ICD-10-CM | POA: Diagnosis not present

## 2021-07-30 DIAGNOSIS — D0439 Carcinoma in situ of skin of other parts of face: Secondary | ICD-10-CM | POA: Diagnosis not present

## 2021-07-30 MED ORDER — MOMETASONE FUROATE 0.1 % EX CREA
TOPICAL_CREAM | CUTANEOUS | 1 refills | Status: DC
  Filled 2021-07-30: qty 45, 14d supply, fill #0
  Filled 2021-08-10: qty 45, 14d supply, fill #1

## 2021-07-30 MED ORDER — FLUOROURACIL 5 % EX CREA
TOPICAL_CREAM | CUTANEOUS | 0 refills | Status: DC
  Filled 2021-07-30: qty 40, 30d supply, fill #0

## 2021-07-30 MED ORDER — AMOXICILLIN 500 MG PO CAPS
ORAL_CAPSULE | ORAL | 99 refills | Status: DC
  Filled 2021-07-30 (×2): qty 12, 3d supply, fill #0
  Filled 2021-08-04: qty 12, 3d supply, fill #1
  Filled 2021-08-06: qty 12, 3d supply, fill #2

## 2021-08-04 ENCOUNTER — Other Ambulatory Visit (HOSPITAL_BASED_OUTPATIENT_CLINIC_OR_DEPARTMENT_OTHER): Payer: Self-pay

## 2021-08-05 ENCOUNTER — Telehealth: Payer: Self-pay | Admitting: Internal Medicine

## 2021-08-05 ENCOUNTER — Other Ambulatory Visit (HOSPITAL_BASED_OUTPATIENT_CLINIC_OR_DEPARTMENT_OTHER): Payer: Self-pay

## 2021-08-05 NOTE — Telephone Encounter (Signed)
Kaira, please offer Dr. Bernita Buffy one of my banding spots this week or next vs Dr. Celesta Aver banding spot on June 20.

## 2021-08-05 NOTE — Telephone Encounter (Signed)
Patient has been scheduled for follow up with Dr. Fuller Plan tomorrow 08/06/21 at 11:10 am. Patient is aware.

## 2021-08-05 NOTE — Telephone Encounter (Signed)
  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  He is calling because of a several month history of epigastric pain and belching and hiccups.  He says he has been using PPIs.  He does not think he is lost weight but he is having some mild early satiety.  Asking for an office evaluation.   I do not have availability for the next week and a half in the office.  Dr. Fuller Plan has kindly agreed to see the patient with availability and it looks like he has a couple of openings in his hemorrhoidal banding spots tomorrow and next week.  If that will not work I could see the patient in my banding spot on June 20.  Please contact the patient and schedule an appointment

## 2021-08-06 ENCOUNTER — Ambulatory Visit (INDEPENDENT_AMBULATORY_CARE_PROVIDER_SITE_OTHER): Payer: Medicare Other | Admitting: Gastroenterology

## 2021-08-06 ENCOUNTER — Encounter: Payer: Self-pay | Admitting: Gastroenterology

## 2021-08-06 ENCOUNTER — Other Ambulatory Visit (INDEPENDENT_AMBULATORY_CARE_PROVIDER_SITE_OTHER): Payer: Medicare Other

## 2021-08-06 ENCOUNTER — Other Ambulatory Visit (HOSPITAL_BASED_OUTPATIENT_CLINIC_OR_DEPARTMENT_OTHER): Payer: Self-pay

## 2021-08-06 VITALS — BP 120/80 | HR 74 | Temp 97.6°F | Ht 73.0 in | Wt 220.2 lb

## 2021-08-06 DIAGNOSIS — R1030 Lower abdominal pain, unspecified: Secondary | ICD-10-CM

## 2021-08-06 DIAGNOSIS — I25119 Atherosclerotic heart disease of native coronary artery with unspecified angina pectoris: Secondary | ICD-10-CM | POA: Diagnosis not present

## 2021-08-06 LAB — CBC WITH DIFFERENTIAL/PLATELET
Basophils Absolute: 0 10*3/uL (ref 0.0–0.1)
Basophils Relative: 0.2 % (ref 0.0–3.0)
Eosinophils Absolute: 0.2 10*3/uL (ref 0.0–0.7)
Eosinophils Relative: 1.1 % (ref 0.0–5.0)
HCT: 35.6 % — ABNORMAL LOW (ref 39.0–52.0)
Hemoglobin: 11.6 g/dL — ABNORMAL LOW (ref 13.0–17.0)
Lymphocytes Relative: 8.8 % — ABNORMAL LOW (ref 12.0–46.0)
Lymphs Abs: 1.2 10*3/uL (ref 0.7–4.0)
MCHC: 32.6 g/dL (ref 30.0–36.0)
MCV: 83.8 fl (ref 78.0–100.0)
Monocytes Absolute: 0.8 10*3/uL (ref 0.1–1.0)
Monocytes Relative: 6.1 % (ref 3.0–12.0)
Neutro Abs: 11.2 10*3/uL — ABNORMAL HIGH (ref 1.4–7.7)
Neutrophils Relative %: 83.8 % — ABNORMAL HIGH (ref 43.0–77.0)
Platelets: 225 10*3/uL (ref 150.0–400.0)
RBC: 4.25 Mil/uL (ref 4.22–5.81)
RDW: 15.8 % — ABNORMAL HIGH (ref 11.5–15.5)
WBC: 13.4 10*3/uL — ABNORMAL HIGH (ref 4.0–10.5)

## 2021-08-06 LAB — COMPREHENSIVE METABOLIC PANEL
ALT: 26 U/L (ref 0–53)
AST: 21 U/L (ref 0–37)
Albumin: 4.1 g/dL (ref 3.5–5.2)
Alkaline Phosphatase: 88 U/L (ref 39–117)
BUN: 22 mg/dL (ref 6–23)
CO2: 31 mEq/L (ref 19–32)
Calcium: 9.6 mg/dL (ref 8.4–10.5)
Chloride: 100 mEq/L (ref 96–112)
Creatinine, Ser: 1.16 mg/dL (ref 0.40–1.50)
GFR: 57.67 mL/min — ABNORMAL LOW (ref >60.00)
Glucose, Bld: 130 mg/dL — ABNORMAL HIGH (ref 70–99)
Potassium: 4.3 mEq/L (ref 3.5–5.1)
Sodium: 140 mEq/L (ref 135–145)
Total Bilirubin: 0.6 mg/dL (ref 0.2–1.2)
Total Protein: 7.5 g/dL (ref 6.0–8.3)

## 2021-08-06 MED ORDER — PREDNISONE 50 MG PO TABS
ORAL_TABLET | ORAL | 0 refills | Status: DC
  Filled 2021-08-06: qty 3, 3d supply, fill #0

## 2021-08-06 MED ORDER — DIPHENHYDRAMINE HCL 50 MG PO TABS
50.0000 mg | ORAL_TABLET | ORAL | 0 refills | Status: DC
  Filled 2021-08-06 – 2021-08-11 (×4): qty 1, fill #0

## 2021-08-06 NOTE — Progress Notes (Signed)
    Assessment     Frequent mild lower abdominal pain.  Symptoms could be related to constipation. R/O diverticulitis and other disorders Intermittent epigastric pain, frequent hiccups. 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 Benay Spice   Recommendations    CBC, CMP, lipase today Schedule CT AP with premedication due to contrast allergy Continue pantoprazole 40 mg p.o. twice daily and follow antireflux measures Continue MiraLAX and Peri-Colace daily Obtaining blood work due for Dr. Gearldine Shown upcoming office visit per patient request Follow up with Dr. 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 Gene has had problems with recurrent diverticulitis.  He relates for the past several months he has had mild lower abdominal pain, right lower quadrant and left lower quadrant. His symptoms wax and wane intermittently.  No recent increase in symptoms.  He also has epigastric pain and hiccups following meals.  He increased his pantoprazole to twice daily from daily yesterday.  He takes MiraLAX and Peri-Colace regularly for management of constipation.  He relates no fevers, chills, or change in bowel habits.  He has a recent right retinal artery infarct while on Eliquis and Pletal was added to his regimen. Denies weight loss, diarrhea, change in stool caliber, melena, hematochezia, nausea, vomiting, dysphagia, chest pain.    Labs / Imaging       Latest Ref Rng & Units 04/12/2021    2:29 PM 08/26/2020   12:34 PM 01/11/2020    9:47 AM  Hepatic Function  Total Protein 6.5 - 8.1 g/dL 7.5  7.7  7.3   Albumin 3.5 - 5.0 g/dL 4.0  4.3  3.9   AST 15 - 41 U/L $Remo'26  22  26   'WVHOS$ ALT 0 - 44 U/L $Remo'19  19  20   'TPlpM$ Alk Phosphatase 38 - 126 U/L 82  93  101   Total Bilirubin 0.3 - 1.2 mg/dL 0.6  0.5  0.6        Latest Ref Rng & Units 04/13/2021    1:09  AM 04/12/2021    2:41 PM 04/12/2021    2:29 PM  CBC  WBC 4.0 - 10.5 K/uL 8.8   10.5   Hemoglobin 13.0 - 17.0 g/dL 10.3  11.9  11.2   Hematocrit 39.0 - 52.0 % 33.2  35.0  36.6   Platelets 150 - 400 K/uL 209   212     Current Medications, Allergies, Past Medical History, Past Surgical History, Family History and Social History were reviewed in [employer] record.   Physical Exam: General: Well developed, well nourished, no acute distress Head: Normocephalic and atraumatic Eyes: Sclerae anicteric, EOMI Ears: Normal auditory acuity Mouth: Not examined Lungs: Clear throughout to auscultation Heart: Irregularly, irregular rate and rhythm; 2/6 systolic murmur, no rubs or bruits Abdomen: Soft, minimal lower abdominal and epigastric tenderness and non distended. No masses, hepatosplenomegaly or hernias noted. Normal Bowel sounds Rectal: Not done Musculoskeletal: Symmetrical with no gross deformities  Pulses:  Normal pulses noted Extremities: No clubbing, cyanosis, edema or deformities noted Neurological: Alert oriented x 4, grossly nonfocal Psychological:  Alert and cooperative. Normal mood and affect   Karimah Winquist T. Fuller Plan, MD 08/06/2021, 11:09 AM

## 2021-08-06 NOTE — Patient Instructions (Signed)
Your provider has requested that you go to the basement level for lab work before leaving today. Press "B" on the elevator. The lab is located at the first door on the left as you exit the elevator.  Your records indicate that you have an allergy/sensitivity to one or more components within IV contrast dye. We have sent a prescription of Prednisone 50 mg (3 tablets) and Benadryl 50 mg (1 tablet) to your pharmacy as a pre-mediation for your contrasted procedure which should prevent any reaction from occurring.  Take (1) 50 mg tablet of prednisone 13 hours prior to your procedure at 4:00am.   Take (1) 50 mg tablet of prednisone 7 hours prior to your procedure at 10:00am.    Take (1) 50 mg tablet of prednisone and (1) 50 mg tablet of Benadryl 1 hour prior to your procedure at 4:00pm.     You have been scheduled for a CT scan of the abdomen and pelvis at [facility] ([place]).  You are scheduled on 08/11/21 at 5:00pm. You should arrive 30 minutes prior to your appointment time for registration. Please follow the written instructions below on the day of your exam:  WARNING: IF YOU ARE ALLERGIC TO IODINE/X-RAY DYE, PLEASE NOTIFY RADIOLOGY IMMEDIATELY AT [id number]! YOU WILL BE GIVEN A 13 HOUR PREMEDICATION PREP.  1) Do not eat or drink anything after 1:00pm (4 hours prior to your test) 2) You have been given 2 bottles of oral contrast to drink. The solution may taste better if refrigerated, but do NOT add ice or any other liquid to this solution. Shake well before drinking.    Drink 1 bottle of contrast @ 3:00pm (2 hours prior to your exam)  Drink 1 bottle of contrast @ 4:00pm (1 hour prior to your exam)  You may take any medications as prescribed with a small amount of water, if necessary. If you take any of the following medications: METFORMIN, GLUCOPHAGE, GLUCOVANCE, AVANDAMET, RIOMET, FORTAMET, [place] MET, JANUMET, GLUMETZA or METAGLIP, you MAY be asked to HOLD this  medication 48 hours AFTER the exam.  The purpose of you drinking the oral contrast is to aid in the visualization of your intestinal tract. The contrast solution may cause some diarrhea. Depending on your individual set of symptoms, you may also receive an intravenous injection of x-ray contrast/dye. Plan on being at [facility] for 30 minutes or longer, depending on the type of exam you are having performed.  This test typically takes 30-45 minutes to complete.  ___________________________________________________________

## 2021-08-07 ENCOUNTER — Other Ambulatory Visit: Payer: Self-pay | Admitting: Adult Health

## 2021-08-07 ENCOUNTER — Other Ambulatory Visit (HOSPITAL_BASED_OUTPATIENT_CLINIC_OR_DEPARTMENT_OTHER): Payer: Self-pay

## 2021-08-07 DIAGNOSIS — G7000 Myasthenia gravis without (acute) exacerbation: Secondary | ICD-10-CM | POA: Diagnosis not present

## 2021-08-07 DIAGNOSIS — H2512 Age-related nuclear cataract, left eye: Secondary | ICD-10-CM | POA: Diagnosis not present

## 2021-08-07 DIAGNOSIS — H34231 Retinal artery branch occlusion, right eye: Secondary | ICD-10-CM | POA: Diagnosis not present

## 2021-08-07 DIAGNOSIS — H2511 Age-related nuclear cataract, right eye: Secondary | ICD-10-CM | POA: Diagnosis not present

## 2021-08-07 MED ORDER — HYDROCODONE-ACETAMINOPHEN 5-325 MG PO TABS
ORAL_TABLET | ORAL | 0 refills | Status: DC
  Filled 2021-08-07 – 2021-08-11 (×2): qty 120, fill #0
  Filled 2021-08-12: qty 120, 30d supply, fill #0

## 2021-08-07 NOTE — Telephone Encounter (Signed)
Patient has request refill on medication Hydrocodone 5-'325mg'$ . Patient medication refilled 07/09/2021. Patient has Opioid Contract signed 09/08/2020. Patient has upcoming appointment 10/07/2021. Update Contract added to patient appointment note. Medication pend and sent to PCP [name], MD for approval.

## 2021-08-08 LAB — PROTEIN ELECTROPHORESIS, SERUM
Abnormal Protein Band1: 0.9 g/dL — ABNORMAL HIGH
Albumin ELP: 3.8 g/dL (ref 3.8–4.8)
Alpha 1: 0.3 g/dL (ref 0.2–0.3)
Alpha 2: 0.8 g/dL (ref 0.5–0.9)
Beta 2: 0.1 g/dL — ABNORMAL LOW (ref 0.2–0.5)
Beta Globulin: 0.5 g/dL (ref 0.4–0.6)
Gamma Globulin: 1.6 g/dL (ref 0.8–1.7)
Total Protein: 7.2 g/dL (ref 6.1–8.1)

## 2021-08-08 LAB — KAPPA/LAMBDA LIGHT CHAINS
Kappa free light chain: 23.5 mg/L — ABNORMAL HIGH (ref 3.3–19.4)
Kappa:Lambda Ratio: 0.59 (ref 0.26–1.65)
Lambda Free Lght Chn: 39.6 mg/L — ABNORMAL HIGH (ref 5.7–26.3)

## 2021-08-08 LAB — IGM: IgM, Serum: 1115 mg/dL — ABNORMAL HIGH (ref 50–300)

## 2021-08-10 ENCOUNTER — Other Ambulatory Visit (HOSPITAL_BASED_OUTPATIENT_CLINIC_OR_DEPARTMENT_OTHER): Payer: Self-pay

## 2021-08-11 ENCOUNTER — Other Ambulatory Visit (HOSPITAL_BASED_OUTPATIENT_CLINIC_OR_DEPARTMENT_OTHER): Payer: Self-pay

## 2021-08-11 ENCOUNTER — Ambulatory Visit (HOSPITAL_BASED_OUTPATIENT_CLINIC_OR_DEPARTMENT_OTHER)
Admission: RE | Admit: 2021-08-11 | Discharge: 2021-08-11 | Disposition: A | Discharge status: Home / Self Care | Payer: Medicare Other | Source: Ambulatory Visit | Attending: Gastroenterology | Admitting: Gastroenterology

## 2021-08-11 ENCOUNTER — Encounter (HOSPITAL_BASED_OUTPATIENT_CLINIC_OR_DEPARTMENT_OTHER): Payer: Self-pay

## 2021-08-11 DIAGNOSIS — N400 Enlarged prostate without lower urinary tract symptoms: Secondary | ICD-10-CM | POA: Diagnosis not present

## 2021-08-11 DIAGNOSIS — R1030 Lower abdominal pain, unspecified: Secondary | ICD-10-CM | POA: Diagnosis not present

## 2021-08-11 DIAGNOSIS — K8020 Calculus of gallbladder without cholecystitis without obstruction: Secondary | ICD-10-CM | POA: Diagnosis not present

## 2021-08-11 DIAGNOSIS — K5730 Diverticulosis of large intestine without perforation or abscess without bleeding: Secondary | ICD-10-CM | POA: Diagnosis not present

## 2021-08-11 DIAGNOSIS — N281 Cyst of kidney, acquired: Secondary | ICD-10-CM | POA: Diagnosis not present

## 2021-08-11 MED ORDER — IOHEXOL 300 MG/ML  SOLN
100.0000 mL | Freq: Once | INTRAMUSCULAR | Status: AC | PRN
  Administered 2021-08-11: 100 mL via INTRAVENOUS

## 2021-08-12 ENCOUNTER — Telehealth: Payer: Self-pay | Admitting: Gastroenterology

## 2021-08-12 ENCOUNTER — Other Ambulatory Visit: Payer: Self-pay

## 2021-08-12 ENCOUNTER — Other Ambulatory Visit (HOSPITAL_BASED_OUTPATIENT_CLINIC_OR_DEPARTMENT_OTHER): Payer: Self-pay

## 2021-08-12 DIAGNOSIS — D472 Monoclonal gammopathy: Secondary | ICD-10-CM

## 2021-08-14 DIAGNOSIS — R7303 Prediabetes: Secondary | ICD-10-CM | POA: Diagnosis not present

## 2021-08-14 DIAGNOSIS — D472 Monoclonal gammopathy: Secondary | ICD-10-CM | POA: Diagnosis not present

## 2021-08-14 DIAGNOSIS — D649 Anemia, unspecified: Secondary | ICD-10-CM | POA: Diagnosis not present

## 2021-08-14 DIAGNOSIS — E119 Type 2 diabetes mellitus without complications: Secondary | ICD-10-CM | POA: Diagnosis not present

## 2021-08-17 ENCOUNTER — Other Ambulatory Visit (HOSPITAL_BASED_OUTPATIENT_CLINIC_OR_DEPARTMENT_OTHER): Payer: Self-pay

## 2021-08-19 ENCOUNTER — Inpatient Hospital Stay: Payer: Medicare Other | Attending: Oncology

## 2021-08-19 DIAGNOSIS — M2550 Pain in unspecified joint: Secondary | ICD-10-CM | POA: Insufficient documentation

## 2021-08-19 DIAGNOSIS — D472 Monoclonal gammopathy: Secondary | ICD-10-CM | POA: Insufficient documentation

## 2021-08-19 DIAGNOSIS — Z952 Presence of prosthetic heart valve: Secondary | ICD-10-CM | POA: Insufficient documentation

## 2021-08-19 DIAGNOSIS — I2510 Atherosclerotic heart disease of native coronary artery without angina pectoris: Secondary | ICD-10-CM | POA: Insufficient documentation

## 2021-08-19 DIAGNOSIS — Z8673 Personal history of transient ischemic attack (TIA), and cerebral infarction without residual deficits: Secondary | ICD-10-CM | POA: Insufficient documentation

## 2021-08-19 DIAGNOSIS — I4891 Unspecified atrial fibrillation: Secondary | ICD-10-CM | POA: Insufficient documentation

## 2021-08-19 DIAGNOSIS — N400 Enlarged prostate without lower urinary tract symptoms: Secondary | ICD-10-CM | POA: Insufficient documentation

## 2021-08-20 ENCOUNTER — Other Ambulatory Visit (HOSPITAL_BASED_OUTPATIENT_CLINIC_OR_DEPARTMENT_OTHER): Payer: Self-pay

## 2021-08-25 ENCOUNTER — Other Ambulatory Visit (HOSPITAL_BASED_OUTPATIENT_CLINIC_OR_DEPARTMENT_OTHER): Payer: Self-pay

## 2021-08-26 ENCOUNTER — Inpatient Hospital Stay (HOSPITAL_BASED_OUTPATIENT_CLINIC_OR_DEPARTMENT_OTHER): Payer: Medicare Other | Admitting: Oncology

## 2021-08-26 VITALS — BP 118/67 | HR 83 | Temp 97.8°F | Resp 18 | Ht 73.0 in | Wt 220.2 lb

## 2021-08-26 DIAGNOSIS — D472 Monoclonal gammopathy: Secondary | ICD-10-CM

## 2021-08-26 DIAGNOSIS — N400 Enlarged prostate without lower urinary tract symptoms: Secondary | ICD-10-CM | POA: Diagnosis not present

## 2021-08-26 DIAGNOSIS — I2510 Atherosclerotic heart disease of native coronary artery without angina pectoris: Secondary | ICD-10-CM | POA: Diagnosis not present

## 2021-08-26 DIAGNOSIS — M2550 Pain in unspecified joint: Secondary | ICD-10-CM | POA: Diagnosis not present

## 2021-08-26 DIAGNOSIS — I4891 Unspecified atrial fibrillation: Secondary | ICD-10-CM | POA: Diagnosis not present

## 2021-08-26 DIAGNOSIS — Z8673 Personal history of transient ischemic attack (TIA), and cerebral infarction without residual deficits: Secondary | ICD-10-CM | POA: Diagnosis not present

## 2021-08-26 DIAGNOSIS — Z952 Presence of prosthetic heart valve: Secondary | ICD-10-CM | POA: Diagnosis not present

## 2021-08-26 NOTE — Progress Notes (Signed)
  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  He had a right retinal artery branch occlusion in February.  He has persistent vision loss in the right eye.  He is now taking Pletal in addition to apixaban.  He reports chronic malaise and arthralgias.  No fever or night sweats.  No recent infections other than an episode of diverticulitis.  He is taking prednisone for the arthralgias.  Objective:  Vital signs in last 24 hours:  Blood pressure 118/67, pulse 83, temperature 97.8 F (36.6 C), temperature source Oral, resp. rate 18, height 6' 1" (1.854 m), weight 220 lb 3.2 oz (99.9 kg), SpO2 98 %.     Lymphatics: No cervical, supraclavicular, axillary, or inguinal nodes Resp: Lungs clear bilaterally Cardio: Regular rate and rhythm GI: Nontender, no hepatosplenomegaly Vascular: No leg edema   Lab Results:  Lab Results  Component Value Date   WBC 13.4 (H) 08/06/2021   HGB 11.6 (L) 08/06/2021   HCT 35.6 (L) 08/06/2021   MCV 83.8 08/06/2021   PLT 225.0 08/06/2021   NEUTROABS 11.2 (H) 08/06/2021    CMP  Lab Results  Component Value Date   NA 140 08/06/2021   K 4.3 08/06/2021   CL 100 08/06/2021   CO2 31 08/06/2021   GLUCOSE 130 (H) 08/06/2021   BUN 22 08/06/2021   CREATININE 1.16 08/06/2021   CALCIUM 9.6 08/06/2021   PROT 7.2 08/06/2021   PROT 7.5 08/06/2021   ALBUMIN 4.1 08/06/2021   AST 21 08/06/2021   ALT 26 08/06/2021   ALKPHOS 88 08/06/2021   BILITOT 0.6 08/06/2021   GFRNONAA >60 04/13/2021   GFRAA 92 11/26/2019  IgM 1115, serum M spike 0.9, kappa free light chain 23.5, lambda free light chain 39.6   Medications: I have reviewed the patient's current medications.   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  There is no clinical or laboratory evidence for progression to multiple myeloma or another lymphoproliferative disorder.  It is possible the IgM monoclonal protein is associated with an underlying collagen vascular disorder.  The mild increase in the IgM and serum M spike levels on the recent labs may be related to lab variation as the current labs were resulted from [facility].  The neutrophilia is likely related to prednisone therapy.  The plan is to continue observation.  He will return for an office and lab visit in 6 months.  Betsy Coder, MD  08/26/2021  12:42 PM

## 2021-08-28 ENCOUNTER — Other Ambulatory Visit (HOSPITAL_BASED_OUTPATIENT_CLINIC_OR_DEPARTMENT_OTHER): Payer: Self-pay

## 2021-09-04 ENCOUNTER — Ambulatory Visit (INDEPENDENT_AMBULATORY_CARE_PROVIDER_SITE_OTHER): Payer: Medicare Other

## 2021-09-04 DIAGNOSIS — I495 Sick sinus syndrome: Secondary | ICD-10-CM | POA: Diagnosis not present

## 2021-09-05 LAB — CUP PACEART REMOTE DEVICE CHECK
Battery Remaining Longevity: 54 mo
Battery Remaining Percentage: 52 %
Brady Statistic RA Percent Paced: 0 %
Brady Statistic RV Percent Paced: 52 %
Date Time Interrogation Session: 20230706225600
Implantable Lead Implant Date: 20170417
Implantable Lead Implant Date: 20170417
Implantable Lead Location: 753859
Implantable Lead Location: 753860
Implantable Lead Model: 7740
Implantable Lead Model: 7741
Implantable Lead Serial Number: 662696
Implantable Lead Serial Number: 751382
Implantable Pulse Generator Implant Date: 20170417
Lead Channel Impedance Value: 682 Ohm
Lead Channel Impedance Value: 732 Ohm
Lead Channel Setting Pacing Amplitude: 2.5 V
Lead Channel Setting Pacing Amplitude: 2.5 V
Lead Channel Setting Pacing Pulse Width: 0.4 ms
Lead Channel Setting Sensing Sensitivity: 2.5 mV
Pulse Gen Serial Number: 718418

## 2021-09-09 ENCOUNTER — Other Ambulatory Visit (HOSPITAL_BASED_OUTPATIENT_CLINIC_OR_DEPARTMENT_OTHER): Payer: Self-pay

## 2021-09-11 ENCOUNTER — Telehealth: Payer: Self-pay | Admitting: *Deleted

## 2021-09-11 NOTE — Telephone Encounter (Signed)
Patient called requesting a Refill on his Fioricet.  Patient stated that it is prescribed for Twice daily and he is requesting #60 not #14. Stated that he does not abuse these and wants a quantity of 60  Please Advise.

## 2021-09-14 ENCOUNTER — Other Ambulatory Visit (HOSPITAL_BASED_OUTPATIENT_CLINIC_OR_DEPARTMENT_OTHER): Payer: Self-pay

## 2021-09-14 ENCOUNTER — Other Ambulatory Visit: Payer: Self-pay | Admitting: Internal Medicine

## 2021-09-14 MED ORDER — HYDROCODONE-ACETAMINOPHEN 5-325 MG PO TABS
ORAL_TABLET | ORAL | 0 refills | Status: DC
  Filled 2021-09-14: qty 120, 30d supply, fill #0

## 2021-09-14 MED ORDER — BUTALBITAL-APAP-CAFFEINE 50-325-40 MG PO TABS
ORAL_TABLET | ORAL | 5 refills | Status: DC
Start: 2021-09-14 — End: 2021-10-22
  Filled 2021-09-14: qty 14, 7d supply, fill #0
  Filled 2021-10-10: qty 14, 7d supply, fill #1

## 2021-09-14 NOTE — Progress Notes (Signed)
Orders Placed

## 2021-09-14 NOTE — Telephone Encounter (Signed)
Rx last refill (08/07/21), 120/0 refill. No treatment agreement one file. Upcoming appointment on (10/07/21).   Please advise.

## 2021-09-16 ENCOUNTER — Other Ambulatory Visit (HOSPITAL_BASED_OUTPATIENT_CLINIC_OR_DEPARTMENT_OTHER): Payer: Self-pay

## 2021-09-16 DIAGNOSIS — D034 Melanoma in situ of scalp and neck: Secondary | ICD-10-CM | POA: Diagnosis not present

## 2021-09-16 DIAGNOSIS — L905 Scar conditions and fibrosis of skin: Secondary | ICD-10-CM | POA: Diagnosis not present

## 2021-09-17 ENCOUNTER — Other Ambulatory Visit (HOSPITAL_BASED_OUTPATIENT_CLINIC_OR_DEPARTMENT_OTHER): Payer: Self-pay

## 2021-09-21 NOTE — Progress Notes (Signed)
Remote pacemaker transmission.   

## 2021-09-22 DIAGNOSIS — D034 Melanoma in situ of scalp and neck: Secondary | ICD-10-CM | POA: Diagnosis not present

## 2021-09-23 ENCOUNTER — Other Ambulatory Visit (HOSPITAL_BASED_OUTPATIENT_CLINIC_OR_DEPARTMENT_OTHER): Payer: Self-pay

## 2021-09-29 HISTORY — PX: MOHS SURGERY: SUR867

## 2021-09-30 DIAGNOSIS — C44319 Basal cell carcinoma of skin of other parts of face: Secondary | ICD-10-CM | POA: Diagnosis not present

## 2021-10-05 ENCOUNTER — Encounter: Payer: Medicare Other | Admitting: Adult Health

## 2021-10-07 ENCOUNTER — Encounter: Payer: Self-pay | Admitting: Internal Medicine

## 2021-10-07 ENCOUNTER — Non-Acute Institutional Stay: Payer: Medicare Other | Admitting: Internal Medicine

## 2021-10-07 ENCOUNTER — Other Ambulatory Visit (HOSPITAL_BASED_OUTPATIENT_CLINIC_OR_DEPARTMENT_OTHER): Payer: Self-pay

## 2021-10-07 VITALS — BP 132/88 | HR 67 | Temp 97.3°F | Ht 73.0 in | Wt 221.0 lb

## 2021-10-07 DIAGNOSIS — E785 Hyperlipidemia, unspecified: Secondary | ICD-10-CM | POA: Diagnosis not present

## 2021-10-07 DIAGNOSIS — R7303 Prediabetes: Secondary | ICD-10-CM

## 2021-10-07 DIAGNOSIS — D649 Anemia, unspecified: Secondary | ICD-10-CM

## 2021-10-07 DIAGNOSIS — M158 Other polyosteoarthritis: Secondary | ICD-10-CM

## 2021-10-07 DIAGNOSIS — Z9889 Other specified postprocedural states: Secondary | ICD-10-CM

## 2021-10-07 DIAGNOSIS — H34231 Retinal artery branch occlusion, right eye: Secondary | ICD-10-CM | POA: Diagnosis not present

## 2021-10-07 DIAGNOSIS — R600 Localized edema: Secondary | ICD-10-CM

## 2021-10-07 DIAGNOSIS — I10 Essential (primary) hypertension: Secondary | ICD-10-CM

## 2021-10-07 MED ORDER — HYDROCODONE-ACETAMINOPHEN 7.5-325 MG PO TABS
1.0000 | ORAL_TABLET | Freq: Three times a day (TID) | ORAL | 0 refills | Status: DC
  Filled 2021-10-07 – 2021-10-10 (×2): qty 90, 30d supply, fill #0

## 2021-10-07 MED ORDER — FUROSEMIDE 20 MG PO TABS
20.0000 mg | ORAL_TABLET | ORAL | 2 refills | Status: DC
  Filled 2021-10-07: qty 30, 70d supply, fill #0
  Filled 2021-12-10: qty 30, 70d supply, fill #1

## 2021-10-07 NOTE — Progress Notes (Unsigned)
Location:  [place] of Service:  Clinic (12)  Provider:   Code Status:  Goals of Care:     04/20/2021    9:24 AM  Advanced Directives  Does Patient Have a Medical Advance Directive? Yes  Type of Advance Directive Living will  Does patient want to make changes to medical advance directive? No - Guardian declined     Chief Complaint  Patient presents with   Medical Management of Chronic Issues    Medical Management of Chronic Issues. 3 Month Follow up    HPI: Patient is a 87 y.o. male seen today for medical management of chronic diseases.    Has h/o CAD, s/p Stent Distal RCA Stent Cath in 09/2019 Repeat Cath Done in 08/22 for Anginal Symptoms. Stents were open  Follows very Closely with Cardiology   H/o Atrial Flutter on Eliquis Due to high runs of A Flutter Digoxin was added  Sinus Node Dysfunction s/p PPM  Orthostatic Hypotension  Severe OsteoArthritis on Norco Prn  and Prednisone PRN  Adrenal Insufficiency per Dr Renne Crigler due to Prolong Steroid use for Osteoarthritis  on Chronic Prednisone  MGUS Follows with Dr Benay Spice for  Labs BPH  Chronic Fatigue and Weakness Underwent R L4-5 hemilaminectomy with diskectomy on 12/26 for severe back pain radiating to right lower extremity Chronic Dizziness   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 carotid ultrasound which did not show any significant stenosis 2D echo which showed a EF of 60% with inferior wall hypokinesis  Here with his nurse who helps him with Med Management  Want to increase  the dose of Norco if continues to have issues with pain.  He has a nurse who manages his pain meds. He continues to suffer from chronic arthritis pain in his neck fingers back leg.  He also takes Fioricet for his migraine headaches mostly 1 a day Continues to take prednisone He also had more swelling in his legs today His weight is slightly up 2 pounds.  He has chronic history of shortness of breath and  fatigue. Patient also saw GI for lower abdominal pain the CT scan of abdomen was negative and the pain is probably due to constipation he is now taking MiraLAX   Past Medical History:  Diagnosis Date   Aortic stenosis    moderate aortic stenosis   Arthritis    Benign prostatic hypertrophy    Chronic systolic heart failure (HCC)    Chronotropic incompetence with sinus node dysfunction (HCC)    Status post Guidant dual-mode, dual-pacing, dual-sensing  pacemaker   implantation now programmed to AAI with recent generator change.   Coronary artery disease    status post multiple prior percutaneous coronary interventions, microvascular angina per Dr Olevia Perches   Diverticulitis sigmoid colon recurrent    Dysfunctional autonomic nervous system    Dyspnea    Heart murmur    History of primary hypertension    Hypercoagulable state ([place])    chronically anticoagulated with coumadin   Hyperlipidemia    Hyperthyroidism    Hypothyroidism    Dr. 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 Lia Foyer,    Prediabetes 09/21/2017   Stroke ([place])    1990    Past Surgical History:  Procedure Laterality Date   AORTIC VALVE REPLACEMENT  03/15/2011   Procedure: AORTIC VALVE REPLACEMENT (AVR);  Surgeon:  Gaye Pollack, MD;  Location: [place];  Service: Open Heart Surgery;  Laterality: N/A;   APPENDECTOMY     CARDIAC CATHETERIZATION     11   CARDIOVERSION     CARDIOVERSION  04/15/2011   Procedure: CARDIOVERSION;  Surgeon: Loralie Champagne, MD;  Location: [place];  Service: Cardiovascular;  Laterality: N/A;   CARDIOVERSION N/A 09/11/2014   Procedure: CARDIOVERSION;  Surgeon: Sueanne Margarita, MD;  Location: [place] ENDOSCOPY;  Service: Cardiovascular;  Laterality: N/A;   CARDIOVERSION N/A 06/27/2015   Procedure: CARDIOVERSION;  Surgeon: Thayer Headings, MD;  Location: [place];  Service:  Cardiovascular;  Laterality: N/A;   CARDIOVERSION N/A 07/04/2015   Procedure: CARDIOVERSION;  Surgeon: Evans Lance, MD;  Location: [place];  Service: Cardiovascular;  Laterality: N/A;   CARDIOVERSION N/A 04/13/2017   Procedure: CARDIOVERSION;  Surgeon: Sueanne Margarita, MD;  Location: [facility] ENDOSCOPY;  Service: Cardiovascular;  Laterality: N/A;   COLONOSCOPY     CORONARY STENT INTERVENTION N/A 10/15/2019   Procedure: CORONARY STENT INTERVENTION;  Surgeon: Jettie Booze, MD;  Location: [place] CV LAB;  Service: Cardiovascular;  Laterality: N/A;   EP IMPLANTABLE DEVICE N/A 06/16/2015   Procedure: Pacemaker Implant;  Surgeon: Evans Lance, MD;  Location: [place] CV LAB;  Service: Cardiovascular;  Laterality: N/A;   hemrrhoidectomy     LEFT AND RIGHT HEART CATHETERIZATION WITH CORONARY ANGIOGRAM Bilateral 02/01/2011   Procedure: LEFT AND RIGHT HEART CATHETERIZATION WITH CORONARY ANGIOGRAM;  Surgeon: Hillary Bow, MD;  Location: [facility] CATH LAB;  Service: Cardiovascular;  Laterality: Bilateral;   LEFT HEART CATH AND CORONARY ANGIOGRAPHY N/A 10/15/2019   Procedure: LEFT HEART CATH AND CORONARY ANGIOGRAPHY;  Surgeon: Jettie Booze, MD;  Location: [place] CV LAB;  Service: Cardiovascular;  Laterality: N/A;   LEFT HEART CATH AND CORONARY ANGIOGRAPHY N/A 10/07/2020   Procedure: LEFT HEART CATH AND CORONARY ANGIOGRAPHY;  Surgeon: Sherren Mocha, MD;  Location: [place] CV LAB;  Service: Cardiovascular;  Laterality: N/A;   LUMBAR LAMINECTOMY/DECOMPRESSION MICRODISCECTOMY Right 02/23/2021   Procedure: Right Lumbar four-five microdiscectomy;  Surgeon: Eustace Moore, MD;  Location: [place];  Service: Neurosurgery;  Laterality: Right;   MAZE  03/15/2011   Procedure: MAZE;  Surgeon: Gaye Pollack, MD;  Location: [place];  Service: Open Heart Surgery;  Laterality: N/A;   PACEMAKER INSERTION  1991   Guidant PPM, most recent Generator Change by Dr Olevia Perches was 08/22/06   RIGHT/LEFT HEART CATH  AND CORONARY ANGIOGRAPHY N/A 07/06/2016   Procedure: Right/Left Heart Cath and Coronary Angiography;  Surgeon: Sherren Mocha, MD;  Location: [place] CV LAB;  Service: Cardiovascular;  Laterality: N/A;   TEE WITHOUT CARDIOVERSION  04/15/2011   Procedure: TRANSESOPHAGEAL ECHOCARDIOGRAM (TEE);  Surgeon: Loralie Champagne, MD;  Location: [place];  Service: Cardiovascular;  Laterality: N/A;   TEE WITHOUT CARDIOVERSION N/A 09/11/2014   Procedure: TRANSESOPHAGEAL ECHOCARDIOGRAM (TEE);  Surgeon: Sueanne Margarita, MD;  Location: [facility] ENDOSCOPY;  Service: Cardiovascular;  Laterality: N/A;    Allergies  Allergen Reactions   Contrast Media [Iodinated Contrast Media] Hives   Gadolinium Derivatives Hives   Iodine-131 Hives   Metrizamide Hives   Tetanus Toxoids     Outpatient Encounter Medications as of 10/07/2021  Medication Sig   amLODipine (NORVASC) 2.5 MG tablet Take 1 tablet (2.5 mg total) by mouth at bedtime.   amoxicillin (AMOXIL) 500 MG capsule Take 4 capsules by mouth 1 hour prior to dental appointment.   apixaban (ELIQUIS) 5 MG  TABS tablet TAKE 1 TABLET BY MOUTH 2 TIMES DAILY. (Patient taking differently: Take 5 mg by mouth 2 (two) times daily.)   butalbital-acetaminophen-caffeine (FIORICET) 50-325-40 MG tablet Take 1 tablet by mouth twice daily as needed for headache. (Patient taking differently: Take 1 tablet by mouth daily as needed.)   Cholecalciferol (VITAMIN D3) 2000 units capsule Take 2,000 Units by mouth daily.   cilostazol (PLETAL) 100 MG tablet Take 1 tablet (100 mg total) by mouth 2 (two) times daily.   Coenzyme Q10 200 MG TABS Take 200 mg by mouth daily.   desoximetasone (TOPICORT) 0.25 % cream Apply 1 application topically 2 (two) times daily. (Patient taking differently: Apply 1 application  topically 2 (two) times daily as needed (rash or irritation).)   diclofenac Sodium (VOLTAREN) 1 % GEL Apply 2 g topically 2 (two) times daily as needed (knee pain).   digoxin (LANOXIN) 0.125 MG  tablet Take 1 tablet (0.125 mg total) by mouth daily. (Patient taking differently: Take 0.125 mg by mouth daily. Not taking it right now due to Gynecomastia)   diltiazem (CARDIZEM) 30 MG tablet Take 30 mg by mouth daily as needed (sbp <130).   diphenhydrAMINE (BENADRYL) 50 MG tablet Take 1 tablet (50 mg total) by mouth as directed.   docusate sodium (COLACE) 100 MG capsule Take 100 mg by mouth 2 (two) times daily.   doxazosin (CARDURA) 4 MG tablet Take 1 tablet by mouth daily (Patient taking differently: Take 4 mg by mouth at bedtime.)   ezetimibe (ZETIA) 10 MG tablet Take 1 tablet (10 mg total) by mouth daily.   fluorouracil (EFUDEX) 5 % cream 1 application Externally Once a day 21 days   furosemide (LASIX) 20 MG tablet Take 1 tablet (20 mg total) by mouth 3 (three) times a week.   HYDROcodone-acetaminophen (NORCO) 7.5-325 MG tablet Take 1 tablet by mouth 3 (three) times daily.   LORazepam (ATIVAN) 1 MG tablet TAKE 1 TABLET BY MOUTH AT BEDTIME. MAY ALSO TAKE 1/2 TABLET DAILY AS NEEDED FOR ANXIETY. (Patient taking differently: Take 1 mg by mouth at bedtime.)   magnesium oxide (MAG-OX) 400 MG tablet Take 400 mg by mouth daily.   metoprolol succinate (TOPROL-XL) 50 MG 24 hr tablet Take 0.5 tablet (25 mg) in the morning and 1 tablet (50 mg) in the evening. (Patient taking differently: 50 mg 2 (two) times daily.)   mometasone (ELOCON) 0.1 % cream Apply 1 application topically daily. (Patient taking differently: Apply 1 application  topically daily as needed (rash or irritation).)   mometasone (ELOCON) 0.1 % cream 1 application Externally Twice a day 14 days   Multiple Vitamins-Minerals (MULTIVITAMIN WITH MINERALS) tablet Take 1 tablet by mouth daily.   nitroGLYCERIN (NITROSTAT) 0.4 MG SL tablet PLACE 1 TABLET UNDER THE TONGUE EVERY 5 MINUTES AS NEEDED FOR CHEST PAIN. (Patient taking differently: Place 0.4 mg under the tongue every 5 (five) minutes as needed for chest pain.)   pantoprazole (PROTONIX) 40  MG tablet TAKE 1 TABLET BY MOUTH DAILY. (Patient taking differently: Take 40 mg by mouth daily.)   polyethylene glycol (MIRALAX / GLYCOLAX) 17 g packet Take 17 g by mouth daily.   potassium chloride SA (KLOR-CON M) 20 MEQ tablet TAKE 1 TABLET BY MOUTH DAILY. (Patient taking differently: Take 20 mEq by mouth daily.)   predniSONE (DELTASONE) 10 MG tablet Take 1 tablet (10 mg total) by mouth daily with breakfast. (Patient taking differently: Take 5 mg by mouth daily with breakfast. Continuous)   rosuvastatin (CRESTOR)  20 MG tablet TAKE 1 TABLET (20 MG TOTAL) BY MOUTH AT BEDTIME. (Patient taking differently: Take 20 mg by mouth at bedtime.)   [DISCONTINUED] HYDROcodone-acetaminophen (NORCO) 5-325 MG tablet Take One and a Half tablet by mouth twice daily and Take One tablet at bedtime for pain.   [DISCONTINUED] HYDROcodone-acetaminophen (NORCO) 5-325 MG tablet Take One and a Half tablet by mouth twice daily and Take One tablet at bedtime for pain.   [DISCONTINUED] predniSONE (DELTASONE) 50 MG tablet Take as directed prior to CT scan   [DISCONTINUED] pregabalin (LYRICA) 75 MG capsule Take 75 mg by mouth 2 (two) times daily.   No facility-administered encounter medications on file as of 10/07/2021.    Review of Systems:  Review of Systems  Constitutional:  Negative for activity change, appetite change and unexpected weight change.  HENT: Negative.    Respiratory:  Positive for shortness of breath. Negative for cough.   Cardiovascular:  Positive for leg swelling.  Gastrointestinal:  Negative for constipation.  Genitourinary:  Negative for frequency.  Musculoskeletal:  Positive for arthralgias and myalgias. Negative for gait problem.  Skin: Negative.  Negative for rash.  Neurological:  Negative for dizziness and weakness.  Psychiatric/Behavioral:  Negative for confusion and sleep disturbance.   All other systems reviewed and are negative.   Health Maintenance  Topic Date Due   TETANUS/TDAP  Never  done   Zoster Vaccines- Shingrix (1 of 2) Never done   Pneumonia Vaccine 37+ Years old (1 - PCV) Never done   COVID-19 Vaccine (4 - Booster for Pfizer series) 12/27/2019   INFLUENZA VACCINE  09/29/2021   HPV VACCINES  Aged Out    Physical Exam: Vitals:   10/07/21 0905  BP: 132/88  Pulse: 67  Temp: (!) 97.3 F (36.3 C)  TempSrc: Skin  SpO2: 94%  Weight: 221 lb (100.2 kg)  Height: '6\' 1"'$  (1.854 m)   Body mass index is 29.16 kg/m. Physical Exam Vitals reviewed.  Constitutional:      Appearance: Normal appearance.  HENT:     Head: Normocephalic.     Nose: Nose normal.     Mouth/Throat:     Mouth: Mucous membranes are moist.     Pharynx: Oropharynx is clear.  Eyes:     Pupils: Pupils are equal, round, and reactive to light.  Cardiovascular:     Rate and Rhythm: Normal rate. Rhythm irregular.     Pulses: Normal pulses.     Heart sounds: No murmur heard. Pulmonary:     Effort: Pulmonary effort is normal. No respiratory distress.     Breath sounds: Normal breath sounds. No rales.  Abdominal:     General: Abdomen is flat. Bowel sounds are normal.     Palpations: Abdomen is soft.  Musculoskeletal:        General: Swelling present.     Cervical back: Neck supple.  Skin:    General: Skin is warm.  Neurological:     General: No focal deficit present.     Mental Status: He is alert and oriented to person, place, and time.  Psychiatric:        Mood and Affect: Mood normal.        Thought Content: Thought content normal.    Labs reviewed: Basic Metabolic Panel: Recent Labs    04/12/21 1429 04/12/21 1441 04/13/21 0109 08/06/21 1151  NA 138 139 136 140  K 4.4 4.4 3.9 4.3  CL 101 103 103 100  CO2 26  --  23 31  GLUCOSE 203* 195* 204* 130*  BUN '16 16 15 22  '$ CREATININE 1.11 1.00 1.03 1.16  CALCIUM 9.1  --  8.9 9.6   Liver Function Tests: Recent Labs    04/12/21 1429 08/06/21 1151  AST 26 21  ALT 19 26  ALKPHOS 82 88  BILITOT 0.6 0.6  PROT 7.5 7.2  7.5   ALBUMIN 4.0 4.1   No results for input(s): "LIPASE", "AMYLASE" in the last 8760 hours. No results for input(s): "AMMONIA" in the last 8760 hours. CBC: Recent Labs    04/12/21 1429 04/12/21 1441 04/13/21 0109 08/06/21 1151  WBC 10.5  --  8.8 13.4*  NEUTROABS 9.5*  --  8.2* 11.2*  HGB 11.2* 11.9* 10.3* 11.6*  HCT 36.6* 35.0* 33.2* 35.6*  MCV 91.0  --  89.5 83.8  PLT 212  --  209 225.0   Lipid Panel: Recent Labs    04/13/21 0109  CHOL 131  HDL 62  LDLCALC 64  TRIG 27  CHOLHDL 2.1   Lab Results  Component Value Date   HGBA1C 6.5 (H) 04/13/2021    Procedures since last visit: No results found.  Assessment/Plan  1. Bilateral leg edema He wants to try Lasix 20 mg 3/week with Potassium Repeat BMP in 2 weeks Also follow Dizziness Closely EF 60 %  2. Prediabetes A1C ordered CBGS were high when he was on high doses of prednisone But have been good recently  3. Anemia, unspecified type Follows with Dr Benay Spice  4. Retinal artery branch occlusion of right eye Now on both Eliquis and Pletal per Neurology  5. Other osteoarthritis involving multiple joints Will increase the Norco to 7.5 mg /325 TID for quality of life Does not want to see Chronic Pain Clinic Most of his meds are now managed by his Nurse Also takes Chronic Prednisone  6. S/P laminectomy Doing well with his back  7. Hyperlipidemia, unspecified hyperlipidemia type On statin LDL 64 in 02/23  8. Essential hypertension Low Dose of Norvasc and Metoprolol 9 Headaches On Fiorcet  10 Coronary artery disease  Continues to have issue with Angina Nitro makes BP drop Not able to do much  S/p Multiple Caths Closely with Cardiologists On Statin   11 Benign prostatic hyperplasia with lower urinary tract symptoms,  Cardura  12 GERD On Protonix  Labs/tests ordered:  BMP and A1C in 2 weeks Next appt:  01/11/2022

## 2021-10-09 DIAGNOSIS — L814 Other melanin hyperpigmentation: Secondary | ICD-10-CM | POA: Diagnosis not present

## 2021-10-09 DIAGNOSIS — L821 Other seborrheic keratosis: Secondary | ICD-10-CM | POA: Diagnosis not present

## 2021-10-09 DIAGNOSIS — L570 Actinic keratosis: Secondary | ICD-10-CM | POA: Diagnosis not present

## 2021-10-09 DIAGNOSIS — Z85828 Personal history of other malignant neoplasm of skin: Secondary | ICD-10-CM | POA: Diagnosis not present

## 2021-10-09 DIAGNOSIS — L578 Other skin changes due to chronic exposure to nonionizing radiation: Secondary | ICD-10-CM | POA: Diagnosis not present

## 2021-10-09 DIAGNOSIS — D225 Melanocytic nevi of trunk: Secondary | ICD-10-CM | POA: Diagnosis not present

## 2021-10-09 DIAGNOSIS — D485 Neoplasm of uncertain behavior of skin: Secondary | ICD-10-CM | POA: Diagnosis not present

## 2021-10-09 DIAGNOSIS — Z85820 Personal history of malignant melanoma of skin: Secondary | ICD-10-CM | POA: Diagnosis not present

## 2021-10-10 ENCOUNTER — Other Ambulatory Visit: Payer: Self-pay | Admitting: Internal Medicine

## 2021-10-10 ENCOUNTER — Other Ambulatory Visit: Payer: Self-pay | Admitting: Cardiovascular Disease

## 2021-10-12 ENCOUNTER — Other Ambulatory Visit (HOSPITAL_BASED_OUTPATIENT_CLINIC_OR_DEPARTMENT_OTHER): Payer: Self-pay

## 2021-10-12 DIAGNOSIS — L821 Other seborrheic keratosis: Secondary | ICD-10-CM | POA: Insufficient documentation

## 2021-10-12 DIAGNOSIS — D225 Melanocytic nevi of trunk: Secondary | ICD-10-CM | POA: Insufficient documentation

## 2021-10-12 DIAGNOSIS — L219 Seborrheic dermatitis, unspecified: Secondary | ICD-10-CM | POA: Insufficient documentation

## 2021-10-12 DIAGNOSIS — Z85820 Personal history of malignant melanoma of skin: Secondary | ICD-10-CM | POA: Insufficient documentation

## 2021-10-12 MED ORDER — LORAZEPAM 1 MG PO TABS
ORAL_TABLET | ORAL | 5 refills | Status: AC
  Filled 2021-10-12: qty 45, fill #0
  Filled 2021-10-21: qty 45, 30d supply, fill #0
  Filled 2021-11-18: qty 45, 30d supply, fill #1
  Filled 2022-01-03: qty 45, 30d supply, fill #2
  Filled 2022-02-08: qty 45, 30d supply, fill #3
  Filled 2022-03-15: qty 45, 30d supply, fill #4

## 2021-10-12 MED ORDER — PANTOPRAZOLE SODIUM 40 MG PO TBEC
DELAYED_RELEASE_TABLET | Freq: Every day | ORAL | 3 refills | Status: DC
  Filled 2021-10-12: qty 90, 90d supply, fill #0

## 2021-10-12 NOTE — Telephone Encounter (Signed)
Refill request for lorazepam '1mg'$  tablet.  Medication pended and sent to Dr. Veleta Miners for approval

## 2021-10-14 ENCOUNTER — Other Ambulatory Visit (HOSPITAL_BASED_OUTPATIENT_CLINIC_OR_DEPARTMENT_OTHER): Payer: Self-pay

## 2021-10-19 ENCOUNTER — Other Ambulatory Visit (HOSPITAL_BASED_OUTPATIENT_CLINIC_OR_DEPARTMENT_OTHER): Payer: Self-pay

## 2021-10-21 ENCOUNTER — Telehealth: Payer: Self-pay | Admitting: *Deleted

## 2021-10-21 NOTE — Progress Notes (Signed)
Cardiology Office Note:    Date:  10/23/2021   ID:  Kevin Buff, [name], DOB 1934-03-30, MRN [id number]  PCP:  [name], [name]  Cardiologist:  Buford Dresser, [name]  Referring [name]: [name], [name]   CC: follow up  History of Present Illness:    Kevin Buff, [name] is a 87 y.o. male with a hx of atypical atrial flutter/atrial tachycardia, atrial fibrillation, prior PICA CVA, CAD with multiple prior stents, aortic valve disease s/p bioprosthetic AVR, orthostatic hypotension, and chronic diastolic heart failure who is seen for follow-up. He was initially seen as a new patient to me 05/16/20.  On 02/23/2021 he underwent right L4-L5 microdiscectomy. No issues with arrhythmia perioperatively. Held DOAC perioperatively but had restarted.  At his last appointment, he continued to struggle with generalized weakness which he attributed to atrial flutter. At night his heart rate was typically in the 60s, and was often around 85 bpm during the day. Most of the time his heart rate was under 100 bpm. He complained of occasional lightheadedness/dizziness, especially after eating.    He followed up with Dr. Burt Knack 04/24/2021 and his blood pressure was above goal (142/90). He was started on amlodipine 2.5 mg daily. Started with low dose due to history of autonomic dysfunction and orthostatic hypotension.  Today: Brings a list of issues today: -continues to have severe arthritic pain. Nothing really helping, but he is trying to manage. Still with BPH but does not want to self cath. Struggling with GI pain, watching Hgb  -reviewed retinal artery occlusion, visits with Dr. Leonie Man. On Pletal now. -worsening LE on R side. -still with orthostatic hypotension and periodic spikes.  We discussed the above issues at length. He understands there are no simple fixes.   Denies chest pain, shortness of breath at rest. No PND, orthopnea, or unexpected weight gain. No syncope.   Past Medical History:   Diagnosis Date   Aortic stenosis    moderate aortic stenosis   Arthritis    Benign prostatic hypertrophy    Chronic systolic heart failure (HCC)    Chronotropic incompetence with sinus node dysfunction (HCC)    Status post Guidant dual-mode, dual-pacing, dual-sensing  pacemaker   implantation now programmed to AAI with recent generator change.   Coronary artery disease    status post multiple prior percutaneous coronary interventions, microvascular angina per Dr Olevia Perches   Diverticulitis sigmoid colon recurrent    Dysfunctional autonomic nervous system    Dyspnea    Heart murmur    History of primary hypertension    Hypercoagulable state ([place])    chronically anticoagulated with coumadin   Hyperlipidemia    Hyperthyroidism    Hypothyroidism    Dr. 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 Lia Foyer,    Prediabetes 09/21/2017   Stroke ([place])    1990    Past Surgical History:  Procedure Laterality Date   AORTIC VALVE REPLACEMENT  03/15/2011   Procedure: AORTIC VALVE REPLACEMENT (AVR);  Surgeon: Gaye Pollack, [name];  Location: [place];  Service: Open Heart Surgery;  Laterality: N/A;   APPENDECTOMY     CARDIAC CATHETERIZATION     11   CARDIOVERSION     CARDIOVERSION  04/15/2011   Procedure: CARDIOVERSION;  Surgeon: Loralie Champagne, [name];  Location: [place];  Service: Cardiovascular;  Laterality: N/A;   CARDIOVERSION N/A 09/11/2014  Procedure: CARDIOVERSION;  Surgeon: Sueanne Margarita, [name];  Location[facility] ENDOSCOPY;  Service: Cardiovascular;  Laterality: N/A;   CARDIOVERSION N/A 06/27/2015   Procedure: CARDIOVERSION;  Surgeon: Thayer Headings, [name];  Location: [place];  Service: Cardiovascular;  Laterality: N/A;   CARDIOVERSION N/A 07/04/2015   Procedure: CARDIOVERSION;  Surgeon: Evans Lance, [name];  Location: [place];  Service: Cardiovascular;  Laterality:  N/A;   CARDIOVERSION N/A 04/13/2017   Procedure: CARDIOVERSION;  Surgeon: Sueanne Margarita, [name];  Location: [facility] ENDOSCOPY;  Service: Cardiovascular;  Laterality: N/A;   COLONOSCOPY     CORONARY STENT INTERVENTION N/A 10/15/2019   Procedure: CORONARY STENT INTERVENTION;  Surgeon: Jettie Booze, [name];  Location: [place] CV LAB;  Service: Cardiovascular;  Laterality: N/A;   EP IMPLANTABLE DEVICE N/A 06/16/2015   Procedure: Pacemaker Implant;  Surgeon: Evans Lance, [name];  Location: [place] CV LAB;  Service: Cardiovascular;  Laterality: N/A;   hemrrhoidectomy     LEFT AND RIGHT HEART CATHETERIZATION WITH CORONARY ANGIOGRAM Bilateral 02/01/2011   Procedure: LEFT AND RIGHT HEART CATHETERIZATION WITH CORONARY ANGIOGRAM;  Surgeon: Hillary Bow, [name];  Location: [facility] CATH LAB;  Service: Cardiovascular;  Laterality: Bilateral;   LEFT HEART CATH AND CORONARY ANGIOGRAPHY N/A 10/15/2019   Procedure: LEFT HEART CATH AND CORONARY ANGIOGRAPHY;  Surgeon: Jettie Booze, [name];  Location: [place] CV LAB;  Service: Cardiovascular;  Laterality: N/A;   LEFT HEART CATH AND CORONARY ANGIOGRAPHY N/A 10/07/2020   Procedure: LEFT HEART CATH AND CORONARY ANGIOGRAPHY;  Surgeon: Sherren Mocha, [name];  Location: [place] CV LAB;  Service: Cardiovascular;  Laterality: N/A;   LUMBAR LAMINECTOMY/DECOMPRESSION MICRODISCECTOMY Right 02/23/2021   Procedure: Right Lumbar four-five microdiscectomy;  Surgeon: Eustace Moore, [name];  Location: [place];  Service: Neurosurgery;  Laterality: Right;   MAZE  03/15/2011   Procedure: MAZE;  Surgeon: Gaye Pollack, [name];  Location: [place];  Service: Open Heart Surgery;  Laterality: N/A;   PACEMAKER INSERTION  1991   Guidant PPM, most recent Generator Change by Dr Olevia Perches was 08/22/06   RIGHT/LEFT HEART CATH AND CORONARY ANGIOGRAPHY N/A 07/06/2016   Procedure: Right/Left Heart Cath and Coronary Angiography;  Surgeon: Sherren Mocha, [name];  Location: [place] CV LAB;  Service:  Cardiovascular;  Laterality: N/A;   TEE WITHOUT CARDIOVERSION  04/15/2011   Procedure: TRANSESOPHAGEAL ECHOCARDIOGRAM (TEE);  Surgeon: Loralie Champagne, [name];  Location: [place];  Service: Cardiovascular;  Laterality: N/A;   TEE WITHOUT CARDIOVERSION N/A 09/11/2014   Procedure: TRANSESOPHAGEAL ECHOCARDIOGRAM (TEE);  Surgeon: Sueanne Margarita, [name];  Location: [facility] ENDOSCOPY;  Service: Cardiovascular;  Laterality: N/A;    Current Medications: Current Outpatient Medications on File Prior to Visit  Medication Sig   amLODipine (NORVASC) 2.5 MG tablet Take 1 tablet (2.5 mg total) by mouth at bedtime.   apixaban (ELIQUIS) 5 MG TABS tablet TAKE 1 TABLET BY MOUTH 2 TIMES DAILY. (Patient taking differently: Take 5 mg by mouth 2 (two) times daily.)   butalbital-acetaminophen-caffeine (FIORICET) 50-325-40 MG tablet Take 1 tablet by mouth twice daily as needed for headache.   Cholecalciferol (VITAMIN D3) 2000 units capsule Take 2,000 Units by mouth daily.   cilostazol (PLETAL) 100 MG tablet Take 1 tablet (100 mg total) by mouth 2 (two) times daily.   Coenzyme Q10 200 MG TABS Take 200 mg by mouth daily.   desoximetasone (TOPICORT) 0.25 % cream Apply 1 application topically 2 (two) times daily. (Patient taking differently: Apply 1 application  topically 2 (two) times daily  as needed (rash or irritation).)   diclofenac Sodium (VOLTAREN) 1 % GEL Apply 2 g topically 2 (two) times daily as needed (knee pain).   digoxin (LANOXIN) 0.125 MG tablet Take 1 tablet (0.125 mg total) by mouth daily. (Patient taking differently: Take 0.125 mg by mouth daily. Not taking it right now due to Gynecomastia)   docusate sodium (COLACE) 100 MG capsule Take 100 mg by mouth 2 (two) times daily.   doxazosin (CARDURA) 4 MG tablet Take 1 tablet by mouth daily (Patient taking differently: Take 4 mg by mouth at bedtime.)   ezetimibe (ZETIA) 10 MG tablet Take 1 tablet (10 mg total) by mouth daily.   fluorouracil (EFUDEX) 5 % cream 1 application  Externally Once a day 21 days   furosemide (LASIX) 20 MG tablet Take 1 tablet (20 mg total) by mouth 3 (three) times a week.   HYDROcodone-acetaminophen (NORCO) 7.5-325 MG tablet Take 1 tablet by mouth 3 (three) times daily.   LORazepam (ATIVAN) 1 MG tablet TAKE 1 TABLET BY MOUTH AT BEDTIME. MAY ALSO TAKE 1/2 TABLET DAILY AS NEEDED FOR ANXIETY.   magnesium oxide (MAG-OX) 400 MG tablet Take 400 mg by mouth daily.   metoprolol succinate (TOPROL-XL) 50 MG 24 hr tablet Take 0.5 tablet (25 mg) in the morning and 1 tablet (50 mg) in the evening. (Patient taking differently: 50 mg 2 (two) times daily.)   mometasone (ELOCON) 0.1 % cream Apply 1 application topically daily. (Patient taking differently: Apply 1 application  topically daily as needed (rash or irritation).)   mometasone (ELOCON) 0.1 % cream 1 application Externally Twice a day 14 days   Multiple Vitamins-Minerals (MULTIVITAMIN WITH MINERALS) tablet Take 1 tablet by mouth daily.   pantoprazole (PROTONIX) 40 MG tablet TAKE 1 TABLET BY MOUTH DAILY.   polyethylene glycol (MIRALAX / GLYCOLAX) 17 g packet Take 17 g by mouth daily.   potassium chloride SA (KLOR-CON M) 20 MEQ tablet TAKE 1 TABLET BY MOUTH DAILY. (Patient taking differently: Take 20 mEq by mouth daily.)   predniSONE (DELTASONE) 10 MG tablet Take 1 tablet (10 mg total) by mouth daily with breakfast. (Patient taking differently: Take 5 mg by mouth daily with breakfast. Continuous)   rosuvastatin (CRESTOR) 20 MG tablet TAKE 1 TABLET (20 MG TOTAL) BY MOUTH AT BEDTIME. (Patient taking differently: Take 20 mg by mouth at bedtime.)   amoxicillin (AMOXIL) 500 MG capsule Take 4 capsules by mouth 1 hour prior to dental appointment. (Patient not taking: Reported on 10/23/2021)   diltiazem (CARDIZEM) 30 MG tablet Take 30 mg by mouth daily as needed (sbp <130). (Patient not taking: Reported on 10/23/2021)   diphenhydrAMINE (BENADRYL) 50 MG tablet Take 1 tablet (50 mg total) by mouth as directed.  (Patient not taking: Reported on 10/23/2021)   nitroGLYCERIN (NITROSTAT) 0.4 MG SL tablet PLACE 1 TABLET UNDER THE TONGUE EVERY 5 MINUTES AS NEEDED FOR CHEST PAIN. (Patient not taking: Reported on 10/23/2021)   [DISCONTINUED] pregabalin (LYRICA) 75 MG capsule Take 75 mg by mouth 2 (two) times daily.   No current facility-administered medications on file prior to visit.     Allergies:   Contrast media [iodinated contrast media], Gadolinium derivatives, Iodine-131, Metrizamide, and Tetanus toxoids   Social History   Tobacco Use   Smoking status: Never   Smokeless tobacco: Never  Vaping Use   Vaping Use: Never used  Substance Use Topics   Alcohol use: No    Alcohol/week: 0.0 standard drinks of alcohol   Drug use:  No    Family History: family history includes Anorexia nervosa in his daughter; Atrial fibrillation in his brother; CAD in his brother; Depression in his daughter; Heart disease in his brother; Hypertension in his son; Sarcoidosis in his brother. There is no history of Anesthesia problems, Hypotension, Malignant hyperthermia, or Pseudochol deficiency.  ROS:   Please see the history of present illness. All other systems are reviewed and negative.    EKGs/Labs/Other Studies Reviewed:    The following studies were reviewed today:  Echocardiogram  04/13/2021: 1. Left ventricular ejection fraction, by estimation, is 60 to 65%. The  left ventricle has normal function. The left ventricle demonstrates  regional wall motion abnormalities. Basal inferior hypokinesis. There is  moderate left ventricular hypertrophy.  Left ventricular diastolic parameters are indeterminate.   2. Right ventricular systolic function is mildly reduced. The right  ventricular size is mildly enlarged. There is mildly elevated pulmonary  artery systolic pressure. The estimated right ventricular systolic  pressure is 51.8 mmHg.   3. Left atrial size was severely dilated.   4. The mitral valve is  degenerative. Trivial mitral valve regurgitation.  No evidence of mitral stenosis. Moderate mitral annular calcification.   5. There is a 25 mm Edwards Magna-Ease valve present in the aortic  position.      Aortic valve regurgitation is not visualized. Aortic valve mean  gradient measures 14.0 mmHg. Vmax 2.5 m/s, EOA 2.7 cm^2, DI 0.7. Echo findings are consistent with normal structure and function of the aortic valve prosthesis.   6. The inferior vena cava is dilated in size with >50% respiratory  variability, suggesting right atrial pressure of 8 mmHg.  Bilateral Carotid Doppler  04/13/2021: Summary:  Right Carotid: Velocities in the right ICA are consistent with a 1-39%  stenosis.   Left Carotid: Velocities in the left ICA are consistent with a 1-39%  stenosis.   Vertebrals: Right vertebral artery demonstrates antegrade flow. Left  vertebral artery demonstrates an occlusion.   CTA Head/Neck  04/12/2021: IMPRESSION: 1. Negative CTA for acute large vessel occlusion or other acute vascular abnormality. 2. Occlusion of the left vertebral artery at its origin, which remains occluded within the neck. Irregular with irregular attenuated flow within the intracranial left V4 segment, likely retrograde in nature across the vertebrobasilar junction. Multifocal moderate to severe left V4 stenoses. 3. Additional mild for age atheromatous change elsewhere about the major arterial vasculature of the head and neck. No other hemodynamically significant or correctable stenosis.  Monitor 11/11/2020: 1. Atrial flutter with a CVR 2. Rare PVC's and NSVT 3. No prolonged pauses 4. Brief episodes of RVR with an ave HR of 63/min and briefly up to 130/min.  Left Heart Cath 10/07/2020:   Prox RCA lesion is 30% stenosed.   Mid RCA lesion is 40% stenosed.   Prox LAD to Mid LAD lesion is 40% stenosed.   Previously placed Mid Cx to Dist Cx stent (unknown type) is  widely patent.   Non-stenotic Dist RCA  lesion was previously treated.   Patent left main with no stenosis Patent LAD with mild-moderate proximal stenosis unchanged from previous cardiac cath studies Patent LCx stent with no significant stenosis Patent RCA with mild diffuse plaquing and patent stent in the distal RCA Normal LV filling pressure and no transaortic gradient across bioprosthetic aortic valve   Recommend: medical therapy.    Note the patient has frequent runs of PAT throughout the procedure with HR above 160 bpm and drop in blood pressure down to  the 70's during these runs  Echo 01-18-2020:   1. Left ventricular ejection fraction, by estimation, is 45 to 50%. The  left ventricle has mildly decreased function. The left ventricle  demonstrates regional wall motion abnormalities (see scoring  diagram/findings for description). There is mild left  ventricular hypertrophy. Left ventricular diastolic function could not be  evaluated. There is severe akinesis of the left ventricular, basal-mid  inferior wall suggestive of scar.   2. Left atrial size was moderately dilated.   3. The mitral valve is abnormal. Mild mitral valve regurgitation.   4. The aortic valve has been repaired/replaced with a bioprosthetic valve  (2013). Aortic valve area, by VTI measures 1.27 cm. Aortic valve mean  gradient measures 9.0 mmHg. Aortic valve Vmax measures 2.05 m/s. DI of  0.33.   5. Aortic dilatation noted. There is mild dilatation of the ascending  aorta, measuring 41 mm.   6. Right atrial size was mildly dilated.   7. Right ventricular systolic function is low normal. The right  ventricular size is normal. There is normal pulmonary artery systolic  pressure.   8. The inferior vena cava is dilated in size with >50% respiratory  variability, suggesting right atrial pressure of 8 mmHg.   Comparison(s): No significant change from prior study. 10/15/2019: LVEF  45-50%, basal to mid inferior hypokinesis, bioprosthetic AVR - mean   gradient 7 mmHg.    Cardiac Cath 10-15-2019: Conclusion     Prox LAD to Mid LAD lesion is 40% stenosed. Prox RCA lesion is 30% stenosed. Mid RCA lesion is 40% stenosed. Previously placed Mid Cx to Dist Cx stent (unknown type) is widely patent. Dist RCA lesion is 75% stenosed. This was new compared to prior cath in 2018. A drug-eluting stent was successfully placed using a SYNERGY XD 2.75X16. THis was dilated to 3.0 mm and postdilated to 3.4 mm. Post intervention, there is a 0% residual stenosis. There is moderate left ventricular systolic dysfunction. LV end diastolic pressure is normal. The left ventricular ejection fraction is 35-45% by visual estimate. Global hypokinesis.   No aspirin given Eliquis and Plavix.  Could use Plavix longer if no bleeding issues, but bare minimum would be one month.     Continue aggressive secondary prevention and management of his tachycardia.    Diagnostic Dominance: Right   Left Main  Vessel is angiographically normal.  Left Anterior Descending  Prox LAD to Mid LAD lesion is 40% stenosed. There is mild diffuse proximal LAD stenosis unchanged from previous study. The LAD wraps around the LV apex  Left Circumflex  Previously placed Mid Cx to Dist Cx stent (unknown type) is widely patent.  Right Coronary Artery  There is mild diffuse disease throughout the vessel. Large, dominant vessel. There are diffuse irregularities with mild stenoses in the proximal and mid vessel. The distal vessel is widely patent. The PDA and PLA branches are patent.  Prox RCA lesion is 30% stenosed. The lesion is calcified.  Mid RCA lesion is 40% stenosed. The lesion is calcified.  Dist RCA lesion is 75% stenosed.    Intervention     Dist RCA lesion  Stent  CATH LAUNCHER 6FR JR4 guide catheter was inserted. Lesion crossed with guidewire using a WIRE ASAHI PROWATER 180CM. Pre-stent angioplasty was performed using a BALLOON SAPPHIRE 2.5X12. A drug-eluting stent was  successfully placed using a SYNERGY XD 2.75X16. Stent strut is well apposed. Post-stent angioplasty was performed using a BALLOON SAPPHIRE [place] 3.25X12.  Post-Intervention Lesion Assessment  The  intervention was successful. Pre-interventional TIMI flow is 3. Post-intervention TIMI flow is 3. No complications occurred at this lesion.  There is a 0% residual stenosis post intervention.    EKG:  EKG is personally reviewed.    10/23/2021:  not ordered today 03/16/2021: not ordered 12/10/2020: atrial flutter, V paced at 60 bpm 09/15/2020: atrial tachycardia/atypical atrial flutter at 74 bpm, RBBB, LAFB 05/16/2020: atrial tachycardia with a ventricular rate of 70 bpm  Recent Labs: 08/06/2021: ALT 26 10/22/2021: BUN 11; Creatinine 1.0; Hemoglobin 11.0; Platelets 214; Potassium 4.1; Sodium 139   Recent Lipid Panel    Component Value Date/Time   CHOL 131 04/13/2021 0109   CHOL 158 09/02/2017 1006   TRIG 27 04/13/2021 0109   HDL 62 04/13/2021 0109   HDL 72 09/02/2017 1006   CHOLHDL 2.1 04/13/2021 0109   VLDL 5 04/13/2021 0109   LDLCALC 64 04/13/2021 0109   LDLCALC 68 09/02/2017 1006   LDLDIRECT 117.9 11/16/2010 1355    Physical Exam:    VS:  BP 102/60 (BP Location: Left Arm, Patient Position: Sitting, Cuff Size: Large)   Pulse 65   Ht '6\' 1"'$  (1.854 m)   Wt 224 lb (101.6 kg)   SpO2 93%   BMI 29.55 kg/m     Wt Readings from Last 3 Encounters:  10/07/21 221 lb (100.2 kg)  08/26/21 220 lb 3.2 oz (99.9 kg)  08/06/21 220 lb 3.2 oz (99.9 kg)    GEN: Well nourished, well developed in no acute distress HEENT: Normal, moist mucous membranes NECK: No JVD CARDIAC:  largely regular rhythm, normal S1 and S2, no rubs or gallops. 1/6 systolic murmur. VASCULAR: Radial and DP pulses 2+ bilaterally. No carotid bruits RESPIRATORY:  Clear to auscultation without rales, wheezing or rhonchi  ABDOMEN: Soft, non-tender, non-distended MUSCULOSKELETAL:  Ambulates independently SKIN: Warm and dry, no  edema NEUROLOGIC:  Alert and oriented x 3. No focal neuro deficits noted. PSYCHIATRIC:  Normal affect    ASSESSMENT:    1. Atrial tachycardia ([place])   2. S/P AVR (aortic valve replacement)   3. Coronary artery disease of native artery of native heart with stable angina pectoris ([place])   4. Chronic fatigue   5. Secondary hypercoagulable state ([place])   6. Labile hypertension      PLAN:    Labile blood pressure LE edema -he is on only a small dose of amlodipine, but this may be contributing some to LE edema -has lasix PRN -with orthostasis, would not be overly aggressive with BP control. As below, low threshold to stop amlodipine  Fatigue, chronic -unclear etiology. Symptoms did not improve after PCI in 2021. No significant progression of disease on recent cath -continue metoprolol succinate dose 50 mg BID. Prior was on 100 mg BID  Chronic systolic and diastolic heart failure Low normal EF (45-50% with akinetic basal-mid inferior wall 12/2019), suspect ischemic cardiomyopathy (given focal WMA) vs arrythmia mediated -similar to echo from 10/15/19, but echo 02/13/2019 read as 55-60% -metoprolol as above -History of orthostatic hypotension, need to be cautious about antihypertensive agents. He is borderline low in the office today. Would have low threshold to stop amlodipine.  Atrial tachycardia: history of atrial fib, atrial flutter, and atrial tachycardia with secondary hypercoagulable state S/P Dual Chamber PPM -has had extensive evaluation at [facility], [facility], [facility], [facility] etc without complete resolution -follows closely with Dr. Lovena Le. Has discussed with Dr. Quentin Ore as well.  -now on digoxin as well with more stable rates -continue metoprolol 50 mg BID,  he will contact me with issues  History of CVA: -continue apixaban, rosuvastatin  Bioprosthetic AVR: -no longer on aspirin, is on apixaban alone -echo reviewed as above  CAD with stable angina: -has not tolerated imdur for  angina -on metoprolol succinate -reviewed cath from 09/2019. Had PCI to RCA, did not improve fatigue -did not have improvement on ranolazine in the past -most recent cath without significant change  Hypercholesterolemia: -continue rosuvastatin, ezetimibe -Last LDL 45, goal <70.   Plan for follow up: 6 months, will contact me if symptoms worsen  Total time of encounter: 63 minutes total time of encounter, including 55 minutes spent in face-to-face patient care. This time includes coordination of care and counseling regarding above conditions. Remainder of non-face-to-face time involved reviewing chart documents/testing relevant to the patient encounter and documentation in the medical record.  Buford Dresser, MD, PhD, [facility]    Medication Adjustments/Labs and Tests Ordered: Current medicines are reviewed at length with the patient today.  Concerns regarding medicines are outlined above.   Orders Placed This Encounter  Procedures   EKG 12-Lead   Meds ordered this encounter  Medications   metoprolol succinate (TOPROL-XL) 50 MG 24 hr tablet    Sig: Take 1 tablet (50 mg total) by mouth 2 (two) times daily.    Dispense:  180 tablet    Refill:  3   There are no Patient Instructions on file for this visit.   Signed, Buford Dresser, MD PhD 10/23/2021     [place]

## 2021-10-21 NOTE — Telephone Encounter (Signed)
Erlene Quan, RN  [phone] called and wanted to know:  1.)can a CBC can be added to patient's upcoming lab appointment. Suppose to have repeat labs in 2 weeks.   2.)And when is the appointment and is it at [facility]  3.)Wants to know if Fioricet quantity be increased to #30 to last a month.   Please Advise.

## 2021-10-22 ENCOUNTER — Other Ambulatory Visit: Payer: Self-pay | Admitting: Internal Medicine

## 2021-10-22 ENCOUNTER — Telehealth: Payer: Self-pay | Admitting: Gastroenterology

## 2021-10-22 ENCOUNTER — Other Ambulatory Visit (HOSPITAL_BASED_OUTPATIENT_CLINIC_OR_DEPARTMENT_OTHER): Payer: Self-pay

## 2021-10-22 LAB — VITAMIN B12: Vitamin B-12: 583

## 2021-10-22 LAB — CBC AND DIFFERENTIAL
HCT: 33 — AB (ref 41–53)
Hemoglobin: 11 — AB (ref 13.5–17.5)
Platelets: 214 10*3/uL (ref 150–400)
WBC: 7.3

## 2021-10-22 LAB — CBC: RBC: 3.81 — AB (ref 3.87–5.11)

## 2021-10-22 LAB — BASIC METABOLIC PANEL
BUN: 11 (ref 4–21)
CO2: 28 — AB (ref 13–22)
Chloride: 100 (ref 99–108)
Creatinine: 1 (ref 0.6–1.3)
Glucose: 125
Potassium: 4.1 mEq/L (ref 3.5–5.1)
Sodium: 139 (ref 137–147)

## 2021-10-22 LAB — HEMOGLOBIN A1C: Hemoglobin A1C: 7.4

## 2021-10-22 LAB — COMPREHENSIVE METABOLIC PANEL
Calcium: 9.2 (ref 8.7–10.7)
eGFR: 74

## 2021-10-22 MED ORDER — BUTALBITAL-APAP-CAFFEINE 50-325-40 MG PO TABS
ORAL_TABLET | ORAL | 5 refills | Status: DC
  Filled 2021-10-22: qty 30, 7d supply, fill #0
  Filled 2021-11-11: qty 30, 7d supply, fill #1
  Filled 2021-12-12: qty 30, 7d supply, fill #2
  Filled 2022-01-14: qty 30, 7d supply, fill #4
  Filled 2022-01-14: qty 30, 7d supply, fill #3
  Filled 2022-02-14: qty 30, 7d supply, fill #4

## 2021-10-22 NOTE — Telephone Encounter (Signed)
Inbound call from patient  home nurse to schedule patient for and OV with provider. Advised nurse the next available apt is 11/1. Nurse states patient needs to be seen for F/U visit from a CT scan patient had previously. Nurse also states patient has been experiencing nausea upper gastric pain for the last 8 weeks that has been causing patient to have issues. Nurse is requesting a call back to possibly have patient scheduled sooner. Please give a call back to further advise.  Thank you

## 2021-10-22 NOTE — Telephone Encounter (Signed)
Spoke with Maudie Mercury (home nurse) who stated patient has been experiencing nausea & abdominal pain, despite taking pantoprazole in the morning & zengrid at night. Follow up scheduled for 10/27/21 at 3:00 pm with Anderson Malta, [place].

## 2021-10-22 NOTE — Telephone Encounter (Signed)
CBC was added to her lab and it was scheduled for today I will refill his Fioricet for 30 days

## 2021-10-23 ENCOUNTER — Ambulatory Visit (INDEPENDENT_AMBULATORY_CARE_PROVIDER_SITE_OTHER): Payer: Medicare Other | Admitting: Cardiology

## 2021-10-23 ENCOUNTER — Telehealth: Payer: Self-pay | Admitting: Internal Medicine

## 2021-10-23 ENCOUNTER — Encounter (HOSPITAL_BASED_OUTPATIENT_CLINIC_OR_DEPARTMENT_OTHER): Payer: Self-pay | Admitting: Cardiology

## 2021-10-23 ENCOUNTER — Encounter: Payer: Self-pay | Admitting: Internal Medicine

## 2021-10-23 ENCOUNTER — Other Ambulatory Visit (HOSPITAL_BASED_OUTPATIENT_CLINIC_OR_DEPARTMENT_OTHER): Payer: Self-pay

## 2021-10-23 VITALS — BP 102/60 | HR 65 | Ht 73.0 in | Wt 224.0 lb

## 2021-10-23 DIAGNOSIS — D6869 Other thrombophilia: Secondary | ICD-10-CM | POA: Diagnosis not present

## 2021-10-23 DIAGNOSIS — I209 Angina pectoris, unspecified: Secondary | ICD-10-CM | POA: Diagnosis not present

## 2021-10-23 DIAGNOSIS — R0989 Other specified symptoms and signs involving the circulatory and respiratory systems: Secondary | ICD-10-CM

## 2021-10-23 DIAGNOSIS — Z952 Presence of prosthetic heart valve: Secondary | ICD-10-CM

## 2021-10-23 DIAGNOSIS — R5382 Chronic fatigue, unspecified: Secondary | ICD-10-CM

## 2021-10-23 DIAGNOSIS — I25118 Atherosclerotic heart disease of native coronary artery with other forms of angina pectoris: Secondary | ICD-10-CM | POA: Diagnosis not present

## 2021-10-23 DIAGNOSIS — I471 Supraventricular tachycardia: Secondary | ICD-10-CM

## 2021-10-23 MED ORDER — METOPROLOL SUCCINATE ER 50 MG PO TB24
50.0000 mg | ORAL_TABLET | Freq: Two times a day (BID) | ORAL | 3 refills | Status: DC
  Filled 2021-10-23 – 2022-01-18 (×6): qty 180, 90d supply, fill #0
  Filled 2022-04-12: qty 180, 90d supply, fill #1

## 2021-10-23 NOTE — Telephone Encounter (Signed)
LMOM to return call.

## 2021-10-23 NOTE — Telephone Encounter (Signed)
Please call patient and Let him know that his Labs show stable Hgb. White count is normal His A1C is elevated at 7.4 We will discuss more on Next visit

## 2021-10-26 ENCOUNTER — Other Ambulatory Visit (HOSPITAL_BASED_OUTPATIENT_CLINIC_OR_DEPARTMENT_OTHER): Payer: Self-pay

## 2021-10-26 NOTE — Telephone Encounter (Signed)
Discussed results with patient, patient verbalized understanding of results. Patient preferred to schedule a sooner appt in September to discuss A1c versus waiting until routine visit in November 2023

## 2021-10-27 ENCOUNTER — Other Ambulatory Visit (HOSPITAL_BASED_OUTPATIENT_CLINIC_OR_DEPARTMENT_OTHER): Payer: Self-pay

## 2021-10-27 ENCOUNTER — Ambulatory Visit: Payer: Medicare Other | Admitting: Physician Assistant

## 2021-10-28 ENCOUNTER — Other Ambulatory Visit (HOSPITAL_BASED_OUTPATIENT_CLINIC_OR_DEPARTMENT_OTHER): Payer: Self-pay

## 2021-10-28 MED ORDER — DOXAZOSIN MESYLATE 4 MG PO TABS
ORAL_TABLET | ORAL | 3 refills | Status: DC
  Filled 2021-10-28: qty 90, 90d supply, fill #0
  Filled 2022-01-26: qty 90, 90d supply, fill #1
  Filled 2022-04-23: qty 90, 90d supply, fill #2
  Filled 2022-07-20: qty 90, 90d supply, fill #3

## 2021-10-29 ENCOUNTER — Telehealth: Payer: Self-pay

## 2021-10-29 ENCOUNTER — Other Ambulatory Visit (HOSPITAL_BASED_OUTPATIENT_CLINIC_OR_DEPARTMENT_OTHER): Payer: Self-pay

## 2021-10-29 MED ORDER — AMOXICILLIN-POT CLAVULANATE 875-125 MG PO TABS
1.0000 | ORAL_TABLET | Freq: Two times a day (BID) | ORAL | 0 refills | Status: DC
  Filled 2021-10-29: qty 20, 10d supply, fill #0

## 2021-10-29 NOTE — Telephone Encounter (Signed)
Per nurse Eustaquio Maize, patient's nurse Maudie Mercury is aware of appointment as well.

## 2021-10-29 NOTE — Telephone Encounter (Signed)
Left message for patient's nurse Maudie Mercury to return call in regards to upcoming follow up.

## 2021-10-29 NOTE — Telephone Encounter (Signed)
Patient called me directly re: ongoing abdominal pain issues. No fever.  LLQ pain the greatest w/ some right and epigastric pain.  Chronic constipation.  We have decided to treat w/ Augmentin  I explained that he has f/u me 9/6 at 950 AM - he is aware

## 2021-10-30 ENCOUNTER — Other Ambulatory Visit (HOSPITAL_BASED_OUTPATIENT_CLINIC_OR_DEPARTMENT_OTHER): Payer: Self-pay

## 2021-10-30 DIAGNOSIS — L570 Actinic keratosis: Secondary | ICD-10-CM | POA: Diagnosis not present

## 2021-10-30 DIAGNOSIS — L578 Other skin changes due to chronic exposure to nonionizing radiation: Secondary | ICD-10-CM | POA: Diagnosis not present

## 2021-10-30 DIAGNOSIS — D0439 Carcinoma in situ of skin of other parts of face: Secondary | ICD-10-CM | POA: Diagnosis not present

## 2021-10-30 MED ORDER — VALACYCLOVIR HCL 500 MG PO TABS
ORAL_TABLET | ORAL | 0 refills | Status: DC
  Filled 2021-10-30: qty 14, 7d supply, fill #0

## 2021-11-04 ENCOUNTER — Other Ambulatory Visit (HOSPITAL_BASED_OUTPATIENT_CLINIC_OR_DEPARTMENT_OTHER): Payer: Self-pay

## 2021-11-04 ENCOUNTER — Encounter: Payer: Self-pay | Admitting: Internal Medicine

## 2021-11-04 ENCOUNTER — Ambulatory Visit (INDEPENDENT_AMBULATORY_CARE_PROVIDER_SITE_OTHER): Payer: Medicare Other | Admitting: Internal Medicine

## 2021-11-04 VITALS — BP 102/62 | HR 65 | Ht 73.0 in | Wt 220.0 lb

## 2021-11-04 DIAGNOSIS — T402X5A Adverse effect of other opioids, initial encounter: Secondary | ICD-10-CM

## 2021-11-04 DIAGNOSIS — G8929 Other chronic pain: Secondary | ICD-10-CM

## 2021-11-04 DIAGNOSIS — R066 Hiccough: Secondary | ICD-10-CM | POA: Diagnosis not present

## 2021-11-04 DIAGNOSIS — K5903 Drug induced constipation: Secondary | ICD-10-CM

## 2021-11-04 DIAGNOSIS — G909 Disorder of the autonomic nervous system, unspecified: Secondary | ICD-10-CM | POA: Diagnosis not present

## 2021-11-04 DIAGNOSIS — R1013 Epigastric pain: Secondary | ICD-10-CM

## 2021-11-04 DIAGNOSIS — R1032 Left lower quadrant pain: Secondary | ICD-10-CM

## 2021-11-04 DIAGNOSIS — B370 Candidal stomatitis: Secondary | ICD-10-CM

## 2021-11-04 DIAGNOSIS — K5792 Diverticulitis of intestine, part unspecified, without perforation or abscess without bleeding: Secondary | ICD-10-CM

## 2021-11-04 MED ORDER — NYSTATIN 100000 UNIT/ML MT SUSP
5.0000 mL | Freq: Four times a day (QID) | OROMUCOSAL | 1 refills | Status: DC
Start: 2021-11-04 — End: 2022-02-16
  Filled 2021-11-09: qty 60, 3d supply, fill #0

## 2021-11-04 MED ORDER — NALOXEGOL OXALATE 12.5 MG PO TABS
12.5000 mg | ORAL_TABLET | Freq: Every day | ORAL | 0 refills | Status: DC
Start: 2021-11-04 — End: 2021-11-25
  Filled 2021-11-04 (×3): qty 30, 30d supply, fill #0

## 2021-11-04 NOTE — Progress Notes (Signed)
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   Diverticulitis sigmoid colon recurrent Yes   Dyspepsia    Oral thrush - resolved    Constipation due to opioid therapy    Chronic LLQ pain + tenderness    Autonomic dysfunction    Hiccups     He is better with respect to suspected recurrent diverticulitis, after Augmentin and some prior amoxicillin a total of about 7 days of antibiotics.  We will hold on further antibiotics for that at this time.  Nystatin swish and swallow prescription on hand in case he has recurrent oral thrush.  Breath-hold technique with inhalation and holding breath on a few successive building breaths to see if that helps recurrent hiccups.  Trial of Movantik 12.5 mg tablets start with a half tab daily to see if that helps constipation problems which are at least in part attributable to chronic opioid therapy.  May continue MiraLAX intermittent stimulant laxatives if needed.  Chronic opioid therapy could be contributing to upper abdominal symptoms as well.  Suspect some of his symptoms particularly the dyspepsia may be related in part to known autonomic dysfunction issues.   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  We spoke about this last week and decided to try Augmentin.  He saw my colleague Dr. Fuller Plan on June 8 this year and a CT scan was ordered (unrevealing) and showed diverticulosis but no diverticulitis, impression pasted  below.  He is seen with his personal nurse, Erlene Quan.  He had called me last week with an uptick in belching and hiccups, also having left lower quadrant pain that he characterized as his diverticulitis pain.  There were some chills and a transient fever also.  See the phone note about that.  Flare began 10 days ago.  He had self started on some amoxicillin and at the time he called me we decided to proceed with Augmentin prescription.  He had a total of about 7 days of antibiotics between the amoxicillin and Augmentin and he stopped that the other day and says that that pain is back to his baseline discomfort with chronic left lower quadrant tenderness.  He has been on a liquid and then very soft diet as well and gradually increasing that.    For the past several months, he describes frequent belching and burping issues when eating which is socially unacceptable, he also has had some upper abdominal discomfort and nausea, postprandial mostly.  Visit done is no anemia is no ischemic changes there and all systems cannot Ampligen off well wanted to be is not me my in my instinct is there is nothing bad going on but although there is certainly no ischemic changes there, compared yeah just let Dr.  Also having early satiety issues at times.  He tried some extra Protonix and some over-the-counter Zegerid for a few weeks.  He has decreased carbonated beverages.  He says at this point he is 90% better, his nurse says 60% better.  Chronic constipation remains an  issue.  Also on chronic hydrocodone for chronic pain syndrome.  Treated with MiraLAX using it intermittently.  Peri-Colace twice daily.  Has a bowel movement every other day but it is a bit difficult with some straining.  Wt Readings from Last 3 Encounters:  11/04/21 220 lb (99.8 kg)  10/23/21 224 lb (101.6 kg)  10/07/21 221 lb (100.2 kg)  January 2023 226 pounds    Chemistry      Component Value Date/Time   NA 139 10/22/2021 0000   NA 141  04/05/2013 1159   K 4.1 10/22/2021 0000   K 3.8 04/05/2013 1159   CL 100 10/22/2021 0000   CO2 28 (A) 10/22/2021 0000   CO2 30 (H) 04/05/2013 1159   BUN 11 10/22/2021 0000   BUN 18.3 04/05/2013 1159   CREATININE 1.0 10/22/2021 0000   CREATININE 1.16 08/06/2021 1151   CREATININE 0.87 08/26/2020 1234   CREATININE 0.88 11/26/2013 1142   CREATININE 1.1 04/05/2013 1159   GLU 125 10/22/2021 0000      Component Value Date/Time   CALCIUM 9.2 10/22/2021 0000   CALCIUM 9.7 04/05/2013 1159   ALKPHOS 88 08/06/2021 1151   ALKPHOS 52 04/05/2013 1159   AST 21 08/06/2021 1151   AST 22 08/26/2020 1234   AST 19 04/05/2013 1159   ALT 26 08/06/2021 1151   ALT 19 08/26/2020 1234   ALT 16 04/05/2013 1159   BILITOT 0.6 08/06/2021 1151   BILITOT 0.5 08/26/2020 1234   BILITOT 0.45 04/05/2013 1159     Lab Results  Component Value Date   WBC 7.3 10/22/2021   HGB 11.0 (A) 10/22/2021   HCT 33 (A) 10/22/2021   MCV 83.8 08/06/2021   PLT 214 10/22/2021     08/12/2021 CT abdomen pelvis with contrast (premedicated) IMPRESSION: 1. No acute CT findings of the abdomen or pelvis to explain lower abdominal pain. 2. Descending and sigmoid diverticulosis without evidence of acute diverticulitis. 3. Severe prostatomegaly. 4. Tiny gallstones and or sludge in the gallbladder. No evidence of acute cholecystitis. 5. Coronary artery disease   CT colonoscopy 2004 6 mm polyp and diverticulosis Allergies  Allergen Reactions   Contrast Media [Iodinated Contrast Media] Hives   Gadolinium Derivatives Hives   Iodine-131 Hives   Metrizamide Hives   Tetanus Toxoids    Current Meds  Medication Sig   amLODipine (NORVASC) 2.5 MG tablet Take 1 tablet (2.5 mg total) by mouth at bedtime.   apixaban (ELIQUIS) 5 MG TABS tablet TAKE 1 TABLET BY MOUTH 2 TIMES DAILY. (Patient taking differently: Take 5 mg by mouth 2 (two) times daily.)   butalbital-acetaminophen-caffeine (FIORICET) 50-325-40 MG tablet Take 1 tablet by  mouth twice daily as needed for headache.   Cholecalciferol (VITAMIN D3) 2000 units capsule Take 2,000 Units by mouth daily.   cilostazol (PLETAL) 100 MG tablet Take 1 tablet (100 mg total) by mouth 2 (two) times daily.   Coenzyme Q10 200 MG TABS Take 200 mg by mouth daily.   desoximetasone (TOPICORT) 0.25 % cream Apply 1 application topically 2 (two) times daily. (Patient taking differently: Apply 1 application  topically 2 (two) times daily as needed (rash or irritation).)   diclofenac Sodium (VOLTAREN) 1 % GEL Apply 2 g topically 2 (two) times daily as needed (knee pain).   digoxin (LANOXIN) 0.125 MG tablet Take 1 tablet (0.125 mg total) by mouth daily. (Patient taking differently: Take 0.125 mg by mouth daily. Not taking it right now due to Gynecomastia)   docusate sodium (  COLACE) 100 MG capsule Take 100 mg by mouth 2 (two) times daily.   doxazosin (CARDURA) 4 MG tablet 1 tablet PO QD   ezetimibe (ZETIA) 10 MG tablet Take 1 tablet (10 mg total) by mouth daily.   fluorouracil (EFUDEX) 5 % cream 1 application Externally Once a day 21 days   furosemide (LASIX) 20 MG tablet Take 1 tablet (20 mg total) by mouth 3 (three) times a week.   HYDROcodone-acetaminophen (NORCO) 7.5-325 MG tablet Take 1 tablet by mouth 3 (three) times daily.   LORazepam (ATIVAN) 1 MG tablet TAKE 1 TABLET BY MOUTH AT BEDTIME. MAY ALSO TAKE 1/2 TABLET DAILY AS NEEDED FOR ANXIETY.   magnesium oxide (MAG-OX) 400 MG tablet Take 400 mg by mouth daily.   metoprolol succinate (TOPROL-XL) 50 MG 24 hr tablet Take 1 tablet (50 mg total) by mouth 2 (two) times daily.   mometasone (ELOCON) 0.1 % cream Apply 1 application topically daily. (Patient taking differently: Apply 1 application  topically daily as needed (rash or irritation).)   mometasone (ELOCON) 0.1 % cream 1 application Externally Twice a day 14 days   Multiple Vitamins-Minerals (MULTIVITAMIN WITH MINERALS) tablet Take 1 tablet by mouth daily.   nitroGLYCERIN (NITROSTAT) 0.4  MG SL tablet PLACE 1 TABLET UNDER THE TONGUE EVERY 5 MINUTES AS NEEDED FOR CHEST PAIN.   pantoprazole (PROTONIX) 40 MG tablet TAKE 1 TABLET BY MOUTH DAILY.   polyethylene glycol (MIRALAX / GLYCOLAX) 17 g packet Take 17 g by mouth daily.   potassium chloride SA (KLOR-CON M) 20 MEQ tablet TAKE 1 TABLET BY MOUTH DAILY. (Patient taking differently: Take 20 mEq by mouth daily.)   predniSONE (DELTASONE) 10 MG tablet Take 1 tablet (10 mg total) by mouth daily with breakfast. (Patient taking differently: Take 5 mg by mouth daily with breakfast. Continuous)   rosuvastatin (CRESTOR) 20 MG tablet TAKE 1 TABLET (20 MG TOTAL) BY MOUTH AT BEDTIME. (Patient taking differently: Take 20 mg by mouth at bedtime.)   valACYclovir (VALTREX) 500 MG tablet Take 1 tablet twice a day for 7 days   Past Medical History:  Diagnosis Date   Aortic stenosis    moderate aortic stenosis   Arthritis    Benign prostatic hypertrophy    Chronic systolic heart failure (HCC)    Chronotropic incompetence with sinus node dysfunction (HCC)    Status post Guidant dual-mode, dual-pacing, dual-sensing  pacemaker   implantation now programmed to AAI with recent generator change.   Coronary artery disease    status post multiple prior percutaneous coronary interventions, microvascular angina per Dr Olevia Perches   Diverticulitis sigmoid colon recurrent    Dysfunctional autonomic nervous system    Dyspnea    Heart murmur    History of primary hypertension    Hypercoagulable state ([place])    chronically anticoagulated with coumadin   Hyperlipidemia    Hyperthyroidism    Hypothyroidism    Dr. 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 Lia Foyer,    Prediabetes 09/21/2017   Stroke ([place])    1990   Past Surgical History:  Procedure Laterality Date   AORTIC VALVE REPLACEMENT  03/15/2011   Procedure: AORTIC VALVE  REPLACEMENT (AVR);  Surgeon: Gaye Pollack, MD;  Location: [facility];  Service: Open Heart Surgery;  Laterality: N/A;   APPENDECTOMY     CARDIAC CATHETERIZATION     11  CARDIOVERSION     CARDIOVERSION  04/15/2011   Procedure: CARDIOVERSION;  Surgeon: Loralie Champagne, MD;  Location: [facility];  Service: Cardiovascular;  Laterality: N/A;   CARDIOVERSION N/A 09/11/2014   Procedure: CARDIOVERSION;  Surgeon: Sueanne Margarita, MD;  Location: [place] [facility] ENDOSCOPY;  Service: Cardiovascular;  Laterality: N/A;   CARDIOVERSION N/A 06/27/2015   Procedure: CARDIOVERSION;  Surgeon: Thayer Headings, MD;  Location: [facility];  Service: Cardiovascular;  Laterality: N/A;   CARDIOVERSION N/A 07/04/2015   Procedure: CARDIOVERSION;  Surgeon: Evans Lance, MD;  Location: [facility];  Service: Cardiovascular;  Laterality: N/A;   CARDIOVERSION N/A 04/13/2017   Procedure: CARDIOVERSION;  Surgeon: Sueanne Margarita, MD;  Location[facility];  Service: Cardiovascular;  Laterality: N/A;   COLONOSCOPY     CORONARY STENT INTERVENTION N/A 10/15/2019   Procedure: CORONARY STENT INTERVENTION;  Surgeon: Jettie Booze, MD;  Location: [place] CV LAB;  Service: Cardiovascular;  Laterality: N/A;   EP IMPLANTABLE DEVICE N/A 06/16/2015   Procedure: Pacemaker Implant;  Surgeon: Evans Lance, MD;  Location: [place] CV LAB;  Service: Cardiovascular;  Laterality: N/A;   hemrrhoidectomy     LEFT AND RIGHT HEART CATHETERIZATION WITH CORONARY ANGIOGRAM Bilateral 02/01/2011   Procedure: LEFT AND RIGHT HEART CATHETERIZATION WITH CORONARY ANGIOGRAM;  Surgeon: Hillary Bow, MD;  Location: [facility] CATH LAB;  Service: Cardiovascular;  Laterality: Bilateral;   LEFT HEART CATH AND CORONARY ANGIOGRAPHY N/A 10/15/2019   Procedure: LEFT HEART CATH AND CORONARY ANGIOGRAPHY;  Surgeon: Jettie Booze, MD;  Location: [place] CV LAB;  Service: Cardiovascular;  Laterality: N/A;   LEFT HEART CATH AND CORONARY ANGIOGRAPHY N/A  10/07/2020   Procedure: LEFT HEART CATH AND CORONARY ANGIOGRAPHY;  Surgeon: Sherren Mocha, MD;  Location: [place] CV LAB;  Service: Cardiovascular;  Laterality: N/A;   LUMBAR LAMINECTOMY/DECOMPRESSION MICRODISCECTOMY Right 02/23/2021   Procedure: Right Lumbar four-five microdiscectomy;  Surgeon: Eustace Moore, MD;  Location: [place];  Service: Neurosurgery;  Laterality: Right;   MAZE  03/15/2011   Procedure: MAZE;  Surgeon: Gaye Pollack, MD;  Location: [facility];  Service: Open Heart Surgery;  Laterality: N/A;   MOHS SURGERY  09/2021   PACEMAKER INSERTION  1991   Guidant PPM, most recent Generator Change by Dr Olevia Perches was 08/22/06   RIGHT/LEFT HEART CATH AND CORONARY ANGIOGRAPHY N/A 07/06/2016   Procedure: Right/Left Heart Cath and Coronary Angiography;  Surgeon: Sherren Mocha, MD;  Location: [place] CV LAB;  Service: Cardiovascular;  Laterality: N/A;   TEE WITHOUT CARDIOVERSION  04/15/2011   Procedure: TRANSESOPHAGEAL ECHOCARDIOGRAM (TEE);  Surgeon: Loralie Champagne, MD;  Location: [facility];  Service: Cardiovascular;  Laterality: N/A;   TEE WITHOUT CARDIOVERSION N/A 09/11/2014   Procedure: TRANSESOPHAGEAL ECHOCARDIOGRAM (TEE);  Surgeon: Sueanne Margarita, MD;  Location: [facility] ENDOSCOPY;  Service: Cardiovascular;  Laterality: N/A;   Social History   Social History Narrative   Married to [place]. Has grown children   Retired Horticulturist, commercial MD      Never smoker no alcohol      family history includes Anorexia nervosa in his daughter; Atrial fibrillation in his brother; CAD in his brother; Depression in his daughter; Heart disease in his brother; Hypertension in his son; Sarcoidosis in his brother.   Review of Systems Recent Mohs surgery on his lip.  Was prescribed Valtrex for unclear reasons.  Objective:   Physical Exam BP 102/62   Pulse 65   Ht '6\' 1"'$  (1.854 m)   Wt 220 lb (99.8  kg)   SpO2 96%   BMI 29.03 kg/m  Elderly wm NAD Abd soft, mildly tender LLQ, no mass BS +, otherwise  negative

## 2021-11-04 NOTE — Patient Instructions (Addendum)
We have sent the following medications to your pharmacy for you to pick up at your convenience: Movantik : start with 1/2 tablet and move up to 1 tablet as tolerated.  Fill the nystatin Rx if you need it for thrush. Printed Rx given today. ______________________________________________________  If you are age 85 or older, your body mass index should be between 23-30. Your Body mass index is 29.03 kg/m. If this is out of the aforementioned range listed, please consider follow up with your Primary Care Provider.  ________________________________________________________  The [facility] providers would like to encourage you to use [facility] to communicate with providers for non-urgent requests or questions.  Due to long hold times on the telephone, sending your provider a message by [facility] may be a faster and more efficient way to get a response.  Please allow 48 business hours for a response.  Please remember that this is for non-urgent requests.  _______________________________________________________  I appreciate the opportunity to care for you. Silvano Rusk, MD, [facility]

## 2021-11-06 ENCOUNTER — Other Ambulatory Visit (HOSPITAL_BASED_OUTPATIENT_CLINIC_OR_DEPARTMENT_OTHER): Payer: Self-pay

## 2021-11-08 ENCOUNTER — Encounter: Payer: Self-pay | Admitting: Internal Medicine

## 2021-11-09 ENCOUNTER — Encounter: Payer: Self-pay | Admitting: Adult Health

## 2021-11-09 ENCOUNTER — Other Ambulatory Visit (HOSPITAL_BASED_OUTPATIENT_CLINIC_OR_DEPARTMENT_OTHER): Payer: Self-pay

## 2021-11-09 ENCOUNTER — Non-Acute Institutional Stay: Payer: Medicare Other | Admitting: Adult Health

## 2021-11-09 VITALS — BP 140/88 | HR 75 | Temp 97.5°F | Ht 73.0 in | Wt 218.6 lb

## 2021-11-09 DIAGNOSIS — R5381 Other malaise: Secondary | ICD-10-CM

## 2021-11-09 DIAGNOSIS — R300 Dysuria: Secondary | ICD-10-CM

## 2021-11-09 DIAGNOSIS — I25119 Atherosclerotic heart disease of native coronary artery with unspecified angina pectoris: Secondary | ICD-10-CM | POA: Diagnosis not present

## 2021-11-09 NOTE — Progress Notes (Signed)
Location:  [facility]  POS: Clinic  Provider: Royal Hawthorn, ANP   Goals of Care:     04/20/2021    9:24 AM  Advanced Directives  Does Patient Have a Medical Advance Directive? Yes  Type of Advance Directive Living will  Does patient want to make changes to medical advance directive? No - Guardian declined     Chief Complaint  Patient presents with   Acute Visit    Complains of Dysuria, fatigue and body aches for the last week. Urine obtained in clinic.     HPI: Patient is a 87 y.o. [name] seen today for dysuria.    Has had two days of dysuria and frequency. Low grade temps, general malaise, chills.   No flank pain, a little nauseous. No hematuria or purulent urine.  Low appetite, drinking enough fluids  CT of the abd 6/13/23showed enlarged prostate. Diverticulosis and galls stones noted.  Denies any feelings of retention and not emptying. Nurse with him reports chronic retention with residual 100-200 cc.   He did report a low bp this morning in the 70s. This is not unusual as he has a hx of orthostatic bp. Now resolved. BP 140/88. Has nurse with him and knows to drink fluids, rise slowly.   Of note he was treated for diverticulitis with Augumentin by Dr Carlean Purl. Seen 9/6 and improving. No abd pain, bloating or blood in stool.   Past Medical History:  Diagnosis Date   Aortic stenosis    moderate aortic stenosis   Arthritis    Benign prostatic hypertrophy    Chronic systolic heart failure (HCC)    Chronotropic incompetence with sinus node dysfunction (HCC)    Status post Guidant dual-mode, dual-pacing, dual-sensing  pacemaker   implantation now programmed to AAI with recent generator change.   Coronary artery disease    status post multiple prior percutaneous coronary interventions, microvascular angina per Dr Olevia Perches   Diverticulitis sigmoid colon recurrent    Dysfunctional autonomic nervous system    Dyspnea    Heart murmur    History of primary hypertension     Hypercoagulable state ([place])    chronically anticoagulated with coumadin   Hyperlipidemia    Hyperthyroidism    Hypothyroidism    Dr. 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 Lia Foyer,    Prediabetes 09/21/2017   Stroke ([place])    1990    Past Surgical History:  Procedure Laterality Date   AORTIC VALVE REPLACEMENT  03/15/2011   Procedure: AORTIC VALVE REPLACEMENT (AVR);  Surgeon: Gaye Pollack, MD;  Location: [facility];  Service: Open Heart Surgery;  Laterality: N/A;   APPENDECTOMY     CARDIAC CATHETERIZATION     11   CARDIOVERSION     CARDIOVERSION  04/15/2011   Procedure: CARDIOVERSION;  Surgeon: Loralie Champagne, MD;  Location: [place];  Service: Cardiovascular;  Laterality: N/A;   CARDIOVERSION N/A 09/11/2014   Procedure: CARDIOVERSION;  Surgeon: Sueanne Margarita, MD;  Location: [facility];  Service: Cardiovascular;  Laterality: N/A;   CARDIOVERSION N/A 06/27/2015   Procedure: CARDIOVERSION;  Surgeon: Thayer Headings, MD;  Location: [facility];  Service: Cardiovascular;  Laterality: N/A;   CARDIOVERSION N/A 07/04/2015   Procedure: CARDIOVERSION;  Surgeon: Evans Lance, MD;  Location: [place];  Service: Cardiovascular;  Laterality: N/A;   CARDIOVERSION N/A 04/13/2017   Procedure: CARDIOVERSION;  Surgeon: Sueanne Margarita, MD;  Location: [facility];  Service: Cardiovascular;  Laterality: N/A;   COLONOSCOPY     CORONARY STENT INTERVENTION N/A 10/15/2019   Procedure: CORONARY STENT INTERVENTION;  Surgeon: Jettie Booze, MD;  Location: [place];  Service: Cardiovascular;  Laterality: N/A;   EP IMPLANTABLE DEVICE N/A 06/16/2015   Procedure: Pacemaker Implant;  Surgeon: Evans Lance, MD;  Location: [place] [facility];  Service: Cardiovascular;  Laterality: N/A;   hemrrhoidectomy     LEFT AND RIGHT HEART CATHETERIZATION WITH CORONARY  ANGIOGRAM Bilateral 02/01/2011   Procedure: LEFT AND RIGHT HEART CATHETERIZATION WITH CORONARY ANGIOGRAM;  Surgeon: Hillary Bow, MD;  Location: [facility] CATH LAB;  Service: Cardiovascular;  Laterality: Bilateral;   LEFT HEART CATH AND CORONARY ANGIOGRAPHY N/A 10/15/2019   Procedure: LEFT HEART CATH AND CORONARY ANGIOGRAPHY;  Surgeon: Jettie Booze, MD;  Location: [facility];  Service: Cardiovascular;  Laterality: N/A;   LEFT HEART CATH AND CORONARY ANGIOGRAPHY N/A 10/07/2020   Procedure: LEFT HEART CATH AND CORONARY ANGIOGRAPHY;  Surgeon: Sherren Mocha, MD;  Location: [place] [facility];  Service: Cardiovascular;  Laterality: N/A;   LUMBAR LAMINECTOMY/DECOMPRESSION MICRODISCECTOMY Right 02/23/2021   Procedure: Right Lumbar four-five microdiscectomy;  Surgeon: Eustace Moore, MD;  Location: [place];  Service: Neurosurgery;  Laterality: Right;   MAZE  03/15/2011   Procedure: MAZE;  Surgeon: Gaye Pollack, MD;  Location: [place];  Service: Open Heart Surgery;  Laterality: N/A;   MOHS SURGERY  09/2021   PACEMAKER INSERTION  1991   Guidant PPM, most recent Generator Change by Dr Olevia Perches was 08/22/06   RIGHT/LEFT HEART CATH AND CORONARY ANGIOGRAPHY N/A 07/06/2016   Procedure: Right/Left Heart Cath and Coronary Angiography;  Surgeon: Sherren Mocha, MD;  Location: [place] [facility];  Service: Cardiovascular;  Laterality: N/A;   TEE WITHOUT CARDIOVERSION  04/15/2011   Procedure: TRANSESOPHAGEAL ECHOCARDIOGRAM (TEE);  Surgeon: Loralie Champagne, MD;  Location: [place];  Service: Cardiovascular;  Laterality: N/A;   TEE WITHOUT CARDIOVERSION N/A 09/11/2014   Procedure: TRANSESOPHAGEAL ECHOCARDIOGRAM (TEE);  Surgeon: Sueanne Margarita, MD;  Location: [facility] ENDOSCOPY;  Service: Cardiovascular;  Laterality: N/A;    Allergies  Allergen Reactions   Contrast Media [Iodinated Contrast Media] Hives   Gadolinium Derivatives Hives   Iodine-131 Hives   Metrizamide Hives   Tetanus Toxoids      Outpatient Encounter Medications as of 11/09/2021  Medication Sig   apixaban (ELIQUIS) 5 MG TABS tablet TAKE 1 TABLET BY MOUTH 2 TIMES DAILY. (Patient taking differently: Take 5 mg by mouth 2 (two) times daily.)   Cholecalciferol (VITAMIN D3) 2000 units capsule Take 2,000 Units by mouth daily.   cilostazol (PLETAL) 100 MG tablet Take 1 tablet (100 mg total) by mouth 2 (two) times daily.   Coenzyme Q10 200 MG TABS Take 200 mg by mouth daily.   desoximetasone (TOPICORT) 0.25 % cream Apply 1 application topically 2 (two) times daily. (Patient taking differently: Apply 1 application  topically 2 (two) times daily as needed (rash or irritation).)   diclofenac Sodium (VOLTAREN) 1 % GEL Apply 2 g topically 2 (two) times daily as needed (knee pain).   digoxin (LANOXIN) 0.125 MG tablet Take 1 tablet (0.125 mg total) by mouth daily. (Patient taking differently: Take 0.125 mg by mouth daily. Not taking it right now due to Gynecomastia)   docusate sodium (COLACE) 100 MG capsule Take 100 mg by mouth 2 (two) times daily.   doxazosin (  CARDURA) 4 MG tablet 1 tablet PO QD   ezetimibe (ZETIA) 10 MG tablet Take 1 tablet (10 mg total) by mouth daily.   fluorouracil (EFUDEX) 5 % cream 1 application Externally Once a day 21 days   HYDROcodone-acetaminophen (NORCO) 7.5-325 MG tablet Take 1 tablet by mouth 3 (three) times daily.   LORazepam (ATIVAN) 1 MG tablet TAKE 1 TABLET BY MOUTH AT BEDTIME. MAY ALSO TAKE 1/2 TABLET DAILY AS NEEDED FOR ANXIETY.   magnesium oxide (MAG-OX) 400 MG tablet Take 400 mg by mouth daily.   metoprolol succinate (TOPROL-XL) 50 MG 24 hr tablet Take 1 tablet (50 mg total) by mouth 2 (two) times daily.   mometasone (ELOCON) 0.1 % cream Apply 1 application topically daily. (Patient taking differently: Apply 1 application  topically daily as needed (rash or irritation).)   Multiple Vitamins-Minerals (MULTIVITAMIN WITH MINERALS) tablet Take 1 tablet by mouth daily.   naloxegol oxalate (MOVANTIK)  12.5 MG TABS tablet Take 1 tablet (12.5 mg total) by mouth daily.   nitroGLYCERIN (NITROSTAT) 0.4 MG SL tablet PLACE 1 TABLET UNDER THE TONGUE EVERY 5 MINUTES AS NEEDED FOR CHEST PAIN.   nystatin (MYCOSTATIN) 100000 UNIT/ML suspension Take 5 mLs (500,000 Units total) by mouth 4 (four) times daily.   pantoprazole (PROTONIX) 40 MG tablet TAKE 1 TABLET BY MOUTH DAILY. (Patient taking differently: Take by mouth 2 (two) times daily.)   polyethylene glycol (MIRALAX / GLYCOLAX) 17 g packet Take 17 g by mouth daily.   potassium chloride SA (KLOR-CON M) 20 MEQ tablet TAKE 1 TABLET BY MOUTH DAILY. (Patient taking differently: Take 20 mEq by mouth daily.)   predniSONE (DELTASONE) 10 MG tablet Take 1 tablet (10 mg total) by mouth daily with breakfast. (Patient taking differently: Take 5 mg by mouth daily with breakfast. Continuous)   rosuvastatin (CRESTOR) 20 MG tablet TAKE 1 TABLET (20 MG TOTAL) BY MOUTH AT BEDTIME. (Patient taking differently: Take 20 mg by mouth at bedtime.)   valACYclovir (VALTREX) 500 MG tablet Take 1 tablet twice a day for 7 days   amLODipine (NORVASC) 2.5 MG tablet Take 1 tablet (2.5 mg total) by mouth at bedtime. (Patient not taking: Reported on 11/09/2021)   butalbital-acetaminophen-caffeine (FIORICET) 50-325-40 MG tablet Take 1 tablet by mouth twice daily as needed for headache. (Patient not taking: Reported on 11/09/2021)   furosemide (LASIX) 20 MG tablet Take 1 tablet (20 mg total) by mouth 3 (three) times a week. (Patient not taking: Reported on 11/09/2021)   [DISCONTINUED] amoxicillin-clavulanate (AUGMENTIN) 875-125 MG tablet Take 1 tablet by mouth 2 (two) times daily. (Patient not taking: Reported on 11/04/2021)   [DISCONTINUED] mometasone (ELOCON) 0.1 % cream 1 application Externally Twice a day 14 days   [DISCONTINUED] pregabalin (LYRICA) 75 MG capsule Take 75 mg by mouth 2 (two) times daily.   No facility-administered encounter medications on file as of 11/09/2021.    Review of  Systems:  Review of Systems  Constitutional:  Positive for appetite change, fatigue and fever. Negative for activity change, chills, diaphoresis and unexpected weight change.  Respiratory:  Negative for cough, shortness of breath, wheezing and stridor.   Cardiovascular:  Negative for chest pain, palpitations and leg swelling.  Gastrointestinal:  Negative for abdominal distention, abdominal pain, constipation and diarrhea.  Genitourinary:  Positive for dysuria and frequency. Negative for decreased urine volume, difficulty urinating, flank pain, hematuria, penile discharge, penile pain and urgency.  Musculoskeletal:  Negative for arthralgias, back pain, gait problem, joint swelling and myalgias.  Neurological:  Negative for dizziness, seizures, syncope, facial asymmetry, speech difficulty, weakness and headaches.  Hematological:  Negative for adenopathy. Does not bruise/bleed easily.  Psychiatric/Behavioral:  Negative for agitation, behavioral problems and confusion.     Health Maintenance  Topic Date Due   TETANUS/TDAP  Never done   Zoster Vaccines- Shingrix (1 of 2) Never done   Pneumonia Vaccine 64+ Years old (1 - PCV) Never done   COVID-19 Vaccine (4 - Pfizer risk series) 12/27/2019   INFLUENZA VACCINE  09/29/2021   HPV VACCINES  Aged Out    Physical Exam: Vitals:   11/09/21 1517  BP: (!) 140/88  Pulse: 75  Temp: (!) 97.5 F (36.4 C)  SpO2: 96%  Weight: 218 lb 9.6 oz (99.2 kg)  Height: '6\' 1"'$  (1.854 m)   Body mass index is 28.84 kg/m. Physical Exam Vitals and nursing note reviewed.  Constitutional:      General: He is not in acute distress.    Appearance: He is not diaphoretic.  HENT:     Head: Normocephalic and atraumatic.  Neck:     Thyroid: No thyromegaly.     Vascular: No JVD.     Trachea: No tracheal deviation.  Cardiovascular:     Rate and Rhythm: Normal rate and regular rhythm.     Heart sounds: No murmur heard. Pulmonary:     Effort: Pulmonary effort is  normal. No respiratory distress.     Breath sounds: Normal breath sounds. No wheezing.  Abdominal:     General: Bowel sounds are normal. There is no distension.     Palpations: Abdomen is soft.     Tenderness: There is no abdominal tenderness. There is no right CVA tenderness, left CVA tenderness or guarding.  Lymphadenopathy:     Cervical: No cervical adenopathy.  Skin:    General: Skin is warm and dry.     Coloration: Skin is pale.     Findings: Rash (maculopapular rash to nose) present.  Neurological:     Mental Status: He is alert and oriented to person, place, and time.  Psychiatric:        Mood and Affect: Mood normal.     Labs reviewed: Basic Metabolic Panel: Recent Labs    04/12/21 1441 04/13/21 0109 08/06/21 1151 10/22/21 0000  NA 139 136 140 139  K 4.4 3.9 4.3 4.1  CL 103 103 100 100  CO2  --  23 31 28*  GLUCOSE 195* 204* 130*  --   BUN '16 15 22 11  '$ CREATININE 1.00 1.03 1.16 1.0  CALCIUM  --  8.9 9.6 9.2   Liver Function Tests: Recent Labs    04/12/21 1429 08/06/21 1151  AST 26 21  ALT 19 26  ALKPHOS 82 88  BILITOT 0.6 0.6  PROT 7.5 7.2  7.5  ALBUMIN 4.0 4.1   No results for input(s): "LIPASE", "AMYLASE" in the last 8760 hours. No results for input(s): "AMMONIA" in the last 8760 hours. CBC: Recent Labs    04/12/21 1429 04/12/21 1441 04/13/21 0109 08/06/21 1151 10/22/21 0000  WBC 10.5  --  8.8 13.4* 7.3  NEUTROABS 9.5*  --  8.2* 11.2*  --   HGB 11.2*   < > 10.3* 11.6* 11.0*  HCT 36.6*   < > 33.2* 35.6* 33*  MCV 91.0  --  89.5 83.8  --   PLT 212  --  209 225.0 214   < > = values in this interval not displayed.   Lipid Panel: Recent  Labs    04/13/21 0109  CHOL 131  HDL 62  LDLCALC 64  TRIG 27  CHOLHDL 2.1   Lab Results  Component Value Date   HGBA1C 7.4 10/22/2021    Procedures since last visit: No results found.  Assessment/Plan  1. Dysuria Check UA C and S Encourage oral fluid  2. Malaise Check labs  ?UTI   Labs/tests ordered:  * No order type specified *CBC and BMP in am  Next appt:  11/18/2021   Total time 47mn:  time greater than 50% of total time spent doing pt counseling and coordination of care

## 2021-11-10 DIAGNOSIS — R300 Dysuria: Secondary | ICD-10-CM | POA: Diagnosis not present

## 2021-11-10 DIAGNOSIS — R5381 Other malaise: Secondary | ICD-10-CM | POA: Diagnosis not present

## 2021-11-10 LAB — CBC: RBC: 3.91 (ref 3.87–5.11)

## 2021-11-10 LAB — COMPREHENSIVE METABOLIC PANEL
Calcium: 9.3 (ref 8.7–10.7)
eGFR: 75

## 2021-11-10 LAB — BASIC METABOLIC PANEL
CO2: 28 — AB (ref 13–22)
Chloride: 101 (ref 99–108)
Creatinine: 1 (ref 0.6–1.3)
Glucose: 119
Potassium: 4.3 mEq/L (ref 3.5–5.1)
Sodium: 143 (ref 137–147)

## 2021-11-10 LAB — CBC AND DIFFERENTIAL
HCT: 34 — AB (ref 41–53)
Hemoglobin: 11.3 — AB (ref 13.5–17.5)
Platelets: 278 10*3/uL (ref 150–400)
WBC: 9

## 2021-11-11 ENCOUNTER — Other Ambulatory Visit: Payer: Self-pay | Admitting: Internal Medicine

## 2021-11-11 ENCOUNTER — Telehealth: Payer: Self-pay | Admitting: Adult Health

## 2021-11-11 ENCOUNTER — Other Ambulatory Visit (HOSPITAL_BASED_OUTPATIENT_CLINIC_OR_DEPARTMENT_OTHER): Payer: Self-pay

## 2021-11-11 MED ORDER — HYDROCODONE-ACETAMINOPHEN 7.5-325 MG PO TABS
1.0000 | ORAL_TABLET | Freq: Three times a day (TID) | ORAL | 0 refills | Status: DC
  Filled 2021-11-11: qty 90, 30d supply, fill #0

## 2021-11-11 NOTE — Telephone Encounter (Signed)
Pharmacy requested refill.  Epic LR: 10/07/2021  Pended Rx and sent to Dr. Lyndel Safe for approval.

## 2021-11-11 NOTE — Telephone Encounter (Signed)
Patient notified and agreed.  

## 2021-11-11 NOTE — Telephone Encounter (Signed)
Please call Dr. Velora Heckler and let him know that his UA did not show sign of infection. Also his white count and electrolytes are normal. If he continues to have issues with frequency or malaise let us know. Thanks.

## 2021-11-12 ENCOUNTER — Other Ambulatory Visit (HOSPITAL_BASED_OUTPATIENT_CLINIC_OR_DEPARTMENT_OTHER): Payer: Self-pay

## 2021-11-18 ENCOUNTER — Encounter: Payer: Self-pay | Admitting: Internal Medicine

## 2021-11-18 ENCOUNTER — Non-Acute Institutional Stay: Payer: Medicare Other | Admitting: Internal Medicine

## 2021-11-18 ENCOUNTER — Other Ambulatory Visit (HOSPITAL_BASED_OUTPATIENT_CLINIC_OR_DEPARTMENT_OTHER): Payer: Self-pay

## 2021-11-18 VITALS — BP 126/78 | HR 77 | Temp 97.5°F | Ht 73.0 in | Wt 220.0 lb

## 2021-11-18 DIAGNOSIS — K219 Gastro-esophageal reflux disease without esophagitis: Secondary | ICD-10-CM

## 2021-11-18 DIAGNOSIS — D649 Anemia, unspecified: Secondary | ICD-10-CM

## 2021-11-18 DIAGNOSIS — R600 Localized edema: Secondary | ICD-10-CM | POA: Diagnosis not present

## 2021-11-18 DIAGNOSIS — H34231 Retinal artery branch occlusion, right eye: Secondary | ICD-10-CM | POA: Diagnosis not present

## 2021-11-18 DIAGNOSIS — I25118 Atherosclerotic heart disease of native coronary artery with other forms of angina pectoris: Secondary | ICD-10-CM

## 2021-11-18 DIAGNOSIS — E1159 Type 2 diabetes mellitus with other circulatory complications: Secondary | ICD-10-CM

## 2021-11-18 DIAGNOSIS — Z9889 Other specified postprocedural states: Secondary | ICD-10-CM | POA: Diagnosis not present

## 2021-11-18 DIAGNOSIS — I4819 Other persistent atrial fibrillation: Secondary | ICD-10-CM

## 2021-11-18 DIAGNOSIS — I209 Angina pectoris, unspecified: Secondary | ICD-10-CM | POA: Diagnosis not present

## 2021-11-18 DIAGNOSIS — R42 Dizziness and giddiness: Secondary | ICD-10-CM

## 2021-11-18 DIAGNOSIS — N401 Enlarged prostate with lower urinary tract symptoms: Secondary | ICD-10-CM

## 2021-11-18 DIAGNOSIS — I10 Essential (primary) hypertension: Secondary | ICD-10-CM

## 2021-11-18 DIAGNOSIS — R300 Dysuria: Secondary | ICD-10-CM

## 2021-11-18 DIAGNOSIS — E785 Hyperlipidemia, unspecified: Secondary | ICD-10-CM | POA: Diagnosis not present

## 2021-11-18 DIAGNOSIS — M158 Other polyosteoarthritis: Secondary | ICD-10-CM | POA: Diagnosis not present

## 2021-11-19 ENCOUNTER — Other Ambulatory Visit (HOSPITAL_BASED_OUTPATIENT_CLINIC_OR_DEPARTMENT_OTHER): Payer: Self-pay

## 2021-11-19 MED ORDER — NITROGLYCERIN 0.4 MG SL SUBL
SUBLINGUAL_TABLET | SUBLINGUAL | 5 refills | Status: DC
  Filled 2021-11-19: qty 25, 5d supply, fill #0
  Filled 2022-01-18: qty 25, 5d supply, fill #1
  Filled 2022-03-15: qty 25, 5d supply, fill #2
  Filled 2022-06-23: qty 25, 5d supply, fill #3
  Filled 2022-10-20: qty 25, 5d supply, fill #4

## 2021-11-19 MED ORDER — MOMETASONE FUROATE 0.1 % EX CREA
1.0000 | TOPICAL_CREAM | Freq: Every day | CUTANEOUS | 0 refills | Status: DC
  Filled 2021-11-19: qty 15, 7d supply, fill #0

## 2021-11-20 NOTE — Progress Notes (Signed)
Location:  [place] of Service:  Clinic (12)  Provider:   Code Status:  Goals of Care:     04/20/2021    9:24 AM  Advanced Directives  Does Patient Have a Medical Advance Directive? Yes  Type of Advance Directive Living will  Does patient want to make changes to medical advance directive? No - Guardian declined     Chief Complaint  Patient presents with   Acute Visit    To discuss Labwork.     HPI: Patient is a 87 y.o. male seen today for medical management of chronic diseases.    Here to review labs No Acute complains Diabetes New Diagnosis A1C 7.4 He does not want any medications and want to modify his diet Dysuria UA was completly Normal No WBC  He thinks it can be any that her symptoms were due to prostatitis He is planning to follow-up with urology.  His symptoms are resolved at this time Lower extremity edema He did take Lasix but then the blood pressure became well and had to hold the amlodipine.  His nurse is going to manage Lasix as needed He is also taking amlodipine as needed Severe arthritis Pain seems to be controlled with Norco and prednisone Abdominal pain The CT was negative and pain is probably due to constipation so he is not taking MiraLAX and was just started on Movantik by Dr. Carlean Purl  Previous note Has h/o CAD, s/p Stent Distal RCA Stent Cath in 09/2019 Repeat Cath Done in 08/22 for Anginal Symptoms. Stents were open  Follows very Closely with Cardiology    H/o Atrial Flutter on Eliquis Due to high runs of A Flutter Digoxin was added  Sinus Node Dysfunction s/p PPM   Orthostatic Hypotension   Severe OsteoArthritis on Norco Prn  and Prednisone PRN  Adrenal Insufficiency per Dr Renne Crigler due to Prolong Steroid use for Osteoarthritis  on Chronic Prednisone   MGUS Follows with Dr Benay Spice for  Labs BPH  Chronic Fatigue and Weakness Underwent R L4-5 hemilaminectomy with diskectomy on 12/26 for severe back pain  radiating to right lower extremity Chronic Dizziness   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 carotid ultrasound which did not show any significant stenosis 2D echo which showed a EF of 60% with inferior wall hypokinesis    Past Medical History:  Diagnosis Date   Aortic stenosis    moderate aortic stenosis   Arthritis    Benign prostatic hypertrophy    Chronic systolic heart failure (HCC)    Chronotropic incompetence with sinus node dysfunction (HCC)    Status post Guidant dual-mode, dual-pacing, dual-sensing  pacemaker   implantation now programmed to AAI with recent generator change.   Coronary artery disease    status post multiple prior percutaneous coronary interventions, microvascular angina per Dr Olevia Perches   Diverticulitis sigmoid colon recurrent    Dysfunctional autonomic nervous system    Dyspnea    Heart murmur    History of primary hypertension    Hypercoagulable state ([place])    chronically anticoagulated with coumadin   Hyperlipidemia    Hyperthyroidism    Hypothyroidism    Dr. 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 Lia Foyer,    Prediabetes 09/21/2017   Stroke ([place])    1990    Past Surgical History:  Procedure Laterality Date   AORTIC  VALVE REPLACEMENT  03/15/2011   Procedure: AORTIC VALVE REPLACEMENT (AVR);  Surgeon: Gaye Pollack, MD;  Location: [place];  Service: Open Heart Surgery;  Laterality: N/A;   APPENDECTOMY     CARDIAC CATHETERIZATION     11   CARDIOVERSION     CARDIOVERSION  04/15/2011   Procedure: CARDIOVERSION;  Surgeon: Loralie Champagne, MD;  Location: [place];  Service: Cardiovascular;  Laterality: N/A;   CARDIOVERSION N/A 09/11/2014   Procedure: CARDIOVERSION;  Surgeon: Sueanne Margarita, MD;  Location: [place] ENDOSCOPY;  Service: Cardiovascular;  Laterality: N/A;   CARDIOVERSION N/A 06/27/2015   Procedure:  CARDIOVERSION;  Surgeon: Thayer Headings, MD;  Location: [place];  Service: Cardiovascular;  Laterality: N/A;   CARDIOVERSION N/A 07/04/2015   Procedure: CARDIOVERSION;  Surgeon: Evans Lance, MD;  Location: [place];  Service: Cardiovascular;  Laterality: N/A;   CARDIOVERSION N/A 04/13/2017   Procedure: CARDIOVERSION;  Surgeon: Sueanne Margarita, MD;  Location[facility] ENDOSCOPY;  Service: Cardiovascular;  Laterality: N/A;   COLONOSCOPY     CORONARY STENT INTERVENTION N/A 10/15/2019   Procedure: CORONARY STENT INTERVENTION;  Surgeon: Jettie Booze, MD;  Location: [place] CV LAB;  Service: Cardiovascular;  Laterality: N/A;   EP IMPLANTABLE DEVICE N/A 06/16/2015   Procedure: Pacemaker Implant;  Surgeon: Evans Lance, MD;  Location: [facility] CV LAB;  Service: Cardiovascular;  Laterality: N/A;   hemrrhoidectomy     LEFT AND RIGHT HEART CATHETERIZATION WITH CORONARY ANGIOGRAM Bilateral 02/01/2011   Procedure: LEFT AND RIGHT HEART CATHETERIZATION WITH CORONARY ANGIOGRAM;  Surgeon: Hillary Bow, MD;  Location: [facility] CATH LAB;  Service: Cardiovascular;  Laterality: Bilateral;   LEFT HEART CATH AND CORONARY ANGIOGRAPHY N/A 10/15/2019   Procedure: LEFT HEART CATH AND CORONARY ANGIOGRAPHY;  Surgeon: Jettie Booze, MD;  Location: [place] CV LAB;  Service: Cardiovascular;  Laterality: N/A;   LEFT HEART CATH AND CORONARY ANGIOGRAPHY N/A 10/07/2020   Procedure: LEFT HEART CATH AND CORONARY ANGIOGRAPHY;  Surgeon: Sherren Mocha, MD;  Location: [place] CV LAB;  Service: Cardiovascular;  Laterality: N/A;   LUMBAR LAMINECTOMY/DECOMPRESSION MICRODISCECTOMY Right 02/23/2021   Procedure: Right Lumbar four-five microdiscectomy;  Surgeon: Eustace Moore, MD;  Location: [place];  Service: Neurosurgery;  Laterality: Right;   MAZE  03/15/2011   Procedure: MAZE;  Surgeon: Gaye Pollack, MD;  Location: [place];  Service: Open Heart Surgery;  Laterality: N/A;   MOHS SURGERY  09/2021    PACEMAKER INSERTION  1991   Guidant PPM, most recent Generator Change by Dr Olevia Perches was 08/22/06   RIGHT/LEFT HEART CATH AND CORONARY ANGIOGRAPHY N/A 07/06/2016   Procedure: Right/Left Heart Cath and Coronary Angiography;  Surgeon: Sherren Mocha, MD;  Location: [place] CV LAB;  Service: Cardiovascular;  Laterality: N/A;   TEE WITHOUT CARDIOVERSION  04/15/2011   Procedure: TRANSESOPHAGEAL ECHOCARDIOGRAM (TEE);  Surgeon: Loralie Champagne, MD;  Location: [place];  Service: Cardiovascular;  Laterality: N/A;   TEE WITHOUT CARDIOVERSION N/A 09/11/2014   Procedure: TRANSESOPHAGEAL ECHOCARDIOGRAM (TEE);  Surgeon: Sueanne Margarita, MD;  Location: [facility] ENDOSCOPY;  Service: Cardiovascular;  Laterality: N/A;    Allergies  Allergen Reactions   Contrast Media [Iodinated Contrast Media] Hives   Gadolinium Derivatives Hives   Iodine-131 Hives   Metrizamide Hives   Tetanus Toxoids     Outpatient Encounter Medications as of 11/18/2021  Medication Sig   amLODipine (NORVASC) 2.5 MG tablet Take 1 tablet (2.5 mg total) by mouth at bedtime. (Patient taking differently: Take 2.5  mg by mouth at bedtime as needed. Taking it PRN)   apixaban (ELIQUIS) 5 MG TABS tablet TAKE 1 TABLET BY MOUTH 2 TIMES DAILY. (Patient taking differently: Take 5 mg by mouth 2 (two) times daily.)   butalbital-acetaminophen-caffeine (FIORICET) 50-325-40 MG tablet Take 1 tablet by mouth twice daily as needed for headache.   Cholecalciferol (VITAMIN D3) 2000 units capsule Take 2,000 Units by mouth daily.   cilostazol (PLETAL) 100 MG tablet Take 1 tablet (100 mg total) by mouth 2 (two) times daily.   Coenzyme Q10 200 MG TABS Take 200 mg by mouth daily.   desoximetasone (TOPICORT) 0.25 % cream Apply 1 application topically 2 (two) times daily. (Patient taking differently: Apply 1 application  topically 2 (two) times daily as needed (rash or irritation).)   diclofenac Sodium (VOLTAREN) 1 % GEL Apply 2 g topically 2 (two) times daily as needed  (knee pain).   digoxin (LANOXIN) 0.125 MG tablet Take 1 tablet (0.125 mg total) by mouth daily.   docusate sodium (COLACE) 100 MG capsule Take 100 mg by mouth 2 (two) times daily.   doxazosin (CARDURA) 4 MG tablet 1 tablet PO QD   ezetimibe (ZETIA) 10 MG tablet Take 1 tablet (10 mg total) by mouth daily.   fluorouracil (EFUDEX) 5 % cream 1 application Externally Once a day 21 days   furosemide (LASIX) 20 MG tablet Take 1 tablet (20 mg total) by mouth 3 (three) times a week.   HYDROcodone-acetaminophen (NORCO) 7.5-325 MG tablet Take 1 tablet by mouth 3 (three) times daily.   LORazepam (ATIVAN) 1 MG tablet TAKE 1 TABLET BY MOUTH AT BEDTIME. MAY ALSO TAKE 1/2 TABLET DAILY AS NEEDED FOR ANXIETY.   magnesium oxide (MAG-OX) 400 MG tablet Take 400 mg by mouth daily.   metoprolol succinate (TOPROL-XL) 50 MG 24 hr tablet Take 1 tablet (50 mg total) by mouth 2 (two) times daily.   Multiple Vitamins-Minerals (MULTIVITAMIN WITH MINERALS) tablet Take 1 tablet by mouth daily.   naloxegol oxalate (MOVANTIK) 12.5 MG TABS tablet Take 1 tablet (12.5 mg total) by mouth daily.   NON FORMULARY Zegrid Take one tablet by mouth once daily as needed.   nystatin (MYCOSTATIN) 100000 UNIT/ML suspension Take 5 mLs (500,000 Units total) by mouth 4 (four) times daily.   pantoprazole (PROTONIX) 40 MG tablet TAKE 1 TABLET BY MOUTH DAILY. (Patient taking differently: Take 40 mg by mouth 2 (two) times daily.)   polyethylene glycol (MIRALAX / GLYCOLAX) 17 g packet Take 17 g by mouth daily.   potassium chloride SA (KLOR-CON M) 20 MEQ tablet TAKE 1 TABLET BY MOUTH DAILY. (Patient taking differently: Take 20 mEq by mouth daily.)   predniSONE (DELTASONE) 10 MG tablet Take 1 tablet (10 mg total) by mouth daily with breakfast. (Patient taking differently: Take 5 mg by mouth daily with breakfast. Continuous)   rosuvastatin (CRESTOR) 20 MG tablet TAKE 1 TABLET (20 MG TOTAL) BY MOUTH AT BEDTIME. (Patient taking differently: Take 20 mg by  mouth at bedtime.)   valACYclovir (VALTREX) 500 MG tablet Take 1 tablet twice a day for 7 days   mometasone (ELOCON) 0.1 % cream Apply 1 Application topically daily.   nitroGLYCERIN (NITROSTAT) 0.4 MG SL tablet PLACE 1 TABLET UNDER THE TONGUE EVERY 5 MINUTES AS NEEDED FOR CHEST PAIN.   [DISCONTINUED] mometasone (ELOCON) 0.1 % cream Apply 1 application topically daily. (Patient taking differently: Apply 1 application  topically daily as needed (rash or irritation).)   [DISCONTINUED] nitroGLYCERIN (NITROSTAT) 0.4 MG SL  tablet PLACE 1 TABLET UNDER THE TONGUE EVERY 5 MINUTES AS NEEDED FOR CHEST PAIN.   [DISCONTINUED] pregabalin (LYRICA) 75 MG capsule Take 75 mg by mouth 2 (two) times daily.   No facility-administered encounter medications on file as of 11/18/2021.    Review of Systems:  Review of Systems  Constitutional:  Negative for activity change, appetite change and unexpected weight change.  HENT: Negative.    Eyes:  Positive for visual disturbance.  Respiratory:  Negative for cough and shortness of breath.   Cardiovascular:  Negative for leg swelling.  Gastrointestinal:  Positive for constipation.  Genitourinary:  Positive for frequency.  Musculoskeletal:  Positive for arthralgias, back pain, gait problem, myalgias, neck pain and neck stiffness.  Skin: Negative.  Negative for rash.  Neurological:  Negative for dizziness and weakness.  Psychiatric/Behavioral:  Negative for confusion and sleep disturbance.   All other systems reviewed and are negative.   Health Maintenance  Topic Date Due   TETANUS/TDAP  Never done   Zoster Vaccines- Shingrix (1 of 2) Never done   Pneumonia Vaccine 40+ Years old (1 - PCV) Never done   COVID-19 Vaccine (4 - Pfizer risk series) 12/27/2019   INFLUENZA VACCINE  09/29/2021   HPV VACCINES  Aged Out    Physical Exam: Vitals:   11/18/21 0902  BP: 126/78  Pulse: 77  Temp: (!) 97.5 F (36.4 C)  SpO2: 95%  Weight: 220 lb (99.8 kg)  Height: '6\' 1"'$   (1.854 m)   Body mass index is 29.03 kg/m. Physical Exam Vitals reviewed.  Constitutional:      Appearance: Normal appearance.  HENT:     Head: Normocephalic.     Nose: Nose normal.     Mouth/Throat:     Mouth: Mucous membranes are moist.     Pharynx: Oropharynx is clear.  Eyes:     Pupils: Pupils are equal, round, and reactive to light.  Cardiovascular:     Rate and Rhythm: Normal rate. Rhythm irregular.     Pulses: Normal pulses.     Heart sounds: No murmur heard. Pulmonary:     Effort: Pulmonary effort is normal. No respiratory distress.     Breath sounds: Normal breath sounds. No rales.  Abdominal:     General: Abdomen is flat. Bowel sounds are normal.     Palpations: Abdomen is soft.  Musculoskeletal:        General: No swelling.     Cervical back: Neck supple.  Skin:    General: Skin is warm.  Neurological:     General: No focal deficit present.     Mental Status: He is alert and oriented to person, place, and time.  Psychiatric:        Mood and Affect: Mood normal.        Thought Content: Thought content normal.     Labs reviewed: Basic Metabolic Panel: Recent Labs    04/12/21 1441 04/13/21 0109 08/06/21 1151 10/22/21 0000 11/10/21 0000  NA 139 136 140 139 143  K 4.4 3.9 4.3 4.1 4.3  CL 103 103 100 100 101  CO2  --  23 31 28* 28*  GLUCOSE 195* 204* 130*  --   --   BUN '16 15 22 11  '$ --   CREATININE 1.00 1.03 1.16 1.0 1.0  CALCIUM  --  8.9 9.6 9.2 9.3   Liver Function Tests: Recent Labs    04/12/21 1429 08/06/21 1151  AST 26 21  ALT 19 26  ALKPHOS  82 88  BILITOT 0.6 0.6  PROT 7.5 7.2  7.5  ALBUMIN 4.0 4.1   No results for input(s): "LIPASE", "AMYLASE" in the last 8760 hours. No results for input(s): "AMMONIA" in the last 8760 hours. CBC: Recent Labs    04/12/21 1429 04/12/21 1441 04/13/21 0109 08/06/21 1151 10/22/21 0000 11/10/21 0000  WBC 10.5  --  8.8 13.4* 7.3 9.0  NEUTROABS 9.5*  --  8.2* 11.2*  --   --   HGB 11.2*   < >  10.3* 11.6* 11.0* 11.3*  HCT 36.6*   < > 33.2* 35.6* 33* 34*  MCV 91.0  --  89.5 83.8  --   --   PLT 212  --  209 225.0 214 278   < > = values in this interval not displayed.   Lipid Panel: Recent Labs    04/13/21 0109  CHOL 131  HDL 62  LDLCALC 64  TRIG 27  CHOLHDL 2.1   Lab Results  Component Value Date   HGBA1C 7.4 10/22/2021    Procedures since last visit: No results found.  Assessment/Plan 1. Angina pectoris ([place]) Not using it but wants to keep for assurance - nitroGLYCERIN (NITROSTAT) 0.4 MG SL tablet; PLACE 1 TABLET UNDER THE TONGUE EVERY 5 MINUTES AS NEEDED FOR CHEST PAIN.  Dispense: 25 tablet; Refill: 5  2. Dysuria Symptoms resolved.  Has plan to see urology  3. Bilateral leg edema Use Lasix as needed  4. Type 2 diabetes mellitus with other circulatory complication, without long-term current use of insulin (HCC) A1c with elevated.  We talked about different options but at this time he wants to do diet modification  5. Retinal artery branch occlusion of right eye Continue on Pletal by neurology  Gets regular eye exams  6. Other osteoarthritis involving multiple joints On chronic Norco Prescribed by [place]  7. S/P laminectomy Doing well  8. Hyperlipidemia, unspecified hyperlipidemia type On statin  9. Essential hypertension Blood pressure fluctuates and the nurse is managing it by using amlodipine as needed  10.  Headaches Fioricet  11. Coronary artery disease of native artery of native heart with stable angina pectoris ([place])  Continues to have issue with Angina Nitro makes BP drop Not able to do much  S/p Multiple Caths Closely with Cardiologists On Statin  12. Anemia, unspecified type Follows with Dr. Malachy Mood  13. Benign prostatic hyperplasia with lower urinary tract symptoms, symptom details unspecified Cardura  14. Gastroesophageal reflux disease without esophagitis On Protonix  15. Persistent atrial fibrillation (HCC) Eliquis and  metoprolol 16 Constipation Now on Movantik   Labs/tests ordered:  * No order type specified * Next appt:  02/16/2022

## 2021-11-22 ENCOUNTER — Encounter (HOSPITAL_BASED_OUTPATIENT_CLINIC_OR_DEPARTMENT_OTHER): Payer: Self-pay | Admitting: Cardiology

## 2021-11-25 ENCOUNTER — Other Ambulatory Visit: Payer: Self-pay | Admitting: Internal Medicine

## 2021-11-25 ENCOUNTER — Other Ambulatory Visit (HOSPITAL_BASED_OUTPATIENT_CLINIC_OR_DEPARTMENT_OTHER): Payer: Self-pay

## 2021-11-25 ENCOUNTER — Other Ambulatory Visit: Payer: Self-pay | Admitting: Cardiovascular Disease

## 2021-11-25 ENCOUNTER — Encounter: Payer: Self-pay | Admitting: Cardiovascular Disease

## 2021-11-25 ENCOUNTER — Ambulatory Visit: Payer: Medicare Other | Attending: Cardiovascular Disease | Admitting: Cardiovascular Disease

## 2021-11-25 VITALS — BP 116/76 | HR 63 | Ht 73.0 in | Wt 220.0 lb

## 2021-11-25 DIAGNOSIS — D6869 Other thrombophilia: Secondary | ICD-10-CM | POA: Diagnosis not present

## 2021-11-25 DIAGNOSIS — I4819 Other persistent atrial fibrillation: Secondary | ICD-10-CM | POA: Insufficient documentation

## 2021-11-25 DIAGNOSIS — Z952 Presence of prosthetic heart valve: Secondary | ICD-10-CM | POA: Insufficient documentation

## 2021-11-25 DIAGNOSIS — I209 Angina pectoris, unspecified: Secondary | ICD-10-CM

## 2021-11-25 DIAGNOSIS — I5032 Chronic diastolic (congestive) heart failure: Secondary | ICD-10-CM | POA: Diagnosis not present

## 2021-11-25 DIAGNOSIS — G903 Multi-system degeneration of the autonomic nervous system: Secondary | ICD-10-CM | POA: Insufficient documentation

## 2021-11-25 MED ORDER — PREDNISONE 10 MG PO TABS
10.0000 mg | ORAL_TABLET | Freq: Every day | ORAL | 1 refills | Status: DC
  Filled 2021-11-25: qty 90, 90d supply, fill #0
  Filled 2022-02-18: qty 90, 90d supply, fill #1

## 2021-11-25 MED ORDER — PANTOPRAZOLE SODIUM 40 MG PO TBEC
DELAYED_RELEASE_TABLET | Freq: Every day | ORAL | 3 refills | Status: DC
  Filled 2021-11-25: qty 90, fill #0
  Filled 2022-01-12: qty 90, 90d supply, fill #0
  Filled ????-??-??: fill #0

## 2021-11-25 MED ORDER — NALOXEGOL OXALATE 12.5 MG PO TABS
12.5000 mg | ORAL_TABLET | Freq: Every day | ORAL | 0 refills | Status: DC
  Filled 2021-11-25 – 2021-11-27 (×2): qty 30, 30d supply, fill #0

## 2021-11-25 NOTE — Progress Notes (Signed)
Cardiology Office Note:    Date:  11/25/2021   ID:  Kevin Buff, [name], DOB 1934-10-09, MRN [id number]  PCP:  [name], [name]   [facility] Providers Cardiologist:  Buford Dresser, [name]     Referring [name]: [name], [name]   No chief complaint on file.   History of Present Illness:    Kevin Buff, [name] is a 87 y.o. male with a hx of longstanding atypical atrial flutter and atrial tachycardia, coronary artery disease with chronic angina, aortic valve disease status post bioprosthetic aortic valve replacement, orthostatic hypotension, and chronic diastolic heart failure, presenting for follow-up evaluation.  The patient is here with his personal nurse, Maudie Mercury, today.  He called in last week because of problems with hypotension.  He had some systolic blood pressure readings in the 70s and 80s and was highly symptomatic with low blood pressure associated with lightheadedness and marked fatigue.  He discontinued amlodipine.  We discussed his other medications and his metoprolol has been reduced to 50 mg in the morning and 25 mg in the evening.  He discontinued furosemide which she was taking 3 times per week.  He increased his prednisone from 5 mg daily up to 10 mg daily just in case there is a component of chronic adrenal insufficiency as he has been on long-term low-dose prednisone.  His blood pressure readings have improved and he brings these in for review today.  He has not had any systolic readings under 90 mmHg in the last 4 days.  He denies chest pain.  His chronic exertional dyspnea is unchanged.  He has a major functional limitation related to his arthritis and he is now on chronic opioid treatment with hydrocodone/acetaminophen.  He is doing everything he can do maintain his functional capacity at this time.  Past Medical History:  Diagnosis Date   Aortic stenosis    moderate aortic stenosis   Arthritis    Benign prostatic hypertrophy    Chronic systolic heart  failure (HCC)    Chronotropic incompetence with sinus node dysfunction (HCC)    Status post Guidant dual-mode, dual-pacing, dual-sensing  pacemaker   implantation now programmed to AAI with recent generator change.   Coronary artery disease    status post multiple prior percutaneous coronary interventions, microvascular angina per Dr Olevia Perches   Diverticulitis sigmoid colon recurrent    Dysfunctional autonomic nervous system    Dyspnea    Heart murmur    History of primary hypertension    Hypercoagulable state ([place])    chronically anticoagulated with coumadin   Hyperlipidemia    Hyperthyroidism    Hypothyroidism    Dr. 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 Lia Foyer,    Prediabetes 09/21/2017   Stroke ([place])    1990    Past Surgical History:  Procedure Laterality Date   AORTIC VALVE REPLACEMENT  03/15/2011   Procedure: AORTIC VALVE REPLACEMENT (AVR);  Surgeon: Gaye Pollack, [name];  Location: [place];  Service: Open Heart Surgery;  Laterality: N/A;   APPENDECTOMY     CARDIAC CATHETERIZATION     11   CARDIOVERSION     CARDIOVERSION  04/15/2011   Procedure: CARDIOVERSION;  Surgeon: Loralie Champagne, [name];  Location: [place];  Service: Cardiovascular;  Laterality: N/A;   CARDIOVERSION N/A 09/11/2014   Procedure: CARDIOVERSION;  Surgeon: Sueanne Margarita, [name];  Location: [facility];  Service: Cardiovascular;  Laterality: N/A;   CARDIOVERSION N/A 06/27/2015   Procedure: CARDIOVERSION;  Surgeon: Thayer Headings, [name];  Location: [place];  Service: Cardiovascular;  Laterality: N/A;   CARDIOVERSION N/A 07/04/2015   Procedure: CARDIOVERSION;  Surgeon: Evans Lance, [name];  Location: [place];  Service: Cardiovascular;  Laterality: N/A;   CARDIOVERSION N/A 04/13/2017   Procedure: CARDIOVERSION;  Surgeon: Sueanne Margarita, [name];  Location: [facility] ENDOSCOPY;  Service:  Cardiovascular;  Laterality: N/A;   COLONOSCOPY     CORONARY STENT INTERVENTION N/A 10/15/2019   Procedure: CORONARY STENT INTERVENTION;  Surgeon: Jettie Booze, [name];  Location: [place] CV LAB;  Service: Cardiovascular;  Laterality: N/A;   EP IMPLANTABLE DEVICE N/A 06/16/2015   Procedure: Pacemaker Implant;  Surgeon: Evans Lance, [name];  Location: [place] CV LAB;  Service: Cardiovascular;  Laterality: N/A;   hemrrhoidectomy     LEFT AND RIGHT HEART CATHETERIZATION WITH CORONARY ANGIOGRAM Bilateral 02/01/2011   Procedure: LEFT AND RIGHT HEART CATHETERIZATION WITH CORONARY ANGIOGRAM;  Surgeon: Hillary Bow, [name];  Location: [facility] CATH LAB;  Service: Cardiovascular;  Laterality: Bilateral;   LEFT HEART CATH AND CORONARY ANGIOGRAPHY N/A 10/15/2019   Procedure: LEFT HEART CATH AND CORONARY ANGIOGRAPHY;  Surgeon: Jettie Booze, [name];  Location: [place] CV LAB;  Service: Cardiovascular;  Laterality: N/A;   LEFT HEART CATH AND CORONARY ANGIOGRAPHY N/A 10/07/2020   Procedure: LEFT HEART CATH AND CORONARY ANGIOGRAPHY;  Surgeon: Sherren Mocha, [name];  Location: [place] CV LAB;  Service: Cardiovascular;  Laterality: N/A;   LUMBAR LAMINECTOMY/DECOMPRESSION MICRODISCECTOMY Right 02/23/2021   Procedure: Right Lumbar four-five microdiscectomy;  Surgeon: Eustace Moore, [name];  Location: [place];  Service: Neurosurgery;  Laterality: Right;   MAZE  03/15/2011   Procedure: MAZE;  Surgeon: Gaye Pollack, [name];  Location: [place];  Service: Open Heart Surgery;  Laterality: N/A;   MOHS SURGERY  09/2021   PACEMAKER INSERTION  1991   Guidant PPM, most recent Generator Change by Dr Olevia Perches was 08/22/06   RIGHT/LEFT HEART CATH AND CORONARY ANGIOGRAPHY N/A 07/06/2016   Procedure: Right/Left Heart Cath and Coronary Angiography;  Surgeon: Sherren Mocha, [name];  Location: [place] CV LAB;  Service: Cardiovascular;  Laterality: N/A;   TEE WITHOUT CARDIOVERSION  04/15/2011   Procedure: TRANSESOPHAGEAL  ECHOCARDIOGRAM (TEE);  Surgeon: Loralie Champagne, [name];  Location: [place];  Service: Cardiovascular;  Laterality: N/A;   TEE WITHOUT CARDIOVERSION N/A 09/11/2014   Procedure: TRANSESOPHAGEAL ECHOCARDIOGRAM (TEE);  Surgeon: Sueanne Margarita, [name];  Location: [facility] ENDOSCOPY;  Service: Cardiovascular;  Laterality: N/A;    Current Medications: Current Meds  Medication Sig   apixaban (ELIQUIS) 5 MG TABS tablet TAKE 1 TABLET BY MOUTH 2 TIMES DAILY. (Patient taking differently: Take 5 mg by mouth 2 (two) times daily.)   butalbital-acetaminophen-caffeine (FIORICET) 50-325-40 MG tablet Take 1 tablet by mouth twice daily as needed for headache.   Cholecalciferol (VITAMIN D3) 2000 units capsule Take 2,000 Units by mouth daily.   cilostazol (PLETAL) 100 MG tablet Take 1 tablet (100 mg total) by mouth 2 (two) times daily.   Coenzyme Q10 200 MG TABS Take 200 mg by mouth daily.   desoximetasone (TOPICORT) 0.25 % cream Apply 1 application topically 2 (two) times daily. (Patient taking differently: Apply 1 application  topically 2 (two) times daily as needed (rash or irritation).)   diclofenac Sodium (VOLTAREN) 1 % GEL Apply 2 g topically 2 (two) times daily as needed (knee pain).  digoxin (LANOXIN) 0.125 MG tablet Take 1 tablet (0.125 mg total) by mouth daily.   docusate sodium (COLACE) 100 MG capsule Take 100 mg by mouth 2 (two) times daily.   doxazosin (CARDURA) 4 MG tablet 1 tablet PO QD   ezetimibe (ZETIA) 10 MG tablet Take 1 tablet (10 mg total) by mouth daily.   fluorouracil (EFUDEX) 5 % cream 1 application Externally Once a day 21 days   HYDROcodone-acetaminophen (NORCO) 7.5-325 MG tablet Take 1 tablet by mouth 3 (three) times daily.   LORazepam (ATIVAN) 1 MG tablet TAKE 1 TABLET BY MOUTH AT BEDTIME. MAY ALSO TAKE 1/2 TABLET DAILY AS NEEDED FOR ANXIETY.   magnesium oxide (MAG-OX) 400 MG tablet Take 400 mg by mouth daily.   metoprolol succinate (TOPROL-XL) 50 MG 24 hr tablet Take 1 tablet (50 mg total) by  mouth 2 (two) times daily.   mometasone (ELOCON) 0.1 % cream Apply 1 Application topically daily.   Multiple Vitamins-Minerals (MULTIVITAMIN WITH MINERALS) tablet Take 1 tablet by mouth daily.   naloxegol oxalate (MOVANTIK) 12.5 MG TABS tablet Take 1 tablet (12.5 mg total) by mouth daily.   nitroGLYCERIN (NITROSTAT) 0.4 MG SL tablet PLACE 1 TABLET UNDER THE TONGUE EVERY 5 MINUTES AS NEEDED FOR CHEST PAIN.   NON FORMULARY Zegrid Take one tablet by mouth once daily as needed.   nystatin (MYCOSTATIN) 100000 UNIT/ML suspension Take 5 mLs (500,000 Units total) by mouth 4 (four) times daily.   pantoprazole (PROTONIX) 40 MG tablet TAKE 1 TABLET BY MOUTH DAILY. (Patient taking differently: Take 40 mg by mouth 2 (two) times daily.)   polyethylene glycol (MIRALAX / GLYCOLAX) 17 g packet Take 17 g by mouth daily.   potassium chloride SA (KLOR-CON M) 20 MEQ tablet TAKE 1 TABLET BY MOUTH DAILY. (Patient taking differently: Take 20 mEq by mouth daily as needed. With Lasix)   predniSONE (DELTASONE) 10 MG tablet Take 1 tablet (10 mg total) by mouth daily with breakfast. (Patient taking differently: Take 5 mg by mouth daily with breakfast. Continuous)   rosuvastatin (CRESTOR) 20 MG tablet TAKE 1 TABLET (20 MG TOTAL) BY MOUTH AT BEDTIME. (Patient taking differently: Take 20 mg by mouth at bedtime.)   valACYclovir (VALTREX) 500 MG tablet Take 1 tablet twice a day for 7 days     Allergies:   Contrast media [iodinated contrast media], Gadolinium derivatives, Iodine-131, Metrizamide, and Tetanus toxoids   Social History   Socioeconomic History   Marital status: Married    Spouse name: Baker Janus    Number of children: 3   Years of education: Not on file   Highest education level: Not on file  Occupational History   Occupation: Retired    Comment: Physician  Tobacco Use   Smoking status: Never   Smokeless tobacco: Never  Vaping Use   Vaping Use: Never used  Substance and Sexual Activity   Alcohol use: No     Alcohol/week: 0.0 standard drinks of alcohol   Drug use: No   Sexual activity: Not Currently  Other Topics Concern   Not on file  Social History Narrative   Married to [place]. Has grown children   Retired Horticulturist, commercial [name]      Never smoker no alcohol      Social Determinants of Radio broadcast assistant Strain: Not on file  Food Insecurity: Not on file  Transportation Needs: Not on file  Physical Activity: Not on file  Stress: Not on file  Social Connections: Not on file  Family History: The patient's family history includes Anorexia nervosa in his daughter; Atrial fibrillation in his brother; CAD in his brother; Depression in his daughter; Heart disease in his brother; Hypertension in his son; Sarcoidosis in his brother. There is no history of Anesthesia problems, Hypotension, Malignant hyperthermia, or Pseudochol deficiency.  ROS:   Please see the history of present illness.    All other systems reviewed and are negative.  EKGs/Labs/Other Studies Reviewed:    The following studies were reviewed today: Echo 04/13/21:  1. Left ventricular ejection fraction, by estimation, is 60 to 65%. The  left ventricle has normal function. The left ventricle demonstrates  regional wall motion abnormalities. Basal inferior hypokinesis. There is  moderate left ventricular hypertrophy.  Left ventricular diastolic parameters are indeterminate.   2. Right ventricular systolic function is mildly reduced. The right  ventricular size is mildly enlarged. There is mildly elevated pulmonary  artery systolic pressure. The estimated right ventricular systolic  pressure is 13.2 mmHg.   3. Left atrial size was severely dilated.   4. The mitral valve is degenerative. Trivial mitral valve regurgitation.  No evidence of mitral stenosis. Moderate mitral annular calcification.   5. There is a 25 mm Edwards Magna-Ease valve present in the aortic  position.      Aortic valve regurgitation is not visualized.  Aortic valve mean  gradient measures 14.0 mmHg. Vmax 2.5 m/s, EOA 2.7 cm^2, DI 0.7. Echo  findings are consistent with normal structure and function of the aortic  valve prosthesis.   6. The inferior vena cava is dilated in size with >50% respiratory  variability, suggesting right atrial pressure of 8 mmHg.   EKG:  EKG is not ordered today.    Recent Labs: 08/06/2021: ALT 26 10/22/2021: BUN 11 11/10/2021: Creatinine 1.0; Hemoglobin 11.3; Platelets 278; Potassium 4.3; Sodium 143  Recent Lipid Panel    Component Value Date/Time   CHOL 131 04/13/2021 0109   CHOL 158 09/02/2017 1006   TRIG 27 04/13/2021 0109   HDL 62 04/13/2021 0109   HDL 72 09/02/2017 1006   CHOLHDL 2.1 04/13/2021 0109   VLDL 5 04/13/2021 0109   LDLCALC 64 04/13/2021 0109   LDLCALC 68 09/02/2017 1006   LDLDIRECT 117.9 11/16/2010 1355     Risk Assessment/Calculations:    CHA2DS2-VASc Score = 7   This indicates a 11.2% annual risk of stroke. The patient's score is based upon: CHF History: 1 HTN History: 1 Diabetes History: 0 Stroke History: 2 Vascular Disease History: 1 Age Score: 2 Gender Score: 0               Physical Exam:    VS:  BP 116/76   Pulse 63   Ht '6\' 1"'$  (1.854 m)   Wt 220 lb (99.8 kg)   SpO2 95%   BMI 29.03 kg/m     Wt Readings from Last 3 Encounters:  11/25/21 220 lb (99.8 kg)  11/18/21 220 lb (99.8 kg)  11/09/21 218 lb 9.6 oz (99.2 kg)     GEN:  Well nourished, well developed in no acute distress HEENT: Normal NECK: No JVD; No carotid bruits LYMPHATICS: No lymphadenopathy CARDIAC: irregularly irregular, 2/6 systolic murmur at the right upper sternal border RESPIRATORY:  Clear to auscultation without rales, wheezing or rhonchi  ABDOMEN: Soft, non-tender, non-distended MUSCULOSKELETAL:  No edema; No deformity  SKIN: Warm and dry NEUROLOGIC:  Alert and oriented x 3 PSYCHIATRIC:  Normal affect   ASSESSMENT:    1. Chronic diastolic heart  failure ([place])   2. S/P AVR  (aortic valve replacement)   3. Secondary hypercoagulable state ([place])   4. Persistent atrial fibrillation ([place])   5. Neurogenic orthostatic hypotension (HCC)    PLAN:    In order of problems listed above:  The patient appears clinically stable.  He has discontinued amlodipine and furosemide in the setting of low blood pressure.  We reviewed his 2D echo images today from his most recent study in February of this year.  His LVEF appears completely normal with no regional wall motion abnormalities. Normal function of his aortic bioprosthesis noted on past echo studies.  We will repeat in February of next year prior to his follow-up evaluation.  Patient is anticoagulated with apixaban and seems to be tolerating this well. Tolerating oral anticoagulation without significant bleeding problems.  GI prophylaxis with twice daily Protonix Followed by Dr. Lovena Le, rate controlled with metoprolol. We discussed this last week and adjustments were made in his medicine regimen.  Amlodipine is now discontinued.  Furosemide is discontinued.  The patient is otherwise well-appearing.  His prednisone was increased to 10 mg daily.  His blood pressure readings over the last 4 days have been within normal range and he will continue with his current medications.  If he has recurrent episodes of hypotension despite discontinuation of amlodipine and furosemide, I would probably start him on midodrine 5 mg with meals.  He will call if this occurs.  Otherwise we will continue with his current management and I will see him back in February after his echocardiogram is completed.           Medication Adjustments/Labs and Tests Ordered: Current medicines are reviewed at length with the patient today.  Concerns regarding medicines are outlined above.  Orders Placed This Encounter  Procedures   ECHOCARDIOGRAM COMPLETE   No orders of the defined types were placed in this encounter.   Patient Instructions  Medication  Instructions:  Your physician recommends that you continue on your current medications as directed. Please refer to the Current Medication list given to you today.  *If you need a refill on your cardiac medications before your next appointment, please call your pharmacy*   Lab Work: NONE If you have labs (blood work) drawn today and your tests are completely normal, you will receive your results only by: [place] (if you have MyChart) OR A paper copy in the mail If you have any lab test that is abnormal or we need to change your treatment, we will call you to review the results.   Testing/Procedures: ECHO (one week prior to next visit) Your physician has requested that you have an echocardiogram. Echocardiography is a painless test that uses sound waves to create images of your heart. It provides your doctor with information about the size and shape of your heart and how well your heart's chambers and valves are working. This procedure takes approximately one hour. There are no restrictions for this procedure.   Follow-Up: At [facility], you and your health needs are our priority.  As part of our continuing mission to provide you with exceptional heart care, we have created designated Provider Care Teams.  These Care Teams include your primary Cardiologist (physician) and Advanced Practice Providers (APPs -  Physician Assistants and Nurse Practitioners) who all work together to provide you with the care you need, when you need it.  Your next appointment:   5 month(s)  The format for your next appointment:   In  Person  Provider:   Sherren Mocha, [name]    Important Information About Sugar         Signed, Sherren Mocha, [name]  11/25/2021 11:19 AM    [place]

## 2021-11-25 NOTE — Patient Instructions (Signed)
Medication Instructions:  Your physician recommends that you continue on your current medications as directed. Please refer to the Current Medication list given to you today.  *If you need a refill on your cardiac medications before your next appointment, please call your pharmacy*   Lab Work: NONE If you have labs (blood work) drawn today and your tests are completely normal, you will receive your results only by: [place] (if you have MyChart) OR A paper copy in the mail If you have any lab test that is abnormal or we need to change your treatment, we will call you to review the results.   Testing/Procedures: ECHO (one week prior to next visit) Your physician has requested that you have an echocardiogram. Echocardiography is a painless test that uses sound waves to create images of your heart. It provides your doctor with information about the size and shape of your heart and how well your heart's chambers and valves are working. This procedure takes approximately one hour. There are no restrictions for this procedure.   Follow-Up: At [facility], you and your health needs are our priority.  As part of our continuing mission to provide you with exceptional heart care, we have created designated Provider Care Teams.  These Care Teams include your primary Cardiologist (physician) and Advanced Practice Providers (APPs -  Physician Assistants and Nurse Practitioners) who all work together to provide you with the care you need, when you need it.  Your next appointment:   5 month(s)  The format for your next appointment:   In Person  Provider:   Sherren Mocha, MD    Important Information About Sugar

## 2021-11-27 ENCOUNTER — Other Ambulatory Visit (HOSPITAL_BASED_OUTPATIENT_CLINIC_OR_DEPARTMENT_OTHER): Payer: Self-pay

## 2021-11-27 DIAGNOSIS — L905 Scar conditions and fibrosis of skin: Secondary | ICD-10-CM | POA: Diagnosis not present

## 2021-11-27 DIAGNOSIS — D485 Neoplasm of uncertain behavior of skin: Secondary | ICD-10-CM | POA: Diagnosis not present

## 2021-11-28 ENCOUNTER — Encounter: Payer: Self-pay | Admitting: Cardiovascular Disease

## 2021-11-30 ENCOUNTER — Other Ambulatory Visit (HOSPITAL_BASED_OUTPATIENT_CLINIC_OR_DEPARTMENT_OTHER): Payer: Self-pay

## 2021-11-30 MED ORDER — MIDODRINE HCL 2.5 MG PO TABS
2.5000 mg | ORAL_TABLET | Freq: Two times a day (BID) | ORAL | 3 refills | Status: DC
  Filled 2021-11-30: qty 60, 30d supply, fill #0
  Filled 2021-12-25: qty 60, 30d supply, fill #1
  Filled 2022-01-25 – 2022-01-29 (×2): qty 60, 30d supply, fill #2
  Filled 2022-02-24: qty 60, 30d supply, fill #3

## 2021-11-30 NOTE — Telephone Encounter (Signed)
Please call in midodrine 2.5 mg BID with meals. thx

## 2021-12-04 ENCOUNTER — Ambulatory Visit (INDEPENDENT_AMBULATORY_CARE_PROVIDER_SITE_OTHER): Payer: Medicare Other

## 2021-12-04 DIAGNOSIS — I495 Sick sinus syndrome: Secondary | ICD-10-CM

## 2021-12-04 LAB — CUP PACEART REMOTE DEVICE CHECK
Battery Remaining Longevity: 60 mo
Battery Remaining Percentage: 61 %
Brady Statistic RA Percent Paced: 0 %
Brady Statistic RV Percent Paced: 52 %
Date Time Interrogation Session: 20231006021000
Implantable Lead Implant Date: 20170417
Implantable Lead Implant Date: 20170417
Implantable Lead Location: 753859
Implantable Lead Location: 753860
Implantable Lead Model: 7740
Implantable Lead Model: 7741
Implantable Lead Serial Number: 662696
Implantable Lead Serial Number: 751382
Implantable Pulse Generator Implant Date: 20170417
Lead Channel Impedance Value: 634 Ohm
Lead Channel Impedance Value: 721 Ohm
Lead Channel Setting Pacing Amplitude: 2.5 V
Lead Channel Setting Pacing Amplitude: 2.5 V
Lead Channel Setting Pacing Pulse Width: 0.4 ms
Lead Channel Setting Sensing Sensitivity: 2.5 mV
Pulse Gen Serial Number: 718418

## 2021-12-08 NOTE — Progress Notes (Signed)
Remote pacemaker transmission.   

## 2021-12-10 ENCOUNTER — Other Ambulatory Visit (HOSPITAL_BASED_OUTPATIENT_CLINIC_OR_DEPARTMENT_OTHER): Payer: Self-pay

## 2021-12-12 ENCOUNTER — Other Ambulatory Visit (HOSPITAL_BASED_OUTPATIENT_CLINIC_OR_DEPARTMENT_OTHER): Payer: Self-pay

## 2021-12-14 ENCOUNTER — Other Ambulatory Visit (HOSPITAL_BASED_OUTPATIENT_CLINIC_OR_DEPARTMENT_OTHER): Payer: Self-pay

## 2021-12-14 ENCOUNTER — Other Ambulatory Visit: Payer: Self-pay | Admitting: Internal Medicine

## 2021-12-14 MED ORDER — HYDROCODONE-ACETAMINOPHEN 7.5-325 MG PO TABS
1.0000 | ORAL_TABLET | Freq: Three times a day (TID) | ORAL | 0 refills | Status: DC
  Filled 2021-12-14: qty 90, 30d supply, fill #0

## 2021-12-14 NOTE — Telephone Encounter (Signed)
Patient is requesting a refill of the following medications: Requested Prescriptions   Pending Prescriptions Disp Refills   HYDROcodone-acetaminophen (NORCO) 7.5-325 MG tablet 90 tablet 0    Sig: Take 1 tablet by mouth 3 (three) times daily.    Date of last refill:11/11/2021  Refill amount: #90  Treatment agreement date: 10/07/2021

## 2021-12-24 ENCOUNTER — Encounter: Payer: Self-pay | Admitting: Internal Medicine

## 2021-12-24 ENCOUNTER — Other Ambulatory Visit: Payer: Self-pay | Admitting: Internal Medicine

## 2021-12-24 ENCOUNTER — Other Ambulatory Visit: Payer: Self-pay | Admitting: Cardiovascular Disease

## 2021-12-24 ENCOUNTER — Ambulatory Visit: Payer: Medicare Other | Attending: Internal Medicine | Admitting: Internal Medicine

## 2021-12-24 ENCOUNTER — Other Ambulatory Visit (HOSPITAL_BASED_OUTPATIENT_CLINIC_OR_DEPARTMENT_OTHER): Payer: Self-pay

## 2021-12-24 VITALS — BP 132/78 | HR 71 | Ht 73.0 in | Wt 220.0 lb

## 2021-12-24 DIAGNOSIS — I209 Angina pectoris, unspecified: Secondary | ICD-10-CM

## 2021-12-24 DIAGNOSIS — I4819 Other persistent atrial fibrillation: Secondary | ICD-10-CM

## 2021-12-24 DIAGNOSIS — Z950 Presence of cardiac pacemaker: Secondary | ICD-10-CM

## 2021-12-24 DIAGNOSIS — I483 Typical atrial flutter: Secondary | ICD-10-CM | POA: Insufficient documentation

## 2021-12-24 DIAGNOSIS — I495 Sick sinus syndrome: Secondary | ICD-10-CM | POA: Insufficient documentation

## 2021-12-24 LAB — CUP PACEART INCLINIC DEVICE CHECK
Date Time Interrogation Session: 20231026115732
Implantable Lead Connection Status: 753985
Implantable Lead Connection Status: 753985
Implantable Lead Implant Date: 20170417
Implantable Lead Implant Date: 20170417
Implantable Lead Location: 753859
Implantable Lead Location: 753860
Implantable Lead Model: 7740
Implantable Lead Model: 7741
Implantable Lead Serial Number: 662696
Implantable Lead Serial Number: 751382
Implantable Pulse Generator Implant Date: 20170417
Lead Channel Impedance Value: 676 Ohm
Lead Channel Impedance Value: 709 Ohm
Lead Channel Pacing Threshold Amplitude: 0.8 V
Lead Channel Pacing Threshold Amplitude: 0.9 V
Lead Channel Pacing Threshold Pulse Width: 0.4 ms
Lead Channel Pacing Threshold Pulse Width: 0.5 ms
Lead Channel Sensing Intrinsic Amplitude: 1 mV
Lead Channel Sensing Intrinsic Amplitude: 14.4 mV
Lead Channel Setting Pacing Amplitude: 2.5 V
Lead Channel Setting Pacing Amplitude: 2.5 V
Lead Channel Setting Pacing Pulse Width: 0.4 ms
Lead Channel Setting Sensing Sensitivity: 2.5 mV
Pulse Gen Serial Number: 718418
Zone Setting Status: 755011

## 2021-12-24 MED ORDER — DIGOXIN 125 MCG PO TABS
0.1250 mg | ORAL_TABLET | Freq: Every day | ORAL | 3 refills | Status: DC
  Filled 2021-12-24: qty 90, 90d supply, fill #0
  Filled 2022-03-15: qty 90, 90d supply, fill #1
  Filled 2022-06-03: qty 90, 90d supply, fill #2
  Filled 2022-09-06: qty 90, 90d supply, fill #3

## 2021-12-24 MED ORDER — POTASSIUM CHLORIDE CRYS ER 20 MEQ PO TBCR
EXTENDED_RELEASE_TABLET | Freq: Every day | ORAL | 3 refills | Status: DC
  Filled 2021-12-24: qty 90, 90d supply, fill #0
  Filled 2022-03-15: qty 90, 90d supply, fill #1
  Filled 2022-06-03: qty 90, 90d supply, fill #2
  Filled 2022-09-06: qty 90, 90d supply, fill #3

## 2021-12-24 MED ORDER — APIXABAN 5 MG PO TABS
ORAL_TABLET | Freq: Two times a day (BID) | ORAL | 1 refills | Status: DC
  Filled 2021-12-24: qty 180, 90d supply, fill #0
  Filled 2022-03-15: qty 180, 90d supply, fill #1

## 2021-12-24 NOTE — Patient Instructions (Addendum)
Medication Instructions:  Your physician recommends that you continue on your current medications as directed. Please refer to the Current Medication list given to you today.  *If you need a refill on your cardiac medications before your next appointment, please call your pharmacy*  Lab Work: None ordered.  If you have labs (blood work) drawn today and your tests are completely normal, you will receive your results only by: [place] (if you have MyChart) OR A paper copy in the mail If you have any lab test that is abnormal or we need to change your treatment, we will call you to review the results.  Testing/Procedures: None ordered.  Follow-Up: At [facility], you and your health needs are our priority.  As part of our continuing mission to provide you with exceptional heart care, we have created designated Provider Care Teams.  These Care Teams include your primary Cardiologist (physician) and Advanced Practice Providers (APPs -  Physician Assistants and Nurse Practitioners) who all work together to provide you with the care you need, when you need it.  We recommend signing up for the patient portal called "MyChart".  Sign up information is provided on this After Visit Summary.  MyChart is used to connect with patients for Virtual Visits (Telemedicine).  Patients are able to view lab/test results, encounter notes, upcoming appointments, etc.  Non-urgent messages can be sent to your provider as well.   To learn more about what you can do with MyChart, go to [email or web address].    Your next appointment:   6 Month follow up with Dr. Cristopher Peru  The format for your next appointment:   In Person  Provider:   Cristopher Peru, MD{or one of the following Advanced Practice Providers on your designated Care Team:   Tommye Standard, [place] Legrand Como "Jonni Sanger" Chalmers Cater, [place]  Remote monitoring is used to monitor your Pacemaker from home. This monitoring reduces the number of office visits  required to check your device to one time per year. It allows [place] to keep an eye on the functioning of your device to ensure it is working properly. You are scheduled for a device check from home on 03/05/22. You may send your transmission at any time that day. If you have a wireless device, the transmission will be sent automatically. After your physician reviews your transmission, you will receive a postcard with your next transmission date.  Important Information About Sugar

## 2021-12-24 NOTE — Telephone Encounter (Signed)
Prescription refill request for Eliquis received. Indication: Afib  Last office visit: 12/24/21 Lovena Le)  Scr: 1.0 (11/10/21) Age: 87 Weight: 99.8kg  Appropriate dose and refill sent to requested pharmacy.

## 2021-12-24 NOTE — Progress Notes (Signed)
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 He is a pleasant 87 yo man with persistent atrial fib, sinus node dysfunction, s/p PPM insertion, AS s/p AVR, HTN, and CAD. He has also had atrial flutter with a RVR coming from the [place]. He most recently has had atrial fib with a fairly slow VR and ventricular pacing. We had adjusted his beta blocker down and the amount of pacing is reduced. He notes that he gets tired easily. He denies worsening chest pain. He has class 2 dyspnea. He was prescribed midodrine for occaisional episodic low bp.  Allergies  Allergen Reactions   Contrast Media [Iodinated Contrast Media] Hives   Gadolinium Derivatives Hives   Iodine-131 Hives   Metrizamide Hives   Tetanus Toxoids      Current Outpatient Medications  Medication Sig Dispense Refill   apixaban (ELIQUIS) 5 MG TABS tablet TAKE 1 TABLET BY MOUTH 2 TIMES DAILY. (Patient taking differently: Take 5 mg by mouth 2 (two) times daily.) 180 tablet 2   butalbital-acetaminophen-caffeine (FIORICET) 50-325-40 MG tablet Take 1 tablet by mouth twice daily as needed for headache. 30 tablet 5   Cholecalciferol (VITAMIN D3) 2000 units capsule Take 2,000 Units by mouth daily.     cilostazol (PLETAL) 100 MG tablet Take 1 tablet (100 mg total) by mouth 2 (two) times daily. 180 tablet 3   Coenzyme Q10 200 MG TABS Take 200 mg by mouth daily.     desoximetasone (TOPICORT) 0.25 % cream Apply 1 application topically 2 (two) times daily. (Patient taking differently: Apply 1 application  topically 2 (two) times daily as needed (rash or irritation).) 60 g 0   diclofenac Sodium (VOLTAREN) 1 % GEL Apply 2 g topically 2 (two) times daily as needed (knee pain).     digoxin (LANOXIN) 0.125 MG tablet Take 1 tablet (0.125 mg total) by mouth daily. 90 tablet 3   docusate sodium (COLACE) 100 MG capsule Take 100 mg by mouth 2 (two) times daily.     doxazosin (CARDURA) 4 MG tablet 1 tablet PO QD 90 tablet 3   ezetimibe (ZETIA) 10 MG tablet  Take 1 tablet (10 mg total) by mouth daily. 90 tablet 3   fluorouracil (EFUDEX) 5 % cream 1 application Externally Once a day 21 days 40 g 0   furosemide (LASIX) 20 MG tablet Take 1 tablet (20 mg total) by mouth 3 (three) times a week. 30 tablet 2   HYDROcodone-acetaminophen (NORCO) 7.5-325 MG tablet Take 1 tablet by mouth 3 (three) times daily. 90 tablet 0   LORazepam (ATIVAN) 1 MG tablet TAKE 1 TABLET BY MOUTH AT BEDTIME. MAY ALSO TAKE 1/2 TABLET DAILY AS NEEDED FOR ANXIETY. 45 tablet 5   magnesium oxide (MAG-OX) 400 MG tablet Take 400 mg by mouth daily.     metoprolol succinate (TOPROL-XL) 50 MG 24 hr tablet Take 1 tablet (50 mg total) by mouth 2 (two) times daily. 180 tablet 3   midodrine (PROAMATINE) 2.5 MG tablet Take 1 tablet (2.5 mg total) by mouth 2 (two) times daily with a meal. 60 tablet 3   mometasone (ELOCON) 0.1 % cream Apply 1 Application topically daily. 15 g 0   Multiple Vitamins-Minerals (MULTIVITAMIN WITH MINERALS) tablet Take 1 tablet by mouth daily.     naloxegol oxalate (MOVANTIK) 12.5 MG TABS tablet Take 1 tablet (12.5 mg total) by mouth daily. 30 tablet 0   nitroGLYCERIN (NITROSTAT) 0.4 MG SL tablet PLACE 1 TABLET UNDER THE TONGUE EVERY 5  MINUTES AS NEEDED FOR CHEST PAIN. 25 tablet 5   NON FORMULARY Zegrid Take one tablet by mouth once daily as needed.     nystatin (MYCOSTATIN) 100000 UNIT/ML suspension Take 5 mLs (500,000 Units total) by mouth 4 (four) times daily. 60 mL 1   pantoprazole (PROTONIX) 40 MG tablet TAKE 1 TABLET BY MOUTH DAILY. 90 tablet 3   polyethylene glycol (MIRALAX / GLYCOLAX) 17 g packet Take 17 g by mouth daily.     potassium chloride SA (KLOR-CON M) 20 MEQ tablet TAKE 1 TABLET BY MOUTH DAILY. (Patient taking differently: Take 20 mEq by mouth daily as needed. With Lasix) 90 tablet 2   predniSONE (DELTASONE) 10 MG tablet Take 1 tablet (10 mg total) by mouth daily with breakfast. 90 tablet 1   rosuvastatin (CRESTOR) 20 MG tablet TAKE 1 TABLET (20 MG TOTAL)  BY MOUTH AT BEDTIME. (Patient taking differently: Take 20 mg by mouth at bedtime.) 90 tablet 3   valACYclovir (VALTREX) 500 MG tablet Take 1 tablet twice a day for 7 days 14 tablet 0   amLODipine (NORVASC) 2.5 MG tablet Take 1 tablet (2.5 mg total) by mouth at bedtime. (Patient not taking: Reported on 11/25/2021) 90 tablet 3   No current facility-administered medications for this visit.     Past Medical History:  Diagnosis Date   Aortic stenosis    moderate aortic stenosis   Arthritis    Benign prostatic hypertrophy    Chronic systolic heart failure (HCC)    Chronotropic incompetence with sinus node dysfunction (HCC)    Status post Guidant dual-mode, dual-pacing, dual-sensing  pacemaker   implantation now programmed to AAI with recent generator change.   Coronary artery disease    status post multiple prior percutaneous coronary interventions, microvascular angina per Dr Olevia Perches   Diverticulitis sigmoid colon recurrent    Dysfunctional autonomic nervous system    Dyspnea    Heart murmur    History of primary hypertension    Hypercoagulable state ([place])    chronically anticoagulated with coumadin   Hyperlipidemia    Hyperthyroidism    Hypothyroidism    Dr. 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 Lia Foyer,    Prediabetes 09/21/2017   Stroke ([place])    1990    ROS:   All systems reviewed and negative except as noted in the HPI.   Past Surgical History:  Procedure Laterality Date   AORTIC VALVE REPLACEMENT  03/15/2011   Procedure: AORTIC VALVE REPLACEMENT (AVR);  Surgeon: Gaye Pollack, MD;  Location: [facility];  Service: Open Heart Surgery;  Laterality: N/A;   APPENDECTOMY     CARDIAC CATHETERIZATION     13   CARDIOVERSION     CARDIOVERSION  04/15/2011   Procedure: CARDIOVERSION;  Surgeon: Loralie Champagne, MD;  Location: [facility];  Service:  Cardiovascular;  Laterality: N/A;   CARDIOVERSION N/A 09/11/2014   Procedure: CARDIOVERSION;  Surgeon: Sueanne Margarita, MD;  Location: [facility];  Service: Cardiovascular;  Laterality: N/A;   CARDIOVERSION N/A 06/27/2015   Procedure: CARDIOVERSION;  Surgeon: Thayer Headings, MD;  Location: [facility];  Service: Cardiovascular;  Laterality: N/A;   CARDIOVERSION N/A 07/04/2015   Procedure: CARDIOVERSION;  Surgeon: Evans Lance, MD;  Location: [facility];  Service: Cardiovascular;  Laterality: N/A;   CARDIOVERSION N/A 04/13/2017   Procedure: CARDIOVERSION;  Surgeon: Sueanne Margarita,  MD;  Location: [facility];  Service: Cardiovascular;  Laterality: N/A;   COLONOSCOPY     CORONARY STENT INTERVENTION N/A 10/15/2019   Procedure: CORONARY STENT INTERVENTION;  Surgeon: Jettie Booze, MD;  Location: [facility];  Service: Cardiovascular;  Laterality: N/A;   EP IMPLANTABLE DEVICE N/A 06/16/2015   Procedure: Pacemaker Implant;  Surgeon: Evans Lance, MD;  Location: [facility];  Service: Cardiovascular;  Laterality: N/A;   hemrrhoidectomy     LEFT AND RIGHT HEART CATHETERIZATION WITH CORONARY ANGIOGRAM Bilateral 02/01/2011   Procedure: LEFT AND RIGHT HEART CATHETERIZATION WITH CORONARY ANGIOGRAM;  Surgeon: Hillary Bow, MD;  Location: [facility];  Service: Cardiovascular;  Laterality: Bilateral;   LEFT HEART CATH AND CORONARY ANGIOGRAPHY N/A 10/15/2019   Procedure: LEFT HEART CATH AND CORONARY ANGIOGRAPHY;  Surgeon: Jettie Booze, MD;  Location: [facility];  Service: Cardiovascular;  Laterality: N/A;   LEFT HEART CATH AND CORONARY ANGIOGRAPHY N/A 10/07/2020   Procedure: LEFT HEART CATH AND CORONARY ANGIOGRAPHY;  Surgeon: Sherren Mocha, MD;  Location: [facility];  Service: Cardiovascular;  Laterality: N/A;   LUMBAR LAMINECTOMY/DECOMPRESSION MICRODISCECTOMY Right 02/23/2021   Procedure: Right Lumbar four-five microdiscectomy;  Surgeon: Eustace Moore, MD;  Location: [facility];  Service: Neurosurgery;  Laterality: Right;   MAZE  03/15/2011   Procedure: MAZE;  Surgeon: Gaye Pollack, MD;  Location: [facility];  Service: Open Heart Surgery;  Laterality: N/A;   MOHS SURGERY  09/2021   PACEMAKER INSERTION  1991   Guidant PPM, most recent Generator Change by Dr Olevia Perches was 08/22/06   RIGHT/LEFT HEART CATH AND CORONARY ANGIOGRAPHY N/A 07/06/2016   Procedure: Right/Left Heart Cath and Coronary Angiography;  Surgeon: Sherren Mocha, MD;  Location: [facility];  Service: Cardiovascular;  Laterality: N/A;   TEE WITHOUT CARDIOVERSION  04/15/2011   Procedure: TRANSESOPHAGEAL ECHOCARDIOGRAM (TEE);  Surgeon: Loralie Champagne, MD;  Location: [facility];  Service: Cardiovascular;  Laterality: N/A;   TEE WITHOUT CARDIOVERSION N/A 09/11/2014   Procedure: TRANSESOPHAGEAL ECHOCARDIOGRAM (TEE);  Surgeon: Sueanne Margarita, MD;  Location: [facility];  Service: Cardiovascular;  Laterality: N/A;     Family History  Problem Relation Age of Onset   Heart disease Brother        Twin brother has coronary disease and recent AVR for AS   CAD Brother    Atrial fibrillation Brother    Sarcoidosis Brother    Depression Daughter    Anorexia nervosa Daughter    Hypertension Son    Anesthesia problems Neg Hx    Hypotension Neg Hx    Malignant hyperthermia Neg Hx    Pseudochol deficiency Neg Hx      Social History   Socioeconomic History   Marital status: Married    Spouse name: Baker Janus    Number of children: 3   Years of education: Not on file   Highest education level: Not on file  Occupational History   Occupation: Retired    Comment: Physician  Tobacco Use   Smoking status: Never   Smokeless tobacco: Never  Vaping Use   Vaping Use: Never used  Substance and Sexual Activity   Alcohol use: No    Alcohol/week: 0.0 standard drinks of alcohol   Drug use: No   Sexual activity: Not Currently  Other Topics Concern   Not on file  Social History  Narrative   Married to [place]. Has grown children   Retired Horticulturist, commercial MD  Never smoker no alcohol      Social Determinants of Radio broadcast assistant Strain: Not on file  Food Insecurity: Not on file  Transportation Needs: Not on file  Physical Activity: Not on file  Stress: Not on file  Social Connections: Not on file  Intimate Partner Violence: Not on file     BP 132/78   Pulse 71   Ht '6\' 1"'$  (1.854 m)   Wt 220 lb (99.8 kg)   SpO2 93%   BMI 29.03 kg/m   Physical Exam:  Well appearing elderly man, NAD HEENT: Unremarkable Neck:  No JVD, no thyromegally Lymphatics:  No adenopathy Back:  No CVA tenderness Lungs:  Clear with no wheezes HEART:  Regular rate rhythm, no murmurs, no rubs, no clicks Abd:  soft, positive bowel sounds, no organomegally, no rebound, no guarding Ext:  2 plus pulses, no edema, no cyanosis, no clubbing Skin:  No rashes no nodules Neuro:  CN II through XII intact, motor grossly intact   DEVICE  Normal device function.  See PaceArt for details.   Assess/Plan:   Atrial fib and flutter - his VR is controlled and his rate is improved after adjustment of his meds.  HTN - his bp is well controlled. I asked him to keep tract and let us know if it goes too low. PPM - his [employer] DDD PM is working normally. His VR is a little slower than usual. CAD - his angina is improved. He will continue his current meds.   Carleene Overlie Phelicia Dantes,MD

## 2021-12-25 ENCOUNTER — Other Ambulatory Visit (HOSPITAL_BASED_OUTPATIENT_CLINIC_OR_DEPARTMENT_OTHER): Payer: Self-pay

## 2022-01-04 ENCOUNTER — Other Ambulatory Visit (HOSPITAL_BASED_OUTPATIENT_CLINIC_OR_DEPARTMENT_OTHER): Payer: Self-pay

## 2022-01-11 ENCOUNTER — Encounter: Payer: Medicare Other | Admitting: Adult Health

## 2022-01-12 ENCOUNTER — Other Ambulatory Visit: Payer: Self-pay | Admitting: Cardiovascular Disease

## 2022-01-12 ENCOUNTER — Other Ambulatory Visit (HOSPITAL_BASED_OUTPATIENT_CLINIC_OR_DEPARTMENT_OTHER): Payer: Self-pay

## 2022-01-12 MED ORDER — ROSUVASTATIN CALCIUM 20 MG PO TABS
20.0000 mg | ORAL_TABLET | Freq: Every day | ORAL | 0 refills | Status: DC
  Filled 2022-01-12 – 2022-01-18 (×2): qty 90, 90d supply, fill #0

## 2022-01-13 ENCOUNTER — Other Ambulatory Visit (HOSPITAL_BASED_OUTPATIENT_CLINIC_OR_DEPARTMENT_OTHER): Payer: Self-pay

## 2022-01-14 ENCOUNTER — Other Ambulatory Visit (HOSPITAL_BASED_OUTPATIENT_CLINIC_OR_DEPARTMENT_OTHER): Payer: Self-pay

## 2022-01-15 ENCOUNTER — Other Ambulatory Visit (HOSPITAL_BASED_OUTPATIENT_CLINIC_OR_DEPARTMENT_OTHER): Payer: Self-pay

## 2022-01-15 MED ORDER — PANTOPRAZOLE SODIUM 40 MG PO TBEC
40.0000 mg | DELAYED_RELEASE_TABLET | Freq: Two times a day (BID) | ORAL | 3 refills | Status: DC
  Filled 2022-01-15 – 2022-02-15 (×3): qty 180, 90d supply, fill #0
  Filled 2022-05-09: qty 180, 90d supply, fill #1
  Filled 2022-09-06: qty 180, 90d supply, fill #2
  Filled 2022-12-08: qty 180, 90d supply, fill #3

## 2022-01-15 NOTE — Telephone Encounter (Signed)
Per OV note by Burt Knack on 11/25/21:  Tolerating oral anticoagulation without significant bleeding problems.  GI prophylaxis with twice daily Protonix   Will send new Rx to pharmacy on file for BID dosing.

## 2022-01-16 ENCOUNTER — Other Ambulatory Visit (HOSPITAL_BASED_OUTPATIENT_CLINIC_OR_DEPARTMENT_OTHER): Payer: Self-pay

## 2022-01-18 ENCOUNTER — Other Ambulatory Visit: Payer: Self-pay | Admitting: Internal Medicine

## 2022-01-18 ENCOUNTER — Other Ambulatory Visit: Payer: Self-pay | Admitting: Adult Health

## 2022-01-18 ENCOUNTER — Other Ambulatory Visit (HOSPITAL_BASED_OUTPATIENT_CLINIC_OR_DEPARTMENT_OTHER): Payer: Self-pay

## 2022-01-18 DIAGNOSIS — Z23 Encounter for immunization: Secondary | ICD-10-CM | POA: Diagnosis not present

## 2022-01-18 MED ORDER — HYDROCODONE-ACETAMINOPHEN 7.5-325 MG PO TABS
1.0000 | ORAL_TABLET | Freq: Three times a day (TID) | ORAL | 0 refills | Status: DC
  Filled 2022-01-18: qty 90, 30d supply, fill #0

## 2022-01-18 MED ORDER — FLUAD QUADRIVALENT 0.5 ML IM PRSY
PREFILLED_SYRINGE | INTRAMUSCULAR | 0 refills | Status: DC
  Filled 2022-01-18: qty 0.5, 1d supply, fill #0

## 2022-01-18 NOTE — Telephone Encounter (Signed)
Patient has request refill on medication Hydrocodone. Patient last refill dated 12/14/2021. Patient has Opioid Contract on file dated 10/16/2021.Medication pend and sent to PCP [name], MD for approval.

## 2022-01-19 ENCOUNTER — Other Ambulatory Visit (HOSPITAL_COMMUNITY): Payer: Self-pay

## 2022-01-19 ENCOUNTER — Other Ambulatory Visit (HOSPITAL_BASED_OUTPATIENT_CLINIC_OR_DEPARTMENT_OTHER): Payer: Self-pay

## 2022-01-19 MED ORDER — NALOXEGOL OXALATE 12.5 MG PO TABS
12.5000 mg | ORAL_TABLET | Freq: Every day | ORAL | 0 refills | Status: DC
  Filled 2022-01-19: qty 30, 30d supply, fill #0

## 2022-01-19 NOTE — Telephone Encounter (Signed)
According to test billing this medication is covered, and the patient should not need a PA for this medication.

## 2022-01-25 ENCOUNTER — Other Ambulatory Visit (HOSPITAL_BASED_OUTPATIENT_CLINIC_OR_DEPARTMENT_OTHER): Payer: Self-pay

## 2022-01-26 ENCOUNTER — Other Ambulatory Visit (HOSPITAL_BASED_OUTPATIENT_CLINIC_OR_DEPARTMENT_OTHER): Payer: Self-pay

## 2022-01-28 ENCOUNTER — Other Ambulatory Visit (HOSPITAL_BASED_OUTPATIENT_CLINIC_OR_DEPARTMENT_OTHER): Payer: Self-pay

## 2022-01-29 ENCOUNTER — Other Ambulatory Visit (HOSPITAL_BASED_OUTPATIENT_CLINIC_OR_DEPARTMENT_OTHER): Payer: Self-pay

## 2022-02-04 DIAGNOSIS — D485 Neoplasm of uncertain behavior of skin: Secondary | ICD-10-CM | POA: Diagnosis not present

## 2022-02-04 DIAGNOSIS — C4442 Squamous cell carcinoma of skin of scalp and neck: Secondary | ICD-10-CM | POA: Diagnosis not present

## 2022-02-04 DIAGNOSIS — L578 Other skin changes due to chronic exposure to nonionizing radiation: Secondary | ICD-10-CM | POA: Diagnosis not present

## 2022-02-04 DIAGNOSIS — L570 Actinic keratosis: Secondary | ICD-10-CM | POA: Diagnosis not present

## 2022-02-08 ENCOUNTER — Other Ambulatory Visit (HOSPITAL_BASED_OUTPATIENT_CLINIC_OR_DEPARTMENT_OTHER): Payer: Self-pay

## 2022-02-14 ENCOUNTER — Other Ambulatory Visit: Payer: Self-pay

## 2022-02-15 ENCOUNTER — Other Ambulatory Visit: Payer: Self-pay | Admitting: Internal Medicine

## 2022-02-15 ENCOUNTER — Other Ambulatory Visit (HOSPITAL_BASED_OUTPATIENT_CLINIC_OR_DEPARTMENT_OTHER): Payer: Self-pay

## 2022-02-15 ENCOUNTER — Other Ambulatory Visit: Payer: Self-pay

## 2022-02-15 MED ORDER — MAGNESIUM OXIDE 400 MG PO TABS
400.0000 mg | ORAL_TABLET | Freq: Every day | ORAL | 1 refills | Status: DC
  Filled 2022-02-15: qty 90, 90d supply, fill #0
  Filled 2022-06-03 (×2): qty 120, 120d supply, fill #0
  Filled 2022-10-11 – 2022-12-16 (×4): qty 120, 120d supply, fill #1

## 2022-02-15 NOTE — Telephone Encounter (Signed)
Patient has request refill on medication that hasn't been sent in by PCP [name], MD

## 2022-02-16 ENCOUNTER — Encounter: Payer: Self-pay | Admitting: Internal Medicine

## 2022-02-16 ENCOUNTER — Non-Acute Institutional Stay: Payer: Medicare Other | Admitting: Internal Medicine

## 2022-02-16 ENCOUNTER — Other Ambulatory Visit (HOSPITAL_BASED_OUTPATIENT_CLINIC_OR_DEPARTMENT_OTHER): Payer: Self-pay

## 2022-02-16 VITALS — BP 158/88 | HR 62 | Temp 97.8°F | Resp 18 | Ht 73.0 in | Wt 219.5 lb

## 2022-02-16 DIAGNOSIS — I4819 Other persistent atrial fibrillation: Secondary | ICD-10-CM

## 2022-02-16 DIAGNOSIS — E785 Hyperlipidemia, unspecified: Secondary | ICD-10-CM

## 2022-02-16 DIAGNOSIS — M158 Other polyosteoarthritis: Secondary | ICD-10-CM

## 2022-02-16 DIAGNOSIS — I25118 Atherosclerotic heart disease of native coronary artery with other forms of angina pectoris: Secondary | ICD-10-CM

## 2022-02-16 DIAGNOSIS — R600 Localized edema: Secondary | ICD-10-CM | POA: Diagnosis not present

## 2022-02-16 DIAGNOSIS — D472 Monoclonal gammopathy: Secondary | ICD-10-CM

## 2022-02-16 DIAGNOSIS — N401 Enlarged prostate with lower urinary tract symptoms: Secondary | ICD-10-CM | POA: Diagnosis not present

## 2022-02-16 DIAGNOSIS — G894 Chronic pain syndrome: Secondary | ICD-10-CM

## 2022-02-16 DIAGNOSIS — K5903 Drug induced constipation: Secondary | ICD-10-CM

## 2022-02-16 DIAGNOSIS — H34231 Retinal artery branch occlusion, right eye: Secondary | ICD-10-CM | POA: Diagnosis not present

## 2022-02-16 DIAGNOSIS — K219 Gastro-esophageal reflux disease without esophagitis: Secondary | ICD-10-CM | POA: Diagnosis not present

## 2022-02-16 DIAGNOSIS — E1159 Type 2 diabetes mellitus with other circulatory complications: Secondary | ICD-10-CM

## 2022-02-16 DIAGNOSIS — I10 Essential (primary) hypertension: Secondary | ICD-10-CM

## 2022-02-16 MED ORDER — BUTALBITAL-APAP-CAFFEINE 50-325-40 MG PO TABS
ORAL_TABLET | ORAL | 5 refills | Status: DC
  Filled 2022-03-18: qty 45, 23d supply, fill #0
  Filled 2022-04-20: qty 45, 23d supply, fill #1
  Filled 2022-05-09: qty 45, 23d supply, fill #2
  Filled 2022-06-03: qty 45, 23d supply, fill #3
  Filled 2022-07-04 – 2022-07-05 (×2): qty 45, 23d supply, fill #4
  Filled 2022-08-02: qty 45, 23d supply, fill #5

## 2022-02-17 NOTE — Progress Notes (Signed)
Location:  [place] of Service:  Clinic (12)  Provider:   Code Status:  Goals of Care:     04/20/2021    9:24 AM  Advanced Directives  Does Patient Have a Medical Advance Directive? Yes  Type of Advance Directive Living will  Does patient want to make changes to medical advance directive? No - Guardian declined     Chief Complaint  Patient presents with   Medical Management of Chronic Issues    3 month follow up.    HPI: Patient is a 87 y.o. male seen today for medical management of chronic diseases.    Lives in [place] with his Wife Now has Personal nurse who helps with meds   Diabetes New Diagnosis A1C 7.4 He does not want any medications and want to modify his diet  Repeat A1C with Dr Benay Spice when he goes for blood work  Lower extremity edema He did take Lasix but then the blood pressure became low and had to hold the amlodipine.   Off Lasix and Amlodipine per Dr Burt Knack   Severe arthritis Pain seems to be controlled with Norco and prednisone 5 mg  Abdominal pain The CT was negative and pain is probably due to constipation so he  was just started on Movantik by Dr. Carlean Purl  Not taking Movantik Using Miralax     Has h/o CAD, s/p Stent Distal RCA Stent Cath in 09/2019 Repeat Cath Done in 08/22 for Anginal Symptoms. Stents were open  Follows very Closely with Cardiology    H/o Atrial Flutter on Eliquis Due to high runs of A Flutter Digoxin was added  Sinus Node Dysfunction s/p PPM   Orthostatic Hypotension   Severe OsteoArthritis on Norco Prn  and Prednisone PRN  Adrenal Insufficiency per Dr Renne Crigler due to Prolong Steroid use for Osteoarthritis  on Chronic Prednisone   MGUS Follows with Dr Benay Spice for  Labs BPH  Chronic Fatigue and Weakness Underwent R L4-5 hemilaminectomy with diskectomy on 12/26 for severe back pain radiating to right lower extremity Chronic Dizziness   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 Follows  with Dr Leonie Man Annually carotid ultrasound which did not show any significant stenosis 2D echo which showed a EF of 60% with inferior wall hypokinesis   Headache Wants to take Fioricet every day almost  Past Medical History:  Diagnosis Date   Aortic stenosis    moderate aortic stenosis   Arthritis    Benign prostatic hypertrophy    Chronic systolic heart failure (HCC)    Chronotropic incompetence with sinus node dysfunction (HCC)    Status post Guidant dual-mode, dual-pacing, dual-sensing  pacemaker   implantation now programmed to AAI with recent generator change.   Coronary artery disease    status post multiple prior percutaneous coronary interventions, microvascular angina per Dr Olevia Perches   Diverticulitis sigmoid colon recurrent    Dysfunctional autonomic nervous system    Dyspnea    Heart murmur    History of primary hypertension    Hypercoagulable state ([place])    chronically anticoagulated with coumadin   Hyperlipidemia    Hyperthyroidism    Hypothyroidism    Dr. 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 Lia Foyer,    Prediabetes 09/21/2017   Stroke ([place])    1990    Past Surgical History:  Procedure Laterality Date  AORTIC VALVE REPLACEMENT  03/15/2011   Procedure: AORTIC VALVE REPLACEMENT (AVR);  Surgeon: Gaye Pollack, MD;  Location: [facility];  Service: Open Heart Surgery;  Laterality: N/A;   APPENDECTOMY     CARDIAC CATHETERIZATION     11   CARDIOVERSION     CARDIOVERSION  04/15/2011   Procedure: CARDIOVERSION;  Surgeon: Loralie Champagne, MD;  Location: [place];  Service: Cardiovascular;  Laterality: N/A;   CARDIOVERSION N/A 09/11/2014   Procedure: CARDIOVERSION;  Surgeon: Sueanne Margarita, MD;  Location: [facility];  Service: Cardiovascular;  Laterality: N/A;   CARDIOVERSION N/A 06/27/2015   Procedure: CARDIOVERSION;  Surgeon: Thayer Headings, MD;  Location: [facility];  Service: Cardiovascular;  Laterality: N/A;   CARDIOVERSION N/A 07/04/2015   Procedure: CARDIOVERSION;  Surgeon: Evans Lance, MD;  Location: [place];  Service: Cardiovascular;  Laterality: N/A;   CARDIOVERSION N/A 04/13/2017   Procedure: CARDIOVERSION;  Surgeon: Sueanne Margarita, MD;  Location[facility];  Service: Cardiovascular;  Laterality: N/A;   COLONOSCOPY     CORONARY STENT INTERVENTION N/A 10/15/2019   Procedure: CORONARY STENT INTERVENTION;  Surgeon: Jettie Booze, MD;  Location: [place] CV LAB;  Service: Cardiovascular;  Laterality: N/A;   EP IMPLANTABLE DEVICE N/A 06/16/2015   Procedure: Pacemaker Implant;  Surgeon: Evans Lance, MD;  Location: [place] CV LAB;  Service: Cardiovascular;  Laterality: N/A;   hemrrhoidectomy     LEFT AND RIGHT HEART CATHETERIZATION WITH CORONARY ANGIOGRAM Bilateral 02/01/2011   Procedure: LEFT AND RIGHT HEART CATHETERIZATION WITH CORONARY ANGIOGRAM;  Surgeon: Hillary Bow, MD;  Location: [facility] CATH LAB;  Service: Cardiovascular;  Laterality: Bilateral;   LEFT HEART CATH AND CORONARY ANGIOGRAPHY N/A 10/15/2019   Procedure: LEFT HEART CATH AND CORONARY ANGIOGRAPHY;  Surgeon: Jettie Booze, MD;  Location: [place] CV LAB;  Service: Cardiovascular;  Laterality: N/A;   LEFT HEART CATH AND CORONARY ANGIOGRAPHY N/A 10/07/2020   Procedure: LEFT HEART CATH AND CORONARY ANGIOGRAPHY;  Surgeon: Sherren Mocha, MD;  Location: [place] CV LAB;  Service: Cardiovascular;  Laterality: N/A;   LUMBAR LAMINECTOMY/DECOMPRESSION MICRODISCECTOMY Right 02/23/2021   Procedure: Right Lumbar four-five microdiscectomy;  Surgeon: Eustace Moore, MD;  Location: [place];  Service: Neurosurgery;  Laterality: Right;   MAZE  03/15/2011   Procedure: MAZE;  Surgeon: Gaye Pollack, MD;  Location: [place];  Service: Open Heart Surgery;  Laterality: N/A;   MOHS SURGERY  09/2021   PACEMAKER INSERTION  1991   Guidant  PPM, most recent Generator Change by Dr Olevia Perches was 08/22/06   RIGHT/LEFT HEART CATH AND CORONARY ANGIOGRAPHY N/A 07/06/2016   Procedure: Right/Left Heart Cath and Coronary Angiography;  Surgeon: Sherren Mocha, MD;  Location: [place] CV LAB;  Service: Cardiovascular;  Laterality: N/A;   TEE WITHOUT CARDIOVERSION  04/15/2011   Procedure: TRANSESOPHAGEAL ECHOCARDIOGRAM (TEE);  Surgeon: Loralie Champagne, MD;  Location: [place];  Service: Cardiovascular;  Laterality: N/A;   TEE WITHOUT CARDIOVERSION N/A 09/11/2014   Procedure: TRANSESOPHAGEAL ECHOCARDIOGRAM (TEE);  Surgeon: Sueanne Margarita, MD;  Location: [facility] ENDOSCOPY;  Service: Cardiovascular;  Laterality: N/A;    Allergies  Allergen Reactions   Contrast Media [Iodinated Contrast Media] Hives   Gadolinium Derivatives Hives   Iodine-131 Hives   Metrizamide Hives   Tetanus Toxoids     Outpatient Encounter Medications as of 02/16/2022  Medication Sig   apixaban (ELIQUIS) 5 MG TABS tablet TAKE 1 TABLET BY MOUTH 2 TIMES DAILY.   butalbital-acetaminophen-caffeine (FIORICET) [phone]  MG tablet Take 1 tablet by mouth twice daily as needed for headache.   Cholecalciferol (VITAMIN D3) 2000 units capsule Take 2,000 Units by mouth daily.   cilostazol (PLETAL) 100 MG tablet Take 1 tablet (100 mg total) by mouth 2 (two) times daily.   Coenzyme Q10 200 MG TABS Take 200 mg by mouth daily.   desoximetasone (TOPICORT) 0.25 % cream Apply 1 application topically 2 (two) times daily. (Patient taking differently: Apply 1 application  topically 2 (two) times daily as needed (rash or irritation).)   diclofenac Sodium (VOLTAREN) 1 % GEL Apply 2 g topically 2 (two) times daily as needed (knee pain).   digoxin (LANOXIN) 0.125 MG tablet Take 1 tablet (0.125 mg total) by mouth daily.   docusate sodium (COLACE) 100 MG capsule Take 100 mg by mouth 2 (two) times daily.   doxazosin (CARDURA) 4 MG tablet 1 tablet PO QD   ezetimibe (ZETIA) 10 MG tablet Take 1 tablet  (10 mg total) by mouth daily.   fluorouracil (EFUDEX) 5 % cream 1 application Externally Once a day 21 days   HYDROcodone-acetaminophen (NORCO) 7.5-325 MG tablet Take 1 tablet by mouth 3 (three) times daily.   influenza vaccine adjuvanted (FLUAD QUADRIVALENT) 0.5 ML injection Inject into the muscle.   LORazepam (ATIVAN) 1 MG tablet TAKE 1 TABLET BY MOUTH AT BEDTIME. MAY ALSO TAKE 1/2 TABLET DAILY AS NEEDED FOR ANXIETY.   magnesium oxide (MAG-OX) 400 MG tablet Take 1 tablet (400 mg total) by mouth daily.   metoprolol succinate (TOPROL-XL) 50 MG 24 hr tablet Take 1 tablet (50 mg total) by mouth 2 (two) times daily.   midodrine (PROAMATINE) 2.5 MG tablet Take 1 tablet (2.5 mg total) by mouth 2 (two) times daily with a meal. (Patient taking differently: Take 2.5 mg by mouth 2 (two) times daily as needed.)   mometasone (ELOCON) 0.1 % cream Apply 1 Application topically daily.   Multiple Vitamins-Minerals (MULTIVITAMIN WITH MINERALS) tablet Take 1 tablet by mouth daily.   nitroGLYCERIN (NITROSTAT) 0.4 MG SL tablet PLACE 1 TABLET UNDER THE TONGUE EVERY 5 MINUTES AS NEEDED FOR CHEST PAIN.   pantoprazole (PROTONIX) 40 MG tablet Take 1 tablet (40 mg total) by mouth 2 (two) times daily.   polyethylene glycol (MIRALAX / GLYCOLAX) 17 g packet Take 17 g by mouth daily.   potassium chloride SA (KLOR-CON M) 20 MEQ tablet TAKE 1 TABLET BY MOUTH DAILY.   rosuvastatin (CRESTOR) 20 MG tablet Take 1 tablet (20 mg total) by mouth at bedtime. Please keep scheduled appointment for future refills. Thank you.   valACYclovir (VALTREX) 500 MG tablet Take 1 tablet twice a day for 7 days   butalbital-acetaminophen-caffeine (FIORICET) 50-325-40 MG tablet Take 1 tablet by mouth twice daily as needed for headache.   NON FORMULARY Zegrid Take one tablet by mouth once daily as needed. (Patient not taking: Reported on 02/16/2022)   predniSONE (DELTASONE) 10 MG tablet Take 1 tablet (10 mg total) by mouth daily with breakfast. (Patient  taking differently: Take 5 mg by mouth daily with breakfast.)   [DISCONTINUED] amLODipine (NORVASC) 2.5 MG tablet Take 1 tablet (2.5 mg total) by mouth at bedtime. (Patient not taking: Reported on 11/25/2021)   [DISCONTINUED] furosemide (LASIX) 20 MG tablet Take 1 tablet (20 mg total) by mouth 3 (three) times a week. (Patient not taking: Reported on 02/16/2022)   [DISCONTINUED] naloxegol oxalate (MOVANTIK) 12.5 MG TABS tablet Take 1 tablet (12.5 mg total) by mouth daily. (Patient not taking: Reported on  02/16/2022)   [DISCONTINUED] nystatin (MYCOSTATIN) 100000 UNIT/ML suspension Take 5 mLs (500,000 Units total) by mouth 4 (four) times daily. (Patient not taking: Reported on 02/16/2022)   [DISCONTINUED] pregabalin (LYRICA) 75 MG capsule Take 75 mg by mouth 2 (two) times daily.   No facility-administered encounter medications on file as of 02/16/2022.    Review of Systems:  Review of Systems  Constitutional:  Negative for activity change, appetite change and unexpected weight change.  HENT: Negative.    Respiratory:  Negative for cough and shortness of breath.   Cardiovascular:  Negative for leg swelling.  Gastrointestinal:  Positive for constipation.  Genitourinary:  Positive for frequency.  Musculoskeletal:  Positive for arthralgias, gait problem, myalgias and neck pain.  Skin: Negative.  Negative for rash.  Neurological:  Positive for headaches. Negative for dizziness and weakness.  Psychiatric/Behavioral:  Negative for confusion and sleep disturbance.   All other systems reviewed and are negative.   Health Maintenance  Topic Date Due   Medicare Annual Wellness (AWV)  Never done   Diabetic kidney evaluation - Urine ACR  Never done   DTaP/Tdap/Td (1 - Tdap) Never done   Zoster Vaccines- Shingrix (1 of 2) Never done   Pneumonia Vaccine 104+ Years old (1 - PCV) Never done   COVID-19 Vaccine (4 - 2023-24 season) 10/30/2021   Diabetic kidney evaluation - eGFR measurement  11/11/2022    INFLUENZA VACCINE  Completed   HPV VACCINES  Aged Out    Physical Exam: Vitals:   02/16/22 1023  BP: (!) 158/88  Pulse: 62  Resp: 18  Temp: 97.8 F (36.6 C)  TempSrc: Temporal  SpO2: 92%  Weight: 219 lb 8 oz (99.6 kg)  Height: _0  (1.854 m)   Body mass index is 28.96 kg/m. Physical Exam Vitals reviewed.  Constitutional:      Appearance: Normal appearance.  HENT:     Head: Normocephalic.     Nose: Nose normal.     Mouth/Throat:     Mouth: Mucous membranes are moist.     Pharynx: Oropharynx is clear.  Eyes:     Pupils: Pupils are equal, round, and reactive to light.  Cardiovascular:     Rate and Rhythm: Normal rate. Rhythm irregular.     Pulses: Normal pulses.     Heart sounds: No murmur heard. Pulmonary:     Effort: Pulmonary effort is normal. No respiratory distress.     Breath sounds: Normal breath sounds. No rales.  Abdominal:     General: Abdomen is flat. Bowel sounds are normal.     Palpations: Abdomen is soft.  Musculoskeletal:        General: No swelling.     Cervical back: Neck supple.  Skin:    General: Skin is warm.  Neurological:     General: No focal deficit present.     Mental Status: He is alert and oriented to person, place, and time.  Psychiatric:        Mood and Affect: Mood normal.        Thought Content: Thought content normal.     Labs reviewed: Basic Metabolic Panel: Recent Labs    04/12/21 1441 04/13/21 0109 08/06/21 1151 10/22/21 0000 11/10/21 0000  NA 139 136 140 139 143  K 4.4 3.9 4.3 4.1 4.3  CL 103 103 100 100 101  CO2  --  23 31 28* 28*  GLUCOSE 195* 204* 130*  --   --   BUN _1 --  CREATININE 1.00 1.03 1.16 1.0 1.0  CALCIUM  --  8.9 9.6 9.2 9.3   Liver Function Tests: Recent Labs    04/12/21 1429 08/06/21 1151  AST 26 21  ALT 19 26  ALKPHOS 82 88  BILITOT 0.6 0.6  PROT 7.5 7.2  7.5  ALBUMIN 4.0 4.1   No results for input(s): "LIPASE", "AMYLASE" in the last 8760 hours. No results for  input(s): "AMMONIA" in the last 8760 hours. CBC: Recent Labs    04/12/21 1429 04/12/21 1441 04/13/21 0109 08/06/21 1151 10/22/21 0000 11/10/21 0000  WBC 10.5  --  8.8 13.4* 7.3 9.0  NEUTROABS 9.5*  --  8.2* 11.2*  --   --   HGB 11.2*   < > 10.3* 11.6* 11.0* 11.3*  HCT 36.6*   < > 33.2* 35.6* 33* 34*  MCV 91.0  --  89.5 83.8  --   --   PLT 212  --  209 225.0 214 278   < > = values in this interval not displayed.   Lipid Panel: Recent Labs    04/13/21 0109  CHOL 131  HDL 62  LDLCALC 64  TRIG 27  CHOLHDL 2.1   Lab Results  Component Value Date   HGBA1C 7.4 10/22/2021    Procedures since last visit: No results found.  Assessment/Plan 1. Other osteoarthritis involving multiple joints Norco PRN per [place] by His Personal Nurse Refused Pain Clinic in the past Also on Prednisone 5 mg   2. Chronic pain syndrome Norco  3. Type 2 diabetes mellitus with other circulatory complication, without long-term current use of insulin ([place]) Diet control Will follow blood work A1C in Hematology  4. Hyperlipidemia, unspecified hyperlipidemia type LDL 49 in 02/23 on Crestor and Zetia  5. Retinal artery branch occlusion of right eye Pletal  6. Drug-induced constipation Miralax  7. MGUS (monoclonal gammopathy of unknown significance) Follows with Dr Benay Spice  8. Coronary artery disease of native artery of native heart with stable angina pectoris ([place]) Continues to have issue with Angina Nitro makes BP drop Not able to do much  S/p Multiple Caths Closely with Cardiologists On Statin   9. Essential hypertension Off Norvasc No Meds right now due to risk of Orthostatics Not taking Midodrine either 10. Benign prostatic hyperplasia with lower urinary tract symptoms, symptom details unspecified [facility] recommended I and O cath He refused due to risk of bleeding on Eliquis  11. Bilateral leg edema [place] conservatively Lasix caused Low BP  12.  Gastroesophageal reflux disease without esophagitis Protonix  13. Persistent atrial fibrillation (HCC) Eliquis 14 Headache Fioricet    Labs/tests ordered:   Next appt:  05/18/2022

## 2022-02-18 ENCOUNTER — Inpatient Hospital Stay: Payer: Medicare Other | Attending: Oncology

## 2022-02-18 DIAGNOSIS — D472 Monoclonal gammopathy: Secondary | ICD-10-CM | POA: Insufficient documentation

## 2022-02-18 DIAGNOSIS — I2510 Atherosclerotic heart disease of native coronary artery without angina pectoris: Secondary | ICD-10-CM | POA: Insufficient documentation

## 2022-02-18 DIAGNOSIS — D649 Anemia, unspecified: Secondary | ICD-10-CM | POA: Insufficient documentation

## 2022-02-18 DIAGNOSIS — Z952 Presence of prosthetic heart valve: Secondary | ICD-10-CM | POA: Diagnosis not present

## 2022-02-18 DIAGNOSIS — R768 Other specified abnormal immunological findings in serum: Secondary | ICD-10-CM | POA: Diagnosis not present

## 2022-02-18 DIAGNOSIS — K5792 Diverticulitis of intestine, part unspecified, without perforation or abscess without bleeding: Secondary | ICD-10-CM | POA: Diagnosis not present

## 2022-02-18 DIAGNOSIS — N400 Enlarged prostate without lower urinary tract symptoms: Secondary | ICD-10-CM | POA: Diagnosis not present

## 2022-02-18 DIAGNOSIS — I4891 Unspecified atrial fibrillation: Secondary | ICD-10-CM | POA: Insufficient documentation

## 2022-02-18 DIAGNOSIS — Z950 Presence of cardiac pacemaker: Secondary | ICD-10-CM | POA: Diagnosis not present

## 2022-02-18 DIAGNOSIS — Z8673 Personal history of transient ischemic attack (TIA), and cerebral infarction without residual deficits: Secondary | ICD-10-CM | POA: Insufficient documentation

## 2022-02-18 LAB — CMP (CANCER CENTER ONLY)
ALT: 11 U/L (ref 0–44)
AST: 15 U/L (ref 15–41)
Albumin: 4.2 g/dL (ref 3.5–5.0)
Alkaline Phosphatase: 78 U/L (ref 38–126)
Anion gap: 9 (ref 5–15)
BUN: 15 mg/dL (ref 8–23)
CO2: 27 mmol/L (ref 22–32)
Calcium: 9.2 mg/dL (ref 8.9–10.3)
Chloride: 102 mmol/L (ref 98–111)
Creatinine: 1.05 mg/dL (ref 0.61–1.24)
GFR, Estimated: >60 mL/min (ref >60)
Glucose, Bld: 191 mg/dL — ABNORMAL HIGH (ref 70–99)
Potassium: 4.3 mmol/L (ref 3.5–5.1)
Sodium: 138 mmol/L (ref 135–145)
Total Bilirubin: 0.6 mg/dL (ref 0.3–1.2)
Total Protein: 7 g/dL (ref 6.5–8.1)

## 2022-02-18 LAB — CBC WITH DIFFERENTIAL (CANCER CENTER ONLY)
Abs Immature Granulocytes: 0.03 10*3/uL (ref 0.00–0.07)
Basophils Absolute: 0 10*3/uL (ref 0.0–0.1)
Basophils Relative: 0 %
Eosinophils Absolute: 0.3 10*3/uL (ref 0.0–0.5)
Eosinophils Relative: 3 %
HCT: 32.7 % — ABNORMAL LOW (ref 39.0–52.0)
Hemoglobin: 10.4 g/dL — ABNORMAL LOW (ref 13.0–17.0)
Immature Granulocytes: 0 %
Lymphocytes Relative: 10 %
Lymphs Abs: 0.8 10*3/uL (ref 0.7–4.0)
MCH: 27.5 pg (ref 26.0–34.0)
MCHC: 31.8 g/dL (ref 30.0–36.0)
MCV: 86.5 fL (ref 80.0–100.0)
Monocytes Absolute: 0.6 10*3/uL (ref 0.1–1.0)
Monocytes Relative: 6 %
Neutro Abs: 7.1 10*3/uL (ref 1.7–7.7)
Neutrophils Relative %: 81 %
Platelet Count: 203 10*3/uL (ref 150–400)
RBC: 3.78 MIL/uL — ABNORMAL LOW (ref 4.22–5.81)
RDW: 15.4 % (ref 11.5–15.5)
WBC Count: 8.9 10*3/uL (ref 4.0–10.5)
nRBC: 0 % (ref 0.0–0.2)

## 2022-02-19 ENCOUNTER — Other Ambulatory Visit (HOSPITAL_BASED_OUTPATIENT_CLINIC_OR_DEPARTMENT_OTHER): Payer: Self-pay

## 2022-02-19 LAB — KAPPA/LAMBDA LIGHT CHAINS
Kappa free light chain: 24.8 mg/L — ABNORMAL HIGH (ref 3.3–19.4)
Kappa, lambda light chain ratio: 0.6 (ref 0.26–1.65)
Lambda free light chains: 41.4 mg/L — ABNORMAL HIGH (ref 5.7–26.3)

## 2022-02-20 LAB — IGM: IgM (Immunoglobulin M), Srm: 1104 mg/dL — ABNORMAL HIGH (ref 15–143)

## 2022-02-24 ENCOUNTER — Other Ambulatory Visit (HOSPITAL_BASED_OUTPATIENT_CLINIC_OR_DEPARTMENT_OTHER): Payer: Self-pay

## 2022-02-25 ENCOUNTER — Inpatient Hospital Stay (HOSPITAL_BASED_OUTPATIENT_CLINIC_OR_DEPARTMENT_OTHER): Payer: Medicare Other | Admitting: Oncology

## 2022-02-25 VITALS — BP 98/58 | HR 76 | Temp 97.9°F | Resp 18 | Ht 73.0 in | Wt 226.1 lb

## 2022-02-25 DIAGNOSIS — N400 Enlarged prostate without lower urinary tract symptoms: Secondary | ICD-10-CM | POA: Diagnosis not present

## 2022-02-25 DIAGNOSIS — D649 Anemia, unspecified: Secondary | ICD-10-CM | POA: Diagnosis not present

## 2022-02-25 DIAGNOSIS — I4891 Unspecified atrial fibrillation: Secondary | ICD-10-CM | POA: Diagnosis not present

## 2022-02-25 DIAGNOSIS — R768 Other specified abnormal immunological findings in serum: Secondary | ICD-10-CM | POA: Diagnosis not present

## 2022-02-25 DIAGNOSIS — K5792 Diverticulitis of intestine, part unspecified, without perforation or abscess without bleeding: Secondary | ICD-10-CM | POA: Diagnosis not present

## 2022-02-25 DIAGNOSIS — D472 Monoclonal gammopathy: Secondary | ICD-10-CM | POA: Diagnosis not present

## 2022-02-25 DIAGNOSIS — I2510 Atherosclerotic heart disease of native coronary artery without angina pectoris: Secondary | ICD-10-CM | POA: Diagnosis not present

## 2022-02-25 LAB — PROTEIN ELECTROPHORESIS, SERUM
A/G Ratio: 1.1 (ref 0.7–1.7)
Albumin ELP: 3.4 g/dL (ref 2.9–4.4)
Alpha-1-Globulin: 0.3 g/dL (ref 0.0–0.4)
Alpha-2-Globulin: 0.7 g/dL (ref 0.4–1.0)
Beta Globulin: 0.7 g/dL (ref 0.7–1.3)
Gamma Globulin: 1.5 g/dL (ref 0.4–1.8)
Globulin, Total: 3.2 g/dL (ref 2.2–3.9)
M-Spike, %: 0.7 g/dL — ABNORMAL HIGH
Total Protein ELP: 6.6 g/dL (ref 6.0–8.5)

## 2022-02-25 NOTE — Progress Notes (Signed)
  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  He complains of arthritis pain, chiefly at the right shoulder.  He reports having multiple skin cancers removed from the scalp.  He is scheduled for Mohs surgery to remove a nonmelanoma lesion from the parietal scalp. He reports no bleeding.  Good appetite.  He has persistent vision loss in the right eye.  Objective:  Vital signs in last 24 hours:  Blood pressure (!) 98/58, pulse 76, temperature 97.9 F (36.6 C), temperature source Oral, resp. rate 18, height '6\' 1"'$  (1.854 m), weight 226 lb 1.6 oz (102.6 kg), SpO2 96 %.    Lymphatics: No cervical, supraclavicular, axillary, or inguinal nodes Resp: Coarse end inspiratory rhonchi at the lower posterior chest bilaterally, no respiratory distress Cardio: Irregular GI: No hepatosplenomegaly mild tenderness in the bilateral lower abdomen, no mass Vascular: No leg edema  Skin: Surgical site at the parietal scalp without evidence of remaining tumor   Lab Results:  Lab Results  Component Value Date   WBC 8.9 02/18/2022   HGB 10.4 (L) 02/18/2022   HCT 32.7 (L) 02/18/2022   MCV 86.5 02/18/2022   PLT 203 02/18/2022   NEUTROABS 7.1 02/18/2022    CMP  Lab Results  Component Value Date   NA 138 02/18/2022   K 4.3 02/18/2022   CL 102 02/18/2022   CO2 27 02/18/2022   GLUCOSE 191 (H) 02/18/2022   BUN 15 02/18/2022   CREATININE 1.05 02/18/2022   CALCIUM 9.2 02/18/2022   PROT 7.0 02/18/2022   ALBUMIN 4.2 02/18/2022   AST 15 02/18/2022   ALT 11 02/18/2022   ALKPHOS 78 02/18/2022   BILITOT 0.6 02/18/2022   GFRNONAA >60 02/18/2022   GFRAA 92 11/26/2019   IgM 1104  Medications: I have reviewed the patient's current medications.   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  There is no clinical evidence for progression to symptomatic Waldenstrm's or myeloma. He has mild anemia.  The anemia could be related to Waldenstrm's, chronic GI blood loss while on anticoagulation, or another etiology. He will return for a CBC in 3 months.  He will schedule for an office visit and repeat myeloma panel in 6 months.  He will call in the interim for symptoms of anemia.  Betsy Coder, MD  02/25/2022  12:24 PM

## 2022-03-03 ENCOUNTER — Other Ambulatory Visit (HOSPITAL_BASED_OUTPATIENT_CLINIC_OR_DEPARTMENT_OTHER): Payer: Self-pay

## 2022-03-04 ENCOUNTER — Other Ambulatory Visit (HOSPITAL_BASED_OUTPATIENT_CLINIC_OR_DEPARTMENT_OTHER): Payer: Self-pay

## 2022-03-04 MED ORDER — CLINDAMYCIN HCL 300 MG PO CAPS
ORAL_CAPSULE | ORAL | 0 refills | Status: DC
  Filled 2022-03-04: qty 28, 7d supply, fill #0

## 2022-03-05 ENCOUNTER — Ambulatory Visit (INDEPENDENT_AMBULATORY_CARE_PROVIDER_SITE_OTHER): Payer: Medicare Other

## 2022-03-05 ENCOUNTER — Other Ambulatory Visit (HOSPITAL_BASED_OUTPATIENT_CLINIC_OR_DEPARTMENT_OTHER): Payer: Self-pay

## 2022-03-05 DIAGNOSIS — I495 Sick sinus syndrome: Secondary | ICD-10-CM | POA: Diagnosis not present

## 2022-03-08 ENCOUNTER — Other Ambulatory Visit (HOSPITAL_BASED_OUTPATIENT_CLINIC_OR_DEPARTMENT_OTHER): Payer: Self-pay

## 2022-03-08 LAB — CUP PACEART REMOTE DEVICE CHECK
Battery Remaining Longevity: 54 mo
Battery Remaining Percentage: 56 %
Brady Statistic RA Percent Paced: 0 %
Brady Statistic RV Percent Paced: 67 %
Date Time Interrogation Session: 20240105021300
Implantable Lead Connection Status: 753985
Implantable Lead Connection Status: 753985
Implantable Lead Implant Date: 20170417
Implantable Lead Implant Date: 20170417
Implantable Lead Location: 753859
Implantable Lead Location: 753860
Implantable Lead Model: 7740
Implantable Lead Model: 7741
Implantable Lead Serial Number: 662696
Implantable Lead Serial Number: 751382
Implantable Pulse Generator Implant Date: 20170417
Lead Channel Impedance Value: 701 Ohm
Lead Channel Impedance Value: 718 Ohm
Lead Channel Setting Pacing Amplitude: 2.5 V
Lead Channel Setting Pacing Amplitude: 2.5 V
Lead Channel Setting Pacing Pulse Width: 0.4 ms
Lead Channel Setting Sensing Sensitivity: 2.5 mV
Pulse Gen Serial Number: 718418
Zone Setting Status: 755011

## 2022-03-15 ENCOUNTER — Other Ambulatory Visit: Payer: Self-pay | Admitting: Internal Medicine

## 2022-03-15 ENCOUNTER — Other Ambulatory Visit: Payer: Self-pay

## 2022-03-15 ENCOUNTER — Other Ambulatory Visit (HOSPITAL_BASED_OUTPATIENT_CLINIC_OR_DEPARTMENT_OTHER): Payer: Self-pay

## 2022-03-15 MED ORDER — HYDROCODONE-ACETAMINOPHEN 7.5-325 MG PO TABS
1.0000 | ORAL_TABLET | Freq: Three times a day (TID) | ORAL | 0 refills | Status: DC
  Filled 2022-03-15 (×2): qty 90, 30d supply, fill #0

## 2022-03-15 NOTE — Telephone Encounter (Signed)
Patient has request refill on medication Hydrocodone. Patient medication last refilled 01/18/2022. Patient has Opioid Contract on file dated 10/16/2021. Medication pend and sent to PCP [name], MD .

## 2022-03-16 ENCOUNTER — Other Ambulatory Visit: Payer: Self-pay

## 2022-03-17 ENCOUNTER — Other Ambulatory Visit (HOSPITAL_BASED_OUTPATIENT_CLINIC_OR_DEPARTMENT_OTHER): Payer: Self-pay

## 2022-03-18 ENCOUNTER — Other Ambulatory Visit (HOSPITAL_BASED_OUTPATIENT_CLINIC_OR_DEPARTMENT_OTHER): Payer: Self-pay

## 2022-03-19 ENCOUNTER — Other Ambulatory Visit (HOSPITAL_BASED_OUTPATIENT_CLINIC_OR_DEPARTMENT_OTHER): Payer: Self-pay

## 2022-03-22 DIAGNOSIS — C4442 Squamous cell carcinoma of skin of scalp and neck: Secondary | ICD-10-CM | POA: Diagnosis not present

## 2022-03-23 ENCOUNTER — Other Ambulatory Visit (HOSPITAL_BASED_OUTPATIENT_CLINIC_OR_DEPARTMENT_OTHER): Payer: Self-pay

## 2022-03-23 MED ORDER — AMOXICILLIN 500 MG PO CAPS
ORAL_CAPSULE | ORAL | 12 refills | Status: DC
  Filled 2022-03-23: qty 12, 3d supply, fill #0

## 2022-03-23 NOTE — Progress Notes (Signed)
Remote pacemaker transmission.   

## 2022-03-24 DIAGNOSIS — R200 Anesthesia of skin: Secondary | ICD-10-CM | POA: Diagnosis not present

## 2022-03-31 ENCOUNTER — Other Ambulatory Visit: Payer: Self-pay | Admitting: Internal Medicine

## 2022-03-31 ENCOUNTER — Other Ambulatory Visit (HOSPITAL_BASED_OUTPATIENT_CLINIC_OR_DEPARTMENT_OTHER): Payer: Self-pay

## 2022-03-31 MED ORDER — AMOXICILLIN-POT CLAVULANATE 875-125 MG PO TABS
1.0000 | ORAL_TABLET | Freq: Two times a day (BID) | ORAL | 0 refills | Status: DC
  Filled 2022-03-31: qty 20, 10d supply, fill #0

## 2022-03-31 NOTE — Telephone Encounter (Signed)
Having a flare of diverticulitis, low grade temp el;evation 1 degree and increasing LLQ pain but not severe  Rx refill Augmentin  Meds ordered this encounter  Medications   amoxicillin-clavulanate (AUGMENTIN) 875-125 MG tablet    Sig: Take 1 tablet by mouth 2 (two) times daily.    Dispense:  20 tablet    Refill:  0

## 2022-03-31 NOTE — Telephone Encounter (Signed)
Please advise Sir, thank you. 

## 2022-04-06 ENCOUNTER — Ambulatory Visit (HOSPITAL_COMMUNITY): Payer: Medicare Other | Attending: Internal Medicine

## 2022-04-06 DIAGNOSIS — I5032 Chronic diastolic (congestive) heart failure: Secondary | ICD-10-CM

## 2022-04-06 LAB — ECHOCARDIOGRAM COMPLETE
AR max vel: 1.95 cm2
AV Area VTI: 2 cm2
AV Area mean vel: 1.89 cm2
AV Mean grad: 8 mmHg
AV Peak grad: 14.6 mmHg
Ao pk vel: 1.91 m/s
Area-P 1/2: 3.14 cm2
S' Lateral: 3.5 cm

## 2022-04-08 ENCOUNTER — Encounter (HOSPITAL_COMMUNITY): Payer: Self-pay | Admitting: *Deleted

## 2022-04-08 ENCOUNTER — Other Ambulatory Visit (HOSPITAL_BASED_OUTPATIENT_CLINIC_OR_DEPARTMENT_OTHER): Payer: Self-pay

## 2022-04-12 ENCOUNTER — Other Ambulatory Visit: Payer: Self-pay | Admitting: Cardiovascular Disease

## 2022-04-12 ENCOUNTER — Other Ambulatory Visit (HOSPITAL_BASED_OUTPATIENT_CLINIC_OR_DEPARTMENT_OTHER): Payer: Self-pay

## 2022-04-12 ENCOUNTER — Other Ambulatory Visit: Payer: Self-pay

## 2022-04-12 MED ORDER — ROSUVASTATIN CALCIUM 20 MG PO TABS
20.0000 mg | ORAL_TABLET | Freq: Every day | ORAL | 0 refills | Status: DC
  Filled 2022-04-12: qty 90, 90d supply, fill #0

## 2022-04-13 ENCOUNTER — Other Ambulatory Visit (HOSPITAL_BASED_OUTPATIENT_CLINIC_OR_DEPARTMENT_OTHER): Payer: Self-pay

## 2022-04-19 ENCOUNTER — Ambulatory Visit: Payer: Medicare Other | Admitting: Cardiovascular Disease

## 2022-04-20 ENCOUNTER — Other Ambulatory Visit (HOSPITAL_BASED_OUTPATIENT_CLINIC_OR_DEPARTMENT_OTHER): Payer: Self-pay

## 2022-04-22 ENCOUNTER — Ambulatory Visit: Payer: Medicare Other | Attending: Cardiovascular Disease | Admitting: Cardiovascular Disease

## 2022-04-22 ENCOUNTER — Encounter: Payer: Self-pay | Admitting: Cardiovascular Disease

## 2022-04-22 VITALS — BP 130/82 | HR 58 | Ht 73.0 in | Wt 216.6 lb

## 2022-04-22 DIAGNOSIS — I4819 Other persistent atrial fibrillation: Secondary | ICD-10-CM | POA: Diagnosis not present

## 2022-04-22 DIAGNOSIS — Z952 Presence of prosthetic heart valve: Secondary | ICD-10-CM | POA: Insufficient documentation

## 2022-04-22 DIAGNOSIS — I5032 Chronic diastolic (congestive) heart failure: Secondary | ICD-10-CM | POA: Diagnosis not present

## 2022-04-22 DIAGNOSIS — I25119 Atherosclerotic heart disease of native coronary artery with unspecified angina pectoris: Secondary | ICD-10-CM | POA: Diagnosis not present

## 2022-04-22 DIAGNOSIS — D6869 Other thrombophilia: Secondary | ICD-10-CM | POA: Diagnosis not present

## 2022-04-22 NOTE — Progress Notes (Signed)
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   The patient is here alone today.  He remains quite limited from a standpoint of his arthritis.  He complains of exertional chest discomfort, typical of angina, worse after eating a meal.  He stops to rest and his symptoms resolve over about 15 minutes.  Symptoms are longstanding but somewhat worse since I have seen him last.  He is not having resting angina.  No syncopal episodes.  No heart palpitations.  Chronic shortness of breath is unchanged.  No orthopnea or PND.  Patient is compliant with his medical regimen.  Past Medical History:  Diagnosis Date   Aortic stenosis    moderate aortic stenosis   Arthritis    Benign prostatic hypertrophy    Chronic systolic heart failure (HCC)    Chronotropic incompetence with sinus node dysfunction (HCC)    Status post Guidant dual-mode, dual-pacing, dual-sensing  pacemaker   implantation now programmed to AAI with recent generator change.   Coronary artery disease    status post multiple prior percutaneous coronary interventions, microvascular angina per Dr Olevia Perches   Diverticulitis sigmoid colon recurrent    Dysfunctional autonomic nervous system    Dyspnea    Heart murmur    History of primary hypertension    Hypercoagulable state ([place])    chronically anticoagulated with  coumadin   Hyperlipidemia    Hyperthyroidism    Hypothyroidism    Dr. 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 Lia Foyer,    Prediabetes 09/21/2017   Stroke ([place])    1990    Past Surgical History:  Procedure Laterality Date   AORTIC VALVE REPLACEMENT  03/15/2011   Procedure: AORTIC VALVE REPLACEMENT (AVR);  Surgeon: Gaye Pollack, MD;  Location: [facility];  Service: Open Heart Surgery;  Laterality: N/A;   APPENDECTOMY     CARDIAC CATHETERIZATION     11   CARDIOVERSION     CARDIOVERSION  04/15/2011   Procedure: CARDIOVERSION;  Surgeon: Loralie Champagne, MD;  Location: [facility];  Service: Cardiovascular;  Laterality: N/A;   CARDIOVERSION N/A 09/11/2014   Procedure: CARDIOVERSION;  Surgeon: Sueanne Margarita, MD;  Location: [facility];  Service: Cardiovascular;  Laterality: N/A;   CARDIOVERSION N/A 06/27/2015   Procedure: CARDIOVERSION;  Surgeon: Thayer Headings, MD;  Location: [facility];  Service: Cardiovascular;  Laterality: N/A;   CARDIOVERSION N/A 07/04/2015   Procedure: CARDIOVERSION;  Surgeon: Evans Lance, MD;  Location: [facility];  Service: Cardiovascular;  Laterality: N/A;   CARDIOVERSION N/A 04/13/2017   Procedure: CARDIOVERSION;  Surgeon: Sueanne Margarita, MD;  Location[facility] ENDOSCOPY;  Service: Cardiovascular;  Laterality: N/A;   COLONOSCOPY     CORONARY STENT INTERVENTION N/A 10/15/2019  Procedure: CORONARY STENT INTERVENTION;  Surgeon: Jettie Booze, MD;  Location: [place] CV LAB;  Service: Cardiovascular;  Laterality: N/A;   EP IMPLANTABLE DEVICE N/A 06/16/2015   Procedure: Pacemaker Implant;  Surgeon: Evans Lance, MD;  Location: [facility] CV LAB;  Service: Cardiovascular;  Laterality: N/A;   hemrrhoidectomy     LEFT AND RIGHT HEART CATHETERIZATION WITH CORONARY ANGIOGRAM Bilateral 02/01/2011   Procedure: LEFT AND RIGHT  HEART CATHETERIZATION WITH CORONARY ANGIOGRAM;  Surgeon: Hillary Bow, MD;  Location: [facility] CATH LAB;  Service: Cardiovascular;  Laterality: Bilateral;   LEFT HEART CATH AND CORONARY ANGIOGRAPHY N/A 10/15/2019   Procedure: LEFT HEART CATH AND CORONARY ANGIOGRAPHY;  Surgeon: Jettie Booze, MD;  Location: [place] CV LAB;  Service: Cardiovascular;  Laterality: N/A;   LEFT HEART CATH AND CORONARY ANGIOGRAPHY N/A 10/07/2020   Procedure: LEFT HEART CATH AND CORONARY ANGIOGRAPHY;  Surgeon: Sherren Mocha, MD;  Location: [place] CV LAB;  Service: Cardiovascular;  Laterality: N/A;   LUMBAR LAMINECTOMY/DECOMPRESSION MICRODISCECTOMY Right 02/23/2021   Procedure: Right Lumbar four-five microdiscectomy;  Surgeon: Eustace Moore, MD;  Location: [place];  Service: Neurosurgery;  Laterality: Right;   MAZE  03/15/2011   Procedure: MAZE;  Surgeon: Gaye Pollack, MD;  Location: [facility];  Service: Open Heart Surgery;  Laterality: N/A;   MOHS SURGERY  09/2021   PACEMAKER INSERTION  1991   Guidant PPM, most recent Generator Change by Dr Olevia Perches was 08/22/06   RIGHT/LEFT HEART CATH AND CORONARY ANGIOGRAPHY N/A 07/06/2016   Procedure: Right/Left Heart Cath and Coronary Angiography;  Surgeon: Sherren Mocha, MD;  Location: [place] CV LAB;  Service: Cardiovascular;  Laterality: N/A;   TEE WITHOUT CARDIOVERSION  04/15/2011   Procedure: TRANSESOPHAGEAL ECHOCARDIOGRAM (TEE);  Surgeon: Loralie Champagne, MD;  Location: [facility];  Service: Cardiovascular;  Laterality: N/A;   TEE WITHOUT CARDIOVERSION N/A 09/11/2014   Procedure: TRANSESOPHAGEAL ECHOCARDIOGRAM (TEE);  Surgeon: Sueanne Margarita, MD;  Location: [facility] ENDOSCOPY;  Service: Cardiovascular;  Laterality: N/A;    Current Medications: Current Meds  Medication Sig   amoxicillin (AMOXIL) 500 MG capsule Take 4 capsules by mouth 1 hour prior to dental appointment.   apixaban (ELIQUIS) 5 MG TABS tablet TAKE 1 TABLET BY MOUTH 2 TIMES DAILY.    butalbital-acetaminophen-caffeine (FIORICET) 50-325-40 MG tablet Take 1 tablet by mouth twice daily as needed for headache.   Cholecalciferol (VITAMIN D3) 2000 units capsule Take 2,000 Units by mouth daily.   cilostazol (PLETAL) 100 MG tablet Take 1 tablet (100 mg total) by mouth 2 (two) times daily.   Coenzyme Q10 200 MG TABS Take 200 mg by mouth daily.   desoximetasone (TOPICORT) 0.25 % cream Apply 1 application topically 2 (two) times daily.   digoxin (LANOXIN) 0.125 MG tablet Take 1 tablet (0.125 mg total) by mouth daily.   docusate sodium (COLACE) 100 MG capsule Take 100 mg by mouth 2 (two) times daily.   doxazosin (CARDURA) 4 MG tablet 1 tablet PO QD   ezetimibe (ZETIA) 10 MG tablet Take 1 tablet (10 mg total) by mouth daily.   HYDROcodone-acetaminophen (NORCO) 7.5-325 MG tablet Take 1 tablet by mouth every 8 (eight) hours. As needed for pain   magnesium oxide (MAG-OX) 400 MG tablet Take 1 tablet (400 mg total) by mouth daily.   metoprolol succinate (TOPROL-XL) 50 MG 24 hr tablet Take 1 tablet (50 mg total) by mouth 2 (two) times daily.   Multiple Vitamins-Minerals (MULTIVITAMIN WITH MINERALS) tablet Take 1 tablet  by mouth daily.   nitroGLYCERIN (NITROSTAT) 0.4 MG SL tablet PLACE 1 TABLET UNDER THE TONGUE EVERY 5 MINUTES AS NEEDED FOR CHEST PAIN.   NON FORMULARY Zegrid Take one tablet by mouth once daily as needed.   pantoprazole (PROTONIX) 40 MG tablet Take 1 tablet (40 mg total) by mouth 2 (two) times daily.   polyethylene glycol (MIRALAX / GLYCOLAX) 17 g packet Take 17 g by mouth daily.   potassium chloride SA (KLOR-CON M) 20 MEQ tablet TAKE 1 TABLET BY MOUTH DAILY.   predniSONE (DELTASONE) 10 MG tablet Take 1 tablet (10 mg total) by mouth daily with breakfast. (Patient taking differently: Take 5 mg by mouth daily with breakfast.)   rosuvastatin (CRESTOR) 20 MG tablet Take 1 tablet (20 mg total) by mouth at bedtime. Please keep scheduled appointment for future refills. Thank you.      Allergies:   Contrast media [iodinated contrast media], Gadolinium derivatives, Iodine-131, Metrizamide, and Tetanus toxoids   Social History   Socioeconomic History   Marital status: Married    Spouse name: Baker Janus    Number of children: 3   Years of education: Not on file   Highest education level: Not on file  Occupational History   Occupation: Retired    Comment: Physician  Tobacco Use   Smoking status: Never   Smokeless tobacco: Never  Vaping Use   Vaping Use: Never used  Substance and Sexual Activity   Alcohol use: No    Alcohol/week: 0.0 standard drinks of alcohol   Drug use: No   Sexual activity: Not Currently  Other Topics Concern   Not on file  Social History Narrative   Married to [place]. Has grown children   Retired Horticulturist, commercial MD      Never smoker no alcohol      Social Determinants of Radio broadcast assistant Strain: Not on file  Food Insecurity: Not on file  Transportation Needs: Not on file  Physical Activity: Not on file  Stress: Not on file  Social Connections: Not on file     Family History: The patient's family history includes Anorexia nervosa in his daughter; Atrial fibrillation in his brother; CAD in his brother; Depression in his daughter; Heart disease in his brother; Hypertension in his son; Sarcoidosis in his brother. There is no history of Anesthesia problems, Hypotension, Malignant hyperthermia, or Pseudochol deficiency.  ROS:   Please see the history of present illness.    All other systems reviewed and are negative.  EKGs/Labs/Other Studies Reviewed:    The following studies were reviewed today: Cardiac Cath 10/07/2020:   Prox RCA lesion is 30% stenosed.   Mid RCA lesion is 40% stenosed.   Prox LAD to Mid LAD lesion is 40% stenosed.   Previously placed Mid Cx to Dist Cx stent (unknown type) is  widely patent.   Non-stenotic Dist RCA lesion was previously treated.   Patent left main with no stenosis Patent LAD with mild-moderate  proximal stenosis unchanged from previous cardiac cath studies Patent LCx stent with no significant stenosis Patent RCA with mild diffuse plaquing and patent stent in the distal RCA Normal LV filling pressure and no transaortic gradient across bioprosthetic aortic valve   Recommend: medical therapy.    Note the patient has frequent runs of PAT throughout the procedure with HR above 160 bpm and drop in blood pressure down to the 70's during these runs  Echo 04/06/2022: 1. Left ventricular ejection fraction, by estimation, is 60 to 65%. Left  ventricular ejection fraction by PLAX is 65 %. The left ventricle has  normal function. The left ventricle has no regional wall motion  abnormalities. There is mild left ventricular  hypertrophy. Left ventricular diastolic function could not be evaluated.   2. Right ventricular systolic function is normal. The right ventricular  size is moderately enlarged. There is moderately elevated pulmonary artery  systolic pressure. The estimated right ventricular systolic pressure is  123XX123 mmHg.   3. Left atrial size was severely dilated.   4. Right atrial size was severely dilated.   5. The mitral valve is abnormal. Mild mitral valve regurgitation.  Moderate to severe mitral annular calcification.   6. The aortic valve has been repaired/replaced. Aortic valve  regurgitation is trivial. There is a 25 mm Edwards MagnaEase valve present  in the aortic position. Procedure Date: 2013. Echo findings are consistent  with normal structure and function of the  aortic valve prosthesis. Aortic valve mean gradient measures 8.0 mmHg.  Aortic valve Vmax measures 1.91 m/s. Peak gradient 14.6 mmHg. DI is 0.44.   7. Aortic dilatation noted. There is mild dilatation of the ascending  aorta, measuring 43 mm.   8. The inferior vena cava is dilated in size with <50% respiratory  variability, suggesting right atrial pressure of 15 mmHg.   Comparison(s): Changes from prior study  are noted. 04/13/2021: LVEF 60-65%,  AVR with mean gradient 14 mmHg, RVSP 41.6 mmHg.    EKG:  EKG is not ordered today.   Recent Labs: 02/18/2022: ALT 11; BUN 15; Creatinine 1.05; Hemoglobin 10.4; Platelet Count 203; Potassium 4.3; Sodium 138  Recent Lipid Panel    Component Value Date/Time   CHOL 131 04/13/2021 0109   CHOL 158 09/02/2017 1006   TRIG 27 04/13/2021 0109   HDL 62 04/13/2021 0109   HDL 72 09/02/2017 1006   CHOLHDL 2.1 04/13/2021 0109   VLDL 5 04/13/2021 0109   LDLCALC 64 04/13/2021 0109   LDLCALC 68 09/02/2017 1006   LDLDIRECT 117.9 11/16/2010 1355     Risk Assessment/Calculations:    CHA2DS2-VASc Score = 7   This indicates a 11.2% annual risk of stroke. The patient's score is based upon: CHF History: 1 HTN History: 1 Diabetes History: 0 Stroke History: 2 Vascular Disease History: 1 Age Score: 2 Gender Score: 0               Physical Exam:    VS:  BP 130/82 (BP Location: Right Arm, Patient Position: Sitting, Cuff Size: Normal)   Pulse (!) 58   Ht '6\' 1"'$  (1.854 m)   Wt 216 lb 9.6 oz (98.2 kg)   SpO2 93%   BMI 28.58 kg/m     Wt Readings from Last 3 Encounters:  04/22/22 216 lb 9.6 oz (98.2 kg)  02/25/22 226 lb 1.6 oz (102.6 kg)  02/16/22 219 lb 8 oz (99.6 kg)     GEN:  Well nourished, well developed in no acute distress HEENT: Normal NECK: No JVD; No carotid bruits LYMPHATICS: No lymphadenopathy CARDIAC: RRR, 2/6 systolic murmur at the RUSB RESPIRATORY:  Clear to auscultation without rales, wheezing or rhonchi  ABDOMEN: Soft, non-tender, non-distended MUSCULOSKELETAL:  No edema; No deformity  SKIN: Warm and dry NEUROLOGIC:  Alert and oriented x 3 PSYCHIATRIC:  Normal affect   ASSESSMENT:    1. Persistent atrial fibrillation ([place])   2. Chronic diastolic heart failure ([place])   3. S/P AVR (aortic valve replacement)   4. Secondary hypercoagulable state ([place])  5. Coronary artery disease involving native coronary artery of native heart  with angina pectoris ([place])    PLAN:    In order of problems listed above:  The patient's rate control is good with metoprolol succinate 50 mg twice daily.  Anticoagulated with apixaban.  Appears clinically stable. Patient functionally limited from angina, shortness of breath, and arthritis.  LVEF is normal.  No signs of volume overload on his exam.  Continue current therapies.  Blood pressure appears well-controlled. Normal valve function on his recent echo which I reviewed today with him.  Mean gradient 8 mmHg with no paravalvular regurgitation. Tolerating apixaban without bleeding problems. Patient reports exertional angina, worse after eating.  He continues on metoprolol.  Difficult to escalate antianginal therapy in the setting of his autonomic dysfunction and orthostatic hypotension.  He has not tolerated or not benefited from ranolazine in the past.  Will continue current management.  If he develops progressive symptoms of worsening angina, I would plan on repeat cardiac catheterization to evaluate for new obstructive disease.  He does have known microvascular dysfunction as well as history of epicardial CAD and previous stenting as outlined above.           Medication Adjustments/Labs and Tests Ordered: Current medicines are reviewed at length with the patient today.  Concerns regarding medicines are outlined above.  No orders of the defined types were placed in this encounter.  No orders of the defined types were placed in this encounter.   There are no Patient Instructions on file for this visit.   Signed, Sherren Mocha, MD  04/22/2022 12:02 PM    [place]

## 2022-04-22 NOTE — Patient Instructions (Signed)
Medication Instructions:  Your physician recommends that you continue on your current medications as directed. Please refer to the Current Medication list given to you today.  *If you need a refill on your cardiac medications before your next appointment, please call your pharmacy*   Lab Work: NONE If you have labs (blood work) drawn today and your tests are completely normal, you will receive your results only by: [place] (if you have MyChart) OR A paper copy in the mail If you have any lab test that is abnormal or we need to change your treatment, we will call you to review the results.   Testing/Procedures: NONE   Follow-Up: At [facility], you and your health needs are our priority.  As part of our continuing mission to provide you with exceptional heart care, we have created designated Provider Care Teams.  These Care Teams include your primary Cardiologist (physician) and Advanced Practice Providers (APPs -  Physician Assistants and Nurse Practitioners) who all work together to provide you with the care you need, when you need it.  We recommend signing up for the patient portal called "MyChart".  Sign up information is provided on this After Visit Summary.  MyChart is used to connect with patients for Virtual Visits (Telemedicine).  Patients are able to view lab/test results, encounter notes, upcoming appointments, etc.  Non-urgent messages can be sent to your provider as well.   To learn more about what you can do with MyChart, go to [email or web address].    Your next appointment:   6 month(s)  Provider:   Sherren Mocha, MD

## 2022-04-26 DIAGNOSIS — S0100XA Unspecified open wound of scalp, initial encounter: Secondary | ICD-10-CM | POA: Diagnosis not present

## 2022-05-03 ENCOUNTER — Other Ambulatory Visit: Payer: Self-pay | Admitting: Internal Medicine

## 2022-05-03 ENCOUNTER — Other Ambulatory Visit (HOSPITAL_BASED_OUTPATIENT_CLINIC_OR_DEPARTMENT_OTHER): Payer: Self-pay

## 2022-05-03 MED ORDER — HYDROCODONE-ACETAMINOPHEN 7.5-325 MG PO TABS
1.0000 | ORAL_TABLET | Freq: Three times a day (TID) | ORAL | 0 refills | Status: DC
  Filled 2022-05-03: qty 90, 30d supply, fill #0

## 2022-05-03 NOTE — Telephone Encounter (Signed)
Patient has request refill on medication Hydrocodone 7.5-'325mg'$ . Patient medication pend and sent to PCP [name], MD for approval.

## 2022-05-03 NOTE — Telephone Encounter (Signed)
Patient medication Hydrocodone refused by another provider. Medication pend and sent to PCP [name], MD

## 2022-05-09 ENCOUNTER — Other Ambulatory Visit: Payer: Self-pay | Admitting: Internal Medicine

## 2022-05-09 ENCOUNTER — Other Ambulatory Visit: Payer: Self-pay | Admitting: Cardiovascular Disease

## 2022-05-10 ENCOUNTER — Other Ambulatory Visit: Payer: Self-pay

## 2022-05-10 ENCOUNTER — Other Ambulatory Visit (HOSPITAL_BASED_OUTPATIENT_CLINIC_OR_DEPARTMENT_OTHER): Payer: Self-pay

## 2022-05-10 ENCOUNTER — Encounter: Payer: Self-pay | Admitting: Internal Medicine

## 2022-05-10 MED ORDER — PREDNISONE 10 MG PO TABS
10.0000 mg | ORAL_TABLET | Freq: Every day | ORAL | 1 refills | Status: DC
  Filled 2022-05-10: qty 90, 90d supply, fill #0

## 2022-05-10 MED ORDER — CILOSTAZOL 100 MG PO TABS
100.0000 mg | ORAL_TABLET | Freq: Two times a day (BID) | ORAL | 3 refills | Status: DC
  Filled 2022-05-10: qty 180, 90d supply, fill #0
  Filled 2022-08-02: qty 180, 90d supply, fill #1
  Filled 2022-10-30: qty 180, 90d supply, fill #2
  Filled 2023-01-28: qty 180, 90d supply, fill #3

## 2022-05-11 ENCOUNTER — Other Ambulatory Visit (HOSPITAL_BASED_OUTPATIENT_CLINIC_OR_DEPARTMENT_OTHER): Payer: Self-pay

## 2022-05-11 ENCOUNTER — Other Ambulatory Visit: Payer: Self-pay | Admitting: Internal Medicine

## 2022-05-11 MED ORDER — LORAZEPAM 1 MG PO TABS
1.0000 mg | ORAL_TABLET | Freq: Two times a day (BID) | ORAL | 1 refills | Status: DC | PRN
  Filled 2022-05-11: qty 60, 30d supply, fill #0
  Filled 2022-06-14: qty 60, 30d supply, fill #1

## 2022-05-11 NOTE — Progress Notes (Signed)
Ativan Ordered °

## 2022-05-12 ENCOUNTER — Other Ambulatory Visit (HOSPITAL_BASED_OUTPATIENT_CLINIC_OR_DEPARTMENT_OTHER): Payer: Self-pay

## 2022-05-18 ENCOUNTER — Other Ambulatory Visit: Payer: Self-pay

## 2022-05-18 ENCOUNTER — Other Ambulatory Visit (HOSPITAL_BASED_OUTPATIENT_CLINIC_OR_DEPARTMENT_OTHER): Payer: Self-pay

## 2022-05-18 ENCOUNTER — Non-Acute Institutional Stay: Payer: Medicare Other | Admitting: Internal Medicine

## 2022-05-18 ENCOUNTER — Encounter: Payer: Self-pay | Admitting: Internal Medicine

## 2022-05-18 VITALS — BP 182/92 | HR 70 | Temp 97.9°F | Resp 18 | Ht 73.0 in | Wt 213.0 lb

## 2022-05-18 DIAGNOSIS — I25118 Atherosclerotic heart disease of native coronary artery with other forms of angina pectoris: Secondary | ICD-10-CM

## 2022-05-18 DIAGNOSIS — G894 Chronic pain syndrome: Secondary | ICD-10-CM | POA: Diagnosis not present

## 2022-05-18 DIAGNOSIS — D472 Monoclonal gammopathy: Secondary | ICD-10-CM

## 2022-05-18 DIAGNOSIS — I10 Essential (primary) hypertension: Secondary | ICD-10-CM

## 2022-05-18 DIAGNOSIS — E785 Hyperlipidemia, unspecified: Secondary | ICD-10-CM

## 2022-05-18 DIAGNOSIS — E1159 Type 2 diabetes mellitus with other circulatory complications: Secondary | ICD-10-CM

## 2022-05-18 MED ORDER — PREDNISONE 1 MG PO TABS
1.0000 mg | ORAL_TABLET | Freq: Every day | ORAL | 3 refills | Status: DC
  Filled 2022-05-18: qty 4, 4d supply, fill #0

## 2022-05-18 MED ORDER — DESOXIMETASONE 0.25 % EX CREA
1.0000 | TOPICAL_CREAM | Freq: Two times a day (BID) | CUTANEOUS | 0 refills | Status: DC
  Filled 2022-05-18: qty 60, 30d supply, fill #0

## 2022-05-18 MED ORDER — PREDNISONE 1 MG PO TABS
1.0000 mg | ORAL_TABLET | Freq: Every day | ORAL | 3 refills | Status: DC
  Filled 2022-05-18: qty 30, 30d supply, fill #0
  Filled 2022-06-14: qty 30, 30d supply, fill #1

## 2022-05-18 NOTE — Progress Notes (Unsigned)
Location:  [place] of Service:  Clinic (12)  Provider:   Code Status:  Goals of Care:     05/18/2022   10:13 AM  Advanced Directives  Does Patient Have a Medical Advance Directive? Yes  Type of Paramedic of [place];Living will;Out of facility DNR (pink MOST or yellow form)     Chief Complaint  Patient presents with   Medical Management of Chronic Issues    Patient is being seen for a 3 month follow up   Quality Metric Gaps    Discussed the need for AWV and Diabetic Kidney Evaluation   Immunizations    Discussed the need for immunizations patient declined    HPI: Patient is a 88 y.o. male seen today for medical management of chronic diseases.    Lives in [place] with his Wife Now has Personal nurse who helps with meds She is not with him today as she just had Back surgery  Diabetes New Diagnosis A1C 7.4  6 months ago He does not want any medications and want to modify his diet   I have ordered Repeat A1C which he will get with other blood work in [place]  Hypertension Recently last few days BP running very high sometimes more then [id number] Systolic It was high today also He is very high risk for Orthostatics BP He is taking Norvasc 2.5 mg Breaking it in half    Lower extremity edema No Edema recently      Severe arthritis Pain seems to be controlled with Norco and prednisone 5 mg He wants to taper his Prednisone off Try maintain Pain on Norco   Abdominal pain The CT was negative and pain is probably due to constipation Not taking Movantik Using Miralax   Also Has Augmentin in case for Diverticulitis   Chronic issues Has h/o CAD, s/p Stent Distal RCA Stent Cath in 09/2019 Repeat Cath Done in 08/22 for Anginal Symptoms. 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 Follows with Dr Leonie Man Annually carotid ultrasound which did not show any significant stenosis 2D echo which showed a EF of 60% with inferior wall hypokinesis   Headache Wants to take Fioricet every day almost H/o Skin cancers s/p Mohrs surgery for Melanoma  Past Medical History:  Diagnosis Date   Aortic stenosis    moderate aortic stenosis   Arthritis    Benign prostatic hypertrophy    Chronic systolic heart failure (HCC)    Chronotropic incompetence with sinus node dysfunction (HCC)    Status post Guidant dual-mode, dual-pacing, dual-sensing  pacemaker   implantation now programmed to AAI with recent generator change.   Coronary artery disease    status post multiple prior percutaneous coronary interventions, microvascular angina per Dr Olevia Perches   Diverticulitis sigmoid colon recurrent    Dysfunctional autonomic nervous system    Dyspnea    Heart murmur  History of primary hypertension    Hypercoagulable state ([place])    chronically anticoagulated with coumadin   Hyperlipidemia    Hyperthyroidism    Hypothyroidism    Dr. 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 Lia Foyer,    Prediabetes 09/21/2017   Stroke ([place])    1990    Past Surgical History:  Procedure Laterality Date   AORTIC VALVE REPLACEMENT  03/15/2011   Procedure: AORTIC VALVE REPLACEMENT (AVR);  Surgeon: Gaye Pollack, MD;  Location: [facility];  Service: Open Heart  Surgery;  Laterality: N/A;   APPENDECTOMY     CARDIAC CATHETERIZATION     11   CARDIOVERSION     CARDIOVERSION  04/15/2011   Procedure: CARDIOVERSION;  Surgeon: Loralie Champagne, MD;  Location: [facility];  Service: Cardiovascular;  Laterality: N/A;   CARDIOVERSION N/A 09/11/2014   Procedure: CARDIOVERSION;  Surgeon: Sueanne Margarita, MD;  Location: [facility] ENDOSCOPY;  Service: Cardiovascular;  Laterality: N/A;   CARDIOVERSION N/A 06/27/2015   Procedure: CARDIOVERSION;  Surgeon: Thayer Headings, MD;  Location: [facility];  Service: Cardiovascular;  Laterality: N/A;   CARDIOVERSION N/A 07/04/2015   Procedure: CARDIOVERSION;  Surgeon: Evans Lance, MD;  Location: [facility];  Service: Cardiovascular;  Laterality: N/A;   CARDIOVERSION N/A 04/13/2017   Procedure: CARDIOVERSION;  Surgeon: Sueanne Margarita, MD;  Location: [facility] ENDOSCOPY;  Service: Cardiovascular;  Laterality: N/A;   COLONOSCOPY     CORONARY STENT INTERVENTION N/A 10/15/2019   Procedure: CORONARY STENT INTERVENTION;  Surgeon: Jettie Booze, MD;  Location: [place] CV LAB;  Service: Cardiovascular;  Laterality: N/A;   EP IMPLANTABLE DEVICE N/A 06/16/2015   Procedure: Pacemaker Implant;  Surgeon: Evans Lance, MD;  Location: [place] CV LAB;  Service: Cardiovascular;  Laterality: N/A;   hemrrhoidectomy     LEFT AND RIGHT HEART CATHETERIZATION WITH CORONARY ANGIOGRAM Bilateral 02/01/2011   Procedure: LEFT AND RIGHT HEART CATHETERIZATION WITH CORONARY ANGIOGRAM;  Surgeon: Hillary Bow, MD;  Location: [facility] CATH LAB;  Service: Cardiovascular;  Laterality: Bilateral;   LEFT HEART CATH AND CORONARY ANGIOGRAPHY N/A 10/15/2019   Procedure: LEFT HEART CATH AND CORONARY ANGIOGRAPHY;  Surgeon: Jettie Booze, MD;  Location: [place] CV LAB;  Service: Cardiovascular;  Laterality: N/A;   LEFT HEART CATH AND CORONARY ANGIOGRAPHY N/A 10/07/2020   Procedure: LEFT HEART CATH AND CORONARY ANGIOGRAPHY;  Surgeon: Sherren Mocha,  MD;  Location: [facility] CV LAB;  Service: Cardiovascular;  Laterality: N/A;   LUMBAR LAMINECTOMY/DECOMPRESSION MICRODISCECTOMY Right 02/23/2021   Procedure: Right Lumbar four-five microdiscectomy;  Surgeon: Eustace Moore, MD;  Location: [facility];  Service: Neurosurgery;  Laterality: Right;   MAZE  03/15/2011   Procedure: MAZE;  Surgeon: Gaye Pollack, MD;  Location: [place];  Service: Open Heart Surgery;  Laterality: N/A;   MOHS SURGERY  09/2021   PACEMAKER INSERTION  1991   Guidant PPM, most recent Generator Change by Dr Olevia Perches was 08/22/06   RIGHT/LEFT HEART CATH AND CORONARY ANGIOGRAPHY N/A 07/06/2016   Procedure: Right/Left Heart Cath and Coronary Angiography;  Surgeon: Sherren Mocha, MD;  Location: [facility] CV LAB;  Service: Cardiovascular;  Laterality: N/A;   TEE WITHOUT CARDIOVERSION  04/15/2011   Procedure: TRANSESOPHAGEAL ECHOCARDIOGRAM (TEE);  Surgeon: Loralie Champagne, MD;  Location: [facility];  Service: Cardiovascular;  Laterality: N/A;   TEE WITHOUT CARDIOVERSION N/A 09/11/2014  Procedure: TRANSESOPHAGEAL ECHOCARDIOGRAM (TEE);  Surgeon: Sueanne Margarita, MD;  Location: [facility] ENDOSCOPY;  Service: Cardiovascular;  Laterality: N/A;    Allergies  Allergen Reactions   Contrast Media [Iodinated Contrast Media] Hives   Gadolinium Derivatives Hives   Iodine-131 Hives   Metrizamide Hives   Tetanus Toxoids     Outpatient Encounter Medications as of 05/18/2022  Medication Sig   amoxicillin (AMOXIL) 500 MG capsule Take 4 capsules by mouth 1 hour prior to dental appointment.   apixaban (ELIQUIS) 5 MG TABS tablet TAKE 1 TABLET BY MOUTH 2 TIMES DAILY.   butalbital-acetaminophen-caffeine (FIORICET) 50-325-40 MG tablet Take 1 tablet by mouth twice daily as needed for headache.   Cholecalciferol (VITAMIN D3) 2000 units capsule Take 2,000 Units by mouth daily.   cilostazol (PLETAL) 100 MG tablet Take 1 tablet (100 mg total) by mouth 2 (two) times daily.   Coenzyme Q10 200 MG TABS Take 200 mg  by mouth daily.   digoxin (LANOXIN) 0.125 MG tablet Take 1 tablet (0.125 mg total) by mouth daily.   docusate sodium (COLACE) 100 MG capsule Take 100 mg by mouth 2 (two) times daily.   doxazosin (CARDURA) 4 MG tablet 1 tablet PO QD   ezetimibe (ZETIA) 10 MG tablet Take 1 tablet (10 mg total) by mouth daily.   HYDROcodone-acetaminophen (NORCO) 7.5-325 MG tablet Take 1 tablet by mouth every 8 (eight) hours. As needed for pain   LORazepam (ATIVAN) 1 MG tablet Take 1 tablet (1 mg) by mouth at bedtime, and 0.5 tablet (0.5) daily as needed for anxiety.   magnesium oxide (MAG-OX) 400 MG tablet Take 1 tablet (400 mg total) by mouth daily.   metoprolol succinate (TOPROL-XL) 50 MG 24 hr tablet Take 1 tablet (50 mg total) by mouth 2 (two) times daily.   midodrine (PROAMATINE) 2.5 MG tablet Take 1 tablet (2.5 mg total) by mouth 2 (two) times daily with a meal. (Patient taking differently: Take 2.5 mg by mouth 2 (two) times daily as needed.)   Multiple Vitamins-Minerals (MULTIVITAMIN WITH MINERALS) tablet Take 1 tablet by mouth daily.   nitroGLYCERIN (NITROSTAT) 0.4 MG SL tablet PLACE 1 TABLET UNDER THE TONGUE EVERY 5 MINUTES AS NEEDED FOR CHEST PAIN.   NON FORMULARY Zegrid Take one tablet by mouth once daily as needed.   pantoprazole (PROTONIX) 40 MG tablet Take 1 tablet (40 mg total) by mouth 2 (two) times daily.   polyethylene glycol (MIRALAX / GLYCOLAX) 17 g packet Take 17 g by mouth daily.   potassium chloride SA (KLOR-CON M) 20 MEQ tablet TAKE 1 TABLET BY MOUTH DAILY.   predniSONE (DELTASONE) 1 MG tablet Take 1 tablet (1 mg total) by mouth daily with breakfast.   rosuvastatin (CRESTOR) 20 MG tablet Take 1 tablet (20 mg total) by mouth at bedtime. Please keep scheduled appointment for future refills. Thank you.   [DISCONTINUED] desoximetasone (TOPICORT) 0.25 % cream Apply 1 application topically 2 (two) times daily.   [DISCONTINUED] predniSONE (DELTASONE) 1 MG tablet Take 1 tablet (1 mg total) by mouth  daily with breakfast.   [DISCONTINUED] predniSONE (DELTASONE) 10 MG tablet Take 1 tablet (10 mg total) by mouth daily with breakfast.   amoxicillin-clavulanate (AUGMENTIN) 875-125 MG tablet Take 1 tablet by mouth 2 (two) times daily. (Patient not taking: Reported on 04/22/2022)   desoximetasone (TOPICORT) 0.25 % cream Apply 1 Application topically 2 (two) times daily.   diclofenac Sodium (VOLTAREN) 1 % GEL Apply 2 g topically 2 (two) times daily as needed (knee  pain). (Patient not taking: Reported on 04/22/2022)   [DISCONTINUED] pregabalin (LYRICA) 75 MG capsule Take 75 mg by mouth 2 (two) times daily.   No facility-administered encounter medications on file as of 05/18/2022.    Review of Systems:  Review of Systems  Constitutional:  Negative for activity change, appetite change and unexpected weight change.  HENT: Negative.    Respiratory:  Negative for cough and shortness of breath.   Cardiovascular:  Positive for chest pain. Negative for leg swelling.  Gastrointestinal:  Negative for constipation.  Genitourinary:  Positive for frequency.  Musculoskeletal:  Positive for arthralgias and myalgias. Negative for gait problem.  Skin:  Positive for rash.  Neurological:  Positive for dizziness. Negative for weakness.  Psychiatric/Behavioral:  Negative for confusion and sleep disturbance.   All other systems reviewed and are negative.   Health Maintenance  Topic Date Due   Medicare Annual Wellness (AWV)  Never done   Diabetic kidney evaluation - Urine ACR  Never done   Zoster Vaccines- Shingrix (1 of 2) Never done   Pneumonia Vaccine 67+ Years old (1 of 1 - PCV) Never done   DTaP/Tdap/Td (2 - Td or Tdap) 10/24/2014   COVID-19 Vaccine (4 - 2023-24 season) 10/30/2021   Diabetic kidney evaluation - eGFR measurement  02/19/2023   INFLUENZA VACCINE  Completed   HPV VACCINES  Aged Out    Physical Exam: Vitals:   05/18/22 1010 05/18/22 1014  BP: (!) 184/96 (!) 182/92  Pulse: 70   Resp: 18    Temp: 97.9 F (36.6 C)   TempSrc: Temporal   SpO2: 99%   Weight: 213 lb (96.6 kg)   Height: 6\' 1"  (1.854 m)    Body mass index is 28.1 kg/m. Physical Exam Vitals reviewed.  Constitutional:      Appearance: Normal appearance.  HENT:     Head: Normocephalic.     Nose: Nose normal.     Mouth/Throat:     Mouth: Mucous membranes are moist.     Pharynx: Oropharynx is clear.  Eyes:     Pupils: Pupils are equal, round, and reactive to light.  Cardiovascular:     Rate and Rhythm: Normal rate. Rhythm irregular.     Pulses: Normal pulses.     Heart sounds: No murmur heard. Pulmonary:     Effort: Pulmonary effort is normal. No respiratory distress.     Breath sounds: Normal breath sounds. No rales.  Abdominal:     General: Abdomen is flat. Bowel sounds are normal.     Palpations: Abdomen is soft.  Musculoskeletal:        General: No swelling.     Cervical back: Neck supple.  Skin:    General: Skin is warm.     Findings: Erythema and rash present.  Neurological:     General: No focal deficit present.     Mental Status: He is alert and oriented to person, place, and time.  Psychiatric:        Mood and Affect: Mood normal.        Thought Content: Thought content normal.     Labs reviewed: Basic Metabolic Panel: Recent Labs    08/06/21 1151 10/22/21 0000 11/10/21 0000 02/18/22 1432  NA 140 139 143 138  K 4.3 4.1 4.3 4.3  CL 100 100 101 102  CO2 31 28* 28* 27  GLUCOSE 130*  --   --  191*  BUN 22 11  --  15  CREATININE 1.16 1.0 1.0  1.05  CALCIUM 9.6 9.2 9.3 9.2   Liver Function Tests: Recent Labs    08/06/21 1151 02/18/22 1432  AST 21 15  ALT 26 11  ALKPHOS 88 78  BILITOT 0.6 0.6  PROT 7.2  7.5 7.0  ALBUMIN 4.1 4.2   No results for input(s): "LIPASE", "AMYLASE" in the last 8760 hours. No results for input(s): "AMMONIA" in the last 8760 hours. CBC: Recent Labs    08/06/21 1151 10/22/21 0000 11/10/21 0000 02/18/22 1432  WBC 13.4* 7.3 9.0 8.9   NEUTROABS 11.2*  --   --  7.1  HGB 11.6* 11.0* 11.3* 10.4*  HCT 35.6* 33* 34* 32.7*  MCV 83.8  --   --  86.5  PLT 225.0 214 278 203   Lipid Panel: No results for input(s): "CHOL", "HDL", "LDLCALC", "TRIG", "CHOLHDL", "LDLDIRECT" in the last 8760 hours. Lab Results  Component Value Date   HGBA1C 7.4 10/22/2021    Procedures since last visit: No results found.  Assessment/Plan 1. Essential hypertension BP high today D/w Dr Burt Knack who has increased the Toprol and then take Norvasc QD for now But later on his BP at home was low Dr Mariam Dollar will keep close Follow up on this  2. Chronic pain syndrome On Chronic Norco through [place]  3. Type 2 diabetes mellitus with other circulatory complication, without long-term current use of insulin (HCC) Refused Meds Diet for now  - Hemoglobin A1c; Future - TSH; Future  4. MGUS (monoclonal gammopathy of unknown significance) Follows with Dr [name]  5. Coronary artery disease of native artery of native heart with stable angina pectoris ([place]) Continues to have issue with Angina Nitro makes BP drop Not able to do much  S/p Multiple Caths Closely with Cardiologists On Statin   6. Hyperlipidemia, unspecified hyperlipidemia type Crestor Needs Follow up of Lipids 7 . Benign prostatic hyperplasia with lower urinary tract symptoms, symptom details unspecified [facility] recommended I and O cath He refused due to risk of bleeding on Eliquis 8  Gastroesophageal reflux disease without esophagitis Protonix   9. Persistent atrial fibrillation (HCC) Eliquis 10 Headache Fioricet   11 Anxiety Lorazepam per [facility] 12 Retinal artery branch occlusion of right eye Pletal per Neurology 13Drug-induced constipation Miralax   Labs/tests ordered:  * No order type specified * Next appt:  09/21/2022

## 2022-05-19 ENCOUNTER — Other Ambulatory Visit (HOSPITAL_BASED_OUTPATIENT_CLINIC_OR_DEPARTMENT_OTHER): Payer: Self-pay

## 2022-05-20 ENCOUNTER — Telehealth: Payer: Self-pay | Admitting: Cardiovascular Disease

## 2022-05-20 ENCOUNTER — Other Ambulatory Visit (HOSPITAL_BASED_OUTPATIENT_CLINIC_OR_DEPARTMENT_OTHER): Payer: Self-pay

## 2022-05-20 MED ORDER — METOPROLOL SUCCINATE ER 25 MG PO TB24
75.0000 mg | ORAL_TABLET | Freq: Two times a day (BID) | ORAL | 3 refills | Status: DC
  Filled 2022-05-20: qty 540, 90d supply, fill #0
  Filled 2022-08-12: qty 540, 90d supply, fill #1
  Filled 2022-11-05: qty 540, 90d supply, fill #2
  Filled 2023-02-08: qty 540, 90d supply, fill #3

## 2022-05-20 MED ORDER — AMLODIPINE BESYLATE 2.5 MG PO TABS
1.2500 mg | ORAL_TABLET | Freq: Every day | ORAL | 3 refills | Status: DC
  Filled 2022-05-20: qty 45, 90d supply, fill #0
  Filled 2022-08-12: qty 45, 90d supply, fill #1
  Filled 2022-11-05: qty 45, 90d supply, fill #2
  Filled 2023-02-08: qty 45, 90d supply, fill #3

## 2022-05-20 NOTE — Telephone Encounter (Signed)
-----   Message from Sherren Mocha, MD sent at 05/18/2022  5:37 PM EDT ----- Thx for the message. I talked to him this afternoon. I had him increase his metoprolol to 75 mg BID and asked him to start taking the amlodipine on a daily basis. You're right he is tough to manage. He got home after his BP was >200 in your office and at home his systolic BP was 98 mmHg - very labile. Appreciate you reaching out.  [name] ----- Message ----- From: [name], MD Sent: 05/18/2022  10:55 AM EDT To: Sherren Mocha, MD  Dr Burt Knack, Dr Mariam Dollar came to see me this morning and his.  BP is running high Sometimes more then [id number] systolic. He is on Eliquis and Pletal also so high risk for bleed. He is taking Norvasc cutting in half somehow I don't know how, as it is small pill as needed.  I know we use Hydralazine 10 mg as needed but didn't want to do anything till talk to you. Let me know if there is anything I can do. I know he is very complex with his Orthostatic hypotension Thanks for you help. [name]

## 2022-05-20 NOTE — Telephone Encounter (Signed)
Updating pt's MAR to reflect recommendations made by Dr Burt Knack. Verified directly during clinic that he does want patient on 75mg  Toprol BID and one-half tablet of amlodipine 2.5mg .

## 2022-05-27 ENCOUNTER — Inpatient Hospital Stay: Payer: Medicare Other

## 2022-05-28 ENCOUNTER — Inpatient Hospital Stay: Payer: Medicare Other | Attending: Oncology

## 2022-05-28 ENCOUNTER — Telehealth: Payer: Self-pay | Admitting: *Deleted

## 2022-05-28 DIAGNOSIS — R7303 Prediabetes: Secondary | ICD-10-CM

## 2022-05-28 DIAGNOSIS — D649 Anemia, unspecified: Secondary | ICD-10-CM

## 2022-05-28 DIAGNOSIS — E039 Hypothyroidism, unspecified: Secondary | ICD-10-CM

## 2022-05-28 DIAGNOSIS — D472 Monoclonal gammopathy: Secondary | ICD-10-CM | POA: Insufficient documentation

## 2022-05-28 LAB — CBC WITH DIFFERENTIAL (CANCER CENTER ONLY)
Abs Immature Granulocytes: 0.01 10*3/uL (ref 0.00–0.07)
Basophils Absolute: 0 10*3/uL (ref 0.0–0.1)
Basophils Relative: 0 %
Eosinophils Absolute: 0.2 10*3/uL (ref 0.0–0.5)
Eosinophils Relative: 3 %
HCT: 35.4 % — ABNORMAL LOW (ref 39.0–52.0)
Hemoglobin: 11.1 g/dL — ABNORMAL LOW (ref 13.0–17.0)
Immature Granulocytes: 0 %
Lymphocytes Relative: 10 %
Lymphs Abs: 0.7 10*3/uL (ref 0.7–4.0)
MCH: 27.1 pg (ref 26.0–34.0)
MCHC: 31.4 g/dL (ref 30.0–36.0)
MCV: 86.6 fL (ref 80.0–100.0)
Monocytes Absolute: 0.5 10*3/uL (ref 0.1–1.0)
Monocytes Relative: 6 %
Neutro Abs: 6.2 10*3/uL (ref 1.7–7.7)
Neutrophils Relative %: 81 %
Platelet Count: 217 10*3/uL (ref 150–400)
RBC: 4.09 MIL/uL — ABNORMAL LOW (ref 4.22–5.81)
RDW: 15.5 % (ref 11.5–15.5)
WBC Count: 7.7 10*3/uL (ref 4.0–10.5)
nRBC: 0 % (ref 0.0–0.2)

## 2022-05-28 NOTE — Telephone Encounter (Signed)
Patient called to report Dr. Lyndel Safe is asking for HgbA1 and TSH to lab drawn today. OK per Dr. Benay Spice.

## 2022-05-28 NOTE — Telephone Encounter (Signed)
Only had lavender tube for CBC today. Not able to add on requested labs. Dr. Velora Heckler notified via VM.

## 2022-05-31 LAB — HEMOGLOBIN A1C
Hgb A1c MFr Bld: 7 % — ABNORMAL HIGH (ref 4.8–5.6)
Mean Plasma Glucose: 154 mg/dL

## 2022-06-01 ENCOUNTER — Telehealth: Payer: Self-pay | Admitting: Neurology

## 2022-06-01 NOTE — Telephone Encounter (Signed)
Kim called. Stated she needs to talk to nurse about a medication pt is on. She is requesting a call back from nurse

## 2022-06-01 NOTE — Telephone Encounter (Signed)
Maudie Mercury was called back and was told pt would need an appointment, she said she will get back in contact with Dr Burt Knack re: pt staying on cilostazol (PLETAL) 100 MG tablet  after needed cardiac procedure.  Maudie Mercury was thankful for the call back with response from RN.

## 2022-06-03 ENCOUNTER — Other Ambulatory Visit (HOSPITAL_BASED_OUTPATIENT_CLINIC_OR_DEPARTMENT_OTHER): Payer: Self-pay

## 2022-06-03 ENCOUNTER — Other Ambulatory Visit: Payer: Self-pay | Admitting: Internal Medicine

## 2022-06-03 ENCOUNTER — Other Ambulatory Visit: Payer: Self-pay

## 2022-06-03 DIAGNOSIS — I4819 Other persistent atrial fibrillation: Secondary | ICD-10-CM

## 2022-06-03 MED ORDER — HYDROCODONE-ACETAMINOPHEN 7.5-325 MG PO TABS
1.0000 | ORAL_TABLET | Freq: Three times a day (TID) | ORAL | 0 refills | Status: DC
  Filled 2022-06-03: qty 90, 30d supply, fill #0

## 2022-06-03 MED ORDER — APIXABAN 5 MG PO TABS
5.0000 mg | ORAL_TABLET | Freq: Two times a day (BID) | ORAL | 1 refills | Status: DC
  Filled 2022-06-03: qty 180, 90d supply, fill #0
  Filled 2022-09-06: qty 180, 90d supply, fill #1

## 2022-06-03 NOTE — Telephone Encounter (Signed)
Pharmacy requested refill.  Epic LR: 05/03/2022 Contract Date: 10/06/2021  Pended Rx and sent to Dr. Lyndel Safe for approval.

## 2022-06-03 NOTE — Telephone Encounter (Signed)
Eliquis 5mg  refill request received. Patient is 88 years old, weight-96.6kg, Crea-1.05 on 02/18/22, Diagnosis-Afib, and last seen by Dr. Burt Knack on 04/22/22. Dose is appropriate based on dosing criteria. Will send in refill to requested pharmacy.

## 2022-06-04 ENCOUNTER — Other Ambulatory Visit (HOSPITAL_BASED_OUTPATIENT_CLINIC_OR_DEPARTMENT_OTHER): Payer: Self-pay

## 2022-06-04 ENCOUNTER — Ambulatory Visit (INDEPENDENT_AMBULATORY_CARE_PROVIDER_SITE_OTHER): Payer: Medicare Other

## 2022-06-04 DIAGNOSIS — I495 Sick sinus syndrome: Secondary | ICD-10-CM | POA: Diagnosis not present

## 2022-06-04 LAB — CUP PACEART REMOTE DEVICE CHECK
Battery Remaining Longevity: 54 mo
Battery Remaining Percentage: 54 %
Brady Statistic RA Percent Paced: 0 %
Brady Statistic RV Percent Paced: 67 %
Date Time Interrogation Session: 20240405021100
Implantable Lead Connection Status: 753985
Implantable Lead Connection Status: 753985
Implantable Lead Implant Date: 20170417
Implantable Lead Implant Date: 20170417
Implantable Lead Location: 753859
Implantable Lead Location: 753860
Implantable Lead Model: 7740
Implantable Lead Model: 7741
Implantable Lead Serial Number: 662696
Implantable Lead Serial Number: 751382
Implantable Pulse Generator Implant Date: 20170417
Lead Channel Impedance Value: 698 Ohm
Lead Channel Impedance Value: 815 Ohm
Lead Channel Setting Pacing Amplitude: 2.5 V
Lead Channel Setting Pacing Amplitude: 2.5 V
Lead Channel Setting Pacing Pulse Width: 0.4 ms
Lead Channel Setting Sensing Sensitivity: 2.5 mV
Pulse Gen Serial Number: 718418
Zone Setting Status: 755011

## 2022-06-15 ENCOUNTER — Other Ambulatory Visit: Payer: Self-pay

## 2022-06-23 ENCOUNTER — Other Ambulatory Visit (HOSPITAL_BASED_OUTPATIENT_CLINIC_OR_DEPARTMENT_OTHER): Payer: Self-pay

## 2022-06-28 ENCOUNTER — Telehealth: Payer: Self-pay | Admitting: Cardiovascular Disease

## 2022-06-28 NOTE — Telephone Encounter (Signed)
[name], home health nurse for patient, is calling requesting call back to discuss craterization for patient.

## 2022-06-29 ENCOUNTER — Ambulatory Visit: Payer: Medicare Other | Attending: Cardiovascular Disease | Admitting: Cardiovascular Disease

## 2022-06-29 ENCOUNTER — Other Ambulatory Visit (HOSPITAL_BASED_OUTPATIENT_CLINIC_OR_DEPARTMENT_OTHER): Payer: Self-pay

## 2022-06-29 ENCOUNTER — Encounter: Payer: Self-pay | Admitting: Cardiovascular Disease

## 2022-06-29 VITALS — BP 142/92 | HR 67 | Ht 73.0 in | Wt 215.0 lb

## 2022-06-29 DIAGNOSIS — I5032 Chronic diastolic (congestive) heart failure: Secondary | ICD-10-CM | POA: Insufficient documentation

## 2022-06-29 DIAGNOSIS — I484 Atypical atrial flutter: Secondary | ICD-10-CM | POA: Diagnosis not present

## 2022-06-29 DIAGNOSIS — Z01810 Encounter for preprocedural cardiovascular examination: Secondary | ICD-10-CM | POA: Insufficient documentation

## 2022-06-29 DIAGNOSIS — D6869 Other thrombophilia: Secondary | ICD-10-CM | POA: Insufficient documentation

## 2022-06-29 DIAGNOSIS — Z952 Presence of prosthetic heart valve: Secondary | ICD-10-CM | POA: Diagnosis not present

## 2022-06-29 DIAGNOSIS — I25119 Atherosclerotic heart disease of native coronary artery with unspecified angina pectoris: Secondary | ICD-10-CM | POA: Diagnosis not present

## 2022-06-29 DIAGNOSIS — I10 Essential (primary) hypertension: Secondary | ICD-10-CM | POA: Diagnosis not present

## 2022-06-29 MED ORDER — PREDNISONE 50 MG PO TABS
ORAL_TABLET | ORAL | 0 refills | Status: DC
  Filled 2022-06-29: qty 3, 1d supply, fill #0

## 2022-06-29 MED ORDER — DIPHENHYDRAMINE HCL 25 MG PO TABS
50.0000 mg | ORAL_TABLET | ORAL | 0 refills | Status: DC
  Filled 2022-06-29: qty 100, 30d supply, fill #0
  Filled 2022-07-04: qty 100, 50d supply, fill #0
  Filled 2022-12-16: qty 30, 15d supply, fill #0

## 2022-06-29 NOTE — H&P (View-Only) (Signed)
Cardiology Office Note:    Date:  07/02/2022   ID:  Kevin Kass, [name], DOB Apr 23, 1934, MRN [id number]  PCP:  Mahlon Gammon, [name]   [facility] Providers Cardiologist:  Jodelle Red, [name]     Referring [name]: Mahlon Gammon, [name]   Chief Complaint  Patient presents with   Chest Pain   Coronary Artery Disease    History of Present Illness:    Kevin Kass, [name] is a 88 y.o. male with a hx of  longstanding atypical atrial flutter and atrial tachycardia, coronary artery disease with chronic angina, aortic valve disease status post bioprosthetic aortic valve replacement, orthostatic hypotension, and chronic diastolic heart failure, presenting for evaluation of chest pain.   The patient is here with his home nurse, Selena Batten, today.  They contacted [place] because of Dr. Henrene Hawking progressive chest discomfort.  He had an episode about 72 hours ago where he experienced fairly severe substernal chest pressure that occurred when he was walking to his car after eating dinner.  He has had postprandial angina, but symptoms have been worse over the past few months.  He had a long walk to his car and developed more severe symptoms.  He had to stop and rest, reporting that it took 15 to 30 minutes for his symptoms to finally resolve.  The patient has not had resting angina, but he is limited by chest discomfort and has to eat small meals now.  He is walking shorter distances.  He complains of exertional dyspnea as well as fatigue.  No edema, orthopnea, PND, or heart palpitations.  He has had issues with labile blood pressure and orthostatic hypotension, but his blood pressure has been reasonably well-controlled of late.  Past Medical History:  Diagnosis Date   Aortic stenosis    moderate aortic stenosis   Arthritis    Benign prostatic hypertrophy    Chronic systolic heart failure (HCC)    Chronotropic incompetence with sinus node dysfunction    Status post Guidant dual-mode, dual-pacing,  dual-sensing  pacemaker   implantation now programmed to AAI with recent generator change.   Coronary artery disease    status post multiple prior percutaneous coronary interventions, microvascular angina per Dr [name]   Diverticulitis sigmoid colon recurrent    Dysfunctional autonomic nervous system    Dyspnea    Heart murmur    History of primary hypertension    Hypercoagulable state (HCC)    chronically anticoagulated with coumadin   Hyperlipidemia    Hyperthyroidism    Hypothyroidism    Dr. [name]   MGUS (monoclonal gammopathy of unknown significance) 02/17/2013   Ocular myasthenia (HCC)    Osteoarthritis    Paroxysmal atrial fibrillation (HCC)    DR. Riley Kill,    Prediabetes 09/21/2017   Stroke (HCC)    1990    Past Surgical History:  Procedure Laterality Date   AORTIC VALVE REPLACEMENT  03/15/2011   Procedure: AORTIC VALVE REPLACEMENT (AVR);  Surgeon: Alleen Borne, [name];  Location: [facility] OR;  Service: Open Heart Surgery;  Laterality: N/A;   APPENDECTOMY     CARDIAC CATHETERIZATION     11   CARDIOVERSION     CARDIOVERSION  04/15/2011   Procedure: CARDIOVERSION;  Surgeon: Marca Ancona, [name];  Location: [facility] ENDOSCOPY;  Service: Cardiovascular;  Laterality: N/A;   CARDIOVERSION N/A 09/11/2014   Procedure: CARDIOVERSION;  Surgeon: Quintella Reichert, [name];  Location: [facility] ENDOSCOPY;  Service: Cardiovascular;  Laterality: N/A;   CARDIOVERSION N/A 06/27/2015  Procedure: CARDIOVERSION;  Surgeon: Vesta Mixer, [name];  Location: [facility] ENDOSCOPY;  Service: Cardiovascular;  Laterality: N/A;   CARDIOVERSION N/A 07/04/2015   Procedure: CARDIOVERSION;  Surgeon: Marinus Maw, [name];  Location: [facility] ENDOSCOPY;  Service: Cardiovascular;  Laterality: N/A;   CARDIOVERSION N/A 04/13/2017   Procedure: CARDIOVERSION;  Surgeon: Quintella Reichert, [name];  Location: [facility] ENDOSCOPY;  Service: Cardiovascular;  Laterality: N/A;   COLONOSCOPY     CORONARY STENT INTERVENTION N/A 10/15/2019   Procedure: CORONARY STENT  INTERVENTION;  Surgeon: Corky Crafts, [name];  Location: [facility] INVASIVE CV LAB;  Service: Cardiovascular;  Laterality: N/A;   EP IMPLANTABLE DEVICE N/A 06/16/2015   Procedure: Pacemaker Implant;  Surgeon: Marinus Maw, [name];  Location: [facility] INVASIVE CV LAB;  Service: Cardiovascular;  Laterality: N/A;   hemrrhoidectomy     LEFT AND RIGHT HEART CATHETERIZATION WITH CORONARY ANGIOGRAM Bilateral 02/01/2011   Procedure: LEFT AND RIGHT HEART CATHETERIZATION WITH CORONARY ANGIOGRAM;  Surgeon: Herby Abraham, [name];  Location: [facility] CATH LAB;  Service: Cardiovascular;  Laterality: Bilateral;   LEFT HEART CATH AND CORONARY ANGIOGRAPHY N/A 10/15/2019   Procedure: LEFT HEART CATH AND CORONARY ANGIOGRAPHY;  Surgeon: Corky Crafts, [name];  Location: [facility] INVASIVE CV LAB;  Service: Cardiovascular;  Laterality: N/A;   LEFT HEART CATH AND CORONARY ANGIOGRAPHY N/A 10/07/2020   Procedure: LEFT HEART CATH AND CORONARY ANGIOGRAPHY;  Surgeon: Tonny Bollman, [name];  Location: [facility] INVASIVE CV LAB;  Service: Cardiovascular;  Laterality: N/A;   LUMBAR LAMINECTOMY/DECOMPRESSION MICRODISCECTOMY Right 02/23/2021   Procedure: Right Lumbar four-five microdiscectomy;  Surgeon: Tia Alert, [name];  Location: [facility] OR;  Service: Neurosurgery;  Laterality: Right;   MAZE  03/15/2011   Procedure: MAZE;  Surgeon: Alleen Borne, [name];  Location: [facility] OR;  Service: Open Heart Surgery;  Laterality: N/A;   MOHS SURGERY  09/2021   PACEMAKER INSERTION  1991   Guidant PPM, most recent Generator Change by Dr [name] was 08/22/06   RIGHT/LEFT HEART CATH AND CORONARY ANGIOGRAPHY N/A 07/06/2016   Procedure: Right/Left Heart Cath and Coronary Angiography;  Surgeon: Tonny Bollman, [name];  Location: [facility] INVASIVE CV LAB;  Service: Cardiovascular;  Laterality: N/A;   TEE WITHOUT CARDIOVERSION  04/15/2011   Procedure: TRANSESOPHAGEAL ECHOCARDIOGRAM (TEE);  Surgeon: Marca Ancona, [name];  Location: [facility] ENDOSCOPY;  Service: Cardiovascular;  Laterality: N/A;   TEE  WITHOUT CARDIOVERSION N/A 09/11/2014   Procedure: TRANSESOPHAGEAL ECHOCARDIOGRAM (TEE);  Surgeon: Quintella Reichert, [name];  Location[facility] ENDOSCOPY;  Service: Cardiovascular;  Laterality: N/A;    Current Medications: Current Meds  Medication Sig   amLODipine (NORVASC) 2.5 MG tablet Take 0.5 tablets (1.25 mg total) by mouth daily. (Patient taking differently: Take 1.25 mg by mouth as needed.)   amoxicillin (AMOXIL) 500 MG capsule Take 4 capsules by mouth 1 hour prior to dental appointment. (Patient taking differently: Take 500 mg by mouth as needed.)   amoxicillin-clavulanate (AUGMENTIN) 875-125 MG tablet Take 1 tablet by mouth 2 (two) times daily. (Patient taking differently: Take 1 tablet by mouth 2 (two) times daily. Takes it only for Diverticulitis Flare)   apixaban (ELIQUIS) 5 MG TABS tablet Take 1 tablet (5 mg total) by mouth 2 (two) times daily.   butalbital-acetaminophen-caffeine (FIORICET) 50-325-40 MG tablet Take 1 tablet by mouth twice daily as needed for headache.   Cholecalciferol (VITAMIN D3) 2000 units capsule Take 2,000 Units by mouth daily.   cilostazol (PLETAL) 100 MG tablet Take 1 tablet (100 mg total) by mouth 2 (two) times daily.  Coenzyme Q10 200 MG TABS Take 200 mg by mouth daily.   desoximetasone (TOPICORT) 0.25 % cream Apply 1 Application topically 2 (two) times daily.   diclofenac Sodium (VOLTAREN) 1 % GEL Apply 2 g topically 2 (two) times daily as needed (knee pain).   digoxin (LANOXIN) 0.125 MG tablet Take 1 tablet (0.125 mg total) by mouth daily.   diphenhydrAMINE (BENADRYL) 25 MG tablet Take 2 tablets (50 mg total) by mouth one hour prior to procedure   docusate sodium (COLACE) 100 MG capsule Take 100 mg by mouth 2 (two) times daily.   doxazosin (CARDURA) 4 MG tablet 1 tablet PO QD   ezetimibe (ZETIA) 10 MG tablet Take 1 tablet (10 mg total) by mouth daily.   HYDROcodone-acetaminophen (NORCO) 7.5-325 MG tablet Take 1 tablet by mouth every 8 (eight) hours. As needed for  pain   LORazepam (ATIVAN) 1 MG tablet Take 1 tablet (1 mg) by mouth at bedtime, and 0.5 tablet (0.5) daily as needed for anxiety.   magnesium oxide (MAG-OX) 400 MG tablet Take 1 tablet (400 mg total) by mouth daily.   metoprolol succinate (TOPROL XL) 25 MG 24 hr tablet Take 3 tablets (75 mg total) by mouth 2 (two) times daily.   midodrine (PROAMATINE) 2.5 MG tablet Take 1 tablet (2.5 mg total) by mouth 2 (two) times daily with a meal. (Patient taking differently: Take 2.5 mg by mouth 2 (two) times daily as needed.)   Multiple Vitamins-Minerals (MULTIVITAMIN WITH MINERALS) tablet Take 1 tablet by mouth daily.   nitroGLYCERIN (NITROSTAT) 0.4 MG SL tablet PLACE 1 TABLET UNDER THE TONGUE EVERY 5 MINUTES AS NEEDED FOR CHEST PAIN.   NON FORMULARY Zegrid Take one tablet by mouth once daily as needed.   pantoprazole (PROTONIX) 40 MG tablet Take 1 tablet (40 mg total) by mouth 2 (two) times daily.   polyethylene glycol (MIRALAX / GLYCOLAX) 17 g packet Take 17 g by mouth daily.   potassium chloride SA (KLOR-CON M) 20 MEQ tablet TAKE 1 TABLET BY MOUTH DAILY.   predniSONE (DELTASONE) 5 MG tablet Take 5 mg by mouth daily with breakfast.   predniSONE (DELTASONE) 50 MG tablet Take 1 tablet by mouth 13 hours prior, 7 hours prior, and 1 hour prior to procedure   rosuvastatin (CRESTOR) 20 MG tablet Take 1 tablet (20 mg total) by mouth at bedtime. Please keep scheduled appointment for future refills. Thank you.     Allergies:   Contrast media [iodinated contrast media], Gadolinium derivatives, Iodine-131, Metrizamide, and Tetanus toxoids   Social History   Socioeconomic History   Marital status: Married    Spouse name: Dondra Spry    Number of children: 3   Years of education: Not on file   Highest education level: Not on file  Occupational History   Occupation: Retired    Comment: Physician  Tobacco Use   Smoking status: Never   Smokeless tobacco: Never  Vaping Use   Vaping Use: Never used  Substance and  Sexual Activity   Alcohol use: No    Alcohol/week: 0.0 standard drinks of alcohol   Drug use: No   Sexual activity: Not Currently  Other Topics Concern   Not on file  Social History Narrative   Married to [place]. Has grown children   Retired Proofreader [name]      Never smoker no alcohol      Social Determinants of Corporate investment banker Strain: Not on file  Food Insecurity: Not on file  Transportation  Needs: Not on file  Physical Activity: Not on file  Stress: Not on file  Social Connections: Not on file     Family History: The patient's family history includes Anorexia nervosa in his daughter; Atrial fibrillation in his brother; CAD in his brother; Depression in his daughter; Heart disease in his brother; Hypertension in his son; Sarcoidosis in his brother. There is no history of Anesthesia problems, Hypotension, Malignant hyperthermia, or Pseudochol deficiency.  ROS:   Please see the history of present illness.    Positive for diffuse joint aches.  All other systems reviewed and are negative.  EKGs/Labs/Other Studies Reviewed:    The following studies were reviewed today: Cardiac Studies & Procedures   CARDIAC CATHETERIZATION  CARDIAC CATHETERIZATION 10/07/2020  Narrative   Prox RCA lesion is 30% stenosed.   Mid RCA lesion is 40% stenosed.   Prox LAD to Mid LAD lesion is 40% stenosed.   Previously placed Mid Cx to Dist Cx stent (unknown type) is  widely patent.   Non-stenotic Dist RCA lesion was previously treated.  Patent left main with no stenosis Patent LAD with mild-moderate proximal stenosis unchanged from previous cardiac cath studies Patent LCx stent with no significant stenosis Patent RCA with mild diffuse plaquing and patent stent in the distal RCA Normal LV filling pressure and no transaortic gradient across bioprosthetic aortic valve  Recommend: medical therapy.  Note the patient has frequent runs of PAT throughout the procedure with HR above 160 bpm  and drop in blood pressure down to the 70's during these runs  Findings Coronary Findings Diagnostic  Dominance: Right  Left Main Vessel is angiographically normal. Patent left main with mild calcification and no stenosis.  Left Anterior Descending Prox LAD to Mid LAD lesion is 40% stenosed. Diffuse calcific disease in the proximal LAD is present. This is mild-moderate in severity and unchanged from prior cath studies. Stenosis estimated at 40-50%.  Left Circumflex Previously placed Mid Cx to Dist Cx stent (unknown type) is  widely patent. Stented segment in the LCx remains patent with no significant stenosis  Right Coronary Artery There is mild diffuse disease throughout the vessel. Large, dominant vessel. There are diffuse irregularities with mild stenoses in the proximal and mid vessel. The distal vessel is widely patent. The PDA and PLA branches are patent. The stented segment in the distal RCA is patent with no stenosis. Prox RCA lesion is 30% stenosed. The lesion is calcified. Mid RCA lesion is 40% stenosed. The lesion is calcified. No change from prior study Non-stenotic Dist RCA lesion was previously treated. Stent remains widely patent with no restenosis  Intervention  No interventions have been documented.   CARDIAC CATHETERIZATION  CARDIAC CATHETERIZATION 10/15/2019  Narrative  Prox LAD to Mid LAD lesion is 40% stenosed.  Prox RCA lesion is 30% stenosed.  Mid RCA lesion is 40% stenosed.  Previously placed Mid Cx to Dist Cx stent (unknown type) is widely patent.  Dist RCA lesion is 75% stenosed. This was new compared to prior cath in 2018.  A drug-eluting stent was successfully placed using a SYNERGY XD 2.75X16. THis was dilated to 3.0 mm and postdilated to 3.4 mm.  Post intervention, there is a 0% residual stenosis.  There is moderate left ventricular systolic dysfunction.  LV end diastolic pressure is normal.  The left ventricular ejection fraction is  35-45% by visual estimate. Global hypokinesis.  No aspirin given Eliquis and Plavix.  Could use Plavix longer if no bleeding issues, but bare minimum  would be one month.  Continue aggressive secondary prevention and management of his tachycardia.  Findings Coronary Findings Diagnostic  Dominance: Right  Left Main Vessel is angiographically normal.  Left Anterior Descending Prox LAD to Mid LAD lesion is 40% stenosed. There is mild diffuse proximal LAD stenosis unchanged from previous study. The LAD wraps around the LV apex  Left Circumflex Previously placed Mid Cx to Dist Cx stent (unknown type) is widely patent.  Right Coronary Artery There is mild diffuse disease throughout the vessel. Large, dominant vessel. There are diffuse irregularities with mild stenoses in the proximal and mid vessel. The distal vessel is widely patent. The PDA and PLA branches are patent. Prox RCA lesion is 30% stenosed. The lesion is calcified. Mid RCA lesion is 40% stenosed. The lesion is calcified. Dist RCA lesion is 75% stenosed.  Intervention  Dist RCA lesion Stent CATH LAUNCHER 6FR JR4 guide catheter was inserted. Lesion crossed with guidewire using a WIRE ASAHI PROWATER 180CM. Pre-stent angioplasty was performed using a BALLOON SAPPHIRE 2.5X12. A drug-eluting stent was successfully placed using a SYNERGY XD 2.75X16. Stent strut is well apposed. Post-stent angioplasty was performed using a BALLOON SAPPHIRE [place] 3.25X12. Post-Intervention Lesion Assessment The intervention was successful. Pre-interventional TIMI flow is 3. Post-intervention TIMI flow is 3. No complications occurred at this lesion. There is a 0% residual stenosis post intervention.   STRESS TESTS  MYOCARDIAL PERFUSION IMAGING 02/13/2019  Narrative  Nuclear stress EF: 48%. Visually, the EF appears to be normal. Echo from today shows normal EF of 55-60%.  There was no ST segment deviation noted during stress.  This is a low risk  study. There is no evidence of ischemia or previous infarction.  The study is normal.   ECHOCARDIOGRAM  ECHOCARDIOGRAM COMPLETE 04/06/2022  Narrative ECHOCARDIOGRAM REPORT    Patient Name:   BRAESON KETNER Date of Exam: 04/06/2022 Medical Rec #:  [id number]        Height:       73.0 in Accession #:    [id number]       Weight:       226.1 lb Date of Birth:  1934-11-02         BSA:          2.267 m Patient Age:    87 years         BP:           98/58 mmHg Patient Gender: M                HR:           66 bpm. Exam Location:  [address]  Procedure: 2D Echo, Cardiac Doppler, Color Doppler and Strain Analysis  Indications:    I50.32 CHF  History:        Patient has prior history of Echocardiogram examinations, most recent 04/13/2021. CHF, CAD, Pacemaker, Stroke, Aortic Valve Disease, Arrythmias:Atrial Fibrillation, Signs/Symptoms:Shortness of Breath, Fatigue and Murmur; Risk Factors:Family History of Coronary Artery Disease, Hypertension and Dyslipidemia. Ocular Myasthenia, Aortic Stenosis status post Aortic Valve Replacement (2013). Aortic Valve: 25 mm Edwards MagnaEase valve is present in the aortic position. Procedure Date: 2013.  Sonographer:    Farrel Conners RDCS Referring Phys: Freddie Breech GUPTA  IMPRESSIONS   1. Left ventricular ejection fraction, by estimation, is 60 to 65%. Left ventricular ejection fraction by PLAX is 65 %. The left ventricle has normal function. The left ventricle has no regional wall motion abnormalities. There is mild left ventricular hypertrophy. Left  ventricular diastolic function could not be evaluated. 2. Right ventricular systolic function is normal. The right ventricular size is moderately enlarged. There is moderately elevated pulmonary artery systolic pressure. The estimated right ventricular systolic pressure is 45.9 mmHg. 3. Left atrial size was severely dilated. 4. Right atrial size was severely dilated. 5. The mitral valve is abnormal.  Mild mitral valve regurgitation. Moderate to severe mitral annular calcification. 6. The aortic valve has been repaired/replaced. Aortic valve regurgitation is trivial. There is a 25 mm Edwards MagnaEase valve present in the aortic position. Procedure Date: 2013. Echo findings are consistent with normal structure and function of the aortic valve prosthesis. Aortic valve mean gradient measures 8.0 mmHg. Aortic valve Vmax measures 1.91 m/s. Peak gradient 14.6 mmHg. DI is 0.44. 7. Aortic dilatation noted. There is mild dilatation of the ascending aorta, measuring 43 mm. 8. The inferior vena cava is dilated in size with <50% respiratory variability, suggesting right atrial pressure of 15 mmHg.  Comparison(s): Changes from prior study are noted. 04/13/2021: LVEF 60-65%, AVR with mean gradient 14 mmHg, RVSP 41.6 mmHg.  FINDINGS Left Ventricle: Left ventricular ejection fraction, by estimation, is 60 to 65%. Left ventricular ejection fraction by PLAX is 65 %. The left ventricle has normal function. The left ventricle has no regional wall motion abnormalities. The left ventricular internal cavity size was normal in size. There is mild left ventricular hypertrophy. Left ventricular diastolic function could not be evaluated due to atrial fibrillation. Left ventricular diastolic function could not be evaluated.  Right Ventricle: The right ventricular size is moderately enlarged. No increase in right ventricular wall thickness. Right ventricular systolic function is normal. There is moderately elevated pulmonary artery systolic pressure. The tricuspid regurgitant velocity is 2.78 m/s, and with an assumed right atrial pressure of 15 mmHg, the estimated right ventricular systolic pressure is 45.9 mmHg.  Left Atrium: Left atrial size was severely dilated.  Right Atrium: Right atrial size was severely dilated.  Pericardium: There is no evidence of pericardial effusion.  Mitral Valve: The mitral valve is  abnormal. Moderate to severe mitral annular calcification. Mild mitral valve regurgitation.  Tricuspid Valve: The tricuspid valve is grossly normal. Tricuspid valve regurgitation is trivial.  Aortic Valve: The aortic valve has been repaired/replaced. Aortic valve regurgitation is trivial. Aortic valve mean gradient measures 8.0 mmHg. Aortic valve peak gradient measures 14.6 mmHg. Aortic valve area, by VTI measures 2.00 cm. There is a 25 mm Edwards MagnaEase valve present in the aortic position. Procedure Date: 2013. Echo findings are consistent with normal structure and function of the aortic valve prosthesis.  Pulmonic Valve: The pulmonic valve was grossly normal. Pulmonic valve regurgitation is trivial.  Aorta: Aortic dilatation noted. There is mild dilatation of the ascending aorta, measuring 43 mm.  Venous: The inferior vena cava is dilated in size with less than 50% respiratory variability, suggesting right atrial pressure of 15 mmHg.  IAS/Shunts: No atrial level shunt detected by color flow Doppler.  Additional Comments: A device lead is visualized.   LEFT VENTRICLE PLAX 2D LV EF:         Left            Diastology ventricular     LV e' medial:    7.29 cm/s ejection        LV E/e' medial:  14.0 fraction by     LV e' lateral:   10.20 cm/s PLAX is 65      LV E/e' lateral: 10.0 %. LVIDd:  5.50 cm LVIDs:         3.50 cm LV PW:         1.20 cm LV IVS:        1.30 cm LVOT diam:     2.40 cm LV SV:         75 LV SV Index:   33 LVOT Area:     4.52 cm   RIGHT VENTRICLE RV Basal diam:  5.10 cm RV Mid diam:    3.70 cm RV S prime:     11.80 cm/s TAPSE (M-mode): 1.8 cm  LEFT ATRIUM              Index        RIGHT ATRIUM           Index LA diam:        5.70 cm  2.51 cm/m   RA Area:     29.80 cm LA Vol (A2C):   154.0 ml 67.94 ml/m  RA Volume:   110.00 ml 48.53 ml/m LA Vol (A4C):   78.4 ml  34.59 ml/m LA Biplane Vol: 114.0 ml 50.29 ml/m AORTIC VALVE AV Area (Vmax):     1.95 cm AV Area (Vmean):   1.89 cm AV Area (VTI):     2.00 cm AV Vmax:           191.00 cm/s AV Vmean:          127.000 cm/s AV VTI:            0.373 m AV Peak Grad:      14.6 mmHg AV Mean Grad:      8.0 mmHg LVOT Vmax:         82.18 cm/s LVOT Vmean:        52.960 cm/s LVOT VTI:          0.165 m LVOT/AV VTI ratio: 0.44  AORTA Ao Root diam: 3.50 cm Ao Asc diam:  4.30 cm  MITRAL VALVE                TRICUSPID VALVE MV Area (PHT): cm          TR Peak grad:   30.9 mmHg MV Decel Time: 242 msec     TR Vmax:        278.00 cm/s MV E velocity: 101.83 cm/s SHUNTS Systemic VTI:  0.16 m Systemic Diam: 2.40 cm  Zoila Shutter [name] Electronically signed by Zoila Shutter [name] Signature Date/Time: 04/06/2022/9:04:07 PM    Final    MONITORS  LONG TERM MONITOR (3-14 DAYS) 11/16/2020  Narrative 1. Atrial flutter with a CVR 2. Rare PVC's and NSVT 3. No prolonged pauses 4. Brief episodes of RVR with an ave HR of 63/min and briefly up to 130/min.   Gregg Taylor,[name]  Patch Wear Time:  2 days and 23 hours (2022-09-09T16:34:38-0400 to 2022-09-12T15:40:33-0400)  1 run of Ventricular Tachycardia occurred lasting 11 beats with a max rate of 169 bpm (avg 153 bpm). Atrial Flutter occurred continuously (100% burden), ranging from 58-130 bpm (avg of 63 bpm). Ventricular Pacing was present. Isolated VEs were occasional (2.4%, 6112), VE Couplets were occasional (1.1%, 1473), and VE Triplets were rare (<1.0%, 2). Ventricular Bigeminy and Trigeminy were present.            EKG:  EKG is ordered today.  The ekg ordered today demonstrates atrial fibrillation 67 bpm, LVH with QRS widening, right bundle branch block with left anterior fascicular block  Recent Labs: 02/18/2022: ALT  11 06/29/2022: BUN 11; Creatinine, Ser 0.92; Hemoglobin 11.3; Platelets 243; Potassium 4.7; Sodium 143  Recent Lipid Panel    Component Value Date/Time   CHOL 131 04/13/2021 0109   CHOL 158 09/02/2017 1006   TRIG 27  04/13/2021 0109   HDL 62 04/13/2021 0109   HDL 72 09/02/2017 1006   CHOLHDL 2.1 04/13/2021 0109   VLDL 5 04/13/2021 0109   LDLCALC 64 04/13/2021 0109   LDLCALC 68 09/02/2017 1006   LDLDIRECT 117.9 11/16/2010 1355     Risk Assessment/Calculations:    CHA2DS2-VASc Score = 5   This indicates a 7.2% annual risk of stroke. The patient's score is based upon: CHF History: 1 HTN History: 1 Diabetes History: 0 Stroke History: 0 Vascular Disease History: 1 Age Score: 2 Gender Score: 0           Physical Exam:    VS:  BP (!) 142/92   Pulse 67   Ht 6\' 1"  (1.854 m)   Wt 215 lb (97.5 kg)   SpO2 95%   BMI 28.37 kg/m     Wt Readings from Last 3 Encounters:  06/29/22 215 lb (97.5 kg)  05/18/22 213 lb (96.6 kg)  04/22/22 216 lb 9.6 oz (98.2 kg)     GEN:  Well nourished, well developed pleasant elderly male in no acute distress HEENT: Normal NECK: No JVD; No carotid bruits LYMPHATICS: No lymphadenopathy CARDIAC: Irregularly irregular with a grade 2/6 systolic ejection murmur at the right upper sternal border RESPIRATORY:  Clear to auscultation without rales, wheezing or rhonchi  ABDOMEN: Soft, non-tender, non-distended MUSCULOSKELETAL:  No edema; No deformity  SKIN: Warm and dry NEUROLOGIC:  Alert and oriented x 3 PSYCHIATRIC:  Normal affect   ASSESSMENT:    1. Coronary artery disease involving native coronary artery of native heart with angina pectoris (HCC)   2. Pre-procedural cardiovascular examination   3. Secondary hypercoagulable state (HCC)   4. S/P AVR (aortic valve replacement)   5. Chronic diastolic heart failure (HCC)   6. Essential hypertension   7. Atypical atrial flutter (HCC)    PLAN:    In order of problems listed above:  The patient reports progressive symptoms of angina with low-level exertion, exacerbated by eating a meal.  He is on maximally tolerated medical therapy.  I reviewed his last cardiac catheterization films which demonstrated  nonobstructive proximal to mid LAD stenosis, patency of the stent in the circumflex, and patency of the stent in the distal RCA.  I think cardiac catheterization and possible PCI is indicated in order to better evaluate the patient's worsening anginal symptoms and rule out obstructive coronary artery disease. I have reviewed the risks, indications, and alternatives to cardiac catheterization, possible angioplasty, and stenting with the patient. Risks include but are not limited to bleeding, infection, vascular injury, stroke, myocardial infection, arrhythmia, kidney injury, radiation-related injury in the case of prolonged fluoroscopy use, emergency cardiac surgery, and death. The patient understands the risks of serious complication is 1-2 in 1000 with diagnostic cardiac cath and 1-2% or less with angioplasty/stenting.  He will be advised to hold apixaban the day before and morning of his cardiac catheterization procedure. As above, update labs and hold apixaban before cardiac catheterization. Secondary to underlying atrial fibrillation.  Tolerating apixaban without problems. Most recent echocardiogram reviewed with normal function of his aortic valve bioprosthesis, mean transvalvular gradient less than 10 mmHg and no valvular regurgitation. Stable on current medical regimen.  Appears euvolemic. Continue current antihypertensive regimen.  At  times of had to treat him with permissive hypertension because of his orthostasis. Heart rate controlled.  Continue high-dose beta-blockade.      Shared Decision Making/Informed Consent The risks [stroke (1 in 1000), death (1 in 1000), kidney failure [usually temporary] (1 in 500), bleeding (1 in 200), allergic reaction [possibly serious] (1 in 200)], benefits (diagnostic support and management of coronary artery disease) and alternatives of a cardiac catheterization were discussed in detail with Mr. Steininger and he is willing to proceed.    Medication  Adjustments/Labs and Tests Ordered: Current medicines are reviewed at length with the patient today.  Concerns regarding medicines are outlined above.  Orders Placed This Encounter  Procedures   CBC   Basic metabolic panel   Uric acid   EKG 12-Lead   Meds ordered this encounter  Medications   predniSONE (DELTASONE) 50 MG tablet    Sig: Take 1 tablet by mouth 13 hours prior, 7 hours prior, and 1 hour prior to procedure    Dispense:  3 tablet    Refill:  0   diphenhydrAMINE (BENADRYL) 25 MG tablet    Sig: Take 2 tablets (50 mg total) by mouth one hour prior to procedure    Dispense:  100 tablet    Refill:  0    Patient Instructions  Medication Instructions:  Your physician recommends that you continue on your current medications as directed. Please refer to the Current Medication list given to you today.  *If you need a refill on your cardiac medications before your next appointment, please call your pharmacy*  Lab Work: CBC, BMET, Uric Acid Today If you have labs (blood work) drawn today and your tests are completely normal, you will receive your results only by: MyChart Message (if you have MyChart) OR A paper copy in the mail If you have any lab test that is abnormal or we need to change your treatment, we will call you to review the results.  Testing/Procedures: L Heart Catheterization Your physician has requested that you have a cardiac catheterization. Cardiac catheterization is used to diagnose and/or treat various heart conditions. Doctors may recommend this procedure for a number of different reasons. The most common reason is to evaluate chest pain. Chest pain can be a symptom of coronary artery disease (CAD), and cardiac catheterization can show whether plaque is narrowing or blocking your heart's arteries. This procedure is also used to evaluate the valves, as well as measure the blood flow and oxygen levels in different parts of your heart. For further information  please visit [email or web address] Please follow instruction sheet, as given.  Follow-Up: At [name], you and your health needs are our priority.  As part of our continuing mission to provide you with exceptional heart care, we have created designated Provider Care Teams.  These Care Teams include your primary Cardiologist (physician) and Advanced Practice Providers (APPs -  Physician Assistants and Nurse Practitioners) who all work together to provide you with the care you need, when you need it.  Your next appointment:   3 month(s)  Provider:   Tonny Bollman, [name]  Other Instructions       Cardiac/Peripheral Catheterization   You are scheduled for a Cardiac Catheterization on Tuesday, May 7 with Dr. Tonny Bollman.  1. Please arrive at the [facility] (Main Entrance A) at [facility]: [address] [place], [place] [address] at 6:00 AM (This time is 2 hour(s) before your procedure to ensure your preparation). Free valet parking  service is available. You will check in at ADMITTING. The support person will be asked to wait in the waiting room.  It is OK to have someone drop you off and come back when you are ready to be discharged.        Special note: Every effort is made to have your procedure done on time. Please understand that emergencies sometimes delay scheduled procedures.  2. Diet: Do not eat solid foods after midnight.  You may have clear liquids until 5 AM the day of the procedure.  3. Labs: TODAY  4. Medication instructions in preparation for your procedure:  DO NOT TAKE Eliquis for one day prior to procedure and hold the day of procedure (last dose 07/03/21, resume 07/07/22) DO NOT TAKE Potassium the day of procedure Contrast Allergy: Please take Prednisone 50mg  by mouth at: Thirteen hours prior to cath 7:00pm on Monday Seven hours prior to cath 1:00am on Tuesday And prior to leaving home please take last dose of Prednisone 50mg  and Benadryl 50mg  by  mouth. On the morning of your procedure, take Aspirin 81 mg and any morning medicines NOT listed above.  You may use sips of water.  5. Plan to go home the same day, you will only stay overnight if medically necessary. 6. You MUST have a responsible adult to drive you home. 7. An adult MUST be with you the first 24 hours after you arrive home. 8. Bring a current list of your medications, and the last time and date medication taken. 9. Bring ID and current insurance cards. 10.Please wear clothes that are easy to get on and off and wear slip-on shoes.  Thank you for allowing [place] to care for you!   -- [facility]    Signed, Tonny Bollman, [name]  07/02/2022 9:41 AM    [facility]

## 2022-06-29 NOTE — Progress Notes (Signed)
Cardiology Office Note:    Date:  07/02/2022   ID:  Kevin Kass, [name], DOB Apr 23, 1934, MRN [id number]  PCP:  Mahlon Gammon, [name]   [facility] Providers Cardiologist:  Jodelle Red, [name]     Referring [name]: Mahlon Gammon, [name]   Chief Complaint  Patient presents with   Chest Pain   Coronary Artery Disease    History of Present Illness:    Kevin Kass, [name] is a 88 y.o. male with a hx of  longstanding atypical atrial flutter and atrial tachycardia, coronary artery disease with chronic angina, aortic valve disease status post bioprosthetic aortic valve replacement, orthostatic hypotension, and chronic diastolic heart failure, presenting for evaluation of chest pain.   The patient is here with his home nurse, Selena Batten, today.  They contacted [place] because of Dr. Henrene Hawking progressive chest discomfort.  He had an episode about 72 hours ago where he experienced fairly severe substernal chest pressure that occurred when he was walking to his car after eating dinner.  He has had postprandial angina, but symptoms have been worse over the past few months.  He had a long walk to his car and developed more severe symptoms.  He had to stop and rest, reporting that it took 15 to 30 minutes for his symptoms to finally resolve.  The patient has not had resting angina, but he is limited by chest discomfort and has to eat small meals now.  He is walking shorter distances.  He complains of exertional dyspnea as well as fatigue.  No edema, orthopnea, PND, or heart palpitations.  He has had issues with labile blood pressure and orthostatic hypotension, but his blood pressure has been reasonably well-controlled of late.  Past Medical History:  Diagnosis Date   Aortic stenosis    moderate aortic stenosis   Arthritis    Benign prostatic hypertrophy    Chronic systolic heart failure (HCC)    Chronotropic incompetence with sinus node dysfunction    Status post Guidant dual-mode, dual-pacing,  dual-sensing  pacemaker   implantation now programmed to AAI with recent generator change.   Coronary artery disease    status post multiple prior percutaneous coronary interventions, microvascular angina per Dr [name]   Diverticulitis sigmoid colon recurrent    Dysfunctional autonomic nervous system    Dyspnea    Heart murmur    History of primary hypertension    Hypercoagulable state (HCC)    chronically anticoagulated with coumadin   Hyperlipidemia    Hyperthyroidism    Hypothyroidism    Dr. [name]   MGUS (monoclonal gammopathy of unknown significance) 02/17/2013   Ocular myasthenia (HCC)    Osteoarthritis    Paroxysmal atrial fibrillation (HCC)    DR. Riley Kill,    Prediabetes 09/21/2017   Stroke (HCC)    1990    Past Surgical History:  Procedure Laterality Date   AORTIC VALVE REPLACEMENT  03/15/2011   Procedure: AORTIC VALVE REPLACEMENT (AVR);  Surgeon: Alleen Borne, [name];  Location: [facility] OR;  Service: Open Heart Surgery;  Laterality: N/A;   APPENDECTOMY     CARDIAC CATHETERIZATION     11   CARDIOVERSION     CARDIOVERSION  04/15/2011   Procedure: CARDIOVERSION;  Surgeon: Marca Ancona, [name];  Location: [facility] ENDOSCOPY;  Service: Cardiovascular;  Laterality: N/A;   CARDIOVERSION N/A 09/11/2014   Procedure: CARDIOVERSION;  Surgeon: Quintella Reichert, [name];  Location: [facility] ENDOSCOPY;  Service: Cardiovascular;  Laterality: N/A;   CARDIOVERSION N/A 06/27/2015  Procedure: CARDIOVERSION;  Surgeon: Vesta Mixer, [name];  Location: [facility] ENDOSCOPY;  Service: Cardiovascular;  Laterality: N/A;   CARDIOVERSION N/A 07/04/2015   Procedure: CARDIOVERSION;  Surgeon: Marinus Maw, [name];  Location: [facility] ENDOSCOPY;  Service: Cardiovascular;  Laterality: N/A;   CARDIOVERSION N/A 04/13/2017   Procedure: CARDIOVERSION;  Surgeon: Quintella Reichert, [name];  Location: [facility] ENDOSCOPY;  Service: Cardiovascular;  Laterality: N/A;   COLONOSCOPY     CORONARY STENT INTERVENTION N/A 10/15/2019   Procedure: CORONARY STENT  INTERVENTION;  Surgeon: Corky Crafts, [name];  Location: [facility] INVASIVE CV LAB;  Service: Cardiovascular;  Laterality: N/A;   EP IMPLANTABLE DEVICE N/A 06/16/2015   Procedure: Pacemaker Implant;  Surgeon: Marinus Maw, [name];  Location: [facility] INVASIVE CV LAB;  Service: Cardiovascular;  Laterality: N/A;   hemrrhoidectomy     LEFT AND RIGHT HEART CATHETERIZATION WITH CORONARY ANGIOGRAM Bilateral 02/01/2011   Procedure: LEFT AND RIGHT HEART CATHETERIZATION WITH CORONARY ANGIOGRAM;  Surgeon: Herby Abraham, [name];  Location: [facility] CATH LAB;  Service: Cardiovascular;  Laterality: Bilateral;   LEFT HEART CATH AND CORONARY ANGIOGRAPHY N/A 10/15/2019   Procedure: LEFT HEART CATH AND CORONARY ANGIOGRAPHY;  Surgeon: Corky Crafts, [name];  Location: [facility] INVASIVE CV LAB;  Service: Cardiovascular;  Laterality: N/A;   LEFT HEART CATH AND CORONARY ANGIOGRAPHY N/A 10/07/2020   Procedure: LEFT HEART CATH AND CORONARY ANGIOGRAPHY;  Surgeon: Tonny Bollman, [name];  Location: [facility] INVASIVE CV LAB;  Service: Cardiovascular;  Laterality: N/A;   LUMBAR LAMINECTOMY/DECOMPRESSION MICRODISCECTOMY Right 02/23/2021   Procedure: Right Lumbar four-five microdiscectomy;  Surgeon: Tia Alert, [name];  Location: [facility] OR;  Service: Neurosurgery;  Laterality: Right;   MAZE  03/15/2011   Procedure: MAZE;  Surgeon: Alleen Borne, [name];  Location: [facility] OR;  Service: Open Heart Surgery;  Laterality: N/A;   MOHS SURGERY  09/2021   PACEMAKER INSERTION  1991   Guidant PPM, most recent Generator Change by Dr [name] was 08/22/06   RIGHT/LEFT HEART CATH AND CORONARY ANGIOGRAPHY N/A 07/06/2016   Procedure: Right/Left Heart Cath and Coronary Angiography;  Surgeon: Tonny Bollman, [name];  Location: [facility] INVASIVE CV LAB;  Service: Cardiovascular;  Laterality: N/A;   TEE WITHOUT CARDIOVERSION  04/15/2011   Procedure: TRANSESOPHAGEAL ECHOCARDIOGRAM (TEE);  Surgeon: Marca Ancona, [name];  Location: [facility] ENDOSCOPY;  Service: Cardiovascular;  Laterality: N/A;   TEE  WITHOUT CARDIOVERSION N/A 09/11/2014   Procedure: TRANSESOPHAGEAL ECHOCARDIOGRAM (TEE);  Surgeon: Quintella Reichert, [name];  Location[facility] ENDOSCOPY;  Service: Cardiovascular;  Laterality: N/A;    Current Medications: Current Meds  Medication Sig   amLODipine (NORVASC) 2.5 MG tablet Take 0.5 tablets (1.25 mg total) by mouth daily. (Patient taking differently: Take 1.25 mg by mouth as needed.)   amoxicillin (AMOXIL) 500 MG capsule Take 4 capsules by mouth 1 hour prior to dental appointment. (Patient taking differently: Take 500 mg by mouth as needed.)   amoxicillin-clavulanate (AUGMENTIN) 875-125 MG tablet Take 1 tablet by mouth 2 (two) times daily. (Patient taking differently: Take 1 tablet by mouth 2 (two) times daily. Takes it only for Diverticulitis Flare)   apixaban (ELIQUIS) 5 MG TABS tablet Take 1 tablet (5 mg total) by mouth 2 (two) times daily.   butalbital-acetaminophen-caffeine (FIORICET) 50-325-40 MG tablet Take 1 tablet by mouth twice daily as needed for headache.   Cholecalciferol (VITAMIN D3) 2000 units capsule Take 2,000 Units by mouth daily.   cilostazol (PLETAL) 100 MG tablet Take 1 tablet (100 mg total) by mouth 2 (two) times daily.  Coenzyme Q10 200 MG TABS Take 200 mg by mouth daily.   desoximetasone (TOPICORT) 0.25 % cream Apply 1 Application topically 2 (two) times daily.   diclofenac Sodium (VOLTAREN) 1 % GEL Apply 2 g topically 2 (two) times daily as needed (knee pain).   digoxin (LANOXIN) 0.125 MG tablet Take 1 tablet (0.125 mg total) by mouth daily.   diphenhydrAMINE (BENADRYL) 25 MG tablet Take 2 tablets (50 mg total) by mouth one hour prior to procedure   docusate sodium (COLACE) 100 MG capsule Take 100 mg by mouth 2 (two) times daily.   doxazosin (CARDURA) 4 MG tablet 1 tablet PO QD   ezetimibe (ZETIA) 10 MG tablet Take 1 tablet (10 mg total) by mouth daily.   HYDROcodone-acetaminophen (NORCO) 7.5-325 MG tablet Take 1 tablet by mouth every 8 (eight) hours. As needed for  pain   LORazepam (ATIVAN) 1 MG tablet Take 1 tablet (1 mg) by mouth at bedtime, and 0.5 tablet (0.5) daily as needed for anxiety.   magnesium oxide (MAG-OX) 400 MG tablet Take 1 tablet (400 mg total) by mouth daily.   metoprolol succinate (TOPROL XL) 25 MG 24 hr tablet Take 3 tablets (75 mg total) by mouth 2 (two) times daily.   midodrine (PROAMATINE) 2.5 MG tablet Take 1 tablet (2.5 mg total) by mouth 2 (two) times daily with a meal. (Patient taking differently: Take 2.5 mg by mouth 2 (two) times daily as needed.)   Multiple Vitamins-Minerals (MULTIVITAMIN WITH MINERALS) tablet Take 1 tablet by mouth daily.   nitroGLYCERIN (NITROSTAT) 0.4 MG SL tablet PLACE 1 TABLET UNDER THE TONGUE EVERY 5 MINUTES AS NEEDED FOR CHEST PAIN.   NON FORMULARY Zegrid Take one tablet by mouth once daily as needed.   pantoprazole (PROTONIX) 40 MG tablet Take 1 tablet (40 mg total) by mouth 2 (two) times daily.   polyethylene glycol (MIRALAX / GLYCOLAX) 17 g packet Take 17 g by mouth daily.   potassium chloride SA (KLOR-CON M) 20 MEQ tablet TAKE 1 TABLET BY MOUTH DAILY.   predniSONE (DELTASONE) 5 MG tablet Take 5 mg by mouth daily with breakfast.   predniSONE (DELTASONE) 50 MG tablet Take 1 tablet by mouth 13 hours prior, 7 hours prior, and 1 hour prior to procedure   rosuvastatin (CRESTOR) 20 MG tablet Take 1 tablet (20 mg total) by mouth at bedtime. Please keep scheduled appointment for future refills. Thank you.     Allergies:   Contrast media [iodinated contrast media], Gadolinium derivatives, Iodine-131, Metrizamide, and Tetanus toxoids   Social History   Socioeconomic History   Marital status: Married    Spouse name: Dondra Spry    Number of children: 3   Years of education: Not on file   Highest education level: Not on file  Occupational History   Occupation: Retired    Comment: Physician  Tobacco Use   Smoking status: Never   Smokeless tobacco: Never  Vaping Use   Vaping Use: Never used  Substance and  Sexual Activity   Alcohol use: No    Alcohol/week: 0.0 standard drinks of alcohol   Drug use: No   Sexual activity: Not Currently  Other Topics Concern   Not on file  Social History Narrative   Married to [place]. Has grown children   Retired Proofreader [name]      Never smoker no alcohol      Social Determinants of Corporate investment banker Strain: Not on file  Food Insecurity: Not on file  Transportation  Needs: Not on file  Physical Activity: Not on file  Stress: Not on file  Social Connections: Not on file     Family History: The patient's family history includes Anorexia nervosa in his daughter; Atrial fibrillation in his brother; CAD in his brother; Depression in his daughter; Heart disease in his brother; Hypertension in his son; Sarcoidosis in his brother. There is no history of Anesthesia problems, Hypotension, Malignant hyperthermia, or Pseudochol deficiency.  ROS:   Please see the history of present illness.    Positive for diffuse joint aches.  All other systems reviewed and are negative.  EKGs/Labs/Other Studies Reviewed:    The following studies were reviewed today: Cardiac Studies & Procedures   CARDIAC CATHETERIZATION  CARDIAC CATHETERIZATION 10/07/2020  Narrative   Prox RCA lesion is 30% stenosed.   Mid RCA lesion is 40% stenosed.   Prox LAD to Mid LAD lesion is 40% stenosed.   Previously placed Mid Cx to Dist Cx stent (unknown type) is  widely patent.   Non-stenotic Dist RCA lesion was previously treated.  Patent left main with no stenosis Patent LAD with mild-moderate proximal stenosis unchanged from previous cardiac cath studies Patent LCx stent with no significant stenosis Patent RCA with mild diffuse plaquing and patent stent in the distal RCA Normal LV filling pressure and no transaortic gradient across bioprosthetic aortic valve  Recommend: medical therapy.  Note the patient has frequent runs of PAT throughout the procedure with HR above 160 bpm  and drop in blood pressure down to the 70's during these runs  Findings Coronary Findings Diagnostic  Dominance: Right  Left Main Vessel is angiographically normal. Patent left main with mild calcification and no stenosis.  Left Anterior Descending Prox LAD to Mid LAD lesion is 40% stenosed. Diffuse calcific disease in the proximal LAD is present. This is mild-moderate in severity and unchanged from prior cath studies. Stenosis estimated at 40-50%.  Left Circumflex Previously placed Mid Cx to Dist Cx stent (unknown type) is  widely patent. Stented segment in the LCx remains patent with no significant stenosis  Right Coronary Artery There is mild diffuse disease throughout the vessel. Large, dominant vessel. There are diffuse irregularities with mild stenoses in the proximal and mid vessel. The distal vessel is widely patent. The PDA and PLA branches are patent. The stented segment in the distal RCA is patent with no stenosis. Prox RCA lesion is 30% stenosed. The lesion is calcified. Mid RCA lesion is 40% stenosed. The lesion is calcified. No change from prior study Non-stenotic Dist RCA lesion was previously treated. Stent remains widely patent with no restenosis  Intervention  No interventions have been documented.   CARDIAC CATHETERIZATION  CARDIAC CATHETERIZATION 10/15/2019  Narrative  Prox LAD to Mid LAD lesion is 40% stenosed.  Prox RCA lesion is 30% stenosed.  Mid RCA lesion is 40% stenosed.  Previously placed Mid Cx to Dist Cx stent (unknown type) is widely patent.  Dist RCA lesion is 75% stenosed. This was new compared to prior cath in 2018.  A drug-eluting stent was successfully placed using a SYNERGY XD 2.75X16. THis was dilated to 3.0 mm and postdilated to 3.4 mm.  Post intervention, there is a 0% residual stenosis.  There is moderate left ventricular systolic dysfunction.  LV end diastolic pressure is normal.  The left ventricular ejection fraction is  35-45% by visual estimate. Global hypokinesis.  No aspirin given Eliquis and Plavix.  Could use Plavix longer if no bleeding issues, but bare minimum  would be one month.  Continue aggressive secondary prevention and management of his tachycardia.  Findings Coronary Findings Diagnostic  Dominance: Right  Left Main Vessel is angiographically normal.  Left Anterior Descending Prox LAD to Mid LAD lesion is 40% stenosed. There is mild diffuse proximal LAD stenosis unchanged from previous study. The LAD wraps around the LV apex  Left Circumflex Previously placed Mid Cx to Dist Cx stent (unknown type) is widely patent.  Right Coronary Artery There is mild diffuse disease throughout the vessel. Large, dominant vessel. There are diffuse irregularities with mild stenoses in the proximal and mid vessel. The distal vessel is widely patent. The PDA and PLA branches are patent. Prox RCA lesion is 30% stenosed. The lesion is calcified. Mid RCA lesion is 40% stenosed. The lesion is calcified. Dist RCA lesion is 75% stenosed.  Intervention  Dist RCA lesion Stent CATH LAUNCHER 6FR JR4 guide catheter was inserted. Lesion crossed with guidewire using a WIRE ASAHI PROWATER 180CM. Pre-stent angioplasty was performed using a BALLOON SAPPHIRE 2.5X12. A drug-eluting stent was successfully placed using a SYNERGY XD 2.75X16. Stent strut is well apposed. Post-stent angioplasty was performed using a BALLOON SAPPHIRE [place] 3.25X12. Post-Intervention Lesion Assessment The intervention was successful. Pre-interventional TIMI flow is 3. Post-intervention TIMI flow is 3. No complications occurred at this lesion. There is a 0% residual stenosis post intervention.   STRESS TESTS  MYOCARDIAL PERFUSION IMAGING 02/13/2019  Narrative  Nuclear stress EF: 48%. Visually, the EF appears to be normal. Echo from today shows normal EF of 55-60%.  There was no ST segment deviation noted during stress.  This is a low risk  study. There is no evidence of ischemia or previous infarction.  The study is normal.   ECHOCARDIOGRAM  ECHOCARDIOGRAM COMPLETE 04/06/2022  Narrative ECHOCARDIOGRAM REPORT    Patient Name:   BRAESON KETNER Date of Exam: 04/06/2022 Medical Rec #:  [id number]        Height:       73.0 in Accession #:    [id number]       Weight:       226.1 lb Date of Birth:  1934-11-02         BSA:          2.267 m Patient Age:    87 years         BP:           98/58 mmHg Patient Gender: M                HR:           66 bpm. Exam Location:  [address]  Procedure: 2D Echo, Cardiac Doppler, Color Doppler and Strain Analysis  Indications:    I50.32 CHF  History:        Patient has prior history of Echocardiogram examinations, most recent 04/13/2021. CHF, CAD, Pacemaker, Stroke, Aortic Valve Disease, Arrythmias:Atrial Fibrillation, Signs/Symptoms:Shortness of Breath, Fatigue and Murmur; Risk Factors:Family History of Coronary Artery Disease, Hypertension and Dyslipidemia. Ocular Myasthenia, Aortic Stenosis status post Aortic Valve Replacement (2013). Aortic Valve: 25 mm Edwards MagnaEase valve is present in the aortic position. Procedure Date: 2013.  Sonographer:    Farrel Conners RDCS Referring Phys: Freddie Breech GUPTA  IMPRESSIONS   1. Left ventricular ejection fraction, by estimation, is 60 to 65%. Left ventricular ejection fraction by PLAX is 65 %. The left ventricle has normal function. The left ventricle has no regional wall motion abnormalities. There is mild left ventricular hypertrophy. Left  ventricular diastolic function could not be evaluated. 2. Right ventricular systolic function is normal. The right ventricular size is moderately enlarged. There is moderately elevated pulmonary artery systolic pressure. The estimated right ventricular systolic pressure is 45.9 mmHg. 3. Left atrial size was severely dilated. 4. Right atrial size was severely dilated. 5. The mitral valve is abnormal.  Mild mitral valve regurgitation. Moderate to severe mitral annular calcification. 6. The aortic valve has been repaired/replaced. Aortic valve regurgitation is trivial. There is a 25 mm Edwards MagnaEase valve present in the aortic position. Procedure Date: 2013. Echo findings are consistent with normal structure and function of the aortic valve prosthesis. Aortic valve mean gradient measures 8.0 mmHg. Aortic valve Vmax measures 1.91 m/s. Peak gradient 14.6 mmHg. DI is 0.44. 7. Aortic dilatation noted. There is mild dilatation of the ascending aorta, measuring 43 mm. 8. The inferior vena cava is dilated in size with <50% respiratory variability, suggesting right atrial pressure of 15 mmHg.  Comparison(s): Changes from prior study are noted. 04/13/2021: LVEF 60-65%, AVR with mean gradient 14 mmHg, RVSP 41.6 mmHg.  FINDINGS Left Ventricle: Left ventricular ejection fraction, by estimation, is 60 to 65%. Left ventricular ejection fraction by PLAX is 65 %. The left ventricle has normal function. The left ventricle has no regional wall motion abnormalities. The left ventricular internal cavity size was normal in size. There is mild left ventricular hypertrophy. Left ventricular diastolic function could not be evaluated due to atrial fibrillation. Left ventricular diastolic function could not be evaluated.  Right Ventricle: The right ventricular size is moderately enlarged. No increase in right ventricular wall thickness. Right ventricular systolic function is normal. There is moderately elevated pulmonary artery systolic pressure. The tricuspid regurgitant velocity is 2.78 m/s, and with an assumed right atrial pressure of 15 mmHg, the estimated right ventricular systolic pressure is 45.9 mmHg.  Left Atrium: Left atrial size was severely dilated.  Right Atrium: Right atrial size was severely dilated.  Pericardium: There is no evidence of pericardial effusion.  Mitral Valve: The mitral valve is  abnormal. Moderate to severe mitral annular calcification. Mild mitral valve regurgitation.  Tricuspid Valve: The tricuspid valve is grossly normal. Tricuspid valve regurgitation is trivial.  Aortic Valve: The aortic valve has been repaired/replaced. Aortic valve regurgitation is trivial. Aortic valve mean gradient measures 8.0 mmHg. Aortic valve peak gradient measures 14.6 mmHg. Aortic valve area, by VTI measures 2.00 cm. There is a 25 mm Edwards MagnaEase valve present in the aortic position. Procedure Date: 2013. Echo findings are consistent with normal structure and function of the aortic valve prosthesis.  Pulmonic Valve: The pulmonic valve was grossly normal. Pulmonic valve regurgitation is trivial.  Aorta: Aortic dilatation noted. There is mild dilatation of the ascending aorta, measuring 43 mm.  Venous: The inferior vena cava is dilated in size with less than 50% respiratory variability, suggesting right atrial pressure of 15 mmHg.  IAS/Shunts: No atrial level shunt detected by color flow Doppler.  Additional Comments: A device lead is visualized.   LEFT VENTRICLE PLAX 2D LV EF:         Left            Diastology ventricular     LV e' medial:    7.29 cm/s ejection        LV E/e' medial:  14.0 fraction by     LV e' lateral:   10.20 cm/s PLAX is 65      LV E/e' lateral: 10.0 %. LVIDd:  5.50 cm LVIDs:         3.50 cm LV PW:         1.20 cm LV IVS:        1.30 cm LVOT diam:     2.40 cm LV SV:         75 LV SV Index:   33 LVOT Area:     4.52 cm   RIGHT VENTRICLE RV Basal diam:  5.10 cm RV Mid diam:    3.70 cm RV S prime:     11.80 cm/s TAPSE (M-mode): 1.8 cm  LEFT ATRIUM              Index        RIGHT ATRIUM           Index LA diam:        5.70 cm  2.51 cm/m   RA Area:     29.80 cm LA Vol (A2C):   154.0 ml 67.94 ml/m  RA Volume:   110.00 ml 48.53 ml/m LA Vol (A4C):   78.4 ml  34.59 ml/m LA Biplane Vol: 114.0 ml 50.29 ml/m AORTIC VALVE AV Area (Vmax):     1.95 cm AV Area (Vmean):   1.89 cm AV Area (VTI):     2.00 cm AV Vmax:           191.00 cm/s AV Vmean:          127.000 cm/s AV VTI:            0.373 m AV Peak Grad:      14.6 mmHg AV Mean Grad:      8.0 mmHg LVOT Vmax:         82.18 cm/s LVOT Vmean:        52.960 cm/s LVOT VTI:          0.165 m LVOT/AV VTI ratio: 0.44  AORTA Ao Root diam: 3.50 cm Ao Asc diam:  4.30 cm  MITRAL VALVE                TRICUSPID VALVE MV Area (PHT): cm          TR Peak grad:   30.9 mmHg MV Decel Time: 242 msec     TR Vmax:        278.00 cm/s MV E velocity: 101.83 cm/s SHUNTS Systemic VTI:  0.16 m Systemic Diam: 2.40 cm  Zoila Shutter [name] Electronically signed by Zoila Shutter [name] Signature Date/Time: 04/06/2022/9:04:07 PM    Final    MONITORS  LONG TERM MONITOR (3-14 DAYS) 11/16/2020  Narrative 1. Atrial flutter with a CVR 2. Rare PVC's and NSVT 3. No prolonged pauses 4. Brief episodes of RVR with an ave HR of 63/min and briefly up to 130/min.   Gregg Taylor,[name]  Patch Wear Time:  2 days and 23 hours (2022-09-09T16:34:38-0400 to 2022-09-12T15:40:33-0400)  1 run of Ventricular Tachycardia occurred lasting 11 beats with a max rate of 169 bpm (avg 153 bpm). Atrial Flutter occurred continuously (100% burden), ranging from 58-130 bpm (avg of 63 bpm). Ventricular Pacing was present. Isolated VEs were occasional (2.4%, 6112), VE Couplets were occasional (1.1%, 1473), and VE Triplets were rare (<1.0%, 2). Ventricular Bigeminy and Trigeminy were present.            EKG:  EKG is ordered today.  The ekg ordered today demonstrates atrial fibrillation 67 bpm, LVH with QRS widening, right bundle branch block with left anterior fascicular block  Recent Labs: 02/18/2022: ALT  11 06/29/2022: BUN 11; Creatinine, Ser 0.92; Hemoglobin 11.3; Platelets 243; Potassium 4.7; Sodium 143  Recent Lipid Panel    Component Value Date/Time   CHOL 131 04/13/2021 0109   CHOL 158 09/02/2017 1006   TRIG 27  04/13/2021 0109   HDL 62 04/13/2021 0109   HDL 72 09/02/2017 1006   CHOLHDL 2.1 04/13/2021 0109   VLDL 5 04/13/2021 0109   LDLCALC 64 04/13/2021 0109   LDLCALC 68 09/02/2017 1006   LDLDIRECT 117.9 11/16/2010 1355     Risk Assessment/Calculations:    CHA2DS2-VASc Score = 5   This indicates a 7.2% annual risk of stroke. The patient's score is based upon: CHF History: 1 HTN History: 1 Diabetes History: 0 Stroke History: 0 Vascular Disease History: 1 Age Score: 2 Gender Score: 0           Physical Exam:    VS:  BP (!) 142/92   Pulse 67   Ht 6\' 1"  (1.854 m)   Wt 215 lb (97.5 kg)   SpO2 95%   BMI 28.37 kg/m     Wt Readings from Last 3 Encounters:  06/29/22 215 lb (97.5 kg)  05/18/22 213 lb (96.6 kg)  04/22/22 216 lb 9.6 oz (98.2 kg)     GEN:  Well nourished, well developed pleasant elderly male in no acute distress HEENT: Normal NECK: No JVD; No carotid bruits LYMPHATICS: No lymphadenopathy CARDIAC: Irregularly irregular with a grade 2/6 systolic ejection murmur at the right upper sternal border RESPIRATORY:  Clear to auscultation without rales, wheezing or rhonchi  ABDOMEN: Soft, non-tender, non-distended MUSCULOSKELETAL:  No edema; No deformity  SKIN: Warm and dry NEUROLOGIC:  Alert and oriented x 3 PSYCHIATRIC:  Normal affect   ASSESSMENT:    1. Coronary artery disease involving native coronary artery of native heart with angina pectoris (HCC)   2. Pre-procedural cardiovascular examination   3. Secondary hypercoagulable state (HCC)   4. S/P AVR (aortic valve replacement)   5. Chronic diastolic heart failure (HCC)   6. Essential hypertension   7. Atypical atrial flutter (HCC)    PLAN:    In order of problems listed above:  The patient reports progressive symptoms of angina with low-level exertion, exacerbated by eating a meal.  He is on maximally tolerated medical therapy.  I reviewed his last cardiac catheterization films which demonstrated  nonobstructive proximal to mid LAD stenosis, patency of the stent in the circumflex, and patency of the stent in the distal RCA.  I think cardiac catheterization and possible PCI is indicated in order to better evaluate the patient's worsening anginal symptoms and rule out obstructive coronary artery disease. I have reviewed the risks, indications, and alternatives to cardiac catheterization, possible angioplasty, and stenting with the patient. Risks include but are not limited to bleeding, infection, vascular injury, stroke, myocardial infection, arrhythmia, kidney injury, radiation-related injury in the case of prolonged fluoroscopy use, emergency cardiac surgery, and death. The patient understands the risks of serious complication is 1-2 in 1000 with diagnostic cardiac cath and 1-2% or less with angioplasty/stenting.  He will be advised to hold apixaban the day before and morning of his cardiac catheterization procedure. As above, update labs and hold apixaban before cardiac catheterization. Secondary to underlying atrial fibrillation.  Tolerating apixaban without problems. Most recent echocardiogram reviewed with normal function of his aortic valve bioprosthesis, mean transvalvular gradient less than 10 mmHg and no valvular regurgitation. Stable on current medical regimen.  Appears euvolemic. Continue current antihypertensive regimen.  At  times of had to treat him with permissive hypertension because of his orthostasis. Heart rate controlled.  Continue high-dose beta-blockade.      Shared Decision Making/Informed Consent The risks [stroke (1 in 1000), death (1 in 1000), kidney failure [usually temporary] (1 in 500), bleeding (1 in 200), allergic reaction [possibly serious] (1 in 200)], benefits (diagnostic support and management of coronary artery disease) and alternatives of a cardiac catheterization were discussed in detail with Mr. Steininger and he is willing to proceed.    Medication  Adjustments/Labs and Tests Ordered: Current medicines are reviewed at length with the patient today.  Concerns regarding medicines are outlined above.  Orders Placed This Encounter  Procedures   CBC   Basic metabolic panel   Uric acid   EKG 12-Lead   Meds ordered this encounter  Medications   predniSONE (DELTASONE) 50 MG tablet    Sig: Take 1 tablet by mouth 13 hours prior, 7 hours prior, and 1 hour prior to procedure    Dispense:  3 tablet    Refill:  0   diphenhydrAMINE (BENADRYL) 25 MG tablet    Sig: Take 2 tablets (50 mg total) by mouth one hour prior to procedure    Dispense:  100 tablet    Refill:  0    Patient Instructions  Medication Instructions:  Your physician recommends that you continue on your current medications as directed. Please refer to the Current Medication list given to you today.  *If you need a refill on your cardiac medications before your next appointment, please call your pharmacy*  Lab Work: CBC, BMET, Uric Acid Today If you have labs (blood work) drawn today and your tests are completely normal, you will receive your results only by: MyChart Message (if you have MyChart) OR A paper copy in the mail If you have any lab test that is abnormal or we need to change your treatment, we will call you to review the results.  Testing/Procedures: L Heart Catheterization Your physician has requested that you have a cardiac catheterization. Cardiac catheterization is used to diagnose and/or treat various heart conditions. Doctors may recommend this procedure for a number of different reasons. The most common reason is to evaluate chest pain. Chest pain can be a symptom of coronary artery disease (CAD), and cardiac catheterization can show whether plaque is narrowing or blocking your heart's arteries. This procedure is also used to evaluate the valves, as well as measure the blood flow and oxygen levels in different parts of your heart. For further information  please visit [email or web address] Please follow instruction sheet, as given.  Follow-Up: At [name], you and your health needs are our priority.  As part of our continuing mission to provide you with exceptional heart care, we have created designated Provider Care Teams.  These Care Teams include your primary Cardiologist (physician) and Advanced Practice Providers (APPs -  Physician Assistants and Nurse Practitioners) who all work together to provide you with the care you need, when you need it.  Your next appointment:   3 month(s)  Provider:   Tonny Bollman, [name]  Other Instructions       Cardiac/Peripheral Catheterization   You are scheduled for a Cardiac Catheterization on Tuesday, May 7 with Dr. Tonny Bollman.  1. Please arrive at the [facility] (Main Entrance A) at [facility]: [address] [place], [place] [address] at 6:00 AM (This time is 2 hour(s) before your procedure to ensure your preparation). Free valet parking  service is available. You will check in at ADMITTING. The support person will be asked to wait in the waiting room.  It is OK to have someone drop you off and come back when you are ready to be discharged.        Special note: Every effort is made to have your procedure done on time. Please understand that emergencies sometimes delay scheduled procedures.  2. Diet: Do not eat solid foods after midnight.  You may have clear liquids until 5 AM the day of the procedure.  3. Labs: TODAY  4. Medication instructions in preparation for your procedure:  DO NOT TAKE Eliquis for one day prior to procedure and hold the day of procedure (last dose 07/03/21, resume 07/07/22) DO NOT TAKE Potassium the day of procedure Contrast Allergy: Please take Prednisone 50mg  by mouth at: Thirteen hours prior to cath 7:00pm on Monday Seven hours prior to cath 1:00am on Tuesday And prior to leaving home please take last dose of Prednisone 50mg  and Benadryl 50mg  by  mouth. On the morning of your procedure, take Aspirin 81 mg and any morning medicines NOT listed above.  You may use sips of water.  5. Plan to go home the same day, you will only stay overnight if medically necessary. 6. You MUST have a responsible adult to drive you home. 7. An adult MUST be with you the first 24 hours after you arrive home. 8. Bring a current list of your medications, and the last time and date medication taken. 9. Bring ID and current insurance cards. 10.Please wear clothes that are easy to get on and off and wear slip-on shoes.  Thank you for allowing [place] to care for you!   -- [facility] services    Signed, Tonny Bollman, [name]  07/02/2022 9:41 AM    [facility]

## 2022-06-29 NOTE — Patient Instructions (Addendum)
Medication Instructions:  Your physician recommends that you continue on your current medications as directed. Please refer to the Current Medication list given to you today.  *If you need a refill on your cardiac medications before your next appointment, please call your pharmacy*  Lab Work: CBC, BMET, Uric Acid Today If you have labs (blood work) drawn today and your tests are completely normal, you will receive your results only by: MyChart Message (if you have MyChart) OR A paper copy in the mail If you have any lab test that is abnormal or we need to change your treatment, we will call you to review the results.  Testing/Procedures: L Heart Catheterization Your physician has requested that you have a cardiac catheterization. Cardiac catheterization is used to diagnose and/or treat various heart conditions. Doctors may recommend this procedure for a number of different reasons. The most common reason is to evaluate chest pain. Chest pain can be a symptom of coronary artery disease (CAD), and cardiac catheterization can show whether plaque is narrowing or blocking your heart's arteries. This procedure is also used to evaluate the valves, as well as measure the blood flow and oxygen levels in different parts of your heart. For further information please visit [email or web address] Please follow instruction sheet, as given.  Follow-Up: At [facility], you and your health needs are our priority.  As part of our continuing mission to provide you with exceptional heart care, we have created designated Provider Care Teams.  These Care Teams include your primary Cardiologist (physician) and Advanced Practice Providers (APPs -  Physician Assistants and Nurse Practitioners) who all work together to provide you with the care you need, when you need it.  Your next appointment:   3 month(s)  Provider:   Tonny Bollman, MD  Other Instructions       Cardiac/Peripheral  Catheterization   You are scheduled for a Cardiac Catheterization on Tuesday, May 7 with Dr. Tonny Bollman.  1. Please arrive at the [facility] (Main Entrance A) at [facility]: [address] [place], [place] [address] at 6:00 AM (This time is 2 hour(s) before your procedure to ensure your preparation). Free valet parking service is available. You will check in at ADMITTING. The support person will be asked to wait in the waiting room.  It is OK to have someone drop you off and come back when you are ready to be discharged.        Special note: Every effort is made to have your procedure done on time. Please understand that emergencies sometimes delay scheduled procedures.  2. Diet: Do not eat solid foods after midnight.  You may have clear liquids until 5 AM the day of the procedure.  3. Labs: TODAY  4. Medication instructions in preparation for your procedure:  DO NOT TAKE Eliquis for one day prior to procedure and hold the day of procedure (last dose 07/03/21, resume 07/07/22) DO NOT TAKE Potassium the day of procedure Contrast Allergy: Please take Prednisone 50mg  by mouth at: Thirteen hours prior to cath 7:00pm on Monday Seven hours prior to cath 1:00am on Tuesday And prior to leaving home please take last dose of Prednisone 50mg  and Benadryl 50mg  by mouth. On the morning of your procedure, take Aspirin 81 mg and any morning medicines NOT listed above.  You may use sips of water.  5. Plan to go home the same day, you will only stay overnight if medically necessary. 6. You MUST have a responsible adult  to drive you home. 7. An adult MUST be with you the first 24 hours after you arrive home. 8. Bring a current list of your medications, and the last time and date medication taken. 9. Bring ID and current insurance cards. 10.Please wear clothes that are easy to get on and off and wear slip-on shoes.  Thank you for allowing [place] to care for you!   -- [facility]

## 2022-06-30 LAB — BASIC METABOLIC PANEL
BUN/Creatinine Ratio: 12 (ref 10–24)
BUN: 11 mg/dL (ref 8–27)
CO2: 23 mmol/L (ref 20–29)
Calcium: 9.7 mg/dL (ref 8.6–10.2)
Chloride: 101 mmol/L (ref 96–106)
Creatinine, Ser: 0.92 mg/dL (ref 0.76–1.27)
Glucose: 119 mg/dL — ABNORMAL HIGH (ref 70–99)
Potassium: 4.7 mmol/L (ref 3.5–5.2)
Sodium: 143 mmol/L (ref 134–144)
eGFR: 82 mL/min/{1.73_m2} (ref >59)

## 2022-06-30 LAB — CBC
Hematocrit: 35.3 % — ABNORMAL LOW (ref 37.5–51.0)
Hemoglobin: 11.3 g/dL — ABNORMAL LOW (ref 13.0–17.7)
MCH: 27.5 pg (ref 26.6–33.0)
MCHC: 32 g/dL (ref 31.5–35.7)
MCV: 86 fL (ref 79–97)
Platelets: 243 10*3/uL (ref 150–450)
RBC: 4.11 x10E6/uL — ABNORMAL LOW (ref 4.14–5.80)
RDW: 14 % (ref 11.6–15.4)
WBC: 8.2 10*3/uL (ref 3.4–10.8)

## 2022-06-30 LAB — URIC ACID: Uric Acid: 5.7 mg/dL (ref 3.8–8.4)

## 2022-07-04 ENCOUNTER — Other Ambulatory Visit: Payer: Self-pay | Admitting: Cardiovascular Disease

## 2022-07-04 ENCOUNTER — Other Ambulatory Visit: Payer: Self-pay | Admitting: Internal Medicine

## 2022-07-05 ENCOUNTER — Other Ambulatory Visit (HOSPITAL_BASED_OUTPATIENT_CLINIC_OR_DEPARTMENT_OTHER): Payer: Self-pay

## 2022-07-05 ENCOUNTER — Other Ambulatory Visit: Payer: Self-pay

## 2022-07-05 ENCOUNTER — Telehealth: Payer: Self-pay | Admitting: *Deleted

## 2022-07-05 MED ORDER — PREDNISONE 5 MG PO TABS
5.0000 mg | ORAL_TABLET | Freq: Every day | ORAL | 3 refills | Status: DC
  Filled 2022-07-05: qty 30, 30d supply, fill #0
  Filled 2022-08-02: qty 30, 30d supply, fill #1
  Filled 2022-09-06: qty 30, 30d supply, fill #2
  Filled 2022-10-11: qty 30, 30d supply, fill #3

## 2022-07-05 MED ORDER — ROSUVASTATIN CALCIUM 20 MG PO TABS
20.0000 mg | ORAL_TABLET | Freq: Every day | ORAL | 3 refills | Status: DC
  Filled 2022-07-05: qty 90, 90d supply, fill #0
  Filled 2022-10-08: qty 90, 90d supply, fill #1
  Filled 2022-12-27: qty 90, 90d supply, fill #2
  Filled 2023-03-22: qty 90, 90d supply, fill #3

## 2022-07-05 MED ORDER — HYDROCODONE-ACETAMINOPHEN 7.5-325 MG PO TABS
1.0000 | ORAL_TABLET | Freq: Three times a day (TID) | ORAL | 0 refills | Status: DC
  Filled 2022-07-05: qty 90, 30d supply, fill #0

## 2022-07-05 MED ORDER — EZETIMIBE 10 MG PO TABS
10.0000 mg | ORAL_TABLET | Freq: Every day | ORAL | 3 refills | Status: DC
  Filled 2022-07-05: qty 90, 90d supply, fill #0
  Filled 2022-10-04: qty 90, 90d supply, fill #1
  Filled 2022-12-27: qty 90, 90d supply, fill #2
  Filled 2023-03-22: qty 90, 90d supply, fill #3

## 2022-07-05 NOTE — Telephone Encounter (Signed)
Pharmacy requested refill.  Epic LR: 06/03/2022 Contract Date: 10/06/2021  Pended Rx and sent to Dr. Chales Abrahams for approval.

## 2022-07-05 NOTE — Telephone Encounter (Signed)
Cardiac Catheterization scheduled at [facility] for: Tuesday Jul 06, 2022 8 AM Arrival time [facility] Main Entrance A at: 6 AM  Nothing to eat after midnight prior to procedure, clear liquids until 5 AM day of procedure.  CONTRAST ALLERGY: 13 hour Prednisone and Benadryl Prep: 07/05/22 Prednisone 50 mg 7 PM 07/06/22 Prednisone 50 mg 1 AM 07/06/22 Prednisone 50 mg and Benadryl 50 mg just prior to leaving for hospital. Pt advised not to drive after taking Benadryl 50 mg.  Medication instructions: -Hold:  Eliquis-none 07/05/22 until post procedure  Lasix/KCl-AM of procedure -Other usual morning medications can be taken with sips of water including aspirin 81 mg.  Confirmed patient has responsible adult to drive home post procedure and be with patient first 24 hours after arriving home.  Plan to go home the same day, you will only stay overnight if medically necessary.  Reviewed procedure instructions with patient.

## 2022-07-06 ENCOUNTER — Encounter (HOSPITAL_COMMUNITY): Payer: Self-pay | Admitting: Cardiovascular Disease

## 2022-07-06 ENCOUNTER — Other Ambulatory Visit: Payer: Self-pay

## 2022-07-06 ENCOUNTER — Other Ambulatory Visit: Payer: Self-pay | Admitting: Internal Medicine

## 2022-07-06 ENCOUNTER — Encounter (HOSPITAL_COMMUNITY)
Admission: RE | Disposition: A | Discharge status: Home / Self Care | Payer: Self-pay | Source: Home / Self Care | Attending: Cardiovascular Disease

## 2022-07-06 ENCOUNTER — Ambulatory Visit (HOSPITAL_COMMUNITY)
Admission: RE | Admit: 2022-07-06 | Discharge: 2022-07-06 | Disposition: A | Discharge status: Home / Self Care | Payer: Medicare Other | Attending: Cardiovascular Disease | Admitting: Cardiovascular Disease

## 2022-07-06 ENCOUNTER — Other Ambulatory Visit (HOSPITAL_BASED_OUTPATIENT_CLINIC_OR_DEPARTMENT_OTHER): Payer: Self-pay

## 2022-07-06 DIAGNOSIS — Z955 Presence of coronary angioplasty implant and graft: Secondary | ICD-10-CM | POA: Diagnosis not present

## 2022-07-06 DIAGNOSIS — D6859 Other primary thrombophilia: Secondary | ICD-10-CM | POA: Insufficient documentation

## 2022-07-06 DIAGNOSIS — I25110 Atherosclerotic heart disease of native coronary artery with unstable angina pectoris: Secondary | ICD-10-CM

## 2022-07-06 DIAGNOSIS — M158 Other polyosteoarthritis: Secondary | ICD-10-CM

## 2022-07-06 DIAGNOSIS — Z953 Presence of xenogenic heart valve: Secondary | ICD-10-CM | POA: Insufficient documentation

## 2022-07-06 DIAGNOSIS — I2584 Coronary atherosclerosis due to calcified coronary lesion: Secondary | ICD-10-CM | POA: Diagnosis not present

## 2022-07-06 DIAGNOSIS — I4891 Unspecified atrial fibrillation: Secondary | ICD-10-CM | POA: Diagnosis not present

## 2022-07-06 DIAGNOSIS — I110 Hypertensive heart disease with heart failure: Secondary | ICD-10-CM | POA: Insufficient documentation

## 2022-07-06 DIAGNOSIS — I5032 Chronic diastolic (congestive) heart failure: Secondary | ICD-10-CM | POA: Insufficient documentation

## 2022-07-06 DIAGNOSIS — I484 Atypical atrial flutter: Secondary | ICD-10-CM | POA: Insufficient documentation

## 2022-07-06 DIAGNOSIS — I4719 Other supraventricular tachycardia: Secondary | ICD-10-CM | POA: Diagnosis not present

## 2022-07-06 DIAGNOSIS — Z7901 Long term (current) use of anticoagulants: Secondary | ICD-10-CM | POA: Diagnosis not present

## 2022-07-06 HISTORY — PX: LEFT HEART CATH AND CORONARY ANGIOGRAPHY: CATH118249

## 2022-07-06 SURGERY — LEFT HEART CATH AND CORONARY ANGIOGRAPHY
Anesthesia: LOCAL

## 2022-07-06 MED ORDER — LABETALOL HCL 5 MG/ML IV SOLN
INTRAVENOUS | Status: DC | PRN
  Administered 2022-07-06: 10 mg via INTRAVENOUS

## 2022-07-06 MED ORDER — SODIUM CHLORIDE 0.9 % WEIGHT BASED INFUSION: 1.0000 mL/kg/h | INTRAVENOUS | Status: DC

## 2022-07-06 MED ORDER — FENTANYL CITRATE (PF) 100 MCG/2ML IJ SOLN
INTRAMUSCULAR | Status: DC | PRN
  Administered 2022-07-06: 25 ug via INTRAVENOUS

## 2022-07-06 MED ORDER — ACETAMINOPHEN 325 MG PO TABS: 650.0000 mg | ORAL_TABLET | ORAL | Status: DC | PRN

## 2022-07-06 MED ORDER — HYDRALAZINE HCL 20 MG/ML IJ SOLN
INTRAMUSCULAR | Status: DC | PRN
  Administered 2022-07-06: 10 mg via INTRAVENOUS

## 2022-07-06 MED ORDER — MIDAZOLAM HCL 2 MG/2ML IJ SOLN
INTRAMUSCULAR | Status: DC | PRN
  Administered 2022-07-06: 2 mg via INTRAVENOUS

## 2022-07-06 MED ORDER — FENTANYL CITRATE (PF) 100 MCG/2ML IJ SOLN
INTRAMUSCULAR | Status: AC
  Filled 2022-07-06: qty 2

## 2022-07-06 MED ORDER — SODIUM CHLORIDE 0.9% FLUSH: 3.0000 mL | Freq: Two times a day (BID) | INTRAVENOUS | Status: DC

## 2022-07-06 MED ORDER — SODIUM CHLORIDE 0.9% FLUSH: 3.0000 mL | INTRAVENOUS | Status: DC | PRN

## 2022-07-06 MED ORDER — SODIUM CHLORIDE 0.9 % IV SOLN: 250.0000 mL | INTRAVENOUS | Status: DC | PRN

## 2022-07-06 MED ORDER — HEPARIN SODIUM (PORCINE) 1000 UNIT/ML IJ SOLN
INTRAMUSCULAR | Status: AC
  Filled 2022-07-06: qty 10

## 2022-07-06 MED ORDER — LIDOCAINE HCL (PF) 1 % IJ SOLN
INTRAMUSCULAR | Status: DC | PRN
  Administered 2022-07-06: 5 mL

## 2022-07-06 MED ORDER — LABETALOL HCL 5 MG/ML IV SOLN: 10.0000 mg | INTRAVENOUS | Status: DC | PRN

## 2022-07-06 MED ORDER — DIAZEPAM 5 MG PO TABS: 5.0000 mg | ORAL_TABLET | Freq: Four times a day (QID) | ORAL | Status: DC | PRN

## 2022-07-06 MED ORDER — MIDAZOLAM HCL 2 MG/2ML IJ SOLN
INTRAMUSCULAR | Status: AC
  Filled 2022-07-06: qty 2

## 2022-07-06 MED ORDER — HEPARIN (PORCINE) IN NACL 1000-0.9 UT/500ML-% IV SOLN
INTRAVENOUS | Status: DC | PRN
  Administered 2022-07-06 (×2): 500 mL via INTRA_ARTERIAL

## 2022-07-06 MED ORDER — LABETALOL HCL 5 MG/ML IV SOLN
INTRAVENOUS | Status: AC
  Filled 2022-07-06: qty 4

## 2022-07-06 MED ORDER — ASPIRIN 81 MG PO CHEW: 81.0000 mg | CHEWABLE_TABLET | ORAL | Status: DC

## 2022-07-06 MED ORDER — HYDRALAZINE HCL 20 MG/ML IJ SOLN: 10.0000 mg | INTRAMUSCULAR | Status: DC | PRN

## 2022-07-06 MED ORDER — IOHEXOL 350 MG/ML SOLN
INTRAVENOUS | Status: DC | PRN
  Administered 2022-07-06: 70 mL via INTRA_ARTERIAL

## 2022-07-06 MED ORDER — OXYCODONE HCL 5 MG PO TABS: 5.0000 mg | ORAL_TABLET | ORAL | Status: DC | PRN

## 2022-07-06 MED ORDER — ONDANSETRON HCL 4 MG/2ML IJ SOLN: 4.0000 mg | Freq: Four times a day (QID) | INTRAMUSCULAR | Status: DC | PRN

## 2022-07-06 MED ORDER — HEPARIN SODIUM (PORCINE) 1000 UNIT/ML IJ SOLN
INTRAMUSCULAR | Status: DC | PRN
  Administered 2022-07-06: 5000 [IU] via INTRAVENOUS

## 2022-07-06 MED ORDER — VERAPAMIL HCL 2.5 MG/ML IV SOLN
INTRAVENOUS | Status: AC
  Filled 2022-07-06: qty 2

## 2022-07-06 MED ORDER — SODIUM CHLORIDE 0.9 % WEIGHT BASED INFUSION
3.0000 mL/kg/h | INTRAVENOUS | Status: AC
  Administered 2022-07-06: 3 mL/kg/h via INTRAVENOUS

## 2022-07-06 MED ORDER — LIDOCAINE HCL (PF) 1 % IJ SOLN
INTRAMUSCULAR | Status: AC
  Filled 2022-07-06: qty 30

## 2022-07-06 MED ORDER — VERAPAMIL HCL 2.5 MG/ML IV SOLN
INTRAVENOUS | Status: DC | PRN
  Administered 2022-07-06: 10 mL via INTRA_ARTERIAL

## 2022-07-06 SURGICAL SUPPLY — 9 items
CATH 5FR JL3.5 JR4 ANG PIG MP (CATHETERS) IMPLANT
DEVICE RAD COMP TR BAND LRG (VASCULAR PRODUCTS) IMPLANT
GLIDESHEATH SLEND SS 6F .021 (SHEATH) IMPLANT
GUIDEWIRE INQWIRE 1.5J.035X260 (WIRE) IMPLANT
INQWIRE 1.5J .035X260CM (WIRE) ×1
KIT HEART LEFT (KITS) ×2 IMPLANT
PACK CARDIAC CATHETERIZATION (CUSTOM PROCEDURE TRAY) ×2 IMPLANT
TRANSDUCER W/STOPCOCK (MISCELLANEOUS) ×2 IMPLANT
TUBING CIL FLEX 10 FLL-RA (TUBING) ×2 IMPLANT

## 2022-07-06 NOTE — Discharge Instructions (Signed)

## 2022-07-06 NOTE — Interval H&P Note (Signed)
History and Physical Interval Note:  07/06/2022 7:13 AM  Kerry Kass, MD  has presented today for surgery, with the diagnosis of unstable angina.  The various methods of treatment have been discussed with the patient and family. After consideration of risks, benefits and other options for treatment, the patient has consented to  Procedure(s): LEFT HEART CATH AND CORONARY ANGIOGRAPHY (N/A) as a surgical intervention.  The patient's history has been reviewed, patient examined, no change in status, stable for surgery.  I have reviewed the patient's chart and labs.  Questions were answered to the patient's satisfaction.     Tonny Bollman

## 2022-07-06 NOTE — Progress Notes (Signed)
Patient and nurse was given discharge instructions. Both verbalized understanding.

## 2022-07-06 NOTE — Progress Notes (Signed)
Remote pacemaker transmission.   

## 2022-07-13 ENCOUNTER — Telehealth: Payer: Self-pay | Admitting: Internal Medicine

## 2022-07-13 DIAGNOSIS — H34231 Retinal artery branch occlusion, right eye: Secondary | ICD-10-CM | POA: Diagnosis not present

## 2022-07-13 NOTE — Telephone Encounter (Signed)
Patient having chest pain (? Some abd pain) after eating  Recent cardiac cath non-occlusive dz  [name] raising concerns about mesneteric ischemia ' CT abd/pelvis w/ contrast last year - w/o reports of mesenteric vessel abnormalities  Have messaged Dr. Excell Seltzer re: could there be some sort of steal syndrome

## 2022-07-14 ENCOUNTER — Telehealth: Payer: Self-pay

## 2022-07-14 NOTE — Telephone Encounter (Signed)
Office called in asking for the last 3 office visits, I have faxed over the last 3 nursing home visits from pt's chart.

## 2022-07-14 NOTE — Telephone Encounter (Signed)
Rheumatology called and stated they need the last 3 notes from the referring physician sent to them in order to accept referral  Would like records faxed to [phone]

## 2022-07-15 NOTE — Telephone Encounter (Signed)
Reviewed w/ dr. 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 [name]

## 2022-07-20 ENCOUNTER — Other Ambulatory Visit: Payer: Self-pay

## 2022-07-22 ENCOUNTER — Other Ambulatory Visit (HOSPITAL_BASED_OUTPATIENT_CLINIC_OR_DEPARTMENT_OTHER): Payer: Self-pay

## 2022-07-22 ENCOUNTER — Other Ambulatory Visit: Payer: Self-pay | Admitting: Internal Medicine

## 2022-07-22 MED ORDER — LORAZEPAM 1 MG PO TABS
1.0000 mg | ORAL_TABLET | Freq: Two times a day (BID) | ORAL | 1 refills | Status: DC | PRN
  Filled 2022-07-22: qty 60, 30d supply, fill #0
  Filled 2022-09-06: qty 60, 30d supply, fill #1

## 2022-07-22 NOTE — Telephone Encounter (Signed)
Patient is requesting a refill of the following medications: Requested Prescriptions   Pending Prescriptions Disp Refills   LORazepam (ATIVAN) 1 MG tablet 60 tablet 1    Sig: Take 1 tablet (1 mg) by mouth at bedtime, and 0.5 tablet (0.5) daily as needed for anxiety.    Date of last refill:05/11/2022  Refill amount: 1  Treatment agreement date: Unknown

## 2022-08-02 ENCOUNTER — Other Ambulatory Visit: Payer: Self-pay

## 2022-08-02 ENCOUNTER — Other Ambulatory Visit (HOSPITAL_BASED_OUTPATIENT_CLINIC_OR_DEPARTMENT_OTHER): Payer: Self-pay

## 2022-08-02 ENCOUNTER — Other Ambulatory Visit: Payer: Self-pay | Admitting: Internal Medicine

## 2022-08-02 MED ORDER — HYDROCODONE-ACETAMINOPHEN 7.5-325 MG PO TABS
1.0000 | ORAL_TABLET | Freq: Three times a day (TID) | ORAL | 0 refills | Status: DC
  Filled 2022-08-02 – 2022-08-06 (×2): qty 90, 30d supply, fill #0

## 2022-08-02 NOTE — Telephone Encounter (Signed)
Patient is requesting a refill of the following medications: Requested Prescriptions   Pending Prescriptions Disp Refills   HYDROcodone-acetaminophen (NORCO) 7.5-325 MG tablet 90 tablet 0    Sig: Take 1 tablet by mouth every 8 (eight) hours. As needed for pain    Date of last refill: 07/05/2022  Refill amount: 0  Treatment agreement date: 10/06/2021

## 2022-08-05 ENCOUNTER — Other Ambulatory Visit (HOSPITAL_BASED_OUTPATIENT_CLINIC_OR_DEPARTMENT_OTHER): Payer: Self-pay

## 2022-08-06 ENCOUNTER — Other Ambulatory Visit (HOSPITAL_BASED_OUTPATIENT_CLINIC_OR_DEPARTMENT_OTHER): Payer: Self-pay

## 2022-08-06 ENCOUNTER — Other Ambulatory Visit: Payer: Self-pay

## 2022-08-12 ENCOUNTER — Other Ambulatory Visit (HOSPITAL_BASED_OUTPATIENT_CLINIC_OR_DEPARTMENT_OTHER): Payer: Self-pay

## 2022-08-12 DIAGNOSIS — M7021 Olecranon bursitis, right elbow: Secondary | ICD-10-CM | POA: Diagnosis not present

## 2022-08-12 DIAGNOSIS — Z6827 Body mass index (BMI) 27.0-27.9, adult: Secondary | ICD-10-CM | POA: Diagnosis not present

## 2022-08-12 DIAGNOSIS — M79641 Pain in right hand: Secondary | ICD-10-CM | POA: Diagnosis not present

## 2022-08-12 DIAGNOSIS — R5383 Other fatigue: Secondary | ICD-10-CM | POA: Diagnosis not present

## 2022-08-12 DIAGNOSIS — E663 Overweight: Secondary | ICD-10-CM | POA: Diagnosis not present

## 2022-08-12 DIAGNOSIS — G894 Chronic pain syndrome: Secondary | ICD-10-CM | POA: Diagnosis not present

## 2022-08-12 DIAGNOSIS — M79642 Pain in left hand: Secondary | ICD-10-CM | POA: Diagnosis not present

## 2022-08-12 DIAGNOSIS — M1991 Primary osteoarthritis, unspecified site: Secondary | ICD-10-CM | POA: Diagnosis not present

## 2022-08-12 DIAGNOSIS — M542 Cervicalgia: Secondary | ICD-10-CM | POA: Diagnosis not present

## 2022-08-19 ENCOUNTER — Telehealth: Payer: Self-pay | Admitting: Internal Medicine

## 2022-08-19 NOTE — Telephone Encounter (Signed)
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 Thyra Breed at [facility]. They apparently still don't have his records and he can't be seen there until they receive them. I faxed the last 2 year's worth of records to Dr. Vear Clock' office [phone]).

## 2022-08-20 ENCOUNTER — Other Ambulatory Visit (HOSPITAL_BASED_OUTPATIENT_CLINIC_OR_DEPARTMENT_OTHER): Payer: Self-pay

## 2022-08-27 ENCOUNTER — Inpatient Hospital Stay: Payer: Medicare Other | Attending: Oncology | Admitting: Oncology

## 2022-08-27 ENCOUNTER — Inpatient Hospital Stay: Payer: Medicare Other

## 2022-08-27 VITALS — BP 107/64 | HR 64 | Temp 98.1°F | Resp 18 | Ht 73.0 in | Wt 218.2 lb

## 2022-08-27 DIAGNOSIS — I4891 Unspecified atrial fibrillation: Secondary | ICD-10-CM | POA: Insufficient documentation

## 2022-08-27 DIAGNOSIS — D649 Anemia, unspecified: Secondary | ICD-10-CM | POA: Diagnosis not present

## 2022-08-27 DIAGNOSIS — D472 Monoclonal gammopathy: Secondary | ICD-10-CM | POA: Diagnosis not present

## 2022-08-27 DIAGNOSIS — N400 Enlarged prostate without lower urinary tract symptoms: Secondary | ICD-10-CM | POA: Diagnosis not present

## 2022-08-27 DIAGNOSIS — R1032 Left lower quadrant pain: Secondary | ICD-10-CM | POA: Insufficient documentation

## 2022-08-27 DIAGNOSIS — I2510 Atherosclerotic heart disease of native coronary artery without angina pectoris: Secondary | ICD-10-CM | POA: Insufficient documentation

## 2022-08-27 DIAGNOSIS — R768 Other specified abnormal immunological findings in serum: Secondary | ICD-10-CM | POA: Insufficient documentation

## 2022-08-27 DIAGNOSIS — E039 Hypothyroidism, unspecified: Secondary | ICD-10-CM

## 2022-08-27 DIAGNOSIS — Z8673 Personal history of transient ischemic attack (TIA), and cerebral infarction without residual deficits: Secondary | ICD-10-CM | POA: Insufficient documentation

## 2022-08-27 DIAGNOSIS — K5792 Diverticulitis of intestine, part unspecified, without perforation or abscess without bleeding: Secondary | ICD-10-CM | POA: Insufficient documentation

## 2022-08-27 DIAGNOSIS — Z952 Presence of prosthetic heart valve: Secondary | ICD-10-CM | POA: Insufficient documentation

## 2022-08-27 DIAGNOSIS — Z950 Presence of cardiac pacemaker: Secondary | ICD-10-CM | POA: Insufficient documentation

## 2022-08-27 LAB — CBC WITH DIFFERENTIAL (CANCER CENTER ONLY)
Abs Immature Granulocytes: 0.04 10*3/uL (ref 0.00–0.07)
Basophils Absolute: 0 10*3/uL (ref 0.0–0.1)
Basophils Relative: 0 %
Eosinophils Absolute: 0.2 10*3/uL (ref 0.0–0.5)
Eosinophils Relative: 2 %
HCT: 34.4 % — ABNORMAL LOW (ref 39.0–52.0)
Hemoglobin: 11.2 g/dL — ABNORMAL LOW (ref 13.0–17.0)
Immature Granulocytes: 0 %
Lymphocytes Relative: 8 %
Lymphs Abs: 0.8 10*3/uL (ref 0.7–4.0)
MCH: 28.1 pg (ref 26.0–34.0)
MCHC: 32.6 g/dL (ref 30.0–36.0)
MCV: 86.4 fL (ref 80.0–100.0)
Monocytes Absolute: 0.5 10*3/uL (ref 0.1–1.0)
Monocytes Relative: 5 %
Neutro Abs: 8.4 10*3/uL — ABNORMAL HIGH (ref 1.7–7.7)
Neutrophils Relative %: 85 %
Platelet Count: 216 10*3/uL (ref 150–400)
RBC: 3.98 MIL/uL — ABNORMAL LOW (ref 4.22–5.81)
RDW: 15.1 % (ref 11.5–15.5)
WBC Count: 9.9 10*3/uL (ref 4.0–10.5)
nRBC: 0 % (ref 0.0–0.2)

## 2022-08-27 LAB — CMP (CANCER CENTER ONLY)
ALT: 13 U/L (ref 0–44)
AST: 17 U/L (ref 15–41)
Albumin: 4.2 g/dL (ref 3.5–5.0)
Alkaline Phosphatase: 94 U/L (ref 38–126)
Anion gap: 10 (ref 5–15)
BUN: 13 mg/dL (ref 8–23)
CO2: 29 mmol/L (ref 22–32)
Calcium: 9.6 mg/dL (ref 8.9–10.3)
Chloride: 100 mmol/L (ref 98–111)
Creatinine: 1.1 mg/dL (ref 0.61–1.24)
GFR, Estimated: >60 mL/min (ref >60)
Glucose, Bld: 147 mg/dL — ABNORMAL HIGH (ref 70–99)
Potassium: 3.9 mmol/L (ref 3.5–5.1)
Sodium: 139 mmol/L (ref 135–145)
Total Bilirubin: 0.7 mg/dL (ref 0.3–1.2)
Total Protein: 7.8 g/dL (ref 6.5–8.1)

## 2022-08-27 LAB — TSH: TSH: 4.506 u[IU]/mL — ABNORMAL HIGH (ref 0.350–4.500)

## 2022-08-27 NOTE — Progress Notes (Signed)
  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  His chief complaint is arthritis pain.  He continues to have vision loss.  He reports intentional weight loss.  No difficulty with bowel function.  He has chronic left lower abdominal pain secondary to diverticulitis. He recently saw Dr. Nickola [name] to evaluate the arthritis.  He reports a laboratory evaluation is in process. Objective:  Vital signs in last 24 hours:  Blood pressure 107/64, pulse 64, temperature 98.1 F (36.7 C), temperature source Oral, resp. rate 18, height 6\' 1"  (1.854 m), weight 218 lb 3.2 oz (99 kg), SpO2 96 %.    Lymphatics: No cervical, supraclavicular, axillary, or inguinal nodes Resp: Lungs clear bilaterally Cardio: Regular rate and rhythm GI: No hepatosplenomegaly, tender in the left lower abdomen, no mass Vascular: No leg edema   Lab Results:  Lab Results  Component Value Date   WBC 9.9 08/27/2022   HGB 11.2 (L) 08/27/2022   HCT 34.4 (L) 08/27/2022   MCV 86.4 08/27/2022   PLT 216 08/27/2022   NEUTROABS 8.4 (H) 08/27/2022    CMP  Lab Results  Component Value Date   NA 139 08/27/2022   K 3.9 08/27/2022   CL 100 08/27/2022   CO2 29 08/27/2022   GLUCOSE 147 (H) 08/27/2022   BUN 13 08/27/2022   CREATININE 1.10 08/27/2022   CALCIUM 9.6 08/27/2022   PROT 7.8 08/27/2022   ALBUMIN 4.2 08/27/2022   AST 17 08/27/2022   ALT 13 08/27/2022   ALKPHOS 94 08/27/2022   BILITOT 0.7 08/27/2022   GFRNONAA >60 08/27/2022   GFRAA 92 11/26/2019     Medications: I have reviewed the patient's current medications.   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  We discussed the differential diagnosis including lymphoplasmacytic lymphoma, other low-grade lymphomas, and a monoclonal protein related to a collagen vascular disease.  We will follow-up on the IgM level from today.  He has chronic mild anemia.  The anemia could be related to the monoclonal protein, subclinical bleeding while on anticoagulation, or the anemia of chronic disease associated with inflammatory arthritis.  The hemoglobin has not changed significantly over the past several years.  There is no clinical evidence for progression to lymphoplasmacytic lymphoma or another lymphoproliferative disorder.  The plan is to continue observation.  He will return for an office and lab visit in 6 months.  Kevin Papas, MD  08/27/2022  12:42 PM

## 2022-08-29 LAB — IGM: IgM (Immunoglobulin M), Srm: 1374 mg/dL — ABNORMAL HIGH (ref 15–143)

## 2022-08-30 ENCOUNTER — Telehealth: Payer: Self-pay | Admitting: *Deleted

## 2022-08-30 NOTE — Telephone Encounter (Signed)
-----   Message from Ladene Artist, MD sent at 08/30/2022  9:25 AM EDT ----- Please call patient IgM is slightly higher, no other evidence for a progressive lymphoproliferative disorder, call for new symptoms, follow-up as scheduled

## 2022-08-30 NOTE — Telephone Encounter (Signed)
Per Dr.Sherrill, left message below on pt personal cell. Advised to call office for any concerns.

## 2022-09-01 ENCOUNTER — Other Ambulatory Visit (HOSPITAL_COMMUNITY): Payer: Self-pay

## 2022-09-01 ENCOUNTER — Other Ambulatory Visit (HOSPITAL_BASED_OUTPATIENT_CLINIC_OR_DEPARTMENT_OTHER): Payer: Self-pay

## 2022-09-01 LAB — PROTEIN ELECTROPHORESIS, SERUM
A/G Ratio: 0.9 (ref 0.7–1.7)
Albumin ELP: 3.5 g/dL (ref 2.9–4.4)
Alpha-1-Globulin: 0.3 g/dL (ref 0.0–0.4)
Alpha-2-Globulin: 0.8 g/dL (ref 0.4–1.0)
Beta Globulin: 0.8 g/dL (ref 0.7–1.3)
Gamma Globulin: 1.8 g/dL (ref 0.4–1.8)
Globulin, Total: 3.7 g/dL (ref 2.2–3.9)
M-Spike, %: 0.7 g/dL — ABNORMAL HIGH
Total Protein ELP: 7.2 g/dL (ref 6.0–8.5)

## 2022-09-02 ENCOUNTER — Other Ambulatory Visit: Payer: Self-pay | Admitting: Internal Medicine

## 2022-09-03 ENCOUNTER — Ambulatory Visit (INDEPENDENT_AMBULATORY_CARE_PROVIDER_SITE_OTHER): Payer: Medicare Other

## 2022-09-03 DIAGNOSIS — I495 Sick sinus syndrome: Secondary | ICD-10-CM

## 2022-09-03 LAB — CUP PACEART REMOTE DEVICE CHECK
Battery Remaining Longevity: 48 mo
Battery Remaining Percentage: 50 %
Brady Statistic RA Percent Paced: 0 %
Brady Statistic RV Percent Paced: 69 %
Date Time Interrogation Session: 20240705021200
Implantable Lead Connection Status: 753985
Implantable Lead Connection Status: 753985
Implantable Lead Implant Date: 20170417
Implantable Lead Implant Date: 20170417
Implantable Lead Location: 753859
Implantable Lead Location: 753860
Implantable Lead Model: 7740
Implantable Lead Model: 7741
Implantable Lead Serial Number: 662696
Implantable Lead Serial Number: 751382
Implantable Pulse Generator Implant Date: 20170417
Lead Channel Impedance Value: 691 Ohm
Lead Channel Impedance Value: 732 Ohm
Lead Channel Setting Pacing Amplitude: 2.5 V
Lead Channel Setting Pacing Amplitude: 2.5 V
Lead Channel Setting Pacing Pulse Width: 0.4 ms
Lead Channel Setting Sensing Sensitivity: 2.5 mV
Pulse Gen Serial Number: 718418
Zone Setting Status: 755011

## 2022-09-03 NOTE — Telephone Encounter (Signed)
Patient is requesting a refill of the following medications: Requested Prescriptions   Pending Prescriptions Disp Refills   butalbital-acetaminophen-caffeine (FIORICET) 50-325-40 MG tablet 45 tablet 5    Sig: Take 1 tablet by mouth twice daily as needed for headache.    Date of last refill: 02/16/2022  Refill amount: 5  Treatment agreement date: Unknown

## 2022-09-06 ENCOUNTER — Other Ambulatory Visit (HOSPITAL_BASED_OUTPATIENT_CLINIC_OR_DEPARTMENT_OTHER): Payer: Self-pay

## 2022-09-06 ENCOUNTER — Other Ambulatory Visit: Payer: Self-pay | Admitting: Internal Medicine

## 2022-09-06 MED ORDER — HYDROCODONE-ACETAMINOPHEN 7.5-325 MG PO TABS
1.0000 | ORAL_TABLET | Freq: Three times a day (TID) | ORAL | 0 refills | Status: DC
  Filled 2022-09-06: qty 90, 30d supply, fill #0

## 2022-09-06 MED ORDER — BUTALBITAL-APAP-CAFFEINE 50-325-40 MG PO TABS
1.0000 | ORAL_TABLET | Freq: Two times a day (BID) | ORAL | 5 refills | Status: DC | PRN
  Filled 2022-09-06: qty 45, 23d supply, fill #0

## 2022-09-07 ENCOUNTER — Other Ambulatory Visit: Payer: Self-pay

## 2022-09-07 ENCOUNTER — Other Ambulatory Visit (HOSPITAL_BASED_OUTPATIENT_CLINIC_OR_DEPARTMENT_OTHER): Payer: Self-pay

## 2022-09-09 DIAGNOSIS — M1991 Primary osteoarthritis, unspecified site: Secondary | ICD-10-CM | POA: Diagnosis not present

## 2022-09-09 DIAGNOSIS — R5383 Other fatigue: Secondary | ICD-10-CM | POA: Diagnosis not present

## 2022-09-09 DIAGNOSIS — Z6827 Body mass index (BMI) 27.0-27.9, adult: Secondary | ICD-10-CM | POA: Diagnosis not present

## 2022-09-09 DIAGNOSIS — M542 Cervicalgia: Secondary | ICD-10-CM | POA: Diagnosis not present

## 2022-09-09 DIAGNOSIS — G894 Chronic pain syndrome: Secondary | ICD-10-CM | POA: Diagnosis not present

## 2022-09-09 DIAGNOSIS — E663 Overweight: Secondary | ICD-10-CM | POA: Diagnosis not present

## 2022-09-15 DIAGNOSIS — L905 Scar conditions and fibrosis of skin: Secondary | ICD-10-CM | POA: Diagnosis not present

## 2022-09-15 DIAGNOSIS — L820 Inflamed seborrheic keratosis: Secondary | ICD-10-CM | POA: Diagnosis not present

## 2022-09-15 DIAGNOSIS — Z85820 Personal history of malignant melanoma of skin: Secondary | ICD-10-CM | POA: Diagnosis not present

## 2022-09-21 ENCOUNTER — Non-Acute Institutional Stay: Payer: Medicare Other | Admitting: Internal Medicine

## 2022-09-21 ENCOUNTER — Encounter: Payer: Self-pay | Admitting: Internal Medicine

## 2022-09-21 VITALS — BP 136/88 | HR 70 | Temp 98.2°F | Resp 17 | Ht 73.0 in | Wt 210.0 lb

## 2022-09-21 DIAGNOSIS — H34231 Retinal artery branch occlusion, right eye: Secondary | ICD-10-CM | POA: Diagnosis not present

## 2022-09-21 DIAGNOSIS — K219 Gastro-esophageal reflux disease without esophagitis: Secondary | ICD-10-CM | POA: Diagnosis not present

## 2022-09-21 DIAGNOSIS — I10 Essential (primary) hypertension: Secondary | ICD-10-CM

## 2022-09-21 DIAGNOSIS — E1159 Type 2 diabetes mellitus with other circulatory complications: Secondary | ICD-10-CM

## 2022-09-21 DIAGNOSIS — I25118 Atherosclerotic heart disease of native coronary artery with other forms of angina pectoris: Secondary | ICD-10-CM | POA: Diagnosis not present

## 2022-09-21 DIAGNOSIS — N401 Enlarged prostate with lower urinary tract symptoms: Secondary | ICD-10-CM | POA: Diagnosis not present

## 2022-09-21 DIAGNOSIS — E785 Hyperlipidemia, unspecified: Secondary | ICD-10-CM

## 2022-09-21 DIAGNOSIS — G894 Chronic pain syndrome: Secondary | ICD-10-CM | POA: Diagnosis not present

## 2022-09-21 DIAGNOSIS — D472 Monoclonal gammopathy: Secondary | ICD-10-CM | POA: Diagnosis not present

## 2022-09-21 DIAGNOSIS — M158 Other polyosteoarthritis: Secondary | ICD-10-CM

## 2022-09-21 DIAGNOSIS — R600 Localized edema: Secondary | ICD-10-CM | POA: Diagnosis not present

## 2022-09-21 DIAGNOSIS — K5903 Drug induced constipation: Secondary | ICD-10-CM | POA: Diagnosis not present

## 2022-09-21 DIAGNOSIS — I4819 Other persistent atrial fibrillation: Secondary | ICD-10-CM

## 2022-09-21 NOTE — Progress Notes (Signed)
Remote pacemaker transmission.   

## 2022-09-21 NOTE — Progress Notes (Signed)
Location:  [facility] features editor of Service:  Clinic (12)  Provider:   Code Status:  Goals of Care:     07/06/2022    6:58 AM  Advanced Directives  Does Patient Have a Medical Advance Directive? Yes  Type of Estate agent of [place];Living will     Chief Complaint  Patient presents with   Medical Management of Chronic Issues    4 month follow up     HPI: Patient is a 88 y.o. male seen today for medical management of chronic diseases.   Lives in [place] with his Wife Now has Personal nurse who helps with meds She is not with him today  Diabetes Prednisone use and Diet Needs follow up of his A1C Diet controlled  Hypertension  Fluctuates Is not taking any norvasc and reduced his Toprol also to 50 mg  Arthritis He is stable on Prednisone and Norco Seeing Dr Judith Blonder  Per her note he does not have Inflammatory Arthritis and management is only pain control He is thinking of seeing Dr Vear Clock Does not  need to stay on Prednisone per her but per Endocrinology He feels it helps him with his pain  Abdominal pain Not taking Movantik Using Miralax   Also Has Augmentin in case for Diverticulitis   Chronic issues Has h/o CAD, s/p Stent  Recent Repeat Cath showed no change    H/o Atrial Flutter on Eliquis Due to high runs of A Flutter Digoxin was added  Sinus Node Dysfunction s/p PPM   Orthostatic Hypotension  Adrenal Insufficiency per Dr Lafe Garin due to Prolong Steroid use for Osteoarthritis  on Chronic Prednisone   MGUS Follows with Dr Truett Perna for  Labs BPH  Chronic Fatigue and Weakness Underwent R L4-5 hemilaminectomy with diskectomy on 12/26 for severe back pain radiating to right lower extremity Chronic Dizziness   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 Follows with Dr Pearlean Brownie Annually carotid ultrasound which did not show any significant stenosis 2D echo which showed a EF of 60% with inferior wall hypokinesis   Headache Wants  to take Fioricet every day almost H/o Skin cancers s/p Mohrs surgery for Melanoma   Past Medical History:  Diagnosis Date   Aortic stenosis    moderate aortic stenosis   Arthritis    Benign prostatic hypertrophy    Chronic systolic heart failure (HCC)    Chronotropic incompetence with sinus node dysfunction    Status post Guidant dual-mode, dual-pacing, dual-sensing  pacemaker   implantation now programmed to AAI with recent generator change.   Coronary artery disease    status post multiple prior percutaneous coronary interventions, microvascular angina per Dr [name]   Diverticulitis sigmoid colon recurrent    Dysfunctional autonomic nervous system    Dyspnea    Heart murmur    History of primary hypertension    Hypercoagulable state (HCC)    chronically anticoagulated with coumadin   Hyperlipidemia    Hyperthyroidism    Hypothyroidism    Dr. [name]   MGUS (monoclonal gammopathy of unknown significance) 02/17/2013   Ocular myasthenia (HCC)    Osteoarthritis    Paroxysmal atrial fibrillation (HCC)    DR. Riley Kill,    Prediabetes 09/21/2017   Stroke (HCC)    1990    Past Surgical History:  Procedure Laterality Date   AORTIC VALVE REPLACEMENT  03/15/2011   Procedure: AORTIC VALVE REPLACEMENT (AVR);  Surgeon: Alleen Borne, MD;  Location: [facility] OR;  Service: Open Heart Surgery;  Laterality: N/A;   APPENDECTOMY     CARDIAC CATHETERIZATION     13   CARDIOVERSION     CARDIOVERSION  04/15/2011   Procedure: CARDIOVERSION;  Surgeon: Marca Ancona, MD;  Location: [facility] ENDOSCOPY;  Service: Cardiovascular;  Laterality: N/A;   CARDIOVERSION N/A 09/11/2014   Procedure: CARDIOVERSION;  Surgeon: Quintella Reichert, MD;  Location: [facility] ENDOSCOPY;  Service: Cardiovascular;  Laterality: N/A;   CARDIOVERSION N/A 06/27/2015   Procedure: CARDIOVERSION;  Surgeon: Vesta Mixer, MD;  Location: [facility] ENDOSCOPY;  Service: Cardiovascular;  Laterality: N/A;   CARDIOVERSION N/A 07/04/2015   Procedure:  CARDIOVERSION;  Surgeon: Marinus Maw, MD;  Location: [facility] ENDOSCOPY;  Service: Cardiovascular;  Laterality: N/A;   CARDIOVERSION N/A 04/13/2017   Procedure: CARDIOVERSION;  Surgeon: Quintella Reichert, MD;  Location: [facility] ENDOSCOPY;  Service: Cardiovascular;  Laterality: N/A;   COLONOSCOPY     CORONARY STENT INTERVENTION N/A 10/15/2019   Procedure: CORONARY STENT INTERVENTION;  Surgeon: Corky Crafts, MD;  Location: [facility] INVASIVE CV LAB;  Service: Cardiovascular;  Laterality: N/A;   EP IMPLANTABLE DEVICE N/A 06/16/2015   Procedure: Pacemaker Implant;  Surgeon: Marinus Maw, MD;  Location: [facility] INVASIVE CV LAB;  Service: Cardiovascular;  Laterality: N/A;   hemrrhoidectomy     LEFT AND RIGHT HEART CATHETERIZATION WITH CORONARY ANGIOGRAM Bilateral 02/01/2011   Procedure: LEFT AND RIGHT HEART CATHETERIZATION WITH CORONARY ANGIOGRAM;  Surgeon: Herby Abraham, MD;  Location: [facility] CATH LAB;  Service: Cardiovascular;  Laterality: Bilateral;   LEFT HEART CATH AND CORONARY ANGIOGRAPHY N/A 10/15/2019   Procedure: LEFT HEART CATH AND CORONARY ANGIOGRAPHY;  Surgeon: Corky Crafts, MD;  Location: [facility] INVASIVE CV LAB;  Service: Cardiovascular;  Laterality: N/A;   LEFT HEART CATH AND CORONARY ANGIOGRAPHY N/A 10/07/2020   Procedure: LEFT HEART CATH AND CORONARY ANGIOGRAPHY;  Surgeon: Tonny Bollman, MD;  Location: [facility] INVASIVE CV LAB;  Service: Cardiovascular;  Laterality: N/A;   LEFT HEART CATH AND CORONARY ANGIOGRAPHY N/A 07/06/2022   Procedure: LEFT HEART CATH AND CORONARY ANGIOGRAPHY;  Surgeon: Tonny Bollman, MD;  Location: [facility] INVASIVE CV LAB;  Service: Cardiovascular;  Laterality: N/A;   LUMBAR LAMINECTOMY/DECOMPRESSION MICRODISCECTOMY Right 02/23/2021   Procedure: Right Lumbar four-five microdiscectomy;  Surgeon: Tia Alert, MD;  Location: [facility] OR;  Service: Neurosurgery;  Laterality: Right;   MAZE  03/15/2011   Procedure: MAZE;  Surgeon: Alleen Borne, MD;  Location: [facility] OR;  Service: Open Heart  Surgery;  Laterality: N/A;   MOHS SURGERY  09/2021   PACEMAKER INSERTION  1991   Guidant PPM, most recent Generator Change by Dr [name] was 08/22/06   RIGHT/LEFT HEART CATH AND CORONARY ANGIOGRAPHY N/A 07/06/2016   Procedure: Right/Left Heart Cath and Coronary Angiography;  Surgeon: Tonny Bollman, MD;  Location: [facility] INVASIVE CV LAB;  Service: Cardiovascular;  Laterality: N/A;   TEE WITHOUT CARDIOVERSION  04/15/2011   Procedure: TRANSESOPHAGEAL ECHOCARDIOGRAM (TEE);  Surgeon: Marca Ancona, MD;  Location: [facility] ENDOSCOPY;  Service: Cardiovascular;  Laterality: N/A;   TEE WITHOUT CARDIOVERSION N/A 09/11/2014   Procedure: TRANSESOPHAGEAL ECHOCARDIOGRAM (TEE);  Surgeon: Quintella Reichert, MD;  Location: [facility] ENDOSCOPY;  Service: Cardiovascular;  Laterality: N/A;    Allergies  Allergen Reactions   Contrast Media [Iodinated Contrast Media] Hives   Gadolinium Derivatives Hives   Iodine-131 Hives   Metrizamide Hives   Tetanus Toxoids     Redness     Outpatient Encounter Medications as of 09/21/2022  Medication Sig   amoxicillin (AMOXIL) 500 MG capsule Take  4 capsules by mouth 1 hour prior to dental appointment.   amoxicillin-clavulanate (AUGMENTIN) 875-125 MG tablet Take 1 tablet by mouth 2 (two) times daily. (Patient taking differently: Take 1 tablet by mouth 2 (two) times daily as needed (Diverticulitis Flare).)   apixaban (ELIQUIS) 5 MG TABS tablet Take 1 tablet (5 mg total) by mouth 2 (two) times daily.   butalbital-acetaminophen-caffeine (FIORICET) 50-325-40 MG tablet Take 1 tablet by mouth 2 (two) times daily as needed for headache.   cholecalciferol (VITAMIN D3) 25 MCG (1000 UNIT) tablet Take 1,000 Units by mouth daily.   cilostazol (PLETAL) 100 MG tablet Take 1 tablet (100 mg total) by mouth 2 (two) times daily.   Coenzyme Q10 200 MG TABS Take 200 mg by mouth daily.   desoximetasone (TOPICORT) 0.25 % cream Apply 1 Application topically 2 (two) times daily. (Patient taking differently: Apply 1  Application topically 2 (two) times daily as needed (dermatitis).)   diclofenac Sodium (VOLTAREN) 1 % GEL Apply 2 g topically 2 (two) times daily as needed (knee pain).   digoxin (LANOXIN) 0.125 MG tablet Take 1 tablet (0.125 mg total) by mouth daily.   doxazosin (CARDURA) 4 MG tablet 1 tablet PO QD (Patient taking differently: Take 4 mg by mouth at bedtime.)   ezetimibe (ZETIA) 10 MG tablet Take 1 tablet (10 mg total) by mouth daily.   furosemide (LASIX) 20 MG tablet Take 20 mg by mouth daily as needed for fluid.   HYDROcodone-acetaminophen (NORCO) 7.5-325 MG tablet Take 1 tablet by mouth every 8 (eight) hours as needed for pain   lidocaine (SALONPAS PAIN RELIEVING) 4 % Place 2 patches onto the skin daily as needed (pain).   LORazepam (ATIVAN) 1 MG tablet Take 1 tablet (1 mg) by mouth at bedtime, and 0.5 tablet (0.5 mg) daily as needed for anxiety.   magnesium oxide (MAG-OX) 400 MG tablet Take 1 tablet (400 mg total) by mouth daily.   Menthol-Methyl Salicylate (SALONPAS PAIN RELIEF PATCH EX) Apply 2 patches topically daily as needed (pain).   metoprolol succinate (TOPROL-XL) 50 MG 24 hr tablet Take 50 mg by mouth 2 (two) times daily. Take with or immediately following a meal.   midodrine (PROAMATINE) 2.5 MG tablet Take 1 tablet (2.5 mg total) by mouth 2 (two) times daily with a meal. (Patient taking differently: Take 2.5 mg by mouth 2 (two) times daily as needed (hypotension).)   Multiple Vitamins-Minerals (MULTIVITAMIN WITH MINERALS) tablet Take 1 tablet by mouth daily.   naloxegol oxalate (MOVANTIK) 12.5 MG TABS tablet Take 12.5 mg by mouth at bedtime.   nitroGLYCERIN (NITROSTAT) 0.4 MG SL tablet PLACE 1 TABLET UNDER THE TONGUE EVERY 5 MINUTES AS NEEDED FOR CHEST PAIN.   Omeprazole-Sodium Bicarbonate (ZEGERID PO) Take 1 capsule by mouth daily as needed (acid reflux).   pantoprazole (PROTONIX) 40 MG tablet Take 1 tablet (40 mg total) by mouth 2 (two) times daily. (Patient taking differently: Take  40 mg by mouth daily.)   Polyethyl Glycol-Propyl Glycol (SYSTANE OP) Place 1 drop into both eyes daily as needed (dry eyes).   polyethylene glycol (MIRALAX / GLYCOLAX) 17 g packet Take 17 g by mouth every other day.   potassium chloride SA (KLOR-CON M) 20 MEQ tablet TAKE 1 TABLET BY MOUTH DAILY.   predniSONE (DELTASONE) 5 MG tablet Take 1 tablet (5 mg total) by mouth daily with breakfast.   predniSONE (DELTASONE) 50 MG tablet Take 1 tablet by mouth 13 hours prior, 7 hours prior, and 1 hour prior  to procedure   rosuvastatin (CRESTOR) 20 MG tablet Take 1 tablet (20 mg total) by mouth at bedtime. Please keep scheduled appointment for future refills.   sennosides-docusate sodium (SENOKOT-S) 8.6-50 MG tablet Take 1 tablet by mouth 2 (two) times daily.   amLODipine (NORVASC) 2.5 MG tablet Take 0.5 tablets (1.25 mg total) by mouth daily. (Patient not taking: Reported on 08/27/2022)   diphenhydrAMINE (BENADRYL) 25 MG tablet Take 2 tablets (50 mg total) by mouth one hour prior to procedure (Patient not taking: Reported on 09/21/2022)   metoprolol succinate (TOPROL XL) 25 MG 24 hr tablet Take 3 tablets (75 mg total) by mouth 2 (two) times daily. (Patient not taking: Reported on 09/21/2022)   [DISCONTINUED] predniSONE (DELTASONE) 10 MG tablet Take 4 mg by mouth daily with breakfast. (Patient not taking: Reported on 08/27/2022)   [DISCONTINUED] pregabalin (LYRICA) 75 MG capsule Take 75 mg by mouth 2 (two) times daily.   No facility-administered encounter medications on file as of 09/21/2022.    Review of Systems:  Review of Systems  Constitutional:  Negative for activity change, appetite change and unexpected weight change.  HENT: Negative.    Respiratory:  Negative for cough and shortness of breath.   Cardiovascular:  Positive for leg swelling.  Gastrointestinal:  Negative for constipation.  Genitourinary:  Positive for frequency.  Musculoskeletal:  Positive for arthralgias and myalgias. Negative for gait  problem.  Skin: Negative.  Negative for rash.  Neurological:  Negative for dizziness and weakness.  Psychiatric/Behavioral:  Negative for confusion and sleep disturbance.   All other systems reviewed and are negative.   Health Maintenance  Topic Date Due   Medicare Annual Wellness (AWV)  Never done   Zoster Vaccines- Shingrix (1 of 2) Never done   Pneumonia Vaccine 38+ Years old (1 of 1 - PCV) Never done   DTaP/Tdap/Td (2 - Td or Tdap) 10/24/2014   COVID-19 Vaccine (4 - 2023-24 season) 10/30/2021   INFLUENZA VACCINE  09/30/2022   HPV VACCINES  Aged Out    Physical Exam: Vitals:   09/21/22 0924  BP: 136/88  Pulse: 70  Resp: 17  Temp: 98.2 F (36.8 C)  TempSrc: Temporal  SpO2: 95%  Weight: 210 lb (95.3 kg)  Height: 6\' 1"  (1.854 m)   Body mass index is 27.71 kg/m. Physical Exam Vitals reviewed.  Constitutional:      Appearance: Normal appearance.  HENT:     Head: Normocephalic.     Nose: Nose normal.     Mouth/Throat:     Mouth: Mucous membranes are moist.     Pharynx: Oropharynx is clear.  Eyes:     Pupils: Pupils are equal, round, and reactive to light.  Cardiovascular:     Rate and Rhythm: Normal rate. Rhythm irregular.     Pulses: Normal pulses.     Heart sounds: No murmur heard. Pulmonary:     Effort: Pulmonary effort is normal. No respiratory distress.     Breath sounds: Normal breath sounds. No rales.  Abdominal:     General: Abdomen is flat. Bowel sounds are normal.     Palpations: Abdomen is soft.  Musculoskeletal:        General: No swelling.     Cervical back: Neck supple.  Skin:    General: Skin is warm.  Neurological:     General: No focal deficit present.     Mental Status: He is alert and oriented to person, place, and time.  Psychiatric:  Mood and Affect: Mood normal.        Thought Content: Thought content normal.     Labs reviewed: Basic Metabolic Panel: Recent Labs    02/18/22 1432 06/29/22 1701 08/27/22 1120  NA 138  143 139  K 4.3 4.7 3.9  CL 102 101 100  CO2 27 23 29   GLUCOSE 191* 119* 147*  BUN 15 11 13   CREATININE 1.05 0.92 1.10  CALCIUM 9.2 9.7 9.6  TSH  --   --  4.506*   Liver Function Tests: Recent Labs    02/18/22 1432 08/27/22 1120  AST 15 17  ALT 11 13  ALKPHOS 78 94  BILITOT 0.6 0.7  PROT 7.0 7.8  ALBUMIN 4.2 4.2   No results for input(s): "LIPASE", "AMYLASE" in the last 8760 hours. No results for input(s): "AMMONIA" in the last 8760 hours. CBC: Recent Labs    02/18/22 1432 05/28/22 1305 06/29/22 1701 08/27/22 1120  WBC 8.9 7.7 8.2 9.9  NEUTROABS 7.1 6.2  --  8.4*  HGB 10.4* 11.1* 11.3* 11.2*  HCT 32.7* 35.4* 35.3* 34.4*  MCV 86.5 86.6 86 86.4  PLT 203 217 243 216   Lipid Panel: No results for input(s): "CHOL", "HDL", "LDLCALC", "TRIG", "CHOLHDL", "LDLDIRECT" in the last 8760 hours. Lab Results  Component Value Date   HGBA1C 7.0 (H) 05/28/2022    Procedures since last visit: CUP PACEART REMOTE DEVICE CHECK  Result Date: 09/03/2022 Scheduled remote reviewed. Normal device function.  Known permanent AF, programmed DDIR, good ventricular rate control, on OAC according to previous report Next remote 91 days. Hassell Halim, RN, CCDS, [employer]   Assessment/Plan 1. Other osteoarthritis involving multiple joints Continue Norco per [facility] If patient does go to Dr Vear Clock he has to take over his Prescription On Low dose Prednisone but per Dr Nickola Major he does not need it for arthritis  2. Essential hypertension Fluctuates Off Norvasc now only on Toprol  3. Chronic pain syndrome On Chronic Norco  4. Type 2 diabetes mellitus with other circulatory complication, without long-term current use of insulin (HCC) Diet controlled Repeat A1C before next visit  5. MGUS (monoclonal gammopathy of unknown significance) Follows with Dr Truett Perna He is stable  6. Coronary artery disease of native artery of native heart with stable angina pectoris (HCC) Dr  Baxter Flattery Cath On Statin  7. Hyperlipidemia, unspecified hyperlipidemia type statin  8. Retinal artery branch occlusion of right eye Pletal  9. Drug-induced constipation Miralax  10. Benign prostatic hyperplasia with lower urinary tract symptoms, symptom details unspecified [facility] recommended I and O cath He refused due to risk of bleeding on Eliquis  11. Bilateral leg edema Lasix PRN  12. Gastroesophageal reflux disease without esophagitis Protonix  13. Persistent atrial fibrillation (HCC) Eliquis,Digoxin andToprol 14 Anxiety Lorazepam per PS 15Headache Fioricet   Labs/tests ordered:  A1C,Lipid Panel and TSH before next appointment Next appt:  02/01/2023

## 2022-09-28 ENCOUNTER — Other Ambulatory Visit (HOSPITAL_BASED_OUTPATIENT_CLINIC_OR_DEPARTMENT_OTHER): Payer: Self-pay

## 2022-09-28 DIAGNOSIS — Z79891 Long term (current) use of opiate analgesic: Secondary | ICD-10-CM | POA: Diagnosis not present

## 2022-09-28 DIAGNOSIS — M47812 Spondylosis without myelopathy or radiculopathy, cervical region: Secondary | ICD-10-CM | POA: Diagnosis not present

## 2022-09-28 DIAGNOSIS — G894 Chronic pain syndrome: Secondary | ICD-10-CM | POA: Diagnosis not present

## 2022-09-28 DIAGNOSIS — M48062 Spinal stenosis, lumbar region with neurogenic claudication: Secondary | ICD-10-CM | POA: Diagnosis not present

## 2022-09-28 MED ORDER — HYDROCODONE-ACETAMINOPHEN 10-325 MG PO TABS
1.0000 | ORAL_TABLET | Freq: Two times a day (BID) | ORAL | 0 refills | Status: DC
  Filled 2022-09-28: qty 60, 30d supply, fill #0

## 2022-09-28 MED ORDER — BUTALBITAL-APAP-CAFFEINE 50-325-40 MG PO TABS
1.0000 | ORAL_TABLET | Freq: Two times a day (BID) | ORAL | 0 refills | Status: DC | PRN
  Filled 2022-09-28: qty 60, 30d supply, fill #0

## 2022-09-30 ENCOUNTER — Encounter: Payer: Self-pay | Admitting: Gastroenterology

## 2022-09-30 ENCOUNTER — Encounter (HOSPITAL_COMMUNITY): Payer: Self-pay

## 2022-09-30 ENCOUNTER — Other Ambulatory Visit (INDEPENDENT_AMBULATORY_CARE_PROVIDER_SITE_OTHER): Payer: Medicare Other

## 2022-09-30 ENCOUNTER — Ambulatory Visit (INDEPENDENT_AMBULATORY_CARE_PROVIDER_SITE_OTHER): Payer: Medicare Other | Admitting: Gastroenterology

## 2022-09-30 ENCOUNTER — Other Ambulatory Visit: Payer: Self-pay | Admitting: Gastroenterology

## 2022-09-30 ENCOUNTER — Ambulatory Visit (HOSPITAL_COMMUNITY)
Admission: RE | Admit: 2022-09-30 | Discharge: 2022-09-30 | Disposition: A | Discharge status: Home / Self Care | Payer: Medicare Other | Source: Ambulatory Visit | Attending: Gastroenterology | Admitting: Gastroenterology

## 2022-09-30 ENCOUNTER — Other Ambulatory Visit (HOSPITAL_BASED_OUTPATIENT_CLINIC_OR_DEPARTMENT_OTHER): Payer: Self-pay

## 2022-09-30 VITALS — BP 110/62 | HR 68 | Ht 73.0 in | Wt 211.2 lb

## 2022-09-30 DIAGNOSIS — N3289 Other specified disorders of bladder: Secondary | ICD-10-CM | POA: Diagnosis not present

## 2022-09-30 DIAGNOSIS — R1032 Left lower quadrant pain: Secondary | ICD-10-CM | POA: Diagnosis not present

## 2022-09-30 DIAGNOSIS — K5732 Diverticulitis of large intestine without perforation or abscess without bleeding: Secondary | ICD-10-CM

## 2022-09-30 DIAGNOSIS — K5730 Diverticulosis of large intestine without perforation or abscess without bleeding: Secondary | ICD-10-CM | POA: Diagnosis not present

## 2022-09-30 DIAGNOSIS — K449 Diaphragmatic hernia without obstruction or gangrene: Secondary | ICD-10-CM | POA: Diagnosis not present

## 2022-09-30 DIAGNOSIS — G8929 Other chronic pain: Secondary | ICD-10-CM

## 2022-09-30 DIAGNOSIS — K8020 Calculus of gallbladder without cholecystitis without obstruction: Secondary | ICD-10-CM | POA: Diagnosis not present

## 2022-09-30 LAB — BASIC METABOLIC PANEL
BUN: 18 mg/dL (ref 6–23)
CO2: 31 mEq/L (ref 19–32)
Calcium: 9.5 mg/dL (ref 8.4–10.5)
Chloride: 100 mEq/L (ref 96–112)
Creatinine, Ser: 1.09 mg/dL (ref 0.40–1.50)
GFR: 61.64 mL/min (ref >60.00)
Glucose, Bld: 154 mg/dL — ABNORMAL HIGH (ref 70–99)
Potassium: 4.2 mEq/L (ref 3.5–5.1)
Sodium: 138 mEq/L (ref 135–145)

## 2022-09-30 MED ORDER — AMOXICILLIN-POT CLAVULANATE 875-125 MG PO TABS
1.0000 | ORAL_TABLET | Freq: Two times a day (BID) | ORAL | 1 refills | Status: DC
  Filled 2022-09-30: qty 14, 7d supply, fill #0
  Filled 2022-10-02 – 2022-10-06 (×2): qty 14, 7d supply, fill #1

## 2022-09-30 NOTE — Patient Instructions (Signed)
Please go on over to [facility], Check in at admitting now for your CT Abdomen/Pelvis No more eating or drinking until after your CT scan   We have sent the following medications to your pharmacy for you to pick up at your convenience: DO NOT TAKE Augmentin until after your CT results  Augmentin  Use Citrucel 1 tablespoon twice a day goal is to achieve 1-2 soft bowel movements a day  Your provider has requested that you go to the basement level for lab work before leaving today. Press "B" on the elevator. The lab is located at the first door on the left as you exit the elevator.   Due to recent changes in healthcare laws, you may see the results of your imaging and laboratory studies on MyChart before your provider has had a chance to review them.  We understand that in some cases there may be results that are confusing or concerning to you. Not all laboratory results come back in the same time frame and the provider may be waiting for multiple results in order to interpret others.  Please give [place] 48 hours in order for your provider to thoroughly review all the results before contacting the office for clarification of your results.    I appreciate the  opportunity to care for you  Thank You   Marsa Aris , MD

## 2022-09-30 NOTE — Progress Notes (Signed)
Kevin Kass, MD    [id number]    22-Jan-1935  Primary Care Physician:Gupta, Freddie Breech, MD  Referring Physician: Mahlon Gammon, MD [address] [place],  [place] [address]   Chief complaint:  Chief Complaint  Patient presents with   Abdominal Pain    Patient states he is having abdominal discomfort. Patient is thinking diverticulosis or gastric outlet .    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 He is a patient of Dr. Leone Payor.   He was last seen on 11-04-21 by Dr. Leone Payor.  Today, he complains of abdominal pain that has been persistent for several years. He also complains of minimal lower abdominal tenderness. He also complains of hiccups and excessive burping that tends to go on for a while then would resolve on its own.   He would take amoxicillin to relief his pain and symptoms. He last took them about 6 months ago.  He reports typically having a BM every other day and states that he take's Miralax daily.  He reports losing about 25-30 pounds in the last few years, not necessarily intentionally. He tends to feel full after eating small meals and tends to have decreased appetite. He would also experience intermittent nausea and food regurgitation. He's been on codeine for several years now for severe arthritis.   Patient denies diarrhea, blood in stool, black stool, vomiting, unintentional weight loss, reflux, dysphagia.  GI Hx:  CT Abdomen Pelvis w contrast 08-12-21  1. No acute CT findings of the abdomen or pelvis to explain lower abdominal pain. 2. Descending and sigmoid diverticulosis without evidence of acute diverticulitis. 3. Severe prostatomegaly. 4. Tiny gallstones and or sludge in the gallbladder. No evidence of acute cholecystitis. 5. Coronary artery disease.  CT Abdomen Pelvis wo contrast 10-18-19 -Mild to moderate acute sigmoid  diverticulitis. No evidence of abscess or other complication. -Stable enlarged prostate and findings of chronic bladder outlet obstruction.  -Cholelithiasis. No radiographic evidence of cholecystitis.  DG Abd 1 view 10-14-19 Moderate stool in the right colon, small volume of stool distally. No abnormal rectal distention. Normal bowel gas pattern without obstruction.    Current Outpatient Medications:    amLODipine (NORVASC) 2.5 MG tablet, Take 0.5 tablets (1.25 mg total) by mouth daily., Disp: 45 tablet, Rfl: 3   amoxicillin (AMOXIL) 500 MG capsule, Take 4 capsules by mouth 1 hour prior to dental appointment., Disp: 12 capsule, Rfl: 12   amoxicillin-clavulanate (AUGMENTIN) 875-125 MG tablet, Take 1 tablet by mouth 2 (two) times daily. (Patient taking differently: Take 1 tablet by mouth 2 (two) times daily as needed (Diverticulitis Flare).), Disp: 20 tablet, Rfl: 0   apixaban (ELIQUIS) 5 MG TABS tablet, Take 1 tablet (5 mg total) by mouth 2 (two) times daily., Disp: 180 tablet, Rfl: 1   butalbital-acetaminophen-caffeine (BAC) 50-325-40 MG tablet, Take 1 tablet by mouth 2 (two) times daily as needed., Disp: 60 tablet, Rfl: 0   butalbital-acetaminophen-caffeine (FIORICET) 50-325-40 MG tablet, Take 1 tablet by mouth 2 (two) times daily as needed for headache., Disp: 45 tablet, Rfl: 5   cholecalciferol (VITAMIN D3) 25 MCG (1000 UNIT) tablet, Take 1,000 Units by mouth daily., Disp: , Rfl:    cilostazol (PLETAL) 100 MG tablet, Take 1 tablet (100 mg total) by mouth 2 (two) times daily., Disp: 180 tablet, Rfl: 3   Coenzyme Q10  200 MG TABS, Take 200 mg by mouth daily., Disp: , Rfl:    desoximetasone (TOPICORT) 0.25 % cream, Apply 1 Application topically 2 (two) times daily. (Patient taking differently: Apply 1 Application topically 2 (two) times daily as needed (dermatitis).), Disp: 60 g, Rfl: 0   diclofenac Sodium (VOLTAREN) 1 % GEL, Apply 2 g topically 2 (two) times daily as needed (knee pain)., Disp:  , Rfl:    digoxin (LANOXIN) 0.125 MG tablet, Take 1 tablet (0.125 mg total) by mouth daily., Disp: 90 tablet, Rfl: 3   doxazosin (CARDURA) 4 MG tablet, 1 tablet PO QD (Patient taking differently: Take 4 mg by mouth at bedtime.), Disp: 90 tablet, Rfl: 3   ezetimibe (ZETIA) 10 MG tablet, Take 1 tablet (10 mg total) by mouth daily., Disp: 90 tablet, Rfl: 3   furosemide (LASIX) 20 MG tablet, Take 20 mg by mouth daily as needed for fluid., Disp: , Rfl:    HYDROcodone-acetaminophen (NORCO) 10-325 MG tablet, Take 1 tablet by mouth 2 (two) times daily as directed., Disp: 90 tablet, Rfl: 0   HYDROcodone-acetaminophen (NORCO) 7.5-325 MG tablet, Take 1 tablet by mouth every 8 (eight) hours as needed for pain, Disp: 90 tablet, Rfl: 0   lidocaine (SALONPAS PAIN RELIEVING) 4 %, Place 2 patches onto the skin daily as needed (pain)., Disp: , Rfl:    LORazepam (ATIVAN) 1 MG tablet, Take 1 tablet (1 mg) by mouth at bedtime, and 0.5 tablet (0.5 mg) daily as needed for anxiety., Disp: 60 tablet, Rfl: 1   magnesium oxide (MAG-OX) 400 MG tablet, Take 1 tablet (400 mg total) by mouth daily., Disp: 120 tablet, Rfl: 1   Menthol-Methyl Salicylate (SALONPAS PAIN RELIEF PATCH EX), Apply 2 patches topically daily as needed (pain)., Disp: , Rfl:    metoprolol succinate (TOPROL XL) 25 MG 24 hr tablet, Take 3 tablets (75 mg total) by mouth 2 (two) times daily., Disp: 540 tablet, Rfl: 3   metoprolol succinate (TOPROL-XL) 50 MG 24 hr tablet, Take 50 mg by mouth 2 (two) times daily. Take with or immediately following a meal., Disp: , Rfl:    Multiple Vitamins-Minerals (MULTIVITAMIN WITH MINERALS) tablet, Take 1 tablet by mouth daily., Disp: , Rfl:    naloxegol oxalate (MOVANTIK) 12.5 MG TABS tablet, Take 12.5 mg by mouth at bedtime., Disp: , Rfl:    nitroGLYCERIN (NITROSTAT) 0.4 MG SL tablet, PLACE 1 TABLET UNDER THE TONGUE EVERY 5 MINUTES AS NEEDED FOR CHEST PAIN., Disp: 25 tablet, Rfl: 5   Omeprazole-Sodium Bicarbonate (ZEGERID PO),  Take 1 capsule by mouth daily as needed (acid reflux)., Disp: , Rfl:    pantoprazole (PROTONIX) 40 MG tablet, Take 1 tablet (40 mg total) by mouth 2 (two) times daily. (Patient taking differently: Take 40 mg by mouth daily.), Disp: 180 tablet, Rfl: 3   Polyethyl Glycol-Propyl Glycol (SYSTANE OP), Place 1 drop into both eyes daily as needed (dry eyes)., Disp: , Rfl:    polyethylene glycol (MIRALAX / GLYCOLAX) 17 g packet, Take 17 g by mouth every other day., Disp: , Rfl:    potassium chloride SA (KLOR-CON M) 20 MEQ tablet, TAKE 1 TABLET BY MOUTH DAILY., Disp: 90 tablet, Rfl: 3   predniSONE (DELTASONE) 5 MG tablet, Take 1 tablet (5 mg total) by mouth daily with breakfast., Disp: 30 tablet, Rfl: 3   rosuvastatin (CRESTOR) 20 MG tablet, Take 1 tablet (20 mg total) by mouth at bedtime. Please keep scheduled appointment for future refills., Disp: 90 tablet, Rfl: 3  sennosides-docusate sodium (SENOKOT-S) 8.6-50 MG tablet, Take 1 tablet by mouth 2 (two) times daily., Disp: , Rfl:    diphenhydrAMINE (BENADRYL) 25 MG tablet, Take 2 tablets (50 mg total) by mouth one hour prior to procedure (Patient not taking: Reported on 09/21/2022), Disp: 100 tablet, Rfl: 0   midodrine (PROAMATINE) 2.5 MG tablet, Take 1 tablet (2.5 mg total) by mouth 2 (two) times daily with a meal. (Patient not taking: Reported on 09/30/2022), Disp: 60 tablet, Rfl: 3   predniSONE (DELTASONE) 50 MG tablet, Take 1 tablet by mouth 13 hours prior, 7 hours prior, and 1 hour prior to procedure (Patient not taking: Reported on 09/30/2022), Disp: 3 tablet, Rfl: 0   Allergies as of 09/30/2022 - Review Complete 09/30/2022  Allergen Reaction Noted   Contrast media [iodinated contrast media] Hives 02/01/2011   Gadolinium derivatives Hives 10/09/2012   Iodine-131 Hives 10/07/2021   Metrizamide Hives 02/01/2011   Tetanus toxoids  10/07/2021    Past Medical History:  Diagnosis Date   Aortic stenosis    moderate aortic stenosis   Arthritis    Benign  prostatic hypertrophy    Chronic systolic heart failure (HCC)    Chronotropic incompetence with sinus node dysfunction    Status post Guidant dual-mode, dual-pacing, dual-sensing  pacemaker   implantation now programmed to AAI with recent generator change.   Coronary artery disease    status post multiple prior percutaneous coronary interventions, microvascular angina per Dr [name]   Diverticulitis sigmoid colon recurrent    Dysfunctional autonomic nervous system    Dyspnea    Heart murmur    History of primary hypertension    Hypercoagulable state (HCC)    chronically anticoagulated with coumadin   Hyperlipidemia    Hyperthyroidism    Hypothyroidism    Dr. [name]   MGUS (monoclonal gammopathy of unknown significance) 02/17/2013   Ocular myasthenia (HCC)    Osteoarthritis    Paroxysmal atrial fibrillation (HCC)    DR. Riley Kill,    Prediabetes 09/21/2017   Stroke (HCC)    1990    Past Surgical History:  Procedure Laterality Date   AORTIC VALVE REPLACEMENT  03/15/2011   Procedure: AORTIC VALVE REPLACEMENT (AVR);  Surgeon: Alleen Borne, MD;  Location: [facility] OR;  Service: Open Heart Surgery;  Laterality: N/A;   APPENDECTOMY     CARDIAC CATHETERIZATION     11   CARDIOVERSION     CARDIOVERSION  04/15/2011   Procedure: CARDIOVERSION;  Surgeon: Marca Ancona, MD;  Location: [facility] ENDOSCOPY;  Service: Cardiovascular;  Laterality: N/A;   CARDIOVERSION N/A 09/11/2014   Procedure: CARDIOVERSION;  Surgeon: Quintella Reichert, MD;  Location: [facility] ENDOSCOPY;  Service: Cardiovascular;  Laterality: N/A;   CARDIOVERSION N/A 06/27/2015   Procedure: CARDIOVERSION;  Surgeon: Vesta Mixer, MD;  Location: [facility] ENDOSCOPY;  Service: Cardiovascular;  Laterality: N/A;   CARDIOVERSION N/A 07/04/2015   Procedure: CARDIOVERSION;  Surgeon: Marinus Maw, MD;  Location: [facility] ENDOSCOPY;  Service: Cardiovascular;  Laterality: N/A;   CARDIOVERSION N/A 04/13/2017   Procedure: CARDIOVERSION;  Surgeon: Quintella Reichert, MD;  Location: [facility] ENDOSCOPY;  Service: Cardiovascular;  Laterality: N/A;   COLONOSCOPY     CORONARY STENT INTERVENTION N/A 10/15/2019   Procedure: CORONARY STENT INTERVENTION;  Surgeon: Corky Crafts, MD;  Location: [facility] INVASIVE CV LAB;  Service: Cardiovascular;  Laterality: N/A;   EP IMPLANTABLE DEVICE N/A 06/16/2015   Procedure: Pacemaker Implant;  Surgeon: Marinus Maw, MD;  Location: [facility] INVASIVE CV LAB;  Service:  Cardiovascular;  Laterality: N/A;   hemrrhoidectomy     LEFT AND RIGHT HEART CATHETERIZATION WITH CORONARY ANGIOGRAM Bilateral 02/01/2011   Procedure: LEFT AND RIGHT HEART CATHETERIZATION WITH CORONARY ANGIOGRAM;  Surgeon: Herby Abraham, MD;  Location: [facility] CATH LAB;  Service: Cardiovascular;  Laterality: Bilateral;   LEFT HEART CATH AND CORONARY ANGIOGRAPHY N/A 10/15/2019   Procedure: LEFT HEART CATH AND CORONARY ANGIOGRAPHY;  Surgeon: Corky Crafts, MD;  Location: [facility] INVASIVE CV LAB;  Service: Cardiovascular;  Laterality: N/A;   LEFT HEART CATH AND CORONARY ANGIOGRAPHY N/A 10/07/2020   Procedure: LEFT HEART CATH AND CORONARY ANGIOGRAPHY;  Surgeon: Tonny Bollman, MD;  Location: [facility] INVASIVE CV LAB;  Service: Cardiovascular;  Laterality: N/A;   LEFT HEART CATH AND CORONARY ANGIOGRAPHY N/A 07/06/2022   Procedure: LEFT HEART CATH AND CORONARY ANGIOGRAPHY;  Surgeon: Tonny Bollman, MD;  Location: [facility] INVASIVE CV LAB;  Service: Cardiovascular;  Laterality: N/A;   LUMBAR LAMINECTOMY/DECOMPRESSION MICRODISCECTOMY Right 02/23/2021   Procedure: Right Lumbar four-five microdiscectomy;  Surgeon: Tia Alert, MD;  Location: [facility] OR;  Service: Neurosurgery;  Laterality: Right;   MAZE  03/15/2011   Procedure: MAZE;  Surgeon: Alleen Borne, MD;  Location: [facility] OR;  Service: Open Heart Surgery;  Laterality: N/A;   MOHS SURGERY  09/2021   PACEMAKER INSERTION  1991   Guidant PPM, most recent Generator Change by Dr [name] was 08/22/06   RIGHT/LEFT HEART CATH AND CORONARY ANGIOGRAPHY  N/A 07/06/2016   Procedure: Right/Left Heart Cath and Coronary Angiography;  Surgeon: Tonny Bollman, MD;  Location: [facility] INVASIVE CV LAB;  Service: Cardiovascular;  Laterality: N/A;   TEE WITHOUT CARDIOVERSION  04/15/2011   Procedure: TRANSESOPHAGEAL ECHOCARDIOGRAM (TEE);  Surgeon: Marca Ancona, MD;  Location: [facility] ENDOSCOPY;  Service: Cardiovascular;  Laterality: N/A;   TEE WITHOUT CARDIOVERSION N/A 09/11/2014   Procedure: TRANSESOPHAGEAL ECHOCARDIOGRAM (TEE);  Surgeon: Quintella Reichert, MD;  Location: [facility] ENDOSCOPY;  Service: Cardiovascular;  Laterality: N/A;    Family History  Problem Relation Age of Onset   Heart disease Brother        Twin brother has coronary disease and recent AVR for AS   CAD Brother    Atrial fibrillation Brother    Sarcoidosis Brother    Depression Daughter    Anorexia nervosa Daughter    Hypertension Son    Anesthesia problems Neg Hx    Hypotension Neg Hx    Malignant hyperthermia Neg Hx    Pseudochol deficiency Neg Hx     Social History   Socioeconomic History   Marital status: Married    Spouse name: Dondra Spry    Number of children: 3   Years of education: Not on file   Highest education level: Not on file  Occupational History   Occupation: Retired    Comment: Physician  Tobacco Use   Smoking status: Never   Smokeless tobacco: Never  Vaping Use   Vaping status: Never Used  Substance and Sexual Activity   Alcohol use: No    Alcohol/week: 0.0 standard drinks of alcohol   Drug use: No   Sexual activity: Not Currently  Other Topics Concern   Not on file  Social History Narrative   Married to [place]. Has grown children   Retired Proofreader MD      Never smoker no alcohol      Social Determinants of Corporate investment banker Strain: Not on file  Food Insecurity: Not on file  Transportation Needs: Not on file  Physical Activity:  Not on file  Stress: Not on file  Social Connections: Not on file  Intimate Partner Violence: Not on file       Review of systems: Review of Systems  Constitutional:  Positive for unexpected weight change.  HENT:  Negative for trouble swallowing.   Gastrointestinal:  Positive for abdominal distention, abdominal pain, constipation and nausea. Negative for anal bleeding, blood in stool, diarrhea, rectal pain and vomiting.       +hiccups +burping       Physical Exam: Vitals:   09/30/22 1013  BP: 110/62  Pulse: 68   Body mass index is 27.87 kg/m.  General: well-appearing   Eyes: sclera anicteric, no redness CV: Irregular, chronic A-fib Resp: clear to auscultation bilaterally, normal RR and effort noted GI: soft, + left lower quadrant tenderness, with active bowel sounds. No guarding or palpable organomegaly noted. Skin; warm and dry, no rash or jaundice noted Neuro: awake, alert and oriented x 3. 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, retired physician with history of longstanding atrial flutter s/p bioprosthetic aortic valve replacement, chronic diastolic heart failure with complaints of worsening left lower abdominal pain He has significant left-sided diverticular disease Will obtain stat CT abdomen pelvis to exclude acute diverticulitis Advised him to start Augmentin 875 mg twice daily if he has worsening abdominal pain or CT is suggestive of acute diverticulitis  Start MiraLAX 1 capful twice daily and add Citrucel or soluble fiber 1 tablespoon twice daily with meals to improve bowel habits, goal 1-2 soft bowel movements per day  Return in 4 to 6 weeks   The patient was provided an opportunity to ask questions and all were answered. The patient agreed with the plan and demonstrated an understanding of the instructions.  Iona Beard , MD  CC: Mahlon Gammon, MD  Ladona Mow [employer] as a  scribe for Marsa Aris, MD.,have documented all relevant documentation on the behalf of Marsa Aris, MD,as directed by  Marsa Aris, MD while in the presence of Marsa Aris, MD.   I, Marsa Aris, MD, have reviewed all documentation for this visit. The documentation on 09/30/22 for the exam, diagnosis, procedures, and orders are all accurate and complete.

## 2022-10-01 ENCOUNTER — Other Ambulatory Visit (HOSPITAL_BASED_OUTPATIENT_CLINIC_OR_DEPARTMENT_OTHER): Payer: Self-pay

## 2022-10-01 ENCOUNTER — Telehealth: Payer: Self-pay | Admitting: Gastroenterology

## 2022-10-01 NOTE — Telephone Encounter (Signed)
Called and discussed results.

## 2022-10-01 NOTE — Telephone Encounter (Signed)
Dr. Lavon Paganini,  Patient called requesting to speak with you regarding CT scan.   Please advise.

## 2022-10-03 ENCOUNTER — Other Ambulatory Visit (HOSPITAL_BASED_OUTPATIENT_CLINIC_OR_DEPARTMENT_OTHER): Payer: Self-pay

## 2022-10-06 ENCOUNTER — Ambulatory Visit: Payer: Medicare Other | Admitting: Cardiovascular Disease

## 2022-10-11 ENCOUNTER — Other Ambulatory Visit (HOSPITAL_BASED_OUTPATIENT_CLINIC_OR_DEPARTMENT_OTHER): Payer: Self-pay

## 2022-10-11 ENCOUNTER — Other Ambulatory Visit: Payer: Self-pay | Admitting: Orthopedic Surgery

## 2022-10-11 MED ORDER — LORAZEPAM 1 MG PO TABS
1.0000 mg | ORAL_TABLET | Freq: Two times a day (BID) | ORAL | 1 refills | Status: DC | PRN
  Filled 2022-10-11: qty 60, 30d supply, fill #0
  Filled 2022-11-05 – 2022-11-09 (×2): qty 60, 30d supply, fill #1

## 2022-10-11 NOTE — Telephone Encounter (Signed)
Patient has request refill on medication Lorazepam 1mg . Patient medication last refilled 07/22/2022.

## 2022-10-12 ENCOUNTER — Other Ambulatory Visit (HOSPITAL_BASED_OUTPATIENT_CLINIC_OR_DEPARTMENT_OTHER): Payer: Self-pay

## 2022-10-12 ENCOUNTER — Other Ambulatory Visit: Payer: Self-pay

## 2022-10-12 DIAGNOSIS — R3914 Feeling of incomplete bladder emptying: Secondary | ICD-10-CM | POA: Diagnosis not present

## 2022-10-12 DIAGNOSIS — N401 Enlarged prostate with lower urinary tract symptoms: Secondary | ICD-10-CM | POA: Diagnosis not present

## 2022-10-12 MED ORDER — DOXAZOSIN MESYLATE 4 MG PO TABS
4.0000 mg | ORAL_TABLET | Freq: Every day | ORAL | 3 refills | Status: DC
  Filled 2022-10-12: qty 90, 90d supply, fill #0
  Filled 2022-12-27 – 2023-01-03 (×2): qty 90, 90d supply, fill #1
  Filled 2023-04-04: qty 90, 90d supply, fill #2
  Filled 2023-07-02 (×2): qty 90, 90d supply, fill #3

## 2022-10-18 ENCOUNTER — Ambulatory Visit: Payer: Medicare Other | Attending: Cardiovascular Disease | Admitting: Cardiovascular Disease

## 2022-10-18 ENCOUNTER — Encounter: Payer: Self-pay | Admitting: Cardiovascular Disease

## 2022-10-18 ENCOUNTER — Other Ambulatory Visit (HOSPITAL_BASED_OUTPATIENT_CLINIC_OR_DEPARTMENT_OTHER): Payer: Self-pay

## 2022-10-18 VITALS — BP 130/90 | HR 63 | Ht 73.0 in | Wt 211.0 lb

## 2022-10-18 DIAGNOSIS — Z952 Presence of prosthetic heart valve: Secondary | ICD-10-CM | POA: Diagnosis not present

## 2022-10-18 DIAGNOSIS — I10 Essential (primary) hypertension: Secondary | ICD-10-CM | POA: Diagnosis not present

## 2022-10-18 DIAGNOSIS — I484 Atypical atrial flutter: Secondary | ICD-10-CM | POA: Diagnosis not present

## 2022-10-18 DIAGNOSIS — I25119 Atherosclerotic heart disease of native coronary artery with unspecified angina pectoris: Secondary | ICD-10-CM | POA: Diagnosis not present

## 2022-10-18 DIAGNOSIS — D6869 Other thrombophilia: Secondary | ICD-10-CM | POA: Insufficient documentation

## 2022-10-18 NOTE — Progress Notes (Signed)
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 He recently underwent cardiac cath to assess progressive angina and this demonstrated stable coronary anatomy with nonobstructive CAD. He is here with his nurse, Selena Batten, today. Reports continued anginal chest pain after eating and with exertion. He is on maximally tolerated medical therapy and they remain reluctant to add isosorbide or increase amlodipine frequency. No resting angina. Shortness of breath is stable. No orthopnea or PND. His chronic pain remains very limiting for him. He is now seeing a pain management specialist.   Past Medical History:  Diagnosis Date   Aortic stenosis    moderate aortic stenosis   Arthritis    Benign prostatic hypertrophy    Chronic systolic heart failure (HCC)    Chronotropic incompetence with sinus node dysfunction    Status post Guidant dual-mode, dual-pacing, dual-sensing  pacemaker   implantation now programmed to AAI with recent generator change.   Coronary artery disease    status post multiple prior percutaneous coronary interventions, microvascular angina per Dr [name]   Diverticulitis sigmoid colon recurrent    Dysfunctional autonomic nervous system    Dyspnea    Heart murmur    History of primary hypertension    Hypercoagulable state (HCC)    chronically anticoagulated with coumadin   Hyperlipidemia     Hyperthyroidism    Hypothyroidism    Dr. [name]   MGUS (monoclonal gammopathy of unknown significance) 02/17/2013   Ocular myasthenia (HCC)    Osteoarthritis    Paroxysmal atrial fibrillation (HCC)    DR. Riley Kill,    Prediabetes 09/21/2017   Stroke (HCC)    1990    Past Surgical History:  Procedure Laterality Date   AORTIC VALVE REPLACEMENT  03/15/2011   Procedure: AORTIC VALVE REPLACEMENT (AVR);  Surgeon: Alleen Borne, MD;  Location: [facility] OR;  Service: Open Heart Surgery;  Laterality: N/A;   APPENDECTOMY     CARDIAC CATHETERIZATION     13   CARDIOVERSION     CARDIOVERSION  04/15/2011   Procedure: CARDIOVERSION;  Surgeon: Marca Ancona, MD;  Location: [facility] ENDOSCOPY;  Service: Cardiovascular;  Laterality: N/A;   CARDIOVERSION N/A 09/11/2014   Procedure: CARDIOVERSION;  Surgeon: Quintella Reichert, MD;  Location: [facility] ENDOSCOPY;  Service: Cardiovascular;  Laterality: N/A;   CARDIOVERSION N/A 06/27/2015   Procedure: CARDIOVERSION;  Surgeon: Vesta Mixer, MD;  Location: [facility] ENDOSCOPY;  Service: Cardiovascular;  Laterality: N/A;   CARDIOVERSION N/A 07/04/2015   Procedure: CARDIOVERSION;  Surgeon: Marinus Maw, MD;  Location: [facility] ENDOSCOPY;  Service: Cardiovascular;  Laterality: N/A;   CARDIOVERSION N/A 04/13/2017   Procedure: CARDIOVERSION;  Surgeon: Quintella Reichert, MD;  Location: [facility] ENDOSCOPY;  Service: Cardiovascular;  Laterality: N/A;   COLONOSCOPY     CORONARY STENT INTERVENTION N/A 10/15/2019   Procedure: CORONARY STENT INTERVENTION;  Surgeon: Corky Crafts, MD;  Location: [facility] INVASIVE CV LAB;  Service: Cardiovascular;  Laterality: N/A;   EP IMPLANTABLE DEVICE N/A 06/16/2015   Procedure: Pacemaker Implant;  Surgeon: Marinus Maw, MD;  Location: [facility] INVASIVE CV LAB;  Service: Cardiovascular;  Laterality: N/A;   hemrrhoidectomy     LEFT AND RIGHT HEART CATHETERIZATION WITH CORONARY ANGIOGRAM Bilateral 02/01/2011   Procedure: LEFT AND RIGHT HEART CATHETERIZATION WITH  CORONARY ANGIOGRAM;  Surgeon: Herby Abraham, MD;  Location: [facility] CATH LAB;  Service: Cardiovascular;  Laterality: Bilateral;   LEFT HEART CATH AND CORONARY ANGIOGRAPHY N/A 10/15/2019   Procedure: LEFT HEART CATH AND CORONARY ANGIOGRAPHY;  Surgeon: Corky Crafts, MD;  Location: [facility] INVASIVE CV LAB;  Service: Cardiovascular;  Laterality: N/A;   LEFT HEART CATH AND CORONARY ANGIOGRAPHY N/A 10/07/2020   Procedure: LEFT HEART CATH AND CORONARY ANGIOGRAPHY;  Surgeon: Tonny Bollman, MD;  Location: [facility] INVASIVE CV LAB;  Service: Cardiovascular;  Laterality: N/A;   LEFT HEART CATH AND CORONARY ANGIOGRAPHY N/A 07/06/2022   Procedure: LEFT HEART CATH AND CORONARY ANGIOGRAPHY;  Surgeon: Tonny Bollman, MD;  Location: [facility] INVASIVE CV LAB;  Service: Cardiovascular;  Laterality: N/A;   LUMBAR LAMINECTOMY/DECOMPRESSION MICRODISCECTOMY Right 02/23/2021   Procedure: Right Lumbar four-five microdiscectomy;  Surgeon: Tia Alert, MD;  Location: [facility] OR;  Service: Neurosurgery;  Laterality: Right;   MAZE  03/15/2011   Procedure: MAZE;  Surgeon: Alleen Borne, MD;  Location: [facility] OR;  Service: Open Heart Surgery;  Laterality: N/A;   MOHS SURGERY  09/2021   PACEMAKER INSERTION  1991   Guidant PPM, most recent Generator Change by Dr [name] was 08/22/06   RIGHT/LEFT HEART CATH AND CORONARY ANGIOGRAPHY N/A 07/06/2016   Procedure: Right/Left Heart Cath and Coronary Angiography;  Surgeon: Tonny Bollman, MD;  Location: [facility] INVASIVE CV LAB;  Service: Cardiovascular;  Laterality: N/A;   TEE WITHOUT CARDIOVERSION  04/15/2011   Procedure: TRANSESOPHAGEAL ECHOCARDIOGRAM (TEE);  Surgeon: Marca Ancona, MD;  Location: [facility] ENDOSCOPY;  Service: Cardiovascular;  Laterality: N/A;   TEE WITHOUT CARDIOVERSION N/A 09/11/2014   Procedure: TRANSESOPHAGEAL ECHOCARDIOGRAM (TEE);  Surgeon: Quintella Reichert, MD;  Location: [facility] ENDOSCOPY;  Service: Cardiovascular;  Laterality: N/A;    Current Medications: Current Meds  Medication Sig    amLODipine (NORVASC) 2.5 MG tablet Take 0.5 tablets (1.25 mg total) by mouth daily. (Patient taking differently: Take 1.25 mg by mouth as needed.)   amoxicillin (AMOXIL) 500 MG capsule Take 4 capsules by mouth 1 hour prior to dental appointment.   amoxicillin-clavulanate (AUGMENTIN) 875-125 MG tablet Take 1 tablet by mouth 2 (two) times daily.   apixaban (ELIQUIS) 5 MG TABS tablet Take 1 tablet (5 mg total) by mouth 2 (two) times daily.   butalbital-acetaminophen-caffeine (BAC) 50-325-40 MG tablet Take 1 tablet by mouth 2 (two) times daily as needed.   cholecalciferol (VITAMIN D3) 25 MCG (1000 UNIT) tablet Take 1,000 Units by mouth daily.   cilostazol (PLETAL) 100 MG tablet Take 1 tablet (100 mg total) by mouth 2 (two) times daily.   Coenzyme Q10 200 MG TABS Take 200 mg by mouth daily.   desoximetasone (TOPICORT) 0.25 % cream Apply 1 Application topically 2 (two) times daily. (Patient taking differently: Apply 1 Application topically 2 (two) times daily as needed (dermatitis).)   diclofenac Sodium (VOLTAREN) 1 % GEL Apply 2 g topically 2 (two) times daily as needed (knee pain).   digoxin (LANOXIN) 0.125 MG tablet Take 1 tablet (0.125 mg total) by mouth daily.   diphenhydrAMINE (BENADRYL) 25 MG tablet Take 2 tablets (50 mg total)  by mouth one hour prior to procedure (Patient taking differently: Take 50 mg by mouth as needed.)   doxazosin (CARDURA) 4 MG tablet Take 1 tablet (4 mg total) by mouth daily.   ezetimibe (ZETIA) 10 MG tablet Take 1 tablet (10 mg total) by mouth daily.   furosemide (LASIX) 20 MG tablet Take 20 mg by mouth daily as needed for fluid.   HYDROcodone-acetaminophen (NORCO) 10-325 MG tablet Take 1 tablet by mouth 2 (two) times daily as directed. (Patient taking differently: Take 1 tablet by mouth 3 (three) times daily.)   lidocaine (SALONPAS PAIN RELIEVING) 4 % Place 2 patches onto the skin daily as needed (pain).   LORazepam (ATIVAN) 1 MG tablet Take 1 tablet (1 mg) by mouth at  bedtime, and 0.5 tablet (0.5 mg) daily as needed for anxiety.   magnesium oxide (MAG-OX) 400 MG tablet Take 1 tablet (400 mg total) by mouth daily.   Menthol-Methyl Salicylate (SALONPAS PAIN RELIEF PATCH EX) Apply 2 patches topically daily as needed (pain).   metoprolol succinate (TOPROL XL) 25 MG 24 hr tablet Take 3 tablets (75 mg total) by mouth 2 (two) times daily. (Patient taking differently: Take 50 mg by mouth 2 (two) times daily.)   midodrine (PROAMATINE) 2.5 MG tablet Take 1 tablet (2.5 mg total) by mouth 2 (two) times daily with a meal. (Patient taking differently: Take 2.5 mg by mouth as needed.)   Multiple Vitamins-Minerals (MULTIVITAMIN WITH MINERALS) tablet Take 1 tablet by mouth daily.   nitroGLYCERIN (NITROSTAT) 0.4 MG SL tablet PLACE 1 TABLET UNDER THE TONGUE EVERY 5 MINUTES AS NEEDED FOR CHEST PAIN.   Omeprazole-Sodium Bicarbonate (ZEGERID PO) Take 1 capsule by mouth daily as needed (acid reflux).   pantoprazole (PROTONIX) 40 MG tablet Take 1 tablet (40 mg total) by mouth 2 (two) times daily. (Patient taking differently: Take 40 mg by mouth daily.)   Polyethyl Glycol-Propyl Glycol (SYSTANE OP) Place 1 drop into both eyes daily as needed (dry eyes).   polyethylene glycol (MIRALAX / GLYCOLAX) 17 g packet Take 17 g by mouth every other day.   potassium chloride SA (KLOR-CON M) 20 MEQ tablet TAKE 1 TABLET BY MOUTH DAILY.   predniSONE (DELTASONE) 5 MG tablet Take 1 tablet (5 mg total) by mouth daily with breakfast.   rosuvastatin (CRESTOR) 20 MG tablet Take 1 tablet (20 mg total) by mouth at bedtime. Please keep scheduled appointment for future refills.   sennosides-docusate sodium (SENOKOT-S) 8.6-50 MG tablet Take 1 tablet by mouth 2 (two) times daily.   [DISCONTINUED] butalbital-acetaminophen-caffeine (FIORICET) 50-325-40 MG tablet Take 1 tablet by mouth 2 (two) times daily as needed for headache.   [DISCONTINUED] HYDROcodone-acetaminophen (NORCO) 7.5-325 MG tablet Take 1 tablet by  mouth every 8 (eight) hours as needed for pain   [DISCONTINUED] metoprolol succinate (TOPROL-XL) 50 MG 24 hr tablet Take 50 mg by mouth 2 (two) times daily. Take with or immediately following a meal.   [DISCONTINUED] naloxegol oxalate (MOVANTIK) 12.5 MG TABS tablet Take 12.5 mg by mouth at bedtime.   [DISCONTINUED] predniSONE (DELTASONE) 50 MG tablet Take 1 tablet by mouth 13 hours prior, 7 hours prior, and 1 hour prior to procedure     Allergies:   Contrast media [iodinated contrast media], Gadolinium derivatives, Iodine-131, Metrizamide, and Tetanus toxoids   Social History   Socioeconomic History   Marital status: Married    Spouse name: Dondra Spry    Number of children: 3   Years of education: Not on file  Highest education level: Not on file  Occupational History   Occupation: Retired    Comment: Physician  Tobacco Use   Smoking status: Never   Smokeless tobacco: Never  Vaping Use   Vaping status: Never Used  Substance and Sexual Activity   Alcohol use: No    Alcohol/week: 0.0 standard drinks of alcohol   Drug use: No   Sexual activity: Not Currently  Other Topics Concern   Not on file  Social History Narrative   Married to [place]. Has grown children   Retired Proofreader MD      Never smoker no alcohol      Social Determinants of Corporate investment banker Strain: Not on file  Food Insecurity: Not on file  Transportation Needs: Not on file  Physical Activity: Not on file  Stress: Not on file  Social Connections: Not on file     Family History: The patient's family history includes Anorexia nervosa in his daughter; Atrial fibrillation in his brother; CAD in his brother; Depression in his daughter; Heart disease in his brother; Hypertension in his son; Sarcoidosis in his brother. There is no history of Anesthesia problems, Hypotension, Malignant hyperthermia, or Pseudochol deficiency.  ROS:   Please see the history of present illness.    All other systems reviewed and  are negative.  EKGs/Labs/Other Studies Reviewed:    The following studies were reviewed today: Cardiac Catheterization 07/06/2022:   Prox RCA lesion is 30% stenosed.   Mid RCA lesion is 40% stenosed.   Mid Cx to Dist Cx lesion is 20% stenosed.   Ost LAD to Prox LAD lesion is 40% stenosed.   Non-stenotic Dist RCA lesion was previously treated.   LV end diastolic pressure is normal.   Widely patent left main with no stenosis Nonobstructive proximal LAD stenosis unchanged from prior cath studies Widely patent LCx stent with no significant stenosis in the LCx distribution Patent RCA with mild nonobstructive disease and patent distal RCA stent Normal LVEDP   Recommend: continue antianginal Rx and medical treatment of nonobstructive CAD. Resume apixaban tomorrow morning.      Recent Labs: 08/27/2022: ALT 13; Hemoglobin 11.2; Platelet Count 216; TSH 4.506 09/30/2022: BUN 18; Creatinine, Ser 1.09; Potassium 4.2; Sodium 138  Recent Lipid Panel    Component Value Date/Time   CHOL 131 04/13/2021 0109   CHOL 158 09/02/2017 1006   TRIG 27 04/13/2021 0109   HDL 62 04/13/2021 0109   HDL 72 09/02/2017 1006   CHOLHDL 2.1 04/13/2021 0109   VLDL 5 04/13/2021 0109   LDLCALC 64 04/13/2021 0109   LDLCALC 68 09/02/2017 1006   LDLDIRECT 117.9 11/16/2010 1355     Risk Assessment/Calculations:    CHA2DS2-VASc Score = 5   This indicates a 7.2% annual risk of stroke. The patient's score is based upon: CHF History: 1 HTN History: 1 Diabetes History: 0 Stroke History: 0 Vascular Disease History: 1 Age Score: 2 Gender Score: 0           Physical Exam:    VS:  BP (!) 130/90   Pulse 63   Ht 6\' 1"  (1.854 m)   Wt 211 lb (95.7 kg)   SpO2 94%   BMI 27.84 kg/m     Wt Readings from Last 3 Encounters:  10/18/22 211 lb (95.7 kg)  09/30/22 211 lb 4 oz (95.8 kg)  09/21/22 210 lb (95.3 kg)     GEN:  Well nourished, well developed in no acute distress HEENT: Normal NECK:  No JVD; No carotid  bruits LYMPHATICS: No lymphadenopathy CARDIAC: RRR, soft ejection murmur at the RUSB RESPIRATORY:  Clear to auscultation without rales, wheezing or rhonchi  ABDOMEN: Soft, non-tender, non-distended MUSCULOSKELETAL:  No edema; No deformity  SKIN: Warm and dry NEUROLOGIC:  Alert and oriented x 3 PSYCHIATRIC:  Normal affect   ASSESSMENT:    1. Coronary artery disease involving native coronary artery of native heart with angina pectoris (HCC)   2. S/P AVR (aortic valve replacement)   3. Essential hypertension   4. Atypical atrial flutter (HCC)   5. Secondary hypercoagulable state (HCC)    PLAN:    In order of problems listed above:  He continues to have significant angina associated with eating (post-prandrial) and with exertion. He isn't able to tolerate aggressive anti-anginal Rx as he tends to drop his BP easily. Will continue current Rx. Recent cath with stable findings of nonobstructive disease noted. Normal prosthetic aortic valve function. Echo from 04/2022 reviewed - mean gradient 8 mmHg.  BP controlled, he takes amlodipine as needed but has a tendency for hypotension if he takes it daily Stable, tolerating anticoagulation No bleeding complications reported - bruises easily.           Medication Adjustments/Labs and Tests Ordered: Current medicines are reviewed at length with the patient today.  Concerns regarding medicines are outlined above.  No orders of the defined types were placed in this encounter.  No orders of the defined types were placed in this encounter.   Patient Instructions  Medication Instructions:  Your physician recommends that you continue on your current medications as directed. Please refer to the Current Medication list given to you today.  *If you need a refill on your cardiac medications before your next appointment, please call your pharmacy*   Lab Work: NONE If you have labs (blood work) drawn today and your tests are completely normal,  you will receive your results only by: MyChart Message (if you have MyChart) OR A paper copy in the mail If you have any lab test that is abnormal or we need to change your treatment, we will call you to review the results.   Testing/Procedures: NONE   Follow-Up: At [facility], you and your health needs are our priority.  As part of our continuing mission to provide you with exceptional heart care, we have created designated Provider Care Teams.  These Care Teams include your primary Cardiologist (physician) and Advanced Practice Providers (APPs -  Physician Assistants and Nurse Practitioners) who all work together to provide you with the care you need, when you need it.  We recommend signing up for the patient portal called "MyChart".  Sign up information is provided on this After Visit Summary.  MyChart is used to connect with patients for Virtual Visits (Telemedicine).  Patients are able to view lab/test results, encounter notes, upcoming appointments, etc.  Non-urgent messages can be sent to your provider as well.   To learn more about what you can do with MyChart, go to [email or web address].    Your next appointment:   6 month(s)  Provider:   Tonny Bollman, MD     Signed, Tonny Bollman, MD  10/18/2022 4:56 PM    [facility]

## 2022-10-18 NOTE — Patient Instructions (Signed)
Medication Instructions:  Your physician recommends that you continue on your current medications as directed. Please refer to the Current Medication list given to you today.  *If you need a refill on your cardiac medications before your next appointment, please call your pharmacy*   Lab Work: NONE If you have labs (blood work) drawn today and your tests are completely normal, you will receive your results only by: MyChart Message (if you have MyChart) OR A paper copy in the mail If you have any lab test that is abnormal or we need to change your treatment, we will call you to review the results.   Testing/Procedures: NONE   Follow-Up: At [facility], you and your health needs are our priority.  As part of our continuing mission to provide you with exceptional heart care, we have created designated Provider Care Teams.  These Care Teams include your primary Cardiologist (physician) and Advanced Practice Providers (APPs -  Physician Assistants and Nurse Practitioners) who all work together to provide you with the care you need, when you need it.  We recommend signing up for the patient portal called "MyChart".  Sign up information is provided on this After Visit Summary.  MyChart is used to connect with patients for Virtual Visits (Telemedicine).  Patients are able to view lab/test results, encounter notes, upcoming appointments, etc.  Non-urgent messages can be sent to your provider as well.   To learn more about what you can do with MyChart, go to [email or web address].    Your next appointment:   6 month(s)  Provider:   Michael Cooper, MD   

## 2022-10-19 ENCOUNTER — Other Ambulatory Visit (HOSPITAL_BASED_OUTPATIENT_CLINIC_OR_DEPARTMENT_OTHER): Payer: Self-pay

## 2022-10-19 MED ORDER — HYDROCODONE-ACETAMINOPHEN 10-325 MG PO TABS
1.0000 | ORAL_TABLET | Freq: Three times a day (TID) | ORAL | 0 refills | Status: DC
  Filled 2022-10-19 – 2022-10-20 (×4): qty 90, 30d supply, fill #0

## 2022-10-20 ENCOUNTER — Other Ambulatory Visit: Payer: Self-pay

## 2022-10-20 ENCOUNTER — Encounter: Payer: Self-pay | Admitting: Internal Medicine

## 2022-10-20 ENCOUNTER — Other Ambulatory Visit (HOSPITAL_BASED_OUTPATIENT_CLINIC_OR_DEPARTMENT_OTHER): Payer: Self-pay

## 2022-10-20 DIAGNOSIS — Z79891 Long term (current) use of opiate analgesic: Secondary | ICD-10-CM | POA: Diagnosis not present

## 2022-10-20 DIAGNOSIS — M48062 Spinal stenosis, lumbar region with neurogenic claudication: Secondary | ICD-10-CM | POA: Diagnosis not present

## 2022-10-20 DIAGNOSIS — G894 Chronic pain syndrome: Secondary | ICD-10-CM | POA: Diagnosis not present

## 2022-10-20 DIAGNOSIS — M47812 Spondylosis without myelopathy or radiculopathy, cervical region: Secondary | ICD-10-CM | POA: Diagnosis not present

## 2022-10-20 MED ORDER — BUTALBITAL-APAP-CAFFEINE 50-325-40 MG PO TABS
1.0000 | ORAL_TABLET | Freq: Three times a day (TID) | ORAL | 2 refills | Status: DC | PRN
  Filled 2022-10-20 (×2): qty 90, 30d supply, fill #0
  Filled 2022-11-16: qty 90, 30d supply, fill #1
  Filled 2022-12-08: qty 90, 30d supply, fill #2

## 2022-10-21 ENCOUNTER — Ambulatory Visit: Payer: Medicare Other | Admitting: Cardiovascular Disease

## 2022-10-22 ENCOUNTER — Other Ambulatory Visit (HOSPITAL_BASED_OUTPATIENT_CLINIC_OR_DEPARTMENT_OTHER): Payer: Self-pay

## 2022-10-27 ENCOUNTER — Other Ambulatory Visit (HOSPITAL_BASED_OUTPATIENT_CLINIC_OR_DEPARTMENT_OTHER): Payer: Self-pay

## 2022-11-02 ENCOUNTER — Other Ambulatory Visit: Payer: Self-pay

## 2022-11-02 ENCOUNTER — Other Ambulatory Visit (HOSPITAL_BASED_OUTPATIENT_CLINIC_OR_DEPARTMENT_OTHER): Payer: Self-pay

## 2022-11-02 ENCOUNTER — Encounter: Payer: Self-pay | Admitting: Gastroenterology

## 2022-11-02 DIAGNOSIS — R1030 Lower abdominal pain, unspecified: Secondary | ICD-10-CM

## 2022-11-02 DIAGNOSIS — K5732 Diverticulitis of large intestine without perforation or abscess without bleeding: Secondary | ICD-10-CM

## 2022-11-02 NOTE — Addendum Note (Signed)
Addended by: Heber Dresser A on: 11/02/2022 04:16 PM   Modules accepted: Orders

## 2022-11-03 ENCOUNTER — Other Ambulatory Visit: Payer: Self-pay | Admitting: Gastroenterology

## 2022-11-03 ENCOUNTER — Other Ambulatory Visit: Payer: Self-pay | Admitting: Internal Medicine

## 2022-11-03 ENCOUNTER — Encounter (HOSPITAL_COMMUNITY): Payer: Self-pay

## 2022-11-03 ENCOUNTER — Ambulatory Visit (HOSPITAL_COMMUNITY): Payer: Medicare Other

## 2022-11-03 ENCOUNTER — Ambulatory Visit (HOSPITAL_COMMUNITY)
Admission: RE | Admit: 2022-11-03 | Discharge: 2022-11-03 | Disposition: A | Discharge status: Home / Self Care | Payer: Medicare Other | Source: Ambulatory Visit | Attending: Gastroenterology | Admitting: Gastroenterology

## 2022-11-03 DIAGNOSIS — I2510 Atherosclerotic heart disease of native coronary artery without angina pectoris: Secondary | ICD-10-CM | POA: Diagnosis not present

## 2022-11-03 DIAGNOSIS — K5732 Diverticulitis of large intestine without perforation or abscess without bleeding: Secondary | ICD-10-CM | POA: Insufficient documentation

## 2022-11-03 DIAGNOSIS — R1030 Lower abdominal pain, unspecified: Secondary | ICD-10-CM | POA: Insufficient documentation

## 2022-11-03 DIAGNOSIS — K5792 Diverticulitis of intestine, part unspecified, without perforation or abscess without bleeding: Secondary | ICD-10-CM | POA: Diagnosis not present

## 2022-11-03 DIAGNOSIS — K8020 Calculus of gallbladder without cholecystitis without obstruction: Secondary | ICD-10-CM | POA: Diagnosis not present

## 2022-11-03 DIAGNOSIS — N281 Cyst of kidney, acquired: Secondary | ICD-10-CM | POA: Diagnosis not present

## 2022-11-03 MED ORDER — BARIUM SULFATE 0.1 % PO SUSP
ORAL | Status: AC
  Filled 2022-11-03: qty 2

## 2022-11-03 MED ORDER — BARIUM SULFATE 2 % PO SUSP
900.0000 mL | Freq: Once | ORAL | Status: AC
  Administered 2022-11-03: 900 mL via ORAL

## 2022-11-04 ENCOUNTER — Other Ambulatory Visit (HOSPITAL_BASED_OUTPATIENT_CLINIC_OR_DEPARTMENT_OTHER): Payer: Self-pay

## 2022-11-04 ENCOUNTER — Other Ambulatory Visit: Payer: Self-pay

## 2022-11-04 LAB — CBC WITH DIFFERENTIAL/PLATELET
Basophils Absolute: 0 10*3/uL (ref 0.0–0.2)
Basos: 1 %
EOS (ABSOLUTE): 0.2 10*3/uL (ref 0.0–0.4)
Eos: 3 %
Hematocrit: 35 % — ABNORMAL LOW (ref 37.5–51.0)
Hemoglobin: 11.1 g/dL — ABNORMAL LOW (ref 13.0–17.7)
Immature Grans (Abs): 0.1 10*3/uL (ref 0.0–0.1)
Immature Granulocytes: 2 %
Lymphocytes Absolute: 1.1 10*3/uL (ref 0.7–3.1)
Lymphs: 14 %
MCH: 28.5 pg (ref 26.6–33.0)
MCHC: 31.7 g/dL (ref 31.5–35.7)
MCV: 90 fL (ref 79–97)
Monocytes Absolute: 0.6 10*3/uL (ref 0.1–0.9)
Monocytes: 8 %
Neutrophils Absolute: 5.5 10*3/uL (ref 1.4–7.0)
Neutrophils: 72 %
Platelets: 168 10*3/uL (ref 150–450)
RBC: 3.89 x10E6/uL — ABNORMAL LOW (ref 4.14–5.80)
RDW: 13.4 % (ref 11.6–15.4)
WBC: 7.5 10*3/uL (ref 3.4–10.8)

## 2022-11-04 LAB — BASIC METABOLIC PANEL
BUN/Creatinine Ratio: 13 (ref 10–24)
BUN: 14 mg/dL (ref 10–36)
CO2: 24 mmol/L (ref 20–29)
Calcium: 9.4 mg/dL (ref 8.6–10.2)
Chloride: 100 mmol/L (ref 96–106)
Creatinine, Ser: 1.08 mg/dL (ref 0.76–1.27)
Glucose: 105 mg/dL — ABNORMAL HIGH (ref 70–99)
Potassium: 6.5 mmol/L — ABNORMAL HIGH (ref 3.5–5.2)
Sodium: 138 mmol/L (ref 134–144)
eGFR: 64 mL/min/{1.73_m2} (ref >59)

## 2022-11-04 MED ORDER — DIGOXIN 125 MCG PO TABS
0.1250 mg | ORAL_TABLET | Freq: Every day | ORAL | 1 refills | Status: DC
  Filled 2022-11-04 – 2022-12-06 (×2): qty 90, 90d supply, fill #0
  Filled 2023-02-19 – 2023-02-24 (×2): qty 90, 90d supply, fill #1

## 2022-11-05 ENCOUNTER — Other Ambulatory Visit: Payer: Self-pay | Admitting: *Deleted

## 2022-11-05 ENCOUNTER — Other Ambulatory Visit (INDEPENDENT_AMBULATORY_CARE_PROVIDER_SITE_OTHER): Payer: Medicare Other

## 2022-11-05 ENCOUNTER — Telehealth: Payer: Self-pay | Admitting: Gastroenterology

## 2022-11-05 ENCOUNTER — Other Ambulatory Visit (HOSPITAL_BASED_OUTPATIENT_CLINIC_OR_DEPARTMENT_OTHER): Payer: Self-pay

## 2022-11-05 DIAGNOSIS — R1030 Lower abdominal pain, unspecified: Secondary | ICD-10-CM

## 2022-11-05 DIAGNOSIS — G8929 Other chronic pain: Secondary | ICD-10-CM

## 2022-11-05 DIAGNOSIS — R1032 Left lower quadrant pain: Secondary | ICD-10-CM

## 2022-11-05 DIAGNOSIS — K5732 Diverticulitis of large intestine without perforation or abscess without bleeding: Secondary | ICD-10-CM

## 2022-11-05 DIAGNOSIS — K5792 Diverticulitis of intestine, part unspecified, without perforation or abscess without bleeding: Secondary | ICD-10-CM | POA: Diagnosis not present

## 2022-11-05 LAB — BASIC METABOLIC PANEL
BUN: 14 mg/dL (ref 6–23)
CO2: 32 mEq/L (ref 19–32)
Calcium: 9.2 mg/dL (ref 8.4–10.5)
Chloride: 102 meq/L (ref 96–112)
Creatinine, Ser: 1.14 mg/dL (ref 0.40–1.50)
GFR: 58.37 mL/min — ABNORMAL LOW (ref >60.00)
Glucose, Bld: 123 mg/dL — ABNORMAL HIGH (ref 70–99)
Potassium: 4 meq/L (ref 3.5–5.1)
Sodium: 141 meq/L (ref 135–145)

## 2022-11-05 LAB — CBC WITH DIFFERENTIAL/PLATELET
Basophils Absolute: 0 10*3/uL (ref 0.0–0.1)
Basophils Relative: 0.5 % (ref 0.0–3.0)
Eosinophils Absolute: 0.4 10*3/uL (ref 0.0–0.7)
Eosinophils Relative: 5.4 % — ABNORMAL HIGH (ref 0.0–5.0)
HCT: 34.7 % — ABNORMAL LOW (ref 39.0–52.0)
Hemoglobin: 11.3 g/dL — ABNORMAL LOW (ref 13.0–17.0)
Lymphocytes Relative: 16.1 % (ref 12.0–46.0)
Lymphs Abs: 1.1 10*3/uL (ref 0.7–4.0)
MCHC: 32.7 g/dL (ref 30.0–36.0)
MCV: 86 fl (ref 78.0–100.0)
Monocytes Absolute: 0.5 10*3/uL (ref 0.1–1.0)
Monocytes Relative: 8 % (ref 3.0–12.0)
Neutro Abs: 4.8 10*3/uL (ref 1.4–7.7)
Neutrophils Relative %: 70 % (ref 43.0–77.0)
Platelets: 216 10*3/uL (ref 150.0–400.0)
RBC: 4.03 Mil/uL — ABNORMAL LOW (ref 4.22–5.81)
RDW: 15.4 % (ref 11.5–15.5)
WBC: 6.9 10*3/uL (ref 4.0–10.5)

## 2022-11-05 NOTE — Telephone Encounter (Signed)
Spoke with phlebotomist at [facility]. The labs done 11/02/22 are being faxed over. The year of birth was entered incorrectly which prevented [employer] from accepting the information.

## 2022-11-05 NOTE — Telephone Encounter (Signed)
Inbound call from [facility] requesting to speak with [facility] regarding lab orders for patient. Call back number is [phone]. Please advise, thank you.

## 2022-11-08 ENCOUNTER — Other Ambulatory Visit: Payer: Self-pay

## 2022-11-08 ENCOUNTER — Other Ambulatory Visit (HOSPITAL_BASED_OUTPATIENT_CLINIC_OR_DEPARTMENT_OTHER): Payer: Self-pay

## 2022-11-09 ENCOUNTER — Other Ambulatory Visit: Payer: Self-pay | Admitting: *Deleted

## 2022-11-09 ENCOUNTER — Other Ambulatory Visit: Payer: Self-pay

## 2022-11-09 ENCOUNTER — Other Ambulatory Visit: Payer: Self-pay | Admitting: Internal Medicine

## 2022-11-09 ENCOUNTER — Other Ambulatory Visit (HOSPITAL_BASED_OUTPATIENT_CLINIC_OR_DEPARTMENT_OTHER): Payer: Self-pay

## 2022-11-09 MED ORDER — CIPROFLOXACIN HCL 500 MG PO TABS
500.0000 mg | ORAL_TABLET | Freq: Two times a day (BID) | ORAL | 0 refills | Status: DC
  Filled 2022-11-09: qty 20, 10d supply, fill #0

## 2022-11-09 MED ORDER — NALOXEGOL OXALATE 12.5 MG PO TABS
12.5000 mg | ORAL_TABLET | Freq: Every day | ORAL | 2 refills | Status: DC
  Filled 2022-11-09: qty 30, 30d supply, fill #0
  Filled 2022-11-30: qty 30, 30d supply, fill #1

## 2022-11-09 MED ORDER — METRONIDAZOLE 500 MG PO TABS
500.0000 mg | ORAL_TABLET | Freq: Three times a day (TID) | ORAL | 0 refills | Status: DC
  Filled 2022-11-09: qty 30, 10d supply, fill #0

## 2022-11-10 ENCOUNTER — Other Ambulatory Visit: Payer: Self-pay

## 2022-11-10 ENCOUNTER — Other Ambulatory Visit (HOSPITAL_BASED_OUTPATIENT_CLINIC_OR_DEPARTMENT_OTHER): Payer: Self-pay

## 2022-11-10 ENCOUNTER — Other Ambulatory Visit: Payer: Self-pay | Admitting: Cardiovascular Disease

## 2022-11-10 DIAGNOSIS — I4819 Other persistent atrial fibrillation: Secondary | ICD-10-CM

## 2022-11-10 MED ORDER — APIXABAN 5 MG PO TABS
5.0000 mg | ORAL_TABLET | Freq: Two times a day (BID) | ORAL | 1 refills | Status: DC
  Filled 2022-11-10 – 2022-12-08 (×2): qty 180, 90d supply, fill #0
  Filled 2023-02-19 – 2023-02-24 (×2): qty 180, 90d supply, fill #1

## 2022-11-10 MED ORDER — LORAZEPAM 1 MG PO TABS
1.0000 mg | ORAL_TABLET | Freq: Two times a day (BID) | ORAL | 5 refills | Status: DC | PRN
  Filled 2022-11-10 – 2022-12-13 (×2): qty 60, 30d supply, fill #0
  Filled 2023-01-20: qty 60, 30d supply, fill #1
  Filled 2023-02-19: qty 60, 30d supply, fill #2
  Filled 2023-04-13: qty 60, 30d supply, fill #3
  Filled 2023-05-08 – 2023-05-12 (×2): qty 60, 30d supply, fill #4

## 2022-11-10 MED ORDER — PREDNISONE 5 MG PO TABS
5.0000 mg | ORAL_TABLET | Freq: Every day | ORAL | 4 refills | Status: DC
  Filled 2022-11-10: qty 90, 90d supply, fill #0

## 2022-11-10 NOTE — Telephone Encounter (Signed)
Prescription refill request for Eliquis received. Indication:Aflutter Last office visit: 10/18/22 [name])  Scr: 1.14 (11/05/22)  Age: 88 Weight: 95.7kg  Appropriate dose. Refill sent.

## 2022-11-10 NOTE — Telephone Encounter (Signed)
Patient is requesting a refill of the following medications: Requested Prescriptions   Pending Prescriptions Disp Refills   predniSONE (DELTASONE) 5 MG tablet 30 tablet 11    Sig: Take 1 tablet (5 mg total) by mouth daily with breakfast.   LORazepam (ATIVAN) 1 MG tablet 60 tablet 5    Sig: Take 1 tablet (1 mg) by mouth at bedtime, and 0.5 tablet (0.5 mg) daily as needed for anxiety.   Information below pertains to Lorazepan (controlled substance)  Date of last refill: 10/11/22  Refill amount:  60/1 refill   Treatment agreement date: Not on file, notation made on pending appointment

## 2022-11-11 ENCOUNTER — Other Ambulatory Visit (HOSPITAL_BASED_OUTPATIENT_CLINIC_OR_DEPARTMENT_OTHER): Payer: Self-pay

## 2022-11-16 ENCOUNTER — Other Ambulatory Visit (HOSPITAL_BASED_OUTPATIENT_CLINIC_OR_DEPARTMENT_OTHER): Payer: Self-pay

## 2022-11-16 DIAGNOSIS — M47812 Spondylosis without myelopathy or radiculopathy, cervical region: Secondary | ICD-10-CM | POA: Diagnosis not present

## 2022-11-16 DIAGNOSIS — G894 Chronic pain syndrome: Secondary | ICD-10-CM | POA: Diagnosis not present

## 2022-11-16 DIAGNOSIS — M48062 Spinal stenosis, lumbar region with neurogenic claudication: Secondary | ICD-10-CM | POA: Diagnosis not present

## 2022-11-16 DIAGNOSIS — Z79891 Long term (current) use of opiate analgesic: Secondary | ICD-10-CM | POA: Diagnosis not present

## 2022-11-16 MED ORDER — HYDROCODONE-ACETAMINOPHEN 10-325 MG PO TABS
1.0000 | ORAL_TABLET | Freq: Three times a day (TID) | ORAL | 0 refills | Status: DC
  Filled 2022-11-17 (×2): qty 90, 30d supply, fill #0

## 2022-11-17 ENCOUNTER — Other Ambulatory Visit (HOSPITAL_BASED_OUTPATIENT_CLINIC_OR_DEPARTMENT_OTHER): Payer: Self-pay

## 2022-11-17 ENCOUNTER — Other Ambulatory Visit: Payer: Self-pay

## 2022-11-18 ENCOUNTER — Other Ambulatory Visit: Payer: Self-pay | Admitting: Rheumatology

## 2022-11-18 ENCOUNTER — Other Ambulatory Visit (HOSPITAL_COMMUNITY): Payer: Self-pay

## 2022-11-18 DIAGNOSIS — H524 Presbyopia: Secondary | ICD-10-CM | POA: Diagnosis not present

## 2022-11-18 DIAGNOSIS — H52223 Regular astigmatism, bilateral: Secondary | ICD-10-CM | POA: Diagnosis not present

## 2022-11-18 DIAGNOSIS — H5203 Hypermetropia, bilateral: Secondary | ICD-10-CM | POA: Diagnosis not present

## 2022-11-18 DIAGNOSIS — H2513 Age-related nuclear cataract, bilateral: Secondary | ICD-10-CM | POA: Diagnosis not present

## 2022-11-18 DIAGNOSIS — Z7952 Long term (current) use of systemic steroids: Secondary | ICD-10-CM

## 2022-11-18 DIAGNOSIS — H53143 Visual discomfort, bilateral: Secondary | ICD-10-CM | POA: Diagnosis not present

## 2022-11-18 DIAGNOSIS — G7000 Myasthenia gravis without (acute) exacerbation: Secondary | ICD-10-CM | POA: Diagnosis not present

## 2022-11-20 ENCOUNTER — Other Ambulatory Visit: Payer: Self-pay | Admitting: Internal Medicine

## 2022-11-20 DIAGNOSIS — I209 Angina pectoris, unspecified: Secondary | ICD-10-CM

## 2022-11-22 ENCOUNTER — Other Ambulatory Visit (HOSPITAL_BASED_OUTPATIENT_CLINIC_OR_DEPARTMENT_OTHER): Payer: Self-pay

## 2022-11-22 MED ORDER — NITROGLYCERIN 0.4 MG SL SUBL
0.4000 mg | SUBLINGUAL_TABLET | SUBLINGUAL | 5 refills | Status: DC | PRN
  Filled 2022-12-16: qty 25, 8d supply, fill #0
  Filled 2023-02-19: qty 25, 8d supply, fill #1
  Filled 2023-05-08: qty 25, 8d supply, fill #2
  Filled 2023-09-15: qty 25, 8d supply, fill #3

## 2022-11-25 ENCOUNTER — Encounter: Payer: Self-pay | Admitting: Internal Medicine

## 2022-11-25 ENCOUNTER — Other Ambulatory Visit: Payer: Self-pay

## 2022-11-25 DIAGNOSIS — M8000XA Age-related osteoporosis with current pathological fracture, unspecified site, initial encounter for fracture: Secondary | ICD-10-CM

## 2022-11-25 DIAGNOSIS — Z7952 Long term (current) use of systemic steroids: Secondary | ICD-10-CM

## 2022-11-25 NOTE — Telephone Encounter (Signed)
Order pended for your approval.  Thanks!

## 2022-11-29 ENCOUNTER — Encounter: Payer: Self-pay | Admitting: Gastroenterology

## 2022-11-29 NOTE — Addendum Note (Signed)
Addended by: Cephus Richer on: 11/29/2022 08:53 PM   Modules accepted: Orders

## 2022-11-30 ENCOUNTER — Other Ambulatory Visit (HOSPITAL_BASED_OUTPATIENT_CLINIC_OR_DEPARTMENT_OTHER): Payer: Self-pay

## 2022-12-02 ENCOUNTER — Telehealth: Payer: Self-pay

## 2022-12-02 ENCOUNTER — Encounter: Payer: Self-pay | Admitting: Gastroenterology

## 2022-12-02 ENCOUNTER — Other Ambulatory Visit: Payer: Self-pay

## 2022-12-02 DIAGNOSIS — M818 Other osteoporosis without current pathological fracture: Secondary | ICD-10-CM

## 2022-12-02 NOTE — Telephone Encounter (Signed)
Dexa 12/07/22 at 10:30 am Office visit 12/07/22 at 11:30 am. Selena Batten notified.

## 2022-12-02 NOTE — Telephone Encounter (Signed)
Its fine

## 2022-12-02 NOTE — Telephone Encounter (Signed)
Patient needs a DEXA. He is asking to have it done here at our building. Will you be willing to order this?

## 2022-12-04 ENCOUNTER — Other Ambulatory Visit (HOSPITAL_BASED_OUTPATIENT_CLINIC_OR_DEPARTMENT_OTHER): Payer: Self-pay

## 2022-12-06 ENCOUNTER — Other Ambulatory Visit (HOSPITAL_BASED_OUTPATIENT_CLINIC_OR_DEPARTMENT_OTHER): Payer: Self-pay

## 2022-12-06 ENCOUNTER — Ambulatory Visit (INDEPENDENT_AMBULATORY_CARE_PROVIDER_SITE_OTHER): Payer: Medicare Other

## 2022-12-06 ENCOUNTER — Other Ambulatory Visit: Payer: Self-pay | Admitting: Cardiovascular Disease

## 2022-12-06 ENCOUNTER — Other Ambulatory Visit: Payer: Self-pay

## 2022-12-06 DIAGNOSIS — I495 Sick sinus syndrome: Secondary | ICD-10-CM

## 2022-12-06 LAB — CUP PACEART REMOTE DEVICE CHECK
Battery Remaining Longevity: 42 mo
Battery Remaining Percentage: 41 %
Brady Statistic RA Percent Paced: 0 %
Brady Statistic RV Percent Paced: 70 %
Date Time Interrogation Session: 20241007021000
Implantable Lead Connection Status: 753985
Implantable Lead Connection Status: 753985
Implantable Lead Implant Date: 20170417
Implantable Lead Implant Date: 20170417
Implantable Lead Location: 753859
Implantable Lead Location: 753860
Implantable Lead Model: 7740
Implantable Lead Model: 7741
Implantable Lead Serial Number: 662696
Implantable Lead Serial Number: 751382
Implantable Pulse Generator Implant Date: 20170417
Lead Channel Impedance Value: 682 Ohm
Lead Channel Impedance Value: 758 Ohm
Lead Channel Setting Pacing Amplitude: 2.5 V
Lead Channel Setting Pacing Amplitude: 2.5 V
Lead Channel Setting Pacing Pulse Width: 0.4 ms
Lead Channel Setting Sensing Sensitivity: 2.5 mV
Pulse Gen Serial Number: 718418
Zone Setting Status: 755011

## 2022-12-07 ENCOUNTER — Ambulatory Visit (INDEPENDENT_AMBULATORY_CARE_PROVIDER_SITE_OTHER)
Admission: RE | Admit: 2022-12-07 | Discharge: 2022-12-07 | Disposition: A | Discharge status: Home / Self Care | Payer: Medicare Other | Source: Ambulatory Visit | Attending: Gastroenterology

## 2022-12-07 ENCOUNTER — Encounter: Payer: Self-pay | Admitting: Gastroenterology

## 2022-12-07 ENCOUNTER — Ambulatory Visit (INDEPENDENT_AMBULATORY_CARE_PROVIDER_SITE_OTHER): Payer: Medicare Other | Admitting: Gastroenterology

## 2022-12-07 ENCOUNTER — Other Ambulatory Visit (HOSPITAL_BASED_OUTPATIENT_CLINIC_OR_DEPARTMENT_OTHER): Payer: Self-pay

## 2022-12-07 VITALS — BP 136/80 | HR 62 | Ht 73.0 in | Wt 206.0 lb

## 2022-12-07 DIAGNOSIS — R1084 Generalized abdominal pain: Secondary | ICD-10-CM | POA: Diagnosis not present

## 2022-12-07 DIAGNOSIS — M818 Other osteoporosis without current pathological fracture: Secondary | ICD-10-CM | POA: Diagnosis not present

## 2022-12-07 DIAGNOSIS — Z8719 Personal history of other diseases of the digestive system: Secondary | ICD-10-CM | POA: Diagnosis not present

## 2022-12-07 DIAGNOSIS — K5901 Slow transit constipation: Secondary | ICD-10-CM

## 2022-12-07 DIAGNOSIS — G8929 Other chronic pain: Secondary | ICD-10-CM | POA: Diagnosis not present

## 2022-12-07 DIAGNOSIS — K5904 Chronic idiopathic constipation: Secondary | ICD-10-CM

## 2022-12-07 MED ORDER — POTASSIUM CHLORIDE CRYS ER 20 MEQ PO TBCR
20.0000 meq | EXTENDED_RELEASE_TABLET | Freq: Every day | ORAL | 3 refills | Status: DC
  Filled 2022-12-07: qty 90, 90d supply, fill #0
  Filled 2023-02-19 – 2023-02-24 (×2): qty 90, 90d supply, fill #1
  Filled 2023-05-08: qty 90, 90d supply, fill #2
  Filled 2023-07-28: qty 90, 90d supply, fill #3

## 2022-12-07 MED ORDER — PREDNISONE 50 MG PO TABS
ORAL_TABLET | ORAL | 0 refills | Status: DC
  Filled 2022-12-07: qty 3, 1d supply, fill #0

## 2022-12-07 MED ORDER — DIPHENHYDRAMINE HCL 50 MG PO TABS
50.0000 mg | ORAL_TABLET | Freq: Every evening | ORAL | 0 refills | Status: DC | PRN
  Filled 2022-12-07: qty 1, 1d supply, fill #0

## 2022-12-07 NOTE — Progress Notes (Addendum)
Kevin Kass, MD    [id number]    1934/09/24  Primary Care Physician:Gupta, Freddie Breech, MD  Referring Physician: Mahlon Gammon, MD [address] [place],  [place] [address]   Chief complaint: Abdominal pain, weight loss, constipation  Discussed the use of AI scribe software for clinical note transcription with the patient, who gave verbal consent to proceed.  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 The patient reports infrequent meals, often skipping breakfast and lunch, and relies on protein shakes for nutrition. The patient also reports periods of feeling unwell with low-grade fever, which he associates with severe constipation. The patient's abdominal pain is constant and worsens after eating, leading to decreased food intake and subsequent weight loss. The patient also reports burping and nausea, which he believes is related to his constipation. Despite taking MiraLAX and Movantik, the patient only has bowel movements every other day, which he describes as hard and difficult to pass. The patient also reports drinking four 18-ounce bottles of water daily.    He has lost 30 to 40 pounds in the past year He was recently treated with antibiotics X2 within past 3 months  Clinical history includes MGUS, A-fib on Eliquis, s/p AVR, CAD, CHF, history of renal artery stenosis on Pletal  GI Hx:  CT abdomen pelvis November 03, 2022 without contrast 1. Minimal stranding in the proximal sigmoid colon compatible with acute diverticulitis. No free air or free fluid is present. 2. Cholelithiasis without evidence of cholecystitis. 3. Stable 5 cm simple cyst at the lower pole of the right kidney. No follow-up imaging is recommended. JACR 2018 Feb; 264-273, Management of the Incidental Renal Mass  on CT, [employer] 2021; 814-848, Bosniak Classification of Cystic Renal Masses, Version 2019. 4. Cardiomegaly without failure. 5. Coronary artery disease. 6.  Aortic Atherosclerosis (ICD10-I70.0).  CT abdomen pelvis without contrast September 30, 2022 1. There is mild bowel wall thickening and faint adjacent fat stranding at the confluence of the sigmoid colon and descending colon. This could reflect early acute uncomplicated diverticulitis. 2. Bladder wall is circumferentially thickened with a trabeculated appearance. This could reflect sequela of chronic outlet obstruction but given faint adjacent fat stranding, recommend correlation with urinalysis to exclude superimposed infection. 3. Massive prostatomegaly.  CT Abdomen Pelvis w contrast 08-12-21  1. No acute CT findings of the abdomen or pelvis to explain lower abdominal pain. 2. Descending and sigmoid diverticulosis without evidence of acute diverticulitis. 3. Severe prostatomegaly. 4. Tiny gallstones and or sludge in the gallbladder. No evidence of acute cholecystitis. 5. Coronary artery disease.   CT Abdomen Pelvis wo contrast 10-18-19 -Mild to moderate acute sigmoid diverticulitis. No evidence of abscess or other complication. -Stable enlarged prostate and findings of chronic bladder outlet obstruction.  -Cholelithiasis. No radiographic evidence of cholecystitis.   DG Abd 1 view 10-14-19 Moderate stool in the right colon, small volume of stool distally. No abnormal rectal distention. Normal bowel gas pattern without obstruction.     Outpatient Encounter Medications as of 12/07/2022  Medication Sig   amLODipine (NORVASC) 2.5 MG tablet Take 0.5 tablets (1.25 mg total) by mouth daily. (Patient taking differently: Take 1.25 mg by mouth as needed.)   apixaban (ELIQUIS) 5 MG TABS tablet Take 1 tablet (5 mg total) by mouth 2 (  two) times daily.   butalbital-acetaminophen-caffeine (BAC) 50-325-40 MG tablet Take 1 tablet by  mouth 2 (two) times daily as needed.   butalbital-acetaminophen-caffeine (BAC) 50-325-40 MG tablet Take 1 tablet by mouth 3 (three) times daily as needed.   cholecalciferol (VITAMIN D3) 25 MCG (1000 UNIT) tablet Take 1,000 Units by mouth daily.   cilostazol (PLETAL) 100 MG tablet Take 1 tablet (100 mg total) by mouth 2 (two) times daily.   Coenzyme Q10 200 MG TABS Take 200 mg by mouth daily.   desoximetasone (TOPICORT) 0.25 % cream Apply 1 Application topically 2 (two) times daily. (Patient taking differently: Apply 1 Application topically 2 (two) times daily as needed (dermatitis).)   diclofenac Sodium (VOLTAREN) 1 % GEL Apply 2 g topically 2 (two) times daily as needed (knee pain).   digoxin (LANOXIN) 0.125 MG tablet Take 1 tablet (0.125 mg total) by mouth daily.   diphenhydrAMINE (BENADRYL) 25 MG tablet Take 2 tablets (50 mg total) by mouth one hour prior to procedure (Patient taking differently: Take 50 mg by mouth as needed.)   diphenhydrAMINE (BENADRYL) 50 MG tablet Take 1 tablet (50 mg total) by mouth at bedtime as needed for itching. Use with CT scan. Instructions given.   doxazosin (CARDURA) 4 MG tablet Take 1 tablet (4 mg total) by mouth daily.   ezetimibe (ZETIA) 10 MG tablet Take 1 tablet (10 mg total) by mouth daily.   furosemide (LASIX) 20 MG tablet Take 20 mg by mouth daily as needed for fluid.   HYDROcodone-acetaminophen (NORCO) 10-325 MG tablet Take 1 tablet by mouth 2 (two) times daily as directed. (Patient taking differently: Take 1 tablet by mouth 3 (three) times daily.)   HYDROcodone-acetaminophen (NORCO) 10-325 MG tablet Take 1 tablet by mouth 3 (three) times daily as directed   HYDROcodone-acetaminophen (NORCO) 10-325 MG tablet Take 1 tablet by mouth 3 (three) times daily as directed.   lidocaine (SALONPAS PAIN RELIEVING) 4 % Place 2 patches onto the skin daily as needed (pain).   LORazepam (ATIVAN) 1 MG tablet Take 1 tablet (1 mg) by mouth at bedtime, and 0.5 tablet (0.5 mg)  daily as needed for anxiety.   magnesium oxide (MAG-OX) 400 MG tablet Take 1 tablet (400 mg total) by mouth daily.   Menthol-Methyl Salicylate (SALONPAS PAIN RELIEF PATCH EX) Apply 2 patches topically daily as needed (pain).   metoprolol succinate (TOPROL XL) 25 MG 24 hr tablet Take 3 tablets (75 mg total) by mouth 2 (two) times daily. (Patient taking differently: Take 50 mg by mouth 2 (two) times daily.)   midodrine (PROAMATINE) 2.5 MG tablet Take 1 tablet (2.5 mg total) by mouth 2 (two) times daily with a meal. (Patient taking differently: Take 2.5 mg by mouth as needed.)   Multiple Vitamins-Minerals (MULTIVITAMIN WITH MINERALS) tablet Take 1 tablet by mouth daily.   naloxegol oxalate (MOVANTIK) 12.5 MG TABS tablet Take 1 tablet (12.5 mg total) by mouth daily. Take 12.5 for 2 weeks if tolerated can increase to 25 mg daily.   nitroGLYCERIN (NITROSTAT) 0.4 MG SL tablet PLACE 1 TABLET UNDER THE TONGUE EVERY 5 MINUTES AS NEEDED FOR CHEST PAIN.   Omeprazole-Sodium Bicarbonate (ZEGERID PO) Take 1 capsule by mouth daily as needed (acid reflux).   pantoprazole (PROTONIX) 40 MG tablet Take 1 tablet (40 mg total) by mouth 2 (two) times daily. (Patient taking differently: Take 40 mg by mouth daily.)   Polyethyl Glycol-Propyl Glycol (SYSTANE OP) Place 1 drop into both eyes daily as needed (dry eyes).  polyethylene glycol (MIRALAX / GLYCOLAX) 17 g packet Take 17 g by mouth every other day.   predniSONE (DELTASONE) 5 MG tablet Take 1 tablet (5 mg total) by mouth daily with breakfast.   predniSONE (DELTASONE) 50 MG tablet For use with the CT scan. Instructions have been given.   rosuvastatin (CRESTOR) 20 MG tablet Take 1 tablet (20 mg total) by mouth at bedtime. Please keep scheduled appointment for future refills.   sennosides-docusate sodium (SENOKOT-S) 8.6-50 MG tablet Take 1 tablet by mouth 2 (two) times daily.   [DISCONTINUED] potassium chloride SA (KLOR-CON M) 20 MEQ tablet TAKE 1 TABLET BY MOUTH DAILY.    [DISCONTINUED] amoxicillin (AMOXIL) 500 MG capsule Take 4 capsules by mouth 1 hour prior to dental appointment. (Patient not taking: Reported on 12/07/2022)   [DISCONTINUED] amoxicillin-clavulanate (AUGMENTIN) 875-125 MG tablet Take 1 tablet by mouth 2 (two) times daily. (Patient not taking: Reported on 12/07/2022)   [DISCONTINUED] ciprofloxacin (CIPRO) 500 MG tablet Take 1 tablet (500 mg total) by mouth 2 (two) times daily. (Patient not taking: Reported on 12/07/2022)   [DISCONTINUED] metroNIDAZOLE (FLAGYL) 500 MG tablet Take 1 tablet (500 mg total) by mouth 3 (three) times daily. (Patient not taking: Reported on 12/07/2022)   [DISCONTINUED] pregabalin (LYRICA) 75 MG capsule Take 75 mg by mouth 2 (two) times daily.   No facility-administered encounter medications on file as of 12/07/2022.    Allergies as of 12/07/2022 - Review Complete 12/07/2022  Allergen Reaction Noted   Contrast media [iodinated contrast media] Hives 02/01/2011   Gadolinium derivatives Hives 10/09/2012   Iodine-131 Hives 10/07/2021   Metrizamide Hives 02/01/2011   Tetanus toxoids  10/07/2021    Past Medical History:  Diagnosis Date   Aortic stenosis    moderate aortic stenosis   Arthritis    Benign prostatic hypertrophy    Chronic systolic heart failure (HCC)    Chronotropic incompetence with sinus node dysfunction    Status post Guidant dual-mode, dual-pacing, dual-sensing  pacemaker   implantation now programmed to AAI with recent generator change.   Coronary artery disease    status post multiple prior percutaneous coronary interventions, microvascular angina per Dr [name]   Diverticulitis sigmoid colon recurrent    Dysfunctional autonomic nervous system    Dyspnea    Heart murmur    History of primary hypertension    Hypercoagulable state (HCC)    chronically anticoagulated with coumadin   Hyperlipidemia    Hyperthyroidism    Hypothyroidism    Dr. [name]   MGUS (monoclonal gammopathy of unknown  significance) 02/17/2013   Ocular myasthenia (HCC)    Osteoarthritis    Paroxysmal atrial fibrillation (HCC)    DR. Riley Kill,    Prediabetes 09/21/2017   Stroke (HCC)    1990    Past Surgical History:  Procedure Laterality Date   AORTIC VALVE REPLACEMENT  03/15/2011   Procedure: AORTIC VALVE REPLACEMENT (AVR);  Surgeon: Alleen Borne, MD;  Location: [facility] OR;  Service: Open Heart Surgery;  Laterality: N/A;   APPENDECTOMY     CARDIAC CATHETERIZATION     11   CARDIOVERSION     CARDIOVERSION  04/15/2011   Procedure: CARDIOVERSION;  Surgeon: Marca Ancona, MD;  Location: [facility] ENDOSCOPY;  Service: Cardiovascular;  Laterality: N/A;   CARDIOVERSION N/A 09/11/2014   Procedure: CARDIOVERSION;  Surgeon: Quintella Reichert, MD;  Location: [facility] ENDOSCOPY;  Service: Cardiovascular;  Laterality: N/A;   CARDIOVERSION N/A 06/27/2015   Procedure: CARDIOVERSION;  Surgeon: Vesta Mixer, MD;  Location:  [facility] ENDOSCOPY;  Service: Cardiovascular;  Laterality: N/A;   CARDIOVERSION N/A 07/04/2015   Procedure: CARDIOVERSION;  Surgeon: Marinus Maw, MD;  Location: [facility] ENDOSCOPY;  Service: Cardiovascular;  Laterality: N/A;   CARDIOVERSION N/A 04/13/2017   Procedure: CARDIOVERSION;  Surgeon: Quintella Reichert, MD;  Location: [facility] ENDOSCOPY;  Service: Cardiovascular;  Laterality: N/A;   COLONOSCOPY     CORONARY STENT INTERVENTION N/A 10/15/2019   Procedure: CORONARY STENT INTERVENTION;  Surgeon: Corky Crafts, MD;  Location: [facility] INVASIVE CV LAB;  Service: Cardiovascular;  Laterality: N/A;   EP IMPLANTABLE DEVICE N/A 06/16/2015   Procedure: Pacemaker Implant;  Surgeon: Marinus Maw, MD;  Location: [facility] INVASIVE CV LAB;  Service: Cardiovascular;  Laterality: N/A;   hemrrhoidectomy     LEFT AND RIGHT HEART CATHETERIZATION WITH CORONARY ANGIOGRAM Bilateral 02/01/2011   Procedure: LEFT AND RIGHT HEART CATHETERIZATION WITH CORONARY ANGIOGRAM;  Surgeon: Herby Abraham, MD;  Location: [facility] CATH LAB;  Service: Cardiovascular;   Laterality: Bilateral;   LEFT HEART CATH AND CORONARY ANGIOGRAPHY N/A 10/15/2019   Procedure: LEFT HEART CATH AND CORONARY ANGIOGRAPHY;  Surgeon: Corky Crafts, MD;  Location: [facility] INVASIVE CV LAB;  Service: Cardiovascular;  Laterality: N/A;   LEFT HEART CATH AND CORONARY ANGIOGRAPHY N/A 10/07/2020   Procedure: LEFT HEART CATH AND CORONARY ANGIOGRAPHY;  Surgeon: Tonny Bollman, MD;  Location: [facility] INVASIVE CV LAB;  Service: Cardiovascular;  Laterality: N/A;   LEFT HEART CATH AND CORONARY ANGIOGRAPHY N/A 07/06/2022   Procedure: LEFT HEART CATH AND CORONARY ANGIOGRAPHY;  Surgeon: Tonny Bollman, MD;  Location: [facility] INVASIVE CV LAB;  Service: Cardiovascular;  Laterality: N/A;   LUMBAR LAMINECTOMY/DECOMPRESSION MICRODISCECTOMY Right 02/23/2021   Procedure: Right Lumbar four-five microdiscectomy;  Surgeon: Tia Alert, MD;  Location: [facility] OR;  Service: Neurosurgery;  Laterality: Right;   MAZE  03/15/2011   Procedure: MAZE;  Surgeon: Alleen Borne, MD;  Location: [facility] OR;  Service: Open Heart Surgery;  Laterality: N/A;   MOHS SURGERY  09/2021   PACEMAKER INSERTION  1991   Guidant PPM, most recent Generator Change by Dr [name] was 08/22/06   RIGHT/LEFT HEART CATH AND CORONARY ANGIOGRAPHY N/A 07/06/2016   Procedure: Right/Left Heart Cath and Coronary Angiography;  Surgeon: Tonny Bollman, MD;  Location: [facility] INVASIVE CV LAB;  Service: Cardiovascular;  Laterality: N/A;   TEE WITHOUT CARDIOVERSION  04/15/2011   Procedure: TRANSESOPHAGEAL ECHOCARDIOGRAM (TEE);  Surgeon: Marca Ancona, MD;  Location: [facility] ENDOSCOPY;  Service: Cardiovascular;  Laterality: N/A;   TEE WITHOUT CARDIOVERSION N/A 09/11/2014   Procedure: TRANSESOPHAGEAL ECHOCARDIOGRAM (TEE);  Surgeon: Quintella Reichert, MD;  Location: [facility] ENDOSCOPY;  Service: Cardiovascular;  Laterality: N/A;    Family History  Problem Relation Age of Onset   Heart disease Brother        Twin brother has coronary disease and recent AVR for AS   CAD Brother     Atrial fibrillation Brother    Sarcoidosis Brother    Depression Daughter    Anorexia nervosa Daughter    Hypertension Son    Anesthesia problems Neg Hx    Hypotension Neg Hx    Malignant hyperthermia Neg Hx    Pseudochol deficiency Neg Hx     Social History   Socioeconomic History   Marital status: Married    Spouse name: Dondra Spry    Number of children: 3   Years of education: Not on file   Highest education level: Not on file  Occupational History   Occupation: Retired  Comment: Physician  Tobacco Use   Smoking status: Never   Smokeless tobacco: Never  Vaping Use   Vaping status: Never Used  Substance and Sexual Activity   Alcohol use: No    Alcohol/week: 0.0 standard drinks of alcohol   Drug use: No   Sexual activity: Not Currently  Other Topics Concern   Not on file  Social History Narrative   Married to [place]. Has grown children   Retired Proofreader MD      Never smoker no alcohol      Social Determinants of Corporate investment banker Strain: Not on file  Food Insecurity: Not on file  Transportation Needs: Not on file  Physical Activity: Not on file  Stress: Not on file  Social Connections: Not on file  Intimate Partner Violence: Not on file      Review of systems: All other review of systems negative except as mentioned in the HPI.   Physical Exam: Vitals:   12/07/22 1116  BP: 136/80  Pulse: 62  SpO2: 92%   Body mass index is 27.18 kg/m. 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 Pain with eating and weight loss. -Perform bowel cleanse/purge tomorrow with Plenvue. -Increase MiraLAX to three capfuls daily, not after 4 PM.  Advised him to try to do it mostly during daytime to avoid bowel urgency during bedtime -Continue Movantik. -Discontinue Dulcolax. -Eat or drink something every 2-3 hours, including protein shakes. -Avoid tough meats, prefer cooked and soft foods.  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 -Order CT angiogram abdomen pelvis with premed for contrast allergy to assess blood flow and rule out significant stenosis. -Consider vascular intervention if significant or critical stenosis is identified. He is on Pletal and chronic anticoagulated on Eliquis  Gallstones Asymptomatic gallstones identified on imaging. -No intervention planned at this time. Monitor for development of symptoms.  General Health Maintenance -Encouraged to maintain hydration with at least four 18-ounce bottles of water daily. -Consider use of Depends during aggressive bowel regimen.     Return in 2 to 4 weeks for follow-up  This visit required 40 minutes of patient care (this includes precharting, chart review, review of results, face-to-face time used for counseling as well as treatment plan and follow-up. The patient was provided an opportunity to ask questions and all were answered. The patient agreed with the plan and demonstrated an understanding of the instructions.  Iona Beard , MD    CC: Mahlon Gammon,  MD

## 2022-12-07 NOTE — Patient Instructions (Addendum)
VISIT SUMMARY:  During your visit, we discussed your ongoing issues with constipation, abdominal pain, and weight loss. We believe these symptoms may be related to your history of diverticular disease and gallstones. We also discussed the possibility of mesenteric ischemia, a condition where blood flow to the intestines is reduced, which can cause abdominal pain and weight loss.  YOUR PLAN:  -DIVERTICULAR DISEASE: Diverticular disease is a condition where small pouches form in the wall of the colon, which can cause constipation and pain. To manage this, we will increase your MiraLAX dosage to three capfuls daily, but not after 4 PM. We will also perform a bowel cleanse with Plenvue. You should continue taking Movantik and stop taking Dulcolax. Try to eat or drink something every 2-3 hours, including protein shakes, and avoid tough meats, preferring cooked and soft foods instead.  -SUSPECTED MESENTERIC ISCHEMIA: Mesenteric ischemia is a condition where the blood flow to the intestines is reduced, causing abdominal pain and weight loss. To investigate this, we will order a CT angiogram to assess blood flow and rule out significant narrowing of the blood vessels. If significant narrowing is identified, we may consider a vascular intervention. Eat small frequent meals every 2-3 hours  -GALLSTONES: Gallstones are hard deposits in your gallbladder that can cause pain. However, your gallstones are currently not causing any symptoms, so we do not plan any intervention at this time. We will monitor for the development of symptoms.   INSTRUCTIONS:  For your general health, we encourage you to maintain hydration by drinking at least four 18-ounce bottles of water daily. You may also want to consider using Depends during your aggressive bowel regimen. Please follow the instructions for your increased MiraLAX dosage and bowel cleanse with Plenvue. We will be in touch about scheduling your CT angiogram.  Your  records indicate that you have an allergy/sensitivity to one or more components within IV contrast dye. We have sent a prescription of Prednisone 50 mg (3 tablets) and Benadryl 50 mg (1 tablet) to your pharmacy as a pre-mediation for your contrasted procedure which should prevent any reaction from occurring.  Dr Lavon Paganini recommends that you complete a bowel purge (to clean out your bowels). Please do the following: Follow all of the instructions on dose 1 on the prep box     You have a CT Angio Abdomen/Pelvis scheduled on 12/13/2022 at 9 am at [facility] Arrive at 8:45 am  Take (1) 50 mg tablet of prednisone 13 hours prior to your procedure at 8 pm 10/13.   Take (1) 50 mg tablet of prednisone 7 hours prior to your procedure at 2 am.    Take (1) 50 mg tablet of prednisone and (1) 50 mg tablet of Benadryl 1 hour prior to your procedure at 8 am .    I appreciate the  opportunity to care for you  Thank You   Marsa Aris , MD

## 2022-12-08 ENCOUNTER — Other Ambulatory Visit: Payer: Self-pay

## 2022-12-08 ENCOUNTER — Encounter: Payer: Self-pay | Admitting: Gastroenterology

## 2022-12-08 ENCOUNTER — Other Ambulatory Visit (HOSPITAL_BASED_OUTPATIENT_CLINIC_OR_DEPARTMENT_OTHER): Payer: Self-pay

## 2022-12-08 MED ORDER — NALOXEGOL OXALATE 25 MG PO TABS
25.0000 mg | ORAL_TABLET | Freq: Every day | ORAL | 6 refills | Status: DC
  Filled 2022-12-08: qty 30, 30d supply, fill #0

## 2022-12-08 MED ORDER — NALOXEGOL OXALATE 25 MG PO TABS
25.0000 mg | ORAL_TABLET | Freq: Every day | ORAL | 3 refills | Status: DC
  Filled 2022-12-08: qty 90, 90d supply, fill #0

## 2022-12-08 NOTE — Addendum Note (Signed)
Addended by: Heber White Cloud A on: 12/08/2022 09:34 AM   Modules accepted: Orders

## 2022-12-13 ENCOUNTER — Other Ambulatory Visit (HOSPITAL_BASED_OUTPATIENT_CLINIC_OR_DEPARTMENT_OTHER): Payer: Self-pay

## 2022-12-13 ENCOUNTER — Ambulatory Visit (HOSPITAL_COMMUNITY)
Admission: RE | Admit: 2022-12-13 | Discharge: 2022-12-13 | Disposition: A | Discharge status: Home / Self Care | Payer: Medicare Other | Source: Ambulatory Visit | Attending: Gastroenterology | Admitting: Gastroenterology

## 2022-12-13 ENCOUNTER — Other Ambulatory Visit: Payer: Self-pay

## 2022-12-13 DIAGNOSIS — N400 Enlarged prostate without lower urinary tract symptoms: Secondary | ICD-10-CM | POA: Diagnosis not present

## 2022-12-13 DIAGNOSIS — K5901 Slow transit constipation: Secondary | ICD-10-CM | POA: Diagnosis not present

## 2022-12-13 DIAGNOSIS — K5904 Chronic idiopathic constipation: Secondary | ICD-10-CM | POA: Insufficient documentation

## 2022-12-13 DIAGNOSIS — R1084 Generalized abdominal pain: Secondary | ICD-10-CM | POA: Diagnosis not present

## 2022-12-13 DIAGNOSIS — M48062 Spinal stenosis, lumbar region with neurogenic claudication: Secondary | ICD-10-CM | POA: Diagnosis not present

## 2022-12-13 DIAGNOSIS — Z79891 Long term (current) use of opiate analgesic: Secondary | ICD-10-CM | POA: Diagnosis not present

## 2022-12-13 DIAGNOSIS — G894 Chronic pain syndrome: Secondary | ICD-10-CM | POA: Diagnosis not present

## 2022-12-13 DIAGNOSIS — N281 Cyst of kidney, acquired: Secondary | ICD-10-CM | POA: Diagnosis not present

## 2022-12-13 DIAGNOSIS — M47812 Spondylosis without myelopathy or radiculopathy, cervical region: Secondary | ICD-10-CM | POA: Diagnosis not present

## 2022-12-13 DIAGNOSIS — G8929 Other chronic pain: Secondary | ICD-10-CM | POA: Insufficient documentation

## 2022-12-13 DIAGNOSIS — Z8719 Personal history of other diseases of the digestive system: Secondary | ICD-10-CM | POA: Diagnosis not present

## 2022-12-13 DIAGNOSIS — R109 Unspecified abdominal pain: Secondary | ICD-10-CM | POA: Diagnosis not present

## 2022-12-13 DIAGNOSIS — K5790 Diverticulosis of intestine, part unspecified, without perforation or abscess without bleeding: Secondary | ICD-10-CM | POA: Diagnosis not present

## 2022-12-13 MED ORDER — IOHEXOL 350 MG/ML SOLN
75.0000 mL | Freq: Once | INTRAVENOUS | Status: AC | PRN
  Administered 2022-12-13: 75 mL via INTRAVENOUS

## 2022-12-13 MED ORDER — BUTALBITAL-APAP-CAFFEINE 50-325-40 MG PO TABS
1.0000 | ORAL_TABLET | Freq: Three times a day (TID) | ORAL | 2 refills | Status: DC
  Filled 2022-12-13 – 2022-12-14 (×2): qty 90, 30d supply, fill #0
  Filled 2023-01-19: qty 90, 30d supply, fill #1

## 2022-12-13 MED ORDER — HYDROCODONE-ACETAMINOPHEN 10-325 MG PO TABS
1.0000 | ORAL_TABLET | Freq: Three times a day (TID) | ORAL | 0 refills | Status: DC
  Filled 2022-12-13 – 2022-12-16 (×2): qty 90, 30d supply, fill #0

## 2022-12-14 ENCOUNTER — Other Ambulatory Visit: Payer: Self-pay

## 2022-12-14 ENCOUNTER — Other Ambulatory Visit (HOSPITAL_BASED_OUTPATIENT_CLINIC_OR_DEPARTMENT_OTHER): Payer: Self-pay

## 2022-12-15 NOTE — Progress Notes (Signed)
Kevin Kass, MD    [id number]    Nov 03, 1934  Primary Care Physician:Gupta, Freddie Breech, MD  Referring Physician: Mahlon Gammon, MD [address] 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 He is a patient of Dr. Leone Payor.   Last seen on 12/07/2022.  Accompanied by Selena Batten today.  He states that his LLQ pain has somewhat improved since his last office visit but he continues to experience some discomfort and bloating. He reports typically having a BM once every other day despite using Miralax once daily. He reports occasionally experiencing accompanying chest pain that radiates to his arms. We also reviewed his recent CT scans and discussed the results and his diagnosis of diverticulitis. He states that he has an adequate daily water intake.   Patient denies any blood in stool, black stool, vomiting,  unintentional weight loss, reflux, or dysphagia.   GI Hx:  Clinical history includes MGUS, A-fib on Eliquis, s/p AVR, CAD, CHF, history of renal artery stenosis on Pletal  CT Angio Abdomen Pelvis w/wo contrast 12-13-22 -VASCULAR Atherosclerosis of abdominal aorta is noted without aneurysm or dissection. No significant mesenteric or renal artery stenosis is noted. Aortic Atherosclerosis (ICD10-I70.0). -NON-VASCULAR Diverticulosis of descending and sigmoid colon is noted. Minimal inflammatory changes are noted involving the proximal sigmoid colon which were present on prior exam and may represent chronic sequela of previous inflammation, although acute inflammation  cannot be excluded.   Stable moderate prostatic enlargement.  CT abdomen pelvis November 05, 2022 without contrast 1. Minimal stranding in the proximal sigmoid colon compatible with acute diverticulitis. No free air or free fluid is present. 2. Cholelithiasis without evidence of cholecystitis. 3. Stable 5 cm simple cyst at the lower pole of the right kidney. No follow-up imaging is recommended. JACR 2018 Feb; 264-273, Management of the Incidental Renal Mass on CT, [employer] 2021; 814-848, Bosniak Classification of Cystic Renal Masses, Version 2019. 4. Cardiomegaly without failure. 5. Coronary artery disease. 6.  Aortic Atherosclerosis (ICD10-I70.0).  CT abdomen pelvis without contrast September 30, 2022 1. There is mild bowel wall thickening and faint adjacent fat stranding at the confluence of the sigmoid colon and descending colon. This could reflect early acute uncomplicated diverticulitis. 2. Bladder wall is circumferentially thickened with a trabeculated appearance. This could reflect sequela of chronic outlet obstruction but given faint adjacent fat stranding, recommend correlation with urinalysis to exclude superimposed infection. 3. Massive prostatomegaly.  CT Abdomen Pelvis w contrast 08-12-21  1. No acute CT findings of the abdomen or pelvis to explain lower abdominal pain. 2. Descending and sigmoid diverticulosis without evidence of acute diverticulitis. 3. Severe prostatomegaly. 4. Tiny gallstones and or sludge in the gallbladder. No evidence of acute cholecystitis. 5. Coronary artery disease.   CT Abdomen Pelvis wo contrast 10-18-19 -Mild to moderate acute sigmoid diverticulitis. No evidence of abscess or other complication. -Stable enlarged prostate and findings of chronic bladder outlet obstruction.  -Cholelithiasis. No radiographic evidence of cholecystitis.   DG Abd 1  view 10-14-19 Moderate stool in the right colon, small volume of stool distally. No  abnormal rectal distention. Normal bowel gas pattern without obstruction.     Current Outpatient Medications:    amLODipine (NORVASC) 2.5 MG tablet, Take 0.5 tablets (1.25 mg total) by mouth daily. (Patient taking differently: Take 1.25 mg by mouth as needed.), Disp: 45 tablet, Rfl: 3   apixaban (ELIQUIS) 5 MG TABS tablet, Take 1 tablet (5 mg total) by mouth 2 (two) times daily., Disp: 180 tablet, Rfl: 1   butalbital-acetaminophen-caffeine (BAC) 50-325-40 MG tablet, Take 1 tablet by mouth 3 (three) times daily., Disp: 90 tablet, Rfl: 2   cholecalciferol (VITAMIN D3) 25 MCG (1000 UNIT) tablet, Take 1,000 Units by mouth daily., Disp: , Rfl:    cilostazol (PLETAL) 100 MG tablet, Take 1 tablet (100 mg total) by mouth 2 (two) times daily., Disp: 180 tablet, Rfl: 3   Coenzyme Q10 200 MG TABS, Take 200 mg by mouth daily., Disp: , Rfl:    desoximetasone (TOPICORT) 0.25 % cream, Apply 1 Application topically 2 (two) times daily. (Patient taking differently: Apply 1 Application topically 2 (two) times daily as needed (dermatitis).), Disp: 60 g, Rfl: 0   diclofenac Sodium (VOLTAREN) 1 % GEL, Apply 2 g topically 2 (two) times daily as needed (knee pain)., Disp: , Rfl:    digoxin (LANOXIN) 0.125 MG tablet, Take 1 tablet (0.125 mg total) by mouth daily., Disp: 90 tablet, Rfl: 1   diphenhydrAMINE (BENADRYL) 25 MG tablet, Take 2 tablets (50 mg total) by mouth one hour prior to procedure (Patient taking differently: Take 50 mg by mouth as needed.), Disp: 30 tablet, Rfl: 0   diphenhydrAMINE (BENADRYL) 50 MG tablet, Take 1 tablet (50 mg total) by mouth at bedtime as needed for itching. Use with CT scan. Instructions given., Disp: 1 tablet, Rfl: 0   doxazosin (CARDURA) 4 MG tablet, Take 1 tablet (4 mg total) by mouth daily., Disp: 90 tablet, Rfl: 3   ezetimibe (ZETIA) 10 MG tablet, Take 1 tablet (10 mg total) by mouth daily., Disp: 90 tablet, Rfl: 3   furosemide (LASIX) 20 MG tablet, Take 20 mg by mouth daily as  needed for fluid., Disp: , Rfl:    HYDROcodone-acetaminophen (NORCO) 10-325 MG tablet, Take 1 tablet by mouth 3 (three) times daily as directed, Disp: 90 tablet, Rfl: 0   lidocaine (SALONPAS PAIN RELIEVING) 4 %, Place 2 patches onto the skin daily as needed (pain)., Disp: , Rfl:    LORazepam (ATIVAN) 1 MG tablet, Take 1 tablet (1 mg) by mouth at bedtime, and 0.5 tablet (0.5 mg) daily as needed for anxiety., Disp: 60 tablet, Rfl: 5   magnesium oxide (MAG-OX) 400 MG tablet, Take 1 tablet (400 mg total) by mouth daily., Disp: 120 tablet, Rfl: 1   Menthol-Methyl Salicylate (SALONPAS PAIN RELIEF PATCH EX), Apply 2 patches topically daily as needed (pain)., Disp: , Rfl:    metoprolol succinate (TOPROL XL) 25 MG 24 hr tablet, Take 3 tablets (75 mg total) by mouth 2 (two) times daily. (Patient taking differently: Take 50 mg by mouth 2 (two) times daily.), Disp: 540 tablet, Rfl: 3   midodrine (PROAMATINE) 2.5 MG tablet, Take 1 tablet (2.5 mg total) by mouth 2 (two) times daily with a meal. (Patient taking differently: Take 2.5 mg by mouth as needed.), Disp: 60 tablet, Rfl: 3   Multiple Vitamins-Minerals (MULTIVITAMIN WITH MINERALS) tablet, Take 1 tablet by mouth daily., Disp: , Rfl:    naloxegol oxalate (MOVANTIK) 25  MG TABS tablet, Take 1 tablet (25 mg total) by mouth daily. Take 12.5 for 2 weeks if tolerated can increase to 25 mg daily., Disp: 90 tablet, Rfl: 3   nitroGLYCERIN (NITROSTAT) 0.4 MG SL tablet, Place 1 tablet (0.4 mg total) under the tongue every 5 (five) minutes as needed for chest pain., Disp: 25 tablet, Rfl: 5   Omeprazole-Sodium Bicarbonate (ZEGERID PO), Take 1 capsule by mouth daily as needed (acid reflux)., Disp: , Rfl:    pantoprazole (PROTONIX) 40 MG tablet, Take 1 tablet (40 mg total) by mouth 2 (two) times daily. (Patient taking differently: Take 40 mg by mouth daily.), Disp: 180 tablet, Rfl: 3   Polyethyl Glycol-Propyl Glycol (SYSTANE OP), Place 1 drop into both eyes daily as needed (dry  eyes)., Disp: , Rfl:    polyethylene glycol (MIRALAX / GLYCOLAX) 17 g packet, Take 17 g by mouth every other day., Disp: , Rfl:    potassium chloride SA (KLOR-CON M) 20 MEQ tablet, Take 1 tablet (20 mEq total) by mouth daily., Disp: 90 tablet, Rfl: 3   predniSONE (DELTASONE) 50 MG tablet, For use with the CT scan. Instructions have been given., Disp: 3 tablet, Rfl: 0   rosuvastatin (CRESTOR) 20 MG tablet, Take 1 tablet (20 mg total) by mouth at bedtime. Please keep scheduled appointment for future refills., Disp: 90 tablet, Rfl: 3   sennosides-docusate sodium (SENOKOT-S) 8.6-50 MG tablet, Take 1 tablet by mouth 2 (two) times daily., Disp: , Rfl:    predniSONE (DELTASONE) 5 MG tablet, 1 tablet Orally Once a day, Disp: , Rfl:     Allergies as of 12/17/2022 - Review Complete 12/17/2022  Allergen Reaction Noted   Contrast media [iodinated contrast media] Hives 02/01/2011   Gadolinium derivatives Hives 10/09/2012   Iodine-131 Hives 10/07/2021   Metrizamide Hives 02/01/2011   Tetanus toxoids  10/07/2021    Past Medical History:  Diagnosis Date   Aortic stenosis    moderate aortic stenosis   Arthritis    Benign prostatic hypertrophy    Chronic systolic heart failure (HCC)    Chronotropic incompetence with sinus node dysfunction    Status post Guidant dual-mode, dual-pacing, dual-sensing  pacemaker   implantation now programmed to AAI with recent generator change.   Coronary artery disease    status post multiple prior percutaneous coronary interventions, microvascular angina per Dr [name]   Diverticulitis sigmoid colon recurrent    Dysfunctional autonomic nervous system    Dyspnea    Heart murmur    History of primary hypertension    Hypercoagulable state (HCC)    chronically anticoagulated with coumadin   Hyperlipidemia    Hyperthyroidism    Hypothyroidism    Dr. [name]   MGUS (monoclonal gammopathy of unknown significance) 02/17/2013   Ocular myasthenia (HCC)     Osteoarthritis    Paroxysmal atrial fibrillation (HCC)    DR. Riley Kill,    Prediabetes 09/21/2017   Stroke (HCC)    1990    Past Surgical History:  Procedure Laterality Date   AORTIC VALVE REPLACEMENT  03/15/2011   Procedure: AORTIC VALVE REPLACEMENT (AVR);  Surgeon: Alleen Borne, MD;  Location: [facility] OR;  Service: Open Heart Surgery;  Laterality: N/A;   APPENDECTOMY     CARDIAC CATHETERIZATION     11   CARDIOVERSION     CARDIOVERSION  04/15/2011   Procedure: CARDIOVERSION;  Surgeon: Marca Ancona, MD;  Location: [facility] ENDOSCOPY;  Service: Cardiovascular;  Laterality: N/A;   CARDIOVERSION N/A 09/11/2014   Procedure:  CARDIOVERSION;  Surgeon: Quintella Reichert, MD;  Location: [facility] ENDOSCOPY;  Service: Cardiovascular;  Laterality: N/A;   CARDIOVERSION N/A 06/27/2015   Procedure: CARDIOVERSION;  Surgeon: Vesta Mixer, MD;  Location: [facility] ENDOSCOPY;  Service: Cardiovascular;  Laterality: N/A;   CARDIOVERSION N/A 07/04/2015   Procedure: CARDIOVERSION;  Surgeon: Marinus Maw, MD;  Location: [facility] ENDOSCOPY;  Service: Cardiovascular;  Laterality: N/A;   CARDIOVERSION N/A 04/13/2017   Procedure: CARDIOVERSION;  Surgeon: Quintella Reichert, MD;  Location: [facility] ENDOSCOPY;  Service: Cardiovascular;  Laterality: N/A;   COLONOSCOPY     CORONARY STENT INTERVENTION N/A 10/15/2019   Procedure: CORONARY STENT INTERVENTION;  Surgeon: Corky Crafts, MD;  Location: [facility] INVASIVE CV LAB;  Service: Cardiovascular;  Laterality: N/A;   EP IMPLANTABLE DEVICE N/A 06/16/2015   Procedure: Pacemaker Implant;  Surgeon: Marinus Maw, MD;  Location: [facility] INVASIVE CV LAB;  Service: Cardiovascular;  Laterality: N/A;   hemrrhoidectomy     LEFT AND RIGHT HEART CATHETERIZATION WITH CORONARY ANGIOGRAM Bilateral 02/01/2011   Procedure: LEFT AND RIGHT HEART CATHETERIZATION WITH CORONARY ANGIOGRAM;  Surgeon: Herby Abraham, MD;  Location: [facility] CATH LAB;  Service: Cardiovascular;  Laterality: Bilateral;   LEFT HEART CATH AND CORONARY  ANGIOGRAPHY N/A 10/15/2019   Procedure: LEFT HEART CATH AND CORONARY ANGIOGRAPHY;  Surgeon: Corky Crafts, MD;  Location: [facility] INVASIVE CV LAB;  Service: Cardiovascular;  Laterality: N/A;   LEFT HEART CATH AND CORONARY ANGIOGRAPHY N/A 10/07/2020   Procedure: LEFT HEART CATH AND CORONARY ANGIOGRAPHY;  Surgeon: Tonny Bollman, MD;  Location: [facility] INVASIVE CV LAB;  Service: Cardiovascular;  Laterality: N/A;   LEFT HEART CATH AND CORONARY ANGIOGRAPHY N/A 07/06/2022   Procedure: LEFT HEART CATH AND CORONARY ANGIOGRAPHY;  Surgeon: Tonny Bollman, MD;  Location: [facility] INVASIVE CV LAB;  Service: Cardiovascular;  Laterality: N/A;   LUMBAR LAMINECTOMY/DECOMPRESSION MICRODISCECTOMY Right 02/23/2021   Procedure: Right Lumbar four-five microdiscectomy;  Surgeon: Tia Alert, MD;  Location: [facility] OR;  Service: Neurosurgery;  Laterality: Right;   MAZE  03/15/2011   Procedure: MAZE;  Surgeon: Alleen Borne, MD;  Location: [facility] OR;  Service: Open Heart Surgery;  Laterality: N/A;   MOHS SURGERY  09/2021   PACEMAKER INSERTION  1991   Guidant PPM, most recent Generator Change by Dr [name] was 08/22/06   RIGHT/LEFT HEART CATH AND CORONARY ANGIOGRAPHY N/A 07/06/2016   Procedure: Right/Left Heart Cath and Coronary Angiography;  Surgeon: Tonny Bollman, MD;  Location: [facility] INVASIVE CV LAB;  Service: Cardiovascular;  Laterality: N/A;   TEE WITHOUT CARDIOVERSION  04/15/2011   Procedure: TRANSESOPHAGEAL ECHOCARDIOGRAM (TEE);  Surgeon: Marca Ancona, MD;  Location: [facility] ENDOSCOPY;  Service: Cardiovascular;  Laterality: N/A;   TEE WITHOUT CARDIOVERSION N/A 09/11/2014   Procedure: TRANSESOPHAGEAL ECHOCARDIOGRAM (TEE);  Surgeon: Quintella Reichert, MD;  Location: [facility] ENDOSCOPY;  Service: Cardiovascular;  Laterality: N/A;    Family History  Problem Relation Age of Onset   Heart disease Brother        Twin brother has coronary disease and recent AVR for AS   CAD Brother    Atrial fibrillation Brother    Sarcoidosis Brother     Depression Daughter    Anorexia nervosa Daughter    Hypertension Son    Anesthesia problems Neg Hx    Hypotension Neg Hx    Malignant hyperthermia Neg Hx    Pseudochol deficiency Neg Hx     Social History   Socioeconomic History   Marital status: Married    Spouse name:  Dondra Spry    Number of children: 3   Years of education: Not on file   Highest education level: Not on file  Occupational History   Occupation: Retired    Comment: Physician  Tobacco Use   Smoking status: Never   Smokeless tobacco: Never  Vaping Use   Vaping status: Never Used  Substance and Sexual Activity   Alcohol use: No    Alcohol/week: 0.0 standard drinks of alcohol   Drug use: No   Sexual activity: Not Currently  Other Topics Concern   Not on file  Social History Narrative   Married to [place]. Has grown children   Retired Proofreader MD      Never smoker no alcohol      Social Determinants of Corporate investment banker Strain: Not on file  Food Insecurity: Not on file  Transportation Needs: Not on file  Physical Activity: Not on file  Stress: Not on file  Social Connections: Not on file  Intimate Partner Violence: Not on file    Review of systems: Review of Systems  Constitutional:  Negative for unexpected weight change.  HENT:  Negative for trouble swallowing.   Gastrointestinal:  Positive for abdominal distention, abdominal pain and constipation. Negative for anal bleeding, blood in stool, diarrhea, nausea, rectal pain and vomiting.    Physical Exam: Vitals:   12/17/22 1419  BP: 114/80  Pulse: 75  SpO2: 95%    Body mass index is 27.18 kg/m. General: well-appearing   Eyes: sclera anicteric, no redness ENT: oral mucosa moist without lesions, no cervical or supraclavicular lymphadenopathy CV: s1s2, no JVD, + peripheral edema Resp: clear to auscultation bilaterally, normal RR and effort noted GI: soft, no tenderness, with active bowel sounds. No guarding or palpable organomegaly  noted. Skin; warm and dry, no rash or jaundice noted Neuro: awake, alert and oriented x 3. 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 Patient reports improvement s/p bowel purge with Plenvu (bowel prep kit) but still only having bowel movements every other day. Discussed the impact of diet, physical activity, and medications such as codeine on bowel function. Noted the presence of diverticular disease which may be exacerbated by constipation. -Discontinue Miralax. -Start Movantik 25mg  daily in the evening. -Start Linzess 1 capsule daily in the morning. -Encourage increased fluid intake, aiming for at least 60 ounces daily. -Advise small, frequent meals and avoidance of artificial sweeteners. -Check response to new regimen in 1 month.  Enlarged Prostate Patient reports seeing a urologist who noted significant prostate enlargement. No current plans for surgical intervention. -Continue current management as directed by urologist.  Monoclonal Gammopathy of Undetermined Significance (MGUS) Patient reports ongoing monitoring with hematologist. No current treatment as IgM levels remain under 3000. -Continue current monitoring and follow-up as directed by hematologist.   Gallstones Asymptomatic gallstones identified on imaging. -No  intervention planned at this time. Monitor for development of symptoms.     Return in 4 weeks for follow-up  This visit required 30 minutes of patient care (this includes precharting, chart review, review of results, face-to-face time used for counseling as well as treatment plan and follow-up. The patient was provided an opportunity to ask questions and all were answered. The patient agreed with the plan and demonstrated an understanding of the instructions.  Iona Beard , MD  Ladona Mow Hewitt [employer]orts as a scribe for Marsa Aris, MD.,have documented all relevant documentation on the behalf of Marsa Aris, MD,as directed by  Marsa Aris, MD while in the presence of Marsa Aris, MD.   I, Marsa Aris, MD, have reviewed all documentation for this visit. The documentation on 12/17/22 for the exam, diagnosis, procedures, and orders are all accurate and complete.    CC: Mahlon Gammon, MD

## 2022-12-16 ENCOUNTER — Other Ambulatory Visit (HOSPITAL_BASED_OUTPATIENT_CLINIC_OR_DEPARTMENT_OTHER): Payer: Self-pay

## 2022-12-16 ENCOUNTER — Other Ambulatory Visit: Payer: Self-pay

## 2022-12-17 ENCOUNTER — Other Ambulatory Visit (HOSPITAL_BASED_OUTPATIENT_CLINIC_OR_DEPARTMENT_OTHER): Payer: Self-pay

## 2022-12-17 ENCOUNTER — Ambulatory Visit (INDEPENDENT_AMBULATORY_CARE_PROVIDER_SITE_OTHER): Payer: Medicare Other | Admitting: Gastroenterology

## 2022-12-17 ENCOUNTER — Encounter: Payer: Self-pay | Admitting: Gastroenterology

## 2022-12-17 VITALS — BP 114/80 | HR 75 | Ht 73.0 in | Wt 206.0 lb

## 2022-12-17 DIAGNOSIS — N400 Enlarged prostate without lower urinary tract symptoms: Secondary | ICD-10-CM

## 2022-12-17 DIAGNOSIS — K5904 Chronic idiopathic constipation: Secondary | ICD-10-CM | POA: Diagnosis not present

## 2022-12-17 DIAGNOSIS — T402X5A Adverse effect of other opioids, initial encounter: Secondary | ICD-10-CM

## 2022-12-17 DIAGNOSIS — K5903 Drug induced constipation: Secondary | ICD-10-CM

## 2022-12-17 DIAGNOSIS — D472 Monoclonal gammopathy: Secondary | ICD-10-CM | POA: Diagnosis not present

## 2022-12-17 DIAGNOSIS — K5732 Diverticulitis of large intestine without perforation or abscess without bleeding: Secondary | ICD-10-CM | POA: Diagnosis not present

## 2022-12-17 MED ORDER — LINACLOTIDE 290 MCG PO CAPS
290.0000 ug | ORAL_CAPSULE | Freq: Every day | ORAL | 3 refills | Status: DC
  Filled 2022-12-17: qty 90, 90d supply, fill #0
  Filled 2022-12-27: qty 90, 90d supply, fill #1

## 2022-12-17 NOTE — Patient Instructions (Addendum)
VISIT SUMMARY:  During your visit, we discussed your ongoing issues with chronic constipation, enlarged prostate, and Monoclonal Gammopathy of Undetermined Significance (MGUS). We noted some improvement in your constipation symptoms with the use of hypertonic solutions and Miralax, but you still experience discomfort and infrequent bowel movements. We also discussed your lifestyle and diet, which may be contributing to your symptoms.  YOUR PLAN:  -CHRONIC CONSTIPATION: Chronic constipation is a condition where you have infrequent bowel movements or difficult passage of stools that persists for several weeks or longer. We will discontinue Miralax and start you on Movantik 25mg  daily in the evening and Linzess 1 capsule daily in the morning. We also recommend increasing your fluid intake to at least 60 ounces daily and eating small, frequent meals while avoiding artificial sweeteners. We will check your response to this new regimen in 1 month.   INSTRUCTIONS:  Please start taking Movantik 25mg  daily in the evening and Linzess 1 capsule daily in the morning. Increase your fluid intake to at least 60 ounces daily and try to eat small, frequent meals while avoiding artificial sweeteners. Continue with the current management for your enlarged prostate and MGUS as directed by your urologist and hematologist respectively. We will check your response to the new regimen for constipation in 1 month.  I appreciate the  opportunity to care for you  Thank You   Marsa Aris , MD

## 2022-12-20 NOTE — Progress Notes (Signed)
Remote pacemaker transmission.   

## 2022-12-21 ENCOUNTER — Encounter: Payer: Self-pay | Admitting: Gastroenterology

## 2022-12-24 ENCOUNTER — Other Ambulatory Visit: Payer: Self-pay | Admitting: Cardiovascular Disease

## 2022-12-24 ENCOUNTER — Other Ambulatory Visit (HOSPITAL_BASED_OUTPATIENT_CLINIC_OR_DEPARTMENT_OTHER): Payer: Self-pay

## 2022-12-24 NOTE — Telephone Encounter (Signed)
Pt's pharmacy is requesting a refill on diphenhydramine. Would Dr. Excell Seltzer like to refill thi non cardiac medication? Please address

## 2022-12-25 IMAGING — CT CT ABD-PELV W/ CM
2 of 5 series · 16 of 46 positions shown, 18 images · IV contrast (APPLIED)
Comparison: 10/18/2019

CLINICAL DATA: Lower abdominal pain, rule out diverticulitis
history of appendectomy

EXAM:
CT ABDOMEN AND PELVIS WITH CONTRAST
TECHNIQUE: Multidetector CT imaging of the abdomen and pelvis was performed
using the standard protocol following bolus administration of
intravenous contrast.

[Series 2: abd pel w · axial · 0.89mm/px · z∈[+878,+1318]mm · 13 of 100 slices shown, 15 images]
[im 6/100  soft-tissue]
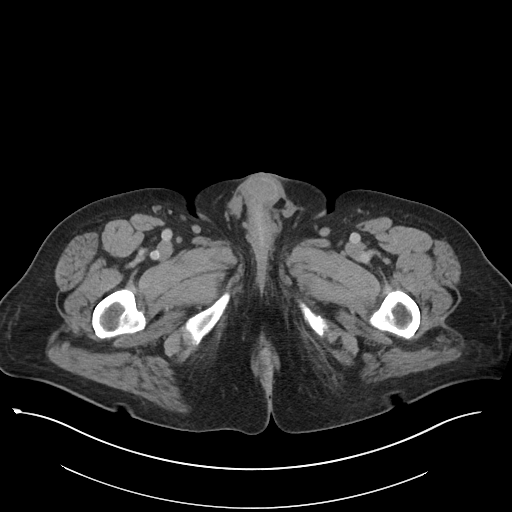
[im 6/100  bone]
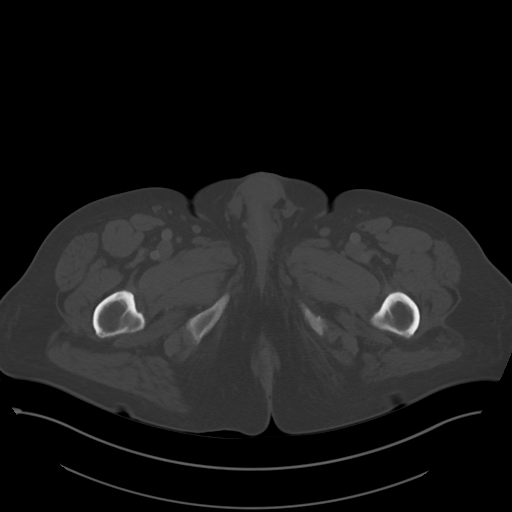
[im 16/100  soft-tissue]
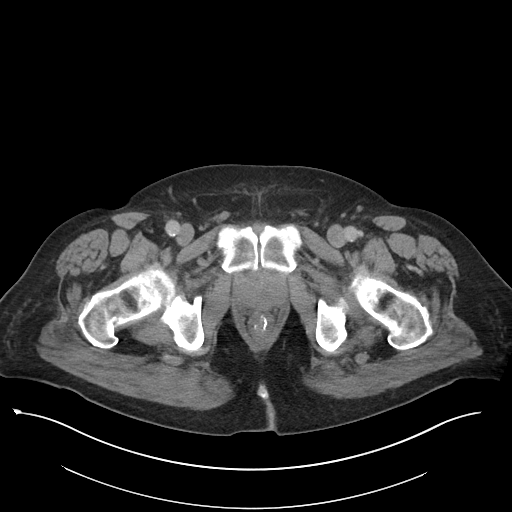
[im 21/100  soft-tissue]
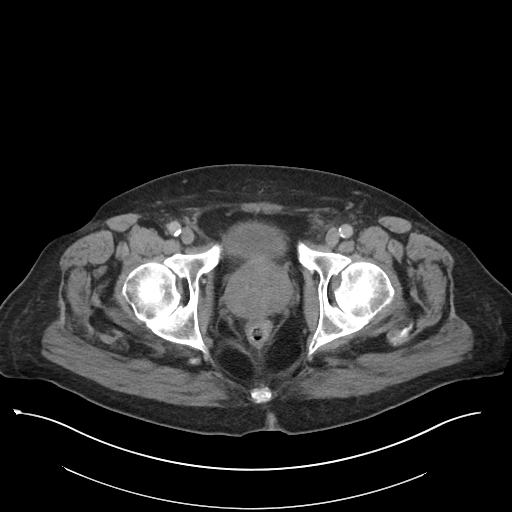
[im 27/100  soft-tissue]
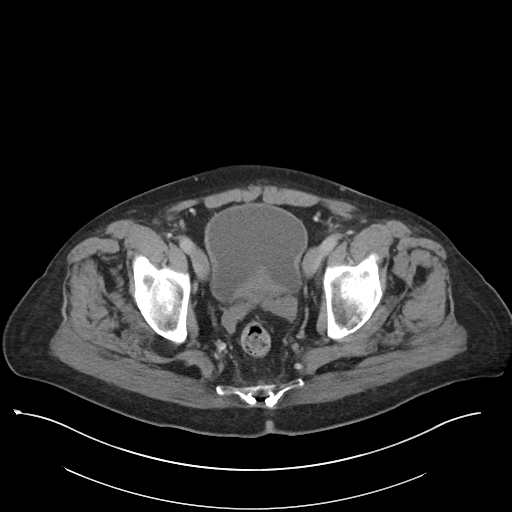
[im 37/100  soft-tissue]
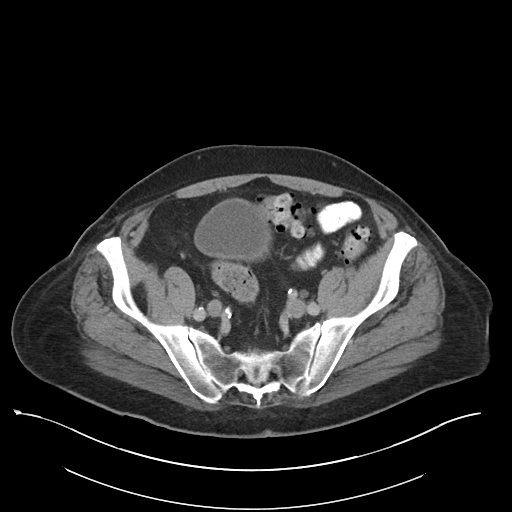
[im 42/100  soft-tissue]
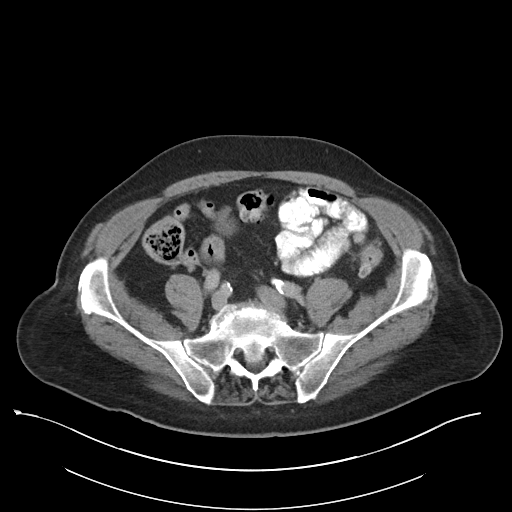
[im 53/100  soft-tissue]
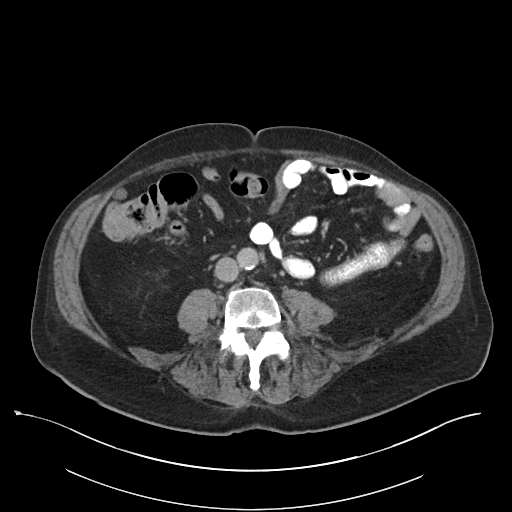
[im 58/100  soft-tissue]
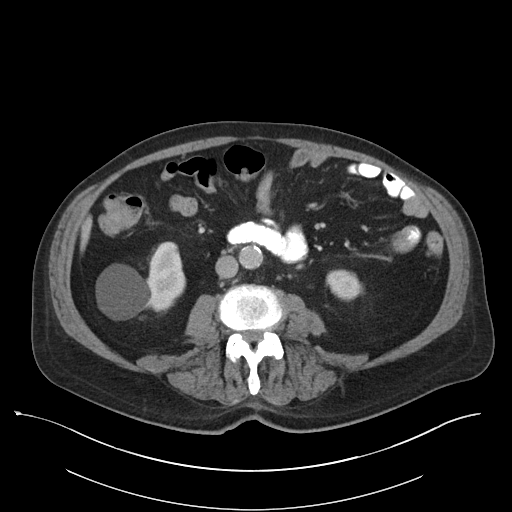
[im 63/100  soft-tissue]
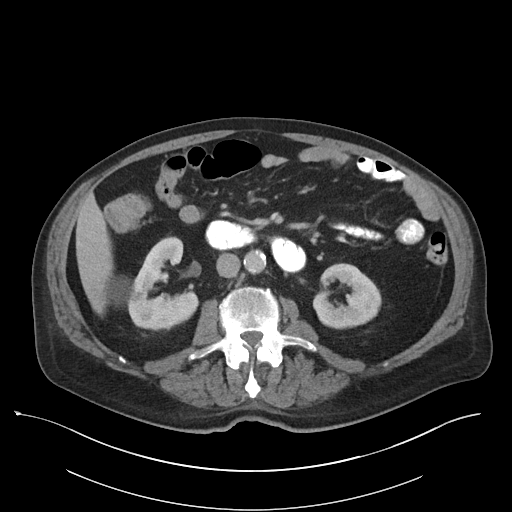
[im 63/100  bone]
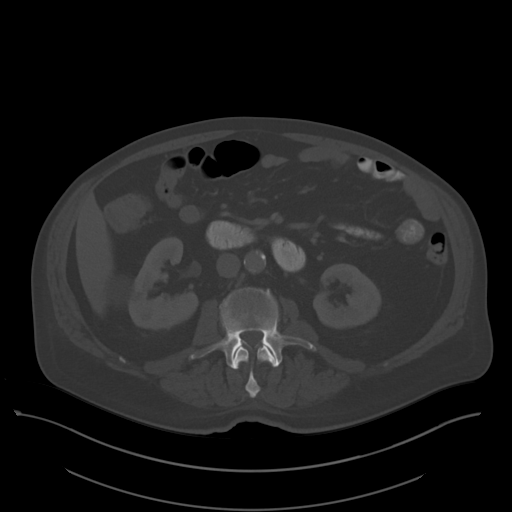
[im 73/100  soft-tissue]
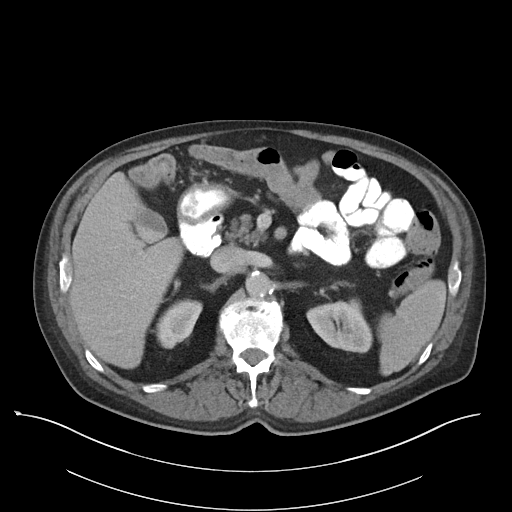
[im 79/100  soft-tissue]
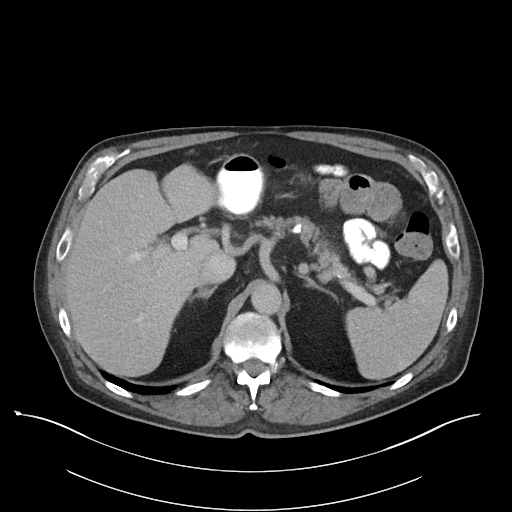
[im 84/100  soft-tissue]
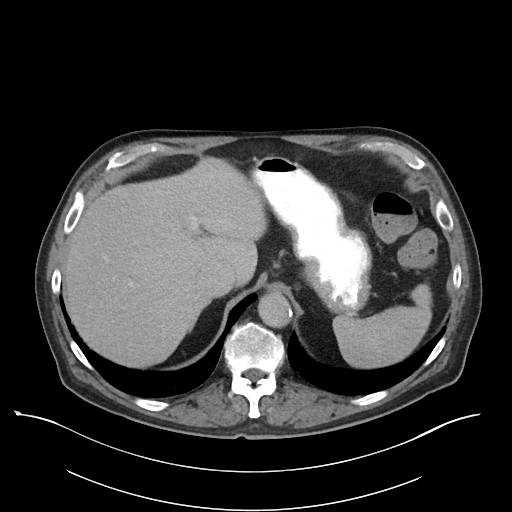
[im 94/100  soft-tissue]
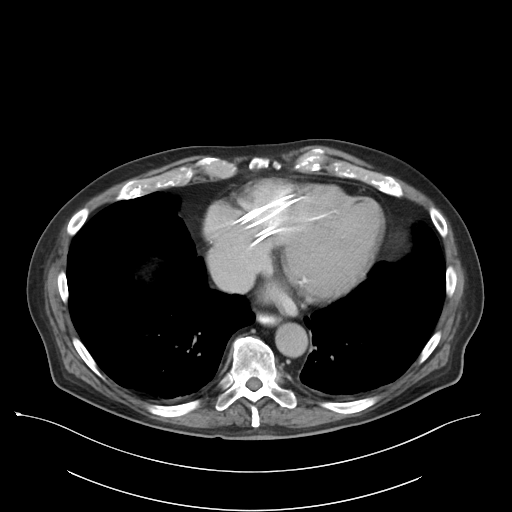

[Series 5: coronal · coronal · 0.94mm/px · 3 of 101 slices shown]
[im 34/101  soft-tissue]
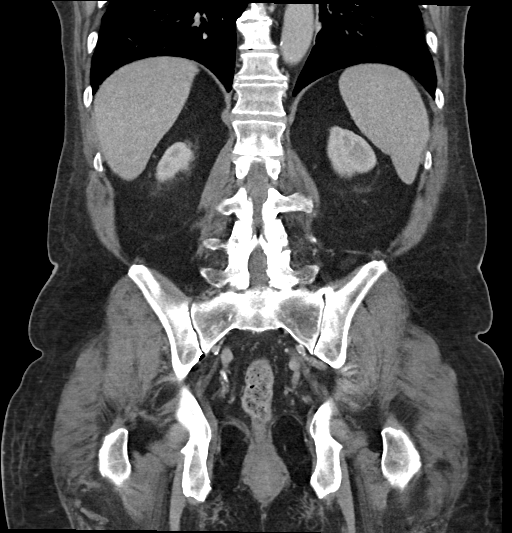
[im 45/101  soft-tissue]
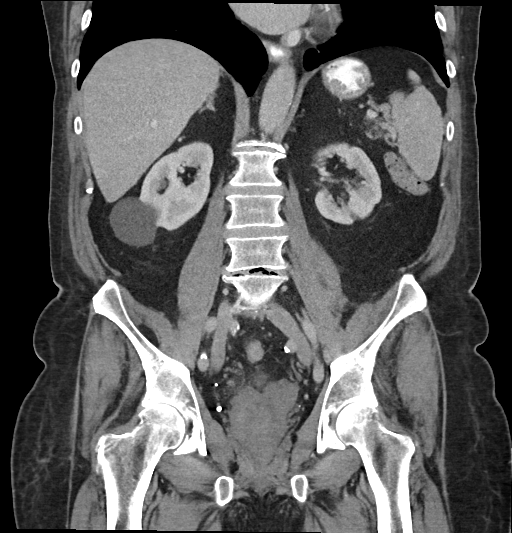
[im 56/101  soft-tissue]
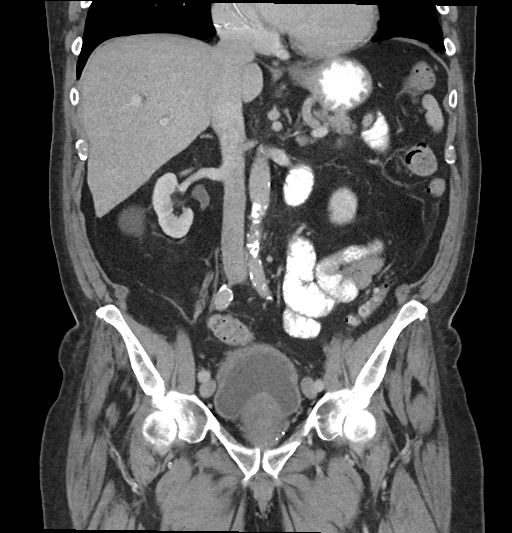

[16 of 46 positions shown; findings below may reference images not displayed]

RADIATION DOSE REDUCTION: This exam was performed according to the
departmental dose-optimization program which includes automated
exposure control, adjustment of the mA and/or kV according to
patient size and/or use of iterative reconstruction technique.

CONTRAST:  100mL OMNIPAQUE IOHEXOL 300 MG/ML SOLN additional oral
enteric contrast
FINDINGS: Lower chest: No acute abnormality. Cardiomegaly. Coronary artery
calcifications.

Hepatobiliary: No solid liver abnormality is seen. Tiny gallstones
and or sludge in the gallbladder. Gallbladder wall thickening, or
biliary dilatation.

Pancreas: Unremarkable. No pancreatic ductal dilatation or
surrounding inflammatory changes.

Spleen: Normal in size without significant abnormality.

Adrenals/Urinary Tract: Adrenal glands are unremarkable. Simple,
benign bilateral renal cortical cysts as well as a benign
subcentimeter macroscopic fat containing left renal angiomyolipoma,
for which no further follow-up or characterization is required.
Kidneys are otherwise normal, without renal calculi, solid lesion,
or hydronephrosis. Bladder is unremarkable.

Stomach/Bowel: Stomach is within normal limits. Appendix is
surgically absent. No evidence of bowel wall thickening, distention,
or inflammatory changes. Descending and sigmoid diverticulosis.

Vascular/Lymphatic: Aortic atherosclerosis. No enlarged abdominal or
pelvic lymph nodes.

Reproductive: Severe prostatomegaly.

Other: No abdominal wall hernia or abnormality. No ascites.

Musculoskeletal: No acute or significant osseous findings.
IMPRESSION: 1. No acute CT findings of the abdomen or pelvis to explain lower
abdominal pain.
2. Descending and sigmoid diverticulosis without evidence of acute
diverticulitis.
3. Severe prostatomegaly.
4. Tiny gallstones and or sludge in the gallbladder. No evidence of
acute cholecystitis.
5. Coronary artery disease.

These results will be called to the ordering clinician or
representative by the Radiologist Assistant, and communication
documented in the PACS or [REDACTED].

Aortic Atherosclerosis ([id number]-VBX.X).

## 2022-12-27 ENCOUNTER — Other Ambulatory Visit: Payer: Self-pay

## 2022-12-27 ENCOUNTER — Other Ambulatory Visit (HOSPITAL_BASED_OUTPATIENT_CLINIC_OR_DEPARTMENT_OTHER): Payer: Self-pay

## 2022-12-29 ENCOUNTER — Other Ambulatory Visit (HOSPITAL_BASED_OUTPATIENT_CLINIC_OR_DEPARTMENT_OTHER): Payer: Self-pay

## 2022-12-29 DIAGNOSIS — Z23 Encounter for immunization: Secondary | ICD-10-CM | POA: Diagnosis not present

## 2022-12-29 MED ORDER — INFLUENZA VAC A&B SURF ANT ADJ 0.5 ML IM SUSY
0.5000 mL | PREFILLED_SYRINGE | Freq: Once | INTRAMUSCULAR | 0 refills | Status: AC
  Filled 2022-12-29: qty 0.5, 1d supply, fill #0

## 2022-12-30 ENCOUNTER — Other Ambulatory Visit (HOSPITAL_BASED_OUTPATIENT_CLINIC_OR_DEPARTMENT_OTHER): Payer: Self-pay

## 2023-01-03 ENCOUNTER — Other Ambulatory Visit: Payer: Self-pay

## 2023-01-04 ENCOUNTER — Encounter: Payer: Self-pay | Admitting: Gastroenterology

## 2023-01-06 ENCOUNTER — Other Ambulatory Visit (HOSPITAL_BASED_OUTPATIENT_CLINIC_OR_DEPARTMENT_OTHER): Payer: Self-pay

## 2023-01-06 DIAGNOSIS — Z79891 Long term (current) use of opiate analgesic: Secondary | ICD-10-CM | POA: Diagnosis not present

## 2023-01-06 DIAGNOSIS — G894 Chronic pain syndrome: Secondary | ICD-10-CM | POA: Diagnosis not present

## 2023-01-06 DIAGNOSIS — M48062 Spinal stenosis, lumbar region with neurogenic claudication: Secondary | ICD-10-CM | POA: Diagnosis not present

## 2023-01-06 DIAGNOSIS — M47812 Spondylosis without myelopathy or radiculopathy, cervical region: Secondary | ICD-10-CM | POA: Diagnosis not present

## 2023-01-06 MED ORDER — BUTALBITAL-APAP-CAFFEINE 50-325-40 MG PO TABS
1.0000 | ORAL_TABLET | Freq: Three times a day (TID) | ORAL | 2 refills | Status: DC | PRN
  Filled 2023-01-06 – 2023-02-19 (×3): qty 90, 30d supply, fill #0
  Filled 2023-03-22: qty 90, 30d supply, fill #1
  Filled 2023-04-20: qty 90, 30d supply, fill #2

## 2023-01-06 MED ORDER — HYDROCODONE-ACETAMINOPHEN 10-325 MG PO TABS
1.0000 | ORAL_TABLET | Freq: Three times a day (TID) | ORAL | 0 refills | Status: DC
  Filled 2023-01-06 – 2023-01-20 (×2): qty 90, 30d supply, fill #0

## 2023-01-07 ENCOUNTER — Telehealth: Payer: Self-pay | Admitting: Gastroenterology

## 2023-01-07 ENCOUNTER — Other Ambulatory Visit (HOSPITAL_BASED_OUTPATIENT_CLINIC_OR_DEPARTMENT_OTHER): Payer: Self-pay

## 2023-01-07 ENCOUNTER — Other Ambulatory Visit: Payer: Self-pay

## 2023-01-07 DIAGNOSIS — K5792 Diverticulitis of intestine, part unspecified, without perforation or abscess without bleeding: Secondary | ICD-10-CM

## 2023-01-07 MED ORDER — CIPROFLOXACIN HCL 500 MG PO TABS
500.0000 mg | ORAL_TABLET | Freq: Two times a day (BID) | ORAL | 0 refills | Status: DC
  Filled 2023-01-07: qty 20, 10d supply, fill #0

## 2023-01-07 MED ORDER — METRONIDAZOLE 500 MG PO TABS
500.0000 mg | ORAL_TABLET | Freq: Three times a day (TID) | ORAL | 0 refills | Status: DC
  Filled 2023-01-07: qty 20, 7d supply, fill #0

## 2023-01-07 NOTE — Telephone Encounter (Signed)
C/o severe LLQ abd pain similar to when he had acute diverticulitis episode, woke him from sleep at 2AM with nausea and chills Start Abx Cipro 500mg  BID and Flagyl 500mg  TID X 10 days Advised him to call with any worsening symptoms, will obtain CT abd & pelvis to exclude any complications from diverticulitis

## 2023-01-08 ENCOUNTER — Emergency Department (HOSPITAL_BASED_OUTPATIENT_CLINIC_OR_DEPARTMENT_OTHER): Payer: Medicare Other

## 2023-01-08 ENCOUNTER — Inpatient Hospital Stay (HOSPITAL_BASED_OUTPATIENT_CLINIC_OR_DEPARTMENT_OTHER)
Admission: EM | Admit: 2023-01-08 | Discharge: 2023-01-14 | DRG: 392 | Disposition: A | Discharge status: Home / Self Care | Payer: Medicare Other | Attending: Family Medicine | Admitting: Family Medicine

## 2023-01-08 ENCOUNTER — Encounter (HOSPITAL_BASED_OUTPATIENT_CLINIC_OR_DEPARTMENT_OTHER): Payer: Self-pay | Admitting: Emergency Medicine

## 2023-01-08 ENCOUNTER — Other Ambulatory Visit: Payer: Self-pay

## 2023-01-08 DIAGNOSIS — N3289 Other specified disorders of bladder: Secondary | ICD-10-CM | POA: Diagnosis not present

## 2023-01-08 DIAGNOSIS — R1032 Left lower quadrant pain: Secondary | ICD-10-CM

## 2023-01-08 DIAGNOSIS — N281 Cyst of kidney, acquired: Secondary | ICD-10-CM | POA: Diagnosis not present

## 2023-01-08 DIAGNOSIS — I4819 Other persistent atrial fibrillation: Secondary | ICD-10-CM | POA: Diagnosis not present

## 2023-01-08 DIAGNOSIS — T474X5A Adverse effect of other laxatives, initial encounter: Secondary | ICD-10-CM | POA: Diagnosis not present

## 2023-01-08 DIAGNOSIS — Z818 Family history of other mental and behavioral disorders: Secondary | ICD-10-CM

## 2023-01-08 DIAGNOSIS — E782 Mixed hyperlipidemia: Secondary | ICD-10-CM | POA: Diagnosis not present

## 2023-01-08 DIAGNOSIS — I25118 Atherosclerotic heart disease of native coronary artery with other forms of angina pectoris: Secondary | ICD-10-CM | POA: Diagnosis present

## 2023-01-08 DIAGNOSIS — I10 Essential (primary) hypertension: Secondary | ICD-10-CM | POA: Diagnosis not present

## 2023-01-08 DIAGNOSIS — K5732 Diverticulitis of large intestine without perforation or abscess without bleeding: Secondary | ICD-10-CM | POA: Diagnosis present

## 2023-01-08 DIAGNOSIS — Z8673 Personal history of transient ischemic attack (TIA), and cerebral infarction without residual deficits: Secondary | ICD-10-CM

## 2023-01-08 DIAGNOSIS — Z8249 Family history of ischemic heart disease and other diseases of the circulatory system: Secondary | ICD-10-CM

## 2023-01-08 DIAGNOSIS — K5792 Diverticulitis of intestine, part unspecified, without perforation or abscess without bleeding: Principal | ICD-10-CM | POA: Diagnosis present

## 2023-01-08 DIAGNOSIS — K521 Toxic gastroenteritis and colitis: Secondary | ICD-10-CM | POA: Diagnosis not present

## 2023-01-08 DIAGNOSIS — I495 Sick sinus syndrome: Secondary | ICD-10-CM | POA: Diagnosis present

## 2023-01-08 DIAGNOSIS — R030 Elevated blood-pressure reading, without diagnosis of hypertension: Secondary | ICD-10-CM

## 2023-01-08 DIAGNOSIS — Z955 Presence of coronary angioplasty implant and graft: Secondary | ICD-10-CM

## 2023-01-08 DIAGNOSIS — M159 Polyosteoarthritis, unspecified: Secondary | ICD-10-CM | POA: Diagnosis present

## 2023-01-08 DIAGNOSIS — G904 Autonomic dysreflexia: Secondary | ICD-10-CM | POA: Diagnosis not present

## 2023-01-08 DIAGNOSIS — D630 Anemia in neoplastic disease: Secondary | ICD-10-CM | POA: Diagnosis not present

## 2023-01-08 DIAGNOSIS — Z950 Presence of cardiac pacemaker: Secondary | ICD-10-CM

## 2023-01-08 DIAGNOSIS — I110 Hypertensive heart disease with heart failure: Secondary | ICD-10-CM | POA: Diagnosis present

## 2023-01-08 DIAGNOSIS — Z79899 Other long term (current) drug therapy: Secondary | ICD-10-CM

## 2023-01-08 DIAGNOSIS — Z7901 Long term (current) use of anticoagulants: Secondary | ICD-10-CM

## 2023-01-08 DIAGNOSIS — E876 Hypokalemia: Secondary | ICD-10-CM | POA: Diagnosis not present

## 2023-01-08 DIAGNOSIS — Z7952 Long term (current) use of systemic steroids: Secondary | ICD-10-CM

## 2023-01-08 DIAGNOSIS — E039 Hypothyroidism, unspecified: Secondary | ICD-10-CM | POA: Diagnosis present

## 2023-01-08 DIAGNOSIS — G8929 Other chronic pain: Secondary | ICD-10-CM | POA: Diagnosis present

## 2023-01-08 DIAGNOSIS — I5042 Chronic combined systolic (congestive) and diastolic (congestive) heart failure: Secondary | ICD-10-CM | POA: Diagnosis present

## 2023-01-08 DIAGNOSIS — D649 Anemia, unspecified: Secondary | ICD-10-CM

## 2023-01-08 DIAGNOSIS — Z953 Presence of xenogenic heart valve: Secondary | ICD-10-CM

## 2023-01-08 DIAGNOSIS — Z888 Allergy status to other drugs, medicaments and biological substances status: Secondary | ICD-10-CM

## 2023-01-08 DIAGNOSIS — I160 Hypertensive urgency: Secondary | ICD-10-CM | POA: Diagnosis present

## 2023-01-08 DIAGNOSIS — M47816 Spondylosis without myelopathy or radiculopathy, lumbar region: Secondary | ICD-10-CM | POA: Diagnosis present

## 2023-01-08 DIAGNOSIS — Z91041 Radiographic dye allergy status: Secondary | ICD-10-CM

## 2023-01-08 DIAGNOSIS — D472 Monoclonal gammopathy: Secondary | ICD-10-CM | POA: Diagnosis present

## 2023-01-08 DIAGNOSIS — K8020 Calculus of gallbladder without cholecystitis without obstruction: Secondary | ICD-10-CM | POA: Diagnosis not present

## 2023-01-08 DIAGNOSIS — F419 Anxiety disorder, unspecified: Secondary | ICD-10-CM | POA: Diagnosis present

## 2023-01-08 DIAGNOSIS — I5032 Chronic diastolic (congestive) heart failure: Secondary | ICD-10-CM | POA: Diagnosis not present

## 2023-01-08 DIAGNOSIS — Z952 Presence of prosthetic heart valve: Secondary | ICD-10-CM

## 2023-01-08 DIAGNOSIS — K5909 Other constipation: Secondary | ICD-10-CM | POA: Diagnosis not present

## 2023-01-08 DIAGNOSIS — D6859 Other primary thrombophilia: Secondary | ICD-10-CM | POA: Diagnosis present

## 2023-01-08 DIAGNOSIS — N400 Enlarged prostate without lower urinary tract symptoms: Secondary | ICD-10-CM | POA: Diagnosis not present

## 2023-01-08 DIAGNOSIS — Z79891 Long term (current) use of opiate analgesic: Secondary | ICD-10-CM

## 2023-01-08 DIAGNOSIS — R935 Abnormal findings on diagnostic imaging of other abdominal regions, including retroperitoneum: Secondary | ICD-10-CM | POA: Diagnosis not present

## 2023-01-08 DIAGNOSIS — Z7902 Long term (current) use of antithrombotics/antiplatelets: Secondary | ICD-10-CM

## 2023-01-08 DIAGNOSIS — Z887 Allergy status to serum and vaccine status: Secondary | ICD-10-CM

## 2023-01-08 DIAGNOSIS — Z792 Long term (current) use of antibiotics: Secondary | ICD-10-CM

## 2023-01-08 LAB — COMPREHENSIVE METABOLIC PANEL
ALT: 15 U/L (ref 0–44)
AST: 17 U/L (ref 15–41)
Albumin: 4.1 g/dL (ref 3.5–5.0)
Alkaline Phosphatase: 114 U/L (ref 38–126)
Anion gap: 8 (ref 5–15)
BUN: 11 mg/dL (ref 8–23)
CO2: 29 mmol/L (ref 22–32)
Calcium: 9.6 mg/dL (ref 8.9–10.3)
Chloride: 100 mmol/L (ref 98–111)
Creatinine, Ser: 0.91 mg/dL (ref 0.61–1.24)
GFR, Estimated: >60 mL/min (ref >60)
Glucose, Bld: 124 mg/dL — ABNORMAL HIGH (ref 70–99)
Potassium: 3.9 mmol/L (ref 3.5–5.1)
Sodium: 137 mmol/L (ref 135–145)
Total Bilirubin: 0.7 mg/dL (ref <1.2)
Total Protein: 8 g/dL (ref 6.5–8.1)

## 2023-01-08 LAB — CBC WITH DIFFERENTIAL/PLATELET
Abs Immature Granulocytes: 0.03 10*3/uL (ref 0.00–0.07)
Basophils Absolute: 0 10*3/uL (ref 0.0–0.1)
Basophils Relative: 0 %
Eosinophils Absolute: 0.1 10*3/uL (ref 0.0–0.5)
Eosinophils Relative: 1 %
HCT: 35.1 % — ABNORMAL LOW (ref 39.0–52.0)
Hemoglobin: 11.4 g/dL — ABNORMAL LOW (ref 13.0–17.0)
Immature Granulocytes: 0 %
Lymphocytes Relative: 7 %
Lymphs Abs: 0.6 10*3/uL — ABNORMAL LOW (ref 0.7–4.0)
MCH: 28.6 pg (ref 26.0–34.0)
MCHC: 32.5 g/dL (ref 30.0–36.0)
MCV: 88.2 fL (ref 80.0–100.0)
Monocytes Absolute: 0.7 10*3/uL (ref 0.1–1.0)
Monocytes Relative: 8 %
Neutro Abs: 7.6 10*3/uL (ref 1.7–7.7)
Neutrophils Relative %: 84 %
Platelets: 209 10*3/uL (ref 150–400)
RBC: 3.98 MIL/uL — ABNORMAL LOW (ref 4.22–5.81)
RDW: 14.9 % (ref 11.5–15.5)
WBC: 9.1 10*3/uL (ref 4.0–10.5)
nRBC: 0 % (ref 0.0–0.2)

## 2023-01-08 LAB — LIPASE, BLOOD: Lipase: 12 U/L (ref 11–51)

## 2023-01-08 MED ORDER — ORAL CARE MOUTH RINSE: 15.0000 mL | OROMUCOSAL | Status: DC | PRN

## 2023-01-08 MED ORDER — LORAZEPAM 1 MG PO TABS
1.0000 mg | ORAL_TABLET | Freq: Every day | ORAL | Status: DC
  Administered 2023-01-08 – 2023-01-13 (×6): 1 mg via ORAL
  Filled 2023-01-08 (×6): qty 1

## 2023-01-08 MED ORDER — PIPERACILLIN-TAZOBACTAM 3.375 G IVPB
3.3750 g | Freq: Three times a day (TID) | INTRAVENOUS | Status: DC
  Administered 2023-01-09 – 2023-01-14 (×16): 3.375 g via INTRAVENOUS
  Filled 2023-01-08 (×17): qty 50

## 2023-01-08 MED ORDER — PIPERACILLIN-TAZOBACTAM 3.375 G IVPB 30 MIN
3.3750 g | Freq: Once | INTRAVENOUS | Status: AC
  Administered 2023-01-08: 3.375 g via INTRAVENOUS
  Filled 2023-01-08: qty 50

## 2023-01-08 MED ORDER — LACTATED RINGERS IV SOLN: INTRAVENOUS | Status: AC

## 2023-01-08 MED ORDER — ROSUVASTATIN CALCIUM 20 MG PO TABS
20.0000 mg | ORAL_TABLET | Freq: Every day | ORAL | Status: DC
  Administered 2023-01-08 – 2023-01-13 (×6): 20 mg via ORAL
  Filled 2023-01-08 (×6): qty 1

## 2023-01-08 MED ORDER — MORPHINE SULFATE (PF) 2 MG/ML IV SOLN: 1.0000 mg | INTRAVENOUS | Status: DC | PRN

## 2023-01-08 MED ORDER — PANTOPRAZOLE SODIUM 40 MG PO TBEC
40.0000 mg | DELAYED_RELEASE_TABLET | Freq: Every day | ORAL | Status: DC
  Administered 2023-01-09 – 2023-01-14 (×6): 40 mg via ORAL
  Filled 2023-01-08 (×6): qty 1

## 2023-01-08 MED ORDER — SODIUM CHLORIDE 0.9% FLUSH
3.0000 mL | Freq: Two times a day (BID) | INTRAVENOUS | Status: DC
  Administered 2023-01-08 – 2023-01-14 (×4): 3 mL via INTRAVENOUS

## 2023-01-08 MED ORDER — ACETAMINOPHEN 650 MG RE SUPP: 650.0000 mg | Freq: Four times a day (QID) | RECTAL | Status: DC | PRN

## 2023-01-08 MED ORDER — PREDNISONE 5 MG PO TABS
5.0000 mg | ORAL_TABLET | Freq: Every day | ORAL | Status: DC
  Administered 2023-01-09 – 2023-01-14 (×6): 5 mg via ORAL
  Filled 2023-01-08 (×6): qty 1

## 2023-01-08 MED ORDER — EZETIMIBE 10 MG PO TABS
10.0000 mg | ORAL_TABLET | Freq: Every day | ORAL | Status: DC
  Administered 2023-01-09 – 2023-01-13 (×5): 10 mg via ORAL
  Filled 2023-01-08 (×7): qty 1

## 2023-01-08 MED ORDER — METOPROLOL SUCCINATE ER 50 MG PO TB24
50.0000 mg | ORAL_TABLET | Freq: Two times a day (BID) | ORAL | Status: DC
  Administered 2023-01-09 – 2023-01-13 (×9): 50 mg via ORAL
  Filled 2023-01-08 (×9): qty 1

## 2023-01-08 MED ORDER — BUTALBITAL-APAP-CAFFEINE 50-325-40 MG PO TABS: 1.0000 | ORAL_TABLET | Freq: Once | ORAL | Status: DC

## 2023-01-08 MED ORDER — METOPROLOL SUCCINATE ER 50 MG PO TB24
50.0000 mg | ORAL_TABLET | Freq: Once | ORAL | Status: AC
  Administered 2023-01-08: 50 mg via ORAL
  Filled 2023-01-08: qty 1

## 2023-01-08 MED ORDER — HYDROCODONE-ACETAMINOPHEN 10-325 MG PO TABS
1.0000 | ORAL_TABLET | Freq: Three times a day (TID) | ORAL | Status: DC
  Administered 2023-01-08 – 2023-01-14 (×17): 1 via ORAL
  Filled 2023-01-08 (×17): qty 1

## 2023-01-08 MED ORDER — MAGNESIUM OXIDE -MG SUPPLEMENT 400 (240 MG) MG PO TABS
400.0000 mg | ORAL_TABLET | Freq: Every day | ORAL | Status: DC
  Administered 2023-01-09 – 2023-01-14 (×6): 400 mg via ORAL
  Filled 2023-01-08 (×6): qty 1

## 2023-01-08 MED ORDER — ACETAMINOPHEN 325 MG PO TABS
650.0000 mg | ORAL_TABLET | Freq: Four times a day (QID) | ORAL | Status: DC | PRN
  Administered 2023-01-10: 650 mg via ORAL
  Filled 2023-01-08: qty 2

## 2023-01-08 MED ORDER — DIGOXIN 125 MCG PO TABS
0.1250 mg | ORAL_TABLET | Freq: Every day | ORAL | Status: DC
  Administered 2023-01-09 – 2023-01-14 (×6): 0.125 mg via ORAL
  Filled 2023-01-08 (×6): qty 1

## 2023-01-08 MED ORDER — APIXABAN 5 MG PO TABS
5.0000 mg | ORAL_TABLET | Freq: Two times a day (BID) | ORAL | Status: DC
  Administered 2023-01-08 – 2023-01-14 (×12): 5 mg via ORAL
  Filled 2023-01-08 (×12): qty 1

## 2023-01-08 MED ORDER — CILOSTAZOL 100 MG PO TABS
100.0000 mg | ORAL_TABLET | Freq: Two times a day (BID) | ORAL | Status: DC
  Administered 2023-01-08 – 2023-01-14 (×12): 100 mg via ORAL
  Filled 2023-01-08 (×12): qty 1

## 2023-01-08 MED ORDER — ONDANSETRON HCL 4 MG/2ML IJ SOLN: 4.0000 mg | Freq: Four times a day (QID) | INTRAMUSCULAR | Status: DC | PRN

## 2023-01-08 MED ORDER — HYDRALAZINE HCL 20 MG/ML IJ SOLN
10.0000 mg | Freq: Four times a day (QID) | INTRAMUSCULAR | Status: DC | PRN
Start: 2023-01-08 — End: 2023-01-10
  Administered 2023-01-10: 10 mg via INTRAVENOUS
  Filled 2023-01-08: qty 1

## 2023-01-08 MED ORDER — ONDANSETRON HCL 4 MG PO TABS: 4.0000 mg | ORAL_TABLET | Freq: Four times a day (QID) | ORAL | Status: DC | PRN

## 2023-01-08 MED ORDER — BUTALBITAL-APAP-CAFFEINE 50-325-40 MG PO TABS
1.0000 | ORAL_TABLET | Freq: Three times a day (TID) | ORAL | Status: DC
  Administered 2023-01-08 – 2023-01-13 (×16): 1 via ORAL
  Filled 2023-01-08 (×16): qty 1

## 2023-01-08 MED ORDER — LORAZEPAM 0.5 MG PO TABS: 0.5000 mg | ORAL_TABLET | Freq: Every day | ORAL | Status: DC | PRN

## 2023-01-08 MED ORDER — METOPROLOL TARTRATE 25 MG PO TABS
50.0000 mg | ORAL_TABLET | Freq: Once | ORAL | Status: AC
  Administered 2023-01-08: 50 mg via ORAL
  Filled 2023-01-08: qty 2

## 2023-01-08 MED ORDER — DOXAZOSIN MESYLATE 2 MG PO TABS
4.0000 mg | ORAL_TABLET | Freq: Every day | ORAL | Status: DC
  Administered 2023-01-09 – 2023-01-13 (×5): 4 mg via ORAL
  Filled 2023-01-08 (×7): qty 2

## 2023-01-08 NOTE — H&P (Signed)
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  Patient has been following with his gastroenterologist Dr. Lavon Paganini for management of chronic constipation as well as recent diagnosis of diverticulitis.  He had a CT scan on 10/14 which showed descending and sigmoid colonic diverticulosis with chronic versus acute inflammation in the proximal sigmoid colon.  He was put on a course of Augmentin which he completed with some improvement however symptoms returned.  He was started on a course of Cipro/Flagyl 2 days ago.  Despite antibiotics he has been having persistent and worsening abdominal pain.  Initially this was localized to the left lower quadrant however now more notable below the umbilical area.  He reports a chronic history of constipation for which he has had adjustments in his medications including Linzess and Movantik.  Last bowel movement was day prior to admission.  He has had some nausea but no emesis.  No fevers, chills, diaphoresis.  He has chronic mild dyspnea with exertion as well as intermittent angina which is not changed from his baseline.  He is on chronic Eliquis and has not seen any obvious bleeding.  [facility] ED Course  Labs/Imaging on admission: I have personally reviewed following labs and imaging studies.  Initial vitals showed BP 161/97, pulse 73, RR 14, temp 97.6 F, SpO2 96% on room air.  BP as high as  206/103.  Labs showed WBC 9.1, hemoglobin 11.4, platelets 209,000, sodium 137, potassium 3.9, bicarb 29, BUN 11, creatinine 0.91, serum glucose 124, LFTs within normal limits, lipase 12.  CT abdomen/pelvis without contrast showed focus of acute diverticulitis involving the upper sigmoid colon with marked wall thickening and pericolonic inflammatory changes.  No free air or abscess.  Cholelithiasis without findings for acute cholecystitis.  Enlarged prostate gland with median lobe hypertrophy impressing on the base of the bladder.  Stable mild irregular bladder wall thickening.  Suspect small cellules and trabeculation.  No bladder mass or calculi.  Moderate bladder distention.  Patient was given IV Zosyn and oral Lopressor 50 mg.  EDP spoke with GI Dr. Lavon Paganini whose team will follow in hospital.  The hospitalist service was consulted to admit for further evaluation and management.  Review of Systems: All systems reviewed and are negative except as documented in history of present illness above.   Past Medical History:  Diagnosis Date   Aortic stenosis    moderate aortic stenosis   Arthritis    Benign prostatic hypertrophy    Chronic systolic heart failure (HCC)    Chronotropic incompetence with sinus node dysfunction    Status post Guidant dual-mode, dual-pacing, dual-sensing  pacemaker   implantation now programmed to AAI with recent generator change.   Coronary artery disease    status post multiple prior percutaneous coronary interventions, microvascular angina per Dr [name]   Diverticulitis sigmoid colon recurrent    Dysfunctional autonomic nervous system    Dyspnea    Heart murmur    History of primary  hypertension    Hypercoagulable state (HCC)    chronically anticoagulated with coumadin   Hyperlipidemia    Hyperthyroidism    Hypothyroidism    Dr. [name]   MGUS (monoclonal gammopathy of unknown significance) 02/17/2013   Ocular myasthenia (HCC)    Osteoarthritis     Paroxysmal atrial fibrillation (HCC)    DR. Riley Kill,    Prediabetes 09/21/2017   Stroke (HCC)    1990    Past Surgical History:  Procedure Laterality Date   AORTIC VALVE REPLACEMENT  03/15/2011   Procedure: AORTIC VALVE REPLACEMENT (AVR);  Surgeon: Alleen Borne, MD;  Location: [facility] OR;  Service: Open Heart Surgery;  Laterality: N/A;   APPENDECTOMY     CARDIAC CATHETERIZATION     13   CARDIOVERSION     CARDIOVERSION  04/15/2011   Procedure: CARDIOVERSION;  Surgeon: Marca Ancona, MD;  Location: [facility] ENDOSCOPY;  Service: Cardiovascular;  Laterality: N/A;   CARDIOVERSION N/A 09/11/2014   Procedure: CARDIOVERSION;  Surgeon: Quintella Reichert, MD;  Location: [facility] ENDOSCOPY;  Service: Cardiovascular;  Laterality: N/A;   CARDIOVERSION N/A 06/27/2015   Procedure: CARDIOVERSION;  Surgeon: Vesta Mixer, MD;  Location: [facility] ENDOSCOPY;  Service: Cardiovascular;  Laterality: N/A;   CARDIOVERSION N/A 07/04/2015   Procedure: CARDIOVERSION;  Surgeon: Marinus Maw, MD;  Location: [facility] ENDOSCOPY;  Service: Cardiovascular;  Laterality: N/A;   CARDIOVERSION N/A 04/13/2017   Procedure: CARDIOVERSION;  Surgeon: Quintella Reichert, MD;  Location: [facility] ENDOSCOPY;  Service: Cardiovascular;  Laterality: N/A;   COLONOSCOPY     CORONARY STENT INTERVENTION N/A 10/15/2019   Procedure: CORONARY STENT INTERVENTION;  Surgeon: Corky Crafts, MD;  Location: [facility] INVASIVE CV LAB;  Service: Cardiovascular;  Laterality: N/A;   EP IMPLANTABLE DEVICE N/A 06/16/2015   Procedure: Pacemaker Implant;  Surgeon: Marinus Maw, MD;  Location: [facility] INVASIVE CV LAB;  Service: Cardiovascular;  Laterality: N/A;   hemrrhoidectomy     LEFT AND RIGHT HEART CATHETERIZATION WITH CORONARY ANGIOGRAM Bilateral 02/01/2011   Procedure: LEFT AND RIGHT HEART CATHETERIZATION WITH CORONARY ANGIOGRAM;  Surgeon: Herby Abraham, MD;  Location: [facility] CATH LAB;  Service: Cardiovascular;  Laterality: Bilateral;   LEFT HEART CATH AND CORONARY ANGIOGRAPHY N/A  10/15/2019   Procedure: LEFT HEART CATH AND CORONARY ANGIOGRAPHY;  Surgeon: Corky Crafts, MD;  Location: [facility] INVASIVE CV LAB;  Service: Cardiovascular;  Laterality: N/A;   LEFT HEART CATH AND CORONARY ANGIOGRAPHY N/A 10/07/2020   Procedure: LEFT HEART CATH AND CORONARY ANGIOGRAPHY;  Surgeon: Tonny Bollman, MD;  Location: [facility] INVASIVE CV LAB;  Service: Cardiovascular;  Laterality: N/A;   LEFT HEART CATH AND CORONARY ANGIOGRAPHY N/A 07/06/2022   Procedure: LEFT HEART CATH AND CORONARY ANGIOGRAPHY;  Surgeon: Tonny Bollman, MD;  Location: [facility] INVASIVE CV LAB;  Service: Cardiovascular;  Laterality: N/A;   LUMBAR LAMINECTOMY/DECOMPRESSION MICRODISCECTOMY Right 02/23/2021   Procedure: Right Lumbar four-five microdiscectomy;  Surgeon: Tia Alert, MD;  Location: [facility] OR;  Service: Neurosurgery;  Laterality: Right;   MAZE  03/15/2011   Procedure: MAZE;  Surgeon: Alleen Borne, MD;  Location: [facility] OR;  Service: Open Heart Surgery;  Laterality: N/A;   MOHS SURGERY  09/2021   PACEMAKER INSERTION  1991   Guidant PPM, most recent Generator Change by Dr [name] was 08/22/06   RIGHT/LEFT HEART CATH AND CORONARY ANGIOGRAPHY N/A 07/06/2016   Procedure: Right/Left Heart Cath and Coronary Angiography;  Surgeon: Tonny Bollman, MD;  Location: [facility] INVASIVE CV LAB;  Service: Cardiovascular;  Laterality: N/A;  TEE WITHOUT CARDIOVERSION  04/15/2011   Procedure: TRANSESOPHAGEAL ECHOCARDIOGRAM (TEE);  Surgeon: Marca Ancona, MD;  Location: [facility] ENDOSCOPY;  Service: Cardiovascular;  Laterality: N/A;   TEE WITHOUT CARDIOVERSION N/A 09/11/2014   Procedure: TRANSESOPHAGEAL ECHOCARDIOGRAM (TEE);  Surgeon: Quintella Reichert, MD;  Location: [facility] ENDOSCOPY;  Service: Cardiovascular;  Laterality: N/A;    Social History:  reports that he has never smoked. He has never used smokeless tobacco. He reports that he does not drink alcohol and does not use drugs.  Allergies  Allergen Reactions   Contrast Media [Iodinated Contrast  Media] Hives   Gadolinium Derivatives Hives   Iodine-131 Hives   Metrizamide Hives   Tetanus Toxoids     Redness     Family History  Problem Relation Age of Onset   Heart disease Brother        Twin brother has coronary disease and recent AVR for AS   CAD Brother    Atrial fibrillation Brother    Sarcoidosis Brother    Depression Daughter    Anorexia nervosa Daughter    Hypertension Son    Anesthesia problems Neg Hx    Hypotension Neg Hx    Malignant hyperthermia Neg Hx    Pseudochol deficiency Neg Hx      Prior to Admission medications   Medication Sig Start Date End Date Taking? Authorizing Provider  amLODipine (NORVASC) 2.5 MG tablet Take 0.5 tablets (1.25 mg total) by mouth daily. Patient taking differently: Take 1.25 mg by mouth as needed. 05/20/22   Tonny Bollman, MD  apixaban (ELIQUIS) 5 MG TABS tablet Take 1 tablet (5 mg total) by mouth 2 (two) times daily. 11/10/22   Tonny Bollman, MD  butalbital-acetaminophen-caffeine (BAC) 50-325-40 MG tablet Take 1 tablet by mouth 3 (three) times daily. 12/13/22     butalbital-acetaminophen-caffeine (BAC) 50-325-40 MG tablet Take 1 tablet by mouth three times a day as needed pt aware of acetaminophen in Norco as well 01/06/23     cholecalciferol (VITAMIN D3) 25 MCG (1000 UNIT) tablet Take 1,000 Units by mouth daily.    [provider]  cilostazol (PLETAL) 100 MG tablet Take 1 tablet (100 mg total) by mouth 2 (two) times daily. 05/10/22   Tonny Bollman, MD  ciprofloxacin (CIPRO) 500 MG tablet Take 1 tablet (500 mg total) by mouth 2 (two) times daily. 01/07/23   Napoleon Form, MD  Coenzyme Q10 200 MG TABS Take 200 mg by mouth daily.    [provider]  desoximetasone (TOPICORT) 0.25 % cream Apply 1 Application topically 2 (two) times daily. Patient taking differently: Apply 1 Application topically 2 (two) times daily as needed (dermatitis). 05/18/22   Mahlon Gammon, MD  diclofenac Sodium (VOLTAREN) 1 % GEL  Apply 2 g topically 2 (two) times daily as needed (knee pain).    [provider]  digoxin (LANOXIN) 0.125 MG tablet Take 1 tablet (0.125 mg total) by mouth daily. 11/04/22   Mahlon Gammon, MD  diphenhydrAMINE (BENADRYL) 25 MG tablet Take 2 tablets (50 mg total) by mouth one hour prior to procedure Patient taking differently: Take 50 mg by mouth as needed. 06/29/22   Tonny Bollman, MD  diphenhydrAMINE (BENADRYL) 50 MG tablet Take 1 tablet (50 mg total) by mouth at bedtime as needed for itching. Use with CT scan. Instructions given. 12/07/22   Napoleon Form, MD  doxazosin (CARDURA) 4 MG tablet Take 1 tablet (4 mg total) by mouth daily. 10/12/22     ezetimibe (ZETIA) 10  MG tablet Take 1 tablet (10 mg total) by mouth daily. 07/05/22   Tonny Bollman, MD  furosemide (LASIX) 20 MG tablet Take 20 mg by mouth daily as needed for fluid.    [provider]  HYDROcodone-acetaminophen (NORCO) 10-325 MG tablet Take 1 tablet by mouth three times a day as directed DX G89.4 01/06/23     lidocaine (SALONPAS PAIN RELIEVING) 4 % Place 2 patches onto the skin daily as needed (pain).    [provider]  linaclotide Karlene Einstein) 290 MCG CAPS capsule Take 1 capsule (290 mcg total) by mouth daily before breakfast. 12/17/22   Nandigam, Eleonore Chiquito, MD  LORazepam (ATIVAN) 1 MG tablet Take 1 tablet (1 mg) by mouth at bedtime, and 0.5 tablet (0.5 mg) daily as needed for anxiety. 11/10/22   Mahlon Gammon, MD  magnesium oxide (MAG-OX) 400 MG tablet Take 1 tablet (400 mg total) by mouth daily. 02/15/22   Mahlon Gammon, MD  Menthol-Methyl Salicylate (SALONPAS PAIN RELIEF PATCH EX) Apply 2 patches topically daily as needed (pain).    [provider]  metoprolol succinate (TOPROL XL) 25 MG 24 hr tablet Take 3 tablets (75 mg total) by mouth 2 (two) times daily. Patient taking differently: Take 50 mg by mouth 2 (two) times daily. 05/20/22   Tonny Bollman, MD  metroNIDAZOLE (FLAGYL) 500 MG tablet  Take 1 tablet (500 mg total) by mouth 3 (three) times daily. 01/07/23   Napoleon Form, MD  midodrine (PROAMATINE) 2.5 MG tablet Take 1 tablet (2.5 mg total) by mouth 2 (two) times daily with a meal. Patient taking differently: Take 2.5 mg by mouth as needed. 11/30/21   Tonny Bollman, MD  Multiple Vitamins-Minerals (MULTIVITAMIN WITH MINERALS) tablet Take 1 tablet by mouth daily.    [provider]  naloxegol oxalate (MOVANTIK) 25 MG TABS tablet Take 1 tablet (25 mg total) by mouth daily. Take 12.5 for 2 weeks if tolerated can increase to 25 mg daily. 12/08/22   Napoleon Form, MD  nitroGLYCERIN (NITROSTAT) 0.4 MG SL tablet Place 1 tablet (0.4 mg total) under the tongue every 5 (five) minutes as needed for chest pain. Patient not taking: Reported on 12/17/2022 11/22/22 11/22/23  Mahlon Gammon, MD  Omeprazole-Sodium Bicarbonate (ZEGERID PO) Take 1 capsule by mouth daily as needed (acid reflux).    [provider]  pantoprazole (PROTONIX) 40 MG tablet Take 1 tablet (40 mg total) by mouth 2 (two) times daily. Patient taking differently: Take 40 mg by mouth daily. 01/15/22   Tonny Bollman, MD  Polyethyl Glycol-Propyl Glycol (SYSTANE OP) Place 1 drop into both eyes daily as needed (dry eyes).    [provider]  polyethylene glycol (MIRALAX / GLYCOLAX) 17 g packet Take 17 g by mouth every other day.    [provider]  potassium chloride SA (KLOR-CON M) 20 MEQ tablet Take 1 tablet (20 mEq total) by mouth daily. 12/07/22   Tonny Bollman, MD  predniSONE (DELTASONE) 5 MG tablet 1 tablet Orally Once a day    [provider]  predniSONE (DELTASONE) 50 MG tablet For use with the CT scan. Instructions have been given. Patient not taking: Reported on 12/17/2022 12/07/22   Napoleon Form, MD  rosuvastatin (CRESTOR) 20 MG tablet Take 1 tablet (20 mg total) by mouth at bedtime. Please keep scheduled appointment for future refills. 07/05/22 04/09/23  Tonny Bollman, MD  sennosides-docusate sodium (SENOKOT-S) 8.6-50 MG tablet Take 1 tablet by mouth 2 (two) times  daily.    [provider]  pregabalin (LYRICA) 75 MG capsule Take 75 mg by mouth 2 (two) times daily.  03/23/21  [provider]    Physical Exam: Vitals:   01/08/23 1630 01/08/23 1645 01/08/23 1700 01/08/23 1808  BP: (!) 194/92 (!) 204/86 (!) 198/95 (!) 179/100  Pulse:    67  Resp:    15  Temp:    98.2 F (36.8 C)  TempSrc:    Oral  SpO2:    96%  Weight:    92.9 kg  Height:    6\' 1"  (1.854 m)   Constitutional: Resting in bed, NAD, calm, comfortable Eyes: EOMI, lids and conjunctivae normal ENMT: Mucous membranes are moist. Posterior pharynx clear of any exudate or lesions.Normal dentition.  Neck: normal, supple, no masses. Respiratory: clear to auscultation bilaterally, no wheezing, no crackles. Normal respiratory effort. No accessory muscle use.  Cardiovascular: Regular rate and rhythm, valvular click. No extremity edema. 2+ pedal pulses.  Old PPM in place left chest wall, active PPM in place right chest wall. Abdomen: Tender to palpation below the umbilicus, suprapubic area, and LLQ Musculoskeletal: no clubbing / cyanosis. No joint deformity upper and lower extremities. Good ROM, no contractures. Normal muscle tone.  Skin: no rashes, lesions, ulcers. No induration Neurologic: Sensation intact. Strength 5/5 in all 4.  Psychiatric: Normal judgment and insight. Alert and oriented x 3. Normal mood.   EKG: Not performed.  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  Assessment and Plan: Acute sigmoid diverticulitis: CT shows acute sigmoid diverticulitis without evidence of free air or abscess.  Patient failed outpatient antibiotics. -Continue IV Zosyn -Will allow clear liquids otherwise bowel rest tonight -Gentle IV fluid hydration overnight -Analgesics and antiemetics as needed  Paroxysmal A-fib/flutter Sinus node dysfunction s/p PPM: Stable.  Continue Toprol-XL, digoxin, Eliquis.  Chronic HFpEF: Stable, appears euvolemic on admission.  Continue Toprol-XL.  Coronary artery disease s/p PCI: Stable, denies active chest pain.  Continue Toprol-XL, Pletal, Eliquis, rosuvastatin, Zetia.  MGUS: Follows with heme-onc Dr. Truett Perna, currently under observation.  Stable mild anemia noted.  Osteoarthritis: On chronic prednisone 5 mg daily, continue.  Hypertension: Continue Toprol-XL.  Patient reports history of occasional orthostasis and takes midodrine as needed.  Hyperlipidemia: Continue rosuvastatin and Zetia.  BPH: Continue Cardura.  Cholelithiasis: Noted on CT without evidence of acute cholecystitis.  Chronic pain: Continue home Norco and Fioricet.  Anxiety: Continue home Ativan.  Aortic stenosis s/p bioprosthetic AVR   DVT prophylaxis:  apixaban (ELIQUIS) tablet 5 mg   Code Status: Full code, confirmed with patient on admission Family Communication: Caregiver Kim at bedside Disposition Plan: From home and likely discharge to home pending clinical progress Consults called: EDP discussed with [facility]  GI Dr. Lavon Paganini Severity of Illness: The appropriate patient status for this patient is INPATIENT. Inpatient status is judged to be reasonable and necessary in order to provide the required intensity of service to ensure the patient's safety. The patient's presenting symptoms, physical exam findings, and initial radiographic and laboratory data in the context of their chronic comorbidities is felt to place them at high risk for further clinical deterioration. Furthermore, it is not anticipated that the patient will be medically stable for discharge from the hospital within 2 midnights of admission.   * I certify that at the point of admission it is my clinical judgment that the patient will require inpatient hospital care spanning beyond 2 midnights from the point of admission due to high intensity of service, high risk for further deterioration and high frequency of surveillance required.Darreld Mclean MD [facility]  If 7PM-7AM, please contact night-coverage [email or web address]  01/08/2023, 8:04 PM

## 2023-01-08 NOTE — ED Notes (Signed)
Pt took miralax this morning

## 2023-01-08 NOTE — Progress Notes (Signed)
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  Pharmacy has been consulted for Zosyn dosing.  Plan: Zosyn 3.375g IV q8h (4 hour infusion).  Height: 6\' 1"  (185.4 cm) Weight: 92.9 kg (204 lb 12.9 oz) IBW/kg (Calculated) : 79.9  Temp (24hrs), Avg:97.9 F (36.6 C), Min:97.6 F (36.4 C), Max:98.2 F (36.8 C)  Recent Labs  Lab 01/08/23 1240  WBC 9.1  CREATININE 0.91    Estimated Creatinine Clearance: 65.9 mL/min (by C-G formula based on SCr of 0.91 mg/dL).    Allergies  Allergen Reactions   Contrast Media [Iodinated Contrast Media] Hives   Gadolinium Derivatives Hives   Iodine-131 Hives   Metrizamide Hives   Tetanus Toxoids     Redness     Thank you for allowing pharmacy to be a part of this patient's care.  Herby Abraham, Pharm.D Use secure chat for questions 01/08/2023 7:32 PM

## 2023-01-08 NOTE — Care Plan (Signed)
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 Fredderick Phenix Reason for transfer: Acute diverticulitis failed outpatient treatment Facility course: Patient is a 88 year old gentleman history of CAD, aortic stenosis status post repair, BPH, paroxysmal A-fib on chronic anticoagulation, CHF, MGUS being followed in the outpatient setting by Dr. Lavon Paganini, GI presenting to the ED with worsening severe left lower quadrant abdominal pain similar to when he had an acute diverticulitis that awoke him up from sleep at 2 AM on the day of admission with some nausea and chills. Per ED physician patient was being treated in the outpatient setting for diverticulitis with Augmentin in mid October.  Patient had a CT scan in mid October that showed possible early versus chronic inflammation of the diverticulum.  Patient called his gastroenterologist office 1 day prior to admission and started on Cipro and Flagyl however due to ongoing worsening abdominal pain patient presented to the ED.  Comprehensive metabolic profile done unremarkable except a glucose of 124.  CBC done with a white count of 9.1, hemoglobin of 11.4 otherwise within normal limits. Repeat CT abdomen and pelvis done in the ED on day of admission with a focus of acute diverticulitis involving upper sigmoid colon with marked wall thickening and pericolonic inflammatory changes, no free air or abscess noted.  ED physician spoke with GI, Dr. Lavon Paganini who recommended patient be placed on IV Zosyn and admitted to the hospital for further evaluation and management and GI will see patient in formal consultation. Patient also noted to have a hypertensive urgency in the ED with blood pressure as high as 191/116.  ED physician stated would give antihypertensive medications for better blood pressure control.  Plan of care: The patient is accepted for admission  to Progressive unit, at [facility]..    Author: Ramiro Harvest, MD 01/08/2023  Check [email or web address] for on-call coverage.  Nursing staff, Please call [facility] Admits & Consults System-Wide number on Amion as soon as patient's arrival, so appropriate admitting provider can evaluate the pt.

## 2023-01-08 NOTE — Hospital Course (Signed)
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

## 2023-01-08 NOTE — ED Triage Notes (Signed)
Pt c/o ABD pain, reports sent by PCP for a CT.

## 2023-01-08 NOTE — ED Notes (Signed)
Attempted to call report, 4E will call back when available.

## 2023-01-08 NOTE — ED Notes (Signed)
Called [facility] for transport; pt bed assignment is ready

## 2023-01-08 NOTE — ED Provider Notes (Signed)
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  Patient is a 88 year old male who presents with abdominal pain.  He has a history of coronary artery disease, aortic stenosis status postrepair, BPH, paroxysmal atrial fibrillation, CHF and MGUS.  He is followed by Dr. Nathaniel Man with GI.  He has had some chronic constipation issues.  He recently was switched to Linzess.  He has been having pain in his left abdomen.  He had a CT scan in mid October which showed possible early versus chronic inflammation of diverticulum.  He said he was treated with Augmentin.  He recently has had some worsening pain over the last 2 days to his left lower abdomen.  He was started on Cipro and Flagyl yesterday.  He has not had any known fevers.  He is having some loose stools but that seems to be after treatment with MiraLAX.  He has had some nausea but no vomiting.  No urinary symptoms.       Home Medications Prior to Admission medications   Medication Sig Start Date End Date Taking? Authorizing Provider  amLODipine (NORVASC) 2.5 MG tablet Take 0.5 tablets (1.25 mg total) by mouth daily. Patient taking differently: Take 1.25 mg by mouth as needed. 05/20/22   Tonny Bollman, MD  apixaban (ELIQUIS) 5 MG TABS tablet Take 1 tablet (5 mg total) by mouth 2 (two) times daily. 11/10/22   Tonny Bollman, MD  butalbital-acetaminophen-caffeine (BAC) 50-325-40 MG tablet Take 1 tablet by mouth 3 (three) times daily. 12/13/22     butalbital-acetaminophen-caffeine (BAC) 50-325-40 MG tablet Take 1 tablet by mouth three times a day as needed pt aware of acetaminophen in Norco as well 01/06/23     cholecalciferol (VITAMIN D3) 25 MCG (1000 UNIT) tablet Take 1,000 Units by mouth daily.    [provider]  cilostazol (PLETAL) 100 MG  tablet Take 1 tablet (100 mg total) by mouth 2 (two) times daily. 05/10/22   Tonny Bollman, MD  ciprofloxacin (CIPRO) 500 MG tablet Take 1 tablet (500 mg total) by mouth 2 (two) times daily. 01/07/23   Napoleon Form, MD  Coenzyme Q10 200 MG TABS Take 200 mg by mouth daily.    [provider]  desoximetasone (TOPICORT) 0.25 % cream Apply 1 Application topically 2 (two) times daily. Patient taking differently: Apply 1 Application topically 2 (two) times daily as needed (dermatitis). 05/18/22   Mahlon Gammon, MD  diclofenac Sodium (VOLTAREN) 1 % GEL Apply 2 g topically 2 (two) times daily as needed (knee pain).    [provider]  digoxin (LANOXIN) 0.125 MG tablet Take 1 tablet (0.125 mg total) by mouth daily. 11/04/22   Mahlon Gammon, MD  diphenhydrAMINE (BENADRYL) 25 MG tablet Take 2 tablets (50 mg total) by mouth one hour prior to procedure Patient taking differently: Take 50 mg by mouth as needed. 06/29/22   Tonny Bollman, MD  diphenhydrAMINE (BENADRYL) 50 MG tablet Take 1 tablet (50 mg total) by mouth at bedtime as needed for itching. Use with CT scan. Instructions given. 12/07/22   Napoleon Form, MD  doxazosin (CARDURA) 4 MG tablet Take 1 tablet (4 mg total) by mouth daily. 10/12/22     ezetimibe (ZETIA) 10 MG tablet Take 1 tablet (10 mg  total) by mouth daily. 07/05/22   Tonny Bollman, MD  furosemide (LASIX) 20 MG tablet Take 20 mg by mouth daily as needed for fluid.    [provider]  HYDROcodone-acetaminophen (NORCO) 10-325 MG tablet Take 1 tablet by mouth three times a day as directed DX G89.4 01/06/23     lidocaine (SALONPAS PAIN RELIEVING) 4 % Place 2 patches onto the skin daily as needed (pain).    [provider]  linaclotide Karlene Einstein) 290 MCG CAPS capsule Take 1 capsule (290 mcg total) by mouth daily before breakfast. 12/17/22   Nandigam, Eleonore Chiquito, MD  LORazepam (ATIVAN) 1 MG tablet Take 1 tablet (1 mg) by mouth at bedtime, and 0.5 tablet  (0.5 mg) daily as needed for anxiety. 11/10/22   Mahlon Gammon, MD  magnesium oxide (MAG-OX) 400 MG tablet Take 1 tablet (400 mg total) by mouth daily. 02/15/22   Mahlon Gammon, MD  Menthol-Methyl Salicylate (SALONPAS PAIN RELIEF PATCH EX) Apply 2 patches topically daily as needed (pain).    [provider]  metoprolol succinate (TOPROL XL) 25 MG 24 hr tablet Take 3 tablets (75 mg total) by mouth 2 (two) times daily. Patient taking differently: Take 50 mg by mouth 2 (two) times daily. 05/20/22   Tonny Bollman, MD  metroNIDAZOLE (FLAGYL) 500 MG tablet Take 1 tablet (500 mg total) by mouth 3 (three) times daily. 01/07/23   Napoleon Form, MD  midodrine (PROAMATINE) 2.5 MG tablet Take 1 tablet (2.5 mg total) by mouth 2 (two) times daily with a meal. Patient taking differently: Take 2.5 mg by mouth as needed. 11/30/21   Tonny Bollman, MD  Multiple Vitamins-Minerals (MULTIVITAMIN WITH MINERALS) tablet Take 1 tablet by mouth daily.    [provider]  naloxegol oxalate (MOVANTIK) 25 MG TABS tablet Take 1 tablet (25 mg total) by mouth daily. Take 12.5 for 2 weeks if tolerated can increase to 25 mg daily. 12/08/22   Napoleon Form, MD  nitroGLYCERIN (NITROSTAT) 0.4 MG SL tablet Place 1 tablet (0.4 mg total) under the tongue every 5 (five) minutes as needed for chest pain. Patient not taking: Reported on 12/17/2022 11/22/22 11/22/23  Mahlon Gammon, MD  Omeprazole-Sodium Bicarbonate (ZEGERID PO) Take 1 capsule by mouth daily as needed (acid reflux).    [provider]  pantoprazole (PROTONIX) 40 MG tablet Take 1 tablet (40 mg total) by mouth 2 (two) times daily. Patient taking differently: Take 40 mg by mouth daily. 01/15/22   Tonny Bollman, MD  Polyethyl Glycol-Propyl Glycol (SYSTANE OP) Place 1 drop into both eyes daily as needed (dry eyes).    [provider]  polyethylene glycol (MIRALAX / GLYCOLAX) 17 g packet Take 17 g by mouth every other day.     [provider]  potassium chloride SA (KLOR-CON M) 20 MEQ tablet Take 1 tablet (20 mEq total) by mouth daily. 12/07/22   Tonny Bollman, MD  predniSONE (DELTASONE) 5 MG tablet 1 tablet Orally Once a day    [provider]  predniSONE (DELTASONE) 50 MG tablet For use with the CT scan. Instructions have been given. Patient not taking: Reported on 12/17/2022 12/07/22   Napoleon Form, MD  rosuvastatin (CRESTOR) 20 MG tablet Take 1 tablet (20 mg total) by mouth at bedtime. Please keep scheduled appointment for future refills. 07/05/22 04/09/23  Tonny Bollman, MD  sennosides-docusate sodium (SENOKOT-S) 8.6-50 MG tablet Take 1 tablet by mouth 2 (two) times daily.    [provider]  pregabalin (LYRICA) 75 MG capsule Take 75 mg by mouth 2 (two) times daily.  03/23/21  [provider]      Allergies    Contrast media [iodinated contrast media], Gadolinium derivatives, Iodine-131, Metrizamide, and Tetanus toxoids    Review of Systems   Review of Systems  Constitutional:  Negative for activity change and fever.  Respiratory:  Negative for shortness of breath.   Cardiovascular:  Negative for chest pain.  Gastrointestinal:  Positive for abdominal pain, constipation, diarrhea and nausea. Negative for vomiting.  Musculoskeletal: Negative.   Skin: Negative.   Psychiatric/Behavioral:  Negative for confusion.     Physical Exam Updated Vital Signs BP (!) 191/116   Pulse 62   Temp 97.9 F (36.6 C)   Resp 16   SpO2 97%  Physical Exam Constitutional:      Appearance: He is well-developed.  HENT:     Head: Normocephalic and atraumatic.  Eyes:     Pupils: Pupils are equal, round, and reactive to light.  Cardiovascular:     Rate and Rhythm: Normal rate and regular rhythm.     Heart sounds: Murmur heard.  Pulmonary:     Effort: Pulmonary effort is normal. No respiratory distress.     Breath sounds: Normal breath sounds. No wheezing or rales.  Chest:      Chest wall: No tenderness.  Abdominal:     General: Bowel sounds are normal.     Palpations: Abdomen is soft.     Tenderness: There is abdominal tenderness in the periumbilical area and left lower quadrant. There is no guarding or rebound.  Musculoskeletal:        General: Normal range of motion.     Cervical back: Normal range of motion and neck supple.  Lymphadenopathy:     Cervical: No cervical adenopathy.  Skin:    General: Skin is warm and dry.     Findings: No rash.  Neurological:     Mental Status: He is alert and oriented to person, place, and time.     ED Results / Procedures / Treatments   Labs (all labs ordered are listed, but only abnormal results are displayed) Labs Reviewed  COMPREHENSIVE METABOLIC PANEL - Abnormal; Notable for the following components:      Result Value   Glucose, Bld 124 (*)    All other components within normal limits  CBC WITH DIFFERENTIAL/PLATELET - Abnormal; Notable for the following components:   RBC 3.98 (*)    Hemoglobin 11.4 (*)    HCT 35.1 (*)    Lymphs Abs 0.6 (*)    All other components within normal limits  LIPASE, BLOOD    EKG None  Radiology CT ABDOMEN PELVIS WO CONTRAST  Result Date: 01/08/2023 CLINICAL DATA:  Left lower quadrant abdominal pain. EXAM: CT ABDOMEN AND PELVIS WITHOUT CONTRAST TECHNIQUE: Multidetector CT imaging of the abdomen and pelvis was performed following the standard protocol without IV contrast. RADIATION DOSE REDUCTION: This exam was performed according to the departmental dose-optimization program which includes automated exposure control, adjustment of the mA and/or kV according to patient size and/or use of iterative reconstruction technique. COMPARISON:  12/13/2022 FINDINGS: Lower chest: The lung bases are clear of acute process. No pleural effusion or pulmonary lesions. The heart is normal in size. No pericardial effusion. The distal esophagus and aorta are unremarkable. Hepatobiliary: No hepatic  lesions or intrahepatic biliary dilatation. Innumerable small layering gallstones in the gallbladder. No findings for acute cholecystitis. Pancreas: No mass, inflammation or  ductal dilatation. Spleen: Normal size.  No focal lesions. Adrenals/Urinary Tract: The adrenal glands are normal. Stable simple right renal cyst not requiring any further imaging evaluation or follow-up. No renal or obstructing ureteral calculi. Mild irregular bladder wall thickening appears stable. Suspect small cellules and trabeculation. No bladder mass or calculi. Moderate bladder distension. Estimated volume is 600 cc. Stomach/Bowel: The stomach, duodenum and small are unremarkable. Focus of acute diverticulitis involving the upper sigmoid colon with marked wall thickening and pericolonic inflammatory changes. No free air or abscess. Stable underlying diverticulosis. Vascular/Lymphatic: Stable age related atherosclerotic calcification involving the aorta and branch vessels but no aneurysm. No abdominal or pelvic lymphadenopathy. No inguinal adenopathy. Reproductive: Enlarged prostate gland with median lobe hypertrophy impressing on the base of the bladder. The seminal vesicles are unremarkable. Other: Small amount of free pelvic fluid likely related to the diverticulitis. Musculoskeletal: No significant bony findings. IMPRESSION: 1. Focus of acute diverticulitis involving the upper sigmoid colon with marked wall thickening and pericolonic inflammatory changes. No free air or abscess. 2. Cholelithiasis but no findings for acute cholecystitis. 3. Enlarged prostate gland with median lobe hypertrophy impressing on the base of the bladder. 4. Stable mild irregular bladder wall thickening. Suspect small cellules and trabeculation. No bladder mass or calculi. Moderate bladder distension. 5. Aortic atherosclerosis. Aortic Atherosclerosis (ICD10-I70.0). Electronically Signed   By: Rudie Meyer M.D.   On: 01/08/2023 13:05    Procedures Procedures     Medications Ordered in ED Medications  metoprolol tartrate (LOPRESSOR) tablet 50 mg (has no administration in time range)  piperacillin-tazobactam (ZOSYN) IVPB 3.375 g (0 g Intravenous Stopped 01/08/23 1544)    ED Course/ Medical Decision Making/ A&P                                 Medical Decision Making Amount and/or Complexity of Data Reviewed Labs: ordered. Radiology: ordered.  Risk Prescription drug management.   Patient is a 88 year old male who presents with abdominal pain.  Labs show normal WBC count.  Otherwise nonconcerning.  CT scan shows evidence of wall thickening consistent with diverticulitis.  Given that he has already had a round of Augmentin and he has been on 24 hours of Cipro/Flagyl, will plan admission for IV antibiotics for failure of outpatient treatment.  He was given some IV Zosyn.  I did speak with Dr. Lavon Paganini with gastroenterology who will follow the patient in the hospital.  I spoke with Dr. Janee Morn who will admit the patient.  His blood pressure is elevated but he does not think that he took his medication this morning.  He is asymptomatic.  Will give him an oral dose of his metoprolol.  Final Clinical Impression(s) / ED Diagnoses Final diagnoses:  Diverticulitis  Elevated blood pressure reading    Rx / DC Orders ED Discharge Orders     None         Rolan Bucco, MD 01/08/23 1548

## 2023-01-09 DIAGNOSIS — K5792 Diverticulitis of intestine, part unspecified, without perforation or abscess without bleeding: Secondary | ICD-10-CM | POA: Diagnosis not present

## 2023-01-09 DIAGNOSIS — R935 Abnormal findings on diagnostic imaging of other abdominal regions, including retroperitoneum: Secondary | ICD-10-CM

## 2023-01-09 LAB — CBC
HCT: 32 % — ABNORMAL LOW (ref 39.0–52.0)
Hemoglobin: 10.4 g/dL — ABNORMAL LOW (ref 13.0–17.0)
MCH: 29.6 pg (ref 26.0–34.0)
MCHC: 32.5 g/dL (ref 30.0–36.0)
MCV: 91.2 fL (ref 80.0–100.0)
Platelets: 190 10*3/uL (ref 150–400)
RBC: 3.51 MIL/uL — ABNORMAL LOW (ref 4.22–5.81)
RDW: 15.2 % (ref 11.5–15.5)
WBC: 6.7 10*3/uL (ref 4.0–10.5)
nRBC: 0 % (ref 0.0–0.2)

## 2023-01-09 LAB — BASIC METABOLIC PANEL
Anion gap: 8 (ref 5–15)
BUN: 10 mg/dL (ref 8–23)
CO2: 28 mmol/L (ref 22–32)
Calcium: 8.8 mg/dL — ABNORMAL LOW (ref 8.9–10.3)
Chloride: 102 mmol/L (ref 98–111)
Creatinine, Ser: 0.96 mg/dL (ref 0.61–1.24)
GFR, Estimated: >60 mL/min (ref >60)
Glucose, Bld: 117 mg/dL — ABNORMAL HIGH (ref 70–99)
Potassium: 3.6 mmol/L (ref 3.5–5.1)
Sodium: 138 mmol/L (ref 135–145)

## 2023-01-09 NOTE — Consult Note (Signed)
[facility] CONSULTATION NOTE Requesting: [facility] hospitalist Primary GI: Dr. Lavon Paganini Reason for consultation: Persistent/recurrent diverticulitis   HISTORY OF PRESENT ILLNESS:  Kevin Kass, [name] is a 88 y.o. male, retired [employer], who admitted to the hospital with recalcitrant diverticulitis.  The patient has a long history of recurrent diverticulitis.  Multiple CT scans.  He contacted Dr. Lavon Paganini night before last with acute abdominal pain.  Treated with Cipro and Flagyl.  Worsening pain.  Came to the hospital.  CT scan shows uncomplicated sigmoid diverticulitis.  Blood cell count 6.7.  Hemoglobin 10.4.  Last colonoscopy greater than 10 years ago with Dr. Kinnie Scales.  He states that he is feeling better today.  His wife is at the bedside.  REVIEW OF SYSTEMS:  All non-GI ROS negative. Past Medical History:  Diagnosis Date   Aortic stenosis    moderate aortic stenosis   Arthritis    Benign prostatic hypertrophy    Chronic systolic heart failure (HCC)    Chronotropic incompetence with sinus node dysfunction    Status post Guidant dual-mode, dual-pacing, dual-sensing  pacemaker   implantation now programmed to AAI with recent generator change.   Coronary artery disease    status post multiple prior percutaneous coronary interventions, microvascular angina per Dr [name]   Diverticulitis sigmoid colon recurrent    Dysfunctional autonomic nervous system    Dyspnea    Heart murmur    History of primary hypertension    Hypercoagulable state (HCC)    chronically anticoagulated with coumadin   Hyperlipidemia    Hyperthyroidism    Hypothyroidism    Dr. [name]   MGUS (monoclonal gammopathy of unknown significance) 02/17/2013   Ocular myasthenia (HCC)    Osteoarthritis    Paroxysmal atrial fibrillation (HCC)    DR. Riley Kill,    Prediabetes 09/21/2017   Stroke (HCC)    1990    Past Surgical History:  Procedure Laterality Date   AORTIC VALVE REPLACEMENT  03/15/2011    Procedure: AORTIC VALVE REPLACEMENT (AVR);  Surgeon: Alleen Borne, [name];  Location: [facility] OR;  Service: Open Heart Surgery;  Laterality: N/A;   APPENDECTOMY     CARDIAC CATHETERIZATION     13   CARDIOVERSION     CARDIOVERSION  04/15/2011   Procedure: CARDIOVERSION;  Surgeon: Marca Ancona, [name];  Location: [facility] ENDOSCOPY;  Service: Cardiovascular;  Laterality: N/A;   CARDIOVERSION N/A 09/11/2014   Procedure: CARDIOVERSION;  Surgeon: Quintella Reichert, [name];  Location: [facility] ENDOSCOPY;  Service: Cardiovascular;  Laterality: N/A;   CARDIOVERSION N/A 06/27/2015   Procedure: CARDIOVERSION;  Surgeon: Vesta Mixer, [name];  Location: [facility] ENDOSCOPY;  Service: Cardiovascular;  Laterality: N/A;   CARDIOVERSION N/A 07/04/2015   Procedure: CARDIOVERSION;  Surgeon: Marinus Maw, [name];  Location: [facility] ENDOSCOPY;  Service: Cardiovascular;  Laterality: N/A;   CARDIOVERSION N/A 04/13/2017   Procedure: CARDIOVERSION;  Surgeon: Quintella Reichert, [name];  Location: [facility] ENDOSCOPY;  Service: Cardiovascular;  Laterality: N/A;   COLONOSCOPY     CORONARY STENT INTERVENTION N/A 10/15/2019   Procedure: CORONARY STENT INTERVENTION;  Surgeon: Corky Crafts, [name];  Location: [facility] INVASIVE CV LAB;  Service: Cardiovascular;  Laterality: N/A;   EP IMPLANTABLE DEVICE N/A 06/16/2015   Procedure: Pacemaker Implant;  Surgeon: Marinus Maw, [name];  Location: [facility] INVASIVE CV LAB;  Service: Cardiovascular;  Laterality: N/A;   hemrrhoidectomy     LEFT AND RIGHT HEART CATHETERIZATION WITH CORONARY ANGIOGRAM Bilateral 02/01/2011   Procedure: LEFT AND RIGHT HEART CATHETERIZATION WITH CORONARY ANGIOGRAM;  Surgeon:  Herby Abraham, [name];  Location: [facility] CATH LAB;  Service: Cardiovascular;  Laterality: Bilateral;   LEFT HEART CATH AND CORONARY ANGIOGRAPHY N/A 10/15/2019   Procedure: LEFT HEART CATH AND CORONARY ANGIOGRAPHY;  Surgeon: Corky Crafts, [name];  Location: [facility] INVASIVE CV LAB;  Service: Cardiovascular;  Laterality: N/A;   LEFT HEART CATH AND  CORONARY ANGIOGRAPHY N/A 10/07/2020   Procedure: LEFT HEART CATH AND CORONARY ANGIOGRAPHY;  Surgeon: Tonny Bollman, [name];  Location: [facility] INVASIVE CV LAB;  Service: Cardiovascular;  Laterality: N/A;   LEFT HEART CATH AND CORONARY ANGIOGRAPHY N/A 07/06/2022   Procedure: LEFT HEART CATH AND CORONARY ANGIOGRAPHY;  Surgeon: Tonny Bollman, [name];  Location: [facility] INVASIVE CV LAB;  Service: Cardiovascular;  Laterality: N/A;   LUMBAR LAMINECTOMY/DECOMPRESSION MICRODISCECTOMY Right 02/23/2021   Procedure: Right Lumbar four-five microdiscectomy;  Surgeon: Tia Alert, [name];  Location: [facility] OR;  Service: Neurosurgery;  Laterality: Right;   MAZE  03/15/2011   Procedure: MAZE;  Surgeon: Alleen Borne, [name];  Location: [facility] OR;  Service: Open Heart Surgery;  Laterality: N/A;   MOHS SURGERY  09/2021   PACEMAKER INSERTION  1991   Guidant PPM, most recent Generator Change by Dr [name] was 08/22/06   RIGHT/LEFT HEART CATH AND CORONARY ANGIOGRAPHY N/A 07/06/2016   Procedure: Right/Left Heart Cath and Coronary Angiography;  Surgeon: Tonny Bollman, [name];  Location: [facility] INVASIVE CV LAB;  Service: Cardiovascular;  Laterality: N/A;   TEE WITHOUT CARDIOVERSION  04/15/2011   Procedure: TRANSESOPHAGEAL ECHOCARDIOGRAM (TEE);  Surgeon: Marca Ancona, [name];  Location: [facility] ENDOSCOPY;  Service: Cardiovascular;  Laterality: N/A;   TEE WITHOUT CARDIOVERSION N/A 09/11/2014   Procedure: TRANSESOPHAGEAL ECHOCARDIOGRAM (TEE);  Surgeon: Quintella Reichert, [name];  Location: [facility] ENDOSCOPY;  Service: Cardiovascular;  Laterality: N/A;    Social History Kevin Kass, [name]  reports that he has never smoked. He has never used smokeless tobacco. He reports that he does not drink alcohol and does not use drugs.  family history includes Anorexia nervosa in his daughter; Atrial fibrillation in his brother; CAD in his brother; Depression in his daughter; Heart disease in his brother; Hypertension in his son; Sarcoidosis in his brother.  Allergies  Allergen  Reactions   Contrast Media [Iodinated Contrast Media] Hives   Gadolinium Derivatives Hives   Iodine-131 Hives   Metrizamide Hives   Tetanus Toxoids     Redness        PHYSICAL EXAMINATION: Vital signs: BP (!) 178/88 (BP Location: Left Arm)   Pulse 60   Temp 98.2 F (36.8 C) (Oral)   Resp 14   Ht 6\' 1"  (1.854 m)   Wt 92.9 kg   SpO2 96%   BMI 27.02 kg/m   Constitutional: generally well-appearing, no acute distress Psychiatric: alert and oriented x3, cooperative Eyes: extraocular movements intact, anicteric, conjunctiva pink Mouth: oral pharynx moist, no lesions Neck: supple no lymphadenopathy Cardiovascular: heart regular rate and rhythm, systolic murmur Lungs: clear to auscultation bilaterally Abdomen: soft, mild left lower quadrant tenderness, nondistended, no obvious ascites, no peritoneal signs, normal bowel sounds, no organomegaly Rectal: Omitted Extremities: no clubbing, cyanosis, or lower extremity edema bilaterally Skin: no lesions on visible extremities Neuro: No focal deficits.  Cranial nerves intact  ASSESSMENT:  1.  Acute diverticulitis.  Uncomplicated. 2.  Multiple general medical problems.  Stable   PLAN:  1.  Liquid diet 2.  Continue Zosyn 3.  GI will follow

## 2023-01-09 NOTE — Progress Notes (Signed)
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   Patient has been following with his gastroenterologist Dr. Lavon Paganini for management of chronic constipation as well as recent diagnosis of diverticulitis.  He had a CT scan on 10/14 which showed descending and sigmoid colonic diverticulosis with chronic versus acute inflammation in the proximal sigmoid colon.  He was put on a course of Augmentin which he completed with some improvement however symptoms returned.  He was started on a course of Cipro/Flagyl 2 days ago.   Despite antibiotics he has been having persistent and worsening abdominal pain.  Initially this was localized to the left lower quadrant however now more notable below the umbilical area.   He reports a chronic history of constipation for which he has had adjustments in his medications including Linzess and Movantik.  Last bowel movement was day prior to admission.  He has had some nausea but no emesis.  No fevers, chills, diaphoresis.  He has chronic mild dyspnea with exertion as well as intermittent angina which is not changed from his baseline.  He is on chronic Eliquis and has not seen any obvious bleeding.   [facility] ED Course  Labs/Imaging on admission: I have personally reviewed following labs and imaging studies.   Initial vitals showed BP 161/97, pulse 73, RR 14, temp 97.6 F, SpO2 96% on room air.  BP as high as 206/103.   Labs showed WBC 9.1, hemoglobin 11.4, platelets 209,000, sodium 137, potassium 3.9, bicarb 29, BUN 11, creatinine 0.91,  serum glucose 124, LFTs within normal limits, lipase 12.   CT abdomen/pelvis without contrast showed focus of acute diverticulitis involving the upper sigmoid colon with marked wall thickening and pericolonic inflammatory changes.  No free air or abscess.  Cholelithiasis without findings for acute cholecystitis.  Enlarged prostate gland with median lobe hypertrophy impressing on the base of the bladder.  Stable mild irregular bladder wall thickening.  Suspect small cellules and trabeculation.  No bladder mass or calculi.  Moderate bladder distention.   Patient was given IV Zosyn and oral Lopressor 50 mg.  EDP spoke with GI Dr. Lavon Paganini whose team will follow in hospital.  The hospitalist service was consulted to admit for further evaluation and management.  Assessment & Plan:   Principal Problem:   Acute diverticulitis Active Problems:   Persistent atrial fibrillation (HCC)   Chronic heart failure with preserved ejection fraction (HFpEF) (HCC)   Mixed hyperlipidemia   Coronary artery disease of native artery of native heart with stable angina pectoris (HCC)   S/P AVR (aortic valve replacement)   IgM monoclonal gammopathy of uncertain significance   History of pacemaker   Essential hypertension   Hypertensive urgency   BPH (benign prostatic hyperplasia)   Cholelithiasis  Acute sigmoid diverticulitis: CT shows acute sigmoid diverticulitis without evidence of free air or abscess.  Patient failed outpatient antibiotics.  Improving, pain 4 out of 10.  Tolerating diet.  Mild nausea.  Will advance to full liquid diet now and then to soft diet later if tolerated.  Continue Zosyn.   Paroxysmal A-fib/flutter Sinus  node dysfunction s/p PPM: Stable.  Continue Toprol-XL, digoxin, Eliquis.   Chronic HFpEF: Stable, appears euvolemic on admission.  Continue Toprol-XL.   Coronary artery disease s/p PCI: Stable, denies active chest pain.  Continue Toprol-XL, Pletal, Eliquis, rosuvastatin, Zetia.    MGUS: Follows with heme-onc Dr. Truett Perna, currently under observation.  Stable mild anemia noted.   Osteoarthritis: On chronic prednisone 5 mg daily, continue.   Hypertension: Continue Toprol-XL.  Patient reports history of occasional orthostasis and takes midodrine as needed.   Hyperlipidemia: Continue rosuvastatin and Zetia.   BPH: Continue Cardura.   Cholelithiasis: Noted on CT without evidence of acute cholecystitis.   Chronic pain: Continue home Norco and Fioricet.   Anxiety: Continue home Ativan.   Aortic stenosis s/p bioprosthetic AVR  DVT prophylaxis: Eliquis   Code Status: Full Code  Family Communication: Caretaker present at bedside.  Plan of care discussed with patient in length and he/she verbalized understanding and agreed with it.  Status is: Inpatient Remains inpatient appropriate because: Gradually improving.  Need to advance diet slowly.   Estimated body mass index is 27.02 kg/m as calculated from the following:   Height as of this encounter: 6\' 1"  (1.854 m).   Weight as of this encounter: 92.9 kg.    Nutritional Assessment: Body mass index is 27.02 kg/m.Marland Kitchen Seen by dietician.  I agree with the assessment and plan as outlined below: Nutrition Status:        . Skin Assessment: I have examined the patient's skin and I agree with the wound assessment as performed by the wound care RN as outlined below:    Consultants:  GI  Procedures:  None  Antimicrobials:  Anti-infectives (From admission, onward)    Start     Dose/Rate Route Frequency Ordered Stop   01/08/23 2200  piperacillin-tazobactam (ZOSYN) IVPB 3.375 g        3.375 g 12.5 mL/hr over 240 Minutes Intravenous Every 8 hours 01/08/23 1931     01/08/23 1415  piperacillin-tazobactam (ZOSYN) IVPB 3.375 g        3.375 g 100 mL/hr over 30 Minutes Intravenous  Once 01/08/23 1405 01/08/23 1544         Subjective: Patient seen and examined, caretaker at the bedside.  His pain is  improved.  Mild nausea and tolerating liquid diet.  Agreeable with advancing diet.  Objective: Vitals:   01/08/23 2047 01/09/23 0207 01/09/23 0529 01/09/23 1238  BP: (!) 181/94 (!) 167/92 (!) 178/88 (!) 180/91  Pulse: 61 (!) 58 60 (!) 58  Resp:  14  20  Temp: 98.3 F (36.8 C) 98.3 F (36.8 C) 98.2 F (36.8 C) 97.9 F (36.6 C)  TempSrc: Oral Oral Oral Oral  SpO2: 95% 99% 96% 96%  Weight:      Height:        Intake/Output Summary (Last 24 hours) at 01/09/2023 1248 Last data filed at 01/09/2023 0510 Gross per 24 hour  Intake 1186.55 ml  Output 450 ml  Net 736.55 ml   Filed Weights   01/08/23 1808  Weight: 92.9 kg    Examination:  General exam: Appears calm and comfortable  Respiratory system: Clear to auscultation. Respiratory effort normal. Cardiovascular system: S1 & S2 heard, RRR. No JVD, murmurs, rubs, gallops or clicks. No pedal edema. Gastrointestinal system: Abdomen is nondistended, soft and very mild periumbilical tenderness. No organomegaly or masses felt. Normal bowel sounds heard. Central nervous system: Alert and oriented. No focal neurological deficits. Extremities: Symmetric 5 x 5 power. Skin:  No rashes, lesions or ulcers Psychiatry: Judgement and insight appear normal. Mood & affect appropriate.    Data Reviewed: I have personally reviewed following labs and imaging studies  CBC: Recent Labs  Lab 01/08/23 1240 01/09/23 0535  WBC 9.1 6.7  NEUTROABS 7.6  --   HGB 11.4* 10.4*  HCT 35.1* 32.0*  MCV 88.2 91.2  PLT 209 190   Basic Metabolic Panel: Recent Labs  Lab 01/08/23 1240 01/09/23 0535  NA 137 138  K 3.9 3.6  CL 100 102  CO2 29 28  GLUCOSE 124* 117*  BUN 11 10  CREATININE 0.91 0.96  CALCIUM 9.6 8.8*   GFR: Estimated Creatinine Clearance: 62.4 mL/min (by C-G formula based on SCr of 0.96 mg/dL). Liver Function Tests: Recent Labs  Lab 01/08/23 1240  AST 17  ALT 15  ALKPHOS 114  BILITOT 0.7  PROT 8.0  ALBUMIN 4.1   Recent  Labs  Lab 01/08/23 1240  LIPASE 12   No results for input(s): "AMMONIA" in the last 168 hours. Coagulation Profile: No results for input(s): "INR", "PROTIME" in the last 168 hours. Cardiac Enzymes: No results for input(s): "CKTOTAL", "CKMB", "CKMBINDEX", "TROPONINI" in the last 168 hours. BNP (last 3 results) No results for input(s): "PROBNP" in the last 8760 hours. HbA1C: No results for input(s): "HGBA1C" in the last 72 hours. CBG: No results for input(s): "GLUCAP" in the last 168 hours. Lipid Profile: No results for input(s): "CHOL", "HDL", "LDLCALC", "TRIG", "CHOLHDL", "LDLDIRECT" in the last 72 hours. Thyroid Function Tests: No results for input(s): "TSH", "T4TOTAL", "FREET4", "T3FREE", "THYROIDAB" in the last 72 hours. Anemia Panel: No results for input(s): "VITAMINB12", "FOLATE", "FERRITIN", "TIBC", "IRON", "RETICCTPCT" in the last 72 hours. Sepsis Labs: No results for input(s): "PROCALCITON", "LATICACIDVEN" in the last 168 hours.  No results found for this or any previous visit (from the past 240 hour(s)).   Radiology Studies: CT ABDOMEN PELVIS WO CONTRAST  Result Date: 01/08/2023 CLINICAL DATA:  Left lower quadrant abdominal pain. EXAM: CT ABDOMEN AND PELVIS WITHOUT CONTRAST TECHNIQUE: Multidetector CT imaging of the abdomen and pelvis was performed following the standard protocol without IV contrast. RADIATION DOSE REDUCTION: This exam was performed according to the departmental dose-optimization program which includes automated exposure control, adjustment of the mA and/or kV according to patient size and/or use of iterative reconstruction technique. COMPARISON:  12/13/2022 FINDINGS: Lower chest: The lung bases are clear of acute process. No pleural effusion or pulmonary lesions. The heart is normal in size. No pericardial effusion. The distal esophagus and aorta are unremarkable. Hepatobiliary: No hepatic lesions or intrahepatic biliary dilatation. Innumerable small layering  gallstones in the gallbladder. No findings for acute cholecystitis. Pancreas: No mass, inflammation or ductal dilatation. Spleen: Normal size.  No focal lesions. Adrenals/Urinary Tract: The adrenal glands are normal. Stable simple right renal cyst not requiring any further imaging evaluation or follow-up. No renal or obstructing ureteral calculi. Mild irregular bladder wall thickening appears stable. Suspect small cellules and trabeculation. No bladder mass or calculi. Moderate bladder distension. Estimated volume is 600 cc. Stomach/Bowel: The stomach, duodenum and small are unremarkable. Focus of acute diverticulitis involving the upper sigmoid colon with marked wall thickening and pericolonic inflammatory changes. No free air or abscess. Stable underlying diverticulosis. Vascular/Lymphatic: Stable age related atherosclerotic calcification involving the aorta and branch vessels but no aneurysm. No abdominal or pelvic lymphadenopathy. No inguinal adenopathy. Reproductive: Enlarged prostate gland with median lobe hypertrophy impressing on the base of the bladder. The seminal vesicles are unremarkable. Other:  Small amount of free pelvic fluid likely related to the diverticulitis. Musculoskeletal: No significant bony findings. IMPRESSION: 1. Focus of acute diverticulitis involving the upper sigmoid colon with marked wall thickening and pericolonic inflammatory changes. No free air or abscess. 2. Cholelithiasis but no findings for acute cholecystitis. 3. Enlarged prostate gland with median lobe hypertrophy impressing on the base of the bladder. 4. Stable mild irregular bladder wall thickening. Suspect small cellules and trabeculation. No bladder mass or calculi. Moderate bladder distension. 5. Aortic atherosclerosis. Aortic Atherosclerosis (ICD10-I70.0). Electronically Signed   By: Rudie Meyer M.D.   On: 01/08/2023 13:05    Scheduled Meds:  apixaban  5 mg Oral BID   butalbital-acetaminophen-caffeine  1 tablet Oral  TID   cilostazol  100 mg Oral BID   digoxin  0.125 mg Oral Daily   doxazosin  4 mg Oral Daily   ezetimibe  10 mg Oral Daily   HYDROcodone-acetaminophen  1 tablet Oral TID   LORazepam  1 mg Oral QHS   magnesium oxide  400 mg Oral Daily   metoprolol succinate  50 mg Oral BID   pantoprazole  40 mg Oral Daily   predniSONE  5 mg Oral Q0600   rosuvastatin  20 mg Oral QHS   sodium chloride flush  3 mL Intravenous Q12H   Continuous Infusions:  piperacillin-tazobactam (ZOSYN)  IV 3.375 g (01/09/23 6962)     LOS: 1 day   Hughie Closs, MD [facility]  01/09/2023, 12:48 PM   *Please note that this is a verbal dictation therefore any spelling or grammatical errors are due to the "[employer]agon [employer] One" system interpretation.  Please page via Amion and do not message via secure chat for urgent patient care matters. Secure chat can be used for non urgent patient care matters.  How to contact the [facility] Attending or Consulting provider 7A - 7P or covering provider during after hours 7P -7A, for this patient?  Check the care team in [facility] and look for a) attending/consulting [facility] provider listed and b) the [facility] team listed. Page or secure chat 7A-7P. Log into [email or web address] and use [facility] universal password to access. If you do not have the password, please contact the hospital operator. Locate the [facility] provider you are looking for under [facility] and page to a number that you can be directly reached. If you still have difficulty reaching the provider, please page the [facility] (Director on Call) for the Hospitalists listed on amion for assistance.

## 2023-01-09 NOTE — Plan of Care (Signed)

## 2023-01-10 DIAGNOSIS — D649 Anemia, unspecified: Secondary | ICD-10-CM

## 2023-01-10 DIAGNOSIS — R1032 Left lower quadrant pain: Secondary | ICD-10-CM

## 2023-01-10 DIAGNOSIS — K5792 Diverticulitis of intestine, part unspecified, without perforation or abscess without bleeding: Secondary | ICD-10-CM | POA: Diagnosis not present

## 2023-01-10 LAB — BASIC METABOLIC PANEL
Anion gap: 7 (ref 5–15)
BUN: 10 mg/dL (ref 8–23)
CO2: 26 mmol/L (ref 22–32)
Calcium: 8.7 mg/dL — ABNORMAL LOW (ref 8.9–10.3)
Chloride: 105 mmol/L (ref 98–111)
Creatinine, Ser: 0.95 mg/dL (ref 0.61–1.24)
GFR, Estimated: >60 mL/min (ref >60)
Glucose, Bld: 128 mg/dL — ABNORMAL HIGH (ref 70–99)
Potassium: 3.4 mmol/L — ABNORMAL LOW (ref 3.5–5.1)
Sodium: 138 mmol/L (ref 135–145)

## 2023-01-10 LAB — CBC WITH DIFFERENTIAL/PLATELET
Abs Immature Granulocytes: 0.01 10*3/uL (ref 0.00–0.07)
Basophils Absolute: 0 10*3/uL (ref 0.0–0.1)
Basophils Relative: 1 %
Eosinophils Absolute: 0.4 10*3/uL (ref 0.0–0.5)
Eosinophils Relative: 7 %
HCT: 31.3 % — ABNORMAL LOW (ref 39.0–52.0)
Hemoglobin: 10.1 g/dL — ABNORMAL LOW (ref 13.0–17.0)
Immature Granulocytes: 0 %
Lymphocytes Relative: 14 %
Lymphs Abs: 0.8 10*3/uL (ref 0.7–4.0)
MCH: 28.9 pg (ref 26.0–34.0)
MCHC: 32.3 g/dL (ref 30.0–36.0)
MCV: 89.7 fL (ref 80.0–100.0)
Monocytes Absolute: 0.5 10*3/uL (ref 0.1–1.0)
Monocytes Relative: 9 %
Neutro Abs: 4.2 10*3/uL (ref 1.7–7.7)
Neutrophils Relative %: 69 %
Platelets: 194 10*3/uL (ref 150–400)
RBC: 3.49 MIL/uL — ABNORMAL LOW (ref 4.22–5.81)
RDW: 15.1 % (ref 11.5–15.5)
WBC: 5.9 10*3/uL (ref 4.0–10.5)
nRBC: 0 % (ref 0.0–0.2)

## 2023-01-10 LAB — C-REACTIVE PROTEIN: CRP: 5.8 mg/dL — ABNORMAL HIGH (ref <1.0)

## 2023-01-10 MED ORDER — VITAMIN D 25 MCG (1000 UNIT) PO TABS
1000.0000 [IU] | ORAL_TABLET | Freq: Every day | ORAL | Status: DC
  Administered 2023-01-10 – 2023-01-14 (×5): 1000 [IU] via ORAL
  Filled 2023-01-10 (×5): qty 1

## 2023-01-10 MED ORDER — BUTALBITAL-APAP-CAFFEINE 50-325-40 MG PO TABS: 1.0000 | ORAL_TABLET | Freq: Three times a day (TID) | ORAL | Status: DC | PRN

## 2023-01-10 MED ORDER — LINACLOTIDE 145 MCG PO CAPS: 145.0000 ug | ORAL_CAPSULE | Freq: Every day | ORAL | Status: DC

## 2023-01-10 MED ORDER — POLYETHYLENE GLYCOL 3350 17 G PO PACK
17.0000 g | PACK | Freq: Two times a day (BID) | ORAL | Status: DC
  Administered 2023-01-10 – 2023-01-13 (×4): 17 g via ORAL
  Filled 2023-01-10 (×6): qty 1

## 2023-01-10 MED ORDER — POTASSIUM CHLORIDE CRYS ER 20 MEQ PO TBCR
20.0000 meq | EXTENDED_RELEASE_TABLET | Freq: Every day | ORAL | Status: DC
  Administered 2023-01-10 – 2023-01-13 (×4): 20 meq via ORAL
  Filled 2023-01-10 (×4): qty 1

## 2023-01-10 MED ORDER — LINACLOTIDE 145 MCG PO CAPS
290.0000 ug | ORAL_CAPSULE | Freq: Every day | ORAL | Status: DC
  Administered 2023-01-11 – 2023-01-13 (×3): 290 ug via ORAL
  Filled 2023-01-10 (×4): qty 2

## 2023-01-10 MED ORDER — POTASSIUM CHLORIDE CRYS ER 20 MEQ PO TBCR
40.0000 meq | EXTENDED_RELEASE_TABLET | Freq: Once | ORAL | Status: AC
  Administered 2023-01-10: 40 meq via ORAL
  Filled 2023-01-10: qty 2

## 2023-01-10 MED ORDER — HYDRALAZINE HCL 20 MG/ML IJ SOLN
10.0000 mg | Freq: Four times a day (QID) | INTRAMUSCULAR | Status: DC | PRN
  Administered 2023-01-10 – 2023-01-14 (×5): 10 mg via INTRAVENOUS
  Filled 2023-01-10 (×5): qty 1

## 2023-01-10 NOTE — Plan of Care (Signed)
°  Problem: Clinical Measurements: °Goal: Ability to maintain clinical measurements within normal limits will improve °Outcome: Progressing °  °Problem: Activity: °Goal: Risk for activity intolerance will decrease °Outcome: Progressing °  °Problem: Nutrition: °Goal: Adequate nutrition will be maintained °Outcome: Progressing °  °

## 2023-01-10 NOTE — Progress Notes (Signed)
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   MRN: [id number]    Progress Note   Subjective   Day # 2 CC; acute sigmoid diverticulitis  IV Zosyn  Labs today-WBC 5.9/hemoglobin 10.1/hematocrit 31.3 Potassium 3.4/BUN 10/creatinine 0.95  Sitting up in chair, says he is definitely feeling better, about "60 percent " Better, and rates his pain level at a 4 which is the best it has been in quite a while He is on a regimen of chronic pain medications for severe arthritis has not required any additional pain medication while here Tolerating  a soft diet, has not had a bowel movement in 3 to 4 days-on MiraLAX and Linzess at home.  Feels bloated. He relates that he has been dealing with diverticulitis over the past 5 months and really has not been completely well or without some degree of abdominal discomfort during that whole time.  He has had about 4 courses of oral antibiotics outpatient, has taken Augmentin x 2, and Cipro and Flagyl x 2.   Objective   Vital signs in last 24 hours: Temp:  [97.3 F (36.3 C)-97.9 F (36.6 C)] 97.8 F (36.6 C) (11/11 0440) Pulse Rate:  [58-60] 60 (11/11 0532) Resp:  [20] 20 (11/10 1238) BP: (147-180)/(77-92) 147/77 (11/11 0532) SpO2:  [95 %-97 %] 97 % (11/11 0440) Weight:  [91.7 kg] 91.7 kg (11/11 0525) Last BM Date : 01/08/23 General:    Elderly white male in NAD, sitting up in chair Heart:  irRegular rate and rhythm; no murmurs Lungs: Respirations even and unlabored, lungs CTA bilaterally Abdomen:  Soft, he is tender in the left lower quadrant and suprapubic area no guarding or rebound, nondistended. Normal bowel sounds. Extremities:  Without edema. Neurologic:  Alert and oriented,  grossly normal neurologically. Psych:  Cooperative. Normal mood and affect.  Intake/Output from previous day: No intake/output data recorded. Intake/Output this shift: No intake/output data recorded.  Lab Results: Recent Labs    01/08/23 1240  01/09/23 0535 01/10/23 0701  WBC 9.1 6.7 5.9  HGB 11.4* 10.4* 10.1*  HCT 35.1* 32.0* 31.3*  PLT 209 190 194   BMET Recent Labs    01/08/23 1240 01/09/23 0535 01/10/23 0701  NA 137 138 138  K 3.9 3.6 3.4*  CL 100 102 105  CO2 29 28 26   GLUCOSE 124* 117* 128*  BUN 11 10 10   CREATININE 0.91 0.96 0.95  CALCIUM 9.6 8.8* 8.7*   LFT Recent Labs    01/08/23 1240  PROT 8.0  ALBUMIN 4.1  AST 17  ALT 15  ALKPHOS 114  BILITOT 0.7   PT/INR No results for input(s): "LABPROT", "INR" in the last 72 hours.  Studies/Results: CT ABDOMEN PELVIS WO CONTRAST  Result Date: 01/08/2023 CLINICAL DATA:  Left lower quadrant abdominal pain. EXAM: CT ABDOMEN AND PELVIS WITHOUT CONTRAST TECHNIQUE: Multidetector CT imaging of the abdomen and pelvis was performed following the standard protocol without IV contrast. RADIATION DOSE REDUCTION: This exam was performed according to the departmental dose-optimization program which includes automated exposure control, adjustment of the mA and/or kV according to patient size and/or use of iterative reconstruction technique. COMPARISON:  12/13/2022 FINDINGS: Lower chest: The lung bases are clear of acute process. No pleural effusion or pulmonary lesions. The heart is normal in size. No pericardial effusion. The distal esophagus and aorta are unremarkable. Hepatobiliary: No hepatic lesions or intrahepatic biliary dilatation. Innumerable small layering gallstones in the gallbladder. No findings for acute cholecystitis. Pancreas:  No mass, inflammation or ductal dilatation. Spleen: Normal size.  No focal lesions. Adrenals/Urinary Tract: The adrenal glands are normal. Stable simple right renal cyst not requiring any further imaging evaluation or follow-up. No renal or obstructing ureteral calculi. Mild irregular bladder wall thickening appears stable. Suspect small cellules and trabeculation. No bladder mass or calculi. Moderate bladder distension. Estimated volume is 600  cc. Stomach/Bowel: The stomach, duodenum and small are unremarkable. Focus of acute diverticulitis involving the upper sigmoid colon with marked wall thickening and pericolonic inflammatory changes. No free air or abscess. Stable underlying diverticulosis. Vascular/Lymphatic: Stable age related atherosclerotic calcification involving the aorta and branch vessels but no aneurysm. No abdominal or pelvic lymphadenopathy. No inguinal adenopathy. Reproductive: Enlarged prostate gland with median lobe hypertrophy impressing on the base of the bladder. The seminal vesicles are unremarkable. Other: Small amount of free pelvic fluid likely related to the diverticulitis. Musculoskeletal: No significant bony findings. IMPRESSION: 1. Focus of acute diverticulitis involving the upper sigmoid colon with marked wall thickening and pericolonic inflammatory changes. No free air or abscess. 2. Cholelithiasis but no findings for acute cholecystitis. 3. Enlarged prostate gland with median lobe hypertrophy impressing on the base of the bladder. 4. Stable mild irregular bladder wall thickening. Suspect small cellules and trabeculation. No bladder mass or calculi. Moderate bladder distension. 5. Aortic atherosclerosis. Aortic Atherosclerosis (ICD10-I70.0). Electronically Signed   By: Rudie Meyer M.D.   On: 01/08/2023 13:05       Assessment / Plan:    #63 88 year old white male with what sounds like smoldering/somewhat refractory diverticulitis over the past 4 to 5 months, requiring multiple courses of oral antibiotics with improvement but not complete resolution of symptoms. Admitted now with acute more severe abdominal pain and CT imaging shows significant upper sigmoid acute diverticulitis, no evidence of abscess or perforation  He has had definite improvement on IV Zosyn day #2  #2 chronic anticoagulation-on Eliquis #3 persistent A-fib/flutter #4.  Coronary artery disease status post multiple stents #5.  Aortic stenosis  status post bioprosthetic aortic valve replacement #6.  Heart failure with preserved EF #7.  Status post pacemaker-sinus node dysfunction #8.  MGUS- #9.  Osteoarthritis on chronic low-dose prednisone #10.  Hypertension #11.  BPH #12 chronic anemia  Plan; continue soft diet Continue IV Zosyn-plan for longer course of IV Zosyn at least 5 days inpatient if not 7-as he has failed several courses of oral antibiotics  He is a very poor surgical candidate and it does not sound as if he would agree to surgery.  Restart MiraLAX twice daily and Linzess to 90 daily  Check CRP, repeat CBC in a.m.      Principal Problem:   Acute diverticulitis Active Problems:   Mixed hyperlipidemia   Coronary artery disease of native artery of native heart with stable angina pectoris (HCC)   S/P AVR (aortic valve replacement)   IgM monoclonal gammopathy of uncertain significance   History of pacemaker   Persistent atrial fibrillation (HCC)   Essential hypertension   Hypertensive urgency   Chronic heart failure with preserved ejection fraction (HFpEF) (HCC)   BPH (benign prostatic hyperplasia)   Cholelithiasis     LOS: 2 days   Jerritt Cardoza PA-C 01/10/2023, 9:02 AM

## 2023-01-10 NOTE — Progress Notes (Signed)
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   Patient has been following with his gastroenterologist Dr. Lavon Paganini for management of chronic constipation as well as recent diagnosis of diverticulitis.  He had a CT scan on 10/14 which showed descending and sigmoid colonic diverticulosis with chronic versus acute inflammation in the proximal sigmoid colon.  He was put on a course of Augmentin which he completed with some improvement however symptoms returned.  He was started on a course of Cipro/Flagyl.  All in all, patient failed to, 10-day course of Augmentin and 110-day course of ciprofloxacin and Flagyl as outpatient and despite of that, had persistent symptoms so he was admitted under hospitalist service for management of acute diverticulitis.  Assessment & Plan:   Principal Problem:   Acute diverticulitis Active Problems:   Persistent atrial fibrillation (HCC)   Chronic heart failure with preserved ejection fraction (HFpEF) (HCC)   Mixed hyperlipidemia   Coronary artery disease of native artery of native heart with stable angina pectoris (HCC)   S/P AVR (aortic valve replacement)   IgM monoclonal gammopathy of uncertain significance   History of pacemaker   Essential hypertension   Hypertensive urgency   BPH (benign prostatic hyperplasia)   Cholelithiasis  Acute sigmoid diverticulitis: CT shows acute sigmoid diverticulitis without evidence of free air or abscess.  Patient failed outpatient antibiotics.  Pain likely improved, 3 out of 10.  Tolerating soft diet.  No  nausea.  GI following.  Due to failure to heal despite of multiple outpatient p.o. antibiotic courses, GI is recommending to keep inpatient for at least 5 days with IV antibiotics.   Paroxysmal A-fib/flutter Sinus node dysfunction s/p PPM: Stable.  Continue Toprol-XL, digoxin, Eliquis.  Hypokalemia: Will replenish.  Resume PTA p.o. potassium.   Chronic HFpEF: Stable, appears euvolemic on admission.  Continue Toprol-XL.   Coronary artery disease s/p PCI: Stable, denies active chest pain.  Continue Toprol-XL, Pletal, Eliquis, rosuvastatin, Zetia.   MGUS: Follows with heme-onc Dr. Truett Perna, currently under observation.  Stable mild anemia noted.   Osteoarthritis: On chronic prednisone 5 mg daily, continue.   Hypertension: Continue Toprol-XL.  Patient reports history of occasional orthostasis and takes midodrine as needed.   Hyperlipidemia: Continue rosuvastatin and Zetia.   BPH: Continue Cardura.   Cholelithiasis: Noted on CT without evidence of acute cholecystitis.   Chronic pain: Continue home Norco and Fioricet.   Anxiety: Continue home Ativan.   Aortic stenosis s/p bioprosthetic AVR  DVT prophylaxis: Eliquis   Code Status: Full Code  Family Communication: Caretaker present at bedside.  Plan of care discussed with patient in length and he/she verbalized understanding and agreed with it.  Status is: Inpatient Remains inpatient appropriate because: Gradually improving.  Will be discharged when cleared by GI.   Estimated body mass index is 26.67 kg/m as calculated from the following:   Height as of this encounter: 6\' 1"  (1.854 m).   Weight as of this encounter: 91.7 kg.    Nutritional Assessment: Body mass index is 26.67 kg/m.Marland Kitchen Seen by dietician.  I agree with the assessment and plan as  outlined below: Nutrition Status:        . Skin Assessment: I have examined the patient's skin and I agree with the wound assessment as performed by the wound care RN as  outlined below:    Consultants:  GI  Procedures:  None  Antimicrobials:  Anti-infectives (From admission, onward)    Start     Dose/Rate Route Frequency Ordered Stop   01/08/23 2200  piperacillin-tazobactam (ZOSYN) IVPB 3.375 g        3.375 g 12.5 mL/hr over 240 Minutes Intravenous Every 8 hours 01/08/23 1931     01/08/23 1415  piperacillin-tazobactam (ZOSYN) IVPB 3.375 g        3.375 g 100 mL/hr over 30 Minutes Intravenous  Once 01/08/23 1405 01/08/23 1544         Subjective: Seen and examined.  Caretaker at the bedside.  Pain improving.  No nausea.  Tolerating diet.  Objective: Vitals:   01/10/23 0314 01/10/23 0440 01/10/23 0525 01/10/23 0532  BP: (!) 171/91 (!) 168/92  (!) 147/77  Pulse: 60 60  60  Resp:      Temp:  97.8 F (36.6 C)    TempSrc:  Oral    SpO2:  97%    Weight:   91.7 kg   Height:        Intake/Output Summary (Last 24 hours) at 01/10/2023 1113 Last data filed at 01/10/2023 0900 Gross per 24 hour  Intake 240 ml  Output --  Net 240 ml   Filed Weights   01/08/23 1808 01/10/23 0525  Weight: 92.9 kg 91.7 kg    Examination:  General exam: Appears calm and comfortable  Respiratory system: Clear to auscultation. Respiratory effort normal. Cardiovascular system: S1 & S2 heard, RRR. No JVD, murmurs, rubs, gallops or clicks. No pedal edema. Gastrointestinal system: Abdomen is nondistended, soft and mild periumbilical tenderness. No organomegaly or masses felt. Normal bowel sounds heard. Central nervous system: Alert and oriented. No focal neurological deficits. Extremities: Symmetric 5 x 5 power. Skin: No rashes, lesions or ulcers.  Psychiatry: Judgement and insight appear normal. Mood & affect appropriate.    Data Reviewed: I have personally reviewed following labs and imaging studies  CBC: Recent Labs  Lab 01/08/23 1240 01/09/23 0535 01/10/23 0701  WBC 9.1 6.7 5.9  NEUTROABS 7.6  --  4.2  HGB 11.4* 10.4* 10.1*  HCT 35.1* 32.0* 31.3*   MCV 88.2 91.2 89.7  PLT 209 190 194   Basic Metabolic Panel: Recent Labs  Lab 01/08/23 1240 01/09/23 0535 01/10/23 0701  NA 137 138 138  K 3.9 3.6 3.4*  CL 100 102 105  CO2 29 28 26   GLUCOSE 124* 117* 128*  BUN 11 10 10   CREATININE 0.91 0.96 0.95  CALCIUM 9.6 8.8* 8.7*   GFR: Estimated Creatinine Clearance: 63.1 mL/min (by C-G formula based on SCr of 0.95 mg/dL). Liver Function Tests: Recent Labs  Lab 01/08/23 1240  AST 17  ALT 15  ALKPHOS 114  BILITOT 0.7  PROT 8.0  ALBUMIN 4.1   Recent Labs  Lab 01/08/23 1240  LIPASE 12   No results for input(s): "AMMONIA" in the last 168 hours. Coagulation Profile: No results for input(s): "INR", "PROTIME" in the last 168 hours. Cardiac Enzymes: No results for input(s): "CKTOTAL", "CKMB", "CKMBINDEX", "TROPONINI" in the last 168 hours. BNP (last 3 results) No results for input(s): "PROBNP" in the last 8760 hours. HbA1C: No results for input(s): "HGBA1C" in the last 72 hours. CBG: No results for  input(s): "GLUCAP" in the last 168 hours. Lipid Profile: No results for input(s): "CHOL", "HDL", "LDLCALC", "TRIG", "CHOLHDL", "LDLDIRECT" in the last 72 hours. Thyroid Function Tests: No results for input(s): "TSH", "T4TOTAL", "FREET4", "T3FREE", "THYROIDAB" in the last 72 hours. Anemia Panel: No results for input(s): "VITAMINB12", "FOLATE", "FERRITIN", "TIBC", "IRON", "RETICCTPCT" in the last 72 hours. Sepsis Labs: No results for input(s): "PROCALCITON", "LATICACIDVEN" in the last 168 hours.  No results found for this or any previous visit (from the past 240 hour(s)).   Radiology Studies: CT ABDOMEN PELVIS WO CONTRAST  Result Date: 01/08/2023 CLINICAL DATA:  Left lower quadrant abdominal pain. EXAM: CT ABDOMEN AND PELVIS WITHOUT CONTRAST TECHNIQUE: Multidetector CT imaging of the abdomen and pelvis was performed following the standard protocol without IV contrast. RADIATION DOSE REDUCTION: This exam was performed according to  the departmental dose-optimization program which includes automated exposure control, adjustment of the mA and/or kV according to patient size and/or use of iterative reconstruction technique. COMPARISON:  12/13/2022 FINDINGS: Lower chest: The lung bases are clear of acute process. No pleural effusion or pulmonary lesions. The heart is normal in size. No pericardial effusion. The distal esophagus and aorta are unremarkable. Hepatobiliary: No hepatic lesions or intrahepatic biliary dilatation. Innumerable small layering gallstones in the gallbladder. No findings for acute cholecystitis. Pancreas: No mass, inflammation or ductal dilatation. Spleen: Normal size.  No focal lesions. Adrenals/Urinary Tract: The adrenal glands are normal. Stable simple right renal cyst not requiring any further imaging evaluation or follow-up. No renal or obstructing ureteral calculi. Mild irregular bladder wall thickening appears stable. Suspect small cellules and trabeculation. No bladder mass or calculi. Moderate bladder distension. Estimated volume is 600 cc. Stomach/Bowel: The stomach, duodenum and small are unremarkable. Focus of acute diverticulitis involving the upper sigmoid colon with marked wall thickening and pericolonic inflammatory changes. No free air or abscess. Stable underlying diverticulosis. Vascular/Lymphatic: Stable age related atherosclerotic calcification involving the aorta and branch vessels but no aneurysm. No abdominal or pelvic lymphadenopathy. No inguinal adenopathy. Reproductive: Enlarged prostate gland with median lobe hypertrophy impressing on the base of the bladder. The seminal vesicles are unremarkable. Other: Small amount of free pelvic fluid likely related to the diverticulitis. Musculoskeletal: No significant bony findings. IMPRESSION: 1. Focus of acute diverticulitis involving the upper sigmoid colon with marked wall thickening and pericolonic inflammatory changes. No free air or abscess. 2.  Cholelithiasis but no findings for acute cholecystitis. 3. Enlarged prostate gland with median lobe hypertrophy impressing on the base of the bladder. 4. Stable mild irregular bladder wall thickening. Suspect small cellules and trabeculation. No bladder mass or calculi. Moderate bladder distension. 5. Aortic atherosclerosis. Aortic Atherosclerosis (ICD10-I70.0). Electronically Signed   By: Rudie Meyer M.D.   On: 01/08/2023 13:05    Scheduled Meds:  apixaban  5 mg Oral BID   butalbital-acetaminophen-caffeine  1 tablet Oral TID   cilostazol  100 mg Oral BID   digoxin  0.125 mg Oral Daily   doxazosin  4 mg Oral Daily   ezetimibe  10 mg Oral Daily   HYDROcodone-acetaminophen  1 tablet Oral TID   [START ON 01/11/2023] linaclotide  290 mcg Oral QAC breakfast   LORazepam  1 mg Oral QHS   magnesium oxide  400 mg Oral Daily   metoprolol succinate  50 mg Oral BID   pantoprazole  40 mg Oral Daily   polyethylene glycol  17 g Oral BID   predniSONE  5 mg Oral Q0600   rosuvastatin  20 mg Oral QHS  sodium chloride flush  3 mL Intravenous Q12H   Continuous Infusions:  piperacillin-tazobactam (ZOSYN)  IV 3.375 g (01/10/23 0455)     LOS: 2 days   Hughie Closs, MD [facility]  01/10/2023, 11:13 AM   *Please note that this is a verbal dictation therefore any spelling or grammatical errors are due to the "Dragon Medical One" system interpretation.  Please page via Amion and do not message via secure chat for urgent patient care matters. Secure chat can be used for non urgent patient care matters.  How to contact the [facility] Attending or Consulting provider 7A - 7P or covering provider during after hours 7P -7A, for this patient?  Check the care team in [facility] and look for a) attending/consulting TRH provider listed and b) the [facility] team listed. Page or secure chat 7A-7P. Log into [email or web address] and use [employer]'s universal password to access. If you do not have the password, please contact the  hospital operator. Locate the [facility] provider you are looking for under [facility] and page to a number that you can be directly reached. If you still have difficulty reaching the provider, please page the [facility] (Director on Call) for the Hospitalists listed on amion for assistance.

## 2023-01-11 DIAGNOSIS — R1032 Left lower quadrant pain: Secondary | ICD-10-CM

## 2023-01-11 DIAGNOSIS — K5792 Diverticulitis of intestine, part unspecified, without perforation or abscess without bleeding: Secondary | ICD-10-CM | POA: Diagnosis not present

## 2023-01-11 DIAGNOSIS — D649 Anemia, unspecified: Secondary | ICD-10-CM

## 2023-01-11 LAB — CBC WITH DIFFERENTIAL/PLATELET
Abs Immature Granulocytes: 0.01 10*3/uL (ref 0.00–0.07)
Basophils Absolute: 0 10*3/uL (ref 0.0–0.1)
Basophils Relative: 1 %
Eosinophils Absolute: 0.5 10*3/uL (ref 0.0–0.5)
Eosinophils Relative: 7 %
HCT: 31.3 % — ABNORMAL LOW (ref 39.0–52.0)
Hemoglobin: 9.9 g/dL — ABNORMAL LOW (ref 13.0–17.0)
Immature Granulocytes: 0 %
Lymphocytes Relative: 18 %
Lymphs Abs: 1.2 10*3/uL (ref 0.7–4.0)
MCH: 29.3 pg (ref 26.0–34.0)
MCHC: 31.6 g/dL (ref 30.0–36.0)
MCV: 92.6 fL (ref 80.0–100.0)
Monocytes Absolute: 0.6 10*3/uL (ref 0.1–1.0)
Monocytes Relative: 9 %
Neutro Abs: 4.2 10*3/uL (ref 1.7–7.7)
Neutrophils Relative %: 65 %
Platelets: 235 10*3/uL (ref 150–400)
RBC: 3.38 MIL/uL — ABNORMAL LOW (ref 4.22–5.81)
RDW: 15.1 % (ref 11.5–15.5)
WBC: 6.6 10*3/uL (ref 4.0–10.5)
nRBC: 0 % (ref 0.0–0.2)

## 2023-01-11 LAB — BASIC METABOLIC PANEL
Anion gap: 8 (ref 5–15)
BUN: 11 mg/dL (ref 8–23)
CO2: 25 mmol/L (ref 22–32)
Calcium: 8.5 mg/dL — ABNORMAL LOW (ref 8.9–10.3)
Chloride: 104 mmol/L (ref 98–111)
Creatinine, Ser: 1.02 mg/dL (ref 0.61–1.24)
GFR, Estimated: >60 mL/min (ref >60)
Glucose, Bld: 136 mg/dL — ABNORMAL HIGH (ref 70–99)
Potassium: 3.2 mmol/L — ABNORMAL LOW (ref 3.5–5.1)
Sodium: 137 mmol/L (ref 135–145)

## 2023-01-11 MED ORDER — NALOXEGOL OXALATE 25 MG PO TABS
25.0000 mg | ORAL_TABLET | Freq: Every day | ORAL | Status: DC
  Filled 2023-01-11 (×4): qty 1

## 2023-01-11 MED ORDER — POTASSIUM CHLORIDE CRYS ER 20 MEQ PO TBCR
40.0000 meq | EXTENDED_RELEASE_TABLET | Freq: Once | ORAL | Status: AC
  Administered 2023-01-11: 40 meq via ORAL
  Filled 2023-01-11: qty 2

## 2023-01-11 MED ORDER — POTASSIUM CHLORIDE CRYS ER 20 MEQ PO TBCR
20.0000 meq | EXTENDED_RELEASE_TABLET | Freq: Once | ORAL | Status: AC
  Administered 2023-01-11: 20 meq via ORAL
  Filled 2023-01-11: qty 1

## 2023-01-11 NOTE — Progress Notes (Signed)
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   Patient has been following with his gastroenterologist Dr. Lavon Paganini for management of chronic constipation as well as recent diagnosis of diverticulitis.  He had a CT scan on 10/14 which showed descending and sigmoid colonic diverticulosis with chronic versus acute inflammation in the proximal sigmoid colon.  He was put on a course of Augmentin which he completed with some improvement however symptoms returned.  He was started on a course of Cipro/Flagyl.  All in all, patient failed to, 10-day course of Augmentin and 110-day course of ciprofloxacin and Flagyl as outpatient and despite of that, had persistent symptoms so he was admitted under hospitalist service for management of acute diverticulitis.  Assessment & Plan:   Principal Problem:   Acute diverticulitis Active Problems:   Persistent atrial fibrillation (HCC)   Chronic heart failure with preserved ejection fraction (HFpEF) (HCC)   Mixed hyperlipidemia   Coronary artery disease of native artery of native heart with stable angina pectoris (HCC)   S/P AVR (aortic valve replacement)   IgM monoclonal gammopathy of uncertain significance   History of pacemaker   Essential hypertension   Hypertensive urgency   BPH (benign prostatic hyperplasia)   Cholelithiasis   Abdominal pain, left lower quadrant   Acute on chronic anemia  Acute sigmoid diverticulitis: CT shows acute sigmoid diverticulitis without evidence of free air or abscess.  Patient failed outpatient antibiotics  course x 3. Tolerating soft diet.  No nausea.  GI following.  Pain improving.  GI wants him to continue 7 days of Zosyn and then discharged on oral Augmentin and Flagyl for another week.     Paroxysmal A-fib/flutter Sinus node dysfunction s/p PPM: Stable.  Continue Toprol-XL, digoxin, Eliquis.  Hypokalemia: Low again, will replenish.   Chronic HFpEF: Stable, appears euvolemic on admission.  Continue Toprol-XL.   Coronary artery disease s/p PCI: Stable, denies active chest pain.  Continue Toprol-XL, Pletal, Eliquis, rosuvastatin, Zetia.   MGUS: Follows with heme-onc Dr. Truett Perna, currently under observation.  Stable mild anemia noted.   Osteoarthritis: On chronic prednisone 5 mg daily, continue.   Hypertension: Continue Toprol-XL.  Patient reports history of occasional orthostasis and takes midodrine as needed.   Hyperlipidemia: Continue rosuvastatin and Zetia.   BPH: Continue Cardura.   Cholelithiasis: Noted on CT without evidence of acute cholecystitis.   Chronic pain: Continue home Norco and Fioricet.   Anxiety: Continue home Ativan.   Aortic stenosis s/p bioprosthetic AVR  DVT prophylaxis: Eliquis   Code Status: Full Code  Family Communication: Caretaker present at bedside.  Plan of care discussed with patient in length and he/she verbalized understanding and agreed with it.  Status is: Inpatient Remains inpatient appropriate because: Gradually improving.  Will be discharged when cleared by GI.   Estimated body mass index is 27.92 kg/m as calculated from the following:   Height as of this encounter: 6\' 1"  (1.854 m).   Weight as of this encounter: 96 kg.    Nutritional Assessment: Body mass index is 27.92 kg/m.Marland Kitchen Seen by dietician.  I agree with the assessment  and plan as outlined below: Nutrition Status:        . Skin Assessment: I have examined the patient's skin and I agree with the wound assessment as performed by the wound care RN as outlined  below:    Consultants:  GI  Procedures:  None  Antimicrobials:  Anti-infectives (From admission, onward)    Start     Dose/Rate Route Frequency Ordered Stop   01/08/23 2200  piperacillin-tazobactam (ZOSYN) IVPB 3.375 g        3.375 g 12.5 mL/hr over 240 Minutes Intravenous Every 8 hours 01/08/23 1931     01/08/23 1415  piperacillin-tazobactam (ZOSYN) IVPB 3.375 g        3.375 g 100 mL/hr over 30 Minutes Intravenous  Once 01/08/23 1405 01/08/23 1544         Subjective: See and examined.  Pain is very minimal 2 out of 10.  Tolerating diet without nausea.  No other complaint.  Caretaker at the bedside.  Objective: Vitals:   01/10/23 2113 01/10/23 2150 01/11/23 0500 01/11/23 0508  BP: (!) 189/99 (!) 157/79  (!) 152/80  Pulse:  60  60  Resp:    18  Temp:    97.6 F (36.4 C)  TempSrc:    Oral  SpO2:    96%  Weight:   96 kg   Height:        Intake/Output Summary (Last 24 hours) at 01/11/2023 1327 Last data filed at 01/11/2023 0000 Gross per 24 hour  Intake 524.84 ml  Output --  Net 524.84 ml   Filed Weights   01/08/23 1808 01/10/23 0525 01/11/23 0500  Weight: 92.9 kg 91.7 kg 96 kg    Examination:  General exam: Appears calm and comfortable  Respiratory system: Clear to auscultation. Respiratory effort normal. Cardiovascular system: S1 & S2 heard, RRR. No JVD, murmurs, rubs, gallops or clicks. No pedal edema. Gastrointestinal system: Abdomen is nondistended, soft and very mild periumbilical tenderness. No organomegaly or masses felt. Normal bowel sounds heard. Central nervous system: Alert and oriented. No focal neurological deficits. Extremities: Symmetric 5 x 5 power. Skin: No rashes, lesions or ulcers.  Psychiatry: Judgement and insight appear normal. Mood & affect appropriate.   Data Reviewed: I have personally reviewed following labs and imaging studies  CBC: Recent Labs  Lab 01/08/23 1240 01/09/23 0535 01/10/23 0701 01/11/23 0452  WBC 9.1 6.7 5.9  6.6  NEUTROABS 7.6  --  4.2 4.2  HGB 11.4* 10.4* 10.1* 9.9*  HCT 35.1* 32.0* 31.3* 31.3*  MCV 88.2 91.2 89.7 92.6  PLT 209 190 194 235   Basic Metabolic Panel: Recent Labs  Lab 01/08/23 1240 01/09/23 0535 01/10/23 0701 01/11/23 0455  NA 137 138 138 137  K 3.9 3.6 3.4* 3.2*  CL 100 102 105 104  CO2 29 28 26 25   GLUCOSE 124* 117* 128* 136*  BUN 11 10 10 11   CREATININE 0.91 0.96 0.95 1.02  CALCIUM 9.6 8.8* 8.7* 8.5*   GFR: Estimated Creatinine Clearance: 63.5 mL/min (by C-G formula based on SCr of 1.02 mg/dL). Liver Function Tests: Recent Labs  Lab 01/08/23 1240  AST 17  ALT 15  ALKPHOS 114  BILITOT 0.7  PROT 8.0  ALBUMIN 4.1   Recent Labs  Lab 01/08/23 1240  LIPASE 12   No results for input(s): "AMMONIA" in the last 168 hours. Coagulation Profile: No results for input(s): "INR", "PROTIME" in the last 168 hours. Cardiac Enzymes: No results for input(s): "CKTOTAL", "CKMB", "CKMBINDEX", "TROPONINI"  in the last 168 hours. BNP (last 3 results) No results for input(s): "PROBNP" in the last 8760 hours. HbA1C: No results for input(s): "HGBA1C" in the last 72 hours. CBG: No results for input(s): "GLUCAP" in the last 168 hours. Lipid Profile: No results for input(s): "CHOL", "HDL", "LDLCALC", "TRIG", "CHOLHDL", "LDLDIRECT" in the last 72 hours. Thyroid Function Tests: No results for input(s): "TSH", "T4TOTAL", "FREET4", "T3FREE", "THYROIDAB" in the last 72 hours. Anemia Panel: No results for input(s): "VITAMINB12", "FOLATE", "FERRITIN", "TIBC", "IRON", "RETICCTPCT" in the last 72 hours. Sepsis Labs: No results for input(s): "PROCALCITON", "LATICACIDVEN" in the last 168 hours.  No results found for this or any previous visit (from the past 240 hour(s)).   Radiology Studies: No results found.  Scheduled Meds:  apixaban  5 mg Oral BID   butalbital-acetaminophen-caffeine  1 tablet Oral TID   cholecalciferol  1,000 Units Oral Daily   cilostazol  100 mg Oral BID    digoxin  0.125 mg Oral Daily   doxazosin  4 mg Oral Daily   ezetimibe  10 mg Oral Daily   HYDROcodone-acetaminophen  1 tablet Oral TID   linaclotide  290 mcg Oral QAC breakfast   LORazepam  1 mg Oral QHS   magnesium oxide  400 mg Oral Daily   metoprolol succinate  50 mg Oral BID   naloxegol oxalate  25 mg Oral Daily   pantoprazole  40 mg Oral Daily   polyethylene glycol  17 g Oral BID   potassium chloride SA  20 mEq Oral Daily   potassium chloride  40 mEq Oral Once   predniSONE  5 mg Oral Q0600   rosuvastatin  20 mg Oral QHS   sodium chloride flush  3 mL Intravenous Q12H   Continuous Infusions:  piperacillin-tazobactam (ZOSYN)  IV 3.375 g (01/11/23 0523)     LOS: 3 days   Hughie Closs, MD [facility]  01/11/2023, 1:27 PM   *Please note that this is a verbal dictation therefore any spelling or grammatical errors are due to the "Dragon Medical One" system interpretation.  Please page via Amion and do not message via secure chat for urgent patient care matters. Secure chat can be used for non urgent patient care matters.  How to contact the [facility] Attending or Consulting provider 7A - 7P or covering provider during after hours 7P -7A, for this patient?  Check the care team in [facility] and look for a) attending/consulting TRH provider listed and b) the [facility] team listed. Page or secure chat 7A-7P. Log into [email or web address] and use [place]'s universal password to access. If you do not have the password, please contact the hospital operator. Locate the [facility] provider you are looking for under [facility] and page to a number that you can be directly reached. If you still have difficulty reaching the provider, please page the [facility] (Director on Call) for the Hospitalists listed on amion for assistance.

## 2023-01-11 NOTE — Progress Notes (Signed)
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  Pharmacy has been consulted for Zosyn dosing.  Plan: Continue Zosyn 3.375g IV q8h (4 hour infusion). Pharmacy will sign off notes, but will continue to follow up peripherally for renal function.    Height: 6\' 1"  (185.4 cm) Weight: 96 kg (211 lb 10.3 oz) IBW/kg (Calculated) : 79.9  Temp (24hrs), Avg:97.6 F (36.4 C), Min:97.5 F (36.4 C), Max:97.8 F (36.6 C)  Recent Labs  Lab 01/08/23 1240 01/09/23 0535 01/10/23 0701 01/11/23 0452 01/11/23 0455  WBC 9.1 6.7 5.9 6.6  --   CREATININE 0.91 0.96 0.95  --  1.02    Estimated Creatinine Clearance: 63.5 mL/min (by C-G formula based on SCr of 1.02 mg/dL).    Allergies  Allergen Reactions   Contrast Media [Iodinated Contrast Media] Hives   Gadolinium Derivatives Hives   Iodine-131 Hives   Metrizamide Hives   Tetanus Toxoids     Redness    Antimicrobials this admission:  11/9 Zosyn >>   Dose adjustments this admission:  None  Microbiology results:  None  Thank you for allowing pharmacy to be a part of this patient's care.  Lynann Beaver PharmD, BCPS [facility] main pharmacy [phone] 01/11/2023 10:57 AM

## 2023-01-11 NOTE — Progress Notes (Signed)
Patient ID: Kevin Kass, MD, male   DOB: 1934-08-29, 88 y.o.   MRN: [id number]    Progress Note   Subjective   Day # 4 CC; refractory diverticulitis  IV Zosyn day 4  WBC 6.6/hemoglobin 9.9/hematocrit 31.3 Sodium 137/potassium 3.2 BUN 11/creatinine 1.0  Sitting up in the chair, has been able to eat solid food.  Says his pain is probably 80% better, continued improvement.  He still feels somewhat bloated, and has not had a bowel movement as yet.  We resumed Linzess and MiraLAX yesterday he has been passing gas etc.  His private nurse at bedside relates that he was also taking Movantik at home given his chronic narcotic use.  Will resume Movantik    Objective   Vital signs in last 24 hours: Temp:  [97.5 F (36.4 C)-97.8 F (36.6 C)] 97.6 F (36.4 C) (11/12 0508) Pulse Rate:  [58-60] 60 (11/12 0508) Resp:  [16-19] 18 (11/12 0508) BP: (152-189)/(77-99) 152/80 (11/12 0508) SpO2:  [96 %-98 %] 96 % (11/12 0508) Weight:  [96 kg] 96 kg (11/12 0500) Last BM Date : 01/08/23 General: Very pleasant elderly white male in NAD Heart:  irRegular rate and rhythm; no murmurs Lungs: Respirations even and unlabored, lungs CTA bilaterally Abdomen:  Soft, mild tenderness in the left lower quadrant no guarding or rebound. Normal bowel sounds. Extremities:  Without edema. Neurologic:  Alert and oriented,  grossly normal neurologically. Psych:  Cooperative. Normal mood and affect.  Intake/Output from previous day: 11/11 0701 - 11/12 0700 In: 764.8 [P.O.:480; IV Piggyback:284.8] Out: -  Intake/Output this shift: No intake/output data recorded.  Lab Results: Recent Labs    01/09/23 0535 01/10/23 0701 01/11/23 0452  WBC 6.7 5.9 6.6  HGB 10.4* 10.1* 9.9*  HCT 32.0* 31.3* 31.3*  PLT 190 194 235   BMET Recent Labs    01/09/23 0535 01/10/23 0701 01/11/23 0455  NA 138 138 137  K 3.6 3.4* 3.2*  CL 102 105 104  CO2 28 26 25   GLUCOSE 117* 128* 136*  BUN 10 10 11   CREATININE 0.96 0.95  1.02  CALCIUM 8.8* 8.7* 8.5*   LFT Recent Labs    01/08/23 1240  PROT 8.0  ALBUMIN 4.1  AST 17  ALT 15  ALKPHOS 114  BILITOT 0.7   PT/INR No results for input(s): "LABPROT", "INR" in the last 72 hours.  Studies/Results: No results found.     Assessment / Plan:    #51 88 year old white male with smoldering/refractory diverticulitis symptomatic over the past 4 to 5 months and required multiple courses of oral antibiotics with improvement but never complete resolution of symptoms. Admitted now with more acute severe abdominal pain and CT imaging showing significant upper sigmoid acute diverticulitis, no evidence of abscess or perforation  He has had significant improvement on IV Zosyn, which she states he did not have with the oral antibiotics.  He may have component of stricture given the chronicity of his symptoms, and also consider SCAD though he has never been bothered by diarrhea or bleeding, just the ongoing pain symptoms.  #2 chronic anticoagulation-on Eliquis #3 chronic constipation likely medication induced #4 persistent A-fib/flutter #5.  History of aortic stenosis status post bioprosthetic aortic valve replacement #6 status post pacemaker-sinus node dysfunction #7.  MGUS #8.  Coronary artery disease status post multiple stents #9 chronic anemia #10.  Osteoarthritis on chronic low-dose prednisone #11 BPH  Plan; continue Zosyn, would like him to get a 7-day course prior to discharge then  will need likely at least another week of oral antibiotics.  Consider Augmentin and metronidazole on discharge Would favor repeat imaging outpatient after he completes the full 2 weeks of antibiotics to assess for resolution.  Continue MiraLAX twice daily, Linzess 290 daily and will resume Movantik 25  He will need close outpatient follow-up/Dr. Lavon Paganini  Consider colonoscopy as an outpatient   Principal Problem:   Acute diverticulitis Active Problems:   Mixed  hyperlipidemia   Coronary artery disease of native artery of native heart with stable angina pectoris (HCC)   S/P AVR (aortic valve replacement)   IgM monoclonal gammopathy of uncertain significance   History of pacemaker   Persistent atrial fibrillation (HCC)   Essential hypertension   Hypertensive urgency   Chronic heart failure with preserved ejection fraction (HFpEF) (HCC)   BPH (benign prostatic hyperplasia)   Cholelithiasis   Abdominal pain, left lower quadrant   Acute on chronic anemia     LOS: 3 days   Safia Panzer PA-C 01/11/2023, 12:06 PM

## 2023-01-11 NOTE — Plan of Care (Signed)
  Problem: Nutrition: Goal: Adequate nutrition will be maintained Outcome: Progressing   Problem: Elimination: Goal: Will not experience complications related to bowel motility Outcome: Progressing   Problem: Pain Management: Goal: General experience of comfort will improve Outcome: Progressing   Problem: Safety: Goal: Ability to remain free from injury will improve Outcome: Progressing

## 2023-01-11 NOTE — Plan of Care (Signed)

## 2023-01-12 DIAGNOSIS — I4819 Other persistent atrial fibrillation: Secondary | ICD-10-CM | POA: Diagnosis not present

## 2023-01-12 DIAGNOSIS — I5032 Chronic diastolic (congestive) heart failure: Secondary | ICD-10-CM | POA: Diagnosis not present

## 2023-01-12 DIAGNOSIS — I25118 Atherosclerotic heart disease of native coronary artery with other forms of angina pectoris: Secondary | ICD-10-CM

## 2023-01-12 DIAGNOSIS — R1032 Left lower quadrant pain: Secondary | ICD-10-CM | POA: Diagnosis not present

## 2023-01-12 DIAGNOSIS — E876 Hypokalemia: Secondary | ICD-10-CM | POA: Insufficient documentation

## 2023-01-12 DIAGNOSIS — D649 Anemia, unspecified: Secondary | ICD-10-CM | POA: Diagnosis not present

## 2023-01-12 DIAGNOSIS — K5792 Diverticulitis of intestine, part unspecified, without perforation or abscess without bleeding: Secondary | ICD-10-CM | POA: Diagnosis not present

## 2023-01-12 NOTE — Assessment & Plan Note (Signed)
Resolved with supplementation and starting spironolactone. 

## 2023-01-12 NOTE — Assessment & Plan Note (Signed)
Rate controlled - Continue Eliquis - Continue digoxin, metoprolol

## 2023-01-12 NOTE — Progress Notes (Signed)
    Progress Note   Subjective  Day #5 Chief Complaint: Refractory diverticulitis  IV Zosyn day 5  Sitting up in his bedside chair, has been able to eat, continues to be at least 80 to 85% better with continued improvement.  He is pleased with his progress and thinks he will likely be able to go home on Friday.   Objective   Vital signs in last 24 hours: Temp:  [97.5 F (36.4 C)-97.8 F (36.6 C)] 97.5 F (36.4 C) (11/13 0422) Pulse Rate:  [61-81] 63 (11/13 1000) Resp:  [16-18] 18 (11/13 0422) BP: (115-181)/(58-112) 171/89 (11/13 1000) SpO2:  [96 %-97 %] 96 % (11/13 0422) Weight:  [96 kg] 96 kg (11/13 0500) Last BM Date : 01/11/23 General: Very pleasant elderly white male in NAD Heart:  Regular rate and rhythm; no murmurs Lungs: Respirations even and unlabored, lungs CTA bilaterally Abdomen:  Soft, mild TTP left lower quadrant and nondistended. Normal bowel sounds. Psych:  Cooperative. Normal mood and affect.  Intake/Output from previous day: 11/12 0701 - 11/13 0700 In: 960 [P.O.:960] Out: -   Lab Results: Recent Labs    01/10/23 0701 01/11/23 0452  WBC 5.9 6.6  HGB 10.1* 9.9*  HCT 31.3* 31.3*  PLT 194 235   BMET Recent Labs    01/10/23 0701 01/11/23 0455  NA 138 137  K 3.4* 3.2*  CL 105 104  CO2 26 25  GLUCOSE 128* 136*  BUN 10 11  CREATININE 0.95 1.02  CALCIUM 8.7* 8.5*    Assessment / Plan:   Assessment: 1.  Smoldering/refractory diverticulitis: Symptomatic over the past 4 to 5 months required multiple courses of oral antibiotics with improvement but never complete resolution, admitted now with more acute severe abdominal pain CT showing significant upper sigmoid acute diverticulitis with no evidence of abscess or perforation, significant improvement on IV Zosyn, question component of stricture given chronicity of symptoms and also container scad 2.  Chronic anticoagulation: On Eliquis 3.  Chronic constipation: Likely medication induced 4.  Persistent  A-fib/flutter 5.  History of aortic stenosis status post bioprosthetic aortic valve replacement 6.  Status post pacemaker: Sinus node dysfunction 7.  MGUS 8.  Coronary artery disease status post multiple stents 9.  Chronic anemia 10.  Osteoarthritis: On chronic low-dose prednisone  Plan: 1.  Continue IV Zosyn for a full 7-day course, then can be discharged another week of oral antibiotics, consider Augmentin and Metronidazole. 2.  Favor reimaging outpatient after he completes a full 2 weeks of antibiotics to assess for resolution 3.  Continue MiraLAX twice daily, Linzess 290 daily and Movantik 25 mg daily 4.  Will need close outpatient follow-up with Dr. Lavon Paganini for discussion of colonoscopy  Thank you for your kind consultation, we will continue to follow along.   LOS: 4 days   Unk Lightning  01/12/2023, 11:14 AM

## 2023-01-12 NOTE — Assessment & Plan Note (Signed)
-  Continue prednisone 

## 2023-01-12 NOTE — Progress Notes (Signed)
  Progress Note   Patient: Kevin KNUTESON, MD JXB:[id number] DOB: [birth year] DOA: 01/08/2023     4 DOS: the patient was seen and examined on 01/12/2023 at 9:19AM      Brief hospital course: 88 y.o. M with HTN, pAFlutt on Eliquis, hx PPM, hx CAD last PCI 2021, hx bioprosth AVR, dCHF, chronic orthostasis, osteoarthritis on chronic prednisone, MOH, and MGUS who presented with acute sigmoid diverticulitis failing outpatient management.     Assessment and Plan: * Acute diverticulitis - Continue Zosyn, day 5 of 7 - Consult GI, appreciate expertise - Plan for d/c on Augmentin/Flagyl    Persistent atrial fibrillation (HCC) Rate controlled - Continue Eliquis - Continue digoxin, metoprolol  Chronic heart failure with preserved ejection fraction (HFpEF) (HCC) Appears euvolemic - Continue digoxin, metoprolol  Hypokalemia - Supplement K  Acute on chronic anemia Hgb stable, no overt clinical bleeding  Hypertensive urgency Resolved. BP labile.  Essential hypertension - Continue doxazosin, metoprolol - Hold amlodipine  History of pacemaker    Osteoarthritis of multiple sites - Continue prednisone  IgM monoclonal gammopathy of uncertain significance    S/P AVR (aortic valve replacement)    Coronary artery disease of native artery of native heart with stable angina pectoris (HCC) - Continue Eliquis, Pletal, Zetia, Crestor  Mixed hyperlipidemia - Continue Zetia, Crestor          Subjective: Still some LLQ pain but improving.  No fever, no confusion.  No new complaints.     Physical Exam: BP 127/65 (BP Location: Right Arm)   Pulse 62   Temp (!) 97.5 F (36.4 C) (Oral)   Resp 16   Ht 6\' 1"  (1.854 m)   Wt 96 kg   SpO2 97%   BMI 27.92 kg/m   Adult male, sitting up in bed, eating breakfast Irregularly irregular, no murmurs, no peripheral edema Respiratory rate normal, lungs clear without rales or wheezes Abdomen soft, without tenderness in the left  lower quadrant Attention normal, affect pleasant, judgment and insight appear normal, face symmetric, speech fluent    Data Reviewed: Discussed with gastroenterology Patient metabolic panel shows mild hypokalemia CBC shows mild anemia  Family Communication: None present    Disposition: Status is: Inpatient         Author: Alberteen [name], MD 01/12/2023 1:47 PM  For on call review [email or web address].

## 2023-01-12 NOTE — Assessment & Plan Note (Signed)
-   Continue Eliquis, Pletal, Zetia, Crestor

## 2023-01-12 NOTE — Assessment & Plan Note (Signed)
Resolved. BP labile.

## 2023-01-12 NOTE — Assessment & Plan Note (Signed)
-  Continue Zetia, Crestor ?

## 2023-01-12 NOTE — Plan of Care (Signed)
°  Problem: Clinical Measurements: °Goal: Ability to maintain clinical measurements within normal limits will improve °Outcome: Progressing °  °Problem: Activity: °Goal: Risk for activity intolerance will decrease °Outcome: Progressing °  °Problem: Nutrition: °Goal: Adequate nutrition will be maintained °Outcome: Progressing °  °

## 2023-01-12 NOTE — Assessment & Plan Note (Signed)
-   Continue doxazosin, metoprolol - Hold amlodipine

## 2023-01-12 NOTE — Telephone Encounter (Signed)
He is currently admitted in hospital, he doesn't need CT now

## 2023-01-12 NOTE — Assessment & Plan Note (Signed)
Hgb stable, no overt clinical bleeding

## 2023-01-12 NOTE — Assessment & Plan Note (Signed)
Appears euvolemic - Continue digoxin, metoprolol

## 2023-01-12 NOTE — Assessment & Plan Note (Signed)
-   Continue Zosyn, day 5 of 7 - Consult GI, appreciate expertise - Plan for d/c on Augmentin/Flagyl

## 2023-01-13 DIAGNOSIS — R1032 Left lower quadrant pain: Secondary | ICD-10-CM | POA: Diagnosis not present

## 2023-01-13 DIAGNOSIS — K5792 Diverticulitis of intestine, part unspecified, without perforation or abscess without bleeding: Secondary | ICD-10-CM | POA: Diagnosis not present

## 2023-01-13 DIAGNOSIS — I4819 Other persistent atrial fibrillation: Secondary | ICD-10-CM | POA: Diagnosis not present

## 2023-01-13 DIAGNOSIS — I5032 Chronic diastolic (congestive) heart failure: Secondary | ICD-10-CM | POA: Diagnosis not present

## 2023-01-13 LAB — CBC
HCT: 31.5 % — ABNORMAL LOW (ref 39.0–52.0)
Hemoglobin: 10.3 g/dL — ABNORMAL LOW (ref 13.0–17.0)
MCH: 29.3 pg (ref 26.0–34.0)
MCHC: 32.7 g/dL (ref 30.0–36.0)
MCV: 89.5 fL (ref 80.0–100.0)
Platelets: 206 10*3/uL (ref 150–400)
RBC: 3.52 MIL/uL — ABNORMAL LOW (ref 4.22–5.81)
RDW: 15.2 % (ref 11.5–15.5)
WBC: 6.1 10*3/uL (ref 4.0–10.5)
nRBC: 0 % (ref 0.0–0.2)

## 2023-01-13 LAB — C DIFFICILE (CDIFF) QUICK SCRN (NO PCR REFLEX)
C Diff antigen: NEGATIVE
C Diff interpretation: NOT DETECTED
C Diff toxin: NEGATIVE

## 2023-01-13 LAB — COMPREHENSIVE METABOLIC PANEL
ALT: 16 U/L (ref 0–44)
AST: 21 U/L (ref 15–41)
Albumin: 3.3 g/dL — ABNORMAL LOW (ref 3.5–5.0)
Alkaline Phosphatase: 120 U/L (ref 38–126)
Anion gap: 8 (ref 5–15)
BUN: 11 mg/dL (ref 8–23)
CO2: 24 mmol/L (ref 22–32)
Calcium: 9 mg/dL (ref 8.9–10.3)
Chloride: 105 mmol/L (ref 98–111)
Creatinine, Ser: 0.9 mg/dL (ref 0.61–1.24)
GFR, Estimated: >60 mL/min (ref >60)
Glucose, Bld: 121 mg/dL — ABNORMAL HIGH (ref 70–99)
Potassium: 3.7 mmol/L (ref 3.5–5.1)
Sodium: 137 mmol/L (ref 135–145)
Total Bilirubin: 0.6 mg/dL (ref <1.2)
Total Protein: 6.9 g/dL (ref 6.5–8.1)

## 2023-01-13 LAB — C-REACTIVE PROTEIN: CRP: 1.7 mg/dL — ABNORMAL HIGH (ref <1.0)

## 2023-01-13 LAB — SEDIMENTATION RATE: Sed Rate: 77 mm/h — ABNORMAL HIGH (ref 0–16)

## 2023-01-13 MED ORDER — ALUM & MAG HYDROXIDE-SIMETH 200-200-20 MG/5ML PO SUSP
30.0000 mL | ORAL | Status: DC | PRN
  Administered 2023-01-13: 30 mL via ORAL
  Filled 2023-01-13: qty 30

## 2023-01-13 MED ORDER — POTASSIUM CHLORIDE CRYS ER 20 MEQ PO TBCR
40.0000 meq | EXTENDED_RELEASE_TABLET | Freq: Every day | ORAL | Status: DC
  Administered 2023-01-14: 40 meq via ORAL
  Filled 2023-01-13: qty 2

## 2023-01-13 MED ORDER — METOPROLOL SUCCINATE ER 50 MG PO TB24
75.0000 mg | ORAL_TABLET | Freq: Two times a day (BID) | ORAL | Status: DC
Start: 2023-01-13 — End: 2023-01-14
  Administered 2023-01-13 – 2023-01-14 (×2): 75 mg via ORAL
  Filled 2023-01-13 (×2): qty 2

## 2023-01-13 NOTE — Progress Notes (Signed)
    Progress Note   Subjective  Day #6 Chief Complaint: Refractory diverticulitis  IV Zosyn day 6  Sitting up in chair, has continued to eat a soft diet.  Remains at least 85 to 90% better.  Continues a small amount of left lower quadrant discomfort.  Tells me he has continued to have liquid bowel movements but these are not copious.  He has had 1 loose stool this morning.  He is ready to go home tomorrow   Objective   Vital signs in last 24 hours: Temp:  [97.6 F (36.4 C)-97.9 F (36.6 C)] 97.6 F (36.4 C) (11/14 0456) Pulse Rate:  [59-63] 60 (11/14 0608) Resp:  [16-19] 16 (11/14 0608) BP: (121-195)/(56-102) 121/56 (11/14 0843) SpO2:  [97 %-100 %] 97 % (11/14 0608) Weight:  [97.7 kg] 97.7 kg (11/14 0500) Last BM Date : 01/12/23 General:    white male in NAD Heart:  Regular rate and rhythm; no murmurs Lungs: Respirations even and unlabored, lungs CTA bilaterally Abdomen:  Soft, mild left lower quadrant TTP and nondistended. Normal bowel sounds. Psych:  Cooperative. Normal mood and affect.  Intake/Output from previous day: 11/13 0701 - 11/14 0700 In: 1311 [P.O.:1311] Out: -    Lab Results: Recent Labs    01/11/23 0452 01/13/23 0510  WBC 6.6 6.1  HGB 9.9* 10.3*  HCT 31.3* 31.5*  PLT 235 206   BMET Recent Labs    01/11/23 0455 01/13/23 0510  NA 137 137  K 3.2* 3.7  CL 104 105  CO2 25 24  GLUCOSE 136* 121*  BUN 11 11  CREATININE 1.02 0.90  CALCIUM 8.5* 9.0   LFT Recent Labs    01/13/23 0510  PROT 6.9  ALBUMIN 3.3*  AST 21  ALT 16  ALKPHOS 120  BILITOT 0.6    Assessment / Plan:   Assessment: 1.  Smoldering/refractory diverticulitis: Symptomatic over the past 4 to 5 months, required multiple courses of oral antibiotics with improvement but never complete resolution, admitted now with more acute severe abdominal pain, CT showing significant upper sigmoid acute diverticulitis with no evidence of abscess or perforation, improving slowly on IV Zosyn;  question component of stricture given chronicity of symptoms and also consider SCAD 2.  Chronic anticoagulation: On Eliquis  3.  Chronic constipation: Likely medication induced 4.  Persistent A-fib/flutter 5.  History of aortic stenosis status post bioprosthetic aortic valve replacement 6.  Status post pacemaker: Sinus node dysfunction 7.  MGUS 8.  Coronary artery disease status post multiple stents 9.  Chronic anemia 10.  Osteoarthritis: On chronic low-dose Prednisone  Plan: 1.  Continue IV Zosyn for a full 7-day course, then be discharged home on another week of oral antibiotics, Augmentin and Metronidazole.  This regimen has been specifically communicated with the Depri[facility] and they are in agreement with this and want this. 2.  Favor reimaging outpatient after he completes a full 2 weeks of antibiotics to assess for resolution 3.  Continue MiraLAX twice daily, Linzess 290 mcg daily 4.  Is experiencing some diarrhea, this is likely due to laxative use as it does not seem copious but we will order a C. difficile PCR today to rule this out 5.  Patient will follow-up with Dr. Lavon Paganini after discharge for discussion of colonoscopy  Thank you for your kind consultation, we will continue to follow along.   LOS: 5 days   Unk Lightning  01/13/2023, 9:59 AM

## 2023-01-13 NOTE — Plan of Care (Signed)
  Problem: Education: Goal: Knowledge of General Education information will improve Description: Including pain rating scale, medication(s)/side effects and non-pharmacologic comfort measures Outcome: Progressing   Problem: Clinical Measurements: Goal: Ability to maintain clinical measurements within normal limits will improve Outcome: Progressing Goal: Diagnostic test results will improve Outcome: Progressing   

## 2023-01-13 NOTE — Progress Notes (Signed)
  Progress Note   Patient: Kevin DOEGE, MD ZOX:[id number] DOB: 12/17/1934 DOA: 01/08/2023     5 DOS: the patient was seen and examined on 01/13/2023 at 10:20AM and 4:10PM      Brief hospital course: 88 y.o. M with HTN, pAFlutt on Eliquis, hx PPM, hx CAD last PCI 2021, hx bioprosth AVR, dCHF, chronic orthostasis, osteoarthritis on chronic prednisone, MOH, and MGUS who presented with acute sigmoid diverticulitis failing outpatient management.     Assessment and Plan: * Acute diverticulitis - Continue Zosyn, day 6 of 7 - Follow Cdiff panel   Persistent atrial fibrillation (HCC) Rate good, BP labile - Continue metoprolol, titrate up dose - Continue Eliquis, dig   Chronic heart failure with preserved ejection fraction (HFpEF) (HCC) Appears euvolemic  Essential hypertension BP labile.  A priori would assume doxazosin would be avoided in one with autonomic dysreflexia, but I trust the astute and experienced clinicians who are managing this. - Continue doxazosin, metoprolol  Osteoarthritis of multiple sites - Continue prednisone  Coronary artery disease of native artery of native heart with stable angina pectoris (HCC) Mixed hyperlipidemia - Continue Eliquis, Pletal, Zetia, Crestor          Subjective: Feeling well.  No new symptoms, pain is improving.  No fever.  Stools are frequent, and there is scant blood with wiping.     Physical Exam: BP (!) 163/104 (BP Location: Right Arm)   Pulse 66   Temp 97.7 F (36.5 C) (Oral)   Resp 16   Ht 6\' 1"  (1.854 m)   Wt 97.7 kg   SpO2 99%   BMI 28.42 kg/m   Adult male, lying in bed, interactive and appropriate Irregularly irregular, no murmurs, no peripheral edema Respiratory rate normal, lungs clear without rales or wheezes Abdomen soft, minimal tenderness in the left lower quadrant, no guarding, no pleasant, judgment and insight appear good,, speech fluent   Data Reviewed: Comprehensive metabolic panel  unremarkable, potassium improved, creatinine great CRP down to 1.7 CBC unremarkable, mild anemia, stable  Family Communication: None present    Disposition: Status is: Inpatient         Author: Alberteen [name], MD 01/13/2023 4:34 PM  For on call review [email or web address].

## 2023-01-13 NOTE — Progress Notes (Signed)
Mobility Specialist - Progress Note   01/13/23 1333  Therapy Vitals  Temp 97.7 F (36.5 C)  Temp Source Oral  Pulse Rate 66  Resp 16  BP (!) 163/104  Patient Position (if appropriate) Lying  Orthostatic Lying   BP- Lying (!) 163/104  Orthostatic Sitting  BP- Sitting 150/75  Orthostatic Standing at 0 minutes  BP- Standing at 0 minutes 129/69  Orthostatic Standing at 3 minutes  BP- Standing at 3 minutes 117/62  Oxygen Therapy  SpO2 99 %  O2 Device Room Air  Mobility  Activity Stood at bedside  Level of Public librarian None  Activity Response Tolerated well  Mobility Referral Yes  $Mobility charge 1 Mobility  Mobility Specialist Start Time (ACUTE ONLY) 0127  Mobility Specialist Stop Time (ACUTE ONLY) 0147  Mobility Specialist Time Calculation (min) (ACUTE ONLY) 20 min   Pt received in bed declining mobility but agreeable to take orthostatic vitals per nurse request. See above for BP readings. No complaints during session. Pt to bed after session with all needs met.   [facility]

## 2023-01-13 NOTE — TOC Initial Note (Addendum)
Transition of Care [facility]) - Initial/Assessment Note    Patient Details  Name: Kevin BAYS, MD MRN: [id number] Date of Birth: May 15, 1934  Transition of [facility]) CM/SW Contact:    Lanier Clam, RN Phone Number: 01/13/2023, 3:20 PM  Clinical Narrative: Left vm w/spouse to discuss d/c plans. Await call back.  -3:32p received call back from Gail(spouse) patient from [facility]-uses a cane-recc PT eval-await recc.                 Expected Discharge Plan: Home/Self Care Barriers to Discharge: Continued Medical Work up   Patient Goals and CMS Choice Patient states their goals for this hospitalization and ongoing recovery are:: Case manager CMS Medicare.gov Compare Post Acute Care list provided to:: Patient Represenative (must comment) (Gail(spouse))   [place] ownership interest in [employer]provided to:: Spouse    Expected Discharge Plan and Services   Discharge Planning Services: CM Consult   Living arrangements for the past 2 months: Single Family Home                                      Prior Living Arrangements/Services Living arrangements for the past 2 months: Single Family Home Lives with:: Spouse Patient language and need for interpreter reviewed:: Yes Do you feel safe going back to the place where you live?: Yes      Need for Family Participation in Patient Care: Yes (Comment) Care giver support system in place?: Yes (comment)   Criminal Activity/Legal Involvement Pertinent to Current Situation/Hospitalization: No - Comment as needed  Activities of Daily Living   ADL Screening (condition at time of admission) Independently performs ADLs?: Yes (appropriate for developmental age) Is the patient deaf or have difficulty hearing?: No Does the patient have difficulty seeing, even when wearing glasses/contacts?: No Does the patient have difficulty concentrating, remembering, or making decisions?: No  Permission  Sought/Granted Permission sought to share information with : Case Manager Permission granted to share information with : Yes, Verbal Permission Granted  Share Information with NAME: Case manager           Emotional Assessment Appearance:: Appears stated age Attitude/Demeanor/Rapport: Gracious Affect (typically observed): Accepting Orientation: : Oriented to Self, Oriented to Place, Oriented to  Time, Oriented to Situation Alcohol / Substance Use: Not Applicable Psych Involvement: No (comment)  Admission diagnosis:  Diverticulitis [K57.92] Elevated blood pressure reading [R03.0] Acute diverticulitis [K57.92] Patient Active Problem List   Diagnosis Date Noted   Hypokalemia 01/12/2023   Abdominal pain, left lower quadrant 01/12/2023   Acute on chronic anemia 01/11/2023   Acute diverticulitis 01/08/2023   Hypertensive urgency 01/08/2023   Chronic heart failure with preserved ejection fraction (HFpEF) (HCC) 01/08/2023   BPH (benign prostatic hyperplasia) 01/08/2023   Cholelithiasis 01/08/2023   History of melanoma 10/12/2021   Melanocytic nevi of trunk 10/12/2021   Seborrheic dermatitis 10/12/2021   Seborrheic keratoses 10/12/2021   Central retinal artery occlusion 04/13/2021   Retinal artery branch occlusion of right eye 04/12/2021   Essential hypertension 04/12/2021   Mass of orbit 04/12/2021   Pure hypercholesterolemia 09/08/2020   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HCC) 02/27/2020   Chronic systolic heart failure (HCC) 02/27/2020   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   Urinary retention with incomplete bladder emptying 12/05/2019   Physical deconditioning 12/05/2019   Other hemorrhoids 12/05/2019   Adjustment reaction with anxiety 12/05/2019   A-fib (HCC)  10/14/2019   Atrial fibrillation with rapid ventricular response (HCC) 10/13/2019   NSTEMI (non-ST elevated myocardial infarction) (HCC) 10/13/2019   Acute diarrhea 10/13/2019   Normocytic  anemia 10/13/2019   Diverticulitis sigmoid colon recurrent    Prediabetes 09/21/2017   Persistent atrial fibrillation (HCC) 04/11/2017   Vitamin D deficiency 05/20/2016   History of pacemaker 05/20/2016   History of CVA (cerebrovascular accident) 05/20/2016   Primary osteoarthritis of both hands 03/10/2016   DDD cervical spine 03/10/2016   Osteoarthritis of multiple sites 03/10/2016   Chronic left shoulder pain 03/10/2016   Trochanteric bursitis of right hip 03/10/2016   Other fatigue 03/10/2016   Claudication in peripheral vascular disease (HCC) 03/10/2016   Chronic pain syndrome 03/10/2016   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   Typical atrial flutter (HCC)    PVC's (premature ventricular contractions) 01/30/2015   Pacemaker 04/30/2013   IgM monoclonal gammopathy of uncertain significance 02/17/2013   Orthostatic hypotension 04/11/2011   Long term current use of anticoagulant therapy 03/24/2011   S/P AVR (aortic valve replacement) 03/24/2011   Pleural effusion 03/24/2011   Hypothyroidism 11/22/2010   Atrial flutter (HCC) 08/19/2010   Syndrome X (cardiac) (HCC) 04/06/2010   Mixed hyperlipidemia 12/08/2007   PRIMARY HYPERCOAGULABLE STATE 12/08/2007   Coronary artery disease with exertional angina (HCC) 12/08/2007   Coronary artery disease of native artery of native heart with stable angina pectoris (HCC) 12/08/2007   AORTIC STENOSIS/ INSUFFICIENCY, NON-RHEUMATIC 12/08/2007   Atrial fibrillation (HCC) 12/08/2007   Chronotropic incompetence with sinus node dysfunction 12/08/2007   PCP:  Mahlon Gammon, MD Pharmacy:   [facility] [address] [place] [address] Phone: [phone] Fax: [phone]     Social Determinants of Health (SDOH) Social History: SDOH Screenings   Food Insecurity: No Food Insecurity (01/08/2023)  Housing: Low Risk  (01/08/2023)  Transportation Needs: No Transportation Needs (01/08/2023)   Utilities: Not At Risk (01/08/2023)  Depression (PHQ2-9): Low Risk  (05/18/2022)  Tobacco Use: Low Risk  (01/08/2023)   SDOH Interventions:     Readmission Risk Interventions     No data to display

## 2023-01-14 ENCOUNTER — Other Ambulatory Visit (HOSPITAL_COMMUNITY): Payer: Self-pay

## 2023-01-14 DIAGNOSIS — K5792 Diverticulitis of intestine, part unspecified, without perforation or abscess without bleeding: Secondary | ICD-10-CM | POA: Diagnosis not present

## 2023-01-14 LAB — BASIC METABOLIC PANEL
Anion gap: 9 (ref 5–15)
BUN: 11 mg/dL (ref 8–23)
CO2: 23 mmol/L (ref 22–32)
Calcium: 8.9 mg/dL (ref 8.9–10.3)
Chloride: 103 mmol/L (ref 98–111)
Creatinine, Ser: 0.86 mg/dL (ref 0.61–1.24)
GFR, Estimated: >60 mL/min (ref >60)
Glucose, Bld: 108 mg/dL — ABNORMAL HIGH (ref 70–99)
Potassium: 3.5 mmol/L (ref 3.5–5.1)
Sodium: 135 mmol/L (ref 135–145)

## 2023-01-14 LAB — CBC
HCT: 32 % — ABNORMAL LOW (ref 39.0–52.0)
Hemoglobin: 10.4 g/dL — ABNORMAL LOW (ref 13.0–17.0)
MCH: 29.1 pg (ref 26.0–34.0)
MCHC: 32.5 g/dL (ref 30.0–36.0)
MCV: 89.6 fL (ref 80.0–100.0)
Platelets: 223 10*3/uL (ref 150–400)
RBC: 3.57 MIL/uL — ABNORMAL LOW (ref 4.22–5.81)
RDW: 15.1 % (ref 11.5–15.5)
WBC: 6.4 10*3/uL (ref 4.0–10.5)
nRBC: 0 % (ref 0.0–0.2)

## 2023-01-14 MED ORDER — AMOXICILLIN-POT CLAVULANATE 875-125 MG PO TABS
1.0000 | ORAL_TABLET | Freq: Two times a day (BID) | ORAL | 0 refills | Status: DC
  Filled 2023-01-14: qty 14, 7d supply, fill #0

## 2023-01-14 MED ORDER — MIDODRINE HCL 2.5 MG PO TABS: 2.5000 mg | ORAL_TABLET | ORAL | Status: DC | PRN

## 2023-01-14 MED ORDER — METRONIDAZOLE 500 MG PO TABS
500.0000 mg | ORAL_TABLET | Freq: Three times a day (TID) | ORAL | 0 refills | Status: DC
  Filled 2023-01-14: qty 21, 7d supply, fill #0

## 2023-01-14 NOTE — Progress Notes (Addendum)
    Progress Note   Subjective  Day #7 Chief Complaint: Refractory diverticulitis  IV Zosyn day 7  Sitting up in the bed, tells me he had his last liquidy bowel movement around 6 PM yesterday and has had nothing since.  Tells me he is much improved with only minimal maybe a 1/10 discomfort in the left lower quadrant which is way better than it has been over the past few months.  He is eager to go home.   Objective   Vital signs in last 24 hours: Temp:  [97.7 F (36.5 C)-98.1 F (36.7 C)] 98.1 F (36.7 C) (11/15 0421) Pulse Rate:  [60-66] 60 (11/15 0518) Resp:  [16-18] 18 (11/15 0421) BP: (138-180)/(69-104) 138/69 (11/15 0518) SpO2:  [96 %-99 %] 96 % (11/15 0421) Weight:  [97.9 kg] 97.9 kg (11/15 0616) Last BM Date : 01/13/23 General:    white male in NAD Heart:  Regular rate and rhythm; no murmurs Lungs: Respirations even and unlabored, lungs CTA bilaterally Abdomen:  Soft, mild left lower quadrant TTP and nondistended. Normal bowel sounds. Psych:  Cooperative. Normal mood and affect.  Intake/Output from previous day: 11/14 0701 - 11/15 0700 In: 1693.6 [P.O.:1200; IV Piggyback:493.6] Out: -   Lab Results: Recent Labs    01/13/23 0510 01/14/23 0448  WBC 6.1 6.4  HGB 10.3* 10.4*  HCT 31.5* 32.0*  PLT 206 223   BMET Recent Labs    01/13/23 0510 01/14/23 0448  NA 137 135  K 3.7 3.5  CL 105 103  CO2 24 23  GLUCOSE 121* 108*  BUN 11 11  CREATININE 0.90 0.86  CALCIUM 9.0 8.9   LFT Recent Labs    01/13/23 0510  PROT 6.9  ALBUMIN 3.3*  AST 21  ALT 16  ALKPHOS 120  BILITOT 0.6    Assessment / Plan:   Assessment: 1.  Smoldering/refractory diverticulitis: Symptomatic over the past 4 to 5 months, required multiple courses of oral antibiotics with improvement but never complete resolution, admitted now status post 7 days of IV antibiotics, diminished discomfort now rated as a 1/10 with plans to send him home on oral antibiotics and follow-up with colonoscopy  in the near future 2.  Chronic anticoagulation: On Eliquis 3.  Chronic constipation 4.  Persistent A-fib/flutter 5.  History of aortic stenosis status post bioprosthetic aortic valve replacement 6.  Status post pacemaker: Sinus node dysfunction 7.  MGUS 8.  CAD status post multiple stents 9.  Chronic anemia 10.  Osteoarthritis: On chronic low-dose Prednisone  Plan: 1.  Send patient home with another 7 days of Augmentin and Metronidazole. 2.  Patient has follow-up with Dr. Lavon Paganini on next Tuesday, 01/18/2023.  She can arrange for repeat CT imaging as necessary. 3.  C. difficile testing negative yesterday.  Loose stools thought from laxative use. 4.  Continue laxative regimen  We will sign off.  Thank you for your kind consultation.   LOS: 6 days   Unk Lightning  01/14/2023, 10:30 AM

## 2023-01-14 NOTE — Plan of Care (Signed)

## 2023-01-14 NOTE — Plan of Care (Signed)
  Problem: Clinical Measurements: Goal: Cardiovascular complication will be avoided Outcome: Progressing   Problem: Coping: Goal: Level of anxiety will decrease Outcome: Progressing   Problem: Pain Management: Goal: General experience of comfort will improve Outcome: Progressing

## 2023-01-14 NOTE — Discharge Summary (Signed)
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 Lavon Paganini in the office Dr. Lavon Paganini: Please obtain CT abdomen in follow up at appropriate interval     Discharge Diagnoses: Principal Problem:   Acute diverticulitis Active Problems:   Persistent atrial fibrillation (HCC)   Chronic heart failure with preserved ejection fraction (HFpEF) (HCC)   Mixed hyperlipidemia   Coronary artery disease of native artery of native heart with stable angina pectoris (HCC)   S/P AVR (aortic valve replacement)   IgM monoclonal gammopathy of uncertain significance   Osteoarthritis of multiple sites   History of pacemaker   Essential hypertension   Hypertensive urgency   Acute on chronic anemia   Hypokalemia        Hospital Course: 88 y.o. M with HTN, p atrial flutter on Eliquis, hx PPM, hx CAD last PCI 2021, hx bioprosth AVR, dCHF, chronic orthostasis, osteoarthritis on chronic prednisone, MOH, and MGUS who presented with acute sigmoid diverticulitis failing outpatient management.      * Acute diverticulitis Patient was admitted and GI was consulted.  He was started on Zosyn, and his abdominal pain improved.  In shared decision-making with the patient and GI, it was decided to complete 7 days of IV Zosyn, followed by 7 days of Augmentin Flagyl.  He will follow-up with Dr. Lavon Paganini in the office, defer outpatient repeat imaging to Dr. Lavon Paganini.            The [facility] Controlled Substances Registry was reviewed for this patient prior to discharge.   Consultants: Gastroenterology    Disposition: Home   DISCHARGE MEDICATION: Allergies as of 01/14/2023       Reactions   Contrast Media  [iodinated Contrast Media] Hives   Gadolinium Derivatives Hives   Iodine-131 Hives   Metrizamide Hives   Tetanus Toxoids    Redness         Medication List     STOP taking these medications    ciprofloxacin 500 MG tablet Commonly known as: CIPRO       TAKE these medications    amLODipine 2.5 MG tablet Commonly known as: NORVASC Take 0.5 tablets (1.25 mg total) by mouth daily. What changed:  when to take this reasons to take this   amoxicillin-clavulanate 875-125 MG tablet Commonly known as: AUGMENTIN Take 1 tablet by mouth 2 (two) times daily.   butalbital-acetaminophen-caffeine 50-325-40 MG tablet Commonly known as: Bac Take 1 tablet by mouth 3 (three) times daily.   butalbital-acetaminophen-caffeine 50-325-40 MG tablet Commonly known as: Bac Take 1 tablet by mouth three times a day as needed pt aware of acetaminophen in Norco as well   cholecalciferol 25 MCG (1000 UNIT) tablet Commonly known as: VITAMIN D3 Take 1,000 Units by mouth daily.   cilostazol 100 MG tablet Commonly known as: PLETAL Take 1 tablet (100 mg total) by mouth 2 (two) times daily.   Coenzyme Q10 200 MG Tabs Take 200 mg by mouth daily.   desoximetasone 0.25 % cream Commonly known as: TOPICORT Apply 1 Application topically 2 (two) times daily. What changed:  when to take this reasons to take this   diclofenac Sodium 1 % Gel Commonly known as: VOLTAREN Apply 2 g topically  2 (two) times daily as needed (knee pain).   digoxin 0.125 MG tablet Commonly known as: LANOXIN Take 1 tablet (0.125 mg total) by mouth daily.   diphenhydrAMINE 25 MG tablet Commonly known as: BENADRYL Take 2 tablets (50 mg total) by mouth one hour prior to procedure What changed:  when to take this reasons to take this   diphenhydrAMINE 50 MG tablet Commonly known as: BENADRYL Take 1 tablet (50 mg total) by mouth at bedtime as needed for itching. Use with CT scan. Instructions given. What changed:  Another medication with the same name was changed. Make sure you understand how and when to take each.   doxazosin 4 MG tablet Commonly known as: CARDURA Take 1 tablet (4 mg total) by mouth daily. What changed: when to take this   Eliquis 5 MG Tabs tablet Generic drug: apixaban Take 1 tablet (5 mg total) by mouth 2 (two) times daily.   ezetimibe 10 MG tablet Commonly known as: ZETIA Take 1 tablet (10 mg total) by mouth daily. What changed: when to take this   furosemide 20 MG tablet Commonly known as: LASIX Take 20 mg by mouth daily as needed for fluid.   HYDROcodone-acetaminophen 10-325 MG tablet Commonly known as: NORCO Take 1 tablet by mouth three times a day as directed DX G89.4   Linzess 290 MCG Caps capsule Generic drug: linaclotide Take 1 capsule (290 mcg total) by mouth daily before breakfast.   LORazepam 1 MG tablet Commonly known as: ATIVAN Take 1 tablet (1 mg) by mouth at bedtime, and 0.5 tablet (0.5 mg) daily as needed for anxiety.   magnesium oxide 400 MG tablet Commonly known as: MAG-OX Take 1 tablet (400 mg total) by mouth daily.   metoprolol succinate 25 MG 24 hr tablet Commonly known as: Toprol XL Take 3 tablets (75 mg total) by mouth 2 (two) times daily. What changed: how much to take   metroNIDAZOLE 500 MG tablet Commonly known as: FLAGYL Take 1 tablet (500 mg total) by mouth 3 (three) times daily.   midodrine 2.5 MG tablet Commonly known as: PROAMATINE Take 1 tablet (2.5 mg total) by mouth as needed.   Movantik 25 MG Tabs tablet Generic drug: naloxegol oxalate Take 1 tablet (25 mg total) by mouth daily. Take 12.5 for 2 weeks if tolerated can increase to 25 mg daily. What changed:  when to take this additional instructions   multivitamin with minerals tablet Take 1 tablet by mouth daily.   nitroGLYCERIN 0.4 MG SL tablet Commonly known as: NITROSTAT Place 1 tablet (0.4 mg total) under the tongue every 5 (five) minutes as needed for chest  pain.   pantoprazole 40 MG tablet Commonly known as: PROTONIX Take 1 tablet (40 mg total) by mouth 2 (two) times daily. What changed: when to take this   polyethylene glycol 17 g packet Commonly known as: MIRALAX / GLYCOLAX Take 17 g by mouth daily.   potassium chloride SA 20 MEQ tablet Commonly known as: KLOR-CON M Take 1 tablet (20 mEq total) by mouth daily.   predniSONE 5 MG tablet Commonly known as: DELTASONE Take 5 mg by mouth daily with breakfast.   rosuvastatin 20 MG tablet Commonly known as: CRESTOR Take 1 tablet (20 mg total) by mouth at bedtime. Please keep scheduled appointment for future refills.   sennosides-docusate sodium 8.6-50 MG tablet Commonly known as: SENOKOT-S Take 1 tablet by mouth 2 (two) times daily.   SYSTANE OP Place 1 drop into both eyes daily  as needed (dry eyes).   ZEGERID PO Take 1 capsule by mouth daily as needed (acid reflux).        Follow-up Information     Mahlon Gammon, MD Follow up.   Specialty: Internal Medicine Contact information: [address] [place] [address] [phone]         Napoleon Form, MD Follow up.   Specialty: Gastroenterology Contact information: [address] [place] [address] [phone]                 Discharge Instructions     Discharge instructions   Complete by: As directed    Take Augmentin 875-125 mg twice daily and metronidazole 500 mg TID for 1 week  Follow up with Dr. Lavon Paganini in 1 week for repeat imaging and next steps  Take potassium 20-40 mEq nightly for the next week (I defer to you if you'd like to bump the dose up for a week)  Have your sed rate and metabolic panel checked in 1 week  Resume all your other home medicines and PRNs without change   Increase activity slowly   Complete by: As directed        Discharge Exam: Filed Weights   01/12/23 0500 01/13/23 0500 01/14/23 0616  Weight: 96 kg 97.7 kg 97.9 kg    General: Pt is alert,  awake, not in acute distress Cardiovascular: RRR, nl S1-S2, no murmurs appreciated.   No LE edema.   Respiratory: Normal respiratory rate and rhythm.  CTAB without rales or wheezes. Abdominal: Abdomen soft and non-tender.  No distension or HSM.   Neuro/Psych: Strength symmetric in upper and lower extremities.  Judgment and insight appear normal.   Condition at discharge: good  The results of significant diagnostics from this hospitalization (including imaging, microbiology, ancillary and laboratory) are listed below for reference.   Imaging Studies: CT ABDOMEN PELVIS WO CONTRAST  Result Date: 01/08/2023 CLINICAL DATA:  Left lower quadrant abdominal pain. EXAM: CT ABDOMEN AND PELVIS WITHOUT CONTRAST TECHNIQUE: Multidetector CT imaging of the abdomen and pelvis was performed following the standard protocol without IV contrast. RADIATION DOSE REDUCTION: This exam was performed according to the departmental dose-optimization program which includes automated exposure control, adjustment of the mA and/or kV according to patient size and/or use of iterative reconstruction technique. COMPARISON:  12/13/2022 FINDINGS: Lower chest: The lung bases are clear of acute process. No pleural effusion or pulmonary lesions. The heart is normal in size. No pericardial effusion. The distal esophagus and aorta are unremarkable. Hepatobiliary: No hepatic lesions or intrahepatic biliary dilatation. Innumerable small layering gallstones in the gallbladder. No findings for acute cholecystitis. Pancreas: No mass, inflammation or ductal dilatation. Spleen: Normal size.  No focal lesions. Adrenals/Urinary Tract: The adrenal glands are normal. Stable simple right renal cyst not requiring any further imaging evaluation or follow-up. No renal or obstructing ureteral calculi. Mild irregular bladder wall thickening appears stable. Suspect small cellules and trabeculation. No bladder mass or calculi. Moderate bladder distension.  Estimated volume is 600 cc. Stomach/Bowel: The stomach, duodenum and small are unremarkable. Focus of acute diverticulitis involving the upper sigmoid colon with marked wall thickening and pericolonic inflammatory changes. No free air or abscess. Stable underlying diverticulosis. Vascular/Lymphatic: Stable age related atherosclerotic calcification involving the aorta and branch vessels but no aneurysm. No abdominal or pelvic lymphadenopathy. No inguinal adenopathy. Reproductive: Enlarged prostate gland with median lobe hypertrophy impressing on the base of the bladder. The seminal vesicles are unremarkable. Other: Small amount  of free pelvic fluid likely related to the diverticulitis. Musculoskeletal: No significant bony findings. IMPRESSION: 1. Focus of acute diverticulitis involving the upper sigmoid colon with marked wall thickening and pericolonic inflammatory changes. No free air or abscess. 2. Cholelithiasis but no findings for acute cholecystitis. 3. Enlarged prostate gland with median lobe hypertrophy impressing on the base of the bladder. 4. Stable mild irregular bladder wall thickening. Suspect small cellules and trabeculation. No bladder mass or calculi. Moderate bladder distension. 5. Aortic atherosclerosis. Aortic Atherosclerosis (ICD10-I70.0). Electronically Signed   By: Rudie Meyer M.D.   On: 01/08/2023 13:05    Microbiology: Results for orders placed or performed during the hospital encounter of 01/08/23  C Difficile Quick Screen (NO PCR Reflex)     Status: None   Collection Time: 01/13/23  3:44 PM   Specimen: STOOL  Result Value Ref Range Status   C Diff antigen NEGATIVE NEGATIVE Final   C Diff toxin NEGATIVE NEGATIVE Final   C Diff interpretation No C. difficile detected.  Final    Comment: Performed at [facility], [address], [place], [place] [address]   *Note: Due to a large number of results and/or encounters for the requested time period, some results  have not been displayed. A complete set of results can be found in Results Review.    Labs: CBC: Recent Labs  Lab 01/08/23 1240 01/09/23 0535 01/10/23 0701 01/11/23 0452 01/13/23 0510 01/14/23 0448  WBC 9.1 6.7 5.9 6.6 6.1 6.4  NEUTROABS 7.6  --  4.2 4.2  --   --   HGB 11.4* 10.4* 10.1* 9.9* 10.3* 10.4*  HCT 35.1* 32.0* 31.3* 31.3* 31.5* 32.0*  MCV 88.2 91.2 89.7 92.6 89.5 89.6  PLT 209 190 194 235 206 223   Basic Metabolic Panel: Recent Labs  Lab 01/09/23 0535 01/10/23 0701 01/11/23 0455 01/13/23 0510 01/14/23 0448  NA 138 138 137 137 135  K 3.6 3.4* 3.2* 3.7 3.5  CL 102 105 104 105 103  CO2 28 26 25 24 23   GLUCOSE 117* 128* 136* 121* 108*  BUN 10 10 11 11 11   CREATININE 0.96 0.95 1.02 0.90 0.86  CALCIUM 8.8* 8.7* 8.5* 9.0 8.9   Liver Function Tests: Recent Labs  Lab 01/08/23 1240 01/13/23 0510  AST 17 21  ALT 15 16  ALKPHOS 114 120  BILITOT 0.7 0.6  PROT 8.0 6.9  ALBUMIN 4.1 3.3*   CBG: No results for input(s): "GLUCAP" in the last 168 hours.  Discharge time spent: approximately 35 minutes spent on discharge counseling, evaluation of patient on day of discharge, and coordination of discharge planning with nursing, social work, pharmacy and case management  Signed: Alberteen [name], MD [facility] 01/14/2023

## 2023-01-14 NOTE — TOC Progression Note (Signed)
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 Unable to provide resources.     Expected Discharge Plan: Home/Self Care Barriers to Discharge: Continued Medical Work up  Expected Discharge Plan and Services   Discharge Planning Services: CM Consult   Living arrangements for the past 2 months: Single Family Home Expected Discharge Date: 01/14/23                                     Social Determinants of Health (SDOH) Interventions SDOH Screenings   Food Insecurity: No Food Insecurity (01/08/2023)  Housing: Low Risk  (01/08/2023)  Transportation Needs: No Transportation Needs (01/08/2023)  Utilities: Not At Risk (01/08/2023)  Depression (PHQ2-9): Low Risk  (05/18/2022)  Tobacco Use: Low Risk  (01/08/2023)    Readmission Risk Interventions     No data to display

## 2023-01-18 ENCOUNTER — Encounter: Payer: Self-pay | Admitting: Gastroenterology

## 2023-01-18 ENCOUNTER — Ambulatory Visit (INDEPENDENT_AMBULATORY_CARE_PROVIDER_SITE_OTHER): Payer: Medicare Other | Admitting: Gastroenterology

## 2023-01-18 ENCOUNTER — Telehealth: Payer: Self-pay

## 2023-01-18 VITALS — BP 140/80 | HR 68 | Ht 73.0 in | Wt 206.0 lb

## 2023-01-18 DIAGNOSIS — K5904 Chronic idiopathic constipation: Secondary | ICD-10-CM | POA: Diagnosis not present

## 2023-01-18 DIAGNOSIS — E440 Moderate protein-calorie malnutrition: Secondary | ICD-10-CM

## 2023-01-18 DIAGNOSIS — K5792 Diverticulitis of intestine, part unspecified, without perforation or abscess without bleeding: Secondary | ICD-10-CM | POA: Diagnosis not present

## 2023-01-18 NOTE — Telephone Encounter (Signed)
Patient to be referred to Inetta Fermo, MD for recurrent diverticulitis

## 2023-01-18 NOTE — Telephone Encounter (Signed)
Records faxed to [facility] [place]'s office at [facility]   [facility]: [phone] FAX: [phone]

## 2023-01-18 NOTE — Patient Instructions (Addendum)
VISIT SUMMARY:  During your visit, we discussed your ongoing abdominal discomfort related to diverticulitis and your current treatment plan. We also addressed your irregular bowel movements, dietary habits, and potential vitamin B deficiency. Additionally, we talked about the possibility of a surgical consultation and a colonoscopy in the future.  YOUR PLAN:  -DIVERTICULITIS: Diverticulitis is an inflammation or infection of small pouches that can form in your intestines. You should continue taking Augmentin and Flagyl as prescribed. Maintain your bowel regimen with Linzess in the morning, and Movantik and Miralax at bedtime, adjusting as needed based on your bowel movements.  -CONSTIPATION: Constipation is when you have infrequent or difficult bowel movements. Continue your bowel regimen with Linzess in the morning, and Movantik and Miralax at bedtime, adjusting as needed based on your bowel movements.  -NUTRITION: We discussed the importance of regular meals and protein intake. You should eat every 2-3 hours, including protein drinks between meals.  -VITAMIN B DEFICIENCY: A possible vitamin B deficiency can occur due to various reasons, including recent antibiotic use. Start taking a B complex supplement daily  -SURGICAL CONSULTATION: We discussed the potential for surgical intervention for your ongoing diverticulitis. Consider a consultation with a colorectal surgeon at [facility] for further discussion and evaluation.  -COLONOSCOPY: A colonoscopy is a procedure to examine the inside of your colon. We will defer this for now, but consider it if surgical intervention becomes more likely.  INSTRUCTIONS:  Please continue with your current medications and bowel regimen as discussed. Start taking a B complex supplement and we will check your B12 and folate levels. Consider scheduling a consultation with a colorectal surgeon at [facility] for further evaluation of your diverticulitis. We will defer the  colonoscopy for now, but keep it in mind for the future if needed.  We are referring you to [facility] - Inetta Fermo, MD.  They will contact you directly to schedule an appointment.  It may take a week or more before you hear from them.  Please feel free to contact us if you have not heard from them within 2 weeks and we will follow up on the referral.   I appreciate the opportunity to care for you. Thank you for choosing me and [facility],  Dr. Marsa Aris

## 2023-01-18 NOTE — Progress Notes (Signed)
Kevin Kass, MD    [id number]    04/25/1934  Primary Care Physician:Gupta, Freddie Breech, MD  Referring Physician: Mahlon Gammon, MD [address] [place],  [place] [address]   Chief complaint: Follow-up diverticulitis  Discussed the use of AI scribe software for clinical note transcription with the patient, who gave verbal consent to proceed.  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 He reported that the pain was not completely resolved, but had improved by approximately 50% since the last consultation.  He was hospitalized last week, on IV Zosyn for 7 days and was discharged on a regimen of Augmentin and Flagyl, and reported irregular bowel movements, which varied in frequency and were sometimes accompanied by cramping. The patient also reported using Linzess and Miralax to manage his bowel movements, adjusting the dosage based on his bowel activity.  The patient had recently been hospitalized, during which he reported regular bowel movements. However, upon physical examination, the patient showed tenderness in the abdomen, suggesting possible constipation. The patient also reported burping and hiccups, which he associated with his bowel issues.  The patient's diet was not discussed in detail, but he mentioned a preference for seafood and an aversion to vegetables. He also reported taking a multivitamin and was considering adding a B complex supplement to his regimen. The patient had a history of gallstones, but these were not currently causing any problems.  The patient had been considering a colonoscopy, but was not keen on the procedure. He was not concerned about the possibility of cancer, but was open to the procedure if it was deemed advantageous. The patient was also open to meeting with a surgeon to discuss potential surgical interventions for his  diverticulitis, but was not currently inclined towards surgery.       GI Hx:  Clinical history includes MGUS, A-fib on Eliquis, s/p AVR, CAD, CHF, history of renal artery stenosis on Pletal   CT Angio Abdomen Pelvis w/wo contrast 12-13-22 -VASCULAR Atherosclerosis of abdominal aorta is noted without aneurysm or dissection. No significant mesenteric or renal artery stenosis is noted. Aortic Atherosclerosis (ICD10-I70.0). -NON-VASCULAR Diverticulosis of descending and sigmoid colon is noted. Minimal inflammatory changes are noted involving the proximal sigmoid colon which were present on prior exam and may represent chronic sequela of previous inflammation, although acute inflammation cannot be excluded.   Stable moderate prostatic enlargement.   CT abdomen pelvis November 05, 2022 without contrast 1. Minimal stranding in the proximal sigmoid colon compatible with acute diverticulitis. No free air or free fluid is present. 2. Cholelithiasis without evidence of cholecystitis. 3. Stable 5 cm simple cyst at the lower pole of the right kidney. No follow-up imaging is recommended. JACR 2018 Feb; 264-273, Management of the Incidental Renal Mass on CT, Radio[employer] 2021; 814-848, Bosniak Classification of Cystic Renal Masses, Version 2019. 4. Cardiomegaly without failure. 5. Coronary artery disease. 6.  Aortic Atherosclerosis (ICD10-I70.0).   CT abdomen pelvis without contrast September 30, 2022 1. There is mild bowel wall thickening and faint adjacent fat stranding at the confluence of the sigmoid colon and descending colon. This could reflect early acute uncomplicated diverticulitis. 2. Bladder wall is circumferentially thickened with a trabeculated appearance. This could reflect sequela of chronic outlet obstruction but given faint adjacent fat stranding, recommend correlation with urinalysis to exclude superimposed infection. 3. Massive prostatomegaly.   CT Abdomen  Pelvis w  contrast 08-12-21  1. No acute CT findings of the abdomen or pelvis to explain lower abdominal pain. 2. Descending and sigmoid diverticulosis without evidence of acute diverticulitis. 3. Severe prostatomegaly. 4. Tiny gallstones and or sludge in the gallbladder. No evidence of acute cholecystitis. 5. Coronary artery disease.   CT Abdomen Pelvis wo contrast 10-18-19 -Mild to moderate acute sigmoid diverticulitis. No evidence of abscess or other complication. -Stable enlarged prostate and findings of chronic bladder outlet obstruction.  -Cholelithiasis. No radiographic evidence of cholecystitis.   Outpatient Encounter Medications as of 01/18/2023  Medication Sig   amLODipine (NORVASC) 2.5 MG tablet Take 0.5 tablets (1.25 mg total) by mouth daily. (Patient taking differently: Take 1.25 mg by mouth as needed.)   amoxicillin-clavulanate (AUGMENTIN) 875-125 MG tablet Take 1 tablet by mouth 2 (two) times daily.   apixaban (ELIQUIS) 5 MG TABS tablet Take 1 tablet (5 mg total) by mouth 2 (two) times daily.   butalbital-acetaminophen-caffeine (BAC) 50-325-40 MG tablet Take 1 tablet by mouth 3 (three) times daily.   butalbital-acetaminophen-caffeine (BAC) 50-325-40 MG tablet Take 1 tablet by mouth three times a day as needed pt aware of acetaminophen in Norco as well   cholecalciferol (VITAMIN D3) 25 MCG (1000 UNIT) tablet Take 1,000 Units by mouth daily.   cilostazol (PLETAL) 100 MG tablet Take 1 tablet (100 mg total) by mouth 2 (two) times daily.   Coenzyme Q10 200 MG TABS Take 200 mg by mouth daily.   desoximetasone (TOPICORT) 0.25 % cream Apply 1 Application topically 2 (two) times daily. (Patient taking differently: Apply 1 Application topically 2 (two) times daily as needed (dermatitis).)   diclofenac Sodium (VOLTAREN) 1 % GEL Apply 2 g topically 2 (two) times daily as needed (knee pain).   digoxin (LANOXIN) 0.125 MG tablet Take 1 tablet (0.125 mg total) by mouth daily.   diphenhydrAMINE  (BENADRYL) 25 MG tablet Take 2 tablets (50 mg total) by mouth one hour prior to procedure (Patient taking differently: Take 50 mg by mouth as needed.)   diphenhydrAMINE (BENADRYL) 50 MG tablet Take 1 tablet (50 mg total) by mouth at bedtime as needed for itching. Use with CT scan. Instructions given.   doxazosin (CARDURA) 4 MG tablet Take 1 tablet (4 mg total) by mouth daily. (Patient taking differently: Take 4 mg by mouth at bedtime.)   ezetimibe (ZETIA) 10 MG tablet Take 1 tablet (10 mg total) by mouth daily. (Patient taking differently: Take 10 mg by mouth at bedtime.)   furosemide (LASIX) 20 MG tablet Take 20 mg by mouth daily as needed for fluid.   HYDROcodone-acetaminophen (NORCO) 10-325 MG tablet Take 1 tablet by mouth three times a day as directed DX G89.4   linaclotide (LINZESS) 290 MCG CAPS capsule Take 1 capsule (290 mcg total) by mouth daily before breakfast.   LORazepam (ATIVAN) 1 MG tablet Take 1 tablet (1 mg) by mouth at bedtime, and 0.5 tablet (0.5 mg) daily as needed for anxiety.   magnesium oxide (MAG-OX) 400 MG tablet Take 1 tablet (400 mg total) by mouth daily.   metoprolol succinate (TOPROL XL) 25 MG 24 hr tablet Take 3 tablets (75 mg total) by mouth 2 (two) times daily. (Patient taking differently: Take 50 mg by mouth 2 (two) times daily.)   metroNIDAZOLE (FLAGYL) 500 MG tablet Take 1 tablet (500 mg total) by mouth 3 (three) times daily.   midodrine (PROAMATINE) 2.5 MG tablet Take 1 tablet (2.5 mg total) by mouth as needed.  Multiple Vitamins-Minerals (MULTIVITAMIN WITH MINERALS) tablet Take 1 tablet by mouth daily.   naloxegol oxalate (MOVANTIK) 25 MG TABS tablet Take 1 tablet (25 mg total) by mouth daily. Take 12.5 for 2 weeks if tolerated can increase to 25 mg daily. (Patient taking differently: Take 25 mg by mouth at bedtime.)   nitroGLYCERIN (NITROSTAT) 0.4 MG SL tablet Place 1 tablet (0.4 mg total) under the tongue every 5 (five) minutes as needed for chest pain.    Omeprazole-Sodium Bicarbonate (ZEGERID PO) Take 1 capsule by mouth daily as needed (acid reflux).   pantoprazole (PROTONIX) 40 MG tablet Take 1 tablet (40 mg total) by mouth 2 (two) times daily. (Patient taking differently: Take 40 mg by mouth daily.)   Polyethyl Glycol-Propyl Glycol (SYSTANE OP) Place 1 drop into both eyes daily as needed (dry eyes).   polyethylene glycol (MIRALAX / GLYCOLAX) 17 g packet Take 17 g by mouth daily.   potassium chloride SA (KLOR-CON M) 20 MEQ tablet Take 1 tablet (20 mEq total) by mouth daily.   predniSONE (DELTASONE) 5 MG tablet Take 5 mg by mouth daily with breakfast.   rosuvastatin (CRESTOR) 20 MG tablet Take 1 tablet (20 mg total) by mouth at bedtime. Please keep scheduled appointment for future refills.   sennosides-docusate sodium (SENOKOT-S) 8.6-50 MG tablet Take 1 tablet by mouth 2 (two) times daily.   [DISCONTINUED] pregabalin (LYRICA) 75 MG capsule Take 75 mg by mouth 2 (two) times daily.   No facility-administered encounter medications on file as of 01/18/2023.    Allergies as of 01/18/2023 - Review Complete 01/09/2023  Allergen Reaction Noted   Contrast media [iodinated contrast media] Hives 02/01/2011   Gadolinium derivatives Hives 10/09/2012   Iodine-131 Hives 10/07/2021   Metrizamide Hives 02/01/2011   Tetanus toxoids  10/07/2021    Past Medical History:  Diagnosis Date   Aortic stenosis    moderate aortic stenosis   Arthritis    Benign prostatic hypertrophy    Chronic systolic heart failure (HCC)    Chronotropic incompetence with sinus node dysfunction    Status post Guidant dual-mode, dual-pacing, dual-sensing  pacemaker   implantation now programmed to AAI with recent generator change.   Coronary artery disease    status post multiple prior percutaneous coronary interventions, microvascular angina per Dr [name]   Diverticulitis sigmoid colon recurrent    Dysfunctional autonomic nervous system    Dyspnea    Heart murmur    History  of primary hypertension    Hypercoagulable state (HCC)    chronically anticoagulated with coumadin   Hyperlipidemia    Hyperthyroidism    Hypothyroidism    Dr. [name]   MGUS (monoclonal gammopathy of unknown significance) 02/17/2013   Ocular myasthenia (HCC)    Osteoarthritis    Paroxysmal atrial fibrillation (HCC)    DR. Riley Kill,    Prediabetes 09/21/2017   Stroke (HCC)    1990    Past Surgical History:  Procedure Laterality Date   AORTIC VALVE REPLACEMENT  03/15/2011   Procedure: AORTIC VALVE REPLACEMENT (AVR);  Surgeon: Alleen Borne, MD;  Location: [facility] OR;  Service: Open Heart Surgery;  Laterality: N/A;   APPENDECTOMY     CARDIAC CATHETERIZATION     13   CARDIOVERSION     CARDIOVERSION  04/15/2011   Procedure: CARDIOVERSION;  Surgeon: Marca Ancona, MD;  Location: [facility] ENDOSCOPY;  Service: Cardiovascular;  Laterality: N/A;   CARDIOVERSION N/A 09/11/2014   Procedure: CARDIOVERSION;  Surgeon: Quintella Reichert, MD;  Location: [facility] ENDOSCOPY;  Service: Cardiovascular;  Laterality: N/A;   CARDIOVERSION N/A 06/27/2015   Procedure: CARDIOVERSION;  Surgeon: Vesta Mixer, MD;  Location: [facility] ENDOSCOPY;  Service: Cardiovascular;  Laterality: N/A;   CARDIOVERSION N/A 07/04/2015   Procedure: CARDIOVERSION;  Surgeon: Marinus Maw, MD;  Location: [facility] ENDOSCOPY;  Service: Cardiovascular;  Laterality: N/A;   CARDIOVERSION N/A 04/13/2017   Procedure: CARDIOVERSION;  Surgeon: Quintella Reichert, MD;  Location: [facility] ENDOSCOPY;  Service: Cardiovascular;  Laterality: N/A;   COLONOSCOPY     CORONARY STENT INTERVENTION N/A 10/15/2019   Procedure: CORONARY STENT INTERVENTION;  Surgeon: Corky Crafts, MD;  Location: [facility] INVASIVE CV LAB;  Service: Cardiovascular;  Laterality: N/A;   EP IMPLANTABLE DEVICE N/A 06/16/2015   Procedure: Pacemaker Implant;  Surgeon: Marinus Maw, MD;  Location: [facility] INVASIVE CV LAB;  Service: Cardiovascular;  Laterality: N/A;   hemrrhoidectomy     LEFT AND RIGHT HEART  CATHETERIZATION WITH CORONARY ANGIOGRAM Bilateral 02/01/2011   Procedure: LEFT AND RIGHT HEART CATHETERIZATION WITH CORONARY ANGIOGRAM;  Surgeon: Herby Abraham, MD;  Location: [facility] CATH LAB;  Service: Cardiovascular;  Laterality: Bilateral;   LEFT HEART CATH AND CORONARY ANGIOGRAPHY N/A 10/15/2019   Procedure: LEFT HEART CATH AND CORONARY ANGIOGRAPHY;  Surgeon: Corky Crafts, MD;  Location: [facility] INVASIVE CV LAB;  Service: Cardiovascular;  Laterality: N/A;   LEFT HEART CATH AND CORONARY ANGIOGRAPHY N/A 10/07/2020   Procedure: LEFT HEART CATH AND CORONARY ANGIOGRAPHY;  Surgeon: Tonny Bollman, MD;  Location: [facility] INVASIVE CV LAB;  Service: Cardiovascular;  Laterality: N/A;   LEFT HEART CATH AND CORONARY ANGIOGRAPHY N/A 07/06/2022   Procedure: LEFT HEART CATH AND CORONARY ANGIOGRAPHY;  Surgeon: Tonny Bollman, MD;  Location: [facility] INVASIVE CV LAB;  Service: Cardiovascular;  Laterality: N/A;   LUMBAR LAMINECTOMY/DECOMPRESSION MICRODISCECTOMY Right 02/23/2021   Procedure: Right Lumbar four-five microdiscectomy;  Surgeon: Tia Alert, MD;  Location: [facility] OR;  Service: Neurosurgery;  Laterality: Right;   MAZE  03/15/2011   Procedure: MAZE;  Surgeon: Alleen Borne, MD;  Location: [facility] OR;  Service: Open Heart Surgery;  Laterality: N/A;   MOHS SURGERY  09/2021   PACEMAKER INSERTION  1991   Guidant PPM, most recent Generator Change by Dr [name] was 08/22/06   RIGHT/LEFT HEART CATH AND CORONARY ANGIOGRAPHY N/A 07/06/2016   Procedure: Right/Left Heart Cath and Coronary Angiography;  Surgeon: Tonny Bollman, MD;  Location: [facility] INVASIVE CV LAB;  Service: Cardiovascular;  Laterality: N/A;   TEE WITHOUT CARDIOVERSION  04/15/2011   Procedure: TRANSESOPHAGEAL ECHOCARDIOGRAM (TEE);  Surgeon: Marca Ancona, MD;  Location: [facility] ENDOSCOPY;  Service: Cardiovascular;  Laterality: N/A;   TEE WITHOUT CARDIOVERSION N/A 09/11/2014   Procedure: TRANSESOPHAGEAL ECHOCARDIOGRAM (TEE);  Surgeon: Quintella Reichert, MD;  Location: [facility]  ENDOSCOPY;  Service: Cardiovascular;  Laterality: N/A;    Family History  Problem Relation Age of Onset   Heart disease Brother        Twin brother has coronary disease and recent AVR for AS   CAD Brother    Atrial fibrillation Brother    Sarcoidosis Brother    Depression Daughter    Anorexia nervosa Daughter    Hypertension Son    Anesthesia problems Neg Hx    Hypotension Neg Hx    Malignant hyperthermia Neg Hx    Pseudochol deficiency Neg Hx     Social History   Socioeconomic History   Marital status: Married    Spouse name: Dondra Spry    Number of children: 3   Years of  education: Not on file   Highest education level: Not on file  Occupational History   Occupation: Retired    Comment: Physician  Tobacco Use   Smoking status: Never   Smokeless tobacco: Never  Vaping Use   Vaping status: Never Used  Substance and Sexual Activity   Alcohol use: No    Alcohol/week: 0.0 standard drinks of alcohol   Drug use: No   Sexual activity: Not Currently  Other Topics Concern   Not on file  Social History Narrative   Married to [place]. Has grown children   Retired Proofreader MD      Never smoker no alcohol      Social Determinants of Corporate investment banker Strain: Not on file  Food Insecurity: No Food Insecurity (01/08/2023)   Hunger Vital Sign    Worried About Running Out of Food in the Last Year: Never true    Ran Out of Food in the Last Year: Never true  Transportation Needs: No Transportation Needs (01/08/2023)   PRAPARE - Administrator, Civil Service (Medical): No    Lack of Transportation (Non-Medical): No  Physical Activity: Not on file  Stress: Not on file  Social Connections: Not on file  Intimate Partner Violence: Not At Risk (01/08/2023)   Humiliation, Afraid, Rape, and Kick questionnaire    Fear of Current or Ex-Partner: No    Emotionally Abused: No    Physically Abused: No    Sexually Abused: No      Review of systems: All other review  of systems negative except as mentioned in the HPI.   Physical Exam: Vitals:   01/18/23 0842 01/18/23 0949  BP: (!) 150/90 (!) 140/80  Pulse: 68    Body mass index is 27.18 kg/m. 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 Abdominal examination reveals tenderness and possible backup in the transverse colon. -Continue Augmentin and Flagyl as prescribed to complete the antibiotic course. -Continue bowel regimen with Linzess 290 mcg in the morning, and Movantik 25 mg and Miralax 1 capful at bedtime. Adjust as needed based on bowel movement frequency with goal 1-2 soft bowel movements per day.  Surgical Consultation Discussed potential surgical intervention for recurrent, smoldering sigmoid diverticulitis. -Consider consultation with colorectal surgeon Dr. Drue Dun at [facility] for further discussion and evaluation for possible segmental sigmoid resection.  He wants to avoid surgery but is open to discussing the options, pros and cons involved with surgery  Colonoscopy Discussed potential benefits and drawbacks of procedure. -Defer colonoscopy for now, but consider if surgical intervention becomes more likely.   Constipation Variable bowel movement frequency with occasional cramping. Possible  backup in the transverse colon noted on examination. -Continue bowel regimen with Linzess in the morning, and Movantik and Miralax at bedtime. Adjust as needed based on  bowel movement frequency.  Nutrition Discussed importance of regular meals and protein intake. -Eat every 2-3 hours, including protein drinks between meals.  Vitamin B deficiency Possible deficiency based on symptoms and recent antibiotic use. -Start B complex supplement.    Return in 2 to 3 months   This visit required 40 minutes of patient care (this includes precharting, chart review, review of results, face-to-face time used for counseling as well as treatment plan and follow-up. The patient was provided an opportunity to ask questions and all were answered. The patient agreed with the plan and demonstrated an understanding of the instructions.  Iona Beard , MD    CC: Mahlon Gammon, MD

## 2023-01-19 ENCOUNTER — Encounter: Payer: Self-pay | Admitting: Oncology

## 2023-01-20 ENCOUNTER — Other Ambulatory Visit: Payer: Self-pay | Admitting: Internal Medicine

## 2023-01-20 ENCOUNTER — Other Ambulatory Visit: Payer: Self-pay

## 2023-01-20 ENCOUNTER — Other Ambulatory Visit (HOSPITAL_BASED_OUTPATIENT_CLINIC_OR_DEPARTMENT_OTHER): Payer: Self-pay

## 2023-01-20 DIAGNOSIS — D649 Anemia, unspecified: Secondary | ICD-10-CM

## 2023-01-20 MED ORDER — DESOXIMETASONE 0.25 % EX CREA
1.0000 | TOPICAL_CREAM | Freq: Two times a day (BID) | CUTANEOUS | 1 refills | Status: DC
  Filled 2023-01-20: qty 60, 30d supply, fill #0
  Filled 2023-02-14: qty 60, 30d supply, fill #1

## 2023-01-21 ENCOUNTER — Other Ambulatory Visit: Payer: Self-pay

## 2023-01-21 NOTE — Telephone Encounter (Signed)
Inbound call from [facility] requesting a call back at [phone] regarding referral. States there are a few questions she would like to go over. Please advise, thank you.

## 2023-01-24 NOTE — Telephone Encounter (Signed)
Called and spoke to referral coordinator, Helmut Muster. She asked that we fax a demographic sheet for the patient to her at [phone].  Done

## 2023-01-24 NOTE — Telephone Encounter (Signed)
Called and left message to call back.

## 2023-01-28 ENCOUNTER — Other Ambulatory Visit (HOSPITAL_BASED_OUTPATIENT_CLINIC_OR_DEPARTMENT_OTHER): Payer: Self-pay

## 2023-01-28 ENCOUNTER — Other Ambulatory Visit: Payer: Self-pay

## 2023-02-01 ENCOUNTER — Encounter: Payer: Self-pay | Admitting: Internal Medicine

## 2023-02-01 ENCOUNTER — Encounter: Payer: Self-pay | Admitting: Gastroenterology

## 2023-02-01 ENCOUNTER — Non-Acute Institutional Stay: Payer: Medicare Other | Admitting: Internal Medicine

## 2023-02-01 VITALS — BP 178/98 | HR 69 | Temp 97.1°F | Ht 73.0 in | Wt 206.0 lb

## 2023-02-01 DIAGNOSIS — I4819 Other persistent atrial fibrillation: Secondary | ICD-10-CM | POA: Diagnosis not present

## 2023-02-01 DIAGNOSIS — Z7952 Long term (current) use of systemic steroids: Secondary | ICD-10-CM

## 2023-02-01 DIAGNOSIS — I25118 Atherosclerotic heart disease of native coronary artery with other forms of angina pectoris: Secondary | ICD-10-CM | POA: Diagnosis not present

## 2023-02-01 DIAGNOSIS — E785 Hyperlipidemia, unspecified: Secondary | ICD-10-CM

## 2023-02-01 DIAGNOSIS — D472 Monoclonal gammopathy: Secondary | ICD-10-CM | POA: Diagnosis not present

## 2023-02-01 DIAGNOSIS — K5792 Diverticulitis of intestine, part unspecified, without perforation or abscess without bleeding: Secondary | ICD-10-CM

## 2023-02-01 DIAGNOSIS — M158 Other polyosteoarthritis: Secondary | ICD-10-CM | POA: Diagnosis not present

## 2023-02-01 DIAGNOSIS — M8580 Other specified disorders of bone density and structure, unspecified site: Secondary | ICD-10-CM

## 2023-02-01 DIAGNOSIS — G894 Chronic pain syndrome: Secondary | ICD-10-CM | POA: Diagnosis not present

## 2023-02-01 DIAGNOSIS — K5903 Drug induced constipation: Secondary | ICD-10-CM

## 2023-02-01 DIAGNOSIS — N401 Enlarged prostate with lower urinary tract symptoms: Secondary | ICD-10-CM | POA: Diagnosis not present

## 2023-02-01 DIAGNOSIS — E1159 Type 2 diabetes mellitus with other circulatory complications: Secondary | ICD-10-CM | POA: Diagnosis not present

## 2023-02-01 DIAGNOSIS — I10 Essential (primary) hypertension: Secondary | ICD-10-CM

## 2023-02-01 NOTE — Progress Notes (Signed)
Location:  [facility] features editor of Service:  Clinic (12)  Provider:   Code Status: DNR Goals of Care:     02/01/2023   10:08 AM  Advanced Directives  Does Patient Have a Medical Advance Directive? Yes  Type of Advance Directive Out of facility DNR (pink MOST or yellow form)  Does patient want to make changes to medical advance directive? No - Patient declined     Chief Complaint  Patient presents with   Medical Management of Chronic Issues    4 Month follow up   Quality Metric Gaps    To discuss need for Tdap, Covid, Zoster, Pneumonia and AWV    HPI: Patient is a 88 y.o. [name] seen today for medical management of chronic diseases.   Lives in [place] with his Wife Has Personal nurse who helps with meds  He was admitted in the hospital from 11/9 to 11/15 for acute diverticulitis not responding to p.o. antibiotics Was treated with 7 days of IV Zosyn followed by 7 days of Augmentin and Flagyl which she is finishing at this point.  He is also on a very various medications to help his constipation including Linzess and MiraLAX and Movantik as needed Patient was recently seen by GI and has a referral to see the surgeon.  He is not sure if he wants to go through with surgery. He has lost almost 5-8 lbs   Diabetes Prednisone use and Diet Needs follow up of his A1C Diet controlled   Hypertension  Fluctuates His nurse gives him extra beta-blocker as needed for high blood pressure   Arthritis He is stable on Prednisone and Norco Seeing Dr Judith Blonder  Per her note he does not have Inflammatory Arthritis and management is only pain control His DEXA was negative for osteoporosis RFN -1.5 LFN -1.4 He is seeing Dr. Vear Clock now who is managing his pain with Norco and Fioricet  Chronic issues  Has h/o CAD, s/p Stent  Follows with Dr. 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 Pearlean Brownie he needs to stay on it 2D echo which showed a EF of 60% with inferior wall hypokinesis    H/o Skin cancers s/p Mohrs surgery for Melanoma    Past Medical History:  Diagnosis Date   Aortic stenosis    moderate aortic stenosis   Arthritis    Benign prostatic hypertrophy    Chronic systolic heart failure (HCC)    Chronotropic incompetence with sinus node dysfunction    Status post Guidant dual-mode, dual-pacing, dual-sensing  pacemaker   implantation now programmed to AAI with recent generator change.   Coronary artery disease    status post multiple prior percutaneous coronary interventions, microvascular angina per Dr [name]   Diverticulitis sigmoid colon recurrent    Dysfunctional autonomic nervous system    Dyspnea    Heart murmur    History of primary hypertension    Hypercoagulable state (HCC)    chronically anticoagulated with coumadin   Hyperlipidemia  Hyperthyroidism    Hypothyroidism    Dr. [name]   MGUS (monoclonal gammopathy of unknown significance) 02/17/2013   Ocular myasthenia (HCC)    Osteoarthritis    Paroxysmal atrial fibrillation (HCC)    DR. Riley Kill,    Prediabetes 09/21/2017   Stroke (HCC)    1990    Past Surgical History:  Procedure Laterality Date   AORTIC VALVE REPLACEMENT  03/15/2011   Procedure: AORTIC VALVE REPLACEMENT (AVR);  Surgeon: Alleen Borne, MD;  Location: [facility] OR;  Service: Open Heart Surgery;  Laterality: N/A;   APPENDECTOMY     CARDIAC CATHETERIZATION     13   CARDIOVERSION     CARDIOVERSION  04/15/2011   Procedure: CARDIOVERSION;  Surgeon:  Marca Ancona, MD;  Location: [facility] ENDOSCOPY;  Service: Cardiovascular;  Laterality: N/A;   CARDIOVERSION N/A 09/11/2014   Procedure: CARDIOVERSION;  Surgeon: Quintella Reichert, MD;  Location: [facility] ENDOSCOPY;  Service: Cardiovascular;  Laterality: N/A;   CARDIOVERSION N/A 06/27/2015   Procedure: CARDIOVERSION;  Surgeon: Vesta Mixer, MD;  Location: [facility] ENDOSCOPY;  Service: Cardiovascular;  Laterality: N/A;   CARDIOVERSION N/A 07/04/2015   Procedure: CARDIOVERSION;  Surgeon: Marinus Maw, MD;  Location: [facility] ENDOSCOPY;  Service: Cardiovascular;  Laterality: N/A;   CARDIOVERSION N/A 04/13/2017   Procedure: CARDIOVERSION;  Surgeon: Quintella Reichert, MD;  Location: [facility] ENDOSCOPY;  Service: Cardiovascular;  Laterality: N/A;   COLONOSCOPY     CORONARY STENT INTERVENTION N/A 10/15/2019   Procedure: CORONARY STENT INTERVENTION;  Surgeon: Corky Crafts, MD;  Location: [facility] INVASIVE CV LAB;  Service: Cardiovascular;  Laterality: N/A;   EP IMPLANTABLE DEVICE N/A 06/16/2015   Procedure: Pacemaker Implant;  Surgeon: Marinus Maw, MD;  Location: [facility] INVASIVE CV LAB;  Service: Cardiovascular;  Laterality: N/A;   hemrrhoidectomy     LEFT AND RIGHT HEART CATHETERIZATION WITH CORONARY ANGIOGRAM Bilateral 02/01/2011   Procedure: LEFT AND RIGHT HEART CATHETERIZATION WITH CORONARY ANGIOGRAM;  Surgeon: Herby Abraham, MD;  Location: [facility] CATH LAB;  Service: Cardiovascular;  Laterality: Bilateral;   LEFT HEART CATH AND CORONARY ANGIOGRAPHY N/A 10/15/2019   Procedure: LEFT HEART CATH AND CORONARY ANGIOGRAPHY;  Surgeon: Corky Crafts, MD;  Location: [facility] INVASIVE CV LAB;  Service: Cardiovascular;  Laterality: N/A;   LEFT HEART CATH AND CORONARY ANGIOGRAPHY N/A 10/07/2020   Procedure: LEFT HEART CATH AND CORONARY ANGIOGRAPHY;  Surgeon: Tonny Bollman, MD;  Location: [facility] INVASIVE CV LAB;  Service: Cardiovascular;  Laterality: N/A;   LEFT HEART CATH AND CORONARY ANGIOGRAPHY N/A 07/06/2022   Procedure: LEFT HEART CATH  AND CORONARY ANGIOGRAPHY;  Surgeon: Tonny Bollman, MD;  Location: [facility] INVASIVE CV LAB;  Service: Cardiovascular;  Laterality: N/A;   LUMBAR LAMINECTOMY/DECOMPRESSION MICRODISCECTOMY Right 02/23/2021   Procedure: Right Lumbar four-five microdiscectomy;  Surgeon: Tia Alert, MD;  Location: [facility] OR;  Service: Neurosurgery;  Laterality: Right;   MAZE  03/15/2011   Procedure: MAZE;  Surgeon: Alleen Borne, MD;  Location: [facility] OR;  Service: Open Heart Surgery;  Laterality: N/A;   MOHS SURGERY  09/2021   PACEMAKER INSERTION  1991   Guidant PPM, most recent Generator Change by Dr [name] was 08/22/06   RIGHT/LEFT HEART CATH AND CORONARY ANGIOGRAPHY N/A 07/06/2016   Procedure: Right/Left Heart Cath and Coronary Angiography;  Surgeon: Tonny Bollman, MD;  Location: [facility] INVASIVE CV LAB;  Service: Cardiovascular;  Laterality: N/A;   TEE WITHOUT CARDIOVERSION  04/15/2011   Procedure: TRANSESOPHAGEAL ECHOCARDIOGRAM (TEE);  Surgeon: Marca Ancona, MD;  Location: [facility] ENDOSCOPY;  Service: Cardiovascular;  Laterality: N/A;   TEE WITHOUT CARDIOVERSION N/A 09/11/2014   Procedure: TRANSESOPHAGEAL ECHOCARDIOGRAM (TEE);  Surgeon: Quintella Reichert, MD;  Location: [facility] ENDOSCOPY;  Service: Cardiovascular;  Laterality: N/A;    Allergies  Allergen Reactions   Contrast Media [Iodinated Contrast Media] Hives   Gadolinium Derivatives Hives   Iodine-131 Hives   Metrizamide Hives   Tetanus Toxoids     Redness     Outpatient Encounter Medications as of 02/01/2023  Medication Sig   amLODipine (NORVASC) 2.5 MG tablet Take 0.5 tablets (1.25 mg total) by mouth daily. (Patient taking differently: Take 2.5 mg by mouth daily.)   apixaban (ELIQUIS) 5 MG TABS tablet Take 1 tablet (5 mg total) by mouth 2 (two) times daily.   butalbital-acetaminophen-caffeine (BAC) 50-325-40 MG tablet Take 1 tablet by mouth 3 (three) times daily as needed.   cholecalciferol (VITAMIN D3) 25 MCG (1000 UNIT) tablet Take 1,000 Units by mouth daily.    cilostazol (PLETAL) 100 MG tablet Take 1 tablet (100 mg total) by mouth 2 (two) times daily.   Coenzyme Q10 200 MG TABS Take 200 mg by mouth daily.   desoximetasone (TOPICORT) 0.25 % cream Apply 1 Application topically 2 (two) times daily.   diclofenac Sodium (VOLTAREN) 1 % GEL Apply 2 g topically 2 (two) times daily as needed (knee pain).   digoxin (LANOXIN) 0.125 MG tablet Take 1 tablet (0.125 mg total) by mouth daily.   doxazosin (CARDURA) 4 MG tablet Take 1 tablet (4 mg total) by mouth daily. (Patient taking differently: Take 4 mg by mouth at bedtime.)   ezetimibe (ZETIA) 10 MG tablet Take 1 tablet (10 mg total) by mouth daily. (Patient taking differently: Take 10 mg by mouth at bedtime.)   furosemide (LASIX) 20 MG tablet Take 20 mg by mouth daily as needed for fluid.   HYDROcodone-acetaminophen (NORCO) 10-325 MG tablet Take 1 tablet by mouth three times a day as directed DX G89.4   linaclotide (LINZESS) 290 MCG CAPS capsule Take 1 capsule (290 mcg total) by mouth daily before breakfast.   LORazepam (ATIVAN) 1 MG tablet Take 1 tablet (1 mg) by mouth at bedtime, and 0.5 tablet (0.5 mg) daily as needed for anxiety.   magnesium oxide (MAG-OX) 400 MG tablet Take 1 tablet (400 mg total) by mouth daily.   metoprolol succinate (TOPROL XL) 25 MG 24 hr tablet Take 3 tablets (75 mg total) by mouth 2 (two) times daily. (Patient taking differently: Take 50 mg by mouth 2 (two) times daily.)   midodrine (PROAMATINE) 2.5 MG tablet Take 1 tablet (2.5 mg total) by mouth as needed.   Multiple Vitamins-Minerals (MULTIVITAMIN WITH MINERALS) tablet Take 1 tablet by mouth daily.   naloxegol oxalate (MOVANTIK) 25 MG TABS tablet Take 1 tablet (25 mg total) by mouth daily. Take 12.5 for 2 weeks if tolerated can increase to 25 mg daily. (Patient taking differently: Take 25 mg by mouth at bedtime.)   nitroGLYCERIN (NITROSTAT) 0.4 MG SL tablet Place 1 tablet (0.4 mg total) under the tongue every 5 (five) minutes as needed  for chest pain.   Omeprazole-Sodium Bicarbonate (ZEGERID PO) Take 1 capsule by mouth daily as needed (acid reflux).   pantoprazole (PROTONIX) 40 MG tablet Take 1 tablet (40 mg total) by mouth 2 (two) times daily. (Patient taking differently: Take 40 mg by mouth daily.)   Polyethyl Glycol-Propyl Glycol (SYSTANE OP) Place 1 drop into both eyes daily as needed (dry eyes).   polyethylene glycol (MIRALAX /  GLYCOLAX) 17 g packet Take 17 g by mouth daily.   potassium chloride SA (KLOR-CON M) 20 MEQ tablet Take 1 tablet (20 mEq total) by mouth daily.   predniSONE (DELTASONE) 5 MG tablet Take 5 mg by mouth daily with breakfast.   rosuvastatin (CRESTOR) 20 MG tablet Take 1 tablet (20 mg total) by mouth at bedtime. Please keep scheduled appointment for future refills.   sennosides-docusate sodium (SENOKOT-S) 8.6-50 MG tablet Take 1 tablet by mouth 2 (two) times daily.   [DISCONTINUED] metroNIDAZOLE (FLAGYL) 500 MG tablet Take 1 tablet (500 mg total) by mouth 3 (three) times daily.   [DISCONTINUED] amoxicillin-clavulanate (AUGMENTIN) 875-125 MG tablet Take 1 tablet by mouth 2 (two) times daily.   [DISCONTINUED] butalbital-acetaminophen-caffeine (BAC) 50-325-40 MG tablet Take 1 tablet by mouth 3 (three) times daily.   [DISCONTINUED] diphenhydrAMINE (BENADRYL) 25 MG tablet Take 2 tablets (50 mg total) by mouth one hour prior to procedure (Patient taking differently: Take 50 mg by mouth as needed.)   [DISCONTINUED] diphenhydrAMINE (BENADRYL) 50 MG tablet Take 1 tablet (50 mg total) by mouth at bedtime as needed for itching. Use with CT scan. Instructions given.   [DISCONTINUED] pregabalin (LYRICA) 75 MG capsule Take 75 mg by mouth 2 (two) times daily.   No facility-administered encounter medications on file as of 02/01/2023.    Review of Systems:  Review of Systems  Constitutional:  Positive for activity change and appetite change. Negative for unexpected weight change.  HENT: Negative.    Respiratory:   Negative for cough and shortness of breath.   Cardiovascular:  Negative for leg swelling.  Gastrointestinal:  Positive for abdominal pain and constipation.  Genitourinary:  Positive for frequency.  Musculoskeletal:  Positive for arthralgias and myalgias. Negative for gait problem.  Skin: Negative.  Negative for rash.  Neurological:  Negative for dizziness and weakness.  Psychiatric/Behavioral:  Negative for confusion and sleep disturbance.   All other systems reviewed and are negative.   Health Maintenance  Topic Date Due   Medicare Annual Wellness (AWV)  Never done   Zoster Vaccines- Shingrix (1 of 2) Never done   Pneumonia Vaccine 36+ Years old (1 of 1 - PCV) Never done   DTaP/Tdap/Td (2 - Td or Tdap) 10/24/2014   COVID-19 Vaccine (4 - 2023-24 season) 10/31/2022   INFLUENZA VACCINE  Completed   HPV VACCINES  Aged Out    Physical Exam: Vitals:   02/01/23 1007 02/01/23 1009  BP: (!) 176/98 (!) 178/98  Pulse: 69   Temp: (!) 97.1 F (36.2 C)   SpO2: 97%   Weight: 206 lb (93.4 kg)   Height: 6\' 1"  (1.854 m)    Body mass index is 27.18 kg/m. Physical Exam Vitals reviewed.  Constitutional:      Appearance: Normal appearance.  HENT:     Head: Normocephalic.     Nose: Nose normal.     Mouth/Throat:     Mouth: Mucous membranes are moist.     Pharynx: Oropharynx is clear.  Eyes:     Pupils: Pupils are equal, round, and reactive to light.  Cardiovascular:     Rate and Rhythm: Normal rate. Rhythm irregular.     Pulses: Normal pulses.     Heart sounds: No murmur heard. Pulmonary:     Effort: Pulmonary effort is normal. No respiratory distress.     Breath sounds: Normal breath sounds. No rales.  Abdominal:     General: Abdomen is flat. Bowel sounds are normal.  Palpations: Abdomen is soft.  Musculoskeletal:        General: No swelling.     Cervical back: Neck supple.  Skin:    General: Skin is warm.  Neurological:     General: No focal deficit present.     Mental  Status: He is alert and oriented to person, place, and time.  Psychiatric:        Mood and Affect: Mood normal.        Thought Content: Thought content normal.     Labs reviewed: Basic Metabolic Panel: Recent Labs    08/27/22 1120 09/30/22 1151 01/11/23 0455 01/13/23 0510 01/14/23 0448  NA 139   < > 137 137 135  K 3.9   < > 3.2* 3.7 3.5  CL 100   < > 104 105 103  CO2 29   < > 25 24 23   GLUCOSE 147*   < > 136* 121* 108*  BUN 13   < > 11 11 11   CREATININE 1.10   < > 1.02 0.90 0.86  CALCIUM 9.6   < > 8.5* 9.0 8.9  TSH 4.506*  --   --   --   --    < > = values in this interval not displayed.   Liver Function Tests: Recent Labs    08/27/22 1120 01/08/23 1240 01/13/23 0510  AST 17 17 21   ALT 13 15 16   ALKPHOS 94 114 120  BILITOT 0.7 0.7 0.6  PROT 7.8 8.0 6.9  ALBUMIN 4.2 4.1 3.3*   Recent Labs    01/08/23 1240  LIPASE 12   No results for input(s): "AMMONIA" in the last 8760 hours. CBC: Recent Labs    01/08/23 1240 01/09/23 0535 01/10/23 0701 01/11/23 0452 01/13/23 0510 01/14/23 0448  WBC 9.1   < > 5.9 6.6 6.1 6.4  NEUTROABS 7.6  --  4.2 4.2  --   --   HGB 11.4*   < > 10.1* 9.9* 10.3* 10.4*  HCT 35.1*   < > 31.3* 31.3* 31.5* 32.0*  MCV 88.2   < > 89.7 92.6 89.5 89.6  PLT 209   < > 194 235 206 223   < > = values in this interval not displayed.   Lipid Panel: No results for input(s): "CHOL", "HDL", "LDLCALC", "TRIG", "CHOLHDL", "LDLDIRECT" in the last 8760 hours. Lab Results  Component Value Date   HGBA1C 7.0 (H) 05/28/2022    Procedures since last visit: CT ABDOMEN PELVIS WO CONTRAST  Result Date: 01/08/2023 CLINICAL DATA:  Left lower quadrant abdominal pain. EXAM: CT ABDOMEN AND PELVIS WITHOUT CONTRAST TECHNIQUE: Multidetector CT imaging of the abdomen and pelvis was performed following the standard protocol without IV contrast. RADIATION DOSE REDUCTION: This exam was performed according to the departmental dose-optimization program which includes  automated exposure control, adjustment of the mA and/or kV according to patient size and/or use of iterative reconstruction technique. COMPARISON:  12/13/2022 FINDINGS: Lower chest: The lung bases are clear of acute process. No pleural effusion or pulmonary lesions. The heart is normal in size. No pericardial effusion. The distal esophagus and aorta are unremarkable. Hepatobiliary: No hepatic lesions or intrahepatic biliary dilatation. Innumerable small layering gallstones in the gallbladder. No findings for acute cholecystitis. Pancreas: No mass, inflammation or ductal dilatation. Spleen: Normal size.  No focal lesions. Adrenals/Urinary Tract: The adrenal glands are normal. Stable simple right renal cyst not requiring any further imaging evaluation or follow-up. No renal or obstructing ureteral calculi. Mild irregular bladder wall  thickening appears stable. Suspect small cellules and trabeculation. No bladder mass or calculi. Moderate bladder distension. Estimated volume is 600 cc. Stomach/Bowel: The stomach, duodenum and small are unremarkable. Focus of acute diverticulitis involving the upper sigmoid colon with marked wall thickening and pericolonic inflammatory changes. No free air or abscess. Stable underlying diverticulosis. Vascular/Lymphatic: Stable age related atherosclerotic calcification involving the aorta and branch vessels but no aneurysm. No abdominal or pelvic lymphadenopathy. No inguinal adenopathy. Reproductive: Enlarged prostate gland with median lobe hypertrophy impressing on the base of the bladder. The seminal vesicles are unremarkable. Other: Small amount of free pelvic fluid likely related to the diverticulitis. Musculoskeletal: No significant bony findings. IMPRESSION: 1. Focus of acute diverticulitis involving the upper sigmoid colon with marked wall thickening and pericolonic inflammatory changes. No free air or abscess. 2. Cholelithiasis but no findings for acute cholecystitis. 3.  Enlarged prostate gland with median lobe hypertrophy impressing on the base of the bladder. 4. Stable mild irregular bladder wall thickening. Suspect small cellules and trabeculation. No bladder mass or calculi. Moderate bladder distension. 5. Aortic atherosclerosis. Aortic Atherosclerosis (ICD10-I70.0). Electronically Signed   By: Rudie Meyer M.D.   On: 01/08/2023 13:05    Assessment/Plan 1. Essential hypertension Blood pressure was elevated in the office His nurse him manages by using extra Toprol. He takes 50 mg twice daily and she adds an extra tablet if the blood pressure is elevated.  She is going to give him extra when he he gets back He also has orthostatic hypotension and she uses midodrine as needed 2. Chronic pain syndrome Now is followed by Dr. Vear Clock On chronic Norco and Fioricet  3. Type 2 diabetes mellitus with other circulatory complication, without long-term current use of insulin (HCC) A1c was elevated.  It has not been followed since then.  Reminded him again to get that added to his blood work with Oncology It is diet controlled right now  4. On prednisone therapy   5. Other osteoarthritis involving multiple joints Follows with Dr. Lennette Bihari On Chronic Norco   6. Osteopenia, unspecified location DEXA 10/24  7. MGUS (monoclonal gammopathy of unknown significance) Follows with Dr Truett Perna Stable  8. Coronary artery disease of native artery of native heart with stable angina pectoris (HCC) Follows closely with Cardiology  9. Hyperlipidemia, unspecified hyperlipidemia type Statin Need Follow up of Lipids   10. Benign prostatic hyperplasia with lower urinary tract symptoms, symptom details unspecified Cardura  11. Drug-induced constipation On MiraLAX and Linzess and Movantik.  Nurses helping him regulate and use it as needed  12. Persistent atrial fibrillation (HCC) Eliquis and Digoxin Also on Toprol  13. Diverticulitis sigmoid colon recurrent Finishing  p.o. antibiotics Followed closely by GI Planning to see surgeon 14 Ativan for anxiety per [facility]   Labs/tests ordered:  * No order type specified * Next appt:  Visit date not found

## 2023-02-02 ENCOUNTER — Other Ambulatory Visit (HOSPITAL_BASED_OUTPATIENT_CLINIC_OR_DEPARTMENT_OTHER): Payer: Self-pay

## 2023-02-07 ENCOUNTER — Encounter: Payer: Self-pay | Admitting: Internal Medicine

## 2023-02-07 ENCOUNTER — Ambulatory Visit: Payer: Medicare Other | Attending: Internal Medicine | Admitting: Internal Medicine

## 2023-02-07 VITALS — BP 102/60 | HR 72 | Ht 72.0 in | Wt 206.0 lb

## 2023-02-07 DIAGNOSIS — I4819 Other persistent atrial fibrillation: Secondary | ICD-10-CM | POA: Insufficient documentation

## 2023-02-07 NOTE — Progress Notes (Signed)
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 He is a pleasant 88 yo man with persistent atrial fib/flutter, sinus node dysfunction, s/p PPM insertion, AS s/p AVR, HTN, and CAD. He has also had atrial flutter with a RVR coming from the [place]. He most recently has had atrial fib with a fairly slow VR and ventricular pacing. We had adjusted his beta blocker down and the amount of pacing is reduced. He notes that he gets tired easily. He denies worsening chest pain particularly when he eats as well as abdominal discomfort.He admits to being more sedentary. He had been in the hospital with diverticulitis. His abdominal pain is much better.   Allergies  Allergen Reactions   Contrast Media [Iodinated Contrast Media] Hives   Gadolinium Derivatives Hives   Iodine-131 Hives   Metrizamide Hives   Tetanus Toxoids     Redness      Current Outpatient Medications  Medication Sig Dispense Refill   amLODipine (NORVASC) 2.5 MG tablet Take 0.5 tablets (1.25 mg total) by mouth daily. (Patient taking differently: Take 2.5 mg by mouth daily.) 45 tablet 3   apixaban (ELIQUIS) 5 MG TABS tablet Take 1 tablet (5 mg total) by mouth 2 (two) times daily. 180 tablet 1   butalbital-acetaminophen-caffeine (BAC) 50-325-40 MG tablet Take 1 tablet by mouth 3 (three) times daily as needed. 90 tablet 2   cholecalciferol (VITAMIN D3) 25 MCG (1000 UNIT) tablet Take 1,000 Units by mouth daily.     cilostazol (PLETAL) 100 MG tablet Take 1 tablet (100 mg total) by mouth 2 (two) times daily. 180 tablet 3   Coenzyme Q10 200 MG TABS Take 200 mg by mouth daily.     desoximetasone (TOPICORT) 0.25 % cream Apply 1 Application topically 2 (two) times daily. 60 g 1   diclofenac Sodium (VOLTAREN) 1 % GEL Apply 2 g topically 2 (two) times daily as needed (knee pain).     digoxin (LANOXIN) 0.125 MG tablet Take 1 tablet (0.125 mg total) by mouth daily. 90 tablet 1   doxazosin (CARDURA) 4 MG tablet Take 1 tablet (4 mg total) by mouth daily.  (Patient taking differently: Take 4 mg by mouth at bedtime.) 90 tablet 3   ezetimibe (ZETIA) 10 MG tablet Take 1 tablet (10 mg total) by mouth daily. (Patient taking differently: Take 10 mg by mouth at bedtime.) 90 tablet 3   furosemide (LASIX) 20 MG tablet Take 20 mg by mouth daily as needed for fluid.     HYDROcodone-acetaminophen (NORCO) 10-325 MG tablet Take 1 tablet by mouth three times a day as directed DX G89.4 90 tablet 0   linaclotide (LINZESS) 145 MCG CAPS capsule Take 145 mcg by mouth daily before breakfast.     LORazepam (ATIVAN) 1 MG tablet Take 1 tablet (1 mg) by mouth at bedtime, and 0.5 tablet (0.5 mg) daily as needed for anxiety. 60 tablet 5   magnesium oxide (MAG-OX) 400 MG tablet Take 1 tablet (400 mg total) by mouth daily. 120 tablet 1   metoprolol succinate (TOPROL XL) 25 MG 24 hr tablet Take 3 tablets (75 mg total) by mouth 2 (two) times daily. (Patient taking differently: Take 50 mg by mouth 2 (two) times daily.) 540 tablet 3   midodrine (PROAMATINE) 2.5 MG tablet Take 1 tablet (2.5 mg total) by mouth as needed.     Multiple Vitamins-Minerals (MULTIVITAMIN WITH MINERALS) tablet Take 1 tablet by mouth daily.     naloxegol oxalate (MOVANTIK) 25 MG TABS  tablet Take 1 tablet (25 mg total) by mouth daily. Take 12.5 for 2 weeks if tolerated can increase to 25 mg daily. (Patient taking differently: Take 25 mg by mouth at bedtime.) 90 tablet 3   nitroGLYCERIN (NITROSTAT) 0.4 MG SL tablet Place 1 tablet (0.4 mg total) under the tongue every 5 (five) minutes as needed for chest pain. 25 tablet 5   Omeprazole-Sodium Bicarbonate (ZEGERID PO) Take 1 capsule by mouth daily as needed (acid reflux).     pantoprazole (PROTONIX) 40 MG tablet Take 1 tablet (40 mg total) by mouth 2 (two) times daily. (Patient taking differently: Take 40 mg by mouth daily.) 180 tablet 3   Polyethyl Glycol-Propyl Glycol (SYSTANE OP) Place 1 drop into both eyes daily as needed (dry eyes).     polyethylene glycol  (MIRALAX / GLYCOLAX) 17 g packet Take 17 g by mouth daily.     potassium chloride SA (KLOR-CON M) 20 MEQ tablet Take 1 tablet (20 mEq total) by mouth daily. 90 tablet 3   predniSONE (DELTASONE) 5 MG tablet Take 5 mg by mouth daily with breakfast.     rosuvastatin (CRESTOR) 20 MG tablet Take 1 tablet (20 mg total) by mouth at bedtime. Please keep scheduled appointment for future refills. 90 tablet 3   sennosides-docusate sodium (SENOKOT-S) 8.6-50 MG tablet Take 1 tablet by mouth 2 (two) times daily.     No current facility-administered medications for this visit.     Past Medical History:  Diagnosis Date   Aortic stenosis    moderate aortic stenosis   Arthritis    Benign prostatic hypertrophy    Chronic systolic heart failure (HCC)    Chronotropic incompetence with sinus node dysfunction    Status post Guidant dual-mode, dual-pacing, dual-sensing  pacemaker   implantation now programmed to AAI with recent generator change.   Coronary artery disease    status post multiple prior percutaneous coronary interventions, microvascular angina per Dr [name]   Diverticulitis sigmoid colon recurrent    Dysfunctional autonomic nervous system    Dyspnea    Heart murmur    History of primary hypertension    Hypercoagulable state (HCC)    chronically anticoagulated with coumadin   Hyperlipidemia    Hyperthyroidism    Hypothyroidism    Dr. [name]   MGUS (monoclonal gammopathy of unknown significance) 02/17/2013   Ocular myasthenia (HCC)    Osteoarthritis    Paroxysmal atrial fibrillation (HCC)    DR. Riley Kill,    Prediabetes 09/21/2017   Stroke (HCC)    1990    ROS:   All systems reviewed and negative except as noted in the HPI.   Past Surgical History:  Procedure Laterality Date   AORTIC VALVE REPLACEMENT  03/15/2011   Procedure: AORTIC VALVE REPLACEMENT (AVR);  Surgeon: Alleen Borne, MD;  Location[facility];  Service: Open Heart Surgery;  Laterality: N/A;   APPENDECTOMY      CARDIAC CATHETERIZATION     13   CARDIOVERSION     CARDIOVERSION  04/15/2011   Procedure: CARDIOVERSION;  Surgeon: Marca Ancona, MD;  Location: [facility] ENDOSCOPY;  Service: Cardiovascular;  Laterality: N/A;   CARDIOVERSION N/A 09/11/2014   Procedure: CARDIOVERSION;  Surgeon: Quintella Reichert, MD;  Location: [facility] ENDOSCOPY;  Service: Cardiovascular;  Laterality: N/A;   CARDIOVERSION N/A 06/27/2015   Procedure: CARDIOVERSION;  Surgeon: Vesta Mixer, MD;  Location: [facility] ENDOSCOPY;  Service: Cardiovascular;  Laterality: N/A;   CARDIOVERSION N/A 07/04/2015   Procedure: CARDIOVERSION;  Surgeon: Sharlot Gowda  Myna Hidalgo, MD;  Location: [facility] ENDOSCOPY;  Service: Cardiovascular;  Laterality: N/A;   CARDIOVERSION N/A 04/13/2017   Procedure: CARDIOVERSION;  Surgeon: Quintella Reichert, MD;  Location: [facility] ENDOSCOPY;  Service: Cardiovascular;  Laterality: N/A;   COLONOSCOPY     CORONARY STENT INTERVENTION N/A 10/15/2019   Procedure: CORONARY STENT INTERVENTION;  Surgeon: Corky Crafts, MD;  Location: [facility] INVASIVE CV LAB;  Service: Cardiovascular;  Laterality: N/A;   EP IMPLANTABLE DEVICE N/A 06/16/2015   Procedure: Pacemaker Implant;  Surgeon: Marinus Maw, MD;  Location: [facility] INVASIVE CV LAB;  Service: Cardiovascular;  Laterality: N/A;   hemrrhoidectomy     LEFT AND RIGHT HEART CATHETERIZATION WITH CORONARY ANGIOGRAM Bilateral 02/01/2011   Procedure: LEFT AND RIGHT HEART CATHETERIZATION WITH CORONARY ANGIOGRAM;  Surgeon: Herby Abraham, MD;  Location: [facility] CATH LAB;  Service: Cardiovascular;  Laterality: Bilateral;   LEFT HEART CATH AND CORONARY ANGIOGRAPHY N/A 10/15/2019   Procedure: LEFT HEART CATH AND CORONARY ANGIOGRAPHY;  Surgeon: Corky Crafts, MD;  Location: [facility] INVASIVE CV LAB;  Service: Cardiovascular;  Laterality: N/A;   LEFT HEART CATH AND CORONARY ANGIOGRAPHY N/A 10/07/2020   Procedure: LEFT HEART CATH AND CORONARY ANGIOGRAPHY;  Surgeon: Tonny Bollman, MD;  Location: [facility] INVASIVE CV LAB;  Service:  Cardiovascular;  Laterality: N/A;   LEFT HEART CATH AND CORONARY ANGIOGRAPHY N/A 07/06/2022   Procedure: LEFT HEART CATH AND CORONARY ANGIOGRAPHY;  Surgeon: Tonny Bollman, MD;  Location: [facility] INVASIVE CV LAB;  Service: Cardiovascular;  Laterality: N/A;   LUMBAR LAMINECTOMY/DECOMPRESSION MICRODISCECTOMY Right 02/23/2021   Procedure: Right Lumbar four-five microdiscectomy;  Surgeon: Tia Alert, MD;  Location: [facility] OR;  Service: Neurosurgery;  Laterality: Right;   MAZE  03/15/2011   Procedure: MAZE;  Surgeon: Alleen Borne, MD;  Location: [facility] OR;  Service: Open Heart Surgery;  Laterality: N/A;   MOHS SURGERY  09/2021   PACEMAKER INSERTION  1991   Guidant PPM, most recent Generator Change by Dr [name] was 08/22/06   RIGHT/LEFT HEART CATH AND CORONARY ANGIOGRAPHY N/A 07/06/2016   Procedure: Right/Left Heart Cath and Coronary Angiography;  Surgeon: Tonny Bollman, MD;  Location: [facility] INVASIVE CV LAB;  Service: Cardiovascular;  Laterality: N/A;   TEE WITHOUT CARDIOVERSION  04/15/2011   Procedure: TRANSESOPHAGEAL ECHOCARDIOGRAM (TEE);  Surgeon: Marca Ancona, MD;  Location: [facility] ENDOSCOPY;  Service: Cardiovascular;  Laterality: N/A;   TEE WITHOUT CARDIOVERSION N/A 09/11/2014   Procedure: TRANSESOPHAGEAL ECHOCARDIOGRAM (TEE);  Surgeon: Quintella Reichert, MD;  Location: [facility] ENDOSCOPY;  Service: Cardiovascular;  Laterality: N/A;     Family History  Problem Relation Age of Onset   Heart disease Brother        Twin brother has coronary disease and recent AVR for AS   CAD Brother    Atrial fibrillation Brother    Sarcoidosis Brother    Depression Daughter    Anorexia nervosa Daughter    Hypertension Son    Anesthesia problems Neg Hx    Hypotension Neg Hx    Malignant hyperthermia Neg Hx    Pseudochol deficiency Neg Hx      Social History   Socioeconomic History   Marital status: Married    Spouse name: Dondra Spry    Number of children: 3   Years of education: Not on file   Highest education level: Not  on file  Occupational History   Occupation: Retired    Comment: Physician  Tobacco Use   Smoking status: Never   Smokeless tobacco: Never  Vaping Use  Vaping status: Never Used  Substance and Sexual Activity   Alcohol use: No    Alcohol/week: 0.0 standard drinks of alcohol   Drug use: No   Sexual activity: Not Currently  Other Topics Concern   Not on file  Social History Narrative   Married to [place]. Has grown children   Retired Proofreader MD      Never smoker no alcohol      Social Determinants of Corporate investment banker Strain: Not on file  Food Insecurity: No Food Insecurity (01/08/2023)   Hunger Vital Sign    Worried About Running Out of Food in the Last Year: Never true    Ran Out of Food in the Last Year: Never true  Transportation Needs: No Transportation Needs (01/08/2023)   PRAPARE - Administrator, Civil Service (Medical): No    Lack of Transportation (Non-Medical): No  Physical Activity: Not on file  Stress: Not on file  Social Connections: Not on file  Intimate Partner Violence: Not At Risk (01/08/2023)   Humiliation, Afraid, Rape, and Kick questionnaire    Fear of Current or Ex-Partner: No    Emotionally Abused: No    Physically Abused: No    Sexually Abused: No     BP 102/60   Pulse 72   Ht 6' (1.829 m)   Wt 206 lb (93.4 kg)   SpO2 97%   BMI 27.94 kg/m   Physical Exam:  Well appearing NAD HEENT: Unremarkable Neck:  No JVD, no thyromegally Lymphatics:  No adenopathy Back:  No CVA tenderness Lungs:  Clear with no wheezes HEART:  Regular rate rhythm, no murmurs, no rubs, no clicks Abd:  soft, positive bowel sounds, no organomegally, no rebound, no guarding Ext:  2 plus pulses, no edema, no cyanosis, no clubbing Skin:  No rashes no nodules Neuro:  CN II through XII intact, motor grossly intact  DEVICE  Normal device function.  See PaceArt for details.   Assess/Plan: Atrial fib/flutter - his rates are controlled. I have  reviewed his histograms.  PPM -his [employer] [place] [employer] DDD PM is working normally and we have left him programmed DDIR. Chronic diastolic heart failure - he has class 2-3 symptoms. Continue current meds. CAD - he appears to have had more angina. I will defer any workup to Dr. Excell Seltzer and his team.  Dorathy Daft

## 2023-02-07 NOTE — Patient Instructions (Addendum)

## 2023-02-09 ENCOUNTER — Other Ambulatory Visit: Payer: Self-pay | Admitting: *Deleted

## 2023-02-09 ENCOUNTER — Inpatient Hospital Stay: Payer: Medicare Other | Attending: Oncology

## 2023-02-09 DIAGNOSIS — K5792 Diverticulitis of intestine, part unspecified, without perforation or abscess without bleeding: Secondary | ICD-10-CM | POA: Insufficient documentation

## 2023-02-09 DIAGNOSIS — D649 Anemia, unspecified: Secondary | ICD-10-CM

## 2023-02-09 DIAGNOSIS — I2510 Atherosclerotic heart disease of native coronary artery without angina pectoris: Secondary | ICD-10-CM | POA: Insufficient documentation

## 2023-02-09 DIAGNOSIS — Z952 Presence of prosthetic heart valve: Secondary | ICD-10-CM | POA: Insufficient documentation

## 2023-02-09 DIAGNOSIS — Z950 Presence of cardiac pacemaker: Secondary | ICD-10-CM | POA: Insufficient documentation

## 2023-02-09 DIAGNOSIS — E785 Hyperlipidemia, unspecified: Secondary | ICD-10-CM | POA: Diagnosis not present

## 2023-02-09 DIAGNOSIS — N400 Enlarged prostate without lower urinary tract symptoms: Secondary | ICD-10-CM | POA: Insufficient documentation

## 2023-02-09 DIAGNOSIS — Z8673 Personal history of transient ischemic attack (TIA), and cerebral infarction without residual deficits: Secondary | ICD-10-CM | POA: Diagnosis not present

## 2023-02-09 DIAGNOSIS — I25118 Atherosclerotic heart disease of native coronary artery with other forms of angina pectoris: Secondary | ICD-10-CM | POA: Diagnosis not present

## 2023-02-09 DIAGNOSIS — E1159 Type 2 diabetes mellitus with other circulatory complications: Secondary | ICD-10-CM | POA: Diagnosis not present

## 2023-02-09 DIAGNOSIS — I4891 Unspecified atrial fibrillation: Secondary | ICD-10-CM | POA: Diagnosis not present

## 2023-02-09 DIAGNOSIS — D472 Monoclonal gammopathy: Secondary | ICD-10-CM

## 2023-02-09 DIAGNOSIS — D805 Immunodeficiency with increased immunoglobulin M [IgM]: Secondary | ICD-10-CM | POA: Diagnosis not present

## 2023-02-09 LAB — CBC WITH DIFFERENTIAL (CANCER CENTER ONLY)
Abs Immature Granulocytes: 0.02 10*3/uL (ref 0.00–0.07)
Basophils Absolute: 0 10*3/uL (ref 0.0–0.1)
Basophils Relative: 0 %
Eosinophils Absolute: 0.5 10*3/uL (ref 0.0–0.5)
Eosinophils Relative: 6 %
HCT: 34.4 % — ABNORMAL LOW (ref 39.0–52.0)
Hemoglobin: 11.1 g/dL — ABNORMAL LOW (ref 13.0–17.0)
Immature Granulocytes: 0 %
Lymphocytes Relative: 15 %
Lymphs Abs: 1.2 10*3/uL (ref 0.7–4.0)
MCH: 28.6 pg (ref 26.0–34.0)
MCHC: 32.3 g/dL (ref 30.0–36.0)
MCV: 88.7 fL (ref 80.0–100.0)
Monocytes Absolute: 0.5 10*3/uL (ref 0.1–1.0)
Monocytes Relative: 7 %
Neutro Abs: 5.8 10*3/uL (ref 1.7–7.7)
Neutrophils Relative %: 72 %
Platelet Count: 212 10*3/uL (ref 150–400)
RBC: 3.88 MIL/uL — ABNORMAL LOW (ref 4.22–5.81)
RDW: 14.8 % (ref 11.5–15.5)
WBC Count: 8 10*3/uL (ref 4.0–10.5)
nRBC: 0 % (ref 0.0–0.2)

## 2023-02-09 LAB — VITAMIN B12: Vitamin B-12: 478 pg/mL (ref 180–914)

## 2023-02-09 LAB — CMP (CANCER CENTER ONLY)
ALT: 16 U/L (ref 0–44)
AST: 20 U/L (ref 15–41)
Albumin: 4.2 g/dL (ref 3.5–5.0)
Alkaline Phosphatase: 116 U/L (ref 38–126)
Anion gap: 7 (ref 5–15)
BUN: 12 mg/dL (ref 8–23)
CO2: 32 mmol/L (ref 22–32)
Calcium: 9.6 mg/dL (ref 8.9–10.3)
Chloride: 101 mmol/L (ref 98–111)
Creatinine: 0.88 mg/dL (ref 0.61–1.24)
GFR, Estimated: >60 mL/min (ref >60)
Glucose, Bld: 136 mg/dL — ABNORMAL HIGH (ref 70–99)
Potassium: 4 mmol/L (ref 3.5–5.1)
Sodium: 140 mmol/L (ref 135–145)
Total Bilirubin: 0.6 mg/dL (ref <1.2)
Total Protein: 7.7 g/dL (ref 6.5–8.1)

## 2023-02-09 LAB — SEDIMENTATION RATE: Sed Rate: 70 mm/h — ABNORMAL HIGH (ref 0–16)

## 2023-02-09 NOTE — Progress Notes (Signed)
Patient here for his lab work. Several added for his PCP request and patient requesting sedimentation rate. Added per his request.

## 2023-02-10 DIAGNOSIS — C44321 Squamous cell carcinoma of skin of nose: Secondary | ICD-10-CM | POA: Diagnosis not present

## 2023-02-10 DIAGNOSIS — C44229 Squamous cell carcinoma of skin of left ear and external auricular canal: Secondary | ICD-10-CM | POA: Diagnosis not present

## 2023-02-11 LAB — IGM: IgM (Immunoglobulin M), Srm: 1256 mg/dL — ABNORMAL HIGH (ref 15–143)

## 2023-02-12 LAB — CUP PACEART INCLINIC DEVICE CHECK
Date Time Interrogation Session: 20241209000000
Implantable Lead Connection Status: 753985
Implantable Lead Connection Status: 753985
Implantable Lead Implant Date: 20170417
Implantable Lead Implant Date: 20170417
Implantable Lead Location: 753859
Implantable Lead Location: 753860
Implantable Lead Model: 7740
Implantable Lead Model: 7741
Implantable Lead Serial Number: 662696
Implantable Lead Serial Number: 751382
Implantable Pulse Generator Implant Date: 20170417
Lead Channel Impedance Value: 687 Ohm
Lead Channel Impedance Value: 745 Ohm
Lead Channel Pacing Threshold Amplitude: 0.8 V
Lead Channel Pacing Threshold Amplitude: 0.9 V
Lead Channel Pacing Threshold Pulse Width: 0.4 ms
Lead Channel Pacing Threshold Pulse Width: 0.5 ms
Lead Channel Sensing Intrinsic Amplitude: 0.7 mV
Lead Channel Sensing Intrinsic Amplitude: 13.9 mV
Lead Channel Setting Pacing Amplitude: 2.5 V
Lead Channel Setting Pacing Amplitude: 2.5 V
Lead Channel Setting Pacing Pulse Width: 0.4 ms
Lead Channel Setting Sensing Sensitivity: 2.5 mV
Pulse Gen Serial Number: 718418
Zone Setting Status: 755011

## 2023-02-14 ENCOUNTER — Other Ambulatory Visit: Payer: Self-pay

## 2023-02-14 ENCOUNTER — Inpatient Hospital Stay (HOSPITAL_BASED_OUTPATIENT_CLINIC_OR_DEPARTMENT_OTHER): Payer: Medicare Other | Admitting: Oncology

## 2023-02-14 ENCOUNTER — Other Ambulatory Visit (HOSPITAL_BASED_OUTPATIENT_CLINIC_OR_DEPARTMENT_OTHER): Payer: Self-pay

## 2023-02-14 ENCOUNTER — Other Ambulatory Visit: Payer: Medicare Other

## 2023-02-14 ENCOUNTER — Telehealth: Payer: Self-pay | Admitting: Gastroenterology

## 2023-02-14 VITALS — BP 124/76 | HR 69 | Temp 98.1°F | Resp 18 | Ht 72.0 in | Wt 206.4 lb

## 2023-02-14 DIAGNOSIS — D472 Monoclonal gammopathy: Secondary | ICD-10-CM | POA: Diagnosis not present

## 2023-02-14 DIAGNOSIS — N400 Enlarged prostate without lower urinary tract symptoms: Secondary | ICD-10-CM | POA: Diagnosis not present

## 2023-02-14 DIAGNOSIS — I2510 Atherosclerotic heart disease of native coronary artery without angina pectoris: Secondary | ICD-10-CM | POA: Diagnosis not present

## 2023-02-14 DIAGNOSIS — I4891 Unspecified atrial fibrillation: Secondary | ICD-10-CM | POA: Diagnosis not present

## 2023-02-14 DIAGNOSIS — D649 Anemia, unspecified: Secondary | ICD-10-CM | POA: Diagnosis not present

## 2023-02-14 DIAGNOSIS — K5792 Diverticulitis of intestine, part unspecified, without perforation or abscess without bleeding: Secondary | ICD-10-CM | POA: Diagnosis not present

## 2023-02-14 DIAGNOSIS — D805 Immunodeficiency with increased immunoglobulin M [IgM]: Secondary | ICD-10-CM | POA: Diagnosis not present

## 2023-02-14 LAB — PROTEIN ELECTROPHORESIS, SERUM
A/G Ratio: 1.1 (ref 0.7–1.7)
Albumin ELP: 3.8 g/dL (ref 2.9–4.4)
Alpha-1-Globulin: 0.3 g/dL (ref 0.0–0.4)
Alpha-2-Globulin: 0.8 g/dL (ref 0.4–1.0)
Beta Globulin: 1.8 g/dL — ABNORMAL HIGH (ref 0.7–1.3)
Gamma Globulin: 0.7 g/dL (ref 0.4–1.8)
Globulin, Total: 3.5 g/dL (ref 2.2–3.9)
M-Spike, %: 0.8 g/dL — ABNORMAL HIGH
Total Protein ELP: 7.3 g/dL (ref 6.0–8.5)

## 2023-02-14 NOTE — Progress Notes (Signed)
  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  He completed a course of antibiotics.  He reports his symptoms have improved.  He has chronic pain.  He is followed by Dr. Vear Clock in the pain clinic.  No fever or night sweats.  No palpable lymph nodes.  Objective:  Vital signs in last 24 hours:  Blood pressure 124/76, pulse 69, temperature 98.1 F (36.7 C), temperature source Temporal, resp. rate 18, height 6' (1.829 m), weight 206 lb 6.4 oz (93.6 kg), SpO2 98%.   Lymphatics: No cervical, supraclavicular, axillary, or inguinal nodes Resp: Lungs clear bilaterally Cardio: Irregular GI: No hepatosplenomegaly, nontender Vascular: No leg edema  Lab Results:  Lab Results  Component Value Date   WBC 8.0 02/09/2023   HGB 11.1 (L) 02/09/2023   HCT 34.4 (L) 02/09/2023   MCV 88.7 02/09/2023   PLT 212 02/09/2023   NEUTROABS 5.8 02/09/2023    CMP  Lab Results  Component Value Date   NA 140 02/09/2023   K 4.0 02/09/2023   CL 101 02/09/2023   CO2 32 02/09/2023   GLUCOSE 136 (H) 02/09/2023   BUN 12 02/09/2023   CREATININE 0.88 02/09/2023   CALCIUM 9.6 02/09/2023   PROT 7.7 02/09/2023   ALBUMIN 4.2 02/09/2023   AST 20 02/09/2023   ALT 16 02/09/2023   ALKPHOS 116 02/09/2023   BILITOT 0.6 02/09/2023   GFRNONAA >60 02/09/2023   GFRAA 92 11/26/2019    Medications: I have reviewed the patient's current medications.   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  This  likely represents a monoclonal gammopathy of unknown significance versus early lymphoplasmacytic lymphoma.  He has no clinical symptoms to suggest a progressive lymphoproliferative disorder.  He has chronic mild anemia, likely secondary to the anemia of disease.  Anemia could also be related to the IgM protein.  He will return for an office and lab visit in 8 months.  I will see him in the interim for new symptoms.  He plans to obtain a pneumococcal vaccine.  [name], MD  02/14/2023  11:44 AM

## 2023-02-14 NOTE — Telephone Encounter (Signed)
Tell him to come to see her Wednesday at 3:30 pm. Ask what we need to put as the reason for the appointment.

## 2023-02-14 NOTE — Telephone Encounter (Signed)
PT is calling to get an appointment this week with Dr. Lavon Paganini. I advised she has no appointments until March. Requesting to speak to a nurse to schedule him immediately

## 2023-02-15 ENCOUNTER — Other Ambulatory Visit (HOSPITAL_BASED_OUTPATIENT_CLINIC_OR_DEPARTMENT_OTHER): Payer: Self-pay

## 2023-02-15 MED ORDER — HYDROCODONE-ACETAMINOPHEN 10-325 MG PO TABS
1.0000 | ORAL_TABLET | Freq: Three times a day (TID) | ORAL | 0 refills | Status: DC
  Filled 2023-02-15 – 2023-02-18 (×2): qty 90, 30d supply, fill #0

## 2023-02-15 NOTE — Telephone Encounter (Signed)
PT confirmed appointment and he needs to be seen for lower abdominal pain.

## 2023-02-16 ENCOUNTER — Encounter: Payer: Self-pay | Admitting: Gastroenterology

## 2023-02-16 ENCOUNTER — Ambulatory Visit: Payer: Medicare Other | Admitting: Gastroenterology

## 2023-02-16 ENCOUNTER — Other Ambulatory Visit (HOSPITAL_BASED_OUTPATIENT_CLINIC_OR_DEPARTMENT_OTHER): Payer: Self-pay

## 2023-02-16 VITALS — BP 140/80 | HR 84

## 2023-02-16 DIAGNOSIS — R634 Abnormal weight loss: Secondary | ICD-10-CM

## 2023-02-16 DIAGNOSIS — K5904 Chronic idiopathic constipation: Secondary | ICD-10-CM

## 2023-02-16 DIAGNOSIS — K219 Gastro-esophageal reflux disease without esophagitis: Secondary | ICD-10-CM | POA: Diagnosis not present

## 2023-02-16 DIAGNOSIS — K5909 Other constipation: Secondary | ICD-10-CM

## 2023-02-16 DIAGNOSIS — Z8719 Personal history of other diseases of the digestive system: Secondary | ICD-10-CM | POA: Diagnosis not present

## 2023-02-16 DIAGNOSIS — R197 Diarrhea, unspecified: Secondary | ICD-10-CM

## 2023-02-16 DIAGNOSIS — I10 Essential (primary) hypertension: Secondary | ICD-10-CM

## 2023-02-16 DIAGNOSIS — K581 Irritable bowel syndrome with constipation: Secondary | ICD-10-CM

## 2023-02-16 MED ORDER — LINACLOTIDE 145 MCG PO CAPS
145.0000 ug | ORAL_CAPSULE | Freq: Every day | ORAL | 3 refills | Status: DC
  Filled 2023-02-16: qty 90, 90d supply, fill #0
  Filled 2023-05-08: qty 90, 90d supply, fill #1
  Filled 2023-07-28: qty 90, 90d supply, fill #2
  Filled 2023-11-10: qty 90, 90d supply, fill #3

## 2023-02-16 MED ORDER — NALOXEGOL OXALATE 25 MG PO TABS
25.0000 mg | ORAL_TABLET | Freq: Every day | ORAL | 3 refills | Status: DC
  Filled 2023-02-16 – 2023-02-24 (×7): qty 90, 90d supply, fill #0
  Filled 2023-05-08: qty 90, 90d supply, fill #1

## 2023-02-16 NOTE — Patient Instructions (Addendum)
VISIT SUMMARY:  During today's visit, we discussed your bowel movements, weight loss, and overall health management. You reported daily bowel movements with occasional diarrhea and some weight loss. Your blood pressure has been stable, and your GERD symptoms have improved. We reviewed your current medications and made some adjustments to better manage your symptoms. You are feeling well overall and are committed to maintaining your health.  YOUR PLAN:  -CHRONIC CONSTIPATION: Chronic constipation means having infrequent or difficult bowel movements. You will continue taking MiraLAX, Linzess, Movantik, and Senna, with some adjustments: MiraLAX half capful daily in the evening, Linzesss 145 in the morning, Movantik 25 in the evening, and Senna as needed. If constipation persists, add half capful of MiraLAX in the morning and consider adding Senna.  -GASTROESOPHAGEAL REFLUX DISEASE (GERD): GERD is a condition where stomach acid frequently flows back into the tube connecting your mouth and stomach. You will continue taking Protonix once daily and Zegerid as needed.  -HYPERTENSION: Hypertension is high blood pressure. Your blood pressure is well-controlled with Amlodipine, so you will continue taking it as prescribed.  -WEIGHT LOSS: You have been experiencing weight loss, and the cause is unclear. We will monitor your weight closely.  -DIVERTICULITIS: Diverticulitis is an inflammation or infection of small pouches that can form in your intestines. You have a consultation scheduled with Dr. Drue Dun in February, and we will follow up in March after your consultation.  -GENERAL HEALTH MAINTENANCE: For your general health, consider getting the Prevnar 20 vaccination at the hospital pharmacy at least three weeks after your recent hospitalization. Continue monitoring your MGUS, which is currently stable. No further scans are needed unless you experience pain again.  INSTRUCTIONS:  Follow up in March after  your consultation with Dr. Drue Dun. Monitor your weight and report any significant changes. Consider getting the Prevnar 20 vaccination at the hospital pharmacy at least three weeks after your recent hospitalization.  I appreciate the  opportunity to care for you  Thank You   Marsa Aris , MD

## 2023-02-16 NOTE — Progress Notes (Unsigned)
Kerry Kass, MD    [id number]    March 03, 1934  Primary Care Physician:Gupta, Freddie Breech, MD  Referring Physician: Mahlon Gammon, MD [address] [place],  [place] [address]   Chief complaint: Constipation, history of diverticulitis  Discussed the use of AI scribe software for clinical note transcription with the patient, who gave verbal consent to proceed.  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 He reports having daily bowel movements, with diarrhea every other day. The patient is currently on a regimen of MiraLAX, which he adjusts based on the consistency of his stools. He also reports some weight loss, which he finds surprising given improvement of abdominal pain and appetite.  The patient has a history of multiple cardiovascular conditions, including CHF, and is on a regimen of amlodipine. He reports that his blood pressure has been stable and within acceptable ranges.  The patient also has a history of gastroesophageal reflux disease (GERD) and is currently on Protonix once daily. He reports a significant reduction in burping, which he previously experienced frequently.  The patient has a history of constipation and is on a regimen of Movantik, Senna, and MiraLAX. He reports that his bowel movements have improved and he no longer feels "backed up."  The patient also has a history of monoclonal gammopathy of undetermined significance (MGUS), which has remained stable. He is scheduled to see a specialist for a consultation in the near future.  Despite his multiple health conditions, the patient reports feeling well overall and is actively engaged in managing his health. He expresses a positive outlook and a commitment to maintaining his health.       Clinical history includes MGUS, A-fib on Eliquis, s/p AVR, CAD, CHF, history of renal artery stenosis on  Pletal   CT Angio Abdomen Pelvis w/wo contrast 12-13-22 -VASCULAR Atherosclerosis of abdominal aorta is noted without aneurysm or dissection. No significant mesenteric or renal artery stenosis is noted. Aortic Atherosclerosis (ICD10-I70.0). -NON-VASCULAR Diverticulosis of descending and sigmoid colon is noted. Minimal inflammatory changes are noted involving the proximal sigmoid colon which were present on prior exam and may represent chronic sequela of previous inflammation, although acute inflammation cannot be excluded.   Stable moderate prostatic enlargement.   CT abdomen pelvis November 05, 2022 without contrast 1. Minimal stranding in the proximal sigmoid colon compatible with acute diverticulitis. No free air or free fluid is present. 2. Cholelithiasis without evidence of cholecystitis. 3. Stable 5 cm simple cyst at the lower pole of the right kidney. No follow-up imaging is recommended. JACR 2018 Feb; 264-273, Management of the Incidental Renal Mass on CT, [employer] 2021; 814-848, Bosniak Classification of Cystic Renal Masses, Version 2019. 4. Cardiomegaly without failure. 5. Coronary artery disease. 6.  Aortic Atherosclerosis (ICD10-I70.0).   CT abdomen pelvis without contrast September 30, 2022 1. There is mild bowel wall thickening and faint adjacent fat stranding at the confluence of the sigmoid colon and descending colon. This could reflect early acute uncomplicated diverticulitis. 2. Bladder wall is circumferentially thickened with a trabeculated appearance. This could reflect sequela of chronic outlet obstruction but given faint adjacent fat stranding, recommend correlation with urinalysis to exclude superimposed infection. 3. Massive prostatomegaly.   CT Abdomen Pelvis w contrast 08-12-21  1. No acute CT findings of the abdomen or pelvis to explain lower abdominal pain. 2. Descending and sigmoid diverticulosis without  evidence of acute diverticulitis. 3.  Severe prostatomegaly. 4. Tiny gallstones and or sludge in the gallbladder. No evidence of acute cholecystitis. 5. Coronary artery disease.   CT Abdomen Pelvis wo contrast 10-18-19 -Mild to moderate acute sigmoid diverticulitis. No evidence of abscess or other complication. -Stable enlarged prostate and findings of chronic bladder outlet obstruction.  -Cholelithiasis. No radiographic evidence of cholecystitis.    Outpatient Encounter Medications as of 02/16/2023  Medication Sig   amLODipine (NORVASC) 2.5 MG tablet Take 0.5 tablets (1.25 mg total) by mouth daily.   apixaban (ELIQUIS) 5 MG TABS tablet Take 1 tablet (5 mg total) by mouth 2 (two) times daily.   butalbital-acetaminophen-caffeine (BAC) 50-325-40 MG tablet Take 1 tablet by mouth 3 (three) times daily as needed.   cholecalciferol (VITAMIN D3) 25 MCG (1000 UNIT) tablet Take 1,000 Units by mouth daily.   cilostazol (PLETAL) 100 MG tablet Take 1 tablet (100 mg total) by mouth 2 (two) times daily.   Coenzyme Q10 200 MG TABS Take 200 mg by mouth daily.   desoximetasone (TOPICORT) 0.25 % cream Apply 1 Application topically 2 (two) times daily.   diclofenac Sodium (VOLTAREN) 1 % GEL Apply 2 g topically 2 (two) times daily as needed (knee pain).   digoxin (LANOXIN) 0.125 MG tablet Take 1 tablet (0.125 mg total) by mouth daily.   doxazosin (CARDURA) 4 MG tablet Take 1 tablet (4 mg total) by mouth daily. (Patient taking differently: Take 4 mg by mouth at bedtime.)   ezetimibe (ZETIA) 10 MG tablet Take 1 tablet (10 mg total) by mouth daily. (Patient taking differently: Take 10 mg by mouth at bedtime.)   furosemide (LASIX) 20 MG tablet Take 20 mg by mouth daily as needed for fluid.   HYDROcodone-acetaminophen (NORCO) 10-325 MG tablet Take 1 tablet by mouth 3 (three) times daily as directed.   LORazepam (ATIVAN) 1 MG tablet Take 1 tablet (1 mg) by mouth at bedtime, and 0.5 tablet (0.5 mg) daily as needed for anxiety.   magnesium oxide  (MAG-OX) 400 MG tablet Take 1 tablet (400 mg total) by mouth daily.   metoprolol succinate (TOPROL XL) 25 MG 24 hr tablet Take 3 tablets (75 mg total) by mouth 2 (two) times daily. (Patient taking differently: Take 50 mg by mouth 2 (two) times daily.)   Multiple Vitamins-Minerals (MULTIVITAMIN WITH MINERALS) tablet Take 1 tablet by mouth daily.   nitroGLYCERIN (NITROSTAT) 0.4 MG SL tablet Place 1 tablet (0.4 mg total) under the tongue every 5 (five) minutes as needed for chest pain.   Omeprazole-Sodium Bicarbonate (ZEGERID PO) Take 1 capsule by mouth daily as needed (acid reflux).   pantoprazole (PROTONIX) 40 MG tablet Take 1 tablet (40 mg total) by mouth 2 (two) times daily. (Patient taking differently: Take 40 mg by mouth daily.)   Polyethyl Glycol-Propyl Glycol (SYSTANE OP) Place 1 drop into both eyes daily as needed (dry eyes).   polyethylene glycol (MIRALAX / GLYCOLAX) 17 g packet Take 17 g by mouth daily.   potassium chloride SA (KLOR-CON M) 20 MEQ tablet Take 1 tablet (20 mEq total) by mouth daily.   predniSONE (DELTASONE) 5 MG tablet Take 5 mg by mouth daily with breakfast.   rosuvastatin (CRESTOR) 20 MG tablet Take 1 tablet (20 mg total) by mouth at bedtime. Please keep scheduled appointment for future refills.   sennosides-docusate sodium (SENOKOT-S) 8.6-50 MG tablet Take 1 tablet by mouth 2 (two) times daily.   [DISCONTINUED] linaclotide (LINZESS) 145 MCG CAPS capsule Take 145 mcg  by mouth daily before breakfast.   [DISCONTINUED] naloxegol oxalate (MOVANTIK) 25 MG TABS tablet Take 1 tablet (25 mg total) by mouth daily. Take 12.5 for 2 weeks if tolerated can increase to 25 mg daily. (Patient taking differently: Take 25 mg by mouth at bedtime.)   linaclotide (LINZESS) 145 MCG CAPS capsule Take 1 capsule (145 mcg total) by mouth daily before breakfast.   naloxegol oxalate (MOVANTIK) 25 MG TABS tablet Take 1 tablet (25 mg total) by mouth at bedtime.   [DISCONTINUED] pregabalin (LYRICA) 75 MG  capsule Take 75 mg by mouth 2 (two) times daily.   No facility-administered encounter medications on file as of 02/16/2023.    Allergies as of 02/16/2023 - Review Complete 02/16/2023  Allergen Reaction Noted   Contrast media [iodinated contrast media] Hives 02/01/2011   Gadolinium derivatives Hives 10/09/2012   Iodine-131 Hives 10/07/2021   Metrizamide Hives 02/01/2011   Tetanus toxoids  10/07/2021    Past Medical History:  Diagnosis Date   Aortic stenosis    moderate aortic stenosis   Arthritis    Benign prostatic hypertrophy    Chronic systolic heart failure (HCC)    Chronotropic incompetence with sinus node dysfunction    Status post Guidant dual-mode, dual-pacing, dual-sensing  pacemaker   implantation now programmed to AAI with recent generator change.   Coronary artery disease    status post multiple prior percutaneous coronary interventions, microvascular angina per Dr [name]   Diverticulitis sigmoid colon recurrent    Dysfunctional autonomic nervous system    Dyspnea    Heart murmur    History of primary hypertension    Hypercoagulable state (HCC)    chronically anticoagulated with coumadin   Hyperlipidemia    Hyperthyroidism    Hypothyroidism    Dr. [name]   MGUS (monoclonal gammopathy of unknown significance) 02/17/2013   Ocular myasthenia (HCC)    Osteoarthritis    Paroxysmal atrial fibrillation (HCC)    DR. Riley Kill,    Prediabetes 09/21/2017   Stroke (HCC)    1990    Past Surgical History:  Procedure Laterality Date   AORTIC VALVE REPLACEMENT  03/15/2011   Procedure: AORTIC VALVE REPLACEMENT (AVR);  Surgeon: Alleen Borne, MD;  Location: [facility] OR;  Service: Open Heart Surgery;  Laterality: N/A;   APPENDECTOMY     CARDIAC CATHETERIZATION     13   CARDIOVERSION     CARDIOVERSION  04/15/2011   Procedure: CARDIOVERSION;  Surgeon: Marca Ancona, MD;  Location: [facility] ENDOSCOPY;  Service: Cardiovascular;  Laterality: N/A;   CARDIOVERSION N/A 09/11/2014    Procedure: CARDIOVERSION;  Surgeon: Quintella Reichert, MD;  Location: [facility] ENDOSCOPY;  Service: Cardiovascular;  Laterality: N/A;   CARDIOVERSION N/A 06/27/2015   Procedure: CARDIOVERSION;  Surgeon: Vesta Mixer, MD;  Location: [facility] ENDOSCOPY;  Service: Cardiovascular;  Laterality: N/A;   CARDIOVERSION N/A 07/04/2015   Procedure: CARDIOVERSION;  Surgeon: Marinus Maw, MD;  Location: [facility] ENDOSCOPY;  Service: Cardiovascular;  Laterality: N/A;   CARDIOVERSION N/A 04/13/2017   Procedure: CARDIOVERSION;  Surgeon: Quintella Reichert, MD;  Location: [facility] ENDOSCOPY;  Service: Cardiovascular;  Laterality: N/A;   COLONOSCOPY     CORONARY STENT INTERVENTION N/A 10/15/2019   Procedure: CORONARY STENT INTERVENTION;  Surgeon: Corky Crafts, MD;  Location: [facility] INVASIVE CV LAB;  Service: Cardiovascular;  Laterality: N/A;   EP IMPLANTABLE DEVICE N/A 06/16/2015   Procedure: Pacemaker Implant;  Surgeon: Marinus Maw, MD;  Location: [facility] INVASIVE CV LAB;  Service: Cardiovascular;  Laterality: N/A;  hemrrhoidectomy     LEFT AND RIGHT HEART CATHETERIZATION WITH CORONARY ANGIOGRAM Bilateral 02/01/2011   Procedure: LEFT AND RIGHT HEART CATHETERIZATION WITH CORONARY ANGIOGRAM;  Surgeon: Herby Abraham, MD;  Location: [facility] CATH LAB;  Service: Cardiovascular;  Laterality: Bilateral;   LEFT HEART CATH AND CORONARY ANGIOGRAPHY N/A 10/15/2019   Procedure: LEFT HEART CATH AND CORONARY ANGIOGRAPHY;  Surgeon: Corky Crafts, MD;  Location: [facility] INVASIVE CV LAB;  Service: Cardiovascular;  Laterality: N/A;   LEFT HEART CATH AND CORONARY ANGIOGRAPHY N/A 10/07/2020   Procedure: LEFT HEART CATH AND CORONARY ANGIOGRAPHY;  Surgeon: Tonny Bollman, MD;  Location: [facility] INVASIVE CV LAB;  Service: Cardiovascular;  Laterality: N/A;   LEFT HEART CATH AND CORONARY ANGIOGRAPHY N/A 07/06/2022   Procedure: LEFT HEART CATH AND CORONARY ANGIOGRAPHY;  Surgeon: Tonny Bollman, MD;  Location: [facility] INVASIVE CV LAB;  Service: Cardiovascular;   Laterality: N/A;   LUMBAR LAMINECTOMY/DECOMPRESSION MICRODISCECTOMY Right 02/23/2021   Procedure: Right Lumbar four-five microdiscectomy;  Surgeon: Tia Alert, MD;  Location: [facility] OR;  Service: Neurosurgery;  Laterality: Right;   MAZE  03/15/2011   Procedure: MAZE;  Surgeon: Alleen Borne, MD;  Location: [facility] OR;  Service: Open Heart Surgery;  Laterality: N/A;   MOHS SURGERY  09/2021   PACEMAKER INSERTION  1991   Guidant PPM, most recent Generator Change by Dr [name] was 08/22/06   RIGHT/LEFT HEART CATH AND CORONARY ANGIOGRAPHY N/A 07/06/2016   Procedure: Right/Left Heart Cath and Coronary Angiography;  Surgeon: Tonny Bollman, MD;  Location: [facility] INVASIVE CV LAB;  Service: Cardiovascular;  Laterality: N/A;   TEE WITHOUT CARDIOVERSION  04/15/2011   Procedure: TRANSESOPHAGEAL ECHOCARDIOGRAM (TEE);  Surgeon: Marca Ancona, MD;  Location: [facility] ENDOSCOPY;  Service: Cardiovascular;  Laterality: N/A;   TEE WITHOUT CARDIOVERSION N/A 09/11/2014   Procedure: TRANSESOPHAGEAL ECHOCARDIOGRAM (TEE);  Surgeon: Quintella Reichert, MD;  Location: [facility] ENDOSCOPY;  Service: Cardiovascular;  Laterality: N/A;    Family History  Problem Relation Age of Onset   Heart disease Brother        Twin brother has coronary disease and recent AVR for AS   CAD Brother    Atrial fibrillation Brother    Sarcoidosis Brother    Depression Daughter    Anorexia nervosa Daughter    Hypertension Son    Anesthesia problems Neg Hx    Hypotension Neg Hx    Malignant hyperthermia Neg Hx    Pseudochol deficiency Neg Hx     Social History   Socioeconomic History   Marital status: Married    Spouse name: Dondra Spry    Number of children: 3   Years of education: Not on file   Highest education level: Not on file  Occupational History   Occupation: Retired    Comment: Physician  Tobacco Use   Smoking status: Never   Smokeless tobacco: Never  Vaping Use   Vaping status: Never Used  Substance and Sexual Activity   Alcohol use: No     Alcohol/week: 0.0 standard drinks of alcohol   Drug use: No   Sexual activity: Not Currently  Other Topics Concern   Not on file  Social History Narrative   Married to [place]. Has grown children   Retired Proofreader MD      Never smoker no alcohol      Social Drivers of Corporate investment banker Strain: Not on file  Food Insecurity: No Food Insecurity (01/08/2023)   Hunger Vital Sign    Worried About Running Out of  Food in the Last Year: Never true    Ran Out of Food in the Last Year: Never true  Transportation Needs: No Transportation Needs (01/08/2023)   PRAPARE - Administrator, Civil Service (Medical): No    Lack of Transportation (Non-Medical): No  Physical Activity: Not on file  Stress: Not on file  Social Connections: Not on file  Intimate Partner Violence: Not At Risk (01/08/2023)   Humiliation, Afraid, Rape, and Kick questionnaire    Fear of Current or Ex-Partner: No    Emotionally Abused: No    Physically Abused: No    Sexually Abused: No      Review of systems: All other review of systems negative except as mentioned in the HPI.   Physical Exam: Vitals:   02/16/23 1533  BP: (!) 140/80  Pulse: 84   There is no height or weight on file to calculate BMI. 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 Currently on MiraLAX, Linzess, Movantik, and Senna. -Continue MiraLAX half capful daily in the  evening. -Continue Linzess in the morning. -Continue Movantik 25mg  in the evening. -Reduce Senna to as needed. -If constipation persists, add half capful of MiraLAX in the morning and consider adding Senna.  Gastroesophageal Reflux Disease On Protonix once daily and Zegerid as needed. -Continue Protonix once daily. -Continue Zegerid as needed.  Hypertension Blood pressure controlled on Amlodipine. -Continue Amlodipine.  Weight Loss Ongoing weight loss, cause unclear. -Monitor weight.  Diverticulitis History of multiple episodes. Consultation with Dr. Drue Dun at [facility] scheduled for February. -Follow-up in March after consultation with Dr. Drue Dun.  General Health Maintenance -Consider Prevnar 20 vaccination, to be administered at hospital pharmacy at least three weeks post-hospitalization. -Continue monitoring MGUS, currently stable. -No further scans needed unless pain recurs.      The patient was provided an opportunity to ask questions and all were answered. The patient agreed with the plan and demonstrated an understanding of the instructions.  Iona Beard , MD    CC: Mahlon Gammon, MD

## 2023-02-17 ENCOUNTER — Other Ambulatory Visit (HOSPITAL_BASED_OUTPATIENT_CLINIC_OR_DEPARTMENT_OTHER): Payer: Self-pay

## 2023-02-17 ENCOUNTER — Encounter: Payer: Self-pay | Admitting: Gastroenterology

## 2023-02-17 MED ORDER — PANTOPRAZOLE SODIUM 40 MG PO TBEC
40.0000 mg | DELAYED_RELEASE_TABLET | Freq: Every day | ORAL | 3 refills | Status: DC
  Filled 2023-02-17 – 2023-03-22 (×4): qty 90, 90d supply, fill #0
  Filled 2023-06-21: qty 90, 90d supply, fill #1
  Filled 2023-09-15: qty 90, 90d supply, fill #2

## 2023-02-18 ENCOUNTER — Other Ambulatory Visit (HOSPITAL_BASED_OUTPATIENT_CLINIC_OR_DEPARTMENT_OTHER): Payer: Self-pay

## 2023-02-19 ENCOUNTER — Other Ambulatory Visit: Payer: Self-pay | Admitting: Internal Medicine

## 2023-02-19 ENCOUNTER — Other Ambulatory Visit (HOSPITAL_BASED_OUTPATIENT_CLINIC_OR_DEPARTMENT_OTHER): Payer: Self-pay

## 2023-02-20 ENCOUNTER — Other Ambulatory Visit (HOSPITAL_BASED_OUTPATIENT_CLINIC_OR_DEPARTMENT_OTHER): Payer: Self-pay

## 2023-02-21 ENCOUNTER — Other Ambulatory Visit (HOSPITAL_BASED_OUTPATIENT_CLINIC_OR_DEPARTMENT_OTHER): Payer: Self-pay

## 2023-02-21 ENCOUNTER — Other Ambulatory Visit: Payer: Self-pay

## 2023-02-21 MED ORDER — MAGNESIUM OXIDE 400 MG PO TABS
400.0000 mg | ORAL_TABLET | Freq: Every day | ORAL | 1 refills | Status: DC
  Filled 2023-02-21 – 2023-04-13 (×2): qty 120, 120d supply, fill #0
  Filled 2023-07-20: qty 120, 120d supply, fill #1

## 2023-02-22 ENCOUNTER — Other Ambulatory Visit (HOSPITAL_BASED_OUTPATIENT_CLINIC_OR_DEPARTMENT_OTHER): Payer: Self-pay

## 2023-02-24 ENCOUNTER — Other Ambulatory Visit (HOSPITAL_BASED_OUTPATIENT_CLINIC_OR_DEPARTMENT_OTHER): Payer: Self-pay

## 2023-02-24 ENCOUNTER — Other Ambulatory Visit: Payer: Self-pay

## 2023-02-28 ENCOUNTER — Other Ambulatory Visit (HOSPITAL_BASED_OUTPATIENT_CLINIC_OR_DEPARTMENT_OTHER): Payer: Self-pay

## 2023-02-28 DIAGNOSIS — Z23 Encounter for immunization: Secondary | ICD-10-CM | POA: Diagnosis not present

## 2023-02-28 MED ORDER — PREVNAR 20 0.5 ML IM SUSY
PREFILLED_SYRINGE | INTRAMUSCULAR | 0 refills | Status: DC
  Filled 2023-02-28: qty 0.5, 1d supply, fill #0

## 2023-03-01 ENCOUNTER — Other Ambulatory Visit (HOSPITAL_BASED_OUTPATIENT_CLINIC_OR_DEPARTMENT_OTHER): Payer: Self-pay

## 2023-03-03 DIAGNOSIS — C44321 Squamous cell carcinoma of skin of nose: Secondary | ICD-10-CM | POA: Diagnosis not present

## 2023-03-09 NOTE — Telephone Encounter (Signed)
 Per Dr. Elana Alm latest OV on 12-18, patient has appointment in Feb with Dr. Drue Dun at [facility]

## 2023-03-10 DIAGNOSIS — C44229 Squamous cell carcinoma of skin of left ear and external auricular canal: Secondary | ICD-10-CM | POA: Diagnosis not present

## 2023-03-18 ENCOUNTER — Other Ambulatory Visit (HOSPITAL_BASED_OUTPATIENT_CLINIC_OR_DEPARTMENT_OTHER): Payer: Self-pay

## 2023-03-18 DIAGNOSIS — G894 Chronic pain syndrome: Secondary | ICD-10-CM | POA: Diagnosis not present

## 2023-03-18 DIAGNOSIS — M47812 Spondylosis without myelopathy or radiculopathy, cervical region: Secondary | ICD-10-CM | POA: Diagnosis not present

## 2023-03-18 DIAGNOSIS — Z79891 Long term (current) use of opiate analgesic: Secondary | ICD-10-CM | POA: Diagnosis not present

## 2023-03-18 DIAGNOSIS — M48062 Spinal stenosis, lumbar region with neurogenic claudication: Secondary | ICD-10-CM | POA: Diagnosis not present

## 2023-03-18 MED ORDER — HYDROCODONE-ACETAMINOPHEN 10-325 MG PO TABS
1.0000 | ORAL_TABLET | Freq: Three times a day (TID) | ORAL | 0 refills | Status: DC
  Filled 2023-03-18 – 2023-03-21 (×2): qty 90, 30d supply, fill #0

## 2023-03-21 ENCOUNTER — Other Ambulatory Visit (HOSPITAL_BASED_OUTPATIENT_CLINIC_OR_DEPARTMENT_OTHER): Payer: Self-pay

## 2023-03-22 ENCOUNTER — Other Ambulatory Visit: Payer: Self-pay

## 2023-03-22 ENCOUNTER — Other Ambulatory Visit (HOSPITAL_BASED_OUTPATIENT_CLINIC_OR_DEPARTMENT_OTHER): Payer: Self-pay

## 2023-03-29 ENCOUNTER — Other Ambulatory Visit: Payer: Self-pay

## 2023-03-29 ENCOUNTER — Telehealth: Payer: Self-pay

## 2023-03-29 ENCOUNTER — Other Ambulatory Visit (HOSPITAL_BASED_OUTPATIENT_CLINIC_OR_DEPARTMENT_OTHER): Payer: Self-pay

## 2023-03-29 MED ORDER — METRONIDAZOLE 250 MG PO TABS
250.0000 mg | ORAL_TABLET | Freq: Three times a day (TID) | ORAL | 0 refills | Status: DC
  Filled 2023-03-29 (×2): qty 30, 10d supply, fill #0

## 2023-03-29 MED ORDER — CIPROFLOXACIN HCL 500 MG PO TABS
500.0000 mg | ORAL_TABLET | Freq: Three times a day (TID) | ORAL | 0 refills | Status: AC
  Filled 2023-03-29: qty 15, 5d supply, fill #0

## 2023-03-29 NOTE — Telephone Encounter (Signed)
Spoke with the patient. Reports he developed his diverticulitis pain 2 days ago. He started Cipro and Flagyl. Today he feels he is improvement but not complete resolution. Per Dr Lavon Paganini patient will continue Cipro and Flagyl for a full 7 day course. Call if he acutely worsens or he fails to improve.

## 2023-03-31 DIAGNOSIS — T8131XA Disruption of external operation (surgical) wound, not elsewhere classified, initial encounter: Secondary | ICD-10-CM | POA: Diagnosis not present

## 2023-03-31 DIAGNOSIS — Z5189 Encounter for other specified aftercare: Secondary | ICD-10-CM | POA: Diagnosis not present

## 2023-04-12 DIAGNOSIS — Z8719 Personal history of other diseases of the digestive system: Secondary | ICD-10-CM | POA: Diagnosis not present

## 2023-04-12 DIAGNOSIS — K5909 Other constipation: Secondary | ICD-10-CM | POA: Diagnosis not present

## 2023-04-13 ENCOUNTER — Other Ambulatory Visit: Payer: Self-pay | Admitting: Internal Medicine

## 2023-04-13 ENCOUNTER — Other Ambulatory Visit: Payer: Self-pay | Admitting: Cardiovascular Disease

## 2023-04-14 ENCOUNTER — Other Ambulatory Visit (HOSPITAL_BASED_OUTPATIENT_CLINIC_OR_DEPARTMENT_OTHER): Payer: Self-pay

## 2023-04-14 ENCOUNTER — Other Ambulatory Visit: Payer: Self-pay

## 2023-04-14 MED ORDER — PREDNISONE 5 MG PO TABS
5.0000 mg | ORAL_TABLET | Freq: Every day | ORAL | 1 refills | Status: DC
  Filled 2023-04-14: qty 90, 90d supply, fill #0
  Filled 2023-07-20: qty 90, 90d supply, fill #1

## 2023-04-14 MED ORDER — CILOSTAZOL 100 MG PO TABS
100.0000 mg | ORAL_TABLET | Freq: Two times a day (BID) | ORAL | 2 refills | Status: DC
  Filled 2023-04-14 – 2023-04-27 (×2): qty 180, 90d supply, fill #0
  Filled 2023-07-20: qty 180, 90d supply, fill #1
  Filled 2023-10-29: qty 180, 90d supply, fill #2

## 2023-04-14 MED ORDER — METOPROLOL SUCCINATE ER 25 MG PO TB24
75.0000 mg | ORAL_TABLET | Freq: Two times a day (BID) | ORAL | 2 refills | Status: DC
  Filled 2023-04-14 – 2023-05-06 (×2): qty 540, 90d supply, fill #0
  Filled 2023-07-20: qty 540, 90d supply, fill #1
  Filled 2023-11-01: qty 540, 90d supply, fill #2

## 2023-04-14 MED ORDER — AMLODIPINE BESYLATE 2.5 MG PO TABS
1.2500 mg | ORAL_TABLET | Freq: Every day | ORAL | 2 refills | Status: DC
  Filled 2023-04-14 – 2023-05-06 (×2): qty 45, 90d supply, fill #0

## 2023-04-14 NOTE — Telephone Encounter (Signed)
Pharmacy requested refill Pended and sent to Dr. Chales Abrahams for approval

## 2023-04-21 ENCOUNTER — Other Ambulatory Visit (HOSPITAL_BASED_OUTPATIENT_CLINIC_OR_DEPARTMENT_OTHER): Payer: Self-pay

## 2023-04-21 DIAGNOSIS — G894 Chronic pain syndrome: Secondary | ICD-10-CM | POA: Diagnosis not present

## 2023-04-21 DIAGNOSIS — Z79891 Long term (current) use of opiate analgesic: Secondary | ICD-10-CM | POA: Diagnosis not present

## 2023-04-21 DIAGNOSIS — M48062 Spinal stenosis, lumbar region with neurogenic claudication: Secondary | ICD-10-CM | POA: Diagnosis not present

## 2023-04-21 DIAGNOSIS — M47812 Spondylosis without myelopathy or radiculopathy, cervical region: Secondary | ICD-10-CM | POA: Diagnosis not present

## 2023-04-21 MED ORDER — BUTALBITAL-APAP-CAFFEINE 50-325-40 MG PO TABS
1.0000 | ORAL_TABLET | Freq: Three times a day (TID) | ORAL | 2 refills | Status: DC | PRN
  Filled 2023-04-21 – 2023-05-20 (×2): qty 90, 30d supply, fill #0
  Filled 2023-06-19: qty 90, 30d supply, fill #1
  Filled 2023-07-20: qty 90, 30d supply, fill #2

## 2023-04-21 MED ORDER — HYDROCODONE-ACETAMINOPHEN 10-325 MG PO TABS
1.0000 | ORAL_TABLET | Freq: Three times a day (TID) | ORAL | 0 refills | Status: DC
  Filled 2023-04-21 (×2): qty 90, 30d supply, fill #0

## 2023-04-22 ENCOUNTER — Encounter: Payer: Self-pay | Admitting: Cardiovascular Disease

## 2023-04-22 ENCOUNTER — Ambulatory Visit: Payer: Medicare Other | Attending: Cardiovascular Disease | Admitting: Cardiovascular Disease

## 2023-04-22 VITALS — BP 142/90 | HR 67 | Ht 72.0 in | Wt 207.6 lb

## 2023-04-22 DIAGNOSIS — Z952 Presence of prosthetic heart valve: Secondary | ICD-10-CM | POA: Diagnosis not present

## 2023-04-22 DIAGNOSIS — I25119 Atherosclerotic heart disease of native coronary artery with unspecified angina pectoris: Secondary | ICD-10-CM | POA: Diagnosis not present

## 2023-04-22 DIAGNOSIS — I4819 Other persistent atrial fibrillation: Secondary | ICD-10-CM | POA: Insufficient documentation

## 2023-04-22 DIAGNOSIS — I10 Essential (primary) hypertension: Secondary | ICD-10-CM | POA: Diagnosis not present

## 2023-04-22 DIAGNOSIS — I5032 Chronic diastolic (congestive) heart failure: Secondary | ICD-10-CM | POA: Diagnosis not present

## 2023-04-22 NOTE — Progress Notes (Signed)
 Cardiology Office Note:    Date:  04/22/2023   ID:  Kevin Kass, [name], DOB May 07, 1934, MRN [id number]  PCP:  Mahlon Gammon, [name]   [facility] Providers Cardiologist:  Jodelle Red, [name]     Referring [name]: Mahlon Gammon, [name]   Chief Complaint  Patient presents with   Atrial Fibrillation    History of Present Illness:    Kevin Kass, [name] is a 89 y.o. male with a hx of longstanding atypical atrial flutter and atrial tachycardia, coronary artery disease with chronic angina, aortic valve disease status post bioprosthetic aortic valve replacement, orthostatic hypotension, and chronic diastolic heart failure.   The patient is here with his nurse, [name], today.  He reports no major change in his symptoms.  He continues to have exertional angina after eating.  This has become commonplace for him and he occasionally takes just a portion of sublingual nitroglycerin because he has such a strong reaction to it.  Symptoms ease off over time.  He has undergone cardiac catheterization for further evaluation and he was not found to have any high-grade obstructive CAD.  Ongoing medical therapy has been recommended.  He has not been taking amlodipine very often because his blood pressure has been well-controlled.  The patient denies edema, orthopnea, or PND.  His chronic dyspnea is unchanged.  Current Medications: Current Meds  Medication Sig   amLODipine (NORVASC) 2.5 MG tablet Take 0.5 tablets (1.25 mg total) by mouth daily. (Patient taking differently: Take 2.5 mg by mouth as needed. Patient taking 1-2 tablets every week)   apixaban (ELIQUIS) 5 MG TABS tablet Take 1 tablet (5 mg total) by mouth 2 (two) times daily.   butalbital-acetaminophen-caffeine (BAC) 50-325-40 MG tablet Take 1 tablet by mouth 3 (three) times daily as needed.   butalbital-acetaminophen-caffeine (BAC) 50-325-40 MG tablet Take 1 tablet by mouth three times a day as needed pt aware of acetaminophen in Norco as  well   cholecalciferol (VITAMIN D3) 25 MCG (1000 UNIT) tablet Take 1,000 Units by mouth daily.   cilostazol (PLETAL) 100 MG tablet Take 1 tablet (100 mg total) by mouth 2 (two) times daily.   Coenzyme Q10 200 MG TABS Take 200 mg by mouth daily.   desoximetasone (TOPICORT) 0.25 % cream Apply 1 Application topically 2 (two) times daily.   diclofenac Sodium (VOLTAREN) 1 % GEL Apply 2 g topically 2 (two) times daily as needed (knee pain).   digoxin (LANOXIN) 0.125 MG tablet Take 1 tablet (0.125 mg total) by mouth daily.   doxazosin (CARDURA) 4 MG tablet Take 1 tablet (4 mg total) by mouth daily. (Patient taking differently: Take 4 mg by mouth at bedtime.)   ezetimibe (ZETIA) 10 MG tablet Take 1 tablet (10 mg total) by mouth daily. (Patient taking differently: Take 10 mg by mouth at bedtime.)   furosemide (LASIX) 20 MG tablet Take 20 mg by mouth daily as needed for fluid.   HYDROcodone-acetaminophen (NORCO) 10-325 MG tablet Take 1 tablet by mouth 3 (three) times daily as needed   linaclotide (LINZESS) 145 MCG CAPS capsule Take 1 capsule (145 mcg total) by mouth daily before breakfast.   LORazepam (ATIVAN) 1 MG tablet Take 1 tablet (1 mg) by mouth at bedtime, and 0.5 tablet (0.5 mg) daily as needed for anxiety.   magnesium oxide (MAG-OX) 400 MG tablet Take 1 tablet (400 mg total) by mouth daily.   metoprolol succinate (TOPROL XL) 25 MG 24 hr tablet Take 3 tablets (75 mg  total) by mouth 2 (two) times daily. (Patient taking differently: Take 75 mg by mouth 2 (two) times daily. Patient taking 2 tablets twice a day)   Multiple Vitamins-Minerals (MULTIVITAMIN WITH MINERALS) tablet Take 1 tablet by mouth daily.   naloxegol oxalate (MOVANTIK) 25 MG TABS tablet Take 1 tablet (25 mg total) by mouth at bedtime.   nitroGLYCERIN (NITROSTAT) 0.4 MG SL tablet Place 1 tablet (0.4 mg total) under the tongue every 5 (five) minutes as needed for chest pain.   Omeprazole-Sodium Bicarbonate (ZEGERID PO) Take 1 capsule by  mouth as needed (acid reflux).   pantoprazole (PROTONIX) 40 MG tablet Take 1 tablet (40 mg total) by mouth daily.   pneumococcal 20-valent conjugate vaccine (PREVNAR 20) 0.5 ML injection Inject into the muscle.   Polyethyl Glycol-Propyl Glycol (SYSTANE OP) Place 1 drop into both eyes daily as needed (dry eyes).   polyethylene glycol (MIRALAX / GLYCOLAX) 17 g packet Take 17 g by mouth daily. Patient taking 1-2 everyday   potassium chloride SA (KLOR-CON M) 20 MEQ tablet Take 1 tablet (20 mEq total) by mouth daily.   predniSONE (DELTASONE) 5 MG tablet Take 1 tablet (5 mg total) by mouth daily with breakfast.   rosuvastatin (CRESTOR) 20 MG tablet Take 1 tablet (20 mg total) by mouth at bedtime. Please keep scheduled appointment for future refills.   sennosides-docusate sodium (SENOKOT-S) 8.6-50 MG tablet Take 1 tablet by mouth 2 (two) times daily.     Allergies:   Contrast media [iodinated contrast media], Gadolinium derivatives, Iodine-131, Metrizamide, and Tetanus toxoids   ROS:   Please see the history of present illness.    All other systems reviewed and are negative.  EKGs/Labs/Other Studies Reviewed:    The following studies were reviewed today: Cardiac Studies & Procedures   ______________________________________________________________________________________________ CARDIAC CATHETERIZATION  CARDIAC CATHETERIZATION 07/06/2022  Narrative   Prox RCA lesion is 30% stenosed.   Mid RCA lesion is 40% stenosed.   Mid Cx to Dist Cx lesion is 20% stenosed.   Ost LAD to Prox LAD lesion is 40% stenosed.   Non-stenotic Dist RCA lesion was previously treated.   LV end diastolic pressure is normal.  Widely patent left main with no stenosis Nonobstructive proximal LAD stenosis unchanged from prior cath studies Widely patent LCx stent with no significant stenosis in the LCx distribution Patent RCA with mild nonobstructive disease and patent distal RCA stent Normal LVEDP  Recommend: continue  antianginal Rx and medical treatment of nonobstructive CAD. Resume apixaban tomorrow morning.  Findings Coronary Findings Diagnostic  Dominance: Right  Left Main Vessel is angiographically normal. Patent left main with mild calcification and no stenosis. Large vessel. Divides into the LAD, LCx, and intermediate/high OM branch.  Left Anterior Descending Vessel is large. There is mild diffuse disease throughout the vessel. Patent vessel, mild-moderate diffuse proximal plaquing with no high-grade stenosis, unchanged from the prior cath study. Ost LAD to Prox LAD lesion is 40% stenosed.  Left Circumflex The circumflex is patent with no significant stenosis. The stent remains widely patent with mild diffuse ISR. The OM branches are patent. Mid Cx to Dist Cx lesion is 20% stenosed. The lesion was previously treated over 2 years ago. Stented segment in the LCx remains patent with no significant stenosis  Right Coronary Artery There is mild diffuse disease throughout the vessel. Large, dominant vessel. There are diffuse irregularities with mild stenoses in the proximal and mid vessel. The distal vessel is widely patent. The PDA and PLA branches are patent.  The stented segment in the distal RCA is patent with no stenosis. The PDA supplies a large territory of the inferior wall. There is no significant change in direct comparison to prior cath studies. Prox RCA lesion is 30% stenosed. The lesion is calcified. Mid RCA lesion is 40% stenosed. The lesion is calcified. No change from prior study Non-stenotic Dist RCA lesion was previously treated. Stent remains widely patent with no restenosis  Intervention  No interventions have been documented.   CARDIAC CATHETERIZATION  CARDIAC CATHETERIZATION 10/07/2020  Narrative   Prox RCA lesion is 30% stenosed.   Mid RCA lesion is 40% stenosed.   Prox LAD to Mid LAD lesion is 40% stenosed.   Previously placed Mid Cx to Dist Cx stent (unknown type) is   widely patent.   Non-stenotic Dist RCA lesion was previously treated.  Patent left main with no stenosis Patent LAD with mild-moderate proximal stenosis unchanged from previous cardiac cath studies Patent LCx stent with no significant stenosis Patent RCA with mild diffuse plaquing and patent stent in the distal RCA Normal LV filling pressure and no transaortic gradient across bioprosthetic aortic valve  Recommend: medical therapy.  Note the patient has frequent runs of PAT throughout the procedure with HR above 160 bpm and drop in blood pressure down to the 70's during these runs  Findings Coronary Findings Diagnostic  Dominance: Right  Left Main Vessel is angiographically normal. Patent left main with mild calcification and no stenosis.  Left Anterior Descending Prox LAD to Mid LAD lesion is 40% stenosed. Diffuse calcific disease in the proximal LAD is present. This is mild-moderate in severity and unchanged from prior cath studies. Stenosis estimated at 40-50%.  Left Circumflex Previously placed Mid Cx to Dist Cx stent (unknown type) is  widely patent. Stented segment in the LCx remains patent with no significant stenosis  Right Coronary Artery There is mild diffuse disease throughout the vessel. Large, dominant vessel. There are diffuse irregularities with mild stenoses in the proximal and mid vessel. The distal vessel is widely patent. The PDA and PLA branches are patent. The stented segment in the distal RCA is patent with no stenosis. Prox RCA lesion is 30% stenosed. The lesion is calcified. Mid RCA lesion is 40% stenosed. The lesion is calcified. No change from prior study Non-stenotic Dist RCA lesion was previously treated. Stent remains widely patent with no restenosis  Intervention  No interventions have been documented.   STRESS TESTS  MYOCARDIAL PERFUSION IMAGING 02/13/2019  Narrative  Nuclear stress EF: 48%. Visually, the EF appears to be normal. Echo from  today shows normal EF of 55-60%.  There was no ST segment deviation noted during stress.  This is a low risk study. There is no evidence of ischemia or previous infarction.  The study is normal.   ECHOCARDIOGRAM  ECHOCARDIOGRAM COMPLETE 04/06/2022  Narrative ECHOCARDIOGRAM REPORT    Patient Name:   [name] Date of Exam: 04/06/2022 Medical Rec #:  [id number]        Height:       73.0 in Accession #:    [id number]       Weight:       226.1 lb Date of Birth:  08-Jun-1934         BSA:          2.267 m Patient Age:    87 years         BP:           98/58 mmHg  Patient Gender: M                HR:           66 bpm. Exam Location:  [address]  Procedure: 2D Echo, Cardiac Doppler, Color Doppler and Strain Analysis  Indications:    I50.32 CHF  History:        Patient has prior history of Echocardiogram examinations, most recent 04/13/2021. CHF, CAD, Pacemaker, Stroke, Aortic Valve Disease, Arrythmias:Atrial Fibrillation, Signs/Symptoms:Shortness of Breath, Fatigue and Murmur; Risk Factors:Family History of Coronary Artery Disease, Hypertension and Dyslipidemia. Ocular Myasthenia, Aortic Stenosis status post Aortic Valve Replacement (2013). Aortic Valve: 25 mm Edwards MagnaEase valve is present in the aortic position. Procedure Date: 2013.  Sonographer:    Farrel Conners RDCS Referring Phys: Freddie Breech GUPTA  IMPRESSIONS   1. Left ventricular ejection fraction, by estimation, is 60 to 65%. Left ventricular ejection fraction by PLAX is 65 %. The left ventricle has normal function. The left ventricle has no regional wall motion abnormalities. There is mild left ventricular hypertrophy. Left ventricular diastolic function could not be evaluated. 2. Right ventricular systolic function is normal. The right ventricular size is moderately enlarged. There is moderately elevated pulmonary artery systolic pressure. The estimated right ventricular systolic pressure is 45.9 mmHg. 3. Left  atrial size was severely dilated. 4. Right atrial size was severely dilated. 5. The mitral valve is abnormal. Mild mitral valve regurgitation. Moderate to severe mitral annular calcification. 6. The aortic valve has been repaired/replaced. Aortic valve regurgitation is trivial. There is a 25 mm Edwards MagnaEase valve present in the aortic position. Procedure Date: 2013. Echo findings are consistent with normal structure and function of the aortic valve prosthesis. Aortic valve mean gradient measures 8.0 mmHg. Aortic valve Vmax measures 1.91 m/s. Peak gradient 14.6 mmHg. DI is 0.44. 7. Aortic dilatation noted. There is mild dilatation of the ascending aorta, measuring 43 mm. 8. The inferior vena cava is dilated in size with <50% respiratory variability, suggesting right atrial pressure of 15 mmHg.  Comparison(s): Changes from prior study are noted. 04/13/2021: LVEF 60-65%, AVR with mean gradient 14 mmHg, RVSP 41.6 mmHg.  FINDINGS Left Ventricle: Left ventricular ejection fraction, by estimation, is 60 to 65%. Left ventricular ejection fraction by PLAX is 65 %. The left ventricle has normal function. The left ventricle has no regional wall motion abnormalities. The left ventricular internal cavity size was normal in size. There is mild left ventricular hypertrophy. Left ventricular diastolic function could not be evaluated due to atrial fibrillation. Left ventricular diastolic function could not be evaluated.  Right Ventricle: The right ventricular size is moderately enlarged. No increase in right ventricular wall thickness. Right ventricular systolic function is normal. There is moderately elevated pulmonary artery systolic pressure. The tricuspid regurgitant velocity is 2.78 m/s, and with an assumed right atrial pressure of 15 mmHg, the estimated right ventricular systolic pressure is 45.9 mmHg.  Left Atrium: Left atrial size was severely dilated.  Right Atrium: Right atrial size was severely  dilated.  Pericardium: There is no evidence of pericardial effusion.  Mitral Valve: The mitral valve is abnormal. Moderate to severe mitral annular calcification. Mild mitral valve regurgitation.  Tricuspid Valve: The tricuspid valve is grossly normal. Tricuspid valve regurgitation is trivial.  Aortic Valve: The aortic valve has been repaired/replaced. Aortic valve regurgitation is trivial. Aortic valve mean gradient measures 8.0 mmHg. Aortic valve peak gradient measures 14.6 mmHg. Aortic valve area, by VTI measures 2.00 cm. There is a 25  mm [facility] valve present in the aortic position. Procedure Date: 2013. Echo findings are consistent with normal structure and function of the aortic valve prosthesis.  Pulmonic Valve: The pulmonic valve was grossly normal. Pulmonic valve regurgitation is trivial.  Aorta: Aortic dilatation noted. There is mild dilatation of the ascending aorta, measuring 43 mm.  Venous: The inferior vena cava is dilated in size with less than 50% respiratory variability, suggesting right atrial pressure of 15 mmHg.  IAS/Shunts: No atrial level shunt detected by color flow Doppler.  Additional Comments: A device lead is visualized.   LEFT VENTRICLE PLAX 2D LV EF:         Left            Diastology ventricular     LV e' medial:    7.29 cm/s ejection        LV E/e' medial:  14.0 fraction by     LV e' lateral:   10.20 cm/s PLAX is 65      LV E/e' lateral: 10.0 %. LVIDd:         5.50 cm LVIDs:         3.50 cm LV PW:         1.20 cm LV IVS:        1.30 cm LVOT diam:     2.40 cm LV SV:         75 LV SV Index:   33 LVOT Area:     4.52 cm   RIGHT VENTRICLE RV Basal diam:  5.10 cm RV Mid diam:    3.70 cm RV S prime:     11.80 cm/s TAPSE (M-mode): 1.8 cm  LEFT ATRIUM              Index        RIGHT ATRIUM           Index LA diam:        5.70 cm  2.51 cm/m   RA Area:     29.80 cm LA Vol (A2C):   154.0 ml 67.94 ml/m  RA Volume:   110.00 ml 48.53  ml/m LA Vol (A4C):   78.4 ml  34.59 ml/m LA Biplane Vol: 114.0 ml 50.29 ml/m AORTIC VALVE AV Area (Vmax):    1.95 cm AV Area (Vmean):   1.89 cm AV Area (VTI):     2.00 cm AV Vmax:           191.00 cm/s AV Vmean:          127.000 cm/s AV VTI:            0.373 m AV Peak Grad:      14.6 mmHg AV Mean Grad:      8.0 mmHg LVOT Vmax:         82.18 cm/s LVOT Vmean:        52.960 cm/s LVOT VTI:          0.165 m LVOT/AV VTI ratio: 0.44  AORTA Ao Root diam: 3.50 cm Ao Asc diam:  4.30 cm  MITRAL VALVE                TRICUSPID VALVE MV Area (PHT): cm          TR Peak grad:   30.9 mmHg MV Decel Time: 242 msec     TR Vmax:        278.00 cm/s MV E velocity: 101.83 cm/s SHUNTS Systemic VTI:  0.16 m Systemic Diam: 2.40  cm  Zoila Shutter [name] Electronically signed by Zoila Shutter [name] Signature Date/Time: 04/06/2022/9:04:07 PM    Final    MONITORS  LONG TERM MONITOR (3-14 DAYS) 11/11/2020  Narrative 1. Atrial flutter with a CVR 2. Rare PVC's and NSVT 3. No prolonged pauses 4. Brief episodes of RVR with an ave HR of 63/min and briefly up to 130/min.   Gregg Taylor,[name]  Patch Wear Time:  2 days and 23 hours (2022-09-09T16:34:38-0400 to 2022-09-12T15:40:33-0400)  1 run of Ventricular Tachycardia occurred lasting 11 beats with a max rate of 169 bpm (avg 153 bpm). Atrial Flutter occurred continuously (100% burden), ranging from 58-130 bpm (avg of 63 bpm). Ventricular Pacing was present. Isolated VEs were occasional (2.4%, 6112), VE Couplets were occasional (1.1%, 1473), and VE Triplets were rare (<1.0%, 2). Ventricular Bigeminy and Trigeminy were present.       ______________________________________________________________________________________________      EKG:   EKG Interpretation Date/Time:  Friday April 22 2023 11:04:57 EST Ventricular Rate:  67 PR Interval:    QRS Duration:  142 QT Interval:  452 QTC Calculation: 477 R Axis:   -69  Text Interpretation: Atrial  fibrillation with occasional ventricular-paced complexes Right bundle branch block Left anterior fascicular block Bifascicular block Left ventricular hypertrophy with repolarization abnormality ( R in aVL , Romhilt-Estes ) When compared with ECG of 12-Apr-2021 14:34, No significant change was found Confirmed by Tonny Bollman [id number]) on 04/22/2023 11:16:30 AM    Recent Labs: 08/27/2022: TSH 4.506 02/09/2023: ALT 16; BUN 12; Creatinine 0.88; Hemoglobin 11.1; Platelet Count 212; Potassium 4.0; Sodium 140  Recent Lipid Panel    Component Value Date/Time   CHOL 131 04/13/2021 0109   CHOL 158 09/02/2017 1006   TRIG 27 04/13/2021 0109   HDL 62 04/13/2021 0109   HDL 72 09/02/2017 1006   CHOLHDL 2.1 04/13/2021 0109   VLDL 5 04/13/2021 0109   LDLCALC 64 04/13/2021 0109   LDLCALC 68 09/02/2017 1006   LDLDIRECT 117.9 11/16/2010 1355     Risk Assessment/Calculations:    CHA2DS2-VASc Score = 5   This indicates a 7.2% annual risk of stroke. The patient's score is based upon: CHF History: 1 HTN History: 1 Diabetes History: 0 Stroke History: 0 Vascular Disease History: 1 Age Score: 2 Gender Score: 0           Physical Exam:    VS:  BP (!) 142/90   Pulse 67   Ht 6' (1.829 m)   Wt 207 lb 9.6 oz (94.2 kg)   SpO2 93%   BMI 28.16 kg/m     Wt Readings from Last 3 Encounters:  04/22/23 207 lb 9.6 oz (94.2 kg)  02/14/23 206 lb 6.4 oz (93.6 kg)  02/07/23 206 lb (93.4 kg)     GEN:  Well nourished, well developed in no acute distress HEENT: Normal NECK: No JVD; No carotid bruits LYMPHATICS: No lymphadenopathy CARDIAC: Irregularly irregular, 2/6 ejection murmur at the right upper sternal border RESPIRATORY:  Clear to auscultation without rales, wheezing or rhonchi  ABDOMEN: Soft, non-tender, non-distended MUSCULOSKELETAL:  No edema; No deformity  SKIN: Warm and dry NEUROLOGIC:  Alert and oriented x 3 PSYCHIATRIC:  Normal affect   Assessment & Plan Coronary artery disease  involving native coronary artery of native heart with angina pectoris [facility]) He continues to have significant lifestyle limiting angina most notable after he eats.  He has undergone cardiac catheterization demonstrating no high-grade obstructive CAD.  Seems to have a component of microvascular dysfunction and  myocardial ischemia related to splanchnic redistribution of blood flow.  He has modified his lifestyle with small meals and he is on maximally tolerated antianginal therapy.  No changes are made today. Persistent atrial fibrillation (HCC) Rate controlled with metoprolol succinate and anticoagulated with apixaban.  Also on digoxin.  No changes. S/P AVR (aortic valve replacement) LVEF has been preserved at 60 to 65% by echo 1 year ago.  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  Mean gradient 8 mmHg with no aortic insufficiency.  Will update an echocardiogram for surveillance. Essential hypertension Labile blood pressure, known autonomic dysfunction.  Continue current management. Chronic diastolic heart failure (HCC) Clinically stable with no evidence of volume overload on exam.            Medication Adjustments/Labs and Tests Ordered: Current medicines are reviewed at length with the patient today.  Concerns regarding medicines are outlined above.  Orders Placed This Encounter  Procedures   EKG 12-Lead   ECHOCARDIOGRAM COMPLETE   No orders of the defined types were placed in this encounter.   Patient Instructions  Testing/Procedures: ECHO Your physician has requested that you have an echocardiogram. Echocardiography is a painless test that uses sound waves to create images of your heart. It provides your doctor with information about the size and shape of your heart and how well your heart's chambers and valves are working. This procedure takes approximately one hour. There are no restrictions for this procedure. Please do NOT wear cologne, perfume,  aftershave, or lotions (deodorant is allowed). Please arrive 15 minutes prior to your appointment time.  Please note: We ask at that you not bring children with you during ultrasound (echo/ vascular) testing. Due to room size and safety concerns, children are not allowed in the ultrasound rooms during exams. Our front office staff cannot provide observation of children in our lobby area while testing is being conducted. An adult accompanying a patient to their appointment will only be allowed in the ultrasound room at the discretion of the ultrasound technician under special circumstances. We apologize for any inconvenience.  Follow-Up: At [facility], you and your health needs are our priority.  As part of our continuing mission to provide you with exceptional heart care, we have created designated Provider Care Teams.  These Care Teams include your primary Cardiologist (physician) and Advanced Practice Providers (APPs -  Physician Assistants and Nurse Practitioners) who all work together to provide you with the care you need, when you need it.  Your next appointment:   6 month(s)  Provider:   Tonny Bollman, [name]   1st Floor: - Lobby - Registration  - Pharmacy  - Lab - Cafe  2nd Floor: - PV Lab - Diagnostic Testing (echo, CT, nuclear med)  3rd Floor: - Vacant  4th Floor: - TCTS (cardiothoracic surgery) - AFib Clinic - Structural Heart Clinic - Vascular Surgery  - Vascular Ultrasound  5th Floor: - HeartCare Cardiology (general and EP) - Clinical Pharmacy for coumadin, hypertension, lipid, weight-loss medications, and med management appointments    Valet parking services will be available as well.     Signed, Tonny Bollman, [name]  04/22/2023 5:43 PM    [facility]

## 2023-04-22 NOTE — Patient Instructions (Signed)
 Testing/Procedures: ECHO Your physician has requested that you have an echocardiogram. Echocardiography is a painless test that uses sound waves to create images of your heart. It provides your doctor with information about the size and shape of your heart and how well your heart's chambers and valves are working. This procedure takes approximately one hour. There are no restrictions for this procedure. Please do NOT wear cologne, perfume, aftershave, or lotions (deodorant is allowed). Please arrive 15 minutes prior to your appointment time.  Please note: We ask at that you not bring children with you during ultrasound (echo/ vascular) testing. Due to room size and safety concerns, children are not allowed in the ultrasound rooms during exams. Our front office staff cannot provide observation of children in our lobby area while testing is being conducted. An adult accompanying a patient to their appointment will only be allowed in the ultrasound room at the discretion of the ultrasound technician under special circumstances. We apologize for any inconvenience.  Follow-Up: At [facility], you and your health needs are our priority.  As part of our continuing mission to provide you with exceptional heart care, we have created designated Provider Care Teams.  These Care Teams include your primary Cardiologist (physician) and Advanced Practice Providers (APPs -  Physician Assistants and Nurse Practitioners) who all work together to provide you with the care you need, when you need it.  Your next appointment:   6 month(s)  Provider:   Tonny Bollman, MD   1st Floor: - Lobby - Registration  - Pharmacy  - Lab - Cafe  2nd Floor: - PV Lab - Diagnostic Testing (echo, CT, nuclear med)  3rd Floor: - Vacant  4th Floor: - TCTS (cardiothoracic surgery) - AFib Clinic - Structural Heart Clinic - Vascular Surgery  - Vascular Ultrasound  5th Floor: - [facility] (general and  EP) - Clinical Pharmacy for coumadin, hypertension, lipid, weight-loss medications, and med management appointments    Valet parking services will be available as well.

## 2023-04-27 ENCOUNTER — Other Ambulatory Visit (HOSPITAL_BASED_OUTPATIENT_CLINIC_OR_DEPARTMENT_OTHER): Payer: Self-pay

## 2023-04-30 NOTE — Assessment & Plan Note (Signed)
 Labile blood pressure, known autonomic dysfunction.  Continue current management.

## 2023-04-30 NOTE — Assessment & Plan Note (Signed)
 Rate controlled with metoprolol succinate and anticoagulated with apixaban.  Also on digoxin.  No changes.

## 2023-04-30 NOTE — Assessment & Plan Note (Signed)
 LVEF has been preserved at 60 to 65% by echo 1 year ago.  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  Mean gradient 8 mmHg with no aortic insufficiency.  Will update an echocardiogram for surveillance.

## 2023-05-03 DIAGNOSIS — N401 Enlarged prostate with lower urinary tract symptoms: Secondary | ICD-10-CM | POA: Diagnosis not present

## 2023-05-03 DIAGNOSIS — R3914 Feeling of incomplete bladder emptying: Secondary | ICD-10-CM | POA: Diagnosis not present

## 2023-05-04 ENCOUNTER — Ambulatory Visit: Payer: Self-pay | Admitting: Internal Medicine

## 2023-05-04 NOTE — Telephone Encounter (Signed)
 Spoke with patient this to confirm that he had spoken with the [place] from [facility] and is requesting to do CVC Lab work done at [facility] because he is Pale and having bloody stool, Patient does not want to go the hospital. Please Advise   Message sent to Mahlon Gammon, MD

## 2023-05-04 NOTE — Telephone Encounter (Signed)
  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 Pt confirms that he has been having dark stools and takes 2 blood thinners daily. Per wife he is pale. Pt advised to seek ED treatment. Pt denies. Pt request CBC order be placed by PCP. Pt states that he is aware of the risks of potential GI bleed.   Copied from CRM [id number]. Topic: Clinical - Red Word Triage >> May 04, 2023  8:39 AM Philippa Chester F wrote: Red Word that prompted transfer to Nurse Triage:   Pale; Bloody Stool Reason for Disposition . Pale skin (pallor) of new-onset or worsening  Answer Assessment - Initial Assessment Questions 7. RECURRENT SYMPTOMS: "Have you had blood in your stools before?" If Yes, ask: "When was the last time?" and "What happened that time?"      yes 8. BLOOD THINNERS: "Do you take any blood thinners?" (e.g., Coumadin/warfarin, Pradaxa/dabigatran, aspirin)    Takes 2 blood thinners  Protocols used: Rectal Bleeding-A-AH

## 2023-05-04 NOTE — Telephone Encounter (Signed)
 Talked to the patient. He is not sure if he is having bleeding or not but he is suspecting it. Wants CBC done. Will order for Tomorrow.Talked to the nurse in [facility]. He is aware that he needs to go to ED if he has any blood in stool as he is on Eliquis

## 2023-05-05 DIAGNOSIS — E15 Nondiabetic hypoglycemic coma: Secondary | ICD-10-CM | POA: Diagnosis not present

## 2023-05-05 DIAGNOSIS — K629 Disease of anus and rectum, unspecified: Secondary | ICD-10-CM | POA: Diagnosis not present

## 2023-05-05 LAB — CBC AND DIFFERENTIAL
HCT: 33 — AB (ref 41–53)
Hemoglobin: 11.2 — AB (ref 13.5–17.5)
Platelets: 207 K/uL (ref 150–400)
WBC: 6.5

## 2023-05-05 LAB — CBC: RBC: 3.83 — AB (ref 3.87–5.11)

## 2023-05-05 LAB — LAB REPORT - SCANNED: A1c: 6.9

## 2023-05-05 LAB — HEMOGLOBIN A1C: Hemoglobin A1C: 6.9

## 2023-05-06 ENCOUNTER — Other Ambulatory Visit (HOSPITAL_BASED_OUTPATIENT_CLINIC_OR_DEPARTMENT_OTHER): Payer: Self-pay

## 2023-05-08 ENCOUNTER — Other Ambulatory Visit: Payer: Self-pay | Admitting: Internal Medicine

## 2023-05-08 ENCOUNTER — Other Ambulatory Visit: Payer: Self-pay | Admitting: Cardiovascular Disease

## 2023-05-08 DIAGNOSIS — I4819 Other persistent atrial fibrillation: Secondary | ICD-10-CM

## 2023-05-09 ENCOUNTER — Other Ambulatory Visit: Payer: Self-pay

## 2023-05-09 ENCOUNTER — Other Ambulatory Visit (HOSPITAL_BASED_OUTPATIENT_CLINIC_OR_DEPARTMENT_OTHER): Payer: Self-pay

## 2023-05-09 MED ORDER — DIGOXIN 125 MCG PO TABS
0.1250 mg | ORAL_TABLET | Freq: Every day | ORAL | 1 refills | Status: DC
  Filled 2023-05-09 – 2023-05-24 (×2): qty 90, 90d supply, fill #0
  Filled 2023-08-27: qty 90, 90d supply, fill #1

## 2023-05-09 MED ORDER — APIXABAN 5 MG PO TABS
5.0000 mg | ORAL_TABLET | Freq: Two times a day (BID) | ORAL | 1 refills | Status: DC
  Filled 2023-05-09 – 2023-05-24 (×2): qty 180, 90d supply, fill #0
  Filled 2023-09-15: qty 180, 90d supply, fill #1

## 2023-05-09 NOTE — Telephone Encounter (Signed)
 Prescription refill request for Eliquis received. Indication:afib Last office visit:2/25 Scr:0.88  12/24 Age: 89 Weight:94.2  kg  Prescription refilled

## 2023-05-12 ENCOUNTER — Other Ambulatory Visit (HOSPITAL_BASED_OUTPATIENT_CLINIC_OR_DEPARTMENT_OTHER): Payer: Self-pay

## 2023-05-12 ENCOUNTER — Ambulatory Visit (HOSPITAL_COMMUNITY): Payer: Medicare Other

## 2023-05-12 ENCOUNTER — Encounter: Payer: Self-pay | Admitting: Cardiovascular Disease

## 2023-05-12 DIAGNOSIS — I5032 Chronic diastolic (congestive) heart failure: Secondary | ICD-10-CM | POA: Diagnosis not present

## 2023-05-12 DIAGNOSIS — I4819 Other persistent atrial fibrillation: Secondary | ICD-10-CM | POA: Insufficient documentation

## 2023-05-12 DIAGNOSIS — Z952 Presence of prosthetic heart valve: Secondary | ICD-10-CM | POA: Diagnosis not present

## 2023-05-12 LAB — ECHOCARDIOGRAM COMPLETE
AR max vel: 2.71 cm2
AV Area VTI: 3.09 cm2
AV Area mean vel: 2.64 cm2
AV Mean grad: 9 mmHg
AV Peak grad: 17 mmHg
Ao pk vel: 2.06 m/s
S' Lateral: 3.7 cm

## 2023-05-12 NOTE — Progress Notes (Unsigned)
 Spoke with patient. BP running very high. Made the following recommendations:  Continue metoprolol succinate 50 mg BID Increase amlodipine to 5 mg daily Start telmisartan 20 mg daily  I called in new Rx's for him. Please arrange BMET in 2 weeks and FU visit with me in 4 weeks. thx

## 2023-05-13 ENCOUNTER — Other Ambulatory Visit: Payer: Self-pay

## 2023-05-13 ENCOUNTER — Other Ambulatory Visit (HOSPITAL_BASED_OUTPATIENT_CLINIC_OR_DEPARTMENT_OTHER): Payer: Self-pay

## 2023-05-13 DIAGNOSIS — Z79899 Other long term (current) drug therapy: Secondary | ICD-10-CM

## 2023-05-13 MED ORDER — TELMISARTAN 20 MG PO TABS
20.0000 mg | ORAL_TABLET | Freq: Every day | ORAL | 3 refills | Status: AC
  Filled 2023-05-13: qty 90, 90d supply, fill #0
  Filled 2023-07-20: qty 90, 90d supply, fill #1
  Filled 2023-07-28 – 2023-11-02 (×4): qty 90, 90d supply, fill #2
  Filled 2024-01-29: qty 90, 90d supply, fill #3

## 2023-05-13 MED ORDER — AMLODIPINE BESYLATE 5 MG PO TABS
5.0000 mg | ORAL_TABLET | Freq: Every day | ORAL | 3 refills | Status: AC
  Filled 2023-05-13 (×2): qty 90, 90d supply, fill #0
  Filled 2023-08-04: qty 90, 90d supply, fill #1
  Filled 2023-11-02: qty 90, 90d supply, fill #2
  Filled 2024-01-20: qty 90, 90d supply, fill #3

## 2023-05-13 NOTE — Progress Notes (Signed)
 BMET entered and released. Pt called and appt scheduled with Excell Seltzer on 06/10/23.

## 2023-05-16 ENCOUNTER — Other Ambulatory Visit (HOSPITAL_BASED_OUTPATIENT_CLINIC_OR_DEPARTMENT_OTHER): Payer: Self-pay

## 2023-05-20 ENCOUNTER — Other Ambulatory Visit (HOSPITAL_BASED_OUTPATIENT_CLINIC_OR_DEPARTMENT_OTHER): Payer: Self-pay

## 2023-05-24 ENCOUNTER — Encounter: Payer: Self-pay | Admitting: Gastroenterology

## 2023-05-24 ENCOUNTER — Other Ambulatory Visit (HOSPITAL_BASED_OUTPATIENT_CLINIC_OR_DEPARTMENT_OTHER): Payer: Self-pay

## 2023-05-24 ENCOUNTER — Other Ambulatory Visit: Payer: Self-pay | Admitting: Internal Medicine

## 2023-05-24 ENCOUNTER — Ambulatory Visit (INDEPENDENT_AMBULATORY_CARE_PROVIDER_SITE_OTHER): Payer: Medicare Other | Admitting: Gastroenterology

## 2023-05-24 ENCOUNTER — Other Ambulatory Visit (INDEPENDENT_AMBULATORY_CARE_PROVIDER_SITE_OTHER)

## 2023-05-24 VITALS — BP 130/70 | HR 62 | Ht 72.0 in | Wt 207.0 lb

## 2023-05-24 DIAGNOSIS — R1319 Other dysphagia: Secondary | ICD-10-CM

## 2023-05-24 DIAGNOSIS — K219 Gastro-esophageal reflux disease without esophagitis: Secondary | ICD-10-CM

## 2023-05-24 DIAGNOSIS — K649 Unspecified hemorrhoids: Secondary | ICD-10-CM

## 2023-05-24 DIAGNOSIS — R1310 Dysphagia, unspecified: Secondary | ICD-10-CM | POA: Diagnosis not present

## 2023-05-24 DIAGNOSIS — R1013 Epigastric pain: Secondary | ICD-10-CM

## 2023-05-24 DIAGNOSIS — K5909 Other constipation: Secondary | ICD-10-CM | POA: Diagnosis not present

## 2023-05-24 LAB — CBC WITH DIFFERENTIAL/PLATELET
Basophils Absolute: 0 10*3/uL (ref 0.0–0.1)
Basophils Relative: 0.5 % (ref 0.0–3.0)
Eosinophils Absolute: 0.2 10*3/uL (ref 0.0–0.7)
Eosinophils Relative: 3 % (ref 0.0–5.0)
HCT: 35.5 % — ABNORMAL LOW (ref 39.0–52.0)
Hemoglobin: 11.7 g/dL — ABNORMAL LOW (ref 13.0–17.0)
Lymphocytes Relative: 10 % — ABNORMAL LOW (ref 12.0–46.0)
Lymphs Abs: 0.8 10*3/uL (ref 0.7–4.0)
MCHC: 32.8 g/dL (ref 30.0–36.0)
MCV: 86.6 fl (ref 78.0–100.0)
Monocytes Absolute: 0.5 10*3/uL (ref 0.1–1.0)
Monocytes Relative: 6 % (ref 3.0–12.0)
Neutro Abs: 6.2 10*3/uL (ref 1.4–7.7)
Neutrophils Relative %: 80.5 % — ABNORMAL HIGH (ref 43.0–77.0)
Platelets: 201 10*3/uL (ref 150.0–400.0)
RBC: 4.1 Mil/uL — ABNORMAL LOW (ref 4.22–5.81)
RDW: 15 % (ref 11.5–15.5)
WBC: 7.7 10*3/uL (ref 4.0–10.5)

## 2023-05-24 LAB — COMPREHENSIVE METABOLIC PANEL
ALT: 12 U/L (ref 0–53)
AST: 16 U/L (ref 0–37)
Albumin: 4.3 g/dL (ref 3.5–5.2)
Alkaline Phosphatase: 108 U/L (ref 39–117)
BUN: 14 mg/dL (ref 6–23)
CO2: 31 meq/L (ref 19–32)
Calcium: 9.8 mg/dL (ref 8.4–10.5)
Chloride: 99 meq/L (ref 96–112)
Creatinine, Ser: 1.03 mg/dL (ref 0.40–1.50)
GFR: 65.68 mL/min (ref >60.00)
Glucose, Bld: 155 mg/dL — ABNORMAL HIGH (ref 70–99)
Potassium: 4.2 meq/L (ref 3.5–5.1)
Sodium: 139 meq/L (ref 135–145)
Total Bilirubin: 0.5 mg/dL (ref 0.2–1.2)
Total Protein: 7.8 g/dL (ref 6.0–8.3)

## 2023-05-24 LAB — AMYLASE: Amylase: 38 U/L (ref 27–131)

## 2023-05-24 LAB — LIPASE: Lipase: 32 U/L (ref 11.0–59.0)

## 2023-05-24 MED ORDER — HYDROCORTISONE ACETATE 25 MG RE SUPP
25.0000 mg | Freq: Every evening | RECTAL | 1 refills | Status: AC | PRN
  Filled 2023-05-24: qty 12, 12d supply, fill #0

## 2023-05-24 MED ORDER — HYDROCODONE-ACETAMINOPHEN 10-325 MG PO TABS
1.0000 | ORAL_TABLET | Freq: Three times a day (TID) | ORAL | 0 refills | Status: DC
Start: 2023-05-24 — End: 2023-06-01
  Filled 2023-05-24: qty 90, 30d supply, fill #0

## 2023-05-24 NOTE — Patient Instructions (Addendum)
 VISIT SUMMARY:  Today, we discussed your recent difficulties with swallowing, irregular bowel movements, and episodes of upper abdominal pain. We also reviewed your history of constipation and hemorrhoids. A plan was made to address each of these issues, including further diagnostic tests and adjustments to your current medications.  YOUR PLAN:  -CONSTIPATION: Constipation is when you have infrequent or hard-to-pass bowel movements. You should continue taking Miralax daily and adjust the dose to half on days when you have diarrhea. Do not stop taking Miralax completely to avoid constipation. Continue taking Linzess 145 mcg as it is effective for you.  -DYSPHAGIA: Dysphagia is difficulty swallowing. You will have a barium swallow study to check for reflux or motility issues. Continue taking Protonix once daily to manage your symptoms.  -EPIGASTRIC PAIN: Epigastric pain is pain in the upper abdomen. This could be due to gallstones or acid reflux. You will have an ultrasound to check for gallstones, and we may consider an upper endoscopy if the barium swallow shows significant findings.  -HEMORRHOIDS: Hemorrhoids are swollen veins in the lower rectum or anus that can cause bright red blood during bowel movements. Use hydrocortisone suppositories as needed to manage this condition.  INSTRUCTIONS:  Please complete the following tests: a barium swallow study, a right upper quadrant ultrasound, and blood tests (CBC and CMP). Schedule a follow-up appointment after these tests are completed.  You will be contacted by [facility] in the next 2 days to arrange a Right Upper Quadrant Ultrasound and Barium Esophogram.  The number on your caller ID will be [phone], please answer when they call.  If you have not heard from them in 2 days please call [phone] to schedule.  Make sure when they call they schedule you at [facility]  Due to recent changes in healthcare laws, you  may see the results of your imaging and laboratory studies on MyChart before your provider has had a chance to review them.  We understand that in some cases there may be results that are confusing or concerning to you. Not all laboratory results come back in the same time frame and the provider may be waiting for multiple results in order to interpret others.  Please give [place] 48 hours in order for your provider to thoroughly review all the results before contacting the office for clarification of your results.    I appreciate the  opportunity to care for you  Thank You   Marsa Aris , MD

## 2023-05-24 NOTE — Progress Notes (Signed)
 [name], [name]    [id number]    Jan 23, 1935  Primary Care Physician:Gupta, Freddie Breech, [name]  Referring Physician: Mahlon Gammon, [name] [address] [place],  [place] [address]   Chief complaint: Dysphagia, constipation  Discussed the use of AI scribe software for clinical note transcription with the patient, who gave verbal consent to proceed.  History of Present Illness   [name], [name] "[name]" is an 89 year old male who presents with difficulty swallowing and bowel movement irregularities.  He experiences difficulty swallowing, particularly when lying down at night, with a sensation of choking and an inability to swallow effectively. This is a new symptom for him. He also experiences frequent burping, especially when eating, and notes that Zofran helps reduce the burping.  He has a history of constipation and manages it with Miralax, which he takes almost daily. He reports having bowel movements most days, an improvement from previously going two to three days without one. Diarrhea occurs about a third of the time, and he adjusts his Miralax intake accordingly. He also uses Linzess 145 mcg, which he finds effective.  He mentions a significant episode of pain in the upper abdomen, described as feeling like angina, lasting about three hours after eating. This pain is different from his usual angina and is located below the breastbone, near the xiphoid process. He has a known history of gallstones.  He recalls a past episode of bright red blood, which he attributes to a hemorrhoid, and he has hydrocortisone cream available for use as needed.       Clinical history includes MGUS, A-fib on Eliquis, s/p AVR, CAD, CHF, history of renal artery stenosis on Pletal   CT Angio Abdomen Pelvis w/wo contrast 12-13-22 -VASCULAR Atherosclerosis of abdominal aorta is noted without aneurysm or dissection. No significant mesenteric or renal artery stenosis is noted. Aortic  Atherosclerosis (ICD10-I70.0). -NON-VASCULAR Diverticulosis of descending and sigmoid colon is noted. Minimal inflammatory changes are noted involving the proximal sigmoid colon which were present on prior exam and may represent chronic sequela of previous inflammation, although acute inflammation cannot be excluded.   Stable moderate prostatic enlargement.   CT abdomen pelvis November 05, 2022 without contrast 1. Minimal stranding in the proximal sigmoid colon compatible with acute diverticulitis. No free air or free fluid is present. 2. Cholelithiasis without evidence of cholecystitis. 3. Stable 5 cm simple cyst at the lower pole of the right kidney. No follow-up imaging is recommended. JACR 2018 Feb; 264-273, Management of the Incidental Renal Mass on CT, [employer] 2021; 814-848, Bosniak Classification of Cystic Renal Masses, Version 2019. 4. Cardiomegaly without failure. 5. Coronary artery disease. 6.  Aortic Atherosclerosis (ICD10-I70.0).   CT abdomen pelvis without contrast September 30, 2022 1. There is mild bowel wall thickening and faint adjacent fat stranding at the confluence of the sigmoid colon and descending colon. This could reflect early acute uncomplicated diverticulitis. 2. Bladder wall is circumferentially thickened with a trabeculated appearance. This could reflect sequela of chronic outlet obstruction but given faint adjacent fat stranding, recommend correlation with urinalysis to exclude superimposed infection. 3. Massive prostatomegaly.   CT Abdomen Pelvis w contrast 08-12-21  1. No acute CT findings of the abdomen or pelvis to explain lower abdominal pain. 2. Descending and sigmoid diverticulosis without evidence of acute diverticulitis. 3. Severe prostatomegaly. 4. Tiny gallstones and or sludge in the gallbladder. No evidence of acute cholecystitis. 5. Coronary artery disease.  CT Abdomen Pelvis wo contrast 10-18-19 -Mild to moderate acute sigmoid  diverticulitis. No evidence of abscess or other complication. -Stable enlarged prostate and findings of chronic bladder outlet obstruction.  -Cholelithiasis. No radiographic evidence of cholecystitis.   Outpatient Encounter Medications as of 05/24/2023  Medication Sig   amLODipine (NORVASC) 5 MG tablet Take 1 tablet (5 mg total) by mouth daily.   apixaban (ELIQUIS) 5 MG TABS tablet Take 1 tablet (5 mg total) by mouth 2 (two) times daily.   butalbital-acetaminophen-caffeine (BAC) 50-325-40 MG tablet Take 1 tablet by mouth 3 (three) times daily as needed.   butalbital-acetaminophen-caffeine (BAC) 50-325-40 MG tablet Take 1 tablet by mouth three times a day as needed pt aware of acetaminophen in Norco as well   cholecalciferol (VITAMIN D3) 25 MCG (1000 UNIT) tablet Take 1,000 Units by mouth daily.   cilostazol (PLETAL) 100 MG tablet Take 1 tablet (100 mg total) by mouth 2 (two) times daily.   Coenzyme Q10 200 MG TABS Take 200 mg by mouth daily.   desoximetasone (TOPICORT) 0.25 % cream Apply 1 Application topically 2 (two) times daily.   diclofenac Sodium (VOLTAREN) 1 % GEL Apply 2 g topically 2 (two) times daily as needed (knee pain).   digoxin (LANOXIN) 0.125 MG tablet Take 1 tablet (0.125 mg total) by mouth daily.   doxazosin (CARDURA) 4 MG tablet Take 1 tablet (4 mg total) by mouth daily. (Patient taking differently: Take 4 mg by mouth at bedtime.)   ezetimibe (ZETIA) 10 MG tablet Take 1 tablet (10 mg total) by mouth daily. (Patient taking differently: Take 10 mg by mouth at bedtime.)   furosemide (LASIX) 20 MG tablet Take 20 mg by mouth daily as needed for fluid.   hydrocortisone (ANUSOL-HC) 25 MG suppository Place 1 suppository (25 mg total) rectally at bedtime as needed for hemorrhoids or anal itching.   linaclotide (LINZESS) 145 MCG CAPS capsule Take 1 capsule (145 mcg total) by mouth daily before breakfast.   magnesium oxide (MAG-OX) 400 MG tablet Take 1 tablet (400 mg total) by mouth  daily.   metoprolol succinate (TOPROL XL) 25 MG 24 hr tablet Take 3 tablets (75 mg total) by mouth 2 (two) times daily. (Patient taking differently: Take 75 mg by mouth 2 (two) times daily. Patient taking 2 tablets twice a day)   Multiple Vitamins-Minerals (MULTIVITAMIN WITH MINERALS) tablet Take 1 tablet by mouth daily.   naloxegol oxalate (MOVANTIK) 25 MG TABS tablet Take 1 tablet (25 mg total) by mouth at bedtime.   nitroGLYCERIN (NITROSTAT) 0.4 MG SL tablet Place 1 tablet (0.4 mg total) under the tongue every 5 (five) minutes as needed for chest pain.   Omeprazole-Sodium Bicarbonate (ZEGERID PO) Take 1 capsule by mouth as needed (acid reflux).   pantoprazole (PROTONIX) 40 MG tablet Take 1 tablet (40 mg total) by mouth daily.   pneumococcal 20-valent conjugate vaccine (PREVNAR 20) 0.5 ML injection Inject into the muscle.   Polyethyl Glycol-Propyl Glycol (SYSTANE OP) Place 1 drop into both eyes daily as needed (dry eyes).   polyethylene glycol (MIRALAX / GLYCOLAX) 17 g packet Take 17 g by mouth daily. Patient taking 1-2 everyday   potassium chloride SA (KLOR-CON M) 20 MEQ tablet Take 1 tablet (20 mEq total) by mouth daily.   predniSONE (DELTASONE) 5 MG tablet Take 1 tablet (5 mg total) by mouth daily with breakfast.   rosuvastatin (CRESTOR) 20 MG tablet Take 1 tablet (20 mg total) by mouth at bedtime. Please keep scheduled appointment for  future refills.   sennosides-docusate sodium (SENOKOT-S) 8.6-50 MG tablet Take 1 tablet by mouth 2 (two) times daily.   telmisartan (MICARDIS) 20 MG tablet Take 1 tablet (20 mg total) by mouth daily.   [DISCONTINUED] HYDROcodone-acetaminophen (NORCO) 10-325 MG tablet Take 1 tablet by mouth 3 (three) times daily as needed   [DISCONTINUED] LORazepam (ATIVAN) 1 MG tablet Take 1 tablet (1 mg) by mouth at bedtime, and 0.5 tablet (0.5 mg) daily as needed for anxiety.   amLODipine (NORVASC) 2.5 MG tablet Take 0.5 tablets (1.25 mg total) by mouth daily. (Patient not  taking: Reported on 05/24/2023)   [DISCONTINUED] pregabalin (LYRICA) 75 MG capsule Take 75 mg by mouth 2 (two) times daily.   No facility-administered encounter medications on file as of 05/24/2023.    Allergies as of 05/24/2023 - Review Complete 04/22/2023  Allergen Reaction Noted   Contrast media [iodinated contrast media] Hives 02/01/2011   Gadolinium derivatives Hives 10/09/2012   Iodine-131 Hives 10/07/2021   Metrizamide Hives 02/01/2011   Tetanus toxoids  10/07/2021    Past Medical History:  Diagnosis Date   Aortic stenosis    moderate aortic stenosis   Arthritis    Benign prostatic hypertrophy    Chronic systolic heart failure (HCC)    Chronotropic incompetence with sinus node dysfunction    Status post Guidant dual-mode, dual-pacing, dual-sensing  pacemaker   implantation now programmed to AAI with recent generator change.   Coronary artery disease    status post multiple prior percutaneous coronary interventions, microvascular angina per Dr [name]   Diverticulitis sigmoid colon recurrent    Dysfunctional autonomic nervous system    Dyspnea    Heart murmur    History of primary hypertension    Hypercoagulable state (HCC)    chronically anticoagulated with coumadin   Hyperlipidemia    Hyperthyroidism    Hypothyroidism    Dr. [name]   MGUS (monoclonal gammopathy of unknown significance) 02/17/2013   Ocular myasthenia (HCC)    Osteoarthritis    Paroxysmal atrial fibrillation (HCC)    DR. Riley Kill,    Prediabetes 09/21/2017   Stroke (HCC)    1990    Past Surgical History:  Procedure Laterality Date   AORTIC VALVE REPLACEMENT  03/15/2011   Procedure: AORTIC VALVE REPLACEMENT (AVR);  Surgeon: Alleen Borne, [name];  Location: [facility] OR;  Service: Open Heart Surgery;  Laterality: N/A;   APPENDECTOMY     CARDIAC CATHETERIZATION     13   CARDIOVERSION     CARDIOVERSION  04/15/2011   Procedure: CARDIOVERSION;  Surgeon: Marca Ancona, [name];  Location: [facility] ENDOSCOPY;   Service: Cardiovascular;  Laterality: N/A;   CARDIOVERSION N/A 09/11/2014   Procedure: CARDIOVERSION;  Surgeon: Quintella Reichert, [name];  Location: [facility] ENDOSCOPY;  Service: Cardiovascular;  Laterality: N/A;   CARDIOVERSION N/A 06/27/2015   Procedure: CARDIOVERSION;  Surgeon: Vesta Mixer, [name];  Location: [facility] ENDOSCOPY;  Service: Cardiovascular;  Laterality: N/A;   CARDIOVERSION N/A 07/04/2015   Procedure: CARDIOVERSION;  Surgeon: Marinus Maw, [name];  Location: [facility] ENDOSCOPY;  Service: Cardiovascular;  Laterality: N/A;   CARDIOVERSION N/A 04/13/2017   Procedure: CARDIOVERSION;  Surgeon: Quintella Reichert, [name];  Location: [facility] ENDOSCOPY;  Service: Cardiovascular;  Laterality: N/A;   COLONOSCOPY     CORONARY STENT INTERVENTION N/A 10/15/2019   Procedure: CORONARY STENT INTERVENTION;  Surgeon: Corky Crafts, [name];  Location: [facility] INVASIVE CV LAB;  Service: Cardiovascular;  Laterality: N/A;   EP IMPLANTABLE DEVICE N/A 06/16/2015   Procedure: Pacemaker Implant;  Surgeon: Marinus Maw, [name];  Location: [facility] INVASIVE CV LAB;  Service: Cardiovascular;  Laterality: N/A;   hemrrhoidectomy     LEFT AND RIGHT HEART CATHETERIZATION WITH CORONARY ANGIOGRAM Bilateral 02/01/2011   Procedure: LEFT AND RIGHT HEART CATHETERIZATION WITH CORONARY ANGIOGRAM;  Surgeon: Herby Abraham, [name];  Location: [facility] CATH LAB;  Service: Cardiovascular;  Laterality: Bilateral;   LEFT HEART CATH AND CORONARY ANGIOGRAPHY N/A 10/15/2019   Procedure: LEFT HEART CATH AND CORONARY ANGIOGRAPHY;  Surgeon: Corky Crafts, [name];  Location: [facility] INVASIVE CV LAB;  Service: Cardiovascular;  Laterality: N/A;   LEFT HEART CATH AND CORONARY ANGIOGRAPHY N/A 10/07/2020   Procedure: LEFT HEART CATH AND CORONARY ANGIOGRAPHY;  Surgeon: Tonny Bollman, [name];  Location: [facility] INVASIVE CV LAB;  Service: Cardiovascular;  Laterality: N/A;   LEFT HEART CATH AND CORONARY ANGIOGRAPHY N/A 07/06/2022   Procedure: LEFT HEART CATH AND CORONARY ANGIOGRAPHY;  Surgeon: Tonny Bollman, [name];  Location: [facility] INVASIVE CV LAB;  Service: Cardiovascular;  Laterality: N/A;   LUMBAR LAMINECTOMY/DECOMPRESSION MICRODISCECTOMY Right 02/23/2021   Procedure: Right Lumbar four-five microdiscectomy;  Surgeon: Tia Alert, [name];  Location: [facility] OR;  Service: Neurosurgery;  Laterality: Right;   MAZE  03/15/2011   Procedure: MAZE;  Surgeon: Alleen Borne, [name];  Location: [facility] OR;  Service: Open Heart Surgery;  Laterality: N/A;   MOHS SURGERY  09/2021   PACEMAKER INSERTION  1991   Guidant PPM, most recent Generator Change by Dr [name] was 08/22/06   RIGHT/LEFT HEART CATH AND CORONARY ANGIOGRAPHY N/A 07/06/2016   Procedure: Right/Left Heart Cath and Coronary Angiography;  Surgeon: Tonny Bollman, [name];  Location: [facility] INVASIVE CV LAB;  Service: Cardiovascular;  Laterality: N/A;   TEE WITHOUT CARDIOVERSION  04/15/2011   Procedure: TRANSESOPHAGEAL ECHOCARDIOGRAM (TEE);  Surgeon: Marca Ancona, [name];  Location: [facility] ENDOSCOPY;  Service: Cardiovascular;  Laterality: N/A;   TEE WITHOUT CARDIOVERSION N/A 09/11/2014   Procedure: TRANSESOPHAGEAL ECHOCARDIOGRAM (TEE);  Surgeon: Quintella Reichert, [name];  Location: [facility] ENDOSCOPY;  Service: Cardiovascular;  Laterality: N/A;    Family History  Problem Relation Age of Onset   Heart disease Brother        Twin brother has coronary disease and recent AVR for AS   CAD Brother    Atrial fibrillation Brother    Sarcoidosis Brother    Depression Daughter    Anorexia nervosa Daughter    Hypertension Son    Anesthesia problems Neg Hx    Hypotension Neg Hx    Malignant hyperthermia Neg Hx    Pseudochol deficiency Neg Hx     Social History   Socioeconomic History   Marital status: Married    Spouse name: Dondra Spry    Number of children: 3   Years of education: Not on file   Highest education level: Not on file  Occupational History   Occupation: Retired    Comment: Physician  Tobacco Use   Smoking status: Never   Smokeless tobacco: Never  Vaping Use   Vaping  status: Never Used  Substance and Sexual Activity   Alcohol use: No    Alcohol/week: 0.0 standard drinks of alcohol   Drug use: No   Sexual activity: Not Currently  Other Topics Concern   Not on file  Social History Narrative   Married to [place]. Has grown children   Retired Proofreader [name]      Never smoker no alcohol      Social Drivers of Corporate investment banker Strain: Not on file  Food Insecurity: No Food Insecurity (01/08/2023)   Hunger Vital Sign    Worried About Running Out of Food in the Last Year: Never true    Ran Out of Food in the Last Year: Never true  Transportation Needs: No Transportation Needs (01/08/2023)   PRAPARE - Administrator, Civil Service (Medical): No    Lack of Transportation (Non-Medical): No  Physical Activity: Not on file  Stress: Not on file  Social Connections: Not on file  Intimate Partner Violence: Not At Risk (01/08/2023)   Humiliation, Afraid, Rape, and Kick questionnaire    Fear of Current or Ex-Partner: No    Emotionally Abused: No    Physically Abused: No    Sexually Abused: No      Review of systems: All other review of systems negative except as mentioned in the HPI.   Physical Exam: Vitals:   05/24/23 1059  BP: 130/70  Pulse: 62   Body mass index is 28.07 kg/m. Gen:      No acute distress HEENT:  sclera anicteric CV: s1s2 rrr Lungs: B/l clear. Abd:      soft, non-tender; no palpable masses, no distension Ext:    No edema Neuro: alert and oriented x 3 Psych: normal mood and affect  Data Reviewed:  Reviewed labs, radiology imaging, old records and pertinent past GI work up     Assessment and Plan    Constipation Chronic constipation managed with Miralax and Linzess. Bowel movements have improved to daily from every 2-3 days. Abdominal tenderness in the left lower quadrant has decreased by 50%. Diarrhea occurs about a third of the time, likely due to Miralax dosing. The goal is to maintain regular  bowel movements without constipation to prevent complications. - Continue Miralax daily, adjust dose to half on days with diarrhea - Avoid stopping Miralax completely to prevent constipation - Continue Linzess 145 mcg as it is effective  Dysphagia New onset dysphagia, particularly when lying down, with associated burping and discomfort. Differential includes esophageal motility disorder or reflux-related issues. A barium swallow study is planned to assess for reflux or motility issues. Protonix is continued to manage symptoms. - Order barium swallow study to assess for reflux or motility issues - Continue Protonix once daily  Epigastric Pain Intermittent epigastric pain postprandially, distinct from angina. Pain located below the xiphoid process. Differential includes gallstones or acid reflux. An ultrasound is planned to evaluate gallstones, and a follow-up upper endoscopy will be considered if the barium swallow indicates significant findings. - Order right upper quadrant ultrasound to evaluate gallstones - Consider follow-up upper endoscopy if barium swallow indicates significant findings  Hemorrhoids Intermittent bright red blood per rectum, likely due to hemorrhoids, especially with episodes of diarrhea. Hydrocortisone suppositories are prescribed for management as needed. - Prescribe hydrocortisone suppositories for hemorrhoid management as needed  Follow-up A CBC and CMP will be performed to monitor blood parameters, especially in light of recent changes in blood pressure medication. - Perform CBC and CMP to monitor blood parameters - Schedule follow-up appointment after completion of tests  Recording duration: 30 minutes          This visit required >30 minutes of patient care (this includes precharting, chart review, review of results, face-to-face time used for counseling as well as treatment plan and follow-up. The patient was provided an opportunity to ask questions and all  were answered. The patient agreed with the plan and demonstrated an understanding of the instructions.  Iona Beard ,  [name]    CC: Mahlon Gammon, [name]

## 2023-05-24 NOTE — Telephone Encounter (Signed)
 Patient has request refill on medication Lorazepam. Patient medication last refilled 11/10/2022. Patient has Non opioid agreement on file dated 02/03/2023. Patient medication pend and sent to PCP Mahlon Gammon, MD for approval.

## 2023-05-25 ENCOUNTER — Other Ambulatory Visit (HOSPITAL_BASED_OUTPATIENT_CLINIC_OR_DEPARTMENT_OTHER): Payer: Self-pay

## 2023-05-25 ENCOUNTER — Other Ambulatory Visit: Payer: Self-pay

## 2023-05-25 MED ORDER — LORAZEPAM 1 MG PO TABS
1.0000 mg | ORAL_TABLET | Freq: Two times a day (BID) | ORAL | 5 refills | Status: DC | PRN
  Filled 2023-05-25 (×2): qty 60, 30d supply, fill #0
  Filled 2023-06-21 – 2023-06-22 (×3): qty 60, 30d supply, fill #1
  Filled 2023-07-20: qty 60, 30d supply, fill #2
  Filled 2023-10-21 – 2023-10-24 (×3): qty 60, 30d supply, fill #3
  Filled 2023-11-17: qty 60, 30d supply, fill #4

## 2023-05-27 ENCOUNTER — Ambulatory Visit: Payer: Medicare Other | Admitting: Gastroenterology

## 2023-05-27 ENCOUNTER — Ambulatory Visit (HOSPITAL_BASED_OUTPATIENT_CLINIC_OR_DEPARTMENT_OTHER)
Admission: RE | Admit: 2023-05-27 | Discharge: 2023-05-27 | Disposition: A | Discharge status: Home / Self Care | Source: Ambulatory Visit | Attending: Gastroenterology | Admitting: Gastroenterology

## 2023-05-27 DIAGNOSIS — R1319 Other dysphagia: Secondary | ICD-10-CM | POA: Insufficient documentation

## 2023-05-27 DIAGNOSIS — R1013 Epigastric pain: Secondary | ICD-10-CM | POA: Diagnosis not present

## 2023-05-27 DIAGNOSIS — R1011 Right upper quadrant pain: Secondary | ICD-10-CM | POA: Diagnosis not present

## 2023-05-27 DIAGNOSIS — K219 Gastro-esophageal reflux disease without esophagitis: Secondary | ICD-10-CM | POA: Diagnosis not present

## 2023-05-27 DIAGNOSIS — K8020 Calculus of gallbladder without cholecystitis without obstruction: Secondary | ICD-10-CM | POA: Diagnosis not present

## 2023-05-30 ENCOUNTER — Encounter: Payer: Self-pay | Admitting: Gastroenterology

## 2023-05-31 ENCOUNTER — Encounter: Payer: Self-pay | Admitting: Gastroenterology

## 2023-06-01 ENCOUNTER — Other Ambulatory Visit (HOSPITAL_BASED_OUTPATIENT_CLINIC_OR_DEPARTMENT_OTHER): Payer: Self-pay

## 2023-06-01 DIAGNOSIS — G894 Chronic pain syndrome: Secondary | ICD-10-CM | POA: Diagnosis not present

## 2023-06-01 DIAGNOSIS — M48062 Spinal stenosis, lumbar region with neurogenic claudication: Secondary | ICD-10-CM | POA: Diagnosis not present

## 2023-06-01 DIAGNOSIS — M47812 Spondylosis without myelopathy or radiculopathy, cervical region: Secondary | ICD-10-CM | POA: Diagnosis not present

## 2023-06-01 DIAGNOSIS — Z79891 Long term (current) use of opiate analgesic: Secondary | ICD-10-CM | POA: Diagnosis not present

## 2023-06-01 MED ORDER — HYDROCODONE-ACETAMINOPHEN 10-325 MG PO TABS
1.0000 | ORAL_TABLET | Freq: Three times a day (TID) | ORAL | 0 refills | Status: DC
  Filled 2023-06-01 – 2023-06-21 (×2): qty 90, 30d supply, fill #0

## 2023-06-01 MED ORDER — BUTALBITAL-APAP-CAFFEINE 50-325-40 MG PO TABS
1.0000 | ORAL_TABLET | Freq: Three times a day (TID) | ORAL | 2 refills | Status: DC | PRN
  Filled 2023-06-01 – 2023-07-22 (×3): qty 90, 30d supply, fill #0

## 2023-06-08 ENCOUNTER — Other Ambulatory Visit: Payer: Self-pay | Admitting: Gastroenterology

## 2023-06-08 ENCOUNTER — Ambulatory Visit (INDEPENDENT_AMBULATORY_CARE_PROVIDER_SITE_OTHER): Payer: Medicare Other

## 2023-06-08 ENCOUNTER — Ambulatory Visit (HOSPITAL_COMMUNITY)
Admission: RE | Admit: 2023-06-08 | Discharge: 2023-06-08 | Disposition: A | Discharge status: Home / Self Care | Source: Ambulatory Visit | Attending: Gastroenterology | Admitting: Gastroenterology

## 2023-06-08 DIAGNOSIS — K219 Gastro-esophageal reflux disease without esophagitis: Secondary | ICD-10-CM | POA: Diagnosis not present

## 2023-06-08 DIAGNOSIS — R1013 Epigastric pain: Secondary | ICD-10-CM | POA: Insufficient documentation

## 2023-06-08 DIAGNOSIS — R1319 Other dysphagia: Secondary | ICD-10-CM

## 2023-06-08 DIAGNOSIS — I495 Sick sinus syndrome: Secondary | ICD-10-CM

## 2023-06-08 DIAGNOSIS — R1310 Dysphagia, unspecified: Secondary | ICD-10-CM | POA: Diagnosis not present

## 2023-06-08 DIAGNOSIS — K224 Dyskinesia of esophagus: Secondary | ICD-10-CM | POA: Diagnosis not present

## 2023-06-08 LAB — CUP PACEART REMOTE DEVICE CHECK
Battery Remaining Longevity: 36 mo
Battery Remaining Percentage: 36 %
Brady Statistic RA Percent Paced: 1 %
Brady Statistic RV Percent Paced: 72 %
Date Time Interrogation Session: 20250409022300
Implantable Lead Connection Status: 753985
Implantable Lead Connection Status: 753985
Implantable Lead Implant Date: 20170417
Implantable Lead Implant Date: 20170417
Implantable Lead Location: 753859
Implantable Lead Location: 753860
Implantable Lead Model: 7740
Implantable Lead Model: 7741
Implantable Lead Serial Number: 662696
Implantable Lead Serial Number: 751382
Implantable Pulse Generator Implant Date: 20170417
Lead Channel Impedance Value: 664 Ohm
Lead Channel Impedance Value: 719 Ohm
Lead Channel Setting Pacing Amplitude: 2.5 V
Lead Channel Setting Pacing Amplitude: 2.5 V
Lead Channel Setting Pacing Pulse Width: 0.4 ms
Lead Channel Setting Sensing Sensitivity: 2.5 mV
Pulse Gen Serial Number: 718418
Zone Setting Status: 755011

## 2023-06-09 DIAGNOSIS — D485 Neoplasm of uncertain behavior of skin: Secondary | ICD-10-CM | POA: Diagnosis not present

## 2023-06-09 DIAGNOSIS — L570 Actinic keratosis: Secondary | ICD-10-CM | POA: Diagnosis not present

## 2023-06-09 DIAGNOSIS — L578 Other skin changes due to chronic exposure to nonionizing radiation: Secondary | ICD-10-CM | POA: Diagnosis not present

## 2023-06-09 DIAGNOSIS — Z85820 Personal history of malignant melanoma of skin: Secondary | ICD-10-CM | POA: Diagnosis not present

## 2023-06-09 DIAGNOSIS — Z85828 Personal history of other malignant neoplasm of skin: Secondary | ICD-10-CM | POA: Diagnosis not present

## 2023-06-09 DIAGNOSIS — C44329 Squamous cell carcinoma of skin of other parts of face: Secondary | ICD-10-CM | POA: Diagnosis not present

## 2023-06-09 DIAGNOSIS — L821 Other seborrheic keratosis: Secondary | ICD-10-CM | POA: Diagnosis not present

## 2023-06-09 DIAGNOSIS — D225 Melanocytic nevi of trunk: Secondary | ICD-10-CM | POA: Diagnosis not present

## 2023-06-10 ENCOUNTER — Encounter: Payer: Self-pay | Admitting: Cardiovascular Disease

## 2023-06-10 ENCOUNTER — Other Ambulatory Visit (HOSPITAL_BASED_OUTPATIENT_CLINIC_OR_DEPARTMENT_OTHER): Payer: Self-pay

## 2023-06-10 ENCOUNTER — Ambulatory Visit: Attending: Cardiology | Admitting: Cardiovascular Disease

## 2023-06-10 VITALS — BP 120/88 | HR 60 | Ht 72.5 in | Wt 207.6 lb

## 2023-06-10 DIAGNOSIS — I25119 Atherosclerotic heart disease of native coronary artery with unspecified angina pectoris: Secondary | ICD-10-CM | POA: Insufficient documentation

## 2023-06-10 DIAGNOSIS — I10 Essential (primary) hypertension: Secondary | ICD-10-CM | POA: Insufficient documentation

## 2023-06-10 DIAGNOSIS — I4819 Other persistent atrial fibrillation: Secondary | ICD-10-CM | POA: Diagnosis not present

## 2023-06-10 DIAGNOSIS — I5032 Chronic diastolic (congestive) heart failure: Secondary | ICD-10-CM | POA: Diagnosis not present

## 2023-06-10 DIAGNOSIS — Z952 Presence of prosthetic heart valve: Secondary | ICD-10-CM | POA: Insufficient documentation

## 2023-06-10 NOTE — Assessment & Plan Note (Signed)
 Blood pressure much better controlled on combination of amlodipine, metoprolol, and Micardis.

## 2023-06-10 NOTE — Patient Instructions (Addendum)
 Medication Instructions:  Your physician recommends that you continue on your current medications as directed. Please refer to the Current Medication list given to you today.  *If you need a refill on your cardiac medications before your next appointment, please call your pharmacy*  Follow-Up: At [facility], you and your health needs are our priority.  As part of our continuing mission to provide you with exceptional heart care, we have created designated Provider Care Teams.  These Care Teams include your primary Cardiologist (physician) and Advanced Practice Providers (APPs -  Physician Assistants and Nurse Practitioners) who all work together to provide you with the care you need, when you need it.  Your next appointment:   6 month(s)  The format for your next appointment:   In Person  Provider:   Tonny Bollman, MD{  Other Instructions   1st Floor: - Lobby - Registration  - Pharmacy  - Lab - Cafe  2nd Floor: - PV Lab - Diagnostic Testing (echo, CT, nuclear med)  3rd Floor: - Vacant  4th Floor: - TCTS (cardiothoracic surgery) - AFib Clinic - Structural Heart Clinic - Vascular Surgery  - Vascular Ultrasound  5th Floor: - [facility] Card[facility] (general and EP) - Clinical Pharmacy for coumadin, hypertension, lipid, weight-loss medications, and med management appointments    Valet parking services will be available as well.

## 2023-06-10 NOTE — Assessment & Plan Note (Signed)
 Tolerating apixaban without bleeding problems.  EKG today shows underlying atrial fibrillation with a paced rhythm.  Clinically stable on digoxin and metoprolol.

## 2023-06-10 NOTE — Progress Notes (Signed)
 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   Here with his nurse, Selena Batten.  He is doing well.  He called in last month and was having a lot of problems with severely elevated blood pressures complicated by headaches.  We talked over the phone and I made some adjustments to his antihypertensive regimen.  At that time he was started on amlodipine 5 mg daily and Micardis 20 mg daily.  He takes an extra amlodipine 2.5 mg as needed, but he has not required this recently.  His blood pressures are now much better controlled.  He has had this pattern where he will have severely elevated blood pressure for short period of time and then things settle down again.  He continues to have anginal chest discomfort after eating.  This is chronic and unchanged.  He has had multiple heart catheterizations and has not been found to have any severe obstructive disease.  He denies orthopnea or PND.  Chronic dyspnea with exertion is unchanged and stable.  Recent echocardiogram shows normal LVEF and normal function of his bioprosthetic aortic valve.  Current Medications: Current Meds  Medication Sig   amLODipine (NORVASC) 2.5 MG tablet Take 2.5 mg by mouth as needed.   amLODipine (NORVASC) 5 MG tablet Take 1 tablet (5 mg total) by mouth daily.   apixaban (ELIQUIS) 5 MG TABS tablet Take 1 tablet (5 mg total) by mouth 2 (two) times daily.    butalbital-acetaminophen-caffeine (BAC) 50-325-40 MG tablet Take 1 tablet by mouth three times a day as needed pt aware of acetaminophen in Norco as well   butalbital-acetaminophen-caffeine (BAC) 50-325-40 MG tablet Take 1 tablet by mouth 3 (three) times daily as needed.   cholecalciferol (VITAMIN D3) 25 MCG (1000 UNIT) tablet Take 1,000 Units by mouth daily.   cilostazol (PLETAL) 100 MG tablet Take 1 tablet (100 mg total) by mouth 2 (two) times daily.   Coenzyme Q10 200 MG TABS Take 200 mg by mouth daily.   desoximetasone (TOPICORT) 0.25 % cream Apply 1 Application topically 2 (two) times daily.   diclofenac Sodium (VOLTAREN) 1 % GEL Apply 2 g topically 2 (two) times daily as needed (knee pain).   digoxin (LANOXIN) 0.125 MG tablet Take 1 tablet (0.125 mg total) by mouth daily.   doxazosin (CARDURA) 4 MG tablet Take 1 tablet (4 mg total) by mouth daily. (Patient taking differently: Take 4 mg by mouth at bedtime.)   ezetimibe (ZETIA) 10 MG tablet Take 1 tablet (10 mg total) by mouth daily. (Patient taking differently: Take 10 mg by mouth at bedtime.)   furosemide (LASIX) 20 MG tablet Take 20 mg by mouth daily as needed for fluid.   HYDROcodone-acetaminophen (NORCO) 10-325 MG tablet Take 1 tablet by mouth 3 (three) times daily as directed (DX: G89.4)   hydrocortisone (ANUSOL-HC) 25 MG suppository Place 1 suppository (25 mg total) rectally at bedtime as needed  for hemorrhoids or anal itching.   linaclotide (LINZESS) 145 MCG CAPS capsule Take 1 capsule (145 mcg total) by mouth daily before breakfast.   LORazepam (ATIVAN) 1 MG tablet Take 1 tablet (1 mg) by mouth at bedtime, and 0.5 tablet (0.5 mg) daily as needed for anxiety.   magnesium oxide (MAG-OX) 400 MG tablet Take 1 tablet (400 mg total) by mouth daily.   metoprolol succinate (TOPROL XL) 25 MG 24 hr tablet Take 3 tablets (75 mg total) by mouth 2 (two) times daily. (Patient taking differently: Take 75 mg by mouth 2 (two) times daily. Patient taking  2 tablets twice a day)   Multiple Vitamins-Minerals (MULTIVITAMIN WITH MINERALS) tablet Take 1 tablet by mouth daily.   naloxegol oxalate (MOVANTIK) 25 MG TABS tablet Take 1 tablet (25 mg total) by mouth at bedtime.   nitroGLYCERIN (NITROSTAT) 0.4 MG SL tablet Place 1 tablet (0.4 mg total) under the tongue every 5 (five) minutes as needed for chest pain.   Omeprazole-Sodium Bicarbonate (ZEGERID PO) Take 1 capsule by mouth as needed (acid reflux).   pantoprazole (PROTONIX) 40 MG tablet Take 1 tablet (40 mg total) by mouth daily.   pneumococcal 20-valent conjugate vaccine (PREVNAR 20) 0.5 ML injection Inject into the muscle.   Polyethyl Glycol-Propyl Glycol (SYSTANE OP) Place 1 drop into both eyes daily as needed (dry eyes).   polyethylene glycol (MIRALAX / GLYCOLAX) 17 g packet Take 17 g by mouth daily. Patient taking 1-2 everyday   potassium chloride SA (KLOR-CON M) 20 MEQ tablet Take 1 tablet (20 mEq total) by mouth daily.   predniSONE (DELTASONE) 5 MG tablet Take 1 tablet (5 mg total) by mouth daily with breakfast.   rosuvastatin (CRESTOR) 20 MG tablet Take 1 tablet (20 mg total) by mouth at bedtime. Please keep scheduled appointment for future refills.   sennosides-docusate sodium (SENOKOT-S) 8.6-50 MG tablet Take 1 tablet by mouth 2 (two) times daily.   telmisartan (MICARDIS) 20 MG tablet Take 1 tablet (20 mg total) by mouth daily.     Allergies:   Contrast media [iodinated contrast media], Gadolinium derivatives, Iodine-131, Metrizamide, and Tetanus toxoids   ROS:   Please see the history of present illness.    All other systems reviewed and are negative.  EKGs/Labs/Other Studies Reviewed:    The following studies were reviewed today: Cardiac Studies & Procedures   ______________________________________________________________________________________________ CARDIAC CATHETERIZATION  CARDIAC CATHETERIZATION 07/06/2022  Narrative   Prox RCA lesion is 30% stenosed.   Mid RCA lesion  is 40% stenosed.   Mid Cx to Dist Cx lesion is 20% stenosed.   Ost LAD to Prox LAD lesion is 40% stenosed.   Non-stenotic Dist RCA lesion was previously treated.   LV end diastolic pressure is normal.  Widely patent left main with no stenosis Nonobstructive proximal LAD stenosis unchanged from prior cath studies Widely patent LCx stent with no significant stenosis in the LCx distribution Patent RCA with mild nonobstructive disease and patent distal RCA stent Normal LVEDP  Recommend: continue antianginal Rx and medical treatment of nonobstructive CAD. Resume apixaban tomorrow morning.  Findings Coronary Findings Diagnostic  Dominance: Right  Left Main Vessel is angiographically normal. Patent left main with mild calcification and no stenosis. Large vessel. Divides into the LAD, LCx, and intermediate/high OM branch.  Left Anterior Descending Vessel is large. There is mild diffuse disease throughout the vessel. Patent vessel, mild-moderate diffuse proximal plaquing with no high-grade stenosis, unchanged from the prior cath study. Ost LAD to Prox LAD  lesion is 40% stenosed.  Left Circumflex The circumflex is patent with no significant stenosis. The stent remains widely patent with mild diffuse ISR. The OM branches are patent. Mid Cx to Dist Cx lesion is 20% stenosed. The lesion was previously treated over 2 years ago. Stented segment in the LCx remains patent with no significant stenosis  Right Coronary Artery There is mild diffuse disease throughout the vessel. Large, dominant vessel. There are diffuse irregularities with mild stenoses in the proximal and mid vessel. The distal vessel is widely patent. The PDA and PLA branches are patent. The stented segment in the distal RCA is patent with no stenosis. The PDA supplies a large territory of the inferior wall. There is no significant change in direct comparison to prior cath studies. Prox RCA lesion is 30% stenosed. The lesion is  calcified. Mid RCA lesion is 40% stenosed. The lesion is calcified. No change from prior study Non-stenotic Dist RCA lesion was previously treated. Stent remains widely patent with no restenosis  Intervention  No interventions have been documented.   CARDIAC CATHETERIZATION  CARDIAC CATHETERIZATION 10/07/2020  Narrative   Prox RCA lesion is 30% stenosed.   Mid RCA lesion is 40% stenosed.   Prox LAD to Mid LAD lesion is 40% stenosed.   Previously placed Mid Cx to Dist Cx stent (unknown type) is  widely patent.   Non-stenotic Dist RCA lesion was previously treated.  Patent left main with no stenosis Patent LAD with mild-moderate proximal stenosis unchanged from previous cardiac cath studies Patent LCx stent with no significant stenosis Patent RCA with mild diffuse plaquing and patent stent in the distal RCA Normal LV filling pressure and no transaortic gradient across bioprosthetic aortic valve  Recommend: medical therapy.  Note the patient has frequent runs of PAT throughout the procedure with HR above 160 bpm and drop in blood pressure down to the 70's during these runs  Findings Coronary Findings Diagnostic  Dominance: Right  Left Main Vessel is angiographically normal. Patent left main with mild calcification and no stenosis.  Left Anterior Descending Prox LAD to Mid LAD lesion is 40% stenosed. Diffuse calcific disease in the proximal LAD is present. This is mild-moderate in severity and unchanged from prior cath studies. Stenosis estimated at 40-50%.  Left Circumflex Previously placed Mid Cx to Dist Cx stent (unknown type) is  widely patent. Stented segment in the LCx remains patent with no significant stenosis  Right Coronary Artery There is mild diffuse disease throughout the vessel. Large, dominant vessel. There are diffuse irregularities with mild stenoses in the proximal and mid vessel. The distal vessel is widely patent. The PDA and PLA branches are patent. The  stented segment in the distal RCA is patent with no stenosis. Prox RCA lesion is 30% stenosed. The lesion is calcified. Mid RCA lesion is 40% stenosed. The lesion is calcified. No change from prior study Non-stenotic Dist RCA lesion was previously treated. Stent remains widely patent with no restenosis  Intervention  No interventions have been documented.   STRESS TESTS  MYOCARDIAL PERFUSION IMAGING 02/13/2019  Narrative  Nuclear stress EF: 48%. Visually, the EF appears to be normal. Echo from today shows normal EF of 55-60%.  There was no ST segment deviation noted during stress.  This is a low risk study. There is no evidence of ischemia or previous infarction.  The study is normal.   ECHOCARDIOGRAM  ECHOCARDIOGRAM COMPLETE 05/12/2023  Narrative ECHOCARDIOGRAM REPORT    Patient Name:   [name]  Date of Exam: 05/12/2023 Medical Rec #:  [id number]        Height:       72.0 in Accession #:    [id number]       Weight:       207.6 lb Date of Birth:  1934-08-28         BSA:          2.165 m Patient Age:    88 years         BP:           142/90 mmHg Patient Gender: M                HR:           60 bpm. Exam Location:  [address]  Procedure: 2D Echo (Both Spectral and Color Flow Doppler were utilized during procedure).  Indications:    I48.1 Persistent atrial fibrillation  History:        Patient has prior history of Echocardiogram examinations, most recent 04/06/2022. CHF, CAD, Pacemaker, Stroke, Arrythmias:Atrial Flutter and PVC, Signs/Symptoms:Fatigue; Risk Factors:Hypertension and Dyslipidemia. Aortic stenosis. S/P AVR (25mm Magna Ease). Orthostatic hypotension. Atrial tachycardia. Chronic angina. Prediabetes. Aortic Valve: 25 mm bioprosthetic valve is present in the aortic position. Procedure Date: 03/15/2011.  Sonographer:    Cathie Beams RCS Referring Phys: [phone] Shalunda Lindh  IMPRESSIONS   1. Left ventricular ejection fraction, by estimation, is  55 to 60%. The left ventricle has normal function. The left ventricle has no regional wall motion abnormalities. Left ventricular diastolic function could not be evaluated. 2. Right ventricular systolic function is mildly reduced. The right ventricular size is mildly enlarged. There is normal pulmonary artery systolic pressure. The estimated right ventricular systolic pressure is 33.4 mmHg. 3. Left atrial size was severely dilated. 4. Right atrial size was moderately dilated. 5. The mitral valve is degenerative. Mild mitral valve regurgitation. No evidence of mitral stenosis. Moderate mitral annular calcification. 6. The aortic valve has been repaired/replaced. Aortic valve regurgitation is not visualized. There is a 25 mm bioprosthetic valve present in the aortic position. Procedure Date: 03/15/2011. Echo findings are consistent with normal structure and function of the aortic valve prosthesis. Aortic valve area, by VTI measures 3.09 cm. Aortic valve mean gradient measures 9.0 mmHg. Aortic valve Vmax measures 2.06 m/s. 7. There is moderate dilatation of the ascending aorta, measuring 44 mm. 8. The inferior vena cava is dilated in size with >50% respiratory variability, suggesting right atrial pressure of 8 mmHg.  Comparison(s): No significant change from prior study.  FINDINGS Left Ventricle: Left ventricular ejection fraction, by estimation, is 55 to 60%. The left ventricle has normal function. The left ventricle has no regional wall motion abnormalities. The left ventricular internal cavity size was normal in size. There is no left ventricular hypertrophy. Abnormal (paradoxical) septal motion consistent with post-operative status. Left ventricular diastolic function could not be evaluated due to atrial fibrillation. Left ventricular diastolic function could not be evaluated.  Right Ventricle: The right ventricular size is mildly enlarged. No increase in right ventricular wall thickness. Right  ventricular systolic function is mildly reduced. There is normal pulmonary artery systolic pressure. The tricuspid regurgitant velocity is 2.52 m/s, and with an assumed right atrial pressure of 8 mmHg, the estimated right ventricular systolic pressure is 33.4 mmHg.  Left Atrium: Left atrial size was severely dilated.  Right Atrium: Right atrial size was moderately dilated.  Pericardium: There is no evidence of pericardial effusion.  Mitral Valve: The  mitral valve is degenerative in appearance. Moderate mitral annular calcification. Mild mitral valve regurgitation. No evidence of mitral valve stenosis.  Tricuspid Valve: The tricuspid valve is grossly normal. Tricuspid valve regurgitation is mild . No evidence of tricuspid stenosis.  Aortic Valve: The aortic valve has been repaired/replaced. Aortic valve regurgitation is not visualized. Aortic valve mean gradient measures 9.0 mmHg. Aortic valve peak gradient measures 17.0 mmHg. Aortic valve area, by VTI measures 3.09 cm. There is a 25 mm bioprosthetic valve present in the aortic position. Procedure Date: 03/15/2011. Echo findings are consistent with normal structure and function of the aortic valve prosthesis.  Pulmonic Valve: The pulmonic valve was grossly normal. Pulmonic valve regurgitation is trivial. No evidence of pulmonic stenosis.  Aorta: The aortic root is normal in size and structure. There is moderate dilatation of the ascending aorta, measuring 44 mm.  Venous: The inferior vena cava is dilated in size with greater than 50% respiratory variability, suggesting right atrial pressure of 8 mmHg.  IAS/Shunts: The atrial septum is grossly normal.  Additional Comments: A device lead is visualized in the right ventricle.   LEFT VENTRICLE PLAX 2D LVIDd:         5.80 cm LVIDs:         3.70 cm LV PW:         1.10 cm LV IVS:        1.10 cm LVOT diam:     2.34 cm LV SV:         119 LV SV Index:   55 LVOT Area:     4.30 cm   RIGHT  VENTRICLE RV S prime:     11.73 cm/s TAPSE (M-mode): 2.0 cm RVSP:           40.4 mmHg  LEFT ATRIUM              Index        RIGHT ATRIUM            Index LA diam:        6.20 cm  2.86 cm/m   RA Pressure: 15.00 mmHg LA Vol (A2C):   132.0 ml 60.98 ml/m  RA Area:     29.80 cm LA Vol (A4C):   110.0 ml 50.82 ml/m  RA Volume:   103.00 ml  47.58 ml/m LA Biplane Vol: 125.0 ml 57.75 ml/m AORTIC VALVE AV Area (Vmax):    2.71 cm AV Area (Vmean):   2.64 cm AV Area (VTI):     3.09 cm AV Vmax:           206.00 cm/s AV Vmean:          139.000 cm/s AV VTI:            0.385 m AV Peak Grad:      17.0 mmHg AV Mean Grad:      9.0 mmHg LVOT Vmax:         130.00 cm/s LVOT Vmean:        85.200 cm/s LVOT VTI:          0.277 m LVOT/AV VTI ratio: 0.72  AORTA Ao Asc diam: 4.40 cm  TRICUSPID VALVE TR Peak grad:   25.4 mmHg TR Vmax:        252.00 cm/s Estimated RAP:  15.00 mmHg RVSP:           40.4 mmHg  SHUNTS Systemic VTI:  0.28 m Systemic Diam: 2.34 cm  Lennie Odor MD Electronically signed by Gerri Spore  O'Neal MD Signature Date/Time: 05/12/2023/5:11:07 PM    Final    MONITORS  LONG TERM MONITOR (3-14 DAYS) 11/11/2020  Narrative 1. Atrial flutter with a CVR 2. Rare PVC's and NSVT 3. No prolonged pauses 4. Brief episodes of RVR with an ave HR of 63/min and briefly up to 130/min.   Gregg Taylor,MD  Patch Wear Time:  2 days and 23 hours (2022-09-09T16:34:38-0400 to 2022-09-12T15:40:33-0400)  1 run of Ventricular Tachycardia occurred lasting 11 beats with a max rate of 169 bpm (avg 153 bpm). Atrial Flutter occurred continuously (100% burden), ranging from 58-130 bpm (avg of 63 bpm). Ventricular Pacing was present. Isolated VEs were occasional (2.4%, 6112), VE Couplets were occasional (1.1%, 1473), and VE Triplets were rare (<1.0%, 2). Ventricular Bigeminy and Trigeminy were present.        ______________________________________________________________________________________________      EKG:   EKG Interpretation Date/Time:  Friday June 10 2023 10:02:29 EDT Ventricular Rate:  60 PR Interval:    QRS Duration:  188 QT Interval:  486 QTC Calculation: 486 R Axis:   266  Text Interpretation: Ventricular-paced rhythm When compared with ECG of 22-Apr-2023 11:04, Vent. rate has decreased BY   7 BPM Confirmed by Tonny Bollman [phone]) on 06/10/2023 10:17:47 AM    Recent Labs: 08/27/2022: TSH 4.506 05/24/2023: ALT 12; BUN 14; Creatinine, Ser 1.03; Hemoglobin 11.7; Platelets 201.0; Potassium 4.2; Sodium 139  Recent Lipid Panel    Component Value Date/Time   CHOL 131 04/13/2021 0109   CHOL 158 09/02/2017 1006   TRIG 27 04/13/2021 0109   HDL 62 04/13/2021 0109   HDL 72 09/02/2017 1006   CHOLHDL 2.1 04/13/2021 0109   VLDL 5 04/13/2021 0109   LDLCALC 64 04/13/2021 0109   LDLCALC 68 09/02/2017 1006   LDLDIRECT 117.9 11/16/2010 1355     Risk Assessment/Calculations:    CHA2DS2-VASc Score = 5   This indicates a 7.2% annual risk of stroke. The patient's score is based upon: CHF History: 1 HTN History: 1 Diabetes History: 0 Stroke History: 0 Vascular Disease History: 1 Age Score: 2 Gender Score: 0               Physical Exam:    VS:  BP 120/88   Pulse 60   Ht 6' 0.5" (1.842 m)   Wt 207 lb 9.6 oz (94.2 kg)   SpO2 95%   BMI 27.77 kg/m     Wt Readings from Last 3 Encounters:  06/10/23 207 lb 9.6 oz (94.2 kg)  05/24/23 207 lb (93.9 kg)  04/22/23 207 lb 9.6 oz (94.2 kg)     GEN:  Well nourished, well developed in no acute distress HEENT: Normal NECK: No JVD; No carotid bruits LYMPHATICS: No lymphadenopathy CARDIAC: RRR, 2/6 systolic ejection murmur at the right upper sternal border RESPIRATORY:  Clear to auscultation without rales, wheezing or rhonchi  ABDOMEN: Soft, non-tender, non-distended MUSCULOSKELETAL: Trace bilateral ankle edema; No  deformity  SKIN: Warm and dry NEUROLOGIC:  Alert and oriented x 3 PSYCHIATRIC:  Normal affect   Assessment & Plan Coronary artery disease involving native coronary artery of native heart with angina pectoris [facility]) Patient continues to have angina, likely with a significant component of microvascular dysfunction.  He will continue on amlodipine and metoprolol.  He is not on antiplatelet therapy because of chronic oral anticoagulation.  No changes are made. Persistent atrial fibrillation (HCC) Tolerating apixaban without bleeding problems.  EKG today shows underlying atrial fibrillation with a paced rhythm.  Clinically stable  on digoxin and metoprolol. Essential hypertension Blood pressure much better controlled on combination of amlodipine, metoprolol, and Micardis. S/P AVR (aortic valve replacement) Recent echo reviewed, normal bioprosthetic aortic valve function noted. Chronic diastolic heart failure (HCC) Stable on current medical therapy.  He only takes furosemide as needed.  Blood pressure better controlled as above.  Continue current management.      Medication Adjustments/Labs and Tests Ordered: Current medicines are reviewed at length with the patient today.  Concerns regarding medicines are outlined above.  Orders Placed This Encounter  Procedures   EKG 12-Lead   No orders of the defined types were placed in this encounter.   Patient Instructions  Medication Instructions:  Your physician recommends that you continue on your current medications as directed. Please refer to the Current Medication list given to you today.  *If you need a refill on your cardiac medications before your next appointment, please call your pharmacy*  Follow-Up: At [facility], you and your health needs are our priority.  As part of our continuing mission to provide you with exceptional heart care, we have created designated Provider Care Teams.  These Care Teams include your primary Cardiologist  (physician) and Advanced Practice Providers (APPs -  Physician Assistants and Nurse Practitioners) who all work together to provide you with the care you need, when you need it.  Your next appointment:   6 month(s)  The format for your next appointment:   In Person  Provider:   Tonny Bollman, MD{  Other Instructions   1st Floor: - Lobby - Registration  - Pharmacy  - Lab - Cafe  2nd Floor: - PV Lab - Diagnostic Testing (echo, CT, nuclear med)  3rd Floor: - Vacant  4th Floor: - TCTS (cardiothoracic surgery) - AFib Clinic - Structural Heart Clinic - Vascular Surgery  - Vascular Ultrasound  5th Floor: - HeartCare Cardiology (general and EP) - Clinical Pharmacy for coumadin, hypertension, lipid, weight-loss medications, and med management appointments    Valet parking services will be available as well.     Signed, Tonny Bollman, MD  06/10/2023 10:44 AM    [facility]

## 2023-06-10 NOTE — Assessment & Plan Note (Signed)
 Recent echo reviewed, normal bioprosthetic aortic valve function noted.

## 2023-06-14 ENCOUNTER — Encounter: Payer: Self-pay | Admitting: Internal Medicine

## 2023-06-15 ENCOUNTER — Other Ambulatory Visit (HOSPITAL_BASED_OUTPATIENT_CLINIC_OR_DEPARTMENT_OTHER): Payer: Self-pay

## 2023-06-17 ENCOUNTER — Other Ambulatory Visit (HOSPITAL_BASED_OUTPATIENT_CLINIC_OR_DEPARTMENT_OTHER): Payer: Self-pay

## 2023-06-21 ENCOUNTER — Other Ambulatory Visit (HOSPITAL_BASED_OUTPATIENT_CLINIC_OR_DEPARTMENT_OTHER): Payer: Self-pay

## 2023-06-21 ENCOUNTER — Other Ambulatory Visit: Payer: Self-pay

## 2023-06-22 ENCOUNTER — Other Ambulatory Visit (HOSPITAL_BASED_OUTPATIENT_CLINIC_OR_DEPARTMENT_OTHER): Payer: Self-pay

## 2023-07-01 ENCOUNTER — Other Ambulatory Visit (HOSPITAL_BASED_OUTPATIENT_CLINIC_OR_DEPARTMENT_OTHER): Payer: Self-pay

## 2023-07-01 ENCOUNTER — Other Ambulatory Visit: Payer: Self-pay | Admitting: Cardiovascular Disease

## 2023-07-01 MED ORDER — EZETIMIBE 10 MG PO TABS
10.0000 mg | ORAL_TABLET | Freq: Every day | ORAL | 3 refills | Status: AC
  Filled 2023-07-01: qty 90, 90d supply, fill #0
  Filled 2023-09-15: qty 90, 90d supply, fill #1
  Filled 2023-12-12: qty 90, 90d supply, fill #2
  Filled 2024-03-21: qty 90, 90d supply, fill #3

## 2023-07-01 MED ORDER — ROSUVASTATIN CALCIUM 20 MG PO TABS
20.0000 mg | ORAL_TABLET | Freq: Every day | ORAL | 3 refills | Status: AC
  Filled 2023-07-01: qty 90, 90d supply, fill #0
  Filled 2023-09-15: qty 90, 90d supply, fill #1
  Filled 2023-12-12: qty 90, 90d supply, fill #2
  Filled 2024-03-21: qty 90, 90d supply, fill #3

## 2023-07-02 ENCOUNTER — Other Ambulatory Visit (HOSPITAL_BASED_OUTPATIENT_CLINIC_OR_DEPARTMENT_OTHER): Payer: Self-pay

## 2023-07-05 DIAGNOSIS — C44329 Squamous cell carcinoma of skin of other parts of face: Secondary | ICD-10-CM | POA: Diagnosis not present

## 2023-07-20 ENCOUNTER — Other Ambulatory Visit (HOSPITAL_BASED_OUTPATIENT_CLINIC_OR_DEPARTMENT_OTHER): Payer: Self-pay

## 2023-07-20 ENCOUNTER — Other Ambulatory Visit: Payer: Self-pay

## 2023-07-20 DIAGNOSIS — M48062 Spinal stenosis, lumbar region with neurogenic claudication: Secondary | ICD-10-CM | POA: Diagnosis not present

## 2023-07-20 DIAGNOSIS — G894 Chronic pain syndrome: Secondary | ICD-10-CM | POA: Diagnosis not present

## 2023-07-20 DIAGNOSIS — Z79891 Long term (current) use of opiate analgesic: Secondary | ICD-10-CM | POA: Diagnosis not present

## 2023-07-20 DIAGNOSIS — M47812 Spondylosis without myelopathy or radiculopathy, cervical region: Secondary | ICD-10-CM | POA: Diagnosis not present

## 2023-07-20 MED ORDER — HYDROCODONE-ACETAMINOPHEN 10-325 MG PO TABS
ORAL_TABLET | ORAL | 0 refills | Status: DC
Start: 2023-07-20 — End: 2023-10-18
  Filled 2023-07-20: qty 90, 30d supply, fill #0

## 2023-07-22 ENCOUNTER — Other Ambulatory Visit (HOSPITAL_BASED_OUTPATIENT_CLINIC_OR_DEPARTMENT_OTHER): Payer: Self-pay

## 2023-07-22 NOTE — Progress Notes (Signed)
 Remote pacemaker transmission.

## 2023-07-28 ENCOUNTER — Other Ambulatory Visit (HOSPITAL_BASED_OUTPATIENT_CLINIC_OR_DEPARTMENT_OTHER): Payer: Self-pay

## 2023-07-28 DIAGNOSIS — T8189XA Other complications of procedures, not elsewhere classified, initial encounter: Secondary | ICD-10-CM | POA: Diagnosis not present

## 2023-07-29 ENCOUNTER — Other Ambulatory Visit: Payer: Self-pay

## 2023-07-29 ENCOUNTER — Other Ambulatory Visit (HOSPITAL_BASED_OUTPATIENT_CLINIC_OR_DEPARTMENT_OTHER): Payer: Self-pay

## 2023-08-01 DIAGNOSIS — T8189XD Other complications of procedures, not elsewhere classified, subsequent encounter: Secondary | ICD-10-CM | POA: Diagnosis not present

## 2023-08-02 ENCOUNTER — Non-Acute Institutional Stay: Payer: Medicare Other | Admitting: Internal Medicine

## 2023-08-02 ENCOUNTER — Encounter: Payer: Self-pay | Admitting: Internal Medicine

## 2023-08-02 ENCOUNTER — Other Ambulatory Visit: Payer: Self-pay | Admitting: Cardiovascular Disease

## 2023-08-02 ENCOUNTER — Other Ambulatory Visit (HOSPITAL_BASED_OUTPATIENT_CLINIC_OR_DEPARTMENT_OTHER): Payer: Self-pay

## 2023-08-02 VITALS — BP 150/82 | HR 75 | Temp 98.1°F | Ht 72.6 in

## 2023-08-02 DIAGNOSIS — E785 Hyperlipidemia, unspecified: Secondary | ICD-10-CM | POA: Diagnosis not present

## 2023-08-02 DIAGNOSIS — I25118 Atherosclerotic heart disease of native coronary artery with other forms of angina pectoris: Secondary | ICD-10-CM | POA: Diagnosis not present

## 2023-08-02 DIAGNOSIS — H34231 Retinal artery branch occlusion, right eye: Secondary | ICD-10-CM

## 2023-08-02 DIAGNOSIS — M8580 Other specified disorders of bone density and structure, unspecified site: Secondary | ICD-10-CM | POA: Diagnosis not present

## 2023-08-02 DIAGNOSIS — Z7952 Long term (current) use of systemic steroids: Secondary | ICD-10-CM

## 2023-08-02 DIAGNOSIS — N401 Enlarged prostate with lower urinary tract symptoms: Secondary | ICD-10-CM | POA: Diagnosis not present

## 2023-08-02 DIAGNOSIS — E1159 Type 2 diabetes mellitus with other circulatory complications: Secondary | ICD-10-CM

## 2023-08-02 DIAGNOSIS — G894 Chronic pain syndrome: Secondary | ICD-10-CM | POA: Diagnosis not present

## 2023-08-02 DIAGNOSIS — D472 Monoclonal gammopathy: Secondary | ICD-10-CM

## 2023-08-02 DIAGNOSIS — I4819 Other persistent atrial fibrillation: Secondary | ICD-10-CM

## 2023-08-02 DIAGNOSIS — I10 Essential (primary) hypertension: Secondary | ICD-10-CM

## 2023-08-02 DIAGNOSIS — M158 Other polyosteoarthritis: Secondary | ICD-10-CM

## 2023-08-02 DIAGNOSIS — K5903 Drug induced constipation: Secondary | ICD-10-CM

## 2023-08-02 MED ORDER — POTASSIUM CHLORIDE CRYS ER 20 MEQ PO TBCR
20.0000 meq | EXTENDED_RELEASE_TABLET | Freq: Every day | ORAL | 3 refills | Status: AC
  Filled 2023-08-02 – 2023-10-15 (×2): qty 90, 90d supply, fill #0
  Filled 2023-12-12: qty 90, 90d supply, fill #1

## 2023-08-02 NOTE — Progress Notes (Unsigned)
 Location:  [facility] features editor of Service:  Clinic (12)  Provider:   Code Status: *** Goals of Care:     02/01/2023   10:08 AM  Advanced Directives  Does Patient Have a Medical Advance Directive? Yes  Type of Advance Directive Out of facility DNR (pink MOST or yellow form)  Does patient want to make changes to medical advance directive? No - Patient declined     Chief Complaint  Patient presents with   Follow-up    6 month follow up    HPI: Patient is a 89 y.o. male seen today for medical management of chronic diseases.     Past Medical History:  Diagnosis Date   Aortic stenosis    moderate aortic stenosis   Arthritis    Benign prostatic hypertrophy    Chronic systolic heart failure (HCC)    Chronotropic incompetence with sinus node dysfunction    Status post Guidant dual-mode, dual-pacing, dual-sensing  pacemaker   implantation now programmed to AAI with recent generator change.   Coronary artery disease    status post multiple prior percutaneous coronary interventions, microvascular angina per Dr Grandville Lax   Diverticulitis sigmoid colon recurrent    Dysfunctional autonomic nervous system    Dyspnea    Heart murmur    History of primary hypertension    Hypercoagulable state (HCC)    chronically anticoagulated with coumadin    Hyperlipidemia    Hyperthyroidism    Hypothyroidism    Dr. Christobal Craft   MGUS (monoclonal gammopathy of unknown significance) 02/17/2013   Ocular myasthenia (HCC)    Osteoarthritis    Paroxysmal atrial fibrillation (HCC)    DR. Judy Null,    Prediabetes 09/21/2017   Stroke (HCC)    1990    Past Surgical History:  Procedure Laterality Date   AORTIC VALVE REPLACEMENT  03/15/2011   Procedure: AORTIC VALVE REPLACEMENT (AVR);  Surgeon: Bartley Lightning, MD;  Location: [facility] OR;  Service: Open Heart Surgery;  Laterality: N/A;   APPENDECTOMY     CARDIAC CATHETERIZATION     13   CARDIOVERSION     CARDIOVERSION  04/15/2011    Procedure: CARDIOVERSION;  Surgeon: Peder Bourdon, MD;  Location: [facility] ENDOSCOPY;  Service: Cardiovascular;  Laterality: N/A;   CARDIOVERSION N/A 09/11/2014   Procedure: CARDIOVERSION;  Surgeon: Jacqueline Matsu, MD;  Location: [facility] ENDOSCOPY;  Service: Cardiovascular;  Laterality: N/A;   CARDIOVERSION N/A 06/27/2015   Procedure: CARDIOVERSION;  Surgeon: Lake Pilgrim, MD;  Location: [facility] ENDOSCOPY;  Service: Cardiovascular;  Laterality: N/A;   CARDIOVERSION N/A 07/04/2015   Procedure: CARDIOVERSION;  Surgeon: Tammie Fall, MD;  Location: [facility] ENDOSCOPY;  Service: Cardiovascular;  Laterality: N/A;   CARDIOVERSION N/A 04/13/2017   Procedure: CARDIOVERSION;  Surgeon: Jacqueline Matsu, MD;  Location: [facility] ENDOSCOPY;  Service: Cardiovascular;  Laterality: N/A;   COLONOSCOPY     CORONARY STENT INTERVENTION N/A 10/15/2019   Procedure: CORONARY STENT INTERVENTION;  Surgeon: Lucendia Rusk, MD;  Location: [facility] INVASIVE CV LAB;  Service: Cardiovascular;  Laterality: N/A;   EP IMPLANTABLE DEVICE N/A 06/16/2015   Procedure: Pacemaker Implant;  Surgeon: Tammie Fall, MD;  Location: [facility] INVASIVE CV LAB;  Service: Cardiovascular;  Laterality: N/A;   hemrrhoidectomy     LEFT AND RIGHT HEART CATHETERIZATION WITH CORONARY ANGIOGRAM Bilateral 02/01/2011   Procedure: LEFT AND RIGHT HEART CATHETERIZATION WITH CORONARY ANGIOGRAM;  Surgeon: Kristopher Pheasant, MD;  Location: [facility] CATH LAB;  Service: Cardiovascular;  Laterality: Bilateral;  LEFT HEART CATH AND CORONARY ANGIOGRAPHY N/A 10/15/2019   Procedure: LEFT HEART CATH AND CORONARY ANGIOGRAPHY;  Surgeon: Lucendia Rusk, MD;  Location: [facility] INVASIVE CV LAB;  Service: Cardiovascular;  Laterality: N/A;   LEFT HEART CATH AND CORONARY ANGIOGRAPHY N/A 10/07/2020   Procedure: LEFT HEART CATH AND CORONARY ANGIOGRAPHY;  Surgeon: Arnoldo Lapping, MD;  Location: [facility] INVASIVE CV LAB;  Service: Cardiovascular;  Laterality: N/A;   LEFT HEART CATH AND CORONARY ANGIOGRAPHY N/A  07/06/2022   Procedure: LEFT HEART CATH AND CORONARY ANGIOGRAPHY;  Surgeon: Arnoldo Lapping, MD;  Location: [facility] INVASIVE CV LAB;  Service: Cardiovascular;  Laterality: N/A;   LUMBAR LAMINECTOMY/DECOMPRESSION MICRODISCECTOMY Right 02/23/2021   Procedure: Right Lumbar four-five microdiscectomy;  Surgeon: Isadora Mar, MD;  Location: [facility] OR;  Service: Neurosurgery;  Laterality: Right;   MAZE  03/15/2011   Procedure: MAZE;  Surgeon: Bartley Lightning, MD;  Location: [facility] OR;  Service: Open Heart Surgery;  Laterality: N/A;   MOHS SURGERY  09/2021   PACEMAKER INSERTION  1991   Guidant PPM, most recent Generator Change by Dr Grandville Lax was 08/22/06   RIGHT/LEFT HEART CATH AND CORONARY ANGIOGRAPHY N/A 07/06/2016   Procedure: Right/Left Heart Cath and Coronary Angiography;  Surgeon: Arnoldo Lapping, MD;  Location: [facility] INVASIVE CV LAB;  Service: Cardiovascular;  Laterality: N/A;   TEE WITHOUT CARDIOVERSION  04/15/2011   Procedure: TRANSESOPHAGEAL ECHOCARDIOGRAM (TEE);  Surgeon: Peder Bourdon, MD;  Location: [facility] ENDOSCOPY;  Service: Cardiovascular;  Laterality: N/A;   TEE WITHOUT CARDIOVERSION N/A 09/11/2014   Procedure: TRANSESOPHAGEAL ECHOCARDIOGRAM (TEE);  Surgeon: Jacqueline Matsu, MD;  Location: [facility] ENDOSCOPY;  Service: Cardiovascular;  Laterality: N/A;    Allergies  Allergen Reactions   Contrast Media [Iodinated Contrast Media] Hives   Gadolinium Derivatives Hives   Iodine-131 Hives   Metrizamide Hives   Tetanus Toxoids     Redness     Outpatient Encounter Medications as of 08/02/2023  Medication Sig   amLODipine  (NORVASC ) 2.5 MG tablet Take 2.5 mg by mouth as needed.   amLODipine  (NORVASC ) 5 MG tablet Take 1 tablet (5 mg total) by mouth daily.   apixaban  (ELIQUIS ) 5 MG TABS tablet Take 1 tablet (5 mg total) by mouth 2 (two) times daily.   butalbital -acetaminophen -caffeine  (FIORICET ) 50-325-40 MG tablet Take 1 tablet by mouth three times a day as needed pt aware of acetaminophen  in Norco as well (Patient  taking differently: Take 1 tablet by mouth every 12 (twelve) hours.)   butalbital -acetaminophen -caffeine  (FIORICET ) 50-325-40 MG tablet Take 1 tablet by mouth 3 (three) times daily as needed.   cholecalciferol  (VITAMIN D3) 25 MCG (1000 UNIT) tablet Take 1,000 Units by mouth daily.   cilostazol  (PLETAL ) 100 MG tablet Take 1 tablet (100 mg total) by mouth 2 (two) times daily.   Coenzyme Q10 200 MG TABS Take 200 mg by mouth daily.   desoximetasone  (TOPICORT ) 0.25 % cream Apply 1 Application topically 2 (two) times daily.   diclofenac Sodium (VOLTAREN) 1 % GEL Apply 2 g topically 2 (two) times daily as needed (knee pain).   digoxin  (LANOXIN ) 0.125 MG tablet Take 1 tablet (0.125 mg total) by mouth daily.   doxazosin  (CARDURA ) 4 MG tablet Take 1 tablet (4 mg total) by mouth daily. (Patient taking differently: Take 4 mg by mouth at bedtime.)   ezetimibe  (ZETIA ) 10 MG tablet Take 1 tablet (10 mg total) by mouth daily.   furosemide  (LASIX ) 20 MG tablet Take 20 mg by mouth daily as needed for fluid.  HYDROcodone -acetaminophen  (NORCO) 10-325 MG tablet Take 1 tablet by mouth three times a day as directed DX G89.4 (Patient taking differently: 4 times a day)   hydrocortisone  (ANUSOL -HC) 25 MG suppository Place 1 suppository (25 mg total) rectally at bedtime as needed for hemorrhoids or anal itching.   linaclotide  (LINZESS ) 145 MCG CAPS capsule Take 1 capsule (145 mcg total) by mouth daily before breakfast.   LORazepam  (ATIVAN ) 1 MG tablet Take 1 tablet (1 mg) by mouth at bedtime, and 0.5 tablet (0.5 mg) daily as needed for anxiety.   magnesium  oxide (MAG-OX) 400 MG tablet Take 1 tablet (400 mg total) by mouth daily.   metoprolol  succinate (TOPROL  XL) 25 MG 24 hr tablet Take 3 tablets (75 mg total) by mouth 2 (two) times daily. (Patient taking differently: Take 75 mg by mouth 2 (two) times daily. Patient taking 2 tablets twice a day)   Multiple Vitamins-Minerals (MULTIVITAMIN WITH MINERALS) tablet Take 1 tablet  by mouth daily.   naloxegol  oxalate (MOVANTIK ) 25 MG TABS tablet Take 1 tablet (25 mg total) by mouth at bedtime.   nitroGLYCERIN  (NITROSTAT ) 0.4 MG SL tablet Place 1 tablet (0.4 mg total) under the tongue every 5 (five) minutes as needed for chest pain.   Omeprazole-Sodium Bicarbonate (ZEGERID PO) Take 1 capsule by mouth as needed (acid reflux).   pantoprazole  (PROTONIX ) 40 MG tablet Take 1 tablet (40 mg total) by mouth daily.   pneumococcal 20-valent conjugate vaccine (PREVNAR 20 ) 0.5 ML injection Inject into the muscle.   Polyethyl Glycol-Propyl Glycol (SYSTANE OP) Place 1 drop into both eyes daily as needed (dry eyes).   polyethylene glycol (MIRALAX  / GLYCOLAX ) 17 g packet Take 17 g by mouth daily. Patient taking 1-2 everyday   potassium chloride  SA (KLOR-CON  M) 20 MEQ tablet Take 1 tablet (20 mEq total) by mouth daily.   predniSONE  (DELTASONE ) 5 MG tablet Take 1 tablet (5 mg total) by mouth daily with breakfast.   rosuvastatin  (CRESTOR ) 20 MG tablet Take 1 tablet (20 mg total) by mouth at bedtime. Please keep scheduled appointment for future refills.   sennosides-docusate sodium  (SENOKOT-S) 8.6-50 MG tablet Take 1 tablet by mouth 2 (two) times daily.   telmisartan  (MICARDIS ) 20 MG tablet Take 1 tablet (20 mg total) by mouth daily.   [DISCONTINUED] pregabalin (LYRICA) 75 MG capsule Take 75 mg by mouth 2 (two) times daily.   No facility-administered encounter medications on file as of 08/02/2023.    Review of Systems:  Review of Systems  Health Maintenance  Topic Date Due   Medicare Annual Wellness (AWV)  Never done   COVID-19 Vaccine (4 - 2024-25 season) 08/18/2023 (Originally 10/31/2022)   Zoster Vaccines- Shingrix (1 of 2) 11/02/2023 (Originally 09/02/1953)   DTaP/Tdap/Td (2 - Td or Tdap) 08/01/2024 (Originally 10/24/2014)   INFLUENZA VACCINE  09/30/2023   Pneumonia Vaccine 31+ Years old  Completed   HPV VACCINES  Aged Out   Meningococcal B Vaccine  Aged Out    Physical Exam: Vitals:    08/02/23 1001  BP: (!) 150/82  Pulse: 75  Temp: 98.1 F (36.7 C)  SpO2: 95%  Height: 6' 0.6" (1.844 m)   Body mass index is 27.69 kg/m. Physical Exam  Labs reviewed: Basic Metabolic Panel: Recent Labs    08/27/22 1120 09/30/22 1151 01/14/23 0448 02/09/23 0852 05/24/23 1212  NA 139   < > 135 140 139  K 3.9   < > 3.5 4.0 4.2  CL 100   < > 103 101  99  CO2 29   < > 23 32 31  GLUCOSE 147*   < > 108* 136* 155*  BUN 13   < > 11 12 14   CREATININE 1.10   < > 0.86 0.88 1.03  CALCIUM  9.6   < > 8.9 9.6 9.8  TSH 4.506*  --   --   --   --    < > = values in this interval not displayed.   Liver Function Tests: Recent Labs    01/13/23 0510 02/09/23 0852 05/24/23 1212  AST 21 20 16   ALT 16 16 12   ALKPHOS 120 116 108  BILITOT 0.6 0.6 0.5  PROT 6.9 7.7 7.8  ALBUMIN  3.3* 4.2 4.3   Recent Labs    01/08/23 1240 05/24/23 1212  LIPASE 12 32.0  AMYLASE  --  38   No results for input(s): "AMMONIA" in the last 8760 hours. CBC: Recent Labs    01/11/23 0452 01/13/23 0510 01/14/23 0448 02/09/23 0852 05/24/23 1212  WBC 6.6   < > 6.4 8.0 7.7  NEUTROABS 4.2  --   --  5.8 6.2  HGB 9.9*   < > 10.4* 11.1* 11.7*  HCT 31.3*   < > 32.0* 34.4* 35.5*  MCV 92.6   < > 89.6 88.7 86.6  PLT 235   < > 223 212 201.0   < > = values in this interval not displayed.   Lipid Panel: No results for input(s): "CHOL", "HDL", "LDLCALC", "TRIG", "CHOLHDL", "LDLDIRECT" in the last 8760 hours. Lab Results  Component Value Date   HGBA1C 7.0 (H) 05/28/2022    Procedures since last visit: No results found.  Assessment/Plan There are no diagnoses linked to this encounter.   Labs/tests ordered:  * No order type specified * Next appt:  09/05/2023

## 2023-08-08 ENCOUNTER — Other Ambulatory Visit (HOSPITAL_BASED_OUTPATIENT_CLINIC_OR_DEPARTMENT_OTHER): Payer: Self-pay

## 2023-08-08 DIAGNOSIS — L089 Local infection of the skin and subcutaneous tissue, unspecified: Secondary | ICD-10-CM | POA: Diagnosis not present

## 2023-08-08 DIAGNOSIS — T148XXA Other injury of unspecified body region, initial encounter: Secondary | ICD-10-CM | POA: Diagnosis not present

## 2023-08-08 MED ORDER — CEPHALEXIN 500 MG PO CAPS
500.0000 mg | ORAL_CAPSULE | Freq: Three times a day (TID) | ORAL | 0 refills | Status: DC
  Filled 2023-08-08: qty 21, 7d supply, fill #0

## 2023-08-17 ENCOUNTER — Other Ambulatory Visit (HOSPITAL_BASED_OUTPATIENT_CLINIC_OR_DEPARTMENT_OTHER): Payer: Self-pay

## 2023-08-18 ENCOUNTER — Other Ambulatory Visit (HOSPITAL_BASED_OUTPATIENT_CLINIC_OR_DEPARTMENT_OTHER): Payer: Self-pay

## 2023-08-18 DIAGNOSIS — M47812 Spondylosis without myelopathy or radiculopathy, cervical region: Secondary | ICD-10-CM | POA: Diagnosis not present

## 2023-08-18 DIAGNOSIS — Z79891 Long term (current) use of opiate analgesic: Secondary | ICD-10-CM | POA: Diagnosis not present

## 2023-08-18 DIAGNOSIS — G894 Chronic pain syndrome: Secondary | ICD-10-CM | POA: Diagnosis not present

## 2023-08-18 DIAGNOSIS — M48062 Spinal stenosis, lumbar region with neurogenic claudication: Secondary | ICD-10-CM | POA: Diagnosis not present

## 2023-08-18 MED ORDER — BUTALBITAL-APAP-CAFFEINE 50-325-40 MG PO TABS
ORAL_TABLET | ORAL | 2 refills | Status: DC
  Filled 2023-08-18: qty 90, 30d supply, fill #0
  Filled 2023-09-15: qty 90, 30d supply, fill #1

## 2023-08-18 MED ORDER — HYDROCODONE-ACETAMINOPHEN 7.5-325 MG PO TABS
1.0000 | ORAL_TABLET | Freq: Three times a day (TID) | ORAL | 0 refills | Status: DC | PRN
  Filled 2023-08-18: qty 90, 30d supply, fill #0

## 2023-08-23 ENCOUNTER — Other Ambulatory Visit (HOSPITAL_BASED_OUTPATIENT_CLINIC_OR_DEPARTMENT_OTHER): Payer: Self-pay

## 2023-08-27 ENCOUNTER — Other Ambulatory Visit (HOSPITAL_BASED_OUTPATIENT_CLINIC_OR_DEPARTMENT_OTHER): Payer: Self-pay

## 2023-09-05 ENCOUNTER — Non-Acute Institutional Stay: Admitting: Adult Health

## 2023-09-05 ENCOUNTER — Encounter: Payer: Self-pay | Admitting: Adult Health

## 2023-09-05 VITALS — BP 124/80 | HR 59 | Temp 97.6°F | Ht 72.0 in | Wt 207.0 lb

## 2023-09-05 DIAGNOSIS — Z0000 Encounter for general adult medical examination without abnormal findings: Secondary | ICD-10-CM

## 2023-09-05 NOTE — Progress Notes (Signed)
 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   Visit Complete: In person  Patient Medicare AWV questionnaire was completed by the patient on 09/05/23; I have confirmed that all information answered by patient is correct and no changes since this date.  Cardiac Risk Factors include: advanced age (>56men, >71 women);hypertension;male gender     Objective:    Today's Vitals   09/05/23 1452 09/05/23 1508 09/05/23 1527  BP: (!) 134/90 124/80   Pulse: (!) 59    Temp: 97.6 F (36.4 C)    SpO2: 96%    Weight: 207 lb (93.9 kg)    Height: 6' (1.829 m)    PainSc:   9    Body mass index is 28.07 kg/m.     09/05/2023    3:38 PM 02/01/2023   10:08 AM 01/08/2023    6:14 PM 01/08/2023   11:15 AM 07/06/2022    6:58 AM 05/18/2022   10:13 AM 02/25/2022   12:22 PM  Advanced Directives  Does Patient Have a Medical Advance Directive? Yes Yes No No Yes Yes Yes  Type of Estate agent of [place];Living will;Out of facility DNR (pink MOST or yellow form) Out of facility DNR (pink MOST or yellow form)   Healthcare Power of [place];Living will Healthcare Power of [place];Living will;Out of facility DNR (pink MOST or yellow form) Healthcare Power of [place];Living will  Does patient want to make changes to medical advance directive?  No - Patient declined     No - Patient declined  Copy of Healthcare Power of Attorney in Chart? Yes - validated most recent copy scanned in chart (See row information)        Would patient like information on creating a medical advance directive?   No - Patient declined        Current Medications (verified) Outpatient Encounter Medications as of 09/05/2023  Medication Sig   amLODipine  (NORVASC ) 2.5 MG tablet Take 2.5 mg by mouth as needed.   apixaban  (ELIQUIS ) 5 MG TABS tablet Take 1 tablet (5 mg total) by mouth 2 (two) times  daily.   butalbital -acetaminophen -caffeine  (FIORICET ) 50-325-40 MG tablet Take 1 tablet by mouth three times a day as needed pt aware of acetaminophen  in Norco as well   cephALEXin  (KEFLEX ) 500 MG capsule Take 1 capsule (500 mg total) by mouth 3 (three) times daily.   cholecalciferol  (VITAMIN D3) 25 MCG (1000 UNIT) tablet Take 1,000 Units by mouth daily.   cilostazol  (PLETAL ) 100 MG tablet Take 1 tablet (100 mg total) by mouth 2 (two) times daily.   Coenzyme Q10 200 MG TABS Take 200 mg by mouth daily.   desoximetasone  (TOPICORT ) 0.25 % cream Apply 1 Application topically 2 (two) times daily.   diclofenac Sodium (VOLTAREN) 1 % GEL Apply 2 g topically 2 (two) times daily as needed (knee pain).   digoxin  (LANOXIN ) 0.125 MG tablet Take 1 tablet (0.125 mg total) by mouth daily.   doxazosin  (CARDURA ) 4 MG tablet Take 1 tablet (4 mg total) by mouth daily.   ezetimibe  (ZETIA ) 10 MG tablet Take 1 tablet (10 mg total) by mouth daily.   furosemide  (LASIX ) 20 MG tablet Take 20 mg by mouth daily as needed for fluid.   HYDROcodone -acetaminophen  (NORCO) 7.5-325 MG tablet Take 1 tablet by mouth 3 (three) times daily as needed. Stop hydrocodone /apap 10/325   hydrocortisone  (ANUSOL -HC) 25 MG suppository Place  1 suppository (25 mg total) rectally at bedtime as needed for hemorrhoids or anal itching.   linaclotide  (LINZESS ) 145 MCG CAPS capsule Take 1 capsule (145 mcg total) by mouth daily before breakfast.   LORazepam  (ATIVAN ) 1 MG tablet Take 1 tablet (1 mg) by mouth at bedtime, and 0.5 tablet (0.5 mg) daily as needed for anxiety.   magnesium  oxide (MAG-OX) 400 MG tablet Take 1 tablet (400 mg total) by mouth daily.   metoprolol  succinate (TOPROL  XL) 25 MG 24 hr tablet Take 3 tablets (75 mg total) by mouth 2 (two) times daily.   Multiple Vitamins-Minerals (MULTIVITAMIN WITH MINERALS) tablet Take 1 tablet by mouth daily.   nitroGLYCERIN  (NITROSTAT ) 0.4 MG SL tablet Place 1 tablet (0.4 mg total) under the tongue every  5 (five) minutes as needed for chest pain.   Omeprazole-Sodium Bicarbonate (ZEGERID PO) Take 1 capsule by mouth as needed (acid reflux).   pantoprazole  (PROTONIX ) 40 MG tablet Take 1 tablet (40 mg total) by mouth daily.   pneumococcal 20-valent conjugate vaccine (PREVNAR 20 ) 0.5 ML injection Inject into the muscle.   Polyethyl Glycol-Propyl Glycol (SYSTANE OP) Place 1 drop into both eyes daily as needed (dry eyes).   polyethylene glycol (MIRALAX  / GLYCOLAX ) 17 g packet Take 17 g by mouth daily. Patient taking 1-2 everyday   potassium chloride  SA (KLOR-CON  M) 20 MEQ tablet Take 1 tablet (20 mEq total) by mouth daily.   predniSONE  (DELTASONE ) 5 MG tablet Take 1 tablet (5 mg total) by mouth daily with breakfast.   rosuvastatin  (CRESTOR ) 20 MG tablet Take 1 tablet (20 mg total) by mouth at bedtime. Please keep scheduled appointment for future refills.   sennosides-docusate sodium  (SENOKOT-S) 8.6-50 MG tablet Take 1 tablet by mouth 2 (two) times daily.   telmisartan  (MICARDIS ) 20 MG tablet Take 1 tablet (20 mg total) by mouth daily.   amLODipine  (NORVASC ) 5 MG tablet Take 1 tablet (5 mg total) by mouth daily. (Patient not taking: Reported on 09/05/2023)   butalbital -acetaminophen -caffeine  (BAC, BUTALBITAL -ACETAMIN-CAFF,) 50-325-40 MG tablet Take 1 tablet by mouth three times a day as needed pt aware of acetaminophen  in Norco as well (Patient not taking: Reported on 09/05/2023)   HYDROcodone -acetaminophen  (NORCO) 10-325 MG tablet Take 1 tablet by mouth three times a day as directed DX G89.4 (Patient not taking: Reported on 09/05/2023)   [DISCONTINUED] pregabalin (LYRICA) 75 MG capsule Take 75 mg by mouth 2 (two) times daily.   No facility-administered encounter medications on file as of 09/05/2023.    Allergies (verified) Contrast media [iodinated contrast media], Gadolinium derivatives, Iodine-131, Metrizamide, and Tetanus toxoids   History: Past Medical History:  Diagnosis Date   Aortic stenosis     moderate aortic stenosis   Arthritis    Benign prostatic hypertrophy    Chronic systolic heart failure (HCC)    Chronotropic incompetence with sinus node dysfunction    Status post Guidant dual-mode, dual-pacing, dual-sensing  pacemaker   implantation now programmed to AAI with recent generator change.   Coronary artery disease    status post multiple prior percutaneous coronary interventions, microvascular angina per Dr Obie   Diverticulitis sigmoid colon recurrent    Dysfunctional autonomic nervous system    Dyspnea    Heart murmur    History of primary hypertension    Hypercoagulable state (HCC)    chronically anticoagulated with coumadin    Hyperlipidemia    Hyperthyroidism    Hypothyroidism    Dr. Mirna   MGUS (monoclonal gammopathy of unknown significance) 02/17/2013  Ocular myasthenia (HCC)    Osteoarthritis    Paroxysmal atrial fibrillation (HCC)    DR. Morris,    Prediabetes 09/21/2017   Stroke (HCC)    1990   Past Surgical History:  Procedure Laterality Date   AORTIC VALVE REPLACEMENT  03/15/2011   Procedure: AORTIC VALVE REPLACEMENT (AVR);  Surgeon: Dorise MARLA Fellers, MD;  Location: [facility] OR;  Service: Open Heart Surgery;  Laterality: N/A;   APPENDECTOMY     CARDIAC CATHETERIZATION     13   CARDIOVERSION     CARDIOVERSION  04/15/2011   Procedure: CARDIOVERSION;  Surgeon: Ezra Shuck, MD;  Location: [facility] ENDOSCOPY;  Service: Cardiovascular;  Laterality: N/A;   CARDIOVERSION N/A 09/11/2014   Procedure: CARDIOVERSION;  Surgeon: Wilbert JONELLE Bihari, MD;  Location: [facility] ENDOSCOPY;  Service: Cardiovascular;  Laterality: N/A;   CARDIOVERSION N/A 06/27/2015   Procedure: CARDIOVERSION;  Surgeon: Aleene JINNY Passe, MD;  Location: [facility] ENDOSCOPY;  Service: Cardiovascular;  Laterality: N/A;   CARDIOVERSION N/A 07/04/2015   Procedure: CARDIOVERSION;  Surgeon: Danelle LELON Birmingham, MD;  Location: [facility] ENDOSCOPY;  Service: Cardiovascular;  Laterality: N/A;   CARDIOVERSION N/A 04/13/2017    Procedure: CARDIOVERSION;  Surgeon: Bihari Wilbert JONELLE, MD;  Location: [facility] ENDOSCOPY;  Service: Cardiovascular;  Laterality: N/A;   COLONOSCOPY     CORONARY STENT INTERVENTION N/A 10/15/2019   Procedure: CORONARY STENT INTERVENTION;  Surgeon: Dann Candyce RAMAN, MD;  Location: [facility] INVASIVE CV LAB;  Service: Cardiovascular;  Laterality: N/A;   EP IMPLANTABLE DEVICE N/A 06/16/2015   Procedure: Pacemaker Implant;  Surgeon: Danelle LELON Birmingham, MD;  Location: [facility] INVASIVE CV LAB;  Service: Cardiovascular;  Laterality: N/A;   hemrrhoidectomy     LEFT AND RIGHT HEART CATHETERIZATION WITH CORONARY ANGIOGRAM Bilateral 02/01/2011   Procedure: LEFT AND RIGHT HEART CATHETERIZATION WITH CORONARY ANGIOGRAM;  Surgeon: Debby JONETTA Morris, MD;  Location: [facility] CATH LAB;  Service: Cardiovascular;  Laterality: Bilateral;   LEFT HEART CATH AND CORONARY ANGIOGRAPHY N/A 10/15/2019   Procedure: LEFT HEART CATH AND CORONARY ANGIOGRAPHY;  Surgeon: Dann Candyce RAMAN, MD;  Location: [facility] INVASIVE CV LAB;  Service: Cardiovascular;  Laterality: N/A;   LEFT HEART CATH AND CORONARY ANGIOGRAPHY N/A 10/07/2020   Procedure: LEFT HEART CATH AND CORONARY ANGIOGRAPHY;  Surgeon: Wonda Sharper, MD;  Location: [facility] INVASIVE CV LAB;  Service: Cardiovascular;  Laterality: N/A;   LEFT HEART CATH AND CORONARY ANGIOGRAPHY N/A 07/06/2022   Procedure: LEFT HEART CATH AND CORONARY ANGIOGRAPHY;  Surgeon: Wonda Sharper, MD;  Location: [facility] INVASIVE CV LAB;  Service: Cardiovascular;  Laterality: N/A;   LUMBAR LAMINECTOMY/DECOMPRESSION MICRODISCECTOMY Right 02/23/2021   Procedure: Right Lumbar four-five microdiscectomy;  Surgeon: Joshua Alm RAMAN, MD;  Location: [facility] OR;  Service: Neurosurgery;  Laterality: Right;   MAZE  03/15/2011   Procedure: MAZE;  Surgeon: Dorise MARLA Fellers, MD;  Location: [facility] OR;  Service: Open Heart Surgery;  Laterality: N/A;   MOHS SURGERY  09/2021   PACEMAKER INSERTION  1991   Guidant PPM, most recent Generator Change by Dr Obie was 08/22/06    RIGHT/LEFT HEART CATH AND CORONARY ANGIOGRAPHY N/A 07/06/2016   Procedure: Right/Left Heart Cath and Coronary Angiography;  Surgeon: Wonda Sharper, MD;  Location: [facility] INVASIVE CV LAB;  Service: Cardiovascular;  Laterality: N/A;   TEE WITHOUT CARDIOVERSION  04/15/2011   Procedure: TRANSESOPHAGEAL ECHOCARDIOGRAM (TEE);  Surgeon: Ezra Shuck, MD;  Location: [facility] ENDOSCOPY;  Service: Cardiovascular;  Laterality: N/A;   TEE WITHOUT CARDIOVERSION N/A 09/11/2014   Procedure: TRANSESOPHAGEAL ECHOCARDIOGRAM (TEE);  Surgeon: Wilbert  JONELLE Bihari, MD;  Location: [facility] ENDOSCOPY;  Service: Cardiovascular;  Laterality: N/A;   Family History  Problem Relation Age of Onset   Heart disease Brother        Twin brother has coronary disease and recent AVR for AS   CAD Brother    Atrial fibrillation Brother    Sarcoidosis Brother    Depression Daughter    Anorexia nervosa Daughter    Hypertension Son    Anesthesia problems Neg Hx    Hypotension Neg Hx    Malignant hyperthermia Neg Hx    Pseudochol deficiency Neg Hx    Social History   Socioeconomic History   Marital status: Married    Spouse name: Alisa    Number of children: 3   Years of education: Not on file   Highest education level: Not on file  Occupational History   Occupation: Retired    Comment: Physician  Tobacco Use   Smoking status: Never   Smokeless tobacco: Never  Vaping Use   Vaping status: Never Used  Substance and Sexual Activity   Alcohol use: No    Alcohol/week: 0.0 standard drinks of alcohol   Drug use: No   Sexual activity: Not Currently  Other Topics Concern   Not on file  Social History Narrative   Married to [place]. Has grown children   Retired Proofreader MD      Never smoker no alcohol      Social Drivers of Corporate investment banker Strain: Not on file  Food Insecurity: No Food Insecurity (01/08/2023)   Hunger Vital Sign    Worried About Running Out of Food in the Last Year: Never true    Ran Out of Food in the  Last Year: Never true  Transportation Needs: No Transportation Needs (01/08/2023)   PRAPARE - Administrator, Civil Service (Medical): No    Lack of Transportation (Non-Medical): No  Physical Activity: Not on file  Stress: Not on file  Social Connections: Not on file    Tobacco Counseling Counseling given: Not Answered   Clinical Intake:  Pre-visit preparation completed: No  Pain : 0-10 Pain Score: 9  Pain Location: Shoulder Pain Onset: Other (comment) (years) Pain Frequency: Constant Pain Relieving Factors: norco followed by Dr orlando  Pain Relieving Factors: norco followed by Dr orlando  BMI - recorded: 28.07 Nutritional Status: BMI 25 -29 Overweight Diabetes: Yes CBG done?: No Did pt. bring in CBG monitor from home?: No  How often do you need to have someone help you when you read instructions, pamphlets, or other written materials from your doctor or pharmacy?: 1 - Never What is the last grade level you completed in school?: Medical doctor  Interpreter Needed?: No  Information entered by :: Tawni America NP   Activities of Daily Living    09/05/2023    3:35 PM 01/08/2023    6:14 PM  In your present state of health, do you have any difficulty performing the following activities:  Hearing? 0 0  Vision? 1 0  Difficulty concentrating or making decisions? 0 0  Walking or climbing stairs? 1   Dressing or bathing? 0   Doing errands, shopping? 0 0  Preparing Food and eating ? N   Using the Toilet? N   In the past six months, have you accidently leaked urine? N   Do you have problems with loss of bowel control? N   Managing your Medications? N   Managing your Finances?  N   Housekeeping or managing your Housekeeping? N     Patient Care Team: Charlanne Fredia CROME, MD as PCP - General (Internal Medicine) Lonni Slain, MD as PCP - Cardiology (Cardiology) Waddell Danelle ORN, MD (Cardiology) Morris Debby BIRCH, MD (Cardiology) Dolphus Reiter, MD  as Consulting Physician (Rheumatology) Avram Lupita BRAVO, MD as Consulting Physician (Gastroenterology) Lucas Dorise POUR, MD as Consulting Physician (Cardiothoracic Surgery) Ernie Cough, MD as Consulting Physician (Orthopedic Surgery) Cloretta Arley NOVAK, MD as Consulting Physician (Oncology) Cary Doffing, MD as Consulting Physician (Dermatology) Jadine Charleston, MD as Consulting Physician (Dermatology) Renda Glance, MD as Consulting Physician (Urology) Rosan Credit, MD as Consulting Physician (Ophthalmology) Patel, Donika K, DO as Consulting Physician (Neurology)  Indicate any recent Medical Services you may have received from other than [facility] providers in the past year (date may be approximate).     Assessment:   This is a routine wellness examination for Tr[place]yn.  Hearing/Vision screen Hearing Screening - Comments:: No hearing issues Vision Screening - Comments:: Dr Leane   Goals Addressed             This Visit's Progress    Patient Stated       Stay alive Would like to maintain current status       Depression Screen    09/05/2023    3:04 PM 05/18/2022   10:13 AM 02/27/2020    2:02 PM 11/28/2019    2:32 PM 09/03/2016   12:32 PM 04/12/2016    1:36 PM 04/15/2015    3:43 PM  PHQ 2/9 Scores  PHQ - 2 Score 0 0 0 0 0 0 0    Fall Risk    09/05/2023    3:04 PM 05/18/2022   10:13 AM 07/01/2021   10:31 AM 03/24/2021    4:45 PM 12/31/2020    9:55 AM  Fall Risk   Falls in the past year? 0 0 0 0 0  Number falls in past yr: 0 0 0 0 0  Injury with Fall? 0 0 0 0 0  Risk for fall due to : Impaired balance/gait No Fall Risks No Fall Risks No Fall Risks   Follow up Falls evaluation completed  Falls evaluation completed  Falls evaluation completed  Falls evaluation completed      Data saved with a previous flowsheet row definition    MEDICARE RISK AT HOME: Medicare Risk at Home Any stairs in or around the home?: No If so, are there any without handrails?: No Home free of  loose throw rugs in walkways, pet beds, electrical cords, etc?: No Adequate lighting in your home to reduce risk of falls?: Yes Life alert?: Yes Use of a cane, walker or w/c?: Yes Grab bars in the bathroom?: Yes Shower chair or bench in shower?: Yes Elevated toilet seat or a handicapped toilet?: Yes  TIMED UP AND GO:  Was the test performed?  No    Cognitive Function:        09/05/2023    3:04 PM  6CIT Screen  What Year? 0 points  What month? 0 points  What time? 0 points  Count back from 20 0 points  Months in reverse 0 points  Repeat phrase 0 points  Total Score 0 points    Immunizations Immunization History  Administered Date(s) Administered   Fluad  Quad(high Dose 65+) 01/18/2022   Fluad  Trivalent(High Dose 65+) 12/29/2022   Hepatitis A, Adult 10/23/2004   Influenza Split 12/30/2017   Influenza, High Dose Seasonal PF 12/28/2019  Influenza,inj,quad, With Preservative 11/29/2017   Influenza-Unspecified 12/19/2020   PFIZER(Purple Top)SARS-COV-2 Vaccination 03/13/2019, 04/02/2019, 11/01/2019   PNEUMOCOCCAL CONJUGATE-20 02/28/2023   Tdap 10/23/2004    TDAP status: Due, Education has been provided regarding the importance of this vaccine. Advised may receive this vaccine at local pharmacy or Health Dept. Aware to provide a copy of the vaccination record if obtained from local pharmacy or Health Dept. Verbalized acceptance and understanding. Declined   Flu Vaccine status: Up to date  Pneumococcal vaccine status: Up to date  Covid-19 vaccine status: Completed vaccines  Qualifies for Shingles Vaccine? No   Zostavax completed No   Shingrix Completed?: No.    Education has been provided regarding the importance of this vaccine. Patient has been advised to call insurance company to determine out of pocket expense if they have not yet received this vaccine. Advised may also receive vaccine at local pharmacy or Health Dept. Verbalized acceptance and  understanding.  Screening Tests Health Maintenance  Topic Date Due   COVID-19 Vaccine (4 - 2024-25 season) 09/21/2023 (Originally 10/31/2022)   Zoster Vaccines- Shingrix (1 of 2) 11/02/2023 (Originally 09/02/1953)   DTaP/Tdap/Td (2 - Td or Tdap) 08/01/2024 (Originally 10/24/2014)   INFLUENZA VACCINE  09/30/2023   Medicare Annual Wellness (AWV)  09/04/2024   Pneumococcal Vaccine: 50+ Years  Completed   Hepatitis B Vaccines  Aged Out   HPV VACCINES  Aged Out   Meningococcal B Vaccine  Aged Out    Health Maintenance  There are no preventive care reminders to display for this patient.   Colorectal cancer screening: No longer required.   Lung Cancer Screening: (Low Dose CT Chest recommended if Age 57-80 years, 20 pack-year currently smoking OR have quit w/in 15years.) does not qualify.   Lung Cancer Screening Referral: NA  Additional Screening:  Hepatitis C Screening: does not qualify; Completed NA  Vision Screening: Recommended annual ophthalmology exams for early detection of glaucoma and other disorders of the eye. Is the patient up to date with their annual eye exam?  Yes  Who is the provider or what is the name of the office in which the patient attends annual eye exams? [facility]  If pt is not established with a provider, would they like to be referred to a provider to establish care? No .   Dental Screening: Recommended annual dental exams for proper oral hygiene  Diabetic Foot Exam: Diabetic Foot Exam: Overdue, Pt has been advised about the importance in completing this exam. Pt is scheduled for diabetic foot exam on will make apt.  Community Resource Referral / Chronic Care Management: CRR required this visit?  No   CCM required this visit?  No     Plan:     I have personally reviewed and noted the following in the patient's chart:   Medical and social history Use of alcohol, tobacco or illicit drugs  Current medications and supplements including opioid  prescriptions. Patient is currently taking opioid prescriptions. Information provided to patient regarding non-opioid alternatives. Patient advised to discuss non-opioid treatment plan with their provider. Functional ability and status Nutritional status Physical activity Advanced directives List of other physicians Hospitalizations, surgeries, and ER visits in previous 12 months Vitals Screenings to include cognitive, depression, and falls Referrals and appointments  In addition, I have reviewed and discussed with patient certain preventive protocols, quality metrics, and best practice recommendations. A written personalized care plan for preventive services as well as general preventive health recommendations were provided to patient.  Tawni America, NP   09/05/2023   After Visit Summary: (In Person-Printed) AVS printed and given to the patient  Nurse Notes: NA

## 2023-09-05 NOTE — Patient Instructions (Signed)
 [name] , Thank you for taking time to come for your Medicare Wellness Visit. I appreciate your ongoing commitment to your health goals. Please review the following plan we discussed and let me know if I can assist you in the future.   Screening recommendations/referrals: Colonoscopy aged out Recommended yearly ophthalmology/optometry visit for glaucoma screening and checkup Recommended yearly dental visit for hygiene and checkup  Vaccinations: Influenza vaccine due annually in September/October Pneumococcal vaccine up to date Tdap vaccine declined  Shingles vaccine recommend Shingrix     Advanced directives: reviewed   Conditions/risks identified: Fall risk, use cane at all times.   Next appointment: 1 year   Preventive Care 87 Years and Older, Male Preventive care refers to lifestyle choices and visits with your health care provider that can promote health and wellness. What does preventive care include? A yearly physical exam. This is also called an annual well check. Dental exams once or twice a year. Routine eye exams. Ask your health care provider how often you should have your eyes checked. Personal lifestyle choices, including: Daily care of your teeth and gums. Regular physical activity. Eating a healthy diet. Avoiding tobacco and drug use. Limiting alcohol use. Practicing safe sex. Taking low doses of aspirin  every day. Taking vitamin and mineral supplements as recommended by your health care provider. What happens during an annual well check? The services and screenings done by your health care provider during your annual well check will depend on your age, overall health, lifestyle risk factors, and family history of disease. Counseling  Your health care provider may ask you questions about your: Alcohol use. Tobacco use. Drug use. Emotional well-being. Home and relationship well-being. Sexual activity. Eating habits. History of falls. Memory and ability  to understand (cognition). Work and work Astronomer. Screening  You may have the following tests or measurements: Height, weight, and BMI. Blood pressure. Lipid and cholesterol levels. These may be checked every 5 years, or more frequently if you are over 23 years old. Skin check. Lung cancer screening. You may have this screening every year starting at age 32 if you have a 30-pack-year history of smoking and currently smoke or have quit within the past 15 years. Fecal occult blood test (FOBT) of the stool. You may have this test every year starting at age 92. Flexible sigmoidoscopy or colonoscopy. You may have a sigmoidoscopy every 5 years or a colonoscopy every 10 years starting at age 87. Prostate cancer screening. Recommendations will vary depending on your family history and other risks. Hepatitis C blood test. Hepatitis B blood test. Sexually transmitted disease (STD) testing. Diabetes screening. This is done by checking your blood sugar (glucose) after you have not eaten for a while (fasting). You may have this done every 1-3 years. Abdominal aortic aneurysm (AAA) screening. You may need this if you are a current or former smoker. Osteoporosis. You may be screened starting at age 5 if you are at high risk. Talk with your health care provider about your test results, treatment options, and if necessary, the need for more tests. Vaccines  Your health care provider may recommend certain vaccines, such as: Influenza vaccine. This is recommended every year. Tetanus, diphtheria, and acellular pertussis (Tdap, Td) vaccine. You may need a Td booster every 10 years. Zoster vaccine. You may need this after age 74. Pneumococcal 13-valent conjugate (PCV13) vaccine. One dose is recommended after age 67. Pneumococcal polysaccharide (PPSV23) vaccine. One dose is recommended after age 26. Talk to your health care  provider about which screenings and vaccines you need and how often you need  them. This information is not intended to replace advice given to you by your health care provider. Make sure you discuss any questions you have with your health care provider. Document Released: 03/14/2015 Document Revised: 11/05/2015 Document Reviewed: 12/17/2014 Elsevier Interactive Patient Education  2017 [employer].  Fall Prevention in the Home Falls can cause injuries. They can happen to people of all ages. There are many things you can do to make your home safe and to help prevent falls. What can I do on the outside of my home? Regularly fix the edges of walkways and driveways and fix any cracks. Remove anything that might make you trip as you walk through a door, such as a raised step or threshold. Trim any bushes or trees on the path to your home. Use bright outdoor lighting. Clear any walking paths of anything that might make someone trip, such as rocks or tools. Regularly check to see if handrails are loose or broken. Make sure that both sides of any steps have handrails. Any raised decks and porches should have guardrails on the edges. Have any leaves, snow, or ice cleared regularly. Use sand or salt on walking paths during winter. Clean up any spills in your garage right away. This includes oil or grease spills. What can I do in the bathroom? Use night lights. Install grab bars by the toilet and in the tub and shower. Do not use towel bars as grab bars. Use non-skid mats or decals in the tub or shower. If you need to sit down in the shower, use a plastic, non-slip stool. Keep the floor dry. Clean up any water that spills on the floor as soon as it happens. Remove soap buildup in the tub or shower regularly. Attach bath mats securely with double-sided non-slip rug tape. Do not have throw rugs and other things on the floor that can make you trip. What can I do in the bedroom? Use night lights. Make sure that you have a light by your bed that is easy to reach. Do not use  any sheets or blankets that are too big for your bed. They should not hang down onto the floor. Have a firm chair that has side arms. You can use this for support while you get dressed. Do not have throw rugs and other things on the floor that can make you trip. What can I do in the kitchen? Clean up any spills right away. Avoid walking on wet floors. Keep items that you use a lot in easy-to-reach places. If you need to reach something above you, use a strong step stool that has a grab bar. Keep electrical cords out of the way. Do not use floor polish or wax that makes floors slippery. If you must use wax, use non-skid floor wax. Do not have throw rugs and other things on the floor that can make you trip. What can I do with my stairs? Do not leave any items on the stairs. Make sure that there are handrails on both sides of the stairs and use them. Fix handrails that are broken or loose. Make sure that handrails are as long as the stairways. Check any carpeting to make sure that it is firmly attached to the stairs. Fix any carpet that is loose or worn. Avoid having throw rugs at the top or bottom of the stairs. If you do have throw rugs, attach them to the  floor with carpet tape. Make sure that you have a light switch at the top of the stairs and the bottom of the stairs. If you do not have them, ask someone to add them for you. What else can I do to help prevent falls? Wear shoes that: Do not have high heels. Have rubber bottoms. Are comfortable and fit you well. Are closed at the toe. Do not wear sandals. If you use a stepladder: Make sure that it is fully opened. Do not climb a closed stepladder. Make sure that both sides of the stepladder are locked into place. Ask someone to hold it for you, if possible. Clearly mark and make sure that you can see: Any grab bars or handrails. First and last steps. Where the edge of each step is. Use tools that help you move around (mobility aids)  if they are needed. These include: Canes. Walkers. Scooters. Crutches. Turn on the lights when you go into a dark area. Replace any light bulbs as soon as they burn out. Set up your furniture so you have a clear path. Avoid moving your furniture around. If any of your floors are uneven, fix them. If there are any pets around you, be aware of where they are. Review your medicines with your doctor. Some medicines can make you feel dizzy. This can increase your chance of falling. Ask your doctor what other things that you can do to help prevent falls. This information is not intended to replace advice given to you by your health care provider. Make sure you discuss any questions you have with your health care provider. Document Released: 12/12/2008 Document Revised: 07/24/2015 Document Reviewed: 03/22/2014 Elsevier Interactive Patient Education  2017 [employer].

## 2023-09-08 ENCOUNTER — Ambulatory Visit: Payer: Medicare Other

## 2023-09-08 DIAGNOSIS — I495 Sick sinus syndrome: Secondary | ICD-10-CM

## 2023-09-09 LAB — CUP PACEART REMOTE DEVICE CHECK
Battery Remaining Longevity: 30 mo
Battery Remaining Percentage: 32 %
Brady Statistic RA Percent Paced: 1 %
Brady Statistic RV Percent Paced: 73 %
Date Time Interrogation Session: 20250710023700
Implantable Lead Connection Status: 753985
Implantable Lead Connection Status: 753985
Implantable Lead Implant Date: 20170417
Implantable Lead Implant Date: 20170417
Implantable Lead Location: 753859
Implantable Lead Location: 753860
Implantable Lead Model: 7740
Implantable Lead Model: 7741
Implantable Lead Serial Number: 662696
Implantable Lead Serial Number: 751382
Implantable Pulse Generator Implant Date: 20170417
Lead Channel Impedance Value: 664 Ohm
Lead Channel Impedance Value: 703 Ohm
Lead Channel Setting Pacing Amplitude: 2.5 V
Lead Channel Setting Pacing Amplitude: 2.5 V
Lead Channel Setting Pacing Pulse Width: 0.4 ms
Lead Channel Setting Sensing Sensitivity: 2.5 mV
Pulse Gen Serial Number: 718418
Zone Setting Status: 755011

## 2023-09-12 ENCOUNTER — Ambulatory Visit: Payer: Self-pay | Admitting: Internal Medicine

## 2023-09-13 DIAGNOSIS — E119 Type 2 diabetes mellitus without complications: Secondary | ICD-10-CM | POA: Diagnosis not present

## 2023-09-15 ENCOUNTER — Other Ambulatory Visit: Payer: Self-pay

## 2023-09-15 ENCOUNTER — Other Ambulatory Visit (HOSPITAL_BASED_OUTPATIENT_CLINIC_OR_DEPARTMENT_OTHER): Payer: Self-pay

## 2023-09-15 ENCOUNTER — Other Ambulatory Visit: Payer: Self-pay | Admitting: Internal Medicine

## 2023-09-15 DIAGNOSIS — M48062 Spinal stenosis, lumbar region with neurogenic claudication: Secondary | ICD-10-CM | POA: Diagnosis not present

## 2023-09-15 DIAGNOSIS — Z79891 Long term (current) use of opiate analgesic: Secondary | ICD-10-CM | POA: Diagnosis not present

## 2023-09-15 DIAGNOSIS — M47812 Spondylosis without myelopathy or radiculopathy, cervical region: Secondary | ICD-10-CM | POA: Diagnosis not present

## 2023-09-15 DIAGNOSIS — G894 Chronic pain syndrome: Secondary | ICD-10-CM | POA: Diagnosis not present

## 2023-09-15 MED ORDER — HYDROCODONE-ACETAMINOPHEN 7.5-325 MG PO TABS
1.0000 | ORAL_TABLET | Freq: Three times a day (TID) | ORAL | 0 refills | Status: DC | PRN
  Filled 2023-09-16: qty 90, 30d supply, fill #0

## 2023-09-16 ENCOUNTER — Other Ambulatory Visit (HOSPITAL_BASED_OUTPATIENT_CLINIC_OR_DEPARTMENT_OTHER): Payer: Self-pay

## 2023-09-16 MED ORDER — DOXAZOSIN MESYLATE 4 MG PO TABS
4.0000 mg | ORAL_TABLET | Freq: Every day | ORAL | 3 refills | Status: AC
  Filled 2023-09-29 – 2023-10-11 (×2): qty 90, 90d supply, fill #0
  Filled 2023-12-12 – 2023-12-19 (×2): qty 90, 90d supply, fill #1
  Filled 2024-04-02: qty 90, 90d supply, fill #2

## 2023-09-16 MED ORDER — PREDNISONE 5 MG PO TABS
5.0000 mg | ORAL_TABLET | Freq: Every day | ORAL | 1 refills | Status: AC
  Filled 2023-09-16 – 2023-11-10 (×2): qty 90, 90d supply, fill #0
  Filled 2024-01-20: qty 90, 90d supply, fill #1

## 2023-09-20 ENCOUNTER — Encounter: Payer: Self-pay | Admitting: Gastroenterology

## 2023-09-28 DIAGNOSIS — L853 Xerosis cutis: Secondary | ICD-10-CM | POA: Diagnosis not present

## 2023-09-29 ENCOUNTER — Other Ambulatory Visit (HOSPITAL_BASED_OUTPATIENT_CLINIC_OR_DEPARTMENT_OTHER): Payer: Self-pay

## 2023-10-10 ENCOUNTER — Inpatient Hospital Stay: Payer: Medicare Other | Attending: Oncology

## 2023-10-10 ENCOUNTER — Other Ambulatory Visit (HOSPITAL_BASED_OUTPATIENT_CLINIC_OR_DEPARTMENT_OTHER): Payer: Self-pay

## 2023-10-10 DIAGNOSIS — Z950 Presence of cardiac pacemaker: Secondary | ICD-10-CM | POA: Diagnosis not present

## 2023-10-10 DIAGNOSIS — D472 Monoclonal gammopathy: Secondary | ICD-10-CM | POA: Diagnosis not present

## 2023-10-10 DIAGNOSIS — I2510 Atherosclerotic heart disease of native coronary artery without angina pectoris: Secondary | ICD-10-CM | POA: Diagnosis not present

## 2023-10-10 DIAGNOSIS — Z952 Presence of prosthetic heart valve: Secondary | ICD-10-CM | POA: Insufficient documentation

## 2023-10-10 DIAGNOSIS — K5792 Diverticulitis of intestine, part unspecified, without perforation or abscess without bleeding: Secondary | ICD-10-CM | POA: Insufficient documentation

## 2023-10-10 DIAGNOSIS — Z8673 Personal history of transient ischemic attack (TIA), and cerebral infarction without residual deficits: Secondary | ICD-10-CM | POA: Insufficient documentation

## 2023-10-10 DIAGNOSIS — N400 Enlarged prostate without lower urinary tract symptoms: Secondary | ICD-10-CM | POA: Insufficient documentation

## 2023-10-10 DIAGNOSIS — I4891 Unspecified atrial fibrillation: Secondary | ICD-10-CM | POA: Diagnosis not present

## 2023-10-10 DIAGNOSIS — D649 Anemia, unspecified: Secondary | ICD-10-CM | POA: Diagnosis not present

## 2023-10-10 LAB — CBC WITH DIFFERENTIAL (CANCER CENTER ONLY)
Abs Immature Granulocytes: 0.03 K/uL (ref 0.00–0.07)
Basophils Absolute: 0 K/uL (ref 0.0–0.1)
Basophils Relative: 0 %
Eosinophils Absolute: 0.3 K/uL (ref 0.0–0.5)
Eosinophils Relative: 4 %
HCT: 34.7 % — ABNORMAL LOW (ref 39.0–52.0)
Hemoglobin: 11.2 g/dL — ABNORMAL LOW (ref 13.0–17.0)
Immature Granulocytes: 0 %
Lymphocytes Relative: 12 %
Lymphs Abs: 0.9 K/uL (ref 0.7–4.0)
MCH: 29.6 pg (ref 26.0–34.0)
MCHC: 32.3 g/dL (ref 30.0–36.0)
MCV: 91.6 fL (ref 80.0–100.0)
Monocytes Absolute: 0.5 K/uL (ref 0.1–1.0)
Monocytes Relative: 7 %
Neutro Abs: 6 K/uL (ref 1.7–7.7)
Neutrophils Relative %: 77 %
Platelet Count: 202 K/uL (ref 150–400)
RBC: 3.79 MIL/uL — ABNORMAL LOW (ref 4.22–5.81)
RDW: 14.2 % (ref 11.5–15.5)
WBC Count: 7.8 K/uL (ref 4.0–10.5)
nRBC: 0 % (ref 0.0–0.2)

## 2023-10-10 LAB — CMP (CANCER CENTER ONLY)
ALT: 19 U/L (ref 0–44)
AST: 25 U/L (ref 15–41)
Albumin: 4.3 g/dL (ref 3.5–5.0)
Alkaline Phosphatase: 124 U/L (ref 38–126)
Anion gap: 12 (ref 5–15)
BUN: 13 mg/dL (ref 8–23)
CO2: 29 mmol/L (ref 22–32)
Calcium: 10 mg/dL (ref 8.9–10.3)
Chloride: 100 mmol/L (ref 98–111)
Creatinine: 1.17 mg/dL (ref 0.61–1.24)
GFR, Estimated: >60 mL/min (ref >60)
Glucose, Bld: 153 mg/dL — ABNORMAL HIGH (ref 70–99)
Potassium: 3.9 mmol/L (ref 3.5–5.1)
Sodium: 141 mmol/L (ref 135–145)
Total Bilirubin: 0.5 mg/dL (ref 0.0–1.2)
Total Protein: 8 g/dL (ref 6.5–8.1)

## 2023-10-11 ENCOUNTER — Other Ambulatory Visit (HOSPITAL_BASED_OUTPATIENT_CLINIC_OR_DEPARTMENT_OTHER): Payer: Self-pay

## 2023-10-11 LAB — KAPPA/LAMBDA LIGHT CHAINS
Kappa free light chain: 28.1 mg/L — ABNORMAL HIGH (ref 3.3–19.4)
Kappa, lambda light chain ratio: 0.52 (ref 0.26–1.65)
Lambda free light chains: 53.6 mg/L — ABNORMAL HIGH (ref 5.7–26.3)

## 2023-10-11 LAB — IGM: IgM (Immunoglobulin M), Srm: 1439 mg/dL — ABNORMAL HIGH (ref 15–143)

## 2023-10-12 LAB — PROTEIN ELECTROPHORESIS, SERUM
A/G Ratio: 1 (ref 0.7–1.7)
Albumin ELP: 3.6 g/dL (ref 2.9–4.4)
Alpha-1-Globulin: 0.3 g/dL (ref 0.0–0.4)
Alpha-2-Globulin: 0.8 g/dL (ref 0.4–1.0)
Beta Globulin: 0.9 g/dL (ref 0.7–1.3)
Gamma Globulin: 1.8 g/dL (ref 0.4–1.8)
Globulin, Total: 3.7 g/dL (ref 2.2–3.9)
M-Spike, %: 0.7 g/dL — ABNORMAL HIGH
Total Protein ELP: 7.3 g/dL (ref 6.0–8.5)

## 2023-10-13 ENCOUNTER — Other Ambulatory Visit (HOSPITAL_BASED_OUTPATIENT_CLINIC_OR_DEPARTMENT_OTHER): Payer: Self-pay

## 2023-10-13 DIAGNOSIS — M48062 Spinal stenosis, lumbar region with neurogenic claudication: Secondary | ICD-10-CM | POA: Diagnosis not present

## 2023-10-13 DIAGNOSIS — G894 Chronic pain syndrome: Secondary | ICD-10-CM | POA: Diagnosis not present

## 2023-10-13 DIAGNOSIS — M47812 Spondylosis without myelopathy or radiculopathy, cervical region: Secondary | ICD-10-CM | POA: Diagnosis not present

## 2023-10-13 DIAGNOSIS — Z79891 Long term (current) use of opiate analgesic: Secondary | ICD-10-CM | POA: Diagnosis not present

## 2023-10-13 MED ORDER — HYDROCODONE-ACETAMINOPHEN 7.5-325 MG PO TABS
1.0000 | ORAL_TABLET | Freq: Three times a day (TID) | ORAL | 0 refills | Status: DC
  Filled 2023-10-15: qty 90, 30d supply, fill #0

## 2023-10-15 ENCOUNTER — Other Ambulatory Visit (HOSPITAL_BASED_OUTPATIENT_CLINIC_OR_DEPARTMENT_OTHER): Payer: Self-pay

## 2023-10-16 ENCOUNTER — Other Ambulatory Visit (HOSPITAL_BASED_OUTPATIENT_CLINIC_OR_DEPARTMENT_OTHER): Payer: Self-pay

## 2023-10-17 ENCOUNTER — Inpatient Hospital Stay (HOSPITAL_BASED_OUTPATIENT_CLINIC_OR_DEPARTMENT_OTHER): Payer: Medicare Other | Admitting: Oncology

## 2023-10-17 VITALS — BP 146/78 | HR 79 | Temp 97.7°F | Resp 18 | Ht 72.0 in | Wt 210.0 lb

## 2023-10-17 DIAGNOSIS — Z950 Presence of cardiac pacemaker: Secondary | ICD-10-CM | POA: Diagnosis not present

## 2023-10-17 DIAGNOSIS — I2510 Atherosclerotic heart disease of native coronary artery without angina pectoris: Secondary | ICD-10-CM | POA: Diagnosis not present

## 2023-10-17 DIAGNOSIS — N400 Enlarged prostate without lower urinary tract symptoms: Secondary | ICD-10-CM | POA: Diagnosis not present

## 2023-10-17 DIAGNOSIS — D472 Monoclonal gammopathy: Secondary | ICD-10-CM

## 2023-10-17 DIAGNOSIS — K5792 Diverticulitis of intestine, part unspecified, without perforation or abscess without bleeding: Secondary | ICD-10-CM | POA: Diagnosis not present

## 2023-10-17 DIAGNOSIS — D649 Anemia, unspecified: Secondary | ICD-10-CM | POA: Diagnosis not present

## 2023-10-17 NOTE — Progress Notes (Signed)
  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  He feels weak .  No fever, night sweats, or recent infections.  He has intermittent lower abdominal pain from diverticulitis.  He is followed by gastroenterology. He takes MiraLAX  for constipation.  He has chronic pain and is followed at the pain clinic.  Objective:  Vital signs in last 24 hours:  Blood pressure (!) 146/78, pulse 79, temperature 97.7 F (36.5 C), temperature source Temporal, resp. rate 18, height 6' (1.829 m), weight 210 lb (95.3 kg), SpO2 98%.    Lymphatics: No cervical, supraclavicular, axillary, or inguinal nodes Resp: End inspiratory rhonchi at the left posterior base, no respiratory distress Cardio: Regular rhythm with premature beats GI: No hepatosplenomegaly, mild tenderness in the left lower abdomen, no mass Vascular: No leg edema   Lab Results:  Lab Results  Component Value Date   WBC 7.8 10/10/2023   HGB 11.2 (L) 10/10/2023   HCT 34.7 (L) 10/10/2023   MCV 91.6 10/10/2023   PLT 202 10/10/2023   NEUTROABS 6.0 10/10/2023    CMP  Lab Results  Component Value Date   NA 141 10/10/2023   K 3.9 10/10/2023   CL 100 10/10/2023   CO2 29 10/10/2023   GLUCOSE 153 (H) 10/10/2023   BUN 13 10/10/2023   CREATININE 1.17 10/10/2023   CALCIUM  10.0 10/10/2023   PROT 8.0 10/10/2023   ALBUMIN  4.3 10/10/2023   AST 25 10/10/2023   ALT 19 10/10/2023   ALKPHOS 124 10/10/2023   BILITOT 0.5 10/10/2023   GFRNONAA >60 10/10/2023   GFRAA 92 11/26/2019     Medications: I have reviewed the patient's current medications.   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  He is stable from a hematologic standpoint.  The IgM and serum M spike have not changed significantly over the past year.  The plan is to continue observation.  There is no clinical evidence of a progressive lymphoproliferative disorder.  He will return for an office and lab visit in 8 months.  He will call as needed in the interim.  Arley Hof, MD  10/17/2023  12:05 PM

## 2023-10-18 ENCOUNTER — Telehealth: Payer: Self-pay | Admitting: *Deleted

## 2023-10-18 ENCOUNTER — Other Ambulatory Visit: Payer: Self-pay | Admitting: *Deleted

## 2023-10-18 ENCOUNTER — Encounter: Payer: Self-pay | Admitting: Oncology

## 2023-10-18 ENCOUNTER — Encounter: Payer: Self-pay | Admitting: Gastroenterology

## 2023-10-18 ENCOUNTER — Ambulatory Visit: Admitting: Gastroenterology

## 2023-10-18 VITALS — BP 144/82 | HR 61 | Ht 73.0 in | Wt 210.1 lb

## 2023-10-18 DIAGNOSIS — K581 Irritable bowel syndrome with constipation: Secondary | ICD-10-CM

## 2023-10-18 DIAGNOSIS — K219 Gastro-esophageal reflux disease without esophagitis: Secondary | ICD-10-CM

## 2023-10-18 DIAGNOSIS — R1319 Other dysphagia: Secondary | ICD-10-CM

## 2023-10-18 DIAGNOSIS — K5730 Diverticulosis of large intestine without perforation or abscess without bleeding: Secondary | ICD-10-CM | POA: Diagnosis not present

## 2023-10-18 DIAGNOSIS — K5909 Other constipation: Secondary | ICD-10-CM | POA: Diagnosis not present

## 2023-10-18 DIAGNOSIS — R1013 Epigastric pain: Secondary | ICD-10-CM

## 2023-10-18 DIAGNOSIS — R1032 Left lower quadrant pain: Secondary | ICD-10-CM

## 2023-10-18 DIAGNOSIS — R638 Other symptoms and signs concerning food and fluid intake: Secondary | ICD-10-CM | POA: Diagnosis not present

## 2023-10-18 DIAGNOSIS — K8020 Calculus of gallbladder without cholecystitis without obstruction: Secondary | ICD-10-CM

## 2023-10-18 DIAGNOSIS — Z8719 Personal history of other diseases of the digestive system: Secondary | ICD-10-CM | POA: Diagnosis not present

## 2023-10-18 NOTE — Telephone Encounter (Signed)
 [facility] Pre-operative Risk Assessment     Request for surgical clearance:     Endoscopy Procedure  What type of surgery is being performed?     Upper Endoscopy  When is this surgery scheduled?     11/10/2023  What type of clearance is required ?   Pharmacy  Are there any medications that need to be held prior to surgery and how long? Eliquis  2 days   Practice name and name of physician performing surgery?      [facility]  What is your office phone and fax number?      Phone- [phone]  Fax- [phone]  Anesthesia type (None, local, MAC, general) ?       MAC   Please route your response to Grayce Loge, CMA

## 2023-10-18 NOTE — Telephone Encounter (Signed)
 Forwarded this message to Print production planner.

## 2023-10-18 NOTE — Progress Notes (Signed)
 Kevin GORMAN Finn, MD    [id number]    [birth year]  Primary Care Physician:Gupta, Fredia CROME, MD  Referring Physician: Charlanne Fredia CROME, MD [address] [employer],  [place] [address]   Chief complaint:  LLQ pain, epigastric pain, GERD  Discussed the use of AI scribe software for clinical note transcription with the patient, who gave verbal consent to proceed.  History of Present Illness Kevin GORMAN Finn, MD Gene is an 89 year old male with diverticular disease who presents with persistent left lower quadrant pain.  Left lower quadrant abdominal pain - Persistent, constant left lower quadrant pain without significant worsening - Recent episode of pressure while lying flat last night, prompting self-administration of ciprofloxacin  (had not taken in several months) - No associated fever, vomiting, or hematochezia - History of diverticular disease  Bowel habit changes - Bowel movements occur approximately once daily - Stools are less formed - Routine use of Miralax  and Linzess  to aid bowel movements  Upper gastrointestinal symptoms - Frequent burping, epigastric discomfort especially before meals - Uses over-the-counter Zegerid in addition to Protonix  for symptom management - Improvement in symptoms with Zegerid   Gallbladder disease history - History of gallstones - No significant gallbladder pain currently  Dietary habits and nutritional concerns - Skips breakfast and consumes small, infrequent meals due to fear of postprandial discomfort - Supplements diet with protein drinks - Occasionally eats bananas and eggs - Avoids yogurt but is open to trying kefir      Outpatient Encounter Medications as of 10/18/2023  Medication Sig   amLODipine  (NORVASC ) 2.5 MG tablet Take 2.5 mg by mouth as needed. (Patient taking differently: Take 2.5 mg by mouth every morning.)   apixaban  (ELIQUIS ) 5 MG TABS tablet Take 1 tablet (5 mg total) by mouth 2 (two) times daily.    butalbital -acetaminophen -caffeine  (FIORICET ) 50-325-40 MG tablet Take 1 tablet by mouth three times a day as needed pt aware of acetaminophen  in Norco as well (Patient taking differently: Take 1 tablet by mouth 2 (two) times daily.)   cephALEXin  (KEFLEX ) 500 MG capsule Take 1 capsule (500 mg total) by mouth 3 (three) times daily. (Patient taking differently: Take 500 mg by mouth as needed.)   cholecalciferol  (VITAMIN D3) 25 MCG (1000 UNIT) tablet Take 1,000 Units by mouth daily.   cilostazol  (PLETAL ) 100 MG tablet Take 1 tablet (100 mg total) by mouth 2 (two) times daily.   Coenzyme Q10 200 MG TABS Take 200 mg by mouth daily.   desoximetasone  (TOPICORT ) 0.25 % cream Apply 1 Application topically 2 (two) times daily.   diclofenac Sodium (VOLTAREN) 1 % GEL Apply 2 g topically 2 (two) times daily as needed (knee pain).   digoxin  (LANOXIN ) 0.125 MG tablet Take 1 tablet (0.125 mg total) by mouth daily.   doxazosin  (CARDURA ) 4 MG tablet Take 1 tablet (4 mg total) by mouth daily.   ezetimibe  (ZETIA ) 10 MG tablet Take 1 tablet (10 mg total) by mouth daily.   furosemide  (LASIX ) 20 MG tablet Take 20 mg by mouth daily as needed for fluid.   HYDROcodone -acetaminophen  (NORCO) 7.5-325 MG tablet Take 1 tablet by mouth 3 (three) times daily as directed. Stop hydrocodone /apap 10/325   hydrocortisone  (ANUSOL -HC) 25 MG suppository Place 1 suppository (25 mg total) rectally at bedtime as needed for hemorrhoids or anal itching.   linaclotide  (LINZESS ) 145 MCG CAPS capsule Take 1 capsule (145 mcg total) by mouth daily before breakfast.  LORazepam  (ATIVAN ) 1 MG tablet Take 1 tablet (1 mg) by mouth at bedtime, and 0.5 tablet (0.5 mg) daily as needed for anxiety.   magnesium  oxide (MAG-OX) 400 MG tablet Take 1 tablet (400 mg total) by mouth daily.   metoprolol  succinate (TOPROL  XL) 25 MG 24 hr tablet Take 3 tablets (75 mg total) by mouth 2 (two) times daily. (Patient taking differently: Take 50 mg by mouth 2 (two) times  daily.)   Multiple Vitamins-Minerals (MULTIVITAMIN WITH MINERALS) tablet Take 1 tablet by mouth daily.   nitroGLYCERIN  (NITROSTAT ) 0.4 MG SL tablet Place 1 tablet (0.4 mg total) under the tongue every 5 (five) minutes as needed for chest pain.   Omeprazole-Sodium Bicarbonate (ZEGERID PO) Take 1 capsule by mouth as needed (acid reflux). (Patient taking differently: Take 1 capsule by mouth 2 (two) times daily.)   pantoprazole  (PROTONIX ) 40 MG tablet Take 1 tablet (40 mg total) by mouth daily.   Polyethyl Glycol-Propyl Glycol (SYSTANE OP) Place 1 drop into both eyes daily as needed (dry eyes).   polyethylene glycol (MIRALAX  / GLYCOLAX ) 17 g packet Take 17 g by mouth daily. Patient taking 1-2 everyday   potassium chloride  SA (KLOR-CON  M) 20 MEQ tablet Take 1 tablet (20 mEq total) by mouth daily.   predniSONE  (DELTASONE ) 5 MG tablet Take 1 tablet (5 mg total) by mouth daily with breakfast.   rosuvastatin  (CRESTOR ) 20 MG tablet Take 1 tablet (20 mg total) by mouth at bedtime. Please keep scheduled appointment for future refills.   sennosides-docusate sodium  (SENOKOT-S) 8.6-50 MG tablet Take 1 tablet by mouth 2 (two) times daily. (Patient taking differently: Take 1 tablet by mouth as needed.)   telmisartan  (MICARDIS ) 20 MG tablet Take 1 tablet (20 mg total) by mouth daily.   amLODipine  (NORVASC ) 5 MG tablet Take 1 tablet (5 mg total) by mouth daily. (Patient not taking: Reported on 10/18/2023)   [DISCONTINUED] HYDROcodone -acetaminophen  (NORCO) 10-325 MG tablet Take 1 tablet by mouth three times a day as directed DX G89.4 (Patient not taking: Reported on 10/17/2023)   [DISCONTINUED] HYDROcodone -acetaminophen  (NORCO) 7.5-325 MG tablet Take 1 tablet by mouth 3 (three) times daily as directed. (Patient not taking: Reported on 10/17/2023)   [DISCONTINUED] pregabalin (LYRICA) 75 MG capsule Take 75 mg by mouth 2 (two) times daily.   No facility-administered encounter medications on file as of 10/18/2023.     Allergies as of 10/18/2023 - Review Complete 10/17/2023  Allergen Reaction Noted   Contrast media [iodinated contrast media] Hives 02/01/2011   Gadolinium derivatives Hives 10/09/2012   Iodine-131 Hives 10/07/2021   Metrizamide Hives 02/01/2011   Tetanus toxoids  10/07/2021    Past Medical History:  Diagnosis Date   Aortic stenosis    moderate aortic stenosis   Arthritis    Benign prostatic hypertrophy    Chronic systolic heart failure (HCC)    Chronotropic incompetence with sinus node dysfunction    Status post Guidant dual-mode, dual-pacing, dual-sensing  pacemaker   implantation now programmed to AAI with recent generator change.   Coronary artery disease    status post multiple prior percutaneous coronary interventions, microvascular angina per Dr Obie   Diverticulitis sigmoid colon recurrent    Dysfunctional autonomic nervous system    Dyspnea    Heart murmur    History of primary hypertension    Hypercoagulable state (HCC)    chronically anticoagulated with coumadin    Hyperlipidemia    Hyperthyroidism    Hypothyroidism    Dr. Mirna   MGUS (monoclonal  gammopathy of unknown significance) 02/17/2013   Ocular myasthenia (HCC)    Osteoarthritis    Paroxysmal atrial fibrillation (HCC)    DR. Morris,    Prediabetes 09/21/2017   Stroke (HCC)    1990    Past Surgical History:  Procedure Laterality Date   AORTIC VALVE REPLACEMENT  03/15/2011   Procedure: AORTIC VALVE REPLACEMENT (AVR);  Surgeon: Dorise MARLA Fellers, MD;  Location: [facility] OR;  Service: Open Heart Surgery;  Laterality: N/A;   APPENDECTOMY     CARDIAC CATHETERIZATION     13   CARDIOVERSION     CARDIOVERSION  04/15/2011   Procedure: CARDIOVERSION;  Surgeon: Ezra Shuck, MD;  Location: [facility] ENDOSCOPY;  Service: Cardiovascular;  Laterality: N/A;   CARDIOVERSION N/A 09/11/2014   Procedure: CARDIOVERSION;  Surgeon: Wilbert JONELLE Bihari, MD;  Location: [facility] ENDOSCOPY;  Service: Cardiovascular;  Laterality: N/A;    CARDIOVERSION N/A 06/27/2015   Procedure: CARDIOVERSION;  Surgeon: Aleene JINNY Passe, MD;  Location: [facility] ENDOSCOPY;  Service: Cardiovascular;  Laterality: N/A;   CARDIOVERSION N/A 07/04/2015   Procedure: CARDIOVERSION;  Surgeon: Danelle LELON Birmingham, MD;  Location: [facility] ENDOSCOPY;  Service: Cardiovascular;  Laterality: N/A;   CARDIOVERSION N/A 04/13/2017   Procedure: CARDIOVERSION;  Surgeon: Bihari Wilbert JONELLE, MD;  Location: [facility] ENDOSCOPY;  Service: Cardiovascular;  Laterality: N/A;   COLONOSCOPY     CORONARY STENT INTERVENTION N/A 10/15/2019   Procedure: CORONARY STENT INTERVENTION;  Surgeon: Dann Candyce RAMAN, MD;  Location: [facility] INVASIVE CV LAB;  Service: Cardiovascular;  Laterality: N/A;   EP IMPLANTABLE DEVICE N/A 06/16/2015   Procedure: Pacemaker Implant;  Surgeon: Danelle LELON Birmingham, MD;  Location: [facility] INVASIVE CV LAB;  Service: Cardiovascular;  Laterality: N/A;   hemrrhoidectomy     LEFT AND RIGHT HEART CATHETERIZATION WITH CORONARY ANGIOGRAM Bilateral 02/01/2011   Procedure: LEFT AND RIGHT HEART CATHETERIZATION WITH CORONARY ANGIOGRAM;  Surgeon: Debby JONETTA Morris, MD;  Location: [facility] CATH LAB;  Service: Cardiovascular;  Laterality: Bilateral;   LEFT HEART CATH AND CORONARY ANGIOGRAPHY N/A 10/15/2019   Procedure: LEFT HEART CATH AND CORONARY ANGIOGRAPHY;  Surgeon: Dann Candyce RAMAN, MD;  Location: [facility] INVASIVE CV LAB;  Service: Cardiovascular;  Laterality: N/A;   LEFT HEART CATH AND CORONARY ANGIOGRAPHY N/A 10/07/2020   Procedure: LEFT HEART CATH AND CORONARY ANGIOGRAPHY;  Surgeon: Wonda Sharper, MD;  Location: [facility] INVASIVE CV LAB;  Service: Cardiovascular;  Laterality: N/A;   LEFT HEART CATH AND CORONARY ANGIOGRAPHY N/A 07/06/2022   Procedure: LEFT HEART CATH AND CORONARY ANGIOGRAPHY;  Surgeon: Wonda Sharper, MD;  Location: [facility] INVASIVE CV LAB;  Service: Cardiovascular;  Laterality: N/A;   LUMBAR LAMINECTOMY/DECOMPRESSION MICRODISCECTOMY Right 02/23/2021   Procedure: Right Lumbar four-five  microdiscectomy;  Surgeon: Joshua Alm RAMAN, MD;  Location: [facility] OR;  Service: Neurosurgery;  Laterality: Right;   MAZE  03/15/2011   Procedure: MAZE;  Surgeon: Dorise MARLA Fellers, MD;  Location: [facility] OR;  Service: Open Heart Surgery;  Laterality: N/A;   MOHS SURGERY  09/2021   PACEMAKER INSERTION  1991   Guidant PPM, most recent Generator Change by Dr Obie was 08/22/06   RIGHT/LEFT HEART CATH AND CORONARY ANGIOGRAPHY N/A 07/06/2016   Procedure: Right/Left Heart Cath and Coronary Angiography;  Surgeon: Wonda Sharper, MD;  Location: [facility] INVASIVE CV LAB;  Service: Cardiovascular;  Laterality: N/A;   TEE WITHOUT CARDIOVERSION  04/15/2011   Procedure: TRANSESOPHAGEAL ECHOCARDIOGRAM (TEE);  Surgeon: Ezra Shuck, MD;  Location: [facility] ENDOSCOPY;  Service: Cardiovascular;  Laterality: N/A;   TEE WITHOUT CARDIOVERSION N/A 09/11/2014  Procedure: TRANSESOPHAGEAL ECHOCARDIOGRAM (TEE);  Surgeon: Wilbert JONELLE Bihari, MD;  Location: [facility] ENDOSCOPY;  Service: Cardiovascular;  Laterality: N/A;    Family History  Problem Relation Age of Onset   Heart disease Brother        Twin brother has coronary disease and recent AVR for AS   CAD Brother    Atrial fibrillation Brother    Lymphoma Brother    Sarcoidosis Brother    Depression Daughter    Anorexia nervosa Daughter    Hypertension Son    Anesthesia problems Neg Hx    Hypotension Neg Hx    Malignant hyperthermia Neg Hx    Pseudochol deficiency Neg Hx     Social History   Socioeconomic History   Marital status: Married    Spouse name: Alisa    Number of children: 3   Years of education: Not on file   Highest education level: Not on file  Occupational History   Occupation: Retired    Comment: Physician  Tobacco Use   Smoking status: Never   Smokeless tobacco: Never  Vaping Use   Vaping status: Never Used  Substance and Sexual Activity   Alcohol use: No    Alcohol/week: 0.0 standard drinks of alcohol   Drug use: No   Sexual activity: Not Currently   Other Topics Concern   Not on file  Social History Narrative   Married to [place]. Has grown children   Retired Proofreader MD      Never smoker no alcohol      Social Drivers of Corporate investment banker Strain: Not on file  Food Insecurity: No Food Insecurity (01/08/2023)   Hunger Vital Sign    Worried About Running Out of Food in the Last Year: Never true    Ran Out of Food in the Last Year: Never true  Transportation Needs: No Transportation Needs (01/08/2023)   PRAPARE - Administrator, Civil Service (Medical): No    Lack of Transportation (Non-Medical): No  Physical Activity: Not on file  Stress: Not on file  Social Connections: Not on file  Intimate Partner Violence: Not At Risk (01/08/2023)   Humiliation, Afraid, Rape, and Kick questionnaire    Fear of Current or Ex-Partner: No    Emotionally Abused: No    Physically Abused: No    Sexually Abused: No      Review of systems: All other review of systems negative except as mentioned in the HPI.   Physical Exam: Vitals:   10/18/23 1008  BP: (!) 144/82  Pulse: 61   Body mass index is 27.72 kg/m. Gen:      No acute distress HEENT:  sclera anicteric Abd:      soft, non-tender; no palpable masses, no distension Ext:    No edema Neuro: alert and oriented x 3 Psych: normal mood and affect  Data Reviewed:  Reviewed labs, radiology imaging, old records and pertinent past GI work up     Assessment and Plan Assessment & Plan Diverticular disease of colon with chronic constipation Chronic diverticular disease with persistent left lower quadrant pain. Pain is intermittent and not associated with fever or significant changes in bowel habits. Recent exacerbation managed with increased fluid intake and low residue diet. Pain resolved without antibiotics. - Increase fluid intake and maintain a low residue diet. - Use Miralax  daily and increase dose to achieve loose/less formed stools. - Avoid antibiotics  unless pain persists for more than 48 hours. - Monitor for  signs of abscess or perforation such as persistent pain, fever, or changes in bowel habits.  Gastroesophageal reflux disease with dyspepsia Chronic GERD with dyspepsia, characterized by frequent burping and epigastric discomfort. Currently managed with Protonix . Recent use of Zegerid noted, but advised against due to potential interaction with Pletal . Symptoms improved with Zegerid but advised to discontinue due to interaction risks. Upper endoscopy planned to evaluate for potential ulcers or other abnormalities, to be scheduled at [facility] endoscopy unit. - Increase Protonix  to 40 mg twice daily. - Discontinue Zegerid due to potential interaction with Pletal . - Schedule upper endoscopy to evaluate for potential ulcers or other abnormalities. - Provide samples of FD Gard for dyspepsia management.  Cholelithiasis (gallstones) Presence of gallstones noted, but no current symptoms suggestive of acute cholecystitis or biliary colic. Discussed potential for referred pain but no immediate intervention planned due to lack of symptoms. He expressed disinterest in invasive procedures for gallstone management. - Monitor for symptoms of gallbladder pain or complications. - Consider referral for further evaluation if symptoms develop.  Nutritional concern due to reduced oral intake Reduced oral intake with preference for small, frequent meals. Occasional use of protein drinks. Advised against yogurt if not preferred. Discussed alternative options such as kefir. - Encourage small, frequent meals to manage intake. - Continue use of protein drinks as needed. - Consider kefir as an alternative to yogurt if desired.    This visit required >40 minutes of patient care (this includes precharting, chart review, review of results, face-to-face time used for counseling as well as treatment plan and follow-up. The patient was provided an  opportunity to ask questions and all were answered. The patient agreed with the plan and demonstrated an understanding of the instructions.  LOIS Wilkie Mcgee , MD    CC: Charlanne Fredia CROME, MD

## 2023-10-18 NOTE — Telephone Encounter (Signed)
 I am covering [facility] week of September 8, please check if I can do EGD at [facility] September 9 or 11 around 10 AM? Thank you ~ Dr Shila  Patient is scheduled at [facility] on 11/10/2023 at 8:30 am. Dr Charlanne has the other [facility] spots filled  FYI Dr Shila is this time  ok? I will need to get him off his Eliquis  too

## 2023-10-18 NOTE — Telephone Encounter (Signed)
 Yes, thank you.

## 2023-10-18 NOTE — Patient Instructions (Signed)
 VISIT SUMMARY:  You visited us  today due to persistent left lower quadrant pain related to your diverticular disease. We discussed your gastrointestinal symptoms, including changes in bowel habits and frequent burping, as well as your history of gallstones and dietary habits.  YOUR PLAN:  DIVERTICULAR DISEASE OF COLON WITH CHRONIC CONSTIPATION: You have chronic diverticular disease with persistent left lower quadrant pain. The pain is intermittent and not associated with fever or significant changes in bowel habits. -Increase your fluid intake and maintain a low residue diet. -Use Miralax  daily and increase the dose to achieve loose stools. -Avoid antibiotics unless the pain persists for more than 48 hours. -Monitor for signs of abscess or perforation such as persistent pain, fever, or changes in bowel habits.  GASTROESOPHAGEAL REFLUX DISEASE WITH DYSPEPSIA: You have chronic GERD with frequent burping and discomfort. You are currently managing it with Protonix . -Continue taking Protonix  twice daily. -Discontinue using Zegerid due to potential interaction with Pletal . -We will schedule an upper endoscopy to check for potential ulcers or other abnormalities. -We will provide samples of FD Gard for managing dyspepsia.  CHOLELITHIASIS (GALLSTONES): You have a history of gallstones but no current symptoms suggestive of acute gallbladder issues. -Monitor for symptoms of gallbladder pain or complications. -Consider a referral for further evaluation if symptoms develop.  NUTRITIONAL CONCERN DUE TO REDUCED ORAL INTAKE: You have reduced oral intake and prefer small, frequent meals. You occasionally use protein drinks and avoid yogurt. -Encourage small, frequent meals to manage your intake. -Continue using protein drinks as needed. -Consider trying kefir as an alternative to yogurt if desired.

## 2023-10-18 NOTE — H&P (View-Only) (Signed)
 Kevin GORMAN Finn, MD    [id number]    [birth year]  Primary Care Physician:Gupta, Fredia CROME, MD  Referring Physician: Charlanne Fredia CROME, MD [address] [place] [employer],  [place] [address]   Chief complaint:  LLQ pain, epigastric pain, GERD  Discussed the use of AI scribe software for clinical note transcription with the patient, who gave verbal consent to proceed.  History of Present Illness Kevin GORMAN Finn, MD [name] is an 89 year old male with diverticular disease who presents with persistent left lower quadrant pain.  Left lower quadrant abdominal pain - Persistent, constant left lower quadrant pain without significant worsening - Recent episode of pressure while lying flat last night, prompting self-administration of ciprofloxacin  (had not taken in several months) - No associated fever, vomiting, or hematochezia - History of diverticular disease  Bowel habit changes - Bowel movements occur approximately once daily - Stools are less formed - Routine use of Miralax  and Linzess  to aid bowel movements  Upper gastrointestinal symptoms - Frequent burping, epigastric discomfort especially before meals - Uses over-the-counter Zegerid in addition to Protonix  for symptom management - Improvement in symptoms with Zegerid   Gallbladder disease history - History of gallstones - No significant gallbladder pain currently  Dietary habits and nutritional concerns - Skips breakfast and consumes small, infrequent meals due to fear of postprandial discomfort - Supplements diet with protein drinks - Occasionally eats bananas and eggs - Avoids yogurt but is open to trying kefir      Outpatient Encounter Medications as of 10/18/2023  Medication Sig   amLODipine  (NORVASC ) 2.5 MG tablet Take 2.5 mg by mouth as needed. (Patient taking differently: Take 2.5 mg by mouth every morning.)   apixaban  (ELIQUIS ) 5 MG TABS tablet Take 1 tablet (5 mg total) by mouth 2 (two) times daily.    butalbital -acetaminophen -caffeine  (FIORICET ) 50-325-40 MG tablet Take 1 tablet by mouth three times a day as needed pt aware of acetaminophen  in Norco as well (Patient taking differently: Take 1 tablet by mouth 2 (two) times daily.)   cephALEXin  (KEFLEX ) 500 MG capsule Take 1 capsule (500 mg total) by mouth 3 (three) times daily. (Patient taking differently: Take 500 mg by mouth as needed.)   cholecalciferol  (VITAMIN D3) 25 MCG (1000 UNIT) tablet Take 1,000 Units by mouth daily.   cilostazol  (PLETAL ) 100 MG tablet Take 1 tablet (100 mg total) by mouth 2 (two) times daily.   Coenzyme Q10 200 MG TABS Take 200 mg by mouth daily.   desoximetasone  (TOPICORT ) 0.25 % cream Apply 1 Application topically 2 (two) times daily.   diclofenac Sodium (VOLTAREN) 1 % GEL Apply 2 g topically 2 (two) times daily as needed (knee pain).   digoxin  (LANOXIN ) 0.125 MG tablet Take 1 tablet (0.125 mg total) by mouth daily.   doxazosin  (CARDURA ) 4 MG tablet Take 1 tablet (4 mg total) by mouth daily.   ezetimibe  (ZETIA ) 10 MG tablet Take 1 tablet (10 mg total) by mouth daily.   furosemide  (LASIX ) 20 MG tablet Take 20 mg by mouth daily as needed for fluid.   HYDROcodone -acetaminophen  (NORCO) 7.5-325 MG tablet Take 1 tablet by mouth 3 (three) times daily as directed. Stop hydrocodone /apap 10/325   hydrocortisone  (ANUSOL -HC) 25 MG suppository Place 1 suppository (25 mg total) rectally at bedtime as needed for hemorrhoids or anal itching.   linaclotide  (LINZESS ) 145 MCG CAPS capsule Take 1 capsule (145 mcg total) by mouth daily before breakfast.  LORazepam  (ATIVAN ) 1 MG tablet Take 1 tablet (1 mg) by mouth at bedtime, and 0.5 tablet (0.5 mg) daily as needed for anxiety.   magnesium  oxide (MAG-OX) 400 MG tablet Take 1 tablet (400 mg total) by mouth daily.   metoprolol  succinate (TOPROL  XL) 25 MG 24 hr tablet Take 3 tablets (75 mg total) by mouth 2 (two) times daily. (Patient taking differently: Take 50 mg by mouth 2 (two) times  daily.)   Multiple Vitamins-Minerals (MULTIVITAMIN WITH MINERALS) tablet Take 1 tablet by mouth daily.   nitroGLYCERIN  (NITROSTAT ) 0.4 MG SL tablet Place 1 tablet (0.4 mg total) under the tongue every 5 (five) minutes as needed for chest pain.   Omeprazole-Sodium Bicarbonate (ZEGERID PO) Take 1 capsule by mouth as needed (acid reflux). (Patient taking differently: Take 1 capsule by mouth 2 (two) times daily.)   pantoprazole  (PROTONIX ) 40 MG tablet Take 1 tablet (40 mg total) by mouth daily.   Polyethyl Glycol-Propyl Glycol (SYSTANE OP) Place 1 drop into both eyes daily as needed (dry eyes).   polyethylene glycol (MIRALAX  / GLYCOLAX ) 17 g packet Take 17 g by mouth daily. Patient taking 1-2 everyday   potassium chloride  SA (KLOR-CON  M) 20 MEQ tablet Take 1 tablet (20 mEq total) by mouth daily.   predniSONE  (DELTASONE ) 5 MG tablet Take 1 tablet (5 mg total) by mouth daily with breakfast.   rosuvastatin  (CRESTOR ) 20 MG tablet Take 1 tablet (20 mg total) by mouth at bedtime. Please keep scheduled appointment for future refills.   sennosides-docusate sodium  (SENOKOT-S) 8.6-50 MG tablet Take 1 tablet by mouth 2 (two) times daily. (Patient taking differently: Take 1 tablet by mouth as needed.)   telmisartan  (MICARDIS ) 20 MG tablet Take 1 tablet (20 mg total) by mouth daily.   amLODipine  (NORVASC ) 5 MG tablet Take 1 tablet (5 mg total) by mouth daily. (Patient not taking: Reported on 10/18/2023)   [DISCONTINUED] HYDROcodone -acetaminophen  (NORCO) 10-325 MG tablet Take 1 tablet by mouth three times a day as directed DX G89.4 (Patient not taking: Reported on 10/17/2023)   [DISCONTINUED] HYDROcodone -acetaminophen  (NORCO) 7.5-325 MG tablet Take 1 tablet by mouth 3 (three) times daily as directed. (Patient not taking: Reported on 10/17/2023)   [DISCONTINUED] pregabalin (LYRICA) 75 MG capsule Take 75 mg by mouth 2 (two) times daily.   No facility-administered encounter medications on file as of 10/18/2023.     Allergies as of 10/18/2023 - Review Complete 10/17/2023  Allergen Reaction Noted   Contrast media [iodinated contrast media] Hives 02/01/2011   Gadolinium derivatives Hives 10/09/2012   Iodine-131 Hives 10/07/2021   Metrizamide Hives 02/01/2011   Tetanus toxoids  10/07/2021    Past Medical History:  Diagnosis Date   Aortic stenosis    moderate aortic stenosis   Arthritis    Benign prostatic hypertrophy    Chronic systolic heart failure (HCC)    Chronotropic incompetence with sinus node dysfunction    Status post Guidant dual-mode, dual-pacing, dual-sensing  pacemaker   implantation now programmed to AAI with recent generator change.   Coronary artery disease    status post multiple prior percutaneous coronary interventions, microvascular angina per Dr Obie   Diverticulitis sigmoid colon recurrent    Dysfunctional autonomic nervous system    Dyspnea    Heart murmur    History of primary hypertension    Hypercoagulable state (HCC)    chronically anticoagulated with coumadin    Hyperlipidemia    Hyperthyroidism    Hypothyroidism    Dr. Mirna   MGUS (monoclonal  gammopathy of unknown significance) 02/17/2013   Ocular myasthenia (HCC)    Osteoarthritis    Paroxysmal atrial fibrillation (HCC)    DR. Morris,    Prediabetes 09/21/2017   Stroke (HCC)    1990    Past Surgical History:  Procedure Laterality Date   AORTIC VALVE REPLACEMENT  03/15/2011   Procedure: AORTIC VALVE REPLACEMENT (AVR);  Surgeon: Dorise MARLA Fellers, MD;  Location: [facility] OR;  Service: Open Heart Surgery;  Laterality: N/A;   APPENDECTOMY     CARDIAC CATHETERIZATION     13   CARDIOVERSION     CARDIOVERSION  04/15/2011   Procedure: CARDIOVERSION;  Surgeon: Ezra Shuck, MD;  Location: [facility] ENDOSCOPY;  Service: Cardiovascular;  Laterality: N/A;   CARDIOVERSION N/A 09/11/2014   Procedure: CARDIOVERSION;  Surgeon: Wilbert JONELLE Bihari, MD;  Location: [facility] ENDOSCOPY;  Service: Cardiovascular;  Laterality: N/A;    CARDIOVERSION N/A 06/27/2015   Procedure: CARDIOVERSION;  Surgeon: Aleene JINNY Passe, MD;  Location: [facility] ENDOSCOPY;  Service: Cardiovascular;  Laterality: N/A;   CARDIOVERSION N/A 07/04/2015   Procedure: CARDIOVERSION;  Surgeon: Danelle LELON Birmingham, MD;  Location: [facility] ENDOSCOPY;  Service: Cardiovascular;  Laterality: N/A;   CARDIOVERSION N/A 04/13/2017   Procedure: CARDIOVERSION;  Surgeon: Bihari Wilbert JONELLE, MD;  Location: [facility] ENDOSCOPY;  Service: Cardiovascular;  Laterality: N/A;   COLONOSCOPY     CORONARY STENT INTERVENTION N/A 10/15/2019   Procedure: CORONARY STENT INTERVENTION;  Surgeon: Dann Candyce RAMAN, MD;  Location: [facility] INVASIVE CV LAB;  Service: Cardiovascular;  Laterality: N/A;   EP IMPLANTABLE DEVICE N/A 06/16/2015   Procedure: Pacemaker Implant;  Surgeon: Danelle LELON Birmingham, MD;  Location: [facility] INVASIVE CV LAB;  Service: Cardiovascular;  Laterality: N/A;   hemrrhoidectomy     LEFT AND RIGHT HEART CATHETERIZATION WITH CORONARY ANGIOGRAM Bilateral 02/01/2011   Procedure: LEFT AND RIGHT HEART CATHETERIZATION WITH CORONARY ANGIOGRAM;  Surgeon: Debby JONETTA Morris, MD;  Location: [facility] CATH LAB;  Service: Cardiovascular;  Laterality: Bilateral;   LEFT HEART CATH AND CORONARY ANGIOGRAPHY N/A 10/15/2019   Procedure: LEFT HEART CATH AND CORONARY ANGIOGRAPHY;  Surgeon: Dann Candyce RAMAN, MD;  Location: [facility] INVASIVE CV LAB;  Service: Cardiovascular;  Laterality: N/A;   LEFT HEART CATH AND CORONARY ANGIOGRAPHY N/A 10/07/2020   Procedure: LEFT HEART CATH AND CORONARY ANGIOGRAPHY;  Surgeon: Wonda Sharper, MD;  Location: [facility] INVASIVE CV LAB;  Service: Cardiovascular;  Laterality: N/A;   LEFT HEART CATH AND CORONARY ANGIOGRAPHY N/A 07/06/2022   Procedure: LEFT HEART CATH AND CORONARY ANGIOGRAPHY;  Surgeon: Wonda Sharper, MD;  Location: [facility] INVASIVE CV LAB;  Service: Cardiovascular;  Laterality: N/A;   LUMBAR LAMINECTOMY/DECOMPRESSION MICRODISCECTOMY Right 02/23/2021   Procedure: Right Lumbar four-five  microdiscectomy;  Surgeon: Joshua Alm RAMAN, MD;  Location: [facility] OR;  Service: Neurosurgery;  Laterality: Right;   MAZE  03/15/2011   Procedure: MAZE;  Surgeon: Dorise MARLA Fellers, MD;  Location: [facility] OR;  Service: Open Heart Surgery;  Laterality: N/A;   MOHS SURGERY  09/2021   PACEMAKER INSERTION  1991   Guidant PPM, most recent Generator Change by Dr Obie was 08/22/06   RIGHT/LEFT HEART CATH AND CORONARY ANGIOGRAPHY N/A 07/06/2016   Procedure: Right/Left Heart Cath and Coronary Angiography;  Surgeon: Wonda Sharper, MD;  Location: [facility] INVASIVE CV LAB;  Service: Cardiovascular;  Laterality: N/A;   TEE WITHOUT CARDIOVERSION  04/15/2011   Procedure: TRANSESOPHAGEAL ECHOCARDIOGRAM (TEE);  Surgeon: Ezra Shuck, MD;  Location: [facility] ENDOSCOPY;  Service: Cardiovascular;  Laterality: N/A;   TEE WITHOUT CARDIOVERSION N/A 09/11/2014  Procedure: TRANSESOPHAGEAL ECHOCARDIOGRAM (TEE);  Surgeon: Wilbert JONELLE Bihari, MD;  Location: [facility] ENDOSCOPY;  Service: Cardiovascular;  Laterality: N/A;    Family History  Problem Relation Age of Onset   Heart disease Brother        Twin brother has coronary disease and recent AVR for AS   CAD Brother    Atrial fibrillation Brother    Lymphoma Brother    Sarcoidosis Brother    Depression Daughter    Anorexia nervosa Daughter    Hypertension Son    Anesthesia problems Neg Hx    Hypotension Neg Hx    Malignant hyperthermia Neg Hx    Pseudochol deficiency Neg Hx     Social History   Socioeconomic History   Marital status: Married    Spouse name: Alisa    Number of children: 3   Years of education: Not on file   Highest education level: Not on file  Occupational History   Occupation: Retired    Comment: Physician  Tobacco Use   Smoking status: Never   Smokeless tobacco: Never  Vaping Use   Vaping status: Never Used  Substance and Sexual Activity   Alcohol use: No    Alcohol/week: 0.0 standard drinks of alcohol   Drug use: No   Sexual activity: Not Currently   Other Topics Concern   Not on file  Social History Narrative   Married to [place]. Has grown children   Retired Proofreader MD      Never smoker no alcohol      Social Drivers of Corporate investment banker Strain: Not on file  Food Insecurity: No Food Insecurity (01/08/2023)   Hunger Vital Sign    Worried About Running Out of Food in the Last Year: Never true    Ran Out of Food in the Last Year: Never true  Transportation Needs: No Transportation Needs (01/08/2023)   PRAPARE - Administrator, Civil Service (Medical): No    Lack of Transportation (Non-Medical): No  Physical Activity: Not on file  Stress: Not on file  Social Connections: Not on file  Intimate Partner Violence: Not At Risk (01/08/2023)   Humiliation, Afraid, Rape, and Kick questionnaire    Fear of Current or Ex-Partner: No    Emotionally Abused: No    Physically Abused: No    Sexually Abused: No      Review of systems: All other review of systems negative except as mentioned in the HPI.   Physical Exam: Vitals:   10/18/23 1008  BP: (!) 144/82  Pulse: 61   Body mass index is 27.72 kg/m. Gen:      No acute distress HEENT:  sclera anicteric Abd:      soft, non-tender; no palpable masses, no distension Ext:    No edema Neuro: alert and oriented x 3 Psych: normal mood and affect  Data Reviewed:  Reviewed labs, radiology imaging, old records and pertinent past GI work up     Assessment and Plan Assessment & Plan Diverticular disease of colon with chronic constipation Chronic diverticular disease with persistent left lower quadrant pain. Pain is intermittent and not associated with fever or significant changes in bowel habits. Recent exacerbation managed with increased fluid intake and low residue diet. Pain resolved without antibiotics. - Increase fluid intake and maintain a low residue diet. - Use Miralax  daily and increase dose to achieve loose/less formed stools. - Avoid antibiotics  unless pain persists for more than 48 hours. - Monitor for  signs of abscess or perforation such as persistent pain, fever, or changes in bowel habits.  Gastroesophageal reflux disease with dyspepsia Chronic GERD with dyspepsia, characterized by frequent burping and epigastric discomfort. Currently managed with Protonix . Recent use of Zegerid noted, but advised against due to potential interaction with Pletal . Symptoms improved with Zegerid but advised to discontinue due to interaction risks. Upper endoscopy planned to evaluate for potential ulcers or other abnormalities, to be scheduled at [facility] endoscopy unit. - Increase Protonix  to 40 mg twice daily. - Discontinue Zegerid due to potential interaction with Pletal . - Schedule upper endoscopy to evaluate for potential ulcers or other abnormalities. - Provide samples of FD Gard for dyspepsia management.  Cholelithiasis (gallstones) Presence of gallstones noted, but no current symptoms suggestive of acute cholecystitis or biliary colic. Discussed potential for referred pain but no immediate intervention planned due to lack of symptoms. He expressed disinterest in invasive procedures for gallstone management. - Monitor for symptoms of gallbladder pain or complications. - Consider referral for further evaluation if symptoms develop.  Nutritional concern due to reduced oral intake Reduced oral intake with preference for small, frequent meals. Occasional use of protein drinks. Advised against yogurt if not preferred. Discussed alternative options such as kefir. - Encourage small, frequent meals to manage intake. - Continue use of protein drinks as needed. - Consider kefir as an alternative to yogurt if desired.    This visit required >40 minutes of patient care (this includes precharting, chart review, review of results, face-to-face time used for counseling as well as treatment plan and follow-up. The patient was provided an  opportunity to ask questions and all were answered. The patient agreed with the plan and demonstrated an understanding of the instructions.  [name] , MD    CC: Charlanne Fredia CROME, MD

## 2023-10-20 ENCOUNTER — Telehealth: Payer: Self-pay | Admitting: Oncology

## 2023-10-20 ENCOUNTER — Encounter: Payer: Self-pay | Admitting: *Deleted

## 2023-10-20 NOTE — Telephone Encounter (Signed)
 Left message foe caretaker [name]. We have scheduled [name] for EGD at [facility] on 11/10/2023 at 8:30 am. I will mail out the instructions. AMB referral done. Anticoag letter sent

## 2023-10-20 NOTE — Telephone Encounter (Signed)
 Contacted pt to schedule an appt. Unable to reach via phone, voicemail was left.     Follow-Up Information  Follow-up disposition: Return for Lab, office.  Check out comments: Office 8 months  Lab 1 week prior to office

## 2023-10-21 NOTE — Telephone Encounter (Signed)
 I have sent a secure chat to Grayce Loge, CMA with GI office. I sent over the notes from preop APP and pharm-din regard to blood thinner and risk of being off blood thinner.   Me: please let us  know if Dr. Shila needs blood thinner held or if procedure can be done on blood thinner

## 2023-10-21 NOTE — Telephone Encounter (Signed)
 Patient with diagnosis of A Fib on Eliquis  for anticoagulation.    Procedure: upper endoscopy Date of procedure: 11/10/23   CHA2DS2-VASc Score = 8  This indicates a 10.8% annual risk of stroke. The patient's score is based upon: CHF History: 1 HTN History: 1 Diabetes History: 1 Stroke History: 2 Vascular Disease History: 1 Age Score: 2 Gender Score: 0    CrCl 60 ml/min Platelet count 202K   Patient will be very high risk off anticoagulation. Suggest reaching out to Dr Trenna office to see if an Eliquis  hold is necessary  **This guidance is not considered finalized until pre-operative APP has relayed final recommendations.**

## 2023-10-21 NOTE — Telephone Encounter (Signed)
 Per Pharmacy:10/21/2023  Patient will be very high risk off anticoagulation (CHADS VASC 8). Suggest reaching out to Dr Trenna office to see if an Eliquis  hold is necessary.  Please clarify with surgeon about holding Eliquis  Phone- [phone]

## 2023-10-21 NOTE — Telephone Encounter (Signed)
 Hi [name], I am going to send a note to you to d/w Dr. Shila about blood thinner.   [name], NP  10/21/23 11:14 AM Note Per Pharmacy:10/21/2023  Patient will be very high risk off anticoagulation (CHADS VASC 8). Suggest reaching out to Dr Trenna office to see if an Eliquis  hold is necessary.  Please clarify with surgeon about holding Eliquis  Phone- [phone]   10/21/23 11:02 AM Pavero, Lonni, RPH routed this conversation to [address]  Darrell Lonni, [facility]  10/21/23 11:02 AM Note Patient with diagnosis of A Fib on Eliquis  for anticoagulation.   Procedure: upper endoscopy Date of procedure: 11/10/23   CHA2DS2-VASc Score = 8  This indicates a 10.8% annual risk of stroke. The patient's score is based upon: CHF History: 1 HTN History: 1 Diabetes History: 1 Stroke History: 2 Vascular Disease History: 1 Age Score: 2 Gender Score: 0   CrCl 60 ml/min Platelet count 202K   Patient will be very high risk off anticoagulation. Suggest reaching out to Dr Trenna office to see if an Eliquis  hold is necessary  **This guidance is not considered finalized until pre-operative APP has relayed final recommendations.**      CF please let us  know if Dr. Shila needs blood thinner held or if procedure can be done on blood thinner  11:41 AM Kavitha Nandigam V, MD was added by you. 11:56 AM Please advise Dr Shila   11:57 AM [name], NP was added by Niels HERO Jest, CMA. 11:57 AM CF Niels HERO Jest, CMA I have added preop APP Rosaline Bane, NP onto the chat as well  11:57 AM Lamarr HERO Jerilynn, NP was added by [name], NP. 12:07 PM Lamarr HERO Jerilynn, NP Patient is high risk for holding anticoagulation due to CHADS VASC Score of 8 per pharmacy. I wrote a note and sent to there office.   KL Their offie  12:09 PM CF Niels HERO Jest, CMA Yeah I did a secure chat as well lol   12:14 PM KL Lamarr HERO Jerilynn, NP  left. 12:15 PM MS [name], NP left. 12:17 PM KN [name], MD we will proceed without holding anticoagulation, Harbour Nordmeyer please advise him to stay on Eliquis . Thanks  12:20 PM CF Niels HERO Jest, CMA Lamarr so does pt need an appt ?

## 2023-10-21 NOTE — Telephone Encounter (Signed)
 See Anti-Coag note, Patient to remain on blood thinner. Dr Shila agrees

## 2023-10-21 NOTE — Telephone Encounter (Signed)
 I will forward to preop APP to review if any further notes are needed. See notes

## 2023-10-22 ENCOUNTER — Other Ambulatory Visit (HOSPITAL_BASED_OUTPATIENT_CLINIC_OR_DEPARTMENT_OTHER): Payer: Self-pay

## 2023-10-24 ENCOUNTER — Other Ambulatory Visit (HOSPITAL_BASED_OUTPATIENT_CLINIC_OR_DEPARTMENT_OTHER): Payer: Self-pay

## 2023-10-24 ENCOUNTER — Other Ambulatory Visit: Payer: Self-pay

## 2023-10-25 ENCOUNTER — Other Ambulatory Visit (HOSPITAL_COMMUNITY): Payer: Self-pay

## 2023-10-25 NOTE — Telephone Encounter (Signed)
 Called Kim ( nurse) for Dr Cloretta again  and explained to her the date, time and location of patients procedure and to STAY ON his blood thinner Eliquis .  She understands his instructions and will call back if any questions

## 2023-10-31 ENCOUNTER — Other Ambulatory Visit (HOSPITAL_BASED_OUTPATIENT_CLINIC_OR_DEPARTMENT_OTHER): Payer: Self-pay

## 2023-11-02 ENCOUNTER — Telehealth: Payer: Self-pay | Admitting: Gastroenterology

## 2023-11-02 NOTE — Telephone Encounter (Addendum)
 Procedure:Endoscopy Procedure date: 11/10/23 Procedure location: [facility] Arrival Time: 7:00 am Spoke with the patient Y/N: Yes Any prep concerns? No  Has the patient obtained the prep from the pharmacy ? No prep needed Do you have a care partner and transportation: Yes Any additional concerns? No

## 2023-11-02 NOTE — Telephone Encounter (Signed)
 Request for EGD pharmacy clearance only. Pharmacy and Dr. Shila all agreeable to continue DOAC through procedure. Will route my message to requesting fax and clear from preop pool.

## 2023-11-04 NOTE — Telephone Encounter (Signed)
 After leaving messages for Dr Amaryllis Finn, his RN Luke [phone] I was able to reach his wife Alisa and she said she will relay the message to continue the Eliquis , do NOT hold it for the procedure.

## 2023-11-07 NOTE — Telephone Encounter (Signed)
 See 8/19 phone encounter, I had already spoke to [place] over the phone. Before I left for vacation about Gene not holding hid Eliquis  before procedure .It is documented in 8/19 appointment for EGD phone encounter

## 2023-11-08 ENCOUNTER — Other Ambulatory Visit (HOSPITAL_BASED_OUTPATIENT_CLINIC_OR_DEPARTMENT_OTHER): Payer: Self-pay

## 2023-11-08 MED ORDER — HYDROCODONE-ACETAMINOPHEN 7.5-325 MG PO TABS
1.0000 | ORAL_TABLET | Freq: Three times a day (TID) | ORAL | 0 refills | Status: DC
  Filled 2023-11-17: qty 90, 30d supply, fill #0

## 2023-11-10 ENCOUNTER — Other Ambulatory Visit (HOSPITAL_BASED_OUTPATIENT_CLINIC_OR_DEPARTMENT_OTHER): Payer: Self-pay

## 2023-11-10 ENCOUNTER — Encounter (HOSPITAL_COMMUNITY): Payer: Self-pay | Admitting: Gastroenterology

## 2023-11-10 ENCOUNTER — Ambulatory Visit (HOSPITAL_COMMUNITY): Admitting: Registered Nurse

## 2023-11-10 ENCOUNTER — Other Ambulatory Visit: Payer: Self-pay

## 2023-11-10 ENCOUNTER — Ambulatory Visit (HOSPITAL_COMMUNITY)
Admission: RE | Admit: 2023-11-10 | Discharge: 2023-11-10 | Disposition: A | Discharge status: Home / Self Care | Attending: Gastroenterology | Admitting: Gastroenterology

## 2023-11-10 ENCOUNTER — Encounter (HOSPITAL_COMMUNITY)
Admission: RE | Disposition: A | Discharge status: Home / Self Care | Payer: Self-pay | Source: Home / Self Care | Attending: Gastroenterology

## 2023-11-10 DIAGNOSIS — R1013 Epigastric pain: Secondary | ICD-10-CM | POA: Diagnosis not present

## 2023-11-10 DIAGNOSIS — I25118 Atherosclerotic heart disease of native coronary artery with other forms of angina pectoris: Secondary | ICD-10-CM | POA: Diagnosis not present

## 2023-11-10 DIAGNOSIS — I2510 Atherosclerotic heart disease of native coronary artery without angina pectoris: Secondary | ICD-10-CM | POA: Insufficient documentation

## 2023-11-10 DIAGNOSIS — K219 Gastro-esophageal reflux disease without esophagitis: Secondary | ICD-10-CM | POA: Insufficient documentation

## 2023-11-10 DIAGNOSIS — Z952 Presence of prosthetic heart valve: Secondary | ICD-10-CM | POA: Insufficient documentation

## 2023-11-10 DIAGNOSIS — K5730 Diverticulosis of large intestine without perforation or abscess without bleeding: Secondary | ICD-10-CM | POA: Insufficient documentation

## 2023-11-10 DIAGNOSIS — I5022 Chronic systolic (congestive) heart failure: Secondary | ICD-10-CM | POA: Insufficient documentation

## 2023-11-10 DIAGNOSIS — R1310 Dysphagia, unspecified: Secondary | ICD-10-CM | POA: Insufficient documentation

## 2023-11-10 DIAGNOSIS — Z950 Presence of cardiac pacemaker: Secondary | ICD-10-CM | POA: Diagnosis not present

## 2023-11-10 DIAGNOSIS — I5030 Unspecified diastolic (congestive) heart failure: Secondary | ICD-10-CM

## 2023-11-10 DIAGNOSIS — Z955 Presence of coronary angioplasty implant and graft: Secondary | ICD-10-CM | POA: Insufficient documentation

## 2023-11-10 DIAGNOSIS — I110 Hypertensive heart disease with heart failure: Secondary | ICD-10-CM

## 2023-11-10 DIAGNOSIS — Z7901 Long term (current) use of anticoagulants: Secondary | ICD-10-CM | POA: Insufficient documentation

## 2023-11-10 DIAGNOSIS — I4891 Unspecified atrial fibrillation: Secondary | ICD-10-CM | POA: Diagnosis not present

## 2023-11-10 HISTORY — PX: ESOPHAGOGASTRODUODENOSCOPY: SHX5428

## 2023-11-10 SURGERY — EGD (ESOPHAGOGASTRODUODENOSCOPY)
Anesthesia: Monitor Anesthesia Care

## 2023-11-10 MED ORDER — PROPOFOL 10 MG/ML IV BOLUS
INTRAVENOUS | Status: DC | PRN
  Administered 2023-11-10: 20 mg via INTRAVENOUS
  Administered 2023-11-10 (×2): 30 mg via INTRAVENOUS

## 2023-11-10 MED ORDER — DEXLANSOPRAZOLE 30 MG PO CPDR
30.0000 mg | DELAYED_RELEASE_CAPSULE | Freq: Every day | ORAL | 11 refills | Status: DC
  Filled 2023-11-10: qty 30, 30d supply, fill #0

## 2023-11-10 MED ORDER — LIDOCAINE 2% (20 MG/ML) 5 ML SYRINGE
INTRAMUSCULAR | Status: DC | PRN
  Administered 2023-11-10: 60 mg via INTRAVENOUS

## 2023-11-10 MED ORDER — LANSOPRAZOLE 30 MG PO CPDR
30.0000 mg | DELAYED_RELEASE_CAPSULE | Freq: Every day | ORAL | 11 refills | Status: AC
  Filled 2023-11-10: qty 30, 30d supply, fill #0
  Filled 2023-12-05: qty 30, 30d supply, fill #1
  Filled 2023-12-29: qty 30, 30d supply, fill #2
  Filled 2024-02-02 – 2024-02-14 (×2): qty 30, 30d supply, fill #3
  Filled 2024-03-10: qty 30, 30d supply, fill #4

## 2023-11-10 MED ORDER — PROPOFOL 500 MG/50ML IV EMUL
INTRAVENOUS | Status: DC | PRN
  Administered 2023-11-10: 120 ug/kg/min via INTRAVENOUS

## 2023-11-10 MED ORDER — SODIUM CHLORIDE 0.9 % IV SOLN: INTRAVENOUS | Status: DC

## 2023-11-10 MED ORDER — RIFAXIMIN 550 MG PO TABS
550.0000 mg | ORAL_TABLET | Freq: Three times a day (TID) | ORAL | 0 refills | Status: AC
  Filled 2023-11-10 – 2023-11-11 (×2): qty 42, 14d supply, fill #0

## 2023-11-10 NOTE — Transfer of Care (Signed)
 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  Last Pain:  Vitals:   11/10/23 0930  TempSrc: Temporal  PainSc:       Patients Stated Pain Goal: 6 (11/10/23 0820)  Complications: No notable events documented.

## 2023-11-10 NOTE — Op Note (Signed)
 [facility] Patient Name: [name] Procedure Date : 11/10/2023 MRN: [id number] Attending MD: Gustav ALONSO Mcgee , MD, [id number] Date of Birth: Jul 23, 1934 CSN: [id number] Age: 89 Admit Type: Inpatient Procedure:                Upper GI endoscopy Indications:              Dysphagia, Epigastric abdominal pain, Esophageal                            reflux symptoms that persist despite appropriate                            therapy Providers:                Brentley Landfair V. Jemya Depierro, MD, Jacquelyn Jaci Pierce,                            RN, Farris Southgate, Technician Referring MD:              Medicines:                Monitored Anesthesia Care Complications:            No immediate complications. Estimated Blood Loss:     Estimated blood loss: none. Procedure:                Pre-Anesthesia Assessment:                           - Prior to the procedure, a History and Physical                            was performed, and patient medications and                            allergies were reviewed. The patient's tolerance of                            previous anesthesia was also reviewed. The risks                            and benefits of the procedure and the sedation                            options and risks were discussed with the patient.                            All questions were answered, and informed consent                            was obtained. Prior Anticoagulants: The patient has                            taken no anticoagulant or antiplatelet agents. ASA                            Grade  Assessment: III - A patient with severe                            systemic disease. After reviewing the risks and                            benefits, the patient was deemed in satisfactory                            condition to undergo the procedure.                           After obtaining informed consent, the endoscope was                            passed under  direct vision. Throughout the                            procedure, the patient's blood pressure, pulse, and                            oxygen saturations were monitored continuously. The                            [id number]-H190 ([id number]) Olympus endoscope was introduced                            through the mouth, and advanced to the second part                            of duodenum. The upper GI endoscopy was                            accomplished without difficulty. The patient                            tolerated the procedure well. Scope In: Scope Out: Findings:      The Z-line was regular and was found 44 cm from the incisors.      The gastroesophageal flap valve was visualized endoscopically and       classified as Hill Grade II (fold present, opens with respiration).      No endoscopic abnormality was evident in the esophagus to explain the       patient's complaint of dysphagia.      The stomach was normal.      The cardia and gastric fundus were normal on retroflexion.      The examined duodenum was normal. Impression:               - Z-line regular, 44 cm from the incisors.                           - Gastroesophageal flap valve classified as Hill  Grade II (fold present, opens with respiration).                           - No endoscopic esophageal abnormality to explain                            patient's dysphagia.                           - Normal stomach.                           - Normal examined duodenum.                           - No specimens collected. Recommendation:           - Resume previous diet.                           - Continue present medications.                           - Follow an antireflux regimen.                           - Persistent GERD symptoms despite high dose PPI,                            failed Pantoprazole , Nexium and Zegrid. Will start                            Dexilant  30mg  daily before breakfast or  dinner                           - Excession bloating and diarrhea, suspicious for                            small intestinal bacterial overgrowth, start                            Rifaxamin 550mg  three times daily X 14 days                           - Follow up in GI office in 4-6 weeks Procedure Code(s):        --- Professional ---                           [phone], Esophagogastroduodenoscopy, flexible,                            transoral; diagnostic, including collection of                            specimen(s) by brushing or washing, when performed                            (  separate procedure) Diagnosis Code(s):        --- Professional ---                           R13.10, Dysphagia, unspecified                           R10.13, Epigastric pain                           K21.9, Gastro-esophageal reflux disease without                            esophagitis CPT copyright 2022 American [employer] Association. All rights reserved. The codes documented in this report are preliminary and upon coder review may  be revised to meet current compliance requirements. Jordan Pardini V. Thailan Sava, MD 11/10/2023 9:44:38 AM This report has been signed electronically. Number of Addenda: 0

## 2023-11-10 NOTE — Anesthesia Preprocedure Evaluation (Signed)
 Anesthesia Evaluation  Patient identified by MRN, date of birth, ID band Patient awake    Reviewed: Allergy & Precautions, H&P , NPO status , Patient's Chart, lab work & pertinent test results  History of Anesthesia Complications Negative for: history of anesthetic complications  Airway Mallampati: II  TM Distance: >3 FB Neck ROM: Full    Dental no notable dental hx.    Pulmonary neg pulmonary ROS   Pulmonary exam normal breath sounds clear to auscultation       Cardiovascular hypertension, (-) angina + CAD and + Past MI  Normal cardiovascular exam+ dysrhythmias Atrial Fibrillation + pacemaker  Rhythm:Regular Rate:Normal  IMPRESSIONS     1. Left ventricular ejection fraction, by estimation, is 55 to 60%. The  left ventricle has normal function. The left ventricle has no regional  wall motion abnormalities. Left ventricular diastolic function could not  be evaluated.   2. Right ventricular systolic function is mildly reduced. The right  ventricular size is mildly enlarged. There is normal pulmonary artery  systolic pressure. The estimated right ventricular systolic pressure is  33.4 mmHg.   3. Left atrial size was severely dilated.   4. Right atrial size was moderately dilated.   5. The mitral valve is degenerative. Mild mitral valve regurgitation. No  evidence of mitral stenosis. Moderate mitral annular calcification.   6. The aortic valve has been repaired/replaced. Aortic valve  regurgitation is not visualized. 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 Procedure Date: 03/15/2011. Echo findings  are consistent with normal structure and function   of the aortic valve prosthesis. Aortic valve area, by VTI measures 3.09  cm. Aortic valve mean gradient measures 9.0 mmHg. Aortic valve Vmax  measures 2.06 m/s.   7. There is moderate dilatation of the ascending aorta, measuring 44 mm.   8. The  inferior vena cava is dilated in size with >50% respiratory  variability, suggesting right atrial pressure of 8 mmHg.     Neuro/Psych CVA  negative psych ROS   GI/Hepatic Neg liver ROS,GERD  ,, GERD  Epigastric Pain   Endo/Other  Hypothyroidism    Renal/GU negative Renal ROS  negative genitourinary   Musculoskeletal  (+) Arthritis ,    Abdominal   Peds negative pediatric ROS (+)  Hematology negative hematology ROS (+)   Anesthesia Other Findings   Reproductive/Obstetrics negative OB ROS                              Anesthesia Physical Anesthesia Plan  ASA: 3  Anesthesia Plan: MAC   Post-op Pain Management:    Induction: Intravenous  PONV Risk Score and Plan: 1 and Propofol  infusion and Treatment may vary due to age or medical condition  Airway Management Planned: Natural Airway  Additional Equipment:   Intra-op Plan:   Post-operative Plan:   Informed Consent: I have reviewed the patients History and Physical, chart, labs and discussed the procedure including the risks, benefits and alternatives for the proposed anesthesia with the patient or authorized representative who has indicated his/her understanding and acceptance.     Dental advisory given  Plan Discussed with: CRNA  Anesthesia Plan Comments:          Anesthesia Quick Evaluation

## 2023-11-10 NOTE — Discharge Instructions (Signed)

## 2023-11-10 NOTE — Anesthesia Postprocedure Evaluation (Signed)
 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  Last Vitals:  Vitals:   11/10/23 0940 11/10/23 0950  BP: (!) 174/85 (!) 178/84  Pulse: 60 60  Resp: 14 14  Temp:    SpO2: 94% 95%    Last Pain:  Vitals:   11/10/23 0950  TempSrc:   PainSc: 0-No pain                 Thom JONELLE Peoples

## 2023-11-10 NOTE — Interval H&P Note (Signed)
 History and Physical Interval Note:  11/10/2023 8:30 AM  Kevin GORMAN Finn, MD  has presented today for surgery, with the diagnosis of Dyspepsia  GERD  Epigastric Pain.  The various methods of treatment have been discussed with the patient and family. After consideration of risks, benefits and other options for treatment, the patient has consented to  Procedure(s): EGD (ESOPHAGOGASTRODUODENOSCOPY) (N/A) as a surgical intervention.  The patient's history has been reviewed, patient examined, no change in status, stable for surgery.  I have reviewed the patient's chart and labs.  Questions were answered to the patient's satisfaction.     Vaishali Baise

## 2023-11-11 ENCOUNTER — Encounter (HOSPITAL_COMMUNITY): Payer: Self-pay | Admitting: Gastroenterology

## 2023-11-11 ENCOUNTER — Other Ambulatory Visit (HOSPITAL_BASED_OUTPATIENT_CLINIC_OR_DEPARTMENT_OTHER): Payer: Self-pay

## 2023-11-12 ENCOUNTER — Other Ambulatory Visit (HOSPITAL_BASED_OUTPATIENT_CLINIC_OR_DEPARTMENT_OTHER): Payer: Self-pay

## 2023-11-14 ENCOUNTER — Other Ambulatory Visit (HOSPITAL_COMMUNITY): Payer: Self-pay

## 2023-11-14 ENCOUNTER — Telehealth: Payer: Self-pay

## 2023-11-14 NOTE — Telephone Encounter (Signed)
 Pharmacy Patient Advocate Encounter   Received notification from CoverMyMeds that prior authorization for Xifaxan  550MG  tablets is required/requested.   Insurance verification completed.   The patient is insured through [facility] .   Prior Authorization form/request asks a question that requires your assistance. Please see the question below and advise accordingly. The PA will not be submitted until the necessary information is received.

## 2023-11-15 ENCOUNTER — Other Ambulatory Visit (HOSPITAL_COMMUNITY): Payer: Self-pay

## 2023-11-16 NOTE — Telephone Encounter (Signed)
 Irritable bowel syndrome with diarrhea.  I can do a telephone note if needed or addend procedure visit to document.  Please let me know which is preferred.  Thanks

## 2023-11-17 ENCOUNTER — Other Ambulatory Visit (HOSPITAL_BASED_OUTPATIENT_CLINIC_OR_DEPARTMENT_OTHER): Payer: Self-pay

## 2023-11-17 ENCOUNTER — Other Ambulatory Visit: Payer: Self-pay

## 2023-11-17 ENCOUNTER — Other Ambulatory Visit: Payer: Self-pay | Admitting: Internal Medicine

## 2023-11-17 MED ORDER — DIGOXIN 125 MCG PO TABS
0.1250 mg | ORAL_TABLET | Freq: Every day | ORAL | 3 refills | Status: AC
  Filled 2023-11-17: qty 90, 90d supply, fill #0
  Filled 2024-02-25: qty 90, 90d supply, fill #1

## 2023-11-17 NOTE — Telephone Encounter (Signed)
 Pharmacy Patient Advocate Encounter    Per test claim: PA required; PA submitted to above mentioned insurance via Latent Key/confirmation #/EOC [id number] Status is pending

## 2023-11-17 NOTE — Telephone Encounter (Signed)
 Noted pt. advised

## 2023-11-17 NOTE — Telephone Encounter (Signed)
 Pharmacy Patient Advocate Encounter  Received notification from [employer] that Prior Authorization for Xifaxan  550MG  tablets has been APPROVED from 11-17-2023 to 12-01-2023   PA #/Case ID/Reference #: [id number]

## 2023-11-18 ENCOUNTER — Other Ambulatory Visit (HOSPITAL_BASED_OUTPATIENT_CLINIC_OR_DEPARTMENT_OTHER): Payer: Self-pay

## 2023-11-23 ENCOUNTER — Other Ambulatory Visit: Payer: Self-pay | Admitting: Internal Medicine

## 2023-11-23 ENCOUNTER — Other Ambulatory Visit (HOSPITAL_BASED_OUTPATIENT_CLINIC_OR_DEPARTMENT_OTHER): Payer: Self-pay

## 2023-11-23 DIAGNOSIS — I209 Angina pectoris, unspecified: Secondary | ICD-10-CM

## 2023-11-23 MED ORDER — NITROGLYCERIN 0.4 MG SL SUBL
0.4000 mg | SUBLINGUAL_TABLET | SUBLINGUAL | 5 refills | Status: AC | PRN
  Filled 2024-01-31: qty 25, 7d supply, fill #0

## 2023-11-28 ENCOUNTER — Other Ambulatory Visit (HOSPITAL_BASED_OUTPATIENT_CLINIC_OR_DEPARTMENT_OTHER): Payer: Self-pay

## 2023-11-29 ENCOUNTER — Ambulatory Visit: Attending: Cardiovascular Disease

## 2023-11-29 DIAGNOSIS — I495 Sick sinus syndrome: Secondary | ICD-10-CM

## 2023-11-29 LAB — CUP PACEART INCLINIC DEVICE CHECK
Date Time Interrogation Session: 20250930135850
Implantable Lead Connection Status: 753985
Implantable Lead Connection Status: 753985
Implantable Lead Implant Date: 20170417
Implantable Lead Implant Date: 20170417
Implantable Lead Location: 753859
Implantable Lead Location: 753860
Implantable Lead Model: 7740
Implantable Lead Model: 7741
Implantable Lead Serial Number: 662696
Implantable Lead Serial Number: 751382
Implantable Pulse Generator Implant Date: 20170417
Pulse Gen Serial Number: 718418

## 2023-11-29 NOTE — Patient Instructions (Signed)
 Follow up as scheduled with Dr. Waddell.

## 2023-11-29 NOTE — Progress Notes (Signed)
 BS software upgrade completed.

## 2023-11-30 ENCOUNTER — Telehealth: Payer: Self-pay | Admitting: *Deleted

## 2023-11-30 ENCOUNTER — Other Ambulatory Visit (HOSPITAL_BASED_OUTPATIENT_CLINIC_OR_DEPARTMENT_OTHER): Payer: Self-pay

## 2023-11-30 NOTE — Telephone Encounter (Signed)
 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 Called him and left VM asking when he wants lab collected and what labs is he requesting.

## 2023-12-05 ENCOUNTER — Other Ambulatory Visit (HOSPITAL_BASED_OUTPATIENT_CLINIC_OR_DEPARTMENT_OTHER): Payer: Self-pay

## 2023-12-06 ENCOUNTER — Other Ambulatory Visit (HOSPITAL_BASED_OUTPATIENT_CLINIC_OR_DEPARTMENT_OTHER): Payer: Self-pay

## 2023-12-08 ENCOUNTER — Other Ambulatory Visit (HOSPITAL_BASED_OUTPATIENT_CLINIC_OR_DEPARTMENT_OTHER): Payer: Self-pay

## 2023-12-08 ENCOUNTER — Other Ambulatory Visit: Payer: Self-pay | Admitting: Cardiovascular Disease

## 2023-12-08 DIAGNOSIS — I4819 Other persistent atrial fibrillation: Secondary | ICD-10-CM

## 2023-12-08 MED ORDER — APIXABAN 5 MG PO TABS
5.0000 mg | ORAL_TABLET | Freq: Two times a day (BID) | ORAL | 1 refills | Status: AC
  Filled 2023-12-12: qty 180, 90d supply, fill #0
  Filled 2024-03-12: qty 180, 90d supply, fill #1

## 2023-12-08 NOTE — Telephone Encounter (Signed)
 Prescription refill request for Eliquis  received. Indication:afib Last office visit:4/25 Scr:1.17  8/25 Age: 89 Weight:93.9  kg  Prescription refilled

## 2023-12-09 ENCOUNTER — Ambulatory Visit: Payer: Medicare Other

## 2023-12-09 DIAGNOSIS — I4819 Other persistent atrial fibrillation: Secondary | ICD-10-CM

## 2023-12-09 NOTE — Progress Notes (Signed)
 Remote PPM Transmission

## 2023-12-12 ENCOUNTER — Other Ambulatory Visit: Payer: Self-pay | Admitting: Cardiovascular Disease

## 2023-12-12 ENCOUNTER — Other Ambulatory Visit: Payer: Self-pay | Admitting: Gastroenterology

## 2023-12-12 ENCOUNTER — Other Ambulatory Visit (HOSPITAL_BASED_OUTPATIENT_CLINIC_OR_DEPARTMENT_OTHER): Payer: Self-pay

## 2023-12-12 ENCOUNTER — Other Ambulatory Visit: Payer: Self-pay | Admitting: Internal Medicine

## 2023-12-12 DIAGNOSIS — M47812 Spondylosis without myelopathy or radiculopathy, cervical region: Secondary | ICD-10-CM | POA: Diagnosis not present

## 2023-12-12 DIAGNOSIS — Z79891 Long term (current) use of opiate analgesic: Secondary | ICD-10-CM | POA: Diagnosis not present

## 2023-12-12 DIAGNOSIS — M48062 Spinal stenosis, lumbar region with neurogenic claudication: Secondary | ICD-10-CM | POA: Diagnosis not present

## 2023-12-12 DIAGNOSIS — G894 Chronic pain syndrome: Secondary | ICD-10-CM | POA: Diagnosis not present

## 2023-12-12 MED ORDER — MAGNESIUM OXIDE 400 MG PO TABS
400.0000 mg | ORAL_TABLET | Freq: Every day | ORAL | 1 refills | Status: AC
  Filled 2023-12-12: qty 120, 120d supply, fill #0

## 2023-12-12 MED ORDER — HYDROCODONE-ACETAMINOPHEN 5-325 MG PO TABS
1.0000 | ORAL_TABLET | Freq: Three times a day (TID) | ORAL | 0 refills | Status: DC
  Filled 2023-12-12: qty 90, 30d supply, fill #0

## 2023-12-12 MED ORDER — BUTALBITAL-APAP-CAFFEINE 50-325-40 MG PO TABS
1.0000 | ORAL_TABLET | Freq: Three times a day (TID) | ORAL | 2 refills | Status: DC | PRN
  Filled 2023-12-12: qty 90, 30d supply, fill #0
  Filled 2023-12-29 – 2024-01-04 (×2): qty 90, 30d supply, fill #1

## 2023-12-12 MED ORDER — LINACLOTIDE 145 MCG PO CAPS
145.0000 ug | ORAL_CAPSULE | Freq: Every day | ORAL | 3 refills | Status: AC
  Filled 2023-12-12 – 2024-01-20 (×2): qty 90, 90d supply, fill #0

## 2023-12-13 ENCOUNTER — Other Ambulatory Visit: Payer: Self-pay

## 2023-12-13 ENCOUNTER — Other Ambulatory Visit (HOSPITAL_BASED_OUTPATIENT_CLINIC_OR_DEPARTMENT_OTHER): Payer: Self-pay

## 2023-12-13 LAB — CUP PACEART REMOTE DEVICE CHECK
Battery Remaining Longevity: 30 mo
Battery Remaining Percentage: 30 %
Brady Statistic RA Percent Paced: 1 %
Brady Statistic RV Percent Paced: 75 %
Date Time Interrogation Session: 20251014032900
Implantable Lead Connection Status: 753985
Implantable Lead Connection Status: 753985
Implantable Lead Implant Date: 20170417
Implantable Lead Implant Date: 20170417
Implantable Lead Location: 753859
Implantable Lead Location: 753860
Implantable Lead Model: 7740
Implantable Lead Model: 7741
Implantable Lead Serial Number: 662696
Implantable Lead Serial Number: 751382
Implantable Pulse Generator Implant Date: 20170417
Lead Channel Impedance Value: 676 Ohm
Lead Channel Impedance Value: 734 Ohm
Lead Channel Setting Pacing Amplitude: 2.5 V
Lead Channel Setting Pacing Amplitude: 2.5 V
Lead Channel Setting Pacing Pulse Width: 0.4 ms
Lead Channel Setting Sensing Sensitivity: 2.5 mV
Pulse Gen Serial Number: 718418
Zone Setting Status: 755011

## 2023-12-13 NOTE — Progress Notes (Signed)
 Remote PPM Transmission

## 2023-12-15 ENCOUNTER — Other Ambulatory Visit (HOSPITAL_BASED_OUTPATIENT_CLINIC_OR_DEPARTMENT_OTHER): Payer: Self-pay

## 2023-12-15 ENCOUNTER — Ambulatory Visit: Payer: Self-pay | Admitting: Internal Medicine

## 2023-12-15 LAB — BASIC METABOLIC PANEL WITH GFR

## 2023-12-15 LAB — HEPATIC FUNCTION PANEL

## 2023-12-15 LAB — COMPREHENSIVE METABOLIC PANEL WITH GFR

## 2023-12-15 LAB — CBC AND DIFFERENTIAL

## 2023-12-15 LAB — CBC

## 2023-12-15 MED ORDER — METOPROLOL SUCCINATE ER 25 MG PO TB24
75.0000 mg | ORAL_TABLET | Freq: Two times a day (BID) | ORAL | 1 refills | Status: AC
  Filled 2023-12-15 – 2024-01-20 (×2): qty 540, 90d supply, fill #0

## 2023-12-15 MED ORDER — CILOSTAZOL 100 MG PO TABS
100.0000 mg | ORAL_TABLET | Freq: Two times a day (BID) | ORAL | 1 refills | Status: AC
  Filled 2023-12-15 – 2024-01-20 (×3): qty 180, 90d supply, fill #0

## 2023-12-16 ENCOUNTER — Other Ambulatory Visit (HOSPITAL_BASED_OUTPATIENT_CLINIC_OR_DEPARTMENT_OTHER): Payer: Self-pay

## 2023-12-22 ENCOUNTER — Other Ambulatory Visit (HOSPITAL_BASED_OUTPATIENT_CLINIC_OR_DEPARTMENT_OTHER): Payer: Self-pay

## 2023-12-27 ENCOUNTER — Encounter: Payer: Self-pay | Admitting: Gastroenterology

## 2023-12-27 ENCOUNTER — Ambulatory Visit: Admitting: Gastroenterology

## 2023-12-27 VITALS — BP 130/72 | HR 61 | Ht 73.0 in | Wt 208.0 lb

## 2023-12-27 DIAGNOSIS — K581 Irritable bowel syndrome with constipation: Secondary | ICD-10-CM | POA: Diagnosis not present

## 2023-12-27 DIAGNOSIS — K219 Gastro-esophageal reflux disease without esophagitis: Secondary | ICD-10-CM

## 2023-12-27 DIAGNOSIS — R1032 Left lower quadrant pain: Secondary | ICD-10-CM

## 2023-12-27 NOTE — Patient Instructions (Addendum)
 VISIT SUMMARY:  You came in today for a follow-up on your chronic gastrointestinal issues, including abdominal pain, bloating, and post-meal chest pain. We also discussed your neck pain, constipation, and fluid retention.  YOUR PLAN:  CHRONIC LEFT LOWER QUADRANT ABDOMINAL PAIN: Persistent abdominal pain likely related to diverticular disease. -Continue your current bowel regimen to ensure regular bowel movements.  CHRONIC CONSTIPATION:  -Increase Miralax  dosage if needed to ensure soft and regular bowel movements.  CHRONIC HICCUPS: Hiccups triggered by eating or drinking, likely due to acid build-up. -Use Zegerid as needed before meals to manage acid build-up. -Avoid large meals and opt for smaller, frequent meals to reduce hiccup episodes.  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 -Consult with your cardiologist to optimize cardiac medications. -Use nitroglycerin  as needed for chest pain. -Adopt smaller, frequent meals to reduce cardiac strain.   EDEMA OF LOWER EXTREMITIES: Swelling in your legs, likely due to heart failure and fluid retention. -Consult with your cardiologist to optimize diuretic therapy, possibly increase Lasix .  I appreciate the  opportunity to care for you  Thank You   Kavitha Nandigam , MD

## 2023-12-27 NOTE — Progress Notes (Unsigned)
 Kevin GORMAN Finn, MD    [id number]    12/10/1934  Primary Care Physician:Gupta, Fredia CROME, MD  Referring Physician: Charlanne Fredia CROME, MD [address] [place],  [place] [address]   Chief complaint:  LLQ abd pain, hiccups  Discussed the use of AI scribe software for clinical note transcription with the patient, who gave verbal consent to proceed.  History of Present Illness Kevin GORMAN Finn, MD [name] is an 89 year old male with chronic gastrointestinal issues who presents for a follow-up on his GI symptoms.  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 - Social embarrassment due to abdominal growling  Postprandial chest pain and weakness - Chest pain and weakness occur after eating, especially following large meals - Pain radiates to neck and wrist; hands become cold - Symptoms associated with exertion and large meals - Hiccups and chest pain occur post-meal - Uses Zegerid as needed for acid reflux, which is effective  Neck pain with radiculopathy - Severe neck pain radiates down arm and shoulder - Pain limits ability to walk more than ten feet without excruciating pain - Manages pain with narcotics; dosage reduced from 10 mg to 5 mg due to side effects  Constipation and fluid retention - History of constipation and fluid retention - Manages constipation with Miralax  - Regular bowel movements, but occasional tenderness in left lower abdomen   Clinical history includes MGUS, A-fib on Eliquis , s/p AVR, CAD, CHF, history of renal artery stenosis on Pletal   EGD November 10, 2023 - Z- line regular, 44 cm from the incisors. -  Gastroesophageal flap valve classified as Hill Grade II ( fold present, opens with respiration) . - No endoscopic esophageal abnormality to explain patient' s dysphagia. - Normal stomach. - Normal examined duodenum.   CT Angio Abdomen Pelvis w/wo contrast 12-13-22 -VASCULAR Atherosclerosis of abdominal aorta is noted without aneurysm or dissection. No significant mesenteric or renal artery stenosis is noted. Aortic Atherosclerosis (ICD10-I70.0). -NON-VASCULAR Diverticulosis of descending and sigmoid colon is noted. Minimal inflammatory changes are noted involving the proximal sigmoid colon which were present on prior exam and may represent chronic sequela of previous inflammation, although acute inflammation cannot be excluded.   Stable moderate prostatic enlargement.   CT abdomen pelvis November 05, 2022 without contrast 1. Minimal stranding in the proximal sigmoid colon compatible with acute diverticulitis. No free air or free fluid is present. 2. Cholelithiasis without evidence of cholecystitis. 3. Stable 5 cm simple cyst at the lower pole of the right kidney. No follow-up imaging is recommended. JACR 2018 Feb; 264-273, Management of the Incidental Renal Mass on CT, RadioGraphics 2021; 814-848, Bosniak Classification of Cystic Renal Masses, Version 2019. 4. Cardiomegaly without failure. 5. Coronary artery disease. 6.  Aortic Atherosclerosis (ICD10-I70.0).   CT abdomen pelvis without contrast September 30, 2022 1. There is mild bowel wall thickening and faint adjacent fat stranding at the confluence of the sigmoid colon and descending colon. This could reflect early acute uncomplicated diverticulitis. 2. Bladder wall is circumferentially thickened with a trabeculated appearance. This could reflect sequela of chronic outlet obstruction but given faint adjacent fat stranding, recommend correlation with urinalysis to exclude superimposed infection. 3. Massive prostatomegaly.  CT  Abdomen Pelvis w contrast 08-12-21  1. No acute CT findings of the abdomen or pelvis to explain lower abdominal pain. 2. Descending and sigmoid diverticulosis without evidence of acute diverticulitis. 3. Severe prostatomegaly. 4. Tiny gallstones and or sludge in the gallbladder. No evidence of acute cholecystitis. 5. Coronary artery disease.   CT Abdomen Pelvis wo contrast 10-18-19 -Mild to moderate acute sigmoid diverticulitis. No evidence of abscess or other complication. -Stable enlarged prostate and findings of chronic bladder outlet obstruction.  -Cholelithiasis. No radiographic evidence of cholecystitis.     Outpatient Encounter Medications as of 12/27/2023  Medication Sig   amLODipine  (NORVASC ) 2.5 MG tablet Take 2.5 mg by mouth as needed. (Patient taking differently: Take 2.5 mg by mouth every morning.)   amLODipine  (NORVASC ) 5 MG tablet Take 1 tablet (5 mg total) by mouth daily.   apixaban  (ELIQUIS ) 5 MG TABS tablet Take 1 tablet (5 mg total) by mouth 2 (two) times daily.   butalbital -acetaminophen -caffeine  (BAC, BUTALBITAL -ACETAMIN-CAFF,) 50-325-40 MG tablet Take 1 tablet by mouth 3 (three) times daily as needed. (Aware of acetaminophen  in Norco as well)   cholecalciferol  (VITAMIN D3) 25 MCG (1000 UNIT) tablet Take 1,000 Units by mouth daily.   cilostazol  (PLETAL ) 100 MG tablet Take 1 tablet (100 mg total) by mouth 2 (two) times daily.   Coenzyme Q10 200 MG TABS Take 200 mg by mouth daily.   digoxin  (LANOXIN ) 0.125 MG tablet Take 1 tablet (0.125 mg total) by mouth daily.   doxazosin  (CARDURA ) 4 MG tablet Take 1 tablet (4 mg total) by mouth daily.   ezetimibe  (ZETIA ) 10 MG tablet Take 1 tablet (10 mg total) by mouth daily.   HYDROcodone -acetaminophen  (NORCO/VICODIN) 5-325 MG tablet Take 1 tablet by mouth 3 (three) times daily. (Stop hydrocodone /apap 7.5/325)   hydrocortisone  (ANUSOL -HC) 25 MG suppository Place 1 suppository (25 mg total) rectally at bedtime as needed for  hemorrhoids or anal itching.   lansoprazole  (PREVACID ) 30 MG capsule Take 1 capsule (30 mg total) by mouth daily at 12 noon.   linaclotide  (LINZESS ) 145 MCG CAPS capsule Take 1 capsule (145 mcg total) by mouth daily before breakfast.   LORazepam  (ATIVAN ) 1 MG tablet Take 1 tablet (1 mg) by mouth at bedtime, and 0.5 tablet (0.5 mg) daily as needed for anxiety.   magnesium  oxide (MAG-OX) 400 MG tablet Take 1 tablet (400 mg total) by mouth daily.   metoprolol  succinate (TOPROL  XL) 25 MG 24 hr tablet Take 3 tablets (75 mg total) by mouth 2 (two) times daily.   Multiple Vitamins-Minerals (MULTIVITAMIN WITH MINERALS) tablet Take 1 tablet by mouth daily.   nitroGLYCERIN  (NITROSTAT ) 0.4 MG SL tablet Place 1 tablet (0.4 mg total) under the tongue every 5 (five) minutes as needed for chest pain.   Polyethyl Glycol-Propyl Glycol (SYSTANE OP) Place 1 drop into both eyes daily as needed (dry eyes).   polyethylene glycol (MIRALAX  / GLYCOLAX ) 17 g packet Take 17 g by mouth daily. Patient taking 1-2 everyday   potassium chloride  SA (KLOR-CON  M) 20 MEQ tablet Take 1 tablet (20 mEq total) by mouth daily.   predniSONE  (DELTASONE ) 5 MG tablet Take 1 tablet (5 mg total) by mouth daily with breakfast.   rosuvastatin  (CRESTOR ) 20 MG tablet Take 1 tablet (20 mg total) by mouth at bedtime. Please keep scheduled appointment for future refills.   butalbital -acetaminophen -caffeine  (FIORICET ) 50-325-40 MG tablet Take 1 tablet by mouth three times a day as needed pt aware of acetaminophen  in Norco as well (Patient  not taking: Reported on 12/27/2023)   desoximetasone  (TOPICORT ) 0.25 % cream Apply 1 Application topically 2 (two) times daily. (Patient not taking: Reported on 12/27/2023)   diclofenac Sodium (VOLTAREN) 1 % GEL Apply 2 g topically 2 (two) times daily as needed (knee pain). (Patient not taking: Reported on 12/27/2023)   furosemide  (LASIX ) 20 MG tablet Take 20 mg by mouth daily as needed for fluid. (Patient not taking:  Reported on 12/27/2023)   HYDROcodone -acetaminophen  (NORCO) 7.5-325 MG tablet Take 1 tablet by mouth 3 (three) times daily as directed. Stop hydrocodone /apap 10/325 (Patient not taking: Reported on 12/27/2023)   HYDROcodone -acetaminophen  (NORCO) 7.5-325 MG tablet Take 1 tablet by mouth 3 (three) times daily as directed stop hydrocodone /apap 10/325 (Patient not taking: Reported on 12/27/2023)   sennosides-docusate sodium  (SENOKOT-S) 8.6-50 MG tablet Take 1 tablet by mouth 2 (two) times daily. (Patient not taking: Reported on 12/27/2023)   telmisartan  (MICARDIS ) 20 MG tablet Take 1 tablet (20 mg total) by mouth daily. (Patient not taking: Reported on 12/27/2023)   [DISCONTINUED] pregabalin (LYRICA) 75 MG capsule Take 75 mg by mouth 2 (two) times daily.   No facility-administered encounter medications on file as of 12/27/2023.    Allergies as of 12/27/2023 - Review Complete 12/27/2023  Allergen Reaction Noted   Contrast media [iodinated contrast media] Hives 02/01/2011   Gadolinium derivatives Hives 10/09/2012   Iodine-131 Hives 10/07/2021   Metrizamide Hives 02/01/2011   Tetanus toxoid-containing vaccines  10/07/2021    Past Medical History:  Diagnosis Date   Aortic stenosis    moderate aortic stenosis   Arthritis    Benign prostatic hypertrophy    Chronic systolic heart failure (HCC)    Chronotropic incompetence with sinus node dysfunction    Status post Guidant dual-mode, dual-pacing, dual-sensing  pacemaker   implantation now programmed to AAI with recent generator change.   Coronary artery disease    status post multiple prior percutaneous coronary interventions, microvascular angina per Dr Obie   Diverticulitis sigmoid colon recurrent    Dysfunctional autonomic nervous system    Dyspnea    Heart murmur    History of primary hypertension    Hypercoagulable state    chronically anticoagulated with coumadin    Hyperlipidemia    Hyperthyroidism    Hypothyroidism    Dr.  Mirna   MGUS (monoclonal gammopathy of unknown significance) 02/17/2013   Ocular myasthenia    Osteoarthritis    Paroxysmal atrial fibrillation (HCC)    DR. Morris,    Prediabetes 09/21/2017   Stroke (HCC)    1990    Past Surgical History:  Procedure Laterality Date   AORTIC VALVE REPLACEMENT  03/15/2011   Procedure: AORTIC VALVE REPLACEMENT (AVR);  Surgeon: Dorise MARLA Fellers, MD;  Location: [facility] OR;  Service: Open Heart Surgery;  Laterality: N/A;   APPENDECTOMY     CARDIAC CATHETERIZATION     13   CARDIOVERSION     CARDIOVERSION  04/15/2011   Procedure: CARDIOVERSION;  Surgeon: Ezra Shuck, MD;  Location: [facility] ENDOSCOPY;  Service: Cardiovascular;  Laterality: N/A;   CARDIOVERSION N/A 09/11/2014   Procedure: CARDIOVERSION;  Surgeon: Wilbert JONELLE Bihari, MD;  Location: [facility] ENDOSCOPY;  Service: Cardiovascular;  Laterality: N/A;   CARDIOVERSION N/A 06/27/2015   Procedure: CARDIOVERSION;  Surgeon: Aleene JINNY Passe, MD;  Location: [facility] ENDOSCOPY;  Service: Cardiovascular;  Laterality: N/A;   CARDIOVERSION N/A 07/04/2015   Procedure: CARDIOVERSION;  Surgeon: Danelle LELON Birmingham, MD;  Location: [facility] ENDOSCOPY;  Service: Cardiovascular;  Laterality: N/A;   CARDIOVERSION  N/A 04/13/2017   Procedure: CARDIOVERSION;  Surgeon: Shlomo Wilbert SAUNDERS, MD;  Location: [facility] ENDOSCOPY;  Service: Cardiovascular;  Laterality: N/A;   COLONOSCOPY     CORONARY STENT INTERVENTION N/A 10/15/2019   Procedure: CORONARY STENT INTERVENTION;  Surgeon: Dann Candyce RAMAN, MD;  Location: [facility] INVASIVE CV LAB;  Service: Cardiovascular;  Laterality: N/A;   EP IMPLANTABLE DEVICE N/A 06/16/2015   Procedure: Pacemaker Implant;  Surgeon: Danelle LELON Birmingham, MD;  Location: [facility] INVASIVE CV LAB;  Service: Cardiovascular;  Laterality: N/A;   ESOPHAGOGASTRODUODENOSCOPY N/A 11/10/2023   Procedure: EGD (ESOPHAGOGASTRODUODENOSCOPY);  Surgeon: Timica Marcom V, MD;  Location: [facility] ENDOSCOPY;  Service: Gastroenterology;  Laterality: N/A;   hemrrhoidectomy      LEFT AND RIGHT HEART CATHETERIZATION WITH CORONARY ANGIOGRAM Bilateral 02/01/2011   Procedure: LEFT AND RIGHT HEART CATHETERIZATION WITH CORONARY ANGIOGRAM;  Surgeon: Debby JONETTA Como, MD;  Location: [facility] CATH LAB;  Service: Cardiovascular;  Laterality: Bilateral;   LEFT HEART CATH AND CORONARY ANGIOGRAPHY N/A 10/15/2019   Procedure: LEFT HEART CATH AND CORONARY ANGIOGRAPHY;  Surgeon: Dann Candyce RAMAN, MD;  Location: [facility] INVASIVE CV LAB;  Service: Cardiovascular;  Laterality: N/A;   LEFT HEART CATH AND CORONARY ANGIOGRAPHY N/A 10/07/2020   Procedure: LEFT HEART CATH AND CORONARY ANGIOGRAPHY;  Surgeon: Wonda Sharper, MD;  Location: [facility] INVASIVE CV LAB;  Service: Cardiovascular;  Laterality: N/A;   LEFT HEART CATH AND CORONARY ANGIOGRAPHY N/A 07/06/2022   Procedure: LEFT HEART CATH AND CORONARY ANGIOGRAPHY;  Surgeon: Wonda Sharper, MD;  Location: [facility] INVASIVE CV LAB;  Service: Cardiovascular;  Laterality: N/A;   LUMBAR LAMINECTOMY/DECOMPRESSION MICRODISCECTOMY Right 02/23/2021   Procedure: Right Lumbar four-five microdiscectomy;  Surgeon: Joshua Alm RAMAN, MD;  Location: [facility] OR;  Service: Neurosurgery;  Laterality: Right;   MAZE  03/15/2011   Procedure: MAZE;  Surgeon: Dorise MARLA Fellers, MD;  Location: [facility] OR;  Service: Open Heart Surgery;  Laterality: N/A;   MOHS SURGERY  09/2021   PACEMAKER INSERTION  1991   Guidant PPM, most recent Generator Change by Dr Obie was 08/22/06   RIGHT/LEFT HEART CATH AND CORONARY ANGIOGRAPHY N/A 07/06/2016   Procedure: Right/Left Heart Cath and Coronary Angiography;  Surgeon: Wonda Sharper, MD;  Location: [facility] INVASIVE CV LAB;  Service: Cardiovascular;  Laterality: N/A;   TEE WITHOUT CARDIOVERSION  04/15/2011   Procedure: TRANSESOPHAGEAL ECHOCARDIOGRAM (TEE);  Surgeon: Ezra Shuck, MD;  Location: [facility] ENDOSCOPY;  Service: Cardiovascular;  Laterality: N/A;   TEE WITHOUT CARDIOVERSION N/A 09/11/2014   Procedure: TRANSESOPHAGEAL ECHOCARDIOGRAM (TEE);  Surgeon: Wilbert SAUNDERS Shlomo, MD;  Location: [facility] ENDOSCOPY;  Service: Cardiovascular;  Laterality: N/A;    Family History  Problem Relation Age of Onset   Heart disease Brother        Twin brother has coronary disease and recent AVR for AS   CAD Brother    Atrial fibrillation Brother    Lymphoma Brother    Sarcoidosis Brother    Depression Daughter    Anorexia nervosa Daughter    Hypertension Son    Anesthesia problems Neg Hx    Hypotension Neg Hx    Malignant hyperthermia Neg Hx    Pseudochol deficiency Neg Hx     Social History   Socioeconomic History   Marital status: Married    Spouse name: [name]    Number of children: 3   Years of education: Not on file   Highest education level: Not on file  Occupational History   Occupation: Retired    Comment: Physician  Tobacco Use  Smoking status: Never   Smokeless tobacco: Never  Vaping Use   Vaping status: Never Used  Substance and Sexual Activity   Alcohol use: No    Alcohol/week: 0.0 standard drinks of alcohol   Drug use: No   Sexual activity: Not Currently  Other Topics Concern   Not on file  Social History Narrative   Married to [place]. Has grown children   Retired proofreader MD      Never smoker no alcohol      Social Drivers of Corporate Investment Banker Strain: Not on file  Food Insecurity: No Food Insecurity (01/08/2023)   Hunger Vital Sign    Worried About Running Out of Food in the Last Year: Never true    Ran Out of Food in the Last Year: Never true  Transportation Needs: No Transportation Needs (01/08/2023)   PRAPARE - Administrator, Civil Service (Medical): No    Lack of Transportation (Non-Medical): No  Physical Activity: Not on file  Stress: Not on file  Social Connections: Not on file  Intimate Partner Violence: Not At Risk (01/08/2023)   Humiliation, Afraid, Rape, and Kick questionnaire    Fear of Current or Ex-Partner: No    Emotionally Abused: No    Physically Abused: No    Sexually Abused: No       Review of systems: All other review of systems negative except as mentioned in the HPI.   Physical Exam: Vitals:   12/27/23 1420  BP: 130/72  Pulse: 61   Body mass index is 27.44 kg/m. Gen:      No acute distress HEENT:  sclera anicteric Lungs: B/l clear. Abd:      soft, non-tender; no palpable masses, no distension Ext:    No edema Neuro: alert and oriented x 3 Psych: normal mood and affect  Data Reviewed:  Reviewed labs, radiology imaging, old records and pertinent past GI work up   Assessment and Plan Assessment & Plan Chronic left lower quadrant abdominal pain due to diverticular disease Chronic left lower quadrant abdominal pain, likely related to diverticular disease. Pain is persistent with no signs of perforation or acute complications. - Continue current bowel regimen to ensure regular bowel movements  Chronic constipation Chronic constipation managed with polyethylene glycol. Bowel movements are regular but sometimes not substantial. No signs of obstruction or severe constipation. - Increase Miralax  dosage if needed to ensure soft and regular bowel movements  Chronic hiccups Chronic hiccups triggered by ingestion of liquids or food, likely related to diaphragmatic stimulation and acid build-up in the stomach. - Use Zegerid as needed before meals to manage acid build-up - Avoid large meals and opt for smaller, frequent meals to reduce hiccup episodes  Angina due to coronary artery disease and congestive heart failure Angina precipitated by exertion and large meals, consistent with coronary artery disease and congestive heart failure. Symptoms include chest pain, burning sensation radiating down the left arm with numbness and cold hands.  - Consult with cardiologist to optimize cardiac medications - Use nitroglycerin  as needed for angina - Adopt smaller, frequent meals to reduce cardiac strain   Edema of lower extremities Edema present in lower  extremities, likely related to congestive heart failure and fluid retention. - Consult with cardiologist to optimize diuretic therapy, possibly increase Lasix      This visit required 40 minutes of patient care (this includes precharting, chart review, review of results, face-to-face time used for counseling as well as treatment plan and follow-up.  The patient was provided an opportunity to ask questions and all were answered. The patient agreed with the plan and demonstrated an understanding of the instructions.  [name] , MD    CC: Charlanne Fredia CROME, MD

## 2023-12-28 ENCOUNTER — Encounter: Payer: Self-pay | Admitting: Gastroenterology

## 2023-12-29 ENCOUNTER — Other Ambulatory Visit: Payer: Self-pay

## 2023-12-29 ENCOUNTER — Other Ambulatory Visit (HOSPITAL_BASED_OUTPATIENT_CLINIC_OR_DEPARTMENT_OTHER): Payer: Self-pay

## 2023-12-29 ENCOUNTER — Other Ambulatory Visit: Payer: Self-pay | Admitting: Internal Medicine

## 2023-12-29 MED ORDER — LORAZEPAM 1 MG PO TABS
1.0000 mg | ORAL_TABLET | Freq: Two times a day (BID) | ORAL | 5 refills | Status: AC | PRN
  Filled 2023-12-29: qty 60, 30d supply, fill #0
  Filled 2024-01-29: qty 60, 30d supply, fill #1
  Filled 2024-04-02: qty 60, 30d supply, fill #2

## 2023-12-29 NOTE — Telephone Encounter (Signed)
 Patient is requesting a refill of the following medications: Requested Prescriptions   Pending Prescriptions Disp Refills   LORazepam  (ATIVAN ) 1 MG tablet 60 tablet 5    Sig: Take 1 tablet (1 mg) by mouth at bedtime, and 0.5 tablet (0.5 mg) daily as needed for anxiety.    Date of last approval: 05/25/23  Refill amount: 60/5  Treatment agreement date: 02/01/23

## 2023-12-30 ENCOUNTER — Other Ambulatory Visit: Payer: Self-pay

## 2023-12-30 ENCOUNTER — Other Ambulatory Visit (HOSPITAL_BASED_OUTPATIENT_CLINIC_OR_DEPARTMENT_OTHER): Payer: Self-pay

## 2024-01-02 ENCOUNTER — Other Ambulatory Visit (HOSPITAL_BASED_OUTPATIENT_CLINIC_OR_DEPARTMENT_OTHER): Payer: Self-pay

## 2024-01-02 DIAGNOSIS — Z23 Encounter for immunization: Secondary | ICD-10-CM | POA: Diagnosis not present

## 2024-01-02 MED ORDER — FLUZONE HIGH-DOSE 0.5 ML IM SUSY
0.5000 mL | PREFILLED_SYRINGE | Freq: Once | INTRAMUSCULAR | 0 refills | Status: AC
  Filled 2024-01-02: qty 0.5, 1d supply, fill #0

## 2024-01-09 ENCOUNTER — Other Ambulatory Visit (HOSPITAL_BASED_OUTPATIENT_CLINIC_OR_DEPARTMENT_OTHER): Payer: Self-pay

## 2024-01-09 ENCOUNTER — Encounter: Payer: Self-pay | Admitting: Cardiovascular Disease

## 2024-01-09 ENCOUNTER — Ambulatory Visit: Attending: Cardiovascular Disease | Admitting: Cardiovascular Disease

## 2024-01-09 VITALS — BP 132/74 | HR 60 | Ht 73.0 in | Wt 208.0 lb

## 2024-01-09 DIAGNOSIS — M79671 Pain in right foot: Secondary | ICD-10-CM | POA: Diagnosis not present

## 2024-01-09 DIAGNOSIS — I5032 Chronic diastolic (congestive) heart failure: Secondary | ICD-10-CM | POA: Diagnosis not present

## 2024-01-09 DIAGNOSIS — I25119 Atherosclerotic heart disease of native coronary artery with unspecified angina pectoris: Secondary | ICD-10-CM | POA: Insufficient documentation

## 2024-01-09 DIAGNOSIS — I10 Essential (primary) hypertension: Secondary | ICD-10-CM | POA: Insufficient documentation

## 2024-01-09 DIAGNOSIS — Z952 Presence of prosthetic heart valve: Secondary | ICD-10-CM | POA: Insufficient documentation

## 2024-01-09 DIAGNOSIS — I5030 Unspecified diastolic (congestive) heart failure: Secondary | ICD-10-CM | POA: Diagnosis not present

## 2024-01-09 DIAGNOSIS — I4819 Other persistent atrial fibrillation: Secondary | ICD-10-CM | POA: Insufficient documentation

## 2024-01-09 MED ORDER — ISOSORBIDE MONONITRATE ER 30 MG PO TB24
15.0000 mg | ORAL_TABLET | Freq: Every day | ORAL | 3 refills | Status: AC
  Filled 2024-01-09: qty 45, 90d supply, fill #0
  Filled 2024-03-22: qty 45, 90d supply, fill #1

## 2024-01-09 NOTE — Assessment & Plan Note (Signed)
 No clear evidence of volume overload.  The patient request to be evaluated by Dr. Cherrie.  Will make a referral.

## 2024-01-09 NOTE — Patient Instructions (Signed)
 Medication Instructions:  START Isosorbide  Mononitrate (Imdur ) 15 mg once daily   *If you need a refill on your cardiac medications before your next appointment, please call your pharmacy*  Lab Work: None ordered today. If you have labs (blood work) drawn today and your tests are completely normal, you will receive your results only by: MyChart Message (if you have MyChart) OR A paper copy in the mail If you have any lab test that is abnormal or we need to change your treatment, we will call you to review the results.  Testing/Procedures: Your physician has requested that you have an ankle brachial index (ABI). During this test an ultrasound and blood pressure cuff are used to evaluate the arteries that supply the arms and legs with blood. Allow thirty minutes for this exam. There are no restrictions or special instructions.  Please note: We ask at that you not bring children with you during ultrasound (echo/ vascular) testing. Due to room size and safety concerns, children are not allowed in the ultrasound rooms during exams. Our front office staff cannot provide observation of children in our lobby area while testing is being conducted. An adult accompanying a patient to their appointment will only be allowed in the ultrasound room at the discretion of the ultrasound technician under special circumstances. We apologize for any inconvenience.   Follow-Up: At [facility], you and your health needs are our priority.  As part of our continuing mission to provide you with exceptional heart care, our providers are all part of one team.  This team includes your primary Cardiologist (physician) and Advanced Practice Providers or APPs (Physician Assistants and Nurse Practitioners) who all work together to provide you with the care you need, when you need it.  Your next appointment:   6 month(s)  Provider:   Ozell Fell, MD    We recommend signing up for the patient portal called  MyChart.  Sign up information is provided on this After Visit Summary.  MyChart is used to connect with patients for Virtual Visits (Telemedicine).  Patients are able to view lab/test results, encounter notes, upcoming appointments, etc.  Non-urgent messages can be sent to your provider as well.   To learn more about what you can do with MyChart, go to [email or web address].   Other Instructions You have been referred to our heart failure clinic with Dr. Cherrie. Their office will be reaching out to schedule this appointment for you.

## 2024-01-09 NOTE — Assessment & Plan Note (Signed)
 Blood pressure controlled on current medications.  Continue the same.

## 2024-01-09 NOTE — Assessment & Plan Note (Signed)
 Normal function of his aortic valve prosthesis on most recent echo.  LVEF is normal at 55 to 60%, RV function is mildly reduced, estimated RVSP is 33 mmHg, and the mean transaortic gradient is 9 mmHg.

## 2024-01-09 NOTE — Progress Notes (Signed)
 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  Cardiac catheterization Jul 06, 2022 showed patency of the stents in the left circumflex and RCA with no obstructive disease as well as normal LVEDP.  Echocardiogram May 12, 2023 showed normal LVEF of 55 to 60%, severe left atrial enlargement, mild mitral regurgitation, normal function of the patient's bioprosthetic aortic valve, and no significant changes from the prior echo study.  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  He continues to have issues with postprandial angina.  He states that sometimes his anginal symptoms will last for up to 2 hours.  No major change in his postprandial angina over the last 1 to 2 years as this has been a significant problem for him over that time period.  He has exertional dyspnea with fairly low-level activity.  No orthopnea or PND.  He does report leg edema right greater than left.  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  He has not really responded to diuretic therapy in the past.     Current Medications: Current Meds  Medication Sig   amLODipine  (NORVASC ) 2.5 MG tablet Take 2.5 mg by mouth as needed. (Patient taking differently: Take 2.5 mg by mouth every morning.)   amLODipine  (NORVASC ) 5 MG tablet Take 1 tablet (5 mg total) by mouth daily.   apixaban  (ELIQUIS ) 5 MG TABS tablet Take 1 tablet (5 mg total) by mouth 2 (two) times  daily.   butalbital -acetaminophen -caffeine  (FIORICET ) 50-325-40 MG tablet Take 1 tablet by mouth three times a day as needed pt aware of acetaminophen  in Norco as well   cholecalciferol  (VITAMIN D3) 25 MCG (1000 UNIT) tablet Take 1,000 Units by mouth daily.   cilostazol  (PLETAL ) 100 MG tablet Take 1 tablet (100 mg total) by mouth 2 (two) times daily.   Coenzyme Q10 200 MG TABS Take 200 mg by mouth daily.   desoximetasone  (TOPICORT ) 0.25 % cream Apply 1 Application topically 2 (two) times daily.   diclofenac Sodium (VOLTAREN) 1 % GEL Apply 2 g topically 2 (two) times daily as needed (knee pain).   digoxin  (LANOXIN ) 0.125 MG tablet Take 1 tablet (0.125 mg total) by mouth daily.   doxazosin  (CARDURA ) 4 MG tablet Take 1 tablet (4 mg total) by mouth daily.   ezetimibe  (ZETIA ) 10 MG tablet Take 1 tablet (10 mg total) by mouth daily.   furosemide  (LASIX ) 20 MG tablet Take 20 mg by mouth daily as needed for fluid.   HYDROcodone -acetaminophen  (NORCO/VICODIN) 5-325 MG tablet Take 1 tablet by mouth 3 (three) times daily. (Stop hydrocodone /apap 7.5/325)   hydrocortisone  (ANUSOL -HC) 25 MG suppository Place 1 suppository (25 mg total) rectally at bedtime as needed for hemorrhoids or anal itching.   isosorbide  mononitrate (IMDUR ) 30 MG 24 hr tablet Take 0.5 tablets (15 mg total) by mouth daily.   lansoprazole  (PREVACID ) 30 MG capsule Take 1 capsule (30  mg total) by mouth daily at 12 noon.   linaclotide  (LINZESS ) 145 MCG CAPS capsule Take 1 capsule (145 mcg total) by mouth daily before breakfast.   LORazepam  (ATIVAN ) 1 MG tablet Take 1 tablet (1 mg) by mouth at bedtime, and 0.5 tablet (0.5 mg) daily as needed for anxiety.   magnesium  oxide (MAG-OX) 400 MG tablet Take 1 tablet (400 mg total) by mouth daily.   metoprolol  succinate (TOPROL  XL) 25 MG 24 hr tablet Take 3 tablets (75 mg total) by mouth 2 (two) times daily.   Multiple Vitamins-Minerals (MULTIVITAMIN WITH MINERALS) tablet Take 1 tablet by mouth daily.    nitroGLYCERIN  (NITROSTAT ) 0.4 MG SL tablet Place 1 tablet (0.4 mg total) under the tongue every 5 (five) minutes as needed for chest pain.   Polyethyl Glycol-Propyl Glycol (SYSTANE OP) Place 1 drop into both eyes daily as needed (dry eyes).   polyethylene glycol (MIRALAX  / GLYCOLAX ) 17 g packet Take 17 g by mouth daily. Patient taking 1-2 everyday   potassium chloride  SA (KLOR-CON  M) 20 MEQ tablet Take 1 tablet (20 mEq total) by mouth daily.   predniSONE  (DELTASONE ) 5 MG tablet Take 1 tablet (5 mg total) by mouth daily with breakfast.   rosuvastatin  (CRESTOR ) 20 MG tablet Take 1 tablet (20 mg total) by mouth at bedtime. Please keep scheduled appointment for future refills.   sennosides-docusate sodium  (SENOKOT-S) 8.6-50 MG tablet Take 1 tablet by mouth 2 (two) times daily.   telmisartan  (MICARDIS ) 20 MG tablet Take 1 tablet (20 mg total) by mouth daily.   [DISCONTINUED] HYDROcodone -acetaminophen  (NORCO) 7.5-325 MG tablet Take 1 tablet by mouth 3 (three) times daily as directed stop hydrocodone /apap 10/325     Allergies:   Contrast media [iodinated contrast media], Gadolinium derivatives, Iodine-131, Metrizamide, and Tetanus toxoid-containing vaccines   ROS:   Please see the history of present illness.    All other systems reviewed and are negative.  EKGs/Labs/Other Studies Reviewed:    The following studies were reviewed today: Cardiac Studies & Procedures   ______________________________________________________________________________________________ CARDIAC CATHETERIZATION  CARDIAC CATHETERIZATION 07/06/2022  Conclusion   Prox RCA lesion is 30% stenosed.   Mid RCA lesion is 40% stenosed.   Mid Cx to Dist Cx lesion is 20% stenosed.   Ost LAD to Prox LAD lesion is 40% stenosed.   Non-stenotic Dist RCA lesion was previously treated.   LV end diastolic pressure is normal.  Widely patent left main with no stenosis Nonobstructive proximal LAD stenosis unchanged from prior cath  studies Widely patent LCx stent with no significant stenosis in the LCx distribution Patent RCA with mild nonobstructive disease and patent distal RCA stent Normal LVEDP  Recommend: continue antianginal Rx and medical treatment of nonobstructive CAD. Resume apixaban  tomorrow morning.  Findings Coronary Findings Diagnostic  Dominance: Right  Left Main Vessel is angiographically normal. Patent left main with mild calcification and no stenosis. Large vessel. Divides into the LAD, LCx, and intermediate/high OM branch.  Left Anterior Descending Vessel is large. There is mild diffuse disease throughout the vessel. Patent vessel, mild-moderate diffuse proximal plaquing with no high-grade stenosis, unchanged from the prior cath study. Ost LAD to Prox LAD lesion is 40% stenosed.  Left Circumflex The circumflex is patent with no significant stenosis. The stent remains widely patent with mild diffuse ISR. The OM branches are patent. Mid Cx to Dist Cx lesion is 20% stenosed. The lesion was previously treated over 2 years ago. Stented segment in the LCx remains patent with no significant stenosis  Right Coronary Artery There is mild diffuse disease throughout the vessel. Large, dominant vessel. There are diffuse irregularities with mild stenoses in the proximal and mid vessel. The distal vessel is widely patent. The PDA and PLA branches are patent. The stented segment in the distal RCA is patent with no stenosis. The PDA supplies a large territory of the inferior wall. There is no significant change in direct comparison to prior cath studies. Prox RCA lesion is 30% stenosed. The lesion is calcified. Mid RCA lesion is 40% stenosed. The lesion is calcified. No change from prior study Non-stenotic Dist RCA lesion was previously treated. Stent remains widely patent with no restenosis  Intervention  No interventions have been documented.   CARDIAC CATHETERIZATION  CARDIAC CATHETERIZATION  10/07/2020  Conclusion   Prox RCA lesion is 30% stenosed.   Mid RCA lesion is 40% stenosed.   Prox LAD to Mid LAD lesion is 40% stenosed.   Previously placed Mid Cx to Dist Cx stent (unknown type) is  widely patent.   Non-stenotic Dist RCA lesion was previously treated.  Patent left main with no stenosis Patent LAD with mild-moderate proximal stenosis unchanged from previous cardiac cath studies Patent LCx stent with no significant stenosis Patent RCA with mild diffuse plaquing and patent stent in the distal RCA Normal LV filling pressure and no transaortic gradient across bioprosthetic aortic valve  Recommend: medical therapy.  Note the patient has frequent runs of PAT throughout the procedure with HR above 160 bpm and drop in blood pressure down to the 70's during these runs  Findings Coronary Findings Diagnostic  Dominance: Right  Left Main Vessel is angiographically normal. Patent left main with mild calcification and no stenosis.  Left Anterior Descending Prox LAD to Mid LAD lesion is 40% stenosed. Diffuse calcific disease in the proximal LAD is present. This is mild-moderate in severity and unchanged from prior cath studies. Stenosis estimated at 40-50%.  Left Circumflex Previously placed Mid Cx to Dist Cx stent (unknown type) is  widely patent. Stented segment in the LCx remains patent with no significant stenosis  Right Coronary Artery There is mild diffuse disease throughout the vessel. Large, dominant vessel. There are diffuse irregularities with mild stenoses in the proximal and mid vessel. The distal vessel is widely patent. The PDA and PLA branches are patent. The stented segment in the distal RCA is patent with no stenosis. Prox RCA lesion is 30% stenosed. The lesion is calcified. Mid RCA lesion is 40% stenosed. The lesion is calcified. No change from prior study Non-stenotic Dist RCA lesion was previously treated. Stent remains widely patent with no  restenosis  Intervention  No interventions have been documented.     ECHOCARDIOGRAM  ECHOCARDIOGRAM COMPLETE 05/12/2023  Narrative ECHOCARDIOGRAM REPORT    Patient Name:   LOYAL HOLZHEIMER Date of Exam: 05/12/2023 Medical Rec #:  [id number]        Height:       72.0 in Accession #:    [id number]       Weight:       207.6 lb Date of Birth:  Feb 08, 1935         BSA:          2.165 m Patient Age:    88 years         BP:           142/90 mmHg Patient Gender: M                HR:  60 bpm. Exam Location:  [address]  Procedure: 2D Echo (Both Spectral and Color Flow Doppler were utilized during procedure).  Indications:    I48.1 Persistent atrial fibrillation  History:        Patient has prior history of Echocardiogram examinations, most recent 04/06/2022. CHF, CAD, Pacemaker, Stroke, Arrythmias:Atrial Flutter and PVC, Signs/Symptoms:Fatigue; Risk Factors:Hypertension and Dyslipidemia. Aortic stenosis. S/P AVR (25mm Magna Ease). Orthostatic hypotension. Atrial tachycardia. Chronic angina. Prediabetes. Aortic Valve: 25 mm bioprosthetic valve is present in the aortic position. Procedure Date: 03/15/2011.  Sonographer:    Jon Hacker RCS Referring Phys: [phone] Vincentina Sollers  IMPRESSIONS   1. Left ventricular ejection fraction, by estimation, is 55 to 60%. The left ventricle has normal function. The left ventricle has no regional wall motion abnormalities. Left ventricular diastolic function could not be evaluated. 2. Right ventricular systolic function is mildly reduced. The right ventricular size is mildly enlarged. There is normal pulmonary artery systolic pressure. The estimated right ventricular systolic pressure is 33.4 mmHg. 3. Left atrial size was severely dilated. 4. Right atrial size was moderately dilated. 5. The mitral valve is degenerative. Mild mitral valve regurgitation. No evidence of mitral stenosis. Moderate mitral annular calcification. 6. The aortic  valve has been repaired/replaced. Aortic valve regurgitation is not visualized. There is a 25 mm bioprosthetic valve present in the aortic position. Procedure Date: 03/15/2011. Echo findings are consistent with normal structure and function of the aortic valve prosthesis. Aortic valve area, by VTI measures 3.09 cm. Aortic valve mean gradient measures 9.0 mmHg. Aortic valve Vmax measures 2.06 m/s. 7. There is moderate dilatation of the ascending aorta, measuring 44 mm. 8. The inferior vena cava is dilated in size with >50% respiratory variability, suggesting right atrial pressure of 8 mmHg.  Comparison(s): No significant change from prior study.  FINDINGS Left Ventricle: Left ventricular ejection fraction, by estimation, is 55 to 60%. The left ventricle has normal function. The left ventricle has no regional wall motion abnormalities. The left ventricular internal cavity size was normal in size. There is no left ventricular hypertrophy. Abnormal (paradoxical) septal motion consistent with post-operative status. Left ventricular diastolic function could not be evaluated due to atrial fibrillation. Left ventricular diastolic function could not be evaluated.  Right Ventricle: The right ventricular size is mildly enlarged. No increase in right ventricular wall thickness. Right ventricular systolic function is mildly reduced. There is normal pulmonary artery systolic pressure. The tricuspid regurgitant velocity is 2.52 m/s, and with an assumed right atrial pressure of 8 mmHg, the estimated right ventricular systolic pressure is 33.4 mmHg.  Left Atrium: Left atrial size was severely dilated.  Right Atrium: Right atrial size was moderately dilated.  Pericardium: There is no evidence of pericardial effusion.  Mitral Valve: The mitral valve is degenerative in appearance. Moderate mitral annular calcification. Mild mitral valve regurgitation. No evidence of mitral valve stenosis.  Tricuspid Valve: The  tricuspid valve is grossly normal. Tricuspid valve regurgitation is mild . No evidence of tricuspid stenosis.  Aortic Valve: The aortic valve has been repaired/replaced. Aortic valve regurgitation is not visualized. Aortic valve mean gradient measures 9.0 mmHg. Aortic valve peak gradient measures 17.0 mmHg. Aortic valve area, by VTI measures 3.09 cm. There is a 25 mm bioprosthetic valve present in the aortic position. Procedure Date: 03/15/2011. Echo findings are consistent with normal structure and function of the aortic valve prosthesis.  Pulmonic Valve: The pulmonic valve was grossly normal. Pulmonic valve regurgitation is trivial. No evidence of pulmonic stenosis.  Aorta: The  aortic root is normal in size and structure. There is moderate dilatation of the ascending aorta, measuring 44 mm.  Venous: The inferior vena cava is dilated in size with greater than 50% respiratory variability, suggesting right atrial pressure of 8 mmHg.  IAS/Shunts: The atrial septum is grossly normal.  Additional Comments: A device lead is visualized in the right ventricle.   LEFT VENTRICLE PLAX 2D LVIDd:         5.80 cm LVIDs:         3.70 cm LV PW:         1.10 cm LV IVS:        1.10 cm LVOT diam:     2.34 cm LV SV:         119 LV SV Index:   55 LVOT Area:     4.30 cm   RIGHT VENTRICLE RV S prime:     11.73 cm/s TAPSE (M-mode): 2.0 cm RVSP:           40.4 mmHg  LEFT ATRIUM              Index        RIGHT ATRIUM            Index LA diam:        6.20 cm  2.86 cm/m   RA Pressure: 15.00 mmHg LA Vol (A2C):   132.0 ml 60.98 ml/m  RA Area:     29.80 cm LA Vol (A4C):   110.0 ml 50.82 ml/m  RA Volume:   103.00 ml  47.58 ml/m LA Biplane Vol: 125.0 ml 57.75 ml/m AORTIC VALVE AV Area (Vmax):    2.71 cm AV Area (Vmean):   2.64 cm AV Area (VTI):     3.09 cm AV Vmax:           206.00 cm/s AV Vmean:          139.000 cm/s AV VTI:            0.385 m AV Peak Grad:      17.0 mmHg AV Mean Grad:       9.0 mmHg LVOT Vmax:         130.00 cm/s LVOT Vmean:        85.200 cm/s LVOT VTI:          0.277 m LVOT/AV VTI ratio: 0.72  AORTA Ao Asc diam: 4.40 cm  TRICUSPID VALVE TR Peak grad:   25.4 mmHg TR Vmax:        252.00 cm/s Estimated RAP:  15.00 mmHg RVSP:           40.4 mmHg  SHUNTS Systemic VTI:  0.28 m Systemic Diam: 2.34 cm  Darryle Decent MD Electronically signed by Darryle Decent MD Signature Date/Time: 05/12/2023/5:11:07 PM    Final    MONITORS  LONG TERM MONITOR (3-14 DAYS) 11/11/2020  Narrative 1. Atrial flutter with a CVR 2. Rare PVC's and NSVT 3. No prolonged pauses 4. Brief episodes of RVR with an ave HR of 63/min and briefly up to 130/min.   Gregg Taylor,MD  Patch Wear Time:  2 days and 23 hours (2022-09-09T16:34:38-0400 to 2022-09-12T15:40:33-0400)  1 run of Ventricular Tachycardia occurred lasting 11 beats with a max rate of 169 bpm (avg 153 bpm). Atrial Flutter occurred continuously (100% burden), ranging from 58-130 bpm (avg of 63 bpm). Ventricular Pacing was present. Isolated VEs were occasional (2.4%, 6112), VE Couplets were occasional (1.1%, 1473), and VE Triplets were rare (<1.0%, 2).  Ventricular Bigeminy and Trigeminy were present.       ______________________________________________________________________________________________      EKG:   EKG Interpretation Date/Time:  Monday January 09 2024 14:11:56 EST Ventricular Rate:  64 PR Interval:    QRS Duration:  136 QT Interval:  444 QTC Calculation: 458 R Axis:   -67  Text Interpretation: Atrial fibrillation with frequent ventricular-paced complexes Right bundle branch block Left anterior fascicular block Bifascicular block Left ventricular hypertrophy with repolarization abnormality ( R in aVL , Romhilt-Estes ) When compared with ECG of 10-Jun-2023 10:02, Vent. rate has increased BY   4 BPM Confirmed by Wonda Sharper [phone]) on 01/09/2024 2:25:36 PM    Recent Labs: 10/10/2023: ALT 19;  BUN 13; Creatinine 1.17; Hemoglobin 11.2; Platelet Count 202; Potassium 3.9; Sodium 141  Recent Lipid Panel    Component Value Date/Time   CHOL 131 04/13/2021 0109   CHOL 158 09/02/2017 1006   TRIG 27 04/13/2021 0109   HDL 62 04/13/2021 0109   HDL 72 09/02/2017 1006   CHOLHDL 2.1 04/13/2021 0109   VLDL 5 04/13/2021 0109   LDLCALC 64 04/13/2021 0109   LDLCALC 68 09/02/2017 1006   LDLDIRECT 117.9 11/16/2010 1355     Risk Assessment/Calculations:    CHA2DS2-VASc Score = 8   This indicates a 10.8% annual risk of stroke. The patient's score is based upon: CHF History: 1 HTN History: 1 Diabetes History: 1 Stroke History: 2 Vascular Disease History: 1 Age Score: 2 Gender Score: 0               Physical Exam:    VS:  BP 132/74   Pulse 60   Ht 6' 1 (1.854 m)   Wt 208 lb (94.3 kg)   SpO2 95%   BMI 27.44 kg/m     Wt Readings from Last 3 Encounters:  01/09/24 208 lb (94.3 kg)  12/27/23 208 lb (94.3 kg)  11/10/23 207 lb (93.9 kg)     GEN: Elderly male in no acute distress HEENT: Normal NECK: No JVD; No carotid bruits LYMPHATICS: No lymphadenopathy CARDIAC: Irregularly irregular 2/6 early peaking ejection murmur at the right upper sternal border RESPIRATORY:  Clear to auscultation without rales, wheezing or rhonchi  ABDOMEN: Soft, non-tender, non-distended MUSCULOSKELETAL: 1+ right ankle edema, trace left ankle edema; No deformity  SKIN: Warm and dry NEUROLOGIC:  Alert and oriented x 3 PSYCHIATRIC:  Normal affect   Assessment & Plan Persistent atrial fibrillation (HCC) Continue rate control and anticoagulation.  Patient's status post permanent pacemaker.  Anticoagulated with apixaban  and denies bleeding problems.  Heart rate well-controlled on today's EKG tracing. Coronary artery disease involving native coronary artery of native heart with angina pectoris Patient continues to have significant angina.  He has a long history of microvascular disease.  He has  undergone repeated cardiac catheterizations with reassuring results, most recently in 2024 when continued stent patency was demonstrated.  There is a strong postprandial component.  He has been evaluated with CTA and does not have any evidence of renal or mesenteric artery stenoses.  Will add low-dose isosorbide  15 mg daily.  Continue amlodipine  and metoprolol  at current doses.  He is well beta blocked.  He has been intolerant to ranolazine  in the past. Chronic heart failure with preserved ejection fraction (HFpEF) (HCC) No clear evidence of volume overload.  The patient request to be evaluated by Dr. Cherrie.  Will make a referral. S/P AVR (aortic valve replacement) Normal function of his aortic valve prosthesis on most recent  echo.  LVEF is normal at 55 to 60%, RV function is mildly reduced, estimated RVSP is 33 mmHg, and the mean transaortic gradient is 9 mmHg. Essential hypertension Blood pressure controlled on current medications.  Continue the same. Right foot pain Patient has a palpable DP pulse on the left.  His right foot is warm and appears well-perfused but I have difficulty feeling his pulse there.  Recommend ABIs. Heart failure with preserved ejection fraction (HFpEF), unspecified HF chronicity (HCC) Referral to advanced heart failure with Dr. Bensimhon.            Medication Adjustments/Labs and Tests Ordered: Current medicines are reviewed at length with the patient today.  Concerns regarding medicines are outlined above.  Orders Placed This Encounter  Procedures   AMB referral to [facility]   EKG 12-Lead   VAS US  ABI WITH/WO TBI   Meds ordered this encounter  Medications   isosorbide  mononitrate (IMDUR ) 30 MG 24 hr tablet    Sig: Take 0.5 tablets (15 mg total) by mouth daily.    Dispense:  60 tablet    Refill:  3    Patient Instructions  Medication Instructions:  START Isosorbide  Mononitrate (Imdur ) 15 mg once daily   *If you need a refill on your  cardiac medications before your next appointment, please call your pharmacy*  Lab Work: None ordered today. If you have labs (blood work) drawn today and your tests are completely normal, you will receive your results only by: MyChart Message (if you have MyChart) OR A paper copy in the mail If you have any lab test that is abnormal or we need to change your treatment, we will call you to review the results.  Testing/Procedures: Your physician has requested that you have an ankle brachial index (ABI). During this test an ultrasound and blood pressure cuff are used to evaluate the arteries that supply the arms and legs with blood. Allow thirty minutes for this exam. There are no restrictions or special instructions.  Please note: We ask at that you not bring children with you during ultrasound (echo/ vascular) testing. Due to room size and safety concerns, children are not allowed in the ultrasound rooms during exams. Our front office staff cannot provide observation of children in our lobby area while testing is being conducted. An adult accompanying a patient to their appointment will only be allowed in the ultrasound room at the discretion of the ultrasound technician under special circumstances. We apologize for any inconvenience.   Follow-Up: At [facility], you and your health needs are our priority.  As part of our continuing mission to provide you with exceptional heart care, our providers are all part of one team.  This team includes your primary Cardiologist (physician) and Advanced Practice Providers or APPs (Physician Assistants and Nurse Practitioners) who all work together to provide you with the care you need, when you need it.  Your next appointment:   6 month(s)  Provider:   Ozell Fell, MD    We recommend signing up for the patient portal called MyChart.  Sign up information is provided on this After Visit Summary.  MyChart is used to connect with patients for  Virtual Visits (Telemedicine).  Patients are able to view lab/test results, encounter notes, upcoming appointments, etc.  Non-urgent messages can be sent to your provider as well.   To learn more about what you can do with MyChart, go to [email or web address].   Other Instructions You have been referred to  our heart failure clinic with Dr. Cherrie. Their office will be reaching out to schedule this appointment for you.     Signed, Ozell Fell, MD  01/09/2024 4:41 PM    [facility]

## 2024-01-09 NOTE — Assessment & Plan Note (Signed)
 Continue rate control and anticoagulation.  Patient's status post permanent pacemaker.  Anticoagulated with apixaban  and denies bleeding problems.  Heart rate well-controlled on today's EKG tracing.

## 2024-01-12 ENCOUNTER — Other Ambulatory Visit (HOSPITAL_BASED_OUTPATIENT_CLINIC_OR_DEPARTMENT_OTHER): Payer: Self-pay

## 2024-01-16 ENCOUNTER — Ambulatory Visit (HOSPITAL_COMMUNITY)
Admission: RE | Admit: 2024-01-16 | Discharge: 2024-01-16 | Disposition: A | Discharge status: Home / Self Care | Source: Ambulatory Visit | Attending: Cardiovascular Disease | Admitting: Cardiovascular Disease

## 2024-01-16 ENCOUNTER — Ambulatory Visit: Payer: Self-pay | Admitting: Cardiovascular Disease

## 2024-01-16 DIAGNOSIS — M79671 Pain in right foot: Secondary | ICD-10-CM | POA: Insufficient documentation

## 2024-01-16 LAB — VAS US ABI WITH/WO TBI
Left ABI: 1.04
Right ABI: 1.08

## 2024-01-17 ENCOUNTER — Other Ambulatory Visit: Payer: Self-pay

## 2024-01-21 ENCOUNTER — Other Ambulatory Visit (HOSPITAL_BASED_OUTPATIENT_CLINIC_OR_DEPARTMENT_OTHER): Payer: Self-pay

## 2024-01-23 ENCOUNTER — Other Ambulatory Visit (HOSPITAL_BASED_OUTPATIENT_CLINIC_OR_DEPARTMENT_OTHER): Payer: Self-pay

## 2024-01-23 ENCOUNTER — Other Ambulatory Visit: Payer: Self-pay

## 2024-01-23 MED ORDER — BUTALBITAL-APAP-CAFFEINE 50-325-40 MG PO TABS
1.0000 | ORAL_TABLET | Freq: Three times a day (TID) | ORAL | 2 refills | Status: AC | PRN
  Filled 2024-01-23 – 2024-02-22 (×2): qty 90, 30d supply, fill #0
  Filled 2024-03-21: qty 90, 30d supply, fill #1

## 2024-01-23 MED ORDER — HYDROCODONE-ACETAMINOPHEN 5-325 MG PO TABS
1.0000 | ORAL_TABLET | Freq: Three times a day (TID) | ORAL | 0 refills | Status: AC
  Filled 2024-01-23 – 2024-01-24 (×2): qty 90, 30d supply, fill #0

## 2024-01-23 MED ORDER — HYDROCODONE-ACETAMINOPHEN 5-325 MG PO TABS
1.0000 | ORAL_TABLET | Freq: Three times a day (TID) | ORAL | 0 refills | Status: AC
  Filled 2024-02-22: qty 90, 30d supply, fill #0

## 2024-01-24 ENCOUNTER — Other Ambulatory Visit (HOSPITAL_BASED_OUTPATIENT_CLINIC_OR_DEPARTMENT_OTHER): Payer: Self-pay

## 2024-01-24 ENCOUNTER — Other Ambulatory Visit: Payer: Self-pay

## 2024-01-30 ENCOUNTER — Other Ambulatory Visit (HOSPITAL_BASED_OUTPATIENT_CLINIC_OR_DEPARTMENT_OTHER): Payer: Self-pay

## 2024-01-30 ENCOUNTER — Other Ambulatory Visit: Payer: Self-pay

## 2024-01-31 ENCOUNTER — Non-Acute Institutional Stay: Payer: Self-pay | Admitting: Internal Medicine

## 2024-01-31 ENCOUNTER — Other Ambulatory Visit (HOSPITAL_BASED_OUTPATIENT_CLINIC_OR_DEPARTMENT_OTHER): Payer: Self-pay

## 2024-01-31 ENCOUNTER — Other Ambulatory Visit: Payer: Self-pay | Admitting: Internal Medicine

## 2024-01-31 ENCOUNTER — Encounter: Payer: Self-pay | Admitting: Internal Medicine

## 2024-01-31 ENCOUNTER — Other Ambulatory Visit: Payer: Self-pay

## 2024-01-31 DIAGNOSIS — N401 Enlarged prostate with lower urinary tract symptoms: Secondary | ICD-10-CM

## 2024-01-31 DIAGNOSIS — E785 Hyperlipidemia, unspecified: Secondary | ICD-10-CM

## 2024-01-31 DIAGNOSIS — D472 Monoclonal gammopathy: Secondary | ICD-10-CM

## 2024-01-31 DIAGNOSIS — M8580 Other specified disorders of bone density and structure, unspecified site: Secondary | ICD-10-CM

## 2024-01-31 DIAGNOSIS — G894 Chronic pain syndrome: Secondary | ICD-10-CM | POA: Diagnosis not present

## 2024-01-31 DIAGNOSIS — Z7952 Long term (current) use of systemic steroids: Secondary | ICD-10-CM

## 2024-01-31 DIAGNOSIS — K5903 Drug induced constipation: Secondary | ICD-10-CM | POA: Diagnosis not present

## 2024-01-31 DIAGNOSIS — M158 Other polyosteoarthritis: Secondary | ICD-10-CM | POA: Diagnosis not present

## 2024-01-31 DIAGNOSIS — I4819 Other persistent atrial fibrillation: Secondary | ICD-10-CM

## 2024-01-31 DIAGNOSIS — H34231 Retinal artery branch occlusion, right eye: Secondary | ICD-10-CM

## 2024-01-31 DIAGNOSIS — I10 Essential (primary) hypertension: Secondary | ICD-10-CM

## 2024-01-31 DIAGNOSIS — E1159 Type 2 diabetes mellitus with other circulatory complications: Secondary | ICD-10-CM

## 2024-01-31 DIAGNOSIS — I25118 Atherosclerotic heart disease of native coronary artery with other forms of angina pectoris: Secondary | ICD-10-CM | POA: Diagnosis not present

## 2024-01-31 MED ORDER — DESOXIMETASONE 0.25 % EX CREA
1.0000 | TOPICAL_CREAM | Freq: Two times a day (BID) | CUTANEOUS | 1 refills | Status: DC
  Filled 2024-01-31 – 2024-02-14 (×2): qty 60, 30d supply, fill #0
  Filled 2024-03-10: qty 60, 30d supply, fill #1

## 2024-02-01 ENCOUNTER — Other Ambulatory Visit: Payer: Self-pay

## 2024-02-02 ENCOUNTER — Ambulatory Visit: Attending: Internal Medicine | Admitting: Internal Medicine

## 2024-02-02 ENCOUNTER — Other Ambulatory Visit (HOSPITAL_BASED_OUTPATIENT_CLINIC_OR_DEPARTMENT_OTHER): Payer: Self-pay

## 2024-02-02 ENCOUNTER — Encounter: Payer: Self-pay | Admitting: Internal Medicine

## 2024-02-02 VITALS — BP 122/62 | HR 62 | Ht 74.0 in | Wt 207.0 lb

## 2024-02-02 DIAGNOSIS — I4819 Other persistent atrial fibrillation: Secondary | ICD-10-CM | POA: Insufficient documentation

## 2024-02-02 NOTE — Patient Instructions (Signed)

## 2024-02-02 NOTE — Progress Notes (Signed)
 HPI Dr. Amaryllis returns today for followup. He is a pleasant 89 yo man with persistent atrial fib/flutter, sinus node dysfunction, s/p PPM insertion, AS s/p AVR, HTN, and CAD. He has also had atrial flutter with a RVR coming from the [place]. He most recently has had atrial fib with a fairly slow VR and ventricular pacing. We had adjusted his beta blocker down and the amount of pacing is reduced. He notes that he gets tired easily. He denies worsening chest pain particularly when he eats as well as abdominal discomfort.He admits to being more sedentary. He had been in the hospital with diverticulitis. He is weak with exertion and is using a cane.  Allergies  Allergen Reactions   Contrast Media [Iodinated Contrast Media] Hives   Gadolinium Derivatives Hives   Iodine-131 Hives   Metrizamide Hives   Tetanus Toxoid-Containing Vaccines     Redness      Current Outpatient Medications  Medication Sig Dispense Refill   amLODipine  (NORVASC ) 2.5 MG tablet Take 2.5 mg by mouth daily.     amLODipine  (NORVASC ) 5 MG tablet Take 1 tablet (5 mg total) by mouth daily. (Patient taking differently: Take 5 mg by mouth as needed.) 90 tablet 3   apixaban  (ELIQUIS ) 5 MG TABS tablet Take 1 tablet (5 mg total) by mouth 2 (two) times daily. 180 tablet 1   butalbital -acetaminophen -caffeine  (BAC, BUTALBITAL -ACETAMIN-CAFF,) 50-325-40 MG tablet Take 1 tablet by mouth 3 (three) times daily as needed. (Patient taking differently: Take 1 tablet by mouth 2 (two) times daily.) 90 tablet 2   butalbital -acetaminophen -caffeine  (FIORICET ) 50-325-40 MG tablet Take 1 tablet by mouth three times a day as needed pt aware of acetaminophen  in Norco as well 90 tablet 2   cholecalciferol  (VITAMIN D3) 25 MCG (1000 UNIT) tablet Take 1,000 Units by mouth daily.     cilostazol  (PLETAL ) 100 MG tablet Take 1 tablet (100 mg total) by mouth 2 (two) times daily. 180 tablet 1   Coenzyme Q10 200 MG TABS Take 200 mg by mouth daily.      desoximetasone  (TOPICORT ) 0.25 % cream Apply 1 Application topically 2 (two) times daily. 60 g 1   diclofenac Sodium (VOLTAREN) 1 % GEL Apply 2 g topically 2 (two) times daily as needed (knee pain).     digoxin  (LANOXIN ) 0.125 MG tablet Take 1 tablet (0.125 mg total) by mouth daily. 90 tablet 3   doxazosin  (CARDURA ) 4 MG tablet Take 1 tablet (4 mg total) by mouth daily. 90 tablet 3   ezetimibe  (ZETIA ) 10 MG tablet Take 1 tablet (10 mg total) by mouth daily. 90 tablet 3   furosemide  (LASIX ) 20 MG tablet Take 20 mg by mouth daily as needed for fluid.     HYDROcodone -acetaminophen  (NORCO/VICODIN) 5-325 MG tablet Take 1 tablet by mouth 3 (three) times daily as directed 90 tablet 0   [START ON 02/21/2024] HYDROcodone -acetaminophen  (NORCO/VICODIN) 5-325 MG tablet Take 1 tablet by mouth 3 (three) times daily as directed 90 tablet 0   hydrocortisone  (ANUSOL -HC) 25 MG suppository Place 1 suppository (25 mg total) rectally at bedtime as needed for hemorrhoids or anal itching. 12 suppository 1   isosorbide  mononitrate (IMDUR ) 30 MG 24 hr tablet Take 0.5 tablets (15 mg total) by mouth daily. 60 tablet 3   lansoprazole  (PREVACID ) 30 MG capsule Take 1 capsule (30 mg total) by mouth daily at 12 noon. 30 capsule 11   linaclotide  (LINZESS ) 145 MCG CAPS capsule Take 1 capsule (145 mcg total)  by mouth daily before breakfast. 90 capsule 3   LORazepam  (ATIVAN ) 1 MG tablet Take 1 tablet (1 mg) by mouth at bedtime, and 0.5 tablet (0.5 mg) daily as needed for anxiety. 60 tablet 5   magnesium  oxide (MAG-OX) 400 MG tablet Take 1 tablet (400 mg total) by mouth daily. 120 tablet 1   metoprolol  succinate (TOPROL  XL) 25 MG 24 hr tablet Take 3 tablets (75 mg total) by mouth 2 (two) times daily. 540 tablet 1   Multiple Vitamins-Minerals (MULTIVITAMIN WITH MINERALS) tablet Take 1 tablet by mouth daily.     nitroGLYCERIN  (NITROSTAT ) 0.4 MG SL tablet Place 1 tablet (0.4 mg total) under the tongue every 5 (five) minutes as needed for  chest pain. 25 tablet 5   Polyethyl Glycol-Propyl Glycol (SYSTANE OP) Place 1 drop into both eyes daily as needed (dry eyes).     polyethylene glycol (MIRALAX  / GLYCOLAX ) 17 g packet Take 17 g by mouth daily. Patient taking 1-2 everyday     potassium chloride  SA (KLOR-CON  M) 20 MEQ tablet Take 1 tablet (20 mEq total) by mouth daily. 90 tablet 3   predniSONE  (DELTASONE ) 5 MG tablet Take 1 tablet (5 mg total) by mouth daily with breakfast. 90 tablet 1   rosuvastatin  (CRESTOR ) 20 MG tablet Take 1 tablet (20 mg total) by mouth at bedtime. Please keep scheduled appointment for future refills. 90 tablet 3   sennosides-docusate sodium  (SENOKOT-S) 8.6-50 MG tablet Take 1 tablet by mouth 2 (two) times daily.     telmisartan  (MICARDIS ) 20 MG tablet Take 1 tablet (20 mg total) by mouth daily. 90 tablet 3   No current facility-administered medications for this visit.     Past Medical History:  Diagnosis Date   Aortic stenosis    moderate aortic stenosis   Arthritis    Benign prostatic hypertrophy    Chronic systolic heart failure (HCC)    Chronotropic incompetence with sinus node dysfunction    Status post Guidant dual-mode, dual-pacing, dual-sensing  pacemaker   implantation now programmed to AAI with recent generator change.   Coronary artery disease    status post multiple prior percutaneous coronary interventions, microvascular angina per Dr Obie   Diverticulitis sigmoid colon recurrent    Dysfunctional autonomic nervous system    Dyspnea    Heart murmur    History of primary hypertension    Hypercoagulable state    chronically anticoagulated with coumadin    Hyperlipidemia    Hyperthyroidism    Hypothyroidism    Dr. Mirna   MGUS (monoclonal gammopathy of unknown significance) 02/17/2013   Ocular myasthenia    Osteoarthritis    Paroxysmal atrial fibrillation (HCC)    DR. Morris,    Prediabetes 09/21/2017   Stroke (HCC)    1990    ROS:   All systems reviewed and negative  except as noted in the HPI.   Past Surgical History:  Procedure Laterality Date   AORTIC VALVE REPLACEMENT  03/15/2011   Procedure: AORTIC VALVE REPLACEMENT (AVR);  Surgeon: Dorise MARLA Fellers, MD;  Location: [facility] OR;  Service: Open Heart Surgery;  Laterality: N/A;   APPENDECTOMY     CARDIAC CATHETERIZATION     13   CARDIOVERSION     CARDIOVERSION  04/15/2011   Procedure: CARDIOVERSION;  Surgeon: Ezra Shuck, MD;  Location: [facility] ENDOSCOPY;  Service: Cardiovascular;  Laterality: N/A;   CARDIOVERSION N/A 09/11/2014   Procedure: CARDIOVERSION;  Surgeon: Wilbert JONELLE Bihari, MD;  Location: [facility] ENDOSCOPY;  Service: Cardiovascular;  Laterality: N/A;   CARDIOVERSION N/A 06/27/2015   Procedure: CARDIOVERSION;  Surgeon: Aleene JINNY Passe, MD;  Location: [facility] ENDOSCOPY;  Service: Cardiovascular;  Laterality: N/A;   CARDIOVERSION N/A 07/04/2015   Procedure: CARDIOVERSION;  Surgeon: Danelle LELON Birmingham, MD;  Location: [facility] ENDOSCOPY;  Service: Cardiovascular;  Laterality: N/A;   CARDIOVERSION N/A 04/13/2017   Procedure: CARDIOVERSION;  Surgeon: Shlomo Wilbert SAUNDERS, MD;  Location: [facility] ENDOSCOPY;  Service: Cardiovascular;  Laterality: N/A;   COLONOSCOPY     CORONARY STENT INTERVENTION N/A 10/15/2019   Procedure: CORONARY STENT INTERVENTION;  Surgeon: Dann Candyce RAMAN, MD;  Location: [facility] INVASIVE CV LAB;  Service: Cardiovascular;  Laterality: N/A;   EP IMPLANTABLE DEVICE N/A 06/16/2015   Procedure: Pacemaker Implant;  Surgeon: Danelle LELON Birmingham, MD;  Location: [facility] INVASIVE CV LAB;  Service: Cardiovascular;  Laterality: N/A;   ESOPHAGOGASTRODUODENOSCOPY N/A 11/10/2023   Procedure: EGD (ESOPHAGOGASTRODUODENOSCOPY);  Surgeon: Nandigam, Kavitha V, MD;  Location: [facility] ENDOSCOPY;  Service: Gastroenterology;  Laterality: N/A;   hemrrhoidectomy     LEFT AND RIGHT HEART CATHETERIZATION WITH CORONARY ANGIOGRAM Bilateral 02/01/2011   Procedure: LEFT AND RIGHT HEART CATHETERIZATION WITH CORONARY ANGIOGRAM;  Surgeon: Debby JONETTA Como, MD;   Location: [facility] CATH LAB;  Service: Cardiovascular;  Laterality: Bilateral;   LEFT HEART CATH AND CORONARY ANGIOGRAPHY N/A 10/15/2019   Procedure: LEFT HEART CATH AND CORONARY ANGIOGRAPHY;  Surgeon: Dann Candyce RAMAN, MD;  Location: [facility] INVASIVE CV LAB;  Service: Cardiovascular;  Laterality: N/A;   LEFT HEART CATH AND CORONARY ANGIOGRAPHY N/A 10/07/2020   Procedure: LEFT HEART CATH AND CORONARY ANGIOGRAPHY;  Surgeon: Wonda Sharper, MD;  Location: [facility] INVASIVE CV LAB;  Service: Cardiovascular;  Laterality: N/A;   LEFT HEART CATH AND CORONARY ANGIOGRAPHY N/A 07/06/2022   Procedure: LEFT HEART CATH AND CORONARY ANGIOGRAPHY;  Surgeon: Wonda Sharper, MD;  Location: [facility] INVASIVE CV LAB;  Service: Cardiovascular;  Laterality: N/A;   LUMBAR LAMINECTOMY/DECOMPRESSION MICRODISCECTOMY Right 02/23/2021   Procedure: Right Lumbar four-five microdiscectomy;  Surgeon: Joshua Alm RAMAN, MD;  Location: [facility] OR;  Service: Neurosurgery;  Laterality: Right;   MAZE  03/15/2011   Procedure: MAZE;  Surgeon: Dorise MARLA Fellers, MD;  Location: [facility] OR;  Service: Open Heart Surgery;  Laterality: N/A;   MOHS SURGERY  09/2021   PACEMAKER INSERTION  1991   Guidant PPM, most recent Generator Change by Dr Obie was 08/22/06   RIGHT/LEFT HEART CATH AND CORONARY ANGIOGRAPHY N/A 07/06/2016   Procedure: Right/Left Heart Cath and Coronary Angiography;  Surgeon: Wonda Sharper, MD;  Location: [facility] INVASIVE CV LAB;  Service: Cardiovascular;  Laterality: N/A;   TEE WITHOUT CARDIOVERSION  04/15/2011   Procedure: TRANSESOPHAGEAL ECHOCARDIOGRAM (TEE);  Surgeon: Ezra Shuck, MD;  Location: [facility] ENDOSCOPY;  Service: Cardiovascular;  Laterality: N/A;   TEE WITHOUT CARDIOVERSION N/A 09/11/2014   Procedure: TRANSESOPHAGEAL ECHOCARDIOGRAM (TEE);  Surgeon: Wilbert SAUNDERS Shlomo, MD;  Location: [facility] ENDOSCOPY;  Service: Cardiovascular;  Laterality: N/A;     Family History  Problem Relation Age of Onset   Heart disease Brother        Twin brother has coronary  disease and recent AVR for AS   CAD Brother    Atrial fibrillation Brother    Lymphoma Brother    Sarcoidosis Brother    Depression Daughter    Anorexia nervosa Daughter    Hypertension Son    Anesthesia problems Neg Hx    Hypotension Neg Hx    Malignant hyperthermia Neg Hx    Pseudochol deficiency Neg Hx  Social History   Socioeconomic History   Marital status: Married    Spouse name: Alisa    Number of children: 3   Years of education: Not on file   Highest education level: Not on file  Occupational History   Occupation: Retired    Comment: Physician  Tobacco Use   Smoking status: Never   Smokeless tobacco: Never  Vaping Use   Vaping status: Never Used  Substance and Sexual Activity   Alcohol use: No    Alcohol/week: 0.0 standard drinks of alcohol   Drug use: No   Sexual activity: Not Currently  Other Topics Concern   Not on file  Social History Narrative   Married to [place]. Has grown children   Retired proofreader MD      Never smoker no alcohol      Social Drivers of Corporate Investment Banker Strain: Not on file  Food Insecurity: No Food Insecurity (01/08/2023)   Hunger Vital Sign    Worried About Running Out of Food in the Last Year: Never true    Ran Out of Food in the Last Year: Never true  Transportation Needs: No Transportation Needs (01/08/2023)   PRAPARE - Administrator, Civil Service (Medical): No    Lack of Transportation (Non-Medical): No  Physical Activity: Not on file  Stress: Not on file  Social Connections: Not on file  Intimate Partner Violence: Not At Risk (01/08/2023)   Humiliation, Afraid, Rape, and Kick questionnaire    Fear of Current or Ex-Partner: No    Emotionally Abused: No    Physically Abused: No    Sexually Abused: No     BP 122/62   Pulse 62   Ht 6' 2 (1.88 m)   Wt 207 lb (93.9 kg)   SpO2 95%   BMI 26.58 kg/m   Physical Exam:  Well appearing NAD HEENT: Unremarkable Neck:  No JVD, no  thyromegally Lymphatics:  No adenopathy Back:  No CVA tenderness Lungs:  Clear HEART:  Regular rate rhythm, no murmurs, no rubs, no clicks Abd:  soft, positive bowel sounds, no organomegally, no rebound, no guarding Ext:  2 plus pulses, no edema, no cyanosis, no clubbing Skin:  No rashes no nodules Neuro:  CN II through XII intact, motor grossly intact  EKG - atrial fib with ventricular pacing  DEVICE  Normal device function.  See PaceArt for details.   Assess/Plan:  Atrial fib/flutter - his rates are well controlled. I have reviewed his histograms.  PPM -his [place] [employer] DDD PM is working normally and we have left him programmed DDIR. He has about 2 years of battery longevity. Chronic diastolic heart failure - he has class 2-3 symptoms. Continue current meds. CAD - he appears to have had more abdominal angina and has seen Dr. Wonda and his team.   Danelle Waddell COME

## 2024-02-05 NOTE — Progress Notes (Signed)
 Location:   [facility]   Place of Service:   Clinic  Provider:   Code Status:  Goals of Care:     11/10/2023    8:30 AM  Advanced Directives  Does Patient Have a Medical Advance Directive? Yes  Type of Advance Directive Living will;Healthcare Power of Attorney  Copy of Healthcare Power of Attorney in Chart? No - copy requested     Chief Complaint  Patient presents with   Follow-up    6 month follow up 9    HPI: Patient is a 89 y.o. male seen today for medical management of chronic diseases.    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 Orlando now who is managing his pain with Norco and Fioricet    Has h/o CAD, s/p Stent with Chronic Angina Follows with Dr. 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 Rosemarie he needs to stay on it 2D echo which showed a EF of 60% with inferior wall hypokinesis     H/o Skin cancers s/p Mohrs surgery for Melanoma Now Using Cane to walk Continues to have severe pain due to his arthritis  Past Medical History:   Diagnosis Date   Aortic stenosis    moderate aortic stenosis   Arthritis    Benign prostatic hypertrophy    Chronic systolic heart failure (HCC)    Chronotropic incompetence with sinus node dysfunction    Status post Guidant dual-mode, dual-pacing, dual-sensing  pacemaker   implantation now programmed to AAI with recent generator change.   Coronary artery disease    status post multiple prior percutaneous coronary interventions, microvascular angina per Dr Obie   Diverticulitis sigmoid colon recurrent    Dysfunctional autonomic nervous system    Dyspnea    Heart murmur    History of primary hypertension    Hypercoagulable state    chronically anticoagulated with coumadin    Hyperlipidemia    Hyperthyroidism    Hypothyroidism    Dr. Mirna   MGUS (monoclonal gammopathy of unknown significance) 02/17/2013   Ocular myasthenia    Osteoarthritis    Paroxysmal atrial fibrillation (HCC)    DR. Morris,    Prediabetes 09/21/2017   Stroke (HCC)    1990    Past Surgical History:  Procedure Laterality Date   AORTIC VALVE REPLACEMENT  03/15/2011   Procedure: AORTIC VALVE REPLACEMENT (AVR);  Surgeon: Dorise MARLA Fellers, MD;  Location: [facility] OR;  Service: Open Heart Surgery;  Laterality: N/A;   APPENDECTOMY     CARDIAC CATHETERIZATION  11   CARDIOVERSION     CARDIOVERSION  04/15/2011   Procedure: CARDIOVERSION;  Surgeon: Ezra Shuck, MD;  Location: [facility] ENDOSCOPY;  Service: Cardiovascular;  Laterality: N/A;   CARDIOVERSION N/A 09/11/2014   Procedure: CARDIOVERSION;  Surgeon: Wilbert JONELLE Bihari, MD;  Location: [facility] ENDOSCOPY;  Service: Cardiovascular;  Laterality: N/A;   CARDIOVERSION N/A 06/27/2015   Procedure: CARDIOVERSION;  Surgeon: Aleene JINNY Passe, MD;  Location: [facility] ENDOSCOPY;  Service: Cardiovascular;  Laterality: N/A;   CARDIOVERSION N/A 07/04/2015   Procedure: CARDIOVERSION;  Surgeon: Danelle LELON Birmingham, MD;  Location: [facility] ENDOSCOPY;  Service: Cardiovascular;  Laterality: N/A;    CARDIOVERSION N/A 04/13/2017   Procedure: CARDIOVERSION;  Surgeon: Bihari Wilbert JONELLE, MD;  Location: [facility] ENDOSCOPY;  Service: Cardiovascular;  Laterality: N/A;   COLONOSCOPY     CORONARY STENT INTERVENTION N/A 10/15/2019   Procedure: CORONARY STENT INTERVENTION;  Surgeon: Dann Candyce RAMAN, MD;  Location: [facility] INVASIVE CV LAB;  Service: Cardiovascular;  Laterality: N/A;   EP IMPLANTABLE DEVICE N/A 06/16/2015   Procedure: Pacemaker Implant;  Surgeon: Danelle LELON Birmingham, MD;  Location: [facility] INVASIVE CV LAB;  Service: Cardiovascular;  Laterality: N/A;   ESOPHAGOGASTRODUODENOSCOPY N/A 11/10/2023   Procedure: EGD (ESOPHAGOGASTRODUODENOSCOPY);  Surgeon: Nandigam, Kavitha V, MD;  Location: [facility] ENDOSCOPY;  Service: Gastroenterology;  Laterality: N/A;   hemrrhoidectomy     LEFT AND RIGHT HEART CATHETERIZATION WITH CORONARY ANGIOGRAM Bilateral 02/01/2011   Procedure: LEFT AND RIGHT HEART CATHETERIZATION WITH CORONARY ANGIOGRAM;  Surgeon: Debby JONETTA Como, MD;  Location: [facility] CATH LAB;  Service: Cardiovascular;  Laterality: Bilateral;   LEFT HEART CATH AND CORONARY ANGIOGRAPHY N/A 10/15/2019   Procedure: LEFT HEART CATH AND CORONARY ANGIOGRAPHY;  Surgeon: Dann Candyce RAMAN, MD;  Location: [facility] INVASIVE CV LAB;  Service: Cardiovascular;  Laterality: N/A;   LEFT HEART CATH AND CORONARY ANGIOGRAPHY N/A 10/07/2020   Procedure: LEFT HEART CATH AND CORONARY ANGIOGRAPHY;  Surgeon: Wonda Sharper, MD;  Location: [facility] INVASIVE CV LAB;  Service: Cardiovascular;  Laterality: N/A;   LEFT HEART CATH AND CORONARY ANGIOGRAPHY N/A 07/06/2022   Procedure: LEFT HEART CATH AND CORONARY ANGIOGRAPHY;  Surgeon: Wonda Sharper, MD;  Location: [facility] INVASIVE CV LAB;  Service: Cardiovascular;  Laterality: N/A;   LUMBAR LAMINECTOMY/DECOMPRESSION MICRODISCECTOMY Right 02/23/2021   Procedure: Right Lumbar four-five microdiscectomy;  Surgeon: Joshua Alm RAMAN, MD;  Location: [facility] OR;  Service: Neurosurgery;  Laterality: Right;   MAZE  03/15/2011    Procedure: MAZE;  Surgeon: Dorise MARLA Fellers, MD;  Location: [facility] OR;  Service: Open Heart Surgery;  Laterality: N/A;   MOHS SURGERY  09/2021   PACEMAKER INSERTION  1991   Guidant PPM, most recent Generator Change by Dr Obie was 08/22/06   RIGHT/LEFT HEART CATH AND CORONARY ANGIOGRAPHY N/A 07/06/2016   Procedure: Right/Left Heart Cath and Coronary Angiography;  Surgeon: Wonda Sharper, MD;  Location: [facility] INVASIVE CV LAB;  Service: Cardiovascular;  Laterality: N/A;   TEE WITHOUT CARDIOVERSION  04/15/2011   Procedure: TRANSESOPHAGEAL ECHOCARDIOGRAM (TEE);  Surgeon: Ezra Shuck, MD;  Location: [facility] ENDOSCOPY;  Service: Cardiovascular;  Laterality: N/A;   TEE WITHOUT CARDIOVERSION N/A 09/11/2014   Procedure: TRANSESOPHAGEAL ECHOCARDIOGRAM (TEE);  Surgeon: Wilbert JONELLE Bihari, MD;  Location: [facility] ENDOSCOPY;  Service: Cardiovascular;  Laterality: N/A;    Allergies  Allergen Reactions   Contrast Media [Iodinated Contrast Media] Hives   Gadolinium Derivatives Hives   Iodine-131 Hives   Metrizamide Hives   Tetanus Toxoid-Containing Vaccines     Redness     Outpatient Encounter Medications as of  01/31/2024  Medication Sig   amLODipine  (NORVASC ) 2.5 MG tablet Take 2.5 mg by mouth daily.   amLODipine  (NORVASC ) 5 MG tablet Take 1 tablet (5 mg total) by mouth daily. (Patient taking differently: Take 5 mg by mouth as needed.)   apixaban  (ELIQUIS ) 5 MG TABS tablet Take 1 tablet (5 mg total) by mouth 2 (two) times daily.   butalbital -acetaminophen -caffeine  (BAC, BUTALBITAL -ACETAMIN-CAFF,) 50-325-40 MG tablet Take 1 tablet by mouth 3 (three) times daily as needed. (Patient taking differently: Take 1 tablet by mouth 2 (two) times daily.)   cholecalciferol  (VITAMIN D3) 25 MCG (1000 UNIT) tablet Take 1,000 Units by mouth daily.   cilostazol  (PLETAL ) 100 MG tablet Take 1 tablet (100 mg total) by mouth 2 (two) times daily.   Coenzyme Q10 200 MG TABS Take 200 mg by mouth daily.   diclofenac Sodium (VOLTAREN) 1 % GEL  Apply 2 g topically 2 (two) times daily as needed (knee pain).   digoxin  (LANOXIN ) 0.125 MG tablet Take 1 tablet (0.125 mg total) by mouth daily.   doxazosin  (CARDURA ) 4 MG tablet Take 1 tablet (4 mg total) by mouth daily.   ezetimibe  (ZETIA ) 10 MG tablet Take 1 tablet (10 mg total) by mouth daily.   furosemide  (LASIX ) 20 MG tablet Take 20 mg by mouth daily as needed for fluid.   HYDROcodone -acetaminophen  (NORCO/VICODIN) 5-325 MG tablet Take 1 tablet by mouth 3 (three) times daily as directed   [START ON 02/21/2024] HYDROcodone -acetaminophen  (NORCO/VICODIN) 5-325 MG tablet Take 1 tablet by mouth 3 (three) times daily as directed   hydrocortisone  (ANUSOL -HC) 25 MG suppository Place 1 suppository (25 mg total) rectally at bedtime as needed for hemorrhoids or anal itching.   isosorbide  mononitrate (IMDUR ) 30 MG 24 hr tablet Take 0.5 tablets (15 mg total) by mouth daily.   lansoprazole  (PREVACID ) 30 MG capsule Take 1 capsule (30 mg total) by mouth daily at 12 noon.   linaclotide  (LINZESS ) 145 MCG CAPS capsule Take 1 capsule (145 mcg total) by mouth daily before breakfast.   LORazepam  (ATIVAN ) 1 MG tablet Take 1 tablet (1 mg) by mouth at bedtime, and 0.5 tablet (0.5 mg) daily as needed for anxiety.   magnesium  oxide (MAG-OX) 400 MG tablet Take 1 tablet (400 mg total) by mouth daily.   metoprolol  succinate (TOPROL  XL) 25 MG 24 hr tablet Take 3 tablets (75 mg total) by mouth 2 (two) times daily.   Multiple Vitamins-Minerals (MULTIVITAMIN WITH MINERALS) tablet Take 1 tablet by mouth daily.   nitroGLYCERIN  (NITROSTAT ) 0.4 MG SL tablet Place 1 tablet (0.4 mg total) under the tongue every 5 (five) minutes as needed for chest pain.   Polyethyl Glycol-Propyl Glycol (SYSTANE OP) Place 1 drop into both eyes daily as needed (dry eyes).   polyethylene glycol (MIRALAX  / GLYCOLAX ) 17 g packet Take 17 g by mouth daily. Patient taking 1-2 everyday   potassium chloride  SA (KLOR-CON  M) 20 MEQ tablet Take 1 tablet (20 mEq  total) by mouth daily.   predniSONE  (DELTASONE ) 5 MG tablet Take 1 tablet (5 mg total) by mouth daily with breakfast.   rosuvastatin  (CRESTOR ) 20 MG tablet Take 1 tablet (20 mg total) by mouth at bedtime. Please keep scheduled appointment for future refills.   sennosides-docusate sodium  (SENOKOT-S) 8.6-50 MG tablet Take 1 tablet by mouth 2 (two) times daily.   telmisartan  (MICARDIS ) 20 MG tablet Take 1 tablet (20 mg total) by mouth daily.   [DISCONTINUED] desoximetasone  (TOPICORT ) 0.25 % cream Apply 1 Application topically 2 (two)  times daily.   butalbital -acetaminophen -caffeine  (FIORICET ) 50-325-40 MG tablet Take 1 tablet by mouth three times a day as needed pt aware of acetaminophen  in Norco as well   [DISCONTINUED] pregabalin (LYRICA) 75 MG capsule Take 75 mg by mouth 2 (two) times daily.   No facility-administered encounter medications on file as of 01/31/2024.    Review of Systems:  Review of Systems  Constitutional:  Negative for activity change, appetite change and unexpected weight change.  HENT: Negative.    Respiratory:  Negative for cough and shortness of breath.   Cardiovascular:  Positive for chest pain. Negative for leg swelling.  Gastrointestinal:  Negative for constipation.  Genitourinary:  Negative for frequency.  Musculoskeletal:  Positive for arthralgias, gait problem and myalgias.  Skin: Negative.  Negative for rash.  Neurological:  Negative for dizziness and weakness.  Psychiatric/Behavioral:  Negative for confusion and sleep disturbance.   All other systems reviewed and are negative.   Health Maintenance  Topic Date Due   Zoster Vaccines- Shingrix (1 of 2) Never done   COVID-19 Vaccine (4 - 2025-26 season) 10/31/2023   DTaP/Tdap/Td (2 - Td or Tdap) 08/01/2024 (Originally 10/24/2014)   Medicare Annual Wellness (AWV)  09/04/2024   Pneumococcal Vaccine: 50+ Years  Completed   Influenza Vaccine  Completed   Meningococcal B Vaccine  Aged Out    Physical  Exam: Vitals:   01/31/24 1009 01/31/24 1012  BP: (!) 150/110 (!) 154/100  Pulse: 70   Temp: 97.9 F (36.6 C)   SpO2: 99%   Weight: 207 lb (93.9 kg)   Height: 6' 1 (1.854 m)    Body mass index is 27.31 kg/m. Physical Exam Vitals reviewed.  Constitutional:      Appearance: Normal appearance.  HENT:     Head: Normocephalic.     Nose: Nose normal.     Mouth/Throat:     Mouth: Mucous membranes are moist.     Pharynx: Oropharynx is clear.  Eyes:     Pupils: Pupils are equal, round, and reactive to light.  Cardiovascular:     Rate and Rhythm: Normal rate. Rhythm irregular.     Pulses: Normal pulses.     Heart sounds: No murmur heard. Pulmonary:     Effort: Pulmonary effort is normal. No respiratory distress.     Breath sounds: Normal breath sounds. No rales.  Abdominal:     General: Abdomen is flat. Bowel sounds are normal.     Palpations: Abdomen is soft.  Musculoskeletal:        General: No swelling.     Cervical back: Neck supple.  Skin:    General: Skin is warm.  Neurological:     General: No focal deficit present.     Mental Status: He is alert and oriented to person, place, and time.  Psychiatric:        Mood and Affect: Mood normal.        Thought Content: Thought content normal.     Labs reviewed: Basic Metabolic Panel: Recent Labs    02/09/23 0852 05/24/23 1212 10/10/23 1109  NA 140 139 141  K 4.0 4.2 3.9  CL 101 99 100  CO2 32 31 29  GLUCOSE 136* 155* 153*  BUN 12 14 13   CREATININE 0.88 1.03 1.17  CALCIUM  9.6 9.8 10.0   Liver Function Tests: Recent Labs    02/09/23 0852 05/24/23 1212 10/10/23 1109  AST 20 16 25   ALT 16 12 19   ALKPHOS 116 108 124  BILITOT 0.6 0.5 0.5  PROT 7.7 7.8 8.0  ALBUMIN  4.2 4.3 4.3   Recent Labs    05/24/23 1212  LIPASE 32.0  AMYLASE 38   No results for input(s): AMMONIA in the last 8760 hours. CBC: Recent Labs    02/09/23 0852 05/05/23 0000 05/24/23 1212 10/10/23 1109  WBC 8.0 6.5 7.7 7.8   NEUTROABS 5.8  --  6.2 6.0  HGB 11.1* 11.2* 11.7* 11.2*  HCT 34.4* 33* 35.5* 34.7*  MCV 88.7  --  86.6 91.6  PLT 212 207 201.0 202   Lipid Panel: No results for input(s): CHOL, HDL, LDLCALC, TRIG, CHOLHDL, LDLDIRECT in the last 8760 hours. Lab Results  Component Value Date   HGBA1C 6.9 05/05/2023    Procedures since last visit: VAS US  ABI WITH/WO TBI Result Date: 01/16/2024  LOWER EXTREMITY DOPPLER STUDY Patient Name:  [name]  Date of Exam:   01/16/2024 Medical Rec #: [id number]         Accession #:    [id number] Date of Birth: 1934-05-25          Patient Gender: M Patient Age:   65 years Exam Location:  [address] Procedure:      VAS US  ABI WITH/WO TBI Referring Phys: SMITHFIELD FOODS --------------------------------------------------------------------------------  Indications: Poor pedal pulses and complaints of right great toe pain. High Risk Factors: Hypertension, coronary artery disease. Other Factors: Atrial flutter and atrial tachycardia.  Performing Technologist: Geni Lodge RVS, RCS  Examination Guidelines: A complete evaluation includes at minimum, Doppler waveform signals and systolic blood pressure reading at the level of bilateral brachial, anterior tibial, and posterior tibial arteries, when vessel segments are accessible. Bilateral testing is considered an integral part of a complete examination. Photoelectric Plethysmograph (PPG) waveforms and toe systolic pressure readings are included as required and additional duplex testing as needed. Limited examinations for reoccurring indications may be performed as noted.  ABI Findings: +---------+------------------+-----+---------+--------+ Right    Rt Pressure (mmHg)IndexWaveform Comment  +---------+------------------+-----+---------+--------+ Brachial 226                                      +---------+------------------+-----+---------+--------+ PTA      252               1.08 triphasic          +---------+------------------+-----+---------+--------+ DP       231               0.99 triphasic         +---------+------------------+-----+---------+--------+ Great Toe183               0.78                   +---------+------------------+-----+---------+--------+ +---------+------------------+-----+---------+-------+ Left     Lt Pressure (mmHg)IndexWaveform Comment +---------+------------------+-----+---------+-------+ Brachial 234                                     +---------+------------------+-----+---------+-------+ PTA      229               0.98 triphasic        +---------+------------------+-----+---------+-------+ DP       244               1.04 triphasic        +---------+------------------+-----+---------+-------+ [place]  0.76                  +---------+------------------+-----+---------+-------+ +-------+-----------+-----------+------------+------------+ ABI/TBIToday's ABIToday's TBIPrevious ABIPrevious TBI +-------+-----------+-----------+------------+------------+ Right  1.08       0.78                                +-------+-----------+-----------+------------+------------+ Left   1.04       0.76                                +-------+-----------+-----------+------------+------------+  No previous ABIs available for comparison.  Summary: Right: Resting right ankle-brachial index is within normal range. The right toe-brachial index is normal.  Left: Resting left ankle-brachial index is within normal range. The left toe-brachial index is normal.  *See table(s) above for measurements and observations.  Electronically signed by Timothy Gollan MD on 01/16/2024 at 9:49:06 PM.    Final     Assessment/Plan 1. Essential hypertension (Primary) Fluctuates Norvasc  Prn used per his nurse Also on Micardis , Metoprolol    2. Chronic pain syndrome Norco per Dr Orlando  3. Type 2 diabetes mellitus with other circulatory  complication, without long-term current use of insulin  (HCC) Needs follow up of his A1C  4. On prednisone  therapy Per Dr Helena no GDR  5. Other osteoarthritis involving multiple joints Norco   6. Osteopenia, unspecified location   7. MGUS (monoclonal gammopathy of unknown significance) Followed By Dr Cloretta  8. Coronary artery disease of native artery of native heart with stable angina pectoris Followed by Dr Wonda On Imdur   9. Hyperlipidemia, unspecified hyperlipidemia type Zetia   10. Benign prostatic hyperplasia with lower urinary tract symptoms, symptom details unspecified Cardura   11. Persistent atrial fibrillation (HCC) Digoxinn and Eliquis   12. Drug-induced constipation Linzess  and Miralax   13. Retinal artery branch occlusion of right eye Pletal     Labs/tests ordered:   Next appt:  Visit date not found

## 2024-02-13 ENCOUNTER — Other Ambulatory Visit (HOSPITAL_BASED_OUTPATIENT_CLINIC_OR_DEPARTMENT_OTHER): Payer: Self-pay

## 2024-02-14 ENCOUNTER — Other Ambulatory Visit (HOSPITAL_BASED_OUTPATIENT_CLINIC_OR_DEPARTMENT_OTHER): Payer: Self-pay

## 2024-02-14 ENCOUNTER — Other Ambulatory Visit: Payer: Self-pay

## 2024-02-16 ENCOUNTER — Other Ambulatory Visit (HOSPITAL_BASED_OUTPATIENT_CLINIC_OR_DEPARTMENT_OTHER): Payer: Self-pay

## 2024-02-22 ENCOUNTER — Other Ambulatory Visit (HOSPITAL_BASED_OUTPATIENT_CLINIC_OR_DEPARTMENT_OTHER): Payer: Self-pay

## 2024-02-27 ENCOUNTER — Other Ambulatory Visit (HOSPITAL_BASED_OUTPATIENT_CLINIC_OR_DEPARTMENT_OTHER): Payer: Self-pay

## 2024-03-07 ENCOUNTER — Telehealth (HOSPITAL_COMMUNITY): Payer: Self-pay | Admitting: Internal Medicine

## 2024-03-07 NOTE — Telephone Encounter (Signed)
 Called to confirm/remind patient of [facility] appointment [facility] on 03/07/2024.   Appointment:   [] Confirmed  [x] Left mess   [] No answer/No voice mail  [] VM Full/unable to leave message  [] Phone not in service  Patient reminded to bring all medications and/or complete list.  Confirmed patient has transportation. Gave directions, instructed to utilize valet parking.

## 2024-03-08 ENCOUNTER — Other Ambulatory Visit (HOSPITAL_BASED_OUTPATIENT_CLINIC_OR_DEPARTMENT_OTHER): Payer: Self-pay

## 2024-03-08 ENCOUNTER — Encounter (HOSPITAL_COMMUNITY): Payer: Self-pay | Admitting: Internal Medicine

## 2024-03-08 ENCOUNTER — Ambulatory Visit (HOSPITAL_COMMUNITY)
Admission: RE | Admit: 2024-03-08 | Discharge: 2024-03-08 | Disposition: A | Discharge status: Home / Self Care | Source: Ambulatory Visit | Attending: Internal Medicine | Admitting: Internal Medicine

## 2024-03-08 VITALS — BP 190/90 | HR 65 | Wt 210.6 lb

## 2024-03-08 DIAGNOSIS — I482 Chronic atrial fibrillation, unspecified: Secondary | ICD-10-CM | POA: Diagnosis not present

## 2024-03-08 DIAGNOSIS — I951 Orthostatic hypotension: Secondary | ICD-10-CM | POA: Diagnosis not present

## 2024-03-08 DIAGNOSIS — I25119 Atherosclerotic heart disease of native coronary artery with unspecified angina pectoris: Secondary | ICD-10-CM

## 2024-03-08 DIAGNOSIS — I4819 Other persistent atrial fibrillation: Secondary | ICD-10-CM | POA: Diagnosis not present

## 2024-03-08 DIAGNOSIS — Z7902 Long term (current) use of antithrombotics/antiplatelets: Secondary | ICD-10-CM | POA: Diagnosis not present

## 2024-03-08 DIAGNOSIS — I25118 Atherosclerotic heart disease of native coronary artery with other forms of angina pectoris: Secondary | ICD-10-CM | POA: Insufficient documentation

## 2024-03-08 DIAGNOSIS — F419 Anxiety disorder, unspecified: Secondary | ICD-10-CM | POA: Insufficient documentation

## 2024-03-08 DIAGNOSIS — I484 Atypical atrial flutter: Secondary | ICD-10-CM | POA: Diagnosis present

## 2024-03-08 DIAGNOSIS — I471 Supraventricular tachycardia, unspecified: Secondary | ICD-10-CM | POA: Diagnosis present

## 2024-03-08 DIAGNOSIS — R5381 Other malaise: Secondary | ICD-10-CM | POA: Insufficient documentation

## 2024-03-08 DIAGNOSIS — Z79899 Other long term (current) drug therapy: Secondary | ICD-10-CM | POA: Insufficient documentation

## 2024-03-08 DIAGNOSIS — Z952 Presence of prosthetic heart valve: Secondary | ICD-10-CM

## 2024-03-08 DIAGNOSIS — I5032 Chronic diastolic (congestive) heart failure: Secondary | ICD-10-CM | POA: Diagnosis present

## 2024-03-08 DIAGNOSIS — I10 Essential (primary) hypertension: Secondary | ICD-10-CM | POA: Diagnosis not present

## 2024-03-08 DIAGNOSIS — I110 Hypertensive heart disease with heart failure: Secondary | ICD-10-CM | POA: Insufficient documentation

## 2024-03-08 LAB — CUP PACEART REMOTE DEVICE CHECK
Battery Remaining Longevity: 24 mo
Battery Remaining Percentage: 24 %
Brady Statistic RA Percent Paced: 1 %
Brady Statistic RV Percent Paced: 79 %
Date Time Interrogation Session: 20260108120900
Implantable Lead Connection Status: 753985
Implantable Lead Connection Status: 753985
Implantable Lead Implant Date: 20170417
Implantable Lead Implant Date: 20170417
Implantable Lead Location: 753859
Implantable Lead Location: 753860
Implantable Lead Model: 7740
Implantable Lead Model: 7741
Implantable Lead Serial Number: 662696
Implantable Lead Serial Number: 751382
Implantable Pulse Generator Implant Date: 20170417
Lead Channel Impedance Value: 695 Ohm
Lead Channel Impedance Value: 702 Ohm
Lead Channel Setting Pacing Amplitude: 2.5 V
Lead Channel Setting Pacing Amplitude: 2.5 V
Lead Channel Setting Pacing Pulse Width: 0.4 ms
Lead Channel Setting Sensing Sensitivity: 2.5 mV
Pulse Gen Serial Number: 718418
Zone Setting Status: 755011

## 2024-03-08 MED ORDER — SERTRALINE HCL 50 MG PO TABS
ORAL_TABLET | ORAL | 6 refills | Status: AC
  Filled 2024-03-08: qty 30, 30d supply, fill #0
  Filled 2024-03-22 – 2024-04-04 (×2): qty 30, 30d supply, fill #1

## 2024-03-08 NOTE — Progress Notes (Addendum)
 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  Referred by Dr. Wonda for further evaluation of his HF.   Cardiac catheterization Jul 06, 2022 showed patency of the stents in the left circumflex and RCA with no obstructive disease as well as normal LVEDP.  Echocardiogram May 12, 2023 EF of 55-60%, severe left atrial enlargement, mild mitral regurgitation, normal function of the patient's bioprosthetic aortic valve, and no significant changes from the prior echo study.  Echo 3/25: EF 55-60% RV ok RVSP   He is here with his nurse, Luke. Has been following with Dr. Wonda for chronic post-prandial angina with radiation down left arm. Recently started Imdur  with improvement. BP runs low at home but has severe white-coat HTN. Gets period of tachypnea which can occur at anytime. Did not improve with lasix . Minimal LE edema. Feels weak and limited by exertional angina and anxiety so doesn't do much exercise.   ReDS 26%    Past Medical History:  Diagnosis Date   Aortic stenosis    moderate aortic stenosis   Arthritis    Benign prostatic hypertrophy    Chronic systolic heart failure (HCC)    Chronotropic incompetence with sinus node dysfunction    Status post Guidant dual-mode, dual-pacing, dual-sensing  pacemaker   implantation now programmed to AAI with recent generator change.   Coronary artery disease    status post multiple prior percutaneous coronary interventions, microvascular angina per Dr Obie   Diverticulitis sigmoid colon recurrent    Dysfunctional autonomic  nervous system    Dyspnea    Heart murmur    History of primary hypertension    Hypercoagulable state    chronically anticoagulated with coumadin    Hyperlipidemia    Hyperthyroidism    Hypothyroidism    Dr. Mirna   MGUS (monoclonal gammopathy of unknown significance) 02/17/2013   Ocular myasthenia    Osteoarthritis    Paroxysmal atrial fibrillation (HCC)    DR. Morris,    Prediabetes 09/21/2017   Stroke (HCC)    1990    Current Outpatient Medications  Medication Sig Dispense Refill   amLODipine  (NORVASC ) 2.5 MG tablet Take 2.5 mg by mouth daily.     amLODipine  (NORVASC ) 5 MG tablet Take 1 tablet (5 mg total) by mouth daily. 90 tablet 3   apixaban  (ELIQUIS ) 5 MG TABS tablet Take 1 tablet (5 mg total) by mouth 2 (two) times daily. 180 tablet 1   butalbital -acetaminophen -caffeine  (BAC, BUTALBITAL -ACETAMIN-CAFF,) 50-325-40 MG tablet Take 1 tablet by mouth 3 (three) times daily as needed. (Patient taking differently: Take 1 tablet by mouth 2 (two) times daily.) 90 tablet 2   cholecalciferol  (VITAMIN D3) 25 MCG (1000 UNIT) tablet Take 1,000 Units by mouth daily.     cilostazol  (PLETAL ) 100 MG tablet Take 1 tablet (100 mg total) by mouth 2 (two) times daily. 180 tablet 1   Coenzyme Q10 200 MG TABS Take 200 mg by mouth daily.     desoximetasone  (TOPICORT ) 0.25 % cream Apply 1 Application topically 2 (two) times daily. (Patient taking differently: Apply 1 Application topically as needed.) 60 g 1   diclofenac Sodium (  VOLTAREN) 1 % GEL Apply 2 g topically 2 (two) times daily as needed (knee pain).     digoxin  (LANOXIN ) 0.125 MG tablet Take 1 tablet (0.125 mg total) by mouth daily. 90 tablet 3   doxazosin  (CARDURA ) 4 MG tablet Take 1 tablet (4 mg total) by mouth daily. 90 tablet 3   ezetimibe  (ZETIA ) 10 MG tablet Take 1 tablet (10 mg total) by mouth daily. 90 tablet 3   furosemide  (LASIX ) 20 MG tablet Take 20 mg by mouth daily as needed for fluid.     HYDROcodone -acetaminophen   (NORCO/VICODIN) 5-325 MG tablet Take 1 tablet by mouth 3 (three) times daily as directed 90 tablet 0   HYDROcodone -acetaminophen  (NORCO/VICODIN) 5-325 MG tablet Take 1 tablet by mouth 3 (three) times daily as directed 90 tablet 0   hydrocortisone  (ANUSOL -HC) 25 MG suppository Place 1 suppository (25 mg total) rectally at bedtime as needed for hemorrhoids or anal itching. 12 suppository 1   isosorbide  mononitrate (IMDUR ) 30 MG 24 hr tablet Take 0.5 tablets (15 mg total) by mouth daily. 60 tablet 3   lansoprazole  (PREVACID ) 30 MG capsule Take 1 capsule (30 mg total) by mouth daily at 12 noon. 30 capsule 11   linaclotide  (LINZESS ) 145 MCG CAPS capsule Take 1 capsule (145 mcg total) by mouth daily before breakfast. 90 capsule 3   LORazepam  (ATIVAN ) 1 MG tablet Take 1 tablet (1 mg) by mouth at bedtime, and 0.5 tablet (0.5 mg) daily as needed for anxiety. 60 tablet 5   magnesium  oxide (MAG-OX) 400 MG tablet Take 1 tablet (400 mg total) by mouth daily. 120 tablet 1   metoprolol  succinate (TOPROL  XL) 25 MG 24 hr tablet Take 3 tablets (75 mg total) by mouth 2 (two) times daily. (Patient taking differently: Take 50 mg by mouth 2 (two) times daily.) 540 tablet 1   metoprolol  succinate (TOPROL -XL) 50 MG 24 hr tablet Take 50 mg by mouth daily. Take with or immediately following a meal.     Multiple Vitamins-Minerals (MULTIVITAMIN WITH MINERALS) tablet Take 1 tablet by mouth daily.     nitroGLYCERIN  (NITROSTAT ) 0.4 MG SL tablet Place 1 tablet (0.4 mg total) under the tongue every 5 (five) minutes as needed for chest pain. 25 tablet 5   Polyethyl Glycol-Propyl Glycol (SYSTANE OP) Place 1 drop into both eyes daily as needed (dry eyes).     polyethylene glycol (MIRALAX  / GLYCOLAX ) 17 g packet Take 17 g by mouth daily. Patient taking 1-2 everyday     potassium chloride  SA (KLOR-CON  M) 20 MEQ tablet Take 1 tablet (20 mEq total) by mouth daily. 90 tablet 3   predniSONE  (DELTASONE ) 5 MG tablet Take 1 tablet (5 mg total)  by mouth daily with breakfast. 90 tablet 1   rosuvastatin  (CRESTOR ) 20 MG tablet Take 1 tablet (20 mg total) by mouth at bedtime. Please keep scheduled appointment for future refills. 90 tablet 3   senna (SENOKOT) 8.6 MG tablet Take 1 tablet by mouth as needed for constipation.     telmisartan  (MICARDIS ) 20 MG tablet Take 1 tablet (20 mg total) by mouth daily. (Patient not taking: Reported on 03/08/2024) 90 tablet 3   No current facility-administered medications for this encounter.    Allergies[1]    Social History   Socioeconomic History   Marital status: Married    Spouse name: Alisa    Number of children: 3   Years of education: Not on file   Highest education level: Not on file  Occupational  History   Occupation: Retired    Comment: Physician  Tobacco Use   Smoking status: Never   Smokeless tobacco: Never  Vaping Use   Vaping status: Never Used  Substance and Sexual Activity   Alcohol use: No    Alcohol/week: 0.0 standard drinks of alcohol   Drug use: No   Sexual activity: Not Currently  Other Topics Concern   Not on file  Social History Narrative   Married to [employer]. Has grown children   Retired proofreader MD      Never smoker no alcohol      Social Drivers of Health   Tobacco Use: Low Risk (03/08/2024)   Patient History    Smoking Tobacco Use: Never    Smokeless Tobacco Use: Never    Passive Exposure: Not on file  Financial Resource Strain: Not on file  Food Insecurity: No Food Insecurity (01/08/2023)   Hunger Vital Sign    Worried About Running Out of Food in the Last Year: Never true    Ran Out of Food in the Last Year: Never true  Transportation Needs: No Transportation Needs (01/08/2023)   PRAPARE - Administrator, Civil Service (Medical): No    Lack of Transportation (Non-Medical): No  Physical Activity: Not on file  Stress: Not on file  Social Connections: Not on file  Intimate Partner Violence: Not At Risk (01/08/2023)   Humiliation, Afraid,  Rape, and Kick questionnaire    Fear of Current or Ex-Partner: No    Emotionally Abused: No    Physically Abused: No    Sexually Abused: No  Depression (PHQ2-9): Medium Risk (01/31/2024)   Depression (PHQ2-9)    PHQ-2 Score: 10  Alcohol Screen: Not on file  Housing: Low Risk (01/08/2023)   Housing    Last Housing Risk Score: 0  Utilities: Not At Risk (01/08/2023)   AHC Utilities    Threatened with loss of utilities: No  Health Literacy: Not on file      Family History  Problem Relation Age of Onset   Heart disease Brother        Twin brother has coronary disease and recent AVR for AS   CAD Brother    Atrial fibrillation Brother    Lymphoma Brother    Sarcoidosis Brother    Depression Daughter    Anorexia nervosa Daughter    Hypertension Son    Anesthesia problems Neg Hx    Hypotension Neg Hx    Malignant hyperthermia Neg Hx    Pseudochol deficiency Neg Hx     Vitals:   03/08/24 1151  BP: (!) 190/90  Pulse: 65  SpO2: 97%  Weight: 95.5 kg (210 lb 9.6 oz)    PHYSICAL EXAM: General: Elderly No respiratory difficulty HEENT: normal Neck: supple. no JVD. Carotids 2+ bilat; no bruits. No lymphadenopathy or thryomegaly appreciated. Rnm:pmmzhlojm rate & rhythm. 2/6 SEM Lungs: clear Abdomen: soft, nontender, nondistended. No hepatosplenomegaly. No bruits or masses. Good bowel sounds. Extremities: no cyanosis, clubbing, rash, edema Neuro: alert & oriented x 3, cranial nerves grossly intact. moves all 4 extremities w/o difficulty. Affect pleasant.  ECG: AF with RV pacing Personally reviewed   ASSESSMENT & PLAN:   Chronic Heart failure with preserved ejection fraction (HFpEF) - Echo 3/25: EF 55-60% RV ok RVSP  - NYHA III - Volume looks great. ReDS 26% - can consider spiro as needed  Chronic atrial fibrillation (HCC) - rate controlled on Eliquis   Coronary artery disease involving native coronary artery  of native heart with stableangina pectoris - continues  with chronic angina - managed by Dr. Wonda - Improved with Imdur  - Refer to CR  S/P AVR (aortic valve replacement) - Normal function of his aortic valve prosthesis on most recent echo.  LVEF is normal at 55 to 60%, RV function is mildly reduced, estimated RVSP is 33 mmHg, and the mean transaortic gradient is 9 mmHg.  Hypertension with severe white-coat component - BP runs low  Severe anxiety - suspect significant contributor to symptoms - add sertraline  25 mg (1/2 tab) Daily for 1 week, then increase to 50 mg (1 tab) Daily  Physical deconditioning - refer to CR    Overall I think his HF is optimized suspect functional limitation is multifactorial as above. Will start SSRI and refer to CR. I will see back in several months to check on his progress  Toribio Fuel, MD  12:31 PM      [1]  Allergies Allergen Reactions   Contrast Media [Iodinated Contrast Media] Hives   Gadolinium Derivatives Hives   Iodine-131 Hives   Metrizamide Hives   Tetanus Toxoid-Containing Vaccines     Redness    "

## 2024-03-08 NOTE — Progress Notes (Signed)
"   ReDS Vest / Clip - 03/08/24 1200       ReDS Vest / Clip   Station Marker D    Ruler Value 34    ReDS Value Range Low volume    ReDS Actual Value 26          "

## 2024-03-08 NOTE — Patient Instructions (Signed)
 Medication Changes:  START Zoloft  25 mg (1/2 tab) Daily for 1 week, then increase to 50 mg (1 tab) Daily   Referrals:  You have been referred to Cardiac Rehab, they will call you to schedule  Special Instructions // Education:  Do the following things EVERYDAY: Weigh yourself in the morning before breakfast. Write it down and keep it in a log. Take your medicines as prescribed Eat low salt foods--Limit salt (sodium) to 2000 mg per day.  Stay as active as you can everyday Limit all fluids for the day to less than 2 liters   Follow-Up in: 4 months (May) **PLEASE CALL OUR OFFICE IN MARCH TO SCHEDULE THIS APPOINTMENT    At [facility], you and your health needs are our priority. We have a designated team specialized in the treatment of Heart Failure. This Care Team includes your primary Heart Failure Specialized Cardiologist (physician), Advanced Practice Providers (APPs- Physician Assistants and Nurse Practitioners), and Pharmacist who all work together to provide you with the care you need, when you need it.   You may see any of the following providers on your designated Care Team at your next follow up:  Dr. Toribio Fuel Dr. Ezra Shuck Dr. Odis Brownie Greig Mosses, NP Caffie Shed, [place] [facility] [place], [place] Beckey Coe, NP Jordan Lee, NP Tinnie Redman, PharmD   Please be sure to bring in all your medications bottles to every appointment.   Need to Contact Us :  If you have any questions or concerns before your next appointment please send us  a message through [place] or call our office at [phone].    TO LEAVE A MESSAGE FOR THE NURSE SELECT OPTION 2, PLEASE LEAVE A MESSAGE INCLUDING: YOUR NAME DATE OF BIRTH CALL BACK NUMBER REASON FOR CALL**this is important as we prioritize the call backs  YOU WILL RECEIVE A CALL BACK THE SAME DAY AS LONG AS YOU CALL BEFORE 4:00 PM

## 2024-03-09 ENCOUNTER — Ambulatory Visit (INDEPENDENT_AMBULATORY_CARE_PROVIDER_SITE_OTHER)

## 2024-03-09 DIAGNOSIS — I4819 Other persistent atrial fibrillation: Secondary | ICD-10-CM

## 2024-03-10 ENCOUNTER — Other Ambulatory Visit (HOSPITAL_BASED_OUTPATIENT_CLINIC_OR_DEPARTMENT_OTHER): Payer: Self-pay

## 2024-03-12 ENCOUNTER — Other Ambulatory Visit: Payer: Self-pay

## 2024-03-12 ENCOUNTER — Other Ambulatory Visit (HOSPITAL_BASED_OUTPATIENT_CLINIC_OR_DEPARTMENT_OTHER): Payer: Self-pay

## 2024-03-12 NOTE — Progress Notes (Signed)
 Remote PPM Transmission

## 2024-03-13 ENCOUNTER — Other Ambulatory Visit (HOSPITAL_BASED_OUTPATIENT_CLINIC_OR_DEPARTMENT_OTHER): Payer: Self-pay

## 2024-03-13 ENCOUNTER — Ambulatory Visit: Payer: Self-pay | Admitting: Student in an Organized Health Care Education/Training Program

## 2024-03-13 MED ORDER — HYDROCODONE-ACETAMINOPHEN 5-325 MG PO TABS
1.0000 | ORAL_TABLET | Freq: Three times a day (TID) | ORAL | 0 refills | Status: AC
  Filled 2024-03-22: qty 90, 30d supply, fill #0

## 2024-03-13 MED ORDER — BUTALBITAL-APAP-CAFFEINE 50-325-40 MG PO TABS: 1.0000 | ORAL_TABLET | Freq: Three times a day (TID) | ORAL | 2 refills | Status: AC

## 2024-03-14 ENCOUNTER — Other Ambulatory Visit (HOSPITAL_BASED_OUTPATIENT_CLINIC_OR_DEPARTMENT_OTHER): Payer: Self-pay

## 2024-03-14 ENCOUNTER — Encounter (HOSPITAL_COMMUNITY): Payer: Self-pay

## 2024-03-14 ENCOUNTER — Telehealth (HOSPITAL_COMMUNITY): Payer: Self-pay

## 2024-03-14 NOTE — Telephone Encounter (Signed)
 Attempted to call patient in regards to Cardiac Rehab - LM on VM Mailed letter

## 2024-03-14 NOTE — Telephone Encounter (Signed)
 Office referral received for Cardiac rehab.SABRA

## 2024-03-15 ENCOUNTER — Telehealth (HOSPITAL_COMMUNITY): Payer: Self-pay

## 2024-03-15 NOTE — Telephone Encounter (Signed)
 Pt insurance is active and benefits verified through Medicare a/b Co-pay 0, DED $283/0 met, out of pocket 0/0 met, co-insurance 20%. no pre-authorization required. Passport, 03/15/2024@12 :66, REF# [id number]  2ndary insurance is active and benefits verified through [employer] Supplement. Co-pay 0, DED 0/0 met, out of pocket 0/0 met, co-insurance 0%. No pre-authorization required.   TCR/ICR? ICR Visit(date of service)limitation? 36 Can multiple codes be used on the same date of service/visit?(IF ITS A LIMIT) yes  Is this a lifetime maximum or an annual maximum? annual Has the member used any of these services to date? no Is there a time limit (weeks/months) on start of program and/or program completion? no

## 2024-03-15 NOTE — Telephone Encounter (Signed)
 Pt returned phone call and is interested in the cardiac rehab program. Will pass referral to nurse navigator for review.

## 2024-03-20 ENCOUNTER — Other Ambulatory Visit (HOSPITAL_BASED_OUTPATIENT_CLINIC_OR_DEPARTMENT_OTHER): Payer: Self-pay

## 2024-03-21 ENCOUNTER — Other Ambulatory Visit: Payer: Self-pay

## 2024-03-22 ENCOUNTER — Other Ambulatory Visit (HOSPITAL_BASED_OUTPATIENT_CLINIC_OR_DEPARTMENT_OTHER): Payer: Self-pay

## 2024-03-22 ENCOUNTER — Other Ambulatory Visit: Payer: Self-pay

## 2024-03-22 ENCOUNTER — Other Ambulatory Visit (HOSPITAL_COMMUNITY): Payer: Self-pay

## 2024-03-23 ENCOUNTER — Other Ambulatory Visit (HOSPITAL_BASED_OUTPATIENT_CLINIC_OR_DEPARTMENT_OTHER): Payer: Self-pay

## 2024-03-23 ENCOUNTER — Other Ambulatory Visit (HOSPITAL_COMMUNITY): Payer: Self-pay

## 2024-04-03 ENCOUNTER — Other Ambulatory Visit: Payer: Self-pay

## 2024-04-04 ENCOUNTER — Ambulatory Visit: Admitting: Gastroenterology

## 2024-04-04 ENCOUNTER — Encounter: Payer: Self-pay | Admitting: Gastroenterology

## 2024-04-04 VITALS — BP 124/70 | HR 60 | Ht 74.0 in | Wt 208.0 lb

## 2024-04-04 DIAGNOSIS — R1319 Other dysphagia: Secondary | ICD-10-CM

## 2024-04-04 DIAGNOSIS — K582 Mixed irritable bowel syndrome: Secondary | ICD-10-CM

## 2024-04-04 DIAGNOSIS — K219 Gastro-esophageal reflux disease without esophagitis: Secondary | ICD-10-CM

## 2024-04-04 NOTE — Patient Instructions (Addendum)
 VISIT SUMMARY:  During your visit, we discussed your worsening postprandial hiccups, intermittent difficulty swallowing, gastroesophageal reflux symptoms, altered bowel habits, and chronic abdominal tenderness. We reviewed your current medications and dietary habits and made adjustments to help manage your symptoms.  YOUR PLAN:  DIVERTICULAR DISEASE OF THE COLON WITH CHRONIC ABDOMINAL PAIN: You have chronic left lower quadrant abdominal pain and tenderness due to diverticular disease. -Maintain loose stool consistency to minimize symptoms and reduce the risk of complications. -Avoid excessive reduction of Miralax  dose, as more formed stools may worsen symptoms. -Monitor for changes in pain or bowel habits and contact the office if symptoms worsen.  CHRONIC CONSTIPATION: Your chronic constipation is well controlled with Miralax , which helps with colonic emptying. -Adjust Miralax  regimen to half scoop twice daily to balance stool consistency and frequency. -Alternate between whole scoop and half scoop on different days if needed. -Monitor bowel movement frequency and consistency, aiming for one to two per day.  CHRONIC HICCUPS: Your postprandial hiccups have worsened, likely due to reflux, swallowed air, and esophageal compression. -Take Zegerid before dinner to reduce reflux and potentially decrease hiccup frequency. -Take simethicone  before dinner to reduce swallowed air and gas. -Eat smaller, softer meals and snacks throughout the day. -Avoid large, dry, or tough foods. -Use Gaviscon at night only if needed for reflux symptoms.  GASTROESOPHAGEAL REFLUX DISEASE: You experience nocturnal reflux every two to three nights, which may contribute to hiccups and esophageal symptoms. -Take Zegerid before dinner daily for the next two to four weeks. -Continue taking lansoprazole  in the morning. -If symptoms improve after two to four weeks, you may reduce lansoprazole  to as-needed use. -Use Gaviscon  at night if reflux persists. -Elevate the head of the bed and maintain a three to four hour interval between dinner and bedtime.  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 -Avoid large pills; break or crush medications when possible. -Eat soft, moist foods and avoid dry meats to reduce the risk of difficulty swallowing. -Consume ground meats and protein drinks if tolerated. -Eat carefully and monitor for signs of aspiration or worsening difficulty swallowing. -Contact the office if symptoms worsen or if you experience difficulty swallowing solids or liquids.  I appreciate the  opportunity to care for you  Thank You   Kavitha Nandigam , MD

## 2024-04-04 NOTE — Progress Notes (Unsigned)
 "                Kevin GORMAN Finn, MD    [id number]    May 09, 1934  Primary Care Physician:Gupta, Fredia CROME, MD  Referring Physician: Charlanne Fredia CROME, MD [address] [place],  [place] [address]   Chief complaint: Dysphagia, hiccups  Discussed the use of AI scribe software for clinical note transcription with the patient, who gave verbal consent to proceed.  History of Present Illness Kevin GORMAN Finn, MD [name] is an 89 year old male with severe cardiac disease, atrial arrhythmias, esophageal dysmotility, and diverticular disease who presents for follow-up of worsening postprandial hiccups.  Postprandial Hiccups: - Worsening episodes over the past six weeks, occurring after dinner - Typically begin after eating approximately 25% of a meal - Last about ten minutes per episode - Increased frequency and severity, described as a lot and difficult to stop - Occur up to seven to nine times over a few days, more prominent after larger or more solid meals  Dysphagia: - Intermittent difficulty swallowing, especially with solid foods such as turkey or meat - Sensation of big balls in the throat and difficulty swallowing pills - Occasional obstruction with morning medications, increased salivation, and transient shortness of breath - Spacing out pills and taking fewer at a time improves symptoms - Avoids dry meats, prefers ground meats or softer foods - Aware of large left atrium contributing to esophageal dysmotility  Gastroesophageal Reflux Symptoms: - Longstanding reflux symptoms occurring every two to three nights - Frequently wakes with sour taste in mouth - Uses Zegerid at bedtime and lansoprazole  in the morning - Lozenges used to aid return to sleep - Head-of-bed elevation provides relief - Dinner at 6:00 PM, bedtime at 9:30 PM - No anxiety related to symptoms  Altered Bowel Habits: - Daily diarrhea for the past two months, typically one loose and sometimes explosive bowel  movement per day - Miralax  taken once daily, with dose adjustments attempted - More formed stool results in difficulty with evacuation - No more than one bowel movement per day - Able to reach bathroom except for one occasion - No association between diarrhea and other symptoms  Dietary Intake and Weight Changes: - Snacks and small meals consumed throughout the day - Minimal breakfast or lunch - Tolerates [place] bars and occasional protein shakes - Some weight loss noted, but overall feels well  Abdominal Tenderness: - Chronic left lower quadrant tenderness at site of known diverticula - Currently stable - Less discomfort in the gastrointestinal tract recently   EGD November 10, 2023 - Z- line regular, 44 cm from the incisors. - Gastroesophageal flap valve classified as Hill Grade II ( fold present, opens with respiration) . - No endoscopic esophageal abnormality to explain patient' s dysphagia. - Normal stomach. - Normal examined duodenum.   CT Angio Abdomen Pelvis w/wo contrast 12-13-22 -VASCULAR Atherosclerosis of abdominal aorta is noted without aneurysm or dissection. No significant mesenteric or renal artery stenosis is noted. Aortic Atherosclerosis (ICD10-I70.0). -NON-VASCULAR Diverticulosis of descending and sigmoid colon is noted. Minimal inflammatory changes are noted involving the proximal sigmoid colon which were present on prior exam and may represent chronic sequela of previous inflammation, although acute inflammation cannot be excluded.   Stable moderate prostatic enlargement.   CT abdomen pelvis November 05, 2022 without contrast 1. Minimal stranding in the proximal sigmoid colon compatible with acute diverticulitis. No free air or free fluid is present. 2. Cholelithiasis without evidence of cholecystitis. 3. Stable  5 cm simple cyst at the lower pole of the right kidney. No follow-up imaging is recommended. JACR 2018 Feb; 264-273, Management of the  Incidental Renal Mass on CT, Radio[employer] 2021; 814-848, Bosniak Classification of Cystic Renal Masses, Version 2019. 4. Cardiomegaly without failure. 5. Coronary artery disease. 6.  Aortic Atherosclerosis (ICD10-I70.0).   CT abdomen pelvis without contrast September 30, 2022 1. There is mild bowel wall thickening and faint adjacent fat stranding at the confluence of the sigmoid colon and descending colon. This could reflect early acute uncomplicated diverticulitis. 2. Bladder wall is circumferentially thickened with a trabeculated appearance. This could reflect sequela of chronic outlet obstruction but given faint adjacent fat stranding, recommend correlation with urinalysis to exclude superimposed infection. 3. Massive prostatomegaly.   CT Abdomen Pelvis w contrast 08-12-21  1. No acute CT findings of the abdomen or pelvis to explain lower abdominal pain. 2. Descending and sigmoid diverticulosis without evidence of acute diverticulitis. 3. Severe prostatomegaly. 4. Tiny gallstones and or sludge in the gallbladder. No evidence of acute cholecystitis. 5. Coronary artery disease.   CT Abdomen Pelvis wo contrast 10-18-19 -Mild to moderate acute sigmoid diverticulitis. No evidence of abscess or other complication. -Stable enlarged prostate and findings of chronic bladder outlet obstruction.  -Cholelithiasis. No radiographic evidence of cholecystitis.   Outpatient Encounter Medications as of 04/04/2024  Medication Sig   amLODipine  (NORVASC ) 2.5 MG tablet Take 2.5 mg by mouth daily.   amLODipine  (NORVASC ) 5 MG tablet Take 1 tablet (5 mg total) by mouth daily.   apixaban  (ELIQUIS ) 5 MG TABS tablet Take 1 tablet (5 mg total) by mouth 2 (two) times daily.   butalbital -acetaminophen -caffeine  (BAC, BUTALBITAL -ACETAMIN-CAFF,) 50-325-40 MG tablet Take 1 tablet by mouth 3 (three) times daily as needed. (Patient taking differently: Take 1 tablet by mouth 2 (two) times daily.)    butalbital -acetaminophen -caffeine  (BAC, BUTALBITAL -ACETAMIN-CAFF,) 50-325-40 MG tablet Take 1 tablet by mouth 3 (three) times daily  as needed pt aware of acetaminophen  in Norco as well   cholecalciferol  (VITAMIN D3) 25 MCG (1000 UNIT) tablet Take 1,000 Units by mouth daily.   cilostazol  (PLETAL ) 100 MG tablet Take 1 tablet (100 mg total) by mouth 2 (two) times daily.   Coenzyme Q10 200 MG TABS Take 200 mg by mouth daily.   diclofenac Sodium (VOLTAREN) 1 % GEL Apply 2 g topically 2 (two) times daily as needed (knee pain).   digoxin  (LANOXIN ) 0.125 MG tablet Take 1 tablet (0.125 mg total) by mouth daily.   doxazosin  (CARDURA ) 4 MG tablet Take 1 tablet (4 mg total) by mouth daily.   ezetimibe  (ZETIA ) 10 MG tablet Take 1 tablet (10 mg total) by mouth daily.   furosemide  (LASIX ) 20 MG tablet Take 20 mg by mouth daily as needed for fluid.   HYDROcodone -acetaminophen  (NORCO/VICODIN) 5-325 MG tablet Take 1 tablet by mouth 3 (three) times daily as directed   HYDROcodone -acetaminophen  (NORCO/VICODIN) 5-325 MG tablet Take 1 tablet by mouth 3 (three) times daily as directed   HYDROcodone -acetaminophen  (NORCO/VICODIN) 5-325 MG tablet Take 1 tablet by mouth 3 (three) times daily as directed   hydrocortisone  (ANUSOL -HC) 25 MG suppository Place 1 suppository (25 mg total) rectally at bedtime as needed for hemorrhoids or anal itching.   isosorbide  mononitrate (IMDUR ) 30 MG 24 hr tablet Take 0.5 tablets (15 mg total) by mouth daily.   lansoprazole  (PREVACID ) 30 MG capsule Take 1 capsule (30 mg total) by mouth daily at 12 noon.   linaclotide  (LINZESS ) 145 MCG CAPS capsule Take 1 capsule (  145 mcg total) by mouth daily before breakfast.   LORazepam  (ATIVAN ) 1 MG tablet Take 1 tablet (1 mg) by mouth at bedtime, and 0.5 tablet (0.5 mg) daily as needed for anxiety.   magnesium  oxide (MAG-OX) 400 MG tablet Take 1 tablet (400 mg total) by mouth daily.   metoprolol  succinate (TOPROL  XL) 25 MG 24 hr tablet Take 3 tablets (75  mg total) by mouth 2 (two) times daily. (Patient taking differently: Take 50 mg by mouth 2 (two) times daily.)   metoprolol  succinate (TOPROL -XL) 50 MG 24 hr tablet Take 50 mg by mouth daily. Take with or immediately following a meal.   Multiple Vitamins-Minerals (MULTIVITAMIN WITH MINERALS) tablet Take 1 tablet by mouth daily.   nitroGLYCERIN  (NITROSTAT ) 0.4 MG SL tablet Place 1 tablet (0.4 mg total) under the tongue every 5 (five) minutes as needed for chest pain.   Polyethyl Glycol-Propyl Glycol (SYSTANE OP) Place 1 drop into both eyes daily as needed (dry eyes).   polyethylene glycol (MIRALAX  / GLYCOLAX ) 17 g packet Take 17 g by mouth daily. Patient taking 1-2 everyday   potassium chloride  SA (KLOR-CON  M) 20 MEQ tablet Take 1 tablet (20 mEq total) by mouth daily.   predniSONE  (DELTASONE ) 5 MG tablet Take 1 tablet (5 mg total) by mouth daily with breakfast.   rosuvastatin  (CRESTOR ) 20 MG tablet Take 1 tablet (20 mg total) by mouth at bedtime. Please keep scheduled appointment for future refills.   senna (SENOKOT) 8.6 MG tablet Take 1 tablet by mouth as needed for constipation.   sertraline  (ZOLOFT ) 50 MG tablet Take 1/2 tab daily for 1 week, then take 1 tablet daily   telmisartan  (MICARDIS ) 20 MG tablet Take 1 tablet (20 mg total) by mouth daily. (Patient not taking: Reported on 03/08/2024)   No facility-administered encounter medications on file as of 04/04/2024.    Allergies as of 04/04/2024 - Review Complete 03/08/2024  Allergen Reaction Noted   Contrast media [iodinated contrast media] Hives 02/01/2011   Gadolinium derivatives Hives 10/09/2012   Iodine-131 Hives 10/07/2021   Metrizamide Hives 02/01/2011   Tetanus toxoid-containing vaccines  10/07/2021    Past Medical History:  Diagnosis Date   Aortic stenosis    moderate aortic stenosis   Arthritis    Benign prostatic hypertrophy    Chronic systolic heart failure (HCC)    Chronotropic incompetence with sinus node dysfunction     Status post Guidant dual-mode, dual-pacing, dual-sensing  pacemaker   implantation now programmed to AAI with recent generator change.   Coronary artery disease    status post multiple prior percutaneous coronary interventions, microvascular angina per Dr Obie   Diverticulitis sigmoid colon recurrent    Dysfunctional autonomic nervous system    Dyspnea    Heart murmur    History of primary hypertension    Hypercoagulable state    chronically anticoagulated with coumadin    Hyperlipidemia    Hyperthyroidism    Hypothyroidism    Dr. Mirna   MGUS (monoclonal gammopathy of unknown significance) 02/17/2013   Ocular myasthenia    Osteoarthritis    Paroxysmal atrial fibrillation (HCC)    DR. Morris,    Prediabetes 09/21/2017   Stroke (HCC)    1990    Past Surgical History:  Procedure Laterality Date   AORTIC VALVE REPLACEMENT  03/15/2011   Procedure: AORTIC VALVE REPLACEMENT (AVR);  Surgeon: Dorise MARLA Fellers, MD;  Location: [facility] OR;  Service: Open Heart Surgery;  Laterality: N/A;   APPENDECTOMY  CARDIAC CATHETERIZATION     13   CARDIOVERSION     CARDIOVERSION  04/15/2011   Procedure: CARDIOVERSION;  Surgeon: Ezra Shuck, MD;  Location: [facility] ENDOSCOPY;  Service: Cardiovascular;  Laterality: N/A;   CARDIOVERSION N/A 09/11/2014   Procedure: CARDIOVERSION;  Surgeon: Wilbert JONELLE Bihari, MD;  Location: [facility] ENDOSCOPY;  Service: Cardiovascular;  Laterality: N/A;   CARDIOVERSION N/A 06/27/2015   Procedure: CARDIOVERSION;  Surgeon: Aleene JINNY Passe, MD;  Location: [facility] ENDOSCOPY;  Service: Cardiovascular;  Laterality: N/A;   CARDIOVERSION N/A 07/04/2015   Procedure: CARDIOVERSION;  Surgeon: Danelle LELON Birmingham, MD;  Location: [facility] ENDOSCOPY;  Service: Cardiovascular;  Laterality: N/A;   CARDIOVERSION N/A 04/13/2017   Procedure: CARDIOVERSION;  Surgeon: Bihari Wilbert JONELLE, MD;  Location: [facility] ENDOSCOPY;  Service: Cardiovascular;  Laterality: N/A;   COLONOSCOPY     CORONARY STENT INTERVENTION N/A 10/15/2019    Procedure: CORONARY STENT INTERVENTION;  Surgeon: Dann Candyce RAMAN, MD;  Location: [facility] INVASIVE CV LAB;  Service: Cardiovascular;  Laterality: N/A;   EP IMPLANTABLE DEVICE N/A 06/16/2015   Procedure: Pacemaker Implant;  Surgeon: Danelle LELON Birmingham, MD;  Location: [facility] INVASIVE CV LAB;  Service: Cardiovascular;  Laterality: N/A;   ESOPHAGOGASTRODUODENOSCOPY N/A 11/10/2023   Procedure: EGD (ESOPHAGOGASTRODUODENOSCOPY);  Surgeon: Berdia Lachman V, MD;  Location: [facility] ENDOSCOPY;  Service: Gastroenterology;  Laterality: N/A;   hemrrhoidectomy     LEFT AND RIGHT HEART CATHETERIZATION WITH CORONARY ANGIOGRAM Bilateral 02/01/2011   Procedure: LEFT AND RIGHT HEART CATHETERIZATION WITH CORONARY ANGIOGRAM;  Surgeon: Debby JONETTA Como, MD;  Location: [facility] CATH LAB;  Service: Cardiovascular;  Laterality: Bilateral;   LEFT HEART CATH AND CORONARY ANGIOGRAPHY N/A 10/15/2019   Procedure: LEFT HEART CATH AND CORONARY ANGIOGRAPHY;  Surgeon: Dann Candyce RAMAN, MD;  Location: [facility] INVASIVE CV LAB;  Service: Cardiovascular;  Laterality: N/A;   LEFT HEART CATH AND CORONARY ANGIOGRAPHY N/A 10/07/2020   Procedure: LEFT HEART CATH AND CORONARY ANGIOGRAPHY;  Surgeon: Wonda Sharper, MD;  Location: [facility] INVASIVE CV LAB;  Service: Cardiovascular;  Laterality: N/A;   LEFT HEART CATH AND CORONARY ANGIOGRAPHY N/A 07/06/2022   Procedure: LEFT HEART CATH AND CORONARY ANGIOGRAPHY;  Surgeon: Wonda Sharper, MD;  Location: [facility] INVASIVE CV LAB;  Service: Cardiovascular;  Laterality: N/A;   LUMBAR LAMINECTOMY/DECOMPRESSION MICRODISCECTOMY Right 02/23/2021   Procedure: Right Lumbar four-five microdiscectomy;  Surgeon: Joshua Alm RAMAN, MD;  Location: [facility] OR;  Service: Neurosurgery;  Laterality: Right;   MAZE  03/15/2011   Procedure: MAZE;  Surgeon: Dorise MARLA Fellers, MD;  Location: [facility] OR;  Service: Open Heart Surgery;  Laterality: N/A;   MOHS SURGERY  09/2021   PACEMAKER INSERTION  1991   Guidant PPM, most recent Generator Change by Dr Obie  was 08/22/06   RIGHT/LEFT HEART CATH AND CORONARY ANGIOGRAPHY N/A 07/06/2016   Procedure: Right/Left Heart Cath and Coronary Angiography;  Surgeon: Wonda Sharper, MD;  Location: [facility] INVASIVE CV LAB;  Service: Cardiovascular;  Laterality: N/A;   TEE WITHOUT CARDIOVERSION  04/15/2011   Procedure: TRANSESOPHAGEAL ECHOCARDIOGRAM (TEE);  Surgeon: Ezra Shuck, MD;  Location: [facility] ENDOSCOPY;  Service: Cardiovascular;  Laterality: N/A;   TEE WITHOUT CARDIOVERSION N/A 09/11/2014   Procedure: TRANSESOPHAGEAL ECHOCARDIOGRAM (TEE);  Surgeon: Wilbert JONELLE Bihari, MD;  Location: [facility] ENDOSCOPY;  Service: Cardiovascular;  Laterality: N/A;    Family History  Problem Relation Age of Onset   Heart disease Brother        Twin brother has coronary disease and recent AVR for AS   CAD Brother    Atrial  fibrillation Brother    Lymphoma Brother    Sarcoidosis Brother    Depression Daughter    Anorexia nervosa Daughter    Hypertension Son    Anesthesia problems Neg Hx    Hypotension Neg Hx    Malignant hyperthermia Neg Hx    Pseudochol deficiency Neg Hx     Social History   Socioeconomic History   Marital status: Married    Spouse name: Alisa    Number of children: 3   Years of education: Not on file   Highest education level: Not on file  Occupational History   Occupation: Retired    Comment: Physician  Tobacco Use   Smoking status: Never   Smokeless tobacco: Never  Vaping Use   Vaping status: Never Used  Substance and Sexual Activity   Alcohol use: No    Alcohol/week: 0.0 standard drinks of alcohol   Drug use: No   Sexual activity: Not Currently  Other Topics Concern   Not on file  Social History Narrative   Married to [place]. Has grown children   Retired proofreader MD      Never smoker no alcohol      Social Drivers of Health   Tobacco Use: Low Risk (03/08/2024)   Patient History    Smoking Tobacco Use: Never    Smokeless Tobacco Use: Never    Passive Exposure: Not on file  Financial  Resource Strain: Not on file  Food Insecurity: No Food Insecurity (01/08/2023)   Hunger Vital Sign    Worried About Running Out of Food in the Last Year: Never true    Ran Out of Food in the Last Year: Never true  Transportation Needs: No Transportation Needs (01/08/2023)   PRAPARE - Administrator, Civil Service (Medical): No    Lack of Transportation (Non-Medical): No  Physical Activity: Not on file  Stress: Not on file  Social Connections: Not on file  Intimate Partner Violence: Not At Risk (01/08/2023)   Humiliation, Afraid, Rape, and Kick questionnaire    Fear of Current or Ex-Partner: No    Emotionally Abused: No    Physically Abused: No    Sexually Abused: No  Depression (PHQ2-9): Medium Risk (01/31/2024)   Depression (PHQ2-9)    PHQ-2 Score: 10  Alcohol Screen: Not on file  Housing: Low Risk (01/08/2023)   Housing    Last Housing Risk Score: 0  Utilities: Not At Risk (01/08/2023)   AHC Utilities    Threatened with loss of utilities: No  Health Literacy: Not on file      Review of systems: All other review of systems negative except as mentioned in the HPI.   Physical Exam: Vitals:   04/04/24 1415  BP: 124/70  Pulse: 60   Body mass index is 26.71 kg/m. Gen:      No acute distress HEENT:  sclera anicteric CV: A-flutter, paced Lungs: B/l clear. Abd:      soft, non-tender; no palpable masses, no distension Ext:    No edema Neuro: alert and oriented x 3 Psych: normal mood and affect  Data Reviewed:  Reviewed labs, radiology imaging, old records and pertinent past GI work up   Assessment & Plan Diverticular disease of the colon with chronic abdominal pain Chronic left lower quadrant abdominal pain and tenderness have shown more discomfort recently. Maintaining loose stool consistency is essential to minimize symptoms and reduce risk of diverticular complications. - Recommended maintaining loose stool consistency. - Advised against excessive  reduction of Miralax  dose, as more formed stools may exacerbate symptoms. - Reinforced monitoring for changes in pain or bowel habits and to contact the office if symptoms worsen.  Chronic constipation Chronic constipation is well controlled with Miralax , resulting in loose stool consistency that benefits colonic emptying. - Adjusted Miralax  regimen to half scoop twice daily to balance stool consistency and frequency. - Advised alternating between whole scoop and half scoop on different days if needed. - Instructed to monitor bowel movement frequency and consistency, aiming for one to two per day.  Chronic hiccups Postprandial hiccups have worsened over the past six weeks, likely multifactorial due to reflux, aerophagia, and esophageal compression. Dietary and medication adjustments may reduce frequency and severity. - Recommended Zegerid before dinner to reduce reflux and potentially decrease hiccup frequency. - Advised simethicone  before dinner to reduce swallowed air and gas. - Encouraged smaller, softer meals and snacks throughout the day. - Advised avoidance of large, dry, or tough foods. - Suggested Gaviscon at night only if needed for reflux symptoms.  Gastroesophageal reflux disease Nocturnal reflux occurs every two to three nights and may contribute to hiccups and esophageal symptoms. Current acid suppression regimen is appropriate, with plans to adjust based on symptom improvement. - Recommended Zegerid before dinner daily for the next two to four weeks. - Continued lansoprazole  in the morning. - Advised that if symptoms improve after two to four weeks, he may reduce lansoprazole  to as-needed use. - Suggested Gaviscon at night if reflux persists. - Reinforced elevating the head of the bed and maintaining a three to four hour interval between dinner and bedtime.  Esophageal dysmotility due to left atrial enlargement Esophageal dysmotility is likely secondary to external compression  from a markedly enlarged left atrium, resulting in intermittent dysphagia and increased salivation. Dietary and medication modifications are necessary to reduce risk of aspiration and worsening symptoms. - Advised avoidance of large pills; instructed to break or crush medications when possible. - Recommended soft, moist foods and avoidance of dry meats to reduce risk of dysphagia. - Encouraged ground meats and protein drinks if tolerated. - Reinforced careful eating and monitoring for signs of aspiration or worsening dysphagia. - Advised to contact the office if symptoms worsen or if he experiences difficulty swallowing solids or liquids.  Recording duration: 40 minutes      This visit required >40 minutes of patient care (this includes precharting, chart review, review of results, face-to-face time used for counseling as well as treatment plan and follow-up. The patient was provided an opportunity to ask questions and all were answered. The patient agreed with the plan and demonstrated an understanding of the instructions.  [name] , MD    CC: Charlanne Fredia CROME, MD    "

## 2024-04-06 ENCOUNTER — Encounter: Payer: Self-pay | Admitting: Gastroenterology

## 2024-06-08 ENCOUNTER — Encounter

## 2024-07-31 ENCOUNTER — Encounter: Admitting: Internal Medicine

## 2024-09-07 ENCOUNTER — Encounter

## 2024-12-07 ENCOUNTER — Encounter

## 2025-03-08 ENCOUNTER — Encounter

## 2025-06-07 ENCOUNTER — Encounter
